# Patient Record
Sex: Male | Born: 1970 | Race: Black or African American | Hispanic: No | Smoking: Current every day smoker
Health system: Southern US, Academic
[De-identification: ages and names within clinical notes are randomized; demographics above are authoritative.]

## PROBLEM LIST (undated history)

## (undated) ENCOUNTER — Emergency Department: Disposition: A | Discharge status: Other Acute Inpatient Hospital | Payer: Medicare Other

## (undated) DIAGNOSIS — I219 Acute myocardial infarction, unspecified: Secondary | ICD-10-CM

## (undated) DIAGNOSIS — N289 Disorder of kidney and ureter, unspecified: Secondary | ICD-10-CM

## (undated) DIAGNOSIS — J45909 Unspecified asthma, uncomplicated: Secondary | ICD-10-CM

## (undated) DIAGNOSIS — R188 Other ascites: Secondary | ICD-10-CM

## (undated) DIAGNOSIS — K529 Noninfective gastroenteritis and colitis, unspecified: Principal | ICD-10-CM

## (undated) DIAGNOSIS — R079 Chest pain, unspecified: Secondary | ICD-10-CM

## (undated) DIAGNOSIS — N186 End stage renal disease: Principal | ICD-10-CM

## (undated) DIAGNOSIS — E875 Hyperkalemia: Secondary | ICD-10-CM

## (undated) DIAGNOSIS — R509 Fever, unspecified: Secondary | ICD-10-CM

## (undated) DIAGNOSIS — J810 Acute pulmonary edema: Principal | ICD-10-CM

## (undated) DIAGNOSIS — A09 Infectious gastroenteritis and colitis, unspecified: Secondary | ICD-10-CM

## (undated) DIAGNOSIS — I249 Acute ischemic heart disease, unspecified: Principal | ICD-10-CM

## (undated) DIAGNOSIS — A498 Other bacterial infections of unspecified site: Secondary | ICD-10-CM

## (undated) DIAGNOSIS — Z765 Malingerer [conscious simulation]: Secondary | ICD-10-CM

## (undated) DIAGNOSIS — A0472 Enterocolitis due to Clostridium difficile, not specified as recurrent: Secondary | ICD-10-CM

## (undated) DIAGNOSIS — G4730 Sleep apnea, unspecified: Secondary | ICD-10-CM

## (undated) DIAGNOSIS — R9430 Abnormal result of cardiovascular function study, unspecified: Secondary | ICD-10-CM

## (undated) DIAGNOSIS — Z992 Dependence on renal dialysis: Secondary | ICD-10-CM

## (undated) DIAGNOSIS — I2510 Atherosclerotic heart disease of native coronary artery without angina pectoris: Secondary | ICD-10-CM

## (undated) DIAGNOSIS — I10 Essential (primary) hypertension: Secondary | ICD-10-CM

## (undated) DIAGNOSIS — I252 Old myocardial infarction: Secondary | ICD-10-CM

## (undated) DIAGNOSIS — Z973 Presence of spectacles and contact lenses: Secondary | ICD-10-CM

## (undated) DIAGNOSIS — Z972 Presence of dental prosthetic device (complete) (partial): Secondary | ICD-10-CM

## (undated) DIAGNOSIS — N19 Unspecified kidney failure: Secondary | ICD-10-CM

## (undated) HISTORY — PX: PERITONEAL CATHETER INSERTION: SHX2223

## (undated) HISTORY — PX: OTHER SURGICAL HISTORY: SHX169

## (undated) HISTORY — PX: HERNIA REPAIR: SHX51

## (undated) HISTORY — PX: ABDOMINAL SURGERY: SHX537

## (undated) HISTORY — PX: AV FISTULA PLACEMENT: SHX1204

## (undated) HISTORY — PX: HX CORONARY STENT PLACEMENT: SHX49

## (undated) HISTORY — PX: CORONARY ARTERY ANGIOPLASTY: PR CATH30428

## (undated) HISTORY — PX: HX HERNIA REPAIR: SHX51

## (undated) HISTORY — PX: HX GALL BLADDER SURGERY/CHOLE: SHX55

## (undated) HISTORY — PX: INGUINAL HERNIA REPAIR: SUR1180

## (undated) HISTORY — PX: DIALYSIS FISTULA CREATION: SHX611

## (undated) HISTORY — PX: EXCHANGE OF A DIALYSIS CATHETER: SHX5818

## (undated) HISTORY — PX: UMBILICAL HERNIA REPAIR: SHX196

---

## 2000-03-31 ENCOUNTER — Observation Stay
Admission: EM | Admit: 2000-03-31 | Disposition: A | Discharge status: Home / Self Care | Payer: Self-pay | Source: Ambulatory Visit | Admitting: Psychiatry

## 2000-07-11 ENCOUNTER — Emergency Department: Admit: 2000-07-11 | Payer: Self-pay | Source: Emergency Department | Admitting: Emergency Medicine

## 2006-04-19 DIAGNOSIS — N19 Unspecified kidney failure: Secondary | ICD-10-CM

## 2006-04-19 HISTORY — DX: Unspecified kidney failure: N19

## 2008-04-19 DIAGNOSIS — I219 Acute myocardial infarction, unspecified: Secondary | ICD-10-CM

## 2008-04-19 HISTORY — DX: Acute myocardial infarction, unspecified: I21.9

## 2008-05-19 ENCOUNTER — Inpatient Hospital Stay
Admission: EM | Admit: 2008-05-19 | Disposition: A | Discharge status: Home / Self Care | Payer: Self-pay | Source: Emergency Department | Admitting: Internal Medicine

## 2008-05-19 LAB — CBC AND DIFFERENTIAL
Basophils Absolute: 0 /mm3 (ref 0.0–0.2)
Basophils: 0 % (ref 0–2)
Eosinophils Absolute: 0.4 /mm3 (ref 0.0–0.7)
Eosinophils: 5 % (ref 0–5)
Granulocytes Absolute: 7.5 /mm3 (ref 1.8–8.1)
Hematocrit: 39.3 % — ABNORMAL LOW (ref 42.0–52.0)
Hgb: 12.7 G/DL — ABNORMAL LOW (ref 13.0–17.0)
Immature Granulocytes Absolute: 0
Immature Granulocytes: 0 %
Lymphocytes Absolute: 1.3 /mm3 (ref 0.5–4.4)
Lymphocytes: 14 % — ABNORMAL LOW (ref 15–41)
MCH: 31.4 PG (ref 28.0–32.0)
MCHC: 32.3 G/DL (ref 32.0–36.0)
MCV: 97 FL (ref 80.0–100.0)
MPV: 11.5 FL (ref 9.4–12.3)
Monocytes Absolute: 0.4 /mm3 (ref 0.0–1.2)
Monocytes: 4 % (ref 0–11)
Neutrophils %: 77 % — ABNORMAL HIGH (ref 52–75)
Platelets: 221 /mm3 (ref 140–400)
RBC: 4.05 /mm3 — ABNORMAL LOW (ref 4.70–6.00)
RDW: 15.4 % — ABNORMAL HIGH (ref 11.5–15.0)
WBC: 9.78 /mm3 (ref 3.50–10.80)

## 2008-05-19 LAB — BASIC METABOLIC PANEL
BUN: 73 mg/dL — ABNORMAL HIGH (ref 8–20)
CO2: 11 mEq/L — CR (ref 21–30)
Calcium: 8.5 mg/dL — ABNORMAL LOW (ref 8.6–10.2)
Chloride: 119 mEq/L — ABNORMAL HIGH (ref 98–107)
Creatinine: 9.9 mg/dL — ABNORMAL HIGH (ref 0.6–1.5)
Glucose: 120 mg/dL — ABNORMAL HIGH (ref 70–100)
Potassium: 6.8 mEq/L — CR (ref 3.6–5.0)
Sodium: 141 mEq/L (ref 136–146)

## 2008-05-19 LAB — HEPATITIS B SURFACE ANTIGEN W/ REFLEX TO CONFIRMATION: Hepatitis B Surface Antigen: NEGATIVE

## 2008-05-19 LAB — B-TYPE NATRIURETIC PEPTIDE: B-Natriuretic Peptide: 325 pg/mL — ABNORMAL HIGH (ref 0–73)

## 2008-05-19 LAB — GFR

## 2008-05-19 LAB — HEPATITIS C ANTIBODY: Hepatitis C, AB: NEGATIVE

## 2008-05-20 LAB — CBC
Hematocrit: 39.2 % — ABNORMAL LOW (ref 42.0–52.0)
Hgb: 12.8 G/DL — ABNORMAL LOW (ref 13.0–17.0)
MCH: 30.6 PG (ref 28.0–32.0)
MCHC: 32.7 G/DL (ref 32.0–36.0)
MCV: 93.8 FL (ref 80.0–100.0)
MPV: 11.3 FL (ref 9.4–12.3)
Platelets: 202 /mm3 (ref 140–400)
RBC: 4.18 /mm3 — ABNORMAL LOW (ref 4.70–6.00)
RDW: 15.4 % — ABNORMAL HIGH (ref 11.5–15.0)
WBC: 12.65 /mm3 — ABNORMAL HIGH (ref 3.50–10.80)

## 2008-05-20 LAB — BASIC METABOLIC PANEL
BUN: 61 mg/dL — ABNORMAL HIGH (ref 8–20)
CO2: 20 mEq/L — ABNORMAL LOW (ref 21–30)
Calcium: 8.6 mg/dL (ref 8.6–10.2)
Chloride: 105 mEq/L (ref 98–107)
Creatinine: 8.1 mg/dL — ABNORMAL HIGH (ref 0.6–1.5)
Glucose: 88 mg/dL (ref 70–100)
Potassium: 4.3 mEq/L (ref 3.6–5.0)
Sodium: 139 mEq/L (ref 136–146)

## 2008-05-20 LAB — PHOSPHORUS: Phosphorus: 7.9 mg/dL — ABNORMAL HIGH (ref 2.5–4.5)

## 2008-05-20 LAB — MAGNESIUM: Magnesium: 1.6 mg/dL (ref 1.6–2.3)

## 2008-05-20 LAB — RPR: RPR: NONREACTIVE

## 2008-05-20 LAB — HEMOGLOBIN A1C: Hemoglobin A1C: 5.1 % (ref ?–6.0)

## 2008-05-21 LAB — CBC
Hematocrit: 37.9 % — ABNORMAL LOW (ref 42.0–52.0)
Hgb: 12.4 G/DL — ABNORMAL LOW (ref 13.0–17.0)
MCH: 30.8 PG (ref 28.0–32.0)
MCHC: 32.7 G/DL (ref 32.0–36.0)
MCV: 94 FL (ref 80.0–100.0)
MPV: 11.3 FL (ref 9.4–12.3)
Platelets: 154 /mm3 (ref 140–400)
RBC: 4.03 /mm3 — ABNORMAL LOW (ref 4.70–6.00)
RDW: 15.2 % — ABNORMAL HIGH (ref 11.5–15.0)
WBC: 9.34 /mm3 (ref 3.50–10.80)

## 2008-05-21 LAB — LIPID PANEL
Cholesterol: 171 mg/dL (ref 50–200)
HDL: 29 mg/dL (ref 29–67)
LDL Calculated: 90 mg/dL (ref 0–130)
Triglycerides: 258 mg/dL — ABNORMAL HIGH (ref 40–160)
VLDL Calculated: 52 mg/dL — ABNORMAL HIGH (ref 10–40)

## 2008-05-21 LAB — PHOSPHORUS: Phosphorus: 5.8 mg/dL — ABNORMAL HIGH (ref 2.5–4.5)

## 2008-05-21 LAB — BASIC METABOLIC PANEL
BUN: 43 mg/dL — ABNORMAL HIGH (ref 8–20)
CO2: 28 mEq/L (ref 21–30)
Calcium: 8.2 mg/dL — ABNORMAL LOW (ref 8.6–10.2)
Chloride: 98 mEq/L (ref 98–107)
Creatinine: 6.1 mg/dL — ABNORMAL HIGH (ref 0.6–1.5)
Glucose: 135 mg/dL — ABNORMAL HIGH (ref 70–100)
Potassium: 3.5 mEq/L — ABNORMAL LOW (ref 3.6–5.0)
Sodium: 138 mEq/L (ref 136–146)

## 2008-05-21 LAB — CH/HDL: Cholesterol / HDL Ratio: 5.9 RATIO

## 2008-05-21 LAB — HEPATITIS B DNA ULTRAQUANTITATIVE, PCR

## 2008-05-21 LAB — GFR

## 2008-05-21 LAB — MAGNESIUM: Magnesium: 1.6 mg/dL (ref 1.6–2.3)

## 2011-01-08 LAB — ECG 12-LEAD
Atrial Rate: 73 {beats}/min
P Axis: 72 degrees
P-R Interval: 160 ms
Q-T Interval: 386 ms
QRS Duration: 70 ms
QTC Calculation (Bezet): 425 ms
R Axis: -4 degrees
T Axis: 86 degrees
Ventricular Rate: 73 {beats}/min

## 2011-02-05 NOTE — Consults (Signed)
Account Number: [id number]      Document ID: [id number]      Admit Date: 05/19/2008      Service Date: 05/19/2008            Patient Location: [facility]      Patient Type: I            CONSULTING PHYSICIAN: Gentry Roch MD            REFERRING PHYSICIAN: Mcneil Sober MD                  REASON FOR CONSULTATION:      Hyperkalemia.            HISTORY OF PRESENT ILLNESS:      The patient is a 40 year old gentleman with end-stage renal disease, on      Sunday, Tuesday, Thursday nocturnal hemodialysis at [facility], followed      by Dr. Sherrill Raring, who presents to the emergency department after missing 2      weeks of hemodialysis.  He states he got busy at work and was unable to      make his regularly scheduled shifts.  He does drive up for each treatment      from [place], where he lives.  He has noted some difficulty      breathing, primarily with exertion, over the last several days.  He has      otherwise been feeling in his usual state of health, however.  He denies      any headache or change in vision.  He denies any chest discomfort.  He does      still make urine and has not noted any significant changes in his urinary      habits recently.  On initial evaluation in the emergency department, he was      noted to have a markedly elevated blood pressure as well as a notable      hyperkalemia and metabolic acidosis.  He was treated with medical therapy,      and we are consulted now for evaluation and management regarding his      hyperkalemia in the setting as described.  The patient is currently without      any acute complaints.            REVIEW OF SYSTEMS:      Pertinent positives and negatives as above, otherwise 10 systems reviewed      and negative.            PAST MEDICAL HISTORY:      1.  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      2.  Hypertension.      3.  Asthma.      4.  Tobacco abuse.            PAST SURGICAL HISTORY:      Left upper extremity  brachiobasilic AV fistula.            SOCIAL HISTORY:      He smokes half a pack of cigarettes daily, denies alcohol or illicit drug      use.  He has multiple tattoos.  He lives and works in [place].            FAMILY HISTORY:      Denies any known history of renal disease.            ALLERGIES:  No known drug allergies.            OUTPATIENT MEDICATIONS:      Albuterol, atenolol, lisinopril.            PHYSICAL EXAMINATION:      VITAL SIGNS:  Blood pressure 175/113, pulse 88, respirations 18,      temperature 97.4, O2 sat 98% on room air.      GENERAL:  He is alert and oriented and in no acute distress.      HEENT:  Oropharynx is clear.  Mucous membranes are moist.  Sclerae are      anicteric.  Conjunctivae are pink.      NECK:  Supple, mild JVD.  No lymphadenopathy.      CARDIOVASCULAR:  Regular rate and rhythm, normal S1, S2.      RESPIRATORY:  Some rales at the bases bilaterally, otherwise clear.      GI/ABDOMEN:  Soft, nontender with normal bowel sounds.      SKIN:  Warm, dry, and without rash, multiple tattoos noted.      EXTREMITIES:  Reveal 1+ bilateral lower extremity edema, he has a left      upper extremity brachial basilic fistula with an excellent thrill.      NEUROLOGIC:  He is alert and oriented x3.  He has no gross motor or sensory      deficits.            LABORATORY DATA:      WBC 9.7, hemoglobin 12.7, hematocrit 39.3, platelets 221, sodium 141,      potassium 6.8, chloride 119, bicarbonate 11, BUN 73, creatinine 9.9,      glucose 120, calcium 8.5, BNP 325.            ASSESSMENT:      1.  Hyperkalemia:  Secondary to dialysis noncompliance, also some      contribution from metabolic acidosis and ACE inhibitor therapy.  Will plan      for urgent hemodialysis now with a 0 potassium bath x 1.5 hours.  Will      then perform a full hemodialysis on May 20, 2008, after which he can      resume his regularly scheduled Tuesday, Thursday, Sunday nocturnal      hemodialysis as an outpatient,  if there are no other medical issues      warranting further hospitalization.      2.  Metabolic acidosis:  Secondary to missed hemodialysis, plan as above.      3.  Hypertension:  Secondary to volume overload, will perform      ultrafiltration with dialysis.      4.  End-stage renal disease.            RECOMMENDATIONS:      1.  Hemodialysis now with a 0 potassium bath x 1.5 hours.      2.  Hemodialysis on May 20, 2008.      3.  Resume outpatient blood pressure medications tomorrow.      4.  Likely will be stable for discharge after hemodialysis tomorrow.            Thank you very much for this consultation.  We will follow the patient      closely with you.                        Electronic Signing Provider      _______________________________     Date/Time  Signed: _____________      Gentry Roch MD ([id number])            D:  05/19/2008 18:25 PM by Dr. Gentry Roch, MD ([id number])      T:  05/20/2008 09:09 AM by BP          (Conf: [id number]) (Doc ID: [id number])                  [id number] Skylan Lara MD      Mcneil Sober MD

## 2011-02-05 NOTE — Discharge Summary (Unsigned)
Account Number: [id number]      Document ID: [id number]      Admit Date: 05/19/2008      Discharge Date: 05/21/2008            ATTENDING PHYSICIAN:  Mcneil Sober, MD                  CHIEF COMPLAINT:      Missing dialysis.            HISTORY OF PRESENT ILLNESS:      The patient is a 40 year old male with a history of end-stage renal disease      on hemodialysis since April 2009,  takes dialysis on "Sunday, Tuesday, and      Thursday. He does go to the nocturnal hemodialysis center at [facility]. He presented to the emergency room with the complaint of increasing      shortness of breath and was also complaining about mild chest pain. The      patient reports missing dialysis treatment for the past two weeks because      he was working and was not able to get time off for dialysis. He denied any      other complaints except that he was not feeling 100% quite right, otherwise      denied any significant problems.            PAST MEDICAL HISTORY:      End-stage renal disease diagnosed in April 2009. No biopsy confirmed,      hypertension and history of asthma.            PHYSICAL EXAMINATION:      VITALS:   Blood pressure 156/98, pulse 84, temperature  97.4, respiratory      rate 18.      LUNGS:  Clear to auscultation bilaterally.      HEART:  Regular.  No murmurs, rubs, or gallops.      ABDOMEN:  Soft, nontender, nondistended.   There was no lower extremity      edema.      NEURO:  Grossly intact.            LABORATORY DATA:      White cell count 9.78, H and H of 12.7/39.3 and platelet count 221.  BNP      325.  Chemistry was significant for sodium 141, potassium 6.8, chloride      11" 9, bicarbonate 11, bun 73, creatinine 9.9.            HOSPITAL COURSE:      The patient was admitted to the [facility] for immediate dialysis. Nephrology was      consulted on the day of admission, and the patient was given dialysis daily      for the next two days.  He received dialysis on admission and then was      subsequently dialyzed the  following day.  His hyperkalemia resolved with      both dialysis and Kayexalate.  The patient complained of some mild headache      after dialysis and after the first dialysis received Percocet, and after      the second dialysis received a small dose of Dilaudid IV.  His blood      pressure was significantly difficult to control throughout his      hospitalization. He was continued on his home medications of Atenolol 50 mg      p.o. b.i.d., his Lisinopril  was held until after his hyperkalemia resolved.      He was also started on Cardura 2 mg p.o. q. h.s. and PhosLo for his      elevated phosphorus.  The patient was told to continue to followup with his      outpatient nephrologist and continue dialysis as regularly scheduled.  The      patient is followed at hemodialysis at _____ [facility] by Dr. Sherrill Raring and      was suppose to continue having dialysis scheduled Sunday, Tuesday, and      Thursday.            DISCHARGE MEDICATIONS:      1. Atenolol 50 mg p.o. q. 12.      2. PhosLo 657 mg p.o. t.i.d. with meals.      3. Cardura 2 mg p.o. q.h.s.      4. Lisinopril 10 mg p.o. daily.            The patient was given a Nicotine Patch to help with smoking cessation.                              _______________________________     Date/Time Signed: _____________      Mcneil Sober MD ([id number])            D:  05/22/2008 11:10 AM by Cherylann Banas, MD ([id number])      T:  05/22/2008 13:38 PM by Minda Meo          (Conf: [id number]) (Doc ID: [id number])                        cc:

## 2011-02-05 NOTE — H&P (Unsigned)
Account Number: [id number]      Document ID: [id number]      Admit Date: 05/19/2008            Patient Location: [facility]      Patient Type: I            ATTENDING PHYSICIAN: Mcneil Sober, MD                  HISTORY OF PRESENT ILLNESS:      The patient is a 40 year old male.  He has end-stage renal disease and he      takes dialysis on "Sunday, Tuesday, and Thursday.  He goes to the nocturnal      hemodialysis center at the [facility].  He came in the emergency room      with a complaint of increasing shortness of breath and he was also      complaining about the chest pain.  He has missed 2 dialysis treatments      because he was working.  He could not get time off for the dialysis.  He      was very short of breath, so he was admitted for further workup.  Labs were      drawn which showed that his creatinine was 9.9 and potassium was 6.8, so      patient was admitted to [facility] for further workup.  Nephrology was consulted.            PAST MEDICAL HISTORY:      End-stage renal disease diagnosed in April 2009, hypertension, and history      of asthma.            FAMILY HISTORY:      Not available.            SOCIAL HISTORY:      He does smoke cigarettes.  He has a 11-pack-per-year smoking history.  He      does not drink alcohol.            REVIEW OF SYSTEMS:      Per HPI.            MEDICATIONS:      He takes Zestril and metoprolol for blood pressure.            PHYSICAL EXAMINATION:      VITAL SIGNS:  His blood pressure 156/98, pulse is 84 regular, temperature      97" .4.      HEENT:  Within normal limits.      LUNGS:  Clear to auscultation bilaterally.      CARDIOVASCULAR:  S1, S2 normal intensity, no murmurs, no rubs, no gallops.      ABDOMEN:  Soft, nontender, normoactive bowel sounds.      EXTREMITIES:  No edema.      NEUROLOGIC:  Grossly intact.            LABORATORY DATA:      Labs were  drawn which shows white cell count of 9.78, hemoglobin 12.7,      hematocrit 39.3, platelet count was 221.  BNP was 325.  Sodium  141,      potassium was 6.8, chloride was 119, CO2 was 11, BUN 73, creatinine is 9.9.            ASSESSMENT:      The patient is a 40 year old male who has end-stage renal disease on      hemodialysis, hypertension, admitted due to chest pain.  PLAN:      We will give him Kayexalate p.o. b.i.d.  We will get a nephrology consult.      Patient should be dialyzed as soon as possible.  We will also get an      echocardiogram, lipid profile.  We are going to continue all his      medication.  We will repeat CPK and troponin on him.  The rest of the plan      is per order.  Smoking cessation.                              _______________________________     Date/Time Signed: _____________      Mcneil Sober MD ([id number])            D:  05/20/2008 11:52 AM by Dr. Mcneil Sober, MD ([id number])      T:  05/20/2008 12:43 PM by [id number]          Everlean Cherry: [id number]) (Doc ID: [id number])                  UU:VOZD Bennie Hind MD

## 2013-11-05 DIAGNOSIS — I2510 Atherosclerotic heart disease of native coronary artery without angina pectoris: Secondary | ICD-10-CM

## 2013-11-05 DIAGNOSIS — N189 Chronic kidney disease, unspecified: Secondary | ICD-10-CM

## 2013-11-05 DIAGNOSIS — R079 Chest pain, unspecified: Secondary | ICD-10-CM

## 2013-11-16 ENCOUNTER — Observation Stay
Admission: EM | Admit: 2013-11-16 | Discharge: 2013-11-17 | Disposition: A | Discharge status: Home / Self Care | Payer: Medicare Other | Attending: Trauma Surgery | Admitting: Trauma Surgery

## 2013-11-16 ENCOUNTER — Observation Stay: Payer: Medicare Other | Admitting: Trauma Surgery

## 2013-11-16 DIAGNOSIS — S0300XA Dislocation of jaw, unspecified side, initial encounter: Secondary | ICD-10-CM | POA: Insufficient documentation

## 2013-11-16 DIAGNOSIS — F17200 Nicotine dependence, unspecified, uncomplicated: Secondary | ICD-10-CM | POA: Insufficient documentation

## 2013-11-16 DIAGNOSIS — G8911 Acute pain due to trauma: Principal | ICD-10-CM | POA: Insufficient documentation

## 2013-11-16 DIAGNOSIS — E8779 Other fluid overload: Secondary | ICD-10-CM | POA: Insufficient documentation

## 2013-11-16 DIAGNOSIS — N039 Chronic nephritic syndrome with unspecified morphologic changes: Secondary | ICD-10-CM | POA: Insufficient documentation

## 2013-11-16 DIAGNOSIS — S27329A Contusion of lung, unspecified, initial encounter: Secondary | ICD-10-CM | POA: Insufficient documentation

## 2013-11-16 DIAGNOSIS — D631 Anemia in chronic kidney disease: Secondary | ICD-10-CM | POA: Insufficient documentation

## 2013-11-16 DIAGNOSIS — I10 Essential (primary) hypertension: Secondary | ICD-10-CM | POA: Diagnosis present

## 2013-11-16 DIAGNOSIS — N186 End stage renal disease: Secondary | ICD-10-CM | POA: Insufficient documentation

## 2013-11-16 DIAGNOSIS — J45909 Unspecified asthma, uncomplicated: Secondary | ICD-10-CM | POA: Insufficient documentation

## 2013-11-16 DIAGNOSIS — Z992 Dependence on renal dialysis: Secondary | ICD-10-CM | POA: Insufficient documentation

## 2013-11-16 DIAGNOSIS — M2669 Other specified disorders of temporomandibular joint: Secondary | ICD-10-CM | POA: Insufficient documentation

## 2013-11-16 DIAGNOSIS — I120 Hypertensive chronic kidney disease with stage 5 chronic kidney disease or end stage renal disease: Secondary | ICD-10-CM | POA: Insufficient documentation

## 2013-11-16 DIAGNOSIS — R071 Chest pain on breathing: Secondary | ICD-10-CM | POA: Insufficient documentation

## 2013-11-16 DIAGNOSIS — R1033 Periumbilical pain: Secondary | ICD-10-CM | POA: Insufficient documentation

## 2013-11-16 HISTORY — DX: Essential (primary) hypertension: I10

## 2013-11-16 HISTORY — DX: Unspecified asthma, uncomplicated: J45.909

## 2013-11-16 NOTE — ED Notes (Signed)
Bed: N 37  Expected date:   Expected time:   Means of arrival: [facility]  Comments:  Jesus, [place]

## 2013-11-17 ENCOUNTER — Observation Stay: Payer: Medicare Other

## 2013-11-17 ENCOUNTER — Emergency Department: Payer: Medicare Other

## 2013-11-17 ENCOUNTER — Other Ambulatory Visit: Payer: Self-pay

## 2013-11-17 DIAGNOSIS — N186 End stage renal disease: Secondary | ICD-10-CM

## 2013-11-17 DIAGNOSIS — G8911 Acute pain due to trauma: Secondary | ICD-10-CM | POA: Diagnosis present

## 2013-11-17 DIAGNOSIS — S27329A Contusion of lung, unspecified, initial encounter: Secondary | ICD-10-CM | POA: Diagnosis present

## 2013-11-17 DIAGNOSIS — I10 Essential (primary) hypertension: Secondary | ICD-10-CM | POA: Diagnosis present

## 2013-11-17 DIAGNOSIS — Z992 Dependence on renal dialysis: Secondary | ICD-10-CM

## 2013-11-17 LAB — BASIC METABOLIC PANEL
BUN: 89 mg/dL — ABNORMAL HIGH (ref 9.0–28.0)
CO2: 15 mEq/L — ABNORMAL LOW (ref 22–29)
Calcium: 7.9 mg/dL — ABNORMAL LOW (ref 8.5–10.5)
Chloride: 102 mEq/L (ref 100–111)
Creatinine: 14.5 mg/dL — ABNORMAL HIGH (ref 0.7–1.3)
Glucose: 106 mg/dL — ABNORMAL HIGH (ref 70–100)
Potassium: 6.1 mEq/L (ref 3.5–5.1)
Sodium: 135 mEq/L — ABNORMAL LOW (ref 136–145)

## 2013-11-17 LAB — HEMOLYSIS INDEX: Hemolysis Index: 6 (ref 0–18)

## 2013-11-17 LAB — CBC
Hematocrit: 27.4 % — ABNORMAL LOW (ref 42.0–52.0)
Hgb: 8.3 g/dL — ABNORMAL LOW (ref 13.0–17.0)
MCH: 30.9 pg (ref 28.0–32.0)
MCHC: 30.3 g/dL — ABNORMAL LOW (ref 32.0–36.0)
MCV: 101.9 fL — ABNORMAL HIGH (ref 80.0–100.0)
MPV: 11.7 fL (ref 9.4–12.3)
Nucleated RBC: 0 /100 WBC (ref 0–1)
Platelets: 137 10*3/uL — ABNORMAL LOW (ref 140–400)
RBC: 2.69 10*6/uL — ABNORMAL LOW (ref 4.70–6.00)
RDW: 19 % — ABNORMAL HIGH (ref 12–15)
WBC: 6.4 10*3/uL (ref 3.50–10.80)

## 2013-11-17 LAB — GFR: EGFR: 4.5

## 2013-11-17 LAB — HEPATITIS B SURFACE ANTIBODY: HEPATITIS B SURFACE ANTIBODY: <8

## 2013-11-17 LAB — HEPATITIS B SURFACE ANTIGEN W/ REFLEX TO CONFIRMATION: Hepatitis B Surface Antigen: NONREACTIVE

## 2013-11-17 MED ORDER — ALBUTEROL SULFATE 1.25 MG/3ML IN NEBU
1.2500 mg | INHALATION_SOLUTION | Freq: Four times a day (QID) | RESPIRATORY_TRACT | Status: DC
Start: 2013-11-17 — End: 2013-11-17
  Administered 2013-11-17: 1.25 mg via RESPIRATORY_TRACT
  Filled 2013-11-17: qty 3

## 2013-11-17 MED ORDER — OXYCODONE HCL 5 MG PO TABS
5.0000 mg | ORAL_TABLET | ORAL | Status: DC | PRN
Start: 2013-11-17 — End: 2015-08-25

## 2013-11-17 MED ORDER — CARVEDILOL 25 MG PO TABS
25.0000 mg | ORAL_TABLET | Freq: Two times a day (BID) | ORAL | Status: DC
Start: 2013-11-17 — End: 2013-11-17

## 2013-11-17 MED ORDER — AMLODIPINE BESYLATE 5 MG PO TABS
10.0000 mg | ORAL_TABLET | Freq: Every day | ORAL | Status: DC
Start: 2013-11-17 — End: 2013-11-17

## 2013-11-17 MED ORDER — ACETAMINOPHEN 500 MG PO TABS
1000.0000 mg | ORAL_TABLET | Freq: Four times a day (QID) | ORAL | Status: DC
Start: 2013-11-17 — End: 2013-11-17
  Administered 2013-11-17: 1000 mg via ORAL
  Filled 2013-11-17 (×2): qty 2

## 2013-11-17 MED ORDER — HEPARIN SODIUM (PORCINE) 5000 UNIT/ML IJ SOLN
5000.0000 [IU] | Freq: Three times a day (TID) | INTRAMUSCULAR | Status: DC
Start: 2013-11-17 — End: 2013-11-17

## 2013-11-17 MED ORDER — OXYCODONE HCL 5 MG PO TABS
5.0000 mg | ORAL_TABLET | ORAL | Status: DC | PRN
Start: 2013-11-17 — End: 2013-11-17

## 2013-11-17 MED ORDER — SODIUM CHLORIDE 0.9 % IV BOLUS
100.0000 mL | INTRAVENOUS | Status: DC | PRN
Start: 2013-11-17 — End: 2013-11-17

## 2013-11-17 MED ORDER — SEVELAMER CARBONATE 800 MG PO TABS
800.0000 mg | ORAL_TABLET | Freq: Three times a day (TID) | ORAL | Status: DC
Start: 2013-11-17 — End: 2013-11-17

## 2013-11-17 MED ORDER — LIDOCAINE 5 % EX PTCH
1.0000 | MEDICATED_PATCH | CUTANEOUS | Status: DC
Start: 2013-11-17 — End: 2013-11-17
  Administered 2013-11-17: 1 via TRANSDERMAL
  Filled 2013-11-17: qty 1

## 2013-11-17 MED ORDER — NALOXONE HCL 0.4 MG/ML IJ SOLN
0.2000 mg | INTRAMUSCULAR | Status: DC | PRN
Start: 2013-11-17 — End: 2013-11-17

## 2013-11-17 MED ORDER — SODIUM CHLORIDE 0.9 % IV BOLUS
250.0000 mL | INTRAVENOUS | Status: DC | PRN
Start: 2013-11-17 — End: 2013-11-17

## 2013-11-17 MED ORDER — KETOROLAC TROMETHAMINE 30 MG/ML IJ SOLN
15.0000 mg | Freq: Four times a day (QID) | INTRAMUSCULAR | Status: DC
Start: 2013-11-17 — End: 2013-11-17
  Filled 2013-11-17: qty 1

## 2013-11-17 MED ORDER — HYDROMORPHONE HCL PF 1 MG/ML IJ SOLN
0.4000 mg | INTRAMUSCULAR | Status: DC | PRN
Start: 2013-11-17 — End: 2013-11-17
  Administered 2013-11-17 (×3): 0.4 mg via INTRAVENOUS
  Filled 2013-11-17 (×3): qty 1

## 2013-11-17 MED ORDER — ACETAMINOPHEN 500 MG PO TABS
1000.0000 mg | ORAL_TABLET | Freq: Four times a day (QID) | ORAL | Status: DC
Start: 2013-11-17 — End: 2015-08-25

## 2013-11-17 MED ORDER — GABAPENTIN 100 MG PO CAPS
100.0000 mg | ORAL_CAPSULE | Freq: Three times a day (TID) | ORAL | Status: DC
Start: 2013-11-17 — End: 2013-11-17
  Administered 2013-11-17: 100 mg via ORAL
  Filled 2013-11-17: qty 1

## 2013-11-17 MED ORDER — LISINOPRIL 5 MG PO TABS
2.5000 mg | ORAL_TABLET | Freq: Three times a day (TID) | ORAL | Status: DC
Start: 2013-11-17 — End: 2013-11-17

## 2013-11-17 MED ORDER — HYDROMORPHONE HCL PF 1 MG/ML IJ SOLN
1.0000 mg | Freq: Once | INTRAMUSCULAR | Status: AC
Start: 2013-11-17 — End: 2013-11-17
  Administered 2013-11-17: 1 mg via INTRAVENOUS
  Filled 2013-11-17 (×2): qty 1

## 2013-11-17 MED ORDER — HYDRALAZINE HCL 25 MG PO TABS
100.0000 mg | ORAL_TABLET | Freq: Three times a day (TID) | ORAL | Status: DC
Start: 2013-11-17 — End: 2013-11-17

## 2013-11-17 MED ORDER — OXYCODONE HCL 5 MG PO TABS
10.0000 mg | ORAL_TABLET | ORAL | Status: DC | PRN
Start: 2013-11-17 — End: 2013-11-17
  Administered 2013-11-17: 10 mg via ORAL
  Filled 2013-11-17 (×2): qty 2

## 2013-11-17 MED ORDER — FUROSEMIDE 40 MG PO TABS
80.0000 mg | ORAL_TABLET | Freq: Every day | ORAL | Status: DC
Start: 2013-11-17 — End: 2013-11-17

## 2013-11-17 NOTE — Procedures (Signed)
Pt requested to come-off after an 1.5 hrs HD ( original order of 3.5) on 2.0K/2.5 Ca bath on 1.2 liters fluid off. Supervision/ reassurance given with no effect AMA form signed, MD informed BP stable though ran slightly elevated during tx ( see trends on flowsheet). No access issues.

## 2013-11-17 NOTE — Progress Notes (Signed)
Informed the Resident [phone] re: Patient asking for schedule dialysis today.
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
## 2013-11-17 NOTE — Progress Notes (Signed)
Pt received from ED via strecher  approx. 0416 am.Alert and oriented x 4.Sob upon exertion  Noted.Maintained on oxygen at 5 lpm via nc.Remote tele in place,SR.Dialysis fistula at LUE intact,+trill and bruit sound.Lidoderm patch to right chest in place.Pt reported pain 10 out 10 upon admission,refusing oxycodone and states allergy to toradol  ,pt requesting dilaudid.Called and spoke with surgery on call,Dr Ngo with order noted for dilaudid 0.4 q 2 hrs prn,given at 0444am,toradol discontinued.Pt instructed on IS usage,verbalizes understanding.

## 2013-11-17 NOTE — Progress Notes (Deleted)
Trauma R4     Patient will miss regularly scheduled dialysis, will consult nephrology for HD.  [place] IV pain medications.  Will obtain EKG.   If pain improved will plan to [place] home this PM.     Agnes Lawrence, MD  [facility]   General Surgery R4

## 2013-11-17 NOTE — Progress Notes (Signed)
Informed Resident [name] re: result of EKG and the Dialysis was ordered.
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
## 2013-11-17 NOTE — Discharge Instructions (Signed)
PLEASE FOLLOW UP WITH YOUR PCP AND NEPHROLOGIST     You do not need to come to trauma clinic unless issues arise.           Discharge Instructions and Follow Up Information:  - You have been discharged from [facility]. Your primary physician team during your stay was the Acute Care Surgery (ACS) service, whose surgeons specialize in General Surgery, Trauma Surgery, and Critical Care. Our surgeons are Board Certified in both General Surgery and Surgical Critical Care.    For Questions or Problems:  - If at any time you have questions or concerns about your medical care, please call the Trauma Services Office at [phone]. After 4:30 pm and on weekends/holidays, for urgent issues you may reach the Acute Care Surgeon on-call by calling the hospital operator at [phone].    To make a Clinic appointment:  call [phone]    Other clinic information:  - The [facility] ("the clinic") is where the ACS physicians see outpatients and hospital patients who need more follow-up after discharge.   The address is [address].  - At your Clinic visit, you will see one of the ACS surgeons, and possibly also a fellow, resident, physician assistant, nurse practitioner, or medical student. We understand that you may have established a relationship with one or more particular surgeons while in the hospital. However, because of scheduling logistics, it is often not possible for you to see the same surgeon who cared for you in the hospital when you come to the Clinic. Please be assured that we will make every effort to make your care after the hospital as seamless as possible. If you have had surgery by one of our surgeons, and additional surgery is planned in the future, that operation will usually be scheduled with your original surgeon unless you request otherwise.    About your Pain Medication:  Treating the pain that resulted from your injury or surgery is important to [place]. The  amount of pain medicine required varies depending on your kind of surgery or injury, and individual tolerance of pain. The ACS service can manage your general postoperative or post-trauma pain. We are unable to refill pain medication at night or on weekends, so if you think you need more, we ask that you make a good effort to predict when you will run out of pain medications, and call our office during business hours, preferably before 2pm, at least 24-48 hours before you will run out.  If your pain is from injuries or surgery that was handled by another surgery team or consultant, such as Orthopedics, Neurosurgery, or Spine surgery, we kindly request that you call that surgeon's office to address your pain. Since we do not specialize in those areas of your care, we want to make sure that your symptoms are addressed adequately.   In some cases, your pain management needs may extend beyond the acute time period when your injuries are healing. In such cases, your surgeon may advise that you need chronic or ongoing pain management. We are unable to provide this kind of medical care. Such ongoing care may be done by your [facility] Medicine physician if you have one, or by a Pain Specialist. We can provide you with a list of Pain Specialists in the [facility] area.    If you have forms that need a doctor's signature:  - If you have insurance forms, FMLA forms, or worker's compensation forms that need to  be filled out by your surgeon, please mail or fax the forms to our office at the following address:    Trauma Services  [address]  [place], [place] [address]  Fax: [phone]  Phone: [phone]

## 2013-11-17 NOTE — Progress Notes (Signed)
Unable to complete med rec,pt unable to recall doses of some meds.
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
## 2013-11-17 NOTE — Progress Notes (Signed)
Patient refused to take all scheduled medication, once he got at the unit from the dialysis, asked for Bruce paper and prescription and wants to leave right away. Patient discharge home, discharge IV, Patient verbalized understanding of discharge instructions and side effects of medications. Left unit with all belongings and with stable condition.

## 2013-11-17 NOTE — Progress Notes (Signed)
Spoke with [facility] for stat EKG.
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
## 2013-11-19 LAB — ECG 12-LEAD
Atrial Rate: 76 {beats}/min
P Axis: 70 degrees
P-R Interval: 184 ms
Q-T Interval: 428 ms
QRS Duration: 80 ms
QTC Calculation (Bezet): 481 ms
R Axis: 4 degrees
T Axis: 68 degrees
Ventricular Rate: 76 {beats}/min

## 2014-02-27 ENCOUNTER — Ambulatory Visit (INDEPENDENT_AMBULATORY_CARE_PROVIDER_SITE_OTHER): Payer: Medicare Other | Admitting: Specialist

## 2014-03-16 DIAGNOSIS — T82318A Breakdown (mechanical) of other vascular grafts, initial encounter: Secondary | ICD-10-CM

## 2014-03-18 ENCOUNTER — Encounter (INDEPENDENT_AMBULATORY_CARE_PROVIDER_SITE_OTHER): Payer: Self-pay | Admitting: Specialist

## 2014-04-19 HISTORY — PX: INGUINAL HERNIA REPAIR: SUR1180

## 2014-04-19 HISTORY — PX: HERNIA REPAIR: SHX51

## 2014-04-19 HISTORY — PX: REPAIR, UMBILICAL HERNIA: SHX5330

## 2014-09-11 DIAGNOSIS — R079 Chest pain, unspecified: Secondary | ICD-10-CM

## 2014-09-11 DIAGNOSIS — I10 Essential (primary) hypertension: Secondary | ICD-10-CM

## 2014-09-11 DIAGNOSIS — N186 End stage renal disease: Secondary | ICD-10-CM

## 2015-04-03 ENCOUNTER — Telehealth: Payer: Self-pay

## 2015-04-18 ENCOUNTER — Institutional Professional Consult (permissible substitution): Payer: Medicare Other | Admitting: Transplant

## 2015-08-25 ENCOUNTER — Emergency Department: Payer: Medicare Other

## 2015-08-25 ENCOUNTER — Emergency Department
Admission: EM | Admit: 2015-08-25 | Discharge: 2015-08-25 | Discharge status: Against Medical Advice | Payer: Medicare Other | Attending: Emergency Medicine | Admitting: Emergency Medicine

## 2015-08-25 DIAGNOSIS — F17200 Nicotine dependence, unspecified, uncomplicated: Secondary | ICD-10-CM | POA: Insufficient documentation

## 2015-08-25 DIAGNOSIS — I120 Hypertensive chronic kidney disease with stage 5 chronic kidney disease or end stage renal disease: Secondary | ICD-10-CM | POA: Insufficient documentation

## 2015-08-25 DIAGNOSIS — E875 Hyperkalemia: Secondary | ICD-10-CM

## 2015-08-25 DIAGNOSIS — J45909 Unspecified asthma, uncomplicated: Secondary | ICD-10-CM | POA: Insufficient documentation

## 2015-08-25 DIAGNOSIS — R197 Diarrhea, unspecified: Secondary | ICD-10-CM

## 2015-08-25 DIAGNOSIS — Z79899 Other long term (current) drug therapy: Secondary | ICD-10-CM | POA: Insufficient documentation

## 2015-08-25 DIAGNOSIS — A09 Infectious gastroenteritis and colitis, unspecified: Secondary | ICD-10-CM

## 2015-08-25 DIAGNOSIS — N186 End stage renal disease: Secondary | ICD-10-CM | POA: Insufficient documentation

## 2015-08-25 DIAGNOSIS — Z992 Dependence on renal dialysis: Secondary | ICD-10-CM | POA: Insufficient documentation

## 2015-08-25 HISTORY — DX: Enterocolitis due to Clostridium difficile, not specified as recurrent: A04.72

## 2015-08-25 LAB — COMPREHENSIVE METABOLIC PANEL
ALT: 13 U/L (ref 0–55)
AST (SGOT): 17 U/L (ref 5–34)
Albumin/Globulin Ratio: 0.9 (ref 0.9–2.2)
Albumin: 2.6 g/dL — ABNORMAL LOW (ref 3.5–5.0)
Alkaline Phosphatase: 218 U/L — ABNORMAL HIGH (ref 38–106)
Anion Gap: 14 (ref 5.0–15.0)
BUN: 84 mg/dL — ABNORMAL HIGH (ref 9.0–28.0)
Bilirubin, Total: 0.6 mg/dL (ref 0.2–1.2)
CO2: 20 mEq/L — ABNORMAL LOW (ref 22–29)
Calcium: 6.7 mg/dL — ABNORMAL LOW (ref 8.5–10.5)
Chloride: 105 mEq/L (ref 100–111)
Creatinine: 12.9 mg/dL — ABNORMAL HIGH (ref 0.7–1.3)
Globulin: 3 g/dL (ref 2.0–3.6)
Glucose: 92 mg/dL (ref 70–100)
Potassium: 5.5 mEq/L — ABNORMAL HIGH (ref 3.5–5.1)
Protein, Total: 5.6 g/dL — ABNORMAL LOW (ref 6.0–8.3)
Sodium: 139 mEq/L (ref 136–145)

## 2015-08-25 LAB — CBC AND DIFFERENTIAL
Basophils Absolute Automated: 0.02 10*3/uL (ref 0.00–0.20)
Basophils Automated: 0 %
Eosinophils Absolute Automated: 0.42 10*3/uL (ref 0.00–0.70)
Eosinophils Automated: 7 %
Hematocrit: 24.7 % — ABNORMAL LOW (ref 42.0–52.0)
Hgb: 7.7 g/dL — ABNORMAL LOW (ref 13.0–17.0)
Immature Granulocytes Absolute: 0.01 10*3/uL
Immature Granulocytes: 0 %
Lymphocytes Absolute Automated: 0.57 10*3/uL (ref 0.50–4.40)
Lymphocytes Automated: 10 %
MCH: 29.7 pg (ref 28.0–32.0)
MCHC: 31.2 g/dL — ABNORMAL LOW (ref 32.0–36.0)
MCV: 95.4 fL (ref 80.0–100.0)
MPV: 9.6 fL (ref 9.4–12.3)
Monocytes Absolute Automated: 0.39 10*3/uL (ref 0.00–1.20)
Monocytes: 7 %
Neutrophils Absolute: 4.55 10*3/uL (ref 1.80–8.10)
Neutrophils: 76 %
Nucleated RBC: 0 /100 WBC (ref 0–1)
Platelets: 116 10*3/uL — ABNORMAL LOW (ref 140–400)
RBC: 2.59 10*6/uL — ABNORMAL LOW (ref 4.70–6.00)
RDW: 18 % — ABNORMAL HIGH (ref 12–15)
WBC: 5.95 10*3/uL (ref 3.50–10.80)

## 2015-08-25 LAB — HEMOLYSIS INDEX: Hemolysis Index: 2 (ref 0–18)

## 2015-08-25 LAB — LIPASE: Lipase: 75 U/L (ref 8–78)

## 2015-08-25 LAB — GFR: EGFR: 5.1

## 2015-08-25 LAB — LACTIC ACID, PLASMA: Lactic Acid: 0.7 mmol/L (ref 0.2–2.0)

## 2015-08-25 MED ORDER — CALCIUM CHLORIDE 10 % IV SOLN
1.0000 g | Freq: Once | INTRAVENOUS | Status: DC
Start: 2015-08-25 — End: 2015-08-25
  Filled 2015-08-25: qty 10

## 2015-08-25 MED ORDER — HYDRALAZINE HCL 20 MG/ML IJ SOLN
10.0000 mg | Freq: Once | INTRAMUSCULAR | Status: AC
Start: 2015-08-25 — End: 2015-08-25
  Administered 2015-08-25: 10 mg via INTRAVENOUS
  Filled 2015-08-25: qty 1

## 2015-08-25 MED ORDER — INSULIN REGULAR HUMAN 100 UNIT/ML IJ SOLN
5.0000 [IU] | Freq: Once | INTRAMUSCULAR | Status: DC
Start: 2015-08-25 — End: 2015-08-25
  Filled 2015-08-25: qty 15

## 2015-08-25 MED ORDER — SODIUM CHLORIDE 0.9 % IV BOLUS
30.0000 mL/kg | Freq: Once | INTRAVENOUS | Status: AC
Start: 2015-08-25 — End: 2015-08-25
  Administered 2015-08-25: 3333 mL via INTRAVENOUS

## 2015-08-25 MED ORDER — DEXTROSE 10 % IV BOLUS
125.0000 mL | Freq: Once | INTRAVENOUS | Status: DC
Start: 2015-08-25 — End: 2015-08-25
  Filled 2015-08-25: qty 250

## 2015-08-25 MED ORDER — FENTANYL CITRATE (PF) 50 MCG/ML IJ SOLN (WRAP)
25.0000 ug | Freq: Once | INTRAMUSCULAR | Status: AC
Start: 2015-08-25 — End: 2015-08-25
  Administered 2015-08-25: 25 ug via INTRAVENOUS
  Filled 2015-08-25: qty 2

## 2015-08-25 MED ORDER — ALBUTEROL SULFATE (2.5 MG/3ML) 0.083% IN NEBU
7.5000 mg | INHALATION_SOLUTION | Freq: Once | RESPIRATORY_TRACT | Status: AC
Start: 2015-08-25 — End: 2015-08-25
  Administered 2015-08-25: 7.5 mg via RESPIRATORY_TRACT
  Filled 2015-08-25: qty 9

## 2015-08-25 NOTE — ED Notes (Signed)
[name] is a 45 y.o. male presenting to the ED with RLQ abd pain x4 days. Nausea. Vomiting. Diff urinating. Last dialysis was Monday. abd distended.

## 2015-08-25 NOTE — ED Provider Notes (Addendum)
EMERGENCY DEPARTMENT HISTORY AND PHYSICAL EXAM     Physician/Midlevel provider first contact with patient: 08/25/15 1552         Date: 08/25/2015  Patient Name: [name]    History of Presenting Illness     Chief Complaint   Patient presents with   . Abdominal Pain   . Diarrhea       History Provided By: Patient    Chief Complaint: N/V/D  Onset: A couple of days ago  Timing: Intermittent  Location: GI  Severity: Moderate  Exacerbating factors: None  Alleviating factors: None  Associated Symptoms: SOB, abd pain    Additional History: [name] is a 45 y.o. male presenting to the ED with N/V/D as well as abd pain and SOB for a couple of days. Pt had last round of dialysis 1 week ago and has not been able to go since then due to his symptoms. Pt is not currently taking any abx. He was dxd with C.diff last month. Denies SI, HI, or substance abuse.     PCP: Pcp, Noneorunknown, MD  SPECIALISTS:  [name]    Current Facility-Administered Medications   Medication Dose Route Frequency Provider Last Rate Last Dose   . calcium chloride 10 % injection 1 g  1 g Intravenous Once Maryella Shivers, MD       . dextrose (D10W) 10% bolus 125 mL  125 mL Intravenous Once Maryella Shivers, MD       . insulin regular (HumuLIN R,NovoLIN R) injection 5 Units  5 Units Intravenous Once Maryella Shivers, MD   5 Units at 08/25/15 1725     Current Outpatient Prescriptions   Medication Sig Dispense Refill   . albuterol (PROVENTIL) (2.5 MG/3ML) 0.083% nebulizer solution Take 2.5 mg by nebulization.     Marland Kitchen albuterol-ipratropium (COMBIVENT RESPIMAT) 20-100 MCG/ACT Aero Soln Inhale 2 puffs into the lungs.     Marland Kitchen amLODIPine (NORVASC) 10 MG tablet Take 10 mg by mouth daily. Unable to recall dose     . carvedilol (COREG) 25 MG tablet Take 25 mg by mouth 2 (two) times daily.     . furosemide (LASIX) 80 MG tablet Take 80 mg by mouth daily.     . hydrALAZINE (APRESOLINE) 100 MG tablet Take 100 mg by mouth 3 (three) times daily.     . sevelamer  (RENVELA) 800 MG tablet Take 800 mg by mouth 3 (three) times daily with meals.         Past History     Past Medical History:  Past Medical History   Diagnosis Date   . Hypertension    . Kidney failure    . Asthma without status asthmaticus    . Clostridium difficile diarrhea        Past Surgical History:  Past Surgical History   Procedure Laterality Date   . Av fistula placement         Family History:  History reviewed. No pertinent family history.    Social History:  Social History   Substance Use Topics   . Smoking status: Light Tobacco Smoker   . Smokeless tobacco: None   . Alcohol Use: No       Allergies:  Allergies   Allergen Reactions   . Lisinopril Anaphylaxis   . Morphine Anaphylaxis   . Toradol [Ketorolac Tromethamine]        Review of Systems     Review of Systems   Constitutional: Negative for  fever and fatigue.   HENT: Negative for rhinorrhea and sore throat.    Eyes: Negative for discharge, redness and visual disturbance.   Respiratory: Positive for shortness of breath. Negative for cough.    Cardiovascular: Negative for chest pain and leg swelling.   Gastrointestinal: Positive for nausea, vomiting, abdominal pain and diarrhea.   Endocrine: Negative for polyuria.   Genitourinary: Negative for dysuria, urgency, frequency and flank pain.   Musculoskeletal: Negative for back pain, neck pain and neck stiffness.   Skin: Negative for rash.   Allergic/Immunologic: Negative for immunocompromised state.   Neurological: Negative for light-headedness and headaches.   Hematological: Does not bruise/bleed easily.   Psychiatric/Behavioral: Negative for suicidal ideas.         Physical Exam   BP 183/105 mmHg  Pulse 101  Temp(Src) 97.9 F (36.6 C) (Oral)  Resp 18  Ht 6\' 1"  (1.854 m)  Wt 111.131 kg  BMI 32.33 kg/m2  SpO2 100%    Constitutional: Vital signs reviewed. Well appearing. No distress.  Head: Normocephalic, atraumatic  Eyes: Conjunctiva and sclera are normal.  No injection or discharge.  Ears, Nose,  Throat:  Normal external examination of the nose and ears.  Mucous membranes moist.  Neck: Normal range of motion. Supple, no meningeal signs. Trachea midline. No JVD  Respiratory/Chest: Clear to auscultation. No respiratory distress.   Cardiovascular: Regular rate and rhythm. No murmurs.  Abdomen:  Bowel sounds intact. No rebound or guarding. Soft. (+) diffuse lower abdominal tenderness to palpation.   Back: No cva tenderness to percussion.  Upper Extremity:  No edema. No cyanosis. Bilateral radial pulses intact and equal.   Lower Extremity:  No edema. No cyanosis. Bilateral calves symmetrical and non-tender.   Skin: Warm and dry. No rash.  Neuro: A&Ox3. CN II-XII intact to testing. Moves all extremities spontaneously. Normal gait.   Psychiatric: Normal affect.  Normal insight.      Diagnostic Study Results     Labs -     Results     Procedure Component Value Units Date/Time    Comprehensive Metabolic Panel (CMP) [[id number]]  (Abnormal) Collected:  08/25/15 1619    Specimen Information:  Blood Updated:  08/25/15 1658     Glucose 92 mg/dL      BUN 40.1 (H) mg/dL      Creatinine 02.7 (H) mg/dL      Sodium 253 mEq/L      Potassium 5.5 (H) mEq/L      Chloride 105 mEq/L      CO2 20 (L) mEq/L      Calcium 6.7 (L) mg/dL      Protein, Total 5.6 (L) g/dL      Albumin 2.6 (L) g/dL      AST (SGOT) 17 U/L      ALT 13 U/L      Alkaline Phosphatase 218 (H) U/L      Bilirubin, Total 0.6 mg/dL      Globulin 3.0 g/dL      Albumin/Globulin Ratio 0.9      Anion Gap 14.0     Lipase [[id number]] Collected:  08/25/15 1619    Specimen Information:  Blood Updated:  08/25/15 1658     Lipase 75 U/L     Hemolysis index [[id number]] Collected:  08/25/15 1619     Hemolysis Index 2 Updated:  08/25/15 1658    GFR [[id number]] Collected:  08/25/15 1619     EGFR 5.1 Updated:  08/25/15 1658    CBC with differential [  [id number]]  (Abnormal) Collected:  08/25/15 1619    Specimen Information:  Blood from Blood Updated:  08/25/15 1643     WBC 5.95 x10 3/uL       Hgb 7.7 (L) g/dL      Hematocrit 02.7 (L) %      Platelets 116 (L) x10 3/uL      RBC 2.59 (L) x10 6/uL      MCV 95.4 fL      MCH 29.7 pg      MCHC 31.2 (L) g/dL      RDW 18 (H) %      MPV 9.6 fL      Neutrophils 76 %      Lymphocytes Automated 10 %      Monocytes 7 %      Eosinophils Automated 7 %      Basophils Automated 0 %      Immature Granulocyte 0 %      Nucleated RBC 0 /100 WBC      Neutrophils Absolute 4.55 x10 3/uL      Abs Lymph Automated 0.57 x10 3/uL      Abs Mono Automated 0.39 x10 3/uL      Abs Eos Automated 0.42 x10 3/uL      Absolute Baso Automated 0.02 x10 3/uL      Absolute Immature Granulocyte 0.01 x10 3/uL     Lactic acid x2, sepsis [[id number]] Collected:  08/25/15 1619    Specimen Information:  Blood Updated:  08/25/15 1623     Lactic acid 0.7 mmol/L     Narrative:      Cancel if the initial lactate level is < 2.0 mmol/L.    Blood Culture Aerobic/Anaerobic #2 [[id number]] Collected:  08/25/15 1620    Specimen Information:  Arm from Blood Updated:  08/25/15 1620    Narrative:      1 BLUE+1 PURPLE    Lactic acid x2, sepsis [[id number]] Collected:  08/25/15 1620    Specimen Information:  Blood Updated:  08/25/15 1620    Narrative:      Cancel if the initial lactate level is < 2.0 mmol/L.  ABG OK;no tnqt;ice;call RT for results.    Blood Culture Aerobic/Anaerobic #1 [[id number]] Collected:  08/25/15 1619    Specimen Information:  Arm from Blood Updated:  08/25/15 1620    Narrative:      1 BLUE+1 PURPLE          Radiologic Studies -   Radiology Results (24 Hour)     ** No results found for the last 24 hours. **      .    Medical Decision Making   I am the first provider for this patient.    I reviewed the vital signs, available nursing notes, past medical history, past surgical history, family history and social history.    Vital Signs-Reviewed the patient's vital signs.     Patient Vitals for the past 12 hrs:   BP Temp Pulse Resp   08/25/15 1656 (!) 183/105 mmHg 97.9 F (36.6 C) (!) 101 18    08/25/15 1530 (!) 168/94 mmHg 98.3 F (36.8 C) (!) 102 16       Pulse Oximetry Analysis - Normal 100% on RA    Cardiac Monitor:  Rate: 101  Rhythm:  Normal Sinus Rhythm     EKG:  Interpreted by the EP.   Time Interpreted: 1633   Rate: 99 bpm   Rhythm: Normal Sinus Rhythm    Interpretation:  No ST elevation QTC 482 QT prolongation   Comparison: Compared to EKG from 11/17/2013, there is increased rate and stable QT prolongation    Medical records:  Previous records  Nursing notes  Previous EKG  Previous imaging  Previous labs        ED Course:     5:17 PM - Discussed plan. Pt ambulated to bathroom to provide stool sample .     5:46 PM - Pt wants to leave. Reviewed all results and danger of death if he leaves. Patient A&OX3, able to make informed decision for himself. Able to repeat risks of discharge in layman's terms to me. Does not want to get complete treatment for hyperkalemia or get CT scan of abdomen. Extensively discussed again results from blood work and risks of discharge. Reviewed he could go into cardiac arrest and die at any moment. Pt acknowledges this fact and would still like to leave AMA. Reviewed with pt that he is welcome to return to ED at any time if he changes mind about admission and he voiced understanding.     Provider Notes: Pt presenting with history of ESRD (missed dialysis for past week) presenting to ED with diarrhea, nausea, vomiting, and abdominal pain. Also hyperkalemic without EKG changes. Pt declined all treatments and left AMA as above.     Critical care: CRITICAL CARE: The high probability of sudden, clinically significant deterioration in the patient's condition required the highest level of my preparedness to intervene urgently.    The services I provided to this patient were to treat and/or prevent clinically significant deterioration that could result in: cardiac arrest, death.  Services included the following: chart data review, reviewing nursing notes and/or old charts,  documentation time, consultant collaboration regarding findings and treatment options, medication orders and management, direct patient care, re-evaluations, vital sign assessments and ordering, interpreting and reviewing diagnostic studies/lab tests.    Aggregate critical care time was 35 minutes, which includes only time during which I was engaged in work directly related to the patient's care, as described above, whether at the bedside or elsewhere in the Emergency Department.  It did not include time spent performing other reported procedures or the services of residents, students, nurses or physician assistants.          Diagnosis     Clinical Impression:   1. Diarrhea of presumed infectious origin    2. Serum potassium elevated        Treatment Plan:   ED Disposition     AMA Date: 08/25/2015  Patient: [name]  Admitted: 08/25/2015  3:42 PM  Attending Provider: Maryella Shivers, MD    [name] or his authorized caregiver has made the decision for the patient to leave the emergency department against the advice of Dr. Lucianne Muss. He or his authorized caregiver has been informed and understands the inherent risks, including death.  He or his authorized caregiver has decided to accept the responsibility for this decision. [name] and all necessary parties have been advised that he may return for further evaluation or treatment. His condition at time of discharge was guarded.  [name] had current vital signs as follows:  BP 183/105 Pulse 101 Temp 97.9 F (36.6 C) Temp Src: Oral Resp 18 Ht 6\' 1"  (1.854 m) Wt 111.131 kg                _______________________________      Attestations: This note is prepared by Rosalio Macadamia,  acting as scribe for Lynnea Ferrier, MD. The scribe's documentation has been prepared under my direction and personally reviewed by me in its entirety.  I confirm that the note above accurately reflects all work, treatment, procedures, and medical decision making performed  by me.    _______________________________    Maryella Shivers, MD  08/26/15 0155    Maryella Shivers, MD  08/26/15 [phone]

## 2015-08-25 NOTE — ED Notes (Signed)
Pt refused rest of the medications. MD made ware. Pt wants to leave AMA. AMA from signed and witnessed by MD Lucianne Muss.

## 2015-08-25 NOTE — Discharge Instructions (Signed)
Follow up in the emergency room or with your kidney doctor for dialysis as soon as possible.     Your potassium today is high. You did not finish treatment for high potassium. High potassium can be fatal. Missing dialysis can be fatal. Risks of leaving today include cardiac arrest or death.     Please return to this ED or the closest ED immediately if you change your mind about receiving appropriate treatment.

## 2015-08-26 LAB — ECG 12-LEAD
Atrial Rate: 99 {beats}/min
P Axis: 85 degrees
P-R Interval: 178 ms
Q-T Interval: 376 ms
QRS Duration: 78 ms
QTC Calculation (Bezet): 482 ms
R Axis: -49 degrees
T Axis: 97 degrees
Ventricular Rate: 99 {beats}/min

## 2015-08-30 ENCOUNTER — Emergency Department
Admission: EM | Admit: 2015-08-30 | Discharge: 2015-08-30 | Discharge status: Against Medical Advice | Payer: Medicare Other | Attending: Emergency Medicine | Admitting: Emergency Medicine

## 2015-08-30 ENCOUNTER — Emergency Department: Payer: Medicare Other

## 2015-08-30 DIAGNOSIS — Z8619 Personal history of other infectious and parasitic diseases: Secondary | ICD-10-CM | POA: Insufficient documentation

## 2015-08-30 DIAGNOSIS — J45909 Unspecified asthma, uncomplicated: Secondary | ICD-10-CM | POA: Insufficient documentation

## 2015-08-30 DIAGNOSIS — I120 Hypertensive chronic kidney disease with stage 5 chronic kidney disease or end stage renal disease: Secondary | ICD-10-CM | POA: Insufficient documentation

## 2015-08-30 DIAGNOSIS — Z992 Dependence on renal dialysis: Secondary | ICD-10-CM | POA: Insufficient documentation

## 2015-08-30 DIAGNOSIS — N186 End stage renal disease: Secondary | ICD-10-CM | POA: Insufficient documentation

## 2015-08-30 DIAGNOSIS — Z79899 Other long term (current) drug therapy: Secondary | ICD-10-CM | POA: Insufficient documentation

## 2015-08-30 DIAGNOSIS — Z765 Malingerer [conscious simulation]: Secondary | ICD-10-CM | POA: Insufficient documentation

## 2015-08-30 LAB — CBC AND DIFFERENTIAL
Basophils Absolute Automated: 0.02 10*3/uL (ref 0.00–0.20)
Basophils Automated: 0 %
Eosinophils Absolute Automated: 0.37 10*3/uL (ref 0.00–0.70)
Eosinophils Automated: 5 %
Hematocrit: 25.8 % — ABNORMAL LOW (ref 42.0–52.0)
Hgb: 8 g/dL — ABNORMAL LOW (ref 13.0–17.0)
Immature Granulocytes Absolute: 0.03 10*3/uL
Immature Granulocytes: 0 %
Lymphocytes Absolute Automated: 0.69 10*3/uL (ref 0.50–4.40)
Lymphocytes Automated: 9 %
MCH: 29.5 pg (ref 28.0–32.0)
MCHC: 31 g/dL — ABNORMAL LOW (ref 32.0–36.0)
MCV: 95.2 fL (ref 80.0–100.0)
MPV: 9.6 fL (ref 9.4–12.3)
Monocytes Absolute Automated: 0.33 10*3/uL (ref 0.00–1.20)
Monocytes: 4 %
Neutrophils Absolute: 5.95 10*3/uL (ref 1.80–8.10)
Neutrophils: 80 %
Nucleated RBC: 0 /100 WBC (ref 0–1)
Platelets: 130 10*3/uL — ABNORMAL LOW (ref 140–400)
RBC: 2.71 10*6/uL — ABNORMAL LOW (ref 4.70–6.00)
RDW: 17 % — ABNORMAL HIGH (ref 12–15)
WBC: 7.39 10*3/uL (ref 3.50–10.80)

## 2015-08-30 LAB — BASIC METABOLIC PANEL
BUN: 87 mg/dL — ABNORMAL HIGH (ref 9.0–28.0)
CO2: 15 mEq/L — ABNORMAL LOW (ref 22–29)
Calcium: 6.9 mg/dL — ABNORMAL LOW (ref 8.5–10.5)
Chloride: 108 mEq/L (ref 100–111)
Creatinine: 12.5 mg/dL — ABNORMAL HIGH (ref 0.7–1.3)
Glucose: 87 mg/dL (ref 70–100)
Potassium: 6.4 mEq/L (ref 3.5–5.1)
Sodium: 139 mEq/L (ref 136–145)

## 2015-08-30 LAB — GFR: EGFR: 5.3

## 2015-08-30 NOTE — Discharge Instructions (Signed)
Dear Mr. Colpitts:    Thank you for choosing the [facility] Emergency Department, the premier emergency department in the [place] area.  I hope your visit today was EXCELLENT.    Specific instructions for your visit today:      Missed Dialysis Appointment    You have been seen because you missed your dialysis appointment.    When the kidneys don't work well, you need dialysis to clean the blood and remove harmful waste that is made by your body. If your kidneys don't work, you can't live without having dialysis regularly.    It is very important to go to your regularly scheduled dialysis appointments. This will help to prevent serious side-effects from kidney failure.    Problems that can happen when you miss your regular dialysis include the following:   Electrolytes (especially potassium) in your blood can be too high or too low. This can be very dangerous, especially for your heart.   Nitrogen can build up in your blood stream and cause problems with your nervous system.   You can have too much fluid in your body. This can cause problems with breathing. It also puts stress on your heart.    YOU SHOULD SEEK MEDICAL ATTENTION IMMEDIATELY, EITHER HERE OR AT THE NEAREST EMERGENCY DEPARTMENT, IF ANY OF THE FOLLOWING OCCURS:   Your symptoms get worse or you have new problems or concerns.   You have trouble breathing, chest pain, or worse swelling in your legs.   You miss a dialysis appointment.   Your personality or behavior changes.                If you do not continue to improve or your condition worsens, please contact your doctor or return immediately to the Emergency Department.    Sincerely,  Arlana Lindau, MD  Attending Emergency Physician  [facility] Emergency Department    ONSITE PHARMACY  Our full service onsite pharmacy is located in the ER waiting room.  Open 7 days a week from 9 am to 11 pm.  We accept all major insurances and prices are competitive with major retailers.   Ask your provider to print your prescriptions down to the pharmacy to speed you on your way home.    OBTAINING A PRIMARY CARE APPOINTMENT    Primary care physicians (PCPs, also known as primary care doctors) are either internists or family medicine doctors. Both types of PCPs focus on health promotion, disease prevention, patient education and counseling, and treatment of acute and chronic medical conditions.    Call for an appointment with a primary care doctor.  Ask to see who is taking new patients.     [facility]  telephone:  [phone]  [email or web address]    DOCTOR REFERRALS  Call [phone] (available 24 hours a day, 7 days a week) if you need any further referrals and we can help you find a primary care doctor or specialist.  Also, available online at:  [email or web address]    YOUR CONTACT INFORMATION  Before leaving please check with registration to make sure we have an up-to-date contact number.  You can call registration at [phone] to update your information.  For questions about your hospital bill, please call [phone].  For questions about your Emergency Dept Physician bill please call [phone].      FREE HEALTH SERVICES  If you need help with health or social services, please call 2-1-1 for a free referral to resources in your area.  2-1-1 is a free service connecting people with information on health insurance, free clinics, pregnancy, mental health, dental care, food assistance, housing, and substance abuse counseling.  Also, available online at:  http://www.[facility].org    MEDICAL RECORDS AND TESTS  Certain laboratory test results do not come back the same day, for example urine cultures.   We will contact you if other important findings are noted.  Radiology films are often reviewed again to ensure accuracy.  If there is any discrepancy, we will notify you.      Please call [phone] to pick up a complimentary CD of any radiology studies  performed.  If you or your doctor would like to request a copy of your medical records, please call [phone].      ORTHOPEDIC INJURY   Please know that significant injuries can exist even when an initial x-ray is read as normal or negative.  This can occur because some fractures (broken bones) are not initially visible on x-rays.  For this reason, close outpatient follow-up with your primary care doctor or bone specialist (orthopedist) is required.    MEDICATIONS AND FOLLOWUP  Please be aware that some prescription medications can cause drowsiness.  Use caution when driving or operating machinery.    The examination and treatment you have received in our Emergency Department is provided on an emergency basis, and is not intended to be a substitute for your primary care physician.  It is important that your doctor checks you again and that you report any new or remaining problems at that time.      24 HOUR PHARMACIES  The nearest 24 hour pharmacy is:    [facility]  [address]  [place], [place] [address]  [phone]      ASSISTANCE WITH INSURANCE    Affordable Care Act  [facility])  Call to start or finish an application, compare plans, enroll or ask a question.  [phone]  TTY: [phone]  Web:  Healthcare.gov    Help Enrolling in [facility]  [employer] [place]  (250)570-4331 (TOLL-FREE)  409-059-2072 (TTY)  Web:  Http://www.coverva.org    Local Help Enrolling in the [facility]  [place] [facility]  534-670-2162 (MAIN)  Email:  health-help@nvfs .org  Web:  [employer]jackMyths.is  Address:  [address], [address] [place], [place] [address]    SEDATING MEDICATIONS  Sedating medications include strong pain medications (e.g. narcotics), muscle relaxers, benzodiazepines (used for anxiety and as muscle relaxers), Benadryl/diphenhydramine and other antihistamines for allergic reactions/itching, and other medications.  If you are unsure if you have received a sedating medication, please  ask your physician or nurse.  If you received a sedating medication: DO NOT drive a car. DO NOT operate machinery. DO NOT perform jobs where you need to be alert.  DO NOT drink alcoholic beverages while taking this medicine.     If you get dizzy, sit or lie down at the first signs. Be careful going up and down stairs.  Be extra careful to prevent falls.     Never give this medicine to others.     Keep this medicine out of reach of children.     Do not take or save old medicines. Throw them away when outdated.     Keep all medicines in a cool, dry place. DO NOT keep them in your bathroom medicine cabinet or in a cabinet above the stove.    MEDICATION REFILLS  Please be aware that we cannot refill any prescriptions  through the ER. If you need further treatment from what is provided at your ER visit, please follow up with your primary care doctor or your pain management specialist.    FREESTANDING EMERGENCY DEPARTMENTS OF [facility]  Did you know [facility] has two freestanding ERs located just a few miles away?  [facility] [place] and [facility] [place] have short wait times, easy free parking directly in front of the building and top patient satisfaction scores - and the same Board Certified Emergency Medicine doctors as [facility].              AALIJAH LANPHERE  [id number]  [id number]  [phone]  08/30/2015    Discharge Instructions    As always, you are the most important factor in your recovery.  Please follow these instructions carefully.  If you have problems that we have not discussed, CALL OR VISIT YOUR DOCTOR RIGHT AWAY.     If you can't reach your doctor, return to the emergency department.    I [name] understand the written and discussed instructions.  My questions have been answered.  I acknowledge receipt of these instructions.     Patient or responsible person:         Patient's Signature               Physician or Nurse

## 2015-08-30 NOTE — ED Notes (Signed)
Dialysis M/W/F. Last had Dialysis on Monday (missed 2 appointments this week).  Fistula in left arm.  Pt states that he had C. Diff last month (at [facility]).

## 2015-08-30 NOTE — ED Provider Notes (Signed)
[name] EMERGENCY DEPARTMENT H&P      Visit date: 08/30/2015      CLINICAL SUMMARY          Diagnosis:    .     Final diagnoses:   ESRD (end stage renal disease) on dialysis         MDM Notes:      I am the first provider for this patient.  I reviewed the vital signs, nursing notes, past medical history, past surgical history, family history and social history.  I have reviewed the patient's previous charts.    : The patient has decided not to proceed with further recommended testing or treatment to determine the cause of their symptoms. The risks, including, but not limited to, death or permanent neurologic or cardiac sequalae were explained to the patient. Alternatives to the recommendation were discussed and the patient voiced understanding. The patient appears clinically to have capacity to make this decision. He appears only concerned about receiving IV opiates.  The patient was instructed that he/she could return to the ER at any time to complete the testing or treatment..                Disposition:         AMA                CLINICAL INFORMATION        HPI:      Chief Complaint: Abdominal Pain  .    [name] is a 45 y.o. male with pertinent pmhx of HTN, kidney failure (on dialysis Mon/ Wed/ Fri) who presents with abdominal pain for the past 6 days.  Associated sx include diarrhea, vomiting, and nausea.  He's had similar sx in the past and noted that would likely need fecal transplant.  Last dialysis tx was on Monday (6 days pta).  Last week went to [facility] ER with similar sx.       History obtained from: patient, review of prior chart          ROS:      Positive and Negative ROS elements as per HPI  All Other Systems Reviewed and Negative: Yes      Physical Exam:      Pulse (!) 105  BP (!) 200/105 mmHg  Resp 15  SpO2 98 %  Temp 97.3 F (36.3 C)    Physical Exam   Nursing note and vitals reviewed.  Constitutional:Pt is oriented to person, place, and time. Appears older than stated age.  Ill  appearing  HEENT:   Head: Normocephalic and atraumatic.   Right Ear: External ear normal.   Left Ear: External ear normal.   Nose: Nose normal.   Mouth/Throat: Oropharynx is clear and moist. No oropharyngeal exudate.  Mostly edentulous   Eyes: Conjunctivae and EOM are normal. Pupils are equal, round, and reactive to light. Right eye exhibits no discharge. Left eye exhibits no discharge. No scleral icterus.   Neck: Normal range of motion. Neck supple. No JVD present. No tracheal deviation present. No thyromegaly present.   Cardiovascular: Normal rate, regular rhythm, normal heart sounds and intact distal pulses.  Exam reveals no gallop and no friction rub.    No murmur heard.  Patent LUE AV fistula.  Pulmonary/Chest: Effort normal. No stridor. No respiratory distress. Pt has no wheezes. Pt has no rales. Pt exhibits no tenderness.  Rhonchi at bases.   Abdominal: Distended abd.  Minimally diffusely tender abd. . Bowel sounds  are normal. Pt exhibits no mass.  There is no rebound and no guarding.   Musculoskeletal:  3+ pitting edema. Marland Kitchen FROM  Neurological:  No cranial nerve deficit. Pt exhibits normal muscle tone. Coordination normal.   Skin: Skin is warm and dry. Pt is not diaphoretic.   Psychiatric: Pt has a normal mood and affect. Behavior is normal. Judgment and thought content normal            PAST HISTORY        Primary Care Provider: Christa See, MD        PMH/PSH:    .     Past Medical History   Diagnosis Date   . Hypertension    . Kidney failure    . Asthma without status asthmaticus    . Clostridium difficile diarrhea        He has past surgical history that includes AV fistula placement.      Social/Family History:      He reports that he has been smoking.  He does not have any smokeless tobacco history on file. He reports that he does not drink alcohol or use illicit drugs.    History reviewed. No pertinent family history.      Listed Medications on Arrival:    .     Home Medications     Last  Medication Reconciliation Action:  Complete Jorge Acosta, RN 08/30/2015  1:30 AM                  albuterol (PROVENTIL) (2.5 MG/3ML) 0.083% nebulizer solution     Take 2.5 mg by nebulization.     albuterol-ipratropium (COMBIVENT RESPIMAT) 20-100 MCG/ACT Aero Soln     Inhale 2 puffs into the lungs.     amLODIPine (NORVASC) 10 MG tablet     Take 10 mg by mouth daily. Unable to recall dose     carvedilol (COREG) 25 MG tablet     Take 25 mg by mouth 2 (two) times daily.     furosemide (LASIX) 80 MG tablet     Take 80 mg by mouth daily.     hydrALAZINE (APRESOLINE) 100 MG tablet     Take 100 mg by mouth 3 (three) times daily.     sevelamer (RENVELA) 800 MG tablet     Take 800 mg by mouth 3 (three) times daily with meals.         Allergies: He is allergic to lisinopril; morphine; and toradol.            VISIT INFORMATION        Clinical Course in the ED:      1:45 AM- Reviewed care everywhere; pt has long hx of non-compliance with mult hospital visits and documented drug seeking behavior.  Will evaluate metabolic condition.  Pt was instructed there's no indication for IV opiates at this time.        Medications Given in the ED:    .     ED Medication Orders     None            Procedures:      Procedures      Interpretations:      Pulse ox reviewed by me. All ordered labs and images reviewed by me.                    RESULTS        Lab Results:  Results     Procedure Component Value Units Date/Time    CBC with Differential [[id number]]  (Abnormal) Collected:  08/30/15 0143    Specimen Information:  Blood from Blood Updated:  08/30/15 0157     WBC 7.39 x10 3/uL      Hgb 8.0 (L) g/dL      Hematocrit 40.9 (L) %      Platelets 130 (L) x10 3/uL      RBC 2.71 (L) x10 6/uL      MCV 95.2 fL      MCH 29.5 pg      MCHC 31.0 (L) g/dL      RDW 17 (H) %      MPV 9.6 fL      Neutrophils 80 %      Lymphocytes Automated 9 %      Monocytes 4 %      Eosinophils Automated 5 %      Basophils Automated 0 %      Immature Granulocyte 0  %      Nucleated RBC 0 /100 WBC      Neutrophils Absolute 5.95 x10 3/uL      Abs Lymph Automated 0.69 x10 3/uL      Abs Mono Automated 0.33 x10 3/uL      Abs Eos Automated 0.37 x10 3/uL      Absolute Baso Automated 0.02 x10 3/uL      Absolute Immature Granulocyte 0.03 x10 3/uL     Basic Metabolic Panel (BMP) [[id number]] Collected:  08/30/15 0143    Specimen Information:  Blood Updated:  08/30/15 0150              Radiology Results:      No orders to display               Scribe Attestation:      I was acting as a Neurosurgeon for Arlana Lindau, MD on [facility]  Treatment Team: Scribe: Dorathy Daft   I am the first provider for this patient and I personally performed the services documented. Treatment Team: Scribe: Dorathy Daft is scribing for me on [facility]. This note accurately reflects work and decisions made by me.  Arlana Lindau, MD           Arlana Lindau, MD  08/30/15 [phone]

## 2015-10-13 ENCOUNTER — Emergency Department: Admit: 2015-10-14 | Payer: MEDICARE

## 2015-10-13 ENCOUNTER — Inpatient Hospital Stay

## 2015-10-13 DIAGNOSIS — K529 Noninfective gastroenteritis and colitis, unspecified: Secondary | ICD-10-CM

## 2015-10-13 NOTE — ED Triage Notes (Signed)
TRIAGE NOTE: Pt c/o of abdominal pain, diarrhea, and abdominal distension since Monday, patient recently under treatment for C. Diff, pt thinks it's not resolved.      Pt states he has to get paracentesis on his abd for fluid, pt states nobody knows why.

## 2015-10-13 NOTE — ED Provider Notes (Signed)
HPI Comments: 45 y.o. male with past medical history significant for CKD, C-diff, HTN, and asthma who presents from his sisters house with chief complaint of diarrhea. Pt states that he has had on and off C-diff for the past 6 months. He was told he may need a fecal transplant.  Last given vancomyin in 08/2015 for c.diff.  Pt states that he was admitted to [facility] 2 days ago for positive C-diff and left AMA yesterday because he did not think he was getting proper treatment. Pt states that today he has had 8 episodes of diarrhea and 8 episodes of vomiting. He has also had a fever(101), SOB, leg swelling, and abdominal distension(chronic). Pt is a MWF dialysis Pt and missed his dialysis today. Pt has been taking tylenol for the fever(last dose 2 hours ago). Pt has had many paracentesis' for the ascites.  2 prior bouts of SBP in last 6 or so months.  . Pt's last course of vancomycin was 1 month ago. Pt denies any hemoptysis or blood in his stool. Pt takes 22m lasix every day. There are no other acute medical concerns at this time.  Social hx: Tobacco use. Denies alcohol use or drug use.  Visiting from [place]    PCP: None    Note written by JMonia Sabal Scribe, as dictated by RAshley Akin MD 10:37 PM      The history is provided by the patient. No language interpreter was used.        Past Medical History:   Diagnosis Date   ??? Ascites    ??? Asthma    ??? C. difficile colitis    ??? CKD (chronic kidney disease) stage V requiring chronic dialysis ([facility]    ??? HTN (hypertension)    ??? SBP (spontaneous bacterial peritonitis) ([facility]        History reviewed. No pertinent surgical history.      History reviewed. No pertinent family history.    Social History     Social History   ??? Marital status: DIVORCED     Spouse name: N/A   ??? Number of children: N/A   ??? Years of education: N/A     Occupational History   ??? Not on file.     Social History Main Topics   ??? Smoking status: Heavy Tobacco Smoker      Packs/day: 0.50   ??? Smokeless tobacco: Never Used   ??? Alcohol use No   ??? Drug use: No   ??? Sexual activity: Not on file     Other Topics Concern   ??? Not on file     Social History Narrative   ??? No narrative on file         ALLERGIES: Lisinopril; Morphine; and Toradol [ketorolac]    Review of Systems   Constitutional: Positive for fever.   Respiratory: Positive for shortness of breath.    Cardiovascular: Positive for leg swelling.   Gastrointestinal: Positive for abdominal distention, diarrhea and vomiting. Negative for blood in stool.   All other systems reviewed and are negative.      Vitals:    10/13/15 2131 10/14/15 0030   BP: (!) 180/97 155/84   Pulse: (!) 104 93   Resp: 18 16   Temp: 98.8 ??F (37.1 ??C)    SpO2: 99% 98%   Weight: 115.2 kg (254 lb)    Height: _0  (1.854 m)             Physical  Exam     Nursing note and vitals reviewed.  Constitutional: appears well-developed and well-nourished. No distress.   HENT:   Head: Normocephalic and atraumatic. Sclera anicteric  Nose: No rhinorrhea  Mouth/Throat: Oropharynx is clear and DRY. Pharynx normal  Eyes: Conjunctivae are normal. Pupils are equal, round, and reactive to light. Right eye exhibits no discharge. Left eye exhibits no discharge. No scleral icterus.   Neck: Painless normal range of motion. Supple  Cardiovascular: Normal rate, regular rhythm, normal heart sounds and intact distal pulses.  Exam reveals no gallop and no friction rub.  No murmur heard.  Pulmonary/Chest: Effort normal and breath sounds normal. No respiratory distress. no wheezes. no rales.   Abdominal: Soft. Bowel sounds are normal. Exhibits DISTENTION AND DIFFUSE TENDERNESS.    Musculoskeletal: Normal range of motion. no tenderness. 1+ EDEMA  Lymphadenopathy:   No cervical adenopathy.   Neurological:  Alert and oriented to person, place, and time. Coordination normal. CN 2-12 intact.  Moving all extremities.    Skin: Skin is warm and dry. No rash noted. No pallor.         MDM  ED Course    45 year old male with complex past medical history including chronic kidney disease on hemodialysis, C. difficile colitis recurrent, hypertension, asthma here with abdominal pain and distention, fever, C. difficile confirmed at outside hospital 2 days ago and some shortness of breath.  Broad differential diagnosis including C. difficile, pneumonia, perforation of bowel, spontaneous bacterial peritonitis, bacteremia and many others. We'll check chest x-ray, CT abdomen and pelvis without contrast, abdominal ultrasound assessing for ascitic fluid, diagnostic paracentesis, cultures, gentle IV fluid hydration given the dehydration associated with the vomiting diarrhea. Antiemetic and pain medication.    12:25 AM  Cloudy fluid with tinge of blood as pt states similar to 2 prior bouts of sbp.  Sent fluid for cx, glucose, albumin, gram stain, cell count.  No records yet from [facility].      12:50 AM  D/w Dr. Lester Carolina, hospitalist.  He will see pt.  Ascitic fluid analysis pending.  He is aware of such.  He will evaluate patient.  Ashley Akin, MD      Diagnostic paracentesis  Date/Time: 10/14/2015 12:21 AM  Performed by: Ashley Akin  Authorized by: Ashley Akin     Consent:     Consent obtained:  Written    Consent given by:  Patient    Risks discussed:  Bleeding, infection and pain (perforation of bowel)    Alternatives discussed:  No treatment (antibiotics without culture)  Indications:     Indications:  Abdominal pain and fever  Pre-procedure details:     Skin preparation:  Betadine    Preparation: Patient was prepped and draped in the usual sterile fashion    Anesthesia (see MAR for exact dosages):     Anesthesia method:  Local infiltration    Local anesthetic:  Lidocaine 1% w/o epi  Post-procedure details:     Patient tolerance of procedure:  Tolerated well, no immediate complications  Comments:      Local anesthetic 1% 57m used.  Area marked by ultrasound tech and  confirmed by uKoreaby me prior to procedure. inserted 2 inch 18 g needle while aspirating without success in z fashion upon entering skin.  inserted 3.5 inch 18 g needle while aspirating as pocket of fluid about 6 cm deep.  Needle entered in z fashion again.  Withdraw 35 mL of cloudy ascitic fluid with tinge of  blood.  Fluid sent to lab.          Recent Results (from the past 24 hour(s))   CBC WITH AUTOMATED DIFF    Collection Time: 10/13/15  9:38 PM   Result Value Ref Range    WBC 6.4 4.1 - 11.1 K/uL    RBC 2.92 (L) 4.10 - 5.70 M/uL    HGB 8.4 (L) 12.1 - 17.0 g/dL    HCT 27.7 (L) 36.6 - 50.3 %    MCV 94.9 80.0 - 99.0 FL    MCH 28.8 26.0 - 34.0 PG    MCHC 30.3 30.0 - 36.5 g/dL    RDW 17.2 (H) 11.5 - 14.5 %    PLATELET 169 150 - 400 K/uL    NEUTROPHILS 70 32 - 75 %    LYMPHOCYTES 12 12 - 49 %    MONOCYTES 7 5 - 13 %    EOSINOPHILS 10 (H) 0 - 7 %    BASOPHILS 1 0 - 1 %    ABS. NEUTROPHILS 4.5 1.8 - 8.0 K/UL    ABS. LYMPHOCYTES 0.8 0.8 - 3.5 K/UL    ABS. MONOCYTES 0.4 0.0 - 1.0 K/UL    ABS. EOSINOPHILS 0.6 (H) 0.0 - 0.4 K/UL    ABS. BASOPHILS 0.1 0.0 - 0.1 K/UL    DF SMEAR SCANNED      RBC COMMENTS ANISOCYTOSIS  1+        RBC COMMENTS POLYCHROMASIA  1+        RBC COMMENTS OVALOCYTES  PRESENT        RBC COMMENTS SCHISTOCYTES  PRESENT        RBC COMMENTS RBC FRAGMENTS     METABOLIC PANEL, COMPREHENSIVE    Collection Time: 10/13/15  9:38 PM   Result Value Ref Range    Sodium 138 136 - 145 mmol/L    Potassium 5.0 3.5 - 5.1 mmol/L    Chloride 105 97 - 108 mmol/L    CO2 19 (L) 21 - 32 mmol/L    Anion gap 14 5 - 15 mmol/L    Glucose 95 65 - 100 mg/dL    BUN 92 (H) 6 - 20 MG/DL    Creatinine 12.50 (H) 0.70 - 1.30 MG/DL    BUN/Creatinine ratio 7 (L) 12 - 20      GFR est AA 5 (L) >60 ml/min/1.23m    GFR est non-AA 4 (L) >60 ml/min/1.731m   Calcium 7.0 (L) 8.5 - 10.1 MG/DL    Bilirubin, total 0.3 0.2 - 1.0 MG/DL    ALT (SGPT) 18 12 - 78 U/L    AST (SGOT) 24 15 - 37 U/L    Alk. phosphatase 234 (H) 45 - 117 U/L     Protein, total 6.0 (L) 6.4 - 8.2 g/dL    Albumin 2.6 (L) 3.5 - 5.0 g/dL    Globulin 3.4 2.0 - 4.0 g/dL    A-G Ratio 0.8 (L) 1.1 - 2.2     LIPASE    Collection Time: 10/13/15  9:38 PM   Result Value Ref Range    Lipase 465 (H) 73 - 393 U/L   PROTHROMBIN TIME + INR    Collection Time: 10/13/15  9:38 PM   Result Value Ref Range    INR 1.1 0.9 - 1.1      Prothrombin time 11.2 (H) 9.0 - 11.1 sec   PTT    Collection Time: 10/13/15  9:38 PM   Result Value Ref Range  aPTT 32.0 22.1 - 32.5 sec    aPTT, therapeutic range     58.0 - 77.0 SECS   LACTIC ACID, PLASMA    Collection Time: 10/13/15 10:50 PM   Result Value Ref Range    Lactic acid 0.9 0.4 - 2.0 MMOL/L       Xr Chest Pa Lat    Result Date: 10/13/2015  INDICATION: fever, sob EXAM: CXR 2 View FINDINGS: Frontal and lateral views of the chest show linear scarring or discoid atelectasis at the right base but no pulmonary consolidation. Heart is large. There is no pulmonary edema, pneumothorax or pleural effusion. There is right subclavian vascular stent.     IMPRESSION: No acute findings.    Ct Abd Pelv Wo Cont    Result Date: 10/13/2015  INDICATION: abd distention EXAM: CT Abdomen and CT Pelvis without contrast. CT dose reduction was achieved through use of a standardized protocol tailored for this examination and automatic exposure control for dose modulation. FINDINGS: There is a large amount of ascites or dialysate. There is generalized edema of the body wall. Kidneys are atrophic. No urinary tract stones are seen. There is no hydroureteronephrosis. Liver shows no apparent significant finding without contrast. Pancreas, adrenal glands, spleen and aorta show no significant enlargement.  No focal inflammation is seen. There is no free air or substantial adenopathy. The bladder is empty. There is no apparent pelvic mass. The appendix is normal.     IMPRESSION: Large amount of ascites or dialysate.    [place] [employer]    Result Date: 10/13/2015   INDICATION: abd distention and fever, dx paracentesis EXAM: Ultrasound abdomen limited. FINDINGS: Sonography of the 4 quadrants of the abdomen show a large quantity of simple appearing ascites, also demonstrated on today's CT.     IMPRESSION: Large volume of ascites.

## 2015-10-14 ENCOUNTER — Inpatient Hospital Stay
Admit: 2015-10-14 | Discharge: 2015-10-17 | Disposition: A | Discharge status: Home / Self Care | Payer: MEDICARE | Attending: Internal Medicine | Admitting: Internal Medicine

## 2015-10-14 ENCOUNTER — Encounter: Admit: 2015-10-15 | Discharge: 2015-10-15 | Payer: MEDICARE | Attending: Gastroenterology

## 2015-10-14 LAB — METABOLIC PANEL, COMPREHENSIVE
A-G Ratio: 0.8 — ABNORMAL LOW (ref 1.1–2.2)
A-G Ratio: 0.7 — ABNORMAL LOW (ref 1.1–2.2)
ALT (SGPT): 18 U/L (ref 12–78)
ALT (SGPT): 18 U/L (ref 12–78)
AST (SGOT): 24 U/L (ref 15–37)
AST (SGOT): 24 U/L (ref 15–37)
Albumin: 2.6 g/dL — ABNORMAL LOW (ref 3.5–5.0)
Albumin: 2.5 g/dL — ABNORMAL LOW (ref 3.5–5.0)
Alk. phosphatase: 234 U/L — ABNORMAL HIGH (ref 45–117)
Alk. phosphatase: 227 U/L — ABNORMAL HIGH (ref 45–117)
Anion gap: 14 mmol/L (ref 5–15)
Anion gap: 12 mmol/L (ref 5–15)
BUN/Creatinine ratio: 7 — ABNORMAL LOW (ref 12–20)
BUN/Creatinine ratio: 5 — ABNORMAL LOW (ref 12–20)
BUN: 92 MG/DL — ABNORMAL HIGH (ref 6–20)
BUN: 62 MG/DL — ABNORMAL HIGH (ref 6–20)
Bilirubin, total: 0.3 MG/DL (ref 0.2–1.0)
Bilirubin, total: 0.3 MG/DL (ref 0.2–1.0)
CO2: 19 mmol/L — ABNORMAL LOW (ref 21–32)
CO2: 18 mmol/L — ABNORMAL LOW (ref 21–32)
Calcium: 7 MG/DL — ABNORMAL LOW (ref 8.5–10.1)
Calcium: 6.9 MG/DL — ABNORMAL LOW (ref 8.5–10.1)
Chloride: 105 mmol/L (ref 97–108)
Chloride: 109 mmol/L — ABNORMAL HIGH (ref 97–108)
Creatinine: 12.5 MG/DL — ABNORMAL HIGH (ref 0.70–1.30)
Creatinine: 12.9 MG/DL — ABNORMAL HIGH (ref 0.70–1.30)
GFR est AA: 5 mL/min/{1.73_m2} — ABNORMAL LOW (ref >60)
GFR est AA: 5 mL/min/{1.73_m2} — ABNORMAL LOW (ref >60)
GFR est non-AA: 4 mL/min/{1.73_m2} — ABNORMAL LOW (ref >60)
GFR est non-AA: 4 mL/min/{1.73_m2} — ABNORMAL LOW (ref >60)
Globulin: 3.4 g/dL (ref 2.0–4.0)
Globulin: 3.4 g/dL (ref 2.0–4.0)
Glucose: 95 mg/dL (ref 65–100)
Glucose: 92 mg/dL (ref 65–100)
Potassium: 5 mmol/L (ref 3.5–5.1)
Potassium: 5.7 mmol/L — ABNORMAL HIGH (ref 3.5–5.1)
Protein, total: 6 g/dL — ABNORMAL LOW (ref 6.4–8.2)
Protein, total: 5.9 g/dL — ABNORMAL LOW (ref 6.4–8.2)
Sodium: 138 mmol/L (ref 136–145)
Sodium: 139 mmol/L (ref 136–145)

## 2015-10-14 LAB — CBC WITH AUTOMATED DIFF
ABS. BASOPHILS: 0.1 10*3/uL (ref 0.0–0.1)
ABS. BASOPHILS: 0.1 10*3/uL (ref 0.0–0.1)
ABS. EOSINOPHILS: 0.6 10*3/uL — ABNORMAL HIGH (ref 0.0–0.4)
ABS. EOSINOPHILS: 0.6 10*3/uL — ABNORMAL HIGH (ref 0.0–0.4)
ABS. LYMPHOCYTES: 0.8 10*3/uL (ref 0.8–3.5)
ABS. LYMPHOCYTES: 0.7 10*3/uL — ABNORMAL LOW (ref 0.8–3.5)
ABS. MONOCYTES: 0.4 10*3/uL (ref 0.0–1.0)
ABS. MONOCYTES: 0.5 10*3/uL (ref 0.0–1.0)
ABS. NEUTROPHILS: 4.5 10*3/uL (ref 1.8–8.0)
ABS. NEUTROPHILS: 3.7 10*3/uL (ref 1.8–8.0)
BASOPHILS: 1 % (ref 0–1)
BASOPHILS: 1 % (ref 0–1)
EOSINOPHILS: 10 % — ABNORMAL HIGH (ref 0–7)
EOSINOPHILS: 11 % — ABNORMAL HIGH (ref 0–7)
HCT: 27.7 % — ABNORMAL LOW (ref 36.6–50.3)
HCT: 26.5 % — ABNORMAL LOW (ref 36.6–50.3)
HGB: 8.4 g/dL — ABNORMAL LOW (ref 12.1–17.0)
HGB: 8.1 g/dL — ABNORMAL LOW (ref 12.1–17.0)
LYMPHOCYTES: 12 % (ref 12–49)
LYMPHOCYTES: 13 % (ref 12–49)
MCH: 28.8 PG (ref 26.0–34.0)
MCH: 29 PG (ref 26.0–34.0)
MCHC: 30.3 g/dL (ref 30.0–36.5)
MCHC: 30.6 g/dL (ref 30.0–36.5)
MCV: 94.9 FL (ref 80.0–99.0)
MCV: 95 FL (ref 80.0–99.0)
MONOCYTES: 7 % (ref 5–13)
MONOCYTES: 9 % (ref 5–13)
NEUTROPHILS: 70 % (ref 32–75)
NEUTROPHILS: 66 % (ref 32–75)
PLATELET: 169 10*3/uL (ref 150–400)
PLATELET: 167 10*3/uL (ref 150–400)
RBC: 2.92 M/uL — ABNORMAL LOW (ref 4.10–5.70)
RBC: 2.79 M/uL — ABNORMAL LOW (ref 4.10–5.70)
RDW: 17.2 % — ABNORMAL HIGH (ref 11.5–14.5)
RDW: 17.1 % — ABNORMAL HIGH (ref 11.5–14.5)
WBC: 6.4 10*3/uL (ref 4.1–11.1)
WBC: 5.5 10*3/uL (ref 4.1–11.1)

## 2015-10-14 LAB — PROTEIN TOTAL, FLUID: Protein total, body fld.: 2.6 g/dL

## 2015-10-14 LAB — CELL COUNT, BODY FLUID
FLUID RBC CT.: >100 /mm3
FLUID WBC COUNT: 4 /mm3 (ref 0–5)

## 2015-10-14 LAB — MAGNESIUM: Magnesium: 2.3 mg/dL (ref 1.6–2.4)

## 2015-10-14 LAB — LACTIC ACID: Lactic acid: 0.9 MMOL/L (ref 0.4–2.0)

## 2015-10-14 LAB — TSH 3RD GENERATION: TSH: 3.74 u[IU]/mL (ref 0.36–3.74)

## 2015-10-14 LAB — PTT: aPTT: 32 s (ref 22.1–32.5)

## 2015-10-14 LAB — GLUCOSE, FLUID: Glucose, body fld.: 98 MG/DL

## 2015-10-14 LAB — ALBUMIN, FLUID: Albumin, body fld.: 1.4 g/dL

## 2015-10-14 LAB — PHOSPHORUS: Phosphorus: 6.7 MG/DL — ABNORMAL HIGH (ref 2.6–4.7)

## 2015-10-14 LAB — C. DIFFICILE (DNA): C. difficile (DNA): NEGATIVE

## 2015-10-14 LAB — LIPASE: Lipase: 465 U/L — ABNORMAL HIGH (ref 73–393)

## 2015-10-14 LAB — PROTHROMBIN TIME + INR
INR: 1.1 (ref 0.9–1.1)
Prothrombin time: 11.2 s — ABNORMAL HIGH (ref 9.0–11.1)

## 2015-10-14 MED ORDER — EPOETIN ALFA 10,000 UNIT/ML IJ SOLN
10000 unit/mL | INTRAMUSCULAR | Status: DC
Start: 2015-10-14 — End: 2015-10-17

## 2015-10-14 MED ORDER — CARVEDILOL 12.5 MG TAB
12.5 mg | Freq: Two times a day (BID) | ORAL | Status: DC
Start: 2015-10-14 — End: 2015-10-17
  Administered 2015-10-14 – 2015-10-17 (×3): via ORAL

## 2015-10-14 MED ORDER — SEVELAMER CARBONATE 800 MG TAB
800 mg | Freq: Three times a day (TID) | ORAL | Status: DC
Start: 2015-10-14 — End: 2015-10-17
  Administered 2015-10-14 – 2015-10-17 (×10): via ORAL

## 2015-10-14 MED ORDER — HYDRALAZINE 50 MG TAB
50 mg | Freq: Three times a day (TID) | ORAL | Status: DC
Start: 2015-10-14 — End: 2015-10-17
  Administered 2015-10-14 – 2015-10-17 (×6): via ORAL

## 2015-10-14 MED ORDER — HYDROMORPHONE (PF) 1 MG/ML IJ SOLN
1 mg/mL | INTRAMUSCULAR | Status: AC
Start: 2015-10-14 — End: 2015-10-13
  Administered 2015-10-14: 04:00:00 via INTRAVENOUS

## 2015-10-14 MED ORDER — ALBUTEROL SULFATE 0.083 % (0.83 MG/ML) SOLN FOR INHALATION
2.5 mg /3 mL (0.083 %) | RESPIRATORY_TRACT | Status: DC | PRN
Start: 2015-10-14 — End: 2015-10-17

## 2015-10-14 MED ORDER — ONDANSETRON (PF) 4 MG/2 ML INJECTION
4 mg/2 mL | Freq: Once | INTRAMUSCULAR | Status: AC
Start: 2015-10-14 — End: 2015-10-13
  Administered 2015-10-14: 03:00:00 via INTRAVENOUS

## 2015-10-14 MED ORDER — ALBUMIN, HUMAN 25 % IV
25 % | INTRAVENOUS | Status: DC | PRN
Start: 2015-10-14 — End: 2015-10-17

## 2015-10-14 MED ORDER — HYDROMORPHONE (PF) 1 MG/ML IJ SOLN
1 mg/mL | INTRAMUSCULAR | Status: DC | PRN
Start: 2015-10-14 — End: 2015-10-14
  Administered 2015-10-14 (×3): via INTRAVENOUS

## 2015-10-14 MED ORDER — LIDOCAINE HCL 1 % (10 MG/ML) IJ SOLN
10 mg/mL (1 %) | Freq: Once | INTRAMUSCULAR | Status: AC
Start: 2015-10-14 — End: 2015-10-13
  Administered 2015-10-14: 04:00:00 via INTRADERMAL

## 2015-10-14 MED ORDER — METRONIDAZOLE IN SODIUM CHLORIDE (ISO-OSM) 500 MG/100 ML IV PIGGY BACK
500 mg/100 mL | Freq: Three times a day (TID) | INTRAVENOUS | Status: DC
Start: 2015-10-14 — End: 2015-10-15
  Administered 2015-10-14 – 2015-10-15 (×4): via INTRAVENOUS

## 2015-10-14 MED ORDER — PANTOPRAZOLE 40 MG TAB, DELAYED RELEASE
40 mg | Freq: Every day | ORAL | Status: DC
Start: 2015-10-14 — End: 2015-10-14
  Administered 2015-10-14: 13:00:00 via ORAL

## 2015-10-14 MED ORDER — ONDANSETRON (PF) 4 MG/2 ML INJECTION
4 mg/2 mL | INTRAMUSCULAR | Status: DC | PRN
Start: 2015-10-14 — End: 2015-10-17
  Administered 2015-10-14: 20:00:00 via INTRAVENOUS

## 2015-10-14 MED ORDER — L.ACIDOPH & PARACASEI-S.THERMOPHIL-BIFIDOBACTERIUM 8 BILLION CELL CAP
8 billion cell | Freq: Every day | ORAL | Status: DC
Start: 2015-10-14 — End: 2015-10-17
  Administered 2015-10-14 – 2015-10-16 (×3): via ORAL

## 2015-10-14 MED ORDER — ACETAMINOPHEN 325 MG TABLET
325 mg | ORAL | Status: DC | PRN
Start: 2015-10-14 — End: 2015-10-17

## 2015-10-14 MED ORDER — HYDROMORPHONE 2 MG TAB
2 mg | ORAL | Status: DC | PRN
Start: 2015-10-14 — End: 2015-10-14

## 2015-10-14 MED ORDER — AMLODIPINE 5 MG TAB
5 mg | Freq: Every day | ORAL | Status: DC
Start: 2015-10-14 — End: 2015-10-17
  Administered 2015-10-17: 14:00:00 via ORAL

## 2015-10-14 MED ORDER — VANCOMYCIN ORAL SOLUTION 50 MG/ML CPD (RX COMPOUNDED)
50 mg/mL | Freq: Four times a day (QID) | ORAL | Status: DC
Start: 2015-10-14 — End: 2015-10-15
  Administered 2015-10-14 – 2015-10-15 (×5): via ORAL

## 2015-10-14 MED ORDER — HYDROMORPHONE (PF) 1 MG/ML IJ SOLN
1 mg/mL | INTRAMUSCULAR | Status: DC | PRN
Start: 2015-10-14 — End: 2015-10-15
  Administered 2015-10-14 – 2015-10-15 (×3): via INTRAVENOUS

## 2015-10-14 MED ORDER — SODIUM CHLORIDE 0.9 % IV
INTRAVENOUS | Status: DC
Start: 2015-10-14 — End: 2015-10-14
  Administered 2015-10-14: 08:00:00 via INTRAVENOUS

## 2015-10-14 MED ORDER — SODIUM CHLORIDE 0.9 % IV
INTRAVENOUS | Status: DC | PRN
Start: 2015-10-14 — End: 2015-10-17

## 2015-10-14 MED ORDER — HEPARIN (PORCINE) 5,000 UNIT/ML IJ SOLN
5000 unit/mL | Freq: Three times a day (TID) | INTRAMUSCULAR | Status: DC
Start: 2015-10-14 — End: 2015-10-17
  Administered 2015-10-16: 02:00:00 via SUBCUTANEOUS

## 2015-10-14 MED ORDER — NICOTINE 21 MG/24 HR DAILY PATCH
21 mg/24 hr | TRANSDERMAL | Status: DC
Start: 2015-10-14 — End: 2015-10-17

## 2015-10-14 MED ORDER — HYDROMORPHONE (PF) 1 MG/ML IJ SOLN
1 mg/mL | INTRAMUSCULAR | Status: AC
Start: 2015-10-14 — End: 2015-10-13
  Administered 2015-10-14: 03:00:00 via INTRAVENOUS

## 2015-10-14 MED FILL — METRONIDAZOLE IN SODIUM CHLORIDE (ISO-OSM) 500 MG/100 ML IV PIGGY BACK: 500 mg/100 mL | INTRAVENOUS | Qty: 100

## 2015-10-14 MED FILL — RENVELA 800 MG TABLET: 800 mg | ORAL | Qty: 1

## 2015-10-14 MED FILL — SODIUM CHLORIDE 0.9 % IV: INTRAVENOUS | Qty: 1000

## 2015-10-14 MED FILL — HYDRALAZINE 50 MG TAB: 50 mg | ORAL | Qty: 2

## 2015-10-14 MED FILL — HYDROMORPHONE (PF) 1 MG/ML IJ SOLN: 1 mg/mL | INTRAMUSCULAR | Qty: 1

## 2015-10-14 MED FILL — ONDANSETRON (PF) 4 MG/2 ML INJECTION: 4 mg/2 mL | INTRAMUSCULAR | Qty: 2

## 2015-10-14 MED FILL — CARVEDILOL 12.5 MG TAB: 12.5 mg | ORAL | Qty: 1

## 2015-10-14 MED FILL — AMLODIPINE 5 MG TAB: 5 mg | ORAL | Qty: 2

## 2015-10-14 MED FILL — VANCOMYCIN ORAL SOLUTION 50 MG/ML CPD (RX COMPOUNDED): 50 mg/mL | ORAL | Qty: 5

## 2015-10-14 MED FILL — PANTOPRAZOLE 40 MG TAB, DELAYED RELEASE: 40 mg | ORAL | Qty: 1

## 2015-10-14 MED FILL — LIDOCAINE HCL 1 % (10 MG/ML) IJ SOLN: 10 mg/mL (1 %) | INTRAMUSCULAR | Qty: 20

## 2015-10-14 MED FILL — RISAQUAD 8 BILLION CELL CAPSULE: 8 billion cell | ORAL | Qty: 1

## 2015-10-14 NOTE — Progress Notes (Signed)
Hospitalist f/u:  Patient reports return of nausea and diarrhea, as well as severe abdominal pain.  He reports that PO Dilaudid is not working, "it takes too long".   Patient told the nurse he wants to leave AMA, but after I spoke with him, he agrees to stay.     Sherlynn CarbonI. Candia Kingsbury, M.D.

## 2015-10-14 NOTE — Consults (Signed)
Infectious Disease Consult    Today's Date: 10/14/2015   Admit Date: 10/13/2015    Impression:   ?? Recurrent bouts of C diff since December--not sure that is what he has now  ?? ESRD  ?? Ascites of uncertain origin    Plan:   ?? Continue current therapy  ?? ? Value of flex sig or other endoscopy  ?? ? therapeutic paracentesis     Anti-infectives:     Inpat Anti-Infectives     Start     Ordered Stop    10/14/15 0600  vancomycin 50 mg/mL oral solution (compounded) 250 mg  250 mg,   Oral,   EVERY 6 HOURS      10/14/15 0336 --    10/14/15 0400  metroNIDAZOLE (FLAGYL) IVPB premix 500 mg  500 mg,   IntraVENous,   EVERY 8 HOURS      10/14/15 0336 --          Subjective:   Date of Consultation:  October 14, 2015  Referring Physician: Dr Sherlynn Carbon    Patient is a 45 y.o. male admitted with abdominal pain and diarrhea. He states that he has had recurrent episodes of C diff since being treated for pneumonia this past December. He states that he has about one episode a month, just after finishing a course of therapy. He has been on Flagyl, vancomycin and Dificid. He is admitted with ascites (a chronic problem) and loose bowels. He has not had a diarrheal stool in the hospital.     Patient Active Problem List   Diagnosis Code   ??? Gastroenteritis K52.9     Past Medical History:   Diagnosis Date   ??? Ascites    ??? Asthma    ??? C. difficile colitis    ??? CKD (chronic kidney disease) stage V requiring chronic dialysis (HCC)    ??? HTN (hypertension)    ??? SBP (spontaneous bacterial peritonitis) (H[facility])       History reviewed. No pertinent family history.   Social History   Substance Use Topics   ??? Smoking status: Heavy Tobacco Smoker     Packs/day: 0.50   ??? Smokeless tobacco: Never Used   ??? Alcohol use No     History reviewed. No pertinent surgical history.   Prior to Admission medications    Medication Sig Start Date End Date Taking? Authorizing Provider   sevelamer (RENAGEL) 400 mg tablet Take 800 mg by mouth three (3) times  daily (with meals).   Yes Phys Other, MD   carvedilol (COREG) 12.5 mg tablet Take  by mouth two (2) times daily (with meals).   Yes Phys Other, MD   hydrALAZINE (APRESOLINE) 100 mg tablet Take 100 mg by mouth three (3) times daily.   Yes Phys Other, MD   amLODIPine (NORVASC) 10 mg tablet Take 10 mg by mouth daily.   Yes Phys Other, MD   pantoprazole (PROTONIX) 40 mg tablet Take 40 mg by mouth daily.   Yes Phys Other, MD   furosemide (LASIX) 80 mg tablet Take 80 mg by mouth daily.   Yes Phys Other, MD   albuterol (PROVENTIL HFA, VENTOLIN HFA, PROAIR HFA) 90 mcg/actuation inhaler Take 2 Puffs by inhalation every four (4) hours as needed for Wheezing.   Yes Phys Other, MD       Allergies   Allergen Reactions   ??? Lisinopril Shortness of Breath   ??? Morphine Shortness of Breath   ??? Toradol [Ketorolac] Shortness of Breath  Review of Systems:  Pertinent items are noted in the History of Present Illness.    Objective:     Visit Vitals   ??? BP 156/85 (BP 1 Location: Right arm, BP Patient Position: At rest)   ??? Pulse 86   ??? Temp 98.9 ??F (37.2 ??C)   ??? Resp 16   ??? Ht 6\' 1"  (1.854 m)   ??? Wt 113.4 kg (250 lb)   ??? SpO2 99%   ??? BMI 32.98 kg/m2     Temp (24hrs), Avg:98.2 ??F (36.8 ??C), Min:97.3 ??F (36.3 ??C), Max:98.9 ??F (37.2 ??C)       Lines:  Peripheral IV:            Physical Exam:  General:  alert, cooperative, no distress, appears stated age  Lungs:  clear to auscultation bilaterally  Heart:  regular rate and rhythm  Abdomen:  abnormal findings:  distended, tenderness mild in the entire abdomen  Skin:  no rash or abnormalities    Data Review:     CBC:  Recent Labs      10/14/15   0555  10/13/15   2138   WBC  5.5  6.4   GRANS  66  70   MONOS  9  7   EOS  11*  10*   ANEU  3.7  4.5   ABL  0.7*  0.8   HGB  8.1*  8.4*   HCT  26.5*  27.7*   PLT  167  169       BMP:  Recent Labs      10/14/15   0555  10/13/15   2138   CREA  12.90*  12.50*   BUN  62*  92*   NA  139  138   K  5.7*  5.0   CL  109*  105   CO2  18*  19*   AGAP  12  14    GLU  92  95       LFTS:  Recent Labs      10/14/15   0555  10/13/15   2138   TBILI  0.3  0.3   ALT  18  18   SGOT  24  24   AP  227*  234*   TP  5.9*  6.0*   ALB  2.5*  2.6*       Microbiology:     All Micro Results     Procedure Component Value Units Date/Time    CULTURE, STOOL [[id number]] Collected:  10/14/15 1053    Order Status:  Completed Specimen:  Stool Updated:  10/14/15 1618     Special Requests: NO SPECIAL REQUESTS        Campylobacter antigen NEGATIVE        Culture result: PENDING    C. DIFFICILE (DNA) [[id number]] Collected:  10/14/15 1548    Order Status:  Completed Updated:  10/14/15 1605    C. DIFFICILE (DNA) [[id number]] Collected:  10/14/15 1053    Order Status:  Canceled Updated:  10/14/15 1108    OVA & PARASITES, STOOL [[id number]] Collected:  10/14/15 1053    Order Status:  Completed Specimen:  Stool from Stool Updated:  10/14/15 1108    MRSA CULTURE [[id number]] Collected:  10/14/15 0408    Order Status:  Completed Specimen:  Nares Updated:  10/14/15 0631    CULTURE, BLOOD, PAIRED [[id number]] Collected:  10/13/15 2230    Order Status:  Completed Specimen:  Blood Updated:  10/14/15 0628     Special Requests: NO SPECIAL REQUESTS        Culture result: NO GROWTH AFTER 5 HOURS       CULTURE, BODY FLUID Gay FillerW GRAM STAIN [[id number]][[id number]] Collected:  10/14/15 0022    Order Status:  Completed Specimen:  Paracentesis Fluid Updated:  10/14/15 0600     Special Requests: NO SPECIAL REQUESTS        GRAM STAIN OCCASIONAL  WBCS SEEN         NO ORGANISMS SEEN        Culture result: PENDING    C. DIFFICILE (DNA) [[id number]] Collected:  10/14/15 0030    Order Status:  Canceled     C. DIFFICILE (DNA) [[id number]]     Order Status:  Canceled     CULTURE, BLOOD [[id number]][[id number]] Collected:  10/13/15 2250    Order Status:  Canceled Updated:  10/13/15 2334          Imaging:   Results noted    Signed By: Bradly Chrisavid Amany Rando, MD     October 14, 2015

## 2015-10-14 NOTE — Other (Addendum)
David [name] & O x 3. VSS. Lungs diminished bilaterally, cardiac negative, mild gerealized edema noted. BP via right arm 163/75, RR18. Left AVF positive bruit and thrill. David gave warning of access not functioning properly before pt. setup began. No signs of access being poor functioning upon assessment or during the access of AVF. Cannulated AVF with ease. Aspirated and flushed with ease.David began grimmacing and requesting needles to be removed. Educated pt. on his need for dialysis related to his high potassium and missing Monday's treatment. Pt still requested treatment to be terminated. Dr. Allena KatzPatel called and notified, he spoke with pt about his concerns and his need for treatment. Pt still declines treatment at this time. Possible declot or catheter placement in AM. [facility] notified of pt's need for treatment first thing in the morning per MD order.

## 2015-10-14 NOTE — Consults (Addendum)
[place]    [facility]   [address]  [place] [id number]        GASTROENTEROLOGY CONSULTATION Denese KillingsOTEElizabeth Adams ACNP  [phone]      NAME:  David [name]   DOB:   Dec 16, 1970   MRN:   [id number]       Referring Physician: Noralyn Pickazaak A Eniola, MD    Consult Date: 10/14/2015 2:01 PM    Chief Complaint: abdominal pain and diarrhea     History of Present Illness:  Patient is a 45 y.o. gentlemen who is seen in consultation at the request of Dr. Lum BabeEniola for abdominal pain and diarrhea.  Mr. David [name] is complaining of abdominal pain and frequent diarrhea. He endorses having liquid bowel movements 6-8 x day. Recently went to an outside hospital where he was told he had C.diff but signed out AMA. He states that since December 2016 he has been suffering from recurrent episodes of C.diff. He does not have a SolicitorGastroenterologist. When he begins having symptoms of frequent diarrhea he usually [facility].  Also, has recently been suffering from ascites.  He does not currently see a hepatologist, but is undergoing [facility].        PMH:  Past Medical History:   Diagnosis Date   ??? Ascites    ??? Asthma    ??? C. difficile colitis    ??? CKD (chronic kidney disease) stage V requiring chronic dialysis (HCC)    ??? HTN (hypertension)    ??? SBP (spontaneous bacterial peritonitis) (HCC)        PSH:  History reviewed. No pertinent surgical history.    Allergies:  Allergies   Allergen Reactions   ??? Lisinopril Shortness of Breath   ??? Morphine Shortness of Breath   ??? Toradol [Ketorolac] Shortness of Breath       Home Medications:  Prior to Admission Medications   Prescriptions Last Dose Informant Patient Reported? Taking?   albuterol (PROVENTIL HFA, VENTOLIN HFA, PROAIR HFA) 90 mcg/actuation inhaler 10/13/2015 at Unknown time  Yes Yes   Sig: Take 2 Puffs by inhalation every four (4) hours as needed for Wheezing.    amLODIPine (NORVASC) 10 mg tablet 10/13/2015 at Unknown time  Yes Yes   Sig: Take 10 mg by mouth daily.   carvedilol (COREG) 12.5 mg tablet 10/13/2015 at Unknown time  Yes Yes   Sig: Take  by mouth two (2) times daily (with meals).   furosemide (LASIX) 80 mg tablet 10/13/2015 at Unknown time  Yes Yes   Sig: Take 80 mg by mouth daily.   hydrALAZINE (APRESOLINE) 100 mg tablet 10/13/2015 at Unknown time  Yes Yes   Sig: Take 100 mg by mouth three (3) times daily.   pantoprazole (PROTONIX) 40 mg tablet 10/13/2015 at Unknown time  Yes Yes   Sig: Take 40 mg by mouth daily.   sevelamer (RENAGEL) 400 mg tablet 10/13/2015 at Unknown time  Yes Yes   Sig: Take 800 mg by mouth three (3) times daily (with meals).      Facility-Administered Medications: None       Hospital Medications:  Current Facility-Administered Medications   Medication Dose Route Frequency   ??? albuterol (PROVENTIL VENTOLIN) nebulizer solution 2.5 mg  2.5 mg Nebulization Q4H PRN   ??? amLODIPine (NORVASC) tablet 10 mg  10 mg Oral DAILY   ??? carvedilol (COREG) tablet 12.5 mg  12.5 mg Oral BID WITH MEALS   ???  hydrALAZINE (APRESOLINE) tablet 100 mg  100 mg Oral TID   ??? pantoprazole (PROTONIX) tablet 40 mg  40 mg Oral DAILY   ??? sevelamer carbonate (RENVELA) tab 800 mg  800 mg Oral TID WITH MEALS   ??? acetaminophen (TYLENOL) tablet 650 mg  650 mg Oral Q4H PRN   ??? HYDROmorphone (PF) (DILAUDID) injection 1 mg  1 mg IntraVENous Q4H PRN   ??? ondansetron (ZOFRAN) injection 4 mg  4 mg IntraVENous Q4H PRN   ??? heparin (porcine) injection 5,000 Units  5,000 Units SubCUTAneous Q8H   ??? metroNIDAZOLE (FLAGYL) IVPB premix 500 mg  500 mg IntraVENous Q8H   ??? lactobac ac& pc-s.therm-b.anim (FLORA Q/RISAQUAD)  1 Cap Oral DAILY   ??? vancomycin 50 mg/mL oral solution (compounded) 250 mg  250 mg Oral Q6H   ??? nicotine (NICODERM CQ) 21 mg/24 hr patch 1 Patch  1 Patch TransDERmal Q24H   ??? 0.9% sodium chloride infusion  100 mL/hr IntraVENous DIALYSIS PRN    ??? albumin human 25% (BUMINATE) solution 12.5 g  12.5 g IntraVENous DIALYSIS PRN   ??? [START ON 10/15/2015] epoetin alfa (EPOGEN;PROCRIT) injection 10,000 Units  10,000 Units SubCUTAneous Q MON, WED & FRI       Social History:  Social History   Substance Use Topics   ??? Smoking status: Heavy Tobacco Smoker     Packs/day: 0.50   ??? Smokeless tobacco: Never Used   ??? Alcohol use No       Family History:  History reviewed. No pertinent family history.    Review of Systems:  Constitutional: negative fever, negative chills, negative weight loss  Eyes:   negative visual changes  ENT:   negative sore throat, tongue or lip swelling  Respiratory:  negative cough, negative dyspnea  Cards:  negative for chest pain, palpitations, lower extremity edema  GI:   See HPI  GU:  negative for frequency, dysuria  Integument:  negative for rash and pruritus  Heme:  negative for easy bruising and gum/nose bleeding  Musculoskel: negative for myalgias,  back pain and muscle weakness  Neuro: negative for headaches, dizziness, vertigo  Psych:  negative for feelings of anxiety, depression     Objective:   Patient Vitals for the past 8 hrs:   BP Temp Pulse Resp SpO2   10/14/15 0841 (!) 181/105 97.3 ??F (36.3 ??C) 91 16 99 %             EXAM: CONST-44 yo male lying in bed    NEURO-alert and oriented x 3   HEENT-PERLA, EOMI, no scleral icterus   LUNGS-clear to ausculation   CARD-S1 S2   ABD-firm, tender, distended, bowel sounds (+) all 4 quadrants, no masses   EXT-no edema, warm   PSYCH - full, not anxious     Data Review     Recent Labs      10/14/15   0555  10/13/15   2138   WBC  5.5  6.4   HGB  8.1*  8.4*   HCT  26.5*  27.7*   PLT  167  169     Recent Labs      10/14/15   0555  10/13/15   2138   NA  139  138   K  5.7*  5.0   CL  109*  105   CO2  18*  19*   BUN  62*  92*   CREA  12.90*  12.50*   GLU  92  95  PHOS  6.7*   --    CA  6.9*  7.0*     Recent Labs      10/14/15   0555  10/13/15   2138   SGOT  24  24   AP  227*  234*   TP  5.9*  6.0*    ALB  2.5*  2.6*   GLOB  3.4  3.4   LPSE   --   465*     Recent Labs      10/13/15   2138   INR  1.1   PTP  11.2*   APTT  32.0     CT ABD PELV WO CONT  FINDINGS:   There is a large amount of ascites or dialysate. There is generalized edema of  the body wall.  Kidneys are atrophic. No urinary tract stones are seen. There is no  hydroureteronephrosis.   Liver shows no apparent significant finding without contrast. Pancreas, adrenal  glands, spleen and aorta show no significant enlargement.  No focal inflammation  is seen. There is no free air or substantial adenopathy.  The bladder is empty. There is no apparent pelvic mass. The appendix is normal.  ??  IMPRESSION: Large amount of ascites or dialysate.    [place] ABD  LTD  FINDINGS: Sonography of the 4 quadrants of the abdomen show a large quantity of  simple appearing ascites, also demonstrated on today's CT.  ??  IMPRESSION: Large volume of ascites.     Assessment:   ?? Diarrhea with abdominal pain - Patient endorses multiple episodes of liquid diarrhea. Per patient, multiple episodes of recurrent C.diff over past 6 months. Lab unable to run C.Diff specimen due to stool being too formed.   ?? Ascites - previous work-up done at [facility] with reported normal liver biopsy.     Patient Active Problem List   Diagnosis Code   ??? Gastroenteritis K52.9     Plan:   ?? Continue empiric coverage for C.diff, if patient begins to have liquid stool again resend C.diff   ?? Consult Infectious Disease  ?? Hepatology consulted for ascites    Thank you for the consult.      Signed By: Aundria Rud, NP Student  Signed By: Myrtie Soman, NP     10/14/2015  2:01 PM       Addendum:  Pt independently seen and examined.  He reports that he wanted a "2nd opinion" and drove himself to the [facility] ED after being diagnosed with recurrent C dif at outside hospital.  Await C dif.  Await ID and hepatology input.  No records to review from prior workup at multiple  hospitals.  He asked questions about fecal transplant.  I informed him about the Complicated C dif Clinic at [facility].

## 2015-10-14 NOTE — H&P (Signed)
H&P dictated [id number]

## 2015-10-14 NOTE — Consults (Addendum)
[facility] ([facility]         Nephrology and Hypertension         Patient seen and examined. Full consult dictated    ASSESSMENT:  ESRD (MWF; [name])  HTN  Hyperkalemia  Acidosis  Anemia of ESRD  Recurrent C-diff  Ascites    PLAN:  HD today; orders written. [facility] notified  Repeat HD tomm per schedule  Renal diet  Ct Renvela  Add EPO  Rx of C-diff per primary  D/C IVF    D/W pt/RN  Thanks for Renal consult. We will follow patient with you.    Signed by:  Shavonne Ambroise M. Neno Hohensee, MD  Nephrology and HTN

## 2015-10-14 NOTE — H&P (Signed)
[place] [facility]   [address]   [place], [place] [id number]   HISTORY AND PHYSICAL       Name:  David [name], David [name]   MR#:  [id number]   DOB:  1971/01/20   Account #:  [id number]        Date of Adm:  10/13/2015       PRIMARY CARE PHYSICIAN: None.    SOURCE OF INFORMATION: The patient.    CHIEF COMPLAINT: Diarrhea.    HISTORY OF PRESENT ILLNESS: This is a 45 year old man with a   past medical history significant for end-stage renal disease on   hemodialysis, hypertension, recurrent C difficile, asthma, was in his   usual state of health until a couple of days ago when the patient   developed diarrhea. According to the patient, he has had C difficile   associated diarrhea and off for the past 6 months. He has been treated   with a combination of vancomycin and Flagyl with temporary relief and   then the C difficile will come back. The patient has been told that he   may require a fecal transplant because of the recurrent nature of the C   difficile associated diarrhea. The diarrhea is also associated with   persistent vomiting as well as abdominal pain. The abdominal pain is   diffuse in location, it is 10/10 in severity with radiation to the back. The   pain is a constant, sharp pain. The patient has stated that he has a   temperature of 102 at home, and he took some Tylenol, but the patient   did not have a temperature in the emergency room. Because of the   diarrhea, the patient needs his routine dialysis on Monday. The patient   initially scheduled for routine dialysis on Monday, Wednesday and   Friday.     The patient also has abdominal ascites. According to him, he   underwent a liver biopsy to determine the etiology of the ascites. He   was told that he has no significant liver pathology. The etiology of the   abdominal ascites remains unknown. He has had many paracenteses   done in the past. The patient has also had two episodes of    spontaneous bacterial peritonitis in the past. When the patient arrived   at the emergency room, he was found to have significant abdominal   distention. Diagnostic abdominal paracentesis was attempted by the   emergency room physician. The patient was subsequently referred to   the hospitalist service for evaluation for admission. He is having close   to eight episodes of diarrhea per day as well as eight episodes of   vomiting. He was seen at the [facility]   two days ago for the diarrhea. According to the patient, the stool sample   obtained at this hospital was positive for C difficile. The patient was   admitted for treatment, but he was not satisfied with the treatment he   was receiving at this hospital. Because of that, the patient signed out   against medical advice. He was thought that the diarrhea would get   better once he gets home. Because of worsening symptoms, he came   back to the emergency room at [facility]. The patient is   vising the [place] from [place]. [place] neph[place] is in   [place], [place].    PAST MEDICAL HISTORY: End-stage renal disease on hemodialysis,   hypertension, asthma,  recurrent C difficile.     ALLERGIES    1. PATIENT IS ALLERGIC TO LISINOPRIL.   2. TORADOL.   3. MORPHINE.       MEDICATIONS    1. Albuterol 90 mcg two puffs by inhalation every 4 hours as needed   for wheezing.   2. Norvasc 10 mg daily.   3. Coreg 12.5 mg twice daily.   4. Lasix 80 mg daily.   5. Hydralazine 100 mg three times daily.   6. Protonix 40 mg daily.   7. Renagel 400 mg three times daily.     FAMILY HISTORY: This was reviewed. No family history of chronic   kidney disease.    PAST SURGICAL HISTORY: This is significant for vascular access   placement for hemodialysis.    SOCIAL HISTORY: The patient smokes about half a pack of cigarettes   daily. He denies alcohol abuse.    REVIEW OF SYSTEMS   HEAD, EYES, EARS, NOSE AND THROAT: No headache, no    dizziness, no blurring of vision, no photophobia.   RESPIRATORY SYSTEM: This is positive for shortness of breath. No   cough. No hemoptysis.   CARDIOVASCULAR: No chest pain, no orthopnea, no palpitation.   GASTROINTESTINAL SYSTEM: This is positive for abdominal pain,   diarrhea, nausea and vomiting.   GENITOURINARY: No dysuria, no urgency, and no frequency. All   other systems are reviewed and they are negative.    PHYSICAL EXAMINATION   GENERAL APPEARANCE: The patient appeared ill, in moderate   distress.   VITAL SIGNS: On arrival at the emergency room, temperature 98.8,   pulse 104, respiratory rate 18, blood pressure 180/97, oxygen   saturation 99% on room air.   HEAD: Normocephalic, atraumatic.   EYES: Normal eye movements. No redness, no drainage, no   discharge.   EARS: Normal external ears with no obvious drainage.   NOSE: No deformity and no drainage.   MOUTH AND THROAT: No visible oral lesion. Dry oral mucosa.   NECK: Supple. No JVD. No thyromegaly.   CHEST: Clear breath sounds. No wheezing, no crackles.   HEART: Normal S1 and S2, regular. No clinically appreciable murmur.   ABDOMEN: Soft, distended, positive fluid thrill. Normal bowel sounds.   Diffuse tenderness, no rebound tenderness, no guarding.   CENTRAL NERVOUS SYSTEM: Alert, oriented x3. No gross focal   neurological deficits.   EXTREMITIES: Edema 2+. Pulses 2+ bilaterally.   MUSCULOSKELETAL: No obvious joint deformity or swelling. SKIN:   Areas of hyperpigmentation involving the lower abdomen noted.   PSYCHIATRY: Normal mood and affect.   LYMPHATIC: No cervical lymphadenopathy.     DIAGNOSTIC DATA: CT scan of the abdomen as well as abdominal   ultrasound shows large volume abdominal ascites.    CHEST X-RAY: No acute pathology.    LABORATORY DATA: Hematology - WBC 6.4, hemoglobin 8.4,   hematocrit 27.7, platelets 169.  Lipase level 465. Lactic acid level 0.9.   Coagulation profile: INR 1.1, PT 11.2, PTT 32.0. Chemistry: Sodium    138, potassium 5.0, chloride 105, CO2 19, glucose 95, BUN 92,   creatinine 12.50, calcium 7.0, bilirubin total 0.3, ALT 18, AST 24,   alkaline phosphatase 234, total protein 6.0, albumin level 2.6, globulin   at 3.4.    ASSESSMENT    1. Acute gastroenteritis.   2. End-stage renal disease on hemodialysis.   3. Hypertension.   4. Abdominal ascites.   5. Asthma.   6. Leg swelling.   7. Anemia  secondary to end-stage renal disease.   8. Tobacco abuse.       PLAN    1. Acute gastroenteritis. We will admit the patient for further evaluation   and treatment. We will carry out conservative treatment. Certainly,   there is a concern for recurrent Clostridium difficile as a possible cause   of diarrhea in this patient. We will carry out stool studies including stool   for Clostridium difficile. Gastroenterology consult will be requested to   assist in evaluation and treatment of acute gastroenteritis. If the repeat   stool study is positive for Clostridium difficile, the patient may require   fecal transplantation, but we will await recommendation from the   Gastroenterologist. Because the patient recently had stool that is   positive for Clostridium difficile antigen, will go ahead and start the   patient on oral vancomycin and intravenous Flagyl while awaiting   further recommendation from the Gastroenterologist.   2. End-stage renal disease. Nephrology consult will be requested for   continuation of hemodialysis during the hospitalization.   3. Hypertension. We will resume preadmission medication. We will   monitor the patient's blood pressure closely.   4. Abdominal ascites. There is a concern for spontaneous bacterial   peritonitis, but in the setting of recurrent Clostridium difficile associated   diarrhea, one has to be sure that the diagnosis of spontaneous   bacterial peritonitis is confirmed before exposure to antibiotics   that might make the Clostridium difficile associated diarrhea worse. We    will await fluid analysis. Only if spontaneous bacterial peritonitis is   confirmed, then will start antibiotics.   5. Asthma. We will resume preadmission medication.   6. Leg swelling. We will check ultrasound of the lower extremities for   deep venous thrombosis.   7. Anemia. This is most likely secondary to end-stage renal disease.   We will monitor the patient's hemoglobin and hematocrit closely.   8. Tobacco abuse. The patient advised to quit smoking. We will place   the patient on Nicoderm patch.     OTHER ISSUES: CODE STATUS: THE PATIENT IS A FULL CODE.     We will place the patient on heparin for deep venous thrombosis   prophylaxis.         Sander NephewAZAAK Dawnetta Copenhaver, MD      RE / CD   D:  10/14/2015   01:54   T:  10/14/2015   03:56   Job #:  [id number]

## 2015-10-14 NOTE — Consults (Addendum)
[place], MD, FACP, [facility]   April Reed Pandy, NP    David Queen, PA-C    David Isaacs, NP        at [facility]     [address] [place]  [address]     [phone]     FAX: [phone]    at [facility]     [address] [place]  [address]     [phone]     FAX: [phone]       HEPATOLOGY CONSULT NOTE  I was asked to see this patient in consultation by Dr Dairl Ponder for management of cirrhosis and ascites.  I have reviewed the Emergency room note, Hospital admission note, Notes by all other physicians who have seen the patient during this hospitalization to date.  I have reviewed the problem list and the reason for this hospitalization.  I have reviewed the allergies and the medications the patient was taking at home prior to this hospitalization.    HISTORY:  The patient is a 45 year old Black male with ESRD on hemodialysis.  He developed ESRD secondary to HTN and started dialysis several years ago.  He developed ascites for the first time soon thereafter.  He tells me that he was evaluated for liver disease and cirrhosis at [facility].  He underwent a liver biopsy there and was told he did not have cirrhosis.  He told me he had tests on his heart and was told he did not have heart disease.  He is currently undergoing paracentesis every week.    He also has chronic C Diff.  He has been repeated treated with ABX.  Diarrhea improves and then returns a few weeks after ABX are stopped.      ASSESSMENT AND PLAN:  Ascites  Currently unclear if this is secondary to cirrhosis or another cause.  The serum albumin is low and the AST/ALT are in a pattern consistent with cirrhosis.  The imaging does not suggest cirrhosis.  The SAAG is 1.1 which is equivical for cirrhosis or not.  Although he underwent a liver biopsy  at [facility] several years ago and this is reported not to show cirrhosis the specimen is not currently available for me to review, we do not know if this was fragmented and misinterpretted and we do not know if hepatic venous pressure measurements were done and if they were done correctly.  I think the best and most expediently way to proceed is to proceed with hepatic venous pressure measurements and a transjugular liver biopsy.  Will schedule this to be done Thursday.  Will get ECHO to make sure there is no pulmonary hypertension or RV dysfunction.    Chronic liver disease  Will perform serologic studies to evaluate for evidence of chronic liver disease.      SYSTEM REVIEW:  Constitution systems: Negative for fever, chills, weight gain, weight loss.   Eyes: Negative for visual changes.  ENT: Negative for sore throat, painful swallowing.   Respiratory: Negative for cough, hemoptysis, SOB.   Cardiology: Negative for chest pain, palpitations.  GI:  Negative for constipation or diarrhea.  GU: Negative for urinary frequency, dysuria, hematuria, nocturia.   Skin: Negative for rash.  Hematology: Negative for easy bruising, blood clots.    Musculo-skelatal: Negative for back pain,  muscle pain, weakness.  Neurologic: Negative for headaches, dizziness, vertigo, memory problems not related to HE.  Psychology: Negative for anxiety, depression.       FAMILY HISTORY:  The father has the following chronic diseases: HTN.    The mother has the following chronic diseases: HTN.    There is no family history of liver, renal or heart disease.      SOCIAL HISTORY:  The patientis divorced.    The patient has 2 children,   The patient currently smokes half pack of tobacco daily.    The patient has never consumed significant amounts of alcohol.    The patient used to work for the [employer]    The patient retired when he developed ESRD and started dialysis.    PHYSICAL EXAMINATION:  VS: per nursing note   General:  No acute distress.   Eyes:  Sclera anicteric.   ENT:  No oral lesions.  Thyroid normal.  Nodes:  No adenopathy.   Skin:  No spider angiomata.  No jaundice.  Respiratory:  Lungs clear to auscultation.   Cardiovascular:  Regular heart rate.  Abdomen:  Soft non-tender, Some ascites appears to be present.  Extremities:  No lower extremity edema.  No muscle wasting.  Neurologic:  Alert and oriented.  Cranial nerves grossly intact.  No asterixis.    LABORATORY:  Results for David, [name] (MRN [id number]) as of 10/14/2015 18:24   Ref. Range 10/13/2015 21:38 10/14/2015 05:55   WBC Latest Ref Range: 4.1 - 11.1 K/uL 6.4 5.5   HGB Latest Ref Range: 12.1 - 17.0 g/dL 8.4 (L) 8.1 (L)   PLATELET Latest Ref Range: 150 - 400 K/uL 169 167   INR Latest Ref Range: 0.9 - 1.1   1.1    Sodium Latest Ref Range: 136 - 145 mmol/L 138 139   Potassium Latest Ref Range: 3.5 - 5.1 mmol/L 5.0 5.7 (H)   Chloride Latest Ref Range: 97 - 108 mmol/L 105 109 (H)   CO2 Latest Ref Range: 21 - 32 mmol/L 19 (L) 18 (L)   Glucose Latest Ref Range: 65 - 100 mg/dL 95 92   BUN Latest Ref Range: 6 - 20 MG/DL 92 (H) 62 (H)   Creatinine Latest Ref Range: 0.70 - 1.30 MG/DL 12.50 (H) 12.90 (H)   Bilirubin, total Latest Ref Range: 0.2 - 1.0 MG/DL 0.3 0.3   Albumin Latest Ref Range: 3.5 - 5.0 g/dL 2.6 (L) 2.5 (L)   ALT (SGPT) Latest Ref Range: 12 - 78 U/L 18 18   AST Latest Ref Range: 15 - 37 U/L 24 24   Alk. phosphatase Latest Ref Range: 45 - 117 U/L 234 (H) 227 (H)   Lipase Latest Ref Range: 73 - 393 U/L 465 (H)    Lactic acid Latest Ref Range: 0.4 - 2.0 MMOL/L       Results for David, [name] (MRN [id number]) as of 10/14/2015 18:36   Ref. Range 10/14/2015 00:22   BODY FLUID TYPE Latest Units:   ASCITIC FLUID   FLUID APPEARANCE Latest Units:   TURBID   FLUID COLOR Latest Units:   PINK   FLUID RBC COUNT Latest Units: /cu mm >100   FLD NUCLEATED CELLS Latest Ref Range: 0 - 5 /cu mm 4   Fluid Type: Latest Units:   ASCITIC FLUID    Protein total, body fld. Latest Units: g/dL 2.6   Fluid Type: Latest Units:   ASCITIC FLUID   Albumin, body fld. Latest Units: g/dL 1.4  Fluid Type: Latest Units:   ASCITIC FLUID   Glucose, body fld. Latest Units: MG/DL 98       RADIOLOGY:  CT scan abdomen without IV contrast.  Normal appearing liver.  No liver mass lesions.  Normal spleen.  Large amount of ascites.    Thedore Mins, MD  [facility], [place]  [address]  [phone]

## 2015-10-14 NOTE — Progress Notes (Signed)
Primary Nurse Prentice Dockeronna Blankenship, RN and Theressa Millarderi Dix, RN performed a dual skin assessment on this patient No impairment noted  Braden score is 23

## 2015-10-14 NOTE — Progress Notes (Signed)
HD RN accessed pts av graft for HD earlier  It was successful but it was hurting pt and he asked the needles to be removed  I spoke to him over the phone and counseled and encouraged to allow use of AVG  Pt refused. He [facility] who would be able to intervene on Thursday  In meantime he needs HD  Discussed need for quinton and HD. Pt reported "I will be fine tonight" and does not want catheter tonight  Knows the risks of death with high k and missing HD  Will get quinton tomm, followed by HD    PLAN:  Consult IR for quinton catheter placement  HD in AM after quinton  If he is long enough in hospital thru Thursday, will consult [facility] for Angiogram  Would not give Kayexalate even with high K, due to c-diff/vomiting, as increase risk of ischemic colitis  Am labs    Luisa HartBhavesh M Tayshaun Kroh, MD  [facility]

## 2015-10-14 NOTE — Consults (Signed)
[facility] [place] [facility]   [address]   [place], [place] [id number]   CONSULTATION       Name:  David LimboMORGAN, Ruth   MR#:  [id number]   DOB:  Nov 11, 1970   Account #:  [id number]    Date of Consultation:  10/14/2015   Date of Adm:  10/13/2015       REQUESTING PHYSICIAN: Sander Nephewazaak Eniola, MD    REASON FOR CONSULTATION:  Evaluation and management of   ESRD.    HISTORY OF PRESENT ILLNESS: The patient is a 45 year old African   American male with past medical history significant for ESRD on HD,   recurrent C. difficile, hypertension and asthma, who was [place]   s[place]. He got sick with diarrhea, abdominal pain and   fever. He tried to hold off on coming to the hospital, but got sicker, and   then he decided to come to the hospital. Because of his recurrent C.   difficile, he is also interested in fecal transplant, and [facility]   when he learned that [facility].    He is on dialysis in [name] with [facility] unit, and followed by   a nephrology [place]. He ran HD for duration of 2-1/2 hours   last Friday.  ?missed HD on Monday    He runs normally 3-1/2 hours. Reports that his left AV access has been   having some difficulty recently. Labs were notable for creatinine of 12.9   and potassium of 5.7.    Denies nausea, vomiting or chest pain. No dyspnea. No leg edema.    PAST MEDICAL HISTORY: As noted above.    ALLERGIES: HE IS ALLERGIC TO    1. LISINOPRIL.   2. TORADOL.   3. MORPHINE.       HOME MEDICATIONS INCLUDE    1. Albuterol p.r.n.    2. Norvasc 10 mg daily.   3. Coreg 12.5 mg b.i.d.    4. Lasix 80 mg daily.   5. Hydralazine 100 mg t.i.d.   6. Protonix 40 mg daily.   7. Renagel 400 mg t.i.d.     FAMILY HISTORY: Negative for any CKD or ESRD.    SOCIAL HISTORY: He smokes half a pack of cigarettes daily. No   alcohol abuse.    REVIEW OF SYSTEMS: As noted in HPI. The remainder of the review   of systems obtained and negative.     DIAGNOSTIC DATA: CT of the abdomen shows large volume ascites.   Chest x-ray with no acute pathology. CBC with anemia, with a   hemoglobin of 8.1. Chemistry with bicarbonate of 18, potassium of 5.7,   creatinine of 12.9, phosphorous of 6.7. LFTs are normal, except   elevated alkaline phosphatase of 227. Lipase was elevated at 465.   Lactic acid was 0.9. Stool culture has been ordered. He had also ascitic   fluid drainage, which was greater than 100 RBCs, nucleated cells are   4. INR of 1.1.    ASSESSMENT    1. End-stage renal disease.   2. Hyperkalemia.   3. Acidosis.   4. Hypertension, which is uncontrolled.   5. Recurrent Clostridium difficile.   6. Ascites, with recent tap.       RECOMMENDATIONS    1. Will do hemodialysis today, 3-1/2 hours, using the left AV access,   with 2K of Dialysate.   2. If there is any difficulty with his  left access, he will need Quinton   catheter placement.   3. Renal diet.   4. Will add Epogen for treatment of anemia of CKD, but will hold off   until his blood pressure is better.   5. Continue Renvela.   6. D/C IVF, since his blood pressure is high and he is able to take   orally.   7. Treatment of C. difficile as per primary team.     Thanks for renal consultation. Will follow the patient with you.        Pennie BanterBHAVESH Denny Lave, MD      BP / DKH   D:  10/14/2015   11:16   T:  10/14/2015   11:44   Job #:  [id number]

## 2015-10-14 NOTE — Other (Signed)
TRANSFER - OUT REPORT:    Verbal report given to [facility]name) on David Limboroy [name]  being transferred to 6E(unit) for routine progression of care       Report consisted of patient???s Situation, Background, Assessment and   Recommendations(SBAR).     Information from the following report(s) SBAR, Kardex, ED Summary and MAR was reviewed with the receiving nurse.    Lines:   Peripheral IV 10/13/15 Right Antecubital (Active)   Site Assessment Clean, dry, & intact 10/13/2015 11:07 PM   Phlebitis Assessment 0 10/13/2015 11:07 PM   Infiltration Assessment 0 10/13/2015 11:07 PM   Dressing Status Clean, dry, & intact 10/13/2015 11:07 PM   Dressing Type Tape;Transparent 10/13/2015 11:07 PM   Hub Color/Line Status Pink;Patent 10/13/2015 11:07 PM        Opportunity for questions and clarification was provided.      Patient transported with:   [employer]

## 2015-10-14 NOTE — Progress Notes (Signed)
Bedside shift change report [facility] to [facility] (Cabin crewoncoming nurse) by Clydie BraunKaren (offgoing nurse). Report included the following information SBAR, Kardex, MAR and Recent Results.     Pt refused heparin.

## 2015-10-14 NOTE — Progress Notes (Signed)
Hospitalist Progress Note  Lynden AngIlona E Gael Delude, MD  Office: [phone]        Date of Service:  10/14/2015  NAME:  David [name]  DOB:  09-Oct-1970  MRN:  [id number]      Admission Summary:   This is a 45 year old man with a past medical history significant for end-stage renal disease on   hemodialysis, hypertension, recurrent C difficile, and asthma, who was in his   usual state of health until a week prior to admission, when he started having increased abdominal pain, distension and diarrhea.  Patient has a history of recurrent C. Diff.  [facility]  Per patient, the test for C. Diff was positive and he was started on treatment.  However, he did not like the service, so left the hospital and came [facility].     The patient also complains of recurrent abdominal ascites since January this year.  He reports he had a liver biopsy in the past and he was told he has not liver disease.      Patient reports two episodes of spontaneous bacterial peritonitis in the past.       Interval history / Subjective:   Complains of abdominal pain 10/10 and requesting the dose of Dilaudid to be increased to "every 2-3 h".      Assessment & Plan:     Diarrhea:  - seems resolved, patient had a formed BM earlier today.   - sample not accepted to be tested for C. Diff;  - will attempt to get records/[facility]charge [facility].   - empirically on PO Vancomycin and IV Flagyl.    ESRD:  - HD dependent;   - Nephrology consulted: last dialysis on Friday, missed dialysis yesterday;    HTN:  - uncontrolled;  - patient states that his BP drops with Dialysis, so we will monitor for now;   - resume home BP medications when able;     Recurrent Ascites:  - Hepatology consulted;  - patient reports history of SBP;  - paracentesis on admission: no evidence of SBP;    Asthma:   - stable, no SOB;    Anemia:  - most likely anemia of chronic disease.    - management per Nephrology.     Nicotine addiction:  - patient was counseled on admission;   - declined Nicotine patch;       Code status: Full   DVT prophylaxis: Heparin SC    Care Plan discussed with: Patient/Family and Nurse  Disposition: Home w/Family and TBD     Hospital Problems  Date Reviewed: 10/14/2015          Codes Class Noted POA    * (Principal)Gastroenteritis ICD-10-CM: K52.9  ICD-9-CM: 558.9  10/14/2015 Yes                Review of Systems:   Pertinent items are noted in HPI.       Vital Signs:    Last 24hrs VS reviewed since prior progress note. Most recent are:  Visit Vitals   ??? BP (!) 181/105 (BP 1 Location: Right arm, BP Patient Position: At rest)   ??? Pulse 91   ??? Temp 97.3 ??F (36.3 ??C)   ??? Resp 16   ??? Ht 6\' 1"  (1.854 m)   ??? Wt 113.4 kg (250 lb)   ??? SpO2 99%   ??? BMI 32.98 kg/m2       No intake or output data in  the 24 hours ending 10/14/15 1326     Physical Examination:             Constitutional:  No acute distress, cooperative, pleasant??   ENT:  Oral mucous moist, oropharynx benign. Neck supple;,    Resp:  CTA bilaterally. No wheezing/rhonchi/rales. No accessory muscle use   CV:  Regular rhythm, normal rate, no murmurs, gallops, rubs    GI:  Soft, slightly distended, diffusely tender. normoactive bowel sounds;  Paracentesis site RLQ, no drainage or bleeding;     Musculoskeletal:  2+ edema, tense.    Neurologic:  Moves all extremities.  AAOx3, speech clear, memory intact;     Skin:  Good turgor, no rashes or ulcers       Data Review:    Review and/or order of clinical lab test  Review and/or order of tests in the radiology section of CPT  Review and/or order of tests in the medicine section of CPT      Labs:     Recent Labs      10/14/15   0555  10/13/15   2138   WBC  5.5  6.4   HGB  8.1*  8.4*   HCT  26.5*  27.7*   PLT  167  169     Recent Labs      10/14/15   0555  10/13/15   2138   NA  139  138   K  5.7*  5.0   CL  109*  105   CO2  18*  19*   BUN  62*  92*   CREA  12.90*  12.50*   GLU  92  95    CA  6.9*  7.0*   MG  2.3   --    PHOS  6.7*   --      Recent Labs      10/14/15   0555  10/13/15   2138   SGOT  24  24   ALT  18  18   AP  227*  234*   TBILI  0.3  0.3   TP  5.9*  6.0*   ALB  2.5*  2.6*   GLOB  3.4  3.4   LPSE   --   465*     Recent Labs      10/13/15   2138   INR  1.1   PTP  11.2*   APTT  32.0      No results for input(s): FE, TIBC, PSAT, FERR in the last 72 hours.   No results found for: FOL, RBCF   No results for input(s): PH, PCO2, PO2 in the last 72 hours.  No results for input(s): CPK, CKNDX, TROIQ in the last 72 hours.    No lab exists for component: CPKMB  No results found for: CHOL, CHOLX, CHLST, CHOLV, HDL, LDL, LDLC, DLDLP, TGLX, TRIGL, TRIGP, CHHD, CHHDX  No results found for: GLUCPOC  No results found for: COLOR, APPRN, SPGRU, REFSG, PHU, PROTU, GLUCU, KETU, BILU, UROU, NITU, LEUKU, GLUKE, EPSU, BACTU, WBCU, RBCU, CASTS, UCRY      Medications Reviewed:     Current Facility-Administered Medications   Medication Dose Route Frequency   ??? albuterol (PROVENTIL VENTOLIN) nebulizer solution 2.5 mg  2.5 mg Nebulization Q4H PRN   ??? amLODIPine (NORVASC) tablet 10 mg  10 mg Oral DAILY   ??? carvedilol (COREG) tablet 12.5 mg  12.5 mg Oral BID WITH MEALS   ??? hydrALAZINE (  APRESOLINE) tablet 100 mg  100 mg Oral TID   ??? pantoprazole (PROTONIX) tablet 40 mg  40 mg Oral DAILY   ??? sevelamer carbonate (RENVELA) tab 800 mg  800 mg Oral TID WITH MEALS   ??? acetaminophen (TYLENOL) tablet 650 mg  650 mg Oral Q4H PRN   ??? HYDROmorphone (PF) (DILAUDID) injection 1 mg  1 mg IntraVENous Q4H PRN   ??? ondansetron (ZOFRAN) injection 4 mg  4 mg IntraVENous Q4H PRN   ??? heparin (porcine) injection 5,000 Units  5,000 Units SubCUTAneous Q8H   ??? metroNIDAZOLE (FLAGYL) IVPB premix 500 mg  500 mg IntraVENous Q8H   ??? lactobac ac& pc-s.therm-b.anim (FLORA Q/RISAQUAD)  1 Cap Oral DAILY   ??? vancomycin 50 mg/mL oral solution (compounded) 250 mg  250 mg Oral Q6H   ??? nicotine (NICODERM CQ) 21 mg/24 hr patch 1 Patch  1 Patch TransDERmal  Q24H   ??? 0.9% sodium chloride infusion  100 mL/hr IntraVENous DIALYSIS PRN   ??? albumin human 25% (BUMINATE) solution 12.5 g  12.5 g IntraVENous DIALYSIS PRN   ??? [START ON 10/15/2015] epoetin alfa (EPOGEN;PROCRIT) injection 10,000 Units  10,000 Units SubCUTAneous Q MON, WED & FRI     ______________________________________________________________________  EXPECTED LENGTH OF STAY: 3d 19h  ACTUAL LENGTH OF STAY:          0                 Lynden AngIlona E Vennesa Bastedo, MD

## 2015-10-14 NOTE — Progress Notes (Addendum)
TRANSFER - IN REPORT:    Verbal report received from Victoria(name) on David [name]  being received from ED(unit) for routine progression of care      Report consisted of patient???s Situation, Background, Assessment and   Recommendations(SBAR).     Information from the following report(s) SBAR, ED Summary and Recent Results was reviewed with the receiving nurse.    Opportunity for questions and clarification was provided.      Assessment completed upon patient???s arrival to unit and care assumed.

## 2015-10-14 NOTE — Consults (Signed)
Infectious Disease Consult    Today's Date: 10/14/2015   Admit Date: 10/13/2015    Impression:   ?? Recurrent bouts of C diff since December--not sure that is what he has now  ?? ESRD  ?? Ascites of uncertain origin    Plan:   ?? Continue current therapy  ?? ? Value of flex sig or other endoscopy  ?? ? therapeutic paracentesis     Anti-infectives:     Inpat Anti-Infectives     Start     Ordered Stop    10/14/15 0600  vancomycin 50 mg/mL oral solution (compounded) 250 mg  250 mg,   Oral,   EVERY 6 HOURS      10/14/15 0336 --    10/14/15 0400  metroNIDAZOLE (FLAGYL) IVPB premix 500 mg  500 mg,   IntraVENous,   EVERY 8 HOURS      10/14/15 0336 --          Subjective:   Date of Consultation:  October 14, 2015  Referring Physician: Dr Sherlynn Carbon    Patient is a 45 y.o. male admitted with abdominal pain and diarrhea. He states that he has had recurrent episodes of C diff since being treated for pneumonia this past December. He states that he has about one episode a month, just after finishing a course of therapy. He has been on Flagyl, vancomycin and Dificid. He is admitted with ascites (a chronic problem) and loose bowels. He has not had a diarrheal stool in the hospital.     Patient Active Problem List   Diagnosis Code   ??? Gastroenteritis K52.9     Past Medical History:   Diagnosis Date   ??? Ascites    ??? Asthma    ??? C. difficile colitis    ??? CKD (chronic kidney disease) stage V requiring chronic dialysis (HCC)    ??? HTN (hypertension)    ??? SBP (spontaneous bacterial peritonitis) (HCC)       History reviewed. No pertinent family history.   Social History   Substance Use Topics   ??? Smoking status: Heavy Tobacco Smoker     Packs/day: 0.50   ??? Smokeless tobacco: Never Used   ??? Alcohol use No     History reviewed. No pertinent surgical history.   Prior to Admission medications    Medication Sig Start Date End Date Taking? Authorizing Provider   sevelamer (RENAGEL) 400 mg tablet Take 800 mg by mouth three (3) times daily (with meals).   Yes  Phys Other, MD   carvedilol (COREG) 12.5 mg tablet Take  by mouth two (2) times daily (with meals).   Yes Phys Other, MD   hydrALAZINE (APRESOLINE) 100 mg tablet Take 100 mg by mouth three (3) times daily.   Yes Phys Other, MD   amLODIPine (NORVASC) 10 mg tablet Take 10 mg by mouth daily.   Yes Phys Other, MD   pantoprazole (PROTONIX) 40 mg tablet Take 40 mg by mouth daily.   Yes Phys Other, MD   furosemide (LASIX) 80 mg tablet Take 80 mg by mouth daily.   Yes Phys Other, MD   albuterol (PROVENTIL HFA, VENTOLIN HFA, PROAIR HFA) 90 mcg/actuation inhaler Take 2 Puffs by inhalation every four (4) hours as needed for Wheezing.   Yes Phys Other, MD       Allergies   Allergen Reactions   ??? Lisinopril Shortness of Breath   ??? Morphine Shortness of Breath   ??? Toradol [Ketorolac] Shortness of Breath  Review of Systems:  Pertinent items are noted in the History of Present Illness.    Objective:     Visit Vitals   ??? BP 156/85 (BP 1 Location: Right arm, BP Patient Position: At rest)   ??? Pulse 86   ??? Temp 98.9 ??F (37.2 ??C)   ??? Resp 16   ??? Ht 6\' 1"  (1.854 m)   ??? Wt 113.4 kg (250 lb)   ??? SpO2 99%   ??? BMI 32.98 kg/m2     Temp (24hrs), Avg:98.2 ??F (36.8 ??C), Min:97.3 ??F (36.3 ??C), Max:98.9 ??F (37.2 ??C)       Lines:  Peripheral IV:            Physical Exam:  General:  alert, cooperative, no distress, appears stated age  Lungs:  clear to auscultation bilaterally  Heart:  regular rate and rhythm  Abdomen:  abnormal findings:  distended, tenderness mild in the entire abdomen  Skin:  no rash or abnormalities    Data Review:     CBC:  Recent Labs      10/14/15   0555  10/13/15   2138   WBC  5.5  6.4   GRANS  66  70   MONOS  9  7   EOS  11*  10*   ANEU  3.7  4.5   ABL  0.7*  0.8   HGB  8.1*  8.4*   HCT  26.5*  27.7*   PLT  167  169       BMP:  Recent Labs      10/14/15   0555  10/13/15   2138   CREA  12.90*  12.50*   BUN  62*  92*   NA  139  138   K  5.7*  5.0   CL  109*  105   CO2  18*  19*   AGAP  12  14   GLU  92  95        LFTS:  Recent Labs      10/14/15   0555  10/13/15   2138   TBILI  0.3  0.3   ALT  18  18   SGOT  24  24   AP  227*  234*   TP  5.9*  6.0*   ALB  2.5*  2.6*       Microbiology:     All Micro Results     Procedure Component Value Units Date/Time    CULTURE, STOOL [[id number]] Collected:  10/14/15 1053    Order Status:  Completed Specimen:  Stool Updated:  10/14/15 1618     Special Requests: NO SPECIAL REQUESTS        Campylobacter antigen NEGATIVE        Culture result: PENDING    C. DIFFICILE (DNA) [[id number]] Collected:  10/14/15 1548    Order Status:  Completed Updated:  10/14/15 1605    C. DIFFICILE (DNA) [[id number]] Collected:  10/14/15 1053    Order Status:  Canceled Updated:  10/14/15 1108    OVA & PARASITES, STOOL [[id number]] Collected:  10/14/15 1053    Order Status:  Completed Specimen:  Stool from Stool Updated:  10/14/15 1108    MRSA CULTURE [[id number]] Collected:  10/14/15 0408    Order Status:  Completed Specimen:  Nares Updated:  10/14/15 0631    CULTURE, BLOOD, PAIRED [[id number]][[id number]] Collected:  10/13/15 2230    Order Status:  Completed Specimen:  Blood Updated:  10/14/15 0628     Special Requests: NO SPECIAL REQUESTS        Culture result: NO GROWTH AFTER 5 HOURS       CULTURE, BODY FLUID Gay FillerW GRAM STAIN [[id number]][[id number]] Collected:  10/14/15 0022    Order Status:  Completed Specimen:  Paracentesis Fluid Updated:  10/14/15 0600     Special Requests: NO SPECIAL REQUESTS        GRAM STAIN OCCASIONAL  WBCS SEEN         NO ORGANISMS SEEN        Culture result: PENDING    C. DIFFICILE (DNA) [[id number]] Collected:  10/14/15 0030    Order Status:  Canceled     C. DIFFICILE (DNA) [[id number]]     Order Status:  Canceled     CULTURE, BLOOD [[id number]] Collected:  10/13/15 2250    Order Status:  Canceled Updated:  10/13/15 2334          Imaging:   Results noted    Signed By: Bradly Chrisavid Drinda Belgard, MD     October 14, 2015

## 2015-10-14 NOTE — Consults (Signed)
[place], MD, FACP, [facility]   April Reed Pandy, NP    Arlyss Queen, PA-C    Demetrios Isaacs, NP        at [facility]     [address] [place]  [address]     [phone]     FAX: [phone]    at [facility]     [address], [place]  [address]     [phone]     FAX: [phone]       HEPATOLOGY CONSULT NOTE  I was asked to see this patient in consultation by Dr Dairl Ponder for management of cirrhosis and ascites.  I have reviewed the Emergency room note, Hospital admission note, Notes by all other physicians who have seen the patient during this hospitalization to date.  I have reviewed the problem list and the reason for this hospitalization.  I have reviewed the allergies and the medications the patient was taking at home prior to this hospitalization.    HISTORY:  The patient is a 45 year old Black male with ESRD on hemodialysis.  He developed ESRD secondary to HTN and started dialysis several years ago.  He developed ascites for the first time soon thereafter.  He tells me that he was evaluated for liver disease and cirrhosis at [facility].  He underwent a liver biopsy there and was told he did not have cirrhosis.  He told me he had tests on his heart and was told he did not have heart disease.  He is currently undergoing paracentesis every week.    He also has chronic C Diff.  He has been repeated treated with ABX.  Diarrhea improves and then returns a few weeks after ABX are stopped.      ASSESSMENT AND PLAN:  Ascites  Currently unclear if this is secondary to cirrhosis or another cause.  The serum albumin is low and the AST/ALT are in a pattern consistent with cirrhosis.  The imaging does not suggest cirrhosis.  The SAAG is 1.1 which is equivical for cirrhosis or not.  Although he underwent a liver biopsy at [facility] several years ago and this is  reported not to show cirrhosis the specimen is not currently available for me to review, we do not know if this was fragmented and misinterpretted and we do not know if hepatic venous pressure measurements were done and if they were done correctly.  I think the best and most expediently way to proceed is to proceed with hepatic venous pressure measurements and a transjugular liver biopsy.  Will schedule this to be done Thursday.  Will get ECHO to make sure there is no pulmonary hypertension or RV dysfunction.    Chronic liver disease  Will perform serologic studies to evaluate for evidence of chronic liver disease.      SYSTEM REVIEW:  Constitution systems: Negative for fever, chills, weight gain, weight loss.   Eyes: Negative for visual changes.  ENT: Negative for sore throat, painful swallowing.   Respiratory: Negative for cough, hemoptysis, SOB.   Cardiology: Negative for chest pain, palpitations.  GI:  Negative for constipation or diarrhea.  GU: Negative for urinary frequency, dysuria, hematuria, nocturia.   Skin: Negative for rash.  Hematology: Negative for easy bruising, blood clots.    Musculo-skelatal: Negative for back pain,  muscle pain, weakness.  Neurologic: Negative for headaches, dizziness, vertigo, memory problems not related to HE.  Psychology: Negative for anxiety, depression.       FAMILY HISTORY:  The father has the following chronic diseases: HTN.    The mother has the following chronic diseases: HTN.    There is no family history of liver, renal or heart disease.      SOCIAL HISTORY:  The patientis divorced.    The patient has 2 children,   The patient currently smokes half pack of tobacco daily.    The patient has never consumed significant amounts of alcohol.    The patient used to work for the [employer]    The patient retired when he developed ESRD and started dialysis.    PHYSICAL EXAMINATION:  VS: per nursing note  General:  No acute distress.   Eyes:  Sclera anicteric.   ENT:  No oral  lesions.  Thyroid normal.  Nodes:  No adenopathy.   Skin:  No spider angiomata.  No jaundice.  Respiratory:  Lungs clear to auscultation.   Cardiovascular:  Regular heart rate.  Abdomen:  Soft non-tender, Some ascites appears to be present.  Extremities:  No lower extremity edema.  No muscle wasting.  Neurologic:  Alert and oriented.  Cranial nerves grossly intact.  No asterixis.    LABORATORY:  Results for JERRI, HARGADON (MRN [id number]) as of 10/14/2015 18:24   Ref. Range 10/13/2015 21:38 10/14/2015 05:55   WBC Latest Ref Range: 4.1 - 11.1 K/uL 6.4 5.5   HGB Latest Ref Range: 12.1 - 17.0 g/dL 8.4 (L) 8.1 (L)   PLATELET Latest Ref Range: 150 - 400 K/uL 169 167   INR Latest Ref Range: 0.9 - 1.1   1.1    Sodium Latest Ref Range: 136 - 145 mmol/L 138 139   Potassium Latest Ref Range: 3.5 - 5.1 mmol/L 5.0 5.7 (H)   Chloride Latest Ref Range: 97 - 108 mmol/L 105 109 (H)   CO2 Latest Ref Range: 21 - 32 mmol/L 19 (L) 18 (L)   Glucose Latest Ref Range: 65 - 100 mg/dL 95 92   BUN Latest Ref Range: 6 - 20 MG/DL 92 (H) 62 (H)   Creatinine Latest Ref Range: 0.70 - 1.30 MG/DL 12.50 (H) 12.90 (H)   Bilirubin, total Latest Ref Range: 0.2 - 1.0 MG/DL 0.3 0.3   Albumin Latest Ref Range: 3.5 - 5.0 g/dL 2.6 (L) 2.5 (L)   ALT (SGPT) Latest Ref Range: 12 - 78 U/L 18 18   AST Latest Ref Range: 15 - 37 U/L 24 24   Alk. phosphatase Latest Ref Range: 45 - 117 U/L 234 (H) 227 (H)   Lipase Latest Ref Range: 73 - 393 U/L 465 (H)    Lactic acid Latest Ref Range: 0.4 - 2.0 MMOL/L       Results for VIRGIE, CHERY (MRN [id number]) as of 10/14/2015 18:36   Ref. Range 10/14/2015 00:22   BODY FLUID TYPE Latest Units:   ASCITIC FLUID   FLUID APPEARANCE Latest Units:   TURBID   FLUID COLOR Latest Units:   PINK   FLUID RBC COUNT Latest Units: /cu mm >100   FLD NUCLEATED CELLS Latest Ref Range: 0 - 5 /cu mm 4   Fluid Type: Latest Units:   ASCITIC FLUID   Protein total, body fld. Latest Units: g/dL 2.6   Fluid Type: Latest Units:   ASCITIC FLUID   Albumin, body  fld. Latest Units: g/dL 1.4  Fluid Type: Latest Units:   ASCITIC FLUID   Glucose, body fld. Latest Units: MG/DL 98       RADIOLOGY:  CT scan abdomen without IV contrast.  Normal appearing liver.  No liver mass lesions.  Normal spleen.  Large amount of ascites.    Thedore Mins, MD  [facility] [address], [place]  [address]  [phone]

## 2015-10-14 NOTE — Consults (Signed)
Consults by Donnel Saxon at 10/14/15 1401                Author: Donnel Saxon  Service: Gastroenterology  Author Type: Physician       Filed: 10/14/15 1641  Date of Service: 10/14/15 1401  Status: Addendum          Editor: Donnel Saxon          Related Notes: Original Note by Myrtie Soman filed at 10/14/15 1417            Consult Orders        1. IP CONSULT TO GASTROENTEROLOGY [[id number]] ordered by Noralyn Pick, MD at 10/14/15 0216                                 [place]     [facility]    [address]   [place] [address]           GASTROENTEROLOGY CONSULTATION NOTE   Stann Ore ACNP   [phone]          NAME:  Keeyon Privitera     DOB:   Apr 04, 1971    MRN:   [id number]           Referring Physician: Noralyn Pick, MD      Consult Date: 10/14/2015  2:01 PM      Chief Complaint: abdominal pain and diarrhea       History of Present Illness:  Patient is a  45 y.o. gentlemen who is seen in consultation at the request of Dr. Lum Babe for abdominal pain and diarrhea.  Mr. Delduca is complaining of abdominal pain and frequent diarrhea. He endorses having liquid  bowel movements 6-8 x day. Recently went to an outside hospital where he was told he had C.diff but signed out AMA. He states that since December 2016 he has been suffering from recurrent episodes of C.diff. He does not have a Solicitor. When  he begins having symptoms of frequent diarrhea he usually presents to [facility] ED.  Also, has recently been suffering from ascites.  He does not currently see a hepatologist, but is undergoing weekly paracentesis also at [facility]           PMH:     Past Medical History:        Diagnosis  Date         ?  Ascites       ?  Asthma       ?  C. difficile colitis       ?  CKD (chronic kidney disease) stage V requiring chronic dialysis (HCC)       ?  HTN (hypertension)           ?  SBP (spontaneous bacterial peritonitis) (HCC)              PSH:   History reviewed. No pertinent surgical history.      Allergies:     Allergies        Allergen  Reactions         ?  Lisinopril  Shortness of Breath     ?  Morphine  Shortness of Breath         ?  Toradol [Ketorolac]  Shortness of Breath           Home Medications:     Prior  to Admission Medications     Prescriptions  Last Dose  Informant  Patient Reported?  Taking?      albuterol (PROVENTIL HFA, VENTOLIN HFA, PROAIR HFA) 90 mcg/actuation inhaler  10/13/2015 at Unknown time    Yes  Yes      Sig: Take 2 Puffs by inhalation every four (4) hours as needed for Wheezing.      amLODIPine (NORVASC) 10 mg tablet  10/13/2015 at Unknown time    Yes  Yes      Sig: Take 10 mg by mouth daily.      carvedilol (COREG) 12.5 mg tablet  10/13/2015 at Unknown time    Yes  Yes      Sig: Take  by mouth two (2) times daily (with meals).      furosemide (LASIX) 80 mg tablet  10/13/2015 at Unknown time    Yes  Yes      Sig: Take 80 mg by mouth daily.      hydrALAZINE (APRESOLINE) 100 mg tablet  10/13/2015 at Unknown time    Yes  Yes      Sig: Take 100 mg by mouth three (3) times daily.      pantoprazole (PROTONIX) 40 mg tablet  10/13/2015 at Unknown time    Yes  Yes      Sig: Take 40 mg by mouth daily.      sevelamer (RENAGEL) 400 mg tablet  10/13/2015 at Unknown time    Yes  Yes      Sig: Take 800 mg by mouth three (3) times daily (with meals).               Facility-Administered Medications: None           Hospital Medications:     Current Facility-Administered Medications          Medication  Dose  Route  Frequency           ?  albuterol (PROVENTIL VENTOLIN) nebulizer solution 2.5 mg   2.5 mg  Nebulization  Q4H PRN     ?  amLODIPine (NORVASC) tablet 10 mg   10 mg  Oral  DAILY     ?  carvedilol (COREG) tablet 12.5 mg   12.5 mg  Oral  BID WITH MEALS     ?  hydrALAZINE (APRESOLINE) tablet 100 mg   100 mg  Oral  TID     ?  pantoprazole (PROTONIX) tablet 40 mg   40 mg  Oral  DAILY     ?  sevelamer carbonate (RENVELA) tab 800 mg   800 mg   Oral  TID WITH MEALS     ?  acetaminophen (TYLENOL) tablet 650 mg   650 mg  Oral  Q4H PRN           ?  HYDROmorphone (PF) (DILAUDID) injection 1 mg   1 mg  IntraVENous  Q4H PRN           ?  ondansetron (ZOFRAN) injection 4 mg   4 mg  IntraVENous  Q4H PRN     ?  heparin (porcine) injection 5,000 Units   5,000 Units  SubCUTAneous  Q8H     ?  metroNIDAZOLE (FLAGYL) IVPB premix 500 mg   500 mg  IntraVENous  Q8H     ?  lactobac ac& pc-s.therm-b.anim (FLORA Q/RISAQUAD)   1 Cap  Oral  DAILY     ?  vancomycin 50 mg/mL oral solution (compounded) 250 mg  250 mg  Oral  Q6H     ?  nicotine (NICODERM CQ) 21 mg/24 hr patch 1 Patch   1 Patch  TransDERmal  Q24H     ?  0.9% sodium chloride infusion   100 mL/hr  IntraVENous  DIALYSIS PRN     ?  albumin human 25% (BUMINATE) solution 12.5 g   12.5 g  IntraVENous  DIALYSIS PRN           ?  [START ON 10/15/2015] epoetin alfa (EPOGEN;PROCRIT) injection 10,000 Units   10,000 Units  SubCUTAneous  Q MON, WED & FRI           Social History:     Social History       Substance Use Topics         ?  Smoking status:  Heavy Tobacco Smoker              Packs/day:  0.50         ?  Smokeless tobacco:  Never Used         ?  Alcohol use  No           Family History:   History reviewed. No pertinent family history.      Review of Systems:   Constitutional: negative fever, negative chills, negative weight loss   Eyes:   negative visual changes   ENT:   negative sore throat, tongue or lip swelling   Respiratory:  negative cough, negative dyspnea   Cards:  negative for chest pain, palpitations, lower extremity edema   GI:   See HPI   GU:  negative for frequency, dysuria   Integument:  negative for rash and pruritus   Heme:  negative for easy bruising and gum/nose bleeding   Musculoskel: negative for myalgias,  back pain and muscle weakness   Neuro: negative for headaches, dizziness, vertigo   Psych:  negative for feelings of anxiety, depression         Objective:     Patient Vitals for the past 8 hrs:             BP  Temp  Pulse  Resp  SpO2     10/14/15 0841  (!) 181/105  97.3 ??F (36.3 ??C)  91  16  99 %                   EXAM: CONST-44 yo male lying in bed     NEURO-alert and oriented x 3    HEENT-PERLA, EOMI, no scleral icterus    LUNGS-clear to ausculation    CARD-S1 S2    ABD-firm, tender, distended, bowel sounds (+) all 4 quadrants, no masses    EXT-no edema, warm    PSYCH - full, not anxious       Data Review         Recent Labs             10/14/15    0555   10/13/15    2138     WBC   5.5   6.4     HGB   8.1*   8.4*     HCT   26.5*   27.7*         PLT   167   169          Recent Labs             10/14/15    0555   10/13/15  2138     NA   139   138     K   5.7*   5.0     CL   109*   105     CO2   18*   19*     BUN   62*   92*     CREA   12.90*   12.50*     GLU   92   95     PHOS   6.7*    --          CA   6.9*   7.0*          Recent Labs             10/14/15    0555   10/13/15    2138     SGOT   24   24     AP   227*   234*     TP   5.9*   6.0*     ALB   2.5*   2.6*     GLOB   3.4   3.4         LPSE    --    465*          Recent Labs            10/13/15    2138     INR   1.1     PTP   11.2*        APTT   32.0        CT ABD PELV WO CONT   FINDINGS:    There is a large amount of ascites or dialysate. There is generalized edema of   the body wall.   Kidneys are atrophic. No urinary tract stones are seen. There is no   hydroureteronephrosis.    Liver shows no apparent significant finding without contrast. Pancreas, adrenal   glands, spleen and aorta show no significant enlargement.  No focal inflammation   is seen. There is no free air or substantial adenopathy.   The bladder is empty. There is no apparent pelvic mass. The appendix is normal.   ??   IMPRESSION: Large amount of ascites or dialysate.      [place] ABD  LTD   FINDINGS: Sonography of the 4 quadrants of the abdomen show a large quantity of   simple appearing ascites, also demonstrated on today's CT.   ??   IMPRESSION: Large volume of ascites.         Assessment:      ??    Diarrhea with abdominal pain - Patient endorses multiple episodes of liquid diarrhea. Per patient, multiple episodes of recurrent  C.diff over past 6 months. Lab unable to run C.Diff specimen due to stool being too formed.    ??  Ascites - previous work-up done at [facility] with reported normal liver biopsy.          Patient Active Problem List        Diagnosis  Code         ?  Gastroenteritis  K52.9          Plan:     ??    Continue empiric coverage for C.diff, if patient begins to have liquid stool again resend C.diff    ??  Consult Infectious Disease   ??  Hepatology consulted for ascites      Thank you for the consult.  Signed By: Aundria Rudourtney Kendall, NP Student      Signed By:  Myrtie SomanElizabeth P Adams, NP        10/14/2015  2:01 PM           Addendum:   Pt independently seen and examined.  He reports that he wanted a "2nd opinion" and [facility] after being diagnosed with recurrent C dif at outside hospital.  Await C dif.  Await ID and hepatology input.  No records to review from prior  workup at multiple hospitals.  He asked questions about fecal transplant.  I informed him [facility]

## 2015-10-14 NOTE — Consults (Signed)
[facility] ([facility])         Nephrology and Hypertension         Patient seen and examined. Full consult dictated    ASSESSMENT:  ESRD (MWF; [name])  HTN  Hyperkalemia  Acidosis  Anemia of ESRD  Recurrent C-diff  Ascites    PLAN:  HD today; orders written. [facility] notified  Repeat HD tomm per schedule  Renal diet  Ct Renvela  Add EPO  Rx of C-diff per primary  D/C IVF    D/W pt/RN  Thanks for Renal consult. We will follow patient with you.    Signed by:  Luisa HartBhavesh M. Lirio Bach, MD  Nephrology and HTN

## 2015-10-14 NOTE — Consults (Signed)
[address] [facility] [facility]   [address]   [place], [place] [id number]   CONSULTATION       Name:  David LimboMORGAN, Chapin   MR#:  [id number]   DOB:  21-Nov-1970   Account #:  [id number]    Date of Consultation:  10/14/2015   Date of Adm:  10/13/2015       REQUESTING PHYSICIAN: Sander Nephewazaak Eniola, MD    REASON FOR CONSULTATION:  Evaluation and management of   ESRD.    HISTORY OF PRESENT ILLNESS: The patient is a 45 year old African   American male with past medical history significant for ESRD on HD,   recurrent C. difficile, hypertension and asthma, who was visiting his   sister in [place]. He got sick with diarrhea, abdominal pain and   fever. He tried to hold off on coming to the hospital, but got sicker, and   then he decided to come to the hospital. Because of his recurrent C.   difficile, he is also interested in fecal transplant, and [facility]   when he learned that fecal [facility] is [facility].    He is on dialysis in [name] with [facility] unit, and followed by   [place] neph[place] in [place]. He ran HD for duration of 2-1/2 hours   last Friday.  ?missed HD on Monday    He runs normally 3-1/2 hours. Reports that his left AV access has been   having some difficulty recently. Labs were notable for creatinine of 12.9   and potassium of 5.7.    Denies nausea, vomiting or chest pain. No dyspnea. No leg edema.    PAST MEDICAL HISTORY: As noted above.    ALLERGIES: HE IS ALLERGIC TO    1. LISINOPRIL.   2. TORADOL.   3. MORPHINE.       HOME MEDICATIONS INCLUDE    1. Albuterol p.r.n.    2. Norvasc 10 mg daily.   3. Coreg 12.5 mg b.i.d.    4. Lasix 80 mg daily.   5. Hydralazine 100 mg t.i.d.   6. Protonix 40 mg daily.   7. Renagel 400 mg t.i.d.     FAMILY HISTORY: Negative for any CKD or ESRD.    SOCIAL HISTORY: He smokes half a pack of cigarettes daily. No   alcohol abuse.    REVIEW OF SYSTEMS: As noted in HPI. The remainder of the review   of systems obtained and  negative.    DIAGNOSTIC DATA: CT of the abdomen shows large volume ascites.   Chest x-ray with no acute pathology. CBC with anemia, with a   hemoglobin of 8.1. Chemistry with bicarbonate of 18, potassium of 5.7,   creatinine of 12.9, phosphorous of 6.7. LFTs are normal, except   elevated alkaline phosphatase of 227. Lipase was elevated at 465.   Lactic acid was 0.9. Stool culture has been ordered. He had also ascitic   fluid drainage, which was greater than 100 RBCs, nucleated cells are   4. INR of 1.1.    ASSESSMENT    1. End-stage renal disease.   2. Hyperkalemia.   3. Acidosis.   4. Hypertension, which is uncontrolled.   5. Recurrent Clostridium difficile.   6. Ascites, with recent tap.       RECOMMENDATIONS    1. Will do hemodialysis today, 3-1/2 hours, using the left AV access,   with 2K of Dialysate.   2. If there is any difficulty with his  left access, he will need Quinton   catheter placement.   3. Renal diet.   4. Will add Epogen for treatment of anemia of CKD, but will hold off   until his blood pressure is better.   5. Continue Renvela.   6. D/C IVF, since his blood pressure is high and he is able to take   orally.   7. Treatment of C. difficile as per primary team.     Thanks for renal consultation. Will follow the patient with you.        Pennie BanterBHAVESH Boaz Berisha, MD      BP / DKH   D:  10/14/2015   11:16   T:  10/14/2015   11:44   Job #:  [id number]

## 2015-10-15 ENCOUNTER — Encounter: Admit: 2015-10-15 | Discharge: 2015-10-15 | Payer: MEDICARE | Attending: Nurse Practitioner

## 2015-10-15 ENCOUNTER — Inpatient Hospital Stay: Admit: 2015-10-15 | Payer: MEDICARE

## 2015-10-15 LAB — CBC WITH AUTOMATED DIFF
ABS. BASOPHILS: 0.1 10*3/uL (ref 0.0–0.1)
ABS. EOSINOPHILS: 0.7 10*3/uL — ABNORMAL HIGH (ref 0.0–0.4)
ABS. LYMPHOCYTES: 0.7 10*3/uL — ABNORMAL LOW (ref 0.8–3.5)
ABS. MONOCYTES: 0.4 10*3/uL (ref 0.0–1.0)
ABS. NEUTROPHILS: 4.3 10*3/uL (ref 1.8–8.0)
BASOPHILS: 1 % (ref 0–1)
EOSINOPHILS: 11 % — ABNORMAL HIGH (ref 0–7)
HCT: 26.8 % — ABNORMAL LOW (ref 36.6–50.3)
HGB: 8.3 g/dL — ABNORMAL LOW (ref 12.1–17.0)
LYMPHOCYTES: 12 % (ref 12–49)
MCH: 29.5 PG (ref 26.0–34.0)
MCHC: 31 g/dL (ref 30.0–36.5)
MCV: 95.4 FL (ref 80.0–99.0)
MONOCYTES: 7 % (ref 5–13)
NEUTROPHILS: 69 % (ref 32–75)
PLATELET: 172 10*3/uL (ref 150–400)
RBC: 2.81 M/uL — ABNORMAL LOW (ref 4.10–5.70)
RDW: 17.2 % — ABNORMAL HIGH (ref 11.5–14.5)
WBC: 6.2 10*3/uL (ref 4.1–11.1)

## 2015-10-15 LAB — RENAL FUNCTION PANEL
Albumin: 2.7 g/dL — ABNORMAL LOW (ref 3.5–5.0)
Anion gap: 14 mmol/L (ref 5–15)
BUN/Creatinine ratio: 5 — ABNORMAL LOW (ref 12–20)
BUN: 65 MG/DL — ABNORMAL HIGH (ref 6–20)
CO2: 15 mmol/L — CL (ref 21–32)
Calcium: 6.9 MG/DL — ABNORMAL LOW (ref 8.5–10.1)
Chloride: 105 mmol/L (ref 97–108)
Creatinine: 13.6 MG/DL — ABNORMAL HIGH (ref 0.70–1.30)
GFR est AA: 5 mL/min/{1.73_m2} — ABNORMAL LOW (ref >60)
GFR est non-AA: 4 mL/min/{1.73_m2} — ABNORMAL LOW (ref >60)
Glucose: 78 mg/dL (ref 65–100)
Phosphorus: 7.6 MG/DL — ABNORMAL HIGH (ref 2.6–4.7)
Potassium: 5.9 mmol/L — ABNORMAL HIGH (ref 3.5–5.1)
Sodium: 134 mmol/L — ABNORMAL LOW (ref 136–145)

## 2015-10-15 LAB — CULTURE, MRSA

## 2015-10-15 LAB — HEPATITIS C AB: Hep C virus Ab Interp.: NONREACTIVE

## 2015-10-15 LAB — HEP B SURFACE AG
Hep B surface Ag Interp.: NEGATIVE
Hepatitis B surface Ag: <0.1 Index

## 2015-10-15 LAB — IRON PROFILE
Iron % saturation: 29 % (ref 20–50)
Iron: 83 ug/dL (ref 35–150)
TIBC: 285 ug/dL (ref 250–450)

## 2015-10-15 LAB — HEP B SURFACE AB
Hep B surface Ab Interp.: REACTIVE
Hepatitis B surface Ab: 15.02 m[IU]/mL

## 2015-10-15 LAB — WBC, STOOL: White blood cells, stool: 0 /HPF (ref 0–4)

## 2015-10-15 LAB — FERRITIN: Ferritin: 1247 NG/ML — ABNORMAL HIGH (ref 26–388)

## 2015-10-15 LAB — MAGNESIUM: Magnesium: 2.4 mg/dL (ref 1.6–2.4)

## 2015-10-15 LAB — ECHOCARDIOGRAM COMPLETE 2D W DOPPLER W COLOR: Left Ventricular Ejection Fraction: 58

## 2015-10-15 LAB — HEPATITIS C ANTIBODY: Hepatitis C Ab: NONREACTIVE

## 2015-10-15 LAB — HEPATITIS B SURFACE ANTIBODY
Hep B S Ab: REACTIVE
Hepatitis B Surface Ag: 15.02 m[IU]/mL

## 2015-10-15 MED ORDER — IOHEXOL 240 MG/ML IV SOLN
240 mg iodine/mL | Freq: Once | INTRAVENOUS | Status: DC
Start: 2015-10-15 — End: 2015-10-15
  Administered 2015-10-15: 19:00:00 via INTRATHECAL

## 2015-10-15 MED ORDER — LIDOCAINE HCL 2 % (20 MG/ML) IJ SOLN
20 mg/mL (2 %) | Freq: Once | INTRAMUSCULAR | Status: AC
Start: 2015-10-15 — End: 2015-10-15
  Administered 2015-10-15: 18:00:00 via SUBCUTANEOUS

## 2015-10-15 MED ORDER — LIDOCAINE HCL 2 % (20 MG/ML) IJ SOLN
20 mg/mL (2 %) | INTRAMUSCULAR | Status: AC
Start: 2015-10-15 — End: 2015-10-16
  Administered 2015-10-15: 18:00:00

## 2015-10-15 MED ORDER — HYDROMORPHONE (PF) 1 MG/ML IJ SOLN
1 mg/mL | INTRAMUSCULAR | Status: DC | PRN
Start: 2015-10-15 — End: 2015-10-17
  Administered 2015-10-15 – 2015-10-17 (×19): via INTRAVENOUS

## 2015-10-15 MED ORDER — METRONIDAZOLE 250 MG TAB
250 mg | Freq: Three times a day (TID) | ORAL | Status: DC
Start: 2015-10-15 — End: 2015-10-17
  Administered 2015-10-15 – 2015-10-17 (×6): via ORAL

## 2015-10-15 MED ORDER — LEVOFLOXACIN 500 MG TAB
500 mg | ORAL | Status: DC
Start: 2015-10-15 — End: 2015-10-15
  Administered 2015-10-15: 14:00:00 via ORAL

## 2015-10-15 MED ORDER — IOHEXOL 240 MG/ML IV SOLN
240 mg iodine/mL | INTRAVENOUS | Status: AC
Start: 2015-10-15 — End: 2015-10-15
  Administered 2015-10-15: 19:00:00

## 2015-10-15 MED ORDER — HEPARIN (PORCINE) 1,000 UNIT/ML IJ SOLN
1000 unit/mL | INTRAMUSCULAR | Status: AC
Start: 2015-10-15 — End: 2015-10-16
  Administered 2015-10-15: 18:00:00

## 2015-10-15 MED ORDER — FENTANYL CITRATE (PF) 50 MCG/ML IJ SOLN
50 mcg/mL | INTRAMUSCULAR | Status: DC | PRN
Start: 2015-10-15 — End: 2015-10-15

## 2015-10-15 MED ORDER — HYDROMORPHONE (PF) 1 MG/ML IJ SOLN
1 mg/mL | Freq: Once | INTRAMUSCULAR | Status: AC
Start: 2015-10-15 — End: 2015-10-15
  Administered 2015-10-15: 19:00:00 via INTRAVENOUS

## 2015-10-15 MED ORDER — HEPARIN (PORCINE) 1,000 UNIT/ML IJ SOLN
1000 unit/mL | Freq: Once | INTRAMUSCULAR | Status: DC
Start: 2015-10-15 — End: 2015-10-15
  Administered 2015-10-15: 18:00:00 via INTRAVENOUS

## 2015-10-15 MED FILL — HEPARIN (PORCINE) 5,000 UNIT/ML IJ SOLN: 5000 unit/mL | INTRAMUSCULAR | Qty: 1

## 2015-10-15 MED FILL — RENVELA 800 MG TABLET: 800 mg | ORAL | Qty: 1

## 2015-10-15 MED FILL — VANCOMYCIN ORAL SOLUTION 50 MG/ML CPD (RX COMPOUNDED): 50 mg/mL | ORAL | Qty: 5

## 2015-10-15 MED FILL — METRONIDAZOLE 250 MG TAB: 250 mg | ORAL | Qty: 2

## 2015-10-15 MED FILL — HYDROMORPHONE (PF) 1 MG/ML IJ SOLN: 1 mg/mL | INTRAMUSCULAR | Qty: 1

## 2015-10-15 MED FILL — CARVEDILOL 12.5 MG TAB: 12.5 mg | ORAL | Qty: 1

## 2015-10-15 MED FILL — HEPARIN (PORCINE) 1,000 UNIT/ML IJ SOLN: 1000 unit/mL | INTRAMUSCULAR | Qty: 10

## 2015-10-15 MED FILL — OMNIPAQUE 240 MG IODINE/ML INTRAVENOUS SOLUTION: 240 mg iodine/mL | INTRAVENOUS | Qty: 50

## 2015-10-15 MED FILL — RISAQUAD 8 BILLION CELL CAPSULE: 8 billion cell | ORAL | Qty: 1

## 2015-10-15 MED FILL — HYDRALAZINE 50 MG TAB: 50 mg | ORAL | Qty: 2

## 2015-10-15 MED FILL — NICOTINE 21 MG/24 HR DAILY PATCH: 21 mg/24 hr | TRANSDERMAL | Qty: 1

## 2015-10-15 MED FILL — AMLODIPINE 5 MG TAB: 5 mg | ORAL | Qty: 2

## 2015-10-15 MED FILL — PROCRIT 10,000 UNIT/ML INJECTION SOLUTION: 10000 unit/mL | INTRAMUSCULAR | Qty: 1

## 2015-10-15 MED FILL — LIDOCAINE HCL 2 % (20 MG/ML) IJ SOLN: 20 mg/mL (2 %) | INTRAMUSCULAR | Qty: 20

## 2015-10-15 MED FILL — METRONIDAZOLE IN SODIUM CHLORIDE (ISO-OSM) 500 MG/100 ML IV PIGGY BACK: 500 mg/100 mL | INTRAVENOUS | Qty: 100

## 2015-10-15 MED FILL — LEVOFLOXACIN 500 MG TAB: 500 mg | ORAL | Qty: 1

## 2015-10-15 NOTE — Progress Notes (Signed)
Bedside shift change report given to Kailynn,RN (oncoming nurse) by Ceci,RN (offgoing nurse). Report included the following information SBAR and Kardex.

## 2015-10-15 NOTE — Procedures (Signed)
PROCEDURE:Quinton catheter placement via right femoral vein.  INDICATION:dialysis.  ANESTHESIA:sedation and local.  COMPLICATION:NONE.  SPECIMENS REMOVED:none.  BLOOD LOSS:NONE.  OPERATOR/ASSISTANT:Evah Rashid, M.D.  RECOMMENDATIONS:may use.  CONSENT OBTAINED:YES.  NOTES:bilaterally occluded IJs and stents in right subclavian vein.

## 2015-10-15 NOTE — Progress Notes (Signed)
ID Progress Note  10/15/2015    Subjective:     Dialysis catheter placed in left groin today    Objective:     Antibiotics:  1. Flagyl      Vitals:   Visit Vitals   ??? BP (!) 164/101 (BP 1 Location: Right arm, BP Patient Position: At rest)   ??? Pulse 92   ??? Temp 98.1 ??F (36.7 ??C)   ??? Resp 18   ??? Ht 6\' 1"  (1.854 m)   ??? Wt 114.9 kg (253 lb 6.4 oz)   ??? SpO2 100%   ??? BMI 33.43 kg/m2        Tmax:  Temp (24hrs), Avg:98.1 ??F (36.7 ??C), Min:97.8 ??F (36.6 ??C), Max:98.9 ??F (37.2 ??C)      Exam:  Lungs:  clear to auscultation bilaterally  Heart:  regular rate and rhythm  Abdomen:  abnormal findings:  distended    Labs:      Recent Labs      10/15/15   0403  10/14/15   0555  10/13/15   2138   WBC  6.2  5.5  6.4   HGB  8.3*  8.1*  8.4*   PLT  172  167  169   BUN  65*  62*  92*   CREA  13.60*  12.90*  12.50*   SGOT   --   24  24   AP   --   227*  234*   TBILI   --   0.3  0.3       Cultures:     No results found for: SDES  Lab Results   Component Value Date/Time    Culture result:  10/14/2015 10:53 AM     NO ROUTINE ENTERIC PATHOGENS ISOLATED INCLUDING SALMONELLA, SHIGELLA, YERSINIA, VIBRIO OR SHIGA TOXIN PRODUCING E. COLI   SO FAR      Culture result: MRSA NOT PRESENT 10/14/2015 04:08 AM    Culture result:  10/14/2015 04:08 AM         Screening of patient nares for MRSA is for surveillance purposes and, if positive, to facilitate isolation considerations in high risk settings. It is not intended for automatic decolonization interventions per se as regimens are not sufficiently effective to warrant routine use.       Radiology:     Line/Insert Date:           Assessment:     1. C diff--follow up testing negative  2. Ascites  3. ESRD    Objective:     1. Continue current therapy    Bradly Chrisavid Maninder Deboer, MD

## 2015-10-15 NOTE — Progress Notes (Addendum)
[place]   [facility]  [address]  [place], [place] [id number]       GI PROGRESS NOTE  Stann Orelizabeth Adams ACNP  [phone]: David [name]   DOB:  [birth year]   MRN:  [id number]       Subjective:     He is frustrated with his c-diff treatments.  Again had a long discussion with him about his previous treatments.  He has never taken a vanc taper and this is often requried before a fecal transplant will be considered.  C-diff toxin was negative yesterday though he has been on flagyl and vanc since Saturday.    Objective:     VITALS:   Last 24hrs VS reviewed since prior progress note. Most recent are:  Visit Vitals   ??? BP (!) 180/110 (BP 1 Location: Right arm, BP Patient Position: Supine)   ??? Pulse 90   ??? Temp 97.8 ??F (36.6 ??C)   ??? Resp 16   ??? Ht 6\' 1"  (1.854 m)   ??? Wt 114.9 kg (253 lb 6.4 oz)   ??? SpO2 100%   ??? BMI 33.43 kg/m2       PHYSICAL EXAM:  General: Cooperative, no acute distress????  Neurologic:?? Alert and oriented X 3.  HEENT: PERRLA, EOMI.   Lungs:  CTA Bilaterally. No Wheezing  Heart:  s1 s2, Regular  rhythm,?? No murmur   Abdomen: Soft, Non distended, Non tender. ??+Bowel sounds  Extremities: No edema  Psych:???? frustrated    Lab Data Reviewed:     Recent Results (from the past 24 hour(s))   CULTURE, STOOL    Collection Time: 10/14/15 10:53 AM   Result Value Ref Range    Special Requests: NO SPECIAL REQUESTS      Campylobacter antigen NEGATIVE      Culture result: PENDING    C. DIFFICILE (DNA)    Collection Time: 10/14/15  3:48 PM   Result Value Ref Range    C. difficile (DNA) NEGATIVE  NEG     RENAL FUNCTION PANEL    Collection Time: 10/15/15  4:03 AM   Result Value Ref Range    Sodium 134 (L) 136 - 145 mmol/L    Potassium 5.9 (H) 3.5 - 5.1 mmol/L    Chloride 105 97 - 108 mmol/L    CO2 15 (LL) 21 - 32 mmol/L    Anion gap 14 5 - 15 mmol/L    Glucose 78 65 - 100 mg/dL    BUN 65 (H) 6 - 20 MG/DL    Creatinine 24.4013.60 (H) 0.70 - 1.30 MG/DL    BUN/Creatinine ratio 5 (L) 12 - 20       GFR est AA 5 (L) >60 ml/min/1.3673m2    GFR est non-AA 4 (L) >60 ml/min/1.5873m2    Calcium 6.9 (L) 8.5 - 10.1 MG/DL    Phosphorus 7.6 (H) 2.6 - 4.7 MG/DL    Albumin 2.7 (L) 3.5 - 5.0 g/dL   MAGNESIUM    Collection Time: 10/15/15  4:03 AM   Result Value Ref Range    Magnesium 2.4 1.6 - 2.4 mg/dL   CBC WITH AUTOMATED DIFF    Collection Time: 10/15/15  4:03 AM   Result Value Ref Range    WBC 6.2 4.1 - 11.1 K/uL    RBC 2.81 (L) 4.10 - 5.70 M/uL    HGB 8.3 (L) 12.1 - 17.0 g/dL    HCT 10.226.8 (L) 72.536.6 - 50.3 %    MCV 95.4 80.0 -  99.0 FL    MCH 29.5 26.0 - 34.0 PG    MCHC 31.0 30.0 - 36.5 g/dL    RDW 16.117.2 (H) 09.611.5 - 14.5 %    PLATELET 172 150 - 400 K/uL    NEUTROPHILS 69 32 - 75 %    LYMPHOCYTES 12 12 - 49 %    MONOCYTES 7 5 - 13 %    EOSINOPHILS 11 (H) 0 - 7 %    BASOPHILS 1 0 - 1 %    ABS. NEUTROPHILS 4.3 1.8 - 8.0 K/UL    ABS. LYMPHOCYTES 0.7 (L) 0.8 - 3.5 K/UL    ABS. MONOCYTES 0.4 0.0 - 1.0 K/UL    ABS. EOSINOPHILS 0.7 (H) 0.0 - 0.4 K/UL    ABS. BASOPHILS 0.1 0.0 - 0.1 K/UL   FERRITIN    Collection Time: 10/15/15  4:03 AM   Result Value Ref Range    Ferritin 1247 (H) 26 - 388 NG/ML   IRON PROFILE    Collection Time: 10/15/15  4:03 AM   Result Value Ref Range    Iron 83 35 - 150 ug/dL    TIBC 045285 409250 - 811450 ug/dL    Iron % saturation 29 20 - 50 %         ________________________________________________________________________       Assessment:   ?? 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  He has never been given a vanc taper.  DC levaquin.  Would recommend a vanc taper to exhaust all treatment options.  He is aware that in order to have a fecal transplant [facility]  [place] [facility] also and he can look into this as another option.     Patient Active Problem List   Diagnosis Code   ??? Gastroenteritis K52.9     Plan:   ?? DC Levaquin and avoid all other abx if possible  ?? Vanc taper - ask Dr Roxan Hockeyowles to help with this.  Concern that c-diff toxin  may have been negative yesterday as he had begun treatment over the [facility] [place] [facility].  We can do a flex sig in the future if needed.  ?? Florastor BID  ?? All questions answered     Signed By: Myrtie SomanElizabeth P Adams, NP     10/15/2015  10:49 AM         Addendum:  Agree with the above assessment and plan.  Stools are more formed today. C dif negative.  ID is following re: recurrent C dif - would follow their recommendations.  Agree with probiotic S boulaardi as adjuvant rx.  No records from multiple outside hospitals.  Depending on his history that is not available to review, he may consider Complicated C [facility] with Dr. Bess Harvestachel Hayes.

## 2015-10-15 NOTE — Progress Notes (Signed)
Bedside shift change report given to Simona Huheci RN (oncoming nurse) by Evangeline GulaKaren RN (offgoing nurse). Report included the following information SBAR, Recent Results and Med Rec Status.

## 2015-10-15 NOTE — H&P (Signed)
Radiology History and Physical    Patient: David [name] 45 y.o. male     Chief Complaint:   Chief Complaint   Patient presents with   ??? Abdominal Pain       History of Present Illness: Dialysis access.    History:    Past Medical History:   Diagnosis Date   ??? Ascites    ??? Asthma    ??? C. difficile colitis    ??? CKD (chronic kidney disease) stage V requiring chronic dialysis (HCC)    ??? HTN (hypertension)    ??? SBP (spontaneous bacterial peritonitis) (HCC)      History reviewed. No pertinent family history.  Social History     Social History   ??? Marital status: DIVORCED     Spouse name: N/A   ??? Number of children: N/A   ??? Years of education: N/A     Occupational History   ??? Not on file.     Social History Main Topics   ??? Smoking status: Heavy Tobacco Smoker     Packs/day: 0.50   ??? Smokeless tobacco: Never Used   ??? Alcohol use No   ??? Drug use: No   ??? Sexual activity: Not on file     Other Topics Concern   ??? Not on file     Social History Narrative       Allergies:   Allergies   Allergen Reactions   ??? Lisinopril Shortness of Breath   ??? Morphine Shortness of Breath   ??? Toradol [Ketorolac] Shortness of Breath       Current Medications:  Current Facility-Administered Medications   Medication Dose Route Frequency   ??? metoprolol (LOPRESSOR) injection 2 mg  2 mg IntraVENous NOW   ??? HYDROmorphone (PF) (DILAUDID) injection 1 mg  1 mg IntraVENous Q3H PRN   ??? metroNIDAZOLE (FLAGYL) tablet 500 mg  500 mg Oral TID   ??? heparin (porcine) 1,000 unit/mL injection 3,200 Units  3,200 Units IntraVENous PRN   ??? albuterol (PROVENTIL VENTOLIN) nebulizer solution 2.5 mg  2.5 mg Nebulization Q4H PRN   ??? amLODIPine (NORVASC) tablet 10 mg  10 mg Oral DAILY   ??? carvedilol (COREG) tablet 12.5 mg  12.5 mg Oral BID WITH MEALS   ??? hydrALAZINE (APRESOLINE) tablet 100 mg  100 mg Oral TID   ??? sevelamer carbonate (RENVELA) tab 800 mg  800 mg Oral TID WITH MEALS   ??? acetaminophen (TYLENOL) tablet 650 mg  650 mg Oral Q4H PRN    ??? ondansetron (ZOFRAN) injection 4 mg  4 mg IntraVENous Q4H PRN   ??? heparin (porcine) injection 5,000 Units  5,000 Units SubCUTAneous Q8H   ??? lactobac ac& pc-s.therm-b.anim (FLORA Q/RISAQUAD)  1 Cap Oral DAILY   ??? nicotine (NICODERM CQ) 21 mg/24 hr patch 1 Patch  1 Patch TransDERmal Q24H   ??? 0.9% sodium chloride infusion  100 mL/hr IntraVENous DIALYSIS PRN   ??? albumin human 25% (BUMINATE) solution 12.5 g  12.5 g IntraVENous DIALYSIS PRN   ??? epoetin alfa (EPOGEN;PROCRIT) injection 10,000 Units  10,000 Units SubCUTAneous Q MON, WED & FRI     Facility-Administered Medications Ordered in Other Encounters   Medication Dose Route Frequency   ??? metoprolol (LOPRESSOR) injection    PRN   ??? propofol (DIPRIVAN) 10 mg/mL injection   IntraVENous PRN   ??? fentaNYL citrate (PF) injection    PRN   ??? 0.9% sodium chloride infusion   IntraVENous CONTINUOUS   ??? HYDROmorphone (PF) (DILAUDID)  injection    PRN        Physical Exam:  Blood pressure (!) 151/98, pulse 96, temperature 97.9 ??F (36.6 ??C), resp. rate 18, height 6\' 1"  (1.854 m), weight 114.5 kg (252 lb 6.4 oz), SpO2 100 %.  GENERAL: alert, cooperative, mild distress, appears stated age, LUNG: clear to auscultation bilaterally, HEART: regular rate and rhythm      Alerts:    Hospital Problems  Date Reviewed: 10/16/2015          Codes Class Noted POA    * (Principal)Gastroenteritis ICD-10-CM: K52.9  ICD-9-CM: 558.9  10/14/2015 Yes              Laboratory:      Recent Labs      10/15/15   0403   10/13/15   2138   HGB  8.3*   < >  8.4*   HCT  26.8*   < >  27.7*   WBC  6.2   < >  6.4   PLT  172   < >  169   INR   --    --   1.1   BUN  65*   < >  92*   CREA  13.60*   < >  12.50*   K  5.9*   < >  5.0    < > = values in this interval not displayed.         Plan of Care/Planned Procedure:  Risks, benefits, and alternatives reviewed with patient and he agrees to proceed with the procedure.

## 2015-10-15 NOTE — Progress Notes (Signed)
Name: David [name]   MRN: [id number]  DOB: 03-Jul-1970      Assessment:  ESRD (MWF; [facility] [name]); reduce HD on Friday last week and missed HD on Monday  HTN  Hyperkalemia: K upto 5.9.   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    Pt did not get HD last night due to trouble with his access. It was cannulated fine and had good QB per HD RN, but pt had pain and requested needles to be removed. Pt upset about documentation by HD RN>>"It makes me look like I refused HD" and asking to change documentation. Explained the need to document appropriately and cannot be changed. He did ask me to hold HD last night, when I spoke with phone. He is awaiting quinton this am; offered medical Rx for high K (RN Present)>>don't want kayexalate; don't want D50 + Insulin (my sugar goes low). "I will be fine". Did tell him, I will have to document this that you do not want medical Rx for high K and he was OK with that. Spoke with IR to expedite quinton. [facility] aware about need for HD asap after quinton.    Plan/Recommendations:  Quinton by IR today  Repeat HD tomm and then again on Friday per his schedule  HD after quinton  Pt does not want medical Rx for high K for now  Renal diet  Ct Renvela  Ct EPO  Rx of C-diff per primary    Subjective:  Upset about documentation in chart by HD RN;     ROS:   No nausea, no vomiting  No chest pain, +shortness of breath    Exam:  Visit Vitals   ??? BP (!) 180/110 (BP 1 Location: Right arm, BP Patient Position: Supine)   ??? Pulse 90   ??? Temp 97.8 ??F (36.6 ??C)   ??? Resp 16   ??? Ht 6\' 1"  (1.854 m)   ??? Wt 114.9 kg (253 lb 6.4 oz)   ??? SpO2 100%   ??? BMI 33.43 kg/m2       WB/WN in NAD  Pallor+, no icterus  Dec BS with few basilar rales+  RRR, no rub  Soft, distended, ascites+, NT  +edema  AVG patent  AOx3. No myolconus     Current Facility-Administered Medications   Medication Dose Route Frequency Last Dose   ??? HYDROmorphone (PF) (DILAUDID) injection 1 mg  1 mg IntraVENous Q3H PRN 1 mg at 10/15/15 0730   ??? metroNIDAZOLE (FLAGYL) tablet 500 mg  500 mg Oral TID 500 mg at 10/15/15 [id number]   ??? levoFLOXacin (LEVAQUIN) tablet 500 mg  500 mg Oral Q48H 500 mg at 10/15/15 [id number]   ??? albuterol (PROVENTIL VENTOLIN) nebulizer solution 2.5 mg  2.5 mg Nebulization Q4H PRN     ??? amLODIPine (NORVASC) tablet 10 mg  10 mg Oral DAILY Stopped at 10/14/15 0900   ??? carvedilol (COREG) tablet 12.5 mg  12.5 mg Oral BID WITH MEALS 12.5 mg at 10/15/15 0730   ??? hydrALAZINE (APRESOLINE) tablet 100 mg  100 mg Oral TID Stopped at 10/15/15 0900   ??? sevelamer carbonate (RENVELA) tab 800 mg  800 mg Oral TID WITH MEALS 800 mg at 10/15/15 0730   ??? acetaminophen (TYLENOL) tablet 650 mg  650 mg Oral Q4H PRN     ??? ondansetron (ZOFRAN) injection 4 mg  4 mg IntraVENous Q4H PRN 4 mg at 10/14/15 1543   ??? heparin (porcine) injection 5,000 Units  5,000 Units SubCUTAneous Q8H     ??? lactobac ac& pc-s.therm-b.anim (FLORA Q/RISAQUAD)  1 Cap Oral DAILY 1 Cap at 10/15/15 0952   ??? nicotine (NICODERM CQ) 21 mg/24 hr patch 1 Patch  1 Patch TransDERmal Q24H     ??? 0.9% sodium chloride infusion  100 mL/hr IntraVENous DIALYSIS PRN     ??? albumin human 25% (BUMINATE) solution 12.5 g  12.5 g IntraVENous DIALYSIS PRN     ??? epoetin alfa (EPOGEN;PROCRIT) injection 10,000 Units  10,000 Units SubCUTAneous Q MON, WED & FRI         Labs/Data:    Lab Results   Component Value Date/Time    WBC 6.2 10/15/2015 04:03 AM    HGB 8.3 10/15/2015 04:03 AM    HCT 26.8 10/15/2015 04:03 AM    PLATELET 172 10/15/2015 04:03 AM    MCV 95.4 10/15/2015 04:03 AM       Lab Results   Component Value Date/Time    Sodium 134 10/15/2015 04:03 AM    Potassium 5.9 10/15/2015 04:03 AM    Chloride 105 10/15/2015 04:03 AM    CO2 15 10/15/2015 04:03 AM    Anion gap 14 10/15/2015 04:03 AM    Glucose 78 10/15/2015 04:03 AM     BUN 65 10/15/2015 04:03 AM    Creatinine 13.60 10/15/2015 04:03 AM    BUN/Creatinine ratio 5 10/15/2015 04:03 AM    GFR est AA 5 10/15/2015 04:03 AM    GFR est non-AA 4 10/15/2015 04:03 AM    Calcium 6.9 10/15/2015 04:03 AM       Wt Readings from Last 3 Encounters:   10/15/15 114.9 kg (253 lb 6.4 oz)       No intake or output data in the 24 hours ending 10/15/15 1027    Patient seen and examined. Chart reviewed. Labs, data and other pertinent notes reviewed in last 24 hrs.    PMH/SH/FH reviewed and unchanged compared to H&P    Discussed with pt/RN, IR and [facility]      Luisa HartBhavesh M Zaidy Absher, MD

## 2015-10-15 NOTE — Consults (Signed)
Vascular Surgery  --asked to see re: pain in left arm AV fistula  --patient is concerned about central venous stenosis  --will add on for left arm fistulagram tomorrow

## 2015-10-15 NOTE — Other (Signed)
[facility]                    [place]                         [phone]  Vitals Pre Post Assessment Pre Post   BP 155/91 166/105 LOC Alert,oriented x3. Alert,oriented x3.   HR 93 103 Lungs Slight sob per pt. Diminished.   Temp 98 98 Cardiac rrr  tachycardia.   Resp 16 18 Skin Warm,dry. Warm,dry.   Weight 116kg 114.6kg Edema 1+  both legs. Trace both legs.      Pain Medicated by floor rn. Medicated by floor rn.     Orders   Duration: Start: 1835 End: 2135 Total: 3 hours.   Dialyzer: revaclear.   K Bath: 2   Ca Bath: 2.5   Na / Bicarb: 140/40.   Target Fluid Removal: 2500 ml( including nsf).     Access   Type & Location: Right femoral quinton.   Comments:   Femoral catheter inserted today by IR. Medical aseptic prep both ports pre-hd. Femoral catheter functioning fairly well. Lines reversed for better flow.                              Labs   Hep B status / date: sAB > 150  06/20/15.   Obtained/Reviewed  Critical Results Called Reviewed.  n/a     Meds Given   Name Dose Route   None.                 Total Liters Process: 60   Net Fluid Removed: 1481      Comments   Time Out Done: Yes.   Primary Nurse Rpt Pre: Vivien Rotaecilia Faison RN   Primary Nurse Rpt Post: Charlann BoxerKailynn Lumpkin RN   Pt Education: Importance of completing his dialysis treatment as ordered.   Care Plan:    Tx Summary: Tolerated dialysis fairly. Around 2100, patient wanted to come off dialysis. Refused to complete hd treatment. Stated he is not feeling good. Complained of abdominal pain. Assigned floor rn notified by patient. Dr. Deeann SaintMcneer notified. Informed her of patients high serum k and his next hd schedule. After talking to md, patient changed his mind and decided to complete hd time. Around 2130, he wanted to come off dialysis. Aware of his high k. Dialysis terminated with 30 minutes left. All possible blood returned. Left patient in nad.

## 2015-10-15 NOTE — Progress Notes (Signed)
[place], MD, FACP, [facility]   David Nuon Reed Pandy, NP    David Queen, PA-C    David Isaacs, NP        at [name]     [address] [place]  [address]     [phone]     FAX: [phone]    at [facility]     [address] [place]  [address]     [phone]     FAX: [phone]       HEPATOLOGY PROGRESS NOTE  I have reviewed the events of the past 24 hours including all physician notes, vital signs, medications the patient has received, laboratory studies, radiology exams.    The patient is a 45 y.o. Black male with ESRD on hemodialysis. He developed ESRD secondary to HTN and started dialysis several years ago.  He developed ascites for the first time soon thereafter. He was evaluated for liver disease and cirrhosis at [facility].  He underwent a liver biopsy there and was told he did not have cirrhosis. He also reports having tests on his heart and was told he did not have heart disease.     An ECHO is scheduled for today to evaluate for heart disease. Transjugular liver biopsy is scheduled for tomorrow (10/16/2015) to further evaluate for liver disease.    Patient is currently undergoing paracentesis every week, removing 5-7 L of fluid every time.    Today, patient reports increase pain and discomfort due to peripheral edema. He is alert and oriented.   ??  ASSESSMENT AND PLAN:  Ascites  Currently unclear if this is secondary to cirrhosis or another cause.  The serum albumin is low and the AST/ALT are in a pattern consistent with cirrhosis. Recent imaging does not suggest cirrhosis. The SAAG is 1.1 which is equivocal for cirrhosis or not.  Although he underwent a liver biopsy at [facility] several years ago and this is reported not to show cirrhosis the specimen is not currently available for review.  We do not know if this was fragmented and misinterpreted and we do not know if hepatic venous pressure measurements were done and if they were done correctly. Hepatic venous pressure measurements and a transjugular liver biopsy has been ordered and scheduled for tomorrow to further evaluate patient. Will review ECHO results to make sure there is no pulmonary hypertension or RV dysfunction.  ??  Chronic liver disease  Serologic studies to evaluate for evidence of chronic liver disease were performed today. Will review results as they come in.    PHYSICAL EXAMINATION:  VS: per nursing note  General:  No acute distress.   Eyes:  Sclera anicteric.   ENT:  No oral lesions.  Thyroid normal.  Nodes:  No adenopathy.   Skin:  No spider angiomata.  No jaundice.  Respiratory:  Lungs clear to auscultation.   Cardiovascular:  Regular heart rate.  Abdomen:  Distended with tenderness upon palpation. Some ascites appears to be present.  Extremities:  +2 lower extremity edema.  No muscle wasting.  Neurologic:  Alert and oriented. Cranial nerves grossly intact.  No asterixis.    LABORATORY:  Results for David [name] (MRN [id number]) as of 10/15/2015 09:49   Ref. Range 10/14/2015 05:55 10/15/2015 04:03   Sodium Latest Ref Range: 136 - 145 mmol/L  139 134 (L)   Potassium Latest Ref Range: 3.5 - 5.1 mmol/L 5.7 (H) 5.9 (H)   Chloride Latest Ref Range: 97 - 108 mmol/L 109 (H) 105   CO2 Latest Ref Range: 21 - 32 mmol/L 18 (L) 15 (LL)   Anion gap Latest Ref Range: 5 - 15 mmol/L 12 14   Glucose Latest Ref Range: 65 - 100 mg/dL 92 78   BUN Latest Ref Range: 6 - 20 MG/DL 62 (H) 65 (H)   Creatinine Latest Ref Range: 0.70 - 1.30 MG/DL 12.90 (H) 13.60 (H)   BUN/Creatinine ratio Latest Ref Range: 12 - 20   5 (L) 5 (L)   Calcium Latest Ref Range: 8.5 - 10.1 MG/DL 6.9 (L) 6.9 (L)   Phosphorus Latest Ref Range: 2.6 - 4.7 MG/DL 6.7 (H) 7.6 (H)   Magnesium Latest Ref Range: 1.6 - 2.4 mg/dL 2.3 2.4    GFR est non-AA Latest Ref Range: >60 ml/min/1.40m 4 (L) 4 (L)   GFR est AA Latest Ref Range: >60 ml/min/1.727m5 (L) 5 (L)   Bilirubin, total Latest Ref Range: 0.2 - 1.0 MG/DL 0.3    Protein, total Latest Ref Range: 6.4 - 8.2 g/dL 5.9 (L)    Albumin Latest Ref Range: 3.5 - 5.0 g/dL 2.5 (L) 2.7 (L)   Globulin Latest Ref Range: 2.0 - 4.0 g/dL 3.4    A-G Ratio Latest Ref Range: 1.1 - 2.2   0.7 (L)    ALT (SGPT) Latest Ref Range: 12 - 78 U/L 18    AST Latest Ref Range: 15 - 37 U/L 24    Alk. phosphatase Latest Ref Range: 45 - 117 U/L 227 (H)    Iron Latest Ref Range: 35 - 150 ug/dL  83   TIBC Latest Ref Range: 250 - 450 ug/dL  285   Iron % saturation Latest Ref Range: 20 - 50 %  29   Ferritin Latest Ref Range: 26 - 388 NG/ML  1247 (H)       David [name] G.Reed PandyNP  [facility] [id number]: [phone]: [phone]

## 2015-10-15 NOTE — Procedures (Signed)
[name]  *** FINAL REPORT ***    Name: David [name], David [name]  MRN: [id number]    Inpatient  DOB: 02 Dec 1970  HIS Order #: [id number]  TRAKnet Visit #: [id number]  Date: 14 Oct 2015    TYPE OF TEST: Peripheral Venous Testing    REASON FOR TEST  Limb swelling    Right Leg:-  Deep venous thrombosis:           No  Superficial venous thrombosis:    No  Deep venous insufficiency:        Not examined  Superficial venous insufficiency: Not examined    Left Leg:-  Deep venous thrombosis:           No  Superficial venous thrombosis:    No  Deep venous insufficiency:        Not examined  Superficial venous insufficiency: Not examined      INTERPRETATION/FINDINGS  PROCEDURE:  Color duplex ultrasound imaging of lower extremity veins.    FINDINGS:       Right: The common femoral, deep femoral, femoral, popliteal,  posterior tibial, peroneal, and great saphenous are patent and without   evidence of thrombus;  each is fully compressible and there is no  narrowing of the flow channel on color Doppler imaging.  Phasic flow  is observed in the common femoral vein.       Left:   The common femoral, deep femoral, femoral, popliteal,  posterior tibial, peroneal, and great saphenous are patent and without   evidence of thrombus;  each is fully compressible and there is no  narrowing of the flow channel on color Doppler imaging.  Phasic flow  is observed in the common femoral vein.    IMPRESSION:  No evidence of right or left lower extremity vein  thrombosis.    ADDITIONAL COMMENTS    I have personally reviewed the data relevant to the interpretation of  this  study.    TECHNOLOGIST: Julie A. Toone, RVS  Signed: 10/14/2015 09:15 AM    PHYSICIAN: Griffen Frayne, MD  Signed: 10/15/2015 07:55 AM

## 2015-10-15 NOTE — Progress Notes (Signed)
Hospitalist Progress Note  Lynden Ang, MD  Office: [phone]        Date of Service:  10/15/2015  NAME:  David [name]  DOB:  09/27/1970  MRN:  [id number]      Admission Summary:   This is a 45 year old man with a past medical history significant for end-stage renal disease on   hemodialysis, hypertension, recurrent C difficile, and asthma, who was in his   usual state of health until a week prior to admission, when he started having increased abdominal pain, distension and diarrhea.  Patient has a history of recurrent C. Diff.  He was admitted to [facility].  Per patient, the test for C. Diff was positive and he was started on treatment.  However, he did not like the service, so left the hospital and came to [facility] instead.     The patient also complains of recurrent abdominal ascites since January this year.  He reports he had a liver biopsy in the past and he was told he has not liver disease.      Patient reports two episodes of spontaneous bacterial peritonitis in the past.       Interval history / Subjective:   Overnight, patient continued to complain of pain and the dose of Dilaudid was increased to every 3h.   Dialysis was attempted using the Left arm AV fistula, but not completed.   Quinton catheter was placed in right groin this afternoon and patient is now waiting to be dialyzed.   A fistulogram is scheduled for tomorrow.     Assessment & Plan:     Diarrhea:  - seems resolved, patient had a formed BM earlier today.   - sample not accepted to be tested for C. Diff;  - second BM loose, C. Diff negative.  - will attempt to get records/Discharge Summary from [facility].   - empirically on PO Vancomycin and IV Flagyl.  - 6/28: per GI, recommend a long Vancomycin taper in the setting of refractory C. Diff. ID following.  Currently on PO Flagyl, continue.     ESRD:  - HD dependent;   - Nephrology consulted: last dialysis on Friday, missed dialysis  yesterday;  - Quinton placed 6/28.   - Plan per Nephrology: daily HD x3.    HTN:  - uncontrolled;  - patient states that his BP drops with Dialysis, so we will monitor for now;   - resume home BP medications when able;   - patient has occasionally refused to take his BP medications.      Recurrent Ascites:  - Hepatology consult appreciated;  - patient reports history of SBP;  - paracentesis on admission: no evidence of SBP;  - reported history of liver biopsy several years ago;  - transjugular liver biopsy scheduled for 6/29.    Asthma:   - stable, no SOB;    Anemia:  - most likely anemia of chronic disease.   - management per Nephrology.     Nicotine addiction:  - patient was counseled on admission;   - declined Nicotine patch;       Code status: Full   DVT prophylaxis: Heparin SC    Care Plan discussed with: Patient/Family and Nurse  Disposition: Home w/Family and TBD, anticipate discharge on Friday 6/30.       Hospital Problems  Date Reviewed: 10/22/2015          Codes Class Noted POA    * (Principal)Gastroenteritis ICD-10-CM: K52.9  ICD-9-CM: 558.9  10/14/2015 Yes                Review of Systems:   Pertinent items are noted in HPI.       Vital Signs:    Last 24hrs VS reviewed since prior progress note. Most recent are:  Visit Vitals   ??? BP (!) 164/101 (BP 1 Location: Right arm, BP Patient Position: At rest)   ??? Pulse 92   ??? Temp 98.1 ??F (36.7 ??C)   ??? Resp 18   ??? Ht 6\' 1"  (1.854 m)   ??? Wt 114.9 kg (253 lb 6.4 oz)   ??? SpO2 100%   ??? BMI 33.43 kg/m2       No intake or output data in the 24 hours ending 10/15/15 1659     Physical Examination:             Constitutional:  No acute distress, cooperative, pleasant??   ENT:  Oral mucous moist, oropharynx benign. Neck supple;,    Resp:  CTA bilaterally. No wheezing/rhonchi/rales. No accessory muscle use   CV:  Regular rhythm, normal rate, no murmurs, gallops, rubs    GI:  Soft, slightly distended, diffusely tender. normoactive bowel sounds;   Paracentesis site RLQ, no drainage or bleeding;     Musculoskeletal:  2+ edema, tense.    Neurologic:  Moves all extremities.  AAOx3, speech clear, memory intact;     Skin:  Good turgor, no rashes or ulcers       Data Review:    Review and/or order of clinical lab test  Review and/or order of tests in the radiology section of CPT  Review and/or order of tests in the medicine section of CPT      Labs:     Recent Labs      10/15/15   0403  10/14/15   0555   WBC  6.2  5.5   HGB  8.3*  8.1*   HCT  26.8*  26.5*   PLT  172  167     Recent Labs      10/15/15   0403  10/14/15   0555  10/13/15   2138   NA  134*  139  138   K  5.9*  5.7*  5.0   CL  105  109*  105   CO2  15*  18*  19*   BUN  65*  62*  92*   CREA  13.60*  12.90*  12.50*   GLU  78  92  95   CA  6.9*  6.9*  7.0*   MG  2.4  2.3   --    PHOS  7.6*  6.7*   --      Recent Labs      10/15/15   0403  10/14/15   0555  10/13/15   2138   SGOT   --   24  24   ALT   --   18  18   AP   --   227*  234*   TBILI   --   0.3  0.3   TP   --   5.9*  6.0*   ALB  2.7*  2.5*  2.6*   GLOB   --   3.4  3.4   LPSE   --    --   465*     Recent Labs      10/13/15   2138   INR  1.1  PTP  11.2*   APTT  32.0      Recent Labs      10/15/15   0403   TIBC  285   PSAT  29   FERR  1247*      No results found for: FOL, RBCF   No results for input(s): PH, PCO2, PO2 in the last 72 hours.  No results for input(s): CPK, CKNDX, TROIQ in the last 72 hours.    No lab exists for component: CPKMB  No results found for: CHOL, CHOLX, CHLST, CHOLV, HDL, LDL, LDLC, DLDLP, TGLX, TRIGL, TRIGP, CHHD, CHHDX  No results found for: GLUCPOC  No results found for: COLOR, APPRN, SPGRU, REFSG, PHU, PROTU, GLUCU, KETU, BILU, UROU, NITU, LEUKU, GLUKE, EPSU, BACTU, WBCU, RBCU, CASTS, UCRY      Medications Reviewed:     Current Facility-Administered Medications   Medication Dose Route Frequency   ??? HYDROmorphone (PF) (DILAUDID) injection 1 mg  1 mg IntraVENous Q3H PRN    ??? metroNIDAZOLE (FLAGYL) tablet 500 mg  500 mg Oral TID   ??? lidocaine (XYLOCAINE) 20 mg/mL (2 %) injection       ??? heparin (porcine) 1,000 unit/mL injection       ??? albuterol (PROVENTIL VENTOLIN) nebulizer solution 2.5 mg  2.5 mg Nebulization Q4H PRN   ??? amLODIPine (NORVASC) tablet 10 mg  10 mg Oral DAILY   ??? carvedilol (COREG) tablet 12.5 mg  12.5 mg Oral BID WITH MEALS   ??? hydrALAZINE (APRESOLINE) tablet 100 mg  100 mg Oral TID   ??? sevelamer carbonate (RENVELA) tab 800 mg  800 mg Oral TID WITH MEALS   ??? acetaminophen (TYLENOL) tablet 650 mg  650 mg Oral Q4H PRN   ??? ondansetron (ZOFRAN) injection 4 mg  4 mg IntraVENous Q4H PRN   ??? heparin (porcine) injection 5,000 Units  5,000 Units SubCUTAneous Q8H   ??? lactobac ac& pc-s.therm-b.anim (FLORA Q/RISAQUAD)  1 Cap Oral DAILY   ??? nicotine (NICODERM CQ) 21 mg/24 hr patch 1 Patch  1 Patch TransDERmal Q24H   ??? 0.9% sodium chloride infusion  100 mL/hr IntraVENous DIALYSIS PRN   ??? albumin human 25% (BUMINATE) solution 12.5 g  12.5 g IntraVENous DIALYSIS PRN   ??? epoetin alfa (EPOGEN;PROCRIT) injection 10,000 Units  10,000 Units SubCUTAneous Q MON, WED & FRI     ______________________________________________________________________  EXPECTED LENGTH OF STAY: 3d 19h  ACTUAL LENGTH OF STAY:          1                 Lynden AngIlona E Charlisha Market, MD

## 2015-10-15 NOTE — Progress Notes (Signed)
Echo complete. Results to follow.

## 2015-10-15 NOTE — Other (Signed)
TRANSFER - OUT REPORT:    Verbal report given to David [name] (name) on [name]  being transferred to 625 (unit) for routine progression of care       Report consisted of patient???s Situation, Background, Assessment and   Recommendations(SBAR).     Information from the following report(s) Procedure Summary and MAR was reviewed with the receiving nurse.    Lines:   Peripheral IV 10/13/15 Right Antecubital (Active)   Site Assessment Clean, dry, & intact 10/13/2015 11:07 PM   Phlebitis Assessment 0 10/13/2015 11:07 PM   Infiltration Assessment 0 10/13/2015 11:07 PM   Dressing Status Clean, dry, & intact 10/13/2015 11:07 PM   Dressing Type Tape;Transparent 10/13/2015 11:07 PM   Hub Color/Line Status Pink;Patent 10/13/2015 11:07 PM        Opportunity for questions and clarification was provided.      Patient transported with:   O2 @ 3 liters          2Karen

## 2015-10-15 NOTE — Procedures (Signed)
PROCEDURE:Quinton catheter placement via right femoral vein.  INDICATION:dialysis.  ANESTHESIA:sedation and local.  COMPLICATION:NONE.  SPECIMENS REMOVED:none.  BLOOD LOSS:NONE.  OPERATOR/ASSISTANT:Jamion Carter, M.D.  RECOMMENDATIONS:may use.  CONSENT OBTAINED:YES.  NOTES:bilaterally occluded IJs and stents in right subclavian vein.

## 2015-10-15 NOTE — Consults (Signed)
Vascular Surgery  --asked to see re: pain in left arm AV fistula  --patient is concerned about central venous stenosis  --will add on for left arm fistulagram tomorrow

## 2015-10-15 NOTE — Procedures (Signed)
[facility]  *** FINAL REPORT ***    Name: David [name], David [name]  MRN: [id number]    Inpatient  DOB: 02 Dec 1970  HIS Order #: [id number]  TRAKnet Visit #: [id number]  Date: 14 Oct 2015    TYPE OF TEST: Peripheral Venous Testing    REASON FOR TEST  Limb swelling    Right Leg:-  Deep venous thrombosis:           No  Superficial venous thrombosis:    No  Deep venous insufficiency:        Not examined  Superficial venous insufficiency: Not examined    Left Leg:-  Deep venous thrombosis:           No  Superficial venous thrombosis:    No  Deep venous insufficiency:        Not examined  Superficial venous insufficiency: Not examined      INTERPRETATION/FINDINGS  PROCEDURE:  Color duplex ultrasound imaging of lower extremity veins.    FINDINGS:       Right: The common femoral, deep femoral, femoral, popliteal,  posterior tibial, peroneal, and great saphenous are patent and without   evidence of thrombus;  each is fully compressible and there is no  narrowing of the flow channel on color Doppler imaging.  Phasic flow  is observed in the common femoral vein.       Left:   The common femoral, deep femoral, femoral, popliteal,  posterior tibial, peroneal, and great saphenous are patent and without   evidence of thrombus;  each is fully compressible and there is no  narrowing of the flow channel on color Doppler imaging.  Phasic flow  is observed in the common femoral vein.    IMPRESSION:  No evidence of right or left lower extremity vein  thrombosis.    ADDITIONAL COMMENTS    I have personally reviewed the data relevant to the interpretation of  this  study.    TECHNOLOGIST: Zenaida DeedJulie A. Donette Larryoone, RVS  Signed: 10/14/2015 09:15 AM    PHYSICIAN: Lenn SinkAvik Layli Capshaw, MD  Signed: 10/15/2015 07:55 AM

## 2015-10-16 ENCOUNTER — Inpatient Hospital Stay: Admit: 2015-10-16 | Payer: MEDICARE

## 2015-10-16 ENCOUNTER — Encounter: Admit: 2015-10-16 | Discharge: 2015-10-16 | Payer: MEDICARE | Attending: Gastroenterology

## 2015-10-16 LAB — CULTURE, STOOL
Campylobacter antigen: NEGATIVE
Shiga toxin-producing E. coli Ag: NEGATIVE

## 2015-10-16 LAB — ANA BY MULTIPLEX FLOW IA, QL
ANA, Direct: NEGATIVE
ANA: NEGATIVE

## 2015-10-16 LAB — CERULOPLASMIN: Ceruloplasmin: 30.4 mg/dL (ref 16.0–31.0)

## 2015-10-16 LAB — OVA & PARASITES, STOOL

## 2015-10-16 LAB — ACTIN (SMOOTH MUSCLE) ANTIBODY
Actin (Smooth Muscle) Ab: 12 Units (ref 0–19)
Smooth Muscle Ab: 12 Units (ref 0–19)

## 2015-10-16 LAB — ALPHA-1-ANTITRYPSIN, TOTAL
AlpHa-1 Antitrypsin: 213 mg/dL — ABNORMAL HIGH (ref 90–200)
Alpha-1 Antitrypsin: 213 mg/dL — ABNORMAL HIGH (ref 90–200)

## 2015-10-16 LAB — OVA & PARASITES, STOOL, REFLEX

## 2015-10-16 LAB — HEPATITIS B CORE AB, TOTAL: Hep B Core Ab, total: NEGATIVE

## 2015-10-16 LAB — HEP A AB, TOTAL: Hep A Ab, total: NEGATIVE

## 2015-10-16 LAB — HEPATITIS B CORE ANTIBODY, TOTAL: Hep B Core Total Ab: NEGATIVE

## 2015-10-16 MED ORDER — FENTANYL CITRATE (PF) 50 MCG/ML IJ SOLN
50 mcg/mL | INTRAMUSCULAR | Status: DC | PRN
Start: 2015-10-16 — End: 2015-10-16

## 2015-10-16 MED ORDER — DIPHENHYDRAMINE HCL 50 MG/ML IJ SOLN
50 mg/mL | INTRAMUSCULAR | Status: DC | PRN
Start: 2015-10-16 — End: 2015-10-16

## 2015-10-16 MED ORDER — METOPROLOL TARTRATE 5 MG/5 ML IV SOLN
5 mg/ mL | INTRAVENOUS | Status: DC | PRN
Start: 2015-10-16 — End: 2015-10-16
  Administered 2015-10-16: 15:00:00 via INTRAVENOUS

## 2015-10-16 MED ORDER — PROPOFOL 10 MG/ML IV EMUL
10 mg/mL | INTRAVENOUS | Status: DC | PRN
Start: 2015-10-16 — End: 2015-10-16
  Administered 2015-10-16 (×7): via INTRAVENOUS

## 2015-10-16 MED ORDER — LIDOCAINE HCL 1 % (10 MG/ML) IJ SOLN
10 mg/mL (1 %) | INTRAMUSCULAR | Status: AC
Start: 2015-10-16 — End: ?

## 2015-10-16 MED ORDER — LIDOCAINE HCL 1 % (10 MG/ML) IJ SOLN
10 mg/mL (1 %) | INTRAMUSCULAR | Status: DC | PRN
Start: 2015-10-16 — End: 2015-10-16
  Administered 2015-10-16: 16:00:00 via SUBCUTANEOUS

## 2015-10-16 MED ORDER — FENTANYL CITRATE (PF) 50 MCG/ML IJ SOLN
50 mcg/mL | INTRAMUSCULAR | Status: DC | PRN
Start: 2015-10-16 — End: 2015-10-16
  Administered 2015-10-16: 15:00:00 via INTRAVENOUS

## 2015-10-16 MED ORDER — IOPAMIDOL 76 % IV SOLN
370 mg iodine /mL (76 %) | INTRAVENOUS | Status: AC
Start: 2015-10-16 — End: ?

## 2015-10-16 MED ORDER — SODIUM CHLORIDE 0.9 % IJ SYRG
INTRAMUSCULAR | Status: DC | PRN
Start: 2015-10-16 — End: 2015-10-16

## 2015-10-16 MED ORDER — SODIUM CHLORIDE 0.9 % IJ SYRG
INTRAMUSCULAR | Status: AC
Start: 2015-10-16 — End: 2015-10-17
  Administered 2015-10-16: 17:00:00

## 2015-10-16 MED ORDER — HYDROMORPHONE (PF) 2 MG/ML IJ SOLN
2 mg/mL | INTRAMUSCULAR | Status: AC
Start: 2015-10-16 — End: ?

## 2015-10-16 MED ORDER — SODIUM BICARBONATE 8.4 % (1 MEQ/ML) IV SYRG
8.4 % (1 mEq/mL) | INTRAVENOUS | Status: AC
Start: 2015-10-16 — End: ?

## 2015-10-16 MED ORDER — IOPAMIDOL 76 % IV SOLN
370 mg iodine /mL (76 %) | INTRAVENOUS | Status: DC | PRN
Start: 2015-10-16 — End: 2015-10-16
  Administered 2015-10-16: 16:00:00 via INTRAVENOUS

## 2015-10-16 MED ORDER — SODIUM CHLORIDE 0.9 % IJ SYRG
INTRAMUSCULAR | Status: DC | PRN
Start: 2015-10-16 — End: 2015-10-17
  Administered 2015-10-16 – 2015-10-17 (×2): via INTRAVENOUS

## 2015-10-16 MED ORDER — FENTANYL CITRATE (PF) 50 MCG/ML IJ SOLN
50 mcg/mL | INTRAMUSCULAR | Status: AC
Start: 2015-10-16 — End: ?

## 2015-10-16 MED ORDER — HYDROMORPHONE (PF) 2 MG/ML IJ SOLN
2 mg/mL | INTRAMUSCULAR | Status: DC | PRN
Start: 2015-10-16 — End: 2015-10-16
  Administered 2015-10-16: 15:00:00 via INTRAVENOUS

## 2015-10-16 MED ORDER — MEPERIDINE (PF) 25 MG/ML INJ SOLUTION
25 mg/ml | INTRAMUSCULAR | Status: DC | PRN
Start: 2015-10-16 — End: 2015-10-16

## 2015-10-16 MED ORDER — BACITRACIN 50,000 UNIT IM
50000 unit | INTRAMUSCULAR | Status: AC
Start: 2015-10-16 — End: ?

## 2015-10-16 MED ORDER — SODIUM CHLORIDE 0.9 % IV
INTRAVENOUS | Status: DC
Start: 2015-10-16 — End: 2015-10-16

## 2015-10-16 MED ORDER — ATRACURIUM 10 MG/ML IV SOLN
10 mg/mL | INTRAVENOUS | Status: AC
Start: 2015-10-16 — End: ?

## 2015-10-16 MED ORDER — LIDOCAINE (PF) 10 MG/ML (1 %) IJ SOLN
10 mg/mL (1 %) | INTRAMUSCULAR | Status: DC | PRN
Start: 2015-10-16 — End: 2015-10-16

## 2015-10-16 MED ORDER — METOPROLOL TARTRATE 5 MG/5 ML IV SOLN
5 mg/ mL | INTRAVENOUS | Status: DC
Start: 2015-10-16 — End: 2015-10-16
  Administered 2015-10-16: 15:00:00 via INTRAVENOUS

## 2015-10-16 MED ORDER — MIDAZOLAM 1 MG/ML IJ SOLN
1 mg/mL | INTRAMUSCULAR | Status: AC
Start: 2015-10-16 — End: ?

## 2015-10-16 MED ORDER — KETAMINE 10 MG/ML IJ SOLN
10 mg/mL | INTRAMUSCULAR | Status: AC
Start: 2015-10-16 — End: ?

## 2015-10-16 MED ORDER — MIDAZOLAM 1 MG/ML IJ SOLN
1 mg/mL | INTRAMUSCULAR | Status: DC | PRN
Start: 2015-10-16 — End: 2015-10-16

## 2015-10-16 MED ORDER — LACTATED RINGERS IV
INTRAVENOUS | Status: DC
Start: 2015-10-16 — End: 2015-10-16
  Administered 2015-10-16: 17:00:00 via INTRAVENOUS

## 2015-10-16 MED ORDER — HEPARIN (PORCINE) 1,000 UNIT/ML IJ SOLN
1000 unit/mL | INTRAMUSCULAR | Status: DC | PRN
Start: 2015-10-16 — End: 2015-10-17

## 2015-10-16 MED ORDER — HEPARIN (PORCINE) 1,000 UNIT/ML IJ SOLN
1000 unit/mL | INTRAMUSCULAR | Status: AC
Start: 2015-10-16 — End: ?

## 2015-10-16 MED ORDER — SODIUM CHLORIDE 0.9 % IJ SYRG
Freq: Three times a day (TID) | INTRAMUSCULAR | Status: DC
Start: 2015-10-16 — End: 2015-10-17
  Administered 2015-10-16 – 2015-10-17 (×3): via INTRAVENOUS

## 2015-10-16 MED ORDER — LACTATED RINGERS IV
INTRAVENOUS | Status: DC
Start: 2015-10-16 — End: 2015-10-16

## 2015-10-16 MED ORDER — ONDANSETRON (PF) 4 MG/2 ML INJECTION
4 mg/2 mL | INTRAMUSCULAR | Status: DC | PRN
Start: 2015-10-16 — End: 2015-10-16

## 2015-10-16 MED ORDER — CHOLESTYRAMINE-ASPARTAME 4 GRAM PACKET
4 gram | Freq: Three times a day (TID) | ORAL | Status: DC
Start: 2015-10-16 — End: 2015-10-17
  Administered 2015-10-16 – 2015-10-17 (×3): via ORAL

## 2015-10-16 MED ORDER — HYDROMORPHONE (PF) 1 MG/ML IJ SOLN
1 mg/mL | INTRAMUSCULAR | Status: DC | PRN
Start: 2015-10-16 — End: 2015-10-16

## 2015-10-16 MED ORDER — HEPARIN (PORCINE) 1,000 UNIT/ML IJ SOLN
1000 unit/mL | INTRAMUSCULAR | Status: DC | PRN
Start: 2015-10-16 — End: 2015-10-16
  Administered 2015-10-16: 16:00:00

## 2015-10-16 MED ORDER — MORPHINE 10 MG/ML INJ SOLUTION
10 mg/ml | INTRAMUSCULAR | Status: DC | PRN
Start: 2015-10-16 — End: 2015-10-16

## 2015-10-16 MED ORDER — EPHEDRINE SULFATE 50 MG/ML IJ SOLN
50 mg/mL | INTRAMUSCULAR | Status: DC | PRN
Start: 2015-10-16 — End: 2015-10-16

## 2015-10-16 MED ORDER — SODIUM CHLORIDE 0.9 % IV
INTRAVENOUS | Status: DC | PRN
Start: 2015-10-16 — End: 2015-10-16
  Administered 2015-10-16: 15:00:00 via INTRAVENOUS

## 2015-10-16 MED ORDER — OXYCODONE-ACETAMINOPHEN 5 MG-325 MG TAB
5-325 mg | ORAL | Status: DC | PRN
Start: 2015-10-16 — End: 2015-10-16

## 2015-10-16 MED FILL — ATRACURIUM 10 MG/ML IV SOLN: 10 mg/mL | INTRAVENOUS | Qty: 10

## 2015-10-16 MED FILL — CARVEDILOL 12.5 MG TAB: 12.5 mg | ORAL | Qty: 1

## 2015-10-16 MED FILL — BACITRACIN 50,000 UNIT IM: 50000 unit | INTRAMUSCULAR | Qty: 50000

## 2015-10-16 MED FILL — LIDOCAINE HCL 1 % (10 MG/ML) IJ SOLN: 10 mg/mL (1 %) | INTRAMUSCULAR | Qty: 20

## 2015-10-16 MED FILL — BD POSIFLUSH NORMAL SALINE 0.9 % INJECTION SYRINGE: INTRAMUSCULAR | Qty: 10

## 2015-10-16 MED FILL — XYLOCAINE-MPF 10 MG/ML (1 %) INJECTION SOLUTION: 10 mg/mL (1 %) | INTRAMUSCULAR | Qty: 2

## 2015-10-16 MED FILL — HYDROMORPHONE (PF) 1 MG/ML IJ SOLN: 1 mg/mL | INTRAMUSCULAR | Qty: 1

## 2015-10-16 MED FILL — SODIUM CHLORIDE 0.9 % IV: INTRAVENOUS | Qty: 100

## 2015-10-16 MED FILL — MIDAZOLAM 1 MG/ML IJ SOLN: 1 mg/mL | INTRAMUSCULAR | Qty: 2

## 2015-10-16 MED FILL — METRONIDAZOLE 250 MG TAB: 250 mg | ORAL | Qty: 2

## 2015-10-16 MED FILL — RENVELA 800 MG TABLET: 800 mg | ORAL | Qty: 1

## 2015-10-16 MED FILL — HEPARIN (PORCINE) 1,000 UNIT/ML IJ SOLN: 1000 unit/mL | INTRAMUSCULAR | Qty: 10

## 2015-10-16 MED FILL — RISAQUAD 8 BILLION CELL CAPSULE: 8 billion cell | ORAL | Qty: 1

## 2015-10-16 MED FILL — LACTATED RINGERS IV: INTRAVENOUS | Qty: 1000

## 2015-10-16 MED FILL — ISOVUE-370  76 % INTRAVENOUS SOLUTION: 370 mg iodine /mL (76 %) | INTRAVENOUS | Qty: 50

## 2015-10-16 MED FILL — SODIUM BICARBONATE 8.4 % (1 MEQ/ML) IV SYRG: 8.4 % (1 mEq/mL) | INTRAVENOUS | Qty: 50

## 2015-10-16 MED FILL — NICOTINE 21 MG/24 HR DAILY PATCH: 21 mg/24 hr | TRANSDERMAL | Qty: 1

## 2015-10-16 MED FILL — FENTANYL CITRATE (PF) 50 MCG/ML IJ SOLN: 50 mcg/mL | INTRAMUSCULAR | Qty: 2

## 2015-10-16 MED FILL — HEPARIN (PORCINE) 5,000 UNIT/ML IJ SOLN: 5000 unit/mL | INTRAMUSCULAR | Qty: 1

## 2015-10-16 MED FILL — DILAUDID (PF) 2 MG/ML INJECTION SOLUTION: 2 mg/mL | INTRAMUSCULAR | Qty: 1

## 2015-10-16 MED FILL — KETAMINE 10 MG/ML IJ SOLN: 10 mg/mL | INTRAMUSCULAR | Qty: 20

## 2015-10-16 MED FILL — SODIUM CHLORIDE 0.9 % IV: INTRAVENOUS | Qty: 1000

## 2015-10-16 MED FILL — CHOLESTYRAMINE LIGHT 4 GRAM POWDER FOR SUSP IN A PACKET: 4 gram | ORAL | Qty: 1

## 2015-10-16 MED FILL — HEPARIN (PORCINE) 1,000 UNIT/ML IJ SOLN: 1000 unit/mL | INTRAMUSCULAR | Qty: 4

## 2015-10-16 MED FILL — HYDRALAZINE 50 MG TAB: 50 mg | ORAL | Qty: 2

## 2015-10-16 MED FILL — METOPROLOL TARTRATE 5 MG/5 ML IV SOLN: 5 mg/ mL | INTRAVENOUS | Qty: 2

## 2015-10-16 MED FILL — DIPRIVAN 10 MG/ML INTRAVENOUS EMULSION: 10 mg/mL | INTRAVENOUS | Qty: 15

## 2015-10-16 NOTE — Progress Notes (Signed)
2345: [facility] nurse came to floor stating she was unable to complete patient's treatment due to an emergent [facility]. Asked if there was a back up, to which the nurse stated that she was the only on call. Nurse stated patient will be dialyzed in the morning.

## 2015-10-16 NOTE — Anesthesia Pre-Procedure Evaluation (Signed)
Anesthetic History   No history of anesthetic complications            Review of Systems / Medical History  Patient summary reviewed, nursing notes reviewed and pertinent labs reviewed    Pulmonary  Within defined limits          Asthma : well controlled       Neuro/Psych   Within defined limits           Cardiovascular  Within defined limits  Hypertension: poorly controlled              Exercise tolerance: >4 METS     GI/Hepatic/Renal  Within defined limits              Endo/Other  Within defined limits      Anemia     Other Findings   Comments: Recurrent c diff  ascites           Physical Exam    Airway  Mallampati: III  TM Distance: > 6 cm  Neck ROM: normal range of motion   Mouth opening: Normal     Cardiovascular  Regular rate and rhythm,  S1 and S2 normal,  no murmur, click, rub, or gallop             Dental  No notable dental hx       Pulmonary  Breath sounds clear to auscultation               Abdominal  GI exam deferred       Other Findings            Anesthetic Plan    ASA: 3  Anesthesia type: general          Induction: Intravenous  Anesthetic plan and risks discussed with: Patient

## 2015-10-16 NOTE — Other (Signed)
TRANSFER - IN REPORT:    Verbal report received from Karen(name) on David [name]  being received from 625(unit) for ordered procedure  Fistula gram left arm    Report consisted of patient???s Situation, Background, Assessment and   Recommendations(SBAR).     Information from the following report(s) SBAR and Recent Results was reviewed with the receiving nurse.    Opportunity for questions and clarification was provided.      Assessment completed upon patient???s arrival to unit and care assumed.     Hyman HopesJennifer S Kareen Hitsman, RN

## 2015-10-16 NOTE — Progress Notes (Signed)
Off the floor, will check back later.

## 2015-10-16 NOTE — Brief Op Note (Signed)
BRIEF OPERATIVE NOTE    Date of Procedure: 10/16/2015   Preoperative Diagnosis: END STAGE RENAL; left arm hemodialysis access pain  Postoperative Diagnosis: END STAGE RENAL ; left arm hemodialysis access pain  Procedure(s):  FISTULAGRAM LEFT ARM WITH FLURO   Surgeon(s) and Role:     * Lenn SinkAvik Rashawnda Gaba, MD - Primary  Assistant Staff:  Surgical Staff:  Circ-1: Christia ReadingLaveta G Hurte, RN  Scrub Tech-1: Floy SabinaAshley Clark  Surg Asst-1: Isaias CowmanJanet L Wingfield, RN  Event Time In   Incision Start 1132   Incision Close 1144     Anesthesia: General   Estimated Blood Loss: none  Specimens: * No specimens in log *   Findings: tortuous vein; no stenosis seen  Complications: none  Implants: * No implants in log *

## 2015-10-16 NOTE — Progress Notes (Addendum)
[place]   [facility]  [address]  [place], [place] [id number]       GI PROGRESS NOTE  Stann Orelizabeth Adams ACNP  [phone]: David [name]   DOB:  13-Jul-1970   MRN:  [id number]       Subjective:     "The same" He still reports looser stools several times a day    Objective:     VITALS:   Last 24hrs VS reviewed since prior progress note. Most recent are:  Visit Vitals   ??? BP 150/86 (BP 1 Location: Right arm, BP Patient Position: At rest)   ??? Pulse 89   ??? Temp 98.5 ??F (36.9 ??C)   ??? Resp 16   ??? Ht 6\' 1"  (1.854 m)   ??? Wt 114.5 kg (252 lb 6.4 oz)   ??? SpO2 95%   ??? BMI 33.3 kg/m2       PHYSICAL EXAM:  General: Cooperative, no acute distress????  Neurologic:?? Alert and oriented X 3.  HEENT: PERRLA, EOMI.   Lungs:   No Wheezing  Heart:  s1 s2  Abdomen: Distended, ascites, tender  Extremities: No edema  Psych:???? agitated.    Lab Data Reviewed:     No results found for this or any previous visit (from the past 24 hour(s)).      ________________________________________________________________________       Assessment:   ?? C-diff - most recent testing is negative.  On flagyl right now.  He continues to report loose stools.  Can add questran.  ID does not feel a vanc taper is needed.  ?? Ascites- work up per Dr Larose KellsShiffman     Patient Active Problem List   Diagnosis Code   ??? Gastroenteritis K52.9     Plan:   ?? Questran TID  ?? Complete flagyl  ?? F/u with [facility] or [facility] for more information on refractory c-diff  ?? Will sign off.     Signed By: Myrtie SomanElizabeth P Adams, NP     10/16/2015  2:09 PM                 Patient seen and examined, agree with plans, little to add from our standpoint.    Briscoe DeutscherIrvin J Guliana Weyandt, MD

## 2015-10-16 NOTE — Progress Notes (Addendum)
Patient is a 45 y/o African-American male [facility]/c [facility] and reported recurrent C-Diff. [facility] this week for c-diff, left AMA. [facility] stool testing negative, ID and GI consulted. [place]. [facility] [name], independent with all ADL/IADL's.     Patient off unit for left arm fistulagram this morning, unavailable to meet with CM. Anticipate patient will discharge home with continued outpatient HD and follow-up as recommended by GI. No skilled discharge needs/case mgmt interventions indicated at this time. CM available for consult should new discharge needs arise. Lew DawesKATEY WEINZIMMER, MSW/CRM    Care Management Interventions  MyChart Signup: No  Discharge Durable Medical Equipment: No  Physical Therapy Consult: No  Occupational Therapy Consult: No  Speech Therapy Consult: No  Confirm Follow Up Transport: Family  Discharge Location  Discharge Placement: Home

## 2015-10-16 NOTE — Progress Notes (Signed)
Name: David [name]   MRN: [id number]  DOB: 18-Jun-1970      Assessment:  ES[facility] (M[facility]; [name])  HTN  Hyperkalemia  Acidosis  Anemia of ESRD  Recurrent C-diff-negative at present  Recurrent Ascites??    Pt got R Femoral quinton yesterday by IR; had HD yesterday. He refused last 30 mins of HD Rx last night. He wanted his access to be looked at. Vascular surgery did angio of his access- and found tortuous vein, but patent. No intervention was required. This was expected by me all along but pt c/o pain.     Plan/Recommendations:  HD today using R Femoral quinton  HD tomm again using his AV Access, as it is patent  If we are able to use his AV access without any trouble, d/c femoral tomm  Check labs with HD  Renal diet  Ct Renvela  Ct EPO      Subjective:  Returned from angio. "They didn't do anything on this"    ROS:   No nausea, no vomiting  No chest pain, no shortness of breath    Exam:  Visit Vitals   ??? BP 150/86 (BP 1 Location: Right arm, BP Patient Position: At rest)   ??? Pulse 89   ??? Temp 98.5 ??F (36.9 ??C)   ??? Resp 16   ??? Ht 6\' 1"  (1.854 m)   ??? Wt 114.5 kg (252 lb 6.4 oz)   ??? SpO2 95%   ??? BMI 33.3 kg/m2       WB/WN in NAD  Pallor+, no icterus  +edema  AVG patent  R femoral cvc intact  AOx3. No myolconus    Current Facility-Administered Medications   Medication Dose Route Frequency Last Dose   ??? sodium chloride (NS) 0.9 % flush         ??? sodium chloride (NS) flush 5-10 mL  5-10 mL IntraVENous Q8H     ??? sodium chloride (NS) flush 5-10 mL  5-10 mL IntraVENous PRN     ??? cholestyramine light (QUESTRAN LITE) packet 4 g  4 g Oral TID WITH MEALS     ??? HYDROmorphone (PF) (DILAUDID) injection 1 mg  1 mg IntraVENous Q3H PRN 1 mg at 10/16/15 1230   ??? metroNIDAZOLE (FLAGYL) tablet 500 mg  500 mg Oral TID 500 mg at 10/16/15 0842    ??? heparin (porcine) 1,000 unit/mL injection 3,200 Units  3,200 Units IntraVENous PRN     ??? albuterol (PROVENTIL VENTOLIN) nebulizer solution 2.5 mg  2.5 mg Nebulization Q4H PRN     ??? amLODIPine (NORVASC) tablet 10 mg  10 mg Oral DAILY Stopped at 10/14/15 0900   ??? carvedilol (COREG) tablet 12.5 mg  12.5 mg Oral BID WITH MEALS Stopped at 10/15/15 1700   ??? hydrALAZINE (APRESOLINE) tablet 100 mg  100 mg Oral TID Stopped at 10/16/15 0900   ??? sevelamer carbonate (RENVELA) tab 800 mg  800 mg Oral TID WITH MEALS 800 mg at 10/16/15 1315   ??? acetaminophen (TYLENOL) tablet 650 mg  650 mg Oral Q4H PRN     ??? ondansetron (ZOFRAN) injection 4 mg  4 mg IntraVENous Q4H PRN 4 mg at 10/14/15 1543   ??? heparin (porcine) injection 5,000 Units  5,000 Units SubCUTAneous Q8H 5,000 Units at 10/15/15 2205   ??? lactobac ac& pc-s.therm-b.anim (FLORA Q/RISAQUAD)  1 Cap Oral DAILY 1 Cap at 10/16/15 0842   ??? nicotine (NICODERM CQ) 21 mg/24 hr patch 1 Patch  1 Patch TransDERmal Q24H     ???  0.9% sodium chloride infusion  100 mL/hr IntraVENous DIALYSIS PRN     ??? albumin human 25% (BUMINATE) solution 12.5 g  12.5 g IntraVENous DIALYSIS PRN     ??? epoetin alfa (EPOGEN;PROCRIT) injection 10,000 Units  10,000 Units SubCUTAneous Q MON, WED & FRI Stopped at 10/15/15 1700       Labs/Data:    Lab Results   Component Value Date/Time    WBC 6.2 10/15/2015 04:03 AM    HGB 8.3 10/15/2015 04:03 AM    HCT 26.8 10/15/2015 04:03 AM    PLATELET 172 10/15/2015 04:03 AM    MCV 95.4 10/15/2015 04:03 AM       Lab Results   Component Value Date/Time    Sodium 134 10/15/2015 04:03 AM    Potassium 5.9 10/15/2015 04:03 AM    Chloride 105 10/15/2015 04:03 AM    CO2 15 10/15/2015 04:03 AM    Anion gap 14 10/15/2015 04:03 AM    Glucose 78 10/15/2015 04:03 AM    BUN 65 10/15/2015 04:03 AM    Creatinine 13.60 10/15/2015 04:03 AM    BUN/Creatinine ratio 5 10/15/2015 04:03 AM    GFR est AA 5 10/15/2015 04:03 AM    GFR est non-AA 4 10/15/2015 04:03 AM     Calcium 6.9 10/15/2015 04:03 AM       Wt Readings from Last 3 Encounters:   10/16/15 114.5 kg (252 lb 6.4 oz)         Intake/Output Summary (Last 24 hours) at 10/16/15 1508  Last data filed at 10/16/15 1145   Gross per 24 hour   Intake              340 ml   Output             1481 ml   Net            -1141 ml       Patient seen and examined. Chart reviewed. Labs, data and other pertinent notes reviewed in last 24 hrs.    PMH/SH/FH reviewed and unchanged compared to H&P    Discussed with pt/RN and Dr. Jasmine AweMukherjee      David Amory M Estiben Mizuno, MD

## 2015-10-16 NOTE — Other (Signed)
TRANSFER - OUT REPORT:    Verbal report given to Clydie BraunKaren rn(name) on Bernerd Limboroy Posas  being transferred to 625(unit) for routine progression of care       Report consisted of patient???s Situation, Background, Assessment and   Recommendations(SBAR).     Time Pre op antibiotic given:0  Anesthesia Stop time: 1155  Foley Present on Transfer to floor:no  Order for Foley on Chart:no    Information from the following report(s) Intake/Output and MAR was reviewed with the receiving nurse.    Opportunity for questions and clarification was provided.     Is the patient on 02? NO       L/Min 0       Other 0    Is the patient on a monitor? NO    Is the nurse transporting with the patient? NO    Surgical Waiting Area notified of patient's transfer from PACU? NO      The following personal items collected during your admission accompanied patient upon transfer:   Dental Appliance: Dental Appliances: None  Vision: Visual Aid: Glasses, At bedside  Hearing Aid:    Jewelry: Jewelry: None  Clothing: Clothing: None  Other Valuables: Other Valuables: None  Valuables sent to safe:

## 2015-10-16 NOTE — Anesthesia Post-Procedure Evaluation (Signed)
Post-Anesthesia Evaluation and Assessment  Patient: David [name] MRN: [id number]  SSN: [id number]    Date of Birth: 06-Nov-1970  Age: 45 y.o.  Sex: male       Cardiovascular Function/Vital Signs  Visit Vitals   ??? BP 150/86 (BP 1 Location: Right arm, BP Patient Position: At rest)   ??? Pulse 89   ??? Temp 36.9 ??C (98.5 ??F)   ??? Resp 16   ??? Ht 6\' 1"  (1.854 m)   ??? Wt 114.5 kg (252 lb 6.4 oz)   ??? SpO2 95%   ??? BMI 33.3 kg/m2       Patient is status post general anesthesia for Procedure(s):  FISTULAGRAM LEFT ARM WITH FLURO .    Nausea/Vomiting: None    Postoperative hydration reviewed and adequate.    Pain:  Pain Scale 1: Numeric (0 - 10) (10/16/15 1233)  Pain Intensity 1: 7 (pain in right groin and back) (10/16/15 1233)   Managed    Neurological Status:   Neuro  Neurologic State: Alert (10/16/15 1233)  LUE Motor Response: Purposeful (10/16/15 1233)  LLE Motor Response: Purposeful (10/16/15 1233)  RUE Motor Response: Purposeful (10/16/15 1233)  RLE Motor Response: Purposeful (10/16/15 1233)   At baseline    Mental Status and Level of Consciousness: Arousable    Pulmonary Status:   O2 Device: Room air (10/16/15 1233)   Adequate oxygenation and airway patent    Complications related to anesthesia: None    Post-anesthesia assessment completed. No concerns    Signed By: Thane Eduonald G Rose-Marie Hickling, MD     October 16, 2015

## 2015-10-16 NOTE — Other (Incomplete)
Called Dr. Allena KatzPatel, Jarvis NewcomerBhavesh for patient's treatment to be bumped first thing in the morning due to an urgent call from Dr. Mariel CraftHartle from another hospital. Nurse Andee PolesKailyn aware.

## 2015-10-16 NOTE — Progress Notes (Signed)
Bedside shift change report given to Kailynn,RN (oncoming nurse) by Karen,RN (offgoing nurse). Report included the following information SBAR and Kardex.

## 2015-10-16 NOTE — Progress Notes (Signed)
[place]  ??   Thedore Mins, MD, FACP, [facility]   Tish Men, NP    Arlyss Queen, PA-C    Demetrios Isaacs, NP   ??     at [facility]     [address] [place]  [address]     [phone]     FAX: [phone]    at [facility]     [address], [place]  [address]     [phone]     FAX: [phone]   ??  ??  HEPATOLOGY PROGRESS NOTE  I have reviewed the events of the past 24 hours including all physician notes, vital signs, medications the patient has received, laboratory studies, radiology exams.  ??  The patient is a 45 y.o. Black male with ESRD on hemodialysis.??He developed ESRD secondary to HTN and started dialysis several years ago. ??He developed ascites for the first time soon thereafter. He requiring paracentesis to remove 5-7 liters of ascites every week.    He was evaluated for liver disease and cirrhosis at [facility]. ??He underwent a liver biopsy there and was told he did not have cirrhosis. He also reports having tests on his heart and was told he did not have heart disease.??  ??  ECHO was done yesterday and showed normal heart function and no evidence of pulmonary HTN.      He will go for hepatic venous pressure measurements and transjugular liver biopsy today.  I doubt the biopsy results will be back before July 5.  I will not be here after today and will return July 5.  He can be discharged if he is ready and I will see him for follow-up in my office.  ??  ASSESSMENT AND PLAN:  Ascites  I supect he does not have cirrhosis and ascites is due to ineffective dialysis.  For hepatic venous pressure measurements and transjugular liver biopsy today to evaluate this.    ????  Chronic liver disease  Serologic studies to evaluate for evidence of chronic liver disease are negative for HBV, HCV and A1AT.  Doubt any of the other serologies will be positive.  ??  PHYSICAL EXAMINATION:   VS:??per nursing note  General:????No acute distress.   Eyes:????Sclera anicteric.   ENT:????No oral lesions. ??Thyroid normal.  Nodes:????No adenopathy.   Skin:????No spider angiomata. ??No jaundice.  Respiratory:????Lungs clear to auscultation.   Cardiovascular:????Regular heart rate.  Abdomen:????Distended with tenderness upon palpation. Some ascites appears to be present.  Extremities:????+2 lower extremity edema. ??No muscle wasting.  Neurologic:????Alert and oriented. Cranial nerves grossly intact. ??No asterixis.  ??  LABORATORY:  Results for DAEMIEN, FRONCZAK (MRN [id number]) as of 10/16/2015 06:42   Ref. Range 10/13/2015 21:38 10/14/2015 05:55 10/15/2015 04:03   WBC Latest Ref Range: 4.1 - 11.1 K/uL 6.4 5.5 6.2   HGB Latest Ref Range: 12.1 - 17.0 g/dL 8.4 (L) 8.1 (L) 8.3 (L)   PLATELET Latest Ref Range: 150 - 400 K/uL 169 167 172   INR Latest Ref Range: 0.9 - 1.1   1.1     Sodium Latest Ref Range: 136 - 145 mmol/L 138 139 134 (L)   Potassium Latest Ref Range: 3.5 - 5.1 mmol/L 5.0 5.7 (H) 5.9 (H)   Chloride Latest Ref Range: 97 - 108 mmol/L 105 109 (H) 105   CO2 Latest Ref Range: 21 - 32  mmol/L 19 (L) 18 (L) 15 (LL)   Glucose Latest Ref Range: 65 - 100 mg/dL 95 92 78   BUN Latest Ref Range: 6 - 20 MG/DL 92 (H) 62 (H) 65 (H)   Creatinine Latest Ref Range: 0.70 - 1.30 MG/DL 12.50 (H) 12.90 (H) 13.60 (H)   Bilirubin, total Latest Ref Range: 0.2 - 1.0 MG/DL 0.3 0.3    Albumin Latest Ref Range: 3.5 - 5.0 g/dL 2.6 (L) 2.5 (L) 2.7 (L)   ALT (SGPT) Latest Ref Range: 12 - 78 U/L 18 18    AST Latest Ref Range: 15 - 37 U/L 24 24    Alk. phosphatase Latest Ref Range: 45 - 117 U/L 234 (H) 227 (H)        .ECHO:  Normal LVEF 55-60%.  Normal RV.  Mild TR.  No pulmonary HTN.      Thedore Mins, MD  [facility], [place]  [address]  [phone]

## 2015-10-16 NOTE — Op Note (Signed)
[facility]   [address]   [place], [place] [id number]   OP NOTE       Name:  [name], David   MR#:  [id number]   DOB:  20-Apr-1970   Account #:  [id number]    Surgery Date:  10/16/2015   Date of Adm:  10/13/2015       PREOPERATIVE DIAGNOSES    1. End-stage renal disease.   2. Left arm hemodialysis access pain.       POSTOPERATIVE DIAGNOSES    1. End-stage renal disease.    2. Left arm hemodialysis access pain.     PROCEDURES PERFORMED: Left arm fistulogram.    SURGEON: Lenn SinkAvik Izyk Marty, MD    ANESTHESIA: Local with sedation.    SPECIMEN: None.    ESTIMATED BLOOD LOSS: None.    COMPLICATIONS: None.     INDICATIONS FOR PROCEDURE: The patient is a 45 year old male   on dialysis for end-stage renal disease. He has been complaining of   pain during dialysis and had a recent fistulogram 2 weeks ago, during   which apparently the central vein was angioplastied. He feels that this   problem has recurred. We were asked to perform fistulogram. The risks   and benefits of the procedure were discussed preoperatively with the   patient. He voiced understanding and wished to proceed.    DESCRIPTION OF PROCEDURE: The patient was placed supine on   the angio table. The left arm was prepped and draped in the standard   surgical fashion. Sedation was established. An area of the distal fistula,   near the anastomosis, essentially the skin above this, was infiltrated   with 1% lidocaine. An access needle was placed, followed by a 6-  [place] sheath. A left arm fistulogram was performed from the   anastomotic area to the SVC and the right atrium. This is a   brachiocephalic fistula with a fair amount of tortuosity and some   aneurysmal areas, but no hemodynamically significant stenosis was   seen. Following this, the sheath was removed and a figure of eight   suture was placed at the puncture site. A dry sterile dressing was   applied. The patient was then awoke and transferred to the recovery   room in stable  condition.        Lenn SinkAVIK Zeph Riebel, MD      AM / THS   D:  10/16/2015   11:53   T:  10/17/2015   08:22   Job #:  [id number]

## 2015-10-17 ENCOUNTER — Inpatient Hospital Stay: Admit: 2015-10-17 | Payer: MEDICARE

## 2015-10-17 LAB — CBC WITH AUTOMATED DIFF
ABS. BASOPHILS: 0 10*3/uL (ref 0.0–0.1)
ABS. EOSINOPHILS: 0.7 10*3/uL — ABNORMAL HIGH (ref 0.0–0.4)
ABS. LYMPHOCYTES: 0.6 10*3/uL — ABNORMAL LOW (ref 0.8–3.5)
ABS. MONOCYTES: 0.3 10*3/uL (ref 0.0–1.0)
ABS. NEUTROPHILS: 3.9 10*3/uL (ref 1.8–8.0)
BASOPHILS: 0 % (ref 0–1)
EOSINOPHILS: 13 % — ABNORMAL HIGH (ref 0–7)
HCT: 24.5 % — ABNORMAL LOW (ref 36.6–50.3)
HGB: 7.5 g/dL — ABNORMAL LOW (ref 12.1–17.0)
LYMPHOCYTES: 10 % — ABNORMAL LOW (ref 12–49)
MCH: 29.2 PG (ref 26.0–34.0)
MCHC: 30.6 g/dL (ref 30.0–36.5)
MCV: 95.3 FL (ref 80.0–99.0)
MONOCYTES: 6 % (ref 5–13)
NEUTROPHILS: 71 % (ref 32–75)
PLATELET: 152 10*3/uL (ref 150–400)
RBC: 2.57 M/uL — ABNORMAL LOW (ref 4.10–5.70)
RDW: 17 % — ABNORMAL HIGH (ref 11.5–14.5)
WBC: 5.5 10*3/uL (ref 4.1–11.1)

## 2015-10-17 LAB — METABOLIC PANEL, BASIC
Anion gap: 14 mmol/L (ref 5–15)
BUN/Creatinine ratio: 4 — ABNORMAL LOW (ref 12–20)
BUN: 53 MG/DL — ABNORMAL HIGH (ref 6–20)
CO2: 21 mmol/L (ref 21–32)
Calcium: 6 MG/DL — CL (ref 8.5–10.1)
Chloride: 102 mmol/L (ref 97–108)
Creatinine: 12 MG/DL — ABNORMAL HIGH (ref 0.70–1.30)
GFR est AA: 6 mL/min/{1.73_m2} — ABNORMAL LOW (ref >60)
GFR est non-AA: 5 mL/min/{1.73_m2} — ABNORMAL LOW (ref >60)
Glucose: 127 mg/dL — ABNORMAL HIGH (ref 65–100)
Potassium: 4.8 mmol/L (ref 3.5–5.1)
Sodium: 137 mmol/L (ref 136–145)

## 2015-10-17 LAB — PHOSPHORUS: Phosphorus: 6.6 MG/DL — ABNORMAL HIGH (ref 2.6–4.7)

## 2015-10-17 MED ORDER — HYDROMORPHONE (PF) 1 MG/ML IJ SOLN
1 mg/mL | Freq: Once | INTRAMUSCULAR | Status: AC
Start: 2015-10-17 — End: 2015-10-17
  Administered 2015-10-17: 21:00:00 via INTRAVENOUS

## 2015-10-17 MED ORDER — BACITRACIN 500 UNIT/G TOPICAL PACKET
500 unit/gram | CUTANEOUS | Status: DC
Start: 2015-10-17 — End: 2015-10-17

## 2015-10-17 MED ORDER — METRONIDAZOLE 500 MG TAB
500 mg | ORAL_TABLET | Freq: Three times a day (TID) | ORAL | 0 refills | Status: AC
Start: 2015-10-17 — End: 2015-10-27

## 2015-10-17 MED FILL — HYDROMORPHONE (PF) 1 MG/ML IJ SOLN: 1 mg/mL | INTRAMUSCULAR | Qty: 1

## 2015-10-17 MED FILL — HYDRALAZINE 50 MG TAB: 50 mg | ORAL | Qty: 2

## 2015-10-17 MED FILL — BD POSIFLUSH NORMAL SALINE 0.9 % INJECTION SYRINGE: INTRAMUSCULAR | Qty: 10

## 2015-10-17 MED FILL — CHOLESTYRAMINE LIGHT 4 GRAM POWDER FOR SUSP IN A PACKET: 4 gram | ORAL | Qty: 1

## 2015-10-17 MED FILL — CARVEDILOL 12.5 MG TAB: 12.5 mg | ORAL | Qty: 1

## 2015-10-17 MED FILL — METRONIDAZOLE 250 MG TAB: 250 mg | ORAL | Qty: 2

## 2015-10-17 MED FILL — BACITRACIN 500 UNIT/G TOPICAL PACKET: 500 unit/gram | CUTANEOUS | Qty: 1

## 2015-10-17 MED FILL — RENVELA 800 MG TABLET: 800 mg | ORAL | Qty: 1

## 2015-10-17 MED FILL — AMLODIPINE 5 MG TAB: 5 mg | ORAL | Qty: 2

## 2015-10-17 MED FILL — PROCRIT 10,000 UNIT/ML INJECTION SOLUTION: 10000 unit/mL | INTRAMUSCULAR | Qty: 1

## 2015-10-17 MED FILL — HEPARIN (PORCINE) 5,000 UNIT/ML IJ SOLN: 5000 unit/mL | INTRAMUSCULAR | Qty: 1

## 2015-10-17 NOTE — Progress Notes (Signed)
Pt admitted with ??CDiff.  Pt is independent with adls, [place], per nephrology has been non compliant with HD.  Consult received to send chart to [facility] which I've done.      Care Management Interventions  MyChart Signup: No  Discharge Durable Medical Equipment: No  Physical Therapy Consult: No  Occupational Therapy Consult: No  Speech Therapy Consult: No  Confirm Follow Up Transport: Family  Discharge Location  Discharge Placement: Home

## 2015-10-17 NOTE — Progress Notes (Signed)
Bedside shift change report given to Gina Peterson RN (oncoming nurse) by Kailynn RN (offgoing nurse). Report included the following information SBAR and MAR.

## 2015-10-17 NOTE — Progress Notes (Signed)
Hospitalist f/u:   Patient is requesting to leave AMA as soon as Dilaudid was changed from IV to PO.   Patient had completed dialysis yesterday and partial today.   Plan was for him to be dialyzed tomorrow and be discharged after dialysis.   I will discharge him today per his request.   He has been fully dressed and walking independently in the hallways.   Quinton must be removed prior to discharge.     Sherlynn CarbonI. Timothee Gali, M.D.

## 2015-10-17 NOTE — Progress Notes (Signed)
Hospitalist Progress Note  Lynden AngIlona E Toia Micale, MD  Office: [phone]        Date of Service:  10/17/2015  NAME:  David Limboroy Mantei  DOB:  1970/10/05  MRN:  [id number]      Admission Summary:   This is a 45 year old man with a past medical history significant for end-stage renal disease on   hemodialysis, hypertension, recurrent C difficile, and asthma, who was in his   usual state of health until a week prior to admission, when he started having increased abdominal pain, distension and diarrhea.  Patient has a history of recurrent C. Diff.  [facility].  Per patient, the test for C. Diff was positive and he was started on treatment.  However, he did not like the service, so left the hospital and [facility].     The patient also complains of recurrent abdominal ascites since January this year.  He reports he had a liver biopsy in the past and he was told he has not liver disease.      Patient reports two episodes of spontaneous bacterial peritonitis in the past.       Interval history / Subjective:   Patient insists that he needs a paracentesis.  Discussed with him at length: at this time paracentesis is not indicated and he needs timely and consistent dialysis to remove excess fluid.  Patient argues that dialysis is not enough to remove the fluid.  Also discussed that repeated taps put him at risk for infection.      Assessment & Plan:     Diarrhea: resolved  - seems resolved, patient had a formed BM earlier today.   - sample not accepted to be tested for C. Diff;  - second BM loose, C. Diff negative.  - will attempt to get records/[facility]charge [facility].   - empirically on PO Vancomycin and IV Flagyl.  - 6/28: per GI, recommend a long Vancomycin taper in the setting of refractory C. Diff. ID following.  Currently on PO Flagyl, continue.   - 6/30: no more diarrhea.      ESRD:  - HD dependent;    - Nephrology consulted: last dialysis on Friday, missed dialysis yesterday;  - 6/28: Dialysis was attempted using the Left arm AV fistula, but not completed because of pain/ no flow.  Quinton catheter was placed in right groin this afternoon.  - Plan per Nephrology: daily HD x3.  - Fistulogram 6/29: no stenosis.   - 6/30: Dialysis yesterday and about 1 h today, interrupted due to increased SOB.  Per report, the flow was also slow through the [place].   - Fluid restriction.     HTN:  - uncontrolled;  - patient states that his BP drops with Dialysis, so we will monitor for now;   - resume home BP medications when able;   - patient has occasionally refused to take his BP medications.      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   - patient can f/u with Hepatology as outpatient, but the impression is that the ascites is probably due to lack of efficient fluid removal.    Asthma:   - stable, no SOB;    Anemia:  -  most likely anemia of chronic disease.   - management per Nephrology.     Nicotine addiction:  - patient was counseled on admission;   - declined Nicotine patch;       Code status: Full   DVT prophylaxis: Heparin SC    Care Plan discussed with: Patient/Family and Nurse  Disposition: Home w/Family and TBD, anticipate discharge on Saturday 7/1 after dialysis.  Remove Quinton.          Hospital Problems  Date Reviewed: 10/26/15          Codes Class Noted POA    * (Principal)Gastroenteritis ICD-10-CM: K52.9  ICD-9-CM: 558.9  10/14/2015 Yes                Review of Systems:   Pertinent items are noted in HPI.       Vital Signs:    Last 24hrs VS reviewed since prior progress note. Most recent are:  Visit Vitals   ??? BP 160/68   ??? Pulse 88   ??? Temp 98 ??F (36.7 ??C)   ??? Resp 16    ??? Ht  (1.854 m)   ??? Wt 114.7 kg (252 lb 12.8 oz)   ??? SpO2 98%   ??? BMI 33.35 kg/m2       No intake or output data in the 24 hours ending 10/17/15 1512     Physical Examination:             Constitutional:  No acute distress, cooperative, pleasant??   ENT:  Oral mucous moist, oropharynx benign. Neck supple;,    Resp:  CTA bilaterally. No wheezing/rhonchi/rales. No accessory muscle use   CV:  Regular rhythm, normal rate, no murmurs, gallops, rubs    GI:  Soft, slightly distended, diffusely tender. normoactive bowel sounds;  Paracentesis site RLQ, no drainage or bleeding;     Musculoskeletal:  2+ edema, tense.    Neurologic:  Moves all extremities.  AAOx3, speech clear, memory intact;     Skin:  Good turgor, no rashes or ulcers       Data Review:    Review and/or order of clinical lab test  Review and/or order of tests in the radiology section of CPT  Review and/or order of tests in the medicine section of CPT      Labs:     Recent Labs      10/17/15   0849  10/15/15   0403   WBC  5.5  6.2   HGB  7.5*  8.3*   HCT  24.5*  26.8*   PLT  152  172     Recent Labs      10/17/15   0849  10/15/15   0403   NA  137  134*   K  4.8  5.9*   CL  102  105   CO2  21  15*   BUN  53*  65*   CREA  12.00*  13.60*   GLU  127*  78   CA  6.0*  6.9*   MG   --   2.4   PHOS  6.6*  7.6*     Recent Labs      10/15/15   0403   ALB  2.7*     No results for input(s): INR, PTP, APTT in the last 72 hours.    No lab exists for component: Rennie Natter   Recent Labs      10/15/15   0403  TIBC  285   PSAT  29   FERR  1247*      No results found for: FOL, RBCF   No results for input(s): PH, PCO2, PO2 in the last 72 hours.  No results for input(s): CPK, CKNDX, TROIQ in the last 72 hours.    No lab exists for component: CPKMB  No results found for: CHOL, CHOLX, CHLST, CHOLV, HDL, LDL, LDLC, DLDLP, TGLX, TRIGL, TRIGP, CHHD, CHHDX  No results found for: GLUCPOC  No results found for: COLOR, APPRN, SPGRU, REFSG, PHU, PROTU, GLUCU, KETU,  BILU, UROU, NITU, LEUKU, GLUKE, EPSU, BACTU, WBCU, RBCU, CASTS, UCRY      Medications Reviewed:     Current Facility-Administered Medications   Medication Dose Route Frequency   ??? sodium chloride (NS) flush 5-10 mL  5-10 mL IntraVENous Q8H   ??? sodium chloride (NS) flush 5-10 mL  5-10 mL IntraVENous PRN   ??? cholestyramine light (QUESTRAN LITE) packet 4 g  4 g Oral TID WITH MEALS   ??? HYDROmorphone (PF) (DILAUDID) injection 1 mg  1 mg IntraVENous Q3H PRN   ??? metroNIDAZOLE (FLAGYL) tablet 500 mg  500 mg Oral TID   ??? heparin (porcine) 1,000 unit/mL injection 3,200 Units  3,200 Units IntraVENous PRN   ??? albuterol (PROVENTIL VENTOLIN) nebulizer solution 2.5 mg  2.5 mg Nebulization Q4H PRN   ??? amLODIPine (NORVASC) tablet 10 mg  10 mg Oral DAILY   ??? carvedilol (COREG) tablet 12.5 mg  12.5 mg Oral BID WITH MEALS   ??? hydrALAZINE (APRESOLINE) tablet 100 mg  100 mg Oral TID   ??? sevelamer carbonate (RENVELA) tab 800 mg  800 mg Oral TID WITH MEALS   ??? acetaminophen (TYLENOL) tablet 650 mg  650 mg Oral Q4H PRN   ??? ondansetron (ZOFRAN) injection 4 mg  4 mg IntraVENous Q4H PRN   ??? heparin (porcine) injection 5,000 Units  5,000 Units SubCUTAneous Q8H   ??? lactobac ac& pc-s.therm-b.anim (FLORA Q/RISAQUAD)  1 Cap Oral DAILY   ??? nicotine (NICODERM CQ) 21 mg/24 hr patch 1 Patch  1 Patch TransDERmal Q24H   ??? 0.9% sodium chloride infusion  100 mL/hr IntraVENous DIALYSIS PRN   ??? albumin human 25% (BUMINATE) solution 12.5 g  12.5 g IntraVENous DIALYSIS PRN   ??? epoetin alfa (EPOGEN;PROCRIT) injection 10,000 Units  10,000 Units SubCUTAneous Q MON, WED & FRI     ______________________________________________________________________  EXPECTED LENGTH OF STAY: 3d 19h  ACTUAL LENGTH OF STAY:          3                 Lynden AngIlona E Daven Pinckney, MD

## 2015-10-17 NOTE — Progress Notes (Addendum)
Bedside and Verbal shift change report given to Debbie (Cabin crewoncoming nurse) by Almira CoasterGina (offgoing nurse). Report included the following information SBAR, Kardex, MAR and Recent Results.     Discharge instructions reviewed with patient, IV removed with no signs of bleeding.  Femoral quinton removed by CCU with no signs of bleeding, prescriptions received and reviewed.  Patient to drive himself home. Car in ED parking lot approved by MD per Almira CoasterGina, day shift nurse.

## 2015-10-17 NOTE — Op Note (Signed)
[facility]   [address]   [place], [place] [id number]   OP NOTE       Name:  [name], David   MR#:  [id number]   DOB:  [birth year]   Account #:  [id number]    Surgery Date:  10/16/2015   Date of Adm:  10/13/2015       PREOPERATIVE DIAGNOSES    1. End-stage renal disease.   2. Left arm hemodialysis access pain.       POSTOPERATIVE DIAGNOSES    1. End-stage renal disease.    2. Left arm hemodialysis access pain.     PROCEDURES PERFORMED: Left arm fistulogram.    SURGEON: Lenn SinkAvik Leonid Manus, MD    ANESTHESIA: Local with sedation.    SPECIMEN: None.    ESTIMATED BLOOD LOSS: None.    COMPLICATIONS: None.     INDICATIONS FOR PROCEDURE: The patient is a 45 year old male   on dialysis for end-stage renal disease. He has been complaining of   pain during dialysis and had a recent fistulogram 2 weeks ago, during   which apparently the central vein was angioplastied. He feels that this   problem has recurred. We were asked to perform fistulogram. The risks   and benefits of the procedure were discussed preoperatively with the   patient. He voiced understanding and wished to proceed.    DESCRIPTION OF PROCEDURE: The patient was placed supine on   the angio table. The left arm was prepped and draped in the standard   surgical fashion. Sedation was established. An area of the distal fistula,   near the anastomosis, essentially the skin above this, was infiltrated   with 1% lidocaine. An access needle was placed, followed by a 6-  [place] sheath. A left arm fistulogram was performed from the   anastomotic area to the SVC and the right atrium. This is a   brachiocephalic fistula with a fair amount of tortuosity and some   aneurysmal areas, but no hemodynamically significant stenosis was   seen. Following this, the sheath was removed and a figure of eight   suture was placed at the puncture site. A dry sterile dressing was   applied. The patient was then awoke and transferred to the recovery   room in stable condition.         Lenn SinkAVIK Alita Waldren, MD      AM / THS   D:  10/16/2015   11:53   T:  10/17/2015   08:22   Job #:  [id number]

## 2015-10-17 NOTE — Progress Notes (Addendum)
Notified by dialysis nurse that patient requesting to be taken off dialysis w/ "increased work of breathing"; noted that patient refused some morning BP medications before going down to dialysis; also noted that before leaving patient asked this nurse to bring his pain medication TO him in dialysis.      1612:  Patient notified that CCU nurse coming to remove femoral dialysis access and that once done Dr. Lyndel PleasureKalka will discharge him.

## 2015-10-17 NOTE — Other (Signed)
TRANSFER - IN REPORT:    Verbal report received from Jina(name) on Bernerd Limboroy Favila  being received from 6E(unit) for routine progression of care      Report consisted of patient???s Situation, Background, Assessment and   Recommendations(SBAR).     Information from the following report(s) SBAR was reviewed with the receiving nurse.    Opportunity for questions and clarification was provided.      Assessment completed upon patient???s arrival to unit and care assumed.

## 2015-10-17 NOTE — Progress Notes (Signed)
Name: David [name]   MRN: [id number]  DOB: 1971-01-13      Assessment:  ESRD (MWF; [name])  HTN  Hyperkalemia-resolved  Acidosis  Anemia of ESRD  Recurrent C-diff-negative on this admit  Recurrent Ascites??    Pt was planned for 2nd HD Rx yesterday, which was bumped today due to emergency on other case. I was notified last midnight. Plan was to do HD today using L AVF. Pt refused initially to use his L AV access. R Femoral catheter was used but had high access pressure. Subsequently he allowed use of his L AVF and ran for approx 30 min. Then he asked to stop HD- as he was sob/and arm was hurting. Pt had angiogram by Vascular surgery and no abnormalities found. Pt with difficult to care. Requests to stop HD often.   Had long discussion with him today>>as to be co-operative and asked him how he wants us to help him. Counseled against stopping HD. Explain there is nothing wrong this his L AVF and should be used. Even if he is SOB, he needs HD to help his breathing. He is resting comfortably when I saw him. Agrees to use L AVF tomm. Pt seems fixated on pain    Plan/Recommendations:  HD tomm using L AVF  Remove R femoral tomm, once we use L AVF   R femoral quinton should be removed before d/c  Renal diet  Ct Renvela  Ct EPO  If we dont have plans for Transjugular biopsy, C-diff is negative, then would d/c home soon    Subjective:  Resting. Refused HD after 30 mins due to sob/arm pain  R femoral did not work    ROS:   No nausea, no vomiting  No chest pain, + shortness of breath    Exam:  Visit Vitals   ??? BP 160/68   ??? Pulse 88   ??? Temp 98 ??F (36.7 ??C)   ??? Resp 16   ??? Ht 6\' 1"  (1.854 m)   ??? Wt 114.7 kg (252 lb 12.8 oz)   ??? SpO2 98%   ??? BMI 33.35 kg/m2       WB/WN in NAD  Pallor+, no icterus  Dec BS, but no distress. No rales  RRR, no rub  +edema  R AV access patent   AOx3. No myolconus    Current Facility-Administered Medications   Medication Dose Route Frequency Last Dose   ??? sodium chloride (NS) flush 5-10 mL  5-10 mL IntraVENous Q8H 10 mL at 10/16/15 2148   ??? sodium chloride (NS) flush 5-10 mL  5-10 mL IntraVENous PRN 10 mL at 10/17/15 0952   ??? cholestyramine light (QUESTRAN LITE) packet 4 g  4 g Oral TID WITH MEALS 4 g at 10/17/15 1250   ??? HYDROmorphone (PF) (DILAUDID) injection 1 mg  1 mg IntraVENous Q3H PRN 1 mg at 10/17/15 1250   ??? metroNIDAZOLE (FLAGYL) tablet 500 mg  500 mg Oral TID 500 mg at 10/16/15 2148   ??? heparin (porcine) 1,000 unit/mL injection 3,200 Units  3,200 Units IntraVENous PRN     ??? albuterol (PROVENTIL VENTOLIN) nebulizer solution 2.5 mg  2.5 mg Nebulization Q4H PRN     ??? amLODIPine (NORVASC) tablet 10 mg  10 mg Oral DAILY 10 mg at 10/17/15 0947   ??? carvedilol (COREG) tablet 12.5 mg  12.5 mg Oral BID WITH MEALS Stopped at 10/15/15 1700   ??? hydrALAZINE (APRESOLINE) tablet 100 mg  100 mg Oral TID 100 mg  at 10/17/15 0948   ??? sevelamer carbonate (RENVELA) tab 800 mg  800 mg Oral TID WITH MEALS 800 mg at 10/17/15 1250   ??? acetaminophen (TYLENOL) tablet 650 mg  650 mg Oral Q4H PRN     ??? ondansetron (ZOFRAN) injection 4 mg  4 mg IntraVENous Q4H PRN 4 mg at 10/14/15 1543   ??? heparin (porcine) injection 5,000 Units  5,000 Units SubCUTAneous Q8H 5,000 Units at 10/15/15 2205   ??? lactobac ac& pc-s.therm-b.anim (FLORA Q/RISAQUAD)  1 Cap Oral DAILY 1 Cap at 10/16/15 0842   ??? nicotine (NICODERM CQ) 21 mg/24 hr patch 1 Patch  1 Patch TransDERmal Q24H     ??? 0.9% sodium chloride infusion  100 mL/hr IntraVENous DIALYSIS PRN     ??? albumin human 25% (BUMINATE) solution 12.5 g  12.5 g IntraVENous DIALYSIS PRN     ??? epoetin alfa (EPOGEN;PROCRIT) injection 10,000 Units  10,000 Units SubCUTAneous Q MON, WED & FRI Stopped at 10/15/15 1700       Labs/Data:    Lab Results   Component Value Date/Time    WBC 5.5 10/17/2015 08:49 AM    HGB 7.5 10/17/2015 08:49 AM     HCT 24.5 10/17/2015 08:49 AM    PLATELET 152 10/17/2015 08:49 AM    MCV 95.3 10/17/2015 08:49 AM       Lab Results   Component Value Date/Time    Sodium 137 10/17/2015 08:49 AM    Potassium 4.8 10/17/2015 08:49 AM    Chloride 102 10/17/2015 08:49 AM    CO2 21 10/17/2015 08:49 AM    Anion gap 14 10/17/2015 08:49 AM    Glucose 127 10/17/2015 08:49 AM    BUN 53 10/17/2015 08:49 AM    Creatinine 12.00 10/17/2015 08:49 AM    BUN/Creatinine ratio 4 10/17/2015 08:49 AM    GFR est AA 6 10/17/2015 08:49 AM    GFR est non-AA 5 10/17/2015 08:49 AM    Calcium 6.0 10/17/2015 08:49 AM       Wt Readings from Last 3 Encounters:   10/17/15 114.7 kg (252 lb 12.8 oz)       No intake or output data in the 24 hours ending 10/17/15 1354    Patient seen and examined. Chart reviewed. Labs, data and other pertinent notes reviewed in last 24 hrs.    PMH/SH/FH reviewed and unchanged compared to H&P    Discussed with pt and HD RN  Will d/w Dr. Renard HamperKalka      Dorianna Mckiver M Smayan Hackbart, MD

## 2015-10-17 NOTE — Other (Signed)
[facility]  [phone]    Vitals   Pre   Post   Assessment   Pre   Post     Temp  98.0  97.8   LOC  A & O x3 A & O x3   HR   94 98 Lungs   Coarse throughout  Coarse throughout   B/P   200/119 204/114 Cardiac   Regular  Regular   Resp   18 20 Skin   Warm, dry  Warm, dry   Pain level  0 0 Edema  Generalized   Generalized   Orders:    Duration:   Start:   0900 End:   0941 Total:   41 mins   Dialyzer:   Revaclear   K Bath:   2   Ca Bath:   2.5   Na/Bicarb:   140/35   Target Fluid Removal:   2.5 liters   Access     Type & Location:   Right femoral CVC; Left upper arm AVF   Labs     Obtained/Reviewed   Critical Results Called   Date when labs were drawn-  Hgb-    HGB   Date Value Ref Range Status   10/17/2015 7.5 (L) 12.1 - 17.0 g/dL Final     K-    Potassium   Date Value Ref Range Status   10/17/2015 4.8 3.5 - 5.1 mmol/L Final     Ca-   Calcium   Date Value Ref Range Status   10/17/2015 6.0 (LL) 8.5 - 10.1 MG/DL Final     Comment:     RESULTS VERIFIED, PHONED TO AND READ BACK BY  LASS RN @1031 /MA       Bun-   BUN   Date Value Ref Range Status   10/17/2015 53 (H) 6 - 20 MG/DL Final     Creat-   Creatinine   Date Value Ref Range Status   10/17/2015 12.00 (H) 0.70 - 1.30 MG/DL Final        Medications/ Blood Products Given     Name   Dose   Route and Time                     Blood Volume Processed (BVP):    10.9 liters Net Fluid   Removed:  96 ml   Comments   Time Out Done: yes 0845  Primary Nurse Rpt Pre: Doran HeaterLagina Peterson, RN  Primary Nurse Rpt Post: Doran HeaterLagina Peterson, RN  Pt Education: Proceduaral, access care, infection prevention  Care Plan: Follow up with nephrology  Tx Summary: Phoned Dr. Allena KatzPatel prior to treatment to notify him of pt's refusal to utilize AVF for treatment, and pt's request to use CVC. Educated patient on risk of infection with CVC and results of IR saying access was patent. Patel gave verbal clearance to allow CVC use. Initially  started treatment with CVC. CVC aspirated and flushed with ease. However, arterial pressures on the machine consistently stayed above 300. Pt agree to utilizing left AVF. AVF positive bruit and thrill. Cannulated with ease; aspirated and flushed with ease. Pt. Refused offer for blood pressure medications during dialysis. 0930 pt stated he did not feel well and wanted to come off the machine. Attempted to educate pt. Regarding his need for dialysis. Pt. Refused treatment. Physician notified. All possible blood rinsed back. Hemostasis achieved x 5min for arterial and venous lines. Primary nurse called to administer pain and blood  pressure medications.    Admiting Diagnosis:  Pt's previous clinic- n/a  Consent signed - Informed Consent Verified: Yes (10/15/15 1835)  [facility] Consent - yes   Hepatitis Status- negative 10/15/15  Machine #- Machine Number: [id number] (10/15/15 1835)  Telemetry status- monitored  Pre-dialysis wt.- Pre-Dialysis Weight: 116 kg (255 lb 11.7 oz) (10/15/15 1835)

## 2015-10-17 NOTE — Discharge Summary (Signed)
Discharge Summary       PATIENT ID: David [name]  MRN: [id number]   DATE OF BIRTH: March 29, 1971    DATE OF ADMISSION: 10/13/2015  9:49 PM    DATE OF DISCHARGE: 10/17/2015   PRIMARY CARE PROVIDER: None     ATTENDING PHYSICIAN: Lynden Ang, M.D.  DISCHARGING PROVIDER: Lynden Ang, MD    To contact this individual call [phone] and ask the operator to page.  If unavailable ask to be transferred the Adult Hospitalist Department.    CONSULTATIONS: IP CONSULT TO GASTROENTEROLOGY  IP CONSULT TO NEPHROLOGY  IP CONSULT TO HEPATOLOGY  IP CONSULT TO INFECTIOUS DISEASES    PROCEDURES/SURGERIES: Procedure(s):  FISTULAGRAM LEFT ARM WITH FLURO     ADMITTING DIAGNOSES & HOSPITAL COURSE:   This is a 45 year old man with a past medical history significant for end-stage renal disease on   hemodialysis, hypertension, recurrent C difficile, and asthma, who was in his   usual state of health until a week prior to admission, when he started having increased abdominal pain, distension and diarrhea.  Patient has a history of recurrent C. Diff.  He was admitted to [facility].  Per patient, the test for C. Diff was positive and he was started on treatment.  However, he did not like the service, so left the hospital and came to [facility] instead.   ??  The patient also complains of recurrent abdominal ascites since January this year.  He reports he had a liver biopsy in the past and he was told he has not liver disease.    ??  Patient reports two episodes of spontaneous bacterial peritonitis in the past.   ??  Diarrhea: resolved  - seems resolved, patient had a formed BM earlier today.   - sample not accepted to be tested for C. Diff;  - second BM loose, C. Diff negative.  - will attempt to get records/Discharge Summary from [facility].   - empirically on PO Vancomycin and IV Flagyl.  - 6/28: per GI, recommend a long Vancomycin taper in the setting of refractory C. Diff. ID following.  Currently on PO Flagyl, continue.   - 6/30: no more diarrhea.    ??   ESRD:  - HD dependent;   - Nephrology consulted: last dialysis on Friday, missed dialysis yesterday;  - 6/28: Dialysis was attempted using the Left arm AV fistula, but not completed because of pain/ no flow.  Quinton catheter was placed in right groin this afternoon.  - Plan per Nephrology: daily HD x3.  - Fistulogram 6/29: no stenosis.   - 6/30: Dialysis yesterday and about 1 h today, interrupted due to increased SOB.  Per report, the flow was also slow through the [place].   - Fluid restriction.   - Remove Quinton prior to discharge.    ??  HTN:  - uncontrolled;  - patient states that his BP drops with Dialysis, so we will monitor for now;   - resume home BP medications when able;   - patient has occasionally refused to take his BP medications.    ??  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   - patient can f/u with Hepatology as outpatient, but the impression is that the ascites is probably due  to lack of efficient fluid removal.  ??  Asthma:   - stable, no SOB;  ??  Anemia:  - most likely anemia of chronic disease.   - management per Nephrology.   ??  Nicotine addiction:  - patient was counseled on admission;   - declined Nicotine patch;   ??  Patient was discharged to home in stable condition.   He was urged to resume his usual dialysis schedule as outpatient.       DISCHARGE DIAGNOSES / PLAN:      ?? Diarrhea: resolved.  C. Diff negative.  Complete the course of PO Flagyl ([facility]    ?? ESRD:  Resume outpatient dialysis.  Left arm AV fistulogram showed no stenosis.   ??  pain/ no flow.  Quinton catheter was placed in right groin this afternoon.  Fluid restriction.   ?? HTN:  Uncontrolled. Poor compliance in the hospital.   ?? 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  Outpatient f/u with Hepatology, but suspect ascites is due to lack of efficient fluid removal during dialysis.??  ?? Asthma: stable, continue home medications.   ?? Anemia:  most likely anemia of chronic disease.  Management per Nephrology.   ?? Nicotine addiction: patient was counseled.      ??       PENDING TEST RESULTS:   At the time of discharge the following test results are still pending: None    FOLLOW UP APPOINTMENTS:    Follow-up Information     Follow up With Details Comments Contact Info    None   None ([phone]) Patient stated that they have no PCP      Your usual Dialysis center In 3 days           ADDITIONAL CARE RECOMMENDATIONS:   Please take all your medications as instructed.  It is very important that you take all your BP medications as prescribed.  High BP is associated with major morbidity and mortality, including stroke and heart attack.    Please go to your usual Dialysis session on Monday or as scheduled.   Please follow-up with your PCP or Nephrologist for refills of prescriptions and general f/u.     DIET: Renal Diet  Fluid restriction: 1500 ml (no more than 6 cups) per day    ACTIVITY: Activity as tolerated    WOUND CARE: N/A    EQUIPMENT needed: None      DISCHARGE MEDICATIONS:  Current Discharge Medication List      START taking these medications    Details   metroNIDAZOLE (FLAGYL) 500 mg tablet Take 1 Tab by mouth three (3) times daily for 10 days.  Qty: 30 Tab, Refills: 0         CONTINUE these medications which have NOT CHANGED    Details   sevelamer (RENAGEL) 400 mg tablet Take 800 mg by mouth three (3) times daily (with meals).      carvedilol (COREG) 12.5 mg tablet Take  by mouth two (2) times daily (with meals).      hydrALAZINE (APRESOLINE) 100 mg tablet Take 100 mg by mouth three (3) times daily.      amLODIPine (NORVASC) 10 mg tablet Take 10 mg by mouth daily.       pantoprazole (PROTONIX) 40 mg tablet Take 40 mg by mouth daily.      furosemide (LASIX) 80 mg tablet Take 80 mg by mouth daily.  albuterol (PROVENTIL HFA, VENTOLIN HFA, PROAIR HFA) 90 mcg/actuation inhaler Take 2 Puffs by inhalation every four (4) hours as needed for Wheezing.               NOTIFY YOUR PHYSICIAN FOR ANY OF THE FOLLOWING:   Fever over 101 degrees for 24 hours.   Chest pain, shortness of breath, fever, chills, nausea, vomiting, diarrhea, change in mentation, falling, weakness, bleeding. Severe pain or pain not relieved by medications.  Or, any other signs or symptoms that you may have questions about.    DISPOSITION:  x  Home With:   OT  PT  HH  RN       Long term SNF/Inpatient Rehab    Independent/assisted living    Hospice    Other:       PATIENT CONDITION AT DISCHARGE:     Functional status    Poor     Deconditioned    x Independent      Cognition    x Lucid     Forgetful     Dementia      Catheters/lines (plus indication)    Foley     PICC     PEG    x None      Code status    x Full code     DNR      PHYSICAL EXAMINATION AT DISCHARGE:     10/17/15 1530 98.1 ??F (36.7 ??C) 94 175/82 --     ????  Constitutional:  No acute distress, cooperative, pleasant??   ENT:  Oral mucous moist, oropharynx benign. Neck supple;,    Resp:  CTA bilaterally. No wheezing/rhonchi/rales. No accessory muscle use   CV:  Regular rhythm, normal rate, no murmurs, gallops, rubs    GI:  Soft, slightly distended, diffusely tender. normoactive bowel sounds;  Paracentesis site RLQ, no drainage or bleeding;     Musculoskeletal:  2+ edema, tense.    Neurologic:  Moves all extremities.  AAOx3, speech clear, memory intact;                                             Skin:  Good turgor, no rashes or ulcers          CHRONIC MEDICAL DIAGNOSES:  Problem List as of 10/17/2015  Date Reviewed: 10/16/2015          Codes Class Noted - Resolved    * (Principal)Gastroenteritis ICD-10-CM: K52.9  ICD-9-CM: 558.9  10/14/2015 - Present               Greater than 35 minutes were spent with the patient on counseling and coordination of care    Signed:   Lynden AngIlona E Osie Merkin, MD  10/17/2015  4:15 PM

## 2015-10-17 NOTE — Discharge Summary (Signed)
Discharge  Summary by Verner Mould E at 10/17/15 1614                Author: Verner Mould E  Service: Internal Medicine  Author Type: Physician       Filed: 10/26/15 2219  Date of Service: 10/17/15 1614  Status: Signed          Editor: Verner Mould E                       Discharge Summary           PATIENT ID: David [name]   MRN: [id number]     DATE OF BIRTH: October 10, 1970      DATE OF ADMISSION: 10/13/2015  9:49 PM      DATE OF DISCHARGE:  10/17/2015    PRIMARY CARE PROVIDER:  None       ATTENDING PHYSICIAN: Lynden Ang, M.D.   DISCHARGING PROVIDER:  Lynden Ang, MD     To contact this individual call [phone] and ask the operator to page.  If unavailable ask to be transferred the Adult Hospitalist Department.      CONSULTATIONS: IP CONSULT TO GASTROENTEROLOGY   IP CONSULT TO NEPHROLOGY   IP CONSULT TO HEPATOLOGY   IP CONSULT TO INFECTIOUS DISEASES      PROCEDURES/SURGERIES: Procedure(s):   FISTULAGRAM LEFT ARM WITH FLURO       ADMITTING DIAGNOSES & HOSPITAL  COURSE:    This is a 45 year old man with a past medical history significant for end-stage renal disease on    hemodialysis, hypertension, recurrent C difficile, and asthma, who was in his    usual state of health until a week prior to admission, when he started having increased abdominal pain, distension and diarrhea.  Patient has a history of recurrent C. Diff.  He was admitted to [facility].  Per patient, the test for C. Diff was positive  and he was started on treatment.  However, he did not like the service, so left the hospital and came to [facility] instead.    ??   The patient also complains of recurrent abdominal ascites since January this year.  He reports he had a liver biopsy in the past and he was told he has not liver disease.     ??   Patient reports two episodes of spontaneous bacterial peritonitis in the past.    ??   Diarrhea: resolved   - seems resolved, patient had a formed BM earlier today.    - sample not accepted to be tested for C.  Diff;   - second BM loose, C. Diff negative.   - will attempt to get records/Discharge Summary from [facility].    - empirically on PO Vancomycin and IV Flagyl.   - 6/28: per GI, recommend a long Vancomycin taper in the setting of refractory C. Diff. ID following.  Currently on PO Flagyl, continue.    - 6/30: no more diarrhea.     ??   ESRD:   - HD dependent;    - Nephrology consulted: last dialysis on Friday, missed dialysis yesterday;   - 6/28: Dialysis was attempted using the Left arm AV fistula, but not completed because of pain/ no flow.  Quinton catheter was placed in right groin this afternoon.   - Plan per Nephrology: daily HD x3.   - Fistulogram 6/29: no stenosis.    - 6/30: Dialysis yesterday and about 1 h today, interrupted due to  increased SOB.  Per report, the flow was also slow through the [place].    - Fluid restriction.    - Remove Quinton prior to discharge.     ??   HTN:   - uncontrolled;   - patient states that his BP drops with Dialysis, so we will monitor for now;    - resume home BP medications when able;    - patient has occasionally refused to take his BP medications.     ??   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    - patient can f/u with Hepatology as outpatient, but the impression is that the ascites is probably due to lack of efficient fluid removal.   ??   Asthma:    - stable, no SOB;   ??   Anemia:   - most likely anemia of chronic disease.    - management per Nephrology.    ??   Nicotine addiction:   - patient was counseled on admission;    - declined Nicotine patch;    ??   Patient was discharged to home in stable condition.    He was urged to resume his usual dialysis schedule as outpatient.            DISCHARGE DIAGNOSES / P LAN:         ??    Diarrhea:  resolved.  C. Diff negative.  Complete the course of PO Flagyl  (patient reported positive C. Diff at [facility]).     ??  ESRD:  Resume outpatient dialysis.  Left arm AV fistulogram showed no stenosis.    ??   pain/ no flow.  Quinton catheter was placed in right groin this afternoon.  Fluid restriction.    ??  HTN:  Uncontrolled. Poor compliance in the hospital.   ??  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  Outpatient f/u with Hepatology, but suspect ascites is due  to lack of efficient fluid removal during dialysis.??   ??  Asthma: stable, continue home medications.    ??  Anemia:  most likely anemia of chronic disease.  Management per Nephrology.    ??  Nicotine addiction: patient was counseled.        ??           PENDING TEST RESULTS:    At the time of discharge the following test results are still pending: None      FOLLOW UP APPOINTMENTS:       Follow-up Information        Follow up With  Details  Comments  Contact Info             None      None (395) Patient stated that they have no PCP                Your usual Dialysis center  In 3 days                     ADDITIONAL CARE RECOMMENDATIONS:     Please take all your medications as instructed.   It is very important that you take all your BP medications as prescribed.  High BP  is associated with major morbidity and mortality, including stroke and heart attack.     Please go to your usual Dialysis session on Monday or as scheduled.    Please follow-up with your PCP or Nephrologist for refills of prescriptions and general f/u.       DIET: Renal Diet   Fluid restriction: 1500 ml (no more than 6 cups) per day      ACTIVITY: Activity as tolerated      WOUND CARE: N/A      EQUIPMENT needed: None         DISCHARGE MEDICATIONS:     Current Discharge Medication List              START taking these medications          Details        metroNIDAZOLE (FLAGYL) 500 mg tablet  Take 1 Tab by mouth three (3) times daily for 10 days.   Qty: 30 Tab,  Refills:  0                     CONTINUE these medications which have NOT CHANGED          Details        sevelamer (RENAGEL) 400 mg tablet  Take 800 mg by mouth three (3) times daily (with meals).               carvedilol (COREG) 12.5 mg tablet  Take  by mouth two (2) times daily (with meals).               hydrALAZINE (APRESOLINE) 100 mg tablet  Take 100 mg by mouth three (3) times daily.               amLODIPine (NORVASC) 10 mg tablet  Take 10 mg by mouth daily.               pantoprazole (PROTONIX) 40 mg tablet  Take 40 mg by mouth daily.               furosemide (LASIX) 80 mg tablet  Take 80 mg by mouth daily.               albuterol (PROVENTIL HFA, VENTOLIN HFA, PROAIR HFA) 90 mcg/actuation inhaler  Take 2 Puffs by inhalation every four (4) hours as needed for Wheezing.                            NOTIFY YOUR PHYSICIAN FOR ANY OF THE FOLLOWING:    Fever over 101 degrees for 24 hours.    Chest pain, shortness of breath, fever, chills, nausea, vomiting, diarrhea, change in mentation, falling, weakness, bleeding. Severe pain or pain not relieved by medications.   Or, any other signs or symptoms that you may have questions about.      DISPOSITION:      x   Home With:     OT    PT    HH    RN                   Long term SNF/Inpatient Rehab       Independent/assisted living          Hospice          Other:           PATIENT CONDITION AT DISCHARGE:       Functional status  Poor        Deconditioned         x  Independent         Cognition        x  Lucid        Forgetful           Dementia         Catheters/lines (plus indication)         Foley        PICC        PEG         x  None         Code status        x  Full code           DNR         PHYSICAL EXAMINATION AT DISCHARGE:             10/17/15 1530  98.1 ??F (36.7 ??C)  94  175/82  --        ????      Constitutional:   No acute distress, cooperative, pleasant??     ENT:   Oral mucous moist, oropharynx benign. Neck supple;,      Resp:   CTA bilaterally. No  wheezing/rhonchi/rales. No accessory muscle use     CV:   Regular rhythm, normal rate, no murmurs, gallops, rubs      GI:   Soft, slightly distended, diffusely tender. normoactive bowel sounds;   Paracentesis site RLQ, no drainage or bleeding;       Musculoskeletal:   2+ edema, tense.      Neurologic:   Moves all extremities.  AAOx3, speech clear, memory intact;                                               Skin:  Good turgor, no rashes or ulcers               CHRONIC MEDICAL DIAGNOSES:      Problem List as of 10/17/2015   Date Reviewed:  10/16/2015                Codes  Class  Noted - Resolved             * (Principal)Gastroenteritis  ICD-10-CM: K52.9   ICD-9-CM: 558.9    10/14/2015 - Present                          Greater than 35 minutes were spent with the patient on counseling and coordination of care      Signed:    Lynden AngIlona E Blen Ransome, MD   10/17/2015   4:15 PM

## 2015-10-18 ENCOUNTER — Emergency Department: Payer: Medicare Other

## 2015-10-18 ENCOUNTER — Emergency Department
Admission: EM | Admit: 2015-10-18 | Discharge: 2015-10-19 | Disposition: A | Discharge status: Home / Self Care | Payer: Medicare Other | Attending: Emergency Medicine | Admitting: Emergency Medicine

## 2015-10-18 DIAGNOSIS — R197 Diarrhea, unspecified: Secondary | ICD-10-CM | POA: Insufficient documentation

## 2015-10-18 DIAGNOSIS — R188 Other ascites: Secondary | ICD-10-CM | POA: Insufficient documentation

## 2015-10-18 DIAGNOSIS — I10 Essential (primary) hypertension: Secondary | ICD-10-CM | POA: Insufficient documentation

## 2015-10-18 DIAGNOSIS — R7989 Other specified abnormal findings of blood chemistry: Secondary | ICD-10-CM | POA: Insufficient documentation

## 2015-10-18 DIAGNOSIS — R1084 Generalized abdominal pain: Secondary | ICD-10-CM | POA: Insufficient documentation

## 2015-10-18 DIAGNOSIS — J45909 Unspecified asthma, uncomplicated: Secondary | ICD-10-CM | POA: Insufficient documentation

## 2015-10-18 LAB — CULTURE, BODY FLUID W GRAM STAIN
Culture result:: NO GROWTH
GRAM STAIN: NONE SEEN

## 2015-10-18 LAB — CBC AND DIFFERENTIAL
Basophils %: 0.6 % (ref 0.0–3.0)
Basophils Absolute: 0 10*3/uL (ref 0.0–0.3)
Eosinophils %: 15.9 % — ABNORMAL HIGH (ref 0.0–7.0)
Eosinophils Absolute: 1 10*3/uL — ABNORMAL HIGH (ref 0.0–0.8)
Hematocrit: 25.6 % — ABNORMAL LOW (ref 39.0–52.5)
Hemoglobin: 7.9 gm/dL — ABNORMAL LOW (ref 13.0–17.5)
Lymphocytes Absolute: 0.8 10*3/uL (ref 0.6–5.1)
Lymphocytes: 12.4 % — ABNORMAL LOW (ref 15.0–46.0)
MCH: 30 pg (ref 28–35)
MCHC: 31 gm/dL — ABNORMAL LOW (ref 32–36)
MCV: 98 fL (ref 80–100)
MPV: 7.7 fL (ref 6.0–10.0)
Monocytes Absolute: 0.5 10*3/uL (ref 0.1–1.7)
Monocytes: 7.7 % (ref 3.0–15.0)
Neutrophils %: 63.4 % (ref 42.0–78.0)
Neutrophils Absolute: 4.1 10*3/uL (ref 1.7–8.6)
PLT CT: 157 10*3/uL (ref 130–440)
RBC: 2.6 10*6/uL — ABNORMAL LOW (ref 4.00–5.70)
RDW: 15.3 % — ABNORMAL HIGH (ref 11.0–14.0)
WBC: 6.4 10*3/uL (ref 4.0–11.0)

## 2015-10-18 NOTE — ED Provider Notes (Signed)
[facility] EMERGENCY DEPARTMENT History and Physical Exam      Patient Name: [name], [name]  Encounter Date:  10/18/2015  Attending Physician: Theodora Blow, MD  PCP: Christa See, MD  Patient DOB:  1970-05-13  MRN:  [id number]  Room:  N1/N1-A      History of Presenting Illness     Chief complaint: Abdominal Pain and Emesis    HPI/ROS is limited by: none  HPI/ROS given by: patient    Location: abdomen   Duration: 1 week  Severity: moderate    [name] is a 45 y.o. male who presents with abdominal pain. Patient also complains of nausea, vomiting, diarrhea, shortness of breath, and distention. The patient a dialysis patient with left arm fistula, MWF. He notes that he had a paracentesis 1 week ago, removing 4L. Patient is also in need of a fecal transplant but has not followed up. He has a history of noncompliance.     Review of Systems     Review of Systems   Constitutional: Negative for fever, chills, diaphoresis and fatigue.   HENT: Negative for congestion, rhinorrhea and sore throat.    Respiratory: Positive for shortness of breath. Negative for cough and chest tightness.    Cardiovascular: Negative for chest pain, palpitations and leg swelling.   Gastrointestinal: Positive for nausea, vomiting, abdominal pain, diarrhea and abdominal distention. Negative for constipation.   Genitourinary: Negative for dysuria, hematuria and difficulty urinating.   Musculoskeletal: Negative for myalgias, back pain and arthralgias.   Skin: Negative for rash and wound.   Neurological: Negative for dizziness, syncope, speech difficulty, weakness, light-headedness, numbness and headaches.   Psychiatric/Behavioral: Negative for suicidal ideas. The patient is not nervous/anxious.       Allergies     Pt is allergic to lisinopril; morphine; and toradol.    Medications     Current Outpatient Rx   Name  Route  Sig  Dispense  Refill   . albuterol (PROVENTIL) (2.5 MG/3ML) 0.083% nebulizer solution    Nebulization    Take 2.5 mg by  nebulization.             Marland Kitchen amLODIPine (NORVASC) 10 MG tablet    Oral    Take 10 mg by mouth daily. Unable to recall dose             . carvedilol (COREG) 25 MG tablet    Oral    Take 25 mg by mouth 2 (two) times daily.             . furosemide (LASIX) 80 MG tablet    Oral    Take 80 mg by mouth daily.             . hydrALAZINE (APRESOLINE) 100 MG tablet    Oral    Take 100 mg by mouth 3 (three) times daily.             . sevelamer (RENVELA) 800 MG tablet    Oral    Take 800 mg by mouth 3 (three) times daily with meals.             Marland Kitchen albuterol-ipratropium (COMBIVENT RESPIMAT) 20-100 MCG/ACT Aero Soln    Inhalation    Inhale 2 puffs into the lungs.                  Past Medical History     Pt has a past medical history of Hypertension; Kidney failure; Asthma without status asthmaticus; and Clostridium difficile diarrhea.  Past Surgical History     Pt has past surgical history that includes AV fistula placement.    Family History     The family history is not on file.    Social History     Pt reports that he has been smoking Cigarettes.  He has been smoking about 0.50 packs per day. He does not have any smokeless tobacco history on file. He reports that he does not drink alcohol or use illicit drugs.    Physical Exam     Blood pressure 173/78, pulse 97, temperature 98.7 F (37.1 C), temperature source Oral, resp. rate 20, height 1.854 m, weight 118.8 kg, SpO2 100 %.    Physical Exam   Constitutional: He is oriented to person, place, and time and well-developed, well-nourished, and in no distress. No distress.   HENT:   Head: Normocephalic and atraumatic.   Mouth/Throat: Oropharynx is clear and moist.   Eyes: Conjunctivae and EOM are normal. Pupils are equal, round, and reactive to light.   Neck: Normal range of motion. Neck supple.   Cardiovascular: Normal rate and regular rhythm.  Exam reveals no gallop and no friction rub.    No murmur heard.  Pulmonary/Chest: Effort normal and breath sounds normal. No  respiratory distress. He has no wheezes. He has no rales.   Abdominal: Soft. He exhibits distension. Bowel sounds are hyperactive and tinkling. There is no tenderness.   Musculoskeletal: Normal range of motion. He exhibits no edema or tenderness.   Lymphadenopathy:     He has no cervical adenopathy.   Neurological: He is alert and oriented to person, place, and time.   Skin: Skin is warm and dry. No rash noted. He is not diaphoretic.   Psychiatric: Affect normal.   Nursing note and vitals reviewed.     Orders Placed     Orders Placed This Encounter   Procedures   . XR Abdomen 2 View With CXR   . CBC and differential   . CMP   . Lipase   . Saline lock IV       Diagnostic Results       The results of the diagnostic studies below have been reviewed by myself:    Labs  Results     Procedure Component Value Units Date/Time    CMP [[id number]]  (Abnormal) Collected:  10/18/15 2259    Specimen Information:  Plasma Updated:  10/19/15 0125     Sodium 140 mMol/L      Potassium 4.8 mMol/L      Chloride 105 mMol/L      CO2 20.0 mMol/L      Calcium 6.4 (LL) mg/dL      Glucose 76 mg/dL      Creatinine 62.95 (H) mg/dL      BUN 59 (H) mg/dL      Protein, Total 5.2 (L) gm/dL      Albumin 2.7 (L) gm/dL      Alkaline Phosphatase 194 (H) U/L      ALT 10 U/L      AST (SGOT) 21 U/L      Bilirubin, Total 0.5 mg/dL      Albumin/Globulin Ratio 1.08 Ratio      Anion Gap 19.8 (H) mMol/L      BUN/Creatinine Ratio 4.5 (L) Ratio      EGFR 5 (L) mL/min/1.45m2      Osmolality Calc 295 mOsm/kg      Globulin 2.5 gm/dL     Lipase [[id number]] Collected:  10/18/15 2259    Specimen Information:  Plasma Updated:  10/19/15 0123     Lipase 51 U/L     CBC and differential [[id number]]  (Abnormal) Collected:  10/18/15 2259    Specimen Information:  Blood from Blood Updated:  10/18/15 2337     WBC 6.4 K/cmm      RBC 2.60 (L) M/cmm      Hemoglobin 7.9 (L) gm/dL      Hematocrit 40.9 (L) %      MCV 98 fL      MCH 30 pg      MCHC 31 (L) gm/dL      RDW 81.1 (H) %       PLT CT 157 K/cmm      MPV 7.7 fL      NEUTROPHIL % 63.4 %      Lymphocytes 12.4 (L) %      Monocytes 7.7 %      Eosinophils % 15.9 (H) %      Basophils % 0.6 %      Neutrophils Absolute 4.1 K/cmm      Lymphocytes Absolute 0.8 K/cmm      Monocytes Absolute 0.5 K/cmm      Eosinophils Absolute 1.0 (H) K/cmm      BASO Absolute 0.0 K/cmm           Radiologic Studies  Radiology Results (24 Hour)     Procedure Component Value Units Date/Time    XR Abdomen 2 View With CXR [[id number]] Collected:  10/19/15 0040    Order Status:  Completed Updated:  10/19/15 0043    Narrative:      Clinical History:  Reason For Exam: distention  The patient complains of right upper quadrant abdominal pain and abdominal distention increasing for 3 days.    Examination:  Frontal view of the chest with supine and erect views of the abdomen.    Comparison:  None available.    Findings:  Cardiomegaly. Pulmonary vascularity unremarkable. Right subclavian stent in place. Lungs clear.  No pleural effusions.    Obesity. Ventral hernia repair lower abdominal mesh in place. Bowel gas pattern is normal. Moderate colonic stool and gas burden. No obvious masses or worrisome calcifications.  No free gas or pneumatosis.    Bones and soft tissues unremarkable.      Impression:      No acute process.    ReadingStation:[id number]          EKG: none    ED Course & Treatment          MDM / Critical Care     Blood pressure 173/78, pulse 97, temperature 98.7 F (37.1 C), temperature source Oral, resp. rate 20, height 1.854 m, weight 118.8 kg, SpO2 100 %.    The patient presents with abdominal pain without signs of peritonitis or other life-threatening or serious etiology. Many etiologies of the pain were considered such as appendicitis, bowel obstruction, and thought unlikely based on evaluation and presentation.  However the patient was given precautions and instructions to return if symptoms worsen or change in any way, or in 8-12 hours if not improved for  re-evaluation.  Follow-up with the patient's primary care physician was encouraged.  Patient was well-appearing on serial reevaluation at time of disposition and given abdominal pain precautions.  Diagnostic impression and plan were discussed with the patient and/or family.  Results of lab/radiology tests were discussed with the patient and/or family. All questions were answered and concerns addressed.  This chart was generated by an EMR and may contain errors or omissions not intended by the user.    Procedures     None    Diagnosis / Disposition     Clinical Impression  1. Generalized abdominal pain    2. Diarrhea, unspecified type    3. Other ascites        Disposition  ED Disposition     Discharge [name] discharge to home/self care.    Condition at disposition: Stable            Prescriptions  Discharge Medication List as of 10/19/2015  1:53 AM          The documentation recorded by my scribe, Darvin Neighbours, accurately reflects the services I personally performed and the decisions made by me. Theodora Blow MD.            Theodora Blow, MD  11/06/15 [phone]

## 2015-10-18 NOTE — ED Notes (Signed)
Patient c/o abdominal pain, SOB, distention and legs swelling. Patient is dialysis patient with left arm fistula, M/W/F. Patient has abdomen tapped approx 1 week ago, 4L removed.

## 2015-10-19 ENCOUNTER — Emergency Department: Payer: Medicare Other

## 2015-10-19 LAB — CULTURE, BLOOD, PAIRED: Culture result:: NO GROWTH

## 2015-10-19 LAB — COMPREHENSIVE METABOLIC PANEL
ALT: 10 U/L (ref 0–55)
AST (SGOT): 21 U/L (ref 10–42)
Albumin/Globulin Ratio: 1.08 Ratio (ref 0.70–1.50)
Albumin: 2.7 gm/dL — ABNORMAL LOW (ref 3.5–5.0)
Alkaline Phosphatase: 194 U/L — ABNORMAL HIGH (ref 40–145)
Anion Gap: 19.8 mMol/L — ABNORMAL HIGH (ref 7.0–18.0)
BUN / Creatinine Ratio: 4.5 Ratio — ABNORMAL LOW (ref 10.0–30.0)
BUN: 59 mg/dL — ABNORMAL HIGH (ref 7–22)
Bilirubin, Total: 0.5 mg/dL (ref 0.1–1.2)
CO2: 20 mMol/L (ref 20.0–30.0)
Calcium: 6.4 mg/dL — CL (ref 8.5–10.5)
Chloride: 105 mMol/L (ref 98–110)
Creatinine: 13.13 mg/dL — ABNORMAL HIGH (ref 0.80–1.30)
EGFR: 5 mL/min/{1.73_m2} — ABNORMAL LOW (ref 60–150)
Globulin: 2.5 gm/dL (ref 2.0–4.0)
Glucose: 76 mg/dL (ref 70–99)
Osmolality Calc: 295 mOsm/kg (ref 275–300)
Potassium: 4.8 mMol/L (ref 3.5–5.3)
Protein, Total: 5.2 gm/dL — ABNORMAL LOW (ref 6.0–8.3)
Sodium: 140 mMol/L (ref 136–147)

## 2015-10-19 LAB — LIPASE: Lipase: 51 U/L (ref 8–78)

## 2015-10-19 MED ORDER — ONDANSETRON HCL 4 MG/2ML IJ SOLN
INTRAMUSCULAR | Status: AC
Start: 2015-10-19 — End: ?
  Filled 2015-10-19: qty 2

## 2015-10-19 MED ORDER — ONDANSETRON HCL 4 MG/2ML IJ SOLN
4.0000 mg | Freq: Once | INTRAMUSCULAR | Status: AC
Start: 2015-10-19 — End: 2015-10-19
  Administered 2015-10-19: 4 mg via INTRAVENOUS

## 2015-10-19 NOTE — ED Notes (Signed)
Patient has repeatedly asked about pain medication since arrival pain 10/10. Patient now complaining of nausea. RN received verbal order for Zofran. RN returned to room with Zofran, Patient asleep.

## 2015-10-19 NOTE — Discharge Instructions (Signed)
Ascites    Ascites is fluid collecting in the abdomen (stomach area).Symptoms include swelling of the abdomen and a feeling of pressure. Shortness of breath may also occur. In severe cases, the feet, ankles and legs may also swell.  There are many causes of ascites. The most common are related to the liver. They include:   Long-term alcohol abuse   Hepatitis   Diseases such as congestive heart failure, kidney failure, pancreatitis, or cancer  To treat the condition, a low-salt diet may be recommended. Medicines that help fluid leave the body (diuretics) may be prescribed. In some cases, a procedure is done to drain the abdomen of fluid. This is called paracentesis. Unless the underlying cause is treated, the fluid is likely to return.  If liver damage is due to alcohol, stopping all alcohol will help slow the progress of the disease. If liver damage is from hepatitis B or C, treatments may be given to fight the virus. If liver damage becomes life threatening, a liver transplant may be needed.  Home care   Certain medicines can worsen liver damage. Talk to your healthcare provider or pharmacist about any medicines you currently take. Ask your healthcare provider or pharmacist before taking any new medicines. Also ask before taking herbs, vitamins, or minerals. Certain ones affect the liver.   Do not taking acetaminophen or ibuprofen without taking to your healthcare provider first.Both can affect your liver.   Stop all alcohol use. If you abuse alcohol, talk to your healthcare provider about getting help and support to stop.   If you use IV drugs, seek help to stop. Never share needles or other equipment.  Follow-up care  Follow up with your healthcare provider as advised.If a culture was done, call as directed for the results. Depending on the results, your treatment may change.  The following sources can tell you more about ascites and help you find support.   American [employer] Foundation  [phone] www.liverfoundation.org   Hepatitis Foundation Internationalwww.hepfi.org   Alcoholics Anonymouswww.CustomizedRugs.fi   [employer] on Alcoholism and Drug Dependence [phone] www.ncadd.org  When to seek medical advice  Call your healthcare provider right away if you have any of the following:   Sudden weight gain with increased size of your abdomen or leg swelling   Increasing jaundice (yellowing of skin or eyes)   Excess bleeding from cuts or injuries   Blood in vomit or stool (black or red color)   Trouble breathing   Increasing abdominal pain   Fever of 100.43F (38C) or higher, or as directed by your healthcare provider  Date Last Reviewed: 10/02/2013   2000-2016 [employer] [address], [place], [place] [address]. All rights reserved. This information is not intended as a substitute for professional medical care. Always follow your healthcare professional's instructions.        Hemodialysis    Hemodialysis is a type of treatment for kidney failure (also called end-stage kidney disease or ESRD).It uses a machine that holds a filter called a dialyzer. As blood flows through the dialyzer, waste is removed and fluid and chemicals are balanced. Hemodialysis treatments are usually done at a special dialysis center. In some cases, treatments may be done at home. As the kidney failure is getting worse, your doctor may advise you to have an access placed by a surgeon into one of your arms ahead of time. This access may take several weeks to mature before it can be used for hemodialysis.  How hemodialysis is  done  Two needles are inserted into a blood vessel (called an arteriovenous fistula or AV fistula) or arteriovenous graft (or AV graft), usually in your arm. Each needle is attached to a tube. One tube carries your blood into the dialyzer, where it is cleaned. Clean blood returns to your body through a second tube and needle. If this treatment has to be done as an emergency, a  plastic tube (caltheter) is inserted into a large vein, typically in the neck or groin. This catheter helps carry blood to and from the dialysis machine.  Your experience   Hemodialysis usually takes about 3 to 5 hours. It is usually done 3 times a week.   You'll have a regular schedule for your hemodialysis. Many centers have evening and weekend hours as well as weekday hours to help you continue working.   A trained nurse or technician connects you to the dialysis machine. He or she watches for problems and makes sure you are comfortable.   During treatment, only a small amount of blood (about 1 cup) is out of your body at any one time.   During or after your first few treatments, you may have a headache, muscle cramps, or feel nauseated. These should decrease as your body gets used to the treatments.   Some people are able to learn to use a dialysis at home.  Problems to watch for  Call your nurse or dialysis technician if you have any of these symptoms during or after treatment:   Chest pain   Bleeding from the needle site   Shortness of breath   Fever or chills   Headache or lightheadedness   Nausea or vomiting   Itching   Muscle cramps   Pain, warm or redness at your access site   Inability to feel yoor fistulaur blood flow and pulse at your AV graft   Date Last Reviewed: 03/19/2013   2000-2016 [employer] [address], [place], [place] [address]. All rights reserved. This information is not intended as a substitute for professional medical care. Always follow your healthcare professional's instructions.

## 2015-10-19 NOTE — Special Discharge Instructions (Signed)
WE HAVE REVIEWED LABORATORY VALUES OBTAINED HERE TODAY AND YESTERDAY AT [facility]  WE HAVE ALSO REVIEWED EXTENSIVE RECORDS FROM YOUR RECENT 4 DAY ADMISSION TO [facility] IN [place], [place]. THESE RECORDS SHOW A COMPLETE AND EXTENSIVE WORKUP THAT DEMONSTRATES YOUR CURRENT COMPLAINTS OF ABDOMINAL PAIN, ASCITES AND LIKELY PARTIAL CAUSE OF YOUR DIARRHEA IS RELATED TO NONCOMPLIANCE WITH HEMODIALYSIS. 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 WE ARE NOT SURE OF THE CAUSE OF YOUR DIARRHEA BUT TEST AT [facility] RULED OUT MULTIPLE DIFFERENT CAUSES AND IT MAY CURRENTLY BE RELATED TO RECENT TREATMENT WITH ANTIBIOTICS. IN ANY EVENT, IT IS CURRENTLY BEST TREATED NOW WITH OVER-THE-COUNTER IMODIUM WHICH WE RECOMMEND THAT YOU TAKE LIBERALLY PER PACKAGE INSTRUCTIONS.    IT IS ESSENTIAL THAT YOU FOLLOW UP WITH YOUR ESTABLISHED DIALYSIS APPOINTMENTS IN [facility]. IF YOU CHOOSE TO TRAVEL, THE DIALYSIS PROVIDERS CAN EASILY MAKE APPROPRIATE ARRANGEMENTS FOR YOU TO HAVE DIALYSIS IN DIFFERENT LOCATIONS. HOWEVER, THIS ONLY IS POSSIBLE IF YOU WORK WITH THE PROVIDERS TO HAVE DIALYSIS ON A REGULAR BASIS.    YOUR CURRENT LABORATORY VALUES AND OTHER TESTING DID NOT SHOW ANY NEED FOR FURTHER TREATMENT ON AN EMERGENCY BASIS. WE URGE YOU TO FOLLOW UP ON A REGULAR BASIS WITH YOUR ESTABLISHED CAREGIVERS.

## 2015-11-09 ENCOUNTER — Ambulatory Visit: Payer: Self-pay

## 2015-11-09 ENCOUNTER — Inpatient Hospital Stay
Admission: AD | Admit: 2015-11-09 | Discharge: 2015-11-12 | DRG: 393 | Discharge status: Against Medical Advice | Payer: Medicare Other | Source: Other Acute Inpatient Hospital | Attending: Internal Medicine | Admitting: Internal Medicine

## 2015-11-09 ENCOUNTER — Inpatient Hospital Stay: Payer: Medicare Other | Admitting: Internal Medicine

## 2015-11-09 DIAGNOSIS — F17210 Nicotine dependence, cigarettes, uncomplicated: Secondary | ICD-10-CM | POA: Diagnosis present

## 2015-11-09 DIAGNOSIS — R630 Anorexia: Secondary | ICD-10-CM | POA: Diagnosis present

## 2015-11-09 DIAGNOSIS — J45909 Unspecified asthma, uncomplicated: Secondary | ICD-10-CM | POA: Diagnosis present

## 2015-11-09 DIAGNOSIS — R188 Other ascites: Secondary | ICD-10-CM | POA: Diagnosis present

## 2015-11-09 DIAGNOSIS — I5022 Chronic systolic (congestive) heart failure: Secondary | ICD-10-CM | POA: Diagnosis present

## 2015-11-09 DIAGNOSIS — R112 Nausea with vomiting, unspecified: Secondary | ICD-10-CM | POA: Diagnosis present

## 2015-11-09 DIAGNOSIS — I340 Nonrheumatic mitral (valve) insufficiency: Secondary | ICD-10-CM | POA: Diagnosis present

## 2015-11-09 DIAGNOSIS — Z992 Dependence on renal dialysis: Secondary | ICD-10-CM

## 2015-11-09 DIAGNOSIS — I129 Hypertensive chronic kidney disease with stage 1 through stage 4 chronic kidney disease, or unspecified chronic kidney disease: Secondary | ICD-10-CM | POA: Diagnosis present

## 2015-11-09 DIAGNOSIS — Z765 Malingerer [conscious simulation]: Secondary | ICD-10-CM

## 2015-11-09 DIAGNOSIS — I252 Old myocardial infarction: Secondary | ICD-10-CM

## 2015-11-09 DIAGNOSIS — K661 Hemoperitoneum: Principal | ICD-10-CM | POA: Diagnosis present

## 2015-11-09 DIAGNOSIS — R197 Diarrhea, unspecified: Secondary | ICD-10-CM | POA: Diagnosis present

## 2015-11-09 DIAGNOSIS — I272 Other secondary pulmonary hypertension: Secondary | ICD-10-CM | POA: Diagnosis present

## 2015-11-09 DIAGNOSIS — N186 End stage renal disease: Secondary | ICD-10-CM | POA: Diagnosis present

## 2015-11-09 DIAGNOSIS — I2510 Atherosclerotic heart disease of native coronary artery without angina pectoris: Secondary | ICD-10-CM | POA: Diagnosis present

## 2015-11-09 DIAGNOSIS — D631 Anemia in chronic kidney disease: Secondary | ICD-10-CM | POA: Diagnosis present

## 2015-11-09 DIAGNOSIS — D62 Acute posthemorrhagic anemia: Secondary | ICD-10-CM | POA: Diagnosis present

## 2015-11-09 DIAGNOSIS — A09 Infectious gastroenteritis and colitis, unspecified: Secondary | ICD-10-CM | POA: Diagnosis present

## 2015-11-09 DIAGNOSIS — Z9861 Coronary angioplasty status: Secondary | ICD-10-CM

## 2015-11-09 DIAGNOSIS — Z9119 Patient's noncompliance with other medical treatment and regimen: Secondary | ICD-10-CM

## 2015-11-09 LAB — PT/INR
PT INR: 1.3 — ABNORMAL HIGH (ref 0.9–1.1)
PT: 16.2 s — ABNORMAL HIGH (ref 12.6–15.0)

## 2015-11-09 LAB — LACTIC ACID, PLASMA: Lactic Acid: 0.6 mmol/L (ref 0.2–2.0)

## 2015-11-09 MED ORDER — CARVEDILOL 6.25 MG PO TABS
25.0000 mg | ORAL_TABLET | Freq: Two times a day (BID) | ORAL | Status: DC
Start: 2015-11-10 — End: 2015-11-12
  Administered 2015-11-10 – 2015-11-11 (×3): 25 mg via ORAL
  Filled 2015-11-09 (×5): qty 4

## 2015-11-09 MED ORDER — HYDRALAZINE HCL 20 MG/ML IJ SOLN
10.0000 mg | Freq: Four times a day (QID) | INTRAMUSCULAR | Status: DC | PRN
Start: 2015-11-09 — End: 2015-11-12
  Administered 2015-11-10: 10 mg via INTRAVENOUS
  Filled 2015-11-09 (×2): qty 1

## 2015-11-09 MED ORDER — VANCOMYCIN ORAL SOLUTION 50 MG/ML UNIT DOSE
250.0000 mg | Freq: Four times a day (QID) | ORAL | Status: DC
Start: 2015-11-10 — End: 2015-11-12
  Administered 2015-11-10 – 2015-11-12 (×11): 250 mg via ORAL
  Filled 2015-11-09 (×14): qty 5

## 2015-11-09 MED ORDER — ONDANSETRON HCL 4 MG/2ML IJ SOLN
4.0000 mg | Freq: Once | INTRAMUSCULAR | Status: AC
Start: 2015-11-09 — End: 2015-11-09
  Administered 2015-11-09: 4 mg via INTRAVENOUS
  Filled 2015-11-09: qty 2

## 2015-11-09 MED ORDER — AMLODIPINE BESYLATE 5 MG PO TABS
10.0000 mg | ORAL_TABLET | Freq: Every day | ORAL | Status: DC
Start: 2015-11-10 — End: 2015-11-12
  Administered 2015-11-11: 10 mg via ORAL
  Filled 2015-11-09 (×2): qty 2

## 2015-11-09 MED ORDER — ALBUTEROL SULFATE (2.5 MG/3ML) 0.083% IN NEBU
2.5000 mg | INHALATION_SOLUTION | Freq: Four times a day (QID) | RESPIRATORY_TRACT | Status: DC | PRN
Start: 2015-11-09 — End: 2015-11-12

## 2015-11-09 MED ORDER — SEVELAMER CARBONATE 800 MG PO TABS
1600.0000 mg | ORAL_TABLET | Freq: Three times a day (TID) | ORAL | Status: DC
Start: 2015-11-10 — End: 2015-11-12
  Administered 2015-11-10 – 2015-11-12 (×5): 1600 mg via ORAL
  Filled 2015-11-09 (×7): qty 2

## 2015-11-09 MED ORDER — HYDROMORPHONE HCL 1 MG/ML IJ SOLN
1.0000 mg | INTRAMUSCULAR | Status: DC | PRN
Start: 2015-11-09 — End: 2015-11-09
  Administered 2015-11-09: 1 mg via INTRAVENOUS
  Filled 2015-11-09: qty 1

## 2015-11-09 MED ORDER — HYDROMORPHONE HCL 1 MG/ML IJ SOLN
1.0000 mg | Freq: Once | INTRAMUSCULAR | Status: AC
Start: 2015-11-09 — End: 2015-11-09
  Administered 2015-11-09: 1 mg via INTRAVENOUS
  Filled 2015-11-09: qty 1

## 2015-11-09 MED ORDER — HYDROMORPHONE HCL 1 MG/ML IJ SOLN
1.0000 mg | Freq: Four times a day (QID) | INTRAMUSCULAR | Status: DC | PRN
Start: 2015-11-10 — End: 2015-11-10
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
## 2015-11-09 NOTE — Progress Notes (Signed)
Pt arrived from [facility] hospital via EMS on monitor. Pt identification verified. Pt is A&Ox4. History and medication reconciliation obtained.Pt placed on monitor and CHG bath administered (pt refused to remove shorts). Report given to oncoming nurse.
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
## 2015-11-10 ENCOUNTER — Inpatient Hospital Stay: Payer: Medicare Other

## 2015-11-10 DIAGNOSIS — R188 Other ascites: Secondary | ICD-10-CM

## 2015-11-10 DIAGNOSIS — A09 Infectious gastroenteritis and colitis, unspecified: Secondary | ICD-10-CM

## 2015-11-10 LAB — CBC AND DIFFERENTIAL
Absolute NRBC: 0 10*3/uL
Basophils Absolute Automated: 0.04 10*3/uL (ref 0.00–0.20)
Basophils Automated: 0.6 %
Eosinophils Absolute Automated: 0.93 10*3/uL — ABNORMAL HIGH (ref 0.00–0.70)
Eosinophils Automated: 14.6 %
Hematocrit: 26.5 % — ABNORMAL LOW (ref 42.0–52.0)
Hgb: 8.4 g/dL — ABNORMAL LOW (ref 13.0–17.0)
Immature Granulocytes Absolute: 0.02 10*3/uL
Immature Granulocytes: 0.3 %
Lymphocytes Absolute Automated: 0.36 10*3/uL — ABNORMAL LOW (ref 0.50–4.40)
Lymphocytes Automated: 5.6 %
MCH: 30.1 pg (ref 28.0–32.0)
MCHC: 31.7 g/dL — ABNORMAL LOW (ref 32.0–36.0)
MCV: 95 fL (ref 80.0–100.0)
MPV: 9.7 fL (ref 9.4–12.3)
Monocytes Absolute Automated: 0.57 10*3/uL (ref 0.00–1.20)
Monocytes: 8.9 %
Neutrophils Absolute: 4.46 10*3/uL (ref 1.80–8.10)
Neutrophils: 70 %
Nucleated RBC: 0 /100 WBC (ref 0.0–1.0)
Platelets: 163 10*3/uL (ref 140–400)
RBC: 2.79 10*6/uL — ABNORMAL LOW (ref 4.70–6.00)
RDW: 16 % — ABNORMAL HIGH (ref 12–15)
WBC: 6.3 10*3/uL (ref 3.50–10.80)

## 2015-11-10 LAB — TYPE AND SCREEN
AB Screen Gel: NEGATIVE
AB Screen Gel: NEGATIVE
ABO Rh: O POS
ABO Rh: O POS

## 2015-11-10 LAB — CBC
Absolute NRBC: 0 10*3/uL
Hematocrit: 26.4 % — ABNORMAL LOW (ref 42.0–52.0)
Hgb: 8.3 g/dL — ABNORMAL LOW (ref 13.0–17.0)
MCH: 30 pg (ref 28.0–32.0)
MCHC: 31.4 g/dL — ABNORMAL LOW (ref 32.0–36.0)
MCV: 95.3 fL (ref 80.0–100.0)
MPV: 10.1 fL (ref 9.4–12.3)
Nucleated RBC: 0 /100 WBC (ref 0.0–1.0)
Platelets: 171 10*3/uL (ref 140–400)
RBC: 2.77 10*6/uL — ABNORMAL LOW (ref 4.70–6.00)
RDW: 16 % — ABNORMAL HIGH (ref 12–15)
WBC: 6.75 10*3/uL (ref 3.50–10.80)

## 2015-11-10 LAB — BODY FLUID CELL COUNT
Body Fluid Lymphocytes: 8 %
Body Fluid Monocyte/Macrophage Cells: 56 %
Body Fluid Polymorphonuclear Cell: 31 %
Body Fluid RBC: 302000 /mm3
Body Fluid WBC: 1308 /mm3

## 2015-11-10 LAB — HEMOLYSIS INDEX: Hemolysis Index: 198 — ABNORMAL HIGH (ref 0–18)

## 2015-11-10 LAB — BASIC METABOLIC PANEL
BUN: 37 mg/dL — ABNORMAL HIGH (ref 9.0–28.0)
CO2: 20 mEq/L — ABNORMAL LOW (ref 22–29)
Calcium: 7.4 mg/dL — ABNORMAL LOW (ref 8.5–10.5)
Chloride: 106 mEq/L (ref 100–111)
Creatinine: 9.1 mg/dL — ABNORMAL HIGH (ref 0.7–1.3)
Glucose: 71 mg/dL (ref 70–100)
Potassium: 5.1 mEq/L (ref 3.5–5.1)
Sodium: 139 mEq/L (ref 136–145)

## 2015-11-10 LAB — PT/INR
PT INR: 1.4 — ABNORMAL HIGH (ref 0.9–1.1)
PT INR: 1.4 — ABNORMAL HIGH (ref 0.9–1.1)
PT INR: 1.3 — ABNORMAL HIGH (ref 0.9–1.1)
PT: 16.7 s — ABNORMAL HIGH (ref 12.6–15.0)
PT: 17.6 s — ABNORMAL HIGH (ref 12.6–15.0)
PT: 15.8 s — ABNORMAL HIGH (ref 12.6–15.0)

## 2015-11-10 LAB — CLOSTRIDIUM DIFFICILE TOXIN B PCR: Stool Clostridium difficile Toxin B PCR: NEGATIVE

## 2015-11-10 LAB — HEPATITIS B SURFACE ANTIGEN W/ REFLEX TO CONFIRMATION: Hepatitis B Surface Antigen: NONREACTIVE

## 2015-11-10 LAB — HEPATITIS C ANTIBODY: Hepatitis C, AB: NONREACTIVE

## 2015-11-10 LAB — HEMOGLOBIN AND HEMATOCRIT, BLOOD
Hematocrit: 25.5 % — ABNORMAL LOW (ref 42.0–52.0)
Hematocrit: 26.1 % — ABNORMAL LOW (ref 42.0–52.0)
Hgb: 8 g/dL — ABNORMAL LOW (ref 13.0–17.0)
Hgb: 8.3 g/dL — ABNORMAL LOW (ref 13.0–17.0)

## 2015-11-10 LAB — APTT: PTT: 40 s — ABNORMAL HIGH (ref 23–37)

## 2015-11-10 LAB — GFR: EGFR: 7.6

## 2015-11-10 SURGERY — PARACENTESIS
Site: Abdomen | Laterality: Right

## 2015-11-10 MED ORDER — SODIUM CHLORIDE 0.9 % IV MBP
2.2500 g | Freq: Three times a day (TID) | INTRAVENOUS | Status: DC
Start: 2015-11-10 — End: 2015-11-12
  Administered 2015-11-10 – 2015-11-12 (×8): 2.25 g via INTRAVENOUS
  Filled 2015-11-10 (×9): qty 2.25

## 2015-11-10 MED ORDER — ONDANSETRON HCL 4 MG/2ML IJ SOLN
4.0000 mg | Freq: Four times a day (QID) | INTRAMUSCULAR | Status: DC | PRN
Start: 2015-11-10 — End: 2015-11-12
  Administered 2015-11-10 – 2015-11-12 (×4): 4 mg via INTRAVENOUS
  Filled 2015-11-10 (×5): qty 2

## 2015-11-10 MED ORDER — PIPERACILLIN SOD-TAZOBACTAM SO 2.25 (2-0.25) G IV SOLR
INTRAVENOUS | Status: AC
Start: 2015-11-10 — End: 2015-11-10
  Filled 2015-11-10: qty 2.25

## 2015-11-10 MED ORDER — HYDROMORPHONE HCL 1 MG/ML IJ SOLN
1.0000 mg | INTRAMUSCULAR | Status: DC | PRN
Start: 2015-11-10 — End: 2015-11-12
  Administered 2015-11-10 – 2015-11-12 (×21): 1 mg via INTRAVENOUS
  Filled 2015-11-10 (×21): qty 1

## 2015-11-10 MED ORDER — ALBUMIN HUMAN 25 % IV SOLN
100.0000 mL | INTRAVENOUS | Status: AC | PRN
Start: 2015-11-10 — End: 2015-11-10

## 2015-11-10 MED ORDER — SODIUM CHLORIDE 0.9 % IV BOLUS
250.0000 mL | INTRAVENOUS | Status: AC | PRN
Start: 2015-11-10 — End: 2015-11-10

## 2015-11-10 MED ORDER — SODIUM CHLORIDE 0.9 % IV BOLUS
100.0000 mL | INTRAVENOUS | Status: AC | PRN
Start: 2015-11-10 — End: 2015-11-10

## 2015-11-10 MED ORDER — HYDROMORPHONE HCL 2 MG PO TABS
4.0000 mg | ORAL_TABLET | ORAL | Status: DC | PRN
Start: 2015-11-10 — End: 2015-11-12
  Filled 2015-11-10: qty 2

## 2015-11-10 MED ORDER — HYDROMORPHONE HCL 1 MG/ML IJ SOLN
INTRAMUSCULAR | Status: AC
Start: 2015-11-10 — End: 2015-11-10
  Administered 2015-11-10: 1 mg via INTRAVENOUS
  Filled 2015-11-10: qty 1

## 2015-11-10 MED ORDER — SODIUM CHLORIDE 0.9 % IV MBP
2.2500 g | Freq: Four times a day (QID) | INTRAVENOUS | Status: DC
Start: 2015-11-10 — End: 2015-11-10

## 2015-11-10 NOTE — UM Notes (Signed)
FACILITY TAX ID : [id number]  ADMIT DATE: 11/09/2015  TODAY'S DATE: 11/10/2015   TYPE OF REVIEW:  INITIAL     STATUS :  INPT     45 y.o. male who presents to the hospital with abdominal pain, diarrhea, emesis. History of ESRD with recent placement of PD catheter at [facility] on 10/31/15, complicated by hemoperitoneum needing blood transfusion with subsequent stabilization.     On diallysis for approximately the last 7 years, with recent development of ascites in November 2016 requiring frequent paracentesis by IR. Work up of the ascites at OSH has resulted in though that it is most likely secondary to incomplete hemodialysis. W    After his hospitalization at the OSH for the ascites and PD cath placement, he left AMA when his dilaudid regimen was decreased. He then presented to  [facility] yesterday with diarrhea and abdominal pain, with concern of continued bloody drainage form his PD catheter, and he was transferred here per these symptoms.       Peritoneal fluid [R18.8]  Ascites of liver [R18.8]  Hemoperitoneum [K66.1]      Dialysis MWF       VITAL SIGNS:   Body mass index is 32.26 kg/(m^2).  Patient Vitals for the past 12 hrs:   BP Temp Pulse Resp   11/10/15 1000 (!) 177/101 mmHg - 96 (!) 27   11/10/15 0800 (!) 163/91 mmHg 98.8 F (37.1 C) 97 19   11/10/15 0700 159/88 mmHg - 98 20   11/10/15 0600 158/84 mmHg - 97 17   11/10/15 0500 (!) 154/95 mmHg - 96 18   11/10/15 0400 (!) 172/92 mmHg 98.9 F (37.2 C) 95 (!) 28   11/10/15 0300 (!) 149/92 mmHg - 93 19   11/10/15 0200 (!) 146/93 mmHg - 93 19   11/10/15 0100 156/88 mmHg - (!) 101 16         ED MEDS:     MEDS GIVEN ON UNIT :   Scheduled Meds:  Current Facility-Administered Medications   Medication Dose Route Frequency   . amLODIPine  10 mg Oral Daily   . carvedilol  25 mg Oral Q12H SCH   . piperacillin-tazobactam       . piperacillin-tazobactam  2.25 g Intravenous Q8H SCH   . sevelamer  1,600 mg Oral TID MEALS   . vancomycin  250 mg Oral 4 times per day      Continuous Infusions:   PRN Meds:.albuterol, hydrALAZINE, HYDROmorphone, HYDROmorphone, ondansetron      LABS:   Lab Results   Component Value Date    WBC 6.75 11/10/2015    HGB 8.3* 11/10/2015    HCT 26.4* 11/10/2015    MCV 95.3 11/10/2015    PLT 171 11/10/2015       Creatinine 9.1 , CO2 20 .     PLAN or DISPOSITION:   ESRD on HD; has clotted AVF for several weeks and had been getting HD through a left sided TDC  -HD today;   -surgical evaluation   -send peritoneal fluid for cell count and cultures   -check iron stores   -ESA  -daily labs to include phos and Mag    West Carbo RN, BSN   Utilization Review RN  [facility]   [phone]

## 2015-11-10 NOTE — Progress Notes (Signed)
[facility] [employer].  Progress Note    Assessment:    45 y.o patient with ESRD on HD, non compliance, HTN, recurrent ascites, who his coming due to several days of N/V, and blood draining from his PD catheter ( he is not a PD patient; cathter was put in to drain ascitic fluid and to possibly use of for PD)     -ESRD on HD; has clotted AVF for several weeks and had been getting HD through a left sided TDC  -HTN with CKD  -R/o peritonitis   -Anemia of CKD   -History of C. Diff colitis   -Non compliance     Plan:    -HD today;   -surgical evaluation   -send peritoneal fluid for cell count and cultures   -check iron stores   -ESA  -daily labs to include phos and Mag    Burnis Medin, MD  Office - [phone]  [employer] - [phone]  ++++++++++++++++++++++++++++++++++++++++++++++++++++++++++++++  Subjective:    C/o abdominal pain     Medications:  Scheduled Meds:  Current Facility-Administered Medications   Medication Dose Route Frequency   . amLODIPine  10 mg Oral Daily   . carvedilol  25 mg Oral Q12H SCH   . piperacillin-tazobactam       . piperacillin-tazobactam  2.25 g Intravenous Q8H SCH   . sevelamer  1,600 mg Oral TID MEALS   . vancomycin  250 mg Oral 4 times per day     Continuous Infusions:   PRN Meds:albuterol, hydrALAZINE, HYDROmorphone, HYDROmorphone, ondansetron    Objective:  Vital signs in last 24 hours:  Temp:  [98.4 F (36.9 C)-98.9 F (37.2 C)] 98.8 F (37.1 C)  Heart Rate:  [93-112] 97  Resp Rate:  [16-28] 19  BP: (146-181)/(84-113) 163/91 mmHg  Intake/Output last 24 hours:    Intake/Output Summary (Last 24 hours) at 11/10/15 1051  Last data filed at 11/10/15 0807   Gross per 24 hour   Intake    680 ml   Output      0 ml   Net    680 ml     Intake/Output this shift:  I/O this shift:  In: 200 [P.O.:100; IV Piggyback:100]  Out: -     Physical Exam:   Gen: NAD   CV: S1 S2 N RRR   Chest: CTAB   Ab: diffuse abdominal pain    Ext: No C/E    Labs:    Recent Labs  Lab  11/10/15  0424   GLUCOSE 71   BUN 37.0*   CREATININE 9.1*   CALCIUM 7.4*   SODIUM 139   POTASSIUM 5.1   CHLORIDE 106   CO2 20*       Recent Labs  Lab 11/10/15  0424 11/09/15  2247   WBC 6.75 6.30   HGB 8.3* 8.4*   HEMATOCRIT 26.4* 26.5*   MCV 95.3 95.0   MCH 30.0 30.1   MCHC 31.4* 31.7*   RDW 16* 16*   MPV 10.1 9.7   PLATELETS 171 163
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
## 2015-11-10 NOTE — Progress Notes (Signed)
MEDICINE PROGRESS NOTE    Date Time: 11/10/2015 12:09 PM  Patient Name: [name] D  Attending Physician: Rock Nephew,*    Assessment:     Patient states that he originally presented with n/v/diarrhea and that during that hospitalization his AV fistula became difficult to use so a permacath was placed. Patient with pain early into HD sessions and subsequently has not been able to under go full dialysis. He started to notice fluid accumulation and required multiple paracentesis procedures such that a peritoneal drainage catheter was placed. Patient believes it could double as a PD catheter.  He had pink tinged drainage (drains MWF on HD schedule) and became progressively more bloody over week.    45yo male with multiple recent complicated hospitalizations (please refer to admission HPI by Dr. Carleene Overlie) and recent AMA discharge now with   1. Nausea/vomiting and abdominal pain   2. Bloody drainage from PD catheter; recently placed and complicated by hemoperitoneum.  3. Acute on chronic anemia, anemia of CKD compounded by acute blood loss anemia   4. Recurrent abdominal ascites secondary to incomplete HD   5. Hx recurrent C. Difficile infection. Had positive C. Difficile PCR on 6/26 within [facility] health system but negative C. Difficile PCR on 6/27 in [place], 7/7 in [employer].  No current diarrhea reported  6. Concern for drug seeking behavior; has left multiple hospitals AMA in past month. Please see interim summary note for recent discharge summaries taken from Care Everywhere.    Chronic   #ESRD on HD   #hx CAD with PCI in 2010       Plan:   -H/H stable, okay to space out to q12 hours as long as hemodynamically stable   -continue with broad spectrum coverage for intra-abdominal source of infection antibiotics pending OSH cultures; maintain on po vancomycin for hx recurrent c. Diff while on other abx treatment   -nephrology consult for HD and fluid removal  -discussed nebulous intrabdominal  catheter issue with IR and surgery, will plan for non-contrast CT to assess for blood as well as location/type of catheter  -continue BB and Amlodipine, Hydralazine IV prn for uncontrolled BP.   -supportive care with antiemetics, pain medications. Will not escalate pain medications. Have added po medication as well    Case discussed with: surgery/IR/nephrology/bedside nurse, patient     Safety Checklist:     DVT prophylaxis:  CHEST guideline (See page [id number]) Mechanical   Foley:  [place] Rn Foley protocol Not present   IVs:  Central IV: Present; Indication:  permacath   PT/OT: Not needed   Daily CBC & or Chem ordered:  SHM/ABIM guidelines (see #5) Yes, due to clinical and lab instability   Reference for approximate charges of common labs: CBC auto diff - $76  BMP - $99  Mg - $79    Lines:     Patient Lines/Drains/Airways Status    Active PICC Line / CVC Line / PIV Line / Drain / Airway / Intraosseous Line / Epidural Line / ART Line / Line / Wound / Pressure Ulcer / NG/OG Tube     Name:   Placement date:   Placement time:   Site:   Days:    Permacath/Temporary Catheter Permacath Left               Peripheral IV Right Upper Arm        Upper Arm       Graft/Fistula Right  Graft/Fistula Left              Incision Site 11/09/15 Abdomen Anterior;Mid;Lower  11/09/15   1900     less than 1    Incision Site 11/09/15 Abdomen Anterior;Right;Lower  11/09/15   1900     less than 1    Incision Site 11/09/15 Chest Anterior;Upper;Left  11/09/15   1900     less than 1                 Disposition: (Please see PAF column for Expected D/C Date)   Today's date: 11/10/2015  Admit Date: 11/09/2015  6:42 PM  LOS: 1  Clinical Milestones: stable H/H, management of fluids  Anticipated discharge needs: resume outpatient HD      Subjective     CC: Nausea & vomiting    Interval History/24 hour events: Admitted.  Transferred to [facility] from ICU.      HPI/Subjective: Still with pain and nausea. Able to keep down a little bit of fluid.  Was  draining abdominal drain throughout week but only taking off a liter due to concerns about dropping BP.  Has not had diarrhea since admission    Review of Systems:     As above.    Physical Exam:     VITAL SIGNS PHYSICAL EXAM   Temp:  [98.4 F (36.9 C)-98.9 F (37.2 C)] 98.8 F (37.1 C)  Heart Rate:  [93-112] 96  Resp Rate:  [16-28] 27  BP: (146-181)/(84-113) 177/101 mmHg            Intake/Output Summary (Last 24 hours) at 11/10/15 1209  Last data filed at 11/10/15 1610   Gross per 24 hour   Intake    680 ml   Output      0 ml   Net    680 ml    Physical Exam  General: awake, alert X 3  Cardiovascular: regular rate and rhythm, no murmurs, rubs or gallops; left chest permacath site c/d/i  Lungs: clear to auscultation bilaterally, without wheezing, rhonchi, or rales  Abdomen: distended with catheter in place - serosanguinous material in the tubing, tender to palpation with minimal contact  Extremities: no edema       Meds:     Medications were reviewed:    Labs:     Labs (last 72 hours):      Recent Labs  Lab 11/10/15  0424 11/09/15  2247   WBC 6.75 6.30   HGB 8.3* 8.4*   HEMATOCRIT 26.4* 26.5*   PLATELETS 171 163         Recent Labs  Lab 11/10/15  0424 11/09/15  2247   PT 16.7* 16.2*   PT INR 1.4* 1.3*   PTT 40*  --       Recent Labs  Lab 11/10/15  0424   SODIUM 139   POTASSIUM 5.1   CHLORIDE 106   CO2 20*   BUN 37.0*   CREATININE 9.1*   CALCIUM 7.4*   GLUCOSE 71                 Signed by: Rock Nephew, MD

## 2015-11-10 NOTE — Plan of Care (Signed)
Problem: Safety  Goal: Patient will be free from injury during hospitalization  Pt educated about call bell use, bed alarms on, fall mats down. Hourly rounding done.    Problem: Side Effects from Pain Analgesia  Goal: Patient will experience minimal side effects of analgesic therapy  Pt VSS, resp status stable, unlabored. Pt A&O x4.    Problem: Moderate/High Fall Risk Score >5  Goal: Patient will remain free of falls  Bed alarm on, fall mats down, hourly rounding done to ensure pt safety. Fall risk assessment completed in documentation

## 2015-11-10 NOTE — Plan of Care (Signed)
[facility] boarder. A&O x4. OOB to toilet & CT table, standby assist. C/o abd pain 9/10 - 1 mg dilaudid q3 hr around the clock. 2 lt NC. Refuses bed alarm - turned off after pt repeatedly set it off on lowest setting. (Dawn aware.) CT abd showed large amount ascites, small amount old blood - should subside as ascites is drained. HD this evening. PT/INR q6h / H&H q12 - stable (should d/c). Very poor appetite, clear liq diet. Diarrhea BM x1, cdiff cx sent.
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
## 2015-11-10 NOTE — Progress Notes (Signed)
Pt restless during HD. Not able to reach fluid goal due to severe of cramping during Tx. Pt requested early termination of HD.        11/10/15 2215   Treatment Summary   Time Off Machine 2155   Duration of Treatment (Hours) 2:45   Dialyzer Clearance Moderately streaked   Fluid Volume Off (mL) 1800   Prime Volume (mL) 250   Rinseback Volume (mL) 250   Fluid Given: Normal Saline (mL) 0   Fluid Given: PRBC  0 mL   Fluid Given: Albumin (mL) 0   Fluid Given: Other (mL) 0   Total Fluid Given 500   Hemodialysis Net Fluid Removed 1300   Post Treatment Assessment   Patient Response to Treatment (Pt complained of sever cramping during HD)   Permacath/Temporary Catheter Permacath Left   No Placement Date or Time found.   Present on Admission?: Yes  Access Type: Permacath  Orientation: Left  Securement Method: Sutured;Transparent Dressing  Antimicrobial disc applied correctly: No  If antimicrobial disc not used, why?: (c) Other (comme...   Catheter Lumen Volume Venous 1.6 mL   Catheter Lumen Volume Arterial 1.6 mL   Dressing Status and Intervention Dressing Intact;Clean & Dry   Tego Caps on Catheter Yes   NEW Tego Caps placed (Date) 11/10/15   Vitals   Temp 98.9 F (37.2 C)   Heart Rate (!) 105   Resp Rate 20   BP 169/87 mmHg   SpO2 100 %   O2 Device None (Room air)   Assessment   Mental Status Alert;Oriented;Restless;Cooperative   Cardiac Regularity Regular   Cardiac Rhythm Normal Sinus Rhythm;Sinus Tach   Respiratory Pattern Regular;Unlabored   Bilateral Breath Sounds Clear;Diminished   R Breath Sounds Clear;Diminished   L Breath Sounds Clear;Diminished   Generalized Edema Non Pitting Edema   General Skin Color Appropriate for ethnicity   Skin Condition/Temp Warm;Dry   Abdomen Inspection Rounded;Taut   GI Symptoms Diarrhea   Education   Person taught Patient   Knowledge basis Substantial   Topics taught Procedure;Tx Options   Teaching Tools Explain   Gean Maidens Understanding   Bedside Nurse Communication   Name  of bedside RN - pre dialysis Chevis Pretty   Name of bedside RN - post dialysis Tyrone Apple

## 2015-11-10 NOTE — Progress Notes (Signed)
Held 2000 Coreg second to HD treatmen. Pt declines antihypertensives at this time. BP 171/86.

## 2015-11-10 NOTE — Progress Notes (Addendum)
Peritoneal drain hooked up to suction tubing and CRRT collection bag (as if fpr PD using Excert skin site cleanser and Alcavis high level disinfectant for connectors). Approximately 2 L noted thin, bloody in bag, pt reported abdominal cramping (has been reporting same abdominal pain during HD treatment) and that he has been draining off two liters at home and then will clamp drain. Notified Dr Rachel Moulds. H/H sent, drain now clamped. When measured, 1250cc actually drained. No change in VS.

## 2015-11-10 NOTE — Progress Notes (Signed)
11/10/15 1900   Bedside Nurse Communication   Name of bedside RN - pre dialysis Chevis Pretty   Treatment Initiation- With Dialysis Precautions   Time Out/Safety Check Completed Yes   Consent for HD signed for this hospitalization Y   Blood Consent Verified N/A   Dialysis Precautions All Connections Secured;Saline Line Double Clamped;Venous Parameters Set;Arterial Parameters Set;Air Foam Detecctor Engaged   Dialysis Treatment Type Routine;Bedside   Is patient diabetic? No   RO/Hemodialysis Cabin crew   Is Total Chlorine less than 0.1 ppm? Yes   Orignial Total Chlorine Testing Time 1850   At 4 Hour Total Chlorine Testing Time 2250   RO/Hemodialysis Dance movement psychotherapist Number [id number]   RO # 8   Water Hardness 0   pH 7.2   Pressure Test Verified Yes   Alarms Verified Passed   Machine Temperature 98.6 F (37 C)   Manual Machine Temperature 98.6 F (37 C)   Alarms Verified Yes   Na+ mEq (Machine) 138 mEq   Bicarb mEq (Machine) 35 mEq   Hemodialysis Conductivity (Machine) 14   Hemodialysis Conductivity (Meter) 14   Dialyzer Lot Number [id number]   Tubing Lot Number [id number]   RO Machine Log Completed Yes   Vitals   Heart Rate 100   Resp Rate 14   BP 169/87 mmHg   SpO2 98 %   O2 Device Nasal cannula   Assessment   Mental Status Alert;Oriented;Cooperative   Cardiac (WDL) WDL   Cardiac Regularity Regular   Cardiac Symptoms None   Cardiac Rhythm Normal Sinus Rhythm   Respiratory Pattern Regular;Unlabored   Bilateral Breath Sounds Clear;Diminished   R Breath Sounds Clear;Diminished   L Breath Sounds Clear;Diminished   General Skin Color Appropriate for ethnicity   Skin Condition/Temp Warm;Dry   Abdomen Inspection Rounded;Taut   GI Symptoms Diarrhea   Permacath/Temporary Catheter Permacath Left   No Placement Date or Time found.   Present on Admission?: Yes  Access Type: Permacath  Orientation: Left  Securement Method: Sutured;Transparent Dressing  Antimicrobial disc applied correctly: No  If  antimicrobial disc not used, why?: (c) Other (comme...   Dressing Status and Intervention Dressing Intact;Antibacterial Disc Applied;Clean & Dry   Pain Assessment   Charting Type Assessment   Pain Scale Used Numeric Scale (0-10)   Numeric Pain Scale   Pain Score 8   POSS Score 1   Pain Location Abdomen   Pain Descriptors Discomfort   Pain Frequency Increases with movement   Effect of Pain on Daily Activities severe   Patient's Stated Comfort Functional Goal 5   Pain Intervention(s) Medication;Distraction   Multiple Pain Sites No

## 2015-11-10 NOTE — Progress Notes (Signed)
Nurse leader rounding done on [name]. Pt educated about bed alarm and necessity in order to keep him safe while hospitalized. Pt argued about the need for setting a bed alarm and insisted he can safely get oob alone and shouldn't have to wait for a nurse to help, but agreed to let nurse manager set it to low.

## 2015-11-10 NOTE — Plan of Care (Signed)
Problem: Pain  Goal: Pain at adequate level as identified by patient  Outcome: Not Progressing    11/10/15 2120   Goal/Interventions addressed this shift   Pain at adequate level as identified by patient Assess pain on admission, during daily assessment and/or before any "as needed" intervention(s);Reassess pain within 30-60 minutes of any procedure/intervention, per Pain Assessment, Intervention, Reassessment (AIR) Cycle;Evaluate if patient comfort function goal is met;Evaluate patient's satisfaction with pain management progress;Identify patient comfort function goal   Pt reports 10/10 abdominal pain, relieved to 7/10 with IV Dilaudid. Pt reports at home, pain is 3/10 constantly, in hospital pain goal is 5/10. Pt declines PO medications that are ordered.     Problem: Side Effects from Pain Analgesia  Goal: Patient will experience minimal side effects of analgesic therapy  Outcome: Progressing    11/10/15 2120   Goal/Interventions addressed this shift   Patient will experience minimal side effects of analgesic therapy Monitor/assess patient's respiratory status (RR depth, effort, breath sounds);Assess for changes in cognitive function   Pt does not appear to have and adverse effects after IV Dilaudid administration.     Problem: Moderate/High Fall Risk Score >5  Goal: Patient will remain free of falls  Outcome: Progressing  Pt declines to have bed alarm set. Charge nurse notified and per report, unit manager aware.     Comments:   Pt is currently undergoing HD. With HD nurse, peritoneal drain accessed (treated like a PD cath under sterile precautions), pt declines to have abdomen drained until after HD completed. Peritoneal fluid specimens sent. See flowsheets for further assessment.

## 2015-11-10 NOTE — Plan of Care (Signed)
See VS and Flowsheets for trending data. [facility] Boarder. A/O x 4, MAE, Walks in Room, Standby Assist. Constant ABD pain, 10/10 reported by patient. On Dilaudid regiment q3h PRN, with Dilaudid being requested by name q1h by patient. 4mg  Dilaudid given this shift. Pt also mentioned that he has left facilities in the past for not adequately controlling his pain. Noted. Hydralazine x 1 PRN for SBP >165. Afebrile, ST low 100s. Nauseated throughout night, Zofran x 2 PRN. No vomiting, BM x 1, soft, brown, unable to obtain sample as pt emptied hat into toilet. Voided in toilet as well, dark amber in color, no odor. Permacath dressing changed, Biopatch applied per protocol. Consult Nephrology in AM for HD (normally M, W, F), and to IR this AM for Paracentesis. Continue to monitor.

## 2015-11-10 NOTE — Plan of Care (Signed)
Peritoneal drain drained 2 L thin bloody fluid  Will check H&H, asked RN to clamp

## 2015-11-10 NOTE — Progress Notes (Addendum)
Surgery progress note:    Pt seen and examined with Dr. Noreene Filbert. CT reviewed and primarily show ascites with small amount of blood in the dependent pelvis (likely old). H/H stable. No acute surgical intervention at this time. Ok to drain ascites using catheter from surgery standpoint (expect some of the old blood will drain too). Surgery will sign off. Please call with questions. Discussed with Dr. Perrin Maltese.    Claris Gladden, MD  General Surgery [phone]  [phone]    Trauma & Acute Care Surgery Attending:     I evaluated the pt at 9:55 am.    As above. Ok to use catheter, may drain old blood seen intrabdominal. But no evidence of active bleeding.  Will sigh off.    Elenore Paddy, MD  Trauma, Acute Care Surgery  Surgical Critical Care

## 2015-11-10 NOTE — Progress Notes (Incomplete)
Patient alert and oriented X4. Complains of sever pain in ad       11/10/15 1910   Permacath/Temporary Catheter Permacath Left   No Placement Date or Time found.   Present on Admission?: Yes  Access Type: Permacath  Orientation: Left  Securement Method: Sutured;Transparent Dressing  Antimicrobial disc applied correctly: No  If antimicrobial disc not used, why?: (c) Other (comme...   Line necessity reviewed? Dialysis   Dressing Status and Intervention Dressing Intact;Clean & Dry   Tego Caps on Catheter Yes   NEW Tego Caps placed (Date) 11/10/15   End Caps Free From Blood Yes   Dressing Type Transparent   Dressing Status Clean;Dry;Intact   Dressing/Line Intervention Caps changed   Biopatch Used? ([facility] Only) Yes   Line Used For Blood Draw No   Dressing Change Due 11/16/15   Vitals   Temp 99.2 F (37.3 C)   Heart Rate (!) 101   Resp Rate 20   BP 173/86 mmHg   SpO2 97 %   O2 Device Nasal cannula   Machine Metrics   $Treatment Started/Capturing Charge Yes   Blood Flow Rate (mL/min) 400 mL/min   Arterial Pressure (mmHg) -150 mmHg   Venous Pressure (mmHg) 170   Dialysate Flow Rate (mL/min) 600 mL/min   Transmembrane Pressure (mmHg) 50 mmHg   Ultrafiltration Rate (mL/Hr) 1000 mL/hr   Fluid Removal (ml) 0 ml   Fluid Bolus (ml) 250 ml  (Prime given)   Dialysate K (mEq/L) 2 mEq/L   Dialysate CA (mEq/L) 2.5 mEq/L   Hemodialysis Comments   Arteriovenous Lines Secure Yes   Comments (HD started via [facility])

## 2015-11-10 NOTE — Interim Summary (Addendum)
OSH records/recent discharge summaries (copy and pasted from care everywhere).     Note there appears to be a 4th hospitalization between 6/9 and 6/26 [facility]) from which he also left AMA.    [name], please refer to Jorge Connors, [name], which is dated 10/23/2015. In summary, [name] is a 45 y.o. male who presented with abdomen pain and diarrhea    Hospital Course:   [name] was admitted to [facility] on 10/23/2015.    Recurrent diarrhea onging for few months. -stool negative for C. Diff tox, stool culture was negative. He was placed on empiric po vancomycin. He still had continued diarrhea and was seen by Dr Chales Abrahams and was started on xifaxan for possible small bacterial overgrowth.    Ascites-HD related, he has a hx of PD in the past and may have altered permeability of his peritoneum; also due to incomplete HD sessions in the past due to him interrupting them due to alledged LUE pain, he developed anasarca , a PD cath was placed by dr. Hortencia Pilar to manage his ascites at home and to consider PD in the future with his nephrologist in [place]; here we placed a HD L sided PC to get full HD sessions, due to persitent subjective pain in LUE AVF-which was functioning ok. Post PD Catheter placement patient had hemoperitoneum and was transferred to the intensive care unit where he required blood transfusion for anemia. He was stabilized and was transferred out of the ICU. He has been undergoing ultrafiltration and dialysis per the nephrology team. Echo was noted to have Mild mod pHTN and mild/ mod MR, pt can't do cardiac MRI due to need for contrast for it, but given concern here is more for incomplete HD over past number of months than a true restrictive CM, he needs a trial of proper HD sessions before any further cardiac w/o to be done; his 2d echo findings would not explain  there degree of ascites though, but incomplete HD would. He was seen by cardiology and did not think primary cardiac process for the ascites. The ascitic fluid was transudative in nature and path was negative for malignant cells. Nephrology team felt the ascites was hemodialysis related. He was seen by Dr Chales Abrahams from gastroenterology and recommended liver/spleen scan to complete the work up which was not done as patient left AMA.    Patient c/o pain extremities and abdomen and was on iv dilaudid. There was concern for narcotic seeking behaviour as noted in the documentation by the previous hospitalist. Patient was reluctant to consider oral regimen for pain. When the dosage of the IV dilaudid was changed to every 4 hrs along with oral dilaudid, patient was very upset and left AMA. He understood the risk including death if he left AMA.      [facility] system - [address] [place], Admission 6/26 - 6/30  ADMITTING DIAGNOSES & HOSPITAL COURSE:   This is a 45 year old man with a past medical history significant for end-stage renal disease on   hemodialysis, hypertension, recurrent C difficile, and asthma, who was in his   usual state of health until a week prior to admission, when he started having increased abdominal pain, distension and diarrhea. Patient has a history of recurrent C. Diff. He was admitted to [facility]. Per patient, the test for C. Diff was positive and he was  started on treatment. However, he did not like the service, so left the hospital and came to [facility] instead.     The patient also complains of recurrent abdominal ascites since January this year. He reports he had a liver biopsy in the past and he was told he has not liver disease.     Patient reports two episodes of spontaneous bacterial peritonitis in the past.     Diarrhea: resolved  - seems resolved, patient had a formed BM earlier today.   - sample not accepted to be tested for C. Diff;  - second BM loose, C. Diff negative.  - will attempt  to get records/Discharge Summary from [facility].   - empirically on PO Vancomycin and IV Flagyl.  - 6/28: per GI, recommend a long Vancomycin taper in the setting of refractory C. Diff. ID following. Currently on PO Flagyl, continue.   - 6/30: no more diarrhea.     ESRD:  - HD dependent;   - Nephrology consulted: last dialysis on Friday, missed dialysis yesterday;  - 6/28: Dialysis was attempted using the Left arm AV fistula, but not completed because of pain/ no flow. Quinton catheter was placed in right groin this afternoon.  - Plan per Nephrology: daily HD x3.  - Fistulogram 6/29: no stenosis.   - 6/30: Dialysis yesterday and about 1 h today, interrupted due to increased SOB. Per report, the flow was also slow through the [place].   - Fluid restriction.   - Remove Quinton prior to discharge.     HTN:  - uncontrolled;  - patient states that his BP drops with Dialysis, so we will monitor for now;   - resume home BP medications when able;   - patient has occasionally refused to take his BP medications.     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   - patient can f/u with Hepatology as outpatient, but the impression is that the ascites is probably due to lack of efficient fluid removal.    Asthma:   - stable, no SOB;    Anemia:  - most likely anemia of chronic disease.   - management per Nephrology.     Nicotine addiction:  - patient was counseled on admission;   - declined Nicotine patch;     Patient was discharged to home in stable condition.   He was urged to resume his usual dialysis schedule as outpatient.     [facility] 6/8-6/9:   Hospital Course:  The patient is a 45 year old gentleman with history of stage V end-stage renal disease on hemodialysis, pulmonary  hypertension, chronic systolic heart failure, hypertension, coronary disease, chronic anemia, prior C. difficile, who was admitted overnight for symptoms of abdominal pain, diarrhea, shortness of breath. He was found to have volume overload with pulmonary edema and ascites. In addition has prior history of C. difficile and presented with diarrhea. C. difficile toxin ordered but no stool collected. His additionally found to have acute on chronic anemia with hemoglobin of 7, hypocalcemia, elevated creatinine.    The patient was arranged for nephrology consult this morning and plan was for hemodialysis. I was paged by the nursing staff at approximately [phone] that the patient wished to leave against medical advice. I came  and spoke with the patient at the bedside. He was calm and rational. He stated that he wished to leave the hospital so he could travel back to [place] and make his regularly scheduled dialysis time. I asked him if we had done something to upset him and he said "no, to the contrary everyone has been nice." He he denies having further stool since admission. He denies abdominal pain and short of breath this time. I discussed with him that I felt it was a poor decision to leave AMA at this time to travel back to [facility] [facility] and have dialysis later this afternoon. I discussed with him that he had a low blood count with a hemoglobin of 7, that he had elevated potassium of 5.2, and a low calcium. I spent to him that these abnormal values could lead to arrhythmia and death. I stated that Dr. Charlton Haws had been in this morning and that we could have him dialyzed this morning and then if he felt well may be he could be discharged this afternoon. He declined this and stated he just wanted to leave this morning. We reviewed the risks in doing so and he verbalized understanding. I asked that he talk to his dialysis staff about receiving blood and having his labs redrawn. He signed the [facility] form.

## 2015-11-11 LAB — PT/INR
PT INR: 1.4 — ABNORMAL HIGH (ref 0.9–1.1)
PT INR: 1.4 — ABNORMAL HIGH (ref 0.9–1.1)
PT INR: 1.3 — ABNORMAL HIGH (ref 0.9–1.1)
PT INR: 1.2 — ABNORMAL HIGH (ref 0.9–1.1)
PT: 16.8 s — ABNORMAL HIGH (ref 12.6–15.0)
PT: 17.4 s — ABNORMAL HIGH (ref 12.6–15.0)
PT: 16.4 s — ABNORMAL HIGH (ref 12.6–15.0)
PT: 15.7 s — ABNORMAL HIGH (ref 12.6–15.0)

## 2015-11-11 LAB — IRON PROFILE
Iron Saturation: 23 % (ref 15–50)
Iron: 40 ug/dL (ref 40–160)
TIBC: 174 ug/dL — ABNORMAL LOW (ref 261–462)
UIBC: 134 ug/dL (ref 126–382)

## 2015-11-11 LAB — CBC
Absolute NRBC: 0 10*3/uL
Hematocrit: 26.4 % — ABNORMAL LOW (ref 42.0–52.0)
Hgb: 8.2 g/dL — ABNORMAL LOW (ref 13.0–17.0)
MCH: 29.4 pg (ref 28.0–32.0)
MCHC: 31.1 g/dL — ABNORMAL LOW (ref 32.0–36.0)
MCV: 94.6 fL (ref 80.0–100.0)
MPV: 10.2 fL (ref 9.4–12.3)
Nucleated RBC: 0 /100 WBC (ref 0.0–1.0)
Platelets: 182 10*3/uL (ref 140–400)
RBC: 2.79 10*6/uL — ABNORMAL LOW (ref 4.70–6.00)
RDW: 16 % — ABNORMAL HIGH (ref 12–15)
WBC: 6.18 10*3/uL (ref 3.50–10.80)

## 2015-11-11 LAB — HEMOLYSIS INDEX: Hemolysis Index: 34 — ABNORMAL HIGH (ref 0–18)

## 2015-11-11 LAB — BASIC METABOLIC PANEL
BUN: 27 mg/dL (ref 9.0–28.0)
CO2: 21 mEq/L — ABNORMAL LOW (ref 22–29)
Calcium: 7 mg/dL — ABNORMAL LOW (ref 8.5–10.5)
Chloride: 102 mEq/L (ref 100–111)
Creatinine: 7.2 mg/dL — ABNORMAL HIGH (ref 0.7–1.3)
Glucose: 83 mg/dL (ref 70–100)
Potassium: 4.1 mEq/L (ref 3.5–5.1)
Sodium: 137 mEq/L (ref 136–145)

## 2015-11-11 LAB — HEMOGLOBIN AND HEMATOCRIT, BLOOD
Hematocrit: 26.4 % — ABNORMAL LOW (ref 42.0–52.0)
Hgb: 8.2 g/dL — ABNORMAL LOW (ref 13.0–17.0)

## 2015-11-11 LAB — FERRITIN: Ferritin: 2182.48 ng/mL — ABNORMAL HIGH (ref 21.80–274.70)

## 2015-11-11 LAB — GFR: EGFR: 10

## 2015-11-11 LAB — BODY FLUID PATH REVIEW-

## 2015-11-11 MED ORDER — DARBEPOETIN ALFA 60 MCG/0.3ML IJ SOSY
60.0000 ug | PREFILLED_SYRINGE | INTRAMUSCULAR | Status: DC
Start: 2015-11-11 — End: 2015-11-12
  Filled 2015-11-11: qty 0.3

## 2015-11-11 NOTE — Plan of Care (Signed)
Assumed care of patient around 930

## 2015-11-11 NOTE — Progress Notes (Signed)
[facility], [employer].  Progress Note      Assessment:    45 y.o patient with ESRD on HD, non compliance, HTN, recurrent ascites, who his coming due to several days of N/V, and blood draining from his PD catheter ( he is not a PD patient; cathter was put in to drain ascitic fluid and to possibly use of for PD) (Given h/o of non-compliance, I do not think he could do CAPD)    -ESRD on HD; has clotted AVF for several weeks and had been getting HD through a left sided TDC  -HTN with CKD  -Anemia of CKD , on aranesp  -History of C. Diff colitis   -Non compliance     Plan:    -HD MWF         This patient is not routinely followed by a physician of our group in our office... or dialysis clinic.  The patient  will need to be  followed, post discharge, at the dialysis clinic in [place] by Dr. Rica Mast.      Sherian Maroon, MD  Office [phone]  Spectra Link - n/a    ++++++++++++++++++++++++++++++++++++++++++++++++++++++++++++++  Subjective:  No new complaints    Medications:  Scheduled Meds:  Current Facility-Administered Medications   Medication Dose Route Frequency   . amLODIPine  10 mg Oral Daily   . carvedilol  25 mg Oral Q12H SCH   . darbepoetin alfa  60 mcg Subcutaneous Weekly   . piperacillin-tazobactam  2.25 g Intravenous Q8H SCH   . sevelamer  1,600 mg Oral TID MEALS   . vancomycin  250 mg Oral 4 times per day     Continuous Infusions:   PRN Meds:albuterol, hydrALAZINE, HYDROmorphone, HYDROmorphone, ondansetron    Objective:  Vital signs in last 24 hours:  Temp:  [98.9 F (37.2 C)-99.4 F (37.4 C)] 99.3 F (37.4 C)  Heart Rate:  [97-118] 103  Resp Rate:  [14-32] 28  BP: (136-204)/(75-138) 136/78 mmHg  Intake/Output last 24 hours:    Intake/Output Summary (Last 24 hours) at 11/11/15 1211  Last data filed at 11/11/15 1000   Gross per 24 hour   Intake   1300 ml   Output   2550 ml   Net  -1250 ml     Intake/Output this shift:  I/O this shift:  In: 340 [P.O.:240; IV  Piggyback:100]  Out: -     Physical Exam:   Gen: alert, well developed, well nourished, No distress   Cardiac:  S1 S2,  no rub, no murmer   Chest: decreased, no crackles, no wheezes   Abdomen:  distended, firm, non tender, no organomegaly   Ext: 2-3+ edema              Vascular access, fistula, is patent, but not being used      Labs:    Recent Labs  Lab 11/11/15  0557 11/10/15  0424   GLUCOSE 83 71   BUN 27.0 37.0*   CREATININE 7.2* 9.1*   CALCIUM 7.0* 7.4*   SODIUM 137 139   POTASSIUM 4.1 5.1   CHLORIDE 102 106   CO2 21* 20*       Recent Labs  Lab 11/11/15  0557 11/10/15  2225 11/10/15  1659 11/10/15  0424 11/09/15  2247   WBC 6.18  --   --  6.75 6.30   HGB 8.2* 8.3* 8.0* 8.3* 8.4*   HEMATOCRIT 26.4* 26.1* 25.5* 26.4* 26.5*   MCV 94.6  --   --  95.3 95.0   MCH 29.4  --   --  30.0 30.1   MCHC 31.1*  --   --  31.4* 31.7*   RDW 16*  --   --  16* 16*   MPV 10.2  --   --  10.1 9.7   PLATELETS 182  --   --  171 163

## 2015-11-11 NOTE — Progress Notes (Signed)
MEDICINE PROGRESS NOTE    Date Time: 11/11/2015 5:07 PM  Patient Name: [name] D  Attending Physician: Verdene Lennert, MD    Assessment:     Patient states that he originally presented with n/v/diarrhea and that during that hospitalization his AV fistula became difficult to use so a permacath was placed. Patient with pain early into HD sessions and subsequently has not been able to under go full dialysis. He started to notice fluid accumulation and required multiple paracentesis procedures such that a peritoneal drainage catheter was placed. Patient believes it could double as a PD catheter.  He had pink tinged drainage (drains MWF on HD schedule) and became progressively more bloody over week.    45yo male with multiple recent complicated hospitalizations (please refer to admission HPI by Dr. Carleene Overlie) and recent AMA discharge now with   1. Nausea/vomiting and abdominal pain   2. Bloody drainage from PD catheter; recently placed and complicated by hemoperitoneum.  3. Acute on chronic anemia, anemia of CKD compounded by acute blood loss anemia   4. Recurrent abdominal ascites secondary to incomplete HD   5. Hx recurrent C. Difficile infection. Had positive C. Difficile PCR on 6/26 within [facility] health system but negative C. Difficile PCR on 6/27 in [place], 7/7 in [employer].  No current diarrhea reported  6. Concern for drug seeking behavior; has left multiple hospitals AMA in past month. Please see interim summary note for recent discharge summaries taken from Care Everywhere.    Chronic   #ESRD on HD   #hx CAD with PCI in 2010       Plan:     -H/H stable, okay to space out to daily   -continue with broad spectrum coverage for intra-abdominal source of infection antibiotics pending OSH cultures; maintain on po vancomycin for hx recurrent c. Diff while on other abx treatment   -spoke to nephrology Dr. Gracy Racer and pt is not a candidate for PD HD due to non-compliance.  He states its ok to drain from peritoneal  drain on non-HD days.    -discussed nebulous intrabdominal catheter issue with IR and surgery, will plan for non-contrast CT to assess for blood as well as location/type of catheter  -continue BB and Amlodipine, Hydralazine IV prn for uncontrolled BP.   -pt requesting diet to be advanced  -supportive care with antiemetics, pain medications. Will not escalate pain medications. Have added po medication as well    Case discussed with: nephrology/bedside nurse, patient     Safety Checklist:     DVT prophylaxis:  CHEST guideline (See page e199S) Mechanical   Foley:  [place] Rn Foley protocol Not present   IVs:  Central IV: Present; Indication:  permacath   PT/OT: Not needed   Daily CBC & or Chem ordered:  SHM/ABIM guidelines (see #5) Yes, due to clinical and lab instability   Reference for approximate charges of common labs: CBC auto diff - $76  BMP - $99  Mg - $79    Lines:     Patient Lines/Drains/Airways Status    Active PICC Line / CVC Line / PIV Line / Drain / Airway / Intraosseous Line / Epidural Line / ART Line / Line / Wound / Pressure Ulcer / NG/OG Tube     Name:   Placement date:   Placement time:   Site:   Days:    Permacath/Temporary Catheter Permacath Left               Peripheral  IV Right Upper Arm        Upper Arm       Graft/Fistula Right              Graft/Fistula Left              Incision Site 11/09/15 Abdomen Anterior;Mid;Lower  11/09/15   1900     less than 1    Incision Site 11/09/15 Abdomen Anterior;Right;Lower  11/09/15   1900     less than 1    Incision Site 11/09/15 Chest Anterior;Upper;Left  11/09/15   1900     less than 1                 Disposition: (Please see PAF column for Expected D/C Date)   Today's date: 11/11/2015  Admit Date: 11/09/2015  6:42 PM  LOS: 2  Clinical Milestones: stable H/H, management of fluids  Anticipated discharge needs: resume outpatient HD      Subjective     CC: Nausea & vomiting    Interval History/24 hour events: Admitted.  Transferred to [facility] from ICU.       HPI/Subjective: Still with pain and nausea. Able to keep down a little bit of fluid.  Was draining abdominal drain throughout week but only taking off a liter due to concerns about dropping BP.  Has not had diarrhea since admission    Review of Systems:     As above.    Physical Exam:     VITAL SIGNS PHYSICAL EXAM   Temp:  [98.8 F (37.1 C)-99.4 F (37.4 C)] 98.8 F (37.1 C)  Heart Rate:  [97-118] 101  Resp Rate:  [14-32] 20  BP: (136-204)/(75-138) 136/78 mmHg            Intake/Output Summary (Last 24 hours) at 11/11/15 1707  Last data filed at 11/11/15 1200   Gross per 24 hour   Intake   1540 ml   Output   2550 ml   Net  -1010 ml    Physical Exam  General: awake, alert X 3  Cardiovascular: regular rate and rhythm, no murmurs, rubs or gallops; left chest permacath site c/d/i  Lungs: clear to auscultation bilaterally, without wheezing, rhonchi, or rales  Abdomen: distended with catheter in place - serosanguinous material in the tubing, tender to palpation with minimal contact  Extremities: no edema       Meds:     Medications were reviewed:    Labs:     Labs (last 72 hours):      Recent Labs  Lab 11/11/15  0557 11/10/15  2225  11/10/15  0424   WBC 6.18  --   --  6.75   HGB 8.2* 8.3* More results in Results Review 8.3*   HEMATOCRIT 26.4* 26.1* More results in Results Review 26.4*   PLATELETS 182  --   --  171   More results in Results Review = values in this interval not displayed.      Recent Labs  Lab 11/11/15  1037 11/11/15  0557  11/10/15  0424   PT 17.4* 16.8* More results in Results Review 16.7*   PT INR 1.4* 1.4* More results in Results Review 1.4*   PTT  --   --   --  40*   More results in Results Review = values in this interval not displayed.   Recent Labs  Lab 11/11/15  0557 11/10/15  0424   SODIUM 137 139   POTASSIUM 4.1 5.1  CHLORIDE 102 106   CO2 21* 20*   BUN 27.0 37.0*   CREATININE 7.2* 9.1*   CALCIUM 7.0* 7.4*   GLUCOSE 83 71                 Signed by: Verdene Lennert, MD

## 2015-11-11 NOTE — Plan of Care (Signed)
Problem: Pain  Goal: Pain at adequate level as identified by patient  Outcome: Not Progressing  Patient receiving pain medication q 3 hours, refusing oral pain medication at this time.      Please see doc flowsheet for vitals and trends. Patient is alert and oriented x 4, complains of pain to abdomen, dilaudid IV given q 3 hours with moderate effect. Patient refusing oral dilaudid at this time. Patient sinus rhythm to tach, hypertensive at times. 1L drained from peritoneal catheter as ordered, sanguinous.  Afebrile. Nasal canula 1L for comfort. Diet advanced to renal, poor appetite. Zofran given x 2 with good effect. Up out of bed to commode with 1 bm. Updated about plan of care. Support provided.

## 2015-11-12 LAB — BASIC METABOLIC PANEL
BUN: 33 mg/dL — ABNORMAL HIGH (ref 9.0–28.0)
CO2: 21 mEq/L — ABNORMAL LOW (ref 22–29)
Calcium: 6.7 mg/dL — ABNORMAL LOW (ref 8.5–10.5)
Chloride: 106 mEq/L (ref 100–111)
Creatinine: 8.6 mg/dL — ABNORMAL HIGH (ref 0.7–1.3)
Glucose: 98 mg/dL (ref 70–100)
Potassium: 4.1 mEq/L (ref 3.5–5.1)
Sodium: 140 mEq/L (ref 136–145)

## 2015-11-12 LAB — PT/INR
PT INR: 1.4 — ABNORMAL HIGH (ref 0.9–1.1)
PT: 17.4 s — ABNORMAL HIGH (ref 12.6–15.0)

## 2015-11-12 LAB — GFR: EGFR: 8.2

## 2015-11-12 LAB — HEMOGLOBIN AND HEMATOCRIT, BLOOD
Hematocrit: 25.8 % — ABNORMAL LOW (ref 42.0–52.0)
Hematocrit: 26.1 % — ABNORMAL LOW (ref 42.0–52.0)
Hgb: 8.1 g/dL — ABNORMAL LOW (ref 13.0–17.0)
Hgb: 8.1 g/dL — ABNORMAL LOW (ref 13.0–17.0)

## 2015-11-12 MED ORDER — HYDROMORPHONE HCL 1 MG/ML IJ SOLN
1.0000 mg | Freq: Four times a day (QID) | INTRAMUSCULAR | Status: DC | PRN
Start: 2015-11-12 — End: 2015-11-12

## 2015-11-12 MED ORDER — SODIUM CHLORIDE 0.9 % IV BOLUS
250.0000 mL | INTRAVENOUS | Status: AC | PRN
Start: 2015-11-12 — End: 2015-11-12

## 2015-11-12 MED ORDER — SODIUM CHLORIDE 0.9 % IV BOLUS
250.0000 mL | INTRAVENOUS | Status: DC | PRN
Start: 2015-11-12 — End: 2015-11-12

## 2015-11-12 MED ORDER — SODIUM CHLORIDE 0.9 % IV BOLUS
100.0000 mL | INTRAVENOUS | Status: AC | PRN
Start: 2015-11-12 — End: 2015-11-12

## 2015-11-12 MED ORDER — HEPARIN SODIUM (PORCINE) 1000 UNIT/ML IJ SOLN
4000.0000 [IU] | INTRAMUSCULAR | Status: DC | PRN
Start: 2015-11-12 — End: 2015-11-12

## 2015-11-12 MED ORDER — SODIUM CHLORIDE 0.9 % IV BOLUS
100.0000 mL | INTRAVENOUS | Status: DC | PRN
Start: 2015-11-12 — End: 2015-11-12

## 2015-11-12 MED ORDER — HEPARIN SODIUM (PORCINE) 1000 UNIT/ML IJ SOLN
INTRAMUSCULAR | Status: AC
Start: 2015-11-12 — End: ?
  Filled 2015-11-12: qty 10

## 2015-11-12 NOTE — Progress Notes (Signed)
Patient alert and oriented x 4, following commands. PRN IV Dilaudid given for pain management, pt refusing PO dilaudid. Refused hemodialysis twice- this morning and afternoon. Refusing antihypertensive meds. Refusing SCDs. Dr. Clydene Fake made aware of the above. Pt is anuric. OOB to commode with standby. x1 liquid BM. Patient contemplating leaving AMA, spoke with case manager, pt decided to stay the night.

## 2015-11-12 NOTE — UM Notes (Signed)
INPT CSR 7/26 /17  Renal Failure, Chronic - Care Day 4    BP 143/72 mmHg  Pulse 100  Temp(Src) 98.9 F (37.2 C) (Oral)  Resp 18  Ht 1.854 m (6\' 1" )  Wt 110.8 kg (244 lb 4.3 oz)  BMI 32.23 kg/m2  SpO2 98%      - pt refusing dialysis, states he is too nauseated  -pt refusing Corg and Amlodipine, BP 170's  -pt requesting IV Dilaudid Prn pain every 3 hrs over night, refusing po pain meds , says it takes too long to work    Abnormal labs: PT 17.4, PT INR 1.4, BUN 33, Creatinine 8.6, Calcium 6.7, C02 21, H&H 8.1/25.8    Scheduled Meds:  Current Facility-Administered Medications   Medication Dose Route Frequency   . amLODIPine  10 mg Oral Daily   . carvedilol  25 mg Oral Q12H SCH   . darbepoetin alfa  60 mcg Subcutaneous Weekly   . piperacillin-tazobactam  2.25 g Intravenous Q8H SCH   . sevelamer  1,600 mg Oral TID MEALS   . vancomycin  250 mg Oral 4 times per day   :   PRN Meds:.albuterol, heparin (porcine), hydrALAZINE, HYDROmorphone, HYDROmorphone, ondansetron, sodium chloride, sodium chloride      Plan:  -H&H 1 day  Broad spectrum coverage with ABS  -not candidate for PD HD due to non-compliance  -Non contrast  CT to assess Intra-abdominal catheter  - supportive care with antiemetics, pain meds. Will not escalate pain meds    Dispo: outpt dialysis [name] MWF; f/u at Dialysis clinic in [facility], Charity fundraiser, BSN  ACM  Utilization Review Case Manager   [employer]  [phone]

## 2015-11-12 NOTE — Progress Notes (Signed)
Patient refusing Coreg and amlodipine. MD made aware. Will continue to monitor.

## 2015-11-12 NOTE — Progress Notes (Addendum)
Patient refusing hemodialysis, states he is too nauseated. PRN Zofran given. Dr. Clydene Fake notified.

## 2015-11-12 NOTE — Progress Notes (Signed)
MEDICINE PROGRESS NOTE    Date Time: 11/12/2015 5:00 PM  Patient Name: [name]  Attending Physician: Verdene Lennert, MD    Assessment:     Patient states that he originally presented with n/v/diarrhea and that during that hospitalization his AV fistula became difficult to use so a permacath was placed. Patient with pain early into HD sessions and subsequently has not been able to under go full dialysis. He started to notice fluid accumulation and required multiple paracentesis procedures such that a peritoneal drainage catheter was placed. Patient believes it could double as a PD catheter.  He had pink tinged drainage (drains MWF on HD schedule) and became progressively more bloody over week.    45yo male with multiple recent complicated hospitalizations (please refer to admission HPI by Dr. Carleene Overlie) and recent AMA discharge now with   1. Nausea/vomiting and abdominal pain   2. Bloody drainage from PD catheter; recently placed and complicated by hemoperitoneum.  3. Acute on chronic anemia, anemia of CKD compounded by acute blood loss anemia   4. Recurrent abdominal ascites secondary to incomplete HD   5. Hx recurrent C. Difficile infection. Had positive C. Difficile PCR on 6/26 within [facility] health system but negative C. Difficile PCR on 6/27 in [place], 7/7 in [employer].  No current diarrhea reported  6. Concern for drug seeking behavior; has left multiple hospitals AMA in past month. Please see interim summary note for recent discharge summaries taken from Care Everywhere.    Chronic   #ESRD on HD   #hx CAD with PCI in 2010       Plan:     -H/H stable change to Q12  -continue with broad spectrum coverage for intra-abdominal source of infection antibiotics pending OSH cultures; Our cx negative to date.    -cont po vancomycin for hx recurrent c. Diff while on other abx treatment but has been taking it intermittently  -pt refused HD this morning due to nausea.  Pt refusing many of his medications.  Pt  refusing to try oral dilaudid.  I explained that we cannot give him IV narcotics and he refuses all treatment.  Pt states he wants to be discharged back to [facility] and wants to see the case manager.  Pt may leave AMA if he decides.    -changed dilaudid IV from Q3 to Q6 hrs prn for breakthrough pain.  He will have to try po dilaudid first  -I have spoken to renal, Dr. Regino Schultze to explain the current situation.  Pt is not a candidate for PD due to non-compliance.  Cont to drain fluid on non-dialysis days if he stays.    -continue BB and Amlodipine, Hydralazine IV prn for uncontrolled BP.     Case discussed with: nephrology/bedside nurse, patient     Safety Checklist:     DVT prophylaxis:  CHEST guideline (See page e199S) Mechanical   Foley:  [place] Rn Foley protocol Not present   IVs:  Central IV: Present; Indication:  permacath   PT/OT: Not needed   Daily CBC & or Chem ordered:  SHM/ABIM guidelines (see #5) Yes, due to clinical and lab instability   Reference for approximate charges of common labs: CBC auto diff - $76  BMP - $99  Mg - $79    Lines:     Patient Lines/Drains/Airways Status    Active PICC Line / CVC Line / PIV Line / Drain / Airway / Intraosseous Line / Epidural Line / ART Line /  Line / Wound / Pressure Ulcer / NG/OG Tube     Name:   Placement date:   Placement time:   Site:   Days:    Permacath/Temporary Catheter Permacath Left               Peripheral IV Right Upper Arm        Upper Arm       Graft/Fistula Right              Graft/Fistula Left              Incision Site 11/09/15 Abdomen Anterior;Mid;Lower  11/09/15   1900     less than 1    Incision Site 11/09/15 Abdomen Anterior;Right;Lower  11/09/15   1900     less than 1    Incision Site 11/09/15 Chest Anterior;Upper;Left  11/09/15   1900     less than 1                 Disposition: (Please see PAF column for Expected [name]/C Date)   Today's date: 11/12/2015  Admit Date: 11/09/2015  6:42 PM  LOS: 3  Clinical Milestones: stable H/H, management of  fluids  Anticipated discharge needs: resume outpatient HD      Subjective     CC: Nausea & vomiting    Interval History/24 hour events: Admitted.  Transferred to [facility] from ICU.      HPI/Subjective: Still with abd pain and nausea. Pt refusing most of his medications.  He refused HD this morning since he was nauseated.  Pt refusing to try po dilaudid.  He has received multiple doses of IV dilaudid.  He was drained 2 liters yesterday.  1 episode of diarrhea today.    Review of Systems:     As above.    Physical Exam:     VITAL SIGNS PHYSICAL EXAM   Temp:  [98.8 F (37.1 C)-99.3 F (37.4 C)] 98.9 F (37.2 C)  Heart Rate:  [90-106] 101  Resp Rate:  [13-25] 13  BP: (143-187)/(72-100) 187/97 mmHg            Intake/Output Summary (Last 24 hours) at 11/12/15 1700  Last data filed at 11/12/15 1500   Gross per 24 hour   Intake    750 ml   Output   2175 ml   Net  -1425 ml    Physical Exam  Pt refused PE       Meds:     Medications were reviewed:    Labs:     Labs (last 72 hours):      Recent Labs  Lab 11/12/15  1339 11/12/15  0517  11/11/15  0557  11/10/15  0424   WBC  --   --   --  6.18  --  6.75   HGB 8.1* 8.1* More results in Results Review 8.2* More results in Results Review 8.3*   HEMATOCRIT 26.1* 25.8* More results in Results Review 26.4* More results in Results Review 26.4*   PLATELETS  --   --   --  182  --  171   More results in Results Review = values in this interval not displayed.      Recent Labs  Lab 11/12/15  0517 11/11/15  2320  11/10/15  0424   PT 17.4* 15.7* More results in Results Review 16.7*   PT INR 1.4* 1.2* More results in Results Review 1.4*   PTT  --   --   --  40*   More results in Results Review = values in this interval not displayed.   Recent Labs  Lab 11/12/15  0517 11/11/15  0557   SODIUM 140 137   POTASSIUM 4.1 4.1   CHLORIDE 106 102   CO2 21* 21*   BUN 33.0* 27.0   CREATININE 8.6* 7.2*   CALCIUM 6.7* 7.0*   GLUCOSE 98 83                 Signed by: Verdene Lennert, MD

## 2015-11-12 NOTE — Plan of Care (Signed)
Problem: Pain  Goal: Pain at adequate level as identified by patient  Outcome: Progressing  Pt. Resting calmly overnight but c/o 10 out of 10 abdominal pain every 3 hours when he is able to get his PRN pain medication, he has PO and IV dilaudid ordered, he refuses the PO stating that "it takes to long to work" and he "is already taking too many pills", he will sleep or watch tv between doses and never asks for additional pain med's but calls the nurse exactly every 3 hrs asking for his dilaudid, it was given x 3 total overnight.    Comments:   Pt. Is alert and oriented, VSS on the monitor, slightly HTN at the start of the shift but he refused his coreg stating that "his BP was fine" even after medication parameters and need explained, his SBP was 149 at the time and when repeated it was in the 170's however he still refused, he has been afebrile and has had no distress on RA, PD drain is WNL - he c/o abdominal pain at the start of this shift saying more fluid needed to be drained, Dr.Tandra paged and order given to drain 1 more additional liter of fluid off from PD drain, the clamp broke while attempted to clamp it at the end so 1200 mls drained before the pt. And myself were able to get the drain clamped off, he had x 1 BM overnight - per the pt. Is was large and liquid brown, he is anuric, he is tolerating his renal diet, no acute issues or events overnight, serial labs sent as ordered.

## 2015-11-12 NOTE — Progress Notes (Signed)
[facility], [employer].  Progress Note    Assessment:   ESRD on HD - Poor compliance. Clotted AVF for several weeks. Current access is a left sided TDC   HTN with CKD   Ascites - ?Dialysis related due to poor compliance. Has a [place] catheter (placed 3 weeks ago). Due to his compliance issues he is not a candidate for PD.   Anemia of CKD    History of C. Diff colitis    Medical non compliance     Plan:   Patient refusing HD today. Agrees to HD tomorrow   Discussed the issues of blood tinged PD fluid with the patient, he wants to keep the PD catheter for now   Will have the PD catheter accessed today by the dialysis nurses to drain some ascitic fluid today   He is refusing his antihypertensives    Marc Sine, MD  Office - [phone]  [employer] - [phone] or Tiger[employer]  ++++++++++++++++++++++++++++++++++++++++++++++++++++++++++++++  Subjective:  Nausea, diarrhea    Medications:  Scheduled Meds:  Current Facility-Administered Medications   Medication Dose Route Frequency   . amLODIPine  10 mg Oral Daily   . carvedilol  25 mg Oral Q12H SCH   . darbepoetin alfa  60 mcg Subcutaneous Weekly   . piperacillin-tazobactam  2.25 g Intravenous Q8H SCH   . sevelamer  1,600 mg Oral TID MEALS   . vancomycin  250 mg Oral 4 times per day     Continuous Infusions:   PRN Meds:albuterol, heparin (porcine), hydrALAZINE, HYDROmorphone, HYDROmorphone, ondansetron    Objective:  Vital signs in last 24 hours:  Temp:  [97.8 F (36.6 C)-99.3 F (37.4 C)] 97.8 F (36.6 C)  Heart Rate:  [90-106] 99  Resp Rate:  [13-25] 20  BP: (143-187)/(72-100) 170/92 mmHg  Intake/Output last 24 hours:    Intake/Output Summary (Last 24 hours) at 11/12/15 1720  Last data filed at 11/12/15 1700   Gross per 24 hour   Intake    860 ml   Output   2175 ml   Net  -1315 ml     Intake/Output this shift:  I/O this shift:  In: 490 [P.O.:340; I.V.:50; IV Piggyback:100]  Out: -     Physical Exam:   Gen: WD AA M NAD   CV: S1 S2 N  RRR   Chest: CTAB   Ab: +Distension, PD catheter   Ext: No C/E    Labs:    Recent Labs  Lab 11/12/15  0517 11/11/15  0557 11/10/15  0424   GLUCOSE 98 83 71   BUN 33.0* 27.0 37.0*   CREATININE 8.6* 7.2* 9.1*   CALCIUM 6.7* 7.0* 7.4*   SODIUM 140 137 139   POTASSIUM 4.1 4.1 5.1   CHLORIDE 106 102 106   CO2 21* 21* 20*       Recent Labs  Lab 11/12/15  1339 11/12/15  0517 11/11/15  1714 11/11/15  0557  11/10/15  0424 11/09/15  2247   WBC  --   --   --  6.18  --  6.75 6.30   HGB 8.1* 8.1* 8.2* 8.2* More results in Results Review 8.3* 8.4*   HEMATOCRIT 26.1* 25.8* 26.4* 26.4* More results in Results Review 26.4* 26.5*   MCV  --   --   --  94.6  --  95.3 95.0   MCH  --   --   --  29.4  --  30.0 30.1   MCHC  --   --   --  31.1*  --  31.4* 31.7*   RDW  --   --   --  16*  --  16* 16*   MPV  --   --   --  10.2  --  10.1 9.7   PLATELETS  --   --   --  182  --  171 163   More results in Results Review = values in this interval not displayed.

## 2015-11-12 NOTE — Significant Event (Addendum)
Assumed care of patient around 1930, prior to entering the patients room around 2000 I asked him if he needed anything before I came in, he asked for his "dilaudid", I informed him that the order had been changed to Q6 hrs and that his next dose was not due until around 2200, he became agitated and said "then Im going home", I asked him why since he had agreed with the social work earlier to stay the night and reassess in the morning, he said "they promised not to change my pain meds if I stayed", I asked what I could do to help, he said that "we are fixated on dialysis instead of solving the problem of why he is having abdominal pain", I then asked him what he suggests we do that we are not doing, because everything we have offered he keeps refusing, he told me "I dont' know what to tell you to do", I asked him to please stay and try dialysis and taking his med's that maybe if he tried that plan it might make a difference, he continued to tell me that the hospital doesn't know what they are doing, I notified Jae Dire the Civil engineer, contracting, Dr.Ewgaly the hospitalist and I spoke with Dr.Wang the nephrologist who said to leave his PD and HD tunneled cath in, the pt. Refused to sign the AMA form as witnessed by myself and Melissa the charge nurse, his monitor equipment was removed as well as his IV and he was left to leave the premises, he packed his belongings and exited through the visitor entrance to MSICU around 2041, pt. Was encouraged to stay just prior to leaving and it was verified with him that he is leaving AMA, the pt. Confirmed and left.

## 2015-11-12 NOTE — Progress Notes (Signed)
Social worker met with patient as he is threatening to leave AMA.  SW explored patient's distress.  Patient states "They are focused on the things they shouldn't and not on the things that matter."    Asked to specify ; patient states they are focused on HD, and what he needs is his unexplained abdominal pain figured out/fixed and medicated in interim (IV dilaudid) and on giving him paracentesis.  He states that he did dialysis 5 Xs last week; to no effect; he needs paracentesis.    Per attending direction to transition to oral meds and decreasing doses of dilaudid he also has issue "I just want them to fix things, so I can go home with nothing."    SW acknowledged his frustration and gained his agreement to remain in hospital overnight.  Will revisit with him tomorrow.    Summarily met with bedside nurse and read recent notes which capture his ongoing refusal of all elements of medical treatment.  Per report and other hospital notes, his directing care/refusing and focus on meds is quite likely consistent with drug seeking behavior as noted by other facilities in past.     Expect medical staff to reiterate to patient the recommendation of treatment; documentation of his refusal; patient may be discharged or go AMA.    Angus Seller, MSW  MSICU/NSICU Clinical Social Worker  [phone]

## 2015-11-13 LAB — LAB USE ONLY - HISTORICAL NON-GYN MEDICAL CYTOLOGY

## 2015-11-17 NOTE — Discharge Summary (Signed)
MEDICINE DISCHARGE SUMMARY    Date Time: 11/17/2015 11:25 AM  Patient Name: [name]  Attending Physician: No att. providers found  Primary Care Physician: Christa See, MD    Date of Admission: 11/09/2015  Date of Discharge: 11/12/2015    Discharge Diagnoses:     Nausea/vomiting  Acute on chronic anemia  Cdiff  ESRD on HD  CAD s/p PCI      Disposition:      Pt left AMA    Pending Results, Recommendations & Instructions to providers after discharge:     1. Micro / Labs / Path pending:   Unresulted Labs     Procedure . . . Date/Time    Prothrombin time/INR [[id number]] Resulted:  11/10/15 0519    Specimen Information:  Blood Updated:  11/10/15 0519        2. Wound Care Instructions:   3. Date of completion for antibiotics or other medications:   4. Other:     Recent Labs - Last 2:         Recent Labs  Lab 11/12/15  1339 11/12/15  0517  11/11/15  0557   WBC  --   --   --  6.18   HGB 8.1* 8.1* More results in Results Review 8.2*   HEMATOCRIT 26.1* 25.8* More results in Results Review 26.4*   PLATELETS  --   --   --  182   More results in Results Review = values in this interval not displayed.      Recent Labs  Lab 11/12/15  0517   PT 17.4*   PT INR 1.4*            Recent Labs  Lab 11/12/15  0517 11/11/15  0557   SODIUM 140 137   POTASSIUM 4.1 4.1   CHLORIDE 106 102   CO2 21* 21*   BUN 33.0* 27.0   GLUCOSE 98 83   CALCIUM 6.7* 7.0*                 Invalid input(s): FREET4         Procedures/Radiology performed:   Ct Abdomen Pelvis Wo Iv/ Wo Po Cont    11/10/2015   1. Large intraperitoneal ascites. The majority of this is simple low fluid attenuation however a small fluid hematocrit level is seen in the dependent pelvis indicating small component of hemoperitoneum. 2. Trace pleural effusions and basilar atelectasis. Jorge Lose, MD 11/10/2015 12:19 PM     Xr Abdomen 2 View With Cxr    10/19/2015  No acute process. ReadingStation:[email or web address]      Hospital Course:     Reason for admission/ HPI: [name]  is a 45 y.o. male who presents to the hospital with 6 days of nausea, vomiting, diarrhea, and decreased appetite, as well as a 5 day history of draining blood from PD catheter. He initially presented to an outside hospital all with these symptoms before being transferred here. He has a history of hypertension, CAD s/p PCI, ESRD on HD MWF at [facility] in [place] and history of a c diff colitis. He has been on HD for about 7 years through a left-sided fistula. He has been having issues with this fistula sp about a month ago had a PD catheter placed as he was supposed to be transitioned back to peritoneal dialysis.This on review of the D/C summary from [facility] hosiptal where he was hospitalized from 10/19/15 until last week from where he left AMA due  to complaints of inadequate pain control, shows that he had PD catheter placed to augment his HD as the ascites was thought to be due to inadequate HD sessions with patient interrupting the sessions due to alleged LUE pain complaints.He had hemoperitoneum following the placement of this catheter at the OSH which required a brief stay in the ICU to receive ~6 U of PRBC transfusion. He was evaluated by Cardiology and Nephrology during the stay and both consultants were of the opinion that his ascites was HD related. On Wednesday he noted bloody drainage from his PD catheter. He has had ascites since about November 2016, and has had periodic large volume paracentesis , which has been increasingly required more frequently, last treatment was at the [facility] hospital at some point around the time of placement of PD catheter with removal of 4L of fluid. He had also been treated for C. Diff colitis at the peripheral hospital during the recent stay although its reported that his stool test was negative for the same. He supposedly also is in need of fecal transplant treatment for the recurrent C diff colitis but has not been able to get this done due to unknown reason. About a  month ago, he also was treated for C. difficile colitis after being on antibiotics for pneumonia with Flagyl and oral vancomycin.     At the OSH, he received vanc/zosyn and had blood and PD fluid cultures. He was transferred here for further evaluation. He usually receives his care at [facility]    Currently, he feels hungry. He is on oxygen but usually only needs it when he is getting dialysis. He is not short of breath or having chest pain. He admits to having abdominal pain which improves with dilaudid. His abdomen and both legs are swollen. The skin on his abdomen is also itchy.      Hospital Course:   45yo male with multiple recent complicated hospitalizations recent AMA discharge now with nausea/vomiting and abdominal pain.  He was also noted to have bloody drainage from PD catheter.  Recurrent abdominal ascites secondary to incomplete HD.   There was concern for drug seeking behavior; has left multiple hospitals AMA in past month.He was being followed by renal and they did not feel he was a candidate for PD due to non-compliance.  During his hospitalization, pt refused his medications and at times HD.  When I tried to taper off the IV dilaudid, pt became very upset and then left AMA later in the evening.  Case had been discussed with [name].        [name], please refer to Jorge Connors, [name], which is dated 10/23/2015. In summary, [name] is a 45 y.o. male who presented with abdomen pain and diarrhea    Hospital Course:   [name] was admitted to [facility] on 10/23/2015.    Recurrent diarrhea onging for few months. -stool negative for C. Diff tox, stool culture was negative. He was placed on empiric po vancomycin. He still had continued diarrhea and was seen by [name] and was started on xifaxan for possible small bacterial  overgrowth.    Ascites-HD related, he has a hx of PD in the past and may have altered permeability of his peritoneum; also due to incomplete HD sessions in the past due to him interrupting them due  to alledged LUE pain, he developed anasarca , a PD cath was placed by Jorge. Hortencia Pilar to manage his ascites at home and to consider PD in the future with his nephrologist in [place]; here we placed a HD L sided PC to get full HD sessions, due to persitent subjective pain in LUE AVF-which was functioning ok. Post PD Catheter placement patient had hemoperitoneum and was transferred to the intensive care unit where he required blood transfusion for anemia. He was stabilized and was transferred out of the ICU. He has been undergoing ultrafiltration and dialysis per the nephrology team. Echo was noted to have Mild mod pHTN and mild/ mod MR, pt can't do cardiac MRI due to need for contrast for it, but given concern here is more for incomplete HD over past number of months than a true restrictive CM, he needs a trial of proper HD sessions before any further cardiac w/o to be done; his 2d echo findings would not explain there degree of ascites though, but incomplete HD would. He was seen by cardiology and did not think primary cardiac process for the ascites. The ascitic fluid was transudative in nature and path was negative for malignant cells. Nephrology team felt the ascites was hemodialysis related. He was seen by [name] from gastroenterology and recommended liver/spleen scan to complete the work up which was not done as patient left AMA.    Patient c/o pain extremities and abdomen and was on iv dilaudid. There was concern for narcotic seeking behaviour as noted in the documentation by the previous hospitalist. Patient was reluctant to consider oral regimen for pain. When the dosage of the IV dilaudid was changed to every 4 hrs along with oral dilaudid, patient was very upset and left AMA. He understood the risk including death if  he left AMA.      [facility] system - [address] [place], Admission 6/26 - 6/30  ADMITTING DIAGNOSES & HOSPITAL COURSE:   This is a 45 year old man with a past medical history significant for end-stage renal disease on   hemodialysis, hypertension, recurrent C difficile, and asthma, who was in his   usual state of health until a week prior to admission, when he started having increased abdominal pain, distension and diarrhea. Patient has a history of recurrent C. Diff. He was admitted to [facility]. Per patient, the test for C. Diff was positive and he was started on treatment. However, he did not like the service, so left the hospital and came to [facility] instead.     The patient also complains of recurrent abdominal ascites since January this year. He reports he had a liver biopsy in the past and he was told he has not liver disease.     Patient reports two episodes of spontaneous bacterial peritonitis in the past.     Diarrhea: resolved  - seems resolved, patient had a formed BM earlier today.   - sample not accepted to be tested for C. Diff;  - second BM loose, C. Diff negative.  - will attempt to get records/Discharge Summary from [facility].   - empirically on PO Vancomycin and IV Flagyl.  - 6/28: per GI, recommend a long Vancomycin taper in the setting of refractory C. Diff. ID following. Currently on PO Flagyl, continue.   - 6/30: no more diarrhea.     ESRD:  - HD dependent;   - Nephrology consulted: last dialysis on Friday, missed dialysis yesterday;  - 6/28: Dialysis was attempted using the Left  arm AV fistula, but not completed because of pain/ no flow. [place] catheter was placed in right groin this afternoon.  - Plan per Nephrology: daily HD x3.  - Fistulogram 6/29: no stenosis.   - 6/30: Dialysis yesterday and about 1 h today, interrupted due to increased SOB. Per report, the flow was also slow through the [place].   - Fluid restriction.   - Remove Quinton prior to discharge.     HTN:  - uncontrolled;  -  patient states that his BP drops with Dialysis, so we will monitor for now;   - resume home BP medications when able;   - patient has occasionally refused to take his BP medications.     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   - patient can f/u with Hepatology as outpatient, but the impression is that the ascites is probably due to lack of efficient fluid removal.    Asthma:   - stable, no SOB;    Anemia:  - most likely anemia of chronic disease.   - management per Nephrology.     Nicotine addiction:  - patient was counseled on admission;   - declined Nicotine patch;     Patient was discharged to home in stable condition.   He was urged to resume his usual dialysis schedule as outpatient.     Rondel Oh [facility] 6/8-6/9:   Hospital Course:  The patient is a 45 year old gentleman with history of stage V end-stage renal disease on hemodialysis, pulmonary hypertension, chronic systolic heart failure, hypertension, coronary disease, chronic anemia, prior C. difficile, who was admitted overnight for symptoms of abdominal pain, diarrhea, shortness of breath. He was found to have volume overload with pulmonary edema and ascites. In addition has prior history of C. difficile and presented with diarrhea. C. difficile toxin ordered but no stool collected. His additionally found to have acute on chronic anemia with hemoglobin of 7, hypocalcemia, elevated creatinine.    The patient was arranged for nephrology consult this morning and plan was for hemodialysis. I was paged by the nursing staff at approximately [phone] that the patient wished to leave against medical advice. I came and spoke with the patient at the bedside. He was calm and rational. He stated that he wished to leave  the hospital so he could travel back to [place] and make his regularly scheduled dialysis time. I asked him if we had done something to upset him and he said "no, to the contrary everyone has been nice." He he denies having further stool since admission. He denies abdominal pain and short of breath this time. I discussed with him that I felt it was a poor decision to leave AMA at this time to travel back to [facility] and have dialysis later this afternoon. I discussed with him that he had a low blood count with a hemoglobin of 7, that he had elevated potassium of 5.2, and a low calcium. I spent to him that these abnormal values could lead to arrhythmia and death. I stated that Jorge. Charlton Haws had been in this morning and that we could have him dialyzed this morning and then if he felt well may be he could be discharged this afternoon. He declined this and stated he just wanted to  leave this morning. We reviewed the risks in doing so and he verbalized understanding. I asked that he talk to his dialysis staff about receiving blood and having his labs redrawn. He signed the [facility] form.      Discharge Day Exam:   Refused      Consultations:     Treatment Team:   Consulting Physician: Verdene Lennert, MD    Discharge Condition:     Left ama    Discharge Instructions & Follow Up Plan for Patient:       Patient was instructed to follow up with:     Follow-up Information     Follow up with Pcp, Noneorunknown, MD .                (FYI: you must refresh the link after final D/C Med reconciliation)    Immunizations provided:         [facility]  Department of Medicine  P: [phone]  F: [phone]    Signed by: Verdene Lennert, MD    CC: Christa See, MD

## 2015-11-21 ENCOUNTER — Emergency Department: Admit: 2015-11-22 | Payer: MEDICARE

## 2015-11-21 DIAGNOSIS — K659 Peritonitis, unspecified: Secondary | ICD-10-CM

## 2015-11-21 NOTE — ED Triage Notes (Signed)
Triage: Patient arrives ambulatory from home with c/o abdominal pain, nausea, vomiting and diarrhea since Monday. Patient reports hx cdiff. Patient also has peritoneal drain that was placed d/t recurrent ascites which he reports is putting out dark red blood.

## 2015-11-21 NOTE — ED Provider Notes (Signed)
HPI Comments: 45 y.o. male with past medical history significant for ascites, CKD (stage V), C. Diff, HTN, asthma, and SBP who presents with chief complaint of abdominal pain.  Patient c/o left-sided abdominal pain with associated nausea/vomiting for the past 5 days.  He additionally mentions fever and "watery" diarrhea.  Patient currently has peritoneal catheter in place to "drain his ascites."  Patient states that his ascites has appeared "bloody" and "thicker."  Patient denies any h/o liver disease.  Patient has had chronic ascites for the past 7 months.  There are no other acute medical concerns at this time.    Social hx: +tobacco smoker; denies EtOH    Dialysis:  David [name] is currently a hemodialysis patient.   Is He a new dialysis patient (< 6 months)?  No.  Current schedule is Monday/Wednesday/Friday   Current access location is left subclavian.     Note written by Luanna Cole, Scribe, as dictated by Raynald Kemp, MD 9:15 PM    The history is provided by the patient.        Past Medical History:   Diagnosis Date   ??? Ascites    ??? Asthma    ??? C. difficile colitis    ??? CKD (chronic kidney disease) stage V requiring chronic dialysis (HCC)    ??? HTN (hypertension)    ??? SBP (spontaneous bacterial peritonitis) (HCC)        History reviewed. No pertinent surgical history.      History reviewed. No pertinent family history.    Social History     Social History   ??? Marital status: UNKNOWN     Spouse name: N/A   ??? Number of children: N/A   ??? Years of education: N/A     Occupational History   ??? Not on file.     Social History Main Topics   ??? Smoking status: Heavy Tobacco Smoker     Packs/day: 0.50   ??? Smokeless tobacco: Never Used   ??? Alcohol use No   ??? Drug use: No   ??? Sexual activity: Not on file     Other Topics Concern   ??? Not on file     Social History Narrative         ALLERGIES: Lisinopril; Morphine; and Toradol [ketorolac]    Review of Systems    Constitutional: Positive for fever. Negative for appetite change and chills.   HENT: Negative for rhinorrhea, sore throat and trouble swallowing.    Eyes: Negative for photophobia.   Respiratory: Negative for cough and shortness of breath.    Cardiovascular: Negative for chest pain and palpitations.   Gastrointestinal: Positive for abdominal distention, abdominal pain, diarrhea, nausea and vomiting.   Genitourinary: Negative for dysuria, frequency and hematuria.   Musculoskeletal: Negative for arthralgias.   Neurological: Negative for dizziness, syncope and weakness.   Psychiatric/Behavioral: Negative for behavioral problems. The patient is not nervous/anxious.    All other systems reviewed and are negative.      Vitals:    11/21/15 2042   BP: 155/88   Pulse: (!) 108   Resp: 18   Temp: 100 ??F (37.8 ??C)   SpO2: 99%   Weight: 101.2 kg (223 lb)   Height:  (1.854 m)            Physical Exam   Constitutional: He appears well-developed and well-nourished.   HENT:   Head: Normocephalic and atraumatic.   Mouth/Throat: Oropharynx is clear and moist.  Eyes: EOM are normal. Pupils are equal, round, and reactive to light.   Neck: Normal range of motion. Neck supple.   Cardiovascular: Normal rate, regular rhythm, normal heart sounds and intact distal pulses.  Exam reveals no gallop and no friction rub.    No murmur heard.  Pulmonary/Chest: Effort normal. No respiratory distress. He has no wheezes. He has no rales.   Abdominal: Soft. He exhibits distension and ascites. There is tenderness. There is no rebound.   Diffuse abdominal tenderness, greatest at LLQ. Dressing overlying peritoneal catheter.   Musculoskeletal: Normal range of motion. He exhibits no tenderness.   Neurological: He is alert. No cranial nerve deficit.   Motor; symmetric   Skin: No erythema.   Left subclavian dialysis catheter.   Psychiatric: He has a normal mood and affect. His behavior is normal.   Nursing note and vitals reviewed.      Note written by Luanna Cole, Scribe, as dictated by Raynald Kemp, MD 9:15 PM    MDM  ED Course       Procedures      Note: Patient has a low-grade fever. Most of his pain is left lower quadrant but he also has diffuse abdominal pain and tenderness. He is dialyzed through a left subclavian area  dialysis catheter. He has a peritoneal catheter that drains off ascites twice a week. Peritoneal fluid has become bloody. Peritoneal fluid WBC is 824. Patient clearly has peritonitis. He'll be started on IV vancomycin and Zosyn. Since the peritoneal catheter will be pulled I am not ordering antibiotics through the peritoneal catheter at this time. It is Friday and patient will not be dialyzed until Monday probably. He was given normal saline 500 mL. He may require further IV fluids but I am not ordering them at this time. Patient has left lower quadrant pain and history of C. difficile. I believe the C. difficile DNA test is pending. Patient's diarrhea is hard to explain from peritonitis so he may have a second process.  Raynald Kemp, MD  11:00 PM         CONSULT NOTE:  Spoke to Dr Thad Ranger concerning the patient. The patient's history, presentation, physical findings, and results were all discussed.   11:21 PM

## 2015-11-22 ENCOUNTER — Inpatient Hospital Stay
Admit: 2015-11-22 | Discharge: 2015-11-22 | Discharge status: Against Medical Advice | Payer: MEDICARE | Attending: Emergency Medicine | Admitting: Emergency Medicine

## 2015-11-22 LAB — CBC WITH AUTOMATED DIFF
ABS. BASOPHILS: 0 10*3/uL (ref 0.0–0.1)
ABS. EOSINOPHILS: 0.5 10*3/uL — ABNORMAL HIGH (ref 0.0–0.4)
ABS. LYMPHOCYTES: 0.5 10*3/uL — ABNORMAL LOW (ref 0.8–3.5)
ABS. MONOCYTES: 0.5 10*3/uL (ref 0.0–1.0)
ABS. NEUTROPHILS: 6.3 10*3/uL (ref 1.8–8.0)
BASOPHILS: 0 % (ref 0–1)
EOSINOPHILS: 7 % (ref 0–7)
HCT: 25.8 % — ABNORMAL LOW (ref 36.6–50.3)
HGB: 7.9 g/dL — ABNORMAL LOW (ref 12.1–17.0)
LYMPHOCYTES: 7 % — ABNORMAL LOW (ref 12–49)
MCH: 28.3 PG (ref 26.0–34.0)
MCHC: 30.6 g/dL (ref 30.0–36.5)
MCV: 92.5 FL (ref 80.0–99.0)
MONOCYTES: 6 % (ref 5–13)
NEUTROPHILS: 80 % — ABNORMAL HIGH (ref 32–75)
PLATELET: 208 10*3/uL (ref 150–400)
RBC: 2.79 M/uL — ABNORMAL LOW (ref 4.10–5.70)
RDW: 16.3 % — ABNORMAL HIGH (ref 11.5–14.5)
WBC: 7.8 10*3/uL (ref 4.1–11.1)

## 2015-11-22 LAB — METABOLIC PANEL, COMPREHENSIVE
A-G Ratio: 0.5 — ABNORMAL LOW (ref 1.1–2.2)
ALT (SGPT): 9 U/L — ABNORMAL LOW (ref 12–78)
AST (SGOT): 16 U/L (ref 15–37)
Albumin: 2.3 g/dL — ABNORMAL LOW (ref 3.5–5.0)
Alk. phosphatase: 274 U/L — ABNORMAL HIGH (ref 45–117)
Anion gap: 10 mmol/L (ref 5–15)
BUN/Creatinine ratio: 5 — ABNORMAL LOW (ref 12–20)
BUN: 43 MG/DL — ABNORMAL HIGH (ref 6–20)
Bilirubin, total: 0.4 MG/DL (ref 0.2–1.0)
CO2: 24 mmol/L (ref 21–32)
Calcium: 6.5 MG/DL — ABNORMAL LOW (ref 8.5–10.1)
Chloride: 102 mmol/L (ref 97–108)
Creatinine: 8.91 MG/DL — ABNORMAL HIGH (ref 0.70–1.30)
GFR est AA: 8 mL/min/{1.73_m2} — ABNORMAL LOW (ref >60)
GFR est non-AA: 7 mL/min/{1.73_m2} — ABNORMAL LOW (ref >60)
Globulin: 4.4 g/dL — ABNORMAL HIGH (ref 2.0–4.0)
Glucose: 97 mg/dL (ref 65–100)
Potassium: 4.1 mmol/L (ref 3.5–5.1)
Protein, total: 6.7 g/dL (ref 6.4–8.2)
Sodium: 136 mmol/L (ref 136–145)

## 2015-11-22 LAB — CELL COUNT, BODY FLUID
FLD LYMPHS: 9 %
FLD MONO/MACROPHAGES: 45 %
FLD NEUTROPHILS: 46 %
FLUID RBC CT.: >100 /mm3
FLUID WBC COUNT: 812 /mm3 — ABNORMAL HIGH (ref 0–5)

## 2015-11-22 LAB — LACTIC ACID: Lactic acid: 0.8 MMOL/L (ref 0.4–2.0)

## 2015-11-22 MED ORDER — VANCOMYCIN IN 0.9 % SODIUM CHLORIDE 2 GRAM/500 ML IV
2 gram/500 mL | INTRAVENOUS | Status: AC
Start: 2015-11-22 — End: 2015-11-22
  Administered 2015-11-22: 04:00:00 via INTRAVENOUS

## 2015-11-22 MED ORDER — SODIUM CHLORIDE 0.9 % IJ SYRG
Freq: Three times a day (TID) | INTRAMUSCULAR | Status: DC
Start: 2015-11-22 — End: 2015-11-22

## 2015-11-22 MED ORDER — ONDANSETRON (PF) 4 MG/2 ML INJECTION
4 mg/2 mL | INTRAMUSCULAR | Status: AC
Start: 2015-11-22 — End: 2015-11-21
  Administered 2015-11-22: 02:00:00 via INTRAVENOUS

## 2015-11-22 MED ORDER — HYDROMORPHONE (PF) 1 MG/ML IJ SOLN
1 mg/mL | INTRAMUSCULAR | Status: AC
Start: 2015-11-22 — End: 2015-11-21
  Administered 2015-11-22: 03:00:00 via INTRAVENOUS

## 2015-11-22 MED ORDER — ADV ADDAPTOR
3.375 gram | Freq: Two times a day (BID) | Status: DC
Start: 2015-11-22 — End: 2015-11-22

## 2015-11-22 MED ORDER — SODIUM CHLORIDE 0.9% BOLUS IV
0.9 % | Freq: Once | INTRAVENOUS | Status: AC
Start: 2015-11-22 — End: 2015-11-21
  Administered 2015-11-22: 02:00:00 via INTRAVENOUS

## 2015-11-22 MED ORDER — HYDROMORPHONE (PF) 1 MG/ML IJ SOLN
1 mg/mL | Freq: Once | INTRAMUSCULAR | Status: AC
Start: 2015-11-22 — End: 2015-11-21
  Administered 2015-11-22: 02:00:00 via INTRAVENOUS

## 2015-11-22 MED ORDER — PIPERACILLIN-TAZOBACTAM 3.375 GRAM IV SOLR
3.375 gram | Freq: Once | INTRAVENOUS | Status: DC
Start: 2015-11-22 — End: 2015-11-22

## 2015-11-22 MED ORDER — PHARMACY VANCOMYCIN NOTE
Status: DC
Start: 2015-11-22 — End: 2015-11-22

## 2015-11-22 MED ORDER — ADV ADDAPTOR
3.375 gram | Status: AC
Start: 2015-11-22 — End: 2015-11-21
  Administered 2015-11-22: 03:00:00 via INTRAVENOUS

## 2015-11-22 MED ORDER — SODIUM CHLORIDE 0.9 % IJ SYRG
INTRAMUSCULAR | Status: DC | PRN
Start: 2015-11-22 — End: 2015-11-22

## 2015-11-22 MED ORDER — SODIUM CHLORIDE 0.9% BOLUS IV
0.9 % | Freq: Once | INTRAVENOUS | Status: DC
Start: 2015-11-22 — End: 2015-11-21

## 2015-11-22 MED FILL — PHARMACY VANCOMYCIN NOTE: Qty: 1

## 2015-11-22 MED FILL — VANCOMYCIN IN 0.9 % SODIUM CHLORIDE 2 GRAM/500 ML IV: 2 gram/500 mL | INTRAVENOUS | Qty: 500

## 2015-11-22 MED FILL — SODIUM CHLORIDE 0.9 % IV: INTRAVENOUS | Qty: 1000

## 2015-11-22 MED FILL — ONDANSETRON (PF) 4 MG/2 ML INJECTION: 4 mg/2 mL | INTRAMUSCULAR | Qty: 2

## 2015-11-22 MED FILL — HYDROMORPHONE (PF) 1 MG/ML IJ SOLN: 1 mg/mL | INTRAMUSCULAR | Qty: 1

## 2015-11-22 MED FILL — MONOJECT PREFILL ADVANCED 0.9 % SODIUM CHLORIDE INJECTION SYRINGE: INTRAMUSCULAR | Qty: 10

## 2015-11-22 MED FILL — PIPERACILLIN-TAZOBACTAM 3.375 GRAM IV SOLR: 3.375 gram | INTRAVENOUS | Qty: 3.38

## 2015-11-22 MED FILL — SODIUM CHLORIDE 0.9 % IV: INTRAVENOUS | Qty: 500

## 2015-11-22 NOTE — ED Notes (Signed)
Pt unpleased with pain treatment regimen discussed with hospitalist, wishing to leave AMA.  Offered to have the hospitalist discuss the matter with the patient which he declined.  Pt signed AMA form aware of risks nontreatment, Dr. Thad Ranger aware, VSS.

## 2015-11-25 LAB — CULTURE, BODY FLUID W GRAM STAIN
Culture result:: NO GROWTH
GRAM STAIN: NONE SEEN

## 2015-11-26 LAB — CULTURE, BLOOD, PAIRED: Culture result:: NO GROWTH

## 2015-12-17 ENCOUNTER — Emergency Department: Payer: Medicare Other

## 2015-12-17 ENCOUNTER — Inpatient Hospital Stay
Admission: EM | Admit: 2015-12-17 | Discharge: 2015-12-18 | DRG: 177 | Discharge status: Against Medical Advice | Payer: Medicare Other | Attending: Internal Medicine | Admitting: Internal Medicine

## 2015-12-17 ENCOUNTER — Inpatient Hospital Stay: Payer: Medicare Other | Admitting: Internal Medicine

## 2015-12-17 ENCOUNTER — Inpatient Hospital Stay: Payer: Medicare Other

## 2015-12-17 DIAGNOSIS — Z87891 Personal history of nicotine dependence: Secondary | ICD-10-CM

## 2015-12-17 DIAGNOSIS — I10 Essential (primary) hypertension: Secondary | ICD-10-CM

## 2015-12-17 DIAGNOSIS — K219 Gastro-esophageal reflux disease without esophagitis: Secondary | ICD-10-CM | POA: Diagnosis present

## 2015-12-17 DIAGNOSIS — Z86711 Personal history of pulmonary embolism: Secondary | ICD-10-CM

## 2015-12-17 DIAGNOSIS — Z885 Allergy status to narcotic agent status: Secondary | ICD-10-CM

## 2015-12-17 DIAGNOSIS — Z9861 Coronary angioplasty status: Secondary | ICD-10-CM

## 2015-12-17 DIAGNOSIS — R112 Nausea with vomiting, unspecified: Secondary | ICD-10-CM | POA: Diagnosis present

## 2015-12-17 DIAGNOSIS — Z9115 Patient's noncompliance with renal dialysis: Secondary | ICD-10-CM

## 2015-12-17 DIAGNOSIS — I429 Cardiomyopathy, unspecified: Secondary | ICD-10-CM

## 2015-12-17 DIAGNOSIS — I2510 Atherosclerotic heart disease of native coronary artery without angina pectoris: Secondary | ICD-10-CM | POA: Diagnosis present

## 2015-12-17 DIAGNOSIS — J156 Pneumonia due to other aerobic Gram-negative bacteria: Principal | ICD-10-CM | POA: Diagnosis present

## 2015-12-17 DIAGNOSIS — I160 Hypertensive urgency: Secondary | ICD-10-CM | POA: Diagnosis present

## 2015-12-17 DIAGNOSIS — Y95 Nosocomial condition: Secondary | ICD-10-CM | POA: Insufficient documentation

## 2015-12-17 DIAGNOSIS — R079 Chest pain, unspecified: Secondary | ICD-10-CM

## 2015-12-17 DIAGNOSIS — D649 Anemia, unspecified: Secondary | ICD-10-CM | POA: Diagnosis present

## 2015-12-17 DIAGNOSIS — J45909 Unspecified asthma, uncomplicated: Secondary | ICD-10-CM | POA: Diagnosis present

## 2015-12-17 DIAGNOSIS — I272 Other secondary pulmonary hypertension: Secondary | ICD-10-CM | POA: Diagnosis present

## 2015-12-17 DIAGNOSIS — F1120 Opioid dependence, uncomplicated: Secondary | ICD-10-CM | POA: Diagnosis present

## 2015-12-17 DIAGNOSIS — R197 Diarrhea, unspecified: Secondary | ICD-10-CM | POA: Diagnosis present

## 2015-12-17 DIAGNOSIS — Z888 Allergy status to other drugs, medicaments and biological substances status: Secondary | ICD-10-CM

## 2015-12-17 DIAGNOSIS — R0789 Other chest pain: Secondary | ICD-10-CM

## 2015-12-17 DIAGNOSIS — J189 Pneumonia, unspecified organism: Secondary | ICD-10-CM | POA: Insufficient documentation

## 2015-12-17 DIAGNOSIS — Z765 Malingerer [conscious simulation]: Secondary | ICD-10-CM

## 2015-12-17 DIAGNOSIS — I120 Hypertensive chronic kidney disease with stage 5 chronic kidney disease or end stage renal disease: Secondary | ICD-10-CM | POA: Diagnosis present

## 2015-12-17 DIAGNOSIS — Z9981 Dependence on supplemental oxygen: Secondary | ICD-10-CM

## 2015-12-17 DIAGNOSIS — I252 Old myocardial infarction: Secondary | ICD-10-CM

## 2015-12-17 DIAGNOSIS — N186 End stage renal disease: Secondary | ICD-10-CM | POA: Diagnosis present

## 2015-12-17 DIAGNOSIS — A09 Infectious gastroenteritis and colitis, unspecified: Secondary | ICD-10-CM

## 2015-12-17 DIAGNOSIS — Z992 Dependence on renal dialysis: Secondary | ICD-10-CM

## 2015-12-17 DIAGNOSIS — Z9114 Patient's other noncompliance with medication regimen: Secondary | ICD-10-CM

## 2015-12-17 HISTORY — DX: Malingerer (conscious simulation): Z76.5

## 2015-12-17 HISTORY — DX: Abnormal result of cardiovascular function study, unspecified: R94.30

## 2015-12-17 LAB — COMPREHENSIVE METABOLIC PANEL
ALT: 7 U/L (ref 0–55)
AST (SGOT): 13 U/L (ref 5–34)
Albumin/Globulin Ratio: 0.8 — ABNORMAL LOW (ref 0.9–2.2)
Albumin: 2.6 g/dL — ABNORMAL LOW (ref 3.5–5.0)
Alkaline Phosphatase: 287 U/L — ABNORMAL HIGH (ref 38–106)
BUN: 31 mg/dL — ABNORMAL HIGH (ref 9.0–28.0)
Bilirubin, Total: 0.5 mg/dL (ref 0.2–1.2)
CO2: 24 mEq/L (ref 22–29)
Calcium: 6.8 mg/dL — ABNORMAL LOW (ref 8.5–10.5)
Chloride: 100 mEq/L (ref 100–111)
Creatinine: 8.3 mg/dL — ABNORMAL HIGH (ref 0.7–1.3)
Globulin: 3.3 g/dL (ref 2.0–3.6)
Glucose: 83 mg/dL (ref 70–100)
Potassium: 4.3 mEq/L (ref 3.5–5.1)
Protein, Total: 5.9 g/dL — ABNORMAL LOW (ref 6.0–8.3)
Sodium: 137 mEq/L (ref 136–145)

## 2015-12-17 LAB — CBC AND DIFFERENTIAL
Absolute NRBC: 0 10*3/uL
Basophils Absolute Automated: 0.03 10*3/uL (ref 0.00–0.20)
Basophils Automated: 0.3 %
Eosinophils Absolute Automated: 0.68 10*3/uL (ref 0.00–0.70)
Eosinophils Automated: 7.5 %
Hematocrit: 15.1 % — ABNORMAL LOW (ref 42.0–52.0)
Hgb: 4.7 g/dL — CL (ref 13.0–17.0)
Immature Granulocytes Absolute: 0.02 10*3/uL
Immature Granulocytes: 0.2 %
Lymphocytes Absolute Automated: 0.55 10*3/uL (ref 0.50–4.40)
Lymphocytes Automated: 6.1 %
MCH: 29.2 pg (ref 28.0–32.0)
MCHC: 31.1 g/dL — ABNORMAL LOW (ref 32.0–36.0)
MCV: 93.8 fL (ref 80.0–100.0)
MPV: 9.7 fL (ref 9.4–12.3)
Monocytes Absolute Automated: 0.81 10*3/uL (ref 0.00–1.20)
Monocytes: 9 %
Neutrophils Absolute: 6.95 10*3/uL (ref 1.80–8.10)
Neutrophils: 76.9 %
Nucleated RBC: 0 /100 WBC (ref 0.0–1.0)
Platelets: 178 10*3/uL (ref 140–400)
RBC: 1.61 10*6/uL — ABNORMAL LOW (ref 4.70–6.00)
RDW: 16 % — ABNORMAL HIGH (ref 12–15)
WBC: 9.04 10*3/uL (ref 3.50–10.80)

## 2015-12-17 LAB — ECG 12-LEAD
Atrial Rate: 120 {beats}/min
P Axis: 80 degrees
P-R Interval: 164 ms
Q-T Interval: 320 ms
QRS Duration: 78 ms
QTC Calculation (Bezet): 452 ms
R Axis: 8 degrees
T Axis: 87 degrees
Ventricular Rate: 120 {beats}/min

## 2015-12-17 LAB — IRON PROFILE
Iron Saturation: 24 % (ref 15–50)
Iron: 44 ug/dL (ref 40–160)
TIBC: 180 ug/dL — ABNORMAL LOW (ref 261–462)
UIBC: 136 ug/dL (ref 126–382)

## 2015-12-17 LAB — I-STAT CG4 VENOUS CARTRIDGE
Lactic Acid I-Stat: 1 mmol/L (ref 0.2–2.0)
i-STAT Base Excess Venous: 3 mEq/L
i-STAT FIO2: 21
i-STAT HCO3 Bicarbonate Venous: 26.9 mEq/L
i-STAT Liters Per Minute: 2
i-STAT O2 Saturation Venous: 86 %
i-STAT Patient Temperature: 99.4
i-STAT Total CO2 Venous: 28 mEq/L
i-STAT pCO2 Venous: 36.4
i-STAT pH Venous: 7.479
i-STAT pO2 Venous: 49

## 2015-12-17 LAB — TROPONIN I
Troponin I: 0.07 ng/mL (ref 0.00–0.09)
Troponin I: 0.07 ng/mL (ref 0.00–0.09)

## 2015-12-17 LAB — HEMOLYSIS INDEX: Hemolysis Index: 51 — ABNORMAL HIGH (ref 0–18)

## 2015-12-17 LAB — FERRITIN: Ferritin: 2003.4 ng/mL — ABNORMAL HIGH (ref 21.80–274.70)

## 2015-12-17 LAB — HEMOGLOBIN AND HEMATOCRIT, BLOOD
Hematocrit: 26.3 % — ABNORMAL LOW (ref 42.0–52.0)
Hgb: 8.1 g/dL — ABNORMAL LOW (ref 13.0–17.0)

## 2015-12-17 LAB — MAGNESIUM: Magnesium: 1.7 mg/dL (ref 1.6–2.6)

## 2015-12-17 LAB — HEPATITIS B SURFACE ANTIGEN W/ REFLEX TO CONFIRMATION: Hepatitis B Surface Antigen: NONREACTIVE

## 2015-12-17 LAB — GFR: EGFR: 8.5

## 2015-12-17 LAB — TYPE AND SCREEN
AB Screen Gel: NEGATIVE
ABO Rh: O POS

## 2015-12-17 LAB — I-STAT TROPONIN: i-STAT Troponin: 0.02 ng/mL (ref 0.00–0.09)

## 2015-12-17 LAB — CLOSTRIDIUM DIFFICILE TOXIN B PCR: Stool Clostridium difficile Toxin B PCR: NEGATIVE

## 2015-12-17 LAB — HEPATITIS B SURFACE ANTIBODY: HEPATITIS B SURFACE ANTIBODY: 54.25

## 2015-12-17 MED ORDER — NALOXONE HCL 0.4 MG/ML IJ SOLN (WRAP)
0.2000 mg | INTRAMUSCULAR | Status: DC | PRN
Start: 2015-12-17 — End: 2015-12-18

## 2015-12-17 MED ORDER — SEVELAMER CARBONATE 800 MG PO TABS
800.0000 mg | ORAL_TABLET | Freq: Three times a day (TID) | ORAL | Status: DC
Start: 2015-12-17 — End: 2015-12-18
  Administered 2015-12-17 – 2015-12-18 (×3): 800 mg via ORAL
  Filled 2015-12-17 (×6): qty 1

## 2015-12-17 MED ORDER — HYDRALAZINE HCL 50 MG PO TABS
100.0000 mg | ORAL_TABLET | Freq: Three times a day (TID) | ORAL | Status: DC
Start: 2015-12-17 — End: 2015-12-18
  Administered 2015-12-17 – 2015-12-18 (×3): 100 mg via ORAL
  Filled 2015-12-17 (×5): qty 2

## 2015-12-17 MED ORDER — DARBEPOETIN ALFA 100 MCG/0.5ML IJ SOSY
100.0000 ug | PREFILLED_SYRINGE | INTRAMUSCULAR | Status: DC
Start: 2015-12-17 — End: 2015-12-18
  Filled 2015-12-17: qty 0.5

## 2015-12-17 MED ORDER — ONDANSETRON 4 MG PO TBDP
4.0000 mg | ORAL_TABLET | Freq: Four times a day (QID) | ORAL | Status: DC | PRN
Start: 2015-12-17 — End: 2015-12-18

## 2015-12-17 MED ORDER — VANCOMYCIN HCL 1000 MG IV SOLR
2000.0000 mg | Freq: Once | INTRAVENOUS | Status: AC
Start: 2015-12-17 — End: 2015-12-17
  Administered 2015-12-17: 2000 mg via INTRAVENOUS
  Filled 2015-12-17: qty 2000

## 2015-12-17 MED ORDER — ALBUTEROL SULFATE (2.5 MG/3ML) 0.083% IN NEBU
5.0000 mg | INHALATION_SOLUTION | Freq: Once | RESPIRATORY_TRACT | Status: AC
Start: 2015-12-17 — End: 2015-12-17
  Administered 2015-12-17: 5 mg via RESPIRATORY_TRACT
  Filled 2015-12-17: qty 6

## 2015-12-17 MED ORDER — HYDROMORPHONE HCL 1 MG/ML IJ SOLN
0.4000 mg | INTRAMUSCULAR | Status: DC | PRN
Start: 2015-12-17 — End: 2015-12-18
  Administered 2015-12-17 – 2015-12-18 (×12): 0.4 mg via INTRAVENOUS
  Filled 2015-12-17 (×12): qty 1

## 2015-12-17 MED ORDER — PANTOPRAZOLE SODIUM 40 MG PO TBEC
40.0000 mg | DELAYED_RELEASE_TABLET | Freq: Every day | ORAL | Status: DC
Start: 2015-12-17 — End: 2015-12-18
  Administered 2015-12-18: 40 mg via ORAL
  Filled 2015-12-17 (×2): qty 1

## 2015-12-17 MED ORDER — ACETAMINOPHEN 325 MG PO TABS
650.0000 mg | ORAL_TABLET | Freq: Four times a day (QID) | ORAL | Status: DC | PRN
Start: 2015-12-17 — End: 2015-12-18

## 2015-12-17 MED ORDER — NITROGLYCERIN 2 % TD OINT
1.0000 [in_us] | TOPICAL_OINTMENT | Freq: Once | TRANSDERMAL | Status: AC
Start: 2015-12-17 — End: 2015-12-17
  Administered 2015-12-17: 1 [in_us] via TOPICAL
  Filled 2015-12-17: qty 1

## 2015-12-17 MED ORDER — HYDROMORPHONE HCL 1 MG/ML IJ SOLN
0.4000 mg | Freq: Once | INTRAMUSCULAR | Status: AC
Start: 2015-12-17 — End: 2015-12-17
  Administered 2015-12-17: 0.4 mg via INTRAVENOUS
  Filled 2015-12-17: qty 1

## 2015-12-17 MED ORDER — SODIUM CHLORIDE 0.9 % IV BOLUS
250.0000 mL | INTRAVENOUS | Status: AC | PRN
Start: 2015-12-17 — End: 2015-12-17

## 2015-12-17 MED ORDER — ASPIRIN 81 MG PO TBEC
81.0000 mg | DELAYED_RELEASE_TABLET | Freq: Every day | ORAL | Status: DC
Start: 2015-12-17 — End: 2015-12-18
  Administered 2015-12-18: 81 mg via ORAL
  Filled 2015-12-17 (×2): qty 1

## 2015-12-17 MED ORDER — SODIUM CHLORIDE 0.9 % IV MBP
2.2500 g | Freq: Three times a day (TID) | INTRAVENOUS | Status: DC
Start: 2015-12-17 — End: 2015-12-17
  Administered 2015-12-17: 2.25 g via INTRAVENOUS
  Filled 2015-12-17: qty 2.25

## 2015-12-17 MED ORDER — AMLODIPINE BESYLATE 5 MG PO TABS
10.0000 mg | ORAL_TABLET | Freq: Every day | ORAL | Status: DC
Start: 2015-12-17 — End: 2015-12-18
  Administered 2015-12-18: 10 mg via ORAL
  Filled 2015-12-17 (×2): qty 2

## 2015-12-17 MED ORDER — ALBUTEROL-IPRATROPIUM 2.5-0.5 (3) MG/3ML IN SOLN
3.0000 mL | Freq: Four times a day (QID) | RESPIRATORY_TRACT | Status: DC
Start: 2015-12-17 — End: 2015-12-18
  Administered 2015-12-17 – 2015-12-18 (×6): 3 mL via RESPIRATORY_TRACT
  Filled 2015-12-17 (×6): qty 3

## 2015-12-17 MED ORDER — NITROGLYCERIN 0.4 MG SL SUBL
0.4000 mg | SUBLINGUAL_TABLET | SUBLINGUAL | Status: DC | PRN
Start: 2015-12-17 — End: 2015-12-18
  Administered 2015-12-17: 0.4 mg via SUBLINGUAL
  Filled 2015-12-17: qty 1

## 2015-12-17 MED ORDER — METOPROLOL TARTRATE 25 MG PO TABS
100.0000 mg | ORAL_TABLET | Freq: Two times a day (BID) | ORAL | Status: DC
Start: 2015-12-17 — End: 2015-12-18
  Administered 2015-12-17 – 2015-12-18 (×2): 100 mg via ORAL
  Filled 2015-12-17 (×3): qty 4

## 2015-12-17 MED ORDER — ONDANSETRON HCL 4 MG/2ML IJ SOLN
4.0000 mg | Freq: Once | INTRAMUSCULAR | Status: AC
Start: 2015-12-17 — End: 2015-12-17
  Administered 2015-12-17: 4 mg via INTRAVENOUS
  Filled 2015-12-17: qty 2

## 2015-12-17 MED ORDER — HEPARIN SODIUM (PORCINE) 5000 UNIT/ML IJ SOLN
5000.0000 [IU] | Freq: Three times a day (TID) | INTRAMUSCULAR | Status: DC
Start: 2015-12-17 — End: 2015-12-18

## 2015-12-17 MED ORDER — SODIUM CHLORIDE 0.9 % IV BOLUS
100.0000 mL | INTRAVENOUS | Status: AC | PRN
Start: 2015-12-17 — End: 2015-12-17

## 2015-12-17 MED ORDER — SODIUM CHLORIDE 0.9 % IV MBP
4.5000 g | Freq: Once | INTRAVENOUS | Status: AC
Start: 2015-12-17 — End: 2015-12-17
  Administered 2015-12-17: 4.5 g via INTRAVENOUS
  Filled 2015-12-17: qty 20

## 2015-12-17 MED ORDER — IOHEXOL 350 MG/ML IV SOLN
100.0000 mL | Freq: Once | INTRAVENOUS | Status: AC | PRN
Start: 2015-12-17 — End: 2015-12-17
  Administered 2015-12-17: 90 mL via INTRAVENOUS

## 2015-12-17 MED ORDER — DARBEPOETIN ALFA 100 MCG/0.5ML IJ SOSY
100.0000 ug | PREFILLED_SYRINGE | INTRAMUSCULAR | Status: DC
Start: 2015-12-17 — End: 2015-12-17

## 2015-12-17 MED ORDER — DOXYCYCLINE HYCLATE 100 MG PO TABS
100.0000 mg | ORAL_TABLET | Freq: Two times a day (BID) | ORAL | Status: DC
Start: 2015-12-17 — End: 2015-12-18
  Administered 2015-12-17 – 2015-12-18 (×2): 100 mg via ORAL
  Filled 2015-12-17 (×4): qty 1

## 2015-12-17 MED ORDER — FUROSEMIDE 40 MG PO TABS
80.0000 mg | ORAL_TABLET | Freq: Every day | ORAL | Status: DC
Start: 2015-12-17 — End: 2015-12-18
  Administered 2015-12-18: 80 mg via ORAL
  Filled 2015-12-17 (×2): qty 2

## 2015-12-17 MED ORDER — HYDROMORPHONE HCL 1 MG/ML IJ SOLN
1.0000 mg | Freq: Once | INTRAMUSCULAR | Status: AC
Start: 2015-12-17 — End: 2015-12-17
  Administered 2015-12-17: 1 mg via INTRAVENOUS
  Filled 2015-12-17: qty 1

## 2015-12-17 MED ORDER — ONDANSETRON HCL 4 MG/2ML IJ SOLN
4.0000 mg | Freq: Four times a day (QID) | INTRAMUSCULAR | Status: DC | PRN
Start: 2015-12-17 — End: 2015-12-18
  Administered 2015-12-17: 4 mg via INTRAVENOUS
  Filled 2015-12-17: qty 2

## 2015-12-17 MED ORDER — VANCOMYCIN ORAL SOLUTION 50 MG/ML UNIT DOSE
125.0000 mg | Freq: Four times a day (QID) | ORAL | Status: DC
Start: 2015-12-17 — End: 2015-12-18
  Administered 2015-12-17 – 2015-12-18 (×5): 125 mg via ORAL
  Filled 2015-12-17 (×8): qty 2.5

## 2015-12-17 MED ORDER — CARVEDILOL 25 MG PO TABS
25.0000 mg | ORAL_TABLET | Freq: Two times a day (BID) | ORAL | Status: DC
Start: 2015-12-17 — End: 2015-12-17
  Filled 2015-12-17: qty 1

## 2015-12-17 MED ORDER — VANCOMYCIN HCL IN NACL 1.25-0.9 GM/250ML-% IV SOLN
1250.0000 mg | INTRAVENOUS | Status: DC
Start: 2015-12-18 — End: 2015-12-17

## 2015-12-17 MED ORDER — NITROGLYCERIN 2 % TD OINT
0.5000 [in_us] | TOPICAL_OINTMENT | TRANSDERMAL | Status: DC
Start: 2015-12-17 — End: 2015-12-18
  Administered 2015-12-17: 0.5 [in_us] via TOPICAL
  Filled 2015-12-17 (×2): qty 1

## 2015-12-17 MED ORDER — NITROGLYCERIN 0.4 MG SL SUBL
0.4000 mg | SUBLINGUAL_TABLET | SUBLINGUAL | Status: DC | PRN
Start: 2015-12-17 — End: 2015-12-18

## 2015-12-17 NOTE — Progress Notes (Signed)
Date Time: 12/17/2015 11:36 PM  Patient Name: [name], [name]  MRN: [id number]    Call by nurse to speak with pt. Because he is refusing everything.  Spoke with pt.  He states that he stopped the dialysis because he needed a breathing treatment.  He states that he is not in fluid overload because he would know.  He has 2 negative troponin and refused the third one right now because he said the chest pain is from his pneumonia but agrees to do lab work in the morning and to continue with his pain medications.  He also agrees to take his Hydralazine tonight.  He states that his blood pressure runs in the 170s and not worry about it. He states that he will agree for the dialysis tomorrow if he needs it.    Karlene Lineman, NP

## 2015-12-17 NOTE — Procedures (Addendum)
Treatment was terminated 1hr and 22 min early per patient request, educated on importance of completing treatment and risks associated with early treatment termination including death.;  Patient verbalized understanding and continued with early termination request.; All blood was returned, c/o SOB, respiratory doing treatment at bedside and has been requesting Dilaudid q 2hrs for c/o of chest pain especially when coughing.  Patient states "it is helping."  BP management offered and recommended by RNs and MD, patient refused plan and requested breathing treatment only. Tolerated treatment poorly and requested to terminate treatment early.  New TEGOs placed today using sterile technique with mask on RN and pt.  Dressing is intact/clean/dry, dressing change due 12/19/15 as current date on current dressing is 12/12/15.  Patient was educated on importance of Dialysis, compliance, risks, and how to preserve access and infection free, verbalized understanding.  MD Dirk Dress made aware of patient status and patient request for early termination.

## 2015-12-17 NOTE — Progress Notes (Signed)
[facility] [employer].  Progress Note    Assessment:  1.  ESRD  2.  Temperature elevation (home); r/o Permcath infection  3   Anemia; normocytic/hypochromic  4.  MMPs    Plan:  1.  Hemodialysis today - pt is refusing > 3 1/2 hours (suspect current under-dialysis); will target Uf 4L  2.  Aranesp/iron studies added  3.  qam labs to include Mg/PO4  4.  Continue current antibiotic regimen; qam random Vancomycin levels (would dose 1.25gm IVPB for level <15); blood cultures drawn this am  5.  Continue current medication regimen (tighter hold parameters for anti-hypertensives)    Further recommendations to follow    Thank you    Louellen Molder, MD  Office - [phone]  [employer] - [phone]  ++++++++++++++++++++++++++++++++++++++++++++++++++++++++++++++  Subjective:  No new complaints    Medications:  Scheduled Meds:  Current Facility-Administered Medications   Medication Dose Route Frequency   . albuterol-ipratropium  3 mL Nebulization Q6H SCH   . amLODIPine  10 mg Oral Daily   . aspirin EC  81 mg Oral Daily   . darbepoetin alfa  100 mcg Subcutaneous Weekly   . furosemide  80 mg Oral Daily   . heparin (porcine)  5,000 Units Subcutaneous Q8H SCH   . hydrALAZINE  100 mg Oral Q8H SCH   . metoprolol  100 mg Oral Q12H SCH   . nitroglycerin  0.5 inch Topical Q6H HOLD MN   . pantoprazole  40 mg Oral Daily   . piperacillin-tazobactam  2.25 g Intravenous Q8H SCH   . sevelamer  800 mg Oral TID MEALS   . vancomycin  125 mg Oral 4 times per day     Continuous Infusions:   PRN Meds:acetaminophen, HYDROmorphone, naloxone, nitroglycerin, nitroglycerin, ondansetron **OR** ondansetron    Objective:  Vital signs in last 24 hours:  Temp:  [98.4 F (36.9 C)-99.4 F (37.4 C)] 98.4 F (36.9 C)  Heart Rate:  [101-118] 101  Resp Rate:  [18-30] 24  BP: (159-198)/(85-127) 183/101 mmHg  Intake/Output last 24 hours:  No intake or output data in the 24 hours ending 12/17/15 1155  Intake/Output this shift:       Physical  Exam:   Gen: Alert/pleasant; mildly uncomfortable with non-specific Sxs   CV: S1 S2 N RRR   Chest: No rales/no rhonchi   Ab: ND NT soft no HSM +BS   Ext: No edema    Labs:    Recent Labs  Lab 12/17/15  0329   GLUCOSE 83   BUN 31.0*   CREATININE 8.3*   CALCIUM 6.8*   SODIUM 137   POTASSIUM 4.3   CHLORIDE 100   CO2 24   ALBUMIN 2.6*   MAGNESIUM 1.7       Recent Labs  Lab 12/17/15  0528 12/17/15  0329   WBC  --  9.04   HGB 8.1* 4.7*   HEMATOCRIT 26.3* 15.1*   MCV  --  93.8   MCH  --  29.2   MCHC  --  31.1*   RDW  --  16*   MPV  --  9.7   PLATELETS  --  178

## 2015-12-17 NOTE — Plan of Care (Signed)
Care Plan Notes          [facility] Nursing Progress Note    [name] is a 45 y.o. male  Admitted 12/17/2015  3:09 AM [facility] day 0)    Indication for continued [facility] status:  Chest pain, SOB, Admitted from ED    Major Shift Events:  Chest pain increasing no relief however patient unwilling to utilize antihypertensives and/or nitro. Refusing almost all meds. Undergoing dialysis c/o worsening in SOB and chest pain. Educated and recommended to take nitro or antihypertensives. Adamant that he will not because he tried it in the ED and did not work. Regardless of cardiology recs and MD recs. Will try nebulizer and dialysis stopped. BP began to increase during dialysis as well to SBP 180 -190. Dilaudid given ATC q 2, patient states it relieves a little bit. Hospitalist recommended patient take antihypertensive meds, will come to bedside to round, and hold narcotics. Patient angry regarding held narcotics.     Cardiologist on call notified and called back. Recommended metoprolol, Nitro, and repeat EKG tonight.     Patient finally willing to take antihypertensives Metoprolol and Hydralazine and Nitro paste @ 1800. Will continue to monitor.     Review of Systems  Neuro:  Intact. Alert and oriented. Refused CHG bath and SCDs. Refused Heparin. Follows commands. Ambulates to restroom.  Refuses bed alarm. Educated on every safety precaution and intervention.     Cardiac:  Sinus Tach. Echo not completed due to dialysis. Hypertensive refusing meds. Trops taken this AM then refused second set until dialysis began     Respiratory:  NC increased to 6L when increased in SOB during dialysis. Throughout the day on NC 2L. States he feels "elephant on chest". EKG WNL    GI/GU:  Eating minimally. Anuric.     BM this shift? YES, cdiff precautions started. Stool cultures pending.     Skin Assessment  Dry feet. Skin intact.       LDAs  Patient Lines/Drains/Airways Status    Active Lines, Drains and Airways     Name:   Placement date:    Placement time:   Site:   Days:    Permacath/Temporary Catheter Permacath Left               Peripheral IV 12/17/15 Right Upper Arm  12/17/15   0335   Upper Arm   less than 1    Graft/Fistula Right              Graft/Fistula Left                              Indication for Central Access and estimated target removal date?  Permacath to L chest for dialysis       Indication for Foley and estimated target removal date?  na      Psycho/Social:       Reviewed by Octavio Manns RN.

## 2015-12-17 NOTE — Plan of Care (Signed)
Problem: Safety  Goal: Patient will be free from injury during hospitalization  Outcome: Progressing

## 2015-12-17 NOTE — ED Notes (Signed)
Pt reported only produces very little amount of urine. MD made aware

## 2015-12-17 NOTE — ED Provider Notes (Signed)
None        History     Chief Complaint   Patient presents with   . Chest Pain   . Cough   . Shortness of Breath     HPI Comments: Recent Dialysis done on Monday.       Patient is a 45 y.o. male presenting with chest pain, cough, and shortness of breath. The history is provided by the patient.   Chest Pain  Pain location:  Substernal area  Pain quality: aching, dull, pressure and radiating    Pain quality comment:  Symptoms similar to previous MI  Pain radiates to:  L arm, upper back and L jaw  Pain radiates to the back: yes    Pain severity:  Severe  Onset quality:  Gradual  Timing:  Constant  Progression:  Unchanged  Relieved by:  None tried  Associated symptoms: cough, fever, lower extremity edema, nausea, palpitations and shortness of breath    Associated symptoms: not vomiting    Fever:     Duration:  12 hours    Fever timing: one time.    Max temp PTA (F):  101oF with measurement at home    Progression:  Waxing and waning  Cough  Associated symptoms: chest pain, fever and shortness of breath    Shortness of Breath  Associated symptoms: chest pain, cough and fever    Associated symptoms: no vomiting             Past Medical History   Diagnosis Date   . Hypertension    . Kidney failure    . Asthma without status asthmaticus    . Clostridium difficile diarrhea        Past Surgical History   Procedure Laterality Date   . Av fistula placement     . Peritoneal catheter insertion         Family History   Problem Relation Age of Onset   . Family history unknown: Yes       Social  Social History   Substance Use Topics   . Smoking status: Former Smoker -- 0.50 packs/day     Types: Cigarettes     Quit date: 09/18/2015   . Smokeless tobacco: Former Neurosurgeon   . Alcohol Use: No       .     Allergies   Allergen Reactions   . Lisinopril Anaphylaxis   . Morphine Anaphylaxis   . Toradol [Ketorolac Tromethamine]        Home Medications             albuterol (PROVENTIL) (2.5 MG/3ML) 0.083% nebulizer solution     Take 2.5 mg by  nebulization.     albuterol-ipratropium (COMBIVENT RESPIMAT) 20-100 MCG/ACT Aero Soln     Inhale 2 puffs into the lungs.     amLODIPine (NORVASC) 10 MG tablet     Take 10 mg by mouth daily.         carvedilol (COREG) 25 MG tablet     Take 25 mg by mouth 2 (two) times daily.     furosemide (LASIX) 80 MG tablet     Take 80 mg by mouth daily.     hydrALAZINE (APRESOLINE) 100 MG tablet     Take 100 mg by mouth 3 (three) times daily.     pantoprazole (PROTONIX) 40 MG tablet     Take 40 mg by mouth daily.     sevelamer (RENVELA) 800 MG tablet  Take 800 mg by mouth 3 (three) times daily with meals.           Review of Systems   Constitutional: Positive for fever.   HENT: Negative.    Respiratory: Positive for cough and shortness of breath.    Cardiovascular: Positive for chest pain and palpitations.   Gastrointestinal: Positive for nausea. Negative for vomiting and diarrhea.       Physical Exam    BP: (!) 192/107 mmHg, Heart Rate: (!) 118, Temp: 99.4 F (37.4 C), Resp Rate: (!) 24, SpO2: 96 %, Weight: 106.595 kg    Physical Exam   Constitutional: He is oriented to person, place, and time. He appears well-developed.   HENT:   Head: Normocephalic and atraumatic.   Cardiovascular: S1 normal and S2 normal.  Tachycardia present.  Exam reveals no gallop, no distant heart sounds and no friction rub.    No murmur heard.  Peripheral edema present   Pulmonary/Chest: No accessory muscle usage. Tachypnea noted. No apnea. No respiratory distress. He has no decreased breath sounds. He has wheezes. He has no rhonchi. He has no rales.   Neurological: He is alert and oriented to person, place, and time.   Skin: Skin is warm, dry and intact.         MDM and ED Course     ED Medication Orders     Start Ordered     Status Ordering Provider    12/17/15 [phone] 12/17/15 0335  HYDROmorphone (DILAUDID) injection 0.4 mg   Once     Route: Intravenous  Ordered Dose: 0.4 mg     Nancie Neas, Isac Lincks G    12/17/15 0322 12/17/15 0321  nitroglycerin  (NITRO-BID) 2 % ointment 1 inch   Once     Route: Topical  Ordered Dose: 1 inch     Last MAR action:  Given KOPACK, GREGORY E    12/17/15 0321 12/17/15 0321  nitroglycerin (NITROSTAT) SL tablet 0.4 mg   Every 5 min PRN     Route: Sublingual  Ordered Dose: 0.4 mg     Last MAR action:  Given KOPACK, GREGORY E             MDM  Number of Diagnoses or Management Options            Procedures    Clinical Impression & Disposition     Clinical Impression  Final diagnoses:   None        ED Disposition     None           New Prescriptions    No medications on file        Treatment Team: Scribe: Maurine Simmering, MD  Resident  12/17/15 0340    Maylon Peppers, MD  Resident  12/17/15 0400

## 2015-12-17 NOTE — Consults (Signed)
ID CONSULTATION      Date Time: 12/17/2015 2:05 PM  Patient Name: [name], [name]  Requesting Physician: Lamount Cohen, MD     Reason for Consultation:   Pneumonia, recurrent C.diff    Problem List:   Acute Problem List:   Presents with L sided chest pain  CT chest with RUL nodules, possible PNA.     --New respiratory symptoms  Multiple episodes of C.diff.  Last confirmed positive result in 09/2015   --Recurrence of diarrhea  Hospital visits to multiple hospital systems in the region, usually end with pt leaving AMA due to insufficient pain medications.    ESRD on HD.  Currently via L chest permacath as LUE fistula is non-functional  Has been on PD in the past  ID History:     Chronic Conditions:  HTN, Asthma, CAD, PE.  Clotted R subclavian stent with collaterals on CT scan  All: Lisnopril, Morphine, Toradol  Assessment:   45 yo M with Hx of ESRD on HD and opiate dependence/seeking presents with L sided chest pain.  RUL nodules discovered on CT chest.  Per read these may represent PNA, particularly in light of new respiratory Sx.  Should not cause L sided chest pain, however.  Blood cx are pending.  In light of HD and Permacath, septic emboli are possible.  He is also experiencing a recurrence of his GI symptoms suggestive of C.diff colitis.  Has had numerous episodes.  Positive results documented at an outside hospital by PCR.  Multiple other tests across other centers are negative.  Frequency of admissions and various hospital systems involved suggest secondary gain.  Systemic Abx should be limited as far as possible in light of his C.diff Hx.  Reasonable to consult GI to discuss a stool transplant as this has been suggested to the pt in the past    Antibiotics:   #2 Vancomycin IV by random levels  #2 Zosyn 2.25 gm IV q 8 (d/c)  #1 Doxycycline 100mg  PO BID  #1 Vancomycin 125 mg PO q 6    Recommendations:   Continue PO Vancomycin for suspected C.diff  Stool testing is pending  If positive, consider GI eval for  FMT  Monitor systemic Vancomycin levels.  Will decide on redosing based on blood Cx  Change Zosyn to Doxy.  PNA is very mild on CT and this drug would have less of an impact on the bowel flora  Blood Cx are pending  Consider [place] of abdomen and aspiration of ascites fluid if present  Discussed with pt in detail.    Thank you for this consult, will follow with you closely.      ________________________________________________________________________    Thank you for allowing me to participate in the care of this very interesting and pleasant patient    History:   [name] is a 45 y.o. male who presents to the hospital on 12/17/2015 with 1 day of L sided chest pain and arm discomfort.  Reports a new cough productive of yellow sputum.  Possible fever.  No chills.  No pain or swelling at L sided Permacath or LUE fistula.  Pt has a Hx of ESRD.  In the past has been on HD.  Poorly compliant with HD.  Multiple admissions at various hospitals which frequently end with the pt leaving AMA.  Hx of recurrent C.diff colitis.  Reports onset of liquid foul smelling diarrhea 2 days ago.  Recently treated with PO Vancomycin.  CT chest did not  show clots, but did show new nodules in the RUL, possibly indicative of PNA.  Pt falls asleep in between questions    Past Medical History:     Past Medical History   Diagnosis Date   . Hypertension    . Kidney failure    . Asthma without status asthmaticus    . Clostridium difficile diarrhea        Past Surgical History:     Past Surgical History   Procedure Laterality Date   . Av fistula placement     . Peritoneal catheter insertion         Family History:     Family History   Problem Relation Age of Onset   . Hypertension Mother        Social History:     Social History     Social History   . Marital Status: Single                     Social History Main Topics   . Smoking status: Former Smoker -- 0.50 packs/day     Types: Cigarettes     Quit date: 09/18/2015   . Smokeless tobacco: Former  Neurosurgeon   . Alcohol Use: No   . Drug Use: No     Allergies:     Allergies   Allergen Reactions   . Lisinopril Anaphylaxis   . Morphine Anaphylaxis   . Toradol [Ketorolac Tromethamine]        Lines:   Permacath/Temporary Catheter Permacath Left    *I have performed a risk-benefit analysis and the patient needs a central line for access and IV medications    Medications:     Current Facility-Administered Medications   Medication Dose Route Frequency   . albuterol-ipratropium  3 mL Nebulization Q6H SCH   . amLODIPine  10 mg Oral Daily   . aspirin EC  81 mg Oral Daily   . darbepoetin alfa  100 mcg Subcutaneous Weekly   . furosemide  80 mg Oral Daily   . heparin (porcine)  5,000 Units Subcutaneous Q8H SCH   . hydrALAZINE  100 mg Oral Q8H SCH   . metoprolol  100 mg Oral Q12H SCH   . nitroglycerin  0.5 inch Topical Q6H HOLD MN   . pantoprazole  40 mg Oral Daily   . piperacillin-tazobactam  2.25 g Intravenous Q8H SCH   . sevelamer  800 mg Oral TID MEALS   . vancomycin  125 mg Oral 4 times per day       Review of Systems:   General ROS: negative for - chills, fevers, night sweats, weight loss   HEENT: negative for - blurry vision, sore throat, thrush   Respiratory ROS: + for cough, SOB  Cardiovascular ROS: negative for -  Palpitations, + chest pain,  Gastrointestinal ROS: + for - abdominal pain, nausea, vomiting, diarrhea  Genito-Urinary ROS: negative for - dysuria, urinary frequency/urgency   Musculoskeletal ROS: negative for - joint pain, joint stiffness or muscle pain   Dermatological ROS: negative for - rash and skin lesion changes   Neurological ROS: negative for - confusion, headache, dizziness  Hematological ROS: negative for - bruising, bleeding   Psychological ROS: negative for - changes in mood      Physical Exam:     Filed Vitals:    12/17/15 1200   BP: 183/101   Pulse: 112   Temp: 98.4 F (36.9 C)   Resp: 25   SpO2:  100%       General Appearance: alert and appropriate, non-toxic  Neuro: alert, oriented, normal  speech, normal attention and cognition   HEENT: no scleral icterus, pupils round and reactive, OP clear, NCAT, EOMI  Neck: supple, no significant adenopathy   Lungs: clear to auscultation, no wheezes, rales or rhonchi, symmetric air entry   Cardiac: normal rate, regular rhythm, normal S1, S2, No m/r/g  Abdomen: soft, diffuse moderate ttp, non-distended, increased bowel sounds  Extremities: no pedal edema, no c/c  Skin: no rash    Labs:     Lab Results   Component Value Date    WBC 9.04 12/17/2015    HGB 8.1* 12/17/2015    HCT 26.3* 12/17/2015    MCV 93.8 12/17/2015    PLT 178 12/17/2015     Lab Results   Component Value Date    CREAT 8.3* 12/17/2015     Lab Results   Component Value Date    ALT 7 12/17/2015    AST 13 12/17/2015    ALKPHOS 287* 12/17/2015    BILITOTAL 0.5 12/17/2015     Lab Results   Component Value Date    LACTATE 0.6 11/09/2015       Microbiology:     Microbiology Results     Procedure Component Value Units Date/Time    Blood Culture Aerobic/Anaerobic #1 [[id number]] Collected:  12/17/15 0328    Specimen Information:  Arm from Blood Updated:  12/17/15 0522    Narrative:      1 BLUE+1 PURPLE    Blood Culture Aerobic/Anaerobic #2 [[id number]] Collected:  12/17/15 0328    Specimen Information:  Arm from Blood Updated:  12/17/15 0522    Narrative:      1 BLUE+1 PURPLE    CULTURE + Dierdre Forth [[id number]] Collected:  12/17/15 1226    Specimen Information:  Sputum from Sputum, Expectorated Updated:  12/17/15 1226    Clostridium difficile toxin B PCR [[id number]] Collected:  12/17/15 1226    Specimen Information:  Stool from Stool Updated:  12/17/15 1226    Narrative:      Patient's current admission began:->less than or equal to 3  days ago  Cancel C-Diff order if no diarrhea in 12 hours?->Yes  DELAY AFFECTS RESULT    MRSA culture [[id number]] Collected:  12/17/15 0916    Specimen Information:  Body Fluid from Nares and Throat Updated:  12/17/15 1400    Stool for Salm/Shig/Campy/Shiga PCR  [[id number]] Collected:  12/17/15 1226    Specimen Information:  Stool Updated:  12/17/15 1226          Rads:   Ct Angio Chest (pe Or Trauma Protocol)    12/17/2015  No main pulmonary emboli. Evaluation of the lobar, segmental and subsegmental branches is markedly suboptimal due to motion artifact. Multiple subcentimeter nodular opacities are seen in the right upper lung, possibly pneumonia, new. Trace right and small left pleural effusions, new. Atelectasis at the lung bases. Mediastinal and mild right axillary lymphadenopathy. Correlate clinically for etiology. 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 Johnsie Kindred, MD 12/17/2015 5:38 AM     Xr Chest  Ap Portable    12/17/2015  1. Mild basilar atelectasis with probable small bilateral pleural effusions. Olen Pel, MD 12/17/2015 3:43 AM       Signed by: Micheline Rough, MD

## 2015-12-17 NOTE — H&P (Signed)
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     Pt HDS with continued chest pain, EKG stable, CE neg x1, CTA early PNA, neg for PE, Bld Cx and Sputum cx pending.   Inital H/H 4.7, repeat 8.1 - pt was NOT transfused.     Plan:   #Chest pain, hx of CAD s/p PCI 2010  Cards consult (IMG)  Tele  Trend CE  CTA neg for PE  EKG stable, no ST changes  ASA  Nitro SL prn  Dilaudid prn (morphine allergy)  Cont Coreg  Echocardiogram 10/2015: mild to moderate pulmonary HTN, EF of 55%, RV function upper limits of normal, grade II diastolic dysfunction    #ESRD on HD MWF at [facility] in [name]   Renal consult (Dr Tereso Newcomer)  HD per renal  Cont Renvela     #HCAP, early PNA on CTA with productive sputum, afebrile without leukocytosis, hx of Asthma   Cont Vanc/Zosyn pending ID recs  Duonebs q6h  Wean O2 as tolerated  Bld Cx pending  Sputum Cx    #Diarrhea with hx of C.diff, recently on PO Vanc, per pt recommended for fecal transplant   ID consult  C.diff pending (most recent C.diff 7/24 was negative)  Stool studies pending  Resume PO Vanc    #HTN  Cont Coreg 25mg  bid  Cont Norvasc 10mg  bid  Cont hydralazine 100mg  tid    #Hx of remote PE x1, not currently on [facility]  CTA chest neg for PE    Full code  DVT ppx heparin    Disposition:     Today's date: 12/17/2015  Admit Date: 12/17/2015  3:09 AM  Anticipated medical stability for discharge:Red - not tomorrow - estimated month/date:  tbd  Service status: Inpatient: risk of morbidity and mortality and risk of progressive disease  Reason for ongoing hospitalization: HCAP, IV abx, HD, Chest pain, C.diff  Anticipated discharge needs: tbd    History of Presenting Illness:   [name] is a 45 y.o. male with PMH HTN, CAD s/p PCI, remote hx of PE (not on chronic AC), Asthma, ESRD on HD MWF at [facility] in [place] and history of a c diff colitis presenting with cough, chest pain, N/V and diarrhea. Pt states his symptoms began yesterday with indigestion, N/V and then diarrhea with chest pain. He describes his chest pain as left sided heavy pressure with associated shortness of breath and diaphoresis. His symptoms where not improved with Nitro in the ED but did resolved somewhat with IV dilaudid. He reports cough starting yesterday with yellow productive sputum and subjective fever/chills.  He admits to hx of C.diff, recently off PO Vanc last month now with recurrence of watery foul smelling diarrhea, similar to what he has experienced in the past with C.diff.  Pt reports he was told he would need a fecal transplant, but has not followed up and states he does not have a regular ID or GI doctor. Pt reports receiving HD on Monday as scheduled. Pt denies abdominal pain, melena, hematochezia, dysuria, hematuria. Pt states he is on Home O2 only during HD.     Past Medical History:     Past Medical History   Diagnosis Date   . Hypertension    . Kidney failure    . Asthma without status asthmaticus    . Clostridium difficile diarrhea        Available old records reviewed, including:  epic    Past Surgical History:     Past Surgical History   Procedure Laterality Date   . Av fistula placement     . Peritoneal catheter insertion         Family History:     Family History   Problem Relation Age of Onset   . Family history unknown: Yes       Social History:     History   Smoking status   . Former Smoker -- 0.50 packs/day   . Types: Cigarettes   . Quit date:  09/18/2015   Smokeless tobacco   . Former User     History   Alcohol Use No     History   Drug Use No       Allergies:     Allergies   Allergen Reactions   . Lisinopril Anaphylaxis   . Morphine Anaphylaxis   . Toradol [Ketorolac Tromethamine]        Medications:     Current Discharge Medication List      CONTINUE these medications which have NOT CHANGED    Details   albuterol (PROVENTIL) (2.5 MG/3ML) 0.083% nebulizer solution Take 2.5 mg by nebulization.      albuterol-ipratropium (COMBIVENT RESPIMAT) 20-100 MCG/ACT Aero Soln Inhale 2 puffs into the lungs.      amLODIPine (NORVASC) 10 MG tablet Take 10 mg by mouth daily.          carvedilol (COREG) 25 MG tablet Take 25 mg by mouth 2 (two) times daily.      furosemide (LASIX) 80 MG tablet Take 80 mg by mouth daily.      hydrALAZINE (APRESOLINE) 100 MG tablet Take 100 mg by mouth 3 (three) times daily.      pantoprazole (PROTONIX) 40 MG tablet Take 40 mg by mouth daily.      sevelamer (RENVELA) 800 MG tablet Take 800 mg by mouth 3 (three) times daily with meals.               Method by which medications were confirmed on admission: pt at bedside    Review of Systems:   All other systems were reviewed and are negative per HPI    Physical Exam:   Patient Vitals for the past 24 hrs:   BP Temp Temp src Pulse Resp SpO2 Height Weight   12/17/15 0630 (!) 159/94 mmHg - - (!) 103 - 97 % - -   12/17/15 0530 (!) 165/96 mmHg - - (!) 108 22 100 % - -   12/17/15 0500 185/85 mmHg - - (!) 112 21 96 % - -   12/17/15 0400 (!) 198/94 mmHg - - (!) 110 18 98 % - -   12/17/15 0351 167/88 mmHg - - (!) 111 (!) 30 99 % - -   12/17/15 0305 (!) 192/107 mmHg  99.4 F (37.4 C) Temporal Art (!) 118 (!) 24 96 % 1.854 m (6\' 1" ) 106.595 kg (235 lb)     Body mass index is 31.01 kg/(m^2).  No intake or output data in the 24 hours ending 12/17/15 0846    General: awake, alert, oriented x 3; no acute distress.  HEENT: perrla, eomi, sclera anicteric  oropharynx clear without lesions, mucous membranes  moist  Neck: supple, no lymphadenopathy, no thyromegaly, no JVD, no carotid bruits  Cardiovascular: regular rate and rhythm, no murmurs, rubs or gallops  Lungs: clear to auscultation bilaterally, without wheezing, rhonchi, or rales  Abdomen: soft, non-tender, non-distended; no palpable masses, no hepatosplenomegaly, normoactive bowel sounds, no rebound or guarding  Extremities: no clubbing, cyanosis, or edema  Neuro: cranial nerves grossly intact, strength 5/5 in upper and lower extremities, sensation intact,   Skin: no rashes or lesions noted        Labs:     Results     Procedure Component Value Units Date/Time    Hemoglobin and hematocrit, blood [[id number]]  (Abnormal) Collected:  12/17/15 0528    Specimen Information:  Blood Updated:  12/17/15 0753     Hgb 8.1 (L) g/dL      Hematocrit 19.1 (L) %     Prepare RBC: one unit [[id number]] Collected:  12/17/15 0528     RBC Leukoreduced RBC Leukoreduced Updated:  12/17/15 0738     BLUNIT [id number]      Status selected      PRODUCT CODE (NON READABLE) [id number]      Expiration Date [id number]      UTYPE O POS     Type and Screen [[id number]] Collected:  12/17/15 0528    Specimen Information:  Blood Updated:  12/17/15 0737     ABO Rh O POS      AB Screen Gel NEG     Narrative:      ?One unit->1  ?Special Requirements->No special requirements  ?Transfusion Criteria->Hgb <7g/dL or Hct <03% in symptomatic,  ?hemodynamically stable patient  ?Has the patient been transfused w/i the last 3 months?->No    Blood Culture Aerobic/Anaerobic #1 [[id number]] Collected:  12/17/15 0328    Specimen Information:  Arm from Blood Updated:  12/17/15 0522    Narrative:      1 BLUE+1 PURPLE    Blood Culture Aerobic/Anaerobic #2 [[id number]] Collected:  12/17/15 0328    Specimen Information:  Arm from Blood Updated:  12/17/15 0522    Narrative:      1 BLUE+1 PURPLE    CBC with differential [[id number]]  (Abnormal) Collected:  12/17/15 0329    Specimen Information:  Blood from Blood Updated:   12/17/15 0448     WBC 9.04 x10 3/uL      Hgb 4.7 (LL) g/dL      Hematocrit 63.0 (L) %      Platelets 178 x10 3/uL      RBC 1.61 (L) x10 6/uL      MCV 93.8 fL      MCH 29.2 pg      MCHC 31.1 (L) g/dL      RDW 16 (H) %      MPV 9.7 fL      Neutrophils 76.9 %      Lymphocytes Automated 6.1 %      Monocytes 9.0 %      Eosinophils Automated 7.5 %      Basophils Automated 0.3 %      Immature Granulocyte 0.2 %  Nucleated RBC 0.0 /100 WBC      Neutrophils Absolute 6.95 x10 3/uL      Abs Lymph Automated 0.55 x10 3/uL      Abs Mono Automated 0.81 x10 3/uL      Abs Eos Automated 0.68 x10 3/uL      Absolute Baso Automated 0.03 x10 3/uL      Absolute Immature Granulocyte 0.02 x10 3/uL      Absolute NRBC 0.00 x10 3/uL     Comprehensive metabolic panel [[id number]]  (Abnormal) Collected:  12/17/15 0329    Specimen Information:  Blood Updated:  12/17/15 0403     Glucose 83 mg/dL      BUN 40.9 (H) mg/dL      Creatinine 8.3 (H) mg/dL      Sodium 811 mEq/L      Potassium 4.3 mEq/L      Chloride 100 mEq/L      CO2 24 mEq/L      Calcium 6.8 (L) mg/dL      Protein, Total 5.9 (L) g/dL      Albumin 2.6 (L) g/dL      AST (SGOT) 13 U/L      ALT 7 U/L      Alkaline Phosphatase 287 (H) U/L      Bilirubin, Total 0.5 mg/dL      Globulin 3.3 g/dL      Albumin/Globulin Ratio 0.8 (L)     Magnesium [[id number]] Collected:  12/17/15 0329    Specimen Information:  Blood Updated:  12/17/15 0403     Magnesium 1.7 mg/dL     GFR [[id number]] Collected:  12/17/15 0329     EGFR 8.5 Updated:  12/17/15 0403    i-Stat CG4 Venous CartrIDge [[id number]] Collected:  12/17/15 0337     i-STAT pH Venous 7.479 Updated:  12/17/15 0341     i-STAT pCO2 Venous 36.4      i-STAT pO2 Venous 49.0      i-STAT HCO3 Bicarbonate Venous 26.9 mEq/L      i-STAT Total CO2 Venous 28.0 mEq/L      i-STAT Base Excess Venous 3.0 mEq/L      i-STAT O2 Saturation Venous 86.0 %      i-STAT Lactic acid 1.0 mmol/L      i-STAT Patient Temperature 99.4      i-STAT FIO2 21      i-STAT O2 Delivery  Nasal Can      i-STAT Allen's Test NA      i-STAT Draw Site Venous      i-STAT Liters Per Minute 2.0     i-Stat Troponin [[id number]] Collected:  12/17/15 0326     i-STAT Troponin 0.02 ng/mL Updated:  12/17/15 0341          Imaging personally reviewed, including:   Ct Angio Chest (pe Or Trauma Protocol)    12/17/2015  No main pulmonary emboli. Evaluation of the lobar, segmental and subsegmental branches is markedly suboptimal due to motion artifact. Multiple subcentimeter nodular opacities are seen in the right upper lung, possibly pneumonia, new. Trace right and small left pleural effusions, new. Atelectasis at the lung bases. Mediastinal and mild right axillary lymphadenopathy. Correlate clinically for etiology. 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 Johnsie Kindred, MD 12/17/2015 5:38 AM     Xr Chest  Ap Portable    12/17/2015  1. Mild basilar atelectasis with probable small bilateral pleural effusions. Olen Pel, MD 12/17/2015 3:43 AM  Safety Checklist  DVT prophylaxis:  CHEST guideline (See page [id number]) Chemical and Mechanical   Foley:  [place] Rn Foley protocol Not present   IVs:  Peripheral IV   PT/OT: Not needed   Daily CBC & or Chem ordered:  SHM/ABIM guidelines (see #5) Yes, due to clinical and lab instability   Reference for approximate charges of common labs: CBC auto diff - $76  BMP - $99  Mg - $79    Signed by: Lannette Donath, PA-C  cc:Pcp, Noneorunknown, MD

## 2015-12-17 NOTE — ED Provider Notes (Signed)
Date Time: 12/17/2015 3:59 AM  Patient Name: [name]  Attending Physician: Gaynelle Cage    Attending Note:   I have reviewed and agree with the history. The pertinent physical exam has been documented. The prelim plan of care has been discussed. I have seen and examined the pt personally  Selected historical findings: 45 yo/M presents with gradually worsening, dull LT sided CP a/w fever (Tmax 101) and SOB on setting around 2 pm today. Pt endorses feeling palpitations.    Exam: Awake/alert; chest clear; heart fast reg S1S2+; abd soft/NT; ext no c/c/e.  EKG: Sinus Tach 120, Normal Axis, No acute Ischemic Changes.  Selected additional physical examination findings: cath left chest  Assessment: cp  Plan: labs imaging prbable admit    Initial low h/h. Pt gets regualr dialysis(presumable with periodic labs) no bleeding, not hypotensive but tachy, will repeat h/h but plan on transfusion for now.    Repeat h/h baseline, held prbcs    Possible pna on ct treat for pna.    I was acting as a Neurosurgeon for Carmon Sails, MD on [name]  I am the first provider for this patient and I personally performed the services documented. Effie Shy is scribing for me on [name],Jorge D. This note accurately reflects work and decisions made by me.   Carmon Sails, MD    Carmon Sails, MD  12/19/15 [phone]

## 2015-12-17 NOTE — Consults (Signed)
[facility] CONSULTATION    Date Time: 12/17/2015 10:55 AM  Patient Name: [name]  Requesting Physician: Lamount Cohen, MD  Admission Date: 12/17/2015   Primary Cardiologist: Sigmund Hazel, MD    Reason for Consultation:    The patient is a 45 year old male with a history of malignant hypertension and resultant end-stage renal disease.    The patient was recently discharged from this hospital with C. difficile diarrhea and problems with peritoneal dialysis.  A vas cath was placed and the patient is receiving hemodialysis.    The patient states that he has had stents placed in the coronary arteries.  He does not know where the stents were placed.  He states he did not have a myocardial infarction.  We do not have records on this.  This was performed in 2010.  She complains of a cough.  He also describes a pressure in the chest.  This is been persistent for at least 24 hours.  This does increase with deep inspiration.    The patient underwent CT angiography of the chest.  There is no evidence of aortic dissection or pulmonary embolus.  There is hilar adenopathy.    In regards to hypertension.  The patient's not had a CVA.  He has by history had coronary artery disease.  Last ejection fraction was 60 percent without valvular abnormalities.  She has had no history of CVA.  The patient is on hemodialysis or dialysis for 7 years.    The patient states he had a temperature of 101 while at home.  The patient has been started on Zosyn along with vancomycin.    Review of the labs shows the troponins are normal.  The electrocardiogram shows nonspecific changes and is unchanged from the electrocardiogram of 3 months ago.  History:    See above    Past Medical History:     Past Medical History   Diagnosis Date   . Hypertension    . Kidney failure    . Asthma without status asthmaticus    . Clostridium difficile diarrhea       Past Surgical History   Procedure Laterality Date   . Av fistula placement     .  Peritoneal catheter insertion        Family History:     Family History   Problem Relation Age of Onset   . Family history unknown: Yes     Social History:     Social History     Social History   . Marital Status: Single     Spouse Name: N/A   . Number of Children: N/A   . Years of Education: N/A     Social History Main Topics   . Smoking status: Former Smoker -- 0.50 packs/day     Types: Cigarettes     Quit date: 09/18/2015   . Smokeless tobacco: Former Neurosurgeon   . Alcohol Use: No   . Drug Use: No   . Sexual Activity: Not on file     Other Topics Concern   . Not on file     Social History Narrative      Allergies:   He is allergic to lisinopril; morphine; and toradol.  Medications:   Home Meds:   Prescriptions prior to admission   Medication Sig   . albuterol (PROVENTIL) (2.5 MG/3ML) 0.083% nebulizer solution Take 2.5 mg by nebulization.   Marland Kitchen albuterol-ipratropium (COMBIVENT RESPIMAT) 20-100 MCG/ACT Aero Soln Inhale 2 puffs into the  lungs.   . amLODIPine (NORVASC) 10 MG tablet Take 10 mg by mouth daily.       . carvedilol (COREG) 25 MG tablet Take 25 mg by mouth 2 (two) times daily.   . furosemide (LASIX) 80 MG tablet Take 80 mg by mouth daily.   . hydrALAZINE (APRESOLINE) 100 MG tablet Take 100 mg by mouth 3 (three) times daily.   . pantoprazole (PROTONIX) 40 MG tablet Take 40 mg by mouth daily.   . sevelamer (RENVELA) 800 MG tablet Take 800 mg by mouth 3 (three) times daily with meals.      Scheduled Meds:     albuterol-ipratropium 3 mL Nebulization Q6H SCH   amLODIPine 10 mg Oral Daily   aspirin EC 81 mg Oral Daily   furosemide 80 mg Oral Daily   heparin (porcine) 5,000 Units Subcutaneous Q8H SCH   hydrALAZINE 100 mg Oral Q8H SCH   metoprolol 100 mg Oral Q12H SCH   nitroglycerin 0.5 inch Topical Q6H HOLD MN   pantoprazole 40 mg Oral Daily   piperacillin-tazobactam 2.25 g Intravenous Q8H SCH   sevelamer 800 mg Oral TID MEALS   [START ON 12/18/2015] vancomycin 1,250 mg Intravenous Q24H   vancomycin 125 mg Oral 4 times  per day     Continuous Infusions:       PRN Meds:  acetaminophen, HYDROmorphone, naloxone, nitroglycerin, nitroglycerin, ondansetron **OR** ondansetron    Review of Systems:   Weight stable. No fatigue. No asthma, wheezing, or respiratory problem. No ulcer, gall bladder or reflux. No kidney problems. No thyroid history. Neurologic negative. No anemia or bleeding. No claudication. All other systems were reviewed and negative except as mentioned above.    Physical Exam:   Vital Signs: BP 170/94 mmHg  Pulse 105  Temp(Src) 98.4 F (36.9 C) (Oral)  Resp 21  Ht 1.854 m (6\' 1" )  Wt 106.595 kg (235 lb)  BMI 31.01 kg/m2  SpO2 99%   No intake or output data in the 24 hours ending 12/17/15 1055  General AppearanceThe patient complains of atypical chest discomfort.  He is on O2 via Ventimask  HEENT: Sclera anicteric, conjunctiva without pallor, moist mucous membranes, normal dentition. No arcus.   Neck: Supple without jugular venous distention.  Catheter in the left subclavian  Chest: Clear to auscultation bilaterally with good air movement and respiratory effort and no wheezes, rales, or rhonchi   Cardiovascular: Normal S1 and physiologically split S2 without murmurs, gallops or rub. PMI of normal size and nondisplaced.     Abdomen:  No pulsatile masses, or bruits.   Extremities: Warm without edema, clubbing, or cyanosis. All peripheral pulses are full and equal.  There is a fistula nonfunctioning in the left upper arm  Skin: Normal coloration and turgor, no rash, xanthoma or xanthelasma.   Musculoskeletal: no joint tenderness, deformity or swelling  Neuro: Alert and oriented x3. Grossly intact. Strength is symmetrical. Normal mood and affect.   Cardiographics:   ECGNormal sinus rhythm, left axis deviation, nonspecific ST-T wave changes  Telemetry:   Echocardiogram:   Nuclear stress test:   Catheterization:   Labs Reviewed:     Recent Labs      12/17/15   0528  12/17/15   0329   WBC   --   9.04   HGB  8.1*  4.7*      HEMATOCRIT  26.3*  15.1*   PLATELETS   --   178     Recent Labs  12/17/15   0924  12/17/15   0326   TROPONIN I  0.07   --    I-STAT TROPONIN   --   0.02    Recent Labs      12/17/15   0329   SODIUM  137   POTASSIUM  4.3   CO2  24   BUN  31.0*   CREATININE  8.3*   GLUCOSE  83   MAGNESIUM  1.7    Estimated Creatinine Clearance: 14.4 mL/min (based on Cr of 8.3).   No results for input(s): INR, TSH, CHOL, TRIG, HDL, LDL in the last 72 hours.     Rads:     Radiology Results (24 Hour)     Procedure Component Value Units Date/Time    CT Angio Chest (PE or trauma protocol) [[id number]] Collected:  12/17/15 0523    Order Status:  Completed Updated:  12/17/15 0542    Narrative:      TECHNIQUE: CT of the chest WITH contrast. ACR standard PE protocol. 90  mL IV Omnipaque 350 was administered. Coronal and sagittal reconstructed  MIP images are reviewed.    The following dose reduction techniques were utilized: Automated  exposure control and/or adjustment of the mA and/or kV according to  patient size, and the use of iterative reconstruction technique.    INDICATION: Chest pain. Sinus tachycardia.    COMPARISON: Outside CT chest 11/16/2013    FINDINGS:     LINES/TUBES: Left internal jugular catheter terminating at the  cavoatrial junction. Vascular stent is seen in the right subclavian vein  which is occluded. Multiple collateral vessels are seen in the soft  tissues of the right upper chest.    LUNGS/PLEURA: Multiple subcentimeter nodular opacities are seen in the  right upper lung, possibly pneumonia, new. Mild paraseptal emphysematous  changes. Calcified granuloma in the right lower lung. Trace right and  small left pleural effusions, new. Atelectasis at the lung bases. No  edema. No pneumothorax.      HEART: Not enlarged.  MEDIASTINUM:  Mediastinal adenopathy, largest is a right paratracheal  lymph node measuring 2.2 x 1.5 cm. Mild right axillary lymphadenopathy,  largest measuring 1.3 x 1.1 cm. No hilar or left  axillary adenopathy.  PULMONARY ARTERIES: No main pulmonary emboli. Evaluation of the lobar,  segmental and subsegmental branches is markedly suboptimal due to motion  artifact.  AORTA:  No aneurysm or dissection.    UPPER ABDOMEN:  Bilateral renal atrophy. Ascites is seen in the upper  abdomen. Cardiomegaly. Mitral annulus and coronary artery  calcifications. No pericardial effusion.    BONES AND SOFT TISSUES:  Soft tissue edema. Multiple collateral vessels  are seen in the soft tissues of the right upper chest.      Impression:        No main pulmonary emboli. Evaluation of the lobar, segmental and  subsegmental branches is markedly suboptimal due to motion artifact.    Multiple subcentimeter nodular opacities are seen in the right upper  lung, possibly pneumonia, new.     Trace right and small left pleural effusions, new.     Atelectasis at the lung bases.    Mediastinal and mild right axillary lymphadenopathy. Correlate  clinically for etiology.    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     Johnsie Kindred, MD   12/17/2015 5:38 AM      XR Chest  AP Portable [[id number]] Collected:  12/17/15 0342  Order Status:  Completed Updated:  12/17/15 0347    Narrative:      HISTORY: Chest pain    Portable image of the chest shows dialysis catheter tip extending to the  superior vena cava. Innominate stent is present. Heart size is enlarged.  Vascular pattern is normal. There is minimal basilar atelectasis. Small  effusions cannot be excluded. No pneumothorax is identified. No focal  consolidation is identified.      Impression:        1. Mild basilar atelectasis with probable small bilateral pleural  effusions.    Olen Pel, MD   12/17/2015 3:43 AM          Assessment:   Hypertension. Patient has stage IV hypertension despite the use of diuretics.  He also channel blocker, beta blockers, and hydralazine.  Reviewing the records, the patient has had significant hypertension that has remained  difficult to treat over the past 3-4 years.  Patient has been on carvedilol 25 mg for a number of years.  The patient is tachycardic today and still hypertensive.  I recommended discontinuing the carvedilol and starting metoprolol 100 mg twice daily for blood pressure and tachycardia.    2.  Chest discomfort.  The patient's physical exam is unremarkable.  There is no evidence of pulmonary embolus or aortic dissection.  The electrocardiogram shows nonspecific changes and is unchanged from the previous ECGs.  The troponins are normal.  The patient most likely has underlying coronary artery disease.  There is no evidence of acute ischemic insult.  I would recommending increasing the beta blockers as the patient is tachycardic.  I would consider transfusion as the patient remains anemic and tachycardic.  There is no indication for emergent catheterization.  A cardiogram will be obtained to look for wall motion abnormalities.    At this point.  Metoprolol will be increased to 100 mg twice daily, half inch of Nitropaste to be started for preload reduction.  Continuation of the amlodipine at current dose , if  blood pressure remains uncontrolled, increasing to 20 mg daily would be recommendeddifficult to control, then changing to minoxidil could also be considered.  The changes to minoxidil could also be considered.    At this point there is no indication for emergent catheterization.  Patient will be changed to metoprolol.  Nitropaste will be added.  Patient will be followed as needed    Plan:   See above    Signed by: Delaney Meigs, MD, [facility]  [facility]   [facility] [phone] or [phone] (M-F 8 am-5 pm)   Tyson Babinski [facility] Spectralink [phone]  (M-F 8 am-5 pm)  After Hours Consult Line: [phone]    cc:Pcp, Noneorunknown, MD

## 2015-12-17 NOTE — UM Notes (Signed)
PATIENT ADMITTED TO INPATIENT ON 8/30 AT 0630      History of Presenting Illness:   [name] is a 44 y.o. male with PMH HTN, CAD s/p PCI, remote hx of PE (not on chronic AC), Asthma, ESRD on HD MWF at [facility] in [place] and history of a c diff colitis presenting with cough, chest pain, N/V and diarrhea. Pt states his symptoms began yesterday with indigestion, N/V and then diarrhea with chest pain. He describes his chest pain as left sided heavy pressure with associated   shortness of breath and diaphoresis. His symptoms where not improved with Nitro in the ED but did resolved somewhat with IV dilaudid. He reports cough starting yesterday with yellow productive sputum and subjective fever/chills. He admits to hx of C.diff, recently off PO Vanc last month now with recurrence of watery foul smelling diarrhea, similar to what he has experienced in the past with C.diff. Pt reports he was told he would need a fecal transplant, but has not followed up and states he does not have a regular ID or GI doctor. Pt reports receiving HD on Monday as scheduled. Pt denies abdominal pain, melena, hematochezia, dysuria, hematuria. Pt states he is on Home O2 only during HD.              Plan:   #Chest pain, hx of CAD s/p PCI 2010  Cards consult (IMG)  Tele  Trend CE  CTA neg for PE  EKG stable, no ST changes  ASA  Nitro SL prn  Dilaudid prn (morphine allergy)  Cont Coreg  Echocardiogram 10/2015: mild to moderate pulmonary HTN, EF of 55%, RV function upper limits of normal, grade II diastolic dysfunction    #ESRD on HD MWF at [facility] in [place]   Renal consult (Dr Tereso Newcomer)  HD per renal  Cont Renvela     #HCAP, early PNA on CTA with productive sputum, afebrile without leukocytosis, hx of Asthma   Cont Vanc/Zosyn pending ID recs  Duonebs q6h  Wean O2 as tolerated  Bld Cx pending  Sputum Cx    #Diarrhea with hx of C.diff, recently on PO Vanc, per pt recommended for fecal transplant   ID consult  C.diff pending (most recent  C.diff 7/24 was negative)  Stool studies pending  Resume PO Vanc    #HTN  Cont Coreg 25mg  bid  Cont Norvasc 10mg  bid  Cont hydralazine 100mg  tid    #Hx of remote PE x1, not currently on [facility]  CTA chest neg for PE    Full code  DVT ppx heparin    Disposition:     Today's date: 12/17/2015  Admit Date: 12/17/2015 3:09 AM  Anticipated medical stability for discharge:Red - not tomorrow - estimated month/date: tbd  Service status: Inpatient: risk of morbidity and mortality and risk of progressive disease  Reason for ongoing hospitalization: HCAP, IV abx, HD, Chest pain, C.diff  Anticipated discharge needs: tbd           Francia Greaves, RN, BSN  UR Case Manager  [phone]  [email or web address]

## 2015-12-17 NOTE — Progress Notes (Signed)
Initial discharge planning assessment completed with patient  Patient is alert x 4  Patient stated he goes for outpt dialysis at [facility] and stated he is moving and needs a new center in [facility]  Left message for dialysis Osvaldo Angst at ext [phone]  Discharge plans to home  Case manager to follow  Beaulah Corin, RN, BSN   AGENCY TRAVEL NURSE ex     12/17/15 [phone]   Patient Type   Within 30 Days of Previous Admission? No   Healthcare Decisions   Interviewed: Patient   English as a second language teacher Information: patient   Orientation/Decision Making Abilities of Patient Alert and Oriented x3, able to make decisions   Advance Directive Patient has advance directive, copy not in chart   Advance Directive not in Chart Copy requested from family/decision maker   Healthcare Agent Appointed Yes   Healthcare Agent's Name patient sister Newt Minion   Healthcare Agent's Phone Number [phone]   Prior to admission   Prior level of function Independent with ADLs   Type of Residence Private residence   Home Layout One level   Have running water, electricity, heat, etc? Yes   Living Arrangements Alone   How do you get to your MD appointments? patient drives   How do you get your groceries? patient   Who fixes your meals? patient   Who does your laundry? patient   Who picks up your prescriptions? patient   Dressing Independent   Grooming Independent   Feeding Independent   Bathing Independent   Toileting Independent   DME Currently at Home (none)   Name of Prior Assisted Living Facility n/a   Home Care/Community Services Dialysis   Type of Dialysis: Hemodialysis   Where does patient receive dialysis? [name] Monday, Wednesday, Friday   How is patient transported to dialysis? Private car (family member)   Date of Last Dialysis: 12/15/15   Prior SNF admission? (Detail) n/a   Prior Rehab admission? (Detail) n/a   Adult Protective Services (APS) involved? No   Discharge Planning   Support Systems Family members    Patient expects to be discharged to: home   Anticipated [place] plan discussed with: Same as interviewed   [place] discussion contact information: patient   Follow up appointment scheduled? No   Reason no follow up scheduled? Patient declined;Family to schedule   Potential barriers to discharge: (none)   Mode of transportation: Private car (family member)   Consults/Providers   PT Evaluation Needed 2   OT Evalulation Needed 2   SLP Evaluation Needed 2   Outcome Palliative Care Screen Screened but did not meet criteria for intervention   Correct PCP listed in Epic? No (comment)  (pt stated he has a pcp, will get back with the inforamation)   Important Message from [facility] Notice   Patient received 1st IMM Letter? Yes   Date of most recent IMM given: 12/17/15   t [phone]

## 2015-12-17 NOTE — Progress Notes (Signed)
Patient is alert x 4, pt stated he goes for dialysis at [facility] in [facility] Monday, Wednesday, and Monday, pt states he drives himself, pt stated he is moving in a week or two to [place], does not have the address or zip code at this time, and requested a new dialysis center, called dialysis liason ext [phone] left message for kelly regarding the above.  Case manager to follow  Beaulah Corin, RN, BSN   AGENCY TRAVEL NURSE ext [phone]

## 2015-12-18 ENCOUNTER — Encounter: Payer: Self-pay | Admitting: Nurse Practitioner

## 2015-12-18 DIAGNOSIS — Z765 Malingerer [conscious simulation]: Secondary | ICD-10-CM

## 2015-12-18 DIAGNOSIS — R079 Chest pain, unspecified: Secondary | ICD-10-CM

## 2015-12-18 DIAGNOSIS — Z992 Dependence on renal dialysis: Secondary | ICD-10-CM

## 2015-12-18 DIAGNOSIS — I429 Cardiomyopathy, unspecified: Secondary | ICD-10-CM

## 2015-12-18 DIAGNOSIS — N186 End stage renal disease: Secondary | ICD-10-CM

## 2015-12-18 LAB — CBC
Absolute NRBC: 0 10*3/uL
Hematocrit: 25.9 % — ABNORMAL LOW (ref 42.0–52.0)
Hgb: 8 g/dL — ABNORMAL LOW (ref 13.0–17.0)
MCH: 28.8 pg (ref 28.0–32.0)
MCHC: 30.9 g/dL — ABNORMAL LOW (ref 32.0–36.0)
MCV: 93.2 fL (ref 80.0–100.0)
MPV: 9.5 fL (ref 9.4–12.3)
Nucleated RBC: 0 /100 WBC (ref 0.0–1.0)
Platelets: 121 10*3/uL — ABNORMAL LOW (ref 140–400)
RBC: 2.78 10*6/uL — ABNORMAL LOW (ref 4.70–6.00)
RDW: 16 % — ABNORMAL HIGH (ref 12–15)
WBC: 4.57 10*3/uL (ref 3.50–10.80)

## 2015-12-18 LAB — BASIC METABOLIC PANEL
BUN: 28 mg/dL (ref 9.0–28.0)
CO2: 24 mEq/L (ref 22–29)
Calcium: 6.7 mg/dL — ABNORMAL LOW (ref 8.5–10.5)
Chloride: 103 mEq/L (ref 100–111)
Creatinine: 7.6 mg/dL — ABNORMAL HIGH (ref 0.7–1.3)
Glucose: 107 mg/dL — ABNORMAL HIGH (ref 70–100)
Potassium: 4.3 mEq/L (ref 3.5–5.1)
Sodium: 138 mEq/L (ref 136–145)

## 2015-12-18 LAB — MAGNESIUM: Magnesium: 1.8 mg/dL (ref 1.6–2.6)

## 2015-12-18 LAB — VANCOMYCIN, RANDOM: Vancomycin Random: 31.1 ug/mL

## 2015-12-18 LAB — PHOSPHORUS: Phosphorus: 5.9 mg/dL — ABNORMAL HIGH (ref 2.3–4.7)

## 2015-12-18 LAB — TROPONIN I: Troponin I: 0.07 ng/mL (ref 0.00–0.09)

## 2015-12-18 LAB — GFR: EGFR: 9.4

## 2015-12-18 MED ORDER — GUAIFENESIN ER 600 MG PO TB12
600.0000 mg | ORAL_TABLET | Freq: Two times a day (BID) | ORAL | Status: DC
Start: 2015-12-18 — End: 2015-12-18

## 2015-12-18 MED ORDER — PREDNISONE 20 MG PO TABS
60.0000 mg | ORAL_TABLET | Freq: Once | ORAL | Status: DC
Start: 2015-12-18 — End: 2015-12-18
  Filled 2015-12-18: qty 3

## 2015-12-18 MED ORDER — METHYLPREDNISOLONE SODIUM SUCC 40 MG IJ SOLR
40.0000 mg | Freq: Once | INTRAMUSCULAR | Status: AC
Start: 2015-12-18 — End: 2015-12-18
  Administered 2015-12-18: 40 mg via INTRAVENOUS
  Filled 2015-12-18: qty 1

## 2015-12-18 MED ORDER — HYDROMORPHONE HCL 2 MG PO TABS
2.0000 mg | ORAL_TABLET | ORAL | Status: DC | PRN
Start: 2015-12-18 — End: 2015-12-18
  Filled 2015-12-18: qty 1

## 2015-12-18 NOTE — Progress Notes (Signed)
[facility] PROGRESS NOTE     Date Time:    12/18/2015    2:39 PM  Patient Name: LANDO ALCALDE    LOS: 1 day   Cc chest pain  Subjective:  No cardiac events.  Patient was not seen today, however, the chart was fully evaluated.  The patient is refusing various medications and refused a full run of dialysis.  The patient's blood pressure.  However, today is controlled.    Scheduled Meds:   albuterol-ipratropium 3 mL Nebulization Q6H SCH   amLODIPine 10 mg Oral Daily   aspirin EC 81 mg Oral Daily   darbepoetin alfa 100 mcg Subcutaneous Weekly   doxycycline 100 mg Oral Q12H SCH   furosemide 80 mg Oral Daily   heparin (porcine) 5,000 Units Subcutaneous Q8H SCH   hydrALAZINE 100 mg Oral Q8H SCH   metoprolol 100 mg Oral Q12H SCH   nitroglycerin 0.5 inch Topical Q6H HOLD MN   pantoprazole 40 mg Oral Daily   sevelamer 800 mg Oral TID MEALS   vancomycin 125 mg Oral 4 times per day     Continuous Infusions:    PRN Meds: acetaminophen, HYDROmorphone, naloxone, nitroglycerin, nitroglycerin, ondansetron **OR** ondansetron    Objective:  Physical Exam:  BP 147/84 mmHg  Pulse 102  Temp(Src) 97.9 F (36.6 C) (Oral)  Resp 25  Ht 1.854 m (6\' 1" )  Wt 97.1 kg (214 lb 1.1 oz)  BMI 28.25 kg/m2  SpO2 98%     Intake/Output Summary (Last 24 hours) at 12/18/15 1439  Last data filed at 12/17/15 2300   Gross per 24 hour   Intake   1000 ml   Output   2380 ml   Net  -1380 ml      Wt Readings from Last 3 Encounters:   12/18/15 97.1 kg (214 lb 1.1 oz)   11/12/15 110.8 kg (244 lb 4.3 oz)   10/18/15 118.8 kg (261 lb 14.5 oz)      G  Lab Review:   Recent Labs      12/18/15   0400  12/17/15   0528  12/17/15   0329   WBC  4.57   --   9.04   HGB  8.0*  8.1*  4.7*   HEMATOCRIT  25.9*  26.3*  15.1*   PLATELETS  121*   --   178     Recent Labs      12/18/15   0400  12/17/15   1649  12/17/15   0924  12/17/15   0326   TROPONIN I  0.07  0.07  0.07   --    I-STAT TROPONIN   --    --    --   0.02    Recent Labs      12/18/15   0400   12/17/15   0329   SODIUM  138  137   POTASSIUM  4.3  4.3   CO2  24  24   BUN  28.0  31.0*   CREATININE  7.6*  8.3*   GLUCOSE  107*  83   MAGNESIUM  1.8  1.7    Estimated Creatinine Clearance: 15.1 mL/min (based on Cr of 7.6).   No results for input(s): INR, TSH, CHOL, TRIG, HDL, LDL in the last 72 hours.     Telemetry: Normal sinus rhythm    Imaging:  Radiology Results (24 Hour)     ** No results found for the last 24 hours. **  The patient is refusing various medications and procedures.  Cardiac enzymes have not revealed any evidence of coronary ischemia.  The echocardiogram is pending at this point.  The patient's blood pressure is stable.  Reviewing the nursing notes, however, the patient is not fully compliant with his current regimen.  It is suspected that there has been noncompliance with his degree of hypertension in the past.  At this point due to noncompliance.  No further cardiac workup is indicated.  The above antihypertensive regimen would be appropriate if the  patient would be compliant      Signed by: Delaney Meigs, MD, [facility]  [facility]   [place] [phone] or [phone] (M-F 8 am-5 pm)   Tyson Babinski [facility] Spectralink [phone]  (M-F 8 am-5 pm)  After Hours Consult Line: [phone]

## 2015-12-18 NOTE — Progress Notes (Signed)
ID PROGRESS NOTE      Date Time: 12/18/2015 8:05 AM  Patient Name: [name], [name]    Problem List:    Acute Problem List:   Presents with L sided chest pain  CT chest with RUL nodules, possible PNA.   --New respiratory symptoms  Multiple episodes of C.diff. Last confirmed positive result in 09/2015  --Recurrence of diarrhea  Hospital visits to multiple hospital systems in the region, usually end with pt leaving AMA due to insufficient pain medications.   ESRD on HD. Currently via L chest permacath as LUE fistula is non-functional  Has been on PD in the past  ID History:   No antibiotic allergies   Chronic Conditions:  HTN  Asthma  CAD  PE  Clotted R subclavian stent with collaterals on CT scan    Assessment:   45 yo M with Hx of ESRD on HD and opiate dependence/seeking presents with L sided chest pain. RUL nodules discovered on CT chest. Per read these may represent PNA, particularly in light of new respiratory Sx. Blood cx are pending. In light of HD and Permacath, septic emboli are possible. He is also experiencing a recurrence of his GI symptoms suggestive of C.diff colitis and has had numerous episodes. Positive results documented at an outside hospital by PCR. Multiple other tests across other centers are negative. Frequency of admissions and various hospital systems involved suggest possibility of secondary gain. Systemic Abx should be limited as far as possible in light of his C.diff Hx. Reasonable to consult GI to discuss a stool transplant as this has been suggested to the pt in the past    Afebrile, WBC~4K ; Plt decreased (178-->121K)  Cr~8  Lactic Acid=1.0  Vancomycin level=31 this morning  HBV Ab + (~54) ; Immune  Blood Cultures from 12/17/15 NGTD  Cl diff negative  MRSA Screen sent and pending    Antibiotics:   #3 Vancomycin IV by random levels  #2 Doxycycline 100mg  PO BID  #2 Vancomycin 125 mg PO q 6    Plan:   Hold additional IV Vancomycin given negative cultures  (preliminary), and level>30  Discontinue enteral Vancomycin  Continue Doxycycline targeting pulmonary process      Lines:     Patient Lines/Drains/Airways Status    Active PICC Line / CVC Line / PIV Line / Drain / Airway / Intraosseous Line / Epidural Line / ART Line / Line / Wound / Pressure Ulcer / NG/OG Tube     Name:   Placement date:   Placement time:   Site:   Days:    Permacath/Temporary Catheter Permacath Left               Permacath/Temporary Catheter Permacath Left               Peripheral IV 12/17/15 Right Upper Arm  12/17/15   0335   Upper Arm   1    Graft/Fistula Right              Graft/Fistula Left                          *I have performed a risk-benefit analysis and the patient needs a central line for access and IV medications    Family History:     Family History   Problem Relation Age of Onset   . Hypertension Mother        Social History:     Social History  Social History   . Marital Status: Single     Spouse Name: N/A   . Number of Children: N/A   . Years of Education: N/A     Social History Main Topics   . Smoking status: Former Smoker -- 0.50 packs/day     Types: Cigarettes     Quit date: 09/18/2015   . Smokeless tobacco: Former Neurosurgeon   . Alcohol Use: No   . Drug Use: No   . Sexual Activity: Not on file     Other Topics Concern   . Not on file     Social History Narrative       Allergies:     Allergies   Allergen Reactions   . Lisinopril Anaphylaxis   . Morphine Anaphylaxis   . Toradol [Ketorolac Tromethamine]        Review of Systems:   No acute events  Still C/O chest discomfort with movement  PD catheter site in LLQ of abdomen tender without drainage    Physical Exam:     Filed Vitals:    12/18/15 0600   BP: 170/101   Pulse: 101   Temp:    Resp: 20   SpO2: 100%       General Appearance: alert and appropriate, weak, pale  Neuro: alert, oriented, normal speech, normal attention and cognition  HEENT: no scleral icterus, pupils round and reactive, OP clear  Neck: supple  Lungs: L>R Crackles  without E-->A changes  Cardiac: normal rate, regular rhythm, normal S1, S2,   Abdomen: soft, mildly tender at prior PD Catheter removal site without drainage nor erythema, non-distended, normal active bowel sounds  Extremities: no pedal edema  Skin: no rash  Psych: improved mood per Patient    Labs:     Lab Results   Component Value Date    WBC 4.57 12/18/2015    HGB 8.0* 12/18/2015    HCT 25.9* 12/18/2015    MCV 93.2 12/18/2015    PLT 121* 12/18/2015     Lab Results   Component Value Date    CREAT 7.6* 12/18/2015     Lab Results   Component Value Date    ALT 7 12/17/2015    AST 13 12/17/2015    ALKPHOS 287* 12/17/2015    BILITOTAL 0.5 12/17/2015     Lab Results   Component Value Date    LACTATE 0.6 11/09/2015       Microbiology:     Microbiology Results     Procedure Component Value Units Date/Time    Blood Culture Aerobic/Anaerobic #1 [[id number]] Collected:  12/17/15 0328    Specimen Information:  Arm from Blood Updated:  12/18/15 0621    Narrative:      ORDER#: [id number]                                    ORDERED BY: Guy Franco, GREGORY  SOURCE: Blood arm                                    COLLECTED:  12/17/15 03:28  ANTIBIOTICS AT COLL.:                                RECEIVED :  12/17/15 05:22  Culture Blood Aerobic and Anaerobic  PRELIM      12/18/15 06:21  12/18/15   No Growth after 1 day/s of incubation.      Blood Culture Aerobic/Anaerobic #2 [[id number]] Collected:  12/17/15 0328    Specimen Information:  Arm from Blood Updated:  12/18/15 0621    Narrative:      ORDER#: [id number]                                    ORDERED BY: Guy Franco, GREGORY  SOURCE: Blood arm                                    COLLECTED:  12/17/15 03:28  ANTIBIOTICS AT COLL.:                                RECEIVED :  12/17/15 05:22  Culture Blood Aerobic and Anaerobic        PRELIM      12/18/15 06:21  12/18/15   No Growth after 1 day/s of incubation.      CULTURE + Dierdre Forth [[id number]] Collected:  12/17/15 1226     Specimen Information:  Sputum from Sputum, Expectorated Updated:  12/17/15 1636    Narrative:      ORDER#: [id number]                                    ORDERED BY: Christian Mate  SOURCE: Sputum, Expectorated sputum                  COLLECTED:  12/17/15 12:26  ANTIBIOTICS AT COLL.:                                RECEIVED :  12/17/15 14:37  Stain, Gram (Respiratory)                  FINAL       12/17/15 16:36  12/17/15   Moderate WBC's             No organisms seen  Culture and Gram Stain, Aerobic, RespiratorPENDING      Clostridium difficile toxin B PCR [[id number]] Collected:  12/17/15 1226    Specimen Information:  Stool from Stool Updated:  12/17/15 2203     Clostridium Difficile Toxin B PCR --      Result:        Negative  Test performed using a BDMax PCR Assay. Due to the high  sensitivity of the assay, repeat testing is not recommended  and will be cancelled following [place] policy. Molecular  detection of C. difficile is not recommended as a test of cure  or for surveillance purposes. A positive test may indicate  infection or colonization with C. difficile. Testing will not  be performed on formed stool.      Narrative:      Patient's current admission began:->less than or equal to 3  days ago  Cancel C-Diff order if no diarrhea in 12 hours?->Yes  DELAY AFFECTS RESULT    MRSA culture [[id number]] Collected:  12/17/15 0916    Specimen Information:  Body Fluid from Nares and Throat Updated:  12/17/15 1400  Stool for Salm/Shig/Campy/Shiga PCR [[id number]] Collected:  12/17/15 1226    Specimen Information:  Stool Updated:  12/17/15 2303    Narrative:      ORDER#: [id number]                                    ORDERED BY: Christian Mate  SOURCE: Stool                                        COLLECTED:  12/17/15 12:26  ANTIBIOTICS AT COLL.:                                RECEIVED :  12/17/15 14:28  Salmonella species by PCR                  FINAL       12/17/15 23:03  12/17/15   Negative  Shigella species/EIEC by PCR                FINAL       12/17/15 23:03  12/17/15   Negative  Campylobacter jejuni/coli by PCR           FINAL       12/17/15 23:03  12/17/15   Negative  Shiga Toxin by PCR                         FINAL       12/17/15 23:02  12/17/15   Negative                 Testing performed using a BDMax multiplex PCR panel which             detects bacterial DNA. A positive result does not necessarily             indicate the presence of viable organisms. Positive results             do not rule out co-infection with other organisms that are             not detected by this test. This panel does not differentiate             between Shigella spp. and Enteroinvasive Escherichia coli             (EIEC), which are closely related and may cause similar             illness.             Culture for serotyping of Salmonella, Shigella, or Shiga             Toxin DNA positive samples will be attempted if PCR positive,             see separate report from [facility] for final results.            Rads:   No results found.    Signed by: Tonita Phoenix., MD

## 2015-12-18 NOTE — Progress Notes (Signed)
Called dialysis liason ext [phone] spoke to [facility] she stated she will speak to pt regarding needing a new dialysis center  Case manager to follow  Beaulah Corin, RN, BSN   AGENCY TRAVEL NURSE ext [phone]

## 2015-12-18 NOTE — Progress Notes (Signed)
MEDICINE PROGRESS NOTE    Date Time: 12/18/2015 3:34 PM  Patient Name: [name]  Attending Physician: Della Goo, MD P*    Assessment:   Principal Problem:    HAP (hospital-acquired pneumonia)  Active Problems:    ESRD (end stage renal disease) on dialysis    Hypertension    Nausea & vomiting    Diarrhea    Hospital-acquired pneumonia  Resolved Problems:    * No resolved hospital problems. *      [name] is a 45 y.o. male  45 yo male with PMH HTN, CAD s/p PCI, remote hx of PE (not on chronic AC), Asthma, ESRD on HD MWF at [facility] in [place] and history of a c diff colitis presenting with cough, chest pain, N/V and diarrhea.     Plan:     #left sided chest pain  -worse with deep breath, consider pleuritis or pericarditis  -refused prednisone as it "makes me puffy"  -solumedrol 125mg  IV not helpful per pt  -CE x 4 neg    -echo results pending  -cardiology Dr Alois Cliche no other recommendations other that HTN medication compliance    #occluded LIJ stent  -per pt this is new finding  - attempts to obtain nephrologist name for clarification, ML at [facility] dialysis unit [phone])    #cough  -productive yellow, no leucocytosis  -prelim sputum culture mod WBC, moderate growtn mixed upper respiratory flora  -continue duonebs and doxycycline  -add mucinex    # n/v/d/history of c dif  -stool c dif and enteric path neg  -appreciate ID rec's to d/c vanc and continue doxy po for chest sx    #noncompliance/suspect drug seeking  -frequent dilaudid IV use as soon as due  -refusing full HD and medications   -[place] PMP reveals various providers with #20 tabs approx q52mo  -will change dilaludid 0.4mg  IV q3h to 2mg  tab q4h    #ESRD on HD  -only 2 of 3 hours dialysis last nite per pt request to stop  -reattempt again later tonight    Downgrade from [facility] to general medicine bed    Case discussed with: pt, RN, Dr Carolin Coy    Safety Checklist:     DVT prophylaxis:  CHEST guideline (See page [id number]) Chemical  and Mechanical   Foley:  [place] Rn Foley protocol Not present   IVs:  Peripheral IV   PT/OT: Not needed   Daily CBC & or Chem ordered:  SHM/ABIM guidelines (see #5) No   Reference for approximate charges of common labs: CBC auto diff - $76  BMP - $99  Mg - $79    Lines:     Patient Lines/Drains/Airways Status    Active PICC Line / CVC Line / PIV Line / Drain / Airway / Intraosseous Line / Epidural Line / ART Line / Line / Wound / Pressure Ulcer / NG/OG Tube     Name:   Placement date:   Placement time:   Site:   Days:    Permacath/Temporary Catheter Permacath Left               Permacath/Temporary Catheter Permacath Left               Peripheral IV 12/17/15 Right Upper Arm  12/17/15   0335   Upper Arm   1    Graft/Fistula Right              Graft/Fistula Left  Disposition: (Please see PAF column for Expected D/C Date)   Today's date: 12/18/2015  Admit Date: 12/17/2015  3:09 AM  LOS: 1  Clinical Milestones: nl echo  Anticipated discharge needs: OP PCP f/u      Subjective     CC: HAP (hospital-acquired pneumonia)    Review of Systems:     NAD    Physical Exam:     VITAL SIGNS PHYSICAL EXAM   Temp:  [97.9 F (36.6 C)-98.7 F (37.1 C)] 97.9 F (36.6 C)  Heart Rate:  [100-114] 102  Resp Rate:  [14-32] 25  BP: (147-198)/(78-113) 147/84 mmHg        Intake/Output Summary (Last 24 hours) at 12/18/15 1534  Last data filed at 12/17/15 2300   Gross per 24 hour   Intake    750 ml   Output   2380 ml   Net  -1630 ml    Physical Exam  General: awake, alert, comfortable sitting in chair watching TV  Cardiovascular: regular rate and rhythm, no murmurs, rubs or gallops  Lungs: wheezing bilaterally, without , rhonchi, or rales; deep breath causes cp   Abdomen: soft, non-tender, non-distended; no palpable masses,  normoactive bowel sounds  Extremities: no edema         Meds:     Medications were reviewed:    Labs:     Labs (last 72 hours):      Recent Labs  Lab 12/18/15  0400 12/17/15  0528 12/17/15  0329    WBC 4.57  --  9.04   HGB 8.0* 8.1* 4.7*   HEMATOCRIT 25.9* 26.3* 15.1*   PLATELETS 121*  --  178            Recent Labs  Lab 12/18/15  0400 12/17/15  0329   SODIUM 138 137   POTASSIUM 4.3 4.3   CHLORIDE 103 100   CO2 24 24   BUN 28.0 31.0*   CREATININE 7.6* 8.3*   CALCIUM 6.7* 6.8*   ALBUMIN  --  2.6*   PROTEIN, TOTAL  --  5.9*   BILIRUBIN, TOTAL  --  0.5   ALKALINE PHOSPHATASE  --  287*   ALT  --  7   AST (SGOT)  --  13   GLUCOSE 107* 83                   Signed by: Rockwell [place], NP

## 2015-12-18 NOTE — Plan of Care (Signed)
Problem: Pain  Goal: Pain at adequate level as identified by patient  Outcome: Not Progressing  Pt refusing interventions and some medications throughout the day. Pt refusing bed alarms, stating "I am a grown man. I don't need an alarm." RN reinforced teaching about safety and medications. Physician changed IV Dilaudid 0.4 mL q2 to PO Dilaudid 2 mg q4. Pt stated he did not want to take the PO Dilaudid and requested AMA form, despite teachings. Physician notified. Patient left AMA with all belongings, escorted by transport services per patient's request.

## 2015-12-18 NOTE — Plan of Care (Addendum)
Problem: Side Effects from Pain Analgesia  Goal: Patient will experience minimal side effects of analgesic therapy  Outcome: Progressing  Care Plan Notes  [facility] Nursing Progress Note    [name] is a 45 y.o. male  Admitted 12/17/2015  3:09 AM [facility] day 1)    Indication for continued [facility] status:  Monitor respiratory status and hemodynamic status.    Major Shift Events:  Pt declining interventions and medications overnight despite patient education. Declined troponin blood draw at beginning of shift and c/o "crushing" chest pain. Pt agitated and stated, "Leave me alone, I am agitated where's my pain medication? I've had enough." NP [place] notified of pt's chest pain and came to bedside to evaluate and speak to patient. Pt more calm and cooperative afterwards.     Review of Systems  Neuro:  neurologically intact. A&Ox4, FC, MAE, PERRLA. Declines interventions/medications. Excitable at times.     Cardiac:  ST with HR 100s-110s, SBP 160s-180s, pt c/o "crushing" chest pain-- NP notified, no new orders at this time. PRN Dilaudid given approximately Q2-3. Pulses palpable. Afebrile.     Respiratory:  Pt weaned to 2L NC and removes it at times with no desaturations. Expiratory wheezes auscultated.     GI/GU:  Pt is anuric. Per pt report, he does not void.    BM this shift? No    Skin Assessment  Pt has tattoos on upper back. Staples noted on LLQ on abdomen. UTA sacrum as pt refused to let RN assess.       LDAs  Patient Lines/Drains/Airways Status    Active Lines, Drains and Airways      Name:   Placement date:   Placement time:   Site:   Days:     Permacath/Temporary Catheter Permacath Left                     Permacath/Temporary Catheter Permacath Left                     Peripheral IV 12/17/15 Right Upper Arm   12/17/15    0335    Upper Arm    1     Graft/Fistula Right                     Graft/Fistula Left                                       Indication for Central Access and estimated target removal  date?  Permacath for dialysis.    Indication for Foley and estimated target removal date?  N/A    Psycho/Social:  Pt calmer and asleep for the later part of the night. Can get agitated at times regarding interventions. Pt declined CHG bath and bed alarm overnight-- reinforced use of call light and fall prevention.

## 2015-12-19 ENCOUNTER — Emergency Department: Admit: 2015-12-19 | Payer: MEDICARE

## 2015-12-19 ENCOUNTER — Inpatient Hospital Stay
Admit: 2015-12-19 | Discharge: 2015-12-19 | Disposition: A | Discharge status: Home / Self Care | Payer: MEDICARE | Attending: Emergency Medicine

## 2015-12-19 ENCOUNTER — Emergency Department

## 2015-12-19 DIAGNOSIS — K529 Noninfective gastroenteritis and colitis, unspecified: Secondary | ICD-10-CM

## 2015-12-19 LAB — CBC WITH AUTOMATED DIFF
ABS. BASOPHILS: 0 10*3/uL (ref 0.0–0.1)
ABS. EOSINOPHILS: 0 10*3/uL (ref 0.0–0.4)
ABS. LYMPHOCYTES: 0.2 10*3/uL — ABNORMAL LOW (ref 0.8–3.5)
ABS. MONOCYTES: 0.1 10*3/uL (ref 0.0–1.0)
ABS. NEUTROPHILS: 5 10*3/uL (ref 1.8–8.0)
BASOPHILS: 0 % (ref 0–1)
EOSINOPHILS: 0 % (ref 0–7)
HCT: 26 % — ABNORMAL LOW (ref 36.6–50.3)
HGB: 8.1 g/dL — ABNORMAL LOW (ref 12.1–17.0)
LYMPHOCYTES: 3 % — ABNORMAL LOW (ref 12–49)
MCH: 28.3 PG (ref 26.0–34.0)
MCHC: 31.2 g/dL (ref 30.0–36.5)
MCV: 90.9 FL (ref 80.0–99.0)
MONOCYTES: 1 % — ABNORMAL LOW (ref 5–13)
NEUTROPHILS: 96 % — ABNORMAL HIGH (ref 32–75)
PLATELET: 131 10*3/uL — ABNORMAL LOW (ref 150–400)
RBC: 2.86 M/uL — ABNORMAL LOW (ref 4.10–5.70)
RDW: 15.8 % — ABNORMAL HIGH (ref 11.5–14.5)
WBC: 5.3 10*3/uL (ref 4.1–11.1)

## 2015-12-19 LAB — NT-PRO BNP: NT pro-BNP: >30000 PG/ML — ABNORMAL HIGH (ref 0–125)

## 2015-12-19 LAB — PTT: aPTT: 32.9 s — ABNORMAL HIGH (ref 22.1–32.5)

## 2015-12-19 LAB — D DIMER: D-dimer: 3.35 mg/L FEU — ABNORMAL HIGH (ref 0.00–0.65)

## 2015-12-19 LAB — CK W/ CKMB & INDEX
CK - MB: 20.9 NG/ML — ABNORMAL HIGH (ref <3.6)
CK-MB Index: 3 — ABNORMAL HIGH (ref 0–2.5)
CK: 691 U/L — ABNORMAL HIGH (ref 39–308)

## 2015-12-19 LAB — METABOLIC PANEL, COMPREHENSIVE
A-G Ratio: 0.6 — ABNORMAL LOW (ref 1.1–2.2)
ALT (SGPT): 11 U/L — ABNORMAL LOW (ref 12–78)
AST (SGOT): 14 U/L — ABNORMAL LOW (ref 15–37)
Albumin: 2.4 g/dL — ABNORMAL LOW (ref 3.5–5.0)
Alk. phosphatase: 271 U/L — ABNORMAL HIGH (ref 45–117)
Anion gap: 13 mmol/L (ref 5–15)
BUN/Creatinine ratio: 4 — ABNORMAL LOW (ref 12–20)
BUN: 39 MG/DL — ABNORMAL HIGH (ref 6–20)
Bilirubin, total: 0.4 MG/DL (ref 0.2–1.0)
CO2: 24 mmol/L (ref 21–32)
Calcium: 6.8 MG/DL — ABNORMAL LOW (ref 8.5–10.1)
Chloride: 102 mmol/L (ref 97–108)
Creatinine: 9.06 MG/DL — ABNORMAL HIGH (ref 0.70–1.30)
GFR est AA: 8 mL/min/{1.73_m2} — ABNORMAL LOW (ref >60)
GFR est non-AA: 6 mL/min/{1.73_m2} — ABNORMAL LOW (ref >60)
Globulin: 4.2 g/dL — ABNORMAL HIGH (ref 2.0–4.0)
Glucose: 167 mg/dL — ABNORMAL HIGH (ref 65–100)
Potassium: 4.9 mmol/L (ref 3.5–5.1)
Protein, total: 6.6 g/dL (ref 6.4–8.2)
Sodium: 139 mmol/L (ref 136–145)

## 2015-12-19 LAB — PROTHROMBIN TIME + INR
INR: 1.2 — ABNORMAL HIGH (ref 0.9–1.1)
Prothrombin time: 11.9 s — ABNORMAL HIGH (ref 9.0–11.1)

## 2015-12-19 LAB — TROPONIN I: Troponin-I, Qt.: 0.05 ng/mL — ABNORMAL HIGH (ref <0.05)

## 2015-12-19 LAB — D-DIMER, QUANTITATIVE: D-Dimer, Quant: 3.35 mg/L FEU — ABNORMAL HIGH (ref 0.00–0.65)

## 2015-12-19 MED ORDER — SODIUM CHLORIDE 0.9 % IJ SYRG
Freq: Once | INTRAMUSCULAR | Status: AC
Start: 2015-12-19 — End: 2015-12-19
  Administered 2015-12-19: 10:00:00 via INTRAVENOUS

## 2015-12-19 MED ORDER — HYDROMORPHONE (PF) 1 MG/ML IJ SOLN
1 mg/mL | INTRAMUSCULAR | Status: AC
Start: 2015-12-19 — End: 2015-12-19
  Administered 2015-12-19: 08:00:00 via INTRAVENOUS

## 2015-12-19 MED ORDER — ALBUTEROL SULFATE HFA 90 MCG/ACTUATION AEROSOL INHALER
90 mcg/actuation | Freq: Four times a day (QID) | RESPIRATORY_TRACT | 0 refills | Status: DC | PRN
Start: 2015-12-19 — End: 2016-03-05

## 2015-12-19 MED ORDER — IPRATROPIUM-ALBUTEROL 2.5 MG-0.5 MG/3 ML NEB SOLUTION
2.5 mg-0.5 mg/3 ml | RESPIRATORY_TRACT | Status: AC
Start: 2015-12-19 — End: 2015-12-19
  Administered 2015-12-19: 08:00:00 via RESPIRATORY_TRACT

## 2015-12-19 MED ORDER — METHYLPREDNISOLONE 4 MG TABS IN A DOSE PACK
4 mg | ORAL | 0 refills | Status: DC
Start: 2015-12-19 — End: 2016-03-10

## 2015-12-19 MED ORDER — METHYLPREDNISOLONE (PF) 40 MG/ML IJ SOLR
40 mg/mL | INTRAMUSCULAR | Status: AC
Start: 2015-12-19 — End: 2015-12-19
  Administered 2015-12-19: 08:00:00 via INTRAVENOUS

## 2015-12-19 MED ORDER — HYDROMORPHONE (PF) 1 MG/ML IJ SOLN
1 mg/mL | INTRAMUSCULAR | Status: AC
Start: 2015-12-19 — End: 2015-12-19
  Administered 2015-12-19: 10:00:00 via INTRAVENOUS

## 2015-12-19 MED ORDER — IOPAMIDOL 76 % IV SOLN
370 mg iodine /mL (76 %) | Freq: Once | INTRAVENOUS | Status: AC
Start: 2015-12-19 — End: 2015-12-19
  Administered 2015-12-19: 10:00:00 via INTRAVENOUS

## 2015-12-19 MED ORDER — ONDANSETRON (PF) 4 MG/2 ML INJECTION
4 mg/2 mL | INTRAMUSCULAR | Status: AC
Start: 2015-12-19 — End: 2015-12-19
  Administered 2015-12-19: 08:00:00 via INTRAVENOUS

## 2015-12-19 MED ORDER — SODIUM CHLORIDE 0.9% BOLUS IV
0.9 % | Freq: Once | INTRAVENOUS | Status: AC
Start: 2015-12-19 — End: 2015-12-19
  Administered 2015-12-19: 10:00:00 via INTRAVENOUS

## 2015-12-19 MED FILL — SODIUM CHLORIDE 0.9 % IV: INTRAVENOUS | Qty: 100

## 2015-12-19 MED FILL — ONDANSETRON (PF) 4 MG/2 ML INJECTION: 4 mg/2 mL | INTRAMUSCULAR | Qty: 4

## 2015-12-19 MED FILL — ALBUTEROL SULFATE 0.083 % (0.83 MG/ML) SOLN FOR INHALATION: 2.5 mg /3 mL (0.083 %) | RESPIRATORY_TRACT | Qty: 1

## 2015-12-19 MED FILL — HYDROMORPHONE (PF) 1 MG/ML IJ SOLN: 1 mg/mL | INTRAMUSCULAR | Qty: 1

## 2015-12-19 MED FILL — SOLU-MEDROL (PF) 40 MG/ML SOLUTION FOR INJECTION: 40 mg/mL | INTRAMUSCULAR | Qty: 1

## 2015-12-19 MED FILL — NORMAL SALINE FLUSH 0.9 % INJECTION SYRINGE: INTRAMUSCULAR | Qty: 10

## 2015-12-19 MED FILL — ISOVUE-370  76 % INTRAVENOUS SOLUTION: 370 mg iodine /mL (76 %) | INTRAVENOUS | Qty: 100

## 2015-12-19 NOTE — ED Notes (Signed)
Pt resting on stretcher. Pt rates 9/10 pain. Pain meds administered. Will reevalute pain in 15-30 min.

## 2015-12-19 NOTE — ED Notes (Signed)
Patient discharged by MD. Results and discharge instructions reviewed with the patient by MD. Patient verbalizes understanding. Patient discharged home, stable, ambulatory, in no acute distress.

## 2015-12-19 NOTE — ED Triage Notes (Signed)
Patient arrives to ED with c/o chest pain since @ 2300 last night, states pain is like pressure to his upper left chest, with numbness to left arm, + nausea, vomiting  And diarrhea.

## 2015-12-19 NOTE — ED Provider Notes (Signed)
HPI Comments: 45 y.o. male with past medical history significant for MI (2010), CKD, HTN, Asthma, Peritonitis, Ascites who presents from home with chief complaint of chest pain. Pt reports 4 hour hx of diffuse chest pain. Pt describes the pain as "sharp" accompanied nausea, vomiting and diarrhea. He has been coughing and wheezing. Pt also notes that he had a fever yesterday (tmax: 101.0) at which time he took Tylenol for symptoms. He is a hemodialysis pt (last dialysis: Wednesday, due today). Pt denies congestion, SOB, blood in stool, abdominal pain, chills, headache, numbness, weakness, lightheadedness, acute neck pain or back pain. There are no other acute medical concerns at this time.     Chart review: Echo 10/15/15 - EF 55%-60%. Pt evaluated in ED 11/21/15 for peritonitis. Peritoneal fluid was bloody s/p draining off ascites. Zosyn started prior to pt leaving AMA.     No cardiologist.  PCP: None    Note written by Terie Purser, Scribe, as dictated by Loni Beckwith, MD 3:49 AM    The history is provided by the patient. No language interpreter was used.        Past Medical History:   Diagnosis Date   ??? Ascites    ??? Asthma    ??? C. difficile colitis    ??? CKD (chronic kidney disease) stage V requiring chronic dialysis (HCC)    ??? HTN (hypertension)    ??? SBP (spontaneous bacterial peritonitis) (HCC)        History reviewed. No pertinent surgical history.      History reviewed. No pertinent family history.    Social History     Social History   ??? Marital status: UNKNOWN     Spouse name: N/A   ??? Number of children: N/A   ??? Years of education: N/A     Occupational History   ??? Not on file.     Social History Main Topics   ??? Smoking status: Heavy Tobacco Smoker     Packs/day: 0.50   ??? Smokeless tobacco: Never Used   ??? Alcohol use No   ??? Drug use: No   ??? Sexual activity: Not on file     Other Topics Concern   ??? Not on file     Social History Narrative         ALLERGIES: Lisinopril; Morphine; and Toradol [ketorolac]     Review of Systems   Constitutional: Positive for fever.   HENT: Negative for congestion and sore throat.    Eyes: Negative for photophobia.   Respiratory: Positive for cough. Negative for shortness of breath.    Cardiovascular: Positive for chest pain. Negative for leg swelling.   Gastrointestinal: Positive for diarrhea, nausea and vomiting. Negative for abdominal pain, blood in stool and constipation.   Genitourinary: Negative for difficulty urinating, dysuria and hematuria.   Musculoskeletal: Negative for back pain and neck pain.   Skin: Negative for rash.   Neurological: Negative for dizziness, weakness, numbness and headaches.   All other systems reviewed and are negative.      Vitals:    12/19/15 0332   BP: (!) 173/96   Pulse: (!) 106   Resp: 20   Temp: 97.9 ??F (36.6 ??C)   SpO2: 96%            Physical Exam   Nursing note and vitals reviewed.     CONSTITUTIONAL: Well-appearing; well-nourished; in mild distress  HEAD: Normocephalic; atraumatic  EYES: PERRL; EOM intact; conjunctiva and sclera are clear bilaterally.  ENT: No rhinorrhea; normal pharynx with no tonsillar hypertrophy; mucous membranes pink/moist, no erythema, no exudate.  NECK: Supple; non-tender; no cervical lymphadenopathy  CARD: Normal S1, S2; no murmurs, rubs, or gallops. Increased Regular rate and rhythm.  RESP: Normal respiratory effort; Coarse breath sounds bilaterally with exp. wheezes; no rhonchi, or rales.   ABD: Normal bowel sounds; non-distended; non-tender; no palpable organomegaly, no masses, no bruits.  Back Exam: Normal inspection; no vertebral point tenderness, no CVA tenderness. Normal range of motion.  EXT: Normal ROM in all four extremities; non-tender to palpation; no swelling or deformity; distal pulses are normal, no edema.  SKIN: Warm; dry; no rash.  NEURO:Alert and oriented x 3, coherent, NII-XII grossly intact, sensory and motor are non-focal.      MDM  Number of Diagnoses or Management Options  Chronic pain syndrome:    CKD (chronic kidney disease) stage V requiring chronic dialysis (HCC):   Drug-seeking behavior:   Gastroenteritis:   Mild intermittent asthma with acute exacerbation:   Tobacco abuse:   Diagnosis management comments: Assessment: 45 year old male, who presents to the ED with atypical symptoms of chest pain that is palpable on both reducible and exam consistent with pleurisy and asthma exacerbation rule out pneumonia. The patient has chronic gastroenteritis. He appears hemodynamically stable although mildly hypertensive. He looks well.      Plan: EKG/ chest x-ray/ lab/ analgesia/ DuoNeb/ steroids/ CTA chest/ tobacco cessation. Education/ serial exam/ Monitor and Reevaluate.         Amount and/or Complexity of Data Reviewed  Clinical lab tests: ordered and reviewed  Tests in the radiology section of CPT??: ordered and reviewed  Tests in the medicine section of CPT??: ordered and reviewed  Discussion of test results with the performing providers: yes  Decide to obtain previous medical records or to obtain history from someone other than the patient: yes  Obtain history from someone other than the patient: yes  Review and summarize past medical records: yes  Discuss the patient with other providers: yes  Independent visualization of images, tracings, or specimens: yes    Risk of Complications, Morbidity, and/or Mortality  Presenting problems: moderate  Diagnostic procedures: moderate  Management options: moderate      ED Course       Procedures     ED EKG interpretation:  Rhythm: sinus tachycardia; and regular . Rate (approx.): 108; Axis: left axis deviation; P wave: normal; QRS interval: normal ; ST/T wave: non-specific changes; in  Lead: Diffusely; Other findings: abnormal ekg. This EKG was interpreted by Loni Beckwith, MD,ED Provider.      XRAY INTERPRETATION (ED MD)  Chest Xray  Bilateral interstitial infiltrate suspicious for mild edema. No focal consultation or pneumothorax.  Loni Beckwith, MD 3:57 AM       Progress Note:   Pt has been reexamined by Loni Beckwith, MD. Pt is feeling much better. Symptoms have improved. All available results have been reviewed with pt and any available family. The CT scan was negative for PE and CHF or pulmonary edema. The patient remained hemodynamically stable. He is not in any distress and sleeping comfortably. The patient has been asking for narcotic analgesia during his entire visit. 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 Pt understands sx, dx, and tx in ED. Care plan has been outlined and questions have been answered. Pt is ready to go home. Will send home on asthma, pleurisy, chest pain instruction. Tobacco  cessation education was provided . Prescription of albuterol inhaler and Medrol Dosepak. Outpatient referral with PCP as needed. Written by Loni Beckwith, MD,6:28 AM    .   .   Marland Kitchen

## 2015-12-19 NOTE — ED Notes (Addendum)
Pt reports that pain had subsided but shot back to an 8 after xray stopped by. Pt states that he is still nauseas. Pt feels that he is able to breathe better after neb treatment. MD notified of pt's status.

## 2015-12-20 LAB — EKG, 12 LEAD, INITIAL
Atrial Rate: 108 {beats}/min
Calculated P Axis: 73 degrees
Calculated R Axis: -10 degrees
Calculated T Axis: 84 degrees
P-R Interval: 172 ms
Q-T Interval: 380 ms
QRS Duration: 80 ms
QTC Calculation (Bezet): 509 ms
Ventricular Rate: 108 {beats}/min

## 2015-12-20 LAB — PREPARE RBC
Expiration Date: 201709282359
UTYPE: O POS

## 2015-12-20 LAB — EKG 12-LEAD
Atrial Rate: 108 {beats}/min
P Axis: 73 degrees
P-R Interval: 172 ms
Q-T Interval: 380 ms
QRS Duration: 80 ms
QTc Calculation (Bazett): 509 ms
R Axis: -10 degrees
T Axis: 84 degrees
Ventricular Rate: 108 {beats}/min

## 2015-12-30 ENCOUNTER — Other Ambulatory Visit: Payer: Self-pay

## 2015-12-30 ENCOUNTER — Inpatient Hospital Stay: Payer: Medicare Other | Admitting: Internal Medicine

## 2015-12-30 ENCOUNTER — Emergency Department: Payer: Medicare Other

## 2015-12-30 ENCOUNTER — Inpatient Hospital Stay
Admission: EM | Admit: 2015-12-30 | Discharge: 2016-01-05 | DRG: 291 | Disposition: A | Discharge status: Home / Self Care | Payer: Medicare Other | Attending: Internal Medicine | Admitting: Internal Medicine

## 2015-12-30 DIAGNOSIS — D631 Anemia in chronic kidney disease: Secondary | ICD-10-CM | POA: Diagnosis present

## 2015-12-30 DIAGNOSIS — I160 Hypertensive urgency: Secondary | ICD-10-CM | POA: Diagnosis present

## 2015-12-30 DIAGNOSIS — Z87891 Personal history of nicotine dependence: Secondary | ICD-10-CM

## 2015-12-30 DIAGNOSIS — I509 Heart failure, unspecified: Secondary | ICD-10-CM | POA: Diagnosis present

## 2015-12-30 DIAGNOSIS — R0602 Shortness of breath: Secondary | ICD-10-CM

## 2015-12-30 DIAGNOSIS — I252 Old myocardial infarction: Secondary | ICD-10-CM

## 2015-12-30 DIAGNOSIS — I132 Hypertensive heart and chronic kidney disease with heart failure and with stage 5 chronic kidney disease, or end stage renal disease: Principal | ICD-10-CM | POA: Diagnosis present

## 2015-12-30 DIAGNOSIS — R0789 Other chest pain: Secondary | ICD-10-CM

## 2015-12-30 DIAGNOSIS — I2510 Atherosclerotic heart disease of native coronary artery without angina pectoris: Secondary | ICD-10-CM | POA: Diagnosis present

## 2015-12-30 DIAGNOSIS — I429 Cardiomyopathy, unspecified: Secondary | ICD-10-CM | POA: Diagnosis present

## 2015-12-30 DIAGNOSIS — I5023 Acute on chronic systolic (congestive) heart failure: Secondary | ICD-10-CM | POA: Diagnosis present

## 2015-12-30 DIAGNOSIS — K219 Gastro-esophageal reflux disease without esophagitis: Secondary | ICD-10-CM | POA: Diagnosis present

## 2015-12-30 DIAGNOSIS — N186 End stage renal disease: Secondary | ICD-10-CM | POA: Diagnosis present

## 2015-12-30 DIAGNOSIS — Z992 Dependence on renal dialysis: Secondary | ICD-10-CM

## 2015-12-30 DIAGNOSIS — I471 Supraventricular tachycardia: Secondary | ICD-10-CM | POA: Diagnosis not present

## 2015-12-30 DIAGNOSIS — Z9115 Patient's noncompliance with renal dialysis: Secondary | ICD-10-CM

## 2015-12-30 DIAGNOSIS — J45909 Unspecified asthma, uncomplicated: Secondary | ICD-10-CM | POA: Diagnosis present

## 2015-12-30 DIAGNOSIS — G4733 Obstructive sleep apnea (adult) (pediatric): Secondary | ICD-10-CM | POA: Diagnosis present

## 2015-12-30 DIAGNOSIS — R079 Chest pain, unspecified: Secondary | ICD-10-CM | POA: Diagnosis present

## 2015-12-30 LAB — COMPREHENSIVE METABOLIC PANEL
ALT: 21 U/L (ref 0–55)
AST (SGOT): 17 U/L (ref 5–34)
Albumin/Globulin Ratio: 0.8 — ABNORMAL LOW (ref 0.9–2.2)
Albumin: 2.6 g/dL — ABNORMAL LOW (ref 3.5–5.0)
Alkaline Phosphatase: 225 U/L — ABNORMAL HIGH (ref 38–106)
Anion Gap: 16 — ABNORMAL HIGH (ref 5.0–15.0)
BUN: 69 mg/dL — ABNORMAL HIGH (ref 9–28)
Bilirubin, Total: 0.4 mg/dL (ref 0.2–1.2)
CO2: 21 mEq/L — ABNORMAL LOW (ref 22–29)
Calcium: 5.5 mg/dL — CL (ref 8.5–10.5)
Chloride: 104 mEq/L (ref 100–111)
Creatinine: 7.4 mg/dL — ABNORMAL HIGH (ref 0.7–1.3)
Globulin: 3.1 g/dL (ref 2.0–3.6)
Glucose: 97 mg/dL (ref 70–100)
Potassium: 4.9 mEq/L (ref 3.5–5.1)
Protein, Total: 5.7 g/dL — ABNORMAL LOW (ref 6.0–8.3)
Sodium: 141 mEq/L (ref 136–145)

## 2015-12-30 LAB — CBC AND DIFFERENTIAL
Absolute NRBC: 0 10*3/uL
Basophils Absolute Automated: 0.01 10*3/uL (ref 0.00–0.20)
Basophils Automated: 0.1 %
Eosinophils Absolute Automated: 0.55 10*3/uL (ref 0.00–0.70)
Eosinophils Automated: 5.6 %
Hematocrit: 24 % — ABNORMAL LOW (ref 42.0–52.0)
Hgb: 7.6 g/dL — ABNORMAL LOW (ref 13.0–17.0)
Immature Granulocytes Absolute: 0.14 10*3/uL — ABNORMAL HIGH
Immature Granulocytes: 1.4 %
Lymphocytes Absolute Automated: 0.42 10*3/uL — ABNORMAL LOW (ref 0.50–4.40)
Lymphocytes Automated: 4.3 %
MCH: 29.5 pg (ref 28.0–32.0)
MCHC: 31.7 g/dL — ABNORMAL LOW (ref 32.0–36.0)
MCV: 93 fL (ref 80.0–100.0)
MPV: 10.5 fL (ref 9.4–12.3)
Monocytes Absolute Automated: 0.67 10*3/uL (ref 0.00–1.20)
Monocytes: 6.9 %
Neutrophils Absolute: 7.95 10*3/uL (ref 1.80–8.10)
Neutrophils: 81.7 %
Nucleated RBC: 0 /100 WBC (ref 0.0–1.0)
Platelets: 113 10*3/uL — ABNORMAL LOW (ref 140–400)
RBC: 2.58 10*6/uL — ABNORMAL LOW (ref 4.70–6.00)
RDW: 16 % — ABNORMAL HIGH (ref 12–15)
WBC: 9.74 10*3/uL (ref 3.50–10.80)

## 2015-12-30 LAB — PHOSPHORUS: Phosphorus: 6.1 mg/dL — ABNORMAL HIGH (ref 2.3–4.7)

## 2015-12-30 LAB — MAGNESIUM: Magnesium: 1.8 mg/dL (ref 1.6–2.6)

## 2015-12-30 LAB — GFR: EGFR: 9.7

## 2015-12-30 LAB — TROPONIN I: Troponin I: 0.08 ng/mL (ref 0.00–0.09)

## 2015-12-30 LAB — B-TYPE NATRIURETIC PEPTIDE: B-Natriuretic Peptide: 2582 pg/mL — ABNORMAL HIGH (ref 0–100)

## 2015-12-30 MED ORDER — FUROSEMIDE 10 MG/ML IJ SOLN
40.0000 mg | Freq: Two times a day (BID) | INTRAMUSCULAR | Status: DC
Start: 2015-12-31 — End: 2016-01-05
  Administered 2015-12-31 – 2016-01-05 (×9): 40 mg via INTRAVENOUS
  Filled 2015-12-30 (×10): qty 4

## 2015-12-30 MED ORDER — FENTANYL CITRATE (PF) 50 MCG/ML IJ SOLN (WRAP)
INTRAMUSCULAR | Status: AC
Start: 2015-12-30 — End: 2015-12-30
  Administered 2015-12-30: 50 ug via INTRAVENOUS
  Filled 2015-12-30: qty 2

## 2015-12-30 MED ORDER — FENTANYL CITRATE (PF) 50 MCG/ML IJ SOLN (WRAP)
50.0000 ug | Freq: Once | INTRAMUSCULAR | Status: AC
Start: 2015-12-30 — End: 2015-12-30

## 2015-12-30 MED ORDER — ONDANSETRON HCL 4 MG/2ML IJ SOLN
4.0000 mg | Freq: Once | INTRAMUSCULAR | Status: AC
Start: 2015-12-30 — End: 2015-12-30
  Administered 2015-12-30: 4 mg via INTRAVENOUS
  Filled 2015-12-30: qty 2

## 2015-12-30 MED ORDER — ALBUTEROL SULFATE (2.5 MG/3ML) 0.083% IN NEBU
2.5000 mg | INHALATION_SOLUTION | Freq: Four times a day (QID) | RESPIRATORY_TRACT | Status: DC
Start: 2015-12-30 — End: 2015-12-31
  Filled 2015-12-30: qty 3

## 2015-12-30 MED ORDER — ONDANSETRON HCL 4 MG/2ML IJ SOLN
4.0000 mg | INTRAMUSCULAR | Status: DC | PRN
Start: 2015-12-30 — End: 2016-01-05

## 2015-12-30 MED ORDER — HYDRALAZINE HCL 25 MG PO TABS
100.0000 mg | ORAL_TABLET | Freq: Three times a day (TID) | ORAL | Status: DC
Start: 2015-12-30 — End: 2016-01-05
  Administered 2015-12-31 – 2016-01-05 (×14): 100 mg via ORAL
  Filled 2015-12-30 (×15): qty 4

## 2015-12-30 MED ORDER — SEVELAMER CARBONATE 800 MG PO TABS
800.0000 mg | ORAL_TABLET | Freq: Three times a day (TID) | ORAL | Status: DC
Start: 2015-12-31 — End: 2016-01-05
  Administered 2015-12-31 – 2016-01-05 (×15): 800 mg via ORAL
  Filled 2015-12-30 (×16): qty 1

## 2015-12-30 MED ORDER — ALBUTEROL-IPRATROPIUM 2.5-0.5 (3) MG/3ML IN SOLN
3.0000 mL | Freq: Four times a day (QID) | RESPIRATORY_TRACT | Status: DC
Start: 2015-12-30 — End: 2015-12-31
  Administered 2015-12-31 (×2): 3 mL via RESPIRATORY_TRACT
  Filled 2015-12-30 (×3): qty 3

## 2015-12-30 MED ORDER — NITROGLYCERIN 0.4 MG SL SUBL
0.4000 mg | SUBLINGUAL_TABLET | SUBLINGUAL | Status: DC | PRN
Start: 2015-12-30 — End: 2016-01-05
  Administered 2015-12-30 (×2): 0.4 mg via SUBLINGUAL
  Filled 2015-12-30: qty 1

## 2015-12-30 MED ORDER — CARVEDILOL 12.5 MG PO TABS
25.0000 mg | ORAL_TABLET | Freq: Two times a day (BID) | ORAL | Status: DC
Start: 2015-12-30 — End: 2016-01-05
  Administered 2015-12-30 – 2016-01-05 (×11): 25 mg via ORAL
  Filled 2015-12-30 (×12): qty 2

## 2015-12-30 MED ORDER — PANTOPRAZOLE SODIUM 40 MG PO TBEC
40.0000 mg | DELAYED_RELEASE_TABLET | Freq: Every day | ORAL | Status: DC
Start: 2015-12-30 — End: 2016-01-05
  Administered 2015-12-30 – 2016-01-05 (×8): 40 mg via ORAL
  Filled 2015-12-30 (×7): qty 1

## 2015-12-30 MED ORDER — SODIUM CHLORIDE 0.9 % IV SOLN
1.0000 g | Freq: Once | INTRAVENOUS | Status: AC
Start: 2015-12-30 — End: 2015-12-30
  Administered 2015-12-30: 1 g via INTRAVENOUS
  Filled 2015-12-30: qty 10

## 2015-12-30 MED ORDER — FENTANYL CITRATE (PF) 50 MCG/ML IJ SOLN (WRAP)
100.0000 ug | Freq: Once | INTRAMUSCULAR | Status: AC
Start: 2015-12-30 — End: 2015-12-30
  Administered 2015-12-30: 100 ug via INTRAVENOUS
  Filled 2015-12-30: qty 2

## 2015-12-30 MED ORDER — FENTANYL CITRATE (PF) 50 MCG/ML IJ SOLN (WRAP)
50.0000 ug | Freq: Once | INTRAMUSCULAR | Status: AC
Start: 2015-12-30 — End: 2015-12-30
  Administered 2015-12-30: 50 ug via INTRAVENOUS
  Filled 2015-12-30: qty 2

## 2015-12-30 MED ORDER — HYDROMORPHONE HCL 1 MG/ML IJ SOLN
1.0000 mg | INTRAMUSCULAR | Status: DC | PRN
Start: 2015-12-30 — End: 2016-01-05
  Administered 2015-12-30 – 2016-01-05 (×11): 1 mg via INTRAVENOUS
  Filled 2015-12-30 (×13): qty 1

## 2015-12-30 NOTE — Consults (Addendum)
Cardiology Consult note  Cardiology Consult    Date/Time:  12/30/2015 4:14 PM  Patient Name:  [name]  DoB:  1971-04-03  MRN:  [id number]  Room#: E 17/E17    Chief Complaint:    Chief Complaint   Patient presents with   . Chest Pain       Reason For Consult:   Chest pain per Dr. Ermelinda Das    HPI :   45 yo with chest pain. Began at rest last PM at 930. Constant for hours. Not pleuritic or positional. Associated L arm pain, back pain and L  Arm swelling. Not exertional. No help with NTG. Orthopnea. No syncope or palpitations. Has not been able to keep meds down due to N/V. Dialysis yesterday.      PMHx:     Past Medical History:   Diagnosis Date   . Asthma without status asthmaticus    . 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 Clostridium difficile diarrhea    . Drug-seeking behavior    . Hypertension    . Kidney failure        Allergies:     Allergies   Allergen Reactions   . Lisinopril Anaphylaxis   . Morphine Anaphylaxis   . Toradol [Ketorolac Tromethamine]        Home Medications:     No current facility-administered medications on file prior to encounter.      Current Outpatient Prescriptions on File Prior to Encounter   Medication Sig Dispense Refill   . albuterol (PROVENTIL) (2.5 MG/3ML) 0.083% nebulizer solution Take 2.5 mg by nebulization 2 (two) times daily as needed.         Marland Kitchen albuterol-ipratropium (COMBIVENT RESPIMAT) 20-100 MCG/ACT Aero Soln Inhale 2 puffs into the lungs daily.         Marland Kitchen amLODIPine (NORVASC) 10 MG tablet Take 10 mg by mouth daily.         . carvedilol (COREG) 25 MG tablet Take 25 mg by mouth 2 (two) times daily.     . furosemide (LASIX) 80 MG tablet Take 80 mg by mouth daily.     . hydrALAZINE (APRESOLINE) 100 MG tablet Take 100 mg by mouth 3 (three) times daily.     . pantoprazole (PROTONIX) 40 MG tablet Take 40 mg by mouth daily.     . sevelamer (RENVELA) 800 MG tablet Take 800 mg by mouth 3 (three) times daily with meals.           SoHx:     Social History   Substance Use Topics   .  Smoking status: Former Smoker     Packs/day: 0.50     Types: Cigarettes     Quit date: 09/18/2015   . Smokeless tobacco: Former Neurosurgeon   . Alcohol use No       Surgical Hx:     Past Surgical History:   Procedure Laterality Date   . AV FISTULA PLACEMENT     . PERITONEAL CATHETER INSERTION         Family Hx:     Family History   Problem Relation Age of Onset   . Hypertension Mother        Review of Systems:   Constitutional: fevers and chills  Skin: No rash or lesions  Respiratory: Negative for hemoptysis  Cardiovascular: as per HPI  Gastrointestinal: Nausea/emesis  Musculoskeletal:  No falls  Genitourinary: Negative for hematuria  Neurologic: No headaches  Otherwise 10 point review of systems  is negative.      Physical Exam:   BP 150/78   Pulse (!) 105   Temp 97.9 F (36.6 C) (Oral)   Resp (!) 24   Ht 1.854 m (6\' 1" )   Wt 106.6 kg (235 lb)   SpO2 98%   BMI 31.00 kg/m   98% SpO2 on              Constitutional:      Alert, cooperative, well developed, well nourished.  No acute distress.   Skin:     Warm and dry to touch.  L arm fistula. + tatoos   Head/Eyes :    Normocephalic.  EOM intact.  Normal conjunctivae and lids.       Neck:   Carotid pulses full and equal bilaterally.  No carotid bruit. JVP to 5 cm.   Lungs:     No tenderness to palpation.  Normal respiratory effort.  Clear to auscultation bilaterally.  No rhonchi, rales, or wheezes.     Cardiac:   LV apical impulse not displaced.  Regular rhythm.   S1 and   S2 normal.  No rub, or gallop   Abdomen:     Soft, non-tender.  Bowel sounds present.  No bruits.   Extremities:   L arm edema; NP BLE edema   Pulses:   Distal radial pulses are full and equal bilaterally.     Neurologic:   No gross motor or sensory deficits.  Alert O x 3.   Psychiatric:   Normal mood and affect.     Labs:     Admission on 12/30/2015   Component Date Value Ref Range Status   . Ventricular Rate 12/30/2015 102  BPM Preliminary   . Atrial Rate 12/30/2015 102  BPM Preliminary   . P-R  Interval 12/30/2015 164  ms Preliminary   . QRS Duration 12/30/2015 72  ms Preliminary   . Q-T Interval 12/30/2015 380  ms Preliminary   . QTC Calculation (Bezet) 12/30/2015 495  ms Preliminary   . P Axis 12/30/2015 51  degrees Preliminary   . R Axis 12/30/2015 -6  degrees Preliminary   . T Axis 12/30/2015 81  degrees Preliminary   . WBC 12/30/2015 9.74  3.50 - 10.80 x10 3/uL Final   . Hgb 12/30/2015 7.6* 13.0 - 17.0 g/dL Final   . Hematocrit 16/01/9603 24.0* 42.0 - 52.0 % Final   . Platelets 12/30/2015 113* 140 - 400 x10 3/uL Final   . RBC 12/30/2015 2.58* 4.70 - 6.00 x10 6/uL Final   . MCV 12/30/2015 93.0  80.0 - 100.0 fL Final   . MCH 12/30/2015 29.5  28.0 - 32.0 pg Final   . MCHC 12/30/2015 31.7* 32.0 - 36.0 g/dL Final   . RDW 54/12/8117 16* 12 - 15 % Final   . MPV 12/30/2015 10.5  9.4 - 12.3 fL Final   . Neutrophils 12/30/2015 81.7  None % Final   . Lymphocytes Automated 12/30/2015 4.3  None % Final   . Monocytes 12/30/2015 6.9  None % Final   . Eosinophils Automated 12/30/2015 5.6  None % Final   . Basophils Automated 12/30/2015 0.1  None % Final   . Immature Granulocyte 12/30/2015 1.4  None % Final   . Nucleated RBC 12/30/2015 0.0  0.0 - 1.0 /100 WBC Final   . Neutrophils Absolute 12/30/2015 7.95  1.80 - 8.10 x10 3/uL Final   . Abs Lymph Automated 12/30/2015 0.42* 0.50 - 4.40 x10 3/uL  Final   . Abs Mono Automated 12/30/2015 0.67  0.00 - 1.20 x10 3/uL Final   . Abs Eos Automated 12/30/2015 0.55  0.00 - 0.70 x10 3/uL Final   . Absolute Baso Automated 12/30/2015 0.01  0.00 - 0.20 x10 3/uL Final   . Absolute Immature Granulocyte 12/30/2015 0.14* 0 x10 3/uL Final   . Absolute NRBC 12/30/2015 0.00  0 x10 3/uL Final   . Glucose 12/30/2015 97  70 - 100 mg/dL Corrected   . BUN 12/30/2015 69* 9 - 28 mg/dL Final   . Creatinine 16/01/9603 7.4* 0.7 - 1.3 mg/dL Final   . Sodium 54/12/8117 141  136 - 145 mEq/L Final   . Potassium 12/30/2015 4.9  3.5 - 5.1 mEq/L Final   . Chloride 12/30/2015 104  100 - 111 mEq/L Final   .  CO2 12/30/2015 21* 22 - 29 mEq/L Final   . Calcium 12/30/2015 5.5* 8.5 - 10.5 mg/dL Corrected   . Protein, Total 12/30/2015 5.7* 6.0 - 8.3 g/dL Final   . Albumin 14/78/2956 2.6* 3.5 - 5.0 g/dL Final   . AST (SGOT) 21/30/8657 17  5 - 34 U/L Final   . ALT 12/30/2015 21  0 - 55 U/L Final   . Alkaline Phosphatase 12/30/2015 225* 38 - 106 U/L Final   . Bilirubin, Total 12/30/2015 0.4  0.2 - 1.2 mg/dL Final   . Globulin 84/69/6295 3.1  2.0 - 3.6 g/dL Final   . Albumin/Globulin Ratio 12/30/2015 0.8* 0.9 - 2.2 Final   . Anion Gap 12/30/2015 16.0* 5.0 - 15.0 Final   . B-Natriuretic Peptide 12/30/2015 2582* 0 - 100 pg/mL Final   . Magnesium 12/30/2015 1.8  1.6 - 2.6 mg/dL Final   . Phosphorus 28/41/3244 6.1* 2.3 - 4.7 mg/dL Final   . Troponin I 04/21/7251 0.08  0.00 - 0.09 ng/mL Final   . EGFR 12/30/2015 9.7   Final       Radiology:     ECG independently reviewed by me shows NSR with NS St changes    Assessment/Plan:     [name] is a 45 y.o. male with h/o reported CAD, EF 35-40% and ESRD presents with chest pain associated with L arm swelling, N/V and fevers. No evidence of ACS by ECG or enzymes. Pain is atypical in that it has gone on for 18 hours and is not exertional and not helped by NTG. Follow cardiac enzymes and monitor on telemetry. LUE doppler ordered.    Thank you for the consultation. Please feel free to contact me with any questions that may arise regarding this visit.

## 2015-12-30 NOTE — Progress Notes (Signed)
Offered patient nitroglycerin tablet for chest pain patient refused.

## 2015-12-30 NOTE — ED Provider Notes (Signed)
EMERGENCY DEPARTMENT NOTE    Physician/Midlevel provider first contact with patient: 12/30/15 1429         HISTORY OF PRESENT ILLNESS   Historian:Patient  Translator Used: No    Chief Complaint: Chest Pain     Mechanism of Injury:       45 y.o. male presents to the ED complaining of sudden onset chest pain.  Pain started last night while he was watching TV.  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  States that it feels like the MI he had in 2010.  + SOB.  Patient with h/o ESRD on dialysis.    1. Location of symptoms: substernal/L chest  2. Onset of symptoms: last night  3. What was patient doing when symptoms started (Context): see above  4. Severity: moderate  5. Timing: constant  6. Activities that worsen symptoms: nothing  7. Activities that improve symptoms: nothing  8. Quality: burning  9. Radiation of symptoms: no  10. Associated signs and Symptoms: see above  11. Are symptoms worsening? yes  MEDICAL HISTORY     Past Medical History:  Past Medical History:   Diagnosis Date   . Asthma without status asthmaticus    . 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 Clostridium difficile diarrhea    . Drug-seeking behavior    . Hypertension    . Kidney failure        Past Surgical History:  Past Surgical History:   Procedure Laterality Date   . AV FISTULA PLACEMENT     . PERITONEAL CATHETER INSERTION         Social History:  Social History     Social History   . Marital status: Single     Spouse name: N/A   . Number of children: N/A   . Years of education: N/A     Occupational History   . Not on file.     Social History Main Topics   . Smoking status: Former Smoker     Packs/day: 0.50     Types: Cigarettes     Quit date: 09/18/2015   . Smokeless tobacco: Former Neurosurgeon   . Alcohol use No   . Drug use: No   . Sexual activity: Not on file     Other Topics Concern   . Not on file     Social History Narrative   . No narrative on file       Family History:  Family History   Problem Relation Age of Onset    . Hypertension Mother        Outpatient Medication:  Previous Medications    ALBUTEROL (PROVENTIL) (2.5 MG/3ML) 0.083% NEBULIZER SOLUTION    Take 2.5 mg by nebulization 2 (two) times daily as needed.        ALBUTEROL-IPRATROPIUM (COMBIVENT RESPIMAT) 20-100 MCG/ACT AERO SOLN    Inhale 2 puffs into the lungs daily.        AMLODIPINE (NORVASC) 10 MG TABLET    Take 10 mg by mouth daily.        CARVEDILOL (COREG) 25 MG TABLET    Take 25 mg by mouth 2 (two) times daily.    FUROSEMIDE (LASIX) 80 MG TABLET    Take 80 mg by mouth daily.    HYDRALAZINE (APRESOLINE) 100 MG TABLET    Take 100 mg by mouth 3 (three) times daily.    PANTOPRAZOLE (PROTONIX) 40 MG TABLET    Take  40 mg by mouth daily.    SEVELAMER (RENVELA) 800 MG TABLET    Take 800 mg by mouth 3 (three) times daily with meals.         REVIEW OF SYSTEMS   Review of Systems   Constitutional: Negative.  Negative for chills and fever.   Respiratory: Positive for shortness of breath.    Cardiovascular: Positive for chest pain.   Gastrointestinal: Negative.  Negative for abdominal pain, nausea and vomiting.   Neurological: Negative.  Negative for dizziness and light-headedness.   All other systems reviewed and are negative.      PHYSICAL EXAM     ED Triage Vitals [12/30/15 1422]   Enc Vitals Group      BP 150/78      Heart Rate (!) 105      Resp Rate (!) 24      Temp 97.9 F (36.6 C)      Temp Source Oral      SpO2 98 %      Weight 106.6 kg      Height 1.854 m      Head Circumference       Peak Flow       Pain Score 10      Pain Loc       Pain Edu?       Excl. in GC?      Physical Exam   Constitutional: He appears well-developed and well-nourished. No distress.   HENT:   Head: Normocephalic and atraumatic.   Mouth/Throat: Oropharynx is clear and moist.   Eyes: Conjunctivae are normal. Pupils are equal, round, and reactive to light.   Neck: Normal range of motion. Neck supple.   Cardiovascular: Normal rate, regular rhythm and normal heart sounds.    Pulmonary/Chest:  Effort normal and breath sounds normal. He exhibits no tenderness.   Abdominal: Soft. There is no tenderness. There is no guarding. No hernia.   Musculoskeletal:   Fistula to LUE   Skin: Skin is warm and dry.   Nursing note and vitals reviewed.      MEDICAL DECISION MAKING     DISCUSSION    Patient with ESRD, CAD and presents with chest pain.  CXR negative for PNA, pulmonary edema.  No improvement with nitro.  Unable to achieve pain free in ED.  BNP elevated.  Will admit given cardiac history for further cardiac work-up.  Discussed with Dr. Willaim Bane.  Discussed with Dr. Higinio Plan.  Consulted nephrology.      Vital Signs: Reviewed the patient?s vital signs.   Nursing Notes: Reviewed and utilized available nursing notes.  Medical Records Reviewed: Reviewed available past medical records.  Counseling: The emergency provider has spoken with the patient and discussed today?s findings, in addition to providing specific details for the plan of care.  Questions are answered and there is agreement with the plan.      CARDIAC STUDIES    The following cardiac studies were independently interpreted by the Emergency Medicine Physician.  For full cardiac study results please see chart.    Monitor Strip  Interpreted by ED Physician  Rate: 100  Rhythm: NSR   ST Changes: none    EKG Interpretation:  Signed and interpreted byED Physician   Time Interpreted: 1430  Rate: 102  Rhythm: Sinus tachycardia  Axis: normal  Intervals: normal  Blocks: none  ST segments: normal  Interpretation: Nonspecific  EKG    EMERGENCY IMAGING STUDIES    The following imagine studies  were independently interpreted by me (emergency physician):    Radiology:  Interpreted by me (ED Physician)  Study: Chest Xray   Results: No infiltrate. No pneumothorax. No hemothorax. No cardiomegaly. No CHF.  Impression: No acute intrathoracic abnormality.      RADIOLOGY IMAGING STUDIES      XR Chest  AP Portable   Final Result    No active disease      Kinnie Feil, MD     12/30/2015 2:50 PM                 PULSE OXIMETRY    Oxygen Saturation by Pulse Oximetry: 98%  Interventions: none  Interpretation:  normal    EMERGENCY DEPT. MEDICATIONS      ED Medication Orders     Start Ordered     Status Ordering Provider    12/30/15 1536 12/30/15 1535  ondansetron (ZOFRAN) injection 4 mg  Once     Route: Intravenous  Ordered Dose: 4 mg     Last MAR action:  Given Larina Bras    12/30/15 1511 12/30/15 1510  fentaNYL (PF) (SUBLIMAZE) injection 100 mcg  Once     Route: Intravenous  Ordered Dose: 100 mcg     Last MAR action:  Given Larina Bras    12/30/15 1436 12/30/15 1436  nitroglycerin (NITROSTAT) SL tablet 0.4 mg  Every 5 min PRN     Route: Sublingual  Ordered Dose: 0.4 mg     Last MAR action:  Given Amiya Escamilla KAEHLER          LABORATORY RESULTS    Ordered and independently interpreted AVAILABLE laboratory tests. Please see results section in chart for full details.  Results for orders placed or performed during the hospital encounter of 12/30/15   CBC with differential   Result Value Ref Range    WBC 9.74 3.50 - 10.80 x10 3/uL    Hgb 7.6 (L) 13.0 - 17.0 g/dL    Hematocrit 16.1 (L) 42.0 - 52.0 %    Platelets 113 (L) 140 - 400 x10 3/uL    RBC 2.58 (L) 4.70 - 6.00 x10 6/uL    MCV 93.0 80.0 - 100.0 fL    MCH 29.5 28.0 - 32.0 pg    MCHC 31.7 (L) 32.0 - 36.0 g/dL    RDW 16 (H) 12 - 15 %    MPV 10.5 9.4 - 12.3 fL    Neutrophils 81.7 None %    Lymphocytes Automated 4.3 None %    Monocytes 6.9 None %    Eosinophils Automated 5.6 None %    Basophils Automated 0.1 None %    Immature Granulocyte 1.4 None %    Nucleated RBC 0.0 0.0 - 1.0 /100 WBC    Neutrophils Absolute 7.95 1.80 - 8.10 x10 3/uL    Abs Lymph Automated 0.42 (L) 0.50 - 4.40 x10 3/uL    Abs Mono Automated 0.67 0.00 - 1.20 x10 3/uL    Abs Eos Automated 0.55 0.00 - 0.70 x10 3/uL    Absolute Baso Automated 0.01 0.00 - 0.20 x10 3/uL    Absolute Immature Granulocyte 0.14 (H) 0 x10 3/uL    Absolute NRBC 0.00 0 x10  3/uL   Comprehensive metabolic panel   Result Value Ref Range    Glucose 97 70 - 100 mg/dL    BUN 69 (H) 9 - 28 mg/dL    Creatinine 7.4 (H) 0.7 - 1.3 mg/dL    Sodium 096  136 - 145 mEq/L    Potassium 4.9 3.5 - 5.1 mEq/L    Chloride 104 100 - 111 mEq/L    CO2 21 (L) 22 - 29 mEq/L    Calcium 5.5 (LL) 8.5 - 10.5 mg/dL    Protein, Total 5.7 (L) 6.0 - 8.3 g/dL    Albumin 2.6 (L) 3.5 - 5.0 g/dL    AST (SGOT) 17 5 - 34 U/L    ALT 21 0 - 55 U/L    Alkaline Phosphatase 225 (H) 38 - 106 U/L    Bilirubin, Total 0.4 0.2 - 1.2 mg/dL    Globulin 3.1 2.0 - 3.6 g/dL    Albumin/Globulin Ratio 0.8 (L) 0.9 - 2.2    Anion Gap 16.0 (H) 5.0 - 15.0   B-type Natriuretic Peptide   Result Value Ref Range    B-Natriuretic Peptide 2,582 (H) 0 - 100 pg/mL   Magnesium   Result Value Ref Range    Magnesium 1.8 1.6 - 2.6 mg/dL   Phosphorus   Result Value Ref Range    Phosphorus 6.1 (H) 2.3 - 4.7 mg/dL   Troponin I   Result Value Ref Range    Troponin I 0.08 0.00 - 0.09 ng/mL   GFR   Result Value Ref Range    EGFR 9.7    ECG 12 lead   Result Value Ref Range    Ventricular Rate 102 BPM    Atrial Rate 102 BPM    P-R Interval 164 ms    QRS Duration 72 ms    Q-T Interval 380 ms    QTC Calculation (Bezet) 495 ms    P Axis 51 degrees    R Axis -6 degrees    T Axis 81 degrees     DIAGNOSIS      Diagnosis:  Final diagnoses:   Chest pain in adult   ESRD on dialysis   SOB (shortness of breath)       Disposition:  ED Disposition     ED Disposition Condition Date/Time Comment    Observation  Tue Dec 30, 2015  4:04 PM Admitting Physician: Mosie Epstein [[id number]]   Diagnosis: Chest pain in adult [5409811]   Estimated Length of Stay: < 2 midnights   Tentative Discharge Plan?: Home or Self Care [1]   Patient Class: Observation [104]            Prescriptions:  Patient's Medications   New Prescriptions    No medications on file   Previous Medications    ALBUTEROL (PROVENTIL) (2.5 MG/3ML) 0.083% NEBULIZER SOLUTION    Take 2.5 mg by nebulization 2 (two) times daily as  needed.        ALBUTEROL-IPRATROPIUM (COMBIVENT RESPIMAT) 20-100 MCG/ACT AERO SOLN    Inhale 2 puffs into the lungs daily.        AMLODIPINE (NORVASC) 10 MG TABLET    Take 10 mg by mouth daily.        CARVEDILOL (COREG) 25 MG TABLET    Take 25 mg by mouth 2 (two) times daily.    FUROSEMIDE (LASIX) 80 MG TABLET    Take 80 mg by mouth daily.    HYDRALAZINE (APRESOLINE) 100 MG TABLET    Take 100 mg by mouth 3 (three) times daily.    PANTOPRAZOLE (PROTONIX) 40 MG TABLET    Take 40 mg by mouth daily.    SEVELAMER (RENVELA) 800 MG TABLET    Take 800 mg by mouth 3 (three) times daily  with meals.   Modified Medications    No medications on file   Discontinued Medications    No medications on file            Larina Bras, MD  12/30/15 [phone]
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
## 2015-12-31 LAB — TROPONIN I: Troponin I: 0.07 ng/mL (ref 0.00–0.09)

## 2015-12-31 MED ORDER — SODIUM CHLORIDE 0.9 % IV BOLUS
100.0000 mL | INTRAVENOUS | Status: AC | PRN
Start: 2015-12-31 — End: 2015-12-31

## 2015-12-31 MED ORDER — HYDROMORPHONE HCL 1 MG/ML IJ SOLN
2.0000 mg | INTRAMUSCULAR | Status: DC | PRN
Start: 2015-12-31 — End: 2016-01-05
  Administered 2015-12-31 – 2016-01-05 (×26): 2 mg via INTRAVENOUS
  Filled 2015-12-31 (×25): qty 2

## 2015-12-31 MED ORDER — LIDOCAINE 5 % EX PTCH
1.0000 | MEDICATED_PATCH | CUTANEOUS | Status: DC
Start: 2015-12-31 — End: 2016-01-05
  Administered 2015-12-31 – 2016-01-05 (×6): 1 via TRANSDERMAL
  Filled 2015-12-31 (×6): qty 1

## 2015-12-31 MED ORDER — ISOSORBIDE DINITRATE 10 MG PO TABS
10.0000 mg | ORAL_TABLET | Freq: Three times a day (TID) | ORAL | Status: DC
Start: 2015-12-31 — End: 2016-01-05
  Administered 2015-12-31 – 2016-01-05 (×14): 10 mg via ORAL
  Filled 2015-12-31 (×14): qty 1

## 2015-12-31 MED ORDER — SODIUM CHLORIDE 0.9 % IV BOLUS
250.0000 mL | INTRAVENOUS | Status: AC | PRN
Start: 2015-12-31 — End: 2015-12-31

## 2015-12-31 MED ORDER — ALBUTEROL-IPRATROPIUM 2.5-0.5 (3) MG/3ML IN SOLN
3.0000 mL | Freq: Four times a day (QID) | RESPIRATORY_TRACT | Status: DC | PRN
Start: 2015-12-31 — End: 2016-01-04
  Administered 2016-01-01 – 2016-01-03 (×3): 3 mL via RESPIRATORY_TRACT
  Filled 2015-12-31 (×3): qty 3

## 2015-12-31 MED ORDER — DARBEPOETIN ALFA 100 MCG/0.5ML IJ SOSY
100.0000 ug | PREFILLED_SYRINGE | INTRAMUSCULAR | Status: DC
Start: 2015-12-31 — End: 2016-01-05
  Filled 2015-12-31: qty 0.5

## 2015-12-31 NOTE — Progress Notes (Signed)
BP currently high and primary nurse aware.  Will closely monitor. Patient asymptomatic and currently removing 1.3L per hour.

## 2015-12-31 NOTE — Progress Notes (Signed)
Patient complained of pain 10-10 on the left chest. Phone call to Dr Higinio Plan. Who increased the Dilaudid to   2 Mg every 4 hrs. Orders placed.

## 2015-12-31 NOTE — Progress Notes (Deleted)
Patient alert and oriented x 4.  Time out done. Consent was signed/completed earlier. Patient was recently discharged from [facility].  Patient was admitted for chest pain, and patient said he still had the symptom.  Primary nurse notified and came over  to give patient  Analgesic.  Had crackles but otherwise not in distress. On oxygen nasal cannula at 2-3L.  No other complaint given. Had left chest permacath, with both lumens working.  Dressing was changed 12/29/15, clean, dry, intact. Will attempt to remove net fluid as ordered. Will closely monitor.          12/31/15 1104   Bedside Nurse Communication   Name of bedside RN - pre dialysis Lilet RN   Treatment Initiation- With Dialysis Precautions   Time Out/Safety Check Completed Yes   Consent for HD signed for this hospitalization Y   Blood Consent Verified N/A   Dialysis Precautions All Connections Secured;Saline Line Double Clamped;Venous Parameters Set;Arterial Parameters Set;Air Foam Detecctor Engaged   Dialysis Treatment Type 1st Time Acute   Is patient diabetic? No   RO/Hemodialysis Cabin crew   Is Total Chlorine less than 0.1 ppm? Yes   Orignial Total Chlorine Testing Time 1045   At 4 Hour Total Chlorine Testing Time 1445   RO/Hemodialysis Dance movement psychotherapist Number 2   RO # [id number]   Water Hardness 0   pH 7.4   Pressure Test Verified Yes   Alarms Verified Passed   Machine Temperature 96.3 F (35.7 C)   Alarms Verified Yes   Na+ mEq (Machine) 138 mEq   Bicarb mEq (Machine) 35 mEq   Hemodialysis Conductivity (Machine) 14.2   Hemodialysis Conductivity (Meter) 14   Dialyzer Lot Number ([id number] Exp 08/17/2018)   Tubing Lot Number ([id number] Exp 07/18/2018)   RO Machine Log Completed Yes   Hepatitis Status   HBsAg (Antigen) Result Negative   HBsAg Date Drawn 12/17/15   HBsAg Repeat Draw Due Date 12/16/16   HBsAb (Antibody) Result Reactive   Vitals   Temp 97.3 F (36.3 C)   Heart Rate 92   Resp Rate 18   BP 147/82   Assessment   Mental  Status Alert;Oriented;Cooperative   Cardiac (WDL) WDL   Cardiac Regularity Regular   Respiratory Pattern Regular   Bilateral Breath Sounds Crackles   R Breath Sounds Crackles   L Breath Sounds Crackles   General Skin Color Appropriate for ethnicity   Skin Condition/Temp Warm;Dry   Gastrointestinal (WDL) WDL   GI Symptoms None   Mobility Bed   Permacath/Temporary Catheter Permacath Left   No Placement Date or Time found.   Present on Admission?: Yes  Access Type: Permacath  Orientation: Left  Securement Method: Sutured;Transparent Dressing  Antimicrobial disc applied correctly: No  If antimicrobial disc not used, why?: (c) Other (comme...   Dressing Status and Intervention Dressing Intact;Reinforced Dressing   Pain Assessment   Charting Type Assessment   Pain Scale Used Numeric Scale (0-10)   Numeric Pain Scale   Pain Score 0   POSS Score 1   Pain Location Arm;Chest   Pain Orientation Left   Pain Descriptors Sharp   Pain Frequency Intermittent   Effect of Pain on Daily Activities mild   Patient's Stated Comfort Functional Goal 4   Pain Intervention(s) Rest;Medication   Hemodialysis Comments   Pre-Hemodialysis Comments (Patient identified via wrist band.)

## 2015-12-31 NOTE — Progress Notes (Signed)
12/31/15    Received a call from Hinda Kehr (discharge plan coordinator) with [facility] [phone]. Request plan of care for pt once medically cleared for discharge. Pt on HD MWF

## 2015-12-31 NOTE — Progress Notes (Signed)
12/31/15 9528   Patient Type   Within 30 Days of Previous Admission? Yes   Healthcare Decisions   Interviewed: Patient   Orientation/Decision Making Abilities of Patient Alert and Oriented x3, able to make decisions   Prior to admission   Prior level of function Independent with ADLs   Discharge Planning   Patient expects to be discharged to: HOME   Anticipated [place] plan discussed with: Same as interviewed   Follow up appointment scheduled? Yes   Follow up appointment scheduled with: (NEPHROLOGY)   Consults/Providers   PT Evaluation Needed 1   OT Evalulation Needed 1

## 2015-12-31 NOTE — Plan of Care (Signed)
Problem: Safety  Goal: Patient will be free from injury during hospitalization  Outcome: Progressing  Patient not at risk for elopement. Possessions and call bell within reach.    Problem: Psychosocial and Spiritual Needs  Goal: Demonstrates ability to cope with hospitalization/illness  Outcome: Progressing  Patient verbalizes any needs or concerns.    Problem: Hemodynamic Status: Cardiac  Goal: Stable vital signs and fluid balance  Outcome: Progressing  VSS.

## 2015-12-31 NOTE — UM Notes (Addendum)
12/30/15    [name] 45 y/o male present to the ER c/o chest pain; serum lab significantly abnormal with critical calcium level and elevated BNP>2500. Dx ESRD on dialysis, shortness of breath, chest pain. PMH asthma, HTN, EF 20-40%, kidney failure. See below assessment and care plan.      MD HPI-  ".Marland KitchenMarland KitchenPatient evaluated to be short of breath, fatigue and lethargic. Initial 12 lead EKG negative for Acute myocardial abnormalities.  Troponin 0.08.  Significant bibasilar crackles in both lung fields with highly elevated BNP level of thousand 2582.  Last echocardiogram August 2017 significant for depressed ejection fraction of 30 percent..."    VS: 98.1, 105, 18, 168/94, 98%  LAB: BNP 2582, CA++<6, PHOSPHOROUS >6, ALKALINE PHOS >200, ANION GAP 16,   Sodium 141       Potassium 4.9       Chloride 104       CO2 21       BUN 69       Creatinine 7.4       Hemoglobin 7.6       Hematocrit 24.0       WBC 9.74       Glucose 97          ECG: SINUS TACHYCARDIA    PERTINENT ER MEDS  NITROSTAT 0.4MG  X2  FENTANYL X1 X2  IV ZOFRAN 4MG     Admit to Inpatient (Order [id number])   Admission   Date: 12/30/2015 Department: [facility]     Plan:   Oxygen titration/Bronchodilators    Serial cardiac enzymes , PRN nitroglycerin  Echo August 217 EF of 38%, acute systolic dysfunction    Daily weight   Hyperphosphatemia of ESRD, phosphate binders   Diuresis pending nephrology evaluation for inpatient hemodialysis  GERD on PPI/ Antiemetics  Repeat BMP level/ Monitor electrolytes   Swollen left upper extremity.  We will obtain Doppler studies to rule out DVT  PT/OT evaluation and treatment

## 2015-12-31 NOTE — Plan of Care (Signed)
Problem: Pain  Goal: Pain at adequate level as identified by patient  Outcome: Progressing  Patient is alert and oriented X 4. He complained of pain today and Dilauded as administered as scheduled. Patient had Dialysis today and was able to walk around the unit today. Patient had refused to finish the dialysis today and requested to change the diet to regular, Explained that this actions are not beneficial for him. Medication side effects explained to patient and contra reactions of Dilaudid closely monitored. All questions and concerns addressed as needed.

## 2015-12-31 NOTE — Progress Notes (Signed)
Patient alert and oriented x 4.  Time out done. Consent was signed/completed earlier. Patient was recently discharged from [facility]  Patient was admitted for chest pain, and patient said he still had the symptom.  Primary nurse notified and came over  to give patient  Analgesic.  Had crackles but otherwise not in distress. On oxygen nasal cannula at 2-3L.  No other complaint given. Had left chest permacath, with both lumens working.  Dressing was changed 12/29/15, clean, dry, intact. Will attempt to remove net fluid as ordered. Will closely monitor.          12/31/15 1104   Permacath/Temporary Catheter Permacath Left   No Placement Date or Time found.   Present on Admission?: Yes  Access Type: Permacath  Orientation: Left  Securement Method: Sutured;Transparent Dressing  Antimicrobial disc applied correctly: No  If antimicrobial disc not used, why?: (c) Other (comme...   Line necessity reviewed? Dialysis   Catheter Lumen Volume Venous 1.9 mL   Catheter Lumen Volume Arterial 1.8 mL   Dressing Status and Intervention Dressing Intact;Reinforced Dressing   Tego Caps on Catheter Yes   NEW Tego Caps placed (Date) 12/31/15   End Caps Free From Blood Yes   Line Care Connections checked and tightened   Dressing Type Transparent   Dressing Status Clean;Dry;Intact   Dressing/Line Intervention Dressing reinforced   Biopatch Used? ([facility] Only) Yes   Dressing Change Due 01/05/16   Vitals   Temp 97.3 F (36.3 C)   Heart Rate 92   Resp Rate 18   BP 147/82   Machine Metrics   $Treatment Started/Capturing Charge Yes   Blood Flow Rate (mL/min) 350 mL/min   Arterial Pressure (mmHg) -140 mmHg   Venous Pressure (mmHg) 140   Dialysate Flow Rate (mL/min) 700 mL/min   Transmembrane Pressure (mmHg) 70 mmHg   Ultrafiltration Rate (mL/Hr) 1280 mL/hr   Fluid Removal (ml) 0 ml   Fluid Bolus (ml) 200 ml   Dialysate K (mEq/L) 2 mEq/L   Dialysate CA (mEq/L) 2.5 mEq/L   Hemodialysis Comments   Arteriovenous Lines Secure Yes

## 2015-12-31 NOTE — Progress Notes (Signed)
[facility], [employer].  Progress Note    Assessment:   ESRD on HD MWF at [facility]. Clotted LUE AVF. Using Lt sided TDC.    Chest pain, sharp.    Hx of HTN with CKD   Anemia of CKD.   Hypocalcemia   Hx of non-compliance, routinely cuts short dialysis treatment times, under-dialysis     Plan:   Will do HD today and keep on MWF schedule   Chest pain w/u as per primary team/cardiology.   LUE venous duplex study to R/O DVT. If negative, consider venogram to R/O central venous stenosis.   ESA ordered.     Darlyne Russian, MD  Office [phone]    ++++++++++++++++++++++++++++++++++++++++++++++++++++++++++++++  Subjective:  [name] was [facility] and now presents with complaints of sharp chest pain on Lt side of chest and LUE ongoing x 1 month.     Medications:  Scheduled Meds:  Current Facility-Administered Medications   Medication Dose Route Frequency   . albuterol  2.5 mg Nebulization Q6H SCH   . albuterol-ipratropium  3 mL Nebulization Q6H SCH   . carvedilol  25 mg Oral Q12H SCH   . furosemide  40 mg Intravenous BID   . hydrALAZINE  100 mg Oral TID   . pantoprazole  40 mg Oral Daily   . sevelamer  800 mg Oral TID MEALS     Continuous Infusions:   PRN Meds:HYDROmorphone, nitroglycerin, ondansetron    Objective:  Vital signs in last 24 hours:  Temp:  [96.2 F (35.7 C)-98.1 F (36.7 C)] 96.8 F (36 C)  Heart Rate:  [92-105] 92  Resp Rate:  [16-24] 16  BP: (134-168)/(69-94) 134/78  Intake/Output last 24 hours:  No intake or output data in the 24 hours ending 12/31/15 0901  Intake/Output this shift:  No intake/output data recorded.    Physical Exam:   Gen: WD WN NAD   CV: S1 S2 N RRR   Chest: Bibasilar rales   Ab: ND NT soft no HSM +BS   Ext: No C/E, LUE swollen    Labs:    Recent Labs  Lab 12/30/15  1455   Glucose 97   BUN 69*   Creatinine 7.4*   Calcium 5.5*   Sodium 141   Potassium 4.9   Chloride 104   CO2 21*   Albumin 2.6*   Phosphorus 6.1*    Magnesium 1.8       Recent Labs  Lab 12/30/15  1455   WBC 9.74   Hgb 7.6*   Hematocrit 24.0*   MCV 93.0   MCH 29.5   MCHC 31.7*   RDW 16*   MPV 10.5   Platelets 113*

## 2015-12-31 NOTE — Progress Notes (Signed)
12/31/15 1104   Bedside Nurse Communication   Name of bedside RN - pre dialysis Lilet RN   Treatment Initiation- With Dialysis Precautions   Time Out/Safety Check Completed Yes   Consent for HD signed for this hospitalization Y   Blood Consent Verified N/A   Dialysis Precautions All Connections Secured;Saline Line Double Clamped;Venous Parameters Set;Arterial Parameters Set;Air Foam Detecctor Engaged   Dialysis Treatment Type 1st Time Acute   Is patient diabetic? No   RO/Hemodialysis Cabin crew   Is Total Chlorine less than 0.1 ppm? Yes   Orignial Total Chlorine Testing Time 1045   At 4 Hour Total Chlorine Testing Time 1445   RO/Hemodialysis Dance movement psychotherapist Number 2   RO # 53   Water Hardness 0   pH 7.4   Pressure Test Verified Yes   Alarms Verified Passed   Machine Temperature 96.3 F (35.7 C)   Alarms Verified Yes   Na+ mEq (Machine) 138 mEq   Bicarb mEq (Machine) 35 mEq   Hemodialysis Conductivity (Machine) 14.2   Hemodialysis Conductivity (Meter) 14   Dialyzer Lot Number ([id number] Exp 08/17/2018)   Tubing Lot Number ([id number] Exp 07/18/2018)   RO Machine Log Completed Yes   Hepatitis Status   HBsAg (Antigen) Result Negative   HBsAg Date Drawn 12/17/15   HBsAg Repeat Draw Due Date 12/16/16   HBsAb (Antibody) Result Reactive   Vitals   Temp 97.3 F (36.3 C)   Heart Rate 92   Resp Rate 18   BP 147/82   Assessment   Mental Status Alert;Oriented;Cooperative   Cardiac (WDL) WDL   Cardiac Regularity Regular   Respiratory Pattern Regular   Bilateral Breath Sounds Crackles   R Breath Sounds Crackles   L Breath Sounds Crackles   General Skin Color Appropriate for ethnicity   Skin Condition/Temp Warm;Dry   Gastrointestinal (WDL) WDL   GI Symptoms None   Mobility Bed   Permacath/Temporary Catheter Permacath Left   No Placement Date or Time found.   Present on Admission?: Yes  Access Type: Permacath  Orientation: Left  Securement Method: Sutured;Transparent Dressing  Antimicrobial disc applied  correctly: No  If antimicrobial disc not used, why?: (c) Other (comme...   Dressing Status and Intervention Dressing Intact;Reinforced Dressing   Pain Assessment   Charting Type Assessment   Pain Scale Used Numeric Scale (0-10)   Numeric Pain Scale   Pain Score 0   POSS Score 1   Pain Location Arm;Chest   Pain Orientation Left   Pain Descriptors Sharp   Pain Frequency Intermittent   Effect of Pain on Daily Activities mild   Patient's Stated Comfort Functional Goal 4   Pain Intervention(s) Rest;Medication   Hemodialysis Comments   Pre-Hemodialysis Comments (Patient identified via wrist band.)

## 2015-12-31 NOTE — Progress Notes (Addendum)
Patient Active Problem List    Diagnosis Date Noted   . Chest pain in adult 12/30/2015   . Acute on chronic congestive heart failure 12/30/2015   . Drug-seeking behavior 12/18/2015   . HAP (hospital-acquired pneumonia) 12/17/2015   . Diarrhea 12/17/2015   . Hospital-acquired pneumonia    . Nausea & vomiting 11/09/2015   . Decreased appetite 11/09/2015   . Ascites 11/09/2015   . Diarrhea of infectious origin 11/09/2015   . Acute pain due to trauma 11/17/2013   . Pulmonary contusion, initial encounter 11/17/2013   . 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 Hypertension 11/17/2013         Asessment and plan:  45 y.o. male with h/o reported CAD, EF 35-40% and ESRD presents with chest pain associated with L arm swelling, N/V and fevers. No evidence of ACS by ECG or enzymes. Pain is atypical and continues. Not helped by NTG. Start lidocaine patch for relief. Add long acting nitrates to hydralazine for low EF as is allergic to lisinopril. Consider ARB or spironolactone. Records from [facility] show a cardiac cath July 2015 with minimal CAD and EF 48%. No need for further ischemic testing. LUE doppler pending.      Subjective:  CP persists. Not positional or pleuritic. Mild help with dilaudid.    Exam:  BP 134/78   Pulse 92   Temp (!) 96.8 F (36 C) (Oral)   Resp 16   Ht 1.854 m (6\' 1" )   Wt 111.8 kg (246 lb 6.4 oz)   SpO2 99%   BMI 32.51 kg/m     HEENT: JVP to 6cm   Chest: occ rhonchi  Heart: regular rate and rhythm  Abd: soft, NT, ND  Extr: mild BLE and LUE edema  Neuro: oriented    Telemetry independently reviewed by me shows sinus rhythm and sinus tach    Labs:    Lab Results   Component Value Date    BUN 69 (H) 12/30/2015    NA 141 12/30/2015    K 4.9 12/30/2015    CL 104 12/30/2015    CO2 21 (L) 12/30/2015       Lab Results   Component Value Date    WBC 9.74 12/30/2015    HGB 7.6 (L) 12/30/2015    HCT 24.0 (L) 12/30/2015    MCV 93.0 12/30/2015    PLT 113 (L) 12/30/2015       Lab  Results   Component Value Date    CHOL 171 05/21/2008     Lab Results   Component Value Date    HDL 29 05/21/2008     No components found for: [facility]  Lab Results   Component Value Date    TRIG 258 (H) 05/21/2008     No components found for: CHOLHDL    No components found for: DIGOXIN    No results found for: CKTOTAL, CKMB, CKMBINDEX      Leamon Arnt MD  [facility]    Pg [phone]  Office [phone]

## 2015-12-31 NOTE — Progress Notes (Addendum)
Patient alert and oriented x 4 post HD.  Patient was taken off 15 minutes earlier due to cramping as claimed. Will notify nephrologist. Not in distress and no other complaint given. Dialysis  Access   looked normal.  Able to remove net fluid of 3.5 L.  Report given to his primary nurse.         12/31/15 1419   Treatment Summary   Time Off Machine 1419   Duration of Treatment (Hours) 3.25   Dialyzer Clearance Moderately streaked   Fluid Volume Off (mL) 4000   Prime Volume (mL) 200   Rinseback Volume (mL) 300   Fluid Given: Normal Saline (mL) 0   Fluid Given: PRBC  0 mL   Fluid Given: Albumin (mL) 0   Fluid Given: Other (mL) 0   Total Fluid Given 500   Hemodialysis Net Fluid Removed 3500   Post Treatment Assessment   Patient Response to Treatment (Patient asked to be taken off 15 mins earlier.)   Additional Dialyzer Used 0   Permacath/Temporary Catheter Permacath Left   No Placement Date or Time found.   Present on Admission?: Yes  Access Type: Permacath  Orientation: Left  Securement Method: Sutured;Transparent Dressing  Antimicrobial disc applied correctly: No  If antimicrobial disc not used, why?: (c) Other (comme...   Catheter Lumen Volume Venous 1.9 mL   Catheter Lumen Volume Arterial 1.8 mL   Dressing Status and Intervention Dressing Intact;Reinforced Dressing   Tego Caps on Catheter Yes   NEW Tego Caps placed (Date) 12/31/15   Vitals   Temp 97.5 F (36.4 C)   Heart Rate 97   Resp Rate 18   BP (!) 176/97   Assessment   Mental Status Alert;Oriented;Cooperative   Cardiac (WDL) WDL   Cardiac Regularity Regular   Respiratory Pattern Regular   Bilateral Breath Sounds Crackles   R Breath Sounds Crackles   L Breath Sounds Crackles   General Skin Color Appropriate for ethnicity   Skin Condition/Temp Warm;Dry   Gastrointestinal (WDL) WDL   GI Symptoms None   Mobility Bed   Education   Person taught Patient   Knowledge basis Minimal   Topics taught Fluid Management   Teaching Tools Explain   Reponse Verbalizes  Understanding   Bedside Nurse Communication   Name of bedside RN - post dialysis Georgia Lopes RN

## 2016-01-01 ENCOUNTER — Inpatient Hospital Stay: Payer: Medicare Other

## 2016-01-01 DIAGNOSIS — I429 Cardiomyopathy, unspecified: Secondary | ICD-10-CM

## 2016-01-01 LAB — ECG 12-LEAD
Atrial Rate: 102 {beats}/min
P Axis: 51 degrees
P-R Interval: 164 ms
Q-T Interval: 380 ms
QRS Duration: 72 ms
QTC Calculation (Bezet): 495 ms
R Axis: -6 degrees
T Axis: 81 degrees
Ventricular Rate: 102 {beats}/min

## 2016-01-01 NOTE — Progress Notes (Signed)
[facility] [employer].  Progress Note    Assessment:   ESRD on HD MWF at [facility]. Clotted LUE AVF. Using Lt sided TDC.    Chest pain, sharp.    Hx of HTN with CKD   Anemia of CKD.   Hypocalcemia   Hx of non-compliance, routinely cuts short dialysis treatment times, under-dialysis     Plan:   HD MWF   Getting UE Doppler today to r/o DVT   ESA    Dione Housekeeper, MD  Office [phone]    ++++++++++++++++++++++++++++++++++++++++++++++++++++++++++++++  Subjective:  [name] was recently [facility] from [facility] and now presents with complaints of sharp chest pain on Lt side of chest and LUE ongoing x 1 month.     Medications:  Scheduled Meds:  Current Facility-Administered Medications   Medication Dose Route Frequency   . carvedilol  25 mg Oral Q12H SCH   . darbepoetin alfa  100 mcg Subcutaneous Weekly   . furosemide  40 mg Intravenous BID   . hydrALAZINE  100 mg Oral TID   . isosorbide dinitrate  10 mg Oral TID   . lidocaine  1 patch Transdermal Q24H   . pantoprazole  40 mg Oral Daily   . sevelamer  800 mg Oral TID MEALS     Continuous Infusions:   PRN Meds:albuterol-ipratropium, HYDROmorphone, HYDROmorphone, nitroglycerin, ondansetron    Objective:  Vital signs in last 24 hours:  Temp:  [96.2 F (35.7 C)-96.9 F (36.1 C)] 96.9 F (36.1 C)  Heart Rate:  [95-99] 96  Resp Rate:  [18-22] 22  BP: (161-172)/(78-95) 162/90  Intake/Output last 24 hours:  No intake or output data in the 24 hours ending 01/01/16 1750  Intake/Output this shift:  No intake/output data recorded.      Labs:    Recent Labs  Lab 12/30/15  1455   Glucose 97   BUN 69*   Creatinine 7.4*   Calcium 5.5*   Sodium 141   Potassium 4.9   Chloride 104   CO2 21*   Albumin 2.6*   Phosphorus 6.1*   Magnesium 1.8       Recent Labs  Lab 12/30/15  1455   WBC 9.74   Hgb 7.6*   Hematocrit 24.0*   MCV 93.0   MCH 29.5   MCHC 31.7*   RDW 16*   MPV 10.5   Platelets 113*       Pt not in room

## 2016-01-01 NOTE — Progress Note - Problem Oriented Charting Notewrit (Signed)
MEDICINE PROGRESS NOTE    Date Time: 12/31/2015 6:34PM  Patient Name: [name]  Attending Physician: Mosie Epstein, MD  Hospital Days:1    Assessment:     Patient Active Problem List    Diagnosis Date Noted   . Chest pain in adult 12/30/2015   . Acute on chronic congestive heart failure 12/30/2015   . Drug-seeking behavior 12/18/2015   . HAP (hospital-acquired pneumonia) 12/17/2015   . Diarrhea 12/17/2015   . Hospital-acquired pneumonia    . Nausea & vomiting 11/09/2015   . Decreased appetite 11/09/2015   . Ascites 11/09/2015   . Diarrhea of infectious origin 11/09/2015   . Acute pain due to trauma 11/17/2013   . Pulmonary contusion, initial encounter 11/17/2013   . 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 Hypertension 11/17/2013           Plan:   Oxygen titration wean off to room air   Serial cardiac enzymes negative for acute coronary syndrome    Bronchodilators   Control of agitation  PRN nitroglycerin  BP control with current medication regimen   Echo August 217 EF of 38%, acute systolic dysfunction, medical management   No need to repeat echo  Hyperphosphatemia of ESRD, phosphate binders   Diuresis pending nephrology evaluation for inpatient hemodialysis  ESRD, nephrology for inpatient HD  GERD on PPI  Antiemetics  Anxiolytics   Monitor electrolytes   Daily weight   Swollen left upper extremity.  We will obtain Doppler studies to rule out deep vein thrombosis  PT/OT evaluation and treatment             CC: chest pain and Arm pains        Interval History/24 hour events:  Sob improving   Denies headaches   Chest pain very atypical   Fatigue and Lethargic             Review of Systems:   General ROS: negative for - chills,  fever  Psychological ROS: negative for - anxiety , depression  Ophthalmic ROS: negative for - blurry vision or eye pain  ENT ROS: negative for - nasal congestion or oral lesions  Allergy and Immunology ROS: negative for - hives  Hematological and Lymphatic ROS:  negative for - bleeding problems  Endocrine ROS: negative for - temperature intolerance  Respiratory ROS: cough, shortness of breath, or wheezing  Cardiovascular ROS: no chest pain or dyspnea on exertion  Gastrointestinal ROS: no abdominal pain, change in bowel habits, or black or bloody stools  Genito-Urinary ROS: no dysuria, trouble voiding, or hematuria  Musculoskeletal ROS: negative for - joint pain or joint swelling  Neurological ROS: negative for - numbness/tingling or weakness  Dermatological ROS: negative for rash      Physical Exam:     Patient Vitals for the past 24 hrs:   BP Temp Temp src Pulse Resp SpO2   01/01/16 2045 159/76 - - 67 - 97 %   01/01/16 2043 142/77 - - - - 100 %   01/01/16 2040 143/68 - - - 19 100 %   01/01/16 2007 157/90 (!) 96.7 F (35.9 C) Axillary (!) 103 19 99 %   01/01/16 1553 162/90 (!) 96.9 F (36.1 C) Oral 96 22 99 %   01/01/16 1446 - - - - - 100 %   01/01/16 1115 (!) 172/95 (!) 96.2 F (35.7 C) Axillary 99 22 98 %   01/01/16 0856 162/83 (!) 96.8 F (36  C) - - 18 -   01/01/16 0743 - (!) 96.8 F (36 C) - - 18 -   01/01/16 0422 162/78 (!) 96.6 F (35.9 C) Oral 95 19 95 %   12/31/15 2345 (!) 161/91 (!) 96.4 F (35.8 C) Oral 97 21 96 %     Body mass index is 32.51 kg/m.  No intake or output data in the 24 hours ending 01/01/16 2234    General: awake, alert, oriented x 3; no acute distress.  HEENT: perrla, eomi, sclera anicteric  oropharynx clear without lesions, mucous membranes moist  Neck: supple, no lymphadenopathy, no thyromegaly, no JVD, no carotid bruits  Cardiovascular: regular rate and rhythm, no murmurs, rubs or gallops  Lungs: clear to auscultation bilaterally, without wheezing, rhonchi, or rales  Abdomen: soft, non-tender, non-distended; no palpable masses, no hepatosplenomegaly, normoactive bowel sounds, no rebound or guarding  Extremities: no clubbing, cyanosis, or edema. Bilateral swollen and tender upper extremities   Neuro: A+O x 3, cranial nerves grossly  intact, strength 5/5 in upper and lower extremities, sensation intact,   Skin: no rashes or lesions noted  Meds:     Medications were reviewed:    Labs:     Recent Labs      12/30/15   1455   WBC  9.74   Hgb  7.6*   Hematocrit  24.0*   Platelets  113*   MCV  93.0       Recent Labs      12/30/15   1455   Sodium  141   Potassium  4.9   Chloride  104   CO2  21*   BUN  69*   Creatinine  7.4*   Glucose  97   Calcium  5.5*   Magnesium  1.8   Phosphorus  6.1*       Recent Labs      12/30/15   1455   AST (SGOT)  17   ALT  21   Alkaline Phosphatase  225*   Protein, Total  5.7*   Albumin  2.6*       No results for input(s): PTT, PT, INR in the last 72 hours.    Imaging personally reviewed          Discharge Date: Guarded            Reason to Stay: Fluid overload, Chest pain, bilateral upper extremity swelling and pain         Case discussed with: Lenoria Chime, MD  12/31/2015  06:34PM

## 2016-01-01 NOTE — Progress Notes (Signed)
CARDIOLOGY HOSPITAL VISIT PROGRESS NOTE  Date:  01/01/2016, 1:05 PM  Patient:  [name].  DOB:  10-29-1970.      HPI/ROS:    --- 45 yo with CM presents with CP, N/V and fever  --- no SOB.  No CP.  Ambulated around unit    ASSESSMENT and PLAN:  --- CM   Records from [facility] show a cardiac cath July 2015 with minimal CAD    ECHO 12/18/15  EF 35%   Cont current cardiac meds   Counseled pt to avoid Na  --- Monitor independently reviewed: brief SVT and 3 beat WCT  --- ESRD and anemia    STUDIES:  --- CXR  No active disease    Kinnie Feil, MD   12/30/2015    PHYSICAL EXAM:   Vitals:    01/01/16 1115   BP: (!) 172/95   Pulse: 99   Resp: 22   Temp: (!) 96.2 F (35.7 C)   SpO2: 98%     >> General:   no distress,  obese,  >> Eyes:   anicteric,  >> ENMT:   moist mucosa,  >> Respiratory:   unlabored respiration,  no crackles,  >> Cardiovascular:   regular rhythm,  No LE edema,  >> Gastrointestinal:   Abdomen soft and nontender,  >> Musculoskeletal:   normal muscle tone and strength upper extremities >> Skin:   warm and dry,  >> Neuro/Psych:   Alert,  Appropriate mood and affect,   .  SELECTED LABS:     Recent Labs  Lab 12/30/15  1455   WBC 9.74   Hgb 7.6*   Hematocrit 24.0*   Platelets 113*        Recent Labs  Lab 12/30/15  1455   Sodium 141   Potassium 4.9   Chloride 104   CO2 21*   BUN 69*   Creatinine 7.4*   Magnesium 1.8           Recent Labs  Lab 12/31/15  0550 12/30/15  1455   Troponin I 0.07 0.08       INPATIENT MEDS AT BEGINNING OF VISIT:  INFUSION MEDS:     SCHEDULED MEDS:  Current Facility-Administered Medications   Medication Dose Route Frequency   . carvedilol  25 mg Oral Q12H SCH   . darbepoetin alfa  100 mcg Subcutaneous Weekly   . furosemide  40 mg Intravenous BID   . hydrALAZINE  100 mg Oral TID   . isosorbide dinitrate  10 mg Oral TID   . lidocaine  1 patch Transdermal Q24H   . pantoprazole  40 mg Oral Daily   . sevelamer  800 mg Oral TID MEALS     PRN MEDS:  albuterol-ipratropium, HYDROmorphone,  HYDROmorphone, nitroglycerin, ondansetron    Kruz Chiu Alyson Locket, MD [facility]  [facility]  [facility]  Tel [phone]

## 2016-01-01 NOTE — Plan of Care (Signed)
Problem: Chest Pain  Goal: Vital signs and cardiac rhythm stable  Outcome: Progressing   01/01/16 0136   Goal/Interventions addressed this shift   Vital signs and cardiac rhythm stable Monitor /assess vital signs/cardiac rhythms;Assess the need for oxygen therapy and administer as ordered   Patient is tolerating room air without problem and showing saturation of 98%.

## 2016-01-01 NOTE — Plan of Care (Signed)
Problem: Chest Pain  Goal: Vital signs and cardiac rhythm stable  Outcome: Progressing   01/01/16 0735   Goal/Interventions addressed this shift   Vital signs and cardiac rhythm stable Monitor /assess vital signs/cardiac rhythms;Assess the need for oxygen therapy and administer as ordered

## 2016-01-01 NOTE — Plan of Care (Signed)
Problem: Safety  Goal: Patient will be free from injury during hospitalization  Outcome: Progressing   01/01/16 2351   Goal/Interventions addressed this shift   Patient will be free from injury during hospitalization  Assess patient's risk for falls and implement fall prevention plan of care per policy;Provide and maintain safe environment;Use appropriate transfer methods;Ensure appropriate safety devices are available at the bedside;Include patient/ family/ care giver in decisions related to safety;Hourly rounding

## 2016-01-01 NOTE — Plan of Care (Signed)
Problem: Hemodynamic Status: Cardiac  Goal: Stable vital signs and fluid balance  Outcome: Progressing   01/01/16 2352   Goal/Interventions addressed this shift   Stable vital signs and fluid balance Monitor/assess vital signs and telemetry per unit protocol;Weigh on admission and record weight daily;Assess signs and symptoms associated with cardiac rhythm changes;Monitor intake/output per unit protocol and/or LIP order;Monitor lab values;Monitor for leg swelling/edema and report to LIP if abnormal

## 2016-01-01 NOTE — Plan of Care (Signed)
Problem: Safety  Goal: Patient will be free from injury during hospitalization  Outcome: Progressing   01/01/16 1133   Goal/Interventions addressed this shift   Patient will be free from injury during hospitalization  Assess patient's risk for falls and implement fall prevention plan of care per policy;Provide and maintain safe environment;Hourly rounding;Use appropriate transfer methods;Ensure appropriate safety devices are available at the bedside       Problem: Pain  Goal: Pain at adequate level as identified by patient  Outcome: Progressing   01/01/16 1133   Goal/Interventions addressed this shift   Pain at adequate level as identified by patient Identify patient comfort function goal;Assess for risk of opioid induced respiratory depression, including snoring/sleep apnea. Alert healthcare team of risk factors identified.;Assess pain on admission, during daily assessment and/or before any "as needed" intervention(s);Evaluate patient's satisfaction with pain management progress       Problem: Side Effects from Pain Analgesia  Goal: Patient will experience minimal side effects of analgesic therapy  Outcome: Progressing   01/01/16 1133   Goal/Interventions addressed this shift   Patient will experience minimal side effects of analgesic therapy Monitor/assess patient's respiratory status (RR depth, effort, breath sounds);Assess for changes in cognitive function       Problem: Discharge Barriers  Goal: Patient will be discharged home or other facility with appropriate resources  Outcome: Progressing   01/01/16 1133   Goal/Interventions addressed this shift   Discharge to home or other facility with appropriate resources Provide appropriate patient education;Initiate discharge planning       Problem: Psychosocial and Spiritual Needs  Goal: Demonstrates ability to cope with hospitalization/illness  Outcome: Progressing   01/01/16 1133   Goal/Interventions addressed this shift   Demonstrates ability to cope with  hospitalizations/illness Encourage verbalization of feelings/concerns/expectations;Provide quiet environment;Assist patient to identify own strengths and abilities;Encourage patient to set small goals for self       Problem: Chest Pain  Goal: Vital signs and cardiac rhythm stable  Outcome: Progressing  Patient remains complaining of chest pain and asking for dilaudid around the clock. See MAR. Patient noted watching TV, talking on the phone, and sleeping most of the time. Wakes up to ask dilaudid on time. Medicated as ordered with effective results.

## 2016-01-01 NOTE — Plan of Care (Signed)
Problem: Pain  Goal: Pain at adequate level as identified by patient  Outcome: Progressing  Continue to monitor /assess patient neurological and respiratory status   01/01/16 2352   Goal/Interventions addressed this shift   Pain at adequate level as identified by patient Identify patient comfort function goal;Assess for risk of opioid induced respiratory depression, including snoring/sleep apnea. Alert healthcare team of risk factors identified.;Assess pain on admission, during daily assessment and/or before any "as needed" intervention(s);Reassess pain within 30-60 minutes of any procedure/intervention, per Pain Assessment, Intervention, Reassessment (AIR) Cycle;Evaluate if patient comfort function goal is met;Evaluate patient's satisfaction with pain management progress

## 2016-01-01 NOTE — Progress Note - Problem Oriented Charting Notewrit (Signed)
MEDICINE PROGRESS NOTE    Date Time: 01/01/16 10:27 PM  Patient Name: [name]  Attending Physician: Mosie Epstein, MD  Hospital Days:2    Assessment:     Patient Active Problem List    Diagnosis Date Noted   . Chest pain in adult 12/30/2015   . Acute on chronic congestive heart failure 12/30/2015   . Drug-seeking behavior 12/18/2015   . HAP (hospital-acquired pneumonia) 12/17/2015   . Diarrhea 12/17/2015   . Hospital-acquired pneumonia    . Nausea & vomiting 11/09/2015   . Decreased appetite 11/09/2015   . Ascites 11/09/2015   . Diarrhea of infectious origin 11/09/2015   . Acute pain due to trauma 11/17/2013   . Pulmonary contusion, initial encounter 11/17/2013   . 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 Hypertension 11/17/2013           Plan:   Oxygen titration wean off to room air   Serial cardiac enzymes negative for acute coronary syndrome    Bronchodilators   Control of agitation  PRN nitroglycerin  BP control with current medication regimen   Echo August 217 EF of 38%, acute systolic dysfunction, medical management   No need to repeat echo  Hyperphosphatemia of ESRD, phosphate binders   Diuresis pending nephrology evaluation for inpatient hemodialysis  ESRD, nephrology for inpatient HD  GERD on PPI  Antiemetics  Anxiolytics   Monitor electrolytes   Daily weight   Swollen left upper extremity.  We will obtain Doppler studies to rule out deep vein thrombosis  PT/OT evaluation and treatment             CC: chest pain and Arm pains        Interval History/24 hour events:  Sob improving   Denies headaches   Chest pain very atypical   Fatigue and Lethargic             Review of Systems:   General ROS: negative for - chills,  fever  Psychological ROS: negative for - anxiety , depression  Ophthalmic ROS: negative for - blurry vision or eye pain  ENT ROS: negative for - nasal congestion or oral lesions  Allergy and Immunology ROS: negative for - hives  Hematological and Lymphatic ROS:  negative for - bleeding problems  Endocrine ROS: negative for - temperature intolerance  Respiratory ROS: cough, shortness of breath, or wheezing  Cardiovascular ROS: no chest pain or dyspnea on exertion  Gastrointestinal ROS: no abdominal pain, change in bowel habits, or black or bloody stools  Genito-Urinary ROS: no dysuria, trouble voiding, or hematuria  Musculoskeletal ROS: negative for - joint pain or joint swelling  Neurological ROS: negative for - numbness/tingling or weakness  Dermatological ROS: negative for rash      Physical Exam:     Patient Vitals for the past 24 hrs:   BP Temp Temp src Pulse Resp SpO2   01/01/16 2045 159/76 - - 67 - 97 %   01/01/16 2043 142/77 - - - - 100 %   01/01/16 2040 143/68 - - - 19 100 %   01/01/16 2007 157/90 (!) 96.7 F (35.9 C) Axillary (!) 103 19 99 %   01/01/16 1553 162/90 (!) 96.9 F (36.1 C) Oral 96 22 99 %   01/01/16 1446 - - - - - 100 %   01/01/16 1115 (!) 172/95 (!) 96.2 F (35.7 C) Axillary 99 22 98 %   01/01/16 0856 162/83 (!) 96.8 F (  36 C) - - 18 -   01/01/16 0743 - (!) 96.8 F (36 C) - - 18 -   01/01/16 0422 162/78 (!) 96.6 F (35.9 C) Oral 95 19 95 %   12/31/15 2345 (!) 161/91 (!) 96.4 F (35.8 C) Oral 97 21 96 %     Body mass index is 32.51 kg/m.  No intake or output data in the 24 hours ending 01/01/16 2227    General: awake, alert, oriented x 3; no acute distress.  HEENT: perrla, eomi, sclera anicteric  oropharynx clear without lesions, mucous membranes moist  Neck: supple, no lymphadenopathy, no thyromegaly, no JVD, no carotid bruits  Cardiovascular: regular rate and rhythm, no murmurs, rubs or gallops  Lungs: clear to auscultation bilaterally, without wheezing, rhonchi, or rales  Abdomen: soft, non-tender, non-distended; no palpable masses, no hepatosplenomegaly, normoactive bowel sounds, no rebound or guarding  Extremities: no clubbing, cyanosis, or edema. Bilateral swollen and tender upper extremities   Neuro: A+O x 3, cranial nerves grossly  intact, strength 5/5 in upper and lower extremities, sensation intact,   Skin: no rashes or lesions noted  Meds:     Medications were reviewed:    Labs:     Recent Labs      12/30/15   1455   WBC  9.74   Hgb  7.6*   Hematocrit  24.0*   Platelets  113*   MCV  93.0       Recent Labs      12/30/15   1455   Sodium  141   Potassium  4.9   Chloride  104   CO2  21*   BUN  69*   Creatinine  7.4*   Glucose  97   Calcium  5.5*   Magnesium  1.8   Phosphorus  6.1*       Recent Labs      12/30/15   1455   AST (SGOT)  17   ALT  21   Alkaline Phosphatase  225*   Protein, Total  5.7*   Albumin  2.6*       No results for input(s): PTT, PT, INR in the last 72 hours.    Imaging personally reviewed          Discharge Date: Guarded            Reason to Stay: Fluid overload, Chest pain, bilateral upper extremity swelling and pain         Case discussed with: Lenoria Chime, MD  01/01/2016  10:27 PM

## 2016-01-02 DIAGNOSIS — I10 Essential (primary) hypertension: Secondary | ICD-10-CM

## 2016-01-02 LAB — RENAL FUNCTION PANEL
Albumin: 2.6 g/dL — ABNORMAL LOW (ref 3.5–5.0)
Anion Gap: 17 — ABNORMAL HIGH (ref 5.0–15.0)
BUN: 56 mg/dL — ABNORMAL HIGH (ref 9–28)
CO2: 23 mEq/L (ref 22–29)
Calcium: 5.5 mg/dL — CL (ref 8.5–10.5)
Chloride: 104 mEq/L (ref 100–111)
Creatinine: 8 mg/dL — ABNORMAL HIGH (ref 0.7–1.3)
Glucose: 108 mg/dL — ABNORMAL HIGH (ref 70–100)
Phosphorus: 6.6 mg/dL — ABNORMAL HIGH (ref 2.3–4.7)
Potassium: 4.6 mEq/L (ref 3.5–5.1)
Sodium: 144 mEq/L (ref 136–145)

## 2016-01-02 LAB — CBC AND DIFFERENTIAL
Absolute NRBC: 0 10*3/uL
Basophils Absolute Automated: 0.02 10*3/uL (ref 0.00–0.20)
Basophils Automated: 0.2 %
Eosinophils Absolute Automated: 0.47 10*3/uL (ref 0.00–0.70)
Eosinophils Automated: 5.5 %
Hematocrit: 24.2 % — ABNORMAL LOW (ref 42.0–52.0)
Hgb: 7.5 g/dL — ABNORMAL LOW (ref 13.0–17.0)
Immature Granulocytes Absolute: 0.07 10*3/uL — ABNORMAL HIGH
Immature Granulocytes: 0.8 %
Lymphocytes Absolute Automated: 0.52 10*3/uL (ref 0.50–4.40)
Lymphocytes Automated: 6 %
MCH: 29.2 pg (ref 28.0–32.0)
MCHC: 31 g/dL — ABNORMAL LOW (ref 32.0–36.0)
MCV: 94.2 fL (ref 80.0–100.0)
MPV: 10.7 fL (ref 9.4–12.3)
Monocytes Absolute Automated: 0.61 10*3/uL (ref 0.00–1.20)
Monocytes: 7.1 %
Neutrophils Absolute: 6.92 10*3/uL (ref 1.80–8.10)
Neutrophils: 80.4 %
Nucleated RBC: 0 /100 WBC (ref 0.0–1.0)
Platelets: 116 10*3/uL — ABNORMAL LOW (ref 140–400)
RBC: 2.57 10*6/uL — ABNORMAL LOW (ref 4.70–6.00)
RDW: 17 % — ABNORMAL HIGH (ref 12–15)
WBC: 8.61 10*3/uL (ref 3.50–10.80)

## 2016-01-02 LAB — GFR: EGFR: 8.9

## 2016-01-02 MED ORDER — AMLODIPINE BESYLATE 5 MG PO TABS
10.0000 mg | ORAL_TABLET | Freq: Every day | ORAL | Status: DC
Start: 2016-01-02 — End: 2016-01-05
  Administered 2016-01-02 – 2016-01-05 (×4): 10 mg via ORAL
  Filled 2016-01-02 (×4): qty 2

## 2016-01-02 MED ORDER — SODIUM CHLORIDE 0.9 % IV SOLN
1.0000 g | Freq: Once | INTRAVENOUS | Status: AC
Start: 2016-01-02 — End: 2016-01-02
  Administered 2016-01-02: 1 g via INTRAVENOUS
  Filled 2016-01-02: qty 10

## 2016-01-02 MED ORDER — CALCIUM CARBONATE ANTACID 500 MG PO CHEW
1000.0000 mg | CHEWABLE_TABLET | Freq: Two times a day (BID) | ORAL | Status: DC
Start: 2016-01-02 — End: 2016-01-05
  Administered 2016-01-02 – 2016-01-05 (×6): 1000 mg via ORAL
  Filled 2016-01-02 (×6): qty 2

## 2016-01-02 NOTE — Progress Notes (Signed)
Pt off unit to dialysis.  IV lasix and BP meds held.

## 2016-01-02 NOTE — Progress Notes (Signed)
BP high even when rechecked currently 176/112, patient asymptomatic. Primary nurse notified.  Will given BP pill ASAP.

## 2016-01-02 NOTE — Progress Notes (Signed)
Patient demanded to be taken off his treatment an hour earlier. Explained to the patient the adverse effect of  being underdialyzed constanty including early death. Patient very uncooperative.  He claimed he was having cramping and would get sick if I would not take him off soon.  Has mild tachypnea but not in distress. Still on low dose oxygen via nasal cannula. Reinforced dressing. Able to remove net fluid of 28 L.  Nephrologist notified and report given to his primary nurse.      01/02/16 1222   Treatment Summary   Time Off Machine 1222   Duration of Treatment (Hours) 2.5   Dialyzer Clearance Moderately streaked   Fluid Volume Off (mL) 3300   Prime Volume (mL) 200   Rinseback Volume (mL) 300   Fluid Given: Normal Saline (mL) 0   Fluid Given: PRBC  0 mL   Fluid Given: Albumin (mL) 0   Fluid Given: Other (mL) 0   Total Fluid Given 500   Hemodialysis Net Fluid Removed 2800   Post Treatment Assessment   Patient Response to Treatment (Pt demand to be taken off 1 hour early. Very uncooperative.)   Additional Dialyzer Used 0   Permacath/Temporary Catheter Permacath Left   No Placement Date or Time found.   Present on Admission?: Yes  Access Type: Permacath  Orientation: Left  Securement Method: Sutured;Transparent Dressing  Antimicrobial disc applied correctly: No  If antimicrobial disc not used, why?: (c) Other (comme...   Catheter Lumen Volume Venous 1.9 mL   Catheter Lumen Volume Arterial 1.8 mL   Dressing Status and Intervention Dressing Intact;Reinforced Dressing   Tego Caps on Catheter Yes   NEW Tego Caps placed (Date) 12/31/15   Vitals   Temp 97.6 F (36.4 C)   Heart Rate 99   Resp Rate 18   BP (!) 190/99   Assessment   Mental Status Alert;Oriented;Uncoorporative   Cardiac (WDL) WDL   Cardiac Regularity Regular   Respiratory Pattern Dyspnea with exertion   Bilateral Breath Sounds Diminished   R Breath Sounds Diminished   L Breath Sounds Diminished   RLE Edema Non Pitting Edema   LLE Edema Non Pitting Edema    General Skin Color Appropriate for ethnicity   Skin Condition/Temp Warm;Dry   Gastrointestinal (WDL) WDL   GI Symptoms None   Mobility Bed   Education   Person taught Patient   Knowledge basis Minimal   Topics taught Fluid Management   Teaching Tools Explain   Gean Maidens Understanding   Bedside Nurse Communication   Name of bedside RN - post dialysis Reuel Boom RN

## 2016-01-02 NOTE — Progress Notes (Signed)
01/02/16 0940   Bedside Nurse Communication   Name of bedside RN - pre dialysis Reuel Boom RN   Treatment Initiation- With Dialysis Precautions   Time Out/Safety Check Completed Yes   Consent for HD signed for this hospitalization Y   Blood Consent Verified N/A   Dialysis Precautions All Connections Secured;Saline Line Double Clamped;Venous Parameters Set;Arterial Parameters Set;Air Foam Detecctor Engaged   Dialysis Treatment Type Routine   Is patient diabetic? No   RO/Hemodialysis Cabin crew   Is Total Chlorine less than 0.1 ppm? Yes   Orignial Total Chlorine Testing Time 0930   At 4 Hour Total Chlorine Testing Time 1330   RO/Hemodialysis Dance movement psychotherapist Number 1   RO # 54   Water Hardness 0   pH 7.4   Pressure Test Verified Yes   Alarms Verified Passed   Machine Temperature 95.9 F (35.5 C)   Alarms Verified Yes   Na+ mEq (Machine) 138 mEq   Bicarb mEq (Machine) 35 mEq   Hemodialysis Conductivity (Machine) 14   Hemodialysis Conductivity (Meter) 14   Dialyzer Lot Number ([id number] 08/17/2018)   Tubing Lot Number ([id number] 09/17/2018)   RO Machine Log Completed Yes   Hepatitis Status   HBsAg (Antigen) Result Negative   HBsAg Date Drawn 12/17/15   HBsAg Repeat Draw Due Date 12/16/16   HBsAb (Antibody) Result Reactive   Vitals   Temp 98.2 F (36.8 C)   Heart Rate (!) 104   Resp Rate 18   BP (!) 173/93   Assessment   Mental Status Alert;Oriented   Cardiac (WDL) WDL   Cardiac Regularity Regular   Respiratory Pattern Dyspnea with exertion   Bilateral Breath Sounds Diminished   R Breath Sounds Diminished   L Breath Sounds Diminished   RLE Edema Non Pitting Edema   LLE Edema Non Pitting Edema   General Skin Color Appropriate for ethnicity   Skin Condition/Temp Warm;Dry   Gastrointestinal (WDL) WDL   GI Symptoms None   Mobility Bed   Permacath/Temporary Catheter Permacath Left   No Placement Date or Time found.   Present on Admission?: Yes  Access Type: Permacath  Orientation: Left   Securement Method: Sutured;Transparent Dressing  Antimicrobial disc applied correctly: No  If antimicrobial disc not used, why?: (c) Other (comme...   Dressing Status and Intervention Dressing Intact;Reinforced Dressing   Pain Assessment   Charting Type Assessment   Pain Scale Used Numeric Scale (0-10)   Numeric Pain Scale   Pain Score 7   POSS Score 1   Pain Location Arm   Pain Orientation Right;Left   Pain Descriptors Aching   Pain Frequency Intermittent   Effect of Pain on Daily Activities mild   Patient's Stated Comfort Functional Goal 4   Pain Intervention(s) Medication;Repositioned;Relaxation techniques   Hemodialysis Comments   Pre-Hemodialysis Comments (Patient identified via wrist band.)

## 2016-01-02 NOTE — Progress Notes (Signed)
Pt back on unit. AAO x 4.  2.8 L of fluid removed during dialysis.

## 2016-01-02 NOTE — Progress Note - Problem Oriented Charting Notewrit (Signed)
MEDICINE PROGRESS NOTE    Date Time: 01/02/16 6:10 PM  Patient Name: [name] D  Attending Physician: Mosie Epstein, MD  Hospital Days:3    Assessment:     Patient Active Problem List    Diagnosis Date Noted   . Chest pain in adult 12/30/2015   . Acute on chronic congestive heart failure 12/30/2015   . Drug-seeking behavior 12/18/2015   . HAP (hospital-acquired pneumonia) 12/17/2015   . Diarrhea 12/17/2015   . Hospital-acquired pneumonia    . Nausea & vomiting 11/09/2015   . Decreased appetite 11/09/2015   . Ascites 11/09/2015   . Diarrhea of infectious origin 11/09/2015   . Acute pain due to trauma 11/17/2013   . Pulmonary contusion, initial encounter 11/17/2013   . 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 Hypertension 11/17/2013           Plan:   Hypocalcemia supplemented,   Repeat Labs in the AM   Daily calcium supplementation   Oxygen titration wean off to room air   Serial cardiac enzymes negative for acute coronary syndrome    Bronchodilators   Control of agitation  PRN nitroglycerin  BP control with current medication regimen   Echo August 217 EF of 38%, acute systolic dysfunction, medical management   No need to repeat echo  Hyperphosphatemia of ESRD, phosphate binders   Diuresis pending nephrology evaluation for inpatient hemodialysis  ESRD, nephrology for inpatient HD  GERD on PPI  Antiemetics  Anxiolytics   Monitor electrolytes   Daily weight   Swollen left upper extremity.  We will obtain Doppler studies to rule out deep vein thrombosis  PT/OT evaluation and treatment             CC: chest pain and Arm pains        Interval History/24 hour events:  Sob improving   Denies headaches   Chest pain very atypical   Fatigue and Lethargic             Review of Systems:   General ROS: negative for - chills,  fever  Psychological ROS: negative for - anxiety , depression  Ophthalmic ROS: negative for - blurry vision or eye pain  ENT ROS: negative for - nasal congestion or oral  lesions  Allergy and Immunology ROS: negative for - hives  Hematological and Lymphatic ROS: negative for - bleeding problems  Endocrine ROS: negative for - temperature intolerance  Respiratory ROS: cough, shortness of breath, or wheezing  Cardiovascular ROS: no chest pain or dyspnea on exertion  Gastrointestinal ROS: no abdominal pain, change in bowel habits, or black or bloody stools  Genito-Urinary ROS: no dysuria, trouble voiding, or hematuria  Musculoskeletal ROS: negative for - joint pain or joint swelling  Neurological ROS: negative for - numbness/tingling or weakness  Dermatological ROS: negative for rash      Physical Exam:     Patient Vitals for the past 24 hrs:   BP Temp Temp src Pulse Resp SpO2 Weight   01/02/16 1722 (!) 167/93 - - - - - -   01/02/16 1500 144/84 (!) 96.2 F (35.7 C) Oral 89 19 99 % -   01/02/16 1319 (!) 153/92 (!) 96.4 F (35.8 C) Oral 93 18 100 % -   01/02/16 1222 (!) 190/99 97.6 F (36.4 C) - 99 18 - -   01/02/16 1200 (!) 223/98 - - (!) 104 18 - -   01/02/16 1139 (!) 176/112 - - Marland Kitchen)  106 18 - -   01/02/16 1130 - - - - 18 - -   01/02/16 1100 (!) 136/97 - - (!) 108 18 - -   01/02/16 1030 182/73 - - (!) 112 18 - -   01/02/16 1000 (!) 162/104 - - (!) 107 18 - -   01/02/16 0948 (!) 173/93 98.2 F (36.8 C) - - 18 - -   01/02/16 0940 (!) 173/93 98.2 F (36.8 C) - (!) 104 18 - -   01/02/16 0808 (!) 190/109 (!) 96.8 F (36 C) Oral 100 19 97 % -   01/02/16 0729 - - - - - - 108.3 kg (238 lb 12.8 oz)   01/02/16 0406 (!) 178/95 (!) 96.4 F (35.8 C) Oral (!) 104 19 95 % -   01/01/16 2323 (!) 169/93 97.3 F (36.3 C) Oral 98 20 100 % -   01/01/16 2045 159/76 - - 67 - 97 % -   01/01/16 2043 142/77 - - - - 100 % -   01/01/16 2040 143/68 - - - 19 100 % -   01/01/16 2007 157/90 (!) 96.7 F (35.9 C) Axillary (!) 103 19 99 % -     Body mass index is 31.51 kg/m.    Intake/Output Summary (Last 24 hours) at 01/02/16 1810  Last data filed at 01/02/16 1222   Gross per 24 hour   Intake             1050 ml    Output             2800 ml   Net            -1750 ml       General: awake, alert, oriented x 3; no acute distress.  HEENT: perrla, eomi, sclera anicteric  oropharynx clear without lesions, mucous membranes moist  Neck: supple, no lymphadenopathy, no thyromegaly, no JVD, no carotid bruits  Cardiovascular: regular rate and rhythm, no murmurs, rubs or gallops  Lungs: clear to auscultation bilaterally, without wheezing, rhonchi, or rales  Abdomen: soft, non-tender, non-distended; no palpable masses, no hepatosplenomegaly, normoactive bowel sounds, no rebound or guarding  Extremities: no clubbing, cyanosis, or edema. Bilateral swollen and tender upper extremities   Neuro: A+O x 3, cranial nerves grossly intact, strength 5/5 in upper and lower extremities, sensation intact,   Skin: no rashes or lesions noted  Meds:     Medications were reviewed:    Labs:     Recent Labs      01/02/16   1044   WBC  8.61   Hgb  7.5*   Hematocrit  24.2*   Platelets  116*   MCV  94.2       Recent Labs      01/02/16   1044   Sodium  144   Potassium  4.6   Chloride  104   CO2  23   BUN  56*   Creatinine  8.0*   Glucose  108*   Calcium  5.5*   Phosphorus  6.6*       Recent Labs      01/02/16   1044   Albumin  2.6*       No results for input(s): PTT, PT, INR in the last 72 hours.    Imaging personally reviewed          Discharge Date: Guarded            Reason to Stay: Fluid overload, Chest pain,  bilateral upper extremity swelling and pain         Case discussed with: Lenoria Chime, MD  01/02/2016  6:10 PM

## 2016-01-02 NOTE — Progress Notes (Signed)
[facility], [employer].  Progress Note    Assessment:   ESRD on HD MWF at [facility]. malfunctioning LUE AVF per patient. Using Lt sided TDC.    Chest pain   Hx of HTN with CKD   Anemia of CKD.   Hypocalcemia   Hx of non-compliance, routinely cuts short dialysis treatment times, under-dialysis     Plan:   HD MWF, HD today with UF   No DVT   Fluid restriction discussed with patient   [facility]A    Doristine Locks, MD  Office - [phone]    ++++++++++++++++++++++++++++++++++++++++++++++++++++++++++++++  Subjective:  + SOB, no CP at this time.    Medications:  Scheduled Meds:  Current Facility-Administered Medications   Medication Dose Route Frequency   . carvedilol  25 mg Oral Q12H SCH   . darbepoetin alfa  100 mcg Subcutaneous Weekly   . furosemide  40 mg Intravenous BID   . hydrALAZINE  100 mg Oral TID   . isosorbide dinitrate  10 mg Oral TID   . lidocaine  1 patch Transdermal Q24H   . pantoprazole  40 mg Oral Daily   . sevelamer  800 mg Oral TID MEALS     Continuous Infusions:   PRN Meds:albuterol-ipratropium, HYDROmorphone, HYDROmorphone, nitroglycerin, ondansetron    Objective:  Vital signs in last 24 hours:  Temp:  [96.2 F (35.7 C)-97.3 F (36.3 C)] 96.8 F (36 C)  Heart Rate:  [67-104] 100  Resp Rate:  [19-22] 19  BP: (142-190)/(68-109) 190/109  Intake/Output last 24 hours:    Intake/Output Summary (Last 24 hours) at 01/02/16 0905  Last data filed at 01/02/16 0406   Gross per 24 hour   Intake              750 ml   Output                0 ml   Net              750 ml     Intake/Output this shift:  No intake/output data recorded.      Labs:    Recent Labs  Lab 12/30/15  1455   Glucose 97   BUN 69*   Creatinine 7.4*   Calcium 5.5*   Sodium 141   Potassium 4.9   Chloride 104   CO2 21*   Albumin 2.6*   Phosphorus 6.1*   Magnesium 1.8       Recent Labs  Lab 12/30/15  1455   WBC 9.74   Hgb 7.6*   Hematocrit 24.0*   MCV 93.0   MCH 29.5   MCHC 31.7*   RDW 16*   MPV 10.5   Platelets  113*       Pt not in room

## 2016-01-02 NOTE — Progress Notes (Signed)
Beta blocker given during HD d/t elevated BP.

## 2016-01-02 NOTE — Progress Notes (Signed)
CARDIOLOGY HOSPITAL VISIT PROGRESS NOTE  Date:  01/02/2016, 10:08 AM  Patient:  [name].  DOB:  1971-02-01.      HPI/ROS:    --- 45 yo with CM presents with CP, N/V and fever  --- c/o SOB this morning.  No CP    ASSESSMENT and PLAN:  --- CM   Records from [facility]: cardiac cath July 2015 with minimal CAD    ECHO 12/18/15  EF 35%   (Counseled pt 9/14th to avoid Na)   Discussed with tech and 4 liters to be dialyzed today  --- HTN   Uncontrolled   Add amlodipine  --- noncompliance   Counseled pt regarding importance of not missing meds and dialysis  --- Monitor independently reviewed: sinus (brief SVT and 3-4 beat WCT 7/12th)  --- ESRD and anemia    STUDIES:  --- CXR  No active disease  12/30/2015    --- ECHO 12/18/15  EF 35%    PHYSICAL EXAM:   Vitals:    01/02/16 1000   BP: (!) 162/104   Pulse: (!) 107   Resp: 18   Temp:    SpO2:      >> General:   no distress,  obese,  >> Respiratory:   unlabored respiration,  no wheeze,  no crackles,  >> Cardiovascular:   regular rhythm,  No LE edema,  >> Gastrointestinal:   Abdomen soft and nontender,  >> Musculoskeletal:   normal muscle tone and strength upper extremities >> Skin:   warm and dry,  >> Neuro/Psych:   Alert,  Appropriate mood and affect,   .  SELECTED LABS:     Recent Labs  Lab 12/30/15  1455   WBC 9.74   Hgb 7.6*   Hematocrit 24.0*   Platelets 113*        Recent Labs  Lab 12/30/15  1455   Sodium 141   Potassium 4.9   Chloride 104   CO2 21*   BUN 69*   Creatinine 7.4*   Magnesium 1.8           Recent Labs  Lab 12/31/15  0550 12/30/15  1455   Troponin I 0.07 0.08       INPATIENT MEDS AT BEGINNING OF VISIT:  INFUSION MEDS:     SCHEDULED MEDS:  Current Facility-Administered Medications   Medication Dose Route Frequency   . carvedilol  25 mg Oral Q12H SCH   . darbepoetin alfa  100 mcg Subcutaneous Weekly   . furosemide  40 mg Intravenous BID   . hydrALAZINE  100 mg Oral TID   . isosorbide dinitrate  10 mg Oral TID   . lidocaine  1 patch Transdermal Q24H   .  pantoprazole  40 mg Oral Daily   . sevelamer  800 mg Oral TID MEALS     PRN MEDS:  albuterol-ipratropium, HYDROmorphone, HYDROmorphone, nitroglycerin, ondansetron    Daevion Navarette Alyson Locket, MD [facility]  [facility]  [facility] [place]  Tel [phone]

## 2016-01-02 NOTE — Progress Notes (Signed)
If pt receives dialysis tomorrow (9/16) or Sunday, do not hold BP meds prior to dialysis as per cardiology.

## 2016-01-02 NOTE — Progress Notes (Signed)
Patient alert and oriented x 4. Time out done. Has baseline soreness on both arms, claimed he was medicated in the floor.  Not in distress.  On low dose oxygen at 2-3 L via nasal cannula. No other complaint given. Has a left functional permacath, CDI, and dialysis initiated using this access without problem. Currently hypertensive, asymptomatic.  Will attempt to remove net fluid as ordered.  Will closely monitor.         01/02/16 0948   Permacath/Temporary Catheter Permacath Left   No Placement Date or Time found.   Present on Admission?: Yes  Access Type: Permacath  Orientation: Left  Securement Method: Sutured;Transparent Dressing  Antimicrobial disc applied correctly: No  If antimicrobial disc not used, why?: (c) Other (comme...   Line necessity reviewed? Dialysis   Catheter Lumen Volume Venous 1.9 mL   Catheter Lumen Volume Arterial 1.8 mL   Dressing Status and Intervention Dressing Intact;Reinforced Dressing   Tego Caps on Catheter Yes   NEW Tego Caps placed (Date) 12/31/15   End Caps Free From Blood Yes   Line Care Connections checked and tightened   Dressing Type Transparent   Dressing Status Clean;Dry;Intact   Dressing/Line Intervention Dressing reinforced   Biopatch Used? ([facility] Only) Yes   Line Used For Blood Draw Yes   Dressing Change Due 01/05/16   Vitals   Temp 98.2 F (36.8 C)   Resp Rate 18   BP (!) 173/93   Machine Sports coach Yes   Blood Flow Rate (mL/min) 350 mL/min   Arterial Pressure (mmHg) -160 mmHg   Venous Pressure (mmHg) 150   Dialysate Flow Rate (mL/min) 700 mL/min   Transmembrane Pressure (mmHg) 70 mmHg   Ultrafiltration Rate (mL/Hr) 1290 mL/hr   Fluid Removal (ml) 0 ml   Dialysate K (mEq/L) 2 mEq/L   Dialysate CA (mEq/L) 2.5 mEq/L   Hemodialysis Comments   Arteriovenous Lines Secure Yes   Comments (Time out done.)

## 2016-01-02 NOTE — Plan of Care (Signed)
Problem: Safety  Goal: Patient will be free from injury during hospitalization  Outcome: Progressing   01/01/16 2351   Goal/Interventions addressed this shift   Patient will be free from injury during hospitalization  Assess patient's risk for falls and implement fall prevention plan of care per policy;Provide and maintain safe environment;Use appropriate transfer methods;Ensure appropriate safety devices are available at the bedside;Include patient/ family/ care giver in decisions related to safety;Hourly rounding       Problem: Pain  Goal: Pain at adequate level as identified by patient  Outcome: Progressing  IV dilaudid administered per pt request q 4 hrs.   01/02/16 1412   Goal/Interventions addressed this shift   Pain at adequate level as identified by patient Identify patient comfort function goal;Assess for risk of opioid induced respiratory depression, including snoring/sleep apnea. Alert healthcare team of risk factors identified.;Assess pain on admission, during daily assessment and/or before any "as needed" intervention(s);Evaluate if patient comfort function goal is met;Reassess pain within 30-60 minutes of any procedure/intervention, per Pain Assessment, Intervention, Reassessment (AIR) Cycle;Evaluate patient's satisfaction with pain management progress;Include patient/patient care companion in decisions related to pain management as needed       Problem: Hemodynamic Status: Cardiac  Goal: Stable vital signs and fluid balance   01/02/16 1412   Goal/Interventions addressed this shift   Stable vital signs and fluid balance Monitor/assess vital signs and telemetry per unit protocol;Weigh on admission and record weight daily;Assess signs and symptoms associated with cardiac rhythm changes;Monitor intake/output per unit protocol and/or LIP order;Monitor lab values   Pt BP elevated.  Cardiology added Amlodipine.  Will continue to monitor.

## 2016-01-03 MED ORDER — SODIUM CHLORIDE 0.9 % IV BOLUS
250.0000 mL | INTRAVENOUS | Status: AC | PRN
Start: 2016-01-03 — End: 2016-01-03

## 2016-01-03 MED ORDER — SODIUM CHLORIDE 0.9 % IV BOLUS
100.0000 mL | INTRAVENOUS | Status: AC | PRN
Start: 2016-01-03 — End: 2016-01-03

## 2016-01-03 NOTE — Progress Notes (Signed)
Pt has refused dialysis this morning.  Nephrologist saw pt and pt will be continue to dialysis today.  Pt has been very vocal about going to dialysis with a systolic BP of 136.  Nephrologist discontinued dialysis today as pt number looked better.  Pt BP is consistently elevated and pt states that this is his norm.    Pt has a chronic pain issue. Pt gets dilaudid every four hours and will start to complain when its not or time.      Pt is to continue dialysis on Monday at an outpatient clinic

## 2016-01-03 NOTE — Progress Notes (Addendum)
[facility] [employer].  Progress Note    Assessment:   ESRD on HD MWF at [facility]. malfunctioning LUE AVF per patient. Using Lt sided TDC.    Chest pain   Hx of HTN with CKD   Anemia of CKD.   Hypocalcemia   Hx of non-compliance, routinely cuts short dialysis treatment times, under-dialysis     Plan:   HD MWF, cutting treatments short   Does not need repeat HD today   Transfuse PRN as per primary team   ESA   Okay for d/c from renal standpoint    Darlyne Russian, MD  Office - [phone]    ++++++++++++++++++++++++++++++++++++++++++++++++++++++++++++++  Subjective:  Does not want dialysis until later on in the day as he received his BP meds in AM today and is worried about his "BP bottoming out"    Medications:  Scheduled Meds:  Current Facility-Administered Medications   Medication Dose Route Frequency   . amLODIPine  10 mg Oral Daily   . calcium carbonate  1,000 mg Oral BID   . carvedilol  25 mg Oral Q12H SCH   . darbepoetin alfa  100 mcg Subcutaneous Weekly   . furosemide  40 mg Intravenous BID   . hydrALAZINE  100 mg Oral TID   . isosorbide dinitrate  10 mg Oral TID   . lidocaine  1 patch Transdermal Q24H   . pantoprazole  40 mg Oral Daily   . sevelamer  800 mg Oral TID MEALS     Continuous Infusions:   PRN Meds:albuterol-ipratropium, HYDROmorphone, HYDROmorphone, nitroglycerin, ondansetron, sodium chloride, sodium chloride    Objective:  Vital signs in last 24 hours:  Temp:  [96.1 F (35.6 C)-97.6 F (36.4 C)] 96.9 F (36.1 C)  Heart Rate:  [89-104] 96  Resp Rate:  [18-20] 20  BP: (135-223)/(84-99) 155/95  Intake/Output last 24 hours:    Intake/Output Summary (Last 24 hours) at 01/03/16 1147  Last data filed at 01/03/16 0700   Gross per 24 hour   Intake              500 ml   Output             2800 ml   Net            -2300 ml     Intake/Output this shift:  No intake/output data recorded.      Labs:    Recent Labs  Lab 01/02/16  1044 12/30/15  1455   Glucose 108* 97    BUN 56* 69*   Creatinine 8.0* 7.4*   Calcium 5.5* 5.5*   Sodium 144 141   Potassium 4.6 4.9   Chloride 104 104   CO2 23 21*   Albumin 2.6* 2.6*   Phosphorus 6.6* 6.1*   Magnesium  --  1.8       Recent Labs  Lab 01/02/16  1044 12/30/15  1455   WBC 8.61 9.74   Hgb 7.5* 7.6*   Hematocrit 24.2* 24.0*   MCV 94.2 93.0   MCH 29.2 29.5   MCHC 31.0* 31.7*   RDW 17* 16*   MPV 10.7 10.5   Platelets 116* 113*       Gen: NAD  CVS: Normal s1 and s2  Chest: decr BS @ bases  Abdo: soft  Ext: no edema

## 2016-01-03 NOTE — Consults (Signed)
[name] MRN: [id number]  45 y.o.  male    NUTRITION:  Reason for assessment:     Assessment     Subjective: "I don't need any diet education."    Past Medical History:   Diagnosis Date   . Asthma without status asthmaticus    . 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 Clostridium difficile diarrhea    . Drug-seeking behavior    . Hypertension    . Kidney failure      Wt Readings from Last 30 Encounters:   01/03/16 105.8 kg (233 lb 3.2 oz)   12/18/15 97.1 kg (214 lb 1.1 oz)   11/12/15 110.8 kg (244 lb 4.3 oz)   10/18/15 118.8 kg (261 lb 14.5 oz)   08/30/15 99.3 kg (219 lb)   08/25/15 111.1 kg (245 lb)   11/16/13 111.1 kg (245 lb)     Social History     Social History   . Marital status: Single     Spouse name: N/A   . Number of children: N/A   . Years of education: N/A     Occupational History   . Not on file.     Social History Main Topics   . Smoking status: Former Smoker     Packs/day: 0.50     Types: Cigarettes     Quit date: 09/18/2015   . Smokeless tobacco: Former Neurosurgeon   . Alcohol use No   . Drug use: No   . Sexual activity: Not on file     Other Topics Concern   . Not on file     Social History Narrative   . No narrative on file     Active Hospital Problems    Diagnosis   . Chest pain in adult   . Acute on chronic congestive heart failure     Allergies   Allergen Reactions   . Lisinopril Anaphylaxis   . Morphine Anaphylaxis   . Toradol [Ketorolac Tromethamine]      GI Symptoms: WDL  Skin: intact    Current Meds:    amLODIPine 10 mg Daily   calcium carbonate 1,000 mg BID   carvedilol 25 mg Q12H [facility]   darbepoetin alfa 100 mcg Weekly   furosemide 40 mg BID   hydrALAZINE 100 mg TID   isosorbide dinitrate 10 mg TID   lidocaine 1 patch Q24H   pantoprazole 40 mg Daily   sevelamer 800 mg TID MEALS     Recent Labs:    Recent Labs  Lab 01/02/16  1044 12/30/15  1455   WBC 8.61 9.74   Hgb 7.5* 7.6*   Hematocrit 24.2* 24.0*   MCV 94.2 93.0   Platelets 116* 113*       Recent Labs  Lab 01/02/16  1044 12/30/15  1455    Sodium 144 141   Potassium 4.6 4.9   Chloride 104 104   CO2 23 21*   BUN 56* 69*   Creatinine 8.0* 7.4*   Glucose 108* 97   Calcium 5.5* 5.5*   Magnesium  --  1.8   Phosphorus 6.6* 6.1*   EGFR 8.9 9.7       Recent Labs  Lab 01/02/16  1044 12/30/15  1455   Albumin 2.6* 2.6*       Intake/Output Summary (Last 24 hours) at 01/03/16 1523  Last data filed at 01/03/16 0700   Gross per 24 hour   Intake  500 ml   Output                0 ml   Net              500 ml       Current Diet Order      Diet cardiac renal 80 GM Protein; 2 GM NA; 1800 ML FLUID    Anthropometrics  Height: 185.4 cm (6\' 1" )  Weight: 105.8 kg (233 lb 3.2 oz)  Weight Change: -2.35  IBW/kg (Calculated) Male: 83.67 kg  IBW/kg (Calculated) Male: 74.97 kg  BMI (calculated): 32.6 (Class I Obesity)    Wt Readings from Last 3 Encounters:   01/03/16 105.8 kg (233 lb 3.2 oz)   12/18/15 97.1 kg (214 lb 1.1 oz)   11/12/15 110.8 kg (244 lb 4.3 oz)     Estimated Nutrition Needs:  Estimated Energy Needs  Total Energy Estimated Needs: 2200-2400 kcals/day  Method for Estimating Needs: MSJ x 1.1-1.2    Estimated Protein Needs  Total Protein Estimated Needs: 85-115g Pro/day  Method for Estimating Needs: 0.8-1.1g Pro/kg    Estimated Carbohydrate Needs  Total Carbohydrate Estimated Needs: 290g CHO/day  Method for Estimating Needs: 50%    Fluid Needs  Total Fluid Estimated Needs: 2200-2400 mls/day  Method for Estimating Needs: 1 ml/kcal    Learning & Discharge Planning Needs: none  Religious/Cultural Food Practices: none    Nutrition Diagnosis:   No nutrition diagnosis    Intervention:  None at this time    Goals:  Pt will eat at least 75% of meals by next visit.    M/E:  Follow-up within 10 days.    Cheral Marker, RD, LDN x [phone]

## 2016-01-03 NOTE — Progress Notes (Signed)
CARDIOLOGY HOSPITAL VISIT PROGRESS NOTE  Date:  01/03/2016, 11:51 AM  Patient:  [name].  DOB:  08-12-1970.      HPI/ROS:    --- 45 yo with CM presents with CP, N/V and fever  --- denies CP.  SOB is "better"    ASSESSMENT and PLAN:  --- CM   Records from [facility]: cardiac cath July 2015 with minimal CAD    ECHO 12/18/15  EF 35%   (Counseled pt 9/14th to avoid Na)   No further cardiac workup is required at this point  --- HTN   BP improved, observe, may need further adjustment of meds and this can be done as an outpatient   --- noncompliance   (Counseled pt 9/15thregarding importance of not missing meds and dialysis)  --- Monitor independently reviewed: 3 beat WCT 7/16th (brief SVT and 3-4 beat WCT 7/12th)   asymptomatic  --- ESRD and anemia    STUDIES:  --- CXR  No active disease  12/30/2015    --- ECHO 12/18/15  EF 35%    PHYSICAL EXAM:   Vitals:    01/03/16 0845   BP: (!) 155/95   Pulse: 96   Resp: 20   Temp: (!) 96.9 F (36.1 C)   SpO2: 100%     >> General:   no distress,  obese,  >> Respiratory:   unlabored respiration,  no wheeze,  no crackles,  >> Cardiovascular:   regular rhythm,  No LE edema,  >> Gastrointestinal:   Abdomen soft and nontender,  >> Musculoskeletal:   normal muscle tone and strength upper extremities >> Skin:   warm and dry,  >> Neuro/Psych:   Alert,  Appropriate mood and affect,   .  SELECTED LABS:     Recent Labs  Lab 01/02/16  1044   WBC 8.61   Hgb 7.5*   Hematocrit 24.2*   Platelets 116*        Recent Labs  Lab 01/02/16  1044 12/30/15  1455   Sodium 144 141   Potassium 4.6 4.9   Chloride 104 104   CO2 23 21*   BUN 56* 69*   Creatinine 8.0* 7.4*   Magnesium  --  1.8           Recent Labs  Lab 12/31/15  0550 12/30/15  1455   Troponin I 0.07 0.08       INPATIENT MEDS AT BEGINNING OF VISIT:  INFUSION MEDS:     SCHEDULED MEDS:  Current Facility-Administered Medications   Medication Dose Route Frequency   . amLODIPine  10 mg Oral Daily   . calcium carbonate  1,000 mg Oral BID   .  carvedilol  25 mg Oral Q12H SCH   . darbepoetin alfa  100 mcg Subcutaneous Weekly   . furosemide  40 mg Intravenous BID   . hydrALAZINE  100 mg Oral TID   . isosorbide dinitrate  10 mg Oral TID   . lidocaine  1 patch Transdermal Q24H   . pantoprazole  40 mg Oral Daily   . sevelamer  800 mg Oral TID MEALS     PRN MEDS:  albuterol-ipratropium, HYDROmorphone, HYDROmorphone, nitroglycerin, ondansetron, sodium chloride, sodium chloride    Trinaty Bundrick Alyson Locket, MD [facility]  [facility]  [facility] [place] [facility] [place]  Tel [phone]

## 2016-01-03 NOTE — Plan of Care (Signed)
Problem: Pain  Goal: Pain at adequate level as identified by patient  Outcome: Adequate for Discharge  Pt is getting dilaudid every four hours.  Pt is on a managed schedule that is prn/as needed basis.    Pt is assessed frequently for pain levels and would say that is pain is improving but also still wants pain medication.    Pt is anuric, so pain medication stays in system a little longer.      Pt is restless and would do well with sleep aids.

## 2016-01-03 NOTE — Plan of Care (Signed)
Problem: Safety  Goal: Patient will be free from injury during hospitalization  Outcome: Progressing  Patient received in bed during shift change, encouraged to call for assist when needed for safety   01/03/16 0527   Goal/Interventions addressed this shift   Patient will be free from injury during hospitalization  Assess patient's risk for falls and implement fall prevention plan of care per policy;Provide and maintain safe environment;Use appropriate transfer methods;Ensure appropriate safety devices are available at the bedside;Include patient/ family/ care giver in decisions related to safety;Hourly rounding     Goal: Patient will be free from infection during hospitalization  Outcome: Progressing   01/03/16 0527   Goal/Interventions addressed this shift   Free from Infection during hospitalization Assess and monitor for signs and symptoms of infection;Monitor lab/diagnostic results       Problem: Pain  Goal: Pain at adequate level as identified by patient  Outcome: Progressing  Intermittently verbalized generalized pain during shift, PRN med given   01/03/16 0527   Goal/Interventions addressed this shift   Pain at adequate level as identified by patient Assess pain on admission, during daily assessment and/or before any "as needed" intervention(s);Reassess pain within 30-60 minutes of any procedure/intervention, per Pain Assessment, Intervention, Reassessment (AIR) Cycle;Include patient/patient care companion in decisions related to pain management as needed       Problem: Psychosocial and Spiritual Needs  Goal: Demonstrates ability to cope with hospitalization/illness  Outcome: Progressing   01/03/16 0527   Goal/Interventions addressed this shift   Demonstrates ability to cope with hospitalizations/illness Encourage verbalization of feelings/concerns/expectations;Provide quiet environment;Assist patient to identify own strengths and abilities;Include patient/ patient care companion in decisions       Problem:  Hemodynamic Status: Cardiac  Goal: Stable vital signs and fluid balance  Outcome: Progressing   01/03/16 0527   Goal/Interventions addressed this shift   Stable vital signs and fluid balance Monitor/assess vital signs and telemetry per unit protocol;Weigh on admission and record weight daily;Assess signs and symptoms associated with cardiac rhythm changes;Monitor lab values;Monitor for leg swelling/edema and report to LIP if abnormal       Problem: Renal Instability  Goal: Fluid and electrolyte balance are achieved/maintained  Outcome: Progressing   01/03/16 0527   Goal/Interventions addressed this shift   Fluid and electrolyte balance are achieved/maintained  Monitor/assess lab values and report abnormal values;Provide adequate hydration;Monitor daily weight;Assess for confusion/personality changes;Observe for cardiac arrhythmias;Monitor for muscle weakness     Goal: Nutritional intake is adequate  Outcome: Progressing   01/03/16 0527   Goal/Interventions addressed this shift   Nutritional intake is adequate Monitor daily weights;Allow adequate time for meals;Include patient/patient care companion in decisions related to nutrition     Goal: Perineal skin integrity is maintained or improved  Outcome: Progressing    Goal: Free from infection  Outcome: Progressing

## 2016-01-03 NOTE — Progress Note - Problem Oriented Charting Notewrit (Signed)
MEDICINE PROGRESS NOTE    Date Time: 01/03/16 5:22 PM  Patient Name: [name] D  Attending Physician: Mosie Epstein, MD  Hospital Days:4    Assessment:     Patient Active Problem List    Diagnosis Date Noted   . Chest pain in adult 12/30/2015   . Acute on chronic congestive heart failure 12/30/2015   . Drug-seeking behavior 12/18/2015   . HAP (hospital-acquired pneumonia) 12/17/2015   . Diarrhea 12/17/2015   . Hospital-acquired pneumonia    . Nausea & vomiting 11/09/2015   . Decreased appetite 11/09/2015   . Ascites 11/09/2015   . Diarrhea of infectious origin 11/09/2015   . Acute pain due to trauma 11/17/2013   . Pulmonary contusion, initial encounter 11/17/2013   . 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 Hypertension 11/17/2013           Plan:   Pack red blood cell transfusion for supportive care  Hypocalcemia supplemented,   Repeat Labs in the AM   Daily calcium supplementation   Oxygen titration wean off to room air   Serial cardiac enzymes negative for acute coronary syndrome    Bronchodilators   Control of agitation  PRN nitroglycerin  BP control with current medication regimen   Echo August 217 EF of 38%, acute systolic dysfunction, medical management   No need to repeat echo  Hyperphosphatemia of ESRD, phosphate binders   Diuresis pending nephrology evaluation for inpatient hemodialysis  ESRD, nephrology for inpatient HD  GERD on PPI  Antiemetics  Anxiolytics   Monitor electrolytes   Daily weight   Swollen left upper extremity.  We will obtain Doppler studies to rule out deep vein thrombosis  PT/OT evaluation and treatment   Code status: Full code  Possible [place]  In the AM if H/H stable             CC: chest pain and Arm pains        Interval History/24 hour events:  Sob improving   Denies headaches   Chest pain very atypical   Fatigue and Lethargic             Review of Systems:   General ROS: negative for - chills,  fever  Psychological ROS: negative for - anxiety ,  depression  Ophthalmic ROS: negative for - blurry vision or eye pain  ENT ROS: negative for - nasal congestion or oral lesions  Allergy and Immunology ROS: negative for - hives  Hematological and Lymphatic ROS: negative for - bleeding problems  Endocrine ROS: negative for - temperature intolerance  Respiratory ROS: cough, shortness of breath, or wheezing  Cardiovascular ROS: no chest pain or dyspnea on exertion  Gastrointestinal ROS: no abdominal pain, change in bowel habits, or black or bloody stools  Genito-Urinary ROS: no dysuria, trouble voiding, or hematuria  Musculoskeletal ROS: negative for - joint pain or joint swelling  Neurological ROS: negative for - numbness/tingling or weakness  Dermatological ROS: negative for rash      Physical Exam:     Patient Vitals for the past 24 hrs:   BP Temp Temp src Pulse Resp SpO2 Weight   01/03/16 1600 (!) 173/95 98.4 F (36.9 C) Oral 95 19 96 % -   01/03/16 1200 (!) 167/97 98.1 F (36.7 C) Oral 89 19 100 % -   01/03/16 0845 (!) 155/95 (!) 96.9 F (36.1 C) Oral 96 20 100 % -   01/03/16 0507 - - - - - -  105.8 kg (233 lb 3.2 oz)   01/03/16 0445 146/89 (!) 96.2 F (35.7 C) Oral 92 20 99 % -   01/03/16 0040 - - - - - 100 % -   01/02/16 2314 150/88 (!) 96.1 F (35.6 C) Oral 93 20 100 % -   01/02/16 2055 135/88 (!) 96.5 F (35.8 C) Oral 92 20 99 % -   01/02/16 1859 136/84 - - - - - -     Body mass index is 30.77 kg/m.    Intake/Output Summary (Last 24 hours) at 01/03/16 1722  Last data filed at 01/03/16 0700   Gross per 24 hour   Intake              500 ml   Output                0 ml   Net              500 ml       General: awake, alert, oriented x 3; no acute distress.  HEENT: perrla, eomi, sclera anicteric  oropharynx clear without lesions, mucous membranes moist  Neck: supple, no lymphadenopathy, no thyromegaly, no JVD, no carotid bruits  Cardiovascular: regular rate and rhythm, no murmurs, rubs or gallops  Lungs: clear to auscultation bilaterally, without wheezing,  rhonchi, or rales  Abdomen: soft, non-tender, non-distended; no palpable masses, no hepatosplenomegaly, normoactive bowel sounds, no rebound or guarding  Extremities: no clubbing, cyanosis, or edema. Bilateral swollen and tender upper extremities   Neuro: A+O x 3, cranial nerves grossly intact, strength 5/5 in upper and lower extremities, sensation intact,   Skin: no rashes or lesions noted  Meds:     Medications were reviewed:    Labs:     Recent Labs      01/02/16   1044   WBC  8.61   Hgb  7.5*   Hematocrit  24.2*   Platelets  116*   MCV  94.2       Recent Labs      01/02/16   1044   Sodium  144   Potassium  4.6   Chloride  104   CO2  23   BUN  56*   Creatinine  8.0*   Glucose  108*   Calcium  5.5*   Phosphorus  6.6*       Recent Labs      01/02/16   1044   Albumin  2.6*       No results for input(s): PTT, PT, INR in the last 72 hours.    Imaging personally reviewed          Discharge Date: Guarded            Reason to Stay: Fluid overload, Chest pain, bilateral upper extremity swelling and pain         Case discussed with: Lenoria Chime, MD  01/03/2016  5:22 PM

## 2016-01-04 LAB — PREPARE RBC
Expiration Date: 201710122359
Status: TRANSFUSED
UTYPE: O POS

## 2016-01-04 LAB — TYPE AND SCREEN
AB Screen Gel: NEGATIVE
ABO Rh: O POS

## 2016-01-04 MED ORDER — SODIUM CHLORIDE 0.9 % IV SOLN
INTRAVENOUS | Status: DC | PRN
Start: 2016-01-04 — End: 2016-01-05

## 2016-01-04 MED ORDER — ALBUTEROL SULFATE (2.5 MG/3ML) 0.083% IN NEBU
2.5000 mg | INHALATION_SOLUTION | RESPIRATORY_TRACT | Status: DC
Start: 2016-01-04 — End: 2016-01-05
  Administered 2016-01-04 – 2016-01-05 (×6): 2.5 mg via RESPIRATORY_TRACT
  Filled 2016-01-04 (×4): qty 3

## 2016-01-04 MED ORDER — MINOXIDIL 2.5 MG PO TABS
5.0000 mg | ORAL_TABLET | Freq: Every day | ORAL | Status: DC
Start: 2016-01-04 — End: 2016-01-05
  Administered 2016-01-04: 5 mg via ORAL
  Filled 2016-01-04 (×2): qty 2

## 2016-01-04 MED ORDER — ALBUTEROL-IPRATROPIUM 2.5-0.5 (3) MG/3ML IN SOLN
3.0000 mL | Freq: Four times a day (QID) | RESPIRATORY_TRACT | Status: DC
Start: 2016-01-04 — End: 2016-01-04

## 2016-01-04 NOTE — Progress Notes (Signed)
Type and screen in progress. MD aware.

## 2016-01-04 NOTE — Plan of Care (Signed)
Patient turned off IV pump. Waiting for blood bank to have blood ready.

## 2016-01-04 NOTE — Plan of Care (Signed)
Problem: Safety  Goal: Patient will be free from injury during hospitalization  Outcome: Progressing  Encouraged to call for assist when needed for safety   01/04/16 0332   Goal/Interventions addressed this shift   Patient will be free from injury during hospitalization  Assess patient's risk for falls and implement fall prevention plan of care per policy;Provide and maintain safe environment;Use appropriate transfer methods;Ensure appropriate safety devices are available at the bedside;Include patient/ family/ care giver in decisions related to safety;Hourly rounding     Goal: Patient will be free from infection during hospitalization  Outcome: Progressing   01/04/16 0332   Goal/Interventions addressed this shift   Free from Infection during hospitalization Assess and monitor for signs and symptoms of infection;Monitor lab/diagnostic results       Problem: Pain  Goal: Pain at adequate level as identified by patient  Outcome: Progressing  Intermittently verbalized generalized pain, PRN med given   01/04/16 1610   Goal/Interventions addressed this shift   Pain at adequate level as identified by patient Assess pain on admission, during daily assessment and/or before any "as needed" intervention(s);Reassess pain within 30-60 minutes of any procedure/intervention, per Pain Assessment, Intervention, Reassessment (AIR) Cycle       Problem: Psychosocial and Spiritual Needs  Goal: Demonstrates ability to cope with hospitalization/illness  Outcome: Progressing   01/04/16 0332   Goal/Interventions addressed this shift   Demonstrates ability to cope with hospitalizations/illness Provide quiet environment;Encourage verbalization of feelings/concerns/expectations;Include patient/ patient care companion in decisions       Problem: Hemodynamic Status: Cardiac  Goal: Stable vital signs and fluid balance  Outcome: Progressing   01/04/16 0332   Goal/Interventions addressed this shift   Stable vital signs and fluid balance  Monitor/assess vital signs and telemetry per unit protocol;Weigh on admission and record weight daily;Assess signs and symptoms associated with cardiac rhythm changes;Monitor lab values;Monitor for leg swelling/edema and report to LIP if abnormal       Problem: Chest Pain  Goal: Vital signs and cardiac rhythm stable  Outcome: Progressing   01/04/16 0332   Goal/Interventions addressed this shift   Vital signs and cardiac rhythm stable Monitor Earnest Bailey vital signs/cardiac rhythms;Monitor labs;Assess the need for oxygen therapy and administer as ordered       Problem: Renal Instability  Goal: Nutritional intake is adequate  Outcome: Progressing   01/04/16 0332   Goal/Interventions addressed this shift   Nutritional intake is adequate Include patient/patient care companion in decisions related to nutrition;Monitor daily weights     Goal: Free from infection  Outcome: Progressing   01/04/16 0332   Goal/Interventions addressed this shift   Free from infection Monitor/assess for signs and symptoms of infection

## 2016-01-04 NOTE — Progress Notes (Addendum)
CARDIOLOGY HOSPITAL VISIT PROGRESS NOTE  Date:  01/04/2016, 12:59 PM  Patient:  [name].  DOB:  05/06/1970.      HPI/ROS:    --- 45 yo with CM presents with CP, N/V and fever  --- denies CP.  SOB is "better"    ASSESSMENT and PLAN:  --- CM   Records from [facility]: cardiac cath July 2015 with minimal CAD    ECHO 12/18/15  EF 35%   (Counseled pt 9/14th to avoid Na)   No further cardiac workup is required at this point  --- HTN   BP improved, observe, increase   Add minoxidil   (reported anaphylaxis to lisinopril)  --- noncompliance   (Counseled pt 9/15thregarding importance of not missing meds and dialysis)  --- Monitor independently reviewed: 3 beat WCT 7/16th (brief SVT and 3-4 beat WCT 7/12th)  --- ESRD and anemia  --- OSA   Pt not on CPAP now and will need evaluation as oupatient     STUDIES:  --- CXR  No active disease  12/30/2015    --- ECHO 12/18/15  EF 35%    PHYSICAL EXAM:   Vitals:    01/04/16 1159   BP: (!) 169/98   Pulse: 88   Resp: 19   Temp: (!) 96.4 F (35.8 C)   SpO2: 100%     >> General:   no distress,  obese,  >> Respiratory:   unlabored respiration,  no crackles,  >> Cardiovascular:   regular rhythm,  No LE edema,  >> Gastrointestinal:   Abdomen soft and nontender,  >> Musculoskeletal:   normal muscle tone and strength upper extremities >> Skin:   warm and dry,  >> Neuro/Psych:   Alert,  Appropriate mood and affect,   .  SELECTED LABS:     Recent Labs  Lab 01/02/16  1044   WBC 8.61   Hgb 7.5*   Hematocrit 24.2*   Platelets 116*        Recent Labs  Lab 01/02/16  1044 12/30/15  1455   Sodium 144 141   Potassium 4.6 4.9   Chloride 104 104   CO2 23 21*   BUN 56* 69*   Creatinine 8.0* 7.4*   Magnesium  --  1.8           Recent Labs  Lab 12/31/15  0550 12/30/15  1455   Troponin I 0.07 0.08       INPATIENT MEDS AT BEGINNING OF VISIT:  INFUSION MEDS:     SCHEDULED MEDS:  Current Facility-Administered Medications   Medication Dose Route Frequency   . amLODIPine  10 mg Oral Daily   . calcium  carbonate  1,000 mg Oral BID   . carvedilol  25 mg Oral Q12H SCH   . darbepoetin alfa  100 mcg Subcutaneous Weekly   . furosemide  40 mg Intravenous BID   . hydrALAZINE  100 mg Oral TID   . isosorbide dinitrate  10 mg Oral TID   . lidocaine  1 patch Transdermal Q24H   . pantoprazole  40 mg Oral Daily   . sevelamer  800 mg Oral TID MEALS     PRN MEDS:  albuterol-ipratropium, HYDROmorphone, HYDROmorphone, nitroglycerin, ondansetron    Troyce Gieske Alyson Locket, MD [facility]  [facility]  [facility] [place] - [address]  Tel [phone]

## 2016-01-04 NOTE — Progress Notes (Signed)
First unit of PRBC infusing without adverse reaction noted. Will continue to monitor. Report given to oncoming nurse.

## 2016-01-04 NOTE — Progress Note - Problem Oriented Charting Notewrit (Signed)
MEDICINE PROGRESS NOTE    Date Time: 01/04/16 3:24 PM  Patient Name: [name]  Attending Physician: Mosie Epstein, MD  Hospital Days:5    Assessment:     Patient Active Problem List    Diagnosis Date Noted   . Chest pain in adult 12/30/2015   . Acute on chronic congestive heart failure 12/30/2015   . Drug-seeking behavior 12/18/2015   . HAP (hospital-acquired pneumonia) 12/17/2015   . Diarrhea 12/17/2015   . Hospital-acquired pneumonia    . Nausea & vomiting 11/09/2015   . Decreased appetite 11/09/2015   . Ascites 11/09/2015   . Diarrhea of infectious origin 11/09/2015   . Acute pain due to trauma 11/17/2013   . Pulmonary contusion, initial encounter 11/17/2013   . 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 Hypertension 11/17/2013           Plan:   Hold  patient symptomatic foe low h/h   Pack red blood cell transfusion for supportive care  Hypocalcemia supplemented follow up repeat labs in the AM   Repeat Labs in the AM   Daily calcium supplementation   Oxygen titration wean off to room air   Bronchodilators  Control of agitation  PRN nitroglycerin  BP control with current medication regimen   Echo August 217 EF of 38%, acute systolic dysfunction, medical management   Hyperphosphatemia of ESRD, phosphate binders   Diuresis pending nephrology evaluation for inpatient hemodialysis  ESRD, nephrology for inpatient HD  GERD on PPI  Antiemetics  Anxiolytic  Daily weight   Swollen left upper extremity, Doppler studies old chronic venous changes, monitor   PT/OT evaluation and treatment  Code status: Full code              CC: chest pain and Arm pains        Interval History/24 hour events:  Sob improving   Denies headaches   Chest pain very atypical   Fatigue and Lethargic             Review of Systems:   General ROS: negative for - chills,  fever  Psychological ROS: negative for - anxiety , depression  Ophthalmic ROS: negative for - blurry vision or eye pain  ENT ROS: negative for - nasal  congestion or oral lesions  Allergy and Immunology ROS: negative for - hives  Hematological and Lymphatic ROS: negative for - bleeding problems  Endocrine ROS: negative for - temperature intolerance  Respiratory ROS: cough, shortness of breath, or wheezing  Cardiovascular ROS: no chest pain or dyspnea on exertion  Gastrointestinal ROS: no abdominal pain, change in bowel habits, or black or bloody stools  Genito-Urinary ROS: no dysuria, trouble voiding, or hematuria  Musculoskeletal ROS: negative for - joint pain or joint swelling  Neurological ROS: negative for - numbness/tingling or weakness  Dermatological ROS: negative for rash      Physical Exam:     Patient Vitals for the past 24 hrs:   BP Temp Temp src Pulse Resp SpO2 Weight   01/04/16 1159 (!) 169/98 (!) 96.4 F (35.8 C) Oral 88 19 100 % -   01/04/16 0900 176/90 97.5 F (36.4 C) Oral - 18 - -   01/04/16 0417 (!) 168/95 (!) 96.4 F (35.8 C) Oral 94 18 100 % 106.1 kg (233 lb 14.4 oz)   01/03/16 2357 (!) 161/93 97.2 F (36.2 C) Oral (!) 101 18 99 % -   01/03/16 2121 - - - - -  100 % -   01/03/16 2026 - - - 97 - - -   01/03/16 2015 (!) 172/93 97 F (36.1 C) Oral 95 20 100 % -   01/03/16 1600 (!) 173/95 98.4 F (36.9 C) Oral 95 19 96 % -     Body mass index is 30.86 kg/m.  No intake or output data in the 24 hours ending 01/04/16 1524    General: awake, alert, oriented x 3; no acute distress.  HEENT: perrla, eomi, sclera anicteric  oropharynx clear without lesions, mucous membranes moist  Neck: supple, no lymphadenopathy, no thyromegaly, no JVD, no carotid bruits  Cardiovascular: regular rate and rhythm, no murmurs, rubs or gallops  Lungs: clear to auscultation bilaterally, without wheezing, rhonchi, or rales  Abdomen: soft, non-tender, non-distended; no palpable masses, no hepatosplenomegaly, normoactive bowel sounds, no rebound or guarding  Extremities: no clubbing, cyanosis, or edema. Bilateral swollen and tender upper extremities   Neuro: A+O x 3, cranial  nerves grossly intact, strength 5/5 in upper and lower extremities, sensation intact,   Skin: no rashes or lesions noted  Meds:     Medications were reviewed:    Labs:     Recent Labs      01/02/16   1044   WBC  8.61   Hgb  7.5*   Hematocrit  24.2*   Platelets  116*   MCV  94.2       Recent Labs      01/02/16   1044   Sodium  144   Potassium  4.6   Chloride  104   CO2  23   BUN  56*   Creatinine  8.0*   Glucose  108*   Calcium  5.5*   Phosphorus  6.6*       Recent Labs      01/02/16   1044   Albumin  2.6*       No results for input(s): PTT, PT, INR in the last 72 hours.    Imaging personally reviewed          Discharge Date: Guarded            Reason to Stay: Fluid overload, Chest pain, bilateral upper extremity swelling and pain         Case discussed with: Lenoria Chime, MD  01/04/2016  3:24 PM

## 2016-01-04 NOTE — Plan of Care (Signed)
Problem: Safety  Goal: Patient will be free from injury during hospitalization  Outcome: Progressing      Problem: Pain  Goal: Pain at adequate level as identified by patient  Outcome: Progressing   01/04/16 1307   Goal/Interventions addressed this shift   Pain at adequate level as identified by patient Identify patient comfort function goal;Assess for risk of opioid induced respiratory depression, including snoring/sleep apnea. Alert healthcare team of risk factors identified.;Assess pain on admission, during daily assessment and/or before any "as needed" intervention(s);Reassess pain within 30-60 minutes of any procedure/intervention, per Pain Assessment, Intervention, Reassessment (AIR) Cycle       Problem: Side Effects from Pain Analgesia  Goal: Patient will experience minimal side effects of analgesic therapy  Outcome: Progressing      Problem: Discharge Barriers  Goal: Patient will be discharged home or other facility with appropriate resources  Outcome: Progressing   01/04/16 1307   Goal/Interventions addressed this shift   Discharge to home or other facility with appropriate resources Provide appropriate patient education;Initiate discharge planning       Problem: Psychosocial and Spiritual Needs  Goal: Demonstrates ability to cope with hospitalization/illness  Outcome: Progressing      Problem: Chest Pain  Goal: Vital signs and cardiac rhythm stable  Outcome: Progressing      Problem: Renal Instability  Goal: Perineal skin integrity is maintained or improved  Outcome: Adequate for Discharge  Patient self care.   01/04/16 1307   Goal/Interventions addressed this shift   Perineal skin integrity is maintained or improved Keep intact skin clean and dry;Apply urinary containment device as appropriate and/or per order

## 2016-01-05 ENCOUNTER — Inpatient Hospital Stay: Payer: Medicare Other

## 2016-01-05 ENCOUNTER — Other Ambulatory Visit: Payer: Self-pay

## 2016-01-05 DIAGNOSIS — N186 End stage renal disease: Secondary | ICD-10-CM

## 2016-01-05 DIAGNOSIS — I160 Hypertensive urgency: Secondary | ICD-10-CM

## 2016-01-05 DIAGNOSIS — I2510 Atherosclerotic heart disease of native coronary artery without angina pectoris: Secondary | ICD-10-CM

## 2016-01-05 DIAGNOSIS — G4733 Obstructive sleep apnea (adult) (pediatric): Secondary | ICD-10-CM

## 2016-01-05 LAB — BASIC METABOLIC PANEL
Anion Gap: 15 (ref 5.0–15.0)
BUN: 63 mg/dL — ABNORMAL HIGH (ref 9–28)
CO2: 23 mEq/L (ref 22–29)
Calcium: 6.4 mg/dL — ABNORMAL LOW (ref 8.5–10.5)
Chloride: 103 mEq/L (ref 100–111)
Creatinine: 9.2 mg/dL — ABNORMAL HIGH (ref 0.7–1.3)
Glucose: 91 mg/dL (ref 70–100)
Potassium: 5.9 mEq/L — ABNORMAL HIGH (ref 3.5–5.1)
Sodium: 141 mEq/L (ref 136–145)

## 2016-01-05 LAB — CBC
Absolute NRBC: 0.04 10*3/uL — ABNORMAL HIGH
Hematocrit: 27.6 % — ABNORMAL LOW (ref 42.0–52.0)
Hgb: 8.7 g/dL — ABNORMAL LOW (ref 13.0–17.0)
MCH: 29.7 pg (ref 28.0–32.0)
MCHC: 31.5 g/dL — ABNORMAL LOW (ref 32.0–36.0)
MCV: 94.2 fL (ref 80.0–100.0)
MPV: 10.3 fL (ref 9.4–12.3)
Nucleated RBC: 0.5 /100 WBC (ref 0.0–1.0)
Platelets: 114 10*3/uL — ABNORMAL LOW (ref 140–400)
RBC: 2.93 10*6/uL — ABNORMAL LOW (ref 4.70–6.00)
RDW: 17 % — ABNORMAL HIGH (ref 12–15)
WBC: 8.45 10*3/uL (ref 3.50–10.80)

## 2016-01-05 LAB — GFR: EGFR: 7.5

## 2016-01-05 LAB — MAGNESIUM: Magnesium: 2.1 mg/dL (ref 1.6–2.6)

## 2016-01-05 LAB — PHOSPHORUS: Phosphorus: 7.6 mg/dL — ABNORMAL HIGH (ref 2.3–4.7)

## 2016-01-05 MED ORDER — HYDROMORPHONE HCL 4 MG PO TABS
4.0000 mg | ORAL_TABLET | ORAL | Status: DC | PRN
Start: 2016-01-05 — End: 2016-01-05
  Filled 2016-01-05: qty 1

## 2016-01-05 MED ORDER — MINOXIDIL 2.5 MG PO TABS
5.0000 mg | ORAL_TABLET | Freq: Two times a day (BID) | ORAL | Status: DC
Start: 2016-01-05 — End: 2016-01-05

## 2016-01-05 NOTE — Plan of Care (Signed)
Patient schedule for HD at 09:45. HD Rn recommends patient to take coreg before HD. Patient decline, BP 166/97. Patient denies H/A.  Ate 100 % of breakfast. Patient will take his routine medications after HD.

## 2016-01-05 NOTE — Progress Notes (Addendum)
THIS WRITER SPOKE W/ PT ABOUT PENDING DISCHARGE FOR 01/06/16. PT REFUSES TO RETURN TO [name],  [place] STATING THEY "DO NOT SEE EYE-TO-EYE."     PT NOW ACTIVE W/ [facility] IN [place], [place]. THIS WRITER CALLED TO VERIFY MEMBERSHIP  [phone]. MEMBERSHIP VERIFIED W/ VANESSA OF [facility]. PT AWARE HE IS TO LOOK FOR A CALLBACK FROM CASE WORKER AFTER DISCHARGE FROM [facility]. NO CASE WORKER WILL BE ASSIGNED UNTIL THE DAY OF DISCHARGE. NO FURTHER CASE MANAGEMENT NEEDS AT THIS TIME.

## 2016-01-05 NOTE — Plan of Care (Signed)
Problem: Renal Instability  Goal: Fluid and electrolyte balance are achieved/maintained  Outcome: Progressing  HD nurse called to request coreg to be given for a BP 188/100. At first, patient stated, "I will take all my medicine after HD." We explained the benefit of the medicine and he agreed. However, he was not very happy. Will continue to monitor BP.

## 2016-01-05 NOTE — Progress Notes (Signed)
01/05/16 1130   Vitals   Heart Rate 92   Resp Rate 18   BP (!) 188/100   Machine Metrics   Blood Flow Rate (mL/min) 350 mL/min   Arterial Pressure (mmHg) -170 mmHg   Venous Pressure (mmHg) 153   Dialysate Flow Rate (mL/min) 700 mL/min   Transmembrane Pressure (mmHg) 70 mmHg   Ultrafiltration Rate (mL/Hr) 1000 mL/hr   Fluid Removal (ml) 1494 ml   Fluid Bolus (ml) 0 ml   Hemodialysis Comments   Arteriovenous Lines Secure Yes   Pt. made aware of elevated BP, refusing BP at this time. Patient education provided about the risks of having elevated BP.  Primary nurse notified.

## 2016-01-05 NOTE — Progress Notes (Signed)
[facility], [employer].  Progress Note    Assessment:   ESRD on HD MWF at [facility]. malfunctioning LUE AVF per patient. Using Lt sided TDC.    Chest pain   Hx of HTN with CKD   Anemia of CKD.   Hypocalcemia   Hx of non-compliance, routinely cuts short dialysis treatment times, under-dialysis     Plan:   Extra trt tomorrow for UF   Increase minoxidil to 5mg  BID   Add prn PO hydralazine   HD MWF; is cutting treatments short due to cramping, probably needs 4x a week    Dione Housekeeper, MD  Office [phone]    ++++++++++++++++++++++++++++++++++++++++++++++++++++++++++++++  Subjective:  No new issues.    Medications:  Scheduled Meds:  Current Facility-Administered Medications   Medication Dose Route Frequency   . albuterol  2.5 mg Nebulization Q4H SCH   . amLODIPine  10 mg Oral Daily   . calcium carbonate  1,000 mg Oral BID   . carvedilol  25 mg Oral Q12H SCH   . darbepoetin alfa  100 mcg Subcutaneous Weekly   . furosemide  40 mg Intravenous BID   . hydrALAZINE  100 mg Oral TID   . isosorbide dinitrate  10 mg Oral TID   . lidocaine  1 patch Transdermal Q24H   . minoxidil  5 mg Oral Daily   . pantoprazole  40 mg Oral Daily   . sevelamer  800 mg Oral TID MEALS     Continuous Infusions:   PRN Meds:sodium chloride, HYDROmorphone, HYDROmorphone, nitroglycerin, ondansetron    Objective:  Vital signs in last 24 hours:  Temp:  [96 F (35.6 C)-97.7 F (36.5 C)] 97.1 F (36.2 C)  Heart Rate:  [83-98] 92  Resp Rate:  [18-20] 18  BP: (158-200)/(91-106) 188/100  Intake/Output last 24 hours:    Intake/Output Summary (Last 24 hours) at 01/05/16 1148  Last data filed at 01/04/16 2240   Gross per 24 hour   Intake              300 ml   Output                1 ml   Net              299 ml     Intake/Output this shift:  No intake/output data recorded.      Labs:    Recent Labs  Lab 01/05/16  0544 01/02/16  1044 12/30/15  1455   Glucose 91 108* 97   BUN 63* 56* 69*   Creatinine 9.2* 8.0*  7.4*   Calcium 6.4* 5.5* 5.5*   Sodium 141 144 141   Potassium 5.9* 4.6 4.9   Chloride 103 104 104   CO2 23 23 21*   Albumin  --  2.6* 2.6*   Phosphorus 7.6* 6.6* 6.1*   Magnesium 2.1  --  1.8       Recent Labs  Lab 01/05/16  0543 01/02/16  1044 12/30/15  1455   WBC 8.45 8.61 9.74   Hgb 8.7* 7.5* 7.6*   Hematocrit 27.6* 24.2* 24.0*   MCV 94.2 94.2 93.0   MCH 29.7 29.2 29.5   MCHC 31.5* 31.0* 31.7*   RDW 17* 17* 16*   MPV 10.3 10.7 10.5   Platelets 114* 116* 113*       Gen: NAD  CVS: Normal s1 and s2  Chest: decr BS @ bases  Abdo: soft  Ext: +  edema

## 2016-01-05 NOTE — Progress Notes (Signed)
01/05/16 0950   Bedside Nurse Communication   Name of bedside RN - pre dialysis Marlise Eves RN   Treatment Initiation- With Dialysis Precautions   Time Out/Safety Check Completed Yes   Consent for HD signed for this hospitalization Y   Blood Consent Verified N/A   Dialysis Precautions All Connections Secured;Saline Line Double Clamped;Venous Parameters Set;Arterial Parameters Set;Air Foam Detecctor Engaged   Dialysis Treatment Type Routine   Is patient diabetic? No   RO/Hemodialysis Cabin crew   Is Total Chlorine less than 0.1 ppm? Yes   Orignial Total Chlorine Testing Time 0930   RO/Hemodialysis Dance movement psychotherapist Number 2   RO # [id number]   Water Hardness 0   pH 7.4   Pressure Test Verified Yes   Alarms Verified Passed   Machine Temperature 95.9 F (35.5 C)   Alarms Verified Yes   Na+ mEq (Machine) 138 mEq   Bicarb mEq (Machine) 35 mEq   Hemodialysis Conductivity (Machine) 13.8   Hemodialysis Conductivity (Meter) 13.8   Dialyzer Lot Number ([id number] 4/30/202)   Tubing Lot Number ([id number] 07/18/2018)   RO Machine Log Completed Yes   Hepatitis Status   HBsAg (Antigen) Result Negative   HBsAg Date Drawn 12/17/15   HBsAg Repeat Draw Due Date 01/16/17   Vitals   Temp 97.1 F (36.2 C)   Heart Rate 86   Resp Rate 18   BP (!) 158/91   Assessment   Mental Status Alert;Oriented;Cooperative   Cardiac (WDL) WDL   Cardiac Regularity Regular   Respiratory Pattern Irregular;Dyspnea with exertion   Bilateral Breath Sounds Diminished   R Breath Sounds Diminished   L Breath Sounds Diminished   Cough Non-productive   Facial None   Sacral UTA   RLE Edema +1   LLE Edema +1   General Skin Color Appropriate for ethnicity   Skin Condition/Temp Warm;Dry   Gastrointestinal (WDL) WDL   GI Symptoms None   Mobility Bed   Permacath/Temporary Catheter Permacath Left   No Placement Date or Time found.   Present on Admission?: Yes  Access Type: Permacath  Orientation: Left  Securement Method: Sutured;Transparent  Dressing  Antimicrobial disc applied correctly: No  If antimicrobial disc not used, why?: (c) Other (comme...   Dressing Status and Intervention Clean & Dry   Pain Assessment   Charting Type Assessment   Pain Scale Used Numeric Scale (0-10)   Numeric Pain Scale   Pain Score 0   POSS Score 1   Hemodialysis Comments   Pre-Hemodialysis Comments (Patient idenfied via wrist band.)

## 2016-01-05 NOTE — Progress Notes (Signed)
Pt has been informed that IV pain medication has been changed to PO pain medication. Pt states that PO pain medication takes too long to work and would like to leave against medical advice. MD has been notified and stated that pt's pain medication orders would not be changed. It was explained to pt that the medication had been changed to PO, in order to prepare him for discharge. Pt states that he understands why the MD changed the orders and that by him leaving against medical advance, his insurance coverage for his visit may be effected. AMA form was signed by pt, IV and telemetry were removed with no complications, and pt left unit via wheelchair with medical staff. Pt states he has a vehicle in the parking lot of the hospital.

## 2016-01-05 NOTE — Progress Note - Problem Oriented Charting Notewrit (Addendum)
MEDICINE PROGRESS NOTE    Date Time: 01/05/16 4:55 PM  Patient Name: [name]  Attending Physician: Mosie Epstein, MD  Hospital Days:6    Assessment:     Patient Active Problem List    Diagnosis Date Noted   . Chest pain in adult 12/30/2015   . Acute on chronic congestive heart failure 12/30/2015   . Drug-seeking behavior 12/18/2015   . HAP (hospital-acquired pneumonia) 12/17/2015   . Diarrhea 12/17/2015   . Hospital-acquired pneumonia    . Nausea & vomiting 11/09/2015   . Decreased appetite 11/09/2015   . Ascites 11/09/2015   . Diarrhea of infectious origin 11/09/2015   . Acute pain due to trauma 11/17/2013   . Pulmonary contusion, initial encounter 11/17/2013   . 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 Hypertension 11/17/2013           Plan:   Per case manager dialysis center set for patient hence [facility] set for tomorrow   [place] iv pain medications   PO hydromorphone   S/p pack red blood cell transfusion, no complications   Check postransfusion H/H   Hypocalcemia supplemented with IV calcium  Continue with daily supplemental PO calcium   Repeat Labs in the AM   Daily calcium supplementation   Oxygen titration wean off to room air   Bronchodilators  Out of bed to chair   Ambulate as tolerated   PRN nitroglycerin  BP control with current medication regimen   Echo August 217 EF of 38%, acute systolic dysfunction, medical management   Hyperphosphatemia of ESRD, phosphate binders   Diuresis pending nephrology evaluation for inpatient hemodialysis  ESRD, nephrology for inpatient HD  GERD on PPI  Antiemetics  Anxiolytic  Daily weight   Swollen left upper extremity, Doppler studies old chronic venous changes, monitor   PT/OT evaluation and treatment  Ultrasound marginal ascetic fluid, close monitoring   Code status: Full code              CC: chest pain and Arm pains        Interval History/24 hour events:  Sob improving   Denies headaches   Chest pain very atypical   Fatigue and Lethargic              Review of Systems:   General ROS: negative for - chills,  fever  Psychological ROS: negative for - anxiety , depression  Ophthalmic ROS: negative for - blurry vision or eye pain  ENT ROS: negative for - nasal congestion or oral lesions  Allergy and Immunology ROS: negative for - hives  Hematological and Lymphatic ROS: negative for - bleeding problems  Endocrine ROS: negative for - temperature intolerance  Respiratory ROS: cough, shortness of breath, or wheezing  Cardiovascular ROS: no chest pain or dyspnea on exertion  Gastrointestinal ROS: no abdominal pain, change in bowel habits, or black or bloody stools  Genito-Urinary ROS: no dysuria, trouble voiding, or hematuria  Musculoskeletal ROS: negative for - joint pain or joint swelling  Neurological ROS: negative for - numbness/tingling or weakness  Dermatological ROS: negative for rash      Physical Exam:     Patient Vitals for the past 24 hrs:   BP Temp Temp src Pulse Resp SpO2 Weight   01/05/16 1305 (!) 170/98 97.1 F (36.2 C) - 93 18 - -   01/05/16 1230 171/83 - - 93 18 - -   01/05/16 1200 (!) 172/104 - - 95 16 - -  01/05/16 1130 (!) 188/100 - - 92 18 - -   01/05/16 1100 (!) 196/94 - - 96 18 - -   01/05/16 1030 (!) 188/103 - - 92 18 - -   01/05/16 0958 (!) 200/106 - - 86 18 - -   01/05/16 0950 (!) 158/91 97.1 F (36.2 C) - 86 18 - -   01/05/16 0722 (!) 166/97 - - - 18 99 % -   01/05/16 0716 - - - 89 - - -   01/05/16 0616 - - - - - - 107.9 kg (237 lb 14.4 oz)   01/05/16 0424 (!) 173/97 (!) 96 F (35.6 C) Axillary 91 20 99 % -   01/05/16 0048 (!) 179/95 97.4 F (36.3 C) Axillary 98 20 99 % -   01/04/16 2240 (!) 192/98 (!) 96.1 F (35.6 C) Axillary 96 20 99 % -   01/04/16 2145 (!) 174/99 (!) 96.7 F (35.9 C) Axillary 94 20 99 % -   01/04/16 2045 (!) 167/99 (!) 96.4 F (35.8 C) Axillary 97 20 99 % -   01/04/16 2006 - - - 95 - - -   01/04/16 1946 - - - - - 100 % -   01/04/16 1915 (!) 170/92 (!) 96.8 F (36 C) Axillary 92 20 100 % -   01/04/16 1857  (!) 163/95 (!) 96.7 F (35.9 C) Oral 95 18 100 % -   01/04/16 1806 (!) 162/96 97.7 F (36.5 C) - 85 19 - -   01/04/16 1721 (!) 162/96 97.7 F (36.5 C) Oral 83 19 100 % -     Body mass index is 31.39 kg/m.    Intake/Output Summary (Last 24 hours) at 01/05/16 1655  Last data filed at 01/05/16 1305   Gross per 24 hour   Intake              300 ml   Output             2500 ml   Net            -2200 ml       General: awake, alert, oriented x 3; no acute distress.  HEENT: perrla, eomi, sclera anicteric  oropharynx clear without lesions, mucous membranes moist  Neck: supple, no lymphadenopathy, no thyromegaly, no JVD, no carotid bruits  Cardiovascular: regular rate and rhythm, no murmurs, rubs or gallops  Lungs: clear to auscultation bilaterally, without wheezing, rhonchi, or rales  Abdomen: soft, non-tender, non-distended; no palpable masses, no hepatosplenomegaly, normoactive bowel sounds, no rebound or guarding  Extremities: no clubbing, cyanosis, or edema. Bilateral swollen and tender upper extremities   Neuro: A+O x 3, cranial nerves grossly intact, strength 5/5 in upper and lower extremities, sensation intact,   Skin: no rashes or lesions noted  Meds:     Medications were reviewed:    Labs:     Recent Labs      01/05/16   0543   WBC  8.45   Hgb  8.7*   Hematocrit  27.6*   Platelets  114*   MCV  94.2       Recent Labs      01/05/16   0544   Sodium  141   Potassium  5.9*   Chloride  103   CO2  23   BUN  63*   Creatinine  9.2*   Glucose  91   Calcium  6.4*   Magnesium  2.1   Phosphorus  7.6*  No results for input(s): AST, ALT, ALKPHOS, PROT, ALB in the last 72 hours.    No results for input(s): PTT, PT, INR in the last 72 hours.    Imaging personally reviewed          Discharge Date: Guarded            Reason to Stay: Fluid overload, Chest pain, bilateral upper extremity swelling and pain         Case discussed with: Lenoria Chime, MD  01/05/2016  4:55 PM

## 2016-01-05 NOTE — Plan of Care (Signed)
Problem: Safety  Goal: Patient will be free from injury during hospitalization  Outcome: Progressing  Patient received with blood transfusion ongoing during shift change. Patient encouraged to call for assist when needed for safety. Blood transfusion tolerated well   01/05/16 0514   Goal/Interventions addressed this shift   Patient will be free from injury during hospitalization  Assess patient's risk for falls and implement fall prevention plan of care per policy;Provide and maintain safe environment;Use appropriate transfer methods;Ensure appropriate safety devices are available at the bedside;Include patient/ family/ care giver in decisions related to safety;Hourly rounding     Goal: Patient will be free from infection during hospitalization  Outcome: Progressing   01/05/16 0514   Goal/Interventions addressed this shift   Free from Infection during hospitalization Assess and monitor for signs and symptoms of infection;Monitor lab/diagnostic results       Problem: Pain  Goal: Pain at adequate level as identified by patient  Outcome: Progressing  PRN for generalized pain given   01/05/16 0514   Goal/Interventions addressed this shift   Pain at adequate level as identified by patient Assess pain on admission, during daily assessment and/or before any "as needed" intervention(s);Reassess pain within 30-60 minutes of any procedure/intervention, per Pain Assessment, Intervention, Reassessment (AIR) Cycle       Problem: Psychosocial and Spiritual Needs  Goal: Demonstrates ability to cope with hospitalization/illness  Outcome: Progressing   01/05/16 0514   Goal/Interventions addressed this shift   Demonstrates ability to cope with hospitalizations/illness Encourage verbalization of feelings/concerns/expectations;Provide quiet environment;Include patient/ patient care companion in decisions       Problem: Hemodynamic Status: Cardiac  Goal: Stable vital signs and fluid balance  Outcome: Progressing  SBP noted to be at the  160's-170's and DBP at 99 during shift   01/05/16 0514   Goal/Interventions addressed this shift   Stable vital signs and fluid balance Monitor/assess vital signs and telemetry per unit protocol;Monitor lab values;Monitor intake/output per unit protocol and/or LIP order

## 2016-01-05 NOTE — Progress Notes (Signed)
Patient alert and oriented x 4.  Time out done. Denied any pain.  No chest pain, no SOB noted and patient not in distress.  Has a left functional permacath with both lumens working. Dialysis initiated using this access without problem. Will attempt to remove net fluid as ordered.        01/05/16 0958   Permacath/Temporary Catheter Permacath Left   No Placement Date or Time found.   Present on Admission?: Yes  Access Type: Permacath  Orientation: Left  Securement Method: Sutured;Transparent Dressing  Antimicrobial disc applied correctly: No  If antimicrobial disc not used, why?: (c) Other (comme...   Line necessity reviewed? Dialysis   Catheter Lumen Volume Venous 1.9 mL   Catheter Lumen Volume Arterial 1.8 mL   Dressing Status and Intervention Clean & Dry   Tego Caps on Catheter Yes   NEW Tego Caps placed (Date) 01/03/16   End Caps Free From Blood Yes   Line Care Connections checked and tightened   Dressing Type Transparent   Dressing Status Clean;Dry;Intact   Dressing/Line Intervention Dressing reinforced   Biopatch Used? ([facility] Only) Yes   Dressing Change Due 01/10/16   Vitals   Heart Rate 86   Resp Rate 18   BP (!) 200/106   Animal nutritionist Yes   Blood Flow Rate (mL/min) 350 mL/min   Arterial Pressure (mmHg) -160 mmHg   Venous Pressure (mmHg) 170   Dialysate Flow Rate (mL/min) 700 mL/min   Transmembrane Pressure (mmHg) 60 mmHg   Ultrafiltration Rate (mL/Hr) 1000 mL/hr   Fluid Removal (ml) 0 ml   Fluid Bolus (ml) 200 ml   Dialysate K (mEq/L) 2 mEq/L   Dialysate CA (mEq/L) 2.5 mEq/L   Hemodialysis Comments   Arteriovenous Lines Secure Yes

## 2016-01-05 NOTE — Progress Notes (Signed)
Patient demanded to be taken off the machine 30 minutes earlier.  Patient education given regarding dialysis compliance.  Nephrologist here and aware.  Will do extra treatment tomorrow.  Patient claimed cramping on the lower extremities why he asked to be taken off early.  No chest pain, no SOB noted. Not in any distress at this time. Able to remove net fluid of 2.5 L.  Report given to his primary nurse.           01/05/16 1305   Treatment Summary   Time Off Machine 1305   Duration of Treatment (Hours) 3   Dialyzer Clearance Moderately streaked   Fluid Volume Off (mL) 3000   Prime Volume (mL) 200   Rinseback Volume (mL) 300   Fluid Given: Normal Saline (mL) 0   Fluid Given: PRBC  0 mL   Fluid Given: Albumin (mL) 0   Fluid Given: Other (mL) 0   Total Fluid Given 500   Hemodialysis Net Fluid Removed 2500   Post Treatment Assessment   Patient Response to Treatment (Pt asked to be taken off 30 mins earlier.)   Additional Dialyzer Used 0   Permacath/Temporary Catheter Permacath Left   No Placement Date or Time found.   Present on Admission?: Yes  Access Type: Permacath  Orientation: Left  Securement Method: Sutured;Transparent Dressing  Antimicrobial disc applied correctly: No  If antimicrobial disc not used, why?: (c) Other (comme...   Catheter Lumen Volume Venous 1.9 mL   Catheter Lumen Volume Arterial 1.8 mL   Dressing Status and Intervention Dressing Intact;Reinforced Dressing   Tego Caps on Catheter Yes   NEW Tego Caps placed (Date) 12/03/15   Vitals   Temp 97.1 F (36.2 C)   Heart Rate 93   Resp Rate 18   BP (!) 170/98   Assessment   Mental Status Alert;Oriented;Uncoorporative   Cardiac (WDL) WDL   Cardiac Regularity Regular   Respiratory Pattern Irregular;Dyspnea with exertion   Bilateral Breath Sounds Diminished   R Breath Sounds Diminished   L Breath Sounds Diminished   Cough Non-productive   Facial None   Sacral UTA   RLE Edema +1   LLE Edema +1   General Skin Color Appropriate for ethnicity   Skin  Condition/Temp Warm;Dry   Gastrointestinal (WDL) WDL   GI Symptoms None   Mobility Bed   Education   Person taught Patient   Knowledge basis Minimal   Topics taught Fluid Management   Teaching Tools Explain   Reponse Verbalizes Understanding   Bedside Nurse Communication   Name of bedside RN - post dialysis Marlise Eves RN

## 2016-01-05 NOTE — Progress Notes (Signed)
Jorge Edward, MD  [facility]  Tel:  [phone]      Assessment and Plan:      45 y/o with CAD , HTN, ESRD on HD , presents with  Nausea , fever and chest pain     CAD : nonobstructive coronary artery disease by cardiac cath 2015 at [facility]   Chest pain: Atypical. Negative CE , No acute EKG changes . Cath 2015 as above . No further cardiac evaluation   Hypertensive urgency : now better .  Recommend  Increasing Coreg   ESRD on HD: Continues to be volume overload   OSA : Not on CPAP    Cardiology Diagnostics     Telemetry:  (I have personally reviewed the telemetry strips)  NSr    Echocardiogram: ( personally reviewed the report)  Lv N size , LVEF 38% , RV dilated, reduced fn, Grade II DDD  Problem list     Patient Active Problem List   Diagnosis   . Acute pain due to trauma   . Pulmonary contusion, initial encounter   . ESRD (end stage renal disease) on dialysis   . Hypertension   . Nausea & vomiting   . Decreased appetite   . Ascites   . Diarrhea of infectious origin   . HAP (hospital-acquired pneumonia)   . Diarrhea   . Hospital-acquired pneumonia   . Drug-seeking behavior   . Chest pain in adult   . Acute on chronic congestive heart failure        Events/ROS/ Subjective since last 24 hrs:         Feels ok  SOB even after HD( although cuts down his HD sessions)  Objective:   Vitals reviewed     VS: BP (!) 170/98   Pulse 93   Temp 97.1 F (36.2 C)   Resp 18   Ht 1.854 m (6\' 1" )   Wt 107.9 kg (237 lb 14.4 oz)   SpO2 99%   BMI 31.39 kg/m     Intake/Output Summary (Last 24 hours) at 01/05/16 1633  Last data filed at 01/05/16 1305   Gross per 24 hour   Intake              300 ml   Output             2500 ml   Net            -2200 ml     Constitutional :  no acute distress,  Eyes:  No Pallor or Icterus.  ENMT:   mucous membranes moist.. No Cyanosis  Neck: +JVD.   normal thyroid gland  Respiratory:  Poor  air movement and respiratory effort  bilaterally. No use of accessory muscles.  Decreased BS at the bases.  No rales orwheezes.  Cardiovascular:   PMI  Not displaced. Regular . Nl S1 and S2.Marland Kitchen  NO MRG. No carotid bruits.   No abdominal bruits heard . Dorsal pedis 2+b/l.   No  edema.     Extremities: No Clubbing and cyanosis  Gastrointestinal Soft. Non-tender. Normoactive BS. No abdominal bruits  Skin: Warm. Dry ,  No Ulcers  Neurologic: Grossly intact.    Musculoskeletal: No  Kyphosis or Scoliosis.  Normal gait .   Psychiatric: AAO X3.  Normal mood and effect.      Laboratory Studies:   (I have personally reviewed the laboratory values below)      CBC w/Diff     Recent Labs  Lab 01/05/16  0543 01/02/16  1044 12/30/15  1455   WBC 8.45 8.61 9.74   Hgb 8.7* 7.5* 7.6*   Hematocrit 27.6* 24.2* 24.0*   Platelets 114* 116* 113*          Basic Metabolic Profile     Recent Labs  Lab 01/05/16  0544 01/02/16  1044 12/30/15  1455   Sodium 141 144 141   Potassium 5.9* 4.6 4.9   Chloride 103 104 104   CO2 23 23 21*   BUN 63* 56* 69*   Creatinine 9.2* 8.0* 7.4*   EGFR 7.5 8.9 9.7   Glucose 91 108* 97   Calcium 6.4* 5.5* 5.5*            Cardiac Enzymes     Recent Labs  Lab 12/31/15  0550 12/30/15  1455   Troponin I 0.07 0.08          Thyroid Studies         Invalid input(s): FREET4       Cholesterol Panel            Coagulation Studies                Current Medications   Meds reviewed:    Current Facility-Administered Medications   Medication Dose Route Frequency   . albuterol  2.5 mg Nebulization Q4H SCH   . amLODIPine  10 mg Oral Daily   . calcium carbonate  1,000 mg Oral BID   . carvedilol  25 mg Oral Q12H SCH   . darbepoetin alfa  100 mcg Subcutaneous Weekly   . furosemide  40 mg Intravenous BID   . hydrALAZINE  100 mg Oral TID   . isosorbide dinitrate  10 mg Oral TID   . lidocaine  1 patch Transdermal Q24H   . minoxidil  5 mg Oral Q12H SCH   . pantoprazole  40 mg Oral Daily   . sevelamer  800 mg Oral TID MEALS                  Jorge Edward, MD  01/05/2016 4:33 PM

## 2016-01-06 ENCOUNTER — Other Ambulatory Visit: Payer: Self-pay

## 2016-01-06 ENCOUNTER — Observation Stay: Payer: Medicare Other

## 2016-01-06 ENCOUNTER — Observation Stay
Admission: AD | Admit: 2016-01-06 | Discharge: 2016-01-08 | Disposition: A | Discharge status: Home / Self Care | Payer: Medicare Other | Source: Other Acute Inpatient Hospital | Attending: Family Medicine | Admitting: Family Medicine

## 2016-01-06 ENCOUNTER — Observation Stay: Payer: Medicare Other | Admitting: Internal Medicine

## 2016-01-06 DIAGNOSIS — G4733 Obstructive sleep apnea (adult) (pediatric): Secondary | ICD-10-CM | POA: Insufficient documentation

## 2016-01-06 DIAGNOSIS — I10 Essential (primary) hypertension: Secondary | ICD-10-CM | POA: Diagnosis present

## 2016-01-06 DIAGNOSIS — Z9115 Patient's noncompliance with renal dialysis: Secondary | ICD-10-CM | POA: Insufficient documentation

## 2016-01-06 DIAGNOSIS — Z87891 Personal history of nicotine dependence: Secondary | ICD-10-CM | POA: Insufficient documentation

## 2016-01-06 DIAGNOSIS — Z992 Dependence on renal dialysis: Secondary | ICD-10-CM | POA: Insufficient documentation

## 2016-01-06 DIAGNOSIS — R9430 Abnormal result of cardiovascular function study, unspecified: Secondary | ICD-10-CM

## 2016-01-06 DIAGNOSIS — R0789 Other chest pain: Principal | ICD-10-CM | POA: Insufficient documentation

## 2016-01-06 DIAGNOSIS — E785 Hyperlipidemia, unspecified: Secondary | ICD-10-CM | POA: Insufficient documentation

## 2016-01-06 DIAGNOSIS — R188 Other ascites: Secondary | ICD-10-CM | POA: Insufficient documentation

## 2016-01-06 DIAGNOSIS — J45909 Unspecified asthma, uncomplicated: Secondary | ICD-10-CM | POA: Insufficient documentation

## 2016-01-06 DIAGNOSIS — N186 End stage renal disease: Secondary | ICD-10-CM

## 2016-01-06 DIAGNOSIS — I2510 Atherosclerotic heart disease of native coronary artery without angina pectoris: Secondary | ICD-10-CM | POA: Insufficient documentation

## 2016-01-06 DIAGNOSIS — R079 Chest pain, unspecified: Secondary | ICD-10-CM | POA: Diagnosis present

## 2016-01-06 DIAGNOSIS — I132 Hypertensive heart and chronic kidney disease with heart failure and with stage 5 chronic kidney disease, or end stage renal disease: Secondary | ICD-10-CM | POA: Insufficient documentation

## 2016-01-06 DIAGNOSIS — I509 Heart failure, unspecified: Secondary | ICD-10-CM | POA: Insufficient documentation

## 2016-01-06 DIAGNOSIS — D631 Anemia in chronic kidney disease: Secondary | ICD-10-CM | POA: Insufficient documentation

## 2016-01-06 DIAGNOSIS — I472 Ventricular tachycardia: Secondary | ICD-10-CM | POA: Insufficient documentation

## 2016-01-06 LAB — BASIC METABOLIC PANEL
BUN: 42 mg/dL — ABNORMAL HIGH (ref 9.0–28.0)
BUN: 45 mg/dL — ABNORMAL HIGH (ref 9.0–28.0)
CO2: 22 mEq/L (ref 22–29)
CO2: 22 mEq/L (ref 22–29)
Calcium: 6.1 mg/dL — ABNORMAL LOW (ref 8.5–10.5)
Calcium: 5.6 mg/dL — CL (ref 8.5–10.5)
Chloride: 104 mEq/L (ref 100–111)
Chloride: 105 mEq/L (ref 100–111)
Creatinine: 7.6 mg/dL — ABNORMAL HIGH (ref 0.7–1.3)
Creatinine: 7.9 mg/dL — ABNORMAL HIGH (ref 0.7–1.3)
Glucose: 78 mg/dL (ref 70–100)
Glucose: 98 mg/dL (ref 70–100)
Potassium: 4.5 mEq/L (ref 3.5–5.1)
Potassium: 4.5 mEq/L (ref 3.5–5.1)
Sodium: 141 mEq/L (ref 136–145)
Sodium: 141 mEq/L (ref 136–145)

## 2016-01-06 LAB — PHOSPHORUS: Phosphorus: 5.8 mg/dL — ABNORMAL HIGH (ref 2.3–4.7)

## 2016-01-06 LAB — IHS D-DIMER: D-Dimer: 3.98 ug/mL FEU — ABNORMAL HIGH (ref 0.00–0.51)

## 2016-01-06 LAB — MAGNESIUM
Magnesium: 1.9 mg/dL (ref 1.6–2.6)
Magnesium: 1.8 mg/dL (ref 1.6–2.6)

## 2016-01-06 LAB — CK
Creatine Kinase (CK): 937 U/L — ABNORMAL HIGH (ref 47–267)
Creatine Kinase (CK): 849 U/L — ABNORMAL HIGH (ref 47–267)

## 2016-01-06 LAB — TROPONIN I
Troponin I: 0.09 ng/mL (ref 0.00–0.09)
Troponin I: 0.09 ng/mL (ref 0.00–0.09)

## 2016-01-06 LAB — GFR
EGFR: 9.4
EGFR: 9

## 2016-01-06 MED ORDER — ONDANSETRON HCL 4 MG/2ML IJ SOLN
4.0000 mg | INTRAMUSCULAR | Status: DC | PRN
Start: 2016-01-06 — End: 2016-01-08
  Administered 2016-01-06 (×2): 4 mg via INTRAVENOUS
  Filled 2016-01-06 (×2): qty 2

## 2016-01-06 MED ORDER — HYDROMORPHONE HCL 4 MG PO TABS
4.0000 mg | ORAL_TABLET | ORAL | Status: DC | PRN
Start: 2016-01-06 — End: 2016-01-06

## 2016-01-06 MED ORDER — SODIUM CHLORIDE 0.9 % IV BOLUS
100.0000 mL | INTRAVENOUS | Status: AC | PRN
Start: 2016-01-06 — End: 2016-01-06

## 2016-01-06 MED ORDER — DARBEPOETIN ALFA 100 MCG/0.5ML IJ SOSY
100.0000 ug | PREFILLED_SYRINGE | INTRAMUSCULAR | Status: DC
Start: 2016-01-07 — End: 2016-01-08
  Filled 2016-01-06: qty 0.5

## 2016-01-06 MED ORDER — NITROGLYCERIN 0.4 MG SL SUBL
0.4000 mg | SUBLINGUAL_TABLET | SUBLINGUAL | Status: DC | PRN
Start: 2016-01-06 — End: 2016-01-08

## 2016-01-06 MED ORDER — CALCIUM CARBONATE ANTACID 500 MG PO CHEW
1000.0000 mg | CHEWABLE_TABLET | Freq: Two times a day (BID) | ORAL | Status: DC
Start: 2016-01-06 — End: 2016-01-08
  Administered 2016-01-06 – 2016-01-07 (×3): 1000 mg via ORAL
  Filled 2016-01-06 (×6): qty 2

## 2016-01-06 MED ORDER — CARVEDILOL 25 MG PO TABS
25.0000 mg | ORAL_TABLET | Freq: Two times a day (BID) | ORAL | Status: DC
Start: 2016-01-06 — End: 2016-01-08
  Administered 2016-01-06 – 2016-01-07 (×4): 25 mg via ORAL
  Filled 2016-01-06 (×3): qty 1
  Filled 2016-01-06: qty 4
  Filled 2016-01-06: qty 1

## 2016-01-06 MED ORDER — MINOXIDIL 2.5 MG PO TABS
5.0000 mg | ORAL_TABLET | Freq: Two times a day (BID) | ORAL | Status: DC
Start: 2016-01-06 — End: 2016-01-08
  Administered 2016-01-07 (×3): 5 mg via ORAL
  Filled 2016-01-06 (×7): qty 2

## 2016-01-06 MED ORDER — FUROSEMIDE 10 MG/ML IJ SOLN
40.0000 mg | Freq: Two times a day (BID) | INTRAMUSCULAR | Status: DC
Start: 2016-01-06 — End: 2016-01-08
  Administered 2016-01-06 – 2016-01-07 (×3): 40 mg via INTRAVENOUS
  Filled 2016-01-06 (×4): qty 4

## 2016-01-06 MED ORDER — ISOSORBIDE DINITRATE 10 MG PO TABS
10.0000 mg | ORAL_TABLET | Freq: Three times a day (TID) | ORAL | Status: DC
Start: 2016-01-06 — End: 2016-01-08
  Administered 2016-01-07 – 2016-01-08 (×5): 10 mg via ORAL
  Filled 2016-01-06 (×8): qty 1

## 2016-01-06 MED ORDER — HYDRALAZINE HCL 20 MG/ML IJ SOLN
10.0000 mg | Freq: Four times a day (QID) | INTRAMUSCULAR | Status: DC | PRN
Start: 2016-01-06 — End: 2016-01-08
  Administered 2016-01-06: 10 mg via INTRAVENOUS
  Filled 2016-01-06: qty 1

## 2016-01-06 MED ORDER — SODIUM CHLORIDE 0.9 % IV BOLUS
250.0000 mL | INTRAVENOUS | Status: AC | PRN
Start: 2016-01-06 — End: 2016-01-07

## 2016-01-06 MED ORDER — ALBUTEROL SULFATE (2.5 MG/3ML) 0.083% IN NEBU
2.5000 mg | INHALATION_SOLUTION | RESPIRATORY_TRACT | Status: DC
Start: 2016-01-06 — End: 2016-01-06

## 2016-01-06 MED ORDER — HYDRALAZINE HCL 50 MG PO TABS
100.0000 mg | ORAL_TABLET | Freq: Three times a day (TID) | ORAL | Status: DC
Start: 2016-01-06 — End: 2016-01-08
  Administered 2016-01-06 – 2016-01-08 (×5): 100 mg via ORAL
  Filled 2016-01-06 (×5): qty 2

## 2016-01-06 MED ORDER — SEVELAMER CARBONATE 800 MG PO TABS
800.0000 mg | ORAL_TABLET | Freq: Three times a day (TID) | ORAL | Status: DC
Start: 2016-01-06 — End: 2016-01-08
  Administered 2016-01-06 – 2016-01-08 (×6): 800 mg via ORAL
  Filled 2016-01-06 (×6): qty 1

## 2016-01-06 MED ORDER — HYDROMORPHONE HCL 4 MG PO TABS
4.0000 mg | ORAL_TABLET | Freq: Once | ORAL | Status: DC
Start: 2016-01-06 — End: 2016-01-08
  Filled 2016-01-06 (×2): qty 1

## 2016-01-06 MED ORDER — HYDROMORPHONE HCL 1 MG/ML IJ SOLN
0.4000 mg | INTRAMUSCULAR | Status: DC | PRN
Start: 2016-01-06 — End: 2016-01-07
  Administered 2016-01-06 – 2016-01-07 (×6): 0.4 mg via INTRAVENOUS
  Filled 2016-01-06 (×6): qty 1

## 2016-01-06 MED ORDER — AMLODIPINE BESYLATE 5 MG PO TABS
10.0000 mg | ORAL_TABLET | Freq: Every day | ORAL | Status: DC
Start: 2016-01-06 — End: 2016-01-07
  Administered 2016-01-07: 10 mg via ORAL
  Filled 2016-01-06 (×2): qty 2

## 2016-01-06 MED ORDER — HEPARIN SODIUM (PORCINE) 5000 UNIT/ML IJ SOLN
5000.0000 [IU] | Freq: Three times a day (TID) | INTRAMUSCULAR | Status: DC
Start: 2016-01-06 — End: 2016-01-08
  Administered 2016-01-06 – 2016-01-08 (×2): 5000 [IU] via SUBCUTANEOUS
  Filled 2016-01-06 (×5): qty 1

## 2016-01-06 MED ORDER — LIDOCAINE 5 % EX PTCH
1.0000 | MEDICATED_PATCH | CUTANEOUS | Status: DC
Start: 2016-01-06 — End: 2016-01-08
  Administered 2016-01-06: 1 via TRANSDERMAL
  Filled 2016-01-06 (×2): qty 1

## 2016-01-06 MED ORDER — SODIUM CHLORIDE 0.9 % IV BOLUS
100.0000 mL | INTRAVENOUS | Status: AC | PRN
Start: 2016-01-06 — End: 2016-01-07

## 2016-01-06 MED ORDER — ACETAMINOPHEN 325 MG PO TABS
325.0000 mg | ORAL_TABLET | ORAL | Status: DC | PRN
Start: 2016-01-06 — End: 2016-01-08
  Administered 2016-01-06: 325 mg via ORAL
  Filled 2016-01-06: qty 1

## 2016-01-06 MED ORDER — HEPARIN SODIUM (PORCINE) 5000 UNIT/ML IJ SOLN
5000.0000 [IU] | Freq: Three times a day (TID) | INTRAMUSCULAR | Status: DC
Start: 2016-01-06 — End: 2016-01-06

## 2016-01-06 MED ORDER — PANTOPRAZOLE SODIUM 40 MG PO TBEC
40.0000 mg | DELAYED_RELEASE_TABLET | Freq: Every day | ORAL | Status: DC
Start: 2016-01-06 — End: 2016-01-08
  Administered 2016-01-07: 40 mg via ORAL
  Filled 2016-01-06 (×3): qty 1

## 2016-01-06 MED ORDER — SODIUM CHLORIDE 0.9 % IV BOLUS
250.0000 mL | INTRAVENOUS | Status: AC | PRN
Start: 2016-01-06 — End: 2016-01-06

## 2016-01-06 MED ORDER — ALBUTEROL SULFATE (2.5 MG/3ML) 0.083% IN NEBU
2.5000 mg | INHALATION_SOLUTION | Freq: Four times a day (QID) | RESPIRATORY_TRACT | Status: DC | PRN
Start: 2016-01-06 — End: 2016-01-08
  Administered 2016-01-07 – 2016-01-08 (×3): 2.5 mg via RESPIRATORY_TRACT
  Filled 2016-01-06 (×3): qty 3

## 2016-01-06 NOTE — H&P (Signed)
ADMISSION HISTORY AND PHYSICAL EXAM    Date Time: 01/06/16 5:40 AM  Patient Name: [name]  Attending Physician: Candiss Norse, *  Primary Care Physician: Christa See, MD    CC: CP      Assessment:   Active Problems:    * No active hospital problems. *      37M with CAD, HTN, ESRD no HD, presented to ED with CP, hypertensive urgency, elevated Ddimer.    # CAD - non-obstructive CAD by LHC 2015  # CP   # Hypertensive urgency  # ESRD on HD  # OSA not on CPAP  # HFrEF    Plan:     Admit for observation.   Nephrology consult with Dr. Verdell Face - please call to see if patient should get HD today. If there is no need for HD and patient rules out, can likely [name]/c home.  Would update [facility] Cardiology that patient returned for CP, but if no intervention planned, [name]/c home.   Ddimer reportedly elevated, I have rechecked Ddimer, but patient has no risk factors for DVT/PE. He did receive 100mg  lovenox at UC.  Cont Norvasc 10mg , coreg 25mg  BID, hydralazine 100mg  TID, isordil 10mg  TID, minoxidil 5mg  BID.  Cont Sevelamer.  HSQ for DVT ppx.  Patient left AMA from OSH just last night due to IV pain medications being stopped.  He initially denied this.   If no further plans for cardiac intervention or HD, and no need for CTA, can likely [name]/c home.   I do not believe that he needs opioids or phenergan IV as he is sleeping comfortably.      Disposition: (Please see PAF column for Expected [name]/C Date)   Today's date: 01/06/2016  Admit Date: 01/06/2016  4:43 AM  Anticipated medical stability for discharge:Yellow - maybe tomorrow  Service status: Observation:patient is stable and improving.  Clinical Milestones: HD, cardiac clearance  Anticipated discharge needs: None    History of Presenting Illness:   [name] is a 45 y.o. male with ESRD on HD, HTN, CHF, p/w CP at outside ED, found to have trop 0.06 and hypertensive urgency with SBP 203.  He was also noted to have elevated ddimer, but they could not get appropriate  access to get a CTA, so he was given lovenox 100mg  and transferred to [facility] for cardiac w/u.  Upon review of records, patient was at OSH until yesterday and left AMA when his IV pain medications were changed to oral.  He had been evaluated by Cardiology and LHC 2015 and [facility] showed non-obstructive CAD, and he was cleared from a Cardiology standpoint. He reports pain is similar to his hospitalization yesterday.    Currently, he denies CP, SOB, headache, dizziness.     Past Medical History:     Past Medical History:   Diagnosis Date   . Asthma without status asthmaticus    . 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 Clostridium difficile diarrhea    . Drug-seeking behavior    . Hypertension    . Kidney failure        Available old records reviewed, including: Chart reviewed, left AMA yesterday from OSH after IV pain meds changed to oral per documentation    Past Surgical History:     Past Surgical History:   Procedure Laterality Date   . AV FISTULA PLACEMENT     . PERITONEAL CATHETER INSERTION         Family History:  Social History:     History   Smoking Status   . Former Smoker   . Packs/day: 0.50   . Types: Cigarettes   . Quit date: 09/18/2015   Smokeless Tobacco   . Former User     History   Alcohol Use No     History   Drug Use No       Allergies:     Allergies   Allergen Reactions   . Lisinopril Anaphylaxis   . Morphine Anaphylaxis   . Toradol [Ketorolac Tromethamine]        Medications:     Home Medications             albuterol (PROVENTIL) (2.5 MG/3ML) 0.083% nebulizer solution     Take 2.5 mg by nebulization 2 (two) times daily as needed.         albuterol-ipratropium (COMBIVENT RESPIMAT) 20-100 MCG/ACT Aero Soln     Inhale 2 puffs into the lungs daily.         amLODIPine (NORVASC) 10 MG tablet     Take 10 mg by mouth daily.         carvedilol (COREG) 25 MG tablet     Take 25 mg by mouth 2 (two) times daily.     furosemide (LASIX) 80 MG tablet     Take 80 mg by mouth daily.     hydrALAZINE (APRESOLINE) 100 MG  tablet     Take 100 mg by mouth 3 (three) times daily.     pantoprazole (PROTONIX) 40 MG tablet     Take 40 mg by mouth daily.     sevelamer (RENVELA) 800 MG tablet     Take 800 mg by mouth 3 (three) times daily with meals.            Method by which medications were confirmed on admission: patient    Review of Systems:   All other systems were reviewed and are negative except: HPI    Physical Exam:   Patient Vitals for the past 24 hrs:   BP Temp Temp src Pulse Resp SpO2 Height Weight   01/06/16 0458 (!) 173/95 97.8 F (36.6 C) Oral 91 20 100 % 1.854 m (6\' 1" ) 105.7 kg (233 lb)     Body mass index is 30.74 kg/m.  No intake or output data in the 24 hours ending 01/06/16 0540    General: awake, alert, oriented x 3; no acute distress.  HEENT: sclera anicteric  oropharynx clear without lesions, mucous membranes moist  Neck: supple  Cardiovascular: regular rate and rhythm, no murmurs, rubs or gallops  Lungs: clear to auscultation bilaterally, without wheezing, rhonchi, or rales  Abdomen: soft, non-tender, non-distended; normoactive bowel sounds, no rebound or guarding  Extremities: no clubbing, cyanosis, or edema        Labs:     Results     ** No results found for the last 24 hours. **          Imaging personally reviewed, including: no imaging available    Safety Checklist  DVT prophylaxis:  CHEST guideline (See page [id number]) Chemical   Foley:  Florin Rn Foley protocol Not present   IVs:  Peripheral IV   PT/OT: Ordered   Daily CBC & or Chem ordered:  SHM/ABIM guidelines (see #5) Yes, due to clinical and lab instability   Reference for approximate charges of common labs: CBC auto diff - $76  BMP - $99  Mg - $79  Signed by: Candiss Norse, MD   cc:Pcp, Octaviano Glow, MD

## 2016-01-06 NOTE — Progress Notes (Addendum)
01/06/16 Assessment  Met with pt and explained role of CCM.PT verbalizes understanding. Pt reports he has not needed any DME, Acute Rehab  HH or SNf care. Does not have an Scientist, water quality. Sister will be designated Clinical research associate if he is unable to speak. Lives in a single level town home. Drives himself. [facility]ses [facility] his [facility]. [facility] HD M[facility]F at [facility]. Pt reports his Nephrologist manages all his medical needs. Does not  Have a PCP nor a Cardiologist..  Neila Gear RN, CM  Case Management  [phone]     01/06/16 1215   Patient Type   Within 30 Days of Previous Admission? Yes   Healthcare Decisions   Interviewed: Patient   Orientation/Decision Making Abilities of Patient Alert and Oriented x3, able to make decisions   Advance Directive Patient does not have advance directive   Healthcare Agent Appointed Yes   Healthcare Agent's Name Newt Minion, sister   Healthcare Agent's Phone Number [phone]   Prior to admission   Prior level of function Independent with ADLs   Type of Residence Private residence   Home Layout One level   Have running water, electricity, heat, etc? Yes   Living Arrangements Alone   How do you get to your MD appointments? self   How do you get your groceries? self   Who fixes your meals? self   Who does your laundry? self   Who picks up your prescriptions? self   Dressing Independent   Grooming Independent   Feeding Independent   Bathing Independent   Toileting Independent   DME Currently at Home Other (Comment)  (No DME)   Name of Prior Assisted Living Facility n/a   Home Care/Community Services None   Prior SNF admission? (Detail) n/a   Prior Rehab admission? (Detail) n/a   Adult Protective Services (APS) involved? No   Discharge Planning   Support Systems Family members   Patient expects to be discharged to: Home

## 2016-01-06 NOTE — Plan of Care (Signed)
Problem: Safety  Goal: Patient will be free from injury during hospitalization  Outcome: Progressing   01/06/16 0600   Goal/Interventions addressed this shift   Patient will be free from injury during hospitalization  Assess patient's risk for falls and implement fall prevention plan of care per policy;Provide and maintain safe environment;Use appropriate transfer methods;Ensure appropriate safety devices are available at the bedside;Include patient/ family/ care giver in decisions related to safety;Hourly rounding       Problem: Psychosocial and Spiritual Needs  Goal: Demonstrates ability to cope with hospitalization/illness  Outcome: Progressing   01/06/16 0600   Goal/Interventions addressed this shift   Demonstrates ability to cope with hospitalizations/illness Encourage verbalization of feelings/concerns/expectations;Provide quiet environment;Assist patient to identify own strengths and abilities;Encourage patient to set small goals for self;Encourage participation in diversional activity;Reinforce positive adaptation of new coping behaviors;Include patient/ patient care companion in decisions;Communicate referral to spiritual care as appropriate       Problem: Chest Pain  Goal: Cardiac pain management  Outcome: Progressing   01/06/16 0600   Goal/Interventions addressed this shift   Cardiac pain management  Assess/report chest pain/or related discomfort to LIP immediately;Instruct patient to report any change in pain status;Assess pain/or related discomfort on admission, during daily assessment, before and after any intervention;Include patient and patient care companion in decisions related to pain management       Comments: Patient admitted from [facility] hospital with chest pain at approximately 0500. Patient alert and oriented times three, sleepy but easily arousable. Tele is sinus rhythm with heart rate in the 90's.Blood pressure on arrival was 173/95.  Patient oriented to room, tele and nurse call light.  Patient has been sleeping upon arrival but when questioned about pain, he states he has  chest pain on a scale of 8/10. Patient currently on oxygen at 2l with saturation reading 100%. He had been medicated with dilaudid IV prior to transfer. Admitting physician notified for admission orders. Will continue to monitor by hourly rounding, call bell placed within patient's reach.

## 2016-01-06 NOTE — Plan of Care (Signed)
Problem: Pain  Goal: Pain at adequate level as identified by patient  Outcome: Progressing      Problem: Chest Pain  Goal: Anxiety management/effective coping  Outcome: Progressing  Patient is alert and oriented x4. Patient is c/o chest pain 7/10 this am and c/o nausea.medicated with iv dilaudid 0.4 mg.DR chaudhri notified about the chest pain and nausea. Iv zofran 4mg  given this am for nausea this am.,patient refused to take po dilaudid 4mg  that was ordered by MD. Patient requested iv dilaudid. 2nd troponin at 0700 0.09, MD is aware.ck 937. Cardiology is consulted. CTA is ordered. Patient stated he is very nauseous and not able to take any of his medication po at this time.fall safety precautions reviewed with patient, patient refused scd on bilateral lower extremities.fall matt on the floor.telemetry sr 90,18106ml fluid restriction continued.D dimer 3.98, MD is aware.#rd ck is pending.awaiting iv team for midline placement for cta later today.left chest permacath dressing is dry and intact.new fistula on left arm is intact and patient has old fistula on the right arm.continue to monitor for any ectopy and chest pain.

## 2016-01-06 NOTE — Procedures (Signed)
Pt requested to come-off early- had only 3.0hrs tx - supervised ++ - with no effect. MD made aware. Dialyzed with 3.0K/2.5 Ca  [place].4.0 liters fluid off. BP very high despite fluid removal- PRN Hydralazine given with some effect .

## 2016-01-06 NOTE — Progress Notes (Signed)
[facility] [employer].  Progress Note    Assessment:    45 y.o male with HTN, ESRD on HD, HLD, CAD, OSA, is admitted with chest pain and hypertensive urgency     -ESRD on HD MWF; received a short dialysis yesterday; will be dialyzed today   -Hypertension with CKD; likely volume driven   -CAD; enzymes negative   -Anemia of CKD  -Hyperphosphatemia     Plan:    -HD today and in am  -supportive care   -labs   -rule out ACS    Burnis Medin, MD  Office - [phone]  [employer] [phone]  ++++++++++++++++++++++++++++++++++++++++++++++++++++++++++++++  Subjective:    C/o chest pain     Medications:  Scheduled Meds:  Current Facility-Administered Medications   Medication Dose Route Frequency   . amLODIPine  10 mg Oral Daily   . calcium carbonate  1,000 mg Oral BID   . carvedilol  25 mg Oral Q12H SCH   . [START ON 01/07/2016] darbepoetin alfa  100 mcg Subcutaneous Weekly   . furosemide  40 mg Intravenous BID   . heparin (porcine)  5,000 Units Subcutaneous Q8H SCH   . hydrALAZINE  100 mg Oral Q8H SCH   . HYDROmorphone  4 mg Oral Once   . isosorbide dinitrate  10 mg Oral Q8H SCH   . lidocaine  1 patch Transdermal Q24H   . minoxidil  5 mg Oral Q12H SCH   . pantoprazole  40 mg Oral Daily   . sevelamer  800 mg Oral TID MEALS     Continuous Infusions:   PRN Meds:acetaminophen, albuterol, hydrALAZINE, HYDROmorphone, nitroglycerin, ondansetron    Objective:  Vital signs in last 24 hours:  Temp:  [97.4 F (36.3 C)-97.9 F (36.6 C)] 97.9 F (36.6 C)  Heart Rate:  [91-96] 96  Resp Rate:  [18-20] 18  BP: (155-173)/(86-97) 171/97  Intake/Output last 24 hours:  No intake or output data in the 24 hours ending 01/06/16 1323  Intake/Output this shift:  No intake/output data recorded.    Physical Exam:   Gen: WD WN NAD   CV: S1 S2 N RRR   Chest: CTAB   Ab: ND NT soft    Ext: No C/E    Labs:    Recent Labs  Lab 01/06/16  0721 01/05/16  0544 01/02/16  1044 12/30/15  1455   Glucose 78 91 108* 97   BUN 42.0* 63*  56* 69*   Creatinine 7.6* 9.2* 8.0* 7.4*   Calcium 6.1* 6.4* 5.5* 5.5*   Sodium 141 141 144 141   Potassium 4.5 5.9* 4.6 4.9   Chloride 104 103 104 104   CO2 22 23 23  21*   Albumin  --   --  2.6* 2.6*   Phosphorus  --  7.6* 6.6* 6.1*   Magnesium 1.9 2.1  --  1.8       Recent Labs  Lab 01/05/16  0543 01/02/16  1044 12/30/15  1455   WBC 8.45 8.61 9.74   Hgb 8.7* 7.5* 7.6*   Hematocrit 27.6* 24.2* 24.0*   MCV 94.2 94.2 93.0   MCH 29.7 29.2 29.5   MCHC 31.5* 31.0* 31.7*   RDW 17* 17* 16*   MPV 10.3 10.7 10.5   Platelets 114* 116* 113*

## 2016-01-06 NOTE — UM Notes (Signed)
01/06/16 0553  Place (admit) for Observation Services Once       General Criteria: Observation Care - Observation Care Admission Criteria    45 y.o. male with ESRD on HD, HTN, CHF, p/w CP at outside ED, found to have trop 0.06 and hypertensive urgency with SBP 203.  He was also noted to have elevated ddimer, but they could not get appropriate access to get a CTA, so he was given lovenox 100mg  and transferred to [facility] for cardiac w/u.  Upon review of records, patient was at OSH until yesterday and left AMA when his IV pain medications were changed to oral.  He had been evaluated by Cardiology and LHC 2015 and [facility] showed non-obstructive CAD, and he was cleared from a Cardiology standpoint. He reports pain is similar to his hospitalization yesterday.    Currently, he denies CP, SOB, headache, dizziness.     VS: 97.8, 91, 20, 173/95, 100%, pain 8/10    Labs: BUN 42, Creatinine 7.6, Calcium 6.1, Creatinine Kinase 937, D-dimer 3.l98     # CAD - non-obstructive CAD by LHC 2015  # CP   # Hypertensive urgency  # ESRD on HD  # OSA not on CPAP  # HFrEF    Plan:     Admit for observation.   Nephrology consult with Dr. Verdell Face - please call to see if patient should get HD today. If there is no need for HD and patient rules out, can likely d/c home.  Would update [facility] Cardiology that patient returned for CP, but if no intervention planned, d/c home.   Ddimer reportedly elevated, I have rechecked Ddimer, but patient has no risk factors for DVT/PE. He did receive 100mg  lovenox at UC.  Cont Norvasc 10mg , coreg 25mg  BID, hydralazine 100mg  TID, isordil 10mg  TID, minoxidil 5mg  BID.  Cont Sevelamer.  HSQ for DVT ppx.  Patient left AMA from OSH just last night due to IV pain medications being stopped.  He initially denied this.   If no further plans for cardiac intervention or HD, and no need for CTA, can likely d/c home.   I do not believe that he needs opioids or phenergan IV as he is sleeping comfortably.      Disposition:  (Please see PAF column for Expected D/C Date)   Today's date: 01/06/2016  Admit Date: 01/06/2016  4:43 AM  Anticipated medical stability for discharge:Yellow - maybe tomorrow  Service status: Observation:patient is stable and improving.  Clinical Milestones: HD, cardiac clearance  Anticipated discharge needs: None      Scheduled Meds:  Current Facility-Administered Medications   Medication Dose Route Frequency   . amLODIPine  10 mg Oral Daily   . calcium carbonate  1,000 mg Oral BID   . carvedilol  25 mg Oral Q12H SCH   . [START ON 01/07/2016] darbepoetin alfa  100 mcg Subcutaneous Weekly   . furosemide  40 mg Intravenous BID   . heparin (porcine)  5,000 Units Subcutaneous Q8H SCH   . hydrALAZINE  100 mg Oral Q8H SCH   . HYDROmorphone  4 mg Oral Once   . isosorbide dinitrate  10 mg Oral Q8H SCH   . lidocaine  1 patch Transdermal Q24H   . minoxidil  5 mg Oral Q12H SCH   . pantoprazole  40 mg Oral Daily   . sevelamer  800 mg Oral TID MEALS     Continuous Infusions:   PRN Meds:.acetaminophen, albuterol, hydrALAZINE, HYDROmorphone, nitroglycerin, ondansetron      -  prn Zofran 4mg  IV @0831   -prn Dilaudid 0.4mg  IV @ 1051     Patient admitted from [facility] hospital with chest pain at approximately 0500. Patient alert and oriented times three, sleepy but easily arousable. Tele is sinus rhythm with heart rate in the 90's.Blood pressure on arrival was 173/95.  Patient oriented to room, tele and nurse call light. Patient has been sleeping upon arrival but when questioned about pain, he states he has  chest pain on a scale of 8/10. Patient currently on oxygen at 2l with saturation reading 100%. He had been medicated with dilaudid IV prior to transfer.     Derrel Nip, RN, BSN  ACM  Utilization Review Case Manager   [employer]  [phone]

## 2016-01-06 NOTE — Progress Notes (Signed)
Attempted multiple times for iv blood draw 3rd troponin at 1000 am, unable to \\get  any blood from peripheral stick, notified DR Dorthea Cove. 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 Patient has been refusing all his medications today, patient stated that he is very nauseous to take any medication or to eat any of his food. zofran 4 mg iv given as ordered x2 today.iv dilaudid 0.4 mg given per request , patient stated that he has chest pain 8/10.telemetry NSR 90.continuie to monitor chest pain, blood pressure 155/60.2lnc99%

## 2016-01-06 NOTE — Progress Notes (Signed)
Spoke with RN. Patient is not bedside candidate due to fistula (new) in left arm and old fistula in right upper arm. VAT will be unable to place a line.

## 2016-01-06 NOTE — Progress Notes (Signed)
Patient had 14 beats run of Vtach at about 1550, notified DR Ellis Parents, oredered for bmp, magnesium and phosphorus blood work when patient goes to hemodialysis. Patient is treansported to hemodialysis at this time at 1600 by transporter and patient will be moving to [facility] room 359 after dialysis.report called to RN at [facility]  At this time. All belongings are packed with patient,cell phone is with patient.

## 2016-01-06 NOTE — Plan of Care (Addendum)
Pt seen and examined. Refer to H&P for HPI. Discussed case with Cardiology ([facility]) as patient was seen there yesterday at [facility]. Atypical chest pain and less likely cardiac nature of chest pain. C/w r/o with trops (thus far negative). If needed can consult Cards officially tomm. Given elevated D dimer would check CTA. Discussed with Dr. Tereso Newcomer from Nephro, and ok to get CTA and will do HD today and again tomm. However, pt has no access. Midline can't be placed 2/2 AVF in both arms, staff can't get 18 gauge in. Will obtain b/l LE dopplers to eval for DVT. If dopplers positive consider V/Q scan to check for PE also. Also has hx of drug seeking behavior and having episodes of nausea. IV pain control today, transition to PO tomm. Discussed with Cards, Nephro, and RN. Pt had beats of NSVT on tele, Labs ordered to monitor lytes during HD as only access available as hard stick. Cardiology [facility] officially consulted to see patient.

## 2016-01-07 ENCOUNTER — Other Ambulatory Visit: Payer: Self-pay

## 2016-01-07 ENCOUNTER — Observation Stay: Payer: Medicare Other

## 2016-01-07 DIAGNOSIS — I2510 Atherosclerotic heart disease of native coronary artery without angina pectoris: Secondary | ICD-10-CM

## 2016-01-07 DIAGNOSIS — R0789 Other chest pain: Secondary | ICD-10-CM

## 2016-01-07 DIAGNOSIS — I472 Ventricular tachycardia: Secondary | ICD-10-CM

## 2016-01-07 DIAGNOSIS — I4581 Long QT syndrome: Secondary | ICD-10-CM

## 2016-01-07 DIAGNOSIS — I160 Hypertensive urgency: Secondary | ICD-10-CM

## 2016-01-07 DIAGNOSIS — I129 Hypertensive chronic kidney disease with stage 1 through stage 4 chronic kidney disease, or unspecified chronic kidney disease: Secondary | ICD-10-CM

## 2016-01-07 DIAGNOSIS — N189 Chronic kidney disease, unspecified: Secondary | ICD-10-CM

## 2016-01-07 DIAGNOSIS — Z992 Dependence on renal dialysis: Secondary | ICD-10-CM

## 2016-01-07 DIAGNOSIS — R9430 Abnormal result of cardiovascular function study, unspecified: Secondary | ICD-10-CM

## 2016-01-07 DIAGNOSIS — I509 Heart failure, unspecified: Secondary | ICD-10-CM

## 2016-01-07 LAB — ECG 12-LEAD
Atrial Rate: 95 {beats}/min
P Axis: 63 degrees
P-R Interval: 172 ms
Q-T Interval: 400 ms
QRS Duration: 80 ms
QTC Calculation (Bezet): 502 ms
R Axis: -2 degrees
T Axis: 82 degrees
Ventricular Rate: 95 {beats}/min

## 2016-01-07 MED ORDER — HYDROMORPHONE HCL 1 MG/ML IJ SOLN
0.4000 mg | Freq: Three times a day (TID) | INTRAMUSCULAR | Status: DC | PRN
Start: 2016-01-07 — End: 2016-01-08
  Administered 2016-01-07 – 2016-01-08 (×2): 0.4 mg via INTRAVENOUS
  Filled 2016-01-07 (×2): qty 1

## 2016-01-07 MED ORDER — MAGNESIUM OXIDE 400 (241.3 MG) MG PO TABS
400.0000 mg | ORAL_TABLET | Freq: Once | ORAL | Status: AC
Start: 2016-01-07 — End: 2016-01-07
  Administered 2016-01-07: 400 mg via ORAL
  Filled 2016-01-07: qty 1

## 2016-01-07 MED ORDER — ALBUTEROL SULFATE (2.5 MG/3ML) 0.083% IN NEBU
2.5000 mg | INHALATION_SOLUTION | Freq: Four times a day (QID) | RESPIRATORY_TRACT | Status: DC
Start: 2016-01-07 — End: 2016-01-08
  Administered 2016-01-07: 2.5 mg via RESPIRATORY_TRACT
  Filled 2016-01-07 (×2): qty 3

## 2016-01-07 MED ORDER — AMLODIPINE BESYLATE 5 MG PO TABS
10.0000 mg | ORAL_TABLET | Freq: Every day | ORAL | Status: DC
Start: 2016-01-08 — End: 2016-01-08
  Filled 2016-01-07: qty 2

## 2016-01-07 MED ORDER — TRAMADOL HCL 50 MG PO TABS
50.0000 mg | ORAL_TABLET | Freq: Four times a day (QID) | ORAL | Status: DC | PRN
Start: 2016-01-07 — End: 2016-01-08
  Administered 2016-01-08: 50 mg via ORAL
  Filled 2016-01-07 (×2): qty 1

## 2016-01-07 MED ORDER — HYDROMORPHONE HCL 1 MG/ML IJ SOLN
0.4000 mg | Freq: Four times a day (QID) | INTRAMUSCULAR | Status: DC | PRN
Start: 2016-01-07 — End: 2016-01-07
  Administered 2016-01-07: 0.4 mg via INTRAVENOUS
  Filled 2016-01-07: qty 1

## 2016-01-07 MED ORDER — SODIUM CHLORIDE 0.9 % IV BOLUS
250.0000 mL | INTRAVENOUS | Status: AC | PRN
Start: 2016-01-07 — End: 2016-01-07

## 2016-01-07 MED ORDER — LISINOPRIL 5 MG PO TABS
5.0000 mg | ORAL_TABLET | Freq: Every day | ORAL | Status: DC
Start: 2016-01-07 — End: 2016-01-07

## 2016-01-07 MED ORDER — SODIUM CHLORIDE 0.9 % IV BOLUS
100.0000 mL | INTRAVENOUS | Status: AC | PRN
Start: 2016-01-07 — End: 2016-01-07

## 2016-01-07 NOTE — Progress Notes (Signed)
01/07/16    Return call placed to pt discharge planner/ SW at [facility] with Medicare   Provided with updated pt clinical info including/ admission 12/30/15 & discharge date 01/05/16  All questions answered.

## 2016-01-07 NOTE — Progress Notes (Signed)
The pt is a transfer to CTUS. SW reviewed notes and clinicals. At this time, dispo is Home with [facility] HD.    Shirlean Mylar, MSW  Case Management  [facility]  [facility]  [phone]

## 2016-01-07 NOTE — Plan of Care (Addendum)
Pt is scored as a low fall risk. Low fall risk safety measures in place including: bed in lowest position, pt educated on fall prevention strategies and risks, and call bell within reach.         Problem: Safety  Goal: Patient will be free from injury during hospitalization  Outcome: Progressing   01/07/16 2121   Goal/Interventions addressed this shift   Patient will be free from injury during hospitalization  Assess patient's risk for falls and implement fall prevention plan of care per policy;Provide and maintain safe environment;Use appropriate transfer methods;Ensure appropriate safety devices are available at the bedside;Include patient/ family/ care giver in decisions related to safety;Hourly rounding;Assess for patients risk for elopement and implement Elopement Risk Plan per policy;Provide alternative method of communication if needed (communication boards, writing)     Ensured appropriate safety precautions in place including: assigned fall score interventions; review of known allergies, code status, and special needs; personal items within reach; call bell within reach.         Problem: Chest Pain  Goal: Vital signs and cardiac rhythm stable  Outcome: Progressing   01/07/16 2121   Goal/Interventions addressed this shift   Vital signs and cardiac rhythm stable Monitor /assess vital signs/cardiac rhythms;Assess the need for oxygen therapy and administer as ordered;Monitor labs     Assessment: AOx4; rhythm NS to ST on tele; O2 in high 90s on 2.5L NC; ambulation up ad lib; Pt complained of SOB - RT called to provide scheduled neb tx. Patient vital signs stable- see doc flow for complete assessment and vitals. Troponin negative for coronary event. 9/20 bilat LE doppler negative for thrombus.     Pt continually c/o 10/10 mid sternal chest pain - MD and PA aware. Pain managed with PRN IV dilaudid q8hr - Pt refuse PRN PO tramadol and scheduled lidocaine patch - MD aware.     Plan- continue to monitor chest pain or  difficulty breathing, vitals Q4H; V/Q test 9/21 AM; potential d/c home tomorrow - call MD with questions or concerns.

## 2016-01-07 NOTE — Consults (Signed)
[facility] Cardiology Consultation  PA Spectra [phone] / EP Spectra [phone] / MD Spectra [phone]  [facility] will continue to follow patient going forward     Referring Physician: Dr. Dorthea Cove    Reason for Consultation: NSVT, atypical CP    Assessment and Plan:   NSVT  - report of 14 beat run yesterday afternoon (strip not available for review), but did have another 10 beat run on tele this AM.   - patient denies any palpitations or lightheadedness. Reports no change in his SOB and CP that caused him to come to hospital.   - electrolytes are WNL - magnesium being replaced today (level 1.8) with goal >2. K at goal >4  - continue to monitor on telemetry  - continue BB    Chest pain  - atypical. CP has been constant for 1 week.   - serial troponins flat, negative. No acute ischemic changes on EKG. No ACS event.   - there is at least a component of musculoskeletal etiology as he reports tenderness to palpation of left anterior chest wall.   - denies pleuritic or positional component, but d-dimer was elevated. Unable to get CTPA due to poor IV access. Medicine planning for VQ scan. Recent CTPA on 12/17/15 with no PE.     CAD - reportedly non-obstructive via LHC 2015 at [facility]   - in further review of chart, there is possible hx of stents in past ?2010.   - last echo 12/18/15 with moderate diffuse hypokinesis  - will need to obtain cath report to confirm level of CAD / if there was prior stenting.   - continue medical management with BB. Patient reports being told to stop ASA in past by nephrology.   - Not currently on statin. Alk phos elevated on 9/12, but AST/ALT normal. No recent lipid panel. Check lipids in AM and recommend starting statin.     Hypertensive urgency  - BP historically difficult to control.  - BP overall improved, but still uncontrolled at times  - patient denies taking isordil TID and minoxidil BID at home - continue these meds  - He continues on his home medications: Amlodipine 10 mg daily, coreg 25 mg BID,  hydralazine 100 mg TID, and diuresis.   - continue to monitor trend and can uptitrate above medications vs. Consider ACEI or ARB if OK with nephrology if BP still uncontrolled.     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 LVEF 38% with G2DD, moderately dilated RV with reduced systolic function. No significant valvular disease.   - per [facility] Cardiology consult note from 8/30, prior EF was 60%.   - currently with mild fluid overload - continues on IV lasix and recommend continued fluid management with HD. Can likely switch back to PO lasix tomorrow.   - consider addition of ACEI or ARB if OK with nephrology    Prolonged QTC - stable compared to prior EKG. Optimize electrolytes. Avoid QT prolonging meds. Continue to monitor.   ESRD on HD  - nephrology following and assisting with fluid management.   OSA not on CPAP  Anemia of CKD  Hx C. Difficile diarrhea    Discussed extensively with patient and primary RN.   After further review of chart, patient was initially seen by [facility] cardiology on 12/17/15. Will discuss findings with their group who will assume care on subsequent hospital days.   Thank you very much for allowing [facility] to participate in the  care of [name].          Chief Complaint: Chest pain + SOB    History of Present Illness:  [name] is a 45 yo AA male with the PMH listed below, to include CAD, HFrEF 38%, uncontrolled HTN, ESRD on HD. He was recently hospitalized at [facility] for c/o CP and SOB. He was ruled out for MI and felt to be atypical CP. Patient last seen by our colleague on 9/18 (patient left hospital, ?AMA). He presented to [facility] on 9/19 with continued complaints of CP and SOB. States these did not worsen, they had simply not improved.     CP started 1 week ago at rest while watching TV. Left anterior chest, sharp/squeezing sensation -states, "like someone is sitting on my chest." Had radiation to back and numbness/tingling down the left arm.  He has had associated SOB and nausea. Reports intermittent sweating, but does not seem to be distinctly associated with the CP. Pain is rated at 7-8/10 at minimum and at times is 10/10. Constant since onset 1 week ago. No obvious aggravating or alleviating factors. SL nitro and Nitropaste last week when he presented to [facility] did not help. Prior to this CP event, [name] reports being able to climb a flight of stairs and go grocery shopping without exertional CP. Does have exertional dyspnea at times, but feels this is not unusual and associated with times when he is fluid overloaded.     +orthopnea and peripheral edema. These have improved with continued dialysis sessions.     Patient Active Problem List    Diagnosis Date Noted   . Chest pain 01/06/2016   . Chest pain in adult 12/30/2015   . Acute on chronic congestive heart failure 12/30/2015   . Drug-seeking behavior 12/18/2015   . HAP (hospital-acquired pneumonia) 12/17/2015   . Diarrhea 12/17/2015   . Hospital-acquired pneumonia    . Nausea & vomiting 11/09/2015   . Decreased appetite 11/09/2015   . Ascites 11/09/2015   . Diarrhea of infectious origin 11/09/2015   . Acute pain due to trauma 11/17/2013   . Pulmonary contusion, initial encounter 11/17/2013   . 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 Hypertension 11/17/2013     Past Medical History:   Diagnosis Date   . Asthma without status asthmaticus    . 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 Clostridium difficile diarrhea    . Drug-seeking behavior    . Hypertension    . Kidney failure       Past Surgical History:   Procedure Laterality Date   . AV FISTULA PLACEMENT     . PERITONEAL CATHETER INSERTION        Prescriptions Prior to Admission   Medication Sig Dispense Refill Last Dose   . albuterol (PROVENTIL) (2.5 MG/3ML) 0.083% nebulizer solution Take 2.5 mg by nebulization 2 (two) times daily as needed.       01/05/2016 at Unknown time   . albuterol-ipratropium (COMBIVENT RESPIMAT) 20-100  MCG/ACT Aero Soln Inhale 2 puffs into the lungs daily.       01/05/2016 at Unknown time   . amLODIPine (NORVASC) 10 MG tablet Take 10 mg by mouth daily.       01/05/2016 at Unknown time   . carvedilol (COREG) 25 MG tablet Take 25 mg by mouth 2 (two) times daily.   01/05/2016 at Unknown time   . furosemide (LASIX) 80 MG tablet Take  80 mg by mouth daily.   01/05/2016 at Unknown time   . hydrALAZINE (APRESOLINE) 100 MG tablet Take 100 mg by mouth 3 (three) times daily.   01/05/2016 at Unknown time   . pantoprazole (PROTONIX) 40 MG tablet Take 40 mg by mouth daily.   01/05/2016 at Unknown time   . sevelamer (RENVELA) 800 MG tablet Take 800 mg by mouth 3 (three) times daily with meals.   01/05/2016 at Unknown time     Allergies   Allergen Reactions   . Lisinopril Anaphylaxis   . Morphine Anaphylaxis   . Toradol [Ketorolac Tromethamine] Anaphylaxis      Current Facility-Administered Medications   Medication Dose Route Frequency Provider Last Rate Last Dose   . acetaminophen (TYLENOL) tablet 325 mg  325 mg Oral Q4H PRN Tyrone Schimke, MD   325 mg at 01/06/16 2042   . albuterol (PROVENTIL) nebulizer solution 2.5 mg  2.5 mg Nebulization Q6H PRN Candiss Norse, MD   2.5 mg at 01/07/16 0949   . albuterol (PROVENTIL) nebulizer solution 2.5 mg  2.5 mg Nebulization Q6H WA Rosemarie Beath H, MD       . amLODIPine (NORVASC) tablet 10 mg  10 mg Oral Daily Candiss Norse, MD   10 mg at 01/07/16 1120   . calcium carbonate (TUMS) chewable tablet 1,000 mg  1,000 mg Oral BID Candiss Norse, MD   1,000 mg at 01/07/16 1120   . carvedilol (COREG) tablet 25 mg  25 mg Oral Q12H SCH Candiss Norse, MD   25 mg at 01/07/16 1118   . darbepoetin alfa (ARANESP) injection 100 mcg  100 mcg Subcutaneous Weekly Candiss Norse, MD       . furosemide (LASIX) injection 40 mg  40 mg Intravenous BID Candiss Norse, MD   40 mg at 01/07/16 1118   . heparin (porcine) injection 5,000 Units  5,000 Units Subcutaneous  [facility] Tyrone Schimke, MD   5,000 Units at 01/06/16 2256   . hydrALAZINE (APRESOLINE) injection 10 mg  10 mg Intravenous Q6H PRN Candiss Norse, MD   10 mg at 01/06/16 1803   . hydrALAZINE (APRESOLINE) tablet 100 mg  100 mg Oral Q8H SCH Candiss Norse, MD   100 mg at 01/07/16 9604   . HYDROmorphone (DILAUDID) injection 0.4 mg  0.4 mg Intravenous Q6H PRN Birdena Crandall, MD       . HYDROmorphone (DILAUDID) tablet 4 mg  4 mg Oral Once Tyrone Schimke, MD       . isosorbide dinitrate (ISORDIL) tablet 10 mg  10 mg Oral [facility] Candiss Norse, MD   10 mg at 01/07/16 5409   . lidocaine (LIDODERM) 5 % 1 patch  1 patch Transdermal Q24H Candiss Norse, MD   1 patch at 01/06/16 1502   . minoxidil (LONITEN) tablet 5 mg  5 mg Oral Q12H SCH Candiss Norse, MD   5 mg at 01/07/16 0147   . nitroglycerin (NITROSTAT) SL tablet 0.4 mg  0.4 mg Sublingual Q5 Min PRN Candiss Norse, MD       . ondansetron [facility]) injection 4 mg  4 mg Intravenous Q4H PRN Candiss Norse, MD   4 mg at 01/06/16 1502   . pantoprazole (PROTONIX) EC tablet 40 mg  40 mg Oral Daily Candiss Norse, MD   40 mg at 01/07/16 1120   . sevelamer (RENVELA) tablet 800 mg  800 mg Oral  TID MEALS Candiss Norse, MD   800 mg at 01/07/16 1120   . sodium chloride 0.9 % bolus 100 mL  100 mL Intravenous Q1H PRN Burnis Medin, MD       . sodium chloride 0.9 % bolus 250 mL  250 mL Intravenous PRN Burnis Medin, MD       . traMADol Janean Sark) tablet 50 mg  50 mg Oral Q6H PRN Birdena Crandall, MD         Social History   Substance Use Topics   . Smoking status: Former Smoker     Packs/day: 0.50     Types: Cigarettes     Quit date: 09/18/2015   . Smokeless tobacco: Former Neurosurgeon   . Alcohol use No        Family History:   Family History   Problem Relation Age of Onset   . Hypertension Mother         Review of Systems  Constitutional:  No weight loss, fever, or night sweats  HEENT:  No vertigo, visual  loss, or diplopia  Cardiovascular:  See HPI  Respiratory:  No cough, sputum, or wheezing. +hx asthma  Gastrointestinal:  +intermittent nausea, no heartburn, abdominal pain, constipation. Reports continued diarrhea.   Genitourinary:  No dysuria, frequency, or urgency  Musculoskeletal:  No back pain, muscular weakness or joint swelling  Endocrine:   No cold intolerance, heat intolerance, or polyuria  Neurological:  No syncope, seizures, +numbness/tingling of left arm with CP last week. No numbness or tingling of feet, unsteady gait or speech difficulties  Skin: No edema, color change or chronic ulcers  Heme: No bruising, no petechiae, no bleeding gums  Psych: Normal affect, normal mood, no suicidal ideation    Objective:   BP (!) 180/92   Pulse 99   Temp 98.1 F (36.7 C) (Oral)   Resp 22   Ht 1.854 m (6\' 1" )   Wt 105.7 kg (233 lb)   SpO2 98%   BMI 30.74 kg/m     General Appearance:    Alert and oriented, cooperative, no distress, obese   Head:    Normocephalic, atraumatic   Eyes:    EOMI, conjunctiva/corneas clear, sclera anicteric   Nose:   Nares normal, septum midline, mucosa normal, no drainage         Neck:   Supple, symmetrical, trachea midline,   Carotids 2+ Bilaterally, JVD difficult to assess due to neck habitus   Lungs:     respirations unlabored, decreased breath sounds in bases. CTAB   Chest wall:    No tenderness or deformity   Heart:    Regular rate and rhythm, S1 and S2 normal, no murmur, no rub or gallop   Abdomen:     Soft, non-tender, bowel sounds active all four quadrants,     no masses, no organomegaly   Extremities:   Extremities warm, atraumatic, no clubbing, cyanosis. Trace bilateral LE pitting edema   Pulses:   2+ and symmetric all extremities, including radial and DP   Skin:   Skin color, texture, turgor normal, no rashes or lesions   Neurologic:   Moving all 4 extremities, exam grossly non focal     Cardiac Data Review  Echo 12/18/15  SUMMARY:  -  Procedure information:  -  The study  included limited 2D imaging, M-mode, and limited spectral Doppler.    -  Left ventricle:  -  Size was at the upper limits of normal. A false tendon  was identified in the  mid cavity. -  Systolic function was moderately reduced. Ejection fraction was  calculated to be 38 % by Simpson's biplane method, 35-40% by visual inspection.  -  There was moderate diffuse hypokinesis.  -  Wall thickness was at the upper limits of normal.  -  Left ventricular diastolic function was indeterminate due to E/A fusion on  the LV inflow Spectral Doppler; there appears to be at least grade II  (moderate) LV diastolic dysfunction.    -  Right ventricle:  -  The ventricle was moderately dilated. Basal diameter = 5.2 cm.  -  Systolic function was reduced.    COMPARISONS:  Compared to the report of the previous study from 11/04/2013, this is a limited  study; four-chamber dilatation is now present; LV ejection fraction was  estimated to be lower in this study; and reduced RV systolic pressure was also  appreciated.      EKG Results     Procedure Component Value Units Date/Time    CARDIAC STRIPS [[id number]] Resulted:  01/06/16 1557     Updated:  01/06/16 1557    ECG 12 lead [[id number]] Collected:  01/07/16 0822     Updated:  01/07/16 1038     Ventricular Rate 95 BPM      Atrial Rate 95 BPM      P-R Interval 172 ms      QRS Duration 80 ms      Q-T Interval 400 ms      QTC Calculation (Bezet) 502 ms      P Axis 63 degrees      R Axis -2 degrees      T Axis 82 degrees     Narrative:       NORMAL SINUS RHYTHM  POSSIBLE LEFT ATRIAL ENLARGEMENT  PROLONGED QT  ABNORMAL ECG  WHEN COMPARED WITH ECG OF 30-Dec-2015 14:25,  NO SIGNIFICANT CHANGE WAS FOUND  Confirmed by Maurine Cane, MD, LEONARD ([id number]) on 01/07/2016 10:38:26 AM    EKG SCAN [[id number]] Resulted:  01/07/16 1039     Updated:  01/07/16 1039          Labs:   Lab Results   Component Value Date    WBC 8.45 01/05/2016    HGB 8.7 (L) 01/05/2016    HCT 27.6 (L) 01/05/2016    MCV 94.2 01/05/2016     PLT 114 (L) 01/05/2016     No results found for: CKTOTAL, CKMB, CKMBINDEX  No results found for: CKTOTAL  Lab Results   Component Value Date    BUN 45.0 (H) 01/06/2016    NA 141 01/06/2016    K 4.5 01/06/2016    CL 105 01/06/2016    CO2 22 01/06/2016       Abd [place] limited 01/05/16  IMPRESSION:    Small to moderate volume of abdominal and pelvic ascites.  Findings appear similar to that seen on 11/10/2015 CT, accounting for differences in technique

## 2016-01-07 NOTE — Progress Notes (Signed)
MEDICINE PROGRESS NOTE    Date Time: 01/07/16 3:26 PM  Patient Name: [name]  Attending Physician: Birdena Crandall, MD    Assessment:   Principal Problem:    Chest pain  Active Problems:    ESRD (end stage renal disease) on dialysis    Hypertension    Cardiac LV ejection fraction 21-40%  Resolved Problems:    * No resolved hospital problems. *      8M with CAD, HTN, ESRD no HD, presented to ED with CP, hypertensive urgency, elevated Ddimer. CTA was cancelled because we were unable to get IV access for procedure. VQ is pending. Troponins on admission were 0.09.  Cardiology has been consulted to rule out ACS. Patient reportedly left AMA from OSH on 01/06/16 after they had stopped his IV pain medicine.     Plan:   #Elevated Ddimer, hx of PE in the past and treated with coumadin for 6 months.   -- CTA cancelled.  RN and vascular access were unsuccessful in placing an 18g IV for procedure. Patient had CTA about 2 weeks ago while admitted here which was negative.   -- VQ scan now ordered.      #ESRD on HD- patient refused HD today. He is complaining of abdominal distention and ascites with fluid overload but still refusing HD.  HE states that he had cramps last night after the treatment.   -- currently on Lasix. Will touch base with nephrology since patient is anuric.     #NSVT- monitor on tele.    #Decreased EF, 38% 11/2015  --unable to add lisinopril due to allergic reaction. Continue BB.     #atypical chest pain- not reproducible on exam.  Troponins normal.   -- decrease frequency of IV pain medications to q 8 hours. Will continue to decrease tomorrow morning.   -- cardiology is signing off. They do not feel this is ACS.      Case discussed with: RN, patient.    Safety Checklist:     DVT prophylaxis:  CHEST guideline (See page [id number]) Chemical and Mechanical   Foley:  [place]ershey Rn Foley protocol Not present   IVs:  Peripheral IV   PT/OT: Ordered   Daily CBC & or Chem ordered:  SHM/ABIM guidelines (see #5) Yes, due to  clinical and lab instability   Reference for approximate charges of common labs: CBC auto diff - $76  BMP - $99  Mg - $79    Lines:     Patient Lines/Drains/Airways Status    Active PICC Line / CVC Line / PIV Line / Drain / Airway / Intraosseous Line / Epidural Line / ART Line / Line / Wound / Pressure Ulcer / NG/OG Tube     Name:   Placement date:   Placement time:   Site:   Days:    Permacath/Temporary Catheter Permacath Left                  Permacath/Temporary Catheter Permacath Left                  Peripheral IV 01/05/16 Right Other (Comment)  01/05/16        Other (Comment)    2    Peripheral IV 01/05/16 Left External Jugular  01/05/16        External Jugular    2    Graft/Fistula Right                Graft/Fistula Left  Disposition: (Please see PAF column for Expected D/C Date)   Today's date: 01/07/2016  Admit Date: 01/06/2016  4:43 AM  LOS: 1  Clinical Milestones: VQ scan, cardiology consult  Anticipated discharge needs: tbd.      Subjective     CC: Chest pain    Interval History/24 hour events: no edema.    HPI/Subjective: Patient denies     Review of Systems:     See HPI    Physical Exam:     VITAL SIGNS PHYSICAL EXAM   Temp:  [97.3 F (36.3 C)-99.5 F (37.5 C)] 98.1 F (36.7 C)  Heart Rate:  [88-106] 99  Resp Rate:  [16-28] 22  BP: (155-212)/(76-112) 180/92  Blood Glucose: 98    Telemetry: sinus.       Intake/Output Summary (Last 24 hours) at 01/07/16 1526  Last data filed at 01/06/16 1935   Gross per 24 hour   Intake                0 ml   Output             4000 ml   Net            -4000 ml    Physical Exam  General: awake, alert X 3  Cardiovascular: regular rate and rhythm, no murmurs, rubs or gallops  Lungs: clear to auscultation bilaterally, without wheezing, rhonchi, or rales  Abdomen: soft, non-tender, abd is distended due to volume overload; no palpable masses,  normoactive bowel sounds  Extremities: +1 edema bilateral extremities, pulses +1. LUE with forearm  scarring from fistula procedure.        Meds:     Medications were reviewed:    Current Facility-Administered Medications   Medication Dose Route Frequency   . albuterol  2.5 mg Nebulization Q6H WA   . amLODIPine  10 mg Oral Daily   . calcium carbonate  1,000 mg Oral BID   . carvedilol  25 mg Oral Q12H SCH   . darbepoetin alfa  100 mcg Subcutaneous Weekly   . furosemide  40 mg Intravenous BID   . heparin (porcine)  5,000 Units Subcutaneous Q8H SCH   . hydrALAZINE  100 mg Oral Q8H SCH   . HYDROmorphone  4 mg Oral Once   . isosorbide dinitrate  10 mg Oral Q8H SCH   . lidocaine  1 patch Transdermal Q24H   . minoxidil  5 mg Oral Q12H SCH   . pantoprazole  40 mg Oral Daily   . sevelamer  800 mg Oral TID MEALS     Current Facility-Administered Medications   Medication Dose Route Frequency Last Rate     Current Facility-Administered Medications   Medication Dose Route   . acetaminophen  325 mg Oral   . albuterol  2.5 mg Nebulization   . hydrALAZINE  10 mg Intravenous   . HYDROmorphone  0.4 mg Intravenous   . nitroglycerin  0.4 mg Sublingual   . ondansetron  4 mg Intravenous   . traMADol  50 mg Oral       Labs:     Labs were reviewed:    Labs (last 72 hours):      Recent Labs  Lab 01/05/16  0543 01/02/16  1044   WBC 8.45 8.61   Hgb 8.7* 7.5*   Hematocrit 27.6* 24.2*   Platelets 114* 116*            Recent Labs  Lab 01/06/16  1620 01/06/16  1610  01/02/16  1044   Sodium 141 141 More results in Results Review 144   Potassium 4.5 4.5 More results in Results Review 4.6   Chloride 105 104 More results in Results Review 104   CO2 22 22 More results in Results Review 23   BUN 45.0* 42.0* More results in Results Review 56*   Creatinine 7.9* 7.6* More results in Results Review 8.0*   Calcium 5.6* 6.1* More results in Results Review 5.5*   Albumin  --   --   --  2.6*   Glucose 98 78 More results in Results Review 108*   More results in Results Review = values in this interval not displayed.                Microbiology, reviewed and  are significant for:  Microbiology Results     None          Imaging, reviewed and are significant for:  US Venous Duplex Doppler Leg Bilateral    Result Date: 01/07/2016   No evidence for DVT. Charlott Rakes, MD 01/07/2016 2:43 PM         Signed by: Clydie Braun, PA

## 2016-01-07 NOTE — Progress Notes (Signed)
01/07/16 0949   Patient Assessment   Status Completed   RT Priority 2   Subjective (feels SOB)   Pulmonary History Asthma  (former smoker)   Airway type (natural)   Mobility Ambulatory with or without assistance   CXR none current   Cough Spontaneous;Non-productive   Heart Rate 98   Resp Rate (!) 24   Bilateral Breath Sounds Diminished   R Breath Sounds Diminished   L Breath Sounds Diminished   Location Specific No   Home regimen   Home Treatments y  (combivent, albuterol)   Home Oxygen y  (2L)   Home CPAP/BiLevel y   Oxygen Therapy   SpO2 100 %   O2 Device Nasal cannula   O2 Flow Rate (L/min) 2.5 L/min   Incentive Spirometry   Incentive Spirometry Achieved (mL) (NA)   Criteria for Therapy   Secretion Clearance (BPH) None   Lung Expansion None   Respiratory Medications New or pre-existing diagnosed airways disease;Patient receives maintenance therapy at home   Oxygen Mild/Moderate: O2 < or equal to 5 lpm NC to maintain SpO2> or equal to 90%   Severity Index   Severity Index MILD   Outcomes   Secretion Clearance (BPH)  (n/a)   Lung expansion  (n/a)   Respiratory medication Decreased wheezing, air movement or symptoms   Oxygen goals Goals not met   Recommendations   Secretion Clearance (BPH) (n/a)   Lung Expansion (n/a)   Respiratory Medications Increase therapy   Performing Departments   O2 Device performing department formula [id number]   Resp assess adult performing department formula [id number]   Nebs given performing department formula [id number]     Change albuterol 2.5 Q6 PRN to Q6WA + Q6 PRN per respiratory patient driven protocol.

## 2016-01-07 NOTE — UM Notes (Signed)
Remains in Observation status     9/19Labs:  BUN 42.0, 45.0, Cr 7.6, 7.9, Ca 6.1, 5.6, D-dimer 3.98, CK 937, 849    9/20 VS: T 98.4, HR 88, RR 20, bp 173/88, Pox 99% on 2L O2    9/20 Remains on Cardiac Telemetry Unit  Cardiac tele  HD   Aranesp weekly  Lasix 40mg  IV bid          Lloyd Huger, BSN, RN, ACM          Utilization Review Case Manager  [facility]  ph: [phone]

## 2016-01-07 NOTE — Progress Notes (Addendum)
Pt refused AM blood draws.  Also denies heparin SQ shot.

## 2016-01-08 ENCOUNTER — Observation Stay: Payer: Medicare Other

## 2016-01-08 ENCOUNTER — Encounter (INDEPENDENT_AMBULATORY_CARE_PROVIDER_SITE_OTHER): Payer: Self-pay

## 2016-01-08 LAB — MAGNESIUM: Magnesium: 1.7 mg/dL (ref 1.6–2.6)

## 2016-01-08 LAB — CBC
Absolute NRBC: 0 10*3/uL
Hematocrit: 26.9 % — ABNORMAL LOW (ref 42.0–52.0)
Hgb: 8.3 g/dL — ABNORMAL LOW (ref 13.0–17.0)
MCH: 29.2 pg (ref 28.0–32.0)
MCHC: 30.9 g/dL — ABNORMAL LOW (ref 32.0–36.0)
MCV: 94.7 fL (ref 80.0–100.0)
MPV: 9.9 fL (ref 9.4–12.3)
Nucleated RBC: 0 /100 WBC (ref 0.0–1.0)
Platelets: 134 10*3/uL — ABNORMAL LOW (ref 140–400)
RBC: 2.84 10*6/uL — ABNORMAL LOW (ref 4.70–6.00)
RDW: 17 % — ABNORMAL HIGH (ref 12–15)
WBC: 6.98 10*3/uL (ref 3.50–10.80)

## 2016-01-08 LAB — LIPID PANEL
Cholesterol / HDL Ratio: 3.3
Cholesterol: 155 mg/dL (ref 0–199)
HDL: 47 mg/dL (ref 40–9999)
LDL Calculated: 84 mg/dL (ref 0–99)
Triglycerides: 119 mg/dL (ref 34–149)
VLDL Calculated: 24 mg/dL (ref 10–40)

## 2016-01-08 LAB — HEMOLYSIS INDEX: Hemolysis Index: 6 (ref 0–18)

## 2016-01-08 LAB — TROPONIN I: Troponin I: 0.06 ng/mL (ref 0.00–0.09)

## 2016-01-08 MED ORDER — ACETAMINOPHEN 325 MG PO TABS
650.0000 mg | ORAL_TABLET | ORAL | Status: DC | PRN
Start: 2016-01-08 — End: 2016-01-08

## 2016-01-08 MED ORDER — ALBUTEROL SULFATE (2.5 MG/3ML) 0.083% IN NEBU
2.5000 mg | INHALATION_SOLUTION | RESPIRATORY_TRACT | Status: DC | PRN
Start: 2016-01-08 — End: 2016-01-08
  Administered 2016-01-08: 2.5 mg via RESPIRATORY_TRACT
  Filled 2016-01-08: qty 3

## 2016-01-08 MED ORDER — TRAMADOL HCL 50 MG PO TABS
50.0000 mg | ORAL_TABLET | Freq: Four times a day (QID) | ORAL | 0 refills | Status: DC | PRN
Start: 2016-01-08 — End: 2016-01-25

## 2016-01-08 MED ORDER — DARBEPOETIN ALFA 100 MCG/0.5ML IJ SOSY
100.0000 ug | PREFILLED_SYRINGE | INTRAMUSCULAR | Status: DC
Start: 2016-01-14 — End: 2016-01-25

## 2016-01-08 MED ORDER — CALCIUM CARBONATE ANTACID 500 MG PO CHEW
1000.0000 mg | CHEWABLE_TABLET | Freq: Two times a day (BID) | ORAL | Status: DC
Start: 2016-01-08 — End: 2016-02-06

## 2016-01-08 MED ORDER — ACETAMINOPHEN 325 MG PO TABS
650.0000 mg | ORAL_TABLET | ORAL | Status: AC | PRN
Start: 2016-01-08 — End: ?

## 2016-01-08 MED ORDER — TECHNETIUM TC 99M PENTETATE AEROSOL
32.4000 | Freq: Once | Status: AC | PRN
Start: 2016-01-08 — End: 2016-01-08
  Administered 2016-01-08: 32 via RESPIRATORY_TRACT
  Filled 2016-01-08: qty 75

## 2016-01-08 MED ORDER — ALBUTEROL SULFATE HFA 108 (90 BASE) MCG/ACT IN AERS
2.0000 | INHALATION_SPRAY | RESPIRATORY_TRACT | Status: DC
Start: 2016-01-08 — End: 2016-01-08
  Administered 2016-01-08: 2 via RESPIRATORY_TRACT
  Filled 2016-01-08: qty 1

## 2016-01-08 MED ORDER — TECHNETIUM TC 99M ALBUMIN AGGREGATED
5.6000 | Freq: Once | Status: AC | PRN
Start: 2016-01-08 — End: 2016-01-08
  Administered 2016-01-08: 6 via INTRAVENOUS
  Filled 2016-01-08: qty 10

## 2016-01-08 MED ORDER — MINOXIDIL 2.5 MG PO TABS
5.0000 mg | ORAL_TABLET | Freq: Two times a day (BID) | ORAL | 0 refills | Status: DC
Start: 2016-01-08 — End: 2016-01-25

## 2016-01-08 NOTE — Plan of Care (Signed)
Late entry due to EPIC down time.  Pt requested to see practitioner.  Re: unhappy with pain meds schedule. He states that the q8h schedule is too long for the IV Dilaudid.  He states that he has chest pain and bilateral arm pain/swelling.  He was told it was not his heart but he can not be discharged with the that kind of pain.  Pt  States that the reason that he refused HD was because "they try to remove too much fluid from me and got me cramping".  Pt agree to only get one early dose of his IV Dilaudid and to discuss his pain management with the rounding team.   RN Lysle Morales was also at bedside during discussion.    Karlene Lineman, NP

## 2016-01-08 NOTE — Progress Notes (Signed)
After discontinue IV dilaudid, pt refused scheduled medication and wants to [facility]. AMA form signed by pt. Notified charge nurse and attending. Pt refused to receive AVS. Only wants to receive transportation to [facility]. Notified case manager and waiting for response now.

## 2016-01-08 NOTE — Progress Notes (Signed)
[facility] ([facility])    Received a referral to schedule a follow up appointment with the [facility].  Appointment scheduled for Saturday 01/10/16 at 8:30AM at [facility] location.      Clinic address is as follows:    [address]. [place], [address]      Please notify patient to arrive 15 minutes early to the appointment and bring the following materials with them:    Insurance card (if insured) and photo ID  Medications in their original bottles  Glucometer/blood sugar log (if diabetic)  Weight log (if heart failure)  Proof of income (to enroll in medication assistance programs-first two pages of signed 1040 tax forms or last 2 months of pay stubs)      Terressa Koyanagi  Transitional Services   Sched Reg Rep II  T [phone]  F [phone]

## 2016-01-08 NOTE — Progress Notes (Signed)
01/08/16 0145   Patient Assessment   Status Completed   RT Priority 2   Subjective SOB   Pulmonary History Asthma   Surgical Status None   Mobility Ambulatory with or without assistance   CXR None here   Cough Non-productive   Bilateral Breath Sounds Diminished   Home regimen   Home Treatments yes  (Albuterol MDI 4- 5 times a day per patient)   Home Oxygen yes   Home CPAP/BiLevel no   Oxygen Therapy   O2 Device Nasal cannula   O2 Flow Rate (L/min) 3 L/min   Criteria for Therapy   Respiratory Medications New or pre-existing diagnosed airways disease;Patient receives maintenance therapy at home   Severity Index   Severity Index MILD   Outcomes   Respiratory medication Decreased wheezing, air movement or symptoms   Oxygen goals Goals met   Recommendations   Respiratory Medications Increase therapy  (change to MDI per patient request)   Performing Departments   O2 Device performing department formula [id number]   Resp assess adult performing department formula [id number]   Nebs given performing department formula [id number]     Patient with increased symptoms of SOB with diminished breath sounds, no wheezing.  Home regimen is Albuterol MDI 4-5 times a day and wants to receive medication via MDI.  Per protocol, medication frequency and mode of therapy changed to 2 puffs Albuterol MDI q4 hr wa.

## 2016-01-08 NOTE — Progress Notes (Signed)
Patient has been requesting Albuterol neb for SOB approx. Q4hr.

## 2016-01-08 NOTE — Discharge Instr - AVS First Page (Signed)
[facility] - Discharge Instructions    I was hospitalized because: left chest pain, non cardiac.     The next medical provider I need to see is: Your family doctor in 3 days,  Please also see Dr. Verdell Face as soon as possible to discuss volume removal with HD, and beginning Aranesp injections for anemia.     I need to seek medical attention if:  Worsening shortness of breath, chest pressure, dizziness, lightheadedness, systolic BP above 170.     Diet: resume diet low in salt and monitor fluid intake. Please limit fluids to less than 2L daily.     Activity: resume activity in your home as tolerated.     If you have any urgent concerns or questions about your care that cannot be addressed by your primary care provider or the provider listed above, please contact the physician listed below:    Your attending physician (at discharge): Birdena Crandall, MD  Hospitalist, [facility]  [phone]

## 2016-01-08 NOTE — Discharge Summary (Signed)
Discharge Summary    Date:01/08/2016   Patient Name: [name]  Attending Physician: Birdena Crandall, MD    Date of Admission:   01/06/2016    Date of Discharge:   01/08/16- left AMA    Admitting Diagnosis:   Chest pain.     Discharge Dx:     Principal Diagnosis (Diagnosis after study, that is chiefly responsible for admission to inpatient status): Chest pain  Active Hospital Problems    Diagnosis POA   . Principal Problem: Chest pain Yes   . 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 Hypertension Yes     Chronic   . ESRD (end stage renal disease) on dialysis Not Applicable     Chronic      Resolved Hospital Problems    Diagnosis POA   No resolved problems to display.       Treatment Team:   Treatment Team:   Attending Provider: Birdena Crandall, MD  Consulting Physician: Louellen Molder, MD  Consulting Physician: Darrol Poke, MD  Consulting Physician: Cheri Kearns, MD     Procedures performed:   Radiology: all results from this admission  Ct Angio Chest (pe Or Trauma Protocol)    Result Date: 12/17/2015  No main pulmonary emboli. Evaluation of the lobar, segmental and subsegmental branches is markedly suboptimal due to motion artifact. Multiple subcentimeter nodular opacities are seen in the right upper lung, possibly pneumonia, new. Trace right and small left pleural effusions, new. Atelectasis at the lung bases. Mediastinal and mild right axillary lymphadenopathy. Correlate clinically for etiology. 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 Johnsie Kindred, MD 12/17/2015 5:38 AM     Nm Lung Ventilation Perfusion Aerosol    Result Date: 01/08/2016   Low probability for pulmonary embolism. Collene Schlichter, MD 01/08/2016 9:32 AM     Xr Chest  Ap Portable    Result Date: 12/30/2015   No active disease Kinnie Feil, MD 12/30/2015 2:50 PM     Xr Chest  Ap Portable    Result Date: 12/17/2015  1. Mild basilar atelectasis with probable small bilateral pleural effusions. 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 No findings to suggest acute DVT. Dimitrios  Papadouris, MD 01/01/2016 6:05 PM     US Venous Duplex Doppler Leg Bilateral    Result Date: 01/07/2016   No evidence for DVT. Charlott Rakes, MD 01/07/2016 2:43 PM     US Abdomen Limited Single Organ    Result Date: 01/05/2016   Small to moderate volume of abdominal and pelvic ascites. Findings appear similar to that seen on 11/10/2015 CT, accounting for differences in technique Laurena Slimmer, MD 01/05/2016 8:30 AM     Surgery: all results from this admission  * No surgery found [facility] Course:     23M with CAD, HTN, ESRD no HD, presented to ED with CP, hypertensive urgency, elevated Ddimer. CTA was cancelled because we were unable to get IV access for procedure. VQ is pending. Troponins on admission were 0.09.  Cardiology has been consulted to rule out ACS. Patient reportedly left AMA from OSH on 01/06/16 after they had stopped his IV pain medicine. IV dilaudid was  decreased to q8 hours. Patient was upset however stayed overnight for VQ scan.  Results were not back yet when patient left AMA because IV dilaudid was discontinued.  Patient started to complain of vomiting to justify need for IV pain medicine however reports of any nausea or any issue finishing his food were not upheld by nursing staff. VQ scan came back with low probability for PE later in the morning.     Condition at Discharge:   Stable.     Today:     BP 140/83   Pulse (!) 109   Temp 97.8 F (36.6 C) (Oral)   Resp 20   Ht 1.854 m (6\' 1" )   Wt 105.7 kg (233 lb)   SpO2 99%   BMI 30.74 kg/m      Gen: NAD, ao x 3  Heart: rrr, no murmurs or rubs. No change in pain with palpation or shoulder rotation.   Lungs: CTA, no wheezing or rhonchi.   Abd: soft, slightly distended  (per patient), normal bowel sounds.   Extrems: +1 edema (patient refused HD yesterday), pulses +1.    Ranges for the last 24 hours:  Temp:  [97.3 F (36.3 C)-98.1 F (36.7 C)] 97.8 F (36.6 C)  Heart Rate:  [86-109] 109  Resp Rate:  [14-24] 20  BP: (115-180)/(70-92) 140/83    Last set of labs     Recent Labs  Lab 01/08/16  0743   WBC 6.98   Hgb 8.3*   Hematocrit 26.9*   Platelets 134*       Recent Labs  Lab 01/06/16  1620   Sodium 141   Potassium 4.5   Chloride 105   CO2 22   BUN 45.0*   Creatinine 7.9*   EGFR 9.0   Glucose 98   Calcium 5.6*       Recent Labs  Lab 01/02/16  1044   Albumin 2.6*               Invalid input(s): FREET4        Recent Labs  Lab 01/08/16  0743 01/06/16  1620 01/06/16  0721   Creatine Kinase (CK)  --  849* 937*   Troponin I 0.06 0.09 0.09     Microbiology Results     None          Micro / Labs / Path pending:     Unresulted Labs     Procedure . . . Date/Time    Lipid panel [id number] Collected:  01/08/16 0743    Specimen:  Blood Updated:  01/08/16 0823    Narrative:       Fasting specimen          Discharge Instructions:     Follow-up Information     Cherene Julian, MD.    Specialty:  Nephrology  Why:  Please call and schedule hospital follow up appointment. Please bring discharge instructions and medication list to appointment.  Contact information:  [address]  [place] [address]  [phone]             Delaney Meigs, MD. Schedule an appointment as soon as possible for a visit in 1 week(s).    Specialties:  Cardiology, Internal Medicine  Why:  cardiology follow up  Contact information:  [address]  [place] [address]  [phone]             Dione Housekeeper, MD .    Specialty:  Nephrology  Why:  resume your usual dialysis schedule. Please inquire about starting Aranesp injections for anemia.   Contact information:  [address]  [place] [address]  [phone]                     [facility] - Discharge  Instructions    I was hospitalized because: left chest pain, non cardiac.     The next medical provider I need to see is: Your family doctor in 3 days,  Please also see Dr. Verdell Face as soon as possible to discuss volume removal with HD, and beginning Aranesp injections for anemia.     I need to seek medical attention if:  Worsening shortness of breath, chest pressure, dizziness, lightheadedness, systolic BP above 170.     Diet: resume diet low in salt and monitor fluid intake. Please limit fluids to less than 2L daily.     Activity: resume activity in your home as tolerated.     If you have any urgent concerns or questions about your care that cannot be addressed by your primary care provider or the provider listed above, please contact the physician listed below:    Your attending physician (at discharge): Birdena Crandall, MD  Hospitalist, [facility]  [phone]             Disposition:  Patient left against medical advice.        Discharge Medication List      Taking    acetaminophen 325 MG tablet  Dose:  650 mg  Commonly known as:  TYLENOL  Take 2 tablets (650 mg total) by mouth every 4 (four) hours as needed for Pain.     albuterol (2.5 MG/3ML) 0.083% nebulizer solution  Dose:  2.5 mg  Commonly known as:  PROVENTIL  Take 2.5 mg by nebulization 2 (two) times daily as needed.     albuterol-ipratropium 20-100 MCG/ACT Aers  Dose:  2 puff  Commonly known as:  COMBIVENT RESPIMAT  For:  sob prn  Inhale 2 puffs into the lungs daily.     amLODIPine 10 MG tablet  Dose:  10 mg  Commonly known as:  NORVASC  For:  High Blood Pressure Disorder  Take 10 mg by mouth daily.     calcium carbonate 500 MG chewable tablet  Dose:  1000 mg  Commonly known as:  TUMS  Chew 2 tablets (1,000 mg total) by mouth 2 (two) times daily.     carvedilol 25 MG tablet  Dose:  25 mg  Commonly known as:  COREG  Take 25 mg by mouth 2 (two) times daily.     darbepoetin alfa 100 MCG/0.5ML injection  Dose:  100 mcg  Commonly known as:   ARANESP  Start taking on:  01/14/2016  Inject 0.5 mLs (100 mcg total) into the skin once a week.     furosemide 80 MG tablet  Dose:  80 mg  Commonly known as:  LASIX  For:  Edema  Take 80 mg by mouth daily.     hydrALAZINE 100 MG tablet  Dose:  100 mg  Commonly known as:  APRESOLINE  For:  High Blood Pressure Disorder  Take 100 mg by mouth 3 (three) times daily.     minoxidil 2.5 MG tablet  Dose:  5 mg  Commonly known as:  LONITEN  Take 2 tablets (5 mg total) by mouth every 12 (twelve) hours.  pantoprazole 40 MG tablet  Dose:  40 mg  Commonly known as:  PROTONIX  Take 40 mg by mouth daily.     sevelamer 800 MG tablet  Dose:  800 mg  Commonly known as:  RENVELA  Take 800 mg by mouth 3 (three) times daily with meals.     traMADol 50 MG tablet  Dose:  50 mg  Commonly known as:  ULTRAM  Take 1 tablet (50 mg total) by mouth every 6 (six) hours as needed for Pain.          Minutes spent coordinating discharge and reviewing discharge plan: see attending attestation.       Signed by: Clydie Braun, PA

## 2016-01-08 NOTE — Progress Notes (Signed)
Pt refused both Darbepoetin alfa & heparin SQ injections this early am.  Pt c/o of 10/10 pain request to talk w/covering NP Dany Dalencourt to receive additional pain meds.  NP Dalencourt gave 1-time order of 0.4mg  IV dilaudid which was given. Will continue to monitor.

## 2016-01-08 NOTE — UM Notes (Signed)
Observation CSR    9/20 A/P per Medicine:  # Atypical Chest pain in patient with h/o ACS  - troponins are stable, no EKG changes  - cardiology consult  - continue heart meds  - Decrease IV dilaudid to Q8H PRN and add tramadol PRN  # Dyspnea  - likely secondary to fluid overload from ESRD  - patient is refusing HD, he was educated but only asking for pain meds  - continue lasix and HD per Nephrology  Dispo: likely home tomorrow after Nephro and cardio evaluation    9/21 T 97.8, HR 95, RR 20, bp 177/88, Pox 99% on 2-3L O2    9/21 Remains on Cardiac Telemetry Unit  Cardiac tele  Lasix 40mg  IV bid  Aranesp sq weekly          Lloyd Huger, BSN, RN, ACM          Utilization Review Case Manager  [employer]  ph: [phone]

## 2016-01-08 NOTE — Progress Notes (Signed)
The pt will be discharging home with no skilled needs. A TCM f/u apt has been scheduled for 01/10/16 @ 830 am. Information has been added to AVS.     [facility] taxi has been approved for transport to [facility], where his car is located. The pt does not have any friends/family that can pick him up and he has no cash to pay for transport to his car.     D/c checklist is completed.     Systems Case Management Progress Note    [employer]   Time of pick-up 10:30 am   Cost $85.00   Financial Assessment completed per S[employer] services policy Yes   Comments    [facility] Approved by: Lourena Simmonds       Case Management has verified that transportation arrangements are not readily accessible or affordable to the patient/family.     Shirlean Mylar, MSW  Case Management  [facility]  [facility]  [phone]

## 2016-01-08 NOTE — Progress Notes (Signed)
[facility], [employer].  Progress Note    Assessment:    45 y.o male with HTN, ESRD on HD, HLD, CAD, OSA, is admitted with chest pain and hypertensive urgency     -ESRD on HD MWF; has been very noncompliant with HD during his stay.  Cut his treatment short by 1 hour on 9/19 and refused HD 9/20  -Hypertension with CKD; likely volume driven   -CAD; enzymes negative   -Anemia of CKD  -Hyperphosphatemia     Plan:    -HD today and in am if he agrees  -agree with medicine by adding an ACEI; he is not our patient and goes to a dialysis unit in [place]  -supportive care   -labs   -I think his BP will be much better controlled if his dry weight is adjusted downward and if he stays compliant with RRT    Burnis Medin, MD  Office - [phone]  [employer] [phone]  ++++++++++++++++++++++++++++++++++++++++++++++++++++++++++++++  Subjective:  No new complaints    Medications:  Scheduled Meds:  Current Facility-Administered Medications   Medication Dose Route Frequency   . albuterol  2 puff Inhalation Q4H WA   . amLODIPine  10 mg Oral Daily   . calcium carbonate  1,000 mg Oral BID   . carvedilol  25 mg Oral Q12H SCH   . darbepoetin alfa  100 mcg Subcutaneous Weekly   . furosemide  40 mg Intravenous BID   . heparin (porcine)  5,000 Units Subcutaneous Q8H SCH   . hydrALAZINE  100 mg Oral Q8H SCH   . isosorbide dinitrate  10 mg Oral Q8H SCH   . lidocaine  1 patch Transdermal Q24H   . minoxidil  5 mg Oral Q12H SCH   . pantoprazole  40 mg Oral Daily   . sevelamer  800 mg Oral TID MEALS     Continuous Infusions:   PRN Meds:acetaminophen, albuterol, hydrALAZINE, nitroglycerin, ondansetron, traMADol    Objective:  Vital signs in last 24 hours:  Temp:  [97.3 F (36.3 C)-98.1 F (36.7 C)] 97.8 F (36.6 C)  Heart Rate:  [86-99] 95  Resp Rate:  [14-24] 20  BP: (115-180)/(70-92) 177/88  Intake/Output last 24 hours:    Intake/Output Summary (Last 24 hours) at 01/08/16 0932  Last data filed at  01/08/16 0800   Gross per 24 hour   Intake             1122 ml   Output                0 ml   Net             1122 ml     Intake/Output this shift:  I/O this shift:  In: 222 [P.O.:222]  Out: -     Physical Exam:   Gen: WD WN NAD   CV: S1 S2 N RRR   Chest: CTAB   Ab: ND NT soft no HSM +BS   Ext: No C/E    Labs:    Recent Labs  Lab 01/08/16  0743 01/06/16  1620 01/06/16  0721 01/05/16  0544  01/02/16  1044   Glucose  --  98 78 91  --  108*   BUN  --  45.0* 42.0* 63*  --  56*   Creatinine  --  7.9* 7.6* 9.2*  --  8.0*   Calcium  --  5.6* 6.1* 6.4*  --  5.5*   Sodium  --  141 141 141  --  144   Potassium  --  4.5 4.5 5.9*  --  4.6   Chloride  --  105 104 103  --  104   CO2  --  22 22 23   --  23   Albumin  --   --   --   --   --  2.6*   Phosphorus  --  5.8*  --  7.6*  --  6.6*   Magnesium 1.7 1.8 1.9 2.1 More results in Results Review  --    More results in Results Review = values in this interval not displayed.    Recent Labs  Lab 01/08/16  0743 01/05/16  0543 01/02/16  1044   WBC 6.98 8.45 8.61   Hgb 8.3* 8.7* 7.5*   Hematocrit 26.9* 27.6* 24.2*   MCV 94.7 94.2 94.2   MCH 29.2 29.7 29.2   MCHC 30.9* 31.5* 31.0*   RDW 17* 17* 17*   MPV 9.9 10.3 10.7   Platelets 134* 114* 116*

## 2016-01-10 ENCOUNTER — Ambulatory Visit (INDEPENDENT_AMBULATORY_CARE_PROVIDER_SITE_OTHER): Payer: Self-pay | Admitting: Nurse Practitioner

## 2016-01-11 DIAGNOSIS — I429 Cardiomyopathy, unspecified: Secondary | ICD-10-CM

## 2016-01-13 ENCOUNTER — Telehealth: Payer: Self-pay

## 2016-01-13 NOTE — Discharge Summary (Signed)
DISCHARGE SUMMARY      Date Time: 01/13/16 11:47 AM  Patient Name: [name]  Attending Physician: No att. providers found  Primary Care Physician: Christa See, MD    Date of Admission:   12/30/2015    Date of Discharge:   01/05/2016    Discharge Dx:   Active Problems:    Chest pain in adult    Acute on chronic congestive heart failure  Resolved Problems:    * No resolved hospital problems. *      Consultations:   Treatment Team: Consulting Physician: Darlyne Russian, MD; Registered Nurse: Rod Can, RN     Procedures/Radiology performed:     CBC    Recent Labs  Lab 01/08/16  0743   WBC 6.98   Hgb 8.3*   Hematocrit 26.9*   Platelets 134*       CMP    Recent Labs  Lab 01/08/16  0743 01/06/16  1620   Sodium  --  141   Potassium  --  4.5   Chloride  --  105   CO2  --  22   BUN  --  45.0*   Creatinine  --  7.9*   Glucose  --  98   Calcium  --  5.6*   Magnesium 1.7 1.8   Phosphorus  --  5.8*       Lipid panel    Recent Labs  Lab 01/08/16  0743   Cholesterol 155   Triglycerides 119   HDL 47   LDL Calculated 84             Cardiac enzymes    Recent Labs  Lab 01/08/16  0743 01/06/16  1620   Creatine Kinase (CK)  --  849*   Troponin I 0.06 0.09       Ct Angio Chest (pe Or Trauma Protocol)    Result Date: 12/17/2015  TECHNIQUE: CT of the chest WITH contrast. ACR standard PE protocol. 90 mL IV Omnipaque 350 was administered. Coronal and sagittal reconstructed MIP images are reviewed. The following dose reduction techniques were utilized: Automated exposure control and/or adjustment of the mA and/or kV according to patient size, and the use of iterative reconstruction technique. INDICATION: Chest pain. Sinus tachycardia. COMPARISON: Outside CT chest 11/16/2013 FINDINGS: LINES/TUBES: Left internal jugular catheter terminating at the cavoatrial junction. 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 Multiple collateral vessels are seen in the soft tissues of the right upper chest.  LUNGS/PLEURA: Multiple subcentimeter nodular opacities are seen in the right upper lung, possibly pneumonia, new. Mild paraseptal emphysematous changes. Calcified granuloma in the right lower lung. Trace right and small left pleural effusions, new. Atelectasis at the lung bases. No edema. No pneumothorax.  HEART: Not enlarged. MEDIASTINUM:  Mediastinal adenopathy, largest is a right paratracheal lymph node measuring 2.2 x 1.5 cm. Mild right axillary lymphadenopathy, largest measuring 1.3 x 1.1 cm. No hilar or left axillary adenopathy. PULMONARY ARTERIES: No main pulmonary emboli. Evaluation of the lobar, segmental and subsegmental branches is markedly suboptimal due to motion artifact. AORTA:  No aneurysm or dissection. UPPER ABDOMEN:  Bilateral renal atrophy. Ascites is seen in the upper abdomen. Cardiomegaly. Mitral annulus and coronary artery calcifications. No pericardial effusion. BONES AND SOFT TISSUES:  Soft tissue edema. Multiple collateral vessels are seen in the soft tissues of the right upper chest.     No main pulmonary emboli. Evaluation of the lobar, segmental and subsegmental branches is markedly  suboptimal due to motion artifact. Multiple subcentimeter nodular opacities are seen in the right upper lung, possibly pneumonia, new. Trace right and small left pleural effusions, new. Atelectasis at the lung bases. Mediastinal and mild right axillary lymphadenopathy. Correlate clinically for etiology. 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 Johnsie Kindred, MD 12/17/2015 5:38 AM     Nm Lung Ventilation Perfusion Aerosol    Result Date: 01/08/2016  CLINICAL HISTORY: Chest pain. FINDINGS: A ventilation scan was performed with 32 mCi of technetium 66m DTPA aerosol and a perfusion scan was performed with 6 mCi of Tc 74m MAA injected intravenously. Multiple matched ventilation/perfusion images of the lungs were obtained in different projections. Correlation is made with the chest  radiograph(s) dated    . Images demonstrate mild heterogeneous tracer distribution within the lungs bilaterally on both perfusion and ventilation. No unmatched segmental or subsegmental perfusion defects are identified.      Low probability for pulmonary embolism. Collene Schlichter, MD 01/08/2016 9:32 AM     Xr Chest  Ap Portable    Result Date: 12/30/2015  INDICATION: Shortness of breath COMPARISON: 12/17/2015 FINDINGS:  A single radiograph of the chest performed. Portable film. The line is seen in good position in the SVC. A right subclavian artery stent is present. The heart is enlarged and unchanged in size. The mediastinal and hilar structures are within normal limits. The lung fields are clear. No pleural effusion. No pneumothorax.  The  visualized osseous structures demonstrates no acute abnormality.      No active disease Kinnie Feil, MD 12/30/2015 2:50 PM     Xr Chest  Ap Portable    Result Date: 12/17/2015  HISTORY: Chest pain Portable image of the chest shows dialysis catheter tip extending to the superior vena cava. Innominate stent is present. Heart size is enlarged. Vascular pattern is normal. There is minimal basilar atelectasis. Small effusions cannot be excluded. No pneumothorax is identified. No focal consolidation is identified.     1. Mild basilar atelectasis with probable small bilateral pleural effusions. Olen Pel, MD 12/17/2015 3:43 AM     Xr Imported Outside Images    Result Date: 01/06/2016  The attached image(s) (individually and collectively, "Image") is/are from prior  encounter(s) occurring outside the current encounter at [facility].  The Image was obtained from the patient or patient's treating physician (in some cases, at the request of the patient's referring physician), and is incorporated into the [facility] medical record system for reference for the patient's current encounter and future encounters at [facility].  No current interpretation is provided for the Image.      US Venous Duplex  Doppler Arm Bilateral Complete    Result Date: 01/01/2016  EXAMINATION: Upper extremity venous duplex. INDICATIONS: Upper extremity pain and swelling with question of venous thrombotic disease. Dialysis patient with stent in the right subclavian vein and a dialysis catheter in the left brachiocephalic vein. TECHNIQUE: Duplex evaluation of both upper extremities is performed with grayscale, color-flow and gated Doppler technique assessing the central venous structures. Additional peripheral imaging is performed bilaterally. INTERPRETATION: The right internal jugular vein is largely thrombosed with minimal flow channel identified through the internal jugular vein. The right subclavian vein has a poor waveform with partial thrombosis of the right subclavian vein. Right brachiocephalic vein centrally is patent. Left brachycephalic vein is patent with arterialized flow in keeping with a patent dialysis fistula. Right brachial, basilic and cephalic veins are patent. Left axillary vein  is patent as is the brachial and basilic vein. There is a cephalic vein fistula identified in the left upper arm.      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 No findings to suggest acute DVT. Dimitrios  Papadouris, MD 01/01/2016 6:05 PM     US Venous Duplex Doppler Leg Bilateral    Result Date: 01/07/2016  History:  Leg swelling Deep veins from the bilateral common femoral to popliteal trifurcation demonstrate normal compressibility, color and Doppler flow, and augmentation.  The origin of the profunda femoral vein and visualized calf veins also appear patent.      No evidence for DVT. Charlott Rakes, MD 01/07/2016 2:43 PM     US Abdomen Limited Single Organ    Result Date: 01/05/2016  History: Abdominal distention FINDINGS: Sonographic evaluation the abdomen demonstrates a small to moderate moderate amount of abdominal ascites and pelvic ascites most pronounced  in the right upper quadrant and left lower quadrant.      Small to moderate volume of abdominal and pelvic ascites. Findings appear similar to that seen on 11/10/2015 CT, accounting for differences in technique Laurena Slimmer, MD 01/05/2016 8:30 AM     Echocardiogram [facility] [employer] [facility] [employer]    Result Date: 12/18/2015  [facility] , Transthoracic Echocardiogram 2D, M-mode, and Doppler Study date:  18-Dec-2015 Patient: JHAIR WITHERINGTON MR #: [id number] Account #: [id number] DOB: 10-01-1970 Age: 45 years Gender: Male Height: 73 in Weight: 235 lb BSA: 2.31 m BP: 170/ 101 Allergies: KETOROLAC TROMETHAMINE, MORPHINE, LISINOPRIL Sonographer:  Opal Sidles, RDCS, RVT Cardiologist:  Kelli Hope, MD CLINICAL QUESTION: CP, Limited for WMA HISTORY: PRIOR HISTORY: malignant HTN, ESRD, former smoker PROCEDURE: The procedure was performed at the bedside. This was a routine study. The transthoracic approach was used. The study included limited 2D imaging, M-mode, and limited spectral Doppler. Image quality was adequate. SYSTEM MEASUREMENT TABLES 2D mode EF Teichholz (2D mode): 37.5 % FS (2D-Teich): 18.3 % IVSd (2D): 10.1 mm IVSd (2D): 10.1 mm LVEF Biplane (2D): 38.3 % LVIDd (2D): 52.6 mm LVIDs (2D): 43 mm LVPWd (2D): 10.3 mm Apical four chamber LV Area Systolic (A4C): 2850 mm LV ESV (A4C): 74200 mm3 LV Length Systolic (A4C): 91.7 mm LEFT VENTRICLE: Size was at the upper limits of normal. A false tendon was identified in the mid cavity. Systolic function was moderately reduced. Ejection fraction was calculated to be 38 % by Simpson's biplane method, 35-40% by visual inspection. There was moderate diffuse hypokinesis. Wall thickness was at the upper limits of normal. Doppler: Left ventricular diastolic function was indeterminate due to E/A fusion on the LV inflow Spectral Doppler; there appears to be at least grade II (moderate) LV diastolic dysfunction. AORTIC VALVE: The valve was trileaflet. Leaflets exhibited normal thickness and  normal cuspal separation. AORTA: The root exhibited normal size. MITRAL VALVE: There was mild annular calcification. Valve structure was normal. There was normal leaflet separation. LEFT ATRIUM: The atrium was dilated. RIGHT VENTRICLE: The ventricle was moderately dilated. Basal diameter = 5.2 cm. Systolic function was reduced. TRICUSPID VALVE: The valve structure was normal. The annulus was dilated. There was normal leaflet separation. RIGHT ATRIUM: The atrium was dilated. PERICARDIUM: There was no pericardial effusion. SUMMARY: -  Procedure information: -  The study included limited 2D imaging, M-mode, and limited spectral Doppler. -  Left ventricle: -  Size was at the upper limits of  normal. A false tendon was identified in the mid cavity. -  Systolic function was moderately reduced. Ejection fraction was calculated to be 38 % by Simpson's biplane method, 35-40% by visual inspection. -  There was moderate diffuse hypokinesis. -  Wall thickness was at the upper limits of normal. -  Left ventricular diastolic function was indeterminate due to E/A fusion on the LV inflow Spectral Doppler; there appears to be at least grade II (moderate) LV diastolic dysfunction. -  Right ventricle: -  The ventricle was moderately dilated. Basal diameter = 5.2 cm. -  Systolic function was reduced. COMPARISONS: Compared to the report of the previous study from 11/04/2013, this is a limited study; four-chamber dilatation is now present; LV ejection fraction was estimated to be lower in this study; and reduced RV systolic pressure was also appreciated. Prepared and signed by Kelli Hope, MD Signed 18-Dec-2015 [facility]        Hospital Course:   [name] is a 45 y.o. male who presents to the hospital with sudden onset of chest pain. Chest pain started last night but went away whiles he was watching TV. 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 Patient was very anxious stating his chest felt similar  like when he had MI in 2010. Patient evaluated to be short of breath, fatigue and lethargic. Initial 12 lead EKG negative for Acute myocardial abnormalities.  Troponin 0.08.  Significant bibasilar crackles in both lung fields with highly elevated BNP level of thousand 2582.  Last echocardiogram August 2017 significant for depressed ejection fraction of 30 percent.  Patient admitted, possible acute on chronic congestive heart failure decompensation systolic dysfunction and chest pain. Serial cardiac enzymes were negative, patient was placed on IV diuresis with significant improvement of pulmonary status. Clinical course was uneventful patient was and cleared by cardiology and was discharged in stable condition to follow with PCP and cardiology as scheduled. Patient advised to report weight more to 2 kg in a week. CHF education pamphlets were given.       Discharge Medications:        Medication List      ASK your doctor about these medications    albuterol (2.5 MG/3ML) 0.083% nebulizer solution  Commonly known as:  PROVENTIL     albuterol-ipratropium 20-100 MCG/ACT Aers  Commonly known as:  COMBIVENT RESPIMAT     amLODIPine 10 MG tablet  Commonly known as:  NORVASC     carvedilol 25 MG tablet  Commonly known as:  COREG     furosemide 80 MG tablet  Commonly known as:  LASIX     hydrALAZINE 100 MG tablet  Commonly known as:  APRESOLINE     pantoprazole 40 MG tablet  Commonly known as:  PROTONIX     sevelamer 800 MG tablet  Commonly known as:  RENVELA              Discharge Instructions:       Disposition:  Home        Patient was instructed to follow up with Primary Care Doctor Pcp, Noneorunknown, MD in 1 month and with cardiology in 1 month      Minutes spent coordinating discharge and reviewing discharge plan:35 minutes          Signed by: Mosie Epstein, MD        CC: Christa See, MD

## 2016-01-13 NOTE — Telephone Encounter (Signed)
Transitional Care Management    CM attempted to contact pt. At [phone] (home)  in regards to recent hospitalization and TCM enrollment. CM left message asking that patient return call to CM at [phone].     Houston Siren, MSW  Case Manager  [facility]  Transitional Care Management  T [phone] F [phone]

## 2016-01-15 ENCOUNTER — Telehealth (INDEPENDENT_AMBULATORY_CARE_PROVIDER_SITE_OTHER): Payer: Self-pay

## 2016-01-15 ENCOUNTER — Emergency Department: Admit: 2016-01-15 | Payer: MEDICARE

## 2016-01-15 ENCOUNTER — Inpatient Hospital Stay
Admit: 2016-01-15 | Discharge: 2016-01-15 | Disposition: A | Discharge status: Home / Self Care | Payer: MEDICARE | Attending: Emergency Medicine

## 2016-01-15 DIAGNOSIS — R197 Diarrhea, unspecified: Secondary | ICD-10-CM

## 2016-01-15 LAB — CBC WITH AUTOMATED DIFF
ABS. BASOPHILS: 0 10*3/uL (ref 0.0–0.1)
ABS. EOSINOPHILS: 0.5 10*3/uL — ABNORMAL HIGH (ref 0.0–0.4)
ABS. LYMPHOCYTES: 0.5 10*3/uL — ABNORMAL LOW (ref 0.8–3.5)
ABS. MONOCYTES: 0.4 10*3/uL (ref 0.0–1.0)
ABS. NEUTROPHILS: 4.2 10*3/uL (ref 1.8–8.0)
BASOPHILS: 0 % (ref 0–1)
EOSINOPHILS: 9 % — ABNORMAL HIGH (ref 0–7)
HCT: 26.7 % — ABNORMAL LOW (ref 36.6–50.3)
HGB: 8.3 g/dL — ABNORMAL LOW (ref 12.1–17.0)
LYMPHOCYTES: 9 % — ABNORMAL LOW (ref 12–49)
MCH: 29.4 PG (ref 26.0–34.0)
MCHC: 31.1 g/dL (ref 30.0–36.5)
MCV: 94.7 FL (ref 80.0–99.0)
MONOCYTES: 8 % (ref 5–13)
NEUTROPHILS: 74 % (ref 32–75)
PLATELET: 197 10*3/uL (ref 150–400)
RBC: 2.82 M/uL — ABNORMAL LOW (ref 4.10–5.70)
RDW: 17.2 % — ABNORMAL HIGH (ref 11.5–14.5)
WBC: 5.6 10*3/uL (ref 4.1–11.1)

## 2016-01-15 LAB — LIPASE: Lipase: 314 U/L (ref 73–393)

## 2016-01-15 LAB — METABOLIC PANEL, COMPREHENSIVE
A-G Ratio: 0.6 — ABNORMAL LOW (ref 1.1–2.2)
ALT (SGPT): 22 U/L (ref 12–78)
AST (SGOT): 23 U/L (ref 15–37)
Albumin: 2.5 g/dL — ABNORMAL LOW (ref 3.5–5.0)
Alk. phosphatase: 264 U/L — ABNORMAL HIGH (ref 45–117)
Anion gap: 9 mmol/L (ref 5–15)
BUN/Creatinine ratio: 4 — ABNORMAL LOW (ref 12–20)
BUN: 21 MG/DL — ABNORMAL HIGH (ref 6–20)
Bilirubin, total: 0.4 MG/DL (ref 0.2–1.0)
CO2: 26 mmol/L (ref 21–32)
Calcium: 7.3 MG/DL — ABNORMAL LOW (ref 8.5–10.1)
Chloride: 103 mmol/L (ref 97–108)
Creatinine: 4.99 MG/DL — ABNORMAL HIGH (ref 0.70–1.30)
GFR est AA: 15 mL/min/{1.73_m2} — ABNORMAL LOW (ref >60)
GFR est non-AA: 13 mL/min/{1.73_m2} — ABNORMAL LOW (ref >60)
Globulin: 4 g/dL (ref 2.0–4.0)
Glucose: 88 mg/dL (ref 65–100)
Potassium: 4.5 mmol/L (ref 3.5–5.1)
Protein, total: 6.5 g/dL (ref 6.4–8.2)
Sodium: 138 mmol/L (ref 136–145)

## 2016-01-15 LAB — C. DIFFICILE (DNA): C. difficile (DNA): NEGATIVE

## 2016-01-15 MED ORDER — IOPAMIDOL 76 % IV SOLN
370 mg iodine /mL (76 %) | Freq: Once | INTRAVENOUS | Status: AC
Start: 2016-01-15 — End: 2016-01-15
  Administered 2016-01-15: 10:00:00 via INTRAVENOUS

## 2016-01-15 MED ORDER — HYDROMORPHONE (PF) 1 MG/ML IJ SOLN
1 mg/mL | Freq: Once | INTRAMUSCULAR | Status: AC
Start: 2016-01-15 — End: 2016-01-15
  Administered 2016-01-15: 12:00:00 via INTRAVENOUS

## 2016-01-15 MED ORDER — CARVEDILOL 12.5 MG TAB
12.5 mg | ORAL | Status: AC
Start: 2016-01-15 — End: 2016-01-15
  Administered 2016-01-15: 11:00:00 via ORAL

## 2016-01-15 MED ORDER — HYDRALAZINE 50 MG TAB
50 mg | ORAL | Status: AC
Start: 2016-01-15 — End: 2016-01-15
  Administered 2016-01-15: 11:00:00 via ORAL

## 2016-01-15 MED ORDER — SODIUM CHLORIDE 0.9 % IJ SYRG
Freq: Once | INTRAMUSCULAR | Status: AC
Start: 2016-01-15 — End: 2016-01-15
  Administered 2016-01-15: 10:00:00 via INTRAVENOUS

## 2016-01-15 MED ORDER — ONDANSETRON (PF) 4 MG/2 ML INJECTION
4 mg/2 mL | INTRAMUSCULAR | Status: AC
Start: 2016-01-15 — End: 2016-01-15
  Administered 2016-01-15: 09:00:00 via INTRAVENOUS

## 2016-01-15 MED ORDER — SODIUM CHLORIDE 0.9% BOLUS IV
0.9 % | Freq: Once | INTRAVENOUS | Status: AC
Start: 2016-01-15 — End: 2016-01-15
  Administered 2016-01-15: 10:00:00 via INTRAVENOUS

## 2016-01-15 MED ORDER — METRONIDAZOLE 500 MG TAB
500 mg | ORAL_TABLET | Freq: Three times a day (TID) | ORAL | 0 refills | Status: AC
Start: 2016-01-15 — End: 2016-01-25

## 2016-01-15 MED ORDER — SODIUM CHLORIDE 0.9% BOLUS IV
0.9 % | Freq: Once | INTRAVENOUS | Status: AC
Start: 2016-01-15 — End: 2016-01-15
  Administered 2016-01-15: 09:00:00 via INTRAVENOUS

## 2016-01-15 MED ORDER — AMLODIPINE 5 MG TAB
5 mg | ORAL | Status: AC
Start: 2016-01-15 — End: 2016-01-15
  Administered 2016-01-15: 11:00:00 via ORAL

## 2016-01-15 MED ORDER — ONDANSETRON 8 MG TAB, RAPID DISSOLVE
8 mg | ORAL_TABLET | Freq: Three times a day (TID) | ORAL | 0 refills | Status: DC | PRN
Start: 2016-01-15 — End: 2016-05-15

## 2016-01-15 MED ORDER — HYDROMORPHONE (PF) 1 MG/ML IJ SOLN
1 mg/mL | Freq: Once | INTRAMUSCULAR | Status: AC
Start: 2016-01-15 — End: 2016-01-15
  Administered 2016-01-15: 09:00:00 via INTRAVENOUS

## 2016-01-15 MED ORDER — METRONIDAZOLE 250 MG TAB
250 mg | ORAL | Status: AC
Start: 2016-01-15 — End: 2016-01-15
  Administered 2016-01-15: 11:00:00 via ORAL

## 2016-01-15 MED FILL — ONDANSETRON (PF) 4 MG/2 ML INJECTION: 4 mg/2 mL | INTRAMUSCULAR | Qty: 4

## 2016-01-15 MED FILL — SODIUM CHLORIDE 0.9 % IV: INTRAVENOUS | Qty: 100

## 2016-01-15 MED FILL — HYDRALAZINE 50 MG TAB: 50 mg | ORAL | Qty: 2

## 2016-01-15 MED FILL — HYDROMORPHONE (PF) 1 MG/ML IJ SOLN: 1 mg/mL | INTRAMUSCULAR | Qty: 1

## 2016-01-15 MED FILL — NORMAL SALINE FLUSH 0.9 % INJECTION SYRINGE: INTRAMUSCULAR | Qty: 10

## 2016-01-15 MED FILL — METRONIDAZOLE 250 MG TAB: 250 mg | ORAL | Qty: 2

## 2016-01-15 MED FILL — CARVEDILOL 12.5 MG TAB: 12.5 mg | ORAL | Qty: 1

## 2016-01-15 MED FILL — ISOVUE-370  76 % INTRAVENOUS SOLUTION: 370 mg iodine /mL (76 %) | INTRAVENOUS | Qty: 100

## 2016-01-15 MED FILL — AMLODIPINE 5 MG TAB: 5 mg | ORAL | Qty: 2

## 2016-01-15 MED FILL — SODIUM CHLORIDE 0.9 % IV: INTRAVENOUS | Qty: 250

## 2016-01-15 NOTE — ED Notes (Signed)
Pt given discharge instructions, patient education, prescriptions, and follow up information. Pt states understanding. All questions answered. Pt discharged to home in private vehicle with ride home for safety, ambulatory. Pt A/Ox4, RA, pain improved with medication.

## 2016-01-15 NOTE — ED Notes (Signed)
Assumed care for patient. He states he has abdominal pain across the entire bottom of his abdomen, nausea, vomiting and diarrhea since Sunday. States he recently had c. Diff and it feels the same. He also has a generalized rash, looks similar to hives on arms and legs. States this also started Sunday, and that it itches. IV started and labs sent on holds. Call bell within reach of patient.

## 2016-01-15 NOTE — ED Notes (Signed)
Bedside commode taken into room with backwards hat. Patient refused to allow staff to stay in room to assist him to BR. Given call bell and told to wait for staff before getting off commode and back to bed.

## 2016-01-15 NOTE — ED Notes (Signed)
Patient reassessed. He is asleep on the stretcher. BP is elevated. MD notified of BP, she is going to look at his typical morning medications and place some orders.

## 2016-01-15 NOTE — ED Notes (Signed)
Patient returned from CT

## 2016-01-15 NOTE — ED Notes (Signed)
Patient was able to provide stool sample. All three tubes collected and sent to lab. Patient then medicated with 0.5mg  Dilaudid for pain. Patient told that he needed to stay in ED for minimum of 30 minutes after his pain medication, but that otherwise was cleared for discharge. Told that [name], RN would be day time nurse and that she would provide him with discharge paperwork and remove IV prior to his leaving. Patient stated he understood. Call bell within reach.

## 2016-01-15 NOTE — ED Notes (Signed)
Patient reassessed at this time. He is very drowsy, in and out of consciousness at this time. MD at bedside to verify with patient that he has not taken any of his typical home morning medications, which patient states he has not taken. Patient then requested from MD another dose of pain medication. MD told patient that she is also ordering an antibiotic to cover to C. Diff, but she needs patient to provide stool sample prior to discharge. Patient states he can do that. All morning medications given at this time. Call bell within reach of patient.

## 2016-01-15 NOTE — ED Notes (Signed)
Patient medicated with Dilaudid for pain and Zofran for nausea at this time. Immediately after taken to CT.

## 2016-01-15 NOTE — ED Provider Notes (Signed)
Patient is a 45 y.o. male presenting with abdominal pain.   Abdominal Pain    Associated symptoms include diarrhea, nausea and vomiting. Pertinent negatives include no fever, no dysuria, no headaches and no chest pain.    45 year old male with ESRD (m/w/f) ascites, htn, presents with severe abdominal pain with nausea/vomiting and diarrhea since Sunday.  Was on antibiotics a few weeks ago for peritonitis.  Has had c diff in past and feels like this is same.  NO fevers. Pain is 10/10.  Decreased urination. Can't keep any fluids down. Feels week. Denies chest pain or SOB. Also complains of rash on arms and legs, spares trunk. Itchy.     Past Medical History:   Diagnosis Date   ??? Ascites    ??? Asthma    ??? C. difficile colitis    ??? CKD (chronic kidney disease) stage V requiring chronic dialysis (HCC)    ??? HTN (hypertension)    ??? SBP (spontaneous bacterial peritonitis) (HCC)        History reviewed. No pertinent surgical history.      History reviewed. No pertinent family history.    Social History     Social History   ??? Marital status: UNKNOWN     Spouse name: N/A   ??? Number of children: N/A   ??? Years of education: N/A     Occupational History   ??? Not on file.     Social History Main Topics   ??? Smoking status: Heavy Tobacco Smoker     Packs/day: 0.50   ??? Smokeless tobacco: Never Used   ??? Alcohol use No   ??? Drug use: No   ??? Sexual activity: Not on file     Other Topics Concern   ??? Not on file     Social History Narrative         ALLERGIES: Lisinopril; Morphine; and Toradol [ketorolac]    Review of Systems   Constitutional: Negative for fever.   HENT: Negative for congestion.    Eyes: Negative for visual disturbance.   Respiratory: Negative for cough and shortness of breath.    Cardiovascular: Negative for chest pain.   Gastrointestinal: Positive for abdominal pain, diarrhea, nausea and vomiting. Negative for blood in stool.   Endocrine: Negative for polyuria.    Genitourinary: Positive for decreased urine volume. Negative for dysuria.   Musculoskeletal: Negative for gait problem.   Skin: Positive for rash.   Neurological: Negative for headaches.   Psychiatric/Behavioral: Negative for dysphoric mood.       Vitals:    01/15/16 0442 01/15/16 0506   BP: (!) 166/93    Pulse: 76    Resp: 20    Temp: 98.8 ??F (37.1 ??C)    SpO2: 100% 95%   Weight: 106.6 kg (235 lb)    Height: 6\' 1"  (1.854 m)             Physical Exam   Constitutional: He is oriented to person, place, and time. He appears well-developed and well-nourished. No distress.   HENT:   Head: Normocephalic and atraumatic.   Mouth/Throat: Oropharynx is clear and moist. No oropharyngeal exudate.   Eyes: Conjunctivae and EOM are normal. Pupils are equal, round, and reactive to light. Right eye exhibits no discharge. Left eye exhibits no discharge. No scleral icterus.   Neck: Normal range of motion. Neck supple. No JVD present.   Cardiovascular: Normal rate, regular rhythm, normal heart sounds and intact distal pulses.  Exam reveals  no gallop and no friction rub.    No murmur heard.  Pulmonary/Chest: Effort normal and breath sounds normal. No stridor. No respiratory distress. He has no wheezes. He has no rales. He exhibits no tenderness.   dialysis port left anterior chest   Abdominal: Soft. Bowel sounds are normal. He exhibits distension (mild distension. no rebound or guarding). He exhibits no mass. There is no tenderness. There is no rebound and no guarding.   Musculoskeletal: Normal range of motion. He exhibits no edema or tenderness.   Neurological: He is alert and oriented to person, place, and time. He has normal reflexes. No cranial nerve deficit. He exhibits normal muscle tone. Coordination normal.   Skin: Skin is warm and dry. Rash (1-34mm raised pigmented lesions on arms and leg) noted. No erythema.   Psychiatric: He has a normal mood and affect. His behavior is normal. Judgment and thought content normal.        MDM   ED Course       Procedures    Work up reasssuring- ct negative, labs wnl for him.  Will get stool sample and start vanc. Follow up, return if worsening.

## 2016-01-15 NOTE — ED Triage Notes (Addendum)
Abdominal pain since Sunday. 4-5 episodes/day diarrhea with odor per patient . Patient thinks its cdiff. 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  Abdominal draintube infected patient was anbx x one week for that. Abdominal drain was taking out. Drain in place due to ascites. Patient c/o vomiting. Last emesis 0200.

## 2016-01-15 NOTE — Telephone Encounter (Signed)
Transitional Care Management    CM attempted to contact pt. At [phone] (home)  in regards to recent hospitalization and TCM enrollment. CM left message asking that patient return call to CM at [phone].     Houston Siren, MSW  Case Manager  [facility]  Transitional Care Management  T [phone] F [phone]

## 2016-01-18 LAB — CULTURE, STOOL
Campylobacter antigen: NEGATIVE
Shiga toxin-producing E. coli Ag: NEGATIVE

## 2016-01-21 ENCOUNTER — Telehealth: Payer: Self-pay

## 2016-01-21 NOTE — Telephone Encounter (Signed)
Transitional Care Management    CM attempted to contact pt. At [phone] (home)  in regards to recent hospitalization and TCM enrollment. CM left message asking that patient return call to CM at [phone]. CM will discharge patient as unable to contact.     Houston Siren, MSW  Case Manager  [facility]  Transitional Care Management  T [phone] F [phone]

## 2016-01-25 ENCOUNTER — Emergency Department: Payer: Medicare Other

## 2016-01-25 ENCOUNTER — Emergency Department
Admission: EM | Admit: 2016-01-25 | Discharge: 2016-01-26 | Disposition: A | Discharge status: Other Acute Inpatient Hospital | Payer: Medicare Other | Attending: Emergency Medicine | Admitting: Emergency Medicine

## 2016-01-25 ENCOUNTER — Emergency Department
Admission: EM | Admit: 2016-01-25 | Discharge: 2016-01-25 | Disposition: A | Discharge status: Home / Self Care | Payer: Medicare Other | Attending: Emergency Medicine | Admitting: Emergency Medicine

## 2016-01-25 DIAGNOSIS — R109 Unspecified abdominal pain: Secondary | ICD-10-CM

## 2016-01-25 DIAGNOSIS — Z79899 Other long term (current) drug therapy: Secondary | ICD-10-CM | POA: Insufficient documentation

## 2016-01-25 DIAGNOSIS — E875 Hyperkalemia: Secondary | ICD-10-CM | POA: Insufficient documentation

## 2016-01-25 DIAGNOSIS — R1084 Generalized abdominal pain: Secondary | ICD-10-CM | POA: Insufficient documentation

## 2016-01-25 DIAGNOSIS — I160 Hypertensive urgency: Secondary | ICD-10-CM | POA: Insufficient documentation

## 2016-01-25 DIAGNOSIS — Z886 Allergy status to analgesic agent status: Secondary | ICD-10-CM | POA: Insufficient documentation

## 2016-01-25 DIAGNOSIS — R197 Diarrhea, unspecified: Secondary | ICD-10-CM | POA: Insufficient documentation

## 2016-01-25 DIAGNOSIS — Z885 Allergy status to narcotic agent status: Secondary | ICD-10-CM | POA: Insufficient documentation

## 2016-01-25 DIAGNOSIS — R112 Nausea with vomiting, unspecified: Secondary | ICD-10-CM | POA: Insufficient documentation

## 2016-01-25 DIAGNOSIS — J45909 Unspecified asthma, uncomplicated: Secondary | ICD-10-CM | POA: Insufficient documentation

## 2016-01-25 DIAGNOSIS — I10 Essential (primary) hypertension: Secondary | ICD-10-CM | POA: Insufficient documentation

## 2016-01-25 DIAGNOSIS — Z87891 Personal history of nicotine dependence: Secondary | ICD-10-CM | POA: Insufficient documentation

## 2016-01-25 DIAGNOSIS — Z8619 Personal history of other infectious and parasitic diseases: Secondary | ICD-10-CM | POA: Insufficient documentation

## 2016-01-25 DIAGNOSIS — R1110 Vomiting, unspecified: Secondary | ICD-10-CM | POA: Insufficient documentation

## 2016-01-25 MED ORDER — ONDANSETRON HCL 4 MG/2ML IJ SOLN
4.0000 mg | Freq: Once | INTRAMUSCULAR | Status: AC
Start: 2016-01-25 — End: 2016-01-25
  Administered 2016-01-25: 4 mg via INTRAVENOUS
  Filled 2016-01-25: qty 2

## 2016-01-25 NOTE — ED Notes (Signed)
MD at bedside. 

## 2016-01-25 NOTE — ED Triage Notes (Signed)
See quick triage

## 2016-01-25 NOTE — ED Notes (Signed)
Pt requesting pain meds. MD spoke with pt about not giving pain meds at this time. Upon pts request again for pain meds the pt requested to leave and did not with to complete treatment.

## 2016-01-25 NOTE — Discharge Instructions (Signed)
Abdominal Pain    You have been diagnosed with abdominal (belly) pain. The cause of your pain is not yet known.    Many things can cause abdominal pain. Examples include viral infections and bowel (intestine) spasms. You might need another examination or more tests to find out why you have pain.    At this time, your pain does not seem to be caused by anything dangerous. You do not need surgery. You do not need to stay in the hospital.     Though we don't believe your condition is dangerous right now, it is important to be careful. Sometimes a problem that seems mild can become serious later. This is why it is very important that you return here or go to the nearest Emergency Department unless you are 100% improved.    YOU SHOULD SEEK MEDICAL ATTENTION IMMEDIATELY, EITHER HERE OR AT THE NEAREST EMERGENCY DEPARTMENT, IF ANY OF THE FOLLOWING OCCURS:   Your pain does not go away or gets worse.   You cannot keep fluids down or your vomit is dark green.    You vomit blood or see blood in your stool. Blood might be bright red or dark red. It can also be black and look like tar.   You have a fever (temperature higher than 100.4F / 38C) or shaking chills.   Your skin or eyes look yellow or your urine looks brown.   You have severe diarrhea.            Diarrhea    You have been diagnosed with diarrhea.    You have diarrhea when you have stools (bowel movements) that are soft or liquid, or when you have too many stools in a day. You may also have stomach cramps/ pains, nausea (feeling sick to your stomach), vomiting and fever (temperature higher than 100.4F / 38C).    Diarrhea can be caused by bacteria, viruses and parasites. People sometimes get "traveler s diarrhea." They get it when they go to other countries. It is caused by bacteria.    People with diarrhea often lose a lot of body fluids. This causes dehydration. Drink a lot of water or other fluids to stay hydrated. You may also be given medicine for  your diarrhea. It will slow the diarrhea and stop the vomiting (if you have this symptom).    You should drink lots of natural juices or a sports-type drink that has electrolytes (sodium, potassium, etc). Do not drink a lot of plain water. Sugary drinks like apple and pear juice might make the diarrhea worse.    You might be given medicine to help you with your diarrhea, nausea (feeling sick), vomiting and stomach cramps. This will depend on your age, medical history and symptoms.    It is OK for you to go home today.    Good hygiene (keeping clean) helps to keep the problem from spreading. Please wash your hands often, using soap and water, especially after using the bathroom. Do not prepare food or share food, drinks or utensils (forks, knives, etc.) with other people.    It is very important that you follow up with your regular doctor or a specialist. The doctor will tell you how soon this follow-up needs to be.    YOU SHOULD SEEK MEDICAL ATTENTION IMMEDIATELY, EITHER HERE OR AT THE NEAREST EMERGENCY DEPARTMENT, IF ANY OF THE FOLLOWING OCCURS:   You are vomiting and cannot keep down fluids.   There is blood, pus or mucous in   your stool.   You have symptoms of dehydration. These include dry mouth, not urinating (peeing) at least once every eight hours, feeling dizzy/lightheaded, severe weakness or passing out.

## 2016-01-25 NOTE — ED Notes (Signed)
Pt refusing Thigh BP cuff.

## 2016-01-25 NOTE — ED Provider Notes (Addendum)
EMERGENCY DEPT VISIT NOTE      Patient information was obtained from patient    History/Exam limitations: none.    History of Present Illness    Chief Complaint  Abdominal Pain; Emesis; and Diarrhea      [name] is a 45 y.o. male with PMHx significant for HTN, kidney failure on chronic HD, C. Diff w last treatment, and drug-seeking behavior c/o x6 days of abd pain a/w vomiting and diarrhea. Pt reports that his C. Diff has been managed at [facility] but told  that he needs to go somewhere else for a fecal transplant.   He reports that he goes no longer than 1 week without diarrhea. He does not produce urine. Of note, he had C. Diff cultures in June, July and August, as well as 10 days ago that were negative.    He reports diarrhea is acute on chronic/recurrent, he goes about 1 week without diarrhea and then it recurs, watery, nonbloody and nonpainful.  Abdominal pain is diffuse, similar to prior episodes, nonradiating, 10/10, no exacerbating or alleviating factors      Past Medical History:   Diagnosis Date   . Asthma without status asthmaticus    . 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 Clostridium difficile diarrhea    . Drug-seeking behavior    . Hypertension    . Kidney failure      Family History   Problem Relation Age of Onset   . Hypertension Mother      No current facility-administered medications for this encounter.      Current Outpatient Prescriptions   Medication Sig Dispense Refill   . acetaminophen (TYLENOL) 325 MG tablet Take 2 tablets (650 mg total) by mouth every 4 (four) hours as needed for Pain.     Marland Kitchen albuterol (PROVENTIL) (2.5 MG/3ML) 0.083% nebulizer solution Take 2.5 mg by nebulization 2 (two) times daily as needed.         Marland Kitchen albuterol-ipratropium (COMBIVENT RESPIMAT) 20-100 MCG/ACT Aero Soln Inhale 2 puffs into the lungs daily.         Marland Kitchen amLODIPine (NORVASC) 10 MG tablet Take 10 mg by mouth daily.         . calcium carbonate (TUMS) 500 MG chewable tablet Chew 2 tablets (1,000 mg  total) by mouth 2 (two) times daily.     . carvedilol (COREG) 25 MG tablet Take 25 mg by mouth 2 (two) times daily.     . darbepoetin alfa (ARANESP) 100 MCG/0.5ML injection Inject 0.5 mLs (100 mcg total) into the skin once a week.     . furosemide (LASIX) 80 MG tablet Take 80 mg by mouth daily.     . hydrALAZINE (APRESOLINE) 100 MG tablet Take 100 mg by mouth 3 (three) times daily.     . minoxidil (LONITEN) 2.5 MG tablet Take 2 tablets (5 mg total) by mouth every 12 (twelve) hours. 120 tablet 0   . pantoprazole (PROTONIX) 40 MG tablet Take 40 mg by mouth daily.     . sevelamer (RENVELA) 800 MG tablet Take 800 mg by mouth 3 (three) times daily with meals.     . traMADol (ULTRAM) 50 MG tablet Take 1 tablet (50 mg total) by mouth every 6 (six) hours as needed for Pain. 20 tablet 0     Allergies   Allergen Reactions   . Lisinopril Anaphylaxis   . Morphine Anaphylaxis   . Toradol [Ketorolac Tromethamine] Anaphylaxis     Social  History     Social History   . Marital status: Single     Spouse name: N/A   . Number of children: N/A   . Years of education: N/A     Occupational History   . Not on file.     Social History Main Topics   . Smoking status: Former Smoker     Packs/day: 0.50     Types: Cigarettes     Quit date: 09/18/2015   . Smokeless tobacco: Former Neurosurgeon   . Alcohol use No   . Drug use: No   . Sexual activity: Not on file     Other Topics Concern   . Not on file     Social History Narrative   . No narrative on file       Review of Systems    Positive and negative systems as per HPI.  All other systems reviewed and are negative.      Physical Exam  BP (!) 177/100   Pulse (!) 109   Temp 98.9 F (37.2 C) (Temporal Artery)   Resp 18   Ht 6\' 1"  (1.854 m)   Wt 105.7 kg   SpO2 99%   BMI 30.74 kg/m     Nursing note and vitals reviewed  Constitutional: Appears well-developed and well-nourished. No acute distress. Sitting up  comfortably, interactive, speaks clearly & completely  Head: Normocephalic and  atraumatic  Eyes: Conjunctiva and EOM are normal. PERRL, 3mm bilat  ENT: Nose clear. No redness. MM moist, no tonsillar exudate or asymmetry  Cardiovascular: Normal rate, regular rhythm. 2+ rad and DP pulses bilat  Pulmonary: Effort normal  Abdominal: Soft, No distention. There is Mild diffuse tenderness, distractible, no rebound, no guarding.  No fluid wave, extensive scar tissue, well healed  Musculoskeletal: Normal range of motion. No edema or deformity, no focal tenderness.  Neurological: Alert and oriented to person, place, and time. Grossly normal sensation  and strength  Skin: Skin is warm and dry. No rash noted  Psychiatric: Normal speech and behavior.    ED treatment:    Medications   ondansetron (ZOFRAN) injection 4 mg (4 mg Intravenous Given 01/25/16 0453)         Medical records reviewed    Labs reviewed    Labs Reviewed   STOOL FOR SALMONELLA,SHIGELLA,CAMPYLOBACTER AND SHIGA TOXIN PCR   CBC AND DIFFERENTIAL   BASIC METABOLIC PANEL   GFR       O2  saturation: 99 %; Oxygen use: room air; Interpretation: Normal        ED Course & MDM:     Patient with presentation concerning for recurrent colitis/enteritis, abdomen is soft and benign and I have low suspicion for acute surgical abdominal process  Given he has had 4 negative C. difficile cultures documented in the last 4 months, including one in the last 10 days, I see no indication to repeat this acutely  No objective evidence suggestive of clinically significant dehydration, tolerating oral fluids here.  After Zofran, I discussed the importance of outpatient PCP and gastrointestinal follow-up regardless    500: Patient requested to leave AGAINST MEDICAL ADVICE when he was not given narcotics    Pt was competent to make medical decisions and patient was thoroughly explained re: alternative treatments and risks, including worsening condition and death. They express full understanding. We did state that should the patient change their mind and want  reevaluation, they are welcome to return here at any time.  Diagnosis:   1. Generalized abdominal pain    2. Diarrhea, unspecified type      Disposition:   ED Disposition     ED Disposition Condition Date/Time Comment    [facility]  Sun Jan 25, 2016  4:58 AM Date: 01/25/2016  Patient: [name]  Admitted: 01/25/2016  3:55 AM  Attending Provider: Forestine Chute or his authorized caregiver has made the decision for the patient to leave the emergency department against the advice o f his attending physician. He or his authorized caregiver has been informed and understands the inherent risks, including death, sepsis, worsening pain.  He or his authorized caregiver has decided to accept the responsibility for this decision. [employer] rgan and all necessary parties have been advised that he may return for further evaluation or treatment. His condition at time of discharge was stable.  [name] had current vital signs as follows:  BP (!) 177/100   Pulse (!) 109   Temp 98.9 F (3 7.2 C) (Temporal Artery)   Resp 18   Ht 6\' 1"  (1.854 m)   Wt 105.7 kg           Condition: Stable      Khaalid Lefkowitz, MD  5:06 AM        *This note was generated by the Epic EMR system/ Dragon speech recognition and may contain inherent errors or omissions not intended by the user. Grammatical errors, random word insertions, deletions, pronoun errors and incomplete sentences are occasional consequences of this technology due to software limitations. Not all errors are caught or corrected. If there are questions or concerns about the content of this note or information contained within the body of this dictation they should be addressed directly with the author for clarification.*    Attestations  I was acting as a Neurosurgeon for [employer], Ethan Kasperski E* on [employer] D  Treatment Team: Scribe: Araceli Bouche    I am the first provider for this patient and I personally performed the services  documented. Treatment Team: Scribe: Araceli Bouche is scribing for me on [facility] D. This note accurately reflects work and decisions made by me.  Rushi Chasen E*     Azhane Eckart Christiane Ha, MD  01/25/16 0507       Lucky Rathke, MD  01/25/16 [phone]

## 2016-01-25 NOTE — ED Notes (Signed)
Pt has a dialysis port that is placed in his left upper chest that was present before pt came to ED

## 2016-01-25 NOTE — ED Notes (Addendum)
Pt presents as very drowsy and drifts off to sleep while communicating with the nurse. Pt states this is due to not being able to sleep because of the diarrhea.

## 2016-01-26 ENCOUNTER — Emergency Department: Payer: Medicare Other

## 2016-01-26 ENCOUNTER — Inpatient Hospital Stay
Admission: AD | Admit: 2016-01-26 | Payer: Self-pay | Source: Other Acute Inpatient Hospital | Admitting: Internal Medicine

## 2016-01-26 ENCOUNTER — Encounter: Payer: Self-pay | Admitting: Nephrology

## 2016-01-26 ENCOUNTER — Inpatient Hospital Stay
Admission: AD | Admit: 2016-01-26 | Discharge: 2016-02-06 | DRG: 683 | Disposition: A | Discharge status: Home / Self Care | Payer: Medicare Other | Source: Other Acute Inpatient Hospital

## 2016-01-26 ENCOUNTER — Inpatient Hospital Stay: Payer: Medicare Other | Admitting: Internal Medicine

## 2016-01-26 DIAGNOSIS — A498 Other bacterial infections of unspecified site: Secondary | ICD-10-CM | POA: Diagnosis present

## 2016-01-26 DIAGNOSIS — Z765 Malingerer [conscious simulation]: Secondary | ICD-10-CM

## 2016-01-26 DIAGNOSIS — D631 Anemia in chronic kidney disease: Secondary | ICD-10-CM | POA: Diagnosis present

## 2016-01-26 DIAGNOSIS — N2581 Secondary hyperparathyroidism of renal origin: Secondary | ICD-10-CM | POA: Diagnosis present

## 2016-01-26 DIAGNOSIS — I5042 Chronic combined systolic (congestive) and diastolic (congestive) heart failure: Secondary | ICD-10-CM | POA: Diagnosis present

## 2016-01-26 DIAGNOSIS — F419 Anxiety disorder, unspecified: Secondary | ICD-10-CM | POA: Diagnosis present

## 2016-01-26 DIAGNOSIS — Z9114 Patient's other noncompliance with medication regimen: Secondary | ICD-10-CM

## 2016-01-26 DIAGNOSIS — N186 End stage renal disease: Secondary | ICD-10-CM | POA: Diagnosis present

## 2016-01-26 DIAGNOSIS — I10 Essential (primary) hypertension: Secondary | ICD-10-CM | POA: Diagnosis present

## 2016-01-26 DIAGNOSIS — I129 Hypertensive chronic kidney disease with stage 1 through stage 4 chronic kidney disease, or unspecified chronic kidney disease: Principal | ICD-10-CM | POA: Diagnosis present

## 2016-01-26 DIAGNOSIS — I429 Cardiomyopathy, unspecified: Secondary | ICD-10-CM | POA: Diagnosis present

## 2016-01-26 DIAGNOSIS — J4520 Mild intermittent asthma, uncomplicated: Secondary | ICD-10-CM | POA: Diagnosis present

## 2016-01-26 DIAGNOSIS — A0471 Enterocolitis due to Clostridium difficile, recurrent: Secondary | ICD-10-CM | POA: Diagnosis present

## 2016-01-26 DIAGNOSIS — R188 Other ascites: Secondary | ICD-10-CM | POA: Diagnosis present

## 2016-01-26 DIAGNOSIS — Z8249 Family history of ischemic heart disease and other diseases of the circulatory system: Secondary | ICD-10-CM

## 2016-01-26 DIAGNOSIS — F17210 Nicotine dependence, cigarettes, uncomplicated: Secondary | ICD-10-CM | POA: Diagnosis present

## 2016-01-26 DIAGNOSIS — E872 Acidosis: Secondary | ICD-10-CM | POA: Diagnosis present

## 2016-01-26 DIAGNOSIS — Z87892 Personal history of anaphylaxis: Secondary | ICD-10-CM

## 2016-01-26 DIAGNOSIS — Z9119 Patient's noncompliance with other medical treatment and regimen: Secondary | ICD-10-CM

## 2016-01-26 DIAGNOSIS — F6081 Narcissistic personality disorder: Secondary | ICD-10-CM | POA: Diagnosis present

## 2016-01-26 DIAGNOSIS — Z885 Allergy status to narcotic agent status: Secondary | ICD-10-CM

## 2016-01-26 DIAGNOSIS — Z9115 Patient's noncompliance with renal dialysis: Secondary | ICD-10-CM

## 2016-01-26 DIAGNOSIS — Z992 Dependence on renal dialysis: Secondary | ICD-10-CM

## 2016-01-26 DIAGNOSIS — E875 Hyperkalemia: Secondary | ICD-10-CM | POA: Diagnosis present

## 2016-01-26 DIAGNOSIS — Z888 Allergy status to other drugs, medicaments and biological substances status: Secondary | ICD-10-CM

## 2016-01-26 DIAGNOSIS — F54 Psychological and behavioral factors associated with disorders or diseases classified elsewhere: Secondary | ICD-10-CM

## 2016-01-26 LAB — CBC AND DIFFERENTIAL
Basophils %: 1 % (ref 0.0–3.0)
Basophils Absolute: 0.1 10*3/uL (ref 0.0–0.3)
Eosinophils %: 2.8 % (ref 0.0–7.0)
Eosinophils Absolute: 0.2 10*3/uL (ref 0.0–0.8)
Hematocrit: 30 % — ABNORMAL LOW (ref 39.0–52.5)
Hemoglobin: 9.9 gm/dL — ABNORMAL LOW (ref 13.0–17.5)
Lymphocytes Absolute: 0.5 10*3/uL — ABNORMAL LOW (ref 0.6–5.1)
Lymphocytes: 8.9 % — ABNORMAL LOW (ref 15.0–46.0)
MCH: 31 pg (ref 28–35)
MCHC: 33 gm/dL (ref 31–36)
MCV: 94 fL (ref 80–100)
MPV: 5.6 fL — ABNORMAL LOW (ref 6.0–10.0)
Monocytes Absolute: 0.5 10*3/uL (ref 0.1–1.7)
Monocytes: 7.5 % (ref 3.0–15.0)
Neutrophils %: 79.8 % — ABNORMAL HIGH (ref 42.0–78.0)
Neutrophils Absolute: 4.9 10*3/uL (ref 1.7–8.6)
PLT CT: 145 10*3/uL (ref 130–440)
RBC: 3.2 10*6/uL — ABNORMAL LOW (ref 4.00–5.70)
RDW: 16.3 % — ABNORMAL HIGH (ref 10.5–14.5)
WBC: 6.1 10*3/uL (ref 4.00–11.00)

## 2016-01-26 LAB — COMPREHENSIVE METABOLIC PANEL
ALT: 8 U/L (ref 0–55)
AST (SGOT): 15 U/L (ref 10–42)
Albumin/Globulin Ratio: 0.72 Ratio (ref 0.70–1.50)
Albumin: 2.6 gm/dL — ABNORMAL LOW (ref 3.5–5.0)
Alkaline Phosphatase: 218 U/L — ABNORMAL HIGH (ref 40–145)
Anion Gap: 17.5 mMol/L (ref 7.0–18.0)
BUN / Creatinine Ratio: 5.1 Ratio — ABNORMAL LOW (ref 10.0–30.0)
BUN: 58 mg/dL — ABNORMAL HIGH (ref 7–22)
Bilirubin, Total: 0.6 mg/dL (ref 0.1–1.2)
CO2: 21.4 mMol/L (ref 20.0–30.0)
Calcium: 7.9 mg/dL — ABNORMAL LOW (ref 8.5–10.5)
Chloride: 107 mMol/L (ref 98–110)
Creatinine: 11.37 mg/dL — ABNORMAL HIGH (ref 0.80–1.30)
EGFR: 6 mL/min/{1.73_m2} — ABNORMAL LOW (ref 60–150)
Globulin: 3.7 gm/dL (ref 2.0–4.0)
Glucose: 83 mg/dL (ref 70–99)
Osmolality Calc: 295 mOsm/kg (ref 275–300)
Potassium: 5.7 mMol/L — ABNORMAL HIGH (ref 3.5–5.3)
Protein, Total: 6.3 gm/dL (ref 6.0–8.3)
Sodium: 140 mMol/L (ref 136–147)

## 2016-01-26 LAB — LIPASE: Lipase: 74 U/L (ref 8–78)

## 2016-01-26 LAB — VH C. DIFFICILE TOXIN B GENE BY DNA AMPLIFICATION: Stool Clostridium difficile Toxin B Gene DNA Amplification: POSITIVE — CR

## 2016-01-26 LAB — HEPATITIS B SURFACE ANTIGEN W/ REFLEX TO CONFIRMATION: Hepatitis B Surface Antigen: NONREACTIVE

## 2016-01-26 MED ORDER — ACETAMINOPHEN 325 MG PO TABS
650.0000 mg | ORAL_TABLET | ORAL | Status: DC | PRN
Start: 2016-01-26 — End: 2016-02-06

## 2016-01-26 MED ORDER — CLONIDINE HCL 0.1 MG PO TABS
0.1000 mg | ORAL_TABLET | Freq: Two times a day (BID) | ORAL | Status: DC
Start: 2016-01-26 — End: 2016-01-27
  Filled 2016-01-26 (×4): qty 1

## 2016-01-26 MED ORDER — NITROGLYCERIN 2 % TD OINT
1.0000 [in_us] | TOPICAL_OINTMENT | TRANSDERMAL | Status: DC
Start: 2016-01-26 — End: 2016-02-06
  Filled 2016-01-26 (×37): qty 1

## 2016-01-26 MED ORDER — HYDRALAZINE HCL 20 MG/ML IJ SOLN
5.0000 mg | Freq: Once | INTRAMUSCULAR | Status: AC
Start: 2016-01-26 — End: 2016-01-26
  Administered 2016-01-26: 5 mg via INTRAVENOUS

## 2016-01-26 MED ORDER — ALBUTEROL SULFATE (2.5 MG/3ML) 0.083% IN NEBU
2.5000 mg | INHALATION_SOLUTION | Freq: Two times a day (BID) | RESPIRATORY_TRACT | Status: DC | PRN
Start: 2016-01-26 — End: 2016-02-06

## 2016-01-26 MED ORDER — VH HYDROMORPHONE HCL PF 1 MG/ML CARPUJECT
0.5000 mg | Freq: Once | INTRAMUSCULAR | Status: AC
Start: 2016-01-26 — End: 2016-01-26
  Administered 2016-01-26: 0.5 mg via INTRAVENOUS

## 2016-01-26 MED ORDER — FUROSEMIDE 10 MG/ML IJ SOLN
80.0000 mg | Freq: Once | INTRAMUSCULAR | Status: AC
Start: 2016-01-26 — End: 2016-01-26
  Administered 2016-01-26: 80 mg via INTRAVENOUS

## 2016-01-26 MED ORDER — VH GI COCKTAIL WITH DICYCLOMINE (SIMPLE)
45.0000 mL | Freq: Once | ORAL | Status: AC
Start: 2016-01-26 — End: 2016-01-26
  Administered 2016-01-26: 45 mL via ORAL
  Filled 2016-01-26: qty 45

## 2016-01-26 MED ORDER — AMLODIPINE BESYLATE 5 MG PO TABS
10.0000 mg | ORAL_TABLET | Freq: Once | ORAL | Status: AC
Start: 2016-01-26 — End: 2016-01-26
  Administered 2016-01-26: 10 mg via ORAL

## 2016-01-26 MED ORDER — PANTOPRAZOLE SODIUM 40 MG PO TBEC
40.0000 mg | DELAYED_RELEASE_TABLET | Freq: Every morning | ORAL | Status: DC
Start: 2016-01-26 — End: 2016-01-26
  Administered 2016-01-26: 40 mg via ORAL
  Filled 2016-01-26: qty 1

## 2016-01-26 MED ORDER — AMLODIPINE BESYLATE 5 MG PO TABS
10.0000 mg | ORAL_TABLET | Freq: Every day | ORAL | Status: DC
Start: 2016-01-26 — End: 2016-02-06
  Administered 2016-01-27 – 2016-02-03 (×7): 10 mg via ORAL
  Filled 2016-01-26 (×12): qty 2

## 2016-01-26 MED ORDER — ACETAMINOPHEN 160 MG/5ML PO SOLN
650.0000 mg | ORAL | Status: DC | PRN
Start: 2016-01-26 — End: 2016-02-06

## 2016-01-26 MED ORDER — DICYCLOMINE HCL 20 MG PO TABS
20.0000 mg | ORAL_TABLET | Freq: Four times a day (QID) | ORAL | Status: DC
Start: 2016-01-26 — End: 2016-02-05
  Administered 2016-01-26 – 2016-02-04 (×37): 20 mg via ORAL
  Filled 2016-01-26 (×44): qty 1

## 2016-01-26 MED ORDER — HEPARIN SODIUM (PORCINE) 1000 UNIT/ML IJ SOLN
INTRAMUSCULAR | Status: AC
Start: 2016-01-26 — End: ?
  Filled 2016-01-26: qty 10

## 2016-01-26 MED ORDER — SODIUM CHLORIDE 0.9 % IJ SOLN
0.4000 mg | INTRAMUSCULAR | Status: DC | PRN
Start: 2016-01-26 — End: 2016-02-06

## 2016-01-26 MED ORDER — HEPARIN SODIUM (PORCINE) 1000 UNIT/ML IJ SOLN
1000.0000 [IU] | INTRAMUSCULAR | Status: DC | PRN
Start: 2016-01-26 — End: 2016-02-06
  Filled 2016-01-26: qty 3

## 2016-01-26 MED ORDER — AMLODIPINE BESYLATE 5 MG PO TABS
ORAL_TABLET | ORAL | Status: AC
Start: 2016-01-26 — End: ?
  Filled 2016-01-26: qty 2

## 2016-01-26 MED ORDER — SODIUM CHLORIDE 0.9 % IV BOLUS
250.0000 mL | INTRAVENOUS | Status: AC | PRN
Start: 2016-01-26 — End: 2016-01-26

## 2016-01-26 MED ORDER — FUROSEMIDE 10 MG/ML IJ SOLN
INTRAMUSCULAR | Status: AC
Start: 2016-01-26 — End: ?
  Filled 2016-01-26: qty 8

## 2016-01-26 MED ORDER — CARVEDILOL 25 MG PO TABS
25.0000 mg | ORAL_TABLET | Freq: Two times a day (BID) | ORAL | Status: DC
Start: 2016-01-26 — End: 2016-02-06
  Administered 2016-01-26 – 2016-02-06 (×18): 25 mg via ORAL
  Filled 2016-01-26 (×24): qty 1

## 2016-01-26 MED ORDER — HYDRALAZINE HCL 20 MG/ML IJ SOLN
10.0000 mg | Freq: Four times a day (QID) | INTRAMUSCULAR | Status: DC | PRN
Start: 2016-01-26 — End: 2016-02-06
  Administered 2016-01-26 – 2016-01-31 (×4): 10 mg via INTRAVENOUS
  Filled 2016-01-26 (×4): qty 1

## 2016-01-26 MED ORDER — HYDRALAZINE HCL 50 MG PO TABS
100.0000 mg | ORAL_TABLET | Freq: Three times a day (TID) | ORAL | Status: DC
Start: 2016-01-26 — End: 2016-02-06
  Administered 2016-01-26 – 2016-02-06 (×28): 100 mg via ORAL
  Filled 2016-01-26 (×37): qty 2

## 2016-01-26 MED ORDER — CALCITRIOL 0.25 MCG PO CAPS
0.5000 ug | ORAL_CAPSULE | ORAL | Status: DC
Start: 2016-01-26 — End: 2016-02-06
  Administered 2016-01-26 – 2016-02-06 (×5): 0.5 ug via ORAL
  Filled 2016-01-26 (×7): qty 2

## 2016-01-26 MED ORDER — HYDROMORPHONE HCL 2 MG/ML IJ SOLN
0.5000 mg | INTRAMUSCULAR | Status: DC | PRN
Start: 2016-01-26 — End: 2016-01-27
  Administered 2016-01-26 – 2016-01-27 (×8): 0.5 mg via INTRAVENOUS
  Filled 2016-01-26 (×8): qty 1

## 2016-01-26 MED ORDER — NITROGLYCERIN 2 % TD OINT
TOPICAL_OINTMENT | TRANSDERMAL | Status: AC
Start: 2016-01-26 — End: ?
  Filled 2016-01-26: qty 1

## 2016-01-26 MED ORDER — SODIUM CHLORIDE 0.9 % IV BOLUS
100.0000 mL | INTRAVENOUS | Status: AC | PRN
Start: 2016-01-26 — End: 2016-01-26

## 2016-01-26 MED ORDER — HYDROMORPHONE HCL 2 MG/ML IJ SOLN
0.5000 mg | Freq: Once | INTRAMUSCULAR | Status: AC
Start: 2016-01-26 — End: 2016-01-26
  Administered 2016-01-26: 0.5 mg via INTRAVENOUS
  Filled 2016-01-26: qty 1

## 2016-01-26 MED ORDER — FENTANYL CITRATE (PF) 50 MCG/ML IJ SOLN (WRAP)
INTRAMUSCULAR | Status: AC
Start: 2016-01-26 — End: ?
  Filled 2016-01-26: qty 2

## 2016-01-26 MED ORDER — HYDROMORPHONE HCL 2 MG/ML IJ SOLN
0.2000 mg | Freq: Once | INTRAMUSCULAR | Status: AC
Start: 2016-01-26 — End: 2016-01-26
  Administered 2016-01-26: 0.2 mg via INTRAVENOUS
  Filled 2016-01-26: qty 1

## 2016-01-26 MED ORDER — FENTANYL CITRATE (PF) 50 MCG/ML IJ SOLN (WRAP)
50.0000 ug | Freq: Once | INTRAMUSCULAR | Status: AC
Start: 2016-01-26 — End: 2016-01-26
  Administered 2016-01-26: 50 ug via INTRAVENOUS

## 2016-01-26 MED ORDER — ALBUTEROL-IPRATROPIUM 100-20 MCG/ACT IN AERS
2.0000 | INHALATION_SPRAY | Freq: Four times a day (QID) | RESPIRATORY_TRACT | Status: DC
Start: 2016-01-26 — End: 2016-02-06
  Administered 2016-01-26 – 2016-02-06 (×15): 2 via RESPIRATORY_TRACT
  Filled 2016-01-26 (×3): qty 4

## 2016-01-26 MED ORDER — HEPARIN SODIUM (PORCINE) PF 5000 UNIT/0.5ML IJ SOLN
5000.0000 [IU] | Freq: Two times a day (BID) | INTRAMUSCULAR | Status: DC
Start: 2016-01-26 — End: 2016-02-06
  Filled 2016-01-26 (×24): qty 0.5

## 2016-01-26 MED ORDER — FAMOTIDINE 20 MG PO TABS
20.0000 mg | ORAL_TABLET | Freq: Every day | ORAL | Status: DC
Start: 2016-01-26 — End: 2016-02-06
  Administered 2016-01-26 – 2016-02-06 (×11): 20 mg via ORAL
  Filled 2016-01-26 (×12): qty 1

## 2016-01-26 MED ORDER — ACETAMINOPHEN 650 MG RE SUPP
650.0000 mg | RECTAL | Status: DC | PRN
Start: 2016-01-26 — End: 2016-02-06

## 2016-01-26 MED ORDER — ONDANSETRON 4 MG PO TBDP
8.0000 mg | ORAL_TABLET | ORAL | Status: DC | PRN
Start: 2016-01-26 — End: 2016-02-06
  Administered 2016-01-26: 8 mg via ORAL
  Filled 2016-01-26: qty 2

## 2016-01-26 MED ORDER — HYDRALAZINE HCL 20 MG/ML IJ SOLN
INTRAMUSCULAR | Status: AC
Start: 2016-01-26 — End: ?
  Filled 2016-01-26: qty 1

## 2016-01-26 MED ORDER — FIDAXOMICIN 200 MG PO TABS
200.0000 mg | ORAL_TABLET | Freq: Two times a day (BID) | ORAL | Status: DC
Start: 2016-01-26 — End: 2016-01-27
  Administered 2016-01-26 – 2016-01-27 (×3): 200 mg via ORAL
  Filled 2016-01-26 (×4): qty 1

## 2016-01-26 MED ORDER — PROCHLORPERAZINE EDISYLATE 5 MG/ML IJ SOLN
10.0000 mg | INTRAMUSCULAR | Status: DC | PRN
Start: 2016-01-26 — End: 2016-02-06
  Administered 2016-02-03 – 2016-02-04 (×3): 10 mg via INTRAVENOUS
  Filled 2016-01-26 (×3): qty 2

## 2016-01-26 MED ORDER — OXYCODONE-ACETAMINOPHEN 5-325 MG PO TABS
1.0000 | ORAL_TABLET | Freq: Four times a day (QID) | ORAL | Status: DC | PRN
Start: 2016-01-26 — End: 2016-02-06
  Administered 2016-02-05: 1 via ORAL
  Filled 2016-01-26: qty 1

## 2016-01-26 MED ORDER — FUROSEMIDE 80 MG PO TABS
80.0000 mg | ORAL_TABLET | Freq: Every day | ORAL | Status: DC
Start: 2016-01-26 — End: 2016-02-06
  Administered 2016-01-26 – 2016-02-05 (×10): 80 mg via ORAL
  Filled 2016-01-26 (×12): qty 1

## 2016-01-26 MED ORDER — VH HYDROMORPHONE HCL 1 MG/ML (NARRATOR)
INTRAMUSCULAR | Status: AC
Start: 2016-01-26 — End: ?
  Filled 2016-01-26: qty 1

## 2016-01-26 MED ORDER — NITROGLYCERIN 2 % TD OINT
0.5000 [in_us] | TOPICAL_OINTMENT | Freq: Once | TRANSDERMAL | Status: AC
Start: 2016-01-26 — End: 2016-01-26
  Administered 2016-01-26: 0.5 [in_us] via TOPICAL

## 2016-01-26 MED ORDER — SEVELAMER CARBONATE 800 MG PO TABS
800.0000 mg | ORAL_TABLET | Freq: Three times a day (TID) | ORAL | Status: DC
Start: 2016-01-26 — End: 2016-02-06
  Administered 2016-01-26 – 2016-02-06 (×24): 800 mg via ORAL
  Filled 2016-01-26 (×36): qty 1

## 2016-01-26 MED ORDER — ONDANSETRON HCL 4 MG/2ML IJ SOLN
4.0000 mg | INTRAMUSCULAR | Status: DC | PRN
Start: 2016-01-26 — End: 2016-02-06
  Administered 2016-01-26 – 2016-02-05 (×14): 4 mg via INTRAVENOUS
  Filled 2016-01-26 (×15): qty 2

## 2016-01-26 NOTE — ED Notes (Signed)
I sent a copy of the face sheet, and the release of information paperwork to [facility] [phone] in the envelope with the pt.

## 2016-01-26 NOTE — Progress Notes (Addendum)
Pt was admitted this am   Pt was seen and examined   C/o abd pain   Sitting up on bed ; comfortable looking   D/w Nephrologist   D/w RN   D/w case mx    pt will f/u with [facility] for fecal transplant .  Case mx to evaluate if his insurance covers for DIFICID .  BP uncontrolled ; added clonidine   Cont current meds   [place] abd .

## 2016-01-26 NOTE — Plan of Care (Signed)
Problem: Renal Instability  Goal: Fluid and electrolyte balance are achieved/maintained  Outcome: Progressing   01/26/16 1544   Goal/Interventions addressed this shift   Fluid and electrolyte balance are achieved/maintained  Monitor intake and output every shift;Monitor/assess lab values and report abnormal values;Provide adequate hydration;Monitor daily weight;Assess for confusion/personality changes;Assess and reassess fluid and electrolyte status;Observe for seizure activity and initiate seizure precautions if indicated;Monitor for muscle weakness;Observe for cardiac arrhythmias;Follow fluid restrictions/IV/PO parameters       Comments: Labs being monitored, pt weighed daily, pain medication administered prn for abdominal pain 10/10.

## 2016-01-26 NOTE — Progress Notes (Signed)
Dialysis Treatment Note    Patient:  [name] MRN#:  [id number]  Unit/Room/Bed:  540/981-X   Isolation: Contact Special       Allergies: Allergies   Allergen Reactions   . Lisinopril Anaphylaxis   . Morphine Anaphylaxis   . Toradol [Ketorolac Tromethamine] Anaphylaxis     Code Status: Full Code    Problem List:   Patient Active Problem List    Diagnosis Date Noted   . ESRD needing dialysis 01/26/2016   . Clostridium difficile infection 01/26/2016   . Cardiomyopathy    . Cardiac LV ejection fraction 21-40% 01/07/2016   . Chest pain, unspecified type 01/06/2016   . Chest pain in adult 12/30/2015   . Acute on chronic congestive heart failure 12/30/2015   . Drug-seeking behavior 12/18/2015   . HAP (hospital-acquired pneumonia) 12/17/2015   . Diarrhea 12/17/2015   . Hospital-acquired pneumonia    . Nausea & vomiting 11/09/2015   . Decreased appetite 11/09/2015   . Ascites 11/09/2015   . Diarrhea of infectious origin 11/09/2015   . Acute pain due to trauma 11/17/2013   . Pulmonary contusion, initial encounter 11/17/2013   . 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 Hypertension 11/17/2013       Dialysis Order:  Active Orders   Dialysis    Hemodialysis inpatient     Frequency: Once     Start Date/Time: 01/26/16 1133     Number of Occurrences:  1 Occurrences     Order Questions:     . Date of Dialysis 01/26/2016     . K+ (initial) 2 mEq     . KPP (Per Protocol) Call MD     . Ca++ 2.5 mEq     . Bicarb 35 mEq     . Na+ 138 mEq     . Dialyzer ReviClear     . Dialysate Temperature (C) 37     . BFR-As tolerated to a maximum of: 400 mL/min     . DFR 600 mL/min     . Duration of Treatment 3.5 Hours     . Fluid Removal (L) and Dry Weight (Kg) Dry Weight (please specify) (Comment: 97 kg)     . Limit Ultrafiltration Rate Yes     . Specify mL/hr/kg 13     . Access Type-Location Tunneled-L        Dialysis Treatment Type:    Time out/Safety Check: Time Out/Safety Check Completed: Yes (01/26/16 0930)  [place]  Hemodialysis Consent: Consent for HD signed for this hospitalization: Yes (01/26/16 0930)  Blood Consent (if applicable): Blood Consent Verified: Not Applicable (01/26/16 0930)    Dialysis Access:  Permacath/Temporary Catheter Permacath Left (Active)   Line necessity reviewed? Dialysis 01/26/2016 10:31 AM   Catheter Lumen Volume Venous 1.9 mL 01/26/2016 10:31 AM   Catheter Lumen Volume Arterial 1.8 mL 01/26/2016 10:31 AM   Dressing Status and Intervention Clean & Dry 01/26/2016 10:31 AM   Dressing Type Antimicrobial impregnated 01/26/2016 10:31 AM   Dressing Status Clean;Dry;Intact 01/26/2016 10:31 AM           General Assessments:          01/26/16 0945   Vitals   Temp 98.3 F (36.8 C)   Heart Rate (!) 104   Resp Rate 18   BP (!) 182/109   Assessment   Respiratory  WDL   Respiratory Pattern Easy;Unlabored   General Skin Color Appropriate for ethnicity   Skin  Condition/Temp Warm;Dry   GI Symptoms None        01/26/16 1407   Vitals   Temp 98 F (36.7 C)   Heart Rate (!) 110   Resp Rate 18   BP (!) 200/115   Assessment   Respiratory Pattern Easy;Unlabored   General Skin Color Appropriate for ethnicity   Skin Condition/Temp Warm;Dry   GI Symptoms Nausea           Pain Assessment: Pain Assessment  Charting Type: Assessment (01/26/16 1445)  GWN Pain Level (Read Only): 10 (01/26/16 1509)  Pain Scale Used: Numeric Scale (0-10) (01/26/16 1445)    Labs Values:    HBsAg Result:HBsAg (Antigen) Result: Unknown (01/26/16 0930)  HBsAg Date Drawn:    HBsAg Next Due Date:     Lab Results   Component Value Date    BUN 58 (H) 01/26/2016    NA 140 01/26/2016    K 5.7 (H) 01/26/2016    CREAT 11.37 (H) 01/26/2016    CO2 21.4 01/26/2016    CA 7.9 (L) 01/26/2016    PHOS 5.8 (H) 01/06/2016    GLU 83 01/26/2016    ALB 2.6 (L) 01/26/2016    HGB 9.9 (L) 01/26/2016    HCT 30.0 (L) 01/26/2016    WBC 6.1 01/26/2016    PLT 145 01/26/2016    PTT 40 (H) 11/10/2015    PT 17.4 (H) 11/12/2015    INR 1.4 (H) 11/12/2015         Current Diet Order      Diet  renal 80 GM Protein     RO/Hemodialysis Machine Safety Checks - Before Each Treatment:  RO/Hemodialysis Dance movement psychotherapist Number: 28 (01/26/16 0930)  RO #: [id number] (01/26/16 0930)  Water Hardness: 0 (01/26/16 0930)  pH: 7.4 (01/26/16 0930)  Pressure Test Verified: Yes (01/26/16 0930)  Alarms Verified: Passed (01/26/16 0930)  Machine Temperature: 97.7 F (36.5 C) (01/26/16 0930)  Alarms Verified: Yes (01/26/16 0930)  Na+ mEq (Machine): 138 mEq (01/26/16 0930)  Bicarb mEq (Machine): 35 mEq (01/26/16 0930)  Hemodialysis Conductivity (Machine): 13.5 (01/26/16 0930)  Hemodialysis Conductivity (Meter): 13.6 (01/26/16 0930)  Dialyzer Lot Number: [id number] (01/26/16 0930)  Tubing Lot Number: [id number] (01/26/16 0930)  RO Machine Log Completed: Yes (01/26/16 0930)    Chlorine Testing:  RO/Hemodialysis Machine Safety Checks  Is Total Chlorine less than 0.1 ppm?: Yes (01/26/16 0930)  Orignial Total Chlorine Testing Time: 0845 (01/26/16 0930)  At 4 Hour Total Chlorine Testing Time: 1245 (01/26/16 0930)    Vitals and Weight:  Patient Vitals for the past 4 hrs:   BP Temp Pulse Resp   01/26/16 1509 (!) 187/107 98.3 F (36.8 C) (!) 105 20   01/26/16 1407 (!) 200/115 98 F (36.7 C) (!) 110 18   01/26/16 1400 (!) 204/113 - (!) 107 -   01/26/16 1330 (!) 193/110 - (!) 104 -   01/26/16 1300 (!) 184/111 - (!) 111 -   01/26/16 1230 (!) 196/106 - (!) 104 -   01/26/16 1200 (!) 164/108 - (!) 110 -        Dialysis Weight  Pre-Treatment Weight (Kg): 100.9 (01/26/16 0930)  Scale Type: Floor Scale (01/26/16 0930)       Treatment:        01/26/16 0945 01/26/16 0959 01/26/16 1031   Vitals   Temp 98.3 F (36.8 C) --  --    Heart Rate (!) 104 (!) 106 (!) 104  Resp Rate 18 --  --    BP (!) 182/109 --  (!) 179/111   Animal nutritionist --  --  Yes   Blood Flow Rate (mL/min) --  --  300 mL/min   Arterial Pressure (mmHg) --  --  -190 mmHg   Venous Pressure (mmHg) --  --  90   Dialysate Flow Rate  (mL/min) --  --  600 mL/min   Transmembrane Pressure (mmHg) --  --  90 mmHg   Ultrafiltration Rate (mL/Hr) --  --  1000 mL/hr   Fluid Removal (ml) --  --  0 ml   Fluid Bolus (ml) --  --  200 ml   Dialysate K (mEq/L) --  --  2 mEq/L   Dialysate CA (mEq/L) --  --  2.5 mEq/L   Hemodialysis Comments   Arteriovenous Lines Secure --  --  Yes   Comments --  --  HD initiated via (L) CVC with lines reversed to ensure adequate flow       01/26/16 1100 01/26/16 1130 01/26/16 1200   Vitals   Temp --  --  --    Heart Rate (!) 105 (!) 103 (!) 110   Resp Rate --  --  --    BP (!) 168/112 (!) 186/111 (!) 164/108   Animal nutritionist --  --  --    Blood Flow Rate (mL/min) 300 mL/min 300 mL/min 300 mL/min   Arterial Pressure (mmHg) -130 mmHg -130 mmHg -200 mmHg   Venous Pressure (mmHg) 120 120 160   Dialysate Flow Rate (mL/min) 600 mL/min 600 mL/min 600 mL/min   Transmembrane Pressure (mmHg) 90 mmHg 90 mmHg 90 mmHg   Ultrafiltration Rate (mL/Hr) 1000 mL/hr 1340 mL/hr 1740 mL/hr   Fluid Removal (ml) 567 ml 1153 ml 1742 ml   Fluid Bolus (ml) 0 ml 0 ml 200 ml   Dialysate K (mEq/L) --  --  --    Dialysate CA (mEq/L) --  --  --    Hemodialysis Comments   Arteriovenous Lines Secure Yes Yes Yes   Comments Goal increased 1 liter per Dr Kai Levins Pt remains stable Pt cramping in (L) leg . UF paused and NS given        01/26/16 1230   Vitals   Temp --    Heart Rate (!) 104   Resp Rate --    BP (!) 196/106   Animal nutritionist --    Blood Flow Rate (mL/min) 400 mL/min   Arterial Pressure (mmHg) -210 mmHg   Venous Pressure (mmHg) 170   Dialysate Flow Rate (mL/min) 600 mL/min   Transmembrane Pressure (mmHg) 70 mmHg   Ultrafiltration Rate (mL/Hr) 1740 mL/hr   Fluid Removal (ml) 2051 ml   Fluid Bolus (ml) 0 ml   Dialysate K (mEq/L) --    Dialysate CA (mEq/L) --    Hemodialysis Comments   Arteriovenous Lines Secure Yes   Comments Cramping relieved        01/26/16 1300 01/26/16 1330  01/26/16 1400   Vitals   Temp --  --  --    Heart Rate (!) 111 (!) 104 (!) 107   Resp Rate --  --  --    BP (!) 184/111 (!) 193/110 (!) 204/113   Machine Metrics   Blood Flow Rate (mL/min) 400 mL/min 400 mL/min 400 mL/min   Arterial Pressure (mmHg) -190 mmHg -190 mmHg -190  mmHg   Venous Pressure (mmHg) 160 170 200   Dialysate Flow Rate (mL/min) 600 mL/min 600 mL/min 600 mL/min   Transmembrane Pressure (mmHg) 70 mmHg 70 mmHg 70 mmHg   Ultrafiltration Rate (mL/Hr) 1740 mL/hr 1740 mL/hr 400 mL/hr   Fluid Removal (ml) 2090 ml 2362 ml 2500 ml   Fluid Bolus (ml) 0 ml 0 ml 300 ml   Hemodialysis Comments   Arteriovenous Lines Secure Yes Yes Yes   Comments Pt remains stable Pt remains stable HD completed. Blood returned. Pt stable       01/26/16 1407   Vitals   Temp 98 F (36.7 C)   Heart Rate (!) 110   Resp Rate 18   BP (!) 200/115   Machine Metrics   Blood Flow Rate (mL/min) --    Arterial Pressure (mmHg) --    Venous Pressure (mmHg) --    Dialysate Flow Rate (mL/min) --    Transmembrane Pressure (mmHg) --    Ultrafiltration Rate (mL/Hr) --    Fluid Removal (ml) --    Fluid Bolus (ml) --    Hemodialysis Comments   Arteriovenous Lines Secure --    Comments Post HD vs. Report called to PN          Intake/Output:  No intake/output data recorded.      Medications:  Current Facility-Administered Medications   Medication Dose Route Last Rate Last Dose   . heparin (porcine) injection 1,000-3,000 Units  1,000-3,000 Units Intracatheter       . sodium chloride 0.9 % bolus 100 mL  100 mL Other       . sodium chloride 0.9 % bolus 250 mL  250 mL Intravenous            Education:  Education  Person taught: Patient (01/26/16 1407)  Knowledge basis: Substantial (01/26/16 1407)  Topics taught: Access care (01/26/16 1407)  Teaching Tools: Explain (01/26/16 1407)  Patty SermonsTrenton Gammon Understanding (01/26/16 1407)    Primary Nurse Communication:  Bedside Nurse Communication  Name of bedside RN - pre dialysis: Charlestine Massed RN (01/26/16  0930)  Name of bedside RN - post dialysis: Charlestine Massed RN (01/26/16 1407)      [facility] nurse signature/date/time: Andre Lefort RN 01/26/2016@ 1541

## 2016-01-26 NOTE — ED Notes (Signed)
I faxed a release of information paper to [facility] and I got a confirmation paper saying they received it

## 2016-01-26 NOTE — H&P (Signed)
[facility]                                                                                                                                                                          HISTORY AND PHYSICAL      Primary Care Providers:  PCP: None    Chief Complaint:  SOB  Diarrhea    HPI:  [name] is a 45 y.o. African-American male, medical history of end-stage renal disease on hemodialysis over the past 7 years, with anemia, chronic systolic and diastolic congestive heart failure with EF of 35-40% and grade 2 diastolic dysfunction by 2-D echo in August 2017, essential hypertension, mild intermittent asthma, tobacco dependence via cigarette smoking, comes to [facility] as a transfer from [facility] for further management  Patient had presented to the outside facility with the above chief complaint  Patient resides in [place], and is in the area visiting his sister  Last hemodialysis was on Monday, January 19, 2016. As per patient, he made arrangements to have hemodialysis in this area whilst visiting his sister, but my have missed the phone call from his dialysis center to give him the details of the arrangement.  Patient describes multiple episodes of diarrhea which have been ongoing for the past 24-48 hours.  Patient states that he's had recurrent C. difficile diarrhea since the latter part of 2016. Patient states that it started after he was placed on antibiotics for pneumonia, and then also developed peritonitis from PD catheter placement and was also placed on antibiotics for that. He never had PPD and the catheter was removed secondary to the peritonitis.  On back care everywhere type on Epic, there is evidence of recurrent C. difficile positive test in the [facility] system with the first in November 2016,  then in June 2017 and most recently on January 25, 2016 which is within the past 24 hours. As per patient, he was prescribed antibiotics from the ER in [name] with his most recent C. difficile positive test but has not been able to fill the prescription  Presented to the outlying facility with elevated systolic blood pressure greater than 170.  Had a CT of the abdomen and pelvis done showing anasarca and volume overload  Chest x-ray showed a mild pulmonary edema  Blood work with creatinine of 11.37, with patient's baseline of around 7  Mild hyperkalemia 5.7  Patient received 5 mg IV hydralazine, 80 mg IV Lasix prior to transfer to this facility  Presented with a blood pressure of 184 / 116  Had one episode of loose watery diarrhea, nonbloody  Reviewed at bedside on arrival at this facility  Admission orders placed      Past  Medical History:   Past Medical History:   Diagnosis Date   . Asthma without status asthmaticus    . 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 Clostridium difficile diarrhea    . Drug-seeking behavior    . Hypertension    . Kidney failure        Past Surgical History:  Past Surgical History:   Procedure Laterality Date   . AV FISTULA PLACEMENT     . PERITONEAL CATHETER INSERTION         Allergies:  Allergies   Allergen Reactions   . Lisinopril Anaphylaxis   . Morphine Anaphylaxis   . Toradol [Ketorolac Tromethamine] Anaphylaxis       Outpatient Medications:  Current Facility-Administered Medications   Medication Dose Route Frequency Provider Last Rate Last Dose   . acetaminophen (TYLENOL) tablet 650 mg  650 mg Oral Q4H PRN Ester Rink, MD        Or   . acetaminophen (TYLENOL) 160 MG/5ML oral solution 650 mg  650 mg per NG tube Q4H PRN Ester Rink, MD        Or   . acetaminophen (TYLENOL) suppository 650 mg  650 mg Rectal Q4H PRN Ester Rink, MD       . albuterol (PROVENTIL) nebulizer solution 2.5 mg  2.5 mg Nebulization BID PRN Ester Rink, MD       . albuterol-ipratropium  (COMBIVENT RESPIMAT) inhaler 2 puff  2 puff Inhalation Q6H Ester Rink, MD       . amLODIPine (NORVASC) tablet 10 mg  10 mg Oral Daily Ester Rink, MD       . carvedilol (COREG) tablet 25 mg  25 mg Oral BID Ester Rink, MD       . fidaxomicin (DIFICID) tablet 200 mg  200 mg Oral BID Ester Rink, MD       . furosemide (LASIX) tablet 80 mg  80 mg Oral Daily Ester Rink, MD       . heparin (porcine) PF injection 5,000 Units  5,000 Units Subcutaneous Q12H [facility] Ester Rink, MD       . hydrALAZINE (APRESOLINE) injection 10 mg  10 mg Intravenous 4X Daily PRN Ester Rink, MD       . hydrALAZINE (APRESOLINE) tablet 100 mg  100 mg Oral TID Ester Rink, MD       . HYDROmorphone (DILAUDID) injection 0.2 mg  0.2 mg Intravenous Once Ester Rink, MD       . naloxone [facility]) injection 0.4 mg  0.4 mg Intravenous PRN Ester Rink, MD       . nitroglycerin (NITRO-BID) 2 % ointment 1 inch  1 inch Topical Q6H HOLD MN Ester Rink, MD       . ondansetron (ZOFRAN-ODT) disintegrating tablet 8 mg  8 mg Oral Q4H PRN Ester Rink, MD        Or   . ondansetron [facility]) injection 4 mg  4 mg Intravenous Q4H PRN Ester Rink, MD       . pantoprazole (PROTONIX) EC tablet 40 mg  40 mg Oral Daily Ester Rink, MD       . prochlorperazine (COMPAZINE) injection 10 mg  10 mg Intravenous Q4H PRN Ester Rink, MD       . sevelamer (RENVELA) tablet 800 mg  800 mg Oral TID MEALS Barnet Pall Gerome Apley, MD  Social History:  Social History   Substance Use Topics   . Smoking status: Current Every Day Smoker     Packs/day: 0.25     Types: Cigarettes     Last attempt to quit: 09/18/2015   . Smokeless tobacco: Former Neurosurgeon   . Alcohol use No       Illicit Drug Use:   Reviewed and negative    Designated Health Care Proxy:   Newt Minion, sister    Family History:  Family history was obtained and was non contributory for cardiac history, cancer history.     Review of Systems:  All systems reviewed including  eyes, ENT, cardiovascular, respiratory, gastrointestinal, genitourinary, psychiatric, neurologic, integumentary, allergic/hematology,  are reviewed and found unremarkable except pertinent positives mentioned in the history of present illness and past medical history.     Physical Exam:    Vital Signs:  BP (!) 184/116   Pulse (!) 102   Temp 98.3 F (36.8 C) (Oral)   Resp 20   Ht 1.854 m (6\' 1" )   Wt 100.9 kg (222 lb 6.4 oz)   SpO2 100%   BMI 29.34 kg/m   No intake or output data in the 24 hours ending 01/26/16 0449  Wt Readings from Last 4 Encounters:   01/26/16 100.9 kg (222 lb 6.4 oz)   01/25/16 105.7 kg (233 lb)   01/25/16 105.7 kg (233 lb)   01/06/16 105.7 kg (233 lb)         1) General Appearance:  Chronically ill-looking African-American male, in no apparent acute      cardiorespiratory nor painful distress. Looks way older than stated age    2) HEENT: Head is atraumatic and normocephalic.       Eyes: Pink conjunctiva, anicteric sclera. Pupils are equally reactive to light and      accommodation. Extraocular muscles are intact.      ENT:  Patient has intact external auditory canal. No abnormal lesions or bleeding from      nose. Oral mucosa moist with no pharyngeal congestion, erythema or swelling.    3) Neck: Supple, with full range of motion without any discomfort. Trachea is central,       no JVD noted, no carotid bruit, no cervical masses palpable.    4) Chest: Bibasilar crackles       HD catheter in right anterior chest wall without any abnormalities of insertion site    5) CVS:  S1, S2 normal intensity and regular rhythm. No murmurs, rubs or gallops appreciated.    6) Abdomen:  Soft, non tender, non distended, no palpable mass. Bowel sounds audible.      No costovertebral angle tenderness noted.    7) Extremities: 2+ pulses with bilateral lower extremity pitting edema, without cyanosis or clubbing.       Right upper extremity non-functioning AVF    8) Musculoskeletal: No joint deformities,  tenderness or swelling noted. Full range of      motion in all the joints examined.    9) Skin: Warm with normal skin turgor, no lesions seen.    10) Lymphatics: No lymphadenopathy in axillary, cervical and inguinal area.     11) Neurological: Alert and oriented x 4. Cranial nerves II-XII intact. No gross focal        neurological deficits noted.    12) Psychiatric:  Mood and affect are appropriate.     Labs: Reviewed by me    Labs Reviewed   VH C. DIFFICILE  TOXIN B GENE BY DNA AMPLIFICATION       EKG: Reviewed by me  None    Imaging Studies: Reviewed by me    CT Abdomen Pelvis dry   Clinical History:  Reason For Exam: vomiting, diarrhea, abd pain, h/o ESRD  pt c/o of stomach pain diarrhea and states that he has vomited four times today. Also states he got a call from [facility] hospital saying that he was positive for C. DIFF. Pt says that the symptoms started on Monday.    Examination:  CT of the abdomen and pelvis without contrast. Multiplanar reconstructions obtained.    CT images were acquired utilizing Automated Exposure Control for dose reduction.     Comparison:  None available.    Findings:  The exam is technically limited by lack of intravenous contrast.    Lower chest:  The lung bases are clear. No pleural effusions. Hyperaeration compatible with COPD. Cardiomegaly.    Abdomen:  Liver normal size, contour and density. No mass or biliary dilatation.    Gallbladder normal size and wall thickness without stones.    Pancreas, Spleen and Adrenal glands normal.    Kidneys normal size and contour. No hydronephrosis, mass or stones.    The stomach, duodenum and small bowel are normal in caliber, no gross inflammatory changes.    Colon normal caliber and wall thickness, no mass or gross inflammatory changes. Fluid throughout colon.    Small moderate complex abdominal pelvic free fluid 35 Hounsfield units. Moderate diffuse mesenteric edema. No obvious mass or pathologic adenopathy. Atherosclerotic  aorta and IVC are normal caliber.    IMPRESSION:   Volume overload with cardiomegaly, ascites, edema, anasarca.    Complex ascites.      XR Chest AP Portable   Clinical History:  Reason For Exam: missed HD x 2  stomach pain diarrhea and states that he has vomited four times today. Also states he got a call from [facility] hospital saying that he was positive for C DIFF. Pt says that the symptoms started on Monday. Pt does have a Dialysis port that is placed on the left side of his chest.    Examination:  Frontal view of the chest.    Comparison:  10/19/2015    IMPRESSION:   Left IJ double lumen dialysis catheter tip at superior cavoatrial junction.  Right subclavian vascular stent.  Cardiomegaly.  Mild pulmonary edema.  No focal consolidation or suspicious infiltrates.  Trace pleural effusions.      Assessment and Plan:    1. Volume overload from missed hemodialysis in patient with ESRD on HD with anemia  Evidenced clinically and radiographically  Due to medical non-compliance; patient has not had hemodialysis since Monday, January 19, 2016  Received IV Lasix with improvement in his symptoms  Monitor H&H  Resume outpatient phosphate binders  Renal consult to facilitate inpatient hemodialysis  Patient counseled on need to be compliant with medical therapy and asserts understanding    2. Recurrent Clostridium difficile diarrhea  Evident on Care Everywhere tab on EPIC from [facility]; tested positive in 02/2015, 09/2015 and on 01/25/2016  As per patient, his had more than 10 infections since last year  Repeat C. Difficile workup ordered  Will start patient on Fidaxomicin  Contact isolation  Patient likely secondary candidate for fecal transplant and will need to follow-up at the appropriate facility on discharge    3. Accelerated essential HTN  Due to medical non-compliance  Resume outpatient medications  Topical nitroglycerin for now  IV hydralazine as needed  Telemetry monitoring  Patient counseled on need to be  compliant with medical therapy and asserts understanding    4. Systolic and diastolic CHF, chronic  Already received IV Lasix prior to transfer  Will continue with by mouth Lasix  Resume outpatient Coreg  Intake and output monitoring, daily weight  Telemetry monitoring    5. Mild intermittent asthma with active tobacco smoking  Stable without exacerbation on admission  Resume outpatient albuterol therapy  DuoNeb and oxygen as needed  Nicotine replacement therapy ordered  Tobacco cessation counseling done for 3-5 minutes    Plan of care discussed with patient and is in agreement as outlined, with verbalization of understanding and agreement    Disposition: Inpatient admission                        Nephrology consulted; Dr Kai Levins    GI Prophylaxis: Protonix    DVT Prophylaxis: Heparin    CODE Status : FULL CODE    Ester Rink, MD  01/26/2016    4:49 AM      Note: This chart was generated by the Epic EMR system/speech recognition and may contain inherent errors or omissions not intended by the user. Grammatical errors, random word insertions, deletions, pronoun errors and incomplete sentences are occasional consequences of this technology due to software limitations. Not all errors are caught or corrected. If there are questions or concerns about the content of this note or information contained within the body of this dictation they should be addressed directly with the author for clarification

## 2016-01-26 NOTE — ED Notes (Signed)
I sent a copy of the face sheet and the transfer summary with the vmt crew

## 2016-01-26 NOTE — Progress Notes (Signed)
Discussed with pt B/P meds. States that B/P does drop significantly with HD and holds all AM B/P meds. Hydralazine held this AM. Will continue to monitor.

## 2016-01-26 NOTE — ED Notes (Signed)
[facility] Elon Jester Zvoit.

## 2016-01-26 NOTE — Progress Notes (Signed)
CM discussed plan of care w/ Pt, Dr. Kai Levins and Dr. Minus Liberty at length.     Dr. Kai Levins reported to CM that Pt had not been receiving HD from [facility] since June, 2017. It is unclear where pt has been receiving HD. CM called [facility] headquarters at [phone] and discussed case at length. It was stated that Pt had been wanting to transfer HD clinics and that this was set up for him back in Oct. 2nd for him to go to [facility] [place], however Pt did not show up for his appt. Pt was recently at [facility] where he received care and HD; uncertain of date of service. Pt offering conflicting and misleading information to CM. CM spoke w Pt and explained that an HD chair will need to be set up for him again. He was in agreement with this plan. CM asked Pt "what is your preference of HD facilities and area?" He also wants a 3rd shift chair time as he works during the day. Pt reports he would like to be in the [facility], [place] area or [place] area. CM called [facility] again and asked them to look for an HD chair in these areas. There are no 3rd shift HD chairs in [place] area and none in [place], [place]. [facility] is in [place]. CM spoke w/ Pt regarding this option. He asked CM to inquire of Nocturnal Clinics in those areas. He verbalized that if there are no Nocturnal clinics then he will accept the [facility] [place] [facility]. CM called [facility] and spoke w/ [place]. She reports there are no Nocturnal clinics in the above mentioned areas. CM requested a Mon-Wed-Fri HD chair in [place], [place] for 4:30pm or later. [place] reports that she is "submitting the info today" and that this CM will get a return call to confirm HD chair time and place.     CM is hopeful that this request will be approved and that Pt will be ready for [place] [facility] tomorrow with a next HD day on Wed Oct. 11th. Awaiting confirmation prior to discharging this Pt. Pt will also need oral abx at time of   to continue to treat his C-Diff.     Aisea Bouldin RNCM  [phone]

## 2016-01-26 NOTE — Consults (Signed)
Inpatient consult to nephrology  Consult performed by: Versie Starks  Consult ordered by: Ester Rink          CONSULTATION    Date Time: 01/26/16 8:35 AM  Patient Name: [name], [name]  MRN#: [id number]  DOB: May 01, 1970  Requesting Physician: Otho Ket, MD I was contacted directly by Dr. Barnet Pall who requested this consult.      Reason for Consultation:   ESRD and management of dialysis needs    History:   [name] is a 45 y.o. male who presents to the hospital on 01/26/2016 with complaints of diarrhea and shortness of breath. The patient has a history of end-stage renal disease, on chronic hemodialysis reportedly at the [facility] dialysis center per the patient. Upon contacting that dialysis unit, however, they report that the patient has not been there for treatment since June. When confronting the patient with this information, he then relates that he's been "in and out of the hospital" but doesn't believe that it's been that long that he attended his outpatient center. His last dialysis session was 1 week ago at [facility] in [place], where he relates he was hospitalized for "a couple weeks." He was running on a Monday Wednesday Friday schedule while hospitalized. The patient has a left upper arm AV fistula, but states that the access "doesn't work" and hence also has a Comptroller which has been used for dialysis. The patient has had problems with severe diarrhea over the past week, having multiple episodes of watery stool daily. He additionally relates nausea and vomiting. He has noticed increasing shortness of breath. He is in the local area visiting his sister, but had not confirmed any arrangements for dialysis (was "waiting a call back" from [facility]). He reports making very little urine at this time. He was having subjective fever and chills. He states that he received a phone call from an ER near [place] informing him of a positive C difficile test, but he had not filled his  prescription for any antibiotics.    Past Medical History:     Past Medical History:   Diagnosis Date   . Asthma without status asthmaticus    . 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 Clostridium difficile diarrhea    . Drug-seeking behavior    . Hypertension    . Kidney failure        Past Surgical History:     Past Surgical History:   Procedure Laterality Date   . AV FISTULA PLACEMENT     . PERITONEAL CATHETER INSERTION         Family History:     Family History   Problem Relation Age of Onset   . Hypertension Mother        Social History:     Social History     Social History   . Marital status: Single     Spouse name: N/A   . Number of children: N/A   . Years of education: N/A     Social History Main Topics   . Smoking status: Current Every Day Smoker     Packs/day: 0.25     Types: Cigarettes     Last attempt to quit: 09/18/2015   . Smokeless tobacco: Former Neurosurgeon   . Alcohol use No   . Drug use: No   . Sexual activity: Not on file     Other Topics Concern   . Not on file     Social  History Narrative   . No narrative on file       Allergies:     Allergies   Allergen Reactions   . Lisinopril Anaphylaxis   . Morphine Anaphylaxis   . Toradol [Ketorolac Tromethamine] Anaphylaxis       Medications:     Current Facility-Administered Medications   Medication Dose Route Frequency   . albuterol-ipratropium  2 puff Inhalation Q6H   . amLODIPine  10 mg Oral Daily   . carvedilol  25 mg Oral Q12H [facility]   . fidaxomicin  200 mg Oral Q12H [facility]   . furosemide  80 mg Oral Daily   . heparin (porcine)  5,000 Units Subcutaneous Q12H [facility]   . hydrALAZINE  100 mg Oral Q8H SCH   . nitroglycerin  1 inch Topical Q6H HOLD MN   . pantoprazole  40 mg Oral QAM AC   . sevelamer  800 mg Oral TID MEALS       Review of Systems:   A comprehensive review of systems was: Notable for that which is detailed in HPI; all remaining systems reviewed and negative    Physical Exam:   BP (!) 165/93   Pulse (!) 106   Temp 97.4 F (36.3 C) (Oral)   Resp 18    Ht 1.854 m (6\' 1" )   Wt 100.9 kg (222 lb 6.4 oz)   SpO2 100%   BMI 29.34 kg/m     Intake and Output Summary (Last 24 hours) at Date Time  No intake or output data in the 24 hours ending 01/26/16 0835    General appearance - alert, in no distress  Mental status - affect appropriate to mood  Eyes - sclera anicteric  Mouth - mucous membranes moist  Neck - supple  Chest -  good respiratory effort, decreased breath sounds  Heart - normal rate and regular rhythm, no rub appreciated  Abdomen - soft, nondistended, bowel sounds present  Neurological - motor and sensory grossly normal bilaterally  Extremities - 1+ edema  Skin - warm and dry   Left upper arm AV fistula with faint bruit    Labs Reviewed:     Results     ** No results found for the last 24 hours. **        Labs from [facility] reviewed.    Rads:   Radiological Procedure reviewed.     Assessment:     1. 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 On chronic hemodialysis, sporadically. By report of his outpatient dialysis center, he has not been there for a treatment since June. The patient relates his most recent dialysis occurred during his hospitalization at [facility] in [place].  2. Nonadherence to prescribed dialysis regimen  3. Hyperkalemia-associated with this dialysis treatment  4. Volume overload-due to missed dialysis  5. Anemia of ESRD  6. C. difficile diarrhea  7. Hypertension-suspect due to nonadherence to medication regimen as well as volume overload    Plan:      The patient will undergo hemodialysis today, and then will be maintained on a Monday Wednesday Friday schedule thereafter during the course of his hospitalization   Anticipate mild hyperkalemia should resolve with hemodialysis today   Will ultrafilter as tolerated by blood pressure    Will review outpatient standing orders from his facility in [name] to determine EPO/activated vitamin D needs   Renal diet and fluid restriction should be instituted   Other medications should be dose  adjusted for ESRD   Resume outpatient  antihypertensive medications, and assess response to ultrafiltration with dialysis   The patient was counseled about the importance of adherence to his prescribed dialysis regimen, and the adverse consequences of not doing so. He verbalizes understanding.    Thank you for the opportunity to participate in the care of your patient. We will follow with you.    Signed by: Versie Starks, MD

## 2016-01-26 NOTE — ED Notes (Signed)
I called vmt to set up transport for pt. To be transferred to [id number] and I spoke with jaime at dispatch and she gave me an eta [phone]

## 2016-01-26 NOTE — Progress Notes (Signed)
Patient seen while on hemodialysis  No complaints at this time  BP 186/111 P 103  Dialysis flow sheet reviewed, BFR 300 ml/min  UF to target weight as tolerated by BP  Outpatient orders reviewed - on oral calcitriol every MWF; no ESA at present  Stable hemodialysis - remainder as per my earlier note

## 2016-01-26 NOTE — ED Notes (Signed)
Report given to [facility] team

## 2016-01-26 NOTE — ED Provider Notes (Signed)
Physician/Midlevel provider first contact with patient: 01/25/16 2344         [facility] EMERGENCY DEPARTMENT History and Physical Exam      Patient Name: [name], [name]  Encounter Date:  01/25/2016  Attending Physician: Justice Britain, MD  PCP: Christa See, MD  Patient DOB:  [birth year]  MRN:  [id number]  Room:  E12/EDA12-A      History of Presenting Illness     Chief complaint: Abdominal Pain    HPI/ROS is limited by: none  HPI/ROS given by: patient    Location: diffuse  Duration: 1 week  Severity: moderate    [name] is a 45 y.o. male who presents with reported abdominal pain, vomiting and diarrhea for 1 week.  Pt can provide few other details.  He reports he was tested for c diff sometime earlier in the week and was told it was positive at [facility].  He reports other prior episodes of c diff.  He states he gets dialysis 3 times/week but missed it twice due to illness.  He is noted to have left another ED earlier today AMA after being refused narcotics.      Review of Systems     Review of Systems   Constitutional: Negative for fever.   Gastrointestinal: Positive for abdominal pain, diarrhea, nausea and vomiting.   Genitourinary: Negative for dysuria.   All other systems reviewed and are negative.      Allergies     Pt is allergic to lisinopril; morphine; and toradol [ketorolac tromethamine].    Medications     No current facility-administered medications for this encounter.     Current Outpatient Prescriptions:   .  acetaminophen (TYLENOL) 325 MG tablet, Take 2 tablets (650 mg total) by mouth every 4 (four) hours as needed for Pain., Disp: , Rfl:   .  albuterol (PROVENTIL) (2.5 MG/3ML) 0.083% nebulizer solution, Take 2.5 mg by nebulization 2 (two) times daily as needed.  , Disp: , Rfl:   .  albuterol-ipratropium (COMBIVENT RESPIMAT) 20-100 MCG/ACT Aero Soln, Inhale 2 puffs into the lungs daily.  , Disp: , Rfl:   .  amLODIPine (NORVASC) 10 MG tablet, Take 10 mg by mouth daily.  ,  Disp: , Rfl:   .  calcium carbonate (TUMS) 500 MG chewable tablet, Chew 2 tablets (1,000 mg total) by mouth 2 (two) times daily., Disp: , Rfl:   .  carvedilol (COREG) 25 MG tablet, Take 25 mg by mouth 2 (two) times daily., Disp: , Rfl:   .  furosemide (LASIX) 80 MG tablet, Take 80 mg by mouth daily., Disp: , Rfl:   .  hydrALAZINE (APRESOLINE) 100 MG tablet, Take 100 mg by mouth 3 (three) times daily., Disp: , Rfl:   .  pantoprazole (PROTONIX) 40 MG tablet, Take 40 mg by mouth daily., Disp: , Rfl:   .  sevelamer (RENVELA) 800 MG tablet, Take 800 mg by mouth 3 (three) times daily with meals., Disp: , Rfl:      Past Medical History     Pt has a past medical history of Asthma without status asthmaticus; Cardiac LV ejection fraction 21-40%; Clostridium difficile diarrhea; Drug-seeking behavior; Hypertension; and Kidney failure.    Past Surgical History     Pt has a past surgical history that includes AV fistula placement and Peritoneal catheter insertion.    Family History     The family history includes Hypertension in his mother.    Social History  Pt reports that he has been smoking Cigarettes.  He has been smoking about 0.25 packs per day. He has quit using smokeless tobacco. He reports that he does not drink alcohol or use drugs.    Physical Exam     Blood pressure 187/78, pulse (!) 101, temperature 98.6 F (37 C), temperature source Oral, resp. rate 19, height 1.854 m, weight 105.7 kg, SpO2 99 %.    Constitutional: Vital signs reviewed. Well appearing.  Head: Normocephalic, atraumatic  Eyes: Conjunctiva and sclera are normal.  No injection or discharge.  Ears, Nose, Throat:  Normal external examination of the nose and ears.    Neck: Normal range of motion. Trachea midline.  Respiratory/Chest: Clear to auscultation. No respiratory distress.   Cardiovascular: Regular rate and rhythm.  Abdomen:  No rebound or guarding. Soft.  Non-tender.  Back: no cva tenderness    Upper Extremity:  No edema. No cyanosis.  Lower  Extremity:  No edema. No cyanosis.  Skin: Warm and dry. No rash.  Psychiatric:  Normal affect.  Normal insight  Neuro: alert and oriented    Orders Placed     Orders Placed This Encounter   Procedures   . C diff Toxin B Gene by DNA Amplification   . CT Abdomen Pelvis dry  (Stone)   . XR Chest AP Portable   . CBC   . CMP   . Lipase   . Remove Contact Isolation and Discontinue Contact Special Isolation Status order if the C Diff test is negatve.  Call physician if no C. Diff test performed due to resolution of symptoms after 24 hours.   . Contact Special Isolation Status       Diagnostic Results       The results of the diagnostic studies below have been reviewed by myself:    Labs  Results     Procedure Component Value Units Date/Time    CMP [[id number]]  (Abnormal) Collected:  01/26/16 0000    Specimen:  Plasma Updated:  01/26/16 0028     Sodium 140 mMol/L      Potassium 5.7 (H) mMol/L      Chloride 107 mMol/L      CO2 21.4 mMol/L      Calcium 7.9 (L) mg/dL      Glucose 83 mg/dL      Creatinine 82.95 (H) mg/dL      BUN 58 (H) mg/dL      Protein, Total 6.3 gm/dL      Albumin 2.6 (L) gm/dL      Alkaline Phosphatase 218 (H) U/L      ALT 8 U/L      AST (SGOT) 15 U/L      Bilirubin, Total 0.6 mg/dL      Albumin/Globulin Ratio 0.72 Ratio      Anion Gap 17.5 mMol/L      BUN/Creatinine Ratio 5.1 (L) Ratio      EGFR 6 (L) mL/min/1.63m2      Osmolality Calc 295 mOsm/kg      Globulin 3.7 gm/dL     Lipase [[id number]] Collected:  01/26/16 0000    Specimen:  Plasma Updated:  01/26/16 0028     Lipase 74 U/L     CBC [[id number]]  (Abnormal) Collected:  01/26/16 0000    Specimen:  Blood from Blood Updated:  01/26/16 0015     WBC 6.1 K/cmm      RBC 3.20 (L) M/cmm      Hemoglobin 9.9 (L) gm/dL  Hematocrit 30.0 (L) %      MCV 94 fL      MCH 31 pg      MCHC 33 gm/dL      RDW 10.2 (H) %      PLT CT 145 K/cmm      MPV 5.6 (L) fL      NEUTROPHIL % 79.8 (H) %      Lymphocytes 8.9 (L) %      Monocytes 7.5 %      Eosinophils % 2.8 %       Basophils % 1.0 %      Neutrophils Absolute 4.9 K/cmm      Lymphocytes Absolute 0.5 (L) K/cmm      Monocytes Absolute 0.5 K/cmm      Eosinophils Absolute 0.2 K/cmm      BASO Absolute 0.1 K/cmm           Radiologic Studies  Radiology Results (24 Hour)     Procedure Component Value Units Date/Time    XR Chest AP Portable [[id number]] Collected:  01/26/16 0134    Order Status:  Completed Updated:  01/26/16 0139    Narrative:       Clinical History:  Reason For Exam: missed HD x 2  stomach pain diarrhea and states that he has vomited four times today. Also states he got a call from [facility] hospital saying that he was positive for C DIFF. Pt says that the symptoms started on Monday. Pt does have a Dialysis port that is placed   on   the left side of his chest.    Examination:  Frontal view of the chest.    Comparison:  10/19/2015      Impression:       Left IJ double lumen dialysis catheter tip at superior cavoatrial junction.  Right subclavian vascular stent.  Cardiomegaly.  Mild pulmonary edema.  No focal consolidation or suspicious infiltrates.  Trace pleural effusions.    ReadingStation:[id number]    CT Abdomen Pelvis dry  Larina Bras) [[id number]] Collected:  01/26/16 0045    Order Status:  Completed Updated:  01/26/16 0054    Narrative:       Clinical History:  Reason For Exam: vomiting, diarrhea, abd pain, h/o ESRD  pt c/o of stomach pain diarrhea and states that he has vomited four times today. Also states he got a call from [facility] hospital saying that he was positive for C. DIFF. Pt says that the symptoms started on Monday.    Examination:  CT of the abdomen and pelvis without contrast. Multiplanar reconstructions obtained.    CT images were acquired utilizing Automated Exposure Control for dose reduction.     Comparison:  None available.    Findings:  The exam is technically limited by lack of intravenous contrast.    Lower chest:  The lung bases are clear. No pleural effusions. Hyperaeration compatible with  COPD. Cardiomegaly.    Abdomen:  Liver normal size, contour and density. No mass or biliary dilatation.    Gallbladder normal size and wall thickness without stones.    Pancreas, Spleen and Adrenal glands normal.    Kidneys normal size and contour. No hydronephrosis, mass or stones.    The stomach, duodenum and small bowel are normal in caliber, no gross inflammatory changes.    Colon normal caliber and wall thickness, no mass or gross inflammatory changes. Fluid throughout colon.    Small moderate complex abdominal pelvic free fluid 35 Hounsfield units. Moderate diffuse mesenteric  edema. No obvious mass or pathologic adenopathy. Atherosclerotic aorta and IVC are normal caliber.    Pelvis:  Solid organs and bladder unremarkable.  No mass, pathologic adenopathy or free fluid. No inflammatory change.    Bones and Soft Tissues:  Anasarca. Postoperative changes ventral hernia repair.  Degenerative changes spine.      Impression:       Volume overload with cardiomegaly, ascites, edema, anasarca.    Complex ascites.    ReadingStation:[email or web address]          EKG: none      MDM / Critical Care     Blood pressure 187/78, pulse (!) 101, temperature 98.6 F (37 C), temperature source Oral, resp. rate 19, height 1.854 m, weight 105.7 kg, SpO2 99 %.    DDX includes c diff, hyperkalemia, hypertension among others.    ED Course     ED eval notable for hypertension, mild hyperkalemia.  Pt reports missed HD twice.  Records report sent regarding positive C diff test, nothing received at this time.  Pt d/w Dr Barnet Pall, [facility], regarding transfer for HD.  She requests nitropaste, hydralazine, amlodipine.  EJ inserted in ED.    Procedures         Diagnosis / Disposition     Clinical Impression  1. Hypertensive urgency    2. Hyperkalemia    3. Abdominal pain, unspecified abdominal location    4. Diarrhea, unspecified type        Disposition  ED Disposition     ED Disposition Condition Date/Time Comment    Transfer to [facility] Jan 26, 2016  1:38 AM           Prescriptions  New Prescriptions    No medications on file                  Justice Britain, MD  01/26/16 [phone]

## 2016-01-26 NOTE — ED Notes (Signed)
I received a bed for pt. To be transferred to [facility] for hypertensive urgency, hyperkalemia, abd. Pain, and diarrhea

## 2016-01-26 NOTE — Progress Notes (Signed)
..  [facility]   [address]   [place] [address]     INITIAL ASSESSMENT  Case Management       Estimated D/C Date:     01/27/16   RX Coverage:       Yes   Inpatient Plan of Care:      Pt was a transfer from [facility] needing HD. He reports he was traveling and missed his HD chair. Admitted w/ SOB.    CM Interventions:      Cm met w/ Pt. He is indep w/ self care. He does drive. Pt wears O2 cont. He has a portable tank in his vehicle. His vehicle is at [facility]. Pt reports he has no funds or means to get to his vehicle. Pt will likely need VMT wheelchair transport w/ O2 provided in order to get to [facility]. Cm following.         01/26/16 1010   Patient Type   Within 30 Days of Previous Admission? Yes  (Pt was a transfer from [facility] to [facility] for HD needs. )   Healthcare Decisions   Interviewed: Patient   Orientation/Decision Making Abilities of Patient Alert and Oriented x3, able to make decisions   Prior to admission   Prior level of function Independent with ADLs   Type of Residence Private residence  (No steps to enter apartment)   Home Layout One level   Have running water, electricity, heat, etc? Yes   Living Arrangements Alone   How do you get to your MD appointments? self   How do you get your groceries? self   Who fixes your meals? self   Who does your laundry? self   Who picks up your prescriptions? self   Dressing Independent   Grooming Independent   Feeding Independent   Bathing Independent   Toileting Independent   DME Currently at Home (Home O2 w/ portability 2-3L Cont NC. Pt does not recal where he gets his O2 from. )   Home Care/Community Services Dialysis   Type of Dialysis: Hemodialysis   Where does patient receive dialysis? [facility] in [place]   How is patient transported to dialysis? Private car (family member)   What is weekly dialysis schedule? Mon-Wed-Fri   Discharge Planning   Support Systems Family members  (Pt  reports having family support in the [place] area. )   Anticipated [place] plan discussed with: Same as interviewed   Mode of transportation: Private car (family member)   Consults/Providers   Correct PCP listed in Epic? (Has no PCP; has a nephrologist. Insured w/ Rx coverage)     Cincere Zorn RNCM  [phone]

## 2016-01-26 NOTE — Plan of Care (Signed)
Problem: Renal Instability  Goal: Fluid and electrolyte balance are achieved/maintained  Outcome: Progressing   01/26/16 0415   Goal/Interventions addressed this shift   Fluid and electrolyte balance are achieved/maintained  Monitor intake and output every shift;Provide adequate hydration;Assess and reassess fluid and electrolyte status;Observe for seizure activity and initiate seizure precautions if indicated;Monitor for muscle weakness;Monitor/assess lab values and report abnormal values;Monitor daily weight;Follow fluid restrictions/IV/PO parameters       Problem: Patient Receiving Advanced Renal Therapies  Goal: Therapy access site remains intact  Outcome: Progressing   01/26/16 0415   Goal/Interventions addressed this shift   Therapy access site remains intact Assess therapy access site

## 2016-01-26 NOTE — Plan of Care (Signed)
Problem: Pain interferes with ability to perform ADL  Goal: Pain at adequate level as identified by patient  Outcome: Progressing   01/26/16 0416   Goal/Interventions addressed this shift   Pain at adequate level as identified by patient Identify patient comfort function goal;Assess for risk of opioid induced respiratory depression, including snoring/sleep apnea. Alert healthcare team of risk factors identified.;Reassess pain within 30-60 minutes of any procedure/intervention, per Pain Assessment, Intervention, Reassessment (AIR) Cycle;Evaluate patient's satisfaction with pain management progress;Include patient/patient care companion in decisions related to pain management as needed;Offer non-pharmacological pain management interventions;Evaluate if patient comfort function goal is met

## 2016-01-27 ENCOUNTER — Inpatient Hospital Stay: Payer: Medicare Other

## 2016-01-27 LAB — CBC AND DIFFERENTIAL
Basophils %: 0.6 % (ref 0.0–3.0)
Basophils Absolute: 0 10*3/uL (ref 0.0–0.3)
Eosinophils %: 2.6 % (ref 0.0–7.0)
Eosinophils Absolute: 0.2 10*3/uL (ref 0.0–0.8)
Hematocrit: 26.9 % — ABNORMAL LOW (ref 39.0–52.5)
Hemoglobin: 8.8 gm/dL — ABNORMAL LOW (ref 13.0–17.5)
Lymphocytes Absolute: 0.5 10*3/uL — ABNORMAL LOW (ref 0.6–5.1)
Lymphocytes: 8.5 % — ABNORMAL LOW (ref 15.0–46.0)
MCH: 31 pg (ref 28–35)
MCHC: 33 gm/dL (ref 32–36)
MCV: 94 fL (ref 80–100)
MPV: 7.7 fL (ref 6.0–10.0)
Monocytes Absolute: 0.5 10*3/uL (ref 0.1–1.7)
Monocytes: 7.8 % (ref 3.0–15.0)
Neutrophils %: 80.6 % — ABNORMAL HIGH (ref 42.0–78.0)
Neutrophils Absolute: 4.8 10*3/uL (ref 1.7–8.6)
PLT CT: 136 10*3/uL (ref 130–440)
RBC: 2.86 10*6/uL — ABNORMAL LOW (ref 4.00–5.70)
RDW: 15.3 % — ABNORMAL HIGH (ref 11.0–14.0)
WBC: 5.9 10*3/uL (ref 4.0–11.0)

## 2016-01-27 LAB — BASIC METABOLIC PANEL
Anion Gap: 16.9 mMol/L (ref 7.0–18.0)
BUN / Creatinine Ratio: 4.6 Ratio — ABNORMAL LOW (ref 10.0–30.0)
BUN: 34 mg/dL — ABNORMAL HIGH (ref 7–22)
CO2: 19.5 mMol/L — ABNORMAL LOW (ref 20.0–30.0)
Calcium: 7.5 mg/dL — ABNORMAL LOW (ref 8.5–10.5)
Chloride: 105 mMol/L (ref 98–110)
Creatinine: 7.4 mg/dL — ABNORMAL HIGH (ref 0.80–1.30)
EGFR: 9 mL/min/{1.73_m2} — ABNORMAL LOW (ref 60–150)
Glucose: 74 mg/dL (ref 70–99)
Osmolality Calc: 278 mOsm/kg (ref 275–300)
Potassium: 5.4 mMol/L — ABNORMAL HIGH (ref 3.5–5.3)
Sodium: 136 mMol/L (ref 136–147)

## 2016-01-27 MED ORDER — FIRST-VANCOMYCIN 50 50 MG/ML PO SOLN
500.0000 mg | Freq: Four times a day (QID) | ORAL | Status: DC
Start: 2016-01-27 — End: 2016-02-06
  Administered 2016-01-27 – 2016-02-06 (×35): 500 mg via ORAL
  Filled 2016-01-27 (×41): qty 10

## 2016-01-27 MED ORDER — HYDROMORPHONE HCL 2 MG/ML IJ SOLN
0.5000 mg | INTRAMUSCULAR | Status: DC | PRN
Start: 2016-01-27 — End: 2016-02-04
  Administered 2016-01-27 – 2016-02-04 (×58): 0.5 mg via INTRAVENOUS
  Filled 2016-01-27 (×59): qty 1

## 2016-01-27 MED ORDER — CLONIDINE HCL 0.2 MG PO TABS
0.2000 mg | ORAL_TABLET | Freq: Two times a day (BID) | ORAL | Status: DC
Start: 2016-01-27 — End: 2016-01-29

## 2016-01-27 NOTE — Plan of Care (Addendum)
Problem: Renal Instability  Goal: Fluid and electrolyte balance are achieved/maintained  Outcome: Progressing   01/27/16 0343   Goal/Interventions addressed this shift   Fluid and electrolyte balance are achieved/maintained  Monitor intake and output every shift;Monitor/assess lab values and report abnormal values;Follow fluid restrictions/IV/PO parameters       Problem: Patient Receiving Advanced Renal Therapies  Goal: Therapy access site remains intact  Outcome: Progressing   01/27/16 0343   Goal/Interventions addressed this shift   Therapy access site remains intact Assess therapy access site       Problem: Pain interferes with ability to perform ADL  Goal: Pain at adequate level as identified by patient  Outcome: Not Progressing   01/27/16 0343   Goal/Interventions addressed this shift   Pain at adequate level as identified by patient Identify patient comfort function goal;Evaluate if patient comfort function goal is met     10/9 2130  Pt demanding frequency of IV dilaudid be changed.  MD contacted.  Refuses PRN Percocet.  Refuses  clonidine for BP.  PRN Hydralazine admin.    4:38 AM  Pt has been resting soundly w/snoring observed, however, wakes 5 minutes before his next IV dilaudid dose can be administered and rings for it.  This last admin, pt was sitting up on bed, snoring, w/eyes closed.  Pt laid down after med admin.    C/O abd pain 10:10 w/o improvement but states it helps.

## 2016-01-27 NOTE — Consults (Signed)
CONSULTATION NOTE    Date Time: 01/27/16 5:13 PM  Patient Name: [name]  Attending Physician: Otho Ket, MD  Primary Care Physician: Christa See, MD    Requesting Physician: Dr. Minus Liberty  Reason for Consultation: c . Difficile colitis     Impression:   1. 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 ESRD - on HD  3. Medical noncompliance - documented in the chart  4. Hyperkalemia episodes with missed dialysis sessions  5. HTN  6. History c diff 02/2015, 09/2015, and 01/25/2016  7. asthma      Recommendations:   1. Vancomycin 500mg  po qid x 7 days , then 250mg  qid x 14 days, then 250mg   tid x 14 days, then 250mg  bid x 14 days   2.  If failure to respond to <5 stools/day in the next one week - refer to GI for consideration fecal transplant   Thanks for the consult!       History of Presenting Illness:   45yo male here with reported refractory c diff x 4 times past year   Says he is only treated with short 10 days courses of therapy and gets better but soon after he stops the medications - po vancomyicn or flagyl - the diarrhea eventually returns  He is interestingly requesting a fecal transplant ; admits that he has never completed a taper of po vancomycin    Past Medical History:     Past Medical History:   Diagnosis Date   . Asthma without status asthmaticus    . 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 Clostridium difficile diarrhea    . Drug-seeking behavior    . Hypertension    . Kidney failure        Past Surgical History:     Past Surgical History:   Procedure Laterality Date   . AV FISTULA PLACEMENT     . PERITONEAL CATHETER INSERTION         Family History:     Family History   Problem Relation Age of Onset   . Hypertension Mother        Social History:     Social History     Social History   . Marital status: Single     Spouse name: N/A   . Number of children: N/A   . Years of education: N/A     Occupational History   . Not on file.     Social History Main Topics   .  Smoking status: Current Every Day Smoker     Packs/day: 0.25     Types: Cigarettes     Last attempt to quit: 09/18/2015   . Smokeless tobacco: Former Neurosurgeon   . Alcohol use No   . Drug use: No   . Sexual activity: Not on file     Other Topics Concern   . Not on file     Social History Narrative   . No narrative on file       Allergies:     Allergies   Allergen Reactions   . Lisinopril Anaphylaxis   . Morphine Anaphylaxis   . Toradol [Ketorolac Tromethamine] Anaphylaxis       Medications:     Current Facility-Administered Medications   Medication Dose Route Frequency Provider Last Rate Last Dose   . acetaminophen (TYLENOL) tablet 650 mg  650 mg Oral Q4H PRN Ester Rink, MD  Or   . acetaminophen (TYLENOL) 160 MG/5ML oral solution 650 mg  650 mg per NG tube Q4H PRN Ester Rink, MD        Or   . acetaminophen (TYLENOL) suppository 650 mg  650 mg Rectal Q4H PRN Ester Rink, MD       . albuterol (PROVENTIL) nebulizer solution 2.5 mg  2.5 mg Nebulization BID PRN Ester Rink, MD       . albuterol-ipratropium (COMBIVENT RESPIMAT) inhaler 2 puff  2 puff Inhalation Q6H Ester Rink, MD   2 puff at 01/27/16 1650   . amLODIPine (NORVASC) tablet 10 mg  10 mg Oral Daily Ester Rink, MD   10 mg at 01/27/16 0930   . calcitRIOL (ROCALTROL) capsule 0.5 mcg  0.5 mcg Oral Mon-Wed-Fri Versie Starks, MD   0.5 mcg at 01/26/16 1623   . carvedilol (COREG) tablet 25 mg  25 mg Oral Q12H [facility] Ester Rink, MD   25 mg at 01/27/16 0840   . cloNIDine (CATAPRES) tablet 0.2 mg  0.2 mg Oral Q12H Rahman, Ashfiqur, MD       . dicyclomine (BENTYL) tablet 20 mg  20 mg Oral QID Otho Ket, MD   20 mg at 01/27/16 1701   . famotidine (PEPCID) tablet 20 mg  20 mg Oral Daily Otho Ket, MD   20 mg at 01/27/16 0930   . furosemide (LASIX) tablet 80 mg  80 mg Oral Daily Ester Rink, MD   80 mg at 01/27/16 0930   . heparin (porcine) injection 1,000-3,000 Units  1,000-3,000 Units Intracatheter PRN Versie Starks, MD        . heparin (porcine) PF injection 5,000 Units  5,000 Units Subcutaneous Q12H [facility] Ester Rink, MD       . hydrALAZINE (APRESOLINE) injection 10 mg  10 mg Intravenous 4X Daily PRN Ester Rink, MD   10 mg at 01/27/16 0426   . hydrALAZINE (APRESOLINE) tablet 100 mg  100 mg Oral Q8H SCH Ester Rink, MD   100 mg at 01/27/16 1503   . HYDROmorphone (DILAUDID) injection 0.5 mg  0.5 mg Intravenous Q3H PRN Otho Ket, MD       . naloxone [facility]) injection 0.4 mg  0.4 mg Intravenous PRN Ester Rink, MD       . nitroglycerin (NITRO-BID) 2 % ointment 1 inch  1 inch Topical Q6H HOLD MN Ester Rink, MD       . ondansetron (ZOFRAN-ODT) disintegrating tablet 8 mg  8 mg Oral Q4H PRN Ester Rink, MD   8 mg at 01/26/16 2201    Or   . ondansetron (ZOFRAN) injection 4 mg  4 mg Intravenous Q4H PRN Ester Rink, MD   4 mg at 01/27/16 0849   . oxyCODONE-acetaminophen (PERCOCET) 5-325 MG per tablet 1 tablet  1 tablet Oral Q6H PRN Otho Ket, MD       . prochlorperazine (COMPAZINE) injection 10 mg  10 mg Intravenous Q4H PRN Ester Rink, MD       . sevelamer (RENVELA) tablet 800 mg  800 mg Oral TID MEALS Ester Rink, MD   800 mg at 01/27/16 1650   . vancomycin 50 mg/mL oral solution 500 mg  500 mg Oral 4 times per day Otho Ket, MD           Review of Systems:   Review of systems as noted in HPI. Full 12 point  ROS otherwise negative except:     Physical Exam:   Temp:  [98.1 F (36.7 C)-98.3 F (36.8 C)] 98.3 F (36.8 C)  Heart Rate:  [104-110] 105  Resp Rate:  [20] 20  BP: (150-186)/(60-103) 150/98  Body mass index is 29.04 kg/m.  No intake or output data in the 24 hours ending 01/27/16 1713     General Appearance:  alert and appropriate, well appearing    Neuro:   alert, oriented, normal speech, no focal findings    HEENT:  no scleral icterus, pupils round and reactive, OP clear    Neck:  supple, no significant adenopathy     Lungs:  clear to auscultation, no wheezes, rales or  rhonchi, symmetric   air entry     Cardiac:  normal rate, regular rhythm, normal S1, S2,    Abdomen:  soft, non-tender, non-distended, normal active bowel sounds,   no hepatosplenomegaly    Extremities:  no pedal edema    Skin:  no rash or other skin lesions     Labs:     Results     Procedure Component Value Units Date/Time    Basic Metabolic Panel [[id number]]  (Abnormal) Collected:  01/27/16 0543    Specimen:  Plasma Updated:  01/27/16 0648     Sodium 136 mMol/L      Potassium 5.4 (H) mMol/L      Chloride 105 mMol/L      CO2 19.5 (L) mMol/L      Calcium 7.5 (L) mg/dL      Glucose 74 mg/dL      Creatinine 2.44 (H) mg/dL      BUN 34 (H) mg/dL      Anion Gap 01.0 mMol/L      BUN/Creatinine Ratio 4.6 (L) Ratio      EGFR 9 (L) mL/min/1.41m2      Osmolality Calc 278 mOsm/kg     CBC with differential [[id number]]  (Abnormal) Collected:  01/27/16 0543    Specimen:  Blood from Blood Updated:  01/27/16 0639     WBC 5.9 K/cmm      RBC 2.86 (L) M/cmm      Hemoglobin 8.8 (L) gm/dL      Hematocrit 03.4 (L) %      MCV 94 fL      MCH 31 pg      MCHC 33 gm/dL      RDW 74.2 (H) %      PLT CT 136 K/cmm      MPV 7.7 fL      NEUTROPHIL % 80.6 (H) %      Lymphocytes 8.5 (L) %      Monocytes 7.8 %      Eosinophils % 2.6 %      Basophils % 0.6 %      Neutrophils Absolute 4.8 K/cmm      Lymphocytes Absolute 0.5 (L) K/cmm      Monocytes Absolute 0.5 K/cmm      Eosinophils Absolute 0.2 K/cmm      BASO Absolute 0.0 K/cmm             Radiology:     Radiology Results (24 Hour)     Procedure Component Value Units Date/Time    US ABDOMEN/PELVIS ART/VEIN VISCERAL DUPLEX DOPP COMP [[id number]] Resulted:  01/27/16 4332    Order Status:  Sent Updated:  01/27/16 1356        Imaging personally reviewed.    Signed by: Jefferson Fuel, MD  Cc:Pcp, Noneorunknown, MD Otho Ket, MD

## 2016-01-27 NOTE — UM Notes (Signed)
[facility] Utilization Management Review Sheet    NAME: [name]  MR#: [id number]     DOB: July 17, 1970    CSN#: [id number]    ROOM: 543/543-A AGE: 45 y.o.    ADMIT DATE AND TIME: 01/26/2016  3:56 AM      PATIENT CLASS: Inpatient 01/26/2016 @ 0449     ATTENDING PHYSICIAN: Jorge Ket, MD  PAYOR:Payor: MEDICARE / Plan: MEDICARE PART A AND B / Product Type: *No Product type* /       AUTH #:     DIAGNOSIS:   ESRD needing dialysis N18.6, Z99.2   01/26/2016 Not Applicable   Clostridium difficile infection B96.89   01/26/2016 Yes       HISTORY:   Past Medical History:   Diagnosis Date   . Asthma without status asthmaticus    . 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 Clostridium difficile diarrhea    . Drug-seeking behavior    . Hypertension    . Kidney failure      Patient presents to [facility] as a transfer with complaints of diarrhea and shortness of breath.      LABS: Hgb 9.9 Hct 30.0 RBC 3.20 BUN 58 Creatinine 11.37 Potassium 5.7 Calcium 7.9 EGFR 6 Alkaline Phosphatase 218 Albumin 2.6 Cdiff positive     CT Abdomen Pelvis dry: Volume overload with cardiomegaly, ascites, edema, anasarca.    Complex ascites.     XR Chest AP Portable: Left IJ double lumen dialysis catheter tip at superior cavoatrial junction.  Right subclavian vascular stent.  Cardiomegaly.  Mild pulmonary edema.  No focal consolidation or suspicious infiltrates.  Trace pleural effusions.    VITALS: T97.4 P106 R18 BP165/93 Sat 100%    ASSESSMENT AND PLAN:  1. Volume overload from missed hemodialysis in patient with ESRD on HD with anemia  Evidenced clinically and radiographically  Due to medical non-compliance; patient has not had hemodialysis since Monday, January 19, 2016  Received IV Lasix with improvement in his symptoms  Monitor H&H  Resume outpatient phosphate binders  Renal consult to facilitate inpatient hemodialysis  Patient counseled on need to be compliant with medical therapy and asserts understanding    2. Recurrent Clostridium difficile  diarrhea  Evident on Care Everywhere tab on EPIC from [facility]; tested positive in 02/2015, 09/2015 and on 01/25/2016  As per patient, his had more than 10 infections since last year  Repeat C. Difficile workup ordered  Will start patient on Fidaxomicin  Contact isolation  Patient likely secondary candidate for fecal transplant and will need to follow-up at the appropriate facility on discharge    3. Accelerated essential HTN  Due to medical non-compliance  Resume outpatient medications  Topical nitroglycerin for now  IV hydralazine as needed  Telemetry monitoring  Patient counseled on need to be compliant with medical therapy and asserts understanding    4. Systolic and diastolic CHF, chronic  Already received IV Lasix prior to transfer  Will continue with by mouth Lasix  Resume outpatient Coreg  Intake and output monitoring, daily weight  Telemetry monitoring    5. Mild intermittent asthma with active tobacco smoking  Stable without exacerbation on admission  Resume outpatient albuterol therapy  DuoNeb and oxygen as needed  Nicotine replacement therapy ordered  Tobacco cessation counseling done for 3-5 minutes    Plan of care discussed with patient and is in agreement as outlined, with verbalization of understanding and agreement    Disposition: Inpatient admission  Nephrology consulted; Dr [name]    Admit to Renal Pulmonary on telemetry vitals q 8 hrs O2 to maintain Sat 92% or greater Renal diet     MEDICATIONS: Rocaltrol 0.24mcg PO mwf Coreg 25mg  PO q 12 hrs Bentyl 20mg  PO 4 times daily GI cocktail 45ml PO x 1 Pepcid 20mg  PO daily Dificid 200mg  PO q 12 hrs Lasix 80mg  PO Daily Heparin 5000units q 12 hrs Apresoline 100mg  PO q 8 hrs Dilaudid 0.2 mg IV x 1 Protonix 40mg  PO q morning Renvela 800mg  PO 3 times daily with meals Apresoline 10mg  IV 4 times daily PRN x 1 Dilaudid 0.5mg  IV q 4 hrs PRN x 3 Zofran 8mg  PO q 4 hrs PRN x 1 Zofran 4mg  IV q 4 hrs PRN x 1       [name] L. Danella Penton RN  BSN  [facility]  Utilization Review  [phone]  Fax [phone]

## 2016-01-27 NOTE — Progress Notes (Signed)
Received a phone call from Brittney with [facility]. Per Brittney the patients case can not be arranged as a chair transfer, he will need to go through a new patient chair assignment process. Provided Brittney with documentation as requested.       Tramya Schoenfelder K. Pinner-Lashannon Bresnan, RN BSN  Case Production designer, theatre/television/film   [facility]  [phone]

## 2016-01-27 NOTE — Progress Notes (Signed)
Renal Progress Note    Follow up for:  ESRD    Subjective:     No complaints to me at this time  Tolerated dialysis well yesterday     Objective:     Vitals:  BP (!) 160/100   Pulse (!) 106   Temp 98.3 F (36.8 C) (Oral)   Resp 20   Ht 1.854 m (6\' 1" )   Wt 99.8 kg (220 lb 1.6 oz) Comment: Standing WT  SpO2 95%   BMI 29.04 kg/m     Wt Readings from Last 1 Encounters:   01/27/16 0445 99.8 kg (220 lb 1.6 oz)   01/26/16 0435 100.9 kg (222 lb 6.4 oz)   01/26/16 0358 100.9 kg (222 lb 6.4 oz)       No intake or output data in the 24 hours ending 01/27/16 1453    Physical Exam:  General appearance - alert, in no distress  Chest - clear to auscultation, good respiratory effort  Heart - normal rate and regular rhythm, no rub appreciated  Abdomen - soft,  nondistended, bowel sounds normal  Extremities - trace edema  Skin-warm and dry    Labs:  Labs reviewed.  Pertinent findings below.      Recent Labs  Lab 01/27/16  0543   WBC 5.9   Hemoglobin 8.8*   Hematocrit 26.9*   PLT CT 136             Invalid input(s): LEUKOCYTESUR     Recent Labs  Lab 01/27/16  0543 01/26/16  0000   Glucose 74 83   BUN 34* 58*   Creatinine 7.40* 11.37*   Sodium 136 140   Potassium 5.4* 5.7*   Chloride 105 107   CO2 19.5* 21.4   AST (SGOT)  --  15   ALT  --  8   Calcium 7.5* 7.9*   Albumin  --  2.6*              Assessment:     1. 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 On chronic hemodialysis, sporadically. Most recently on a Monday Wednesday Friday schedule by his report.  2. Nonadherence to prescribed dialysis treatment regimen  3. Hyperkalemia-associated with missed dialysis treatment. Improved with hemodialysis  4. Metabolic acidosis-associated with missed dialysis treatment  5. Volume overload-associated with missed dialysis treatment  6. Anemia of ESRD  7. Secondary hyperparathyroidism  8. Hypertension-suspected medication nonadherence as well as volume overload  9. C. difficile diarrhea    Plan:      Continue dialysis on usual MWF  schedule, next due tomorrow   Anticipate biochemical derangements should improve with additional hemodialysis (he had missed a week of dialysis)   Maintain renal diet and fluid restrictions   Continue EPO with hemodialysis   Continue current antihypertensive medications, and assess response to additional ultrafiltration   Continue oral calcitriol (had been on this rather than IV activated vitamin D by review of previous outpatient standing orders)   Await outpatient dialysis chair assignment. Would favor providing hemodialysis in the hospital tomorrow, and then if otherwise medically ready and dialysis chair is established, he may be discharged.    Orders and Medications reviewed in Epic.  Case discussed with RN.  Case discussed with: Dr. Rickey Barbara, MD  2:53 PM  01/27/2016

## 2016-01-27 NOTE — Progress Notes (Signed)
Date Time: 01/27/16 3:30 PM  Patient Name: [name], [name]       CSN: [id number]  Attending Physician: Otho Ket, Riaan Toledo  Primary Care Provider: Christa See, Myosha Cuadras    Interim summary [facility] course)     45 y.o. M with PMH of , ESRD  on hemodialysis over the past 7 years,  NON COMOPLIANT TO TRAETMENT,  anemia,   chronic systolic and diastolic congestive heart failure with LVEF of 35-40% and grade 2 diastolic dysfunction by 2-D echo in August 2017, HTN, mild intermittent asthma, tobacco dependence via cigarette smoking,WAS ADMITTED WITH     SOB AND CDAD .    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  Nephrology consulted ; started on HD   Pt has perma cath .  ID consulted         SUBJECTIVE:     CC:no cp   No sob   + abd pain   Keep asking for more frequent dosing of pain meds   Pt says he doesn't like percocet     PHYSICAL EXAM:   Temp:  [98.1 F (36.7 C)-98.3 F (36.8 C)] 98.3 F (36.8 C)  Heart Rate:  [104-110] 105  Resp Rate:  [20] 20  BP: (150-186)/(60-103) 150/98  Body mass index is 29.04 kg/m.  No intake or output data in the 24 hours ending 01/27/16 1530  Weight Monitoring 01/05/2016 01/06/2016 01/25/2016 01/25/2016 01/26/2016 01/26/2016 01/27/2016   Height - 185.4 cm 185.4 cm 185.4 cm 185.4 cm 185.4 cm -   Height Method - Stated Stated Stated Stated Stated -   Weight 107.911 kg 105.688 kg 105.688 kg 105.688 kg 100.88 kg 100.88 kg 99.837 kg   Weight Method Standing Scale Stated Stated Stated Standing Scale Standing Scale Actual   BMI (calculated) - 30.8 kg/m2 30.8 kg/m2 30.8 kg/m2 29.4 kg/m2 29.4 kg/m2 -         Exam:   General: moderately built, no acute distress   Psychiatry: Patient is awake, alert and oriented x 3, mood is appropriate   Chest: CTA bilaterally. No crackles or wheezing. No accessory muscle use   CVS: S1, S2 normal, RRR, no thrill   Abdomen: soft and mildy tender r with good bowel  sounds.  Musculoskeletal: No pitting edema, no cyanosis/clubbing. Expected ROM all 4 extremities. No joint swelling     MEDS: (SCHEDULED/INFUSIONS/PRN)     Current Facility-Administered Medications   Medication Dose Route Frequency   . albuterol-ipratropium  2 puff Inhalation Q6H   . amLODIPine  10 mg Oral Daily   . calcitRIOL  0.5 mcg Oral Mon-Wed-Fri   . carvedilol  25 mg Oral Q12H SCH   . cloNIDine  0.1 mg Oral Q12H SCH   . dicyclomine  20 mg Oral QID   . famotidine  20 mg Oral Daily   . fidaxomicin  200 mg Oral Q12H SCH   . furosemide  80 mg Oral Daily   . heparin (porcine)  5,000 Units Subcutaneous Q12H [facility]   . hydrALAZINE  100 mg Oral Q8H SCH   . nitroglycerin  1 inch Topical Q6H HOLD MN   . sevelamer  800 mg Oral TID MEALS     Current Facility-Administered Medications   Medication Dose Route Frequency Last Rate     Current Facility-Administered Medications   Medication Dose Route   . acetaminophen  650 mg Oral    Or   .  acetaminophen  650 mg per NG tube    Or   . acetaminophen  650 mg Rectal   . albuterol  2.5 mg Nebulization   . heparin (porcine)  1,000-3,000 Units Intracatheter   . hydrALAZINE  10 mg Intravenous   . HYDROmorphone  0.5 mg Intravenous   . naloxone  0.4 mg Intravenous   . ondansetron  8 mg Oral    Or   . ondansetron  4 mg Intravenous   . oxyCODONE-acetaminophen  1 tablet Oral   . prochlorperazine  10 mg Intravenous         LABS:     Recent Labs  Lab 01/27/16  0543 01/26/16  0000   WBC 5.9 6.1   RBC 2.86* 3.20*   Hemoglobin 8.8* 9.9*   Hematocrit 26.9* 30.0*   MCV 94 94   PLT CT 136 145         Recent Labs  Lab 01/27/16  0543 01/26/16  0000   Sodium 136 140   Potassium 5.4* 5.7*   Chloride 105 107   CO2 19.5* 21.4   BUN 34* 58*   Creatinine 7.40* 11.37*   Glucose 74 83   EGFR 9* 6*   Calcium 7.5* 7.9*               Recent Labs  Lab 01/26/16  0000   ALT 8   AST (SGOT) 15   Bilirubin, Total 0.6   Albumin 2.6*   Alkaline Phosphatase 218*                    No results found for:  HGBA1CPERCNT      IMAGING STUDIES:   Ct Abdomen Pelvis Dry  (stone)    Result Date: 01/26/2016  Volume overload with cardiomegaly, ascites, edema, anasarca. Complex ascites. ReadingStation:[email or web address]    Nm Lung Ventilation Perfusion Aerosol    Result Date: 01/08/2016   Low probability for pulmonary embolism. Collene Schlichter, Laycee Fitzsimmons 01/08/2016 9:32 AM     Xr Chest Ap Portable    Result Date: 01/26/2016  Left IJ double lumen dialysis catheter tip at superior cavoatrial junction. Right subclavian vascular stent. Cardiomegaly. Mild pulmonary edema. No focal consolidation or suspicious infiltrates. Trace pleural effusions. 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 No findings to suggest acute DVT. Dimitrios  Papadouris, Talal Fritchman 01/01/2016 6:05 PM     US Venous Duplex Doppler Leg Bilateral    Result Date: 01/07/2016   No evidence for DVT. Charlott Rakes, Nami Strawder 01/07/2016 2:43 PM     US Abdomen Limited Single Organ    Result Date: 01/05/2016   Small to moderate volume of abdominal and pelvic ascites. Findings appear similar to that seen on 11/10/2015 CT, accounting for differences in technique Laurena Slimmer, Tamir Wallman 01/05/2016 8:30 AM       MICROBIOLOGY AND/OR TELEMETRY:       Stool + c diff     ASSESSMENT AND PLAN:     SOB sec to   Volume overload from missed hemodialysis in patient with ESRD on HD   Due to medical non-compliance;  Nephrology following   Next HD  at am tomorrow .     Recurrent Clostridium difficile diarrhea  D/q Dr Katrinka Blazing   Started on po vanco   Pt will need tapering vanco on [place]       Accelerated essential HTN  Due to medical non-compliance  Cont BB   CCB   Increased  clonidine dose from 0.1 12 hourly to 0.2 mg 12 hourly .  BP is better  controlled today still uncontrolled .    Systolic and diastolic CHF, chronic    Intake and output monitoring, daily weight  Telemetry monitoring  cont current meds   Cont HD as scheduled        Mild intermittent asthma with active tobacco smoking  Stable without exacerbation   Cont current meds         DVT Prophylaxis: Heparin    CODE Status : FULL CODE    Disposition: cont current mx   F/u result of abd vascular doppler study   To r/o mesenteric venous occlusion /art stenosis   As pt has mesenteric edema .    Plan discussed with patient, nursing staff.    Signed by: Otho Ket, Rashida Ladouceur  [address]  [place]  Office Ph: [phone]    Please contact at phone number [phone] if any questions.          Note: This chart was generated by the Epic EMR system/speech recognition and may contain inherent errors or omissions not intended by the user. Grammatical errors, random word insertions, deletions, pronoun errors and incomplete sentences are occasional consequences of this technology due to software limitations. Not all errors are caught or corrected. If there are questions or concerns about the content of this note or information contained within the body of this dictation they should be addressed directly with the author for clarification

## 2016-01-27 NOTE — Plan of Care (Signed)
Problem: Renal Instability  Goal: Fluid and electrolyte balance are achieved/maintained  Outcome: Progressing   01/27/16 1756   Goal/Interventions addressed this shift   Fluid and electrolyte balance are achieved/maintained  Monitor intake and output every shift;Monitor/assess lab values and report abnormal values;Provide adequate hydration;Monitor daily weight;Assess for confusion/personality changes;Assess and reassess fluid and electrolyte status;Observe for seizure activity and initiate seizure precautions if indicated;Monitor for muscle weakness;Observe for cardiac arrhythmias;Follow fluid restrictions/IV/PO parameters       Problem: Pain interferes with ability to perform ADL  Goal: Pain at adequate level as identified by patient  Outcome: Progressing   01/27/16 1756   Goal/Interventions addressed this shift   Pain at adequate level as identified by patient Identify patient comfort function goal;Assess for risk of opioid induced respiratory depression, including snoring/sleep apnea. Alert healthcare team of risk factors identified.;Assess pain on admission, during daily assessment and/or before any "as needed" intervention(s);Reassess pain within 30-60 minutes of any procedure/intervention, per Pain Assessment, Intervention, Reassessment (AIR) Cycle;Evaluate if patient comfort function goal is met;Evaluate patient's satisfaction with pain management progress;Offer non-pharmacological pain management interventions;Consult/collaborate with Pain Service;Consult/collaborate with Physical Therapy, Occupational Therapy, and/or Speech Therapy;Include patient/patient care companion in decisions related to pain management as needed

## 2016-01-27 NOTE — Plan of Care (Signed)
Problem: Pain interferes with ability to perform ADL  Goal: Pain at adequate level as identified by patient  Outcome: Progressing   01/27/16 1756   Goal/Interventions addressed this shift   Pain at adequate level as identified by patient Identify patient comfort function goal;Assess for risk of opioid induced respiratory depression, including snoring/sleep apnea. Alert healthcare team of risk factors identified.;Assess pain on admission, during daily assessment and/or before any "as needed" intervention(s);Reassess pain within 30-60 minutes of any procedure/intervention, per Pain Assessment, Intervention, Reassessment (AIR) Cycle;Evaluate if patient comfort function goal is met;Evaluate patient's satisfaction with pain management progress;Offer non-pharmacological pain management interventions;Consult/collaborate with Pain Service;Consult/collaborate with Physical Therapy, Occupational Therapy, and/or Speech Therapy;Include patient/patient care companion in decisions related to pain management as needed       Patient resting quietly in bed. Alert and oriented x4. PRN pain medication given per request. Denies further needs at this time. Will continue to monitor.

## 2016-01-28 LAB — CBC AND DIFFERENTIAL
Basophils %: 0 % (ref 0.0–3.0)
Basophils Absolute: 0 10*3/uL (ref 0.0–0.3)
Eosinophils %: 4.5 % (ref 0.0–7.0)
Eosinophils Absolute: 0.2 10*3/uL (ref 0.0–0.8)
Hematocrit: 25.8 % — ABNORMAL LOW (ref 39.0–52.5)
Hemoglobin: 8.6 gm/dL — ABNORMAL LOW (ref 13.0–17.5)
Lymphocytes Absolute: 0.5 10*3/uL — ABNORMAL LOW (ref 0.6–5.1)
Lymphocytes: 11.6 % — ABNORMAL LOW (ref 15.0–46.0)
MCH: 31 pg (ref 28–35)
MCHC: 33 gm/dL (ref 32–36)
MCV: 94 fL (ref 80–100)
MPV: 7.6 fL (ref 6.0–10.0)
Monocytes Absolute: 0.5 10*3/uL (ref 0.1–1.7)
Monocytes: 10.3 % (ref 3.0–15.0)
Neutrophils %: 73.6 % (ref 42.0–78.0)
Neutrophils Absolute: 3.5 10*3/uL (ref 1.7–8.6)
PLT CT: 133 10*3/uL (ref 130–440)
RBC: 2.74 10*6/uL — ABNORMAL LOW (ref 4.00–5.70)
RDW: 15.2 % — ABNORMAL HIGH (ref 11.0–14.0)
WBC: 4.7 10*3/uL (ref 4.0–11.0)

## 2016-01-28 LAB — BASIC METABOLIC PANEL
Anion Gap: 15.2 mMol/L (ref 7.0–18.0)
BUN / Creatinine Ratio: 5 Ratio — ABNORMAL LOW (ref 10.0–30.0)
BUN: 45 mg/dL — ABNORMAL HIGH (ref 7–22)
CO2: 22 mMol/L (ref 20.0–30.0)
Calcium: 7.2 mg/dL — ABNORMAL LOW (ref 8.5–10.5)
Chloride: 104 mMol/L (ref 98–110)
Creatinine: 8.94 mg/dL — ABNORMAL HIGH (ref 0.80–1.30)
EGFR: 7 mL/min/{1.73_m2} — ABNORMAL LOW (ref 60–150)
Glucose: 88 mg/dL (ref 70–99)
Osmolality Calc: 283 mOsm/kg (ref 275–300)
Potassium: 5.2 mMol/L (ref 3.5–5.3)
Sodium: 136 mMol/L (ref 136–147)

## 2016-01-28 MED ORDER — HEPARIN SODIUM (PORCINE) 1000 UNIT/ML IJ SOLN
INTRAMUSCULAR | Status: AC
Start: 2016-01-28 — End: ?
  Filled 2016-01-28: qty 10

## 2016-01-28 NOTE — Progress Notes (Signed)
Renal Progress Note    Follow up for:  ESRD    Subjective:     Patient seen while on hemodialysis  Complains of nausea  Has been refusing medications this morning per nursing    Objective:     Vitals:  BP (!) 179/97   Pulse (!) 101   Temp (P) 98.6 F (37 C)   Resp (P) 20   Ht 1.854 m (6\' 1" )   Wt 96.8 kg (213 lb 4.8 oz)   SpO2 100%   BMI 28.14 kg/m     Wt Readings from Last 1 Encounters:   01/28/16 0542 96.8 kg (213 lb 4.8 oz)   01/27/16 0445 99.8 kg (220 lb 1.6 oz)   01/26/16 0435 100.9 kg (222 lb 6.4 oz)   01/26/16 0358 100.9 kg (222 lb 6.4 oz)         Intake/Output Summary (Last 24 hours) at 01/28/16 1137  Last data filed at 01/27/16 2154   Gross per 24 hour   Intake              240 ml   Output                0 ml   Net              240 ml       Physical Exam:  General appearance - alert, in no distress  Chest - clear to auscultation, good respiratory effort  Heart - normal rate and regular rhythm, no rub appreciated  Abdomen - soft,  nondistended, bowel sounds normal  Extremities - trace edema  Skin-warm and dry    Dialysis flow sheet reviewed, BFR 350 ml/min    Labs:  Labs reviewed.  Pertinent findings below.      Recent Labs  Lab 01/27/16  0543   WBC 5.9   Hemoglobin 8.8*   Hematocrit 26.9*   PLT CT 136             Invalid input(s): LEUKOCYTESUR     Recent Labs  Lab 01/27/16  0543 01/26/16  0000   Glucose 74 83   BUN 34* 58*   Creatinine 7.40* 11.37*   Sodium 136 140   Potassium 5.4* 5.7*   Chloride 105 107   CO2 19.5* 21.4   AST (SGOT)  --  15   ALT  --  8   Calcium 7.5* 7.9*   Albumin  --  2.6*              Assessment:     1. 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 On chronic hemodialysis, sporadically. Most recently on a Monday Wednesday Friday schedule by his report.  2. Nonadherence to prescribed dialysis treatment regimen  3. Hyperkalemia-associated with missed dialysis treatment. Improved with hemodialysis  4. Metabolic acidosis-associated with missed dialysis treatment  5. Volume  overload-associated with missed dialysis treatment  6. Anemia of ESRD  7. Secondary hyperparathyroidism  8. Hypertension-suspected medication nonadherence as well as volume overload  9. C. difficile diarrhea    Plan:      Continue dialysis on usual MWF schedule   Anticipate biochemical derangements should improve with hemodialysis today   Maintain renal diet and fluid restrictions   Continue EPO with hemodialysis   Continue current antihypertensive medications, and encourage adherence to prescribed regimen   Assess blood pressure response to additional ultrafiltration   Continue oral calcitriol (had been on this rather than IV activated vitamin D by review of previous outpatient standing  orders)   Await outpatient dialysis chair assignment   He was initially demanding to discontinue dialysis early today, but after speaking with him and advising of adverse consequences of inadequate dialysis (including death), he agrees to remain on treatment     Orders and Medications reviewed in Epic.  Case discussed with RN.    Versie Starks, MD  11:37 AM  01/28/2016

## 2016-01-28 NOTE — Progress Notes (Signed)
Date Time: 01/28/16 7:32 PM  Patient Name: [name], [name]       CSN: [id number]  Attending Physician: Jenness Corner, MD  Primary Care Provider: Christa See, MD    Interim summary [facility] course)     45 y.o. M with PMH of , ESRD  on hemodialysis over the past 7 years,  NON COMOPLIANT TO TRAETMENT,  anemia,   chronic systolic and diastolic congestive heart failure with LVEF of 35-40% and grade 2 diastolic dysfunction by 2-D echo in August 2017, HTN, mild intermittent asthma, tobacco dependence via cigarette smoking,WAS ADMITTED WITH     SOB AND CDAD .    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  Nephrology consulted ; started on HD   Pt has perma cath .  ID consulted         SUBJECTIVE:     CC: complaints of abdominal distention  Continued to refuse his medications and states that his blood pressure usuallyly runs at 180s  Thinks he needs another paracentesis, last one was last Thursday and he currently had been having it every week lately    PHYSICAL EXAM:   Temp:  [98.2 F (36.8 C)-98.6 F (37 C)] 98.2 F (36.8 C)  Heart Rate:  [100-115] 107  Resp Rate:  [18-20] 18  BP: (148-224)/(72-120) 174/76  Body mass index is 28.14 kg/m.    Intake/Output Summary (Last 24 hours) at 01/28/16 1932  Last data filed at 01/28/16 1350   Gross per 24 hour   Intake              240 ml   Output             2000 ml   Net            -1760 ml     Weight Monitoring 01/06/2016 01/25/2016 01/25/2016 01/26/2016 01/26/2016 01/27/2016 01/28/2016   Height 185.4 cm 185.4 cm 185.4 cm 185.4 cm 185.4 cm - -   Height Method Stated Stated Stated Stated Stated - -   Weight 105.688 kg 105.688 kg 105.688 kg 100.88 kg 100.88 kg 99.837 kg 96.752 kg   Weight Method Stated Stated Stated Standing Scale Standing Scale Actual Standing Scale   BMI (calculated) 30.8 kg/m2 30.8 kg/m2 30.8 kg/m2 29.4 kg/m2 29.4 kg/m2 - -         Exam:   General: moderately  built, no acute distress   Psychiatry: Patient is awake, alert and oriented x 3, mood is appropriate   Chest: CTA bilaterally. No crackles or wheezing. No accessory muscle use   CVS: S1, S2 normal, RRR, no thrill   Abdomen: soft and mildy tender r with good bowel sounds.  Musculoskeletal: No pitting edema, no cyanosis/clubbing. Expected ROM all 4 extremities. No joint swelling     MEDS: (SCHEDULED/INFUSIONS/PRN)     Current Facility-Administered Medications   Medication Dose Route Frequency   . albuterol-ipratropium  2 puff Inhalation Q6H   . amLODIPine  10 mg Oral Daily   . calcitRIOL  0.5 mcg Oral Mon-Wed-Fri   . carvedilol  25 mg Oral Q12H SCH   . cloNIDine  0.2 mg Oral Q12H   . dicyclomine  20 mg Oral QID   . famotidine  20 mg Oral Daily   . furosemide  80 mg Oral Daily   . heparin (porcine)  5,000 Units Subcutaneous Q12H [facility]   . hydrALAZINE  100  mg Oral Q8H SCH   . nitroglycerin  1 inch Topical Q6H HOLD MN   . sevelamer  800 mg Oral TID MEALS   . vancomycin  500 mg Oral 4 times per day     Current Facility-Administered Medications   Medication Dose Route Frequency Last Rate     Current Facility-Administered Medications   Medication Dose Route   . acetaminophen  650 mg Oral    Or   . acetaminophen  650 mg per NG tube    Or   . acetaminophen  650 mg Rectal   . albuterol  2.5 mg Nebulization   . heparin (porcine)  1,000-3,000 Units Intracatheter   . hydrALAZINE  10 mg Intravenous   . HYDROmorphone  0.5 mg Intravenous   . naloxone  0.4 mg Intravenous   . ondansetron  8 mg Oral    Or   . ondansetron  4 mg Intravenous   . oxyCODONE-acetaminophen  1 tablet Oral   . prochlorperazine  10 mg Intravenous         LABS:       Recent Labs  Lab 01/28/16  1133 01/27/16  0543   WBC 4.7 5.9   RBC 2.74* 2.86*   Hemoglobin 8.6* 8.8*   Hematocrit 25.8* 26.9*   MCV 94 94   PLT CT 133 136         Recent Labs  Lab 01/28/16  1133 01/27/16  0543   Sodium 136 136   Potassium 5.2 5.4*   Chloride 104 105   CO2 22.0 19.5*   BUN 45* 34*      Creatinine 8.94* 7.40*   Glucose 88 74   EGFR 7* 9*   Calcium 7.2* 7.5*               Recent Labs  Lab 01/26/16  0000   ALT 8   AST (SGOT) 15   Bilirubin, Total 0.6   Albumin 2.6*   Alkaline Phosphatase 218*                    No results found for: [id number]      IMAGING STUDIES:   Ct Abdomen Pelvis Dry  (stone)    Result Date: 01/26/2016  Volume overload with cardiomegaly, ascites, edema, anasarca. Complex ascites. ReadingStation:[email or web address]    Nm Lung Ventilation Perfusion Aerosol    Result Date: 01/08/2016   Low probability for pulmonary embolism. Collene Schlichter, MD 01/08/2016 9:32 AM     Xr Chest Ap Portable    Result Date: 01/26/2016  Left IJ double lumen dialysis catheter tip at superior cavoatrial junction. Right subclavian vascular stent. Cardiomegaly. Mild pulmonary edema. No focal consolidation or suspicious infiltrates. Trace pleural effusions. 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 No findings to suggest acute DVT. Dimitrios  Papadouris, MD 01/01/2016 6:05 PM     US Venous Duplex Doppler Leg Bilateral    Result Date: 01/07/2016   No evidence for DVT. Charlott Rakes, MD 01/07/2016 2:43 PM     US Abdomen Limited Single Organ    Result Date: 01/05/2016   Small to moderate volume of abdominal  and pelvic ascites. Findings appear similar to that seen on 11/10/2015 CT, accounting for differences in technique Laurena Slimmer, MD 01/05/2016 8:30 AM       MICROBIOLOGY AND/OR TELEMETRY:       Stool + c diff     ASSESSMENT AND PLAN:     SOB sec to   Volume overload from missed hemodialysis in patient with ESRD on HD   Due to medical non-compliance;  Nephrology following   Next HD at am tomorrow .     Recurrent Clostridium  difficile diarrhea  D/q Dr Katrinka Blazing   Started on po vanco   Pt will need tapering vanco on [place]       Accelerated essential HTN  Due to medical non-compliance  Cont BB   CCB   Increased  clonidine dose from 0.1 12 hourly to 0.2 mg 12 hourly .  BP is better controlled today still uncontrolled .    Systolic and diastolic CHF, chronic    Intake and output monitoring, daily weight  Telemetry monitoring  cont current meds   Cont HD as scheduled        Mild intermittent asthma with active tobacco smoking  Stable without exacerbation   Cont current meds     Recurrent ascites, US done was doppler   Will obtain abdominal US limited ascites         DVT Prophylaxis: Heparin    CODE Status : FULL CODE    Disposition: patient has multiple medical problems but had been noncompliant even during this hospitalization, unclear how much more we could offer other than HD at this time, despite explanation and importance of his meds    Plan discussed with patient, nursing staff.    Signed by: Jenness Corner, MD  [address]  [place]  Office Ph: [phone]    Please contact at phone number [phone] if any questions.          Note: This chart was generated by the Epic EMR system/speech recognition and may contain inherent errors or omissions not intended by the user. Grammatical errors, random word insertions, deletions, pronoun errors and incomplete sentences are occasional consequences of this technology due to software limitations. Not all errors are caught or corrected. If there are questions or concerns about the content of this note or information contained within the body of this dictation they should be addressed directly with the author for clarification

## 2016-01-28 NOTE — Plan of Care (Signed)
Problem: Renal Instability  Goal: Fluid and electrolyte balance are achieved/maintained  Outcome: Progressing  Pt on 2L O2 which pt states he wears at home, also up around room without O2. Painful this AM, did not want to go to dialysis, eventually decided to go. Pt refused all BP meds today before dialysis. BP remained high all morning. Gave BP meds upon pt's return to the floor from dialysis. Pt up in room independently. C/o 10/10 abdominal pain and has been requesting IV Zofran and IV Dilaudid soon after it is due.           01/27/16 1756   Goal/Interventions addressed this shift   Fluid and electrolyte balance are achieved/maintained  Monitor intake and output every shift;Monitor/assess lab values and report abnormal values;Provide adequate hydration;Monitor daily weight;Assess for confusion/personality changes;Assess and reassess fluid and electrolyte status;Observe for seizure activity and initiate seizure precautions if indicated;Monitor for muscle weakness;Observe for cardiac arrhythmias;Follow fluid restrictions/IV/PO parameters       Problem: Patient Receiving Advanced Renal Therapies  Goal: Therapy access site remains intact  Outcome: Progressing  Permacath site clean, dressing is clean, dry, and intact. Will continue to monitor.        01/27/16 0343   Goal/Interventions addressed this shift   Therapy access site remains intact Assess therapy access site       Problem: Side Effects from Pain Analgesia  Goal: Patient will experience minimal side effects of analgesic therapy  Outcome: Progressing   01/28/16 1510   Goal/Interventions addressed this shift   Patient will experience minimal side effects of analgesic therapy Monitor/assess patient's respiratory status (RR depth, effort, breath sounds);Assess for changes in cognitive function;Prevent/manage side effects per LIP orders (i.e. nausea, vomiting, pruritus, constipation, urinary retention, etc.);Evaluate for opioid-induced sedation with appropriate  assessment tool (i.e. POSS)       Problem: Moderate/High Fall Risk Score >5  Goal: Patient will remain free of falls  Outcome: Progressing       01/28/16 1111   OTHER   Moderate Risk (6-13) MOD-([facility] Only) Yellow "Fall Risk" signage;MOD-Initiate Yellow "Fall Risk" magnet communication tool;MOD-([facility] Only) Yellow slippers;MOD-([facility] Only) Apply yellow "Fall Risk" arm band;MOD-Use of chair-pad alarm when appropriate

## 2016-01-28 NOTE — Progress Notes (Signed)
F/u phone call placed to [facility] Intake with [facility]- [phone].  Per Philippa Chester the [facility] declined patient a chair- d/t the patients home address being 1 1/2 hours away from their facility and there are other facilities near the patients home address.  Notified Brittney that the patient requested that facility b/c it is close to his work and they offer HD chairs at 4 30 pm , so he could go for HD after work.Lowanda Foster requested the patients work address and she would contact them again to notify them and ask them to reconsider were the patients choice.  Patient notified of the above and provided a work address- [address] [place] [address]. Patient also stated that he would travel to any HD center in the [place] area if they offered nocturnal HD.  Contacted Brittney with address and patients requests- VMM left.  Updated the renal MD of the above.     Timica Marcom K. Pinner-Samiyyah Moffa, RN BSN  Case Production designer, theatre/television/film   [facility]  [phone]

## 2016-01-28 NOTE — Progress Notes (Signed)
Patient refusing medications on and off throughout the shift. Educated on risks and importance of compliance with plan of care. Will continue to monitor.

## 2016-01-28 NOTE — Plan of Care (Addendum)
Problem: Renal Instability  Goal: Fluid and electrolyte balance are achieved/maintained  Outcome: Progressing   01/28/16 2237   Goal/Interventions addressed this shift   Fluid and electrolyte balance are achieved/maintained  Monitor intake and output every shift;Monitor/assess lab values and report abnormal values;Provide adequate hydration;Monitor daily weight;Assess for confusion/personality changes;Assess and reassess fluid and electrolyte status;Follow fluid restrictions/IV/PO parameters       Comments: Patient requested IV dilaudid early, Per Dr. Minus Liberty, OK to given this one dose early. Patient notes some relief in back pain after dose given.   Blood pressure elevated this evening. Patient refusing PO clonidine and Iv hydralazine until all other BP meds have been given tonight. Plan for U/S abd RUQ in Am as patient will need to be NPO. Will continue to monitor throughout this shift.     Shift summary: patient in U/S at this time. IV dilaudid given per pt request prior to transfer and test. IV dilaudid given as requested during the shift. Patient would like to speak with physician about pain control. Note left for rounding MD.   Telemetry SR/ST with HR 90s-100s.   Will continue to monitor until the end of this shift.

## 2016-01-28 NOTE — Progress Notes (Signed)
01/28/16 1650   Case Management Quick Doc   Case Management Assessment Status Assessment Complete   CM Comments RNCM: 10/11: working to arrange an outpatient HD chair for patient with [facility]. See detailed RNCM notes.    Expected Discharge Date 01/29/16       [name] K. Pinner-Ronneisha Jett, RN BSN  Case Production designer, theatre/television/film   [facility]  [phone]

## 2016-01-28 NOTE — Progress Notes (Signed)
Pt refusing all BP meds until after dialysis. Educated pt on importance of maintaining a lower BP. Still refused. Pt went to dialysis, RN aware of high BP and pt refusing meds. After dialysis pt BP was 224/117. Gave PO Norvasc and PO hydralazine. 45 mins later BP was 182/84. Discussed PRN IV Hydralazine w/ pt. He refused states " that is a good BP for me, it always runs high." At 1530 I rechecked BP, it was 176/72. Again offered pt PRN IV Hydralazine. Pt agreed. Recheck BP was 174/76 at 1840.

## 2016-01-28 NOTE — Progress Notes (Signed)
Dialysis Treatment Note    Patient:  [name] MRN#:  [id number]  Unit/Room/Bed:  160/109-N   Isolation: Contact Special       Allergies: Allergies   Allergen Reactions   . Lisinopril Anaphylaxis   . Morphine Anaphylaxis   . Toradol [Ketorolac Tromethamine] Anaphylaxis     Code Status: Full Code    Problem List:   Patient Active Problem List    Diagnosis Date Noted   . ESRD needing dialysis 01/26/2016   . Clostridium difficile infection 01/26/2016   . Cardiomyopathy    . Cardiac LV ejection fraction 21-40% 01/07/2016   . Chest pain, unspecified type 01/06/2016   . Chest pain in adult 12/30/2015   . Acute on chronic congestive heart failure 12/30/2015   . Drug-seeking behavior 12/18/2015   . HAP (hospital-acquired pneumonia) 12/17/2015   . Diarrhea 12/17/2015   . Hospital-acquired pneumonia    . Nausea & vomiting 11/09/2015   . Decreased appetite 11/09/2015   . Ascites 11/09/2015   . Diarrhea of infectious origin 11/09/2015   . Acute pain due to trauma 11/17/2013   . Pulmonary contusion, initial encounter 11/17/2013   . 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 Hypertension 11/17/2013       Dialysis Order:  Active Orders   Dialysis    Hemodialysis inpatient     Frequency: Once     Start Date/Time: 01/28/16 0000     Number of Occurrences:  1 Occurrences     Order Questions:     . Date of Dialysis 01/28/2016     . K+ (initial) 2 mEq     . KPP (Per Protocol) Call MD     . Ca++ 2.5 mEq     . Bicarb 35 mEq     . Na+ 138 mEq     . Dialyzer ReviClear     . Dialysate Temperature (C) 37     . BFR-As tolerated to a maximum of: 400 mL/min     . DFR 600 mL/min     . Duration of Treatment 3.5 Hours     . Fluid Removal (L) and Dry Weight (Kg) Dry Weight (please specify) (Comment: 97 kg)     . Limit Ultrafiltration Rate Yes     . Specify mL/hr/kg 13     . Access Type-Location Tunneled-L        Dialysis Treatment Type:    Time out/Safety Check: Time Out/Safety Check Completed: Yes (01/28/16 2355)  [place]  Hemodialysis Consent: Consent for HD signed for this hospitalization: Yes (01/28/16 7322)  Blood Consent (if applicable): Blood Consent Verified: Not Applicable (01/28/16 0254)    Dialysis Access:  Permacath/Temporary Catheter Permacath Left (Active)   Line necessity reviewed? Dialysis 01/28/2016 11:09 AM   Catheter Lumen Volume Venous 1.9 mL 01/28/2016  1:50 PM   Catheter Lumen Volume Arterial 1.8 mL 01/28/2016  1:50 PM   Dressing Status and Intervention Dressing Intact;Clean & Dry 01/28/2016  1:50 PM   Dressing Type Antimicrobial impregnated 01/28/2016 11:09 AM   Dressing Status Clean;Dry;Intact 01/28/2016 11:09 AM           General Assessments:          01/28/16 1011   Vitals   Temp 98.6 F (37 C)   Heart Rate (!) 101   Resp Rate 20   BP (!) 182/110   O2 Device Nasal cannula   Assessment   Respiratory Pattern Regular;Easy;Unlabored   Bilateral Breath Sounds Diminished  General Skin Color Appropriate for ethnicity   GI Symptoms None        01/28/16 1350   Vitals   Temp 98.3 F (36.8 C)   Heart Rate (!) 115   Resp Rate 18   BP (!) 224/117   O2 Device Nasal cannula   Assessment   Respiratory Pattern Easy;Unlabored   Bilateral Breath Sounds Diminished   General Skin Color Appropriate for ethnicity   GI Symptoms None           Pain Assessment: Pain Assessment  Charting Type: Assessment (01/28/16 1011)  GWN Pain Level (Read Only): 10 (01/28/16 0832)  Pain Scale Used: Numeric Scale (0-10) (01/28/16 1011)    Labs Values:    HBsAg Result:HBsAg (Antigen) Result: Negative (01/28/16 0838)  HBsAg Date Drawn: HBsAg Date Drawn: 01/26/16 (01/28/16 0109)  HBsAg Next Due Date:     Lab Results   Component Value Date    BUN 45 (H) 01/28/2016    NA 136 01/28/2016    K 5.2 01/28/2016    CREAT 8.94 (H) 01/28/2016    CO2 22.0 01/28/2016    CA 7.2 (L) 01/28/2016    PHOS 5.8 (H) 01/06/2016    GLU 88 01/28/2016    ALB 2.6 (L) 01/26/2016    HGB 8.6 (L) 01/28/2016    HCT 25.8 (L) 01/28/2016    WBC 4.7 01/28/2016    PLT 133 01/28/2016     PTT 40 (H) 11/10/2015    PT 17.4 (H) 11/12/2015    INR 1.4 (H) 11/12/2015         Current Diet Order      Diet renal 80 GM Protein     RO/Hemodialysis Machine Safety Checks - Before Each Treatment:  RO/Hemodialysis Dance movement psychotherapist Number: 28 (01/28/16 3235)  RO #: [id number] (01/28/16 5732)  Water Hardness: 0 (01/28/16 0838)  pH: 7.2 (01/28/16 0838)  Pressure Test Verified: Yes (01/28/16 0838)  Alarms Verified: Passed (01/28/16 0838)  Machine Temperature: 98.1 F (36.7 C) (01/28/16 0838)  Alarms Verified: Yes (01/28/16 0838)  Na+ mEq (Machine): 138 mEq (01/28/16 0838)  Bicarb mEq (Machine): 35 mEq (01/28/16 0838)  Hemodialysis Conductivity (Machine): 13.6 (01/28/16 0838)  Hemodialysis Conductivity (Meter): 14 (01/28/16 0838)  Dialyzer Lot Number: [id number] (01/28/16 3762)  Tubing Lot Number: [id number] (01/28/16 0838)  RO Machine Log Completed: Yes (01/28/16 8315)    Chlorine Testing:  RO/Hemodialysis Machine Safety Checks  Is Total Chlorine less than 0.1 ppm?: Yes (01/28/16 0838)  Orignial Total Chlorine Testing Time: 0845 (01/28/16 0838)  At 4 Hour Total Chlorine Testing Time: 1245 (01/28/16 0838)    Vitals and Weight:  Patient Vitals for the past 4 hrs:   BP Temp Pulse Resp   01/28/16 1350 (!) 224/117 98.3 F (36.8 C) (!) 115 18   01/28/16 1346 (!) 212/118 - (!) 108 -   01/28/16 1330 (!) 148/120 - - -   01/28/16 1300 (!) 154/103 - (!) 113 -   01/28/16 1230 (!) 185/113 - (!) 109 -   01/28/16 1200 (!) 190/106 - (!) 107 -   01/28/16 1130 (!) 189/110 - (!) 110 -   01/28/16 1100 (!) 181/110 - (!) 109 -   01/28/16 1030 (!) 166/109 - (!) 108 -   01/28/16 1016 (!) 186/99 - (!) 101 -   01/28/16 1011 (!) 182/110 98.6 F (37 C) (!) 101 20        Dialysis Weight  Pre-Treatment Weight (Kg): 96.8 (01/28/16 0838)  Scale Type: Floor Scale (01/26/16 0930)  Post-Treatment Weight (Kg): 94.8 (01/28/16 1350)    Treatment:      01/28/16 1011 01/28/16 1016 01/28/16 1030   Vitals   Temp 98.6 F (37 C) --  --    Heart Rate  (!) 101 (!) 101 (!) 108   Resp Rate 20 --  --    BP (!) 182/110 (!) 186/99 (!) 166/109   O2 Device Nasal cannula Nasal cannula Nasal cannula   Animal nutritionist --  Yes --    Blood Flow Rate (mL/min) --  350 mL/min 350 mL/min   Arterial Pressure (mmHg) --  -140 mmHg -150 mmHg   Venous Pressure (mmHg) --  130 140   Dialysate Flow Rate (mL/min) --  600 mL/min 600 mL/min   Transmembrane Pressure (mmHg) --  70 mmHg 70 mmHg   Ultrafiltration Rate (mL/Hr) --  710 mL/hr 710 mL/hr   Fluid Removal (ml) --  0 ml 127 ml   Fluid Bolus (ml) --  200 ml 0 ml   Dialysate K (mEq/L) --  2 mEq/L --    Dialysate CA (mEq/L) --  2.5 mEq/L --    Hemodialysis Comments   Arteriovenous Lines Secure --  Yes Yes   Comments --  HD initiated w/o diff. Lines reversed to ensure adequate bloodflow. Pt stable Pt remains stable.       01/28/16 1100 01/28/16 1130 01/28/16 1200   Vitals   Temp --  --  --    Heart Rate (!) 109 (!) 110 (!) 107   Resp Rate --  --  --    BP (!) 181/110 (!) 189/110 (!) 190/106   O2 Device Nasal cannula Nasal cannula Nasal cannula   Animal nutritionist --  --  --    Blood Flow Rate (mL/min) 350 mL/min 350 mL/min 350 mL/min   Arterial Pressure (mmHg) -160 mmHg -150 mmHg -150 mmHg   Venous Pressure (mmHg) 140 130 120   Dialysate Flow Rate (mL/min) 600 mL/min 600 mL/min 600 mL/min   Transmembrane Pressure (mmHg) 70 mmHg 70 mmHg 70 mmHg   Ultrafiltration Rate (mL/Hr) 710 mL/hr 710 mL/hr 710 mL/hr   Fluid Removal (ml) 540 ml 887 ml 1214 ml   Fluid Bolus (ml) 0 ml 0 ml 0 ml   Dialysate K (mEq/L) --  --  --    Dialysate CA (mEq/L) --  --  --    Hemodialysis Comments   Arteriovenous Lines Secure Yes Yes Yes   Comments Pt resting with eyes closed Pt requesting to end tx. Dr Kai Levins addressed issue . Pt agreeable to continuing tx at this time. Pt resting quietly        01/28/16 1230 01/28/16 1300 01/28/16 1330   Vitals   Temp --  --  --    Heart Rate (!) 109 (!) 113  --    Resp Rate --  --  --    BP (!) 185/113 (!) 154/103 (!) 148/120   O2 Device Nasal cannula Nasal cannula Nasal cannula   Machine Metrics   Blood Flow Rate (mL/min) 350 mL/min 350 mL/min 350 mL/min   Arterial Pressure (mmHg) -150 mmHg -150 mmHg -160 mmHg   Venous Pressure (mmHg) 110 100 190   Dialysate Flow Rate (mL/min) 600 mL/min 600 mL/min 600 mL/min   Transmembrane Pressure (mmHg) 70 mmHg 100 mmHg 100 mmHg   Ultrafiltration Rate (mL/Hr) 720 mL/hr 720 mL/hr 720 mL/hr  Fluid Removal (ml) 1642 ml 1933 ml 2293 ml   Fluid Bolus (ml) 0 ml 0 ml 0 ml   Hemodialysis Comments   Arteriovenous Lines Secure Yes Yes Yes   Comments Pt states that pain med is effective in managing his pain at this time Pt remains stable Pt resting watching tv       01/28/16 1346 01/28/16 1350   Vitals   Temp --  98.3 F (36.8 C)   Heart Rate (!) 108 (!) 115   Resp Rate --  18   BP (!) 212/118 (!) 224/117   O2 Device Nasal cannula Nasal cannula   Machine Metrics   Blood Flow Rate (mL/min) 350 mL/min --    Arterial Pressure (mmHg) -150 mmHg --    Venous Pressure (mmHg) 200 --    Dialysate Flow Rate (mL/min) 600 mL/min --    Transmembrane Pressure (mmHg) 70 mmHg --    Ultrafiltration Rate (mL/Hr) 720 mL/hr --    Fluid Removal (ml) 2500 ml --    Fluid Bolus (ml) 300 ml --    Hemodialysis Comments   Arteriovenous Lines Secure Yes --    Comments HD completed. Blood returned. Pt stable and alert Post tx vs. Report called to PN          Intake/Output:  I/O this shift:  In: -   Out: 2000 [Other:2000]      Medications:  Current Facility-Administered Medications   Medication Dose Route Last Rate Last Dose   . heparin (porcine) injection 1,000-3,000 Units  1,000-3,000 Units Intracatheter            Education:  Education  Person taught: Patient (01/28/16 1350)  Knowledge basis: Substantial (01/28/16 1350)  Topics taught: Procedure (01/28/16 1350)  Teaching Tools: Explain (01/28/16 1350)  ReponseTrenton Gammon Understanding (01/28/16 1350)    Primary  Nurse Communication:  Bedside Nurse Communication  Name of bedside RN - pre dialysis: Francoise Schaumann RN (01/28/16 1610)  Name of bedside RN - post dialysis: Francoise Schaumann RN (01/28/16 1350)      [facility] nurse signature/date/time: Andre Lefort RN 01/28/2016@ 1413

## 2016-01-29 ENCOUNTER — Inpatient Hospital Stay: Payer: Medicare Other

## 2016-01-29 NOTE — Progress Notes (Addendum)
1215: Pt's blood pressure has been high throughout the morning. Gave pt all scheduled BP meds. BP is 190/100 manually HR 103. MD notified. Pt refusing PRN IV Hydralazine. I educated the pt on the risks of consistently having a high blood pressure. Pt states " this is normal for me." Gave pt PRN IV Dilaudid upon request. Pt states " my blood pressure is just up because of my pain level." While talking with pt he nods off throughout the conversation. Will recheck BP in 30 minutes and continue to educate pt on the importance of lowering his blood pressure.       1250: Manual BP 172/88. HR 107. Pt still refusing IV hydralazine. Pt states " it's coming down, I don't think I need anything." Will recheck manual BP and continue to monitor pt.      1445: Manual BP 194/96. Scheduled PO Hydralazine given per order.     1530: Recheck BP 160/100. HR 99. Will continue to monitor.

## 2016-01-29 NOTE — Progress Notes (Signed)
CM received a call from [place] at [facility] earlier this morning. She reports again that Margarita Sermons, MD HD center has declined pt a chair. She reports that the [facility] (where he originally started) wants him to call them to discuss possible chair offer. CM spoke w/ Pt and he reports that he does not want to talk w/ that clinic and he wants "nothing to do with them". He reports that he does not like the "nurses and doctors there". CM asked Pt if that is why he has not been getting HD; he reported yes. CM then called [place] at [facility] again to discuss. [place] discloses to this CM that Pt is "known" to [facility] and he has been at several clinics in the past. He has been at [facility], [address], [place], [place] and [facility]. CM again asked if [place] can send a referral to other clinics; she reports she can only do one at a time. Pt reports that he is willing to go to [facility]. [place] inquired of chair times there and there is no 3rd shift. [place] went ahead and looked at the others listed above and the only place that has a 3rd shift is [facility] (5:40pm). CM asked Pt if he is familiar with them and he said yes and is in agreement to go to them.     Waiting now for final approval from [facility].     Jorell Agne RNCM  [phone]

## 2016-01-29 NOTE — Plan of Care (Addendum)
Problem: Renal Instability  Goal: Fluid and electrolyte balance are achieved/maintained  Outcome: Progressing   01/29/16 2013   Goal/Interventions addressed this shift   Fluid and electrolyte balance are achieved/maintained  Monitor intake and output every shift;Monitor/assess lab values and report abnormal values;Monitor daily weight;Provide adequate hydration;Assess for confusion/personality changes;Observe for seizure activity and initiate seizure precautions if indicated;Observe for cardiac arrhythmias;Monitor for muscle weakness;Assess and reassess fluid and electrolyte status       Comments: Patient continues to decline IV blood pressure medications noting that his pills will control him to his 'normal levels'. Will continue to monitor throughout this shift.     Shift summary: Telemetry SR/ST with HR 90s-120s. Patient requested IV dilaudid throughout the shift. IV zofran also given for upset stomach. Patient refused nitro paste this AM. Patient also refused any IV BP medications but took all PO that was ordered this shift. Will continue to monitor until the end of this shift.

## 2016-01-29 NOTE — Plan of Care (Signed)
Problem: Renal Instability  Goal: Fluid and electrolyte balance are achieved/maintained  Outcome: Progressing  Pt up independently in room. Gait steady. C/o 10/10 pain this AM. PRN Dilaudid given. Pt also requested PRN IV Zofran.         01/28/16 2237   Goal/Interventions addressed this shift   Fluid and electrolyte balance are achieved/maintained  Monitor intake and output every shift;Monitor/assess lab values and report abnormal values;Provide adequate hydration;Monitor daily weight;Assess for confusion/personality changes;Assess and reassess fluid and electrolyte status;Follow fluid restrictions/IV/PO parameters       Problem: Patient Receiving Advanced Renal Therapies  Goal: Therapy access site remains intact  Outcome: Progressing  Dressing to permacath site is clean, dry, and intact. Both ports are wrapped in gauze. Dressing change is due on 10/16.       01/27/16 0343   Goal/Interventions addressed this shift   Therapy access site remains intact Assess therapy access site       Problem: Pain interferes with ability to perform ADL  Goal: Pain at adequate level as identified by patient  Outcome: Progressing  Pt requesting PRN IV Dilaudid soon after it is time to give another dose. Often c/o severe abdominal pain. After receiving PRN IV Dilaudid pain improves for a short period per the pt. Will continue to monitor. Respirations normal. Pt up in room frequently. BP has been elevated today. Scheduled BP meds given per MD order.           01/27/16 1756   Goal/Interventions addressed this shift   Pain at adequate level as identified by patient Identify patient comfort function goal;Assess for risk of opioid induced respiratory depression, including snoring/sleep apnea. Alert healthcare team of risk factors identified.;Assess pain on admission, during daily assessment and/or before any "as needed" intervention(s);Reassess pain within 30-60 minutes of any procedure/intervention, per Pain Assessment, Intervention,  Reassessment (AIR) Cycle;Evaluate if patient comfort function goal is met;Evaluate patient's satisfaction with pain management progress;Offer non-pharmacological pain management interventions;Consult/collaborate with Pain Service;Consult/collaborate with Physical Therapy, Occupational Therapy, and/or Speech Therapy;Include patient/patient care companion in decisions related to pain management as needed       Problem: Side Effects from Pain Analgesia  Goal: Patient will experience minimal side effects of analgesic therapy  Outcome: Progressing   01/28/16 1510   Goal/Interventions addressed this shift   Patient will experience minimal side effects of analgesic therapy Monitor/assess patient's respiratory status (RR depth, effort, breath sounds);Assess for changes in cognitive function;Prevent/manage side effects per LIP orders (i.e. nausea, vomiting, pruritus, constipation, urinary retention, etc.);Evaluate for opioid-induced sedation with appropriate assessment tool (i.e. POSS)       Problem: Moderate/High Fall Risk Score >5  Goal: Patient will remain free of falls  Outcome: Progressing   01/29/16 0710   OTHER   Moderate Risk (6-13) MOD-Initiate Yellow "Fall Risk" magnet communication tool;MOD-([facility] Only) Yellow "Fall Risk" signage;MOD-([facility] Only) Yellow slippers;MOD-([facility] Only) Apply yellow "Fall Risk" arm band;MOD-Consider activation of bed alarm if appropriate;MOD-([facility] Only) Place "Reset Bed Alarm" sign above bed if in use

## 2016-01-29 NOTE — Progress Notes (Signed)
Renal Progress Note    Follow up for:  ESRD    Subjective:     No complaints to me  Nursing notes reviewed-continues to decline medications at times  Tolerated dialysis well yesterday with 2 L UF    Objective:     Vitals:  BP (!) 160/100   Pulse 99   Temp 98.4 F (36.9 C) (Oral)   Resp 18   Ht 1.854 m (6\' 1" )   Wt 99.7 kg (219 lb 11.2 oz)   SpO2 99%   BMI 28.99 kg/m     Wt Readings from Last 1 Encounters:   01/29/16 1410 99.7 kg (219 lb 11.2 oz)   01/28/16 0542 96.8 kg (213 lb 4.8 oz)   01/27/16 0445 99.8 kg (220 lb 1.6 oz)   01/26/16 0435 100.9 kg (222 lb 6.4 oz)   01/26/16 0358 100.9 kg (222 lb 6.4 oz)         Intake/Output Summary (Last 24 hours) at 01/29/16 1558  Last data filed at 01/29/16 0019   Gross per 24 hour   Intake              450 ml   Output                0 ml   Net              450 ml       Physical Exam:  General appearance - alert, in no distress  Chest - clear to auscultation, good respiratory effort  Heart - normal rate and regular rhythm, no rub appreciated  Abdomen - soft,  nondistended, bowel sounds normal  Extremities - trace edema  Skin-warm and dry    Labs:  Labs reviewed.  Pertinent findings below.      Recent Labs  Lab 01/28/16  1133   WBC 4.7   Hemoglobin 8.6*   Hematocrit 25.8*   PLT CT 133             Invalid input(s): LEUKOCYTESUR     Recent Labs  Lab 01/28/16  1133 01/27/16  0543 01/26/16  0000   Glucose 88 74 83   BUN 45* 34* 58*   Creatinine 8.94* 7.40* 11.37*   Sodium 136 136 140   Potassium 5.2 5.4* 5.7*   Chloride 104 105 107   CO2 22.0 19.5* 21.4   AST (SGOT)  --   --  15   ALT  --   --  8   Calcium 7.2* 7.5* 7.9*   Albumin  --   --  2.6*              Assessment:     1. 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 On chronic hemodialysis, sporadically. Most recently on a Monday Wednesday Friday schedule by his report.  2. Nonadherence to prescribed dialysis treatment regimen  3. Hyperkalemia-associated with missed dialysis treatment. Improved with  hemodialysis  4. Metabolic acidosis-associated with missed dialysis treatment. Improved with hemodialysis  5. Volume overload-associated with missed dialysis treatment. Improved with hemodialysis  6. Anemia of ESRD  7. Secondary hyperparathyroidism  8. Hypertension-suspected medication nonadherence as well as volume overload  9. C. difficile diarrhea    Plan:      Continue dialysis on usual MWF schedule, next due tomorrow   Maintain renal diet and fluid restrictions   Continue EPO with hemodialysis   Continue current antihypertensive medications, and encourage adherence to prescribed regimen   Assess blood pressure response to additional ultrafiltration -  Continue oral calcitriol (had been on this rather than IV activated vitamin D by review of previous outpatient standing orders)   Await outpatient dialysis chair assignment - case management actively working on this     Orders and Medications reviewed in Epic.    Versie Starks, MD  3:58 PM  01/29/2016

## 2016-01-29 NOTE — Progress Notes (Signed)
Date Time: 01/29/16 7:08 PM  Patient Name: [name], [name]       CSN: [id number]  Attending Physician: Jenness Corner, MD  Primary Care Provider: Christa See, MD    Interim summary [facility] course)     45 y.o. M with PMH of , ESRD  on hemodialysis over the past 7 years,  NON COMOPLIANT TO TRAETMENT,  anemia,   chronic systolic and diastolic congestive heart failure with LVEF of 35-40% and grade 2 diastolic dysfunction by 2-D echo in August 2017, HTN, mild intermittent asthma, tobacco dependence via cigarette smoking,WAS ADMITTED WITH     SOB AND CDAD .    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  Nephrology consulted ; started on HD   Pt has perma cath .  ID consulted         SUBJECTIVE:     CC: complaints of abdominal distention    Reports 3 x diarrhea despite vancomycin, now asking if he could get the fecal transplant  Still refusing his BP meds and states that how it has always been, refusing PRN hydralazine  PHYSICAL EXAM:   Temp:  [98.2 F (36.8 C)-98.7 F (37.1 C)] 98.2 F (36.8 C)  Heart Rate:  [99-110] 99  Resp Rate:  [18] 18  BP: (140-194)/(76-102) 160/100  Body mass index is 28.99 kg/m.    Intake/Output Summary (Last 24 hours) at 01/29/16 1908  Last data filed at 01/29/16 0019   Gross per 24 hour   Intake              450 ml   Output                0 ml   Net              450 ml     Weight Monitoring 01/25/2016 01/26/2016 01/26/2016 01/27/2016 01/28/2016 01/29/2016 01/29/2016   Height 185.4 cm 185.4 cm 185.4 cm - - - -   Height Method Stated Stated Stated - - - -   Weight 105.688 kg 100.88 kg 100.88 kg 99.837 kg 96.752 kg (No Data) 99.655 kg   Weight Method Stated Standing Scale Standing Scale Actual Standing Scale - Standing Scale   BMI (calculated) 30.8 kg/m2 29.4 kg/m2 29.4 kg/m2 - - - -         Exam:   General: moderately built, no acute distress ,  Maintained on O2 per NC  Psychiatry: Patient is  awake, alert and oriented x 3, mood is appropriate   Chest: CTA bilaterally. No crackles or wheezing. No accessory muscle use   CVS: S1, S2 normal, RRR, no thrill   Abdomen: soft and mildy tender r with good bowel sounds.  Musculoskeletal: No pitting edema, no cyanosis/clubbing. Expected ROM all 4 extremities. No joint swelling     MEDS: (SCHEDULED/INFUSIONS/PRN)     Current Facility-Administered Medications   Medication Dose Route Frequency   . albuterol-ipratropium  2 puff Inhalation Q6H   . amLODIPine  10 mg Oral Daily   . calcitRIOL  0.5 mcg Oral Mon-Wed-Fri   . carvedilol  25 mg Oral Q12H SCH   . dicyclomine  20 mg Oral QID   . famotidine  20 mg Oral Daily   . furosemide  80 mg Oral Daily   . heparin (porcine)  5,000 Units Subcutaneous Q12H [facility]   . hydrALAZINE  100 mg Oral Q8H SCH   .  nitroglycerin  1 inch Topical Q6H HOLD MN   . sevelamer  800 mg Oral TID MEALS   . vancomycin  500 mg Oral 4 times per day     Current Facility-Administered Medications   Medication Dose Route Frequency Last Rate     Current Facility-Administered Medications   Medication Dose Route   . acetaminophen  650 mg Oral    Or   . acetaminophen  650 mg per NG tube    Or   . acetaminophen  650 mg Rectal   . albuterol  2.5 mg Nebulization   . heparin (porcine)  1,000-3,000 Units Intracatheter   . hydrALAZINE  10 mg Intravenous   . HYDROmorphone  0.5 mg Intravenous   . naloxone  0.4 mg Intravenous   . ondansetron  8 mg Oral    Or   . ondansetron  4 mg Intravenous   . oxyCODONE-acetaminophen  1 tablet Oral   . prochlorperazine  10 mg Intravenous         LABS:       Recent Labs  Lab 01/28/16  1133 01/27/16  0543   WBC 4.7 5.9   RBC 2.74* 2.86*   Hemoglobin 8.6* 8.8*   Hematocrit 25.8* 26.9*   MCV 94 94   PLT CT 133 136         Recent Labs  Lab 01/28/16  1133 01/27/16  0543   Sodium 136 136   Potassium 5.2 5.4*   Chloride 104 105   CO2 22.0 19.5*   BUN 45* 34*   Creatinine 8.94* 7.40*   Glucose 88 74   EGFR 7* 9*   Calcium 7.2* 7.5*                     Recent Labs  Lab 01/26/16  0000   ALT 8   AST (SGOT) 15   Bilirubin, Total 0.6   Albumin 2.6*   Alkaline Phosphatase 218*                    No results found for: [id number]      IMAGING STUDIES:   Ct Abdomen Pelvis Dry  (stone)    Result Date: 01/26/2016  Volume overload with cardiomegaly, ascites, edema, anasarca. Complex ascites. ReadingStation:[email or web address]    Nm Lung Ventilation Perfusion Aerosol    Result Date: 01/08/2016   Low probability for pulmonary embolism. Collene Schlichter, MD 01/08/2016 9:32 AM     US Abdomen Limited Ruq    Result Date: 01/29/2016  1. Heterogeneous liver parenchyma likely related to underlying nonspecific medical liver disease. 2. Small volume of abdominal ascites with the largest pocket of fluid in the right upper quadrant. 3. Gallbladder absent. 4. Atrophic echogenic right kidney corresponding to chronic medical renal disease. ReadingStation:[email or web address]    Xr Chest Ap Portable    Result Date: 01/26/2016  Left IJ double lumen dialysis catheter tip at superior cavoatrial junction. Right subclavian vascular stent. Cardiomegaly. Mild pulmonary edema. No focal consolidation or suspicious infiltrates. Trace pleural effusions. 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 No findings to suggest acute DVT. Dimitrios  Papadouris, MD 01/01/2016 6:05 PM     US Venous Duplex Doppler Leg Bilateral    Result Date: 01/07/2016   No evidence for DVT. Charlott Rakes,  MD 01/07/2016 2:43 PM     US Abdomen Limited Single Organ    Result Date: 01/05/2016   Small to moderate volume of abdominal and pelvic ascites. Findings appear similar to that seen on 11/10/2015 CT, accounting for differences in technique Laurena Slimmer, MD 01/05/2016 8:30 AM       MICROBIOLOGY AND/OR TELEMETRY:       Stool + c diff     ASSESSMENT AND PLAN:     SOB  sec to   Volume overload from missed hemodialysis in patient with ESRD on HD   Due to medical non-compliance;  Nephrology following        Recurrent Clostridium difficile diarrhea  D/q Dr Thresa Ross on po vanco   Pt will need tapering vanco on [place]   He is asking to consider fecal transplant, for which he was referred to in [facility]      Accelerated essential HTN  Due to medical non-compliance  Cont BB   CCB   Increased  clonidine dose from 0.1 12 hourly to 0.2 mg 12 hourly .  BP is better controlled today still uncontrolled .    Systolic and diastolic CHF, chronic    Intake and output monitoring, daily weight  Telemetry monitoring  cont current meds   Cont HD as scheduled        Mild intermittent asthma with active tobacco smoking  Stable without exacerbation   Cont current meds     Recurrent ascites, only perihepatic          DVT Prophylaxis: Heparin    CODE Status : FULL CODE    Disposition: patient has multiple medical problems but had been noncompliant even during this hospitalization, unclear how much more we could offer other than HD at this time, despite explanation and importance of his meds    Plan discussed with patient, nursing staff.    Signed by: Jenness Corner, MD  [address]  [place]  Office Ph: [phone]    Please contact at phone number [phone] if any questions.          Note: This chart was generated by the Epic EMR system/speech recognition and may contain inherent errors or omissions not intended by the user. Grammatical errors, random word insertions, deletions, pronoun errors and incomplete sentences are occasional consequences of this technology due to software limitations. Not all errors are caught or corrected. If there are questions or concerns about the content of this note or information contained within the body of this dictation they should be addressed directly with the author for clarification

## 2016-01-30 ENCOUNTER — Inpatient Hospital Stay: Payer: Medicare Other

## 2016-01-30 LAB — BASIC METABOLIC PANEL
Anion Gap: 15.8 mMol/L (ref 7.0–18.0)
BUN / Creatinine Ratio: 5 Ratio — ABNORMAL LOW (ref 10.0–30.0)
BUN: 40 mg/dL — ABNORMAL HIGH (ref 7–22)
CO2: 24.3 mMol/L (ref 20.0–30.0)
Calcium: 7.7 mg/dL — ABNORMAL LOW (ref 8.5–10.5)
Chloride: 103 mMol/L (ref 98–110)
Creatinine: 7.98 mg/dL — ABNORMAL HIGH (ref 0.80–1.30)
EGFR: 8 mL/min/{1.73_m2} — ABNORMAL LOW (ref 60–150)
Glucose: 103 mg/dL — ABNORMAL HIGH (ref 70–99)
Osmolality Calc: 286 mOsm/kg (ref 275–300)
Potassium: 5.1 mMol/L (ref 3.5–5.3)
Sodium: 138 mMol/L (ref 136–147)

## 2016-01-30 LAB — HEMOGLOBIN AND HEMATOCRIT, BLOOD
Hematocrit: 27.4 % — ABNORMAL LOW (ref 39.0–52.5)
Hemoglobin: 8.6 gm/dL — ABNORMAL LOW (ref 13.0–17.5)

## 2016-01-30 MED ORDER — HYDROMORPHONE HCL 2 MG/ML IJ SOLN
0.5000 mg | Freq: Once | INTRAMUSCULAR | Status: AC
Start: 2016-01-30 — End: 2016-01-30
  Administered 2016-01-30: 0.5 mg via INTRAVENOUS
  Filled 2016-01-30: qty 1

## 2016-01-30 NOTE — Progress Notes (Signed)
01/30/16 1414   Case Management Quick Doc   Case Management Assessment Status Assessment Complete   CM Comments RNCM; 10/13:  awaiting final HD chair offer.  see detailed RNCM notes. c-diff +- PO vanco.    Expected Discharge Date 01/31/16   Physical Discharge Disposition Home, No Needs       Thaniel Coluccio K. Pinner-Sherrell Farish, RN BSN  Case Production designer, theatre/television/film   [facility]  [phone]

## 2016-01-30 NOTE — Progress Notes (Signed)
F/u phone call placed to Brittney with [facility] to inquire on an update on chair assignment for the patient at the [facility] in [place], [place].  Per Brittney the clinical manager from the unit will review the case and get back.  Reminded Brittney that the patient is requesting the last available chair time, for work purposes.     Jehu Mccauslin K. Pinner-Ejay Lashley, RN BSN  Case Production designer, theatre/television/film   [facility]  [phone]

## 2016-01-30 NOTE — Progress Notes (Signed)
Per Primary RN Luciana Axe and Charge Nurse Peggy, pt is refusing HD tx today. Dr Lajean Saver has been made aware.

## 2016-01-30 NOTE — Progress Notes (Signed)
Renal Progress Note    Follow up for:  ESRD    Subjective:     Patient called to dialysis this morning for routine HD but refused to go; no specific reasoning provided  Talking of leaving AMA  Continues to decline antihypertensive medications in spite of counseling regarding risks for CV events and mortality  No new complaints to me      Objective:     Vitals:  BP (!) 170/115   Pulse (!) 101   Temp 98.2 F (36.8 C) (Oral)   Resp 22   Ht 1.854 m (6\' 1" )   Wt 95.3 kg (210 lb 1.6 oz) Comment: Standing WT  SpO2 95%   BMI 27.72 kg/m     Wt Readings from Last 1 Encounters:   01/30/16 0524 95.3 kg (210 lb 1.6 oz)   01/29/16 1410 99.7 kg (219 lb 11.2 oz)   01/28/16 0542 96.8 kg (213 lb 4.8 oz)   01/27/16 0445 99.8 kg (220 lb 1.6 oz)   01/26/16 0435 100.9 kg (222 lb 6.4 oz)   01/26/16 0358 100.9 kg (222 lb 6.4 oz)       No intake or output data in the 24 hours ending 01/30/16 1630    Physical Exam:  General appearance - alert, in no distress  Chest - clear to auscultation, good respiratory effort  Heart - normal rate and regular rhythm, no rub appreciated  Abdomen - soft,  nondistended, bowel sounds normal  Extremities - trace edema  Skin-warm and dry      Labs:  Labs reviewed.  Pertinent findings below.    RECENT LABS (from the last 7 days)  Recent Labs      01/30/16   0533  01/28/16   1133   WBC   --   4.7   Hemoglobin  8.6*  8.6*   Hematocrit  27.4*  25.8*   PLT CT   --   133     No results for input(s): PT, INR, APTT in the last 72 hours.  No results for input(s): TROPI, CK, CKMBINDEX in the last 72 hours.  No results for input(s): BNP in the last 72 hours.      Xr Abdomen Portable    Result Date: 01/30/2016  FINDINGS/IMPRESSION: 1.  PAUCITY OF BOWEL GAS SEEN; CONSISTENT WITH FLUID-FILLED LARGE AND SMALL BOWEL LOOPS AS ON PRIOR CT. 2.  HAZINESS CONSISTENT WITH ASCITES. 3.  NO ABNORMAL ABDOMINAL CALCIFICATIONS. 4.  NO ACUTE BONY ABNORMALITY. ReadingStation:[email or web address]     Recent Labs      01/30/16   0533  01/28/16    1133   Glucose  103*  88   Sodium  138  136   Potassium  5.1  5.2   Chloride  103  104   CO2  24.3  22.0   BUN  40*  45*   Creatinine  7.98*  8.94*   EGFR  8*  7*   Calcium  7.7*  7.2*     No results for input(s): MG, PHOS in the last 72 hours.  No results for input(s): ALB, PROT, BILITOTAL, ALKPHOS, ALT, AST in the last 72 hours. No results for input(s): SGUR, LABPH, PROTEINUR, GLUUA, KETONESUA, BILIUA, BLOODUA, NITRITEUA, UROBILIUA, LEUA, NITRITEUA, WBCUA, RBCUA, BACTERIAUA in the last 72 hours.    Invalid input(s):  AMORPHOUSUA                   Assessment:     1. 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 On chronic hemodialysis, sporadically. Most recently on a Monday Wednesday Friday schedule by his report.  2. Nonadherence to prescribed dialysis And antihypertensive treatment regimen  3. Hyperkalemia-associated with missed dialysis treatment. Improved with hemodialysis  4. Metabolic acidosis-associated with missed dialysis treatment. Improved with hemodialysis  5. Volume overload-associated with missed dialysis treatment. Improved with hemodialysis  6. Anemia of ESRD  7. Secondary hyperparathyroidism  8. Hypertension-exacerbated by medication nonadherence as well as volume overload  9. C. difficile diarrhea      Plan:      Continue dialysis on usual MWF schedule.   Counseling provided today regarding sequelae of poor adherence to prescribed HD regimen and predictable consequences of failure to achieve goal BP, target weight, and biochemical/metabolic goals.  Presently behavior and insight does not appear to be responding to counseling/education.   Maintain renal diet and fluid restrictions   Continue EPO with hemodialysis   Continue to encourage adherence to antihypertensive regimen   Continue oral calcitriol (had been on this rather than IV activated vitamin D by review of previous outpatient standing orders)   Await outpatient dialysis chair assignment - case management actively working on this        Orders and Medications reviewed in Epic.  Case reviewed with RN    Cheral Marker, MD  4:30 PM  01/30/2016

## 2016-01-30 NOTE — Progress Notes (Signed)
Date Time: 01/30/16 6:15 PM  Patient Name: [name], [name]       CSN: [id number]  Attending Physician: Jenness Corner, MD  Primary Care Provider: Christa See, MD    Interim summary [facility] course)     45 y.o. M with PMH of , ESRD  on hemodialysis over the past 7 years,  NON COMOPLIANT TO TRAETMENT,  anemia,   chronic systolic and diastolic congestive heart failure with LVEF of 35-40% and grade 2 diastolic dysfunction by 2-D echo in August 2017, HTN, mild intermittent asthma, tobacco dependence via cigarette smoking,WAS ADMITTED WITH     SOB AND CDAD .    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  Nephrology consulted ; started on HD   Pt has perma cath .  ID consulted         SUBJECTIVE:     CC: complaints of abdominal distention    The patient refuses this this morning, reports that he is nauseated, no emesis noted by staff  Asking to increase his pain medications,  PHYSICAL EXAM:   Temp:  [98.2 F (36.8 C)] 98.2 F (36.8 C)  Heart Rate:  [99-106] 101  Resp Rate:  [18-22] 22  BP: (162-188)/(88-115) 170/115  Body mass index is 27.72 kg/m.  No intake or output data in the 24 hours ending 01/30/16 1815  Weight Monitoring 01/26/2016 01/26/2016 01/27/2016 01/28/2016 01/29/2016 01/29/2016 01/30/2016   Height 185.4 cm 185.4 cm - - - - -   Height Method Stated Stated - - - - -   Weight 100.88 kg 100.88 kg 99.837 kg 96.752 kg (No Data) 99.655 kg 95.301 kg   Weight Method Standing Scale Standing Scale Actual Standing Scale - Standing Scale Actual   BMI (calculated) 29.4 kg/m2 29.4 kg/m2 - - - - -         Exam:   General: moderately built, no acute distress ,  Maintained on O2 per NC  Psychiatry: Patient is awake, alert and oriented x 3, mood is appropriate   Chest: CTA bilaterally. No crackles or wheezing. No accessory muscle use   CVS: S1, S2 normal, RRR, no thrill   Abdomen: soft and firm, nontender,no rebound or  guarding, hypoactiv eBS  Musculoskeletal: No pitting edema, no cyanosis/clubbing. Expected ROM all 4 extremities. No joint swelling     MEDS: (SCHEDULED/INFUSIONS/PRN)     Current Facility-Administered Medications   Medication Dose Route Frequency   . albuterol-ipratropium  2 puff Inhalation Q6H   . amLODIPine  10 mg Oral Daily   . calcitRIOL  0.5 mcg Oral Mon-Wed-Fri   . carvedilol  25 mg Oral Q12H SCH   . dicyclomine  20 mg Oral QID   . famotidine  20 mg Oral Daily   . furosemide  80 mg Oral Daily   . heparin (porcine)  5,000 Units Subcutaneous Q12H [facility]   . hydrALAZINE  100 mg Oral Q8H SCH   . HYDROmorphone  0.5 mg Intravenous Once   . nitroglycerin  1 inch Topical Q6H HOLD [facility]   . sevelamer  800 mg Oral TID MEALS   . vancomycin  500 mg Oral 4 times per day     Current Facility-Administered Medications   Medication Dose Route Frequency Last Rate     Current Facility-Administered Medications   Medication Dose Route   . acetaminophen  650 mg Oral    Or   . acetaminophen  650 mg per NG tube    Or   . acetaminophen  650 mg Rectal   . albuterol  2.5 mg Nebulization   . heparin (porcine)  1,000-3,000 Units Intracatheter   . hydrALAZINE  10 mg Intravenous   . HYDROmorphone  0.5 mg Intravenous   . naloxone  0.4 mg Intravenous   . ondansetron  8 mg Oral    Or   . ondansetron  4 mg Intravenous   . oxyCODONE-acetaminophen  1 tablet Oral   . prochlorperazine  10 mg Intravenous         LABS:       Recent Labs  Lab 01/30/16  0533 01/28/16  1133 01/27/16  0543   WBC  --  4.7 5.9   RBC  --  2.74* 2.86*   Hemoglobin 8.6* 8.6* 8.8*   Hematocrit 27.4* 25.8* 26.9*   MCV  --  94 94   PLT CT  --  133 136         Recent Labs  Lab 01/30/16  0533 01/28/16  1133   Sodium 138 136   Potassium 5.1 5.2   Chloride 103 104   CO2 24.3 22.0   BUN 40* 45*   Creatinine 7.98* 8.94*   Glucose 103* 88   EGFR 8* 7*   Calcium 7.7* 7.2*               Recent Labs  Lab 01/26/16  0000   ALT 8   AST (SGOT) 15   Bilirubin, Total 0.6   Albumin 2.6*   Alkaline  Phosphatase 218*                    No results found for: HGBA1CPERCNT      IMAGING STUDIES:   Ct Abdomen Pelvis Dry  (stone)    Result Date: 01/26/2016  Volume overload with cardiomegaly, ascites, edema, anasarca. Complex ascites. ReadingStation:[email or web address]    Nm Lung Ventilation Perfusion Aerosol    Result Date: 01/08/2016   Low probability for pulmonary embolism. Collene Schlichter, MD 01/08/2016 9:32 AM     US Abdomen Limited Ruq    Result Date: 01/29/2016  1. Heterogeneous liver parenchyma likely related to underlying nonspecific medical liver disease. 2. Small volume of abdominal ascites with the largest pocket of fluid in the right upper quadrant. 3. Gallbladder absent. 4. Atrophic echogenic right kidney corresponding to chronic medical renal disease. ReadingStation:[email or web address]    Xr Chest Ap Portable    Result Date: 01/26/2016  Left IJ double lumen dialysis catheter tip at superior cavoatrial junction. Right subclavian vascular stent. Cardiomegaly. Mild pulmonary edema. No focal consolidation or suspicious infiltrates. Trace pleural effusions. 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 No findings to suggest acute DVT. Dimitrios  Papadouris, MD 01/01/2016 6:05 PM     US Venous Duplex Doppler Leg Bilateral    Result Date: 01/07/2016   No evidence for DVT. Charlott Rakes, MD 01/07/2016 2:43 PM     Xr Abdomen Portable    Result Date: 01/30/2016  FINDINGS/IMPRESSION: 1.  PAUCITY OF BOWEL GAS SEEN; CONSISTENT WITH FLUID-FILLED LARGE AND SMALL BOWEL LOOPS AS ON PRIOR CT. 2.  HAZINESS CONSISTENT WITH ASCITES. 3.  NO ABNORMAL ABDOMINAL CALCIFICATIONS. 4.  NO ACUTE BONY ABNORMALITY. ReadingStation:[email or web address]    US Abdomen  Limited Single Organ    Result Date: 01/05/2016   Small to moderate volume of abdominal and pelvic ascites. Findings appear  similar to that seen on 11/10/2015 CT, accounting for differences in technique Laurena Slimmer, MD 01/05/2016 8:30 AM       MICROBIOLOGY AND/OR TELEMETRY:       Stool + c diff     ASSESSMENT AND PLAN:     SOB sec to   Volume overload from missed hemodialysis in patient with ESRD on HD   Due to medical non-compliance;  Nephrology following        Recurrent Clostridium difficile diarrhea  Continue vanco  obtian Xray of the abdomen, no further escalation of pain meds      Accelerated essential HTN  Due to medical non-compliance  Cont BB   CCB   Increased  clonidine dose from 0.1 12 hourly to 0.2 mg 12 hourly .  BP is better controlled today still uncontrolled .    Systolic and diastolic CHF, chronic    Intake and output monitoring, daily weight  Telemetry monitoring  cont current meds   Cont HD as scheduled        Mild intermittent asthma with active tobacco smoking  Stable without exacerbation   Cont current meds     Recurrent ascites, only perihepatic        DVT Prophylaxis: Heparin    CODE Status : FULL CODE    Disposition: unclear what really his goals are, very noncompliant and have every reason why he would not do as advsied  Will need to have a plan of approach; will obtain palliative care consult for ACP, and will meet with patient together with staff ie nursing and CM next week    Plan discussed with patient, nursing staff.    Signed by: Jenness Corner, MD  [address]  [place]  Office Ph: [phone]    Please contact at phone number [phone] if any questions.          Note: This chart was generated by the Epic EMR system/speech recognition and may contain inherent errors or omissions not intended by the user. Grammatical errors, random word insertions, deletions, pronoun errors and incomplete sentences are occasional consequences of this technology due to software limitations. Not all errors are caught or corrected. If there are questions or concerns about the content of this note or  information contained within the body of this dictation they should be addressed directly with the author for clarification

## 2016-01-30 NOTE — Plan of Care (Addendum)
Problem: Renal Instability  Goal: Fluid and electrolyte balance are achieved/maintained  Outcome: Not Progressing   01/30/16 2136   Goal/Interventions addressed this shift   Fluid and electrolyte balance are achieved/maintained  Monitor intake and output every shift;Monitor/assess lab values and report abnormal values;Provide adequate hydration;Monitor daily weight;Assess for confusion/personality changes;Assess and reassess fluid and electrolyte status;Observe for cardiac arrhythmias       Problem: Pain interferes with ability to perform ADL  Goal: Pain at adequate level as identified by patient  Outcome: Not Progressing   01/30/16 2136   Goal/Interventions addressed this shift   Pain at adequate level as identified by patient Identify patient comfort function goal;Assess for risk of opioid induced respiratory depression, including snoring/sleep apnea. Alert healthcare team of risk factors identified.;Reassess pain within 30-60 minutes of any procedure/intervention, per Pain Assessment, Intervention, Reassessment (AIR) Cycle;Evaluate if patient comfort function goal is met;Evaluate patient's satisfaction with pain management progress       Comments: Patient refusing to comply with treatment today. Requesting extra doses of IV dilaudid, per MD, patient not to receive extra doses. Patient refusing PO percocet.   Asked patient to allow staff to visualize stool for assessment. Will continue to monitor throughout this shift.     Shift summary: patient continues to decline IV BP meds and takes PO. BP remained elevated throughout the shift. Pain continued in abdomen 9-10/10. IV dilaudid given per pt request every 3 hours. Patient refused to take PO percocet.   Patient complained of right side facial/neck swelling. Patient has been laying exclusively on his right side. Encouraged patient to rotate and accept treatment with dialysis for fluid retention. No issues breathing upon assessment or per pt. On call physician was  made aware incase it progressed. No issues thus far this shift. Will continue to monitor until the end of this shift.

## 2016-01-30 NOTE — Plan of Care (Signed)
Problem: Renal Instability  Goal: Fluid and electrolyte balance are achieved/maintained  Outcome: Not Progressing  Pt refused HD today. Notified dialysis nurse and she notified Dr. Lajean Saver. Pt did take some of his am BP medications and refused the rest.     Problem: Patient Receiving Advanced Renal Therapies  Goal: Therapy access site remains intact  Outcome: Progressing   01/27/16 0343   Goal/Interventions addressed this shift   Therapy access site remains intact Assess therapy access site       Problem: Pain interferes with ability to perform ADL  Goal: Pain at adequate level as identified by patient  Outcome: Not Progressing  Pt asking for Dilaudid every two and a half hours. Becomes agitated when informed it is too soon and demands to know when it is due. Once informed pt C/O his abd pain and rings the moment it is due. Since the MD rounded this am, the pt has asked continually for more Dilaudid. Started to ring every five minutes demanding that the nurse call the doctor. Informed the pt that the doctor was in a meeting and would call the nurse back. Pt sleeping between ringing for the nurse and when the nurse or aid comes to the room. The pt has to be woken up to see what he is ringing for. Denies other needs.    Problem: Side Effects from Pain Analgesia  Goal: Patient will experience minimal side effects of analgesic therapy  Outcome: Not Progressing  Pt requesting more pain medication.     Problem: Moderate/High Fall Risk Score >5  Goal: Patient will remain free of falls  Outcome: Progressing  Pt up ab lib.    01/29/16 0710   OTHER   Moderate Risk (6-13) MOD-Initiate Yellow "Fall Risk" magnet communication tool;MOD-([facility] Only) Yellow "Fall Risk" signage;MOD-([facility] Only) Yellow slippers;MOD-([facility] Only) Apply yellow "Fall Risk" arm band;MOD-Consider activation of bed alarm if appropriate;MOD-(V[facility] Only) Place "Reset Bed Alarm" sign above bed if in use

## 2016-01-30 NOTE — Progress Notes (Signed)
Received a return phone call from Arnold at [facility].  Per [facility] in [place], [place] declined the patient d/t non compliance.  Per Brittney patient is not eligible to go to [facility] facilities b/c he resides in [place]. Per Brittney she has contacted several centers in the patients area and they have all declned him d/t non compliance.[facility], [place] clinic (where patient was previously) clinical manager, Raynelle [phone], has requested to speak with the patient prior to agreeing to assign a chair. Met with the patient at the bedside w/ RN charge nurse present to notify him of the above. Patient refused to speak with Raynelle Fanning at the [place], [place] clinic.  He stated that he is not happy with the services from the MD or the nurses at their clinic and is not agreeable to go to their clinic or speak with Raynelle Fanning.  Expressed to the patient that his options are limited.  Updated the attending Md of the above. Patient refused inpatient HD today.      Yaacov Koziol K. Pinner-Twan Harkin, RN BSN  Case Production designer, theatre/television/film   [facility]  [phone]

## 2016-01-31 ENCOUNTER — Inpatient Hospital Stay: Payer: Medicare Other

## 2016-01-31 LAB — BASIC METABOLIC PANEL
Anion Gap: 18.4 mMol/L — ABNORMAL HIGH (ref 7.0–18.0)
BUN / Creatinine Ratio: 6 Ratio — ABNORMAL LOW (ref 10.0–30.0)
BUN: 54 mg/dL — ABNORMAL HIGH (ref 7–22)
CO2: 20.6 mMol/L (ref 20.0–30.0)
Calcium: 7.9 mg/dL — ABNORMAL LOW (ref 8.5–10.5)
Chloride: 103 mMol/L (ref 98–110)
Creatinine: 9.03 mg/dL — ABNORMAL HIGH (ref 0.80–1.30)
EGFR: 7 mL/min/{1.73_m2} — ABNORMAL LOW (ref 60–150)
Glucose: 67 mg/dL — ABNORMAL LOW (ref 70–99)
Osmolality Calc: 285 mOsm/kg (ref 275–300)
Potassium: 6 mMol/L (ref 3.5–5.3)
Sodium: 136 mMol/L (ref 136–147)

## 2016-01-31 MED ORDER — SODIUM POLYSTYRENE SULFONATE 15 GM/60ML PO SUSP
30.0000 g | Freq: Once | ORAL | Status: AC
Start: 2016-01-31 — End: 2016-01-31
  Administered 2016-01-31: 30 g via ORAL
  Filled 2016-01-31 (×2): qty 120

## 2016-01-31 MED ORDER — ALBUTEROL SULFATE HFA 108 (90 BASE) MCG/ACT IN AERS
2.0000 | INHALATION_SPRAY | Freq: Four times a day (QID) | RESPIRATORY_TRACT | Status: DC | PRN
Start: 2016-01-31 — End: 2016-02-06
  Administered 2016-01-31 – 2016-02-04 (×2): 2 via RESPIRATORY_TRACT
  Filled 2016-01-31 (×2): qty 1

## 2016-01-31 NOTE — Progress Notes (Signed)
Date Time: 01/31/16 5:29 PM  Patient Name: [name], [name]       CSN: [id number]  Attending Physician: Jenness Corner, MD  Primary Care Provider: Christa See, MD    Interim summary [facility] course)     45 y.o. M with PMH of , ESRD  on hemodialysis over the past 7 years,  NON COMOPLIANT TO TRAETMENT,  anemia,   chronic systolic and diastolic congestive heart failure with LVEF of 35-40% and grade 2 diastolic dysfunction by 2-D echo in August 2017, HTN, mild intermittent asthma, tobacco dependence via cigarette smoking,WAS ADMITTED WITH     SOB AND CDAD .    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  Nephrology consulted ; started on HD   Pt has perma cath .  ID consulted         SUBJECTIVE:     CC: complaints of abdominal distention    Patient continues to refuse HD stating he is not feeling well  Maintained on nasal cannula  PHYSICAL EXAM:   Temp:  [98 F (36.7 C)-98.8 F (37.1 C)] 98.2 F (36.8 C)  Heart Rate:  [101-102] 101  Resp Rate:  [18-19] 19  BP: (159-185)/(60-100) 185/65  Body mass index is 29.53 kg/m.    Intake/Output Summary (Last 24 hours) at 01/31/16 1729  Last data filed at 01/31/16 1408   Gross per 24 hour   Intake              690 ml   Output                0 ml   Net              690 ml     Weight Monitoring 01/26/2016 01/27/2016 01/28/2016 01/29/2016 01/29/2016 01/30/2016 01/31/2016   Height 185.4 cm - - - - - -   Height Method Stated - - - - - -   Weight 100.88 kg 99.837 kg 96.752 kg (No Data) 99.655 kg 95.301 kg 101.538 kg   Weight Method Standing Scale Actual Standing Scale - Standing Scale Actual Standing Scale   BMI (calculated) 29.4 kg/m2 - - - - - -         Exam:   General: moderately built, no acute distress ,  Maintained on O2 per NC  Psychiatry: Patient is awake, alert and oriented x 3, mood is appropriate   Chest: CTA bilaterally. No crackles or wheezing. No accessory muscle use      CVS: S1, S2 normal, RRR, no thrill   Abdomen: soft and firm, nontender,no rebound or guarding, hypoactiv eBS  Musculoskeletal: No pitting edema, no cyanosis/clubbing. Expected ROM all 4 extremities. No joint swelling     MEDS: (SCHEDULED/INFUSIONS/PRN)     Current Facility-Administered Medications   Medication Dose Route Frequency   . albuterol-ipratropium  2 puff Inhalation Q6H   . amLODIPine  10 mg Oral Daily   . calcitRIOL  0.5 mcg Oral Mon-Wed-Fri   . carvedilol  25 mg Oral Q12H SCH   . dicyclomine  20 mg Oral QID   . famotidine  20 mg Oral Daily   . furosemide  80 mg Oral Daily   . heparin (porcine)  5,000 Units Subcutaneous Q12H [facility]   . hydrALAZINE  100 mg Oral Q8H SCH   . nitroglycerin  1 inch Topical Q6H HOLD [facility]   . sevelamer  800 mg Oral  TID MEALS   . vancomycin  500 mg Oral 4 times per day     Current Facility-Administered Medications   Medication Dose Route Frequency Last Rate     Current Facility-Administered Medications   Medication Dose Route   . acetaminophen  650 mg Oral    Or   . acetaminophen  650 mg per NG tube    Or   . acetaminophen  650 mg Rectal   . albuterol  2 puff Inhalation   . albuterol  2.5 mg Nebulization   . heparin (porcine)  1,000-3,000 Units Intracatheter   . hydrALAZINE  10 mg Intravenous   . HYDROmorphone  0.5 mg Intravenous   . naloxone  0.4 mg Intravenous   . ondansetron  8 mg Oral    Or   . ondansetron  4 mg Intravenous   . oxyCODONE-acetaminophen  1 tablet Oral   . prochlorperazine  10 mg Intravenous         LABS:       Recent Labs  Lab 01/30/16  0533 01/28/16  1133 01/27/16  0543   WBC  --  4.7 5.9   RBC  --  2.74* 2.86*   Hemoglobin 8.6* 8.6* 8.8*   Hematocrit 27.4* 25.8* 26.9*   MCV  --  94 94   PLT CT  --  133 136         Recent Labs  Lab 01/31/16  0557 01/30/16  0533   Sodium 136 138   Potassium 6.0* 5.1   Chloride 103 103   CO2 20.6 24.3   BUN 54* 40*   Creatinine 9.03* 7.98*   Glucose 67* 103*   EGFR 7* 8*   Calcium 7.9* 7.7*               Recent Labs  Lab  01/26/16  0000   ALT 8   AST (SGOT) 15   Bilirubin, Total 0.6   Albumin 2.6*   Alkaline Phosphatase 218*                    No results found for: HGBA1CPERCNT      IMAGING STUDIES:   Ct Abdomen Pelvis Dry  (stone)    Result Date: 01/26/2016  Volume overload with cardiomegaly, ascites, edema, anasarca. Complex ascites. ReadingStation:[email or web address]    Xr Chest 2 Views    Result Date: 01/31/2016  1.  NO SIGNIFICANT CHANGE FROM 5 DAYS PRIOR. 2.  PERSISTENT SMALL EFFUSIONS AND MILD PULMONARY EDEMA. 3.  DEVELOPING RIGHT LUNG BASE CONSOLIDATION NOT EXCLUDED, THOUGH FELT DUE TO EDEMA. ReadingStation:[email or web address]    Nm Lung Ventilation Perfusion Aerosol    Result Date: 01/08/2016   Low probability for pulmonary embolism. Collene Schlichter, MD 01/08/2016 9:32 AM     US Abdomen Limited Ruq    Result Date: 01/29/2016  1. Heterogeneous liver parenchyma likely related to underlying nonspecific medical liver disease. 2. Small volume of abdominal ascites with the largest pocket of fluid in the right upper quadrant. 3. Gallbladder absent. 4. Atrophic echogenic right kidney corresponding to chronic medical renal disease. ReadingStation:[email or web address]    Xr Chest Ap Portable    Result Date: 01/26/2016  Left IJ double lumen dialysis catheter tip at superior cavoatrial junction. Right subclavian vascular stent. Cardiomegaly. Mild pulmonary edema. No focal consolidation or suspicious infiltrates. Trace pleural effusions. 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 No findings to suggest acute DVT. Dimitrios  Papadouris, MD 01/01/2016 6:05 PM     US Venous Duplex Doppler Leg Bilateral    Result Date: 01/07/2016   No evidence for DVT. Charlott Rakes, MD 01/07/2016 2:43 PM     Xr Abdomen Portable    Result Date: 01/30/2016  FINDINGS/IMPRESSION: 1.  PAUCITY OF BOWEL GAS SEEN;  CONSISTENT WITH FLUID-FILLED LARGE AND SMALL BOWEL LOOPS AS ON PRIOR CT. 2.  HAZINESS CONSISTENT WITH ASCITES. 3.  NO ABNORMAL ABDOMINAL CALCIFICATIONS. 4.  NO ACUTE BONY ABNORMALITY. ReadingStation:[email or web address]    US Abdomen Limited Single Organ    Result Date: 01/05/2016   Small to moderate volume of abdominal and pelvic ascites. Findings appear similar to that seen on 11/10/2015 CT, accounting for differences in technique Laurena Slimmer, MD 01/05/2016 8:30 AM       MICROBIOLOGY AND/OR TELEMETRY:       Stool + c diff     ASSESSMENT AND PLAN:     SOB sec to   Volume overload from missed hemodialysis in patient with ESRD on HD   Due to medical non-compliance;  Nephrology following        Recurrent Clostridium difficile diarrhea  Continue vanco  obtian Xray of the abdomen, no further escalation of pain meds      Accelerated essential HTN  Due to medical non-compliance  Cont BB   CCB   Increased  clonidine dose from 0.1 12 hourly to 0.2 mg 12 hourly .  BP is better controlled today still uncontrolled .    Systolic and diastolic CHF, chronic    Intake and output monitoring, daily weight  Telemetry monitoring  cont current meds   Cont HD as scheduled        Mild intermittent asthma with active tobacco smoking  Stable without exacerbation   Cont current meds     Recurrent ascites, only perihepatic    Hyperkalemia, refusing HD, discussed his risk of arrhythmia and death        DVT Prophylaxis: Heparin    CODE Status : FULL CODE    Disposition: unclear what really his goals are, very noncompliant and have every reason why he would not do as advsied  Will need to have a plan of approach; will obtain palliative care consult for ACP, and will meet with patient together with staff ie nursing and CM next week    Plan discussed with patient, nursing staff.    Signed by: Jenness Corner, MD  [address]  [place]  Office Ph: [phone]    Please contact at phone number [phone] if any questions.          Note: This  chart was generated by the Epic EMR system/speech recognition and may contain inherent errors or omissions not intended by the user. Grammatical errors, random word insertions, deletions, pronoun errors and incomplete sentences are occasional consequences of this technology due to software limitations. Not all errors are caught or corrected. If there are questions or concerns about the content of this note or information contained within the body of this dictation they should be addressed directly with the author for clarification

## 2016-01-31 NOTE — Progress Notes - NICU (Addendum)
Advance Care Planning Services - [name]      Patient Name: [name], [name] LOS: 5 days   Attending Physician: Jenness Corner, MD   Primary Care Physician: Christa See, MD   Narrative of Meeting [name]   Events prompting this discussion:  Acute hyperkalemia refusing hemodialysis    Participants: patient and attending M.D.    The types of care discussed:  Standard of care for disease    Patient/Surrogate wishes and goals for treatment:  I have discussed about healthcare proxy/POA with the patient. Patient wants his sister [name] to be medical decision maker if patient cant make medical decisions.   Also discussed about long term goals of care given multiple medical co-morbidites (as mentioned above) and code status including CPR/intubation/vasopressors and BiPAP. Patient wants to  [x ] full code    Order placed in EPIC.   He states he can make medical decision. He wants everything done, CPR, intubation.   Active Problem List  Principal Problem:    ESRD needing dialysis  Active Problems:    Clostridium difficile infection       Documents Filled Out As Part of Discussion               [name]   None     The patient has medical decision making capacity.     The patient/surrogate was informed that this session is completely voluntary and was given the opportunity to decline the ACP services. The patient/surrogate made the decision to participate in the ACP session with the discussion proceeding as described above.     Reference:  Detering et al, BMJ 2010; 340 doi: PopPath.it (Published 10 July 2008)  Advance care planning:    Improves end of life care   Improves patient & family satisfaction   Reduces stress, anxiety, & depression in surviving relatives   [phone] - first 30 minutes face-to-face    [phone] - each additional 30 minutes face-to-face    I have spent 16 minutes on face-to-face Advance Care Planning Services   100% of the time was spent  on discussion and counseling the patient.   No active management of the problems listed above was undertaken during the time period reported.            MRN: [id number]           CSN: [id number]  DOB: 1970/12/29   Jenness Corner, MD  01/31/16 5:31 PM   [name]  [address]  [place], [place]  [phone]

## 2016-01-31 NOTE — Plan of Care (Addendum)
Problem: Renal Instability  Goal: Fluid and electrolyte balance are achieved/maintained  Outcome: Not Progressing   01/31/16 2022   Goal/Interventions addressed this shift   Fluid and electrolyte balance are achieved/maintained  Monitor intake and output every shift;Monitor/assess lab values and report abnormal values;Observe for cardiac arrhythmias;Monitor for muscle weakness;Follow fluid restrictions/IV/PO parameters;Monitor daily weight       Comments: Assumed care of patient at 1900. Patient called out during report "nurse, nurse!" Upon RN entering room, patient asked if PRN pain medication was given. Told it was given at 1828. Patient then asked at 1930 if pain medication could be given. Patient again told it was not due, able to be given again at 2130. Patient paged RN at 2000, asking "can you call the doctor to get a one time dose of pain medication for me? Not the one already ordered, an extra dose, so that I can still get it at 9:30." MD notes from today reviewed, spoke to charge RN. Alerted patient that more pain medication was not going to be given. Patient now upset that staff are "letting him lie there and suffer". States no further needs.    8:45 PM  Stool heme +    9:10 PM  Patient upset with staff, demanded to have charge RN, nurse supervisor, and MD come to bedside. Upset that pain is not being managed. Stated "you all have never had cdiff, it hurts. You wouldn't even know. It doesn't matter what history I have" in regards to history of noncompliance and drug seeking/leaving AMA. MD M.Rahman notified. Stated could give PRN dilaudid half and hour early then resume giving q3h. Patient aware no orders were changed. Next dose available at midnight, patient aware.

## 2016-01-31 NOTE — Progress Notes (Signed)
Renal Progress Note    Follow up for:  ESRD    Subjective:     Patient called to dialysis again this morning for urgent HD to address his hyperkalemia but he again refused to go  Seems indifferent to potential harm his nonadherence may cause  Continues to decline antihypertensive medications in spite of counseling regarding risks for CV events and mortality  No new complaints to me      Objective:     Vitals:  BP 185/65   Pulse (!) 101   Temp 98.2 F (36.8 C) (Oral)   Resp 19   Ht 1.854 m (6\' 1" )   Wt 101.5 kg (223 lb 13.6 oz)   SpO2 96%   BMI 29.53 kg/m       Wt Readings from Last 1 Encounters:   01/31/16 0526 101.5 kg (223 lb 13.6 oz)   01/30/16 0524 95.3 kg (210 lb 1.6 oz)   01/29/16 1410 99.7 kg (219 lb 11.2 oz)   01/28/16 0542 96.8 kg (213 lb 4.8 oz)   01/27/16 0445 99.8 kg (220 lb 1.6 oz)   01/26/16 0435 100.9 kg (222 lb 6.4 oz)   01/26/16 0358 100.9 kg (222 lb 6.4 oz)         Intake/Output Summary (Last 24 hours) at 01/31/16 1512  Last data filed at 01/31/16 1408   Gross per 24 hour   Intake              690 ml   Output                0 ml   Net              690 ml       Physical Exam:  General appearance - alert, in no distress  Chest - clear to auscultation, good respiratory effort  Heart - normal rate and regular rhythm, no rub appreciated  Abdomen - soft,  nondistended, bowel sounds normal  Extremities - trace edema  Skin-warm and dry      Labs:  Labs reviewed.  Pertinent findings below.    RECENT LABS (from the last 7 days)  Recent Labs      01/30/16   0533   Hemoglobin  8.6*   Hematocrit  27.4*     No results for input(s): PT, INR, APTT in the last 72 hours.  No results for input(s): TROPI, CK, CKMBINDEX in the last 72 hours.  No results for input(s): BNP in the last 72 hours.      Xr Chest 2 Views    Result Date: 01/31/2016  1.  NO SIGNIFICANT CHANGE FROM 5 DAYS PRIOR. 2.  PERSISTENT SMALL EFFUSIONS AND MILD PULMONARY EDEMA. 3.  DEVELOPING RIGHT LUNG BASE CONSOLIDATION NOT EXCLUDED, THOUGH  FELT DUE TO EDEMA. ReadingStation:[email or web address]     Recent Labs      01/31/16   0557  01/30/16   0533   Glucose  67*  103*   Sodium  136  138   Potassium  6.0*  5.1   Chloride  103  103   CO2  20.6  24.3   BUN  54*  40*   Creatinine  9.03*  7.98*   EGFR  7*  8*   Calcium  7.9*  7.7*     No results for input(s): MG, PHOS in the last 72 hours.  No results for input(s): ALB, PROT, BILITOTAL, ALKPHOS, ALT, AST in the last 72 hours. No results  for input(s): SGUR, LABPH, PROTEINUR, GLUUA, KETONESUA, BILIUA, BLOODUA, NITRITEUA, UROBILIUA, LEUA, NITRITEUA, WBCUA, RBCUA, BACTERIAUA in the last 72 hours.    Invalid input(s):  AMORPHOUSUA                   Assessment:     1. 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 On chronic hemodialysis, sporadically. Most recently on a Monday Wednesday Friday schedule by his report.  2. Nonadherence to prescribed dialysis And antihypertensive treatment regimen  3. Hyperkalemia-associated with missed dialysis treatment. Improved with hemodialysis  4. Metabolic acidosis-associated with missed dialysis treatment. Improved with hemodialysis  5. Volume overload-associated with missed dialysis treatment. Improved with hemodialysis  6. Anemia of ESRD  7. Secondary hyperparathyroidism  8. Hypertension-exacerbated by medication nonadherence as well as volume overload  9. C. difficile diarrhea      Plan:      Dialysis recommended today to address hyperkalemia but patient has refused; counseled regarding potential for dysrhythmias from mounting hyperkalemia, but remained unpersuaded   Continue dialysis on usual MWF schedule.   Maintain renal diet and fluid restrictions   Continue EPO thrice weekly   Continue to encourage adherence to antihypertensive regimen   Continue oral calcitriol (had been on this rather than IV activated vitamin D by review of previous outpatient standing orders)   Await outpatient dialysis chair assignment - case management actively working on this       Orders and  Medications reviewed in Epic.  Case reviewed with Dr. Idamae Lusher, MD  3:12 PM  01/31/2016

## 2016-01-31 NOTE — Plan of Care (Addendum)
Problem: Renal Instability  Goal: Fluid and electrolyte balance are achieved/maintained  Outcome: Progressing   01/30/16 2136   Goal/Interventions addressed this shift   Fluid and electrolyte balance are achieved/maintained  Monitor intake and output every shift;Monitor/assess lab values and report abnormal values;Provide adequate hydration;Monitor daily weight;Assess for confusion/personality changes;Assess and reassess fluid and electrolyte status;Observe for cardiac arrhythmias       Problem: Pain interferes with ability to perform ADL  Goal: Pain at adequate level as identified by patient   01/30/16 2136   Goal/Interventions addressed this shift   Pain at adequate level as identified by patient Identify patient comfort function goal;Assess for risk of opioid induced respiratory depression, including snoring/sleep apnea. Alert healthcare team of risk factors identified.;Reassess pain within 30-60 minutes of any procedure/intervention, per Pain Assessment, Intervention, Reassessment (AIR) Cycle;Evaluate if patient comfort function goal is met;Evaluate patient's satisfaction with pain management progress       Comments: Pt refusing all bp meds until after dialysis.    1323 - In to pt room to transport to dialysis. Pt refusing dialysis. Informed K was 6.0 and potential consequences of this. Offered 2 hr session to get K lowered. Pt still refusing.     1516 - Call to [facility], RRT. Made aware of pt and his condition/non-compliance to have on their radar.

## 2016-01-31 NOTE — Progress Notes (Signed)
Dr. Sandria Manly spoke to pt with the charge nurse present R/T the result of his refusing his medications and HD being eventual death. Dr. Sandria Manly asked the pt if he is trying to kill himself or does he want to die. Pt stated he does not want to die, he just doesn't like the way HD makes him feel. Dr. Sandria Manly informed the pt that if he continues to refuse his medications and HD, he will die. Pt again stated he did not want to die. Dr. Sandria Manly asked the pt who would make medical decisions for him if he was unable to, the pt stated his sister, Newt Minion would, but he was able to say he did not want the HD. Five minutes later the pt told Dr. Sandria Manly that he would do the HD. The HD nurses' have left for the day. Dr. Sandria Manly ordered Kayexalate.. Primary nurse informed RRS of the situation. Charge nurse informed the Nursing Supervisor of the situation.

## 2016-02-01 LAB — BASIC METABOLIC PANEL
Anion Gap: 17 mMol/L (ref 7.0–18.0)
BUN / Creatinine Ratio: 6.6 Ratio — ABNORMAL LOW (ref 10.0–30.0)
BUN: 69 mg/dL — ABNORMAL HIGH (ref 7–22)
CO2: 23.8 mMol/L (ref 20.0–30.0)
Calcium: 7.9 mg/dL — ABNORMAL LOW (ref 8.5–10.5)
Chloride: 103 mMol/L (ref 98–110)
Creatinine: 10.49 mg/dL — ABNORMAL HIGH (ref 0.80–1.30)
EGFR: 6 mL/min/{1.73_m2} — ABNORMAL LOW (ref 60–150)
Glucose: 92 mg/dL (ref 70–99)
Osmolality Calc: 295 mOsm/kg (ref 275–300)
Potassium: 5.8 mMol/L — ABNORMAL HIGH (ref 3.5–5.3)
Sodium: 138 mMol/L (ref 136–147)

## 2016-02-01 LAB — CBC AND DIFFERENTIAL
Basophils %: 0.8 % (ref 0.0–3.0)
Basophils Absolute: 0 10*3/uL (ref 0.0–0.3)
Eosinophils %: 6 % (ref 0.0–7.0)
Eosinophils Absolute: 0.3 10*3/uL (ref 0.0–0.8)
Hematocrit: 26.6 % — ABNORMAL LOW (ref 39.0–52.5)
Hemoglobin: 8.7 gm/dL — ABNORMAL LOW (ref 13.0–17.5)
Lymphocytes Absolute: 0.5 10*3/uL — ABNORMAL LOW (ref 0.6–5.1)
Lymphocytes: 9.6 % — ABNORMAL LOW (ref 15.0–46.0)
MCH: 30 pg (ref 28–35)
MCHC: 33 gm/dL (ref 32–36)
MCV: 93 fL (ref 80–100)
MPV: 7.4 fL (ref 6.0–10.0)
Monocytes Absolute: 0.4 10*3/uL (ref 0.1–1.7)
Monocytes: 7.9 % (ref 3.0–15.0)
Neutrophils %: 75.6 % (ref 42.0–78.0)
Neutrophils Absolute: 4.2 10*3/uL (ref 1.7–8.6)
PLT CT: 134 10*3/uL (ref 130–440)
RBC: 2.85 10*6/uL — ABNORMAL LOW (ref 4.00–5.70)
RDW: 14.8 % — ABNORMAL HIGH (ref 11.0–14.0)
WBC: 5.6 10*3/uL (ref 4.0–11.0)

## 2016-02-01 MED ORDER — SODIUM POLYSTYRENE SULFONATE 15 GM/60ML PO SUSP
15.0000 g | Freq: Once | ORAL | Status: AC
Start: 2016-02-01 — End: 2016-02-01
  Administered 2016-02-01: 15 g via ORAL
  Filled 2016-02-01: qty 60

## 2016-02-01 NOTE — Plan of Care (Addendum)
Problem: Renal Instability  Goal: Fluid and electrolyte balance are achieved/maintained  Outcome: Progressing   02/01/16 2317   Goal/Interventions addressed this shift   Fluid and electrolyte balance are achieved/maintained  Monitor intake and output every shift;Monitor/assess lab values and report abnormal values;Monitor daily weight;Assess and reassess fluid and electrolyte status;Observe for cardiac arrhythmias;Monitor for muscle weakness;Follow fluid restrictions/IV/PO parameters       Comments: Assumed care of patient at 1900. PRN dilaudid given at 2100 for 10/10 abdominal pain. Patient requesting repeatedly for "an extra dose, an early dose, or increase the dilaudid to 1mg  every 4hrs or something". Patient offered PRN percocet. Patient stated "I can't take that, it makes me feel bad. So does morphine", continuing to request more dilaudid. Stating things like, "these doctors have never had cdiff, they don't get it". Attempted to talk to patient about other issues with non compliance and history leaving AMA, but patient will have no part of it. PRN zofran given for nausea. Patient denies further needs. Will monitor.    7:15 AM  Patient called frequently through out night for PRN dilaudid, requesting it be given early, dose increased, call MD for extra one time dose. Patient even asked RN to call MD and request "1mg  dilaudid, you can even increase the time to every 4hrs as needed". Patient told that MD did not want to escalate pain medications. Patient would fall asleep during conversation or sitting on edge of bed, falling back onto bed frequently this shift. Patient in no apparent distress at this time, despite rating pain 10/10. Report given to day shift RN.

## 2016-02-01 NOTE — Plan of Care (Signed)
Problem: Renal Instability  Goal: Fluid and electrolyte balance are achieved/maintained  Outcome: Progressing   02/01/16 1423   Goal/Interventions addressed this shift   Fluid and electrolyte balance are achieved/maintained  Monitor intake and output every shift;Monitor/assess lab values and report abnormal values;Provide adequate hydration;Monitor daily weight;Assess for confusion/personality changes;Assess and reassess fluid and electrolyte status;Observe for seizure activity and initiate seizure precautions if indicated;Monitor for muscle weakness;Follow fluid restrictions/IV/PO parameters

## 2016-02-01 NOTE — Progress Notes (Signed)
Date Time: 02/01/16 4:16 PM  Patient Name: [name], [name]       CSN: [id number]  Attending Physician: Jenness Corner, MD  Primary Care Provider: Christa See, MD    Interim summary [facility])     45 y.o. M with PMH of , ESRD  on hemodialysis over the past 7 years,  NON COMOPLIANT TO TRAETMENT,  anemia,   chronic systolic and diastolic congestive heart failure with LVEF of 35-40% and grade 2 diastolic dysfunction by 2-D echo in August 2017, HTN, mild intermittent asthma, tobacco dependence via cigarette smoking,WAS ADMITTED WITH     SOB AND CDAD .    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  Nephrology consulted ; started on HD   Pt has perma cath .  ID consulted         SUBJECTIVE:     CC: complaints of abdominal distention    Reports blood in stooll, staff reports noted a drop of bright red blood  Still having abdominal pain, sitting at the edge of the bed for relief  No hematemesis  PHYSICAL EXAM:   Temp:  [97.6 F (36.4 C)-98.4 F (36.9 C)] 98.3 F (36.8 C)  Heart Rate:  [97-103] 103  Resp Rate:  [18-19] 19  BP: (137-189)/(76-95) 137/76  Body mass index is 29.84 kg/m.    Intake/Output Summary (Last 24 hours) at 02/01/16 1616  Last data filed at 02/01/16 0900   Gross per 24 hour   Intake              450 ml   Output                0 ml   Net              450 ml     Weight Monitoring 01/27/2016 01/28/2016 01/29/2016 01/29/2016 01/30/2016 01/31/2016 02/01/2016   Height - - - - - - -   Height Method - - - - - - -   Weight 99.837 kg 96.752 kg (No Data) 99.655 kg 95.301 kg 101.538 kg 102.604 kg   Weight Method Actual Standing Scale - Standing Scale Actual Standing Scale Standing Scale   BMI (calculated) - - - - - - -         Exam:   General: moderately built, no acute distress ,  Maintained on O2 per NC  Psychiatry: Patient is awake, alert and oriented x 3, mood is appropriate   Chest: CTA bilaterally. No  crackles or wheezing. No accessory muscle use   CVS: S1, S2 normal, RRR, no thrill   Abdomen: soft and firm, nontender,no rebound or guarding, hypoactiv eBS  Musculoskeletal: No pitting edema, no cyanosis/clubbing. Expected ROM all 4 extremities. No joint swelling     MEDS: (SCHEDULED/INFUSIONS/PRN)     Current Facility-Administered Medications   Medication Dose Route Frequency   . albuterol-ipratropium  2 puff Inhalation Q6H   . amLODIPine  10 mg Oral Daily   . calcitRIOL  0.5 mcg Oral Mon-Wed-Fri   . carvedilol  25 mg Oral Q12H SCH   . dicyclomine  20 mg Oral QID   . famotidine  20 mg Oral Daily   . furosemide  80 mg Oral Daily   . heparin (porcine)  5,000 Units Subcutaneous Q12H [facility]   . hydrALAZINE  100 mg Oral Q8H SCH   . nitroglycerin  1 inch Topical Q6H  HOLD MN   . sevelamer  800 mg Oral TID MEALS   . vancomycin  500 mg Oral 4 times per day     Current Facility-Administered Medications   Medication Dose Route Frequency Last Rate     Current Facility-Administered Medications   Medication Dose Route   . acetaminophen  650 mg Oral    Or   . acetaminophen  650 mg per NG tube    Or   . acetaminophen  650 mg Rectal   . albuterol  2 puff Inhalation   . albuterol  2.5 mg Nebulization   . heparin (porcine)  1,000-3,000 Units Intracatheter   . hydrALAZINE  10 mg Intravenous   . HYDROmorphone  0.5 mg Intravenous   . naloxone  0.4 mg Intravenous   . ondansetron  8 mg Oral    Or   . ondansetron  4 mg Intravenous   . oxyCODONE-acetaminophen  1 tablet Oral   . prochlorperazine  10 mg Intravenous         LABS:       Recent Labs  Lab 02/01/16  0530 01/30/16  0533 01/28/16  1133   WBC 5.6  --  4.7   RBC 2.85*  --  2.74*   Hemoglobin 8.7* 8.6* 8.6*   Hematocrit 26.6* 27.4* 25.8*   MCV 93  --  94   PLT CT 134  --  133         Recent Labs  Lab 02/01/16  0530 01/31/16  0557   Sodium 138 136   Potassium 5.8* 6.0*   Chloride 103 103   CO2 23.8 20.6   BUN 69* 54*   Creatinine 10.49* 9.03*   Glucose 92 67*   EGFR 6* 7*   Calcium 7.9*  7.9*               Recent Labs  Lab 01/26/16  0000   ALT 8   AST (SGOT) 15   Bilirubin, Total 0.6   Albumin 2.6*   Alkaline Phosphatase 218*                    No results found for: [id number]      IMAGING STUDIES:   Ct Abdomen Pelvis Dry  (stone)    Result Date: 01/26/2016  Volume overload with cardiomegaly, ascites, edema, anasarca. Complex ascites. ReadingStation:[email or web address]    Xr Chest 2 Views    Result Date: 01/31/2016  1.  NO SIGNIFICANT CHANGE FROM 5 DAYS PRIOR. 2.  PERSISTENT SMALL EFFUSIONS AND MILD PULMONARY EDEMA. 3.  DEVELOPING RIGHT LUNG BASE CONSOLIDATION NOT EXCLUDED, THOUGH FELT DUE TO EDEMA. ReadingStation:[email or web address]    Nm Lung Ventilation Perfusion Aerosol    Result Date: 01/08/2016   Low probability for pulmonary embolism. Collene Schlichter, MD 01/08/2016 9:32 AM     US Abdomen Limited Ruq    Result Date: 01/29/2016  1. Heterogeneous liver parenchyma likely related to underlying nonspecific medical liver disease. 2. Small volume of abdominal ascites with the largest pocket of fluid in the right upper quadrant. 3. Gallbladder absent. 4. Atrophic echogenic right kidney corresponding to chronic medical renal disease. ReadingStation:[email or web address]    Xr Chest Ap Portable    Result Date: 01/26/2016  Left IJ double lumen dialysis catheter tip at superior cavoatrial junction. Right subclavian vascular stent. Cardiomegaly. Mild pulmonary edema. No focal consolidation or suspicious infiltrates. Trace pleural effusions. ReadingStation:[email or web address]    US Venous Duplex Doppler Leg Bilateral    Result Date: 01/07/2016  No evidence for DVT. Charlott Rakes, MD 01/07/2016 2:43 PM     Xr Abdomen Portable    Result Date: 01/30/2016  FINDINGS/IMPRESSION: 1.  PAUCITY OF BOWEL GAS SEEN; CONSISTENT WITH FLUID-FILLED LARGE AND SMALL BOWEL LOOPS AS ON PRIOR CT. 2.  HAZINESS CONSISTENT WITH ASCITES. 3.  NO ABNORMAL ABDOMINAL CALCIFICATIONS. 4.  NO ACUTE BONY ABNORMALITY. ReadingStation:[email or web address]    US Abdomen Limited Single  Organ    Result Date: 01/05/2016   Small to moderate volume of abdominal and pelvic ascites. Findings appear similar to that seen on 11/10/2015 CT, accounting for differences in technique Laurena Slimmer, MD 01/05/2016 8:30 AM       MICROBIOLOGY AND/OR TELEMETRY:       Stool + c diff     ASSESSMENT AND PLAN:     SOB sec to   Volume overload from missed hemodialysis in patient with ESRD on HD   Due to medical non-compliance;  Nephrology following        Recurrent Clostridium difficile diarrhea  States he has diarrhea but not witnessed      Accelerated essential HTN  Due to medical non-compliance  Cont BB   CCB   Increased  clonidine dose from 0.1 12 hourly to 0.2 mg 12 hourly .  BP is better controlled today still uncontrolled .    Systolic and diastolic CHF, chronic    Intake and output monitoring, daily weight  Telemetry monitoring  cont current meds   Cont HD as scheduled        Mild intermittent asthma with active tobacco smoking  Stable without exacerbation   Cont current meds     Recurrent ascites, only perihepatic, repeat limited US    Hyperkalemia, refusing HD, discussed his risk of arrhythmia and death    Hematochezia, continue monitoring HH        DVT Prophylaxis: Heparin    CODE Status : FULL CODE    Disposition: not ready for dischage    Plan discussed with patient, nursing staff.    Signed by: Jenness Corner, MD  [address]  [place]  Office Ph: [phone]    Please contact at phone number [phone] if any questions.          Note: This chart was generated by the Epic EMR system/speech recognition and may contain inherent errors or omissions not intended by the user. Grammatical errors, random word insertions, deletions, pronoun errors and incomplete sentences are occasional consequences of this technology due to software limitations. Not all errors are caught or corrected. If there are questions or concerns about the content of this note or information contained within the body of this  dictation they should be addressed directly with the author for clarification

## 2016-02-01 NOTE — Progress Notes (Signed)
Renal Progress Note    Follow up for:  ESRD    Subjective:     No new complaints to me    ROS: no SOB, CP, F/C, N/V/D, pruritis.    Objective:     Vitals:  BP 137/76   Pulse (!) 103   Temp 97.6 F (36.4 C) (Oral)   Resp 18   Ht 1.854 m (6\' 1" )   Wt 102.6 kg (226 lb 3.2 oz)   SpO2 100%   BMI 29.84 kg/m       Wt Readings from Last 1 Encounters:   02/01/16 0500 102.6 kg (226 lb 3.2 oz)   01/31/16 0526 101.5 kg (223 lb 13.6 oz)   01/30/16 0524 95.3 kg (210 lb 1.6 oz)   01/29/16 1410 99.7 kg (219 lb 11.2 oz)   01/28/16 0542 96.8 kg (213 lb 4.8 oz)   01/27/16 0445 99.8 kg (220 lb 1.6 oz)   01/26/16 0435 100.9 kg (222 lb 6.4 oz)   01/26/16 0358 100.9 kg (222 lb 6.4 oz)         Intake/Output Summary (Last 24 hours) at 02/01/16 1235  Last data filed at 02/01/16 0900   Gross per 24 hour   Intake              690 ml   Output                0 ml   Net              690 ml       Physical Exam:  General appearance - alert, in no distress  Chest - clear to auscultation, good respiratory effort  Heart - normal rate and regular rhythm, no rub appreciated  Abdomen - soft,  nondistended, bowel sounds normal  Extremities - trace edema  Skin-warm and dry      Labs:  Labs reviewed.  Pertinent findings below.    RECENT LABS (from the last 7 days)  Recent Labs      02/01/16   0530  01/30/16   0533   WBC  5.6   --    Hemoglobin  8.7*  8.6*   Hematocrit  26.6*  27.4*   PLT CT  134   --      No results for input(s): PT, INR, APTT in the last 72 hours.  No results for input(s): TROPI, CK, CKMBINDEX in the last 72 hours.  No results for input(s): BNP in the last 72 hours.      No results found.   Recent Labs      02/01/16   0530  01/31/16   0557  01/30/16   0533   Glucose  92  67*  103*   Sodium  138  136  138   Potassium  5.8*  6.0*  5.1   Chloride  103  103  103   CO2  23.8  20.6  24.3   BUN  69*  54*  40*   Creatinine  10.49*  9.03*  7.98*   EGFR  6*  7*  8*   Calcium  7.9*  7.9*  7.7*     No results for input(s): MG, PHOS in the  last 72 hours.  No results for input(s): ALB, PROT, BILITOTAL, ALKPHOS, ALT, AST in the last 72 hours. No results for input(s): SGUR, LABPH, PROTEINUR, GLUUA, KETONESUA, BILIUA, BLOODUA, NITRITEUA, UROBILIUA, LEUA, NITRITEUA, WBCUA, RBCUA, BACTERIAUA in the last 72 hours.  Invalid input(s):  AMORPHOUSUA                   Assessment:     1. 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 On chronic hemodialysis, sporadically. Most recently on a Monday Wednesday Friday schedule by his report.  2. Nonadherence to prescribed dialysis And antihypertensive treatment regimen  3. Hyperkalemia-associated with missed dialysis treatment. Improved with hemodialysis  4. Metabolic acidosis-associated with missed dialysis treatment. Improved with hemodialysis  5. Volume overload-associated with missed dialysis treatment. Improved with hemodialysis  6. Anemia of ESRD  7. Secondary hyperparathyroidism  8. Hypertension-exacerbated by medication nonadherence as well as volume overload  9. C. difficile diarrhea      Plan:      No urgent need for HD today; patient urged to maintain adherence to prescribed regimen to prevent acute and chronic metabolic sequelae   Continue dialysis on usual MWF schedule.   Maintain renal diet and fluid restrictions   Continue EPO thrice weekly   Continue to encourage adherence to antihypertensive regimen   Continue oral calcitriol (had been on this rather than IV activated vitamin D by review of previous outpatient standing orders)   Await outpatient dialysis chair assignment - case management actively working on this       Orders and Medications reviewed in Epic.    Cheral Marker, MD  12:35 PM  02/01/2016

## 2016-02-02 ENCOUNTER — Inpatient Hospital Stay: Payer: Medicare Other

## 2016-02-02 LAB — BASIC METABOLIC PANEL
Anion Gap: 19.3 mMol/L — ABNORMAL HIGH (ref 7.0–18.0)
BUN / Creatinine Ratio: 6.7 Ratio — ABNORMAL LOW (ref 10.0–30.0)
BUN: 79 mg/dL — ABNORMAL HIGH (ref 7–22)
CO2: 21.1 mMol/L (ref 20.0–30.0)
Calcium: 7.2 mg/dL — ABNORMAL LOW (ref 8.5–10.5)
Chloride: 104 mMol/L (ref 98–110)
Creatinine: 11.75 mg/dL — ABNORMAL HIGH (ref 0.80–1.30)
EGFR: 5 mL/min/{1.73_m2} — ABNORMAL LOW (ref 60–150)
Glucose: 107 mg/dL — ABNORMAL HIGH (ref 70–99)
Osmolality Calc: 302 mOsm/kg — ABNORMAL HIGH (ref 275–300)
Potassium: 5.4 mMol/L — ABNORMAL HIGH (ref 3.5–5.3)
Sodium: 139 mMol/L (ref 136–147)

## 2016-02-02 LAB — HEMOGLOBIN AND HEMATOCRIT, BLOOD
Hematocrit: 25.7 % — ABNORMAL LOW (ref 39.0–52.5)
Hemoglobin: 8.4 gm/dL — ABNORMAL LOW (ref 13.0–17.5)

## 2016-02-02 MED ORDER — HEPARIN SODIUM (PORCINE) 1000 UNIT/ML IJ SOLN
INTRAMUSCULAR | Status: AC
Start: 2016-02-02 — End: ?
  Filled 2016-02-02: qty 10

## 2016-02-02 NOTE — Progress Notes (Addendum)
INFECTIOUS DISEASE   PROGRESS NOTE    Date Time: 02/02/16 1:09 PM  Patient Name: [name], [name]    Assessment:     1. 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 ESRD - on HD  3. Medical noncompliance - documented in the chart  4. Hyperkalemia episodes with missed dialysis sessions  5. HTN  6. History c diff 02/2015, 09/2015, and 01/25/2016  7. asthma      Plan:     1. Vancomycin 500mg  po qid x 7 days , then 250mg  qid x 14 days, then 250mg   tid x 14 days, then 250mg  bid x 14 days  2.  If failure to respond to <5 stools/day in the next one week - refer to GI for consideration fecal transplant     Subjective:       Stools not recorded -  So unsure of frequency  Lot of complaints today  Feels lousy   neck pain earlier and says still with diarrhea    Going for ct neck    Antibiotics:     Current Facility-Administered Medications   Medication Dose Route Frequency   . albuterol-ipratropium  2 puff Inhalation Q6H   . amLODIPine  10 mg Oral Daily   . calcitRIOL  0.5 mcg Oral Mon-Wed-Fri   . carvedilol  25 mg Oral Q12H SCH   . dicyclomine  20 mg Oral QID   . famotidine  20 mg Oral Daily   . furosemide  80 mg Oral Daily   . heparin (porcine)  5,000 Units Subcutaneous Q12H [facility]   . hydrALAZINE  100 mg Oral Q8H SCH   . nitroglycerin  1 inch Topical Q6H HOLD MN   . sevelamer  800 mg Oral TID MEALS   . vancomycin  500 mg Oral 4 times per day        Lines:               Permacath/Temporary Catheter Permacath Left (Active)   Line necessity reviewed? Dialysis 02/01/2016  8:00 PM   Catheter Lumen Volume Venous 1.9 mL 01/28/2016  1:50 PM   Catheter Lumen Volume Arterial 1.8 mL 01/28/2016  1:50 PM   Dressing Status and Intervention Dressing Intact 02/01/2016  8:00 PM   Dressing Type Antimicrobial impregnated 02/01/2016  8:00 PM   Dressing Status Clean;Dry;Intact 02/01/2016  8:00 PM   Dressing Change Due 02/02/16 02/01/2016  7:52 AM          Physical Exam:     Temp:  [97.7 F (36.5 C)-98.3 F (36.8 C)] 97.7 F (36.5  C)  Heart Rate:  [93-98] 98  Resp Rate:  [18-19] 18  BP: (156-184)/(78-105) 184/93    No fever  Going to radiology for scan now  Labs/Microbiology:     Results     Procedure Component Value Units Date/Time    Basic Metabolic Panel [[id number]]  (Abnormal) Collected:  02/02/16 0453    Specimen:  Plasma Updated:  02/02/16 0535     Sodium 139 mMol/L      Potassium 5.4 (H) mMol/L      Chloride 104 mMol/L      CO2 21.1 mMol/L      Calcium 7.2 (L) mg/dL      Glucose 409 (H) mg/dL      Creatinine 81.19 (H) mg/dL      BUN 79 (H) mg/dL      Anion Gap 14.7 (H) mMol/L  BUN/Creatinine Ratio 6.7 (L) Ratio      EGFR 5 (L) mL/min/1.60m2      Osmolality Calc 302 (H) mOsm/kg     Hemoglobin and hematocrit, blood [[id number]]  (Abnormal) Collected:  02/02/16 0453    Specimen:  Blood Updated:  02/02/16 0526     Hemoglobin 8.4 (L) gm/dL      Hematocrit 40.9 (L) %           Rads:     Radiology Results (24 Hour)     Procedure Component Value Units Date/Time    US ABDOMEN/PELVIS ART/VEIN VISCERAL DUPLEX DOPP COMP [[id number]] Collected:  01/27/16 9562    Order Status:  Completed Updated:  02/02/16 1241    Narrative:                                                                                         [facility]                                                                                  [address]                                                                                  [place], [place] [address]                                                                                  [phone]       Mesenteric Artery Report    Name:  [name], [name]    Age:  45       Gender:  M     Exam Date:    01/27/2016 09:10        Ordering Physician:RAHMAN, [id number]      DOB:   1970/06/27             Exam Location:[facility]            Attending Physician:    MRN:   [id number]               Ht (in):    Wt (lb):    BSA (m2):     Technologist:      Betsy Pries RVT  Indications:    r/o mesenteric veins thrombosis / vascular  stenosis    History:        HTN, kidney failure, current smoker    Procedure Performed: A non-invasive vascular duplex imaging study of the visceral vessels was performed using two-                         dimensional evaluation, color flow and spectral Doppler analysis.       Type of Exam:   [place] ABD/PELVIS ART/VEIN VISCERAL DUPLEX DOPP COMP       Superior Mesenteric [phone]    Celiac Artery            94.6    Inferior Mesenteric Artery    Hepatic Artery    Splenic Artery    Aorta                    63.8    FINDINGS    Difficult exam, patient cannot tolerate probe pressure on his abdomen.     Patent celiac and superior mesenteric arteries with normal Doppler signals.      The IMA was not identified.    CONCLUSIONS    No evidence of significant messenteric arterial disease by this duplex.               Brayton Caves MD     (Electronically Signed)     Final Date:02 February 2016 12:41     US ABDOMEN LIMITED SINGLE ORGAN [[id number]] Collected:  02/02/16 1013    Order Status:  Completed Updated:  02/02/16 1016    Narrative:       Clinical History:  Reason For Exam: ascites; Acies  Ascites.    Examination:  US ABDOMEN LIMITED SINGLE ORGAN    Comparison:  Abdominal ultrasound 4 days prior      Impression:       FINDINGS/IMPRESSION:  LARGE ASCITES; LARGEST POCKET RLQ.    ReadingStation:[id number]          Signed by: Jefferson Fuel, MD

## 2016-02-02 NOTE — Plan of Care (Signed)
Problem: Renal Instability  Goal: Fluid and electrolyte balance are achieved/maintained  Outcome: Progressing   02/02/16 1452   Goal/Interventions addressed this shift   Fluid and electrolyte balance are achieved/maintained  Monitor intake and output every shift;Monitor/assess lab values and report abnormal values;Provide adequate hydration;Assess for confusion/personality changes;Assess and reassess fluid and electrolyte status;Observe for seizure activity and initiate seizure precautions if indicated;Observe for cardiac arrhythmias;Monitor for muscle weakness

## 2016-02-02 NOTE — Progress Notes (Signed)
Dialysis Treatment Note    Patient:  [name] MRN#:  [id number]  Unit/Room/Bed:  811/914-N   Isolation: Contact Special       Allergies: Allergies   Allergen Reactions   . Lisinopril Anaphylaxis   . Toradol [Ketorolac Tromethamine] Anaphylaxis   . Morphine Other (See Comments)     "jittery" per patient, "I don't like the way it makes me feel". Previously charted anaphylaxis, but patient said just "jumpy, jittery"   . Percocet [Oxycodone-Acetaminophen] Other (See Comments)     "jittery" per patient, "I don't like the way it makes me feel"     Code Status: Full Code    Problem List:   Patient Active Problem List    Diagnosis Date Noted   . ESRD needing dialysis 01/26/2016   . Clostridium difficile infection 01/26/2016   . Cardiomyopathy    . Cardiac LV ejection fraction 21-40% 01/07/2016   . Chest pain, unspecified type 01/06/2016   . Chest pain in adult 12/30/2015   . Acute on chronic congestive heart failure 12/30/2015   . Drug-seeking behavior 12/18/2015   . HAP (hospital-acquired pneumonia) 12/17/2015   . Diarrhea 12/17/2015   . Hospital-acquired pneumonia    . Nausea & vomiting 11/09/2015   . Decreased appetite 11/09/2015   . Ascites 11/09/2015   . Diarrhea of infectious origin 11/09/2015   . Acute pain due to trauma 11/17/2013   . Pulmonary contusion, initial encounter 11/17/2013   . 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 Hypertension 11/17/2013       Dialysis Order:  Active Orders   Dialysis    Hemodialysis inpatient     Frequency: Once     Start Date/Time: 02/02/16 0000     Number of Occurrences:  1 Occurrences     Order Questions:     . Date of Dialysis 02/02/2016     . K+ (initial) 2 mEq     . KPP (Per Protocol) Call MD     . Ca++ 2.5 mEq     . Bicarb 35 mEq     . Na+ 138 mEq     . Dialyzer ReviClear     . Dialysate Temperature (C) 37     . BFR-As tolerated to a maximum of: 400 mL/min     . DFR 600 mL/min     . Duration of Treatment 3.5 Hours     . Fluid Removal (L) and Dry Weight (Kg)  Dry Weight (please specify) (Comment: 97 kg)     . Limit Ultrafiltration Rate Yes     . Specify mL/hr/kg 13     . Access Type-Location Tunneled-L    Hemodialysis inpatient     Frequency: Once     Start Date/Time: 02/04/16 0000     Number of Occurrences:  1 Occurrences     Order Questions:     . Date of Dialysis 02/04/2016     . K+ (initial) 2 mEq     . KPP (Per Protocol) Call MD     . Ca++ 2.5 mEq     . Bicarb 35 mEq     . Na+ 138 mEq     . Dialyzer ReviClear     . Dialysate Temperature (C) 37     . BFR-As tolerated to a maximum of: 400 mL/min     . DFR 600 mL/min     . Duration of Treatment 3.5 Hours     . Fluid Removal (L)  and Dry Weight (Kg) Dry Weight (please specify) (Comment: 97 kg)     . Limit Ultrafiltration Rate Yes     . Specify mL/hr/kg 13     . Access Type-Location Tunneled-L        Dialysis Treatment Type:    Time out/Safety Check: Time Out/Safety Check Completed: Yes (02/02/16 1350)  [facility] Consent: Consent for HD signed for this hospitalization: Yes (02/02/16 1350)  Blood Consent (if applicable): Blood Consent Verified: Not Applicable (01/28/16 4259)    Dialysis Access:  Permacath/Temporary Catheter Permacath Left (Active)   Line necessity reviewed? Dialysis 02/02/2016  2:06 PM   Catheter Lumen Volume Venous 1.9 mL 02/02/2016  5:50 PM   Catheter Lumen Volume Arterial 1.9 mL 02/02/2016  5:50 PM   Dressing Status and Intervention New Dressing 02/02/2016  5:50 PM   Tego Caps on Catheter Yes 02/02/2016  5:50 PM   NEW Tego Caps placed (Date) 02/02/16 02/02/2016  5:50 PM   Dressing Type Antimicrobial impregnated 02/01/2016  8:00 PM   Dressing Status Clean;Dry;Intact 02/01/2016  8:00 PM   Dressing Change Due 02/02/16 02/01/2016  7:52 AM           General Assessments:       02/02/16 1350   Bedside Nurse Communication   Name of bedside RN - pre dialysis Arlys John   Treatment Initiation- With Dialysis Precautions   Time Out/Safety Check Completed Yes   Consent for HD signed for this hospitalization Y    Dialysis Precautions All Connections Secured;Saline Line Double Clamped;Venous Parameters Set;Arterial Parameters Set;Air Foam Detecctor Engaged   Dialysis Treatment Type Routine;Acute Room   RO/Hemodialysis Machine Safety Checks   Is Total Chlorine less than 0.1 ppm? Yes   Orignial Total Chlorine Testing Time 0800   At 4 Hour Total Chlorine Testing Time 1130   RO/Hemodialysis Machine Safety Checks   Machine Number 29   RO # [id number]   pH 7.2   Pressure Test Verified Yes   Alarms Verified Passed   Machine Temperature 98.6 F (37 C)   Alarms Verified Yes   Na+ mEq (Machine) 138 mEq   Bicarb mEq (Machine) 37 mEq   Hemodialysis Conductivity (Machine) 13.7   Hemodialysis Conductivity (Meter) 13.5   Dialyzer Lot Number [id number]   Tubing Lot Number [id number]   RO Machine Log Completed Yes   Hepatitis Status   HBsAg (Antigen) Result Negative   HBsAg Date Drawn 01/26/16   Dialysis Weight   Pre-Treatment Weight (Kg) 102.6   Scale Type Floor Scale   Vitals   Temp 97.7 F (36.5 C)   Heart Rate 98   Resp Rate 18   BP (!) 177/109   Assessment   Mental Status Alert;Oriented   Cardiac (WDL) WDL   Respiratory  X  (N/C 4 L)   Bilateral Breath Sounds Diminished   Edema  X   Generalized Edema (generlized non pitting)   General Skin Color Appropriate for ethnicity   Permacath/Temporary Catheter Permacath Left   No Placement Date or Time found.   Access Type: Permacath  Orientation: Left  Access Location: Tunneled, Chest  Central Line Infection Prevention Education provided?: Yes   Dressing Status and Intervention New Dressing   Pain Assessment   Charting Type Assessment   Hemodialysis Comments   Pre-Hemodialysis Comments pt arrived by bed on 4L N/C        02/02/16 1750   Treatment Summary   Time Off Machine 1740   Duration of Treatment (Hours) 3.5  Dialyzer Clearance Clear   Fluid Volume Off (mL) 5000   Prime Volume (mL) 250   Rinseback Volume (mL) 250   Fluid Given: Normal Saline (mL) 0   Fluid Given: PRBC  0 mL   Fluid Given:  Albumin (mL) 0   Fluid Given: Other (mL) 0   Total Fluid Given 500   Hemodialysis Net Fluid Removed 4500   Post Treatment Assessment   Post-Treatment Weight (Kg) 98.1   Patient Response to Treatment pt toklerated tx well, experienced leg crams   Permacath/Temporary Catheter Permacath Left   No Placement Date or Time found.   Access Type: Permacath  Orientation: Left  Access Location: Tunneled, Chest  Central Line Infection Prevention Education provided?: Yes   Catheter Lumen Volume Venous 1.9 mL   Catheter Lumen Volume Arterial 1.9 mL   Dressing Status and Intervention New Dressing   Tego Caps on Catheter Yes   NEW Tego Caps placed (Date) 02/02/16   Vitals   Resp Rate 18   BP (!) 179/117   Assessment   Mental Status Alert;Oriented   Cardiac (WDL) WDL   Respiratory  WDL   Respiratory Pattern (4LN/C)   Edema  (generalized)   Education   Person taught Patient   Knowledge basis Minimal   Topics taught Fluid Management;Procedure   Teaching Tools Explain   Reponse Needs Reinforcement   Bedside Nurse Communication   Name of bedside RN - post dialysis Arlys John           Pain Assessment: Pain Assessment  Charting Type: Assessment (02/02/16 1350)  GWN Pain Level (Read Only): 9 (02/02/16 0330)  Pain Scale Used: Numeric Scale (0-10) (02/02/16 1457)    Labs Values:    HBsAg Result:HBsAg (Antigen) Result: Negative (02/02/16 1350)  HBsAg Date Drawn: HBsAg Date Drawn: 01/26/16 (02/02/16 1350)  HBsAg Next Due Date:     Lab Results   Component Value Date    BUN 79 (H) 02/02/2016    NA 139 02/02/2016    K 5.4 (H) 02/02/2016    CREAT 11.75 (H) 02/02/2016    CO2 21.1 02/02/2016    CA 7.2 (L) 02/02/2016    PHOS 5.8 (H) 01/06/2016    GLU 107 (H) 02/02/2016    ALB 2.6 (L) 01/26/2016    HGB 8.4 (L) 02/02/2016    HCT 25.7 (L) 02/02/2016    WBC 5.6 02/01/2016    PLT 134 02/01/2016    PTT 40 (H) 11/10/2015    PT 17.4 (H) 11/12/2015    INR 1.4 (H) 11/12/2015         Current Diet Order      Diet renal 80 GM Protein (NO PROTEIN RESTRICTION); NO  ADDED SALT; 1500 ML FLUID; (LESS THAN 65 meq POTASSIUM AND LESS THAN 800 meq OF PHOSPHOROUS PER DAY)     RO/Hemodialysis Machine Safety Checks - Before Each Treatment:  RO/Hemodialysis Dance movement psychotherapist Number: 29 (02/02/16 1350)  RO #: [id number] (02/02/16 1350)  Water Hardness: 0 (01/28/16 0838)  pH: 7.2 (02/02/16 1350)  Pressure Test Verified: Yes (02/02/16 1350)  Alarms Verified: Passed (02/02/16 1350)  Machine Temperature: 98.6 F (37 C) (02/02/16 1350)  Alarms Verified: Yes (02/02/16 1350)  Na+ mEq (Machine): 138 mEq (02/02/16 1350)  Bicarb mEq (Machine): 37 mEq (02/02/16 1350)  Hemodialysis Conductivity (Machine): 13.7 (02/02/16 1350)  Hemodialysis Conductivity (Meter): 13.5 (02/02/16 1350)  Dialyzer Lot Number: [id number] (02/02/16 1350)  Tubing Lot Number: [id number] (02/02/16 1350)  RO Machine Log Completed: Yes (  02/02/16 1350)    Chlorine Testing:  RO/Hemodialysis Machine Safety Checks  Is Total Chlorine less than 0.1 ppm?: Yes (02/02/16 1350)  Orignial Total Chlorine Testing Time: 0800 (02/02/16 1350)  At 4 Hour Total Chlorine Testing Time: 1130 (02/02/16 1350)    Vitals and Weight:  Patient Vitals for the past 4 hrs:   BP Temp Pulse Resp   02/02/16 1750 (!) 179/117 - (!) 114 18   02/02/16 1740 (!) 179/117 - (!) 106 18   02/02/16 1730 (!) 170/131 - (!) 105 18   02/02/16 1700 (!) 195/116 - (!) 103 18   02/02/16 1630 (!) 166/123 - (!) 104 18   02/02/16 1600 (!) 183/112 - (!) 106 18   02/02/16 1500 (!) 176/132 - (!) 108 18   02/02/16 1430 (!) 188/119 - (!) 102 18   02/02/16 1406 (!) 177/104 97.7 F (36.5 C) 97 18        Dialysis Weight  Pre-Treatment Weight (Kg): 102.6 (02/02/16 1350)  Scale Type: Floor Scale (02/02/16 1350)  Post-Treatment Weight (Kg): 98.1 (02/02/16 1750)    Treatment:     02/02/16 1406 02/02/16 1430 02/02/16 1500   Vitals   Temp 97.7 F (36.5 C) --  --    Heart Rate 97 (!) 102 (!) 108   Resp Rate 18 18 18    BP (!) 177/104 (!) 188/119 (!) 176/132   Chief Technology Officer Yes --  --    Blood Flow Rate (mL/min) 400 mL/min 400 mL/min 400 mL/min   Arterial Pressure (mmHg) -240 mmHg -240 mmHg -240 mmHg   Venous Pressure (mmHg) 160 160 160   Dialysate Flow Rate (mL/min) 600 mL/min 600 mL/min 600 mL/min   Transmembrane Pressure (mmHg) 60 mmHg 60 mmHg 60 mmHg   Ultrafiltration Rate (mL/Hr) 1590 mL/hr 1590 mL/hr 1590 mL/hr   Fluid Removal (ml) --  700 ml 1410 ml   Dialysate K (mEq/L) 2 mEq/L --  --    Dialysate CA (mEq/L) 2.5 mEq/L --  --    Hemodialysis Comments   Arteriovenous Lines Secure Yes --  Yes   Comments initiated tx no complications --  pt stable       02/02/16 1600 02/02/16 1630 02/02/16 1700   Vitals   Temp --  --  --    Heart Rate (!) 106 (!) 104 (!) 103   Resp Rate 18 18 18    BP (!) 183/112 (!) 166/123 (!) 195/116   Animal nutritionist --  --  --    Blood Flow Rate (mL/min) 400 mL/min 400 mL/min 400 mL/min   Arterial Pressure (mmHg) -240 mmHg -240 mmHg -240 mmHg   Venous Pressure (mmHg) 160 160 160   Dialysate Flow Rate (mL/min) 600 mL/min 600 mL/min 600 mL/min   Transmembrane Pressure (mmHg) 60 mmHg 60 mmHg 60 mmHg   Ultrafiltration Rate (mL/Hr) 980 mL/hr 980 mL/hr 980 mL/hr   Fluid Removal (ml) 2940 ml 3720 ml 4310 ml   Dialysate K (mEq/L) --  --  --    Dialysate CA (mEq/L) --  --  --    Hemodialysis Comments   Arteriovenous Lines Secure Yes Yes Yes   Comments pt having cramps in R leg, demanded to lower UF, Lowered to 5000, complaing of 10/10 pain in belly, RN notified stable pt stable       02/02/16 1730 02/02/16 1740   Vitals   Temp --  --    Heart  Rate (!) 105 (!) 106   Resp Rate 18 18   BP (!) 170/131 (!) 179/117   Animal nutritionist --  --    Blood Flow Rate (mL/min) 400 mL/min 400 mL/min   Arterial Pressure (mmHg) -240 mmHg -240 mmHg   Venous Pressure (mmHg) 160 160   Dialysate Flow Rate (mL/min) 600 mL/min 600 mL/min   Transmembrane Pressure (mmHg) 60 mmHg 60 mmHg    Ultrafiltration Rate (mL/Hr) 980 mL/hr 980 mL/hr   Fluid Removal (ml) 4900 ml 5000 ml   Dialysate K (mEq/L) --  --    Dialysate CA (mEq/L) --  --    Hemodialysis Comments   Arteriovenous Lines Secure Yes Yes   Comments pt stable end of tx      02/02/16 1406 02/02/16 1430 02/02/16 1500   Vitals   Temp 97.7 F (36.5 C) --  --    Heart Rate 97 (!) 102 (!) 108   Resp Rate 18 18 18    BP (!) 177/104 (!) 188/119 (!) 176/132   Animal nutritionist Yes --  --    Blood Flow Rate (mL/min) 400 mL/min 400 mL/min 400 mL/min   Arterial Pressure (mmHg) -240 mmHg -240 mmHg -240 mmHg   Venous Pressure (mmHg) 160 160 160   Dialysate Flow Rate (mL/min) 600 mL/min 600 mL/min 600 mL/min   Transmembrane Pressure (mmHg) 60 mmHg 60 mmHg 60 mmHg   Ultrafiltration Rate (mL/Hr) 1590 mL/hr 1590 mL/hr 1590 mL/hr   Fluid Removal (ml) --  700 ml 1410 ml   Dialysate K (mEq/L) 2 mEq/L --  --    Dialysate CA (mEq/L) 2.5 mEq/L --  --    Hemodialysis Comments   Arteriovenous Lines Secure Yes --  Yes   Comments initiated tx no complications --  pt stable       02/02/16 1600 02/02/16 1630 02/02/16 1700   Vitals   Temp --  --  --    Heart Rate (!) 106 (!) 104 (!) 103   Resp Rate 18 18 18    BP (!) 183/112 (!) 166/123 (!) 195/116   Animal nutritionist --  --  --    Blood Flow Rate (mL/min) 400 mL/min 400 mL/min 400 mL/min   Arterial Pressure (mmHg) -240 mmHg -240 mmHg -240 mmHg   Venous Pressure (mmHg) 160 160 160   Dialysate Flow Rate (mL/min) 600 mL/min 600 mL/min 600 mL/min   Transmembrane Pressure (mmHg) 60 mmHg 60 mmHg 60 mmHg   Ultrafiltration Rate (mL/Hr) 980 mL/hr 980 mL/hr 980 mL/hr   Fluid Removal (ml) 2940 ml 3720 ml 4310 ml   Dialysate K (mEq/L) --  --  --    Dialysate CA (mEq/L) --  --  --    Hemodialysis Comments   Arteriovenous Lines Secure Yes Yes Yes   Comments pt having cramps in R leg, demanded to lower UF, Lowered to 5000, complaing of 10/10 pain in belly, RN notified  stable pt stable       02/02/16 1730 02/02/16 1740   Vitals   Temp --  --    Heart Rate (!) 105 (!) 106   Resp Rate 18 18   BP (!) 170/131 (!) 179/117   Animal nutritionist --  --    Blood Flow Rate (mL/min) 400 mL/min 400 mL/min   Arterial Pressure (mmHg) -240 mmHg -240 mmHg   Venous Pressure (mmHg) 160  160   Dialysate Flow Rate (mL/min) 600 mL/min 600 mL/min   Transmembrane Pressure (mmHg) 60 mmHg 60 mmHg   Ultrafiltration Rate (mL/Hr) 980 mL/hr 980 mL/hr   Fluid Removal (ml) 4900 ml 5000 ml   Dialysate K (mEq/L) --  --    Dialysate CA (mEq/L) --  --    Hemodialysis Comments   Arteriovenous Lines Secure Yes Yes   Comments pt stable end of tx            Intake/Output:  I/O this shift:  In: -   Out: 4500 [Other:4500]      Medications:  Current Facility-Administered Medications   Medication Dose Route Last Rate Last Dose   . heparin (porcine) injection 1,000-3,000 Units  1,000-3,000 Units Intracatheter            Education:  Education  Person taught: Patient (02/02/16 1750)  Knowledge basis: Minimal (02/02/16 1750)  Topics taught: Fluid Management;Procedure (02/02/16 1750)  Teaching Tools: Explain (02/02/16 1750)  Reponse: Needs Reinforcement (02/02/16 1750)    Primary Nurse Communication:  Bedside Nurse Communication  Name of bedside RN - pre dialysis: Arlys John (02/02/16 1350)  Name of bedside RN - post dialysis: Arlys John (02/02/16 1750)      [facility] nurse signature/date/time:__________________________________________

## 2016-02-02 NOTE — Progress Notes (Signed)
02/02/16 1312   Case Management Quick Doc   Case Management Assessment Status Assessment Complete   CM Comments RNCM: 10/16: RNCM working to secure an outaptient chair for patient- barriers d/t non compliance.  RNCM has been working with [facility], referral placed to [facility]- patient requesting. MD order for palliative med consult- goals of care. Patient has been refusing HD inpatient. c-diff+- PO vanco.    Expected Discharge Date 02/03/16       Evella Kasal K. Pinner-Moe Brier, RN BSN  Case Production designer, theatre/television/film   [facility]  [phone]

## 2016-02-02 NOTE — Progress Notes (Signed)
Date Time: 02/02/16 7:30 PM  Patient Name: [name], [name]       CSN: [id number]  Attending Physician: Jenness Corner, MD  Primary Care Provider: Christa See, MD    Interim summary [facility] course)     45 y.o. M with PMH of , ESRD  on hemodialysis over the past 7 years,  NON COMOPLIANT TO TRAETMENT,  anemia,   chronic systolic and diastolic congestive heart failure with LVEF of 35-40% and grade 2 diastolic dysfunction by 2-D echo in August 2017, HTN, mild intermittent asthma, tobacco dependence via cigarette smoking,WAS ADMITTED WITH     SOB AND CDAD .    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  Nephrology consulted ; started on HD   Pt has perma cath .  ID consulted         SUBJECTIVE:     CC: complaints of abdominal distention    Patient c/o sore throat, neck pain and abdominal pain/dsitention    PHYSICAL EXAM:   Temp:  [97.7 F (36.5 C)-98.3 F (36.8 C)] 97.7 F (36.5 C)  Heart Rate:  [96-114] 100  Resp Rate:  [18-20] 20  BP: (156-198)/(78-132) 198/88  Body mass index is 29.84 kg/m.    Intake/Output Summary (Last 24 hours) at 02/02/16 1930  Last data filed at 02/02/16 1750   Gross per 24 hour   Intake              450 ml   Output             4500 ml   Net            -4050 ml     Weight Monitoring 01/28/2016 01/29/2016 01/29/2016 01/30/2016 01/31/2016 02/01/2016 02/02/2016   Height - - - - - - -   Height Method - - - - - - -   Weight 96.752 kg (No Data) 99.655 kg 95.301 kg 101.538 kg 102.604 kg (No Data)   Weight Method Standing Scale - Standing Scale Actual Standing Scale Standing Scale -   BMI (calculated) - - - - - - -         Exam:   General: moderately built, no acute distress ,  Maintained on O2 per NC  Psychiatry: Patient is awake, alert and oriented x 3, mood is appropriate   HEENT: neck is supple, no palpable mass  Chest: CTA bilaterally. No crackles or wheezing. No accessory muscle use   CVS: S1,  S2 normal, RRR, no thrill   Abdomen: soft dsitended, nontender,no rebound or guarding, hypoactiv eBS  Musculoskeletal: No pitting edema, no cyanosis/clubbing. Expected ROM all 4 extremities. No joint swelling     MEDS: (SCHEDULED/INFUSIONS/PRN)     Current Facility-Administered Medications   Medication Dose Route Frequency   . albuterol-ipratropium  2 puff Inhalation Q6H   . amLODIPine  10 mg Oral Daily   . calcitRIOL  0.5 mcg Oral Mon-Wed-Fri   . carvedilol  25 mg Oral Q12H SCH   . dicyclomine  20 mg Oral QID   . famotidine  20 mg Oral Daily   . furosemide  80 mg Oral Daily   . heparin (porcine)  5,000 Units Subcutaneous Q12H [facility]   . hydrALAZINE  100 mg Oral Q8H SCH   . nitroglycerin  1 inch Topical Q6H HOLD MN   . sevelamer  800 mg Oral TID MEALS   . vancomycin  500 mg Oral 4 times per day     Current Facility-Administered Medications   Medication Dose Route Frequency Last Rate     Current Facility-Administered Medications   Medication Dose Route   . acetaminophen  650 mg Oral    Or   . acetaminophen  650 mg per NG tube    Or   . acetaminophen  650 mg Rectal   . albuterol  2 puff Inhalation   . albuterol  2.5 mg Nebulization   . heparin (porcine)  1,000-3,000 Units Intracatheter   . hydrALAZINE  10 mg Intravenous   . HYDROmorphone  0.5 mg Intravenous   . naloxone  0.4 mg Intravenous   . ondansetron  8 mg Oral    Or   . ondansetron  4 mg Intravenous   . oxyCODONE-acetaminophen  1 tablet Oral   . prochlorperazine  10 mg Intravenous         LABS:       Recent Labs  Lab 02/02/16  0453 02/01/16  0530  01/28/16  1133   WBC  --  5.6  --  4.7   RBC  --  2.85*  --  2.74*   Hemoglobin 8.4* 8.7* More results in Results Review 8.6*   Hematocrit 25.7* 26.6* More results in Results Review 25.8*   MCV  --  93  --  94   PLT CT  --  134  --  133   More results in Results Review = values in this interval not displayed.      Recent Labs  Lab 02/02/16  0453 02/01/16  0530   Sodium 139 138   Potassium 5.4* 5.8*   Chloride 104 103      CO2 21.1 23.8   BUN 79* 69*   Creatinine 11.75* 10.49*   Glucose 107* 92   EGFR 5* 6*   Calcium 7.2* 7.9*                                No results found for: [id number]      IMAGING STUDIES:   Ct Abdomen Pelvis Dry  (stone)    Result Date: 01/26/2016  Volume overload with cardiomegaly, ascites, edema, anasarca. Complex ascites. ReadingStation:[email or web address]    Xr Chest 2 Views    Result Date: 01/31/2016  1.  NO SIGNIFICANT CHANGE FROM 5 DAYS PRIOR. 2.  PERSISTENT SMALL EFFUSIONS AND MILD PULMONARY EDEMA. 3.  DEVELOPING RIGHT LUNG BASE CONSOLIDATION NOT EXCLUDED, THOUGH FELT DUE TO EDEMA. ReadingStation:[email or web address]    Ct Soft Tissue Neck Wo Contrast    Result Date: 02/02/2016  No enlargement of the adenoids or tonsils identified. Scattered small nodes throughout the neck most prominent in the right supraclavicular region. Findings probably represent reactive change. Collateral vessels within the neck, which may related to underlying venous occlusions. ReadingStation:[email or web address]    Nm Lung Ventilation Perfusion Aerosol    Result Date: 01/08/2016   Low probability for pulmonary embolism. Collene Schlichter, MD 01/08/2016 9:32 AM     US Abdomen Limited Ruq    Result Date: 01/29/2016  1. Heterogeneous liver parenchyma likely related to underlying nonspecific medical liver disease. 2. Small volume of abdominal ascites with the largest pocket of fluid in the right upper quadrant. 3. Gallbladder absent. 4. Atrophic echogenic right kidney corresponding to chronic medical renal disease. ReadingStation:[email or web address]    Xr Chest Ap Portable    Result Date: 01/26/2016  Left IJ double  lumen dialysis catheter tip at superior cavoatrial junction. Right subclavian vascular stent. Cardiomegaly. Mild pulmonary edema. No focal consolidation or suspicious infiltrates. Trace pleural effusions. ReadingStation:[email or web address]    US Venous Duplex Doppler Leg Bilateral    Result Date: 01/07/2016   No evidence for DVT. Charlott Rakes, MD 01/07/2016 2:43 PM     Xr  Abdomen Portable    Result Date: 01/30/2016  FINDINGS/IMPRESSION: 1.  PAUCITY OF BOWEL GAS SEEN; CONSISTENT WITH FLUID-FILLED LARGE AND SMALL BOWEL LOOPS AS ON PRIOR CT. 2.  HAZINESS CONSISTENT WITH ASCITES. 3.  NO ABNORMAL ABDOMINAL CALCIFICATIONS. 4.  NO ACUTE BONY ABNORMALITY. ReadingStation:[email or web address]    US Abdomen Limited Single Organ    Result Date: 02/02/2016  FINDINGS/IMPRESSION: LARGE ASCITES; LARGEST POCKET RLQ. ReadingStation:[email or web address]    US Abdomen Limited Single Organ    Result Date: 01/05/2016   Small to moderate volume of abdominal and pelvic ascites. Findings appear similar to that seen on 11/10/2015 CT, accounting for differences in technique Laurena Slimmer, MD 01/05/2016 8:30 AM       MICROBIOLOGY AND/OR TELEMETRY:       Stool + c diff     ASSESSMENT AND PLAN:     SOB , ascites anasarca sec to   Volume overload from missed hemodialysis in patient with ESRD on HD   Due to medical non-compliance;  Nephrology following   Paracentesis, encourage patient to continue HD       Recurrent Clostridium difficile diarrhea  States he has diarrhea but not witnessed      Accelerated essential HTN  Due to medical non-compliance  Cont BB   CCB   Increased  clonidine dose from 0.1 12 hourly to 0.2 mg 12 hourly .  BP is better controlled today still uncontrolled .    Systolic and diastolic CHF, chronic    Intake and output monitoring, daily weight  Telemetry monitoring  cont current meds   Cont HD as scheduled        Mild intermittent asthma with active tobacco smoking  Stable without exacerbation   Cont current meds     Recurrent ascites, only perihepatic, repeat limited US    Hyperkalemia, refusing HD, discussed his risk of arrhythmia and death    Hematochezia,stable HH        DVT Prophylaxis: Heparin    CODE Status : FULL CODE    Disposition: not ready for dischage    Plan discussed with patient, nursing staff.    Signed by: Jenness Corner, MD  [address]  [place]  Office Ph: [phone]    Please  contact at phone number [phone] if any questions.          Note: This chart was generated by the Epic EMR system/speech recognition and may contain inherent errors or omissions not intended by the user. Grammatical errors, random word insertions, deletions, pronoun errors and incomplete sentences are occasional consequences of this technology due to software limitations. Not all errors are caught or corrected. If there are questions or concerns about the content of this note or information contained within the body of this dictation they should be addressed directly with the author for clarification

## 2016-02-02 NOTE — Progress Notes (Signed)
Renal Progress Note    Follow up for:  ESRD    Subjective:     Seen on dialysis  Updated by nursing.  No new complaints reported    ROS: no SOB, CP, F/C, N/V/D, pruritis.    Objective:     Vitals:  BP (!) 184/93 Comment: RN Notified   Pulse 98   Temp 97.7 F (36.5 C) (Oral)   Resp 18   Ht 1.854 m (6\' 1" )   Wt 102.6 kg (226 lb 3.2 oz)   SpO2 100%   BMI 29.84 kg/m       Wt Readings from Last 1 Encounters:   02/01/16 0500 102.6 kg (226 lb 3.2 oz)   01/31/16 0526 101.5 kg (223 lb 13.6 oz)   01/30/16 0524 95.3 kg (210 lb 1.6 oz)   01/29/16 1410 99.7 kg (219 lb 11.2 oz)   01/28/16 0542 96.8 kg (213 lb 4.8 oz)   01/27/16 0445 99.8 kg (220 lb 1.6 oz)   01/26/16 0435 100.9 kg (222 lb 6.4 oz)   01/26/16 0358 100.9 kg (222 lb 6.4 oz)         Intake/Output Summary (Last 24 hours) at 02/02/16 1424  Last data filed at 02/01/16 2147   Gross per 24 hour   Intake              450 ml   Output                0 ml   Net              450 ml       Physical Exam:  General appearance - alert, in no distress  Chest - clear to auscultation, good respiratory effort  Heart - normal rate and regular rhythm, no rub appreciated  Abdomen - soft,  nondistended, bowel sounds normal  Extremities - trace edema  Skin-warm and dry      Dialysis Treatment Data:  Blood flow rate: 350 mL/min  Dialysate flow rate: 600 mL/min  Ultrafiltration rate: 720 mL/hr    Dialysate potassium: 2 mEq/L  Dialysate calcium: 2.5 mEq/L    Arterial pressure: -150 mmHg  Venous pressure:      Labs:  Labs reviewed.  Pertinent findings below.    RECENT LABS (from the last 7 days)  Recent Labs      02/02/16   0453  02/01/16   0530   WBC   --   5.6   Hemoglobin  8.4*  8.7*   Hematocrit  25.7*  26.6*   PLT CT   --   134     No results for input(s): PT, INR, APTT in the last 72 hours.  No results for input(s): TROPI, CK, CKMBINDEX in the last 72 hours.  No results for input(s): BNP in the last 72 hours.      US Abdomen Limited Single Organ    Result Date:  02/02/2016  FINDINGS/IMPRESSION: LARGE ASCITES; LARGEST POCKET RLQ. ReadingStation:[email or web address]     Recent Labs      02/02/16   0453  02/01/16   0530  01/31/16   0557   Glucose  107*  92  67*   Sodium  139  138  136   Potassium  5.4*  5.8*  6.0*   Chloride  104  103  103   CO2  21.1  23.8  20.6   BUN  79*  69*  54*   Creatinine  11.75*  10.49*  9.03*   EGFR  5*  6*  7*   Calcium  7.2*  7.9*  7.9*     No results for input(s): MG, PHOS in the last 72 hours.  No results for input(s): ALB, PROT, BILITOTAL, ALKPHOS, ALT, AST in the last 72 hours. No results for input(s): SGUR, LABPH, PROTEINUR, GLUUA, KETONESUA, BILIUA, BLOODUA, NITRITEUA, UROBILIUA, LEUA, NITRITEUA, WBCUA, RBCUA, BACTERIAUA in the last 72 hours.    Invalid input(s):  AMORPHOUSUA                   Assessment:     1. 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 On chronic hemodialysis, sporadically. Most recently on a Monday Wednesday Friday schedule by his report.  2. Nonadherence to prescribed dialysis and antihypertensive treatment regimen  3. Hyperkalemia-associated with missed dialysis treatment. Improved with hemodialysis  4. Metabolic acidosis-associated with missed dialysis treatment. Improved with hemodialysis  5. Volume overload-associated with missed dialysis treatment. Improved with hemodialysis  6. Anemia of ESRD  7. Secondary hyperparathyroidism  8. Hypertension-exacerbated by medication nonadherence as well as volume overload  9. C. difficile diarrhea      Plan:      Stable on HD thus far today; access functioning adequately   Continue dialysis on usual MWF schedule.   Maintain renal diet and fluid restrictions   Continue EPO thrice weekly   Continue to encourage adherence to antihypertensive and dialysis regimen   Continue oral calcitriol (had been on this rather than IV activated vitamin D by review of previous outpatient standing orders)   Await outpatient dialysis chair assignment - case management actively working on this       Orders  and Medications reviewed in Epic.  Case reviewed with RN    Cheral Marker, MD  2:24 PM  02/02/2016

## 2016-02-02 NOTE — Plan of Care (Addendum)
Problem: Renal Instability  Goal: Fluid and electrolyte balance are achieved/maintained  Outcome: Progressing   02/02/16 2250   Goal/Interventions addressed this shift   Fluid and electrolyte balance are achieved/maintained  Monitor intake and output every shift;Monitor/assess lab values and report abnormal values;Provide adequate hydration;Monitor daily weight;Assess for confusion/personality changes;Assess and reassess fluid and electrolyte status;Observe for cardiac arrhythmias;Follow fluid restrictions/IV/PO parameters       Comments: Patient reporting his blood pressure "is fine" this evening and not go given any further medications other than scheduled to help control it.   IVP dilaudid given this evening per pt request for pain 10/10 in neck and abdomen. He refuses to take PO percocet this evening to help inbetween dilaudid doses.  Asked patient to call if having diarrhea for visualization/assessment.   Will continue to monitor throughout this shift.       Shift summary: patient requesting IV dilaudid as ordered this shift. BP remains elevated, patient taking scheduled BP meds. Telemetry SR/ST with HR 90s-110s.   Patient reports that he had 2 loose BMs this shift. Patient did not allow RN to assess these. Will continue to monitor until the end of this shift.

## 2016-02-03 ENCOUNTER — Inpatient Hospital Stay: Payer: Medicare Other

## 2016-02-03 DIAGNOSIS — Z992 Dependence on renal dialysis: Secondary | ICD-10-CM

## 2016-02-03 DIAGNOSIS — A0472 Enterocolitis due to Clostridium difficile, not specified as recurrent: Secondary | ICD-10-CM

## 2016-02-03 DIAGNOSIS — N186 End stage renal disease: Secondary | ICD-10-CM

## 2016-02-03 DIAGNOSIS — F17210 Nicotine dependence, cigarettes, uncomplicated: Secondary | ICD-10-CM

## 2016-02-03 LAB — VH CELL COUNT BODY FLUID
Body Fluid Eosinophils: 2 %
Body Fluid Lymphocytes: 67 %
Body Fluid Monocytes: 23 %
Body Fluid Neutrophil Count: 8 %
Body Fluid RBC: 10739 /mm3
Total NUCLEATED CELL COUNT: 90 /mm3

## 2016-02-03 MED ORDER — HYDROMORPHONE HCL 2 MG PO TABS
2.0000 mg | ORAL_TABLET | ORAL | Status: DC | PRN
Start: 2016-02-03 — End: 2016-02-06
  Administered 2016-02-03 – 2016-02-06 (×6): 2 mg via ORAL
  Filled 2016-02-03 (×7): qty 1

## 2016-02-03 MED ORDER — ALBUMIN HUMAN 25 % IV SOLN
25.0000 g | Freq: Once | INTRAVENOUS | Status: AC
Start: 2016-02-03 — End: 2016-02-03
  Administered 2016-02-03: 25 g via INTRAVENOUS
  Filled 2016-02-03: qty 100

## 2016-02-03 NOTE — UM Notes (Signed)
[facility] Utilization Management Review Sheet    NAME: [name]  MR#: [id number]     DOB: 1970/09/02    CSN#: [id number]    ROOM: 543/543-A AGE: 45 y.o.    ADMIT DATE AND TIME: 01/26/2016  3:56 AM      PATIENT CLASS: Inpatient 01/26/2016 @ 0449     ATTENDING PHYSICIAN: Jenness Corner, MD  PAYOR:Payor: MEDICARE / Plan: MEDICARE PART A AND B / Product Type: *No Product type* /       AUTH #:     DIAGNOSIS:   ESRD needing dialysis N18.6, Z99.2   01/26/2016 Not Applicable   Clostridium difficile infection B96.89   01/26/2016 Yes       HISTORY:   Past Medical History:   Diagnosis Date   . Asthma without status asthmaticus    . 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 Clostridium difficile diarrhea    . Drug-seeking behavior    . Hypertension    . Kidney failure        DATE OF REVIEW: 02/03/2016    VITALS: BP 125/77   Pulse (!) 106   Temp 98.4 F (36.9 C) (Oral)   Resp 20   Ht 1.854 m (6\' 1" )   Wt 102.6 kg (226 lb 3.2 oz)   SpO2 99%   BMI 29.84 kg/m      PERITONEAL FLUID:  Gram Stain (Final)   Moderate WBC's Seen   No Organisms Seen    US Paracentesis:  Successful ultrasound guided drainage catheter insertion and subsequent large volume paracentesis.    ASSESSMENT AND PLAN:     SOB , ascites anasarca sec to   Volume overload from missed hemodialysis in patient with ESRD on HD   Due to medical non-compliance;  Nephrology following   Paracentesis, encourage patient to continue HD  Made him aware that his ascites likely related to his missed HD, he doest not believe so  I encouraged him to stay with his scheduled HD to prevent reaccumulation of ascites  S/P paracentesis  Will give 1 dose of albumin  Transition to PO dilaudid     Recurrent Clostridium difficile diarrhea  States he has diarrhea but not witnessed      Accelerated essential HTN  Due to medical non-compliance  Cont BB   CCB   Increased  clonidine dose from 0.1 12 hourly to 0.2 mg 12 hourly .  BP is better controlled today still uncontrolled  .    Systolic and diastolic CHF, chronic    Intake and output monitoring, daily weight  Telemetry monitoring  cont current meds   Cont HD as scheduled        Mild intermittent asthma with active tobacco smoking  Stable without exacerbation   Cont current meds     Recurrent ascites, only perihepatic, repeat limited US    Hyperkalemia, refusing HD, discussed his risk of arrhythmia and death    Hematochezia,stable HH        DVT Prophylaxis: Heparin    CODE Status : FULL CODE    Disposition: very difficult care of a noncompliant patient; need to arrange for HD chair, further care as OP    MEDICATIONS: Albumin 25g IV x 1 Heparin 5000units q 12 hrs Vancomycin 500mg  PO 4 times daily Dilaudid 0.5g IV q 3 hrs PRN x 5    Chari Parmenter L. Danella Penton RN BSN  [facility]  Utilization Review  [phone]  Fax [phone]

## 2016-02-03 NOTE — Consults (Signed)
[facility] Palliative Care Service  Initial Comprehensive Assessment  Date, Time: 02/03/16 12:12 PM  Patient Name: [name]  Referring Physician:  Jenness Corner, MD   Primary Care Physician: Christa See, MD  Consulting Team: Dolly Rias, MD; Lauretta Grill, NP; Carolin Coy, LCSW  Consulting Service: Palliative Medicine  Reason for Consult: communication and transitions.  Palliative care is a specialized, interdisciplinary approach to improving comfort and quality of life at any stage of a serious illness by addressing symptoms, communication, and transitions.       Assessment & Recommendations     Impression:  1-ESRD, HD dependent 3 x week.  2-Recurrent C-Difficile colitis.  3-Medical non-adherence.    Recommendations:  Challenging and difficult situation.  [name] describes multiple barriers to achieving medical care that is acceptable to him.  After this initial assessment and medical record review, it seems that he does not accept responsibility for his own health care, and is nonadherent to his medical treatment plan. He readily projects blame, and expresses conflicts he has with current outpatient and in patient providers.  Explained to him that finding a primary care provider is an essential first step in acquiring consistent outpatient medical care.      May want to consider obtaining a psychiatry consult for further mental health evaluation.    Goals of care: The patient wants highest priority given to independence (function), as opposed to comfort or longevity.  Clearly verbalizes that he is tired of being in the hospital and desires to be able to live his life.    Current CPR Status:  Full Code   Needs/wants further discussion of advance directives/code status:  []  Yes [x]  No    Disposition / Further attention / Follow-up: Uncertain discharge plans.  This visit was made in collaboration with Palliative Care Social Worker, [name], who is planning to discuss  discharge planning concerns with case manager. Palliative care will follow up with [name] on Thursday; but, unlikely to have any additional recommendations.                         Discussed with: Patient, Attending Physician-Dr. Sandria Manly, and Brandee-RN.    Thank you, Dr. Sandria Manly, for this consult. Please do not hesitate to contact the Palliative Care Service if you have questions about the above recommendations.     Joycelyn Das, NP    Palliative Care Service, [address], [address]  [address], [facility]  [place], [place] [address]  [phone]       History of Present Illness   [name]. [name] is a 45 y.o. male admitted to [facility] 01/26/2016 for further management of ESRD-hemodialysis from [facility] ED after presenting with shortness of breath and diarrhea.  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  [name] states that he has had persistent C-Diff since the beginning of the year despite antibiotic regimens prescribed.  He also states that he was scheduled for a fecal transplant at [facility] but that it was cancelled.  He does not know who cancelled it and does not know how to reschedule it.  He has had multiple hospitalizations this year at multiple facilities; including, [facility], and [facility] for chest pain, abdominal pain, diarrhea and C-Diff.  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  He states that he does not have a primary care provider, and is not happy with his current nephrologists in [place].  He states he was visiting family when he developed diarrhea requiring the most recent ED visit at [facility].      According to the record and discussions with health care  providers, [name] has missed dialysis treatments or declined dialysis because he did not feel well.  He has developed abdominal ascites and required paracentesis from time to time, most recently today when 5.1liters of fluid was drained.  He states he has not been told why he has been accumulating fluid in his abdomen.    He reports generalized abdominal pain and tender upon palpation.  He continues to have diarrhea, has had 3 episodes today.  His stool tested positive for C-Diff on 10/9; infectious disease consulted and has prescribed clear course of vancomycin therapy.        ADLs prior to admission:    Independent Needs assistance Dependent   Ambulation [x]   []  []    Transferring [x]  []  []    Dressing [x]  []  []    Bathing [x]  []  []    Toileting [x]  []  []    Feeding [x]  []  []      Advance Care Planning:  Advanced Directives:    Has: [x]  Yes []  No    Discussed: [x]  Yes []  No      Review of Systems     Review of Systems   Constitutional: Positive for malaise/fatigue.   HENT: Negative.    Eyes: Negative.    Respiratory: Negative.    Cardiovascular: Negative.    Gastrointestinal: Positive for abdominal pain, diarrhea and nausea.   Genitourinary:        Aneuric.   Musculoskeletal:        Intermittent "cramping" of hands and legs.   Skin: Negative.    Neurological: Negative.    Endo/Heme/Allergies: Negative.    Psychiatric/Behavioral: Negative.         Past Medical and Surgical History     Past Medical History:   Diagnosis Date   . Asthma without status asthmaticus    . 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 Clostridium difficile diarrhea    . Drug-seeking behavior    . Hypertension    . Kidney failure      Past Surgical History:   Procedure Laterality Date   . AV FISTULA PLACEMENT     . PERITONEAL CATHETER INSERTION         Social History     History   Smoking Status   . Current Every Day Smoker   . Packs/day: 0.25   . Types: Cigarettes   . Last attempt to quit: 09/18/2015   Smokeless Tobacco   . Former User     History    Alcohol Use No     History   Drug Use No     Marital Status: single  Lives with: alone  Family: mother, siblings and 2 children  Occupation: Emergency planning/management officer    Spirituality:       Faith & Importance: []  not important to patient    [x]  important to patient            Faith Community: [x]  not affiliated                         []   affiliated           Assessment/Issues: [x]  no spiritual needs at present                   []  Would like local clergy contacted           []  local clergy already aware                   []  Would like hospital chaplain contacted       Emergency contact listed: Extended Emergency Contact Information  Primary Emergency Contact: Steffanie Dunn [place]  Home Phone: [phone]  Mobile Phone: [phone]  Relation: Sister                 This person is the health-care power of attorney. [x]  Yes []  No                                      []  No designated health-care power of attorney    Family History     Family History   Problem Relation Age of Onset   . Hypertension Mother        Medications     Medications:     Current Facility-Administered Medications   Medication Dose Route Frequency   . albumin human  25 g Intravenous Once   . albuterol-ipratropium  2 puff Inhalation Q6H   . amLODIPine  10 mg Oral Daily   . calcitRIOL  0.5 mcg Oral Mon-Wed-Fri   . carvedilol  25 mg Oral Q12H SCH   . dicyclomine  20 mg Oral QID   . famotidine  20 mg Oral Daily   . furosemide  80 mg Oral Daily   . heparin (porcine)  5,000 Units Subcutaneous Q12H [facility]   . hydrALAZINE  100 mg Oral Q8H SCH   . nitroglycerin  1 inch Topical Q6H HOLD MN   . sevelamer  800 mg Oral TID MEALS   . vancomycin  500 mg Oral 4 times per day     acetaminophen **OR** acetaminophen **OR** acetaminophen, albuterol, albuterol, heparin (porcine), hydrALAZINE, HYDROmorphone, naloxone, ondansetron **OR** ondansetron, oxyCODONE-acetaminophen, prochlorperazine    Allergies     Allergies   Allergen Reactions   . Lisinopril  Anaphylaxis   . Toradol [Ketorolac Tromethamine] Anaphylaxis   . Morphine Other (See Comments)     "jittery" per patient, "I don't like the way it makes me feel". Previously charted anaphylaxis, but patient said just "jumpy, jittery"   . Percocet [Oxycodone-Acetaminophen] Other (See Comments)     "jittery" per patient, "I don't like the way it makes me feel"       Physical Exam     BP 125/77   Pulse (!) 106   Temp 98.4 F (36.9 C) (Oral)   Resp 20   Ht 1.854 m (6\' 1" )   Wt 102.6 kg (226 lb 3.2 oz)   SpO2 99%   BMI 29.84 kg/m     Physical Exam   Constitutional: He is oriented to person, place, and time and well-developed, well-nourished, and in no distress.   HENT:   Head: Normocephalic and atraumatic.   Eyes: Pupils are equal, round, and reactive to light.   Neck: Normal range of motion.   Cardiovascular: Normal heart sounds and intact distal pulses.    Tachycardic.   Pulmonary/Chest: Effort normal and breath sounds normal. No respiratory  distress.   Left upper chest wall dialysis catheter.   Abdominal: Soft. Bowel sounds are normal. He exhibits no distension. There is tenderness.   Musculoskeletal: He exhibits edema.   Left upper arm and Right upper arm both with nonfunctioning hemodialysis shunts.   Neurological: He is alert and oriented to person, place, and time.   Skin: Skin is warm and dry.   Psychiatric: Affect normal.      PPS: 80%  Labs / Radiology     Lab and diagnostics: reviewed noting anemia, hyerkalemia, elevated creatinine and BUN-ESRD, and hypocalcemia.      Recent Labs  Lab 02/02/16  0453 02/01/16  0530   WBC  --  5.6   Hemoglobin 8.4* 8.7*   Hematocrit 25.7* 26.6*   PLT CT  --  134        Recent Labs  Lab 02/02/16  0453   Sodium 139   Potassium 5.4*   Chloride 104   CO2 21.1   BUN 79*   Creatinine 11.75*   EGFR 5*   Glucose 107*   Calcium 7.2*            Eval / Mgmt / Counseling Time      I have spent 50 minutes with the patient and/or family members, discussing palliative care concepts,  advance directives (including resuscitation options/choices), medications (current or proposed therapies and side effects), the principle problem (listed above), the active hospital problems (listed above) and discharge planning issues.  More than 50% of this time was spent counseling and coordinating care.  Start time:14:00 Stop time: 14:50

## 2016-02-03 NOTE — Plan of Care (Addendum)
Problem: Renal Instability  Goal: Fluid and electrolyte balance are achieved/maintained  Outcome: Progressing   02/03/16 2052   Goal/Interventions addressed this shift   Fluid and electrolyte balance are achieved/maintained  Monitor intake and output every shift;Monitor/assess lab values and report abnormal values;Provide adequate hydration;Monitor daily weight;Assess for confusion/personality changes;Assess and reassess fluid and electrolyte status;Observe for cardiac arrhythmias;Follow fluid restrictions/IV/PO parameters       Comments: Paracentesis site CDI. IV dilaudid given this evening per pt request for 10/10 pain. Explained to patient that alternating PO and IV would be a good plan for pain control. He agrees to try.   Patient reports 3 BMs today. Telemetry on. Will continue to monitor throughout this shift.     Shift summary: patient refused to take PO vanc and blood pressure medications this morning and at midnight, due to patient not wanting to take anything PO. Telemetry SR/ST with HR 90s-100s. Compazine and zofran given throughout the night for nausea. No emesis assessed. Will continue to monitor until the end of this shift.

## 2016-02-03 NOTE — Progress Notes (Signed)
Renal Progress Note    Follow up for:  ESRD    Subjective:     Updated by nursing.  Interval notes reviewed.  Had paracentesis with 5.1 L removed    ROS: no SOB, CP, F/C, N/V/D, pruritis.    Objective:     Vitals:  BP 129/69   Pulse 98   Temp 98.7 F (37.1 C)   Resp 20   Ht 1.854 m (6\' 1" )   Wt 102.6 kg (226 lb 3.2 oz)   SpO2 99%   BMI 29.84 kg/m       Wt Readings from Last 1 Encounters:   02/01/16 0500 102.6 kg (226 lb 3.2 oz)   01/31/16 0526 101.5 kg (223 lb 13.6 oz)   01/30/16 0524 95.3 kg (210 lb 1.6 oz)   01/29/16 1410 99.7 kg (219 lb 11.2 oz)   01/28/16 0542 96.8 kg (213 lb 4.8 oz)   01/27/16 0445 99.8 kg (220 lb 1.6 oz)   01/26/16 0435 100.9 kg (222 lb 6.4 oz)   01/26/16 0358 100.9 kg (222 lb 6.4 oz)         Intake/Output Summary (Last 24 hours) at 02/03/16 1555  Last data filed at 02/03/16 1059   Gross per 24 hour   Intake              730 ml   Output             9600 ml   Net            -8870 ml       Physical Exam:  General appearance - alert, in no distress  Chest - clear to auscultation, good respiratory effort  Heart - normal rate and regular rhythm, no rub appreciated  Abdomen - soft,  nondistended, bowel sounds normal  Extremities - trace edema  Skin-warm and dry  Access - left IJ PC, site nontender and without erythema      Labs:  Labs reviewed.  Pertinent findings below.    RECENT LABS (from the last 7 days)  Recent Labs      02/02/16   0453  02/01/16   0530   WBC   --   5.6   Hemoglobin  8.4*  8.7*   Hematocrit  25.7*  26.6*   PLT CT   --   134     No results for input(s): PT, INR, APTT in the last 72 hours.  No results for input(s): TROPI, CK, CKMBINDEX in the last 72 hours.  No results for input(s): BNP in the last 72 hours.      Ct Soft Tissue Neck Wo Contrast    Result Date: 02/02/2016  No enlargement of the adenoids or tonsils identified. Scattered small nodes throughout the neck most prominent in the right supraclavicular region. Findings probably represent reactive change.  Collateral vessels within the neck, which may related to underlying venous occlusions. ReadingStation:[id number]    US Paracentesis    Result Date: 02/03/2016  Successful ultrasound guided drainage catheter insertion and subsequent large volume paracentesis. ReadingStation:[email or web address]     Recent Labs      02/02/16   0453  02/01/16   0530   Glucose  107*  92   Sodium  139  138   Potassium  5.4*  5.8*   Chloride  104  103   CO2  21.1  23.8   BUN  79*  69*   Creatinine  11.75*  10.49*   EGFR  5*  6*   Calcium  7.2*  7.9*     No results for input(s): MG, PHOS in the last 72 hours.  No results for input(s): ALB, PROT, BILITOTAL, ALKPHOS, ALT, AST in the last 72 hours. No results for input(s): SGUR, LABPH, PROTEINUR, GLUUA, KETONESUA, BILIUA, BLOODUA, NITRITEUA, UROBILIUA, LEUA, NITRITEUA, WBCUA, RBCUA, BACTERIAUA in the last 72 hours.    Invalid input(s):  AMORPHOUSUA                   Assessment:     1. 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 On chronic hemodialysis, sporadically. Most recently on a Monday Wednesday Friday schedule by his report.  2. Nonadherence to prescribed dialysis and antihypertensive treatment regimen  3. Hyperkalemia-associated with missed dialysis treatment. Improved with hemodialysis  4. Metabolic acidosis-associated with missed dialysis treatment. Improved with hemodialysis  5. Volume overload-associated with missed dialysis treatment. Improved with hemodialysis  6. Ascites - high likelihood of uremic ascites  7. Anemia of ESRD  8. Secondary hyperparathyroidism  9. Hypertension-exacerbated by medication nonadherence as well as volume overload  10. C. difficile diarrhea      Plan:      No changes today   Continue dialysis on usual MWF schedule.   Maintain renal diet and fluid restrictions   Continue EPO thrice weekly   Continue to encourage adherence to antihypertensive and dialysis regimen   Continue oral calcitriol (had been on this rather than IV activated vitamin D by review of previous  outpatient standing orders)   Await outpatient dialysis chair assignment - case management actively working on this       Orders and Medications reviewed in Epic.    Cheral Marker, MD  3:55 PM  02/03/2016

## 2016-02-03 NOTE — Plan of Care (Signed)
Problem: Renal Instability  Goal: Fluid and electrolyte balance are achieved/maintained  Outcome: Progressing   02/03/16 1741   Goal/Interventions addressed this shift   Fluid and electrolyte balance are achieved/maintained  Monitor intake and output every shift;Monitor/assess lab values and report abnormal values;Provide adequate hydration;Monitor daily weight;Assess for confusion/personality changes;Assess and reassess fluid and electrolyte status;Observe for seizure activity and initiate seizure precautions if indicated;Observe for cardiac arrhythmias;Monitor for muscle weakness;Follow fluid restrictions/IV/PO parameters       Comments: Pt has had frequent request for pain medication. Despite laughing talking on phone and watching tv pt continues to claim pain is 10/10. In NAD at this time.

## 2016-02-03 NOTE — Progress Notes (Signed)
02/03/16 2128   Case Management Quick Doc   Case Management Assessment Status Assessment Complete   CM Comments RNCM: 10/17: RNCM working to secure outpatient HD chair. for patient- barriers d/t non compliance.  RNCM has been working with [facility] (last assigned chair [facility] [name]) referal placed to [facility] per patient request..  Refused HD today. Paracentisis today- 5.1 liters removed.  c-diff-po vanco. Palliative med to consult- goals of care.    Expected Discharge Date 02/04/16       Farmer Mccahill K. Pinner-Iain Sawchuk, RN BSN  Case Production designer, theatre/television/film   [facility]  [phone]

## 2016-02-03 NOTE — Progress Notes (Signed)
Date Time: 02/03/16 1:24 PM  Patient Name: [name], [name]       CSN: [id number]  Attending Physician: Jenness Corner, MD  Primary Care Provider: Christa See, MD    Interim summary [facility] course)     45 y.o. M with PMH of , ESRD  on hemodialysis over the past 7 years,  NON COMOPLIANT TO TRAETMENT,  anemia,   chronic systolic and diastolic congestive heart failure with LVEF of 35-40% and grade 2 diastolic dysfunction by 2-D echo in August 2017, HTN, mild intermittent asthma, tobacco dependence via cigarette smoking,WAS ADMITTED WITH     SOB AND CDAD .    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  Nephrology consulted ; started on HD   Pt has perma cath .  ID consulted         SUBJECTIVE:     CC: complaints of abdominal distention    Refusing his HD again  States he is feeling bad  States that he had diarrhea 3x last night, today so far he only had once  Asking if he could go to [facility]    PHYSICAL EXAM:   Temp:  [97.7 F (36.5 C)-98.4 F (36.9 C)] 98.4 F (36.9 C)  Heart Rate:  [97-114] 106  Resp Rate:  [18-23] 20  BP: (125-198)/(77-132) 125/77  Body mass index is 29.84 kg/m.    Intake/Output Summary (Last 24 hours) at 02/03/16 1324  Last data filed at 02/03/16 1059   Gross per 24 hour   Intake              730 ml   Output             9600 ml   Net            -8870 ml     Weight Monitoring 01/29/2016 01/29/2016 01/30/2016 01/31/2016 02/01/2016 02/02/2016 02/03/2016   Height - - - - - - -   Height Method - - - - - - -   Weight (No Data) 99.655 kg 95.301 kg 101.538 kg 102.604 kg (No Data) (No Data)   Weight Method - Standing Scale Actual Standing Scale Standing Scale - -   BMI (calculated) - - - - - - -         Exam:   General: moderately built, no acute distress ,  Maintained on O2 per NC  Psychiatry: Patient is cooperative, mood is appropriate   HEENT: neck is supple, no palpable mass  Chest: CTA bilaterally. No  crackles or wheezing. No accessory muscle use   CVS: S1, S2 normal, RRR, no thrill   Abdomen: soft dsitended, nontender,no rebound or guarding, hypoactiv eBS  Musculoskeletal: No pitting edema, no cyanosis/clubbing. Expected ROM all 4 extremities. No joint swelling     MEDS: (SCHEDULED/INFUSIONS/PRN)     Current Facility-Administered Medications   Medication Dose Route Frequency   . albumin human  25 g Intravenous Once   . albuterol-ipratropium  2 puff Inhalation Q6H   . amLODIPine  10 mg Oral Daily   . calcitRIOL  0.5 mcg Oral Mon-Wed-Fri   . carvedilol  25 mg Oral Q12H SCH   . dicyclomine  20 mg Oral QID   . famotidine  20 mg Oral Daily   . furosemide  80 mg Oral Daily   . heparin (porcine)  5,000 Units Subcutaneous Q12H [facility]   . hydrALAZINE  100 mg  Oral Q8H SCH   . nitroglycerin  1 inch Topical Q6H HOLD MN   . sevelamer  800 mg Oral TID MEALS   . vancomycin  500 mg Oral 4 times per day     Current Facility-Administered Medications   Medication Dose Route Frequency Last Rate     Current Facility-Administered Medications   Medication Dose Route   . acetaminophen  650 mg Oral    Or   . acetaminophen  650 mg per NG tube    Or   . acetaminophen  650 mg Rectal   . albuterol  2 puff Inhalation   . albuterol  2.5 mg Nebulization   . heparin (porcine)  1,000-3,000 Units Intracatheter   . hydrALAZINE  10 mg Intravenous   . HYDROmorphone  0.5 mg Intravenous   . naloxone  0.4 mg Intravenous   . ondansetron  8 mg Oral    Or   . ondansetron  4 mg Intravenous   . oxyCODONE-acetaminophen  1 tablet Oral   . prochlorperazine  10 mg Intravenous         LABS:       Recent Labs  Lab 02/02/16  0453 02/01/16  0530  01/28/16  1133   WBC  --  5.6  --  4.7   RBC  --  2.85*  --  2.74*   Hemoglobin 8.4* 8.7* More results in Results Review 8.6*   Hematocrit 25.7* 26.6* More results in Results Review 25.8*   MCV  --  93  --  94   PLT CT  --  134  --  133   More results in Results Review = values in this interval not displayed.      Recent  Labs  Lab 02/02/16  0453 02/01/16  0530   Sodium 139 138   Potassium 5.4* 5.8*   Chloride 104 103   CO2 21.1 23.8   BUN 79* 69*   Creatinine 11.75* 10.49*   Glucose 107* 92   EGFR 5* 6*   Calcium 7.2* 7.9*                                No results found for: [id number]      IMAGING STUDIES:   Ct Abdomen Pelvis Dry  (stone)    Result Date: 01/26/2016  Volume overload with cardiomegaly, ascites, edema, anasarca. Complex ascites. ReadingStation:[email or web address]    Xr Chest 2 Views    Result Date: 01/31/2016  1.  NO SIGNIFICANT CHANGE FROM 5 DAYS PRIOR. 2.  PERSISTENT SMALL EFFUSIONS AND MILD PULMONARY EDEMA. 3.  DEVELOPING RIGHT LUNG BASE CONSOLIDATION NOT EXCLUDED, THOUGH FELT DUE TO EDEMA. ReadingStation:[email or web address]    Ct Soft Tissue Neck Wo Contrast    Result Date: 02/02/2016  No enlargement of the adenoids or tonsils identified. Scattered small nodes throughout the neck most prominent in the right supraclavicular region. Findings probably represent reactive change. Collateral vessels within the neck, which may related to underlying venous occlusions. ReadingStation:[email or web address]    Nm Lung Ventilation Perfusion Aerosol    Result Date: 01/08/2016   Low probability for pulmonary embolism. Collene Schlichter, MD 01/08/2016 9:32 AM     US Abdomen Limited Ruq    Result Date: 01/29/2016  1. Heterogeneous liver parenchyma likely related to underlying nonspecific medical liver disease. 2. Small volume of abdominal ascites with the largest pocket of fluid in the right upper quadrant. 3. Gallbladder absent. 4. Atrophic  echogenic right kidney corresponding to chronic medical renal disease. ReadingStation:[email or web address]    Xr Chest Ap Portable    Result Date: 01/26/2016  Left IJ double lumen dialysis catheter tip at superior cavoatrial junction. Right subclavian vascular stent. Cardiomegaly. Mild pulmonary edema. No focal consolidation or suspicious infiltrates. Trace pleural effusions. ReadingStation:[email or web address]    US Venous Duplex Doppler Leg  Bilateral    Result Date: 01/07/2016   No evidence for DVT. Charlott Rakes, MD 01/07/2016 2:43 PM     Xr Abdomen Portable    Result Date: 01/30/2016  FINDINGS/IMPRESSION: 1.  PAUCITY OF BOWEL GAS SEEN; CONSISTENT WITH FLUID-FILLED LARGE AND SMALL BOWEL LOOPS AS ON PRIOR CT. 2.  HAZINESS CONSISTENT WITH ASCITES. 3.  NO ABNORMAL ABDOMINAL CALCIFICATIONS. 4.  NO ACUTE BONY ABNORMALITY. ReadingStation:[email or web address]    US Abdomen Limited Single Organ    Result Date: 02/02/2016  FINDINGS/IMPRESSION: LARGE ASCITES; LARGEST POCKET RLQ. ReadingStation:[email or web address]    US Abdomen Limited Single Organ    Result Date: 01/05/2016   Small to moderate volume of abdominal and pelvic ascites. Findings appear similar to that seen on 11/10/2015 CT, accounting for differences in technique Laurena Slimmer, MD 01/05/2016 8:30 AM       MICROBIOLOGY AND/OR TELEMETRY:       Stool + c diff     ASSESSMENT AND PLAN:     SOB , ascites anasarca sec to   Volume overload from missed hemodialysis in patient with ESRD on HD   Due to medical non-compliance;  Nephrology following   Paracentesis, encourage patient to continue HD  Made him aware that his ascites likely related to his missed HD, he doest not believe so  I encouraged him to stay with his scheduled HD to prevent reaccumulation of ascites  S/P paracentesis  Will give 1 dose of albumin  Transition to PO dilaudid     Recurrent Clostridium difficile diarrhea  States he has diarrhea but not witnessed      Accelerated essential HTN  Due to medical non-compliance  Cont BB   CCB   Increased  clonidine dose from 0.1 12 hourly to 0.2 mg 12 hourly .  BP is better controlled today still uncontrolled .    Systolic and diastolic CHF, chronic    Intake and output monitoring, daily weight  Telemetry monitoring  cont current meds   Cont HD as scheduled        Mild intermittent asthma with active tobacco smoking  Stable without exacerbation   Cont current meds     Recurrent ascites, only perihepatic, repeat limited  US    Hyperkalemia, refusing HD, discussed his risk of arrhythmia and death    Hematochezia,stable HH        DVT Prophylaxis: Heparin    CODE Status : FULL CODE    Disposition: very difficult care of a noncompliant patient; need to arrange for HD chair, further care as OP    Plan discussed with patient, nursing staff.    Signed by: Jenness Corner, MD  [address]  [place]  Office Ph: [phone]    Please contact at phone number [phone] if any questions.          Note: This chart was generated by the Epic EMR system/speech recognition and may contain inherent errors or omissions not intended by the user. Grammatical errors, random word insertions, deletions, pronoun errors and incomplete sentences are occasional consequences of this technology due to software limitations. Not all errors are caught or corrected.  If there are questions or concerns about the content of this note or information contained within the body of this dictation they should be addressed directly with the author for clarification

## 2016-02-03 NOTE — Progress Notes (Signed)
Paracentesis completed,drained 5.1 Liters dk amber ascites, catheter removed and drsg applied to RLQ abdomen,states still has abdominal pain as he has been having, report called to Brandee,RN on floor with suggestion to discuss possible albumin order with primary MD since > 5 Liters fluid removed,awaiting transporter

## 2016-02-04 ENCOUNTER — Inpatient Hospital Stay: Payer: Medicare Other

## 2016-02-04 DIAGNOSIS — F54 Psychological and behavioral factors associated with disorders or diseases classified elsewhere: Secondary | ICD-10-CM

## 2016-02-04 LAB — BASIC METABOLIC PANEL
Anion Gap: 19.3 mMol/L — ABNORMAL HIGH (ref 7.0–18.0)
BUN / Creatinine Ratio: 6.1 Ratio — ABNORMAL LOW (ref 10.0–30.0)
BUN: 59 mg/dL — ABNORMAL HIGH (ref 7–22)
CO2: 24.1 mMol/L (ref 20.0–30.0)
Calcium: 7.9 mg/dL — ABNORMAL LOW (ref 8.5–10.5)
Chloride: 101 mMol/L (ref 98–110)
Creatinine: 9.72 mg/dL — ABNORMAL HIGH (ref 0.80–1.30)
EGFR: 7 mL/min/{1.73_m2} — ABNORMAL LOW (ref 60–150)
Glucose: 85 mg/dL (ref 70–99)
Osmolality Calc: 293 mOsm/kg (ref 275–300)
Potassium: 5.4 mMol/L — ABNORMAL HIGH (ref 3.5–5.3)
Sodium: 139 mMol/L (ref 136–147)

## 2016-02-04 LAB — HEMOGLOBIN AND HEMATOCRIT, BLOOD
Hematocrit: 30.1 % — ABNORMAL LOW (ref 39.0–52.5)
Hemoglobin: 9.9 gm/dL — ABNORMAL LOW (ref 13.0–17.5)

## 2016-02-04 MED ORDER — HYDROMORPHONE HCL 0.5 MG/0.5 ML IJ SOLN
0.5000 mg | INTRAMUSCULAR | Status: DC | PRN
Start: 2016-02-04 — End: 2016-02-05
  Administered 2016-02-04 – 2016-02-05 (×9): 0.5 mg via INTRAVENOUS
  Filled 2016-02-04 (×9): qty 0.5

## 2016-02-04 MED ORDER — METOCLOPRAMIDE HCL 5 MG/ML IJ SOLN
5.0000 mg | Freq: Three times a day (TID) | INTRAMUSCULAR | Status: DC
Start: 2016-02-04 — End: 2016-02-05
  Administered 2016-02-04 – 2016-02-05 (×2): 5 mg via INTRAVENOUS
  Filled 2016-02-04 (×4): qty 1

## 2016-02-04 NOTE — Consults (Signed)
PSYCHIATRY     [name],[name], 45 y.o. male  Date of Birth:  1971-01-25  The patient was seen, chart/labs reviewed.      Consult: Psychiatric evaluation, patient intermittently refuses hemodialysis    CC: "I'm fine, I does still feel physically well enough to have hemodialysis, mentally I'm fine "    HPI: Patient is a 45 year old male presenting with end-stage renal disease, requiring hemodialysis, hypertensive urgency, acute and chronic renal failure as well as ongoing diarrhea secondary to C. difficile in 10/09. Patient noted to be somewhat difficult with regards to excepting dialysis, controlling medical treatment by either refusal or selective acceptance including specific medication such as IV Dilaudid. Psychiatric evaluation recommended from palliative care standpoint.    "I'm fine, I just don't feel physically well known to have dialysis". Patient states that he understands that he needs and requires hemodialysis because of his renal disease however he goes on to say that at this time he has abdominal cramping, GI distress and this limits his copper level during long periods of dialysis. States that his energy is so low, he cannot manage to tolerate the energy required for dialysis. Patient denies depression, states that he is "frustrated". States that he has had episodes of C. difficile now at least 10 times with similar treatments only to have recurrence of same infection. Mentions that he wants a referral to [facility] for a fecal transplant, states that this would enable him to pursue kidney transplant (states that his sister is willing to donate). "I have a lot to live for, I'm still young, I'm going to overcome this". Denies anxiety except for expected anxiety with prolonged hospitalization, recurrence of illness and ongoing comorbid medical issues. Denies panic, PTSD. Denies service connection from [employer] for either. No history of outpatient treatment, medication management or counseling for  depression, anxiety. No evidence of bipolar, psychosis. No SI/HI within thought content evident. Denies feeling hopeless or helpless.    Noted, patient has been somewhat contentious as well as controlling with his medical treatment in the form of either refusing or denying various modes of treatment. Previous notes indicate the patient projects blame, feels rejected, has been saying "nobody helps me". He mentions himself having frustration with various doctors not only here but elsewhere. Does not mention any issues with accountability, adherence or compliance with medications. Documented previously that patient has long history of same. Specifically he names [name] [facility]. "Nobody really helps me". Despite this, patient does not seem litigious, angry but fatigued, exhausted, and with low energy. Pain primarily in the abdomen area.    Past Psychiatry/Substance related History:   Denies    Substance History   denies    PAST Med:   Past Medical History:   Diagnosis Date   . Asthma without status asthmaticus    . 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 Clostridium difficile diarrhea    . Drug-seeking behavior    . Hypertension    . Kidney failure         ALLERGIES: Lisinopril; Toradol [ketorolac tromethamine]; Morphine; and Percocet [oxycodone-acetaminophen]    Current meds:   Current Facility-Administered Medications   Medication Dose Route Frequency Provider Last Rate Last Dose   . acetaminophen (TYLENOL) tablet 650 mg  650 mg Oral Q4H PRN Ester Rink, MD        Or   . acetaminophen (TYLENOL) 160 MG/5ML oral solution 650 mg  650 mg per NG tube Q4H PRN Ester Rink, MD  Or   . acetaminophen (TYLENOL) suppository 650 mg  650 mg Rectal Q4H PRN Ester Rink, MD       . albuterol (PROVENTIL HFA;VENTOLIN HFA) inhaler 2 puff  2 puff Inhalation Q6H PRN Ilda Mori, MD   2 puff at 02/04/16 [phone]   . albuterol (PROVENTIL) nebulizer solution 2.5 mg  2.5 mg Nebulization BID PRN Ester Rink, MD       . albuterol-ipratropium (COMBIVENT RESPIMAT) inhaler 2 puff  2 puff Inhalation Q6H Ester Rink, MD   2 puff at 01/31/16 1722   . amLODIPine (NORVASC) tablet 10 mg  10 mg Oral Daily Ester Rink, MD   10 mg at 02/03/16 1144   . calcitRIOL (ROCALTROL) capsule 0.5 mcg  0.5 mcg Oral Mon-Wed-Fri Versie Starks, MD   0.5 mcg at 02/02/16 1009   . carvedilol (COREG) tablet 25 mg  25 mg Oral Q12H SCH Ester Rink, MD   25 mg at 02/03/16 1922   . dicyclomine (BENTYL) tablet 20 mg  20 mg Oral QID Otho Ket, MD   20 mg at 02/03/16 2113   . famotidine (PEPCID) tablet 20 mg  20 mg Oral Daily Otho Ket, MD   20 mg at 02/03/16 1144   . furosemide (LASIX) tablet 80 mg  80 mg Oral Daily Ester Rink, MD   80 mg at 02/03/16 1144   . heparin (porcine) injection 1,000-3,000 Units  1,000-3,000 Units Intracatheter PRN Versie Starks, MD       . heparin (porcine) PF injection 5,000 Units  5,000 Units Subcutaneous Q12H [facility] Ester Rink, MD       . hydrALAZINE (APRESOLINE) injection 10 mg  10 mg Intravenous 4X Daily PRN Ester Rink, MD   10 mg at 01/31/16 1539   . hydrALAZINE (APRESOLINE) tablet 100 mg  100 mg Oral Q8H SCH Ester Rink, MD   100 mg at 02/03/16 2113   . HYDROmorphone (DILAUDID) injection 0.5 mg  0.5 mg Intravenous Q3H PRN Jenness Corner, MD   0.5 mg at 02/04/16 1406   . HYDROmorphone (DILAUDID) tablet 2 mg  2 mg Oral Q4H PRN Jenness Corner, MD   2 mg at 02/03/16 2113   . metoclopramide (REGLAN) injection 5 mg  5 mg Intravenous TID AC Tayag, Alanda Slim, MD       . naloxone [facility]) injection 0.4 mg  0.4 mg Intravenous PRN Ester Rink, MD       . nitroglycerin (NITRO-BID) 2 % ointment 1 inch  1 inch Topical Q6H HOLD MN Ester Rink, MD       . ondansetron (ZOFRAN-ODT) disintegrating tablet 8 mg  8 mg Oral Q4H PRN Ester Rink, MD   8 mg at 01/26/16 2201    Or   . ondansetron (ZOFRAN) injection 4 mg  4 mg Intravenous Q4H PRN Ester Rink, MD   4 mg at 02/04/16 1118   .  oxyCODONE-acetaminophen (PERCOCET) 5-325 MG per tablet 1 tablet  1 tablet Oral Q6H PRN Otho Ket, MD       . prochlorperazine (COMPAZINE) injection 10 mg  10 mg Intravenous Q4H PRN Ester Rink, MD   10 mg at 02/04/16 1230   . sevelamer (RENVELA) tablet 800 mg  800 mg Oral TID MEALS Ester Rink, MD   800 mg at 02/03/16 1816   . vancomycin 50 mg/mL oral solution 500 mg  500 mg Oral 4  times per day Otho Ket, MD   500 mg at 02/03/16 1817       Family Psychiatric History:   Denies    Social History:   Resides: Resides in [place], [place].  Military: Former Electronics engineer that    [employer]:   General Appearance: appears older than stated age, good/fair eye contact through interview, acceptable hygiene, no psychomotor retardation, no psychomotor agitation and dressed in hospital attire  Mood:  "I'm fine"  Affect:  Mood Congruent and Limited Range  Thought Process:  Linear, Logical, Organized and Goal Directed  Thought Content:  No Suicidal thoughts, intent or plan, No Homicidal thoughts, intent or plan, No delusional thought content and Preoccupation noted  Perceptual Disturbance:  No auditory, tacile, olfactory, visual hallucination  Insight/Judgment:  Limited with regards to insight, questionable judgment being that he refuses care intermittently   Cognition:   Alert, Oriented to time, place and person and Attention Good, Attentive through interview, able to concentrate on task  Assessment:   Psychological factors affecting medical condition  Cluster B personality traits-narcissistic    Summary:   Patient is a 45 year old male with a number of medical comorbidities, end-stage suffering from ongoing C. Difficile/treatment with discomfort/abdominal pain with intermittent cooperation and adherence to recommended treatment.    His train of thought or logic seems to be stemming from his interest in a fecal implant because he reasons that this is preventing him from receiving a kidney transplant (in the future).  Unclear the likelihood of this, patient discusses the fact that he had a referral to [facility]. Primary team looking into referral after discharged from medicine. Encouraged staff and patient to modify communication especially being the patient seems to be in a regressed state because of his medical condition. Encourage patient to work with the team, attempted insight oriented focused on how his participation, his compliance with recommendations will overall be more helpful than harmful.      He seems to be clear in terms of his understanding of why he needs hemodialysis, the risks and benefits if he chooses not to afford extended period time. Understands that there is really no other option, other than a transplant that he is not a candidate for at this time (c-diff). Seems to be wavering on when and how he receives hematemesis.     In cases such as these, patient's don't necessarily have to have a narcissistic personality but because of a subconscious feeling of a lack of control over medical issues, present in such a way that they appear controlling, entitled and use defense mechanisms/coping skills that are described as a regressed state of immature and unhealthy attempts to control various other areas of their care ie; denial/refusal.     Plan:   Please continue to address the underlying medical/surgical condition for admission.     Medications addressed: No medications indicated at this time.     No BHS transfer indicated at this time. No clear benefit based on current MSE/presentation.     Reviewed importance of adherance with all treatment modalities including medications and follow up appointments. Psychoeducation and supportive psychotherapy provided as needed. RN/CM/SW should alert patient during discharge that the patient will have to follow up with their outpatient provider for further psychiatric medication changes or refills upon discharge from the medical unit.      Loma Sousa, DO  02/04/2016 3:29  PM    Psychiatry [phone] during daytime hours or #[phone] for Behavioral Health Intake 24/7  Weekend/Holiday coverage can be  accessed on Web on Call    Thank you for the consult. Will follow up as needed.

## 2016-02-04 NOTE — Plan of Care (Signed)
Problem: Renal Instability  Goal: Fluid and electrolyte balance are achieved/maintained  Outcome: Progressing

## 2016-02-04 NOTE — Brief Op Note (Signed)
Spoke with Ms. Morton Amy about pt refusing to be compliant with medications and hemo dialysis. Administered ondansteron for nausea IV push and dilaudid IV push. Continuing to assess. Pt earlier refused HD and then expressed at 1125 hours that he might want to go to dialysis. HD nurse told me no room in the schedule for pt.

## 2016-02-04 NOTE — Progress Notes (Addendum)
Palliative Care Social Work note:    This SW joined Palliative Care NP T. Shearrow for initial consult this date. As detailed in NP T. Shearrow's note, patient has a history of non-adherence to his treatment plan and speaks of any treatment problems/issues to be on behalf of providers whom he hasn't cared for.  When asked to describe what would improve his quality of life and what his goals are, he readily states that having a fecal transplant at [facility] would enable him to move forward. He describes the challenges and difficulties of living with a C-Diff diagnosis since February 2017, and states that he doesn't understand why the transplant was cancelled and why "nobody will help" him get a new one rescheduled.  Discussion around having a PCP to help oversee his care and facilitate referrals was offered, though patient seems to hold out hope that a doctor from here will arrange it. He also speaks of the the problems of fluid buildup, which he states started in January 2017, but it is his opinion that "no doctors" know why it is reoccurring, and he himself does not see a correlation between the issue of his non-adherence to his treatment recommendations and the buildup of his abdominal ascites. 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 He states he has provided a copy of this to the hospital, but it is not in his EPIC record. Patient states he assigned his mother and his sister as his health care proxies.  He has 2 sons, one a young adult and on a teenager. He speaks of his illness as inhibiting him from having the time to spend with him, and this makes him very frustrated. He denies depression, and states that the overwhelming feeling about  his illnesses is "frustration" and that he feels he will overcome his need for repeat hospitalizations, as he has "been through worse."     Collaborated with RNCM A. Pinner-McKay regarding the above. She has been working on helping patient secure another outpatient dialysis chair so that he may have continuity of care after d/c.     Carolin Coy, LCSW  Palliative Care Social Worker  [phone]

## 2016-02-04 NOTE — Progress Notes (Signed)
02/04/16 1351   Case Management Quick Doc   Case Management Assessment Status Assessment Complete   CM Comments RNCM: 10/18: RNCM working to secure outpatient HD chair- barriers d/t non compliance.  RNCM has been working with [facility] (last [facility] [name], [place]) referral placed to [facility] per patient request. [facility] [place], [place] declined patient. Per [facility] their regional operational manager is reviewing the patients case. palliative care consult. Psych consulted.c-diff +- PO vanco.    Expected Discharge Date 02/05/16       [name] K. Pinner-Mieko Kneebone, RN BSN  Case Production designer, theatre/television/film   [facility]  [phone]

## 2016-02-04 NOTE — Progress Notes (Signed)
Received a phone call from Rashita with [facility] admissions. Per Rashita the [facility] on [place], [place] has declined the patient. Per Rashita the regional operation manager is reviewing the patients case.  [facility] admission, [place] [phone], ext. [phone], reports that patient has been declined by all facilities in the [place] area have declined patient except his most recent [facility] center in [place], [place], they are requesting for the patient to contact them to discuss his non compliance issues prior to them accpeting him back- patient refuses to speak with their clinical manager, Raynelle Fanning.

## 2016-02-04 NOTE — Brief Op Note (Signed)
Pt refuses all PO meds. Called Dr. Sandria Manly and advised. No new orders. Pt will only take dilauded IV push.

## 2016-02-04 NOTE — Progress Notes (Signed)
Nutrition Therapy  Nutrition Follow Up    Patient Information:     Name:[name]   Age: 45 y.o.   Sex: male     MRN: [id number]      Recommendation:     1. Continue renal diet, 1500 ml FR  2. gelatein plus bid    Nutrition Assessment:     Pt with h/o ESRD/HD adm with recurrent c-diff and volume overload due to noncompliance to prescribed dialysis regimen. S/p paracentesis yesterday. Refused dialysis this morning and 0800 meds except for dilaudid. PO intake fair, consuming 25-100% of meals though often refusing. Needs much encouragement.    Nutrition Risk Level: Moderate      Nutrition Diagnosis:     Inadequate oral intake related to chronic illness as evidenced by poor po intake, refusing meals     Monitoring:  Evaluation:    PO/EN/PN intake:  Amount of food , General healthful diet and Liquid meal replacement, or supplement intake   Labs:  Electrolyte Profile, Renal Profile and Glucose, casual    GI Profile:  Bowel Function   Nutrition Focused Physical:  Overall appearance and Digestive system       Assessment Data:     Height: 1.854 m (6\' 1" )   Weight: 95.1 kg (209 lb 9.6 oz)   BMI: Body mass index is 27.65 kg/m.   IBW: 83.6 kg    Pertinent Meds: lasix, abx, +others  Pertinent Labs:    Recent Labs  Lab 02/04/16  0818 02/02/16  0453 02/01/16  0530 01/31/16  0557 01/30/16  0533   Sodium 139 139 138 136 138   Potassium 5.4* 5.4* 5.8* 6.0* 5.1   Chloride 101 104 103 103 103   CO2 24.1 21.1 23.8 20.6 24.3   BUN 59* 79* 69* 54* 40*   Creatinine 9.72* 11.75* 10.49* 9.03* 7.98*   Glucose 85 107* 92 67* 103*   Calcium 7.9* 7.2* 7.9* 7.9* 7.7*      Diet Order:  Orders Placed This Encounter   Procedures   . Diet renal 80 GM Protein (NO PROTEIN RESTRICTION); NO ADDED SALT; 1500 ML FLUID; (LESS THAN 65 meq POTASSIUM AND LESS THAN 800 meq OF PHOSPHOROUS PER DAY)      GI symptoms:    +bm 10/17  Skin: no PI noted    Estimated Needs:  Estimated Energy Needs  Total Energy Estimated Needs: 2100-2500 kcal  Method for Estimating  Needs: 25-30 kcal/kg ibw (83.6 kg)  Estimated Protein Needs  Total Protein Estimated Needs: 100-125 gm  Method for Estimating Needs: 1.2-1.5 gm/kg ibw (83.6 kg)       Additional Comments:       Herma Ard, RD, MA  02/04/2016 11:02 AM

## 2016-02-04 NOTE — Plan of Care (Signed)
Problem: Renal Instability  Goal: Fluid and electrolyte balance are achieved/maintained  Outcome: Progressing   02/04/16 1153   Goal/Interventions addressed this shift   Fluid and electrolyte balance are achieved/maintained  Monitor intake and output every shift;Monitor/assess lab values and report abnormal values;Provide adequate hydration;Assess for confusion/personality changes;Assess and reassess fluid and electrolyte status;Observe for seizure activity and initiate seizure precautions if indicated;Observe for cardiac arrhythmias;Monitor for muscle weakness;Follow fluid restrictions/IV/PO parameters   Discussed with patient plan of care and importance of compliance with treatment. Demonstrates understanding at this time. Will continue to monitor.

## 2016-02-04 NOTE — Progress Notes (Signed)
Date Time: 02/04/16 7:33 PM  Patient Name: [name], [name]       CSN: [id number]  Attending Physician: Jenness Corner, MD  Primary Care Provider: Christa See, MD    Interim summary [facility] course)     45 y.o. M with PMH of , ESRD  on hemodialysis over the past 7 years,  NON COMOPLIANT TO TRAETMENT,  anemia,   chronic systolic and diastolic congestive heart failure with LVEF of 35-40% and grade 2 diastolic dysfunction by 2-D echo in August 2017, HTN, mild intermittent asthma, tobacco dependence via cigarette smoking,WAS ADMITTED WITH     SOB AND CDAD .    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  Nephrology consulted ; started on HD   Pt has perma cath .  ID consulted     Patient continued to have same behavior as above; Refusing hemodialysis total point where he had reaccumulated ascites and requiring paracentesis which yielded 5.1 L; he again continued to have been refusing hemodialysis afterwards despite the fact that the abdominal distention is relieved. He was started on Dilaudid by mouth as planned.        SUBJECTIVE:     CC: complaints of abdominal distention    Patient had been refusing his medication and his dialysis stating that he could go to dialysis tomorrow if she he feels better  He would thousand nurse to call me and informed me that he is in much pain  I have again reviewed the chart, he had CT of the abdomen that is unremarkable, he had an abdominal ultrasound as well  I have told him that his symptoms had been chronic, and so far we have had imaging studies done  Staff reports that he has a small bowel movement that is solid pellet size, but I suspect that he may have now constipation because of round-the-cloc opioid; he apparently vomited light green material in front of the nurse    PHYSICAL EXAM:   Temp:  [97.6 F (36.4 C)-98.8 F (37.1 C)] 98.8 F (37.1 C)  Heart Rate:  [97-107]  107  Resp Rate:  [16-18] 16  BP: (169-198)/(90-105) 170/90  Body mass index is 27.65 kg/m.    Intake/Output Summary (Last 24 hours) at 02/04/16 1933  Last data filed at 02/04/16 1450   Gross per 24 hour   Intake              920 ml   Output                0 ml   Net              920 ml     Weight Monitoring 01/29/2016 01/30/2016 01/31/2016 02/01/2016 02/02/2016 02/03/2016 02/04/2016   Height - - - - - - -   Height Method - - - - - - -   Weight 99.655 kg 95.301 kg 101.538 kg 102.604 kg (No Data) (No Data) 95.074 kg   Weight Method Standing Scale Actual Standing Scale Standing Scale - - Standing Scale   BMI (calculated) - - - - - - -         Exam:   General: moderately built, no acute distress ,  Maintained on O2 per NC  Psychiatry: Patient is cooperative, mood is appropriate   HEENT: neck is supple, no palpable mass  Chest: CTA bilaterally. No crackles or wheezing. No accessory muscle  use   CVS: S1, S2 normal, RRR, no thrill   Abdomen: soft dsitended, nontender,no rebound or guarding, hypoactiv eBS  Musculoskeletal: No pitting edema, no cyanosis/clubbing. Expected ROM all 4 extremities. No joint swelling     MEDS: (SCHEDULED/INFUSIONS/PRN)     Current Facility-Administered Medications   Medication Dose Route Frequency   . albuterol-ipratropium  2 puff Inhalation Q6H   . amLODIPine  10 mg Oral Daily   . calcitRIOL  0.5 mcg Oral Mon-Wed-Fri   . carvedilol  25 mg Oral Q12H SCH   . dicyclomine  20 mg Oral QID   . famotidine  20 mg Oral Daily   . furosemide  80 mg Oral Daily   . heparin (porcine)  5,000 Units Subcutaneous Q12H [facility]   . hydrALAZINE  100 mg Oral Q8H SCH   . metoclopramide  5 mg Intravenous TID AC   . nitroglycerin  1 inch Topical Q6H HOLD MN   . sevelamer  800 mg Oral TID MEALS   . vancomycin  500 mg Oral 4 times per day     Current Facility-Administered Medications   Medication Dose Route Frequency Last Rate     Current Facility-Administered Medications   Medication Dose Route   . acetaminophen  650 mg  Oral    Or   . acetaminophen  650 mg per NG tube    Or   . acetaminophen  650 mg Rectal   . albuterol  2 puff Inhalation   . albuterol  2.5 mg Nebulization   . heparin (porcine)  1,000-3,000 Units Intracatheter   . hydrALAZINE  10 mg Intravenous   . HYDROmorphone  0.5 mg Intravenous   . HYDROmorphone  2 mg Oral   . naloxone  0.4 mg Intravenous   . ondansetron  8 mg Oral    Or   . ondansetron  4 mg Intravenous   . oxyCODONE-acetaminophen  1 tablet Oral   . prochlorperazine  10 mg Intravenous         LABS:       Recent Labs  Lab 02/04/16  0818 02/02/16  0453 02/01/16  0530   WBC  --   --  5.6   RBC  --   --  2.85*   Hemoglobin 9.9* 8.4* 8.7*   Hematocrit 30.1* 25.7* 26.6*   MCV  --   --  93   PLT CT  --   --  134         Recent Labs  Lab 02/04/16  0818 02/02/16  0453   Sodium 139 139   Potassium 5.4* 5.4*   Chloride 101 104   CO2 24.1 21.1   BUN 59* 79*   Creatinine 9.72* 11.75*   Glucose 85 107*   EGFR 7* 5*   Calcium 7.9* 7.2*                                No results found for: [id number]      IMAGING STUDIES:   Ct Abdomen Pelvis Dry  (stone)    Result Date: 01/26/2016  Volume overload with cardiomegaly, ascites, edema, anasarca. Complex ascites. ReadingStation:[email or web address]    Xr Chest 2 Views    Result Date: 01/31/2016  1.  NO SIGNIFICANT CHANGE FROM 5 DAYS PRIOR. 2.  PERSISTENT SMALL EFFUSIONS AND MILD PULMONARY EDEMA. 3.  DEVELOPING RIGHT LUNG BASE CONSOLIDATION NOT EXCLUDED, THOUGH FELT DUE TO EDEMA. ReadingStation:[email or web address]  Ct Soft Tissue Neck Wo Contrast    Result Date: 02/02/2016  No enlargement of the adenoids or tonsils identified. Scattered small nodes throughout the neck most prominent in the right supraclavicular region. Findings probably represent reactive change. Collateral vessels within the neck, which may related to underlying venous occlusions. ReadingStation:[email or web address]    Nm Lung Ventilation Perfusion Aerosol    Result Date: 01/08/2016   Low probability for pulmonary embolism. Collene Schlichter, MD  01/08/2016 9:32 AM     US Abdomen Limited Ruq    Result Date: 01/29/2016  1. Heterogeneous liver parenchyma likely related to underlying nonspecific medical liver disease. 2. Small volume of abdominal ascites with the largest pocket of fluid in the right upper quadrant. 3. Gallbladder absent. 4. Atrophic echogenic right kidney corresponding to chronic medical renal disease. ReadingStation:[email or web address]    Xr Chest Ap Portable    Result Date: 01/26/2016  Left IJ double lumen dialysis catheter tip at superior cavoatrial junction. Right subclavian vascular stent. Cardiomegaly. Mild pulmonary edema. No focal consolidation or suspicious infiltrates. Trace pleural effusions. ReadingStation:[email or web address]    US Venous Duplex Doppler Leg Bilateral    Result Date: 01/07/2016   No evidence for DVT. Charlott Rakes, MD 01/07/2016 2:43 PM     Xr Abdomen Portable    Result Date: 02/04/2016  No bowel dilatation or obstruction. ReadingStation:[email or web address]    Xr Abdomen Portable    Result Date: 01/30/2016  FINDINGS/IMPRESSION: 1.  PAUCITY OF BOWEL GAS SEEN; CONSISTENT WITH FLUID-FILLED LARGE AND SMALL BOWEL LOOPS AS ON PRIOR CT. 2.  HAZINESS CONSISTENT WITH ASCITES. 3.  NO ABNORMAL ABDOMINAL CALCIFICATIONS. 4.  NO ACUTE BONY ABNORMALITY. ReadingStation:[email or web address]    US Abdomen Limited Single Organ    Result Date: 02/02/2016  FINDINGS/IMPRESSION: LARGE ASCITES; LARGEST POCKET RLQ. ReadingStation:[email or web address]    US Paracentesis    Result Date: 02/03/2016  Successful ultrasound guided drainage catheter insertion and subsequent large volume paracentesis. ReadingStation:[email or web address]      MICROBIOLOGY AND/OR TELEMETRY:       Stool + c diff     ASSESSMENT AND PLAN:     SOB , ascites anasarca sec to   Volume overload from missed hemodialysis in patient with ESRD on HD   Due to medical non-compliance;  Nephrology following   Paracentesis, encourage patient to continue HD  Made him aware that his ascites likely related to his missed HD, he doest not believe so  I  encouraged him to stay with his scheduled HD to prevent reaccumulation of ascites  S/P paracentesis  Obtain abdominal x-ray to rule out structural       Recurrent Clostridium difficile diarrhea  Appeared to have resolved with solid bowel movement, continue Vanco by mouth    he can follow up in [facility] as he chooses      Accelerated essential HTN  Due to medical non-compliance  No further adjustment since patient is noncompliant    Systolic and diastolic CHF, chronic    Intake and output monitoring, daily weight  Telemetry monitoring  cont current meds   Cont HD as scheduled        Mild intermittent asthma with active tobacco smoking  Stable without exacerbation   Cont current meds     Recurrent ascites,  status post paracentesis    Hyperkalemia, refusing HD, discussed his risk of arrhythmia and death    Hematochezia,stable HH        DVT Prophylaxis: Heparin    CODE Status : FULL CODE    Disposition: very difficult  care of a noncompliant patient; need to arrange for HD chair, further care as OP  A management cold     Plan discussed with patient, nursing staff.    Signed by: Jenness Corner, MD  [address]  [place]  Office Ph: [phone]    Please contact at phone number [phone] if any questions.          Note: This chart was generated by the Epic EMR system/speech recognition and may contain inherent errors or omissions not intended by the user. Grammatical errors, random word insertions, deletions, pronoun errors and incomplete sentences are occasional consequences of this technology due to software limitations. Not all errors are caught or corrected. If there are questions or concerns about the content of this note or information contained within the body of this dictation they should be addressed directly with the author for clarification

## 2016-02-04 NOTE — Brief Op Note (Signed)
Pt refused hemo dialysis and 0800 meds with the exception of dilaudid. Administered 0.5 mg IV push.

## 2016-02-04 NOTE — Progress Notes (Signed)
Renal Progress Note    Follow up for:  ESRD    Subjective:     Again declined to go to dialysis today  Interval notes reviewed.  Had paracentesis with 5.1 L removed 10/17    ROS: no SOB, CP, F/C, N/V/D, pruritis.    Objective:     Vitals:  BP (!) 198/100 Comment: manual nurse aware  Pulse 100   Temp 98.1 F (36.7 C) (Oral)   Resp 18   Ht 1.854 m (6\' 1" )   Wt 95.1 kg (209 lb 9.6 oz)   SpO2 99%   BMI 27.65 kg/m       Wt Readings from Last 1 Encounters:   02/04/16 0509 95.1 kg (209 lb 9.6 oz)   02/01/16 0500 102.6 kg (226 lb 3.2 oz)   01/31/16 0526 101.5 kg (223 lb 13.6 oz)   01/30/16 0524 95.3 kg (210 lb 1.6 oz)   01/29/16 1410 99.7 kg (219 lb 11.2 oz)   01/28/16 0542 96.8 kg (213 lb 4.8 oz)   01/27/16 0445 99.8 kg (220 lb 1.6 oz)   01/26/16 0435 100.9 kg (222 lb 6.4 oz)   01/26/16 0358 100.9 kg (222 lb 6.4 oz)         Intake/Output Summary (Last 24 hours) at 02/04/16 1557  Last data filed at 02/04/16 1450   Gross per 24 hour   Intake              920 ml   Output                0 ml   Net              920 ml       Physical Exam:  General appearance - alert, in no distress  Chest - clear to auscultation, good respiratory effort  Heart - normal rate and regular rhythm, no rub appreciated  Abdomen - soft,  nondistended, bowel sounds normal  Extremities - trace edema  Skin-warm and dry  Access - left IJ PC, site nontender and without erythema      Labs:  Labs reviewed.  Pertinent findings below.    RECENT LABS (from the last 7 days)  Recent Labs      02/04/16   0818  02/02/16   0453   Hemoglobin  9.9*  8.4*   Hematocrit  30.1*  25.7*     No results for input(s): PT, INR, APTT in the last 72 hours.  No results for input(s): TROPI, CK, CKMBINDEX in the last 72 hours.  No results for input(s): BNP in the last 72 hours.      No results found.   Recent Labs      02/04/16   0818  02/02/16   0453   Glucose  85  107*   Sodium  139  139   Potassium  5.4*  5.4*   Chloride  101  104   CO2  24.1  21.1   BUN  59*  79*    Creatinine  9.72*  11.75*   EGFR  7*  5*   Calcium  7.9*  7.2*     No results for input(s): MG, PHOS in the last 72 hours.  No results for input(s): ALB, PROT, BILITOTAL, ALKPHOS, ALT, AST in the last 72 hours. No results for input(s): SGUR, LABPH, PROTEINUR, GLUUA, KETONESUA, BILIUA, BLOODUA, NITRITEUA, UROBILIUA, LEUA, NITRITEUA, WBCUA, RBCUA, BACTERIAUA in the last 72 hours.    Invalid input(s):  AMORPHOUSUA                   Assessment:     1. 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 On chronic hemodialysis, sporadically. Most recently on a Monday Wednesday Friday schedule  2. Nonadherence to prescribed dialysis and antihypertensive treatment regimen  3. Hyperkalemia-associated with missed dialysis treatment. Improved with hemodialysis  4. Metabolic acidosis-associated with missed dialysis treatment. Improved with hemodialysis  5. Volume overload-associated with missed dialysis treatment. Improved with hemodialysis  6. Ascites - high likelihood of uremic ascites  7. Anemia of ESRD  8. Secondary hyperparathyroidism  9. Hypertension-exacerbated by medication nonadherence as well as volume overload  10. C. difficile diarrhea      Plan:      Pt declined HD again today   Continue dialysis on usual MWF schedule.   Maintain renal diet and fluid restrictions   Continue EPO thrice weekly   Continue to encourage adherence to antihypertensive and dialysis regimen   Continue oral calcitriol (had been on this rather than IV activated vitamin D by review of previous outpatient standing orders)   Await outpatient dialysis chair assignment - case management actively working on this       Orders and Medications reviewed in Epic.    Cheral Marker, MD  3:57 PM  02/04/2016

## 2016-02-05 LAB — HEPATITIS B CORE ANTIBODY, IGM: Hepatitis B Core IgM: NONREACTIVE

## 2016-02-05 MED ORDER — DICYCLOMINE HCL 20 MG PO TABS
10.0000 mg | ORAL_TABLET | Freq: Four times a day (QID) | ORAL | Status: DC
Start: 2016-02-05 — End: 2016-02-06
  Administered 2016-02-05 – 2016-02-06 (×4): 10 mg via ORAL
  Filled 2016-02-05 (×8): qty 1

## 2016-02-05 NOTE — Progress Notes (Signed)
Date Time: 02/05/16 9:49 AM  Patient Name: [name], [name]       CSN: [id number]  Attending Physician: Jamison Oka *  Primary Care Provider: Christa See, MD    Interim summary [facility] course)     45 y.o. M with PMH of , ESRD  on hemodialysis over the past 7 years,  NON COMOPLIANT TO TRAETMENT,  anemia,   chronic systolic and diastolic congestive heart failure with LVEF of 35-40% and grade 2 diastolic dysfunction by 2-D echo in August 2017, HTN, mild intermittent asthma, tobacco dependence via cigarette smoking,WAS ADMITTED WITH     SOB AND CDAD .    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  Nephrology consulted ; started on HD   Pt has perma cath .  ID consulted     Patient continued to have same behavior as above; Refusing hemodialysis total point where he had reaccumulated ascites and requiring paracentesis which yielded 5.1 L; he again continued to have been refusing hemodialysis afterwards despite the fact that the abdominal distention is relieved. He was started on Dilaudid by mouth as planned.        SUBJECTIVE:     CC: complaints of abdominal distention    Today c/o 4 epsidoes of diarrhea  Spoke with RN and he refuses HD and his K is high so he has to get kayexalate  Asking for more pain meds even though he is sleepy    PHYSICAL EXAM:   Temp:  [98.1 F (36.7 C)-98.8 F (37.1 C)] 98.2 F (36.8 C)  Heart Rate:  [98-117] 98  Resp Rate:  [16-20] 20  BP: (154-198)/(60-102) 198/102  Body mass index is 26.78 kg/m.    Intake/Output Summary (Last 24 hours) at 02/05/16 0949  Last data filed at 02/04/16 2324   Gross per 24 hour   Intake              290 ml   Output                0 ml   Net              290 ml     Weight Monitoring 01/30/2016 01/31/2016 02/01/2016 02/02/2016 02/03/2016 02/04/2016 02/05/2016   Height - - - - - - -   Height Method - - - - - - -   Weight 95.301 kg 101.538 kg 102.604 kg  (No Data) (No Data) 95.074 kg 92.08 kg   Weight Method Actual Standing Scale Standing Scale - - Standing Scale Standing Scale   BMI (calculated) - - - - - - -         Exam:   General: moderately built,  Sleeping, arousable and hypaoctive, asked for higher dose pain meds and went back to sleep  Psychiatry: Patient is non cooperative  HEENT: neck is supple, no palpable mass  Chest: CTA bilaterally. No crackles or wheezing. No accessory muscle use   CVS: S1, S2 normal, RRR, no thrill   Abdomen: soft dsitended, nontender,no rebound or guarding, hypoactiv eBS  Musculoskeletal: No pitting edema, no cyanosis/clubbing. Expected ROM all 4 extremities. No joint swelling     MEDS: (SCHEDULED/INFUSIONS/PRN)     Current Facility-Administered Medications   Medication Dose Route Frequency   . albuterol-ipratropium  2 puff Inhalation Q6H   . amLODIPine  10 mg Oral Daily   . calcitRIOL  0.5 mcg Oral Mon-Wed-Fri   .  carvedilol  25 mg Oral Q12H SCH   . dicyclomine  20 mg Oral QID   . famotidine  20 mg Oral Daily   . furosemide  80 mg Oral Daily   . heparin (porcine)  5,000 Units Subcutaneous Q12H [facility]   . hydrALAZINE  100 mg Oral Q8H SCH   . metoclopramide  5 mg Intravenous TID AC   . nitroglycerin  1 inch Topical Q6H HOLD MN   . sevelamer  800 mg Oral TID MEALS   . vancomycin  500 mg Oral 4 times per day     Current Facility-Administered Medications   Medication Dose Route Frequency Last Rate     Current Facility-Administered Medications   Medication Dose Route   . acetaminophen  650 mg Oral    Or   . acetaminophen  650 mg per NG tube    Or   . acetaminophen  650 mg Rectal   . albuterol  2 puff Inhalation   . albuterol  2.5 mg Nebulization   . heparin (porcine)  1,000-3,000 Units Intracatheter   . hydrALAZINE  10 mg Intravenous   . HYDROmorphone  0.5 mg Intravenous   . HYDROmorphone  2 mg Oral   . naloxone  0.4 mg Intravenous   . ondansetron  8 mg Oral    Or   . ondansetron  4 mg Intravenous   . oxyCODONE-acetaminophen  1 tablet Oral     . prochlorperazine  10 mg Intravenous         LABS:       Recent Labs  Lab 02/04/16  0818 02/02/16  0453 02/01/16  0530   WBC  --   --  5.6   RBC  --   --  2.85*   Hemoglobin 9.9* 8.4* 8.7*   Hematocrit 30.1* 25.7* 26.6*   MCV  --   --  93   PLT CT  --   --  134         Recent Labs  Lab 02/04/16  0818 02/02/16  0453   Sodium 139 139   Potassium 5.4* 5.4*   Chloride 101 104   CO2 24.1 21.1   BUN 59* 79*   Creatinine 9.72* 11.75*   Glucose 85 107*   EGFR 7* 5*   Calcium 7.9* 7.2*                                No results found for: [id number]      IMAGING STUDIES:   Ct Abdomen Pelvis Dry  (stone)    Result Date: 01/26/2016  Volume overload with cardiomegaly, ascites, edema, anasarca. Complex ascites. ReadingStation:[email or web address]    Xr Chest 2 Views    Result Date: 01/31/2016  1.  NO SIGNIFICANT CHANGE FROM 5 DAYS PRIOR. 2.  PERSISTENT SMALL EFFUSIONS AND MILD PULMONARY EDEMA. 3.  DEVELOPING RIGHT LUNG BASE CONSOLIDATION NOT EXCLUDED, THOUGH FELT DUE TO EDEMA. ReadingStation:[email or web address]    Ct Soft Tissue Neck Wo Contrast    Result Date: 02/02/2016  No enlargement of the adenoids or tonsils identified. Scattered small nodes throughout the neck most prominent in the right supraclavicular region. Findings probably represent reactive change. Collateral vessels within the neck, which may related to underlying venous occlusions. ReadingStation:[email or web address]    Nm Lung Ventilation Perfusion Aerosol    Result Date: 01/08/2016   Low probability for pulmonary embolism. Collene Schlichter, MD 01/08/2016 9:32 AM  US Abdomen Limited Ruq    Result Date: 01/29/2016  1. Heterogeneous liver parenchyma likely related to underlying nonspecific medical liver disease. 2. Small volume of abdominal ascites with the largest pocket of fluid in the right upper quadrant. 3. Gallbladder absent. 4. Atrophic echogenic right kidney corresponding to chronic medical renal disease. ReadingStation:[email or web address]    Xr Chest Ap Portable    Result Date: 01/26/2016  Left  IJ double lumen dialysis catheter tip at superior cavoatrial junction. Right subclavian vascular stent. Cardiomegaly. Mild pulmonary edema. No focal consolidation or suspicious infiltrates. Trace pleural effusions. ReadingStation:[email or web address]    US Venous Duplex Doppler Leg Bilateral    Result Date: 01/07/2016   No evidence for DVT. Charlott Rakes, MD 01/07/2016 2:43 PM     Xr Abdomen Portable    Result Date: 02/04/2016  No bowel dilatation or obstruction. ReadingStation:[email or web address]    Xr Abdomen Portable    Result Date: 01/30/2016  FINDINGS/IMPRESSION: 1.  PAUCITY OF BOWEL GAS SEEN; CONSISTENT WITH FLUID-FILLED LARGE AND SMALL BOWEL LOOPS AS ON PRIOR CT. 2.  HAZINESS CONSISTENT WITH ASCITES. 3.  NO ABNORMAL ABDOMINAL CALCIFICATIONS. 4.  NO ACUTE BONY ABNORMALITY. ReadingStation:[email or web address]    US Abdomen Limited Single Organ    Result Date: 02/02/2016  FINDINGS/IMPRESSION: LARGE ASCITES; LARGEST POCKET RLQ. ReadingStation:[email or web address]    US Paracentesis    Result Date: 02/03/2016  Successful ultrasound guided drainage catheter insertion and subsequent large volume paracentesis. ReadingStation:[email or web address]      MICROBIOLOGY AND/OR TELEMETRY:       Stool + c diff     ASSESSMENT AND PLAN:     SOB , ascites anasarca sec to   Volume overload from missed hemodialysis in patient with ESRD on HD   Due to medical non-compliance;  Nephrology following   Made him aware that his ascites likely related to his missed HD, he doest not believe so  I encouraged him to stay with his scheduled HD to prevent reaccumulation of ascites  No dyspnea today, off o2         Recurrent Clostridium difficile diarrhea  Appeared to have resolved with solid bowel movement, continue Vanco by mouth    he can follow up in [facility] as he chooses   now diarrhea bujt likely 2/2 kayexalate  Also on reglan that I will stop  Will decerase bentyl from 20 q6 to 10mg       Accelerated essential HTN  Due to medical non-compliance  No further adjustment since patient is  noncompliant    Systolic and diastolic CHF, chronic    Intake and output monitoring, daily weight  Telemetry monitoring  cont current meds   Cont HD as scheduled        Mild intermittent asthma with active tobacco smoking  Stable without exacerbation   Cont current meds     Recurrent ascites,  status post paracentesis    Hyperkalemia, refusing HD, discussed his risk of arrhythmia and death    Hematochezia,stable HH        DVT Prophylaxis: Heparin    CODE Status : FULL CODE    Disposition: very difficult care of a noncompliant patient; need to arrange for HD chair, will avoid iv dilaudid      Signed by: Roanna Epley, MD  [address]  [place] [phone]  Office Ph: [phone]    Please contact at phone number [phone] if any questions.

## 2016-02-05 NOTE — Progress Notes (Signed)
Renal Progress Note    Follow up for:  ESRD    Subjective:     No new complaints today  Interval notes reviewed.  Had paracentesis with 5.1 L removed 10/17    ROS: no SOB, CP, F/C, N/V/D, pruritis.    Objective:     Vitals:  BP (!) 198/102 Comment: manual  Pulse 98   Temp 98.2 F (36.8 C) (Oral)   Resp 20   Ht 1.854 m (6\' 1" )   Wt 92.1 kg (203 lb)   SpO2 100%   BMI 26.78 kg/m       Wt Readings from Last 1 Encounters:   02/05/16 0556 92.1 kg (203 lb)   02/04/16 0509 95.1 kg (209 lb 9.6 oz)   02/01/16 0500 102.6 kg (226 lb 3.2 oz)   01/31/16 0526 101.5 kg (223 lb 13.6 oz)   01/30/16 0524 95.3 kg (210 lb 1.6 oz)   01/29/16 1410 99.7 kg (219 lb 11.2 oz)   01/28/16 0542 96.8 kg (213 lb 4.8 oz)   01/27/16 0445 99.8 kg (220 lb 1.6 oz)   01/26/16 0435 100.9 kg (222 lb 6.4 oz)   01/26/16 0358 100.9 kg (222 lb 6.4 oz)         Intake/Output Summary (Last 24 hours) at 02/05/16 1342  Last data filed at 02/04/16 2324   Gross per 24 hour   Intake              290 ml   Output                0 ml   Net              290 ml       Physical Exam:  General appearance - alert, in no distress  Chest - clear to auscultation, good respiratory effort  Heart - normal rate and regular rhythm, no rub appreciated  Abdomen - soft,  nondistended, bowel sounds normal  Extremities - trace edema  Skin-warm and dry  Access - left IJ PC, site nontender and without erythema      Labs:  Labs reviewed.  Pertinent findings below.    RECENT LABS (from the last 7 days)  Recent Labs      02/04/16   0818   Hemoglobin  9.9*   Hematocrit  30.1*     No results for input(s): PT, INR, APTT in the last 72 hours.  No results for input(s): TROPI, CK, CKMBINDEX in the last 72 hours.  No results for input(s): BNP in the last 72 hours.      Xr Abdomen Portable    Result Date: 02/04/2016  No bowel dilatation or obstruction. ReadingStation:[email or web address]     Recent Labs      02/04/16   0818   Glucose  85   Sodium  139   Potassium  5.4*   Chloride  101   CO2  24.1   BUN   59*   Creatinine  9.72*   EGFR  7*   Calcium  7.9*     No results for input(s): MG, PHOS in the last 72 hours.  No results for input(s): ALB, PROT, BILITOTAL, ALKPHOS, ALT, AST in the last 72 hours. No results for input(s): SGUR, LABPH, PROTEINUR, GLUUA, KETONESUA, BILIUA, BLOODUA, NITRITEUA, UROBILIUA, LEUA, NITRITEUA, WBCUA, RBCUA, BACTERIAUA in the last 72 hours.    Invalid input(s):  AMORPHOUSUA  Assessment:     1. 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 On chronic hemodialysis, sporadically. Most recently on a Monday Wednesday Friday schedule  2. Nonadherence to prescribed dialysis and antihypertensive treatment regimen  3. Hyperkalemia-associated with missed dialysis treatment. Improved with hemodialysis  4. Metabolic acidosis-associated with missed dialysis treatment. Improved with hemodialysis  5. Volume overload-associated with missed dialysis treatment. Improved with hemodialysis  6. Ascites - high likelihood of uremic ascites  7. Anemia of ESRD  8. Secondary hyperparathyroidism  9. Hypertension-exacerbated by medication nonadherence as well as volume overload  10. C. difficile diarrhea      Plan:      No changes today.   Continue dialysis on usual MWF schedule.   Maintain renal diet and fluid restrictions   Continue EPO thrice weekly   Continue to encourage adherence to antihypertensive and dialysis regimen   Continue oral calcitriol (had been on this rather than IV activated vitamin D by review of previous outpatient standing orders)   Await outpatient dialysis chair assignment - case management actively working on this       Orders and Medications reviewed in Epic.    Cheral Marker, MD  1:42 PM  02/05/2016

## 2016-02-05 NOTE — Plan of Care (Signed)
Problem: Renal Instability  Goal: Fluid and electrolyte balance are achieved/maintained  Outcome: Progressing  Pt up independently in room. Refused some BP meds this AM. Pt attempted to go home today, wanted a discharge order so he could get a taxi voucher. Explained to pt that MD is not able to safely discharge him until the pt secures an HD chair outpatient. Pt eventually decided to stay for the evening. Will continue to monitor pt closely.           02/04/16 1153   Goal/Interventions addressed this shift   Fluid and electrolyte balance are achieved/maintained  Monitor intake and output every shift;Monitor/assess lab values and report abnormal values;Provide adequate hydration;Assess for confusion/personality changes;Assess and reassess fluid and electrolyte status;Observe for seizure activity and initiate seizure precautions if indicated;Observe for cardiac arrhythmias;Monitor for muscle weakness;Follow fluid restrictions/IV/PO parameters

## 2016-02-05 NOTE — Progress Notes (Addendum)
02/05/16 1544   Case Management Quick Doc   Case Management Assessment Status Assessment Complete   CM Comments 10/19 RNCM-ESRD working on securing outpt HD chair barriers d/t non compliance.  [facility] declined pt.  [facility] in [place] last assigned chair, would like pt to speak to [facility] prior to accepting. pt declined last week to talk with Raynelle Fanning.  CM met with pt and pt did call Raynelle Fanning re chair.  infor faxed to [facility] Raynelle Fanning aware.  Raynelle Fanning will discuss with manager re offering pt chair again. pt has a hx of noncompliance.  pt requesting cab voucher to [facility] where his car is located.  psych and palliative care have consulted on pt  this visit.  cm following.      CM reviewed chart, discussed pt in team time and with Dr. Lajean Saver.  CM received call from [place] at [facility] this am informing CM pt needed to call and speak with Raynelle Fanning at [facility] in [place] today or will discharge him. [facility] has already delcined pt.  CM discussed with pt and phone number provided pt called Raynelle Fanning. [phone]. Pt switched to po pain meds today.  Requesting cab at [place] [facility] where his car is located he has no family or friends available to drive him there.  RN aware that cab voucher needed when pt ready for [place].  Awaiting call back from [place] at [facility] to confirm if chair available for pt after she discusses with Interior and spatial designer.     Addendum:  5:08-discussed with Dr. Marcos Eke, Cm to follow up in am with Raynelle Fanning re if chair available.  RN updated and reports will let pt know.       Trinna Balloon RNCM  Ext [phone]

## 2016-02-06 LAB — HEMOGLOBIN AND HEMATOCRIT, BLOOD
Hematocrit: 25.9 % — ABNORMAL LOW (ref 39.0–52.5)
Hemoglobin: 8.6 gm/dL — ABNORMAL LOW (ref 13.0–17.5)

## 2016-02-06 LAB — BASIC METABOLIC PANEL
Anion Gap: 20.6 mMol/L — ABNORMAL HIGH (ref 7.0–18.0)
BUN / Creatinine Ratio: 5.7 Ratio — ABNORMAL LOW (ref 10.0–30.0)
BUN: 69 mg/dL — ABNORMAL HIGH (ref 7–22)
CO2: 20 mMol/L (ref 20.0–30.0)
Calcium: 7.3 mg/dL — ABNORMAL LOW (ref 8.5–10.5)
Chloride: 100 mMol/L (ref 98–110)
Creatinine: 12.09 mg/dL — ABNORMAL HIGH (ref 0.80–1.30)
EGFR: 5 mL/min/{1.73_m2} — ABNORMAL LOW (ref 60–150)
Glucose: 74 mg/dL (ref 70–99)
Osmolality Calc: 291 mOsm/kg (ref 275–300)
Potassium: 4.6 mMol/L (ref 3.5–5.3)
Sodium: 136 mMol/L (ref 136–147)

## 2016-02-06 MED ORDER — HYDROMORPHONE HCL 2 MG PO TABS
1.0000 mg | ORAL_TABLET | ORAL | Status: DC | PRN
Start: 2016-02-06 — End: 2016-02-06
  Administered 2016-02-06: 1 mg via ORAL
  Filled 2016-02-06: qty 1

## 2016-02-06 MED ORDER — ZOLPIDEM TARTRATE 5 MG PO TABS
10.0000 mg | ORAL_TABLET | Freq: Once | ORAL | Status: AC
Start: 2016-02-06 — End: 2016-02-06
  Administered 2016-02-06: 10 mg via ORAL
  Filled 2016-02-06: qty 2

## 2016-02-06 MED ORDER — FIRST-VANCOMYCIN 50 50 MG/ML PO SOLN
250.0000 mg | Freq: Four times a day (QID) | ORAL | Status: DC
Start: 2016-02-06 — End: 2016-02-06
  Administered 2016-02-06: 250 mg via ORAL
  Filled 2016-02-06 (×4): qty 5

## 2016-02-06 MED ORDER — CALCITRIOL 0.5 MCG PO CAPS
0.5000 ug | ORAL_CAPSULE | ORAL | 0 refills | Status: DC
Start: 2016-02-09 — End: 2016-05-04

## 2016-02-06 MED ORDER — FIRST-VANCOMYCIN 50 50 MG/ML PO SOLN
250.0000 mg | Freq: Four times a day (QID) | ORAL | 0 refills | Status: DC
Start: 2016-02-06 — End: 2016-05-04

## 2016-02-06 MED ORDER — HEPARIN SODIUM (PORCINE) 1000 UNIT/ML IJ SOLN
INTRAMUSCULAR | Status: AC
Start: 2016-02-06 — End: ?
  Filled 2016-02-06: qty 10

## 2016-02-06 NOTE — Plan of Care (Signed)
Problem: Renal Instability  Goal: Fluid and electrolyte balance are achieved/maintained  Outcome: Adequate for Discharge  Pt continues to refuse all BP meds until after completing dialysis. MD aware. Permacath site clean, dry, and intact.           02/06/16 0348   Goal/Interventions addressed this shift   Fluid and electrolyte balance are achieved/maintained  Monitor intake and output every shift;Monitor/assess lab values and report abnormal values;Provide adequate hydration;Monitor daily weight;Assess for confusion/personality changes;Assess and reassess fluid and electrolyte status;Observe for seizure activity and initiate seizure precautions if indicated;Monitor for muscle weakness;Observe for cardiac arrhythmias;Follow fluid restrictions/IV/PO parameters

## 2016-02-06 NOTE — Progress Notes (Signed)
Renal Progress Note    Follow up for:  ESRD    Subjective:     Seen on HD.    He is tolerating dialysis well so far.    Resting comfortably.  Objective:     Vitals:  BP (!) 190/107   Pulse (!) 101   Temp 98.3 F (36.8 C)   Resp 18   Ht 1.854 m (6\' 1" )   Wt 92.1 kg (203 lb)   SpO2 100%   BMI 26.78 kg/m       Wt Readings from Last 1 Encounters:   02/05/16 0556 92.1 kg (203 lb)   02/04/16 0509 95.1 kg (209 lb 9.6 oz)   02/01/16 0500 102.6 kg (226 lb 3.2 oz)   01/31/16 0526 101.5 kg (223 lb 13.6 oz)   01/30/16 0524 95.3 kg (210 lb 1.6 oz)   01/29/16 1410 99.7 kg (219 lb 11.2 oz)   01/28/16 0542 96.8 kg (213 lb 4.8 oz)   01/27/16 0445 99.8 kg (220 lb 1.6 oz)   01/26/16 0435 100.9 kg (222 lb 6.4 oz)   01/26/16 0358 100.9 kg (222 lb 6.4 oz)         Intake/Output Summary (Last 24 hours) at 02/06/16 1520  Last data filed at 02/06/16 1427   Gross per 24 hour   Intake              550 ml   Output                0 ml   Net              550 ml       Physical Exam:  General appearance - alert, in no distress  Chest - clear to auscultation  Heart - normal rate and regular rhythm  Abdomen - soft,  nondistended  Extremities - no edema  Skin-warm and dry  Access - left IJ PC      Labs:  Labs reviewed.  Pertinent findings below.    RECENT LABS (from the last 7 days)  Recent Labs      02/06/16   0533  02/04/16   0818   Hemoglobin  8.6*  9.9*   Hematocrit  25.9*  30.1*     No results for input(s): PT, INR, APTT in the last 72 hours.  No results for input(s): TROPI, CK, CKMBINDEX in the last 72 hours.  No results for input(s): BNP in the last 72 hours.      No results found.   Recent Labs      02/06/16   0533  02/04/16   0818   Glucose  74  85   Sodium  136  139   Potassium  4.6  5.4*   Chloride  100  101   CO2  20.0  24.1   BUN  69*  59*   Creatinine  12.09*  9.72*   EGFR  5*  7*   Calcium  7.3*  7.9*     No results for input(s): MG, PHOS in the last 72 hours.  No results for input(s): ALB, PROT, BILITOTAL, ALKPHOS, ALT, AST in  the last 72 hours. No results for input(s): SGUR, LABPH, PROTEINUR, GLUUA, KETONESUA, BILIUA, BLOODUA, NITRITEUA, UROBILIUA, LEUA, NITRITEUA, WBCUA, RBCUA, BACTERIAUA in the last 72 hours.    Invalid input(s):  AMORPHOUSUA                   Assessment:  1. 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 On chronic hemodialysis, sporadically. Most recently on a Monday Wednesday Friday schedule  2. Nonadherence to prescribed dialysis and antihypertensive treatment regimen  3. Hyperkalemia-associated with missed dialysis treatment. Improved with hemodialysis  4. Metabolic acidosis-associated with missed dialysis treatment. Improved with hemodialysis  5. Volume overload-associated with missed dialysis treatment. Improved with hemodialysis  6. Ascites - Status post paracenteses  7. Anemia of ESRD  8. Secondary hyperparathyroidism  9. Hypertension-exacerbated by medication nonadherence as well as volume overload  10. C. difficile diarrhea      Plan:      Continue dialysis on usual MWF schedule.   Maintain renal diet and fluid restrictions   Continue EPO thrice weekly   Continue to encourage adherence to antihypertensive and dialysis regimen   Continue oral calcitriol (had been on this rather than IV activated vitamin D by review of previous outpatient standing orders)   Await outpatient dialysis chair assignment - case management following      Orders and Medications reviewed in Epic.    Cherie Ouch, MD  3:20 PM  02/06/2016

## 2016-02-06 NOTE — Progress Notes (Signed)
The Palliative Care team will sign-off with the following recommendations:  1) pt will need a regular PCP once discharged in order to manage pt's multiple issues  2) PCP should work with pt on obtaining fecal transplant if possible.     Thank you for this consult; if the Palliative Care team can be of further service, please re-consult [place].

## 2016-02-06 NOTE — Progress Notes (Signed)
Pt back from dialysis, refused all BP meds today. Gave pt coreg, renvella, and calcitriol this AM and found these medications in the pt's bed when changing the sheets. D/C instructions given, IV access was removed, all questions answered. Ordered transport, pt requesting to wait downstairs until taxi gets here.

## 2016-02-06 NOTE — Progress Notes (Signed)
Date Time: 02/06/16 9:30 AM  Patient Name: [name], [name]       CSN: [id number]  Attending Physician: Jamison Oka *  Primary Care Provider: Christa See, MD    Interim summary [facility] course)     45 y.o. M with PMH of , ESRD  on hemodialysis over the past 7 years,  NON COMOPLIANT with TREATMENT,  anemia,   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  Nephrology consulted ; started on HD   Pt has perma cath .  ID consulted     Patient continued to have same behavior as above; Refusing hemodialysis total point where he had reaccumulated ascites and requiring paracentesis which yielded 5.1 L; he again continued to have been refusing hemodialysis afterwards despite the fact that the abdominal distention is relieved. He was started on Dilaudid by mouth as planned.        SUBJECTIVE:          Still c/o diarrhea but no evidence of that as per RN  Last night he had ambien and sleepy this am  Refusing most of BP meds    PHYSICAL EXAM:   Temp:  [98 F (36.7 C)-98.6 F (37 C)] 98.2 F (36.8 C)  Heart Rate:  [105] 105  Resp Rate:  [18] 18  BP: (158-200)/(80-98) 194/98  Body mass index is 26.78 kg/m.    Intake/Output Summary (Last 24 hours) at 02/06/16 0930  Last data filed at 02/06/16 0019   Gross per 24 hour   Intake              690 ml   Output                0 ml   Net              690 ml     Weight Monitoring 01/31/2016 02/01/2016 02/02/2016 02/03/2016 02/04/2016 02/05/2016 02/06/2016   Height - - - - - - -   Height Method - - - - - - -   Weight 101.538 kg 102.604 kg (No Data) (No Data) 95.074 kg 92.08 kg (No Data)   Weight Method Standing Scale Standing Scale - -  Standing Scale Standing Scale -   BMI (calculated) - - - - - - -         Exam:   General: moderately built,  Sleeping, arousable and hypaoctive, asked for   pain meds and went back to sleep, non cooperative  Psychiatry: Patient is non cooperative  HEENT: neck is supple, no palpable mass  Chest: CTA bilaterally. No crackles or wheezing. No accessory muscle use   CVS: S1, S2 normal, RRR, no thrill   Abdomen: soft dsitended, nontender,no rebound or guarding, hypoactiv eBS  Musculoskeletal: No pitting edema, no cyanosis/clubbing. Expected ROM all 4 extremities. No joint swelling     MEDS: (SCHEDULED/INFUSIONS/PRN)     Current Facility-Administered Medications   Medication Dose Route Frequency   . amLODIPine  10 mg Oral Daily   . calcitRIOL  0.5 mcg Oral Mon-Wed-Fri   . carvedilol  25 mg Oral Q12H SCH   . dicyclomine  10 mg Oral QID   . famotidine  20 mg Oral Daily   . furosemide  80 mg Oral Daily   . heparin (porcine)  5,000 Units Subcutaneous Q12H [facility]   . hydrALAZINE  100 mg Oral Q8H SCH   . sevelamer  800 mg Oral TID MEALS   . vancomycin  250 mg Oral 4 times per day     Current Facility-Administered Medications   Medication Dose Route Frequency Last Rate     Current Facility-Administered Medications   Medication Dose Route   . acetaminophen  650 mg Oral    Or   . acetaminophen  650 mg per NG tube    Or   . acetaminophen  650 mg Rectal   . albuterol  2 puff Inhalation   . albuterol  2.5 mg Nebulization   . heparin (porcine)  1,000-3,000 Units Intracatheter   . hydrALAZINE  10 mg Intravenous   . HYDROmorphone  2 mg Oral   . naloxone  0.4 mg Intravenous   . ondansetron  8 mg Oral    Or   . ondansetron  4 mg Intravenous   . prochlorperazine  10 mg Intravenous         LABS:       Recent Labs  Lab 02/06/16  0533 02/04/16  0818  02/01/16  0530   WBC  --   --   --  5.6   RBC  --   --   --  2.85*   Hemoglobin 8.6* 9.9* More results in Results Review 8.7*   Hematocrit 25.9* 30.1* More results in Results Review 26.6*   MCV  --    --   --  93   PLT CT  --   --   --  134   More results in Results Review = values in this interval not displayed.      Recent Labs  Lab 02/06/16  0533 02/04/16  0818   Sodium 136 139   Potassium 4.6 5.4*   Chloride 100 101   CO2 20.0 24.1   BUN 69* 59*   Creatinine 12.09* 9.72*   Glucose 74 85   EGFR 5* 7*   Calcium 7.3* 7.9*          IMAGING STUDIES:   Ct Abdomen Pelvis Dry  (stone)    Result Date: 01/26/2016  Volume overload with cardiomegaly, ascites, edema, anasarca. Complex ascites. ReadingStation:[email or web address]    Xr Chest 2 Views    Result Date: 01/31/2016  1.  NO SIGNIFICANT CHANGE FROM 5 DAYS PRIOR. 2.  PERSISTENT SMALL EFFUSIONS AND MILD PULMONARY EDEMA. 3.  DEVELOPING RIGHT LUNG BASE CONSOLIDATION NOT EXCLUDED, THOUGH FELT DUE TO EDEMA. ReadingStation:[email or web address]    Ct Soft Tissue Neck Wo Contrast    Result Date: 02/02/2016  No enlargement of the adenoids or tonsils identified. Scattered small nodes throughout the neck most prominent in the right supraclavicular region. Findings probably represent reactive change. Collateral vessels within the neck, which may related to underlying venous occlusions. ReadingStation:[email or web address]    Nm Lung Ventilation Perfusion Aerosol    Result Date: 01/08/2016   Low probability for pulmonary embolism. Collene Schlichter, MD 01/08/2016 9:32 AM     US Abdomen Limited Ruq    Result Date: 01/29/2016  1. Heterogeneous liver parenchyma likely related to underlying nonspecific medical liver disease. 2. Small volume of abdominal ascites with the largest pocket of fluid in the right upper quadrant. 3. Gallbladder absent. 4. Atrophic echogenic right kidney corresponding to chronic medical renal disease. ReadingStation:[email or web address]  Xr Chest Ap Portable    Result Date: 01/26/2016  Left IJ double lumen dialysis catheter tip at superior cavoatrial junction. Right subclavian vascular stent. Cardiomegaly. Mild pulmonary edema. No focal consolidation or suspicious infiltrates. Trace pleural effusions.  ReadingStation:[email or web address]    US Venous Duplex Doppler Leg Bilateral    Result Date: 01/07/2016   No evidence for DVT. Charlott Rakes, MD 01/07/2016 2:43 PM     Xr Abdomen Portable    Result Date: 02/04/2016  No bowel dilatation or obstruction. ReadingStation:[email or web address]    Xr Abdomen Portable    Result Date: 01/30/2016  FINDINGS/IMPRESSION: 1.  PAUCITY OF BOWEL GAS SEEN; CONSISTENT WITH FLUID-FILLED LARGE AND SMALL BOWEL LOOPS AS ON PRIOR CT. 2.  HAZINESS CONSISTENT WITH ASCITES. 3.  NO ABNORMAL ABDOMINAL CALCIFICATIONS. 4.  NO ACUTE BONY ABNORMALITY. ReadingStation:[email or web address]    US Abdomen Limited Single Organ    Result Date: 02/02/2016  FINDINGS/IMPRESSION: LARGE ASCITES; LARGEST POCKET RLQ. ReadingStation:[email or web address]    US Paracentesis    Result Date: 02/03/2016  Successful ultrasound guided drainage catheter insertion and subsequent large volume paracentesis. ReadingStation:[email or web address]      MICROBIOLOGY AND/OR TELEMETRY:       Stool + c diff     ASSESSMENT AND PLAN:     Dyspnea, fluid overload  Volume overload from missed hemodialysis in patient with ESRD on HD   Due to medical non-compliance;  Nephrology following   Made him aware that his ascites likely related to his missed HD, he doest not believe so  I encouraged him to stay with his scheduled HD to prevent reaccumulation of ascites  No dyspnea today, off o2  Hard to manage volume, agrees to do HD today         Recurrent Clostridium difficile diarrhea   unable to see if he has diarrhea or not, poor historian, has drug seeking behavior  reglan stopped, bentyl decreased  On po vanco day 10, decrease from 500 to 250 qid      Accelerated essential HTN  Due to medical non-compliance  No further adjustment since patient is noncompliant    Systolic and diastolic CHF,   Acute exacerbationo due to lack of HD compliancechronic          Mild intermittent asthma with active tobacco smoking  Stable without exacerbation   Cont current meds     Recurrent ascites,  status post  paracentesis    Hyperkalemia,   refusing HD, attending discussed his risk of arrhythmia and death    Hematochezia,stable HH        DVT Prophylaxis: Heparin    CODE Status : FULL CODE    Disposition:  medicallystable for d/c, neds HD place , wean off po vanco, decrease dilaudid    Signed by: Roanna Epley, MD  [address]  [place] [id number]  Office Ph: [phone]    Please contact at phone number [phone] if any questions.

## 2016-02-06 NOTE — Plan of Care (Addendum)
Problem: Renal Instability  Goal: Fluid and electrolyte balance are achieved/maintained  Outcome: Progressing   02/06/16 0348   Goal/Interventions addressed this shift   Fluid and electrolyte balance are achieved/maintained  Monitor intake and output every shift;Monitor/assess lab values and report abnormal values;Provide adequate hydration;Monitor daily weight;Assess for confusion/personality changes;Assess and reassess fluid and electrolyte status;Observe for seizure activity and initiate seizure precautions if indicated;Monitor for muscle weakness;Observe for cardiac arrhythmias;Follow fluid restrictions/IV/PO parameters       Comments: Assumed pt care at 1900. Pt c/o abd pain. Pt requested MD be notified and asked for IV dilaudid. MD denied this request. Pt took PRN oral dilaudid. Pt BP elevated, MD notified. MD said to give BP medications and watch BP. Pt complained of increased abd pain and nausea. VS obtained and pt given PRN PO percocet. Pt IV placed by VAT unable to be flushed. Pt offered oral Zofran, pt denied.  New IV placed by RRT RN. Pt requested something to be able to sleep. MD ordered one time dose of oral Ambien 10mg  for pt. Pt rang at 0300 for pain medication. When pain medication brought, pt asleep. Pt woke and rang for pain medication again. PRN dilaudid given at 0300.     0400: Pt appears forgetful. Pt asked for pain medication again at this time. Pt informed that pain medication was already given. Will continue to monitor.     0600: Pt asked for PO dilaudid again stating that he got percocet instead of dilaudid. Pt asked RN to "please check charts to make sure (he) wasn't due for more dilaudid.

## 2016-02-06 NOTE — Progress Notes (Addendum)
02/06/16 1315   Case Management Quick Doc   Case Management Assessment Status Assessment Complete   CM Comments 02/06/16 RNCM: ESRD-HD M-W-F. Spoke with Raynelle Fanning [phone] at [facility]. Pt can have a chair if he calls on Monday. However, since he is a no show much of the time and has not been at their facility since June they will not assign him a chair time. He will need to call each M-W-F to be given a chair time. Pt will need a taxi voucher to his car at [facility].    Expected Discharge Date 02/06/16   Physical Discharge Disposition Home with Needs  (HD M-W-F [facility] in [place])   Discharge Disposition   Patient/Family/POA notified of transfer plan Patient informed only   Mode of Transportation Taxi Voucher   Bedside nurse notified of transport plan? Yes   Patient agreeable to discharge plan/expected d/c date? Yes     Addendum: [facility] Discharge Prescription Program form placed on shadow chart.    Marge Duncans RN, BSN/Case Manager  [phone]

## 2016-02-06 NOTE — Progress Notes (Signed)
Dialysis Treatment Note    Patient:  [name] MRN#:  [id number]  Unit/Room/Bed:  540/981-X   Isolation: Contact Special       Allergies: Allergies   Allergen Reactions   . Lisinopril Anaphylaxis   . Toradol [Ketorolac Tromethamine] Anaphylaxis   . Morphine Other (See Comments)     "jittery" per patient, "I don't like the way it makes me feel". Previously charted anaphylaxis, but patient said just "jumpy, jittery"   . Percocet [Oxycodone-Acetaminophen] Other (See Comments)     "jittery" per patient, "I don't like the way it makes me feel"     Code Status: Full Code    Problem List:   Patient Active Problem List    Diagnosis Date Noted   . Psychological factors affecting medical condition 02/04/2016   . ESRD needing dialysis 01/26/2016   . Clostridium difficile infection 01/26/2016   . Cardiomyopathy    . Cardiac LV ejection fraction 21-40% 01/07/2016   . Chest pain, unspecified type 01/06/2016   . Chest pain in adult 12/30/2015   . Acute on chronic congestive heart failure 12/30/2015   . Drug-seeking behavior 12/18/2015   . HAP (hospital-acquired pneumonia) 12/17/2015   . Diarrhea 12/17/2015   . Hospital-acquired pneumonia    . Nausea & vomiting 11/09/2015   . Decreased appetite 11/09/2015   . Ascites 11/09/2015   . Diarrhea of infectious origin 11/09/2015   . Acute pain due to trauma 11/17/2013   . Pulmonary contusion, initial encounter 11/17/2013   . 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 Hypertension 11/17/2013       Dialysis Order:  Active Orders   Dialysis    Hemodialysis inpatient     Frequency: Once     Start Date/Time: 02/04/16 0000     Number of Occurrences:  1 Occurrences     Order Questions:     . Date of Dialysis 02/04/2016     . K+ (initial) 2 mEq     . KPP (Per Protocol) Call MD     . Ca++ 2.5 mEq     . Bicarb 35 mEq     . Na+ 138 mEq     . Dialyzer ReviClear     . Dialysate Temperature (C) 37     . BFR-As tolerated to a maximum of: 400 mL/min     . DFR 600 mL/min     . Duration of  Treatment 3.5 Hours     . Fluid Removal (L) and Dry Weight (Kg) Dry Weight (please specify) (Comment: 97 kg)     . Limit Ultrafiltration Rate Yes     . Specify mL/hr/kg 13     . Access Type-Location Tunneled-L    Hemodialysis inpatient     Frequency: Once     Start Date/Time: 02/06/16 0000     Number of Occurrences:  1 Occurrences     Order Questions:     . Date of Dialysis 02/06/2016     . K+ (initial) 2 mEq     . KPP (Per Protocol) Call MD     . Ca++ 2.5 mEq     . Bicarb 35 mEq     . Na+ 138 mEq     . Dialyzer ReviClear     . Dialysate Temperature (C) 37     . BFR-As tolerated to a maximum of: 400 mL/min     . DFR 600 mL/min     . Duration of Treatment 3.5  Hours     . Fluid Removal (L) and Dry Weight (Kg) Dry Weight (please specify) (Comment: 97 kg)     . Limit Ultrafiltration Rate Yes     . Specify mL/hr/kg 13     . Access Type-Location Tunneled-L        Dialysis Treatment Type:    Time out/Safety Check: Time Out/Safety Check Completed: Yes (02/06/16 1305)  [facility] Consent: Consent for HD signed for this hospitalization: Yes (02/06/16 1305)  Blood Consent (if applicable): Blood Consent Verified: Not Applicable (02/06/16 1305)    Dialysis Access:  Permacath/Temporary Catheter Permacath Left (Active)   Line necessity reviewed? Dialysis 02/06/2016  1:34 PM   Catheter Lumen Volume Venous 1.9 mL 02/06/2016  4:10 PM   Catheter Lumen Volume Arterial 1.8 mL 02/06/2016  4:10 PM   Dressing Status and Intervention New Dressing 02/06/2016  4:10 PM   [place] Caps on Catheter Yes 02/02/2016  5:50 PM   NEW Tego Caps placed (Date) 02/02/16 02/02/2016  5:50 PM   Dressing Type Antimicrobial impregnated 02/06/2016  1:34 PM   Dressing Status Clean;Dry;Intact 02/06/2016  1:34 PM   Dressing Change Due 02/09/16 02/06/2016  1:34 PM           General Assessments:   02/06/16 1305   Vitals   Temp 98.3 F (36.8 C)   Heart Rate 98   Resp Rate 18   BP (!) 158/91   O2 Device Nasal cannula   Assessment   Mental Status  Alert;Oriented;Cooperative   Respiratory  WDL   Respiratory Pattern Regular;Unlabored   Bilateral Breath Sounds Diminished   Edema  WDL   General Skin Color Appropriate for ethnicity   Skin Condition/Temp Warm;Dry   Permacath/Temporary Catheter Permacath Left   No Placement Date or Time found.   Access Type: Permacath  Orientation: Left  Access Location: Tunneled, Chest  Central Line Infection Prevention Education provided?: Yes   Dressing Status and Intervention Dressing Intact;Clean & Dry   Pain Assessment   Charting Type Assessment   Pain Scale Used Numeric Scale (0-10)   Numeric Pain Scale   Pain Score 2   POSS Score 2   Hemodialysis Comments   Pre-Hemodialysis Comments HD orders and labs reviewed. Vital signs obtained. Assessment complete.        02/06/16 1610   Treatment Summary   Time Off Machine 1600   Duration of Treatment (Hours) 3   Dialyzer Clearance Lightly streaked   Fluid Volume Off (mL) 2490   Prime Volume (mL) 200   Rinseback Volume (mL) 200   Fluid Given: Normal Saline (mL) 0   Fluid Given: PRBC  0 mL   Fluid Given: Albumin (mL) 0   Fluid Given: Other (mL) 0   Total Fluid Given 400   Hemodialysis Net Fluid Removed 2090   Post Treatment Assessment   Post-Treatment Weight (Kg) 90.1   Patient Response to Treatment Tolerated well   Additional Dialyzer Used No   Permacath/Temporary Catheter Permacath Left   No Placement Date or Time found.   Access Type: Permacath  Orientation: Left  Access Location: Tunneled, Chest  Central Line Infection Prevention Education provided?: Yes   Catheter Lumen Volume Venous 1.9 mL   Catheter Lumen Volume Arterial 1.8 mL   Dressing Status and Intervention New Dressing   Vitals   Heart Rate 100   Resp Rate 18   BP (!) 180/101   O2 Device Nasal cannula   Assessment   Mental Status Alert;Oriented;Cooperative  Respiratory  WDL   Respiratory Pattern Regular;Unlabored   Bilateral Breath Sounds Diminished   Edema  WDL   General Skin Color Appropriate for ethnicity   Skin  Condition/Temp Warm;Dry   Education   Person taught Patient   Knowledge basis Substantial   Topics taught Procedure;Fluid Management   Teaching Tools Explain   Reponse Verbalizes Understanding   Bedside Nurse Communication   Name of bedside RN - post dialysis H. Clem, RN           Pain Assessment: Pain Assessment  Charting Type: Assessment (02/06/16 1305)  GWN Pain Level (Read Only): 10 (02/06/16 0024)  Pain Scale Used: Numeric Scale (0-10) (02/06/16 1305)    Labs Values:    HBsAg Result:HBsAg (Antigen) Result: Negative (02/06/16 1305)  HBsAg Date Drawn: HBsAg Date Drawn: 02/05/16 (02/06/16 1305)  HBsAg Next Due Date:     Lab Results   Component Value Date    BUN 69 (H) 02/06/2016    NA 136 02/06/2016    K 4.6 02/06/2016    CREAT 12.09 (H) 02/06/2016    CO2 20.0 02/06/2016    CA 7.3 (L) 02/06/2016    PHOS 5.8 (H) 01/06/2016    GLU 74 02/06/2016    ALB 2.6 (L) 01/26/2016    HGB 8.6 (L) 02/06/2016    HCT 25.9 (L) 02/06/2016    WBC 5.6 02/01/2016    PLT 134 02/01/2016    PTT 40 (H) 11/10/2015    PT 17.4 (H) 11/12/2015    INR 1.4 (H) 11/12/2015         Current Diet Order      Diet renal 80 GM Protein (NO PROTEIN RESTRICTION); NO ADDED SALT; 1500 ML FLUID; (LESS THAN 65 meq POTASSIUM AND LESS THAN 800 meq OF PHOSPHOROUS PER DAY)     RO/Hemodialysis Machine Safety Checks - Before Each Treatment:  RO/Hemodialysis Dance movement psychotherapist Number: [id number] (02/06/16 1305)  RO #: [id number] (02/06/16 1305)  Water Hardness: 0 (02/06/16 1305)  pH: 7.6 (02/06/16 1305)  Pressure Test Verified: Yes (02/06/16 1305)  Alarms Verified: Passed (02/06/16 1305)  Machine Temperature: 98.6 F (37 C) (02/06/16 1305)  Alarms Verified: Yes (02/06/16 1305)  Na+ mEq (Machine): 138 mEq (02/06/16 1305)  Bicarb mEq (Machine): 35 mEq (02/06/16 1305)  Hemodialysis Conductivity (Machine): 13.9 (02/06/16 1305)  Hemodialysis Conductivity (Meter): 14 (02/06/16 1305)  Dialyzer Lot Number: [id number] (02/06/16 1305)  Tubing Lot Number: [id number] (02/06/16  1305)  RO Machine Log Completed: Yes (02/06/16 1305)    Chlorine Testing:  RO/Hemodialysis Machine Safety Checks  Is Total Chlorine less than 0.1 ppm?: Yes (02/06/16 1305)  Orignial Total Chlorine Testing Time: 0815 (02/06/16 1305)  At 4 Hour Total Chlorine Testing Time: 1215 (02/06/16 1305)    Vitals and Weight:  Patient Vitals for the past 4 hrs:   BP Temp Pulse Resp   02/06/16 1610 (!) 180/101 - 100 18   02/06/16 1600 (!) 193/105 - 95 -   02/06/16 1530 (!) 178/105 - 95 -   02/06/16 1500 (!) 190/107 - (!) 101 -   02/06/16 1430 (!) 182/100 - 95 -   02/06/16 1400 (!) 192/97 - 95 -   02/06/16 1330 (!) 163/95 - 97 -   02/06/16 1313 (!) 160/94 - 98 -   02/06/16 1305 (!) 158/91 98.3 F (36.8 C) 98 18        Dialysis Weight  Pre-Treatment Weight (Kg): 92.1 (02/06/16 1305)  Scale Type: Floor Scale (02/02/16 1350)  Post-Treatment Weight (Kg): 90.1 (02/06/16 1610)    Treatment:   02/06/16 1313 02/06/16 1330 02/06/16 1400   Vitals   Heart Rate 98 97 95   BP (!) 160/94 (!) 163/95 (!) 192/97   O2 Device Nasal cannula Nasal cannula Nasal cannula   Animal nutritionist Yes --  --    Blood Flow Rate (mL/min) 350 mL/min 350 mL/min 350 mL/min   Arterial Pressure (mmHg) -150 mmHg -150 mmHg -150 mmHg   Venous Pressure (mmHg) 190 190 190   Dialysate Flow Rate (mL/min) 600 mL/min 600 mL/min 600 mL/min   Transmembrane Pressure (mmHg) 90 mmHg 90 mmHg 90 mmHg   Ultrafiltration Rate (mL/Hr) 1000 mL/hr 1000 mL/hr 1000 mL/hr   Fluid Removal (ml) 0 ml 360 ml 920 ml   Fluid Bolus (ml) 200 ml 0 ml 0 ml   Hemodialysis Comments   Arteriovenous Lines Secure Yes Yes Yes   Comments HD treatment started without difficulty. VSS Pt resting comfortably at this time.  Pt resting with eyes closed. Bp stable.       02/06/16 1430 02/06/16 1500 02/06/16 1530   Vitals   Heart Rate 95 (!) 101 95   BP (!) 182/100 (!) 190/107 (!) 178/105   O2 Device Nasal cannula Nasal cannula Nasal cannula   Chief Technology Officer --  --  --    Blood Flow Rate (mL/min) 350 mL/min 350 mL/min 350 mL/min   Arterial Pressure (mmHg) -160 mmHg -160 mmHg -160 mmHg   Venous Pressure (mmHg) 200 200 200   Dialysate Flow Rate (mL/min) 600 mL/min 600 mL/min 600 mL/min   Transmembrane Pressure (mmHg) 90 mmHg 90 mmHg 90 mmHg   Ultrafiltration Rate (mL/Hr) 1000 mL/hr 1000 mL/hr 1120 mL/hr   Fluid Removal (ml) 1312 ml 1819 ml 2371 ml   Fluid Bolus (ml) 0 ml 0 ml 0 ml   Hemodialysis Comments   Arteriovenous Lines Secure Yes Yes Yes   Comments Bicarbonate bottle exchanged. Machine remains in conductivity.  Pt stable. No changes       02/06/16 1537 02/06/16 1600   Vitals   Heart Rate --  95   BP --  (!) 193/105   O2 Device --  Nasal cannula   Animal nutritionist --  --    Blood Flow Rate (mL/min) --  350 mL/min   Arterial Pressure (mmHg) --  -160 mmHg   Venous Pressure (mmHg) --  200   Dialysate Flow Rate (mL/min) --  600 mL/min   Transmembrane Pressure (mmHg) --  90 mmHg   Ultrafiltration Rate (mL/Hr) --  1120 mL/hr   Fluid Removal (ml) --  2490 ml   Fluid Bolus (ml) --  0 ml   Hemodialysis Comments   Arteriovenous Lines Secure Yes Yes   Comments Dr. Vallery Sa at bedside.  HD treatment ended. VSS. Report called to primary nurse.           Intake/Output:  I/O this shift:  In: 100 [P.O.:100]  Out: 2090 [Other:2090]      Medications:  Current Facility-Administered Medications   Medication Dose Route Last Rate Last Dose   . heparin (porcine) injection 1,000-3,000 Units  1,000-3,000 Units Intracatheter            Education:  Education  Person taught: Patient (02/06/16 1610)  Knowledge basis: Substantial (02/06/16 1610)  Topics taught: Procedure;Fluid Management (02/06/16 1610)  Teaching Tools: Explain (02/06/16 1610)  Reponse: Trenton Gammon  Understanding (02/06/16 1610)    Primary Nurse Communication:  Bedside Nurse Communication  Name of bedside RN - pre dialysis: H. Clem, RN (02/06/16 1305)  Name of  bedside RN - post dialysis: H. Clem, RN (02/06/16 1610)      [facility] nurse signature/date/time:__________________________________________

## 2016-02-12 ENCOUNTER — Ambulatory Visit: Payer: Medicare Other | Admitting: Nurse Practitioner

## 2016-02-12 ENCOUNTER — Other Ambulatory Visit: Payer: Self-pay

## 2016-02-12 DIAGNOSIS — G8929 Other chronic pain: Secondary | ICD-10-CM

## 2016-02-12 NOTE — ED Notes (Signed)
Pt requesting Dilaudid for pain, states, "Fentanyl don't work."  MD aware and will speak to patient.

## 2016-02-12 NOTE — ED Notes (Signed)
Assumed care of pt from triage at this time.  Pt arrives with c/o watery, loose stool, vomiting, lower abdominal pain x Monday.  Pt states he missed dialysis treatment Wednesday of this week.  Pt states hx of C.diff, "I've been diagnosed about ten times."  Pt notes he was previously on Flagyl, which he states he finished last week.  Pt denies any CP, SOB.  Pt states no recent fevers, body aches, chills.     Pt resting comfortably on the stretcher in a position of comfort.?? Pt in no acute distress at this time.?? Call bell within reach.?? Side rails x 2.?? Cardiac monitor x 2.  Pt ANO x 4?? Stretcher locked in the lowest position. Pt aware of plan to await for MD/PA-C/NP assessment, and pt/family verbalizes understanding.?? Will continue to monitor abdominal pain, diarrhea.  ????

## 2016-02-12 NOTE — ED Provider Notes (Signed)
[facility]  EMERGENCY DEPARTMENT HISTORY AND PHYSICAL EXAM       Date of Service: 02/12/2016   Patient Name: David [name]   Date of Birth: Jan 30, 1971  Medical Record Number: [id number]    History of Presenting Illness     Chief Complaint   Patient presents with   ??? Abdominal Pain     Onset Monday   ??? Vomiting   ??? Diarrhea        History Provided By:  patient    Additional History:   FILIP LUTEN is a 45 y.o. male with PMhx significant for HTN, ESRD (MWF dialysis), recurrent C. diff who presents ambulatory to the ED with cc of abdominal pain, diarrhea, and nausea/vomiting. Patient states he has been diagnosed and treated for C. Diff up to 10 times, most recently approximately a month ago after he was on abx for presumed peritoneal infection after having a catheter removed. Patient states he has been having severe lower bilateral abdominal pain since Monday, associated with 4-5 liquid stools per day and nausea/vomiting (nonbloody, nonbilious). States the pain is constant and worse with movement. Patient also states he has missed dialysis since Monday, and hasn't taken any of his HTN medications due to the severe nausea and vomiting.     Social Hx: 0.5 ppd Tobacco, denies EtOH, denies Illicit Drugs    There are no other complaints, changes or physical findings at this time.    Primary Care Provider: None   Specialist:     Past History     Past Medical History:   Past Medical History:   Diagnosis Date   ??? Ascites    ??? Asthma    ??? C. difficile colitis    ??? CKD (chronic kidney disease) stage V requiring chronic dialysis ([place])    ??? HTN (hypertension)    ??? SBP (spontaneous bacterial peritonitis) ([place])         Past Surgical History:   History reviewed. No pertinent surgical history.     Family History:   History reviewed. No pertinent family history.     Social History:   Social History   Substance Use Topics   ??? Smoking status: Heavy Tobacco Smoker     Packs/day: 0.50    ??? Smokeless tobacco: Never Used   ??? Alcohol use No        Allergies:   Allergies   Allergen Reactions   ??? Lisinopril Shortness of Breath   ??? Morphine Shortness of Breath   ??? Toradol [Ketorolac] Shortness of Breath         Review of Systems   Review of Systems   Constitutional: Negative for activity change and fever.   HENT: Negative for sore throat and trouble swallowing.    Eyes: Negative for photophobia and pain.   Respiratory: Positive for shortness of breath. Negative for chest tightness.    Cardiovascular: Negative for chest pain and leg swelling.   Gastrointestinal: Positive for abdominal pain, diarrhea, nausea and vomiting.   Endocrine: Negative for polydipsia and polyphagia.   Genitourinary: Negative for dysuria and frequency.   Musculoskeletal: Negative for back pain and neck pain.   Skin: Negative for pallor and rash.   Neurological: Negative for dizziness and headaches.   Psychiatric/Behavioral: Negative for agitation and confusion.       Physical Exam  Physical Exam   Constitutional: He is oriented to person, place, and time. He appears well-developed and well-nourished.   Overweight AA  Male   HENT:   Head: Normocephalic and atraumatic.   Eyes: Conjunctivae are normal. Pupils are equal, round, and reactive to light. Right eye exhibits no discharge. No scleral icterus.   Neck: Normal range of motion. Neck supple. No tracheal deviation present.   Cardiovascular: Regular rhythm and normal heart sounds.    No murmur heard.  Tachycardic   Pulmonary/Chest: Effort normal and breath sounds normal. No stridor. No respiratory distress. He has no wheezes.   Abdominal: Soft. Bowel sounds are normal. He exhibits distension. There is tenderness. There is no rebound and no guarding.   Musculoskeletal: He exhibits no edema, tenderness or deformity.   Neurological: He is alert and oriented to person, place, and time. No cranial nerve deficit. Coordination normal.    Skin: Skin is warm and dry. No rash noted. No erythema.   Psychiatric: He has a normal mood and affect. His behavior is normal.   Nursing note and vitals reviewed.    Medical Decision Making   I am the first provider for this patient.     I reviewed the vital signs, available nursing notes, past medical history, past surgical history, family history and social history.     Mr Retherford is a 45 year old male with ESRD, HTN, multiple ED visits for abdominal pain with negative CT as of 9/27 at [facility], and has left multiple times AMA from the ED and after being admitted to the hospital who presents with abdominal pain and diarrhea. We'll check a c. Diff here if he is able to stool for [place]. His K is not elevated, no emergent need for dialysis. No indication for repeat imaging, as he is able to pass bowel movements.  It appears that the patient presented to this ED with similar complaints on 9.28, the same day he left AMA from [facility] for the same complaints and had CT done there and here both negative for intraabdominal pathology. Will attempt analgesia with fentanyl and give zofran for antiemetic.     DDx  Chronic Abdominal pain  Hyperkalemia  HTN Urgency  C. Diff    Old Medical Records: Old medical records. Reviewed records from [facility]. The patient has had multiple admissions for the same complaint there, and has left AMA multiple times after either receiving opioid pain medications or being refused them. PMP Shows last fill for opioids is 10/12/2015    Provider Notes:       ED Course:  10:03 PM   Initial assessment performed. The patients presenting problems have been discussed, and they are in agreement with the care plan formulated and outlined with them.  I have encouraged them to ask questions as they arise throughout their visit.  10:34 PM Looked at the patient's record from [facility] (I have seen the patient there for similar complaints) I do not see a positive c.diff test there. I  have also performed paracentesis on this patient, and he did not have SBP.     Progress Notes:   10:57 PM Talked to patient, he's still having some discomfort. C.diff sent, will await results. He's resting in bed, no acute distress. Patient is hypertensive but near his baseline, no longer tachycardic.  11:20 PM C.diff collected, will discharge the patient and he can follow up with results if they are positive. Told to go to Dialysis tomorrow. Verbalized understanding and agreement of the plan.    Procedures:     Diagnostic Study Results     Labs -  Recent Results (from the past 12 hour(s))   CBC WITH AUTOMATED DIFF    Collection Time: 02/12/16  9:08 PM   Result Value Ref Range    WBC 9.9 4.1 - 11.1 K/uL    RBC 3.22 (L) 4.10 - 5.70 M/uL    HGB 9.4 (L) 12.1 - 17.0 g/dL    HCT 29.8 (L) 36.6 - 50.3 %    MCV 92.5 80.0 - 99.0 FL    MCH 29.2 26.0 - 34.0 PG    MCHC 31.5 30.0 - 36.5 g/dL    RDW 16.1 (H) 11.5 - 14.5 %    PLATELET 180 150 - 400 K/uL    NEUTROPHILS PENDING %    LYMPHOCYTES PENDING %    MONOCYTES PENDING %    EOSINOPHILS PENDING %    BASOPHILS PENDING %    ABS. NEUTROPHILS PENDING K/UL    ABS. LYMPHOCYTES PENDING K/UL    ABS. MONOCYTES PENDING K/UL    ABS. EOSINOPHILS PENDING K/UL    ABS. BASOPHILS PENDING K/UL    DF PENDING    METABOLIC PANEL, COMPREHENSIVE    Collection Time: 02/12/16  9:08 PM   Result Value Ref Range    Sodium 139 136 - 145 mmol/L    Potassium 4.4 3.5 - 5.1 mmol/L    Chloride 104 97 - 108 mmol/L    CO2 24 21 - 32 mmol/L    Anion gap 11 5 - 15 mmol/L    Glucose 101 (H) 65 - 100 mg/dL    BUN 52 (H) 6 - 20 MG/DL    Creatinine 9.90 (H) 0.70 - 1.30 MG/DL    BUN/Creatinine ratio 5 (L) 12 - 20      GFR est AA 7 (L) >60 ml/min/1.67m    GFR est non-AA 6 (L) >60 ml/min/1.798m   Calcium 7.6 (L) 8.5 - 10.1 MG/DL    Bilirubin, total 0.4 0.2 - 1.0 MG/DL    ALT (SGPT) 14 12 - 78 U/L    AST (SGOT) 22 15 - 37 U/L    Alk. phosphatase 202 (H) 45 - 117 U/L    Protein, total 6.7 6.4 - 8.2 g/dL     Albumin 3.0 (L) 3.5 - 5.0 g/dL    Globulin 3.7 2.0 - 4.0 g/dL    A-G Ratio 0.8 (L) 1.1 - 2.2         Vital Signs-Reviewed the patient's vital signs.   Patient Vitals for the past 12 hrs:   Temp Pulse Resp BP SpO2   02/12/16 2058 98.5 ??F (36.9 ??C) (!) 110 16 (!) 193/100 100 %       Medications Given in the ED:  Medications - No data to display    Pulse Oximetry Analysis - Normal 100% on RA     Cardiac Monitor:   Rate: 105  Rhythm: Normal Sinus Rhythm      Diagnosis   Clinical Impression:   No diagnosis found.     Plan:  1:   Follow-up Information     None      Given return precautions, No new meds, patient verablized understanding of importance of going to dialysis tomorrow.  2:   Current Discharge Medication List        Return to ED if Worse    Disposition Note:  DISCHARGE NOTE:  11:20 PM  The patient's results have been reviewed with family and/or caregiver. They verbally convey their understanding and agreement of the patient's signs, symptoms, diagnosis, treatment,  and prognosis. They additionally agree to follow up as recommended in the discharge instructions or to return to the Emergency Room should the patient's condition change prior to their follow-up appointment. The family and/or caregiver verbally agrees with the care-plan and all of their questions have been answered. The discharge instructions have also been provided to the them along with educational information regarding the patient's diagnosis and a list of reasons why the patient would want to return to the ER prior to their follow-up appointment should their condition change.

## 2016-02-12 NOTE — ED Notes (Signed)
Patient no longer makes urine due to dialysis.

## 2016-02-12 NOTE — Progress Notes (Signed)
[name] was no show for his appointment here at the [facility]. To assist with finding PCP . Called to determine if he was able to re established care with his HD center and obtained PCP  I was unable to reach him by phone .Voice message reminding to see care and  requesting Call back.

## 2016-02-13 ENCOUNTER — Inpatient Hospital Stay
Admit: 2016-02-13 | Discharge: 2016-02-13 | Disposition: A | Discharge status: Home / Self Care | Payer: MEDICARE | Attending: Emergency Medicine

## 2016-02-13 LAB — CBC WITH AUTOMATED DIFF
ABS. BASOPHILS: 0 10*3/uL (ref 0.0–0.1)
ABS. EOSINOPHILS: 0.5 10*3/uL — ABNORMAL HIGH (ref 0.0–0.4)
ABS. LYMPHOCYTES: 0.8 10*3/uL (ref 0.8–3.5)
ABS. MONOCYTES: 0.5 10*3/uL (ref 0.0–1.0)
ABS. NEUTROPHILS: 8.1 10*3/uL — ABNORMAL HIGH (ref 1.8–8.0)
BASOPHILS: 0 % (ref 0–1)
EOSINOPHILS: 5 % (ref 0–7)
HCT: 29.8 % — ABNORMAL LOW (ref 36.6–50.3)
HGB: 9.4 g/dL — ABNORMAL LOW (ref 12.1–17.0)
LYMPHOCYTES: 8 % — ABNORMAL LOW (ref 12–49)
MCH: 29.2 PG (ref 26.0–34.0)
MCHC: 31.5 g/dL (ref 30.0–36.5)
MCV: 92.5 FL (ref 80.0–99.0)
MONOCYTES: 5 % (ref 5–13)
NEUTROPHILS: 82 % — ABNORMAL HIGH (ref 32–75)
PLATELET: 180 10*3/uL (ref 150–400)
RBC: 3.22 M/uL — ABNORMAL LOW (ref 4.10–5.70)
RDW: 16.1 % — ABNORMAL HIGH (ref 11.5–14.5)
WBC: 9.9 10*3/uL (ref 4.1–11.1)

## 2016-02-13 LAB — METABOLIC PANEL, COMPREHENSIVE
A-G Ratio: 0.8 — ABNORMAL LOW (ref 1.1–2.2)
ALT (SGPT): 14 U/L (ref 12–78)
AST (SGOT): 22 U/L (ref 15–37)
Albumin: 3 g/dL — ABNORMAL LOW (ref 3.5–5.0)
Alk. phosphatase: 202 U/L — ABNORMAL HIGH (ref 45–117)
Anion gap: 11 mmol/L (ref 5–15)
BUN/Creatinine ratio: 5 — ABNORMAL LOW (ref 12–20)
BUN: 52 MG/DL — ABNORMAL HIGH (ref 6–20)
Bilirubin, total: 0.4 MG/DL (ref 0.2–1.0)
CO2: 24 mmol/L (ref 21–32)
Calcium: 7.6 MG/DL — ABNORMAL LOW (ref 8.5–10.1)
Chloride: 104 mmol/L (ref 97–108)
Creatinine: 9.9 MG/DL — ABNORMAL HIGH (ref 0.70–1.30)
GFR est AA: 7 mL/min/{1.73_m2} — ABNORMAL LOW (ref >60)
GFR est non-AA: 6 mL/min/{1.73_m2} — ABNORMAL LOW (ref >60)
Globulin: 3.7 g/dL (ref 2.0–4.0)
Glucose: 101 mg/dL — ABNORMAL HIGH (ref 65–100)
Potassium: 4.4 mmol/L (ref 3.5–5.1)
Protein, total: 6.7 g/dL (ref 6.4–8.2)
Sodium: 139 mmol/L (ref 136–145)

## 2016-02-13 LAB — C. DIFFICILE (DNA): C. difficile (DNA): NEGATIVE

## 2016-02-13 MED ORDER — FENTANYL CITRATE (PF) 50 MCG/ML IJ SOLN
50 mcg/mL | INTRAMUSCULAR | Status: AC
Start: 2016-02-13 — End: 2016-02-12
  Administered 2016-02-13: 03:00:00 via INTRAVENOUS

## 2016-02-13 MED ORDER — ONDANSETRON (PF) 4 MG/2 ML INJECTION
4 mg/2 mL | INTRAMUSCULAR | Status: AC
Start: 2016-02-13 — End: 2016-02-12
  Administered 2016-02-13: 02:00:00 via INTRAVENOUS

## 2016-02-13 MED ORDER — OXYCODONE-ACETAMINOPHEN 5 MG-325 MG TAB
5-325 mg | Freq: Once | ORAL | Status: DC
Start: 2016-02-13 — End: 2016-02-13

## 2016-02-13 MED FILL — FENTANYL CITRATE (PF) 50 MCG/ML IJ SOLN: 50 mcg/mL | INTRAMUSCULAR | Qty: 2

## 2016-02-13 MED FILL — ONDANSETRON (PF) 4 MG/2 ML INJECTION: 4 mg/2 mL | INTRAMUSCULAR | Qty: 2

## 2016-02-13 MED FILL — OXYCODONE-ACETAMINOPHEN 5 MG-325 MG TAB: 5-325 mg | ORAL | Qty: 2

## 2016-02-13 NOTE — ED Notes (Signed)
Discharge instructions given to patient by MD Termeer.  Verbalized understanding of instructions.  Patient discharged without difficulty.  Patient discharged in stable condition via wheelchair accompanied by staff.

## 2016-02-23 ENCOUNTER — Emergency Department: Payer: Medicare Other

## 2016-02-23 ENCOUNTER — Emergency Department
Admission: EM | Admit: 2016-02-23 | Discharge: 2016-02-23 | Discharge status: Against Medical Advice | Payer: Medicare Other | Attending: Emergency Medicine | Admitting: Emergency Medicine

## 2016-02-23 DIAGNOSIS — N186 End stage renal disease: Secondary | ICD-10-CM | POA: Insufficient documentation

## 2016-02-23 DIAGNOSIS — J45909 Unspecified asthma, uncomplicated: Secondary | ICD-10-CM | POA: Insufficient documentation

## 2016-02-23 DIAGNOSIS — R2232 Localized swelling, mass and lump, left upper limb: Secondary | ICD-10-CM | POA: Insufficient documentation

## 2016-02-23 DIAGNOSIS — Z992 Dependence on renal dialysis: Secondary | ICD-10-CM | POA: Insufficient documentation

## 2016-02-23 DIAGNOSIS — M7989 Other specified soft tissue disorders: Secondary | ICD-10-CM

## 2016-02-23 DIAGNOSIS — F17210 Nicotine dependence, cigarettes, uncomplicated: Secondary | ICD-10-CM | POA: Insufficient documentation

## 2016-02-23 DIAGNOSIS — R1084 Generalized abdominal pain: Secondary | ICD-10-CM | POA: Insufficient documentation

## 2016-02-23 DIAGNOSIS — I120 Hypertensive chronic kidney disease with stage 5 chronic kidney disease or end stage renal disease: Secondary | ICD-10-CM | POA: Insufficient documentation

## 2016-02-23 DIAGNOSIS — Z79899 Other long term (current) drug therapy: Secondary | ICD-10-CM | POA: Insufficient documentation

## 2016-02-23 LAB — LIPASE: Lipase: 49 U/L (ref 8–78)

## 2016-02-23 LAB — CBC AND DIFFERENTIAL
Absolute NRBC: 0 10*3/uL
Basophils Absolute Automated: 0.02 10*3/uL (ref 0.00–0.20)
Basophils Automated: 0.4 %
Eosinophils Absolute Automated: 0.39 10*3/uL (ref 0.00–0.70)
Eosinophils Automated: 7.2 %
Hematocrit: 22.8 % — ABNORMAL LOW (ref 42.0–52.0)
Hgb: 7.3 g/dL — ABNORMAL LOW (ref 13.0–17.0)
Immature Granulocytes Absolute: 0.02 10*3/uL
Immature Granulocytes: 0.4 %
Lymphocytes Absolute Automated: 0.28 10*3/uL — ABNORMAL LOW (ref 0.50–4.40)
Lymphocytes Automated: 5.2 %
MCH: 29.9 pg (ref 28.0–32.0)
MCHC: 32 g/dL (ref 32.0–36.0)
MCV: 93.4 fL (ref 80.0–100.0)
MPV: 9.8 fL (ref 9.4–12.3)
Monocytes Absolute Automated: 0.41 10*3/uL (ref 0.00–1.20)
Monocytes: 7.6 %
Neutrophils Absolute: 4.28 10*3/uL (ref 1.80–8.10)
Neutrophils: 79.2 %
Nucleated RBC: 0 /100 WBC (ref 0.0–1.0)
Platelets: 136 10*3/uL — ABNORMAL LOW (ref 140–400)
RBC: 2.44 10*6/uL — ABNORMAL LOW (ref 4.70–6.00)
RDW: 16 % — ABNORMAL HIGH (ref 12–15)
WBC: 5.4 10*3/uL (ref 3.50–10.80)

## 2016-02-23 LAB — COMPREHENSIVE METABOLIC PANEL
ALT: 7 U/L (ref 0–55)
AST (SGOT): 13 U/L (ref 5–34)
Albumin/Globulin Ratio: 0.9 (ref 0.9–2.2)
Albumin: 2.6 g/dL — ABNORMAL LOW (ref 3.5–5.0)
Alkaline Phosphatase: 208 U/L — ABNORMAL HIGH (ref 38–106)
Anion Gap: 13 (ref 5.0–15.0)
BUN: 54 mg/dL — ABNORMAL HIGH (ref 9–28)
Bilirubin, Total: 0.4 mg/dL (ref 0.2–1.2)
CO2: 21 mEq/L — ABNORMAL LOW (ref 22–29)
Calcium: 6.9 mg/dL — ABNORMAL LOW (ref 8.5–10.5)
Chloride: 105 mEq/L (ref 100–111)
Creatinine: 10.8 mg/dL — ABNORMAL HIGH (ref 0.7–1.3)
Globulin: 2.9 g/dL (ref 2.0–3.6)
Glucose: 95 mg/dL (ref 70–100)
Potassium: 5.5 mEq/L — ABNORMAL HIGH (ref 3.5–5.1)
Protein, Total: 5.5 g/dL — ABNORMAL LOW (ref 6.0–8.3)
Sodium: 139 mEq/L (ref 136–145)

## 2016-02-23 LAB — PT AND APTT
PT INR: 1.2 — ABNORMAL HIGH (ref 0.9–1.1)
PT: 15 s (ref 12.6–15.0)
PTT: 37 s (ref 23–37)

## 2016-02-23 LAB — GFR: EGFR: 6.3

## 2016-02-23 LAB — ETHANOL: Alcohol: NOT DETECTED mg/dL

## 2016-02-23 MED ORDER — DIPHENHYDRAMINE HCL 50 MG/ML IJ SOLN
25.0000 mg | Freq: Once | INTRAMUSCULAR | Status: DC
Start: 2016-02-23 — End: 2016-02-23
  Filled 2016-02-23: qty 1

## 2016-02-23 MED ORDER — ACETAMINOPHEN 500 MG PO TABS
1000.0000 mg | ORAL_TABLET | Freq: Once | ORAL | Status: DC
Start: 2016-02-23 — End: 2016-02-23

## 2016-02-23 MED ORDER — ONDANSETRON HCL 4 MG/2ML IJ SOLN
4.0000 mg | Freq: Once | INTRAMUSCULAR | Status: AC
Start: 2016-02-23 — End: 2016-02-23
  Administered 2016-02-23: 4 mg via INTRAVENOUS
  Filled 2016-02-23: qty 2

## 2016-02-23 MED ORDER — FAMOTIDINE 10 MG/ML IV SOLN (WRAP)
20.0000 mg | Freq: Once | INTRAVENOUS | Status: AC
Start: 2016-02-23 — End: 2016-02-23
  Administered 2016-02-23: 20 mg via INTRAVENOUS
  Filled 2016-02-23: qty 2

## 2016-02-23 MED ORDER — METOCLOPRAMIDE HCL 5 MG/ML IJ SOLN
5.0000 mg | Freq: Once | INTRAMUSCULAR | Status: DC
Start: 2016-02-23 — End: 2016-02-23
  Filled 2016-02-23: qty 2

## 2016-02-23 NOTE — ED Notes (Signed)
Resumed care. Pt repeatedly requesting opioids. discussed risks of chronic opiate use including constipation, tolerance, addiction, opioid-induced hyperalgesia, endocrinological changes, respiratory depression and death. Discussed and offered alternate options for analgesia. Pt refuses, states he is leaving.   Currently [place] result is pending. Pt has not gotten CT. Recommended to pt that he wait for test results. Pt refuses to wait, refuses further workup/care.  The patient is alert and oriented, clinically sober and appears free from distracting injury. The patient appears to have intact insight, judgment, and reason. In my opinion, this patient has the capacity to make decisions.  I have told the patient that if he leaves, the patient's condition may worsen. He may become critically ill and possibly become disabled and/or die.  I'm unable to convince the patient to stay.The patient is unwilling to remain for additional monitoring. The patient is refusing further care and leaving against medical advice           Sharon Seller, MD  02/23/16 2154

## 2016-02-23 NOTE — ED Notes (Signed)
PT states he wants to leave - Dr Crissie Sickles spoke with pt - pt going to sign out AMA

## 2016-02-23 NOTE — ED Notes (Signed)
Dr Alfonso Patten for pt to get ct with IV contrast even though GFR is low. CT tech notified.

## 2016-02-23 NOTE — ED Provider Notes (Signed)
EMERGENCY DEPARTMENT NOTE    Physician/Midlevel provider first contact with patient: 02/23/16 1953         HISTORY OF PRESENT ILLNESS   Historian:Patient  Translator Used: No    Chief Complaint: Abdominal Pain     45 y.o. male presents to the ED with 1 day of right sided abdominal pain and LUE swelling.  Patient is ESRD on dialysis MWF last dialysis Friday.  No CP/SOB.  Patient c/o NBNB N/V and NB diarrhea. No fever/chills.      1. Location of symptoms: abdomen  2. Onset of symptoms: 1 day PTA  3. What was patient doing when symptoms started (Context): see above  4. Severity: moderate  5. Timing: constant  6. Activities that worsen symptoms: none  7. Activities that improve symptoms: none  8. Quality: ache  9. Radiation of symptoms: no  10. Associated signs and Symptoms: see above  11. Are symptoms worsening? yes  MEDICAL HISTORY     Past Medical History:  Past Medical History:   Diagnosis Date   . Asthma without status asthmaticus    . 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 Clostridium difficile diarrhea    . Drug-seeking behavior    . Hypertension    . Kidney failure        Past Surgical History:  Past Surgical History:   Procedure Laterality Date   . AV FISTULA PLACEMENT     . PERITONEAL CATHETER INSERTION         Social History:  Social History     Social History   . Marital status: Divorced     Spouse name: N/A   . Number of children: N/A   . Years of education: N/A     Occupational History   . Not on file.     Social History Main Topics   . Smoking status: Current Every Day Smoker     Packs/day: 0.25     Types: Cigarettes     Last attempt to quit: 09/18/2015   . Smokeless tobacco: Former Neurosurgeon   . Alcohol use No   . Drug use: No   . Sexual activity: Not on file     Other Topics Concern   . Not on file     Social History Narrative   . No narrative on file       Family History:  Family History   Problem Relation Age of Onset   . Hypertension Mother        Outpatient Medication:  Previous Medications     ACETAMINOPHEN (TYLENOL) 325 MG TABLET    Take 2 tablets (650 mg total) by mouth every 4 (four) hours as needed for Pain.    ALBUTEROL (PROVENTIL) (2.5 MG/3ML) 0.083% NEBULIZER SOLUTION    Take 2.5 mg by nebulization 2 (two) times daily as needed.        ALBUTEROL-IPRATROPIUM (COMBIVENT RESPIMAT) 20-100 MCG/ACT AERO SOLN    Inhale 2 puffs into the lungs daily.        AMLODIPINE (NORVASC) 10 MG TABLET    Take 10 mg by mouth daily.        CALCITRIOL (ROCALTROL) 0.5 MCG CAPSULE    Take 1 capsule (0.5 mcg total) by mouth every mon, wed and fri.    CARVEDILOL (COREG) 25 MG TABLET    Take 25 mg by mouth 2 (two) times daily.    FUROSEMIDE (LASIX) 80 MG TABLET    Take 80 mg by mouth daily.  HYDRALAZINE (APRESOLINE) 100 MG TABLET    Take 100 mg by mouth 3 (three) times daily.    SEVELAMER (RENVELA) 800 MG TABLET    Take 800 mg by mouth 3 (three) times daily with meals.    VANCOMYCIN 50 MG/ML SOLUTION    Take 5 mLs (250 mg total) by mouth every 6 (six) hours.5ml (250mg ) PO Q6h For 7 days then 2.37ml (125mg ) PO q6h for 10 days then stop         REVIEW OF SYSTEMS   Review of Systems   Constitutional: Negative for chills and fever.   Respiratory: Negative for cough and shortness of breath.    Cardiovascular: Negative for chest pain and leg swelling.   Gastrointestinal: Positive for abdominal pain, diarrhea, nausea and vomiting. Negative for blood in stool and constipation.   Musculoskeletal: Negative for back pain and neck pain.   Neurological: Negative for seizures and loss of consciousness.   All other systems reviewed and are negative.      PHYSICAL EXAM     ED Triage Vitals [02/23/16 1922]   Enc Vitals Group      BP (!) 187/101      Heart Rate (!) 104      Resp Rate 20      Temp 98.7 F (37.1 C)      Temp Source Oral      SpO2 99 %      Weight 97.5 kg      Height 1.854 m      Head Circumference       Peak Flow       Pain Score 10      Pain Loc       Pain Edu?       Excl. in GC?    Nursing note and vitals  reviewed.  Constitutional:  Well developed, well nourished.  Awake & alert.    Head:  Atraumatic.  Normocephalic.    ENT:  Mucous membranes are moist and intact.  Patent airway.  Neck:  Supple.  No JVD.   Cardiovascular:  tachycardic rate.  Regular rhythm.   Pulmonary/Chest:  No evidence of respiratory distress.  Clear to auscultation bilaterally.  No wheezing, rales or rhonchi. Left chest dialysis catheter.  Abdominal:  Soft and non-distended.  There is no tenderness.  No rebound, guarding, or rigidity.  No bruit. No masses palpable  Back:  No CVA tenderness.   Extremities:  LUE edema.   No cyanosis.  No clubbing.  2+ DP/PT/radial pulses b/l.   Skin:  Skin is warm and dry.  No diaphoresis.    Neurological:  Alert, awake, and appropriate.  Normal speech.  Moves all extremities.  Normal gate.  Psychiatric:  Good eye contact.  Normal interaction, affect, and behavior    MEDICAL DECISION MAKING     DISCUSSION      ED Course    With patient consent left EJ placed without complication.  CT abd/pelvis r/o diverticulitis.  LUE doppler US r/o DVT.  Patient signed out to Dr. Crissie Sickles pending tests results and recheck.    Vital Signs: Reviewed the patient?s vital signs.   Nursing Notes: Reviewed and utilized available nursing notes.  Medical Records Reviewed: Reviewed available past medical records.  Counseling: The emergency provider has spoken with the patient and discussed today?s findings, in addition to providing specific details for the plan of care.  Questions are answered and there is agreement with the plan.      RADIOLOGY  IMAGING STUDIES      [place] VenoDopp Upper Extremity Left   Final Result      Hyperechoic thrombus within the left basilic vein which is a superficial   vein of the upper extremity.      Fonnie Mu, MD    02/23/2016 9:57 PM               PULSE OXIMETRY    Oxygen Saturation by Pulse Oximetry: 99%  Interventions: none  Interpretation:  normal    EMERGENCY DEPT. MEDICATIONS      ED Medication Orders      Start Ordered     Status Ordering Provider    02/23/16 2027 02/23/16 2026  metoclopramide (REGLAN) injection 5 mg  Once     Route: Intravenous  Ordered Dose: 5 mg     Last MAR action:  Not Given Bethann Berkshire D    02/23/16 2027 02/23/16 2026  diphenhydrAMINE (BENADRYL) injection 25 mg  Once     Route: Intravenous  Ordered Dose: 25 mg     Last MAR action:  Not Given Bethann Berkshire D    02/23/16 2027 02/23/16 2026  famotidine (PEPCID) injection 20 mg  Once     Route: Intravenous  Ordered Dose: 20 mg     Last MAR action:  Given Tynleigh Birt D    02/23/16 2025 02/23/16 2024  ondansetron (ZOFRAN) injection 4 mg  Once     Route: Intravenous  Ordered Dose: 4 mg     Last MAR action:  Given Kohlton Gilpatrick D          LABORATORY RESULTS    Ordered and independently interpreted AVAILABLE laboratory tests. Please see results section in chart for full details.  Results for orders placed or performed during the hospital encounter of 02/23/16   Comprehensive Metabolic Panel (CMP)   Result Value Ref Range    Glucose 95 70 - 100 mg/dL    BUN 54 (H) 9 - 28 mg/dL    Creatinine 57.8 (H) 0.7 - 1.3 mg/dL    Sodium 469 629 - 528 mEq/L    Potassium 5.5 (H) 3.5 - 5.1 mEq/L    Chloride 105 100 - 111 mEq/L    CO2 21 (L) 22 - 29 mEq/L    Calcium 6.9 (L) 8.5 - 10.5 mg/dL    Protein, Total 5.5 (L) 6.0 - 8.3 g/dL    Albumin 2.6 (L) 3.5 - 5.0 g/dL    AST (SGOT) 13 5 - 34 U/L    ALT 7 0 - 55 U/L    Alkaline Phosphatase 208 (H) 38 - 106 U/L    Bilirubin, Total 0.4 0.2 - 1.2 mg/dL    Globulin 2.9 2.0 - 3.6 g/dL    Albumin/Globulin Ratio 0.9 0.9 - 2.2    Anion Gap 13.0 5.0 - 15.0   CBC with differential   Result Value Ref Range    WBC 5.40 3.50 - 10.80 x10 3/uL    Hgb 7.3 (L) 13.0 - 17.0 g/dL    Hematocrit 41.3 (L) 42.0 - 52.0 %    Platelets 136 (L) 140 - 400 x10 3/uL    RBC 2.44 (L) 4.70 - 6.00 x10 6/uL    MCV 93.4 80.0 - 100.0 fL    MCH 29.9 28.0 - 32.0 pg    MCHC 32.0 32.0 - 36.0 g/dL    RDW 16 (H) 12 - 15 %    MPV 9.8 9.4 - 12.3 fL  Neutrophils 79.2 None %    Lymphocytes Automated 5.2 None %    Monocytes 7.6 None %    Eosinophils Automated 7.2 None %    Basophils Automated 0.4 None %    Immature Granulocyte 0.4 None %    Nucleated RBC 0.0 0.0 - 1.0 /100 WBC    Neutrophils Absolute 4.28 1.80 - 8.10 x10 3/uL    Abs Lymph Automated 0.28 (L) 0.50 - 4.40 x10 3/uL    Abs Mono Automated 0.41 0.00 - 1.20 x10 3/uL    Abs Eos Automated 0.39 0.00 - 0.70 x10 3/uL    Absolute Baso Automated 0.02 0.00 - 0.20 x10 3/uL    Absolute Immature Granulocyte 0.02 0 x10 3/uL    Absolute NRBC 0.00 0 x10 3/uL   GFR   Result Value Ref Range    EGFR 6.3    Lipase   Result Value Ref Range    Lipase 49 8 - 78 U/L   PT/APTT   Result Value Ref Range    PT 15.0 12.6 - 15.0 sec    PT INR 1.2 (H) 0.9 - 1.1    PT Anticoag. Given Within 48 hrs. None     PTT 37 23 - 37 sec   Ethanol (Alcohol) Level   Result Value Ref Range    Alcohol None Detected None Detected mg/dL       CRITICAL CARE/PROCEDURES    Procedures    DIAGNOSIS      Diagnosis:  Final diagnoses:   Generalized abdominal pain   Left arm swelling       Disposition:  ED Disposition     ED Disposition Condition Date/Time Comment    [facility]  Mon Feb 23, 2016  9:43 PM Date: 02/23/2016  Patient: [name]  Admitted: 02/23/2016  7:32 PM  Attending Provider: Shela Nevin, MD.   Sharon Seller M.D.    [name] or his authorized caregiver has made the decision for the patient to leave the emergency department a gainst the advice of Dr. Crissie Sickles. He or his authorized caregiver has been informed and understands the inherent risks, including death.  He or his authorized caregiver has decided to accept the responsibility for this decision. [name] and all necessar y parties have been advised that he may return for further evaluation or treatment. His condition at time of discharge was stable.  [name] had current vital signs as follows:  BP (!) 162/96   Pulse (!) 102   Temp 98.7 F (37.1 C) (Oral)   Resp  16   Ht 6'  1" (1.854 m)   Wt 97.5 kg             Prescriptions:  Patient's Medications   New Prescriptions    No medications on file   Previous Medications    ACETAMINOPHEN (TYLENOL) 325 MG TABLET    Take 2 tablets (650 mg total) by mouth every 4 (four) hours as needed for Pain.    ALBUTEROL (PROVENTIL) (2.5 MG/3ML) 0.083% NEBULIZER SOLUTION    Take 2.5 mg by nebulization 2 (two) times daily as needed.        ALBUTEROL-IPRATROPIUM (COMBIVENT RESPIMAT) 20-100 MCG/ACT AERO SOLN    Inhale 2 puffs into the lungs daily.        AMLODIPINE (NORVASC) 10 MG TABLET    Take 10 mg by mouth daily.        CALCITRIOL (ROCALTROL) 0.5 MCG CAPSULE    Take  1 capsule (0.5 mcg total) by mouth every mon, wed and fri.    CARVEDILOL (COREG) 25 MG TABLET    Take 25 mg by mouth 2 (two) times daily.    FUROSEMIDE (LASIX) 80 MG TABLET    Take 80 mg by mouth daily.    HYDRALAZINE (APRESOLINE) 100 MG TABLET    Take 100 mg by mouth 3 (three) times daily.    SEVELAMER (RENVELA) 800 MG TABLET    Take 800 mg by mouth 3 (three) times daily with meals.    VANCOMYCIN 50 MG/ML SOLUTION    Take 5 mLs (250 mg total) by mouth every 6 (six) hours.5ml (250mg ) PO Q6h For 7 days then 2.96ml (125mg ) PO q6h for 10 days then stop   Modified Medications    No medications on file   Discontinued Medications    No medications on file        Shela Nevin, MD  02/26/16 1713

## 2016-02-23 NOTE — Discharge Instructions (Signed)
Please continue to closely monitor your symptoms  There are test results that are pending at the time that you have decided to leave against medical advice. There is still a chance that you might have a dangerous condition which can lead to complications including permanent disability/death.   Return to the ER if worsening pain/swelling/chest pain/difficulty breathing/dizziness/lightheadedness/weakness/fever/chills/nausea/vomiting or any other worsening concerns  Please follow up with your primary care physician and nephrologist. You can also follow up with [facility]

## 2016-02-25 DIAGNOSIS — Z992 Dependence on renal dialysis: Secondary | ICD-10-CM

## 2016-02-25 DIAGNOSIS — R7989 Other specified abnormal findings of blood chemistry: Secondary | ICD-10-CM

## 2016-02-25 DIAGNOSIS — D631 Anemia in chronic kidney disease: Secondary | ICD-10-CM

## 2016-02-25 DIAGNOSIS — N186 End stage renal disease: Secondary | ICD-10-CM

## 2016-02-29 ENCOUNTER — Emergency Department: Admit: 2016-02-29 | Payer: MEDICARE

## 2016-02-29 ENCOUNTER — Inpatient Hospital Stay
Admit: 2016-02-29 | Discharge: 2016-03-10 | Discharge status: Against Medical Advice | Payer: MEDICARE | Attending: Internal Medicine | Admitting: Internal Medicine

## 2016-02-29 DIAGNOSIS — A0471 Enterocolitis due to Clostridium difficile, recurrent: Secondary | ICD-10-CM

## 2016-02-29 LAB — CBC WITH AUTOMATED DIFF
ABS. BASOPHILS: 0 10*3/uL (ref 0.0–0.1)
ABS. EOSINOPHILS: 0.3 10*3/uL (ref 0.0–0.4)
ABS. LYMPHOCYTES: 0.6 10*3/uL — ABNORMAL LOW (ref 0.8–3.5)
ABS. MONOCYTES: 0.4 10*3/uL (ref 0.0–1.0)
ABS. NEUTROPHILS: 4.1 10*3/uL (ref 1.8–8.0)
BASOPHILS: 0 % (ref 0–1)
EOSINOPHILS: 6 % (ref 0–7)
HCT: 25.7 % — ABNORMAL LOW (ref 36.6–50.3)
HGB: 8.3 g/dL — ABNORMAL LOW (ref 12.1–17.0)
LYMPHOCYTES: 10 % — ABNORMAL LOW (ref 12–49)
MCH: 29.1 PG (ref 26.0–34.0)
MCHC: 32.3 g/dL (ref 30.0–36.5)
MCV: 90.2 FL (ref 80.0–99.0)
MONOCYTES: 8 % (ref 5–13)
NEUTROPHILS: 76 % — ABNORMAL HIGH (ref 32–75)
PLATELET: 119 10*3/uL — ABNORMAL LOW (ref 150–400)
RBC: 2.85 M/uL — ABNORMAL LOW (ref 4.10–5.70)
RDW: 18.2 % — ABNORMAL HIGH (ref 11.5–14.5)
WBC: 5.4 10*3/uL (ref 4.1–11.1)

## 2016-02-29 LAB — METABOLIC PANEL, COMPREHENSIVE
A-G Ratio: 0.6 — ABNORMAL LOW (ref 1.1–2.2)
ALT (SGPT): 14 U/L (ref 12–78)
AST (SGOT): 19 U/L (ref 15–37)
Albumin: 2.4 g/dL — ABNORMAL LOW (ref 3.5–5.0)
Alk. phosphatase: 221 U/L — ABNORMAL HIGH (ref 45–117)
Anion gap: 13 mmol/L (ref 5–15)
BUN/Creatinine ratio: 5 — ABNORMAL LOW (ref 12–20)
BUN: 57 MG/DL — ABNORMAL HIGH (ref 6–20)
Bilirubin, total: 0.4 MG/DL (ref 0.2–1.0)
CO2: 23 mmol/L (ref 21–32)
Calcium: 6.6 MG/DL — ABNORMAL LOW (ref 8.5–10.1)
Chloride: 104 mmol/L (ref 97–108)
Creatinine: 11.2 MG/DL — ABNORMAL HIGH (ref 0.70–1.30)
GFR est AA: 6 mL/min/{1.73_m2} — ABNORMAL LOW (ref >60)
GFR est non-AA: 5 mL/min/{1.73_m2} — ABNORMAL LOW (ref >60)
Globulin: 3.7 g/dL (ref 2.0–4.0)
Glucose: 74 mg/dL (ref 65–100)
Potassium: 6 mmol/L — ABNORMAL HIGH (ref 3.5–5.1)
Protein, total: 6.1 g/dL — ABNORMAL LOW (ref 6.4–8.2)
Sodium: 140 mmol/L (ref 136–145)

## 2016-02-29 LAB — CK-MB,QUANT.
CK - MB: 36.7 NG/ML — ABNORMAL HIGH (ref <3.6)
CK-MB Index: 2.6 — ABNORMAL HIGH (ref 0–2.5)

## 2016-02-29 LAB — LIPASE: Lipase: 486 U/L — ABNORMAL HIGH (ref 73–393)

## 2016-02-29 LAB — EKG, 12 LEAD, INITIAL
Atrial Rate: 102 {beats}/min
Calculated P Axis: 59 degrees
Calculated R Axis: -15 degrees
Calculated T Axis: 78 degrees
P-R Interval: 186 ms
Q-T Interval: 364 ms
QRS Duration: 86 ms
QTC Calculation (Bezet): 474 ms
Ventricular Rate: 102 {beats}/min

## 2016-02-29 LAB — GLUCOSE, POC: Glucose (POC): 105 mg/dL — ABNORMAL HIGH (ref 65–100)

## 2016-02-29 LAB — LACTIC ACID: Lactic acid: 0.7 MMOL/L (ref 0.4–2.0)

## 2016-02-29 LAB — TROPONIN I: Troponin-I, Qt.: 0.08 ng/mL — ABNORMAL HIGH (ref <0.05)

## 2016-02-29 LAB — CK W/ REFLX CKMB: CK: 1413 U/L — ABNORMAL HIGH (ref 39–308)

## 2016-02-29 LAB — EKG 12-LEAD
Atrial Rate: 102 {beats}/min
P Axis: 59 degrees
P-R Interval: 186 ms
Q-T Interval: 364 ms
QRS Duration: 86 ms
QTc Calculation (Bazett): 474 ms
R Axis: -15 degrees
T Axis: 78 degrees
Ventricular Rate: 102 {beats}/min

## 2016-02-29 MED ORDER — DEXTROSE 50% IN WATER (D50W) IV SYRG
INTRAVENOUS | Status: AC
Start: 2016-02-29 — End: 2016-02-29
  Administered 2016-02-29: 15:00:00 via INTRAVENOUS

## 2016-02-29 MED ORDER — IOPAMIDOL 76 % IV SOLN
370 mg iodine /mL (76 %) | Freq: Once | INTRAVENOUS | Status: AC
Start: 2016-02-29 — End: 2016-02-29
  Administered 2016-02-29: 14:00:00 via INTRAVENOUS

## 2016-02-29 MED ORDER — SODIUM CHLORIDE 0.9 % IJ SYRG
INTRAMUSCULAR | Status: DC | PRN
Start: 2016-02-29 — End: 2016-02-29

## 2016-02-29 MED ORDER — CARVEDILOL 12.5 MG TAB
12.5 mg | Freq: Two times a day (BID) | ORAL | Status: DC
Start: 2016-02-29 — End: 2016-03-10
  Administered 2016-02-29 – 2016-03-09 (×14): via ORAL

## 2016-02-29 MED ORDER — INSULIN REGULAR HUMAN 100 UNIT/ML INJECTION
100 unit/mL | INTRAMUSCULAR | Status: AC
Start: 2016-02-29 — End: 2016-02-29
  Administered 2016-02-29 (×2): via SUBCUTANEOUS

## 2016-02-29 MED ORDER — HYDROMORPHONE (PF) 1 MG/ML IJ SOLN
1 mg/mL | INTRAMUSCULAR | Status: AC
Start: 2016-02-29 — End: 2016-02-29
  Administered 2016-02-29: 12:00:00 via INTRAVENOUS

## 2016-02-29 MED ORDER — SODIUM CHLORIDE 0.9 % IJ SYRG
Freq: Once | INTRAMUSCULAR | Status: AC
Start: 2016-02-29 — End: 2016-02-29
  Administered 2016-02-29: 12:00:00 via INTRAVENOUS

## 2016-02-29 MED ORDER — CALCIUM GLUCONATE 100 MG/ML (10%) IV SOLN
100 mg/mL (10%) | INTRAVENOUS | Status: DC
Start: 2016-02-29 — End: 2016-02-29
  Administered 2016-02-29: 13:00:00 via INTRAVENOUS

## 2016-02-29 MED ORDER — HYDROMORPHONE (PF) 1 MG/ML IJ SOLN
1 mg/mL | INTRAMUSCULAR | Status: DC | PRN
Start: 2016-02-29 — End: 2016-02-29
  Administered 2016-02-28 – 2016-02-29 (×2): via INTRAVENOUS

## 2016-02-29 MED ORDER — SEVELAMER 800 MG TAB
800 mg | Freq: Three times a day (TID) | ORAL | Status: DC
Start: 2016-02-29 — End: 2016-03-10
  Administered 2016-02-29 – 2016-03-09 (×25): via ORAL

## 2016-02-29 MED ORDER — SODIUM CHLORIDE 0.9 % IV
Freq: Once | INTRAVENOUS | Status: AC
Start: 2016-02-29 — End: 2016-02-29
  Administered 2016-02-29: 14:00:00 via INTRAVENOUS

## 2016-02-29 MED ORDER — ONDANSETRON (PF) 4 MG/2 ML INJECTION
4 mg/2 mL | INTRAMUSCULAR | Status: AC
Start: 2016-02-29 — End: 2016-02-29
  Administered 2016-02-29: 12:00:00 via INTRAVENOUS

## 2016-02-29 MED ORDER — SODIUM CHLORIDE 0.9% BOLUS IV
0.9 % | Freq: Once | INTRAVENOUS | Status: AC
Start: 2016-02-29 — End: 2016-02-29

## 2016-02-29 MED ORDER — HYDROMORPHONE (PF) 1 MG/ML IJ SOLN
1 mg/mL | INTRAMUSCULAR | Status: AC
Start: 2016-02-29 — End: 2016-02-29
  Administered 2016-02-29: 13:00:00 via INTRAVENOUS

## 2016-02-29 MED ORDER — AMLODIPINE 5 MG TAB
5 mg | Freq: Every day | ORAL | Status: DC
Start: 2016-02-29 — End: 2016-03-10
  Administered 2016-03-01 – 2016-03-09 (×6): via ORAL

## 2016-02-29 MED ORDER — DIATRIZOATE MEGLUMINE & SODIUM 66 %-10 % ORAL SOLN
66-10 % | ORAL | Status: AC
Start: 2016-02-29 — End: 2016-02-29
  Administered 2016-02-29: 11:00:00 via ORAL

## 2016-02-29 MED ORDER — CALCIUM GLUCONATE 100 MG/ML (10%) IV SOLN
10010 mg/mL (10%) | INTRAVENOUS | Status: AC
Start: 2016-02-29 — End: 2016-02-29
  Administered 2016-02-29: 14:00:00 via INTRAVENOUS

## 2016-02-29 MED ORDER — SODIUM CHLORIDE 0.9 % IJ SYRG
Freq: Three times a day (TID) | INTRAMUSCULAR | Status: DC
Start: 2016-02-29 — End: 2016-03-10
  Administered 2016-02-29 – 2016-03-10 (×32): via INTRAVENOUS

## 2016-02-29 MED ORDER — SODIUM CHLORIDE 0.9 % IJ SYRG
INTRAMUSCULAR | Status: DC | PRN
Start: 2016-02-29 — End: 2016-03-10
  Administered 2016-02-29 – 2016-03-10 (×17): via INTRAVENOUS

## 2016-02-29 MED ORDER — ONDANSETRON (PF) 4 MG/2 ML INJECTION
4 mg/2 mL | INTRAMUSCULAR | Status: AC
Start: 2016-02-29 — End: 2016-02-29
  Administered 2016-02-29: 15:00:00 via INTRAVENOUS

## 2016-02-29 MED ORDER — ONDANSETRON (PF) 4 MG/2 ML INJECTION
4 mg/2 mL | INTRAMUSCULAR | Status: DC | PRN
Start: 2016-02-29 — End: 2016-03-10
  Administered 2016-02-28 – 2016-03-10 (×13): via INTRAVENOUS

## 2016-02-29 MED ORDER — DEXTROSE 50% IN WATER (D50W) IV SYRG
Freq: Once | INTRAVENOUS | Status: DC | PRN
Start: 2016-02-29 — End: 2016-02-29

## 2016-02-29 MED ORDER — HYDROMORPHONE (PF) 1 MG/ML IJ SOLN
1 mg/mL | INTRAMUSCULAR | Status: AC
Start: 2016-02-29 — End: 2016-02-29
  Administered 2016-02-29: 15:00:00 via INTRAVENOUS

## 2016-02-29 MED ORDER — HYDROMORPHONE (PF) 1 MG/ML IJ SOLN
1 mg/mL | INTRAMUSCULAR | Status: DC | PRN
Start: 2016-02-29 — End: 2016-02-29
  Administered 2016-02-29 – 2016-03-01 (×2): via INTRAVENOUS

## 2016-02-29 MED FILL — RENAGEL 800 MG TABLET: 800 mg | ORAL | Qty: 1

## 2016-02-29 MED FILL — ONDANSETRON (PF) 4 MG/2 ML INJECTION: 4 mg/2 mL | INTRAMUSCULAR | Qty: 2

## 2016-02-29 MED FILL — HYDROMORPHONE (PF) 1 MG/ML IJ SOLN: 1 mg/mL | INTRAMUSCULAR | Qty: 2

## 2016-02-29 MED FILL — SODIUM CHLORIDE 0.9 % IV: INTRAVENOUS | Qty: 1000

## 2016-02-29 MED FILL — HYDROMORPHONE (PF) 1 MG/ML IJ SOLN: 1 mg/mL | INTRAMUSCULAR | Qty: 1

## 2016-02-29 MED FILL — INSULIN REGULAR HUMAN 100 UNIT/ML INJECTION: 100 unit/mL | INTRAMUSCULAR | Qty: 1

## 2016-02-29 MED FILL — MD-GASTROVIEW 66 %-10 % ORAL SOLUTION: 66-10 % | ORAL | Qty: 30

## 2016-02-29 MED FILL — BD POSIFLUSH NORMAL SALINE 0.9 % INJECTION SYRINGE: INTRAMUSCULAR | Qty: 10

## 2016-02-29 MED FILL — SODIUM CHLORIDE 0.9 % IV: INTRAVENOUS | Qty: 500

## 2016-02-29 MED FILL — CALCIUM GLUCONATE 100 MG/ML (10%) IV SOLN: 100 mg/mL (10%) | INTRAVENOUS | Qty: 10

## 2016-02-29 MED FILL — CARVEDILOL 12.5 MG TAB: 12.5 mg | ORAL | Qty: 1

## 2016-02-29 MED FILL — ISOVUE-370  76 % INTRAVENOUS SOLUTION: 370 mg iodine /mL (76 %) | INTRAVENOUS | Qty: 100

## 2016-02-29 MED FILL — DEXTROSE 50% IN WATER (D50W) IV SYRG: INTRAVENOUS | Qty: 100

## 2016-02-29 NOTE — ED Notes (Signed)
Assumed care of pt.  Pt placed in position of comfort.  Call bell within reach.

## 2016-02-29 NOTE — ED Notes (Signed)
Bedside shift change report given to Selena BattenKim, RN (oncoming nurse) by Venida Jarvisyan E., RN (offgoing nurse). Report included the following information SBAR, ED Summary, Procedure Summary, MAR and Recent Results.

## 2016-02-29 NOTE — H&P (Signed)
Hospitalist Admission NoteNAME: David [name]   DOB:  1971-03-18   MRN:  [id number]     Date/Time:  02/29/2016 11:17 AM    Patient PCP: None  ________________________________________________________________________    My assessment of this patient's clinical condition and my plan of care is as follows.    Assessment / Plan:  Diarrhea, hx of C. Diff in October per pt.  -Pt with abdominal pain, nausea, vomiting, and decreased PO intake  -Will send for stool studies including C.diff  -In the interim, place on enteric precautions  -Last abx use was last month and he supposedly received Flagyl and Vanco  -WBC is only 5.4  -Continue to monitor  -Antiemetics PRN  -Pain meds as needed  -Try Clear Liquid diet and see if pt tolerates it  -Will not start abx at this point - BCx pending    ESRD on HD MWF for 7 years, missed 11/10 session  Hyperkalemia  LUE swelling  -K 6.0, no peaked T waves on EKG  -Given hyperkalemia cocktail by ED  -Nephrology involved - plan is for short dialysis today and tomorrow  -Monitor labs, repeat ordered   -AVF LUE is not working as per the pt, which is why he has a L chest catheter  -LUE [place] pending - evaluating for clot    Hypertension, uncontrolled  -Should improve with dialysis, will slowly start home meds  -BP currently 149/86    Recurrent Ascites  -CT AP noted, no evidence of cirrhosis - pt denies EtOH use in the past 10 years   -States that he has been worked up for this in the past and it is not because of cirrhosis  -Will continue to monitor, no current concern for SBP - hopeful that dialysis will help to remove some fluid  -SAAG from previous admission shows 1.1, therefore may be due to portal hypertension - will continue to investigate. AST only 19, ALT 14    Code Status: Full Code  Surrogate Decision Maker: Wife  DVT Prophylaxis: SCD  GI Prophylaxis: not indicated  Baseline: Independent        Subjective:   CHIEF COMPLAINT: Nausea, vomiting, diarrhea     HISTORY OF PRESENT ILLNESS:     David [name] is a 45 y.o.  African American male who presents with complaints listed above. He states that he has been having these symptoms since Thursday and has been unable to tolerate and PO since. Due to this, he was unable to go to his dialysis session on Friday. He states that he was told 7 years ago that his kidneys are not working any longer. He also states that he has had fluid in his abdomen for a long time. In fact, he has a standing PRN order to go to [facility] in [place] every Wednesday for drainage. He states that he went last Wednesday and had 7.5L removed. He denies any EtOH use in the past 10 years.    We were asked to admit for work up and evaluation of the above problems.     Past Medical History:   Diagnosis Date   ??? Ascites    ??? Asthma    ??? C. difficile colitis    ??? CKD (chronic kidney disease) stage V requiring chronic dialysis ([place])    ??? HTN (hypertension)    ??? SBP (spontaneous bacterial peritonitis) ([place])         Past Surgical History:   Procedure Laterality Date   ??? HX HERNIA  REPAIR         Social History   Substance Use Topics   ??? Smoking status: Heavy Tobacco Smoker     Packs/day: 0.50     Years: 10.00   ??? Smokeless tobacco: Never Used   ??? Alcohol use No        History reviewed. No pertinent family history.  Allergies   Allergen Reactions   ??? Lisinopril Shortness of Breath   ??? Morphine Shortness of Breath   ??? Toradol [Ketorolac] Shortness of Breath        Prior to Admission medications    Medication Sig Start Date End Date Taking? Authorizing Provider   ondansetron (ZOFRAN ODT) 8 mg disintegrating tablet Take 1 Tab by mouth every eight (8) hours as needed for Nausea. 01/15/16   Zollie Pee, MD   albuterol (PROVENTIL HFA, VENTOLIN HFA, PROAIR HFA) 90 mcg/actuation inhaler Take 2 Puffs by inhalation every six (6) hours as needed for Wheezing. 12/19/15   Eula Listen, MD   methylPREDNISolone (MEDROL, PAK,) 4 mg tablet Use as directed 12/19/15    Eula Listen, MD   sevelamer (RENAGEL) 400 mg tablet Take 800 mg by mouth three (3) times daily (with meals).    Phys Other, MD   carvedilol (COREG) 12.5 mg tablet Take  by mouth two (2) times daily (with meals).    Phys Other, MD   hydrALAZINE (APRESOLINE) 100 mg tablet Take 100 mg by mouth three (3) times daily.    Phys Other, MD   amLODIPine (NORVASC) 10 mg tablet Take 10 mg by mouth daily.    Phys Other, MD   pantoprazole (PROTONIX) 40 mg tablet Take 40 mg by mouth daily.    Phys Other, MD   furosemide (LASIX) 80 mg tablet Take 80 mg by mouth daily.    Phys Other, MD   albuterol (PROVENTIL HFA, VENTOLIN HFA, PROAIR HFA) 90 mcg/actuation inhaler Take 2 Puffs by inhalation every four (4) hours as needed for Wheezing.    Phys Other, MD       REVIEW OF SYSTEMS:     I am not able to complete the review of systems because:   The patient is intubated and sedated    The patient has altered mental status due to his acute medical problems    The patient has baseline aphasia from prior stroke(s)    The patient has baseline dementia and is not reliable historian    The patient is in acute medical distress and unable to provide information           Total of 12 systems reviewed as follows:       POSITIVE= underlined text  Negative = text not underlined  General:  fever, chills, sweats, generalized weakness, weight loss/gain,      loss of appetite   Eyes:    blurred vision, eye pain, loss of vision, double vision  ENT:    rhinorrhea, pharyngitis   Respiratory:   cough, sputum production, SOB, DOE, wheezing, pleuritic pain   Cardiology:   chest pain, palpitations, orthopnea, PND, edema, syncope   Gastrointestinal:  abdominal pain , N/V, diarrhea, dysphagia, constipation, bleeding   Genitourinary:  frequency, urgency, dysuria, hematuria, incontinence   Muskuloskeletal :  arthralgia, myalgia, back pain  Hematology:  easy bruising, nose or gum bleeding, lymphadenopathy    Dermatological: rash, ulceration, pruritis, color change / jaundice  Endocrine:   hot flashes or polydipsia   Neurological:  headache, dizziness, confusion, focal weakness, paresthesia,  Speech difficulties, memory loss, gait difficulty  Psychological: Feelings of anxiety, depression, agitation    Objective:   VITALS:    Visit Vitals   ??? BP 149/86   ??? Pulse 96   ??? Temp 98.3 ??F (36.8 ??C)   ??? Resp 18   ??? Ht '6\' 1"'  (1.854 m)   ??? Wt 111.5 kg (245 lb 13 oz)   ??? SpO2 100%   ??? BMI 32.43 kg/m2       PHYSICAL EXAM:    General:    Alert, cooperative, no distress, appears stated age.     HEENT: Atraumatic, anicteric sclerae, pink conjunctivae  Neck:  Supple, symmetrical,  thyroid: non tender  Lungs:   Clear to auscultation bilaterally.  No Wheezing or Rhonchi. No rales.  Chest wall:  No tenderness  No Accessory muscle use. L chest catheter  Heart:   Regular  rhythm,  No  murmur   No edema. No pericardial rub  Abdomen:   Soft, non-tender, BS+, ascites+   Extremities: LUE swelling, radial pulse palpable, AVF nonfunctional. No asterixis  Skin:     Not pale.  Not Jaundiced  No rashes   Psych:  Fair insight.  Not depressed.  Not anxious or agitated.  Neurologic: EOMs intact. No facial asymmetry. No aphasia or slurred speech. Symmetrical strength, Sensation grossly intact. Alert and oriented X 4.     _______________________________________________________________________  Care Plan discussed with:    Comments   Patient y    Family      RN y    Scientist, forensic:  y    _______________________________________________________________________  Expected  Disposition:   Home with Family y   HH/PT/OT/RN    SNF/LTC    [facility]    ________________________________________________________________________  TOTAL TIME:  61 Minutes    Critical Care Provided     Minutes non procedure based      Comments    y Reviewed previous records   >50% of visit spent in counseling and coordination of care y Discussion  with patient and/or family and questions answered       ________________________________________________________________________  Signed: Oscar La, MD    Procedures: see electronic medical records for all procedures/Xrays and details which were not copied into this note but were reviewed prior to creation of Plan.    LAB DATA REVIEWED:    Recent Results (from the past 24 hour(s))   EKG, 12 LEAD, INITIAL    Collection Time: 02/29/16  5:08 AM   Result Value Ref Range    Ventricular Rate 102 BPM    Atrial Rate 102 BPM    P-R Interval 186 ms    QRS Duration 86 ms    Q-T Interval 364 ms    QTC Calculation (Bezet) 474 ms    Calculated P Axis 59 degrees    Calculated R Axis -15 degrees    Calculated T Axis 78 degrees    Diagnosis       Sinus tachycardia  Possible Left atrial enlargement  Borderline ECG  When compared with ECG of 19-Dec-2015 03:32,  No significant change was found     CBC WITH AUTOMATED DIFF    Collection Time: 02/29/16  6:21 AM   Result Value Ref Range    WBC 5.4 4.1 - 11.1 K/uL    RBC 2.85 (L) 4.10 - 5.70 M/uL    HGB  8.3 (L) 12.1 - 17.0 g/dL    HCT 25.7 (L) 36.6 - 50.3 %    MCV 90.2 80.0 - 99.0 FL    MCH 29.1 26.0 - 34.0 PG    MCHC 32.3 30.0 - 36.5 g/dL    RDW 18.2 (H) 11.5 - 14.5 %    PLATELET 119 (L) 150 - 400 K/uL    NEUTROPHILS 76 (H) 32 - 75 %    LYMPHOCYTES 10 (L) 12 - 49 %    MONOCYTES 8 5 - 13 %    EOSINOPHILS 6 0 - 7 %    BASOPHILS 0 0 - 1 %    ABS. NEUTROPHILS 4.1 1.8 - 8.0 K/UL    ABS. LYMPHOCYTES 0.6 (L) 0.8 - 3.5 K/UL    ABS. MONOCYTES 0.4 0.0 - 1.0 K/UL    ABS. EOSINOPHILS 0.3 0.0 - 0.4 K/UL    ABS. BASOPHILS 0.0 0.0 - 0.1 K/UL   METABOLIC PANEL, COMPREHENSIVE    Collection Time: 02/29/16  6:21 AM   Result Value Ref Range    Sodium 140 136 - 145 mmol/L    Potassium 6.0 (H) 3.5 - 5.1 mmol/L    Chloride 104 97 - 108 mmol/L    CO2 23 21 - 32 mmol/L    Anion gap 13 5 - 15 mmol/L    Glucose 74 65 - 100 mg/dL    BUN 57 (H) 6 - 20 MG/DL    Creatinine 11.20 (H) 0.70 - 1.30 MG/DL     BUN/Creatinine ratio 5 (L) 12 - 20      GFR est AA 6 (L) >60 ml/min/1.20m    GFR est non-AA 5 (L) >60 ml/min/1.745m   Calcium 6.6 (L) 8.5 - 10.1 MG/DL    Bilirubin, total 0.4 0.2 - 1.0 MG/DL    ALT (SGPT) 14 12 - 78 U/L    AST (SGOT) 19 15 - 37 U/L    Alk. phosphatase 221 (H) 45 - 117 U/L    Protein, total 6.1 (L) 6.4 - 8.2 g/dL    Albumin 2.4 (L) 3.5 - 5.0 g/dL    Globulin 3.7 2.0 - 4.0 g/dL    A-G Ratio 0.6 (L) 1.1 - 2.2     LACTIC ACID    Collection Time: 02/29/16  6:21 AM   Result Value Ref Range    Lactic acid 0.7 0.4 - 2.0 MMOL/L   CK W/ REFLX CKMB    Collection Time: 02/29/16  6:21 AM   Result Value Ref Range    CK 1413 (H) 39 - 308 U/L   TROPONIN I    Collection Time: 02/29/16  6:21 AM   Result Value Ref Range    Troponin-I, Qt. 0.08 (H) <0.05 ng/mL   LIPASE    Collection Time: 02/29/16  6:21 AM   Result Value Ref Range    Lipase 486 (H) 73 - 393 U/L   CK-MB,QUANT.    Collection Time: 02/29/16  6:21 AM   Result Value Ref Range    CK - MB 36.7 (H) <3.6 NG/ML    CK-MB Index 2.6 (H) 0 - 2.5     CULTURE, BLOOD, PAIRED    Collection Time: 02/29/16  6:44 AM   Result Value Ref Range    Special Requests: NO SPECIAL REQUESTS      Culture result: NO GROWTH AFTER 1 HOUR     GLUCOSE, POC    Collection Time: 02/29/16 11:05 AM   Result Value Ref Range  Glucose (POC) 105 (H) 65 - 100 mg/dL    Performed by Jannette Spanner

## 2016-02-29 NOTE — Progress Notes (Addendum)
1702 Refused Dialysis due to abdominal pain and not being able to lie flat. Unable to convince patient to have dialysis today. Informed Zenaida NieceVan Dialysis nurse who states he may not be able to get to the patient until tomorrow and he will inform the Nephrologist.     1710 Paging Dr. Theodoro Gristave, patient continuing to have loose bms and requesting medication for abdominal pain and bowel movements. Provided callback number [phone]. Patient describes the pain as a sharp pain along the bottom of his abdomen.     1715 Received a telephone order for 2mg  of Dilaudid every 4 hours and IR consult for therapeutic Paracentesis.     1722 Patient agreed to have dialysis tomorrow as scheduled.     [id number] Patient requesting a GI lite diet, "I would like real food, something like toast would be fine." Paging Dr. Theodoro Gristave to [phone].     1800 Dr. Theodoro Gristave states the patient needs to have dialysis tonight but he will can have his diet advanced.     1805 Notified the patient that we are accommodating the patient by advancing his diet and his pain medications so that he could have tolerate the dialysis session, therefore he needs to have dialysis to improve his overall health. The patient agreed to have the session tonight. The patient states, "I am afraid I will get sick on the machine." but could not clarify what he meant. I informed him that we will help him with whatever obstacles may occur.     Placed GI diet per telephone order and called dietary to order the patient a dinner tray.    [id number] Transport arrived to take the patient to dialysis and the patient refused. Patient states he will go to dialysis tomorrow. "I just feel ill, I will go to dialysis tomorrow."

## 2016-02-29 NOTE — Consults (Signed)
Consultation Note    NAME: David [name]   DOB:  02-02-1971   MRN:  [id number]     Date/Time:  02/29/2016 8:43 AM    I have been asked to see this patient by Dr. Providence LaniusHowell  for advice/opinion re: esrd/hyperkalemia.     Assessment :    Plan:  esrd  Hyperkalemia  Mild pulmonary edema on cxr  Uncontrolled htn  Perm cath  Failed AV accesses  Anemia of CKD    Came to ER with abdominal pain, n/v/d, fever and sob MWF HD in [name]; missed HD on Friday; 2.5 hour HD today for removal of K and volume; HD again tomorrow to keep on MWF HD schedule    Ask Vascular surgery to eval re arm swelling tomorrow?       Subjective:   CHIEF COMPLAINT:  esrd    HISTORY OF PRESENT ILLNESS:     David [name] is a 45 y.o.   male who has a history of esrd. [name] missed HD on Friday. He is here with abdominal pain and diarrhea. Mild sob. Uses a perm cath for HD. Swelling in left arm.    Past Medical History:   Diagnosis Date   ??? Ascites    ??? Asthma    ??? C. difficile colitis    ??? CKD (chronic kidney disease) stage V requiring chronic dialysis ([facility])    ??? HTN (hypertension)    ??? SBP (spontaneous bacterial peritonitis) ([facility])       Past Surgical History:   Procedure Laterality Date   ??? HX HERNIA REPAIR       Social History   Substance Use Topics   ??? Smoking status: Heavy Tobacco Smoker     Packs/day: 0.50     Years: 10.00   ??? Smokeless tobacco: Never Used   ??? Alcohol use No      History reviewed. No pertinent family history.   Allergies   Allergen Reactions   ??? Lisinopril Shortness of Breath   ??? Morphine Shortness of Breath   ??? Toradol [Ketorolac] Shortness of Breath      Prior to Admission medications    Medication Sig Start Date End Date Taking? Authorizing Provider   ondansetron (ZOFRAN ODT) 8 mg disintegrating tablet Take 1 Tab by mouth every eight (8) hours as needed for Nausea. 01/15/16   Lanell Personsara Marks, MD   albuterol (PROVENTIL HFA, VENTOLIN HFA, PROAIR HFA) 90 mcg/actuation inhaler Take 2 Puffs by inhalation every six (6) hours as needed for  Wheezing. 12/19/15   Loni BeckwithWilliam Azie, MD   methylPREDNISolone (MEDROL, PAK,) 4 mg tablet Use as directed 12/19/15   Loni BeckwithWilliam Azie, MD   sevelamer (RENAGEL) 400 mg tablet Take 800 mg by mouth three (3) times daily (with meals).    Phys Other, MD   carvedilol (COREG) 12.5 mg tablet Take  by mouth two (2) times daily (with meals).    Phys Other, MD   hydrALAZINE (APRESOLINE) 100 mg tablet Take 100 mg by mouth three (3) times daily.    Phys Other, MD   amLODIPine (NORVASC) 10 mg tablet Take 10 mg by mouth daily.    Phys Other, MD   pantoprazole (PROTONIX) 40 mg tablet Take 40 mg by mouth daily.    Phys Other, MD   furosemide (LASIX) 80 mg tablet Take 80 mg by mouth daily.    Phys Other, MD   albuterol (PROVENTIL HFA, VENTOLIN HFA, PROAIR HFA) 90 mcg/actuation inhaler Take 2 Puffs by inhalation every  four (4) hours as needed for Wheezing.    Phys Other, MD     REVIEW OF SYSTEMS:     []   Unable to obtain reliable ROS due to  []  mental status  []  sedated   []  intubated   [x]  Total of 12 systems reviewed as follows:  Constitutional: negative fever, negative chills, negative weight loss  Eyes:   negative visual changes  ENT:   negative sore throat, tongue or lip swelling  Respiratory:  negative cough, negative dyspnea  Cards:  negative for chest pain, palpitations, lower extremity edema  GI:   +for  diarrhea, and abdominal pain  GU:  negative for frequency, dysuria  Integument:  negative for rash and pruritus  Heme:  negative for easy bruising and gum/nose bleeding  Musculoskel: negative for myalgias,  back pain and muscle weakness  Neuro:  negative for headaches, dizziness, vertigo  Psych:  negative for feelings of anxiety, depression   Travel?: none    Objective:   VITALS:    Visit Vitals   ??? BP 149/86   ??? Pulse 96   ??? Temp 98.3 ??F (36.8 ??C)   ??? Resp 18   ??? Ht 6\' 1"  (1.854 m)   ??? Wt 111.5 kg (245 lb 13 oz)   ??? SpO2 100%   ??? BMI 32.43 kg/m2     PHYSICAL EXAM:  Gen:  [x]   WD [x]   WN  []  cachectic []   thin []   obese []   disheveled              []   ill apearing  []    Critical  []    Chronic    [x]   No acute distress    HEENT:   [x]  NC/AT/PERRLA/EOMI    []  pink conjunctivae      []  pale conjunctivae                  PERRL  []  yes  []  no      []  moist mucosa    []  dry mucosa    hearing intact to voice [x]  yes  []  No                 NECK:   supple [x]  yes  []  no        masses []  yes  []  No               thyroid  []   non tender  []   tender    RESP:   [x]  CTA bilaterally/no wheezing/rhonchi/rales/crackles    []  rhonchi bilaterally - no dullness  []  wheezing   []  rhonchi   []  crackles     use of accessory muscles []  yes [x]  no    CARD:   [x]   regular rate and rhythm/No murmurs/rubs/gallops    murmur  []  yes ()  []  no      Rubs  []  yes  []  no       Gallops []  yes  []  no    Rate []   regular  []   irregular        carotid bruits  []  Right  []   Left                 LE edema []  yes  [x]  no           JVP  []   yes   []   no    ABD:    [x]  soft/non distended/non tender/+bowel sounds/no HSM    []   Rigid    tenderness []  yes []  no   Liver enlargement  []   yes []   no                Spleen enlargement  []   yes []   no     distended []   yes []  no     bowel sound  []  hypoactive   []  hyperactive    LYMPH:    Neck []   yes [x]   no       Axillae []   yes []   no    SKIN:   Rashes []   yes   [x]   no    Ulcers []   yes   []   no               []  tight to palpitation    skin turgor []   good  []  poor  []  decreased               Cyanosis/clubbing []   yes []   no    NEUR:   [x]  cranial nerves II-XII grossly intact       []  Cranial nerves deficit                 []   facial droop    []   slurred speech   []  aphasic     []  Strength normal     []   weakness  []   LUE  []    RUE/ []   LLE  []    RLE    follows commands  [x]   yes []   no           PSYCH:   insight []  poor []  good   Alert and Oriented to  [x]  person  [x]  place  [x]   time                    []  depressed []  anxious []  agitated  []  lethargic []  stuporous  []  sedated     LAB DATA REVIEWED:    Recent Labs      02/29/16   0621    WBC  5.4   HGB  8.3*   HCT  25.7*   PLT  119*     Recent Labs      02/29/16   0621   NA  140   K  6.0*   CL  104   CO2  23   BUN  57*   CREA  11.20*   GLU  74   CA  6.6*     Recent Labs      02/29/16   0621   SGOT  19   ALT  14   AP  221*   TBILI  0.4   ALB  2.4*   GLOB  3.7   LPSE  486*     No results for input(s): INR, PTP, APTT in the last 72 hours.    No lab exists for component: INREXT   No results for input(s): FE, TIBC, PSAT, FERR in the last 72 hours.   No results for input(s): PH, PCO2, PO2 in the last 72 hours.  No results for input(s): CPK, CKMB in the last 72 hours.    No lab exists for component: TROPONINI  No results found for: GLUCPOC    Procedures: see electronic medical records for all procedures/Xrays and details which were not copied into this note but were reviewed prior to creation of Plan.    ________________________________________________________________________  ___________________________________________________  Consulting Physician: Sherrlyn Hock, MD

## 2016-02-29 NOTE — Progress Notes (Signed)
Lest arm swelling is worsening per patient.   Arm and hand warm to touch, + radial pulses.   PVL ordered earlier but no result documented.     Re ordered PVL to r/o DVT.

## 2016-02-29 NOTE — Progress Notes (Addendum)
Came to hospital to give patient his ordered dialysis treatment. His nurse called me and informed me that patient was refusing dialysis. I told her to tell him that we probably wouldn't be able to do the treatment later tonight because our schedule is full. She informed him of this and he still refused. Dr Deeann SaintMcNeer notified.

## 2016-02-29 NOTE — ED Notes (Signed)
TRANSFER - OUT REPORT:    Verbal report given to julia RN(name) on David [name]  being transferred to 3239(unit) for routine progression of care       Report consisted of patient???s Situation, Background, Assessment and   Recommendations(SBAR).     Information from the following report(s) SBAR, ED Summary, Intake/Output, MAR and Recent Results was reviewed with the receiving nurse.    Lines:   Peripheral IV 02/29/16 Right Other(comment) (Active)        Opportunity for questions and clarification was provided.      Patient transported with:   Monitor  Registered Nurse

## 2016-02-29 NOTE — Other (Signed)
Pt. Complaining of increased swelling in left hand and pain pt. Left arm swollen able to grasp hand +2 strength slight weakness noted paged on-call hospitilist Dr. Gust RungHorve states that he is aware and someone will follow up and manage his care at some point

## 2016-02-29 NOTE — ED Notes (Addendum)
Dr. Providence LaniusHowell at bedside attempting an access at this time; unsuccessful. Charge nurse notified.

## 2016-02-29 NOTE — Progress Notes (Signed)
Primary Nurse Alphonse GuildJulia E Blackwell, RN and TurkeyVictoria, RN performed a dual skin assessment on this patient No impairment noted  Braden score is 20

## 2016-02-29 NOTE — ED Notes (Addendum)
Discussed patient situation/condition with Dr. Providence LaniusHowell as patient is a dialysis, did not get dialysis on Friday and BPs 183/101. Per Dr. Providence LaniusHowell will hold off on giving fluids at this time.    Discussed with Dr. Providence LaniusHowell that we would not be able to obtain urine sample as patient does not make urine, will discontinue order.

## 2016-02-29 NOTE — ED Provider Notes (Signed)
[facility]  EMERGENCY DEPARTMENT HISTORY AND PHYSICAL EXAM       Date of Service: 02/29/2016   Patient Name: David [name]   Date of Birth: 10-Nov-1970  Medical Record Number: [id number]    History of Presenting Illness     Chief Complaint   Patient presents with   ??? Shortness of Breath     patient complain of shortness of breath and is diaylzed M/W/F   ??? Vomiting     patient also complain of vomiting        History Provided By:  patient    Additional History:   David [name] is a 45 y.o. male with PMhx significant for CKD, C. Diff, HTN, asthma, and ascites, who presents ambulatory to the ED with cc of diffuse abdominal pain x 3 days. He notes associated symptoms of nausea, 4-5 episodes of emesis, 5-6 episodes of diarrhea, and a fever of 101.45F orally (at 11PM). Pt also reports SOB today. He states he is a dialysis pt (MWF) but had not been to his appointment x 2 days ago (Friday) secondary to feeling unwell. Pt has kidney failure secondary to HTN and is currently anuric. Pt has access on his L arm, but notes a swelling x 2 days. He has a catheter on his L chest wall and states there was no difficulty in using the catheter on Wednesday (11/8), but reports "it clotted in my session." He notes the abdominal pain and diarrhea had begun first. He states he had C. Diff last month and was Rx'd vancomycin and flagyl. Pt attempted to take an OTC Tylenol to relieve his fever, but states he had vomited the medication up. He denies any h/o a filter placement. Pt denies a h/o liver diseases. He denies the use of any anticoagulants. Pt denies any pain in his groin or upon deep inspiration.    Social Hx: + Tobacco, - EtOH, - Illicit Drugs    There are no other complaints, changes or physical findings at this time.    Primary Care Provider: None     Past History     Past Medical History:   Past Medical History:   Diagnosis Date   ??? Ascites    ??? Asthma    ??? C. difficile colitis     ??? CKD (chronic kidney disease) stage V requiring chronic dialysis ([place])    ??? HTN (hypertension)    ??? SBP (spontaneous bacterial peritonitis) ([place])         Past Surgical History:   Past Surgical History:   Procedure Laterality Date   ??? HX HERNIA REPAIR          Family History:   History reviewed. No pertinent family history.     Social History:   Social History   Substance Use Topics   ??? Smoking status: Heavy Tobacco Smoker     Packs/day: 0.50     Years: 10.00   ??? Smokeless tobacco: Never Used   ??? Alcohol use No        Allergies:   Allergies   Allergen Reactions   ??? Lisinopril Shortness of Breath   ??? Morphine Shortness of Breath   ??? Toradol [Ketorolac] Shortness of Breath         Review of Systems   Review of Systems   Constitutional: Positive for fever. Negative for chills.   HENT: Negative for congestion.    Eyes: Negative for visual disturbance.   Respiratory: Positive  for shortness of breath. Negative for chest tightness.    Cardiovascular: Positive for leg swelling (chronic). Negative for chest pain.        LUE edema   Gastrointestinal: Positive for abdominal pain (+diffuse), diarrhea, nausea and vomiting.   Endocrine: Negative for polyuria.   Genitourinary: Negative for dysuria, frequency, penile pain and testicular pain.   Musculoskeletal: Negative for myalgias.        +LUE swelling   Skin: Negative for color change.   Allergic/Immunologic: Negative for immunocompromised state.   Neurological: Negative for numbness.       Physical Exam  Physical Exam  Nursing note and vitals reviewed.  General appearance: mild distress & uncomfortable  Eyes: PERRL, EOMI, conjunctiva normal, anicteric sclera  HEENT: mucous membranes slightly tacky, oropharynx is clear  Pulmonary:  slightly diminished breath sounds at bases  Cardiac: tachycardia and regular rhythm, no murmurs, gallops, or rubs, 1+DP pulses, 1+ radial pulses, palpable thrill L forearm  Abdomen: soft, diffusely TTP, slightly distended, bowel sounds present,  diffuse voluntary guarding present, no rebound  MSK: 1+ pre-tibial edema BL, old fistula RUE, significant edema LUE  Neuro: Alert, answers questions appropriately  Skin: capillary refill brisk, HD cath L chest wall, no signs of erythema or cellulitis, chronic stasis changes BLE    Medical Decision Making   I am the first provider for this patient.     I reviewed the vital signs, available nursing notes, past medical history, past surgical history, family history and social history.     Old Medical Records: Old medical records.  Nursing notes.     Provider Notes:   DDx: Recurrent C. Diff infection, AGE, diverticulitis, metabolic derangement, volume overload/thir spacing, pleural effusion, UE DVT, clotted dialysis catheter      Critical Care Time:   40 min    ED Course:  5:31 AM   Initial assessment performed. The patients presenting problems have been discussed, and they are in agreement with the care plan formulated and outlined with them.  I have encouraged them to ask questions as they arise throughout their visit.    Progress Notes:     CONSULT NOTE:  8:11 AM  Ellin Mayhew. Lavone Neri, MD spoke with Dr. Hulda Humphrey,  Specialty: Nephrology  Discussed patient's hx, disposition, and available diagnostic and imaging results.  Reviewed care plans.  Consultant agrees with plans as outlined. Consultant reports we should contact Nephrology Specialist, who is the pt's nephrology group.    CONSULT NOTE:  8:22 AM  Ellin Mayhew. Lavone Neri, MD spoke with Dr. Smitty Cords,  Specialty: Nephrology  Discussed patient's hx, disposition, and available diagnostic and imaging results.  Reviewed care plans.  Consultant agrees with plans as outlined. Consultant will attempt dialysis today with existing HD catheter.      Procedures:   Procedure Note- Peripheral IV Access  5:41 AM  Performed by: Ellin Mayhew. Lavone Neri, MD  Did not gain IV access using  20 gauge needle because the patient had no  vascular access.  After cleaning the site with alcohol prep, the Right EJ vein was localized without ultrasound guidance in an anterior approach.  Line confirmation was obtained by direct visualization and good blood return. No anaesthetic was used.    Estimated blood loss: <1 mL  The procedure took 1-15 minutes, and pt tolerated well.  Written by Betsey Amen, ED Scribe, as dictated by Ellin Mayhew. Lavone Neri, MD.        SIGN OUT:  8:40 AM  Patient's presentation, labs/imaging and  plan of care was reviewed with Doroteo Bradford, MD as part of sign out.  They will wait for CT abdomen/pelvic, wait until the pt is dialyzed, and dispo as part of the plan discussed with the patient.    Doroteo Bradford, MD's assistance in completion of this plan is greatly appreciated but it should be noted that I will be the provider of record for this patient.    Ellin Mayhew. Lavone Neri, MD    9:05 AM  Bedside handoff from Zonia Kief, MD. Ultrasound tech in room performing left upper extremity ultrasound to evaluate for DVT. Jugular appears patent. Pt requesting pain and nausea medication, stating his pain is 10/10 at this time. Plan for observation vs admission.   Written by Baldwin Crown, ED Scribe, as dictated by Doroteo Bradford, MD.        CRITICAL CARE NOTE :    9:10 AM      IMPENDING DETERIORATION -Renal    ASSOCIATED RISK FACTORS - Metabolic changes/hyperkalemia    MANAGEMENT- Bedside Assessment and Supervision of Care    INTERPRETATION -  Xrays, CT Scan, ECG and Blood Pressure    INTERVENTIONS - Metobolic interventions to temporarily lower serum K    CASE REVIEW - Hospitalist, Medical Sub-Specialist, Nursing and Family    TREATMENT RESPONSE -Stable    PERFORMED BY - Self        NOTES   :      I have spent 40 minutes of critical care time involved in lab review, consultations with specialist, family decision- making, bedside attention and documentation. During this entire length of time I was immediately available to the patient .     Ellin Mayhew. Lavone Neri, MD      CONSULT NOTE:   11:16 AM  Doroteo Bradford, MD Dr. Lendon Collar  Specialty: Hospitalist  Discussed pt's hx, disposition, and available diagnostic and imaging results. Reviewed care plans. Consultant will evaluate pt for admission.      Diagnostic Study Results   Labs -  Recent Results (from the past 12 hour(s))   EKG, 12 LEAD, INITIAL    Collection Time: 02/29/16  5:08 AM   Result Value Ref Range    Ventricular Rate 102 BPM    Atrial Rate 102 BPM    P-R Interval 186 ms    QRS Duration 86 ms    Q-T Interval 364 ms    QTC Calculation (Bezet) 474 ms    Calculated P Axis 59 degrees    Calculated R Axis -15 degrees    Calculated T Axis 78 degrees    Diagnosis       Sinus tachycardia  Possible Left atrial enlargement  When compared with ECG of 19-Dec-2015 03:32,  No significant change was found  Confirmed by Rohatgi, Sameer [phone]) on 02/29/2016 12:10:20 PM     CBC WITH AUTOMATED DIFF    Collection Time: 02/29/16  6:21 AM   Result Value Ref Range    WBC 5.4 4.1 - 11.1 K/uL    RBC 2.85 (L) 4.10 - 5.70 M/uL    HGB 8.3 (L) 12.1 - 17.0 g/dL    HCT 25.7 (L) 36.6 - 50.3 %    MCV 90.2 80.0 - 99.0 FL    MCH 29.1 26.0 - 34.0 PG    MCHC 32.3 30.0 - 36.5 g/dL    RDW 18.2 (H) 11.5 - 14.5 %    PLATELET 119 (L) 150 - 400 K/uL    NEUTROPHILS 76 (  H) 32 - 75 %    LYMPHOCYTES 10 (L) 12 - 49 %    MONOCYTES 8 5 - 13 %    EOSINOPHILS 6 0 - 7 %    BASOPHILS 0 0 - 1 %    ABS. NEUTROPHILS 4.1 1.8 - 8.0 K/UL    ABS. LYMPHOCYTES 0.6 (L) 0.8 - 3.5 K/UL    ABS. MONOCYTES 0.4 0.0 - 1.0 K/UL    ABS. EOSINOPHILS 0.3 0.0 - 0.4 K/UL    ABS. BASOPHILS 0.0 0.0 - 0.1 K/UL   METABOLIC PANEL, COMPREHENSIVE    Collection Time: 02/29/16  6:21 AM   Result Value Ref Range    Sodium 140 136 - 145 mmol/L    Potassium 6.0 (H) 3.5 - 5.1 mmol/L    Chloride 104 97 - 108 mmol/L    CO2 23 21 - 32 mmol/L    Anion gap 13 5 - 15 mmol/L    Glucose 74 65 - 100 mg/dL    BUN 57 (H) 6 - 20 MG/DL    Creatinine 11.20 (H) 0.70 - 1.30 MG/DL     BUN/Creatinine ratio 5 (L) 12 - 20      GFR est AA 6 (L) >60 ml/min/1.69m    GFR est non-AA 5 (L) >60 ml/min/1.737m   Calcium 6.6 (L) 8.5 - 10.1 MG/DL    Bilirubin, total 0.4 0.2 - 1.0 MG/DL    ALT (SGPT) 14 12 - 78 U/L    AST (SGOT) 19 15 - 37 U/L    Alk. phosphatase 221 (H) 45 - 117 U/L    Protein, total 6.1 (L) 6.4 - 8.2 g/dL    Albumin 2.4 (L) 3.5 - 5.0 g/dL    Globulin 3.7 2.0 - 4.0 g/dL    A-G Ratio 0.6 (L) 1.1 - 2.2     LACTIC ACID    Collection Time: 02/29/16  6:21 AM   Result Value Ref Range    Lactic acid 0.7 0.4 - 2.0 MMOL/L   CK W/ REFLX CKMB    Collection Time: 02/29/16  6:21 AM   Result Value Ref Range    CK 1413 (H) 39 - 308 U/L   TROPONIN I    Collection Time: 02/29/16  6:21 AM   Result Value Ref Range    Troponin-I, Qt. 0.08 (H) <0.05 ng/mL   LIPASE    Collection Time: 02/29/16  6:21 AM   Result Value Ref Range    Lipase 486 (H) 73 - 393 U/L   CK-MB,QUANT.    Collection Time: 02/29/16  6:21 AM   Result Value Ref Range    CK - MB 36.7 (H) <3.6 NG/ML    CK-MB Index 2.6 (H) 0 - 2.5     CULTURE, BLOOD, PAIRED    Collection Time: 02/29/16  6:44 AM   Result Value Ref Range    Special Requests: NO SPECIAL REQUESTS      Culture result: NO GROWTH AFTER 1 HOUR     GLUCOSE, POC    Collection Time: 02/29/16 11:05 AM   Result Value Ref Range    Glucose (POC) 105 (H) 65 - 100 mg/dL    Performed by GoJannette Spanner      Radiologic Studies -  The following have been ordered and reviewed:  CT Results  (Last 48 hours)               02/29/16 0836  CT ABD PELV W CONT Final result  Impression:  IMPRESSION:   1. No acute intra-abdominal findings with moderate ascites and small pleural   effusions, and diffuse soft tissue edema.   2. Additional incidental findings as above including mild cardiomegaly and renal   atrophy with renal osteodystrophy.       Narrative:  EXAM:  CT ABD PELV W CONT       INDICATION: diffuse abd pain, vomiting, diarrhea, recent C diff, ESRD       COMPARISON: CT 01/07/2016.        CONTRAST:  100 mL of Isovue-370.       TECHNIQUE:    Following the uneventful intravenous administration of contrast, thin axial   images were obtained through the abdomen and pelvis. Coronal and sagittal   reconstructions were generated. Oral contrast administered. CT dose reduction   was achieved through use of a standardized protocol tailored for this   examination and automatic exposure control for dose modulation.       FINDINGS:    LUNG BASES: Linear atelectasis. Small pleural effusions. Otherwise clear.   INCIDENTALLY IMAGED HEART AND MEDIASTINUM: Mild enlargement of the heart. No   pericardial effusion.   LIVER: No mass or biliary dilatation.   GALLBLADDER: Collapsed and not well evaluated.   SPLEEN: No mass.   PANCREAS: No mass or ductal dilatation.   ADRENALS: Unremarkable.   KIDNEYS: Bilateral severe atrophy with no hydronephrosis stone, or mass.   STOMACH: Unremarkable.   SMALL BOWEL: No dilatation or wall thickening.   COLON: No dilatation or wall thickening.   APPENDIX: Unremarkable.   PERITONEUM: Moderate ascites redemonstrated. No pneumoperitoneum.   RETROPERITONEUM: Atherosclerotic calcification of the aorta with no aneurysm or   dissection. No enlarged lymphadenopathy.   REPRODUCTIVE ORGANS: Seminal vesicles and prostate are grossly unremarkable.   URINARY BLADDER: Collapsed with no evident mass or calculus.   BONES: Degenerative spine change and diffusely sclerotic appearance, slightly   more prominent at the vertebral endplates. No aggressive lesion or acute   fracture.   ADDITIONAL COMMENTS: Diffuse soft tissue edema. Anterior abdominal wall hernia   mesh repair anchors.               CXR Results  (Last 48 hours)               02/29/16 0748  XR CHEST PA LAT Final result    Impression:  IMPRESSION: Mild pulmonary vascular congestion with trace pleural effusions and   bibasilar linear atelectasis likely reflect congestive failure. COPD.               Narrative:  EXAM:  XR CHEST PA LAT        INDICATION:   esrd missed HD       COMPARISON: Chest x-ray 927 2 and chest x-ray 11/21/2015 as well as CT 12/19/2015.       FINDINGS: PA and lateral radiographs of the chest demonstrate hyperinflation of   the lungs which otherwise appear clear apart from linear atelectasis at the   bases and trace pleural effusions. The cardiac and mediastinal contours are   normal with mild pulmonary vascular prominence. A stent graft projects over the   right medial clavicle in stable position. A left-sided hemodialysis catheter is   in stable and expected position with the tips extending to the atriocaval   junction.  The bones and soft tissues are within normal limits.                  LUE VENOUS  DUPLEX (preliminary):  Left arm venous duplex was performed.  ??  Left jugular, brachial, radial and ulnar veins with normal Doppler characteristics.  ??  Patient couldn't tolerate pressure for compression of the left jugular vein, it does demonstrate flow.  Brachial, radial and ulnar veins do compress.  ??  Basilic and cephalic veins are being used for avf graft.  ??  Hd cath is visualized.  ??  Final report to follow.      Vital Signs-Reviewed the patient's vital signs.   Patient Vitals for the past 12 hrs:   Temp Pulse Resp BP SpO2   02/29/16 1410 97.8 ??F (36.6 ??C) 97 14 181/90 100 %   02/29/16 1330 - 99 14 165/85 99 %   02/29/16 1315 - 90 17 (!) 172/95 91 %   02/29/16 1300 - 97 10 167/88 100 %   02/29/16 1230 - 100 14 164/90 95 %   02/29/16 1215 - 94 14 (!) 146/94 -   02/29/16 1145 - (!) 102 16 (!) 174/96 98 %   02/29/16 1130 - 94 14 145/89 99 %   02/29/16 1124 - 94 12 - 98 %   02/29/16 1123 - - - 142/86 -   02/29/16 0730 - 96 18 149/86 100 %   02/29/16 0715 - - - (!) 166/100 100 %   02/29/16 0700 - - 19 (!) 171/98 100 %   02/29/16 0655 - - 13 (!) 163/93 99 %   02/29/16 0650 - - 17 (!) 123/111 98 %   02/29/16 0645 - - 16 (!) 156/96 99 %   02/29/16 0640 - - 17 (!) 170/102 100 %   02/29/16 0636 - - 19 (!) 177/91 98 %    02/29/16 0508 - - - (!) 183/101 97 %   02/29/16 0502 - - - (!) 176/91 96 %   02/29/16 0455 98.3 ??F (36.8 ??C) (!) 117 21 (!) 204/105 97 %       Medications Given in the ED:  Medications   sodium chloride 0.9 % bolus infusion 500 mL (0 mL IntraVENous Held 02/29/16 0537)   sodium chloride (NS) flush 5-10 mL (10 mL IntraVENous Given 02/29/16 0837)   amLODIPine (NORVASC) tablet 10 mg (not administered)   carvedilol (COREG) tablet 12.5 mg (not administered)   sevelamer (RENAGEL) tablet 800 mg (800 mg Oral Given 02/29/16 1331)   sodium chloride (NS) flush 5-10 mL (10 mL IntraVENous Given 02/29/16 1429)   ondansetron (ZOFRAN) injection 4 mg (4 mg IntraVENous Given 02/28/16 1332)   HYDROmorphone (PF) (DILAUDID) injection 1 mg (1 mg IntraVENous Given 02/29/16 1429)   diatrizoate meglumine-d.sodium (MD-GASTROVIEW,GASTROGRAFIN) 66-10 % contrast solution 30 mL (30 mL Oral Given 02/29/16 0616)   HYDROmorphone (PF) (DILAUDID) injection 0.5 mg (0.5 mg IntraVENous Given 02/29/16 0639)   ondansetron (ZOFRAN) injection 4 mg (4 mg IntraVENous Given 02/29/16 0637)   0.9% sodium chloride infusion (0 mL/hr IntraVENous IV Completed 02/29/16 0857)   iopamidol (ISOVUE-370) 76 % injection 100 mL (100 mL IntraVENous Given 02/29/16 0837)   sodium chloride (NS) flush 10 mL (10 mL IntraVENous Given 02/29/16 0641)   HYDROmorphone (PF) (DILAUDID) injection 0.5 mg (0.5 mg IntraVENous Given 02/29/16 0735)   calcium gluconate 1 g in 0.9% sodium chloride 100 mL IVPB (0 g IntraVENous IV Completed 02/29/16 0952)   insulin regular (NOVOLIN R, HUMULIN R) injection 10 Units (10 Units SubCUTAneous Given 02/29/16 1018)   ondansetron (ZOFRAN) injection 4 mg (4 mg IntraVENous Given 02/29/16 1006)  HYDROmorphone (PF) (DILAUDID) injection 1 mg (1 mg IntraVENous Given 02/29/16 1005)   dextrose (D50W) injection syrg 25 g (25 g IntraVENous Given 02/29/16 1004)       Pulse Oximetry Analysis - Normal 100% on RA     Cardiac Monitor:   Rate: 105   Rhythm: Sinus Tachycardia     EKG interpretation: (Preliminary) 0508  Rhythm: sinus tachycardia; and regular . Rate (approx.): 102 bpm; Axis: normal; P wave: normal; QRS interval: normal ; ST/T wave: nonspecific ST/T changes, no ST elevation, no ST depression;   PR interval 186 ms, QRS 86 ms, QT 364 ms, QTc 474 ms.  Written by Betsey Amen ED Scribe, as dictated by Neale Burly, MD.      Diagnosis   Clinical Impression:   1. Acute hyperkalemia    2. Abdominal pain, generalized    3. Diarrhea, unspecified type    4. ESRD (end stage renal disease) ([place])    5. Pleural effusion    6. Hypocalcemia    7. Elevated CPK         Plan:  1: Admit    Disposition Note:  Admit Note:  11:16 AM  Patient is being admitted to the hospital by Dr. Waunita Schooner.  The results of their tests and reasons for their admission have been discussed with them and/or available family.  They convey agreement and understanding for the need to be admitted and for their admission diagnosis.  Consultation has been made with the inpatient physician specialist for hospitalization.  _______________________________   Attestations:     This note is prepared by Betsey Amen, acting as Scribe for [employer]. Lavone Neri, MD.    Ellin Mayhew. Lavone Neri, MD: The scribe's documentation has been prepared under my direction and personally reviewed by me in its entirety. I confirm that the note above accurately reflects all work, treatment, procedures, and medical decision making performed by me.    _______________________________

## 2016-02-29 NOTE — ED Triage Notes (Signed)
Patient arrives ambulatory to ED with a report of abdominal pain; N/V/D x3days. Patient states he is a dialysis patient and receives dialysis M/W/F. However, patient was too ill on Friday for dialysis. Patient reports swelling to his left arm where is fistula is; stating "it feels like it is going to burst open."

## 2016-02-29 NOTE — Progress Notes (Signed)
Bedside and Verbal shift change report given to Kemeka (oncoming nurse) by Julia (offgoing nurse). Report included the following information SBAR, Kardex, ED Summary, OR Summary, Procedure Summary, Intake/Output, MAR, Accordion, Recent Results and Med Rec Status.   ??   ??

## 2016-02-29 NOTE — ED Notes (Signed)
Code Purple called at this time.

## 2016-02-29 NOTE — ED Notes (Signed)
Routine rounding on pt.  Pt resting comfortably in bed.  Call bell within reach.  Pt has no requests at this time.

## 2016-02-29 NOTE — Progress Notes (Signed)
[facility] Vascular Lab Technologist Preliminary Report:  Venous Duplex Arm    Left arm venous duplex was performed.    Left jugular, brachial, radial and ulnar veins with normal Doppler characteristics.    Patient couldn't tolerate pressure for compression of the left jugular vein, it does demonstrate flow.  Brachial, radial and ulnar veins do compress.    Basilic and cephalic veins are being used for avf graft.    Hd cath is visualized.    Final report to follow.

## 2016-02-29 NOTE — ED Notes (Signed)
2 attempts of IV access to patient at this time; unsuccessful. MD notified.

## 2016-02-29 NOTE — ED Notes (Signed)
Patient placed on contact precautions at this time.

## 2016-02-29 NOTE — Consults (Signed)
Consults by Andrez GrimeMcNeer,  Jessica Seidman T, MD at 02/29/16 [id number]                Author: Andrez GrimeMcNeer, Haily Caley T, MD  Service: Nephrology  Author Type: Physician       Filed: 02/29/16 1625  Date of Service: 02/29/16 0843  Status: Signed          Editor: Solei Wubben, Theora GianottiMary T, MD (Physician)                        Consultation Note      NAME: David [name]    DOB:  17-Aug-1970    MRN:  [id number]       Date/Time:   02/29/2016 8:43 AM      I have been asked to see this patient by Dr. Providence LaniusHowell  for advice/opinion re: esrd/hyperkalemia.       Assessment :    Plan :      esrd   Hyperkalemia   Mild pulmonary edema on cxr   Uncontrolled htn   Perm cath   Failed AV accesses   Anemia of CKD      Came to ER with abdominal pain, n/v/d, fever and sob  MWF HD in [name]; missed HD on Friday; 2.5 hour HD today for removal of K and volume; HD again tomorrow to keep on MWF HD schedule      Ask Vascular surgery to eval re arm swelling tomorrow?             Subjective:     CHIEF COMPLAINT:  esrd      HISTORY OF PRESENT ILLNESS:      David [name] is a 45 y.o.    male who has a history of esrd. [name] missed HD on Friday. He is here with abdominal pain and diarrhea. Mild sob. Uses a perm cath for  HD. Swelling in left arm.        Past Medical History:        Diagnosis  Date         ?  Ascites       ?  Asthma       ?  C. difficile colitis       ?  CKD (chronic kidney disease) stage V requiring chronic dialysis (HCC)       ?  HTN (hypertension)           ?  SBP (spontaneous bacterial peritonitis) [facility](HCC)             Past Surgical History:         Procedure  Laterality  Date          ?  HX HERNIA REPAIR              Social History       Substance Use Topics         ?  Smoking status:  Heavy Tobacco Smoker              Packs/day:  0.50         Years:  10.00         ?  Smokeless tobacco:  Never Used         ?  Alcohol use  No         History reviewed. No pertinent family history.      Allergies        Allergen  Reactions         ?  Lisinopril  Shortness of Breath     ?   Morphine  Shortness of Breath         ?  Toradol [Ketorolac]  Shortness of Breath           Prior to Admission medications             Medication  Sig  Start Date  End Date  Taking?  Authorizing Provider            ondansetron (ZOFRAN ODT) 8 mg disintegrating tablet  Take 1 Tab by mouth every eight (8) hours as needed for Nausea.  01/15/16      Lanell Persons, MD     albuterol (PROVENTIL HFA, VENTOLIN HFA, PROAIR HFA) 90 mcg/actuation inhaler  Take 2 Puffs by inhalation every six (6) hours as needed for Wheezing.  12/19/15      Loni Beckwith, MD     methylPREDNISolone (MEDROL, PAK,) 4 mg tablet  Use as directed  12/19/15      Loni Beckwith, MD     sevelamer (RENAGEL) 400 mg tablet  Take 800 mg by mouth three (3) times daily (with meals).        Phys Other, MD     carvedilol (COREG) 12.5 mg tablet  Take  by mouth two (2) times daily (with meals).        Phys Other, MD     hydrALAZINE (APRESOLINE) 100 mg tablet  Take 100 mg by mouth three (3) times daily.        Phys Other, MD     amLODIPine (NORVASC) 10 mg tablet  Take 10 mg by mouth daily.        Phys Other, MD     pantoprazole (PROTONIX) 40 mg tablet  Take 40 mg by mouth daily.        Phys Other, MD     furosemide (LASIX) 80 mg tablet  Take 80 mg by mouth daily.        Phys Other, MD            albuterol (PROVENTIL HFA, VENTOLIN HFA, PROAIR HFA) 90 mcg/actuation inhaler  Take 2 Puffs by inhalation every four (4) hours as needed for Wheezing.        Phys Other, MD        REVIEW OF SYSTEMS:      []    Unable to obtain reliable ROS due to  []   mental status  []   sedated   []   intubated    [x]   Total of 12 systems reviewed as follows:   Constitutional: negative fever, negative chills, negative weight loss   Eyes:   negative visual changes   ENT:   negative sore throat, tongue or lip swelling   Respiratory:  negative cough, negative dyspnea   Cards:  negative for chest pain, palpitations, lower extremity edema   GI:   +for  diarrhea, and abdominal pain   GU:  negative for  frequency, dysuria   Integument:  negative for rash and pruritus   Heme:  negative for easy bruising and gum/nose bleeding   Musculoskel: negative for myalgias,  back pain and muscle weakness   Neuro:  negative for headaches, dizziness, vertigo   Psych:  negative for feelings of anxiety, depression    Travel?: none        Objective:     VITALS:       Visit Vitals         ?  BP  149/86     ?  Pulse  96     ?  Temp  98.3 ??F (36.8 ??C)     ?  Resp  18     ?  Ht  6\' 1"  (1.854 m)     ?  Wt  111.5 kg (245 lb 13 oz)     ?  SpO2  100%         ?  BMI  32.43 kg/m2        PHYSICAL EXAM:   Gen:  [x]   WD [x]   WN  []  cachectic []   thin []   obese []   disheveled              []   ill apearing  []    Critical  []     Chronic    [x]    No acute distress      HEENT:   [x]   NC/AT/PERRLA/EOMI     []   pink conjunctivae      []  pale conjunctivae                   PERRL  []   yes  []   no      []   moist mucosa    []   dry mucosa     hearing intact to voice [x]   yes  []   No                   NECK:   supple [x]   yes  []   no        masses []  yes  []  No                thyroid  []    non tender  []    tender      RESP:   [x]   CTA bilaterally/no wheezing/rhonchi/rales/crackles     []   rhonchi bilaterally - no dullness  []  wheezing   []  rhonchi   []  crackles      use of accessory muscles []   yes [x]   no      CARD:   [x]    regular rate and rhythm/No murmurs/rubs/gallops     murmur  []   yes ()  []   no      Rubs  []   yes  []   no       Gallops []   yes  []   no     Rate []    regular  []    irregular        carotid bruits  []  Right  []   Left                  LE edema []   yes  [x]   no           JVP  []   yes   []   no      ABD:    [x]   soft/non distended/non tender/+bowel sounds/no HSM     []    Rigid    tenderness []  yes []  no   Liver enlargement  []    yes []    no                 Spleen enlargement  []    yes []    no     distended []    yes []   no      bowel sound  []   hypoactive   []   hyperactive      LYMPH:    Neck []   yes [x]   no       Axillae []   yes []    no       SKIN:   Rashes []   yes   [x]   no    Ulcers []   yes   []    no                []   tight to palpitation    skin turgor []   good  []  poor  []  decreased                Cyanosis/clubbing []   yes []   no      NEUR:   [x]   cranial nerves II-XII grossly intact        []   Cranial nerves deficit                  []   facial droop    []    slurred speech   []   aphasic      []  Strength normal     []   weakness  []    LUE  []     RUE/ []    LLE  []     RLE     follows commands  [x]   yes []   no             PSYCH:   insight []  poor []  good   Alert and Oriented to  [x]   person  [x]   place  [x]    time                     []   depressed []   anxious []   agitated  []   lethargic []   stuporous  []   sedated       LAB DATA REVIEWED:       Recent Labs            02/29/16    0621     WBC   5.4     HGB   8.3*     HCT   25.7*        PLT   119*          Recent Labs            02/29/16    0621     NA   140     K   6.0*     CL   104     CO2   23     BUN   57*     CREA   11.20*     GLU   74        CA   6.6*          Recent Labs            02/29/16    0621     SGOT   19     ALT   14     AP   221*     TBILI   0.4     ALB   2.4*     GLOB   3.7        LPSE   486*        No results for input(s): INR, PTP, APTT in the last 72 hours.      No lab exists for component: INREXT    No results for input(s): FE, TIBC, PSAT, FERR in the last 72 hours.    No results for input(s): PH, PCO2, PO2 in the last 72 hours.  No results for input(s): CPK, CKMB in the last 72 hours.      No lab exists for component: TROPONINI   No results found for: GLUCPOC      Procedures: see electronic medical records for all procedures/Xrays and details which were not copied into this note but were reviewed prior to creation of Plan.     ________________________________________________________________________          ___________________________________________________   Consulting Physician: Andrez GrimeMary T Yovanna Cogan, MD

## 2016-03-01 ENCOUNTER — Inpatient Hospital Stay: Admit: 2016-03-01 | Payer: MEDICARE

## 2016-03-01 LAB — CELL COUNT, BODY FLUID
FLD EOSINS: 9 %
FLD LYMPHS: 36 %
FLD MONO/MACROPHAGES: 2 %
FLD NEUTROPHILS: 18 %
FLUID MESOTHELIAL: 35 %
FLUID RBC CT.: >100 /mm3
FLUID WBC COUNT: 238 /mm3 — ABNORMAL HIGH (ref 0–5)

## 2016-03-01 LAB — CBC WITH AUTOMATED DIFF
ABS. BASOPHILS: 0 10*3/uL (ref 0.0–0.1)
ABS. EOSINOPHILS: 0.3 10*3/uL (ref 0.0–0.4)
ABS. LYMPHOCYTES: 0.7 10*3/uL — ABNORMAL LOW (ref 0.8–3.5)
ABS. MONOCYTES: 0.5 10*3/uL (ref 0.0–1.0)
ABS. NEUTROPHILS: 5.2 10*3/uL (ref 1.8–8.0)
BASOPHILS: 0 % (ref 0–1)
EOSINOPHILS: 5 % (ref 0–7)
HCT: 24.6 % — ABNORMAL LOW (ref 36.6–50.3)
HGB: 8.2 g/dL — ABNORMAL LOW (ref 12.1–17.0)
LYMPHOCYTES: 10 % — ABNORMAL LOW (ref 12–49)
MCH: 30.5 PG (ref 26.0–34.0)
MCHC: 33.3 g/dL (ref 30.0–36.5)
MCV: 91.4 FL (ref 80.0–99.0)
MONOCYTES: 7 % (ref 5–13)
NEUTROPHILS: 78 % — ABNORMAL HIGH (ref 32–75)
PLATELET: 123 10*3/uL — ABNORMAL LOW (ref 150–400)
RBC: 2.69 M/uL — ABNORMAL LOW (ref 4.10–5.70)
RDW: 18.2 % — ABNORMAL HIGH (ref 11.5–14.5)
WBC: 6.7 10*3/uL (ref 4.1–11.1)

## 2016-03-01 LAB — ALBUMIN, FLUID: Albumin, body fld.: 1.6 g/dL

## 2016-03-01 LAB — PROTEIN TOTAL, FLUID: Protein total, body fld.: 3.5 g/dL

## 2016-03-01 LAB — C. DIFFICILE (DNA): C. difficile (DNA): POSITIVE — AB

## 2016-03-01 MED ORDER — LIDOCAINE (PF) 10 MG/ML (1 %) IJ SOLN
10 mg/mL (1 %) | Freq: Once | INTRAMUSCULAR | Status: AC
Start: 2016-03-01 — End: 2016-03-01
  Administered 2016-03-01: 19:00:00 via SUBCUTANEOUS

## 2016-03-01 MED ORDER — HYDROMORPHONE (PF) 1 MG/ML IJ SOLN
1 mg/mL | INTRAMUSCULAR | Status: DC | PRN
Start: 2016-03-01 — End: 2016-03-10
  Administered 2016-03-01 – 2016-03-10 (×66): via INTRAVENOUS

## 2016-03-01 MED ORDER — ALBUTEROL SULFATE HFA 90 MCG/ACTUATION AEROSOL INHALER
90 mcg/actuation | Freq: Four times a day (QID) | RESPIRATORY_TRACT | Status: DC | PRN
Start: 2016-03-01 — End: 2016-03-10
  Administered 2016-03-01 – 2016-03-06 (×3): via RESPIRATORY_TRACT

## 2016-03-01 MED ORDER — HYDROMORPHONE (PF) 1 MG/ML IJ SOLN
1 mg/mL | Freq: Once | INTRAMUSCULAR | Status: AC
Start: 2016-03-01 — End: 2016-03-01
  Administered 2016-03-01 – 2016-03-02 (×2): via INTRAVENOUS

## 2016-03-01 MED ORDER — CLONIDINE 0.1 MG TAB
0.1 mg | Freq: Two times a day (BID) | ORAL | Status: DC
Start: 2016-03-01 — End: 2016-03-09
  Administered 2016-03-01 – 2016-03-04 (×3): via ORAL

## 2016-03-01 MED ORDER — PATIROMER CALCIUM SORBITEX 8.4 GRAM ORAL POWDER PACKET
8.4 gram | Freq: Every day | ORAL | Status: DC
Start: 2016-03-01 — End: 2016-03-10
  Administered 2016-03-05: 23:00:00 via ORAL

## 2016-03-01 MED FILL — HYDROMORPHONE (PF) 1 MG/ML IJ SOLN: 1 mg/mL | INTRAMUSCULAR | Qty: 2

## 2016-03-01 MED FILL — VELTASSA 8.4 GRAM ORAL POWDER PACKET: 8.4 gram | ORAL | Qty: 1

## 2016-03-01 MED FILL — BD POSIFLUSH NORMAL SALINE 0.9 % INJECTION SYRINGE: INTRAMUSCULAR | Qty: 10

## 2016-03-01 MED FILL — VENTOLIN HFA 90 MCG/ACTUATION AEROSOL INHALER: 90 mcg/actuation | RESPIRATORY_TRACT | Qty: 8

## 2016-03-01 MED FILL — RENAGEL 800 MG TABLET: 800 mg | ORAL | Qty: 1

## 2016-03-01 MED FILL — CLONIDINE 0.1 MG TAB: 0.1 mg | ORAL | Qty: 1

## 2016-03-01 MED FILL — CARVEDILOL 12.5 MG TAB: 12.5 mg | ORAL | Qty: 1

## 2016-03-01 MED FILL — ONDANSETRON (PF) 4 MG/2 ML INJECTION: 4 mg/2 mL | INTRAMUSCULAR | Qty: 2

## 2016-03-01 MED FILL — AMLODIPINE 5 MG TAB: 5 mg | ORAL | Qty: 2

## 2016-03-01 NOTE — Progress Notes (Signed)
Small abdominal area noted bleeding adjacent to paracentesis site.  Clean dry dressing applied.  Will continue to monitor as needed.

## 2016-03-01 NOTE — Progress Notes (Signed)
Spiritual Care Assessment/Progress Notes    David [name] [id number]  [id number]    01-15-1971  45 y.o.  male    Patient Telephone Number: [phone] (home)   Religious Affiliation: [employer]   Language: English   Extended Emergency Contact Information  Primary Emergency Contact: [facility]   [place]  Home Phone: [phone]  Relation: Unknown   Patient Active Problem List    Diagnosis Date Noted   ??? Diarrhea 02/29/2016   ??? Peritonitis (HCC) 11/21/2015   ??? Gastroenteritis 10/14/2015        Date: 03/01/2016       Level of Religious/Spiritual Activity:  [x]          Involved in faith tradition/spiritual practice    []          Not involved in faith tradition/spiritual practice  [x]          Spiritually oriented    []          Claims no spiritual orientation    []          seeking spiritual identity  []          Feels alienated from religious practice/tradition  []          Feels angry about religious practice/tradition  [x]          Spirituality/religious tradition is a Theatre stage managerresource for coping at this time.  []          Not able to assess due to medical condition    Services Provided Today:  []          crisis intervention    []          reading Scriptures  [x]          spiritual assessment    []          prayer  [x]          empathic listening/emotional support  []          rites and rituals (cite in comments)  []          life review     []          religious support  []          theological development   []          advocacy  []          ethical dialog     []          blessing  []          bereavement support    []          support to family  []          anticipatory grief support   []          help with AMD  []          spiritual guidance    []          meditation      Spiritual Care Needs  [x]          Emotional Support  [x]          Spiritual/Religious Care  []          Loss/Adjustment  []          Advocacy/Referral                /Ethics  []          No needs expressed at               this time  []   Other: (note in                comments)  Spiritual Care Plan  []          Follow up visits with               pt/family  []          Provide materials  []          Schedule sacraments  []          Contact Community               Clergy  [x]          Follow up as needed  []          Other: (note in               comments)     Initial Spiritual Assessment in 3239. Led pt in processing while providing active listening. Pt provided events leading to hospitalization along with associated feelings. Stated he was visiting with sister in local area when he got sick, [place]. Identified his faith in God as a source of strength. Affirmed faith and prayed with pt. Advised of availability.  Thatcher Doberstein West EndMendizabal, M.Div  Chaplain

## 2016-03-01 NOTE — Progress Notes (Signed)
Patient arrived from dialysis in bed on room air. Alert and oriented X 3. Consent obtained

## 2016-03-01 NOTE — Progress Notes (Signed)
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

## 2016-03-01 NOTE — Other (Deleted)
1.    The [facility] has issued a statement indicating that, "Individuals who are overweight, obese, or morbidly obese are at an increased risk for certain medical conditions when compared to persons of normal weight.  Therefore, these conditions are always clinically significant and reportable when documented by the provider."    Review of the documentation for this patient demonstrates the clinical indicators of a BMI of 32.4  (height  of  6??? 1??????  with weight of  111.5 kg ).  Please clarify if the patient has a diagnosis associated with those findings:  _____  Obesity (BMI of 30-39.9)   _____  Morbid Obesity  (BMI 40 or greater)  ______Overweight (BMI 25-29.9)  _____  Other weight status (specify  status)  _____  Unable to determine    Thanks for your time.    Kara MeadKay Peters RN, BSN  [phone]  [phone]

## 2016-03-01 NOTE — Progress Notes (Signed)
Drained of red fluid from abdomin and labs sent.

## 2016-03-01 NOTE — Procedures (Signed)
[facility] [place]  [phone]    Vitals   Pre   Post   Assessment   Pre   Post     Temp  Temp: 98.3 ??F (36.8 ??C) (03/01/16 0826)  98.4 LOC  AOx4 same   HR   Pulse (Heart Rate): 93 (03/01/16 0826) 112   Lungs   Diminished, coarse posterior bases same   B/P   BP: 151/80 (03/01/16 0826) 180/80 Cardiac   Sinus tachy same    Resp   Resp Rate: 18 (03/01/16 0826) 18 Skin   CDI, dry BLE same   Pain level  10 10 Edema  2+ RUA, BLE, 3+LUA, nonpitting     same   Orders:    Duration:   Start:   08:26 End:   11:07 Total:   2.5h   Dialyzer:   Dialyzer/Set Up Inspection: Revaclear (03/01/16 0826)   K Bath:   Dialysate K (mEq/L): 2 (03/01/16 0826)   Ca Bath:   Dialysate CA (mEq/L): 2.5 (03/01/16 0826)   Na/Bicarb:   Dialysate NA (mEq/L): 140 (03/01/16 0826)   Target Fluid Removal:   Goal/Amount of Fluid to Remove (mL): 2500 mL (03/01/16 0826)   Access     Type & Location:   Left IJ permacath, Dressing CDI no S/S of infection. No issues with aspiration or flushing. Running well at BFR 400   Labs     Obtained/Reviewed   Critical Results Called   Date when labs were drawn-  Hgb-    HGB   Date Value Ref Range Status   03/01/2016 8.2 (L) 12.1 - 17.0 g/dL Final     K-    Potassium   Date Value Ref Range Status   02/29/2016 6.0 (H) 3.5 - 5.1 mmol/L Final     Ca-   Calcium   Date Value Ref Range Status   02/29/2016 6.6 (L) 8.5 - 10.1 MG/DL Final     Bun-   BUN   Date Value Ref Range Status   02/29/2016 57 (H) 6 - 20 MG/DL Final     Creat-   Creatinine   Date Value Ref Range Status   02/29/2016 11.20 (H) 0.70 - 1.30 MG/DL Final        Medications/ Blood Products Given     Name   Dose   Route and Time     none                Blood Volume Processed (BVP):    57.5 L Net Fluid   Removed:  1530 ml   Comments   Time Out Done: 08:22  Primary Nurse Rpt Pre: Maurene CapesKamika James RN  Primary Nurse Rpt Post: Benedetto CoonsAmy Pabst RN  Pt Education: Post treatment cramping  Care Plan: ongoing  Tx Summary:   08:26 Patient arrived to HD suite AOx4. Patient complains of pain 10/10, states he has scheduled pain medication due at 09:00. Notified primary RN that patient is having pain and has scheduled pain meds due.  08:34- [place] cath disinfected with Alcohol per policy.  Each lumen aspirated for blood return and flushed with Normal Saline per policy. Dialysis initiated.  09:00- Patient resting. VSS. Primary RN gave dilaudid 2mg  IV.  Primary RN called HD to reassess pain at 09:41 patient rates pain 9/10  10:00- Patient appears to be sleeping. VSS  10:55- Patient complaining of leg cramps. Gave patient 100 ml NS bolus. Patient states cramps are unrelieved and he wants to terminate  dialysis tx early. Educated patient that his potassium is elevated and he needs to continue treatment. Patient refusing to continue with treatment demanding to be taken off.   11:00- MD Notified of patient complaints and demanding to be taken off of dialysis.  11:07- Dialysis treatment terminated. Left chest permacath catheter flushed with normal saline per policy. Ports disinfected with Alcohol per policy and lines disconnected and capped using aseptic technique. Changed central line dressing per policy.  11:17- Patient sitting up on side of bed, states cramping has resolved. Bp 176/85, VSS  No further complaints. SBAR given to Benedetto CoonsAmy Pabst RN and patient sent to X-ray recovery for paracentesis. Terminal clean called for suite.    Admiting Diagnosis: N/V/D R/O c-diff  Pt's previous clinic- [facility] in [place]  Consent signed - Informed Consent Verified: Yes (03/01/16 [id number])  [facility] Consent - on file  Hepatitis Status- negative, >10, 09/05/15  Machine #- Machine Number: [id number] (03/01/16 [id number])  Telemetry status- Sinus tachy  Pre-dialysis wt.-  n/a

## 2016-03-01 NOTE — Progress Notes (Signed)
Hospitalist Progress Note  NAME: David [name]   DOB:  May 25, 1970   MRN:  [id number]       Assessment / Plan:  Diarrhea, hx of C. Diff in October per pt.  -Pt with abdominal pain, nausea, vomiting, and decreased PO intake  -Will send for stool studies including C.diff - all still pending  -In the interim, place on enteric precautions  -Last abx use was last month and he supposedly received Flagyl and Vanco  -WBC is only 6.7  -Continue to monitor  -Antiemetics PRN  -Pain meds as needed  -Try Clear Liquid diet and see if pt tolerates it  -Will not start abx at this point - BCx pending    ESRD on HD MWF for 7 years, missed 11/10 session  Hyperkalemia  LUE swelling  -K 6.0, no peaked T waves on EKG  -Given hyperkalemia cocktail by ED  -Monitor labs, repeat ordered   -AVF LUE is not working as per the pt, which is why he has a L chest catheter  -LUE US pending - evaluating for clot - pt to be seen by Vasc Surgery - repeat fistulogram planned  -Nephrology involved - declined dialysis last night due to pain, agreed for it this AM  -Had lengthy discussion in regards to compliance with treatment - pt agreeable currently but has underlying compliance issues it seems    Hypertension, uncontrolled  -Should improve with dialysis, will slowly start home meds  -BP currently 158/83    Recurrent Ascites  -CT AP noted, no evidence of cirrhosis - pt denies EtOH use in the past 10 years   -States that he has been worked up for this in the past and it is not because of cirrhosis  -Will continue to monitor, low concern for SBP - hopeful that dialysis will help to remove some fluid  -SAAG from previous admission shows 1.1, therefore may be due to portal hypertension - will continue to investigate. AST only 19, ALT 14  -Pt for IR guided paracentesis today - relevant labs ordered. Ascitic cell count also sent, while concern for SBP remains low, if PMN>250, start Ceftriaxone    Body mass index is 32.43 kg/(m^2).    Code status: Full   Prophylaxis: SCD  Recommended Disposition: Home w/Family     Subjective:     Chief Complaint / Reason for Physician Visit  "My hand is swollen." Asked why he didn't go to dialysis last night he states "I felt bad man".  Discussed with RN events overnight.     Review of Systems:  Symptom Y/N Comments  Symptom Y/N Comments   Fever/Chills n   Chest Pain n    Poor Appetite n   Edema     Cough    Abdominal Pain y    Sputum    Joint Pain     SOB/DOE n   Pruritis/Rash     Nausea/vomit n   Tolerating PT/OT y    Diarrhea n   Tolerating Diet y    Constipation n   Other       Could NOT obtain due to:      Objective:     VITALS:   Last 24hrs VS reviewed since prior progress note. Most recent are:  Patient Vitals for the past 24 hrs:   Temp Pulse Resp BP SpO2   03/01/16 1306 - (!) 103 16 159/83 94 %   03/01/16 1256 - (!) 101 18 (!) 165/91 96 %  03/01/16 1227 98.6 ??F (37 ??C) (!) 102 18 (!) 160/95 93 %   03/01/16 1117 - (!) 113 16 176/85 -   03/01/16 1107 98.4 ??F (36.9 ??C) (!) 104 16 180/80 -   03/01/16 1100 - (!) 112 16 (!) 195/97 -   03/01/16 1030 - (!) 107 16 177/86 -   03/01/16 1000 - (!) 104 16 168/81 -   03/01/16 0930 - (!) 101 18 180/61 -   03/01/16 0900 - 96 18 160/86 -   03/01/16 0826 98.3 ??F (36.8 ??C) 93 18 151/80 -   03/01/16 0305 98.2 ??F (36.8 ??C) 95 20 (!) 172/95 95 %   02/29/16 2236 98.5 ??F (36.9 ??C) 94 18 155/88 97 %   02/29/16 1844 97.9 ??F (36.6 ??C) 89 16 155/89 98 %   02/29/16 1410 97.8 ??F (36.6 ??C) 97 14 181/90 100 %   02/29/16 1330 - 99 14 165/85 99 %   02/29/16 1315 - 90 17 (!) 172/95 91 %       Intake/Output Summary (Last 24 hours) at 03/01/16 1307  Last data filed at 03/01/16 1107   Gross per 24 hour   Intake                0 ml   Output             1530 ml   Net            -1530 ml        PHYSICAL EXAM:  General: WD, WN. Alert, cooperative, no acute distress????  EENT:  EOMI. Anicteric sclerae. MMM  Resp:  Crackles bibasilar  CV:  Regular  rhythm,?? No edema  GI:  Soft, non tender, distended, BS+   Neurologic:?? Alert and oriented X 3, normal speech, no focal deficits  Psych:???? Fair insight.??Not anxious nor agitated  Skin:  No rashes.  No jaundice.  MSK:  LUE swelling, radial pulse palpated, warm, good cap refill less than 2 seconds    Reviewed most current lab test results and cultures  YES  Reviewed most current radiology test results   YES  Review and summation of old records today    NO  Reviewed patient's current orders and MAR    YES  PMH/SH reviewed - no change compared to H&P  ________________________________________________________________________  Care Plan discussed with:    Comments   Patient y    Family      RN y    Buyer, retailCare Manager     Consultant  y                      Multidiciplinary team rounds were held today with case manager, nursing, pharmacist and Higher education careers adviserclinical coordinator.  Patient's plan of care was discussed; medications were reviewed and discharge planning was addressed.     ________________________________________________________________________  Total NON critical care TIME:  30   Minutes    Total CRITICAL CARE TIME Spent:   Minutes non procedure based      Comments   >50% of visit spent in counseling and coordination of care y    ________________________________________________________________________  Earvin HansenSamip S Yves Fodor, MD     Procedures: see electronic medical records for all procedures/Xrays and details which were not copied into this note but were reviewed prior to creation of Plan.      LABS:  I reviewed today's most current labs and imaging studies.  Pertinent labs include:  Recent Labs  03/01/16   0256  02/29/16   0621   WBC  6.7  5.4   HGB  8.2*  8.3*   HCT  24.6*  25.7*   PLT  123*  119*     Recent Labs      02/29/16   0621   NA  140   K  6.0*   CL  104   CO2  23   GLU  74   BUN  57*   CREA  11.20*   CA  6.6*   ALB  2.4*   TBILI  0.4   SGOT  19   ALT  14       Signed: Earvin Hansen, MD

## 2016-03-01 NOTE — Consults (Addendum)
Progress Note    NAME: David [name]   DOB:  [birth year]   MRN:  [id number]     Date/Time:  03/01/2016     I have been asked to see this patient by Dr. Providence Lanius  for advice/opinion re: esrd/hyperkalemia.     Assessment :    Plan:  esrd  Hyperkalemia  Mild pulmonary edema on cxr  Uncontrolled htn  Perm cath  Failed AV accesses  Anemia of CKD  Swollen arm/hand     Seen on HD, bp stable  Pt signed off early  Using pc  k 6-dialyzed on a 2 k bath, added veltassa to help keep k under control  D/w dr. Clelia Croft have vascular see pt-D/w Dr. Manson Passey  Calcium corrects to normal-check phos/pth  Suspect component of non compliance with medical therapy  sw needs to start working on his outpatient unit-reportedly transferring [facility] [place]       Subjective:   CHIEF COMPLAINT:  My hand is swollen    Past Medical History:   Diagnosis Date   ??? Ascites    ??? Asthma    ??? C. difficile colitis    ??? CKD (chronic kidney disease) stage V requiring chronic dialysis (HCC)    ??? HTN (hypertension)    ??? SBP (spontaneous bacterial peritonitis) (HCC)       Past Surgical History:   Procedure Laterality Date   ??? HX HERNIA REPAIR       Social History   Substance Use Topics   ??? Smoking status: Heavy Tobacco Smoker     Packs/day: 0.50     Years: 10.00   ??? Smokeless tobacco: Never Used   ??? Alcohol use No      History reviewed. No pertinent family history.   Allergies   Allergen Reactions   ??? Lisinopril Shortness of Breath   ??? Morphine Shortness of Breath   ??? Toradol [Ketorolac] Shortness of Breath      Prior to Admission medications    Medication Sig Start Date End Date Taking? Authorizing Provider   ondansetron (ZOFRAN ODT) 8 mg disintegrating tablet Take 1 Tab by mouth every eight (8) hours as needed for Nausea. 01/15/16   Lanell Persons, MD   albuterol (PROVENTIL HFA, VENTOLIN HFA, PROAIR HFA) 90 mcg/actuation inhaler Take 2 Puffs by inhalation every six (6) hours as needed for Wheezing. 12/19/15   Loni Beckwith, MD    methylPREDNISolone (MEDROL, PAK,) 4 mg tablet Use as directed 12/19/15   Loni Beckwith, MD   sevelamer (RENAGEL) 400 mg tablet Take 800 mg by mouth three (3) times daily (with meals).    Phys Other, MD   carvedilol (COREG) 12.5 mg tablet Take  by mouth two (2) times daily (with meals).    Phys Other, MD   hydrALAZINE (APRESOLINE) 100 mg tablet Take 100 mg by mouth three (3) times daily.    Phys Other, MD   amLODIPine (NORVASC) 10 mg tablet Take 10 mg by mouth daily.    Phys Other, MD   pantoprazole (PROTONIX) 40 mg tablet Take 40 mg by mouth daily.    Phys Other, MD   furosemide (LASIX) 80 mg tablet Take 80 mg by mouth daily.    Phys Other, MD   albuterol (PROVENTIL HFA, VENTOLIN HFA, PROAIR HFA) 90 mcg/actuation inhaler Take 2 Puffs by inhalation every four (4) hours as needed for Wheezing.    Phys Other, MD     REVIEW OF SYSTEMS:     (+)  hand pain/swelling    Objective:   VITALS:    Visit Vitals   ??? BP 177/86   ??? Pulse (!) 107   ??? Temp 98.3 ??F (36.8 ??C) (Oral)   ??? Resp 16   ??? Ht 6\' 1"  (1.854 m)   ??? Wt 111.5 kg (245 lb 13 oz)   ??? SpO2 95%   ??? BMI 32.43 kg/m2     PHYSICAL EXAM:  Gen:  [x]   WD [x]   WN  []  cachectic []   thin []   obese []   disheveled             []   ill apearing  []    Critical  []    Chronic    [x]   No acute distress    HEENT:   [x]  NC/AT/PERRLA/EOMI    []  pink conjunctivae      []  pale conjunctivae                  PERRL  []  yes  []  no      []  moist mucosa    []  dry mucosa    hearing intact to voice [x]  yes  []  No                 NECK:   supple [x]  yes  []  no        masses []  yes  []  No               thyroid  []   non tender  []   tender    RESP:   [x]  CTA bilaterally/no wheezing/rhonchi/rales/crackles    []  rhonchi bilaterally - no dullness  []  wheezing   []  rhonchi   []  crackles     use of accessory muscles []  yes [x]  no    CARD:   [x]   regular rate and rhythm/No murmurs/rubs/gallops    murmur  []  yes ()  []  no      Rubs  []  yes  []  no       Gallops []  yes  []  no     Rate []   regular  []   irregular        carotid bruits  []  Right  []   Left                 LE edema []  yes  [x]  no              ABD:    []  soft/non distended/non tender/+bowel sounds/no HSM    []   Rigid    tenderness []  yes []  no   Liver enlargement  []   yes []   no                Spleen enlargement  []   yes []   no     distended []   yes []  no     bowel sound  []  hypoactive   []  hyperactive      SKIN:   Rashes []   yes   [x]   no    Ulcers []   yes   []   no               []  tight to palpitation    skin turgor []   good  []  poor  []  decreased               Cyanosis/clubbing []   yes []   no    NEUR:   [x]  cranial nerves II-XII grossly intact       []   Cranial nerves deficit                 []   facial droop    []   slurred speech   []  aphasic     []  Strength normal     []   weakness  []   LUE  []    RUE/ []   LLE  []    RLE    follows commands  [x]   yes []   no           PSYCH:   insight []  poor []  good   Alert and Oriented to  [x]  person  [x]  place  [x]   time                    []  depressed []  anxious []  agitated  []  lethargic []  stuporous  []  sedated     LAB DATA REVIEWED:    Recent Labs      03/01/16   0256  02/29/16   0621   WBC  6.7  5.4   HGB  8.2*  8.3*   HCT  24.6*  25.7*   PLT  123*  119*     Recent Labs      02/29/16   0621   NA  140   K  6.0*   CL  104   CO2  23   BUN  57*   CREA  11.20*   GLU  74   CA  6.6*     Recent Labs      02/29/16   0621   SGOT  19   ALT  14   AP  221*   TBILI  0.4   ALB  2.4*   GLOB  3.7   LPSE  486*     No results for input(s): INR, PTP, APTT in the last 72 hours.    No lab exists for component: INREXT, INREXT   No results for input(s): FE, TIBC, PSAT, FERR in the last 72 hours.   No results for input(s): PH, PCO2, PO2 in the last 72 hours.  Recent Labs      02/29/16   0621   CKMB  36.7*     Lab Results   Component Value Date/Time    Glucose (POC) 105 02/29/2016 11:05 AM       Procedures: see electronic medical records for all procedures/Xrays and  details which were not copied into this note but were reviewed prior to creation of Plan.    ________________________________________________________________________       ___________________________________________________  Consulting Physician: Gary Fleetrudy L Lorian Yaun, MD

## 2016-03-01 NOTE — Progress Notes (Signed)
Called report to Amy RN, Informed of procedure and labs sent.

## 2016-03-01 NOTE — Procedures (Signed)
 [facility] [place]  Acutes  [phone]    Vitals   Pre   Post   Assessment   Pre   Post     Temp  Temp: 98.3 F (36.8 C) (03/01/16 0826)  98.4 LOC  AOx4 same   HR   Pulse (Heart Rate): 93 (03/01/16 0826) 112   Lungs   Diminished, coarse posterior bases same   B/P   BP: 151/80 (03/01/16 0826) 180/80 Cardiac   Sinus tachy same    Resp   Resp Rate: 18 (03/01/16 0826) 18 Skin   CDI, dry BLE same   Pain level  10 10 Edema  2+ RUA, BLE, 3+LUA, nonpitting     same   Orders:    Duration:   Start:   08:26 End:   11:07 Total:   2.5h   Dialyzer:   Dialyzer/Set Up Inspection: Revaclear (03/01/16 0826)   K Bath:   Dialysate K (mEq/L): 2 (03/01/16 0826)   Ca Bath:   Dialysate CA (mEq/L): 2.5 (03/01/16 0826)   Na/Bicarb:   Dialysate NA (mEq/L): 140 (03/01/16 0826)   Target Fluid Removal:   Goal/Amount of Fluid to Remove (mL): 2500 mL (03/01/16 0826)   Access     Type & Location:   Left IJ permacath, Dressing CDI no S/S of infection. No issues with aspiration or flushing. Running well at BFR 400   Labs     Obtained/Reviewed   Critical Results Called   Date when labs were drawn-  Hgb-    HGB   Date Value Ref Range Status   03/01/2016 8.2 (L) 12.1 - 17.0 g/dL Final     K-    Potassium   Date Value Ref Range Status   02/29/2016 6.0 (H) 3.5 - 5.1 mmol/L Final     Ca-   Calcium   Date Value Ref Range Status   02/29/2016 6.6 (L) 8.5 - 10.1 MG/DL Final     Bun-   BUN   Date Value Ref Range Status   02/29/2016 57 (H) 6 - 20 MG/DL Final     Creat-   Creatinine   Date Value Ref Range Status   02/29/2016 11.20 (H) 0.70 - 1.30 MG/DL Final        Medications/ Blood Products Given     Name   Dose   Route and Time     none                Blood Volume Processed (BVP):    57.5 L Net Fluid   Removed:  1530 ml   Comments   Time Out Done: 08:22  Primary Nurse Rpt Pre: Wilmot Agent RN  Primary Nurse Rpt Post: Greig Maiden RN  Pt Education: Post treatment cramping  Care Plan: ongoing  Tx Summary:  08:26 Patient arrived to HD suite  AOx4. Patient complains of pain 10/10, states he has scheduled pain medication due at 09:00. Notified primary RN that patient is having pain and has scheduled pain meds due.  08:34- [place] cath disinfected with Alcohol per policy.  Each lumen aspirated for blood return and flushed with Normal Saline per policy. Dialysis initiated.  09:00- Patient resting. VSS. Primary RN gave dilaudid 2mg  IV.  Primary RN called HD to reassess pain at 09:41 patient rates pain 9/10  10:00- Patient appears to be sleeping. VSS  10:55- Patient complaining of leg cramps. Gave patient 100 ml NS bolus. Patient states cramps are unrelieved and he wants to terminate  dialysis tx early. Educated patient that his potassium is elevated and he needs to continue treatment. Patient refusing to continue with treatment demanding to be taken off.   11:00- MD Notified of patient complaints and demanding to be taken off of dialysis.  11:07- Dialysis treatment terminated. Left chest permacath catheter flushed with normal saline per policy. Ports disinfected with Alcohol per policy and lines disconnected and capped using aseptic technique. Changed central line dressing per policy.  11:17- Patient sitting up on side of bed, states cramping has resolved. Bp 176/85, VSS  No further complaints. SBAR given to Greig Maiden RN and patient sent to X-ray recovery for paracentesis. Terminal clean called for suite.    Admiting Diagnosis: N/V/D R/O c-diff  Pt's previous clinic- [facility] in [place]  Consent signed - Informed Consent Verified: Yes (03/01/16 9173)  [facility] Consent - on file  Hepatitis Status- negative, >10, 09/05/15  Machine #- Machine Number: [id number] (03/01/16 9173)  Telemetry status- Sinus tachy  Pre-dialysis wt.-  n/a

## 2016-03-01 NOTE — Consults (Signed)
Consults  by Gary Fleet, MD at 03/01/16 1142                Author: Gary Fleet, MD  Service: Nephrology  Author Type: Physician       Filed: 03/01/16 1153  Date of Service: 03/01/16 1142  Status: Addendum          Editor: Gary Fleet, MD (Physician)          Related Notes: Original Note by Gary Fleet, MD (Physician) filed at 03/01/16 1152                        Progress Note      NAME: David [name]    DOB:  12/24/1970    MRN:  [id number]       Date/Time:   03/01/2016       I have been asked to see this patient by Dr. Providence Lanius  for advice/opinion re: esrd/hyperkalemia.       Assessment :    Plan :      esrd   Hyperkalemia   Mild pulmonary edema on cxr   Uncontrolled htn   Perm cath   Failed AV accesses   Anemia of CKD   Swollen arm/hand        Seen on HD, bp stable   Pt signed off early   Using pc   k 6-dialyzed on a 2 k bath, added veltassa to help keep k under control   D/w dr. Clelia Croft have vascular see pt-D/w Dr. Manson Passey   Calcium corrects to normal-check phos/pth   Suspect component of non compliance with medical therapy   sw needs to start working on his outpatient unit-reportedly transferring from [facility] in [place]             Subjective:     CHIEF COMPLAINT:  My hand is swollen        Past Medical History:        Diagnosis  Date         ?  Ascites       ?  Asthma       ?  C. difficile colitis       ?  CKD (chronic kidney disease) stage V requiring chronic dialysis (HCC)       ?  HTN (hypertension)           ?  SBP (spontaneous bacterial peritonitis) [facility])             Past Surgical History:         Procedure  Laterality  Date          ?  HX HERNIA REPAIR              Social History       Substance Use Topics         ?  Smoking status:  Heavy Tobacco Smoker              Packs/day:  0.50         Years:  10.00         ?  Smokeless tobacco:  Never Used         ?  Alcohol use  No         History reviewed. No pertinent family history.      Allergies        Allergen   Reactions         ?  Lisinopril  Shortness of Breath     ?  Morphine  Shortness of Breath         ?  Toradol [Ketorolac]  Shortness of Breath           Prior to Admission medications             Medication  Sig  Start Date  End Date  Taking?  Authorizing Provider            ondansetron (ZOFRAN ODT) 8 mg disintegrating tablet  Take 1 Tab by mouth every eight (8) hours as needed for Nausea.  01/15/16      Lanell Personsara Marks, MD     albuterol (PROVENTIL HFA, VENTOLIN HFA, PROAIR HFA) 90 mcg/actuation inhaler  Take 2 Puffs by inhalation every six (6) hours as needed for Wheezing.  12/19/15      Loni BeckwithWilliam Azie, MD     methylPREDNISolone (MEDROL, PAK,) 4 mg tablet  Use as directed  12/19/15      Loni BeckwithWilliam Azie, MD     sevelamer (RENAGEL) 400 mg tablet  Take 800 mg by mouth three (3) times daily (with meals).        Phys Other, MD     carvedilol (COREG) 12.5 mg tablet  Take  by mouth two (2) times daily (with meals).        Phys Other, MD     hydrALAZINE (APRESOLINE) 100 mg tablet  Take 100 mg by mouth three (3) times daily.        Phys Other, MD     amLODIPine (NORVASC) 10 mg tablet  Take 10 mg by mouth daily.        Phys Other, MD     pantoprazole (PROTONIX) 40 mg tablet  Take 40 mg by mouth daily.        Phys Other, MD     furosemide (LASIX) 80 mg tablet  Take 80 mg by mouth daily.        Phys Other, MD            albuterol (PROVENTIL HFA, VENTOLIN HFA, PROAIR HFA) 90 mcg/actuation inhaler  Take 2 Puffs by inhalation every four (4) hours as needed for Wheezing.        Phys Other, MD        REVIEW OF SYSTEMS:      (+) hand pain/swelling        Objective:     VITALS:       Visit Vitals         ?  BP  177/86     ?  Pulse  (!) 107     ?  Temp  98.3 ??F (36.8 ??C) (Oral)     ?  Resp  16     ?  Ht  6\' 1"  (1.854 m)     ?  Wt  111.5 kg (245 lb 13 oz)     ?  SpO2  95%         ?  BMI  32.43 kg/m2        PHYSICAL EXAM:   Gen:  [x]   WD [x]   WN  []  cachectic []   thin []   obese []   disheveled              []   ill apearing  []    Critical  []     Chronic     [x]    No acute distress      HEENT:   [x]   NC/AT/PERRLA/EOMI     []   pink conjunctivae      []  pale conjunctivae                   PERRL  []   yes  []   no      []   moist mucosa    []   dry mucosa     hearing intact to voice [x]   yes  []   No                   NECK:   supple [x]   yes  []   no        masses []  yes  []  No                thyroid  []    non tender  []    tender      RESP:   [x]   CTA bilaterally/no wheezing/rhonchi/rales/crackles     []   rhonchi bilaterally - no dullness  []  wheezing   []  rhonchi   []  crackles      use of accessory muscles []   yes [x]   no      CARD:   [x]    regular rate and rhythm/No murmurs/rubs/gallops     murmur  []   yes ()  []   no      Rubs  []   yes  []   no       Gallops []   yes  []   no     Rate []    regular  []    irregular        carotid bruits  []  Right  []   Left                  LE edema []   yes  [x]   no                ABD:    []   soft/non distended/non tender/+bowel sounds/no HSM     []    Rigid    tenderness []  yes []  no   Liver enlargement  []    yes []    no                 Spleen enlargement  []    yes []    no     distended []    yes []   no      bowel sound  []   hypoactive   []   hyperactive         SKIN:   Rashes []   yes   [x]   no    Ulcers []   yes   []    no                []   tight to palpitation    skin turgor []   good  []  poor  []  decreased                Cyanosis/clubbing []   yes []   no      NEUR:   [x]   cranial nerves II-XII grossly intact        []   Cranial nerves deficit                  []   facial droop    []    slurred speech   []   aphasic      []  Strength normal     []   weakness  []    LUE  []     RUE/ []    LLE  []   RLE     follows commands  [x]   yes []   no             PSYCH:   insight []  poor []  good   Alert and Oriented to  [x]   person  [x]   place  [x]    time                     []   depressed []   anxious []   agitated  []   lethargic []   stuporous  []   sedated       LAB DATA REVIEWED:       Recent Labs             03/01/16    0256   02/29/16    0621     WBC   6.7   5.4     HGB    8.2*   8.3*     HCT   24.6*   25.7*         PLT   123*   119*          Recent Labs            02/29/16    0621     NA   140     K   6.0*     CL   104     CO2   23     BUN   57*     CREA   11.20*     GLU   74        CA   6.6*          Recent Labs            02/29/16    0621     SGOT   19     ALT   14     AP   221*     TBILI   0.4     ALB   2.4*     GLOB   3.7        LPSE   486*        No results for input(s): INR, PTP, APTT in the last 72 hours.      No lab exists for component: INREXT, INREXT    No results for input(s): FE, TIBC, PSAT, FERR in the last 72 hours.    No results for input(s): PH, PCO2, PO2 in the last 72 hours.     Recent Labs            02/29/16    0621        CKMB   36.7*          Lab Results         Component  Value  Date/Time            Glucose (POC)  105  02/29/2016 11:05 AM           Procedures: see electronic medical records for all procedures/Xrays and details which were not copied into this note but were reviewed prior to creation of Plan.     ________________________________________________________________________          ___________________________________________________   Consulting Physician: Gary Fleet, MD

## 2016-03-01 NOTE — Consults (Signed)
[facility]   [address]   [place], [place] [id number]   CONSULTATION       Name:  David [name], David D   MR#:  [id number]   DOB:  1971-03-07   Account #:  [id number]    Date of Consultation:  03/01/2016   Date of Adm:  02/29/2016       CHIEF COMPLAINT: Left arm swelling.    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 He is a chronic   hemodialysis the patient [place] in [place] and   [place] who is currently dialyzing through a left IJ tunneled   catheter. He has a left upper arm fistula in place which he says is a   nonfunctional. I am asked to see him because during this   hospitalization, his left arm has become markedly swollen, which is a   new finding.    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    For his complete past medical, past surgical history, medications, drug   allergies, please see the complete admission history and physical.    PHYSICAL EXAMINATION   GENERAL: He is a healthy, comfortable-appearing gentleman in no   acute distress.   HEAD AND NECK: Significant for a left IJ tunneled catheter. He has   collateral veins around his neck and shoulder.   LUNGS: Clear.   HEART: Regular.   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 He has a palpable thrill near the   anastomosis at the elbow, but no pulse or thrill more distally up the   arm in the aneurysmal areas.    IMPRESSION: Left arm edema in the face of dialysis access, central   venous stenoses and indwelling catheter. The patient almost certainly   has central venous cause for his edema. We will try to get a left arm   fistulogram and central venogram while he is an inpatient, and we can   discuss options  following clarification of his anatomy.        [name], MD      JAB / MP   D:  03/03/2016   16:55   T:  03/03/2016   17:13   Job #:  [id number]

## 2016-03-02 LAB — METABOLIC PANEL, BASIC
Anion gap: 13 mmol/L (ref 5–15)
BUN/Creatinine ratio: 5 — ABNORMAL LOW (ref 12–20)
BUN: 43 MG/DL — ABNORMAL HIGH (ref 6–20)
CO2: 23 mmol/L (ref 21–32)
Calcium: 6.6 MG/DL — ABNORMAL LOW (ref 8.5–10.1)
Chloride: 104 mmol/L (ref 97–108)
Creatinine: 8.86 MG/DL — ABNORMAL HIGH (ref 0.70–1.30)
GFR est AA: 8 mL/min/{1.73_m2} — ABNORMAL LOW (ref >60)
GFR est non-AA: 7 mL/min/{1.73_m2} — ABNORMAL LOW (ref >60)
Glucose: 78 mg/dL (ref 65–100)
Potassium: 5.5 mmol/L — ABNORMAL HIGH (ref 3.5–5.1)
Sodium: 140 mmol/L (ref 136–145)

## 2016-03-02 LAB — CBC W/O DIFF
HCT: 25.5 % — ABNORMAL LOW (ref 36.6–50.3)
HGB: 8 g/dL — ABNORMAL LOW (ref 12.1–17.0)
MCH: 29 PG (ref 26.0–34.0)
MCHC: 31.4 g/dL (ref 30.0–36.5)
MCV: 92.4 FL (ref 80.0–99.0)
PLATELET: 116 10*3/uL — ABNORMAL LOW (ref 150–400)
RBC: 2.76 M/uL — ABNORMAL LOW (ref 4.10–5.70)
RDW: 18.4 % — ABNORMAL HIGH (ref 11.5–14.5)
WBC: 6.3 10*3/uL (ref 4.1–11.1)

## 2016-03-02 LAB — PTH INTACT
Calcium: 6.6 MG/DL — ABNORMAL LOW (ref 8.5–10.1)
PTH, Intact: >1900 pg/mL — ABNORMAL HIGH (ref 14.0–72.0)

## 2016-03-02 LAB — CULTURE, STOOL
Campylobacter antigen: NEGATIVE
Shiga toxin-producing E. coli Ag: NEGATIVE

## 2016-03-02 LAB — MAGNESIUM: Magnesium: 1.8 mg/dL (ref 1.6–2.4)

## 2016-03-02 LAB — PHOSPHORUS: Phosphorus: 5.1 MG/DL — ABNORMAL HIGH (ref 2.6–4.7)

## 2016-03-02 MED ORDER — DOXERCALCIFEROL 4 MCG/2 ML IV SOLN
4 mcg/2 mL | INTRAVENOUS | Status: DC
Start: 2016-03-02 — End: 2016-03-10
  Administered 2016-03-04 – 2016-03-08 (×3): via INTRAVENOUS

## 2016-03-02 MED ORDER — ADV ADDAPTOR
750 mg | Status: DC | PRN
Start: 2016-03-02 — End: 2016-03-03

## 2016-03-02 MED ORDER — VANCOMYCIN ORAL SOLUTION 50 MG/ML CPD (RX COMPOUNDED)
50 mg/mL | Freq: Four times a day (QID) | ORAL | Status: DC
Start: 2016-03-02 — End: 2016-03-02

## 2016-03-02 MED ORDER — ADV ADDAPTOR
3.375 gram | Freq: Two times a day (BID) | Status: DC
Start: 2016-03-02 — End: 2016-03-02

## 2016-03-02 MED ORDER — ALBUMIN, HUMAN 25 % IV
25 % | Freq: Once | INTRAVENOUS | Status: AC
Start: 2016-03-02 — End: 2016-03-02

## 2016-03-02 MED ORDER — FIDAXOMICIN 200 MG TAB
200 mg | Freq: Two times a day (BID) | ORAL | Status: DC
Start: 2016-03-02 — End: 2016-03-02
  Administered 2016-03-02: 23:00:00 via ORAL

## 2016-03-02 MED ORDER — CINACALCET 30 MG TAB
30 mg | Freq: Every day | ORAL | Status: DC
Start: 2016-03-02 — End: 2016-03-10
  Administered 2016-03-04 – 2016-03-09 (×6): via ORAL

## 2016-03-02 MED ORDER — ADV ADDAPTOR
3.375 gram | Status: DC
Start: 2016-03-02 — End: 2016-03-02

## 2016-03-02 MED ORDER — SODIUM CHLORIDE 0.9 % IV PIGGY BACK
2 gram | INTRAVENOUS | Status: DC
Start: 2016-03-02 — End: 2016-03-04
  Administered 2016-03-03 – 2016-03-04 (×2): via INTRAVENOUS

## 2016-03-02 MED ORDER — SODIUM CHLORIDE 0.9 % IV
10 gram | Freq: Once | INTRAVENOUS | Status: DC
Start: 2016-03-02 — End: 2016-03-02

## 2016-03-02 MED ORDER — OTHER(NON-FORMULARY)
Freq: Two times a day (BID) | Status: DC
Start: 2016-03-02 — End: 2016-03-02

## 2016-03-02 MED ORDER — METRONIDAZOLE IN SODIUM CHLORIDE (ISO-OSM) 500 MG/100 ML IV PIGGY BACK
500 mg/100 mL | Freq: Three times a day (TID) | INTRAVENOUS | Status: DC
Start: 2016-03-02 — End: 2016-03-02
  Administered 2016-03-02: 17:00:00 via INTRAVENOUS

## 2016-03-02 MED ORDER — PHARMACY INFORMATION NOTE
Freq: Every day | Status: DC
Start: 2016-03-02 — End: 2016-03-03
  Administered 2016-03-03: 14:00:00

## 2016-03-02 MED ORDER — VANCOMYCIN ORAL SOLUTION 50 MG/ML CPD (RX COMPOUNDED)
50 mg/mL | Freq: Four times a day (QID) | ORAL | Status: DC
Start: 2016-03-02 — End: 2016-03-02
  Administered 2016-03-02: 17:00:00 via ORAL

## 2016-03-02 MED ORDER — VANCOMYCIN ORAL SOLUTION 50 MG/ML CPD (RX COMPOUNDED)
50 mg/mL | Freq: Four times a day (QID) | ORAL | Status: DC
Start: 2016-03-02 — End: 2016-03-04
  Administered 2016-03-03 – 2016-03-04 (×7): via ORAL

## 2016-03-02 MED FILL — HYDROMORPHONE (PF) 1 MG/ML IJ SOLN: 1 mg/mL | INTRAMUSCULAR | Qty: 2

## 2016-03-02 MED FILL — VANCOMYCIN ORAL SOLUTION 50 MG/ML CPD (RX COMPOUNDED): 50 mg/mL | ORAL | Qty: 5

## 2016-03-02 MED FILL — DIFICID 200 MG TABLET: 200 mg | ORAL | Qty: 1

## 2016-03-02 MED FILL — PHARMACY INFORMATION NOTE: Qty: 1

## 2016-03-02 MED FILL — VELTASSA 8.4 GRAM ORAL POWDER PACKET: 8.4 gram | ORAL | Qty: 1

## 2016-03-02 MED FILL — METRONIDAZOLE IN SODIUM CHLORIDE (ISO-OSM) 500 MG/100 ML IV PIGGY BACK: 500 mg/100 mL | INTRAVENOUS | Qty: 100

## 2016-03-02 MED FILL — BD POSIFLUSH NORMAL SALINE 0.9 % INJECTION SYRINGE: INTRAMUSCULAR | Qty: 10

## 2016-03-02 MED FILL — CARVEDILOL 12.5 MG TAB: 12.5 mg | ORAL | Qty: 1

## 2016-03-02 MED FILL — RENAGEL 800 MG TABLET: 800 mg | ORAL | Qty: 1

## 2016-03-02 MED FILL — CLONIDINE 0.1 MG TAB: 0.1 mg | ORAL | Qty: 1

## 2016-03-02 MED FILL — ONDANSETRON (PF) 4 MG/2 ML INJECTION: 4 mg/2 mL | INTRAMUSCULAR | Qty: 2

## 2016-03-02 MED FILL — ZOSYN 3.375 GRAM INTRAVENOUS SOLUTION: 3.375 gram | INTRAVENOUS | Qty: 3.38

## 2016-03-02 MED FILL — AMLODIPINE 5 MG TAB: 5 mg | ORAL | Qty: 2

## 2016-03-02 MED FILL — VANCOMYCIN 10 GRAM IV SOLR: 10 gram | INTRAVENOUS | Qty: 3000

## 2016-03-02 MED FILL — ALBUMINAR 25 % INTRAVENOUS SOLUTION: 25 % | INTRAVENOUS | Qty: 100

## 2016-03-02 NOTE — Other (Cosign Needed)
Bedside and Verbal shift change report given to Amy (oncoming nurse) by Majel HomerKemeka RN (offgoing nurse). Report included the following information SBAR, Kardex, ED Summary, Intake/Output, MAR, Accordion, Recent Results and Med Rec Status.

## 2016-03-02 NOTE — Progress Notes (Signed)
Pt refusing tele monitor.

## 2016-03-02 NOTE — Progress Notes (Signed)
Progress Note    NAME: David [name]   DOB:  06-19-1970   MRN:  [id number]     Date/Time:  03/02/2016    Assessment :    Plan:  esrd  Hyperkalemia  Mild pulmonary edema on cxr  Uncontrolled htn  Perm cath  Failed AV accesses  Anemia of CKD  Swollen arm/hand     HD MWF  Pt signed off early yesterday  Using pc  k 6-dialyzed on a 2 k bath yesterday, added veltassa to help keep k under control  Dr. Manson PasseyBrown evaluating pt-has central stenosis-appreciate his help  Calcium corrects to normal-checked phos/pth > 1900-added iv hectoral with HD and sensipar--will see if he takes it, as patient is   non compliant with medical therapy  sw needs to start working on his outpatient unit-reportedly transferring [facility] [place]  S/p tap with cx (-)-note pt had peritonitis in past and now off pd-given the segs of 238 I would consider starting antibiotics (note improved from > 800)       Subjective:   CHIEF COMPLAINT:  None specific    Past Medical History:   Diagnosis Date   ??? Ascites    ??? Asthma    ??? C. difficile colitis    ??? CKD (chronic kidney disease) stage V requiring chronic dialysis (HCC)    ??? HTN (hypertension)    ??? SBP (spontaneous bacterial peritonitis) (HCC)       Past Surgical History:   Procedure Laterality Date   ??? HX HERNIA REPAIR       Social History   Substance Use Topics   ??? Smoking status: Heavy Tobacco Smoker     Packs/day: 0.50     Years: 10.00   ??? Smokeless tobacco: Never Used   ??? Alcohol use No      History reviewed. No pertinent family history.   Allergies   Allergen Reactions   ??? Lisinopril Shortness of Breath   ??? Morphine Shortness of Breath   ??? Toradol [Ketorolac] Shortness of Breath      Prior to Admission medications    Medication Sig Start Date End Date Taking? Authorizing Provider   ondansetron (ZOFRAN ODT) 8 mg disintegrating tablet Take 1 Tab by mouth every eight (8) hours as needed for Nausea. 01/15/16   David Personsara Marks, MD    albuterol (PROVENTIL HFA, VENTOLIN HFA, PROAIR HFA) 90 mcg/actuation inhaler Take 2 Puffs by inhalation every six (6) hours as needed for Wheezing. 12/19/15   David BeckwithWilliam Azie, MD   methylPREDNISolone (MEDROL, PAK,) 4 mg tablet Use as directed 12/19/15   David BeckwithWilliam Azie, MD   sevelamer (RENAGEL) 400 mg tablet Take 800 mg by mouth three (3) times daily (with meals).    Phys Other, MD   carvedilol (COREG) 12.5 mg tablet Take  by mouth two (2) times daily (with meals).    Phys Other, MD   hydrALAZINE (APRESOLINE) 100 mg tablet Take 100 mg by mouth three (3) times daily.    Phys Other, MD   amLODIPine (NORVASC) 10 mg tablet Take 10 mg by mouth daily.    Phys Other, MD   pantoprazole (PROTONIX) 40 mg tablet Take 40 mg by mouth daily.    Phys Other, MD   furosemide (LASIX) 80 mg tablet Take 80 mg by mouth daily.    Phys Other, MD   albuterol (PROVENTIL HFA, VENTOLIN HFA, PROAIR HFA) 90 mcg/actuation inhaler Take 2 Puffs by inhalation every four (4) hours as needed  for Wheezing.    Phys Other, MD     REVIEW OF SYSTEMS:     (+) hand pain/swelling    Objective:   VITALS:    Visit Vitals   ??? BP 133/79 (BP 1 Location: Right arm, BP Patient Position: Sitting)   ??? Pulse 89   ??? Temp 98.6 ??F (37 ??C)   ??? Resp 18   ??? Ht 6\' 1"  (1.854 m)   ??? Wt 111.5 kg (245 lb 13 oz)   ??? SpO2 93%   ??? BMI 32.43 kg/m2     PHYSICAL EXAM:  Gen:  [x]   WD [x]   WN  []  cachectic []   thin []   obese []   disheveled             []   ill apearing  []    Critical  []    Chronic    [x]   No acute distress    HEENT:   [x]  NC/AT/PERRLA/EOMI    []  pink conjunctivae      []  pale conjunctivae                  PERRL  []  yes  []  no      []  moist mucosa    []  dry mucosa    hearing intact to voice [x]  yes  []  No                 NECK:   supple [x]  yes  []  no        masses []  yes  []  No               thyroid  []   non tender  []   tender    RESP:   [x]  CTA bilaterally/no wheezing/rhonchi/rales/crackles    []  rhonchi bilaterally - no dullness  []  wheezing   []  rhonchi   []  crackles      use of accessory muscles []  yes [x]  no    CARD:   [x]   regular rate and rhythm/No murmurs/rubs/gallops    murmur  []  yes ()  []  no      Rubs  []  yes  []  no       Gallops []  yes  []  no    Rate []   regular  []   irregular        carotid bruits  []  Right  []   Left                 LE edema []  yes  [x]  no              ABD:    []  soft/non distended/non tender/+bowel sounds/no HSM    []   Rigid    tenderness []  yes []  no   Liver enlargement  []   yes []   no                Spleen enlargement  []   yes []   no     distended []   yes []  no     bowel sound  []  hypoactive   []  hyperactive      SKIN:   Rashes []   yes   [x]   no    Ulcers []   yes   []   no               []  tight to palpitation    skin turgor []   good  []  poor  []  decreased  Cyanosis/clubbing []   yes []   no    NEUR:   [x]  cranial nerves II-XII grossly intact       []  Cranial nerves deficit                 []   facial droop    []   slurred speech   []  aphasic     []  Strength normal     []   weakness  []   LUE  []    RUE/ []   LLE  []    RLE    follows commands  [x]   yes []   no           PSYCH:   insight []  poor []  good   Alert and Oriented to  [x]  person  [x]  place  [x]   time                    []  depressed []  anxious []  agitated  []  lethargic []  stuporous  []  sedated     LAB DATA REVIEWED:    Recent Labs      03/02/16   0550  03/01/16   0256   WBC  6.3  6.7   HGB  8.0*  8.2*   HCT  25.5*  24.6*   PLT  116*  123*     Recent Labs      03/02/16   0550  02/29/16   0621   NA  140  140   K  5.5*  6.0*   CL  104  104   CO2  23  23   BUN  43*  57*   CREA  8.86*  11.20*   GLU  78  74   CA  6.6*   6.6*  6.6*   MG  1.8   --    PHOS  5.1*   --      Recent Labs      02/29/16   0621   SGOT  19   ALT  14   AP  221*   TBILI  0.4   ALB  2.4*   GLOB  3.7   LPSE  486*     No results for input(s): INR, PTP, APTT in the last 72 hours.    No lab exists for component: INREXT, INREXT   No results for input(s): FE, TIBC, PSAT, FERR in the last 72 hours.    No results for input(s): PH, PCO2, PO2 in the last 72 hours.  Recent Labs      02/29/16   0621   CKMB  36.7*     Lab Results   Component Value Date/Time    Glucose (POC) 105 02/29/2016 11:05 AM       Procedures: see electronic medical records for all procedures/Xrays and details which were not copied into this note but were reviewed prior to creation of Plan.    ________________________________________________________________________       ___________________________________________________  Consulting Physician: Gary Fleetrudy L Rella Egelston, MD

## 2016-03-02 NOTE — Progress Notes (Signed)
Verbal report given to Marchelle FolksAmanda, oncoming med-tele nurse.

## 2016-03-02 NOTE — Progress Notes (Signed)
Pharmacy Automatic Renal Dosing Protocol - Antimicrobials    Indication for Antimicrobials: Intra-abdominal Infection    Current Regimen of Each Antimicrobial:  Zosyn 3.375g IV q12h (Start Date 11/14; Day # 1)  Fidaxomicin 200mg  PO BID (start 11/14 Day 1)  Vancomycin 3000 mg x1 then, 750 mg prn dialysis (start 11/14 Day 1)    Previous Antimicrobial Therapy:  Vancomycin PO   Metronidazole IV    Significant Cultures:   11/12 Blood NG x 2 days pending  11/12 Stool neg FINAL  11/13 Ascitic Fluid pending    Paralysis, amputations, malnutrition: none    Labs:  Recent Labs      03/02/16   0550  03/01/16   0256  02/29/16   0621   CREA  8.86*   --   11.20*   BUN  43*   --   57*   WBC  6.3  6.7  5.4     Temp (24hrs), Avg:98.4 ??F (36.9 ??C), Min:97.8 ??F (36.6 ??C), Max:98.6 ??F (37 ??C)      Creatinine Clearance (mL/min) or Dialysis: HD  No results found for: PCT    Impression/Plan:   Zosyn adjusted to 3.375g IV q12h extended dosing interval for HD patient per hospital protocol. Vancomycin 3000 mg x 1 then 750 mg after each HD, anticipated trough 15-20 mcg/ml.      Pharmacy will follow daily and adjust medications as appropriate for renal function and/or serum levels.    Thank you,  Delma OfficerMarshall A Pierce, [facility] CenterRPH          Recommended duration of therapy  [email or web address]    Renal Dosing  [email or web address]

## 2016-03-02 NOTE — Progress Notes (Signed)
GI Consultation Note David [name](David [name] for David [name])  NAME: David [name] DOB: Mar 24, 1971 MRN: [id number]   PCP: None  Date/Time:  03/02/2016 1:58 PM  Subjective:   REASON FOR CONSULT:    Recurrent C diff    David [name] is a 45 y.o.  male who I was asked to see for above. Pt reports since 1/17 he has had 10 episodes of C diff. These episodes are reportedly [facility] at [facility] [place]. He reports his last episode was in October where he was treated with Flagyl and PO Vancomycin. He reports he did well for a few days but then started having recurrent sx about 5 days ago. +5-6 loose BM's/day. +Associated lower AP. He also has had abdominal ascites for which he reports prior w/u negative for liver etiology.      Past Medical History:   Diagnosis Date   ??? Ascites    ??? Asthma    ??? C. difficile colitis    ??? CKD (chronic kidney disease) stage V requiring chronic dialysis (HCC)    ??? HTN (hypertension)    ??? SBP (spontaneous bacterial peritonitis) (HCC)       Past Surgical History:   Procedure Laterality Date   ??? HX HERNIA REPAIR       Social History   Substance Use Topics   ??? Smoking status: Heavy Tobacco Smoker     Packs/day: 0.50     Years: 10.00   ??? Smokeless tobacco: Never Used   ??? Alcohol use No      History reviewed. No pertinent family history.   Allergies   Allergen Reactions   ??? Lisinopril Shortness of Breath   ??? Morphine Shortness of Breath   ??? Toradol [Ketorolac] Shortness of Breath      Home Medications:  Prior to Admission Medications   Prescriptions Last Dose Informant Patient Reported? Taking?   albuterol (PROVENTIL HFA, VENTOLIN HFA, PROAIR HFA) 90 mcg/actuation inhaler   Yes No   Sig: Take 2 Puffs by inhalation every four (4) hours as needed for Wheezing.   albuterol (PROVENTIL HFA, VENTOLIN HFA, PROAIR HFA) 90 mcg/actuation inhaler   No No   Sig: Take 2 Puffs by inhalation every six (6) hours as needed for Wheezing.   amLODIPine (NORVASC) 10 mg tablet   Yes No   Sig: Take 10 mg by mouth daily.    carvedilol (COREG) 12.5 mg tablet   Yes No   Sig: Take  by mouth two (2) times daily (with meals).   furosemide (LASIX) 80 mg tablet   Yes No   Sig: Take 80 mg by mouth daily.   hydrALAZINE (APRESOLINE) 100 mg tablet   Yes No   Sig: Take 100 mg by mouth three (3) times daily.   methylPREDNISolone (MEDROL, PAK,) 4 mg tablet   No No   Sig: Use as directed   ondansetron (ZOFRAN ODT) 8 mg disintegrating tablet   No No   Sig: Take 1 Tab by mouth every eight (8) hours as needed for Nausea.   pantoprazole (PROTONIX) 40 mg tablet   Yes No   Sig: Take 40 mg by mouth daily.   sevelamer (RENAGEL) 400 mg tablet   Yes No   Sig: Take 800 mg by mouth three (3) times daily (with meals).      Facility-Administered Medications: None     Hospital medications:  Current Facility-Administered Medications   Medication Dose Route Frequency   ??? vancomycin 50 mg/mL oral solution (compounded) 250 mg  250 mg Oral Q6H   ??? albumin human 25% (BUMINATE) solution 25 g  25 g IntraVENous ONCE   ??? metroNIDAZOLE (FLAGYL) IVPB premix 500 mg  500 mg IntraVENous Q8H   ??? [START ON 03/03/2016] cinacalcet (SENSIPAR) tablet 30 mg  30 mg Oral DAILY WITH BREAKFAST   ??? [START ON 03/03/2016] doxercalciferol (HECTOROL) 4 mcg/2 mL injection 2 mcg  2 mcg IntraVENous DIALYSIS MON, WED & FRI   ??? patiromer calcium sorbitex (VELTASSA) powder 8.4 g  8.4 g Oral DAILY   ??? albuterol (PROVENTIL HFA, VENTOLIN HFA, PROAIR HFA) inhaler 2 Puff  2 Puff Inhalation Q6H PRN   ??? cloNIDine HCl (CATAPRES) tablet 0.1 mg  0.1 mg Oral Q12H   ??? sodium chloride (NS) flush 5-10 mL  5-10 mL IntraVENous PRN   ??? amLODIPine (NORVASC) tablet 10 mg  10 mg Oral DAILY   ??? carvedilol (COREG) tablet 12.5 mg  12.5 mg Oral BID WITH MEALS   ??? sevelamer (RENAGEL) tablet 800 mg  800 mg Oral TID WITH MEALS   ??? sodium chloride (NS) flush 5-10 mL  5-10 mL IntraVENous Q8H   ??? ondansetron (ZOFRAN) injection 4 mg  4 mg IntraVENous Q4H PRN    ??? HYDROmorphone (PF) (DILAUDID) injection 2 mg  2 mg IntraVENous Q3H PRN     REVIEW OF SYSTEMS:     []      Unable to obtain  ROS due to  []     mental status change  []     sedated   []     intubated   [x]     Total of 11 systems reviewed as follows:  Const:  negative fever, negative chills, negative weight loss  Eyes:   negative diplopia or visual changes, negative eye pain  ENT:   negative coryza, negative sore throat  Resp:   negative cough, hemoptysis, dyspnea  Cards:  negative for chest pain, palpitations, lower extremity edema  GU:  negative for frequency, dysuria and hematuria  Skin:   negative for rash and pruritus  Heme:  negative for easy bruising and gum/nose bleeding  MS:  negative for myalgias, arthralgias, back pain and muscle weakness  Neurolo:  negative for headaches, dizziness, vertigo, memory problems   Psych:  negative for feelings of anxiety, depression     Pertinent Positives include :    Objective:   VITALS:    Visit Vitals   ??? BP 133/79 (BP 1 Location: Right arm, BP Patient Position: Sitting)   ??? Pulse 89   ??? Temp 98.6 ??F (37 ??C)   ??? Resp 18   ??? Ht 6\' 1"  (1.854 m)   ??? Wt 111.5 kg (245 lb 13 oz)   ??? SpO2 93%   ??? BMI 32.43 kg/m2     Temp (24hrs), Avg:98.3 ??F (36.8 ??C), Min:97.8 ??F (36.6 ??C), Max:98.6 ??F (37 ??C)    PHYSICAL EXAM:   General:    Alert, cooperative, no distress, appears stated age.     Head:   Normocephalic, without obvious abnormality, atraumatic.  Eyes:   Conjunctivae clear, anicteric sclerae.  Pupils are equal  Nose:  Nares normal. No drainage or sinus tenderness.  Throat:    Lips, mucosa, and tongue normal.  No Thrush  Neck:  Supple, symmetrical,  no adenopathy, thyroid: non tender  Back:    Symmetric,  No CVA tenderness.  Lungs:   CTA bilaterally.  No wheezing/rhonchi/rales.  Chest wall:  No tenderness or deformity. No Accessory muscle use.  Heart:  Regular rate and rhythm,  no murmur, rub or gallop.  Abdomen:   Soft, +diffuse TTP w/o peritoneal signs, +fluid wave Bowel  sounds normal. No masses  Extremities: Atraumatic, No cyanosis.  No edema. No clubbing  Skin:     Texture, turgor normal. No rashes/lesions/jaundice  Lymph: Cervical, supraclavicular normal.  Psych:  Good insight.  Not depressed.  Not anxious or agitated.  Neurologic: EOMs intact. No facial asymmetry. No aphasia or slurred speech. Normal  strength, A/O X 3.     LAB DATA REVIEWED:    Recent Results (from the past 48 hour(s))   CBC WITH AUTOMATED DIFF    Collection Time: 03/01/16  2:56 AM   Result Value Ref Range    WBC 6.7 4.1 - 11.1 K/uL    RBC 2.69 (L) 4.10 - 5.70 M/uL    HGB 8.2 (L) 12.1 - 17.0 g/dL    HCT 91.4 (L) 78.2 - 50.3 %    MCV 91.4 80.0 - 99.0 FL    MCH 30.5 26.0 - 34.0 PG    MCHC 33.3 30.0 - 36.5 g/dL    RDW 95.6 (H) 21.3 - 14.5 %    PLATELET 123 (L) 150 - 400 K/uL    NEUTROPHILS 78 (H) 32 - 75 %    LYMPHOCYTES 10 (L) 12 - 49 %    MONOCYTES 7 5 - 13 %    EOSINOPHILS 5 0 - 7 %    BASOPHILS 0 0 - 1 %    ABS. NEUTROPHILS 5.2 1.8 - 8.0 K/UL    ABS. LYMPHOCYTES 0.7 (L) 0.8 - 3.5 K/UL    ABS. MONOCYTES 0.5 0.0 - 1.0 K/UL    ABS. EOSINOPHILS 0.3 0.0 - 0.4 K/UL    ABS. BASOPHILS 0.0 0.0 - 0.1 K/UL    RBC COMMENTS ANISOCYTOSIS  1+        RBC COMMENTS TEARDROP CELLS  PRESENT        DF SMEAR SCANNED     CELL COUNT, BODY FLUID    Collection Time: 03/01/16 12:45 PM   Result Value Ref Range    BODY FLUID TYPE ASCITIC FLUID       FLUID COLOR RED      FLUID APPEARANCE CLOUDY      FLUID RBC CT. >100 /cu mm    FLUID WBC COUNT 238 (H) 0 - 5 /cu mm    FLD NEUTROPHILS 18 %    FLD LYMPHS 36 %    FLD MONO/MACROPHAGES 2 %    FLD EOSINS 9 %    FLUID MESOTHELIAL 35 %   PROTEIN TOTAL, FLUID    Collection Time: 03/01/16 12:45 PM   Result Value Ref Range    Fluid Type: ASCITIC FLUID       Protein total, body fld. 3.5 g/dL   ALBUMIN, FLUID    Collection Time: 03/01/16 12:45 PM   Result Value Ref Range    Fluid Type: ASCITIC FLUID       Albumin, body fld. 1.6 g/dL   CULTURE, BODY FLUID Gay Filler STAIN     Collection Time: 03/01/16  1:00 PM   Result Value Ref Range    Special Requests: NO SPECIAL REQUESTS      GRAM STAIN OCCASIONAL WBCS SEEN      GRAM STAIN NO ORGANISMS SEEN      Culture result: NO GROWTH AFTER 16 HOURS     MAGNESIUM    Collection Time: 03/02/16  5:50 AM   Result Value  Ref Range    Magnesium 1.8 1.6 - 2.4 mg/dL   PHOSPHORUS    Collection Time: 03/02/16  5:50 AM   Result Value Ref Range    Phosphorus 5.1 (H) 2.6 - 4.7 MG/DL   CBC W/O DIFF    Collection Time: 03/02/16  5:50 AM   Result Value Ref Range    WBC 6.3 4.1 - 11.1 K/uL    RBC 2.76 (L) 4.10 - 5.70 M/uL    HGB 8.0 (L) 12.1 - 17.0 g/dL    HCT 16.125.5 (L) 09.636.6 - 50.3 %    MCV 92.4 80.0 - 99.0 FL    MCH 29.0 26.0 - 34.0 PG    MCHC 31.4 30.0 - 36.5 g/dL    RDW 04.518.4 (H) 40.911.5 - 14.5 %    PLATELET 116 (L) 150 - 400 K/uL   METABOLIC PANEL, BASIC    Collection Time: 03/02/16  5:50 AM   Result Value Ref Range    Sodium 140 136 - 145 mmol/L    Potassium 5.5 (H) 3.5 - 5.1 mmol/L    Chloride 104 97 - 108 mmol/L    CO2 23 21 - 32 mmol/L    Anion gap 13 5 - 15 mmol/L    Glucose 78 65 - 100 mg/dL    BUN 43 (H) 6 - 20 MG/DL    Creatinine 8.118.86 (H) 0.70 - 1.30 MG/DL    BUN/Creatinine ratio 5 (L) 12 - 20      GFR est AA 8 (L) >60 ml/min/1.8773m2    GFR est non-AA 7 (L) >60 ml/min/1.8873m2    Calcium 6.6 (L) 8.5 - 10.1 MG/DL   PTH INTACT    Collection Time: 03/02/16  5:50 AM   Result Value Ref Range    Calcium 6.6 (L) 8.5 - 10.1 MG/DL    PTH, Intact >9147.8>1900.0 (H) 14.0 - 72.0 pg/mL     IMAGING RESULTS:   []       I have personally reviewed the actual   []     CXR  []     CT  []      US    Assessment/Plan:      Active Problems:    Diarrhea (02/29/2016)    1. Recurrent C diff (pt reports 10 episodes since 1/17; though all prior episodes were at [facility] in [place] and records are not currently available)  2. Abdominal ascites which he reports has been worked p in past  3. 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 In this case would consider Nephrogenic cause. Next time patient has a paracentesis would recommend sending for cytology. Given his ascites is not 2/2 portal hypertension would defer further w/u and management to Hospitalist/Nephrologist    ___________________________________________________  Care Plan discussed with:    [x]     Patient   []     Family   []     Nursing   []     Attending     ___________________________________________________  GI: Lamar SprinklesJonathan G Anastaisa Wooding, MD

## 2016-03-02 NOTE — Consults (Signed)
Pt seen & examined . Chart reviewed   Dictated Consult to follow .   Pt  is a 45 yr old male with ESRD on HD  , h/o pneumonia in December 2016. Since then he has had recurrent C.diff colitis. Pt states that this is episode  10. Previous episodes at [name]. He has been treated with Vancomycin po, Flagyl , Fidaxomycin in the past. Continues to have recurrences. ? Compliance with therapy   Pt also has developed ascites & gets paracentesis done on a regular basis.     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 Fidaxomycin is preferred for initial recurrence.   Pt would benefit  From colonoscopy when stable.    Suggest Ceftriaxone & Vancomycin Iv after HD for SBP in view of  ongoing C.diff  colitis & toxicity caused by antibiotics   Pt would probably benefit from liver biopsy to determine etiology of  Ascites   Ascitic fluid for Cytology, Culture , AFB smear & culture , CEA, AFP,   HIV testing   Records from [name].    Thank You.     David Burandt S Mayling Aber, MD FACP

## 2016-03-02 NOTE — Progress Notes (Signed)
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 His LUE access is partially patent but not functional. L sided cath in place since removal of infected PD cath in July.  Needs LUE and central venogram and probable ligation of residual fistula and relocation of catheter. Will try to get arranged as other medical issues are worked out.

## 2016-03-02 NOTE — Consults (Signed)
See Progress note.

## 2016-03-02 NOTE — Progress Notes (Signed)
Pharmacy Automatic Renal Dosing Protocol - Antimicrobials    Indication for Antimicrobials: Intra-abdominal Infection    Current Regimen of Each Antimicrobial:  Zosyn 3.375g IV q12h (Start Date 11/14; Day # 1)  Fidaxomicin 200mg  PO BID (start 11/14 Day 1)    Previous Antimicrobial Therapy:  Vancomycin PO   Metronidazole IV    Significant Cultures:   11/12 Blood NG x 2 days pending  11/12 Stool neg FINAL  11/13 Ascitic Fluid pending    Paralysis, amputations, malnutrition: none    Labs:  Recent Labs      03/02/16   0550  03/01/16   0256  02/29/16   0621   CREA  8.86*   --   11.20*   BUN  43*   --   57*   WBC  6.3  6.7  5.4     Temp (24hrs), Avg:98.4 ??F (36.9 ??C), Min:97.8 ??F (36.6 ??C), Max:98.6 ??F (37 ??C)      Creatinine Clearance (mL/min) or Dialysis: HD  No results found for: PCT    Impression/Plan: Zosyn adjusted to 3.375g IV q12h extended dosing interval for HD patient per hospital protocol.      Pharmacy will follow daily and adjust medications as appropriate for renal function and/or serum levels.    Thank you,  Marissa S Djukanovic, PHARMD          Recommended duration of therapy  [email or web address]    Renal Dosing  [email or web address]

## 2016-03-02 NOTE — Progress Notes (Signed)
[facility].  Awaiting transport.

## 2016-03-02 NOTE — Other (Signed)
Bedside interdisciplinary rounds were held today to discuss patient plan of care and outcomes. The following members were present: Physician, Nurse, Clinical Care Leader, Pharmacy, Physical Therapy, and Case Management.    Plan:  Continue current treatment.  Await recommendations from consultants: GI & ID.

## 2016-03-02 NOTE — Progress Notes (Signed)
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 )  -Pt with abdominal pain, nausea, vomiting, and decreased PO intake on presentation   -c diff is positive today   - on enteric precautions  -Last abx use was last month and he supposedly received Flagyl and Vanco  -will consult GI and ID due to recurrent C diff  -d/w Gi and they recommended fidaxomycin and Fecal transplant at some point   -Antiemetics PRN  -Pain meds as needed  -Try Clear Liquid diet and see if pt tolerates it      ESRD on HD MWF for 7 years, missed 11/10 session  Hyperkalemia  LUE swelling  - no peaked T waves on EKG  -Given hyperkalemia cocktail by ED  -Monitor labs, repeat ordered   -AVF LUE is not working as per the pt, which is why he has a L chest catheter  -LUE [place] pending shows patent veins and being evaluated by Dr Manson Passey  -Nephrology following   -Had lengthy discussion in regards to compliance with treatment - pt agreeable currently but has underlying compliance issues it seems    Hypertension, uncontrolled  -Should improve with dialysis  -BP stable     Recurrent Ascites  -CT AP noted, no evidence of cirrhosis - pt denies EtOH use in the past 10 years   -States that he has been worked up for this in the past and it is not because of cirrhosis  -SAAG is less than  1.1 so protal HTN is unlikely and Lft's are normal   -Ascitic fluid wbc is more than 238 and renal recommends abx due to hx of peritonitis so will start vancomycin and zosyn. Consult pharmacy.  -Echo shows normal EF and no diastolic dysfunction. Check UA to look for urinary protein.      Body mass index is 32.43 kg/(m^2).    Code status: Full  Prophylaxis: SCD  Recommended Disposition: Home w/Family     Subjective:     Chief Complaint / Reason for Physician Visit  Reports still having loose stools. No abdominal pain. C diff is positive. No nausea or vomiting.     Review of Systems:  Symptom Y/N Comments  Symptom Y/N Comments   Fever/Chills n   Chest Pain n    Poor Appetite n   Edema     Cough    Abdominal Pain y    Sputum    Joint Pain     SOB/DOE n   Pruritis/Rash     Nausea/vomit n   Tolerating PT/OT y    Diarrhea y   Tolerating Diet y    Constipation n   Other       Could NOT obtain due to:      Objective:     VITALS:   Last 24hrs VS reviewed since prior progress note. Most recent are:  Patient Vitals for the past 24 hrs:   Temp Pulse Resp BP SpO2   03/02/16 1405 98.6 ??F (37 ??C) 95 16 152/88 97 %   03/02/16 1035 98.6 ??F (37 ??C) 89 18 133/79 93 %   03/02/16 0613 - 95 - 131/53 -   03/02/16 0200 98.2 ??F (36.8 ??C) 98 18 129/41 98 %   03/01/16 2200 97.8 ??F (36.6 ??C) 97 16 135/85 98 %   03/01/16 2121 - - - (!)  174/95 -   03/01/16 1807 98.6 ??F (37 ??C) 100 18 (!) 179/99 95 %     No intake or output data in the 24 hours ending 03/02/16 1639     PHYSICAL EXAM:  General: WD, WN. Alert, cooperative, no acute distress????  EENT:  EOMI. Anicteric sclerae. MMM  Resp:  Crackles bibasilar  CV:  Regular  rhythm,?? No edema  GI:  Soft, non tender, distended, BS+  Neurologic:?? Alert and oriented X 3, normal speech, no focal deficits  Psych:???? Fair insight.??Not anxious nor agitated  Skin:  No rashes.  No jaundice.  MSK:  LUE swelling, radial pulse palpated, warm, good cap refill less than 2 seconds    Reviewed most current lab test results and cultures  YES  Reviewed most current radiology test results   YES  Review and summation of old records today    NO  Reviewed patient's current orders and MAR    YES  PMH/SH reviewed - no change compared to H&P      Current Facility-Administered Medications:   ???  albumin human 25% (BUMINATE) solution 25 g, 25 g, IntraVENous, ONCE, Vena AustriaVarun K Judah Chevere, MD  ???  [START ON 03/03/2016] cinacalcet (SENSIPAR) tablet 30 mg, 30 mg, Oral, DAILY WITH BREAKFAST, Gary Fleetrudy L Rickman, MD  ???  [START ON 03/03/2016] doxercalciferol (HECTOROL) 4 mcg/2 mL injection 2  mcg, 2 mcg, IntraVENous, DIALYSIS MON, WED & FRI, Gary Fleetrudy L Rickman, MD  ???  fidaxomicin (DIFICID) tablet 200 mg, 200 mg, Oral, BID, Lamar SprinklesJonathan G Gaspar, MD  ???  patiromer calcium sorbitex (VELTASSA) powder 8.4 g, 8.4 g, Oral, DAILY, Gary Fleetrudy L Rickman, MD  ???  albuterol (PROVENTIL HFA, VENTOLIN HFA, PROAIR HFA) inhaler 2 Puff, 2 Puff, Inhalation, Q6H PRN, Virl DiamondJoanne E Lapetina, MD, 2 Puff at 03/01/16 1817  ???  cloNIDine HCl (CATAPRES) tablet 0.1 mg, 0.1 mg, Oral, Q12H, Gary Fleetrudy L Rickman, MD, 0.1 mg at 03/02/16 0900  ???  sodium chloride (NS) flush 5-10 mL, 5-10 mL, IntraVENous, PRN, Edward JollyFloyd D. Providence LaniusHowell, MD, 10 mL at 03/02/16 1512  ???  amLODIPine (NORVASC) tablet 10 mg, 10 mg, Oral, DAILY, Earvin HansenSamip S Dave, MD, 10 mg at 03/02/16 0901  ???  carvedilol (COREG) tablet 12.5 mg, 12.5 mg, Oral, BID WITH MEALS, Earvin HansenSamip S Dave, MD, 12.5 mg at 03/02/16 0901  ???  sevelamer (RENAGEL) tablet 800 mg, 800 mg, Oral, TID WITH MEALS, Earvin HansenSamip S Dave, MD, 800 mg at 03/02/16 1131  ???  sodium chloride (NS) flush 5-10 mL, 5-10 mL, IntraVENous, Q8H, Earvin HansenSamip S Dave, MD, 10 mL at 03/02/16 0559  ???  ondansetron (ZOFRAN) injection 4 mg, 4 mg, IntraVENous, Q4H PRN, Earvin HansenSamip S Dave, MD, 4 mg at 03/01/16 0258  ???  HYDROmorphone (PF) (DILAUDID) injection 2 mg, 2 mg, IntraVENous, Q3H PRN, Virl DiamondJoanne E Lapetina, MD, 2 mg at 03/02/16 1512    ________________________________________________________________________  Care Plan discussed with:    Comments   Patient y    Family      RN y    Care Manager     Consultant  y Dr Orlean PattenGaspar                     Multidiciplinary team rounds were held today with case manager, nursing, pharmacist and clinical coordinator.  Patient's plan of care was discussed; medications were reviewed and discharge planning was addressed.     ________________________________________________________________________  Total NON critical care TIME:  35   Minutes    Total CRITICAL CARE  TIME Spent:   Minutes non procedure based      Comments    >50% of visit spent in counseling and coordination of care y    ________________________________________________________________________  Vena AustriaVarun K Montario Zilka, MD     Procedures: see electronic medical records for all procedures/Xrays and details which were not copied into this note but were reviewed prior to creation of Plan.      LABS:  I reviewed today's most current labs and imaging studies.  Pertinent labs include:  Recent Labs      03/02/16   0550  03/01/16   0256  02/29/16   0621   WBC  6.3  6.7  5.4   HGB  8.0*  8.2*  8.3*   HCT  25.5*  24.6*  25.7*   PLT  116*  123*  119*     Recent Labs      03/02/16   0550  02/29/16   0621   NA  140  140   K  5.5*  6.0*   CL  104  104   CO2  23  23   GLU  78  74   BUN  43*  57*   CREA  8.86*  11.20*   CA  6.6*   6.6*  6.6*   MG  1.8   --    PHOS  5.1*   --    ALB   --   2.4*   TBILI   --   0.4   SGOT   --   19   ALT   --   14       Signed: Vena AustriaVarun K Weslyn Holsonback, MD

## 2016-03-02 NOTE — Progress Notes (Signed)
Unable to get urine specimen due to patient ESRD and does not void.

## 2016-03-02 NOTE — Progress Notes (Signed)
Pharmacy ??? Clostridium Difficile MUE    Pharmacy review of clostridium difficile therapy for this 45 y.o. male.    Current Antibiotics:   Vancomycin 250mg  PO q6h; start 11/14; Day #1  Metronidazole 500mg  IV q6h; start 11/14; Day #1      Recent Labs      03/02/16   0550  03/01/16   0256  02/29/16   0621   CREA  8.86*   --   11.20*   BUN  43*   --   57*   WBC  6.3  6.7  5.4     Temp (24hrs), Avg:98.4 ??F (36.9 ??C), Min:97.8 ??F (36.6 ??C), Max:98.6 ??F (37 ??C)    Diagnostic Test (PCR) Results:   Recent Labs      02/29/16   1128   CDDNAT  POSITIVE*       Severity: Mild: WBC < 15,000 and serum creatinine less than 1.5 baseline level    Impression/Plan:   - Change Metronidazole to 500mg  IV q8h per protocol  - Vancomycin 250mg  PO q6h     Thank you,  Redgie GrayerJeannie M Lavery-Tostenson, RPH       See the Clostridium difficile page on the web site for detailed information.               Patients usually show symptomatic improvement within 1-2 days after initiation of therapy, with a mean time to diarrhea resolution of 3-6 days. If the patient's condition does not improve during the first 2 days of treatment or worsens at any point during therapy an escalation of therapy should be considered.     IDSA Recommendations  Monitor wbc, temperature, and renal function.  robiotics are not recommended.  Discontinue inciting antimicrobials as soon as possible.  Avoid use of antiperistaltic agents (diphenoxalate, loperamide, opioids) as they increase the risk of toxic megacolon and obscure symptoms.  Avoid use of binding agents (cholestyramine, colestipol, and other anion-exchange resins) as they bind vancomycin.  Avoid use of PPIs and H2 antagonist (not IDSA recommendation but current literature recommendation)  Do not use metronidazole beyond the first recurrence or for long-term therapy because of potential for cumulative neurotoxicity.  *Vancomycin administered orally and per rectum (500 mg per 100 ml NS four  times a day as retention enema) if ileus is present.  Pulse/Tapering vancomycin for second recurrence: 125 mg QID x 10-14 days, vancomycin 125 twice daily for 7 days, 125 mg once daily for 7 days, and then 125 mg every 2-3 days for 2-8 weeks.

## 2016-03-02 NOTE — Consults (Signed)
Pt seen & examined . Chart reviewed   Dictated Consult to follow .   Pt  is a 45 yr old male with ESRD on HD  , h/o pneumonia in December 2016. Since then he has had recurrent C.diff colitis. Pt states that this is episode  10. [facility]. He has been treated with Vancomycin po, Flagyl , Fidaxomycin in the past. Continues to have recurrences. ? Compliance with therapy   Pt also has developed ascites & gets paracentesis done on a regular basis.     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 Fidaxomycin is preferred for initial recurrence.   Pt would benefit  From colonoscopy when stable.    Suggest Ceftriaxone & Vancomycin Iv after HD for SBP in view of  ongoing C.diff  colitis & toxicity caused by antibiotics   Pt would probably benefit from liver biopsy to determine etiology of  Ascites   Ascitic fluid for Cytology, Culture , AFB smear & culture , CEA, AFP,   HIV testing   [facility].    Thank You.     Richardean Chimeraiane S Saud Bail, MD Jerrel IvoryFACP

## 2016-03-02 NOTE — Consults (Signed)
[facility]   [address]   [place], [place] [id number]   CONSULTATION       Name:  David LindenMORGAN, David D   MR#:  [id number]   DOB:  Feb 12, 1971   Account #:  [id number]    Date of Consultation:  03/02/2016   Date of Adm:  02/29/2016       REQUESTING PHYSICIAN: David AustriaVarun K. Golla, MD    REASON FOR CONSULTATION: Recurrent C. Difficile colitis.    HISTORY OF PRESENT ILLNESS: Mr David [name]  is a 45 year old male   with a past medical history significant for End Stage Renal Disease on   Hemodialysis for past 7 years, history of Ascites . He also has a h/o Recurrent  C. Diff Colitis .  Patient was admitted secondary to nausea, vomiting and diarrhea.  C. difficile colitis has been present since February of   this year. Pt states he was treated for  Pneumonia in December 2016.   He was placed on antibiotics. Thereafter he developed diarrhea. He was diagnosed with  C. Difficile Colitis. He has been treated with rounds   of Vancomycin, Flagyl and Fidaxomicin.     He developed pneumonia again in September  & was treated with antibiotics .  He had another recurrence of C.diff    was [facility] .   He finished a course of C. difficile treatment and then   he started to develop symptoms about 3 days ago.. Pt has been having     drainage of ascites done on a regular basis. He has  had multiple   Tests done for ascites including  [facility]. He was   told that he did not have cirrhosis. The patient continues to require   paracentesis. Pt denies alcohol use.     On ROS The patient says he has about 5 bowel movements a day. He also has some abdominal   pain with bowel movements. Currently, no nausea or vomiting. No fever, chills     PAST MEDICAL HISTORY:  History of ESRD   Hemodialysis Monday, Wednesday, Friday. Hypertension,   Ascites, Recurrent C. difficile colitis,Asthma.    PAST SURGICAL HISTORY: Hernia repair.    ALLERGIES    1. LISINOPRIL.    2. MORPHINE.   3. TORADOL.     SOCIAL HISTORY: The patient smokes half pack per day. He has   been smoking over 10 years. Denies alcohol use. Denies recreational   drug use.    FAMILY HISTORY: Father has hypertension. Mother has   hypertension. No history of liver disease.    MEDICATIONS PRIOR TO ADMISSION    1. Zofran 8 mg p.o. as needed.   2. Albuterol ProAir 90 mcg 2 puffs every 6 hours as needed.   3. Medrol Pak 4 mg as needed.   4. Renagel 800 mg 3 times daily.   5. Coreg 12.5 mg b.i.d.   6. Hydralazine 100 mg 3 times a day.   7. Amlodipine 10 mg by mouth daily.   8. Protonix 40 mg daily.   9. Furosemide 80 daily.     PHYSICAL EXAMINATION   GENERAL: This is a middle-aged male sitting up in bed, presently in   no acute distress.   VITAL SIGNS: Pulse 91, blood pressure 145/83, respirations 12,   temperature 98.1, T-max 98.6.   NECK: Normocephalic, atraumatic. Pupils equal, round, and reactive.   Oral: No erythema of  pharynx.   LUNGS: Reduced air entry in bases bilaterally.   HEART: First and second heart sounds heard. No murmurs.   ABDOMEN: Distended, some tenderness on palpation. Bowel   sounds present in all 4 quadrants.   EXTREMITIES: No clubbing, cyanosis, or edem. Peripheral pulses   felt bilaterally and equally.    PERTINENT LABORATORY DATA: WBC 6.3, neutrophils 28,   lymphocytes 10, hemoglobin 8, hematocrit 25.5, platelets 116. BUN   43, creatinine 8.86. Paracentesis fluid culture, no organisms seen, no   growth after 16 hours. Albumin was 1.6, total protein 3.5. Cell count:   WBC 238, neutrophils 18%. C. difficile DNA was positive.     CT of his abdomen, no acute intra-abdominal findings, moderate   ascites.    CLINICAL IMPRESSION: This  Is  patient with End Sttage Renal disease on   Hemodialysis, Ascites, requiring paracentesis on a regular basis,   etiology unknown, with recurrent C. difficile colitis. The patient says this   his 10th episode. He has been on Vancomycin p.o., Flagyl, Fidaxomicin,    with improvement on medication, recurrence of symptoms on completion of therapy..     In case of C.Difficile colitis   with multiple recurrences.   the recommended treatment is FMT (fecal microbiota   transplant). While waiting for FMT, recommendation is Vancomycin   pulse dose taper with probiotics. This is preferred over Fidaxomicin in this case.   Fidaxomicin would be preferred for for initial recurrence.  The patient would  benefit from Colonoscopy at some point when he is stable & free of diarrhea.   I would suggest changing empiric antibiotics for coverage of peritonitis with   Ceftriaxone IV and Vancomycin to be given after hemodialysis.   Patient would probably benefit from Liver biopsy to determine etiology   of the ascites. Details of his previous biopsy many years ago are   unknown. I would also recommend ascitic fluid being sent for   Cytology, AFB smear and culture, CEA and AFP;Also  HIV   Testing. Hepatitis B and Ct tests are  negative .    I have  requested [facility]   obt[facility].     Recommendations were discussed at length with Dr. Darlina RumpfGolla, Hospitalist.        Shelbie Franken S. Driscilla MoatsSINNATAMBY, MD FACP     DS / THS   D:  03/02/2016   23:35   T:  03/03/2016   06:44   Job #:  [id number]

## 2016-03-03 ENCOUNTER — Inpatient Hospital Stay: Admit: 2016-03-03 | Payer: MEDICARE

## 2016-03-03 ENCOUNTER — Ambulatory Visit: Admit: 2016-03-03 | Payer: MEDICARE

## 2016-03-03 ENCOUNTER — Inpatient Hospital Stay

## 2016-03-03 LAB — OVA & PARASITES, STOOL

## 2016-03-03 LAB — OVA & PARASITES, STOOL, REFLEX

## 2016-03-03 MED ORDER — SODIUM CHLORIDE 0.9 % IV
INTRAVENOUS | Status: DC
Start: 2016-03-03 — End: 2016-03-03
  Administered 2016-03-03: 20:00:00 via INTRAVENOUS

## 2016-03-03 MED ORDER — ALBUTEROL SULFATE 0.083 % (0.83 MG/ML) SOLN FOR INHALATION
2.5 mg /3 mL (0.083 %) | RESPIRATORY_TRACT | Status: DC | PRN
Start: 2016-03-03 — End: 2016-03-10

## 2016-03-03 MED ORDER — EPOETIN ALFA 10,000 UNIT/ML IJ SOLN
10000 unit/mL | INTRAMUSCULAR | Status: DC
Start: 2016-03-03 — End: 2016-03-10

## 2016-03-03 MED ORDER — VANCOMYCIN 10 GRAM IV SOLR
10 gram | Freq: Once | INTRAVENOUS | Status: AC
Start: 2016-03-03 — End: 2016-03-03
  Administered 2016-03-03: 04:00:00 via INTRAVENOUS

## 2016-03-03 MED ORDER — PEG 3350-ELECTROLYTES ORAL SOLUTION
Freq: Once | ORAL | Status: AC
Start: 2016-03-03 — End: 2016-03-03
  Administered 2016-03-03: 22:00:00 via ORAL

## 2016-03-03 MED ORDER — FENTANYL CITRATE (PF) 50 MCG/ML IJ SOLN
50 mcg/mL | INTRAMUSCULAR | Status: DC | PRN
Start: 2016-03-03 — End: 2016-03-03
  Administered 2016-03-03 (×3): via INTRAVENOUS

## 2016-03-03 MED ORDER — IOPAMIDOL 76 % IV SOLN
370 mg iodine /mL (76 %) | Freq: Once | INTRAVENOUS | Status: AC
Start: 2016-03-03 — End: 2016-03-03
  Administered 2016-03-03: 21:00:00 via INTRAVENOUS

## 2016-03-03 MED ORDER — HEPARIN (PORCINE) IN NS (PF) 1,000 UNIT/500 ML IV
1000 unit/500 mL | Freq: Once | INTRAVENOUS | Status: AC
Start: 2016-03-03 — End: 2016-03-03
  Administered 2016-03-03: 21:00:00

## 2016-03-03 MED ORDER — ALBUTEROL SULFATE 0.083 % (0.83 MG/ML) SOLN FOR INHALATION
2.5 mg /3 mL (0.083 %) | RESPIRATORY_TRACT | Status: AC
Start: 2016-03-03 — End: 2016-03-03
  Administered 2016-03-03: 08:00:00

## 2016-03-03 MED ORDER — MIDAZOLAM 1 MG/ML IJ SOLN
1 mg/mL | INTRAMUSCULAR | Status: DC | PRN
Start: 2016-03-03 — End: 2016-03-03

## 2016-03-03 MED ORDER — LIDOCAINE HCL 2 % (20 MG/ML) IJ SOLN
20 mg/mL (2 %) | Freq: Once | INTRAMUSCULAR | Status: AC
Start: 2016-03-03 — End: 2016-03-03
  Administered 2016-03-03: 21:00:00 via SUBCUTANEOUS

## 2016-03-03 MED FILL — VANCOMYCIN 10 GRAM IV SOLR: 10 gram | INTRAVENOUS | Qty: 3000

## 2016-03-03 MED FILL — ISOVUE-370  76 % INTRAVENOUS SOLUTION: 370 mg iodine /mL (76 %) | INTRAVENOUS | Qty: 100

## 2016-03-03 MED FILL — HYDROMORPHONE (PF) 1 MG/ML IJ SOLN: 1 mg/mL | INTRAMUSCULAR | Qty: 2

## 2016-03-03 MED FILL — VANCOMYCIN ORAL SOLUTION 50 MG/ML CPD (RX COMPOUNDED): 50 mg/mL | ORAL | Qty: 5

## 2016-03-03 MED FILL — GOLYTELY 236 GRAM-22.74 GRAM-6.74 GRAM-5.86 GRAM ORAL SOLUTION: ORAL | Qty: 4000

## 2016-03-03 MED FILL — BD POSIFLUSH NORMAL SALINE 0.9 % INJECTION SYRINGE: INTRAMUSCULAR | Qty: 10

## 2016-03-03 MED FILL — AMLODIPINE 5 MG TAB: 5 mg | ORAL | Qty: 2

## 2016-03-03 MED FILL — VELTASSA 8.4 GRAM ORAL POWDER PACKET: 8.4 gram | ORAL | Qty: 1

## 2016-03-03 MED FILL — CEFTRIAXONE 2 GRAM SOLUTION FOR INJECTION: 2 gram | INTRAMUSCULAR | Qty: 2

## 2016-03-03 MED FILL — CARVEDILOL 12.5 MG TAB: 12.5 mg | ORAL | Qty: 1

## 2016-03-03 MED FILL — HEPARIN (PORCINE) IN NS (PF) 1,000 UNIT/500 ML IV: 1000 unit/500 mL | INTRAVENOUS | Qty: 1000

## 2016-03-03 MED FILL — CLONIDINE 0.1 MG TAB: 0.1 mg | ORAL | Qty: 1

## 2016-03-03 MED FILL — FENTANYL CITRATE (PF) 50 MCG/ML IJ SOLN: 50 mcg/mL | INTRAMUSCULAR | Qty: 2

## 2016-03-03 MED FILL — PROCRIT 10,000 UNIT/ML INJECTION SOLUTION: 10000 unit/mL | INTRAMUSCULAR | Qty: 1

## 2016-03-03 MED FILL — ALBUTEROL SULFATE 0.083 % (0.83 MG/ML) SOLN FOR INHALATION: 2.5 mg /3 mL (0.083 %) | RESPIRATORY_TRACT | Qty: 1

## 2016-03-03 MED FILL — MIDAZOLAM 1 MG/ML IJ SOLN: 1 mg/mL | INTRAMUSCULAR | Qty: 6

## 2016-03-03 MED FILL — SODIUM CHLORIDE 0.9 % IV: INTRAVENOUS | Qty: 1000

## 2016-03-03 MED FILL — RENAGEL 800 MG TABLET: 800 mg | ORAL | Qty: 1

## 2016-03-03 MED FILL — XYLOCAINE 20 MG/ML (2 %) INJECTION SOLUTION: 20 mg/mL (2 %) | INTRAMUSCULAR | Qty: 10

## 2016-03-03 NOTE — Consults (Signed)
[facility]   [address] [place] [address]   CONSULTATION       Name:  David David [name], David David [name]   David#:  [id number]   DOB:  11/15/1970   Account #:  [id number]    Date of Consultation:  03/02/2016   Date of Adm:  02/29/2016       REQUESTING PHYSICIAN: Varun K. Golla, MD    REASON FOR CONSULTATION: Recurrent C. Difficile colitis.    HISTORY OF PRESENT ILLNESS: David David [name]  is a 45-year-old male   with a past medical history significant for End Stage Renal Disease on   Hemodialysis for past 7 years, history of Ascites . He also has a h/o Recurrent  C. Diff Colitis .  Patient was admitted secondary to nausea, vomiting and diarrhea.  C. difficile colitis has been present since February of   this year. Pt states he was treated for  Pneumonia in December 2016.   He was placed on antibiotics. Thereafter he developed diarrhea. He was diagnosed with  C. Difficile Colitis. He has been treated with rounds   of Vancomycin, Flagyl and Fidaxomicin.     He developed pneumonia again in September  & was treated with antibiotics .  He had another recurrence of C.diff    was treated 1 month ago at [name] .   He finished a course of C. difficile treatment and then   he started to develop symptoms about 3 days ago.. Pt has been having     drainage of ascites done on a regular basis. He has  had multiple   Tests done for ascites including  Liver biopsy at [name]. He was   told that he did not have cirrhosis. The patient continues to require   paracentesis. Pt denies alcohol use.     On ROS The patient says he has about 5 bowel movements a day. He also has some abdominal   pain with bowel movements. Currently, no nausea or vomiting. No fever, chills     PAST MEDICAL HISTORY:  History of ESRD   Hemodialysis Monday, Wednesday, Friday. Hypertension,   Ascites, Recurrent C. difficile colitis,Asthma.    PAST SURGICAL HISTORY: Hernia repair.    ALLERGIES    1. LISINOPRIL.    2. MORPHINE.   3. TORADOL.     SOCIAL HISTORY: The patient smokes half pack per day. He has   been smoking over 10 years. Denies alcohol use. Denies recreational   drug use.    FAMILY HISTORY: Father has hypertension. Mother has   hypertension. No history of liver disease.    MEDICATIONS PRIOR TO ADMISSION    1. Zofran 8 mg p.o. as needed.   2. Albuterol ProAir 90 mcg 2 puffs every 6 hours as needed.   3. Medrol Pak 4 mg as needed.   4. Renagel 800 mg 3 times daily.   5. Coreg 12.5 mg b.i.David [name].   6. Hydralazine 100 mg 3 times a day.   7. Amlodipine 10 mg by mouth daily.   8. Protonix 40 mg daily.   9. Furosemide 80 daily.     PHYSICAL EXAMINATION   GENERAL: This is a middle-aged male sitting up in bed, presently in   no acute distress.   VITAL SIGNS: Pulse 91, blood pressure 145/83, respirations 12,   temperature 98.1, T-max 98.6.   NECK: Normocephalic, atraumatic. Pupils equal, round, and reactive.   Oral: No erythema of   pharynx.   LUNGS: Reduced air entry in bases bilaterally.   HEART: First and second heart sounds heard. No murmurs.   ABDOMEN: Distended, some tenderness on palpation. Bowel   sounds present in all 4 quadrants.   EXTREMITIES: No clubbing, cyanosis, or edem. Peripheral pulses   felt bilaterally and equally.    PERTINENT LABORATORY DATA: WBC 6.3, neutrophils 28,   lymphocytes 10, hemoglobin 8, hematocrit 25.5, platelets 116. BUN   43, creatinine 8.86. Paracentesis fluid culture, no organisms seen, no   growth after 16 hours. Albumin was 1.6, total protein 3.5. Cell count:   WBC 238, neutrophils 18%. C. difficile DNA was positive.     CT of his abdomen, no acute intra-abdominal findings, moderate   ascites.    CLINICAL IMPRESSION: This  Is  patient with End Sttage Renal disease on   Hemodialysis, Ascites, requiring paracentesis on a regular basis,   etiology unknown, with recurrent C. difficile colitis. The patient says this   his 10th episode. He has been on Vancomycin p.o., Flagyl, Fidaxomicin,    with improvement on medication, recurrence of symptoms on completion of therapy..     In case of C.Difficile colitis   with multiple recurrences.   the recommended treatment is FMT (fecal microbiota   transplant). While waiting for FMT, recommendation is Vancomycin   pulse dose taper with probiotics. This is preferred over Fidaxomicin in this case.   Fidaxomicin would be preferred for for initial recurrence.  The patient would  benefit from Colonoscopy at some point when he is stable & free of diarrhea.   I would suggest changing empiric antibiotics for coverage of peritonitis with   Ceftriaxone IV and Vancomycin to be given after hemodialysis.   Patient would probably benefit from Liver biopsy to determine etiology   of the ascites. Details of his previous biopsy many years ago are   unknown. I would also recommend ascitic fluid being sent for   Cytology, AFB smear and culture, CEA and AFP;Also  HIV   Testing. Hepatitis B and Ct tests are  negative .    I have  requested [facility]   obtained.     Recommendations were discussed at length with Dr. Darlina RumpfGolla, Hospitalist.        Shelbie Franken S. Driscilla MoatsSINNATAMBY, MD FACP     DS / THS   David [name]:  03/02/2016   23:35   T:  03/03/2016   06:44   Job #:  [id number]

## 2016-03-03 NOTE — Progress Notes (Signed)
Pt received a small amt of their IV vancomycin. Pt asked for it to be disconnected at 0130. Pt stated that it was causing them to be sob. O2 sats 99%. O2 adm for comfort. Pt asked for breathing tx. Spoke with on call hospitalist and got new order for albuterol neb tx Q4 PRN. After receiving tx and wearing O2 pt stated they felt better. This Clinical research associatewriter asked pt if Vanc could be hooked back up and continued. Pt refused still. Pt ambulating in hall without O2. No s/sx distress or sob.

## 2016-03-03 NOTE — Consults (Signed)
[facility]   [address]   [place], [place] [id number]   CONSULTATION       Name:  David LindenMORGAN, Bohdan D   MR#:  [id number]   DOB:  1971-01-15   Account #:  [id number]    Date of Consultation:  03/01/2016   Date of Adm:  02/29/2016       CHIEF COMPLAINT: Left arm swelling.    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 He is a chronic   hemodialysis the patient who is cared for in [place] and   [place] who is currently dialyzing through a left IJ tunneled   catheter. He has a left upper arm fistula in place which he says is a   nonfunctional. I am asked to see him because during this   hospitalization, his left arm has become markedly swollen, which is a   new finding.    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    For his complete past medical, past surgical history, medications, drug   allergies, please see the complete admission history and physical.    PHYSICAL EXAMINATION   GENERAL: He is a healthy, comfortable-appearing gentleman in no   acute distress.   HEAD AND NECK: Significant for a left IJ tunneled catheter. He has   collateral veins around his neck and shoulder.   LUNGS: Clear.   HEART: Regular.   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 He has a palpable thrill near the   anastomosis at the elbow, but no pulse or thrill more distally up the   arm in the aneurysmal areas.    IMPRESSION: Left arm edema in the face of dialysis access, central   venous stenoses and indwelling catheter. The patient almost certainly   has central venous cause for his edema. We will try to get a left arm   fistulogram and central venogram while he is an inpatient, and we can    discuss options following clarification of his anatomy.        Janann ColonelJEFFREY A. Dinesha Twiggs, MD      JAB / MP   D:  03/03/2016   16:55   T:  03/03/2016   17:13   Job #:  [id number]

## 2016-03-03 NOTE — Progress Notes (Signed)
11:15- [facility] [phone]) to request chart.

## 2016-03-03 NOTE — Progress Notes (Signed)
Pt arrived in xray recovery for procedure.

## 2016-03-03 NOTE — Progress Notes (Signed)
Bedside shift change report given to Carlita (Cabin crewoncoming nurse) by Aundra MilletMegan (offgoing nurse). Report included the following information SBAR, Kardex, Intake/Output, MAR and Recent Results.    Zone Phone for oncoming shift:   [phone]    Shift Summary: Pt to have dialysis tonight. PRN pain medication given.     LDAs               Peripheral IV 02/29/16 Right Other(comment) (Active)   Site Assessment Clean, dry, & intact 03/03/2016  4:27 PM   Phlebitis Assessment 0 03/03/2016  4:27 PM   Infiltration Assessment 0 03/03/2016  4:27 PM   Dressing Status Clean, dry, & intact 03/03/2016  4:27 PM   Dressing Type Transparent;Tape 03/03/2016  4:27 PM   Hub Color/Line Status Pink;Flushed;Capped 03/03/2016  4:27 PM        Hemodialysis Catheter (Active)   Central Line Being Utilized Yes 03/03/2016  4:27 PM   Criteria for Appropriate Use Dialysis/apheresis 03/03/2016  4:27 PM   Site Assessment Clean, dry, & intact 03/03/2016  4:27 PM   Date of Last Dressing Change 03/01/16 03/03/2016  4:27 PM   Dressing Status Clean, dry, & intact 03/03/2016  4:27 PM   Dressing Type Disk with Chlorhexadine gluconate (CHG);Transparent 03/03/2016  4:27 PM   Proximal Hub Color/Line Status Blue 03/03/2016  4:27 PM   Distal Hub Color/Line Status Red 03/03/2016  4:27 PM                    Intake & Output   Date 03/02/16 1900 - 03/03/16 0659 03/03/16 0700 - 03/04/16 0659   Shift 1900-0659 24 Hour Total 0700-1859 1900-0659 24 Hour Total   I  N  T  A  K  E   Shift Total  (mL/kg)        O  U  T  P  U  T   Stool           Stool Occurrence(s) 3 x 3 x       Shift Total  (mL/kg)        NET        Weight (kg) 111.5 111.5 111.5 111.5 111.5      Last Bowel Movement Last Bowel Movement Date: 03/01/16   Glucose Checks []  N/A  []  AC/HS  []  Q6  Concerns:   Nutrition Active Orders   Diet    DIET RENAL WITH OPTIONS 80-80-80; Regular       Consults [] PT  [] OT  [] Speech  [] Case Management   Cardiac Monitoring [] N/A [] Yes Expires:

## 2016-03-03 NOTE — Progress Notes (Signed)
Hoping to get LUE and cenrtal venogram today to delineate anatomy.   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

## 2016-03-03 NOTE — Progress Notes (Signed)
Progress Note    NAME: David [name]   DOB:  03-19-1971   MRN:  [id number]     Date/Time:  03/03/2016    Assessment :    Plan:  esrd  Hyperkalemia  Mild pulmonary edema on cxr  Uncontrolled htn  Perm cath  Failed AV accesses  Anemia of CKD  Swollen arm/hand     HD MWF  Using pc  Pt asked for a regular diet-,had to add veltassa to help keep k under control  Dr. Manson PasseyBrown evaluating pt-has central stenosis-appreciate his help  Calcium corrects to normal-checked phos/pth > 1900-added iv hectoral with HD and sensipar--will see if he takes it, as patient is   non compliant with medical therapy  sw needs to start working on his outpatient unit-reportedly [facility] [place]    ID/GI following to recommend best cdiff treatment       Subjective:   CHIEF COMPLAINT:  Can I have a regular diet?    Past Medical History:   Diagnosis Date   ??? Ascites    ??? Asthma    ??? C. difficile colitis    ??? CKD (chronic kidney disease) stage V requiring chronic dialysis (HCC)    ??? HTN (hypertension)    ??? SBP (spontaneous bacterial peritonitis) (HCC)       Past Surgical History:   Procedure Laterality Date   ??? HX HERNIA REPAIR       Social History   Substance Use Topics   ??? Smoking status: Heavy Tobacco Smoker     Packs/day: 0.50     Years: 10.00   ??? Smokeless tobacco: Never Used   ??? Alcohol use No      History reviewed. No pertinent family history.   Allergies   Allergen Reactions   ??? Lisinopril Shortness of Breath   ??? Morphine Shortness of Breath   ??? Toradol [Ketorolac] Shortness of Breath      Prior to Admission medications    Medication Sig Start Date End Date Taking? Authorizing Provider   ondansetron (ZOFRAN ODT) 8 mg disintegrating tablet Take 1 Tab by mouth every eight (8) hours as needed for Nausea. 01/15/16   Lanell Personsara Marks, MD   albuterol (PROVENTIL HFA, VENTOLIN HFA, PROAIR HFA) 90 mcg/actuation inhaler Take 2 Puffs by inhalation every six (6) hours as needed for Wheezing. 12/19/15   Loni BeckwithWilliam Azie, MD    methylPREDNISolone (MEDROL, PAK,) 4 mg tablet Use as directed 12/19/15   Loni BeckwithWilliam Azie, MD   sevelamer (RENAGEL) 400 mg tablet Take 800 mg by mouth three (3) times daily (with meals).    Phys Other, MD   carvedilol (COREG) 12.5 mg tablet Take  by mouth two (2) times daily (with meals).    Phys Other, MD   hydrALAZINE (APRESOLINE) 100 mg tablet Take 100 mg by mouth three (3) times daily.    Phys Other, MD   amLODIPine (NORVASC) 10 mg tablet Take 10 mg by mouth daily.    Phys Other, MD   pantoprazole (PROTONIX) 40 mg tablet Take 40 mg by mouth daily.    Phys Other, MD   furosemide (LASIX) 80 mg tablet Take 80 mg by mouth daily.    Phys Other, MD   albuterol (PROVENTIL HFA, VENTOLIN HFA, PROAIR HFA) 90 mcg/actuation inhaler Take 2 Puffs by inhalation every four (4) hours as needed for Wheezing.    Phys Other, MD     REVIEW OF SYSTEMS:     (+) hand pain/swelling  Objective:   VITALS:    Visit Vitals   ??? BP 146/83   ??? Pulse 91   ??? Temp 98.1 ??F (36.7 ??C)   ??? Resp 18   ??? Ht 6\' 1"  (1.854 m)   ??? Wt 111.5 kg (245 lb 13 oz)   ??? SpO2 100%   ??? BMI 32.43 kg/m2     PHYSICAL EXAM:  Gen:  [x]   WD [x]   WN  []  cachectic []   thin []   obese []   disheveled             []   ill apearing  []    Critical  []    Chronic    [x]   No acute distress    HEENT:   [x]  NC/AT/PERRLA/EOMI    []  pink conjunctivae      []  pale conjunctivae                  PERRL  []  yes  []  no      []  moist mucosa    []  dry mucosa    hearing intact to voice [x]  yes  []  No                 NECK:   supple [x]  yes  []  no        masses []  yes  []  No               thyroid  []   non tender  []   tender    RESP:   [x]  CTA bilaterally/no wheezing/rhonchi/rales/crackles    []  rhonchi bilaterally - no dullness  []  wheezing   []  rhonchi   []  crackles     use of accessory muscles []  yes [x]  no    CARD:   [x]   regular rate and rhythm    murmur  []  yes ()  []  no      Rubs  []  yes  []  no       Gallops []  yes  []  no    Rate []   regular  []   irregular        carotid bruits  []  Right  []    Left                 LE edema []  yes  [x]  no      Has upper ext swelling        ABD:    []  soft/non distended/non tender/+bowel sounds/no HSM    []   Rigid    tenderness []  yes []  no   Liver enlargement  []   yes []   no                Spleen enlargement  []   yes []   no     distended []   yes []  no     bowel sound  []  hypoactive   []  hyperactive    NEUR:   [x]  cranial nerves II-XII grossly intact       []  Cranial nerves deficit                 []   facial droop    []   slurred speech   []  aphasic     []  Strength normal     []   weakness  []   LUE  []    RUE/ []   LLE  []    RLE    follows commands  [x]   yes []   no  LAB DATA REVIEWED:    Recent Labs      03/02/16   0550  03/01/16   0256   WBC  6.3  6.7   HGB  8.0*  8.2*   HCT  25.5*  24.6*   PLT  116*  123*     Recent Labs      03/02/16   0550   NA  140   K  5.5*   CL  104   CO2  23   BUN  43*   CREA  8.86*   GLU  78   CA  6.6*   6.6*   MG  1.8   PHOS  5.1*     No results for input(s): SGOT, GPT, ALT, AP, TBIL, TBILI, ALB, GLOB, GGT, AML, LPSE in the last 72 hours.    No lab exists for component: AMYP, HLPSE  No results for input(s): INR, PTP, APTT in the last 72 hours.    No lab exists for component: INREXT, INREXT   No results for input(s): FE, TIBC, PSAT, FERR in the last 72 hours.   No results for input(s): PH, PCO2, PO2 in the last 72 hours.  No results for input(s): CPK, CKMB in the last 72 hours.    No lab exists for component: TROPONINI  Lab Results   Component Value Date/Time    Glucose (POC) 105 02/29/2016 11:05 AM       Procedures: see electronic medical records for all procedures/Xrays and details which were not copied into this note but were reviewed prior to creation of Plan.    ________________________________________________________________________       ___________________________________________________  Consulting Physician: Gary Fleetrudy L Jaselyn Nahm, MD

## 2016-03-03 NOTE — Progress Notes (Signed)
Primary RN contacted regarding patient preparedness for IR procedure - Pt ate ordered renal diet, Dr. Lorenda PeckWeinberg aware.  Requested to keep pt NPO starting now.

## 2016-03-03 NOTE — Other (Signed)
Bedside and Verbal shift change report given to Luticia Tadros RN (oncoming nurse) by Penny RN (offgoing nurse).  Report given with SBAR, Kardex, MAR and Recent Results.

## 2016-03-03 NOTE — Progress Notes (Signed)
Pt admitted with peritonitis.  Pt lives alone in [place]; does not want his ex wife involved; has a son in [place]; was visiting his sister Damian Leavellrudy in [place]; uses oxygen at home for comfort; is not pleased with [facility] in [place] and want to switch to [facility], has no PCP, and sister will transport him home.  [facility] [place] to see if they are aware of the above.      Care Management Interventions  PCP Verified by CM: Yes  Mode of Transport at Discharge: Self  Transition of Care Consult (CM Consult): Discharge Planning  MyChart Signup: No  Discharge Durable Medical Equipment: Yes  Health Maintenance Reviewed: Yes  Physical Therapy Consult: No  Occupational Therapy Consult: No  Speech Therapy Consult: No  Confirm Follow Up Transport: Family  Plan discussed with Pt/Family/Caregiver: Yes  Freedom of Choice Offered: Yes  Discharge Location  Discharge Placement: Home

## 2016-03-03 NOTE — Progress Notes (Signed)
LUE fistulagram and central venograms reviewed: his LUE cephalic vein fistula is large and patent despite his belief to the contrary. 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  Options:    1. Ligate LUE AVF which will resolve arm swelling but leave him with only catheter access.         2. Begin to cannulate AVF again (no one has tried since July) and if successful remove catheter and treat central stenosis with balloon angioplasty at same time.     He wants access in R forearm which is not an option. He is considering. Any of above can be accomplished here or [facility] at any time as medical situation allows.

## 2016-03-03 NOTE — H&P (Signed)
Radiology History and Physical    Patient: Laqueta Lindenroy D Athey 45 y.o. male       Chief Complaint: Shortness of Breath (patient complain of shortness of breath and is diaylzed M/W/F) and Vomiting (patient also complain of vomiting)      History of Present Illness: arm swelling for hd fistulagram    History:    Past Medical History:   Diagnosis Date   ??? Ascites    ??? Asthma    ??? C. difficile colitis    ??? CKD (chronic kidney disease) stage V requiring chronic dialysis (HCC)    ??? HTN (hypertension)    ??? SBP (spontaneous bacterial peritonitis) (HCC)      History reviewed. No pertinent family history.  Social History     Social History   ??? Marital status: OTHER     Spouse name: N/A   ??? Number of children: N/A   ??? Years of education: N/A     Occupational History   ??? Not on file.     Social History Main Topics   ??? Smoking status: Heavy Tobacco Smoker     Packs/day: 0.50     Years: 10.00   ??? Smokeless tobacco: Never Used   ??? Alcohol use No   ??? Drug use: No   ??? Sexual activity: Not on file     Other Topics Concern   ??? Not on file     Social History Narrative       Allergies:   Allergies   Allergen Reactions   ??? Lisinopril Shortness of Breath   ??? Morphine Shortness of Breath   ??? Toradol [Ketorolac] Shortness of Breath       Current Medications:  Current Facility-Administered Medications   Medication Dose Route Frequency   ??? albuterol (PROVENTIL VENTOLIN) nebulizer solution 2.5 mg  2.5 mg Nebulization Q4H PRN   ??? peg 3350-electrolytes (COLYTE) 4000 mL  4,000 mL Oral ONCE   ??? epoetin alfa (EPOGEN;PROCRIT) injection 10,000 Units  10,000 Units SubCUTAneous Q MON, WED & FRI   ??? heparinized saline 2 units/mL infusion 1,200 Units  600 mL Irrigation ONCE   ??? iopamidol (ISOVUE-370) 76 % injection 100 mL  100 mL IntraVENous ONCE   ??? lidocaine (XYLOCAINE) 20 mg/mL (2 %) injection 200 mg  10 mL SubCUTAneous ONCE   ??? fentaNYL citrate (PF) injection 100 mcg  100 mcg IntraVENous Multiple    ??? 0.9% sodium chloride infusion  25 mL/hr IntraVENous CONTINUOUS   ??? midazolam (VERSED) injection 5 mg  5 mg IntraVENous Multiple   ??? cinacalcet (SENSIPAR) tablet 30 mg  30 mg Oral DAILY WITH BREAKFAST   ??? doxercalciferol (HECTOROL) 4 mcg/2 mL injection 2 mcg  2 mcg IntraVENous DIALYSIS MON, WED & FRI   ??? vancomycin (VANCOCIN) 750 mg in 0.9% sodium chloride (MBP/ADV) 250 mL  750 mg IntraVENous DIALYSIS PRN   ??? cefTRIAXone (ROCEPHIN) 2 g in 0.9% sodium chloride (MBP/ADV) 50 mL  2 g IntraVENous Q24H   ??? vancomycin 50 mg/mL oral solution (compounded) 250 mg  250 mg Oral Q6H   ??? patiromer calcium sorbitex (VELTASSA) powder 8.4 g  8.4 g Oral DAILY   ??? albuterol (PROVENTIL HFA, VENTOLIN HFA, PROAIR HFA) inhaler 2 Puff  2 Puff Inhalation Q6H PRN   ??? cloNIDine HCl (CATAPRES) tablet 0.1 mg  0.1 mg Oral Q12H   ??? sodium chloride (NS) flush 5-10 mL  5-10 mL IntraVENous PRN   ??? amLODIPine (NORVASC) tablet 10 mg  10 mg  Oral DAILY   ??? carvedilol (COREG) tablet 12.5 mg  12.5 mg Oral BID WITH MEALS   ??? sevelamer (RENAGEL) tablet 800 mg  800 mg Oral TID WITH MEALS   ??? sodium chloride (NS) flush 5-10 mL  5-10 mL IntraVENous Q8H   ??? ondansetron (ZOFRAN) injection 4 mg  4 mg IntraVENous Q4H PRN   ??? HYDROmorphone (PF) (DILAUDID) injection 2 mg  2 mg IntraVENous Q3H PRN        Physical Exam:  Blood pressure 148/82, pulse 98, temperature 98 ??F (36.7 ??C), resp. rate 18, height 6\' 1"  (1.854 m), weight 111.5 kg (245 lb 13 oz), SpO2 98 %.  LUNG: clear to auscultation bilaterally, HEART: regular rate and rhythm      Alerts:    Hospital Problems  Date Reviewed: 02/29/2016          Codes Class Noted POA    Diarrhea ICD-10-CM: R19.7  ICD-9-CM: 787.91  02/29/2016 Unknown              Laboratory:      Recent Labs      03/02/16   0550   HGB  8.0*   HCT  25.5*   WBC  6.3   PLT  116*   BUN  43*   CREA  8.86*   K  5.5*         Plan of Care/Planned Procedure:  Risks, benefits, and alternatives reviewed with patient and he agrees to  proceed with the procedure.       Maudie FlakesGregg D Kaizlee Carlino, MD

## 2016-03-03 NOTE — Progress Notes (Signed)
Hospitalist Progress Note  NAME: David [name] D Wanek   DOB:  [birth year]   MRN:  [id number]       Assessment / Plan:  Recurrent C diff  (10 th episode per pt )  -Pt with abdominal pain, nausea, vomiting, and decreased PO intake on presentation   -c diff is positive   - on enteric precautions  -Last abx use was last month and he supposedly received Flagyl and Vanco  -D/w GI and ID and will continue vancomycin and pt will be scheduled for Fecal transplant  tomorrow via  Colonoscopy  -HIV ordered by ID      ESRD on HD MWF for 7 years, missed 11/10 session  Hyperkalemia  LUE swelling  - no peaked T waves on EKG  -Given hyperkalemia cocktail by ED  -AVF LUE is not working as per the pt, which is why he has a L chest catheter  -LUE US pending shows patent veins and being evaluated by Dr Manson PasseyBrown  -Going for venogram today per Dr Manson PasseyBrown    Peritoneal fluid cell count 238 with concern for peritonitis  -d/w Dr Raiford Nobleick man and Dr Shella MaximSittamby  -they recommend to stop vancomycin and stop ceftriaxone after 48 hours as the peritoneal fluid culture is negative and this is most likely resolving peritonitis per Dr Mora Applickman.    Suspected viral URTI  -lung exam is benign.  -no fever or leucocytosis  -get portable CXR    Hypertension, uncontrolled  -Should improve with dialysis  -BP stable     Hx of Hepatitis B  -needs out pt f/u on discharge    Recurrent Ascites unclear cause work up underway  -CT AP noted, no evidence of cirrhosis - pt denies EtOH use in the past 10 years   -States that he has been worked up for this in the past and it is not because of cirrhosis  -SAAG is less than  1.1 so protal HTN is unlikely and Lft's are normal   -Echo shows normal EF and no diastolic dysfunction. He does not make any urine to look for urine protein  -will get records from his pcp to look for tests done on his liver  -palliative care consult due to multiple readmissions      Body mass index is 32.43 kg/(m^2).    Code status: Full  Prophylaxis: SCD   Recommended Disposition: Home w/Family     Subjective:     Chief Complaint / Reason for Physician Visit  He reports that he is going through a cold today and does not feel better. He is on room air and  walking in the room air. Does not report any sob.    Review of Systems:  Symptom Y/N Comments  Symptom Y/N Comments   Fever/Chills    Chest Pain n    Poor Appetite n   Edema     Cough    Abdominal Pain y    Sputum    Joint Pain     SOB/DOE n   Pruritis/Rash     Nausea/vomit n   Tolerating PT/OT y    Diarrhea y   Tolerating Diet y    Constipation n   Other y congestion     Could NOT obtain due to:      Objective:     VITALS:   Last 24hrs VS reviewed since prior progress note. Most recent are:  Patient Vitals for the past 24 hrs:   Temp Pulse Resp BP SpO2  03/03/16 1456 98 ??F (36.7 ??C) 98 18 148/82 98 %   03/03/16 1412 97.9 ??F (36.6 ??C) 95 20 148/89 98 %   03/03/16 0725 98.1 ??F (36.7 ??C) 91 - 146/83 100 %   03/03/16 0240 - - - - 100 %   03/03/16 0130 97.7 ??F (36.5 ??C) 95 18 (!) 173/102 99 %   03/02/16 1937 97.8 ??F (36.6 ??C) 73 16 142/66 97 %   03/02/16 1831 98.1 ??F (36.7 ??C) 91 16 145/82 96 %     No intake or output data in the 24 hours ending 03/03/16 1507     PHYSICAL EXAM:  General:  cooperative, no acute distress????  EENT:  EOMI. Anicteric sclerae. MMM  Resp:  Crackles bibasilar  CV:  Regular  rhythm,?? No edema  GI:  Soft, non tender, distended, BS+, dressing + over paracentesis site   Neurologic:?? Alert and oriented X 3, normal speech, no focal deficits  Psych:???? Fair insight.??Not anxious nor agitated  Skin:  No rashes.  No jaundice.  MSK:  LUE swelling, radial pulse +     Reviewed most current lab test results and cultures  YES  Reviewed most current radiology test results   YES  Review and summation of old records today    NO  Reviewed patient's current orders and MAR    YES  PMH/SH reviewed - no change compared to H&P      Current Facility-Administered Medications:    ???  albuterol (PROVENTIL VENTOLIN) nebulizer solution 2.5 mg, 2.5 mg, Nebulization, Q4H PRN, Tanja PortPhillip D Klahr, MD  ???  peg 3350-electrolytes (COLYTE) 4000 mL, 4,000 mL, Oral, ONCE, Lamar SprinklesJonathan G Gaspar, MD  ???  epoetin alfa (EPOGEN;PROCRIT) injection 10,000 Units, 10,000 Units, SubCUTAneous, Q MON, WED & FRI, Gary Fleetrudy L Rickman, MD  ???  heparinized saline 2 units/mL infusion 1,200 Units, 600 mL, Irrigation, ONCE, Maudie FlakesGregg D Weinberg, MD  ???  iopamidol (ISOVUE-370) 76 % injection 100 mL, 100 mL, IntraVENous, ONCE, Maudie FlakesGregg D Weinberg, MD  ???  lidocaine (XYLOCAINE) 20 mg/mL (2 %) injection 200 mg, 10 mL, SubCUTAneous, ONCE, Maudie FlakesGregg D Weinberg, MD  ???  fentaNYL citrate (PF) injection 100 mcg, 100 mcg, IntraVENous, Multiple, Maudie FlakesGregg D Weinberg, MD  ???  0.9% sodium chloride infusion, 25 mL/hr, IntraVENous, CONTINUOUS, Maudie FlakesGregg D Weinberg, MD  ???  midazolam (VERSED) injection 5 mg, 5 mg, IntraVENous, Multiple, Maudie FlakesGregg D Weinberg, MD  ???  cinacalcet (SENSIPAR) tablet 30 mg, 30 mg, Oral, DAILY WITH BREAKFAST, Gary Fleetrudy L Rickman, MD, Stopped at 03/03/16 0800  ???  doxercalciferol (HECTOROL) 4 mcg/2 mL injection 2 mcg, 2 mcg, IntraVENous, DIALYSIS MON, WED & FRI, Gary Fleetrudy L Rickman, MD  ???  vancomycin (VANCOCIN) 750 mg in 0.9% sodium chloride (MBP/ADV) 250 mL, 750 mg, IntraVENous, DIALYSIS PRN, Vena AustriaVarun K Shivani Barrantes, MD  ???  cefTRIAXone (ROCEPHIN) 2 g in 0.9% sodium chloride (MBP/ADV) 50 mL, 2 g, IntraVENous, Q24H, Richardean Chimeraiane S Sinnatamby, MD, Last Rate: 100 mL/hr at 03/02/16 2053, 2 g at 03/02/16 2053  ???  vancomycin 50 mg/mL oral solution (compounded) 250 mg, 250 mg, Oral, Q6H, Richardean Chimeraiane S Sinnatamby, MD, 250 mg at 03/03/16 1300  ???  patiromer calcium sorbitex (VELTASSA) powder 8.4 g, 8.4 g, Oral, DAILY, Gary Fleetrudy L Rickman, MD, Stopped at 03/03/16 0900  ???  albuterol (PROVENTIL HFA, VENTOLIN HFA, PROAIR HFA) inhaler 2 Puff, 2 Puff, Inhalation, Q6H PRN, Virl DiamondJoanne E Lapetina, MD, 2 Puff at 03/01/16 1817  ???  cloNIDine HCl (CATAPRES) tablet 0.1 mg, 0.1 mg, Oral, Q12H, Damian Leavellrudy  Cleda Clarks, MD, 0.1 mg at 03/02/16 0900  ???  sodium chloride (NS) flush 5-10 mL, 5-10 mL, IntraVENous, PRN, Edward Jolly. Providence Lanius, MD, 10 mL at 03/03/16 1244  ???  amLODIPine (NORVASC) tablet 10 mg, 10 mg, Oral, DAILY, Earvin Hansen, MD, 10 mg at 03/02/16 0901  ???  carvedilol (COREG) tablet 12.5 mg, 12.5 mg, Oral, BID WITH MEALS, Earvin Hansen, MD, 12.5 mg at 03/02/16 1743  ???  sevelamer (RENAGEL) tablet 800 mg, 800 mg, Oral, TID WITH MEALS, Earvin Hansen, MD, Stopped at 03/03/16 0800  ???  sodium chloride (NS) flush 5-10 mL, 5-10 mL, IntraVENous, Q8H, Earvin Hansen, MD, 10 mL at 03/03/16 0523  ???  ondansetron [facility]) injection 4 mg, 4 mg, IntraVENous, Q4H PRN, Earvin Hansen, MD, 4 mg at 03/02/16 1817  ???  HYDROmorphone (PF) (DILAUDID) injection 2 mg, 2 mg, IntraVENous, Q3H PRN, Virl Diamond, MD, 2 mg at 03/03/16 1244    ________________________________________________________________________  Care Plan discussed with:    Comments   Patient y    Family      RN y    Care Manager y    Consultant  y Dr [name], Dr Mora Appl, Dr Shella Maxim                     Multidiciplinary team rounds were held today with case manager, nursing, pharmacist and clinical coordinator.  Patient's plan of care was discussed; medications were reviewed and discharge planning was addressed.     ________________________________________________________________________  Total NON critical care TIME:  35   Minutes    Total CRITICAL CARE TIME Spent:   Minutes non procedure based      Comments   >50% of visit spent in counseling and coordination of care y    ________________________________________________________________________  Vena Austria, MD     Procedures: see electronic medical records for all procedures/Xrays and details which were not copied into this note but were reviewed prior to creation of Plan.      LABS:  I reviewed today's most current labs and imaging studies.  Pertinent labs include:  Recent Labs      03/02/16   0550  03/01/16   0256    WBC  6.3  6.7   HGB  8.0*  8.2*   HCT  25.5*  24.6*   PLT  116*  123*     Recent Labs      03/02/16   0550   NA  140   K  5.5*   CL  104   CO2  23   GLU  78   BUN  43*   CREA  8.86*   CA  6.6*   6.6*   MG  1.8   PHOS  5.1*       Signed: Vena Austria, MD

## 2016-03-03 NOTE — Progress Notes (Addendum)
Bedside and Verbal shift change report given to Medical illustratorCarlita RN (oncoming nurse) by Baron HamperMeagan RN (offgoing nurse).  Report given with SBAR, Kardex, MAR and Recent Results.  Per off going nurse patient was to receive dialysis at some point tonight and labs from the 15 th would be drawn at that time. Patient drinking bowel prep for colonoscopy in am.    2200 Patient refused blood pressure stating that he was going to have dialysis tonight.     03/04/16 0420 No one from [facility] called or came so this nurse called Tula NakayamaSteven RN, the on call dialysis nurse. He stated that he was just completing dialysis with another patient and would be over as soon as possible. ETA 5 am. Press photographerCharge nurse and supervisor informed.       Patient noncompliant with medication regimen.    Patient noted coming from elevators. He was fully dressed and had on a coat. Patient states that he went for a walk. This nurse was in with another patient at the time. Patient asked not to leave unit without informing nurse/charge nurse. He stated that he went for a walk.

## 2016-03-03 NOTE — Progress Notes (Addendum)
A new PCP appointment has been scheduled with Dr Ree Kidaoopareliya for Nov 22 at 2;00pm

## 2016-03-03 NOTE — Progress Notes (Addendum)
GI Progress Note David [name](David [name])  NAME:David Aquilla HackerD [name] DOB:1971/01/03 ZOX:[id number]   PCP: None  Date/Time:  03/03/2016 8:06 AM   Assessment:   1. Recurrent C diff (pt reports 10 episodes since 1/17; though [facility] [place] and records are not currently available)  2. Abdominal ascites which he reports has been worked up in past  3. ESRD on HD      Plan:   ?? ID recommendations noted and appreciated. Agree with PO Vancomycin for reasons stated in ID note  ?? FMT indicated for refractory/recurrent CDI. Options include by UGI tract (i.e. ND/NJ tube) vs. LGI tract (via colonoscopy). Pt is adamantly against any tube placement and thus this would necessitate via colonoscopy route.   ?? Timing for FMT via colonoscopy David/w Dr. Darlina [name] as pt reports cold-like sx and thus would want respiratory status optimized  ?? 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 In this case would consider Nephrogenic cause. Next time patient has a paracentesis would recommend sending for cytology and ADA. Given his ascites is not 2/2 portal hypertension would defer further w/u and management to Hospitalist/Nephrologist. ID recs also noted  ?? Pt does not need a liver biopsy as he reports he has already had one (which was reportedly normal) and again low SAAG goes against liver etiology    Addendum:  David/w Dr. Darlina [name]. Pt saturating 100% on RA w/ ambulation with SOB. Plan for FMT via colonoscopy tomorrow. Continue CLD, Golytely prep this afternoon and NPO after midnight     Subjective:   Pt continues to have diarrhea.     Complaint Y/N Description   Abdominal Pain     Hematemesis     Hematochezia     Melena     Constipation     Diarrhea     Dyspepsia     Dysphagia     Jaundiced     Nausea/vomiting       Review of Systems:  Symptom Y/N Comments  Symptom Y/N Comments   Fever/Chills    Chest Pain     Cough    Headaches     Sputum    Joint Pain      SOB/DOE    Pruritis/Rash     Tolerating Diet    Other       Could NOT obtain due to:      Objective:   VITALS:   Last 24hrs VS reviewed since prior progress note. Most recent are:  Visit Vitals   ??? BP 146/83   ??? Pulse 91   ??? Temp 98.1 ??F (36.7 ??C)   ??? Resp 18   ??? Ht 6\' 1"  (1.854 m)   ??? Wt 111.5 kg (245 lb 13 oz)   ??? SpO2 100%   ??? BMI 32.43 kg/m2     No intake or output data in the 24 hours ending 03/03/16 0806  PHYSICAL EXAM:  General: WD, WN. Alert, cooperative, no acute distress????  HEENT: NC, Atraumatic.  PERRLA, EOMI. Anicteric sclerae.  Lungs:  CTA Bilaterally. No Wheezing/Rhonchi/Rales.  Heart:  Regular  rhythm,?? No murmur (), No Rubs, No Gallops  Abdomen: Soft, Non distended, +diffuse TTP w/o peritoneal signs, +fluid wave ??+Bowel sounds, no HSM  Extremities: No c/c/e  Neurologic:?? No acute neurological distress   Psych:???? Good insight.??Not anxious nor agitated.    Lab and Radiology Data Reviewed: (see below)    Medications Reviewed: (  see below)  PMH/SH reviewed - no change compared to H&P  ________________________________________________________________________    Care Plan discussed with:  Patient x   Family     RN               Consultant:  x     David Sprinkles, MD     Procedures: see electronic medical records for all procedures/Xrays and details which were not copied into this note but were reviewed prior to creation of Plan.      LABS:  Recent Labs      03/02/16   0550  03/01/16   0256   WBC  6.3  6.7   HGB  8.0*  8.2*   HCT  25.5*  24.6*   PLT  116*  123*     Recent Labs      03/02/16   0550   NA  140   K  5.5*   CL  104   CO2  23   BUN  43*   CREA  8.86*   GLU  78   CA  6.6*   6.6*   MG  1.8   PHOS  5.1*     No results for input(s): SGOT, GPT, AP, TBIL, TP, ALB, GLOB, GGT, AML, LPSE in the last 72 hours.    No lab exists for component: AMYP, HLPSE  No results for input(s): INR, PTP, APTT in the last 72 hours.    No lab exists for component: INREXT    No results for input(s): FE, TIBC, PSAT, FERR in the last 72 hours.   No results found for: FOL, RBCF  No results for input(s): PH, PCO2, PO2 in the last 72 hours.  No results for input(s): CPK, CKMB in the last 72 hours.    No lab exists for component: TROPONINI  No results found for: COLOR, APPRN, SPGRU, REFSG, PHU, PROTU, GLUCU, KETU, BILU, UROU, NITU, LEUKU, GLUKE, EPSU, BACTU, WBCU, RBCU, CASTS, UCRY    MEDICATIONS:  Current Facility-Administered Medications   Medication Dose Route Frequency   ??? albuterol (PROVENTIL VENTOLIN) nebulizer solution 2.5 mg  2.5 mg Nebulization Q4H PRN   ??? cinacalcet (SENSIPAR) tablet 30 mg  30 mg David DAILY WITH BREAKFAST   ??? doxercalciferol (HECTOROL) 4 mcg/2 mL injection 2 mcg  2 mcg IntraVENous DIALYSIS MON, WED & FRI   ??? vancomycin (VANCOCIN) 750 mg in 0.9% sodium chloride (MBP/ADV) 250 mL  750 mg IntraVENous DIALYSIS PRN   ??? Vancomycin 750 mg on dialysis days at end of dialysis  1 Each Other DAILY   ??? cefTRIAXone (ROCEPHIN) 2 g in 0.9% sodium chloride (MBP/ADV) 50 mL  2 g IntraVENous Q24H   ??? vancomycin 50 mg/mL David solution (compounded) 250 mg  250 mg David Q6H   ??? patiromer calcium sorbitex (VELTASSA) powder 8.4 g  8.4 g David DAILY   ??? albuterol (PROVENTIL HFA, VENTOLIN HFA, PROAIR HFA) inhaler 2 Puff  2 Puff Inhalation Q6H PRN   ??? cloNIDine HCl (CATAPRES) tablet 0.1 mg  0.1 mg David Q12H   ??? sodium chloride (NS) flush 5-10 mL  5-10 mL IntraVENous PRN   ??? amLODIPine (NORVASC) tablet 10 mg  10 mg David DAILY   ??? carvedilol (COREG) tablet 12.5 mg  12.5 mg David BID WITH MEALS   ??? sevelamer (RENAGEL) tablet 800 mg  800 mg David TID WITH MEALS   ??? sodium chloride (NS) flush 5-10 mL  5-10 mL IntraVENous Q8H   ???  ondansetron (ZOFRAN) injection 4 mg  4 mg IntraVENous Q4H PRN   ??? HYDROmorphone (PF) (DILAUDID) injection 2 mg  2 mg IntraVENous Q3H PRN

## 2016-03-04 LAB — POC CHEM8
Anion gap (POC): 18 mmol/L — ABNORMAL HIGH (ref 5–15)
BUN (POC): 29 MG/DL — ABNORMAL HIGH (ref 9–20)
CO2 (POC): 26 MMOL/L (ref 21–32)
Calcium, ionized (POC): 0.93 MMOL/L — ABNORMAL LOW (ref 1.12–1.32)
Chloride (POC): 99 MMOL/L (ref 98–107)
Creatinine (POC): 6.8 MG/DL — ABNORMAL HIGH (ref 0.6–1.3)
GFRAA, POC: 11 mL/min/{1.73_m2} — ABNORMAL LOW (ref >60)
GFRNA, POC: 9 mL/min/{1.73_m2} — ABNORMAL LOW (ref >60)
Glucose (POC): 96 MG/DL (ref 65–100)
Hematocrit (POC): 24 % — ABNORMAL LOW (ref 36.6–50.3)
Hemoglobin (POC): 8.2 GM/DL — ABNORMAL LOW (ref 12.1–17.0)
Potassium (POC): 4.3 MMOL/L (ref 3.5–5.1)
Sodium (POC): 138 MMOL/L (ref 136–145)

## 2016-03-04 LAB — CBC W/O DIFF
HCT: 24.9 % — ABNORMAL LOW (ref 36.6–50.3)
HGB: 8 g/dL — ABNORMAL LOW (ref 12.1–17.0)
MCH: 29.6 PG (ref 26.0–34.0)
MCHC: 32.1 g/dL (ref 30.0–36.5)
MCV: 92.2 FL (ref 80.0–99.0)
PLATELET: 129 10*3/uL — ABNORMAL LOW (ref 150–400)
RBC: 2.7 M/uL — ABNORMAL LOW (ref 4.10–5.70)
RDW: 18.3 % — ABNORMAL HIGH (ref 11.5–14.5)
WBC: 2.7 10*3/uL — ABNORMAL LOW (ref 4.1–11.1)

## 2016-03-04 LAB — METABOLIC PANEL, BASIC
Anion gap: 9 mmol/L (ref 5–15)
BUN/Creatinine ratio: 5 — ABNORMAL LOW (ref 12–20)
BUN: 49 MG/DL — ABNORMAL HIGH (ref 6–20)
CO2: 24 mmol/L (ref 21–32)
Calcium: 6.7 MG/DL — ABNORMAL LOW (ref 8.5–10.1)
Chloride: 104 mmol/L (ref 97–108)
Creatinine: 9.41 MG/DL — ABNORMAL HIGH (ref 0.70–1.30)
GFR est AA: 7 mL/min/{1.73_m2} — ABNORMAL LOW (ref >60)
GFR est non-AA: 6 mL/min/{1.73_m2} — ABNORMAL LOW (ref >60)
Glucose: 73 mg/dL (ref 65–100)
Potassium: 5.7 mmol/L — ABNORMAL HIGH (ref 3.5–5.1)
Sodium: 137 mmol/L (ref 136–145)

## 2016-03-04 LAB — MAGNESIUM: Magnesium: 2 mg/dL (ref 1.6–2.4)

## 2016-03-04 LAB — PHOSPHORUS: Phosphorus: 5.8 MG/DL — ABNORMAL HIGH (ref 2.6–4.7)

## 2016-03-04 MED ORDER — SODIUM CHLORIDE 0.9 % IJ SYRG
Freq: Three times a day (TID) | INTRAMUSCULAR | Status: AC
Start: 2016-03-04 — End: 2016-03-04
  Administered 2016-03-04: 21:00:00 via INTRAVENOUS

## 2016-03-04 MED ORDER — EPINEPHRINE 0.1 MG/ML SYRINGE
0.1 mg/mL | Freq: Once | INTRAMUSCULAR | Status: DC | PRN
Start: 2016-03-04 — End: 2016-03-04

## 2016-03-04 MED ORDER — SODIUM CHLORIDE 0.9 % IV
INTRAVENOUS | Status: DC
Start: 2016-03-04 — End: 2016-03-07
  Administered 2016-03-04: 19:00:00 via INTRAVENOUS

## 2016-03-04 MED ORDER — SODIUM CHLORIDE 0.9 % IJ SYRG
INTRAMUSCULAR | Status: AC | PRN
Start: 2016-03-04 — End: 2016-03-04

## 2016-03-04 MED ORDER — PROPOFOL 10 MG/ML IV EMUL
10 mg/mL | INTRAVENOUS | Status: DC | PRN
Start: 2016-03-04 — End: 2016-03-04
  Administered 2016-03-04: 20:00:00 via INTRAVENOUS

## 2016-03-04 MED ORDER — FLUMAZENIL 0.1 MG/ML IV SOLN
0.1 mg/mL | INTRAVENOUS | Status: DC | PRN
Start: 2016-03-04 — End: 2016-03-04

## 2016-03-04 MED ORDER — LIDOCAINE (PF) 20 MG/ML (2 %) IJ SOLN
20 mg/mL (2 %) | INTRAMUSCULAR | Status: DC | PRN
Start: 2016-03-04 — End: 2016-03-04
  Administered 2016-03-04: 20:00:00 via INTRAVENOUS

## 2016-03-04 MED ORDER — NALOXONE 0.4 MG/ML INJECTION
0.4 mg/mL | INTRAMUSCULAR | Status: DC | PRN
Start: 2016-03-04 — End: 2016-03-04

## 2016-03-04 MED ORDER — SIMETHICONE 40 MG/0.6 ML ORAL DROPS, SUSP
40 mg/0.6 mL | ORAL | Status: DC | PRN
Start: 2016-03-04 — End: 2016-03-04

## 2016-03-04 MED ORDER — ATROPINE 0.1 MG/ML SYRINGE
0.1 mg/mL | Freq: Once | INTRAMUSCULAR | Status: DC | PRN
Start: 2016-03-04 — End: 2016-03-04

## 2016-03-04 MED ORDER — INV-FECAL BIO-MATTER TRANSPLANT FMP250
Freq: Once | Status: AC
Start: 2016-03-04 — End: 2016-03-05

## 2016-03-04 MED FILL — INV-FECAL BIO-MATTER TRANSPLANT FMP250: Qty: 1

## 2016-03-04 MED FILL — VANCOMYCIN ORAL SOLUTION 50 MG/ML CPD (RX COMPOUNDED): 50 mg/mL | ORAL | Qty: 5

## 2016-03-04 MED FILL — CEFTRIAXONE 2 GRAM SOLUTION FOR INJECTION: 2 gram | INTRAMUSCULAR | Qty: 2

## 2016-03-04 MED FILL — BD POSIFLUSH NORMAL SALINE 0.9 % INJECTION SYRINGE: INTRAMUSCULAR | Qty: 10

## 2016-03-04 MED FILL — RENAGEL 800 MG TABLET: 800 mg | ORAL | Qty: 1

## 2016-03-04 MED FILL — LIDOCAINE (PF) 20 MG/ML (2 %) IJ SOLN: 20 mg/mL (2 %) | INTRAMUSCULAR | Qty: 5

## 2016-03-04 MED FILL — DOXERCALCIFEROL 4 MCG/2 ML IV SOLN: 4 mcg/2 mL | INTRAVENOUS | Qty: 2

## 2016-03-04 MED FILL — HYDROMORPHONE (PF) 1 MG/ML IJ SOLN: 1 mg/mL | INTRAMUSCULAR | Qty: 2

## 2016-03-04 MED FILL — CLONIDINE 0.1 MG TAB: 0.1 mg | ORAL | Qty: 1

## 2016-03-04 MED FILL — CARVEDILOL 12.5 MG TAB: 12.5 mg | ORAL | Qty: 1

## 2016-03-04 MED FILL — DIPRIVAN 10 MG/ML INTRAVENOUS EMULSION: 10 mg/mL | INTRAVENOUS | Qty: 20

## 2016-03-04 MED FILL — VELTASSA 8.4 GRAM ORAL POWDER PACKET: 8.4 gram | ORAL | Qty: 1

## 2016-03-04 MED FILL — SENSIPAR 30 MG TABLET: 30 mg | ORAL | Qty: 1

## 2016-03-04 MED FILL — AMLODIPINE 5 MG TAB: 5 mg | ORAL | Qty: 2

## 2016-03-04 MED FILL — SODIUM CHLORIDE 0.9 % IV: INTRAVENOUS | Qty: 1000

## 2016-03-04 NOTE — Other (Signed)
Glasses returned to patient

## 2016-03-04 NOTE — Other (Signed)
Anesthesia reports 30mg  Propofol, 40mg  Lidocaine and 50mL NS given during procedure.  Received report from anesthesia staff on vital signs and status of patient.

## 2016-03-04 NOTE — Other (Signed)
[facility] Team [facility]  [phone]    Vitals   Pre   Post   Assessment   Pre   Post     Temp  Temp: 98.3 ??F (36.8 ??C) (03/04/16 0531)  97.8 LOC  AAOX3 aaox3   HR   Pulse (Heart Rate): 91 (03/04/16 0531) 102 Lungs   CTA  CTA   B/P   BP: (!) 180/103 (03/04/16 0531) 185/109 Cardiac   Denies CP.  HTN noted  no change   Resp   Resp Rate: 16 (03/04/16 0531) 14 Skin   Warm/dry  warm/dry   Pain level  8/10 10/10 Edema  +4 LUE  +3 RUE  +3 BLE     No change.    Orders:    Duration:   Start:   0531 End:   0855 Total:   3.25hr   Dialyzer:   Revaclear (lot [id number]) Nipro tubing (lot [id number])   K Bath:   Dialysate K (mEq/L): 2 (03/04/16 0531)   Ca Bath:   Dialysate CA (mEq/L): 2.5 (03/04/16 0531)   Na/Bicarb:   Dialysate NA (mEq/L): 140 (03/04/16 0531)   Target Fluid Removal:   Goal/Amount of Fluid to Remove (mL): 2500 mL (03/04/16 0531)   Access     [facility]   Labs     Obtained/Reviewed   Critical Results Called   Date when labs were drawn-  Hgb-    HGB   Date Value Ref Range Status   03/02/2016 8.0 (L) 12.1 - 17.0 g/dL Final     K-    Potassium   Date Value Ref Range Status   03/02/2016 5.5 (H) 3.5 - 5.1 mmol/L Final     Ca-   Calcium   Date Value Ref Range Status   03/02/2016 6.6 (L) 8.5 - 10.1 MG/DL Final   29/56/213011/14/2017 6.6 (L) 8.5 - 10.1 MG/DL Final     Bun-   BUN   Date Value Ref Range Status   03/02/2016 43 (H) 6 - 20 MG/DL Final     Creat-   Creatinine   Date Value Ref Range Status   03/02/2016 8.86 (H) 0.70 - 1.30 MG/DL Final        Medications/ Blood Products Given     Name   Dose   Route and Time     hecterol 2mcg Iv 0731             Blood Volume Processed (BVP):    81.6L Net Fluid   Removed:  2500ML   Comments   Time Out Done: yes per policy  Primary Nurse Rpt Pre: Reinaldo Meeker Lewis, RN  Primary Nurse Rpt Post:  Pt Education: access care, K levels, fluid management  Care Plan: per primary team  Tx Summary: orders, labs, notes reviewed. Consent on file. LSC tunneled  dialysis catheter insertion site observed cdi. Drsg/biopatch in place (some peeling noted). 5ml waste blood aspirated x2 and both ports flushed without resistance. Pt tolerated 3.25hr hd tx for net uf 2500ml. During tx pt requested to come off tx because "I feel bad." agreed to continue tx provided he got his pain medication asap. Nurse provided iv prn med. Pt stated little relief from this and again requested to come off early. Agreed to do 10 more minutes for a total of 3hr 15min of tx time. Post tx all possible blood returned, both ports flushed, capped, clamped, wrapped per policy. Pt left in room in bed  in low position, side rails up x2, CB in reach. Pt is ambulatory. Pt in no acute distress post tx. Discussed pain issue with primary RN.    Consent signed - Informed Consent Verified: Yes (03/04/16 0531)  [facility] Consent - Yes  Hepatitis Status- neg (10/15/15); >10 (08/26/15)  Machine #- Machine Number: [id number] (03/04/16 0531)  Pre-dialysis wt.- Pre-Dialysis Weight: 113 kg (249 lb 1.9 oz) (03/04/16 0531)  Post dialysis wt.- 110.5kg    Signed: Jeannene PatellaStephen Hawkins, RN

## 2016-03-04 NOTE — Progress Notes (Signed)
TRANSFER - IN REPORT:    Verbal report received from Ankita, RN (name) on David [name]  being received from Med Tele room 3260 (unit) for ordered procedure      Report consisted of patient???s Situation, Background, Assessment and   Recommendations(SBAR).     Information from the following report(s) SBAR, Procedure Summary, Intake/Output, MAR and Recent Results was reviewed with the receiving nurse.    Opportunity for questions and clarification was provided.      Assessment completed upon patient???s arrival to unit and care assumed.     Will give report to primary care nurse upon arrival to unit.

## 2016-03-04 NOTE — Other (Signed)
TRANSFER - OUT REPORT:    Verbal report [facility] to [facility] on David [name]  being transferred to 3260(unit) for routine progression of care       Report consisted of patient???s Situation, Background, Assessment and   Recommendations(SBAR).     Information from the following report(s) Procedure Summary was reviewed with the receiving nurse.    Lines:   Peripheral IV 02/29/16 Right Other(comment) (Active)   Site Assessment Clean, dry, & intact 03/04/2016  8:18 AM   Phlebitis Assessment 0 03/04/2016  8:18 AM   Infiltration Assessment 0 03/04/2016  8:18 AM   Dressing Status Clean, dry, & intact 03/04/2016  8:18 AM   Dressing Type Transparent 03/04/2016  8:18 AM   Hub Color/Line Status Pink;Capped 03/04/2016  8:18 AM        Opportunity for questions and clarification was provided.      Patient transported with:   O2 @ 2 liters

## 2016-03-04 NOTE — Progress Notes (Signed)
Problem: Falls - Risk of  Goal: *Absence of Falls  Document Schmid Fall Risk and appropriate interventions in the flowsheet.   Outcome: Progressing Towards Goal  Fall Risk Interventions:            Medication Interventions: Patient to call before getting OOB, Teach patient to arise slowly         History of Falls Interventions: Door open when patient unattended

## 2016-03-04 NOTE — Progress Notes (Signed)
OK to proceed with scheduled surgical procedure tomorrow, assuming no acute changes in clinical status or lab derangements overnight and patient remains NPO overnight.

## 2016-03-04 NOTE — Other (Signed)
TRANSFER - IN REPORT:    Verbal report received from Ankita Rn on David [name]  being received from 3260(unit) for ordered procedure      Report consisted of patient???s Situation, Background, Assessment and   Recommendations(SBAR).     Information from the following report(s) SBAR and MAR was reviewed with the receiving nurse.    Opportunity for questions and clarification was provided.      Assessment completed upon patient???s arrival to unit and care assumed.

## 2016-03-04 NOTE — Progress Notes (Signed)
Dialysis notation: pt requested to come off early. Dr Mora Applickman notified. After the call pt agreed to stay on the tx if he can get his pain medication. Nurse notified.

## 2016-03-04 NOTE — Progress Notes (Signed)
Bedside shift change report given to [facility] L[facility] (Cabin crewoncoming nurse) by Morrie SheldonAshley (offgoing nurse). Report included the following information SBAR, Kardex, Procedure Summary, Intake/Output and MAR.    Zone Phone for oncoming shift:   [phone]    Shift Summary: Dilaudid for pain; fecal implant not done today; Enteric precautions    LDAs               Peripheral IV 02/29/16 Right Other(comment) (Active)   Site Assessment Clean, dry, & intact 03/04/2016  3:58 PM   Phlebitis Assessment 0 03/04/2016  3:58 PM   Infiltration Assessment 0 03/04/2016  3:58 PM   Dressing Status Clean, dry, & intact 03/04/2016  3:58 PM   Dressing Type Tape;Transparent 03/04/2016  3:58 PM   Hub Color/Line Status Pink;Capped 03/04/2016  3:58 PM        Hemodialysis Catheter (Active)   Central Line Being Utilized Yes 03/04/2016  3:58 PM   Criteria for Appropriate Use Dialysis/apheresis 03/04/2016  3:58 PM   Site Assessment Clean, dry, & intact 03/04/2016  8:18 AM   Date of Last Dressing Change 03/01/16 03/03/2016  4:27 PM   Dressing Status Clean, dry, & intact 03/04/2016  3:58 PM   Dressing Type Disk with Chlorhexadine gluconate (CHG) 03/04/2016  3:58 PM   Proximal Hub Color/Line Status Blue 03/04/2016  3:58 PM   Distal Hub Color/Line Status Red 03/04/2016  3:58 PM                    Intake & Output   Date 03/03/16 1900 - 03/04/16 0659 03/04/16 0700 - 03/05/16 0659   Shift 1900-0659 24 Hour Total 0700-1859 1900-0659 24 Hour Total   I  N  T  A  K  E   P.O. 500 500         P.O. 500 500       I.V.  (mL/kg/hr)   100  (0.1)  100      I.V.   50  50      Volume (0.9% sodium chloride infusion)   50  50    Shift Total  (mL/kg) 500  (4.4) 500  (4.4) 100  (0.9)  100  (0.9)   O  U  T  P  U  T   Urine  (mL/kg/hr) 0 0         Urine Voided 0 0         Urine Occurrence(s) 0 x 0 x       Emesis/NG output 0 0         Emesis 0 0         Emesis Occurrence(s) 0 x 0 x       Other 0 0         Other Output 0 0       Stool 0 0         Stool Occurrence(s) 0 x 0 x 7 x  7 x       Stool 0 0       Blood 0 0         Blood 0 0       Dialysis   2500  2500      NET Fluid Removed (mL)   2500  2500    Shift Total  (mL/kg) 0  (0) 0  (0) 2500  (22.2)  2500  (22.2)   NET 500 500 -2400  -2400   Weight (kg) 112.8 112.8 112.8 112.8 112.8  Last Bowel Movement Last Bowel Movement Date: 03/04/16   Glucose Checks []  N/A  []  AC/HS  []  Q6  Concerns:   Nutrition Active Orders   Diet    DIET RENAL Regular       Consults [] PT  [] OT  [] Speech  [] Case Management   Cardiac Monitoring [] N/A [] Yes Expires:

## 2016-03-04 NOTE — H&P (Signed)
Date of Surgery Update:  Laqueta Lindenroy D Franchini was seen and examined.  History and physical has been reviewed. The patient has been examined. There have been no significant clinical changes since the completion of the originally dated History and Physical.    Signed By: Sandy SalaamAlex R Temara Lanum, MD     March 04, 2016 2:38 PM

## 2016-03-04 NOTE — Progress Notes (Signed)
The pt [place] to [place] [facility] [place] [facility] [place]  [place] the same nephrologist who has denied the pt at both locations.  [place].  The pt is aware of the above

## 2016-03-04 NOTE — Anesthesia Pre-Procedure Evaluation (Addendum)
Anesthetic History   No history of anesthetic complications            Review of Systems / Medical History  Patient summary reviewed, nursing notes reviewed and pertinent labs reviewed    Pulmonary        Sleep apnea: No treatment  Smoker  Asthma : well controlled    Comments: 2-3 L oxygen at home   Neuro/Psych   Within defined limits           Cardiovascular    Hypertension              Exercise tolerance: >4 METS     GI/Hepatic/Renal         Renal disease: ESRD      Comments: C. difficile colitis  Endo/Other        Obesity and anemia     Other Findings   Comments: Ascites         Physical Exam    Airway  Mallampati: III  TM Distance: > 6 cm  Neck ROM: normal range of motion   Mouth opening: Normal     Cardiovascular  Regular rate and rhythm,  S1 and S2 normal,  no murmur, click, rub, or gallop             Dental      Comments: Missing several teeth, none loose, gap between top front teeth   Pulmonary  Breath sounds clear to auscultation               Abdominal  GI exam deferred       Other Findings            Anesthetic Plan    ASA: 4  Anesthesia type: total IV anesthesia          Induction: Intravenous  Anesthetic plan and risks discussed with: Patient      K 4.3 preop

## 2016-03-04 NOTE — Anesthesia Post-Procedure Evaluation (Signed)
Post-Anesthesia Evaluation and Assessment  Patient: David [name] MRN: [id number]  SSN: [id number]    Date of Birth: 12-04-1970  Age: 45 y.o.  Sex: male       Cardiovascular Function/Vital Signs  Visit Vitals   ??? BP 129/75   ??? Pulse 100   ??? Temp 37.1 ??C (98.7 ??F)   ??? Resp 16   ??? Ht 6\' 1"  (1.854 m)   ??? Wt 112.8 kg (248 lb 10.9 oz)   ??? SpO2 100%   ??? BMI 32.81 kg/m2       Patient is status post total IV anesthesia anesthesia for Procedure(s):  COLONOSCOPY.    Nausea/Vomiting: None    Postoperative hydration reviewed and adequate.    Pain:  Pain Scale 1: Visual (03/04/16 1454)  Pain Intensity 1: 0 (03/04/16 1454)   Managed    Neurological Status:       At baseline    Mental Status and Level of Consciousness: Arousable    Pulmonary Status:   O2 Device: CO2 nasal cannula (03/04/16 1454)   Adequate oxygenation and airway patent    Complications related to anesthesia: None    Post-anesthesia assessment completed. No concerns    Signed By: Baker JanusShelby M Jaeven Wanzer, DO     March 04, 2016

## 2016-03-04 NOTE — Progress Notes (Addendum)
0930 Talked with dr Darlina Rumpfgolla regarding high blood pressure of pt, and was told to check bp after dialysis again

## 2016-03-04 NOTE — Progress Notes (Signed)
Paged dr Glee Arvinseamon that pt unable to complete golytley, waiting dr Glee Arvinseamon to call back  Given update to christine in endoscopy regarding golytley she will communicate to dr Glee Arvinseamon

## 2016-03-04 NOTE — Other (Signed)
Bedside and Verbal shift change report given to oncoming nurse by Ramonia Mcclaran RN (offgoing nurse).  Report given with SBAR, Kardex, MAR and Recent Results.

## 2016-03-04 NOTE — Progress Notes (Signed)
11:15 Talked with dr brown about the pt complain of pain and increase swelling in rt arm. Per Dr brown pt already been talked about the condition .

## 2016-03-04 NOTE — Progress Notes (Signed)
Infectious Disease Progress Note      IMPRESSION:   1. Recurrent C.Diff colitis  2. ESRD on HD. Dialysis access in chest since July , previous LUE. No h/o bacteremia  3. Recurrent ascites since January this year?etiolgy   4. Appreciate GI input/  recommendations       PLAN:   1. Pt for FMT today  2. No antibiotics after FMT for as long as possible  3. For Fluid AFB smear, culture, CEA, AFP, cytology with next fluid draw      Subjective:      Pt seen. Awaiting procedure for FMT. Reports 3 bowel movements today. Nurse states he has not had any per pt report    Review of Systems:  A comprehensive review of systems was negative except for that written in the History of Present Illness.        Objective:     Blood pressure (!) 152/93, pulse 96, temperature 98.5 ??F (36.9 ??C), resp. rate 15, height 6\' 1"  (1.854 m), weight 112.8 kg (248 lb 10.9 oz), SpO2 98 %.  Temp (24hrs), Avg:98 ??F (36.7 ??C), Min:97.7 ??F (36.5 ??C), Max:98.5 ??F (36.9 ??C)      Lines:  Peripheral IV:         HD access in chest    Physical Exam:   General:  Alert, cooperative, well developed, appears stated age   Eyes:  Sclera anicteric. Pupils equally round and reactive to light.   Mouth/Throat: Mucous membranes normal, oral pharynx clear   Neck: Supple   Lungs:   Clear to auscultation bilaterally, good effort   CV:  Regular rate and rhythm,no murmur, click, rub or gallop   Abdomen:   Soft, non-tender. bowel sounds normal. Mildly -distended   Extremities: No cyanosis or edema   Skin: Skin color, texture, turgor normal. no acute rash or lesions   Lymph nodes: Cervical and supraclavicular normal   Musculoskeletal: No swelling or deformity   Lines/Devices:  Intact, no erythema, drainage or tenderness   Psych: Alert and oriented, normal mood affect       Data Review:   CBC:   Recent Labs      03/03/16   0550  03/02/16   0550   WBC  2.7*  6.3   RBC  2.70*  2.76*   HGB  8.0*  8.0*   HCT  24.9*  25.5*   PLT  129*  116*     CMP:   Recent Labs      03/03/16    0550  03/02/16   0550   GLU  73  78   NA  137  140   K  5.7*  5.5*   CL  104  104   CO2  24  23   BUN  49*  43*   CREA  9.41*  8.86*   CA  6.7*  6.6*   6.6*   AGAP  9  13   BUCR  5*  5*       Studies:      Lab Results   Component Value Date/Time    Culture result: NO GROWTH 3 DAYS 03/01/2016 01:00 PM    Culture result:  02/29/2016 11:28 AM     NO ROUTINE ENTERIC PATHOGENS ISOLATED INCLUDING SALMONELLA, SHIGELLA, YERSINIA, VIBRIO OR SHIGA TOXIN PRODUCING E. COLI    Culture result: NO GROWTH 4 DAYS 02/29/2016 06:44 AM    Culture result:  01/15/2016 07:36 AM     NO  ROUTINE ENTERIC PATHOGENS ISOLATED INCLUDING SALMONELLA, SHIGELLA, YERSINIA, VIBRIO OR SHIGA TOXIN PRODUCING E. COLI    Culture result: NO GROWTH 4 DAYS 11/21/2015 09:32 PM          XR Results (most recent):    Results from Hospital Encounter encounter on 02/29/16   XR CHEST PORT   Narrative Clinical indication: Possible pneumonia. Chronic renal insufficiency and asthma.    Portable AP erect view of the chest is obtained, comparison November 12. There  is significant improvement in the bilateral infiltrates since the prior  examination. Heart size remains prominent with minimal interstitial prominence,  vascular stent and double lumen catheter unchanged.         Impression impression: Significant improvement in bilateral infiltrates.        Patient Active Problem List   Diagnosis Code   ??? Gastroenteritis K52.9   ??? Peritonitis (HCC) K65.9   ??? Diarrhea R19.7         ICD-10-CM ICD-9-CM    1. Acute hyperkalemia E87.5 276.7    2. Abdominal pain, generalized R10.84 789.07    3. Diarrhea, unspecified type R19.7 787.91    4. ESRD (end stage renal disease) (HCC) N18.6 585.6    5. Pleural effusion J90 511.9    6. Hypocalcemia E83.51 275.41    7. Elevated CPK R74.8 790.5        I have discussed the diagnosis with the patient and the intended plan as seen in the above orders.     I have discussed medication side effects and warnings with the patient as well.     Reviewed test results at length with patient    Anti-infectives:     Vancomycin Po- Dc     Richardean Chimeraiane S Avionna Bower, MD Jerrel IvoryFACP

## 2016-03-04 NOTE — Procedures (Signed)
NAME:  David [name]   DOB:   October 12, 1970   MRN:   [id number]     Date/Time:  03/04/2016 2:38 PM  Sigmomidoscopy Operative Report    Procedure Type:  Sigmoidoscopy diagnostic    Indications:   Pre-operative Diagnosis: see indication above  Post-operative Diagnosis:  See findings below  Operator:  Lorine BearsAlex Eeva Schlosser, MD  PRIMARY GI: Dr. Joseph PieriniM. Farrell  Referring Provider: -Dr. Darlina RumpfGolla    Exam:  Airway: clear, no airway problems anticipated  Heart: RRR, without gallops or rubs  Lungs: clear bilaterally without wheezes, crackles, or rhonchi  Abdomen: soft, nontender, nondistended, bowel sounds present  Mental Status: awake, alert and oriented to person, place and time    Sedation:  MAC anesthesia Propofol  Procedure Details:  After informed consent was obtained with all risks and benefits of procedure explained and preoperative exam completed, the patient was taken to the endoscopy suite and placed in the left lateral decubitus position.  Upon sequential sedation as per above, a digital rectal exam was performed demonstrating internal and external hemorrhoids.  The Olympus videocolonoscope  was inserted in the rectum and carefully advanced to the rectum.   The quality of preparation was poor.  The colonoscope was slowly withdrawn with careful evaluation between folds. Retroflexion in the rectum was completed demonstrating internal and external hemorrhoids.     Findings:   ANUS: Anal exam reveals no masses or hemorrhoids, sphincter tone is normal.   RECTUM: Rectal exam reveals solid, thick brown stool in entire lumen without any pseudomembranes or signs of c.diff. Colonic mucosa was completely healthy and normal. Scope could not be advanced through solid stool, so procedure aborted.    Specimen Removed:  none  Complications:  None.   EBL:     None.    Impression:    -- solid, completely formed stool filled the rectal lumen. Procedure aborted.   -- no mucosal features of c.diff infection. Mucosa is completely normal  and no pseudomembranes  -- hard to believe this is pathogenic c.diff in this scenario, and if it is, I would would think it should respond to antibiotics  -- his medical noncompliance should be noted: patient only drank half of the bowel preparation this time. [facility] as he refused to comply with bowel preparation or hospital recommendations there for a planned fecal microbiota transplant there (scheduled for Monday). Compliance with previous antibiotics for clearance of c.diff should also be questioned.    Recommendations:   -- resume diet  -- abx per ID    Discharge Disposition:  Back to wards    Sandy SalaamAlex R Sadrac Zeoli, MD

## 2016-03-04 NOTE — Progress Notes (Signed)
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 )  -Pt with abdominal pain, nausea, vomiting, and decreased PO intake on presentation   -c diff is positive   - on enteric precautions  -Last abx use was last month and he supposedly received Flagyl and Vanco  -FMT was aborted today due to incomplete colon prep and there were no pseudomembranes GI questioned the need for the procedure give his compliance to medications and colon prep  -HIV pending       ESRD on HD MWF for 7 years, missed 11/10 session  Hyperkalemia  LUE swelling  - no peaked T waves on EKG  -Given hyperkalemia cocktail by ED  -AVF LUE is not working as per the pt, which is why he has a L chest catheter  -LUE US pending shows patent veins and being evaluated by Dr Manson PasseyBrown  -Dr Manson PasseyBrown has given options for the pt as below for management of left arm swelling    1. Ligate LUE AVF which will resolve arm swelling but leave him with only catheter access.   ????2. Begin to cannulate AVF again (no one has tried since July) and if successful remove catheter and treat central stenosis with balloon angioplasty at same time.  -pt will decide on above options and will inform Dr Manson PasseyBrown.    Peritoneal fluid cell count 238 with concern for peritonitis  Will stop ceftriaxone after today's dose based on recommendations from ID and renal.    Suspected viral URTI  -lung exam is benign.  -no fever or leucocytosis  -CXR shows improvement of pulmonary infilterates    Hypertension, controlled  -BP stable   -on clonidine,coreg and norvasc.    Hx of Hepatitis B  -needs out pt f/u on discharge    Recurrent Ascites unclear cause work up underway  -CT AP noted, no evidence of cirrhosis - pt denies EtOH use in the past 10 years   -States that he has been worked up for this in the past and it is not because of cirrhosis   -SAAG is less than  1.1 so protal HTN is unlikely and Lft's are normal   -Echo shows normal EF and no diastolic dysfunction. He does not make any urine to look for urine protein  -will get records and so far we have requested multiple times but did not get any so far.   -palliative care following.  -will need additional ascitis fluid studies sent with next tap including  Fluid AFB smear, culture, CEA, AFP, cytology with next fluid draw       Body mass index is 32.81 kg/(m^2).    Code status: Full  Prophylaxis: SCD  Recommended Disposition: Home w/Family     Subjective:     Chief Complaint / Reason for Physician Visit  FMT procedure was aborted due to hard stool and in complete colon prep.  Reports having 3 loose stools today but per nursing no bowel moments.    Review of Systems:  Symptom Y/N Comments  Symptom Y/N Comments   Fever/Chills    Chest Pain n    Poor Appetite n   Edema     Cough    Abdominal Pain y    Sputum    Joint Pain     SOB/DOE n   Pruritis/Rash  Nausea/vomit n   Tolerating PT/OT y    Diarrhea y   Tolerating Diet y    Constipation n   Other       Could NOT obtain due to:      Objective:     VITALS:   Last 24hrs VS reviewed since prior progress note. Most recent are:  Patient Vitals for the past 24 hrs:   Temp Pulse Resp BP SpO2   03/04/16 1527 98.2 ??F (36.8 ??C) 91 16 (!) 173/103 99 %   03/04/16 1512 - 95 20 144/72 100 %   03/04/16 1510 - 94 14 144/72 100 %   03/04/16 1505 - 93 24 141/80 100 %   03/04/16 1500 - 97 14 131/73 100 %   03/04/16 1455 - 92 16 129/75 100 %   03/04/16 1454 98.7 ??F (37.1 ??C) 100 - 129/75 100 %   03/04/16 1450 - 93 16 145/63 100 %   03/04/16 1411 - - - (!) 134/100 -   03/04/16 1408 - 97 16 - 100 %   03/04/16 1122 98.5 ??F (36.9 ??C) 96 15 (!) 152/93 98 %   03/04/16 0900 - (!) 102 14 (!) 185/109 -   03/04/16 0830 - (!) 103 - (!) 190/104 -   03/04/16 0800 - (!) 108 - (!) 193/110 -   03/04/16 0731 97.8 ??F (36.6 ??C) (!) 105 18 (!) 181/103 100 %    03/04/16 0700 - (!) 105 - (!) 197/103 -   03/04/16 0630 - 100 - 165/82 -   03/04/16 0600 - 94 - (!) 163/112 -   03/04/16 0531 98.3 ??F (36.8 ??C) 91 16 (!) 180/103 -   03/04/16 0520 - 93 20 (!) 175/105 -   03/04/16 0046 97.9 ??F (36.6 ??C) 96 18 (!) 176/115 95 %   03/03/16 1629 97.7 ??F (36.5 ??C) 86 18 (!) 155/97 96 %       Intake/Output Summary (Last 24 hours) at 03/04/16 1621  Last data filed at 03/04/16 1500   Gross per 24 hour   Intake              600 ml   Output             2500 ml   Net            -1900 ml        PHYSICAL EXAM:  General: cooperative, no acute distress????  EENT:  EOMI. Anicteric sclerae. MMM  Resp:  B/l air entry +, basal crackles +  CV:  Regular  rhythm,?? No edema  GI:  Soft, non tender, distended, BS+, dressing + over paracentesis site   Neurologic:?? Alert and oriented X 3, normal speech, no focal deficits  Psych:???? Fair insight.??Not anxious nor agitated  Skin:  No rashes.  No jaundice.  MSK:  LUE swelling, radial pulse +     Reviewed most current lab test results and cultures  YES  Reviewed most current radiology test results   YES  Review and summation of old records today    NO  Reviewed patient's current orders and MAR    YES  PMH/SH reviewed - no change compared to H&P      Current Facility-Administered Medications:   ???  sodium chloride (NS) flush 5-10 mL, 5-10 mL, IntraVENous, PRN, Lamar SprinklesJonathan G Gaspar, MD  ???  0.9% sodium chloride infusion, 75 mL/hr, IntraVENous, CONTINUOUS, Lamar SprinklesJonathan G Gaspar, MD, Stopped at 03/04/16 1501  ???  INV-FECAL BIO-MATTER TRANSPLANT-FMP250,  250 mL, Rectal, ONCE, Sandy Salaam, MD  ???  albuterol (PROVENTIL VENTOLIN) nebulizer solution 2.5 mg, 2.5 mg, Nebulization, Q4H PRN, Tanja Port, MD  ???  epoetin alfa (EPOGEN;PROCRIT) injection 10,000 Units, 10,000 Units, SubCUTAneous, Q MON, WED & FRI, Gary Fleet, MD  ???  cinacalcet (SENSIPAR) tablet 30 mg, 30 mg, Oral, DAILY WITH BREAKFAST, Gary Fleet, MD, 30 mg at 03/04/16 1200   ???  doxercalciferol (HECTOROL) 4 mcg/2 mL injection 2 mcg, 2 mcg, IntraVENous, DIALYSIS MON, WED & FRI, Gary Fleet, MD, 2 mcg at 03/04/16 0731  ???  patiromer calcium sorbitex (VELTASSA) powder 8.4 g, 8.4 g, Oral, DAILY, Gary Fleet, MD, Stopped at 03/03/16 0900  ???  albuterol (PROVENTIL HFA, VENTOLIN HFA, PROAIR HFA) inhaler 2 Puff, 2 Puff, Inhalation, Q6H PRN, Virl Diamond, MD, 2 Puff at 03/01/16 1817  ???  cloNIDine HCl (CATAPRES) tablet 0.1 mg, 0.1 mg, Oral, Q12H, Gary Fleet, MD, 0.1 mg at 03/04/16 1200  ???  sodium chloride (NS) flush 5-10 mL, 5-10 mL, IntraVENous, PRN, Edward Jolly. Providence Lanius, MD, 10 mL at 03/03/16 1925  ???  amLODIPine (NORVASC) tablet 10 mg, 10 mg, Oral, DAILY, Earvin Hansen, MD, 10 mg at 03/04/16 1200  ???  carvedilol (COREG) tablet 12.5 mg, 12.5 mg, Oral, BID WITH MEALS, Earvin Hansen, MD, 12.5 mg at 03/04/16 1200  ???  sevelamer (RENAGEL) tablet 800 mg, 800 mg, Oral, TID WITH MEALS, Earvin Hansen, MD, 800 mg at 03/04/16 1159  ???  sodium chloride (NS) flush 5-10 mL, 5-10 mL, IntraVENous, Q8H, Earvin Hansen, MD, 10 mL at 03/04/16 1601  ???  ondansetron (ZOFRAN) injection 4 mg, 4 mg, IntraVENous, Q4H PRN, Earvin Hansen, MD, 4 mg at 03/02/16 1817  ???  HYDROmorphone (PF) (DILAUDID) injection 2 mg, 2 mg, IntraVENous, Q3H PRN, Virl Diamond, MD, 2 mg at 03/04/16 1600    ________________________________________________________________________  Care Plan discussed with:    Comments   Patient y    Family      RN y    Care Manager     Consultant  y Dr Shella Maxim                     Multidiciplinary team rounds were held today with case manager, nursing, pharmacist and clinical coordinator.  Patient's plan of care was discussed; medications were reviewed and discharge planning was addressed.     ________________________________________________________________________  Total NON critical care TIME:  25   Minutes    Total CRITICAL CARE TIME Spent:   Minutes non procedure based      Comments    >50% of visit spent in counseling and coordination of care y    ________________________________________________________________________  Vena Austria, MD     Procedures: see electronic medical records for all procedures/Xrays and details which were not copied into this note but were reviewed prior to creation of Plan.      LABS:  I reviewed today's most current labs and imaging studies.  Pertinent labs include:  Recent Labs      03/03/16   0550  03/02/16   0550   WBC  2.7*  6.3   HGB  8.0*  8.0*   HCT  24.9*  25.5*   PLT  129*  116*     Recent Labs      03/03/16   0550  03/02/16   0550   NA  137  140  K  5.7*  5.5*   CL  104  104   CO2  24  23   GLU  73  78   BUN  49*  43*   CREA  9.41*  8.86*   CA  6.7*  6.6*   6.6*   MG  2.0  1.8   PHOS  5.8*  5.1*       Signed: Vena Austria, MD

## 2016-03-04 NOTE — Other (Signed)
Endoscope was pre-cleaned at the bedside immediately following procedure by Stephanie Hazelgrove.

## 2016-03-04 NOTE — Progress Notes (Signed)
Patient still in endoscopy, will defer palliative assessment for tomorrow when patient is not sedated and is back in his room.

## 2016-03-04 NOTE — Progress Notes (Signed)
Bedside shift change report given to Morrie SheldonAshley (Cabin crewoncoming nurse) by Earmon PhoenixAnkita (offgoing nurse). Report included the following information SBAR, Kardex, OR Summary, Procedure Summary, Intake/Output and Recent Results.

## 2016-03-04 NOTE — Procedures (Signed)
Procedures  by Sandy SalaamSeamon, Usiel Astarita R, MD at 03/04/16 1438                Author: Sandy SalaamSeamon, Morgin Halls R, MD  Service: Gastroenterology  Author Type: Physician       Filed: 03/04/16 1512  Date of Service: 03/04/16 1438  Status: Signed          Editor: Sandy SalaamSeamon, Nakeeta Sebastiani R, MD (Physician)            Pre-procedure Diagnoses        1. 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 Constipation, unspecified constipation type [K59.00]                           Procedures        1. [facility] [[id number]][[id number]]                                      NAME:  David [name]    DOB:   [birth year]    MRN:   [id number]       Date/Time:   03/04/2016 2:38 PM      Sigmomidoscopy Operative Report      Procedure Type:  Sigmoidoscopy diagnostic      Indications:    Pre-operative Diagnosis: see indication above   Post-operative Diagnosis:  See findings below   Operator:  Lorine BearsAlex Alexias Margerum, MD   PRIMARY GI: Dr. Joseph PieriniM. Farrell   Referring Provider: -Dr. Darlina RumpfGolla      Exam:   Airway: clear, no airway problems anticipated   Heart: RRR, without gallops or rubs   Lungs: clear bilaterally without wheezes, crackles, or rhonchi   Abdomen: soft, nontender, nondistended, bowel sounds present   Mental Status: awake, alert and oriented to person, place and time      Sedation:  MAC anesthesia Propofol   Procedure Details:  After informed consent was obtained with all risks and benefits of procedure explained and preoperative exam completed,  the patient was taken to the endoscopy suite and placed in the left lateral decubitus position.  Upon sequential sedation as per above, a digital rectal exam was performed demonstrating internal  and external hemorrhoids.  The Olympus videocolonoscope  was inserted in the rectum and carefully advanced to the rectum.   The quality of preparation was poor.  The colonoscope was slowly withdrawn  with careful evaluation between folds. Retroflexion in the rectum was  completed demonstrating internal and external hemorrhoids.       Findings:    ANUS: Anal exam reveals no masses or hemorrhoids, sphincter tone is normal.    RECTUM: Rectal exam reveals solid, thick brown stool in entire lumen without any pseudomembranes or signs of c.diff. Colonic mucosa was completely  healthy and normal. Scope could not be advanced through solid stool, so procedure aborted.      Specimen Removed:  none   Complications:  None.    EBL:     None.      Impression:     -- solid, completely formed stool filled the rectal lumen. Procedure aborted.    -- no mucosal features of c.diff infection. Mucosa is completely  normal and no pseudomembranes   --  hard to believe this is pathogenic c.diff in this scenario,  and if it is, I would would think it should respond to antibiotics   -- his medical noncompliance should be noted: patient only drank half of the bowel preparation this time. [facility] Saturday as he refused to comply with bowel preparation or hospital recommendations there for a planned  fecal microbiota transplant there (scheduled for Monday). Compliance with previous antibiotics for clearance of c.diff should also be questioned.      Recommendations:    -- resume diet   -- abx per ID      Discharge Disposition:  Back to wards      Sandy SalaamAlex R Kimberlyn Quiocho, MD

## 2016-03-05 LAB — CULTURE, BLOOD, PAIRED: Culture result:: NO GROWTH

## 2016-03-05 LAB — CULTURE, BODY FLUID W GRAM STAIN
Culture result:: NO GROWTH
GRAM STAIN: NONE SEEN

## 2016-03-05 LAB — HIV 1/2 AG/AB, 4TH GENERATION,W RFLX CONFIRM: HIV 1/2 Interpretation: NONREACTIVE

## 2016-03-05 LAB — HIV 1/2 ANTIGEN/ANTIBODY, FOURTH GENERATION W/RFL: Interpretation: NONREACTIVE

## 2016-03-05 MED ORDER — HYDROMORPHONE 2 MG TAB
2 mg | ORAL_TABLET | Freq: Four times a day (QID) | ORAL | 0 refills | Status: AC | PRN
Start: 2016-03-05 — End: 2016-03-10

## 2016-03-05 MED ORDER — VANCOMYCIN ORAL SOLUTION 50 MG/ML CPD (RX COMPOUNDED)
50 mg/mL | Freq: Every day | ORAL | Status: DC
Start: 2016-03-05 — End: 2016-03-10

## 2016-03-05 MED ORDER — VANCOMYCIN ORAL SOLUTION 50 MG/ML CPD (RX COMPOUNDED)
50 mg/mL | ORAL | 0 refills | Status: AC
Start: 2016-03-05 — End: 2016-04-02

## 2016-03-05 MED ORDER — VANCOMYCIN ORAL SOLUTION 50 MG/ML CPD (RX COMPOUNDED)
50 mg/mL | ORAL | Status: DC
Start: 2016-03-05 — End: 2016-03-10

## 2016-03-05 MED ORDER — VANCOMYCIN ORAL SOLUTION 50 MG/ML CPD (RX COMPOUNDED)
50 mg/mL | ORAL | 0 refills | Status: AC
Start: 2016-03-05 — End: 2016-04-15

## 2016-03-05 MED ORDER — CLONIDINE 0.1 MG TAB
0.1 mg | ORAL_TABLET | Freq: Two times a day (BID) | ORAL | 0 refills | Status: AC
Start: 2016-03-05 — End: 2016-04-04

## 2016-03-05 MED ORDER — L.ACIDOPH & PARACASEI-S.THERMOPHIL-BIFIDOBACTERIUM 8 BILLION CELL CAP
8 billion cell | Freq: Every day | ORAL | Status: DC
Start: 2016-03-05 — End: 2016-03-10
  Administered 2016-03-06 – 2016-03-09 (×4): via ORAL

## 2016-03-05 MED ORDER — THROMBIN (BOVINE) 5,000 UNIT TOPICAL SOLUTION
5000 unit | CUTANEOUS | Status: AC
Start: 2016-03-05 — End: ?

## 2016-03-05 MED ORDER — LIDOCAINE HCL 1 % (10 MG/ML) IJ SOLN
10 mg/mL (1 %) | INTRAMUSCULAR | Status: AC
Start: 2016-03-05 — End: ?

## 2016-03-05 MED ORDER — HEPARIN (PORCINE) 1,000 UNIT/ML IJ SOLN
1000 unit/mL | INTRAMUSCULAR | Status: AC
Start: 2016-03-05 — End: ?

## 2016-03-05 MED ORDER — BACITRACIN 50,000 UNIT IM
50000 unit | INTRAMUSCULAR | Status: AC
Start: 2016-03-05 — End: ?

## 2016-03-05 MED ORDER — VANCOMYCIN ORAL SOLUTION 50 MG/ML CPD (RX COMPOUNDED)
50 mg/mL | Freq: Four times a day (QID) | ORAL | Status: DC
Start: 2016-03-05 — End: 2016-03-10
  Administered 2016-03-06 – 2016-03-10 (×15): via ORAL

## 2016-03-05 MED ORDER — PSYLLIUM HUSK (ASPARTAME) 3.4 GRAM ORAL POWDER PACKET
3.4 gram | Freq: Two times a day (BID) | ORAL | Status: DC
Start: 2016-03-05 — End: 2016-03-10
  Administered 2016-03-05 – 2016-03-09 (×7): via ORAL

## 2016-03-05 MED ORDER — SODIUM CHLORIDE 0.9 % IJ SYRG
INTRAMUSCULAR | Status: AC
Start: 2016-03-05 — End: ?

## 2016-03-05 MED ORDER — EPOETIN ALFA 10,000 UNIT/ML IJ SOLN
10000 unit/mL | INTRAMUSCULAR | 0 refills | Status: AC
Start: 2016-03-05 — End: 2016-04-04

## 2016-03-05 MED ORDER — VANCOMYCIN ORAL SOLUTION 50 MG/ML CPD (RX COMPOUNDED)
50 mg/mL | Freq: Every day | ORAL | 0 refills | Status: AC
Start: 2016-03-05 — End: 2016-03-26

## 2016-03-05 MED ORDER — VANCOMYCIN ORAL SOLUTION 50 MG/ML CPD (RX COMPOUNDED)
50 mg/mL | Freq: Four times a day (QID) | ORAL | 0 refills | Status: AC
Start: 2016-03-05 — End: 2016-03-12

## 2016-03-05 MED ORDER — VANCOMYCIN ORAL SOLUTION 50 MG/ML CPD (RX COMPOUNDED)
50 mg/mL | Freq: Two times a day (BID) | ORAL | Status: DC
Start: 2016-03-05 — End: 2016-03-10

## 2016-03-05 MED ORDER — VANCOMYCIN ORAL SOLUTION 50 MG/ML CPD (RX COMPOUNDED)
50 mg/mL | Freq: Two times a day (BID) | ORAL | 0 refills | Status: AC
Start: 2016-03-05 — End: 2016-03-19

## 2016-03-05 MED ORDER — HEPARIN (PORCINE) 5,000 UNIT/ML IJ SOLN
5000 unit/mL | INTRAMUSCULAR | Status: AC
Start: 2016-03-05 — End: ?

## 2016-03-05 MED ORDER — IOTHALAMATE MEGLUMINE 60 % INJECTION
60 % | INTRAMUSCULAR | Status: AC
Start: 2016-03-05 — End: ?

## 2016-03-05 MED ORDER — CINACALCET 30 MG TAB
30 mg | ORAL_TABLET | Freq: Every day | ORAL | 2 refills | Status: DC
Start: 2016-03-05 — End: 2016-05-15

## 2016-03-05 MED ORDER — DOXERCALCIFEROL 4 MCG/2 ML IV SOLN
4 mcg/2 mL | INTRAVENOUS | 0 refills | Status: AC
Start: 2016-03-05 — End: 2016-04-04

## 2016-03-05 MED ORDER — ALBUTEROL SULFATE HFA 90 MCG/ACTUATION AEROSOL INHALER
90 mcg/actuation | Freq: Four times a day (QID) | RESPIRATORY_TRACT | 0 refills | Status: DC | PRN
Start: 2016-03-05 — End: 2016-03-05

## 2016-03-05 MED FILL — RENAGEL 800 MG TABLET: 800 mg | ORAL | Qty: 1

## 2016-03-05 MED FILL — HYDROMORPHONE (PF) 1 MG/ML IJ SOLN: 1 mg/mL | INTRAMUSCULAR | Qty: 2

## 2016-03-05 MED FILL — BD POSIFLUSH NORMAL SALINE 0.9 % INJECTION SYRINGE: INTRAMUSCULAR | Qty: 10

## 2016-03-05 MED FILL — SENSIPAR 30 MG TABLET: 30 mg | ORAL | Qty: 1

## 2016-03-05 MED FILL — HEPARIN (PORCINE) 5,000 UNIT/ML IJ SOLN: 5000 unit/mL | INTRAMUSCULAR | Qty: 1

## 2016-03-05 MED FILL — CLONIDINE 0.1 MG TAB: 0.1 mg | ORAL | Qty: 1

## 2016-03-05 MED FILL — PROCRIT 10,000 UNIT/ML INJECTION SOLUTION: 10000 unit/mL | INTRAMUSCULAR | Qty: 1

## 2016-03-05 MED FILL — HEPARIN (PORCINE) 1,000 UNIT/ML IJ SOLN: 1000 unit/mL | INTRAMUSCULAR | Qty: 2

## 2016-03-05 MED FILL — VELTASSA 8.4 GRAM ORAL POWDER PACKET: 8.4 gram | ORAL | Qty: 1

## 2016-03-05 MED FILL — ONDANSETRON (PF) 4 MG/2 ML INJECTION: 4 mg/2 mL | INTRAMUSCULAR | Qty: 2

## 2016-03-05 MED FILL — PSYLLIUM HUSK (ASPARTAME) 3.4 GRAM ORAL POWDER PACKET: 3.4 gram | ORAL | Qty: 1

## 2016-03-05 MED FILL — DOXERCALCIFEROL 4 MCG/2 ML IV SOLN: 4 mcg/2 mL | INTRAVENOUS | Qty: 2

## 2016-03-05 MED FILL — LIDOCAINE HCL 1 % (10 MG/ML) IJ SOLN: 10 mg/mL (1 %) | INTRAMUSCULAR | Qty: 20

## 2016-03-05 MED FILL — AMLODIPINE 5 MG TAB: 5 mg | ORAL | Qty: 2

## 2016-03-05 MED FILL — THROMBIN-JMI 5,000 UNIT TOPICAL SOLUTION: 5000 unit | CUTANEOUS | Qty: 5000

## 2016-03-05 MED FILL — CARVEDILOL 12.5 MG TAB: 12.5 mg | ORAL | Qty: 1

## 2016-03-05 MED FILL — BACITRACIN 50,000 UNIT IM: 50000 unit | INTRAMUSCULAR | Qty: 50000

## 2016-03-05 MED FILL — VANCOMYCIN ORAL SOLUTION 50 MG/ML CPD (RX COMPOUNDED): 50 mg/mL | ORAL | Qty: 5

## 2016-03-05 MED FILL — CONRAY 60 % INJECTION SOLUTION: 60 % | INTRAMUSCULAR | Qty: 150

## 2016-03-05 NOTE — Progress Notes (Addendum)
GI Progress Note David [name](David [name] for [facility])  NAME:David [name] DOB:[birth year] XBM:[id number]   PCP: None  Date/Time:  03/05/2016 8:06 AM   Assessment:   1. Recurrent C diff ?  2. Abdominal ascites which he reports has been worked up in past  3. ESRD on HD   4. Constipation     Plan:   ?? Abx per ID  ?? Agree with probiotics  ?? FMT indicated for refractory/recurrent CDI. Options include by UGI tract (i.e. ND/NJ tube) vs. LGI tract (via colonoscopy). Pt is adamantly against any tube placement and thus this would necessitate via colonoscopy route.   ?? Next time patient has a paracentesis would recommend sending for cytology and ADA. Given his ascites is not 2/2 portal hypertension would defer further w/u and management to Hospitalist/Nephrologist. ID recs also noted  ?? Pt does not need a liver biopsy as he reports he has already had one (which was reportedly normal) and again low SAAG goes against liver etiology  ?? Add BID metamucil  ?? GI will see on request this weekend     Subjective:   Reviewed colonoscopy findings, or rather, sigmoidoscopy findings. I also viewed his more than half full GoLytely and reminded him of how he left [facility] over his unwillingness to drink liquids and do the bowel prep. He's non-toxic, and his stools are firm. He has constipation. I told him he needs a fiber supplement, and we can try to treat C.diff presence with abx, although it does not seem to be harming his colon or its function.    Review of Systems:  Symptom Y/N Comments  Symptom Y/N Comments   Fever/Chills n   Chest Pain n    Cough n   Headaches n    Sputum n   Joint Pain n    SOB/DOE n   Pruritis/Rash n    Tolerating Diet y   Other       Could NOT obtain due to:      Objective:   VITALS:   Last 24hrs VS reviewed since prior progress note. Most recent are:  Visit Vitals   ??? BP (!) 148/93   ??? Pulse 99   ??? Temp 97.8 ??F (36.6 ??C)   ??? Resp 18   ??? Ht 6\' 1"  (1.854 m)   ??? Wt 112.8 kg (248 lb 10.9 oz)   ??? SpO2 95%    ??? BMI 32.81 kg/m2     No intake or output data in the 24 hours ending 03/05/16 1614  PHYSICAL EXAM:  General: WD, WN. Alert, cooperative, no acute distress????  HEENT: NC, Atraumatic.  PERRLA, EOMI. Anicteric sclerae.  Abdomen: Soft, Non distended, +diffuse TTP w/o peritoneal signs, +fluid wave ??+Bowel sounds, no HSM  Neurologic:?? No acute neurological distress   Psych:???? Unclear insight.??Not anxious nor agitated.    Lab and Radiology Data Reviewed: (see below)    Medications Reviewed: (see below)  PMH/SH reviewed - no change compared to H&P  ________________________________________________________________________    Care Plan discussed with:  Patient x   Family     RN               Consultant:       David SalaamAlex R Lynx Goodrich, MD     Procedures: see electronic medical records for all procedures/Xrays and details which were not copied into this note but were reviewed prior to creation of Plan.      LABS:  Recent Labs  03/03/16   0550   WBC  2.7*   HGB  8.0*   HCT  24.9*   PLT  129*     Recent Labs      03/03/16   0550   NA  137   K  5.7*   CL  104   CO2  24   BUN  49*   CREA  9.41*   GLU  73   CA  6.7*   MG  2.0   PHOS  5.8*     No results for input(s): SGOT, GPT, AP, TBIL, TP, ALB, GLOB, GGT, AML, LPSE in the last 72 hours.    No lab exists for component: AMYP, HLPSE  No results for input(s): INR, PTP, APTT in the last 72 hours.    No lab exists for component: INREXT, INREXT   No results for input(s): FE, TIBC, PSAT, FERR in the last 72 hours.   No results found for: FOL, RBCF  No results for input(s): PH, PCO2, PO2 in the last 72 hours.  No results for input(s): CPK, CKMB in the last 72 hours.    No lab exists for component: TROPONINI  No results found for: COLOR, APPRN, SPGRU, REFSG, PHU, PROTU, GLUCU, KETU, BILU, UROU, NITU, LEUKU, GLUKE, EPSU, BACTU, WBCU, RBCU, CASTS, UCRY    MEDICATIONS:  Current Facility-Administered Medications   Medication Dose Route Frequency    ??? psyllium husk-aspartame (METAMUCIL FIBER) packet 1 Packet  1 Packet Oral BID   ??? [START ON 03/06/2016] vancomycin 50 mg/mL oral solution (compounded) 250 mg  250 mg Oral QID   ??? [START ON 03/13/2016] vancomycin 50 mg/mL oral solution (compounded) 250 mg  250 mg Oral BID   ??? [START ON 03/20/2016] vancomycin 50 mg/mL oral solution (compounded) 125 mg  125 mg Oral DAILY   ??? [START ON 03/27/2016] vancomycin 50 mg/mL oral solution (compounded) 125 mg  125 mg Oral EVERY OTHER DAY   ??? [START ON 04/03/2016] vancomycin 50 mg/mL oral solution (compounded) 125 mg  125 mg Oral Q3DAYS   ??? [START ON 03/06/2016] lactobac ac& pc-s.therm-b.anim (FLORA Q/RISAQUAD)  1 Cap Oral DAILY   ??? 0.9% sodium chloride infusion  75 mL/hr IntraVENous CONTINUOUS   ??? albuterol (PROVENTIL VENTOLIN) nebulizer solution 2.5 mg  2.5 mg Nebulization Q4H PRN   ??? epoetin alfa (EPOGEN;PROCRIT) injection 10,000 Units  10,000 Units SubCUTAneous Q MON, WED & FRI   ??? cinacalcet (SENSIPAR) tablet 30 mg  30 mg Oral DAILY WITH BREAKFAST   ??? doxercalciferol (HECTOROL) 4 mcg/2 mL injection 2 mcg  2 mcg IntraVENous DIALYSIS MON, WED & FRI   ??? patiromer calcium sorbitex (VELTASSA) powder 8.4 g  8.4 g Oral DAILY   ??? albuterol (PROVENTIL HFA, VENTOLIN HFA, PROAIR HFA) inhaler 2 Puff  2 Puff Inhalation Q6H PRN   ??? cloNIDine HCl (CATAPRES) tablet 0.1 mg  0.1 mg Oral Q12H   ??? sodium chloride (NS) flush 5-10 mL  5-10 mL IntraVENous PRN   ??? amLODIPine (NORVASC) tablet 10 mg  10 mg Oral DAILY   ??? carvedilol (COREG) tablet 12.5 mg  12.5 mg Oral BID WITH MEALS   ??? sevelamer (RENAGEL) tablet 800 mg  800 mg Oral TID WITH MEALS   ??? sodium chloride (NS) flush 5-10 mL  5-10 mL IntraVENous Q8H   ??? ondansetron (ZOFRAN) injection 4 mg  4 mg IntraVENous Q4H PRN   ??? HYDROmorphone (PF) (DILAUDID) injection 2 mg  2 mg IntraVENous Q3H PRN

## 2016-03-05 NOTE — Progress Notes (Addendum)
Progress Note    NAME: David [name]   DOB:  1971/01/29   MRN:  [id number]     Date/Time:  03/05/2016    Assessment :    Plan:  esrd  Hyperkalemia  Mild pulmonary edema on cxr  Uncontrolled htn  Perm cath  Failed AV accesses  Anemia of CKD  Swollen arm/hand/central stenosis     HD today on 2 k bath  Fecal transplant aborted (again) due to poor prep and no pseudomembranes noted on exam  Pt "forgot" what dr. Owens Shark told him about arm-reviewed all that with him. Now wants stent today-reviewed with pt that this can be done as an outpatient as I Dr. Owens Shark can't do this today.  No need to keep him over the weekend for this-chronic problem.  Pt has been hospital shopping-recently at [facility]  Nephrologists are in [place] after HD today--he has NOT completed a full HD since admission  His poor behavior/compliance issues have led to him not having a dialysis unit-in my humble opinion, he can still be discharged-he can go to the ED in [place] if no unit accepts him  WIl see again on Monday, if still here.      D/W Dr. Lora Havens was seen outside the hospital earlier this am by him--    Subjective:   CHIEF COMPLAINT:  "I can't remember what dr. Owens Shark told me"--    I discussed the case earlier in week with dr. Margaretmary Bayley spent atleast 30 min with the pt going over his options.  Pt demonstrates manipulative behavior.     Past Medical History:   Diagnosis Date   ??? Ascites    ??? Asthma    ??? C. difficile colitis    ??? CKD (chronic kidney disease) stage V requiring chronic dialysis ([facility])    ??? HTN (hypertension)    ??? SBP (spontaneous bacterial peritonitis) [facility])       Past Surgical History:   Procedure Laterality Date   ??? COLONOSCOPY N/A 03/04/2016    COLONOSCOPY performed by Macie Burows, MD at [facility]   ??? HX HERNIA REPAIR     ??? SIGMOIDOSCOPY,DIAGNOSTIC  03/04/2016          Social History   Substance Use Topics   ??? Smoking status: Heavy Tobacco Smoker     Packs/day: 0.50     Years: 10.00    ??? Smokeless tobacco: Never Used   ??? Alcohol use No      History reviewed. No pertinent family history.   Allergies   Allergen Reactions   ??? Lisinopril Shortness of Breath   ??? Morphine Shortness of Breath   ??? Toradol [Ketorolac] Shortness of Breath      Prior to Admission medications    Medication Sig Start Date End Date Taking? Authorizing Provider   ondansetron (ZOFRAN ODT) 8 mg disintegrating tablet Take 1 Tab by mouth every eight (8) hours as needed for Nausea. 01/15/16   Zollie Pee, MD   albuterol (PROVENTIL HFA, VENTOLIN HFA, PROAIR HFA) 90 mcg/actuation inhaler Take 2 Puffs by inhalation every six (6) hours as needed for Wheezing. 12/19/15   Eula Listen, MD   methylPREDNISolone (MEDROL, PAK,) 4 mg tablet Use as directed 12/19/15   Eula Listen, MD   sevelamer (RENAGEL) 400 mg tablet Take 800 mg by mouth three (3) times daily (with meals).    Phys Other, MD   carvedilol (COREG) 12.5 mg tablet Take  by mouth two (2) times daily (  with meals).    Phys Other, MD   hydrALAZINE (APRESOLINE) 100 mg tablet Take 100 mg by mouth three (3) times daily.    Phys Other, MD   amLODIPine (NORVASC) 10 mg tablet Take 10 mg by mouth daily.    Phys Other, MD   pantoprazole (PROTONIX) 40 mg tablet Take 40 mg by mouth daily.    Phys Other, MD   furosemide (LASIX) 80 mg tablet Take 80 mg by mouth daily.    Phys Other, MD   albuterol (PROVENTIL HFA, VENTOLIN HFA, PROAIR HFA) 90 mcg/actuation inhaler Take 2 Puffs by inhalation every four (4) hours as needed for Wheezing.    Phys Other, MD     REVIEW OF SYSTEMS:     (+) hand pain/swelling    Objective:   VITALS:    Visit Vitals   ??? BP (!) 156/97 (BP 1 Location: Right arm, BP Patient Position: At rest)   ??? Pulse 91   ??? Temp 98.2 ??F (36.8 ??C)   ??? Resp 18   ??? Ht '6\' 1"'  (1.854 m)   ??? Wt 112.8 kg (248 lb 10.9 oz)   ??? SpO2 99%   ??? BMI 32.81 kg/m2     PHYSICAL EXAM:  Gen:  '[x]'   WD '[x]'   WN  '[]'  cachectic '[]'   thin '[]'   obese '[]'   disheveled              '[]'   ill apearing  '[]'    Critical  '[]'    Chronic    '[x]'   No acute distress    HEENT:   '[x]'  NC/AT/PERRLA/EOMI    '[]'  pink conjunctivae      '[]'  pale conjunctivae                  PERRL  '[]'  yes  '[]'  no      '[]'  moist mucosa    '[]'  dry mucosa    hearing intact to voice '[x]'  yes  '[]'  No                 NECK:   supple '[x]'  yes  '[]'  no        masses '[]'  yes  '[]'  No               thyroid  '[]'   non tender  '[]'   tender    RESP:   '[x]'  CTA bilaterally/no wheezing/rhonchi/rales/crackles    '[]'  rhonchi bilaterally - no dullness  '[]'  wheezing   '[]'  rhonchi   '[]'  crackles     use of accessory muscles '[]'  yes '[x]'  no    CARD:   '[x]'   regular rate and rhythm    murmur  '[]'  yes ()  '[]'  no      Rubs  '[]'  yes  '[]'  no       Gallops '[]'  yes  '[]'  no    Rate '[]'   regular  '[]'   irregular        carotid bruits  '[]'  Right  '[]'   Left                 LE edema '[]'  yes  '[x]'  no      Has upper ext swelling        ABD:    '[]'  soft/non distended/non tender/+bowel sounds/no HSM    '[]'   Rigid    tenderness '[]'  yes '[]'  no   Liver enlargement  '[]'   yes '[]'   no  Spleen enlargement  '[]'   yes '[]'   no     distended '[]'   yes '[]'  no     bowel sound  '[]'  hypoactive   '[]'  hyperactive    NEUR:   '[x]'  cranial nerves II-XII grossly intact       '[]'  Cranial nerves deficit                 '[]'   facial droop    '[]'   slurred speech   '[]'  aphasic     '[]'  Strength normal     '[]'   weakness  '[]'   LUE  '[]'    RUE/ '[]'   LLE  '[]'    RLE    follows commands  '[x]'   yes '[]'   no             LAB DATA REVIEWED:    Recent Labs      03/03/16   0550   WBC  2.7*   HGB  8.0*   HCT  24.9*   PLT  129*     Recent Labs      03/03/16   0550   NA  137   K  5.7*   CL  104   CO2  24   BUN  49*   CREA  9.41*   GLU  73   CA  6.7*   MG  2.0   PHOS  5.8*     No results for input(s): SGOT, GPT, ALT, AP, TBIL, TBILI, ALB, GLOB, GGT, AML, LPSE in the last 72 hours.    No lab exists for component: AMYP, HLPSE  No results for input(s): INR, PTP, APTT in the last 72 hours.    No lab exists for component: INREXT, INREXT    No results for input(s): FE, TIBC, PSAT, FERR in the last 72 hours.   No results for input(s): PH, PCO2, PO2 in the last 72 hours.  No results for input(s): CPK, CKMB in the last 72 hours.    No lab exists for component: TROPONINI  Lab Results   Component Value Date/Time    Glucose (POC) 96 03/04/2016 02:04 PM    Glucose (POC) 105 02/29/2016 11:05 AM       Procedures: see electronic medical records for all procedures/Xrays and details which were not copied into this note but were reviewed prior to creation of Plan.    ________________________________________________________________________       ___________________________________________________  Consulting Physician: Lucretia Roers, MD

## 2016-03-05 NOTE — Progress Notes (Signed)
Bedside and Verbal shift change report given to [facility] (oncoming nurse) by Vida Rollerhelsea RN (offgoing nurse). Report included the following information SBAR, Kardex, Intake/Output, MAR and Recent Results.    Zone Phone for oncoming shift:   [phone]    Shift Summary: Patient rested quietly throughout the night. C/O pain to ABD, PRN pain medication given.     LDAs               Peripheral IV 02/29/16 Right Other(comment) (Active)   Site Assessment Clean, dry, & intact 03/05/2016  3:17 AM   Phlebitis Assessment 0 03/05/2016  3:17 AM   Infiltration Assessment 0 03/05/2016  3:17 AM   Dressing Status Clean, dry, & intact 03/05/2016  3:17 AM   Dressing Type Tape;Transparent 03/05/2016  3:17 AM   Hub Color/Line Status Pink;Capped;Flushed 03/05/2016  3:17 AM        Hemodialysis Catheter (Active)   Central Line Being Utilized Yes 03/05/2016  3:17 AM   Criteria for Appropriate Use Dialysis/apheresis 03/05/2016  3:17 AM   Site Assessment Clean, dry, & intact 03/05/2016  3:17 AM   Date of Last Dressing Change 03/01/16 03/05/2016  3:17 AM   Dressing Status Clean, dry, & intact 03/05/2016  3:17 AM   Dressing Type Disk with Chlorhexadine gluconate (CHG) 03/05/2016  3:17 AM   Proximal Hub Color/Line Status Blue;Capped 03/05/2016  3:17 AM   Distal Hub Color/Line Status Red;Capped 03/05/2016  3:17 AM                    Intake & Output   Date 03/04/16 0700 - 03/05/16 0659 03/05/16 0700 - 03/06/16 0659   Shift 0700-1859 1900-0659 24 Hour Total 0700-1859 1900-0659 24 Hour Total   I  N  T  A  K  E   I.V.  (mL/kg/hr) 100  (0.1)  100         I.V. 50  50         Volume (0.9% sodium chloride infusion) 50  50       Shift Total  (mL/kg) 100  (0.9)  100  (0.9)      O  U  T  P  U  T   Stool            Stool Occurrence(s) 7 x  7 x       Dialysis 2500  2500         NET Fluid Removed (mL) 2500  2500       Shift Total  (mL/kg) 2500  (22.2)  2500  (22.2)      NET -2400  -2400      Weight (kg) 112.8 112.8 112.8 112.8 112.8 112.8       Last Bowel Movement Last Bowel Movement Date: 03/04/16   Glucose Checks [x]  N/A  []  AC/HS  []  Q6  Concerns:   Nutrition Active Orders   Diet    DIET RENAL Regular       Consults [] PT  [] OT  [] Speech  [] Case Management   Cardiac Monitoring [x] N/A [] Yes Expires:

## 2016-03-05 NOTE — Progress Notes (Signed)
Infectious Disease Progress Note      IMPRESSION:   1. Recurrent C.Diff colitis  2. FMT could not be done due to poor preparation , normal bowel mucosa noted/ formed, solid  stool in rectum  3. ? Compliance / reliability of history - has been questioned from time of admission        PLAN:   1.  Re start  on vancomycin pulsed taper, if possible supervised , probiotics ( see below)     Subjective:      Pt seen.  Reports 3 bowel movements again  Today, reported to Nurse. Nurse states he has not had  Reported any bowel movement    Review of Systems:  A comprehensive review of systems was negative except for that written in the History of Present Illness.        Objective:     Blood pressure (!) 156/97, pulse 91, temperature 98.2 ??F (36.8 ??C), resp. rate 18, height 6\' 1"  (1.854 m), weight 112.8 kg (248 lb 10.9 oz), SpO2 99 %.  Temp (24hrs), Avg:98.3 ??F (36.8 ??C), Min:97.9 ??F (36.6 ??C), Max:98.7 ??F (37.1 ??C)      Lines:  Peripheral IV:         HD access in chest    Physical Exam:   General:  Alert, cooperative,    Eyes:  Sclera anicteric. Pupils equally round and reactive to light.   Mouth/Throat: Mucous membranes normal, oral pharynx clear   Neck: Supple   Lungs:   Clear to auscultation bilaterally, good effort   CV:  Regular rate and rhythm,no murmur, click, rub or gallop   Abdomen:   Soft, non-tender. bowel sounds normal. Mildly -distended   Extremities: No cyanosis or edema   Skin: Skin color, texture, turgor normal. no acute rash or lesions   Lymph nodes: Cervical and supraclavicular normal   Musculoskeletal: No swelling or deformity   Lines/Devices:  Intact, no erythema, drainage or tenderness   Psych: Alert and oriented, normal mood affect       Data Review:   CBC:   Recent Labs      03/03/16   0550   WBC  2.7*   RBC  2.70*   HGB  8.0*   HCT  24.9*   PLT  129*     CMP:   Recent Labs      03/03/16   0550   GLU  73   NA  137   K  5.7*   CL  104   CO2  24   BUN  49*   CREA  9.41*   CA  6.7*   AGAP  9   BUCR  5*        Studies:      Lab Results   Component Value Date/Time    Culture result: NO GROWTH 4 DAYS 03/01/2016 01:00 PM    Culture result:  02/29/2016 11:28 AM     NO ROUTINE ENTERIC PATHOGENS ISOLATED INCLUDING SALMONELLA, SHIGELLA, YERSINIA, VIBRIO OR SHIGA TOXIN PRODUCING E. COLI    Culture result: NO GROWTH 5 DAYS 02/29/2016 06:44 AM    Culture result:  01/15/2016 07:36 AM     NO ROUTINE ENTERIC PATHOGENS ISOLATED INCLUDING SALMONELLA, SHIGELLA, YERSINIA, VIBRIO OR SHIGA TOXIN PRODUCING E. COLI    Culture result: NO GROWTH 4 DAYS 11/21/2015 09:32 PM          XR Results (most recent):    Results from Hospital Encounter encounter on 02/29/16  XR CHEST PORT   Narrative Clinical indication: Possible pneumonia. Chronic renal insufficiency and asthma.    Portable AP erect view of the chest is obtained, comparison November 12. There  is significant improvement in the bilateral infiltrates since the prior  examination. Heart size remains prominent with minimal interstitial prominence,  vascular stent and double lumen catheter unchanged.         Impression impression: Significant improvement in bilateral infiltrates.        Patient Active Problem List   Diagnosis Code   ??? Gastroenteritis K52.9   ??? Peritonitis (HCC) K65.9   ??? Recurrent Clostridium difficile diarrhea A04.71   ??? Ascites R18.8   ??? ESRD (end stage renal disease) on dialysis (HCC) N18.6, Z99.2         ICD-10-CM ICD-9-CM    1. Acute hyperkalemia E87.5 276.7    2. Abdominal pain, generalized R10.84 789.07    3. Diarrhea, unspecified type R19.7 787.91    4. ESRD (end stage renal disease) (HCC) N18.6 585.6    5. Pleural effusion J90 511.9    6. Hypocalcemia E83.51 275.41    7. Elevated CPK R74.8 790.5        I have discussed the diagnosis with the patient and the intended plan as seen in the above orders.     I have discussed medication side effects and warnings with the patient as well.    Reviewed test results at length with patient    Anti-infectives:      Vancomycin Po 250 mg po 4 times daily x 7 days  Vancomycin 250 mg 2 times daily x 7 days  Vancomycin 125 mg daily x 7 days  Vancomycin 125 mg every other day x 7 days  Vancomycin 125 mg q3d x 2 weeks.     Take probiotics daily,    Follow up in clinic in 4 weeks        Richardean Chimeraiane S Cornell Bourbon, MD Jerrel IvoryFACP

## 2016-03-05 NOTE — Progress Notes (Signed)
The pt [place] [facility] [place] [facility] [place].  [facility] [place] the same nephrologist who has denied the pt at both locations.  [place].  The pt is aware of the above.  IM on chart.  Updates sent to HD center.

## 2016-03-05 NOTE — Progress Notes (Signed)
Hospitalist Progress Note  NAME: David [name]   DOB:  04-07-1971   MRN:  [id number]       Assessment / Plan:  Recurrent C diff  (10 th episode per pt )  -Pt with abdominal pain, nausea, vomiting, and decreased PO intake on presentation   -c diff is positive   - on enteric precautions  -Last abx use was last month and he supposedly received Flagyl and Vanco  -FMT was aborted today due to incomplete colon prep and there were no pseudomembranes GI questioned the need for the procedure give his compliance to medications and colon prep  -HIV non reactive   -ID recommends po vancomycin taper      ESRD on HD MWF for 7 years, missed 11/10 session  Hyperkalemia  LUE swelling  - no peaked T waves on EKG  -Given hyperkalemia cocktail by ED  -AVF LUE is not working as per the pt, which is why he has a L chest catheter  -LUE US pending shows patent veins and being evaluated by Dr Manson PasseyBrown  -Dr Manson PasseyBrown has given options for the pt as below for management of left arm swelling    1. Ligate LUE AVF which will resolve arm swelling but leave him with only catheter access.   ????2. Begin to cannulate AVF again (no one has tried since July) and if successful remove catheter and treat central stenosis with balloon angioplasty at same time.  -pt wants the procedure on in pt side but Dr Manson PasseyBrown recommends doing it on the out pt side. He will be discharged today for out pt follow  Up with Dr Manson PasseyBrown. He apparently appealed the discharge and does not want to leave the hospital until left arm stenosis is addressed.    Peritoneal fluid cell count 238 with concern for peritonitis  likey resolving peritonitis and no need for rx per ID and renal.    Suspected viral URTI resolved   -lung exam is benign.  -no fever or leucocytosis  -CXR shows improvement of pulmonary infilterates    Hypertension, controlled  -BP stable   -on clonidine,coreg and norvasc.    Hx of Hepatitis B  -needs out pt f/u on discharge     Recurrent Ascites unclear cause needs out pt follow up  -CT AP noted, no evidence of cirrhosis - pt denies EtOH use in the past 10 years   -States that he has been worked up for this in the past and it is not because of cirrhosis  -SAAG is less than  1.1 so protal HTN is unlikely and Lft's are normal   -Echo shows normal EF and no diastolic dysfunction. He does not make any urine to look for urine protein  -will get records and so far we have requested multiple times but did not get any so far.   -palliative care following.  -will need additional ascitis fluid studies sent with next tap including  Fluid AFB smear, culture, CEA, AFP, cytology with next fluid draw   -will need out pt f/u with his primary gastroenterologist.      Body mass index is 31.73 kg/(m^2).    Code status: Full  Prophylaxis: SCD  Recommended Disposition: Home w/Family     Subjective:     Chief Complaint / Reason for Physician Visit  Reports left arm swelling    Review of Systems:  Symptom Y/N Comments  Symptom Y/N Comments   Fever/Chills    Chest Pain n  Poor Appetite n   Edema     Cough    Abdominal Pain y    Sputum    Joint Pain     SOB/DOE n   Pruritis/Rash     Nausea/vomit n   Tolerating PT/OT y    Diarrhea y   Tolerating Diet y    Constipation n   Other       Could NOT obtain due to:      Objective:     VITALS:   Last 24hrs VS reviewed since prior progress note. Most recent are:  Patient Vitals for the past 24 hrs:   Temp Pulse Resp BP SpO2   03/06/16 0752 98 ??F (36.7 ??C) 92 18 (!) 155/95 96 %   03/06/16 0054 - (!) 102 - (!) 161/98 -   03/06/16 0021 - (!) 106 - (!) 194/113 -   03/06/16 0000 - (!) 106 - (!) 192/111 -   03/05/16 2345 - (!) 102 - - -   03/05/16 2330 - (!) 106 17 (!) 159/104 -   03/05/16 2315 - (!) 103 17 (!) 167/98 -   03/05/16 2300 - (!) 101 18 (!) 183/96 -   03/05/16 2245 - 98 18 (!) 153/93 -   03/05/16 2230 - 100 18 (!) 162/105 -   03/05/16 2215 - 97 18 (!) 164/93 -   03/05/16 2200 - (!) 102 18 (!) 160/102 -    03/05/16 2145 - 93 18 (!) 168/107 -   03/05/16 2127 97.7 ??F (36.5 ??C) 98 18 (!) 168/95 -   03/05/16 1443 97.8 ??F (36.6 ??C) 99 18 (!) 148/93 95 %       Intake/Output Summary (Last 24 hours) at 03/06/16 1426  Last data filed at 03/06/16 0021   Gross per 24 hour   Intake              480 ml   Output             2500 ml   Net            -2020 ml        PHYSICAL EXAM:  General: cooperative, no acute distress????  EENT:  EOMI. Anicteric sclerae. MMM  Resp:  B/l air entry +, basal crackles +  CV:  Regular  rhythm,?? No edema  GI:  Soft, non tender, distended, BS+, dressing + over paracentesis site   Neurologic:?? Alert and oriented X 3, normal speech, no focal deficits  Psych:???? Fair insight.??Not anxious nor agitated  Skin:  No rashes.  No jaundice.  MSK:  LUE swelling, radial pulse +     Reviewed most current lab test results and cultures  YES  Reviewed most current radiology test results   YES  Review and summation of old records today    NO  Reviewed patient's current orders and MAR    YES  PMH/SH reviewed - no change compared to H&P      Current Facility-Administered Medications:   ???  psyllium husk-aspartame (METAMUCIL FIBER) packet 1 Packet, 1 Packet, Oral, BID, Sandy Salaam, MD, 1 Packet at 03/06/16 1005  ???  vancomycin 50 mg/mL oral solution (compounded) 250 mg, 250 mg, Oral, QID, Richardean Chimera, MD, 250 mg at 03/06/16 1337  ???  [START ON 03/13/2016] vancomycin 50 mg/mL oral solution (compounded) 250 mg, 250 mg, Oral, BID, Richardean Chimera, MD  ???  [START ON 03/20/2016] vancomycin 50 mg/mL oral solution (compounded) 125 mg, 125 mg,  Oral, DAILY, Richardean Chimeraiane S Sinnatamby, MD  ???  [START ON 03/27/2016] vancomycin 50 mg/mL oral solution (compounded) 125 mg, 125 mg, Oral, EVERY OTHER DAY, Richardean Chimeraiane S Sinnatamby, MD  ???  [START ON 04/03/2016] vancomycin 50 mg/mL oral solution (compounded) 125 mg, 125 mg, Oral, Q3DAYS, Richardean Chimeraiane S Sinnatamby, MD  ???  lactobac ac& pc-s.therm-b.anim (FLORA Q/RISAQUAD), 1 Cap, Oral, DAILY,  Vena AustriaVarun K Barrett Goldie, MD, 1 Cap at 03/06/16 1006  ???  0.9% sodium chloride infusion, 75 mL/hr, IntraVENous, CONTINUOUS, Lamar SprinklesJonathan G Gaspar, MD, Stopped at 03/04/16 1501  ???  albuterol (PROVENTIL VENTOLIN) nebulizer solution 2.5 mg, 2.5 mg, Nebulization, Q4H PRN, Tanja PortPhillip D Klahr, MD  ???  epoetin alfa (EPOGEN;PROCRIT) injection 10,000 Units, 10,000 Units, SubCUTAneous, Q MON, WED & FRI, Gary Fleetrudy L Rickman, MD  ???  cinacalcet (SENSIPAR) tablet 30 mg, 30 mg, Oral, DAILY WITH BREAKFAST, Gary Fleetrudy L Rickman, MD, 30 mg at 03/06/16 1005  ???  doxercalciferol (HECTOROL) 4 mcg/2 mL injection 2 mcg, 2 mcg, IntraVENous, DIALYSIS MON, WED & FRI, Gary Fleetrudy L Rickman, MD, 2 mcg at 03/06/16 0009  ???  patiromer calcium sorbitex (VELTASSA) powder 8.4 g, 8.4 g, Oral, DAILY, Gary Fleetrudy L Rickman, MD, 8.4 g at 03/05/16 1734  ???  albuterol (PROVENTIL HFA, VENTOLIN HFA, PROAIR HFA) inhaler 2 Puff, 2 Puff, Inhalation, Q6H PRN, Virl DiamondJoanne E Lapetina, MD, 2 Puff at 03/06/16 [phone]  ???  cloNIDine HCl (CATAPRES) tablet 0.1 mg, 0.1 mg, Oral, Q12H, Gary Fleetrudy L Rickman, MD, 0.1 mg at 03/04/16 1200  ???  sodium chloride (NS) flush 5-10 mL, 5-10 mL, IntraVENous, PRN, Edward JollyFloyd D. Providence LaniusHowell, MD, 10 mL at 03/03/16 1925  ???  amLODIPine (NORVASC) tablet 10 mg, 10 mg, Oral, DAILY, Earvin HansenSamip S Dave, MD, 10 mg at 03/04/16 1200  ???  carvedilol (COREG) tablet 12.5 mg, 12.5 mg, Oral, BID WITH MEALS, Earvin HansenSamip S Dave, MD, 12.5 mg at 03/04/16 1807  ???  sevelamer (RENAGEL) tablet 800 mg, 800 mg, Oral, TID WITH MEALS, Earvin HansenSamip S Dave, MD, 800 mg at 03/06/16 1329  ???  sodium chloride (NS) flush 5-10 mL, 5-10 mL, IntraVENous, Q8H, Earvin HansenSamip S Dave, MD, 10 mL at 03/06/16 1333  ???  ondansetron (ZOFRAN) injection 4 mg, 4 mg, IntraVENous, Q4H PRN, Earvin HansenSamip S Dave, MD, 4 mg at 03/05/16 1923  ???  HYDROmorphone (PF) (DILAUDID) injection 2 mg, 2 mg, IntraVENous, Q3H PRN, Virl DiamondJoanne E Lapetina, MD, 2 mg at 03/06/16 1329    ________________________________________________________________________  Care Plan discussed with:    Comments   Patient y     Family      RN y    Care Manager     Consultant  y Dr Shella MaximSittamby                     Multidiciplinary team rounds were held today with case manager, nursing, pharmacist and clinical coordinator.  Patient's plan of care was discussed; medications were reviewed and discharge planning was addressed.     ________________________________________________________________________  Total NON critical care TIME:  25   Minutes    Total CRITICAL CARE TIME Spent:   Minutes non procedure based      Comments   >50% of visit spent in counseling and coordination of care y    ________________________________________________________________________  Vena AustriaVarun K Romualdo Prosise, MD     Procedures: see electronic medical records for all procedures/Xrays and details which were not copied into this note but were reviewed prior to creation of Plan.      LABS:  I reviewed  today's most current labs and imaging studies.  Pertinent labs include:  No results for input(s): WBC, HGB, HCT, PLT, HGBEXT, HCTEXT, PLTEXT, HGBEXT, HCTEXT, PLTEXT in the last 72 hours.  No results for input(s): NA, K, CL, CO2, GLU, BUN, CREA, CA, MG, PHOS, ALB, TBIL, TBILI, SGOT, ALT, INR in the last 72 hours.    No lab exists for component: [place], INREXT    Signed: Vena Austria, MD

## 2016-03-05 NOTE — Progress Notes (Deleted)
Hospitalist Progress Note  NAME: David [name]   DOB:  December 18, 1970   MRN:  [id number]       Assessment / Plan:  Recurrent C diff  (10 th episode per pt )  -Pt with abdominal pain, nausea, vomiting, and decreased PO intake on presentation   -c diff is positive   - on enteric precautions  -Last abx use was last month and he supposedly received Flagyl and Vanco  -FMT was aborted today due to incomplete colon prep and there were no pseudomembranes GI questioned the need for the procedure give his compliance to medications and colon prep  -HIV pending       ESRD on HD MWF for 7 years, missed 11/10 session  Hyperkalemia  LUE swelling  - no peaked T waves on EKG  -Given hyperkalemia cocktail by ED  -AVF LUE is not working as per the pt, which is why he has a L chest catheter  -LUE US pending shows patent veins and being evaluated by Dr Manson PasseyBrown  -Dr Manson PasseyBrown has given options for the pt as below for management of left arm swelling    1. Ligate LUE AVF which will resolve arm swelling but leave him with only catheter access.   ????2. Begin to cannulate AVF again (no one has tried since July) and if successful remove catheter and treat central stenosis with balloon angioplasty at same time.  -pt will decide on above options and will inform Dr Manson PasseyBrown.    Peritoneal fluid cell count 238 with concern for peritonitis  Will stop ceftriaxone after today's dose based on recommendations from ID and renal.    Suspected viral URTI  -lung exam is benign.  -no fever or leucocytosis  -CXR shows improvement of pulmonary infilterates    Hypertension, controlled  -BP stable   -on clonidine,coreg and norvasc.    Hx of Hepatitis B  -needs out pt f/u on discharge    Recurrent Ascites unclear cause work up underway  -CT AP noted, no evidence of cirrhosis - pt denies EtOH use in the past 10 years   -States that he has been worked up for this in the past and it is not because of cirrhosis   -SAAG is less than  1.1 so protal HTN is unlikely and Lft's are normal   -Echo shows normal EF and no diastolic dysfunction. He does not make any urine to look for urine protein  -will get records and so far we have requested multiple times but did not get any so far.   -palliative care following.  -will need additional ascitis fluid studies sent with next tap including  Fluid AFB smear, culture, CEA, AFP, cytology with next fluid draw       Body mass index is 31.73 kg/(m^2).    Code status: Full  Prophylaxis: SCD  Recommended Disposition: Home w/Family     Subjective:     Chief Complaint / Reason for Physician Visit  FMT procedure was aborted due to hard stool and in complete colon prep.  Reports having 3 loose stools today but per nursing no bowel moments.    Review of Systems:  Symptom Y/N Comments  Symptom Y/N Comments   Fever/Chills    Chest Pain n    Poor Appetite n   Edema     Cough    Abdominal Pain y    Sputum    Joint Pain     SOB/DOE n   Pruritis/Rash  Nausea/vomit n   Tolerating PT/OT y    Diarrhea y   Tolerating Diet y    Constipation n   Other       Could NOT obtain due to:      Objective:     VITALS:   Last 24hrs VS reviewed since prior progress note. Most recent are:  Patient Vitals for the past 24 hrs:   Temp Pulse Resp BP SpO2   03/06/16 0752 98 ??F (36.7 ??C) 92 18 (!) 155/95 96 %   03/06/16 0054 - (!) 102 - (!) 161/98 -   03/06/16 0021 - (!) 106 - (!) 194/113 -   03/06/16 0000 - (!) 106 - (!) 192/111 -   03/05/16 2345 - (!) 102 - - -   03/05/16 2330 - (!) 106 17 (!) 159/104 -   03/05/16 2315 - (!) 103 17 (!) 167/98 -   03/05/16 2300 - (!) 101 18 (!) 183/96 -   03/05/16 2245 - 98 18 (!) 153/93 -   03/05/16 2230 - 100 18 (!) 162/105 -   03/05/16 2215 - 97 18 (!) 164/93 -   03/05/16 2200 - (!) 102 18 (!) 160/102 -   03/05/16 2145 - 93 18 (!) 168/107 -   03/05/16 2127 97.7 ??F (36.5 ??C) 98 18 (!) 168/95 -   03/05/16 1443 97.8 ??F (36.6 ??C) 99 18 (!) 148/93 95 %        Intake/Output Summary (Last 24 hours) at 03/06/16 1425  Last data filed at 03/06/16 0021   Gross per 24 hour   Intake              480 ml   Output             2500 ml   Net            -2020 ml        PHYSICAL EXAM:  General: cooperative, no acute distress????  EENT:  EOMI. Anicteric sclerae. MMM  Resp:  B/l air entry +, basal crackles +  CV:  Regular  rhythm,?? No edema  GI:  Soft, non tender, distended, BS+, dressing + over paracentesis site   Neurologic:?? Alert and oriented X 3, normal speech, no focal deficits  Psych:???? Fair insight.??Not anxious nor agitated  Skin:  No rashes.  No jaundice.  MSK:  LUE swelling, radial pulse +     Reviewed most current lab test results and cultures  YES  Reviewed most current radiology test results   YES  Review and summation of old records today    NO  Reviewed patient's current orders and MAR    YES  PMH/SH reviewed - no change compared to H&P      Current Facility-Administered Medications:   ???  psyllium husk-aspartame (METAMUCIL FIBER) packet 1 Packet, 1 Packet, Oral, BID, Sandy Salaam, MD, 1 Packet at 03/06/16 1005  ???  vancomycin 50 mg/mL oral solution (compounded) 250 mg, 250 mg, Oral, QID, Richardean Chimera, MD, 250 mg at 03/06/16 1337  ???  [START ON 03/13/2016] vancomycin 50 mg/mL oral solution (compounded) 250 mg, 250 mg, Oral, BID, Richardean Chimera, MD  ???  [START ON 03/20/2016] vancomycin 50 mg/mL oral solution (compounded) 125 mg, 125 mg, Oral, DAILY, Richardean Chimera, MD  ???  [START ON 03/27/2016] vancomycin 50 mg/mL oral solution (compounded) 125 mg, 125 mg, Oral, EVERY OTHER DAY, Richardean Chimera, MD  ???  [START ON 04/03/2016] vancomycin 50 mg/mL  oral solution (compounded) 125 mg, 125 mg, Oral, Q3DAYS, Richardean Chimera, MD  ???  lactobac ac& pc-s.therm-b.anim (FLORA Q/RISAQUAD), 1 Cap, Oral, DAILY, Vena Austria, MD, 1 Cap at 03/06/16 1006  ???  0.9% sodium chloride infusion, 75 mL/hr, IntraVENous, CONTINUOUS, Lamar Sprinkles, MD, Stopped at 03/04/16 1501   ???  albuterol (PROVENTIL VENTOLIN) nebulizer solution 2.5 mg, 2.5 mg, Nebulization, Q4H PRN, Tanja Port, MD  ???  epoetin alfa (EPOGEN;PROCRIT) injection 10,000 Units, 10,000 Units, SubCUTAneous, Q MON, WED & FRI, Gary Fleet, MD  ???  cinacalcet (SENSIPAR) tablet 30 mg, 30 mg, Oral, DAILY WITH BREAKFAST, Gary Fleet, MD, 30 mg at 03/06/16 1005  ???  doxercalciferol (HECTOROL) 4 mcg/2 mL injection 2 mcg, 2 mcg, IntraVENous, DIALYSIS MON, WED & FRI, Gary Fleet, MD, 2 mcg at 03/06/16 0009  ???  patiromer calcium sorbitex (VELTASSA) powder 8.4 g, 8.4 g, Oral, DAILY, Gary Fleet, MD, 8.4 g at 03/05/16 1734  ???  albuterol (PROVENTIL HFA, VENTOLIN HFA, PROAIR HFA) inhaler 2 Puff, 2 Puff, Inhalation, Q6H PRN, Virl Diamond, MD, 2 Puff at 03/06/16 [phone]  ???  cloNIDine HCl (CATAPRES) tablet 0.1 mg, 0.1 mg, Oral, Q12H, Gary Fleet, MD, 0.1 mg at 03/04/16 1200  ???  sodium chloride (NS) flush 5-10 mL, 5-10 mL, IntraVENous, PRN, Edward Jolly. Providence Lanius, MD, 10 mL at 03/03/16 1925  ???  amLODIPine (NORVASC) tablet 10 mg, 10 mg, Oral, DAILY, Earvin Hansen, MD, 10 mg at 03/04/16 1200  ???  carvedilol (COREG) tablet 12.5 mg, 12.5 mg, Oral, BID WITH MEALS, Earvin Hansen, MD, 12.5 mg at 03/04/16 1807  ???  sevelamer (RENAGEL) tablet 800 mg, 800 mg, Oral, TID WITH MEALS, Earvin Hansen, MD, 800 mg at 03/06/16 1329  ???  sodium chloride (NS) flush 5-10 mL, 5-10 mL, IntraVENous, Q8H, Earvin Hansen, MD, 10 mL at 03/06/16 1333  ???  ondansetron (ZOFRAN) injection 4 mg, 4 mg, IntraVENous, Q4H PRN, Earvin Hansen, MD, 4 mg at 03/05/16 1923  ???  HYDROmorphone (PF) (DILAUDID) injection 2 mg, 2 mg, IntraVENous, Q3H PRN, Virl Diamond, MD, 2 mg at 03/06/16 1329    ________________________________________________________________________  Care Plan discussed with:    Comments   Patient y    Family      RN y    Care Manager     Consultant  y Dr Shella Maxim                     Multidiciplinary team rounds were held today with case  manager, nursing, pharmacist and clinical coordinator.  Patient's plan of care was discussed; medications were reviewed and discharge planning was addressed.     ________________________________________________________________________  Total NON critical care TIME:  25   Minutes    Total CRITICAL CARE TIME Spent:   Minutes non procedure based      Comments   >50% of visit spent in counseling and coordination of care y    ________________________________________________________________________  Vena Austria, MD     Procedures: see electronic medical records for all procedures/Xrays and details which were not copied into this note but were reviewed prior to creation of Plan.      LABS:  I reviewed today's most current labs and imaging studies.  Pertinent labs include:  No results for input(s): WBC, HGB, HCT, PLT, HGBEXT, HCTEXT, PLTEXT, HGBEXT, HCTEXT, PLTEXT in the last 72 hours.  No results for input(s): NA, K, CL,  CO2, GLU, BUN, CREA, CA, MG, PHOS, ALB, TBIL, TBILI, SGOT, ALT, INR in the last 72 hours.    No lab exists for component: [place], INREXT    Signed: Vena AustriaVarun K Mendy Lapinsky, MD

## 2016-03-05 NOTE — Progress Notes (Addendum)
Bedside and Verbal shift change report given to Holiday representativeMarietah RN (oncoming nurse) by Jeryl ColumbiaMichele Gordon RN (offgoing nurse). Report included the following information SBAR, Kardex, Intake/Output, MAR and Recent Results.    Zone Phone for oncoming shift:   [phone]    Shift Summary: VS stable. Still awaiting HD. [facility]. [facility]. Pt states he appeals his discharge.    LDAs               Peripheral IV 02/29/16 Right Other(comment) (Active)   Site Assessment Clean, dry, & intact 03/05/2016  3:47 PM   Phlebitis Assessment 0 03/05/2016  3:47 PM   Infiltration Assessment 0 03/05/2016  3:47 PM   Dressing Status Clean, dry, & intact 03/05/2016  3:47 PM   Dressing Type Transparent 03/05/2016  3:47 PM   Hub Color/Line Status Pink;Capped 03/05/2016  3:47 PM        Hemodialysis Catheter (Active)   Central Line Being Utilized Yes 03/05/2016  3:47 PM   Criteria for Appropriate Use Dialysis/apheresis 03/05/2016  3:47 PM   Site Assessment Clean, dry, & intact 03/05/2016  3:47 PM   Date of Last Dressing Change 03/01/16 03/05/2016  3:47 PM   Dressing Status Clean, dry, & intact 03/05/2016  3:47 PM   Dressing Type Disk with Chlorhexadine gluconate (CHG);Transparent 03/05/2016  3:47 PM   Proximal Hub Color/Line Status Blue;Capped 03/05/2016  3:47 PM   Distal Hub Color/Line Status Red;Capped 03/05/2016  3:47 PM                    Intake & Output   Date 03/04/16 1900 - 03/05/16 0659 03/05/16 0700 - 03/06/16 0659   Shift 1900-0659 24 Hour Total 0700-1859 1900-0659 24 Hour Total   I  N  T  A  K  E   P.O.   480  480      P.O.   480  480    I.V.  (mL/kg/hr)  100         I.V.  50         Volume (0.9% sodium chloride infusion)  50       Shift Total  (mL/kg)  100  (0.9) 480  (4.3)  480  (4.3)   O  U  T  P  U  T   Stool           Stool Occurrence(s)  7 x       Dialysis  2500         NET Fluid Removed (mL)  2500       Shift Total  (mL/kg)  2500  (22.2)      NET  -2400 480  480    Weight (kg) 112.8 112.8 112.8 112.8 112.8      Last Bowel Movement Last Bowel Movement Date: 03/04/16   Glucose Checks [x]  N/A  []  AC/HS  []  Q6  Concerns:   Nutrition Active Orders   Diet    DIET RENAL Regular       Consults [] PT  [] OT  [] Speech  [x] Case Management   Cardiac Monitoring [x] N/A [] Yes Expires:

## 2016-03-05 NOTE — Other (Signed)
[facility] [place]  [phone]    Vitals   Pre   Post   Assessment   Pre   Post     Temp  Temp: 97.7 ??F (36.5 ??C) (03/05/16 2127)  98.4 LOC  Alert and Oriented x 3 Alert and Oriented x 3   HR   90 103 Lungs   Even and Unlabored  Even and Unlabored   B/P   162/94 195/103 Cardiac   S1 S2  S1 S2   Resp   18 17 Skin   Dry and Intact  Dry and Intact   Pain level  Pain Intensity 1: 10 (03/05/16 1921) 10/10  Edema  +2 Bilateral Lower Extremities     +2 Bilateral Lower Extremities   Orders:    Duration:   Start:    End:    Total:      Dialyzer:   Dialyzer/Set Up Inspection: Revaclear (03/05/16 2127)   K Bath:   Dialysate K (mEq/L): 2 (03/05/16 2127)   Ca Bath:   Dialysate CA (mEq/L): 2.5 (03/05/16 2127)   Na/Bicarb:   Dialysate NA (mEq/L): 140 (03/05/16 2127)   Target Fluid Removal:   Goal/Amount of Fluid to Remove (mL): 2500 mL (03/04/16 0531)   Access     Type & Location:   Left subclavian [place]/permcath disinfected with Alcohol per policy.  Each lumen aspirated for blood return and flushed with Normal Saline per policy.  Dialysis initiated.     Labs     Obtained/Reviewed   Critical Results Called   Date when labs were drawn-  Hgb-    HGB   Date Value Ref Range Status   03/03/2016 8.0 (L) 12.1 - 17.0 g/dL Final     K-    Potassium   Date Value Ref Range Status   03/03/2016 5.7 (H) 3.5 - 5.1 mmol/L Final     Ca-   Calcium   Date Value Ref Range Status   03/03/2016 6.7 (L) 8.5 - 10.1 MG/DL Final     Bun-   BUN   Date Value Ref Range Status   03/03/2016 49 (H) 6 - 20 MG/DL Final     Creat-   Creatinine   Date Value Ref Range Status   03/03/2016 9.41 (H) 0.70 - 1.30 MG/DL Final        Medications/ Blood Products Given     Name   Dose   Route and Time     Hectorol 2 mcg.  IV @ 0009             Blood Volume Processed (BVP):    56 Net Fluid   Removed:  2500 ml.   Comments   Time Out Done: Yes @ 2030  Primary Nurse Rpt Pre: Raymond GurneyMarietah Kilundo RN  Primary Nurse Rpt Post: Raymond GurneyMarietah Kilundo RN   Pt Education: Procedural  Care Plan:  Tx Summary: Patient tolerated treatment well. At the end of treatment, all possible blood returned with NS 0.9%.  Catheter ports flushed with 10 ml. NS 0.9%.  Instilled with NS 0.9% to dwell.  Secured with red caps.     Report given to Raymond GurneyMarietah Kilundo RN.-------------------Stacey Eulah PontMurphy RN.  Admiting Diagnosis:  Pt's previous clinic-  Consent signed - Informed Consent Verified: Yes (03/05/16 2127)  [facility] Consent -   Hepatitis Status- Negative  Machine #- Machine Number: [id number] (03/05/16 2127)  Telemetry status-  Pre-dialysis wt.- Pre-Dialysis Weight: 114 kg (251 lb 5.2 oz) (03/05/16 2127)

## 2016-03-05 NOTE — Other (Signed)
Attempted to start dialysis at this time when patient stated that he wanted to eat breakfast first. Patient informed that it will take a few minutes to set up so hopefully breakfast will come and he can eat before treatment starts. Patient then asked if I had other patient's to run today because he would prefer to be run later on in the day. Patient informed that he can be run later but I will not be able to tell him exactly what time he will receive dialysis since I may not be the nurse that will be giving the treatment. Patient verbalized understanding stating "that is fine, I want to eat first". Primary nurse Chelsea informed about pt's request and either myself or another dialysis nurse will be back to do his dialysis.

## 2016-03-05 NOTE — Progress Notes (Signed)
Pt states that he has "appealed his discharge" and that he will not leave today. HD is to perform HD shortly per HD nurse.

## 2016-03-05 NOTE — Progress Notes (Addendum)
11:15 - Attempted to see patient this am. When entering the room, the patient was standing next to the bed on the phone. When asked if he wished for me to come back, he shook his head inagreeance. Will reassess situation when patient is available and willing.       14:00 - Attempted to see patient again this afternoon, but he was not in his room as he was at dialysis.

## 2016-03-05 NOTE — Discharge Summary (Signed)
[facility]      Patient: [name]  Admission Date: 01/26/2016   DOB: [birth year]  Discharge Date: 02/06/2016    MRN: [id number]  Discharge Attending: Jerl Mina MD   Referring Physician: Christa See, MD  PCP: Christa See, MD       DISCHARGE SUMMARY     Discharge Information   Discharge Diagnoses:   Principal Problem:    ESRD needing dialysis  Active Problems:    Hypertension    Drug-seeking behavior    Cardiomyopathy    Clostridium difficile infection    Psychological factors affecting medical condition       Discharge Medications:     Medication List      START taking these medications    calcitriol 0.5 MCG capsule  Commonly known as:  ROCALTROL  Take 1 capsule (0.5 mcg total) by mouth every mon, wed and fri.     vancomycin 50 mg/mL Soln  Take 5 mLs (250 mg total) by mouth every 6 (six) hours.5ml (250mg ) PO Q6h For 7 days then 2.56ml (125mg ) PO q6h for 10 days then stop        CONTINUE taking these medications    acetaminophen 325 MG tablet  Commonly known as:  TYLENOL  Take 2 tablets (650 mg total) by mouth every 4 (four) hours as needed for Pain.     albuterol (2.5 MG/3ML) 0.083% nebulizer solution  Commonly known as:  PROVENTIL     albuterol-ipratropium 20-100 MCG/ACT Aers  Commonly known as:  COMBIVENT RESPIMAT     amLODIPine 10 MG tablet  Commonly known as:  NORVASC     carvedilol 25 MG tablet  Commonly known as:  COREG     furosemide 80 MG tablet  Commonly known as:  LASIX     hydrALAZINE 100 MG tablet  Commonly known as:  APRESOLINE     sevelamer 800 MG tablet  Commonly known as:  RENVELA        STOP taking these medications    calcium carbonate 500 MG chewable tablet  Commonly known as:  TUMS     pantoprazole 40 MG tablet  Commonly known as:  PROTONIX           Where to Get Your Medications      You can get these medications from any pharmacy    Bring a paper prescription for each of these medications   calcitriol 0.5 MCG capsule   vancomycin 50 mg/mL [facility]  Course   History of Present Illness and Hospital Course (11 Days)     [name] is a  45 y.o.M with PMH of , ESRD  on hemodialysis over the past 7 years,  NON COMOPLIANT with TREATMENT,  anemia,   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  Nephrology consulted ; started on HD   Pt has perma cath .  ID consulted     Patient continued to have same behavior as above; Refusing hemodialysis total point where he had reaccumulated ascites and requiring paracentesis which yielded 5.1 L; he again continued to have been refusing hemodialysis  afterwards despite the fact that the abdominal distention is relieved. He was started on Dilaudid by mouth as planned.    HD chair fouind by CM, Pt clinically stable so d/c home  Pt refused BP meds prior to d/c    I have concerns for drug seeking behavior       Procedures/Imaging   XR Abdomen Portable   Final Result   No bowel dilatation or obstruction.      ReadingStation:[email or web address]      US Paracentesis   Final Result   Successful ultrasound guided drainage catheter insertion and subsequent large volume paracentesis.      ReadingStation:[email or web address]      CT SOFT TISSUE NECK WO CONTRAST   Final Result   No enlargement of the adenoids or tonsils identified.      Scattered small nodes throughout the neck most prominent in the right supraclavicular region. Findings probably represent reactive change.      Collateral vessels within the neck, which may related to underlying venous occlusions.      ReadingStation:[email or web address]      US ABDOMEN/PELVIS ART/VEIN VISCERAL DUPLEX DOPP COMP   Final Result      US ABDOMEN LIMITED SINGLE ORGAN   Final Result   FINDINGS/IMPRESSION:   LARGE ASCITES;  LARGEST POCKET RLQ.      ReadingStation:[email or web address]      XR Chest 2 Views   Final Result   1.  NO SIGNIFICANT CHANGE FROM 5 DAYS PRIOR.   2.  PERSISTENT SMALL EFFUSIONS AND MILD PULMONARY EDEMA.   3.  DEVELOPING RIGHT LUNG BASE CONSOLIDATION NOT EXCLUDED, THOUGH FELT DUE TO EDEMA.      ReadingStation:[email or web address]      XR Abdomen Portable   Final Result   FINDINGS/IMPRESSION:   1.  PAUCITY OF BOWEL GAS SEEN; CONSISTENT WITH FLUID-FILLED LARGE AND SMALL BOWEL LOOPS AS ON PRIOR CT.   2.  HAZINESS CONSISTENT WITH ASCITES.   3.  NO ABNORMAL ABDOMINAL CALCIFICATIONS.   4.  NO ACUTE BONY ABNORMALITY.      ReadingStation:[email or web address]      US ABDOMEN LIMITED RUQ   Final Result      1. Heterogeneous liver parenchyma likely related to underlying nonspecific medical liver disease.      2. Small volume of abdominal ascites with the largest pocket of fluid in the right upper quadrant.      3. Gallbladder absent.      4. Atrophic echogenic right kidney corresponding to chronic medical renal disease.            ReadingStation:[email or web address]          Treatment Team:   Consulting Physician: Versie Starks, MD  Consulting Physician: Jefferson Fuel, MD  Consulting Physician: Loma Sousa, DO         Progress Note/Physical Exam at Discharge     Subjective: asking for pain meds    Vitals:    02/06/16 1500 02/06/16 1530 02/06/16 1600 02/06/16 1610   BP: (!) 190/107 (!) 178/105 (!) 193/105 (!) 180/101   Pulse: (!) 101 95 95 100   Resp:    18   Temp:       TempSrc:       SpO2:       Weight:       Height:           General: moderately built,  Sleeping, arousable and hypaoctive, asked for   pain meds and went back to sleep, non cooperative  Psychiatry: Patient  is non cooperative  HEENT: neck is supple, no palpable mass  Chest: CTA bilaterally. No crackles or wheezing. No accessory muscle use   CVS: S1, S2 normal, RRR, no thrill   Abdomen: soft dsitended, nontender,no rebound or guarding, hypoactiv eBS  Musculoskeletal: No pitting edema, no  cyanosis/clubbing. Expected ROM all 4 extremities. No joint swelling        Diagnostics     Labs/Studies Pending at Discharge: No    Last Labs         Invalid input(s): ADIFF, REFLX, CANCL, BAND, ABAND           Patient Instructions   Discharge Diet: renal diet  Discharge Activity:  activity as tolerated    Follow Up Appointment:  Follow-up Information     Pcp, Noneorunknown, MD .           [facility]. Schedule an appointment as soon as possible for a visit in 1 week(s).    Specialty:  Internal Medicine  Why:  Thursday February 12, 2016 at 9:15am  Contact information:  [address]  [place] [address]  [phone]                  Time spent examining patient, discussing with patient/family regarding hospital course, chart review, reconciling medications and discharge planning: 45 minutes.    Milinda Antis   10:53 AM 03/05/2016     Cc: PCP and all consultants on listed above

## 2016-03-06 MED FILL — BD POSIFLUSH NORMAL SALINE 0.9 % INJECTION SYRINGE: INTRAMUSCULAR | Qty: 30

## 2016-03-06 MED FILL — BD POSIFLUSH NORMAL SALINE 0.9 % INJECTION SYRINGE: INTRAMUSCULAR | Qty: 10

## 2016-03-06 MED FILL — RENAGEL 800 MG TABLET: 800 mg | ORAL | Qty: 1

## 2016-03-06 MED FILL — PSYLLIUM HUSK (ASPARTAME) 3.4 GRAM ORAL POWDER PACKET: 3.4 gram | ORAL | Qty: 1

## 2016-03-06 MED FILL — ONDANSETRON (PF) 4 MG/2 ML INJECTION: 4 mg/2 mL | INTRAMUSCULAR | Qty: 2

## 2016-03-06 MED FILL — VANCOMYCIN ORAL SOLUTION 50 MG/ML CPD (RX COMPOUNDED): 50 mg/mL | ORAL | Qty: 5

## 2016-03-06 MED FILL — HYDROMORPHONE (PF) 1 MG/ML IJ SOLN: 1 mg/mL | INTRAMUSCULAR | Qty: 2

## 2016-03-06 MED FILL — CLONIDINE 0.1 MG TAB: 0.1 mg | ORAL | Qty: 1

## 2016-03-06 MED FILL — RISAQUAD 8 BILLION CELL CAPSULE: 8 billion cell | ORAL | Qty: 1

## 2016-03-06 MED FILL — DOXERCALCIFEROL 4 MCG/2 ML IV SOLN: 4 mcg/2 mL | INTRAVENOUS | Qty: 2

## 2016-03-06 MED FILL — CARVEDILOL 12.5 MG TAB: 12.5 mg | ORAL | Qty: 1

## 2016-03-06 MED FILL — SENSIPAR 30 MG TABLET: 30 mg | ORAL | Qty: 1

## 2016-03-06 MED FILL — VENTOLIN HFA 90 MCG/ACTUATION AEROSOL INHALER: 90 mcg/actuation | RESPIRATORY_TRACT | Qty: 8

## 2016-03-06 MED FILL — VELTASSA 8.4 GRAM ORAL POWDER PACKET: 8.4 gram | ORAL | Qty: 1

## 2016-03-06 MED FILL — AMLODIPINE 5 MG TAB: 5 mg | ORAL | Qty: 2

## 2016-03-06 NOTE — Progress Notes (Signed)
Verbal shift change report given to Holiday representativeMarietah RN (oncoming nurse) by Jeryl ColumbiaMichele Gordon RN (offgoing nurse). Report included the following information SBAR, Kardex, Intake/Output, MAR and Recent Results.    Zone Phone for oncoming shift:   [phone]    Shift Summary: VS stable. [facility].    LDAs               Peripheral IV 02/29/16 Right Other(comment) (Active)   Site Assessment Clean, dry, & intact 03/06/2016  4:48 PM   Phlebitis Assessment 0 03/06/2016  4:48 PM   Infiltration Assessment 0 03/06/2016  4:48 PM   Dressing Status Clean, dry, & intact 03/06/2016  4:48 PM   Dressing Type Transparent 03/06/2016  4:48 PM   Hub Color/Line Status Pink;Capped 03/06/2016  4:48 PM        Hemodialysis Catheter (Active)   Central Line Being Utilized Yes 03/06/2016  4:48 PM   Criteria for Appropriate Use Dialysis/apheresis 03/06/2016  4:48 PM   Site Assessment Clean, dry, & intact 03/06/2016  4:48 PM   Date of Last Dressing Change 03/04/16 03/06/2016  4:48 PM   Dressing Status Clean, dry, & intact 03/06/2016  4:48 PM   Dressing Type Disk with Chlorhexadine gluconate (CHG);Transparent 03/06/2016  4:48 PM   Proximal Hub Color/Line Status Blue;Capped 03/06/2016  4:48 PM   Distal Hub Color/Line Status Red;Capped 03/06/2016  4:48 PM                    Intake & Output   Date 03/05/16 1900 - 03/06/16 0659 03/06/16 0700 - 03/07/16 0659   Shift 1900-0659 24 Hour Total 0700-1859 1900-0659 24 Hour Total   I  N  T  A  K  E   P.O.  480         P.O.  480       Shift Total  (mL/kg)  480  (4.4)      O  U  T  P  U  T   Dialysis 2500 2500         NET Fluid Removed (mL) 2500 2500       Shift Total  (mL/kg) 2500  (22.9) 2500  (22.9)      NET -2500 -2020      Weight (kg) 109.1 109.1 109.1 109.1 109.1      Last Bowel Movement Last Bowel Movement Date: 03/05/16   Glucose Checks [x]  N/A  []  AC/HS  []  Q6  Concerns:   Nutrition Active Orders   Diet    DIET RENAL Regular       Consults [] PT  [] OT  [] Speech  [] Case Management    Cardiac Monitoring [x] N/A [] Yes Expires:

## 2016-03-06 NOTE — Progress Notes (Signed)
Bedside and Verbal shift change report given to Elon JesterMichele RN (oncoming nurse) by Harle StanfordMarietah RN (offgoing nurse). Report included the following information SBAR, Kardex, Intake/Output, MAR, Accordion and Med Rec Status.    Zone Phone for oncoming shift:   [phone]    Shift Summary: Pt   had dialysis which finished at around 0040. He  appealed discharge,  night  Supervisor informed by the  Night charge nurse Eryn He received pain med  [facility]    LDAs               Peripheral IV 02/29/16 Right Other(comment) (Active)   Site Assessment Clean, dry, & intact 03/05/2016  7:40 PM   Phlebitis Assessment 0 03/05/2016  7:40 PM   Infiltration Assessment 0 03/05/2016  7:40 PM   Dressing Status Clean, dry, & intact 03/05/2016  7:40 PM   Dressing Type Tape;Transparent 03/05/2016  7:40 PM   Hub Color/Line Status Pink;Capped 03/05/2016  7:40 PM        Hemodialysis Catheter (Active)   Central Line Being Utilized Yes 03/05/2016  7:40 PM   Criteria for Appropriate Use Dialysis/apheresis 03/05/2016  7:40 PM   Site Assessment Clean, dry, & intact 03/05/2016  7:40 PM   Date of Last Dressing Change 03/01/16 03/05/2016  7:40 PM   Dressing Status Clean, dry, & intact 03/05/2016  7:40 PM   Dressing Type Disk with Chlorhexadine gluconate (CHG) 03/05/2016  7:40 PM   Proximal Hub Color/Line Status Blue;Capped 03/05/2016  7:40 PM   Distal Hub Color/Line Status Red;Capped 03/05/2016  7:40 PM                    Intake & Output   Date 03/05/16 0700 - 03/06/16 0659 03/06/16 0700 - 03/07/16 0659   Shift 0700-1859 1900-0659 24 Hour Total 0700-1859 1900-0659 24 Hour Total   I  N  T  A  K  E   P.O. 480  480         P.O. 480  480       Shift Total  (mL/kg) 480  (4.3)  480  (4.4)      O  U  T  P  U  T   Dialysis  2500 2500         NET Fluid Removed (mL)  2500 2500       Shift Total  (mL/kg)  2500  (22.9) 2500  (22.9)      NET 480 -2500 -2020      Weight (kg) 112.8 109.1 109.1 109.1 109.1 109.1      Last Bowel Movement Last Bowel Movement Date: 03/04/16    Glucose Checks []  N/A  []  AC/HS  []  Q6  Concerns:   Nutrition Active Orders   Diet    DIET RENAL Regular       Consults [] PT  [] OT  [] Speech  [] Case Management   Cardiac Monitoring [] N/A [] Yes Expires:

## 2016-03-06 NOTE — Progress Notes (Signed)
Paged Dr. Darlina RumpfGolla and informed him of reason patient was still in hospital; patient appealed discharge and did not finish HD until close to 1 am.

## 2016-03-06 NOTE — Progress Notes (Signed)
Nutrition Screen  LOS: 6    Assessment:  Patient screened per protocol; at low to moderate nutrition risk. RD to complete full assessment in 3-6 days.     Diet Order: Renal  % Eaten:      Past Medical History:   Diagnosis Date   ??? Ascites    ??? Asthma    ??? C. difficile colitis    ??? CKD (chronic kidney disease) stage V requiring chronic dialysis (HCC)    ??? HTN (hypertension)    ??? SBP (spontaneous bacterial peritonitis) (HCC)        Wt Readings from Last 30 Encounters:   03/06/16 109.1 kg (240 lb 8.4 oz)   02/12/16 99.1 kg (218 lb 7.6 oz)   01/15/16 106.6 kg (235 lb)   11/21/15 101.2 kg (223 lb)   10/17/15 114.7 kg (252 lb 12.8 oz)       Anthropometrics:   Height: 6\' 1"  (185.4 cm) Weight: 109.1 kg (240 lb 8.4 oz)     BMI: Body mass index is 31.73 kg/(m^2).    This BMI is indicative of:   [] Underweight    [] Normal    [] Overweight    [x]  Obesity   []  Extreme Obesity (BMI>40)       Faylene KurtzKori George, RD  Pager: [phone]

## 2016-03-06 NOTE — Progress Notes (Signed)
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 Will need follow as out pt.      ESRD on HD MWF for 7 years, missed 11/10 session  Hyperkalemia  LUE swelling  - no peaked T waves on EKG  -Given hyperkalemia cocktail by ED  -AVF LUE is not working as per the pt, which is why he has a L chest catheter  -LUE [place] pending shows patent veins and being evaluated by Dr Manson Passey  -Dr Manson Passey has given options for the pt as below for management of left arm swelling    1. Ligate LUE AVF which will resolve arm swelling but leave him with only catheter access.   ????2. Begin to cannulate AVF again (no one has tried since July) and if successful remove catheter and treat central stenosis with balloon angioplasty at same time.  -pt wants the procedure on in pt side but Dr Manson Passey recommends doing it on the out pt side.   -Since he wants the procedure done on the in pt side I will notify Dr Manson Passey about this and ask for his opinion.    Recurrent Ascites unclear cause needs out pt follow up  -CT AP noted, no evidence of cirrhosis - pt denies EtOH use in the past 10 years   -States that he has been worked up for this in the past and it is not because of cirrhosis  -SAAG is less than  1.1 so protal HTN is unlikely and Lft's are normal   -Echo shows normal EF and no diastolic dysfunction. He does not make any urine to look for urine protein   -will need additional ascitis fluid studies sent with next tap including  Fluid AFB smear, culture, CEA, AFP, cytology with next fluid draw   -will need out pt f/u with his primary gastroenterologist.      Peritoneal fluid cell count 238 with concern for peritonitis  likey resolving peritonitis and no need for rx per ID and renal.    Suspected viral URTI resolved   -lung exam is benign.  -no fever or leucocytosis  -CXR shows improvement of pulmonary infilterates    Hypertension, controlled  -BP stable   -on clonidine,coreg and norvasc.    Hx of Hepatitis B  -needs out pt f/u on discharge          Body mass index is 31.73 kg/(m^2).    Code status: Full  Prophylaxis: SCD as pt is ambulatory.  Recommended Disposition: Home w/Family in  1-2 days based on Dr Theora Gianotti recommendations.     Subjective:     Chief Complaint / Reason for Physician Visit  Pt appealed the discharge yesterday and does not want to leave the hospital until left arm swelling is addressed.      Review of Systems:  Symptom Y/N Comments  Symptom Y/N  Comments   Fever/Chills    Chest Pain n    Poor Appetite n   Edema     Cough    Abdominal Pain n    Sputum    Joint Pain y Left arm swelling    SOB/DOE n   Pruritis/Rash     Nausea/vomit n   Tolerating PT/OT     Diarrhea n   Tolerating Diet y    Constipation n   Other       Could NOT obtain due to:      Objective:     VITALS:   Last 24hrs VS reviewed since prior progress note. Most recent are:  Patient Vitals for the past 24 hrs:   Temp Pulse Resp BP SpO2   03/06/16 0752 98 ??F (36.7 ??C) 92 18 (!) 155/95 96 %   03/06/16 0054 - (!) 102 - (!) 161/98 -   03/06/16 0021 - (!) 106 - (!) 194/113 -   03/06/16 0000 - (!) 106 - (!) 192/111 -   03/05/16 2345 - (!) 102 - - -   03/05/16 2330 - (!) 106 17 (!) 159/104 -   03/05/16 2315 - (!) 103 17 (!) 167/98 -   03/05/16 2300 - (!) 101 18 (!) 183/96 -   03/05/16 2245 - 98 18 (!) 153/93 -   03/05/16 2230 - 100 18 (!) 162/105 -   03/05/16 2215 - 97 18 (!) 164/93 -    03/05/16 2200 - (!) 102 18 (!) 160/102 -   03/05/16 2145 - 93 18 (!) 168/107 -   03/05/16 2127 97.7 ??F (36.5 ??C) 98 18 (!) 168/95 -   03/05/16 1443 97.8 ??F (36.6 ??C) 99 18 (!) 148/93 95 %       Intake/Output Summary (Last 24 hours) at 03/06/16 1431  Last data filed at 03/06/16 0021   Gross per 24 hour   Intake              480 ml   Output             2500 ml   Net            -2020 ml        PHYSICAL EXAM:  General: cooperative, no acute distress????  EENT:  EOMI. Anicteric sclerae. MMM  Resp:  B/l air entry +, basal crackles +  CV:  Regular  rhythm,?? No edema  GI:  Soft, non tender, distended, BS+, dressing + over paracentesis site   Neurologic:?? Alert and oriented X 3, normal speech, no focal deficits  Psych:???? Fair insight.??Not anxious nor agitated  Skin:  No rashes.  No jaundice.  MSK:  LUE swelling, radial pulse +     Reviewed most current lab test results and cultures  YES  Reviewed most current radiology test results   YES  Review and summation of old records today    NO  Reviewed patient's current orders and MAR    YES  PMH/SH reviewed - no change compared to H&P      Current Facility-Administered Medications:   ???  psyllium husk-aspartame (METAMUCIL FIBER) packet 1 Packet, 1 Packet, Oral, BID, Sandy Salaam, MD, 1 Packet at 03/06/16 1005  ???  vancomycin 50 mg/mL oral solution (compounded) 250 mg, 250 mg, Oral, QID, Richardean Chimera, MD, 250 mg at 03/06/16 1337  ???  [START ON 03/13/2016] vancomycin 50 mg/mL oral solution (compounded) 250 mg, 250 mg, Oral, BID, Richardean Chimera, MD  ???  [  START ON 03/20/2016] vancomycin 50 mg/mL oral solution (compounded) 125 mg, 125 mg, Oral, DAILY, Richardean Chimeraiane S Sinnatamby, MD  ???  [START ON 03/27/2016] vancomycin 50 mg/mL oral solution (compounded) 125 mg, 125 mg, Oral, EVERY OTHER DAY, Richardean Chimeraiane S Sinnatamby, MD  ???  [START ON 04/03/2016] vancomycin 50 mg/mL oral solution (compounded) 125 mg, 125 mg, Oral, Q3DAYS, Richardean Chimeraiane S Sinnatamby, MD   ???  lactobac ac& pc-s.therm-b.anim (FLORA Q/RISAQUAD), 1 Cap, Oral, DAILY, Vena AustriaVarun K Shakeerah Gradel, MD, 1 Cap at 03/06/16 1006  ???  0.9% sodium chloride infusion, 75 mL/hr, IntraVENous, CONTINUOUS, Lamar SprinklesJonathan G Gaspar, MD, Stopped at 03/04/16 1501  ???  albuterol (PROVENTIL VENTOLIN) nebulizer solution 2.5 mg, 2.5 mg, Nebulization, Q4H PRN, Tanja PortPhillip D Klahr, MD  ???  epoetin alfa (EPOGEN;PROCRIT) injection 10,000 Units, 10,000 Units, SubCUTAneous, Q MON, WED & FRI, Gary Fleetrudy L Rickman, MD  ???  cinacalcet (SENSIPAR) tablet 30 mg, 30 mg, Oral, DAILY WITH BREAKFAST, Gary Fleetrudy L Rickman, MD, 30 mg at 03/06/16 1005  ???  doxercalciferol (HECTOROL) 4 mcg/2 mL injection 2 mcg, 2 mcg, IntraVENous, DIALYSIS MON, WED & FRI, Gary Fleetrudy L Rickman, MD, 2 mcg at 03/06/16 0009  ???  patiromer calcium sorbitex (VELTASSA) powder 8.4 g, 8.4 g, Oral, DAILY, Gary Fleetrudy L Rickman, MD, 8.4 g at 03/05/16 1734  ???  albuterol (PROVENTIL HFA, VENTOLIN HFA, PROAIR HFA) inhaler 2 Puff, 2 Puff, Inhalation, Q6H PRN, Virl DiamondJoanne E Lapetina, MD, 2 Puff at 03/06/16 [phone]  ???  cloNIDine HCl (CATAPRES) tablet 0.1 mg, 0.1 mg, Oral, Q12H, Gary Fleetrudy L Rickman, MD, 0.1 mg at 03/04/16 1200  ???  sodium chloride (NS) flush 5-10 mL, 5-10 mL, IntraVENous, PRN, Edward JollyFloyd D. Providence LaniusHowell, MD, 10 mL at 03/03/16 1925  ???  amLODIPine (NORVASC) tablet 10 mg, 10 mg, Oral, DAILY, Earvin HansenSamip S Dave, MD, 10 mg at 03/04/16 1200  ???  carvedilol (COREG) tablet 12.5 mg, 12.5 mg, Oral, BID WITH MEALS, Earvin HansenSamip S Dave, MD, 12.5 mg at 03/04/16 1807  ???  sevelamer (RENAGEL) tablet 800 mg, 800 mg, Oral, TID WITH MEALS, Earvin HansenSamip S Dave, MD, 800 mg at 03/06/16 1329  ???  sodium chloride (NS) flush 5-10 mL, 5-10 mL, IntraVENous, Q8H, Earvin HansenSamip S Dave, MD, 10 mL at 03/06/16 1333  ???  ondansetron (ZOFRAN) injection 4 mg, 4 mg, IntraVENous, Q4H PRN, Earvin HansenSamip S Dave, MD, 4 mg at 03/05/16 1923  ???  HYDROmorphone (PF) (DILAUDID) injection 2 mg, 2 mg, IntraVENous, Q3H PRN, Virl DiamondJoanne E Lapetina, MD, 2 mg at 03/06/16 1329     ________________________________________________________________________  Care Plan discussed with:    Comments   Patient y    Family      RN y    Care Manager     Consultant  y Dr Mora Applickman                      Multidiciplinary team rounds were held today with case manager, nursing, pharmacist and clinical coordinator.  Patient's plan of care was discussed; medications were reviewed and discharge planning was addressed.     ________________________________________________________________________  Total NON critical care TIME:  25   Minutes    Total CRITICAL CARE TIME Spent:   Minutes non procedure based      Comments   >50% of visit spent in counseling and coordination of care y    ________________________________________________________________________  Vena AustriaVarun K Raysha Tilmon, MD     Procedures: see electronic medical records for all procedures/Xrays and details which were not copied into this note but were reviewed  prior to creation of Plan.      LABS:  I reviewed today's most current labs and imaging studies.  Pertinent labs include:  No results for input(s): WBC, HGB, HCT, PLT, HGBEXT, HCTEXT, PLTEXT, HGBEXT, HCTEXT, PLTEXT in the last 72 hours.  No results for input(s): NA, K, CL, CO2, GLU, BUN, CREA, CA, MG, PHOS, ALB, TBIL, TBILI, SGOT, ALT, INR in the last 72 hours.    No lab exists for component: [place], INREXT    Signed: Vena AustriaVarun K Terrence Pizana, MD

## 2016-03-07 LAB — METABOLIC PANEL, BASIC
Anion gap: 9 mmol/L (ref 5–15)
BUN/Creatinine ratio: 4 — ABNORMAL LOW (ref 12–20)
BUN: 31 MG/DL — ABNORMAL HIGH (ref 6–20)
CO2: 28 mmol/L (ref 21–32)
Calcium: 7.1 MG/DL — ABNORMAL LOW (ref 8.5–10.1)
Chloride: 103 mmol/L (ref 97–108)
Creatinine: 7.51 MG/DL — ABNORMAL HIGH (ref 0.70–1.30)
GFR est AA: 10 mL/min/{1.73_m2} — ABNORMAL LOW (ref >60)
GFR est non-AA: 8 mL/min/{1.73_m2} — ABNORMAL LOW (ref >60)
Glucose: 102 mg/dL — ABNORMAL HIGH (ref 65–100)
Potassium: 4.3 mmol/L (ref 3.5–5.1)
Sodium: 140 mmol/L (ref 136–145)

## 2016-03-07 LAB — MAGNESIUM: Magnesium: 1.9 mg/dL (ref 1.6–2.4)

## 2016-03-07 LAB — CBC W/O DIFF
HCT: 23.8 % — ABNORMAL LOW (ref 36.6–50.3)
HGB: 7.4 g/dL — ABNORMAL LOW (ref 12.1–17.0)
MCH: 29.5 PG (ref 26.0–34.0)
MCHC: 31.1 g/dL (ref 30.0–36.5)
MCV: 94.8 FL (ref 80.0–99.0)
PLATELET: 131 10*3/uL — ABNORMAL LOW (ref 150–400)
RBC: 2.51 M/uL — ABNORMAL LOW (ref 4.10–5.70)
RDW: 18.2 % — ABNORMAL HIGH (ref 11.5–14.5)
WBC: 4.5 10*3/uL (ref 4.1–11.1)

## 2016-03-07 LAB — PHOSPHORUS: Phosphorus: 5.2 MG/DL — ABNORMAL HIGH (ref 2.6–4.7)

## 2016-03-07 MED FILL — PSYLLIUM HUSK (ASPARTAME) 3.4 GRAM ORAL POWDER PACKET: 3.4 gram | ORAL | Qty: 1

## 2016-03-07 MED FILL — HYDROMORPHONE (PF) 1 MG/ML IJ SOLN: 1 mg/mL | INTRAMUSCULAR | Qty: 2

## 2016-03-07 MED FILL — AMLODIPINE 5 MG TAB: 5 mg | ORAL | Qty: 2

## 2016-03-07 MED FILL — RENAGEL 800 MG TABLET: 800 mg | ORAL | Qty: 1

## 2016-03-07 MED FILL — CARVEDILOL 12.5 MG TAB: 12.5 mg | ORAL | Qty: 1

## 2016-03-07 MED FILL — BD POSIFLUSH NORMAL SALINE 0.9 % INJECTION SYRINGE: INTRAMUSCULAR | Qty: 10

## 2016-03-07 MED FILL — VANCOMYCIN ORAL SOLUTION 50 MG/ML CPD (RX COMPOUNDED): 50 mg/mL | ORAL | Qty: 5

## 2016-03-07 MED FILL — RISAQUAD 8 BILLION CELL CAPSULE: 8 billion cell | ORAL | Qty: 1

## 2016-03-07 MED FILL — SENSIPAR 30 MG TABLET: 30 mg | ORAL | Qty: 1

## 2016-03-07 MED FILL — CLONIDINE 0.1 MG TAB: 0.1 mg | ORAL | Qty: 1

## 2016-03-07 MED FILL — VELTASSA 8.4 GRAM ORAL POWDER PACKET: 8.4 gram | ORAL | Qty: 1

## 2016-03-07 NOTE — Progress Notes (Signed)
Hospitalist Progress Note  NAME: David [name]   DOB:  03/28/1971   MRN:  [id number]       Assessment / Plan:  Recurrent C diff  (10 th episode per pt )  -Pt with abdominal pain, nausea, vomiting, and decreased PO intake on presentation   -c diff is positive   - on enteric precautions  -Last abx use was last month and he supposedly received Flagyl and Vanco  -FMT was aborted  due to incomplete colon prep and there were no pseudomembranes GI questioned the need for the procedure give his compliance to medications and colon prep  -HIV non reactive   -ID recommends po vancomycin taper and on it. Will need follow as out pt.      ESRD on HD MWF for 7 years, missed 11/10 session  Hyperkalemia  LUE swelling  - no peaked T waves on EKG  -Given hyperkalemia cocktail by ED  -AVF LUE is not working as per the pt, which is why he has a L chest catheter  -LUE [place] pending shows patent veins and being evaluated by Dr Manson Passey  -Dr Manson Passey has given options for the pt as below for management of left arm swelling    1. Ligate LUE AVF which will resolve arm swelling but leave him with only catheter access.   ????2. Begin to cannulate AVF again (no one has tried since July) and if successful remove catheter and treat central stenosis with balloon angioplasty at same time.  -pt wants the procedure on in pt side but Dr Manson Passey recommends doing it on the out pt side.   -Since he wants the procedure done on the in pt side I will notify Dr Manson Passey about this and ask for his opinion. I will call him Monday.  - Pain control with dilaudid.  - follow labs     Recurrent Ascites unclear cause needs out pt follow up  -CT AP noted, no evidence of cirrhosis - pt denies EtOH use in the past 10 years   -States that he has been worked up for this in the past and it is not because of cirrhosis  -SAAG is less than  1.1 so protal HTN is unlikely and Lft's are normal   -Echo shows normal EF and no diastolic dysfunction. He does not make any  urine to look for urine protein  -will need additional ascitis fluid studies sent with next tap including  Fluid AFB smear, culture, CEA, AFP, cytology with next fluid draw   -will need out pt f/u with his primary gastroenterologist.      Peritoneal fluid cell count 238 with concern for peritonitis  likey resolving peritonitis and no need for rx per ID and renal.    Suspected viral URTI resolved   -lung exam is benign.  -no fever or leucocytosis  -CXR shows improvement of pulmonary infilterates    Hypertension, controlled  -BP stable   -on clonidine,coreg and norvasc.    Hx of Hepatitis B  -needs out pt f/u on discharge          Body mass index is 31.7 kg/(m^2).    Code status: Full  Prophylaxis: SCD as pt is ambulatory.  Recommended Disposition: Home w/Family in  1-2 days based on Dr Theora Gianotti recommendations.     Subjective:     Chief Complaint / Reason for Physician Visit  Continues to report left arm pain about 3/10, increased with movements and relieved with dilaudid.  Diarrhea is  better. No abdominal pain.      Review of Systems:  Symptom Y/N Comments  Symptom Y/N Comments   Fever/Chills    Chest Pain n    Poor Appetite n   Edema     Cough    Abdominal Pain n    Sputum    Joint Pain y Left arm swelling  And pain    SOB/DOE n   Pruritis/Rash     Nausea/vomit n   Tolerating PT/OT     Diarrhea n   Tolerating Diet y    Constipation n   Other       Could NOT obtain due to:      Objective:     VITALS:   Last 24hrs VS reviewed since prior progress note. Most recent are:  Patient Vitals for the past 24 hrs:   Temp Pulse Resp BP SpO2   03/07/16 0748 98.2 ??F (36.8 ??C) 99 20 (!) 167/100 94 %   03/07/16 0009 98 ??F (36.7 ??C) 95 18 (!) 175/103 94 %   03/06/16 1552 98 ??F (36.7 ??C) 89 20 158/84 97 %     No intake or output data in the 24 hours ending 03/07/16 1401     PHYSICAL EXAM:  General: cooperative, no acute distress????  EENT:  EOMI. Anicteric sclerae. MMM  Resp:  B/l air entry +, basal crackles +   CV:  Regular  rhythm,?? No edema  GI:  Soft, non tender, distended, BS+, dressing + over paracentesis site   Neurologic:?? Alert and oriented X 3, normal speech, no focal deficits  Psych:???? Fair insight.??Not anxious nor agitated  Skin:  No rashes.  No jaundice.  MSK:  LUE swelling, radial pulse +     Reviewed most current lab test results and cultures  YES  Reviewed most current radiology test results   YES  Review and summation of old records today    NO  Reviewed patient's current orders and MAR    YES  PMH/SH reviewed - no change compared to H&P      Current Facility-Administered Medications:   ???  psyllium husk-aspartame (METAMUCIL FIBER) packet 1 Packet, 1 Packet, Oral, BID, Sandy SalaamAlex R Seamon, MD, 1 Packet at 03/06/16 1822  ???  vancomycin 50 mg/mL oral solution (compounded) 250 mg, 250 mg, Oral, QID, Richardean Chimeraiane S Sinnatamby, MD, 250 mg at 03/07/16 1109  ???  [START ON 03/13/2016] vancomycin 50 mg/mL oral solution (compounded) 250 mg, 250 mg, Oral, BID, Richardean Chimeraiane S Sinnatamby, MD  ???  [START ON 03/20/2016] vancomycin 50 mg/mL oral solution (compounded) 125 mg, 125 mg, Oral, DAILY, Richardean Chimeraiane S Sinnatamby, MD  ???  [START ON 03/27/2016] vancomycin 50 mg/mL oral solution (compounded) 125 mg, 125 mg, Oral, EVERY OTHER DAY, Richardean Chimeraiane S Sinnatamby, MD  ???  [START ON 04/03/2016] vancomycin 50 mg/mL oral solution (compounded) 125 mg, 125 mg, Oral, Q3DAYS, Richardean Chimeraiane S Sinnatamby, MD  ???  lactobac ac& pc-s.therm-b.anim (FLORA Q/RISAQUAD), 1 Cap, Oral, DAILY, Vena AustriaVarun K Maciel Kegg, MD, 1 Cap at 03/07/16 [phone]  ???  albuterol (PROVENTIL VENTOLIN) nebulizer solution 2.5 mg, 2.5 mg, Nebulization, Q4H PRN, Tanja PortPhillip D Klahr, MD  ???  epoetin alfa (EPOGEN;PROCRIT) injection 10,000 Units, 10,000 Units, SubCUTAneous, Q MON, WED & FRI, Gary Fleetrudy L Rickman, MD  ???  cinacalcet (SENSIPAR) tablet 30 mg, 30 mg, Oral, DAILY WITH BREAKFAST, Gary Fleetrudy L Rickman, MD, 30 mg at 03/07/16 [id number]  ???  doxercalciferol (HECTOROL) 4 mcg/2 mL injection 2 mcg, 2 mcg,  IntraVENous, DIALYSIS MON, WED &  Blanch MediaFRI, Trudy L Rickman, MD, 2 mcg at 03/06/16 0009  ???  patiromer calcium sorbitex (VELTASSA) powder 8.4 g, 8.4 g, Oral, DAILY, Gary Fleetrudy L Rickman, MD, 8.4 g at 03/05/16 1734  ???  albuterol (PROVENTIL HFA, VENTOLIN HFA, PROAIR HFA) inhaler 2 Puff, 2 Puff, Inhalation, Q6H PRN, Virl DiamondJoanne E Lapetina, MD, 2 Puff at 03/06/16 1822  ???  cloNIDine HCl (CATAPRES) tablet 0.1 mg, 0.1 mg, Oral, Q12H, Gary Fleetrudy L Rickman, MD, 0.1 mg at 03/04/16 1200  ???  sodium chloride (NS) flush 5-10 mL, 5-10 mL, IntraVENous, PRN, Edward JollyFloyd D. Providence LaniusHowell, MD, 10 mL at 03/07/16 1222  ???  amLODIPine (NORVASC) tablet 10 mg, 10 mg, Oral, DAILY, Earvin HansenSamip S Dave, MD, 10 mg at 03/07/16 [id number]  ???  carvedilol (COREG) tablet 12.5 mg, 12.5 mg, Oral, BID WITH MEALS, Earvin HansenSamip S Dave, MD, 12.5 mg at 03/07/16 [id number]  ???  sevelamer (RENAGEL) tablet 800 mg, 800 mg, Oral, TID WITH MEALS, Earvin HansenSamip S Dave, MD, 800 mg at 03/07/16 1222  ???  sodium chloride (NS) flush 5-10 mL, 5-10 mL, IntraVENous, Q8H, Earvin HansenSamip S Dave, MD, 10 mL at 03/06/16 2128  ???  ondansetron [facility](ZOFRAN) injection 4 mg, 4 mg, IntraVENous, Q4H PRN, Earvin HansenSamip S Dave, MD, 4 mg at 03/05/16 1923  ???  HYDROmorphone (PF) (DILAUDID) injection 2 mg, 2 mg, IntraVENous, Q3H PRN, Virl DiamondJoanne E Lapetina, MD, 2 mg at 03/07/16 1222    ________________________________________________________________________  Care Plan discussed with:    Comments   Patient y    Family      RN y    Buyer, retailCare Manager     Consultant                        Multidiciplinary team rounds were held today with case manager, nursing, pharmacist and Higher education careers adviserclinical coordinator.  Patient's plan of care was discussed; medications were reviewed and discharge planning was addressed.     ________________________________________________________________________  Total NON critical care TIME:  25   Minutes    Total CRITICAL CARE TIME Spent:   Minutes non procedure based      Comments   >50% of visit spent in counseling and coordination of care y     ________________________________________________________________________  Vena AustriaVarun K Malay Fantroy, MD     Procedures: see electronic medical records for all procedures/Xrays and details which were not copied into this note but were reviewed prior to creation of Plan.      LABS:  I reviewed today's most current labs and imaging studies.  Pertinent labs include:  Recent Labs      03/07/16   0936   WBC  4.5   HGB  7.4*   HCT  23.8*   PLT  131*     Recent Labs      03/07/16   0936   NA  140   K  4.3   CL  103   CO2  28   GLU  102*   BUN  31*   CREA  7.51*   CA  7.1*   MG  1.9   PHOS  5.2*       Signed: Vena AustriaVarun K Janina Trafton, MD

## 2016-03-07 NOTE — Progress Notes (Signed)
Renal-labs have been ordered on this patient since the 15th and have been "cancelled"----labs ordered again for tomorrow. Gary Fleetrudy L Oliviya Gilkison, MD

## 2016-03-07 NOTE — Progress Notes (Signed)
Bedside and Verbal shift change report given to Aundra MilletMegan RN (oncoming nurse) by Harle StanfordMarietah RN (offgoing nurse). Report included the following information SBAR, Kardex, Intake/Output, MAR, Accordion, Recent Results and Med Rec Status.    Zone Phone for oncoming shift:       Shift Summary: [facility]    LDAs               Peripheral IV 02/29/16 Right Other(comment) (Active)   Site Assessment Clean, dry, & intact 03/07/2016  3:28 AM   Phlebitis Assessment 0 03/07/2016  3:28 AM   Infiltration Assessment 0 03/07/2016  3:28 AM   Dressing Status Clean, dry, & intact 03/07/2016  3:28 AM   Dressing Type Tape;Transparent 03/07/2016  3:28 AM   Hub Color/Line Status Pink;Capped 03/07/2016  3:28 AM        Hemodialysis Catheter (Active)   Central Line Being Utilized Yes 03/07/2016  3:28 AM   Criteria for Appropriate Use Dialysis/apheresis 03/07/2016  3:28 AM   Site Assessment Clean, dry, & intact 03/07/2016  3:28 AM   Date of Last Dressing Change 03/04/16 03/07/2016  3:28 AM   Dressing Status Clean, dry, & intact 03/07/2016  3:28 AM   Dressing Type Disk with Chlorhexadine gluconate (CHG) 03/07/2016  3:28 AM   Proximal Hub Color/Line Status Blue;Capped 03/07/2016  3:28 AM   Distal Hub Color/Line Status Red;Capped 03/07/2016  3:28 AM                    Intake & Output     Last Bowel Movement Last Bowel Movement Date: 03/05/16   Glucose Checks []  N/A  []  AC/HS  []  Q6  Concerns:   Nutrition Active Orders   Diet    DIET RENAL Regular       Consults [] PT  [] OT  [] Speech  [] Case Management   Cardiac Monitoring [] N/A [] Yes Expires:

## 2016-03-07 NOTE — Progress Notes (Signed)
Bedside shift change report given to Arlys JohnBrian (Cabin crewoncoming nurse) by Aundra MilletMegan (offgoing nurse). Report included the following information SBAR, Kardex, MAR.    Zone Phone for oncoming shift:   [phone]    Shift Summary: PRN dilaudid given.     LDAs               Peripheral IV 02/29/16 Right Other(comment) (Active)   Site Assessment Clean, dry, & intact 03/07/2016  3:11 PM   Phlebitis Assessment 0 03/07/2016  3:11 PM   Infiltration Assessment 0 03/07/2016  3:11 PM   Dressing Status Clean, dry, & intact 03/07/2016  3:11 PM   Dressing Type Transparent;Tape 03/07/2016  3:11 PM   Hub Color/Line Status Pink;Flushed;Capped 03/07/2016  3:11 PM        Hemodialysis Catheter (Active)   Central Line Being Utilized Yes 03/07/2016  3:11 PM   Criteria for Appropriate Use Dialysis/apheresis 03/07/2016  3:11 PM   Site Assessment Clean, dry, & intact 03/07/2016  3:11 PM   Date of Last Dressing Change 03/04/16 03/07/2016  3:11 PM   Dressing Status Clean, dry, & intact 03/07/2016  3:11 PM   Dressing Type Disk with Chlorhexadine gluconate (CHG);Transparent 03/07/2016  3:11 PM   Proximal Hub Color/Line Status Blue;Capped 03/07/2016  3:11 PM   Distal Hub Color/Line Status Red;Capped 03/07/2016  3:11 PM                    Intake & Output   Date 03/06/16 0700 - 03/07/16 0659 03/07/16 0700 - 03/08/16 0659   Shift 0700-1859 1900-0659 24 Hour Total 0700-1859 1900-0659 24 Hour Total   I  N  T  A  K  E   P.O.    720  720      P.O.    720  720    Shift Total  (mL/kg)    720  (6.6)  720  (6.6)   O  U  T  P  U  T   Stool            Stool Occurrence(s)    2 x  2 x    Shift Total  (mL/kg)         NET    720  720   Weight (kg) 109.1 109 109 109 109 109      Last Bowel Movement Last Bowel Movement Date: 03/07/16 (pt reported)   Glucose Checks []  N/A  []  AC/HS  []  Q6  Concerns:   Nutrition Active Orders   Diet    DIET RENAL Regular       Consults [] PT  [] OT  [] Speech  [] Case Management   Cardiac Monitoring [] N/A [] Yes Expires:

## 2016-03-08 ENCOUNTER — Inpatient Hospital Stay: Admit: 2016-03-08 | Payer: MEDICARE

## 2016-03-08 LAB — CBC W/O DIFF
HCT: 23.7 % — ABNORMAL LOW (ref 36.6–50.3)
HGB: 7.4 g/dL — ABNORMAL LOW (ref 12.1–17.0)
MCH: 29.4 PG (ref 26.0–34.0)
MCHC: 31.2 g/dL (ref 30.0–36.5)
MCV: 94 FL (ref 80.0–99.0)
PLATELET: 132 10*3/uL — ABNORMAL LOW (ref 150–400)
RBC: 2.52 M/uL — ABNORMAL LOW (ref 4.10–5.70)
RDW: 18.2 % — ABNORMAL HIGH (ref 11.5–14.5)
WBC: 5.1 10*3/uL (ref 4.1–11.1)

## 2016-03-08 LAB — EKG, 12 LEAD, SUBSEQUENT
Atrial Rate: 90 {beats}/min
Calculated P Axis: 65 degrees
Calculated R Axis: 42 degrees
Calculated T Axis: 84 degrees
P-R Interval: 198 ms
Q-T Interval: 388 ms
QRS Duration: 84 ms
QTC Calculation (Bezet): 474 ms
Ventricular Rate: 90 {beats}/min

## 2016-03-08 LAB — CK W/ CKMB & INDEX
CK - MB: 34.2 NG/ML — ABNORMAL HIGH (ref <3.6)
CK - MB: 37.9 NG/ML — ABNORMAL HIGH (ref <3.6)
CK-MB Index: 2.8 — ABNORMAL HIGH (ref 0–2.5)
CK-MB Index: 3 — ABNORMAL HIGH (ref 0–2.5)
CK: 1225 U/L — ABNORMAL HIGH (ref 39–308)
CK: 1279 U/L — ABNORMAL HIGH (ref 39–308)

## 2016-03-08 LAB — TROPONIN I
Troponin-I, Qt.: 0.06 ng/mL — ABNORMAL HIGH (ref <0.05)
Troponin-I, Qt.: 0.06 ng/mL — ABNORMAL HIGH (ref <0.05)

## 2016-03-08 LAB — EKG, 12 LEAD, INITIAL
Atrial Rate: 93 {beats}/min
Calculated P Axis: 54 degrees
Calculated R Axis: 6 degrees
Calculated T Axis: 84 degrees
Diagnosis: NORMAL
P-R Interval: 198 ms
Q-T Interval: 392 ms
QRS Duration: 88 ms
QTC Calculation (Bezet): 487 ms
Ventricular Rate: 93 {beats}/min

## 2016-03-08 LAB — CK W/ REFLX CKMB: CK: 1279 U/L — ABNORMAL HIGH (ref 39–308)

## 2016-03-08 LAB — EKG 12-LEAD
Atrial Rate: 90 {beats}/min
Atrial Rate: 93 {beats}/min
Diagnosis: NORMAL
P Axis: 54 degrees
P Axis: 65 degrees
P-R Interval: 198 ms
P-R Interval: 198 ms
Q-T Interval: 388 ms
Q-T Interval: 392 ms
QRS Duration: 84 ms
QRS Duration: 88 ms
QTc Calculation (Bazett): 474 ms
QTc Calculation (Bazett): 487 ms
R Axis: 42 degrees
R Axis: 6 degrees
T Axis: 84 degrees
T Axis: 84 degrees
Ventricular Rate: 90 {beats}/min
Ventricular Rate: 93 {beats}/min

## 2016-03-08 MED ORDER — NITROGLYCERIN 0.4 MG SUBLINGUAL TAB
0.4 mg | SUBLINGUAL | Status: AC
Start: 2016-03-08 — End: 2016-03-08
  Administered 2016-03-08: 09:00:00

## 2016-03-08 MED ORDER — NITROGLYCERIN 2 % TRANSDERMAL OINTMENT
2 % | Freq: Once | TRANSDERMAL | Status: AC
Start: 2016-03-08 — End: 2016-03-08
  Administered 2016-03-08: 11:00:00 via TOPICAL

## 2016-03-08 MED ORDER — ACETAMINOPHEN 325 MG TABLET
325 mg | Freq: Four times a day (QID) | ORAL | Status: DC | PRN
Start: 2016-03-08 — End: 2016-03-10
  Administered 2016-03-08: 09:00:00 via ORAL

## 2016-03-08 MED ORDER — SODIUM CHLORIDE 0.9 % IJ SYRG
INTRAMUSCULAR | Status: AC
Start: 2016-03-08 — End: 2016-03-08
  Administered 2016-03-09: 03:00:00 via INTRAVENOUS

## 2016-03-08 MED ORDER — ASPIRIN 81 MG CHEWABLE TAB
81 mg | ORAL | Status: AC
Start: 2016-03-08 — End: 2016-03-08
  Administered 2016-03-08: 13:00:00

## 2016-03-08 MED ORDER — REGADENOSON 0.4 MG/5 ML IV SYRINGE
0.4 mg/5 mL | INTRAVENOUS | Status: AC
Start: 2016-03-08 — End: 2016-03-09
  Administered 2016-03-09: 15:00:00 via INTRAVENOUS

## 2016-03-08 MED ORDER — HYDROMORPHONE (PF) 1 MG/ML IJ SOLN
1 mg/mL | Freq: Once | INTRAMUSCULAR | Status: AC
Start: 2016-03-08 — End: 2016-03-08
  Administered 2016-03-08: 13:00:00 via INTRAVENOUS

## 2016-03-08 MED ORDER — ASPIRIN 81 MG CHEWABLE TAB
81 mg | Freq: Every day | ORAL | Status: DC
Start: 2016-03-08 — End: 2016-03-10
  Administered 2016-03-08 – 2016-03-09 (×2): via ORAL

## 2016-03-08 MED ORDER — NITROGLYCERIN 0.4 MG SUBLINGUAL TAB
0.4 mg | SUBLINGUAL | Status: DC | PRN
Start: 2016-03-08 — End: 2016-03-10
  Administered 2016-03-08 (×2): via SUBLINGUAL

## 2016-03-08 MED FILL — PSYLLIUM HUSK (ASPARTAME) 3.4 GRAM ORAL POWDER PACKET: 3.4 gram | ORAL | Qty: 1

## 2016-03-08 MED FILL — SENSIPAR 30 MG TABLET: 30 mg | ORAL | Qty: 1

## 2016-03-08 MED FILL — VANCOMYCIN ORAL SOLUTION 50 MG/ML CPD (RX COMPOUNDED): 50 mg/mL | ORAL | Qty: 5

## 2016-03-08 MED FILL — PROCRIT 10,000 UNIT/ML INJECTION SOLUTION: 10000 unit/mL | INTRAMUSCULAR | Qty: 1

## 2016-03-08 MED FILL — RISAQUAD 8 BILLION CELL CAPSULE: 8 billion cell | ORAL | Qty: 1

## 2016-03-08 MED FILL — HYDROMORPHONE (PF) 1 MG/ML IJ SOLN: 1 mg/mL | INTRAMUSCULAR | Qty: 2

## 2016-03-08 MED FILL — BD POSIFLUSH NORMAL SALINE 0.9 % INJECTION SYRINGE: INTRAMUSCULAR | Qty: 20

## 2016-03-08 MED FILL — AMLODIPINE 5 MG TAB: 5 mg | ORAL | Qty: 2

## 2016-03-08 MED FILL — RENAGEL 800 MG TABLET: 800 mg | ORAL | Qty: 1

## 2016-03-08 MED FILL — NITRO-BID 2 % TRANSDERMAL OINTMENT: 2 % | TRANSDERMAL | Qty: 1

## 2016-03-08 MED FILL — BD POSIFLUSH NORMAL SALINE 0.9 % INJECTION SYRINGE: INTRAMUSCULAR | Qty: 40

## 2016-03-08 MED FILL — MAPAP (ACETAMINOPHEN) 325 MG TABLET: 325 mg | ORAL | Qty: 2

## 2016-03-08 MED FILL — LEXISCAN 0.4 MG/5 ML INTRAVENOUS SYRINGE: 0.4 mg/5 mL | INTRAVENOUS | Qty: 5

## 2016-03-08 MED FILL — NITROSTAT 0.4 MG SUBLINGUAL TABLET: 0.4 mg | SUBLINGUAL | Qty: 1

## 2016-03-08 MED FILL — CARVEDILOL 12.5 MG TAB: 12.5 mg | ORAL | Qty: 1

## 2016-03-08 MED FILL — VELTASSA 8.4 GRAM ORAL POWDER PACKET: 8.4 gram | ORAL | Qty: 1

## 2016-03-08 MED FILL — CHILDREN'S ASPIRIN 81 MG CHEWABLE TABLET: 81 mg | ORAL | Qty: 1

## 2016-03-08 MED FILL — ONDANSETRON (PF) 4 MG/2 ML INJECTION: 4 mg/2 mL | INTRAMUSCULAR | Qty: 2

## 2016-03-08 MED FILL — DOXERCALCIFEROL 4 MCG/2 ML IV SOLN: 4 mcg/2 mL | INTRAVENOUS | Qty: 2

## 2016-03-08 MED FILL — BD POSIFLUSH NORMAL SALINE 0.9 % INJECTION SYRINGE: INTRAMUSCULAR | Qty: 10

## 2016-03-08 NOTE — Progress Notes (Signed)
On Friday,   The pt wanted to switch from [facility] [place] [facility] [place]. ??[facility] [place] the same nephrologist who has denied the pt at both locations. ??[place] also [place]. ??The pt is aware of the above.  IM on chart.  Updates sent to HD center.     ??      ?? ??     Pt plans to go to sister's home in [place] at [place].  [place] lives [place].  [place] son is in [place].  Updates sent to [facility] today.  He has a new PCP app't in [name] with Dr Ree Kidaoopareliya on 11/22 at 2p

## 2016-03-08 NOTE — Consults (Signed)
Pt seen and examined     Full consult dictated    Doubt this is anginal chest pain.     Will order Lexiscan nuclear to risk stratify

## 2016-03-08 NOTE — Consults (Signed)
[facility]   [address]   [place], [place] [id number]   CONSULTATION       Name:  David [name], David D   MR#:  [id number]   DOB:  06-29-1970   Account #:  [id number]    Date of Consultation:  03/08/2016   Date of Adm:  02/29/2016       REQUESTING PHYSICIAN: Vena AustriaVarun K. Golla, MD    REASON FOR CONSULTATION: Evaluate chest pain.    CHIEF COMPLAINT: Chest pain.    HISTORY OF PRESENT ILLNESS: The patient is a 45 year old man   with a history of hypertension, end-stage renal disease and an   apparent nonischemic cardiomyopathy. He was admitted back on   02/29/2016 with multiple complaints including nausea, vomiting, and   diarrhea. He has also had problems with C difficile colitis and is being   evaluated for that problem. He has a history of noncompliance and per   the old records drug-seeking behavior. I was called to see the patient   because of the development of chest pain and shortness of breath this   morning. The patient describes the pain as 10/10 and similar to his   prior heart attack. He says he had a heart attack back in 2010.   Records indicate that he had a catheterization and showed no   significant coronary artery disease. He has undergone stenting   apparently to subclavian vessels. He had an echocardiogram in June   that showed an EF of 55% to 60%. He had an echocardiogram in   August that showed an EF of 35% to 40%. He had a nuclear stress   test in 2015, which was reportedly normal. He has had mild troponin   elevations in the past, which have been felt to be due to his renal   disease.      The patient did have some relief of his symptoms with narcotics. He   says he did not get any relief with nitroglycerin. He is currently off   hemodialysis and continues to complain of chest pain. His EKG shows   no obvious ischemic changes.    PAST MEDICAL HISTORY: As noted above, COPD, recurrent C   difficile colitis, spontaneous bacterial peritonitis.       SURGERY: Prior hernia repair, prior Perm-A-Cath placement, prior AV   fistula which is apparently clotted.    CURRENT MEDICATIONS:   1. Norvasc.   2. Carvedilol.   3. Clonidine.   4. Aspirin.   5. Vancomycin.   6. Veltassa.   7. Renagel.   8. Sensipar.   9. Hectorol.   10. Epogen.   11. Flora-Q.    SOCIAL HISTORY: The patient does have a history of ongoing   tobacco abuse. No alcohol or documented illicit drug abuse.    FAMILY HISTORY: Negative for heart disease per the old chart.    REVIEW OF SYSTEMS: As noted above, otherwise noncontributory.    PHYSICAL EXAMINATION   GENERAL: Reveals a middle-aged [place] male, currently   on hemodialysis, appearing older than his stated age.   VITAL SIGNS: Blood pressure 173/100, pulse 94, respirations 16,   temperature 98.2.   HEENT: Pupils are equal. Oropharynx with moist oral mucosa.   NECK: Supple. No obvious masses.   CHEST: Clear.   SKIN: Warm and dry.   CARDIAC: Regular rate and rhythm. Soft S4, no S3 or rub.   ABDOMEN: Obese, diffuse tenderness. No obvious masses. Bowel  sounds positive.   EXTREMITIES: No cyanosis or clubbing. 1+ edema. 1-2+ pulses in the   feet bilaterally.   NEUROLOGIC: No obvious gross motor deficits.    LABORATORY DATA: Troponin 0.06 x2. Hemoglobin 7.4, platelets   132,000. Potassium 4.3, sodium 142.    EKG: Normal sinus rhythm, PVCs, nonspecific ST-T changes. No   obvious ischemic changes.     CHEST X-RAY: Negative.    IMPRESSION:   1. Chest pain, probably noncardiac in etiology.   2. Hypertension and hypertensive heart disease.   3. History of apparent nonischemic cardiomyopathy, probably due to   hypertension.   4. End-stage renal disease on chronic hemodialysis.   5. Tobacco abuse.   6. Anemia.   7. History of noncompliance and drug-seeking behavior per the old   chart.    RECOMMENDATIONS: At this point, the patient has had ongoing   chest pain and no enzyme or EKG changes of significance. I suspect    this is noncardiac chest pain. I would recommend we risk stratify the   patient with a pharmacologic nuclear stress test because of his   symptoms and risk factors. This can be done tomorrow. If this shows   no ischemia, then we can probably discharge the patient to home   without additional cardiac workup. If he continues to have unrelenting   chest pain; however, we may need to consider repeat coronary   angiography.     Thank you for this consultation.         Jasmine AweBRIAN K. Keigo Whalley, MD      B[facility] / RLD   D:  03/08/2016   10:40   T:  03/08/2016   11:21   Job #:  [id number]

## 2016-03-08 NOTE — Progress Notes (Signed)
Progress Note    NAME: David [name]   DOB:  1970-06-14   MRN:  [id number]     Date/Time:  03/08/2016    Assessment :    Plan:  ESRD- from [place]; MWF  Hyperkalemia-expect to have resolved post HD today  Mild pulmonary edema on cxr  Uncontrolled htn  Perm cath  Failed AV accesses  Anemia of CKD  Swollen arm/hand/central stenosis     HD today on 2 k bath; using L AV access successfully. 400 QB, 2K, 2.5 Ca  UF of 2.5 Kg as tol  SBP in 150-160's    Fecal transplant aborted (again) due to poor prep and no pseudomembranes noted on exam  Since AV access is working- [facility] can be removed and have PTA of central stenosis at same time per vascular surgery as outpt    Pt has been hospital shopping-recently at [facility]  Nephrologists are in [place] for DC after HD today  His poor behavior/compliance issues have led to him not having a dialysis unit-in my humble opinion, he can still be discharged-he can go to the ED in [place] if no unit accepts him    Next HD on Wed,  if still here.      OBJECTIVE:  Gen-NAD  Tr edema  L AV access patent; surrounding edema  L TDC intact  AOx3    LAB DATA REVIEWED:    Recent Labs      03/08/16   0340  03/07/16   0936   WBC  5.1  4.5   HGB  7.4*  7.4*   HCT  23.7*  23.8*   PLT  132*  131*     Recent Labs      03/07/16   0936   NA  140   K  4.3   CL  103   CO2  28   BUN  31*   CREA  7.51*   GLU  102*   CA  7.1*   MG  1.9   PHOS  5.2*     No results for input(s): SGOT, GPT, ALT, AP, TBIL, TBILI, ALB, GLOB, GGT, AML, LPSE in the last 72 hours.    No lab exists for component: AMYP, HLPSE  No results for input(s): INR, PTP, APTT in the last 72 hours.    No lab exists for component: INREXT, INREXT   No results for input(s): FE, TIBC, PSAT, FERR in the last 72 hours.   No results for input(s): PH, PCO2, PO2 in the last 72 hours.  Recent Labs      03/08/16   0714  03/08/16   0340   CPK  1279*  1225*   CKMB  37.9*  34.2*     Lab Results   Component Value Date/Time     Glucose (POC) 96 03/04/2016 02:04 PM    Glucose (POC) 105 02/29/2016 11:05 AM       Procedures: see electronic medical records for all procedures/Xrays and details which were not copied into this note but were reviewed prior to creation of Plan.    ________________________________________________________________________       ___________________________________________________  Consulting Physician: Lindaann Pascal, MD

## 2016-03-08 NOTE — Progress Notes (Signed)
Hospitalist Progress Note  NAME: David [name]   DOB:  21-Jan-1971   MRN:  [id number]       Assessment / Plan:  Chest pain (New episode on 11/19) concern for unstable angina  -he is at high risk given his Esrd  -12 lead ekg shows no stt changes. Vitals are normal.  -troponin elevation is flat and minimal.  -will consult cardiology. cxr is wnl.  -it could be referred pain from left arm.        Recurrent C diff  (10 th episode per pt)  -Pt with abdominal pain, nausea, vomiting, and decreased PO intake on presentation   -c diff is positive   - on enteric precautions  -Last abx use was last month and he supposedly received Flagyl and Vanco  -FMT was aborted  due to incomplete colon prep and there were no pseudomembranes GI questioned the need for the procedure give his compliance to medications and colon prep  -HIV non reactive   -ID recommends po vancomycin taper and on it. Will need follow as out pt with C diff clinic in [facility] per Dr Driscilla Moats.  - pt also had FMT attempted at [facility] but was aborted due to not being compliant with colon prep.      ESRD on HD MWF for 7 years, missed 11/10 session  Hyperkalemia  LUE swelling with central stenosis  - no peaked T waves on EKG  -Given hyperkalemia cocktail by ED  -Dialyzed per schedule while hospitalized by renal   -pt was denied by multiple dialysis centers due to non compliance and now his only option at this time is go to [place]  er for dialysis until he has a place for dialysis per renal. He is planning to go live with his sister in [place].  -AVF LUE is not working as per the pt, which is why he has a L chest catheter  -LUE [place] pending shows patent veins and being evaluated by Dr Manson Passey  -Dr Manson Passey has given options for the pt as below for management of left arm swelling    1. Ligate LUE AVF which will resolve arm swelling but leave him with only catheter access.   ????2. Begin to cannulate AVF again (no one has tried since July) and if  successful remove catheter and treat central stenosis with balloon angioplasty at same time.    -d/w Dr Manson Passey and he will consider doing the procedure tomorrow provided pt will allow the tech to access his AVF again.  -pt has appealed his discharge on Friday stating that he want the procedure to be done on the in pt side    Recurrent Ascites unclear cause needs out pt follow up  -CT AP noted, no evidence of cirrhosis - pt denies EtOH use in the past 10 years   -States that he has been worked up for this in the past and it is not because of cirrhosis  -SAAG is less than  1.1 so protal HTN is unlikely and Lft's are normal   -Echo shows normal EF and no diastolic dysfunction. He does not make any urine to look for urine protein  -will need additional ascitis fluid studies sent with next tap including  Fluid AFB smear, culture, CEA, AFP, cytology with next fluid draw   -will need out pt f/u with his primary gastroenterologist where he claims he had extensive work up including liver biopsy. Multiple attempts at getting records were not successful.  Peritoneal fluid cell count 238 with concern for peritonitis  likey resolving peritonitis and no need for rx per ID and renal.    Suspected viral URTI resolved   -lung exam is benign.  -no fever or leucocytosis  -CXR shows improvement of pulmonary infilterates    Hypertension, controlled  -BP stable   -on clonidine,coreg and norvasc.    Hx of Hepatitis B  -needs out pt f/u on discharge          Body mass index is 32.6 kg/(m^2).    Code status: Full  Prophylaxis: SCD as pt is ambulatory.  Recommended Disposition: Home w/Family after pta of central stenosis.     Subjective:     Chief Complaint / Reason for Physician Visit  Had episode of chest pain today which is dull, 3/10, radiating to left arm. No sob. Reports   Left arm pain 5/10, increased with movements. No diarrhea or abdominal pain.       Review of Systems:  Symptom Y/N Comments  Symptom Y/N Comments    Fever/Chills    Chest Pain y    Poor Appetite n   Edema     Cough    Abdominal Pain n    Sputum    Joint Pain y Left arm swelling  And pain    SOB/DOE n   Pruritis/Rash     Nausea/vomit n   Tolerating PT/OT     Diarrhea n   Tolerating Diet y    Constipation n   Other       Could NOT obtain due to:      Objective:     VITALS:   Last 24hrs VS reviewed since prior progress note. Most recent are:  Patient Vitals for the past 24 hrs:   Temp Pulse Resp BP SpO2   03/08/16 1324 98.3 ??F (36.8 ??C) 100 18 (!) 161/91 98 %   03/08/16 1215 - 89 16 157/78 -   03/08/16 1145 - 87 16 166/74 -   03/08/16 1115 - 93 16 (!) 167/97 -   03/08/16 1045 - 95 16 (!) 183/105 -   03/08/16 1015 - 98 16 (!) 168/93 -   03/08/16 0945 - 94 16 (!) 176/96 -   03/08/16 0915 98.2 ??F (36.8 ??C) 89 16 (!) 173/100 -   03/08/16 0750 - 94 - (!) 198/111 96 %   03/08/16 0710 - 94 - (!) 163/95 100 %   03/08/16 0700 98.2 ??F (36.8 ??C) 93 18 (!) 163/95 99 %   03/08/16 0404 98 ??F (36.7 ??C) 92 17 (!) 154/92 94 %   03/07/16 2350 98.4 ??F (36.9 ??C) 89 15 (!) 149/97 97 %   03/07/16 1706 98.5 ??F (36.9 ??C) 99 18 (!) 174/106 96 %       Intake/Output Summary (Last 24 hours) at 03/08/16 1534  Last data filed at 03/08/16 1215   Gross per 24 hour   Intake              720 ml   Output             1957 ml   Net            -1237 ml        PHYSICAL EXAM:  General: cooperative, no acute distress????  EENT:  EOMI. Anicteric sclerae. MMM  Resp:  B/l air entry +, basal crackles +  CV:  Regular  rhythm,?? No edema  GI:  Soft, non tender, distended, BS+  Neurologic:?? Alert and oriented X 3, normal speech, no focal deficits  Psych:???? Fair insight.??Not anxious nor agitated  Skin:  No rashes.  No jaundice.  MSK:  LUE swelling, radial pulse +     Reviewed most current lab test results and cultures  YES  Reviewed most current radiology test results   YES  Review and summation of old records today    NO  Reviewed patient's current orders and MAR    YES  PMH/SH reviewed - no change compared to H&P       Current Facility-Administered Medications:   ???  nitroglycerin (NITROSTAT) tablet 0.4 mg, 0.4 mg, SubLINGual, PRN, Jory EeJayne H Lee, MD, 0.4 mg at 03/08/16 0349  ???  acetaminophen (TYLENOL) tablet 650 mg, 650 mg, Oral, Q6H PRN, Jory EeJayne H Lee, MD, 650 mg at 03/08/16 0429  ???  aspirin chewable tablet 81 mg, 81 mg, Oral, DAILY, Vena AustriaVarun K Lycia Sachdeva, MD, 81 mg at 03/08/16 0810  ???  aspirin 81 mg chewable tablet, , , ,   ???  sodium chloride (NS) 0.9 % flush, , , ,   ???  regadenoson (LEXISCAN) 0.4 mg/5 mL injection, , , ,   ???  psyllium husk-aspartame (METAMUCIL FIBER) packet 1 Packet, 1 Packet, Oral, BID, Sandy SalaamAlex R Seamon, MD, 1 Packet at 03/08/16 1319  ???  vancomycin 50 mg/mL oral solution (compounded) 250 mg, 250 mg, Oral, QID, Richardean Chimeraiane S Sinnatamby, MD, 250 mg at 03/08/16 1319  ???  [START ON 03/13/2016] vancomycin 50 mg/mL oral solution (compounded) 250 mg, 250 mg, Oral, BID, Richardean Chimeraiane S Sinnatamby, MD  ???  [START ON 03/20/2016] vancomycin 50 mg/mL oral solution (compounded) 125 mg, 125 mg, Oral, DAILY, Richardean Chimeraiane S Sinnatamby, MD  ???  [START ON 03/27/2016] vancomycin 50 mg/mL oral solution (compounded) 125 mg, 125 mg, Oral, EVERY OTHER DAY, Richardean Chimeraiane S Sinnatamby, MD  ???  [START ON 04/03/2016] vancomycin 50 mg/mL oral solution (compounded) 125 mg, 125 mg, Oral, Q3DAYS, Richardean Chimeraiane S Sinnatamby, MD  ???  lactobac ac& pc-s.therm-b.anim (FLORA Q/RISAQUAD), 1 Cap, Oral, DAILY, Vena AustriaVarun K Uriah Trueba, MD, 1 Cap at 03/08/16 1320  ???  albuterol (PROVENTIL VENTOLIN) nebulizer solution 2.5 mg, 2.5 mg, Nebulization, Q4H PRN, Tanja PortPhillip D Klahr, MD  ???  epoetin alfa (EPOGEN;PROCRIT) injection 10,000 Units, 10,000 Units, SubCUTAneous, Q MON, WED & FRI, Gary Fleetrudy L Rickman, MD  ???  cinacalcet (SENSIPAR) tablet 30 mg, 30 mg, Oral, DAILY WITH BREAKFAST, Gary Fleetrudy L Rickman, MD, 30 mg at 03/08/16 [id number]  ???  doxercalciferol (HECTOROL) 4 mcg/2 mL injection 2 mcg, 2 mcg, IntraVENous, DIALYSIS MON, WED & FRI, Gary Fleetrudy L Rickman, MD, 2 mcg at 03/08/16 1123   ???  patiromer calcium sorbitex (VELTASSA) powder 8.4 g, 8.4 g, Oral, DAILY, Gary Fleetrudy L Rickman, MD, 8.4 g at 03/05/16 1734  ???  albuterol (PROVENTIL HFA, VENTOLIN HFA, PROAIR HFA) inhaler 2 Puff, 2 Puff, Inhalation, Q6H PRN, Virl DiamondJoanne E Lapetina, MD, 2 Puff at 03/06/16 1822  ???  cloNIDine HCl (CATAPRES) tablet 0.1 mg, 0.1 mg, Oral, Q12H, Gary Fleetrudy L Rickman, MD, 0.1 mg at 03/04/16 1200  ???  sodium chloride (NS) flush 5-10 mL, 5-10 mL, IntraVENous, PRN, Edward JollyFloyd D. Providence LaniusHowell, MD, 10 mL at 03/07/16 1535  ???  amLODIPine (NORVASC) tablet 10 mg, 10 mg, Oral, DAILY, Earvin HansenSamip S Dave, MD, 10 mg at 03/08/16 [id number]  ???  carvedilol (COREG) tablet 12.5 mg, 12.5 mg, Oral, BID WITH MEALS, Earvin HansenSamip S Dave, MD, 12.5 mg at 03/08/16 1320  ???  sevelamer (RENAGEL) tablet 800 mg, 800 mg,  Oral, TID WITH MEALS, Earvin HansenSamip S Dave, MD, 800 mg at 03/08/16 1320  ???  sodium chloride (NS) flush 5-10 mL, 5-10 mL, IntraVENous, Q8H, Earvin HansenSamip S Dave, MD, 10 mL at 03/08/16 1321  ???  ondansetron (ZOFRAN) injection 4 mg, 4 mg, IntraVENous, Q4H PRN, Earvin HansenSamip S Dave, MD, 4 mg at 03/07/16 2120  ???  HYDROmorphone (PF) (DILAUDID) injection 2 mg, 2 mg, IntraVENous, Q3H PRN, Virl DiamondJoanne E Lapetina, MD, 2 mg at 03/08/16 1321    ________________________________________________________________________  Care Plan discussed with:    Comments   Patient y    Family      RN y    Buyer, retailCare Manager     Consultant                        Multidiciplinary team rounds were held today with case manager, nursing, pharmacist and Higher education careers adviserclinical coordinator.  Patient's plan of care was discussed; medications were reviewed and discharge planning was addressed.     ________________________________________________________________________  Total NON critical care TIME:  35   Minutes    Total CRITICAL CARE TIME Spent:   Minutes non procedure based      Comments   >50% of visit spent in counseling and coordination of care y    ________________________________________________________________________  Vena AustriaVarun K Naika Noto, MD      Procedures: see electronic medical records for all procedures/Xrays and details which were not copied into this note but were reviewed prior to creation of Plan.      LABS:  I reviewed today's most current labs and imaging studies.  Pertinent labs include:  Recent Labs      03/08/16   0340  03/07/16   0936   WBC  5.1  4.5   HGB  7.4*  7.4*   HCT  23.7*  23.8*   PLT  132*  131*     Recent Labs      03/07/16   0936   NA  140   K  4.3   CL  103   CO2  28   GLU  102*   BUN  31*   CREA  7.51*   CA  7.1*   MG  1.9   PHOS  5.2*       Signed: Vena AustriaVarun K Maejor Erven, MD

## 2016-03-08 NOTE — Progress Notes (Signed)
Bedside and Verbal shift change report given to Brian, RN  (oncoming nurse) by Stephanie RN (offgoing nurse).  Report given with SBAR, Kardex, Intake/Output, MAR and Recent Results.

## 2016-03-08 NOTE — Progress Notes (Signed)
Rapid assessment called at 0710 for room 3260. Arrived at pt's room 0711. Patient presents with 10/10 Chest pain. Assessment completed. The following interventions were provided: EKG, CE, Cardiology consult and chest xray. Dr. Darlina RumpfGolla and at bedside notified. Orders were obtained. Based on assessment and current results pt. will remain on current unit.

## 2016-03-08 NOTE — Other (Signed)
[facility]  [phone]    Vitals   Pre   Post   Assessment   Pre   Post     Temp  Temp: 98.2 ??F (36.8 ??C) (03/08/16 0700)  98.9 LOC  A&Ox 3 and following commands A&Ox 3 and following commands   HR   89 89 Lungs   Clear to diminished in the bases Clear to diminished in the bases   B/P  173/100 153/74 Cardiac   Heart sounds auscultated, pulses palpable Heart sounds auscultated, pulses palpable   Resp   16 16 Skin   Warm, dry and intact Warm, dry and intact   Pain level  5 6 Edema  Left arm  Left arm   Orders:    Duration:   Start:   0915 End:   1245 Total:   3.5 hours   Dialyzer:   Revaclear   K Bath:   2   Ca Bath:   2.5   Na/Bicarb:   140   Target Fluid Removal:   2.5 kg   Access     Type & Location:   LSC CVC. +NS flushes/aspiration x 2 ports  1055- Per Dr. Manson PasseyBrown (Vascular) LUA AVF accessed with 15g needles. +thrill, bruit, flashes, aspiration, NS flushes.    Labs     Obtained/Reviewed   Critical Results Called   Date when labs were drawn-  Hgb-    HGB   Date Value Ref Range Status   03/08/2016 7.4 (L) 12.1 - 17.0 g/dL Final     K-    Potassium   Date Value Ref Range Status   03/07/2016 4.3 3.5 - 5.1 mmol/L Final     Ca-   Calcium   Date Value Ref Range Status   03/07/2016 7.1 (L) 8.5 - 10.1 MG/DL Final     Bun-   BUN   Date Value Ref Range Status   03/07/2016 31 (H) 6 - 20 MG/DL Final     Creat-   Creatinine   Date Value Ref Range Status   03/07/2016 7.51 (H) 0.70 - 1.30 MG/DL Final        Medications/ Blood Products Given     Name   Dose   Route and Time     Hectorol 2mcg IV @ 1123             Blood Volume Processed (BVP):    47.9 Net Fluid   Removed:  1957 ml   Comments   Time Out Done: Yes: Letta KocherKiana Kelly, RN  Primary Nurse Rpt Pre: Judeth CornfieldStephanie Day, RN  Primary Nurse Rpt Post: Judeth CornfieldStephanie Day, RN  Pt Education: S&S of infection  Care Plan: Continue tx as ordered  Tx Summary: Pt tolerated tx well. At the end blood in circuit returned  with 300ml of NS. Dressing/biopatch remain CDI. Returned back to room via transportation. Topped 30 minutes early per pt request. Dr. Rachel BoB. Patel notified.   Admiting Diagnosis:  Pt's previous clinic-  Consent signed - Informed Consent Verified: Yes (03/05/16 2127)  [facility] Consent - Signed  Hepatitis Status- Immune on 08/26/15 Negative on 10/15/15  Machine #- Machine Number: [id number] (03/05/16 2127)  Telemetry status-  Pre-dialysis wt.- Unable to obtain d/t broken bedscale

## 2016-03-08 NOTE — Progress Notes (Signed)
0330  Pt complaining of chest pain and shortness of breath, unrelieved by medication. Spoke with Dr. [name]. Cardiac labs drawn, nitrostat given, and EKG completed per MD orders. Vitals checked, B/P slightly elevated, O2 98%, HR and respirations WNL. Patient placed on oxygen at 2L for comfort.     0430  Pt chest pain is unrelieved at this time. EKG normal. Spoke with Dr. [name] again. Tylenol ordered for paroxysmal chest pain.    0500  Pt sleeping at this time.    0555  Pt complaining of worsening chest pain, stating "it feels like it did when I had my heart attack." MD paged again. Spoke with Dr. [name] once more. Order received for nitrobid cream to be placed on chest. Nitrobid and PRN dilaudid administered.    0650  Pt states pain has not been relieved at all and is getting worse. Rapid response called at this time.     Bedside shift change report given to Judeth CornfieldStephanie (Cabin crewoncoming nurse) by Dorna LeitzBrian S. Strader (offgoing nurse). Report included the following information SBAR, Kardex, Intake/Output, MAR and Recent Results.    Zone Phone for oncoming shift:   [phone]    Shift Summary: See above notes...Marland Kitchen.Marland Kitchen.Marland Kitchen.    LDAs               Peripheral IV 02/29/16 Right Other(comment) (Active)   Site Assessment Clean, dry, & intact 03/07/2016  8:15 PM   Phlebitis Assessment 0 03/07/2016  8:15 PM   Infiltration Assessment 0 03/07/2016  8:15 PM   Dressing Status Clean, dry, & intact 03/07/2016  8:15 PM   Dressing Type Transparent;Tape 03/07/2016  8:15 PM   Hub Color/Line Status Pink;Flushed 03/07/2016  8:15 PM        Hemodialysis Catheter (Active)   Central Line Being Utilized Yes 03/07/2016  8:15 PM   Criteria for Appropriate Use Dialysis/apheresis 03/07/2016  8:15 PM   Site Assessment Clean, dry, & intact 03/07/2016  8:15 PM   Date of Last Dressing Change 03/04/16 03/07/2016  8:15 PM   Dressing Status Clean, dry, & intact 03/07/2016  8:15 PM   Dressing Type Disk with Chlorhexadine gluconate (CHG);Transparent 03/07/2016  8:15 PM    Proximal Hub Color/Line Status Blue;Capped 03/07/2016  8:15 PM   Distal Hub Color/Line Status Red;Capped 03/07/2016  8:15 PM                    Intake & Output   Date 03/07/16 0700 - 03/08/16 0659 03/08/16 0700 - 03/09/16 0659   Shift 0700-1859 1900-0659 24 Hour Total 0700-1859 1900-0659 24 Hour Total   I  N  T  A  K  E   P.O. [phone]         P.O. [phone]       Shift Total  (mL/kg) 720  (6.6) 720  (6.4) 1440  (12.8)      O  U  T  P  U  T   Urine  (mL/kg/hr)            Urine Occurrence(s)  3 x 3 x       Stool            Stool Occurrence(s) 2 x  2 x       Shift Total  (mL/kg)         NET [phone]      Weight (kg) 109 112.1 112.1 112.1 112.1 112.1      Last Bowel Movement  Last Bowel Movement Date: 03/07/16   Glucose Checks [x]  N/A  []  AC/HS  []  Q6  Concerns:   Nutrition Active Orders   Diet    DIET RENAL Regular       Consults [x] PT  [x] OT  [] Speech  [x] Case Management   Cardiac Monitoring [x] N/A [] Yes Expires:

## 2016-03-08 NOTE — Consults (Signed)
Pt seen and examined     Full consult dictated    Doubt this is anginal chest pain.     Will order Lexiscan nuclear to risk stratify

## 2016-03-08 NOTE — Consults (Signed)
[facility]   [address]   [place], [place] [id number]   CONSULTATION       Name:  David [name], David D   MR#:  [id number]   DOB:  10/09/1970   Account #:  [id number]    Date of Consultation:  03/08/2016   Date of Adm:  02/29/2016       REQUESTING PHYSICIAN: Vena AustriaVarun K. Golla, MD    REASON FOR CONSULTATION: Evaluate chest pain.    CHIEF COMPLAINT: Chest pain.    HISTORY OF PRESENT ILLNESS: The patient is a 45 year old man   with a history of hypertension, end-stage renal disease and an   apparent nonischemic cardiomyopathy. He was admitted back on   02/29/2016 with multiple complaints including nausea, vomiting, and   diarrhea. He has also had problems with C difficile colitis and is being   evaluated for that problem. He has a history of noncompliance and per   the old records drug-seeking behavior. I was called to see the patient   because of the development of chest pain and shortness of breath this   morning. The patient describes the pain as 10/10 and similar to his   prior heart attack. He says he had a heart attack back in 2010.   Records indicate that he had a catheterization and showed no   significant coronary artery disease. He has undergone stenting   apparently to subclavian vessels. He had an echocardiogram in June   that showed an EF of 55% to 60%. He had an echocardiogram in   August that showed an EF of 35% to 40%. He had a nuclear stress   test in 2015, which was reportedly normal. He has had mild troponin   elevations in the past, which have been felt to be due to his renal   disease.      The patient did have some relief of his symptoms with narcotics. He   says he did not get any relief with nitroglycerin. He is currently off   hemodialysis and continues to complain of chest pain. His EKG shows   no obvious ischemic changes.    PAST MEDICAL HISTORY: As noted above, COPD, recurrent C   difficile colitis, spontaneous bacterial peritonitis.      SURGERY: Prior hernia  repair, prior Perm-A-Cath placement, prior AV   fistula which is apparently clotted.    CURRENT MEDICATIONS:   1. Norvasc.   2. Carvedilol.   3. Clonidine.   4. Aspirin.   5. Vancomycin.   6. Veltassa.   7. Renagel.   8. Sensipar.   9. Hectorol.   10. Epogen.   11. Flora-Q.    SOCIAL HISTORY: The patient does have a history of ongoing   tobacco abuse. No alcohol or documented illicit drug abuse.    FAMILY HISTORY: Negative for heart disease per the old chart.    REVIEW OF SYSTEMS: As noted above, otherwise noncontributory.    PHYSICAL EXAMINATION   GENERAL: Reveals a middle-aged [place] male, currently   on hemodialysis, appearing older than his stated age.   VITAL SIGNS: Blood pressure 173/100, pulse 94, respirations 16,   temperature 98.2.   HEENT: Pupils are equal. Oropharynx with moist oral mucosa.   NECK: Supple. No obvious masses.   CHEST: Clear.   SKIN: Warm and dry.   CARDIAC: Regular rate and rhythm. Soft S4, no S3 or rub.   ABDOMEN: Obese, diffuse tenderness. No obvious masses. Bowel  sounds positive.   EXTREMITIES: No cyanosis or clubbing. 1+ edema. 1-2+ pulses in the   feet bilaterally.   NEUROLOGIC: No obvious gross motor deficits.    LABORATORY DATA: Troponin 0.06 x2. Hemoglobin 7.4, platelets   132,000. Potassium 4.3, sodium 142.    EKG: Normal sinus rhythm, PVCs, nonspecific ST-T changes. No   obvious ischemic changes.     CHEST X-RAY: Negative.    IMPRESSION:   1. Chest pain, probably noncardiac in etiology.   2. Hypertension and hypertensive heart disease.   3. History of apparent nonischemic cardiomyopathy, probably due to   hypertension.   4. End-stage renal disease on chronic hemodialysis.   5. Tobacco abuse.   6. Anemia.   7. History of noncompliance and drug-seeking behavior per the old   chart.    RECOMMENDATIONS: At this point, the patient has had ongoing   chest pain and no enzyme or EKG changes of significance. I suspect   this is noncardiac chest pain. I would recommend we risk  stratify the   patient with a pharmacologic nuclear stress test because of his   symptoms and risk factors. This can be done tomorrow. If this shows   no ischemia, then we can probably discharge the patient to home   without additional cardiac workup. If he continues to have unrelenting   chest pain; however, we may need to consider repeat coronary   angiography.     Thank you for this consultation.         Jasmine AweBRIAN K. Serenitee Fuertes, MD      BKH / RLD   D:  03/08/2016   10:40   T:  03/08/2016   11:21   Job #:  [id number]

## 2016-03-09 ENCOUNTER — Inpatient Hospital Stay: Admit: 2016-03-09 | Payer: MEDICARE

## 2016-03-09 LAB — NUCLEAR STRESS TEST
Duke treadmill score: 5456
Max. Diastolic BP: 92 mmHg
Max. Heart rate: 107 {beats}/min
Max. Systolic BP: 163 mmHg
Peak Ex METs: 1 METS

## 2016-03-09 LAB — EKG NUCLEAR STRESS TEST
Duke treadmill score: 5456
Max Diastolic BP: 92 mmHg
Max Heart Rate: 107 {beats}/min
Max Systolic BP: 163 mmHg
Peak EX METS: 1 METS

## 2016-03-09 LAB — NM MYOCARDIAL SPECT REST EXERCISE OR RX: Left Ventricular Ejection Fraction: 60

## 2016-03-09 MED ORDER — CLONIDINE 0.1 MG TAB
0.1 mg | Freq: Two times a day (BID) | ORAL | Status: DC
Start: 2016-03-09 — End: 2016-03-10

## 2016-03-09 MED ORDER — FENTANYL CITRATE (PF) 50 MCG/ML IJ SOLN
50 mcg/mL | INTRAMUSCULAR | Status: AC
Start: 2016-03-09 — End: ?

## 2016-03-09 MED ORDER — TECHNETIUM TC 99M SESTAMIBI - CARDIOLITE
Freq: Once | Status: AC
Start: 2016-03-09 — End: 2016-03-09
  Administered 2016-03-09: 13:00:00 via INTRAVENOUS

## 2016-03-09 MED ORDER — AMINOPHYLLINE 500 MG/20 ML IV
500 mg/20 mL | Freq: Once | INTRAVENOUS | Status: AC
Start: 2016-03-09 — End: 2016-03-09
  Administered 2016-03-09: 15:00:00 via INTRAVENOUS

## 2016-03-09 MED ORDER — TECHNETIUM TC 99M SESTAMIBI - CARDIOLITE
Freq: Once | Status: AC
Start: 2016-03-09 — End: 2016-03-09
  Administered 2016-03-09: 15:00:00 via INTRAVENOUS

## 2016-03-09 MED ORDER — SODIUM CHLORIDE 0.9 % INJECTION
INTRAMUSCULAR | Status: AC
Start: 2016-03-09 — End: ?

## 2016-03-09 MED ORDER — REGADENOSON 0.4 MG/5 ML IV SYRINGE
0.4 mg/5 mL | Freq: Once | INTRAVENOUS | Status: AC
Start: 2016-03-09 — End: 2016-03-09

## 2016-03-09 MED ORDER — MIDAZOLAM 1 MG/ML IJ SOLN
1 mg/mL | INTRAMUSCULAR | Status: AC
Start: 2016-03-09 — End: ?

## 2016-03-09 MED ORDER — EPHEDRINE SULFATE 50 MG/ML IJ SOLN
50 mg/mL | INTRAMUSCULAR | Status: AC
Start: 2016-03-09 — End: ?

## 2016-03-09 MED FILL — HYDROMORPHONE (PF) 1 MG/ML IJ SOLN: 1 mg/mL | INTRAMUSCULAR | Qty: 2

## 2016-03-09 MED FILL — CLONIDINE 0.1 MG TAB: 0.1 mg | ORAL | Qty: 1

## 2016-03-09 MED FILL — SODIUM CHLORIDE 0.9 % INJECTION: INTRAMUSCULAR | Qty: 10

## 2016-03-09 MED FILL — VANCOMYCIN ORAL SOLUTION 50 MG/ML CPD (RX COMPOUNDED): 50 mg/mL | ORAL | Qty: 5

## 2016-03-09 MED FILL — EPHEDRINE SULFATE 50 MG/ML IJ SOLN: 50 mg/mL | INTRAMUSCULAR | Qty: 1

## 2016-03-09 MED FILL — RISAQUAD 8 BILLION CELL CAPSULE: 8 billion cell | ORAL | Qty: 1

## 2016-03-09 MED FILL — PSYLLIUM HUSK (ASPARTAME) 3.4 GRAM ORAL POWDER PACKET: 3.4 gram | ORAL | Qty: 1

## 2016-03-09 MED FILL — ONDANSETRON (PF) 4 MG/2 ML INJECTION: 4 mg/2 mL | INTRAMUSCULAR | Qty: 2

## 2016-03-09 MED FILL — RENAGEL 800 MG TABLET: 800 mg | ORAL | Qty: 1

## 2016-03-09 MED FILL — BD POSIFLUSH NORMAL SALINE 0.9 % INJECTION SYRINGE: INTRAMUSCULAR | Qty: 30

## 2016-03-09 MED FILL — SENSIPAR 30 MG TABLET: 30 mg | ORAL | Qty: 1

## 2016-03-09 MED FILL — VELTASSA 8.4 GRAM ORAL POWDER PACKET: 8.4 gram | ORAL | Qty: 1

## 2016-03-09 MED FILL — BD POSIFLUSH NORMAL SALINE 0.9 % INJECTION SYRINGE: INTRAMUSCULAR | Qty: 10

## 2016-03-09 MED FILL — FENTANYL CITRATE (PF) 50 MCG/ML IJ SOLN: 50 mcg/mL | INTRAMUSCULAR | Qty: 2

## 2016-03-09 MED FILL — MIDAZOLAM 1 MG/ML IJ SOLN: 1 mg/mL | INTRAMUSCULAR | Qty: 2

## 2016-03-09 MED FILL — CHILDREN'S ASPIRIN 81 MG CHEWABLE TABLET: 81 mg | ORAL | Qty: 1

## 2016-03-09 MED FILL — CARVEDILOL 12.5 MG TAB: 12.5 mg | ORAL | Qty: 1

## 2016-03-09 MED FILL — AMLODIPINE 5 MG TAB: 5 mg | ORAL | Qty: 2

## 2016-03-09 NOTE — Progress Notes (Signed)
Bedside shift change report given to Deion Control and instrumentation engineer(oncoming nurse) by Morrie SheldonAshley (offgoing nurse). Report included the following information SBAR, Kardex, Procedure Summary, Intake/Output, MAR and Accordion.    Zone Phone for oncoming shift:   [phone]    Shift Summary: Stress test done today; Venograph not done due to patient eating; Dilaudid every three hours    LDAs               Peripheral IV 02/29/16 Right Other(comment) (Active)   Site Assessment Clean, dry, & intact 03/09/2016  3:58 PM   Phlebitis Assessment 0 03/09/2016  3:58 PM   Infiltration Assessment 0 03/09/2016  3:58 PM   Dressing Status Clean, dry, & intact 03/09/2016  3:58 PM   Dressing Type Tape;Transparent 03/09/2016  3:58 PM   Hub Color/Line Status Pink;Flushed 03/09/2016  3:58 PM        Hemodialysis Catheter (Active)   Central Line Being Utilized Yes 03/09/2016  3:58 PM   Criteria for Appropriate Use Dialysis/apheresis 03/09/2016  3:58 PM   Site Assessment Clean, dry, & intact 03/09/2016  9:12 AM   Date of Last Dressing Change 03/04/16 03/07/2016  8:15 PM   Dressing Status Clean, dry, & intact 03/09/2016  3:58 PM   Dressing Type Transparent;Disk with Chlorhexadine gluconate (CHG) 03/09/2016  3:58 PM   Proximal Hub Color/Line Status Blue;Capped 03/09/2016  3:58 PM   Distal Hub Color/Line Status Red;Capped 03/09/2016  3:58 PM                    Intake & Output   Date 03/08/16 1900 - 03/09/16 0659 03/09/16 0700 - 03/10/16 0659   Shift 1900-0659 24 Hour Total 0700-1859 1900-0659 24 Hour Total   I  N  T  A  K  E   P.O. 720 720         P.O. 720 720       Shift Total  (mL/kg) 720  (6.4) 720  (6.4)      O  U  T  P  U  T   Urine  (mL/kg/hr)           Urine Occurrence(s)   3 x  3 x    Dialysis  1957         NET Fluid Removed (mL)  1957       Shift Total  (mL/kg)  1957  (17.5)      NET 720 -1237      Weight (kg) 111.8 111.8 111.6 111.6 111.6      Last Bowel Movement Last Bowel Movement Date: 03/09/16   Glucose Checks []  N/A  []  AC/HS  []  Q6  Concerns:    Nutrition Active Orders   Diet    DIET RENAL Regular       Consults [] PT  [] OT  [] Speech  [] Case Management   Cardiac Monitoring [] N/A [] Yes Expires:

## 2016-03-09 NOTE — Progress Notes (Signed)
MPI in progress   Please check with DR regarding diet  Scan  today @ 11:30am

## 2016-03-09 NOTE — Other (Addendum)
sbar  Out note.  [facility] Informed that pt ate at 1100 surgery cancelled cannot be put to sleep   vss  98.6po - 101-162/88-15- 100% nc 2 liters oxygen .  Pt update given opportunity for questions given and answered pt transported back to floor by attendant.  Pt wearing his bracelet and  Eyeglasses.

## 2016-03-09 NOTE — Progress Notes (Signed)
Nuclear medicine cardiac study completed.

## 2016-03-09 NOTE — Progress Notes (Signed)
Problem: Falls - Risk of  Goal: *Absence of Falls  Document Schmid Fall Risk and appropriate interventions in the flowsheet.   Outcome: Progressing Towards Goal  Fall Risk Interventions:  Mobility Interventions: Patient to call before getting OOB    Mentation Interventions: Adequate sleep, hydration, pain control    Medication Interventions: Teach patient to arise slowly    Elimination Interventions: Call light in reach    History of Falls Interventions: Door open when patient unattended

## 2016-03-09 NOTE — Progress Notes (Signed)
Infectious Disease Progress Note      IMPRESSION:   1. Recurrent C.Diff colitis, improving  2. FMT could not be done due to poor preparation , normal bowel mucosa noted/ formed, solid  stool in rectum, procedure on upper extremity fistula abandoned today  as pt had eaten.  3. ? Compliance / reliability of history - has been questioned from time of admission    4. Ascites   5. Appreciate GI  input.     PLAN:   1.  Pt on vancomycin pulsed taper  started 11/17 & probiotics ( see regimen  below)  2.  Draw AFB Smear & Culture, CEA, AFP, Cytology  of fluid with next paracentesis     Subjective:      Pt seen.  Reports 3 bowel movements . Nurse states he has not had any bowel movements , he has been to many tests     Review of Systems:  A comprehensive review of systems was negative except for that written in the History of Present Illness.        Objective:     Blood pressure 162/88, pulse (!) 101, temperature 98.2 ??F (36.8 ??C), resp. rate 18, height 6\' 1"  (1.854 m), weight 111.6 kg (246 lb), SpO2 100 %.  Temp (24hrs), Avg:98.2 ??F (36.8 ??C), Min:98.1 ??F (36.7 ??C), Max:98.4 ??F (36.9 ??C)      Lines:  Peripheral IV:         HD access in chest    Physical Exam:   General:  Alert, cooperative,    Eyes:  Sclera anicteric. Pupils equally round and reactive to light.   Mouth/Throat: Mucous membranes normal, oral pharynx clear   Neck: Supple   Lungs:   Clear to auscultation bilaterally, good effort   CV:  Regular rate and rhythm,no murmur, click, rub or gallop   Abdomen:   Soft, non-tender. bowel sounds normal. Mildly -distended   Extremities: LUE swelling+   Skin: Skin color, texture, turgor normal. no acute rash or lesions   Lymph nodes: Cervical and supraclavicular normal   Musculoskeletal: No swelling or deformity   Lines/Devices:  Intact, no erythema, drainage or tenderness   Psych: Alert and oriented, normal mood affect       Data Review:   CBC:   Recent Labs      03/08/16   0340  03/07/16   0936   WBC  5.1  4.5    RBC  2.52*  2.51*   HGB  7.4*  7.4*   HCT  23.7*  23.8*   PLT  132*  131*     CMP:   Recent Labs      03/07/16   0936   GLU  102*   NA  140   K  4.3   CL  103   CO2  28   BUN  31*   CREA  7.51*   CA  7.1*   AGAP  9   BUCR  4*       Studies:      Lab Results   Component Value Date/Time    Culture result: NO GROWTH 4 DAYS 03/01/2016 01:00 PM    Culture result:  02/29/2016 11:28 AM     NO ROUTINE ENTERIC PATHOGENS ISOLATED INCLUDING SALMONELLA, SHIGELLA, YERSINIA, VIBRIO OR SHIGA TOXIN PRODUCING E. COLI    Culture result: NO GROWTH 5 DAYS 02/29/2016 06:44 AM    Culture result:  01/15/2016 07:36 AM     NO ROUTINE  ENTERIC PATHOGENS ISOLATED INCLUDING SALMONELLA, SHIGELLA, YERSINIA, VIBRIO OR SHIGA TOXIN PRODUCING E. COLI    Culture result: NO GROWTH 4 DAYS 11/21/2015 09:32 PM          XR Results (most recent):    Results from Hospital Encounter encounter on 02/29/16   XR CHEST PORT   Narrative EXAM:  XR CHEST PORT.  INDICATION: Chest pain.  COMPARISON: 03/03/2016.    FINDINGS:   A portable AP radiograph of the chest was obtained at 0740 hours.  Lines and tubes: The patient is on nasal oxygen.  Left dialysis catheter is  unchanged in position.  Lungs: The lungs are clear.   Pleura: There is no pneumothorax or pleural effusion.  Mediastinum: The cardiac silhouette is enlarged and the aorta is mildly  atherosclerotic.  Bones and soft tissues: The bones and soft tissues are grossly within normal  limits.  There are metallic stents overlying the right subclavian and  brachiocephalic vessels.         Impression IMPRESSION: No significant change.          Patient Active Problem List   Diagnosis Code   ??? Gastroenteritis K52.9   ??? Peritonitis (HCC) K65.9   ??? Recurrent Clostridium difficile diarrhea A04.71   ??? Ascites R18.8   ??? ESRD (end stage renal disease) on dialysis (HCC) N18.6, Z99.2         ICD-10-CM ICD-9-CM    1. Acute hyperkalemia E87.5 276.7    2. Abdominal pain, generalized R10.84 789.07     3. Diarrhea, unspecified type R19.7 787.91    4. ESRD (end stage renal disease) (HCC) N18.6 585.6    5. Pleural effusion J90 511.9    6. Hypocalcemia E83.51 275.41    7. Elevated CPK R74.8 790.5        I have discussed the diagnosis with the patient and the intended plan as seen in the above orders.     I have discussed medication side effects and warnings with the patient as well.    Reviewed test results at length with patient    Anti-infectives:     Vancomycin Po 250 mg po 4 times daily x 7 days  Vancomycin 250 mg 2 times daily x 7 days  Vancomycin 125 mg daily x 7 days  Vancomycin 125 mg every other day x 7 days  Vancomycin 125 mg q3d x 2 weeks.     Take probiotics daily,    Follow up in clinic in 4 weeks        Richardean Chimeraiane S Quiara Killian, MD Jerrel IvoryFACP

## 2016-03-09 NOTE — Other (Signed)
sbar in note=  Pt to holding area identifies self and procedurefor today.  Consent resigned as there was no date or time  On form.

## 2016-03-09 NOTE — Progress Notes (Signed)
Bedside shift change report given to Morrie SheldonAshley (Cabin crewoncoming nurse) by Dorna LeitzBrian S. Strader (offgoing nurse). Report included the following information SBAR, Kardex, Intake/Output, MAR and Recent Results.    Zone Phone for oncoming shift:   [phone]    Shift Summary: Pt still having uncontrolled pain in L. Arm. Only mild relief from medication.     LDAs               Peripheral IV 02/29/16 Right Other(comment) (Active)   Site Assessment Clean, dry, & intact 03/09/2016  3:14 AM   Phlebitis Assessment 0 03/09/2016  3:14 AM   Infiltration Assessment 0 03/09/2016  3:14 AM   Dressing Status Clean, dry, & intact 03/09/2016  3:14 AM   Dressing Type Transparent;Tape 03/09/2016  3:14 AM   Hub Color/Line Status Pink;Flushed;Capped 03/09/2016  3:14 AM        Hemodialysis Catheter (Active)   Central Line Being Utilized Yes 03/09/2016  3:14 AM   Criteria for Appropriate Use Dialysis/apheresis 03/09/2016  3:14 AM   Site Assessment Clean, dry, & intact 03/09/2016  3:14 AM   Date of Last Dressing Change 03/04/16 03/07/2016  8:15 PM   Dressing Status Clean, dry, & intact 03/09/2016  3:14 AM   Dressing Type Disk with Chlorhexadine gluconate (CHG);Transparent 03/09/2016  3:14 AM   Proximal Hub Color/Line Status Blue;Capped 03/09/2016  3:14 AM   Distal Hub Color/Line Status Red;Capped 03/09/2016  3:14 AM                    Intake & Output   Date 03/08/16 0700 - 03/09/16 0659 03/09/16 0700 - 03/10/16 0659   Shift 0700-1859 1900-0659 24 Hour Total 0700-1859 1900-0659 24 Hour Total   I  N  T  A  K  E   P.O.  720 720         P.O.  720 720       Shift Total  (mL/kg)  720  (6.4) 720  (6.4)      O  U  T  P  U  T   Urine  (mL/kg/hr)            Urine Occurrence(s)    3 x  3 x    Dialysis 1957  1957         NET Fluid Removed (mL) 1957  1957       Shift Total  (mL/kg) 1957  (17.5)  1957  (17.5)      NET -1957 720 -1237      Weight (kg) 112.1 111.8 111.8 111.8 111.8 111.8      Last Bowel Movement Last Bowel Movement Date: 03/08/16   Glucose Checks [x]  N/A   []  AC/HS  []  Q6  Concerns:   Nutrition Active Orders   Diet    DIET NPO Except Meds       Consults [] PT  [] OT  [] Speech  [x] Case Management   Cardiac Monitoring [x] N/A [] Yes Expires:

## 2016-03-09 NOTE — Progress Notes (Signed)
Pt injected for Nuc Med cardiac study. Pt may have water to drink. Imaging to be done around 9am today.

## 2016-03-09 NOTE — Procedures (Signed)
[facility]  *** FINAL REPORT ***    Name: [name], David  MRN: [id number]    Inpatient  DOB: 02 Dec 1970  HIS Order #: [id number]  TRAKnet Visit #: [id number]  Date: 29 Feb 2016    TYPE OF TEST: Peripheral Venous Testing    REASON FOR TEST  Limb swelling    Left Arm:-  Deep venous thrombosis:           No  Superficial venous thrombosis:    No      INTERPRETATION/FINDINGS  PROCEDURE:  Venous duplex examination using B-mode, color flow and  spectral Doppler of the upper extremity veins.    Left arm :  1. Deep vein(s) visualized include the internal jugular, subclavian,  axillary, brachial, radial and ulnar veins.  2. No evidence of deep venous thrombosis detected in the veins  visualized.  3. No evidence of deep vein thrombosis in the contralateral subclavian   vein.  4. Superficial vein(s) visualized include the basilic (forearm) vein.  5. No evidence of superficial thrombosis detected.    ADDITIONAL COMMENTS    Upper Basilic and Cephalic veins are now AVFs.  Fistula does  demonstrate flow.    I have personally reviewed the data relevant to the interpretation of  this  study.    TECHNOLOGIST: Lisa A. Perrino, RVT, RDMS  Signed: 03/01/2016 09:07 AM    PHYSICIAN: Cherie Lasalle, MD  Signed: 03/09/2016 09:02 AM

## 2016-03-09 NOTE — Progress Notes (Addendum)
I spoke with Donisha with [facility] who states she needs a hep panal, antigens, core, and antibody lab work to assess if they will accept the pt.  Results ecind to her    I called [facility] in [place] at [phone](voice mail left for them to call me to confirm they can accept when stable for d/c).      1530  2nd IM letter printed for pt to sign.  Placed unsigned on clipboard as pt is in OR.

## 2016-03-09 NOTE — Progress Notes (Signed)
GI Note:    GI consulted for recurrent/refractory C diff for possible FMT. Pt underwent attempted FMT via colonoscopy but was found to have solid stool in rectum (with normal mucosa/no pseudomembranes) and thus FMT could not/was not performed. No plans for repeat FMT at this time given those findings.     Recs:  - Agree with Pulse/taper PO Vancomycin per ID  - Regarding ascites this is a low SAAG ascites (SAAG <1.1.) and thus this is NOT c/w portal hypertension/cirrhosis etiology. Pt also w/ no evidence of liver disease (he also reports normal liver biopsy in the past). ID recommendations noted when patient does have a repeat paracentesis of what studies to order. Will defer management of ascites to hospitalist/Nephrologist given it is not related to portal hypertension    GI will sign off. Feel free to call with any questions.    Signed By: Lamar SprinklesJonathan G Sojourner Behringer, MD     March 09, 2016

## 2016-03-09 NOTE — Other (Signed)
Pt arrived in pre op

## 2016-03-09 NOTE — Anesthesia Pre-Procedure Evaluation (Deleted)
Anesthetic History   No history of anesthetic complications            Review of Systems / Medical History  Patient summary reviewed, nursing notes reviewed and pertinent labs reviewed    Pulmonary          Smoker  Asthma        Neuro/Psych   Within defined limits           Cardiovascular    Hypertension              Exercise tolerance: >4 METS  Comments: LVEF 60%.   GI/Hepatic/Renal         Renal disease: ESRD and dialysis       Endo/Other        Anemia     Other Findings                   Anesthesia Plan

## 2016-03-09 NOTE — Progress Notes (Signed)
PCP f/u has been rescheduled with Dr Ree Kidaoopareliya for Nov 30 at 2;00pm

## 2016-03-09 NOTE — Progress Notes (Signed)
Hospitalist Progress Note  NAME: David [name]   DOB:  02-04-1971   MRN:  [id number]       Assessment / Plan:  Chest pain POA recurrent   12 lead ekg shows no stt changes, cardiac enzymes negative. Vitals are normal.  CPK elevated, looks like rhabdomyolysis  CXR is wnl.  ? could be referred pain from left arm.    Recurrent C difficile POA   10 th episode per pt ????!!!!, very unusual  Recently seen in [place] for same  C difficile positive in stool, but does not mean infection is active         PCR can given false +  On enteric precautions  Admit T scan without colitis  Stool transplant planned, but  was aborted  due to incomplete colon prep and there were no pseudomembranes  pt also had FMT attempted at [facility] but was aborted due to not being compliant with colon prep.  Reports diarrhea and N/V not confirmed by NS  High dose narcotics IV,agree with palliative care these need to be weaned  Start discharge planning in AM    ESRD on HD MWF for 7 years, missed 11/10 session  Hyperkalemia resolved  LUE swelling with central stenosis  HD per renal  Denied by multiple dialysis centers due to non compliance and now his only option at this time is go to [place]  er for dialysis until he has a place for dialysis per renal. He is planning to go live with his sister in [place].  AVF LUE is not working as per the pt, which is why he has a L chest catheter  LUE [place] pending shows patent veins and being evaluated by Dr Manson Passey    Recurrent Ascites unclear cause needs out pt follow up  CT AP noted, no evidence of cirrhosis - pt denies EtOH use in the past 10 years   States that he has been worked up for this in the past and it is not because of cirrhosis  SAAG is less than  1.1 so protal HTN is unlikely and Lft's are normal   Echo shows normal EF and no diastolic dysfunction. He does not make any urine to look for urine protein  Will need out pt f/u with his primary gastroenterologist where he claims  he had extensive work up including liver biopsy. Multiple attempts at getting records were not successful.    Peritoneal fluid cell count 238  likey resolving peritonitis and no need for rx per ID and renal.    Hypertension, controlled  BP borderline   on clonidine,coreg and norvasc.    Hx of Hepatitis B  needs out pt f/u on discharge    Obesity POA Body mass index is 32.46 kg/(m^2).    Code status: Full  Prophylaxis: SCD as pt is ambulatory.  Recommended Disposition: Home w/Family after pta of central stenosis.     Subjective:     Chief Complaint / Reason for Physician Visit  Complaints of LQ abdominal pain radiating to his right flank  4 to 5 loose to watery BMs overnight per patient, no reported or witnessed by NS  Several episodes of Nausea/vomiting per pt, not witnessed by NS  Left arm markedly swollen, was supposed to go to OR, but eat before hand    Review of Systems:  Symptom Y/N Comments  Symptom Y/N Comments   Fever/Chills n   Chest Pain y    Poor Appetite n   Edema  Cough    Abdominal Pain n    Sputum    Joint Pain y Left arm swelling  And pain    SOB/DOE n   Pruritis/Rash     Nausea/vomit n   Tolerating PT/OT     Diarrhea n   Tolerating Diet y    Constipation n   Other       Could NOT obtain due to:      Objective:     VITALS:   Last 24hrs VS reviewed since prior progress note. Most recent are:  Patient Vitals for the past 24 hrs:   Temp Pulse Resp BP SpO2   03/09/16 2143 - 92 - 190/90 -   03/09/16 1715 98.3 ??F (36.8 ??C) 100 18 (!) 186/103 96 %   03/09/16 1430 98.2 ??F (36.8 ??C) (!) 101 18 162/88 100 %   03/09/16 0806 98.1 ??F (36.7 ??C) (!) 102 18 (!) 177/98 97 %   03/08/16 2223 98.4 ??F (36.9 ??C) 96 18 (!) 161/92 99 %       Intake/Output Summary (Last 24 hours) at 03/09/16 2206  Last data filed at 03/08/16 2320   Gross per 24 hour   Intake              720 ml   Output                0 ml   Net              720 ml        PHYSICAL EXAM:  General: cooperative, no acute distress????   EENT:  EOMI. Anicteric sclerae. MMM  Resp:  No accessory muscle use or retractions, CTA bilaterally  CV:  Regular  rhythm,?? No edema  GI:  Soft, non tender, distended, BS+  Neurologic:?? Alert and oriented X 3, normal speech, no focal deficits  Psych:???? Fair insight.??Not anxious nor agitated  Skin:  No rashes.  No jaundice.  MSK:  LUE swelling, radial pulse +     Reviewed most current lab test results and cultures  YES  Reviewed most current radiology test results   YES  Review and summation of old records today    NO  Reviewed patient's current orders and MAR    YES  PMH/SH reviewed - no change compared to H&P        ________________________________________________________________________  Care Plan discussed with:    Comments   Patient y    Family      RN y    Buyer, retailCare Manager     Consultant  y  Arline AspCindy from palliative care                     Multidiciplinary team rounds were held today with case manager, nursing, pharmacist and Higher education careers adviserclinical coordinator.  Patient's plan of care was discussed; medications were reviewed and discharge planning was addressed.     ________________________________________________________________________  Total NON critical care TIME:  35   Minutes    Total CRITICAL CARE TIME Spent:   Minutes non procedure based      Comments   >50% of visit spent in counseling and coordination of care y    ________________________________________________________________________  Tawny AsalJr Ardine Iacovelli P Ciesla, MD     Procedures: see electronic medical records for all procedures/Xrays and details which were not copied into this note but were reviewed prior to creation of Plan.      LABS:  I reviewed today's most current labs and  imaging studies.  Pertinent labs include:  Recent Labs      03/08/16   0340  03/07/16   0936   WBC  5.1  4.5   HGB  7.4*  7.4*   HCT  23.7*  23.8*   PLT  132*  131*     Recent Labs      03/07/16   0936   NA  140   K  4.3   CL  103   CO2  28   GLU  102*   BUN  31*   CREA  7.51*   CA  7.1*   MG  1.9    PHOS  5.2*       Signed: Tawny AsalJr Mariah Gerstenberger P Ciesla, MD

## 2016-03-09 NOTE — Progress Notes (Signed)
Lexiscan stress test completed. Second set of cardiolite images to follow later today. May resume previous diet if okay with MD

## 2016-03-09 NOTE — Consults (Addendum)
Palliative Medicine Consult  [place]: [phone])  Patient Name: David [name]  Date of Birth: 1971-01-06    Date of Initial Consult: 03/09/16  Reason for Consult: Psychosocial Distress  Requesting Provider: Lyn Records   Primary Care Physician: None     SUMMARY:   David [name] is a 45 y.o. with a past history of  CKD, C. Diff, HTN, asthma, and ascites, who was admitted on 02/29/2016 from home with a diagnosis of acute hyperkalemia, abdominal pain, diarrhea. Current medical issues leading to Palliative Medicine involvement include: refused dialysis on admission due to abdominal pain. Then following day, 11/13, signed off after 2.5 hours, paracentesis with 3623m removed.  11/15 pt refused IV Vancomycin, stating it caused SOB. Fecal transplant and colonoscopy planned for 11/16.  11/16am, noncompliant with medications. Found coming from elevators fully dressed and with coat on; states he went for a walk. Signed off dialysis early after 3 hr 15 min.  Colonoscopy aborted early as rectal exam revealed solid, thick brown stool in entire lumen without any pseudomembranes or signs of c.diff. Colonic mucosa was completely healthy and normal. Scope could not be advanced through solid stool, so procedure and FMT aborted.  GI discovered more than half GoLytely was not ingested.  [facility]   11/17: RN attempted to start dialysis, pt declined until after he ate breakfast.  He was scheduled 11/17 for discharge, however, he "appealed his discharge" and refused to leave. He completed HD 11/17.  11/20: c/o chest pain, SOB "feels like when I had my heart attack."  No cardiac enzyme or EKG changes, cardiology doubtful pain is cardiac related.  11/20: completed HD.  11/21: lexiscan completed, then scheduled for vascular procedure for fistula, however, pt ate prior to procedure, therefore, unable to have  anesthesia.  Refused to have procedure under local anesthesia.   ??       PALLIATIVE DIAGNOSES:   1. Acute hyperkalemia  2. Abdominal pain  3. Diarrhea: treated for c-diff 10 times since Jan 2017   4. Chest pain  5. Fever  6. SOB  7. BLE edema  8. Nausea and vomiting  9. LUE edema and pain  10. ESRD on HD MWF for 7 years  170 Recurrent ascites: no evidence of cirrhosis, no EtOH for past 10 years.  [facility], last done 11/8 for 7.5L.   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 Medical non-compliance  14. Code status, AMD, goals of care       PLAN:   1. Met with pt, he was willing to complete an AMD.  Appeared remorseful and expressed disappointment in himself for "messing up" the colon prep ("although I don't know why I couldn't have had more time to do it?"), and felt bad "that the RN got yelled at because I didn't know I couldn't eat before the procedure today."  States he is waiting for acceptance to a new dialysis center; states he does not get along with the doctors at the center he goes to.   2. Will see again tomorrow.  3. Recommend stopping IV Dilaudid; he has been getting it every 3 hours for 9 days; he will, if not already, develop a tolerance and require a taper off. Recommend changing to oral route.  Spoke to Dr. CLevy Sjogren   4. Initial consult note routed to primary  continuity provider  5. Communicated plan of care with: Palliative IDT, RN     GOALS OF CARE / TREATMENT PREFERENCES:   [====Goals of Care====]  GOALS OF CARE:  Patient / health care proxy stated goals:     1. I want my arm fixed.   2. Transfer to new outpatient dialysis unit.      TREATMENT PREFERENCES:   Code Status: Full Code    Advance Care Planning:  Advance Care Planning 03/09/2016   Patient's Healthcare Decision Maker is: Named in scanned ACP document   Primary Decision Maker Name Keymon Mcelroy   Primary Decision Maker Phone Number [phone]    Primary Decision Maker Relationship to Patient Parent   Secondary Decision Maker Name [name]   Secondary Decision Maker Phone Number [phone]   Secondary Decision Maker Relationship to Patient Sibling   Confirm Advance Directive Yes, on file       Other:    As far as possible, the palliative care team has discussed with patient / health care proxy about goals of care / treatment preferences for patient.  [====Goals of Care====]         HISTORY:     History obtained from: chart, patient    CHIEF COMPLAINT: abdominal pain    HPI/SUBJECTIVE:    The patient is:   '[x]'$  Verbal and participatory  '[]'$  Non-participatory due to:     Presented ambulatory to the ED with c/o diffuse abdominal pain x 3 days, nausea, vomiting 4-5x, diarrhea 5-6 times, fever of 101.5, and SOB.  He gets HD on MWF, however, he missed Friday because he was too ill. His renal failure is 2/2 HTN and he is anuric.  He has a fistula on his LUA which has been swollen x 2 days, and has a Quinton in his left chest, last used 11/8, but clotted during his dialysis session. He was treated last month for c-diff with Vancomycin and Flagyl.        Today, still having diarrhea, 5-6x today, LUE painful to touch.    Clinical Pain Assessment (nonverbal scale for severity on nonverbal patients):   [++++ Clinical Pain Assessment++++]  [++++Pain Severity++++]: Pain: 6  [++++Pain Character++++]: sharp, achy  [++++Pain Duration++++]:  week  [++++Pain Effect++++]: can't use arm or hand due to swelling and pain  [++++Pain Factors++++]: movement, touch  [++++Pain Frequency++++]: constant  [++++Pain Location++++]:    LUE  [++++ Clinical Pain Assessment++++]  Duration: for how long has pt been experiencing pain (e.g., 2 days, 1 month, years)  Frequency: how often pain is an issue (e.g., several times per day, once every few days, constant)     FUNCTIONAL ASSESSMENT:     Palliative Performance Scale (PPS):  PPS: 60       PSYCHOSOCIAL/SPIRITUAL SCREENING:      Palliative IDT has assessed this patient for cultural preferences / practices and a referral made as appropriate to needs (Cultural Services, Patient Advocacy, Ethics, etc.)    Advance Care Planning:  Advance Care Planning 03/09/2016   Patient's Healthcare Decision Maker is: Named in scanned ACP document   Primary Decision Maker Name David [name]   Primary Decision Maker Phone Number [phone]   Primary Decision Maker Relationship to Patient Parent   Secondary Decision Maker Name [name]   Secondary Decision Maker Phone Number [phone]   Secondary Decision Maker Relationship to Patient Sibling   Confirm Advance Directive Yes, on file       Any spiritual / religious concerns:  '[]'$   Yes /  '[x]'$  No    Caregiver Burnout:  '[]'$  Yes /  '[]'$  No /  '[x]'$  No Caregiver Present      Anticipatory grief assessment:   '[x]'$  Normal  / '[]'$  Maladaptive       ESAS Anxiety: Anxiety: 0    ESAS Depression: Depression: 0        REVIEW OF SYSTEMS:     Positive and pertinent negative findings in ROS are noted above in HPI.  The following systems were '[x]'$  reviewed / '[]'$  unable to be reviewed as noted in HPI  Other findings are noted below.  Systems: constitutional, ears/nose/mouth/throat, respiratory, gastrointestinal, genitourinary, musculoskeletal, integumentary, neurologic, psychiatric, endocrine. Positive findings noted below.  Modified ESAS Completed by: provider   Fatigue: 0 Drowsiness: 0   Depression: 0 Pain: 6   Anxiety: 0 Nausea: 0   Anorexia: 0 Dyspnea: 0     Constipation: No     Stool Occurrence(s): 2 (pt reported 2 diarrhea occurences)        PHYSICAL EXAM:     From RN flowsheet:  Wt Readings from Last 3 Encounters:   03/09/16 246 lb (111.6 kg)   02/12/16 218 lb 7.6 oz (99.1 kg)   01/15/16 235 lb (106.6 kg)     Blood pressure (!) 186/103, pulse 100, temperature 98.3 ??F (36.8 ??C), resp. rate 18, height '6\' 1"'$  (1.854 m), weight 246 lb (111.6 kg), SpO2 96 %.    Pain Scale 1: Numeric (0 - 10)  Pain Intensity 1: 6   Pain Onset 1: left arm pain   Pain Location 1: Abdomen, Arm  Pain Orientation 1: Left  Pain Description 1: Sharp  Pain Intervention(s) 1: Medication (see MAR)  Last bowel movement, if known:     Constitutional: WD, WN, NAD  Eyes: pupils equal, anicteric  ENMT: no nasal discharge, moist mucous membranes  Cardiovascular: regular rhythm, distal pulses intact, Quinton left chest  Respiratory: breathing not labored, symmetric  Gastrointestinal: soft non-tender, +bowel sounds  Musculoskeletal: no deformity, no tenderness to palpation, LUE very swollen, painful to touch  Skin: warm, dry  Neurologic: following commands, moving all extremities  Psychiatric: full affect, no hallucinations          HISTORY:     Active Problems:    Recurrent Clostridium difficile diarrhea (02/29/2016)      Ascites (03/04/2016)      ESRD (end stage renal disease) on dialysis ([place]) (03/04/2016)      Past Medical History:   Diagnosis Date   ??? Adverse effect of anesthesia     sleep apnea uses oxygen at night    ??? Ascites    ??? Asthma    ??? C. difficile colitis    ??? CKD (chronic kidney disease) stage V requiring chronic dialysis ([place])    ??? HTN (hypertension)    ??? SBP (spontaneous bacterial peritonitis) [facility])       Past Surgical History:   Procedure Laterality Date   ??? COLONOSCOPY N/A 03/04/2016    COLONOSCOPY performed by Macie Burows, MD at [facility]   ??? HX HERNIA REPAIR     ??? SIGMOIDOSCOPY,DIAGNOSTIC  03/04/2016           History reviewed. No pertinent family history.   History reviewed, no pertinent family history.  Social History   Substance Use Topics   ??? Smoking status: Heavy Tobacco Smoker     Packs/day: 0.50     Years: 10.00   ??? Smokeless tobacco: Never Used   ???  Alcohol use No     Allergies   Allergen Reactions   ??? Lisinopril Shortness of Breath   ??? Morphine Shortness of Breath   ??? Toradol [Ketorolac] Shortness of Breath      Current Facility-Administered Medications   Medication Dose Route Frequency    ??? cloNIDine HCl (CATAPRES) tablet 0.2 mg  0.2 mg Oral Q12H   ??? nitroglycerin (NITROSTAT) tablet 0.4 mg  0.4 mg SubLINGual PRN   ??? acetaminophen (TYLENOL) tablet 650 mg  650 mg Oral Q6H PRN   ??? aspirin chewable tablet 81 mg  81 mg Oral DAILY   ??? psyllium husk-aspartame (METAMUCIL FIBER) packet 1 Packet  1 Packet Oral BID   ??? vancomycin 50 mg/mL oral solution (compounded) 250 mg  250 mg Oral QID   ??? [START ON 03/13/2016] vancomycin 50 mg/mL oral solution (compounded) 250 mg  250 mg Oral BID   ??? [START ON 03/20/2016] vancomycin 50 mg/mL oral solution (compounded) 125 mg  125 mg Oral DAILY   ??? [START ON 03/27/2016] vancomycin 50 mg/mL oral solution (compounded) 125 mg  125 mg Oral EVERY OTHER DAY   ??? [START ON 04/03/2016] vancomycin 50 mg/mL oral solution (compounded) 125 mg  125 mg Oral Q3DAYS   ??? lactobac ac& pc-s.therm-b.anim (FLORA Q/RISAQUAD)  1 Cap Oral DAILY   ??? albuterol (PROVENTIL VENTOLIN) nebulizer solution 2.5 mg  2.5 mg Nebulization Q4H PRN   ??? epoetin alfa (EPOGEN;PROCRIT) injection 10,000 Units  10,000 Units SubCUTAneous Q MON, WED & FRI   ??? cinacalcet (SENSIPAR) tablet 30 mg  30 mg Oral DAILY WITH BREAKFAST   ??? doxercalciferol (HECTOROL) 4 mcg/2 mL injection 2 mcg  2 mcg IntraVENous DIALYSIS MON, WED & FRI   ??? patiromer calcium sorbitex (VELTASSA) powder 8.4 g  8.4 g Oral DAILY   ??? albuterol (PROVENTIL HFA, VENTOLIN HFA, PROAIR HFA) inhaler 2 Puff  2 Puff Inhalation Q6H PRN   ??? sodium chloride (NS) flush 5-10 mL  5-10 mL IntraVENous PRN   ??? amLODIPine (NORVASC) tablet 10 mg  10 mg Oral DAILY   ??? carvedilol (COREG) tablet 12.5 mg  12.5 mg Oral BID WITH MEALS   ??? sevelamer (RENAGEL) tablet 800 mg  800 mg Oral TID WITH MEALS   ??? sodium chloride (NS) flush 5-10 mL  5-10 mL IntraVENous Q8H   ??? ondansetron (ZOFRAN) injection 4 mg  4 mg IntraVENous Q4H PRN   ??? HYDROmorphone (PF) (DILAUDID) injection 2 mg  2 mg IntraVENous Q3H PRN          LAB AND IMAGING FINDINGS:     Lab Results   Component Value Date/Time     WBC 5.1 03/08/2016 03:40 AM    HGB 7.4 03/08/2016 03:40 AM    PLATELET 132 03/08/2016 03:40 AM     Lab Results   Component Value Date/Time    Sodium 140 03/07/2016 09:36 AM    Potassium 4.3 03/07/2016 09:36 AM    Chloride 103 03/07/2016 09:36 AM    CO2 28 03/07/2016 09:36 AM    BUN 31 03/07/2016 09:36 AM    Creatinine 7.51 03/07/2016 09:36 AM    Calcium 7.1 03/07/2016 09:36 AM    Magnesium 1.9 03/07/2016 09:36 AM    Phosphorus 5.2 03/07/2016 09:36 AM      Lab Results   Component Value Date/Time    AST (SGOT) 19 02/29/2016 06:21 AM    Alk. phosphatase 221 02/29/2016 06:21 AM    Protein, total 6.1 02/29/2016 06:21 AM  Albumin 2.4 02/29/2016 06:21 AM    Globulin 3.7 02/29/2016 06:21 AM     Lab Results   Component Value Date/Time    INR 1.2 12/19/2015 03:58 AM    Prothrombin time 11.9 12/19/2015 03:58 AM    aPTT 32.9 12/19/2015 03:58 AM      Lab Results   Component Value Date/Time    Iron 83 10/15/2015 04:03 AM    TIBC 285 10/15/2015 04:03 AM    Iron % saturation 29 10/15/2015 04:03 AM    Ferritin 1247 10/15/2015 04:03 AM      No results found for: PH, PCO2, PO2  No components found for: [facility]   Lab Results   Component Value Date/Time    CK 1279 03/08/2016 07:14 AM    CK - MB 37.9 03/08/2016 07:14 AM                Total time:   Counseling / coordination time, spent as noted above:   > 50% counseling / coordination?: yes    Prolonged service was provided for  _0 30 min   _1 75 min in face to face time in the presence of the patient, spent as noted above.  Time Start:   Time End:   Note: this can only be billed with [phone] (initial) or [phone] (follow up).  If multiple start / stop times, list each separately.

## 2016-03-09 NOTE — Progress Notes (Addendum)
Cardiology Progress Note      03/09/2016 8:53 AM    Admit Date: 02/29/2016          Subjective: Sitting up on side of bed. Still having constant chest pain. For nuclear stress this am.          Visit Vitals   ??? BP (!) 177/98 (BP 1 Location: Right arm)   ??? Pulse (!) 102   ??? Temp 98.1 ??F (36.7 ??C)   ??? Resp 18   ??? Ht 6\' 1"  (1.854 m)   ??? Wt 111.8 kg (246 lb 7.6 oz)   ??? SpO2 97%   ??? BMI 32.52 kg/m2     11/19 1901 - 11/21 0700  In: 1440 [P.O.:1440]  Out: 1957         Objective:      Physical Exam:  VS as above  Chest CTA  Card RRR no pericardial rub     Data Review:   Labs:  No results found for this or any previous visit (from the past 24 hour(s)).    Telemetry: off tele at present       Assessment:     1. Chest pain, probably noncardiac in etiology.   2. Hypertension and hypertensive heart disease.   3. History of apparent nonischemic cardiomyopathy, probably due to   hypertension.   4. End-stage renal disease on chronic hemodialysis.   5. Tobacco abuse.   6. Anemia.   7. History of noncompliance and drug-seeking behavior per the old   chart.    Plan:  Lexiscan nuclear stress today. If neg for ischemia would not pursue further Increase clonidine for BP

## 2016-03-09 NOTE — Progress Notes (Signed)
Mr David [name] had an NPO and consent order entered into chart at 15:45 yesterday for OR today.  He reports he 'was told he could eat after his stress test today', and he did, thereby rendering him ineligible for an anesthetic. I have offered to perform procedure under local anesthesia, but pt feels he cannot tolerate needle placement in fistula without IV narcotics (something that certainly won't happen in an outpt dialysis center). This makes me wonder whether catheter removal is a good idea at all or whether ligation, leaving him cath dependent, is best option for arm swelling. Challenging problem made more so by patient insight/compliance.     I arranged case today as all parties wanted his arm swelling addressed before discharge: however, there is no reason [facility] and with his original Management consultantaccess surgeon. I do not have OR time to perform tomorrow. Perhaps the interventional radiologist would like to get involved(?)

## 2016-03-09 NOTE — Progress Notes (Signed)
Progress Note    NAME: David [name]   DOB:  04-Jul-1970   MRN:  [id number]     Date/Time:  03/09/2016    Assessment :    Plan:  ESRD- from [place]; MWF  Hyperkalemia-expect to have resolved post HD yesterday  Mild pulmonary edema on cxr  Uncontrolled htn  Perm cath  Failed AV accesses  Anemia of CKD  Swollen arm/hand/central stenosis     Cut his HD Rx short by 45 mins.   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 For stress test today    OBJECTIVE:  Visit Vitals   ??? BP (!) 177/98 (BP 1 Location: Right arm)   ??? Pulse (!) 102   ??? Temp 98.1 ??F (36.7 ??C)   ??? Resp 18   ??? Ht '6\' 1"'  (1.854 m)   ??? Wt 111.8 kg (246 lb 7.6 oz)   ??? SpO2 97%   ??? BMI 32.52 kg/m2       Gen-NAD  Tr edema  L AV access patent; surrounding edema  L TDC intact  AOx3    LAB DATA REVIEWED:    Recent Labs      03/08/16   0340  03/07/16   0936   WBC  5.1  4.5   HGB  7.4*  7.4*   HCT  23.7*  23.8*   PLT  132*  131*     Recent Labs      03/07/16   0936   NA  140   K  4.3   CL  103   CO2  28   BUN  31*   CREA  7.51*   GLU  102*   CA  7.1*   MG  1.9   PHOS  5.2*     No results for input(s): SGOT, GPT, ALT, AP, TBIL, TBILI, ALB, GLOB, GGT, AML, LPSE in the last 72 hours.    No lab exists for component: AMYP, HLPSE  No results for input(s): INR, PTP, APTT in the last 72 hours.    No lab exists for component: INREXT, INREXT   No results for input(s): FE, TIBC, PSAT, FERR in the last 72 hours.    No results for input(s): PH, PCO2, PO2 in the last 72 hours.  Recent Labs      03/08/16   0714  03/08/16   0340   CPK  1279*  1225*   CKMB  37.9*  34.2*     Lab Results   Component Value Date/Time    Glucose (POC) 96 03/04/2016 02:04 PM    Glucose (POC) 105 02/29/2016 11:05 AM       Procedures: see electronic medical records for all procedures/Xrays and details which were not copied into this note but were reviewed prior to creation of Plan.    ________________________________________________________________________  ___________________________________________________  Consulting Physician: Lindaann Pascal, MD

## 2016-03-09 NOTE — Other (Signed)
Right and left dp and pt pulses palp. Left arm swollen about 3 plus.  Non pitting.   Left thrill and bruit present.  Right and left dp and pt pulse palp.   Feet swollen about 2 lus  surgery cancelled pt transferred back up to floor .  Pt spoke with dr. Manson PasseyBrown and surgery is  Cancelled as pt ate at 1100  And can not be put to sleep for surgery at this time

## 2016-03-09 NOTE — Other (Signed)
TRANSFER - IN REPORT:    Verbal report received from CluteAshleigh, RN (name) on Laqueta Lindenroy D Burgener  being received from Renal (unit) for ordered procedure      Report consisted of patient???s Situation, Background, Assessment and   Recommendations(SBAR).     Information from the following report(s) SBAR, [facility]ex, [facility] and Recent Results was reviewed with the receiving nurse.    Opportunity for questions and clarification was provided.      Assessment completed upon patient???s arrival to unit and care assumed.

## 2016-03-09 NOTE — Consults (Signed)
Palliative Medicine Consult  [place]: [phone])    Patient Name: David [name]  Date of Birth: [birth year]    Date of Initial Consult: 03/09/16  Reason for Consult: Psychosocial Distress  Requesting Provider: Lyn Records   Primary Care Physician: None     SUMMARY:   David [name] is a 45 y.o. with a past history of  CKD, C. Diff, HTN, asthma, and ascites, who was admitted on 02/29/2016 from home with a diagnosis of acute hyperkalemia, abdominal pain, diarrhea. Current medical issues leading to Palliative Medicine involvement include: refused dialysis on admission due to abdominal pain. Then following day, 11/13, signed off after 2.5 hours, paracentesis with 3628m removed.  11/15 pt refused IV Vancomycin, stating it caused SOB. Fecal transplant and colonoscopy planned for 11/16.  11/16am, noncompliant with medications. Found coming from elevators fully dressed and with coat on; states he went for a walk. Signed off dialysis early after 3 hr 15 min.  Colonoscopy aborted early as rectal exam revealed solid, thick brown stool in entire lumen without any pseudomembranes or signs of c.diff. Colonic mucosa was completely healthy and normal. Scope could not be advanced through solid stool, so procedure and FMT aborted.  GI discovered more than half GoLytely was not ingested.  [facility]   11/17: RN attempted to start dialysis, pt declined until after he ate breakfast.  He was scheduled 11/17 for discharge, however, he "appealed his discharge" and refused to leave. He completed HD 11/17.  11/20: c/o chest pain, SOB "feels like when I had my heart attack."  No cardiac enzyme or EKG changes, cardiology doubtful pain is cardiac related.  11/20: completed HD.  11/21: lexiscan completed, then scheduled for vascular procedure for fistula, however, pt ate prior to procedure, therefore, unable to have anesthesia.  Refused to have  procedure under local anesthesia.   ??       PALLIATIVE DIAGNOSES:   1. Acute hyperkalemia  2. Abdominal pain  3. Diarrhea: treated for c-diff 10 times since Jan 2017   4. Chest pain  5. Fever  6. SOB  7. BLE edema  8. Nausea and vomiting  9. LUE edema and pain  10. ESRD on HD MWF for 7 years  140 Recurrent ascites: no evidence of cirrhosis, no EtOH for past 10 years.  [facility], last done 11/8 for 7.5L.   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 Medical non-compliance  14. Code status, AMD, goals of care       PLAN:   1. Met with pt, he was willing to complete an AMD.  Appeared remorseful and expressed disappointment in himself for "messing up" the colon prep ("although I don't know why I couldn't have had more time to do it?"), and felt bad "that the RN got yelled at because I didn't know I couldn't eat before the procedure today."  States he is waiting for acceptance to a new dialysis center; states he does not get along with the doctors at the center he goes to.   2. Will see again tomorrow.  3. Recommend stopping IV Dilaudid; he has been getting it every 3 hours for 9 days; he will, if not already, develop a tolerance and require a taper off. Recommend changing to oral route.  Spoke to Dr. CLevy Sjogren   4. Initial consult note routed  to primary continuity provider  5. Communicated plan of care with: Palliative IDT, RN     GOALS OF CARE / TREATMENT PREFERENCES:   [====Goals of Care====]  GOALS OF CARE:  Patient / health care proxy stated goals:     1. I want my arm fixed.   2. Transfer to new outpatient dialysis unit.      TREATMENT PREFERENCES:   Code Status: Full Code    Advance Care Planning:  Advance Care Planning 03/09/2016   Patient's Healthcare Decision Maker is: Named in scanned ACP document   Primary Decision Maker Name David [name]   Primary Decision Maker Phone Number [phone]   Primary Decision Maker Relationship to Patient Parent    Secondary Decision Maker Name David [name]   Secondary Decision Maker Phone Number [phone]   Secondary Decision Maker Relationship to Patient Sibling   Confirm Advance Directive Yes, on file       Other:    As far as possible, the palliative care team has discussed with patient / health care proxy about goals of care / treatment preferences for patient.  [====Goals of Care====]         HISTORY:     History obtained from: chart, patient    CHIEF COMPLAINT: abdominal pain    HPI/SUBJECTIVE:    The patient is:   _0  Verbal and participatory  _1  Non-participatory due to:     Presented ambulatory to the ED with c/o diffuse abdominal pain x 3 days, nausea, vomiting 4-5x, diarrhea 5-6 times, fever of 101.5, and SOB.  He gets HD on MWF, however, he missed Friday because he was too ill. His renal failure is 2/2 HTN and he is anuric.  He has a fistula on his LUA which has been swollen x 2 days, and has a Quinton in his left chest, last used 11/8, but clotted during his dialysis session. He was treated last month for c-diff with Vancomycin and Flagyl.        Today, still having diarrhea, 5-6x today, LUE painful to touch.    Clinical Pain Assessment (nonverbal scale for severity on nonverbal patients):   [++++ Clinical Pain Assessment++++]  [++++Pain Severity++++]: Pain: 6  [++++Pain Character++++]: sharp, achy  [++++Pain Duration++++]:  week  [++++Pain Effect++++]: can't use arm or hand due to swelling and pain  [++++Pain Factors++++]: movement, touch  [++++Pain Frequency++++]: constant  [++++Pain Location++++]:    LUE  [++++ Clinical Pain Assessment++++]  Duration: for how long has pt been experiencing pain (e.g., 2 days, 1 month, years)  Frequency: how often pain is an issue (e.g., several times per day, once every few days, constant)     FUNCTIONAL ASSESSMENT:     Palliative Performance Scale (PPS):  PPS: 60       PSYCHOSOCIAL/SPIRITUAL SCREENING:     Palliative IDT has assessed this patient for cultural preferences  / practices and a referral made as appropriate to needs (Cultural Services, Patient Advocacy, Ethics, etc.)    Advance Care Planning:  Advance Care Planning 03/09/2016   Patient's Healthcare Decision Maker is: Named in scanned ACP document   Primary Decision Maker Name Maricus Tanzi   Primary Decision Maker Phone Number [phone]   Primary Decision Maker Relationship to Patient Parent   Secondary Decision Maker Name David [name]   Secondary Decision Maker Phone Number [phone]   Secondary Decision Maker Relationship to Patient Sibling   Confirm Advance Directive Yes, on file       Any spiritual /  religious concerns:  _0  Yes /  _1  No    Caregiver Burnout:  _2  Yes /  _3  No /  _4  No Caregiver Present      Anticipatory grief assessment:   _5  Normal  / _6  Maladaptive       ESAS Anxiety: Anxiety: 0    ESAS Depression: Depression: 0        REVIEW OF SYSTEMS:     Positive and pertinent negative findings in ROS are noted above in HPI.  The following systems were _7  reviewed / _8  unable to be reviewed as noted in HPI  Other findings are noted below.  Systems: constitutional, ears/nose/mouth/throat, respiratory, gastrointestinal, genitourinary, musculoskeletal, integumentary, neurologic, psychiatric, endocrine. Positive findings noted below.  Modified ESAS Completed by: provider   Fatigue: 0 Drowsiness: 0   Depression: 0 Pain: 6   Anxiety: 0 Nausea: 0   Anorexia: 0 Dyspnea: 0     Constipation: No     Stool Occurrence(s): 2 (pt reported 2 diarrhea occurences)        PHYSICAL EXAM:     From RN flowsheet:  Wt Readings from Last 3 Encounters:   03/09/16 246 lb (111.6 kg)   02/12/16 218 lb 7.6 oz (99.1 kg)   01/15/16 235 lb (106.6 kg)     Blood pressure (!) 186/103, pulse 100, temperature 98.3 ??F (36.8 ??C), resp. rate 18, height _9  (1.854 m), weight 246 lb (111.6 kg), SpO2 96 %.    Pain Scale 1: Numeric (0 - 10)  Pain Intensity 1: 6  Pain Onset 1: left arm pain   Pain Location 1: Abdomen, Arm  Pain Orientation 1:  Left  Pain Description 1: Sharp  Pain Intervention(s) 1: Medication (see MAR)  Last bowel movement, if known:     Constitutional: WD, WN, NAD  Eyes: pupils equal, anicteric  ENMT: no nasal discharge, moist mucous membranes  Cardiovascular: regular rhythm, distal pulses intact, Quinton left chest  Respiratory: breathing not labored, symmetric  Gastrointestinal: soft non-tender, +bowel sounds  Musculoskeletal: no deformity, no tenderness to palpation, LUE very swollen, painful to touch  Skin: warm, dry  Neurologic: following commands, moving all extremities  Psychiatric: full affect, no hallucinations          HISTORY:     Active Problems:    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 Adverse effect of anesthesia     sleep apnea uses oxygen at night    ??? Ascites    ??? Asthma    ??? C. difficile colitis    ??? CKD (chronic kidney disease) stage V requiring chronic dialysis ([place])    ??? HTN (hypertension)    ??? SBP (spontaneous bacterial peritonitis) [facility]       Past Surgical History:   Procedure Laterality Date   ??? COLONOSCOPY N/A 03/04/2016    COLONOSCOPY performed by Macie Burows, MD at [facility]   ??? HX HERNIA REPAIR     ??? SIGMOIDOSCOPY,DIAGNOSTIC  03/04/2016           History reviewed. No pertinent family history.   History reviewed, no pertinent family history.  Social History   Substance Use Topics   ??? Smoking status: Heavy Tobacco Smoker     Packs/day: 0.50     Years: 10.00   ??? Smokeless  tobacco: Never Used   ??? Alcohol use No     Allergies   Allergen Reactions   ??? Lisinopril Shortness of Breath   ??? Morphine Shortness of Breath   ??? Toradol [Ketorolac] Shortness of Breath      Current Facility-Administered Medications   Medication Dose Route Frequency   ??? cloNIDine HCl (CATAPRES) tablet 0.2 mg  0.2 mg Oral Q12H   ??? nitroglycerin (NITROSTAT) tablet 0.4 mg  0.4 mg  SubLINGual PRN   ??? acetaminophen (TYLENOL) tablet 650 mg  650 mg Oral Q6H PRN   ??? aspirin chewable tablet 81 mg  81 mg Oral DAILY   ??? psyllium husk-aspartame (METAMUCIL FIBER) packet 1 Packet  1 Packet Oral BID   ??? vancomycin 50 mg/mL oral solution (compounded) 250 mg  250 mg Oral QID   ??? [START ON 03/13/2016] vancomycin 50 mg/mL oral solution (compounded) 250 mg  250 mg Oral BID   ??? [START ON 03/20/2016] vancomycin 50 mg/mL oral solution (compounded) 125 mg  125 mg Oral DAILY   ??? [START ON 03/27/2016] vancomycin 50 mg/mL oral solution (compounded) 125 mg  125 mg Oral EVERY OTHER DAY   ??? [START ON 04/03/2016] vancomycin 50 mg/mL oral solution (compounded) 125 mg  125 mg Oral Q3DAYS   ??? lactobac ac& pc-s.therm-b.anim (FLORA Q/RISAQUAD)  1 Cap Oral DAILY   ??? albuterol (PROVENTIL VENTOLIN) nebulizer solution 2.5 mg  2.5 mg Nebulization Q4H PRN   ??? epoetin alfa (EPOGEN;PROCRIT) injection 10,000 Units  10,000 Units SubCUTAneous Q MON, WED & FRI   ??? cinacalcet (SENSIPAR) tablet 30 mg  30 mg Oral DAILY WITH BREAKFAST   ??? doxercalciferol (HECTOROL) 4 mcg/2 mL injection 2 mcg  2 mcg IntraVENous DIALYSIS MON, WED & FRI   ??? patiromer calcium sorbitex (VELTASSA) powder 8.4 g  8.4 g Oral DAILY   ??? albuterol (PROVENTIL HFA, VENTOLIN HFA, PROAIR HFA) inhaler 2 Puff  2 Puff Inhalation Q6H PRN   ??? sodium chloride (NS) flush 5-10 mL  5-10 mL IntraVENous PRN   ??? amLODIPine (NORVASC) tablet 10 mg  10 mg Oral DAILY   ??? carvedilol (COREG) tablet 12.5 mg  12.5 mg Oral BID WITH MEALS   ??? sevelamer (RENAGEL) tablet 800 mg  800 mg Oral TID WITH MEALS   ??? sodium chloride (NS) flush 5-10 mL  5-10 mL IntraVENous Q8H   ??? ondansetron (ZOFRAN) injection 4 mg  4 mg IntraVENous Q4H PRN   ??? HYDROmorphone (PF) (DILAUDID) injection 2 mg  2 mg IntraVENous Q3H PRN          LAB AND IMAGING FINDINGS:     Lab Results   Component Value Date/Time    WBC 5.1 03/08/2016 03:40 AM    HGB 7.4 03/08/2016 03:40 AM    PLATELET 132 03/08/2016 03:40 AM     Lab Results    Component Value Date/Time    Sodium 140 03/07/2016 09:36 AM    Potassium 4.3 03/07/2016 09:36 AM    Chloride 103 03/07/2016 09:36 AM    CO2 28 03/07/2016 09:36 AM    BUN 31 03/07/2016 09:36 AM    Creatinine 7.51 03/07/2016 09:36 AM    Calcium 7.1 03/07/2016 09:36 AM    Magnesium 1.9 03/07/2016 09:36 AM    Phosphorus 5.2 03/07/2016 09:36 AM      Lab Results   Component Value Date/Time    AST (SGOT) 19 02/29/2016 06:21 AM    Alk. phosphatase 221 02/29/2016 06:21 AM    Protein,  total 6.1 02/29/2016 06:21 AM    Albumin 2.4 02/29/2016 06:21 AM    Globulin 3.7 02/29/2016 06:21 AM     Lab Results   Component Value Date/Time    INR 1.2 12/19/2015 03:58 AM    Prothrombin time 11.9 12/19/2015 03:58 AM    aPTT 32.9 12/19/2015 03:58 AM      Lab Results   Component Value Date/Time    Iron 83 10/15/2015 04:03 AM    TIBC 285 10/15/2015 04:03 AM    Iron % saturation 29 10/15/2015 04:03 AM    Ferritin 1247 10/15/2015 04:03 AM      No results found for: PH, PCO2, PO2  No components found for: [facility]   Lab Results   Component Value Date/Time    CK 1279 03/08/2016 07:14 AM    CK - MB 37.9 03/08/2016 07:14 AM                Total time:   Counseling / coordination time, spent as noted above:   > 50% counseling / coordination?: yes    Prolonged service was provided for  _0 30 min   _1 75 min in face to face time in the presence of the patient, spent as noted above.  Time Start:   Time End:   Note: this can only be billed with [id number] (initial) or [id number] (follow up).  If multiple start / stop times, list each separately.

## 2016-03-09 NOTE — Procedures (Signed)
[facility]  *** FINAL REPORT ***    Name: David LimboMORGAN, Kendry  MRN: [id number]    Inpatient  DOB: 02 Dec 1970  HIS Order #: [id number]  TRAKnet Visit #: [id number]  Date: 29 Feb 2016    TYPE OF TEST: Peripheral Venous Testing    REASON FOR TEST  Limb swelling    Left Arm:-  Deep venous thrombosis:           No  Superficial venous thrombosis:    No      INTERPRETATION/FINDINGS  PROCEDURE:  Venous duplex examination using B-mode, color flow and  spectral Doppler of the upper extremity veins.    Left arm :  1. Deep vein(s) visualized include the internal jugular, subclavian,  axillary, brachial, radial and ulnar veins.  2. No evidence of deep venous thrombosis detected in the veins  visualized.  3. No evidence of deep vein thrombosis in the contralateral subclavian   vein.  4. Superficial vein(s) visualized include the basilic (forearm) vein.  5. No evidence of superficial thrombosis detected.    ADDITIONAL COMMENTS    Upper Basilic and Cephalic veins are now AVFs.  Fistula does  demonstrate flow.    I have personally reviewed the data relevant to the interpretation of  this  study.    TECHNOLOGIST: Wilmer FloorLisa A. Perrino, RVT, RDMS  Signed: 03/01/2016 09:07 AM    PHYSICIAN: Myles RosenthalJeffrey Cooper Moroney, MD  Signed: 03/09/2016 09:02 AM

## 2016-03-10 MED ORDER — HYDROMORPHONE (PF) 2 MG/ML IJ SOLN
2 mg/mL | INTRAMUSCULAR | Status: DC | PRN
Start: 2016-03-10 — End: 2016-03-10
  Administered 2016-03-10: 10:00:00 via INTRAVENOUS

## 2016-03-10 MED ORDER — HYDROMORPHONE 2 MG TAB
2 mg | ORAL | Status: DC | PRN
Start: 2016-03-10 — End: 2016-03-10

## 2016-03-10 MED FILL — VELTASSA 8.4 GRAM ORAL POWDER PACKET: 8.4 gram | ORAL | Qty: 1

## 2016-03-10 MED FILL — HYDROMORPHONE (PF) 1 MG/ML IJ SOLN: 1 mg/mL | INTRAMUSCULAR | Qty: 2

## 2016-03-10 MED FILL — VANCOMYCIN ORAL SOLUTION 50 MG/ML CPD (RX COMPOUNDED): 50 mg/mL | ORAL | Qty: 5

## 2016-03-10 MED FILL — BD POSIFLUSH NORMAL SALINE 0.9 % INJECTION SYRINGE: INTRAMUSCULAR | Qty: 10

## 2016-03-10 MED FILL — PSYLLIUM HUSK (ASPARTAME) 3.4 GRAM ORAL POWDER PACKET: 3.4 gram | ORAL | Qty: 1

## 2016-03-10 MED FILL — CLONIDINE 0.1 MG TAB: 0.1 mg | ORAL | Qty: 2

## 2016-03-10 MED FILL — ONDANSETRON (PF) 4 MG/2 ML INJECTION: 4 mg/2 mL | INTRAMUSCULAR | Qty: 2

## 2016-03-10 MED FILL — HYDROMORPHONE (PF) 2 MG/ML IJ SOLN: 2 mg/mL | INTRAMUSCULAR | Qty: 1

## 2016-03-10 NOTE — Progress Notes (Signed)
Care Manager [facility]([place]nisha  [phone]) regarding the patient.  She stated that a Hep panel needs to be done before the patient could come to dialysis.  CM spoke with Dr Skeet Latchiesla [facility]   CM called and informed her that the patient left AMA.  Salena Saner. [place] RN [phone].

## 2016-03-10 NOTE — Progress Notes (Signed)
Bedside and Verbal shift change report given to Morrie SheldonAshley (Cabin crewoncoming nurse) by Hollace Kinniereion RN (offgoing nurse). Report included the following information Kardex, MAR and Recent Results.    Zone Phone for oncoming shift:       Shift Summary: Resting quietly    LDAs               Peripheral IV 02/29/16 Right Other(comment) (Active)   Site Assessment Clean, dry, & intact 03/09/2016  8:58 PM   Phlebitis Assessment 0 03/09/2016  8:58 PM   Infiltration Assessment 0 03/09/2016  8:58 PM   Dressing Status Clean, dry, & intact 03/09/2016  8:58 PM   Dressing Type Transparent 03/09/2016  8:58 PM   Hub Color/Line Status Pink;Capped 03/09/2016  8:58 PM   Alcohol Cap Used Yes 03/09/2016  8:58 PM        Hemodialysis Catheter (Active)   Central Line Being Utilized Yes 03/09/2016  8:58 PM   Criteria for Appropriate Use Dialysis/apheresis 03/09/2016  8:58 PM   Site Assessment Clean, dry, & intact 03/09/2016  8:58 PM   Date of Last Dressing Change 03/04/16 03/07/2016  8:15 PM   Dressing Status Clean, dry, & intact 03/09/2016  8:58 PM   Dressing Type Disk with Chlorhexadine gluconate (CHG) 03/09/2016  8:58 PM   Proximal Hub Color/Line Status Blue;Capped 03/09/2016  8:58 PM   Distal Hub Color/Line Status Red;Capped 03/09/2016  8:58 PM                    Intake & Output   Date 03/08/16 1900 - 03/09/16 0659 03/09/16 0700 - 03/10/16 0659   Shift 1900-0659 24 Hour Total 0700-1859 1900-0659 24 Hour Total   I  N  T  A  K  E   P.O. 720 720         P.O. 720 720       Shift Total  (mL/kg) 720  (6.4) 720  (6.4)      O  U  T  P  U  T   Urine  (mL/kg/hr)           Urine Occurrence(s)   3 x  3 x    Dialysis  1957         NET Fluid Removed (mL)  1957       Shift Total  (mL/kg)  1957  (17.5)      NET 720 -1237      Weight (kg) 111.8 111.8 111.6 111.6 111.6      Last Bowel Movement Last Bowel Movement Date: 03/09/16   Glucose Checks [x]  N/A  []  AC/HS  []  Q6  Concerns:   Nutrition Active Orders   Diet    DIET RENAL Regular       Consults [] PT  [] OT  [] Speech   [] Case Management   Cardiac Monitoring [x] N/A [] Yes Expires:

## 2016-03-10 NOTE — Progress Notes (Signed)
Attending Hospitalist Attestation:    I have personally seen and examined the patient, reviewed pertinent labs and imaging, and discussed the plan of care with NP AustriaDominica Ko.  I reviewed and agree with the history, exam, assessment and plan as outlined in her note.    "my stomach is still hurting"  Still complains of Nausea and diarrhea  Receiving IV dilaudid, 7 doses per day past few days  Eating, had a TV dinner overnight without problems  Did not get Angioplasty LUE, eat before procedure, Dr Manson PasseyBrown feels this can be done as outpatient   LUE fistula patent, ? Can be used and catheter removed  Refusing to take clonidine today  Stress test yesterday with no ischemia  Discussed with pt to transition to PO pain meds,    Immediately asked to go home    Patient Vitals for the past 24 hrs:   Temp Pulse Resp BP SpO2   03/09/16 2143 - 92 - 190/90 -   03/09/16 1715 98.3 ??F (36.8 ??C) 100 18 (!) 186/103 96 %   03/09/16 1430 98.2 ??F (36.8 ??C) (!) 101 18 162/88 100 %   03/09/16 0806 98.1 ??F (36.7 ??C) (!) 102 18 (!) 177/98 97 %     No distress  Lungs Clear to ausculation bilaterally  Heart: RRR nl S1S2, no murmurs  Abdomen: soft, NT, ND, Positive BS, no rebound  Skin: No rashes  Neuro: Alert, oriented x 3, non focal motor exam      Assessment/plan:  Chest pain recurrent   12 lead ekg shows no stt changes, cardiac enzymes negative. Vitals are normal.  CPK elevated, looks like rhabdomyolysis, stable past 10 days  CXR is WNL  Stress test negative for ischemia, LVEF 60%, inferolateral thinning, ? diaphagmatic attenuation vs old infarct  Outpatient follow up    Elevated CPK needs outpatient follow up, evaluation  No statin for now  ??  Recurrent C difficile POA   10 th episode per pt ????!!!!, very unusual  [place]  C difficile positive in stool, but does not mean infection is active         PCR can given false +  On enteric precautions  Admit CT scan without colitis   Stool transplant planned 11/16, but aborted due to incomplete colon prep   No pseudomembranes            Normal mucosa   Formed stool in rectum  [facility] but was aborted due to not being compliant with colon prep.  Reports diarrhea and N/V, not seen or confirmed by NS, eating well  No indication for high dose IV narcotics, wean to PO, D/W patient    ESRD on HD MWF for 7 years, missed 11/10 session  Hyperkalemia resolved  LUE swelling with central stenosis  HD per renal  Denied by multiple dialysis centers due to non compliance and now his [facility] at [facility] time [facility] until he has a place for dialysis per renal.   [place]  AVF LUE patent, Dr Manson PasseyBrown wondered whether this could be used and the catheter removed  ??  Recurrent Ascites unclear cause needs out pt follow up  CT AP noted, no evidence of cirrhosis, pt denies EtOH use in the past 10 years   States that he has been worked up for this in the past  and it is not because of cirrhosis  SAAG is less than  1.1 so protal HTN is unlikely and Lft's are normal   Echo shows normal EF and no diastolic dysfunction. He does not make any urine to look for urine protein  Will need out pt f/u with his primary gastroenterologist where he claims he had extensive work up including liver biopsy. Multiple attempts at getting records were not successful.  No SBP on     Essential Hypertension POA  BP borderline   on clonidine,coreg and norvasc.     Hx of Hepatitis B  needs out pt f/u on discharge  ??  Obesity POA Body mass index is 32.46 kg/(m^2).  ??  Code status: Full  Prophylaxis: SCD as pt is ambulatory.  Recommended Disposition: Home w/Family    Additional time:    25  min rendering and coordinating care    Tawny AsalJr Lateia Fraser P Ciesla, MD

## 2016-03-10 NOTE — Discharge Summary (Signed)
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  Recurrent Ascites unclear cause needs out pt follow up    Essential Hypertension POA    Chest pain recurrent   ??  Elevated CPK     Hx of Hepatitis B  ????  Obesity POA Body mass index is 32.46 kg/(m^2).  ????  Code status: Full      PCP: None      Summary of admission H+P(copied from Dr Tyrell Antonioave's Note):    CHIEF COMPLAINT: Nausea, vomiting, diarrhea  ??  HISTORY OF PRESENT ILLNESS:     David [name] is a 45 y.o.  African American male who presents with complaints listed above. He states that he has been having these symptoms since Thursday and has been unable to tolerate and PO since. Due to this, he was unable to go to his dialysis session on Friday. He states that he was told 7 years ago that his kidneys are not working any longer. He also states that he has had fluid in his abdomen for a long time. In fact, he has a standing PRN order to go [facility] in [place] every Wednesday for drainage. He states that he went last Wednesday and had 7.5L removed. He denies any EtOH use in the past 10 years.  ??  We were asked to admit for work up and evaluation of the above problems.   ??       Past Medical History:   Diagnosis Date   ??? Ascites ??   ??? Asthma ??   ??? C. difficile colitis ??   ??? CKD (chronic kidney disease) stage V requiring chronic dialysis (HCC) ??   ??? HTN (hypertension) ??   ??? SBP (spontaneous bacterial peritonitis) (HCC) ??      CT ABDOMEN/PELVIS FINDINGS:   LUNG BASES: Linear atelectasis. Small pleural effusions. Otherwise clear.  INCIDENTALLY IMAGED HEART AND MEDIASTINUM: Mild enlargement of the heart. No  pericardial effusion.  LIVER: No mass or biliary dilatation.   GALLBLADDER: Collapsed and not well evaluated.  SPLEEN: No mass.  PANCREAS: No mass or ductal dilatation.  ADRENALS: Unremarkable.  KIDNEYS: Bilateral severe atrophy with no hydronephrosis stone, or mass.  STOMACH: Unremarkable.  SMALL BOWEL: No dilatation or wall thickening.  COLON: No dilatation or wall thickening.  APPENDIX: Unremarkable.  PERITONEUM: Moderate ascites redemonstrated. No pneumoperitoneum.  RETROPERITONEUM: Atherosclerotic calcification of the aorta with no aneurysm or  dissection. No enlarged lymphadenopathy.  REPRODUCTIVE ORGANS: Seminal vesicles and prostate are grossly unremarkable.  URINARY BLADDER: Collapsed with no evident mass or calculus.  BONES: Degenerative spine change and diffusely sclerotic appearance, slightly  more prominent at the vertebral endplates. No aggressive lesion or acute  fracture.  ADDITIONAL COMMENTS: Diffuse soft tissue edema. Anterior abdominal wall hernia  mesh repair anchors.  IMPRESSION:  1. No acute intra-abdominal findings with moderate ascites and small pleural  effusions, and diffuse soft tissue edema.  2. Additional incidental findings as above including mild cardiomegaly and renal  atrophy with renal osteodystrophy.   ??       Hospital  course:     Recurrent C difficile POA   45 th episode per pt ????!!!!, very unusual  Recently admitted in [place] per his report  C??difficile??positive in stool, but does not mean infection is active  ??????????????PCR can given false +  On enteric precautions  Admit CT scan without colitis, WBC 5,400  Stool transplant planned 11/16, but aborted due to incomplete colon prep                        No pseudomembranes                        Normal mucosa                        Formed stool in rectum !!!  Fecal Transplant attempted OSH per report recently but was aborted due to not being compliant with colon prep.  Reports diarrhea and N/V every time I saw him, not seen or confirmed by NS, eating well   No indication for high dose IV narcotics, wean to PO, D/W patient  Once we discussed weaning to Po dilaudid, pt immediately signed out AMA    ESRD on HD MWF for 7 years, missed 11/10 session  Hyperkalemia resolved  LUE swelling with central venous stenosis  HD per renal  Denied by multiple dialysis centers due to non compliance and now his only option at this time is [facility] for dialysis until he has a place for dialysis per renal.   He is planning to go live [place] sister in [place].  AV fistula LUE, appeared function and used in hospital, also has Left subclavian permcath  Dr Manson PasseyBrown planned to take to OR to address stenosis, pt eat before procedure  LE dopplers negative for DVT  Signed out AMA before this could be addressed    Chest pain recurrent   12 lead ekg shows no stt changes, cardiac enzymes negative. Vitals are normal.  CPK elevated, looks like rhabdomyolysis, stable past 10 days  CXR??is WNL  Stress test negative for ischemia, LVEF 60%, inferolateral thinning, ? diaphagmatic attenuation vs old infarct  Outpatient follow up  ??  Elevated CPK needs outpatient follow up, evaluation  No statin for now    Recurrent Ascites unclear cause needs out pt follow up  CT AP noted, no evidence of cirrhosis, pt denies EtOH use in the past 10 years   States that he has been worked up for this in the past and it is not because of cirrhosis  SAAG is less than ??1.1 so portal HTN is unlikely and Lft's are normal   Echo shows normal EF and no diastolic dysfunction. He does not make any urine to look for urine protein  Will need out pt f/u with his primary gastroenterologist where he claims he had extensive work up including liver biopsy. Multiple attempts at getting records were not successful.  No SBP on parecentesis, 3.6 liters removed    Essential Hypertension POA  BP borderline??  on clonidine,coreg and norvasc.??  ??  Hx of Hepatitis B  needs out pt f/u on discharge  ????   Obesity POA Body mass index is 32.46 kg/(m^2).  ????  Code status: Full  Prophylaxis: SCD as pt is ambulatory.  Recommended Disposition:??Home w/Family      Pt will resume home meds except statin      Follow-up Information  Follow up With Details Comments Contact Info    Dr Ree Kidaoopareliya On 03/18/2016 2;00pm  hospital follow-up [address]  [place], [place] [id number]  [phone]    Janann ColonelJeffrey A Brown, MD Schedule an appointment as soon as possible for a visit in 1 week  [address]  [place] [id number]  [phone]      Lamar SprinklesJonathan G Gaspar, MD Schedule an appointment as soon as possible for a visit in 2 weeks For Ascites and recurrent C diff. [address]  [address] Floor, Room [address]  [place] [id number]  [phone]      Primary nephrologist in [place], [place] in 1 week. Schedule an appointment as soon as possible for a visit      None   None (395) Patient stated that they have no PCP            Time spent on discharge:   I spent greater than 30 minutes on discharge, seeing and examining the patient, reconciling home meds and new meds, coordinating care with case management, doing the discharge papers and the D/C summary    Please see my progress note on day of discharge for discharge exam and history    Discharge disposition: signed out AMA    Discharge Condition: Stable    ___________________________________________________    Hospitalist: Tawny AsalJr Parminder Trapani P Ciesla, MD     ___________________________________________________    **Lab Data Reviewed:  No results found for this or any previous visit (from the past 24 hour(s)).

## 2016-03-10 NOTE — Progress Notes (Signed)
Patient requested to speak with the nurse regarding discharge; patient stated " I'm ready to go, there should be discharge orders in"; I explained to the patient that there were no discharge orders in for him at this time; patient stated " i'm leaving, take this IV out"; Dr. Skeet Latchiesla paged regarding patient request to be discharged; per Dr Skeet Latchiesla patient needs to stay for continued care or can sign out AMA; patient educated on benefits of staying in hospital and risks of signing out AMA; AMA form signed; patient discharged and IV removed.

## 2016-03-10 NOTE — Discharge Summary (Signed)
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  Recurrent Ascites unclear cause needs out pt follow up    Essential Hypertension POA    Chest pain recurrent   ??  Elevated CPK     Hx of Hepatitis B  ????  Obesity POA Body mass index is 32.46 kg/(m^2).  ????  Code status: Full      PCP: None      Summary of admission H+P(copied from Dr Tyrell Antonioave's Note):    CHIEF COMPLAINT: Nausea, vomiting, diarrhea  ??  HISTORY OF PRESENT ILLNESS:     David [name] is a 45 y.o.  African American male who presents with complaints listed above. He states that he has been having these symptoms since Thursday and has been unable to tolerate and PO since. Due to this, he was unable to go to his dialysis session on Friday. He states that he was told 7 years ago that his kidneys are not working any longer. He also states that he has had fluid in his abdomen for a long time. In fact, he has a standing PRN order to go [facility] in [place] every Wednesday for drainage. He states that he went last Wednesday and had 7.5L removed. He denies any EtOH use in the past 10 years.  ??  We were asked to admit for work up and evaluation of the above problems.   ??       Past Medical History:   Diagnosis Date   ??? Ascites ??   ??? Asthma ??   ??? C. difficile colitis ??   ??? CKD (chronic kidney disease) stage V requiring chronic dialysis (HCC) ??   ??? HTN (hypertension) ??   ??? SBP (spontaneous bacterial peritonitis) (HCC) ??      CT ABDOMEN/PELVIS FINDINGS:   LUNG BASES: Linear atelectasis. Small pleural effusions. Otherwise clear.  INCIDENTALLY IMAGED HEART AND MEDIASTINUM: Mild enlargement of the heart. No  pericardial effusion.  LIVER: No mass or biliary  dilatation.  GALLBLADDER: Collapsed and not well evaluated.  SPLEEN: No mass.  PANCREAS: No mass or ductal dilatation.  ADRENALS: Unremarkable.  KIDNEYS: Bilateral severe atrophy with no hydronephrosis stone, or mass.  STOMACH: Unremarkable.  SMALL BOWEL: No dilatation or wall thickening.  COLON: No dilatation or wall thickening.  APPENDIX: Unremarkable.  PERITONEUM: Moderate ascites redemonstrated. No pneumoperitoneum.  RETROPERITONEUM: Atherosclerotic calcification of the aorta with no aneurysm or  dissection. No enlarged lymphadenopathy.  REPRODUCTIVE ORGANS: Seminal vesicles and prostate are grossly unremarkable.  URINARY BLADDER: Collapsed with no evident mass or calculus.  BONES: Degenerative spine change and diffusely sclerotic appearance, slightly  more prominent at the vertebral endplates. No aggressive lesion or acute  fracture.  ADDITIONAL COMMENTS: Diffuse soft tissue edema. Anterior abdominal wall hernia  mesh repair anchors.  IMPRESSION:  1. No acute intra-abdominal findings with moderate ascites and small pleural  effusions, and diffuse soft tissue edema.  2. Additional incidental findings as above including mild cardiomegaly and renal  atrophy with renal osteodystrophy.   ??       Hospital  course:     Recurrent C difficile POA   10 th episode per pt ????!!!!, very unusual  Recently admitted in [place] per his report  C??difficile??positive in stool, but does not mean infection is active  ??????????????PCR can given false +  On enteric precautions  Admit CT scan without colitis, WBC 5,400  Stool transplant planned 11/16, but aborted due to incomplete colon prep                        No pseudomembranes                        Normal mucosa                        Formed stool in rectum !!!  Fecal Transplant attempted OSH per report recently but was aborted due to not being compliant with colon prep.  Reports diarrhea and N/V every time I saw him, not seen or confirmed by NS, eating well  No indication for  high dose IV narcotics, wean to PO, D/W patient  Once we discussed weaning to Po dilaudid, pt immediately signed out AMA    ESRD on HD MWF for 7 years, missed 11/10 session  Hyperkalemia resolved  LUE swelling with central venous stenosis  HD per renal  Denied by multiple dialysis centers due to non compliance and now his only option at this time is go to [facility] for dialysis until he has a place for dialysis per renal.   He is planning to go live with his sister in [place].  AV fistula LUE, appeared function and used in hospital, also has Left subclavian permcath  Dr Manson Passey planned to take to OR to address stenosis, pt eat before procedure  LE dopplers negative for DVT  Signed out AMA before this could be addressed    Chest pain recurrent   12 lead ekg shows no stt changes, cardiac enzymes negative. Vitals are normal.  CPK elevated, looks like rhabdomyolysis, stable past 10 days  CXR??is WNL  Stress test negative for ischemia, LVEF 60%, inferolateral thinning, ? diaphagmatic attenuation vs old infarct  Outpatient follow up  ??  Elevated CPK needs outpatient follow up, evaluation  No statin for now    Recurrent Ascites unclear cause needs out pt follow up  CT AP noted, no evidence of cirrhosis, pt denies EtOH use in the past 10 years   States that he has been worked up for this in the past and it is not because of cirrhosis  SAAG is less than ??1.1 so portal HTN is unlikely and Lft's are normal   Echo shows normal EF and no diastolic dysfunction. He does not make any urine to look for urine protein  Will need out pt f/u with his primary gastroenterologist where he claims he had extensive work up including liver biopsy. Multiple attempts at getting records were not successful.  No SBP on parecentesis, 3.6 liters removed    Essential Hypertension POA  BP borderline??  on clonidine,coreg and norvasc.??  ??  Hx of Hepatitis B  needs out pt f/u on discharge  ????  Obesity POA Body mass index is 32.46  kg/(m^2).  ????  Code status: Full  Prophylaxis: SCD as pt is ambulatory.  Recommended Disposition:??Home w/Family      Pt will resume home meds except statin      Follow-up Information  Follow up With Details Comments Contact Info    Dr Ree Kidaoopareliya On 03/18/2016 2;00pm  hospital follow-up [address]  [place], [place] [id number]  [phone]    Janann ColonelJeffrey A Brown, MD Schedule an appointment as soon as possible for a visit in 1 week  [address]  [place] [id number]  [phone]      Lamar SprinklesJonathan G Gaspar, MD Schedule an appointment as soon as possible for a visit in 2 weeks For Ascites and recurrent C diff. [address]  [facility] [address] Floor, Room [address]  [place] [phone]  [phone]      [place] nephrolog[place] in [place], [place] in 1 week. Schedule an appointment as soon as possible for a visit      None   None (395) Patient stated that they have no PCP            Time spent on discharge:   I spent greater than 30 minutes on discharge, seeing and examining the patient, reconciling home meds and new meds, coordinating care with case management, doing the discharge papers and the D/C summary    Please see my progress note on day of discharge for discharge exam and history    Discharge disposition: signed out AMA    Discharge Condition: Stable    ___________________________________________________    Hospitalist: Tawny AsalJr Parminder Trapani P Ciesla, MD     ___________________________________________________    **Lab Data Reviewed:  No results found for this or any previous visit (from the past 24 hour(s)).

## 2016-04-15 ENCOUNTER — Encounter (INDEPENDENT_AMBULATORY_CARE_PROVIDER_SITE_OTHER): Payer: Self-pay | Admitting: Specialist

## 2016-04-26 ENCOUNTER — Encounter (INDEPENDENT_AMBULATORY_CARE_PROVIDER_SITE_OTHER): Payer: Self-pay

## 2016-05-03 ENCOUNTER — Inpatient Hospital Stay: Payer: Medicare Other | Admitting: Nephrology

## 2016-05-03 ENCOUNTER — Inpatient Hospital Stay
Admission: EM | Admit: 2016-05-03 | Discharge: 2016-05-05 | DRG: 391 | Discharge status: Against Medical Advice | Payer: Medicare Other | Attending: Internal Medicine | Admitting: Internal Medicine

## 2016-05-03 ENCOUNTER — Emergency Department: Payer: Medicare Other

## 2016-05-03 DIAGNOSIS — I132 Hypertensive heart and chronic kidney disease with heart failure and with stage 5 chronic kidney disease, or end stage renal disease: Secondary | ICD-10-CM | POA: Diagnosis present

## 2016-05-03 DIAGNOSIS — Z79899 Other long term (current) drug therapy: Secondary | ICD-10-CM

## 2016-05-03 DIAGNOSIS — R188 Other ascites: Secondary | ICD-10-CM | POA: Diagnosis present

## 2016-05-03 DIAGNOSIS — I5043 Acute on chronic combined systolic (congestive) and diastolic (congestive) heart failure: Secondary | ICD-10-CM | POA: Diagnosis present

## 2016-05-03 DIAGNOSIS — Z992 Dependence on renal dialysis: Secondary | ICD-10-CM

## 2016-05-03 DIAGNOSIS — F17210 Nicotine dependence, cigarettes, uncomplicated: Secondary | ICD-10-CM | POA: Diagnosis present

## 2016-05-03 DIAGNOSIS — K529 Noninfective gastroenteritis and colitis, unspecified: Principal | ICD-10-CM | POA: Diagnosis present

## 2016-05-03 DIAGNOSIS — I2510 Atherosclerotic heart disease of native coronary artery without angina pectoris: Secondary | ICD-10-CM | POA: Diagnosis present

## 2016-05-03 DIAGNOSIS — J45909 Unspecified asthma, uncomplicated: Secondary | ICD-10-CM | POA: Diagnosis present

## 2016-05-03 DIAGNOSIS — I252 Old myocardial infarction: Secondary | ICD-10-CM

## 2016-05-03 DIAGNOSIS — I429 Cardiomyopathy, unspecified: Secondary | ICD-10-CM | POA: Diagnosis present

## 2016-05-03 DIAGNOSIS — N186 End stage renal disease: Secondary | ICD-10-CM | POA: Diagnosis present

## 2016-05-03 DIAGNOSIS — Z9115 Patient's noncompliance with renal dialysis: Secondary | ICD-10-CM

## 2016-05-03 MED ORDER — ONDANSETRON HCL 4 MG/2ML IJ SOLN
4.0000 mg | Freq: Once | INTRAMUSCULAR | Status: AC
Start: 2016-05-03 — End: 2016-05-03
  Administered 2016-05-03: 4 mg via INTRAVENOUS
  Filled 2016-05-03: qty 2

## 2016-05-03 NOTE — ED Provider Notes (Signed)
Physician/Midlevel provider first contact with patient: 05/03/16 2254         History     Chief Complaint   Patient presents with   . Emesis   . Diarrhea     Historian: Pt     46 y.o. male with h/o C. Diff diarrhea (October 2017), HTN, kidney failure, and drug-seeking behavior p/w multiple episodes of mild diarrhea beginning 3 days ago. Associated with fever (Tmax 101), abd pain and vomiting. Reports x5-6 episodes of vomiting and diarrhea today. Pt had C. Diff  3 months ago and states he has not had a negative study test done. Pt missed his dialysis appointment today d/t his sxs. Pt normally has dialysis on Mondays, Wednesdays, and Friday. No cough. Pt lives in [place] and is visiting his sister today.     PMD: Pcp, Noneorunknown, MD       12:22 AM    Sign out to Dr. Yetta Barre.                 Past Medical History:   Diagnosis Date   . Asthma without status asthmaticus    . 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 Clostridium difficile diarrhea    . Drug-seeking behavior    . Hypertension    . Kidney failure        Past Surgical History:   Procedure Laterality Date   . AV FISTULA PLACEMENT     . PERITONEAL CATHETER INSERTION         Family History   Problem Relation Age of Onset   . Hypertension Mother        Social  Social History   Substance Use Topics   . Smoking status: Current Every Day Smoker     Packs/day: 0.25     Types: Cigarettes     Last attempt to quit: 09/18/2015   . Smokeless tobacco: Former Neurosurgeon   . Alcohol use No       .     Allergies   Allergen Reactions   . Lisinopril Anaphylaxis   . Toradol [Ketorolac Tromethamine] Anaphylaxis   . Morphine Other (See Comments)     "jittery" per patient, "I don't like the way it makes me feel". Previously charted anaphylaxis, but patient said just "jumpy, jittery"   . Percocet [Oxycodone-Acetaminophen] Other (See Comments)     "jittery" per patient, "I don't like the way it makes me feel"       Home Medications     Med List Status:  In Progress Set By: Hardin Negus, RN at 05/03/2016 10:24 PM                acetaminophen (TYLENOL) 325 MG tablet     Take 2 tablets (650 mg total) by mouth every 4 (four) hours as needed for Pain.     albuterol (PROVENTIL) (2.5 MG/3ML) 0.083% nebulizer solution     Take 2.5 mg by nebulization 2 (two) times daily as needed.         albuterol-ipratropium (COMBIVENT RESPIMAT) 20-100 MCG/ACT Aero Soln     Inhale 2 puffs into the lungs daily.         amLODIPine (NORVASC) 10 MG tablet     Take 10 mg by mouth daily.         calcitRIOL (ROCALTROL) 0.5 MCG capsule     Take 1 capsule (0.5 mcg total) by mouth every mon, wed and fri.     carvedilol (COREG) 25 MG  tablet     Take 25 mg by mouth 2 (two) times daily.     furosemide (LASIX) 80 MG tablet     Take 80 mg by mouth daily.     hydrALAZINE (APRESOLINE) 100 MG tablet     Take 100 mg by mouth 3 (three) times daily.     sevelamer (RENVELA) 800 MG tablet     Take 800 mg by mouth 3 (three) times daily with meals.     vancomycin 50 mg/mL Solution     Take 5 mLs (250 mg total) by mouth every 6 (six) hours.5ml (250mg ) PO Q6h For 7 days then 2.37ml (125mg ) PO q6h for 10 days then stop           Review of Systems   Constitutional: Positive for fever.   Respiratory: Negative for cough.    Gastrointestinal: Positive for abdominal pain, diarrhea and vomiting.   All other systems reviewed and are negative.      Physical Exam    BP: (!) 203/108, Heart Rate: 89, Temp: 98.7 F (37.1 C), Resp Rate: 22, SpO2: 97 %, Weight: 111.1 kg    Physical Exam   Constitutional: He is oriented to person, place, and time. He appears well-developed and well-nourished.   WD male - alert, chronically ill appearing, non-toxic.   HENT:   Head: Normocephalic and atraumatic.   Eyes: Pupils are equal, round, and reactive to light.   Neck: Normal range of motion. Neck supple. No JVD present. No tracheal deviation present. No thyromegaly present.   Cardiovascular: Normal rate, regular rhythm, normal heart sounds and intact distal pulses.   Exam reveals no gallop and no friction rub.    No murmur heard.  Pulmonary/Chest: Effort normal and breath sounds normal. No stridor. No respiratory distress. He has no wheezes. He has no rales. He exhibits no tenderness.   Abdominal: Soft. Bowel sounds are normal. He exhibits no distension and no mass. There is tenderness. There is no rebound and no guarding.   Mild diffuse tenderness - no guarding, rebound.   Musculoskeletal: Normal range of motion. He exhibits no edema or tenderness.   Lymphadenopathy:     He has no cervical adenopathy.   Neurological: He is alert and oriented to person, place, and time. No cranial nerve deficit. He exhibits normal muscle tone. Coordination normal.   Skin: Skin is warm. No rash noted. No erythema. No pallor.   Psychiatric: He has a normal mood and affect. His behavior is normal. Judgment and thought content normal.   Nursing note and vitals reviewed.        MDM and ED Course     ED Medication Orders     Start Ordered     Status Ordering Provider    05/03/16 2334 05/03/16 2333  ondansetron (ZOFRAN) injection 4 mg  Once     Route: Intravenous  Ordered Dose: 4 mg     Acknowledged Leyan Branden         Orders Placed This Encounter   Procedures   . Stool for C. Diff.   . Clostridium difficile toxin B PCR   . XR Chest  AP Portable   . CBC with differential   . Comprehensive metabolic panel   . Magnesium   . Stool For WBC   . GFR   . Cardiac monitoring (ED Only)   . Isolation-Contact Special   . ECG 12 lead   . Saline lock IV     MDM  ED Course      Home Medications     Home medications reviewed by ED MD     Previous Medications    ACETAMINOPHEN (TYLENOL) 325 MG TABLET    Take 2 tablets (650 mg total) by mouth every 4 (four) hours as needed for Pain.    ALBUTEROL (PROVENTIL) (2.5 MG/3ML) 0.083% NEBULIZER SOLUTION    Take 2.5 mg by nebulization 2 (two) times daily as needed.        ALBUTEROL-IPRATROPIUM (COMBIVENT RESPIMAT) 20-100 MCG/ACT AERO SOLN    Inhale 2 puffs into the lungs  daily.        AMLODIPINE (NORVASC) 10 MG TABLET    Take 10 mg by mouth daily.        CALCITRIOL (ROCALTROL) 0.5 MCG CAPSULE    Take 1 capsule (0.5 mcg total) by mouth every mon, wed and fri.    CARVEDILOL (COREG) 25 MG TABLET    Take 25 mg by mouth 2 (two) times daily.    FUROSEMIDE (LASIX) 80 MG TABLET    Take 80 mg by mouth daily.    HYDRALAZINE (APRESOLINE) 100 MG TABLET    Take 100 mg by mouth 3 (three) times daily.    SEVELAMER (RENVELA) 800 MG TABLET    Take 800 mg by mouth 3 (three) times daily with meals.    VANCOMYCIN 50 MG/ML SOLUTION    Take 5 mLs (250 mg total) by mouth every 6 (six) hours.5ml (250mg ) PO Q6h For 7 days then 2.64ml (125mg ) PO q6h for 10 days then stop     EKG, Monitoring, and Splints     EKG (interpreted by ED physician):  NSR, Rate at 95 bpm, No acute changes  Cardiac Monitor (interpreted by ED physician):      Splint Check:     Diagnostic Study Results     The results of the diagnostic studies below were reviewed by the ED provider:    Results     Procedure Component Value Units Date/Time    CBC with differential [[id number]] Collected:  05/03/16 2351    Specimen:  Blood from Blood Updated:  05/03/16 2352    Comprehensive metabolic panel [[id number]] Collected:  05/03/16 2351    Specimen:  Blood Updated:  05/03/16 2352    Magnesium [[id number]] Collected:  05/03/16 2351    Specimen:  Blood Updated:  05/03/16 2352          Radiology Results (24 Hour)     Procedure Component Value Units Date/Time    XR Chest  AP Portable [[id number]] Resulted:  05/03/16 2349    Order Status:  Sent Updated:  05/03/16 2349        Clinical Notes     Critical Care     Total critical care time (excluding procedures):     Scribe and MD Attestations     I, Arty Baumgartner, am serving as a scribe to document services personally performed by Maurine Minister, MD, based on the provider's statements to me.    I, Maurine Minister, MD, personally performed the services documented. Meghan Paticia Stack is scribing for me on  Korpela,Mohsen D. I reviewed and confirm the accuracy of the information in this medical record.     Credentials: Arty Baumgartner, scribe    Rendering Provider: Maurine Minister, MD    Procedures    Clinical Impression & Disposition     Clinical Impression  Final diagnoses:   None      NOTE - sign out to Dr. Yetta Barre  awaiting labs.  ED Disposition     None           New Prescriptions    No medications on file        Treatment Team: Scribe: Arty Baumgartner    Signout     Patient signed out to:  Dr. Yetta Barre at 12:15 AM    Signout notes:  Pending labs and imagining results        Maurine Minister, MD  05/10/16 1410

## 2016-05-03 NOTE — ED Triage Notes (Signed)
Pt to ED c/o diarrhea/vomiting/abd pain since Fri; h/o cdiff in nov; av fistula became infected and prescribed abx -> has not been on abx x 63mo; Tmax 130f this morn -> took tylenol; pt c/o knot by vasc cath on thurs -> dialysis on fri -> pain around knot developed after dialysis

## 2016-05-04 ENCOUNTER — Emergency Department: Payer: Medicare Other

## 2016-05-04 DIAGNOSIS — K529 Noninfective gastroenteritis and colitis, unspecified: Secondary | ICD-10-CM | POA: Diagnosis present

## 2016-05-04 LAB — COMPREHENSIVE METABOLIC PANEL
ALT: 14 U/L (ref 0–55)
AST (SGOT): 25 U/L (ref 5–34)
Albumin/Globulin Ratio: 0.8 — ABNORMAL LOW (ref 0.9–2.2)
Albumin: 2.6 g/dL — ABNORMAL LOW (ref 3.5–5.0)
Alkaline Phosphatase: 298 U/L — ABNORMAL HIGH (ref 38–106)
Anion Gap: 14 (ref 5.0–15.0)
BUN: 51 mg/dL — ABNORMAL HIGH (ref 9–28)
Bilirubin, Total: 0.6 mg/dL (ref 0.2–1.2)
CO2: 22 mEq/L (ref 22–29)
Calcium: 7.1 mg/dL — ABNORMAL LOW (ref 8.5–10.5)
Chloride: 103 mEq/L (ref 100–111)
Creatinine: 9.6 mg/dL — ABNORMAL HIGH (ref 0.7–1.3)
Globulin: 3.1 g/dL (ref 2.0–3.6)
Glucose: 83 mg/dL (ref 70–100)
Potassium: 5.9 mEq/L — ABNORMAL HIGH (ref 3.5–5.1)
Protein, Total: 5.7 g/dL — ABNORMAL LOW (ref 6.0–8.3)
Sodium: 139 mEq/L (ref 136–145)

## 2016-05-04 LAB — GFR: EGFR: 7.2

## 2016-05-04 LAB — CBC AND DIFFERENTIAL
Absolute NRBC: 0 10*3/uL
Basophils Absolute Automated: 0.04 10*3/uL (ref 0.00–0.20)
Basophils Automated: 0.5 %
Eosinophils Absolute Automated: 0.95 10*3/uL — ABNORMAL HIGH (ref 0.00–0.70)
Eosinophils Automated: 10.8 %
Hematocrit: 27.4 % — ABNORMAL LOW (ref 42.0–52.0)
Hgb: 8.7 g/dL — ABNORMAL LOW (ref 13.0–17.0)
Immature Granulocytes Absolute: 0.02 10*3/uL
Immature Granulocytes: 0.2 %
Lymphocytes Absolute Automated: 0.56 10*3/uL (ref 0.50–4.40)
Lymphocytes Automated: 6.4 %
MCH: 30.2 pg (ref 28.0–32.0)
MCHC: 31.8 g/dL — ABNORMAL LOW (ref 32.0–36.0)
MCV: 95.1 fL (ref 80.0–100.0)
MPV: 10 fL (ref 9.4–12.3)
Monocytes Absolute Automated: 0.39 10*3/uL (ref 0.00–1.20)
Monocytes: 4.4 %
Neutrophils Absolute: 6.85 10*3/uL (ref 1.80–8.10)
Neutrophils: 77.7 %
Nucleated RBC: 0 /100 WBC (ref 0.0–1.0)
Platelets: 161 10*3/uL (ref 140–400)
RBC: 2.88 10*6/uL — ABNORMAL LOW (ref 4.70–6.00)
RDW: 16 % — ABNORMAL HIGH (ref 12–15)
WBC: 8.81 10*3/uL (ref 3.50–10.80)

## 2016-05-04 LAB — ECG 12-LEAD
Atrial Rate: 95 {beats}/min
P Axis: 68 degrees
P-R Interval: 168 ms
Q-T Interval: 386 ms
QRS Duration: 76 ms
QTC Calculation (Bezet): 485 ms
R Axis: -23 degrees
T Axis: 75 degrees
Ventricular Rate: 95 {beats}/min

## 2016-05-04 LAB — MAGNESIUM: Magnesium: 1.9 mg/dL (ref 1.6–2.6)

## 2016-05-04 MED ORDER — ONDANSETRON HCL 4 MG/2ML IJ SOLN
4.0000 mg | Freq: Four times a day (QID) | INTRAMUSCULAR | Status: DC | PRN
Start: 2016-05-04 — End: 2016-05-04
  Filled 2016-05-04: qty 2

## 2016-05-04 MED ORDER — HYDROCODONE-ACETAMINOPHEN 5-325 MG PO TABS
1.0000 | ORAL_TABLET | Freq: Four times a day (QID) | ORAL | Status: DC | PRN
Start: 2016-05-04 — End: 2016-05-05
  Administered 2016-05-04 – 2016-05-05 (×4): 1 via ORAL
  Filled 2016-05-04 (×4): qty 1

## 2016-05-04 MED ORDER — FAMOTIDINE 20 MG PO TABS
20.0000 mg | ORAL_TABLET | Freq: Every day | ORAL | Status: DC
Start: 2016-05-04 — End: 2016-05-05
  Administered 2016-05-04 – 2016-05-05 (×2): 20 mg via ORAL
  Filled 2016-05-04 (×3): qty 1

## 2016-05-04 MED ORDER — AMLODIPINE BESYLATE 5 MG PO TABS
10.0000 mg | ORAL_TABLET | Freq: Every day | ORAL | Status: DC
Start: 2016-05-04 — End: 2016-05-05
  Administered 2016-05-04: 10 mg via ORAL
  Filled 2016-05-04 (×3): qty 2

## 2016-05-04 MED ORDER — ONDANSETRON HCL 4 MG/2ML IJ SOLN
4.0000 mg | Freq: Four times a day (QID) | INTRAMUSCULAR | Status: DC | PRN
Start: 2016-05-04 — End: 2016-05-05
  Administered 2016-05-04: 4 mg via INTRAVENOUS

## 2016-05-04 MED ORDER — ACETAMINOPHEN 325 MG PO TABS
650.0000 mg | ORAL_TABLET | Freq: Four times a day (QID) | ORAL | Status: DC | PRN
Start: 2016-05-04 — End: 2016-05-05

## 2016-05-04 MED ORDER — METRONIDAZOLE IN NACL 500 MG/100 ML IV SOLN
500.0000 mg | Freq: Once | INTRAVENOUS | Status: AC
Start: 2016-05-04 — End: 2016-05-04
  Administered 2016-05-04: 500 mg via INTRAVENOUS
  Filled 2016-05-04: qty 100

## 2016-05-04 MED ORDER — PROMETHAZINE HCL 25 MG/ML IJ SOLN
6.2500 mg | Freq: Four times a day (QID) | INTRAMUSCULAR | Status: DC | PRN
Start: 2016-05-04 — End: 2016-05-05
  Administered 2016-05-04: 6.25 mg via INTRAVENOUS
  Filled 2016-05-04: qty 1

## 2016-05-04 MED ORDER — VANCOMYCIN ORAL SOLUTION 50 MG/ML UNIT DOSE
250.0000 mg | Freq: Four times a day (QID) | ORAL | Status: DC
Start: 2016-05-04 — End: 2016-05-05
  Administered 2016-05-04 – 2016-05-05 (×4): 250 mg via ORAL
  Filled 2016-05-04 (×9): qty 5

## 2016-05-04 MED ORDER — PROMETHAZINE HCL 25 MG/ML IJ SOLN
6.2500 mg | Freq: Four times a day (QID) | INTRAMUSCULAR | Status: DC | PRN
Start: 2016-05-04 — End: 2016-05-04

## 2016-05-04 MED ORDER — SEVELAMER CARBONATE 800 MG PO TABS
800.0000 mg | ORAL_TABLET | Freq: Three times a day (TID) | ORAL | Status: DC
Start: 2016-05-04 — End: 2016-05-05
  Administered 2016-05-04 (×2): 800 mg via ORAL
  Filled 2016-05-04 (×4): qty 1

## 2016-05-04 MED ORDER — ALBUTEROL SULFATE (2.5 MG/3ML) 0.083% IN NEBU
2.5000 mg | INHALATION_SOLUTION | Freq: Four times a day (QID) | RESPIRATORY_TRACT | Status: DC | PRN
Start: 2016-05-04 — End: 2016-05-05

## 2016-05-04 MED ORDER — TRAMADOL HCL 50 MG PO TABS
50.0000 mg | ORAL_TABLET | Freq: Two times a day (BID) | ORAL | Status: DC | PRN
Start: 2016-05-04 — End: 2016-05-04
  Filled 2016-05-04: qty 1

## 2016-05-04 MED ORDER — TRAMADOL HCL 50 MG PO TABS
50.0000 mg | ORAL_TABLET | Freq: Four times a day (QID) | ORAL | Status: DC | PRN
Start: 2016-05-04 — End: 2016-05-04

## 2016-05-04 MED ORDER — ONDANSETRON 4 MG PO TBDP
4.0000 mg | ORAL_TABLET | Freq: Four times a day (QID) | ORAL | Status: DC | PRN
Start: 2016-05-04 — End: 2016-05-05

## 2016-05-04 MED ORDER — METRONIDAZOLE IN NACL 500 MG/100 ML IV SOLN
500.0000 mg | Freq: Three times a day (TID) | INTRAVENOUS | Status: DC
Start: 2016-05-04 — End: 2016-05-05
  Administered 2016-05-04 – 2016-05-05 (×2): 500 mg via INTRAVENOUS
  Filled 2016-05-04 (×3): qty 100

## 2016-05-04 MED ORDER — CARVEDILOL 25 MG PO TABS
25.0000 mg | ORAL_TABLET | Freq: Two times a day (BID) | ORAL | Status: DC
Start: 2016-05-04 — End: 2016-05-05
  Administered 2016-05-04: 25 mg via ORAL
  Filled 2016-05-04 (×3): qty 1

## 2016-05-04 MED ORDER — HEPARIN SODIUM (PORCINE) 5000 UNIT/ML IJ SOLN
5000.0000 [IU] | Freq: Three times a day (TID) | INTRAMUSCULAR | Status: DC
Start: 2016-05-04 — End: 2016-05-05

## 2016-05-04 MED ORDER — FUROSEMIDE 40 MG PO TABS
80.0000 mg | ORAL_TABLET | Freq: Every day | ORAL | Status: DC
Start: 2016-05-04 — End: 2016-05-05
  Administered 2016-05-04: 80 mg via ORAL
  Filled 2016-05-04 (×3): qty 2

## 2016-05-04 MED ORDER — HYDRALAZINE HCL 50 MG PO TABS
100.0000 mg | ORAL_TABLET | Freq: Three times a day (TID) | ORAL | Status: DC
Start: 2016-05-04 — End: 2016-05-05
  Filled 2016-05-04 (×2): qty 2

## 2016-05-04 MED ORDER — TRAMADOL HCL 50 MG PO TABS
50.0000 mg | ORAL_TABLET | Freq: Once | ORAL | Status: DC
Start: 2016-05-04 — End: 2016-05-04

## 2016-05-04 MED ORDER — LEVOFLOXACIN IN D5W 500 MG/100ML IV SOLN
500.0000 mg | Freq: Once | INTRAVENOUS | Status: AC
Start: 2016-05-04 — End: 2016-05-04
  Administered 2016-05-04: 500 mg via INTRAVENOUS
  Filled 2016-05-04: qty 100

## 2016-05-04 MED ORDER — TRAMADOL HCL 50 MG PO TABS
50.0000 mg | ORAL_TABLET | Freq: Once | ORAL | Status: AC
Start: 2016-05-04 — End: 2016-05-04
  Administered 2016-05-04: 50 mg via ORAL
  Filled 2016-05-04: qty 1

## 2016-05-04 MED ORDER — SODIUM CHLORIDE 0.9 % IV BOLUS
100.0000 mL | INTRAVENOUS | Status: AC | PRN
Start: 2016-05-04 — End: 2016-05-04

## 2016-05-04 MED ORDER — NALOXONE HCL 0.4 MG/ML IJ SOLN (WRAP)
0.2000 mg | INTRAMUSCULAR | Status: DC | PRN
Start: 2016-05-04 — End: 2016-05-05

## 2016-05-04 MED ORDER — LEVOFLOXACIN IN D5W 250 MG/50ML IV SOLN
250.0000 mg | INTRAVENOUS | Status: DC
Start: 2016-05-06 — End: 2016-05-04

## 2016-05-04 MED ORDER — SODIUM CHLORIDE 0.9 % IV BOLUS
250.0000 mL | INTRAVENOUS | Status: AC | PRN
Start: 2016-05-04 — End: 2016-05-04

## 2016-05-04 MED ORDER — ACETAMINOPHEN 500 MG PO TABS
1000.0000 mg | ORAL_TABLET | Freq: Four times a day (QID) | ORAL | Status: DC | PRN
Start: 2016-05-04 — End: 2016-05-04

## 2016-05-04 MED ORDER — VANCOMYCIN ORAL SOLUTION 50 MG/ML UNIT DOSE
250.0000 mg | Freq: Four times a day (QID) | ORAL | Status: DC
Start: 2016-05-04 — End: 2016-05-04
  Administered 2016-05-04: 250 mg via ORAL
  Filled 2016-05-04 (×4): qty 5

## 2016-05-04 MED ORDER — ACETAMINOPHEN 500 MG PO TABS
1000.0000 mg | ORAL_TABLET | Freq: Once | ORAL | Status: DC
Start: 2016-05-04 — End: 2016-05-05

## 2016-05-04 NOTE — Progress Notes (Signed)
HD Today F160 1st hr 1k, then 2k, 2.5ca, Net UF 2.8L,  pt had 2hr and tx - pt asked to come off the machine left on his tx. C/o stomach pain. Dr Mindi Junker baqui aware. Report given to primary RN.       05/04/16 2015   Treatment Summary   Time Off Machine 2010   Duration of Treatment (Hours) 2.40   Dialyzer Clearance Clear   Fluid Volume Off (mL) 3391   Prime Volume (mL) 250   Rinseback Volume (mL) 250   Fluid Given: Normal Saline (mL) 0   Fluid Given: PRBC  0 mL   Fluid Given: Albumin (mL) 0   Fluid Given: Other (mL) 0   Total Fluid Given 500   Hemodialysis Net Fluid Removed 2891   Post Treatment Assessment   Post-Treatment Weight (Kg) -2.8   Patient Response to Treatment Not good, pt c/o pain, pain med given   Information for Next Treatment Tomorrow   Additional Dialyzer Used no   Permacath/Temporary Catheter Permacath Left   No Placement Date or Time found.   Present on Admission?: Yes  Access Type: Permacath  Orientation: Left  Securement Method: Sutured;Transparent Dressing  Antimicrobial disc applied correctly: No  If antimicrobial disc not used, why?: (c) Other (comme...   Catheter Lumen Volume Venous 2.2 mL   Catheter Lumen Volume Arterial 2.2 mL   Dressing Status and Intervention Dressing Intact;New Dressing;Antibacterial Disc Applied;Clean & Dry   Tego Caps on Catheter Yes   NEW Tego Caps placed (Date) 05/04/16   Vitals   Temp 97.2 F (36.2 C)   Heart Rate 97   Resp Rate 20   BP (!) 183/106   SpO2 100 %   O2 Device Nasal cannula   Assessment   Mental Status Alert;Restless;Agitated;Uncoorporative   Cardiac (WDL) WDL   Cardiac Regularity Regular   Cardiac Rhythm Normal Sinus Rhythm   Respiratory  WDL   Edema  X   Generalized Edema +2   Facial Non Pitting Edema   Sacral UTA   RLE Edema +2   LLE Edema +2   General Skin Color Appropriate for ethnicity   Skin Condition/Temp Warm;Dry   Gastrointestinal (WDL) X   Abdomen Inspection Soft;Rounded   GI Symptoms Cramping   Education   Person taught Patient    Knowledge basis Minimal   Topics taught Access care;S&S of infection   Teaching Tools Explain   Reponse No Evidence of Learning   Bedside Nurse Communication   Name of bedside RN - pre dialysis Brandee   Name of bedside RN - post dialysis Darl Pikes

## 2016-05-04 NOTE — UM Notes (Signed)
** This review is compiled from documentation provided by the treatment team within the patient's medical record. Krystal Eaton, RN, BSN  Clinical Case Manager - Utilization Review  [facility]   [address]   [place], [place] [address]  NPI: [id number]  Tax ID: [id number]  Phone: [phone]  Fax: [phone]     Please use fax number or insurance line to provide authorization for hospital services or to request additional information.     DATE OF REVIEW: 1/15-1/16  1/15 @ 2210 ED Presentation:46 y.o. male with h/o C. Diff diarrhea (October 2017), HTN, kidney failure, and drug-seeking behavior p/w multiple episodes of mild diarrhea beginning 3 days ago. Associated with fever (Tmax 101), abd pain and vomiting. Reports x5-6 episodes of vomiting and diarrhea today. Pt had C. Diff  3 months ago and states he has not had a negative study test done. Pt missed his dialysis appointment today d/t his sxs. Pt normally has dialysis on Mondays, Wednesdays, and Friday. No cough. Pt lives in [place] and is visiting his sister today.     Past Medical History:   Diagnosis Date   . Asthma without status asthmaticus    . 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 Clostridium difficile diarrhea    . Drug-seeking behavior    . Hypertension    . Kidney failure    . Myocardial infarction     2002     ED Triage Vitals   Enc Vitals Group      BP 05/03/16 2205 (!) 203/108      Heart Rate 05/03/16 2205 89      Resp Rate 05/03/16 2205 22      Temp 05/03/16 2205 98.7 F (37.1 C)      Temp Source 05/03/16 2205 Oral      SpO2 05/03/16 2205 97 %      Weight 05/03/16 2205 111.1 kg (245 lb)      Height 05/03/16 2205 1.854 m (6\' 1" )      Pain Score 05/04/16 0241 10     Labs: BUN 51 (H), Creat 9.6 (H), K+ 5.9 (H), Ca+ 7.1 (L), K+ 5.9 (H), Ca+ 7.1 (L), Protein 5.7 (L), Albumin 2.6 (L), Alk Phos 298 (H), A/G Ratio 0.8 (L), H/H 8.7/27.4 (L), EGFR 7.2    Meds: IV Zofran 4mg  x1    Imaging: Ct Abd/ Pelvis Without  Contrast    Result Date: 05/04/2016   1.  Possible mild wall thickening at the rectosigmoid colon could represent colitis or be related to underdistention. 2.  Large volume ascites. 3.  Small bilateral pleural effusions and trace pericardial effusion. 4.  Bilateral perihilar groundglass opacities at the lung bases, possibly pulmonary edema. 5.  Anasarca. 6.  Atrophic kidneys. Demetrios Isaacs, MD 05/04/2016 1:48 AM    Xr Chest  Ap Portable    Result Date: 05/04/2016   Bibasilar atelectasis and small bilateral pleural effusions. Demetrios Isaacs, MD 05/04/2016 1:03 AM    Admit to Medical  Dx: Colitis    Ordered   Start   05/04/16 1158  Admit to Inpatient Once    Status:    Question Answer Comment   Admitting Physician Gerlean Ren, NOOR A    Diagnosis Colitis    Estimated Length of Stay > or = to 2 midnights    Tentative Discharge Plan? Home or Self Care    Patient Class Inpatient  05/04/16 1159     Per H&P:  Acute colitis  - CT A/P with mild rectosigmoid colon thickening and moderate to large volume ascites  - pending stool sample for C. Diff, O&P, culture  - cont IV Levaquin and Flagyl  - ID consulted  - IV zofran/phenergan PRN, Norco PRN for pain, cautious with narcotic use due to hx of drug seeking behavior  - clear liquid diet as tolerated    ESRD on MWF HD  - hx of noncompliance with HD, last HD on Wed, evidence of volume overload  - nephrology consulted for HD today  - cont renvela    Acute on chronic systolic/diastolic CHF: EF 16-10% per last echo from August '17, volume overload due to missed HD, HD today, resume Lasix, monitor I&O, daily weight    Chronic abdominal ascites: slight distended than usual per patient, HD today and tomorrow, may need paracentesis    CAD: clinically stable, cont BB, not on ASA (was told not to take it per nephrology, not on Statin (previous lipid level WNL)    HTN: elevated, resume norvasc, coreg, lasix, hydralazine. HD will help with BP. Monitor.     Asthma: stable, CXR with  atelectasis and small bilateral pleural effusion, O2 support PRN, albuterol PRN.     Tobacco dependence: cessation education of 3 minutes provided, has been trying to quit, declines nicotine patch    Medical noncompliance: will need a new nephrologist and dialysis center upon discharge. Check HBsAb, HBAg, HCAb for dialysis center placement.    Per Neph:   Assessment:   ESRD on HD MWF, non compliant, [name]   Hx of non compliance/nonadherence   Missed HD on Monday   A/W diarrhea/vomiting   Recent Hx of infected LT chest TDC as per patient, completed course of vanc?     Plan:   HD today and then tomorrow to keep on MWF schedule   Low K, 3Ca bath.    Outpatient HD unit arrangement.     Per ID:  Assessment:   Multiple medical problems as listed above.  End-stage renal disease on hemodialysis.  He has not been very compliant with his treatments.  He does have anasarca and ascites which may all be related to inadequate dialysis.  Admitted to the hospital with diarrhea.  He has had numerous bouts of C. difficile in the past.  He has not yet been able to collect a stool for C. difficile  Testing.    Plan:   His laboratories were reviewed.    Presents to the hospital with diarrhea  And lower abdominal pain.  He had fever at home  No other obvious source for any infection.  We will treat empirically for C. difficile pending his testing results.  I do not see a need for Levaquin and this may actually exacerbate his risk for C. difficile.  Discontinue Levaquin.  We will continue with Flagyl and add oral vancomycin 250 mg by mouth every 6 hours.  The patient does seem to respond to antibiotic treatment.  I discussed with him that I am not convinced that a fecal transplant would be any more beneficial.  What we need to try to do this to limit any antibiotic use which is exacerbating his recurrences.    Scheduled Meds:  Current Facility-Administered Medications   Medication Dose Route Frequency   .  acetaminophen  1,000 mg Oral Once   . amLODIPine  10 mg Oral Daily   . carvedilol  25  mg Oral Q12H SCH   . famotidine  20 mg Oral Daily   . furosemide  80 mg Oral Daily   . heparin (porcine)  5,000 Units Subcutaneous Q8H SCH   . hydrALAZINE  100 mg Oral Q8H SCH   . metroNIDAZOLE  500 mg Intravenous Q8H SCH   . sevelamer  800 mg Oral TID MEALS   . vancomycin  250 mg Oral 4 times per day     PRNs: Norco 1 tab x1, IV Zofran 4mg  x1, IV Phenergan 6.25mg  x1    Vitals:    05/04/16 0532 05/04/16 0846 05/04/16 1100 05/04/16 1256   BP: (!) 173/102 (!) 172/101  155/90   Pulse: 99 95  94   Resp: 19 20  18    Temp: 97.9 F (36.6 C) 97.5 F (36.4 C)  97 F (36.1 C)   TempSrc: Oral Oral  Oral   SpO2: 100% 100%  99%   Weight:   106.3 kg (234 lb 6.4 oz)    Height:

## 2016-05-04 NOTE — Progress Notes (Addendum)
HD CARE COORDINATOR NOTE - email referral from hosp CC, Joseph Berkshire. Patient is known to this Clinical research associate from freq hospital admissions at other area hospital.  Pt with history of non-compliance and hospital hopping.      Writer spoke with pt by phone, re-introduced myself and my role. Pt said he does not want to return to [facility] and does not want to go to any OP HD clinic in [place].  Patient said he drives and he is  OK with going to OP HD in [place] or [place]. Writer spoke with Steward Drone, at [facility] [name] who states that pt has not been seen in their clinic since June.  Pt is being discharged from [name] and will be not accepted by any [facility] in [name].     Patient will need CXR, EKG, HBsAb, Hep C Ab, HbAg in order for OP HD to be arranged. New dialysis ctr will also require 2728, care plan and assessments from pt's previous chronic HD clinic. OP HD placement may be challenging as Clinical research associate will have to find neph group that is willing to accept pt.  VM left for Nicoletta Ba, HD Care Coordiantor  [phone]

## 2016-05-04 NOTE — Consults (Signed)
ID CONSULTATION NOTE    Date Time: 05/04/16 12:25 PM  Patient Name: [name], [name]  Requesting Physician: Arneta Cliche, MD       Reason for Consultation:   Diarrhea.  Abdominal pain.  History of recurrent C. difficile    History:   Patient is a 46 year old black male with multiple medical problems.  He has hypertension, asthma, cardiomyopathy.  End-stage renal disease on hemodialysis.  He has had at least 10 bouts of C. difficile diarrhea over the past one year.  Patient states that most recently he was hospitalized and had to have his left arm dialysis fistula removed because of infection.  He states that he was on antibiotics in December.  He now reports he has been having diarrhea for the past couple of days which is foul-smelling.  He is also having some lower abdominal cramping.  He had fevers while at home.  He has been on numerous courses of vancomycin, Flagyl, as well as Dificid.  He states that he does get better when he is having problems with recurrence.  Fecal transplant has been discussed but he has never had a fecal transplant.    He has been relatively noncompliant with his dialysis.  He states that his last dialysis treatment was 6 days ago.    He denies headache.  No shortness of breath or cough.  No nausea or vomiting.  He has been having some diarrhea.  He has lower abdominal cramping.  No chest pain.  He is having pain around his left chest PermCath.  No myalgias or arthralgias.  No rash or itching.    Past Medical History:     Past Medical History:   Diagnosis Date   . Asthma without status asthmaticus    . 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 Clostridium difficile diarrhea    . Drug-seeking behavior    . Hypertension    . Kidney failure    . Myocardial infarction     2002       Past Surgical History:     Past Surgical History:   Procedure Laterality Date   . AV FISTULA PLACEMENT     . HERNIA REPAIR     . PERITONEAL CATHETER INSERTION         Family History:     Family History   Problem  Relation Age of Onset   . Hypertension Mother        Social History:     Social History     Social History   . Marital status: Divorced     Spouse name: N/A   . Number of children: N/A   . Years of education: N/A     Social History Main Topics   . Smoking status: Current Every Day Smoker     Packs/day: 0.25     Years: 10.00     Types: Cigarettes     Last attempt to quit: 09/18/2015   . Smokeless tobacco: Former Neurosurgeon      Comment: declines   . Alcohol use No   . Drug use: No   . Sexual activity: Not on file     Other Topics Concern   . Not on file     Social History Narrative   . No narrative on file       Allergies:     Allergies   Allergen Reactions   . Lisinopril Anaphylaxis   . Toradol [Ketorolac Tromethamine] Anaphylaxis   . Morphine  Other (See Comments)     "jittery" per patient, "I don't like the way it makes me feel". Previously charted anaphylaxis, but patient said just "jumpy, jittery"   . Percocet [Oxycodone-Acetaminophen] Other (See Comments)     "jittery" per patient, "I don't like the way it makes me feel"       Medications:     Current Facility-Administered Medications   Medication Dose Route Frequency   . acetaminophen  1,000 mg Oral Once   . amLODIPine  10 mg Oral Daily   . carvedilol  25 mg Oral Q12H SCH   . famotidine  20 mg Oral Daily   . furosemide  80 mg Oral Daily   . heparin (porcine)  5,000 Units Subcutaneous Q8H SCH   . hydrALAZINE  100 mg Oral Q8H SCH   . metroNIDAZOLE  500 mg Intravenous Q8H SCH   . sevelamer  800 mg Oral TID MEALS   . vancomycin  250 mg Oral 4 times per day       Review of Systems:   He denies headache.  No shortness of breath or cough.  No nausea or vomiting.  He has been having some diarrhea.  He has lower abdominal cramping.  No chest pain.  He is having pain around his left chest PermCath.  No myalgias or arthralgias.  No rash or itching.    Physical Exam:     Vitals:    05/04/16 0846   BP: (!) 172/101   Pulse: 95   Resp: 20   Temp: 97.5 F (36.4 C)   SpO2: 100%      Left chest dialysis PermCath.    Obese black male who appears in no acute distress.  Pupils are reactive.  Oral mucosa is moist.  Lungs are clear.  Heart regular rate and rhythm.  He does have some tenderness over the left PermCath site.  The tenderness is where the catheter goes over his clavicle.  There is no overlying erythema.  No drainage from around the catheter.  Abdomen is obese and soft.  He has some mild left lower quadrant tenderness.  He does have significant fullness of the abdomen  1+ peripheral edema    Labs Reviewed:      Recent CBC WITH DIFF Recent Labs      05/03/16   2351   WBC  8.81   RBC  2.88*   Hgb  8.7*   Hematocrit  27.4*   MCV  95.1   Platelets  161       Recent CMP   Recent Labs      05/03/16   2351   Glucose  83   BUN  51*   Creatinine  9.6*   Sodium  139   Potassium  5.9*   CO2  22   Albumin  2.6*   AST (SGOT)  25   ALT  14   Globulin  3.1         Recent Labs  Lab 05/03/16  2351   WBC 8.81   RBC 2.88*   Hgb 8.7*   Hematocrit 27.4*   MCV 95.1   MCHC 31.8*   RDW 16*   MPV 10.0   Platelets 161         Recent Labs  Lab 05/03/16  2351   Sodium 139   Potassium 5.9*   Chloride 103   CO2 22   BUN 51*   Creatinine 9.6*   Glucose 83   Calcium  7.1*   Magnesium 1.9         Recent Labs  Lab 05/03/16  2351   ALT 14   AST (SGOT) 25   Bilirubin, Total 0.6   Albumin 2.6*   Alkaline Phosphatase 298*       Recent BLOOD CULTURE No results for input(s): CXBLD in the last 24 hours.  Recent URINE CULTURE Invalid input(s): CXURN    MICRO:    / blood cx    Rads:   Ct Abd/ Pelvis Without Contrast    Result Date: 05/04/2016   1.  Possible mild wall thickening at the rectosigmoid colon could represent colitis or be related to underdistention. 2.  Large volume ascites. 3.  Small bilateral pleural effusions and trace pericardial effusion. 4.  Bilateral perihilar groundglass opacities at the lung bases, possibly pulmonary edema. 5.  Anasarca. 6.  Atrophic kidneys. Demetrios Isaacs, MD 05/04/2016 1:48 AM    Xr Chest  Ap  Portable    Result Date: 05/04/2016   Bibasilar atelectasis and small bilateral pleural effusions. Demetrios Isaacs, MD 05/04/2016 1:03 AM          Assessment:   Multiple medical problems as listed above.  End-stage renal disease on hemodialysis.  He has not been very compliant with his treatments.  He does have anasarca and ascites which may all be related to inadequate dialysis.  Admitted to the hospital with diarrhea.  He has had numerous bouts of C. difficile in the past.  He has not yet been able to collect a stool for C. difficile  Testing.    Plan:   His laboratories were reviewed.    Presents to the hospital with diarrhea  And lower abdominal pain.  He had fever at home  No other obvious source for any infection.  We will treat empirically for C. difficile pending his testing results.  I do not see a need for Levaquin and this may actually exacerbate his risk for C. difficile.  Discontinue Levaquin.  We will continue with Flagyl and add oral vancomycin 250 mg by mouth every 6 hours.  The patient does seem to respond to antibiotic treatment.  I discussed with him that I am not convinced that a fecal transplant would be any more beneficial.  What we need to try to do this to limit any antibiotic use which is exacerbating his recurrences.    Consider vascular surgery evaluation for follow-up of his left arm surgical site.  He was seen by Dr. Keith Rake, M.D.  [facility], [facility]  Pager [phone]  [phone]

## 2016-05-04 NOTE — Plan of Care (Signed)
Problem: Pain  Goal: Pain at adequate level as identified by patient  Outcome: Progressing   05/04/16 2130   Goal/Interventions addressed this shift   Pain at adequate level as identified by patient Identify patient comfort function goal;Assess for risk of opioid induced respiratory depression, including snoring/sleep apnea. Alert healthcare team of risk factors identified.;Assess pain on admission, during daily assessment and/or before any "as needed" intervention(s);Reassess pain within 30-60 minutes of any procedure/intervention, per Pain Assessment, Intervention, Reassessment (AIR) Cycle;Evaluate if patient comfort function goal is met;Evaluate patient's satisfaction with pain management progress;Offer non-pharmacological pain management interventions;Include patient/patient care companion in decisions related to pain management as needed   Admitted to room 490 via stretcher from ED for colitis. Stool needed yet pt unable to produce at this time. C/o L chest perma cath pain and abd pain 10/10. Md notified of complaint as well as elevated bp. Tylenol ordered with pt refusing. Iv abx administered. Npo.

## 2016-05-04 NOTE — Progress Notes (Signed)
Md aware of vs. No orders to treat current bp.

## 2016-05-04 NOTE — Progress Notes (Signed)
[facility] [place], [employer].  Progress Note    Assessment:   ESRD on HD MWF, non compliant, [name]   Hx of non compliance/nonadherence   Missed HD on Monday   A/W diarrhea/vomiting   Recent Hx of infected LT chest TDC as per patient, completed course of vanc?     Plan:   HD today and then tomorrow to keep on MWF schedule   Low K, 3Ca bath.    Outpatient HD unit arrangement.     Darlyne Russian, MD  Office [phone]    ++++++++++++++++++++++++++++++++++++++++++++++++++++++++++++++  Subjective:  [name] p/w complaints of diarrhea/vomiting    Medications:  Scheduled Meds:  Current Facility-Administered Medications   Medication Dose Route Frequency   . acetaminophen  1,000 mg Oral Once   . amLODIPine  10 mg Oral Daily   . carvedilol  25 mg Oral Q12H SCH   . furosemide  80 mg Oral Daily   . heparin (porcine)  5,000 Units Subcutaneous Q8H SCH   . hydrALAZINE  100 mg Oral Q8H SCH   . [START ON 05/06/2016] levoFLOXacin  250 mg Intravenous Q48H   . metroNIDAZOLE  500 mg Intravenous Q8H SCH   . sevelamer  800 mg Oral TID MEALS     Continuous Infusions:  PRN Meds:acetaminophen, albuterol, HYDROcodone-acetaminophen, naloxone, ondansetron **OR** ondansetron, promethazine    Objective:  Vital signs in last 24 hours:  Temp:  [97.4 F (36.3 C)-98.7 F (37.1 C)] 97.5 F (36.4 C)  Heart Rate:  [89-99] 95  Resp Rate:  [18-22] 20  BP: (171-203)/(101-108) 172/101  Intake/Output last 24 hours:    Intake/Output Summary (Last 24 hours) at 05/04/16 1113  Last data filed at 05/04/16 0500   Gross per 24 hour   Intake                0 ml   Output                0 ml   Net                0 ml     Intake/Output this shift:  No intake/output data recorded.    Physical Exam:   Gen: WD WN NAD   CV: S1 S2 N RRR   Chest: Decr BS @ bases   Ab: ND NT soft no HSM +BS   Ext: ++ edema    Labs:    Recent Labs  Lab 05/03/16  2351   Glucose 83   BUN 51*   Creatinine 9.6*   Calcium 7.1*   Sodium 139   Potassium  5.9*   Chloride 103   CO2 22   Albumin 2.6*   Magnesium 1.9       Recent Labs  Lab 05/03/16  2351   WBC 8.81   Hgb 8.7*   Hematocrit 27.4*   MCV 95.1   MCH 30.2   MCHC 31.8*   RDW 16*   MPV 10.0   Platelets 161

## 2016-05-04 NOTE — Progress Notes (Addendum)
HD CARE COORDINATOR -  Pt is  providing conflicting information regarding his OP HD treatments. Writer made 2nd call to [facility] [name] [phone] and spoke with Efraim Kaufmann, clinic SW who confirms that pt last treated at their clinic on June 6th, however he still remains on their census and is planned to be discharged from the neph group effective Jan 19th.  Raynelle Fanning states that pt is homeless and lives out of his truck. Melissa also reports that pt has been at hospitals throughout [place] as far [employer] [place]. Melissa also reports that pt has lived at extended stay hotels. 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  Writer is waiting on call back from clinic mgr Raynelle Fanning.     Gypsy Decant, HD Care Coordinator  [phone]

## 2016-05-04 NOTE — Plan of Care (Signed)
Pt. Refuses to take blood medication until dialysis is complete. Pt. Advised to take at least one of four blood medications to help lower increasing blood pressure. Pt. States he knows his body, and that he doesn't care how high his blood pressure gets he will not take the medication until dialysis is finished.

## 2016-05-04 NOTE — Plan of Care (Signed)
Problem: Safety  Goal: Patient will be free from injury during hospitalization  Outcome: Progressing   05/04/16 1759   Goal/Interventions addressed this shift   Patient will be free from injury during hospitalization  Assess patient's risk for falls and implement fall prevention plan of care per policy;Provide and maintain safe environment;Use appropriate transfer methods;Ensure appropriate safety devices are available at the bedside;Include patient/ family/ care giver in decisions related to safety;Hourly rounding;Assess for patients risk for elopement and implement Elopement Risk Plan per policy   Pt. Currently receiving hemodialysis. Pt. Laying in bed call light within reach. All safety measures maintained.

## 2016-05-04 NOTE — H&P (Signed)
[facility]      Patient: [name]  Date: 05/03/2016   DOB: 08/02/1970  Admission Date: 05/03/2016   MRN: [id number]  Attending: Arneta Cliche, MD       Chief Complaint   Patient presents with   . Emesis   . Diarrhea      History Gathered From: patient and record    HISTORY AND PHYSICAL     [name] is a 46 y.o. male with a PMHx of ESRD on HD, CAD, CHF, HTN, asthma, tobacco dependence, medical noncompliance who presented with abdominal pain, nausea, vomiting, diarrhea since Friday. He is visiting his sister from [place] and started having vomiting and diarrhea along with abdominal pain since Friday. His symptoms persisted yesterday with fever 101F and 6 episodes of diarrhea that he presented to ED. He had a similar episode in October when he was diagnosed with C. diff colitis and treated with PO vancomycin at the time. He has been feeling well otherwise and denies any recent use of antibiotics. He is on MWF HD and his last HD was on last Wed. He reports to be compliant with HD and medications at home. He reports increased swelling in his legs and abdomen. He denies chest pain, SOB, or cough. Of note, there has been a history of noncompliance with hemodialysis, drug seeking behavior, and hospital hopping per record. He has been discharged from [name] and will need a new dialysis center upon discharge.     Past Medical History:   Diagnosis Date   . Asthma without status asthmaticus    . 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 Clostridium difficile diarrhea    . Drug-seeking behavior    . Hypertension    . Kidney failure    . Myocardial infarction     2002       Past Surgical History:   Procedure Laterality Date   . AV FISTULA PLACEMENT     . HERNIA REPAIR     . PERITONEAL CATHETER INSERTION         Prior to Admission medications    Medication Sig Start Date End Date Taking? Authorizing Provider   acetaminophen (TYLENOL) 325 MG tablet Take 2 tablets (650 mg total) by  mouth every 4 (four) hours as needed for Pain. 01/08/16   Clydie Braun, PA   albuterol (PROVENTIL) (2.5 MG/3ML) 0.083% nebulizer solution Take 2.5 mg by nebulization 2 (two) times daily as needed.        [provider]   albuterol-ipratropium (COMBIVENT RESPIMAT) 20-100 MCG/ACT Aero Soln Inhale 2 puffs into the lungs daily.        [provider]   amLODIPine (NORVASC) 10 MG tablet Take 10 mg by mouth daily.        [provider]   carvedilol (COREG) 25 MG tablet Take 25 mg by mouth 2 (two) times daily.    [provider]   furosemide (LASIX) 80 MG tablet Take 80 mg by mouth daily.    [provider]   hydrALAZINE (APRESOLINE) 100 MG tablet Take 100 mg by mouth 3 (three) times daily.    [provider]   sevelamer (RENVELA) 800 MG tablet Take 800 mg by mouth 3 (three) times daily with meals.    [provider]   calcitRIOL (ROCALTROL) 0.5 MCG capsule Take 1 capsule (0.5 mcg total) by mouth every mon, wed and fri. 02/09/16 05/04/16  [name]  Jorge Gills, MD   vancomycin 50 mg/mL Solution Take 5 mLs (250 mg total) by mouth every 6 (six) hours.5ml (250mg ) PO Q6h For 7 days then 2.42ml (125mg ) PO q6h for 10 days then stop 02/06/16 05/04/16  Roanna Epley, MD       Allergies   Allergen Reactions   . Lisinopril Anaphylaxis   . Toradol [Ketorolac Tromethamine] Anaphylaxis   . Morphine Other (See Comments)     "jittery" per patient, "I don't like the way it makes me feel". Previously charted anaphylaxis, but patient said just "jumpy, jittery"   . Percocet [Oxycodone-Acetaminophen] Other (See Comments)     "jittery" per patient, "I don't like the way it makes me feel"       CODE STATUS: full code    PRIMARY CARE MD: Pcp, Noneorunknown, MD    Family History   Problem Relation Age of Onset   . Hypertension Mother        Social History   Substance Use Topics   . Smoking status: Current Every Day Smoker     Packs/day: 0.25     Years:  10.00     Types: Cigarettes     Last attempt to quit: 09/18/2015   . Smokeless tobacco: Former Neurosurgeon      Comment: declines   . Alcohol use No       REVIEW OF SYSTEMS     All 10 systems reviewed and within limit except as noted above    PHYSICAL EXAM     Vital Signs (most recent): BP (!) 172/101   Pulse 95   Temp 97.5 F (36.4 C) (Oral)   Resp 20   Ht 1.854 m (6\' 1" )   Wt 111.1 kg (245 lb)   SpO2 100%   BMI 32.32 kg/m   CONSTITUTIONAL: no acute distress, resting comfortably in bed, well developed and nourished  HEENT: normocephalic, clear conjunctiva  NECK: Supple, midline trachea  CARDIOVASCULAR: RRR, normal S1, S2; No murmur, rub, gallop  LUNGS: normal rate and effort, clear to auscultation; No Wheeze, rhonchi, or rale  ABDOMEN: soft, tenderness on lower abdomen, distended, positive bowel sounds, no rebound or guarding  GENITOURINARY: no suprapubic or CVA tenderness  MUSCULOSKELETAL: intact ROM and motor strength, no obvious deformities  EXTREMITIES: mild to moderate BLE edema, no calf tenderness, intact pedal pulses  SKIN: no rash or ecchymosis  NEURO: A&Ox3, no focal neurological deficits  PSYCH: calm and cooperative    LABS & IMAGING     Recent Results (from the past 24 hour(s))   CBC with differential    Collection Time: 05/03/16 11:51 PM   Result Value Ref Range    WBC 8.81 3.50 - 10.80 x10 3/uL    Hgb 8.7 (L) 13.0 - 17.0 g/dL    Hematocrit 16.1 (L) 42.0 - 52.0 %    Platelets 161 140 - 400 x10 3/uL    RBC 2.88 (L) 4.70 - 6.00 x10 6/uL    MCV 95.1 80.0 - 100.0 fL    MCH 30.2 28.0 - 32.0 pg    MCHC 31.8 (L) 32.0 - 36.0 g/dL    RDW 16 (H) 12 - 15 %    MPV 10.0 9.4 - 12.3 fL    Neutrophils 77.7 None %    Lymphocytes Automated 6.4 None %    Monocytes 4.4 None %    Eosinophils Automated 10.8 None %    Basophils Automated 0.5 None %    Immature Granulocyte 0.2 None %  Nucleated RBC 0.0 0.0 - 1.0 /100 WBC    Neutrophils Absolute 6.85 1.80 - 8.10 x10 3/uL    Abs Lymph Automated 0.56 0.50 - 4.40 x10 3/uL    Abs  Mono Automated 0.39 0.00 - 1.20 x10 3/uL    Abs Eos Automated 0.95 (H) 0.00 - 0.70 x10 3/uL    Absolute Baso Automated 0.04 0.00 - 0.20 x10 3/uL    Absolute Immature Granulocyte 0.02 0 x10 3/uL    Absolute NRBC 0.00 0 x10 3/uL   Comprehensive metabolic panel    Collection Time: 05/03/16 11:51 PM   Result Value Ref Range    Glucose 83 70 - 100 mg/dL    BUN 51 (H) 9 - 28 mg/dL    Creatinine 9.6 (H) 0.7 - 1.3 mg/dL    Sodium 161 096 - 045 mEq/L    Potassium 5.9 (H) 3.5 - 5.1 mEq/L    Chloride 103 100 - 111 mEq/L    CO2 22 22 - 29 mEq/L    Calcium 7.1 (L) 8.5 - 10.5 mg/dL    Protein, Total 5.7 (L) 6.0 - 8.3 g/dL    Albumin 2.6 (L) 3.5 - 5.0 g/dL    AST (SGOT) 25 5 - 34 U/L    ALT 14 0 - 55 U/L    Alkaline Phosphatase 298 (H) 38 - 106 U/L    Bilirubin, Total 0.6 0.2 - 1.2 mg/dL    Globulin 3.1 2.0 - 3.6 g/dL    Albumin/Globulin Ratio 0.8 (L) 0.9 - 2.2    Anion Gap 14.0 5.0 - 15.0   Magnesium    Collection Time: 05/03/16 11:51 PM   Result Value Ref Range    Magnesium 1.9 1.6 - 2.6 mg/dL   GFR    Collection Time: 05/03/16 11:51 PM   Result Value Ref Range    EGFR 7.2          IMAGING:  CT Abd/ Pelvis without Contrast   Final Result       1.  Possible mild wall thickening at the rectosigmoid colon could   represent colitis or be related to underdistention.   2.  Large volume ascites.   3.  Small bilateral pleural effusions and trace pericardial effusion.   4.  Bilateral perihilar groundglass opacities at the lung bases,   possibly pulmonary edema.   5.  Anasarca.   6.  Atrophic kidneys.      Demetrios Isaacs, MD    05/04/2016 1:48 AM      XR Chest  AP Portable   Final Result          Bibasilar atelectasis and small bilateral pleural effusions.      Demetrios Isaacs, MD    05/04/2016 1:03 AM          CARDIAC:  EKG Interpretation:  NSR, HR 95        ASSESSMENT & PLAN     HERSHEL CORKERY is a 46 y.o. male with a PMHx of ESRD on HD, CAD, CHF, HTN, asthma, tobacco dependence, medical noncompliance who presented with abdominal pain, nausea,  vomiting, diarrhea since Friday.    Acute colitis  - CT A/P with mild rectosigmoid colon thickening and moderate to large volume ascites  - pending stool sample for C. Diff, O&P, culture  - cont IV Levaquin and Flagyl  - ID consulted  - IV zofran/phenergan PRN, Norco PRN for pain, cautious with narcotic use due to hx of drug seeking behavior  -  clear liquid diet as tolerated    ESRD on MWF HD  - hx of noncompliance with HD, last HD on Wed, evidence of volume overload  - nephrology consulted for HD today  - cont renvela    Acute on chronic systolic/diastolic CHF: EF 16-10% per last echo from August '17, volume overload due to missed HD, HD today, resume Lasix, monitor I&O, daily weight    Chronic abdominal ascites: slight distended than usual per patient, HD today and tomorrow, may need paracentesis    CAD: clinically stable, cont BB, not on ASA (was told not to take it per nephrology, not on Statin (previous lipid level WNL)    HTN: elevated, resume norvasc, coreg, lasix, hydralazine. HD will help with BP. Monitor.     Asthma: stable, CXR with atelectasis and small bilateral pleural effusion, O2 support PRN, albuterol PRN.     Tobacco dependence: cessation education of 3 minutes provided, has been trying to quit, declines nicotine patch    Medical noncompliance: will need a new nephrologist and dialysis center upon discharge. Check HBsAb, HBAg, HCAb for dialysis center placement.     GI prophylaxis: Pepcid  DVT prophylaxis:  CHEST guideline (See page e199S) Chemical   Foley: Not present   IVs:  Peripheral IV   PT/OT: Not needed   Daily CBC & or Chem ordered:  SHM/ABIM guidelines (see #5) Yes, due to clinical and lab instability     Admitted under observation status from ED but will be changed to inpatient status as he will stay more than 2 midnights for further monitoring of diarrhea, pending stool studies, and HD for volume overload.       Signed,  Jena Gauss, NP  05/04/2016 10:02 AM

## 2016-05-04 NOTE — ED Provider Notes (Signed)
ED Signout     Patient received from: Dr. Arlyss Repress at 12:15am    Signout notes: Pending labs and imaging results      Diagnostic Study Results     The results of the diagnostic studies below were reviewed by the ED provider:    Labs  Results     Procedure Component Value Units Date/Time    Comprehensive metabolic panel [[id number]]  (Abnormal) Collected:  05/03/16 2351    Specimen:  Blood Updated:  05/04/16 0018     Glucose 83 mg/dL      BUN 51 (H) mg/dL      Creatinine 9.6 (H) mg/dL      Sodium 409 mEq/L      Potassium 5.9 (H) mEq/L      Chloride 103 mEq/L      CO2 22 mEq/L      Calcium 7.1 (L) mg/dL      Protein, Total 5.7 (L) g/dL      Albumin 2.6 (L) g/dL      AST (SGOT) 25 U/L      ALT 14 U/L      Alkaline Phosphatase 298 (H) U/L      Bilirubin, Total 0.6 mg/dL      Globulin 3.1 g/dL      Albumin/Globulin Ratio 0.8 (L)     Anion Gap 14.0    Magnesium [[id number]] Collected:  05/03/16 2351    Specimen:  Blood Updated:  05/04/16 0018     Magnesium 1.9 mg/dL     GFR [[id number]] Collected:  05/03/16 2351     Updated:  05/04/16 0018     EGFR 7.2    CBC with differential [[id number]]  (Abnormal) Collected:  05/03/16 2351    Specimen:  Blood from Blood Updated:  05/04/16 0001     WBC 8.81 x10 3/uL      Hgb 8.7 (L) g/dL      Hematocrit 52.8 (L) %      Platelets 161 x10 3/uL      RBC 2.88 (L) x10 6/uL      MCV 95.1 fL      MCH 30.2 pg      MCHC 31.8 (L) g/dL      RDW 16 (H) %      MPV 10.0 fL      Neutrophils 77.7 %      Lymphocytes Automated 6.4 %      Monocytes 4.4 %      Eosinophils Automated 10.8 %      Basophils Automated 0.5 %      Immature Granulocyte 0.2 %      Nucleated RBC 0.0 /100 WBC      Neutrophils Absolute 6.85 x10 3/uL      Abs Lymph Automated 0.56 x10 3/uL      Abs Mono Automated 0.39 x10 3/uL      Abs Eos Automated 0.95 (H) x10 3/uL      Absolute Baso Automated 0.04 x10 3/uL      Absolute Immature Granulocyte 0.02 x10 3/uL      Absolute NRBC 0.00 x10 3/uL           Radiologic Studies  Radiology Results  (24 Hour)     Procedure Component Value Units Date/Time    CT Abd/ Pelvis without Contrast [[id number]] Collected:  05/04/16 0135    Order Status:  Completed Updated:  05/04/16 0152    Narrative:       HISTORY: Abdominal pain.    COMPARISON:  CT abdomen pelvis 11/10/2015    TECHNIQUE: CT abdomen and pelvis without IV contrast. The following dose  reduction techniques were utilized: automated exposure control and/or  adjustment of the mA and/or kV according to patient size, and/or the use  iterative reconstruction technique.    FINDINGS: Evaluation of the organs is limited without IV contrast. There  is a trace pericardial effusion. There are small bilateral pleural  effusions. Perihilar groundglass opacities are noted in the lower lobes,  right greater than left. The abdominal aorta is nonaneurysmal. The liver  appears unremarkable. The gallbladder is not visualized and may be  contracted or obscured by ascites. The spleen is borderline enlarged.  Tiny calcification seen at the pancreatic tail may represent a vascular  calcification or sequela of chronic pancreatitis. No other focal  pancreatic abnormality is seen. There is no adrenal nodule. The kidneys  are markedly atrophic bilaterally. The stomach is underdistended and not  well assessed. The small bowel is unobstructed. The appendix appears  normal in caliber with intraluminal contrast. There is suggested mild  colonic wall thickening at the rectosigmoid colon which may be related  to underdistention. There is diverticulosis. There is moderate to large  ascites. The urinary bladder is decompressed and not well assessed.  Postsurgical changes from prior ventral hernia repair noted. There is  generalized body wall edema. Chronic changes noted in the vertebral  bodies suggestive of chronic renal disease. A Schmorl's node is noted at  L2.      Impression:          1.  Possible mild wall thickening at the rectosigmoid colon could  represent colitis or be related to  underdistention.  2.  Large volume ascites.  3.  Small bilateral pleural effusions and trace pericardial effusion.  4.  Bilateral perihilar groundglass opacities at the lung bases,  possibly pulmonary edema.  5.  Anasarca.  6.  Atrophic kidneys.    Demetrios Isaacs, MD   05/04/2016 1:48 AM    XR Chest  AP Portable [[id number]] Collected:  05/04/16 0100    Order Status:  Completed Updated:  05/04/16 0107    Narrative:       HISTORY: Vomiting. End-stage renal disease.    COMPARISON: 12/30/2015    Portable chest: Left-sided large bore central venous catheter tip  projects over the right atrium. A vascular stent is projected over the  superior right hemithorax. There is stable enlargement of the cardiac  silhouette. There is bibasilar atelectasis and bilateral small pleural  effusions. There is no pneumothorax.      Impression:            Bibasilar atelectasis and small bilateral pleural effusions.    Demetrios Isaacs, MD   05/04/2016 1:03 AM          Vital Signs  BP (!) 174/108   Pulse 99   Temp 97.4 F (36.3 C) (Oral)   Resp 18   Ht 6\' 1"  (1.854 m)   Wt 111.1 kg   SpO2 98%   BMI 32.32 kg/m   Patient Vitals for the past 24 hrs:   BP Temp Temp src Pulse Resp SpO2 Height Weight   05/04/16 0326 - - - - - - 6\' 1"  (1.854 m) 111.1 kg   05/04/16 0302 (!) 174/108 97.4 F (36.3 C) Oral 99 18 98 % - -   05/04/16 0241 (!) 178/105 - - 98 18 98 % - -   05/04/16 0144 (!) 171/103 - - - - - - -  05/04/16 0143 - - - 98 18 100 % - -   05/03/16 2205 (!) 203/108 98.7 F (37.1 C) Oral 89 22 97 % 6\' 1"  (1.854 m) 111.1 kg         Scribe and MD Attestations     I have assumed care of this patient: Yes    I, Venancio Poisson, MD, personally performed the services documented.  Roxy Cedar Hoegle is scribing for me on Freeney,Krayton D. I reviewed and confirm the accuracy of the information in this medical record.     I, Juleen Starr, am serving as a scribe to document services personally performed by Venancio Poisson, MD based on the provider's  statements to me.     Rendering Provider: Dr. Arlyss Repress    Monitors, EKG     Cardiac Monitor (interpreted by ED physician):  N/A    EKG (interpreted by ED physician): N/A    Clinical Course / MDM     Notes:   2:18am- D/w Dr. Mariane Masters (hospitalist). Will admit for colitis, most likely c.diff. Pt received treatment at [facility] earlier today. Dr. Mariane Masters requests med obs.    Clinical Impression:  1. Colitis        ED Disposition     ED Disposition Condition Date/Time Comment    Observation  Tue May 04, 2016  2:23 AM Admitting Physician: Hope Budds ADEL  [[id number]]   Diagnosis: Colitis [604540]   Estimated Length of Stay: < 2 midnights   Tentative Discharge Plan?: Home or Self Care [1]   Patient Class: Observation [104]   Bed request comments: Dx. colitis. med gen obs. fall risk. dialysis, c.diff               Venancio Poisson, MD  05/04/16 [phone]

## 2016-05-05 LAB — RENAL FUNCTION PANEL
Albumin: 2.3 g/dL — ABNORMAL LOW (ref 3.5–5.0)
Anion Gap: 12 (ref 5.0–15.0)
BUN: 37 mg/dL — ABNORMAL HIGH (ref 9–28)
CO2: 25 mEq/L (ref 22–29)
Calcium: 6.9 mg/dL — ABNORMAL LOW (ref 8.5–10.5)
Chloride: 103 mEq/L (ref 100–111)
Creatinine: 8.2 mg/dL — ABNORMAL HIGH (ref 0.7–1.3)
Glucose: 137 mg/dL — ABNORMAL HIGH (ref 70–100)
Phosphorus: 5.8 mg/dL — ABNORMAL HIGH (ref 2.3–4.7)
Potassium: 5 mEq/L (ref 3.5–5.1)
Sodium: 140 mEq/L (ref 136–145)

## 2016-05-05 LAB — CBC
Absolute NRBC: 0 10*3/uL
Hematocrit: 24.8 % — ABNORMAL LOW (ref 42.0–52.0)
Hgb: 7.9 g/dL — ABNORMAL LOW (ref 13.0–17.0)
MCH: 30.5 pg (ref 28.0–32.0)
MCHC: 31.9 g/dL — ABNORMAL LOW (ref 32.0–36.0)
MCV: 95.8 fL (ref 80.0–100.0)
MPV: 9.6 fL (ref 9.4–12.3)
Nucleated RBC: 0 /100 WBC (ref 0.0–1.0)
Platelets: 153 10*3/uL (ref 140–400)
RBC: 2.59 10*6/uL — ABNORMAL LOW (ref 4.70–6.00)
RDW: 16 % — ABNORMAL HIGH (ref 12–15)
WBC: 6.16 10*3/uL (ref 3.50–10.80)

## 2016-05-05 LAB — GFR: EGFR: 8.6

## 2016-05-05 MED ORDER — CALCITRIOL 0.25 MCG PO CAPS
1.0000 ug | ORAL_CAPSULE | Freq: Every day | ORAL | Status: DC
Start: 2016-05-05 — End: 2016-05-05

## 2016-05-05 NOTE — Plan of Care (Signed)
Problem: Pain  Goal: Pain at adequate level as identified by patient  Outcome: Progressing   05/05/16 0981   Goal/Interventions addressed this shift   Pain at adequate level as identified by patient Identify patient comfort function goal;Assess for risk of opioid induced respiratory depression, including snoring/sleep apnea. Alert healthcare team of risk factors identified.;Assess pain on admission, during daily assessment and/or before any "as needed" intervention(s);Reassess pain within 30-60 minutes of any procedure/intervention, per Pain Assessment, Intervention, Reassessment (AIR) Cycle;Evaluate if patient comfort function goal is met;Evaluate patient's satisfaction with pain management progress;Offer non-pharmacological pain management interventions;Include patient/patient care companion in decisions related to pain management as needed   Pt c/o pain. Refuses tylenol. Norco given 1x with pt sleeping post medication. HD completed with pt removed early since pt refused further HD at that time. Iv abx given as ordered. Nad noted.

## 2016-05-05 NOTE — Plan of Care (Signed)
Patient received dialysis this morning and now wants to leave against medical advice.  He is refusing his BP medications even though his BP is high.  AMA form signed.  IV was removed from right shoulder.  Patient packed his belongings and ambulated off the unit.

## 2016-05-05 NOTE — Discharge Summary (Signed)
[facility]      Patient: [name]  Admission Date: 05/03/2016   DOB: 04-07-1971  Discharge Date: 05/05/2016    MRN: [id number]  Discharge Attending: Norton Pastel MD   Referring Physician: Christa See, MD  PCP: Christa See, MD       DISCHARGE SUMMARY     Discharge Information   Admission Diagnosis:   Abdominal pain, nausea, vomiting,  Diarrhea    Discharge Diagnosis:   Patient Active Problem List    Diagnosis Date Noted   . Colitis 05/04/2016   . Psychological factors affecting medical condition 02/04/2016   . ESRD needing dialysis 01/26/2016   . Clostridium difficile infection 01/26/2016   . Cardiomyopathy    . Cardiac LV ejection fraction 21-40% 01/07/2016   . Chest pain, unspecified type 01/06/2016   . Chest pain in adult 12/30/2015   . Acute on chronic congestive heart failure 12/30/2015   . Drug-seeking behavior 12/18/2015   . HAP (hospital-acquired pneumonia) 12/17/2015   . Diarrhea 12/17/2015   . Hospital-acquired pneumonia    . Nausea & vomiting 11/09/2015   . Decreased appetite 11/09/2015   . Ascites 11/09/2015   . Diarrhea of infectious origin 11/09/2015   . Acute pain due to trauma 11/17/2013   . Pulmonary contusion, initial encounter 11/17/2013   . 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 Hypertension 11/17/2013        Admission Condition: fair  Discharge Condition: fair  Functional Status: Patient is independent with mobility/ambulation, transfers, ADL's, IADL's.    Discharge Medications:     Medication List      ASK your doctor about these medications    acetaminophen 325 MG tablet  Commonly known as:  TYLENOL  Take 2 tablets (650 mg total) by mouth every 4 (four) hours as needed for Pain.     albuterol (2.5 MG/3ML) 0.083% nebulizer solution  Commonly known as:  PROVENTIL     albuterol-ipratropium 20-100 MCG/ACT Aers  Commonly known as:  COMBIVENT RESPIMAT     amLODIPine 10 MG tablet  Commonly known as:  NORVASC     carvedilol 25 MG tablet  Commonly  known as:  COREG     furosemide 80 MG tablet  Commonly known as:  LASIX     hydrALAZINE 100 MG tablet  Commonly known as:  APRESOLINE     sevelamer 800 MG tablet  Commonly known as:  [facility]                Hospital Course   Presentation History   46 y.o. male with a PMHx of ESRD on HD, CAD, CHF, HTN, asthma, tobacco dependence, medical noncompliance who presented with abdominal pain, nausea, vomiting, diarrhea since Friday. He is visiting his sister from [place] and started having vomiting and diarrhea along with abdominal pain since Friday. His symptoms persisted yesterday with fever 101F and 6 episodes of diarrhea that he presented to ED. He had a similar episode in October when he was diagnosed with C. diff colitis and treated with PO vancomycin at the time. He has been feeling well otherwise and denies any recent use of antibiotics. He is on MWF HD and his last HD was on last Wed. He reports to be compliant with HD and medications at home. He reports increased swelling in his legs and abdomen. He denies chest pain, SOB, or cough. Of note, there has been a history of noncompliance with hemodialysis, drug seeking behavior, and hospital  hopping per record. He has been discharged from [facility] and will need a new dialysis center upon discharge.     See HPI for details.    Hospital Course (1 Days)   1.  Abdominal pain, nausea, vomiting, diarrhea  CT abdomen with mild colitis could be recurrent C. difficile colitis  Patient was on oral vancomycin  Stool workup was still pending  Patient was tolerating oral diet once he was here in the hospital  Though he continued to complain of pain    2.  Uncontrolled hypertension  Noncompliant with his medications  He was declining.  His oral medications in hospital intermittently as well    3.  ESRD on hemodialysis  Complain of pain during dialysis to PermCath and stopped yesterday and also today for 2  Hour  Patient declined to go for further dialysis If  IV Dilaudid not given.    4.  Significant history of noncompliance and also opiates seeking behavior with multiple hospital hopping  Patient was counseled for compliance with the medications and dialysis  Patient was also told there is no indication of intravenous opiates for his present condition.    5.  Disposition  Despite multiple attempts to convince patient for medical plus hemodialysis compliance and need for inpt complete w/u for his diarrhea  Patient decided to leave AGAINST MEDICAL ADVICE.  This is an unfortunate situation .  This patient has multiple significant medical comorbidities, but his noncompliance with medications and advices is a big barrier for him to be out of hospital or be in stable health as outpatient.    Procedures/Imaging:   CT Abd/ Pelvis without Contrast   Final Result       1.  Possible mild wall thickening at the rectosigmoid colon could   represent colitis or be related to underdistention.   2.  Large volume ascites.   3.  Small bilateral pleural effusions and trace pericardial effusion.   4.  Bilateral perihilar groundglass opacities at the lung bases,   possibly pulmonary edema.   5.  Anasarca.   6.  Atrophic kidneys.      Demetrios Isaacs, MD    05/04/2016 1:48 AM      XR Chest  AP Portable   Final Result          Bibasilar atelectasis and small bilateral pleural effusions.      Demetrios Isaacs, MD    05/04/2016 1:03 AM          Treatment Team:   Consulting Physician: Ron Parker, MD  Consulting Physician: Vianne Bulls, MD  Consulting Physician: Sherian Maroon, MD  Consulting Physician: Darcel Bayley, MD       Best Practices   Was the patient admitted with either a CHF Exacerbation or Pneumonia? No     Progress Note/Physical Exam at Discharge     Subjective:   Pain and dialysis catheter site requesting IV pain medication before dialysis  Also, abdominal pain  Tolerated.  Diet while in the hospital  Had 3 episodes of loose stool overnight  No fever documented    Vitals:     05/05/16 1200 05/05/16 1230 05/05/16 1245 05/05/16 1250   BP: 170/88 (!) 183/114 (!) 172/115 (!) 170/112   Pulse: 88 89 88 80   Resp:   18 18   Temp:   98 F (36.7 C)    TempSrc:       SpO2:   97%  Weight:       Height:         Not in distress,   General: NAD, AAOx3  HEENT: perrla, eomi, sclera anicteric, OP: Clear, MMM  Neck: supple, FROM, no LAD  Cardiovascular: RRR, no m/r/g  Lungs: CTAB, no w/r/r  Abdomen: soft, +BS, no g/r, ??tenderness on palpation, none with distraction  Extremities: no C/C/E  Skin: no rashes or lesions noted  Perm catheter site without any signs of infection  Neuro: CN 2-12 intact; No Focal neurological deficits       Diagnostics     Labs/Studies Pending at Discharge: No    Last Labs     Recent Labs  Lab 05/05/16  1103 05/03/16  2351   WBC 6.16 8.81   RBC 2.59* 2.88*   Hgb 7.9* 8.7*   Hematocrit 24.8* 27.4*   MCV 95.8 95.1   Platelets 153 161         Recent Labs  Lab 05/05/16  1103 05/03/16  2351   Sodium 140 139   Potassium 5.0 5.9*   Chloride 103 103   CO2 25 22   BUN 37* 51*   Creatinine 8.2* 9.6*   Glucose 137* 83   Calcium 6.9* 7.1*   Magnesium  --  1.9       Microbiology Results     None           Patient Instructions   Patient signed out AGAINST MEDICAL ADVICE  Need to follow up with his PCP and continue dialysis as outpatient       Time spent examining patient, discussing with patient/family regarding hospital course, chart review, reconciling medications and discharge planning: 40 minutes.    SignedNorton Pastel, MD  4:37 PM 05/05/2016

## 2016-05-05 NOTE — Progress Notes (Addendum)
HD CARE COORDINATOR -  Writer spoke with Raynelle Fanning, clinic mgr at [facility], she states pt has had hosp admissions at [facility], [facility] in [place], [place], [facility] within past 3 months. Pt will not be accepted at any [facility] clinics in [place] area. Writer reached out to [facility] [place] where pt expressed his desire to go. Writer spoke with Alvis Lemmings, Production designer, theatre/television/film at [facility], she said [facility] will not accept patient - they've been through this before with pt. ,Pt was turned down at [facility], [facility] [place] and [facility] [place].  Writer reached out to Dr. Arizona Constable, [facility], waiting on call back. E-mail sent to hosp CM Director, Nicole Kindred, with cc: to hosp CC, Nicholos Johns.     Gypsy Decant, HD Care Coordinator  [phone]

## 2016-05-05 NOTE — Progress Notes (Signed)
[facility] [employer].  Progress Note    Assessment:    - ESRD on HD MWF, non compliant, [name]  - Hx of non compliance/nonadherence  - Missed HD on Monday  - A/W diarrhea/vomiting  - Recent Hx of infected LT chest TDC as per patient, completed course of vanc?     Plan:    -HD today; he stopped his treatment after only 2:15 min; UF 2.5L  -ESA  -supportive care   -labs for outpatient placement     Burnis Medin, MD  Office - [phone]  ++++++++++++++++++++++++++++++++++++++++++++++++++++++++++++++  Subjective:  No new complaints    Medications:  Scheduled Meds:  Current Facility-Administered Medications   Medication Dose Route Frequency   . acetaminophen  1,000 mg Oral Once   . amLODIPine  10 mg Oral Daily   . carvedilol  25 mg Oral Q12H SCH   . famotidine  20 mg Oral Daily   . furosemide  80 mg Oral Daily   . heparin (porcine)  5,000 Units Subcutaneous Q8H SCH   . hydrALAZINE  100 mg Oral Q8H SCH   . metroNIDAZOLE  500 mg Intravenous Q8H SCH   . sevelamer  800 mg Oral TID MEALS   . vancomycin  250 mg Oral 4 times per day     Continuous Infusions:  PRN Meds:acetaminophen, albuterol, HYDROcodone-acetaminophen, naloxone, ondansetron **OR** ondansetron, promethazine    Objective:  Vital signs in last 24 hours:  Temp:  [96.4 F (35.8 C)-98.9 F (37.2 C)] 98 F (36.7 C)  Heart Rate:  [80-99] 80  Resp Rate:  [18-20] 18  BP: (158-206)/(88-118) 170/112  Intake/Output last 24 hours:    Intake/Output Summary (Last 24 hours) at 05/05/16 1305  Last data filed at 05/05/16 1250   Gross per 24 hour   Intake              341 ml   Output             4891 ml   Net            -4550 ml     Intake/Output this shift:  I/O this shift:  In: -   Out: 2000 [Other:2000]    Physical Exam:   Gen: WD WN NAD   CV: S1 S2 N RRR   Chest: CTAB   Ab: ND NT soft no HSM +BS   Ext: No C/E    Labs:    Recent Labs  Lab 05/05/16  1103 05/03/16  2351   Glucose 137* 83   BUN 37* 51*   Creatinine 8.2* 9.6*    Calcium 6.9* 7.1*   Sodium 140 139   Potassium 5.0 5.9*   Chloride 103 103   CO2 25 22   Albumin 2.3* 2.6*   Phosphorus 5.8*  --    Magnesium  --  1.9       Recent Labs  Lab 05/05/16  1103 05/03/16  2351   WBC 6.16 8.81   Hgb 7.9* 8.7*   Hematocrit 24.8* 27.4*   MCV 95.8 95.1   MCH 30.5 30.2   MCHC 31.9* 31.8*   RDW 16* 16*   MPV 9.6 10.0   Platelets 153 161

## 2016-05-05 NOTE — Procedures (Signed)
Pt off machine early. Ran for 2 hrs 15 mins today with uf off net 2.0 liters. Writer tried to convince him to stay on machine longer but he refused. Dr. Arizona Constable here to see him, ok to take him off, he tried to convince pt about stay on dialysis but pt stated he's not feeling well. Next dialysis tx is Friday. Dialysis catheter dressing dry and intact, catheter with tegos and white curos in place. bp very high, pt stable, report given to [place], bedside nurse.

## 2016-05-05 NOTE — Progress Notes (Signed)
05/05/16 1425   Discharge Disposition   Physical Discharge Disposition AMA     [name] left AMA prior to CM assessment.  Updated CM Director and HD Care Coordinator.

## 2016-05-10 NOTE — Progress Notes (Signed)
Case closed for proper documentation. No othe rtransplant notes found in Epic.

## 2016-05-15 ENCOUNTER — Inpatient Hospital Stay: Admit: 2016-05-15 | Payer: MEDICARE

## 2016-05-15 ENCOUNTER — Emergency Department: Admit: 2016-05-15 | Payer: MEDICARE

## 2016-05-15 ENCOUNTER — Inpatient Hospital Stay
Admit: 2016-05-15 | Discharge: 2016-05-17 | Discharge status: Against Medical Advice | Payer: MEDICARE | Attending: Internal Medicine | Admitting: Internal Medicine

## 2016-05-15 ENCOUNTER — Inpatient Hospital Stay

## 2016-05-15 DIAGNOSIS — R109 Unspecified abdominal pain: Secondary | ICD-10-CM

## 2016-05-15 LAB — CBC WITH AUTOMATED DIFF
ABS. BASOPHILS: 0.1 10*3/uL (ref 0.0–0.1)
ABS. EOSINOPHILS: 0.5 10*3/uL — ABNORMAL HIGH (ref 0.0–0.4)
ABS. IMM. GRANS.: 0 10*3/uL (ref 0.00–0.04)
ABS. LYMPHOCYTES: 0.5 10*3/uL — ABNORMAL LOW (ref 0.8–3.5)
ABS. MONOCYTES: 0.5 10*3/uL (ref 0.0–1.0)
ABS. NEUTROPHILS: 4.3 10*3/uL (ref 1.8–8.0)
ABSOLUTE NRBC: 0 10*3/uL (ref 0.00–0.01)
BASOPHILS: 1 % (ref 0–1)
EOSINOPHILS: 9 % — ABNORMAL HIGH (ref 0–7)
HCT: 27.2 % — ABNORMAL LOW (ref 36.6–50.3)
HGB: 8.6 g/dL — ABNORMAL LOW (ref 12.1–17.0)
IMMATURE GRANULOCYTES: 0 % (ref 0.0–0.5)
LYMPHOCYTES: 9 % — ABNORMAL LOW (ref 12–49)
MCH: 30.5 PG (ref 26.0–34.0)
MCHC: 31.6 g/dL (ref 30.0–36.5)
MCV: 96.5 FL (ref 80.0–99.0)
MONOCYTES: 8 % (ref 5–13)
MPV: 10.7 FL (ref 8.9–12.9)
NEUTROPHILS: 73 % (ref 32–75)
NRBC: 0 PER 100 WBC
PLATELET: 133 10*3/uL — ABNORMAL LOW (ref 150–400)
RBC: 2.82 M/uL — ABNORMAL LOW (ref 4.10–5.70)
RDW: 16.5 % — ABNORMAL HIGH (ref 11.5–14.5)
WBC: 5.9 10*3/uL (ref 4.1–11.1)

## 2016-05-15 LAB — METABOLIC PANEL, COMPREHENSIVE
A-G Ratio: 0.8 — ABNORMAL LOW (ref 1.1–2.2)
ALT (SGPT): 13 U/L (ref 12–78)
AST (SGOT): 24 U/L (ref 15–37)
Albumin: 3 g/dL — ABNORMAL LOW (ref 3.5–5.0)
Alk. phosphatase: 292 U/L — ABNORMAL HIGH (ref 45–117)
Anion gap: 10 mmol/L (ref 5–15)
BUN/Creatinine ratio: 6 — ABNORMAL LOW (ref 12–20)
BUN: 42 MG/DL — ABNORMAL HIGH (ref 6–20)
Bilirubin, total: 0.5 MG/DL (ref 0.2–1.0)
CO2: 27 mmol/L (ref 21–32)
Calcium: 8.1 MG/DL — ABNORMAL LOW (ref 8.5–10.1)
Chloride: 100 mmol/L (ref 97–108)
Creatinine: 7.54 MG/DL — ABNORMAL HIGH (ref 0.70–1.30)
GFR est AA: 10 mL/min/{1.73_m2} — ABNORMAL LOW (ref >60)
GFR est non-AA: 8 mL/min/{1.73_m2} — ABNORMAL LOW (ref >60)
Globulin: 3.9 g/dL (ref 2.0–4.0)
Glucose: 84 mg/dL (ref 65–100)
Potassium: 5.6 mmol/L — ABNORMAL HIGH (ref 3.5–5.1)
Protein, total: 6.9 g/dL (ref 6.4–8.2)
Sodium: 137 mmol/L (ref 136–145)

## 2016-05-15 LAB — PHOSPHORUS: Phosphorus: 4.5 MG/DL (ref 2.6–4.7)

## 2016-05-15 LAB — CELL COUNT, BODY FLUID
FLD LYMPHS: 14 % — AB
FLD MONO/MACROPHAGES: 1 % — AB
FLD NEUTROPHILS: 2 % — AB
FLUID MESOTHELIAL: 83 % — AB
FLUID NUCLEATED CELLS: 113 /mm3
FLUID RBC CT.: >100 /mm3 — ABNORMAL HIGH

## 2016-05-15 LAB — MAGNESIUM: Magnesium: 2.4 mg/dL (ref 1.6–2.4)

## 2016-05-15 LAB — PROTHROMBIN TIME + INR
INR: 1.1 (ref 0.9–1.1)
Prothrombin time: 11.2 s — ABNORMAL HIGH (ref 9.0–11.1)

## 2016-05-15 LAB — LACTIC ACID: Lactic acid: 1.1 MMOL/L (ref 0.4–2.0)

## 2016-05-15 LAB — LIPASE: Lipase: 189 U/L (ref 73–393)

## 2016-05-15 MED ORDER — FUROSEMIDE 80 MG TAB
80 mg | Freq: Every day | ORAL | Status: DC
Start: 2016-05-15 — End: 2016-05-17
  Administered 2016-05-16 – 2016-05-17 (×2): via ORAL

## 2016-05-15 MED ORDER — DEXTROSE 50% IN WATER (D50W) IV SYRG
INTRAVENOUS | Status: DC | PRN
Start: 2016-05-15 — End: 2016-05-17

## 2016-05-15 MED ORDER — LABETALOL 5 MG/ML IV SOLN
5 mg/mL | Freq: Four times a day (QID) | INTRAVENOUS | Status: DC | PRN
Start: 2016-05-15 — End: 2016-05-17
  Administered 2016-05-15 – 2016-05-16 (×2): via INTRAVENOUS

## 2016-05-15 MED ORDER — AMLODIPINE 5 MG TAB
5 mg | Freq: Every day | ORAL | Status: DC
Start: 2016-05-15 — End: 2016-05-17
  Administered 2016-05-16 – 2016-05-17 (×2): via ORAL

## 2016-05-15 MED ORDER — ONDANSETRON (PF) 4 MG/2 ML INJECTION
4 mg/2 mL | INTRAMUSCULAR | Status: DC | PRN
Start: 2016-05-15 — End: 2016-05-17
  Administered 2016-05-15 – 2016-05-16 (×4): via INTRAVENOUS

## 2016-05-15 MED ORDER — INSULIN REGULAR HUMAN 100 UNIT/ML INJECTION
100 unit/mL | Freq: Once | INTRAMUSCULAR | Status: AC
Start: 2016-05-15 — End: 2016-05-15
  Administered 2016-05-15: 21:00:00 via INTRAVENOUS

## 2016-05-15 MED ORDER — SODIUM CHLORIDE 0.9 % IJ SYRG
Freq: Three times a day (TID) | INTRAMUSCULAR | Status: DC
Start: 2016-05-15 — End: 2016-05-17
  Administered 2016-05-15 – 2016-05-17 (×6): via INTRAVENOUS

## 2016-05-15 MED ORDER — ONDANSETRON (PF) 4 MG/2 ML INJECTION
4 mg/2 mL | INTRAMUSCULAR | Status: AC
Start: 2016-05-15 — End: 2016-05-15
  Administered 2016-05-15: 20:00:00 via INTRAVENOUS

## 2016-05-15 MED ORDER — ONDANSETRON (PF) 4 MG/2 ML INJECTION
4 mg/2 mL | INTRAMUSCULAR | Status: AC
Start: 2016-05-15 — End: 2016-05-15
  Administered 2016-05-15: 19:00:00 via INTRAVENOUS

## 2016-05-15 MED ORDER — GLUCAGON 1 MG INJECTION
1 mg | INTRAMUSCULAR | Status: DC | PRN
Start: 2016-05-15 — End: 2016-05-17

## 2016-05-15 MED ORDER — IPRATROPIUM-ALBUTEROL 2.5 MG-0.5 MG/3 ML NEB SOLUTION
2.5 mg-0.5 mg/3 ml | Freq: Four times a day (QID) | RESPIRATORY_TRACT | Status: DC | PRN
Start: 2016-05-15 — End: 2016-05-17

## 2016-05-15 MED ORDER — INSULIN LISPRO 100 UNIT/ML INJECTION
100 unit/mL | Freq: Four times a day (QID) | SUBCUTANEOUS | Status: DC
Start: 2016-05-15 — End: 2016-05-17
  Administered 2016-05-17: 03:00:00 via SUBCUTANEOUS

## 2016-05-15 MED ORDER — CARVEDILOL 12.5 MG TAB
12.5 mg | Freq: Two times a day (BID) | ORAL | Status: DC
Start: 2016-05-15 — End: 2016-05-17
  Administered 2016-05-16 – 2016-05-17 (×2): via ORAL

## 2016-05-15 MED ORDER — HEPARIN (PORCINE) 5,000 UNIT/ML IJ SOLN
5000 unit/mL | Freq: Two times a day (BID) | INTRAMUSCULAR | Status: DC
Start: 2016-05-15 — End: 2016-05-17

## 2016-05-15 MED ORDER — L.ACIDOPH & PARACASEI-S.THERMOPHIL-BIFIDOBACTERIUM 8 BILLION CELL CAP
8 billion cell | Freq: Every day | ORAL | Status: DC
Start: 2016-05-15 — End: 2016-05-17
  Administered 2016-05-16 – 2016-05-17 (×2): via ORAL

## 2016-05-15 MED ORDER — OXYCODONE 5 MG TAB
5 mg | ORAL | Status: DC | PRN
Start: 2016-05-15 — End: 2016-05-17

## 2016-05-15 MED ORDER — SODIUM CHLORIDE 0.9 % IJ SYRG
INTRAMUSCULAR | Status: DC | PRN
Start: 2016-05-15 — End: 2016-05-17
  Administered 2016-05-16 (×3): via INTRAVENOUS

## 2016-05-15 MED ORDER — HEPARIN (PORCINE) 5,000 UNIT/ML IJ SOLN
5000 unit/mL | Freq: Three times a day (TID) | INTRAMUSCULAR | Status: DC
Start: 2016-05-15 — End: 2016-05-15

## 2016-05-15 MED ORDER — GLUCOSE 4 GRAM CHEWABLE TAB
4 gram | ORAL | Status: DC | PRN
Start: 2016-05-15 — End: 2016-05-17

## 2016-05-15 MED ORDER — ACETAMINOPHEN 325 MG TABLET
325 mg | ORAL | Status: DC | PRN
Start: 2016-05-15 — End: 2016-05-17

## 2016-05-15 MED ORDER — SODIUM CHLORIDE 0.9% BOLUS IV
0.9 % | Freq: Once | INTRAVENOUS | Status: AC
Start: 2016-05-15 — End: 2016-05-15
  Administered 2016-05-15: 19:00:00 via INTRAVENOUS

## 2016-05-15 MED ORDER — VANCOMYCIN ORAL SOLUTION 50 MG/ML CPD (RX COMPOUNDED)
50 mg/mL | Freq: Four times a day (QID) | ORAL | Status: DC
Start: 2016-05-15 — End: 2016-05-16
  Administered 2016-05-16 (×3): via ORAL

## 2016-05-15 MED ORDER — FENTANYL CITRATE (PF) 50 MCG/ML IJ SOLN
50 mcg/mL | INTRAMUSCULAR | Status: AC
Start: 2016-05-15 — End: 2016-05-15
  Administered 2016-05-15: 19:00:00 via INTRAVENOUS

## 2016-05-15 MED ORDER — EPOETIN ALFA 4,000 UNIT/ML IJ SOLN
4000 unit/mL | INTRAMUSCULAR | Status: DC
Start: 2016-05-15 — End: 2016-05-17

## 2016-05-15 MED ORDER — HYDROMORPHONE (PF) 2 MG/ML IJ SOLN
2 mg/mL | INTRAMUSCULAR | Status: AC
Start: 2016-05-15 — End: 2016-05-15
  Administered 2016-05-15: 20:00:00 via INTRAVENOUS

## 2016-05-15 MED ORDER — DEXTROSE 50% IN WATER (D50W) IV SYRG
Freq: Once | INTRAVENOUS | Status: AC
Start: 2016-05-15 — End: 2016-05-15
  Administered 2016-05-15: 22:00:00 via INTRAVENOUS

## 2016-05-15 MED ORDER — ADV ADDAPTOR
1 gram | Status: DC
Start: 2016-05-15 — End: 2016-05-16
  Administered 2016-05-16: 03:00:00 via INTRAVENOUS

## 2016-05-15 MED ORDER — SEVELAMER CARBONATE 800 MG TAB
800 mg | Freq: Three times a day (TID) | ORAL | Status: DC
Start: 2016-05-15 — End: 2016-05-17
  Administered 2016-05-16: 17:00:00 via ORAL

## 2016-05-15 MED ORDER — HYDROMORPHONE 2 MG/ML INJECTION SOLUTION
2 mg/mL | INTRAMUSCULAR | Status: DC | PRN
Start: 2016-05-15 — End: 2016-05-16
  Administered 2016-05-15 – 2016-05-16 (×6): via INTRAVENOUS

## 2016-05-15 MED ORDER — HYDRALAZINE 50 MG TAB
50 mg | Freq: Three times a day (TID) | ORAL | Status: DC
Start: 2016-05-15 — End: 2016-05-17
  Administered 2016-05-16 – 2016-05-17 (×4): via ORAL

## 2016-05-15 MED FILL — HYDROMORPHONE (PF) 2 MG/ML IJ SOLN: 2 mg/mL | INTRAMUSCULAR | Qty: 1

## 2016-05-15 MED FILL — OXYCODONE 5 MG TAB: 5 mg | ORAL | Qty: 1

## 2016-05-15 MED FILL — LABETALOL 5 MG/ML IV SOLN: 5 mg/mL | INTRAVENOUS | Qty: 20

## 2016-05-15 MED FILL — BD POSIFLUSH NORMAL SALINE 0.9 % INJECTION SYRINGE: INTRAMUSCULAR | Qty: 10

## 2016-05-15 MED FILL — FENTANYL CITRATE (PF) 50 MCG/ML IJ SOLN: 50 mcg/mL | INTRAMUSCULAR | Qty: 2

## 2016-05-15 MED FILL — VANCOMYCIN ORAL SOLUTION 50 MG/ML CPD (RX COMPOUNDED): 50 mg/mL | ORAL | Qty: 5

## 2016-05-15 MED FILL — HYDRALAZINE 50 MG TAB: 50 mg | ORAL | Qty: 2

## 2016-05-15 MED FILL — SEVELAMER CARBONATE 800 MG TAB: 800 mg | ORAL | Qty: 1

## 2016-05-15 MED FILL — DEXTROSE 50% IN WATER (D50W) IV SYRG: INTRAVENOUS | Qty: 50

## 2016-05-15 MED FILL — INSULIN REGULAR HUMAN 100 UNIT/ML INJECTION: 100 unit/mL | INTRAMUSCULAR | Qty: 1

## 2016-05-15 MED FILL — HYDROMORPHONE 2 MG/ML INJECTION SOLUTION: 2 mg/mL | INTRAMUSCULAR | Qty: 1

## 2016-05-15 MED FILL — ONDANSETRON (PF) 4 MG/2 ML INJECTION: 4 mg/2 mL | INTRAMUSCULAR | Qty: 2

## 2016-05-15 NOTE — ED Notes (Signed)
TRANSFER - OUT REPORT:    Verbal report given to Trish RN (name) on David [name]  being transferred to oncology (unit) for routine progression of care       Report consisted of patient???s Situation, Background, Assessment and   Recommendations(SBAR).     Information from the following report(s) SBAR, Kardex, ED Summary and MAR was reviewed with the receiving nurse.    Lines:   Peripheral IV 05/15/16 Right Other(comment) (Active)   Site Assessment Clean, dry, & intact 05/15/2016  1:10 PM   Phlebitis Assessment 0 05/15/2016  1:10 PM   Infiltration Assessment 0 05/15/2016  1:10 PM   Dressing Status Clean, dry, & intact 05/15/2016  1:10 PM        Opportunity for questions and clarification was provided.      Patient transported with:  Pt belongings, chart, labels.    Will medicate with what medication we have in the pyxis or tube station prior to transferring.   Will medicate with pain medication.  Will re-draw hemolyzed PT/INR.

## 2016-05-15 NOTE — Progress Notes (Addendum)
2151: Pt refused 2200 dose of Hydralazine until he finished his dialysis. Pt also refused finger stick blood sugar checked, stating that: "I don't have diabetes. It should be fine".  0040: Pt also refused 0000 dose of PO Vancomycin and wanted to wait until dialysis is finished.

## 2016-05-15 NOTE — Progress Notes (Signed)
Bedside and Verbal shift change report given to [name] (oncoming nurse) by Mickey FarberMarci L McCormick, RN   (offgoing nurse). Report included the following information SBAR, Kardex and Recent Results.    Zone Phone for oncoming shift:   [phone]    Shift Summary: new admit from ER. Refusing all po meds    LDAs               Peripheral IV 05/15/16 Right Other(comment) (Active)   Site Assessment Clean, dry, & intact 05/15/2016  1:10 PM   Phlebitis Assessment 0 05/15/2016  1:10 PM   Infiltration Assessment 0 05/15/2016  1:10 PM   Dressing Status Clean, dry, & intact 05/15/2016  1:10 PM                        Intake & Output     Last Bowel Movement Last Bowel Movement Date: 05/15/16   Glucose Checks []  N/A  [x]  AC/HS  []  Q6  Concerns:   Nutrition Active Orders   Diet    DIET CLEAR LIQUID    DIET NPO       Consults [] PT  [] OT  [] Speech  [] Case Management   Cardiac Monitoring [] N/A [x] Yes Expires:

## 2016-05-15 NOTE — ED Notes (Signed)
MD at bedside

## 2016-05-15 NOTE — ED Notes (Signed)
MD Wendie SimmerSequeira at bedside for paracentesis.

## 2016-05-15 NOTE — ED Notes (Signed)
Lab hemolyzed. PT/INR needs to be repeated.

## 2016-05-15 NOTE — ED Notes (Signed)
Pt refused insulin and d50.  I educated pt on this.   Pt continued to refuse.    This nurse will notify Mencias.

## 2016-05-15 NOTE — ED Notes (Signed)
Hospitalist at bedside.  Pt refusing all PO medications at time and still refusing Dextrose and insulin.

## 2016-05-15 NOTE — Other (Signed)
[facility] [place]  [phone]    Vitals   Pre   Post   Assessment   Pre   Post     Temp  98.3  98.6 LOC  AXOX3 AXOX3   HR   95 90 Lungs   Clear,    Unchanged   B/P   172/116 209/126 Cardiac   NSR  NSR   Resp   18 17 Skin   W/D, scars  Unchanged   Pain level  Denies Abd pain Edema  Generalized +1     Generalized traces   Orders:    Duration:   Start:    2255 End:    0155 Total:   3 hours   Dialyzer:   Dialyzer/Set Up Inspection: Revaclear (05/15/16 2300)   K Bath:   Dialysate K (mEq/L): 2 (05/15/16 2300)   Ca Bath:   Dialysate CA (mEq/L): 2.5 (05/15/16 2300)   Na/Bicarb:   Dialysate NA (mEq/L): 135 (05/15/16 2300)   Target Fluid Removal:   Goal/Amount of Fluid to Remove (mL): 3000 mL (05/15/16 2300)   Access     Type & Location:   L subcl  tunneled CVC. Ports of permcath disinfected with Alcohol per policy.  Each lumen aspirated for blood return and flushed with Normal Saline per policy.  Dialysis initiated.Dsg changed aseptically today 05/15/16. biopatch is visible.       Labs  Drawn post HD.   Obtained/Reviewed   Critical Results Called   Date when labs were drawn-  Hgb-    HGB   Date Value Ref Range Status   05/15/2016 8.6 (L) 12.1 - 17.0 g/dL Final     K-    Potassium   Date Value Ref Range Status   05/15/2016 5.6 (H) 3.5 - 5.1 mmol/L Final     Ca-   Calcium   Date Value Ref Range Status   05/15/2016 8.1 (L) 8.5 - 10.1 MG/DL Final     Bun-   BUN   Date Value Ref Range Status   05/15/2016 42 (H) 6 - 20 MG/DL Final     Creat-   Creatinine   Date Value Ref Range Status   05/15/2016 7.54 (H) 0.70 - 1.30 MG/DL Final        Medications/ Blood Products Given     Name   Dose   Route and Time                     Blood Volume Processed (BVP):    66.9 Net Fluid   Removed:  3000 ml   Comments   Time Out Done: Yes  Primary Nurse Rpt UJW:JXBJYPre:[name] Pawlak, RN  Primary Nurse Rpt Post:D.Pawlak, RN  Pt Education:tx compliance,BP control  Care Plan:HD per nephrologists.   Tx Summary:Pt tolerated tx well, asymptomatically hypertensive, refused BP meds several times but requested tx to end 30 min early for no apparent reason,stating: "I'm not here because of dialysis, I' m here because of my stomach".EOT all possible blood returned with no incident. Central line catheter flushed with normal saline per policy.  Ports disinfected with Alcohol per policy and lines disconnected and capped using aseptic technique.     Admiting Diagnosis:  Pt's previous clinic-[name]  Consent signed - Informed Consent Verified: Yes (obtained 05/15/16) (05/15/16 2300)  [facility] Consent - Obtained 05/15/16  Hepatitis Status- Unknown  Machine #- Machine Number: [id number] (05/15/16 2300)  Telemetry status-Remote telemetry  Pre-dialysis wt.- Pre-Dialysis Weight: 105.7 kg (  233 lb 0.4 oz) (05/15/16 2300)  Post dialysis wt: 102.2 kg

## 2016-05-15 NOTE — ED Notes (Signed)
Case d/w MD regarding pain medication not effective. Pain of ABD is 10/10

## 2016-05-15 NOTE — ED Notes (Signed)
Hospitalist at bedside.

## 2016-05-15 NOTE — Progress Notes (Signed)
Patient refusing medication and interventions, explained the reason for them, and that is the reason for the admission if he is not willing to get the help that we are offering he can be discharged, need to find out what were the reasons why he does not want to go back to his Renal care in [name].  If he is not willing to comply with medical care offered is going to be very difficult to help this gentleman.  Dr. Glee ArvinMencias  Hospitalist

## 2016-05-15 NOTE — Progress Notes (Signed)
Called Nephrology, Dr. Cordie GriceKeightley has already been assigned.

## 2016-05-15 NOTE — Progress Notes (Signed)
Pt refusing all po medications. States that he is nauseous. His blood pressure is very high and he is aware of this. I gave him some zofran for the nausea and asked if he would like to try his medications once the nausea wore off. He stated no. He is to have dialysis this evening.

## 2016-05-15 NOTE — ED Notes (Signed)
Pt back from imaging

## 2016-05-15 NOTE — ED Notes (Signed)
TRANSFER - OUT REPORT:    Verbal [facility]name[facility] on David [name]  being transferred to renal(unit) for routine progression of care       Report consisted of patient???s Situation, Background, Assessment and   Recommendations(SBAR).     Information from the following report(s) SBAR, Kardex, ED Summary and MAR was reviewed with the receiving nurse.    Lines:   Peripheral IV 05/15/16 Right Other(comment) (Active)   Site Assessment Clean, dry, & intact 05/15/2016  1:10 PM   Phlebitis Assessment 0 05/15/2016  1:10 PM   Infiltration Assessment 0 05/15/2016  1:10 PM   Dressing Status Clean, dry, & intact 05/15/2016  1:10 PM        Opportunity for questions and clarification was provided.      Patient transported with:   pt belongings (one bag and one bookbag) IV site in place. Chart and labels.    marcie is receiving nurse.

## 2016-05-15 NOTE — ED Notes (Signed)
Awaiting diuladid verification.    Pt on bedside commode for BM.

## 2016-05-15 NOTE — Progress Notes (Signed)
Primary Nurse [name] S Pawlak, RN and Marietah, RN performed a dual skin assessment on this patient No impairment noted  Braden score is 21

## 2016-05-15 NOTE — ED Provider Notes (Signed)
EMERGENCY DEPARTMENT HISTORY AND PHYSICAL EXAM      Date: 05/15/2016  Patient Name: David [name]    History of Presenting Illness     Chief Complaint   Patient presents with   ??? Abdominal Pain     The patient presents to the Emergency Department with complaints of abdominal pain with diarrhea, nausea and vomiting.    ??? Diarrhea   ??? Vomiting       History Provided By: Patient    HPI: David [name], 46 y.o. male with PMHx significant for CKD, HTN, asthma, ascites, presents ambulatory to the ED with cc of persistent diarrhea x 5 days. He reports associated symptoms of a central abdominal pain radiating to his back, nausea, vomiting, SOB, and a fever of 101F this AM. Pt has had C. Diff before in December, which did not require any surgical intervention. He notes he was recently placed on an antibiotic, to which he had finished x 2 weeks ago. He is also a dialysis pt from [place], [place] with his last dialysis on 1/22 (Monday) and has access via a port-a-cath. He notes he was due for dialysis on 1/22, but was "kicked out." He notes he has had about 3-4L of fluid drawn before and has had a paracentesis once. He denies any infection being seen in the fluid at the time. He denies the sensation of being fluid overloaded. Pt denies blood in his stool.    PCP: None    There are no other complaints, changes, or physical findings at this time.    Current Outpatient Prescriptions   Medication Sig Dispense Refill   ??? sevelamer (RENAGEL) 400 mg tablet Take 800 mg by mouth three (3) times daily (with meals).     ??? carvedilol (COREG) 12.5 mg tablet Take  by mouth two (2) times daily (with meals).     ??? hydrALAZINE (APRESOLINE) 100 mg tablet Take 100 mg by mouth three (3) times daily.     ??? amLODIPine (NORVASC) 10 mg tablet Take 10 mg by mouth daily.     ??? pantoprazole (PROTONIX) 40 mg tablet Take 40 mg by mouth daily.     ??? furosemide (LASIX) 80 mg tablet Take 80 mg by mouth daily.      ??? albuterol (PROVENTIL HFA, VENTOLIN HFA, PROAIR HFA) 90 mcg/actuation inhaler Take 2 Puffs by inhalation every four (4) hours as needed for Wheezing.         Past History     Past Medical History:  Past Medical History:   Diagnosis Date   ??? Adverse effect of anesthesia     sleep apnea uses oxygen at night    ??? Ascites    ??? Asthma    ??? C. difficile colitis    ??? CKD (chronic kidney disease) stage V requiring chronic dialysis ([place])    ??? HTN (hypertension)    ??? SBP (spontaneous bacterial peritonitis) [facility])        Past Surgical History:  Past Surgical History:   Procedure Laterality Date   ??? COLONOSCOPY N/A 03/04/2016    COLONOSCOPY performed by Macie Burows, MD at [facility]   ??? HX HERNIA REPAIR     ??? SIGMOIDOSCOPY,DIAGNOSTIC  03/04/2016            Family History:  Family History   Problem Relation Age of Onset   ??? Hypertension Mother        Social History:  Social History   Substance Use Topics   ???  Smoking status: Heavy Tobacco Smoker     Packs/day: 0.50     Years: 10.00   ??? Smokeless tobacco: Never Used   ??? Alcohol use No       Allergies:  Allergies   Allergen Reactions   ??? Lisinopril Shortness of Breath   ??? Morphine Shortness of Breath   ??? Toradol [Ketorolac] Shortness of Breath       Review of Systems   Review of Systems   Constitutional: Positive for fever. Negative for appetite change, chills and fatigue.   HENT: Negative.  Negative for congestion, rhinorrhea, sinus pressure and sore throat.    Eyes: Negative.    Respiratory: Positive for shortness of breath. Negative for cough, choking, chest tightness and wheezing.    Cardiovascular: Negative.  Negative for chest pain, palpitations and leg swelling.   Gastrointestinal: Positive for abdominal pain, diarrhea and nausea. Negative for blood in stool, constipation and vomiting.   Endocrine: Negative.    Genitourinary: Negative.  Negative for difficulty urinating, dysuria, flank pain and urgency.   Musculoskeletal: Positive for back pain.   Skin: Negative.     Neurological: Negative.  Negative for dizziness, speech difficulty, weakness, light-headedness, numbness and headaches.   Psychiatric/Behavioral: Negative.    All other systems reviewed and are negative.      Physical Exam   Physical Exam   Constitutional: He is oriented to person, place, and time. He appears well-developed and well-nourished. No distress.   HENT:   Head: Normocephalic and atraumatic.   Mouth/Throat: Oropharynx is clear and moist. No oropharyngeal exudate.   Eyes: Conjunctivae and EOM are normal. Pupils are equal, round, and reactive to light.   Neck: Normal range of motion. Neck supple. No JVD present. No tracheal deviation present.   Cardiovascular: Normal rate, regular rhythm, normal heart sounds and intact distal pulses.    No murmur heard.  Pulmonary/Chest: Effort normal and breath sounds normal. No stridor. No respiratory distress. He has no wheezes. He has no rales. He exhibits no tenderness.   Dialysis cath present   Abdominal: Soft. He exhibits no distension. There is tenderness (diffuse, no rebound). There is no guarding.   Multiple surgical scars   Musculoskeletal: Normal range of motion. He exhibits no edema or tenderness.   Neurological: He is alert and oriented to person, place, and time. No cranial nerve deficit.   No focal motor or sensory deficits    Skin: Skin is warm and dry. He is not diaphoretic.   Psychiatric: He has a normal mood and affect. His behavior is normal.   Nursing note and vitals reviewed.      Diagnostic Study Results     Labs -  Recent Results (from the past 12 hour(s))   CBC WITH AUTOMATED DIFF    Collection Time: 05/15/16  1:11 PM   Result Value Ref Range    WBC 5.9 4.1 - 11.1 K/uL    RBC 2.82 (L) 4.10 - 5.70 M/uL    HGB 8.6 (L) 12.1 - 17.0 g/dL    HCT 27.2 (L) 36.6 - 50.3 %    MCV 96.5 80.0 - 99.0 FL    MCH 30.5 26.0 - 34.0 PG    MCHC 31.6 30.0 - 36.5 g/dL    RDW 16.5 (H) 11.5 - 14.5 %    PLATELET 133 (L) 150 - 400 K/uL    MPV 10.7 8.9 - 12.9 FL     NRBC 0.0 0 PER 100 WBC    ABSOLUTE NRBC  0.00 0.00 - 0.01 K/uL    NEUTROPHILS 73 32 - 75 %    LYMPHOCYTES 9 (L) 12 - 49 %    MONOCYTES 8 5 - 13 %    EOSINOPHILS 9 (H) 0 - 7 %    BASOPHILS 1 0 - 1 %    IMMATURE GRANULOCYTES 0 0.0 - 0.5 %    ABS. NEUTROPHILS 4.3 1.8 - 8.0 K/UL    ABS. LYMPHOCYTES 0.5 (L) 0.8 - 3.5 K/UL    ABS. MONOCYTES 0.5 0.0 - 1.0 K/UL    ABS. EOSINOPHILS 0.5 (H) 0.0 - 0.4 K/UL    ABS. BASOPHILS 0.1 0.0 - 0.1 K/UL    ABS. IMM. GRANS. 0.0 0.00 - 0.04 K/UL    DF AUTOMATED      RBC COMMENTS ANISOCYTOSIS  1+        RBC COMMENTS POLYCHROMASIA  PRESENT       LIPASE    Collection Time: 05/15/16  1:11 PM   Result Value Ref Range    Lipase 189 73 - 393 U/L   MAGNESIUM    Collection Time: 05/15/16  1:11 PM   Result Value Ref Range    Magnesium 2.4 1.6 - 2.4 mg/dL   PHOSPHORUS    Collection Time: 05/15/16  1:11 PM   Result Value Ref Range    Phosphorus 4.5 2.6 - 4.7 MG/DL   LACTIC ACID    Collection Time: 05/15/16  1:11 PM   Result Value Ref Range    Lactic acid 1.1 0.4 - 2.0 MMOL/L   METABOLIC PANEL, COMPREHENSIVE    Collection Time: 05/15/16  1:11 PM   Result Value Ref Range    Sodium 137 136 - 145 mmol/L    Potassium 5.6 (H) 3.5 - 5.1 mmol/L    Chloride 100 97 - 108 mmol/L    CO2 27 21 - 32 mmol/L    Anion gap 10 5 - 15 mmol/L    Glucose 84 65 - 100 mg/dL    BUN 42 (H) 6 - 20 MG/DL    Creatinine 7.54 (H) 0.70 - 1.30 MG/DL    BUN/Creatinine ratio 6 (L) 12 - 20      GFR est AA 10 (L) >60 ml/min/1.79m    GFR est non-AA 8 (L) >60 ml/min/1.716m   Calcium 8.1 (L) 8.5 - 10.1 MG/DL    Bilirubin, total 0.5 0.2 - 1.0 MG/DL    ALT (SGPT) 13 12 - 78 U/L    AST (SGOT) 24 15 - 37 U/L    Alk. phosphatase 292 (H) 45 - 117 U/L    Protein, total 6.9 6.4 - 8.2 g/dL    Albumin 3.0 (L) 3.5 - 5.0 g/dL    Globulin 3.9 2.0 - 4.0 g/dL    A-G Ratio 0.8 (L) 1.1 - 2.2         Radiologic Studies -   CT Results  (Last 48 hours)               05/15/16 1351  CT ABD PELV WO CONT Final result    Impression:  IMPRESSION:        1. Increased large amount of ascites.   2. Interval decrease in an indeterminate mass associated with the left lower   quadrant abdominal wall.   3. Unchanged mild left pleural effusion and mild anasarca.           Narrative:  EXAM:  CT ABD PELV WO CONT  INDICATION: Abdominal pain       COMPARISON: 02/29/2016       CONTRAST:  None.       TECHNIQUE:    Thin axial images were obtained through the abdomen and pelvis. Coronal and   sagittal reconstructions were generated. Oral contrast was not administered. CT   dose reduction was achieved through use of a standardized protocol tailored for   this examination and automatic exposure control for dose modulation.        The absence of intravenous contrast material reduces the sensitivity for   evaluation of the solid parenchymal organs of the abdomen.        FINDINGS:    LUNG BASES: There is an unchanged small left pleural effusion.   INCIDENTALLY IMAGED HEART AND MEDIASTINUM: The heart is mildly enlarged. A   catheter is incompletely seen in the right atrium.   LIVER: No mass or biliary dilatation.   GALLBLADDER: Unremarkable.   SPLEEN: Mildly enlarged, but unchanged.   PANCREAS: No mass or ductal dilatation.   ADRENALS: Unremarkable.   KIDNEYS/URETERS: Severe atrophy of the bilateral kidneys. No hydronephrosis or   mass.   STOMACH: Unremarkable.   SMALL BOWEL: No dilatation or wall thickening.   COLON: No dilatation or wall thickening.   APPENDIX: Unremarkable.   PERITONEUM: There is a large amount of ascites that has increased in the   interval. There is a 2.6 x 2.9 cm mass along the left lower quadrant anterior   abdominal wall on series 2 image 60. There are small areas of peripheral   hyperdensity. This has decreased from 3.8 x 3.6 cm previously.   RETROPERITONEUM: No lymphadenopathy or aortic aneurysm.   REPRODUCTIVE ORGANS: The prostate and seminal vesicles are unremarkable.   URINARY BLADDER: The bladder is collapsed.    BONES: No destructive bone lesion. 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 There is a prominent Schmorl's node in the   inferior endplate of L2.   ADDITIONAL COMMENTS: There is diffuse mild anasarca with skin thickening of the   pannus.                 Medical Decision Making   I am the first provider for this patient.    I reviewed the vital signs, available nursing notes, past medical history, past surgical history, family history and social history.    Vital Signs-Reviewed the patient's vital signs.  Patient Vitals for the past 12 hrs:   Temp Pulse Resp BP SpO2   05/15/16 1457 - - - - 98 %   05/15/16 1445 - - - (!) 171/107 -   05/15/16 1430 - - - 159/87 -   05/15/16 1415 - - - (!) 159/97 -   05/15/16 1400 - - - (!) 162/105 -   05/15/16 1358 - - - (!) 151/109 -   05/15/16 1315 - - - (!) 153/91 -   05/15/16 1300 - - - (!) 177/109 -   05/15/16 1245 - - - 154/86 -   05/15/16 1230 - - - (!) 168/116 100 %   05/15/16 1226 - - - (!) 170/111 -   05/15/16 1200 - - - (!) 176/112 97 %   05/15/16 1145 - - - (!) 169/119 97 %   05/15/16 1141 - - - (!) 175/104 -   05/15/16 1124 98.8 ??F (37.1 ??C) (!) 103 18 (!) 204/120 95 %     Records Reviewed: Nursing Notes and Old  Medical Records    Provider Notes (Medical Decision Making):   DDx: C. Diff, colitis, electrolyte abnormality    ED Course:   Initial assessment performed. The patients presenting problems have been discussed, and they are in agreement with the care plan formulated and outlined with them.  I have encouraged them to ask questions as they arise throughout their visit.    EKG- 4:08 PM  NSR, rate 93, normal axis/pr/qrs, no acute ST-T waves changes, Gearldine Bienenstock, DO    Pt long hx of medical non-compliance, pt also does have extensive hx including SBP and C Diff colitis, pt will need to be admited for further work-up and evaluation.   Gearldine Bienenstock, DO    CONSULT NOTE:   3:25 PM  Rada Hay, DO spoke with Dr. Marolyn Haller,    Specialty: Hospitalist  Discussed pt's hx, disposition, and available diagnostic and imaging results. Reviewed care plans. Consultant will evaluate pt for admission.  Written by Betsey Amen, ED Scribe, as dictated by Rada Hay, DO.    Procedure Note - Paracentesis  3:34 PM   Performed by: Vernell Morgans, MD    Immediately prior to the procedure, the patient was reevaluated and found suitable for the planned procedure and any planned medications.    Immediately prior to the procedure a time out was called to verify the correct patient, procedure, equipment, staff, and marking as appropriate.    Area was cleansed with Chlorprep and anesthetized with 65ms of Lidocaine 1% with epinephrine.  18 gauge needle was introduced into abdominal cavity and 134m of clear was removed.  Fluid specimen obtained and sent to lab.    ABD position: RLQ  Ultrasound guidance: yes  Estimated blood loss: none  The procedure took 1-15 minutes, and pt tolerated well.  Written by JaStandley DakinsED Scribe, as dictated by JoVernell MorgansMD    Disposition:  PLAN:  1. Admit    ADMIT NOTE:  3:29 PM  Patient is being admitted to the hospital by Dr. MeMarolyn Haller The results of their tests and reasons for their admission have been discussed with them and/or available family.  They convey agreement and understanding for the need to be admitted and for their admission diagnosis.  Consultation has been made with the inpatient physician specialist for hospitalization.    Diagnosis     Clinical Impression:   1. Diarrhea of presumed infectious origin    2. Other ascites    3. Acute hyperkalemia    4. ESRD (end stage renal disease) [place]   5. H/O noncompliance with medical treatment, presenting hazards to health        Attestations:    This note is prepared by CoBetsey Amenacting as Scribe for JoRada HayDOPiquaDO: The scribe's documentation has been prepared under my  direction and personally reviewed by me in its entirety. I confirm that the note above accurately reflects all work, treatment, procedures, and medical decision making performed by me.

## 2016-05-15 NOTE — H&P (Signed)
Hospitalist Admission NoteNAME: David [name]   DOB:  [birth year]   MRN:  [id number]     Date/Time:  05/15/2016 4:10 PM    Patient PCP: None  ________________________________________________________________________    My assessment of this patient's clinical condition and my plan of care is as follows.    Assessment / Plan:  Colitis: r/o C. Diff Colitis, had 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 Keep on isolation  Anasarca: had missed 2 HD treatment, will need HD today.  Ascites: is large as per CT, got paracentesis with small quantity of fluid yield, will send for GRAM, Culture and cytology if enough but will need a therapeutic tap, will ask IR for possible tap on Monday, will start CTX as prophylaxis for SBP in the mean time. Has a mass noted in CT will need to see cytology of fluid if that is negative due to recurrent ascites will need a biopsy.  ESRD on HD: last HD on Monday 01/22, missed 2 already, will ask Nephrology evaluation for HD.  Hyperkalemia: will give D50/Insulin and monitor, no peaked T waves in EKG.  HTN: c/w Norvasc, Coreg, hydralazine, use labetalol prn.  Asthma: not wheezing at this time, use Duonebs prn.  Code Status: Full Code  Surrogate Decision Maker: sister Aram Beecham [phone]  DVT Prophylaxis: heparin  GI Prophylaxis: not indicated  Baseline: fairly independent        Subjective:   CHIEF COMPLAINT: "My belly hurts and have diarrhea"    HISTORY OF PRESENT ILLNESS:     David [name] is a 46 y.o.  African American male  with PMHx significant for CKD, HTN, asthma, ascites, presents ambulatory to the ED with cc of persistent diarrhea x 5 days. He reports associated symptoms of a central abdominal pain radiating to his back, nausea, vomiting, SOB, and a fever of 101F this AM. Pt has had C. Diff before in December, which did not  require any surgical intervention. He notes he was recently placed on an antibiotic, to which he had finished x 2 weeks ago. He is also a dialysis pt from [place], [place] with his last dialysis on 1/22 (Monday) and has access via a port-a-cath. He notes he was due for dialysis on 1/22, but was "kicked out." He notes he has had about 3-4L of fluid drawn before and has had a paracentesis once. He denies any infection being seen in the fluid at the time. He denies the sensation of being fluid overloaded. Pt denies blood in his stool.  At this time patient is lying in bed c/o abdominal pain, N/V and diarrhea, has large ascites and low extremity edema, last HD had been on 01/22, denies chest pain, no SOB, no fever, no cough, no urinary symptoms, no other associated symptoms.  We were asked to admit for work up and evaluation of the above problems.     Past Medical History:   Diagnosis Date   ??? Adverse effect of anesthesia     sleep apnea uses oxygen at night    ??? Ascites    ??? Asthma    ??? C. difficile colitis    ??? CKD (chronic kidney disease) stage V requiring chronic dialysis ([place])    ??? HTN (hypertension)    ??? SBP (spontaneous [facility] peritonitis) [facility])         Past Surgical History:   Procedure Laterality Date   ???  COLONOSCOPY N/A 03/04/2016    COLONOSCOPY performed by Macie Burows, MD at [facility]   ??? HX HERNIA REPAIR     ??? SIGMOIDOSCOPY,DIAGNOSTIC  03/04/2016            Social History   Substance Use Topics   ??? Smoking status: Heavy Tobacco Smoker     Packs/day: 0.50     Years: 10.00   ??? Smokeless tobacco: Never Used   ??? Alcohol use No        Family History   Problem Relation Age of Onset   ??? Hypertension Mother      Allergies   Allergen Reactions   ??? Lisinopril Shortness of Breath   ??? Morphine Shortness of Breath   ??? Toradol [Ketorolac] Shortness of Breath        Prior to Admission medications    Medication Sig Start Date End Date Taking? Authorizing Provider    sevelamer (RENAGEL) 400 mg tablet Take 800 mg by mouth three (3) times daily (with meals).    Phys Other, MD   carvedilol (COREG) 12.5 mg tablet Take  by mouth two (2) times daily (with meals).    Phys Other, MD   hydrALAZINE (APRESOLINE) 100 mg tablet Take 100 mg by mouth three (3) times daily.    Phys Other, MD   amLODIPine (NORVASC) 10 mg tablet Take 10 mg by mouth daily.    Phys Other, MD   pantoprazole (PROTONIX) 40 mg tablet Take 40 mg by mouth daily.    Phys Other, MD   furosemide (LASIX) 80 mg tablet Take 80 mg by mouth daily.    Phys Other, MD   albuterol (PROVENTIL HFA, VENTOLIN HFA, PROAIR HFA) 90 mcg/actuation inhaler Take 2 Puffs by inhalation every four (4) hours as needed for Wheezing.    Phys Other, MD       REVIEW OF SYSTEMS:     I am not able to complete the review of systems because:   The patient is intubated and sedated    The patient has altered mental status due to his acute medical problems    The patient has baseline aphasia from prior stroke(s)    The patient has baseline dementia and is not reliable historian    The patient is in acute medical distress and unable to provide information           Total of 12 systems reviewed as follows:       POSITIVE= underlined text  Negative = text not underlined  General:  fever, chills, sweats, generalized weakness, weight loss/gain,      loss of appetite   Eyes:    blurred vision, eye pain, loss of vision, double vision  ENT:    rhinorrhea, pharyngitis   Respiratory:   cough, sputum production, SOB, DOE, wheezing, pleuritic pain   Cardiology:   chest pain, palpitations, orthopnea, PND, edema, syncope   Gastrointestinal:  abdominal pain , N/V, diarrhea, dysphagia, constipation, bleeding   Genitourinary:  frequency, urgency, dysuria, hematuria, incontinence   Muskuloskeletal :  arthralgia, myalgia, back pain  Hematology:  easy bruising, nose or gum bleeding, lymphadenopathy   Dermatological: rash, ulceration, pruritis, color change / jaundice   Endocrine:   hot flashes or polydipsia   Neurological:  headache, dizziness, confusion, focal weakness, paresthesia,     Speech difficulties, memory loss, gait difficulty  Psychological: Feelings of anxiety, depression, agitation    Objective:   VITALS:    Visit Vitals   ??? BP Marland Kitchen)  171/107   ??? Pulse (!) 103   ??? Temp 98.8 ??F (37.1 ??C)   ??? Resp 18   ??? Ht '6\' 1"'  (1.854 m)   ??? Wt 106 kg (233 lb 11 oz)   ??? SpO2 98%   ??? BMI 30.83 kg/m2       PHYSICAL EXAM:    General:    Alert, cooperative, in pain, appears stated age.     HEENT: Atraumatic, anicteric sclerae, pink conjunctivae     No oral ulcers, mucosa moist, throat clear, dentition poor  Neck:  Supple, symmetrical,  thyroid: non tender  Lungs:   Coarse BS, dull in bases, mild crackles in bases  Chest wall:  No tenderness  No Accessory muscle use.  Heart:   Regular  rhythm,  No  murmur   + edema  Abdomen:   Soft, diffuse tender. +  Distended, rebound?.  Bowel sounds normal  Extremities: No cyanosis.  No clubbing,      Skin turgor normal, Capillary refill normal, Radial pulse 2+  Skin:     Not pale.  Not Jaundiced  No rashes   Psych:  Good insight.  Not depressed.  Not anxious or agitated.  Neurologic: EOMs intact. No facial asymmetry. No aphasia or slurred speech. Symmetrical strength, Sensation grossly intact. Alert and oriented X 4.     _______________________________________________________________________  Care Plan discussed with:    Comments   Patient y    Family      RN y    Scientist, forensic:      _______________________________________________________________________  Expected  Disposition:   Home with Family y   HH/PT/OT/RN y   SNF/LTC    [facility]    ________________________________________________________________________  TOTAL TIME:  38 Minutes    Critical Care Provided     Minutes non procedure based      Comments    y Reviewed previous records   >50% of visit spent in counseling and coordination of care y Discussion  with patient and/or family and questions answered       ________________________________________________________________________  Signed: Amelia Jo, MD    Procedures: see electronic medical records for all procedures/Xrays and details which were not copied into this note but were reviewed prior to creation of Plan.    LAB DATA REVIEWED:    Recent Results (from the past 24 hour(s))   CBC WITH AUTOMATED DIFF    Collection Time: 05/15/16  1:11 PM   Result Value Ref Range    WBC 5.9 4.1 - 11.1 K/uL    RBC 2.82 (L) 4.10 - 5.70 M/uL    HGB 8.6 (L) 12.1 - 17.0 g/dL    HCT 27.2 (L) 36.6 - 50.3 %    MCV 96.5 80.0 - 99.0 FL    MCH 30.5 26.0 - 34.0 PG    MCHC 31.6 30.0 - 36.5 g/dL    RDW 16.5 (H) 11.5 - 14.5 %    PLATELET 133 (L) 150 - 400 K/uL    MPV 10.7 8.9 - 12.9 FL    NRBC 0.0 0 PER 100 WBC    ABSOLUTE NRBC 0.00 0.00 - 0.01 K/uL    NEUTROPHILS 73 32 - 75 %    LYMPHOCYTES 9 (L) 12 - 49 %    MONOCYTES 8 5 - 13 %    EOSINOPHILS 9 (H) 0 - 7 %  BASOPHILS 1 0 - 1 %    IMMATURE GRANULOCYTES 0 0.0 - 0.5 %    ABS. NEUTROPHILS 4.3 1.8 - 8.0 K/UL    ABS. LYMPHOCYTES 0.5 (L) 0.8 - 3.5 K/UL    ABS. MONOCYTES 0.5 0.0 - 1.0 K/UL    ABS. EOSINOPHILS 0.5 (H) 0.0 - 0.4 K/UL    ABS. BASOPHILS 0.1 0.0 - 0.1 K/UL    ABS. IMM. GRANS. 0.0 0.00 - 0.04 K/UL    DF AUTOMATED      RBC COMMENTS ANISOCYTOSIS  1+        RBC COMMENTS POLYCHROMASIA  PRESENT       LIPASE    Collection Time: 05/15/16  1:11 PM   Result Value Ref Range    Lipase 189 73 - 393 U/L   MAGNESIUM    Collection Time: 05/15/16  1:11 PM   Result Value Ref Range    Magnesium 2.4 1.6 - 2.4 mg/dL   PHOSPHORUS    Collection Time: 05/15/16  1:11 PM   Result Value Ref Range    Phosphorus 4.5 2.6 - 4.7 MG/DL   LACTIC ACID    Collection Time: 05/15/16  1:11 PM   Result Value Ref Range    Lactic acid 1.1 0.4 - 2.0 MMOL/L   METABOLIC PANEL, COMPREHENSIVE    Collection Time: 05/15/16  1:11 PM   Result Value Ref Range    Sodium 137 136 - 145 mmol/L     Potassium 5.6 (H) 3.5 - 5.1 mmol/L    Chloride 100 97 - 108 mmol/L    CO2 27 21 - 32 mmol/L    Anion gap 10 5 - 15 mmol/L    Glucose 84 65 - 100 mg/dL    BUN 42 (H) 6 - 20 MG/DL    Creatinine 7.54 (H) 0.70 - 1.30 MG/DL    BUN/Creatinine ratio 6 (L) 12 - 20      GFR est AA 10 (L) >60 ml/min/1.71m    GFR est non-AA 8 (L) >60 ml/min/1.719m   Calcium 8.1 (L) 8.5 - 10.1 MG/DL    Bilirubin, total 0.5 0.2 - 1.0 MG/DL    ALT (SGPT) 13 12 - 78 U/L    AST (SGOT) 24 15 - 37 U/L    Alk. phosphatase 292 (H) 45 - 117 U/L    Protein, total 6.9 6.4 - 8.2 g/dL    Albumin 3.0 (L) 3.5 - 5.0 g/dL    Globulin 3.9 2.0 - 4.0 g/dL    A-G Ratio 0.8 (L) 1.1 - 2.2     EKG, 12 LEAD, INITIAL    Collection Time: 05/15/16  4:06 PM   Result Value Ref Range    Ventricular Rate 93 BPM    Atrial Rate 93 BPM    P-R Interval 184 ms    QRS Duration 84 ms    Q-T Interval 406 ms    QTC Calculation (Bezet) 504 ms    Calculated P Axis 54 degrees    Calculated R Axis -16 degrees    Calculated T Axis 68 degrees    Diagnosis       Normal sinus rhythm  Possible Left atrial enlargement  Prolonged QT  Abnormal ECG  When compared with ECG of 08-Mar-2016 07:21,  No significant change was found

## 2016-05-15 NOTE — ED Notes (Signed)
Pt reports pain is a 9/10," It has not come down much due to the move from imaging and back to room" per pt.

## 2016-05-15 NOTE — Consults (Signed)
/     Consultation Note    NAME: David [name]   DOB:  05-21-1970   MRN:  [id number]     Date/Time:  05/15/2016 6:22 PM    I have been asked to see this patient by Dr. Glee Arvin  for advice/opinion re: ESRD.     Assessment :    Plan:  ESRD-discharged from [name] unit  Anemia  N/V/diarrhea  Noncompliance Will try to get him dialyzed tonight for mildly elevated potassium.  We will treat acutely and are not accepting him on a chronic basis.    Start EPO       Subjective:   CHIEF COMPLAINT:  "I feel bd."    HISTORY OF PRESENT ILLNESS:     David [name] is a 46 y.o.   male who has a history of ESRD last dialyzing in [name] MWF.  He was last there Monday.  He says that he was discharged due to "noncompliance."  He says that over the past several days he has had N/V/diarrhea.  He says that he has had c. Diff in the past.  He also c/o fever to 101.  Poor po intake over the past several days.    Past Medical History:   Diagnosis Date   ??? Adverse effect of anesthesia     sleep apnea uses oxygen at night    ??? Ascites    ??? Asthma    ??? C. difficile colitis    ??? CKD (chronic kidney disease) stage V requiring chronic dialysis ([facility])    ??? HTN (hypertension)    ??? SBP (spontaneous bacterial peritonitis[facility])       Past Surgical History:   Procedure Laterality Date   ??? COLONOSCOPY N/A 03/04/2016    COLONOSCOPY performed by Sandy Salaam, MD at [facility]   ??? HX HERNIA REPAIR     ??? SIGMOIDOSCOPY,DIAGNOSTIC  03/04/2016          Social History   Substance Use Topics   ??? Smoking status: Heavy Tobacco Smoker     Packs/day: 0.50     Years: 10.00   ??? Smokeless tobacco: Never Used   ??? Alcohol use No      Family History   Problem Relation Age of Onset   ??? Hypertension Mother       Allergies   Allergen Reactions   ??? Lisinopril Shortness of Breath   ??? Morphine Shortness of Breath   ??? Toradol [Ketorolac] Shortness of Breath      Prior to Admission medications    Medication Sig Start Date End Date Taking? Authorizing Provider    sevelamer (RENAGEL) 400 mg tablet Take 800 mg by mouth three (3) times daily (with meals).    Phys Other, MD   carvedilol (COREG) 12.5 mg tablet Take  by mouth two (2) times daily (with meals).    Phys Other, MD   hydrALAZINE (APRESOLINE) 100 mg tablet Take 100 mg by mouth three (3) times daily.    Phys Other, MD   amLODIPine (NORVASC) 10 mg tablet Take 10 mg by mouth daily.    Phys Other, MD   pantoprazole (PROTONIX) 40 mg tablet Take 40 mg by mouth daily.    Phys Other, MD   furosemide (LASIX) 80 mg tablet Take 80 mg by mouth daily.    Phys Other, MD   albuterol (PROVENTIL HFA, VENTOLIN HFA, PROAIR HFA) 90 mcg/actuation inhaler Take 2 Puffs by inhalation every four (4) hours as needed for Wheezing.  Phys Other, MD     REVIEW OF SYSTEMS:     []   Unable to obtain reliable ROS due to  []  mental status  []  sedated   []  intubated   [x]  Total of 12 systems reviewed as follows:  Constitutional: negative fever, negative chills, negative weight loss  Eyes:   negative visual changes  ENT:   negative sore throat, tongue or lip swelling  Respiratory:  negative cough, negative dyspnea  Cards:  negative for chest pain, palpitations, lower extremity edema  GI:   pos for nausea, vomiting, diarrhea, and no abdominal pain  GU:  negative for frequency, dysuria  Integument:  negative for rash and pruritus  Heme:  negative for easy bruising and gum/nose bleeding  Musculoskel: negative for myalgias,  back pain and muscle weakness  Neuro:  negative for headaches, dizziness, vertigo  Psych:  negative for feelings of anxiety, depression   Travel?: none    Objective:   VITALS:    Visit Vitals   ??? BP (!) 173/110   ??? Pulse 86   ??? Temp 98.4 ??F (36.9 ??C)   ??? Resp 14   ??? Ht 6\' 1"  (1.854 m)   ??? Wt 106 kg (233 lb 11 oz)   ??? SpO2 97%   ??? BMI 30.83 kg/m2     PHYSICAL EXAM:  Gen:  [x]   WD [x]   WN  []  cachectic []   thin []   obese []   disheveled             []   ill apearing  []    Critical  []    Chronic    [x]   No acute distress     HEENT:   [x]  NC/AT/PERRL    [x]  pink conjunctivae      []  pale conjunctivae                  PERRL  []  yes  []  no      []  moist mucosa    []  dry mucosa    hearing intact to voice []  yes  []  No                 NECK:   supple [x]  yes  []  no        masses []  yes  [x]  No               thyroid  []   non tender  []   tender    RESP:   [x]  CTA bilaterally/no wheezing/rhonchi/rales/crackles    []  rhonchi bilaterally - no dullness  []  wheezing   []  rhonchi   []  crackles     use of accessory muscles []  yes []  no    CARD:   [x]   regular rate and rhythm/No murmurs/rubs/gallops    murmur  []  yes ()  []  no      Rubs  []  yes  []  no       Gallops []  yes  []  no    Rate []   regular  []   irregular        carotid bruits  []  Right  []   Left                 LE edema []  yes  [x]  no           JVP  []   yes   []   no    ABD:    [x]  soft/non distended/non tender/+bowel sounds/no HSM    []   Rigid  tenderness []  yes []  no   Liver enlargement  []   yes []   no                Spleen enlargement  []   yes []   no     distended []   yes []  no     bowel sound  []  hypoactive   []  hyperactive    LYMPH:    Neck []   yes [x]   no       Axillae []   yes [x]   no    SKIN:   Rashes []   yes   [x]   no    Ulcers []   yes   [x]   no               []  tight to palpitation    skin turgor []   good  []  poor  []  decreased               Cyanosis/clubbing []   yes []   no    NEUR:   [x]  cranial nerves II-XII grossly intact       []  Cranial nerves deficit                 []   facial droop    []   slurred speech   []  aphasic     []  Strength normal     []   weakness  []   LUE  []    RUE/ []   LLE  []    RLE    follows commands  [x]   yes []   no           PSYCH:   insight []  poor [x]  good   Alert and Oriented to  [x]  person  [x]  place  [x]   time                    []  depressed []  anxious []  agitated  []  lethargic []  stuporous  []  sedated     LAB DATA REVIEWED:    Recent Labs      05/15/16   1311   WBC  5.9   HGB  8.6*   HCT  27.2*   PLT  133*     Recent Labs      05/15/16   1311   NA  137    K  5.6*   CL  100   CO2  27   BUN  42*   CREA  7.54*   GLU  84   CA  8.1*   MG  2.4   PHOS  4.5     Recent Labs      05/15/16   1311   SGOT  24   ALT  13   AP  292*   TBILI  0.5   ALB  3.0*   GLOB  3.9   LPSE  189     Recent Labs      05/15/16   1705   INR  1.1   PTP  11.2*      No results for input(s): FE, TIBC, PSAT, FERR in the last 72 hours.   No results for input(s): PH, PCO2, PO2 in the last 72 hours.  No results for input(s): CPK, CKMB in the last 72 hours.    No lab exists for component: TROPONINI  Lab Results   Component Value Date/Time    Glucose (POC) 96 03/04/2016 02:04 PM    Glucose (POC) 105 02/29/2016 11:05 AM  Procedures: see electronic medical records for all procedures/Xrays and details which were not copied into this note but were reviewed prior to creation of Plan.    ________________________________________________________________________       ___________________________________________________  Consulting Physician: Rosalia Hammers, MD

## 2016-05-15 NOTE — ED Notes (Signed)
Pt continuing to refuse PO meds at time.  Pt agreed to IV dextrose and Insulin after education from this nurse and hospitalist.  IV labetalol given for HTN.    PO hydralizine pulled from pyxis but pt refused, wasted medication in room due to med out of pack and in contaminated room. (cdiff precautions)

## 2016-05-15 NOTE — ED Notes (Signed)
Pt sitting on bedside commode for sample.

## 2016-05-15 NOTE — ED Notes (Signed)
Pt reports SOB but associated with asthma.

## 2016-05-15 NOTE — ED Notes (Signed)
Enteric precautions in place.    Pt educated on provided stool specimen at next BM.

## 2016-05-15 NOTE — ED Notes (Signed)
Assumed care of pt from triage.    Pt in gown. Reports he has had loose stools since Monday 5 or 6 a day. Not able to keep anything down. Vomiting since Monday as well. Last vomiting episode at 10am. Last BM was 10 minutes ago.    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 Pt reports he had fistula removed in December and placed on antibiotic then, been off since end of December.    Denies being around anyone who has been sick.

## 2016-05-15 NOTE — ED Notes (Signed)
Labs drawn/ paracentesis labs sent off.  Pt resting in bed with gauze in place to needle insertion site, scant amt of blood noted on gauze.  Pt reprots pain back to 10/10.  Will medicate.

## 2016-05-15 NOTE — ED Notes (Addendum)
Next dialysis is Monday, but he does not have a clinic to go to, he was kicked out for lack of compliance. He has to find a new doctor.    Pt reports unable to keep any medication down since monday

## 2016-05-15 NOTE — Consults (Signed)
Consults by Ashley Royalty, MD at 05/15/16 Rickey Primus                Author: Ashley Royalty, MD  Service: Nephrology  Author Type: Physician       Filed: 05/15/16 1825  Date of Service: 05/15/16 1822  Status: Signed          Editor: Ashley Royalty, MD (Physician)            Consult Orders        1. IP CONSULT TO NEPHROLOGY [[id number]] ordered by Hazeline Junker, MD at 05/15/16 1535                              /            Consultation Note      NAME: David [name]    DOB:  1970-06-10    MRN:  [id number]       Date/Time:   05/15/2016 6:22 PM      I have been asked to see this patient by Dr. Glee Arvin  for advice/opinion re: ESRD.       Assessment :    Plan :      ESRD-discharged from [name] unit   Anemia   N/V/diarrhea   Noncompliance  Will try to get him dialyzed tonight for mildly elevated potassium.  We will treat acutely and are not accepting him on a chronic basis.      Start EPO             Subjective:     CHIEF COMPLAINT:  "I feel bd."      HISTORY OF PRESENT ILLNESS:      David [name] is a 46 y.o.    male who has a history of ESRD last dialyzing in [name] MWF.  He was last there Monday.  He says that he was discharged due to "noncompliance."   He says that over the past several days he has had N/V/diarrhea.  He says that he has had c. Diff in the past.  He also c/o fever to 101.  Poor po intake over the past several days.        Past Medical History:        Diagnosis  Date         ?  Adverse effect of anesthesia            sleep apnea uses oxygen at night          ?  Ascites       ?  Asthma       ?  C. difficile colitis       ?  CKD (chronic kidney disease) stage V requiring chronic dialysis (HCC)       ?  HTN (hypertension)           ?  SBP (spontaneous bacterial peritonitis) [facility])             Past Surgical History:         Procedure  Laterality  Date          ?  COLONOSCOPY  N/A  03/04/2016          COLONOSCOPY performed by Sandy Salaam, MD at [facility]          ?  HX  HERNIA REPAIR         ?  SIGMOIDOSCOPY,DIAGNOSTIC  03/04/2016                     Social History       Substance Use Topics         ?  Smoking status:  Heavy Tobacco Smoker              Packs/day:  0.50         Years:  10.00         ?  Smokeless tobacco:  Never Used         ?  Alcohol use  No           Family History         Problem  Relation  Age of Onset          ?  Hypertension  Mother             Allergies        Allergen  Reactions         ?  Lisinopril  Shortness of Breath     ?  Morphine  Shortness of Breath         ?  Toradol [Ketorolac]  Shortness of Breath           Prior to Admission medications             Medication  Sig  Start Date  End Date  Taking?  Authorizing Provider            sevelamer (RENAGEL) 400 mg tablet  Take 800 mg by mouth three (3) times daily (with meals).        Phys Other, MD     carvedilol (COREG) 12.5 mg tablet  Take  by mouth two (2) times daily (with meals).        Phys Other, MD     hydrALAZINE (APRESOLINE) 100 mg tablet  Take 100 mg by mouth three (3) times daily.        Phys Other, MD     amLODIPine (NORVASC) 10 mg tablet  Take 10 mg by mouth daily.        Phys Other, MD     pantoprazole (PROTONIX) 40 mg tablet  Take 40 mg by mouth daily.        Phys Other, MD     furosemide (LASIX) 80 mg tablet  Take 80 mg by mouth daily.        Phys Other, MD            albuterol (PROVENTIL HFA, VENTOLIN HFA, PROAIR HFA) 90 mcg/actuation inhaler  Take 2 Puffs by inhalation every four (4) hours as needed for Wheezing.        Phys Other, MD        REVIEW OF SYSTEMS:      []    Unable to obtain reliable ROS due to  []   mental status  []   sedated   []   intubated    [x]   Total of 12 systems reviewed as follows:   Constitutional: negative fever, negative chills, negative weight loss   Eyes:   negative visual changes   ENT:   negative sore throat, tongue or lip swelling   Respiratory:  negative cough, negative dyspnea   Cards:  negative for chest pain, palpitations, lower extremity edema   GI:    pos for nausea, vomiting, diarrhea, and no abdominal pain   GU:  negative for frequency, dysuria   Integument:  negative for  rash and pruritus   Heme:  negative for easy bruising and gum/nose bleeding   Musculoskel: negative for myalgias,  back pain and muscle weakness   Neuro:  negative for headaches, dizziness, vertigo   Psych:  negative for feelings of anxiety, depression    Travel?: none        Objective:     VITALS:       Visit Vitals         ?  BP  (!) 173/110     ?  Pulse  86     ?  Temp  98.4 ??F (36.9 ??C)     ?  Resp  14     ?  Ht  6\' 1"  (1.854 m)     ?  Wt  106 kg (233 lb 11 oz)     ?  SpO2  97%         ?  BMI  30.83 kg/m2        PHYSICAL EXAM:   Gen:  [x]   WD [x]   WN  []  cachectic []   thin []   obese []   disheveled              []   ill apearing  []    Critical  []     Chronic    [x]    No acute distress      HEENT:   [x]   NC/AT/PERRL     [x]   pink conjunctivae      []  pale conjunctivae                   PERRL  []   yes  []   no      []   moist mucosa    []   dry mucosa     hearing intact to voice []   yes  []   No                   NECK:   supple [x]   yes  []   no        masses []  yes  [x]  No                thyroid  []    non tender  []    tender      RESP:   [x]   CTA bilaterally/no wheezing/rhonchi/rales/crackles     []   rhonchi bilaterally - no dullness  []  wheezing   []  rhonchi   []  crackles      use of accessory muscles []   yes []   no      CARD:   [x]    regular rate and rhythm/No murmurs/rubs/gallops     murmur  []   yes ()  []   no      Rubs  []   yes  []   no       Gallops []   yes  []   no     Rate []    regular  []    irregular        carotid bruits  []  Right  []   Left                  LE edema []   yes  [x]   no           JVP  []   yes   []   no      ABD:    [x]   soft/non distended/non tender/+bowel sounds/no HSM     []    Rigid    tenderness []  yes []   no   Liver enlargement  []    yes []    no                 Spleen enlargement  []    yes []    no     distended []    yes []   no      bowel sound  []   hypoactive   []    hyperactive      LYMPH:    Neck []   yes [x]   no       Axillae []   yes [x]    no      SKIN:   Rashes []   yes   [x]   no    Ulcers []   yes   [x]    no                []   tight to palpitation    skin turgor []   good  []  poor  []  decreased                Cyanosis/clubbing []   yes []   no      NEUR:   [x]   cranial nerves II-XII grossly intact        []   Cranial nerves deficit                  []   facial droop    []    slurred speech   []   aphasic      []  Strength normal     []   weakness  []    LUE  []     RUE/ []    LLE  []     RLE     follows commands  [x]   yes []   no             PSYCH:   insight []  poor [x]  good   Alert and Oriented to  [x]   person  [x]   place  [x]    time                     []   depressed []   anxious []   agitated  []   lethargic []   stuporous  []   sedated       LAB DATA REVIEWED:       Recent Labs            05/15/16    1311     WBC   5.9     HGB   8.6*     HCT   27.2*        PLT   133*          Recent Labs            05/15/16    1311     NA   137     K   5.6*     CL   100     CO2   27     BUN   42*     CREA   7.54*     GLU   84     CA   8.1*     MG   2.4        PHOS   4.5          Recent Labs            05/15/16    1311     SGOT   24     ALT   13  AP   292*     TBILI   0.5     ALB   3.0*     GLOB   3.9        LPSE   189          Recent Labs            05/15/16    1705     INR   1.1        PTP   11.2*         No results for input(s): FE, TIBC, PSAT, FERR in the last 72 hours.    No results for input(s): PH, PCO2, PO2 in the last 72 hours.   No results for input(s): CPK, CKMB in the last 72 hours.      No lab exists for component: TROPONINI     Lab Results         Component  Value  Date/Time            Glucose (POC)  96  03/04/2016 02:04 PM            Glucose (POC)  105  02/29/2016 11:05 AM           Procedures: see electronic medical records for all procedures/Xrays and details which were not copied into this note but were reviewed prior to creation of Plan.      ________________________________________________________________________          ___________________________________________________   Consulting Physician: Ashley Royalty, MD

## 2016-05-16 LAB — PHOSPHORUS: Phosphorus: 2.6 MG/DL (ref 2.6–4.7)

## 2016-05-16 LAB — CBC WITH AUTOMATED DIFF
ABS. BASOPHILS: 0 10*3/uL (ref 0.0–0.1)
ABS. EOSINOPHILS: 0.6 10*3/uL — ABNORMAL HIGH (ref 0.0–0.4)
ABS. IMM. GRANS.: 0 10*3/uL (ref 0.00–0.04)
ABS. LYMPHOCYTES: 0.5 10*3/uL — ABNORMAL LOW (ref 0.8–3.5)
ABS. MONOCYTES: 0.5 10*3/uL (ref 0.0–1.0)
ABS. NEUTROPHILS: 3.6 10*3/uL (ref 1.8–8.0)
ABSOLUTE NRBC: 0 10*3/uL (ref 0.00–0.01)
BASOPHILS: 1 % (ref 0–1)
EOSINOPHILS: 11 % — ABNORMAL HIGH (ref 0–7)
HCT: 26.8 % — ABNORMAL LOW (ref 36.6–50.3)
HGB: 8.4 g/dL — ABNORMAL LOW (ref 12.1–17.0)
IMMATURE GRANULOCYTES: 0 % (ref 0.0–0.5)
LYMPHOCYTES: 9 % — ABNORMAL LOW (ref 12–49)
MCH: 30.2 PG (ref 26.0–34.0)
MCHC: 31.3 g/dL (ref 30.0–36.5)
MCV: 96.4 FL (ref 80.0–99.0)
MONOCYTES: 9 % (ref 5–13)
MPV: 10.5 FL (ref 8.9–12.9)
NEUTROPHILS: 70 % (ref 32–75)
NRBC: 0 PER 100 WBC
PLATELET: 123 10*3/uL — ABNORMAL LOW (ref 150–400)
RBC: 2.78 M/uL — ABNORMAL LOW (ref 4.10–5.70)
RDW: 16.1 % — ABNORMAL HIGH (ref 11.5–14.5)
WBC: 5.2 10*3/uL (ref 4.1–11.1)

## 2016-05-16 LAB — EKG, 12 LEAD, INITIAL
Atrial Rate: 93 {beats}/min
Calculated P Axis: 54 degrees
Calculated R Axis: -16 degrees
Calculated T Axis: 68 degrees
Diagnosis: NORMAL
P-R Interval: 184 ms
Q-T Interval: 406 ms
QRS Duration: 84 ms
QTC Calculation (Bezet): 504 ms
Ventricular Rate: 93 {beats}/min

## 2016-05-16 LAB — METABOLIC PANEL, COMPREHENSIVE
A-G Ratio: 0.8 — ABNORMAL LOW (ref 1.1–2.2)
ALT (SGPT): 13 U/L (ref 12–78)
AST (SGOT): 20 U/L (ref 15–37)
Albumin: 2.9 g/dL — ABNORMAL LOW (ref 3.5–5.0)
Alk. phosphatase: 273 U/L — ABNORMAL HIGH (ref 45–117)
Anion gap: 8 mmol/L (ref 5–15)
BUN/Creatinine ratio: 5 — ABNORMAL LOW (ref 12–20)
BUN: 21 MG/DL — ABNORMAL HIGH (ref 6–20)
Bilirubin, total: 0.5 MG/DL (ref 0.2–1.0)
CO2: 30 mmol/L (ref 21–32)
Calcium: 8 MG/DL — ABNORMAL LOW (ref 8.5–10.1)
Chloride: 103 mmol/L (ref 97–108)
Creatinine: 4.19 MG/DL — ABNORMAL HIGH (ref 0.70–1.30)
GFR est AA: 19 mL/min/{1.73_m2} — ABNORMAL LOW (ref >60)
GFR est non-AA: 15 mL/min/{1.73_m2} — ABNORMAL LOW (ref >60)
Globulin: 3.7 g/dL (ref 2.0–4.0)
Glucose: 95 mg/dL (ref 65–100)
Potassium: 3.5 mmol/L (ref 3.5–5.1)
Protein, total: 6.6 g/dL (ref 6.4–8.2)
Sodium: 141 mmol/L (ref 136–145)

## 2016-05-16 LAB — TSH 3RD GENERATION: TSH: 1.9 u[IU]/mL (ref 0.36–3.74)

## 2016-05-16 LAB — C. DIFFICILE (DNA): C. difficile (DNA): NEGATIVE

## 2016-05-16 LAB — MAGNESIUM: Magnesium: 2.2 mg/dL (ref 1.6–2.4)

## 2016-05-16 LAB — EKG 12-LEAD
Atrial Rate: 93 {beats}/min
Diagnosis: NORMAL
P Axis: 54 degrees
P-R Interval: 184 ms
Q-T Interval: 406 ms
QRS Duration: 84 ms
QTc Calculation (Bazett): 504 ms
R Axis: -16 degrees
T Axis: 68 degrees
Ventricular Rate: 93 {beats}/min

## 2016-05-16 MED ORDER — HYDROMORPHONE 2 MG/ML INJECTION SOLUTION
2 mg/mL | INTRAMUSCULAR | Status: DC | PRN
Start: 2016-05-16 — End: 2016-05-17
  Administered 2016-05-17 (×4): via INTRAVENOUS

## 2016-05-16 MED FILL — SEVELAMER CARBONATE 800 MG TAB: 800 mg | ORAL | Qty: 1

## 2016-05-16 MED FILL — HYDROMORPHONE 2 MG/ML INJECTION SOLUTION: 2 mg/mL | INTRAMUSCULAR | Qty: 1

## 2016-05-16 MED FILL — CARVEDILOL 12.5 MG TAB: 12.5 mg | ORAL | Qty: 1

## 2016-05-16 MED FILL — INSULIN LISPRO 100 UNIT/ML INJECTION: 100 unit/mL | SUBCUTANEOUS | Qty: 1

## 2016-05-16 MED FILL — LABETALOL 5 MG/ML IV SOLN: 5 mg/mL | INTRAVENOUS | Qty: 20

## 2016-05-16 MED FILL — CEFTRIAXONE 1 GRAM SOLUTION FOR INJECTION: 1 gram | INTRAMUSCULAR | Qty: 1

## 2016-05-16 MED FILL — HYDRALAZINE 50 MG TAB: 50 mg | ORAL | Qty: 2

## 2016-05-16 MED FILL — BD POSIFLUSH NORMAL SALINE 0.9 % INJECTION SYRINGE: INTRAMUSCULAR | Qty: 20

## 2016-05-16 MED FILL — FUROSEMIDE 80 MG TAB: 80 mg | ORAL | Qty: 1

## 2016-05-16 MED FILL — AMLODIPINE 5 MG TAB: 5 mg | ORAL | Qty: 2

## 2016-05-16 MED FILL — ONDANSETRON (PF) 4 MG/2 ML INJECTION: 4 mg/2 mL | INTRAMUSCULAR | Qty: 2

## 2016-05-16 MED FILL — VANCOMYCIN ORAL SOLUTION 50 MG/ML CPD (RX COMPOUNDED): 50 mg/mL | ORAL | Qty: 5

## 2016-05-16 MED FILL — SODIUM CHLORIDE 0.9 % IV PIGGY BACK: INTRAVENOUS | Qty: 50

## 2016-05-16 MED FILL — RISAQUAD 8 BILLION CELL CAPSULE: 8 billion cell | ORAL | Qty: 1

## 2016-05-16 MED FILL — HEPARIN (PORCINE) 5,000 UNIT/ML IJ SOLN: 5000 unit/mL | INTRAMUSCULAR | Qty: 1

## 2016-05-16 NOTE — Progress Notes (Signed)
NAME: David [name]        DOB:  [birth year]        MRN:  [id number]        Assessment :    Plan:  --ESRD-discharged from [name] unit-MWF  Anemia  N/V/diarrhea  Noncompliance --No acute need for HD today.  Plan HD in AM.    Will need to find an outpatient HD unit which will likely be a challenge.         Subjective:     Chief Complaint:  " I had dialysis yesterday."  No N/V.  No dyspnea.  Tired.      Review of Systems:    Symptom Y/N Comments  Symptom Y/N Comments   Fever/Chills    Chest Pain     Poor Appetite    Edema     Cough    Abdominal Pain     Sputum    Joint Pain     SOB/DOE    Pruritis/Rash     Nausea/vomit    Tolerating PT/OT     Diarrhea    Tolerating Diet     Constipation    Other       Could not obtain due to:      Objective:     VITALS:   Last 24hrs VS reviewed since prior progress note. Most recent are:  Visit Vitals   ??? BP (!) 169/108   ??? Pulse 89   ??? Temp 99.2 ??F (37.3 ??C)   ??? Resp 18   ??? Ht 6\' 1"  (1.854 m)   ??? Wt 106 kg (233 lb 11 oz)   ??? SpO2 97%   ??? BMI 30.83 kg/m2       Intake/Output Summary (Last 24 hours) at 05/16/16 1057  Last data filed at 05/16/16 0157   Gross per 24 hour   Intake              125 ml   Output             3000 ml   Net            -2875 ml      Telemetry Reviewed:     PHYSICAL EXAM:  General: NAD  CTA  abd soft  No edema      Lab Data Reviewed: (see below)    Medications Reviewed: (see below)    PMH/SH reviewed - no change compared to H&P  ________________________________________________________________________  Care Plan discussed with:  Patient     Family      RN     Care Manager                    Consultant:          Comments   >50% of visit spent in counseling and coordination of care       ________________________________________________________________________  Ashley RoyaltyGerald E Keightley III, MD     Procedures: see electronic medical records for all procedures/Xrays and details which   were not copied into this note but were reviewed prior to creation of Plan.      LABS:  Recent Labs      05/16/16   0201  05/15/16   1311   WBC  5.2  5.9   HGB  8.4*  8.6*   HCT  26.8*  27.2*   PLT  123*  133*     Recent Labs      05/16/16   0201  05/15/16  1311   NA  141  137   K  3.5  5.6*   CL  103  100   CO2  30  27   BUN  21*  42*   CREA  4.19*  7.54*   GLU  95  84   CA  8.0*  8.1*   MG  2.2  2.4   PHOS  2.6  4.5     Recent Labs      05/16/16   0201  05/15/16   1311   SGOT  20  24   AP  273*  292*   TP  6.6  6.9   ALB  2.9*  3.0*   GLOB  3.7  3.9   LPSE   --   189     Recent Labs      05/15/16   1705   INR  1.1   PTP  11.2*      No results for input(s): FE, TIBC, PSAT, FERR in the last 72 hours.   No results found for: FOL, RBCF   No results for input(s): PH, PCO2, PO2 in the last 72 hours.  No results for input(s): CPK, CKMB in the last 72 hours.    No lab exists for component: TROPONINI  No components found for: GLPOC  No results found for: COLOR, APPRN, SPGRU, REFSG, PHU, PROTU, GLUCU, KETU, BILU, UROU, NITU, LEUKU, GLUKE, EPSU, BACTU, WBCU, RBCU, CASTS, UCRY    MEDICATIONS:  Current Facility-Administered Medications   Medication Dose Route Frequency   ??? amLODIPine (NORVASC) tablet 10 mg  10 mg Oral DAILY   ??? carvedilol (COREG) tablet 12.5 mg  12.5 mg Oral BID WITH MEALS   ??? furosemide (LASIX) tablet 80 mg  80 mg Oral DAILY   ??? hydrALAZINE (APRESOLINE) tablet 100 mg  100 mg Oral TID   ??? sevelamer carbonate (RENVELA) tab 800 mg  800 mg Oral TID WITH MEALS   ??? vancomycin 50 mg/mL oral solution (compounded) 250 mg  250 mg Oral Q6H   ??? cefTRIAXone (ROCEPHIN) 1 g in 0.9% sodium chloride (MBP/ADV) 50 mL  1 g IntraVENous Q24H   ??? albuterol-ipratropium (DUO-NEB) 2.5 MG-0.5 MG/3 ML  3 mL Nebulization Q6H PRN   ??? acetaminophen (TYLENOL) tablet 650 mg  650 mg Oral Q4H PRN   ??? ondansetron (ZOFRAN) injection 4 mg  4 mg IntraVENous Q4H PRN   ??? HYDROmorphone (DILAUDID) injection 0.5 mg  0.5 mg IntraVENous Q4H PRN    ??? oxyCODONE IR (ROXICODONE) tablet 5 mg  5 mg Oral Q4H PRN   ??? heparin (porcine) injection 5,000 Units  5,000 Units SubCUTAneous Q12H   ??? labetalol (NORMODYNE;TRANDATE) injection 10 mg  10 mg IntraVENous Q6H PRN   ??? sodium chloride (NS) flush 5-10 mL  5-10 mL IntraVENous Q8H   ??? sodium chloride (NS) flush 5-10 mL  5-10 mL IntraVENous PRN   ??? insulin lispro (HUMALOG) injection   SubCUTAneous AC&HS   ??? glucose chewable tablet 16 g  4 Tab Oral PRN   ??? dextrose (D50W) injection syrg 12.5-25 g  12.5-25 g IntraVENous PRN   ??? glucagon (GLUCAGEN) injection 1 mg  1 mg IntraMUSCular PRN   ??? lactobac ac& pc-s.therm-b.anim (FLORA Q/RISAQUAD)  1 Cap Oral DAILY   ??? [START ON 05/17/2016] epoetin alfa (EPOGEN;PROCRIT) injection 8,000 Units  8,000 Units SubCUTAneous Q MON, WED & FRI

## 2016-05-16 NOTE — Progress Notes (Signed)
Pt is alert and oriented x4. Pt is up with no assistance, pt is wanting to discontinue his telemetry monitoring.  Dr. Skeet Latchiesla made aware and telemetry discontinued. Pt up ambulating in the room. Will continue to monitor.

## 2016-05-16 NOTE — Progress Notes (Signed)
Attending Hospitalist Attestation:    I have personally seen and examined the patient, reviewed pertinent labs and imaging, and discussed the plan of care with NP AustriaDominica Ko.  I reviewed and agree with the history, exam, assessment and plan as outlined in her note.    "I keep throwing up and cannot keep anything down"  Complaints of severe throwing up, not witnessed by staff  Diarrhea, stools only loose per NS  Severe abdominal pain radiating to his back, not well controlled with the IV pain meds    Patient Vitals for the past 24 hrs:   Temp Pulse Resp BP SpO2   05/16/16 1614 98.2 ??F (36.8 ??C) 92 19 (!) 149/93 97 %   05/16/16 1139 98.6 ??F (37 ??C) 92 20 (!) 164/102 97 %   05/16/16 0836 99.2 ??F (37.3 ??C) 89 18 (!) 169/108 97 %   05/16/16 0310 98.3 ??F (36.8 ??C) 92 18 (!) 173/112 95 %   05/16/16 0157 - - - (!) 189/124 -   05/16/16 0125 - 95 - (!) 201/107 -   05/16/16 0055 - 95 - (!) 192/114 -   05/16/16 0025 - 94 - (!) 175/108 -   05/15/16 2355 - 91 - (!) 162/107 -   05/15/16 2325 - 95 - (!) 192/116 -   05/15/16 2300 98.4 ??F (36.9 ??C) 98 16 (!) 178/116 -   05/15/16 2245 98.3 ??F (36.8 ??C) 95 18 (!) 172/116 -   05/15/16 1818 98.4 ??F (36.9 ??C) 86 14 (!) 173/110 97 %   05/15/16 1730 - - - (!) 158/93 -   05/15/16 1715 - - - (!) 169/109 -   05/15/16 1700 - - - (!) 174/111 99 %   05/15/16 1657 - - - - 97 %   05/15/16 1645 - - - (!) 179/112 98 %   05/15/16 1643 - - - - 99 %     No distress  Lungs Clear to ausculation bilaterally  Heart: RRR nl S1S2, no murmurs  Abdomen: soft, diffusely tender, ND, Positive BS, no rebound  Skin: No rashes  Neuro: Alert, oriented x 3, non focal motor exam      Assessment/plan:    Abdominal pain POA  Diarrhea POA with no colitis on CT scan  No SIRS criteria  CT with increased ascites otherwise no acute pathology  Paracentesis with no evidence of SBP  C diff negative  Stool culture negative so far  Pt reports intractable N/V, not witnessed by NS  Stop ceftriaxone and vancomycin   Will follow cul;tures  GI to see in Am  LVP in AM to see if it improves symptoms  IV dilaudid for now, if no pathology evident, change to PO in 24 hours    Anasarca POA : had missed 2 HD treatment, will need HD today.  ESRD on HD POA  Hyperkalemia improved  Needs HD in AM, fired from multiple centers in [place] area per report   Appreciate nephrology input    Essential HTN POA   c/w Norvasc, Coreg, hydralazine, use labetalol prn.    Asthma: not wheezing at this time, use Duonebs prn.    Code Status: Full Code    Surrogate Decision Maker: sister Damian Leavellrudy [phone]    DVT Prophylaxis: heparin    GI Prophylaxis: not indicated    Baseline: fairly independent    Additional time:    25   min rendering and coordinating care    Tawny AsalJr Rilei Kravitz P Ciesla, MD

## 2016-05-16 NOTE — Progress Notes (Addendum)
Hospitalist Progress Note  NAME: David [name]   DOB:  [birth year]   MRN:  [id number]       Interim Hospital Summary: 46 y.o. male whom presented on 05/15/2016 with      Assessment / Plan:  Colitis  - stool study for C. Diff pending. 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 Keep on isolation  - GI consult     Anasarca  ESRD on HD  - had missed 2 HD treatment (last HD 1/22) prior to admission  - nephrology following  - pt needs to locate outpatient HD unit; pt has not been very compliant   - elevated troponin noted it; not cardiac related it.    Ascites  - ABD/Pel CT: large ascites, indeterminate mass associated with the left lower  quadrant abdominal wall.   - paracentesis done 05/15/2016: GRAM, Culture and cytology result pending  - CTX as prophylaxis for SBP started per admitting physician    Hyperkalemia  - K 3.5    HTN  - c/w Norvasc, Coreg, hydralazine, use labetalol prn.    Asthma  - not wheezing at this time, use Duonebs prn.    Code Status: Full Code  Surrogate Decision Maker: sister Damian Leavellrudy [phone]  DVT Prophylaxis: heparin  GI Prophylaxis: not indicated  Baseline: fairly independent    Recommended Disposition: Home w/Family     Subjective:     Chief Complaint / Reason for Physician Visit  "I want some more pain medication otherwise, I don't need anything from you".  Discussed with RN events overnight.     Review of Systems:  Symptom Y/N Comments  Symptom Y/N Comments   Fever/Chills n   Chest Pain n    Poor Appetite    Edema     Cough    Abdominal Pain     Sputum    Joint Pain y    SOB/DOE n   Pruritis/Rash     Nausea/vomit n   Tolerating PT/OT     Diarrhea    Tolerating Diet     Constipation    Other       Could NOT obtain due to:      Objective:     VITALS:   Last 24hrs VS reviewed since prior progress note. Most recent are:  Patient Vitals for the past 24 hrs:   Temp Pulse Resp BP SpO2    05/16/16 1139 98.6 ??F (37 ??C) 92 20 (!) 164/102 97 %   05/16/16 0836 99.2 ??F (37.3 ??C) 89 18 (!) 169/108 97 %   05/16/16 0310 98.3 ??F (36.8 ??C) 92 18 (!) 173/112 95 %   05/16/16 0157 - - - (!) 189/124 -   05/16/16 0125 - 95 - (!) 201/107 -   05/16/16 0055 - 95 - (!) 192/114 -   05/16/16 0025 - 94 - (!) 175/108 -   05/15/16 2355 - 91 - (!) 162/107 -   05/15/16 2325 - 95 - (!) 192/116 -   05/15/16 2300 98.4 ??F (36.9 ??C) 98 16 (!) 178/116 -   05/15/16 2245 98.3 ??F (36.8 ??C) 95 18 (!) 172/116 -   05/15/16 1818 98.4 ??F (36.9 ??C) 86 14 (!) 173/110 97 %   05/15/16 1730 - - - (!) 158/93 -   05/15/16 1715 - - - (!) 169/109 -   05/15/16 1700 - - - (!) 174/111  99 %   05/15/16 1657 - - - - 97 %   05/15/16 1645 - - - (!) 179/112 98 %   05/15/16 1643 - - - - 99 %   05/15/16 1641 - - - - 94 %   05/15/16 1638 - - - - 98 %   05/15/16 1630 - - - (!) 191/118 98 %   05/15/16 1615 - - - (!) 164/103 99 %   05/15/16 1609 - - - - 96 %   05/15/16 1600 - - - (!) 170/106 98 %   05/15/16 1457 - - - - 98 %   05/15/16 1445 - - - (!) 171/107 -   05/15/16 1430 - - - 159/87 -   05/15/16 1415 - - - (!) 159/97 -   05/15/16 1400 - - - (!) 162/105 -   05/15/16 1358 - - - (!) 151/109 -   05/15/16 1315 - - - (!) 153/91 -   05/15/16 1300 - - - (!) 177/109 -       Intake/Output Summary (Last 24 hours) at 05/16/16 1254  Last data filed at 05/16/16 0157   Gross per 24 hour   Intake              125 ml   Output             3000 ml   Net            -2875 ml        PHYSICAL EXAM:  General: WD, WN. Alert, cooperative, no acute distress????  EENT:  EOMI. Anicteric sclerae. MMM  Resp:  Clear in apex with decreased breath sounds at bases.  No accessory muscle use  CV:  Regular  rhythm,?? No edema  GI:  Soft, distended, diffuse tenderness. ??+Bowel sounds  Neurologic:?? Alert and oriented X 3, normal speech,   Psych:???? Good insight.??Not anxious nor agitated  Skin:  No rashes.  No jaundice    Reviewed most current lab test results and cultures  YES   Reviewed most current radiology test results   YES  Review and summation of old records today    NO  Reviewed patient's current orders and MAR    YES  PMH/SH reviewed - no change compared to H&P  ________________________________________________________________________  Care Plan discussed with:    Comments   Patient y    [facility]                        Multidiciplinary team rounds were held today with case manager, nursing, pharmacist and Higher education careers adviser.  Patient's plan of care was discussed; medications were reviewed and discharge planning was addressed.     ________________________________________________________________________  Total NON critical care TIME:  30   Minutes    Total CRITICAL CARE TIME Spent:   Minutes non procedure based      Comments   >50% of visit spent in counseling and coordination of care     ________________________________________________________________________  Benita Stabile, NP     Procedures: see electronic medical records for all procedures/Xrays and details which were not copied into this note but were reviewed prior to creation of Plan.      LABS:  I reviewed today's most current labs and imaging studies.  Pertinent labs include:  Recent Labs      05/16/16   0201  05/15/16   1311   WBC  5.2  5.9   HGB  8.4*  8.6*   HCT  26.8*  27.2*   PLT  123*  133*     Recent Labs      05/16/16   0201  05/15/16   1705  05/15/16   1311   NA  141   --   137   K  3.5   --   5.6*   CL  103   --   100   CO2  30   --   27   GLU  95   --   84   BUN  21*   --   42*   CREA  4.19*   --   7.54*   CA  8.0*   --   8.1*   MG  2.2   --   2.4   PHOS  2.6   --   4.5   ALB  2.9*   --   3.0*   TBILI  0.5   --   0.5   SGOT  20   --   24   ALT  13   --   13   INR   --   1.1   --        Signed: )Merton Wadlow Shelbie Ammons, NP

## 2016-05-16 NOTE — Progress Notes (Signed)
Bedside and Verbal shift change report given to Enjoli (oncoming nurse) by [name] (offgoing nurse). Report included the following information SBAR, Kardex, Intake/Output, MAR, Recent Results and Med Rec Status.

## 2016-05-17 ENCOUNTER — Encounter

## 2016-05-17 LAB — WBC, STOOL: White blood cells, stool: 0 /HPF (ref 0–4)

## 2016-05-17 LAB — CBC W/O DIFF
ABSOLUTE NRBC: 0 10*3/uL (ref 0.00–0.01)
HCT: 24.9 % — ABNORMAL LOW (ref 36.6–50.3)
HGB: 7.8 g/dL — ABNORMAL LOW (ref 12.1–17.0)
MCH: 31.1 PG (ref 26.0–34.0)
MCHC: 31.3 g/dL (ref 30.0–36.5)
MCV: 99.2 FL — ABNORMAL HIGH (ref 80.0–99.0)
MPV: 10.4 FL (ref 8.9–12.9)
NRBC: 0 PER 100 WBC
PLATELET: 139 10*3/uL — ABNORMAL LOW (ref 150–400)
RBC: 2.51 M/uL — ABNORMAL LOW (ref 4.10–5.70)
RDW: 16.1 % — ABNORMAL HIGH (ref 11.5–14.5)
WBC: 5.2 10*3/uL (ref 4.1–11.1)

## 2016-05-17 LAB — METABOLIC PANEL, BASIC
Anion gap: 8 mmol/L (ref 5–15)
BUN/Creatinine ratio: 4 — ABNORMAL LOW (ref 12–20)
BUN: 30 MG/DL — ABNORMAL HIGH (ref 6–20)
CO2: 30 mmol/L (ref 21–32)
Calcium: 7.5 MG/DL — ABNORMAL LOW (ref 8.5–10.1)
Chloride: 103 mmol/L (ref 97–108)
Creatinine: 7.01 MG/DL — ABNORMAL HIGH (ref 0.70–1.30)
GFR est AA: 10 mL/min/{1.73_m2} — ABNORMAL LOW (ref >60)
GFR est non-AA: 9 mL/min/{1.73_m2} — ABNORMAL LOW (ref >60)
Glucose: 73 mg/dL (ref 65–100)
Potassium: 4.6 mmol/L (ref 3.5–5.1)
Sodium: 141 mmol/L (ref 136–145)

## 2016-05-17 LAB — CULTURE, STOOL
Campylobacter antigen: NEGATIVE
Shiga toxin-producing E. coli Ag: NEGATIVE

## 2016-05-17 MED ORDER — ALBUTEROL SULFATE HFA 90 MCG/ACTUATION AEROSOL INHALER
90 mcg/actuation | Freq: Three times a day (TID) | RESPIRATORY_TRACT | Status: DC | PRN
Start: 2016-05-17 — End: 2016-05-17
  Administered 2016-05-17: 16:00:00 via RESPIRATORY_TRACT

## 2016-05-17 MED FILL — SEVELAMER CARBONATE 800 MG TAB: 800 mg | ORAL | Qty: 1

## 2016-05-17 MED FILL — VENTOLIN HFA 90 MCG/ACTUATION AEROSOL INHALER: 90 mcg/actuation | RESPIRATORY_TRACT | Qty: 8

## 2016-05-17 MED FILL — RISAQUAD 8 BILLION CELL CAPSULE: 8 billion cell | ORAL | Qty: 1

## 2016-05-17 MED FILL — BD POSIFLUSH NORMAL SALINE 0.9 % INJECTION SYRINGE: INTRAMUSCULAR | Qty: 20

## 2016-05-17 MED FILL — PROCRIT 4,000 UNIT/ML INJECTION SOLUTION: 4000 unit/mL | INTRAMUSCULAR | Qty: 2

## 2016-05-17 MED FILL — HYDRALAZINE 50 MG TAB: 50 mg | ORAL | Qty: 2

## 2016-05-17 MED FILL — HYDROMORPHONE 2 MG/ML INJECTION SOLUTION: 2 mg/mL | INTRAMUSCULAR | Qty: 1

## 2016-05-17 MED FILL — BD POSIFLUSH NORMAL SALINE 0.9 % INJECTION SYRINGE: INTRAMUSCULAR | Qty: 10

## 2016-05-17 MED FILL — IPRATROPIUM-ALBUTEROL 2.5 MG-0.5 MG/3 ML NEB SOLUTION: 2.5 mg-0.5 mg/3 ml | RESPIRATORY_TRACT | Qty: 3

## 2016-05-17 MED FILL — AMLODIPINE 5 MG TAB: 5 mg | ORAL | Qty: 2

## 2016-05-17 MED FILL — FUROSEMIDE 80 MG TAB: 80 mg | ORAL | Qty: 1

## 2016-05-17 MED FILL — CARVEDILOL 12.5 MG TAB: 12.5 mg | ORAL | Qty: 1

## 2016-05-17 MED FILL — HEPARIN (PORCINE) 5,000 UNIT/ML IJ SOLN: 5000 unit/mL | INTRAMUSCULAR | Qty: 1

## 2016-05-17 NOTE — Procedures (Signed)
[facility] [place]  [phone]    Vitals   Pre   Post   Assessment   Pre   Post     Temp  Temp: 98.7 ??F (37.1 ??C) (05/17/16 0800)  98 LOC  Alert and oriented x4 same   HR   Pulse (Heart Rate): 90 (05/17/16 0730) 72 Lungs   Clear on 2lnc  same   B/P   BP: (!) 191/107 (05/17/16 0730) 197/115 Cardiac   B/p elevated same    Resp   Resp Rate: 20 (05/17/16 0730) 22 Skin   intact  same   Pain level  Showing no signs of pain same Edema  None noted     same   Orders:    Duration:   Start:    0730 End:    1007 Total:   2.5hrs   Dialyzer:   Dialyzer/Set Up Inspection: Revaclear (05/17/16 0730)   K Bath:   Dialysate K (mEq/L): 2 (05/17/16 0730)   Ca Bath:   Dialysate CA (mEq/L): 2.5 (05/17/16 0730)   Na/Bicarb:   Dialysate NA (mEq/L): 140 (05/17/16 0730)   Target Fluid Removal:   Goal/Amount of Fluid to Remove (mL): 3500 mL (05/17/16 0730)   Access     Type & Location:   Lt subclavian perma-cath, dressing clean dry and intact from 05/15/16, no signs of infection noted. Ports cleaned, blood aspirated and lines flushed without any issues. Good blood flow from both ports   Labs     Obtained/Reviewed   Critical Results Called   Date when labs were drawn-  Hgb-    HGB   Date Value Ref Range Status   05/17/2016 7.8 (L) 12.1 - 17.0 g/dL Final     K-    Potassium   Date Value Ref Range Status   05/17/2016 4.6 3.5 - 5.1 mmol/L Final     Comment:     INVESTIGATED PER DELTA CHECK PROTOCOL     Ca-   Calcium   Date Value Ref Range Status   05/17/2016 7.5 (L) 8.5 - 10.1 MG/DL Final     Bun-   BUN   Date Value Ref Range Status   05/17/2016 30 (H) 6 - 20 MG/DL Final     Creat-   Creatinine   Date Value Ref Range Status   05/17/2016 7.01 (H) 0.70 - 1.30 MG/DL Final     Comment:     INVESTIGATED PER DELTA CHECK PROTOCOL        Medications/ Blood Products Given     Name   Dose   Route and Time        None ordered to be given during tx             Blood Volume Processed (BVP):    57.1 Net Fluid   Removed:  2.5kg    Comments   Time Out Done: [id number]  Primary Nurse Rpt Pre: Dahlia ClientHannah Overschmidt at 0700  Primary Nurse Rpt Post:[name] at 1015  Pt Education:ESRD  Care Plan:ESRD  Tx Summary:  0730 dialysis started. Lab work drawn at start and sent to lab  0800 pt complaining that he cant breath, primary nurse notified while I checked his 02sat's 95 on 2lnc.Patietn states you can't go by his o2sats. Primary nurse informed Respiratory which came in to give patient a breathing treatment, pt then states he wants his inhaler and not a breathing treatment. Respiratory tech informed pt that he will need an order  from the Md to change his breathing treatment to an inhaler. Patient stated that was fine  0810 Patient sleeping at this time, no respiratory distress noted  0915 b/p trending upward and while primary nurse at bedside to give scheduled b/p meds pt refusing meds stating he will take them after dialysis. Pt informed that b/p is in the 200's systolic but pt still refusing to take schedule b/p meds  1007 Dialysis ended per pt request. Patient would not give a reason why he wanted to stop his treatment and pt informed that he still had 52 minutes left in his treatment. Patient stated " Just take me off". Treatment stopped and blood rinsed back. New caps placed on each port. pt stable. A few minutes after patient was disconnected, patient hit the call bell to request pain medication wven though he is not showing any signs of pain at present.   1015 Report given to nurse, pt stable   1035 Dr Deeann Saint informed about pt request to end treatment and then requesting pain medication after he was disconnected from the machine.  Admiting Diagnosis:C-diff colitis (1st stool sample negative for this admission)  Pt's previous clinic-[place]  Consent signed - Informed Consent Verified: Yes (05/17/16 0730)  [facility] Consent - on file  Hepatitis Status- Pending 05/17/16  Machine #- Machine Number: [id number] (05/17/16 0730)   Telemetry status-not on teleemtry  Pre-dialysis wt.- Pre-Dialysis Weight: 105.7 kg (233 lb 0.4 oz) (05/15/16 2300)

## 2016-05-17 NOTE — Progress Notes (Signed)
Expand AllCollapse All     ??OT note:  Orders received. Chart reviewed. Attempted to see patient for OT evaluation, however pt currently receiving dialysis at bedside. Will defer and follow back later today or tomorrow as time permits and if pt is appropriate. Noted nursing reports that patient is mobilizing independently in room.   ??     ??      ?? ??   ??

## 2016-05-17 NOTE — Progress Notes (Addendum)
In to see patient, and give him his BP medications. Pt is refusing at this time and states that he will not take it until after he is done with dialysis.  BP is 220/104. Pt is requesting an inhaler because he doesn't want the nebulizer.  MD paged  [phone] Dr. Skeet Latchiesla made aware of BP, and wanted albuteral inhaler instead of nebulizer  1007 Pt requesting to be taken off of dialysis, and wants IV pain medication  1040 Pt requesting IV pain medication, primary nurse in room patient is sleeping.  Pain medication held until patient wakes up.  1104 in to given patient BP medications and dilaudid, advised that he could only have a sip of water to take his pills.  Stated that he didn't need to be NPO and wanted ginger ale.  Explained to patient that his procedure that is scheduled for later today that he needs to remain NPO.  Pt is refusing to be NPO and states that he is going to drink ginger ale, because he doesn't need to be NPO.  1220 Patient requesting AMA papers, Dr. Skeet Latchiesla paged x2. Awating return call,patient has signed AMA papers, asked him to wait for Dr. Skeet Latchiesla to come see him.  Pt is refusing to wait.  Pt ambulated off unit, did not see Dr. Skeet Latchiesla prior to leaving. AMA paper on chart.

## 2016-05-17 NOTE — Discharge Summary (Signed)
Hospitalist Discharge Note    NAME:  David [name]   DOB:   Sep 23, 1970   MRN:   [id number]       Admit date: 05/15/2016    Discharge Date: 05/17/2016    Discharge Diagnoses:    Generalized abdominal pain POA    Diarrhea POA with no colitis on CT scan    Anasarca POA : had missed 2 HD treatment, HD in house    ESRD on HD POA    Hyperkalemia improved  ??  Essential HTN POA  ??  Asthma POA    Left AMA  ??  Code Status:??Full Code    PCP: None      Summary of admission H+P(copied from Dr Mila MerryMencias's  Note):    CHIEF COMPLAINT: "My belly hurts and have diarrhea"  ??  HISTORY OF PRESENT ILLNESS:     David [name] is a 46 y.o.  African American male ??with PMHx significant for CKD, HTN, asthma, ascites, presents ambulatory??to the ED with cc of persistent diarrhea x 5 days. He reports associated symptoms of a central abdominal pain radiating to his back, nausea, vomiting, SOB, and a fever of 101F this AM. Pt has had C. Diff before in December, which did not require any surgical intervention. He notes he was recently placed on an antibiotic, to which he had finished x 2 weeks ago. [place] 1[place] (Monday) and has access via a port-a-cath. He notes he was due for dialysis on 1/22, but was "kicked out." He notes he has had about 3-4L of fluid drawn before and has had a paracentesis once. He denies any infection being seen in the fluid at the time. He denies the sensation of being fluid overloaded. Pt denies blood in his stool.  At this time patient is lying in bed c/o abdominal pain, N/V and diarrhea, has large ascites and low extremity edema, last HD had been on 01/22, denies chest pain, no SOB, no fever, no cough, no urinary symptoms, no other associated symptoms.  We were asked to admit for work up and evaluation of the above problems.   ??       Past Medical History:   Diagnosis Date   ??? Adverse effect of anesthesia ??   ?? sleep apnea uses oxygen at night    ??? Ascites ??    ??? Asthma ??   ??? C. difficile colitis ??   ??? CKD (chronic kidney disease) stage V requiring chronic dialysis (HCC) ??   ??? HTN (hypertension) ??   ??? SBP (spontaneous bacterial peritonitis) (HCC) ??      ??        Past Surgical History:   Procedure Laterality Date   ??? COLONOSCOPY N/A 03/04/2016   ?? COLONOSCOPY performed by Sandy SalaamAlex R Seamon, MD at [facility]   ??? HX HERNIA REPAIR ?? ??   ??? SIGMOIDOSCOPY,DIAGNOSTIC ?? 03/04/2016   ?? ????   ??  ??        Social History   Substance Use Topics   ??? Smoking status: Heavy Tobacco Smoker   ?? ?? Packs/day: 0.50   ?? ?? Years: 10.00   ??? Smokeless tobacco: Never Used   ??? Alcohol use No       Hospital course:     Abdominal pain POA  Diarrhea POA with no colitis on CT scan  No SIRS criteria  CT with increased ascites otherwise no acute  pathology  Paracentesis with no evidence of SBP  C difficile negative in stool  Stool culture negative so far  Pt reports intractable N/V and diarrhea, not witnessed by NS  Stop ceftriaxone and vancomycin  Will follow cultures  GI saw  LVP in AM to see if it improves symptoms  IV dilaudid for now, d/w pt weaning to PO in 24 hours  Pt signed out AMA, did not wait for me to come talk to him, just like last admit    Anasarca POA : had missed 2 HD treatment, HD in house    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  Essential HTN POA  ??c/w Norvasc, Coreg, hydralazine, use labetalol prn.  ??  Asthma: not wheezing at this time, use Duonebs prn.  ??  Code Status:??Full Code      Time spent on Discharge:   I spent greater than 30 minutes on discharge, seeing and examining the patient, reconciling home meds and new meds, coordinating care with case management, doing the discharge papers and the D/C summary    Please see my progress note on day of discharge for discharge exam and history    Discharge disposition: left AMA    Discharge Condition: Stable     ___________________________________________________    Hospitalist: Tawny Asal, MD     ___________________________________________________    **Lab Data Reviewed:  No results found for this or any previous visit (from the past 24 hour(s)).

## 2016-05-17 NOTE — Consults (Signed)
GI Consultation Note David [name] for David [name])  NAME: David [name] DOB: February 05, 1971 MRN: [id number]   ATTG: '@ATTPHYS' @ PCP: None  Date/Time:  05/17/2016 12:05 PM  Subjective:   REASON FOR CONSULT:    AP/n/diarrhea/h/o C diff    David [name] is a 46 y.o.  male who I was asked to see for above. Please note pt has a h/o non-compliance and lives in [place] where he often goes to [facility] but more recently has been going to [facility], both [facility] and [facility]. Has a h/o recurrent C diff but C diff this admission was negative. Reports n/v/AP x1 week along with ascites.       Past Medical History:   Diagnosis Date   ??? Adverse effect of anesthesia     sleep apnea uses oxygen at night    ??? Ascites    ??? Asthma    ??? C. difficile colitis    ??? CKD (chronic kidney disease) stage V requiring chronic dialysis ([place])    ??? HTN (hypertension)    ??? SBP (spontaneous bacterial peritonitis) [facility])       Past Surgical History:   Procedure Laterality Date   ??? COLONOSCOPY N/A 03/04/2016    COLONOSCOPY performed by Macie Burows, MD at [facility]   ??? HX HERNIA REPAIR     ??? SIGMOIDOSCOPY,DIAGNOSTIC  03/04/2016          Social History   Substance Use Topics   ??? Smoking status: Heavy Tobacco Smoker     Packs/day: 0.50     Years: 10.00   ??? Smokeless tobacco: Never Used   ??? Alcohol use No      Family History   Problem Relation Age of Onset   ??? Hypertension Mother       Allergies   Allergen Reactions   ??? Lisinopril Shortness of Breath   ??? Morphine Shortness of Breath   ??? Toradol [Ketorolac] Shortness of Breath      Home Medications:  Prior to Admission Medications   Prescriptions Last Dose Informant Patient Reported? Taking?   albuterol (PROVENTIL HFA, VENTOLIN HFA, PROAIR HFA) 90 mcg/actuation inhaler Unknown at Unknown time  Yes No   Sig: Take 2 Puffs by inhalation every four (4) hours as needed for Wheezing.   amLODIPine (NORVASC) 10 mg tablet Unknown at Unknown time  Yes No   Sig: Take 10 mg by mouth daily.    carvedilol (COREG) 12.5 mg tablet Unknown at Unknown time  Yes No   Sig: Take  by mouth two (2) times daily (with meals).   furosemide (LASIX) 80 mg tablet Unknown at Unknown time  Yes No   Sig: Take 80 mg by mouth daily.   hydrALAZINE (APRESOLINE) 100 mg tablet Unknown at Unknown time  Yes No   Sig: Take 100 mg by mouth three (3) times daily.   pantoprazole (PROTONIX) 40 mg tablet Unknown at Unknown time  Yes No   Sig: Take 40 mg by mouth daily.   sevelamer (RENAGEL) 400 mg tablet Unknown at Unknown time  Yes No   Sig: Take 800 mg by mouth three (3) times daily (with meals).      Facility-Administered Medications: None     Hospital medications:  Current Facility-Administered Medications   Medication Dose Route Frequency   ??? albuterol (PROVENTIL HFA, VENTOLIN HFA, PROAIR HFA) inhaler 2 Puff  2 Puff Inhalation Q8H PRN   ??? HYDROmorphone (DILAUDID) injection 1 mg  1 mg IntraVENous Q4H PRN   ???  amLODIPine (NORVASC) tablet 10 mg  10 mg Oral DAILY   ??? carvedilol (COREG) tablet 12.5 mg  12.5 mg Oral BID WITH MEALS   ??? furosemide (LASIX) tablet 80 mg  80 mg Oral DAILY   ??? hydrALAZINE (APRESOLINE) tablet 100 mg  100 mg Oral TID   ??? sevelamer carbonate (RENVELA) tab 800 mg  800 mg Oral TID WITH MEALS   ??? albuterol-ipratropium (DUO-NEB) 2.5 MG-0.5 MG/3 ML  3 mL Nebulization Q6H PRN   ??? acetaminophen (TYLENOL) tablet 650 mg  650 mg Oral Q4H PRN   ??? ondansetron (ZOFRAN) injection 4 mg  4 mg IntraVENous Q4H PRN   ??? oxyCODONE IR (ROXICODONE) tablet 5 mg  5 mg Oral Q4H PRN   ??? heparin (porcine) injection 5,000 Units  5,000 Units SubCUTAneous Q12H   ??? labetalol (NORMODYNE;TRANDATE) injection 10 mg  10 mg IntraVENous Q6H PRN   ??? sodium chloride (NS) flush 5-10 mL  5-10 mL IntraVENous Q8H   ??? sodium chloride (NS) flush 5-10 mL  5-10 mL IntraVENous PRN   ??? insulin lispro (HUMALOG) injection   SubCUTAneous AC&HS   ??? glucose chewable tablet 16 g  4 Tab Oral PRN   ??? dextrose (D50W) injection syrg 12.5-25 g  12.5-25 g IntraVENous PRN    ??? glucagon (GLUCAGEN) injection 1 mg  1 mg IntraMUSCular PRN   ??? lactobac ac& pc-s.therm-b.anim (FLORA Q/RISAQUAD)  1 Cap Oral DAILY   ??? epoetin alfa (EPOGEN;PROCRIT) injection 8,000 Units  8,000 Units SubCUTAneous Q MON, WED & FRI     REVIEW OF SYSTEMS:     '[]'      Unable to obtain  ROS due to  '[]'     mental status change  '[]'     sedated   '[]'     intubated   '[x]'     Total of 11 systems reviewed as follows:  Const:  negative fever, negative chills, negative weight loss  Eyes:   negative diplopia or visual changes, negative eye pain  ENT:   negative coryza, negative sore throat  Resp:   negative cough, hemoptysis, dyspnea  Cards:  negative for chest pain, palpitations, lower extremity edema  GU:  negative for frequency, dysuria and hematuria  Skin:   negative for rash and pruritus  Heme:  negative for easy bruising and gum/nose bleeding  MS:  negative for myalgias, arthralgias, back pain and muscle weakness  Neurolo:  negative for headaches, dizziness, vertigo, memory problems   Psych:  negative for feelings of anxiety, depression     Pertinent Positives include :    Objective:   VITALS:    Visit Vitals   ??? BP (!) 185/112 (BP 1 Location: Right arm, BP Patient Position: At rest)   ??? Pulse 96   ??? Temp 98.3 ??F (36.8 ??C)   ??? Resp 20   ??? Ht '6\' 1"'  (1.854 m)   ??? Wt 100.2 kg (220 lb 14.4 oz)   ??? SpO2 97%   ??? BMI 29.14 kg/m2     Temp (24hrs), Avg:98.5 ??F (36.9 ??C), Min:98.2 ??F (36.8 ??C), Max:98.9 ??F (37.2 ??C)    PHYSICAL EXAM:   General:    Alert, cooperative, no distress, appears stated age.     Head:   Normocephalic, without obvious abnormality, atraumatic.  Eyes:   Conjunctivae clear, anicteric sclerae.  Pupils are equal  Nose:  Nares normal. No drainage or sinus tenderness.  Throat:    Lips, mucosa, and tongue normal.  No Thrush  Neck:  Supple,  symmetrical,  no adenopathy, thyroid: non tender  Back:    Symmetric,  No CVA tenderness.  Lungs:   CTA bilaterally.  No wheezing/rhonchi/rales.   Chest wall:  No tenderness or deformity. No Accessory muscle use.  Heart:   Regular rate and rhythm,  no murmur, rub or gallop.  Abdomen:   Soft, +diffusely TTP w/o peritoneal signs, +shiftin dullness. Not distended.  Bowel sounds normal. No masses  Extremities: Atraumatic, No cyanosis.  No edema. No clubbing  Skin:     Texture, turgor normal. No rashes/lesions/jaundice  Lymph: Cervical, supraclavicular normal.  Psych:  Good insight.  Not depressed.  Not anxious or agitated.  Neurologic: EOMs intact. No facial asymmetry. No aphasia or slurred speech. Normal  strength, A/O X 3.     LAB DATA REVIEWED:    Recent Results (from the past 48 hour(s))   C. DIFFICILE (DNA)    Collection Time: 05/15/16 12:25 PM   Result Value Ref Range    C. difficile (DNA) NEGATIVE  NEG     CULTURE, STOOL    Collection Time: 05/15/16 12:25 PM   Result Value Ref Range    Special Requests: NO SPECIAL REQUESTS      Campylobacter antigen NEGATIVE      Shiga toxin-producing E. coli Ag NEGATIVE      Culture result:        NO ROUTINE ENTERIC PATHOGENS ISOLATED INCLUDING SALMONELLA, SHIGELLA, YERSINIA, VIBRIO OR SHIGA TOXIN PRODUCING E. COLI   CBC WITH AUTOMATED DIFF    Collection Time: 05/15/16  1:11 PM   Result Value Ref Range    WBC 5.9 4.1 - 11.1 K/uL    RBC 2.82 (L) 4.10 - 5.70 M/uL    HGB 8.6 (L) 12.1 - 17.0 g/dL    HCT 27.2 (L) 36.6 - 50.3 %    MCV 96.5 80.0 - 99.0 FL    MCH 30.5 26.0 - 34.0 PG    MCHC 31.6 30.0 - 36.5 g/dL    RDW 16.5 (H) 11.5 - 14.5 %    PLATELET 133 (L) 150 - 400 K/uL    MPV 10.7 8.9 - 12.9 FL    NRBC 0.0 0 PER 100 WBC    ABSOLUTE NRBC 0.00 0.00 - 0.01 K/uL    NEUTROPHILS 73 32 - 75 %    LYMPHOCYTES 9 (L) 12 - 49 %    MONOCYTES 8 5 - 13 %    EOSINOPHILS 9 (H) 0 - 7 %    BASOPHILS 1 0 - 1 %    IMMATURE GRANULOCYTES 0 0.0 - 0.5 %    ABS. NEUTROPHILS 4.3 1.8 - 8.0 K/UL    ABS. LYMPHOCYTES 0.5 (L) 0.8 - 3.5 K/UL    ABS. MONOCYTES 0.5 0.0 - 1.0 K/UL    ABS. EOSINOPHILS 0.5 (H) 0.0 - 0.4 K/UL     ABS. BASOPHILS 0.1 0.0 - 0.1 K/UL    ABS. IMM. GRANS. 0.0 0.00 - 0.04 K/UL    DF AUTOMATED      RBC COMMENTS ANISOCYTOSIS  1+        RBC COMMENTS POLYCHROMASIA  PRESENT       LIPASE    Collection Time: 05/15/16  1:11 PM   Result Value Ref Range    Lipase 189 73 - 393 U/L   MAGNESIUM    Collection Time: 05/15/16  1:11 PM   Result Value Ref Range    Magnesium 2.4 1.6 - 2.4 mg/dL   PHOSPHORUS    Collection  Time: 05/15/16  1:11 PM   Result Value Ref Range    Phosphorus 4.5 2.6 - 4.7 MG/DL   CULTURE, BLOOD, PAIRED    Collection Time: 05/15/16  1:11 PM   Result Value Ref Range    Special Requests: NO SPECIAL REQUESTS      Culture result: NO GROWTH 2 DAYS     LACTIC ACID    Collection Time: 05/15/16  1:11 PM   Result Value Ref Range    Lactic acid 1.1 0.4 - 2.0 MMOL/L   METABOLIC PANEL, COMPREHENSIVE    Collection Time: 05/15/16  1:11 PM   Result Value Ref Range    Sodium 137 136 - 145 mmol/L    Potassium 5.6 (H) 3.5 - 5.1 mmol/L    Chloride 100 97 - 108 mmol/L    CO2 27 21 - 32 mmol/L    Anion gap 10 5 - 15 mmol/L    Glucose 84 65 - 100 mg/dL    BUN 42 (H) 6 - 20 MG/DL    Creatinine 7.54 (H) 0.70 - 1.30 MG/DL    BUN/Creatinine ratio 6 (L) 12 - 20      GFR est AA 10 (L) >60 ml/min/1.27m    GFR est non-AA 8 (L) >60 ml/min/1.799m   Calcium 8.1 (L) 8.5 - 10.1 MG/DL    Bilirubin, total 0.5 0.2 - 1.0 MG/DL    ALT (SGPT) 13 12 - 78 U/L    AST (SGOT) 24 15 - 37 U/L    Alk. phosphatase 292 (H) 45 - 117 U/L    Protein, total 6.9 6.4 - 8.2 g/dL    Albumin 3.0 (L) 3.5 - 5.0 g/dL    Globulin 3.9 2.0 - 4.0 g/dL    A-G Ratio 0.8 (L) 1.1 - 2.2     CELL COUNT, BODY FLUID    Collection Time: 05/15/16  3:52 PM   Result Value Ref Range    BODY FLUID TYPE PERITONEAL FLUID      FLUID COLOR STRAW      FLUID APPEARANCE CLOUDY      FLUID RBC CT. >100 (H) 0 /cu mm    FLUID NUCLEATED CELLS 113 /cu mm    FLD NEUTROPHILS 2 (A) NRRE %    FLD LYMPHS 14 (A) NRRE %    FLD MONO/MACROPHAGES 1 (A) NRRE %    FLUID MESOTHELIAL 83 (A) NRRE %    CULTURE, BODY FLUID W GRAM STAIN    Collection Time: 05/15/16  4:02 PM   Result Value Ref Range    Special Requests: NO SPECIAL REQUESTS      GRAM STAIN NO WBC'S SEEN      GRAM STAIN NO ORGANISMS SEEN      GRAM STAIN Culture performed on Fluid swab specimen      Culture result: NO GROWTH AFTER 14 HOURS     EKG, 12 LEAD, INITIAL    Collection Time: 05/15/16  4:06 PM   Result Value Ref Range    Ventricular Rate 93 BPM    Atrial Rate 93 BPM    P-R Interval 184 ms    QRS Duration 84 ms    Q-T Interval 406 ms    QTC Calculation (Bezet) 504 ms    Calculated P Axis 54 degrees    Calculated R Axis -16 degrees    Calculated T Axis 68 degrees    Diagnosis       Normal sinus rhythm  Possible Left atrial enlargement  Prolonged  QT  Abnormal ECG  When compared with ECG of 08-Mar-2016 07:21,  No significant change was found  Confirmed by Dennie Bible [phone]) on 05/16/2016 5:02:06 PM     PROTHROMBIN TIME + INR    Collection Time: 05/15/16  5:05 PM   Result Value Ref Range    INR 1.1 0.9 - 1.1      Prothrombin time 11.2 (H) 9.0 - 11.1 sec   CBC WITH AUTOMATED DIFF    Collection Time: 05/16/16  2:01 AM   Result Value Ref Range    WBC 5.2 4.1 - 11.1 K/uL    RBC 2.78 (L) 4.10 - 5.70 M/uL    HGB 8.4 (L) 12.1 - 17.0 g/dL    HCT 26.8 (L) 36.6 - 50.3 %    MCV 96.4 80.0 - 99.0 FL    MCH 30.2 26.0 - 34.0 PG    MCHC 31.3 30.0 - 36.5 g/dL    RDW 16.1 (H) 11.5 - 14.5 %    PLATELET 123 (L) 150 - 400 K/uL    MPV 10.5 8.9 - 12.9 FL    NRBC 0.0 0 PER 100 WBC    ABSOLUTE NRBC 0.00 0.00 - 0.01 K/uL    NEUTROPHILS 70 32 - 75 %    LYMPHOCYTES 9 (L) 12 - 49 %    MONOCYTES 9 5 - 13 %    EOSINOPHILS 11 (H) 0 - 7 %    BASOPHILS 1 0 - 1 %    IMMATURE GRANULOCYTES 0 0.0 - 0.5 %    ABS. NEUTROPHILS 3.6 1.8 - 8.0 K/UL    ABS. LYMPHOCYTES 0.5 (L) 0.8 - 3.5 K/UL    ABS. MONOCYTES 0.5 0.0 - 1.0 K/UL    ABS. EOSINOPHILS 0.6 (H) 0.0 - 0.4 K/UL    ABS. BASOPHILS 0.0 0.0 - 0.1 K/UL    ABS. IMM. GRANS. 0.0 0.00 - 0.04 K/UL    DF AUTOMATED     MAGNESIUM     Collection Time: 05/16/16  2:01 AM   Result Value Ref Range    Magnesium 2.2 1.6 - 2.4 mg/dL   METABOLIC PANEL, COMPREHENSIVE    Collection Time: 05/16/16  2:01 AM   Result Value Ref Range    Sodium 141 136 - 145 mmol/L    Potassium 3.5 3.5 - 5.1 mmol/L    Chloride 103 97 - 108 mmol/L    CO2 30 21 - 32 mmol/L    Anion gap 8 5 - 15 mmol/L    Glucose 95 65 - 100 mg/dL    BUN 21 (H) 6 - 20 MG/DL    Creatinine 4.19 (H) 0.70 - 1.30 MG/DL    BUN/Creatinine ratio 5 (L) 12 - 20      GFR est AA 19 (L) >60 ml/min/1.74m    GFR est non-AA 15 (L) >60 ml/min/1.750m   Calcium 8.0 (L) 8.5 - 10.1 MG/DL    Bilirubin, total 0.5 0.2 - 1.0 MG/DL    ALT (SGPT) 13 12 - 78 U/L    AST (SGOT) 20 15 - 37 U/L    Alk. phosphatase 273 (H) 45 - 117 U/L    Protein, total 6.6 6.4 - 8.2 g/dL    Albumin 2.9 (L) 3.5 - 5.0 g/dL    Globulin 3.7 2.0 - 4.0 g/dL    A-G Ratio 0.8 (L) 1.1 - 2.2     PHOSPHORUS    Collection Time: 05/16/16  2:01 AM   Result Value Ref Range  Phosphorus 2.6 2.6 - 4.7 MG/DL   TSH 3RD GENERATION    Collection Time: 05/16/16  2:01 AM   Result Value Ref Range    TSH 1.90 0.36 - 3.74 uIU/mL   WBC, STOOL    Collection Time: 05/16/16 11:43 AM   Result Value Ref Range    White blood cells, stool 0 TO 4 0 - 4 /HPF   CBC W/O DIFF    Collection Time: 05/17/16  7:30 AM   Result Value Ref Range    WBC 5.2 4.1 - 11.1 K/uL    RBC 2.51 (L) 4.10 - 5.70 M/uL    HGB 7.8 (L) 12.1 - 17.0 g/dL    HCT 24.9 (L) 36.6 - 50.3 %    MCV 99.2 (H) 80.0 - 99.0 FL    MCH 31.1 26.0 - 34.0 PG    MCHC 31.3 30.0 - 36.5 g/dL    RDW 16.1 (H) 11.5 - 14.5 %    PLATELET 139 (L) 150 - 400 K/uL    MPV 10.4 8.9 - 12.9 FL    NRBC 0.0 0 PER 100 WBC    ABSOLUTE NRBC 0.00 0.00 - 1.61 K/uL   METABOLIC PANEL, BASIC    Collection Time: 05/17/16  7:30 AM   Result Value Ref Range    Sodium 141 136 - 145 mmol/L    Potassium 4.6 3.5 - 5.1 mmol/L    Chloride 103 97 - 108 mmol/L    CO2 30 21 - 32 mmol/L    Anion gap 8 5 - 15 mmol/L    Glucose 73 65 - 100 mg/dL     BUN 30 (H) 6 - 20 MG/DL    Creatinine 7.01 (H) 0.70 - 1.30 MG/DL    BUN/Creatinine ratio 4 (L) 12 - 20      GFR est AA 10 (L) >60 ml/min/1.69m    GFR est non-AA 9 (L) >60 ml/min/1.751m   Calcium 7.5 (L) 8.5 - 10.1 MG/DL     IMAGING RESULTS:   '[x]'       I have personally reviewed the actual   '[]'     CXR  '[x]'     CT  '[]'      [place]  Assessment/Plan:      Active Problems:    Peritonitis ([facility]11[facility])      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 difficile colitis (05/15/2016)      Hyperkalemia (05/15/2016)       1. N/v/AP/diarrhea (though apparently no n/v/diarrhea witnessed by Nursing)  2. H/o recurrent C diff with negative C diff on this admission  3. Recurrent ascites NOT 2/2 portal hypertension (low SAAG ascites and prior liver bx in [name] per pt normal)  4. Diagnostic paracentesis during this hospitalization negative for SBP  5. H/o non-compliance  ___________________________________________________  RECOMMENDATIONS:     - agree with plan for LVP as this likely will improve pt's symptoms  - will defer to Hospitalist/Nephrologist for management of ascites as this is not 2/2 portal hypertension  - ID consult on 11/15 made recommendations of what studies to send on repeat paracentesis fluid  - possible EGD pending response to LVP      Discussed Code Status:    '[]'     Full Code      '[]'     DNR    ___________________________________________________  Care Plan discussed with:    '[x]'     Patient   '[]'     Family   '[]'     Nursing   '[]'     Attending     ___________________________________________________  GI: Regenia Skeeter, MD

## 2016-05-17 NOTE — Progress Notes (Signed)
Problem: Patient Education: Go to Patient Education Activity  Goal: Patient/Family Education  Outcome: Not Progressing Towards Goal  Non compliant

## 2016-05-17 NOTE — Progress Notes (Addendum)
Physical Therapy Note    Orders received. Chart reviewed. Attempted to see patient for PT evaluation, however pt currently receiving dialysis at bedside. Will defer and follow back later today or tomorrow as time permits and if pt is appropriate. Noted nursing reports that patient is mobilizing independently in room.     Thank you,  Warnell ForesterIrene Zaccone, PT, DPT    11:55 AM: Attempted to follow back to see patient for PT evaluation. Pt received standing in room talking on the phone. Demonstrated good standing balance and ROM in static stance. Pt reporting baseline strength, balance, and gait and notes no therapy needs at this time. States no difficulties with bathing or dressing. Will sign off as pt is at his functional baseline.    Thank you,  Warnell ForesterIrene Zaccone, PT, DPT

## 2016-05-17 NOTE — Progress Notes (Signed)
NAME: David [name]        DOB:  12/13/1970        MRN:  [id number]        Assessment :    Plan:  --ESRD-  Anemia  N/V/diarrhea  Noncompliance --discharged from [name] unit-MWF HD; HD today.      Will need to find an outpatient HD unit which will likely be a challenge. Our group is not accepting him to our outpatient units.         Subjective:     Chief Complaint:  Resting. The patient was seen on dialysis at 10:04 AM .  BP is stable. Access is working well.   Review of Systems:    Symptom Y/N Comments  Symptom Y/N Comments   Fever/Chills    Chest Pain     Poor Appetite    Edema     Cough    Abdominal Pain     Sputum    Joint Pain     SOB/DOE    Pruritis/Rash     Nausea/vomit    Tolerating PT/OT     Diarrhea    Tolerating Diet     Constipation    Other       Could not obtain due to:      Objective:     VITALS:   Last 24hrs VS reviewed since prior progress note. Most recent are:  Visit Vitals   ??? BP (!) 137/95 (BP 1 Location: Right arm, BP Patient Position: Sitting)   ??? Pulse 97   ??? Temp 98.9 ??F (37.2 ??C)   ??? Resp 20   ??? Ht 6\' 1"  (1.854 m)   ??? Wt 106 kg (233 lb 11 oz)   ??? SpO2 97%   ??? BMI 30.83 kg/m2     No intake or output data in the 24 hours ending 05/17/16 4098   Telemetry Reviewed:     PHYSICAL EXAM:  General: NAD  CTA  abd soft  No edema      Lab Data Reviewed: (see below)    Medications Reviewed: (see below)    PMH/SH reviewed - no change compared to H&P  ________________________________________________________________________  Care Plan discussed with:  Patient     Family      RN y    Care Manager                    Consultant:          Comments   >50% of visit spent in counseling and coordination of care       ________________________________________________________________________  Andrez Grime, MD     Procedures: see electronic medical records for all procedures/Xrays and details which   were not copied into this note but were reviewed prior to creation of Plan.      LABS:  Recent Labs      05/16/16   0201  05/15/16   1311   WBC  5.2  5.9   HGB  8.4*  8.6*   HCT  26.8*  27.2*   PLT  123*  133*     Recent Labs      05/16/16   0201  05/15/16   1311   NA  141  137   K  3.5  5.6*   CL  103  100   CO2  30  27   BUN  21*  42*   CREA  4.19*  7.54*   GLU  95  84   CA  8.0*  8.1*   MG  2.2  2.4   PHOS  2.6  4.5     Recent Labs      05/16/16   0201  05/15/16   1311   SGOT  20  24   AP  273*  292*   TP  6.6  6.9   ALB  2.9*  3.0*   GLOB  3.7  3.9   LPSE   --   189     Recent Labs      05/15/16   1705   INR  1.1   PTP  11.2*      No results for input(s): FE, TIBC, PSAT, FERR in the last 72 hours.   No results found for: FOL, RBCF   No results for input(s): PH, PCO2, PO2 in the last 72 hours.  No results for input(s): CPK, CKMB in the last 72 hours.    No lab exists for component: TROPONINI  No components found for: GLPOC  No results found for: COLOR, APPRN, SPGRU, REFSG, PHU, PROTU, GLUCU, KETU, BILU, UROU, NITU, LEUKU, GLUKE, EPSU, BACTU, WBCU, RBCU, CASTS, UCRY    MEDICATIONS:  Current Facility-Administered Medications   Medication Dose Route Frequency   ??? HYDROmorphone (DILAUDID) injection 1 mg  1 mg IntraVENous Q4H PRN   ??? amLODIPine (NORVASC) tablet 10 mg  10 mg Oral DAILY   ??? carvedilol (COREG) tablet 12.5 mg  12.5 mg Oral BID WITH MEALS   ??? furosemide (LASIX) tablet 80 mg  80 mg Oral DAILY   ??? hydrALAZINE (APRESOLINE) tablet 100 mg  100 mg Oral TID   ??? sevelamer carbonate (RENVELA) tab 800 mg  800 mg Oral TID WITH MEALS   ??? albuterol-ipratropium (DUO-NEB) 2.5 MG-0.5 MG/3 ML  3 mL Nebulization Q6H PRN   ??? acetaminophen (TYLENOL) tablet 650 mg  650 mg Oral Q4H PRN   ??? ondansetron (ZOFRAN) injection 4 mg  4 mg IntraVENous Q4H PRN   ??? oxyCODONE IR (ROXICODONE) tablet 5 mg  5 mg Oral Q4H PRN   ??? heparin (porcine) injection 5,000 Units  5,000 Units SubCUTAneous Q12H    ??? labetalol (NORMODYNE;TRANDATE) injection 10 mg  10 mg IntraVENous Q6H PRN   ??? sodium chloride (NS) flush 5-10 mL  5-10 mL IntraVENous Q8H   ??? sodium chloride (NS) flush 5-10 mL  5-10 mL IntraVENous PRN   ??? insulin lispro (HUMALOG) injection   SubCUTAneous AC&HS   ??? glucose chewable tablet 16 g  4 Tab Oral PRN   ??? dextrose (D50W) injection syrg 12.5-25 g  12.5-25 g IntraVENous PRN   ??? glucagon (GLUCAGEN) injection 1 mg  1 mg IntraMUSCular PRN   ??? lactobac ac& pc-s.therm-b.anim (FLORA Q/RISAQUAD)  1 Cap Oral DAILY   ??? epoetin alfa (EPOGEN;PROCRIT) injection 8,000 Units  8,000 Units SubCUTAneous Q MON, WED & FRI

## 2016-05-17 NOTE — Discharge Summary (Signed)
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  Essential HTN POA  ??  Asthma POA    Left AMA  ??  Code Status:??Full Code    PCP: None      Summary of admission H+P(copied from Dr Mila Merry  Note):    CHIEF COMPLAINT: "My belly hurts and have diarrhea"  ??  HISTORY OF PRESENT ILLNESS:     [name] is a 46 y.o.  African American male ??with PMHx significant for CKD, HTN, asthma, ascites, presents ambulatory??to the ED with cc of persistent diarrhea x 5 days. He reports associated symptoms of a central abdominal pain radiating to his back, nausea, vomiting, SOB, and a fever of 101F this AM. Pt has had C. Diff before in December, which did not require any surgical intervention. He notes he was recently placed on an antibiotic, to which he had finished x 2 weeks ago. He is also a dialysis pt from [name] with his last dialysis on 1/22 (Monday) and has access via a port-a-cath. He notes he was due for dialysis on 1/22, but was "kicked out." He notes he has had about 3-4L of fluid drawn before and has had a paracentesis once. He denies any infection being seen in the fluid at the time. He denies the sensation of being fluid overloaded. Pt denies blood in his stool.  At this time patient is lying in bed c/o abdominal pain, N/V and diarrhea, has large ascites and low extremity edema, last HD had been on 01/22, denies chest pain, no SOB, no fever, no cough, no urinary symptoms, no other associated symptoms.  We were asked to admit for work up and evaluation of the above problems.   ??       Past Medical History:   Diagnosis Date   ??? Adverse effect of anesthesia ??   ?? sleep apnea uses oxygen at night    ??? Ascites ??   ???  Asthma ??   ??? C. difficile colitis ??   ??? CKD (chronic kidney disease) stage V requiring chronic dialysis (HCC) ??   ??? HTN (hypertension) ??   ??? SBP (spontaneous bacterial peritonitis) (HCC) ??      ??        Past Surgical History:   Procedure Laterality Date   ??? COLONOSCOPY N/A 03/04/2016   ?? COLONOSCOPY performed by Sandy Salaam, MD at [facility]   ??? HX HERNIA REPAIR ?? ??   ??? SIGMOIDOSCOPY,DIAGNOSTIC ?? 03/04/2016   ?? ????   ??  ??        Social History   Substance Use Topics   ??? Smoking status: Heavy Tobacco Smoker   ?? ?? Packs/day: 0.50   ?? ?? Years: 10.00   ??? Smokeless tobacco: Never Used   ??? Alcohol use No       Hospital course:     Abdominal pain POA  Diarrhea POA with no colitis on CT scan  No SIRS criteria  CT with increased ascites otherwise no acute  pathology  Paracentesis with no evidence of SBP  C difficile negative in stool  Stool culture negative so far  Pt reports intractable N/V and diarrhea, not witnessed by NS  Stop ceftriaxone and vancomycin  Will follow cultures  GI saw  LVP in AM to see if it improves symptoms  IV dilaudid for now, d/w pt weaning to PO in 24 hours  Pt signed out AMA, did not wait for me to come talk to him, just like last admit    Anasarca POA : had missed 2 HD treatment, HD in house    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  Essential HTN POA  ??c/w Norvasc, Coreg, hydralazine, use labetalol prn.  ??  Asthma: not wheezing at this time, use Duonebs prn.  ??  Code Status:??Full Code      Time spent on Discharge:   I spent greater than 30 minutes on discharge, seeing and examining the patient, reconciling home meds and new meds, coordinating care with case management, doing the discharge papers and the D/C summary    Please see my progress note on day of discharge for discharge exam and history    Discharge disposition: left AMA    Discharge Condition:  Stable    ___________________________________________________    Hospitalist: Tawny AsalJr Julitza Rickles P Ciesla, MD     ___________________________________________________    **Lab Data Reviewed:  No results found for this or any previous visit (from the past 24 hour(s)).

## 2016-05-17 NOTE — Procedures (Signed)
 [facility] [place]  [facility]  [phone]    Vitals   Pre   Post   Assessment   Pre   Post     Temp  Temp: 98.7 F (37.1 C) (05/17/16 0800)  98 LOC  Alert and oriented x4 same   HR   Pulse (Heart Rate): 90 (05/17/16 0730) 72 Lungs   Clear on 2lnc  same   B/P   BP: (!) 191/107 (05/17/16 0730) 197/115 Cardiac   B/p elevated same    Resp   Resp Rate: 20 (05/17/16 0730) 22 Skin   intact  same   Pain level  Showing no signs of pain same Edema  None noted     same   Orders:    Duration:   Start:    0730 End:    1007 Total:   2.5hrs   Dialyzer:   Dialyzer/Set Up Inspection: Revaclear (05/17/16 0730)   K Bath:   Dialysate K (mEq/L): 2 (05/17/16 0730)   Ca Bath:   Dialysate CA (mEq/L): 2.5 (05/17/16 0730)   Na/Bicarb:   Dialysate NA (mEq/L): 140 (05/17/16 0730)   Target Fluid Removal:   Goal/Amount of Fluid to Remove (mL): 3500 mL (05/17/16 0730)   Access     Type & Location:   Lt subclavian perma-cath, dressing clean dry and intact from 05/15/16, no signs of infection noted. Ports cleaned, blood aspirated and lines flushed without any issues. Good blood flow from both ports   Labs     Obtained/Reviewed   Critical Results Called   Date when labs were drawn-  Hgb-    HGB   Date Value Ref Range Status   05/17/2016 7.8 (L) 12.1 - 17.0 g/dL Final     K-    Potassium   Date Value Ref Range Status   05/17/2016 4.6 3.5 - 5.1 mmol/L Final     Comment:     INVESTIGATED PER DELTA CHECK PROTOCOL     Ca-   Calcium   Date Value Ref Range Status   05/17/2016 7.5 (L) 8.5 - 10.1 MG/DL Final     Bun-   BUN   Date Value Ref Range Status   05/17/2016 30 (H) 6 - 20 MG/DL Final     Creat-   Creatinine   Date Value Ref Range Status   05/17/2016 7.01 (H) 0.70 - 1.30 MG/DL Final     Comment:     INVESTIGATED PER DELTA CHECK PROTOCOL        Medications/ Blood Products Given     Name   Dose   Route and Time        None ordered to be given during tx             Blood Volume Processed (BVP):    57.1 Net Fluid   Removed:  2.5kg    Comments   Time Out Done: 9284  Primary Nurse Rpt Pre: Hannah Overschmidt at 0700  Primary Nurse Rpt Post:[name]  at 1015  Pt Education:ESRD  Care Plan:ESRD  Tx Summary:  0730 dialysis started. Lab work drawn at start and sent to lab  0800 pt complaining that he cant breath, primary nurse notified while I checked his 02sat's 95 on 2lnc.Patietn states you can't go by his o2sats. Primary nurse informed Respiratory which came in to give patient a breathing treatment, pt then states he wants his inhaler and not a breathing treatment. Respiratory tech informed pt that he will need an order  from the Md to change his breathing treatment to an inhaler. Patient stated that was fine  0810 Patient sleeping at this time, no respiratory distress noted  0915 b/p trending upward and while primary nurse at bedside to give scheduled b/p meds pt refusing meds stating he will take them after dialysis. Pt informed that b/p is in the 200's systolic but pt still refusing to take schedule b/p meds  1007 Dialysis ended per pt request. Patient would not give a reason why he wanted to stop his treatment and pt informed that he still had 52 minutes left in his treatment. Patient stated  Just take me off. Treatment stopped and blood rinsed back. New caps placed on each port. pt stable. A few minutes after patient was disconnected, patient hit the call bell to request pain medication wven though he is not showing any signs of pain at present.   1015 Report given to nurse, pt stable   1035 Dr Van informed about pt request to end treatment and then requesting pain medication after he was disconnected from the machine.  Admiting Diagnosis:C-diff colitis (1st stool sample negative for this admission)  Pt's previous clinic-[place]  Consent signed - Informed Consent Verified: Yes (05/17/16 0730)  [facility] Consent - on file  Hepatitis Status- Pending 05/17/16  Machine #- Machine Number: [id number] (05/17/16 0730)  Telemetry  status-not on teleemtry  Pre-dialysis wt.- Pre-Dialysis Weight: 105.7 kg (233 lb 0.4 oz) (05/15/16 2300)

## 2016-05-17 NOTE — Consults (Signed)
Consults  by Regenia Skeeter, MD at 05/17/16 1201                Author: Regenia Skeeter, MD  Service: Gastroenterology  Author Type: Physician       Filed: 05/17/16 1217  Date of Service: 05/17/16 1201  Status: Signed          Editor: Regenia Skeeter, MD (Physician)            Consult Orders        1. IP CONSULT TO GASTROENTEROLOGY [[id number]] ordered by Amelia Jo, MD at 05/15/16 1609                                   GI Consultation Note [name] for [place])      NAME: David [name]  DOB: 1970/12/11 MRN:  [id number]    ATTG: '@ATTPHYS' @ PCP:  None   Date/Time:   05/17/2016 12:05 PM     Subjective:     REASON FOR CONSULT:    AP/n/diarrhea/h/o C diff      Mani is a 46 y.o.   male who I was asked to see for above. Please note pt has a h/o non-compliance and lives in [place] where he often goes to [facility] but more recently has been going to [facility], both [facility] and [facility]. Has a h/o recurrent C diff but C diff this admission was negative. Reports n/v/AP x1 week along with ascites.            Past Medical History:        Diagnosis  Date         ?  Adverse effect of anesthesia            sleep apnea uses oxygen at night          ?  Ascites       ?  Asthma       ?  C. difficile colitis       ?  CKD (chronic kidney disease) stage V requiring chronic dialysis ([place])       ?  HTN (hypertension)           ?  SBP (spontaneous bacterial peritonitis) [facility])             Past Surgical History:         Procedure  Laterality  Date          ?  COLONOSCOPY  N/A  03/04/2016          COLONOSCOPY performed by Macie Burows, MD at [facility]          ?  HX HERNIA REPAIR         ?  SIGMOIDOSCOPY,DIAGNOSTIC    03/04/2016                     Social History       Substance Use Topics         ?  Smoking status:  Heavy Tobacco Smoker              Packs/day:  0.50         Years:  10.00         ?  Smokeless tobacco:  Never Used         ?  Alcohol use  No  Family History          Problem  Relation  Age of Onset          ?  Hypertension  Mother             Allergies        Allergen  Reactions         ?  Lisinopril  Shortness of Breath     ?  Morphine  Shortness of Breath         ?  Toradol [Ketorolac]  Shortness of Breath         Home Medications:     Prior to Admission Medications     Prescriptions  Last Dose  Informant  Patient Reported?  Taking?      albuterol (PROVENTIL HFA, VENTOLIN HFA, PROAIR HFA) 90 mcg/actuation inhaler  Unknown at Unknown time    Yes  No      Sig: Take 2 Puffs by inhalation every four (4) hours as needed for Wheezing.      amLODIPine (NORVASC) 10 mg tablet  Unknown at Unknown time    Yes  No      Sig: Take 10 mg by mouth daily.      carvedilol (COREG) 12.5 mg tablet  Unknown at Unknown time    Yes  No      Sig: Take  by mouth two (2) times daily (with meals).      furosemide (LASIX) 80 mg tablet  Unknown at Unknown time    Yes  No      Sig: Take 80 mg by mouth daily.      hydrALAZINE (APRESOLINE) 100 mg tablet  Unknown at Unknown time    Yes  No      Sig: Take 100 mg by mouth three (3) times daily.      pantoprazole (PROTONIX) 40 mg tablet  Unknown at Unknown time    Yes  No      Sig: Take 40 mg by mouth daily.      sevelamer (RENAGEL) 400 mg tablet  Unknown at Unknown time    Yes  No      Sig: Take 800 mg by mouth three (3) times daily (with meals).               Facility-Administered Medications: None        Hospital medications:     Current Facility-Administered Medications          Medication  Dose  Route  Frequency           ?  albuterol (PROVENTIL HFA, VENTOLIN HFA, PROAIR HFA) inhaler 2 Puff   2 Puff  Inhalation  Q8H PRN     ?  HYDROmorphone (DILAUDID) injection 1 mg   1 mg  IntraVENous  Q4H PRN     ?  amLODIPine (NORVASC) tablet 10 mg   10 mg  Oral  DAILY     ?  carvedilol (COREG) tablet 12.5 mg   12.5 mg  Oral  BID WITH MEALS     ?  furosemide (LASIX) tablet 80 mg   80 mg  Oral  DAILY     ?  hydrALAZINE (APRESOLINE) tablet 100 mg   100 mg  Oral  TID     ?   sevelamer carbonate (RENVELA) tab 800 mg   800 mg  Oral  TID WITH MEALS           ?  albuterol-ipratropium (DUO-NEB)  2.5 MG-0.5 MG/3 ML   3 mL  Nebulization  Q6H PRN           ?  acetaminophen (TYLENOL) tablet 650 mg   650 mg  Oral  Q4H PRN     ?  ondansetron (ZOFRAN) injection 4 mg   4 mg  IntraVENous  Q4H PRN     ?  oxyCODONE IR (ROXICODONE) tablet 5 mg   5 mg  Oral  Q4H PRN     ?  heparin (porcine) injection 5,000 Units   5,000 Units  SubCUTAneous  Q12H     ?  labetalol (NORMODYNE;TRANDATE) injection 10 mg   10 mg  IntraVENous  Q6H PRN     ?  sodium chloride (NS) flush 5-10 mL   5-10 mL  IntraVENous  Q8H     ?  sodium chloride (NS) flush 5-10 mL   5-10 mL  IntraVENous  PRN     ?  insulin lispro (HUMALOG) injection     SubCUTAneous  AC&HS     ?  glucose chewable tablet 16 g   4 Tab  Oral  PRN     ?  dextrose (D50W) injection syrg 12.5-25 g   12.5-25 g  IntraVENous  PRN     ?  glucagon (GLUCAGEN) injection 1 mg   1 mg  IntraMUSCular  PRN     ?  lactobac ac& pc-s.therm-b.anim (FLORA Q/RISAQUAD)   1 Cap  Oral  DAILY           ?  epoetin alfa (EPOGEN;PROCRIT) injection 8,000 Units   8,000 Units  SubCUTAneous  Q MON, WED & FRI        REVIEW OF SYSTEMS:      '[]'        Unable to obtain  ROS due to  '[]'      mental status change  '[]'      sedated   '[]'       intubated    '[x]'       Total of 11 systems reviewed as follows:   Const:  negative fever, negative chills, negative weight loss   Eyes:   negative diplopia or visual changes, negative eye pain   ENT:   negative coryza, negative sore throat   Resp:   negative cough, hemoptysis, dyspnea   Cards:  negative for chest pain, palpitations, lower extremity edema   GU:  negative for frequency, dysuria and hematuria   Skin:   negative for rash and pruritus   Heme:  negative for easy bruising and gum/nose bleeding   MS:  negative for myalgias, arthralgias, back pain and muscle weakness   Neurolo:  negative for headaches, dizziness, vertigo, memory problems    Psych:  negative for  feelings of anxiety, depression       Pertinent Positives include :        Objective:     VITALS:       Visit Vitals         ?  BP  (!) 185/112 (BP 1 Location: Right arm, BP Patient Position: At rest)     ?  Pulse  96     ?  Temp  98.3 ??F (36.8 ??C)     ?  Resp  20     ?  Ht  '6\' 1"'  (1.854 m)     ?  Wt  100.2 kg (220 lb 14.4 oz)     ?  SpO2  97%         ?  BMI  29.14 kg/m2        Temp (24hrs), Avg:98.5 ??F (36.9 ??C), Min:98.2 ??F (36.8 ??C), Max:98.9 ??F (37.2 ??C)      PHYSICAL EXAM:    General:    Alert, cooperative, no distress, appears stated age.      Head:   Normocephalic, without obvious abnormality, atraumatic.   Eyes:   Conjunctivae clear, anicteric sclerae.  Pupils are equal   Nose:  Nares normal. No drainage or sinus tenderness.   Throat:    Lips, mucosa, and tongue normal.  No Thrush   Neck:  Supple, symmetrical,  no adenopathy, thyroid: non tender   Back:    Symmetric,  No CVA tenderness.   Lungs:   CTA bilaterally.  No wheezing/rhonchi/rales.   Chest wall:  No tenderness or deformity. No Accessory muscle use.   Heart:   Regular rate and rhythm,  no murmur, rub or gallop.   Abdomen:   Soft, +diffusely TTP w/o peritoneal signs, +shiftin dullness. Not distended.  Bowel sounds normal. No masses   Extremities: Atraumatic, No cyanosis.  No edema. No clubbing   Skin:     Texture, turgor normal. No rashes/lesions/jaundice   Lymph: Cervical, supraclavicular normal.   Psych:  Good insight.  Not depressed.  Not anxious or agitated.   Neurologic: EOMs intact. No facial asymmetry. No aphasia or slurred speech. Normal  strength, A/O X 3.       LAB DATA REVIEWED:       Recent Results (from the past 48 hour(s))     C. DIFFICILE (DNA)          Collection Time: 05/15/16 12:25 PM         Result  Value  Ref Range            C. difficile (DNA)  NEGATIVE   NEG         CULTURE, STOOL          Collection Time: 05/15/16 12:25 PM         Result  Value  Ref Range            Special Requests:  NO SPECIAL REQUESTS          Campylobacter  antigen  NEGATIVE          Shiga toxin-producing E. coli Ag  NEGATIVE          Culture result:                  NO ROUTINE ENTERIC PATHOGENS ISOLATED INCLUDING SALMONELLA, SHIGELLA, YERSINIA, VIBRIO OR SHIGA TOXIN PRODUCING E. COLI       CBC WITH AUTOMATED DIFF          Collection Time: 05/15/16  1:11 PM         Result  Value  Ref Range            WBC  5.9  4.1 - 11.1 K/uL       RBC  2.82 (L)  4.10 - 5.70 M/uL       HGB  8.6 (L)  12.1 - 17.0 g/dL       HCT  27.2 (L)  36.6 - 50.3 %       MCV  96.5  80.0 - 99.0 FL       MCH  30.5  26.0 - 34.0 PG       MCHC  31.6  30.0 - 36.5 g/dL       RDW  16.5 (  H)  11.5 - 14.5 %       PLATELET  133 (L)  150 - 400 K/uL       MPV  10.7  8.9 - 12.9 FL       NRBC  0.0  0 PER 100 WBC       ABSOLUTE NRBC  0.00  0.00 - 0.01 K/uL       NEUTROPHILS  73  32 - 75 %       LYMPHOCYTES  9 (L)  12 - 49 %       MONOCYTES  8  5 - 13 %       EOSINOPHILS  9 (H)  0 - 7 %       BASOPHILS  1  0 - 1 %       IMMATURE GRANULOCYTES  0  0.0 - 0.5 %       ABS. NEUTROPHILS  4.3  1.8 - 8.0 K/UL       ABS. LYMPHOCYTES  0.5 (L)  0.8 - 3.5 K/UL       ABS. MONOCYTES  0.5  0.0 - 1.0 K/UL       ABS. EOSINOPHILS  0.5 (H)  0.0 - 0.4 K/UL       ABS. BASOPHILS  0.1  0.0 - 0.1 K/UL       ABS. IMM. GRANS.  0.0  0.00 - 0.04 K/UL       DF  AUTOMATED          RBC COMMENTS  ANISOCYTOSIS   1+             RBC COMMENTS  POLYCHROMASIA   PRESENT             LIPASE          Collection Time: 05/15/16  1:11 PM         Result  Value  Ref Range            Lipase  189  73 - 393 U/L       MAGNESIUM          Collection Time: 05/15/16  1:11 PM         Result  Value  Ref Range            Magnesium  2.4  1.6 - 2.4 mg/dL       PHOSPHORUS          Collection Time: 05/15/16  1:11 PM         Result  Value  Ref Range            Phosphorus  4.5  2.6 - 4.7 MG/DL       CULTURE, BLOOD, PAIRED          Collection Time: 05/15/16  1:11 PM         Result  Value  Ref Range            Special Requests:  NO SPECIAL REQUESTS          Culture result:  NO GROWTH  2 DAYS          LACTIC ACID          Collection Time: 05/15/16  1:11 PM         Result  Value  Ref Range            Lactic acid  1.1  0.4 - 2.0 MMOL/L       METABOLIC PANEL, COMPREHENSIVE  Collection Time: 05/15/16  1:11 PM         Result  Value  Ref Range            Sodium  137  136 - 145 mmol/L       Potassium  5.6 (H)  3.5 - 5.1 mmol/L       Chloride  100  97 - 108 mmol/L       CO2  27  21 - 32 mmol/L       Anion gap  10  5 - 15 mmol/L       Glucose  84  65 - 100 mg/dL       BUN  42 (H)  6 - 20 MG/DL            Creatinine  7.54 (H)  0.70 - 1.30 MG/DL            BUN/Creatinine ratio  6 (L)  12 - 20         GFR est AA  10 (L)  >60 ml/min/1.90m       GFR est non-AA  8 (L)  >60 ml/min/1.753m      Calcium  8.1 (L)  8.5 - 10.1 MG/DL       Bilirubin, total  0.5  0.2 - 1.0 MG/DL       ALT (SGPT)  13  12 - 78 U/L       AST (SGOT)  24  15 - 37 U/L       Alk. phosphatase  292 (H)  45 - 117 U/L       Protein, total  6.9  6.4 - 8.2 g/dL       Albumin  3.0 (L)  3.5 - 5.0 g/dL       Globulin  3.9  2.0 - 4.0 g/dL       A-G Ratio  0.8 (L)  1.1 - 2.2         CELL COUNT, BODY FLUID          Collection Time: 05/15/16  3:52 PM         Result  Value  Ref Range            BODY FLUID TYPE  PERITONEAL FLUID          FLUID COLOR  STRAW          FLUID APPEARANCE  CLOUDY          FLUID RBC CT.  >100 (H)  0 /cu mm       FLUID NUCLEATED CELLS  113  /cu mm       FLD NEUTROPHILS  2 (A)  NRRE %       FLD LYMPHS  14 (A)  NRRE %       FLD MONO/MACROPHAGES  1 (A)  NRRE %       FLUID MESOTHELIAL  83 (A)  NRRE %       CULTURE, BODY FLUID W Sid FalconTAIN          Collection Time: 05/15/16  4:02 PM         Result  Value  Ref Range            Special Requests:  NO SPECIAL REQUESTS          GRAM STAIN  NO WBC'S SEEN          GRAM STAIN  NO ORGANISMS SEEN  GRAM STAIN  Culture performed on Fluid swab specimen          Culture result:  NO GROWTH AFTER 14 HOURS          EKG, 12 LEAD, INITIAL          Collection Time: 05/15/16  4:06 PM          Result  Value  Ref Range            Ventricular Rate  93  BPM       Atrial Rate  93  BPM       P-R Interval  184  ms       QRS Duration  84  ms       Q-T Interval  406  ms       QTC Calculation (Bezet)  504  ms       Calculated P Axis  54  degrees       Calculated R Axis  -16  degrees       Calculated T Axis  68  degrees       Diagnosis                 Normal sinus rhythm   Possible Left atrial enlargement   Prolonged QT   Abnormal ECG   When compared with ECG of 08-Mar-2016 07:21,   No significant change was found   Confirmed by Dennie Bible [phone]) on 05/16/2016 5:02:06 PM          PROTHROMBIN TIME + INR          Collection Time: 05/15/16  5:05 PM         Result  Value  Ref Range            INR  1.1  0.9 - 1.1         Prothrombin time  11.2 (H)  9.0 - 11.1 sec       CBC WITH AUTOMATED DIFF          Collection Time: 05/16/16  2:01 AM         Result  Value  Ref Range            WBC  5.2  4.1 - 11.1 K/uL       RBC  2.78 (L)  4.10 - 5.70 M/uL       HGB  8.4 (L)  12.1 - 17.0 g/dL       HCT  26.8 (L)  36.6 - 50.3 %       MCV  96.4  80.0 - 99.0 FL       MCH  30.2  26.0 - 34.0 PG       MCHC  31.3  30.0 - 36.5 g/dL       RDW  16.1 (H)  11.5 - 14.5 %       PLATELET  123 (L)  150 - 400 K/uL       MPV  10.5  8.9 - 12.9 FL       NRBC  0.0  0 PER 100 WBC       ABSOLUTE NRBC  0.00  0.00 - 0.01 K/uL       NEUTROPHILS  70  32 - 75 %       LYMPHOCYTES  9 (L)  12 - 49 %       MONOCYTES  9  5 - 13 %       EOSINOPHILS  11 (H)  0 - 7 %       BASOPHILS  1  0 - 1 %       IMMATURE GRANULOCYTES  0  0.0 - 0.5 %       ABS. NEUTROPHILS  3.6  1.8 - 8.0 K/UL       ABS. LYMPHOCYTES  0.5 (L)  0.8 - 3.5 K/UL       ABS. MONOCYTES  0.5  0.0 - 1.0 K/UL       ABS. EOSINOPHILS  0.6 (H)  0.0 - 0.4 K/UL       ABS. BASOPHILS  0.0  0.0 - 0.1 K/UL       ABS. IMM. GRANS.  0.0  0.00 - 0.04 K/UL       DF  AUTOMATED          MAGNESIUM          Collection Time: 05/16/16  2:01 AM         Result  Value  Ref Range            Magnesium  2.2  1.6 - 2.4 mg/dL        METABOLIC PANEL, COMPREHENSIVE          Collection Time: 05/16/16  2:01 AM         Result  Value  Ref Range            Sodium  141  136 - 145 mmol/L       Potassium  3.5  3.5 - 5.1 mmol/L       Chloride  103  97 - 108 mmol/L       CO2  30  21 - 32 mmol/L       Anion gap  8  5 - 15 mmol/L       Glucose  95  65 - 100 mg/dL       BUN  21 (H)  6 - 20 MG/DL       Creatinine  4.19 (H)  0.70 - 1.30 MG/DL       BUN/Creatinine ratio  5 (L)  12 - 20         GFR est AA  19 (L)  >60 ml/min/1.74m       GFR est non-AA  15 (L)  >60 ml/min/1.771m      Calcium  8.0 (L)  8.5 - 10.1 MG/DL       Bilirubin, total  0.5  0.2 - 1.0 MG/DL       ALT (SGPT)  13  12 - 78 U/L       AST (SGOT)  20  15 - 37 U/L       Alk. phosphatase  273 (H)  45 - 117 U/L       Protein, total  6.6  6.4 - 8.2 g/dL       Albumin  2.9 (L)  3.5 - 5.0 g/dL       Globulin  3.7  2.0 - 4.0 g/dL       A-G Ratio  0.8 (L)  1.1 - 2.2         PHOSPHORUS          Collection Time: 05/16/16  2:01 AM         Result  Value  Ref Range            Phosphorus  2.6  2.6 - 4.7 MG/DL  TSH 3RD GENERATION          Collection Time: 05/16/16  2:01 AM         Result  Value  Ref Range            TSH  1.90  0.36 - 3.74 uIU/mL       WBC, STOOL          Collection Time: 05/16/16 11:43 AM         Result  Value  Ref Range            White blood cells, stool  0 TO 4  0 - 4 /HPF       CBC W/O DIFF          Collection Time: 05/17/16  7:30 AM         Result  Value  Ref Range            WBC  5.2  4.1 - 11.1 K/uL       RBC  2.51 (L)  4.10 - 5.70 M/uL       HGB  7.8 (L)  12.1 - 17.0 g/dL       HCT  24.9 (L)  36.6 - 50.3 %       MCV  99.2 (H)  80.0 - 99.0 FL       MCH  31.1  26.0 - 34.0 PG       MCHC  31.3  30.0 - 36.5 g/dL       RDW  16.1 (H)  11.5 - 14.5 %       PLATELET  139 (L)  150 - 400 K/uL       MPV  10.4  8.9 - 12.9 FL       NRBC  0.0  0 PER 100 WBC       ABSOLUTE NRBC  0.00  0.00 - 0.01 K/uL       METABOLIC PANEL, BASIC          Collection Time: 05/17/16  7:30 AM         Result  Value   Ref Range            Sodium  141  136 - 145 mmol/L       Potassium  4.6  3.5 - 5.1 mmol/L       Chloride  103  97 - 108 mmol/L       CO2  30  21 - 32 mmol/L       Anion gap  8  5 - 15 mmol/L       Glucose  73  65 - 100 mg/dL       BUN  30 (H)  6 - 20 MG/DL       Creatinine  7.01 (H)  0.70 - 1.30 MG/DL       BUN/Creatinine ratio  4 (L)  12 - 20         GFR est AA  10 (L)  >60 ml/min/1.17m       GFR est non-AA  9 (L)  >60 ml/min/1.782m           Calcium  7.5 (L)  8.5 - 10.1 MG/DL        IMAGING RESULTS:    '[x]'        I have personally reviewed the actual   '[]'      CXR   '[x]'       CT  '[]'   [place]        Assessment/Plan:         Active Problems:     Peritonitis ([place]) (11/21/2015)        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 difficile colitis (05/15/2016)        Hyperkalemia (05/15/2016)          1. N/v/AP/diarrhea (though apparently no n/v/diarrhea witnessed by Nursing)   2. H/o recurrent C diff with negative C diff on this admission   3. Recurrent ascites NOT 2/2 portal hypertension (low SAAG ascites and prior liver bx in [name] per pt normal)   4. Diagnostic paracentesis during this hospitalization negative for SBP   5. H/o non-compliance   ___________________________________________________   RECOMMENDATIONS:      - agree with plan for LVP as this likely will improve pt's symptoms   - will defer to Hospitalist/Nephrologist for management of ascites as this is not 2/2 portal hypertension   - ID consult on 11/15 made recommendations of what studies to send on repeat paracentesis fluid   - possible EGD pending response to LVP         Discussed Code Status:    '[]'      Full Code      '[]'      DNR     ___________________________________________________   Care Plan discussed with:     '[x]'       Patient   '[]'      Family   '[]'      Nursing   '[]'      Attending        ___________________________________________________   GI: Regenia Skeeter, MD

## 2016-05-19 LAB — CULTURE, BODY FLUID W GRAM STAIN
Culture result:: NO GROWTH
GRAM STAIN: NONE SEEN
GRAM STAIN: NONE SEEN

## 2016-05-19 LAB — HEP B SURFACE AG
Hep B surface Ag Interp.: NEGATIVE
Hepatitis B surface Ag: 0.17 Index

## 2016-05-20 ENCOUNTER — Emergency Department: Payer: Medicare Other

## 2016-05-20 ENCOUNTER — Emergency Department
Admission: EM | Admit: 2016-05-20 | Discharge: 2016-05-20 | Disposition: A | Discharge status: Home / Self Care | Payer: Medicare Other | Attending: Emergency Medicine | Admitting: Emergency Medicine

## 2016-05-20 ENCOUNTER — Encounter: Payer: Self-pay | Admitting: Internal Medicine

## 2016-05-20 DIAGNOSIS — I3139 Other pericardial effusion (noninflammatory): Secondary | ICD-10-CM

## 2016-05-20 DIAGNOSIS — N184 Chronic kidney disease, stage 4 (severe): Secondary | ICD-10-CM | POA: Insufficient documentation

## 2016-05-20 DIAGNOSIS — N179 Acute kidney failure, unspecified: Secondary | ICD-10-CM | POA: Insufficient documentation

## 2016-05-20 DIAGNOSIS — R197 Diarrhea, unspecified: Secondary | ICD-10-CM | POA: Insufficient documentation

## 2016-05-20 DIAGNOSIS — I313 Pericardial effusion (noninflammatory): Secondary | ICD-10-CM

## 2016-05-20 DIAGNOSIS — R0789 Other chest pain: Secondary | ICD-10-CM

## 2016-05-20 DIAGNOSIS — R079 Chest pain, unspecified: Secondary | ICD-10-CM

## 2016-05-20 DIAGNOSIS — D649 Anemia, unspecified: Secondary | ICD-10-CM | POA: Insufficient documentation

## 2016-05-20 DIAGNOSIS — J810 Acute pulmonary edema: Secondary | ICD-10-CM

## 2016-05-20 DIAGNOSIS — R188 Other ascites: Secondary | ICD-10-CM | POA: Insufficient documentation

## 2016-05-20 DIAGNOSIS — I129 Hypertensive chronic kidney disease with stage 1 through stage 4 chronic kidney disease, or unspecified chronic kidney disease: Secondary | ICD-10-CM | POA: Insufficient documentation

## 2016-05-20 LAB — CULTURE, BLOOD, PAIRED: Culture result:: NO GROWTH

## 2016-05-20 LAB — ECG 12-LEAD
P Wave Axis: 72 deg
P-R Interval: 188 ms
Patient Age: 45 years
Q-T Interval(Corrected): 457 ms
Q-T Interval: 344 ms
QRS Axis: 23 deg
QRS Duration: 101 ms
T Axis: 96 years
Ventricular Rate: 106 //min

## 2016-05-20 LAB — VH TROPONIN I CONFIRMATION: Troponin I: 0.08 ng/mL — ABNORMAL HIGH (ref 0.00–0.02)

## 2016-05-20 LAB — CBC AND DIFFERENTIAL
Basophils %: 0.2 % (ref 0.0–3.0)
Basophils Absolute: 0 10*3/uL (ref 0.0–0.3)
Eosinophils %: 12.3 % — ABNORMAL HIGH (ref 0.0–7.0)
Eosinophils Absolute: 0.6 10*3/uL (ref 0.0–0.8)
Hematocrit: 25.5 % — ABNORMAL LOW (ref 39.0–52.5)
Hemoglobin: 8.5 gm/dL — ABNORMAL LOW (ref 13.0–17.5)
Lymphocytes Absolute: 0.7 10*3/uL (ref 0.6–5.1)
Lymphocytes: 13 % — ABNORMAL LOW (ref 15.0–46.0)
MCH: 32 pg (ref 28–35)
MCHC: 33 gm/dL (ref 32–36)
MCV: 96 fL (ref 80–100)
MPV: 7.4 fL (ref 6.0–10.0)
Monocytes Absolute: 0.3 10*3/uL (ref 0.1–1.7)
Monocytes: 6.2 % (ref 3.0–15.0)
Neutrophils %: 68.3 % (ref 42.0–78.0)
Neutrophils Absolute: 3.6 10*3/uL (ref 1.7–8.6)
PLT CT: 132 10*3/uL (ref 130–440)
RBC: 2.66 10*6/uL — ABNORMAL LOW (ref 4.00–5.70)
RDW: 14.6 % — ABNORMAL HIGH (ref 11.0–14.0)
WBC: 5.2 10*3/uL (ref 4.0–11.0)

## 2016-05-20 LAB — HEPATIC FUNCTION PANEL
ALT: 10 U/L (ref 0–55)
AST (SGOT): 15 U/L (ref 10–42)
Albumin/Globulin Ratio: 0.88 Ratio (ref 0.70–1.50)
Albumin: 2.8 gm/dL — ABNORMAL LOW (ref 3.5–5.0)
Alkaline Phosphatase: 246 U/L — ABNORMAL HIGH (ref 40–145)
Bilirubin Direct: 0.2 mg/dL (ref 0.0–0.3)
Bilirubin, Total: 0.5 mg/dL (ref 0.1–1.2)
Globulin: 3.2 gm/dL (ref 2.0–4.0)
Protein, Total: 6 gm/dL (ref 6.0–8.3)

## 2016-05-20 LAB — I-STAT CHEM 8 CARTRIDGE
Anion Gap I-Stat: 18 — ABNORMAL HIGH (ref 7–16)
BUN I-Stat: 32 mg/dL — ABNORMAL HIGH (ref 6–20)
Calcium Ionized I-Stat: 4.1 mg/dL — ABNORMAL LOW (ref 4.35–5.10)
Chloride I-Stat: 102 mMol/L (ref 98–112)
Creatinine I-Stat: 8 mg/dL — ABNORMAL HIGH (ref 0.90–1.30)
EGFR: 8 mL/min/{1.73_m2} — ABNORMAL LOW (ref 60–150)
Glucose I-Stat: 105 mg/dL — ABNORMAL HIGH (ref 70–99)
Hematocrit I-Stat: 22 % — ABNORMAL LOW (ref 39.0–52.5)
Hemoglobin I-Stat: 7.5 gm/dL — ABNORMAL LOW (ref 13.0–17.5)
Potassium I-Stat: 4.8 mMol/L (ref 3.5–5.3)
Sodium I-Stat: 140 mMol/L (ref 135–145)
TCO2 I-Stat: 27 mMol/L (ref 24–29)

## 2016-05-20 LAB — VH I-STAT CHEM 8 NOTIFICATION

## 2016-05-20 LAB — MAGNESIUM: Magnesium: 2 mg/dL (ref 1.6–2.6)

## 2016-05-20 LAB — VH C. DIFFICILE TOXIN B GENE BY DNA AMPLIFICATION: Stool Clostridium difficile Toxin B Gene DNA Amplification: NEGATIVE

## 2016-05-20 LAB — I-STAT TROPONIN: Troponin I I-Stat: 0.04 ng/mL — ABNORMAL HIGH (ref 0.00–0.02)

## 2016-05-20 LAB — VH I-STAT TROPONIN NOTIFICATION

## 2016-05-20 LAB — LIPASE: Lipase: 26 U/L (ref 8–78)

## 2016-05-20 MED ORDER — SODIUM CHLORIDE 0.9 % IV BOLUS
500.0000 mL | Freq: Once | INTRAVENOUS | Status: AC
Start: 2016-05-20 — End: 2016-05-20
  Administered 2016-05-20: 500 mL via INTRAVENOUS

## 2016-05-20 MED ORDER — HYDROMORPHONE HCL 2 MG/ML IJ SOLN
INTRAMUSCULAR | Status: AC
Start: 2016-05-20 — End: ?
  Filled 2016-05-20: qty 1

## 2016-05-20 MED ORDER — ASPIRIN 81 MG PO CHEW
CHEWABLE_TABLET | ORAL | Status: AC
Start: 2016-05-20 — End: ?
  Filled 2016-05-20: qty 4

## 2016-05-20 MED ORDER — NITROGLYCERIN 2 % TD OINT
1.0000 [in_us] | TOPICAL_OINTMENT | Freq: Once | TRANSDERMAL | Status: AC
Start: 2016-05-20 — End: 2016-05-20
  Administered 2016-05-20: 1 [in_us] via TOPICAL

## 2016-05-20 MED ORDER — HYDRALAZINE HCL 20 MG/ML IJ SOLN
10.0000 mg | Freq: Four times a day (QID) | INTRAMUSCULAR | Status: DC | PRN
Start: 2016-05-20 — End: 2016-05-20

## 2016-05-20 MED ORDER — ASPIRIN 81 MG PO CHEW
324.0000 mg | CHEWABLE_TABLET | Freq: Once | ORAL | Status: AC
Start: 2016-05-20 — End: 2016-05-20
  Administered 2016-05-20: 324 mg via ORAL

## 2016-05-20 MED ORDER — ONDANSETRON HCL 4 MG/2ML IJ SOLN
4.0000 mg | Freq: Once | INTRAMUSCULAR | Status: AC
Start: 2016-05-20 — End: 2016-05-20
  Administered 2016-05-20: 4 mg via INTRAVENOUS

## 2016-05-20 MED ORDER — ONDANSETRON HCL 4 MG/2ML IJ SOLN
INTRAMUSCULAR | Status: AC
Start: 2016-05-20 — End: ?
  Filled 2016-05-20: qty 2

## 2016-05-20 MED ORDER — ONDANSETRON HCL 4 MG/2ML IJ SOLN
4.0000 mg | Freq: Four times a day (QID) | INTRAMUSCULAR | Status: DC | PRN
Start: 2016-05-20 — End: 2016-05-20

## 2016-05-20 MED ORDER — HYDROMORPHONE HCL 2 MG/ML IJ SOLN
1.0000 mg | Freq: Once | INTRAMUSCULAR | Status: AC
Start: 2016-05-20 — End: 2016-05-20
  Administered 2016-05-20: 1 mg via INTRAVENOUS

## 2016-05-20 MED ORDER — NITROGLYCERIN 2 % TD OINT
TOPICAL_OINTMENT | TRANSDERMAL | Status: AC
Start: 2016-05-20 — End: ?
  Filled 2016-05-20: qty 1

## 2016-05-20 NOTE — Progress Notes (Signed)
Dr Barnet Pall at bedside to see patient; pt requested dilaudid, which was not ordered. Pt called for nurse to state that he wanted to go home and not be admitted.  Dr Reece Levy & Dr. Wende Mott aware; pt signed AMA paperwork and left on foot.

## 2016-05-20 NOTE — ED Provider Notes (Signed)
Physician/Midlevel provider first contact with patient: 05/20/16 1610         History     Chief Complaint   Patient presents with   . Diarrhea     HPI   Patient's had vomiting and diarrhea for the past 3 days. He is right-sided abdominal pain. He has a history of C. Difficile. He developed subsequent sternal and left chest pain this morning. He describes the pain as pressure in his chest. He has a history of an MI. He had similar pain at that time. He had a fever yesterday. Today with the chest pain he had shortness of breath and diaphoresis. He has not been wheezing. He has no dysuria. No sore throat or headache.    His last dialysis was 6 days ago in [place].    Past Medical History:   Diagnosis Date   . Asthma without status asthmaticus    . 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 Clostridium difficile diarrhea    . Drug-seeking behavior    . Hypertension    . Kidney failure    . Myocardial infarction     2002       Past Surgical History:   Procedure Laterality Date   . AV FISTULA PLACEMENT     . HERNIA REPAIR     . PERITONEAL CATHETER INSERTION         Family History   Problem Relation Age of Onset   . Hypertension Mother        Social  Social History   Substance Use Topics   . Smoking status: Current Every Day Smoker     Packs/day: 0.25     Years: 10.00     Types: Cigarettes     Last attempt to quit: 09/18/2015   . Smokeless tobacco: Former Neurosurgeon      Comment: declines   . Alcohol use No       .     Allergies   Allergen Reactions   . Lisinopril Anaphylaxis   . Toradol [Ketorolac Tromethamine] Anaphylaxis   . Morphine Other (See Comments)     "jittery" per patient, "I don't like the way it makes me feel". Previously charted anaphylaxis, but patient said just "jumpy, jittery"   . Percocet [Oxycodone-Acetaminophen] Other (See Comments)     "jittery" per patient, "I don't like the way it makes me feel"       Home Medications             acetaminophen (TYLENOL) 325 MG tablet     Take 2 tablets (650 mg total)  by mouth every 4 (four) hours as needed for Pain.     albuterol (PROVENTIL) (2.5 MG/3ML) 0.083% nebulizer solution     Take 2.5 mg by nebulization 2 (two) times daily as needed.         albuterol-ipratropium (COMBIVENT RESPIMAT) 20-100 MCG/ACT Aero Soln     Inhale 2 puffs into the lungs daily.         amLODIPine (NORVASC) 10 MG tablet     Take 10 mg by mouth daily.         carvedilol (COREG) 25 MG tablet     Take 25 mg by mouth 2 (two) times daily.     furosemide (LASIX) 80 MG tablet     Take 80 mg by mouth daily.     hydrALAZINE (APRESOLINE) 100 MG tablet     Take 100 mg by mouth 3 (three) times daily.  sevelamer (RENVELA) 800 MG tablet     Take 800 mg by mouth 3 (three) times daily with meals.           Review of Systems   Constitutional: Positive for fever.   HENT: Negative for sore throat.    Eyes: Negative for visual disturbance.   Respiratory: Positive for chest tightness and shortness of breath.    Cardiovascular: Positive for chest pain.   Gastrointestinal: Positive for abdominal pain, diarrhea and vomiting.   Genitourinary: Negative for dysuria.   Musculoskeletal: Negative for back pain.   Skin: Negative for rash.   Neurological: Negative for headaches.   Psychiatric/Behavioral: Negative for confusion.       Physical Exam    BP: (!) 186/101, Heart Rate: (!) 118, Temp: 98.2 F (36.8 C), Resp Rate: 18, SpO2: 99 %, Weight: 99.5 kg    Physical Exam   Constitutional: He is oriented to person, place, and time. He appears well-developed and well-nourished. He appears distressed.   HENT:   Head: Normocephalic.   Mouth/Throat: Oropharynx is clear and moist.   Eyes: Conjunctivae are normal. Pupils are equal, round, and reactive to light.   Neck: Normal range of motion. Neck supple.   Cardiovascular: Regular rhythm and normal heart sounds.    tachycardic   Pulmonary/Chest: Effort normal and breath sounds normal. No respiratory distress. He has no wheezes.   Abdominal: Soft. There is tenderness. There is guarding.    Tender in the right side of the abdomen with guarding   Musculoskeletal: Normal range of motion. He exhibits no edema.   Neurological: He is alert and oriented to person, place, and time.   Skin: Skin is warm and dry. He is not diaphoretic.   Psychiatric: He has a normal mood and affect. His behavior is normal.   Nursing note and vitals reviewed.        MDM and ED Course     ED Medication Orders     Start Ordered     Status Ordering Provider    05/20/16 1133 05/20/16 1132  HYDROmorphone (DILAUDID) injection 1 mg  Once in ED     Route: Intravenous  Ordered Dose: 1 mg     Last MAR action:  Given Meriem Lemieux J    05/20/16 1133 05/20/16 1132  ondansetron (ZOFRAN) injection 4 mg  Once in ED     Route: Intravenous  Ordered Dose: 4 mg     Last MAR action:  Given Shoua Ulloa J    05/20/16 1132 05/20/16 1131  nitroglycerin (NITRO-BID) 2 % ointment 1 inch  Once in ED     Route: Topical  Ordered Dose: 1 inch     Last MAR action:  Given Malkie Wille J    05/20/16 1132 05/20/16 1131  sodium chloride 0.9 % bolus 500 mL  Once in ED     Route: Intravenous  Ordered Dose: 500 mL     Last MAR action:  Stopped Jahred Tatar J    05/20/16 1038 05/20/16 1037  ondansetron (ZOFRAN) injection 4 mg  Once in ED     Route: Intravenous  Ordered Dose: 4 mg     Last MAR action:  Given TERZIAN, BRIAN    05/20/16 1004 05/20/16 1003  aspirin chewable tablet 324 mg  Once in ED     Route: Oral  Ordered Dose: 324 mg     Last MAR action:  Given Boykin Peek         MDM  Differential diagnosis includes angina, MI, C. Difficile diarrhea, dehydration, colitis  Patient is on dialysis. He missed his last dialysis.      ED Course              Critical Care  Performed by: Shaylon Aden J  Authorized by: Donalynn Furlong     Critical care provider statement:     Critical care time (minutes):  40    Critical care was necessary to treat or prevent imminent or life-threatening deterioration of the following conditions:  Renal  failure and dehydration    Critical care was time spent personally by me on the following activities:  Development of treatment plan with patient or surrogate, discussions with consultants, evaluation of patient's response to treatment, examination of patient, obtaining history from patient or surrogate, ordering and performing treatments and interventions, ordering and review of laboratory studies, ordering and review of radiographic studies, pulse oximetry, re-evaluation of patient's condition and review of old charts        Clinical Impression & Disposition     Clinical Impression  Final diagnoses:   Chest pain, unspecified type   Diarrhea, unspecified type   Acute renal failure superimposed on stage 4 chronic kidney disease, unspecified acute renal failure type   Other ascites   Pericardial effusion   Acute pulmonary edema   Anemia, unspecified type        ED Disposition     ED Disposition Condition Date/Time Comment    Admit  Thu May 20, 2016  2:00 PM            New Prescriptions    No medications on file                 Donalynn Furlong, MD  05/20/16 1400

## 2016-05-20 NOTE — ED Notes (Signed)
Report to CDU

## 2016-05-21 ENCOUNTER — Inpatient Hospital Stay
Admit: 2016-05-21 | Discharge: 2016-05-22 | Disposition: A | Discharge status: Other Acute Inpatient Hospital | Payer: MEDICARE | Attending: Emergency Medicine

## 2016-05-21 ENCOUNTER — Emergency Department: Admit: 2016-05-21 | Payer: MEDICARE

## 2016-05-21 DIAGNOSIS — R188 Other ascites: Secondary | ICD-10-CM

## 2016-05-21 DIAGNOSIS — E8770 Fluid overload, unspecified: Principal | ICD-10-CM

## 2016-05-21 LAB — PROTHROMBIN TIME + INR
INR: 1.2 — ABNORMAL HIGH (ref 0.9–1.1)
Prothrombin time: 11.5 s — ABNORMAL HIGH (ref 9.0–11.1)

## 2016-05-21 LAB — METABOLIC PANEL, COMPREHENSIVE
A-G Ratio: 0.8 — ABNORMAL LOW (ref 1.1–2.2)
ALT (SGPT): 12 U/L (ref 12–78)
AST (SGOT): 26 U/L (ref 15–37)
Albumin: 2.8 g/dL — ABNORMAL LOW (ref 3.5–5.0)
Alk. phosphatase: 248 U/L — ABNORMAL HIGH (ref 45–117)
Anion gap: 12 mmol/L (ref 5–15)
BUN/Creatinine ratio: 5 — ABNORMAL LOW (ref 12–20)
BUN: 46 MG/DL — ABNORMAL HIGH (ref 6–20)
Bilirubin, total: 0.4 MG/DL (ref 0.2–1.0)
CO2: 26 mmol/L (ref 21–32)
Calcium: 7.5 MG/DL — ABNORMAL LOW (ref 8.5–10.1)
Chloride: 103 mmol/L (ref 97–108)
Creatinine: 9.48 MG/DL — ABNORMAL HIGH (ref 0.70–1.30)
GFR est AA: 7 mL/min/{1.73_m2} — ABNORMAL LOW (ref >60)
GFR est non-AA: 6 mL/min/{1.73_m2} — ABNORMAL LOW (ref >60)
Globulin: 3.6 g/dL (ref 2.0–4.0)
Glucose: 87 mg/dL (ref 65–100)
Potassium: 5.4 mmol/L — ABNORMAL HIGH (ref 3.5–5.1)
Protein, total: 6.4 g/dL (ref 6.4–8.2)
Sodium: 141 mmol/L (ref 136–145)

## 2016-05-21 LAB — CBC WITH AUTOMATED DIFF
ABS. BASOPHILS: 0 10*3/uL (ref 0.0–0.1)
ABS. EOSINOPHILS: 0.7 10*3/uL — ABNORMAL HIGH (ref 0.0–0.4)
ABS. IMM. GRANS.: 0 10*3/uL (ref 0.00–0.04)
ABS. LYMPHOCYTES: 0.5 10*3/uL — ABNORMAL LOW (ref 0.8–3.5)
ABS. MONOCYTES: 0.4 10*3/uL (ref 0.0–1.0)
ABS. NEUTROPHILS: 4.4 10*3/uL (ref 1.8–8.0)
ABSOLUTE NRBC: 0 10*3/uL (ref 0.00–0.01)
BASOPHILS: 0 % (ref 0–1)
EOSINOPHILS: 11 % — ABNORMAL HIGH (ref 0–7)
HCT: 24.5 % — ABNORMAL LOW (ref 36.6–50.3)
HGB: 7.8 g/dL — ABNORMAL LOW (ref 12.1–17.0)
IMMATURE GRANULOCYTES: 0 % (ref 0.0–0.5)
LYMPHOCYTES: 8 % — ABNORMAL LOW (ref 12–49)
MCH: 30.8 PG (ref 26.0–34.0)
MCHC: 31.8 g/dL (ref 30.0–36.5)
MCV: 96.8 FL (ref 80.0–99.0)
MONOCYTES: 6 % (ref 5–13)
MPV: 9.8 FL (ref 8.9–12.9)
NEUTROPHILS: 75 % (ref 32–75)
NRBC: 0 PER 100 WBC
PLATELET: 120 10*3/uL — ABNORMAL LOW (ref 150–400)
RBC: 2.53 M/uL — ABNORMAL LOW (ref 4.10–5.70)
RDW: 15.9 % — ABNORMAL HIGH (ref 11.5–14.5)
WBC: 6 10*3/uL (ref 4.1–11.1)

## 2016-05-21 LAB — CK W/ CKMB & INDEX
CK - MB: 17.4 NG/ML — ABNORMAL HIGH (ref <3.6)
CK-MB Index: 3 — ABNORMAL HIGH (ref 0–2.5)
CK: 579 U/L — ABNORMAL HIGH (ref 39–308)

## 2016-05-21 LAB — LACTIC ACID: Lactic acid: 1 MMOL/L (ref 0.4–2.0)

## 2016-05-21 LAB — TROPONIN I: Troponin-I, Qt.: 0.07 ng/mL — ABNORMAL HIGH (ref <0.05)

## 2016-05-21 MED ORDER — ONDANSETRON (PF) 4 MG/2 ML INJECTION
4 mg/2 mL | INTRAMUSCULAR | Status: AC
Start: 2016-05-21 — End: 2016-05-21
  Administered 2016-05-21: 23:00:00 via INTRAVENOUS

## 2016-05-21 MED ORDER — FENTANYL CITRATE (PF) 50 MCG/ML IJ SOLN
50 mcg/mL | INTRAMUSCULAR | Status: AC
Start: 2016-05-21 — End: 2016-05-21
  Administered 2016-05-21: 23:00:00 via INTRAVENOUS

## 2016-05-21 MED FILL — ONDANSETRON (PF) 4 MG/2 ML INJECTION: 4 mg/2 mL | INTRAMUSCULAR | Qty: 2

## 2016-05-21 MED FILL — FENTANYL CITRATE (PF) 50 MCG/ML IJ SOLN: 50 mcg/mL | INTRAMUSCULAR | Qty: 2

## 2016-05-21 NOTE — ED Notes (Signed)
Per pt reports generalized abdominal pain, tenderness with palpation, +N/V/D, PMhx of Ascites, Hep B, ESRD and recurrent C-Diff. Recently signed out [facility] at [facility] after two days of admission 27th-29th, "I was unhappy with the nurses and doctors, so I left." Pt denied treatment at dialysis center in [place] on 1/26 due to non compliance with medical treatment, "I'm trying to work a full time job and can't make it to all of my appointments." Left subclavian portal cath for dialysis purposes only, [place] [facility] [place], [place] from [place] LUE due to infection. Emergency Department Nursing Plan of Care       The Nursing Plan of Care is developed from the Nursing assessment and Emergency Department Attending provider initial evaluation.  The plan of care may be reviewed in the ???ED Provider note???.    The Plan of Care was developed with the following considerations:   Patient / Family readiness to learn indicated JW:[id number]:verbalized understanding  Persons(s) to be included in education: patient  Barriers to Learning/Limitations:No    Signed     Betsey Amenshley B Johnson, RN    05/21/2016   4:27 PM

## 2016-05-21 NOTE — ED Triage Notes (Signed)
Patient reports a history of non compliance with medical treatment, reports onset of vomiting and diarrhea x 5 days. Patient reports he is supposed to get dialysis M, W, Friday, last had dialysis one week ago. Patient is short of breath, but able to speak in complete sentences.

## 2016-05-21 NOTE — ED Notes (Signed)
Portable chest x-ray at bedside.

## 2016-05-21 NOTE — ED Notes (Signed)
RAA in ED; Care of patient ended at this time.

## 2016-05-21 NOTE — ED Notes (Signed)
Pt requesting pain medication and updated on plan of care. Pt given ginger ale.

## 2016-05-21 NOTE — ED Notes (Signed)
Pt states improvement in pain at this time.

## 2016-05-21 NOTE — ED Provider Notes (Signed)
HPI Comments: 46 y.o. male with past medical history significant for CKD, C-diff colitis, HTN, asthma, and ascites who presents from [facility] via EMS with chief complaint of chest pain. Pt reports 10/10 chest pain described as "pressure" which began this morning w/ leg swelling. He also c/o N/V/D w/ abdominal pain for 4 days which he believes may be d/t C-diff. Pt states his diarrhea has been "watery and foul-smelling". He last had C-diff 1 month ago. He notes he was last dialyzed 1 week ago; his dialysis treatment was terminated d/t non-compliance. Pt was evaluated for abdominal pain at [facility]. Pt is on oxygen as needed at home. There are no other acute medical concerns at this time.    PCP: None    Note written by Janece Canterbury, Scribe, as dictated by Domingo Cocking, MD 9:21 PM    The history is provided by the patient.        Past Medical History:   Diagnosis Date   ??? Adverse effect of anesthesia     sleep apnea uses oxygen at night    ??? Ascites    ??? Asthma    ??? C. difficile colitis    ??? CKD (chronic kidney disease) stage V requiring chronic dialysis (HCC)    ??? HTN (hypertension)    ??? SBP (spontaneous bacterial peritonitis) [facility])        Past Surgical History:   Procedure Laterality Date   ??? COLONOSCOPY N/A 03/04/2016    COLONOSCOPY performed by Sandy Salaam, MD at [facility]   ??? HX HERNIA REPAIR     ??? SIGMOIDOSCOPY,DIAGNOSTIC  03/04/2016              Family History:   Problem Relation Age of Onset   ??? Hypertension Mother        Social History     Social History   ??? Marital status: UNKNOWN     Spouse name: N/A   ??? Number of children: N/A   ??? Years of education: N/A     Occupational History   ??? Not on file.     Social History Main Topics   ??? Smoking status: Heavy Tobacco Smoker     Packs/day: 0.50     Years: 10.00   ??? Smokeless tobacco: Never Used   ??? Alcohol use No   ??? Drug use: Yes     Special: Prescription, OTC   ??? Sexual activity: Not on file     Other Topics Concern   ??? Not on file      Social History Narrative         ALLERGIES: Lisinopril; Morphine; and Toradol [ketorolac]    Review of Systems   Constitutional: Negative for diaphoresis and fever.   HENT: Negative for facial swelling.    Eyes: Negative for visual disturbance.   Respiratory: Negative for cough.    Cardiovascular: Positive for chest pain and leg swelling.   Gastrointestinal: Positive for abdominal pain, diarrhea, nausea and vomiting.   Genitourinary: Negative for dysuria.   Musculoskeletal: Negative for joint swelling.   Skin: Negative for rash.   Neurological: Negative for headaches.   Hematological: Negative for adenopathy.   Psychiatric/Behavioral: Negative for suicidal ideas.   All other systems reviewed and are negative.      Vitals:    05/21/16 2059   BP: (!) 176/115   Pulse: 86   Resp: 22   Temp: 98.7 ??F (37.1 ??C)   SpO2: 100%   Weight:  97.5 kg (215 lb)   Height: 6\' 1"  (1.854 m)            Physical Exam   Constitutional: He is oriented to person, place, and time. He appears well-developed and well-nourished. No distress.   HENT:   Head: Normocephalic and atraumatic.   Mouth/Throat: Oropharynx is clear and moist.   Eyes: Pupils are equal, round, and reactive to light.   Neck: Normal range of motion. Neck supple.   Cardiovascular: Normal rate, regular rhythm, normal heart sounds and intact distal pulses.    Pulmonary/Chest: Effort normal and breath sounds normal. No respiratory distress.   Dialysis catheter in L chest wall.    Abdominal: Soft. Bowel sounds are normal. He exhibits no distension. There is no tenderness.   Musculoskeletal: Normal range of motion. He exhibits no edema.   Neurological: He is alert and oriented to person, place, and time.   Skin: Skin is warm and dry.   Scars over LUE from old dialysis fistula.    Nursing note and vitals reviewed.  Note written by Janece CanterburyJay F Spangler, Scribe, as dictated by Domingo CockingJeffrey T Sarath Privott, MD 9:21 PM    MDM  Number of Diagnoses or Management Options  ESRD on hemodialysis [facility](HCC):    Hypervolemia, unspecified hypervolemia type:   Diagnosis management comments: A:  [facility] secondary to pt requiring admission for dialysis, abd pain, diarrhea, and volume overload.  Pt arrives with elevated BP, otherwise unremarkable VS.  Pt requesting pain medication shortly after arrival.    P:  [facility] and [facility].  Added on repeat BMP, trop, ecg.  Admission discussed with hospitalist.        ED Course       Procedures    ED EKG interpretation:  Rhythm: normal sinus rhythm. Rate (approx.): 88.  Axis: normal.  ST segment:  No concerning ST elevations or depressions. This EKG was interpreted by Domingo CockingJeffrey T Sherisse Fullilove, MD,ED Provider.

## 2016-05-21 NOTE — ED Notes (Signed)
Pt requesting pain medication; Pt states "that fentanyl did nothing." Provider made aware.

## 2016-05-21 NOTE — ED Provider Notes (Signed)
EMERGENCY DEPARTMENT HISTORY AND PHYSICAL EXAM    Date: 05/21/2016  Patient Name: David [name]    History of Presenting Illness     Chief Complaint   Patient presents with   ??? Diarrhea         History Provided By: Patient    Chief Complaint: abdominal pin  Duration: 2 Weeks  Timing:  Gradual, Progressive and Worsening  Location: generalized abdomen  Quality: Pressure  Severity: 10 out of 10  Modifying Factors: none  Associated Symptoms: chest pain diarrhea shortness of breath      HPI: David [name] is a 46 y.o. male with a PMH of renal failure on dialysis c diff ascites who presents with generalized abdominal pain. Patient admitted to [facility]  Jan 27-29 left without completing care  Patient's last dialysis last Friday in [place] but was terminated there due to non compliance. Patient has had no dialysis.this week. Patient reports diarrhea . He reports abdominal pain chest pain. .   Denies taking anything for the symptoms.    PCP: None    Current Outpatient Prescriptions   Medication Sig Dispense Refill   ??? sevelamer (RENAGEL) 400 mg tablet Take 800 mg by mouth three (3) times daily (with meals).     ??? carvedilol (COREG) 12.5 mg tablet Take  by mouth two (2) times daily (with meals).     ??? hydrALAZINE (APRESOLINE) 100 mg tablet Take 100 mg by mouth three (3) times daily.     ??? amLODIPine (NORVASC) 10 mg tablet Take 10 mg by mouth daily.     ??? furosemide (LASIX) 80 mg tablet Take 80 mg by mouth daily.     ??? albuterol (PROVENTIL HFA, VENTOLIN HFA, PROAIR HFA) 90 mcg/actuation inhaler Take 2 Puffs by inhalation every four (4) hours as needed for Wheezing.     ??? pantoprazole (PROTONIX) 40 mg tablet Take 40 mg by mouth daily.         Past History     Past Medical History:  Past Medical History:   Diagnosis Date   ??? Adverse effect of anesthesia     sleep apnea uses oxygen at night    ??? Ascites    ??? Asthma    ??? C. difficile colitis    ??? CKD (chronic kidney disease) stage V requiring chronic dialysis ([place])     ??? HTN (hypertension)    ??? SBP (spontaneous bacterial peritonitis) [facility])        Past Surgical History:  Past Surgical History:   Procedure Laterality Date   ??? COLONOSCOPY N/A 03/04/2016    COLONOSCOPY performed by Macie Burows, MD at [facility]   ??? HX HERNIA REPAIR     ??? SIGMOIDOSCOPY,DIAGNOSTIC  03/04/2016            Family History:  Family History   Problem Relation Age of Onset   ??? Hypertension Mother        Social History:  Social History   Substance Use Topics   ??? Smoking status: Heavy Tobacco Smoker     Packs/day: 0.50     Years: 10.00   ??? Smokeless tobacco: Never Used   ??? Alcohol use No       Allergies:  Allergies   Allergen Reactions   ??? Lisinopril Shortness of Breath   ??? Morphine Shortness of Breath   ??? Toradol [Ketorolac] Shortness of Breath         Review of Systems   Review of Systems  Constitutional: Positive for fatigue. Negative for fever.   HENT: Negative for sore throat.    Eyes: Negative for redness.   Respiratory: Positive for shortness of breath.    Cardiovascular: Positive for chest pain.   Gastrointestinal: Positive for abdominal pain.   Musculoskeletal: Positive for back pain.   Skin: Negative for rash.   Hematological: Negative for adenopathy.   Psychiatric/Behavioral: Negative for agitation.       Physical Exam     Vitals:    05/21/16 1522 05/21/16 1614 05/21/16 1848   BP: (!) 210/121 (!) 173/97 (!) 159/106   Pulse: 94  93   Resp: 20  19   Temp: 98.8 ??F (37.1 ??C)     SpO2: 100%  97%   Weight: 122.5 kg (270 lb)     Height: 6' 1" (1.854 m)       Physical Exam   Constitutional: He is oriented to person, place, and time. He appears well-developed and well-nourished.   HENT:   Head: Normocephalic and atraumatic.   Right Ear: External ear normal.   Left Ear: External ear normal.   Mouth/Throat: Oropharynx is clear and moist.   Eyes: Conjunctivae are normal. Right eye exhibits no discharge. Left eye exhibits no discharge.   Neck: Normal range of motion. Neck supple.    Cardiovascular: Normal rate and regular rhythm.    Pulmonary/Chest: Effort normal and breath sounds normal. No respiratory distress. He has no wheezes.   Vas cath R upper chest   Abdominal: Soft. Bowel sounds are normal. He exhibits distension and ascites. There is tenderness.   Musculoskeletal: Normal range of motion. He exhibits no edema.   Lymphadenopathy:     He has no cervical adenopathy.   Neurological: He is alert and oriented to person, place, and time. No cranial nerve deficit.   Skin: Skin is warm and dry.   Psychiatric: He has a normal mood and affect. His behavior is normal. Judgment and thought content normal.   Nursing note and vitals reviewed.        Diagnostic Study Results     Labs -     Recent Results (from the past 12 hour(s))   EKG, 12 LEAD, INITIAL    Collection Time: 05/21/16  4:17 PM   Result Value Ref Range    Ventricular Rate 94 BPM    Atrial Rate 94 BPM    P-R Interval 180 ms    QRS Duration 86 ms    Q-T Interval 390 ms    QTC Calculation (Bezet) 487 ms    Calculated P Axis 66 degrees    Calculated R Axis -24 degrees    Calculated T Axis 88 degrees    Diagnosis       Normal sinus rhythm  Possible Left atrial enlargement  Prolonged QT  Abnormal ECG  When compared with ECG of 15-May-2016 16:06,  No significant change was found     CBC WITH AUTOMATED DIFF    Collection Time: 05/21/16  5:08 PM   Result Value Ref Range    WBC 6.0 4.1 - 11.1 K/uL    RBC 2.53 (L) 4.10 - 5.70 M/uL    HGB 7.8 (L) 12.1 - 17.0 g/dL    HCT 24.5 (L) 36.6 - 50.3 %    MCV 96.8 80.0 - 99.0 FL    MCH 30.8 26.0 - 34.0 PG    MCHC 31.8 30.0 - 36.5 g/dL    RDW 15.9 (H) 11.5 - 14.5 %  PLATELET 120 (L) 150 - 400 K/uL    MPV 9.8 8.9 - 12.9 FL    NRBC 0.0 0 PER 100 WBC    ABSOLUTE NRBC 0.00 0.00 - 0.01 K/uL    NEUTROPHILS 75 32 - 75 %    LYMPHOCYTES 8 (L) 12 - 49 %    MONOCYTES 6 5 - 13 %    EOSINOPHILS 11 (H) 0 - 7 %    BASOPHILS 0 0 - 1 %    IMMATURE GRANULOCYTES 0 0.0 - 0.5 %    ABS. NEUTROPHILS 4.4 1.8 - 8.0 K/UL     ABS. LYMPHOCYTES 0.5 (L) 0.8 - 3.5 K/UL    ABS. MONOCYTES 0.4 0.0 - 1.0 K/UL    ABS. EOSINOPHILS 0.7 (H) 0.0 - 0.4 K/UL    ABS. BASOPHILS 0.0 0.0 - 0.1 K/UL    ABS. IMM. GRANS. 0.0 0.00 - 0.04 K/UL    DF SMEAR SCANNED      RBC COMMENTS NORMOCYTIC, NORMOCHROMIC     METABOLIC PANEL, COMPREHENSIVE    Collection Time: 05/21/16  5:08 PM   Result Value Ref Range    Sodium 141 136 - 145 mmol/L    Potassium 5.4 (H) 3.5 - 5.1 mmol/L    Chloride 103 97 - 108 mmol/L    CO2 26 21 - 32 mmol/L    Anion gap 12 5 - 15 mmol/L    Glucose 87 65 - 100 mg/dL    BUN 46 (H) 6 - 20 MG/DL    Creatinine 9.48 (H) 0.70 - 1.30 MG/DL    BUN/Creatinine ratio 5 (L) 12 - 20      GFR est AA 7 (L) >60 ml/min/1.60m    GFR est non-AA 6 (L) >60 ml/min/1.719m   Calcium 7.5 (L) 8.5 - 10.1 MG/DL    Bilirubin, total 0.4 0.2 - 1.0 MG/DL    ALT (SGPT) 12 12 - 78 U/L    AST (SGOT) 26 15 - 37 U/L    Alk. phosphatase 248 (H) 45 - 117 U/L    Protein, total 6.4 6.4 - 8.2 g/dL    Albumin 2.8 (L) 3.5 - 5.0 g/dL    Globulin 3.6 2.0 - 4.0 g/dL    A-G Ratio 0.8 (L) 1.1 - 2.2     CK W/ CKMB & INDEX    Collection Time: 05/21/16  5:08 PM   Result Value Ref Range    CK 579 (H) 39 - 308 U/L    CK - MB 17.4 (H) <3.6 NG/ML    CK-MB Index 3.0 (H) 0 - 2.5     TROPONIN I    Collection Time: 05/21/16  5:08 PM   Result Value Ref Range    Troponin-I, Qt. 0.07 (H) <0.05 ng/mL   LACTIC ACID    Collection Time: 05/21/16  5:13 PM   Result Value Ref Range    Lactic acid 1.0 0.4 - 2.0 MMOL/L   PROTHROMBIN TIME + INR    Collection Time: 05/21/16  5:13 PM   Result Value Ref Range    INR 1.2 (H) 0.9 - 1.1      Prothrombin time 11.5 (H) 9.0 - 11.1 sec       Radiologic Studies -   CT ABD PELV WO CONT   Final Result      XR CHEST PORT   Final Result        CT Results  (Last 48 hours)  05/21/16 1643  CT ABD PELV WO CONT Final result    Impression:  IMPRESSION:    1. No significant change in the appearance of the lesion in the peritoneal    cavity, contiguous with the peritoneal surface deep to the left rectus abdominis   musculature.   2. Large volume of ascites.   3. Incidental findings as above.           Narrative:  EXAM:  CT ABDOMEN PELVIS WITHOUT CONTRAST   INDICATION:  Diarrhea   COMPARISON: CT of the abdomen and pelvis, 05/15/2016, 02/29/2016 and 01/15/2016.   Marland Kitchen   TECHNIQUE:    Unenhanced multislice helical CT was performed from the diaphragm to the   symphysis pubis without intravenous contrast administration. Contiguous 5 mm   axial images were reconstructed and lung and soft tissue windows were generated.   Coronal and sagittal reformations were generated.    CT dose reduction was achieved through use of a standardized protocol tailored   for this examination and automatic exposure control for dose modulation.   Marland Kitchen   FINDINGS:   INCIDENTALLY IMAGED CHEST:   Heart/vessels: Pericardial effusion and/or thickening. Diminished attenuation in   the blood pool suggesting anemia   Lungs/Pleura: Trace, bilateral pleural effusions.   .   ABDOMEN:   Liver: Within normal limits.   Gallbladder/Biliary: Within normal limits.   Spleen: Within normal limits.   Pancreas: Within normal limits.   Adrenals: Within normal limits.   Kidneys: Marked atrophy of the kidneys. Tiny, high attenuation lesion projecting   exophytically off the left kidney which appears unchanged and likely represents   a hemorrhagic cyst.   Peritoneum/Mesenteries: Ascites throughout the abdomen. No significant change in   the appearance of soft tissue attenuation deep to the left rectus abdominis   musculature, contiguous with the peritoneal surface, currently measuring   approximately 3.2 x 2.7 cm in axial dimensions.   Extraperitoneum: Within normal limits.   Gastrointestinal tract: The large bowel is relatively decompressed. There is a   mild degree of diverticulosis in the transverse and descending colon.   Normal-appearing appendix.   Vascular: Calcifications in the aortic arch.   Marland Kitchen    PELVIS:   Extraperitoneum: Within normal limits.   Ureters: Within normal limits.   Bladder: The bladder appears completely decompressed, unremarkable.   Reproductive System: Within normal limits.   .   MSK:    Hernia repair mesh anteriorly in the lower abdominal wall. 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           CXR Results  (Last 48 hours)               05/21/16 1620  XR CHEST PORT Final result    Impression:  IMPRESSION:    Pulmonary vascular congestion change.                       Narrative:  EXAM:  XR CHEST PORT       INDICATION:  Chest pain.       COMPARISON:  05/15/2016.       FINDINGS:     An AP portable view was obtained of the chest.     The left subclavian dialysis catheter remains in place.     A right subclavian vascular stent is present.     The heart is enlarged.     The aorta is atherosclerotic.   There is pulmonary  vascular congestive change.                   Medical Decision Making   I am the first provider for this patient.    I reviewed the vital signs, available nursing notes, past medical history, past surgical history, family history and social history.    Vital Signs-Reviewed the patient's vital signs.    Records Reviewed: Nursing Notes, Old Medical Records and Previous Laboratory Studies    ED Course:   stable  Disposition:  transfer  Patient is being transferred to [facility] ED no floor beds available, transfer accepted by Dr Cornelia Copa . The reasons for their transfer have been discussed with them and available family. They convey agreement and understanding for the need to be transferred as explained to them by this provider.      Provider Notes (Medical Decision Making):   DDX Cdifficile ESRD ascites pleural effusion  Procedures:  Procedures        Diagnosis     Clinical Impression:   1. Other ascites    2. Diarrhea of infectious origin    3. Abdominal pain, generalized

## 2016-05-21 NOTE — Progress Notes (Signed)
Admission Medication Reconciliation:    Information obtained from: Patient    Significant PMH/Disease States:   Past Medical History:   Diagnosis Date   ??? Adverse effect of anesthesia     sleep apnea uses oxygen at night    ??? Ascites    ??? Asthma    ??? C. difficile colitis    ??? CKD (chronic kidney disease) stage V requiring chronic dialysis ([facility])    ??? HTN (hypertension)    ??? SBP (spontaneous bacterial peritonitis) [facility])        Chief Complaint for this Admission:  Chest pain    Allergies:  Lisinopril; Morphine; and Toradol [ketorolac]    Prior to Admission Medications:   Prior to Admission Medications   Prescriptions Last Dose Informant Patient Reported? Taking?   albuterol (PROVENTIL HFA, VENTOLIN HFA, PROAIR HFA) 90 mcg/actuation inhaler 05/21/2016 at Unknown time  Yes Yes   Sig: Take 2 Puffs by inhalation every four (4) hours as needed for Wheezing.   amLODIPine (NORVASC) 10 mg tablet 05/17/2016  Yes No   Sig: Take 10 mg by mouth daily.   carvedilol (COREG) 12.5 mg tablet 05/17/2016  Yes No   Sig: Take  by mouth two (2) times daily (with meals).   furosemide (LASIX) 80 mg tablet 05/17/2016  Yes No   Sig: Take 80 mg by mouth daily.   hydrALAZINE (APRESOLINE) 100 mg tablet 05/17/2016  Yes No   Sig: Take 100 mg by mouth three (3) times daily.   pantoprazole (PROTONIX) 40 mg tablet 05/17/2016  Yes No   Sig: Take 40 mg by mouth daily.   sevelamer (RENAGEL) 400 mg tablet 05/17/2016  Yes No   Sig: Take 800 mg by mouth three (3) times daily (with meals).      Facility-Administered Medications: None         Comments/Recommendations: Reviewed each PTA med list item with patient, who stated that he hadn't taken any meds since Monday, except for his albuterol inhaler (which he uses frequently). Allergies were reviewed individually, no changes.    Thank you for allowing me to participate in the care of your patient.    [place] Mamricia Rowan PharmD, RN [phone]

## 2016-05-21 NOTE — ED Notes (Signed)
Bedside and Verbal shift change report given to me (oncoming nurse) by Morrie Sheldon, RN  (offgoing nurse). Report included the following information SBAR, Kardex and ED Summary.

## 2016-05-21 NOTE — ED Notes (Signed)
Pt reports IV access can be obtained from the right shoulder, NP aware.

## 2016-05-21 NOTE — ED Triage Notes (Signed)
Pt to ED transfer for evaluation of c/p, abdominal pain and being with HD for one week.

## 2016-05-21 NOTE — ED Notes (Signed)
TRANSFER - OUT REPORT:    Verbal report given to Virgel BouquetWendy Seay, RN (name) on David [name]  [facility] for [facility]       Report consisted of patient???s Situation, Background, Assessment and   Recommendations(SBAR).     Information from the following report(s) SBAR, Kardex, ED Summary, [facility] and Recent Results was reviewed with the receiving nurse.    Lines:   Peripheral IV 05/21/16 Right Other(comment) (Active)   Site Assessment Clean, dry, & intact 05/21/2016  6:29 PM   Phlebitis Assessment 0 05/21/2016  6:29 PM   Infiltration Assessment 0 05/21/2016  6:29 PM   Dressing Status Clean, dry, & intact 05/21/2016  6:29 PM   Dressing Type Transparent 05/21/2016  6:29 PM   Hub Color/Line Status Yellow;Patent;Flushed 05/21/2016  6:29 PM        Opportunity for questions and clarification was provided.      Patient transported with:  RAA, pt belongings

## 2016-05-22 ENCOUNTER — Inpatient Hospital Stay
Admit: 2016-05-22 | Discharge: 2016-05-23 | Discharge status: Against Medical Advice | Payer: MEDICARE | Attending: Internal Medicine | Admitting: Internal Medicine

## 2016-05-22 ENCOUNTER — Inpatient Hospital Stay: Admit: 2016-05-22 | Payer: MEDICARE

## 2016-05-22 ENCOUNTER — Inpatient Hospital Stay

## 2016-05-22 LAB — METABOLIC PANEL, BASIC
Anion gap: 11 mmol/L (ref 5–15)
BUN/Creatinine ratio: 5 — ABNORMAL LOW (ref 12–20)
BUN: 44 MG/DL — ABNORMAL HIGH (ref 6–20)
CO2: 24 mmol/L (ref 21–32)
Calcium: 7.2 MG/DL — ABNORMAL LOW (ref 8.5–10.1)
Chloride: 107 mmol/L (ref 97–108)
Creatinine: 9.68 MG/DL — ABNORMAL HIGH (ref 0.70–1.30)
GFR est AA: 7 mL/min/{1.73_m2} — ABNORMAL LOW (ref >60)
GFR est non-AA: 6 mL/min/{1.73_m2} — ABNORMAL LOW (ref >60)
Glucose: 76 mg/dL (ref 65–100)
Potassium: 4.8 mmol/L (ref 3.5–5.1)
Sodium: 142 mmol/L (ref 136–145)

## 2016-05-22 LAB — CBC WITH AUTOMATED DIFF
ABS. BASOPHILS: 0.1 10*3/uL (ref 0.0–0.1)
ABS. EOSINOPHILS: 0.6 10*3/uL — ABNORMAL HIGH (ref 0.0–0.4)
ABS. IMM. GRANS.: 0 10*3/uL (ref 0.00–0.04)
ABS. LYMPHOCYTES: 0.7 10*3/uL — ABNORMAL LOW (ref 0.8–3.5)
ABS. MONOCYTES: 0.4 10*3/uL (ref 0.0–1.0)
ABS. NEUTROPHILS: 3.7 10*3/uL (ref 1.8–8.0)
ABSOLUTE NRBC: 0 10*3/uL (ref 0.00–0.01)
BASOPHILS: 1 % (ref 0–1)
EOSINOPHILS: 11 % — ABNORMAL HIGH (ref 0–7)
HCT: 23.7 % — ABNORMAL LOW (ref 36.6–50.3)
HGB: 7.3 g/dL — ABNORMAL LOW (ref 12.1–17.0)
IMMATURE GRANULOCYTES: 0 % (ref 0.0–0.5)
LYMPHOCYTES: 12 % (ref 12–49)
MCH: 30.8 PG (ref 26.0–34.0)
MCHC: 30.8 g/dL (ref 30.0–36.5)
MCV: 100 FL — ABNORMAL HIGH (ref 80.0–99.0)
MONOCYTES: 7 % (ref 5–13)
MPV: 9.6 FL (ref 8.9–12.9)
NEUTROPHILS: 69 % (ref 32–75)
NRBC: 0 PER 100 WBC
PLATELET: 103 10*3/uL — ABNORMAL LOW (ref 150–400)
RBC: 2.37 M/uL — ABNORMAL LOW (ref 4.10–5.70)
RDW: 15.9 % — ABNORMAL HIGH (ref 11.5–14.5)
WBC: 5.5 10*3/uL (ref 4.1–11.1)

## 2016-05-22 LAB — LIPID PANEL
CHOL/HDL Ratio: 1.6 (ref 0–5.0)
Cholesterol, total: 125 MG/DL (ref <200)
HDL Cholesterol: 79 MG/DL
LDL, calculated: 37.6 MG/DL (ref 0–100)
Triglyceride: 42 MG/DL (ref <150)
VLDL, calculated: 8.4 MG/DL

## 2016-05-22 LAB — LIPASE: Lipase: 1324 U/L — ABNORMAL HIGH (ref 73–393)

## 2016-05-22 LAB — EKG, 12 LEAD, INITIAL
Atrial Rate: 88 {beats}/min
Calculated P Axis: 63 degrees
Calculated R Axis: -12 degrees
Calculated T Axis: 79 degrees
Diagnosis: NORMAL
P-R Interval: 194 ms
Q-T Interval: 400 ms
QRS Duration: 86 ms
QTC Calculation (Bezet): 484 ms
Ventricular Rate: 88 {beats}/min

## 2016-05-22 LAB — TROPONIN I
Troponin-I, Qt.: 0.06 ng/mL — ABNORMAL HIGH (ref <0.05)
Troponin-I, Qt.: 0.06 ng/mL — ABNORMAL HIGH (ref <0.05)

## 2016-05-22 LAB — NT-PRO BNP: NT pro-BNP: >30000 PG/ML — ABNORMAL HIGH (ref 0–125)

## 2016-05-22 LAB — MAGNESIUM: Magnesium: 2.1 mg/dL (ref 1.6–2.4)

## 2016-05-22 LAB — FERRITIN: Ferritin: 1676 NG/ML — ABNORMAL HIGH (ref 26–388)

## 2016-05-22 LAB — POC EG7
Base deficit (POC): 1 mmol/L
Calcium, ionized (POC): 1.02 mmol/L — ABNORMAL LOW (ref 1.12–1.32)
HCO3 (POC): 25.1 MMOL/L (ref 22–26)
pCO2 (POC): 47 MMHG — ABNORMAL HIGH (ref 35.0–45.0)
pH (POC): 7.335 — ABNORMAL LOW (ref 7.35–7.45)
pO2 (POC): 28 MMHG — CL (ref 80–100)
sO2 (POC): 48 % — ABNORMAL LOW (ref 92–97)

## 2016-05-22 LAB — TSH 3RD GENERATION: TSH: 1.98 u[IU]/mL (ref 0.36–3.74)

## 2016-05-22 LAB — IRON PROFILE
Iron % saturation: 53 % — ABNORMAL HIGH (ref 20–50)
Iron: 116 ug/dL (ref 35–150)
TIBC: 217 ug/dL — ABNORMAL LOW (ref 250–450)

## 2016-05-22 LAB — PTT
aPTT: 32.6 s — ABNORMAL HIGH (ref 22.1–32.5)
aPTT: 34.5 s — ABNORMAL HIGH (ref 22.1–32.5)

## 2016-05-22 LAB — PHOSPHORUS: Phosphorus: 4.9 MG/DL — ABNORMAL HIGH (ref 2.6–4.7)

## 2016-05-22 LAB — OCCULT BLOOD, STOOL: Occult blood, stool: NEGATIVE

## 2016-05-22 LAB — VITAMIN B12: Vitamin B12: 294 pg/mL (ref 211–911)

## 2016-05-22 LAB — AMYLASE: Amylase: 260 U/L — ABNORMAL HIGH (ref 25–115)

## 2016-05-22 LAB — FOLATE: Folate: 8.1 ng/mL (ref 5.0–21.0)

## 2016-05-22 LAB — EKG 12-LEAD
Atrial Rate: 88 {beats}/min
Diagnosis: NORMAL
P Axis: 63 degrees
P-R Interval: 194 ms
Q-T Interval: 400 ms
QRS Duration: 86 ms
QTc Calculation (Bazett): 484 ms
R Axis: -12 degrees
T Axis: 79 degrees
Ventricular Rate: 88 {beats}/min

## 2016-05-22 MED ORDER — CLONIDINE 0.2 MG TAB
0.2 mg | Freq: Two times a day (BID) | ORAL | Status: DC
Start: 2016-05-22 — End: 2016-05-23

## 2016-05-22 MED ORDER — ACETAMINOPHEN 325 MG TABLET
325 mg | ORAL | Status: DC | PRN
Start: 2016-05-22 — End: 2016-05-23

## 2016-05-22 MED ORDER — AMLODIPINE 5 MG TAB
5 mg | Freq: Every day | ORAL | Status: DC
Start: 2016-05-22 — End: 2016-05-23
  Administered 2016-05-22: 15:00:00 via ORAL

## 2016-05-22 MED ORDER — CARVEDILOL 6.25 MG TAB
6.25 mg | Freq: Two times a day (BID) | ORAL | Status: DC
Start: 2016-05-22 — End: 2016-05-23
  Administered 2016-05-22: via ORAL

## 2016-05-22 MED ORDER — SODIUM CHLORIDE 0.9 % IJ SYRG
Freq: Three times a day (TID) | INTRAMUSCULAR | Status: DC
Start: 2016-05-22 — End: 2016-05-23
  Administered 2016-05-22 – 2016-05-23 (×4): via INTRAVENOUS

## 2016-05-22 MED ORDER — ONDANSETRON (PF) 4 MG/2 ML INJECTION
4 mg/2 mL | INTRAMUSCULAR | Status: DC | PRN
Start: 2016-05-22 — End: 2016-05-23
  Administered 2016-05-22 – 2016-05-23 (×3): via INTRAVENOUS

## 2016-05-22 MED ORDER — IOPAMIDOL 76 % IV SOLN
370 mg iodine /mL (76 %) | Freq: Once | INTRAVENOUS | Status: AC
Start: 2016-05-22 — End: 2016-05-22
  Administered 2016-05-22: 09:00:00 via INTRAVENOUS

## 2016-05-22 MED ORDER — CARVEDILOL 12.5 MG TAB
12.5 mg | Freq: Two times a day (BID) | ORAL | Status: DC
Start: 2016-05-22 — End: 2016-05-22
  Administered 2016-05-22: 15:00:00 via ORAL

## 2016-05-22 MED ORDER — HYDROMORPHONE (PF) 1 MG/ML IJ SOLN
1 mg/mL | Freq: Once | INTRAMUSCULAR | Status: AC
Start: 2016-05-22 — End: 2016-05-21
  Administered 2016-05-22: 03:00:00 via INTRAVENOUS

## 2016-05-22 MED ORDER — CLONIDINE 0.2 MG TAB
0.2 mg | Freq: Two times a day (BID) | ORAL | Status: DC
Start: 2016-05-22 — End: 2016-05-22

## 2016-05-22 MED ORDER — NICOTINE 21 MG/24 HR DAILY PATCH
21 mg/24 hr | TRANSDERMAL | Status: DC
Start: 2016-05-22 — End: 2016-05-23

## 2016-05-22 MED ORDER — ONDANSETRON 4 MG TAB, RAPID DISSOLVE
4 mg | ORAL | Status: AC
Start: 2016-05-22 — End: 2016-05-21
  Administered 2016-05-22: 03:00:00 via ORAL

## 2016-05-22 MED ORDER — HYDRALAZINE 50 MG TAB
50 mg | Freq: Three times a day (TID) | ORAL | Status: DC
Start: 2016-05-22 — End: 2016-05-23
  Administered 2016-05-22 – 2016-05-23 (×3): via ORAL

## 2016-05-22 MED ORDER — HEPARIN (PORCINE) 5,000 UNIT/ML IJ SOLN
5000 unit/mL | Freq: Once | INTRAMUSCULAR | Status: DC
Start: 2016-05-22 — End: 2016-05-22

## 2016-05-22 MED ORDER — HYDROMORPHONE (PF) 1 MG/ML IJ SOLN
1 mg/mL | INTRAMUSCULAR | Status: DC | PRN
Start: 2016-05-22 — End: 2016-05-22
  Administered 2016-05-22 (×5): via INTRAVENOUS

## 2016-05-22 MED ORDER — HEPARIN (PORCINE) 5,000 UNIT/ML IJ SOLN
5000 unit/mL | Freq: Two times a day (BID) | INTRAMUSCULAR | Status: DC
Start: 2016-05-22 — End: 2016-05-23

## 2016-05-22 MED ORDER — DICYCLOMINE 10 MG CAP
10 mg | ORAL | Status: AC
Start: 2016-05-22 — End: 2016-05-22

## 2016-05-22 MED ORDER — HEPARIN (PORCINE) IN D5W 25,000 UNIT/250 ML IV
25000 unit/250 mL(100 unit/mL) | INTRAVENOUS | Status: DC
Start: 2016-05-22 — End: 2016-05-22

## 2016-05-22 MED ORDER — FENTANYL CITRATE (PF) 50 MCG/ML IJ SOLN
50 mcg/mL | INTRAMUSCULAR | Status: AC
Start: 2016-05-22 — End: 2016-05-21
  Administered 2016-05-22: 01:00:00 via INTRAVENOUS

## 2016-05-22 MED ORDER — SODIUM CHLORIDE 0.9 % IJ SYRG
Freq: Once | INTRAMUSCULAR | Status: AC
Start: 2016-05-22 — End: 2016-05-22
  Administered 2016-05-22: 09:00:00 via INTRAVENOUS

## 2016-05-22 MED ORDER — HYDRALAZINE 20 MG/ML IJ SOLN
20 mg/mL | Freq: Four times a day (QID) | INTRAMUSCULAR | Status: DC | PRN
Start: 2016-05-22 — End: 2016-05-23
  Administered 2016-05-22: 18:00:00 via INTRAVENOUS

## 2016-05-22 MED ORDER — IPRATROPIUM-ALBUTEROL 2.5 MG-0.5 MG/3 ML NEB SOLUTION
2.5 mg-0.5 mg/3 ml | Freq: Four times a day (QID) | RESPIRATORY_TRACT | Status: DC | PRN
Start: 2016-05-22 — End: 2016-05-23

## 2016-05-22 MED ORDER — PANTOPRAZOLE 40 MG TAB, DELAYED RELEASE
40 mg | Freq: Every day | ORAL | Status: DC
Start: 2016-05-22 — End: 2016-05-23
  Administered 2016-05-22: 14:00:00 via ORAL

## 2016-05-22 MED ORDER — LABETALOL 5 MG/ML IV SOLN
5 mg/mL | Freq: Four times a day (QID) | INTRAVENOUS | Status: DC | PRN
Start: 2016-05-22 — End: 2016-05-23

## 2016-05-22 MED ORDER — FUROSEMIDE 40 MG TAB
40 mg | Freq: Every day | ORAL | Status: DC
Start: 2016-05-22 — End: 2016-05-23
  Administered 2016-05-22: 15:00:00 via ORAL

## 2016-05-22 MED ORDER — CLONIDINE 0.1 MG TAB
0.1 mg | ORAL | Status: DC | PRN
Start: 2016-05-22 — End: 2016-05-23

## 2016-05-22 MED ORDER — SEVELAMER CARBONATE 800 MG TAB
800 mg | Freq: Three times a day (TID) | ORAL | Status: DC
Start: 2016-05-22 — End: 2016-05-23
  Administered 2016-05-22 (×3): via ORAL

## 2016-05-22 MED ORDER — SODIUM CHLORIDE 0.9 % IJ SYRG
INTRAMUSCULAR | Status: DC | PRN
Start: 2016-05-22 — End: 2016-05-23
  Administered 2016-05-22: 19:00:00 via INTRAVENOUS

## 2016-05-22 MED ORDER — SODIUM CHLORIDE 0.9% BOLUS IV
0.9 % | Freq: Once | INTRAVENOUS | Status: AC
Start: 2016-05-22 — End: 2016-05-22
  Administered 2016-05-22: 09:00:00 via INTRAVENOUS

## 2016-05-22 MED FILL — CLONIDINE 0.2 MG TAB: 0.2 mg | ORAL | Qty: 1

## 2016-05-22 MED FILL — HYDROMORPHONE (PF) 1 MG/ML IJ SOLN: 1 mg/mL | INTRAMUSCULAR | Qty: 1

## 2016-05-22 MED FILL — NORMAL SALINE FLUSH 0.9 % INJECTION SYRINGE: INTRAMUSCULAR | Qty: 10

## 2016-05-22 MED FILL — HYDRALAZINE 50 MG TAB: 50 mg | ORAL | Qty: 2

## 2016-05-22 MED FILL — CARVEDILOL 12.5 MG TAB: 12.5 mg | ORAL | Qty: 1

## 2016-05-22 MED FILL — MONOJECT PREFILL ADVANCED 0.9 % SODIUM CHLORIDE INJECTION SYRINGE: INTRAMUSCULAR | Qty: 10

## 2016-05-22 MED FILL — FENTANYL CITRATE (PF) 50 MCG/ML IJ SOLN: 50 mcg/mL | INTRAMUSCULAR | Qty: 2

## 2016-05-22 MED FILL — SEVELAMER CARBONATE 800 MG TAB: 800 mg | ORAL | Qty: 1

## 2016-05-22 MED FILL — AMLODIPINE 5 MG TAB: 5 mg | ORAL | Qty: 2

## 2016-05-22 MED FILL — CARVEDILOL 6.25 MG TAB: 6.25 mg | ORAL | Qty: 1

## 2016-05-22 MED FILL — NORMAL SALINE FLUSH 0.9 % INJECTION SYRINGE: INTRAMUSCULAR | Qty: 50

## 2016-05-22 MED FILL — ISOVUE-370  76 % INTRAVENOUS SOLUTION: 370 mg iodine /mL (76 %) | INTRAVENOUS | Qty: 100

## 2016-05-22 MED FILL — LABETALOL 5 MG/ML IV SOLN: 5 mg/mL | INTRAVENOUS | Qty: 20

## 2016-05-22 MED FILL — ONDANSETRON 4 MG TAB, RAPID DISSOLVE: 4 mg | ORAL | Qty: 1

## 2016-05-22 MED FILL — SODIUM CHLORIDE 0.9 % IV: INTRAVENOUS | Qty: 100

## 2016-05-22 MED FILL — ONDANSETRON (PF) 4 MG/2 ML INJECTION: 4 mg/2 mL | INTRAMUSCULAR | Qty: 2

## 2016-05-22 MED FILL — CLONIDINE 0.1 MG TAB: 0.1 mg | ORAL | Qty: 1

## 2016-05-22 MED FILL — HYDRALAZINE 20 MG/ML IJ SOLN: 20 mg/mL | INTRAMUSCULAR | Qty: 1

## 2016-05-22 MED FILL — PANTOPRAZOLE 40 MG TAB, DELAYED RELEASE: 40 mg | ORAL | Qty: 1

## 2016-05-22 MED FILL — HEPARIN (PORCINE) IN D5W 25,000 UNIT/250 ML IV: 25000 unit/250 mL(100 unit/mL) | INTRAVENOUS | Qty: 250

## 2016-05-22 MED FILL — FUROSEMIDE 40 MG TAB: 40 mg | ORAL | Qty: 2

## 2016-05-22 MED FILL — DICYCLOMINE 10 MG CAP: 10 mg | ORAL | Qty: 2

## 2016-05-22 NOTE — ED Notes (Signed)
Pt refused heparin gtt. MD Eniola aware.

## 2016-05-22 NOTE — ED Notes (Signed)
Pt taken for ct scan now.

## 2016-05-22 NOTE — H&P (Signed)
H&P dictated [id number]

## 2016-05-22 NOTE — Progress Notes (Addendum)
TRANSFER - IN REPORT:    Verbal report received from Shawn, RN (name) on David [name]  being received from ED (unit) for routine progression of care      Report consisted of patient???s Situation, Background, Assessment and   Recommendations(SBAR).     Information from the following report(s) SBAR, ED Summary, Procedure Summary, Intake/Output, MAR, Recent Results and Cardiac Rhythm nsr was reviewed with the receiving nurse.    Opportunity for questions and clarification was provided.      Assessment completed upon patient???s arrival to unit and care assumed.     Primary Nurse Edmonia JamesAmanda G Bareford, RN and Melanee SpryIan, RN performed a dual skin assessment on this patient No impairment noted  Braden score is noted on doc flow sheet.     0900  Pt initially refusing heparin drip. RN explains the use and purpose of it and pt verbalizes compliance. APTT drawn.   Pt also refusing blood pressure medications until dialysis time known. Blood pressure 181/117. RN explained the use and purpose of these medications as well as risks (stroke etc.) but pt continues to refuse. Dialysis nurse paged. Will continue to monitor.     [id number]   MD Tomasita Morrowhristopher Thomas cancelled heparin drip.     [id number]  Dialysis nurse paged again.     1000  Pt to be dialyzed at 5 pm today according to dialysis nurse. Blood pressure 193/120. Pt willing to take blood pressure meds. Will continue to monitor and reassess blood pressure.    1100  Blood pressure 174/111. Will continue to monitor and reassess blood pressure.    1243  Blood pressure 196/107. PRN hydralazine given. Will continue to monitor and reassess blood pressure.    1344  Blood pressure 176/104. Pt refusing scheduled Clonidine and prn Labetalol. RN explained the use and purpose of these medications as well as risks (stroke etc.) but pt continues to refuse. Pt states "It's just high because I am in pain and I need dialysis. I don't want any more blood  pressure meds". MD Koduru notified. No new orders received. Will continue to monitor and reassess blood pressure.    1600  Pt refuses hydralazine and coreg. RN explained the use and purpose of these medications as well as risks (stroke etc.) but pt continues to refuse. Will continue to monitor.     1700  Pt to be dialyzed tomorrow morning, according to dialysis RN and MD Mora Applickman. Pt states "I will take my coreg and hydralazine, but not until 6:30 pm when I get my Dilaudid". Will continue to monitor.     1830  Pt given hydralazine and coreg. Will continue to monitor and reassess blood pressure.     1930  Bedside and Verbal shift change report given to Morrie SheldonAshley, [name] (oncoming nurse) by Henriette CombsAmanda,RN (offgoing nurse). Report included the following information SBAR, ED Summary, Procedure Summary, Intake/Output, MAR, Recent Results and Cardiac Rhythm nsr.     Hourly rounds performed.

## 2016-05-22 NOTE — Progress Notes (Signed)
Problem: Falls - Risk of  Goal: *Absence of Falls  Document Schmid Fall Risk and appropriate interventions in the flowsheet.  Outcome: Progressing Towards Goal  Fall Risk Interventions:     Pt has not fallen. Pt up ad lib and compliant with call bell usage.

## 2016-05-22 NOTE — Progress Notes (Signed)
Hospitalist Progress Note  Delon SacramentoSudha Mackson Botz, MD  Answering service: [phone] OR [phone] from in house phone      Date of Service:  05/22/2016  NAME:  David [name]  DOB:  06-04-1970  MRN:  [id number]      Admission Summary:   46 yo man with ESRD on HD, HTN, h/o C diff diarrhea, chronic hypoxic respiratory failure on 2L O2 NC, asthma, anemia of ESRD, HBV and ascites, h/o subclavian stenosis was [facility] to [facility] ED on 05/21/16 for chest pain and missed HD. He was admitted for     Interval history / Subjective:   C/o nausea, vomiting, abdominal and chest pain and diarrhea; BP elevated, currently on 2L     Assessment & Plan:     Chronic chest pain with chronically elevated troponin (POA)  - troponin at baseline  - likely due to fluid overload from missed HD x1 week  - Cardiology consulted; no further workup  - pain control; need to stop opiates    ESRD on HD   - missed HD x1 week due to chronic nonadherence  - plan for HD today per Renal  - pt has no outpatient HD chair due to long h/o nonadherence; requesting CM assistance in setting up new HD center (was let go by [name] due to nonadherence)    Hyperkalemia (POA)   - likely due to missed HD  - resolved spontaneously    HTN, uncontrolled - resumed home meds; needs HD today    H/o C diff diarrhea  - reportedly evaluated for fecal transplant at one time but unable to get it  - C diff and stool Cx 2/3 pending    Anemia of ESRD - H/H stable    Asthma/chronic hypoxic respiratory failure on 2L O2 NC - stable    HBV with ascites - 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     Vital Signs:    Last 24hrs VS reviewed since prior progress note. Most recent are:  Visit Vitals   ??? BP (!) 175/107 (BP 1 Location: Right arm, BP Patient Position: At rest)   ??? Pulse 85   ??? Temp 98.1 ??F (36.7 ??C)   ??? Resp 12   ??? Ht 6\' 1"  (1.854 m)   ??? Wt 97.5 kg (215 lb)   ??? SpO2 100%   ??? BMI 28.37 kg/m2     No intake or output data in the 24 hours ending 05/22/16 0830     Physical Examination:     Constitutional:  awake, no acute distress, cooperative, 2L NC   ENT:  oral mucosa moist, oropharynx benign  Neck supple, no masses   Resp:  CTA bilaterally, no wheezing/rhonchi/rales, left chest wall permacath   CV:  regular rhythm, normal rate, no m/r/g appreciated, no peripheral edema    GI:  +BS, soft, non distended, diffusely tender     Musculoskeletal:  moves all extremities  Neurologic:  AAOx3, NFD     Skin:  warm, dry; tattoos  Eyes:  PERRL    Data Review:    Review and/or order of clinical lab test  Review and/or order of tests in the radiology section of CPT  Review and/or order of tests in the medicine section of CPT    Labs:     Recent Labs      05/22/16   0318  05/21/16   1708   WBC  5.5  6.0   HGB  7.3*  7.8*   HCT  23.7*  24.5*   PLT  103*  120*     Recent Labs      05/22/16   0318  05/21/16   2154  05/21/16   1708   NA   --   142  141   K   --   4.8  5.4*   CL   --   107  103   CO2   --   24  26   BUN   --   44*  46*   CREA   --   9.68*  9.48*   GLU   --   76  87   CA   --   7.2*  7.5*   MG  2.1   --    --    PHOS  4.9*   --    --      Recent Labs      05/22/16   0318  05/21/16   1708   SGOT   --   26   ALT   --   12   AP   --   248*   TBILI   --   0.4   TP   --   6.4   ALB   --   2.8*   GLOB   --   3.6   AML  260*   --    LPSE  1324*   --      Recent Labs      05/22/16   0318  05/21/16   1713   INR   --   1.2*   PTP   --   11.5*   APTT  32.6*   --       Recent Labs      05/22/16   0318   TIBC  217*    PSAT  53*   FERR  1676*      Lab Results   Component Value Date/Time    Folate 8.1 05/21/2016 09:54 PM      No results for input(s): PH, PCO2, PO2 in the last 72 hours.  Recent Labs      05/22/16   0318  05/21/16   2154  05/21/16   1708   CPK   --    --   579*   CKNDX   --    --   3.0*   TROIQ  0.06*  0.06*  0.07*     Lab Results   Component Value Date/Time    Cholesterol, total 125 05/22/2016 03:18 AM    HDL Cholesterol 79 05/22/2016 03:18 AM    LDL, calculated 37.6 05/22/2016 03:18 AM    Triglyceride 42 05/22/2016 03:18 AM    CHOL/HDL Ratio 1.6 05/22/2016 03:18 AM     Lab Results   Component Value Date/Time    Glucose (POC) 96 03/04/2016 02:04 PM    Glucose (  POC) 105 02/29/2016 11:05 AM     No results found for: COLOR, APPRN, SPGRU, REFSG, PHU, PROTU, GLUCU, KETU, BILU, UROU, NITU, LEUKU, GLUKE, EPSU, BACTU, WBCU, RBCU, CASTS, UCRY    Medications Reviewed:     Current Facility-Administered Medications   Medication Dose Route Frequency   ??? amLODIPine (NORVASC) tablet 10 mg  10 mg Oral DAILY   ??? carvedilol (COREG) tablet 12.5 mg  12.5 mg Oral BID WITH MEALS   ??? furosemide (LASIX) tablet 80 mg  80 mg Oral DAILY   ??? hydrALAZINE (APRESOLINE) tablet 100 mg  100 mg Oral TID   ??? pantoprazole (PROTONIX) tablet 40 mg  40 mg Oral DAILY   ??? sevelamer carbonate (RENVELA) tab 800 mg  800 mg Oral TID WITH MEALS   ??? sodium chloride (NS) flush 5-10 mL  5-10 mL IntraVENous Q8H   ??? sodium chloride (NS) flush 5-10 mL  5-10 mL IntraVENous PRN   ??? acetaminophen (TYLENOL) tablet 650 mg  650 mg Oral Q4H PRN   ??? nicotine (NICODERM CQ) 21 mg/24 hr patch 1 Patch  1 Patch TransDERmal Q24H   ??? heparin (porcine) injection 4,000 Units  41 Units/kg IntraVENous ONCE   ??? heparin 25,000 units in D5W 250 ml infusion  10-25 Units/kg/hr IntraVENous TITRATE   ??? albuterol-ipratropium (DUO-NEB) 2.5 MG-0.5 MG/3 ML  3 mL Nebulization Q6H PRN   ??? hydrALAZINE (APRESOLINE) 20 mg/mL injection 20 mg  20 mg IntraVENous Q6H PRN    ??? cloNIDine HCl (CATAPRES) tablet 0.1 mg  0.1 mg Oral Q4H PRN   ??? dicyclomine (BENTYL) capsule 20 mg  20 mg Oral NOW   ??? HYDROmorphone (PF) (DILAUDID) injection 1 mg  1 mg IntraVENous Q4H PRN   ??? ondansetron (ZOFRAN) injection 4 mg  4 mg IntraVENous Q4H PRN     Current Outpatient Prescriptions   Medication Sig   ??? albuterol (PROVENTIL HFA, VENTOLIN HFA, PROAIR HFA) 90 mcg/actuation inhaler Take 2 Puffs by inhalation every four (4) hours as needed for Wheezing.   ??? sevelamer (RENAGEL) 400 mg tablet Take 800 mg by mouth three (3) times daily (with meals).   ??? carvedilol (COREG) 12.5 mg tablet Take  by mouth two (2) times daily (with meals).   ??? hydrALAZINE (APRESOLINE) 100 mg tablet Take 100 mg by mouth three (3) times daily.   ??? amLODIPine (NORVASC) 10 mg tablet Take 10 mg by mouth daily.   ??? pantoprazole (PROTONIX) 40 mg tablet Take 40 mg by mouth daily.   ??? furosemide (LASIX) 80 mg tablet Take 80 mg by mouth daily.     ______________________________________________________________________  EXPECTED LENGTH OF STAY: - - -  ACTUAL LENGTH OF STAY:          1                 Banner Huckaba, MD

## 2016-05-22 NOTE — Consults (Signed)
CARDIOLOGY CONSULT                  Subjective:    Date of  Admission: 05/21/2016  8:48 PM     Admission type:Emergency    David [name] is a 46 y.o. male admitted for Chest pain. Mr. David [name] has a long standing history of noncompliance with his HD and presented after not having HD for a few days. He has had episodes in the past of CP associated with this scenario. He went to [facility] for HD but was sent here for CP. His troponin was mildly elevated at 0.07->0.06. Chart review from here and other facilities show that he chronically runs a troponin which is mildly elevated at this level. He had been c/o of N/V/D but he was eating lung when I made rounds    Patient Active Problem List    Diagnosis Date Noted   ??? NSTEMI (non-ST elevated myocardial infarction) ([place]) 05/22/2016   ??? C. difficile colitis 05/15/2016   ??? Hyperkalemia 05/15/2016   ??? H/O noncompliance with medical treatment, presenting hazards to health 03/09/2016   ??? Diarrhea of infectious origin 03/09/2016   ??? Left arm swelling 03/09/2016   ??? Pain in left upper arm 03/09/2016   ??? Ascites 03/04/2016   ??? ESRD (end stage renal disease) on dialysis ([place]) 03/04/2016   ??? 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 Peritonitis ([place]) 11/21/2015   ??? Gastroenteritis 10/14/2015      None  Past Medical History:   Diagnosis Date   ??? Adverse effect of anesthesia     sleep apnea uses oxygen at night    ??? Ascites    ??? Asthma    ??? C. difficile colitis    ??? CKD (chronic kidney disease) stage V requiring chronic dialysis ([place])    ??? HTN (hypertension)    ??? SBP (spontaneous bacterial peritonitis) [facility])       Past Surgical History:   Procedure Laterality Date   ??? COLONOSCOPY N/A 03/04/2016    COLONOSCOPY performed by Macie Burows, MD at [facility]   ??? HX HERNIA REPAIR     ??? SIGMOIDOSCOPY,DIAGNOSTIC  03/04/2016          Allergies   Allergen Reactions   ??? Lisinopril Shortness of Breath   ??? Morphine Shortness of Breath   ??? Toradol [Ketorolac] Shortness of Breath       Family History   Problem Relation Age of Onset   ??? Hypertension Mother       Current Facility-Administered Medications   Medication Dose Route Frequency   ??? amLODIPine (NORVASC) tablet 10 mg  10 mg Oral DAILY   ??? carvedilol (COREG) tablet 12.5 mg  12.5 mg Oral BID WITH MEALS   ??? furosemide (LASIX) tablet 80 mg  80 mg Oral DAILY   ??? hydrALAZINE (APRESOLINE) tablet 100 mg  100 mg Oral TID   ??? pantoprazole (PROTONIX) tablet 40 mg  40 mg Oral DAILY   ??? sevelamer carbonate (RENVELA) tab 800 mg  800 mg Oral TID WITH MEALS   ??? sodium chloride (NS) flush 5-10 mL  5-10 mL IntraVENous Q8H   ??? sodium chloride (NS) flush 5-10 mL  5-10 mL IntraVENous PRN   ??? acetaminophen (TYLENOL) tablet 650 mg  650 mg Oral Q4H PRN   ??? nicotine (NICODERM CQ) 21 mg/24 hr patch 1 Patch  1 Patch TransDERmal Q24H   ??? albuterol-ipratropium (DUO-NEB) 2.5 MG-0.5 MG/3 ML  3 mL Nebulization  Q6H PRN   ??? hydrALAZINE (APRESOLINE) 20 mg/mL injection 20 mg  20 mg IntraVENous Q6H PRN   ??? cloNIDine HCl (CATAPRES) tablet 0.1 mg  0.1 mg Oral Q4H PRN   ??? heparin (porcine) injection 5,000 Units  5,000 Units SubCUTAneous Q12H   ??? cloNIDine HCl (CATAPRES) tablet 0.2 mg  0.2 mg Oral BID   ??? HYDROmorphone (PF) (DILAUDID) injection 1 mg  1 mg IntraVENous Q4H PRN   ??? ondansetron (ZOFRAN) injection 4 mg  4 mg IntraVENous Q4H PRN         Review of Symptoms:  Gen - no F/C/S  Eyes - no vision changes  ENT - no sore throat, rhinorrhea, otalgia  CV - + CP, no palpitations, no orthopnea, no PND, no LEE  Resp no cough, no SOB/DOE  GI - no AP, no n/v/d/c  GU - no dysuria, no hematuria  MSK - no abnormal joint pains  Skin - no rashes  Neuro - no HA, no numbness, no weakness, no slurred speech  Psych - no change in mood         Physical Exam    Visit Vitals   ??? BP (!) 196/107   ??? Pulse 87   ??? Temp 98.3 ??F (36.8 ??C)   ??? Resp 20   ??? Ht '6\' 1"'  (1.854 m)   ??? Wt 98.2 kg (216 lb 7.9 oz)   ??? SpO2 98%   ??? BMI 28.56 kg/m2     NAD  Skin warm and dry  Nl conjunctiva   Oropharynx without exudate.    Neck supple  Lungs clear  Normal S1/ S2 with occasional ectopy  No Murmurs, click or Rubs  Abdomen soft and non tender  Pulses 2+ radials  Trace LEE  Neuro:  Grossly intact  Appropriate    Cardiographics    Telemetry: normal sinus rhythm  ECG: normal sinus rhythm, nonspecific ST and T waves changes      Labs:   Recent Results (from the past 24 hour(s))   EKG, 12 LEAD, INITIAL    Collection Time: 05/21/16  4:17 PM   Result Value Ref Range    Ventricular Rate 94 BPM    Atrial Rate 94 BPM    P-R Interval 180 ms    QRS Duration 86 ms    Q-T Interval 390 ms    QTC Calculation (Bezet) 487 ms    Calculated P Axis 66 degrees    Calculated R Axis -24 degrees    Calculated T Axis 88 degrees    Diagnosis       Normal sinus rhythm  Possible Left atrial enlargement  Prolonged QT  Abnormal ECG  When compared with ECG of 15-May-2016 16:06,  No significant change was found     CBC WITH AUTOMATED DIFF    Collection Time: 05/21/16  5:08 PM   Result Value Ref Range    WBC 6.0 4.1 - 11.1 K/uL    RBC 2.53 (L) 4.10 - 5.70 M/uL    HGB 7.8 (L) 12.1 - 17.0 g/dL    HCT 24.5 (L) 36.6 - 50.3 %    MCV 96.8 80.0 - 99.0 FL    MCH 30.8 26.0 - 34.0 PG    MCHC 31.8 30.0 - 36.5 g/dL    RDW 15.9 (H) 11.5 - 14.5 %    PLATELET 120 (L) 150 - 400 K/uL    MPV 9.8 8.9 - 12.9 FL    NRBC 0.0 0 PER 100 WBC  ABSOLUTE NRBC 0.00 0.00 - 0.01 K/uL    NEUTROPHILS 75 32 - 75 %    LYMPHOCYTES 8 (L) 12 - 49 %    MONOCYTES 6 5 - 13 %    EOSINOPHILS 11 (H) 0 - 7 %    BASOPHILS 0 0 - 1 %    IMMATURE GRANULOCYTES 0 0.0 - 0.5 %    ABS. NEUTROPHILS 4.4 1.8 - 8.0 K/UL    ABS. LYMPHOCYTES 0.5 (L) 0.8 - 3.5 K/UL    ABS. MONOCYTES 0.4 0.0 - 1.0 K/UL    ABS. EOSINOPHILS 0.7 (H) 0.0 - 0.4 K/UL    ABS. BASOPHILS 0.0 0.0 - 0.1 K/UL    ABS. IMM. GRANS. 0.0 0.00 - 0.04 K/UL    DF SMEAR SCANNED      RBC COMMENTS NORMOCYTIC, NORMOCHROMIC     METABOLIC PANEL, COMPREHENSIVE    Collection Time: 05/21/16  5:08 PM   Result Value Ref Range     Sodium 141 136 - 145 mmol/L    Potassium 5.4 (H) 3.5 - 5.1 mmol/L    Chloride 103 97 - 108 mmol/L    CO2 26 21 - 32 mmol/L    Anion gap 12 5 - 15 mmol/L    Glucose 87 65 - 100 mg/dL    BUN 46 (H) 6 - 20 MG/DL    Creatinine 9.48 (H) 0.70 - 1.30 MG/DL    BUN/Creatinine ratio 5 (L) 12 - 20      GFR est AA 7 (L) >60 ml/min/1.35m    GFR est non-AA 6 (L) >60 ml/min/1.789m   Calcium 7.5 (L) 8.5 - 10.1 MG/DL    Bilirubin, total 0.4 0.2 - 1.0 MG/DL    ALT (SGPT) 12 12 - 78 U/L    AST (SGOT) 26 15 - 37 U/L    Alk. phosphatase 248 (H) 45 - 117 U/L    Protein, total 6.4 6.4 - 8.2 g/dL    Albumin 2.8 (L) 3.5 - 5.0 g/dL    Globulin 3.6 2.0 - 4.0 g/dL    A-G Ratio 0.8 (L) 1.1 - 2.2     CK W/ CKMB & INDEX    Collection Time: 05/21/16  5:08 PM   Result Value Ref Range    CK 579 (H) 39 - 308 U/L    CK - MB 17.4 (H) <3.6 NG/ML    CK-MB Index 3.0 (H) 0 - 2.5     TROPONIN I    Collection Time: 05/21/16  5:08 PM   Result Value Ref Range    Troponin-I, Qt. 0.07 (H) <0.05 ng/mL   LACTIC ACID    Collection Time: 05/21/16  5:13 PM   Result Value Ref Range    Lactic acid 1.0 0.4 - 2.0 MMOL/L   PROTHROMBIN TIME + INR    Collection Time: 05/21/16  5:13 PM   Result Value Ref Range    INR 1.2 (H) 0.9 - 1.1      Prothrombin time 11.5 (H) 9.0 - 11.1 sec   EKG, 12 LEAD, INITIAL    Collection Time: 05/21/16  9:05 PM   Result Value Ref Range    Ventricular Rate 88 BPM    Atrial Rate 88 BPM    P-R Interval 194 ms    QRS Duration 86 ms    Q-T Interval 400 ms    QTC Calculation (Bezet) 484 ms    Calculated P Axis 63 degrees    Calculated R Axis -12 degrees  Calculated T Axis 79 degrees    Diagnosis       ** Poor data quality, interpretation may be adversely affected  Normal sinus rhythm  Prolonged QT  When compared with ECG of 21-May-2016 16:17,  MANUAL COMPARISON REQUIRED, DATA IS UNCONFIRMED     METABOLIC PANEL, BASIC    Collection Time: 05/21/16  9:54 PM   Result Value Ref Range    Sodium 142 136 - 145 mmol/L    Potassium 4.8 3.5 - 5.1 mmol/L     Chloride 107 97 - 108 mmol/L    CO2 24 21 - 32 mmol/L    Anion gap 11 5 - 15 mmol/L    Glucose 76 65 - 100 mg/dL    BUN 44 (H) 6 - 20 MG/DL    Creatinine 9.68 (H) 0.70 - 1.30 MG/DL    BUN/Creatinine ratio 5 (L) 12 - 20      GFR est AA 7 (L) >60 ml/min/1.72m    GFR est non-AA 6 (L) >60 ml/min/1.772m   Calcium 7.2 (L) 8.5 - 10.1 MG/DL   TROPONIN I    Collection Time: 05/21/16  9:54 PM   Result Value Ref Range    Troponin-I, Qt. 0.06 (H) <0.05 ng/mL   TSH 3RD GENERATION    Collection Time: 05/21/16  9:54 PM   Result Value Ref Range    TSH 1.98 0.36 - 3.74 uIU/mL   VITAMIN B12    Collection Time: 05/21/16  9:54 PM   Result Value Ref Range    Vitamin B12 294 211 - 911 pg/mL   FOLATE    Collection Time: 05/21/16  9:54 PM   Result Value Ref Range    Folate 8.1 5.0 - 21.0 ng/mL   POC EG7    Collection Time: 05/22/16  3:16 AM   Result Value Ref Range    Calcium, ionized (POC) 1.02 (L) 1.12 - 1.32 mmol/L    pH (POC) 7.335 (L) 7.35 - 7.45      pCO2 (POC) 47.0 (H) 35.0 - 45.0 MMHG    pO2 (POC) 28 (LL) 80 - 100 MMHG    HCO3 (POC) 25.1 22 - 26 MMOL/L    Base deficit (POC) 1 mmol/L    sO2 (POC) 48 (L) 92 - 97 %    Site OTHER      Device: ROOM AIR      Allens test (POC) N/A      Specimen type (POC) VENOUS BLOOD     CBC WITH AUTOMATED DIFF    Collection Time: 05/22/16  3:18 AM   Result Value Ref Range    WBC 5.5 4.1 - 11.1 K/uL    RBC 2.37 (L) 4.10 - 5.70 M/uL    HGB 7.3 (L) 12.1 - 17.0 g/dL    HCT 23.7 (L) 36.6 - 50.3 %    MCV 100.0 (H) 80.0 - 99.0 FL    MCH 30.8 26.0 - 34.0 PG    MCHC 30.8 30.0 - 36.5 g/dL    RDW 15.9 (H) 11.5 - 14.5 %    PLATELET 103 (L) 150 - 400 K/uL    MPV 9.6 8.9 - 12.9 FL    NRBC 0.0 0 PER 100 WBC    ABSOLUTE NRBC 0.00 0.00 - 0.01 K/uL    NEUTROPHILS 69 32 - 75 %    LYMPHOCYTES 12 12 - 49 %    MONOCYTES 7 5 - 13 %    EOSINOPHILS 11 (H) 0 - 7 %  BASOPHILS 1 0 - 1 %    IMMATURE GRANULOCYTES 0 0.0 - 0.5 %    ABS. NEUTROPHILS 3.7 1.8 - 8.0 K/UL    ABS. LYMPHOCYTES 0.7 (L) 0.8 - 3.5 K/UL     ABS. MONOCYTES 0.4 0.0 - 1.0 K/UL    ABS. EOSINOPHILS 0.6 (H) 0.0 - 0.4 K/UL    ABS. BASOPHILS 0.1 0.0 - 0.1 K/UL    ABS. IMM. GRANS. 0.0 0.00 - 0.04 K/UL    DF SMEAR SCANNED      RBC COMMENTS ANISOCYTOSIS  1+        RBC COMMENTS MACROCYTOSIS  1+        RBC COMMENTS OVALOCYTES  PRESENT       PTT    Collection Time: 05/22/16  3:18 AM   Result Value Ref Range    aPTT 32.6 (H) 22.1 - 32.5 sec    aPTT, therapeutic range     58.0 - 77.0 SECS   PHOSPHORUS    Collection Time: 05/22/16  3:18 AM   Result Value Ref Range    Phosphorus 4.9 (H) 2.6 - 4.7 MG/DL   FERRITIN    Collection Time: 05/22/16  3:18 AM   Result Value Ref Range    Ferritin 1676 (H) 26 - 388 NG/ML   MAGNESIUM    Collection Time: 05/22/16  3:18 AM   Result Value Ref Range    Magnesium 2.1 1.6 - 2.4 mg/dL   LIPID PANEL    Collection Time: 05/22/16  3:18 AM   Result Value Ref Range    LIPID PROFILE          Cholesterol, total 125 <200 MG/DL    Triglyceride 42 <150 MG/DL    HDL Cholesterol 79 MG/DL    LDL, calculated 37.6 0 - 100 MG/DL    VLDL, calculated 8.4 MG/DL    CHOL/HDL Ratio 1.6 0 - 5.0     LIPASE    Collection Time: 05/22/16  3:18 AM   Result Value Ref Range    Lipase 1324 (H) 73 - 393 U/L   NT-PRO BNP    Collection Time: 05/22/16  3:18 AM   Result Value Ref Range    NT pro-BNP >30000 (H) 0 - 125 PG/ML   IRON PROFILE    Collection Time: 05/22/16  3:18 AM   Result Value Ref Range    Iron 116 35 - 150 ug/dL    TIBC 217 (L) 250 - 450 ug/dL    Iron % saturation 53 (H) 20 - 50 %   AMYLASE    Collection Time: 05/22/16  3:18 AM   Result Value Ref Range    Amylase 260 (H) 25 - 115 U/L   TROPONIN I    Collection Time: 05/22/16  3:18 AM   Result Value Ref Range    Troponin-I, Qt. 0.06 (H) <0.05 ng/mL   PTT    Collection Time: 05/22/16  9:20 AM   Result Value Ref Range    aPTT 34.5 (H) 22.1 - 32.5 sec    aPTT, therapeutic range     58.0 - 77.0 SECS        Assessment and Plan:    This is a 65 yom with ESRD, HD who presented with need for HD and  transferred for CP. This presentation is congruent with a presentation to [facility] a few months ago. Stress testing at that time was negative    No indication to check further troponins -> downtrending and no  change in clinical condition  Would not recommend ischemic evaluation, he had one recently and very doubtful this complaint is angina, his discomfort is more likely related to volume overload  Increased his carvediol  Added a now dose to the clonidine started by Dr. Beverley Fiedler anticoagulation with heparin as unlikely to be anginal and elevated troponin is not c/w NSTEMI    Thank you for this consult, please call with questions     Assessment:

## 2016-05-22 NOTE — ED Notes (Signed)
Pt placed in inpatient hospital bed.

## 2016-05-22 NOTE — H&P (Signed)
[facility] HISTORY [facility] PHYSICAL    Name:David [name], David [name]  MR#: [id number]  DOB: [birth year]  ACCOUNT #: [id number]   DATE OF SERVICE: 05/22/2016    PRIMARY CARE PHYSICIAN:  None.    SOURCE OF INFORMATION:  The patient.    CHIEF COMPLAINT:  Chest pain.    HISTORY OF PRESENT ILLNESS:  This is a 46 year old man with a past medical history significant for end-stage renal disease on hemodialysis, hypertension, asthma, ascites, obstructive sleep apnea, was in his usual state of health until about 4 days ago when patient started experiencing chest pain.  The chest pain is located at the center of the chest with radiation to the back.  The pain is a constant, 10/10 in severity.  Patient describes the pain as pressure like.  No known aggravating or relieving factors, associated with bilateral lower extremity swelling.  Patient also complained of nausea, vomiting and diarrhea.  He has had a history of C. diff colitis in the past.  The patient also has associated abdominal pain.  These symptoms started 4 days ago as well.  Patient described the diarrhea as watery, foul smelling stool.  Patient went to [facility] because of inability to perform dialysis at [facility], the patient was transferred to the emergency room here.  He has not had dialysis for over a week.  Patient has noncompliance with treatment.  It was reported that his dialysis was terminated because of that.  Patient also has history of abdominal ascites, according to the patient.  A further evaluation for the ascites and the etiology of the ascites is not known.  Liver cirrhosis was once suspected as the cause of the ascites.  He reported increased abdominal swelling, as well as increased leg swelling.  When the patient arrived at the emergency room, first set of troponin was positive.  Patient was referred to the hospitalist service for evaluation for admission.     REVIEW OF SYSTEMS:  No history of fever, no rigor, no chills.    PAST MEDICAL HISTORY:  End-stage renal disease, on hemodialysis, hypertension, ascites, obstructive sleep apnea, asthma.    ALLERGIES:  PATIENT IS ALLERGIC TO LISINOPRIL, MORPHINE, TORADOL.    MEDICATIONS:  Albuterol 90 mcg 2 puffs by inhalation every 4 hours as needed for wheezing, Norvasc 10 mg daily, Coreg 12.5 mg twice daily, Lasix 80 mg daily, Apresoline 100 mg 3 times daily.  Protonix 40 mg daily, Renagel 400 mg 3 times daily.      FAMILY HISTORY:   This was reviewed.  His mother had hypertension.    PAST SURGICAL HISTORY:  Significant for hernia repair, placement of vascular access for hemodialysis.    SOCIAL HISTORY:  Patient smokes about half a pack of cigarettes daily.  Denies alcohol abuse.    REVIEW OF SYSTEMS:  HEENT:  No headache, no dizziness, no blurring of vision.  No photophobia.  RESPIRATORY:  Positive for shortness of breath.  No cough, no hemoptysis.  CARDIOVASCULAR:  Positive for chest pain, orthopnea.  No palpitations.  GASTROINTESTINAL:  Positive for nausea and vomiting, diarrhea and abdominal pain.  GENITOURINARY:  No dysuria, no urgency and no frequency.    All other systems are reviewed and they are negative.    PHYSICAL EXAMINATION:  GENERAL:  Patient appeared ill, in moderate distress.  VITAL SIGNS:  On arrival to the emergency room, temperature 98.7, pulse 86, respiratory rate 22, blood pressure 176/115, oxygen saturation 100% on  2 liters of oxygen.    HEAD:  Normocephalic, atraumatic.  EYES:  Normal eye movement.  No redness, no drainage, no discharge.  EARS:  Normal external ears with no obvious drainage.    NOSE:  No deformity, no drainage.    MOUTH AND THROAT:  No visible oral lesion.  NECK:  Supple, no JVD, no thyromegaly.  CHEST:  Few bilateral basilar crackles, no wheezing.  HEART:  Normal S1 and S2, regular.  No clinically appreciable murmur.  ABDOMEN:  Soft, distended, ascites 3+.  Normal bowel sounds.   CENTRAL NERVOUS SYSTEM:  Alert, oriented x3.  No gross focal neurological deficits.  EXTREMITIES:  Edema 2+, pulses 2+ bilaterally.  MUSCULOSKELETAL:  No obvious joint deformity or swelling.  SKIN:  Hyperpigmentation of the lower extremities noted.  PSYCHIATRY normal mood and affect.    LYMPHATIC SYSTEM:  No cervical lymphadenopathy.    DIAGNOSTIC DATA:  EKG shows normal sinus rhythm, nonspecific ST and T-wave abnormalities.  Chest x-ray shows pulmonary vascular congestion.    LABORATORY DATA:  Chemistry:  Sodium 142, potassium 4.8, chloride 107, CO2 of 24, glucose 76, BUN 44, creatinine 9.68, calcium 7.2.  Cardiac profile, troponin 0.06.  Hematology:  WBC 6.0, hemoglobin 7.8, hematocrit 24.5, platelet 120.  Coagulation profile:  INR 1.2.  PT 11.5.      ASSESSMENT AND PLAN:  1.  Non-ST elevation myocardial infarction.  2.  End-stage renal disease with fluid overload, on hemodialysis.  3.  Hypertension.  4.  Abdominal ascites.   5.  Acute gastroenteritis.  6.  Obstructive sleep apnea.  7.  Asthma.  8.  Tobacco abuse.  9.  Bilateral leg swelling.    10.  Hypocalcemia  11.  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  12.  Thrombocytopenia.    PLAN:  1.  Non-ST elevation myocardial infarction.  We will admit the patient for further evaluation and treatment.  We will continue with beta blocker.  We started the patient on full dose heparin.  We will obtain serial cardiac markers.  Cardiology consult will be requested to assist in evaluation and treatment.  Since the patient is with radiation to the chest, we will obtain a CTA of the chest to rule out aortic dissection, as well as a pulmonary embolism.    2.  End-stage renal disease with fluid overload.  Nephrology consult will be requested for hemodialysis.  We checked a proBNP level as well.  3.  Hypertension.  We will resume preadmission medication.  We will monitor the patient's blood pressure closely.  Patient will also be placed  on hydralazine as needed for blood pressure control.   4.  Abdominal ascites.  We will obtain abdominal ultrasound for further evaluation of the ascites.  The patient may require a paracentesis due to significant ascites on the ultrasound.  5.  Acute gastroenteritis.  We will carry out stool studies, including stool for C. diff antigen, especially in the patient who had C. diff diarrhea in the past.  We will obtain a CT scan of the abdomen and pelvis to evaluate the patient for colitis.    6.  Obstructive sleep apnea.  Continue with CPAP with home settings.    7.  Asthma.  We will place the patient on DuoNebs.    8.  Tobacco abuse.  The patient advised to quit smoking.  We will place the patient on Nicoderm patch.    9.  Bilateral leg swelling.  We will check an ultrasound of the  lower extremities for DVT.   10.  Hypocalcemia.  We will check ionized calcium level to determine whether this is true hypoglycemia.  11.  Anemia.  This is secondary to end-stage renal disease.  We will carry out anemia workup, including stool guaiac to rule out occult gastrointestinal bleed.    12.  Thrombocytopenia.  This is mild.  We will monitor the patient's platelet count.  If there is a further drop in the patient's platelet count, then we will discontinue heparin.      OTHER ISSUES:  CODE STATUS:  The patient is a FULL CODE.  The patient is on full dose heparin for treatment of non-ST elevation myocardial infarction.  Because of that, there is no need for DVT prophylaxis with Lovenox.      Sander NephewAZAAK Jamal Pavon, MD       RE / CN  D: 05/22/2016 01:13     T: 05/22/2016 05:23  JOB #: [id number]

## 2016-05-22 NOTE — Consults (Addendum)
[facility] Consult Note        NAME: David [name]       DOB:  1971-02-16       MRN:  [id number]     Date/Time: 05/22/2016    Risk of deterioration: medium       Assessment:    Plan:  ESRD  HTN  CP-had stress 2017 at [facility]  N/V/Diarrhea-seems to be chronic  Anemia  Hx of Cdiff  Hx of hep b and ascites  HX of subclavian stenosis Pt discharged from [name] due to non compliance. HD at [facility] on 1/28-HD at [facility] on 1/31-no HD yesterday.  Will plan for HD tomorrow as his oxygenation if OK and [facility] can't get to him until early am.  Added prn iv hydralazine/clonidine and scheduled clonidine  PT states he's been nauseated and vomitting with diarrhea for days-however, has been asking for food in the ed.   S/P paracentesis in 11/17- for 3.6 l/fluid  Dialyzing thru a PC,arm access removed   Needs epo-but bp needs to be improved before giving it  See previous note from last weekend at [facility] will not be accepting this pt at one of our hd units.  Cardiology to see--seen by dr. Francesco Runner in November at [facility]    IF I remember correctly he was supposed to have had a stool transplant for the cdiff at [place] last fall, but not done b/c prep was poor...it was discovered he had no pseudomembranes on colonoscopy so no future stool transplant was to be considered. (see CC notes from gi)    We will see again on Monday, call if problems arise on Sunday.   Asked to see for ESRD needs    Subjective:     Chief Complaint: My sister lives in [place] so that's why I come here. I've had nausea, vomitting and diarrhea.Went to [facility] and transferred here due to CP    Review of Systems: (+) n/v--no vomitting in ed, asking for food  (+) diarrhea/hx of cdiff--same complaint as last week at [facility]- last hd on wed at the hospital in [place].  No permanent HD unit at this point.   States he was dismissed from the unit b/c he was trying to work a full time job. Now he "gave up" his job and doesn't have a unit. Utilizes the hospital every week for HD.     Objective:     VITALS:   Last 24hrs VS reviewed since prior progress note. Most recent are:  Visit Vitals   ??? BP (!) 174/105 (BP 1 Location: Right arm, BP Patient Position: At rest)   ??? Pulse 88   ??? Temp 98.7 ??F (37.1 ??C)   ??? Resp 15   ??? Ht '6\' 1"'  (1.854 m)   ??? Wt 97.5 kg (215 lb)   ??? SpO2 97%   ??? BMI 28.37 kg/m2     SpO2 Readings from Last 6 Encounters:   05/22/16 97%   05/21/16 97%   05/17/16 97%   03/09/16 96%   02/12/16 100%   01/15/16 100%    O2 Flow Rate (L/min): 2 l/min   No intake or output data in the 24 hours ending 05/22/16 0651     Telemetry Reviewed   normal sinus rhythm    PHYSICAL EXAM:    General   well developed, well nourished, appears stated age, in no acute distress  Respiratory   Clear anteriorly  Cardiology  RRR  Abdominal  Soft, st.  distended  Extremities  No clubbing, cyanosis, or edema. Pulses intact.              Lab Data Reviewed: (see below)    Medications Reviewed: (see below)    PMH/SH reviewed - no change compared to H&P  ___________________________________________________    ___________________________________________________    Attending Physician: Lucretia Roers, MD     ____________________________________________________  MEDICATIONS:  Current Facility-Administered Medications   Medication Dose Route Frequency   ??? amLODIPine (NORVASC) tablet 10 mg  10 mg Oral DAILY   ??? carvedilol (COREG) tablet 12.5 mg  12.5 mg Oral BID WITH MEALS   ??? furosemide (LASIX) tablet 80 mg  80 mg Oral DAILY   ??? hydrALAZINE (APRESOLINE) tablet 100 mg  100 mg Oral TID   ??? pantoprazole (PROTONIX) tablet 40 mg  40 mg Oral DAILY   ??? sevelamer (RENAGEL) tablet 800 mg  800 mg Oral TID WITH MEALS   ??? sodium chloride (NS) flush 5-10 mL  5-10 mL IntraVENous Q8H   ??? sodium chloride (NS) flush 5-10 mL  5-10 mL IntraVENous PRN   ??? acetaminophen (TYLENOL) tablet 650 mg  650 mg Oral Q4H PRN   ??? nicotine (NICODERM CQ) 21 mg/24 hr patch 1 Patch  1 Patch TransDERmal Q24H    ??? heparin (porcine) injection 4,000 Units  41 Units/kg IntraVENous ONCE   ??? heparin 25,000 units in D5W 250 ml infusion  10-25 Units/kg/hr IntraVENous TITRATE   ??? albuterol-ipratropium (DUO-NEB) 2.5 MG-0.5 MG/3 ML  3 mL Nebulization Q6H PRN   ??? hydrALAZINE (APRESOLINE) 20 mg/mL injection 20 mg  20 mg IntraVENous Q6H PRN   ??? cloNIDine HCl (CATAPRES) tablet 0.1 mg  0.1 mg Oral Q4H PRN   ??? dicyclomine (BENTYL) capsule 20 mg  20 mg Oral NOW   ??? HYDROmorphone (PF) (DILAUDID) injection 1 mg  1 mg IntraVENous Q4H PRN   ??? ondansetron (ZOFRAN) injection 4 mg  4 mg IntraVENous Q4H PRN     Current Outpatient Prescriptions   Medication Sig   ??? albuterol (PROVENTIL HFA, VENTOLIN HFA, PROAIR HFA) 90 mcg/actuation inhaler Take 2 Puffs by inhalation every four (4) hours as needed for Wheezing.   ??? sevelamer (RENAGEL) 400 mg tablet Take 800 mg by mouth three (3) times daily (with meals).   ??? carvedilol (COREG) 12.5 mg tablet Take  by mouth two (2) times daily (with meals).   ??? hydrALAZINE (APRESOLINE) 100 mg tablet Take 100 mg by mouth three (3) times daily.   ??? amLODIPine (NORVASC) 10 mg tablet Take 10 mg by mouth daily.   ??? pantoprazole (PROTONIX) 40 mg tablet Take 40 mg by mouth daily.   ??? furosemide (LASIX) 80 mg tablet Take 80 mg by mouth daily.        LABS:  Recent Labs      05/22/16   0318  05/21/16   1708   WBC  5.5  6.0   HGB  7.3*  7.8*   HCT  23.7*  24.5*   PLT  103*  120*     Recent Labs      05/22/16   0318  05/21/16   2154  05/21/16   1708   NA   --   142  141   K   --   4.8  5.4*   CL   --   107  103   CO2   --   24  26   BUN   --  44*  46*   CREA   --   9.68*  9.48*   GLU   --   76  87   CA   --   7.2*  7.5*   MG  2.1   --    --    PHOS  4.9*   --    --      Recent Labs      05/22/16   0318  05/21/16   1708   SGOT   --   26   ALT   --   12   AP   --   248*   TBILI   --   0.4   TP   --   6.4   ALB   --   2.8*   GLOB   --   3.6   AML  260*   --    LPSE  1324*   --      Recent Labs      05/22/16   0318  05/21/16    1713   INR   --   1.2*   PTP   --   11.5*   APTT  32.6*   --       Recent Labs      05/22/16   0318   TIBC  217*   PSAT  53*   FERR  1676*      No results for input(s): PH, PCO2, PO2 in the last 72 hours.  Recent Labs      05/22/16   0318  05/21/16   2154  05/21/16   1708   CPK   --    --   579*   CKNDX   --    --   3.0*   TROIQ  0.06*  0.06*  0.07*     Lab Results   Component Value Date/Time    Glucose (POC) 96 03/04/2016 02:04 PM    Glucose (POC) 105 02/29/2016 11:05 AM

## 2016-05-22 NOTE — ED Notes (Signed)
Pt remains resting comfortably at this time.  Respirations even and unlabored with symmetrical chest rise on 3L per baseline.  Remains in NAD.

## 2016-05-22 NOTE — ED Notes (Signed)
Pt given crackers at this time per request.  Remains on cardiac monitor, VSS.

## 2016-05-22 NOTE — ED Notes (Signed)
US at bedside with pt.

## 2016-05-22 NOTE — ED Notes (Signed)
Pt medicated per MAR orders for pain.

## 2016-05-22 NOTE — Other (Signed)
Cardiac Rehab: Per EMR, patient is a current smoker. Smoking Cessation Program information placed on the AVS.    David Chaisson H Revella Shelton, RN

## 2016-05-22 NOTE — ED Notes (Signed)
TRANSFER - OUT REPORT:    [facility] on David Lindenroy D [name]  being transferred to 405(unit) for routine progression of care       Report consisted of patient???s Situation, Background, Assessment and   Recommendations(SBAR).     Information from the following report(s) ED Summary was reviewed with the receiving nurse.    Lines:   Peripheral IV 05/21/16 Right Arm (Active)   Site Assessment Clean, dry, & intact 05/22/2016  3:21 AM   Phlebitis Assessment 0 05/22/2016  3:21 AM   Infiltration Assessment 0 05/22/2016  3:21 AM   Dressing Status Clean, dry, & intact 05/22/2016  3:21 AM   Dressing Type Transparent 05/22/2016  3:21 AM       Peripheral IV 05/22/16 Left External jugular (Active)   Site Assessment Clean, dry, & intact 05/22/2016  3:22 AM   Phlebitis Assessment 0 05/22/2016  3:22 AM   Infiltration Assessment 0 05/22/2016  3:22 AM   Dressing Status Clean, dry, & intact 05/22/2016  3:22 AM   Dressing Type Transparent 05/22/2016  3:22 AM        Opportunity for questions and clarification was provided.      Patient transported with:   [employer]

## 2016-05-22 NOTE — Consults (Signed)
[facility] Consult Note        NAME: David [name]       DOB:  23-Jun-1970       MRN:  [id number]     Date/Time: 05/22/2016    Risk of deterioration: medium       Assessment:    Plan:  ESRD  HTN  CP-had stress 2017 at [facility]  N/V/Diarrhea-seems to be chronic  Anemia  Hx of Cdiff  Hx of hep b and ascites  HX of subclavian stenosis Pt discharged from [name] due to non compliance. HD at [facility] on 1/28-HD at [facility] on 1/31-no HD yesterday.  Will plan for HD tomorrow as his oxygenation if OK and [facility] can't get to him until early am.  Added prn iv hydralazine/clonidine and scheduled clonidine  PT states he's been nauseated and vomitting with diarrhea for days-however, has been asking for food in the ed.   S/P paracentesis in 11/17- for 3.6 l/fluid  Dialyzing thru a PC,arm access removed   Needs epo-but bp needs to be improved before giving it  See previous note from last weekend at [facility] will not be accepting this pt at one of our hd units.  Cardiology to see--seen by dr. Francesco Runner in November at [facility]    IF I remember correctly he was supposed to have had a stool transplant for the cdiff at [place] last fall, but not done b/c prep was poor...it was discovered he had no pseudomembranes on colonoscopy so no future stool transplant was to be considered. (see CC notes from gi)    We will see again on Monday, call if problems arise on Sunday.   Asked to see for ESRD needs    Subjective:     Chief Complaint: My sister lives in [place] so that's why I come here. I've had nausea, vomitting and diarrhea.Went to [facility] and transferred here due to CP    Review of Systems: (+) n/v--no vomitting in ed, asking for food  (+) diarrhea/hx of cdiff--same complaint as last week at [facility]- last hd on wed at the hospital in [place].  No permanent HD unit at this point.   States he was dismissed from the unit b/c he was trying to work a full time job. Now he "gave up" his job and doesn't have a unit. Utilizes the hospital every week for  HD.    Objective:     VITALS:   Last 24hrs VS reviewed since prior progress note. Most recent are:  Visit Vitals   ??? BP (!) 174/105 (BP 1 Location: Right arm, BP Patient Position: At rest)   ??? Pulse 88   ??? Temp 98.7 ??F (37.1 ??C)   ??? Resp 15   ??? Ht '6\' 1"'  (1.854 m)   ??? Wt 97.5 kg (215 lb)   ??? SpO2 97%   ??? BMI 28.37 kg/m2     SpO2 Readings from Last 6 Encounters:   05/22/16 97%   05/21/16 97%   05/17/16 97%   03/09/16 96%   02/12/16 100%   01/15/16 100%    O2 Flow Rate (L/min): 2 l/min   No intake or output data in the 24 hours ending 05/22/16 0651     Telemetry Reviewed   normal sinus rhythm    PHYSICAL EXAM:    General   well developed, well nourished, appears stated age, in no acute distress  Respiratory   Clear anteriorly  Cardiology  RRR  Abdominal  Soft, st.  distended  Extremities  No clubbing, cyanosis, or edema. Pulses intact.              Lab Data Reviewed: (see below)    Medications Reviewed: (see below)    PMH/SH reviewed - no change compared to H&P  ___________________________________________________    ___________________________________________________    Attending Physician: Lucretia Roers, MD     ____________________________________________________  MEDICATIONS:  Current Facility-Administered Medications   Medication Dose Route Frequency   ??? amLODIPine (NORVASC) tablet 10 mg  10 mg Oral DAILY   ??? carvedilol (COREG) tablet 12.5 mg  12.5 mg Oral BID WITH MEALS   ??? furosemide (LASIX) tablet 80 mg  80 mg Oral DAILY   ??? hydrALAZINE (APRESOLINE) tablet 100 mg  100 mg Oral TID   ??? pantoprazole (PROTONIX) tablet 40 mg  40 mg Oral DAILY   ??? sevelamer (RENAGEL) tablet 800 mg  800 mg Oral TID WITH MEALS   ??? sodium chloride (NS) flush 5-10 mL  5-10 mL IntraVENous Q8H   ??? sodium chloride (NS) flush 5-10 mL  5-10 mL IntraVENous PRN   ??? acetaminophen (TYLENOL) tablet 650 mg  650 mg Oral Q4H PRN   ??? nicotine (NICODERM CQ) 21 mg/24 hr patch 1 Patch  1 Patch TransDERmal Q24H   ??? heparin (porcine) injection 4,000 Units   41 Units/kg IntraVENous ONCE   ??? heparin 25,000 units in D5W 250 ml infusion  10-25 Units/kg/hr IntraVENous TITRATE   ??? albuterol-ipratropium (DUO-NEB) 2.5 MG-0.5 MG/3 ML  3 mL Nebulization Q6H PRN   ??? hydrALAZINE (APRESOLINE) 20 mg/mL injection 20 mg  20 mg IntraVENous Q6H PRN   ??? cloNIDine HCl (CATAPRES) tablet 0.1 mg  0.1 mg Oral Q4H PRN   ??? dicyclomine (BENTYL) capsule 20 mg  20 mg Oral NOW   ??? HYDROmorphone (PF) (DILAUDID) injection 1 mg  1 mg IntraVENous Q4H PRN   ??? ondansetron (ZOFRAN) injection 4 mg  4 mg IntraVENous Q4H PRN     Current Outpatient Prescriptions   Medication Sig   ??? albuterol (PROVENTIL HFA, VENTOLIN HFA, PROAIR HFA) 90 mcg/actuation inhaler Take 2 Puffs by inhalation every four (4) hours as needed for Wheezing.   ??? sevelamer (RENAGEL) 400 mg tablet Take 800 mg by mouth three (3) times daily (with meals).   ??? carvedilol (COREG) 12.5 mg tablet Take  by mouth two (2) times daily (with meals).   ??? hydrALAZINE (APRESOLINE) 100 mg tablet Take 100 mg by mouth three (3) times daily.   ??? amLODIPine (NORVASC) 10 mg tablet Take 10 mg by mouth daily.   ??? pantoprazole (PROTONIX) 40 mg tablet Take 40 mg by mouth daily.   ??? furosemide (LASIX) 80 mg tablet Take 80 mg by mouth daily.        LABS:  Recent Labs      05/22/16   0318  05/21/16   1708   WBC  5.5  6.0   HGB  7.3*  7.8*   HCT  23.7*  24.5*   PLT  103*  120*     Recent Labs      05/22/16   0318  05/21/16   2154  05/21/16   1708   NA   --   142  141   K   --   4.8  5.4*   CL   --   107  103   CO2   --   24  26   BUN   --  44*  46*   CREA   --   9.68*  9.48*   GLU   --   76  87   CA   --   7.2*  7.5*   MG  2.1   --    --    PHOS  4.9*   --    --      Recent Labs      05/22/16   0318  05/21/16   1708   SGOT   --   26   ALT   --   12   AP   --   248*   TBILI   --   0.4   TP   --   6.4   ALB   --   2.8*   GLOB   --   3.6   AML  260*   --    LPSE  1324*   --      Recent Labs      05/22/16   0318  05/21/16   1713   INR   --   1.2*   PTP   --   11.5*    APTT  32.6*   --       Recent Labs      05/22/16   0318   TIBC  217*   PSAT  53*   FERR  1676*      No results for input(s): PH, PCO2, PO2 in the last 72 hours.  Recent Labs      05/22/16   0318  05/21/16   2154  05/21/16   1708   CPK   --    --   579*   CKNDX   --    --   3.0*   TROIQ  0.06*  0.06*  0.07*     Lab Results   Component Value Date/Time    Glucose (POC) 96 03/04/2016 02:04 PM    Glucose (POC) 105 02/29/2016 11:05 AM

## 2016-05-22 NOTE — Consults (Signed)
CARDIOLOGY CONSULT                  Subjective:    Date of  Admission: 05/21/2016  8:48 PM     Admission type:Emergency    David [name] is a 46 y.o. male admitted for Chest pain. [name] has a long standing history of noncompliance with his HD and presented after not having HD for a few days. He has had episodes in the past of CP associated with this scenario. He went to [facility] for HD but was sent here for CP. His troponin was mildly elevated at 0.07->0.06. Chart review from here and other facilities show that he chronically runs a troponin which is mildly elevated at this level. He had been c/o of N/V/D but he was eating lung when I made rounds    Patient Active Problem List    Diagnosis Date Noted   ??? NSTEMI (non-ST elevated myocardial infarction) ([place]) 05/22/2016   ??? C. difficile colitis 05/15/2016   ??? Hyperkalemia 05/15/2016   ??? H/O noncompliance with medical treatment, presenting hazards to health 03/09/2016   ??? Diarrhea of infectious origin 03/09/2016   ??? Left arm swelling 03/09/2016   ??? Pain in left upper arm 03/09/2016   ??? Ascites 03/04/2016   ??? ESRD (end stage renal disease) on dialysis ([place]) 03/04/2016   ??? 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 Peritonitis ([place]) 11/21/2015   ??? Gastroenteritis 10/14/2015      None  Past Medical History:   Diagnosis Date   ??? Adverse effect of anesthesia     sleep apnea uses oxygen at night    ??? Ascites    ??? Asthma    ??? C. difficile colitis    ??? CKD (chronic kidney disease) stage V requiring chronic dialysis ([place])    ??? HTN (hypertension)    ??? SBP (spontaneous bacterial peritonitis) [facility])       Past Surgical History:   Procedure Laterality Date   ??? COLONOSCOPY N/A 03/04/2016    COLONOSCOPY performed by Macie Burows, MD at [facility]   ??? HX HERNIA REPAIR     ??? SIGMOIDOSCOPY,DIAGNOSTIC  03/04/2016          Allergies   Allergen Reactions   ??? Lisinopril Shortness of Breath   ??? Morphine Shortness of Breath   ??? Toradol [Ketorolac] Shortness of Breath       Family History   Problem Relation Age of Onset   ??? Hypertension Mother       Current Facility-Administered Medications   Medication Dose Route Frequency   ??? amLODIPine (NORVASC) tablet 10 mg  10 mg Oral DAILY   ??? carvedilol (COREG) tablet 12.5 mg  12.5 mg Oral BID WITH MEALS   ??? furosemide (LASIX) tablet 80 mg  80 mg Oral DAILY   ??? hydrALAZINE (APRESOLINE) tablet 100 mg  100 mg Oral TID   ??? pantoprazole (PROTONIX) tablet 40 mg  40 mg Oral DAILY   ??? sevelamer carbonate (RENVELA) tab 800 mg  800 mg Oral TID WITH MEALS   ??? sodium chloride (NS) flush 5-10 mL  5-10 mL IntraVENous Q8H   ??? sodium chloride (NS) flush 5-10 mL  5-10 mL IntraVENous PRN   ??? acetaminophen (TYLENOL) tablet 650 mg  650 mg Oral Q4H PRN   ??? nicotine (NICODERM CQ) 21 mg/24 hr patch 1 Patch  1 Patch TransDERmal Q24H   ??? albuterol-ipratropium (DUO-NEB) 2.5 MG-0.5 MG/3 ML  3 mL Nebulization  Q6H PRN   ??? hydrALAZINE (APRESOLINE) 20 mg/mL injection 20 mg  20 mg IntraVENous Q6H PRN   ??? cloNIDine HCl (CATAPRES) tablet 0.1 mg  0.1 mg Oral Q4H PRN   ??? heparin (porcine) injection 5,000 Units  5,000 Units SubCUTAneous Q12H   ??? cloNIDine HCl (CATAPRES) tablet 0.2 mg  0.2 mg Oral BID   ??? HYDROmorphone (PF) (DILAUDID) injection 1 mg  1 mg IntraVENous Q4H PRN   ??? ondansetron (ZOFRAN) injection 4 mg  4 mg IntraVENous Q4H PRN         Review of Symptoms:  Gen - no F/C/S  Eyes - no vision changes  ENT - no sore throat, rhinorrhea, otalgia  CV - + CP, no palpitations, no orthopnea, no PND, no LEE  Resp no cough, no SOB/DOE  GI - no AP, no n/v/d/c  GU - no dysuria, no hematuria  MSK - no abnormal joint pains  Skin - no rashes  Neuro - no HA, no numbness, no weakness, no slurred speech  Psych - no change in mood         Physical Exam    Visit Vitals   ??? BP (!) 196/107   ??? Pulse 87   ??? Temp 98.3 ??F (36.8 ??C)   ??? Resp 20   ??? Ht '6\' 1"'  (1.854 m)   ??? Wt 98.2 kg (216 lb 7.9 oz)   ??? SpO2 98%   ??? BMI 28.56 kg/m2     NAD  Skin warm and dry  Nl conjunctiva  Oropharynx without  exudate.    Neck supple  Lungs clear  Normal S1/ S2 with occasional ectopy  No Murmurs, click or Rubs  Abdomen soft and non tender  Pulses 2+ radials  Trace LEE  Neuro:  Grossly intact  Appropriate    Cardiographics    Telemetry: normal sinus rhythm  ECG: normal sinus rhythm, nonspecific ST and T waves changes      Labs:   Recent Results (from the past 24 hour(s))   EKG, 12 LEAD, INITIAL    Collection Time: 05/21/16  4:17 PM   Result Value Ref Range    Ventricular Rate 94 BPM    Atrial Rate 94 BPM    P-R Interval 180 ms    QRS Duration 86 ms    Q-T Interval 390 ms    QTC Calculation (Bezet) 487 ms    Calculated P Axis 66 degrees    Calculated R Axis -24 degrees    Calculated T Axis 88 degrees    Diagnosis       Normal sinus rhythm  Possible Left atrial enlargement  Prolonged QT  Abnormal ECG  When compared with ECG of 15-May-2016 16:06,  No significant change was found     CBC WITH AUTOMATED DIFF    Collection Time: 05/21/16  5:08 PM   Result Value Ref Range    WBC 6.0 4.1 - 11.1 K/uL    RBC 2.53 (L) 4.10 - 5.70 M/uL    HGB 7.8 (L) 12.1 - 17.0 g/dL    HCT 24.5 (L) 36.6 - 50.3 %    MCV 96.8 80.0 - 99.0 FL    MCH 30.8 26.0 - 34.0 PG    MCHC 31.8 30.0 - 36.5 g/dL    RDW 15.9 (H) 11.5 - 14.5 %    PLATELET 120 (L) 150 - 400 K/uL    MPV 9.8 8.9 - 12.9 FL    NRBC 0.0 0 PER 100 WBC  ABSOLUTE NRBC 0.00 0.00 - 0.01 K/uL    NEUTROPHILS 75 32 - 75 %    LYMPHOCYTES 8 (L) 12 - 49 %    MONOCYTES 6 5 - 13 %    EOSINOPHILS 11 (H) 0 - 7 %    BASOPHILS 0 0 - 1 %    IMMATURE GRANULOCYTES 0 0.0 - 0.5 %    ABS. NEUTROPHILS 4.4 1.8 - 8.0 K/UL    ABS. LYMPHOCYTES 0.5 (L) 0.8 - 3.5 K/UL    ABS. MONOCYTES 0.4 0.0 - 1.0 K/UL    ABS. EOSINOPHILS 0.7 (H) 0.0 - 0.4 K/UL    ABS. BASOPHILS 0.0 0.0 - 0.1 K/UL    ABS. IMM. GRANS. 0.0 0.00 - 0.04 K/UL    DF SMEAR SCANNED      RBC COMMENTS NORMOCYTIC, NORMOCHROMIC     METABOLIC PANEL, COMPREHENSIVE    Collection Time: 05/21/16  5:08 PM   Result Value Ref Range    Sodium 141 136 - 145 mmol/L     Potassium 5.4 (H) 3.5 - 5.1 mmol/L    Chloride 103 97 - 108 mmol/L    CO2 26 21 - 32 mmol/L    Anion gap 12 5 - 15 mmol/L    Glucose 87 65 - 100 mg/dL    BUN 46 (H) 6 - 20 MG/DL    Creatinine 9.48 (H) 0.70 - 1.30 MG/DL    BUN/Creatinine ratio 5 (L) 12 - 20      GFR est AA 7 (L) >60 ml/min/1.79m    GFR est non-AA 6 (L) >60 ml/min/1.773m   Calcium 7.5 (L) 8.5 - 10.1 MG/DL    Bilirubin, total 0.4 0.2 - 1.0 MG/DL    ALT (SGPT) 12 12 - 78 U/L    AST (SGOT) 26 15 - 37 U/L    Alk. phosphatase 248 (H) 45 - 117 U/L    Protein, total 6.4 6.4 - 8.2 g/dL    Albumin 2.8 (L) 3.5 - 5.0 g/dL    Globulin 3.6 2.0 - 4.0 g/dL    A-G Ratio 0.8 (L) 1.1 - 2.2     CK W/ CKMB & INDEX    Collection Time: 05/21/16  5:08 PM   Result Value Ref Range    CK 579 (H) 39 - 308 U/L    CK - MB 17.4 (H) <3.6 NG/ML    CK-MB Index 3.0 (H) 0 - 2.5     TROPONIN I    Collection Time: 05/21/16  5:08 PM   Result Value Ref Range    Troponin-I, Qt. 0.07 (H) <0.05 ng/mL   LACTIC ACID    Collection Time: 05/21/16  5:13 PM   Result Value Ref Range    Lactic acid 1.0 0.4 - 2.0 MMOL/L   PROTHROMBIN TIME + INR    Collection Time: 05/21/16  5:13 PM   Result Value Ref Range    INR 1.2 (H) 0.9 - 1.1      Prothrombin time 11.5 (H) 9.0 - 11.1 sec   EKG, 12 LEAD, INITIAL    Collection Time: 05/21/16  9:05 PM   Result Value Ref Range    Ventricular Rate 88 BPM    Atrial Rate 88 BPM    P-R Interval 194 ms    QRS Duration 86 ms    Q-T Interval 400 ms    QTC Calculation (Bezet) 484 ms    Calculated P Axis 63 degrees    Calculated R Axis -12 degrees  Calculated T Axis 79 degrees    Diagnosis       ** Poor data quality, interpretation may be adversely affected  Normal sinus rhythm  Prolonged QT  When compared with ECG of 21-May-2016 16:17,  MANUAL COMPARISON REQUIRED, DATA IS UNCONFIRMED     METABOLIC PANEL, BASIC    Collection Time: 05/21/16  9:54 PM   Result Value Ref Range    Sodium 142 136 - 145 mmol/L    Potassium 4.8 3.5 - 5.1 mmol/L    Chloride 107 97 - 108 mmol/L     CO2 24 21 - 32 mmol/L    Anion gap 11 5 - 15 mmol/L    Glucose 76 65 - 100 mg/dL    BUN 44 (H) 6 - 20 MG/DL    Creatinine 9.68 (H) 0.70 - 1.30 MG/DL    BUN/Creatinine ratio 5 (L) 12 - 20      GFR est AA 7 (L) >60 ml/min/1.68m    GFR est non-AA 6 (L) >60 ml/min/1.771m   Calcium 7.2 (L) 8.5 - 10.1 MG/DL   TROPONIN I    Collection Time: 05/21/16  9:54 PM   Result Value Ref Range    Troponin-I, Qt. 0.06 (H) <0.05 ng/mL   TSH 3RD GENERATION    Collection Time: 05/21/16  9:54 PM   Result Value Ref Range    TSH 1.98 0.36 - 3.74 uIU/mL   VITAMIN B12    Collection Time: 05/21/16  9:54 PM   Result Value Ref Range    Vitamin B12 294 211 - 911 pg/mL   FOLATE    Collection Time: 05/21/16  9:54 PM   Result Value Ref Range    Folate 8.1 5.0 - 21.0 ng/mL   POC EG7    Collection Time: 05/22/16  3:16 AM   Result Value Ref Range    Calcium, ionized (POC) 1.02 (L) 1.12 - 1.32 mmol/L    pH (POC) 7.335 (L) 7.35 - 7.45      pCO2 (POC) 47.0 (H) 35.0 - 45.0 MMHG    pO2 (POC) 28 (LL) 80 - 100 MMHG    HCO3 (POC) 25.1 22 - 26 MMOL/L    Base deficit (POC) 1 mmol/L    sO2 (POC) 48 (L) 92 - 97 %    Site OTHER      Device: ROOM AIR      Allens test (POC) N/A      Specimen type (POC) VENOUS BLOOD     CBC WITH AUTOMATED DIFF    Collection Time: 05/22/16  3:18 AM   Result Value Ref Range    WBC 5.5 4.1 - 11.1 K/uL    RBC 2.37 (L) 4.10 - 5.70 M/uL    HGB 7.3 (L) 12.1 - 17.0 g/dL    HCT 23.7 (L) 36.6 - 50.3 %    MCV 100.0 (H) 80.0 - 99.0 FL    MCH 30.8 26.0 - 34.0 PG    MCHC 30.8 30.0 - 36.5 g/dL    RDW 15.9 (H) 11.5 - 14.5 %    PLATELET 103 (L) 150 - 400 K/uL    MPV 9.6 8.9 - 12.9 FL    NRBC 0.0 0 PER 100 WBC    ABSOLUTE NRBC 0.00 0.00 - 0.01 K/uL    NEUTROPHILS 69 32 - 75 %    LYMPHOCYTES 12 12 - 49 %    MONOCYTES 7 5 - 13 %    EOSINOPHILS 11 (H) 0 - 7 %  BASOPHILS 1 0 - 1 %    IMMATURE GRANULOCYTES 0 0.0 - 0.5 %    ABS. NEUTROPHILS 3.7 1.8 - 8.0 K/UL    ABS. LYMPHOCYTES 0.7 (L) 0.8 - 3.5 K/UL    ABS. MONOCYTES 0.4 0.0 - 1.0 K/UL    ABS.  EOSINOPHILS 0.6 (H) 0.0 - 0.4 K/UL    ABS. BASOPHILS 0.1 0.0 - 0.1 K/UL    ABS. IMM. GRANS. 0.0 0.00 - 0.04 K/UL    DF SMEAR SCANNED      RBC COMMENTS ANISOCYTOSIS  1+        RBC COMMENTS MACROCYTOSIS  1+        RBC COMMENTS OVALOCYTES  PRESENT       PTT    Collection Time: 05/22/16  3:18 AM   Result Value Ref Range    aPTT 32.6 (H) 22.1 - 32.5 sec    aPTT, therapeutic range     58.0 - 77.0 SECS   PHOSPHORUS    Collection Time: 05/22/16  3:18 AM   Result Value Ref Range    Phosphorus 4.9 (H) 2.6 - 4.7 MG/DL   FERRITIN    Collection Time: 05/22/16  3:18 AM   Result Value Ref Range    Ferritin 1676 (H) 26 - 388 NG/ML   MAGNESIUM    Collection Time: 05/22/16  3:18 AM   Result Value Ref Range    Magnesium 2.1 1.6 - 2.4 mg/dL   LIPID PANEL    Collection Time: 05/22/16  3:18 AM   Result Value Ref Range    LIPID PROFILE          Cholesterol, total 125 <200 MG/DL    Triglyceride 42 <150 MG/DL    HDL Cholesterol 79 MG/DL    LDL, calculated 37.6 0 - 100 MG/DL    VLDL, calculated 8.4 MG/DL    CHOL/HDL Ratio 1.6 0 - 5.0     LIPASE    Collection Time: 05/22/16  3:18 AM   Result Value Ref Range    Lipase 1324 (H) 73 - 393 U/L   NT-PRO BNP    Collection Time: 05/22/16  3:18 AM   Result Value Ref Range    NT pro-BNP >30000 (H) 0 - 125 PG/ML   IRON PROFILE    Collection Time: 05/22/16  3:18 AM   Result Value Ref Range    Iron 116 35 - 150 ug/dL    TIBC 217 (L) 250 - 450 ug/dL    Iron % saturation 53 (H) 20 - 50 %   AMYLASE    Collection Time: 05/22/16  3:18 AM   Result Value Ref Range    Amylase 260 (H) 25 - 115 U/L   TROPONIN I    Collection Time: 05/22/16  3:18 AM   Result Value Ref Range    Troponin-I, Qt. 0.06 (H) <0.05 ng/mL   PTT    Collection Time: 05/22/16  9:20 AM   Result Value Ref Range    aPTT 34.5 (H) 22.1 - 32.5 sec    aPTT, therapeutic range     58.0 - 77.0 SECS        Assessment and Plan:    This is a 40 yom with ESRD, HD who presented with need for HD and transferred for CP. This presentation is congruent with a  presentation to [facility] a few months ago. Stress testing at that time was negative    No indication to check further troponins -> downtrending and no  change in clinical condition  Would not recommend ischemic evaluation, he had one recently and very doubtful this complaint is angina, his discomfort is more likely related to volume overload  Increased his carvediol  Added a now dose to the clonidine started by Dr. Beverley Fiedler anticoagulation with heparin as unlikely to be anginal and elevated troponin is not c/w NSTEMI    Thank you for this consult, please call with questions     Assessment:

## 2016-05-23 LAB — RENAL FUNCTION PANEL
Albumin: 2.9 g/dL — ABNORMAL LOW (ref 3.5–5.0)
Anion gap: 10 mmol/L (ref 5–15)
BUN/Creatinine ratio: 5 — ABNORMAL LOW (ref 12–20)
BUN: 59 MG/DL — ABNORMAL HIGH (ref 6–20)
CO2: 22 mmol/L (ref 21–32)
Calcium: 7.2 MG/DL — ABNORMAL LOW (ref 8.5–10.1)
Chloride: 105 mmol/L (ref 97–108)
Creatinine: 11.4 MG/DL — ABNORMAL HIGH (ref 0.70–1.30)
GFR est AA: 6 mL/min/{1.73_m2} — ABNORMAL LOW (ref >60)
GFR est non-AA: 5 mL/min/{1.73_m2} — ABNORMAL LOW (ref >60)
Glucose: 90 mg/dL (ref 65–100)
Phosphorus: 5.2 MG/DL — ABNORMAL HIGH (ref 2.6–4.7)
Potassium: 5.8 mmol/L — ABNORMAL HIGH (ref 3.5–5.1)
Sodium: 137 mmol/L (ref 136–145)

## 2016-05-23 LAB — CBC W/O DIFF
ABSOLUTE NRBC: 0 10*3/uL (ref 0.00–0.01)
HCT: 22.8 % — ABNORMAL LOW (ref 36.6–50.3)
HGB: 7.2 g/dL — ABNORMAL LOW (ref 12.1–17.0)
MCH: 31.4 PG (ref 26.0–34.0)
MCHC: 31.6 g/dL (ref 30.0–36.5)
MCV: 99.6 FL — ABNORMAL HIGH (ref 80.0–99.0)
MPV: 10.1 FL (ref 8.9–12.9)
NRBC: 0 PER 100 WBC
PLATELET: 110 10*3/uL — ABNORMAL LOW (ref 150–400)
RBC: 2.29 M/uL — ABNORMAL LOW (ref 4.10–5.70)
RDW: 16.2 % — ABNORMAL HIGH (ref 11.5–14.5)
WBC: 5.3 10*3/uL (ref 4.1–11.1)

## 2016-05-23 LAB — C. DIFFICILE (DNA): C. difficile (DNA): NEGATIVE

## 2016-05-23 LAB — MAGNESIUM: Magnesium: 2.2 mg/dL (ref 1.6–2.4)

## 2016-05-23 LAB — WBC, STOOL: White blood cells, stool: 0 /HPF (ref 0–4)

## 2016-05-23 MED ORDER — HYDROMORPHONE (PF) 1 MG/ML IJ SOLN
1 mg/mL | INTRAMUSCULAR | Status: DC | PRN
Start: 2016-05-23 — End: 2016-05-23
  Administered 2016-05-23 (×4): via INTRAVENOUS

## 2016-05-23 MED FILL — HYDRALAZINE 50 MG TAB: 50 mg | ORAL | Qty: 2

## 2016-05-23 MED FILL — ONDANSETRON (PF) 4 MG/2 ML INJECTION: 4 mg/2 mL | INTRAMUSCULAR | Qty: 2

## 2016-05-23 MED FILL — NORMAL SALINE FLUSH 0.9 % INJECTION SYRINGE: INTRAMUSCULAR | Qty: 10

## 2016-05-23 MED FILL — HYDROMORPHONE (PF) 1 MG/ML IJ SOLN: 1 mg/mL | INTRAMUSCULAR | Qty: 1

## 2016-05-23 MED FILL — NICOTINE 21 MG/24 HR DAILY PATCH: 21 mg/24 hr | TRANSDERMAL | Qty: 1

## 2016-05-23 MED FILL — SEVELAMER CARBONATE 800 MG TAB: 800 mg | ORAL | Qty: 1

## 2016-05-23 NOTE — Progress Notes (Signed)
Patient requesting to leave because he has "an emergency at home that needs to be taken care of."  BP is 160s/90s, K 5.8 and he has not yet had dialysis (now scheduled for 1300 today). He states he will get dialyzed at a [name] center. He was advised of the risk of stroke, fatal arrhythmias, heart attack with his BP and hyperkalemia but still signed out AMA. Nurse Marchelle Folksmanda was present.    Delon SacramentoSudha Heyward Douthit, MD, MHS

## 2016-05-23 NOTE — Progress Notes (Signed)
Problem: Falls - Risk of  Goal: *Absence of Falls  Document Schmid Fall Risk and appropriate interventions in the flowsheet.   Outcome: Progressing Towards Goal  Fall Risk Interventions:            Medication Interventions: Evaluate medications/consider consulting pharmacy, Patient to call before getting OOB       Bed in low position. Locked bed wheels. Call bell and personal items within reach. Side rails up x3. Hourly rounding performed.

## 2016-05-23 NOTE — Progress Notes (Signed)
0800  Dialysis paged. RN notified that dialysis will be here at 1pm. Pt notified. Still insistent on leaving. MD Koduru paged. Pt told the risks of leaving, including strokes and arrhythmias. Still insistent on leaving.    [email or web address]  MD Koduru at bedside. Pt again told the risks of leaving, including strokes and arrhythmias. Still insistent on leaving. Pt states "I will get dialysis in [place]". Pt signed AMA paperwork, his IVs were removed and he walked away to the elevators.    Hourly rounds performed.

## 2016-05-23 NOTE — Procedures (Signed)
[name]  *** FINAL REPORT ***    Name: David [name], David [name]  MRN: [id number]    Inpatient  DOB: 02 Dec 1970  HIS Order #: [id number]  TRAKnet Visit #: [id number]  Date: 22 May 2016    TYPE OF TEST: Peripheral Venous Testing    REASON FOR TEST  Shortness of breath    Right Leg:-  Deep venous thrombosis:           No  Superficial venous thrombosis:    No  Deep venous insufficiency:        Not examined  Superficial venous insufficiency: Not examined    Left Leg:-  Deep venous thrombosis:           No  Superficial venous thrombosis:    No  Deep venous insufficiency:        Not examined  Superficial venous insufficiency: Not examined      INTERPRETATION/FINDINGS  PROCEDURE:  Color duplex ultrasound imaging of lower extremity veins.    FINDINGS:       Right: The common femoral, deep femoral, femoral, popliteal,  posterior tibial, peroneal, and great saphenous are patent and without   evidence of thrombus;  each is fully compressible and there is no  narrowing of the flow channel on color Doppler imaging.  Phasic flow  is observed in the common femoral vein.       Left:   The common femoral, deep femoral, femoral, popliteal,  posterior tibial, peroneal, and great saphenous are patent and without   evidence of thrombus;  each is fully compressible and there is no  narrowing of the flow channel on color Doppler imaging.  Phasic flow  is observed in the common femoral vein.    IMPRESSION:  No evidence of right or left lower extremity vein  thrombosis.    ADDITIONAL COMMENTS    I have personally reviewed the data relevant to the interpretation of  this  study.    TECHNOLOGIST: James Kaczor, RVT  Signed: 05/22/2016 10:16 AM    PHYSICIAN: Keishla Oyer, MD  Signed: 05/24/2016 01:39 PM

## 2016-05-23 NOTE — Progress Notes (Signed)
0730: Patient exited room and called author to doorway. Patient states, [facility]". Patient questioned about his motivations and he states, "I have things I need to attend to and I will get dialysis in [name]". Explained to patient that dialysis centers are not typically open on Sunday, and he states, "I'll get it taken care of. I need to leave". Dr Colletta MarylandKoduru paged. [facility] paged for ETA. Primary RN, Marchelle FolksAmanda alerted.

## 2016-05-23 NOTE — Discharge Summary (Signed)
Discharge Summary       PATIENT ID: David [name]  MRN: [id number]   DATE OF BIRTH: [birth year]    DATE OF ADMISSION: 05/21/2016  8:48 PM    DATE OF DISCHARGE: 05/23/16   PRIMARY CARE PROVIDER: None     ATTENDING PHYSICIAN: Delon Sacramento, MD, MHS  DISCHARGING PROVIDER: Delon Sacramento, MD    To contact this individual call [phone] and ask the operator to page.  If unavailable ask to be transferred the Adult Hospitalist Department.    CONSULTATIONS: IP CONSULT TO NEPHROLOGY  IP CONSULT TO CARDIOLOGY    PROCEDURES/SURGERIES: * No surgery found *    ADMITTING DIAGNOSES & HOSPITAL COURSE:   46 yo man with ESRD on HD, HTN, h/o C diff diarrhea, chronic hypoxic respiratory failure on 2L O2 NC, asthma, anemia of ESRD, HBV and ascites, h/o subclavian stenosis was transferred from [facility] ED to [facility] ED on 05/21/16 for chest pain and missed HD. He was admitted for uncontrolled HTN, chronic recurrent noncardiac chest pain, and needing dialysis.     PATIENT HAS LONG H/O NONADHERENCE AND SIGNED OUT AMA. PATIENT IS A HIGH READMISSION RISK.     Chronic recurrent NONCARDIAC chest pain with chronically elevated troponin (POA)  - troponin at baseline  - likely due to fluid overload from missed HD x1 week  - Cardiology consulted; no further workup  - pain control; need to stop opiates  ??  ESRD on HD   - missed HD x1 week due to chronic nonadherence  - scheduled for HD today per [facility] but did not want to wait for AMA  - pt has no outpatient HD chair due to long h/o nonadherence; was let go by [name] [facility] center due to nonadherence  ??  Hyperkalemia (POA)   - likely due to missed HD  - resolved spontaneously  ??  HTN, uncontrolled - resumed home meds; needs HD but refused to wait for dialysis  ??  H/o C diff diarrhea  - reportedly evaluated for fecal transplant at one time but unable to get it  - C diff 2/3 negative; stool Cx 2/3 pending  ??  Anemia of ESRD - H/H stable  ??  Asthma/chronic hypoxic respiratory failure on 2L O2 NC - stable  ??   HBV with ascites - denied h/o HBV; acute hepatitis panel pending    DISCHARGE DIAGNOSES / PLAN:      Signed out AMA despite being informed of risks of stroke, MI, fatal arrhythmias, death.       PENDING TEST RESULTS:   At the time of discharge the following test results are still pending: acute hepatitis panel    FOLLOW UP APPOINTMENTS:  Left AMA  Follow-up Information     None         ADDITIONAL CARE RECOMMENDATIONS: none as patient signed out AMA    DISCHARGE MEDICATIONS: none as patient signed out AMA    Current Discharge Medication List        NOTIFY YOUR PHYSICIAN FOR ANY OF THE FOLLOWING:   Fever over 101 degrees for 24 hours.   Chest pain, shortness of breath, fever, chills, nausea, vomiting, diarrhea, change in mentation, falling, weakness, bleeding. Severe pain or pain not relieved by medications.  Or, any other signs or symptoms that you may have questions about.    DISPOSITION:    Home With:   OT  PT  HH  RN       Long term SNF/Inpatient Rehab  Independent/assisted living    Hospice   x Signed out AMA       PATIENT CONDITION AT DISCHARGE:     Functional status    Poor     Deconditioned    x Independent      Cognition   x  Lucid     Forgetful     Dementia      Catheters/lines (plus indication)    Foley     PICC     PEG    x None      Code status   x  Full code     DNR      PHYSICAL EXAMINATION AT DISCHARGE:  Visit Vitals   ??? BP (!) 161/98 (BP 1 Location: Right arm, BP Patient Position: At rest)   ??? Pulse 87   ??? Temp 97.7 ??F (36.5 ??C)   ??? Resp 20   ??? Ht 6\' 1"  (1.854 m)   ??? Wt 103 kg (227 lb 1.2 oz)   ??? SpO2 98%   ??? BMI 29.96 kg/m2     Constitutional:  awake, no acute distress, cooperative, 2L NC   ENT:  oral mucosa moist, oropharynx benign  Neck supple, no masses   Resp:  CTA bilaterally, no wheezing/rhonchi/rales, left chest wall permacath   CV:  regular rhythm, normal rate, no m/r/g appreciated, no peripheral edema    GI:  +BS, soft, non distended, diffusely tender      Musculoskeletal:  moves all extremities    Neurologic:  AAOx3, NFD                                             Skin:  warm, dry; tattoos  Eyes:  PERRL    Labs  Recent Results (from the past 24 hour(s))   CBC W/O DIFF    Collection Time: 05/23/16  4:07 AM   Result Value Ref Range    WBC 5.3 4.1 - 11.1 K/uL    RBC 2.29 (L) 4.10 - 5.70 M/uL    HGB 7.2 (L) 12.1 - 17.0 g/dL    HCT 16.122.8 (L) 09.636.6 - 50.3 %    MCV 99.6 (H) 80.0 - 99.0 FL    MCH 31.4 26.0 - 34.0 PG    MCHC 31.6 30.0 - 36.5 g/dL    RDW 04.516.2 (H) 40.911.5 - 14.5 %    PLATELET 110 (L) 150 - 400 K/uL    MPV 10.1 8.9 - 12.9 FL    NRBC 0.0 0 PER 100 WBC    ABSOLUTE NRBC 0.00 0.00 - 0.01 K/uL   RENAL FUNCTION PANEL    Collection Time: 05/23/16  4:07 AM   Result Value Ref Range    Sodium 137 136 - 145 mmol/L    Potassium 5.8 (H) 3.5 - 5.1 mmol/L    Chloride 105 97 - 108 mmol/L    CO2 22 21 - 32 mmol/L    Anion gap 10 5 - 15 mmol/L    Glucose 90 65 - 100 mg/dL    BUN 59 (H) 6 - 20 MG/DL    Creatinine 81.1911.40 (H) 0.70 - 1.30 MG/DL    BUN/Creatinine ratio 5 (L) 12 - 20      GFR est AA 6 (L) >60 ml/min/1.5373m2    GFR est non-AA 5 (L) >60 ml/min/1.2273m2    Calcium 7.2 (L) 8.5 -  10.1 MG/DL    Phosphorus 5.2 (H) 2.6 - 4.7 MG/DL    Albumin 2.9 (L) 3.5 - 5.0 g/dL   MAGNESIUM    Collection Time: 05/23/16  4:07 AM   Result Value Ref Range    Magnesium 2.2 1.6 - 2.4 mg/dL       CHRONIC MEDICAL DIAGNOSES:  Problem List as of 05/23/2016  Date Reviewed: 2016/06/17          Codes Class Noted - Resolved    * (Principal)NSTEMI (non-ST elevated myocardial infarction) (HCC) ICD-10-CM: I21.4  ICD-9-CM: 410.70  06/17/2016 - Present        C. difficile colitis ICD-10-CM: A04.72  ICD-9-CM: 008.45  05/15/2016 - Present        Hyperkalemia ICD-10-CM: E87.5  ICD-9-CM: 276.7  05/15/2016 - Present        H/O noncompliance with medical treatment, presenting hazards to health ICD-10-CM: Z91.19  ICD-9-CM: V15.81  03/09/2016 - Present        Diarrhea of infectious origin ICD-10-CM: A09   ICD-9-CM: 009.3  03/09/2016 - Present        Left arm swelling ICD-10-CM: M79.89  ICD-9-CM: 729.81  03/09/2016 - Present        Pain in left upper arm ICD-10-CM: Q46.962  ICD-9-CM: 729.5  03/09/2016 - Present        Ascites ICD-10-CM: R18.8  ICD-9-CM: 789.59  03/04/2016 - Present        ESRD (end stage renal disease) on dialysis [facility]) ICD-10-CM: N18.6, Z99.2  ICD-9-CM: 585.6, V45.11  03/04/2016 - Present        Recurrent Clostridium difficile diarrhea ICD-10-CM: A04.71  ICD-9-CM: 008.45  02/29/2016 - Present        Peritonitis (HCC) ICD-10-CM: K65.9  ICD-9-CM: 567.9  11/21/2015 - Present        Gastroenteritis ICD-10-CM: K52.9  ICD-9-CM: 558.9  10/14/2015 - Present            Signed:   Delon Sacramento, MD  05/23/2016  8:18 AM

## 2016-05-23 NOTE — Discharge Summary (Signed)
Discharge  Summary by Delon Sacramento at 05/23/16 1610                Author: Delon Sacramento  Service: Internal Medicine  Author Type: Physician       Filed: 05/23/16 1733  Date of Service: 05/23/16 0818  Status: Signed          Editor: Delon Sacramento                       Discharge Summary           PATIENT ID: David [name]   MRN: [id number]     DATE OF BIRTH: 05/14/1970      DATE OF ADMISSION: 05/21/2016  8:48 PM      DATE OF DISCHARGE:  05/23/16    PRIMARY CARE PROVIDER:  None       ATTENDING PHYSICIAN: Delon Sacramento, MD, [facility]   DISCHARGING PROVIDER:  Delon Sacramento, MD     To contact this individual call [phone] and ask the operator to page.  If unavailable ask to be transferred the Adult Hospitalist Department.      CONSULTATIONS: IP CONSULT TO NEPHROLOGY   IP CONSULT TO CARDIOLOGY      PROCEDURES/SURGERIES: * No surgery found *      ADMITTING DIAGNOSES & HOSPITAL  COURSE:    46 yo man with ESRD on HD, HTN, h/o C diff diarrhea, chronic hypoxic respiratory failure on 2L O2 NC, asthma, anemia of ESRD, HBV and ascites, h/o subclavian  stenosis was transferred from [facility] ED to [facility] ED on 05/21/16 for chest pain and missed HD. He was admitted for uncontrolled HTN, chronic recurrent noncardiac chest pain, and needing dialysis.       PATIENT HAS LONG H/O NONADHERENCE AND SIGNED OUT AMA. PATIENT IS A HIGH READMISSION RISK.       Chronic recurrent NONCARDIAC chest pain with chronically elevated troponin (POA)   - troponin at baseline   - likely due to fluid overload from missed HD x1 week   - Cardiology consulted; no further workup   - pain control; need to stop opiates   ??   ESRD on HD    - missed HD x1 week due to chronic nonadherence   - scheduled for HD today per Renal but did not want to wait for AMA   - pt has no outpatient HD chair due to long h/o nonadherence; was let go by [name] due to nonadherence   ??   Hyperkalemia (POA)    - likely due to missed HD   - resolved spontaneously   ??   HTN,  uncontrolled - resumed home meds; needs HD but refused to wait for dialysis   ??   H/o C diff diarrhea   - reportedly evaluated for fecal transplant at one time but unable to get it   - C diff 2/3 negative; stool Cx 2/3 pending   ??   Anemia of ESRD - H/H stable   ??   Asthma/chronic hypoxic respiratory failure on 2L O2 NC - stable   ??   HBV with ascites - denied h/o HBV; acute hepatitis panel pending        DISCHARGE DIAGNOSES / P LAN:           Signed out AMA despite being informed of risks of stroke, MI, fatal arrhythmias, death.           PENDING TEST RESULTS:  At the time of discharge the following test results are still pending: acute hepatitis panel      FOLLOW UP APPOINTMENTS:   Left AMA     Follow-up Information           None                ADDITIONAL CARE RECOMMENDATIONS: none as patient signed  out AMA      DISCHARGE MEDICATIONS: none as patient signed out AMA        Current Discharge Medication List               NOTIFY YOUR PHYSICIAN FOR ANY OF THE FOLLOWING:    Fever over 101 degrees for 24 hours.    Chest pain, shortness of breath, fever, chills, nausea, vomiting, diarrhea, change in mentation, falling, weakness, bleeding. Severe pain or pain not relieved by medications.   Or, any other signs or symptoms that you may have questions about.      DISPOSITION:         Home With:     OT    PT    HH    RN                   Long term SNF/Inpatient Rehab       Independent/assisted living          Hospice        x  Signed out AMA           PATIENT CONDITION AT DISCHARGE:       Functional status         Poor        Deconditioned         x  Independent         Cognition       x   Lucid        Forgetful           Dementia         Catheters/lines (plus indication)         Foley        PICC        PEG         x  None         Code status       x   Full code           DNR         PHYSICAL EXAMINATION AT DISCHARGE:     Visit Vitals         ?  BP  (!) 161/98 (BP 1 Location: Right arm, BP Patient Position: At rest)      ?  Pulse  87     ?  Temp  97.7 ??F (36.5 ??C)     ?  Resp  20     ?  Ht  6\' 1"  (1.854 m)     ?  Wt  103 kg (227 lb 1.2 oz)     ?  SpO2  98%         ?  BMI  29.96 kg/m2           Constitutional:   awake, no acute distress, cooperative, 2L NC     ENT:   oral mucosa moist, oropharynx benign   Neck supple, no masses     Resp:   CTA bilaterally, no wheezing/rhonchi/rales, left chest wall permacath     CV:  regular rhythm, normal rate, no m/r/g appreciated, no peripheral edema      GI:   +BS, soft, non distended, diffusely tender       Musculoskeletal:   moves all extremities      Neurologic:   AAOx3, NFD                                               Skin:  warm, dry; tattoos   Eyes:  PERRL      Labs     Recent Results (from the past 24 hour(s))     CBC W/O DIFF          Collection Time: 05/23/16  4:07 AM         Result  Value  Ref Range            WBC  5.3  4.1 - 11.1 K/uL       RBC  2.29 (L)  4.10 - 5.70 M/uL       HGB  7.2 (L)  12.1 - 17.0 g/dL       HCT  40.9 (L)  81.1 - 50.3 %       MCV  99.6 (H)  80.0 - 99.0 FL       MCH  31.4  26.0 - 34.0 PG       MCHC  31.6  30.0 - 36.5 g/dL       RDW  91.4 (H)  78.2 - 14.5 %       PLATELET  110 (L)  150 - 400 K/uL       MPV  10.1  8.9 - 12.9 FL       NRBC  0.0  0 PER 100 WBC       ABSOLUTE NRBC  0.00  0.00 - 0.01 K/uL       RENAL FUNCTION PANEL          Collection Time: 05/23/16  4:07 AM         Result  Value  Ref Range            Sodium  137  136 - 145 mmol/L       Potassium  5.8 (H)  3.5 - 5.1 mmol/L       Chloride  105  97 - 108 mmol/L       CO2  22  21 - 32 mmol/L       Anion gap  10  5 - 15 mmol/L       Glucose  90  65 - 100 mg/dL       BUN  59 (H)  6 - 20 MG/DL       Creatinine  95.62 (H)  0.70 - 1.30 MG/DL       BUN/Creatinine ratio  5 (L)  12 - 20         GFR est AA  6 (L)  >60 ml/min/1.65m2       GFR est non-AA  5 (L)  >60 ml/min/1.51m2       Calcium  7.2 (L)  8.5 - 10.1 MG/DL       Phosphorus  5.2 (H)  2.6 - 4.7 MG/DL       Albumin  2.9 (L)  3.5 - 5.0 g/dL        MAGNESIUM  Collection Time: 05/23/16  4:07 AM         Result  Value  Ref Range            Magnesium  2.2  1.6 - 2.4 mg/dL           CHRONIC MEDICAL DIAGNOSES:      Problem List as of 05/23/2016   Date Reviewed:  2016/06/16                Codes  Class  Noted - Resolved             * (Principal)NSTEMI (non-ST elevated myocardial infarction) (HCC)  ICD-10-CM: I21.4   ICD-9-CM: 410.70    06/16/16 - Present                       C. difficile colitis  ICD-10-CM: A04.72   ICD-9-CM: 008.45    05/15/2016 - Present                       Hyperkalemia  ICD-10-CM: E87.5   ICD-9-CM: 276.7    05/15/2016 - Present                       H/O noncompliance with medical treatment, presenting hazards to health  ICD-10-CM: Z91.19   ICD-9-CM: V15.81    03/09/2016 - Present                       Diarrhea of infectious origin  ICD-10-CM: A09   ICD-9-CM: 009.3    03/09/2016 - Present                       Left arm swelling  ICD-10-CM: M79.89   ICD-9-CM: 729.81    03/09/2016 - Present                       Pain in left upper arm  ICD-10-CM: Z61.096   ICD-9-CM: 729.5    03/09/2016 - Present                       Ascites  ICD-10-CM: R18.8   ICD-9-CM: 789.59    03/04/2016 - Present                       ESRD (end stage renal disease) on dialysis [facility])  ICD-10-CM: N18.6, Z99.2   ICD-9-CM: 585.6, V45.11    03/04/2016 - Present                       Recurrent Clostridium difficile diarrhea  ICD-10-CM: A04.71   ICD-9-CM: 008.45    02/29/2016 - Present                       Peritonitis (HCC)  ICD-10-CM: K65.9   ICD-9-CM: 567.9    11/21/2015 - Present                       Gastroenteritis  ICD-10-CM: K52.9   ICD-9-CM: 558.9    10/14/2015 - Present                       Signed:    Delon Sacramento, MD   05/23/2016   8:18 AM

## 2016-05-24 LAB — CULTURE, STOOL
Campylobacter antigen: NEGATIVE
Shiga toxin-producing E. coli Ag: NO GROWTH

## 2016-05-24 LAB — EKG, 12 LEAD, INITIAL
Atrial Rate: 94 {beats}/min
Calculated P Axis: 66 degrees
Calculated R Axis: -24 degrees
Calculated T Axis: 88 degrees
Diagnosis: NORMAL
P-R Interval: 180 ms
Q-T Interval: 390 ms
QRS Duration: 86 ms
QTC Calculation (Bezet): 487 ms
Ventricular Rate: 94 {beats}/min

## 2016-05-24 LAB — HEPATITIS PANEL, ACUTE
Hep B surface Ag Interp.: NEGATIVE
Hep C virus Ab Interp.: NONREACTIVE
Hepatitis A, IgM: NONREACTIVE
Hepatitis B core, IgM: NONREACTIVE
Hepatitis B surface Ag: <0.1 Index

## 2016-05-24 LAB — EKG 12-LEAD
Atrial Rate: 94 {beats}/min
Diagnosis: NORMAL
P Axis: 66 degrees
P-R Interval: 180 ms
Q-T Interval: 390 ms
QRS Duration: 86 ms
QTc Calculation (Bazett): 487 ms
R Axis: -24 degrees
T Axis: 88 degrees
Ventricular Rate: 94 {beats}/min

## 2016-05-24 NOTE — Procedures (Signed)
[facility]  *** FINAL REPORT ***    Name: David LimboMORGAN, Faizaan  MRN: [id number]    Inpatient  DOB: 02 Dec 1970  HIS Order #: [id number]  TRAKnet Visit #: [id number]  Date: 22 May 2016    TYPE OF TEST: Peripheral Venous Testing    REASON FOR TEST  Shortness of breath    Right Leg:-  Deep venous thrombosis:           No  Superficial venous thrombosis:    No  Deep venous insufficiency:        Not examined  Superficial venous insufficiency: Not examined    Left Leg:-  Deep venous thrombosis:           No  Superficial venous thrombosis:    No  Deep venous insufficiency:        Not examined  Superficial venous insufficiency: Not examined      INTERPRETATION/FINDINGS  PROCEDURE:  Color duplex ultrasound imaging of lower extremity veins.    FINDINGS:       Right: The common femoral, deep femoral, femoral, popliteal,  posterior tibial, peroneal, and great saphenous are patent and without   evidence of thrombus;  each is fully compressible and there is no  narrowing of the flow channel on color Doppler imaging.  Phasic flow  is observed in the common femoral vein.       Left:   The common femoral, deep femoral, femoral, popliteal,  posterior tibial, peroneal, and great saphenous are patent and without   evidence of thrombus;  each is fully compressible and there is no  narrowing of the flow channel on color Doppler imaging.  Phasic flow  is observed in the common femoral vein.    IMPRESSION:  No evidence of right or left lower extremity vein  thrombosis.    ADDITIONAL COMMENTS    I have personally reviewed the data relevant to the interpretation of  this  study.    TECHNOLOGIST: Bonnye FavaJames Kaczor, RVT  Signed: 05/22/2016 10:16 AM    PHYSICIAN: Lenn SinkAvik Modesto Ganoe, MD  Signed: 05/24/2016 01:39 PM

## 2016-05-25 LAB — OVA & PARASITES, STOOL, REFLEX

## 2016-05-25 LAB — OVA & PARASITES, STOOL

## 2016-05-26 ENCOUNTER — Ambulatory Visit (INDEPENDENT_AMBULATORY_CARE_PROVIDER_SITE_OTHER): Payer: Medicare Other | Admitting: Specialist

## 2016-06-16 ENCOUNTER — Emergency Department: Admit: 2016-06-17 | Payer: MEDICARE

## 2016-06-16 ENCOUNTER — Inpatient Hospital Stay

## 2016-06-16 DIAGNOSIS — A0471 Enterocolitis due to Clostridium difficile, recurrent: Secondary | ICD-10-CM

## 2016-06-16 NOTE — H&P (Addendum)
Hospitalist Admission NoteNAME: David [name]   DOB:  Mar 21, 1971   MRN:  [id number]     Date/Time:  06/16/2016 10:55 PM    Patient PCP: None  ______________________________________________________________________  Given the patient's current clinical presentation, I have a high level of concern for decompensation if discharged from the emergency department.  Complex decision making was performed, which includes reviewing the patient's available past medical records, laboratory results, and x-ray films.       My assessment of this patient's clinical condition and my plan of care is as follows.    Assessment / Plan:  ESRD on Dialysis, no HD in 1 week with Mild Pulmonary Edema, Volume Overload and Hyperkalemia  -admit to telemetry  -consult nephrology  -Kayexalate tonight    Diarrhea with history of C. diff  -PO flagyl to avoid IVF's given volume overload  -C. Diff sample sent from ED  -consider GI consult if C. Diff positive since patient with multiple relapses    Abdominal Pain:  Ascites: chronic, patient states that he even has history of Liver biopsy  -paracentesis ordered as I suspect this is the predominant source of his abdominal pain  -NPO (patient unable to tolerate PO anyway per his report)  -given his degree of abdominal pain will send for cell count and culture of ascitic fluid  -prn percocet for moderate pain and IV dilaudid prn for severe pain    Hypertension:  -hold home Norvasc since her BLE swelling  -continue home Coreg and Hydralazine  -continue NTG paste for now  -BP will improve after HD  -stop lasix as patient reports that he is anuric    Code Status: Full  Surrogate Decision Maker: Duwaine Maxin    DVT Prophylaxis: SCDs  GI Prophylaxis: not indicated    Baseline: Functional      Subjective:   CHIEF COMPLAINT: abdominal pain and diarrhea    HISTORY OF PRESENT ILLNESS:     David [name] is a 46 y.o.  African American male who presents with  abdominal pain and diarrhea. Patient has ESRD and is on HD and was previously fired from his dialysis unit for non-compliance. He has been in the hospital multiple times since and reports that he will not leave AMA this time. He reports worsening abdominal pain and attributes this to his C. Diff. He states that he has had C. Diff "11 times" and that he recently received antibiotics for PNA and the C. Diff came back. He reports 6-7 BM's daily for the last 5 days. He states that he is unable to keep anything down. His abdominal pains are sharp in nature. Patient also reports SOB and LE swelling. His last HD session was 1 week ago. He denies any CP.     In the ED patient received a CTA A/P which showed: "FINDINGS: Lung bases show cardiomegaly and bilateral small pleural reactions. There is anasarca. There is a moderate amount of ascites. Liver and spleen are normal in size. There is no free air. There is no bowel obstruction or definite wall thickening demonstrated. Vascular calcification, atrophic kidneys. The a pancreas appears unremarkable."    His CXR revealed mild pulmonary edema. Potsassium was measured at 5.5.    We were asked to admit for work up and evaluation of the above problems.     Past Medical History:   Diagnosis Date   ??? Adverse effect of anesthesia     sleep apnea uses oxygen at night    ??? Ascites    ???  Asthma    ??? C. difficile colitis    ??? CKD (chronic kidney disease) stage V requiring chronic dialysis ([facility])    ??? HTN (hypertension)    ??? SBP (spontaneous bacterial peritonitis) [facility])         Past Surgical History:   Procedure Laterality Date   ??? COLONOSCOPY N/A 03/04/2016    COLONOSCOPY performed by Macie Burows, MD at [facility]   ??? HX HERNIA REPAIR     ??? SIGMOIDOSCOPY,DIAGNOSTIC  03/04/2016            Social History   Substance Use Topics   ??? Smoking status: Heavy Tobacco Smoker     Packs/day: 0.50     Years: 10.00   ??? Smokeless tobacco: Never Used   ??? Alcohol use No        Family History    Problem Relation Age of Onset   ??? Hypertension Mother      Allergies   Allergen Reactions   ??? Lisinopril Shortness of Breath   ??? Morphine Shortness of Breath   ??? Toradol [Ketorolac] Shortness of Breath        Prior to Admission medications    Medication Sig Start Date End Date Taking? Authorizing Provider   sevelamer (RENAGEL) 400 mg tablet Take 800 mg by mouth three (3) times daily (with meals).    Phys Other, MD   carvedilol (COREG) 12.5 mg tablet Take  by mouth two (2) times daily (with meals).    Phys Other, MD   hydrALAZINE (APRESOLINE) 100 mg tablet Take 100 mg by mouth three (3) times daily.    Phys Other, MD   amLODIPine (NORVASC) 10 mg tablet Take 10 mg by mouth daily.    Phys Other, MD   pantoprazole (PROTONIX) 40 mg tablet Take 40 mg by mouth daily.    Phys Other, MD   furosemide (LASIX) 80 mg tablet Take 80 mg by mouth daily.    Phys Other, MD   albuterol (PROVENTIL HFA, VENTOLIN HFA, PROAIR HFA) 90 mcg/actuation inhaler Take 2 Puffs by inhalation every four (4) hours as needed for Wheezing.    Phys Other, MD       REVIEW OF SYSTEMS:     A complete 14 point ROS was reviewed and is negative other than stated as per HPI.      Objective:   VITALS:    Visit Vitals   ??? BP (!) 166/107   ??? Pulse 93   ??? Temp 98.2 ??F (36.8 ??C)   ??? Resp 20   ??? Ht '6\' 1"'  (1.854 m)   ??? Wt 104.1 kg (229 lb 8 oz)   ??? SpO2 97%   ??? BMI 30.28 kg/m2       PHYSICAL EXAM:    General:    Alert, cooperative, no distress, appears stated age.     HEENT: Atraumatic, anicteric sclerae, pink conjunctivae     No oral ulcers, mucosa dry, o/p clear  Neck:  Supple, symmetrical,  Thyroid not enlarged  Lungs:   Mild rales on posterior assessment without wheezing or rhonchi.   Chest wall:  No tenderness  No Accessory muscle use at rest.  Heart:   Regular  rhythm,  No  murmur   1+ BLE edema  Abdomen:   Voluntary guarding before meaningful palpation could occur, + distended.  Bowel sounds present  Extremities: No cyanosis.  No clubbing, Skin turgor normal   Skin:     Not pale.  Not  Jaundiced  No rashes   Psych:  Fair insight.  Not depressed.  Not anxious or agitated.  Neurologic: EOMs intact. No facial asymmetry. No aphasia or slurred speech. No focal neurologic deficits. Alert and oriented X 4.     _______________________________________________________________________  Care Plan discussed with:    Comments   Patient x    Family      RN     Care Manager                    Consultant:  x ED physician   _______________________________________________________________________  Expected  Disposition:   Home with Family x   HH/PT/OT/RN    SNF/LTC    SAHR    ________________________________________________________________________  TOTAL TIME:  83 Minutes    Critical Care Provided     Minutes non procedure based      Comments    x Reviewed previous records   >50% of visit spent in counseling and coordination of care x Discussion with patient and/or family and questions answered       ________________________________________________________________________  Signed: Donnella Bi, MD    Procedures: see electronic medical records for all procedures/Xrays and details which were not copied into this note but were reviewed prior to creation of Plan.    LAB DATA REVIEWED:    Recent Results (from the past 24 hour(s))   CBC WITH AUTOMATED DIFF    Collection Time: 06/16/16  8:10 PM   Result Value Ref Range    WBC 6.3 4.1 - 11.1 K/uL    RBC 3.02 (L) 4.10 - 5.70 M/uL    HGB 9.0 (L) 12.1 - 17.0 g/dL    HCT 29.1 (L) 36.6 - 50.3 %    MCV 96.4 80.0 - 99.0 FL    MCH 29.8 26.0 - 34.0 PG    MCHC 30.9 30.0 - 36.5 g/dL    RDW 17.6 (H) 11.5 - 14.5 %    PLATELET 146 (L) 150 - 400 K/uL    MPV 9.9 8.9 - 12.9 FL    NRBC 0.0 0 PER 100 WBC    ABSOLUTE NRBC 0.00 0.00 - 0.01 K/uL    NEUTROPHILS 76 (H) 32 - 75 %    LYMPHOCYTES 10 (L) 12 - 49 %    MONOCYTES 6 5 - 13 %    EOSINOPHILS 7 0 - 7 %    BASOPHILS 1 0 - 1 %    IMMATURE GRANULOCYTES 0 0.0 - 0.5 %    ABS. NEUTROPHILS 4.8 1.8 - 8.0 K/UL     ABS. LYMPHOCYTES 0.6 (L) 0.8 - 3.5 K/UL    ABS. MONOCYTES 0.4 0.0 - 1.0 K/UL    ABS. EOSINOPHILS 0.4 0.0 - 0.4 K/UL    ABS. BASOPHILS 0.1 0.0 - 0.1 K/UL    ABS. IMM. GRANS. 0.0 0.00 - 0.04 K/UL    DF SMEAR SCANNED      RBC COMMENTS NORMOCYTIC, NORMOCHROMIC     METABOLIC PANEL, COMPREHENSIVE    Collection Time: 06/16/16  8:10 PM   Result Value Ref Range    Sodium 137 136 - 145 mmol/L    Potassium 5.5 (H) 3.5 - 5.1 mmol/L    Chloride 104 97 - 108 mmol/L    CO2 20 (L) 21 - 32 mmol/L    Anion gap 13 5 - 15 mmol/L    Glucose 110 (H) 65 - 100 mg/dL    BUN 63 (H) 6 - 20 MG/DL    Creatinine 12.00 (  H) 0.70 - 1.30 MG/DL    BUN/Creatinine ratio 5 (L) 12 - 20      GFR est AA 6 (L) >60 ml/min/1.72m    GFR est non-AA 5 (L) >60 ml/min/1.723m   Calcium 7.3 (L) 8.5 - 10.1 MG/DL    Bilirubin, total 0.4 0.2 - 1.0 MG/DL    ALT (SGPT) 19 12 - 78 U/L    AST (SGOT) 22 15 - 37 U/L    Alk. phosphatase 330 (H) 45 - 117 U/L    Protein, total 7.2 6.4 - 8.2 g/dL    Albumin 3.1 (L) 3.5 - 5.0 g/dL    Globulin 4.1 (H) 2.0 - 4.0 g/dL    A-G Ratio 0.8 (L) 1.1 - 2.2

## 2016-06-16 NOTE — ED Notes (Signed)
TRANSFER - OUT REPORT:    Verbal report given to Dahlia ClientHannah, RN(name) on David [name]  being transferred to Renal(unit) for routine progression of care       Report consisted of patient???s Situation, Background, Assessment and   Recommendations(SBAR).     Information from the following report(s) SBAR, ED Summary and Recent Results was reviewed with the receiving nurse.    Lines:   Peripheral IV 06/16/16 Right Hand (Active)   Site Assessment Clean, dry, & intact 06/16/2016  8:14 PM   Phlebitis Assessment 0 06/16/2016  8:14 PM   Infiltration Assessment 0 06/16/2016  8:14 PM   Dressing Status Clean, dry, & intact 06/16/2016  8:14 PM   Dressing Type Transparent;Tape 06/16/2016  8:14 PM   Hub Color/Line Status Blue;Flushed 06/16/2016  8:14 PM   Action Taken Blood drawn 06/16/2016  8:14 PM        Opportunity for questions and clarification was provided.      Patient transported with:   [employer]

## 2016-06-16 NOTE — ED Notes (Signed)
Pt to CT via stretcher.

## 2016-06-16 NOTE — ED Triage Notes (Addendum)
Pt arrived via wheelchair from triage with cc abd pain, N/V/D. Pt reports he was being treated for PNA and finished abx 2 weeks ago. Per pt he believes he has C-Diff again based on the smell of his stool. Per pt he also had a fever yesterday. Pt has M,W,F dialysis though has not gone since last Wednesday d/t not feeling well. Per pt has has also not taken any of his home medications because he is unable to keep anything down. Pt in no acute distress. VSS.

## 2016-06-16 NOTE — ED Triage Notes (Addendum)
HD patient, Has [facility] access, unable to use left arm: anuric. Last HD on Friday. Has not taken medications since Friday.

## 2016-06-16 NOTE — ED Provider Notes (Signed)
EMERGENCY DEPARTMENT HISTORY AND PHYSICAL EXAM      Date: 06/16/2016  Patient Name: David [name]    History of Presenting Illness     Chief Complaint   Patient presents with   ??? Abdominal Pain     Since Friday "from left to right side." Decreased PO intake. Reports sharp pain.    ??? Nausea   ??? Diarrhea     "I think I have C-diff again" Reports 7 episodes. Denies blood in stools.    ??? Vomiting     x5 since friday, denies blood in emesis.        History Provided By: Patient and medical records    HPI: David [name], 46 y.o. male with PMHx significant for HTN, ascites, and ESRD on dialysis MWF, presents via EMS to the ED with cc of 10/10 gradually worsening, diffuse abdominal pain with associated nausea, vomiting, and multiple episodes of diarrhea x 6 days. He states that he has a history of C.diff, and his current symptoms are similar. He states that his stool has similar to prior C.diff. He notes that he has not completed dialysis in 1 week due to his job and multiple episodes of diarrhea in the past week. He notes a history of ascites with weekly paracenteses. Per medical records, the patient was diagnosed with PNA 2 weeks ago, and he was prescribed ABX. He denies a history of bowel obstruction or bowel perforation. He also denies a known history of heart failure. He specifically denies fevers or other complaints at this time.    There are no other complaints, changes, or physical findings at this time.    PCP: None    Current Facility-Administered Medications   Medication Dose Route Frequency Provider Last Rate Last Dose   ??? nitroglycerin (NITROBID) 2 % ointment 1 Inch  1 Inch Topical BID Vernell Morgans, MD   1 Inch at 06/16/16 2050   ??? metroNIDAZOLE (FLAGYL) tablet 500 mg  500 mg Oral NOW Vernell Morgans, MD         Current Outpatient Prescriptions   Medication Sig Dispense Refill   ??? sevelamer (RENAGEL) 400 mg tablet Take 800 mg by mouth three (3) times daily (with meals).      ??? carvedilol (COREG) 12.5 mg tablet Take  by mouth two (2) times daily (with meals).     ??? hydrALAZINE (APRESOLINE) 100 mg tablet Take 100 mg by mouth three (3) times daily.     ??? amLODIPine (NORVASC) 10 mg tablet Take 10 mg by mouth daily.     ??? pantoprazole (PROTONIX) 40 mg tablet Take 40 mg by mouth daily.     ??? furosemide (LASIX) 80 mg tablet Take 80 mg by mouth daily.     ??? albuterol (PROVENTIL HFA, VENTOLIN HFA, PROAIR HFA) 90 mcg/actuation inhaler Take 2 Puffs by inhalation every four (4) hours as needed for Wheezing.         Past History     Past Medical History:  Past Medical History:   Diagnosis Date   ??? Adverse effect of anesthesia     sleep apnea uses oxygen at night    ??? Ascites    ??? Asthma    ??? C. difficile colitis    ??? CKD (chronic kidney disease) stage V requiring chronic dialysis ([place])    ??? HTN (hypertension)    ??? SBP (spontaneous bacterial peritonitis) ([place])        Past Surgical History:  Past Surgical History:  Procedure Laterality Date   ??? COLONOSCOPY N/A 03/04/2016    COLONOSCOPY performed by Macie Burows, MD at [facility]   ??? HX HERNIA REPAIR     ??? SIGMOIDOSCOPY,DIAGNOSTIC  03/04/2016            Family History:  Family History   Problem Relation Age of Onset   ??? Hypertension Mother        Social History:  Social History   Substance Use Topics   ??? Smoking status: Heavy Tobacco Smoker     Packs/day: 0.50     Years: 10.00   ??? Smokeless tobacco: Never Used   ??? Alcohol use No       Allergies:  Allergies   Allergen Reactions   ??? Lisinopril Shortness of Breath   ??? Morphine Shortness of Breath   ??? Toradol [Ketorolac] Shortness of Breath       Review of Systems   Review of Systems   Constitutional: Negative for chills and fever.   Respiratory: Negative for cough and shortness of breath.    Cardiovascular: Negative for chest pain.   Gastrointestinal: Positive for abdominal pain, diarrhea, nausea and vomiting. Negative for constipation.   Neurological: Negative for weakness and numbness.    All other systems reviewed and are negative.      Physical Exam   Physical Exam   Constitutional: He is oriented to person, place, and time. He appears well-developed and well-nourished.   HENT:   Head: Normocephalic and atraumatic.   Eyes: Conjunctivae and EOM are normal.   Neck: Normal range of motion. Neck supple.   Cardiovascular: Normal rate and regular rhythm.    Pulmonary/Chest: Effort normal. No respiratory distress.   Soft crackles diffusely.   Abdominal: Soft. He exhibits no distension. There is generalized tenderness.   Musculoskeletal: Normal range of motion.   Neurological: He is alert and oriented to person, place, and time.   Skin: Skin is warm and dry.   Psychiatric: He has a normal mood and affect.   Nursing note and vitals reviewed.      Diagnostic Study Results   Labs -     Recent Results (from the past 12 hour(s))   CBC WITH AUTOMATED DIFF    Collection Time: 06/16/16  8:10 PM   Result Value Ref Range    WBC 6.3 4.1 - 11.1 K/uL    RBC 3.02 (L) 4.10 - 5.70 M/uL    HGB 9.0 (L) 12.1 - 17.0 g/dL    HCT 29.1 (L) 36.6 - 50.3 %    MCV 96.4 80.0 - 99.0 FL    MCH 29.8 26.0 - 34.0 PG    MCHC 30.9 30.0 - 36.5 g/dL    RDW 17.6 (H) 11.5 - 14.5 %    PLATELET 146 (L) 150 - 400 K/uL    MPV 9.9 8.9 - 12.9 FL    NRBC 0.0 0 PER 100 WBC    ABSOLUTE NRBC 0.00 0.00 - 0.01 K/uL    NEUTROPHILS 76 (H) 32 - 75 %    LYMPHOCYTES 10 (L) 12 - 49 %    MONOCYTES 6 5 - 13 %    EOSINOPHILS 7 0 - 7 %    BASOPHILS 1 0 - 1 %    IMMATURE GRANULOCYTES 0 0.0 - 0.5 %    ABS. NEUTROPHILS 4.8 1.8 - 8.0 K/UL    ABS. LYMPHOCYTES 0.6 (L) 0.8 - 3.5 K/UL    ABS. MONOCYTES 0.4 0.0 - 1.0 K/UL  ABS. EOSINOPHILS 0.4 0.0 - 0.4 K/UL    ABS. BASOPHILS 0.1 0.0 - 0.1 K/UL    ABS. IMM. GRANS. 0.0 0.00 - 0.04 K/UL    DF SMEAR SCANNED      RBC COMMENTS NORMOCYTIC, NORMOCHROMIC     METABOLIC PANEL, COMPREHENSIVE    Collection Time: 06/16/16  8:10 PM   Result Value Ref Range    Sodium 137 136 - 145 mmol/L    Potassium 5.5 (H) 3.5 - 5.1 mmol/L     Chloride 104 97 - 108 mmol/L    CO2 20 (L) 21 - 32 mmol/L    Anion gap 13 5 - 15 mmol/L    Glucose 110 (H) 65 - 100 mg/dL    BUN 63 (H) 6 - 20 MG/DL    Creatinine 12.00 (H) 0.70 - 1.30 MG/DL    BUN/Creatinine ratio 5 (L) 12 - 20      GFR est AA 6 (L) >60 ml/min/1.44m    GFR est non-AA 5 (L) >60 ml/min/1.768m   Calcium 7.3 (L) 8.5 - 10.1 MG/DL    Bilirubin, total 0.4 0.2 - 1.0 MG/DL    ALT (SGPT) 19 12 - 78 U/L    AST (SGOT) 22 15 - 37 U/L    Alk. phosphatase 330 (H) 45 - 117 U/L    Protein, total 7.2 6.4 - 8.2 g/dL    Albumin 3.1 (L) 3.5 - 5.0 g/dL    Globulin 4.1 (H) 2.0 - 4.0 g/dL    A-G Ratio 0.8 (L) 1.1 - 2.2         Radiologic Studies -     CT Results  (Last 48 hours)               06/16/16 2105  CT ABD PELV WO CONT Final result    Impression:   impression: Anasarca and ascites.       Narrative:  EXAM:  CT ABD PELV WO CONT       INDICATION: Abdominal pain; concern c diff colitis       COMPARISON: February 3. Lung bases are unchanged with cardiomegaly and   interstitial prominence.       CONTRAST:  None.       TECHNIQUE:    Thin axial images were obtained through the abdomen and pelvis. Coronal and   sagittal reconstructions were generated. Oral contrast was not administered. CT   dose reduction was achieved through use of a standardized protocol tailored for   this examination and automatic exposure control for dose modulation.        The absence of intravenous contrast material reduces the sensitivity for   evaluation of the solid parenchymal organs of the abdomen.        FINDINGS:    Lung bases show cardiomegaly and bilateral small pleural reactions. There is   anasarca. There is a moderate amount of ascites. Liver and spleen are normal in   size. There is no free air. There is no bowel obstruction or definite wall   thickening demonstrated. Vascular calcification, atrophic kidneys. The a   pancreas appears unremarkable.               CXR Results  (Last 48 hours)                06/16/16 2044  XR CHEST PORT Final result    Impression:  IMPRESSION:       Increased mild interstitial pulmonary edema. No pneumothorax.  Narrative:  EXAM:  XR CHEST PORT       INDICATION:  End-stage renal disease on dialysis. Missed dialysis. Abdominal   pain and diarrhea. Previous Clostridium difficile.       COMPARISON: Portable chest on 05/21/2016       TECHNIQUE: Upright portable chest AP digital view       FINDINGS: [Dialysis catheter is unchanged. Right subclavian vascular stent is   unchanged. Cardiomegaly and aortic atherosclerosis are unchanged. The pulmonary   vasculature is within normal limits.        Mild interstitial pulmonary edema is increased. Probable small bilateral pleural   effusions. No pneumothorax. No evidence of pneumonia. The visualized bones and   upper abdomen are age-appropriate.                 Medical Decision Making   I am the first provider for this patient.    I reviewed the vital signs, available nursing notes, past medical history, past surgical history, family history and social history.    Vital Signs-Reviewed the patient's vital signs.  Patient Vitals for the past 12 hrs:   Temp Pulse Resp BP SpO2   06/16/16 2210 - - - (!) 168/113 96 %   06/16/16 2200 - - - (!) 159/101 -   06/16/16 2130 - - - (!) 180/119 98 %   06/16/16 2115 - - - (!) 195/114 99 %   06/16/16 2104 - - - (!) 193/124 -   06/16/16 2045 - - - (!) 187/111 -   06/16/16 1953 98.2 ??F (36.8 ??C) 93 20 (!) 205/117 100 %       Records Reviewed: Nursing Notes, Old Medical Records, Previous Radiology Studies and Previous Laboratory Studies    Provider Notes (Medical Decision Making):  Patient presents with abdominal pain and multiple episodes of diarrhea.  Concern for diarrhea. DDx: Gastroenteritis, SBO, appendicitis, colitis, IBD, diverticulitis, mesenteric ischemia, AAA or descending dissection, ACS, ureteral stone. Will get labs and CT Abdomen. Given missed dialysis,  will get cxray to rule out emergent need for dialysis.    ED Course:   Initial assessment performed. The patients presenting problems have been discussed, and they are in agreement with the care plan formulated and outlined with them.  I have encouraged them to ask questions as they arise throughout their visit.    Progress Note:  9:42 PM  The patient's cxray is concerning for pulmonary edema. Pt was updated on his available results, and his lab results are at baseline. Pt's CT shows no signs of obstruction or perforation. Will ambulate patient to determine if his SpO2 decreases. The patient states that he was recently discharged from his dialysis service due to non-compliance. He reports missing too many appointments due to his job/symtpoms. Will admit to hospitalist given lack of follow up.  Written by Earle Gell, ED Scribe, as dictated by Vernell Morgans, MD.    Progress Note:  10:16 PM  The patient was ambulated, and his SpO2 decreased to 93-94% on room air, and he became very dyspneic.  Written by Earle Gell, ED Scribe, as dictated by Vernell Morgans, MD.    CONSULT NOTE:   10:45 PM  Vernell Morgans, MD spoke with Donnella Bi, MD,   Specialty: Hospitalist  Discussed pt's hx, disposition, and available diagnostic and imaging results. Reviewed care plans. Consultant will evaluate pt for admission.  Written by Earle Gell, ED Scribe, as dictated by Vernell Morgans, MD.    Critical Care Time: 0 minutes  Admit Note:  10:45 PM  Pt is being admitted by Donnella Bi, MD. The results of their tests and reason(s) for their admission have been discussed with pt and/or available family. They convey agreement and understanding for the need to be admitted and for admission diagnosis.    Diagnosis     Clinical Impression:   1. Diarrhea of presumed infectious origin    2. Pulmonary edema, acute ([place])    3. Noncompliance        Attestations:  This note is prepared by Earle Gell, acting as a Scribe for Vernell Morgans, MD, PA-C    Vernell Morgans, MD: The scribe's documentation has been prepared under my direction and personally reviewed by me in its entirety. I confirm that the notes above accurately reflects all work, treatment, procedures, and medical decision making performed by me.        This note will not be viewable in [place].

## 2016-06-16 NOTE — ED Notes (Signed)
Dr. Sequeira at bedside to evaluate patient.

## 2016-06-16 NOTE — ED Notes (Signed)
Pt ambulatory to restroom with steady gait to provide stool sample.

## 2016-06-16 NOTE — Other (Signed)
Primary Nurse Charlyn MinervaHannah D Overschmidt and Kathee PoliteBrian Strader, RN performed a dual skin assessment on this patient No impairment noted  Braden score is 20.    Pt has quinton L chest.  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  No other skin impairments noted.

## 2016-06-16 NOTE — ED Notes (Signed)
Pt ambulatory to restroom with steady gait.

## 2016-06-16 NOTE — ED Notes (Signed)
Pt ambulatory approx 15 ft on pulse ox, SpO2 ranged from 96-94% on room air. Pt reports feeling very SOB and requested to go back to room. MD notified.

## 2016-06-16 NOTE — ED Notes (Signed)
Pt back from CT.

## 2016-06-17 ENCOUNTER — Inpatient Hospital Stay: Payer: MEDICARE

## 2016-06-17 ENCOUNTER — Inpatient Hospital Stay
Admit: 2016-06-17 | Discharge: 2016-06-22 | Disposition: A | Discharge status: Other Acute Inpatient Hospital | Payer: MEDICARE | Attending: Internal Medicine | Admitting: Internal Medicine

## 2016-06-17 LAB — METABOLIC PANEL, COMPREHENSIVE
A-G Ratio: 0.8 — ABNORMAL LOW (ref 1.1–2.2)
ALT (SGPT): 19 U/L (ref 12–78)
AST (SGOT): 22 U/L (ref 15–37)
Albumin: 3.1 g/dL — ABNORMAL LOW (ref 3.5–5.0)
Alk. phosphatase: 330 U/L — ABNORMAL HIGH (ref 45–117)
Anion gap: 13 mmol/L (ref 5–15)
BUN/Creatinine ratio: 5 — ABNORMAL LOW (ref 12–20)
BUN: 63 MG/DL — ABNORMAL HIGH (ref 6–20)
Bilirubin, total: 0.4 MG/DL (ref 0.2–1.0)
CO2: 20 mmol/L — ABNORMAL LOW (ref 21–32)
Calcium: 7.3 MG/DL — ABNORMAL LOW (ref 8.5–10.1)
Chloride: 104 mmol/L (ref 97–108)
Creatinine: 12 MG/DL — ABNORMAL HIGH (ref 0.70–1.30)
GFR est AA: 6 mL/min/{1.73_m2} — ABNORMAL LOW (ref >60)
GFR est non-AA: 5 mL/min/{1.73_m2} — ABNORMAL LOW (ref >60)
Globulin: 4.1 g/dL — ABNORMAL HIGH (ref 2.0–4.0)
Glucose: 110 mg/dL — ABNORMAL HIGH (ref 65–100)
Potassium: 5.5 mmol/L — ABNORMAL HIGH (ref 3.5–5.1)
Protein, total: 7.2 g/dL (ref 6.4–8.2)
Sodium: 137 mmol/L (ref 136–145)

## 2016-06-17 LAB — CBC WITH AUTOMATED DIFF
ABS. BASOPHILS: 0.1 10*3/uL (ref 0.0–0.1)
ABS. EOSINOPHILS: 0.4 10*3/uL (ref 0.0–0.4)
ABS. IMM. GRANS.: 0 10*3/uL (ref 0.00–0.04)
ABS. LYMPHOCYTES: 0.6 10*3/uL — ABNORMAL LOW (ref 0.8–3.5)
ABS. MONOCYTES: 0.4 10*3/uL (ref 0.0–1.0)
ABS. NEUTROPHILS: 4.8 10*3/uL (ref 1.8–8.0)
ABSOLUTE NRBC: 0 10*3/uL (ref 0.00–0.01)
BASOPHILS: 1 % (ref 0–1)
EOSINOPHILS: 7 % (ref 0–7)
HCT: 29.1 % — ABNORMAL LOW (ref 36.6–50.3)
HGB: 9 g/dL — ABNORMAL LOW (ref 12.1–17.0)
IMMATURE GRANULOCYTES: 0 % (ref 0.0–0.5)
LYMPHOCYTES: 10 % — ABNORMAL LOW (ref 12–49)
MCH: 29.8 PG (ref 26.0–34.0)
MCHC: 30.9 g/dL (ref 30.0–36.5)
MCV: 96.4 FL (ref 80.0–99.0)
MONOCYTES: 6 % (ref 5–13)
MPV: 9.9 FL (ref 8.9–12.9)
NEUTROPHILS: 76 % — ABNORMAL HIGH (ref 32–75)
NRBC: 0 PER 100 WBC
PLATELET: 146 10*3/uL — ABNORMAL LOW (ref 150–400)
RBC: 3.02 M/uL — ABNORMAL LOW (ref 4.10–5.70)
RDW: 17.6 % — ABNORMAL HIGH (ref 11.5–14.5)
WBC: 6.3 10*3/uL (ref 4.1–11.1)

## 2016-06-17 LAB — C. DIFFICILE (DNA): C. difficile (DNA): POSITIVE — AB

## 2016-06-17 MED ORDER — HYDROMORPHONE 2 MG/ML INJECTION SOLUTION
2 mg/mL | INTRAMUSCULAR | Status: DC | PRN
Start: 2016-06-17 — End: 2016-06-21
  Administered 2016-06-17 – 2016-06-21 (×28): via INTRAVENOUS

## 2016-06-17 MED ORDER — METRONIDAZOLE 250 MG TAB
250 mg | ORAL | Status: AC
Start: 2016-06-17 — End: 2016-06-16
  Administered 2016-06-17: 02:00:00 via ORAL

## 2016-06-17 MED ORDER — LABETALOL 5 MG/ML IV SOLN
5 mg/mL | INTRAVENOUS | Status: AC
Start: 2016-06-17 — End: 2016-06-16
  Administered 2016-06-17: 03:00:00 via INTRAVENOUS

## 2016-06-17 MED ORDER — ALBUTEROL SULFATE HFA 90 MCG/ACTUATION AEROSOL INHALER
90 mcg/actuation | RESPIRATORY_TRACT | Status: DC | PRN
Start: 2016-06-17 — End: 2016-06-21

## 2016-06-17 MED ORDER — ONDANSETRON (PF) 4 MG/2 ML INJECTION
4 mg/2 mL | INTRAMUSCULAR | Status: DC | PRN
Start: 2016-06-17 — End: 2016-06-21
  Administered 2016-06-17 – 2016-06-20 (×7): via INTRAVENOUS

## 2016-06-17 MED ORDER — HEPARIN (PORCINE) 5,000 UNIT/ML IJ SOLN
5000 unit/mL | Freq: Two times a day (BID) | INTRAMUSCULAR | Status: DC
Start: 2016-06-17 — End: 2016-06-21
  Administered 2016-06-20: 21:00:00 via SUBCUTANEOUS

## 2016-06-17 MED ORDER — SODIUM CHLORIDE 0.9% BOLUS IV
0.9 % | INTRAVENOUS | Status: DC
Start: 2016-06-17 — End: 2016-06-16

## 2016-06-17 MED ORDER — FENTANYL CITRATE (PF) 50 MCG/ML IJ SOLN
50 mcg/mL | INTRAMUSCULAR | Status: AC
Start: 2016-06-17 — End: 2016-06-16
  Administered 2016-06-17: 02:00:00 via INTRAVENOUS

## 2016-06-17 MED ORDER — SODIUM POLYSTYRENE SULFONATE 15 GRAM/60 ML ORAL SUSPENSION
15 gram/60 mL | ORAL | Status: AC
Start: 2016-06-17 — End: 2016-06-16
  Administered 2016-06-17: 04:00:00 via ORAL

## 2016-06-17 MED ORDER — CARVEDILOL 12.5 MG TAB
12.5 mg | Freq: Two times a day (BID) | ORAL | Status: DC
Start: 2016-06-17 — End: 2016-06-20
  Administered 2016-06-19 – 2016-06-20 (×2): via ORAL

## 2016-06-17 MED ORDER — PANTOPRAZOLE 40 MG TAB, DELAYED RELEASE
40 mg | Freq: Every day | ORAL | Status: DC
Start: 2016-06-17 — End: 2016-06-18

## 2016-06-17 MED ORDER — NALOXONE 0.4 MG/ML INJECTION
0.4 mg/mL | INTRAMUSCULAR | Status: DC | PRN
Start: 2016-06-17 — End: 2016-06-21

## 2016-06-17 MED ORDER — DIPHENHYDRAMINE 25 MG CAP
25 mg | ORAL | Status: DC | PRN
Start: 2016-06-17 — End: 2016-06-21

## 2016-06-17 MED ORDER — AMLODIPINE 5 MG TAB
5 mg | Freq: Every day | ORAL | Status: DC
Start: 2016-06-17 — End: 2016-06-16

## 2016-06-17 MED ORDER — METRONIDAZOLE 250 MG TAB
250 mg | ORAL | Status: DC
Start: 2016-06-17 — End: 2016-06-16
  Administered 2016-06-17: 03:00:00 via ORAL

## 2016-06-17 MED ORDER — FAMOTIDINE 20 MG TAB
20 mg | Freq: Two times a day (BID) | ORAL | Status: DC
Start: 2016-06-17 — End: 2016-06-17

## 2016-06-17 MED ORDER — HYDRALAZINE 50 MG TAB
50 mg | Freq: Three times a day (TID) | ORAL | Status: DC
Start: 2016-06-17 — End: 2016-06-21
  Administered 2016-06-17 – 2016-06-21 (×10): via ORAL

## 2016-06-17 MED ORDER — SODIUM CHLORIDE 0.9 % IJ SYRG
INTRAMUSCULAR | Status: DC | PRN
Start: 2016-06-17 — End: 2016-06-21
  Administered 2016-06-19 – 2016-06-20 (×5): via INTRAVENOUS

## 2016-06-17 MED ORDER — FENTANYL CITRATE (PF) 50 MCG/ML IJ SOLN
50 mcg/mL | Freq: Once | INTRAMUSCULAR | Status: AC
Start: 2016-06-17 — End: 2016-06-16
  Administered 2016-06-17: 02:00:00 via INTRAVENOUS

## 2016-06-17 MED ORDER — OXYCODONE-ACETAMINOPHEN 5 MG-325 MG TAB
5-325 mg | ORAL | Status: DC | PRN
Start: 2016-06-17 — End: 2016-06-21

## 2016-06-17 MED ORDER — SEVELAMER CARBONATE 800 MG TAB
800 mg | Freq: Three times a day (TID) | ORAL | Status: DC
Start: 2016-06-17 — End: 2016-06-21
  Administered 2016-06-18 – 2016-06-21 (×11): via ORAL

## 2016-06-17 MED ORDER — NITROGLYCERIN 2 % TRANSDERMAL OINTMENT
2 % | Freq: Two times a day (BID) | TRANSDERMAL | Status: DC
Start: 2016-06-17 — End: 2016-06-21
  Administered 2016-06-17 – 2016-06-18 (×2): via TOPICAL

## 2016-06-17 MED ORDER — ACETAMINOPHEN 325 MG TABLET
325 mg | ORAL | Status: DC | PRN
Start: 2016-06-17 — End: 2016-06-21

## 2016-06-17 MED ORDER — ONDANSETRON (PF) 4 MG/2 ML INJECTION
4 mg/2 mL | INTRAMUSCULAR | Status: AC
Start: 2016-06-17 — End: 2016-06-16
  Administered 2016-06-17: 02:00:00 via INTRAVENOUS

## 2016-06-17 MED ORDER — METRONIDAZOLE 250 MG TAB
250 mg | Freq: Three times a day (TID) | ORAL | Status: DC
Start: 2016-06-17 — End: 2016-06-17
  Administered 2016-06-17: 11:00:00 via ORAL

## 2016-06-17 MED ORDER — SODIUM CHLORIDE 0.9 % IJ SYRG
Freq: Three times a day (TID) | INTRAMUSCULAR | Status: DC
Start: 2016-06-17 — End: 2016-06-21
  Administered 2016-06-17 – 2016-06-21 (×15): via INTRAVENOUS

## 2016-06-17 MED ORDER — VANCOMYCIN ORAL SOLUTION 50 MG/ML CPD (RX COMPOUNDED)
50 mg/mL | Freq: Four times a day (QID) | ORAL | Status: DC
Start: 2016-06-17 — End: 2016-06-21
  Administered 2016-06-17 – 2016-06-21 (×17): via ORAL

## 2016-06-17 MED ORDER — FAMOTIDINE 20 MG TAB
20 mg | Freq: Every day | ORAL | Status: DC
Start: 2016-06-17 — End: 2016-06-20
  Administered 2016-06-17 – 2016-06-20 (×4): via ORAL

## 2016-06-17 MED FILL — SEVELAMER CARBONATE 800 MG TAB: 800 mg | ORAL | Qty: 1

## 2016-06-17 MED FILL — VANCOMYCIN ORAL SOLUTION 50 MG/ML CPD (RX COMPOUNDED): 50 mg/mL | ORAL | Qty: 5

## 2016-06-17 MED FILL — CARVEDILOL 12.5 MG TAB: 12.5 mg | ORAL | Qty: 1

## 2016-06-17 MED FILL — BD POSIFLUSH NORMAL SALINE 0.9 % INJECTION SYRINGE: INTRAMUSCULAR | Qty: 10

## 2016-06-17 MED FILL — HYDROMORPHONE 2 MG/ML INJECTION SOLUTION: 2 mg/mL | INTRAMUSCULAR | Qty: 1

## 2016-06-17 MED FILL — VENTOLIN HFA 90 MCG/ACTUATION AEROSOL INHALER: 90 mcg/actuation | RESPIRATORY_TRACT | Qty: 8

## 2016-06-17 MED FILL — ONDANSETRON (PF) 4 MG/2 ML INJECTION: 4 mg/2 mL | INTRAMUSCULAR | Qty: 2

## 2016-06-17 MED FILL — HYDRALAZINE 50 MG TAB: 50 mg | ORAL | Qty: 2

## 2016-06-17 MED FILL — NITRO-BID 2 % TRANSDERMAL OINTMENT: 2 % | TRANSDERMAL | Qty: 1

## 2016-06-17 MED FILL — SPS (WITH SORBITOL) 15 GRAM-20 GRAM/60 ML ORAL SUSPENSION: 15-20 gram/60 mL | ORAL | Qty: 120

## 2016-06-17 MED FILL — FENTANYL CITRATE (PF) 50 MCG/ML IJ SOLN: 50 mcg/mL | INTRAMUSCULAR | Qty: 2

## 2016-06-17 MED FILL — LABETALOL 5 MG/ML IV SOLN: 5 mg/mL | INTRAVENOUS | Qty: 20

## 2016-06-17 MED FILL — PROTONIX 40 MG TABLET,DELAYED RELEASE: 40 mg | ORAL | Qty: 1

## 2016-06-17 MED FILL — METRONIDAZOLE 250 MG TAB: 250 mg | ORAL | Qty: 2

## 2016-06-17 MED FILL — FAMOTIDINE 20 MG TAB: 20 mg | ORAL | Qty: 1

## 2016-06-17 NOTE — Other (Deleted)
Dr. Alvera NovelGEBREMESKEL, Otelia LimesHEODROS H :  Please clarify if this patient is (was) being treated/managed for:     => Acute kidney failure with tubular necrosis  => Other explanation of clinical findings  => Unable to determine (no explanation for clinical findings)    The medical record reflects the following clinical findings, treatment, and risk factors.    Risk Factors:  46 yo male admitted w/ ESRD  Clinical Indicators:  Bun/Creat: 63/12.00.Marland Kitchen.Marland Kitchen.K+ 5.5.Marland Kitchen.Noted in the chart: [facility] and left AMA before dialysis could occur.   Treatment: IVF NS Bolus 1,0200ml; Kayexalate 30g po/ Nephrology Consult    Please clarify and document your clinical opinion in the progress notes and discharge summary including the definitive and/or presumptive diagnosis, (suspected or probable), related to the above clinical findings. Please include clinical findings supporting your diagnosis.  Thank You, Charolette Childiffany Lewis

## 2016-06-17 NOTE — Consults (Addendum)
[facility] Consult Note        NAME: David [name]       DOB:  [birth year]       MRN:  [id number]     Date/Time: 06/17/2016    Risk of deterioration: low     ??                      Assessment:                                                                            Plan:  ESRD  HTN  CP-had stress 2017 at [facility]  N/V/Diarrhea-seems to be chronic--and hard to demonstrate in the hospital  Anemia  Hx of Cdiff--last 2 tests here (-)  Hx of hep b and ascites  HX of subclavian stenosis Pt discharged from [name] due to non compliance.   Added prn iv hydralazine/clonidine and scheduled clonidine  PT states he's had diarrhea for days--last (2) tests for cdiff here 1/27 and 2/3 were negative. This is what he does to get admitted to the hospital for pain meds and dialysis.   S/P paracentesis in 11/17- for 3.6 l/fluid    IF I remember correctly he was supposed to have had a stool transplant for the cdiff at [place] last fall, but not done b/c prep was poor...it was discovered he had no pseudomembranes on colonoscopy so no future stool transplant was to be considered. (see CC notes from gi)  ??  Last admission at [name] he left AMA before getting dialysis. There is a reason he comes here and not [place] where he lives.    We are not accepting him to any of our units. Pt asked about the [place] unit-also there is a new unit on [address]-he can check these two out.    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     Asked to see for dialysis needs.    Subjective:     Chief Complaint:  I need a place for dialysis    Review of Systems: claims he is having mulitple loose stools daily-I will be curious if this is demonstrated here in the hospital. Does not have a dialysis unit.      Objective:     VITALS:   Last 24hrs VS reviewed since prior progress note. Most recent are:  Visit Vitals   ??? BP (!) 154/98 (BP 1 Location: Right arm, BP Patient Position: At rest)   ??? Pulse 80    ??? Temp 97.6 ??F (36.4 ??C)   ??? Resp 17   ??? Ht '6\' 1"'  (1.854 m)   ??? Wt 100.9 kg (222 lb 7.1 oz)   ??? SpO2 99%   ??? BMI 29.35 kg/m2     SpO2 Readings from Last 6 Encounters:   06/17/16 99%   05/22/16 98%   05/21/16 97%   05/17/16 97%   03/09/16 96%   02/12/16 100%        No intake or output data in the 24 hours ending 06/17/16 1320     PHYSICAL EXAM:    General   well developed, well nourished, appears  stated age, in no acute distress  Respiratory   Clear anteriorly  Cardiology  RRR  Extremities  No edema              Lab Data Reviewed: (see below)    Medications Reviewed: (see below)    PMH/SH reviewed - no change compared to H&P  ____________________________________________________________________________________________    Attending Physician: Lucretia Roers, MD     ____________________________________________________  MEDICATIONS:  Current Facility-Administered Medications   Medication Dose Route Frequency   ??? vancomycin 50 mg/mL oral solution (compounded) 125 mg  125 mg Oral Q6H   ??? nitroglycerin (NITROBID) 2 % ointment 1 Inch  1 Inch Topical BID   ??? albuterol (PROVENTIL HFA, VENTOLIN HFA, PROAIR HFA) inhaler 2 Puff  2 Puff Inhalation Q4H PRN   ??? carvedilol (COREG) tablet 12.5 mg  12.5 mg Oral BID WITH MEALS   ??? hydrALAZINE (APRESOLINE) tablet 100 mg  100 mg Oral TID   ??? pantoprazole (PROTONIX) tablet 40 mg  40 mg Oral DAILY   ??? sevelamer carbonate (RENVELA) tab 800 mg  800 mg Oral TID WITH MEALS   ??? sodium chloride (NS) flush 5-10 mL  5-10 mL IntraVENous Q8H   ??? sodium chloride (NS) flush 5-10 mL  5-10 mL IntraVENous PRN   ??? acetaminophen (TYLENOL) tablet 650 mg  650 mg Oral Q4H PRN   ??? oxyCODONE-acetaminophen (PERCOCET) 5-325 mg per tablet 1 Tab  1 Tab Oral Q4H PRN   ??? HYDROmorphone (DILAUDID) injection 1 mg  1 mg IntraVENous Q4H PRN   ??? naloxone (NARCAN) injection 0.4 mg  0.4 mg IntraVENous PRN   ??? diphenhydrAMINE (BENADRYL) capsule 25 mg  25 mg Oral Q4H PRN    ??? ondansetron (ZOFRAN) injection 4 mg  4 mg IntraVENous Q4H PRN        LABS:  Recent Labs      06/16/16   2010   WBC  6.3   HGB  9.0*   HCT  29.1*   PLT  146*     Recent Labs      06/16/16   2010   NA  137   K  5.5*   CL  104   CO2  20*   BUN  63*   CREA  12.00*   GLU  110*   CA  7.3*     Recent Labs      06/16/16   2010   SGOT  22   ALT  19   AP  330*   TBILI  0.4   TP  7.2   ALB  3.1*   GLOB  4.1*     No results for input(s): INR, PTP, APTT in the last 72 hours.    No lab exists for component: INREXT   No results for input(s): FE, TIBC, PSAT, FERR in the last 72 hours.   No results for input(s): PH, PCO2, PO2 in the last 72 hours.  No results for input(s): CPK, CKNDX, TROIQ in the last 72 hours.    No lab exists for component: CPKMB  Lab Results   Component Value Date/Time    Glucose (POC) 96 03/04/2016 02:04 PM    Glucose (POC) 105 (H) 02/29/2016 11:05 AM

## 2016-06-17 NOTE — Other (Deleted)
Dr. GEBREMESKEL, THEODROS H :  The diagnosis of HTN has been documented for this patient.  HTN is an unspecified term.  Based on your medical judgement, could your documentation be further specified as:     =>Hypertensive urgency  =>Hypertensive crisis  =>Hypertensive emergency  =>Other Explanation of clinical findings  =>Unable to Determine (no explanation of clinical findings)     The medical record reflects the following clinical findings, treatment, and risk factors:     Risk Factors:45 yo male admitted w/ESRD   Clinical Indicators: BP: 180/119; 195/114; 193/124; 187;111; 205/117.. P: 89-92  Treatment: NitroBid 2% ointment 1 inch topical; Labetolol 20mg IV; Hydralazine 100mg po;      Please clarify and document your clinical opinion in the progress notes and discharge summary including the definitive and/or presumptive diagnosis, (suspected or probable), related to the above clinical findings. Please include clinical findings supporting your diagnosis.     REFERENCE:  -----------------------------------------------  Hypertensive Urgency: SBP > 180 or DBP > 120 in the absence of progressive target organ dysfunction     Hypertensive Crisis: BP elevates rapidly and severely enough to potentially cause organ damage (e.g. severe HA, SOB, nosebleed, severe anxiety, nausea/vomiting, etc.)    Hypertensive Emergency: SBP >180 or DBP > 120 with associated organ dysfunction (e.g. CVA, LOC, memory loss, MI, AKI, aortic dissection, angina, pulmonary edema, encephalopathy, retinopathy, HELLP, etc.)  Thank You, Tiffany Lewis

## 2016-06-17 NOTE — Progress Notes (Signed)
Medicated for nausea.  Pt requesting pain medication, per MD orders Dilaudid due at 12:30.  Dr. Quillian Quinceufour at bedside for pre procedure consult and consent.  Dr. Refusing procedure at this time.  Pt encouraged to proceed in hopes of helping his abdominal pain and nausea.   Pt continues to refuse, suggesting that "maybe tomorrow" he will have it done.  MD consult complete.  Transport requested.  Update called to primary RN.

## 2016-06-17 NOTE — Other (Signed)
Bedside shift change report given to [facility],RN   (Cabin crewoncoming nurse) by Edwina BarthHannah,RN (offgoing nurse). Report included the following information SBAR, Kardex, Procedure Summary, Intake/Output, MAR, Accordion, Recent Results and Med Rec Status.

## 2016-06-17 NOTE — Other (Addendum)
0130--  Pt came out into hallway expressing the need to find his wallet.  He thinks he left it in his truck or it fell out between the truck and walking into the ED.  I have told him it is against policy for me to allow him to leave the building while admitted.He cannot leave with his IV in or his telemetry box on.  Pt threatening to leave AMA multiple times.  Nursing house supervisor and security aware of situation.  I have paged the ED to try and get a hold of someone to help look for wallet.  Dr. Kathreen DevoidLapetina notified.  Orders to let pt walk down to truck only if accompanied by security one time.    0345--Pt refusing labs to be drawn despite education as to why they are needed.  Pt became very frustrated with me, flailing his arms about.  I was told he wanted to speak to a nursing supervisor right away and for me to go get him his pain medication.  Med medication administered, nursing supervisor notified.  Will let nurse manager know about this. Charge nurse aware.     0457--Nursing house supervisor came to unit and touched base with me.  She did not go see this patient.  Will notify nurse manager in the morning.

## 2016-06-17 NOTE — Other (Signed)
[facility]  [phone]    Vitals   Pre   Post   Assessment   Pre   Post     Temp  Temp: 97.5 ??F (36.4 ??C) (06/17/16 1626)  97.2 LOC  A&O x 4 No change   HR   Pulse (Heart Rate): 82 (06/17/16 1855) 91 Lungs   CTA upper and diminished mid and lower and no c/o SOB No change   B/P   BP: (!) 191/113 (06/17/16 1855) 174/104 Cardiac   No c/o CP, S1S2 No change   Resp   Resp Rate: 18 (06/17/16 1855) 18 Skin   Warm and dry No change   Pain level  Pain Intensity 1: 10 (06/17/16 1652) 10/10 primary RN aware Edema  generalized     Achieved 0.6 kg    Orders:    Duration:   Start:   1900 End:   2050 Total:   1:50   Dialyzer:   Dialyzer/Set Up Inspection: Revaclear (06/17/16 1855)   K Bath:   Dialysate K (mEq/L): 2 (06/17/16 1855)   Ca Bath:   Dialysate CA (mEq/L): 2.5 (06/17/16 1855)   Na/Bicarb:   Dialysate NA (mEq/L): 138 (06/17/16 1855)   Target Fluid Removal:   Goal/Amount of Fluid to Remove (mL): 3000 mL (06/17/16 1855)   Access     Type & Location:   [facility]   Labs     Obtained/Reviewed   Critical Results Called   Date when labs were drawn-  Hgb-    HGB   Date Value Ref Range Status   06/16/2016 9.0 (L) 12.1 - 17.0 g/dL Final     K-    Potassium   Date Value Ref Range Status   06/16/2016 5.5 (H) 3.5 - 5.1 mmol/L Final     Ca-   Calcium   Date Value Ref Range Status   06/16/2016 7.3 (L) 8.5 - 10.1 MG/DL Final     Bun-   BUN   Date Value Ref Range Status   06/16/2016 63 (H) 6 - 20 MG/DL Final     Creat-   Creatinine   Date Value Ref Range Status   06/16/2016 12.00 (H) 0.70 - 1.30 MG/DL Final        Medications/ Blood Products Given     Name   Dose   Route and Time           None adm          Blood Volume Processed (BVP):   41.1 Net Fluid   Removed:  600 mL   Comments   Time Out Done: [id number]  Primary Nurse Rpt Pre: Reggy EyeAdair Neighbors RN (per inquiry, no labs prior to HD)  Primary Nurse Rpt Post: Leighton RoachHannah Overschmi RN  Pt Education: HD compliance  Care Plan: by primary RN   Tx Summary: tx initiated to [facility] w/o issue; pt only ran 1 hr 50 min of his 4 hour tx; he asked to come off early r/t cramping.  Admiting Diagnosis: ESRD on Dialysis, no HD in 1 week with Mild Pulmonary Edema, Volume Overload and Hyperkalemia  Pt's previous clinic- no clinic at this  Consent signed - Informed Consent Verified: Yes (06/17/16 1855)  [facility] Consent - 06/17/2016  Hepatitis Status- Ag neg and Ab 15 as of 10/15/2015  Machine #- Machine Number: [id number] (06/17/16 1855)  Telemetry status- n/a  Pre-dialysis wt.- Pre-Dialysis Weight: 101 kg (222 lb 10.6 oz) (06/17/16 1855)

## 2016-06-17 NOTE — Progress Notes (Signed)
Pt removed heart monitor and is refusing cardiac monitoring.  He states "he isn't here for a heart issue".  Will report findings to Dr. Alvera NovelGebremeskel.

## 2016-06-17 NOTE — Progress Notes (Signed)
Famotidine dose adjusted to 20 mg daily for CrCl <50 ml/min per protocol.    Thank you,  Eric K MacPherson, PHARMD

## 2016-06-17 NOTE — Progress Notes (Signed)
Bedside shift change report given to Dahlia ClientHannah, RN (oncoming nurse) by Coy Saunasebecca Adair Neighbors, RN (offgoing nurse). Report included the following information SBAR, Kardex, MAR and Recent Results.

## 2016-06-17 NOTE — Progress Notes (Signed)
Hospitalist Progress Note  NAME: David [name]   DOB:  [birth year]   MRN:  [id number]       Assessment / Plan:  ESRD on Dialysis, no HD in 1 week with Mild Pulmonary Edema, Volume Overload and Hyperkalemia  Nephrology Inputs and recommendations Appreciated.  Planned HD ?     Diarrhea with history of C. diff  C. Diff came back positive.  Patient still having the diahhrea   Switched him to po vancomycin   ID consulted for recurrent c.diff   Stopped Protonix and added Pepcid for now.    Abdominal Pain:c.diff might have contributed to the abdominal pain too.  Ascites: chronic, patient states that he even has history of Liver biopsy  Patient refused paracentesis today and he said he wanted to do it tomorrow.  Pain medication for now     Hypertension:  hold home Norvasc since her BLE swelling  continue home Coreg and Hydralazine  continue NTG paste for now  BP will improve after HD        Body mass index is 29.35 kg/(m^2).    Code status: Full  Prophylaxis: Hep SQ  Recommended Disposition: Home w/Family     Subjective:     Chief Complaint / Reason for Physician Visit  I am still having the abdominal pain .  Discussed with RN events overnight.     Review of Systems:  Symptom Y/N Comments  Symptom Y/N Comments   Fever/Chills n   Chest Pain n    Poor Appetite n   Edema y    Cough n   Abdominal Pain y    Sputum n   Joint Pain n    SOB/DOE n   Pruritis/Rash n    Nausea/vomit n   Tolerating PT/OT     Diarrhea y   Tolerating Diet     Constipation    Other       Could NOT obtain due to:      Objective:     VITALS:   Last 24hrs VS reviewed since prior progress note. Most recent are:  Patient Vitals for the past 24 hrs:   Temp Pulse Resp BP SpO2   06/17/16 1130 - 80 17 (!) 154/98 99 %   06/17/16 0956 97.6 ??F (36.4 ??C) 80 18 (!) 155/95 98 %   06/17/16 0342 97.9 ??F (36.6 ??C) 81 20 (!) 147/94 96 %   06/16/16 2346 98 ??F (36.7 ??C) 81 20 (!) 157/99 97 %   06/16/16 2245 - - - (!) 166/107 97 %   06/16/16 2230 - - - (!) 162/110 98 %    06/16/16 2210 - - - (!) 168/113 96 %   06/16/16 2200 - - - (!) 159/101 -   06/16/16 2130 - - - (!) 180/119 98 %   06/16/16 2115 - - - (!) 195/114 99 %   06/16/16 2104 - - - (!) 193/124 -   06/16/16 2045 - - - (!) 187/111 -   06/16/16 1953 98.2 ??F (36.8 ??C) 93 20 (!) 205/117 100 %     No intake or output data in the 24 hours ending 06/17/16 1525     PHYSICAL EXAM:  General: WD, WN. Alert, cooperative, no acute distress????  EENT:  EOMI. Anicteric sclerae. MMM  Resp:  CTA bilaterally, no wheezing or rales.  No accessory muscle use  CV:  Regular  rhythm,?? 1 to 2 plus  edema  GI:   distended abdomen but non  tender  , Non tender. ??+Bowel sounds              Neurologic:?? Alert and oriented X 3, normal speech,   Psych:???? Good insight.??Not anxious nor agitated  Skin:  No rashes.  No jaundice    Reviewed most current lab test results and cultures  YES  Reviewed most current radiology test results   YES  Review and summation of old records today    NO  Reviewed patient's current orders and MAR    YES  PMH/SH reviewed - no change compared to H&P  ________________________________________________________________________  Care Plan discussed with:    Comments   Patient y    Family      RN y    Buyer, retailCare Manager     Consultant                        Multidiciplinary team rounds were held today with case manager, nursing, pharmacist and Higher education careers adviserclinical coordinator.  Patient's plan of care was discussed; medications were reviewed and discharge planning was addressed.     ________________________________________________________________________  Total NON critical care TIME:  30   Minutes    Total CRITICAL CARE TIME Spent:   Minutes non procedure based      Comments   >50% of visit spent in counseling and coordination of care     ________________________________________________________________________  Derrick Ravelheodros H Porfiria Heinrich, MD     Procedures: see electronic medical records for all procedures/Xrays and  details which were not copied into this note but were reviewed prior to creation of Plan.      LABS:  I reviewed today's most current labs and imaging studies.  Pertinent labs include:  Recent Labs      06/16/16   2010   WBC  6.3   HGB  9.0*   HCT  29.1*   PLT  146*     Recent Labs      06/16/16   2010   NA  137   K  5.5*   CL  104   CO2  20*   GLU  110*   BUN  63*   CREA  12.00*   CA  7.3*   ALB  3.1*   TBILI  0.4   SGOT  22   ALT  19       Signed: Derrick Ravelheodros H Rina Adney, MD

## 2016-06-17 NOTE — Progress Notes (Signed)
Bedside shift change report given to Adair, RN (oncoming nurse) by Kamari, RN (offgoing nurse). Report included the following information SBAR.

## 2016-06-17 NOTE — Progress Notes (Signed)
Pt arrived to xray recovery area w transport via stretcher.

## 2016-06-17 NOTE — Consults (Signed)
[facility] Consult Note        NAME: David [name]       DOB:  [birth year]       MRN:  [id number]     Date/Time: 06/17/2016    Risk of deterioration: low     ??                      Assessment:                                                                            Plan:  ESRD  HTN  CP-had stress 2017 at [facility]  N/V/Diarrhea-seems to be chronic--and hard to demonstrate in the hospital  Anemia  Hx of Cdiff--last 2 tests here (-)  Hx of hep b and ascites  HX of subclavian stenosis Pt discharged from [name] due to non compliance.   Added prn iv hydralazine/clonidine and scheduled clonidine  PT states he's had diarrhea for days--last (2) tests for cdiff here 1/27 and 2/3 were negative. This is what he does to get admitted to the hospital for pain meds and dialysis.   S/P paracentesis in 11/17- for 3.6 l/fluid    IF I remember correctly he was supposed to have had a stool transplant for the cdiff at [place] last fall, but not done b/c prep was poor...it was discovered he had no pseudomembranes on colonoscopy so no future stool transplant was to be considered. (see CC notes from gi)  ??  Last admission at [name] he left AMA before getting dialysis. There is a reason he comes here and not [place] where he lives.    We are not accepting him to any of our units. Pt asked about the [facility] unit-also there is a new unit on [address]-he can check these two out.    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     Asked to see for dialysis needs.    Subjective:     Chief Complaint:  I need a place for dialysis    Review of Systems: claims he is having mulitple loose stools daily-I will be curious if this is demonstrated here in the hospital. Does not have a dialysis unit.      Objective:     VITALS:   Last 24hrs VS reviewed since prior progress note. Most recent are:  Visit Vitals   ??? BP (!) 154/98 (BP 1 Location: Right arm, BP Patient Position: At rest)   ??? Pulse 80    ??? Temp 97.6 ??F (36.4 ??C)   ??? Resp 17   ??? Ht '6\' 1"'  (1.854 m)   ??? Wt 100.9 kg (222 lb 7.1 oz)   ??? SpO2 99%   ??? BMI 29.35 kg/m2     SpO2 Readings from Last 6 Encounters:   06/17/16 99%   05/22/16 98%   05/21/16 97%   05/17/16 97%   03/09/16 96%   02/12/16 100%        No intake or output data in the 24 hours ending 06/17/16 1320     PHYSICAL EXAM:    General   well developed, well nourished, appears  stated age, in no acute distress  Respiratory   Clear anteriorly  Cardiology  RRR  Extremities  No edema              Lab Data Reviewed: (see below)    Medications Reviewed: (see below)    PMH/SH reviewed - no change compared to H&P  ____________________________________________________________________________________________    Attending Physician: Lucretia Roers, MD     ____________________________________________________  MEDICATIONS:  Current Facility-Administered Medications   Medication Dose Route Frequency   ??? vancomycin 50 mg/mL oral solution (compounded) 125 mg  125 mg Oral Q6H   ??? nitroglycerin (NITROBID) 2 % ointment 1 Inch  1 Inch Topical BID   ??? albuterol (PROVENTIL HFA, VENTOLIN HFA, PROAIR HFA) inhaler 2 Puff  2 Puff Inhalation Q4H PRN   ??? carvedilol (COREG) tablet 12.5 mg  12.5 mg Oral BID WITH MEALS   ??? hydrALAZINE (APRESOLINE) tablet 100 mg  100 mg Oral TID   ??? pantoprazole (PROTONIX) tablet 40 mg  40 mg Oral DAILY   ??? sevelamer carbonate (RENVELA) tab 800 mg  800 mg Oral TID WITH MEALS   ??? sodium chloride (NS) flush 5-10 mL  5-10 mL IntraVENous Q8H   ??? sodium chloride (NS) flush 5-10 mL  5-10 mL IntraVENous PRN   ??? acetaminophen (TYLENOL) tablet 650 mg  650 mg Oral Q4H PRN   ??? oxyCODONE-acetaminophen (PERCOCET) 5-325 mg per tablet 1 Tab  1 Tab Oral Q4H PRN   ??? HYDROmorphone (DILAUDID) injection 1 mg  1 mg IntraVENous Q4H PRN   ??? naloxone (NARCAN) injection 0.4 mg  0.4 mg IntraVENous PRN   ??? diphenhydrAMINE (BENADRYL) capsule 25 mg  25 mg Oral Q4H PRN   ??? ondansetron (ZOFRAN) injection 4 mg  4 mg  IntraVENous Q4H PRN        LABS:  Recent Labs      06/16/16   2010   WBC  6.3   HGB  9.0*   HCT  29.1*   PLT  146*     Recent Labs      06/16/16   2010   NA  137   K  5.5*   CL  104   CO2  20*   BUN  63*   CREA  12.00*   GLU  110*   CA  7.3*     Recent Labs      06/16/16   2010   SGOT  22   ALT  19   AP  330*   TBILI  0.4   TP  7.2   ALB  3.1*   GLOB  4.1*     No results for input(s): INR, PTP, APTT in the last 72 hours.    No lab exists for component: INREXT   No results for input(s): FE, TIBC, PSAT, FERR in the last 72 hours.   No results for input(s): PH, PCO2, PO2 in the last 72 hours.  No results for input(s): CPK, CKNDX, TROIQ in the last 72 hours.    No lab exists for component: CPKMB  Lab Results   Component Value Date/Time    Glucose (POC) 96 03/04/2016 02:04 PM    Glucose (POC) 105 (H) 02/29/2016 11:05 AM

## 2016-06-18 ENCOUNTER — Inpatient Hospital Stay: Admit: 2016-06-18 | Payer: MEDICARE

## 2016-06-18 LAB — CELL COUNT, BODY FLUID
FLD LYMPHS: 3 % — AB
FLD MONO/MACROPHAGES: 96 % — AB
FLUID MESOTHELIAL: 1 % — AB
FLUID NUCLEATED CELLS: 910 /mm3
FLUID RBC CT.: >100 /mm3 — ABNORMAL HIGH

## 2016-06-18 LAB — CULTURE, STOOL
Campylobacter antigen: NEGATIVE
Shiga toxin-producing E. coli Ag: NEGATIVE

## 2016-06-18 MED ORDER — HYDRALAZINE 20 MG/ML IJ SOLN
20 mg/mL | INTRAMUSCULAR | Status: DC | PRN
Start: 2016-06-18 — End: 2016-06-21
  Administered 2016-06-20: 01:00:00 via INTRAVENOUS

## 2016-06-18 MED ORDER — L.ACIDOPH & PARACASEI-S.THERMOPHIL-BIFIDOBACTERIUM 8 BILLION CELL CAP
8 billion cell | Freq: Every day | ORAL | Status: DC
Start: 2016-06-18 — End: 2016-06-21
  Administered 2016-06-18 – 2016-06-21 (×5): via ORAL

## 2016-06-18 MED ORDER — LIDOCAINE (PF) 10 MG/ML (1 %) IJ SOLN
10 mg/mL (1 %) | Freq: Once | INTRAMUSCULAR | Status: AC
Start: 2016-06-18 — End: 2016-06-18
  Administered 2016-06-18: 21:00:00 via SUBCUTANEOUS

## 2016-06-18 MED FILL — BD POSIFLUSH NORMAL SALINE 0.9 % INJECTION SYRINGE: INTRAMUSCULAR | Qty: 10

## 2016-06-18 MED FILL — HYDROMORPHONE 2 MG/ML INJECTION SOLUTION: 2 mg/mL | INTRAMUSCULAR | Qty: 1

## 2016-06-18 MED FILL — NITRO-BID 2 % TRANSDERMAL OINTMENT: 2 % | TRANSDERMAL | Qty: 1

## 2016-06-18 MED FILL — VANCOMYCIN ORAL SOLUTION 50 MG/ML CPD (RX COMPOUNDED): 50 mg/mL | ORAL | Qty: 5

## 2016-06-18 MED FILL — RISAQUAD 8 BILLION CELL CAPSULE: 8 billion cell | ORAL | Qty: 1

## 2016-06-18 MED FILL — ONDANSETRON (PF) 4 MG/2 ML INJECTION: 4 mg/2 mL | INTRAMUSCULAR | Qty: 2

## 2016-06-18 MED FILL — CARVEDILOL 12.5 MG TAB: 12.5 mg | ORAL | Qty: 1

## 2016-06-18 MED FILL — HYDRALAZINE 50 MG TAB: 50 mg | ORAL | Qty: 2

## 2016-06-18 MED FILL — SEVELAMER CARBONATE 800 MG TAB: 800 mg | ORAL | Qty: 1

## 2016-06-18 MED FILL — PROTONIX 40 MG TABLET,DELAYED RELEASE: 40 mg | ORAL | Qty: 1

## 2016-06-18 MED FILL — FAMOTIDINE 20 MG TAB: 20 mg | ORAL | Qty: 1

## 2016-06-18 NOTE — Progress Notes (Cosign Needed)
At [phone], I went to assess pt and he requested I come back when he finished eating. At 0855, pt refused assessment again, stating: "I can't do this; please leave".

## 2016-06-18 NOTE — Progress Notes (Signed)
4500ccs of yellow fluid removed during paracentesis.  Patient tolerated without difficulty.

## 2016-06-18 NOTE — Progress Notes (Signed)
25 minutes after I gave this gentleman a dose of Dilaudid, transport came to get him for his paracentesis. He asked me for another dose of Dilaudid, saying the dose that I gave him did not help him at all. I told him that it was too soon for another dose. He asked me to page the physician stating again that the Dilaudid did not work at all and that he needs more.  I suggested that I ask the physician for a different medication if the Dilaudid was not helping him. He stated Dilaudid is the only thing that works. When I reminded him that he just told me that the Dilaudid did not work at all, he told me to just forget it. He does not want any other kind of pain medication. Transport brought him down for the paracentesis.

## 2016-06-18 NOTE — Other (Deleted)
Dr. Alvera NovelGEBREMESKEL, THEODROS H :  The diagnosis of HTN has been documented for this patient.  HTN is an unspecified term.  Based on your medical judgement, could your documentation be further specified as:     =>Hypertensive urgency  =>Hypertensive crisis  =>Hypertensive emergency  =>Other Explanation of clinical findings  =>Unable to Determine (no explanation of clinical findings)     The medical record reflects the following clinical findings, treatment, and risk factors:     Risk Factors:45 yo male admitted w/ESRD   Clinical Indicators: BP: 180/119; 195/114; 193/124; 187;111; 205/117.. P: 89-92  Treatment: NitroBid 2% ointment 1 inch topical; Labetolol 20mg  IV; Hydralazine 100mg  po;      Please clarify and document your clinical opinion in the progress notes and discharge summary including the definitive and/or presumptive diagnosis, (suspected or probable), related to the above clinical findings. Please include clinical findings supporting your diagnosis.     REFERENCE:  -----------------------------------------------  Hypertensive Urgency: SBP > 180 or DBP > 120 in the absence of progressive target organ dysfunction     Hypertensive Crisis: BP elevates rapidly and severely enough to potentially cause organ damage (e.g. severe HA, SOB, nosebleed, severe anxiety, nausea/vomiting, etc.)    Hypertensive Emergency: SBP >180 or DBP > 120 with associated organ dysfunction (e.g. CVA, LOC, memory loss, MI, AKI, aortic dissection, angina, pulmonary edema, encephalopathy, retinopathy, HELLP, etc.)  Thank You, Charolette Childiffany Lewis

## 2016-06-18 NOTE — Progress Notes (Signed)
Pt admitted positive for C.Diff and volume overload.  Pt is a non compliant HD pt who his [name] unit will not accept anymore.  He has gone to several hospitals for meds//HD.Peggye Form.  I've been asked to fax out chart info to some local units.  [facility]  I received a VM from [place] at [phone] asking me to call [facility] admissions at [phone] and speak with [place]anti and to fax pertinent chart info to [phone] which I did.  Await their call back.    Care Management Interventions  PCP Verified by CM: Yes  Mode of Transport at Discharge: Self  Transition of Care Consult (CM Consult): Discharge Planning  MyChart Signup: No  Discharge Durable Medical Equipment: No  Physical Therapy Consult: No  Occupational Therapy Consult: No  Speech Therapy Consult: No  Current Support Network: Family Lives Nearby  Confirm Follow Up Transport: Family  Plan discussed with Pt/Family/Caregiver: No  Discharge Location  Discharge Placement: Home

## 2016-06-18 NOTE — Other (Deleted)
Please clarify if this patient is (was) being treated/managed for:     => Acute kidney failure with tubular necrosis  => Other explanation of clinical findings  => Unable to determine (no explanation for clinical findings)    The medical record reflects the following clinical findings, treatment, and risk factors.    Risk Factors:  46 yo male admitted w/ ESRD  Clinical Indicators:  Bun/Creat: 63/12.00.Marland Kitchen.Marland Kitchen.K+ 5.5.Marland Kitchen.Noted in the chart: [facility] and left AMA before dialysis could occur.   Treatment: IVF NS Bolus 1,01900ml; Kayexalate 30g po/ Nephrology Consult    Please clarify and document your clinical opinion in the progress notes and discharge summary including the definitive and/or presumptive diagnosis, (suspected or probable), related to the above clinical findings. Please include clinical findings supporting your diagnosis.  Thank You, Charolette Childiffany Lewis

## 2016-06-18 NOTE — Consults (Signed)
Infectious Disease Consult    Date of Consultation:  March 2 ,2018    Referring Physician:  Dr Alvera Novel    Subjective:     Patient is a 46 y.o. male who is being seen for recurrent C.dii colitis    IMPRESSION:   1.  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 No documented bowel movements ? colonization vs active disease.   2. ESRD on HD h/o missed dialysis prior to this admission  3. Ascites / H/o liver disease  4. HTN  With elevated BP       PLAN:   1.  Vancomycin pulsed & tapered dosing( see dosing below) , probiotics for now.FMT ( fecal microbiota transfer would be treatment of choice for recurrent C.diff. This has been discussed on previous admission & recommendation for follow up at a center that does FMT ( [facility] )was made  2. Patient instructed to notify RN of bowel movements prior to flushing/ discussed this with RN present in room.     Vancomycin pulse & taper as follows :  Vancomycin 125 mg 4 times daily x 7 days  Vancomycin 125 mg bid x 7 days   Vancomycin 125 daily x 7 days  Vancomycin EODx 7 days  Vancomycin  Every 3 days x 14 days      David [name] is a 46 y.o.  male who presents with abdominal pain and diarrhea. Patient has ESRDon HD, recurrent clostridium difficile colitis   h/o  non-compliance, manipulative behavior in order to stay in hospital. He has been in the hospital multiple times and is well known to me from an  admission in November '17. Pt had sigmoidoscopy on that admission ( 03/04/16) Mucous membranes appeared normal. No pseudomembranes were noted. Formed stool had been noted in colon .G I's comment had been "hard to believe it is a pathogenic C.diff". Pt had been planned for FMT that day , but due to the findings & presence of lots of formed stool & fact that he scope could not be advanced  procedure had been abandoned .  Pt today reports 5-6 watery stools daily  On asking his nurse in charge she is not  aware  No documentation in his electronic chart . Pt states that he has had C. Diff "11 times" .  H/o treatment with po Vancomycin , flagyl , fidaxomycin.  He reports 5-6  BM's daily for the last week.   Patient had missed dialysis x 2 prior to admission.His CXR revealed mild pulmonary edema. Potsassium was measured at 5.5. Pt was admitted thereafter.  Pt seen on floor today . Denies , fever, chills , ongoing watery stools 5-6 /24 hours which have not been documented.On questioning pt about abdominal pain he states it is still present, less.    Patient Active Problem List   Diagnosis Code   ??? Gastroenteritis K52.9   ??? Peritonitis (HCC) K65.9   ??? Recurrent Clostridium difficile diarrhea A04.71   ??? Ascites R18.8   ??? ESRD (end stage renal disease) on dialysis (HCC) N18.6, Z99.2   ??? H/O noncompliance with medical treatment, presenting hazards to health Z91.19   ??? Diarrhea of infectious origin A09   ??? Left arm swelling M79.89   ??? Pain in left upper arm M79.622   ??? C. difficile colitis A04.72   ??? Hyperkalemia E87.5   ??? NSTEMI (non-ST elevated myocardial infarction) (HCC) I21.4   ??? Volume overload E87.70     Past Medical History:  Diagnosis Date   ??? Adverse effect of anesthesia     sleep apnea uses oxygen at night    ??? Ascites    ??? Asthma    ??? C. difficile colitis    ??? CKD (chronic kidney disease) stage V requiring chronic dialysis (HCC)    ??? HTN (hypertension)    ??? SBP (spontaneous bacterial peritonitis) (HCC)       Family History   Problem Relation Age of Onset   ??? Hypertension Mother       Social History   Substance Use Topics   ??? Smoking status: Heavy Tobacco Smoker     Packs/day: 0.50     Years: 10.00   ??? Smokeless tobacco: Never Used   ??? Alcohol use No     Past Surgical History:   Procedure Laterality Date   ??? COLONOSCOPY N/A 03/04/2016    COLONOSCOPY performed by Sandy SalaamAlex R Seamon, MD at [facility]   ??? HX HERNIA REPAIR     ??? SIGMOIDOSCOPY,DIAGNOSTIC  03/04/2016           Prior to Admission medications     Medication Sig Start Date End Date Taking? Authorizing Provider   sevelamer (RENAGEL) 400 mg tablet Take 800 mg by mouth three (3) times daily (with meals).    Phys Other, MD   carvedilol (COREG) 12.5 mg tablet Take  by mouth two (2) times daily (with meals).    Phys Other, MD   hydrALAZINE (APRESOLINE) 100 mg tablet Take 100 mg by mouth three (3) times daily.    Phys Other, MD   amLODIPine (NORVASC) 10 mg tablet Take 10 mg by mouth daily.    Phys Other, MD   pantoprazole (PROTONIX) 40 mg tablet Take 40 mg by mouth daily.    Phys Other, MD   furosemide (LASIX) 80 mg tablet Take 80 mg by mouth daily.    Phys Other, MD   albuterol (PROVENTIL HFA, VENTOLIN HFA, PROAIR HFA) 90 mcg/actuation inhaler Take 2 Puffs by inhalation every four (4) hours as needed for Wheezing.    Phys Other, MD     Allergies   Allergen Reactions   ??? Lisinopril Shortness of Breath   ??? Morphine Shortness of Breath   ??? Toradol [Ketorolac] Shortness of Breath        Review of Systems:  A comprehensive review of systems was negative except for that written in the History of Present Illness.    Objective:   Blood pressure (!) 186/101, pulse 95, temperature 98.1 ??F (36.7 ??C), resp. rate 20, height 6\' 1"  (1.854 m), weight 101.9 kg (224 lb 10.4 oz), SpO2 99 %.  Temp (24hrs), Avg:98 ??F (36.7 ??C), Min:97.8 ??F (36.6 ??C), Max:98.2 ??F (36.8 ??C)    Current Facility-Administered Medications   Medication Dose Route Frequency   ??? carvedilol (COREG) tablet 12.5 mg  12.5 mg Oral BID WITH MEALS   ??? amLODIPine (NORVASC) tablet 10 mg  10 mg Oral DAILY   ??? [START ON 06/21/2016] pantoprazole (PROTONIX) tablet 40 mg  40 mg Oral ACB   ??? hydrALAZINE (APRESOLINE) 20 mg/mL injection 10 mg  10 mg IntraVENous Q2H PRN   ??? lactobac ac& pc-s.therm-b.anim (FLORA Q/RISAQUAD)  1 Cap Oral DAILY   ??? vancomycin 50 mg/mL oral solution (compounded) 125 mg  125 mg Oral Q6H   ??? heparin (porcine) injection 5,000 Units  5,000 Units SubCUTAneous Q12H    ??? nitroglycerin (NITROBID) 2 % ointment 1 Inch  1 Inch Topical BID   ???  albuterol (PROVENTIL HFA, VENTOLIN HFA, PROAIR HFA) inhaler 2 Puff  2 Puff Inhalation Q4H PRN   ??? hydrALAZINE (APRESOLINE) tablet 100 mg  100 mg Oral TID   ??? sevelamer carbonate (RENVELA) tab 800 mg  800 mg Oral TID WITH MEALS   ??? sodium chloride (NS) flush 5-10 mL  5-10 mL IntraVENous Q8H   ??? sodium chloride (NS) flush 5-10 mL  5-10 mL IntraVENous PRN   ??? acetaminophen (TYLENOL) tablet 650 mg  650 mg Oral Q4H PRN   ??? oxyCODONE-acetaminophen (PERCOCET) 5-325 mg per tablet 1 Tab  1 Tab Oral Q4H PRN   ??? HYDROmorphone (DILAUDID) injection 1 mg  1 mg IntraVENous Q4H PRN   ??? naloxone (NARCAN) injection 0.4 mg  0.4 mg IntraVENous PRN   ??? diphenhydrAMINE (BENADRYL) capsule 25 mg  25 mg Oral Q4H PRN   ??? ondansetron (ZOFRAN) injection 4 mg  4 mg IntraVENous Q4H PRN        Exam:    General:  Alert, cooperative,    Eyes:  Sclera anicteric. Pupils equally round and reactive to light.   Mouth/Throat: Mucous membranes normal, oral pharynx clear   Neck: Supple   Lungs:   Reduced air entry on auscultation bases   CV:  Regular rate and rhythm,no murmur, click, rub or gallop   Abdomen:   Soft, ?tender. bowel sounds +.  mildly distended   Extremities: No edema   Skin: Skin color, texture, turgor normal. no acute rash or lesions   Lymph nodes: Cervical and supraclavicular normal       Lines/Devices:  Intact, no erythema, drainage or tenderness   Psych: Alert and oriented, normal mood affect          Lab Results   Component Value Date/Time    Culture result: NO GROWTH 2 DAYS 06/18/2016 02:40 PM    Culture result:  06/16/2016 08:27 PM     NO ROUTINE ENTERIC PATHOGENS ISOLATED INCLUDING SALMONELLA, SHIGELLA, YERSINIA, VIBRIO OR SHIGA TOXIN PRODUCING E. COLI    Culture result:  05/22/2016 12:00 PM     NO ROUTINE ENTERIC PATHOGENS ISOLATED INCLUDING SALMONELLA, SHIGELLA, YERSINIA, VIBRIO OR E. COLI 0157:H7    Culture result: NO COLIFORMS ISOLATED 05/22/2016 12:00 PM     Culture result: HEAVY  GRAM POSITIVE FLORA ISOLATED   05/22/2016 12:00 PM          XR Results (most recent):    Results from Hospital Encounter encounter on 06/16/16   XR CHEST PORT   Narrative EXAM:  XR CHEST PORT    INDICATION:  End-stage renal disease on dialysis. Missed dialysis. Abdominal  pain and diarrhea. Previous Clostridium difficile.    COMPARISON: Portable chest on 05/21/2016    TECHNIQUE: Upright portable chest AP digital view    FINDINGS: [Dialysis catheter is unchanged. Right subclavian vascular stent is  unchanged. Cardiomegaly and aortic atherosclerosis are unchanged. The pulmonary  vasculature is within normal limits.     Mild interstitial pulmonary edema is increased. Probable small bilateral pleural  effusions. No pneumothorax. No evidence of pneumonia. The visualized bones and  upper abdomen are age-appropriate.         Impression IMPRESSION:    Increased mild interstitial pulmonary edema. No pneumothorax.                ICD-10-CM ICD-9-CM    1. Diarrhea of presumed infectious origin A09 009.3    2. Pulmonary edema, acute (HCC) J81.0 518.4    3. Noncompliance Z91.19 V15.81  I have discussed the diagnosis with the patient and the intended plan as seen in the above orders.     I have discussed medication side effects and warnings with the patient as well.    Reviewed test results at length with patient    Signed By: Richardean Chimera, MD  FACP     June 18, 2016

## 2016-06-18 NOTE — Other (Signed)
Procedure reviewed with pt.  Opportunity to verbalize questions and concerns.  Consent obtained.

## 2016-06-18 NOTE — Progress Notes (Signed)
Report given to [name].

## 2016-06-18 NOTE — Progress Notes (Signed)
Hospitalist Progress Note  NAME: Laqueta Lindenroy D Tamblyn   DOB:  12/22/1970   MRN:  [id number]       Assessment / Plan:  ESRD on Dialysis, no HD in 1 week with Mild Pulmonary Edema, Volume Overload and Hyperkalemia  Nephrology Inputs and recommendations Appreciated.  Had HD yesterday and he will have a 4 hour session today .likely after paracentesis     Diarrhea with history of C. diff  C. Diff came back positive.  Patient still having the diahhrea   Switched him to po vancomycin   ID consulted for recurrent c.diff   Stopped Protonix and added Pepcid for now.    Abdominal Pain:c.diff might have contributed to the abdominal pain too.  Ascites: chronic, patient states that he even has history of Liver biopsy  He will have  paracentesis today   Pain medication for now     Hypertension:  hold home Norvasc since her BLE swelling  continue home Coreg and Hydralazine  continue NTG paste for now  BP will improve after HD        Body mass index is 29.9 kg/(m^2).    Code status: Full  Prophylaxis: Hep SQ  Recommended Disposition: Home w/Family     Subjective:     Chief Complaint / Reason for Physician Visit  I am still having the abdominal pain .  Discussed with RN events overnight.     Review of Systems:  Symptom Y/N Comments  Symptom Y/N Comments   Fever/Chills n   Chest Pain n    Poor Appetite n   Edema y    Cough n   Abdominal Pain y    Sputum n   Joint Pain n    SOB/DOE n   Pruritis/Rash n    Nausea/vomit n   Tolerating PT/OT     Diarrhea y   Tolerating Diet     Constipation    Other       Could NOT obtain due to:      Objective:     VITALS:   Last 24hrs VS reviewed since prior progress note. Most recent are:  Patient Vitals for the past 24 hrs:   Temp Pulse Resp BP SpO2   06/18/16 0817 98.6 ??F (37 ??C) 94 16 (!) 162/96 100 %   06/18/16 0100 98.2 ??F (36.8 ??C) 84 16 (!) 179/100 98 %   06/17/16 2030 - 87 18 (!) 168/111 -   06/17/16 2000 - 89 18 (!) 189/107 -   06/17/16 1930 - 90 18 (!) 170/112 -    06/17/16 1855 - 82 18 (!) 191/113 -   06/17/16 1850 - 80 18 (!) 183/108 -   06/17/16 1626 97.5 ??F (36.4 ??C) 80 21 (!) 163/102 100 %       Intake/Output Summary (Last 24 hours) at 06/18/16 1431  Last data filed at 06/17/16 2050   Gross per 24 hour   Intake                0 ml   Output              600 ml   Net             -600 ml        PHYSICAL EXAM:  General: WD, WN. Alert, cooperative, no acute distress????  EENT:  EOMI. Anicteric sclerae. MMM  Resp:  CTA bilaterally, no wheezing or rales.  No accessory muscle use  CV:  Regular  rhythm,?? 1 to 2 plus  edema  GI:   distended abdomen but non tender  , Non tender. ??+Bowel sounds              Neurologic:?? Alert and oriented X 3, normal speech,   Psych:???? Good insight.??Not anxious nor agitated  Skin:  No rashes.  No jaundice    Reviewed most current lab test results and cultures  YES  Reviewed most current radiology test results   YES  Review and summation of old records today    NO  Reviewed patient's current orders and MAR    YES  PMH/SH reviewed - no change compared to H&P  ________________________________________________________________________  Care Plan discussed with:    Comments   Patient y    Family      RN y    Buyer, retail                        Multidiciplinary team rounds were held today with case manager, nursing, pharmacist and Higher education careers adviser.  Patient's plan of care was discussed; medications were reviewed and discharge planning was addressed.     ________________________________________________________________________  Total NON critical care TIME:  30   Minutes    Total CRITICAL CARE TIME Spent:   Minutes non procedure based      Comments   >50% of visit spent in counseling and coordination of care     ________________________________________________________________________  Derrick Ravel, MD     Procedures: see electronic medical records for all procedures/Xrays and  details which were not copied into this note but were reviewed prior to creation of Plan.      LABS:  I reviewed today's most current labs and imaging studies.  Pertinent labs include:  Recent Labs      06/16/16   2010   WBC  6.3   HGB  9.0*   HCT  29.1*   PLT  146*     Recent Labs      06/16/16   2010   NA  137   K  5.5*   CL  104   CO2  20*   GLU  110*   BUN  63*   CREA  12.00*   CA  7.3*   ALB  3.1*   TBILI  0.4   SGOT  22   ALT  19       Signed: Derrick Ravel, MD

## 2016-06-18 NOTE — Progress Notes (Signed)
Pt was off floor, went into the cafeteria. I explained to him that with his C-Diff infection he is not able to do that. I explained that he could pass his infection off to other people.  He stated that he understood.

## 2016-06-18 NOTE — Consults (Addendum)
Pt seen & examined.  Patient known to me from previous admission   H/o non compliance, manipulative behavior to stay in hospital   Chart reviewed.Labs reviewed.     Assessment  Recurrent C.diff colitis  No bowel movements documented by staff .  Pt reports about 5-6 daily since admission & even before admission.  Has been on vancomycin , flagyl , fidaxamycin in the past.      Plan    1.Change to Vancomycin pulsed & tapered dosing/ probiotics for now   Fecal microbiota microbiota transplant would be treatment of choice for multiple recurrences.Pt needs to be referred to Center that does that procedure . We discussed this on last admission as well.  2. Pt instructed to notify RN of bowel movement , discussed this with his nurse present in room      Vancomycin pulse & taper as follows :  Vancomycin 125 mg 4 times daily x 7 days  Vancomycin 125 mg bid x 7 days   Vancomycin 125 daily x 7 days  Vancomycin EODx 7 days  Vancomycin  Every 3 days x 14 days    Ski Polich S Jaiyah Beining, MD FACP

## 2016-06-18 NOTE — Progress Notes (Signed)
TRANSFER - OUT REPORT:    Verbal report given to [place], RN(name) on David [name]  being transferred to 3262(unit) for routine progression of care       Report consisted of patient???s Situation, Background, Assessment and   Recommendations(SBAR).     Information from the following report(s) Procedure Summary was reviewed with the receiving nurse.    Lines:   Peripheral IV 06/16/16 Right Hand (Active)   Site Assessment Clean, dry, & intact 06/18/2016  9:46 AM   Phlebitis Assessment 0 06/18/2016  9:46 AM   Infiltration Assessment 0 06/18/2016  9:46 AM   Dressing Status Clean, dry, & intact 06/18/2016  9:46 AM   Dressing Type Tape;Transparent 06/18/2016  9:46 AM   Hub Color/Line Status Blue;Capped 06/18/2016  9:46 AM   Action Taken Blood drawn 06/17/2016  7:30 AM   Alcohol Cap Used Yes 06/17/2016  7:40 PM        Opportunity for questions and clarification was provided.

## 2016-06-18 NOTE — Progress Notes (Signed)
[facility] Progress Note        NAME: David [name]       DOB:  07-11-1970       MRN:  [id number]     Date/Time: 06/18/2016    Risk of deterioration: low     ??                      Assessment:                                                                            Plan:  ESRD  HTN  CP-had stress 2017 at [facility]  N/V/Diarrhea-seems to be chronic--and hard to demonstrate in the hospital  Anemia  Hx of Cdiff--last 2 tests here (-) now (+)  Hx of hep b and ascites  HX of subclavian stenosis Pt discharged from [name] due to non compliance.   Cdiff (+) again  S/P paracentesis in 11/17- for 3.6 l/fluid, for paracentesis today    IF I remember correctly he was supposed to have had a stool transplant for the cdiff at [facility] last fall, but not done b/c prep was poor...it was discovered he had no pseudomembranes on colonoscopy so no future stool transplant was to be considered. (see [facility] notes from gi)  ??  Last admission at [name] he left AMA before getting dialysis. There is a reason he comes here and not [place] where he lives.    We are not accepting him to any of our units. Pt asked about the [facility] unit-also there is a new unit on [address]-he can check these two out.    Pt dialyzed at [facility] on [address] dialyzed for 2 hours yesterday due to "cramping"-holding hd today due to paracentesis, HD tomorrow-         Subjective:     Chief Complaint:  I cramped on dialysis last night    Review of Systems: states he is nauseated-but in the cafeteria-WITH CDIFF!, states he has diarrhea, but none documented---      Objective:     VITALS:   Last 24hrs VS reviewed since prior progress note. Most recent are:  Visit Vitals   ??? BP (!) 162/96 (BP 1 Location: Right arm, BP Patient Position: At rest)   ??? Pulse 94   ??? Temp 98.6 ??F (37 ??C)   ??? Resp 16   ??? Ht '6\' 1"'  (1.854 m)   ??? Wt 102.8 kg (226 lb 10.1 oz)   ??? SpO2 100%   ??? BMI 29.9 kg/m2     SpO2 Readings from Last 6 Encounters:   06/18/16 100%    05/22/16 98%   05/21/16 97%   05/17/16 97%   03/09/16 96%   02/12/16 100%    O2 Flow Rate (L/min): 2 l/min       Intake/Output Summary (Last 24 hours) at 06/18/16 1401  Last data filed at 06/17/16 2050   Gross per 24 hour   Intake                0 ml   Output              600 ml  Net             -600 ml        PHYSICAL EXAM:    General   well developed, well nourished, appears stated age, in no acute distress  Respiratory   Clear anteriorly  Cardiology  RRR  Extremities  No edema              Lab Data Reviewed: (see below)    Medications Reviewed: (see below)    PMH/SH reviewed - no change compared to H&P  ____________________________________________________________________________________________    Attending Physician: Lucretia Roers, MD     ____________________________________________________  MEDICATIONS:  Current Facility-Administered Medications   Medication Dose Route Frequency   ??? hydrALAZINE (APRESOLINE) 20 mg/mL injection 10 mg  10 mg IntraVENous Q2H PRN   ??? lactobac ac& pc-s.therm-b.anim (FLORA Q/RISAQUAD)  1 Cap Oral DAILY   ??? vancomycin 50 mg/mL oral solution (compounded) 125 mg  125 mg Oral Q6H   ??? heparin (porcine) injection 5,000 Units  5,000 Units SubCUTAneous Q12H   ??? famotidine (PEPCID) tablet 20 mg  20 mg Oral DAILY   ??? nitroglycerin (NITROBID) 2 % ointment 1 Inch  1 Inch Topical BID   ??? albuterol (PROVENTIL HFA, VENTOLIN HFA, PROAIR HFA) inhaler 2 Puff  2 Puff Inhalation Q4H PRN   ??? carvedilol (COREG) tablet 12.5 mg  12.5 mg Oral BID WITH MEALS   ??? hydrALAZINE (APRESOLINE) tablet 100 mg  100 mg Oral TID   ??? sevelamer carbonate (RENVELA) tab 800 mg  800 mg Oral TID WITH MEALS   ??? sodium chloride (NS) flush 5-10 mL  5-10 mL IntraVENous Q8H   ??? sodium chloride (NS) flush 5-10 mL  5-10 mL IntraVENous PRN   ??? acetaminophen (TYLENOL) tablet 650 mg  650 mg Oral Q4H PRN   ??? oxyCODONE-acetaminophen (PERCOCET) 5-325 mg per tablet 1 Tab  1 Tab Oral Q4H PRN    ??? HYDROmorphone (DILAUDID) injection 1 mg  1 mg IntraVENous Q4H PRN   ??? naloxone (NARCAN) injection 0.4 mg  0.4 mg IntraVENous PRN   ??? diphenhydrAMINE (BENADRYL) capsule 25 mg  25 mg Oral Q4H PRN   ??? ondansetron (ZOFRAN) injection 4 mg  4 mg IntraVENous Q4H PRN        LABS:  Recent Labs      06/16/16   2010   WBC  6.3   HGB  9.0*   HCT  29.1*   PLT  146*     Recent Labs      06/16/16   2010   NA  137   K  5.5*   CL  104   CO2  20*   BUN  63*   CREA  12.00*   GLU  110*   CA  7.3*     Recent Labs      06/16/16   2010   SGOT  22   ALT  19   AP  330*   TBILI  0.4   TP  7.2   ALB  3.1*   GLOB  4.1*     No results for input(s): INR, PTP, APTT in the last 72 hours.    No lab exists for component: INREXT, INREXT   No results for input(s): FE, TIBC, PSAT, FERR in the last 72 hours.   No results for input(s): PH, PCO2, PO2 in the last 72 hours.  No results for input(s): CPK, CKNDX, TROIQ in the last 72 hours.  No lab exists for component: CPKMB  Lab Results   Component Value Date/Time    Glucose (POC) 96 03/04/2016 02:04 PM    Glucose (POC) 105 (H) 02/29/2016 11:05 AM

## 2016-06-18 NOTE — Consults (Signed)
Pt seen & examined.  Patient known to me from previous admission   H/o non compliance, manipulative behavior to stay in hospital   Chart reviewed.Labs reviewed.     Assessment  Recurrent C.diff colitis  No bowel movements documented by staff .  Pt reports about 5-6 daily since admission & even before admission.  Has been on vancomycin , flagyl , fidaxamycin in the past.      Plan    1.Change to Vancomycin pulsed & tapered dosing/ probiotics for now   Fecal microbiota microbiota transplant would be treatment of choice for multiple recurrences.Pt needs to be referred to Center that does that procedure . We discussed this on last admission as well.  2. Pt instructed to notify RN of bowel movement , discussed this with his nurse present in room      Vancomycin pulse & taper as follows :  Vancomycin 125 mg 4 times daily x 7 days  Vancomycin 125 mg bid x 7 days   Vancomycin 125 daily x 7 days  Vancomycin EODx 7 days  Vancomycin  Every 3 days x 14 days    Richardean Chimeraiane S Equilla Que, MD Jerrel IvoryFACP

## 2016-06-18 NOTE — Consults (Signed)
Infectious Disease Consult    Date of Consultation:  March 2 ,2018    Referring Physician:  Dr Alvera NovelGebremeskel    Subjective:     Patient is a 46 y.o. male who is being seen for recurrent C.dii colitis    IMPRESSION:   1.  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 No documented bowel movements ? colonization vs active disease.   2. ESRD on HD h/o missed dialysis prior to this admission  3. Ascites / H/o liver disease  4. HTN  With elevated BP       PLAN:   1.  Vancomycin pulsed & tapered dosing( see dosing below) , probiotics for now.FMT ( fecal microbiota transfer would be treatment of choice for recurrent C.diff. This has been discussed on previous admission & recommendation for follow up at a center that does FMT ( [facility] )was made  2. Patient instructed to notify RN of bowel movements prior to flushing/ discussed this with RN present in room.     Vancomycin pulse & taper as follows :  Vancomycin 125 mg 4 times daily x 7 days  Vancomycin 125 mg bid x 7 days   Vancomycin 125 daily x 7 days  Vancomycin EODx 7 days  Vancomycin  Every 3 days x 14 days      David [name] is a 46 y.o.  male who presents with abdominal pain and diarrhea. Patient has ESRDon HD, recurrent clostridium difficile colitis   h/o  non-compliance, manipulative behavior in order to stay in hospital. He has been in the hospital multiple times and is well known to me from an  admission in November '17. Pt had sigmoidoscopy on that admission ( 03/04/16) Mucous membranes appeared normal. No pseudomembranes were noted. Formed stool had been noted in colon .G I's comment had been "hard to believe it is a pathogenic C.diff". Pt had been planned for FMT that day , but due to the findings & presence of lots of formed stool & fact that he scope could not be advanced  procedure had been abandoned .  Pt today reports 5-6 watery stools daily  On asking his nurse in charge she is not aware  No documentation in his electronic chart . Pt states that  he has had C. Diff "11 times" .  H/o treatment with po Vancomycin , flagyl , fidaxomycin.  He reports 5-6  BM's daily for the last week.   Patient had missed dialysis x 2 prior to admission.His CXR revealed mild pulmonary edema. Potsassium was measured at 5.5. Pt was admitted thereafter.  Pt seen on floor today . Denies , fever, chills , ongoing watery stools 5-6 /24 hours which have not been documented.On questioning pt about abdominal pain he states it is still present, less.    Patient Active Problem List   Diagnosis Code   ??? Gastroenteritis K52.9   ??? Peritonitis (HCC) K65.9   ??? Recurrent Clostridium difficile diarrhea A04.71   ??? Ascites R18.8   ??? ESRD (end stage renal disease) on dialysis (HCC) N18.6, Z99.2   ??? H/O noncompliance with medical treatment, presenting hazards to health Z91.19   ??? Diarrhea of infectious origin A09   ??? Left arm swelling M79.89   ??? Pain in left upper arm M79.622   ??? C. difficile colitis A04.72   ??? Hyperkalemia E87.5   ??? NSTEMI (non-ST elevated myocardial infarction) (HCC) I21.4   ??? Volume overload E87.70     Past Medical History:  Diagnosis Date   ??? Adverse effect of anesthesia     sleep apnea uses oxygen at night    ??? Ascites    ??? Asthma    ??? C. difficile colitis    ??? CKD (chronic kidney disease) stage V requiring chronic dialysis (HCC)    ??? HTN (hypertension)    ??? SBP (spontaneous bacterial peritonitis) (HCC)       Family History   Problem Relation Age of Onset   ??? Hypertension Mother       Social History   Substance Use Topics   ??? Smoking status: Heavy Tobacco Smoker     Packs/day: 0.50     Years: 10.00   ??? Smokeless tobacco: Never Used   ??? Alcohol use No     Past Surgical History:   Procedure Laterality Date   ??? COLONOSCOPY N/A 03/04/2016    COLONOSCOPY performed by Sandy Salaam, MD at [facility]   ??? HX HERNIA REPAIR     ??? SIGMOIDOSCOPY,DIAGNOSTIC  03/04/2016           Prior to Admission medications    Medication Sig Start Date End Date Taking? Authorizing Provider   sevelamer  (RENAGEL) 400 mg tablet Take 800 mg by mouth three (3) times daily (with meals).    Phys Other, MD   carvedilol (COREG) 12.5 mg tablet Take  by mouth two (2) times daily (with meals).    Phys Other, MD   hydrALAZINE (APRESOLINE) 100 mg tablet Take 100 mg by mouth three (3) times daily.    Phys Other, MD   amLODIPine (NORVASC) 10 mg tablet Take 10 mg by mouth daily.    Phys Other, MD   pantoprazole (PROTONIX) 40 mg tablet Take 40 mg by mouth daily.    Phys Other, MD   furosemide (LASIX) 80 mg tablet Take 80 mg by mouth daily.    Phys Other, MD   albuterol (PROVENTIL HFA, VENTOLIN HFA, PROAIR HFA) 90 mcg/actuation inhaler Take 2 Puffs by inhalation every four (4) hours as needed for Wheezing.    Phys Other, MD     Allergies   Allergen Reactions   ??? Lisinopril Shortness of Breath   ??? Morphine Shortness of Breath   ??? Toradol [Ketorolac] Shortness of Breath        Review of Systems:  A comprehensive review of systems was negative except for that written in the History of Present Illness.    Objective:   Blood pressure (!) 186/101, pulse 95, temperature 98.1 ??F (36.7 ??C), resp. rate 20, height 6\' 1"  (1.854 m), weight 101.9 kg (224 lb 10.4 oz), SpO2 99 %.  Temp (24hrs), Avg:98 ??F (36.7 ??C), Min:97.8 ??F (36.6 ??C), Max:98.2 ??F (36.8 ??C)    Current Facility-Administered Medications   Medication Dose Route Frequency   ??? carvedilol (COREG) tablet 12.5 mg  12.5 mg Oral BID WITH MEALS   ??? amLODIPine (NORVASC) tablet 10 mg  10 mg Oral DAILY   ??? [START ON 06/21/2016] pantoprazole (PROTONIX) tablet 40 mg  40 mg Oral ACB   ??? hydrALAZINE (APRESOLINE) 20 mg/mL injection 10 mg  10 mg IntraVENous Q2H PRN   ??? lactobac ac& pc-s.therm-b.anim (FLORA Q/RISAQUAD)  1 Cap Oral DAILY   ??? vancomycin 50 mg/mL oral solution (compounded) 125 mg  125 mg Oral Q6H   ??? heparin (porcine) injection 5,000 Units  5,000 Units SubCUTAneous Q12H   ??? nitroglycerin (NITROBID) 2 % ointment 1 Inch  1 Inch Topical BID   ???  albuterol (PROVENTIL HFA, VENTOLIN HFA, PROAIR  HFA) inhaler 2 Puff  2 Puff Inhalation Q4H PRN   ??? hydrALAZINE (APRESOLINE) tablet 100 mg  100 mg Oral TID   ??? sevelamer carbonate (RENVELA) tab 800 mg  800 mg Oral TID WITH MEALS   ??? sodium chloride (NS) flush 5-10 mL  5-10 mL IntraVENous Q8H   ??? sodium chloride (NS) flush 5-10 mL  5-10 mL IntraVENous PRN   ??? acetaminophen (TYLENOL) tablet 650 mg  650 mg Oral Q4H PRN   ??? oxyCODONE-acetaminophen (PERCOCET) 5-325 mg per tablet 1 Tab  1 Tab Oral Q4H PRN   ??? HYDROmorphone (DILAUDID) injection 1 mg  1 mg IntraVENous Q4H PRN   ??? naloxone (NARCAN) injection 0.4 mg  0.4 mg IntraVENous PRN   ??? diphenhydrAMINE (BENADRYL) capsule 25 mg  25 mg Oral Q4H PRN   ??? ondansetron (ZOFRAN) injection 4 mg  4 mg IntraVENous Q4H PRN        Exam:    General:  Alert, cooperative,    Eyes:  Sclera anicteric. Pupils equally round and reactive to light.   Mouth/Throat: Mucous membranes normal, oral pharynx clear   Neck: Supple   Lungs:   Reduced air entry on auscultation bases   CV:  Regular rate and rhythm,no murmur, click, rub or gallop   Abdomen:   Soft, ?tender. bowel sounds +.  mildly distended   Extremities: No edema   Skin: Skin color, texture, turgor normal. no acute rash or lesions   Lymph nodes: Cervical and supraclavicular normal       Lines/Devices:  Intact, no erythema, drainage or tenderness   Psych: Alert and oriented, normal mood affect          Lab Results   Component Value Date/Time    Culture result: NO GROWTH 2 DAYS 06/18/2016 02:40 PM    Culture result:  06/16/2016 08:27 PM     NO ROUTINE ENTERIC PATHOGENS ISOLATED INCLUDING SALMONELLA, SHIGELLA, YERSINIA, VIBRIO OR SHIGA TOXIN PRODUCING E. COLI    Culture result:  05/22/2016 12:00 PM     NO ROUTINE ENTERIC PATHOGENS ISOLATED INCLUDING SALMONELLA, SHIGELLA, YERSINIA, VIBRIO OR E. COLI 0157:H7    Culture result: NO COLIFORMS ISOLATED 05/22/2016 12:00 PM    Culture result: HEAVY  GRAM POSITIVE FLORA ISOLATED   05/22/2016 12:00 PM          XR Results (most recent):    Results  from Hospital Encounter encounter on 06/16/16   XR CHEST PORT   Narrative EXAM:  XR CHEST PORT    INDICATION:  End-stage renal disease on dialysis. Missed dialysis. Abdominal  pain and diarrhea. Previous Clostridium difficile.    COMPARISON: Portable chest on 05/21/2016    TECHNIQUE: Upright portable chest AP digital view    FINDINGS: [Dialysis catheter is unchanged. Right subclavian vascular stent is  unchanged. Cardiomegaly and aortic atherosclerosis are unchanged. The pulmonary  vasculature is within normal limits.     Mild interstitial pulmonary edema is increased. Probable small bilateral pleural  effusions. No pneumothorax. No evidence of pneumonia. The visualized bones and  upper abdomen are age-appropriate.         Impression IMPRESSION:    Increased mild interstitial pulmonary edema. No pneumothorax.                ICD-10-CM ICD-9-CM    1. Diarrhea of presumed infectious origin A09 009.3    2. Pulmonary edema, acute (HCC) J81.0 518.4    3. Noncompliance Z91.19 V15.81  I have discussed the diagnosis with the patient and the intended plan as seen in the above orders.     I have discussed medication side effects and warnings with the patient as well.    Reviewed test results at length with patient    Signed By: Richardean Chimera, MD  FACP     June 18, 2016

## 2016-06-19 LAB — METABOLIC PANEL, COMPREHENSIVE
A-G Ratio: 0.9 — ABNORMAL LOW (ref 1.1–2.2)
ALT (SGPT): 15 U/L (ref 12–78)
AST (SGOT): 23 U/L (ref 15–37)
Albumin: 2.7 g/dL — ABNORMAL LOW (ref 3.5–5.0)
Alk. phosphatase: 275 U/L — ABNORMAL HIGH (ref 45–117)
Anion gap: 10 mmol/L (ref 5–15)
BUN/Creatinine ratio: 6 — ABNORMAL LOW (ref 12–20)
BUN: 66 MG/DL — ABNORMAL HIGH (ref 6–20)
Bilirubin, total: 0.4 MG/DL (ref 0.2–1.0)
CO2: 23 mmol/L (ref 21–32)
Calcium: 6.1 MG/DL — CL (ref 8.5–10.1)
Chloride: 106 mmol/L (ref 97–108)
Creatinine: 11.8 MG/DL — ABNORMAL HIGH (ref 0.70–1.30)
GFR est AA: 6 mL/min/{1.73_m2} — ABNORMAL LOW (ref >60)
GFR est non-AA: 5 mL/min/{1.73_m2} — ABNORMAL LOW (ref >60)
Globulin: 3.1 g/dL (ref 2.0–4.0)
Glucose: 111 mg/dL — ABNORMAL HIGH (ref 65–100)
Potassium: 5.9 mmol/L — ABNORMAL HIGH (ref 3.5–5.1)
Protein, total: 5.8 g/dL — ABNORMAL LOW (ref 6.4–8.2)
Sodium: 139 mmol/L (ref 136–145)

## 2016-06-19 LAB — CBC WITH AUTOMATED DIFF
ABS. BASOPHILS: 0 10*3/uL (ref 0.0–0.1)
ABS. EOSINOPHILS: 0.4 10*3/uL (ref 0.0–0.4)
ABS. IMM. GRANS.: 0 10*3/uL (ref 0.00–0.04)
ABS. LYMPHOCYTES: 0.3 10*3/uL — ABNORMAL LOW (ref 0.8–3.5)
ABS. MONOCYTES: 0.4 10*3/uL (ref 0.0–1.0)
ABS. NEUTROPHILS: 3.5 10*3/uL (ref 1.8–8.0)
ABSOLUTE NRBC: 0 10*3/uL (ref 0.00–0.01)
BASOPHILS: 0 % (ref 0–1)
EOSINOPHILS: 8 % — ABNORMAL HIGH (ref 0–7)
HCT: 24.7 % — ABNORMAL LOW (ref 36.6–50.3)
HGB: 7.6 g/dL — ABNORMAL LOW (ref 12.1–17.0)
IMMATURE GRANULOCYTES: 0 % (ref 0.0–0.5)
LYMPHOCYTES: 7 % — ABNORMAL LOW (ref 12–49)
MCH: 29.9 PG (ref 26.0–34.0)
MCHC: 30.8 g/dL (ref 30.0–36.5)
MCV: 97.2 FL (ref 80.0–99.0)
MONOCYTES: 8 % (ref 5–13)
MPV: 10.4 FL (ref 8.9–12.9)
NEUTROPHILS: 77 % — ABNORMAL HIGH (ref 32–75)
NRBC: 0 PER 100 WBC
PLATELET: 110 10*3/uL — ABNORMAL LOW (ref 150–400)
RBC: 2.54 M/uL — ABNORMAL LOW (ref 4.10–5.70)
RDW: 17.8 % — ABNORMAL HIGH (ref 11.5–14.5)
WBC: 4.6 10*3/uL (ref 4.1–11.1)

## 2016-06-19 LAB — MAGNESIUM: Magnesium: 2.2 mg/dL (ref 1.6–2.4)

## 2016-06-19 LAB — PHOSPHORUS: Phosphorus: 6 MG/DL — ABNORMAL HIGH (ref 2.6–4.7)

## 2016-06-19 MED FILL — HYDROMORPHONE 2 MG/ML INJECTION SOLUTION: 2 mg/mL | INTRAMUSCULAR | Qty: 1

## 2016-06-19 MED FILL — ONDANSETRON (PF) 4 MG/2 ML INJECTION: 4 mg/2 mL | INTRAMUSCULAR | Qty: 2

## 2016-06-19 MED FILL — VANCOMYCIN ORAL SOLUTION 50 MG/ML CPD (RX COMPOUNDED): 50 mg/mL | ORAL | Qty: 5

## 2016-06-19 MED FILL — HYDRALAZINE 20 MG/ML IJ SOLN: 20 mg/mL | INTRAMUSCULAR | Qty: 1

## 2016-06-19 MED FILL — SEVELAMER CARBONATE 800 MG TAB: 800 mg | ORAL | Qty: 1

## 2016-06-19 MED FILL — CARVEDILOL 12.5 MG TAB: 12.5 mg | ORAL | Qty: 1

## 2016-06-19 MED FILL — BD POSIFLUSH NORMAL SALINE 0.9 % INJECTION SYRINGE: INTRAMUSCULAR | Qty: 20

## 2016-06-19 MED FILL — FAMOTIDINE 20 MG TAB: 20 mg | ORAL | Qty: 1

## 2016-06-19 MED FILL — RISAQUAD 8 BILLION CELL CAPSULE: 8 billion cell | ORAL | Qty: 1

## 2016-06-19 MED FILL — HYDRALAZINE 50 MG TAB: 50 mg | ORAL | Qty: 2

## 2016-06-19 MED FILL — BD POSIFLUSH NORMAL SALINE 0.9 % INJECTION SYRINGE: INTRAMUSCULAR | Qty: 10

## 2016-06-19 MED FILL — DIPHENHYDRAMINE 25 MG CAP: 25 mg | ORAL | Qty: 1

## 2016-06-19 NOTE — Other (Signed)
Arrived at pt's room to do dialysis, pt requesting to be 'done later in the day' so he can eat breakfast. Primary nurse notified about pt's request. Will re-attempt to run patient later today

## 2016-06-19 NOTE — Procedures (Signed)
[facility] [place]  [phone]    Vitals   Pre   Post   Assessment   Pre   Post     Temp  Temp: 98 ??F (36.7 ??C) (06/19/16 0739)  97.8 LOC  A&OX4 A&OX4   HR   Pulse (Heart Rate): 86 (06/19/16 1450) 95 Lungs   diminished  diminished   B/P   BP: (!) 167/104 (06/19/16 1450) 197/101 Cardiac   HRR  HRR   Resp   Resp Rate: 16 (06/19/16 1450) 16 Skin   intact  intact   Pain level  Pain Intensity 1: 10 (06/19/16 1320) 0 Edema      2+LEE 2+LEE   Orders:    Duration:   Start:   Procedure Start Time: 7371 End:   0626 Total:   2.5 hours   Dialyzer:   Dialyzer/Set Up Inspection: Revaclear (06/19/16 1450)   K Bath:   Dialysate K (mEq/L): 2 (06/19/16 1450)   Ca Bath:   Dialysate CA (mEq/L): 2.5 (06/19/16 1450)   Na/Bicarb:   Dialysate NA (mEq/L): 140 (06/19/16 1450)   Target Fluid Removal:   Goal/Amount of Fluid to Remove (mL): 3000 mL (06/19/16 1450)   Access     Type & Location:   [facility] permacath Ports of [place]/permcath disinfected with Alcohol per policy.  Each lumen aspirated for blood return and flushed with Normal Saline per policy.  Dialysis initiated.Central line catheter flushed with normal saline per policy.  Ports disinfected with Alcohol per policy and lines disconnected and capped using aseptic technique.  See LDA docflowsheet for dressing information.     Labs     Obtained/Reviewed   Critical Results Called   Date when labs were drawn-  Hgb-    HGB   Date Value Ref Range Status   06/16/2016 9.0 (L) 12.1 - 17.0 g/dL Final     K-    Potassium   Date Value Ref Range Status   06/16/2016 5.5 (H) 3.5 - 5.1 mmol/L Final     Ca-   Calcium   Date Value Ref Range Status   06/16/2016 7.3 (L) 8.5 - 10.1 MG/DL Final     Bun-   BUN   Date Value Ref Range Status   06/16/2016 63 (H) 6 - 20 MG/DL Final     Creat-   Creatinine   Date Value Ref Range Status   06/16/2016 12.00 (H) 0.70 - 1.30 MG/DL Final        Medications/ Blood Products Given     Name   Dose   Route and Time     none                 Blood Volume Processed (BVP):    49.8 liters Net Fluid   Removed:  1035 ml   Comments: Patients' bath changed to 2 K 3.0 Ca 45 minutes into tx, per Dr Drucilla Chalet. After patient was on machine about 1.5 hours he said that he would only run 3.5 hours because more tx time gave him a headache. At 2 hours into tx he said he was cramping and wanted to end tx. I talked him into running longer, gave some saline and turned off the uf. In another 1/2 hour he insisted on ending tx and I was unable to convince him to run longer. Tx ended and Dr Drucilla Chalet notified. No other problems Report given to Med Tele nurse, Cherlyn Roberts RN, using SBAR.  Time Out Done:  yes, 1450  Primary Nurse Rpt Pre: Juleen Starr RN  Primary Nurse Rpt [place] RN  Pt Education: Compliance  Care Plan: HD as ordered  Tx Summary: 45 minutes on 2 K 2.5 Ca and then 1 hour and 40 minutes on 2 K 3 Ca removed 1035 ml.  Admiting Diagnosis:  Pt's previous clinic-[facility]( he was fired from them)  Consent signed - Informed Consent Verified: Yes (06/19/16 1450)  [facility] Consent - yes  Hepatitis Status- Ag neg and Ab 15 as of 10/15/2015  Machine #- Machine Number: [id number] (06/19/16 1450)  Telemetry status-no  Pre-dialysis wt.- Pre-Dialysis Weight: 101 kg (222 lb 10.6 oz) (06/17/16 1855)

## 2016-06-19 NOTE — Progress Notes (Signed)
Hospitalist Progress Note  NAME: David [name]   DOB:  23-Jan-1971   MRN:  [id number]       Assessment / Plan:  ESRD on Dialysis, no HD in 1 week with Mild Pulmonary Edema, Volume Overload and Hyperkalemia  Nephrology Inputs and recommendations Appreciated.  HD today   Had paracentesis yesterday and 4500 cc of yellow clear fluid were drained .will flow up fluid analysis     Diarrhea with history of C. diff  C. Diff came back positive.  Patient still having the diahhrea   On po  vancomycin pulse therapy   ID Inputs and recommendations Appreciated.  Stopped Protonix and added Pepcid for now.    Abdominal Pain:c.diff might have contributed to the abdominal pain too.  Ascites: chronic, patient states that he even has history of Liver biopsy  S/p  paracentesis   Pain medication for now     Hypertension:  hold home Norvasc since her BLE swelling  continue home Coreg and Hydralazine  continue NTG paste for now  BP will improve after HD        Body mass index is 29.49 kg/(m^2).    Code status: Full  Prophylaxis: Hep SQ  Recommended Disposition: Home w/Family     Subjective:     Chief Complaint / Reason for Physician Visit  I am still having the abdominal pain .  Discussed with RN events overnight.     Review of Systems:  Symptom Y/N Comments  Symptom Y/N Comments   Fever/Chills n   Chest Pain n    Poor Appetite n   Edema y    Cough n   Abdominal Pain y    Sputum n   Joint Pain n    SOB/DOE n   Pruritis/Rash n    Nausea/vomit n   Tolerating PT/OT     Diarrhea y   Tolerating Diet     Constipation    Other       Could NOT obtain due to:      Objective:     VITALS:   Last 24hrs VS reviewed since prior progress note. Most recent are:  Patient Vitals for the past 24 hrs:   Temp Pulse Resp BP SpO2   06/19/16 0739 98 ??F (36.7 ??C) 87 16 (!) 197/110 100 %   06/18/16 2314 98.8 ??F (37.1 ??C) 94 19 (!) 171/105 97 %   06/18/16 1955 98.2 ??F (36.8 ??C) 88 20 (!) 181/101 100 %   06/18/16 1708 98.2 ??F (36.8 ??C) 88 20 (!) 188/107 98 %    06/18/16 1515 - 87 23 167/87 -   06/18/16 1510 - 86 22 165/89 -   06/18/16 1500 - 82 22 (!) 163/92 -   06/18/16 1455 - 80 20 (!) 164/103 -   06/18/16 1445 - 86 20 (!) 166/98 -   06/18/16 1435 - 85 20 (!) 163/94 -   06/18/16 1428 - 83 18 (!) 156/92 -       Intake/Output Summary (Last 24 hours) at 06/19/16 1230  Last data filed at 06/18/16 2112   Gross per 24 hour   Intake              240 ml   Output                0 ml   Net              240 ml        PHYSICAL  EXAM:  General: WD, WN. Alert, cooperative, no acute distress????  EENT:  EOMI. Anicteric sclerae. MMM  Resp:  CTA bilaterally, no wheezing or rales.  No accessory muscle use  CV:  Regular  rhythm,?? 1 to 2 plus  edema  GI:   distended abdomen but non tender  , Non tender. ??+Bowel sounds              Neurologic:?? Alert and oriented X 3, normal speech,   Psych:???? Good insight.??Not anxious nor agitated  Skin:  No rashes.  No jaundice    Reviewed most current lab test results and cultures  YES  Reviewed most current radiology test results   YES  Review and summation of old records today    NO  Reviewed patient's current orders and MAR    YES  PMH/SH reviewed - no change compared to H&P  ________________________________________________________________________  Care Plan discussed with:    Comments   Patient y    Family      RN y    Buyer, retailCare Manager     Consultant                        Multidiciplinary team rounds were held today with case manager, nursing, pharmacist and Higher education careers adviserclinical coordinator.  Patient's plan of care was discussed; medications were reviewed and discharge planning was addressed.     ________________________________________________________________________  Total NON critical care TIME:  30   Minutes    Total CRITICAL CARE TIME Spent:   Minutes non procedure based      Comments   >50% of visit spent in counseling and coordination of care     ________________________________________________________________________  Derrick Ravelheodros H Woodruff Skirvin, MD      Procedures: see electronic medical records for all procedures/Xrays and details which were not copied into this note but were reviewed prior to creation of Plan.      LABS:  I reviewed today's most current labs and imaging studies.  Pertinent labs include:  Recent Labs      06/16/16   2010   WBC  6.3   HGB  9.0*   HCT  29.1*   PLT  146*     Recent Labs      06/16/16   2010   NA  137   K  5.5*   CL  104   CO2  20*   GLU  110*   BUN  63*   CREA  12.00*   CA  7.3*   ALB  3.1*   TBILI  0.4   SGOT  22   ALT  19       Signed: Derrick Ravelheodros H Bonney Berres, MD

## 2016-06-19 NOTE — Progress Notes (Signed)
Lab called with calcium result of 6.1. I called and spoke with Dr. Warden FillersGebremeski. He asked me to let the nephrologist know. Zenaida NieceVan RN from HD is in the patients room currently performing dialysis. He is going to speak with Dr. Jamie BrookesAbou Assai and let him know the lab results.

## 2016-06-19 NOTE — Progress Notes (Signed)
Bedside and Verbal shift change report given to Marci (oncoming nurse) by [name] (offgoing nurse). Report included the following information SBAR, Kardex, Intake/Output, MAR, Recent Results and Med Rec Status.

## 2016-06-19 NOTE — Progress Notes (Signed)
Pt thought he was going to have HD yesterday and refused most of his blood pressure meds. His blood pressure has been high all evening and again this morning. He will be having dialysis this afternoon. He would only agree this am to take his hydralazine. Refused other bp meds.

## 2016-06-19 NOTE — Procedures (Signed)
[facility] [place]  [phone]    Vitals   Pre   Post   Assessment   Pre   Post     Temp  Temp: 98 F (36.7 C) (06/19/16 0739)  97.8 LOC  A&OX4 A&OX4   HR   Pulse (Heart Rate): 86 (06/19/16 1450) 95 Lungs   diminished  diminished   B/P   BP: (!) 167/104 (06/19/16 1450) 197/101 Cardiac   HRR  HRR   Resp   Resp Rate: 16 (06/19/16 1450) 16 Skin   intact  intact   Pain level  Pain Intensity 1: 10 (06/19/16 1320) 0 Edema      2+LEE 2+LEE   Orders:    Duration:   Start:   Procedure Start Time: 1450 End:   1715 Total:   2.5 hours   Dialyzer:   Dialyzer/Set Up Inspection: Revaclear (06/19/16 1450)   K Bath:   Dialysate K (mEq/L): 2 (06/19/16 1450)   Ca Bath:   Dialysate CA (mEq/L): 2.5 (06/19/16 1450)   Na/Bicarb:   Dialysate NA (mEq/L): 140 (06/19/16 1450)   Target Fluid Removal:   Goal/Amount of Fluid to Remove (mL): 3000 mL (06/19/16 1450)   Access     Type & Location:   [facility] permacath Ports of [place]/permcath disinfected with Alcohol per policy.  Each lumen aspirated for blood return and flushed with Normal Saline per policy.  Dialysis initiated.Central line catheter flushed with normal saline per policy.  Ports disinfected with Alcohol per policy and lines disconnected and capped using aseptic technique.  See LDA docflowsheet for dressing information.     Labs     Obtained/Reviewed   Critical Results Called   Date when labs were drawn-  Hgb-    HGB   Date Value Ref Range Status   06/16/2016 9.0 (L) 12.1 - 17.0 g/dL Final     K-    Potassium   Date Value Ref Range Status   06/16/2016 5.5 (H) 3.5 - 5.1 mmol/L Final     Ca-   Calcium   Date Value Ref Range Status   06/16/2016 7.3 (L) 8.5 - 10.1 MG/DL Final     Bun-   BUN   Date Value Ref Range Status   06/16/2016 63 (H) 6 - 20 MG/DL Final     Creat-   Creatinine   Date Value Ref Range Status   06/16/2016 12.00 (H) 0.70 - 1.30 MG/DL Final        Medications/ Blood Products Given     Name   Dose   Route and Time     none                Blood  Volume Processed (BVP):    49.8 liters Net Fluid   Removed:  1035 ml   Comments: Patients' bath changed to 2 K 3.0 Ca 45 minutes into tx, per Dr Janetta Hora. After patient was on machine about 1.5 hours he said that he would only run 3.5 hours because more tx time gave him a headache. At 2 hours into tx he said he was cramping and wanted to end tx. I talked him into running longer, gave some saline and turned off the uf. In another 1/2 hour he insisted on ending tx and I was unable to convince him to run longer. Tx ended and Dr Janetta Hora notified. No other problems Report given to Med Tele nurse, Neta Mends RN, using SBAR.  Time Out Done:  yes, 1450  Primary Nurse Rpt Pre: Cathlean Cower RN  Primary Nurse Rpt Post:Marci McCormick RN  Pt Education: Compliance  Care Plan: HD as ordered  Tx Summary: 45 minutes on 2 K 2.5 Ca and then 1 hour and 40 minutes on 2 K 3 Ca removed 1035 ml.  Admiting Diagnosis:  Pt's previous clinic-[facility]( he was fired from them)  Consent signed - Informed Consent Verified: Yes (06/19/16 1450)  [facility] Consent - yes  Hepatitis Status- Ag neg and Ab 15 as of 10/15/2015  Machine #- Machine Number: [id number] (06/19/16 1450)  Telemetry status-no  Pre-dialysis wt.- Pre-Dialysis Weight: 101 kg (222 lb 10.6 oz) (06/17/16 1855)

## 2016-06-20 MED ORDER — CARVEDILOL 12.5 MG TAB
12.5 mg | Freq: Two times a day (BID) | ORAL | Status: DC
Start: 2016-06-20 — End: 2016-06-21
  Administered 2016-06-20: 22:00:00 via ORAL

## 2016-06-20 MED ORDER — AMLODIPINE 5 MG TAB
5 mg | Freq: Every day | ORAL | Status: DC
Start: 2016-06-20 — End: 2016-06-21
  Administered 2016-06-20: 18:00:00 via ORAL

## 2016-06-20 MED ORDER — CARVEDILOL 12.5 MG TAB
12.5 mg | Freq: Two times a day (BID) | ORAL | Status: DC
Start: 2016-06-20 — End: 2016-06-20

## 2016-06-20 MED ORDER — PANTOPRAZOLE 40 MG TAB, DELAYED RELEASE
40 mg | Freq: Every day | ORAL | Status: DC
Start: 2016-06-20 — End: 2016-06-21
  Administered 2016-06-21: 14:00:00 via ORAL

## 2016-06-20 MED FILL — CARVEDILOL 12.5 MG TAB: 12.5 mg | ORAL | Qty: 1

## 2016-06-20 MED FILL — SEVELAMER CARBONATE 800 MG TAB: 800 mg | ORAL | Qty: 1

## 2016-06-20 MED FILL — VANCOMYCIN ORAL SOLUTION 50 MG/ML CPD (RX COMPOUNDED): 50 mg/mL | ORAL | Qty: 5

## 2016-06-20 MED FILL — HYDROMORPHONE 2 MG/ML INJECTION SOLUTION: 2 mg/mL | INTRAMUSCULAR | Qty: 1

## 2016-06-20 MED FILL — NITRO-BID 2 % TRANSDERMAL OINTMENT: 2 % | TRANSDERMAL | Qty: 1

## 2016-06-20 MED FILL — HYDRALAZINE 50 MG TAB: 50 mg | ORAL | Qty: 2

## 2016-06-20 MED FILL — AMLODIPINE 5 MG TAB: 5 mg | ORAL | Qty: 2

## 2016-06-20 MED FILL — HYDRALAZINE 20 MG/ML IJ SOLN: 20 mg/mL | INTRAMUSCULAR | Qty: 1

## 2016-06-20 MED FILL — BD POSIFLUSH NORMAL SALINE 0.9 % INJECTION SYRINGE: INTRAMUSCULAR | Qty: 10

## 2016-06-20 MED FILL — ONDANSETRON (PF) 4 MG/2 ML INJECTION: 4 mg/2 mL | INTRAMUSCULAR | Qty: 2

## 2016-06-20 MED FILL — FAMOTIDINE 20 MG TAB: 20 mg | ORAL | Qty: 1

## 2016-06-20 MED FILL — RISAQUAD 8 BILLION CELL CAPSULE: 8 billion cell | ORAL | Qty: 1

## 2016-06-20 NOTE — Progress Notes (Addendum)
Hospitalist Progress Note  NAME: David [name]   DOB:  23-May-1970   MRN:  [id number]       Assessment / Plan:  ESRD on Dialysis, no HD in 1 week with Mild Pulmonary Edema, Volume Overload and Hyperkalemia  Nephrology Inputs and recommendations Appreciated.  HD likely tomorrow.MWF  Had paracentesis 2 days ago and 4500 cc of yellow clear fluid were drained .will flow up fluid analysis     Diarrhea with history of C. diff  C. Diff came back positive.  Patient still having the diahhrea   On po  vancomycin pulse therapy   ID Inputs and recommendations Appreciated.      Abdominal Pain:c.diff might have contributed to the abdominal pain too.  Ascites: chronic, patient states that he even has history of Liver biopsy  S/p  paracentesis   Pain medication on dilaudid , pain seeking tendency seen, will have palliative care help on management.    Hypertension:  Resumed Norvasc for high blood pressure   continue home Coreg and Hydralazine  continue NTG paste for now  BP still high  after HD        Body mass index is 29.64 kg/(m^2).  GI prophylaxis on Protonix for acid reflux symptoms   Code status: Full  Prophylaxis: Hep SQ  Recommended Disposition: Home w/Family     Subjective:     Chief Complaint / Reason for Physician Visit  I am still having the abdominal pain .  Discussed with RN events overnight.     Review of Systems:  Symptom Y/N Comments  Symptom Y/N Comments   Fever/Chills n   Chest Pain n    Poor Appetite n   Edema y    Cough n   Abdominal Pain y    Sputum n   Joint Pain n    SOB/DOE n   Pruritis/Rash n    Nausea/vomit n   Tolerating PT/OT     Diarrhea y   Tolerating Diet     Constipation    Other       Could NOT obtain due to:      Objective:     VITALS:   Last 24hrs VS reviewed since prior progress note. Most recent are:  Patient Vitals for the past 24 hrs:   Temp Pulse Resp BP SpO2   06/20/16 0924 98.1 ??F (36.7 ??C) 95 20 (!) 197/106 99 %   06/19/16 2232 98.2 ??F (36.8 ??C) 94 18 (!) 179/104 98 %    06/19/16 1715 97.8 ??F (36.6 ??C) 95 16 (!) 197/101 -   06/19/16 1645 - 96 16 (!) 201/110 -   06/19/16 1545 - 91 16 (!) 203/97 -   06/19/16 1515 - 88 16 (!) 170/105 -   06/19/16 1450 97.7 ??F (36.5 ??C) 86 16 (!) 167/104 -       Intake/Output Summary (Last 24 hours) at 06/20/16 1146  Last data filed at 06/20/16 0129   Gross per 24 hour   Intake              720 ml   Output             1035 ml   Net             -315 ml        PHYSICAL EXAM:  General: WD, WN. Alert, cooperative, no acute distress????  EENT:  EOMI. Anicteric sclerae. MMM  Resp:  CTA bilaterally, no wheezing or rales.  No  accessory muscle use  CV:  Regular  rhythm,?? 1 to 2 plus  edema  GI:   distended abdomen but non tender  , Non tender. ??+Bowel sounds              Neurologic:?? Alert and oriented X 3, normal speech,   Psych:???? Good insight.??Not anxious nor agitated  Skin:  No rashes.  No jaundice    Reviewed most current lab test results and cultures  YES  Reviewed most current radiology test results   YES  Review and summation of old records today    NO  Reviewed patient's current orders and MAR    YES  PMH/SH reviewed - no change compared to H&P  ________________________________________________________________________  Care Plan discussed with:    Comments   Patient y    Family      RN y    Buyer, retailCare Manager     Consultant                        Multidiciplinary team rounds were held today with case manager, nursing, pharmacist and Higher education careers adviserclinical coordinator.  Patient's plan of care was discussed; medications were reviewed and discharge planning was addressed.     ________________________________________________________________________  Total NON critical care TIME:  30   Minutes    Total CRITICAL CARE TIME Spent:   Minutes non procedure based      Comments   >50% of visit spent in counseling and coordination of care     ________________________________________________________________________  Derrick Ravelheodros H Doreatha Offer, MD      Procedures: see electronic medical records for all procedures/Xrays and details which were not copied into this note but were reviewed prior to creation of Plan.      LABS:  I reviewed today's most current labs and imaging studies.  Pertinent labs include:  Recent Labs      06/19/16   1439   WBC  4.6   HGB  7.6*   HCT  24.7*   PLT  110*     Recent Labs      06/19/16   1439   NA  139   K  5.9*   CL  106   CO2  23   GLU  111*   BUN  66*   CREA  11.80*   CA  6.1*   MG  2.2   PHOS  6.0*   ALB  2.7*   TBILI  0.4   SGOT  23   ALT  15       Signed: Derrick Ravelheodros H Ameliana Brashear, MD

## 2016-06-20 NOTE — Progress Notes (Signed)
Bedside and Verbal shift change report given to Enjoli (oncoming nurse) by [name] (offgoing nurse). Report included the following information SBAR, Kardex, Intake/Output, MAR, Recent Results and Med Rec Status.

## 2016-06-20 NOTE — Progress Notes (Signed)
Attempted to draw one time labs ordered.  Pt stuck one time refusing to be stuck a second time

## 2016-06-21 LAB — CBC WITH AUTOMATED DIFF
ABS. BASOPHILS: 0 10*3/uL (ref 0.0–0.1)
ABS. EOSINOPHILS: 0.4 10*3/uL (ref 0.0–0.4)
ABS. IMM. GRANS.: 0 10*3/uL (ref 0.00–0.04)
ABS. LYMPHOCYTES: 0.4 10*3/uL — ABNORMAL LOW (ref 0.8–3.5)
ABS. MONOCYTES: 0.4 10*3/uL (ref 0.0–1.0)
ABS. NEUTROPHILS: 4 10*3/uL (ref 1.8–8.0)
ABSOLUTE NRBC: 0 10*3/uL (ref 0.00–0.01)
BASOPHILS: 0 % (ref 0–1)
EOSINOPHILS: 8 % — ABNORMAL HIGH (ref 0–7)
HCT: 25.4 % — ABNORMAL LOW (ref 36.6–50.3)
HGB: 7.8 g/dL — ABNORMAL LOW (ref 12.1–17.0)
IMMATURE GRANULOCYTES: 0 % (ref 0.0–0.5)
LYMPHOCYTES: 8 % — ABNORMAL LOW (ref 12–49)
MCH: 30.5 PG (ref 26.0–34.0)
MCHC: 30.7 g/dL (ref 30.0–36.5)
MCV: 99.2 FL — ABNORMAL HIGH (ref 80.0–99.0)
MONOCYTES: 8 % (ref 5–13)
MPV: 10.6 FL (ref 8.9–12.9)
NEUTROPHILS: 76 % — ABNORMAL HIGH (ref 32–75)
NRBC: 0 PER 100 WBC
PLATELET: 120 10*3/uL — ABNORMAL LOW (ref 150–400)
RBC: 2.56 M/uL — ABNORMAL LOW (ref 4.10–5.70)
RDW: 17.9 % — ABNORMAL HIGH (ref 11.5–14.5)
WBC: 5.2 10*3/uL (ref 4.1–11.1)

## 2016-06-21 LAB — METABOLIC PANEL, COMPREHENSIVE
A-G Ratio: 0.8 — ABNORMAL LOW (ref 1.1–2.2)
ALT (SGPT): 15 U/L (ref 12–78)
AST (SGOT): 22 U/L (ref 15–37)
Albumin: 2.7 g/dL — ABNORMAL LOW (ref 3.5–5.0)
Alk. phosphatase: 268 U/L — ABNORMAL HIGH (ref 45–117)
Anion gap: 10 mmol/L (ref 5–15)
BUN/Creatinine ratio: 6 — ABNORMAL LOW (ref 12–20)
BUN: 62 MG/DL — ABNORMAL HIGH (ref 6–20)
Bilirubin, total: 0.3 MG/DL (ref 0.2–1.0)
CO2: 23 mmol/L (ref 21–32)
Calcium: 7 MG/DL — ABNORMAL LOW (ref 8.5–10.1)
Chloride: 104 mmol/L (ref 97–108)
Creatinine: 10.3 MG/DL — ABNORMAL HIGH (ref 0.70–1.30)
GFR est AA: 7 mL/min/{1.73_m2} — ABNORMAL LOW (ref >60)
GFR est non-AA: 5 mL/min/{1.73_m2} — ABNORMAL LOW (ref >60)
Globulin: 3.5 g/dL (ref 2.0–4.0)
Glucose: 108 mg/dL — ABNORMAL HIGH (ref 65–100)
Potassium: 6.4 mmol/L — ABNORMAL HIGH (ref 3.5–5.1)
Protein, total: 6.2 g/dL — ABNORMAL LOW (ref 6.4–8.2)
Sodium: 137 mmol/L (ref 136–145)

## 2016-06-21 MED ORDER — HYDROMORPHONE 2 MG/ML INJECTION SOLUTION
2 mg/mL | Freq: Four times a day (QID) | INTRAMUSCULAR | Status: DC | PRN
Start: 2016-06-21 — End: 2016-06-21
  Administered 2016-06-21: 22:00:00 via INTRAVENOUS

## 2016-06-21 MED ORDER — OXYCODONE-ACETAMINOPHEN 5 MG-325 MG TAB
5-325 mg | Freq: Four times a day (QID) | ORAL | Status: DC | PRN
Start: 2016-06-21 — End: 2016-06-21

## 2016-06-21 MED FILL — HYDRALAZINE 50 MG TAB: 50 mg | ORAL | Qty: 2

## 2016-06-21 MED FILL — BD POSIFLUSH NORMAL SALINE 0.9 % INJECTION SYRINGE: INTRAMUSCULAR | Qty: 20

## 2016-06-21 MED FILL — VANCOMYCIN ORAL SOLUTION 50 MG/ML CPD (RX COMPOUNDED): 50 mg/mL | ORAL | Qty: 5

## 2016-06-21 MED FILL — SEVELAMER CARBONATE 800 MG TAB: 800 mg | ORAL | Qty: 1

## 2016-06-21 MED FILL — HYDROMORPHONE 2 MG/ML INJECTION SOLUTION: 2 mg/mL | INTRAMUSCULAR | Qty: 1

## 2016-06-21 MED FILL — PROTONIX 40 MG TABLET,DELAYED RELEASE: 40 mg | ORAL | Qty: 1

## 2016-06-21 MED FILL — VENTOLIN HFA 90 MCG/ACTUATION AEROSOL INHALER: 90 mcg/actuation | RESPIRATORY_TRACT | Qty: 8

## 2016-06-21 MED FILL — BD POSIFLUSH NORMAL SALINE 0.9 % INJECTION SYRINGE: INTRAMUSCULAR | Qty: 10

## 2016-06-21 MED FILL — AMLODIPINE 5 MG TAB: 5 mg | ORAL | Qty: 2

## 2016-06-21 MED FILL — CARVEDILOL 12.5 MG TAB: 12.5 mg | ORAL | Qty: 1

## 2016-06-21 MED FILL — RISAQUAD 8 BILLION CELL CAPSULE: 8 billion cell | ORAL | Qty: 1

## 2016-06-21 NOTE — Progress Notes (Signed)
Spiritual Care Assessment/Progress Note  [facility]      NAME: Laqueta Lindenroy D Linn      MRN: [id number]  AGE: 46 y.o. SEX: male  Religious Affiliation: Baptist   Language: English     06/21/2016     Total Time (in minutes): 5     Spiritual Assessment begun in [facility] 3 MED TELE through conversation with:         [] Patient        []  Family    []  Friend(s)        Reason for Consult: Palliative Care, Initial/Spiritual Assessment     Spiritual beliefs: (Please include comment if needed)     []  Involved in a faith tradition/spiritual practice:     []  Supported by a faith community:      []  Claims no spiritual orientation:      []  Seeking spiritual identity:           []  Adheres to an individual form of spirituality:      [x]  Not able to assess:                     Identified resources for coping:      []  Prayer                  []  Devotional reading               []  Music                  []  Guided Imagery     []  Family/friends                 []  Pet visits     []  Other:        Interventions offered during this visit: (See comments for more details)    Patient Interventions: Initial visit           Plan of Care:     []  Discuss Spiritual/Cultural needs    []  Support AMD and/or advance care planning process      []  Support grieving process   []  Coordinate Rites/Rituals    []  Coordination with community clergy   []  No spiritual needs identified at this time   []  Detailed Plan of Care below (See Comments)  []  Make referral to Music Therapy  []  Make referral to Pet Therapy     []  Make referral to Addiction services  []  [facility]  []  Make referral to Spiritual Care Partner  []  No future visits requested             Attempted Initial Spiritual Assessment in 3262 with Palliative pt. Pt soundly sleeping. Left card on tray table, and being familiar with pt from a previous visit, provided silent prayer on behalf of pt. Will follow up as able.  Jalecia Leon KempMendizabal, M.Div  Chaplain

## 2016-06-21 NOTE — ACP (Advance Care Planning) (Signed)
Advance Care Planning:  Advance Care Planning 06/21/2016   Patient's Healthcare Decision Maker is: Named in scanned ACP document   Primary Decision Maker Name David [name]   Primary Decision Maker Phone Number [phone]   Primary Decision Maker Relationship to Patient -   Secondary Decision Maker Name David [name]   Secondary Decision Maker Phone Number [phone]   Secondary Decision Maker Relationship to Patient -   Confirm Advance Directive Yes, on file   ??

## 2016-06-21 NOTE — Progress Notes (Cosign Needed)
Patient receiving dialysis in room

## 2016-06-21 NOTE — Procedures (Signed)
[facility] Dialysis Team [facility]  [id number]    Vitals   Pre   Post   Assessment   Pre   Post     Temp  Temp: 97.9 ??F (36.6 ??C) (06/21/16 1715) 98.4 LOC  A&Ox4 same   HR   Pulse (Heart Rate): 86 (06/21/16 1715) 86 Lungs   Coarse, expiratory wheezing same   B/P   BP: (!) 175/114 (06/21/16 1715) 182/101 Cardiac   HRR- S1, S2 present same    Resp   Resp Rate: 18 (06/21/16 1715) 18 Skin   CDI same    Pain level  10 8 Edema  2+ BLE     same   Orders:    Duration:   Start:   17:14 End:   18:47 Total:   1.5 h   Dialyzer:   Dialyzer/Set Up Inspection: Revaclear (06/21/16 1715)   K Bath:   Dialysate K (mEq/L): 2 (06/21/16 1715)   Ca Bath:   Dialysate CA (mEq/L): 2.5 (06/21/16 1715)   Na/Bicarb:   Dialysate NA (mEq/L): 140 (06/21/16 1715)   Target Fluid Removal:   Goal/Amount of Fluid to Remove (mL): 2500 mL (06/21/16 1715)   Access     Type & Location:   LIJ CVC: Dressing CDI. No s/s of infection. Both lumens aspirate & flush well. Running well at BFR 425.   Labs     Obtained/Reviewed   Critical Results Called   Date when labs were drawn-  Hgb-    HGB   Date Value Ref Range Status   06/19/2016 7.6 (L) 12.1 - 17.0 g/dL Final     K-    Potassium   Date Value Ref Range Status   06/19/2016 5.9 (H) 3.5 - 5.1 mmol/L Final     Ca-   Calcium   Date Value Ref Range Status   06/19/2016 6.1 (LL) 8.5 - 10.1 MG/DL Final     Comment:     RESULTS VERIFIED, PHONED TO AND READ BACK BY  LEWIS RN @ 1541 VDD       Bun-   BUN   Date Value Ref Range Status   06/19/2016 66 (H) 6 - 20 MG/DL Final     Creat-   Creatinine   Date Value Ref Range Status   06/19/2016 11.80 (H) 0.70 - 1.30 MG/DL Final        Medications/ Blood Products Given     Name   Dose   Route and Time     none                Blood Volume Processed (BVP):    34.8 L Net Fluid   Removed:  784 ml   Comments   Time Out Done: 17:13  Primary Nurse Rpt Pre: Ronna Polio RN  Primary Nurse Rpt PostRonna Polio RN   Pt Education: procedural, importance of taking BP meds  Care Plan: ongoing   Tx Summary:   SBAR received from Primary RN [employer]. Verified consent signed & on file. Arrived to patient room at 16:15 patient not present. Staff stated that he had walked off unit and they would attempt to retrieve him. Set up machine and tested water. Patient returned at 16:45. Patient BP 175/114 refusing to take BP medication. Reassessed BP now 187/105 Notified MD Abou-Assi at 17:07 who gave permission to initiate HD.  17:14 Both lumens of permcath disinfected with Alcohol per policy.  Each lumen aspirated for blood return and flushed with  Normal Saline per policy. Labs drawn per request/ order. Dialysis Tx initiated. Patient requesting pain medication.  17:24- 1 mg of dilaudid IV given by primary RN.   17:30- Patient BP 182/112, he states that taking BP meds before HD will cause hypotension  18:00- Pt resting quietly. No apparent distress.  18:19- Notified MD Drucilla Chalet that patient's Hgb is 7.8. No new orders received.  18:30- Pt resting quietly. No apparent distress. Obtained patient BP with manual cuff.  18:45- Patient stated he wanted to terminate tx. Notified primary RN. Enjoli and I encouraged him to complete his tx. Patient insisted to terminate tx and stated he wanted to leave AMA and go home [place] Notified MD Drucilla Chalet.  18:47- Tx ended. Rinsed back all possible blood.VSS. Central line catheter flushed with normal saline per policy. Ports disinfected with Alcohol per policy and lines disconnected and capped using aseptic technique. SBAR given to Primary, RN [employer].  Admiting Diagnosis: ESRD/ C-diff  Pt's previous clinic- TBD  Consent signed - Informed Consent Verified: Yes (06/21/16 7628)  [facility] Consent - on file  Hepatitis Status- neg, 10/15/15,   Machine #- Machine Number: [id number] (06/21/16 1715)  Telemetry status- n/a   Pre-dialysis wt.- Pre-Dialysis Weight: 101 kg (222 lb 10.6 oz) (06/17/16 1855)

## 2016-06-21 NOTE — Progress Notes (Addendum)
1800 Pt wants Dr. Selena BattenKim paged regarding Dilaudid order.  Wants to have the dilaudid increased for abdominal pain.  1845 Patient requesting to sign out AMA.  Explained the consequences of leaving against medical advice.  States that he understands and wants to stop his current dialysis treatment that is in process.  MD (Dr. Selena BattenKim and Dr. Ancil LinseyBomar) paged regarding his AMA request.  Dr. Ancil LinseyBomar will be up to sign AMA papers.  1900 pt has signed AMA papers, MDs aware that he is leaving. IV removed from left hand, no further questions at this time. Pt ambulating off of the unit.

## 2016-06-21 NOTE — Progress Notes (Signed)
Infectious Disease Progress Note        IMPRESSION:   1.  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 No observed  bowel movements while in the hospital ?colonization vs active disease.   2. ESRD on HD h/o missed dialysis prior to this admission  3. Ascites / H/o liver disease  4. HTN with elevated BP   ??  ??  PLAN:   1.  Vancomycin pulsed & tapered dosing( see dosing below) , probiotics for now.FMT ( fecal microbiota transfer would be treatment of choice for recurrent C.diff. This has been discussed on previous admission & recommendation for follow up at a center that does FMT ( [facility] )was made  2. Patient instructed again  to notify RN of bowel movements prior to flushing.   ??  Vancomycin pulse & taper??as follows :  Vancomycin 125 mg 4 times daily x 7 days  Vancomycin 125 mg bid x 7 days   Vancomycin 125 daily x 7 days  Vancomycin EODx 7 days  Vancomycin ??Every 3 days x 14 days  Subjective:     Pt seen ." My stomach hurts". Pt states he had 4 watery BMs . On asking his nurse I am told pt reported 2 movements , but had already flushed toilet bowl.    Review of Systems:  A comprehensive review of systems was negative except for that written in the History of Present Illness.        Objective:     Blood pressure (!) 179/109, pulse 89, temperature 98.1 ??F (36.7 ??C), resp. rate 18, height 6\' 1"  (1.854 m), weight 104.6 kg (230 lb 9.6 oz), SpO2 100 %.  Temp (24hrs), Avg:98.1 ??F (36.7 ??C), Min:97.9 ??F (36.6 ??C), Max:98.3 ??F (36.8 ??C)          Physical Exam:   General:  Resting , arousable   Eyes:  Sclera anicteric. Pupils equally round and reactive to light.   Mouth/Throat: Mucous membranes normal, oral pharynx clear   Neck: Supple   Lungs:   Reduced auscultation bases    CV:  Regular rate and rhythm,no murmur, click, rub or gallop   Abdomen:   Soft, ?-tender. bowel sounds +, mildly distended   Extremities: No cyanosis or edema   Skin: Skin color, texture, turgor normal. no acute rash or lesions    Lymph nodes: Cervical and supraclavicular normal   Musculoskeletal: No swelling or deformity   Lines/Devices:  Intact, no erythema, drainage or tenderness       Studies:      Lab Results   Component Value Date/Time    Culture result: NO GROWTH 3 DAYS 06/18/2016 02:40 PM    Culture result:  06/16/2016 08:27 PM     NO ROUTINE ENTERIC PATHOGENS ISOLATED INCLUDING SALMONELLA, SHIGELLA, YERSINIA, VIBRIO OR SHIGA TOXIN PRODUCING E. COLI    Culture result:  05/22/2016 12:00 PM     NO ROUTINE ENTERIC PATHOGENS ISOLATED INCLUDING SALMONELLA, SHIGELLA, YERSINIA, VIBRIO OR E. COLI 0157:H7    Culture result: NO COLIFORMS ISOLATED 05/22/2016 12:00 PM    Culture result: HEAVY  GRAM POSITIVE FLORA ISOLATED   05/22/2016 12:00 PM          XR Results (most recent):    Results from Hospital Encounter encounter on 06/16/16   XR CHEST PORT   Narrative EXAM:  XR CHEST PORT    INDICATION:  End-stage renal disease on dialysis. Missed dialysis. Abdominal  pain and diarrhea. Previous Clostridium difficile.  COMPARISON: Portable chest on 05/21/2016    TECHNIQUE: Upright portable chest AP digital view    FINDINGS: [Dialysis catheter is unchanged. Right subclavian vascular stent is  unchanged. Cardiomegaly and aortic atherosclerosis are unchanged. The pulmonary  vasculature is within normal limits.     Mild interstitial pulmonary edema is increased. Probable small bilateral pleural  effusions. No pneumothorax. No evidence of pneumonia. The visualized bones and  upper abdomen are age-appropriate.         Impression IMPRESSION:    Increased mild interstitial pulmonary edema. No pneumothorax.          Patient Active Problem List   Diagnosis Code   ??? Gastroenteritis K52.9   ??? Peritonitis (HCC) K65.9   ??? Recurrent Clostridium difficile diarrhea A04.71   ??? Ascites R18.8   ??? ESRD (end stage renal disease) on dialysis (HCC) N18.6, Z99.2   ??? H/O noncompliance with medical treatment, presenting hazards to health Z91.19    ??? Diarrhea of infectious origin A09   ??? Left arm swelling M79.89   ??? Pain in left upper arm M79.622   ??? C. difficile colitis A04.72   ??? Hyperkalemia E87.5   ??? NSTEMI (non-ST elevated myocardial infarction) (HCC) I21.4   ??? Volume overload E87.70         ICD-10-CM ICD-9-CM    1. Diarrhea of presumed infectious origin A09 009.3    2. Pulmonary edema, acute (HCC) J81.0 518.4    3. Noncompliance Z91.19 V15.81        I have discussed the diagnosis with the patient and the intended plan as seen in the above orders.     I have discussed medication side effects and warnings with the patient as well.    Reviewed test results at length with patient    Anti-infectives:      Vancomycin po D5     Richardean Chimera, MD Jerrel Ivory

## 2016-06-21 NOTE — Other (Signed)
Bedside shift change report given to Enjoli,RN (oncoming nurse) by Hannah,RN (offgoing nurse). Report included the following information SBAR, Kardex, Intake/Output, MAR, Accordion, Recent Results and Med Rec Status.

## 2016-06-21 NOTE — Progress Notes (Signed)
Called by David [name], pt's bedside RN.   Pt reported that the new Dilaudid prn dosing schedule is not going to be adequate.   Discussed w/ bedside RN given concern for dispo planning including opioid w/d.    Time and input appreciated.     Thereasa ParkinEugene Anijah Spohr, MD  Palliative Care Team

## 2016-06-21 NOTE — Consults (Signed)
Palliative Medicine Consult  [place]: [phone])  Patient Name: David [name]  Date of Birth: [birth year]    Date of Initial Consult: 06/21/2016  Reason for Consult: pain mgmt  Requesting Provider: pain mgmt   Primary Care Physician: None     SUMMARY:   ZAHKI HOOGENDOORN is a 46 y.o. gentleman w/ PMH most significant for ESRD on HD, HTN, h/o C diff colitis who was admitted on 06/16/2016 for abd pain.  Current medical issues leading to Palliative Medicine involvement include: pain mgmt     PALLIATIVE DIAGNOSES:   1. Abd pain  2. Flank pain   3. Debility  4. Medical noncompliance     PLAN:   1. Visited w/ pt in his rm.  Current HD session was just started.  Dialysis RN at bedside.  No family at bedside  2. Discussed w/ pt about weaning opioids given that the plan is for pt to be discharged w/o any opioid regimen.  I presented option of decreasing IV Dilaudid prn dose amt and leave at q4h prn; another option of changing Dilaudid 1 mg IV q4h prn to Dilaudid 1 mg IV q6h prn.  Pt wished for the latter but insisted that he is able to get the next dose at 1715.    3. [facility].  Report c/w what pt reports.   4. For reference, pt received 6 of 6 doses of Dilaudid 1 mg IV q4h prn severe pain.    Dilaudid 6 mg IV is 120 OME.   5. Change Dilaudid 1 mg IV q4h prn severe pain to Dilaudid 1 mg IV q6h prn severe pain  6. Change Percocet 5-325 mg tabs #1 PO q4h prn moderate pain to Percocet 5-325 mg tabs #1 PO q6h prn moderate pain  Oxycodone 5 mg is 7.5 OME.  7. Discussed w/ Dr. Sharlette Dense, primary medical team attending.  Time and input appreciated.   8. Discussed w/ Ailene Ravel, Advertising copywriter.  Time and input appreciated.   9. Communicated plan of care with: Palliative IDT     GOALS OF CARE / TREATMENT PREFERENCES:     GOALS OF CARE:   not discussed  Assumed to be for full, restorative treatment and all measures that support this    TREATMENT PREFERENCES:   Code Status: Full Code    Advance Care Planning:   Advance Care Planning 06/21/2016   Patient's Healthcare Decision Maker is: Named in scanned ACP document   Primary Decision Maker Name Darvin Dials   Primary Decision Maker Phone Number [phone]   Primary Decision Maker Relationship to Patient -   Secondary Decision Maker Name [name]   Secondary Decision Maker Phone Number [phone]   Secondary Decision Maker Relationship to Patient -   Confirm Advance Directive Yes, on file           Other Instructions: none at this time        Other:    As far as possible, the palliative care team has discussed with patient / health care proxy about goals of care / treatment preferences for patient.           HISTORY:     History obtained from: pt, chart, primary team    CHIEF COMPLAINT: abd pain    HPI/SUBJECTIVE:    The patient is:   [x] Verbal and participatory  [] Non-participatory due to:   Pt is a 46 y/o AAM w/ PMH noted above who presented to our ED w/  abd pain and diarrhea.      Pt is apparently noncompliant w/ HD.  He has been in the hospital several times and has left AMA in the past.  Pt has had worsening abd pain.  Pt reported having C diff "11 times", 6+ BMs per day for the past 5 days.  Pt is not able to keep anything down PO.  Abd pain is sharp.  +SOB, BLE swelling.      Pt had reported last HD session 1 wk ago.     In our ED, imaging significant for anasarca, mod amt of ascites, mild pulm edema.   Pt was admitted for further w/u and mgmt.    Clinical Pain Assessment (nonverbal scale for severity on nonverbal patients):   Clinical Pain Assessment  Severity: 9         [++++ Clinical Pain Assessment++++]  [++++Pain Severity++++]: Pain: 9  [++++Pain Character++++]: sharp  [++++Pain Duration++++]: several days  [++++Pain Effect++++]: functional  [++++Pain Factors++++]: Dilaudid reduces pain from as high as 10 to 5-6  [++++Pain Frequency++++]: persistent but varies in intensity  [++++Pain Location++++]: mid abd and radiates to R flank and into back   [++++ Clinical Pain Assessment++++]      Duration: for how long has pt been experiencing pain (e.g., 2 days, 1 month, years)  Frequency: how often pain is an issue (e.g., several times per day, once every few days, constant)     FUNCTIONAL ASSESSMENT:     Palliative Performance Scale (PPS):  PPS: 50       PSYCHOSOCIAL/SPIRITUAL SCREENING:     Palliative IDT has assessed this patient for cultural preferences / practices and a referral made as appropriate to needs (Cultural Services, Patient Advocacy, Ethics, etc.)    Advance Care Planning:  Advance Care Planning 06/21/2016   Patient's Healthcare Decision Maker is: Named in scanned ACP document   Primary Decision Maker Name Beckhem Isadore   Primary Decision Maker Phone Number [phone]   Primary Decision Maker Relationship to Patient -   Secondary Decision Maker Name [name]   Secondary Decision Maker Phone Number [phone]   Secondary Decision Maker Relationship to Patient -   Confirm Advance Directive Yes, on file       Any spiritual / religious concerns:  [] Yes /  [x] No    Caregiver Burnout:  [] Yes /  [] No /  [x] No Caregiver Present      Anticipatory grief assessment:   [] Normal  / [] Maladaptive       ESAS Anxiety: Anxiety: 0    ESAS Depression: Depression: 0        REVIEW OF SYSTEMS:     Positive and pertinent negative findings in ROS are noted above in HPI.  The following systems were [x] reviewed / [] unable to be reviewed as noted in HPI  Other findings are noted below.  Systems: constitutional, ears/nose/mouth/throat, respiratory, gastrointestinal, genitourinary, musculoskeletal, integumentary, neurologic, psychiatric, endocrine. Positive findings noted below.  Modified ESAS Completed by: provider   Fatigue: 0 Drowsiness: 0   Depression: 0 Pain: 9   Anxiety: 0 Nausea: 0   Anorexia: 0 Dyspnea: 0           Stool Occurrence(s): 1        PHYSICAL EXAM:     From RN flowsheet:  Wt Readings from Last 3 Encounters:   06/21/16 230 lb 9.6 oz (104.6 kg)    05/23/16 227 lb 1.2 oz (103 kg)  05/21/16 270 lb (122.5 kg)     Blood pressure (!) 175/114, pulse 86, temperature 97.9 ??F (36.6 ??C), temperature source Oral, resp. rate 18, height 6' 1" (1.854 m), weight 230 lb 9.6 oz (104.6 kg), SpO2 100 %.    Pain Scale 1: Numeric (0 - 10)  Pain Intensity 1: 9     Pain Location 1: Abdomen  Pain Orientation 1: Lower  Pain Description 1: Aching  Pain Intervention(s) 1: Medication (see MAR)  Last bowel movement, if known:     Gen: sitting up in bed, appears comfortable, in NAD  HEENT: NC/AT, wearing glasses  Respiratory: breathing not labored, symmetric  Neurologic: awake, alert, following commands, moving all extremities  Psychiatric: appropriate, no hallucinations  No delusions  No obvious signs of agitation or anxiety  Per Appelbaum criteria, pt demonstrates capacity regarding pain mgmt.  More specifically,   -pt demonstrates understanding of the decision/choice communicated and is able to indicate a decision/choice  -pt demonstrates understanding of relevant information.  Pt is able to paraphrase shared information.   -pt appreciates situation and its consequences.   -pt demonstrates the ability to reason about options including comparing options and consequences and provide reasons for selection of option       HISTORY:     Active Problems:    ESRD (end stage renal disease) on dialysis ([place]) (03/04/2016)      Volume overload (06/16/2016)      Past Medical History:   Diagnosis Date   ??? Adverse effect of anesthesia     sleep apnea uses oxygen at night    ??? Ascites    ??? Asthma    ??? C. difficile colitis    ??? CKD (chronic kidney disease) stage V requiring chronic dialysis ([place])    ??? HTN (hypertension)    ??? SBP (spontaneous bacterial peritonitis) [facility])       Past Surgical History:   Procedure Laterality Date   ??? COLONOSCOPY N/A 03/04/2016    COLONOSCOPY performed by Macie Burows, MD at [facility]   ??? HX HERNIA REPAIR     ??? SIGMOIDOSCOPY,DIAGNOSTIC  03/04/2016            Family History   Problem Relation Age of Onset   ??? Hypertension Mother       History reviewed, no pertinent family history.  Social History   Substance Use Topics   ??? Smoking status: Heavy Tobacco Smoker     Packs/day: 0.50     Years: 10.00   ??? Smokeless tobacco: Never Used   ??? Alcohol use No     Allergies   Allergen Reactions   ??? Lisinopril Shortness of Breath   ??? Morphine Shortness of Breath   ??? Toradol [Ketorolac] Shortness of Breath      Current Facility-Administered Medications   Medication Dose Route Frequency   ??? HYDROmorphone (DILAUDID) injection 1 mg  1 mg IntraVENous Q6H PRN   ??? oxyCODONE-acetaminophen (PERCOCET) 5-325 mg per tablet 1 Tab  1 Tab Oral Q6H PRN   ??? carvedilol (COREG) tablet 12.5 mg  12.5 mg Oral BID WITH MEALS   ??? amLODIPine (NORVASC) tablet 10 mg  10 mg Oral DAILY   ??? pantoprazole (PROTONIX) tablet 40 mg  40 mg Oral ACB   ??? hydrALAZINE (APRESOLINE) 20 mg/mL injection 10 mg  10 mg IntraVENous Q2H PRN   ??? lactobac ac& pc-s.therm-b.anim (FLORA Q/RISAQUAD)  1 Cap Oral DAILY   ??? vancomycin 50 mg/mL oral solution (compounded) 125 mg  125 mg Oral Q6H   ??? heparin (porcine) injection 5,000 Units  5,000 Units SubCUTAneous Q12H   ??? nitroglycerin (NITROBID) 2 % ointment 1 Inch  1 Inch Topical BID   ??? albuterol (PROVENTIL HFA, VENTOLIN HFA, PROAIR HFA) inhaler 2 Puff  2 Puff Inhalation Q4H PRN   ??? hydrALAZINE (APRESOLINE) tablet 100 mg  100 mg Oral TID   ??? sevelamer carbonate (RENVELA) tab 800 mg  800 mg Oral TID WITH MEALS   ??? sodium chloride (NS) flush 5-10 mL  5-10 mL IntraVENous Q8H   ??? sodium chloride (NS) flush 5-10 mL  5-10 mL IntraVENous PRN   ??? acetaminophen (TYLENOL) tablet 650 mg  650 mg Oral Q4H PRN   ??? naloxone (NARCAN) injection 0.4 mg  0.4 mg IntraVENous PRN   ??? diphenhydrAMINE (BENADRYL) capsule 25 mg  25 mg Oral Q4H PRN   ??? ondansetron (ZOFRAN) injection 4 mg  4 mg IntraVENous Q4H PRN          LAB AND IMAGING FINDINGS:     Lab Results   Component Value Date/Time     WBC 4.6 06/19/2016 02:39 PM    HGB 7.6 (L) 06/19/2016 02:39 PM    PLATELET 110 (L) 06/19/2016 02:39 PM     Lab Results   Component Value Date/Time    Sodium 139 06/19/2016 02:39 PM    Potassium 5.9 (H) 06/19/2016 02:39 PM    Chloride 106 06/19/2016 02:39 PM    CO2 23 06/19/2016 02:39 PM    BUN 66 (H) 06/19/2016 02:39 PM    Creatinine 11.80 (H) 06/19/2016 02:39 PM    Calcium 6.1 (LL) 06/19/2016 02:39 PM    Magnesium 2.2 06/19/2016 02:39 PM    Phosphorus 6.0 (H) 06/19/2016 02:39 PM      Lab Results   Component Value Date/Time    AST (SGOT) 23 06/19/2016 02:39 PM    Alk. phosphatase 275 (H) 06/19/2016 02:39 PM    Protein, total 5.8 (L) 06/19/2016 02:39 PM    Albumin 2.7 (L) 06/19/2016 02:39 PM    Globulin 3.1 06/19/2016 02:39 PM     Lab Results   Component Value Date/Time    INR 1.2 (H) 05/21/2016 05:13 PM    Prothrombin time 11.5 (H) 05/21/2016 05:13 PM    aPTT 34.5 (H) 05/22/2016 09:20 AM      Lab Results   Component Value Date/Time    Iron 116 05/22/2016 03:18 AM    TIBC 217 (L) 05/22/2016 03:18 AM    Iron % saturation 53 (H) 05/22/2016 03:18 AM    Ferritin 1676 (H) 05/22/2016 03:18 AM      No results found for: PH, PCO2, PO2  No components found for: [facility]   Lab Results   Component Value Date/Time    CK 579 (H) 05/21/2016 05:08 PM    CK - MB 17.4 (H) 05/21/2016 05:08 PM                Total time: 55 min  Counseling / coordination time, spent as noted above: 35 min  > 50% counseling / coordination?: yes    Prolonged service was provided for  []30 min   []75 min in face to face time in the presence of the patient, spent as noted above.  Time Start:   Time End:   Note: this can only be billed with [phone] (initial) or [phone] (follow up).  If multiple start / stop times, list each separately.

## 2016-06-21 NOTE — Progress Notes (Signed)
Hospitalist Progress Note  NAME: Laqueta Lindenroy D Faso   DOB:  04/11/1971   MRN:  [id number]       Assessment / Plan:  46yo BM with abdominal pain secondary to recurrent C. diff colitis    Recurrent C. diff colitis  -WBC 4.6continue ID recommendations with PO vancomycin  -Pt will need fecal transplant which will be difficult as he has a h/o noncompliance. Referral to [facility] has been placed as with previous admission    Abdominal pain  -possibly due to C. Diff, ascites, or a Palliative was consulted will continue recommendations regarding weaning narcotics. Per chart review, Pt has pain seeking behavior. PMP checked and he does not have excessive prescriptions for narcotics. 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  -continue sevelamer  -will obtain PTH, free Ca2+, vitamin D, and phosphorus with next HD     Normocytic anemia  -H/H 7.6/24.7  -likely due to his ESRD  -will monitor    End-stage renal disease-hemodialysis  -cotinue Nephrology recommendations with HD (M,W,F)    Hypertension  -continue amlodipine, hydralazine, and carvedilol    Asthma, not in acute exacerbation  -continue PRN albuyterol    Obesity (BMI 30.42)  -dietary and lifestyle modification counseling      GI prophylaxis on Protonix for acid reflux symptoms   Code status: Full  Prophylaxis: heparin  Recommended Disposition: Home w/Family     Subjective:     Chief Complaint / Reason for Physician Visit  Pt c/o abdominal pain that is constant with no alleviating or aggravating factors but is associated with nausea. He continues to have watery stools. No adverse events overnight. He denies chest pain, SOB, hematuria, melena, or hematemesis.    Review of Systems:  Symptom Y/N Comments  Symptom Y/N Comments   Fever/Chills n   Chest Pain n     Poor Appetite n   Edema y    Cough n   Abdominal Pain y    Sputum n   Joint Pain n    SOB/DOE n   Pruritis/Rash n    Nausea/vomit y   Tolerating PT/OT     Diarrhea y   Tolerating Diet     Constipation    Other       Could NOT obtain due to:      Objective:     VITALS:   Last 24hrs VS reviewed since prior progress note. Most recent are:  Patient Vitals for the past 24 hrs:   Temp Pulse Resp BP SpO2   06/21/16 1730 - 89 18 (!) 182/112 -   06/21/16 1715 97.9 ??F (36.6 ??C) 86 18 (!) 175/114 100 %   06/21/16 1233 98.1 ??F (36.7 ??C) 89 18 (!) 179/109 100 %   06/21/16 0905 97.9 ??F (36.6 ??C) 89 17 117/72 100 %   06/20/16 2341 98.3 ??F (36.8 ??C) 85 18 (!) 153/94 100 %     No intake or output data in the 24 hours ending 06/21/16 1801     PHYSICAL EXAM:  Gen: BM, lying in bed, NAD  HENT: NC/AT, oral mucosa pink and moist  Eyes: PERRL, anicteric sclera, conjunctiva pink and moist  CVS: RRR, s1/s2 audible, no m/g/r, B/L LE edema, radial/DP pulses 2+ B/L  Lung: CTAB, no w/r/r  Abd: mildly obese, BS present, NT/ND,  no HSM  Neuro: AAOx4, CN 2-12 grossly intact, biceps/patella DTRs 2+ B/L, muscle strength 5/5 UE/LE B/L    Reviewed most current lab test results and cultures  YES  Reviewed most current radiology test results   YES  Review and summation of old records today    NO  Reviewed patient's current orders and MAR    YES  PMH/SH reviewed - no change compared to H&P  ________________________________________________________________________  Care Plan discussed with:    Comments   Patient y    Family      RN y    Buyer, retail  y Palliative Care                     Multidiciplinary team rounds were held today with case manager, nursing, pharmacist and clinical coordinator.  Patient's plan of care was discussed; medications were reviewed and discharge planning was addressed.     ________________________________________________________________________  Total NON critical care TIME:  30   Minutes     Total CRITICAL CARE TIME Spent:   Minutes non procedure based      Comments   >50% of visit spent in counseling and coordination of care     ________________________________________________________________________  Cherlynn Kaiser, MD     Procedures: see electronic medical records for all procedures/Xrays and details which were not copied into this note but were reviewed prior to creation of Plan.      LABS:  I reviewed today's most current labs and imaging studies.  Pertinent labs include:  Recent Labs      06/19/16   1439   WBC  4.6   HGB  7.6*   HCT  24.7*   PLT  110*     Recent Labs      06/19/16   1439   NA  139   K  5.9*   CL  106   CO2  23   GLU  111*   BUN  66*   CREA  11.80*   CA  6.1*   MG  2.2   PHOS  6.0*   ALB  2.7*   TBILI  0.4   SGOT  23   ALT  15       Signed: Cherlynn Kaiser, MD

## 2016-06-21 NOTE — Progress Notes (Signed)
[facility] Progress Note        NAME: David [name]       DOB:  12-20-1970       MRN:  [id number]     Date/Time: 06/21/2016    Risk of deterioration: low     ??                      Assessment:                                                                            Plan:  ESRD  HTN  CP-had stress 2017 at [facility]  N/V/Diarrhea-seems to be chronic--and hard to demonstrate in the hospital  Anemia  Hx of Cdiff--last 2 tests here (-) now (+)  Hx of hep b and ascites  HX of subclavian stenosis Pt discharged from [name] due to non compliance.   Cdiff (+) again  S/P paracentesis in 11/17- for 3.6 l/fluid,  HD again today       ??  Last admission at [name] he left AMA before getting dialysis. There is a reason he comes here and not [place] where he lives.    We are not accepting him to any of our units. Pt asked about the [place] unit-also there is a new unit on [address]-he can check these two out.    [facility] [address]-only dialyzed for 2 hours yesterday due to "cramping"-holding hd today due to paracentesis, HD tomorrow-         Subjective:     Chief Complaint:  Non compliant     Objective:     VITALS:   Last 24hrs VS reviewed since prior progress note. Most recent are:  Visit Vitals   ??? BP 117/72   ??? Pulse 89   ??? Temp 97.9 ??F (36.6 ??C)   ??? Resp 17   ??? Ht 6\' 1"  (1.854 m)   ??? Wt 104.6 kg (230 lb 9.6 oz)   ??? SpO2 100%   ??? BMI 30.42 kg/m2     SpO2 Readings from Last 6 Encounters:   06/21/16 100%   05/22/16 98%   05/21/16 97%   05/17/16 97%   03/09/16 96%   02/12/16 100%    O2 Flow Rate (L/min): 2 l/min (prn)     No intake or output data in the 24 hours ending 06/21/16 1045     PHYSICAL EXAM:    General  resting  ____________________________________________________________________________________________    Attending Physician: Sharlee BlewWalid G Abou-Assi, MD     ____________________________________________________  MEDICATIONS:  Current Facility-Administered Medications    Medication Dose Route Frequency   ??? carvedilol (COREG) tablet 12.5 mg  12.5 mg Oral BID WITH MEALS   ??? amLODIPine (NORVASC) tablet 10 mg  10 mg Oral DAILY   ??? pantoprazole (PROTONIX) tablet 40 mg  40 mg Oral ACB   ??? hydrALAZINE (APRESOLINE) 20 mg/mL injection 10 mg  10 mg IntraVENous Q2H PRN   ??? lactobac ac& pc-s.therm-b.anim (FLORA Q/RISAQUAD)  1 Cap Oral DAILY   ??? vancomycin 50 mg/mL oral solution (compounded) 125 mg  125 mg Oral Q6H   ??? heparin (porcine)  injection 5,000 Units  5,000 Units SubCUTAneous Q12H   ??? nitroglycerin (NITROBID) 2 % ointment 1 Inch  1 Inch Topical BID   ??? albuterol (PROVENTIL HFA, VENTOLIN HFA, PROAIR HFA) inhaler 2 Puff  2 Puff Inhalation Q4H PRN   ??? hydrALAZINE (APRESOLINE) tablet 100 mg  100 mg Oral TID   ??? sevelamer carbonate (RENVELA) tab 800 mg  800 mg Oral TID WITH MEALS   ??? sodium chloride (NS) flush 5-10 mL  5-10 mL IntraVENous Q8H   ??? sodium chloride (NS) flush 5-10 mL  5-10 mL IntraVENous PRN   ??? acetaminophen (TYLENOL) tablet 650 mg  650 mg Oral Q4H PRN   ??? oxyCODONE-acetaminophen (PERCOCET) 5-325 mg per tablet 1 Tab  1 Tab Oral Q4H PRN   ??? HYDROmorphone (DILAUDID) injection 1 mg  1 mg IntraVENous Q4H PRN   ??? naloxone (NARCAN) injection 0.4 mg  0.4 mg IntraVENous PRN   ??? diphenhydrAMINE (BENADRYL) capsule 25 mg  25 mg Oral Q4H PRN   ??? ondansetron (ZOFRAN) injection 4 mg  4 mg IntraVENous Q4H PRN        LABS:  Recent Labs      06/19/16   1439   WBC  4.6   HGB  7.6*   HCT  24.7*   PLT  110*     Recent Labs      06/19/16   1439   NA  139   K  5.9*   CL  106   CO2  23   BUN  66*   CREA  11.80*   GLU  111*   CA  6.1*   MG  2.2   PHOS  6.0*     Recent Labs      06/19/16   1439   SGOT  23   ALT  15   AP  275*   TBILI  0.4   TP  5.8*   ALB  2.7*   GLOB  3.1     No results for input(s): INR, PTP, APTT in the last 72 hours.    No lab exists for component: INREXT, INREXT   No results for input(s): FE, TIBC, PSAT, FERR in the last 72 hours.    No results for input(s): PH, PCO2, PO2 in the last 72 hours.  No results for input(s): CPK, CKNDX, TROIQ in the last 72 hours.    No lab exists for component: CPKMB  Lab Results   Component Value Date/Time    Glucose (POC) 96 03/04/2016 02:04 PM    Glucose (POC) 105 (H) 02/29/2016 11:05 AM

## 2016-06-21 NOTE — Discharge Summary (Signed)
Discharge Summary       PATIENT ID: David [name]  MRN: [id number]   DATE OF BIRTH: [birth year]    DATE OF ADMISSION: 06/16/2016  7:57 PM    DATE OF DISCHARGE: 06/21/2016   PRIMARY CARE PROVIDER: None     ATTENDING PHYSICIAN: [name], MD  DISCHARGING PROVIDER: [name], MD    To contact this individual call [phone] and ask the operator to page.  If unavailable ask to be transferred the Adult Hospitalist Department.    CONSULTATIONS: IP CONSULT TO NEPHROLOGY  IP CONSULT TO INFECTIOUS DISEASES  IP CONSULT TO PALLIATIVE CARE - PROVIDER    PROCEDURES/SURGERIES: * No surgery found *    ADMITTING DIAGNOSES & HOSPITAL COURSE:   Pt was pulsed vancomycin and place on taper per ID recommendations. Further recommendations were that he will need fecal transplant. Referral to [facility] placed as with previous admission.   Nephrology consulted for his ESRD requiring HD    His abdominal pain was difficult to deal with. He was placed on Dilaudid q4hr and received every dose possible. Palliative Care was consulted and spoke with Pt regarding weaning. Pain in general could be due to secondary hyperparathyroidism given his ESRD (in combination with C. diff colitis) however Pt signed out AMA prior to workup. Corrected Ca2+ 7.14. Pt did have normocytic anemia but did not require transfusion.    ??Pt signed out AMA after receiving hemodialysis.        DISCHARGE DIAGNOSES / PLAN:      1. Recurrent C. diff colitis  2. Abdominal pain  3. Hypocalcemia  4. Normocytic anemia  5. End-stage renal disease-hemodialysis  6. Hypertension  7. Asthma, not in acute exacerbation  8. Obesity (BMI 30.42)       PENDING TEST RESULTS:   At the time of discharge the following test results are still pending: none    FOLLOW UP APPOINTMENTS:    Follow-up Information     None         ADDITIONAL CARE RECOMMENDATIONS: none    DIET: Cardiac Diet and Renal Diet    ACTIVITY: Activity as tolerated    WOUND CARE: none    EQUIPMENT needed: none       DISCHARGE MEDICATIONS:  Discharge Medication List as of 06/21/2016  7:13 PM            NOTIFY YOUR PHYSICIAN FOR ANY OF THE FOLLOWING:   Fever over 101 degrees for 24 hours.   Chest pain, shortness of breath, fever, chills, nausea, vomiting, diarrhea, change in mentation, falling, weakness, bleeding. Severe pain or pain not relieved by medications.  Or, any other signs or symptoms that you may have questions about.    DISPOSITION:    Home With:   OT  PT  HH  RN       Long term SNF/Inpatient Rehab    Independent/assisted living    Hospice   x Other: AMA       PATIENT CONDITION AT DISCHARGE:     Functional status    Poor     Deconditioned    x Independent      Cognition   x  Lucid     Forgetful     Dementia      Catheters/lines (plus indication)    Foley     PICC     PEG    x None      Code status   x  Full code  DNR      PHYSICAL EXAMINATION AT DISCHARGE:   Refer to Progress Note 06/21/2016      CHRONIC MEDICAL DIAGNOSES:  Problem List as of 06/21/2016  Date Reviewed: 2016/07/15          Codes Class Noted - Resolved    Volume overload ICD-10-CM: E87.70  ICD-9-CM: 276.69  06/16/2016 - Present        NSTEMI (non-ST elevated myocardial infarction) (HCC) ICD-10-CM: I21.4  ICD-9-CM: 410.70  05/22/2016 - Present        C. difficile colitis ICD-10-CM: A04.72  ICD-9-CM: 008.45  05/15/2016 - Present        Hyperkalemia ICD-10-CM: E87.5  ICD-9-CM: 276.7  05/15/2016 - Present        H/O noncompliance with medical treatment, presenting hazards to health ICD-10-CM: Z91.19  ICD-9-CM: V15.81  03/09/2016 - Present        Diarrhea of infectious origin ICD-10-CM: A09  ICD-9-CM: 009.3  03/09/2016 - Present        Left arm swelling ICD-10-CM: M79.89  ICD-9-CM: 729.81  03/09/2016 - Present        Pain in left upper arm ICD-10-CM: R60.454  ICD-9-CM: 729.5  03/09/2016 - Present        Ascites ICD-10-CM: R18.8  ICD-9-CM: 789.59  03/04/2016 - Present        ESRD (end stage renal disease) on dialysis [facility] ICD-10-CM: N18.6, Z99.2   ICD-9-CM: 585.6, V45.11  03/04/2016 - Present        Recurrent Clostridium difficile diarrhea ICD-10-CM: A04.71  ICD-9-CM: 008.45  02/29/2016 - Present        Peritonitis (HCC) ICD-10-CM: K65.9  ICD-9-CM: 567.9  11/21/2015 - Present        Gastroenteritis ICD-10-CM: K52.9  ICD-9-CM: 558.9  10/14/2015 - Present              Greater than 30 minutes were spent with the patient on counseling and coordination of care    Signed:   Frazier Richards IV, MD  06/21/2016  7:24 PM

## 2016-06-21 NOTE — Consults (Signed)
Palliative Medicine Consult  [place]: [phone])    Patient Name: David [name]  Date of Birth: 1970/06/01    Date of Initial Consult: 06/21/2016  Reason for Consult: pain mgmt  Requesting Provider: pain mgmt   Primary Care Physician: None     SUMMARY:   LEONARD FEIGEL is a 46 y.o. gentleman w/ PMH most significant for ESRD on HD, HTN, h/o C diff colitis who was admitted on 06/16/2016 for abd pain.  Current medical issues leading to Palliative Medicine involvement include: pain mgmt     PALLIATIVE DIAGNOSES:   1. Abd pain  2. Flank pain   3. Debility  4. Medical noncompliance     PLAN:   1. Visited w/ pt in his rm.  Current HD session was just started.  Dialysis RN at bedside.  No family at bedside  2. Discussed w/ pt about weaning opioids given that the plan is for pt to be discharged w/o any opioid regimen.  I presented option of decreasing IV Dilaudid prn dose amt and leave at q4h prn; another option of changing Dilaudid 1 mg IV q4h prn to Dilaudid 1 mg IV q6h prn.  Pt wished for the latter but insisted that he is able to get the next dose at 1715.    3. [facility] [place].  Report c/w what pt reports.   4. For reference, pt received 6 of 6 doses of Dilaudid 1 mg IV q4h prn severe pain.    Dilaudid 6 mg IV is 120 OME.   5. Change Dilaudid 1 mg IV q4h prn severe pain to Dilaudid 1 mg IV q6h prn severe pain  6. Change Percocet 5-325 mg tabs #1 PO q4h prn moderate pain to Percocet 5-325 mg tabs #1 PO q6h prn moderate pain  Oxycodone 5 mg is 7.5 OME.  7. Discussed w/ Dr. Sharlette Dense, primary medical team attending.  Time and input appreciated.   8. Discussed w/ Ailene Ravel, Advertising copywriter.  Time and input appreciated.   9. Communicated plan of care with: Palliative IDT     GOALS OF CARE / TREATMENT PREFERENCES:     GOALS OF CARE:   not discussed  Assumed to be for full, restorative treatment and all measures that support this    TREATMENT PREFERENCES:   Code Status: Full Code    Advance Care Planning:  Advance Care Planning  06/21/2016   Patient's Healthcare Decision Maker is: Named in scanned ACP document   Primary Decision Maker Name Marcus Schwandt   Primary Decision Maker Phone Number [phone]   Primary Decision Maker Relationship to Patient -   Secondary Decision Maker Name [name]   Secondary Decision Maker Phone Number [phone]   Secondary Decision Maker Relationship to Patient -   Confirm Advance Directive Yes, on file           Other Instructions: none at this time        Other:    As far as possible, the palliative care team has discussed with patient / health care proxy about goals of care / treatment preferences for patient.           HISTORY:     History obtained from: pt, chart, primary team    CHIEF COMPLAINT: abd pain    HPI/SUBJECTIVE:    The patient is:   '[x]'  Verbal and participatory  '[]'  Non-participatory due to:   Pt is a 46 y/o AAM w/ PMH noted above who presented to our  ED w/ abd pain and diarrhea.      Pt is apparently noncompliant w/ HD.  He has been in the hospital several times and has left AMA in the past.  Pt has had worsening abd pain.  Pt reported having C diff "11 times", 6+ BMs per day for the past 5 days.  Pt is not able to keep anything down PO.  Abd pain is sharp.  +SOB, BLE swelling.      Pt had reported last HD session 1 wk ago.     In our ED, imaging significant for anasarca, mod amt of ascites, mild pulm edema.   Pt was admitted for further w/u and mgmt.    Clinical Pain Assessment (nonverbal scale for severity on nonverbal patients):   Clinical Pain Assessment  Severity: 9         [++++ Clinical Pain Assessment++++]  [++++Pain Severity++++]: Pain: 9  [++++Pain Character++++]: sharp  [++++Pain Duration++++]: several days  [++++Pain Effect++++]: functional  [++++Pain Factors++++]: Dilaudid reduces pain from as high as 10 to 5-6  [++++Pain Frequency++++]: persistent but varies in intensity  [++++Pain Location++++]: mid abd and radiates to R flank and into back  [++++ Clinical Pain  Assessment++++]      Duration: for how long has pt been experiencing pain (e.g., 2 days, 1 month, years)  Frequency: how often pain is an issue (e.g., several times per day, once every few days, constant)     FUNCTIONAL ASSESSMENT:     Palliative Performance Scale (PPS):  PPS: 50       PSYCHOSOCIAL/SPIRITUAL SCREENING:     Palliative IDT has assessed this patient for cultural preferences / practices and a referral made as appropriate to needs (Cultural Services, Patient Advocacy, Ethics, etc.)    Advance Care Planning:  Advance Care Planning 06/21/2016   Patient's Healthcare Decision Maker is: Named in scanned ACP document   Primary Decision Maker Name Lymon Kidney   Primary Decision Maker Phone Number [phone]   Primary Decision Maker Relationship to Patient -   Secondary Decision Maker Name [name]   Secondary Decision Maker Phone Number [phone]   Secondary Decision Maker Relationship to Patient -   Confirm Advance Directive Yes, on file       Any spiritual / religious concerns:  '[]'  Yes /  '[x]'  No    Caregiver Burnout:  '[]'  Yes /  '[]'  No /  '[x]'  No Caregiver Present      Anticipatory grief assessment:   '[]'  Normal  / '[]'  Maladaptive       ESAS Anxiety: Anxiety: 0    ESAS Depression: Depression: 0        REVIEW OF SYSTEMS:     Positive and pertinent negative findings in ROS are noted above in HPI.  The following systems were '[x]'  reviewed / '[]'  unable to be reviewed as noted in HPI  Other findings are noted below.  Systems: constitutional, ears/nose/mouth/throat, respiratory, gastrointestinal, genitourinary, musculoskeletal, integumentary, neurologic, psychiatric, endocrine. Positive findings noted below.  Modified ESAS Completed by: provider   Fatigue: 0 Drowsiness: 0   Depression: 0 Pain: 9   Anxiety: 0 Nausea: 0   Anorexia: 0 Dyspnea: 0           Stool Occurrence(s): 1        PHYSICAL EXAM:     From RN flowsheet:  Wt Readings from Last 3 Encounters:   06/21/16 230 lb 9.6 oz (104.6 kg)   05/23/16 227 lb 1.2 oz  (103  kg)   05/21/16 270 lb (122.5 kg)     Blood pressure (!) 175/114, pulse 86, temperature 97.9 ??F (36.6 ??C), temperature source Oral, resp. rate 18, height '6\' 1"'  (1.854 m), weight 230 lb 9.6 oz (104.6 kg), SpO2 100 %.    Pain Scale 1: Numeric (0 - 10)  Pain Intensity 1: 9     Pain Location 1: Abdomen  Pain Orientation 1: Lower  Pain Description 1: Aching  Pain Intervention(s) 1: Medication (see MAR)  Last bowel movement, if known:     Gen: sitting up in bed, appears comfortable, in NAD  HEENT: NC/AT, wearing glasses  Respiratory: breathing not labored, symmetric  Neurologic: awake, alert, following commands, moving all extremities  Psychiatric: appropriate, no hallucinations  No delusions  No obvious signs of agitation or anxiety  Per Appelbaum criteria, pt demonstrates capacity regarding pain mgmt.  More specifically,   -pt demonstrates understanding of the decision/choice communicated and is able to indicate a decision/choice  -pt demonstrates understanding of relevant information.  Pt is able to paraphrase shared information.   -pt appreciates situation and its consequences.   -pt demonstrates the ability to reason about options including comparing options and consequences and provide reasons for selection of option       HISTORY:     Active Problems:    ESRD (end stage renal disease) on dialysis ([place]) (03/04/2016)      Volume overload (06/16/2016)      Past Medical History:   Diagnosis Date   ??? Adverse effect of anesthesia     sleep apnea uses oxygen at night    ??? Ascites    ??? Asthma    ??? C. difficile colitis    ??? CKD (chronic kidney disease) stage V requiring chronic dialysis ([place])    ??? HTN (hypertension)    ??? SBP (spontaneous bacterial peritonitis) [facility])       Past Surgical History:   Procedure Laterality Date   ??? COLONOSCOPY N/A 03/04/2016    COLONOSCOPY performed by Macie Burows, MD at [facility]   ??? HX HERNIA REPAIR     ??? SIGMOIDOSCOPY,DIAGNOSTIC  03/04/2016           Family History   Problem Relation  Age of Onset   ??? Hypertension Mother       History reviewed, no pertinent family history.  Social History   Substance Use Topics   ??? Smoking status: Heavy Tobacco Smoker     Packs/day: 0.50     Years: 10.00   ??? Smokeless tobacco: Never Used   ??? Alcohol use No     Allergies   Allergen Reactions   ??? Lisinopril Shortness of Breath   ??? Morphine Shortness of Breath   ??? Toradol [Ketorolac] Shortness of Breath      Current Facility-Administered Medications   Medication Dose Route Frequency   ??? HYDROmorphone (DILAUDID) injection 1 mg  1 mg IntraVENous Q6H PRN   ??? oxyCODONE-acetaminophen (PERCOCET) 5-325 mg per tablet 1 Tab  1 Tab Oral Q6H PRN   ??? carvedilol (COREG) tablet 12.5 mg  12.5 mg Oral BID WITH MEALS   ??? amLODIPine (NORVASC) tablet 10 mg  10 mg Oral DAILY   ??? pantoprazole (PROTONIX) tablet 40 mg  40 mg Oral ACB   ??? hydrALAZINE (APRESOLINE) 20 mg/mL injection 10 mg  10 mg IntraVENous Q2H PRN   ??? lactobac ac& pc-s.therm-b.anim (FLORA Q/RISAQUAD)  1 Cap Oral DAILY   ??? vancomycin 50 mg/mL oral solution (  compounded) 125 mg  125 mg Oral Q6H   ??? heparin (porcine) injection 5,000 Units  5,000 Units SubCUTAneous Q12H   ??? nitroglycerin (NITROBID) 2 % ointment 1 Inch  1 Inch Topical BID   ??? albuterol (PROVENTIL HFA, VENTOLIN HFA, PROAIR HFA) inhaler 2 Puff  2 Puff Inhalation Q4H PRN   ??? hydrALAZINE (APRESOLINE) tablet 100 mg  100 mg Oral TID   ??? sevelamer carbonate (RENVELA) tab 800 mg  800 mg Oral TID WITH MEALS   ??? sodium chloride (NS) flush 5-10 mL  5-10 mL IntraVENous Q8H   ??? sodium chloride (NS) flush 5-10 mL  5-10 mL IntraVENous PRN   ??? acetaminophen (TYLENOL) tablet 650 mg  650 mg Oral Q4H PRN   ??? naloxone (NARCAN) injection 0.4 mg  0.4 mg IntraVENous PRN   ??? diphenhydrAMINE (BENADRYL) capsule 25 mg  25 mg Oral Q4H PRN   ??? ondansetron (ZOFRAN) injection 4 mg  4 mg IntraVENous Q4H PRN          LAB AND IMAGING FINDINGS:     Lab Results   Component Value Date/Time    WBC 4.6 06/19/2016 02:39 PM    HGB 7.6 (L) 06/19/2016  02:39 PM    PLATELET 110 (L) 06/19/2016 02:39 PM     Lab Results   Component Value Date/Time    Sodium 139 06/19/2016 02:39 PM    Potassium 5.9 (H) 06/19/2016 02:39 PM    Chloride 106 06/19/2016 02:39 PM    CO2 23 06/19/2016 02:39 PM    BUN 66 (H) 06/19/2016 02:39 PM    Creatinine 11.80 (H) 06/19/2016 02:39 PM    Calcium 6.1 (LL) 06/19/2016 02:39 PM    Magnesium 2.2 06/19/2016 02:39 PM    Phosphorus 6.0 (H) 06/19/2016 02:39 PM      Lab Results   Component Value Date/Time    AST (SGOT) 23 06/19/2016 02:39 PM    Alk. phosphatase 275 (H) 06/19/2016 02:39 PM    Protein, total 5.8 (L) 06/19/2016 02:39 PM    Albumin 2.7 (L) 06/19/2016 02:39 PM    Globulin 3.1 06/19/2016 02:39 PM     Lab Results   Component Value Date/Time    INR 1.2 (H) 05/21/2016 05:13 PM    Prothrombin time 11.5 (H) 05/21/2016 05:13 PM    aPTT 34.5 (H) 05/22/2016 09:20 AM      Lab Results   Component Value Date/Time    Iron 116 05/22/2016 03:18 AM    TIBC 217 (L) 05/22/2016 03:18 AM    Iron % saturation 53 (H) 05/22/2016 03:18 AM    Ferritin 1676 (H) 05/22/2016 03:18 AM      No results found for: PH, PCO2, PO2  No components found for: [facility]   Lab Results   Component Value Date/Time    CK 579 (H) 05/21/2016 05:08 PM    CK - MB 17.4 (H) 05/21/2016 05:08 PM                Total time: 55 min  Counseling / coordination time, spent as noted above: 35 min  > 50% counseling / coordination?: yes    Prolonged service was provided for  '[]' 30 min   '[]' 75 min in face to face time in the presence of the patient, spent as noted above.  Time Start:   Time End:   Note: this can only be billed with [phone] (initial) or [phone] (follow up).  If multiple start / stop times, list each separately.

## 2016-06-21 NOTE — ACP (Advance Care Planning) (Signed)
Advance Care Planning:  Advance Care Planning 06/21/2016   Patient's Healthcare Decision Maker is: Named in scanned ACP document   Primary Decision Maker Name David [name]   Primary Decision Maker Phone Number [phone]   Primary Decision Maker Relationship to Patient -   Secondary Decision Maker Name David [name]   Secondary Decision Maker Phone Number [phone]   Secondary Decision Maker Relationship to Patient -   Confirm Advance Directive Yes, on file   ??

## 2016-06-21 NOTE — Discharge Summary (Signed)
Discharge Summary       PATIENT ID: David [name]  MRN: [id number]   DATE OF BIRTH: Aug 04, 1970    DATE OF ADMISSION: 06/16/2016  7:57 PM    DATE OF DISCHARGE: 06/21/2016   PRIMARY CARE PROVIDER: None     ATTENDING PHYSICIAN: Cherlynn Kaiser, MD  DISCHARGING PROVIDER: Cherlynn Kaiser, MD    To contact this individual call [phone] and ask the operator to page.  If unavailable ask to be transferred the Adult Hospitalist Department.    CONSULTATIONS: IP CONSULT TO NEPHROLOGY  IP CONSULT TO INFECTIOUS DISEASES  IP CONSULT TO PALLIATIVE CARE - PROVIDER    PROCEDURES/SURGERIES: * No surgery found *    ADMITTING DIAGNOSES & HOSPITAL COURSE:   Pt was pulsed vancomycin and place on taper per ID recommendations. Further recommendations were that he will need fecal transplant. Referral to [facility] placed as with previous admission.   Nephrology consulted for his ESRD requiring HD    His abdominal pain was difficult to deal with. He was placed on Dilaudid q4hr and received every dose possible. Palliative Care was consulted and spoke with Pt regarding weaning. Pain in general could be due to secondary hyperparathyroidism given his ESRD (in combination with C. diff colitis) however Pt signed out AMA prior to workup. Corrected Ca2+ 7.14. Pt did have normocytic anemia but did not require transfusion.    ??Pt signed out AMA after receiving hemodialysis.        DISCHARGE DIAGNOSES / PLAN:      1. Recurrent C. diff colitis  2. Abdominal pain  3. Hypocalcemia  4. Normocytic anemia  5. End-stage renal disease-hemodialysis  6. Hypertension  7. Asthma, not in acute exacerbation  8. Obesity (BMI 30.42)       PENDING TEST RESULTS:   At the time of discharge the following test results are still pending: none    FOLLOW UP APPOINTMENTS:    Follow-up Information     None           ADDITIONAL CARE RECOMMENDATIONS: none    DIET: Cardiac Diet and Renal Diet    ACTIVITY: Activity as tolerated    WOUND CARE: none    EQUIPMENT needed:  none      DISCHARGE MEDICATIONS:  Discharge Medication List as of 06/21/2016  7:13 PM            NOTIFY YOUR PHYSICIAN FOR ANY OF THE FOLLOWING:   Fever over 101 degrees for 24 hours.   Chest pain, shortness of breath, fever, chills, nausea, vomiting, diarrhea, change in mentation, falling, weakness, bleeding. Severe pain or pain not relieved by medications.  Or, any other signs or symptoms that you may have questions about.    DISPOSITION:    Home With:   OT  PT  HH  RN       Long term SNF/Inpatient Rehab    Independent/assisted living    Hospice   x Other: AMA       PATIENT CONDITION AT DISCHARGE:     Functional status    Poor     Deconditioned    x Independent      Cognition   x  Lucid     Forgetful     Dementia      Catheters/lines (plus indication)    Foley     PICC     PEG    x None      Code status   x  Full  code     DNR      PHYSICAL EXAMINATION AT DISCHARGE:   Refer to Progress Note 06/21/2016      CHRONIC MEDICAL DIAGNOSES:  Problem List as of 06/21/2016  Date Reviewed: 06/20/2016          Codes Class Noted - Resolved    Volume overload ICD-10-CM: E87.70  ICD-9-CM: 276.69  06/16/2016 - Present        NSTEMI (non-ST elevated myocardial infarction) (HCC) ICD-10-CM: I21.4  ICD-9-CM: 410.70  05/22/2016 - Present        C. difficile colitis ICD-10-CM: A04.72  ICD-9-CM: 008.45  05/15/2016 - Present        Hyperkalemia ICD-10-CM: E87.5  ICD-9-CM: 276.7  05/15/2016 - Present        H/O noncompliance with medical treatment, presenting hazards to health ICD-10-CM: Z91.19  ICD-9-CM: V15.81  03/09/2016 - Present        Diarrhea of infectious origin ICD-10-CM: A09  ICD-9-CM: 009.3  03/09/2016 - Present        Left arm swelling ICD-10-CM: M79.89  ICD-9-CM: 729.81  03/09/2016 - Present        Pain in left upper arm ICD-10-CM: Z61.09679.622  ICD-9-CM: 729.5  03/09/2016 - Present        Ascites ICD-10-CM: R18.8  ICD-9-CM: 789.59  03/04/2016 - Present        ESRD (end stage renal disease) on dialysis [facility](HCC) ICD-10-CM: N18.6, Z99.2  ICD-9-CM:  585.6, V45.11  03/04/2016 - Present        Recurrent Clostridium difficile diarrhea ICD-10-CM: A04.71  ICD-9-CM: 008.45  02/29/2016 - Present        Peritonitis (HCC) ICD-10-CM: K65.9  ICD-9-CM: 567.9  11/21/2015 - Present        Gastroenteritis ICD-10-CM: K52.9  ICD-9-CM: 558.9  10/14/2015 - Present              Greater than 30 minutes were spent with the patient on counseling and coordination of care    Signed:   Frazier Richardsnan W Bomar IV, MD  06/21/2016  7:24 PM

## 2016-06-21 NOTE — Procedures (Signed)
 [facility] [place]  [facility]  [phone]    Vitals   Pre   Post   Assessment   Pre   Post     Temp  Temp: 97.9 F (36.6 C) (06/21/16 1715) 98.4 LOC  A&Ox4 same   HR   Pulse (Heart Rate): 86 (06/21/16 1715) 86 Lungs   Coarse, expiratory wheezing same   B/P   BP: (!) 175/114 (06/21/16 1715) 182/101 Cardiac   HRR- S1, S2 present same    Resp   Resp Rate: 18 (06/21/16 1715) 18 Skin   CDI same    Pain level  10 8 Edema  2+ BLE     same   Orders:    Duration:   Start:   17:14 End:   18:47 Total:   1.5 h   Dialyzer:   Dialyzer/Set Up Inspection: Revaclear (06/21/16 1715)   K Bath:   Dialysate K (mEq/L): 2 (06/21/16 1715)   Ca Bath:   Dialysate CA (mEq/L): 2.5 (06/21/16 1715)   Na/Bicarb:   Dialysate NA (mEq/L): 140 (06/21/16 1715)   Target Fluid Removal:   Goal/Amount of Fluid to Remove (mL): 2500 mL (06/21/16 1715)   Access     Type & Location:   LIJ CVC: Dressing CDI. No s/s of infection. Both lumens aspirate & flush well. Running well at BFR 425.   Labs     Obtained/Reviewed   Critical Results Called   Date when labs were drawn-  Hgb-    HGB   Date Value Ref Range Status   06/19/2016 7.6 (L) 12.1 - 17.0 g/dL Final     K-    Potassium   Date Value Ref Range Status   06/19/2016 5.9 (H) 3.5 - 5.1 mmol/L Final     Ca-   Calcium   Date Value Ref Range Status   06/19/2016 6.1 (LL) 8.5 - 10.1 MG/DL Final     Comment:     RESULTS VERIFIED, PHONED TO AND READ BACK BY  LEWIS RN @ 1541 VDD       Bun-   BUN   Date Value Ref Range Status   06/19/2016 66 (H) 6 - 20 MG/DL Final     Creat-   Creatinine   Date Value Ref Range Status   06/19/2016 11.80 (H) 0.70 - 1.30 MG/DL Final        Medications/ Blood Products Given     Name   Dose   Route and Time     none                Blood Volume Processed (BVP):    34.8 L Net Fluid   Removed:  784 ml   Comments   Time Out Done: 17:13  Primary Nurse Rpt PreBETHA Nation [place]  RN  Primary Nurse Rpt PostBETHA [place]  RN  Pt Education: procedural, importance of taking BP  meds  Care Plan: ongoing   Tx Summary:   SBAR received from Primary RN Enjoli [name] . Verified consent signed & on file. Arrived to patient room at 16:15 patient not present. Staff stated that he had walked off unit and they would attempt to retrieve him. Set up machine and tested water. Patient returned at 16:45. Patient BP 175/114 refusing to take BP medication. Reassessed BP now 187/105 Notified MD Abou-Assi at 17:07 who gave permission to initiate HD.  17:14 Both lumens of permcath disinfected with Alcohol per policy.  Each lumen aspirated for blood return and flushed with  Normal Saline per policy. Labs drawn per request/ order. Dialysis Tx initiated. Patient requesting pain medication.  17:24- 1 mg of dilaudid IV given by primary RN.   17:30- Patient BP 182/112, he states that taking BP meds before HD will cause hypotension  18:00- Pt resting quietly. No apparent distress.  18:19- Notified MD Annelle Poling that patient's Hgb is 7.8. No new orders received.  18:30- Pt resting quietly. No apparent distress. Obtained patient BP with manual cuff.  18:45- Patient stated he wanted to terminate tx. Notified primary RN. Enjoli and I encouraged him to complete his tx. Patient insisted to terminate tx and stated he wanted to leave AMA and go home to [place]. Notified MD Annelle Poling.  18:47- Tx ended. Rinsed back all possible blood.VSS. Central line catheter flushed with normal saline per policy. Ports disinfected with Alcohol per policy and lines disconnected and capped using aseptic technique. SBAR given to Primary, RN Enjoli [name] .  Admiting Diagnosis: ESRD/ C-diff  Pt's previous clinic- TBD  Consent signed - Informed Consent Verified: Yes (06/21/16 1715)  [facility] Consent - on file  Hepatitis Status- neg, 10/15/15,   Machine #- Machine Number: [id number] (06/21/16 1715)  Telemetry status- n/a  Pre-dialysis wt.- Pre-Dialysis Weight: 101 kg (222 lb 10.6 oz) (06/17/16 1855)

## 2016-06-22 LAB — CULTURE, BODY FLUID W GRAM STAIN
Culture result:: NO GROWTH
GRAM STAIN: NONE SEEN
GRAM STAIN: NONE SEEN

## 2016-06-23 LAB — HEP B SURFACE AG
Hep B surface Ag Interp.: NEGATIVE
Hepatitis B surface Ag: <0.1 Index

## 2016-06-24 ENCOUNTER — Emergency Department: Payer: Medicare Other

## 2016-06-24 ENCOUNTER — Emergency Department
Admission: EM | Admit: 2016-06-24 | Discharge: 2016-06-24 | Disposition: A | Discharge status: Other Acute Inpatient Hospital | Payer: Medicare Other | Attending: Emergency Medicine | Admitting: Emergency Medicine

## 2016-06-24 ENCOUNTER — Observation Stay
Admission: AD | Admit: 2016-06-24 | Discharge: 2016-06-24 | Discharge status: Against Medical Advice | Payer: Medicare Other | Source: Other Acute Inpatient Hospital | Attending: Internal Medicine | Admitting: Internal Medicine

## 2016-06-24 ENCOUNTER — Observation Stay: Payer: Medicare Other | Admitting: Internal Medicine

## 2016-06-24 DIAGNOSIS — R112 Nausea with vomiting, unspecified: Secondary | ICD-10-CM | POA: Insufficient documentation

## 2016-06-24 DIAGNOSIS — F17210 Nicotine dependence, cigarettes, uncomplicated: Secondary | ICD-10-CM | POA: Insufficient documentation

## 2016-06-24 DIAGNOSIS — R0602 Shortness of breath: Secondary | ICD-10-CM | POA: Insufficient documentation

## 2016-06-24 DIAGNOSIS — I252 Old myocardial infarction: Secondary | ICD-10-CM | POA: Insufficient documentation

## 2016-06-24 DIAGNOSIS — E8779 Other fluid overload: Secondary | ICD-10-CM | POA: Insufficient documentation

## 2016-06-24 DIAGNOSIS — R197 Diarrhea, unspecified: Secondary | ICD-10-CM | POA: Insufficient documentation

## 2016-06-24 DIAGNOSIS — N186 End stage renal disease: Secondary | ICD-10-CM | POA: Insufficient documentation

## 2016-06-24 DIAGNOSIS — K529 Noninfective gastroenteritis and colitis, unspecified: Secondary | ICD-10-CM | POA: Insufficient documentation

## 2016-06-24 DIAGNOSIS — Z9115 Patient's noncompliance with renal dialysis: Secondary | ICD-10-CM | POA: Insufficient documentation

## 2016-06-24 DIAGNOSIS — Z8249 Family history of ischemic heart disease and other diseases of the circulatory system: Secondary | ICD-10-CM | POA: Insufficient documentation

## 2016-06-24 DIAGNOSIS — I132 Hypertensive heart and chronic kidney disease with heart failure and with stage 5 chronic kidney disease, or end stage renal disease: Secondary | ICD-10-CM | POA: Insufficient documentation

## 2016-06-24 DIAGNOSIS — R109 Unspecified abdominal pain: Principal | ICD-10-CM | POA: Insufficient documentation

## 2016-06-24 DIAGNOSIS — J45909 Unspecified asthma, uncomplicated: Secondary | ICD-10-CM | POA: Insufficient documentation

## 2016-06-24 DIAGNOSIS — R1084 Generalized abdominal pain: Secondary | ICD-10-CM | POA: Insufficient documentation

## 2016-06-24 DIAGNOSIS — Z992 Dependence on renal dialysis: Secondary | ICD-10-CM | POA: Insufficient documentation

## 2016-06-24 DIAGNOSIS — R509 Fever, unspecified: Secondary | ICD-10-CM | POA: Insufficient documentation

## 2016-06-24 DIAGNOSIS — Z885 Allergy status to narcotic agent status: Secondary | ICD-10-CM | POA: Insufficient documentation

## 2016-06-24 DIAGNOSIS — Z91158 Patient's noncompliance with renal dialysis for other reason: Secondary | ICD-10-CM

## 2016-06-24 DIAGNOSIS — I10 Essential (primary) hypertension: Secondary | ICD-10-CM | POA: Insufficient documentation

## 2016-06-24 DIAGNOSIS — D638 Anemia in other chronic diseases classified elsewhere: Secondary | ICD-10-CM | POA: Insufficient documentation

## 2016-06-24 DIAGNOSIS — I5022 Chronic systolic (congestive) heart failure: Secondary | ICD-10-CM | POA: Insufficient documentation

## 2016-06-24 LAB — COMPREHENSIVE METABOLIC PANEL
ALT: 14 U/L (ref 0–55)
AST (SGOT): 19 U/L (ref 10–42)
Albumin/Globulin Ratio: 0.96 Ratio (ref 0.70–1.50)
Albumin: 2.9 gm/dL — ABNORMAL LOW (ref 3.5–5.0)
Alkaline Phosphatase: 256 U/L — ABNORMAL HIGH (ref 40–145)
Anion Gap: 20.4 mMol/L — ABNORMAL HIGH (ref 7.0–18.0)
BUN / Creatinine Ratio: 6.1 Ratio — ABNORMAL LOW (ref 10.0–30.0)
BUN: 66 mg/dL — ABNORMAL HIGH (ref 7–22)
Bilirubin, Total: 0.7 mg/dL (ref 0.1–1.2)
CO2: 19.3 mMol/L — ABNORMAL LOW (ref 20.0–30.0)
Calcium: 7.3 mg/dL — ABNORMAL LOW (ref 8.5–10.5)
Chloride: 105 mMol/L (ref 98–110)
Creatinine: 10.74 mg/dL — ABNORMAL HIGH (ref 0.80–1.30)
EGFR: 6 mL/min/{1.73_m2} — ABNORMAL LOW (ref 60–150)
Globulin: 3.1 gm/dL (ref 2.0–4.0)
Glucose: 103 mg/dL — ABNORMAL HIGH (ref 70–99)
Osmolality Calc: 298 mOsm/kg (ref 275–300)
Potassium: 5.2 mMol/L (ref 3.5–5.3)
Protein, Total: 6 gm/dL (ref 6.0–8.3)
Sodium: 140 mMol/L (ref 136–147)

## 2016-06-24 LAB — CBC AND DIFFERENTIAL
Basophils %: 1.3 % (ref 0.0–3.0)
Basophils Absolute: 0.1 10*3/uL (ref 0.0–0.3)
Eosinophils %: 6.1 % (ref 0.0–7.0)
Eosinophils Absolute: 0.4 10*3/uL (ref 0.0–0.8)
Hematocrit: 24.4 % — ABNORMAL LOW (ref 39.0–52.5)
Hemoglobin: 8.3 gm/dL — ABNORMAL LOW (ref 13.0–17.5)
Lymphocytes Absolute: 0.6 10*3/uL (ref 0.6–5.1)
Lymphocytes: 9.5 % — ABNORMAL LOW (ref 15.0–46.0)
MCH: 31 pg (ref 28–35)
MCHC: 34 gm/dL (ref 31–36)
MCV: 92 fL (ref 80–100)
MPV: 6.2 fL (ref 6.0–10.0)
Monocytes Absolute: 0.4 10*3/uL (ref 0.1–1.7)
Monocytes: 6.2 % (ref 3.0–15.0)
Neutrophils %: 76.9 % (ref 42.0–78.0)
Neutrophils Absolute: 4.8 10*3/uL (ref 1.7–8.6)
PLT CT: 105 10*3/uL — ABNORMAL LOW (ref 130–440)
RBC: 2.67 10*6/uL — ABNORMAL LOW (ref 4.00–5.70)
RDW: 16.1 % — ABNORMAL HIGH (ref 10.5–14.5)
WBC: 6.2 10*3/uL (ref 4.00–11.00)

## 2016-06-24 LAB — LIPASE: Lipase: 138 U/L — ABNORMAL HIGH (ref 8–78)

## 2016-06-24 MED ORDER — ALBUTEROL SULFATE HFA 108 (90 BASE) MCG/ACT IN AERS
2.0000 | INHALATION_SPRAY | Freq: Four times a day (QID) | RESPIRATORY_TRACT | Status: DC | PRN
Start: 2016-06-24 — End: 2016-06-24

## 2016-06-24 MED ORDER — CARVEDILOL 25 MG PO TABS
25.0000 mg | ORAL_TABLET | Freq: Two times a day (BID) | ORAL | Status: DC
Start: 2016-06-24 — End: 2016-06-24
  Filled 2016-06-24 (×2): qty 1

## 2016-06-24 MED ORDER — VH HYDROMORPHONE HCL PF 1 MG/ML CARPUJECT
0.5000 mg | Freq: Once | INTRAMUSCULAR | Status: AC
Start: 2016-06-24 — End: 2016-06-24
  Administered 2016-06-24: 0.5 mg via INTRAVENOUS

## 2016-06-24 MED ORDER — VH HYDROMORPHONE HCL 1 MG/ML (NARRATOR)
INTRAMUSCULAR | Status: AC
Start: 2016-06-24 — End: ?
  Filled 2016-06-24: qty 1

## 2016-06-24 MED ORDER — HYDRALAZINE HCL 20 MG/ML IJ SOLN
10.0000 mg | Freq: Four times a day (QID) | INTRAMUSCULAR | Status: DC | PRN
Start: 2016-06-24 — End: 2016-06-24
  Administered 2016-06-24: 10:00:00 10 mg via INTRAVENOUS
  Filled 2016-06-24: qty 1

## 2016-06-24 MED ORDER — FAMOTIDINE 20 MG PO TABS
20.0000 mg | ORAL_TABLET | Freq: Every day | ORAL | Status: DC
Start: 2016-06-24 — End: 2016-06-24
  Filled 2016-06-24: qty 1

## 2016-06-24 MED ORDER — VH HYDROMORPHONE HCL PF 1 MG/ML CARPUJECT
1.0000 mg | Freq: Once | INTRAMUSCULAR | Status: AC
Start: 2016-06-24 — End: 2016-06-24
  Administered 2016-06-24: 1 mg via INTRAVENOUS

## 2016-06-24 MED ORDER — HEPARIN SODIUM (PORCINE) PF 5000 UNIT/0.5ML IJ SOLN
5000.0000 [IU] | Freq: Three times a day (TID) | INTRAMUSCULAR | Status: DC
Start: 2016-06-24 — End: 2016-06-24
  Filled 2016-06-24 (×3): qty 0.5

## 2016-06-24 MED ORDER — ONDANSETRON HCL 4 MG/2ML IJ SOLN
INTRAMUSCULAR | Status: AC
Start: 2016-06-24 — End: ?
  Filled 2016-06-24: qty 2

## 2016-06-24 MED ORDER — SEVELAMER CARBONATE 800 MG PO TABS
800.0000 mg | ORAL_TABLET | Freq: Three times a day (TID) | ORAL | Status: DC
Start: 2016-06-24 — End: 2016-06-24
  Filled 2016-06-24 (×2): qty 1

## 2016-06-24 MED ORDER — ACETAMINOPHEN 325 MG PO TABS
650.0000 mg | ORAL_TABLET | ORAL | Status: DC | PRN
Start: 2016-06-24 — End: 2016-06-24

## 2016-06-24 MED ORDER — ALBUTEROL-IPRATROPIUM 100-20 MCG/ACT IN AERS
2.0000 | INHALATION_SPRAY | Freq: Four times a day (QID) | RESPIRATORY_TRACT | Status: DC
Start: 2016-06-24 — End: 2016-06-24

## 2016-06-24 MED ORDER — FUROSEMIDE 80 MG PO TABS
80.0000 mg | ORAL_TABLET | Freq: Every day | ORAL | Status: DC
Start: 2016-06-24 — End: 2016-06-24
  Filled 2016-06-24: qty 1

## 2016-06-24 MED ORDER — ONDANSETRON HCL 4 MG/2ML IJ SOLN
4.0000 mg | Freq: Once | INTRAMUSCULAR | Status: AC
Start: 2016-06-24 — End: 2016-06-24
  Administered 2016-06-24: 4 mg via INTRAVENOUS

## 2016-06-24 MED ORDER — HYDRALAZINE HCL 50 MG PO TABS
100.0000 mg | ORAL_TABLET | Freq: Three times a day (TID) | ORAL | Status: DC
Start: 2016-06-24 — End: 2016-06-24
  Filled 2016-06-24 (×3): qty 2

## 2016-06-24 MED ORDER — AMLODIPINE BESYLATE 5 MG PO TABS
10.0000 mg | ORAL_TABLET | Freq: Every day | ORAL | Status: DC
Start: 2016-06-24 — End: 2016-06-24
  Filled 2016-06-24: qty 2

## 2016-06-24 NOTE — H&P (Signed)
Patient: [name]  Date: 06/24/2016   DOB: June 09, 1970  Admission Date: 06/24/2016   MRN: [id number]  Attending:Shamyra Farias MD      CODE STATUS: full code    PRIMARY CARE MD: Christa See, MD    Chief complaint abdominal pain and diarrhea      HISTORY AND PHYSICAL     [name] is a 46 y.o. male with  History of chronic systolic congestive heart failure history of end-stage renal disease on hemodialysis history of medical noncompliance patient today presented to an outlying facility with abdominal pain and diarrhea and was transferred to [facility] for his dialysis needs.  Patient says he had nausea vomiting and diarrhea going on for about 4-5 days. He denies any recent antibiotic use patient also complained of significant abdominal pain denies any fever or chills. Patient presented to facility with the above symptoms.  She had a dry CT of the abdomen which showed findings consistent with fluid overload. Pertinent to this visit patient does have history of hemodialysis and has missed last 2 sitting  for dialysis.  Patient was initially started on symptomatic treatment and then transferred to [facility] for dialysis needs. At the time of my examination patient is sitting comfortably in bed complains of 10 out of 10 abdominal pain and is requesting IV pain medication.          Past Medical History:   Diagnosis Date   . Asthma without status asthmaticus    . 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 Clostridium difficile diarrhea    . Drug-seeking behavior    . Hypertension    . Kidney failure    . Myocardial infarction     2002       Past Surgical History:   Procedure Laterality Date   . AV FISTULA PLACEMENT     . HERNIA REPAIR     . PERITONEAL CATHETER INSERTION              Prior to Admission medications    Medication Sig Start Date End Date Taking? Authorizing Provider   acetaminophen (TYLENOL) 325 MG tablet Take 2  tablets (650 mg total) by mouth every 4 (four) hours as needed for Pain. 01/08/16   Clydie Braun, PA   albuterol (PROVENTIL HFA;VENTOLIN HFA) 108 (90 Base) MCG/ACT inhaler Inhale 2 puffs into the lungs every 4 (four) hours as needed for Wheezing or Shortness of Breath.    [provider]   albuterol (PROVENTIL) (2.5 MG/3ML) 0.083% nebulizer solution Take 2.5 mg by nebulization 2 (two) times daily as needed for Wheezing or Shortness of Breath.        [provider]   albuterol-ipratropium (COMBIVENT RESPIMAT) 20-100 MCG/ACT Aero Soln Inhale 2 puffs into the lungs every evening.        [provider]   amLODIPine (NORVASC) 10 MG tablet Take 10 mg by mouth daily.        [provider]   carvedilol (COREG) 25 MG tablet Take 25 mg by mouth 2 (two) times daily.    [provider]   furosemide (LASIX) 80 MG tablet Take 80 mg by mouth daily.    [provider]   hydrALAZINE (APRESOLINE) 100 MG tablet Take 100 mg by mouth 3 (three) times daily.    [provider]   pantoprazole (PROTONIX) 40 MG tablet Take 40 mg by mouth daily.    [provider]   sevelamer (  RENVELA) 800 MG tablet Take 800 mg by mouth 3 (three) times daily with meals.    [provider]       Allergies   Allergen Reactions   . Lisinopril Anaphylaxis   . Toradol [Ketorolac Tromethamine] Anaphylaxis   . Morphine Other (See Comments)     "jittery" per patient, "I don't like the way it makes me feel". Previously charted anaphylaxis, but patient said just "jumpy, jittery"   . Percocet [Oxycodone-Acetaminophen] Other (See Comments)     "jittery" per patient, "I don't like the way it makes me feel"       Family History   Problem Relation Age of Onset   . Hypertension Mother      Social History   Substance Use Topics   . Smoking status: Current Every Day Smoker     Packs/day: 0.25     Years: 10.00     Types: Cigarettes     Last attempt to quit: 09/18/2015   . Smokeless  tobacco: Former Neurosurgeon      Comment: declines   . Alcohol use No       REVIEW OF SYSTEMS     All  systems were reviewed and are negative unless pertinent positive stated in  HPI.    CONSTITUTIONAL: No night sweats. No fatigue, malaise, lethargy. No fever or chills.   HEENT: Eyes: No visual changes. No eye pain. No eye discharge.   ENT: No runny nose. No epistaxis. No sinus pain. No sore throat. No odynophagia. No ear pain. No congestion.   BREASTS: No breast pain, soreness, lumps, or discharge.   RESPIRATORY: No cough. No wheeze. No hemoptysis. No shortness of breath.   CARDIOVASCULAR: No chest pains. No palpitations.   GENITOURINARY: No urgency. No frequency. No dysuria. No hematuria. No obstructive symptoms. No discharge. No pain. No significant abnormal bleeding.   MUSCULOSKELETAL: No musculoskeletal pain. No joint swelling. No arthritis.   NEUROLOGICAL:  No headache or neck pain. No syncope or seizure.   PSYCHIATRIC: No depression no psychosis.  SKIN: No rashes. No lesions. No wounds.   ENDOCRINE: No unexplained weight loss. No polydipsia. No polyuria. No polyphagia.   HEMATOLOGIC: No anemia. No purpura. No petechiae. No prolonged or excessive bleeding.   ALLERGIC AND IMMUNOLOGIC: No pruritus. No swelling      PHYSICAL EXAM     Vital Signs (most recent): BP (!) 193/113 Comment: RN notified  Pulse 86   Temp 97.7 F (36.5 C) (Oral)   Resp 18   SpO2 100%     Wt Readings from Last 4 Encounters:   06/24/16 105.7 kg (233 lb)   05/20/16 99.5 kg (219 lb 5.7 oz)   05/04/16 106.3 kg (234 lb 6.4 oz)   02/23/16 97.5 kg (215 lb)       1) General Appearance:  In no apparent acute distress      2) HEENT: Head is atraumatic and normocephalic.   Eyes: Pink conjunctiva, anicteric sclera. Pupils are equally reactive to light and  accommodation. Extraocular muscles are intact.  ENT:  Patient has intact external auditory canal. No abnormal lesions or bleeding from  nose. Oral mucosa moist with no pharyngeal congestion, erythema  or swelling.    3) Neck: Supple, with full range of motion without any discomfort. Trachea is central,   no JVD noted, no carotid bruit, no cervical masses palpable.    4) Chest: Clear to auscultation bilaterally, no adventitial sounds heard.    5) CVS:  S1,  S2 normal intensity and regular rhythm. No murmurs, rubs or gallops  appreciated.     6) Abdomen: Soft, Tender no guarding or rigidity    7) Extremities: 2+ pulses without edema, cyanosis or clubbing.      8) Skin: Warm with normal skin turgor, no lesions seen.    9) Lymphatics: No lymphadenopathy in axillary, cervial and inguinal area.     10) Neurological: Alert and oriented x 4. Cranial nerves II-XII intact. No gross focal  neurological deficits noted.    11) Psychiatric: Mood and affect are appropriate.     LABS & IMAGING     Recent Results (from the past 24 hour(s))   CMP    Collection Time: 06/24/16  1:12 AM   Result Value Ref Range    Sodium 140 136 - 147 mMol/L    Potassium 5.2 3.5 - 5.3 mMol/L    Chloride 105 98 - 110 mMol/L    CO2 19.3 (L) 20.0 - 30.0 mMol/L    Calcium 7.3 (L) 8.5 - 10.5 mg/dL    Glucose 034 (H) 70 - 99 mg/dL    Creatinine 74.25 (H) 0.80 - 1.30 mg/dL    BUN 66 (H) 7 - 22 mg/dL    Protein, Total 6.0 6.0 - 8.3 gm/dL    Albumin 2.9 (L) 3.5 - 5.0 gm/dL    Alkaline Phosphatase 256 (H) 40 - 145 U/L    ALT 14 0 - 55 U/L    AST (SGOT) 19 10 - 42 U/L    Bilirubin, Total 0.7 0.1 - 1.2 mg/dL    Albumin/Globulin Ratio 0.96 0.70 - 1.50 Ratio    Anion Gap 20.4 (H) 7.0 - 18.0 mMol/L    BUN/Creatinine Ratio 6.1 (L) 10.0 - 30.0 Ratio    EGFR 6 (L) 60 - 150 mL/min/1.15m2    Osmolality Calc 298 275 - 300 mOsm/kg    Globulin 3.1 2.0 - 4.0 gm/dL   CBC    Collection Time: 06/24/16  1:12 AM   Result Value Ref Range    WBC 6.2 4.00 - 11.00 K/cmm    RBC 2.67 (L) 4.00 - 5.70 M/cmm    Hemoglobin 8.3 (L) 13.0 - 17.5 gm/dL    Hematocrit 95.6 (L) 39.0 - 52.5 %    MCV 92 80 - 100 fL    MCH 31 28 - 35 pg    MCHC 34 31 - 36 gm/dL    RDW 38.7 (H) 56.4 - 14.5 %    PLT  CT 105 (L) 130 - 440 K/cmm    MPV 6.2 6.0 - 10.0 fL    NEUTROPHIL % 76.9 42.0 - 78.0 %    Lymphocytes 9.5 (L) 15.0 - 46.0 %    Monocytes 6.2 3.0 - 15.0 %    Eosinophils % 6.1 0.0 - 7.0 %    Basophils % 1.3 0.0 - 3.0 %    Neutrophils Absolute 4.8 1.7 - 8.6 K/cmm    Lymphocytes Absolute 0.6 0.6 - 5.1 K/cmm    Monocytes Absolute 0.4 0.1 - 1.7 K/cmm    Eosinophils Absolute 0.4 0.0 - 0.8 K/cmm    BASO Absolute 0.1 0.0 - 0.3 K/cmm   Lipase    Collection Time: 06/24/16  1:12 AM   Result Value Ref Range    Lipase 138 (H) 8 - 78 U/L   ECG 12 lead (Stat Q8H X 2)    Collection Time: 06/24/16  1:25 AM   Result Value Ref Range  Patient Age 26 years    Patient DOB 08/21/1970     Patient Height      Patient Weight      Interpretation Text       Sinus rhythm  Probable left atrial enlargement  Low voltage, extremity leads  Borderline prolonged QT interval  Compared to ECG 05/20/2016 09:54:42  Low QRS voltage now present  Sinus tachycardia no longer present  Right ventricular hypertrophy no longer present  T-wave abnormality no longer present      Physician Interpreter      Ventricular Rate 97 //min    QRS Duration 108 ms    P-R Interval 185 ms    Q-T Interval 385 ms    Q-T Interval(Corrected) 489 ms    P Wave Axis 65 deg    QRS Axis 22 deg    T Axis 73 years           IMAGING:  No orders to display           Markers:        EMERGENCY DEPARTMENT COURSE:  Orders Placed This Encounter   Procedures   . Place  for Observation Services   . Bed Request       ASSESSMENT & PLAN         - Abdominal pain diarrhea CT scan unremarkable no recent antibiotic use admitted for observation mild likely is elevated secondary to nausea vomiting do not believe pancreatitis. We will treat symptomatically.   Send  stool for C. Difficile. 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    - Anemia of chronic disease follow H&H. Patient is in-house    - Chronic systolic  congestive heart failure euvolemic on beta blockers    - History of drug-seeking behavior patient informed IV narcotics would not be dispensed.    - Hypertension home dose of Norvasc Coreg to be initiated    - DVT prophylaxis    - Admitted as an observation            Health Care Proxy- patient did not wish to name anyone    Signed,  Stann Ore, MD - I can be reached at [phone]  06/24/2016 3:04 PM    CC: PCP and all specialists listed above    NOTE: This record is partially electronically generated (including the use of voice recognition technology) and may contain additions and omissions unintended by user.

## 2016-06-24 NOTE — ED Notes (Signed)
The patient is assigned to room 334. The # for report is [phone].

## 2016-06-24 NOTE — ED Notes (Signed)
Amy from [facility] stated that Dr. Barnet Pall accepted the patient. We will get a call with a bed assignment

## 2016-06-24 NOTE — ED Notes (Signed)
Pt requesting additional beverage. Pt educated on potential for fluid overload, secondary to missing HD x 3 tx's. Pt agitated stating "I'm not in fucking overload". MD aware. Pt allowed to have approx. 6oz ice chips prior to transport

## 2016-06-24 NOTE — ED Notes (Signed)
Pt states that he has fluid build up in abd and his PCP "does not know why" pt states that he has fluid taken off abd every week and last time was Thursday and had 8 liters remove from abd.

## 2016-06-24 NOTE — ED Notes (Signed)
Pt back from CT. PT states that pain went down to 7/10 after dilaudid but went back up to 10/10 after moving for CT. Pt requested something to drink or ice chips. MD states pt can have ice chips. Pt given small cup of ice chips. Pt resting in bed quietly. Pt denies needs at this time. Call bell in reach.

## 2016-06-24 NOTE — ED Provider Notes (Signed)
Physician/Midlevel provider first contact with patient: 06/24/16 0055         [facility] EMERGENCY DEPARTMENT History and Physical Exam      Patient Name: [name], [name]  Encounter Date:  06/24/2016  Attending Physician: Justice Britain, MD  PCP: Christa See, MD  Patient DOB:  1971/03/15  MRN:  [id number]  Room:  E8/ED8-A      History of Presenting Illness     Chief complaint: Abdominal Pain; Emesis; and Diarrhea    HPI/ROS is limited by: none  HPI/ROS given by: patient    Location: geeneralized  Duration: 3 days  Severity: moderate    [name] is a 46 y.o. male who presents with diarrhea, vomiting, abdominal pain for several days.  He reports he thinks he has recurrent C diff.  He states he was treated for this last fall and then last month having been released from [facility] several weeks ago.  He also states he has recently had fluid withdrawn from abdomen.  He states he has missed his last 3 HD appointments.  He feels slightly SOB.  He states he had a fever yesterday      Review of Systems     Review of Systems   Constitutional: Positive for fever.   Respiratory: Positive for shortness of breath.    Gastrointestinal: Positive for abdominal pain, diarrhea, nausea and vomiting.   All other systems reviewed and are negative.      Allergies     Pt is allergic to lisinopril; toradol [ketorolac tromethamine]; morphine; and percocet [oxycodone-acetaminophen].    Medications     No current facility-administered medications for this encounter.     Current Outpatient Prescriptions:   .  acetaminophen (TYLENOL) 325 MG tablet, Take 2 tablets (650 mg total) by mouth every 4 (four) hours as needed for Pain., Disp: , Rfl:   .  albuterol (PROVENTIL HFA;VENTOLIN HFA) 108 (90 Base) MCG/ACT inhaler, Inhale 2 puffs into the lungs every 4 (four) hours as needed for Wheezing or Shortness of Breath., Disp: , Rfl:   .  albuterol-ipratropium (COMBIVENT RESPIMAT) 20-100 MCG/ACT Aero Soln, Inhale 2 puffs into the  lungs every evening.  , Disp: , Rfl:   .  amLODIPine (NORVASC) 10 MG tablet, Take 10 mg by mouth daily.  , Disp: , Rfl:   .  carvedilol (COREG) 25 MG tablet, Take 25 mg by mouth 2 (two) times daily., Disp: , Rfl:   .  furosemide (LASIX) 80 MG tablet, Take 80 mg by mouth daily., Disp: , Rfl:   .  hydrALAZINE (APRESOLINE) 100 MG tablet, Take 100 mg by mouth 3 (three) times daily., Disp: , Rfl:   .  pantoprazole (PROTONIX) 40 MG tablet, Take 40 mg by mouth daily., Disp: , Rfl:   .  sevelamer (RENVELA) 800 MG tablet, Take 800 mg by mouth 3 (three) times daily with meals., Disp: , Rfl:   .  albuterol (PROVENTIL) (2.5 MG/3ML) 0.083% nebulizer solution, Take 2.5 mg by nebulization 2 (two) times daily as needed for Wheezing or Shortness of Breath.  , Disp: , Rfl:      Past Medical History     Pt has a past medical history of Asthma without status asthmaticus; Cardiac LV ejection fraction 21-40%; Clostridium difficile diarrhea; Drug-seeking behavior; Hypertension; Kidney failure; and Myocardial infarction.    Past Surgical History     Pt has a past surgical history that includes AV fistula placement; Peritoneal catheter insertion; and Hernia repair.  Family History     The family history includes Hypertension in his mother.    Social History     Pt reports that he has been smoking Cigarettes.  He has a 2.50 pack-year smoking history. He has quit using smokeless tobacco. He reports that he does not drink alcohol or use drugs.    Physical Exam     Blood pressure (!) 157/105, pulse 95, temperature 98 F (36.7 C), temperature source Oral, resp. rate 17, height 1.854 m, weight 105.7 kg, SpO2 99 %.    Constitutional: Vital signs reviewed. Well appearing.  Head: Normocephalic, atraumatic  Eyes: Conjunctiva and sclera are normal.  No injection or discharge.  Ears, Nose, Throat:  Normal external examination of the nose and ears.    Neck: Normal range of motion. Trachea midline.  Respiratory/Chest: Clear to auscultation. No  respiratory distress.   Cardiovascular: Regular rate and rhythm.  Abdomen:  Diffuse TTP, no peritonitis  Back: no cva tenderness    Upper Extremity:  No edema. No cyanosis.  Lower Extremity:  No edema. No cyanosis.  Skin: Warm and dry. No rash.  Psychiatric:  Normal affect.  Normal insight  Neuro: alert and oriented    Orders Placed     Orders Placed This Encounter   Procedures   . C diff Toxin B Gene by DNA Amplification   . CT Abdomen Pelvis dry  ([place])   . CMP   . CBC   . Lipase   . Remove Contact Isolation and Discontinue Contact Special Isolation Status order if the C Diff test is negatve.  Call physician if no C. Diff test performed due to resolution of symptoms after 24 hours.   . Contact Special Isolation Status   . ECG 12 lead (Stat Q8H X 2)   . Saline lock IV       Diagnostic Results       The results of the diagnostic studies below have been reviewed by myself:    Labs  Results     Procedure Component Value Units Date/Time    Lipase [[id number]]  (Abnormal) Collected:  06/24/16 0112    Specimen:  Plasma Updated:  06/24/16 0142     Lipase 138 (H) U/L     CMP [[id number]]  (Abnormal) Collected:  06/24/16 0112    Specimen:  Plasma Updated:  06/24/16 0142     Sodium 140 mMol/L      Potassium 5.2 mMol/L      Chloride 105 mMol/L      CO2 19.3 (L) mMol/L      Calcium 7.3 (L) mg/dL      Glucose 308 (H) mg/dL      Creatinine 65.78 (H) mg/dL      BUN 66 (H) mg/dL      Protein, Total 6.0 gm/dL      Albumin 2.9 (L) gm/dL      Alkaline Phosphatase 256 (H) U/L      ALT 14 U/L      AST (SGOT) 19 U/L      Bilirubin, Total 0.7 mg/dL      Albumin/Globulin Ratio 0.96 Ratio      Anion Gap 20.4 (H) mMol/L      BUN/Creatinine Ratio 6.1 (L) Ratio      EGFR 6 (L) mL/min/1.27m2      Osmolality Calc 298 mOsm/kg      Globulin 3.1 gm/dL     CBC [[id number]]  (Abnormal) Collected:  06/24/16 0112    Specimen:  Blood  from Blood Updated:  06/24/16 0128     WBC 6.2 K/cmm      RBC 2.67 (L) M/cmm      Hemoglobin 8.3 (L) gm/dL      Hematocrit  16.1 (L) %      MCV 92 fL      MCH 31 pg      MCHC 34 gm/dL      RDW 09.6 (H) %      PLT CT 105 (L) K/cmm      MPV 6.2 fL      NEUTROPHIL % 76.9 %      Lymphocytes 9.5 (L) %      Monocytes 6.2 %      Eosinophils % 6.1 %      Basophils % 1.3 %      Neutrophils Absolute 4.8 K/cmm      Lymphocytes Absolute 0.6 K/cmm      Monocytes Absolute 0.4 K/cmm      Eosinophils Absolute 0.4 K/cmm      BASO Absolute 0.1 K/cmm           Radiologic Studies  Radiology Results (24 Hour)     Procedure Component Value Units Date/Time    CT Abdomen Pelvis dry  Larina Bras) [[id number]] Collected:  06/24/16 0158    Order Status:  Completed Updated:  06/24/16 0207    Narrative:       Clinical History:  Reason For Exam: vomiting, diarrhea, abd pain, h/o ESRD, ascites  Patient has had multiple episodes of vomiting and diarrhea with worsening diffuse abdominal pain over the past 24 hours.    Examination:  CT of the abdomen and pelvis without contrast. Multiplanar reconstructions obtained.    CT images were acquired utilizing Automated Exposure Control for dose reduction.     Comparison:  05/20/2016    Findings:  The exam is technically limited by lack of intravenous contrast.    Lower chest:  The lung bases are clear. New small bilateral pleural effusions. Small pericardial fluid stable. Heart size borderline. Central venous catheter tips in right atrium.    Abdomen:  Liver normal size, contour and density. No mass or biliary dilatation.    Gallbladder absent or decompressed.    Pancreas and Adrenal glands normal. Mild splenomegaly.    Kidneys atrophic. No hydronephrosis, mass or stones.    The stomach, duodenum and small bowel are normal in caliber without inflammatory changes.    Colon normal caliber and wall thickness without inflammation or mass. Scattered colonic diverticula.    Decrease in small moderate abdominal pelvic ascites. Postoperative changes ventral hernia repair. 3 cm heterogeneous hyperdense lesion deep to left rectus abdominis  muscle left lower quadrant, stable. Aorta atherosclerotic. IVC normal caliber.     Pelvis:  Solid organs and bladder unremarkable.  No mass, pathologic adenopathy or free fluid. No inflammatory change.    Bones and Soft Tissues:  No suspicious osseous lesions. Schmorl's node inferior endplate L1-2. 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    Anasarca.      Impression:       No acute abdominopelvic process.    Volume overload with small pleural, pericardial effusions and abdominal pelvic ascites.    ReadingStation:[id number]          EKG: Sinus rhythm; negative for ischemic changes. HR 97.      MDM / Critical Care     Blood pressure (!) 157/105, pulse 95, temperature 98 F (36.7 C), temperature source Oral, resp. rate 17,  height 1.854 m, weight 105.7 kg, SpO2 99 %.    DDX includes C diff, chronic abdominal pain, malingering, hyperkalemia, dialysis non-compliance, volume overload among others.    ED Course     Pt noted to have mild volume overload, reports missing last 3 HD appts.  Pt reports recurrent c diff but noted to have normal WBC count in ED.  Pt accepted to [facility] by Dr Barnet Pall    Procedures         Diagnosis / Disposition     Clinical Impression  1. Colitis    2. Generalized abdominal pain    3. Dialysis patient, noncompliant    4. Other hypervolemia        Disposition  ED Disposition     ED Disposition Condition Date/Time Comment    [facility] Transfer to [facility]  Thu Jun 24, 2016  2:26 AM           Prescriptions  New Prescriptions    No medications on file                  Justice Britain, MD  06/24/16 [phone]

## 2016-06-24 NOTE — ED Notes (Signed)
Pt resting in bed quietly. Pt denies needs at this time. Call bell in reach.

## 2016-06-24 NOTE — ED Notes (Signed)
Pt transported to [facility] via VMT

## 2016-06-24 NOTE — ED Notes (Signed)
Pt taken to CT.

## 2016-06-24 NOTE — ED Notes (Signed)
Pt states that he has missed 3 dialysis appointments due to diarrhea. States last dialysis was Wednesday a week ago.

## 2016-06-24 NOTE — ED Notes (Signed)
Pt resting in bed quietly watching TV. Pt denies needs at this time. Call bell in reach.

## 2016-06-24 NOTE — ED Notes (Signed)
The 0700 crew will transport the patient to [facility].

## 2016-06-24 NOTE — Progress Notes (Signed)
Call received from Dr. Lorenza Chick, patient to be seen by Dr. Thedore Mins. BP addressed with Dr. Lorenza Chick, stated she would place orders.

## 2016-06-24 NOTE — Progress Notes (Signed)
Pt arrived to room 334 via VMT stretcher. Upon arrival patient complaining of SOB. Pt does not appear to be in any distress. O2 sat 100% on RA. Per patient, patient wears 2-3 L NC at home as needed. Pt placed on 2L for comfort. BP elevated, awaiting callback from accepting physician. IV intact and tele placed on patient. Contact special isolation orders placed. Call made to accepting physicians phone, awaiting callback at this time.

## 2016-06-24 NOTE — ED Notes (Signed)
6oz ginger ale provided to patient, by nightshift RN, per MD ok

## 2016-06-24 NOTE — Progress Notes (Signed)
Pt asking to leave AMA. Dr. Thedore Mins notified, AMA papers signed by patient. IV and tele removed. Cab voucher provided for ptient to transport back to [place].

## 2016-06-24 NOTE — ED Notes (Signed)
Waiting on room assignment. I am to call VMT as soon as I get the room assignment

## 2016-06-25 NOTE — UM Notes (Signed)
[facility] Utilization Management Review Sheet    Facility :  [facility]    NAME: [name]  MR#: [id number]    CSN#: [id number]     DOB: 10-27-1970    ROOM: 334/334-A AGE: 46 y.o.    ADMIT DATE AND TIME: 06/24/2016  8:52 AM      PATIENT CLASS: Observation 06/24/2016 @ 0936    ATTENDING PHYSICIAN: No att. providers found  PAYOR:Payor: MEDICARE / Plan: MEDICARE PART A AND B / Product Type: Medicare /       AUTH #:     DIAGNOSIS:   ESRD (end stage renal disease) N18.6       HISTORY:   Past Medical History:   Diagnosis Date   . Asthma without status asthmaticus    . 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 Clostridium difficile diarrhea    . Drug-seeking behavior    . Hypertension    . Kidney failure    . Myocardial infarction     2002     "[name] is a 46 y.o. male with  History of chronic systolic congestive heart failure history of end-stage renal disease on hemodialysis history of medical noncompliance patient today presented to an outlying facility with abdominal pain and diarrhea and was transferred to [facility] for his dialysis needs.  Patient says he had nausea vomiting and diarrhea going on for about 4-5 days. He denies any recent antibiotic use patient also complained of significant abdominal pain denies any fever or chills. Patient presented to facility with the above symptoms.  She had a dry CT of the abdomen which showed findings consistent with fluid overload. Pertinent to this visit patient does have history of hemodialysis and has missed last 2 sitting  for dialysis.  Patient was initially started on symptomatic treatment and then transferred to [facility] for dialysis needs. At the time of my examination patient is sitting comfortably in bed complains of 10 out of 10 abdominal pain and is requesting IV pain medication." per Dr. Thedore Mins H&P    LABS: Hgb 8.3 Hct 24.4 Platelet Count 105 RBC 2.67 Glucose 103 BUN 66 Creatinine 10.74 Carbon Dioxide 19.3 Calcium  7.3 Anion gap 20.4 EGFR 6 Alkaline Phosphatase 256 Albumin 2.9  Lipase 138     CT Abdomen Pelvis dry:  No acute abdominopelvic process.    VITALS: T98 P99 R18 BP183/110 Sat 98%    ASSESSMENT AND PLAN:  Abdominal pain diarrhea CT scan unremarkable no recent antibiotic use admitted for observation mild likely is elevated secondary to nausea vomiting do not believe pancreatitis. We will treat symptomatically.   Send  stool for C. Difficile. 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    - Anemia of chronic disease follow H&H. Patient is in-house    - Chronic systolic congestive heart failure euvolemic on beta blockers    - History of drug-seeking behavior patient informed IV narcotics would not be dispensed.    - Hypertension home dose of Norvasc Coreg to be initiated    - DVT prophylaxis    - Admitted as an observation    PATIENT LEFT AMA ON 06/24/2016 @ 1053    Emmaleah Meroney L. Danella Penton RN BSN  [facility]  Utilization Review  [phone]  Fax [phone]

## 2016-06-26 LAB — ECG 12-LEAD
P Wave Axis: 65 deg
P-R Interval: 185 ms
Patient Age: 45 years
Q-T Interval(Corrected): 489 ms
Q-T Interval: 385 ms
QRS Axis: 22 deg
QRS Duration: 108 ms
T Axis: 73 years
Ventricular Rate: 97 //min

## 2016-06-27 ENCOUNTER — Inpatient Hospital Stay
Admit: 2016-06-27 | Discharge: 2016-06-27 | Disposition: A | Discharge status: Home / Self Care | Payer: MEDICARE | Attending: Emergency Medicine

## 2016-06-27 ENCOUNTER — Inpatient Hospital Stay
Admit: 2016-06-27 | Discharge: 2016-06-28 | Discharge status: Against Medical Advice | Payer: MEDICARE | Attending: General Acute Care Hospital | Admitting: General Acute Care Hospital

## 2016-06-27 ENCOUNTER — Emergency Department: Admit: 2016-06-27 | Payer: MEDICARE

## 2016-06-27 DIAGNOSIS — A0471 Enterocolitis due to Clostridium difficile, recurrent: Secondary | ICD-10-CM

## 2016-06-27 DIAGNOSIS — E1143 Type 2 diabetes mellitus with diabetic autonomic (poly)neuropathy: Secondary | ICD-10-CM

## 2016-06-27 LAB — CBC WITH AUTOMATED DIFF
ABS. BASOPHILS: 0 10*3/uL (ref 0.0–0.1)
ABS. BASOPHILS: 0 10*3/uL (ref 0.0–0.1)
ABS. EOSINOPHILS: 0.4 10*3/uL (ref 0.0–0.4)
ABS. EOSINOPHILS: 0.4 10*3/uL (ref 0.0–0.4)
ABS. IMM. GRANS.: 0 10*3/uL (ref 0.00–0.04)
ABS. IMM. GRANS.: 0 10*3/uL (ref 0.00–0.04)
ABS. LYMPHOCYTES: 0.4 10*3/uL — ABNORMAL LOW (ref 0.8–3.5)
ABS. LYMPHOCYTES: 0.4 10*3/uL — ABNORMAL LOW (ref 0.8–3.5)
ABS. MONOCYTES: 0.4 10*3/uL (ref 0.0–1.0)
ABS. MONOCYTES: 0.4 10*3/uL (ref 0.0–1.0)
ABS. NEUTROPHILS: 4 10*3/uL (ref 1.8–8.0)
ABS. NEUTROPHILS: 4.3 10*3/uL (ref 1.8–8.0)
ABSOLUTE NRBC: 0 10*3/uL (ref 0.00–0.01)
ABSOLUTE NRBC: 0 10*3/uL (ref 0.00–0.01)
BASOPHILS: 0 % (ref 0–1)
BASOPHILS: 1 % (ref 0–1)
EOSINOPHILS: 7 % (ref 0–7)
EOSINOPHILS: 8 % — ABNORMAL HIGH (ref 0–7)
HCT: 24 % — ABNORMAL LOW (ref 36.6–50.3)
HCT: 25.2 % — ABNORMAL LOW (ref 36.6–50.3)
HGB: 7.6 g/dL — ABNORMAL LOW (ref 12.1–17.0)
HGB: 7.8 g/dL — ABNORMAL LOW (ref 12.1–17.0)
IMMATURE GRANULOCYTES: 0 % (ref 0.0–0.5)
IMMATURE GRANULOCYTES: 0 % (ref 0.0–0.5)
LYMPHOCYTES: 7 % — ABNORMAL LOW (ref 12–49)
LYMPHOCYTES: 7 % — ABNORMAL LOW (ref 12–49)
MCH: 29.6 PG (ref 26.0–34.0)
MCH: 31.6 PG (ref 26.0–34.0)
MCHC: 30.2 g/dL (ref 30.0–36.5)
MCHC: 32.5 g/dL (ref 30.0–36.5)
MCV: 97.2 FL (ref 80.0–99.0)
MCV: 98.1 FL (ref 80.0–99.0)
MONOCYTES: 8 % (ref 5–13)
MONOCYTES: 8 % (ref 5–13)
MPV: 10 FL (ref 8.9–12.9)
MPV: 10.6 FL (ref 8.9–12.9)
NEUTROPHILS: 77 % — ABNORMAL HIGH (ref 32–75)
NEUTROPHILS: 78 % — ABNORMAL HIGH (ref 32–75)
NRBC: 0 PER 100 WBC
NRBC: 0 PER 100 WBC
PLATELET: 111 10*3/uL — ABNORMAL LOW (ref 150–400)
PLATELET: 119 10*3/uL — ABNORMAL LOW (ref 150–400)
RBC: 2.47 M/uL — ABNORMAL LOW (ref 4.10–5.70)
RBC: 2.57 M/uL — ABNORMAL LOW (ref 4.10–5.70)
RDW: 17.3 % — ABNORMAL HIGH (ref 11.5–14.5)
RDW: 17.3 % — ABNORMAL HIGH (ref 11.5–14.5)
WBC: 5.2 10*3/uL (ref 4.1–11.1)
WBC: 5.5 10*3/uL (ref 4.1–11.1)

## 2016-06-27 LAB — LIPASE: Lipase: 297 U/L (ref 73–393)

## 2016-06-27 LAB — CK W/ CKMB & INDEX
CK - MB: 31.8 NG/ML — ABNORMAL HIGH (ref <3.6)
CK-MB Index: 1.6 (ref 0–2.5)
CK: 1960 U/L — ABNORMAL HIGH (ref 39–308)

## 2016-06-27 LAB — METABOLIC PANEL, COMPREHENSIVE
A-G Ratio: 0.8 — ABNORMAL LOW (ref 1.1–2.2)
ALT (SGPT): 17 U/L (ref 12–78)
AST (SGOT): 32 U/L (ref 15–37)
Albumin: 2.7 g/dL — ABNORMAL LOW (ref 3.5–5.0)
Alk. phosphatase: 276 U/L — ABNORMAL HIGH (ref 45–117)
Anion gap: 10 mmol/L (ref 5–15)
BUN/Creatinine ratio: 5 — ABNORMAL LOW (ref 12–20)
BUN: 39 MG/DL — ABNORMAL HIGH (ref 6–20)
Bilirubin, total: 0.3 MG/DL (ref 0.2–1.0)
CO2: 25 mmol/L (ref 21–32)
Calcium: 6.7 MG/DL — ABNORMAL LOW (ref 8.5–10.1)
Chloride: 102 mmol/L (ref 97–108)
Creatinine: 7.75 MG/DL — ABNORMAL HIGH (ref 0.70–1.30)
GFR est AA: 9 mL/min/{1.73_m2} — ABNORMAL LOW (ref >60)
GFR est non-AA: 8 mL/min/{1.73_m2} — ABNORMAL LOW (ref >60)
Globulin: 3.6 g/dL (ref 2.0–4.0)
Glucose: 99 mg/dL (ref 65–100)
Potassium: 4.8 mmol/L (ref 3.5–5.1)
Protein, total: 6.3 g/dL — ABNORMAL LOW (ref 6.4–8.2)
Sodium: 137 mmol/L (ref 136–145)

## 2016-06-27 LAB — AMYLASE: Amylase: 154 U/L — ABNORMAL HIGH (ref 25–115)

## 2016-06-27 LAB — TROPONIN I: Troponin-I, Qt.: 0.11 ng/mL — ABNORMAL HIGH (ref <0.05)

## 2016-06-27 MED ORDER — SODIUM CHLORIDE 0.9% BOLUS IV
0.9 % | Freq: Once | INTRAVENOUS | Status: AC
Start: 2016-06-27 — End: 2016-06-27
  Administered 2016-06-27: 11:00:00 via INTRAVENOUS

## 2016-06-27 MED ORDER — SODIUM CHLORIDE 0.9 % IJ SYRG
Freq: Once | INTRAMUSCULAR | Status: AC
Start: 2016-06-27 — End: 2016-06-27
  Administered 2016-06-27: 11:00:00 via INTRAVENOUS

## 2016-06-27 MED ORDER — SODIUM CHLORIDE 0.9% BOLUS IV
0.9 % | INTRAVENOUS | Status: AC
Start: 2016-06-27 — End: 2016-06-27
  Administered 2016-06-27: 10:00:00 via INTRAVENOUS

## 2016-06-27 MED ORDER — SODIUM CHLORIDE 0.9% BOLUS IV
0.9 % | INTRAVENOUS | Status: DC
Start: 2016-06-27 — End: 2016-06-27
  Administered 2016-06-27: 10:00:00 via INTRAVENOUS

## 2016-06-27 MED ORDER — IOPAMIDOL 76 % IV SOLN
370 mg iodine /mL (76 %) | Freq: Once | INTRAVENOUS | Status: AC
Start: 2016-06-27 — End: 2016-06-27
  Administered 2016-06-27: 11:00:00 via INTRAVENOUS

## 2016-06-27 MED ORDER — METOCLOPRAMIDE 5 MG/ML IJ SOLN
5 mg/mL | INTRAMUSCULAR | Status: AC
Start: 2016-06-27 — End: 2016-06-27
  Administered 2016-06-27: 11:00:00 via INTRAVENOUS

## 2016-06-27 MED ORDER — ONDANSETRON 8 MG TAB, RAPID DISSOLVE
8 mg | ORAL_TABLET | Freq: Three times a day (TID) | ORAL | 0 refills | Status: DC | PRN
Start: 2016-06-27 — End: 2016-07-21

## 2016-06-27 MED ORDER — HYDROMORPHONE (PF) 1 MG/ML IJ SOLN
1 mg/mL | INTRAMUSCULAR | Status: AC
Start: 2016-06-27 — End: 2016-06-27
  Administered 2016-06-27: 10:00:00 via INTRAVENOUS

## 2016-06-27 MED ORDER — ALBUTEROL SULFATE HFA 90 MCG/ACTUATION AEROSOL INHALER
90 mcg/actuation | RESPIRATORY_TRACT | 1 refills | Status: DC | PRN
Start: 2016-06-27 — End: 2016-08-20

## 2016-06-27 MED FILL — HYDROMORPHONE (PF) 1 MG/ML IJ SOLN: 1 mg/mL | INTRAMUSCULAR | Qty: 2

## 2016-06-27 MED FILL — NORMAL SALINE FLUSH 0.9 % INJECTION SYRINGE: INTRAMUSCULAR | Qty: 10

## 2016-06-27 MED FILL — METOCLOPRAMIDE 5 MG/ML IJ SOLN: 5 mg/mL | INTRAMUSCULAR | Qty: 2

## 2016-06-27 MED FILL — SODIUM CHLORIDE 0.9 % IV: INTRAVENOUS | Qty: 1000

## 2016-06-27 MED FILL — ISOVUE-370  76 % INTRAVENOUS SOLUTION: 370 mg iodine /mL (76 %) | INTRAVENOUS | Qty: 100

## 2016-06-27 MED FILL — SODIUM CHLORIDE 0.9 % IV: INTRAVENOUS | Qty: 100

## 2016-06-27 NOTE — H&P (Signed)
Hospitalist Admission NoteNAME: David [name]   DOB:  16-Feb-1971   MRN:  [id number]     Date/Time:  06/27/2016 11:50 PM    Patient PCP: None  ______________________________________________________________________  Given the patient's current clinical presentation, I have a high level of concern for decompensation if discharged from the emergency department.  Complex decision making was performed, which includes reviewing the patient's available past medical records, laboratory results, and x-ray films.       My assessment of this patient's clinical condition and my plan of care is as follows.    Assessment / Plan:  Hypertensive Urgency  Anasarca in settings of ESRD HD  Due to Non Compliance and nausea and vomiting  Admit to PCU  Start nitro drip with scheduled IV metoprolol and PRN IV hydralazine  ED physician spoke to nephrology consult for arranging HD  He seems not to have OP HD center and seems got fired, will see nephrology recommendations    Nausea, vomiting, Abdominal Pain, chest pain and diarrhea  In settings of recurrent C.diff   High suspicion of drug seeking behavior being recurrent ED visits and signing AMA from inpatient and ED when declined further IV pain meds  unfortunately does have organ reason to be in pain with ascites   Doubt c.diff causing pain as no colitis on CT  Will ask US guided paracentesis to relieve ascites and will give low dose IV dilaudid till relieve the distension  Chest pain is related to accelerated HTN, should get better with HD  Serial Tn  GI consult for evaluation of stool transplant  IV flagyl and rectal vancomycin   Unable to give PO due to nausea, vomiting  Will not be surprised if signs AMA again, currently in agreement to be treated    ESRD (end stage renal disease) on dialysis   Nephrology has been consulted for HD by ED physician over the phone    H/O noncompliance with medical treatment, presenting hazards to health     Asthma   Not wheezing  PRN Nebs     Code Status: Full  Surrogate Decision Maker: Sister    DVT Prophylaxis: SCDs    Baseline: functional      Subjective:   CHIEF COMPLAINT: chest and abdominal pain, nausea, vomiting and diarrhea    HISTORY OF PRESENT ILLNESS:     David [name] is a 46 y.o.  African American male who presents with chest and abdominal pain and nausea, vomiting and diarrhea. As per patient he is been having 10/10 chest and abdominal pain which is constant and is been going on for a week. Pt also reports, fever of 101 at home associated with chills. Pt also reported recurrent c.diff and claims to have 16 episodes and claims have been tried on vanco and dificid without help. Pt reports recurrent abdominal distension requiring recurring paracentesis. Pt noted to be admitted recently for the same and signed AMA when decline IV pain meds. He also noted to have went to [facility] ED and signed AMA when declined further IV pain meds. Pt claims to have been fired from dialysis center because of missing HD. In Ed pt noted to have high blood pressure, Anasarca on CT along with ascites and pleural effusion    We were asked to admit for work up and evaluation of the above problems.     Past Medical History:   Diagnosis Date   ??? Adverse effect of anesthesia     sleep apnea uses oxygen  at night    ??? Ascites    ??? Asthma    ??? C. difficile colitis    ??? CKD (chronic kidney disease) stage V requiring chronic dialysis ([place])    ??? HTN (hypertension)    ??? SBP (spontaneous bacterial peritonitis) [facility])         Past Surgical History:   Procedure Laterality Date   ??? COLONOSCOPY N/A 03/04/2016    COLONOSCOPY performed by Macie Burows, MD at [facility]   ??? HX HERNIA REPAIR     ??? SIGMOIDOSCOPY,DIAGNOSTIC  03/04/2016            Social History   Substance Use Topics   ??? Smoking status: Heavy Tobacco Smoker     Packs/day: 0.50     Years: 10.00   ??? Smokeless tobacco: Never Used   ??? Alcohol use No        Family History   Problem Relation Age of Onset    ??? Hypertension Mother      Allergies   Allergen Reactions   ??? Lisinopril Shortness of Breath   ??? Morphine Shortness of Breath   ??? Toradol [Ketorolac] Shortness of Breath        Prior to Admission medications    Medication Sig Start Date End Date Taking? Authorizing Provider   ondansetron (ZOFRAN ODT) 8 mg disintegrating tablet Take 1 Tab by mouth every eight (8) hours as needed for Nausea. 06/27/16   Eula Listen, MD   albuterol (PROVENTIL HFA, VENTOLIN HFA, PROAIR HFA) 90 mcg/actuation inhaler Take 2 Puffs by inhalation every four (4) hours as needed for Wheezing. 06/27/16   Eula Listen, MD   sevelamer (RENAGEL) 400 mg tablet Take 800 mg by mouth three (3) times daily (with meals).    Phys Other, MD   carvedilol (COREG) 12.5 mg tablet Take  by mouth two (2) times daily (with meals).    Phys Other, MD   hydrALAZINE (APRESOLINE) 100 mg tablet Take 100 mg by mouth three (3) times daily.    Phys Other, MD   amLODIPine (NORVASC) 10 mg tablet Take 10 mg by mouth daily.    Phys Other, MD   pantoprazole (PROTONIX) 40 mg tablet Take 40 mg by mouth daily.    Phys Other, MD   furosemide (LASIX) 80 mg tablet Take 80 mg by mouth daily.    Phys Other, MD   albuterol (PROVENTIL HFA, VENTOLIN HFA, PROAIR HFA) 90 mcg/actuation inhaler Take 2 Puffs by inhalation every four (4) hours as needed for Wheezing.    Phys Other, MD       REVIEW OF SYSTEMS:     I am not able to complete the review of systems because:   The patient is intubated and sedated    The patient has altered mental status due to his acute medical problems    The patient has baseline aphasia from prior stroke(s)    The patient has baseline dementia and is not reliable historian    The patient is in acute medical distress and unable to provide information           Total of 12 systems reviewed as follows:       POSITIVE= underlined text  Negative = text not underlined  General:  fever, chills, sweats, generalized weakness, weight loss/gain,      loss of appetite    Eyes:    blurred vision, eye pain, loss of vision, double vision  ENT:    rhinorrhea, pharyngitis  Respiratory:   cough, sputum production, SOB, DOE, wheezing, pleuritic pain   Cardiology:   chest pain, palpitations, orthopnea, PND, edema, syncope   Gastrointestinal:  abdominal pain , N/V, diarrhea, dysphagia, constipation, bleeding   Genitourinary:  frequency, urgency, dysuria, hematuria, incontinence   Muskuloskeletal :  arthralgia, myalgia, back pain  Hematology:  easy bruising, nose or gum bleeding, lymphadenopathy   Dermatological: rash, ulceration, pruritis, color change / jaundice  Endocrine:   hot flashes or polydipsia   Neurological:  headache, dizziness, confusion, focal weakness, paresthesia,     Speech difficulties, memory loss, gait difficulty  Psychological: Feelings of anxiety, depression, agitation    Objective:   VITALS:    Visit Vitals   ??? BP (!) 201/110 (BP 1 Location: Right arm, BP Patient Position: At rest)   ??? Pulse (!) 109   ??? Temp 98.5 ??F (36.9 ??C)   ??? Resp 16   ??? Ht '6\' 1"'$  (1.854 m)   ??? Wt 111 kg (244 lb 11.4 oz)   ??? SpO2 96%   ??? BMI 32.29 kg/m2       PHYSICAL EXAM:    General:    Alert, cooperative, no distress, appears stated age.     HEENT: Atraumatic, anicteric sclerae, pink conjunctivae     No oral ulcers, mucosa moist, throat clear, dentition fair  Neck:  Supple, symmetrical,  thyroid: non tender  Lungs:   Clear to auscultation bilaterally.  No Wheezing or Rhonchi. No rales.  Chest wall:  No tenderness  No Accessory muscle use.  Heart:   Regular  rhythm,  No  murmur   No edema  Abdomen:   Soft, tender without even touching (pt grimise prior to even touching), voluntary guarding. Not distended.  Bowel sounds normal  Extremities: No cyanosis.  No clubbing,      Skin turgor normal, Capillary refill normal, Radial dial pulse 2+  Skin:     Not pale.  Not Jaundiced  No rashes   Psych:  Good insight.  Not depressed.  Not anxious or agitated.   Neurologic: EOMs intact. No facial asymmetry. No aphasia or slurred speech. Symmetrical strength, Sensation grossly intact. Alert and oriented X 4.     _______________________________________________________________________  Care Plan discussed with:    Comments   Patient y    Family      RN y    Transport planner                    Consultant:  y ED physician   _______________________________________________________________________  Expected  Disposition:   Home with Family y   HH/PT/OT/RN    SNF/LTC    SAHR    ________________________________________________________________________  TOTAL TIME:  72 Minutes    Critical Care Provided   69  Minutes non procedure based      Comments    y Reviewed previous records   >50% of visit spent in counseling and coordination of care y Discussion with patient and questions answered       ________________________________________________________________________  Signed: Levonne Spiller, MD    Procedures: see electronic medical records for all procedures/Xrays and details which were not copied into this note but were reviewed prior to creation of Plan.    LAB DATA REVIEWED:    Recent Results (from the past 24 hour(s))   CBC WITH AUTOMATED DIFF    Collection Time: 06/27/16  4:26 AM   Result Value Ref Range    WBC 5.5 4.1 - 11.1 K/uL  RBC 2.47 (L) 4.10 - 5.70 M/uL    HGB 7.8 (L) 12.1 - 17.0 g/dL    HCT 24.0 (L) 36.6 - 50.3 %    MCV 97.2 80.0 - 99.0 FL    MCH 31.6 26.0 - 34.0 PG    MCHC 32.5 30.0 - 36.5 g/dL    RDW 17.3 (H) 11.5 - 14.5 %    PLATELET 119 (L) 150 - 400 K/uL    MPV 10.6 8.9 - 12.9 FL    NRBC 0.0 0 PER 100 WBC    ABSOLUTE NRBC 0.00 0.00 - 0.01 K/uL    NEUTROPHILS 78 (H) 32 - 75 %    LYMPHOCYTES 7 (L) 12 - 49 %    MONOCYTES 8 5 - 13 %    EOSINOPHILS 7 0 - 7 %    BASOPHILS 0 0 - 1 %    IMMATURE GRANULOCYTES 0 0.0 - 0.5 %    ABS. NEUTROPHILS 4.3 1.8 - 8.0 K/UL    ABS. LYMPHOCYTES 0.4 (L) 0.8 - 3.5 K/UL    ABS. MONOCYTES 0.4 0.0 - 1.0 K/UL    ABS. EOSINOPHILS 0.4 0.0 - 0.4 K/UL     ABS. BASOPHILS 0.0 0.0 - 0.1 K/UL    ABS. IMM. GRANS. 0.0 0.00 - 0.04 K/UL    DF SMEAR SCANNED      RBC COMMENTS OVALOCYTES  PRESENT        RBC COMMENTS MACROCYTOSIS  1+        RBC COMMENTS TEARDROP CELLS  PRESENT        RBC COMMENTS ANISOCYTOSIS  1+       METABOLIC PANEL, COMPREHENSIVE    Collection Time: 06/27/16  4:26 AM   Result Value Ref Range    Sodium 137 136 - 145 mmol/L    Potassium 4.8 3.5 - 5.1 mmol/L    Chloride 102 97 - 108 mmol/L    CO2 25 21 - 32 mmol/L    Anion gap 10 5 - 15 mmol/L    Glucose 99 65 - 100 mg/dL    BUN 39 (H) 6 - 20 MG/DL    Creatinine 7.75 (H) 0.70 - 1.30 MG/DL    BUN/Creatinine ratio 5 (L) 12 - 20      GFR est AA 9 (L) >60 ml/min/1.62m    GFR est non-AA 8 (L) >60 ml/min/1.798m   Calcium 6.7 (L) 8.5 - 10.1 MG/DL    Bilirubin, total 0.3 0.2 - 1.0 MG/DL    ALT (SGPT) 17 12 - 78 U/L    AST (SGOT) 32 15 - 37 U/L    Alk. phosphatase 276 (H) 45 - 117 U/L    Protein, total 6.3 (L) 6.4 - 8.2 g/dL    Albumin 2.7 (L) 3.5 - 5.0 g/dL    Globulin 3.6 2.0 - 4.0 g/dL    A-G Ratio 0.8 (L) 1.1 - 2.2     CK W/ CKMB & INDEX    Collection Time: 06/27/16  4:26 AM   Result Value Ref Range    CK 1960 (H) 39 - 308 U/L    CK - MB 31.8 (H) <3.6 NG/ML    CK-MB Index 1.6 0 - 2.5     TROPONIN I    Collection Time: 06/27/16  4:26 AM   Result Value Ref Range    Troponin-I, Qt. 0.11 (H) <0.05 ng/mL   LIPASE    Collection Time: 06/27/16  4:26 AM   Result Value Ref Range  Lipase 297 73 - 393 U/L   AMYLASE    Collection Time: 06/27/16  4:26 AM   Result Value Ref Range    Amylase 154 (H) 25 - 115 U/L   EKG, 12 LEAD, INITIAL    Collection Time: 06/27/16  6:26 PM   Result Value Ref Range    Ventricular Rate 101 BPM    Atrial Rate 101 BPM    P-R Interval 184 ms    QRS Duration 84 ms    Q-T Interval 386 ms    QTC Calculation (Bezet) 500 ms    Calculated P Axis 80 degrees    Calculated R Axis 19 degrees    Calculated T Axis 89 degrees    Diagnosis       Sinus tachycardia  Possible Left atrial enlargement   Low voltage QRS  Cannot rule out Anterior infarct , age undetermined  Abnormal ECG  When compared with ECG of 21-May-2016 21:05,  No significant change was found     CBC WITH AUTOMATED DIFF    Collection Time: 06/27/16  7:18 PM   Result Value Ref Range    WBC 5.2 4.1 - 11.1 K/uL    RBC 2.57 (L) 4.10 - 5.70 M/uL    HGB 7.6 (L) 12.1 - 17.0 g/dL    HCT 25.2 (L) 36.6 - 50.3 %    MCV 98.1 80.0 - 99.0 FL    MCH 29.6 26.0 - 34.0 PG    MCHC 30.2 30.0 - 36.5 g/dL    RDW 17.3 (H) 11.5 - 14.5 %    PLATELET 111 (L) 150 - 400 K/uL    MPV 10.0 8.9 - 12.9 FL    NRBC 0.0 0 PER 100 WBC    ABSOLUTE NRBC 0.00 0.00 - 0.01 K/uL    NEUTROPHILS 77 (H) 32 - 75 %    LYMPHOCYTES 7 (L) 12 - 49 %    MONOCYTES 8 5 - 13 %    EOSINOPHILS 8 (H) 0 - 7 %    BASOPHILS 1 0 - 1 %    IMMATURE GRANULOCYTES 0 0.0 - 0.5 %    ABS. NEUTROPHILS 4.0 1.8 - 8.0 K/UL    ABS. LYMPHOCYTES 0.4 (L) 0.8 - 3.5 K/UL    ABS. MONOCYTES 0.4 0.0 - 1.0 K/UL    ABS. EOSINOPHILS 0.4 0.0 - 0.4 K/UL    ABS. BASOPHILS 0.0 0.0 - 0.1 K/UL    ABS. IMM. GRANS. 0.0 0.00 - 0.04 K/UL    DF AUTOMATED     METABOLIC PANEL, COMPREHENSIVE    Collection Time: 06/27/16  7:18 PM   Result Value Ref Range    Sodium 137 136 - 145 mmol/L    Potassium 4.6 3.5 - 5.1 mmol/L    Chloride 102 97 - 108 mmol/L    CO2 28 21 - 32 mmol/L    Anion gap 7 5 - 15 mmol/L    Glucose 95 65 - 100 mg/dL    BUN 42 (H) 6 - 20 MG/DL    Creatinine 8.52 (H) 0.70 - 1.30 MG/DL    BUN/Creatinine ratio 5 (L) 12 - 20      GFR est AA 8 (L) >60 ml/min/1.85m    GFR est non-AA 7 (L) >60 ml/min/1.765m   Calcium 6.5 (L) 8.5 - 10.1 MG/DL    Bilirubin, total 0.4 0.2 - 1.0 MG/DL    ALT (SGPT) 17 12 - 78 U/L    AST (SGOT) 32 15 - 37  U/L    Alk. phosphatase 272 (H) 45 - 117 U/L    Protein, total 6.5 6.4 - 8.2 g/dL    Albumin 2.9 (L) 3.5 - 5.0 g/dL    Globulin 3.6 2.0 - 4.0 g/dL    A-G Ratio 0.8 (L) 1.1 - 2.2     PHOSPHORUS    Collection Time: 06/27/16  7:18 PM   Result Value Ref Range    Phosphorus 5.3 (H) 2.6 - 4.7 MG/DL   MAGNESIUM     Collection Time: 06/27/16  7:18 PM   Result Value Ref Range    Magnesium 1.9 1.6 - 2.4 mg/dL

## 2016-06-27 NOTE — ED Notes (Signed)
0411: TRIAGE: Patient arrives from home, independently, for abdominal pain/nausea/vomiting/diarrhea that began on Monday. Patient states, "I think I have c. Diff again." Patient has a [place] to the LEFT upper chest.

## 2016-06-27 NOTE — ED Notes (Signed)
Pt standing at his room door requesting more pain meds.  Will call Dr. Hetty ElyHeimbach.

## 2016-06-27 NOTE — ED Notes (Signed)
Report received from Erin, RN.

## 2016-06-27 NOTE — ED Provider Notes (Signed)
HPI Comments: The patient is a 46 year old male with a past medical history significant for hypertension, hypercholesterolemia, diabetes, gastroparesis, chronic kidney disease, hemodialysis dependent, multiple ED visits with admission to the hospital for chronic pain. The patient eventually signed out of the hospital when he doesn't get his way with pain medication. He presents to the ED with a complaint of intractable nausea, vomiting, diarrhea, abdominal pain that began approximately 2-3 days ago. The patient also stated that he has not had dialysis for over a week, because he's been kicked out of dialysis center for noncompliance. He denies any melena, hematemesis, hematochezia, chest pain, headache, neck or back pain, dysuria, dizziness, weakness, or numbness.    Patient is a 46 y.o. male presenting with abdominal pain, nausea, vomiting, and diarrhea.   Abdominal Pain    Associated symptoms include diarrhea, nausea and vomiting.   Nausea    Associated symptoms include abdominal pain and diarrhea.   Vomiting    Associated symptoms include abdominal pain and diarrhea.   Diarrhea    Associated symptoms include diarrhea, nausea and vomiting.        Past Medical History:   Diagnosis Date   ??? Adverse effect of anesthesia     sleep apnea uses oxygen at night    ??? Ascites    ??? Asthma    ??? C. difficile colitis    ??? CKD (chronic kidney disease) stage V requiring chronic dialysis ([facility])    ??? HTN (hypertension)    ??? SBP (spontaneous bacterial peritonitis) [facility])        Past Surgical History:   Procedure Laterality Date   ??? COLONOSCOPY N/A 03/04/2016    COLONOSCOPY performed by Sandy Salaam, MD at [facility]   ??? HX HERNIA REPAIR     ??? SIGMOIDOSCOPY,DIAGNOSTIC  03/04/2016              Family History:   Problem Relation Age of Onset   ??? Hypertension Mother        Social History     Social History   ??? Marital status: UNKNOWN     Spouse name: N/A   ??? Number of children: N/A   ??? Years of education: N/A      Occupational History   ??? Not on file.     Social History Main Topics   ??? Smoking status: Heavy Tobacco Smoker     Packs/day: 0.50     Years: 10.00   ??? Smokeless tobacco: Never Used   ??? Alcohol use No   ??? Drug use: Yes     Special: Prescription, OTC   ??? Sexual activity: Not on file     Other Topics Concern   ??? Not on file     Social History Narrative         ALLERGIES: Lisinopril; Morphine; and Toradol [ketorolac]    Review of Systems   Gastrointestinal: Positive for abdominal pain, diarrhea, nausea and vomiting.   All other systems reviewed and are negative.      Vitals:    06/27/16 0430 06/27/16 0630 06/27/16 0700 06/27/16 0832   BP: 156/80 136/88 (!) 154/93 148/89   Pulse:  100 99 95   Resp:  18 18 17    Temp:  98.8 ??F (37.1 ??C)  98.7 ??F (37.1 ??C)   SpO2: 97% 94% 97% 97%            Physical Exam   Nursing note and vitals reviewed.       CONSTITUTIONAL:  Well-appearing; well-nourished; in no apparent distress  HEAD: Normocephalic; atraumatic  EYES: PERRL; EOM intact; conjunctiva and sclera are clear bilaterally.  ENT: No rhinorrhea; normal pharynx with no tonsillar hypertrophy; mucous membranes pink/moist, no erythema, no exudate.  NECK: Supple; non-tender; no cervical lymphadenopathy  CARD: Normal S1, S2; no murmurs, rubs, or gallops. Regular rate and rhythm.  RESP: Normal respiratory effort; breath sounds clear and equal bilaterally; no wheezes, rhonchi, or rales.  ABD: Normal bowel sounds; non-distended; non-tender; no palpable organomegaly, no masses, no bruits.  Back Exam: Normal inspection; no vertebral point tenderness, no CVA tenderness. Normal range of motion.  EXT: Normal ROM in all four extremities; non-tender to palpation; no swelling or deformity; distal pulses are normal, no edema.  SKIN: Warm; dry; no rash.  NEURO:Alert and oriented x 3, coherent, NII-XII grossly intact, sensory and motor are non-focal.        MDM  Number of Diagnoses or Management Options  Chronic abdominal pain:    ESRD (end stage renal disease) on dialysis [facility](HCC):   Gastroparesis:   H/O noncompliance with medical treatment, presenting hazards to health:   Diagnosis management comments: Assessment: 46 year old male with multiple chronic conditions, who presents with nausea, vomiting, and diarrhea. The patient's also dialysis-dependent and did not comply with medication and dialysis instruction. In fact, he is on dialysis for 7 days. He is hemodynamically stable at this time. Patient has chronic pain requiring narcotic dependency.      Plan:   EKG/ lab/ chest x-ray/ CT scan of the abdomen and pelvis/ antiemetics with analgesia/ p.o. Challenge/ serial exam/ Monitor and Reevaluate.          ED Course       Procedures    Progress Note:   Pt has been reexamined by Loni BeckwithWilliam Tayloranne Lekas, MD. Pt is feeling much better. Symptoms have improved. All available results have been reviewed with pt and any available family. Pt understands sx, dx, and tx in ED. Care plan has been outlined and questions have been answered. Pt is ready to go home. Will send home on gastroparesis/dehydration/chronic abdominal pain instructions. Prescription of Zofran. The [facility]. Outpatient referral with PCP as needed. Written by Loni BeckwithWilliam Fenton Candee, MD,10:29 AM    .   .

## 2016-06-27 NOTE — ED Notes (Signed)
Assumed care of pt at this time.  Pt states that he has abdominal pain with N/V/D for about a week now.  Pt states that he has previous hx of cdiff and was seen at a hospital near where he lives and dx with cdiff there.  Pt states that he needs treatment and was here but left AMA bc he did not like his nurse.  Pt also is a dialysis pt and has no been in a week bc he was released for non-compliance.  Assessment done at this time.  Call bell in reach.

## 2016-06-27 NOTE — ED Notes (Addendum)
[id number]: Patient difficult stick. Attempted IV in locations patient requested, unsuccessful. Patient now requesting EJ, MD notified.     [id number]: Patient ambulated to restroom, attempted to give stool sample. Unable to provide sample at this time. Dr. Mindi SlickerAzie aware.     0700: Patient and present family updated on plan of care and treatment, verbalized understanding. No questions or needs expressed at this time.

## 2016-06-27 NOTE — ED Notes (Signed)
MD had reviewed discharge instructions with the patient.  The patient verbalized understanding.

## 2016-06-27 NOTE — ED Provider Notes (Signed)
EMERGENCY DEPARTMENT HISTORY AND PHYSICAL EXAM      Date: 06/27/2016  Patient Name: David [name]    History of Presenting Illness     Chief Complaint   Patient presents with   ??? Abdominal Pain     patient left AMA from admission on 3/5 after receiving dialysis. has had pain, vomiting, and diarrhea since and has not had dialysis; lack of compliance with dialysis so does not have regular facility   ??? Diarrhea   ??? Vomiting       History Provided By: Patient    HPI: David [name], 46 y.o. male well known to ED with PMHx significant for stage V CKD, HTN, gastroparesis, presents ambulatory with cc of N/V/D and diffuse abd pain x 1.5 weeks. Pt states he has been unable to tolerate much PO. He notes that his diarrhea smells similar to prior incidents of C diff; he states he was diagnosed with C diff at [facility] last week. Per chart review, pt was admitted here from 2/28-3/05 for C diff but left AMA when he did not receive the pain medication he wanted. Pt has been seen multiple times for gastroparesis. Pt was seen earlier today at [facility] ED for same symptoms. He states that he has not been dialyzed in over a week because he was kicked out of his last center for noncompliance. Pt denies CP, SOB, HA, or dysuria.       PCP: None    There are no other complaints, changes, or physical findings at this time.    Current Facility-Administered Medications   Medication Dose Route Frequency Provider Last Rate Last Dose   ??? ondansetron (ZOFRAN) injection 4 mg  4 mg IntraVENous NOW David Derry, MD         Current Outpatient Prescriptions   Medication Sig Dispense Refill   ??? ondansetron (ZOFRAN ODT) 8 mg disintegrating tablet Take 1 Tab by mouth every eight (8) hours as needed for Nausea. 15 Tab 0   ??? albuterol (PROVENTIL HFA, VENTOLIN HFA, PROAIR HFA) 90 mcg/actuation inhaler Take 2 Puffs by inhalation every four (4) hours as needed for Wheezing. 1 Inhaler 1    ??? sevelamer (RENAGEL) 400 mg tablet Take 800 mg by mouth three (3) times daily (with meals).     ??? carvedilol (COREG) 12.5 mg tablet Take  by mouth two (2) times daily (with meals).     ??? hydrALAZINE (APRESOLINE) 100 mg tablet Take 100 mg by mouth three (3) times daily.     ??? amLODIPine (NORVASC) 10 mg tablet Take 10 mg by mouth daily.     ??? pantoprazole (PROTONIX) 40 mg tablet Take 40 mg by mouth daily.     ??? furosemide (LASIX) 80 mg tablet Take 80 mg by mouth daily.     ??? albuterol (PROVENTIL HFA, VENTOLIN HFA, PROAIR HFA) 90 mcg/actuation inhaler Take 2 Puffs by inhalation every four (4) hours as needed for Wheezing.         Past History     Past Medical History:  Past Medical History:   Diagnosis Date   ??? Adverse effect of anesthesia     sleep apnea uses oxygen at night    ??? Ascites    ??? Asthma    ??? C. difficile colitis    ??? CKD (chronic kidney disease) stage V requiring chronic dialysis ([place])    ??? HTN (hypertension)    ??? SBP (spontaneous bacterial peritonitis) ([place])  Past Surgical History:  Past Surgical History:   Procedure Laterality Date   ??? COLONOSCOPY N/A 03/04/2016    COLONOSCOPY performed by Macie Burows, MD at [facility]   ??? HX HERNIA REPAIR     ??? SIGMOIDOSCOPY,DIAGNOSTIC  03/04/2016            Family History:  Family History   Problem Relation Age of Onset   ??? Hypertension Mother        Social History:  Social History   Substance Use Topics   ??? Smoking status: Heavy Tobacco Smoker     Packs/day: 0.50     Years: 10.00   ??? Smokeless tobacco: Never Used   ??? Alcohol use No       Allergies:  Allergies   Allergen Reactions   ??? Lisinopril Shortness of Breath   ??? Morphine Shortness of Breath   ??? Toradol [Ketorolac] Shortness of Breath         Review of Systems   Review of Systems   Constitutional: Negative for activity change, chills and fever.   HENT: Negative for congestion and sore throat.    Eyes: Negative for pain and redness.    Respiratory: Negative for cough, chest tightness and shortness of breath.    Cardiovascular: Negative for chest pain and palpitations.   Gastrointestinal: Positive for abdominal pain, diarrhea, nausea and vomiting.   Genitourinary: Negative for dysuria, frequency and urgency.   Musculoskeletal: Negative for back pain and neck pain.   Skin: Negative for rash.   Neurological: Negative for syncope, light-headedness and headaches.   Psychiatric/Behavioral: Negative for confusion.   All other systems reviewed and are negative.      Physical Exam   Physical Exam   Constitutional: He is oriented to person, place, and time. He appears well-developed and well-nourished. No distress.   HENT:   Head: Normocephalic.   Nose: Nose normal.   Mouth/Throat: Oropharynx is clear and moist. No oropharyngeal exudate.   Eyes: Conjunctivae are normal. Pupils are equal, round, and reactive to light. No scleral icterus.   Neck: Normal range of motion. Neck supple. No JVD present. No tracheal deviation present. No thyromegaly present.   Cardiovascular: Normal rate, regular rhythm and intact distal pulses.  Exam reveals no gallop and no friction rub.    No murmur heard.  Pulmonary/Chest: Effort normal and breath sounds normal. No stridor. No respiratory distress. He has no wheezes. He has no rales.   Abdominal: Soft. Bowel sounds are normal. He exhibits no distension. There is no tenderness. There is no rebound and no guarding.   Musculoskeletal: Normal range of motion. He exhibits no edema.   Lymphadenopathy:     He has no cervical adenopathy.   Neurological: He is alert and oriented to person, place, and time. No cranial nerve deficit. He exhibits normal muscle tone. Coordination normal.   Skin: Skin is warm and dry. No rash noted. He is not diaphoretic. No erythema.   Psychiatric: He has a normal mood and affect. His behavior is normal.   Nursing note and vitals reviewed.        Diagnostic Study Results     Labs -      Recent Results (from the past 12 hour(s))   EKG, 12 LEAD, INITIAL    Collection Time: 06/27/16  6:26 PM   Result Value Ref Range    Ventricular Rate 101 BPM    Atrial Rate 101 BPM    P-R Interval 184 ms  QRS Duration 84 ms    Q-T Interval 386 ms    QTC Calculation (Bezet) 500 ms    Calculated P Axis 80 degrees    Calculated R Axis 19 degrees    Calculated T Axis 89 degrees    Diagnosis       Sinus tachycardia  Possible Left atrial enlargement  Low voltage QRS  Cannot rule out Anterior infarct , age undetermined  Abnormal ECG  When compared with ECG of 21-May-2016 21:05,  No significant change was found     CBC WITH AUTOMATED DIFF    Collection Time: 06/27/16  7:18 PM   Result Value Ref Range    WBC 5.2 4.1 - 11.1 K/uL    RBC 2.57 (L) 4.10 - 5.70 M/uL    HGB 7.6 (L) 12.1 - 17.0 g/dL    HCT 25.2 (L) 36.6 - 50.3 %    MCV 98.1 80.0 - 99.0 FL    MCH 29.6 26.0 - 34.0 PG    MCHC 30.2 30.0 - 36.5 g/dL    RDW 17.3 (H) 11.5 - 14.5 %    PLATELET 111 (L) 150 - 400 K/uL    MPV 10.0 8.9 - 12.9 FL    NRBC 0.0 0 PER 100 WBC    ABSOLUTE NRBC 0.00 0.00 - 0.01 K/uL    NEUTROPHILS 77 (H) 32 - 75 %    LYMPHOCYTES 7 (L) 12 - 49 %    MONOCYTES 8 5 - 13 %    EOSINOPHILS 8 (H) 0 - 7 %    BASOPHILS 1 0 - 1 %    IMMATURE GRANULOCYTES 0 0.0 - 0.5 %    ABS. NEUTROPHILS 4.0 1.8 - 8.0 K/UL    ABS. LYMPHOCYTES 0.4 (L) 0.8 - 3.5 K/UL    ABS. MONOCYTES 0.4 0.0 - 1.0 K/UL    ABS. EOSINOPHILS 0.4 0.0 - 0.4 K/UL    ABS. BASOPHILS 0.0 0.0 - 0.1 K/UL    ABS. IMM. GRANS. 0.0 0.00 - 0.04 K/UL    DF AUTOMATED     METABOLIC PANEL, COMPREHENSIVE    Collection Time: 06/27/16  7:18 PM   Result Value Ref Range    Sodium 137 136 - 145 mmol/L    Potassium 4.6 3.5 - 5.1 mmol/L    Chloride 102 97 - 108 mmol/L    CO2 28 21 - 32 mmol/L    Anion gap 7 5 - 15 mmol/L    Glucose 95 65 - 100 mg/dL    BUN 42 (H) 6 - 20 MG/DL    Creatinine 8.52 (H) 0.70 - 1.30 MG/DL    BUN/Creatinine ratio 5 (L) 12 - 20      GFR est AA 8 (L) >60 ml/min/1.64m     GFR est non-AA 7 (L) >60 ml/min/1.778m   Calcium 6.5 (L) 8.5 - 10.1 MG/DL    Bilirubin, total 0.4 0.2 - 1.0 MG/DL    ALT (SGPT) 17 12 - 78 U/L    AST (SGOT) 32 15 - 37 U/L    Alk. phosphatase 272 (H) 45 - 117 U/L    Protein, total 6.5 6.4 - 8.2 g/dL    Albumin 2.9 (L) 3.5 - 5.0 g/dL    Globulin 3.6 2.0 - 4.0 g/dL    A-G Ratio 0.8 (L) 1.1 - 2.2     PHOSPHORUS    Collection Time: 06/27/16  7:18 PM   Result Value Ref Range    Phosphorus 5.3 (H) 2.6 -  4.7 MG/DL   MAGNESIUM    Collection Time: 06/27/16  7:18 PM   Result Value Ref Range    Magnesium 1.9 1.6 - 2.4 mg/dL       Radiologic Studies -   CT Results  (Last 48 hours)               06/27/16 0555  CT ABD PELV W CONT Final result    Impression:  IMPRESSION:   Anasarca, ascites, and bilateral effusions.           Narrative:  EXAM:  CT ABD PELV W CONT       INDICATION: abd pain  Nausea, vomiting, Diarrhea       COMPARISON: Feb 28       CONTRAST:  100 mL of Isovue-370.       TECHNIQUE:    Following the uneventful intravenous administration of contrast, thin axial   images were obtained through the abdomen and pelvis. Coronal and sagittal   reconstructions were generated. Oral contrast was not administered. CT dose   reduction was achieved through use of a standardized protocol tailored for this   examination and automatic exposure control for dose modulation.       FINDINGS:    LUNG BASES: Small bilateral pleural effusions.   INCIDENTALLY IMAGED HEART AND MEDIASTINUM: Unremarkable.   LIVER: No mass or biliary dilatation.   GALLBLADDER: Unremarkable.   SPLEEN: No mass.   PANCREAS: No mass or ductal dilatation.   ADRENALS: Unremarkable.   KIDNEYS: Small kidneys bilaterally.   STOMACH: Unremarkable.   SMALL BOWEL: No dilatation or wall thickening.   COLON: No dilatation or wall thickening.   APPENDIX: Unremarkable.   PERITONEUM: Abdominal and pelvic ascites   RETROPERITONEUM: No lymphadenopathy or aortic aneurysm.   REPRODUCTIVE ORGANS: Normal    URINARY BLADDER: No mass or calculus.   BONES: Sclerotic bones compatible with the history of dialysis.   ADDITIONAL COMMENTS: Anasarca.                 Medical Decision Making   I am the first provider for this patient.    I reviewed the vital signs, available nursing notes, past medical history, past surgical history, family history and social history.    Vital Signs-Reviewed the patient's vital signs.  Patient Vitals for the past 12 hrs:   Temp Pulse Resp BP SpO2   06/27/16 1824 98.5 ??F (36.9 ??C) (!) 109 16 (!) 201/110 96 %       EKG interpretation: (Preliminary) 18:26  Rhythm: sinus tachycardia; and regular . Rate (approx.): 101; Axis: normal; PR interval: normal; QRS interval: normal ; ST/T wave: non-specific changes;David [name]    Records Reviewed: Old Medical Records    Provider Notes (Medical Decision Making):   DDx: noncompliance, C diff, gastroparesis    ED Course:   Initial assessment performed. The patients presenting problems have been discussed, and they are in agreement with the care plan formulated and outlined with them.  I have encouraged them to ask questions as they arise throughout their visit.    8:57 PM  Old medical records reviewed. 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 Pt left AMA.     Consult Note:  9:21 PM  David Derry, MD spoke with David Curet, MD  Specialty: Hospitalist  Discussed pt's hx, disposition, and available diagnostic and imaging results. Dr. Dorena [name] will admit pt but wants nephrology consulted.     Consult Note:  10:04 PM  David Blazer  Icey Tello, MD spoke with David Jock, MD  Specialty: Nephrology  Discussed pt's hx, disposition, and available diagnostic and imaging results. Dr. Loel [name] will arrange dialysis as needed.    Disposition:  Admit Note:  10:53 PM  Pt is being admitted by Dr. Dorena [name]. The results of their tests and reason(s) for their admission have been discussed with pt and/or available  family. They convey agreement and understanding for the need to be admitted and for admission diagnosis.    Diagnosis     Clinical Impression:   1. Intractable vomiting with nausea, unspecified vomiting type    2. Diarrhea, unspecified type    3. ESRD (end stage renal disease) on dialysis [facility])        Attestations:    This note is prepared by David [name], acting as a Scribe for David Derry, MD    David Derry, MD: The scribe's documentation has been prepared under my direction and personally reviewed by me in its entirety. I confirm that the notes above accurately reflects all work, treatment, procedures, and medical decision making performed by me.

## 2016-06-28 ENCOUNTER — Inpatient Hospital Stay: Payer: MEDICARE

## 2016-06-28 ENCOUNTER — Encounter

## 2016-06-28 LAB — METABOLIC PANEL, COMPREHENSIVE
A-G Ratio: 0.8 — ABNORMAL LOW (ref 1.1–2.2)
A-G Ratio: 0.9 — ABNORMAL LOW (ref 1.1–2.2)
ALT (SGPT): 17 U/L (ref 12–78)
ALT (SGPT): 17 U/L (ref 12–78)
AST (SGOT): 28 U/L (ref 15–37)
AST (SGOT): 32 U/L (ref 15–37)
Albumin: 2.9 g/dL — ABNORMAL LOW (ref 3.5–5.0)
Albumin: 2.9 g/dL — ABNORMAL LOW (ref 3.5–5.0)
Alk. phosphatase: 251 U/L — ABNORMAL HIGH (ref 45–117)
Alk. phosphatase: 272 U/L — ABNORMAL HIGH (ref 45–117)
Anion gap: 10 mmol/L (ref 5–15)
Anion gap: 7 mmol/L (ref 5–15)
BUN/Creatinine ratio: 5 — ABNORMAL LOW (ref 12–20)
BUN/Creatinine ratio: 5 — ABNORMAL LOW (ref 12–20)
BUN: 42 MG/DL — ABNORMAL HIGH (ref 6–20)
BUN: 45 MG/DL — ABNORMAL HIGH (ref 6–20)
Bilirubin, total: 0.4 MG/DL (ref 0.2–1.0)
Bilirubin, total: 0.4 MG/DL (ref 0.2–1.0)
CO2: 26 mmol/L (ref 21–32)
CO2: 28 mmol/L (ref 21–32)
Calcium: 6.5 MG/DL — ABNORMAL LOW (ref 8.5–10.1)
Calcium: 6.6 MG/DL — ABNORMAL LOW (ref 8.5–10.1)
Chloride: 102 mmol/L (ref 97–108)
Chloride: 104 mmol/L (ref 97–108)
Creatinine: 8.52 MG/DL — ABNORMAL HIGH (ref 0.70–1.30)
Creatinine: 9.01 MG/DL — ABNORMAL HIGH (ref 0.70–1.30)
GFR est AA: 8 mL/min/{1.73_m2} — ABNORMAL LOW (ref >60)
GFR est AA: 8 mL/min/{1.73_m2} — ABNORMAL LOW (ref >60)
GFR est non-AA: 6 mL/min/{1.73_m2} — ABNORMAL LOW (ref >60)
GFR est non-AA: 7 mL/min/{1.73_m2} — ABNORMAL LOW (ref >60)
Globulin: 3.2 g/dL (ref 2.0–4.0)
Globulin: 3.6 g/dL (ref 2.0–4.0)
Glucose: 76 mg/dL (ref 65–100)
Glucose: 95 mg/dL (ref 65–100)
Potassium: 4.5 mmol/L (ref 3.5–5.1)
Potassium: 4.6 mmol/L (ref 3.5–5.1)
Protein, total: 6.1 g/dL — ABNORMAL LOW (ref 6.4–8.2)
Protein, total: 6.5 g/dL (ref 6.4–8.2)
Sodium: 137 mmol/L (ref 136–145)
Sodium: 140 mmol/L (ref 136–145)

## 2016-06-28 LAB — CBC WITH AUTOMATED DIFF
ABS. BASOPHILS: 0 10*3/uL (ref 0.0–0.1)
ABS. EOSINOPHILS: 0.5 10*3/uL — ABNORMAL HIGH (ref 0.0–0.4)
ABS. IMM. GRANS.: 0 10*3/uL (ref 0.00–0.04)
ABS. LYMPHOCYTES: 0.5 10*3/uL — ABNORMAL LOW (ref 0.8–3.5)
ABS. MONOCYTES: 0.4 10*3/uL (ref 0.0–1.0)
ABS. NEUTROPHILS: 3.2 10*3/uL (ref 1.8–8.0)
ABSOLUTE NRBC: 0 10*3/uL (ref 0.00–0.01)
BASOPHILS: 0 % (ref 0–1)
EOSINOPHILS: 11 % — ABNORMAL HIGH (ref 0–7)
HCT: 23.4 % — ABNORMAL LOW (ref 36.6–50.3)
HGB: 7.3 g/dL — ABNORMAL LOW (ref 12.1–17.0)
IMMATURE GRANULOCYTES: 0 % (ref 0.0–0.5)
LYMPHOCYTES: 10 % — ABNORMAL LOW (ref 12–49)
MCH: 30.2 PG (ref 26.0–34.0)
MCHC: 31.2 g/dL (ref 30.0–36.5)
MCV: 96.7 FL (ref 80.0–99.0)
MONOCYTES: 9 % (ref 5–13)
MPV: 9.8 FL (ref 8.9–12.9)
NEUTROPHILS: 69 % (ref 32–75)
NRBC: 0 PER 100 WBC
PLATELET: 109 10*3/uL — ABNORMAL LOW (ref 150–400)
RBC: 2.42 M/uL — ABNORMAL LOW (ref 4.10–5.70)
RDW: 17.3 % — ABNORMAL HIGH (ref 11.5–14.5)
WBC: 4.7 10*3/uL (ref 4.1–11.1)

## 2016-06-28 LAB — EKG, 12 LEAD, INITIAL
Atrial Rate: 101 {beats}/min
Calculated P Axis: 80 degrees
Calculated R Axis: 19 degrees
Calculated T Axis: 89 degrees
P-R Interval: 184 ms
Q-T Interval: 386 ms
QRS Duration: 84 ms
QTC Calculation (Bezet): 500 ms
Ventricular Rate: 101 {beats}/min

## 2016-06-28 LAB — TROPONIN I: Troponin-I, Qt.: 0.09 ng/mL — ABNORMAL HIGH (ref <0.05)

## 2016-06-28 LAB — PHOSPHORUS: Phosphorus: 5.3 MG/DL — ABNORMAL HIGH (ref 2.6–4.7)

## 2016-06-28 LAB — MAGNESIUM: Magnesium: 1.9 mg/dL (ref 1.6–2.4)

## 2016-06-28 LAB — C. DIFFICILE (DNA): C. difficile (DNA): POSITIVE — AB

## 2016-06-28 LAB — EKG 12-LEAD
Atrial Rate: 101 {beats}/min
P Axis: 80 degrees
P-R Interval: 184 ms
Q-T Interval: 386 ms
QRS Duration: 84 ms
QTc Calculation (Bazett): 500 ms
R Axis: 19 degrees
T Axis: 89 degrees
Ventricular Rate: 101 {beats}/min

## 2016-06-28 MED ORDER — HYDRALAZINE 20 MG/ML IJ SOLN
20 mg/mL | INTRAMUSCULAR | Status: AC
Start: 2016-06-28 — End: 2016-06-27
  Administered 2016-06-28: 01:00:00 via INTRAVENOUS

## 2016-06-28 MED ORDER — SODIUM CHLORIDE 0.9 % IJ SYRG
INTRAMUSCULAR | Status: DC | PRN
Start: 2016-06-28 — End: 2016-06-28

## 2016-06-28 MED ORDER — HYDRALAZINE 20 MG/ML IJ SOLN
20 mg/mL | Freq: Four times a day (QID) | INTRAMUSCULAR | Status: DC | PRN
Start: 2016-06-28 — End: 2016-06-28

## 2016-06-28 MED ORDER — ONDANSETRON (PF) 4 MG/2 ML INJECTION
4 mg/2 mL | INTRAMUSCULAR | Status: AC
Start: 2016-06-28 — End: 2016-06-27
  Administered 2016-06-28: 01:00:00 via INTRAVENOUS

## 2016-06-28 MED ORDER — HYDROMORPHONE 2 MG/ML INJECTION SOLUTION
2 mg/mL | INTRAMUSCULAR | Status: DC | PRN
Start: 2016-06-28 — End: 2016-06-28
  Administered 2016-06-28 (×4): via INTRAVENOUS

## 2016-06-28 MED ORDER — METOPROLOL TARTRATE 5 MG/5 ML IV SOLN
5 mg/ mL | Freq: Four times a day (QID) | INTRAVENOUS | Status: DC
Start: 2016-06-28 — End: 2016-06-28
  Administered 2016-06-28 (×2): via INTRAVENOUS

## 2016-06-28 MED ORDER — ONDANSETRON (PF) 4 MG/2 ML INJECTION
4 mg/2 mL | INTRAMUSCULAR | Status: AC
Start: 2016-06-28 — End: 2016-06-27
  Administered 2016-06-28: 03:00:00 via INTRAVENOUS

## 2016-06-28 MED ORDER — NITROGLYCERIN IN D5W 200 MCG/ML IV
50 mg/2 mL (200 mcg/mL) | INTRAVENOUS | Status: DC
Start: 2016-06-28 — End: 2016-06-27

## 2016-06-28 MED ORDER — HYDROMORPHONE (PF) 2 MG/ML IJ SOLN
2 mg/mL | INTRAMUSCULAR | Status: AC
Start: 2016-06-28 — End: 2016-06-27
  Administered 2016-06-28: 01:00:00 via INTRAVENOUS

## 2016-06-28 MED ORDER — VANCOMYCIN 500 MG IV SOLR
500 mg | Freq: Four times a day (QID) | INTRAVENOUS | Status: DC
Start: 2016-06-28 — End: 2016-06-28
  Administered 2016-06-28 (×2): via RECTAL

## 2016-06-28 MED ORDER — SODIUM CHLORIDE 0.9 % IJ SYRG
Freq: Three times a day (TID) | INTRAMUSCULAR | Status: DC
Start: 2016-06-28 — End: 2016-06-28

## 2016-06-28 MED ORDER — METRONIDAZOLE IN SODIUM CHLORIDE (ISO-OSM) 500 MG/100 ML IV PIGGY BACK
500 mg/100 mL | Freq: Three times a day (TID) | INTRAVENOUS | Status: DC
Start: 2016-06-28 — End: 2016-06-28
  Administered 2016-06-28 (×2): via INTRAVENOUS

## 2016-06-28 MED ORDER — HYDROMORPHONE (PF) 2 MG/ML IJ SOLN
2 mg/mL | Freq: Three times a day (TID) | INTRAMUSCULAR | Status: DC | PRN
Start: 2016-06-28 — End: 2016-06-28

## 2016-06-28 MED ORDER — VANCOMYCIN ORAL SOLUTION 50 MG/ML CPD (RX COMPOUNDED)
50 mg/mL | Freq: Four times a day (QID) | ORAL | Status: DC
Start: 2016-06-28 — End: 2016-06-28
  Administered 2016-06-28: 19:00:00 via ORAL

## 2016-06-28 MED ORDER — ACETAMINOPHEN 325 MG TABLET
325 mg | Freq: Four times a day (QID) | ORAL | Status: DC | PRN
Start: 2016-06-28 — End: 2016-06-28

## 2016-06-28 MED ORDER — ONDANSETRON (PF) 4 MG/2 ML INJECTION
4 mg/2 mL | INTRAMUSCULAR | Status: DC | PRN
Start: 2016-06-28 — End: 2016-06-28

## 2016-06-28 MED ORDER — NITROGLYCERIN IN D5W 200 MCG/ML IV
50 mg/2 mL (200 mcg/mL) | INTRAVENOUS | Status: DC
Start: 2016-06-28 — End: 2016-06-28

## 2016-06-28 MED FILL — VANCOMYCIN ORAL SOLUTION 50 MG/ML CPD (RX COMPOUNDED): 50 mg/mL | ORAL | Qty: 5

## 2016-06-28 MED FILL — METRONIDAZOLE IN SODIUM CHLORIDE (ISO-OSM) 500 MG/100 ML IV PIGGY BACK: 500 mg/100 mL | INTRAVENOUS | Qty: 100

## 2016-06-28 MED FILL — HYDROMORPHONE 2 MG/ML INJECTION SOLUTION: 2 mg/mL | INTRAMUSCULAR | Qty: 1

## 2016-06-28 MED FILL — VANCOMYCIN 500 MG IV SOLR: 500 mg | INTRAVENOUS | Qty: 500

## 2016-06-28 MED FILL — ONDANSETRON (PF) 4 MG/2 ML INJECTION: 4 mg/2 mL | INTRAMUSCULAR | Qty: 2

## 2016-06-28 MED FILL — METOPROLOL TARTRATE 5 MG/5 ML IV SOLN: 5 mg/ mL | INTRAVENOUS | Qty: 5

## 2016-06-28 MED FILL — HYDRALAZINE 20 MG/ML IJ SOLN: 20 mg/mL | INTRAMUSCULAR | Qty: 1

## 2016-06-28 MED FILL — BD POSIFLUSH NORMAL SALINE 0.9 % INJECTION SYRINGE: INTRAMUSCULAR | Qty: 10

## 2016-06-28 MED FILL — HYDROMORPHONE (PF) 2 MG/ML IJ SOLN: 2 mg/mL | INTRAMUSCULAR | Qty: 1

## 2016-06-28 NOTE — ED Notes (Signed)
Spoke with dialysis nurse at this time.  All questions answered.

## 2016-06-28 NOTE — ED Notes (Signed)
Pt sleeping. Pt refusing vanc enema and would like it orally.  Paged Dr. Theodoro GristAVE.

## 2016-06-28 NOTE — ED Notes (Signed)
GI consult called.

## 2016-06-28 NOTE — ED Notes (Signed)
Pt given meal tray

## 2016-06-28 NOTE — ED Notes (Signed)
Pt states that he started to dry heave while eating ice chips.

## 2016-06-28 NOTE — Procedures (Signed)
[facility] [place]  [phone]    Vitals   Pre   Post   Assessment   Pre   Post     Temp  Temp: 98.3 ??F (36.8 ??C) (06/28/16 0930)  98.5 LOC  A&Ox4 same   HR   Pulse (Heart Rate): (!) 104 (06/28/16 0930) 104 Lungs   Coarse, diminished bases,  Using 2L of O2 prn same    B/P   BP: 137/67 (06/28/16 0930) 154/87 Cardiac   Telemetry, S1 S2,  HRR S1 S2, NSR    Resp   Resp Rate: 18 (06/28/16 0930) 18 Skin   CDI same   Pain level  Pain Intensity 1: 10 (06/27/16 1824) 8 Edema  1+ BLE, ascites     same   Orders:    Duration:   Start:    09:31 End:    10:30 Total:   1 hr   Dialyzer:   Dialyzer/Set Up Inspection: Revaclear (06/28/16 0930)   K Bath:   Dialysate K (mEq/L): 3 (06/28/16 0930)   Ca Bath:   Dialysate CA (mEq/L): 2.5 (06/28/16 0930)   Na/Bicarb:   Dialysate NA (mEq/L): 140 (06/28/16 0930)   Target Fluid Removal:   Goal/Amount of Fluid to Remove (mL): 3000 mL (06/28/16 0930), use sodium profile 2, linear   Access     Type & Location:   Right permcath CVC: Dressing CDI. No s/s of infection. Both lumens aspirate & flush well. Running well at BFR 450.    Labs     Obtained/Reviewed   Critical Results Called   Date when labs were drawn-  Hgb-    HGB   Date Value Ref Range Status   06/28/2016 7.3 (L) 12.1 - 17.0 g/dL Final     K-    Potassium   Date Value Ref Range Status   06/28/2016 4.5 3.5 - 5.1 mmol/L Final     Ca-   Calcium   Date Value Ref Range Status   06/28/2016 6.6 (L) 8.5 - 10.1 MG/DL Final     Bun-   BUN   Date Value Ref Range Status   06/28/2016 45 (H) 6 - 20 MG/DL Final     Creat-   Creatinine   Date Value Ref Range Status   06/28/2016 9.01 (H) 0.70 - 1.30 MG/DL Final        Medications/ Blood Products Given     Name   Dose   Route and Time     none                Blood Volume Processed (BVP):    24.1 L Net Fluid   Removed:  298 ml   Comments   Time Out Done: 08:30  Primary Nurse Rpt Pre: Elson Clan RN  Primary Nurse Rpt Post: Haskel Khan RN   Pt Education: procedural, catheter care  Care Plan: ongoing  Tx Summary:  SBAR received from Primary PM RN Elson Clan. Arrived at patient room, set up and tested machine. Spoke A&Ox4. Consent signed & on file. Patient stated he would not begin treatment until he received more pain medication and requested a sodium profile. Spoke with MD D. Patel who stated okay to use sodium profile 2 (linear).   09:24- Primary RN Haskel Khan gave 0.5 mg of dilaudid IV.  09:31- Both lumens of [place]/permcath disinfected with Alcohol per policy.  Each lumen aspirated for blood return and flushed with Normal Saline per policy. Labs drawn per  request/ order. VSS. Dialysis Tx initiated.   09:37- Primary RN started IV antibiotic 100 ml of Flagyl 500 mg. New UFR 17.1  10:00- Pt resting quietly.  10:05- Pt states he is having leg cramps, turned off UF.  10:15- Turned UF back on. Patient resting quietly.  10:26- Patient stated he was having cramps and requested to terminate treatment. Encouraged patient to finish prescribed tx and suggested he try dialysis w/o UF to eliminate cramping. Patient refused, stated he did not want to finish tx.   10:30- Tx ended. Rinsed back all possible blood. VSS. Central line catheter flushed with normal saline per policy.  Ports disinfected with Alcohol per policy and lines disconnected and capped using aseptic technique.   10:40- Central line dressing change performed using sterile technique per policy and procedure.  11:05- MD Deep Patel at bedside. Notified of early tx termination. SBAR given to Primary, RN [employer].     Admiting Diagnosis: C-difficile  Pt's previous clinic- none  Consent signed - Informed Consent Verified: Yes (06/28/16 0930)  [facility] Consent - on file  Hepatitis Status- negative 06/21/16  Machine #- Machine Number: [id number] (06/28/16 0930)  Telemetry status- NSR  Pre-dialysis wt.-

## 2016-06-28 NOTE — ED Notes (Signed)
Pt requested to give vanc enema himself, Dr, Alanson AlyAnis aware.

## 2016-06-28 NOTE — Discharge Summary (Signed)
Hospitalist Discharge Summary     Patient ID:  David [name]  [id number]  46 y.o.  06-Jul-1970    PCP on record: None    Admit date: 06/27/2016  Discharge date and time: 07/06/2016      DISCHARGE DIAGNOSIS:  See below      CONSULTATIONS:  IP CONSULT TO GASTROENTEROLOGY  IP CONSULT TO NEPHROLOGY    Excerpted HPI from H&P of David SoMushtaq Anis, MD:  David [name] is a 46 y.o.  African American male who presents with chest and abdominal pain and nausea, vomiting and diarrhea. As per patient he is been having 10/10 chest and abdominal pain which is constant and is been going on for a week. Pt also reports, fever of 101 at home associated with chills. Pt also reported recurrent c.diff and claims to have 16 episodes and claims have been tried on vanco and dificid without help. Pt reports recurrent abdominal distension requiring recurring paracentesis. Pt noted to be admitted recently for the same and signed AMA when decline IV pain meds. He also noted to have went to [name] and signed AMA when declined further IV pain meds. Pt claims to have been fired from dialysis center because of missing HD. In Ed pt noted to have high blood pressure, Anasarca on CT along with ascites and pleural effusion    ______________________________________________________________________  DISCHARGE SUMMARY/HOSPITAL COURSE:  for full details see H&P, daily progress notes, labs, consult notes.   Hypertensive Urgency, resolved  Anasarca in settings of ESRD HD  Due to Non Compliance and nausea and vomiting  Initial plan was to admit to PCU. Pt was seen while undergoing dialysis in the ED and noted to have improved in terms of BP, and symptomatolgy. Therefore placed order to transfer pt from Stepdown to Telemetry  Discontinue Nitro drip  BP to improve with dialysis  Appreciate Nephrology follow up   Pt is from [place] and was previously undergoing HD there. However  it seems that he has "been fired" from that facility and Nephrologist and pt therefore does not have any place for OP dialysis. This will need to be addressed and may be difficult due to pt's long history of noncompliance.   Nausea, vomiting, and abdominal pain, improved  Diarrhea, ongoing   In settings of recurrent C.diff   High suspicion of drug seeking behavior being recurrent ED visits and signing AMA from inpatient and ED when declined further IV pain meds  unfortunately does have organ reason to be in pain with ascites   Doubt c.diff causing pain as no colitis on CT  Pt underwent US guided removal of ascites on 3/12.   SAAG noted to be low - pt states that his liver biopsy was previously normal - though it is difficult to trust him  Current C. Diff results pending, positive from 2/28 and pt with poor compliance so likely to be positive again   GI recs appreciated - pt refusing FMT   Vancomycin changed to oral  Continue Flagyl IV  Will not be surprised if signs AMA again, currently in agreement to be treated  Repeat labs tmr AM  Trops down trending, concern for cardiac event is low  ????  ESRD (end stage renal disease) on dialysis   Nephrology has been consulted for HD by ED physician over the phone  ????  H/O noncompliance with medical treatment, presenting hazards to health   ????  Asthma   Not wheezing  PRN Nebs    Addendum  to Progress Note:  Pt signed AMA.      _______________________________________________________________________  Patient seen and examined by me on discharge day.  Pertinent Findings:  Gen:    Not in distress  Chest: Clear lungs  CVS:   Regular rhythm.  No edema  Abd:  Soft, not distended, not tender  Neuro:  Alert, oriented x3  _______________________________________________________________________  DISCHARGE MEDICATIONS:   Discharge Medication List as of 06/28/2016  5:15 PM          Total time in minutes spent coordinating this discharge (includes going  over instructions, follow-up, prescriptions, and preparing report for sign off to her PCP) :  35 minutes    Signed:  Earvin Hansen, MD

## 2016-06-28 NOTE — ED Notes (Signed)
Pt given crackers.

## 2016-06-28 NOTE — Progress Notes (Addendum)
Noticed RN note from 06/28/16 1248.  Never received page that was supposedly sent out in regards to pt refusing Vanc enema and wanting it PO.   Came to notice this after further review of the chart. Will switch to PO Vanc.

## 2016-06-28 NOTE — ED Notes (Signed)
RN unable to take report and will call back

## 2016-06-28 NOTE — ED Notes (Signed)
Dialysis RN at bedside for dialysis.

## 2016-06-28 NOTE — Other (Signed)
Short Stay Review       Pt Name:  David [name]   MR#  [id number]   CSN#   [id number]   Room and Hospital  2141/01  @ [facility]   Hospitalization date  06/27/2016  7:25 PM   Current Attending Physician  No att. providers found     A discharge order has been placed for this episode of hospital care for Mr. David [name]; since this hospital stay is less than two midnights, I reviewed David [name]'s chart.     David [name]'s healthcare insurance/benefit include:  [place]or: [place] MED[place]ARE / Plan: VA MEDICARE PART A & B / Product Type: Medicare /     Utilization Review related clinical summary:   The pt was diagnosed with C Diff infection.   He left AMA     On the basis of chart review, this patient's hospitalization status      is appropriate for INPATIENT           Gabriel CarinaHasan M Tanis Burnley MD MPH FACP   Physician Advisor    [facility]   [place] [facility]  [place] [facility]     [facility]  [facility]   Utilization Review, Care Management         CSN:  [id number]   HAR:   [id number]  Admitted on :  06/27/2016   Discharge order  06/28/2016

## 2016-06-28 NOTE — Progress Notes (Signed)
Hospitalist Progress Note  NAME: David [name]   DOB:  [birth year]   MRN:  [id number]       Assessment / Plan:  Hypertensive Urgency, resolved  Anasarca in settings of ESRD HD  Due to Non Compliance and nausea and vomiting  Initial plan was to admit to PCU. Pt was seen while undergoing dialysis in the ED and noted to have improved in terms of BP, and symptomatolgy. Therefore placed order to transfer pt from Stepdown to Telemetry  Discontinue Nitro drip  BP to improve with dialysis  Appreciate Nephrology follow up   Pt is from [place] and was previously undergoing HD there. However it seems that he has "been fired" from that facility and Nephrologist and pt therefore does not have any place for OP dialysis. This will need to be addressed and may be difficult due to pt's long history of noncompliance.    Nausea, vomiting, and abdominal pain, improved  Diarrhea, ongoing   In settings of recurrent C.diff   High suspicion of drug seeking behavior being recurrent ED visits and signing AMA from inpatient and ED when declined further IV pain meds  unfortunately does have organ reason to be in pain with ascites   Doubt c.diff causing pain as no colitis on CT  Pt underwent US guided removal of ascites on 3/12.   SAAG noted to be low - pt states that his liver biopsy was previously normal - though it is difficult to trust him  Current C. Diff results pending, positive from 2/28 and pt with poor compliance so likely to be positive again   GI recs appreciated - pt refusing FMT   Vancomycin changed to oral  Continue Flagyl IV  Will not be surprised if signs AMA again, currently in agreement to be treated  Repeat labs tmr AM  Trops down trending, concern for cardiac event is low  ??  ESRD (end stage renal disease) on dialysis   Nephrology has been consulted for HD by ED physician over the phone  ??  H/O noncompliance with medical treatment, presenting hazards to health   ??  Asthma   Not wheezing  PRN Nebs     Body mass index is 32.29 kg/(m^2).    Code status: Full  Prophylaxis: SCD  Recommended Disposition: Home w/Family     Subjective:     Chief Complaint / Reason for Physician Visit  "I am not feeling good man".  Discussed with RN events overnight.     Review of Systems:  Symptom Y/N Comments  Symptom Y/N Comments   Fever/Chills    Chest Pain n    Poor Appetite y   Edema     Cough    Abdominal Pain y    Sputum    Joint Pain     SOB/DOE    Pruritis/Rash     Nausea/vomit y   Tolerating PT/OT     Diarrhea y   Visual merchandiser Diet     Constipation    Other       Could NOT obtain due to:      Objective:     VITALS:   Last 24hrs VS reviewed since prior progress note. Most recent are:  Patient Vitals for the past 24 hrs:   Temp Pulse Resp BP SpO2   06/28/16 1100 - - - 163/88 100 %   06/28/16 1030 98.5 ??F (36.9 ??C) (!) 104 18 - 98 %   06/28/16 1015 - 98 18 - -  06/28/16 1000 - 97 18 167/83 98 %   06/28/16 0945 - 96 18 (!) 161/96 -   06/28/16 0930 98.3 ??F (36.8 ??C) (!) 104 18 137/67 99 %   06/28/16 0630 - - - (!) 168/103 96 %   06/28/16 0600 - - - 155/78 95 %   06/28/16 0530 - - - 143/80 94 %   06/28/16 0503 - - - - (!) 85 %   06/28/16 0502 - - - 141/87 -   06/28/16 0422 - - - - 95 %   06/28/16 0400 - - - 106/72 95 %   06/28/16 0230 - - - (!) 146/92 97 %   06/28/16 0226 - - - - 96 %   06/28/16 0130 - - - (!) 152/99 97 %   06/28/16 0104 - - - (!) 143/94 97 %   06/28/16 0052 - - - - 99 %   06/27/16 2200 - - - (!) 154/100 -   06/27/16 1824 98.5 ??F (36.9 ??C) (!) 109 16 (!) 201/110 96 %       Intake/Output Summary (Last 24 hours) at 06/28/16 1321  Last data filed at 06/28/16 1030   Gross per 24 hour   Intake                0 ml   Output              298 ml   Net             -298 ml        PHYSICAL EXAM:  General: Alert, cooperative, no acute distress????  EENT:  EOMI. Anicteric sclerae. MMM  Resp:  CTA bilaterally, no wheezing or rales.  No accessory muscle use  CV:  Regular  rhythm,?? No edema   GI:  Soft, Non distended, Non tender. ??+Bowel sounds  Neurologic:?? Alert and oriented X 3, normal speech  Psych:???? Fair insight.??Not anxious nor agitated  Skin:  No rashes.  No jaundice    Reviewed most current lab test results and cultures  YES  Reviewed most current radiology test results   YES  Review and summation of old records today    NO  Reviewed patient's current orders and MAR    YES  PMH/SH reviewed - no change compared to H&P  ________________________________________________________________________  Care Plan discussed with:    Comments   Patient x    Family      RN x    Care Manager     Consultant                        Multidiciplinary team rounds were held today with case manager, nursing, pharmacist and Higher education careers adviser.  Patient's plan of care was discussed; medications were reviewed and discharge planning was addressed.     ________________________________________________________________________  Total NON critical care TIME:  25   Minutes    Total CRITICAL CARE TIME Spent:   Minutes non procedure based      Comments   >50% of visit spent in counseling and coordination of care     ________________________________________________________________________  Earvin Hansen, MD     Procedures: see electronic medical records for all procedures/Xrays and details which were not copied into this note but were reviewed prior to creation of Plan.      LABS:  I reviewed today's most current labs and imaging studies.  Pertinent labs include:  Recent Labs  06/28/16   0349  06/27/16   1918  06/27/16   0426   WBC  4.7  5.2  5.5   HGB  7.3*  7.6*  7.8*   HCT  23.4*  25.2*  24.0*   PLT  109*  111*  119*     Recent Labs      06/28/16   0349  06/27/16   1918  06/27/16   0426   NA  140  137  137   K  4.5  4.6  4.8   CL  104  102  102   CO2  26  28  25    GLU  76  95  99   BUN  45*  42*  39*   CREA  9.01*  8.52*  7.75*   CA  6.6*  6.5*  6.7*   MG   --   1.9   --    PHOS   --   5.3*   --    ALB  2.9*  2.9*  2.7*    TBILI  0.4  0.4  0.3   SGOT  28  32  32   ALT  17  17  17        Signed: Earvin HansenSamip S Angline Schweigert, MD

## 2016-06-28 NOTE — Consults (Addendum)
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 Renal service will follow patient with you. Please contact me with any questions or concerns.    Initial Consult note         Patient name: David [name]  MR no: [id number]  Date of admission: 06/27/2016  Date of consultation: 06/28/2016  Requested by: Dr. Jamesetta SoMushtaq Anis  Reason for consult: ESRD    Patient seen and examined. History obtained from patient and chart review. Relevant labs, data and notes reviewed.      HPI: David Lindenroy D Bartko is a [age] y.o. male with PMH significant for ESRD on chronic HD but without a outpatient unit (due to medical non-compliance), Hep B, Recurrent C.Diff, HTN presenting with abd pain/diarrhea. Last HD over a week ago. Intermittent n/v. No fevers. +Mild SOB. Nephrology consulted to resume ESRD management. Seen at the end of treatment-> only ran 1hr due to persistent cramping. Encouraged to stay on but refused.    PMH:  Past Medical History:   Diagnosis Date   ??? Adverse effect of anesthesia     sleep apnea uses oxygen at night    ??? Ascites    ??? Asthma    ??? C. difficile colitis    ??? CKD (chronic kidney disease) stage V requiring chronic dialysis (HCC)    ??? HTN (hypertension)    ??? SBP (spontaneous bacterial peritonitis) (HCC)      PSH:  Past Surgical History:   Procedure Laterality Date   ??? COLONOSCOPY N/A 03/04/2016    COLONOSCOPY performed by Sandy SalaamAlex R Seamon, MD at [facility]   ??? HX HERNIA REPAIR     ??? SIGMOIDOSCOPY,DIAGNOSTIC  03/04/2016            Social history:   Social History   Substance Use Topics   ??? Smoking status: Heavy Tobacco Smoker      Packs/day: 0.50     Years: 10.00   ??? Smokeless tobacco: Never Used   ??? Alcohol use No       Family history:  Not contributory    Allergies   Allergen Reactions   ??? Lisinopril Shortness of Breath   ??? Morphine Shortness of Breath   ??? Toradol [Ketorolac] Shortness of Breath       Current Facility-Administered Medications   Medication Dose Route Frequency Last Dose   ??? sodium chloride (NS) flush 5-10 mL  5-10 mL IntraVENous Q8H     ??? sodium chloride (NS) flush 5-10 mL  5-10 mL IntraVENous PRN     ??? acetaminophen (TYLENOL) tablet 650 mg  650 mg Oral Q6H PRN     ??? ondansetron (ZOFRAN) injection 4 mg  4 mg IntraVENous Q4H PRN     ??? HYDROmorphone (DILAUDID) injection 0.5 mg  0.5 mg IntraVENous Q4H PRN 0.5 mg at 06/28/16 0924   ??? metroNIDAZOLE (FLAGYL) IVPB premix 500 mg  500 mg IntraVENous Q8H 500 mg at 06/28/16 0937   ??? vancomycin (VANCOCIN) rectal enema 500 mg  500 mg Rectal Q6H 500 mg at 06/28/16 0631   ???  metoprolol (LOPRESSOR) injection 2.5 mg  2.5 mg IntraVENous Q6H 2.5 mg at 06/28/16 0049   ??? hydrALAZINE (APRESOLINE) 20 mg/mL injection 10 mg  10 mg IntraVENous Q6H PRN     ??? nitroglycerin (Tridil) 200 mcg/ml infusion  0-200 mcg/min IntraVENous TITRATE       Current Outpatient Prescriptions   Medication Sig Dispense   ??? ondansetron (ZOFRAN ODT) 8 mg disintegrating tablet Take 1 Tab by mouth every eight (8) hours as needed for Nausea. 15 Tab   ??? albuterol (PROVENTIL HFA, VENTOLIN HFA, PROAIR HFA) 90 mcg/actuation inhaler Take 2 Puffs by inhalation every four (4) hours as needed for Wheezing. 1 Inhaler   ??? sevelamer (RENAGEL) 400 mg tablet Take 800 mg by mouth three (3) times daily (with meals).    ??? carvedilol (COREG) 12.5 mg tablet Take  by mouth two (2) times daily (with meals).    ??? hydrALAZINE (APRESOLINE) 100 mg tablet Take 100 mg by mouth three (3) times daily.    ??? amLODIPine (NORVASC) 10 mg tablet Take 10 mg by mouth daily.    ??? pantoprazole (PROTONIX) 40 mg tablet Take 40 mg by mouth daily.     ??? furosemide (LASIX) 80 mg tablet Take 80 mg by mouth daily.    ??? albuterol (PROVENTIL HFA, VENTOLIN HFA, PROAIR HFA) 90 mcg/actuation inhaler Take 2 Puffs by inhalation every four (4) hours as needed for Wheezing.        ROS (besides HPI):    General: No fever. No weight changes  ENT: No hearing loss or visual changes  Cardiovascular: No Chest pain  Pulmonary: No SOB  GI: + abdominal pain. No Nausea/Vomiting.  +Diarrhea. No blood in stool  GU: No blood in urine. No foamy or cloudy urine  Musculoskeletal: No joint swelling or redness. No morning stiffness  Endocrine: no cold or heat intolerance  Psych: denies anxiety or depression  Neuro: No light headedness or dizziness    Objective   Visit Vitals   ??? BP 167/83   ??? Pulse (!) (P) 104   ??? Temp 98.3 ??F (36.8 ??C) (Oral)   ??? Resp (P) 18   ??? Ht 6\' 1"  (1.854 m)   ??? Wt 111 kg (244 lb 11.4 oz)   ??? SpO2 98%   ??? BMI 32.29 kg/m2       Physical Exam:    Gen: NAD    HEENT: AT/NC, EOMI, moist mucous membrane, no scleral icterus    Neck: Left Permacath    Lungs/Chest wall: CTAB/L    Cardiovascular: Normal S1/S2, normal rate, regular rhythm.     Abdomen: soft, NT, ND, BS+, no HSM    Ext: no clubbing or cyanosis. No edema    Skin: warm and dry. No rashes    GU: no CVA tenderness    CNS: alert awake. Answers appropriately.     Labs/Data:    Lab Results   Component Value Date/Time    Sodium 140 06/28/2016 03:49 AM    Potassium 4.5 06/28/2016 03:49 AM    Chloride 104 06/28/2016 03:49 AM    CO2 26 06/28/2016 03:49 AM    Anion gap 10 06/28/2016 03:49 AM    Glucose 76 06/28/2016 03:49 AM    BUN 45 (H) 06/28/2016 03:49 AM    Creatinine 9.01 (H) 06/28/2016 03:49 AM    BUN/Creatinine ratio 5 (L) 06/28/2016 03:49 AM    GFR est AA 8 (L) 06/28/2016 03:49 AM    GFR est non-AA 6 (L) 06/28/2016  03:49 AM    Calcium 6.6 (L) 06/28/2016 03:49 AM       Lab Results   Component Value Date/Time    WBC 4.7 06/28/2016 03:49 AM    Hemoglobin (POC) 8.2 (L) 03/04/2016 02:04 PM     HGB 7.3 (L) 06/28/2016 03:49 AM    Hematocrit (POC) 24 (L) 03/04/2016 02:04 PM    HCT 23.4 (L) 06/28/2016 03:49 AM    PLATELET 109 (L) 06/28/2016 03:49 AM    MCV 96.7 06/28/2016 03:49 AM       Urine analysis: No results found for this or any previous visit.        No components found for: SPEP, UPEP  No results found for: PUQ, PROTU2, PROTU1, BJP1, CPE1, IMEL1, MET2  No results found for: MCACR, MCA1, MCA2, MCA3, MCAU, MCAU2, MCALPOCT      Intake/Output Summary (Last 24 hours) at 06/28/16 1120  Last data filed at 06/28/16 1030   Gross per 24 hour   Intake                0 ml   Output              298 ml   Net             -298 ml       Wt Readings from Last 3 Encounters:   06/27/16 111 kg (244 lb 11.4 oz)   06/21/16 104.6 kg (230 lb 9.6 oz)   05/23/16 103 kg (227 lb 1.2 oz)       Signed by:  Enrique Sack, MD  Nephrology and Hypertension  [facility]

## 2016-06-28 NOTE — Progress Notes (Addendum)
Pt to room from ED; refusing telemetry. "I'm not here for my heart." Pt requesting to ambulate in hallways, discussed this with Silas FloodMarsh Kemp, infection control. Per her, if pt adamant, he may do so after performing appropriate hygeine, donning clean gown/clothes, and being accompanied by staff member. Pt upset about "being babysat," but was initially agreeable to do so, then declined when male PCT arrived to accompany him.   Attending notified of tele refusal, and incidentally of BP on arrival to floor of 188/109, for which there is already a PRN hydralazine order. Will administer 10mg  and reassess. Pt presently cooperative otherwise and endorses one loose stool since arriving in room.     1648 Pt stated he required pain medication prior to any other treatment. Advised pt that PRN Dilauidid is available every 8 hours (last given at 1342). "That's doing me no good, I'm here for pain." Advised pt that he was also here for hypertension and C. Diff diarrhea. Pt stated "I'm here for pain and diarrhea. Your not doing anything until I see the doctor." Attending paged again.     1701 Per Dr. Theodoro Gristave, instruct patient he is to have his blood pressure treated. If he is not agreeable, MD aware that pt will likely sign out AMA. Pt informed that attending will not return to the floor at this time, and he is to take his BP med. Pt refused, stated, bring me the paper, I'm leaving. Pt informed that C. Diff and hypertension are potentially deadly. PIV removed, pt taken to ED lobby where his vehicle is parked via wheelchair, per his request.

## 2016-06-28 NOTE — Consults (Signed)
GI Consultation Note [name]  NAME: David [name] DOB: 1970-08-25 MRN: [id number]   ATTG: '@ATTPHYS' @ PCP: None  Date/Time:  06/28/2016 12:18 PM  Subjective:   REASON FOR CONSULT:    Recurrent C diff    David [name] is a 46 y.o.  male who I was asked to see for above. Pt has a h/o non-compliance, signing out AMA and lives in [place] with prior care at [facility] and more recently at [facility] both [facility] and [facility]. Pt was just recently d/c'ed from hospital for recurrent C diff, AP non-compliance. ID was consulted and recommended a PO Vancomycin pulse/taper. Pt says he has been compliant with this. Notably a FMT was attempted via colonoscopy in the past (11/17) but pt was found to have solid stool and thus it could not be completed.      Past Medical History:   Diagnosis Date   ??? Adverse effect of anesthesia     sleep apnea uses oxygen at night    ??? Ascites    ??? Asthma    ??? C. difficile colitis    ??? CKD (chronic kidney disease) stage V requiring chronic dialysis ([place])    ??? HTN (hypertension)    ??? SBP (spontaneous bacterial peritonitis) [facility])       Past Surgical History:   Procedure Laterality Date   ??? COLONOSCOPY N/A 03/04/2016    COLONOSCOPY performed by David Burows, MD at [facility]   ??? HX HERNIA REPAIR     ??? SIGMOIDOSCOPY,DIAGNOSTIC  03/04/2016          Social History   Substance Use Topics   ??? Smoking status: Heavy Tobacco Smoker     Packs/day: 0.50     Years: 10.00   ??? Smokeless tobacco: Never Used   ??? Alcohol use No      Family History   Problem Relation Age of Onset   ??? Hypertension Mother       Allergies   Allergen Reactions   ??? Lisinopril Shortness of Breath   ??? Morphine Shortness of Breath   ??? Toradol [Ketorolac] Shortness of Breath      Home Medications:  Prior to Admission Medications   Prescriptions Last Dose Informant Patient Reported? Taking?   albuterol (PROVENTIL HFA, VENTOLIN HFA, PROAIR HFA) 90 mcg/actuation inhaler   Yes No   Sig: Take 2 Puffs by inhalation every four (4) hours as needed for  Wheezing.   albuterol (PROVENTIL HFA, VENTOLIN HFA, PROAIR HFA) 90 mcg/actuation inhaler   No No   Sig: Take 2 Puffs by inhalation every four (4) hours as needed for Wheezing.   amLODIPine (NORVASC) 10 mg tablet   Yes No   Sig: Take 10 mg by mouth daily.   carvedilol (COREG) 12.5 mg tablet   Yes No   Sig: Take  by mouth two (2) times daily (with meals).   furosemide (LASIX) 80 mg tablet   Yes No   Sig: Take 80 mg by mouth daily.   hydrALAZINE (APRESOLINE) 100 mg tablet   Yes No   Sig: Take 100 mg by mouth three (3) times daily.   ondansetron (ZOFRAN ODT) 8 mg disintegrating tablet   No No   Sig: Take 1 Tab by mouth every eight (8) hours as needed for Nausea.   pantoprazole (PROTONIX) 40 mg tablet   Yes No   Sig: Take 40 mg by mouth daily.   sevelamer (RENAGEL) 400 mg tablet   Yes No   Sig: Take 800  mg by mouth three (3) times daily (with meals).      Facility-Administered Medications: None     Hospital medications:  Current Facility-Administered Medications   Medication Dose Route Frequency   ??? sodium chloride (NS) flush 5-10 mL  5-10 mL IntraVENous Q8H   ??? sodium chloride (NS) flush 5-10 mL  5-10 mL IntraVENous PRN   ??? acetaminophen (TYLENOL) tablet 650 mg  650 mg Oral Q6H PRN   ??? ondansetron (ZOFRAN) injection 4 mg  4 mg IntraVENous Q4H PRN   ??? HYDROmorphone (DILAUDID) injection 0.5 mg  0.5 mg IntraVENous Q4H PRN   ??? metroNIDAZOLE (FLAGYL) IVPB premix 500 mg  500 mg IntraVENous Q8H   ??? vancomycin (VANCOCIN) rectal enema 500 mg  500 mg Rectal Q6H   ??? metoprolol (LOPRESSOR) injection 2.5 mg  2.5 mg IntraVENous Q6H   ??? hydrALAZINE (APRESOLINE) 20 mg/mL injection 10 mg  10 mg IntraVENous Q6H PRN   ??? nitroglycerin (Tridil) 200 mcg/ml infusion  0-200 mcg/min IntraVENous TITRATE     Current Outpatient Prescriptions   Medication Sig   ??? ondansetron (ZOFRAN ODT) 8 mg disintegrating tablet Take 1 Tab by mouth every eight (8) hours as needed for Nausea.   ??? albuterol (PROVENTIL HFA, VENTOLIN HFA, PROAIR HFA) 90 mcg/actuation  inhaler Take 2 Puffs by inhalation every four (4) hours as needed for Wheezing.   ??? sevelamer (RENAGEL) 400 mg tablet Take 800 mg by mouth three (3) times daily (with meals).   ??? carvedilol (COREG) 12.5 mg tablet Take  by mouth two (2) times daily (with meals).   ??? hydrALAZINE (APRESOLINE) 100 mg tablet Take 100 mg by mouth three (3) times daily.   ??? amLODIPine (NORVASC) 10 mg tablet Take 10 mg by mouth daily.   ??? pantoprazole (PROTONIX) 40 mg tablet Take 40 mg by mouth daily.   ??? furosemide (LASIX) 80 mg tablet Take 80 mg by mouth daily.   ??? albuterol (PROVENTIL HFA, VENTOLIN HFA, PROAIR HFA) 90 mcg/actuation inhaler Take 2 Puffs by inhalation every four (4) hours as needed for Wheezing.     REVIEW OF SYSTEMS:     '[]'      Unable to obtain  ROS due to  '[]'     mental status change  '[]'     sedated   '[]'     intubated   '[x]'     Total of 11 systems reviewed as follows:  Const:  negative fever, negative chills, negative weight loss  Eyes:   negative diplopia or visual changes, negative eye pain  ENT:   negative coryza, negative sore throat  Resp:   negative cough, hemoptysis, dyspnea  Cards:  negative for chest pain, palpitations, lower extremity edema  GU:  negative for frequency, dysuria and hematuria  Skin:   negative for rash and pruritus  Heme:  negative for easy bruising and gum/nose bleeding  MS:  negative for myalgias, arthralgias, back pain and muscle weakness  Neurolo:  negative for headaches, dizziness, vertigo, memory problems   Psych:  negative for feelings of anxiety, depression     Pertinent Positives include :    Objective:   VITALS:    Visit Vitals   ??? BP 167/83   ??? Pulse (!) 104   ??? Temp 98.5 ??F (36.9 ??C) (Oral)   ??? Resp 18   ??? Ht '6\' 1"'  (1.854 m)   ??? Wt 111 kg (244 lb 11.4 oz)   ??? SpO2 98%   ??? BMI 32.29 kg/m2  Temp (24hrs), Avg:98.4 ??F (36.9 ??C), Min:98.3 ??F (36.8 ??C), Max:98.5 ??F (36.9 ??C)    PHYSICAL EXAM:   General:    Alert, cooperative, no distress, appears stated age.      Head:   Normocephalic, without obvious abnormality, atraumatic.  Eyes:   Conjunctivae clear, anicteric sclerae.  Pupils are equal  Nose:  Nares normal. No drainage or sinus tenderness.  Throat:    Lips, mucosa, and tongue normal.  No Thrush  Neck:  Supple, symmetrical,  no adenopathy, thyroid: non tender  Back:    Symmetric,  No CVA tenderness.  Lungs:   CTA bilaterally.  No wheezing/rhonchi/rales.  Chest wall:  No tenderness or deformity. No Accessory muscle use.  Heart:   Regular rate and rhythm,  no murmur, rub or gallop.  Abdomen:   Soft, +diffuse TTP w/o peritoneal signs. +mild distension.  Bowel sounds normal. No masses  Extremities: Atraumatic, No cyanosis.  No edema. No clubbing  Skin:     Texture, turgor normal. No rashes/lesions/jaundice  Lymph: Cervical, supraclavicular normal.  Psych:  Good insight.  Not depressed.  Not anxious or agitated.  Neurologic: EOMs intact. No facial asymmetry. No aphasia or slurred speech. Normal  strength, A/O X 3.     LAB DATA REVIEWED:    Recent Results (from the past 48 hour(s))   CBC WITH AUTOMATED DIFF    Collection Time: 06/27/16  4:26 AM   Result Value Ref Range    WBC 5.5 4.1 - 11.1 K/uL    RBC 2.47 (L) 4.10 - 5.70 M/uL    HGB 7.8 (L) 12.1 - 17.0 g/dL    HCT 24.0 (L) 36.6 - 50.3 %    MCV 97.2 80.0 - 99.0 FL    MCH 31.6 26.0 - 34.0 PG    MCHC 32.5 30.0 - 36.5 g/dL    RDW 17.3 (H) 11.5 - 14.5 %    PLATELET 119 (L) 150 - 400 K/uL    MPV 10.6 8.9 - 12.9 FL    NRBC 0.0 0 PER 100 WBC    ABSOLUTE NRBC 0.00 0.00 - 0.01 K/uL    NEUTROPHILS 78 (H) 32 - 75 %    LYMPHOCYTES 7 (L) 12 - 49 %    MONOCYTES 8 5 - 13 %    EOSINOPHILS 7 0 - 7 %    BASOPHILS 0 0 - 1 %    IMMATURE GRANULOCYTES 0 0.0 - 0.5 %    ABS. NEUTROPHILS 4.3 1.8 - 8.0 K/UL    ABS. LYMPHOCYTES 0.4 (L) 0.8 - 3.5 K/UL    ABS. MONOCYTES 0.4 0.0 - 1.0 K/UL    ABS. EOSINOPHILS 0.4 0.0 - 0.4 K/UL    ABS. BASOPHILS 0.0 0.0 - 0.1 K/UL    ABS. IMM. GRANS. 0.0 0.00 - 0.04 K/UL    DF SMEAR SCANNED      RBC COMMENTS OVALOCYTES   PRESENT        RBC COMMENTS MACROCYTOSIS  1+        RBC COMMENTS TEARDROP CELLS  PRESENT        RBC COMMENTS ANISOCYTOSIS  1+       METABOLIC PANEL, COMPREHENSIVE    Collection Time: 06/27/16  4:26 AM   Result Value Ref Range    Sodium 137 136 - 145 mmol/L    Potassium 4.8 3.5 - 5.1 mmol/L    Chloride 102 97 - 108 mmol/L    CO2 25 21 - 32 mmol/L    Anion  gap 10 5 - 15 mmol/L    Glucose 99 65 - 100 mg/dL    BUN 39 (H) 6 - 20 MG/DL    Creatinine 7.75 (H) 0.70 - 1.30 MG/DL    BUN/Creatinine ratio 5 (L) 12 - 20      GFR est AA 9 (L) >60 ml/min/1.35m    GFR est non-AA 8 (L) >60 ml/min/1.773m   Calcium 6.7 (L) 8.5 - 10.1 MG/DL    Bilirubin, total 0.3 0.2 - 1.0 MG/DL    ALT (SGPT) 17 12 - 78 U/L    AST (SGOT) 32 15 - 37 U/L    Alk. phosphatase 276 (H) 45 - 117 U/L    Protein, total 6.3 (L) 6.4 - 8.2 g/dL    Albumin 2.7 (L) 3.5 - 5.0 g/dL    Globulin 3.6 2.0 - 4.0 g/dL    A-G Ratio 0.8 (L) 1.1 - 2.2     CK W/ CKMB & INDEX    Collection Time: 06/27/16  4:26 AM   Result Value Ref Range    CK 1960 (H) 39 - 308 U/L    CK - MB 31.8 (H) <3.6 NG/ML    CK-MB Index 1.6 0 - 2.5     TROPONIN I    Collection Time: 06/27/16  4:26 AM   Result Value Ref Range    Troponin-I, Qt. 0.11 (H) <0.05 ng/mL   LIPASE    Collection Time: 06/27/16  4:26 AM   Result Value Ref Range    Lipase 297 73 - 393 U/L   AMYLASE    Collection Time: 06/27/16  4:26 AM   Result Value Ref Range    Amylase 154 (H) 25 - 115 U/L   EKG, 12 LEAD, INITIAL    Collection Time: 06/27/16  6:26 PM   Result Value Ref Range    Ventricular Rate 101 BPM    Atrial Rate 101 BPM    P-R Interval 184 ms    QRS Duration 84 ms    Q-T Interval 386 ms    QTC Calculation (Bezet) 500 ms    Calculated P Axis 80 degrees    Calculated R Axis 19 degrees    Calculated T Axis 89 degrees    Diagnosis       Sinus tachycardia  Possible Left atrial enlargement  Low voltage QRS  Cannot rule out Anterior infarct , age undetermined  Abnormal ECG  When compared with ECG of 21-May-2016 21:05,   No significant change was found     CBC WITH AUTOMATED DIFF    Collection Time: 06/27/16  7:18 PM   Result Value Ref Range    WBC 5.2 4.1 - 11.1 K/uL    RBC 2.57 (L) 4.10 - 5.70 M/uL    HGB 7.6 (L) 12.1 - 17.0 g/dL    HCT 25.2 (L) 36.6 - 50.3 %    MCV 98.1 80.0 - 99.0 FL    MCH 29.6 26.0 - 34.0 PG    MCHC 30.2 30.0 - 36.5 g/dL    RDW 17.3 (H) 11.5 - 14.5 %    PLATELET 111 (L) 150 - 400 K/uL    MPV 10.0 8.9 - 12.9 FL    NRBC 0.0 0 PER 100 WBC    ABSOLUTE NRBC 0.00 0.00 - 0.01 K/uL    NEUTROPHILS 77 (H) 32 - 75 %    LYMPHOCYTES 7 (L) 12 - 49 %    MONOCYTES 8 5 - 13 %  EOSINOPHILS 8 (H) 0 - 7 %    BASOPHILS 1 0 - 1 %    IMMATURE GRANULOCYTES 0 0.0 - 0.5 %    ABS. NEUTROPHILS 4.0 1.8 - 8.0 K/UL    ABS. LYMPHOCYTES 0.4 (L) 0.8 - 3.5 K/UL    ABS. MONOCYTES 0.4 0.0 - 1.0 K/UL    ABS. EOSINOPHILS 0.4 0.0 - 0.4 K/UL    ABS. BASOPHILS 0.0 0.0 - 0.1 K/UL    ABS. IMM. GRANS. 0.0 0.00 - 0.04 K/UL    DF AUTOMATED     METABOLIC PANEL, COMPREHENSIVE    Collection Time: 06/27/16  7:18 PM   Result Value Ref Range    Sodium 137 136 - 145 mmol/L    Potassium 4.6 3.5 - 5.1 mmol/L    Chloride 102 97 - 108 mmol/L    CO2 28 21 - 32 mmol/L    Anion gap 7 5 - 15 mmol/L    Glucose 95 65 - 100 mg/dL    BUN 42 (H) 6 - 20 MG/DL    Creatinine 8.52 (H) 0.70 - 1.30 MG/DL    BUN/Creatinine ratio 5 (L) 12 - 20      GFR est AA 8 (L) >60 ml/min/1.90m    GFR est non-AA 7 (L) >60 ml/min/1.776m   Calcium 6.5 (L) 8.5 - 10.1 MG/DL    Bilirubin, total 0.4 0.2 - 1.0 MG/DL    ALT (SGPT) 17 12 - 78 U/L    AST (SGOT) 32 15 - 37 U/L    Alk. phosphatase 272 (H) 45 - 117 U/L    Protein, total 6.5 6.4 - 8.2 g/dL    Albumin 2.9 (L) 3.5 - 5.0 g/dL    Globulin 3.6 2.0 - 4.0 g/dL    A-G Ratio 0.8 (L) 1.1 - 2.2     PHOSPHORUS    Collection Time: 06/27/16  7:18 PM   Result Value Ref Range    Phosphorus 5.3 (H) 2.6 - 4.7 MG/DL   MAGNESIUM    Collection Time: 06/27/16  7:18 PM   Result Value Ref Range    Magnesium 1.9 1.6 - 2.4 mg/dL   TROPONIN I     Collection Time: 06/28/16  3:49 AM   Result Value Ref Range    Troponin-I, Qt. 0.09 (H) <0<2.09g/mL   METABOLIC PANEL, COMPREHENSIVE    Collection Time: 06/28/16  3:49 AM   Result Value Ref Range    Sodium 140 136 - 145 mmol/L    Potassium 4.5 3.5 - 5.1 mmol/L    Chloride 104 97 - 108 mmol/L    CO2 26 21 - 32 mmol/L    Anion gap 10 5 - 15 mmol/L    Glucose 76 65 - 100 mg/dL    BUN 45 (H) 6 - 20 MG/DL    Creatinine 9.01 (H) 0.70 - 1.30 MG/DL    BUN/Creatinine ratio 5 (L) 12 - 20      GFR est AA 8 (L) >60 ml/min/1.7315m  GFR est non-AA 6 (L) >60 ml/min/1.23m101m Calcium 6.6 (L) 8.5 - 10.1 MG/DL    Bilirubin, total 0.4 0.2 - 1.0 MG/DL    ALT (SGPT) 17 12 - 78 U/L    AST (SGOT) 28 15 - 37 U/L    Alk. phosphatase 251 (H) 45 - 117 U/L    Protein, total 6.1 (L) 6.4 - 8.2 g/dL    Albumin 2.9 (L) 3.5 - 5.0 g/dL  Globulin 3.2 2.0 - 4.0 g/dL    A-G Ratio 0.9 (L) 1.1 - 2.2     CBC WITH AUTOMATED DIFF    Collection Time: 06/28/16  3:49 AM   Result Value Ref Range    WBC 4.7 4.1 - 11.1 K/uL    RBC 2.42 (L) 4.10 - 5.70 M/uL    HGB 7.3 (L) 12.1 - 17.0 g/dL    HCT 23.4 (L) 36.6 - 50.3 %    MCV 96.7 80.0 - 99.0 FL    MCH 30.2 26.0 - 34.0 PG    MCHC 31.2 30.0 - 36.5 g/dL    RDW 17.3 (H) 11.5 - 14.5 %    PLATELET 109 (L) 150 - 400 K/uL    MPV 9.8 8.9 - 12.9 FL    NRBC 0.0 0 PER 100 WBC    ABSOLUTE NRBC 0.00 0.00 - 0.01 K/uL    NEUTROPHILS 69 32 - 75 %    LYMPHOCYTES 10 (L) 12 - 49 %    MONOCYTES 9 5 - 13 %    EOSINOPHILS 11 (H) 0 - 7 %    BASOPHILS 0 0 - 1 %    IMMATURE GRANULOCYTES 0 0.0 - 0.5 %    ABS. NEUTROPHILS 3.2 1.8 - 8.0 K/UL    ABS. LYMPHOCYTES 0.5 (L) 0.8 - 3.5 K/UL    ABS. MONOCYTES 0.4 0.0 - 1.0 K/UL    ABS. EOSINOPHILS 0.5 (H) 0.0 - 0.4 K/UL    ABS. BASOPHILS 0.0 0.0 - 0.1 K/UL    ABS. IMM. GRANS. 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 Recurrent ascites NOT 2/2 portal hypertension (low SAAG ascites and prior liver bx in [name] per pt normal)  3. H/o non-compliance and repeated h/o signing out [place]  ___________________________________________________  RECOMMENDATIONS:      - agree with plan for LVP as this likely will improve pt's symptoms  - will defer to Hospitalist/Nephrologist for management of ascites as this is not 2/2 portal hypertension  - difficult to assess of pt has recurrent C diff vs colonization given his h/o non-compliance. FMT via colonoscopy was attempted in past but pt did not drink his Golytely prep. Would continue PO Vancomycin for now. I preferable option may be FMT via [place] but pt remains adamantly opposed to this    ___________________________________________________  Care Plan discussed with:    '[x]'     Patient   '[]'     Family   '[]'     Nursing   '[]'     Attending     ___________________________________________________  GI: Regenia Skeeter, MD

## 2016-06-28 NOTE — Progress Notes (Signed)
Pharmacy Clarification of Prior to Admission Medication Regimen-Follow Up Needed    The patient refused to participate in interview regarding clarification of the prior to admission medication regimen. Patient angrily stated "I was just here a week ago and nothing has changed. I don't know why yall keep asking me the same questions every time I come here'. MHT and the dialysis nurse present in the room attempted to explain its for the patient's safety to review patient medications and the patient stated "I don't care nothing has changed'.      The medication history will need to be re-evaluated at a later time during admission when patient is willing/able to participate or if more information is provided.    Thank you,  Hendricks MiloJacquline Adaijah Endres, CPhT  Medication History Pharmacy Technician

## 2016-06-28 NOTE — ED Notes (Signed)
TRANSFER - OUT REPORT:    Verbal report given to Tiara(name) on David [name]  being transferred to gen surg(unit) for routine progression of care       Report consisted of patient???s Situation, Background, Assessment and   Recommendations(SBAR).     Information from the following report(s) SBAR, Kardex, ED Summary, Procedure Summary, Intake/Output and MAR was reviewed with the receiving nurse.    Lines:   Peripheral IV 06/27/16 Right Antecubital (Active)   Site Assessment Clean, dry, & intact 06/27/2016  9:20 PM   Dressing Status Clean, dry, & intact 06/27/2016  9:20 PM   Hub Color/Line Status Pink;Flushed 06/27/2016  9:20 PM        Opportunity for questions and clarification was provided.      Patient transported with:   [employer]

## 2016-06-28 NOTE — Consults (Signed)
Consults  by Regenia Skeeter, MD at 06/28/16 1218                Author: Regenia Skeeter, MD  Service: Gastroenterology  Author Type: Physician       Filed: 06/28/16 1235  Date of Service: 06/28/16 1218  Status: Signed          Editor: Regenia Skeeter, MD (Physician)            Consult Orders        1. IP CONSULT TO GASTROENTEROLOGY [[id number]] ordered by David Spiller, MD at 06/27/16 2349                                   GI Consultation Note David [name])      NAME: David [name]  DOB: 1970/07/30 MRN:  [id number]    ATTG: '@ATTPHYS' @ PCP:  None   Date/Time:   06/28/2016 12:18 PM     Subjective:     REASON FOR CONSULT:    Recurrent C diff      David [name] is a 46 y.o.   male who I was asked to see for above. Pt has a h/o non-compliance, signing out AMA and lives in [place] with prior care at [facility] and more  recently at [facility] and [facility]. Pt was just recently d/c'ed from hospital for recurrent C diff, AP non-compliance. ID was consulted and recommended a PO Vancomycin pulse/taper. Pt says he has been compliant with this. Notably  a FMT was attempted via colonoscopy in the past (11/17) but pt was found to have solid stool and thus it could not be completed.           Past Medical History:        Diagnosis  Date         ?  Adverse effect of anesthesia            sleep apnea uses oxygen at night          ?  Ascites       ?  Asthma       ?  C. difficile colitis       ?  CKD (chronic kidney disease) stage V requiring chronic dialysis ([place])       ?  HTN (hypertension)           ?  SBP (spontaneous bacterial peritonitis) [facility])             Past Surgical History:         Procedure  Laterality  Date          ?  COLONOSCOPY  N/A  03/04/2016          COLONOSCOPY performed by Macie Burows, MD at [facility]          ?  HX HERNIA REPAIR         ?  SIGMOIDOSCOPY,DIAGNOSTIC    03/04/2016                     Social History       Substance Use Topics         ?  Smoking status:  Heavy Tobacco Smoker               Packs/day:  0.50         Years:  10.00         ?  Smokeless tobacco:  Never Used         ?  Alcohol use  No           Family History         Problem  Relation  Age of Onset          ?  Hypertension  Mother             Allergies        Allergen  Reactions         ?  Lisinopril  Shortness of Breath     ?  Morphine  Shortness of Breath         ?  Toradol [Ketorolac]  Shortness of Breath         Home Medications:     Prior to Admission Medications     Prescriptions  Last Dose  Informant  Patient Reported?  Taking?      albuterol (PROVENTIL HFA, VENTOLIN HFA, PROAIR HFA) 90 mcg/actuation inhaler      Yes  No      Sig: Take 2 Puffs by inhalation every four (4) hours as needed for Wheezing.      albuterol (PROVENTIL HFA, VENTOLIN HFA, PROAIR HFA) 90 mcg/actuation inhaler      No  No      Sig: Take 2 Puffs by inhalation every four (4) hours as needed for Wheezing.      amLODIPine (NORVASC) 10 mg tablet      Yes  No      Sig: Take 10 mg by mouth daily.      carvedilol (COREG) 12.5 mg tablet      Yes  No      Sig: Take  by mouth two (2) times daily (with meals).      furosemide (LASIX) 80 mg tablet      Yes  No      Sig: Take 80 mg by mouth daily.      hydrALAZINE (APRESOLINE) 100 mg tablet      Yes  No      Sig: Take 100 mg by mouth three (3) times daily.      ondansetron (ZOFRAN ODT) 8 mg disintegrating tablet      No  No      Sig: Take 1 Tab by mouth every eight (8) hours as needed for Nausea.      pantoprazole (PROTONIX) 40 mg tablet      Yes  No      Sig: Take 40 mg by mouth daily.      sevelamer (RENAGEL) 400 mg tablet      Yes  No      Sig: Take 800 mg by mouth three (3) times daily (with meals).               Facility-Administered Medications: None        Hospital medications:     Current Facility-Administered Medications          Medication  Dose  Route  Frequency           ?  sodium chloride (NS) flush 5-10 mL   5-10 mL  IntraVENous  Q8H     ?  sodium chloride (NS) flush 5-10 mL   5-10 mL  IntraVENous  PRN      ?  acetaminophen (TYLENOL) tablet 650 mg   650 mg  Oral  Q6H PRN     ?  ondansetron (ZOFRAN) injection 4 mg  4 mg  IntraVENous  Q4H PRN     ?  HYDROmorphone (DILAUDID) injection 0.5 mg   0.5 mg  IntraVENous  Q4H PRN     ?  metroNIDAZOLE (FLAGYL) IVPB premix 500 mg   500 mg  IntraVENous  Q8H     ?  vancomycin (VANCOCIN) rectal enema 500 mg   500 mg  Rectal  Q6H     ?  metoprolol (LOPRESSOR) injection 2.5 mg   2.5 mg  IntraVENous  Q6H           ?  hydrALAZINE (APRESOLINE) 20 mg/mL injection 10 mg   10 mg  IntraVENous  Q6H PRN           ?  nitroglycerin (Tridil) 200 mcg/ml infusion   0-200 mcg/min  IntraVENous  TITRATE          Current Outpatient Prescriptions        Medication  Sig         ?  ondansetron (ZOFRAN ODT) 8 mg disintegrating tablet  Take 1 Tab by mouth every eight (8) hours as needed for Nausea.     ?  albuterol (PROVENTIL HFA, VENTOLIN HFA, PROAIR HFA) 90 mcg/actuation inhaler  Take 2 Puffs by inhalation every four (4) hours as needed for Wheezing.     ?  sevelamer (RENAGEL) 400 mg tablet  Take 800 mg by mouth three (3) times daily (with meals).     ?  carvedilol (COREG) 12.5 mg tablet  Take  by mouth two (2) times daily (with meals).     ?  hydrALAZINE (APRESOLINE) 100 mg tablet  Take 100 mg by mouth three (3) times daily.     ?  amLODIPine (NORVASC) 10 mg tablet  Take 10 mg by mouth daily.     ?  pantoprazole (PROTONIX) 40 mg tablet  Take 40 mg by mouth daily.     ?  furosemide (LASIX) 80 mg tablet  Take 80 mg by mouth daily.         ?  albuterol (PROVENTIL HFA, VENTOLIN HFA, PROAIR HFA) 90 mcg/actuation inhaler  Take 2 Puffs by inhalation every four (4) hours as needed for Wheezing.        REVIEW OF SYSTEMS:      '[]'        Unable to obtain  ROS due to  '[]'      mental status change  '[]'      sedated   '[]'       intubated    '[x]'       Total of 11 systems reviewed as follows:   Const:  negative fever, negative chills, negative weight loss   Eyes:   negative diplopia or visual changes, negative eye pain    ENT:   negative coryza, negative sore throat   Resp:   negative cough, hemoptysis, dyspnea   Cards:  negative for chest pain, palpitations, lower extremity edema   GU:  negative for frequency, dysuria and hematuria   Skin:   negative for rash and pruritus   Heme:  negative for easy bruising and gum/nose bleeding   MS:  negative for myalgias, arthralgias, back pain and muscle weakness   Neurolo:  negative for headaches, dizziness, vertigo, memory problems    Psych:  negative for feelings of anxiety, depression       Pertinent Positives include :        Objective:     VITALS:       Visit Vitals         ?  BP  167/83     ?  Pulse  (!) 104     ?  Temp  98.5 ??F (36.9 ??C) (Oral)     ?  Resp  18     ?  Ht  '6\' 1"'  (1.854 m)     ?  Wt  111 kg (244 lb 11.4 oz)     ?  SpO2  98%         ?  BMI  32.29 kg/m2        Temp (24hrs), Avg:98.4 ??F (36.9 ??C), Min:98.3 ??F (36.8 ??C), Max:98.5 ??F (36.9 ??C)      PHYSICAL EXAM:    General:    Alert, cooperative, no distress, appears stated age.      Head:   Normocephalic, without obvious abnormality, atraumatic.   Eyes:   Conjunctivae clear, anicteric sclerae.  Pupils are equal   Nose:  Nares normal. No drainage or sinus tenderness.   Throat:    Lips, mucosa, and tongue normal.  No Thrush   Neck:  Supple, symmetrical,  no adenopathy, thyroid: non tender   Back:    Symmetric,  No CVA tenderness.   Lungs:   CTA bilaterally.  No wheezing/rhonchi/rales.   Chest wall:  No tenderness or deformity. No Accessory muscle use.   Heart:   Regular rate and rhythm,  no murmur, rub or gallop.   Abdomen:   Soft, +diffuse TTP w/o peritoneal signs. +mild distension.  Bowel sounds normal. No masses   Extremities: Atraumatic, No cyanosis.  No edema. No clubbing   Skin:     Texture, turgor normal. No rashes/lesions/jaundice   Lymph: Cervical, supraclavicular normal.   Psych:  Good insight.  Not depressed.  Not anxious or agitated.   Neurologic: EOMs intact. No facial asymmetry. No aphasia or slurred speech. Normal   strength, A/O X 3.       LAB DATA REVIEWED:       Recent Results (from the past 48 hour(s))     CBC WITH AUTOMATED DIFF          Collection Time: 06/27/16  4:26 AM         Result  Value  Ref Range            WBC  5.5  4.1 - 11.1 K/uL       RBC  2.47 (L)  4.10 - 5.70 M/uL       HGB  7.8 (L)  12.1 - 17.0 g/dL       HCT  24.0 (L)  36.6 - 50.3 %       MCV  97.2  80.0 - 99.0 FL       MCH  31.6  26.0 - 34.0 PG       MCHC  32.5  30.0 - 36.5 g/dL       RDW  17.3 (H)  11.5 - 14.5 %       PLATELET  119 (L)  150 - 400 K/uL       MPV  10.6  8.9 - 12.9 FL       NRBC  0.0  0 PER 100 WBC       ABSOLUTE NRBC  0.00  0.00 - 0.01 K/uL       NEUTROPHILS  78 (H)  32 - 75 %       LYMPHOCYTES  7 (L)  12 - 49 %       MONOCYTES  8  5 -  13 %       EOSINOPHILS  7  0 - 7 %       BASOPHILS  0  0 - 1 %       IMMATURE GRANULOCYTES  0  0.0 - 0.5 %       ABS. NEUTROPHILS  4.3  1.8 - 8.0 K/UL       ABS. LYMPHOCYTES  0.4 (L)  0.8 - 3.5 K/UL       ABS. MONOCYTES  0.4  0.0 - 1.0 K/UL       ABS. EOSINOPHILS  0.4  0.0 - 0.4 K/UL       ABS. BASOPHILS  0.0  0.0 - 0.1 K/UL       ABS. IMM. GRANS.  0.0  0.00 - 0.04 K/UL       DF  SMEAR SCANNED          RBC COMMENTS  OVALOCYTES   PRESENT             RBC COMMENTS  MACROCYTOSIS   1+                  RBC COMMENTS  TEARDROP CELLS   PRESENT                  RBC COMMENTS  ANISOCYTOSIS   1+             METABOLIC PANEL, COMPREHENSIVE          Collection Time: 06/27/16  4:26 AM         Result  Value  Ref Range            Sodium  137  136 - 145 mmol/L       Potassium  4.8  3.5 - 5.1 mmol/L       Chloride  102  97 - 108 mmol/L       CO2  25  21 - 32 mmol/L       Anion gap  10  5 - 15 mmol/L       Glucose  99  65 - 100 mg/dL       BUN  39 (H)  6 - 20 MG/DL       Creatinine  7.75 (H)  0.70 - 1.30 MG/DL       BUN/Creatinine ratio  5 (L)  12 - 20         GFR est AA  9 (L)  >60 ml/min/1.64m       GFR est non-AA  8 (L)  >60 ml/min/1.72m      Calcium  6.7 (L)  8.5 - 10.1 MG/DL       Bilirubin, total  0.3  0.2 - 1.0 MG/DL        ALT (SGPT)  17  12 - 78 U/L       AST (SGOT)  32  15 - 37 U/L       Alk. phosphatase  276 (H)  45 - 117 U/L       Protein, total  6.3 (L)  6.4 - 8.2 g/dL       Albumin  2.7 (L)  3.5 - 5.0 g/dL       Globulin  3.6  2.0 - 4.0 g/dL       A-G Ratio  0.8 (L)  1.1 - 2.2         CK W/ CKMB & INDEX  Collection Time: 06/27/16  4:26 AM         Result  Value  Ref Range            CK  1960 (H)  39 - 308 U/L       CK - MB  31.8 (H)  <3.6 NG/ML       CK-MB Index  1.6  0 - 2.5         TROPONIN I          Collection Time: 06/27/16  4:26 AM         Result  Value  Ref Range            Troponin-I, Qt.  0.11 (H)  <0.05 ng/mL       LIPASE          Collection Time: 06/27/16  4:26 AM         Result  Value  Ref Range            Lipase  297  73 - 393 U/L       AMYLASE          Collection Time: 06/27/16  4:26 AM         Result  Value  Ref Range            Amylase  154 (H)  25 - 115 U/L       EKG, 12 LEAD, INITIAL          Collection Time: 06/27/16  6:26 PM         Result  Value  Ref Range            Ventricular Rate  101  BPM       Atrial Rate  101  BPM       P-R Interval  184  ms       QRS Duration  84  ms       Q-T Interval  386  ms       QTC Calculation (Bezet)  500  ms       Calculated P Axis  80  degrees       Calculated R Axis  19  degrees            Calculated T Axis  89  degrees            Diagnosis                 Sinus tachycardia   Possible Left atrial enlargement   Low voltage QRS   Cannot rule out Anterior infarct , age undetermined   Abnormal ECG   When compared with ECG of 21-May-2016 21:05,   No significant change was found          CBC WITH AUTOMATED DIFF          Collection Time: 06/27/16  7:18 PM         Result  Value  Ref Range            WBC  5.2  4.1 - 11.1 K/uL       RBC  2.57 (L)  4.10 - 5.70 M/uL       HGB  7.6 (L)  12.1 - 17.0 g/dL       HCT  25.2 (L)  36.6 - 50.3 %       MCV  98.1  80.0 - 99.0 FL       MCH  29.6  26.0 - 34.0 PG       MCHC  30.2  30.0 - 36.5 g/dL       RDW  17.3 (H)  11.5 - 14.5 %        PLATELET  111 (L)  150 - 400 K/uL       MPV  10.0  8.9 - 12.9 FL       NRBC  0.0  0 PER 100 WBC       ABSOLUTE NRBC  0.00  0.00 - 0.01 K/uL       NEUTROPHILS  77 (H)  32 - 75 %       LYMPHOCYTES  7 (L)  12 - 49 %       MONOCYTES  8  5 - 13 %       EOSINOPHILS  8 (H)  0 - 7 %       BASOPHILS  1  0 - 1 %       IMMATURE GRANULOCYTES  0  0.0 - 0.5 %       ABS. NEUTROPHILS  4.0  1.8 - 8.0 K/UL       ABS. LYMPHOCYTES  0.4 (L)  0.8 - 3.5 K/UL       ABS. MONOCYTES  0.4  0.0 - 1.0 K/UL       ABS. EOSINOPHILS  0.4  0.0 - 0.4 K/UL       ABS. BASOPHILS  0.0  0.0 - 0.1 K/UL       ABS. IMM. GRANS.  0.0  0.00 - 0.04 K/UL       DF  AUTOMATED          METABOLIC PANEL, COMPREHENSIVE          Collection Time: 06/27/16  7:18 PM         Result  Value  Ref Range            Sodium  137  136 - 145 mmol/L       Potassium  4.6  3.5 - 5.1 mmol/L       Chloride  102  97 - 108 mmol/L       CO2  28  21 - 32 mmol/L       Anion gap  7  5 - 15 mmol/L       Glucose  95  65 - 100 mg/dL       BUN  42 (H)  6 - 20 MG/DL       Creatinine  8.52 (H)  0.70 - 1.30 MG/DL       BUN/Creatinine ratio  5 (L)  12 - 20         GFR est AA  8 (L)  >60 ml/min/1.58m       GFR est non-AA  7 (L)  >60 ml/min/1.71m      Calcium  6.5 (L)  8.5 - 10.1 MG/DL       Bilirubin, total  0.4  0.2 - 1.0 MG/DL       ALT (SGPT)  17  12 - 78 U/L       AST (SGOT)  32  15 - 37 U/L       Alk. phosphatase  272 (H)  45 - 117 U/L       Protein, total  6.5  6.4 - 8.2 g/dL       Albumin  2.9 (L)  3.5 - 5.0 g/dL  Globulin  3.6  2.0 - 4.0 g/dL       A-G Ratio  0.8 (L)  1.1 - 2.2         PHOSPHORUS          Collection Time: 06/27/16  7:18 PM         Result  Value  Ref Range            Phosphorus  5.3 (H)  2.6 - 4.7 MG/DL       MAGNESIUM          Collection Time: 06/27/16  7:18 PM         Result  Value  Ref Range            Magnesium  1.9  1.6 - 2.4 mg/dL       TROPONIN I          Collection Time: 06/28/16  3:49 AM         Result  Value  Ref Range            Troponin-I, Qt.  0.09 (H)  <0.05  ng/mL       METABOLIC PANEL, COMPREHENSIVE          Collection Time: 06/28/16  3:49 AM         Result  Value  Ref Range            Sodium  140  136 - 145 mmol/L       Potassium  4.5  3.5 - 5.1 mmol/L       Chloride  104  97 - 108 mmol/L       CO2  26  21 - 32 mmol/L       Anion gap  10  5 - 15 mmol/L       Glucose  76  65 - 100 mg/dL       BUN  45 (H)  6 - 20 MG/DL       Creatinine  9.01 (H)  0.70 - 1.30 MG/DL       BUN/Creatinine ratio  5 (L)  12 - 20         GFR est AA  8 (L)  >60 ml/min/1.36m       GFR est non-AA  6 (L)  >60 ml/min/1.722m      Calcium  6.6 (L)  8.5 - 10.1 MG/DL       Bilirubin, total  0.4  0.2 - 1.0 MG/DL       ALT (SGPT)  17  12 - 78 U/L       AST (SGOT)  28  15 - 37 U/L       Alk. phosphatase  251 (H)  45 - 117 U/L       Protein, total  6.1 (L)  6.4 - 8.2 g/dL       Albumin  2.9 (L)  3.5 - 5.0 g/dL       Globulin  3.2  2.0 - 4.0 g/dL       A-G Ratio  0.9 (L)  1.1 - 2.2         CBC WITH AUTOMATED DIFF          Collection Time: 06/28/16  3:49 AM         Result  Value  Ref Range            WBC  4.7  4.1 - 11.1 K/uL  RBC  2.42 (L)  4.10 - 5.70 M/uL       HGB  7.3 (L)  12.1 - 17.0 g/dL       HCT  23.4 (L)  36.6 - 50.3 %       MCV  96.7  80.0 - 99.0 FL       MCH  30.2  26.0 - 34.0 PG       MCHC  31.2  30.0 - 36.5 g/dL       RDW  17.3 (H)  11.5 - 14.5 %       PLATELET  109 (L)  150 - 400 K/uL       MPV  9.8  8.9 - 12.9 FL       NRBC  0.0  0 PER 100 WBC       ABSOLUTE NRBC  0.00  0.00 - 0.01 K/uL       NEUTROPHILS  69  32 - 75 %       LYMPHOCYTES  10 (L)  12 - 49 %       MONOCYTES  9  5 - 13 %       EOSINOPHILS  11 (H)  0 - 7 %       BASOPHILS  0  0 - 1 %       IMMATURE GRANULOCYTES  0  0.0 - 0.5 %       ABS. NEUTROPHILS  3.2  1.8 - 8.0 K/UL       ABS. LYMPHOCYTES  0.5 (L)  0.8 - 3.5 K/UL       ABS. MONOCYTES  0.4  0.0 - 1.0 K/UL       ABS. EOSINOPHILS  0.5 (H)  0.0 - 0.4 K/UL       ABS. BASOPHILS  0.0  0.0 - 0.1 K/UL       ABS. IMM. GRANS.  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 Recurrent ascites NOT 2/2 portal hypertension (low SAAG ascites and prior liver bx in [name] per pt normal)   3. H/o non-compliance and repeated h/o signing out [place]   ___________________________________________________   RECOMMENDATIONS:        - agree with plan for LVP as this likely will improve pt's symptoms   - will defer to Hospitalist/Nephrologist for management of ascites as this is not 2/2 portal hypertension   - difficult to assess of pt has recurrent C diff vs colonization  given his h/o non-compliance. FMT via colonoscopy was attempted in past but pt did not drink his Golytely prep. Would continue PO Vancomycin for now. I preferable option may be FMT via [place]  but pt remains adamantly opposed to this      ___________________________________________________   Care Plan discussed with:     '[x]'       Patient   '[]'      Family   '[]'      Nursing   '[]'      Attending       ___________________________________________________   GI: Regenia Skeeter, MD

## 2016-06-28 NOTE — Consults (Signed)
Consults by Talbert ForestPatel,  Shonika Kolasinski V, MD at 06/28/16 1120                Author: Talbert ForestPatel, Latron Ribas V, MD  Service: Nephrology  Author Type: Physician       Filed: 06/28/16 1132  Date of Service: 06/28/16 1120  Status: Addendum          Editor: Talbert ForestPatel, Prescott Truex V, MD (Physician)          Related Notes: Original Note by Talbert ForestPatel, Lecia Esperanza V, MD (Physician) filed at 06/28/16 1131            Consult Orders        1. 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 Renal service will follow patient with you. Please contact me with any questions or concerns.      Initial Consult note           Patient name: David [name]   MR no: [id number]   Date of admission: 06/27/2016   Date of consultation: 06/28/2016   Requested by: Dr. Jamesetta SoMushtaq Anis   Reason for consult: ESRD      Patient seen and examined. History obtained from patient and chart review. Relevant labs, data and notes reviewed.         HPI: David Lindenroy D Mickle is a  46 y.o. male with PMH significant for ESRD on chronic HD but without a outpatient unit (due to medical non-compliance), Hep B,  Recurrent C.Diff, HTN presenting with abd pain/diarrhea. Last HD over a week ago. Intermittent n/v. No fevers. +Mild SOB. Nephrology consulted to resume ESRD management. Seen at the end of treatment-> only ran 1hr due to persistent cramping. Encouraged  to stay on but refused.      PMH:     Past Medical History:        Diagnosis  Date         ?  Adverse effect of anesthesia             sleep apnea uses oxygen at night          ?  Ascites       ?  Asthma       ?  C. difficile colitis       ?  CKD (chronic kidney disease) stage V requiring chronic dialysis (HCC)       ?  HTN (hypertension)           ?  SBP (spontaneous bacterial peritonitis) (HCC)          PSH:     Past Surgical History:         Procedure  Laterality  Date          ?  COLONOSCOPY  N/A  03/04/2016          COLONOSCOPY performed by Sandy Salaam, MD at [facility]          ?  HX HERNIA REPAIR         ?  SIGMOIDOSCOPY,DIAGNOSTIC    03/04/2016                      Social history:      Social History       Substance Use Topics         ?  Smoking status:  Heavy Tobacco Smoker              Packs/day:  0.50         Years:  10.00         ?  Smokeless tobacco:  Never Used         ?  Alcohol use  No           Family history:   Not contributory        Allergies        Allergen  Reactions         ?  Lisinopril  Shortness of Breath     ?  Morphine  Shortness of Breath         ?  Toradol [Ketorolac]  Shortness of Breath             Current Facility-Administered Medications           Medication  Dose  Route  Frequency  Last Dose            ?  sodium chloride (NS) flush 5-10 mL   5-10 mL  IntraVENous  Q8H        ?  sodium chloride (NS) flush 5-10 mL   5-10 mL  IntraVENous  PRN               ?  acetaminophen (TYLENOL) tablet 650 mg   650 mg  Oral  Q6H PRN               ?  ondansetron (ZOFRAN) injection 4 mg   4 mg  IntraVENous  Q4H PRN        ?  HYDROmorphone (DILAUDID) injection 0.5 mg   0.5 mg  IntraVENous  Q4H PRN  0.5 mg at 06/28/16 0924     ?  metroNIDAZOLE (FLAGYL) IVPB premix 500 mg   500 mg  IntraVENous  Q8H  500 mg at 06/28/16 0937     ?  vancomycin (VANCOCIN) rectal enema 500 mg   500 mg  Rectal  Q6H  500 mg at 06/28/16 0631     ?  metoprolol (LOPRESSOR) injection 2.5 mg   2.5 mg  IntraVENous  Q6H  2.5 mg at 06/28/16 0049     ?  hydrALAZINE (APRESOLINE) 20 mg/mL injection 10 mg   10 mg  IntraVENous  Q6H PRN               ?   nitroglycerin (Tridil) 200 mcg/ml infusion   0-200 mcg/min  IntraVENous  TITRATE  Current Outpatient Prescriptions         Medication  Sig  Dispense          ?  ondansetron (ZOFRAN ODT) 8 mg disintegrating tablet  Take 1 Tab by mouth every eight (8) hours as needed for Nausea.  15 Tab     ?  albuterol (PROVENTIL HFA, VENTOLIN HFA, PROAIR HFA) 90 mcg/actuation inhaler  Take 2 Puffs by inhalation every four (4) hours as needed for Wheezing.  1 Inhaler     ?  sevelamer (RENAGEL) 400 mg tablet  Take 800 mg by mouth three (3) times daily (with meals).       ?  carvedilol (COREG) 12.5 mg tablet  Take  by mouth two (2) times daily (with meals).       ?  hydrALAZINE (APRESOLINE) 100 mg tablet  Take 100 mg by mouth three (3) times daily.       ?  amLODIPine (NORVASC) 10 mg tablet  Take 10 mg by mouth daily.       ?  pantoprazole (PROTONIX) 40 mg tablet  Take 40 mg by mouth daily.       ?  furosemide (LASIX) 80 mg tablet  Take 80 mg by mouth daily.            ?  albuterol (PROVENTIL HFA, VENTOLIN HFA, PROAIR HFA) 90 mcg/actuation inhaler  Take 2 Puffs by inhalation every four (4) hours as needed for Wheezing.             ROS (besides HPI):      General: No fever. No weight changes   ENT: No hearing loss or visual changes   Cardiovascular: No Chest pain   Pulmonary: No SOB   GI: + abdominal pain. No Nausea/Vomiting.  +Diarrhea. No blood in stool   GU: No blood in urine. No foamy or cloudy urine   Musculoskeletal: No joint swelling or redness. No morning stiffness   Endocrine: no cold or heat intolerance   Psych: denies anxiety or depression   Neuro: No light headedness or dizziness       Objective      Visit Vitals         ?  BP  167/83     ?  Pulse  (!) (P) 104     ?  Temp  98.3 ??F (36.8 ??C) (Oral)     ?  Resp  (P) 18     ?  Ht  6\' 1"  (1.854 m)     ?  Wt  111 kg (244 lb 11.4 oz)     ?  SpO2  98%         ?  BMI  32.29 kg/m2           Physical Exam:      Gen: NAD      HEENT: AT/NC, EOMI, moist mucous membrane, no  scleral icterus      Neck: Left Permacath      Lungs/Chest wall: CTAB/L      Cardiovascular: Normal S1/S2, normal rate, regular rhythm.       Abdomen: soft, NT, ND, BS+, no HSM      Ext: no clubbing or cyanosis. No edema      Skin: warm and dry. No rashes      GU: no CVA tenderness      CNS: alert awake. Answers appropriately.       Labs/Data:        Lab Results  Component  Value  Date/Time            Sodium  140  06/28/2016 03:49 AM       Potassium  4.5  06/28/2016 03:49 AM       Chloride  104  06/28/2016 03:49 AM       CO2  26  06/28/2016 03:49 AM       Anion gap  10  06/28/2016 03:49 AM       Glucose  76  06/28/2016 03:49 AM       BUN  45 (H)  06/28/2016 03:49 AM       Creatinine  9.01 (H)  06/28/2016 03:49 AM       BUN/Creatinine ratio  5 (L)  06/28/2016 03:49 AM       GFR est AA  8 (L)  06/28/2016 03:49 AM       GFR est non-AA  6 (L)  06/28/2016 03:49 AM            Calcium  6.6 (L)  06/28/2016 03:49 AM             Lab Results         Component  Value  Date/Time            WBC  4.7  06/28/2016 03:49 AM       Hemoglobin (POC)  8.2 (L)  03/04/2016 02:04 PM       HGB  7.3 (L)  06/28/2016 03:49 AM       Hematocrit (POC)  24 (L)  03/04/2016 02:04 PM       HCT  23.4 (L)  06/28/2016 03:49 AM       PLATELET  109 (L)  06/28/2016 03:49 AM            MCV  96.7  06/28/2016 03:49 AM           Urine analysis: No results found for this or any previous visit.            No components found for: SPEP, UPEP   No results found for: PUQ, PROTU2, PROTU1, BJP1, CPE1, IMEL1, MET2   No results found for: MCACR, MCA1, MCA2, MCA3, MCAU, MCAU2, MCALPOCT         Intake/Output Summary (Last 24 hours) at 06/28/16 1120  Last data filed at 06/28/16 1030        Gross per 24 hour     Intake                 0 ml     Output               298 ml     Net              -298 ml             Wt Readings from Last 3 Encounters:        06/27/16  111 kg (244 lb 11.4 oz)     06/21/16  104.6 kg (230 lb 9.6 oz)        05/23/16  103 kg (227 lb 1.2 oz)            Signed by:   Enrique Sack, MD   Nephrology and Hypertension   [facility]

## 2016-06-28 NOTE — Procedures (Signed)
[facility]  [phone]    Vitals   Pre   Post   Assessment   Pre   Post     Temp  Temp: 98.3 F (36.8 C) (06/28/16 0930)  98.5 LOC  A&Ox4 same   HR   Pulse (Heart Rate): (!) 104 (06/28/16 0930) 104 Lungs   Coarse, diminished bases,  Using 2L of O2 prn same    B/P   BP: 137/67 (06/28/16 0930) 154/87 Cardiac   Telemetry, S1 S2,  HRR S1 S2, NSR    Resp   Resp Rate: 18 (06/28/16 0930) 18 Skin   CDI same   Pain level  Pain Intensity 1: 10 (06/27/16 1824) 8 Edema  1+ BLE, ascites     same   Orders:    Duration:   Start:    09:31 End:    10:30 Total:   1 hr   Dialyzer:   Dialyzer/Set Up Inspection: Revaclear (06/28/16 0930)   K Bath:   Dialysate K (mEq/L): 3 (06/28/16 0930)   Ca Bath:   Dialysate CA (mEq/L): 2.5 (06/28/16 0930)   Na/Bicarb:   Dialysate NA (mEq/L): 140 (06/28/16 0930)   Target Fluid Removal:   Goal/Amount of Fluid to Remove (mL): 3000 mL (06/28/16 0930), use sodium profile 2, linear   Access     Type & Location:   Right permcath CVC: Dressing CDI. No s/s of infection. Both lumens aspirate & flush well. Running well at BFR 450.    Labs     Obtained/Reviewed   Critical Results Called   Date when labs were drawn-  Hgb-    HGB   Date Value Ref Range Status   06/28/2016 7.3 (L) 12.1 - 17.0 g/dL Final     K-    Potassium   Date Value Ref Range Status   06/28/2016 4.5 3.5 - 5.1 mmol/L Final     Ca-   Calcium   Date Value Ref Range Status   06/28/2016 6.6 (L) 8.5 - 10.1 MG/DL Final     Bun-   BUN   Date Value Ref Range Status   06/28/2016 45 (H) 6 - 20 MG/DL Final     Creat-   Creatinine   Date Value Ref Range Status   06/28/2016 9.01 (H) 0.70 - 1.30 MG/DL Final        Medications/ Blood Products Given     Name   Dose   Route and Time     none                Blood Volume Processed (BVP):    24.1 L Net Fluid   Removed:  298 ml   Comments   Time Out Done: 08:30  Primary Nurse Rpt Pre: Ivin Booty RN  Primary Nurse Rpt Post: Vonzell Schlatter RN  Pt Education: procedural,  catheter care  Care Plan: ongoing  Tx Summary:  SBAR received from Primary PM RN Ivin Booty. Arrived at patient room, set up and tested machine. Spoke A&Ox4. Consent signed & on file. Patient stated he would not begin treatment until he received more pain medication and requested a sodium profile. Spoke with MD D. Patel who stated okay to use sodium profile 2 (linear).   09:24- Primary RN Vonzell Schlatter gave 0.5 mg of dilaudid IV.  09:31- Both lumens of [place]/permcath disinfected with Alcohol per policy.  Each lumen aspirated for blood return and flushed with Normal Saline per policy. Labs drawn per  request/ order. VSS. Dialysis Tx initiated.   09:37- Primary RN started IV antibiotic 100 ml of Flagyl 500 mg. New UFR 17.1  10:00- Pt resting quietly.  10:05- Pt states he is having leg cramps, turned off UF.  10:15- Turned UF back on. Patient resting quietly.  10:26- Patient stated he was having cramps and requested to terminate treatment. Encouraged patient to finish prescribed tx and suggested he try dialysis w/o UF to eliminate cramping. Patient refused, stated he did not want to finish tx.   10:30- Tx ended. Rinsed back all possible blood. VSS. Central line catheter flushed with normal saline per policy.  Ports disinfected with Alcohol per policy and lines disconnected and capped using aseptic technique.   10:40- Central line dressing change performed using sterile technique per policy and procedure.  11:05- MD Deep Patel at bedside. Notified of early tx termination. SBAR given to Primary, RN [employer].     Admiting Diagnosis: C-difficile  Pt's previous clinic- none  Consent signed - Informed Consent Verified: Yes (06/28/16 0930)  [facility] Consent - on file  Hepatitis Status- negative 06/21/16  Machine #- Machine Number: [id number] (06/28/16 0930)  Telemetry status- NSR  Pre-dialysis wt.-

## 2016-06-28 NOTE — Discharge Summary (Signed)
Discharge Summary by Earvin Hansenave, Deosha Werden S, MD at 06/28/16 1714                Author: Earvin Hansenave, Katja Blue S, MD  Service: Internal Medicine  Author Type: Physician       Filed: 07/06/16 1419  Date of Service: 06/28/16 1714  Status: Signed          Editor: Earvin Hansenave, Alias Villagran S, MD (Physician)                                       Hospitalist Discharge Summary        Patient ID:   David [name]   [id number]   46 y.o.   21-Jun-1970      PCP on record: None      Admit date: 06/27/2016   Discharge date and time: 07/06/2016         DISCHARGE DIAGNOSIS:   See below         CONSULTATIONS:   IP CONSULT TO GASTROENTEROLOGY   IP CONSULT TO NEPHROLOGY      Excerpted HPI from H&P of Jamesetta SoMushtaq Anis, MD:   David [name] is a 46 y.o.  African American male who presents with chest and abdominal pain and nausea, vomiting and diarrhea. As per patient he is been having 10/10 chest  and abdominal pain which is constant and is been going on for a week. Pt also reports, fever of 101 at home associated with chills. Pt also reported recurrent c.diff and claims to have 16 episodes and claims have been tried on vanco and dificid without  help. Pt reports recurrent abdominal distension requiring recurring paracentesis. Pt noted to be admitted recently for the same and signed AMA when decline IV pain meds. He also noted to have went to [facility] ED and signed AMA when declined further IV  pain meds. Pt claims to have been fired from dialysis center because of missing HD. In Ed pt noted to have high blood pressure, Anasarca on CT along with ascites and pleural effusion      ______________________________________________________________________   DISCHARGE SUMMARY/HOSPITAL COURSE:   for full details see H&P, daily progress notes, labs, consult notes.       Hypertensive Urgency, resolved   Anasarca in settings of ESRD HD   Due to Non Compliance and nausea and vomiting   Initial plan was to admit to PCU. Pt was seen while undergoing dialysis in the ED and noted to have  improved in terms of BP, and symptomatolgy. Therefore placed order to transfer pt from Stepdown to Telemetry   Discontinue Nitro drip   BP to improve with dialysis   Appreciate Nephrology follow up    Pt is from [place] and was previously undergoing HD there. However it seems that he has "been fired" from that facility and Nephrologist and pt therefore does not have any place for OP dialysis. This will need to be addressed and may be difficult  due to pt's long history of noncompliance.    Nausea, vomiting, and abdominal pain, improved  Diarrhea, ongoing    In settings of recurrent C.diff    High suspicion of drug seeking behavior being recurrent ED visits and signing AMA from inpatient and ED when declined further IV pain meds   unfortunately does have organ reason to be in pain with ascites    Doubt c.diff causing pain as no colitis on  CT   Pt underwent US guided removal of ascites on 3/12.   SAAG noted to be low - pt states that his liver biopsy was previously normal - though it is difficult to trust him  Current C. Diff results pending, positive from 2/28 and pt with poor compliance  so likely to be positive again    GI recs appreciated - pt refusing FMT    Vancomycin changed to oral  Continue Flagyl IV   Will not be surprised if signs AMA again, currently in agreement to be treated  Repeat labs tmr AM  Trops down trending, concern for cardiac event is low   ????   ESRD (end stage renal disease) on dialysis    Nephrology has been consulted for HD by ED physician over the phone   ????   H/O noncompliance with medical treatment, presenting hazards to health    ????   Asthma    Not wheezing   PRN Nebs    Addendum to Progress Note:  Pt signed AMA.         _______________________________________________________________________   Patient seen and examined by me on discharge day.   Pertinent Findings:   Gen:    Not in distress   Chest: Clear lungs   CVS:   Regular rhythm.  No edema   Abd:  Soft, not distended, not  tender   Neuro:  Alert, oriented x3   _______________________________________________________________________   DISCHARGE MEDICATIONS:      Discharge Medication List as of 06/28/2016  5:15 PM                  Total time in minutes spent coordinating this discharge (includes going over instructions, follow-up, prescriptions, and preparing report for sign off to her PCP) :  35 minutes      Signed:   Earvin Hansen, MD

## 2016-07-04 NOTE — Discharge Summary (Signed)
This is a discharge AGAINST MEDICAL ADVICE.  This is a same-day discharge please refer to my history of present illness and only a few hours ago.    Briefly this is a 46 year old male with history of chronic systolic congestive heart failure and stage renal disease on hemodialysis medical noncompliance who presented with abdominal pain and diarrhea.  Patient was admitted for observation stools were sent out to rule C. difficile. Patient appeared comfortably sitting in the bed. He on admission asked for  IV Dilaudid.   Patient was informed that there is no medical reason to dispense IV narcotics at this point in time. On hearing this patient signed out AMA papers and left the hospital.

## 2016-07-13 ENCOUNTER — Inpatient Hospital Stay
Admit: 2016-07-13 | Discharge: 2016-07-14 | Discharge status: Against Medical Advice | Payer: MEDICARE | Attending: Internal Medicine | Admitting: Internal Medicine

## 2016-07-13 DIAGNOSIS — I120 Hypertensive chronic kidney disease with stage 5 chronic kidney disease or end stage renal disease: Principal | ICD-10-CM

## 2016-07-13 MED ORDER — VANCOMYCIN 1,000 MG IV SOLR
1000 mg | INTRAVENOUS | Status: DC
Start: 2016-07-13 — End: 2016-07-13
  Administered 2016-07-13: via INTRAVENOUS

## 2016-07-13 MED ORDER — HALOPERIDOL LACTATE 5 MG/ML IJ SOLN
5 mg/mL | INTRAMUSCULAR | Status: AC
Start: 2016-07-13 — End: 2016-07-13
  Administered 2016-07-13: via INTRAMUSCULAR

## 2016-07-13 MED ORDER — SODIUM CHLORIDE 0.9 % IJ SYRG
INTRAMUSCULAR | Status: DC | PRN
Start: 2016-07-13 — End: 2016-07-14

## 2016-07-13 MED ORDER — LORAZEPAM 2 MG/ML IJ SOLN
2 mg/mL | INTRAMUSCULAR | Status: AC
Start: 2016-07-13 — End: 2016-07-13
  Administered 2016-07-13: via INTRAVENOUS

## 2016-07-13 MED ORDER — NIFEDIPINE ER 30 MG 24 HR TAB
30 mg | ORAL | Status: AC
Start: 2016-07-13 — End: 2016-07-13
  Administered 2016-07-14: via ORAL

## 2016-07-13 MED ORDER — ONDANSETRON (PF) 4 MG/2 ML INJECTION
4 mg/2 mL | INTRAMUSCULAR | Status: AC
Start: 2016-07-13 — End: 2016-07-13
  Administered 2016-07-13: via INTRAVENOUS

## 2016-07-13 MED ORDER — NITROGLYCERIN 0.4 MG SUBLINGUAL TAB
0.4 mg | SUBLINGUAL | Status: DC | PRN
Start: 2016-07-13 — End: 2016-07-14

## 2016-07-13 MED ORDER — DIPHENHYDRAMINE HCL 50 MG/ML IJ SOLN
50 mg/mL | INTRAMUSCULAR | Status: AC
Start: 2016-07-13 — End: 2016-07-13
  Administered 2016-07-13: via INTRAVENOUS

## 2016-07-13 MED FILL — HALOPERIDOL LACTATE 5 MG/ML IJ SOLN: 5 mg/mL | INTRAMUSCULAR | Qty: 1

## 2016-07-13 MED FILL — ONDANSETRON (PF) 4 MG/2 ML INJECTION: 4 mg/2 mL | INTRAMUSCULAR | Qty: 2

## 2016-07-13 MED FILL — LORAZEPAM 2 MG/ML IJ SOLN: 2 mg/mL | INTRAMUSCULAR | Qty: 1

## 2016-07-13 MED FILL — DIPHENHYDRAMINE HCL 50 MG/ML IJ SOLN: 50 mg/mL | INTRAMUSCULAR | Qty: 1

## 2016-07-13 NOTE — ED Notes (Signed)
TRANSFER - OUT REPORT:    Verbal report given to Maureen RalphsVivian RN on David [name]  being [facility] of care       Report consisted of patient???s Situation, Background, Assessment and   Recommendations(SBAR).     Information from the following report(s) ED Summary was reviewed with the receiving nurse.    Lines:   Peripheral IV 07/13/16 Right Other(comment) (Active)   Site Assessment Clean, dry, & intact 07/13/2016  7:19 PM   Phlebitis Assessment 0 07/13/2016  7:19 PM   Infiltration Assessment 0 07/13/2016  7:19 PM   Dressing Status Clean, dry, & intact 07/13/2016  7:19 PM   Dressing Type Transparent 07/13/2016  7:19 PM   Hub Color/Line Status Blue 07/13/2016  7:19 PM   Alcohol Cap Used Yes 07/13/2016  7:19 PM        Opportunity for questions and clarification was provided.      Patient transported with:   Registered Nurse

## 2016-07-13 NOTE — ED Provider Notes (Signed)
HPI Comments: David [name] is a 46 y.o. male with a hx of CKD requiring chronic dialysis, C. Diff, HTN, asthma, SBP and ascites who presents to the ED with complaint of CP, generalized abdominal pain and diarrhea x4 days. Pt also head a headache yesterday. He reports having peritonitis for about a year and has weekly appointments to get fluid tapped from his abdominal area but the fluid always comes back; the doctors he has seen do not know where the fluid is coming from. His most recent C. Diff episode was last month and he saw a specialist for it. Pt does not have a PCP or a dialysis center that he can go to. He originally planned to find a center four days prior to his visit to the ED but felt ill so could not go. Pt is a smoker (0.5 ppd) who currently takes albuterol, coreg and renagel.       Patient is a 46 y.o. male presenting with chest pain, diarrhea, and abdominal pain. The history is provided by the patient.   Chest Pain (Angina)    This is a new problem. The current episode started more than 2 days ago. The problem has not changed since onset.The problem occurs constantly. Associated symptoms include abdominal pain and headaches. Pertinent negatives include no cough, no dizziness, no fever, no nausea, no numbness, no palpitations, no shortness of breath, no vomiting and no weakness.   Diarrhea    Associated symptoms include diarrhea, headaches and chest pain. Pertinent negatives include no fever, no nausea, no vomiting, no dysuria, no frequency, no arthralgias and no myalgias.   Abdominal Pain    Associated symptoms include diarrhea, headaches and chest pain. Pertinent negatives include no fever, no nausea, no vomiting, no dysuria, no frequency, no arthralgias and no myalgias.        Past Medical History:   Diagnosis Date   ??? Adverse effect of anesthesia     sleep apnea uses oxygen at night    ??? Ascites    ??? Asthma    ??? C. difficile colitis     ??? CKD (chronic kidney disease) stage V requiring chronic dialysis ([place])    ??? HTN (hypertension)    ??? SBP (spontaneous bacterial peritonitis) [facility])        Past Surgical History:   Procedure Laterality Date   ??? COLONOSCOPY N/A 03/04/2016    COLONOSCOPY performed by Macie Burows, MD at [facility]   ??? HX HERNIA REPAIR     ??? SIGMOIDOSCOPY,DIAGNOSTIC  03/04/2016              Family History:   Problem Relation Age of Onset   ??? Hypertension Mother        Social History     Social History   ??? Marital status: UNKNOWN     Spouse name: N/A   ??? Number of children: N/A   ??? Years of education: N/A     Occupational History   ??? Not on file.     Social History Main Topics   ??? Smoking status: Current Every Day Smoker     Packs/day: 0.50     Years: 10.00   ??? Smokeless tobacco: Never Used   ??? Alcohol use No   ??? Drug use: No   ??? Sexual activity: Not on file     Other Topics Concern   ??? Not on file     Social History Narrative  ALLERGIES: Lisinopril; Morphine; and Toradol [ketorolac]    Review of Systems   Constitutional: Negative.  Negative for chills and fever.   HENT: Negative.  Negative for postnasal drip, rhinorrhea and sore throat.    Eyes: Negative.  Negative for pain and discharge.   Respiratory: Negative.  Negative for cough, chest tightness and shortness of breath.    Cardiovascular: Positive for chest pain. Negative for palpitations.   Gastrointestinal: Positive for abdominal pain and diarrhea. Negative for nausea and vomiting.   Genitourinary: Negative.  Negative for dysuria, flank pain and frequency.   Musculoskeletal: Negative.  Negative for arthralgias and myalgias.   Skin: Negative.  Negative for rash.   Allergic/Immunologic: Negative.  Negative for immunocompromised state.   Neurological: Positive for headaches. Negative for dizziness, weakness, light-headedness and numbness.        Resolved, headache was only yesterday   All other systems reviewed and are negative.      Vitals:    07/13/16 1803    BP: (!) 220/126   Pulse: (!) 105   Resp: (!) 94   Temp: 98.1 ??F (36.7 ??C)   SpO2: 97%   Weight: 105.7 kg (233 lb)   Height: '6\' 1"'  (1.854 Noriko Macari)            Physical Exam   Constitutional: He is oriented to person, place, and time. He appears well-developed and well-nourished. No distress.   HENT:   Head: Normocephalic and atraumatic.   Mouth/Throat: Oropharynx is clear and moist. No oropharyngeal exudate.   Eyes: Conjunctivae and EOM are normal. Pupils are equal, round, and reactive to light. Right eye exhibits no discharge. Left eye exhibits no discharge. No scleral icterus.   Neck: Normal range of motion. Neck supple. No thyromegaly present.   Cardiovascular: Normal rate, regular rhythm, normal heart sounds and intact distal pulses.  Exam reveals no gallop and no friction rub.    No murmur heard.  Pulmonary/Chest: Breath sounds normal. No respiratory distress. He has no wheezes. He has no rales.   Abdominal: Soft. Bowel sounds are normal. He exhibits distension. There is tenderness. There is no rebound and no guarding.   Diffuse tenderness   Musculoskeletal: Normal range of motion. He exhibits edema. He exhibits no tenderness.   3+ in lower extremities   Lymphadenopathy:     He has no cervical adenopathy.   Neurological: He is alert and oriented to person, place, and time.   Skin: Skin is warm and dry. No rash noted. He is not diaphoretic.   Scars on LUQ- well healed  Abdominal area is warm   Psychiatric: He has a normal mood and affect. His behavior is normal. Judgment and thought content normal.   Nursing note and vitals reviewed.       MDM  Number of Diagnoses or Management Options  Diagnosis management comments: 46 y.o. male with a hx of CKD requiring chronic dialysis, C. Diff, HTN, asthma, SBP and ascites who presents to the ED with complaint of CP, generalized abdominal pain and diarrhea x4 days.       Plan:   Haldol  Benadryl  Labs  Troponin  Stool culture  UA    -----  7:21 PM   Documented by Mousumi S Reja, acting as a scribe for Dr. Trinidad Curet, MD.       PROVIDER ATTESTATION:  9:39 PM    The entirety of this note, signed by me, accurately reflects all works, treatments, procedures, and medical decision making performed  by me, Trinidad Curet, MD.            Patient Progress  Patient progress: stable        ED Course     Diagnostic Studies:      Lab Data:  Labs Reviewed   METABOLIC PANEL, COMPREHENSIVE - Abnormal; Notable for the following:        Result Value    Anion gap 20 (*)     BUN 46 (*)     Creatinine 10.17 (*)     BUN/Creatinine ratio 5 (*)     GFR est AA 7 (*)     GFR est non-AA 6 (*)     Calcium 7.1 (*)     Alk. phosphatase 245 (*)     Protein, total 6.2 (*)     Albumin 3.1 (*)     All other components within normal limits   CBC WITH AUTOMATED DIFF - Abnormal; Notable for the following:     RBC 3.02 (*)     HGB 8.9 (*)     HCT 28.7 (*)     MCHC 31.0 (*)     RDW 17.5 (*)     NEUTROPHILS 73 (*)     LYMPHOCYTES 11 (*)     EOSINOPHILS 8 (*)     ABS. LYMPHOCYTES 0.8 (*)     ABS. MONOCYTES 0.5 (*)     All other components within normal limits   TROPONIN I - Abnormal; Notable for the following:     Troponin-I, Qt. 0.10 (*)     All other components within normal limits   CULTURE, BLOOD   C. DIFFICILE (DNA)   CULTURE, STOOL   CULTURE, BLOOD   LIPASE   LACTIC ACID   URINALYSIS W/ RFLX MICROSCOPIC   PROTHROMBIN TIME + INR   PTT   HEPATIC FUNCTION PANEL         ED Course:   7:45 PM  I spoke with Dr. Angela Nevin who said that the pt sees physicians at [facility] in [place]. She agrees to see the pt in consult and after the labs come back, she will evaluate them. If he needs dialysis prior to the morning, I will call her back.    9:24 PM  I spoke with Dr. Legrand Como who agrees to admitting pt.             Procedures

## 2016-07-13 NOTE — H&P (Signed)
[facility]  ADMISSION NOTE H AND P    David [name], David [name]  MR#: [id number]  DOB: 1970-06-05  ACCOUNT #: [id number]   DATE OF SERVICE: 07/13/2016    PRIMARY CARE PHYSICIAN:  Not identified.      CHIEF COMPLAINT:  Nausea and vomiting.    HISTORY OF PRESENT ILLNESS:  Patient is a 46 year old man with past medical history of end-stage renal disease, on dialysis for several years, who comes in to the hospital with complaints of abdominal pain, nausea, and vomiting.  He states he has been having this now for about 4-5 days.  Also, states that he has been having some issues with a headache.  He relates that this is probably due to the fact that his blood pressure is elevated, which he says is associated with fluid overload.  He is a dialysis patient, dialyzed Monday, Wednesday and Friday and had missed his dialysis yesterday and admits that he drank a little bit more liquid than he should.  He denies any fever, chills, headaches.  He does admit to nausea and vomiting x1.  He states that it is made worse by eating and is improved when he is sitting up.  He believes that this is due to the fact that he has been missing quite a lot of dialysis recently.  He normally gets dialyzed in [place], [place], and came up to [place].  He is evasive about the reason for coming up to [facility] and alternates several different stories.  But it appears that he is here for a while and that he did not set up with dialysis unit when he came up here.  He states that the reason is that he has been so noncompliant with his dialysis treatment that he doubts that he would be accepted in a dialysis unit if he tried to transfer.  He states that he has had C. diff for a while off and on and is still having diarrhea.  He is not sure, but he believes that this is still the C. diff diarrhea and he admits that he has not completed his medicine and therapy in the past.  He denies any cough, chest pain,  shortness of breath.  Does admit that he had issues lying flat last night and had to use some pillows.  He denies any palpitation, chest tightness.  States that he has been having abdominal pain for the past 4 days, progressively, but admits that he gets this pain similar when he has missed a lot of dialysis.  The pain is about 3-5/10, gnawing in nature, not radiating, exacerbated by eating food, slightly improved after he does have a period of emesis.  Denies any coffee grounds or any bright red blood.  No hemoptysis, no hematemesis.  Denies any melena or bright red blood per rectum.  Denies any myalgias or arthralgias.    PAST MEDICAL HISTORY:  Significant for end-stage renal disease, C. diff diarrhea, multiple access failure, non-STEMI myocardial infarction in the past, gastroenteritis, hypertensive emergency, asthma.    PAST SURGICAL HISTORY:  Hernia repair and recent colonoscopy in November.    FAMILY HISTORY:  Significant for hypertension and heart disease.    SOCIAL HISTORY:  He smokes every day about half a pack a day for the past 20 years for 10 pack years.  He does not use alcohol.  Denies any illicit drug use.    ALLERGIES:  HE IS ALLERGIC TO LISINOPRIL, MORPHINE, AND TORADOL.    MEDICATIONS:  On  admission, he takes albuterol MDI, amlodipine 10 mg once a day, carvedilol 12.5 mg twice a day, Lasix 80 mg as needed, hydralazine 100 mg 3 times a day,  Zofran 8 mg as needed for nausea, Protonix 40 mg once a day, and Renagel 800 mg 3 times a day with meals.    REVIEW OF SYSTEMS:  A 10-point review of system was used to assess the patient.  All pertinent positives are in the HPI.    PHYSICAL EXAMINATION:  GENERAL:  Well-developed, well-nourished man.    NEUROLOGIC:  Alert and oriented x3.  No focal deficits.  Cranial nerves intact.  PSYCHIATRIC:  Normal affect and mood with clarity of judgment and good recall of history.  VITAL SIGNS:  T-max is 98.2, pulse is 92, respiration 14, blood pressures  201/114, O2 sats 98% on room air.  He weighs 106 kg, 233 pounds on a 6 feet 1 inch frame.  HEENT:  Normocephalic, atraumatic.  Eyes:  Extraocular muscles are intact.  Pupils are equal and reactive.  Mouth without any lesion.  NECK:  Supple, no JVD.  Signs of multiple central line insertions healed with clean scars, no open wounds.    CHEST:  An IJ catheter is in his left chest.  Exit site bandage is clean, no discharge.  RESPIRATORY:  Good breath sounds in both lungs with some rales at the bases, some crackles.  No rhonchi.  CARDIOVASCULAR:  Regular rate and rhythm with a soft systolic murmur.  No rub or gallop.  ABDOMEN:  Rounded, soft, nondistended, normal bowel sounds in all 4 quadrants.  No guarding or rebound.  MUSCULOSKELETAL:  +2 edema, no clubbing or cyanosis.  Scars of old accesses in his arm.  SKIN:  Dry, flaky with dark hard nodules scattered in various places.    LABORATORY DATA:  White count is 7.0, H and H of 8.9 and 28.7, platelets 140.  Sodium is 143, potassium 4.5, chloride 103, CO2 25, BUN 46, creatinine 10.7, glucose of 92, calcium 7.1, AST and ALT are 17 and 29, alkaline phosphatase of 245.  Lactic acid of 1.1.  Troponin 0.10.    ASSESSMENT AND PLAN:    1.  Uremia with a bit of vomiting.  This is most likely due to not the single missed dialysis last Friday but multiple missed dialysis over the course of the last several months.  Dr. Abigail Miyamoto is aware that the patient will need to get dialyzed in the morning.  Patient wishes to transfer to [facility] and needs a dialysis home.  He admits that he has got significant issues of noncompliance and is quite evident.  2.  Hypertension.  Will restart on his medication and titrate as necessary.  3.  C. 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  He was given a dose of IV vancomycin in the emergency room.  The patient had stated that he had gotten blood cultures positive for C. diff, blood cultures were drawn,  question the accuracy of the patient's recollection and will switch to p.o. vancomycin.    4.  Anemia of chronic disease.  The patient has missed so many dialysis that he is probably also missed erythropoietin and if his blood pressure was high, not eligible to get the medication.  Once blood pressure improved and on dialysis, will restart erythropoietin.  5.  Metabolic bone disease.  The patient with issues with compliance with what appears to be phosphorus, calcium crystals, manifested in dark nodules subcutaneously.  Counseled patient  on maintaining his phosphate binder.  Will check PTH and phosphorus as well as iron stores in the a.m.    6.  DVT prophylaxis.  Will use both Lovenox and SCD.    7.  All the above conditions were present on admission.      A total of 70 minutes was spent on the care and evaluation of the patient.    THE PATIENT IS FULL CODE.      Britt BologneseMIRIAM Elody Kleinsasser, MD       MM / DN  D: 07/13/2016 23:02     T: 07/13/2016 23:53  JOB #: [id number]

## 2016-07-13 NOTE — ED Triage Notes (Addendum)
Pt complaining of chest pain, abdominal pain and diarrhea for 4 days. [facility]. Pt missed dialysis yesterday. Pt has dialysis access L chest.

## 2016-07-14 LAB — CBC WITH AUTOMATED DIFF
ABS. BASOPHILS: 0 10*3/uL (ref 0.0–0.2)
ABS. EOSINOPHILS: 0.6 10*3/uL (ref 0.0–0.7)
ABS. LYMPHOCYTES: 0.8 10*3/uL — ABNORMAL LOW (ref 1.2–3.4)
ABS. MONOCYTES: 0.5 10*3/uL — ABNORMAL LOW (ref 1.1–3.2)
ABS. NEUTROPHILS: 5 10*3/uL (ref 1.4–6.5)
BASOPHILS: 1 % (ref 0–2)
EOSINOPHILS: 8 % — ABNORMAL HIGH (ref 0–5)
HCT: 28.7 % — ABNORMAL LOW (ref 36.8–45.2)
HGB: 8.9 g/dL — ABNORMAL LOW (ref 12.8–15.0)
IMMATURE GRANULOCYTES: 0 % (ref 0.0–5.0)
LYMPHOCYTES: 11 % — ABNORMAL LOW (ref 16–40)
MCH: 29.5 PG (ref 27–31)
MCHC: 31 g/dL — ABNORMAL LOW (ref 32–36)
MCV: 95 FL (ref 81–99)
MONOCYTES: 7 % (ref 0–12)
MPV: 10 FL (ref 7.4–10.4)
NEUTROPHILS: 73 % — ABNORMAL HIGH (ref 40–70)
PLATELET: 140 10*3/uL (ref 140–450)
RBC: 3.02 M/uL — ABNORMAL LOW (ref 4.0–5.2)
RDW: 17.5 % — ABNORMAL HIGH (ref 11.5–14.5)
WBC: 7 10*3/uL (ref 4.8–10.8)

## 2016-07-14 LAB — METABOLIC PANEL, COMPREHENSIVE
A-G Ratio: 1 (ref 1.0–3.1)
ALT (SGPT): 17 U/L (ref 12–78)
AST (SGOT): 29 U/L (ref 15–37)
Albumin: 3.1 g/dL — ABNORMAL LOW (ref 3.4–5.0)
Alk. phosphatase: 245 U/L — ABNORMAL HIGH (ref 46–116)
Anion gap: 20 mmol/L — ABNORMAL HIGH (ref 10–17)
BUN/Creatinine ratio: 5 — ABNORMAL LOW (ref 6.0–20.0)
BUN: 46 MG/DL — ABNORMAL HIGH (ref 7–18)
Bilirubin, total: 0.4 MG/DL (ref 0.2–1.0)
CO2: 25 mmol/L (ref 21–32)
Calcium: 7.1 MG/DL — ABNORMAL LOW (ref 8.5–10.1)
Chloride: 103 mmol/L (ref 98–107)
Creatinine: 10.17 MG/DL — CR (ref 0.6–1.3)
GFR est AA: 7 mL/min/{1.73_m2} — ABNORMAL LOW (ref >60)
GFR est non-AA: 6 mL/min/{1.73_m2} — ABNORMAL LOW (ref >60)
Globulin: 3.1 g/dL
Glucose: 92 mg/dL (ref 74–106)
Potassium: 4.5 mmol/L (ref 3.5–5.1)
Protein, total: 6.2 g/dL — ABNORMAL LOW (ref 6.4–8.2)
Sodium: 143 mmol/L (ref 136–145)

## 2016-07-14 LAB — TROPONIN I: Troponin-I, Qt.: 0.1 ng/mL — CR (ref 0.00–0.01)

## 2016-07-14 LAB — LIPASE: Lipase: 183 U/L (ref 65–230)

## 2016-07-14 LAB — LACTIC ACID: Lactic acid: 1.1 MMOL/L (ref 0.4–2.0)

## 2016-07-14 MED ORDER — VANCOMYCIN 10 GRAM IV SOLR
10 gram | Freq: Once | INTRAVENOUS | Status: AC
Start: 2016-07-14 — End: 2016-07-14
  Administered 2016-07-14: 02:00:00 via INTRAVENOUS

## 2016-07-14 MED ORDER — ACETAMINOPHEN 325 MG TABLET
325 mg | ORAL | Status: DC | PRN
Start: 2016-07-14 — End: 2016-07-14

## 2016-07-14 MED ORDER — SODIUM CHLORIDE 0.9 % IJ SYRG
INTRAMUSCULAR | Status: DC | PRN
Start: 2016-07-14 — End: 2016-07-14

## 2016-07-14 MED ORDER — CARVEDILOL 6.25 MG TAB
6.25 mg | Freq: Two times a day (BID) | ORAL | Status: DC
Start: 2016-07-14 — End: 2016-07-14

## 2016-07-14 MED ORDER — DICYCLOMINE 10 MG CAP
10 mg | Freq: Four times a day (QID) | ORAL | Status: DC | PRN
Start: 2016-07-14 — End: 2016-07-14

## 2016-07-14 MED ORDER — SODIUM CHLORIDE 0.9 % IJ SYRG
Freq: Three times a day (TID) | INTRAMUSCULAR | Status: DC
Start: 2016-07-14 — End: 2016-07-14
  Administered 2016-07-14: 04:00:00 via INTRAVENOUS

## 2016-07-14 MED ORDER — HEPARIN (PORCINE) 5,000 UNIT/ML IJ SOLN
5000 unit/mL | Freq: Three times a day (TID) | INTRAMUSCULAR | Status: DC
Start: 2016-07-14 — End: 2016-07-14
  Administered 2016-07-14: 04:00:00 via SUBCUTANEOUS

## 2016-07-14 MED ORDER — DOCUSATE SODIUM 100 MG CAP
100 mg | Freq: Two times a day (BID) | ORAL | Status: DC
Start: 2016-07-14 — End: 2016-07-14

## 2016-07-14 MED ORDER — ALBUTEROL SULFATE HFA 90 MCG/ACTUATION AEROSOL INHALER
90 mcg/actuation | RESPIRATORY_TRACT | Status: DC | PRN
Start: 2016-07-14 — End: 2016-07-13

## 2016-07-14 MED ORDER — CLONIDINE 0.1 MG TAB
0.1 mg | ORAL | Status: DC | PRN
Start: 2016-07-14 — End: 2016-07-14

## 2016-07-14 MED ORDER — NALOXONE 0.4 MG/ML INJECTION
0.4 mg/mL | INTRAMUSCULAR | Status: DC | PRN
Start: 2016-07-14 — End: 2016-07-14

## 2016-07-14 MED ORDER — HYDRALAZINE 50 MG TAB
50 mg | Freq: Three times a day (TID) | ORAL | Status: DC
Start: 2016-07-14 — End: 2016-07-14
  Administered 2016-07-14: 04:00:00 via ORAL

## 2016-07-14 MED ORDER — DIPHENHYDRAMINE HCL 50 MG/ML IJ SOLN
50 mg/mL | INTRAMUSCULAR | Status: DC | PRN
Start: 2016-07-14 — End: 2016-07-14

## 2016-07-14 MED ORDER — VORICONAZOLE 200 MG TAB
200 mg | Freq: Two times a day (BID) | ORAL | Status: DC
Start: 2016-07-14 — End: 2016-07-14

## 2016-07-14 MED ORDER — AMLODIPINE 10 MG TAB
10 mg | Freq: Every day | ORAL | Status: DC
Start: 2016-07-14 — End: 2016-07-14

## 2016-07-14 MED ORDER — ALBUTEROL SULFATE 0.083 % (0.83 MG/ML) SOLN FOR INHALATION
2.5 mg /3 mL (0.083 %) | RESPIRATORY_TRACT | Status: DC | PRN
Start: 2016-07-14 — End: 2016-07-14

## 2016-07-14 MED ORDER — PANTOPRAZOLE 40 MG TAB, DELAYED RELEASE
40 mg | Freq: Every day | ORAL | Status: DC
Start: 2016-07-14 — End: 2016-07-14

## 2016-07-14 MED ORDER — ONDANSETRON 4 MG TAB, RAPID DISSOLVE
4 mg | Freq: Three times a day (TID) | ORAL | Status: DC | PRN
Start: 2016-07-14 — End: 2016-07-14

## 2016-07-14 MED ORDER — SEVELAMER CARBONATE 800 MG TAB
800 mg | Freq: Three times a day (TID) | ORAL | Status: DC
Start: 2016-07-14 — End: 2016-07-14

## 2016-07-14 MED FILL — RENVELA 800 MG TABLET: 800 mg | ORAL | Qty: 1

## 2016-07-14 MED FILL — VORICONAZOLE 200 MG TAB: 200 mg | ORAL | Qty: 1

## 2016-07-14 MED FILL — TYLENOL 325 MG TABLET: 325 mg | ORAL | Qty: 2

## 2016-07-14 MED FILL — HEPARIN (PORCINE) 5,000 UNIT/ML IJ SOLN: 5000 unit/mL | INTRAMUSCULAR | Qty: 1

## 2016-07-14 MED FILL — VANCOMYCIN 10 GRAM IV SOLR: 10 gram | INTRAVENOUS | Qty: 1500

## 2016-07-14 MED FILL — SODIUM CHLORIDE 0.9 % IJ SYRG: INTRAMUSCULAR | Qty: 10

## 2016-07-14 MED FILL — HYDRALAZINE 50 MG TAB: 50 mg | ORAL | Qty: 2

## 2016-07-14 MED FILL — CLONIDINE 0.1 MG TAB: 0.1 mg | ORAL | Qty: 1

## 2016-07-14 NOTE — Other (Signed)
Patient signed out AMA. Dr Casimiro Needlemichael made aware.

## 2016-07-14 NOTE — Discharge Summary (Signed)
[facility]    DISCHARGE SUMMARY    Name:[name], David [name]  MR#: [id number]  DOB: 1970/07/12  ACCOUNT #: [id number]   ADMIT DATE: 07/13/2016  DISCHARGE DATE: 07/14/2016    PRIMARY CARE ON RECORD:  None.    DISCHARGE DIAGNOSES:  End-stage renal disease, noncompliance, uremia, hypertensive urgency, anasarca in the setting of end-stage renal disease, diarrhea from recurrent C. diff, drug-seeking behavior.    CONSULTATIONS:  Nephrology.    HOSPITAL COURSE:  The patient is a 46 year old African American man who comes to the hospital complaining of chest and abdominal pain along with nausea, vomiting, and diarrhea.  The patient states that he has had chest pain and abdominal pain which is constant, going on for several weeks.  Reported fever at home and states that he has had recurrent C. diff with multiple medications.  Also states that he has had abdominal distention requiring paracentesis.  He had been recently admitted for similar symptoms and had signed out AMA several times when had not received IV pain meds.  States that he has been fired from his dialysis center for missing hemodialysis and came here from [place] without setting up any dialysis center and states that he had done so because he did not think any dialysis center would take him unless he showed up at their doorstep due to his history of noncompliance.    The patient was admitted to the hospital, started on vancomycin.  Dialysis was arranged for a.m. and the patient eloped AMA when his demands for high doses of opioid pain medicines were not met.    DISCHARGE DIAGNOSES:  1.  Recurrent C. diff.  2.  Noncompliance with treatment and dialysis.  3.  Abdominal pain.  4.  Normocytic anemia.  5.  End-stage renal disease.  6.  Uremia due to missed dialysis.  7.  Drug-seeking behavior.      FOLLOWUP APPOINTMENT:  None since the patient eloped.     ADDITIONAL CARE RECOMMENDATION:  The patient should follow up with  nephrology, set up dialysis center, and return for completion of care.    DIET:  Cardiac and renal diet.    ACTIVITY:  Activity as tolerated.    DISCHARGE MEDICATIONS:  The patient was not discharged with any medications due to his elopement.      PATIENT CONDITION AT DISCHARGE:  Independent functional status with lucid cognition.  No catheter line in place.  THE PATIENT IS FULL CODE.  It is our hope that the patient continues to seek further care and/or returns back to our facility to complete his care.    Greater than 30 minutes was spent on the patient in counseling and coordination of care.    DISPOSITION:  Eloped .      Ennis Forts, MD       MM/KN  D: 08/24/2016 06:41     T: 08/24/2016 18:28  JOB #: [id number]

## 2016-07-14 NOTE — Discharge Summary (Signed)
[facility]    DISCHARGE SUMMARY    Name:David [name], David [name]  MR#: [id number]  DOB: 03-02-1971  ACCOUNT #: [id number]   ADMIT DATE: 07/13/2016  DISCHARGE DATE: 07/14/2016    PRIMARY CARE ON RECORD:  None.    DISCHARGE DIAGNOSES:  End-stage renal disease, noncompliance, uremia, hypertensive urgency, anasarca in the setting of end-stage renal disease, diarrhea from recurrent C. diff, drug-seeking behavior.    CONSULTATIONS:  Nephrology.    HOSPITAL COURSE:  The patient is a 46 year old African American man who comes to the hospital complaining of chest and abdominal pain along with nausea, vomiting, and diarrhea.  The patient states that he has had chest pain and abdominal pain which is constant, going on for several weeks.  Reported fever at home and states that he has had recurrent C. diff with multiple medications.  Also states that he has had abdominal distention requiring paracentesis.  He had been recently admitted for similar symptoms and had signed out AMA several times when had not received IV pain meds.  States that he has been fired from his dialysis center for missing hemodialysis and came here from [place] without setting up any dialysis center and states that he had done so because he did not think any dialysis center would take him unless he showed up at their doorstep due to his history of noncompliance.    The patient was admitted to the hospital, started on vancomycin.  Dialysis was arranged for a.m. and the patient eloped AMA when his demands for high doses of opioid pain medicines were not met.    DISCHARGE DIAGNOSES:  1.  Recurrent C. diff.  2.  Noncompliance with treatment and dialysis.  3.  Abdominal pain.  4.  Normocytic anemia.  5.  End-stage renal disease.  6.  Uremia due to missed dialysis.  7.  Drug-seeking behavior.      FOLLOWUP APPOINTMENT:  None since the patient eloped.     ADDITIONAL CARE RECOMMENDATION:  The patient should follow up with nephrology, set up dialysis center, and  return for completion of care.    DIET:  Cardiac and renal diet.    ACTIVITY:  Activity as tolerated.    DISCHARGE MEDICATIONS:  The patient was not discharged with any medications due to his elopement.      PATIENT CONDITION AT DISCHARGE:  Independent functional status with lucid cognition.  No catheter line in place.  THE PATIENT IS FULL CODE.  It is our hope that the patient continues to seek further care and/or returns back to our facility to complete his care.    Greater than 30 minutes was spent on the patient in counseling and coordination of care.    DISPOSITION:  Eloped .      Ennis Forts, MD       MM/KN  D: 08/24/2016 06:41     T: 08/24/2016 18:28  JOB #: [id number]

## 2016-07-15 LAB — EKG, 12 LEAD, INITIAL
Atrial Rate: 97 {beats}/min
Calculated P Axis: 57 degrees
Calculated R Axis: 132 degrees
Calculated T Axis: 50 degrees
Diagnosis: NORMAL
P-R Interval: 182 ms
Q-T Interval: 384 ms
QRS Duration: 90 ms
QTC Calculation (Bezet): 487 ms
Ventricular Rate: 97 {beats}/min

## 2016-07-15 LAB — EKG 12-LEAD
Atrial Rate: 97 {beats}/min
Diagnosis: NORMAL
P Axis: 57 degrees
P-R Interval: 182 ms
Q-T Interval: 384 ms
QRS Duration: 90 ms
QTc Calculation (Bazett): 487 ms
R Axis: 132 degrees
T Axis: 50 degrees
Ventricular Rate: 97 {beats}/min

## 2016-07-18 LAB — CULTURE, BLOOD: Culture result:: NO GROWTH

## 2016-07-21 ENCOUNTER — Inpatient Hospital Stay
Admit: 2016-07-21 | Discharge: 2016-07-21 | Discharge status: Against Medical Advice | Payer: MEDICARE | Attending: Emergency Medicine

## 2016-07-21 ENCOUNTER — Emergency Department: Admit: 2016-07-21 | Payer: MEDICARE

## 2016-07-21 ENCOUNTER — Emergency Department

## 2016-07-21 DIAGNOSIS — J810 Acute pulmonary edema: Secondary | ICD-10-CM

## 2016-07-21 LAB — POC CHEM8
Anion gap (POC): 17 mmol/L (ref 10–20)
BUN (POC): 32 mg/dL — ABNORMAL HIGH (ref 9–20)
CO2 (POC): 27 mmol/L (ref 21–32)
Calcium, ionized (POC): 0.9 mmol/L — ABNORMAL LOW (ref 1.12–1.32)
Chloride (POC): 103 mmol/L (ref 98–107)
Creatinine (POC): 7.6 mg/dL — ABNORMAL HIGH (ref 0.6–1.3)
GFRAA, POC: 9 mL/min/{1.73_m2} — ABNORMAL LOW (ref >60)
GFRNA, POC: 8 mL/min/{1.73_m2} — ABNORMAL LOW (ref >60)
Glucose (POC): 84 mg/dL (ref 65–100)
Hematocrit (POC): 24 % — ABNORMAL LOW (ref 36.6–50.3)
Potassium (POC): 4.5 mmol/L (ref 3.5–5.1)
Sodium (POC): 141 mmol/L (ref 136–145)

## 2016-07-21 LAB — EKG, 12 LEAD, INITIAL
Atrial Rate: 97 {beats}/min
Calculated P Axis: 72 degrees
Calculated R Axis: -12 degrees
Calculated T Axis: 84 degrees
Diagnosis: NORMAL
P-R Interval: 180 ms
Q-T Interval: 398 ms
QRS Duration: 88 ms
QTC Calculation (Bezet): 505 ms
Ventricular Rate: 97 {beats}/min

## 2016-07-21 LAB — CBC WITH AUTOMATED DIFF
ABS. BASOPHILS: 0.1 10*3/uL (ref 0.0–0.1)
ABS. EOSINOPHILS: 0.5 10*3/uL — ABNORMAL HIGH (ref 0.0–0.4)
ABS. IMM. GRANS.: 0 10*3/uL (ref 0.00–0.04)
ABS. LYMPHOCYTES: 0.3 10*3/uL — ABNORMAL LOW (ref 0.8–3.5)
ABS. MONOCYTES: 0.5 10*3/uL (ref 0.0–1.0)
ABS. NEUTROPHILS: 4.5 10*3/uL (ref 1.8–8.0)
ABSOLUTE NRBC: 0 10*3/uL (ref 0.00–0.01)
BASOPHILS: 1 % (ref 0–1)
EOSINOPHILS: 8 % — ABNORMAL HIGH (ref 0–7)
HCT: 26.6 % — ABNORMAL LOW (ref 36.6–50.3)
HGB: 8.2 g/dL — ABNORMAL LOW (ref 12.1–17.0)
IMMATURE GRANULOCYTES: 0 % (ref 0.0–0.5)
LYMPHOCYTES: 5 % — ABNORMAL LOW (ref 12–49)
MCH: 30 PG (ref 26.0–34.0)
MCHC: 30.8 g/dL (ref 30.0–36.5)
MCV: 97.4 FL (ref 80.0–99.0)
MONOCYTES: 8 % (ref 5–13)
MPV: 9.8 FL (ref 8.9–12.9)
NEUTROPHILS: 78 % — ABNORMAL HIGH (ref 32–75)
NRBC: 0 PER 100 WBC
PLATELET: 130 10*3/uL — ABNORMAL LOW (ref 150–400)
RBC: 2.73 M/uL — ABNORMAL LOW (ref 4.10–5.70)
RDW: 17.3 % — ABNORMAL HIGH (ref 11.5–14.5)
WBC: 5.9 10*3/uL (ref 4.1–11.1)

## 2016-07-21 LAB — CK W/ CKMB & INDEX
CK - MB: 26.1 NG/ML — ABNORMAL HIGH (ref <3.6)
CK-MB Index: 1.2 (ref 0–2.5)
CK: 2263 U/L — ABNORMAL HIGH (ref 39–308)

## 2016-07-21 LAB — METABOLIC PANEL, COMPREHENSIVE
A-G Ratio: 0.9 — ABNORMAL LOW (ref 1.1–2.2)
ALT (SGPT): 15 U/L (ref 12–78)
AST (SGOT): 33 U/L (ref 15–37)
Albumin: 3.1 g/dL — ABNORMAL LOW (ref 3.5–5.0)
Alk. phosphatase: 263 U/L — ABNORMAL HIGH (ref 45–117)
Anion gap: 12 mmol/L (ref 5–15)
BUN/Creatinine ratio: 4 — ABNORMAL LOW (ref 12–20)
BUN: 32 MG/DL — ABNORMAL HIGH (ref 6–20)
Bilirubin, total: 0.5 MG/DL (ref 0.2–1.0)
CO2: 20 mmol/L — ABNORMAL LOW (ref 21–32)
Calcium: 7.5 MG/DL — ABNORMAL LOW (ref 8.5–10.1)
Chloride: 108 mmol/L (ref 97–108)
Creatinine: 7.37 MG/DL — ABNORMAL HIGH (ref 0.70–1.30)
GFR est AA: 10 mL/min/{1.73_m2} — ABNORMAL LOW (ref >60)
GFR est non-AA: 8 mL/min/{1.73_m2} — ABNORMAL LOW (ref >60)
Globulin: 3.6 g/dL (ref 2.0–4.0)
Glucose: 82 mg/dL (ref 65–100)
Potassium: 4.7 mmol/L (ref 3.5–5.1)
Protein, total: 6.7 g/dL (ref 6.4–8.2)
Sodium: 140 mmol/L (ref 136–145)

## 2016-07-21 LAB — TROPONIN I: Troponin-I, Qt.: 0.11 ng/mL — ABNORMAL HIGH (ref <0.05)

## 2016-07-21 LAB — NT-PRO BNP: NT pro-BNP: >30000 PG/ML — ABNORMAL HIGH (ref 0–125)

## 2016-07-21 LAB — D DIMER: D-dimer: 4.28 mg/L FEU — ABNORMAL HIGH (ref 0.00–0.65)

## 2016-07-21 LAB — EKG 12-LEAD
Atrial Rate: 97 {beats}/min
Diagnosis: NORMAL
P Axis: 72 degrees
P-R Interval: 180 ms
Q-T Interval: 398 ms
QRS Duration: 88 ms
QTc Calculation (Bazett): 505 ms
R Axis: -12 degrees
T Axis: 84 degrees
Ventricular Rate: 97 {beats}/min

## 2016-07-21 LAB — D-DIMER, QUANTITATIVE: D-Dimer, Quant: 4.28 mg/L FEU — ABNORMAL HIGH (ref 0.00–0.65)

## 2016-07-21 MED ORDER — SODIUM CHLORIDE 0.9 % IV
Freq: Once | INTRAVENOUS | Status: AC
Start: 2016-07-21 — End: 2016-07-21
  Administered 2016-07-21: 13:00:00 via INTRAVENOUS

## 2016-07-21 MED ORDER — NITROGLYCERIN IN D5W 200 MCG/ML IV
50 mg/2 mL (200 mcg/mL) | INTRAVENOUS | Status: DC
Start: 2016-07-21 — End: 2016-07-21

## 2016-07-21 MED ORDER — SODIUM CHLORIDE 0.9 % IJ SYRG
Freq: Once | INTRAMUSCULAR | Status: AC
Start: 2016-07-21 — End: 2016-07-21
  Administered 2016-07-21: 13:00:00 via INTRAVENOUS

## 2016-07-21 MED ORDER — FENTANYL CITRATE (PF) 50 MCG/ML IJ SOLN
50 mcg/mL | INTRAMUSCULAR | Status: AC
Start: 2016-07-21 — End: 2016-07-21
  Administered 2016-07-21: 11:00:00 via INTRAVENOUS

## 2016-07-21 MED ORDER — IPRATROPIUM-ALBUTEROL 2.5 MG-0.5 MG/3 ML NEB SOLUTION
2.5 mg-0.5 mg/3 ml | RESPIRATORY_TRACT | Status: AC
Start: 2016-07-21 — End: 2016-07-21
  Administered 2016-07-21: 14:00:00 via RESPIRATORY_TRACT

## 2016-07-21 MED ORDER — FENTANYL CITRATE (PF) 50 MCG/ML IJ SOLN
50 mcg/mL | INTRAMUSCULAR | Status: AC
Start: 2016-07-21 — End: 2016-07-21
  Administered 2016-07-21: 14:00:00 via INTRAVENOUS

## 2016-07-21 MED ORDER — SODIUM CHLORIDE 0.9 % IJ SYRG
Freq: Three times a day (TID) | INTRAMUSCULAR | Status: DC
Start: 2016-07-21 — End: 2016-07-21
  Administered 2016-07-21: 11:00:00 via INTRAVENOUS

## 2016-07-21 MED ORDER — IOPAMIDOL 76 % IV SOLN
370 mg iodine /mL (76 %) | Freq: Once | INTRAVENOUS | Status: AC
Start: 2016-07-21 — End: 2016-07-21
  Administered 2016-07-21: 13:00:00 via INTRAVENOUS

## 2016-07-21 MED ORDER — SODIUM CHLORIDE 0.9 % IJ SYRG
INTRAMUSCULAR | Status: DC | PRN
Start: 2016-07-21 — End: 2016-07-21

## 2016-07-21 MED ORDER — ONDANSETRON (PF) 4 MG/2 ML INJECTION
4 mg/2 mL | INTRAMUSCULAR | Status: AC
Start: 2016-07-21 — End: 2016-07-21
  Administered 2016-07-21: 11:00:00 via INTRAVENOUS

## 2016-07-21 MED ORDER — VANCOMYCIN ORAL SOLUTION 50 MG/ML CPD (RX COMPOUNDED)
50 mg/mL | Freq: Four times a day (QID) | ORAL | Status: DC
Start: 2016-07-21 — End: 2016-07-21

## 2016-07-21 MED ORDER — METRONIDAZOLE IN SODIUM CHLORIDE (ISO-OSM) 500 MG/100 ML IV PIGGY BACK
500 mg/100 mL | Freq: Three times a day (TID) | INTRAVENOUS | Status: DC
Start: 2016-07-21 — End: 2016-07-21

## 2016-07-21 MED ORDER — IPRATROPIUM-ALBUTEROL 2.5 MG-0.5 MG/3 ML NEB SOLUTION
2.5 mg-0.5 mg/3 ml | Freq: Once | RESPIRATORY_TRACT | Status: AC
Start: 2016-07-21 — End: 2016-07-21
  Administered 2016-07-21: 11:00:00 via RESPIRATORY_TRACT

## 2016-07-21 MED ORDER — ONDANSETRON (PF) 4 MG/2 ML INJECTION
4 mg/2 mL | INTRAMUSCULAR | Status: DC
Start: 2016-07-21 — End: 2016-07-21

## 2016-07-21 MED FILL — FENTANYL CITRATE (PF) 50 MCG/ML IJ SOLN: 50 mcg/mL | INTRAMUSCULAR | Qty: 2

## 2016-07-21 MED FILL — IPRATROPIUM-ALBUTEROL 2.5 MG-0.5 MG/3 ML NEB SOLUTION: 2.5 mg-0.5 mg/3 ml | RESPIRATORY_TRACT | Qty: 3

## 2016-07-21 MED FILL — ISOVUE-370  76 % INTRAVENOUS SOLUTION: 370 mg iodine /mL (76 %) | INTRAVENOUS | Qty: 100

## 2016-07-21 MED FILL — VANCOMYCIN ORAL SOLUTION 50 MG/ML CPD (RX COMPOUNDED): 50 mg/mL | ORAL | Qty: 5

## 2016-07-21 MED FILL — SODIUM CHLORIDE 0.9 % IV: INTRAVENOUS | Qty: 1000

## 2016-07-21 MED FILL — ONDANSETRON (PF) 4 MG/2 ML INJECTION: 4 mg/2 mL | INTRAMUSCULAR | Qty: 2

## 2016-07-21 NOTE — ED Notes (Signed)
This RN into pt room to medicate with breathing treatment Pt noted to have turned oxygen back on to 5 LPM Pt advised that his oxygen sats are 97-100% Pt states he wears 3-4 LPM via NC "as needed" at home.     Dr Wendall Papa aware of above.

## 2016-07-21 NOTE — ED Notes (Signed)
Pt rang out Upon entry into room pt noted to be fully clothed with gown sitting next to him on stretcher Pt requesting something for pain/nausea stating "my stomach and chest are killing me." Pt placed back on BP and pulse ox. MD aware of pt request for medications.

## 2016-07-21 NOTE — ED Notes (Signed)
Pt medicated as ordered for pain/nausea.

## 2016-07-21 NOTE — ED Notes (Signed)
Pt rang out requesting breathing treatment. Dr Wendall Papa aware and orders noted.

## 2016-07-21 NOTE — Consults (Addendum)
Hospitalist Consultation NoteNAME: David [name]   DOB:  Apr 15, 1971   MRN:  [id number]     Date/Time:  07/21/2016 10:19 AM    Patient PCP: None    I have been asked to see this patient by the attending, Dr. Rennis Golden , for advice/opinion re: evaluate for admission.  My advice is:  ________________________________________________________________________   Assessment &  Plan:  SOB, CP due to  Volume overload and anasarca from missed dialysis  Noncompliance with dialysis  ESRD on HD  --CXR with mild pulmonary edema, cardiomyopathy.  To have urgent dialysis by nephrology  --has 3+ b/l leg edema, abdominal wall edema  --nephrology consulted to have urgent dialysis    N/V/abdominal pain, diarrhea  Hx recurrent C. Diff  Noncompliant with treatment, documented to have refused FMT, hx repeatedly sign out AMA repeated when IV narcotic stopped  --get stool to check for C. Diff.  Give empiric IV flagyl and oral vancomycin  --give PO challenge in ER.  If he passes oral challenge, may be discharged home on oral vancomycin after dialysis.  Will reassess patient after dialysis is done.    Chronic troponin elevation 0.11  Noncardiac chest pain  --ekg without active ischemia  --CTA chest negative for PE    Asthma on 2L O2 prn at home  Ongoing tobacco abuse  --patient advised to stop smoking.    Asked patient about his history of signing out AMA repeatedly and that there is suspicion of patient drug seeking behavior since he promptly leaves each time IV narcotic is stopped.  He reports leaving because he gets "frustrated", not because doctors stopped giving IV dilaudid.    About 15 minutes after I evaluated patient, was notified by ER nurse that patient wants to leave AMA.  Discussed with ER physician Dr. Lavone Neri.  Would try to convince patient to wait at least until after he has had dialysis.        Subjective:   CHIEF COMPLAINT:  "SOB, chest pain"    HISTORY OF PRESENT ILLNESS:      David [name] is a 46 y.o.  male with ESRD, HTN, asthma on home O2 2L prn, hx recurrent c. Diff who presents with above complaint.  Patient with multiple admissions (5 in [facility] system in past 2 months) but leaving against medical advice each time.  He reports last dialysis on 3/28.  Two days ago developed SOB, nonproductive cough, pain across chest and in abdomen with nausea and vomiting.  Not been able to take blood pressure medication since 4/2.  Also having diarrhea.  Denies fever, chills.  Report having had 2 diarrhea since being in ER.  He lives in [place] but does not follow with any dialysis doctor or dialysis center since he is not compliant because was "trying to work a full time job".    However, he is no longer working and is on disability currently.  Past Medical History:   Diagnosis Date   ??? Adverse effect of anesthesia     sleep apnea uses oxygen at night    ??? Ascites    ??? Asthma    ??? C. difficile colitis    ??? CKD (chronic kidney disease) stage V requiring chronic dialysis ([facility])    ??? HTN (hypertension)    ??? SBP (spontaneous bacterial peritonitis) [facility])       Past Surgical History:   Procedure Laterality Date   ??? COLONOSCOPY N/A 03/04/2016    COLONOSCOPY performed by Macie Burows,  MD at [facility]   ??? HX HERNIA REPAIR  2008    right inguinal and umbilical   ??? SIGMOIDOSCOPY,DIAGNOSTIC  03/04/2016          Social History   Substance Use Topics   ??? Smoking status: Current Every Day Smoker     Packs/day: 0.50     Years: 10.00   ??? Smokeless tobacco: Never Used   ??? Alcohol use No    Lives alone, on disability.  Denies illicit drug use.    Family History   Problem Relation Age of Onset   ??? Hypertension Mother      Allergies   Allergen Reactions   ??? Lisinopril Shortness of Breath   ??? Morphine Shortness of Breath   ??? Toradol [Ketorolac] Shortness of Breath        Prior to Admission medications    Medication Sig Start Date End Date Taking? Authorizing Provider    ASPIRIN/SALICYLAMIDE/CAFFEINE (BC HEADACHE POWDER PO) Take  by mouth as needed for Other (headache).   Yes Historical Provider   albuterol (PROVENTIL HFA, VENTOLIN HFA, PROAIR HFA) 90 mcg/actuation inhaler Take 2 Puffs by inhalation every four (4) hours as needed for Wheezing. 06/27/16  Yes Eula Listen, MD   sevelamer (RENAGEL) 400 mg tablet Take 800 mg by mouth three (3) times daily (with meals).   Yes Phys Other, MD   carvedilol (COREG) 12.5 mg tablet Take  by mouth two (2) times daily (with meals).   Yes Phys Other, MD   hydrALAZINE (APRESOLINE) 100 mg tablet Take 100 mg by mouth three (3) times daily.   Yes Phys Other, MD   amLODIPine (NORVASC) 10 mg tablet Take 10 mg by mouth daily.   Yes Phys Other, MD   furosemide (LASIX) 80 mg tablet Take 80 mg by mouth daily.   Yes Phys Other, MD   albuterol (PROVENTIL HFA, VENTOLIN HFA, PROAIR HFA) 90 mcg/actuation inhaler Take 2 Puffs by inhalation every four (4) hours as needed for Wheezing.   Yes Phys Other, MD     Current Facility-Administered Medications   Medication Dose Route Frequency   ??? sodium chloride (NS) flush 5-10 mL  5-10 mL IntraVENous Q8H   ??? sodium chloride (NS) flush 5-10 mL  5-10 mL IntraVENous PRN   ??? nitroglycerin (Tridil) 200 mcg/ml infusion  0-200 mcg/min IntraVENous TITRATE   ??? metroNIDAZOLE (FLAGYL) IVPB premix 500 mg  500 mg IntraVENous Q8H     Current Outpatient Prescriptions   Medication Sig   ??? ASPIRIN/SALICYLAMIDE/CAFFEINE (BC HEADACHE POWDER PO) Take  by mouth as needed for Other (headache).   ??? albuterol (PROVENTIL HFA, VENTOLIN HFA, PROAIR HFA) 90 mcg/actuation inhaler Take 2 Puffs by inhalation every four (4) hours as needed for Wheezing.   ??? sevelamer (RENAGEL) 400 mg tablet Take 800 mg by mouth three (3) times daily (with meals).   ??? carvedilol (COREG) 12.5 mg tablet Take  by mouth two (2) times daily (with meals).   ??? hydrALAZINE (APRESOLINE) 100 mg tablet Take 100 mg by mouth three (3) times daily.    ??? amLODIPine (NORVASC) 10 mg tablet Take 10 mg by mouth daily.   ??? furosemide (LASIX) 80 mg tablet Take 80 mg by mouth daily.   ??? albuterol (PROVENTIL HFA, VENTOLIN HFA, PROAIR HFA) 90 mcg/actuation inhaler Take 2 Puffs by inhalation every four (4) hours as needed for Wheezing.      REVIEW OF SYSTEMS:  BOLD = POSITIVE; negative = normal text  General:  fever, chills, sweats, weakness, weight loss/gain  Eyes: blurred vision, eye pain, loss of vision, diplopia  Ear Nose and Throat: rhinorrhea, pharyngitis, otalgia, tinnitus, speech or swallowing difficulties  Respiratory: cough, sputum production, SOB, wheezing, DOE, pleuritic pain  Cardiology:   chest pain, palpitations, orthopnea, PND, edema, syncope   Gastrointestinal: abdominal pain and distention, N/V, dysphagia, change in bowel habits, bleeding  Genitourinary: frequency, urgency, dysuria, hematuria, incontinence  Muskuloskeletal : arthralgia, myalgia  Hematology:  easy bruising, bleeding, lymphadenopathy  Dermatological: rash, ulceration, mole change, new lesion  Endocrine: hot flashes or polydipsia  Neurological:  headache, dizziness, confusion, focal weakness, paresthesia, memory loss, gait disturbance  Psychological:  anxiety, depression, agitation    Objective:   VITALS:    Visit Vitals   ??? BP (!) 166/96   ??? Pulse 99   ??? Temp 98.8 ??F (37.1 ??C)   ??? Resp 27   ??? Ht 6' 1" (1.854 m)   ??? Wt 105.7 kg (233 lb)   ??? SpO2 100%   ??? BMI 30.74 kg/m2     Temp (24hrs), Avg:98.8 ??F (37.1 ??C), Min:98.8 ??F (37.1 ??C), Max:98.8 ??F (37.1 ??C)    PHYSICAL EXAM:    General:    Alert, fully dress in regular clothing (no gown).  IV in right axillary region, no distress, appears stated age.   Fistula in right arm  HEENT: Atraumatic, anicteric sclerae, pink conjunctivae     No oral ulcers, mucosa moist, throat clear.  Hearing intact.  Neck:  Supple, symmetrical,  thyroid: non tender  Lungs:   Coarse BS.  No Wheezing or Rhonchi. Few crackles.   Chest wall:  No tenderness  No Accessory muscle use.  Heart:   Regular  rhythm,  No  murmur   No gallop.  3+ pitting edema lower legs  Abdomen:   Lower abdomen hyperpigmented and distended, firm, +striae, +abdominal wall edema, Non-tender. + BS.  No masses  Extremities: No cyanosis.  No clubbing  Skin:     Not pale Not Jaundiced  No rashes   Psych:  Not depressed.  Not anxious or agitated.  Neurologic: EOMs intact. No facial asymmetry. No aphasia or slurred speech. Symmetrical strength, Alert and oriented X 3.     ________________________________________________________________________  Care Plan discussed with:    Comments   Patient y    Family      RN     Care Manager                    Consultant:  y Dr. Rennis Golden  Dr. Smitty Cords   ________________________________________________________________________  TOTAL TIME:  45 minutes    ________________________________________________________________________  Nolon Nations, MD      Procedures: see electronic medical records for all procedures/Xrays and details which were not copied into this note but were reviewed prior to creation of Plan.    LAB DATA REVIEWED:    Recent Results (from the past 24 hour(s))   EKG, 12 LEAD, INITIAL    Collection Time: 07/21/16  3:11 AM   Result Value Ref Range    Ventricular Rate 97 BPM    Atrial Rate 97 BPM    P-R Interval 180 ms    QRS Duration 88 ms    Q-T Interval 398 ms    QTC Calculation (Bezet) 505 ms    Calculated P Axis 72 degrees    Calculated R Axis -12 degrees    Calculated T Axis 84 degrees    Diagnosis  Normal sinus rhythm  Possible Left atrial enlargement  Prolonged QT  Abnormal ECG  When compared with ECG of 27-Jun-2016 18:26,  No significant change was found     CBC WITH AUTOMATED DIFF    Collection Time: 07/21/16  3:50 AM   Result Value Ref Range    WBC 5.9 4.1 - 11.1 K/uL    RBC 2.73 (L) 4.10 - 5.70 M/uL    HGB 8.2 (L) 12.1 - 17.0 g/dL    HCT 26.6 (L) 36.6 - 50.3 %    MCV 97.4 80.0 - 99.0 FL    MCH 30.0 26.0 - 34.0 PG     MCHC 30.8 30.0 - 36.5 g/dL    RDW 17.3 (H) 11.5 - 14.5 %    PLATELET 130 (L) 150 - 400 K/uL    MPV 9.8 8.9 - 12.9 FL    NRBC 0.0 0 PER 100 WBC    ABSOLUTE NRBC 0.00 0.00 - 0.01 K/uL    NEUTROPHILS 78 (H) 32 - 75 %    LYMPHOCYTES 5 (L) 12 - 49 %    MONOCYTES 8 5 - 13 %    EOSINOPHILS 8 (H) 0 - 7 %    BASOPHILS 1 0 - 1 %    IMMATURE GRANULOCYTES 0 0.0 - 0.5 %    ABS. NEUTROPHILS 4.5 1.8 - 8.0 K/UL    ABS. LYMPHOCYTES 0.3 (L) 0.8 - 3.5 K/UL    ABS. MONOCYTES 0.5 0.0 - 1.0 K/UL    ABS. EOSINOPHILS 0.5 (H) 0.0 - 0.4 K/UL    ABS. BASOPHILS 0.1 0.0 - 0.1 K/UL    ABS. IMM. GRANS. 0.0 0.00 - 0.04 K/UL    DF AUTOMATED      RBC COMMENTS ANISOCYTOSIS  1+       CK W/ CKMB & INDEX    Collection Time: 07/21/16  3:50 AM   Result Value Ref Range    CK 2263 (H) 39 - 308 U/L    CK - MB 26.1 (H) <3.6 NG/ML    CK-MB Index 1.2 0 - 2.5     TROPONIN I    Collection Time: 07/21/16  3:50 AM   Result Value Ref Range    Troponin-I, Qt. 0.11 (H) <0.05 ng/mL   NT-PRO BNP    Collection Time: 07/21/16  3:50 AM   Result Value Ref Range    NT pro-BNP >30000 (H) 0 - 125 PG/ML   D DIMER    Collection Time: 07/21/16  3:50 AM   Result Value Ref Range    D-dimer 4.28 (H) 0.00 - 0.65 mg/L FEU   METABOLIC PANEL, COMPREHENSIVE    Collection Time: 07/21/16  3:50 AM   Result Value Ref Range    Sodium 140 136 - 145 mmol/L    Potassium 4.7 3.5 - 5.1 mmol/L    Chloride 108 97 - 108 mmol/L    CO2 20 (L) 21 - 32 mmol/L    Anion gap 12 5 - 15 mmol/L    Glucose 82 65 - 100 mg/dL    BUN 32 (H) 6 - 20 MG/DL    Creatinine 7.37 (H) 0.70 - 1.30 MG/DL    BUN/Creatinine ratio 4 (L) 12 - 20      GFR est AA 10 (L) >60 ml/min/1.43m    GFR est non-AA 8 (L) >60 ml/min/1.742m   Calcium 7.5 (L) 8.5 - 10.1 MG/DL    Bilirubin, total 0.5 0.2 - 1.0 MG/DL    ALT (SGPT)  15 12 - 78 U/L    AST (SGOT) 33 15 - 37 U/L    Alk. phosphatase 263 (H) 45 - 117 U/L    Protein, total 6.7 6.4 - 8.2 g/dL    Albumin 3.1 (L) 3.5 - 5.0 g/dL    Globulin 3.6 2.0 - 4.0 g/dL     A-G Ratio 0.9 (L) 1.1 - 2.2     POC CHEM8    Collection Time: 07/21/16  5:03 AM   Result Value Ref Range    Calcium, ionized (POC) 0.90 (L) 1.12 - 1.32 mmol/L    Sodium (POC) 141 136 - 145 mmol/L    Potassium (POC) 4.5 3.5 - 5.1 mmol/L    Chloride (POC) 103 98 - 107 mmol/L    CO2 (POC) 27 21 - 32 mmol/L    Anion gap (POC) 17 10 - 20 mmol/L    Glucose (POC) 84 65 - 100 mg/dL    BUN (POC) 32 (H) 9 - 20 mg/dL    Creatinine (POC) 7.6 (H) 0.6 - 1.3 mg/dL    GFRAA, POC 9 (L) >60 ml/min/1.68m    GFRNA, POC 8 (L) >60 ml/min/1.744m   Hematocrit (POC) 24 (L) 36.6 - 50.3 %    Comment Comment Not Indicated.

## 2016-07-21 NOTE — ED Notes (Signed)
Patient states "you know what, I want to leave AMA. That doctor pissed me off, I want to go back to [place]." Patient reporting that the Hospitalist upset him and that he prefers to go back to [place] and will get his dialysis there. Attempted to convince patient to stay in ED, but patient refusing to stay and removed his cardiac monitor leads, pulse ox, and blood pressure cuff.

## 2016-07-21 NOTE — ED Notes (Signed)
Report off to oncoming RN, Jenni

## 2016-07-21 NOTE — ED Notes (Addendum)
CT Tech in room to take patient to CT. Reports that the patient is unable to go to CT with the PIV that he has. This RN followed up with Charge RN to obtain new PIV access.      8:46 AM- Patient ambulatory to and from ED bathroom with a steady gait.    8:54 AM- Charge RN at bedside to obtain PIV access.

## 2016-07-21 NOTE — ED Notes (Signed)
Bedside and Verbal shift change report given to Valena, RN (oncoming nurse) by Jenni, RN (offgoing nurse). Report included the following information SBAR, ED Summary and MAR.

## 2016-07-21 NOTE — ED Notes (Signed)
Bedside shift change report given to Valena B RN (oncoming nurse) by Jenni Q RN (offgoing nurse). Report included the following information SBAR.

## 2016-07-21 NOTE — Consults (Signed)
Renal -    ESRD  Very poorly adherent with medical advice  Anemia on CKD  Pulmonary edema (although room air oxygen saturation is ok and weight is not too far off from recent weights)  HTN    Plan: HD this am and then I am ok with his being discharged after HD. 2 K bath, remove 3-4 kg. [facility] is aware.    Alexandra Posadas

## 2016-07-21 NOTE — ED Notes (Signed)
Labs drawn.  Unable to float IV . Removed tip intact.  2 x 2 to area.

## 2016-07-21 NOTE — ED Provider Notes (Signed)
EMERGENCY DEPARTMENT HISTORY AND PHYSICAL EXAM      Date: 07/21/2016  Patient Name: David [name]    History of Presenting Illness     Chief Complaint   Patient presents with   ??? Chest Pain     Pt arrives via wheelchair to triage with c/o chest pain and SOB that started yesterday. Hx of asthma.  Pt reports chest tightness and difficulty breathing.  Denies fever and chills. Currently usinig his inhaler with minimal relief.   ??? Shortness of Breath       History Provided By: Patient    HPI: David [name] is a 46 y.o. male, pmhx MI, CKD, C. Difficile, HTN, Asthma, who presents via Wheelchair to the ED c/o sharp, pressuring, left-sided chest pain x2 days. Pt reports associated shortness of breath above his baseline and bilateral lower extremity swelling x yesterday. Pt is also complaining of persistent nausea, vomiting and diarrhea with secondary diffuse abdominal pain. Pt notes he was recently treated for C. Diff while admitted last month for PNA, and his current sxs are similar to C. Diff. Pt states normally gets dialyzed (Monday, Wednesday and Friday) in [place], [place]; but the pt has been recently released from the center because of his non-compliance / too many missed appointments. Pt states he was last dialyzed 07/14/2016. He reports a hx of MI and x2 cardiac stents, but denies any further follow-up with cardiology. Pt reports chronic use of 2-3L oxygen NC when needed. Pt specifically denies any recent fever, chills, lightheadedness, dizziness, numbness, weakness, tingling, HA, heart palpitations, urinary sxs, changes in BM, changes in PO intake, melena, hematochezia, cough, or congestion.     PCP: Phys Other, MD    PMHx: Significant for CKD, C. Difficile, HTN, Asthma  PSHx: Significant for hernia repair, Colonoscopy, Cardiac stents   Social Hx: +tobacco (down to 3 cigarettes a day), -EtOH, -Illicit Drugs     There are no other complaints, changes, or physical findings at this time.      Current Facility-Administered Medications   Medication Dose Route Frequency Provider Last Rate Last Dose   ??? sodium chloride (NS) flush 5-10 mL  5-10 mL IntraVENous Q8H Bacilio Abascal N O'Bier, MD   10 mL at 07/21/16 3016   ??? sodium chloride (NS) flush 5-10 mL  5-10 mL IntraVENous PRN Viral Schramm N O'Bier, MD       ??? metroNIDAZOLE (FLAGYL) IVPB premix 500 mg  500 mg IntraVENous Q8H Nolon Nations, MD       ??? vancomycin 50 mg/mL oral solution (compounded) 250 mg  250 mg Oral Q6H Nolon Nations, MD         Current Outpatient Prescriptions   Medication Sig Dispense Refill   ??? ASPIRIN/SALICYLAMIDE/CAFFEINE (BC HEADACHE POWDER PO) Take  by mouth as needed for Other (headache).     ??? albuterol (PROVENTIL HFA, VENTOLIN HFA, PROAIR HFA) 90 mcg/actuation inhaler Take 2 Puffs by inhalation every four (4) hours as needed for Wheezing. 1 Inhaler 1   ??? sevelamer (RENAGEL) 400 mg tablet Take 800 mg by mouth three (3) times daily (with meals).     ??? carvedilol (COREG) 12.5 mg tablet Take  by mouth two (2) times daily (with meals).     ??? hydrALAZINE (APRESOLINE) 100 mg tablet Take 100 mg by mouth three (3) times daily.     ??? amLODIPine (NORVASC) 10 mg tablet Take 10 mg by mouth daily.     ??? furosemide (LASIX) 80 mg tablet Take 80  mg by mouth daily.     ??? albuterol (PROVENTIL HFA, VENTOLIN HFA, PROAIR HFA) 90 mcg/actuation inhaler Take 2 Puffs by inhalation every four (4) hours as needed for Wheezing.         Past History     Past Medical History:  Past Medical History:   Diagnosis Date   ??? Adverse effect of anesthesia     sleep apnea uses oxygen at night    ??? Ascites    ??? Asthma    ??? C. difficile colitis    ??? CKD (chronic kidney disease) stage V requiring chronic dialysis ([place])    ??? HTN (hypertension)    ??? SBP (spontaneous bacterial peritonitis) [facility])        Past Surgical History:  Past Surgical History:   Procedure Laterality Date   ??? COLONOSCOPY N/A 03/04/2016    COLONOSCOPY performed by Macie Burows, MD at [facility]    ??? HX HERNIA REPAIR  2008    right inguinal and umbilical   ??? SIGMOIDOSCOPY,DIAGNOSTIC  03/04/2016            Family History:  Family History   Problem Relation Age of Onset   ??? Hypertension Mother        Social History:  Social History   Substance Use Topics   ??? Smoking status: Current Every Day Smoker     Packs/day: 0.50     Years: 10.00   ??? Smokeless tobacco: Never Used   ??? Alcohol use No       Allergies:  Allergies   Allergen Reactions   ??? Lisinopril Shortness of Breath   ??? Morphine Shortness of Breath   ??? Toradol [Ketorolac] Shortness of Breath         Review of Systems   Review of Systems   Constitutional: Negative for chills and fever.   HENT: Negative for congestion, ear pain, rhinorrhea and sore throat.    Respiratory: Positive for shortness of breath. Negative for cough.    Cardiovascular: Positive for chest pain and leg swelling. Negative for palpitations.   Gastrointestinal: Positive for abdominal pain, diarrhea, nausea and vomiting. Negative for constipation.        No melena  No hematochezia   Endocrine: Negative for polyuria.   Genitourinary: Negative for dysuria, frequency and hematuria.   Neurological: Negative for dizziness, weakness, light-headedness, numbness and headaches.        No tingling   All other systems reviewed and are negative.      Physical Exam   Physical Exam   Nursing note and vitals reviewed.    General appearance - well nourished, well appearing, and in no distress; overweight   Eyes - pupils equal and reactive, extraocular eye movements intact  ENT - mucous membranes moist, pharynx normal without lesions  Neck - supple, no significant adenopathy; non-tender to palpation  Chest - clear to auscultation, no wheezes, rales or rhonchi; non-tender to palpation; dialysis catheter L upper chest   Heart - normal rate and regular rhythm, S1 and S2 normal, no murmurs noted  Abdomen - soft, mild generalized tenderness, distended, no masses or organomegaly   Musculoskeletal - no joint tenderness, deformity or swelling; normal ROM  Extremities - peripheral pulses normal, 2+ pitting edema  Skin - normal coloration and turgor, no rashes  Neurological - alert, oriented x3, normal speech, no focal findings or movement disorder noted  Written by Quenton Fetter, ED Scribe, as dictated by Blasa Raisch N O'Bier, MD  Diagnostic Study Results     Labs -     Recent Results (from the past 12 hour(s))   EKG, 12 LEAD, INITIAL    Collection Time: 07/21/16  3:11 AM   Result Value Ref Range    Ventricular Rate 97 BPM    Atrial Rate 97 BPM    P-R Interval 180 ms    QRS Duration 88 ms    Q-T Interval 398 ms    QTC Calculation (Bezet) 505 ms    Calculated P Axis 72 degrees    Calculated R Axis -12 degrees    Calculated T Axis 84 degrees    Diagnosis       Normal sinus rhythm  Possible Left atrial enlargement  Prolonged QT  Abnormal ECG  When compared with ECG of 27-Jun-2016 18:26,  No significant change was found     CBC WITH AUTOMATED DIFF    Collection Time: 07/21/16  3:50 AM   Result Value Ref Range    WBC 5.9 4.1 - 11.1 K/uL    RBC 2.73 (L) 4.10 - 5.70 M/uL    HGB 8.2 (L) 12.1 - 17.0 g/dL    HCT 26.6 (L) 36.6 - 50.3 %    MCV 97.4 80.0 - 99.0 FL    MCH 30.0 26.0 - 34.0 PG    MCHC 30.8 30.0 - 36.5 g/dL    RDW 17.3 (H) 11.5 - 14.5 %    PLATELET 130 (L) 150 - 400 K/uL    MPV 9.8 8.9 - 12.9 FL    NRBC 0.0 0 PER 100 WBC    ABSOLUTE NRBC 0.00 0.00 - 0.01 K/uL    NEUTROPHILS 78 (H) 32 - 75 %    LYMPHOCYTES 5 (L) 12 - 49 %    MONOCYTES 8 5 - 13 %    EOSINOPHILS 8 (H) 0 - 7 %    BASOPHILS 1 0 - 1 %    IMMATURE GRANULOCYTES 0 0.0 - 0.5 %    ABS. NEUTROPHILS 4.5 1.8 - 8.0 K/UL    ABS. LYMPHOCYTES 0.3 (L) 0.8 - 3.5 K/UL    ABS. MONOCYTES 0.5 0.0 - 1.0 K/UL    ABS. EOSINOPHILS 0.5 (H) 0.0 - 0.4 K/UL    ABS. BASOPHILS 0.1 0.0 - 0.1 K/UL    ABS. IMM. GRANS. 0.0 0.00 - 0.04 K/UL    DF AUTOMATED      RBC COMMENTS ANISOCYTOSIS  1+       CK W/ CKMB & INDEX    Collection Time: 07/21/16  3:50 AM    Result Value Ref Range    CK 2263 (H) 39 - 308 U/L    CK - MB 26.1 (H) <3.6 NG/ML    CK-MB Index 1.2 0 - 2.5     TROPONIN I    Collection Time: 07/21/16  3:50 AM   Result Value Ref Range    Troponin-I, Qt. 0.11 (H) <0.05 ng/mL   NT-PRO BNP    Collection Time: 07/21/16  3:50 AM   Result Value Ref Range    NT pro-BNP >30000 (H) 0 - 125 PG/ML   D DIMER    Collection Time: 07/21/16  3:50 AM   Result Value Ref Range    D-dimer 4.28 (H) 0.00 - 0.65 mg/L FEU   METABOLIC PANEL, COMPREHENSIVE    Collection Time: 07/21/16  3:50 AM   Result Value Ref Range    Sodium 140 136 - 145 mmol/L    Potassium 4.7 3.5 -  5.1 mmol/L    Chloride 108 97 - 108 mmol/L    CO2 20 (L) 21 - 32 mmol/L    Anion gap 12 5 - 15 mmol/L    Glucose 82 65 - 100 mg/dL    BUN 32 (H) 6 - 20 MG/DL    Creatinine 7.37 (H) 0.70 - 1.30 MG/DL    BUN/Creatinine ratio 4 (L) 12 - 20      GFR est AA 10 (L) >60 ml/min/1.40m    GFR est non-AA 8 (L) >60 ml/min/1.733m   Calcium 7.5 (L) 8.5 - 10.1 MG/DL    Bilirubin, total 0.5 0.2 - 1.0 MG/DL    ALT (SGPT) 15 12 - 78 U/L    AST (SGOT) 33 15 - 37 U/L    Alk. phosphatase 263 (H) 45 - 117 U/L    Protein, total 6.7 6.4 - 8.2 g/dL    Albumin 3.1 (L) 3.5 - 5.0 g/dL    Globulin 3.6 2.0 - 4.0 g/dL    A-G Ratio 0.9 (L) 1.1 - 2.2     POC CHEM8    Collection Time: 07/21/16  5:03 AM   Result Value Ref Range    Calcium, ionized (POC) 0.90 (L) 1.12 - 1.32 mmol/L    Sodium (POC) 141 136 - 145 mmol/L    Potassium (POC) 4.5 3.5 - 5.1 mmol/L    Chloride (POC) 103 98 - 107 mmol/L    CO2 (POC) 27 21 - 32 mmol/L    Anion gap (POC) 17 10 - 20 mmol/L    Glucose (POC) 84 65 - 100 mg/dL    BUN (POC) 32 (H) 9 - 20 mg/dL    Creatinine (POC) 7.6 (H) 0.6 - 1.3 mg/dL    GFRAA, POC 9 (L) >60 ml/min/1.7362m  GFRNA, POC 8 (L) >60 ml/min/1.66m4m Hematocrit (POC) 24 (L) 36.6 - 50.3 %    Comment Comment Not Indicated.         Radiologic Studies -     CXR Results  (Last 48 hours)               07/21/16 0430  XR CHEST PA LAT Final result     Impression:  IMPRESSION:   Cardiomegaly and bibasilar atelectasis. Mild interstitial edema.            Narrative:  INDICATION:   sob, cp       COMPARISON: June 16, 2016       FINDINGS:       Frontal and lateral views of the chest demonstrate cardiomegaly and a left   internal jugular permacath. The lungs are adequately expanded with mild   bibasilar atelectasis.  Mild interstitial edema. No pleural effusion or   pneumothorax. The osseous structures are unremarkable.                   Medical Decision Making   I am the first provider for this patient.    I reviewed the vital signs, available nursing notes, past medical history, past surgical history, family history and social history.    Vital Signs-Reviewed the patient's vital signs.  Patient Vitals for the past 12 hrs:   Temp Pulse Resp BP SpO2   07/21/16 1014 - 99 27 - 100 %   07/21/16 1013 - - - (!) 166/96 -   07/21/16 0700 - 98 26 - 94 %   07/21/16 0630 - (!) 101 26 (!) 179/98 100 %  07/21/16 0619 - (!) 114 22 (!) 216/120 100 %   07/21/16 0317 98.8 ??F (37.1 ??C) 99 18 (!) 160/101 98 %       Pulse Oximetry Analysis - 98% on 3L NC    Cardiac Monitor:   Rate: 99bpm  Rhythm: Normal Sinus Rhythm        Records Reviewed: Nursing Notes, Old Medical Records, Previous electrocardiograms, Previous Radiology Studies and Previous Laboratory Studies    Provider Notes (Medical Decision Making):     DDx: ACS, GERD, Chest Wall strain, Fluid overload, Hypertensive emergency, drug seeking behavior, electrolyte abnormality    ED Course:   Initial assessment performed. The patients presenting problems have been discussed, and they are in agreement with the care plan formulated and outlined with them.  I have encouraged them to ask questions as they arise throughout their visit.    Discussed the risks of smoking and the benefits of smoking cessation as well as the long term sequelae of smoking with the pt who verbalized his  understanding. Reviewed strategies for success, including gradually decreasing the number of cigarettes smoked a day.    EKG interpretation: (Preliminary) 3:11 AM  Rhythm: normal sinus rhythm. Rate (approx.): 97bpm; Axis: normal; Normal PR, QRS intervals; Prolonged QTc intervals; ST/T wave: non-specific changes; Other findings:non- ischemic.  Written by Quenton Fetter, ED Scribe, as dictated by Rowena Moilanen N O'Bier, MD    PROGRESS NOTE:  4:45 AM  Pt reevaluated. Per chart review, pt was admitted at a [facility] in [place], where he was scheduled for dialysis. Records state the pt had a paracentesis performed to check for bacteria, but left AMA prior to receiving dialysis.      Written by Quenton Fetter, ED Scribe, as dictated by Tallin Hart N O'Bier, MD    PROGRESS NOTE:  6:22 AM  Pt reevaluated. requesting pain and nausea medication. Will provide Zofran and Fentanyl.   Written by Quenton Fetter, ED Scribe, as dictated by Norvin Ohlin N O'Bier, MD    Consult Note:  6:42 AM  Shaylan Tutton N O'Bier, MD spoke with Marygrace Drought, MD,  Specialty: Nephrology  Discussed pt's hx, disposition, and available diagnostic and imaging results. Reviewed care plans. Consultant agrees with plans as outlined. Will let [facility] know of the pt, and have him dialyzed today.    Consult Note:  6:45 AM  Denielle Bayard N O'Bier, MD spoke with Dr. Levy Sjogren,   Specialty: Hospitalist  Discussed pt's hx, disposition, and available diagnostic and imaging results. Reviewed care plans. Consultant will evaluate pt for admission.  Written by Quenton Fetter, ED Scribe, as dictated by Saara Kijowski N O'Bier, MD.    PROGRESS NOTE:  7:50 AM  Pt reevaluated. Pt updated on current plan of care, including admission. Pt expressed understanding of current plan.  Written by Quenton Fetter, ED Scribe, as dictated by Dawsyn Ramsaran N O'Bier, MD.    10:48 AM  Pt left prior to receiving dialysis. He wishes to follow up with dialysis in [name] and does not wish to stay despite asking how we can be  of assistance. D/W hospitalist who had informed pt of CT results. Advised pt his condition may worsen while en route to [name]. He feels that he will be okay. He was urged to return to the ED should he reconsider or change his mind.       Disposition:  10:48 AM  ???I informed the pt that he needed dialysis, admission for adequate evaluation for his pulmonary edema and fluid overload, and that by  refusing the above, he is at risk for further deterioration and death.  He is awake, alert, and he understands his condition and the risks involved in leaving.     The patient has received Fentanyl and it has been approximately 15 minutes since last receiving this medication.    He is clinically aware of his surroundings and able to ask appropriate questions, and the nurse present confirms he is not clinically intoxicated and able to make medical decisions. He verbalized knowing the risks and understood it was recommended that he stay and could also return at any time. His nurse was present for the discussion. Pt was provided with warnings regarding worsening of his condition and were provided instructions to follow up with his PCP tomorrow and dialysis today or return to the Emergency Room as soon as possible. This discussion was witnessed by nurse Lars Masson, RN.    PLAN:  1.   Discharge Medication List as of 07/21/2016 11:05 AM      CONTINUE these medications which have NOT CHANGED    Details   ASPIRIN/SALICYLAMIDE/CAFFEINE (BC HEADACHE POWDER PO) Take  by mouth as needed for Other (headache)., Historical Med      !! albuterol (PROVENTIL HFA, VENTOLIN HFA, PROAIR HFA) 90 mcg/actuation inhaler Take 2 Puffs by inhalation every four (4) hours as needed for Wheezing., Print, Disp-1 Inhaler, R-1      sevelamer (RENAGEL) 400 mg tablet Take 800 mg by mouth three (3) times daily (with meals)., Historical Med      carvedilol (COREG) 12.5 mg tablet Take  by mouth two (2) times daily (with meals)., Historical Med       hydrALAZINE (APRESOLINE) 100 mg tablet Take 100 mg by mouth three (3) times daily., Historical Med      amLODIPine (NORVASC) 10 mg tablet Take 10 mg by mouth daily., Historical Med      furosemide (LASIX) 80 mg tablet Take 80 mg by mouth daily., Historical Med      !! albuterol (PROVENTIL HFA, VENTOLIN HFA, PROAIR HFA) 90 mcg/actuation inhaler Take 2 Puffs by inhalation every four (4) hours as needed for Wheezing., Historical Med       !! - Potential duplicate medications found. Please discuss with provider.      STOP taking these medications       pantoprazole (PROTONIX) 40 mg tablet Comments:   Reason for Stopping:           2.   Follow-up Information     Follow up With Details Comments Contact Info    Phys Other, MD Schedule an appointment as soon as possible for a visit in 1 day  Patient can only remember the practice name and not the physician      [facility] Go in 1 day If symptoms worsen [place] Go today            Diagnosis     Clinical Impression:   1. Acute pulmonary edema (HCC)    2. ESRD on hemodialysis (HCC)    3. Pulmonary nodule    4. Pleural effusion    5. Elevated blood pressure reading      Attestations:    This note is prepared by Quenton Fetter, acting as Scribe for Terin Cragle N O'Bier, MD    Jakari Sada N O'Bier, MD : The scribe's documentation has been prepared under my direction and personally reviewed by me in  its entirety. I confirm that the note above accurately reflects all work, treatment, procedures, and medical decision making performed by me.          This note will not be viewable in [place].

## 2016-07-21 NOTE — Consults (Signed)
Consults by Nolon Nations, MD at 07/21/16 1019                Author: Nolon Nations, MD  Service: Internal Medicine  Author Type: Physician       Filed: 07/22/16 1031  Date of Service: 07/21/16 1019  Status: Addendum          Editor: Nolon Nations, MD (Physician)          Related Notes: Original Note by Nolon Nations, MD (Physician) filed at 07/22/16 [place]                                                    Hospitalist Consultation Note      NAME: David [name]    DOB:  [birth year]    MRN:  [id number]       Date/Time:   07/21/2016 10:19 AM      Patient PCP: None      I have been asked to see this patient by the attending, Dr. Rennis Golden , for advice/opinion re: evaluate for admission.  My advice is:   ________________________________________________________________________    Assessment &  Plan:     SOB, CP due to   Volume overload and anasarca from missed dialysis   Noncompliance with dialysis   ESRD on HD   --CXR with mild pulmonary edema, cardiomyopathy.  To have urgent dialysis by nephrology   --has 3+ b/l leg edema, abdominal wall edema   --nephrology consulted to have urgent dialysis      N/V/abdominal pain, diarrhea   Hx recurrent C. Diff   Noncompliant with treatment, documented to have refused FMT, hx repeatedly sign out AMA repeated when IV narcotic stopped   --get stool to check for C. Diff.  Give empiric IV flagyl and oral vancomycin   --give PO challenge in ER.  If he passes oral challenge, may be discharged home on oral vancomycin after dialysis.  Will reassess patient after dialysis is done.      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      Asked patient about his history of signing out AMA repeatedly and that there is suspicion of patient drug seeking behavior since he promptly leaves each time IV narcotic is stopped.  He reports  leaving  because he gets "frustrated", not because doctors stopped giving IV dilaudid.      About 15 minutes after I evaluated patient, was notified by ER nurse that patient wants to leave AMA.  Discussed with ER physician Dr. Lavone Neri.  Would try to convince patient to wait at least until after he has had dialysis.                 Subjective:     CHIEF COMPLAINT:  "SOB, chest pain"      HISTORY OF PRESENT ILLNESS:      David [name] is a 46 y.o.   male with ESRD, HTN, asthma on home O2 2L prn, hx recurrent c. Diff who presents with above complaint.   Patient with multiple admissions (5 in [facility] system in past  2 months) but leaving against medical advice each time.   He reports last dialysis on 3/28.  Two days ago developed SOB, nonproductive cough, pain across chest and in abdomen with nausea and vomiting.   Not been able to take blood pressure medication since 4/2.  Also having diarrhea.  Denies fever, chills.  Report having had 2 diarrhea since being in ER.   He lives in [place] but does not follow with any dialysis doctor or dialysis center since he is not compliant because was "trying to work a full time job".     However, he is no longer working and is on disability currently.     Past Medical History:        Diagnosis  Date         ?  Adverse effect of anesthesia            sleep apnea uses oxygen at night          ?  Ascites       ?  Asthma       ?  C. difficile colitis       ?  CKD (chronic kidney disease) stage V requiring chronic dialysis ([place])       ?  HTN (hypertension)           ?  SBP (spontaneous bacterial peritonitis) [facility])             Past Surgical History:         Procedure  Laterality  Date          ?  COLONOSCOPY  N/A  03/04/2016          COLONOSCOPY performed by Macie Burows, MD at [facility]          ?  HX HERNIA REPAIR    2008          right inguinal and umbilical          ?  SIGMOIDOSCOPY,DIAGNOSTIC    03/04/2016                     Social History       Substance Use Topics          ?  Smoking status:  Current Every Day Smoker              Packs/day:  0.50         Years:  10.00         ?  Smokeless tobacco:  Never Used         ?  Alcohol use  No      Lives alone, on disability.  Denies illicit drug use.        Family History         Problem  Relation  Age of Onset          ?  Hypertension  Mother            Allergies        Allergen  Reactions         ?  Lisinopril  Shortness of Breath     ?  Morphine  Shortness of Breath         ?  Toradol [Ketorolac]  Shortness of Breath              Prior to Admission medications             Medication  Sig  Start Date  End Date  Taking?  Authorizing Provider            ASPIRIN/SALICYLAMIDE/CAFFEINE (BC HEADACHE POWDER PO)  Take  by mouth as needed for Other (headache).      Yes  Historical Provider     albuterol (PROVENTIL HFA, VENTOLIN HFA, PROAIR HFA) 90 mcg/actuation inhaler  Take 2 Puffs by inhalation every four (4) hours as needed for Wheezing.  06/27/16    Yes  Eula Listen, MD     sevelamer (RENAGEL) 400 mg tablet  Take 800 mg by mouth three (3) times daily (with meals).      Yes  Phys Other, MD     carvedilol (COREG) 12.5 mg tablet  Take  by mouth two (2) times daily (with meals).      Yes  Phys Other, MD     hydrALAZINE (APRESOLINE) 100 mg tablet  Take 100 mg by mouth three (3) times daily.      Yes  Phys Other, MD     amLODIPine (NORVASC) 10 mg tablet  Take 10 mg by mouth daily.      Yes  Phys Other, MD     furosemide (LASIX) 80 mg tablet  Take 80 mg by mouth daily.      Yes  Phys Other, MD            albuterol (PROVENTIL HFA, VENTOLIN HFA, PROAIR HFA) 90 mcg/actuation inhaler  Take 2 Puffs by inhalation every four (4) hours as needed for Wheezing.      Yes  Phys Other, MD          Current Facility-Administered Medications          Medication  Dose  Route  Frequency           ?  sodium chloride (NS) flush 5-10 mL   5-10 mL  IntraVENous  Q8H     ?  sodium chloride (NS) flush 5-10 mL   5-10 mL  IntraVENous  PRN     ?  nitroglycerin (Tridil) 200  mcg/ml infusion   0-200 mcg/min  IntraVENous  TITRATE           ?  metroNIDAZOLE (FLAGYL) IVPB premix 500 mg   500 mg  IntraVENous  Q8H          Current Outpatient Prescriptions        Medication  Sig         ?  ASPIRIN/SALICYLAMIDE/CAFFEINE (BC HEADACHE POWDER PO)  Take  by mouth as needed for Other (headache).     ?  albuterol (PROVENTIL HFA, VENTOLIN HFA, PROAIR HFA) 90 mcg/actuation inhaler  Take 2 Puffs by inhalation every four (4) hours as needed for Wheezing.     ?  sevelamer (RENAGEL) 400 mg tablet  Take 800 mg by mouth three (3) times daily (with meals).     ?  carvedilol (COREG) 12.5 mg tablet  Take  by mouth two (2) times daily (with meals).     ?  hydrALAZINE (APRESOLINE) 100 mg tablet  Take 100 mg by mouth three (3) times daily.     ?  amLODIPine (NORVASC) 10 mg tablet  Take 10 mg by mouth daily.     ?  furosemide (LASIX) 80 mg tablet  Take 80 mg by mouth daily.         ?  albuterol (PROVENTIL HFA, VENTOLIN HFA, PROAIR HFA) 90 mcg/actuation inhaler  Take 2 Puffs by inhalation every four (4) hours as  needed for Wheezing.         REVIEW OF SYSTEMS:  BOLD = POSITIVE ; negative = normal text   General:  fever, chills,  sweats, weakness, weight loss/gain   Eyes: blurred vision, eye pain, loss of vision, diplopia   Ear Nose and Throat: rhinorrhea, pharyngitis, otalgia, tinnitus, speech or swallowing difficulties   Respiratory: cough, sputum production, SOB , wheezing, DOE, pleuritic pain   Cardiology:   chest pain, palpitations,  orthopnea, PND, edema, syncope    Gastrointestinal: abdominal pain and distention, N/V , dysphagia, change in bowel habits, bleeding   Genitourinary: frequency, urgency, dysuria, hematuria, incontinence   Muskuloskeletal : arthralgia, myalgia   Hematology:  easy bruising, bleeding, lymphadenopathy   Dermatological: rash, ulceration, mole change, new lesion   Endocrine: hot flashes or polydipsia   Neurological:  headache, dizziness, confusion, focal weakness, paresthesia, memory loss,  gait disturbance   Psychological:  anxiety, depression, agitation        Objective:     VITALS:       Visit Vitals         ?  BP  (!) 166/96     ?  Pulse  99     ?  Temp  98.8 ??F (37.1 ??C)     ?  Resp  27     ?  Ht  '6\' 1"'  (1.854 m)     ?  Wt  105.7 kg (233 lb)     ?  SpO2  100%         ?  BMI  30.74 kg/m2        Temp (24hrs), Avg:98.8 ??F (37.1 ??C), Min:98.8 ??F (37.1 ??C), Max:98.8 ??F (37.1 ??C)      PHYSICAL EXAM:      General:    Alert, fully dress in regular clothing (no gown).  IV in right axillary region, no distress, appears stated age.   Fistula in right arm   HEENT: Atraumatic, anicteric sclerae, pink conjunctivae      No oral ulcers, mucosa moist, throat clear.  Hearing intact.   Neck:  Supple, symmetrical,  thyroid: non tender   Lungs:   Coarse BS.  No Wheezing or Rhonchi. Few crackles.   Chest wall:  No tenderness  No Accessory muscle use.   Heart:   Regular  rhythm,  No  murmur   No gallop.  3+ pitting edema lower legs   Abdomen:   Lower abdomen hyperpigmented and distended, firm, +striae, +abdominal wall edema, Non-tender. + BS.  No masses   Extremities: No cyanosis.  No clubbing   Skin:     Not pale Not Jaundiced  No rashes    Psych:  Not depressed.  Not anxious or agitated.   Neurologic: EOMs intact. No facial asymmetry. No aphasia or slurred speech. Symmetrical strength, Alert and oriented X 3.       ________________________________________________________________________   Care Plan discussed with:           Comments         Patient  y           Family              RN         Care Manager                            Consultant:   y  Dr. Rennis Golden   Dr. Smitty Cords  ________________________________________________________________________   TOTAL TIME:  45 minutes      ________________________________________________________________________   Nolon Nations, MD         Procedures: see electronic medical records for all procedures/Xrays and details which were not copied into this note but were reviewed prior to  creation of Plan.      LAB DATA REVIEWED:       Recent Results (from the past 24 hour(s))     EKG, 12 LEAD, INITIAL          Collection Time: 07/21/16  3:11 AM         Result  Value  Ref Range            Ventricular Rate  97  BPM       Atrial Rate  97  BPM       P-R Interval  180  ms       QRS Duration  88  ms       Q-T Interval  398  ms       QTC Calculation (Bezet)  505  ms       Calculated P Axis  72  degrees       Calculated R Axis  -12  degrees       Calculated T Axis  84  degrees       Diagnosis                 Normal sinus rhythm   Possible Left atrial enlargement   Prolonged QT   Abnormal ECG   When compared with ECG of 27-Jun-2016 18:26,   No significant change was found          CBC WITH AUTOMATED DIFF          Collection Time: 07/21/16  3:50 AM         Result  Value  Ref Range            WBC  5.9  4.1 - 11.1 K/uL       RBC  2.73 (L)  4.10 - 5.70 M/uL       HGB  8.2 (L)  12.1 - 17.0 g/dL       HCT  26.6 (L)  36.6 - 50.3 %       MCV  97.4  80.0 - 99.0 FL       MCH  30.0  26.0 - 34.0 PG       MCHC  30.8  30.0 - 36.5 g/dL       RDW  17.3 (H)  11.5 - 14.5 %       PLATELET  130 (L)  150 - 400 K/uL       MPV  9.8  8.9 - 12.9 FL       NRBC  0.0  0 PER 100 WBC       ABSOLUTE NRBC  0.00  0.00 - 0.01 K/uL       NEUTROPHILS  78 (H)  32 - 75 %       LYMPHOCYTES  5 (L)  12 - 49 %       MONOCYTES  8  5 - 13 %       EOSINOPHILS  8 (H)  0 - 7 %       BASOPHILS  1  0 - 1 %       IMMATURE GRANULOCYTES  0  0.0 - 0.5 %  ABS. NEUTROPHILS  4.5  1.8 - 8.0 K/UL       ABS. LYMPHOCYTES  0.3 (L)  0.8 - 3.5 K/UL       ABS. MONOCYTES  0.5  0.0 - 1.0 K/UL       ABS. EOSINOPHILS  0.5 (H)  0.0 - 0.4 K/UL       ABS. BASOPHILS  0.1  0.0 - 0.1 K/UL       ABS. IMM. GRANS.  0.0  0.00 - 0.04 K/UL       DF  AUTOMATED          RBC COMMENTS  ANISOCYTOSIS   1+             CK W/ CKMB & INDEX          Collection Time: 07/21/16  3:50 AM         Result  Value  Ref Range            CK  2263 (H)  39 - 308 U/L       CK - MB  26.1 (H)  <3.6 NG/ML        CK-MB Index  1.2  0 - 2.5         TROPONIN I          Collection Time: 07/21/16  3:50 AM         Result  Value  Ref Range            Troponin-I, Qt.  0.11 (H)  <0.05 ng/mL       NT-PRO BNP          Collection Time: 07/21/16  3:50 AM         Result  Value  Ref Range            NT pro-BNP  >30000 (H)  0 - 125 PG/ML       D DIMER          Collection Time: 07/21/16  3:50 AM         Result  Value  Ref Range            D-dimer  4.28 (H)  0.00 - 0.65 mg/L FEU       METABOLIC PANEL, COMPREHENSIVE          Collection Time: 07/21/16  3:50 AM         Result  Value  Ref Range            Sodium  140  136 - 145 mmol/L       Potassium  4.7  3.5 - 5.1 mmol/L       Chloride  108  97 - 108 mmol/L       CO2  20 (L)  21 - 32 mmol/L       Anion gap  12  5 - 15 mmol/L       Glucose  82  65 - 100 mg/dL       BUN  32 (H)  6 - 20 MG/DL       Creatinine  7.37 (H)  0.70 - 1.30 MG/DL       BUN/Creatinine ratio  4 (L)  12 - 20         GFR est AA  10 (L)  >60 ml/min/1.85m       GFR est non-AA  8 (L)  >60 ml/min/1.7104m      Calcium  7.5 (L)  8.5 -  10.1 MG/DL       Bilirubin, total  0.5  0.2 - 1.0 MG/DL       ALT (SGPT)  15  12 - 78 U/L       AST (SGOT)  33  15 - 37 U/L       Alk. phosphatase  263 (H)  45 - 117 U/L       Protein, total  6.7  6.4 - 8.2 g/dL       Albumin  3.1 (L)  3.5 - 5.0 g/dL       Globulin  3.6  2.0 - 4.0 g/dL       A-G Ratio  0.9 (L)  1.1 - 2.2         POC CHEM8          Collection Time: 07/21/16  5:03 AM         Result  Value  Ref Range            Calcium, ionized (POC)  0.90 (L)  1.12 - 1.32 mmol/L       Sodium (POC)  141  136 - 145 mmol/L       Potassium (POC)  4.5  3.5 - 5.1 mmol/L       Chloride (POC)  103  98 - 107 mmol/L       CO2 (POC)  27  21 - 32 mmol/L       Anion gap (POC)  17  10 - 20 mmol/L       Glucose (POC)  84  65 - 100 mg/dL       BUN (POC)  32 (H)  9 - 20 mg/dL       Creatinine (POC)  7.6 (H)  0.6 - 1.3 mg/dL       GFRAA, POC  9 (L)  >60 ml/min/1.58m       GFRNA, POC  8 (L)  >60 ml/min/1.740m       Hematocrit (POC)  24 (L)  36.6 - 50.3 %            Comment  Comment Not Indicated.

## 2016-07-21 NOTE — Consults (Signed)
Renal -    ESRD  Very poorly adherent with medical advice  Anemia on CKD  Pulmonary edema (although room air oxygen saturation is ok and weight is not too far off from recent weights)  HTN    Plan: HD this am and then I am ok with his being discharged after HD. 2 K bath, remove 3-4 kg. Delena Serve is aware.    Charlynn Salih

## 2016-08-05 ENCOUNTER — Inpatient Hospital Stay
Admit: 2016-08-05 | Discharge: 2016-08-05 | Disposition: A | Discharge status: Home / Self Care | Payer: MEDICARE | Attending: Emergency Medicine

## 2016-08-05 ENCOUNTER — Emergency Department: Admit: 2016-08-05 | Payer: MEDICARE

## 2016-08-05 DIAGNOSIS — R1084 Generalized abdominal pain: Secondary | ICD-10-CM

## 2016-08-05 LAB — EKG, 12 LEAD, INITIAL
Atrial Rate: 101 {beats}/min
Calculated P Axis: 59 degrees
Calculated R Axis: -19 degrees
Calculated T Axis: 86 degrees
P-R Interval: 172 ms
Q-T Interval: 372 ms
QRS Duration: 78 ms
QTC Calculation (Bezet): 482 ms
Ventricular Rate: 101 {beats}/min

## 2016-08-05 LAB — CBC WITH AUTOMATED DIFF
ABS. BASOPHILS: 0 10*3/uL (ref 0.0–0.1)
ABS. EOSINOPHILS: 0.3 10*3/uL (ref 0.0–0.4)
ABS. IMM. GRANS.: 0 10*3/uL (ref 0.00–0.04)
ABS. LYMPHOCYTES: 0.5 10*3/uL — ABNORMAL LOW (ref 0.8–3.5)
ABS. MONOCYTES: 0.5 10*3/uL (ref 0.0–1.0)
ABS. NEUTROPHILS: 5 10*3/uL (ref 1.8–8.0)
ABSOLUTE NRBC: 0 10*3/uL (ref 0.00–0.01)
BASOPHILS: 0 % (ref 0–1)
EOSINOPHILS: 4 % (ref 0–7)
HCT: 25.9 % — ABNORMAL LOW (ref 36.6–50.3)
HGB: 8.2 g/dL — ABNORMAL LOW (ref 12.1–17.0)
IMMATURE GRANULOCYTES: 0 % (ref 0.0–0.5)
LYMPHOCYTES: 8 % — ABNORMAL LOW (ref 12–49)
MCH: 31.1 PG (ref 26.0–34.0)
MCHC: 31.7 g/dL (ref 30.0–36.5)
MCV: 98.1 FL (ref 80.0–99.0)
MONOCYTES: 8 % (ref 5–13)
MPV: 10.3 FL (ref 8.9–12.9)
NEUTROPHILS: 80 % — ABNORMAL HIGH (ref 32–75)
NRBC: 0 PER 100 WBC
PLATELET: 108 10*3/uL — ABNORMAL LOW (ref 150–400)
RBC: 2.64 M/uL — ABNORMAL LOW (ref 4.10–5.70)
RDW: 17.5 % — ABNORMAL HIGH (ref 11.5–14.5)
WBC: 6.3 10*3/uL (ref 4.1–11.1)

## 2016-08-05 LAB — METABOLIC PANEL, COMPREHENSIVE
A-G Ratio: 0.8 — ABNORMAL LOW (ref 1.1–2.2)
ALT (SGPT): 28 U/L (ref 12–78)
AST (SGOT): 22 U/L (ref 15–37)
Albumin: 3 g/dL — ABNORMAL LOW (ref 3.5–5.0)
Alk. phosphatase: 255 U/L — ABNORMAL HIGH (ref 45–117)
Anion gap: 11 mmol/L (ref 5–15)
BUN/Creatinine ratio: 7 — ABNORMAL LOW (ref 12–20)
BUN: 56 MG/DL — ABNORMAL HIGH (ref 6–20)
Bilirubin, total: 0.4 MG/DL (ref 0.2–1.0)
CO2: 21 mmol/L (ref 21–32)
Calcium: 7.2 MG/DL — ABNORMAL LOW (ref 8.5–10.1)
Chloride: 112 mmol/L — ABNORMAL HIGH (ref 97–108)
Creatinine: 7.88 MG/DL — ABNORMAL HIGH (ref 0.70–1.30)
GFR est AA: 9 mL/min/{1.73_m2} — ABNORMAL LOW (ref >60)
GFR est non-AA: 7 mL/min/{1.73_m2} — ABNORMAL LOW (ref >60)
Globulin: 3.6 g/dL (ref 2.0–4.0)
Glucose: 101 mg/dL — ABNORMAL HIGH (ref 65–100)
Potassium: 4.8 mmol/L (ref 3.5–5.1)
Protein, total: 6.6 g/dL (ref 6.4–8.2)
Sodium: 144 mmol/L (ref 136–145)

## 2016-08-05 LAB — PHOSPHORUS: Phosphorus: 3 MG/DL (ref 2.6–4.7)

## 2016-08-05 LAB — LACTIC ACID: Lactic acid: 0.9 MMOL/L (ref 0.4–2.0)

## 2016-08-05 LAB — MAGNESIUM: Magnesium: 2.3 mg/dL (ref 1.6–2.4)

## 2016-08-05 LAB — EKG 12-LEAD
Atrial Rate: 101 {beats}/min
P Axis: 59 degrees
P-R Interval: 172 ms
Q-T Interval: 372 ms
QRS Duration: 78 ms
QTc Calculation (Bazett): 482 ms
R Axis: -19 degrees
T Axis: 86 degrees
Ventricular Rate: 101 {beats}/min

## 2016-08-05 MED ORDER — FENTANYL CITRATE (PF) 50 MCG/ML IJ SOLN
50 mcg/mL | INTRAMUSCULAR | Status: AC
Start: 2016-08-05 — End: 2016-08-05
  Administered 2016-08-05: 16:00:00 via INTRAVENOUS

## 2016-08-05 MED ORDER — OXYCODONE-ACETAMINOPHEN 5 MG-325 MG TAB
5-325 mg | ORAL_TABLET | ORAL | 0 refills | Status: DC | PRN
Start: 2016-08-05 — End: 2016-08-14

## 2016-08-05 MED FILL — FENTANYL CITRATE (PF) 50 MCG/ML IJ SOLN: 50 mcg/mL | INTRAMUSCULAR | Qty: 2

## 2016-08-05 NOTE — ED Notes (Signed)
MD Kizzie Bane reviewed discharge instructions with the patient.  The patient verbalized understanding.  Pt left via w/c.

## 2016-08-05 NOTE — ED Notes (Signed)
Enteric precautions in place.

## 2016-08-05 NOTE — ED Notes (Addendum)
Assumed care of pt from triage.pt ambulated from w/c to room.  Changed into gown. EKG completed.  Pt reports he woke up with chest pain at 0500 to the left side of chest down left arm, no jaw pain. Pt reports SOB also. Pt wears 3-4 02 N/C for asthma. Pt does not usually does not bring it out with him. 02 100% on room air currently.  Pt reports he started vomiting and diarrhea yesterday. He last vomited 1 hour ago after he ate toast. Pt has had 6-8 episodes of diarrhea since this morning, last episode was 20 minutes ago.  BSC in room and advised we need a sample.  Pt falling asleep during exam due to he didn't sleep well.  Pt has (+) 2 pitting edema of BLE that he says is new.  Pt did not take meds this morningand has not had dialysis in 2 days due to not feeling good.  Pt does not have kidney doctor.    Pt had CDIFF and stopped medication a week ago.

## 2016-08-05 NOTE — ED Notes (Signed)
MD aware that pt ambulated to the restroom without collecting stool and breaking contact precautions.  MD states he is not concerned about CDIFF due to he has not had a BM since earlier this morning and pt will be leaving shortly.

## 2016-08-05 NOTE — ED Provider Notes (Signed)
EMERGENCY DEPARTMENT HISTORY AND PHYSICAL EXAM      Date: 08/05/2016  Patient Name: [place] [name]    History of Presenting Illness     Chief Complaint   Patient presents with   ??? Chest Pain     left sided down his left arm. Dialysis patient, last time was Friday   ??? Diarrhea     monday   ??? Vomiting     monday       History Provided By: Patient    HPI: [place] [name], 46 y.o. male with PMHx significant for ESRD (dialysis M/W/F), C-diff colitis, HTN, and asthma, presents ambulatory to the ED with cc of mild left sided CP noticed upon waking at 0500 this morning with associated SOB. Additionally, pt is complaining of moderate abdominal pain with associated diarrhea, nausea, and vomiting; pt describes the diarrhea as being similar to his hx of C-diff colitis with ~7 episodes this morning. He was discharged from [facility] on 07/06/16 following treatment for C-diff, noting he was treated with Vancomycin for 14 days, completed 1 week ago. He states that the diarrhea had initially improved and that his stool was starting to return to baseline, however, he states that the diarrhea started to return several days ago and is similar to his prior diagnosis of C-diff. Pt states that he has not attended dialysis since last week (07/30/16, almost 1 week ago). He has a hx of noncompliance with medical treatment and attending dialysis. Pt has a hx of admission for treatment regarding c-diff in the setting of the same symptoms and has signed out AMA twice after further IV pain medications were declined. Furthermore, chart review shows pt has been fired from dialysis centers due to missing his dialysis appointments. Pt notes additional symptom of subjective fever this morning but otherwise denies further symptoms or complaints.     PCP: Phys Other, MD    There are no other complaints, changes, or physical findings at this time.    Current Outpatient Prescriptions   Medication Sig Dispense Refill    ??? oxyCODONE-acetaminophen (PERCOCET) 5-325 mg per tablet Take 1 Tab by mouth every four (4) hours as needed for Pain. Max Daily Amount: 6 Tabs. 8 Tab 0   ??? ASPIRIN/SALICYLAMIDE/CAFFEINE (BC HEADACHE POWDER PO) Take  by mouth as needed for Other (headache).     ??? albuterol (PROVENTIL HFA, VENTOLIN HFA, PROAIR HFA) 90 mcg/actuation inhaler Take 2 Puffs by inhalation every four (4) hours as needed for Wheezing. 1 Inhaler 1   ??? sevelamer (RENAGEL) 400 mg tablet Take 800 mg by mouth three (3) times daily (with meals).     ??? carvedilol (COREG) 12.5 mg tablet Take  by mouth two (2) times daily (with meals).     ??? hydrALAZINE (APRESOLINE) 100 mg tablet Take 100 mg by mouth three (3) times daily.     ??? amLODIPine (NORVASC) 10 mg tablet Take 10 mg by mouth daily.     ??? furosemide (LASIX) 80 mg tablet Take 80 mg by mouth daily.     ??? albuterol (PROVENTIL HFA, VENTOLIN HFA, PROAIR HFA) 90 mcg/actuation inhaler Take 2 Puffs by inhalation every four (4) hours as needed for Wheezing.         Past History     Past Medical History:  Past Medical History:   Diagnosis Date   ??? Adverse effect of anesthesia     sleep apnea uses oxygen at night    ??? Ascites    ??? Asthma    ???  C. difficile colitis    ??? CKD (chronic kidney disease) stage V requiring chronic dialysis ([place])    ??? HTN (hypertension)    ??? SBP (spontaneous bacterial peritonitis) [facility])        Past Surgical History:  Past Surgical History:   Procedure Laterality Date   ??? COLONOSCOPY N/A 03/04/2016    COLONOSCOPY performed by Macie Burows, MD at [facility]   ??? HX HERNIA REPAIR  2008    right inguinal and umbilical   ??? SIGMOIDOSCOPY,DIAGNOSTIC  03/04/2016            Family History:  Family History   Problem Relation Age of Onset   ??? Hypertension Mother        Social History:  Social History   Substance Use Topics   ??? Smoking status: Current Every Day Smoker     Packs/day: 0.50     Years: 10.00   ??? Smokeless tobacco: Never Used   ??? Alcohol use No       Allergies:  Allergies    Allergen Reactions   ??? Lisinopril Shortness of Breath   ??? Morphine Shortness of Breath   ??? Toradol [Ketorolac] Shortness of Breath         Review of Systems   Review of Systems   Constitutional: Positive for fever. Negative for appetite change, chills and fatigue.   HENT: Negative.  Negative for congestion, rhinorrhea, sinus pressure and sore throat.    Eyes: Negative.    Respiratory: Positive for shortness of breath. Negative for cough, choking, chest tightness and wheezing.    Cardiovascular: Positive for chest pain. Negative for palpitations and leg swelling.   Gastrointestinal: Positive for abdominal pain, diarrhea, nausea and vomiting. Negative for constipation.   Endocrine: Negative.    Genitourinary: Negative.  Negative for difficulty urinating, dysuria, flank pain and urgency.   Musculoskeletal: Negative.    Skin: Negative.    Neurological: Negative.  Negative for dizziness, speech difficulty, weakness, light-headedness, numbness and headaches.   Psychiatric/Behavioral: Negative.    All other systems reviewed and are negative.      Physical Exam   Physical Exam   Constitutional: He is oriented to person, place, and time. He appears well-developed and well-nourished.   Pt in no acute distress   HENT:   Head: Normocephalic and atraumatic.   Mouth/Throat: Oropharynx is clear and moist. No oropharyngeal exudate.   Eyes: Conjunctivae and EOM are normal. Pupils are equal, round, and reactive to light.   Neck: Normal range of motion. Neck supple. No JVD present. No tracheal deviation present.   Cardiovascular: Normal rate, regular rhythm, normal heart sounds and intact distal pulses.    No murmur heard.  Pulmonary/Chest: Effort normal and breath sounds normal. No stridor. No respiratory distress. He has no wheezes. He has no rales. He exhibits no tenderness.   Abdominal: Soft. He exhibits no distension. There is no tenderness. There is no rebound and no guarding.    Musculoskeletal: Normal range of motion. He exhibits edema (Bil lower legs). He exhibits no tenderness.   Neurological: He is alert and oriented to person, place, and time. No cranial nerve deficit.   No gross motor or sensory deficits    Skin: Skin is warm and dry. He is not diaphoretic.   Psychiatric: He has a normal mood and affect. His behavior is normal.   Nursing note and vitals reviewed.        Diagnostic Study Results     Labs -  Recent Results (from the past 12 hour(s))   EKG, 12 LEAD, INITIAL    Collection Time: 08/05/16  7:58 AM   Result Value Ref Range    Ventricular Rate 101 BPM    Atrial Rate 101 BPM    P-R Interval 172 ms    QRS Duration 78 ms    Q-T Interval 372 ms    QTC Calculation (Bezet) 482 ms    Calculated P Axis 59 degrees    Calculated R Axis -19 degrees    Calculated T Axis 86 degrees    Diagnosis       Sinus tachycardia  Possible Left atrial enlargement  When compared with ECG of 21-Jul-2016 03:11,  No significant change was found     CBC WITH AUTOMATED DIFF    Collection Time: 08/05/16  8:36 AM   Result Value Ref Range    WBC 6.3 4.1 - 11.1 K/uL    RBC 2.64 (L) 4.10 - 5.70 M/uL    HGB 8.2 (L) 12.1 - 17.0 g/dL    HCT 25.9 (L) 36.6 - 50.3 %    MCV 98.1 80.0 - 99.0 FL    MCH 31.1 26.0 - 34.0 PG    MCHC 31.7 30.0 - 36.5 g/dL    RDW 17.5 (H) 11.5 - 14.5 %    PLATELET 108 (L) 150 - 400 K/uL    MPV 10.3 8.9 - 12.9 FL    NRBC 0.0 0 PER 100 WBC    ABSOLUTE NRBC 0.00 0.00 - 0.01 K/uL    NEUTROPHILS 80 (H) 32 - 75 %    LYMPHOCYTES 8 (L) 12 - 49 %    MONOCYTES 8 5 - 13 %    EOSINOPHILS 4 0 - 7 %    BASOPHILS 0 0 - 1 %    IMMATURE GRANULOCYTES 0 0.0 - 0.5 %    ABS. NEUTROPHILS 5.0 1.8 - 8.0 K/UL    ABS. LYMPHOCYTES 0.5 (L) 0.8 - 3.5 K/UL    ABS. MONOCYTES 0.5 0.0 - 1.0 K/UL    ABS. EOSINOPHILS 0.3 0.0 - 0.4 K/UL    ABS. BASOPHILS 0.0 0.0 - 0.1 K/UL    ABS. IMM. GRANS. 0.0 0.00 - 0.04 K/UL    DF SMEAR SCANNED      RBC COMMENTS NORMOCYTIC, NORMOCHROMIC     METABOLIC PANEL, COMPREHENSIVE     Collection Time: 08/05/16  8:36 AM   Result Value Ref Range    Sodium 144 136 - 145 mmol/L    Potassium 4.8 3.5 - 5.1 mmol/L    Chloride 112 (H) 97 - 108 mmol/L    CO2 21 21 - 32 mmol/L    Anion gap 11 5 - 15 mmol/L    Glucose 101 (H) 65 - 100 mg/dL    BUN 56 (H) 6 - 20 MG/DL    Creatinine 7.88 (H) 0.70 - 1.30 MG/DL    BUN/Creatinine ratio 7 (L) 12 - 20      GFR est AA 9 (L) >60 ml/min/1.80m    GFR est non-AA 7 (L) >60 ml/min/1.765m   Calcium 7.2 (L) 8.5 - 10.1 MG/DL    Bilirubin, total 0.4 0.2 - 1.0 MG/DL    ALT (SGPT) 28 12 - 78 U/L    AST (SGOT) 22 15 - 37 U/L    Alk. phosphatase 255 (H) 45 - 117 U/L    Protein, total 6.6 6.4 - 8.2 g/dL    Albumin 3.0 (L) 3.5 - 5.0 g/dL  Globulin 3.6 2.0 - 4.0 g/dL    A-G Ratio 0.8 (L) 1.1 - 2.2     MAGNESIUM    Collection Time: 08/05/16  8:36 AM   Result Value Ref Range    Magnesium 2.3 1.6 - 2.4 mg/dL   PHOSPHORUS    Collection Time: 08/05/16  8:36 AM   Result Value Ref Range    Phosphorus 3.0 2.6 - 4.7 MG/DL   LACTIC ACID    Collection Time: 08/05/16  8:36 AM   Result Value Ref Range    Lactic acid 0.9 0.4 - 2.0 MMOL/L       Radiologic Studies -   CXR Results  (Last 48 hours)               08/05/16 1057  XR ABD ACUTE W 1 V CHEST Final result    Impression:  IMPRESSION:    1. The lungs are clear.   2. No obstruction or free air.                                   Narrative:  EXAM:  XR ABD ACUTE W 1 V CHEST       INDICATION:  Abdominal Pain       COMPARISON: Chest 07/21/2016.       FINDINGS: The upright chest radiograph demonstrates increased interstitial   markings at the right lung base unchanged. No other evidence of acute findings   seen. Heart size is slightly smaller than on the previous exam. There is multi   lumen catheter overlying the thorax. Stent material overlies the right apex..   There is no pleural effusion or free air under the diaphragm.       Upright only views of the abdomen demonstrate a nonobstructive bowel gas    pattern. There is no free intraperitoneal air. No soft tissue masses or   pathologic calcifications are identified. The bones are within normal limits.                   Medical Decision Making   I am the first provider for this patient.    I reviewed the vital signs, available nursing notes, past medical history, past surgical history, family history and social history.    Vital Signs-Reviewed the patient's vital signs.  Patient Vitals for the past 12 hrs:   Temp Pulse Resp BP SpO2   08/05/16 1307 - (!) 109 20 - (!) 71 %   08/05/16 1303 - (!) 110 22 - (!) 88 %   08/05/16 1219 - 98 21 - 99 %   08/05/16 1215 - 97 25 (!) 160/99 99 %   08/05/16 1142 - (!) 104 21 (!) 185/97 100 %   08/05/16 1120 - (!) 101 21 - 99 %   08/05/16 1115 - (!) 101 25 178/87 100 %   08/05/16 1103 - 99 26 158/83 99 %   08/05/16 1026 - 99 25 - 100 %   08/05/16 1015 - 96 13 (!) 194/101 100 %   08/05/16 0959 - 93 21 - 100 %   08/05/16 0929 - 94 23 - 100 %   08/05/16 0915 - (!) 102 25 (!) 173/96 100 %   08/05/16 0845 - (!) 104 27 170/90 100 %   08/05/16 0822 - (!) 104 21 - 99 %   08/05/16 0815 - (!) 108 21 187/89 100 %   08/05/16  0813 - (!) 109 21 - 100 %   08/05/16 0810 - (!) 104 25 - 100 %   08/05/16 0805 - - - 172/90 99 %   08/05/16 0752 98.3 ??F (36.8 ??C) (!) 103 20 (!) 192/102 96 %       Records Reviewed: Nursing Notes and Old Medical Records    Provider Notes (Medical Decision Making):   DDx: Malingering, noncompliance, C-diff, electrolyte abnormality, dehydration     ED Course:   Initial assessment performed. The patients presenting problems have been discussed, and they are in agreement with the care plan formulated and outlined with them.  I have encouraged them to ask questions as they arise throughout their visit.    CONSULT NOTE:  12:17 PM  Rada Hay, DO spoke with Chauncey Mann, MD,  Specialty: GI  Discussed pt's hx, disposition, and available diagnostic and imaging  results. Reviewed care plans. Consultant recommends discharge home with instructions to follow up in the office next week with Dr. [name]. Pt does not need another round of Vancomycin at this time. Pt not having any diarrhea, no fever, no leukocytosis.     CONSULT NOTE:  1:13 PM  Rada Hay, DO spoke with Rosalia Hammers, M.D.,  Specialty: Nephrology  Discussed pt's hx, disposition, and available diagnostic and imaging results. Reviewed care plans. Consultant recommends discharging the pt home. He states that the pt is  noncompliant with regards to his health management. He states they have instructed him that he needs to find a dialysis center for continued management and dialysis treatments. If his potassium is stable, he can be discharged and needs to follow up with dialysis as instructed.     Pt has been in [place] area for prolonged time, he has had multiple admx at area hospitals but has yet to follow up and begin dialysis treatments here locally. At this time pt dos not require emergent dialysis. I do not feel the pt has an active infection. Pt will be discharged home.    1:43 PM  I have reviewed discharge instructions with the patient and explained medications with which he is being discharged. The patient verbalized understanding and agrees with plan.    Disposition:  Discharge Note:  1:43 PM  The patient has been re-evaluated and is ready for discharge. Reviewed available results with patient. Counseled patient on diagnosis and care plan. Patient has expressed understanding, and all questions have been answered. Patient agrees with plan and agrees to follow up as recommended, or return to the ED if their symptoms worsen. Discharge instructions have been provided and explained to the patient, along with reasons to return to the ED.    PLAN:  1.   Discharge Medication List as of 08/05/2016  1:34 PM      START taking these medications    Details    oxyCODONE-acetaminophen (PERCOCET) 5-325 mg per tablet Take 1 Tab by mouth every four (4) hours as needed for Pain. Max Daily Amount: 6 Tabs., Print, Disp-8 Tab, R-0         CONTINUE these medications which have NOT CHANGED    Details   ASPIRIN/SALICYLAMIDE/CAFFEINE (BC HEADACHE POWDER PO) Take  by mouth as needed for Other (headache)., Historical Med      !! albuterol (PROVENTIL HFA, VENTOLIN HFA, PROAIR HFA) 90 mcg/actuation inhaler Take 2 Puffs by inhalation every four (4) hours as needed for Wheezing., Print, Disp-1 Inhaler, R-1      sevelamer (RENAGEL) 400 mg tablet  Take 800 mg by mouth three (3) times daily (with meals)., Historical Med      carvedilol (COREG) 12.5 mg tablet Take  by mouth two (2) times daily (with meals)., Historical Med      hydrALAZINE (APRESOLINE) 100 mg tablet Take 100 mg by mouth three (3) times daily., Historical Med      amLODIPine (NORVASC) 10 mg tablet Take 10 mg by mouth daily., Historical Med      furosemide (LASIX) 80 mg tablet Take 80 mg by mouth daily., Historical Med      !! albuterol (PROVENTIL HFA, VENTOLIN HFA, PROAIR HFA) 90 mcg/actuation inhaler Take 2 Puffs by inhalation every four (4) hours as needed for Wheezing., Historical Med       !! - Potential duplicate medications found. Please discuss with provider.        2.   Follow-up Information     Follow up With Details Comments Contact Info    Phys Other, MD   Patient can only remember the practice name and not the physician      Regenia Skeeter, MD   [address]  [place]  [place] [address]  [place], MD   [address]  [address]  [place] [address]  [phone]          Return to ED if worse     Diagnosis     Clinical Impression:   1. Abdominal pain, generalized    2. Chest pain, unspecified type    3. ESRD on dialysis [facility])        Attestations:    This note is prepared by Baldwin Crown, acting as Scribe for Rada Hay, [place], DO: The scribe's documentation has been prepared under my direction and personally reviewed by me in its entirety. I confirm that the note above accurately reflects all work, treatment, procedures, and medical decision making performed by me.

## 2016-08-05 NOTE — ED Notes (Signed)
Pt back from imaging.  Notified MD of pt wanting anti-nausea and pain meds.

## 2016-08-05 NOTE — ED Notes (Signed)
Pt asleep.

## 2016-08-05 NOTE — ED Notes (Signed)
MD at bedside

## 2016-08-05 NOTE — ED Notes (Signed)
Pt states he missed two days of dialysis due to the diarrhea and vomiting. Has not take his HTN medications since Monday

## 2016-08-05 NOTE — ED Notes (Signed)
Pt sleeping.  Pharmacy tech attempted to go through medications with pt but pt kept falling asleep.

## 2016-08-05 NOTE — ED Notes (Signed)
Pt transported to imaging.

## 2016-08-14 ENCOUNTER — Inpatient Hospital Stay
Admit: 2016-08-14 | Discharge: 2016-08-14 | Disposition: A | Discharge status: Home / Self Care | Payer: MEDICARE | Attending: Emergency Medicine

## 2016-08-14 DIAGNOSIS — N179 Acute kidney failure, unspecified: Secondary | ICD-10-CM

## 2016-08-14 LAB — METABOLIC PANEL, COMPREHENSIVE
A-G Ratio: 0.8 — ABNORMAL LOW (ref 1.1–2.2)
ALT (SGPT): 30 U/L (ref 12–78)
AST (SGOT): 22 U/L (ref 15–37)
Albumin: 3 g/dL — ABNORMAL LOW (ref 3.5–5.0)
Alk. phosphatase: 270 U/L — ABNORMAL HIGH (ref 45–117)
Anion gap: 6 mmol/L (ref 5–15)
BUN/Creatinine ratio: 6 — ABNORMAL LOW (ref 12–20)
BUN: 35 MG/DL — ABNORMAL HIGH (ref 6–20)
Bilirubin, total: 0.5 MG/DL (ref 0.2–1.0)
CO2: 25 mmol/L (ref 21–32)
Calcium: 7.8 MG/DL — ABNORMAL LOW (ref 8.5–10.1)
Chloride: 111 mmol/L — ABNORMAL HIGH (ref 97–108)
Creatinine: 5.88 MG/DL — ABNORMAL HIGH (ref 0.70–1.30)
GFR est AA: 13 mL/min/{1.73_m2} — ABNORMAL LOW (ref >60)
GFR est non-AA: 10 mL/min/{1.73_m2} — ABNORMAL LOW (ref >60)
Globulin: 3.6 g/dL (ref 2.0–4.0)
Glucose: 84 mg/dL (ref 65–100)
Potassium: 4.5 mmol/L (ref 3.5–5.1)
Protein, total: 6.6 g/dL (ref 6.4–8.2)
Sodium: 142 mmol/L (ref 136–145)

## 2016-08-14 LAB — CBC WITH AUTOMATED DIFF
ABS. BASOPHILS: 0 10*3/uL (ref 0.0–0.1)
ABS. EOSINOPHILS: 0.2 10*3/uL (ref 0.0–0.4)
ABS. IMM. GRANS.: 0 10*3/uL (ref 0.00–0.04)
ABS. LYMPHOCYTES: 0.5 10*3/uL — ABNORMAL LOW (ref 0.8–3.5)
ABS. MONOCYTES: 0.4 10*3/uL (ref 0.0–1.0)
ABS. NEUTROPHILS: 3.9 10*3/uL (ref 1.8–8.0)
ABSOLUTE NRBC: 0 10*3/uL (ref 0.00–0.01)
BASOPHILS: 0 % (ref 0–1)
EOSINOPHILS: 4 % (ref 0–7)
HCT: 29.1 % — ABNORMAL LOW (ref 36.6–50.3)
HGB: 9 g/dL — ABNORMAL LOW (ref 12.1–17.0)
IMMATURE GRANULOCYTES: 0 % (ref 0.0–0.5)
LYMPHOCYTES: 10 % — ABNORMAL LOW (ref 12–49)
MCH: 30.5 PG (ref 26.0–34.0)
MCHC: 30.9 g/dL (ref 30.0–36.5)
MCV: 98.6 FL (ref 80.0–99.0)
MONOCYTES: 7 % (ref 5–13)
MPV: 9.7 FL (ref 8.9–12.9)
NEUTROPHILS: 79 % — ABNORMAL HIGH (ref 32–75)
NRBC: 0 PER 100 WBC
PLATELET: 113 10*3/uL — ABNORMAL LOW (ref 150–400)
RBC: 2.95 M/uL — ABNORMAL LOW (ref 4.10–5.70)
RDW: 18.5 % — ABNORMAL HIGH (ref 11.5–14.5)
WBC: 5 10*3/uL (ref 4.1–11.1)

## 2016-08-14 LAB — POC INR: INR (POC): 1.2 — ABNORMAL HIGH (ref <1.2)

## 2016-08-14 LAB — MAGNESIUM: Magnesium: 2.1 mg/dL (ref 1.6–2.4)

## 2016-08-14 LAB — TROPONIN I: Troponin-I, Qt.: 0.04 ng/mL (ref <0.05)

## 2016-08-14 MED ORDER — SODIUM CHLORIDE 0.9 % IJ SYRG
Freq: Three times a day (TID) | INTRAMUSCULAR | Status: DC
Start: 2016-08-14 — End: 2016-08-14

## 2016-08-14 MED ORDER — ONDANSETRON (PF) 4 MG/2 ML INJECTION
4 mg/2 mL | INTRAMUSCULAR | Status: AC
Start: 2016-08-14 — End: 2016-08-14
  Administered 2016-08-14: 14:00:00 via INTRAVENOUS

## 2016-08-14 MED ORDER — FENTANYL CITRATE (PF) 50 MCG/ML IJ SOLN
50 mcg/mL | INTRAMUSCULAR | Status: AC
Start: 2016-08-14 — End: 2016-08-14
  Administered 2016-08-14: 14:00:00 via INTRAVENOUS

## 2016-08-14 MED ORDER — SODIUM CHLORIDE 0.9 % IJ SYRG
INTRAMUSCULAR | Status: DC | PRN
Start: 2016-08-14 — End: 2016-08-14

## 2016-08-14 MED FILL — ONDANSETRON (PF) 4 MG/2 ML INJECTION: 4 mg/2 mL | INTRAMUSCULAR | Qty: 2

## 2016-08-14 MED FILL — FENTANYL CITRATE (PF) 50 MCG/ML IJ SOLN: 50 mcg/mL | INTRAMUSCULAR | Qty: 2

## 2016-08-14 NOTE — ED Notes (Signed)
Case management speaking with patient.

## 2016-08-14 NOTE — ED Notes (Signed)
MD at bedside.

## 2016-08-14 NOTE — ED Provider Notes (Signed)
EMERGENCY DEPARTMENT HISTORY AND PHYSICAL EXAM      Date: 08/14/2016  Patient Name: David [name]    History of Presenting Illness     Chief Complaint   Patient presents with   ??? Chest Pain       History Provided By: Patient and EMS    HPI: David [name], 46 y.o. male with PMHx significant for CKD, ESRD, HTN, and ascites, presents via EMS from [facility] for to the ED with cc of persistent mild to moderate chest pain, SOB, and abdominal pain with associated nausea and vomiting. Per EMS, pt is a MWF dialysis pt, however was last dialyzed on Monday. He states he was discharged from [facility] secondary to noncompliance. Per EMS, reason for transfer is dialysis. They note his troponin was 0.5 at [facility]. Per chart review, pt was admitted to [facility] on 07/14/2015 for c.diff and is currently on contact precaution. He specifically denies any fevers, chills, headache, rash, diarrhea, sweating or weight loss.    There are no other complaints, changes, or physical findings at this time.    PCP: Phys Other, MD    Current Facility-Administered Medications   Medication Dose Route Frequency Provider Last Rate Last Dose   ??? sodium chloride (NS) flush 5-10 mL  5-10 mL IntraVENous Q8H Elder Cyphers, MD       ??? sodium chloride (NS) flush 5-10 mL  5-10 mL IntraVENous PRN Elder Cyphers, MD         Current Outpatient Prescriptions   Medication Sig Dispense Refill   ??? ASPIRIN/SALICYLAMIDE/CAFFEINE (BC HEADACHE POWDER PO) Take  by mouth as needed for Other (headache).     ??? albuterol (PROVENTIL HFA, VENTOLIN HFA, PROAIR HFA) 90 mcg/actuation inhaler Take 2 Puffs by inhalation every four (4) hours as needed for Wheezing. 1 Inhaler 1   ??? sevelamer (RENAGEL) 400 mg tablet Take 800 mg by mouth three (3) times daily (with meals).     ??? carvedilol (COREG) 12.5 mg tablet Take  by mouth two (2) times daily (with meals).     ??? hydrALAZINE (APRESOLINE) 100 mg tablet Take 100 mg by mouth three (3)  times daily.     ??? amLODIPine (NORVASC) 10 mg tablet Take 10 mg by mouth daily.     ??? furosemide (LASIX) 80 mg tablet Take 80 mg by mouth daily.     ??? albuterol (PROVENTIL HFA, VENTOLIN HFA, PROAIR HFA) 90 mcg/actuation inhaler Take 2 Puffs by inhalation every four (4) hours as needed for Wheezing.         Past History     Past Medical History:  Past Medical History:   Diagnosis Date   ??? Adverse effect of anesthesia     sleep apnea uses oxygen at night    ??? Ascites    ??? Asthma    ??? C. difficile colitis    ??? CKD (chronic kidney disease) stage V requiring chronic dialysis ([facility])    ??? HTN (hypertension)    ??? SBP (spontaneous bacterial peritonitis) [facility])        Past Surgical History:  Past Surgical History:   Procedure Laterality Date   ??? COLONOSCOPY N/A 03/04/2016    COLONOSCOPY performed by Macie Burows, MD at [facility]   ??? HX HERNIA REPAIR  2008    right inguinal and umbilical   ??? SIGMOIDOSCOPY,DIAGNOSTIC  03/04/2016            Family History:  Family History  Problem Relation Age of Onset   ??? Hypertension Mother        Social History:  Social History   Substance Use Topics   ??? Smoking status: Current Every Day Smoker     Packs/day: 0.50     Years: 10.00   ??? Smokeless tobacco: Never Used   ??? Alcohol use No       Allergies:  Allergies   Allergen Reactions   ??? Lisinopril Shortness of Breath   ??? Morphine Shortness of Breath   ??? Toradol [Ketorolac] Shortness of Breath         Review of Systems   Review of Systems   Constitutional: Negative.  Negative for activity change, appetite change, chills, diaphoresis, fatigue, fever and unexpected weight change.   HENT: Negative.  Negative for congestion, hearing loss, rhinorrhea, sneezing and voice change.    Eyes: Negative.  Negative for pain and visual disturbance.   Respiratory: Positive for shortness of breath. Negative for apnea, cough, choking and chest tightness.    Cardiovascular: Positive for chest pain. Negative for palpitations.    Gastrointestinal: Positive for abdominal pain, nausea and vomiting. Negative for abdominal distention, blood in stool and diarrhea.   Genitourinary: Negative.  Negative for difficulty urinating, flank pain, frequency and urgency.        No discharge   Musculoskeletal: Negative.  Negative for arthralgias, back pain, myalgias and neck stiffness.   Skin: Negative.  Negative for color change and rash.   Neurological: Negative.  Negative for dizziness, seizures, syncope, speech difficulty, weakness, numbness and headaches.   Hematological: Negative for adenopathy.   Psychiatric/Behavioral: Negative.  Negative for agitation, behavioral problems, dysphoric mood and suicidal ideas. The patient is not nervous/anxious.        Physical Exam   Physical Exam   Constitutional: He is oriented to person, place, and time. He appears well-developed and well-nourished. No distress.   HENT:   Head: Normocephalic and atraumatic.   Mouth/Throat: Oropharynx is clear and moist. No oropharyngeal exudate.   Eyes: Conjunctivae and EOM are normal. Pupils are equal, round, and reactive to light. Right eye exhibits no discharge. Left eye exhibits no discharge.   Neck: Normal range of motion. Neck supple.   Cardiovascular: Regular rhythm and intact distal pulses.  Tachycardia present.  Exam reveals no gallop and no friction rub.    No murmur heard.  Pulmonary/Chest: Breath sounds normal. No respiratory distress. He has no wheezes. He has no rales. He exhibits no tenderness.   Mild increased work of breathing. [place] dialysis access left chest.    Abdominal: Soft. Bowel sounds are normal. He exhibits no distension and no mass. There is no rebound.   Mild tenderness across abdomen with voluntary guarding.    Musculoskeletal: Normal range of motion. He exhibits no edema.   No appreciable thrill over fistula LUE. No appreciable thrill over fistulas RUE.    Lymphadenopathy:     He has no cervical adenopathy.    Neurological: He is alert and oriented to person, place, and time. No cranial nerve deficit. Coordination normal.   Skin: Skin is warm and dry. No rash noted. No erythema.   Psychiatric: He has a normal mood and affect.   Nursing note and vitals reviewed.        Diagnostic Study Results     Labs -     Recent Results (from the past 12 hour(s))   EKG, 12 LEAD, INITIAL    Collection Time: 08/14/16  8:23 AM  Result Value Ref Range    Ventricular Rate 90 BPM    Atrial Rate 90 BPM    P-R Interval 190 ms    QRS Duration 86 ms    Q-T Interval 400 ms    QTC Calculation (Bezet) 489 ms    Calculated P Axis 67 degrees    Calculated R Axis -30 degrees    Calculated T Axis 82 degrees    Diagnosis       Normal sinus rhythm  Possible Left atrial enlargement  Left axis deviation  Prolonged QT  When compared with ECG of 05-Aug-2016 07:58,  No significant change was found     POC INR    Collection Time: 08/14/16  8:25 AM   Result Value Ref Range    INR (POC) 1.2 (H) <1.2     CBC WITH AUTOMATED DIFF    Collection Time: 08/14/16  8:26 AM   Result Value Ref Range    WBC 5.0 4.1 - 11.1 K/uL    RBC 2.95 (L) 4.10 - 5.70 M/uL    HGB 9.0 (L) 12.1 - 17.0 g/dL    HCT 29.1 (L) 36.6 - 50.3 %    MCV 98.6 80.0 - 99.0 FL    MCH 30.5 26.0 - 34.0 PG    MCHC 30.9 30.0 - 36.5 g/dL    RDW 18.5 (H) 11.5 - 14.5 %    PLATELET 113 (L) 150 - 400 K/uL    MPV 9.7 8.9 - 12.9 FL    NRBC 0.0 0 PER 100 WBC    ABSOLUTE NRBC 0.00 0.00 - 0.01 K/uL    NEUTROPHILS 79 (H) 32 - 75 %    LYMPHOCYTES 10 (L) 12 - 49 %    MONOCYTES 7 5 - 13 %    EOSINOPHILS 4 0 - 7 %    BASOPHILS 0 0 - 1 %    IMMATURE GRANULOCYTES 0 0.0 - 0.5 %    ABS. NEUTROPHILS 3.9 1.8 - 8.0 K/UL    ABS. LYMPHOCYTES 0.5 (L) 0.8 - 3.5 K/UL    ABS. MONOCYTES 0.4 0.0 - 1.0 K/UL    ABS. EOSINOPHILS 0.2 0.0 - 0.4 K/UL    ABS. BASOPHILS 0.0 0.0 - 0.1 K/UL    ABS. IMM. GRANS. 0.0 0.00 - 0.04 K/UL    DF SMEAR SCANNED      RBC COMMENTS ANISOCYTOSIS  1+       METABOLIC PANEL, COMPREHENSIVE     Collection Time: 08/14/16  8:26 AM   Result Value Ref Range    Sodium 142 136 - 145 mmol/L    Potassium 4.5 3.5 - 5.1 mmol/L    Chloride 111 (H) 97 - 108 mmol/L    CO2 25 21 - 32 mmol/L    Anion gap 6 5 - 15 mmol/L    Glucose 84 65 - 100 mg/dL    BUN 35 (H) 6 - 20 MG/DL    Creatinine 5.88 (H) 0.70 - 1.30 MG/DL    BUN/Creatinine ratio 6 (L) 12 - 20      GFR est AA 13 (L) >60 ml/min/1.58m    GFR est non-AA 10 (L) >60 ml/min/1.764m   Calcium 7.8 (L) 8.5 - 10.1 MG/DL    Bilirubin, total 0.5 0.2 - 1.0 MG/DL    ALT (SGPT) 30 12 - 78 U/L    AST (SGOT) 22 15 - 37 U/L    Alk. phosphatase 270 (H) 45 - 117 U/L    Protein, total  6.6 6.4 - 8.2 g/dL    Albumin 3.0 (L) 3.5 - 5.0 g/dL    Globulin 3.6 2.0 - 4.0 g/dL    A-G Ratio 0.8 (L) 1.1 - 2.2     MAGNESIUM    Collection Time: 08/14/16  8:26 AM   Result Value Ref Range    Magnesium 2.1 1.6 - 2.4 mg/dL   TROPONIN I    Collection Time: 08/14/16  8:26 AM   Result Value Ref Range    Troponin-I, Qt. 0.04 <0.05 ng/mL       Medical Decision Making   I am the first provider for this patient.    I reviewed the vital signs, available nursing notes, past medical history, past surgical history, family history and social history.    Vital Signs-Reviewed the patient's vital signs.  Patient Vitals for the past 12 hrs:   Temp Pulse Resp BP SpO2   08/14/16 1015 - 94 18 (!) 159/102 100 %   08/14/16 0945 - 84 16 (!) 211/106 98 %   08/14/16 0822 98.2 ??F (36.8 ??C) 92 18 (!) 178/118 100 %       Pulse Oximetry Analysis - 100% on RA    Cardiac Monitor:   Rate: 92 bpm  Rhythm: Normal Sinus Rhythm      EKG interpretation: (Preliminary)  0823  Rhythm: normal sinus rhythm; and regular . Rate (approx.): 90; Axis: normal; PR interval: 190; QRS interval: 86; ST/T wave: normal.  Written by Dickey Gave, ED Scribe, as dictated by Serafina Mitchell. Zack Seal, MD.    Records Reviewed: Nursing Notes, Old Medical Records, Previous electrocardiograms, Ambulance Run Sheet, Previous Radiology Studies and  Previous Laboratory Studies    Provider Notes (Medical Decision Making):   DDx: ACS, arrhythmia, volume overload, electrolyte abnormality    ED Course:   Initial assessment performed. The patients presenting problems have been discussed, and they are in agreement with the care plan formulated and outlined with them.  I have encouraged them to ask questions as they arise throughout their visit.    8:24 AM  I reviewed our electronic medical record system for any past medical records that were available that may contribute to the patients current condition. Pt's CXR shows no overt pulmonary edema.   Elder Cyphers, MD     CONSULT NOTE:   10:10 AM  Serafina Mitchell. Zack Seal, MD spoke with Stark Jock, MD   Specialty: nephrology  Discussed pt's hx, disposition, and available diagnostic and imaging results. Reviewed care plans. Consultant agrees with plans as outlined.  Dr. Loel Dubonnet states pt does not need to be dialyzed. She notes he has a hx of noncompliance; he frequently leaves before being dialyzed and requesting narcotics.   Written by Dickey Gave, ED Scribe, as dictated by Serafina Mitchell. Zack Seal, MD.    PROGRESS NOTE:  10:13 AM  Pt has had no evidence of diarrhea since he has been here. His troponin is not elevated and EKG is unchanged. Will give a list of PCP to f/u with. His CXR does not show overt pulmonary edema.  Written by Dickey Gave, ED Scribe, as dictated by Serafina Mitchell. Zack Seal, MD.    Disposition:    10:14 AM  Lelon Frohlich [facility]'s  results have been reviewed with him.  He has been counseled regarding his diagnosis.  He verbally conveys understanding and agreement of the signs, symptoms, diagnosis, treatment and prognosis and additionally agrees to follow up as recommended with Dr. Oris Drone Other, MD in 24 - 48  hours.  He also agrees with the care-plan and conveys that all of his questions have been answered.  I have also put together some discharge instructions for him that include: 1) educational information regarding  their diagnosis, 2) how to care for their diagnosis at home, as well a 3) list of reasons why they would want to return to the ED prior to their follow-up appointment, should their condition change.    PLAN:  1. Discharge home  2.   Follow-up Information     Follow up With Details Comments Contact Info    see list of providers Call today          Return to ED if worse     Diagnosis     Clinical Impression:   1. Acute renal failure, unspecified acute renal failure type [facility])        Attestations:    This note is prepared by Dickey Gave, acting as Scribe for [employer]. Zack Seal, [place] Zack Seal, MD: The scribe's documentation has been prepared under my direction and personally reviewed by me in its entirety. I confirm that the note above accurately reflects all work, treatment, procedures, and medical decision making performed by me.          This note will not be viewable in [place].

## 2016-08-14 NOTE — ED Notes (Signed)
Case management and charge nurse at bedside.

## 2016-08-14 NOTE — ED Notes (Signed)
Patient placed on monitor x 3. Patient is ANO x 3. Patient complaining of chest pain, and abdominal pain, last BM this morning. Call bell in reach.

## 2016-08-14 NOTE — ED Notes (Signed)
Pt up ambulating around in room without oxygen and has gotten himself dressed.

## 2016-08-14 NOTE — Progress Notes (Signed)
Pt is a 46 y/o African-American male reporting to the ER for chest pain. Pt lives alone in a one story home with no steps to the entrance of the residence. Pt is independent with ADLs at baseline to include driving. Pt has no previous [facility] provider. Pt has previous inpatient rehab with [facility]. Pt wears oxygen (3-4 Liters) pt reports he uses the oxygen PRN. Pt is currently 95% on room air while ambulating. Pt prefers [facility] in [place], [place]. Pt is stranded at ER and doesn't have transportation to [place] at this time.     CM spoke with pt and he advised his vehicle is in [place] but he resides in [place], [place]. Pt was visiting his sister and fell ill then went to ER. Pt sister is now out of town and not at her residence. Pt wears oxygen, PRN but it is in his car in [employer].     CM contacted liaison with [facility], Leriene, she advised [facility] is unable to transport pt back to hospital unless he was being seen in the ER. They are able to transport pt home. Pt stated he lives in [place] and needs to get to his car.  Pt doesn't know his sisters address to have [facility] transport him to her residence. Pt states even if he gets to her residence he has no money to get to his car from her residence. Pt stated he has no money until his pay day next week.     CM contacted yellow cab and was advised the price is $5 the first mile and $2.50 each additional mile. CM goggled address and estimated 48 miles. Cab vouchers from [facility] are good for 25 - 30 miles. CM inquired if pt could cover the additional miles with cash. CM was advised the meter would run and the entire bill is charged to voucher. This option doesn't supply oxygen for the pt.     CM contacted [facility] and left voicemail at 11:25 am. CM still awaiting return call.     CM staffed case with CM Coordinator and CM Manager and was advised to Teacher, English as a foreign language transport and bill to [facility] as per [employer]. It  is in the best interest of the pt to have oxygen administered on the trip to [place]. CM contacted Jeneen Rinks with [employer] and schedule transportation from [facility] ED lobby to [place], [place]. CM was advised they can't accommodate a ride until 5:00 pm. CM understood and accept transportation time.     CM advised ED Charge Nurse, floor nurse and pt of transportation mode and ETA.     CM met with pt to complete initial assessment. Pt is alert and oriented to person, place,time and situation. CM will continue to follow pt to assist with discharge plans as needed.     Care Management Interventions  PCP Verified by CM: No (pt reports not having a PCP in the [place])  Palliative Care Criteria Met (RRAT>21 & CHF Dx)?: No  Mode of Transport at Discharge: [facility] transportation for pt // charged to Case Management per Laverda Page )  Transition of Care Consult (CM Consult): Discharge Planning  Discharge Durable Medical Equipment: No (Pt has access to rolling walker )  Health Maintenance Reviewed: Yes  Physical Therapy Consult: No  Occupational Therapy Consult: No  Speech Therapy Consult: No  Current Support Network: Lives Alone  Confirm Follow Up Transport: Self  Plan discussed with Pt/Family/Caregiver: Yes  Discharge Location  Discharge Placement: [place], [place], [place]   [phone]

## 2016-08-14 NOTE — ED Notes (Signed)
Patient requesting pain med and nausea med. MD ntfd.

## 2016-08-14 NOTE — ED Notes (Signed)
Patient sleeping at this time. Call bell in reach.

## 2016-08-14 NOTE — ED Notes (Signed)
Pt continues to ambulate around in room.     Spot pulse ox 95% on RA while standing     Pt states that he can transport without his oxygen     ED Charge and Case Management aware.

## 2016-08-14 NOTE — ED Notes (Signed)
Patient medicated, and repositioned for comfort. Patient denies any further needs at this time. Call bell in reach. Patient remains on monitor x 3.

## 2016-08-14 NOTE — ED Notes (Signed)
Patient requesting transportation back to [facility]. Patient states his family is out of town, and no one is able to pick him up today. Charge nurse made aware.

## 2016-08-14 NOTE — ED Notes (Addendum)
Assumed care of patient from EMS. Per patient he [facility], abdominal pain. Patient has missed dialysis wed,friday. Patient completed dialysis Monday of this past week.Per EMS, patient had a "few runs of [facility] with them. On arrival, patient is having chest pain, SOB. Patient has IV established. [place] dialysis access in left chest. 1 previous access in each arm.  Per patient, he wears 3 lpm of 02 via [place] at home.

## 2016-08-14 NOTE — Consults (Signed)
Renal-I have spoken with ED Physician. Labs are stable. No emergent need for dialysis.  Asked for care management plan to be established on this non compliant patient. See previous hospital records. Shaylynn Nulty L Romir Klimowicz, MD

## 2016-08-14 NOTE — Consults (Signed)
Renal-I have spoken with ED Physician. Labs are stable. No emergent need for dialysis.  Asked for care management plan to be established on this non compliant patient. See previous hospital records. Gary Fleet, MD

## 2016-08-15 LAB — EKG, 12 LEAD, INITIAL
Atrial Rate: 90 {beats}/min
Calculated P Axis: 67 degrees
Calculated R Axis: -30 degrees
Calculated T Axis: 82 degrees
Diagnosis: NORMAL
P-R Interval: 190 ms
Q-T Interval: 400 ms
QRS Duration: 86 ms
QTC Calculation (Bezet): 489 ms
Ventricular Rate: 90 {beats}/min

## 2016-08-15 LAB — EKG 12-LEAD
Atrial Rate: 90 {beats}/min
Diagnosis: NORMAL
P Axis: 67 degrees
P-R Interval: 190 ms
Q-T Interval: 400 ms
QRS Duration: 86 ms
QTc Calculation (Bazett): 489 ms
R Axis: -30 degrees
T Axis: 82 degrees
Ventricular Rate: 90 {beats}/min

## 2016-08-18 ENCOUNTER — Emergency Department: Payer: Medicare Other

## 2016-08-18 ENCOUNTER — Emergency Department
Admission: EM | Admit: 2016-08-18 | Discharge: 2016-08-18 | Discharge status: Against Medical Advice | Payer: Medicare Other | Attending: General Practice | Admitting: General Practice

## 2016-08-18 DIAGNOSIS — N049 Nephrotic syndrome with unspecified morphologic changes: Secondary | ICD-10-CM | POA: Insufficient documentation

## 2016-08-18 DIAGNOSIS — R1010 Upper abdominal pain, unspecified: Secondary | ICD-10-CM

## 2016-08-18 DIAGNOSIS — R079 Chest pain, unspecified: Secondary | ICD-10-CM

## 2016-08-18 DIAGNOSIS — I509 Heart failure, unspecified: Secondary | ICD-10-CM | POA: Insufficient documentation

## 2016-08-18 DIAGNOSIS — R0602 Shortness of breath: Secondary | ICD-10-CM | POA: Insufficient documentation

## 2016-08-18 DIAGNOSIS — I110 Hypertensive heart disease with heart failure: Secondary | ICD-10-CM

## 2016-08-18 DIAGNOSIS — R072 Precordial pain: Secondary | ICD-10-CM | POA: Insufficient documentation

## 2016-08-18 LAB — CBC AND DIFFERENTIAL
Basophils %: 0.1 % (ref 0.0–3.0)
Basophils Absolute: 0 10*3/uL (ref 0.0–0.3)
Eosinophils %: 4.4 % (ref 0.0–7.0)
Eosinophils Absolute: 0.2 10*3/uL (ref 0.0–0.8)
Hematocrit: 25.5 % — ABNORMAL LOW (ref 39.0–52.5)
Hemoglobin: 8.6 gm/dL — ABNORMAL LOW (ref 13.0–17.5)
Lymphocytes Absolute: 0.4 10*3/uL — ABNORMAL LOW (ref 0.6–5.1)
Lymphocytes: 11.6 % — ABNORMAL LOW (ref 15.0–46.0)
MCH: 33 pg (ref 28–35)
MCHC: 34 gm/dL (ref 32–36)
MCV: 97 fL (ref 80–100)
MPV: 7.1 fL (ref 6.0–10.0)
Monocytes Absolute: 0.4 10*3/uL (ref 0.1–1.7)
Monocytes: 11.7 % (ref 3.0–15.0)
Neutrophils %: 72.1 % (ref 42.0–78.0)
Neutrophils Absolute: 2.6 10*3/uL (ref 1.7–8.6)
PLT CT: 104 10*3/uL — ABNORMAL LOW (ref 130–440)
RBC: 2.64 10*6/uL — ABNORMAL LOW (ref 4.00–5.70)
RDW: 16 % — ABNORMAL HIGH (ref 11.0–14.0)
WBC: 3.6 10*3/uL — ABNORMAL LOW (ref 4.0–11.0)

## 2016-08-18 LAB — PT AND APTT
PT INR: 1.1 (ref 0.9–1.2)
PT: 10.9 s (ref 9.4–11.5)
aPTT: 30.8 s (ref 22.0–35.0)

## 2016-08-18 LAB — COMPREHENSIVE METABOLIC PANEL
ALT: 18 U/L (ref 0–55)
AST (SGOT): 18 U/L (ref 10–42)
Albumin/Globulin Ratio: 0.96 Ratio (ref 0.70–1.50)
Albumin: 3.1 gm/dL — ABNORMAL LOW (ref 3.5–5.0)
Alkaline Phosphatase: 290 U/L — ABNORMAL HIGH (ref 40–145)
Anion Gap: 15.8 mMol/L (ref 7.0–18.0)
BUN / Creatinine Ratio: 5.7 Ratio — ABNORMAL LOW (ref 10.0–30.0)
BUN: 37 mg/dL — ABNORMAL HIGH (ref 7–22)
Bilirubin, Total: 0.6 mg/dL (ref 0.1–1.2)
CO2: 25 mMol/L (ref 20.0–30.0)
Calcium: 7.9 mg/dL — ABNORMAL LOW (ref 8.5–10.5)
Chloride: 103 mMol/L (ref 98–110)
Creatinine: 6.44 mg/dL — ABNORMAL HIGH (ref 0.80–1.30)
EGFR: 11 mL/min/{1.73_m2} — ABNORMAL LOW (ref 60–150)
Globulin: 3.2 gm/dL (ref 2.0–4.0)
Glucose: 85 mg/dL (ref 71–99)
Osmolality Calc: 285 mOsm/kg (ref 275–300)
Potassium: 4.8 mMol/L (ref 3.5–5.3)
Protein, Total: 6.3 gm/dL (ref 6.0–8.3)
Sodium: 139 mMol/L (ref 136–147)

## 2016-08-18 LAB — TROPONIN I: Troponin I: 0.08 ng/mL — ABNORMAL HIGH (ref 0.00–0.02)

## 2016-08-18 LAB — B-TYPE NATRIURETIC PEPTIDE: B-Natriuretic Peptide: 3846.2 pg/mL — ABNORMAL HIGH (ref 0.0–100.0)

## 2016-08-18 MED ORDER — ONDANSETRON HCL 4 MG/2ML IJ SOLN
4.0000 mg | Freq: Once | INTRAMUSCULAR | Status: AC
Start: 2016-08-18 — End: 2016-08-18
  Administered 2016-08-18: 05:00:00 4 mg via INTRAVENOUS

## 2016-08-18 MED ORDER — ONDANSETRON HCL 4 MG/2ML IJ SOLN
INTRAMUSCULAR | Status: AC
Start: 2016-08-18 — End: ?
  Filled 2016-08-18: qty 2

## 2016-08-18 NOTE — ED Notes (Signed)
Pt requested to leave. Risks explained to pt. Pt verbalized understanding. AMA form signed.

## 2016-08-18 NOTE — ED Triage Notes (Signed)
Chest pain with shortness of breath for the last 24 hours  Arrived with pain 10/10  Having diarrhea and had to skip dialysis yesterday  Has not had meds for three days

## 2016-08-18 NOTE — ED Provider Notes (Signed)
Physician/Midlevel provider first contact with patient: 08/18/16 0401         History     Chief Complaint   Patient presents with   . Chest Pain     HPI   46 year old male was brought to emergency room because of chest pain vomiting and diarrhea. Vomiting and diarrhea started yesterday morning but the chest pain is started couple of hours ago. Pain is dull and substernal and radiates to upper abdomen. No weakness or sweating. Patient has history of C. difficile. He also has history of myocardial infarction and end-stage renal failure he is on dialysis, hypertension, and asthma.      Past Medical History:   Diagnosis Date   . Asthma without status asthmaticus    . 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 Clostridium difficile diarrhea    . Drug-seeking behavior    . Hypertension    . Kidney failure    . Myocardial infarction     2002       Past Surgical History:   Procedure Laterality Date   . AV FISTULA PLACEMENT     . HERNIA REPAIR     . PERITONEAL CATHETER INSERTION         Family History   Problem Relation Age of Onset   . Hypertension Mother        Social  Social History   Substance Use Topics   . Smoking status: Current Every Day Smoker     Packs/day: 0.25     Years: 10.00     Types: Cigarettes     Last attempt to quit: 09/18/2015   . Smokeless tobacco: Former Neurosurgeon      Comment: declines   . Alcohol use No       .     Allergies   Allergen Reactions   . Lisinopril Anaphylaxis   . Toradol [Ketorolac Tromethamine] Anaphylaxis   . Morphine Other (See Comments)     "jittery" per patient, "I don't like the way it makes me feel". Previously charted anaphylaxis, but patient said just "jumpy, jittery"   . Percocet [Oxycodone-Acetaminophen] Other (See Comments)     "jittery" per patient, "I don't like the way it makes me feel"       Home Medications             albuterol (PROVENTIL HFA;VENTOLIN HFA) 108 (90 Base) MCG/ACT inhaler     Inhale 2 puffs into the lungs every 4 (four) hours as needed for Wheezing or Shortness of  Breath.     albuterol (PROVENTIL) (2.5 MG/3ML) 0.083% nebulizer solution     Take 2.5 mg by nebulization 2 (two) times daily as needed for Wheezing or Shortness of Breath.         albuterol-ipratropium (COMBIVENT RESPIMAT) 20-100 MCG/ACT Aero Soln     Inhale 2 puffs into the lungs every evening.         amLODIPine (NORVASC) 10 MG tablet     Take 10 mg by mouth daily.         carvedilol (COREG) 25 MG tablet     Take 25 mg by mouth 2 (two) times daily.     furosemide (LASIX) 80 MG tablet     Take 80 mg by mouth daily.     hydrALAZINE (APRESOLINE) 100 MG tablet     Take 100 mg by mouth 3 (three) times daily.     pantoprazole (PROTONIX) 40 MG tablet     Take 40  mg by mouth daily.     sevelamer (RENVELA) 800 MG tablet     Take 800 mg by mouth 3 (three) times daily with meals.           Flagged for Removal             acetaminophen (TYLENOL) 325 MG tablet     Take 2 tablets (650 mg total) by mouth every 4 (four) hours as needed for Pain.           Review of Systems   Constitutional: Positive for activity change.   Cardiovascular: Positive for chest pain.   Gastrointestinal: Positive for diarrhea and nausea.   All other systems reviewed and are negative.      Physical Exam    BP: (!) 187/112, Heart Rate: (!) 110, Temp: 98 F (36.7 C), Resp Rate: 19, SpO2: 99 %, Weight: 105.7 kg    Physical Exam   Constitutional: He is oriented to person, place, and time. He appears well-developed and well-nourished. No distress.   HENT:   Head: Normocephalic and atraumatic.   Right Ear: External ear normal.   Left Ear: External ear normal.   Nose: Nose normal.   Mouth/Throat: Oropharynx is clear and moist. No oropharyngeal exudate.   Neck: Normal range of motion. Neck supple. No JVD present. No tracheal deviation present. No thyromegaly present.   Cardiovascular: Normal rate, regular rhythm and normal heart sounds.    Pulmonary/Chest: Effort normal. No stridor. No respiratory distress. He has no wheezes. He has rales. He exhibits no  tenderness.   Diminished breath sound base of both lungs. Rales in both lung fields.   Abdominal: Soft. Bowel sounds are normal. He exhibits no distension and no mass. There is no tenderness. There is no guarding.   Musculoskeletal: Normal range of motion. He exhibits no edema, tenderness or deformity.   Lymphadenopathy:     He has no cervical adenopathy.   Neurological: He is alert and oriented to person, place, and time.   Skin: Skin is warm. No rash noted. He is not diaphoretic. No erythema. No pallor.   Nursing note and vitals reviewed.        MDM and ED Course     ED Medication Orders     Start Ordered     Status Ordering Provider    08/18/16 0429 08/18/16 0428  ondansetron (ZOFRAN) injection 4 mg  Once in ED     Route: Intravenous  Ordered Dose: 4 mg     Last MAR action:  Given Quorra Rosene, Jean Rosenthal             MDM  Reviewed lab results and chest x-ray with patient and patient was advised to be admitted to [facility] for dialysis. Patient insisted to have narcotics he was denied. He signed out AMA.               Procedures    Clinical Impression & Disposition     Clinical Impression  Final diagnoses:   Congestive heart failure, unspecified congestive heart failure chronicity, unspecified congestive heart failure type   Anasarca associated with disorder of kidney   Chest pain, unspecified type        ED Disposition     ED Disposition Condition Date/Time Comment    AMA  Wed Aug 18, 2016  6:34 AM Date: 08/18/2016  Patient: Evander Macaraeg  Admitted: 08/18/2016  4:01 AM  Attending Provider: Fredrik Rigger, MD    Kendell Bane  Dow Adolph or his authorized caregiver has made the decision for the patient to leave the emergency department against the advi ce of Betina Puckett. He or his authorized caregiver has been informed and understands the inherent risks, including death, respiratory failure.  He or his authorized caregiver has decided to accept the responsibility for this decision. Izetta Dakin  and all  necessary parties have been advised that he may return for further evaluation or treatment. His condition at time of discharge was stable.  Izetta Dakin had current vital signs as follows:  BP (!) 156/96   Pulse 99   Temp 98 F (36.7 C ) (Oral)   Resp (!) 27   Wt 105.7 kg              Discharge Medication List as of 08/18/2016  6:34 AM                    Riata Ikeda, Jean Rosenthal, MD  08/18/16 2015

## 2016-08-19 LAB — ECG 12-LEAD
P Wave Axis: 67 deg
P-R Interval: 180 ms
Patient Age: 45 years
Q-T Interval(Corrected): 461 ms
Q-T Interval: 347 ms
QRS Axis: 6 deg
QRS Duration: 100 ms
T Axis: 92 years
Ventricular Rate: 106 //min

## 2016-08-20 ENCOUNTER — Emergency Department: Admit: 2016-08-20 | Payer: MEDICARE

## 2016-08-20 ENCOUNTER — Inpatient Hospital Stay
Admit: 2016-08-20 | Discharge: 2016-08-20 | Discharge status: Against Medical Advice | Payer: MEDICARE | Attending: Internal Medicine | Admitting: Internal Medicine

## 2016-08-20 DIAGNOSIS — I120 Hypertensive chronic kidney disease with stage 5 chronic kidney disease or end stage renal disease: Secondary | ICD-10-CM

## 2016-08-20 LAB — LIPASE: Lipase: 318 U/L (ref 73–393)

## 2016-08-20 LAB — METABOLIC PANEL, COMPREHENSIVE
A-G Ratio: 0.9 — ABNORMAL LOW (ref 1.1–2.2)
ALT (SGPT): 20 U/L (ref 12–78)
AST (SGOT): 19 U/L (ref 15–37)
Albumin: 3.1 g/dL — ABNORMAL LOW (ref 3.5–5.0)
Alk. phosphatase: 291 U/L — ABNORMAL HIGH (ref 45–117)
Anion gap: 12 mmol/L (ref 5–15)
BUN/Creatinine ratio: 6 — ABNORMAL LOW (ref 12–20)
BUN: 56 MG/DL — ABNORMAL HIGH (ref 6–20)
Bilirubin, total: 0.4 MG/DL (ref 0.2–1.0)
CO2: 24 mmol/L (ref 21–32)
Calcium: 7.5 MG/DL — ABNORMAL LOW (ref 8.5–10.1)
Chloride: 106 mmol/L (ref 97–108)
Creatinine: 9 MG/DL — ABNORMAL HIGH (ref 0.70–1.30)
GFR est AA: 8 mL/min/{1.73_m2} — ABNORMAL LOW (ref >60)
GFR est non-AA: 6 mL/min/{1.73_m2} — ABNORMAL LOW (ref >60)
Globulin: 3.6 g/dL (ref 2.0–4.0)
Glucose: 100 mg/dL (ref 65–100)
Potassium: 5.2 mmol/L — ABNORMAL HIGH (ref 3.5–5.1)
Protein, total: 6.7 g/dL (ref 6.4–8.2)
Sodium: 142 mmol/L (ref 136–145)

## 2016-08-20 LAB — EKG, 12 LEAD, INITIAL
Atrial Rate: 105 {beats}/min
Calculated P Axis: 65 degrees
Calculated R Axis: -15 degrees
Calculated T Axis: 86 degrees
P-R Interval: 172 ms
Q-T Interval: 368 ms
QRS Duration: 76 ms
QTC Calculation (Bezet): 486 ms
Ventricular Rate: 105 {beats}/min

## 2016-08-20 LAB — CBC WITH AUTOMATED DIFF
ABS. BASOPHILS: 0.1 10*3/uL (ref 0.0–0.1)
ABS. EOSINOPHILS: 0.4 10*3/uL (ref 0.0–0.4)
ABS. IMM. GRANS.: 0 10*3/uL (ref 0.00–0.04)
ABS. LYMPHOCYTES: 0.5 10*3/uL — ABNORMAL LOW (ref 0.8–3.5)
ABS. MONOCYTES: 0.5 10*3/uL (ref 0.0–1.0)
ABS. NEUTROPHILS: 3.5 10*3/uL (ref 1.8–8.0)
ABSOLUTE NRBC: 0 10*3/uL (ref 0.00–0.01)
BASOPHILS: 1 % (ref 0–1)
EOSINOPHILS: 8 % — ABNORMAL HIGH (ref 0–7)
HCT: 26.1 % — ABNORMAL LOW (ref 36.6–50.3)
HGB: 8.2 g/dL — ABNORMAL LOW (ref 12.1–17.0)
IMMATURE GRANULOCYTES: 0 % (ref 0.0–0.5)
LYMPHOCYTES: 10 % — ABNORMAL LOW (ref 12–49)
MCH: 32 PG (ref 26.0–34.0)
MCHC: 31.4 g/dL (ref 30.0–36.5)
MCV: 102 FL — ABNORMAL HIGH (ref 80.0–99.0)
MONOCYTES: 9 % (ref 5–13)
MPV: 10 FL (ref 8.9–12.9)
NEUTROPHILS: 72 % (ref 32–75)
NRBC: 0 PER 100 WBC
PLATELET: 131 10*3/uL — ABNORMAL LOW (ref 150–400)
RBC: 2.56 M/uL — ABNORMAL LOW (ref 4.10–5.70)
RDW: 17.7 % — ABNORMAL HIGH (ref 11.5–14.5)
WBC: 5 10*3/uL (ref 4.1–11.1)

## 2016-08-20 LAB — TROPONIN I: Troponin-I, Qt.: 0.06 ng/mL — ABNORMAL HIGH (ref <0.05)

## 2016-08-20 LAB — EKG 12-LEAD
Atrial Rate: 105 {beats}/min
P Axis: 65 degrees
P-R Interval: 172 ms
Q-T Interval: 368 ms
QRS Duration: 76 ms
QTc Calculation (Bazett): 486 ms
R Axis: -15 degrees
T Axis: 86 degrees
Ventricular Rate: 105 {beats}/min

## 2016-08-20 MED ORDER — ACETAMINOPHEN 325 MG TABLET
325 mg | ORAL | Status: DC
Start: 2016-08-20 — End: 2016-08-20

## 2016-08-20 MED ORDER — CARVEDILOL 12.5 MG TAB
12.5 mg | Freq: Two times a day (BID) | ORAL | Status: DC
Start: 2016-08-20 — End: 2016-08-20

## 2016-08-20 MED ORDER — SEVELAMER CARBONATE 800 MG TAB
800 mg | Freq: Three times a day (TID) | ORAL | Status: DC
Start: 2016-08-20 — End: 2016-08-20

## 2016-08-20 MED ORDER — ONDANSETRON (PF) 4 MG/2 ML INJECTION
4 mg/2 mL | INTRAMUSCULAR | Status: AC
Start: 2016-08-20 — End: 2016-08-20
  Administered 2016-08-20: 09:00:00 via INTRAVENOUS

## 2016-08-20 MED ORDER — CARVEDILOL 12.5 MG TAB
12.5 mg | Freq: Two times a day (BID) | ORAL | Status: DC
Start: 2016-08-20 — End: 2016-08-20
  Administered 2016-08-20: 12:00:00 via ORAL

## 2016-08-20 MED ORDER — FUROSEMIDE 40 MG TAB
40 mg | Freq: Every day | ORAL | Status: DC
Start: 2016-08-20 — End: 2016-08-20

## 2016-08-20 MED ORDER — ONDANSETRON (PF) 4 MG/2 ML INJECTION
4 mg/2 mL | Freq: Once | INTRAMUSCULAR | Status: DC
Start: 2016-08-20 — End: 2016-08-20

## 2016-08-20 MED ORDER — HYDROCODONE-ACETAMINOPHEN 10 MG-325 MG TAB
10-325 mg | ORAL | Status: DC
Start: 2016-08-20 — End: 2016-08-20

## 2016-08-20 MED ORDER — HYDROMORPHONE 1 MG/ML ORAL LIQUID
1 mg/mL | Freq: Once | ORAL | Status: DC
Start: 2016-08-20 — End: 2016-08-20

## 2016-08-20 MED ORDER — ALBUTEROL SULFATE HFA 90 MCG/ACTUATION AEROSOL INHALER
90 mcg/actuation | Freq: Four times a day (QID) | RESPIRATORY_TRACT | Status: DC
Start: 2016-08-20 — End: 2016-08-20

## 2016-08-20 MED ORDER — HYDRALAZINE 20 MG/ML IJ SOLN
20 mg/mL | Freq: Once | INTRAMUSCULAR | Status: DC
Start: 2016-08-20 — End: 2016-08-20

## 2016-08-20 MED ORDER — AMLODIPINE 5 MG TAB
5 mg | Freq: Every day | ORAL | Status: DC
Start: 2016-08-20 — End: 2016-08-20

## 2016-08-20 MED ORDER — HYDRALAZINE 50 MG TAB
50 mg | Freq: Three times a day (TID) | ORAL | Status: DC
Start: 2016-08-20 — End: 2016-08-20

## 2016-08-20 MED FILL — CARVEDILOL 12.5 MG TAB: 12.5 mg | ORAL | Qty: 2

## 2016-08-20 MED FILL — SEVELAMER CARBONATE 800 MG TAB: 800 mg | ORAL | Qty: 1

## 2016-08-20 MED FILL — HYDROMORPHONE 1 MG/ML ORAL LIQUID: 1 mg/mL | ORAL | Qty: 2

## 2016-08-20 MED FILL — ONDANSETRON (PF) 4 MG/2 ML INJECTION: 4 mg/2 mL | INTRAMUSCULAR | Qty: 2

## 2016-08-20 MED FILL — FUROSEMIDE 40 MG TAB: 40 mg | ORAL | Qty: 2

## 2016-08-20 MED FILL — ACETAMINOPHEN 325 MG TABLET: 325 mg | ORAL | Qty: 3

## 2016-08-20 MED FILL — HYDRALAZINE 20 MG/ML IJ SOLN: 20 mg/mL | INTRAMUSCULAR | Qty: 1

## 2016-08-20 MED FILL — VENTOLIN HFA 90 MCG/ACTUATION AEROSOL INHALER: 90 mcg/actuation | RESPIRATORY_TRACT | Qty: 8

## 2016-08-20 NOTE — Progress Notes (Signed)
Admission Medication Reconciliation:    Information obtained from:  patient interview, chart review, rx query    Comments/Recommendations: Updated PTA meds/reviewed patient's allergies.    1)  Removed: duplicate Albuterol, Aspirin/Salic/Caff headache    2)  Added: Protonix    3)  Changed: Coreg dose increased from 12.5 mg to 25 mg PO BID.  Discussed with        provider and inpatient order corrected.     4)  Last dose for all medications was Monday per patient.        Significant PMH/Disease States:   Past Medical History:   Diagnosis Date   ??? Adverse effect of anesthesia     sleep apnea uses oxygen at night    ??? Ascites    ??? Asthma    ??? C. difficile colitis    ??? CKD (chronic kidney disease) stage V requiring chronic dialysis (HCC)    ??? HTN (hypertension)    ??? SBP (spontaneous bacterial peritonitis) (HCC)    ??? Vomiting 08/20/2016       Chief Complaint for this Admission:    Chief Complaint   Patient presents with   ??? Chest Pain   ??? Abdominal Pain   ??? Shortness of Breath       Allergies:  Lisinopril; Morphine; and Toradol [ketorolac]    Prior to Admission Medications:   Prior to Admission Medications   Prescriptions Last Dose Informant Patient Reported? Taking?   albuterol (PROVENTIL HFA, VENTOLIN HFA, PROAIR HFA) 90 mcg/actuation inhaler   Yes No   Sig: Take 2 Puffs by inhalation every four (4) hours as needed for Wheezing.   amLODIPine (NORVASC) 10 mg tablet 08/16/2016  Yes Yes   Sig: Take 10 mg by mouth daily.   carvedilol (COREG) 25 mg tablet 08/16/2016  Yes Yes   Sig: Take 25 mg by mouth two (2) times daily (with meals).   furosemide (LASIX) 80 mg tablet 08/16/2016 at Unknown time  Yes Yes   Sig: Take 80 mg by mouth daily.   hydrALAZINE (APRESOLINE) 100 mg tablet 08/16/2016  Yes Yes   Sig: Take 100 mg by mouth three (3) times daily.   pantoprazole (PROTONIX) 20 mg tablet 08/16/2016  Yes Yes   Sig: Take 20 mg by mouth daily.   sevelamer (RENAGEL) 400 mg tablet 08/16/2016  Yes Yes    Sig: Take 800 mg by mouth three (3) times daily (with meals).      Facility-Administered Medications: None     Thank you for allowing me to participate in the care of this patient. If there are any further questions, please contact the pharmacy at [phone] or the medication reconciliation pharmacist at [phone].    Linwood DibblesYuliya Habib, Pharm.D., BCPS

## 2016-08-20 NOTE — Discharge Summary (Signed)
[name]    DISCHARGE SUMMARY    Name:Dohrmann, Burrel D.  MR#: [id number]  DOB: 11/17/1970  ACCOUNT #: [id number]   ADMIT DATE: 08/20/2016  DISCHARGE DATE: 08/20/2016    HOSPITAL COURSE:  For detailed history, please look at the history and physical.  Patient was mainly admitted for nausea, vomiting and missed dialysis; however, within few hours of his admission, he signed out against medical advice.  The patient has signed against medical advice in the past as well.  He was told that he needs to get dialyzed, but he still signed out AMA.    DIAGNOSES ON ADMISSION:  Diarrhea, chest pain, vomiting, abdominal pain.    DIAGNOSES ON DISCHARGE:  Missed dialysis and uncontrolled hypertension.     DISPOSITION:  Signed out AMA      Janey Petron F. Christy Ehrsam, MD       SFK/PN  D: 08/29/2016 13:43     T: 08/29/2016 19:11  JOB #: [id number]

## 2016-08-20 NOTE — ED Notes (Signed)
Assumed care of patient.  In NAD.  Tachypneic, but unlabored.  Reports 10/10 left sided chest pain. Pt sitting upright on edge of bed, states he can't breathe when he lays down.  Pt last had dialysis LAST Friday (4/27).  Reports having a "partial dialysis" on Monday (4/30) but had to stop due to vomiting and diarrhea.  Pt was previously offered Tylenol for his pain, he declined.  I offered patient ordered Norco, he again declines stating "Is it a pill?  I can't keep a pill down."  Off going nurse states pt has NOT vomited or dry heaved during his visit tonight.  Hypertensive, but states this is normal for him, "also I haven't kept my pills down in days."  Awaiting eval by Hospitalist

## 2016-08-20 NOTE — ED Triage Notes (Signed)
Patient arrives to ED with c/o upper left chest pain, described as pressure which radiates down his left arm which started yesterday morning @ 0900, + nausea and vomiting, MI in 2010, patient also c/o LLQ abdo pain which starts in the LLQ and radiates toward the RLQ, patient states he had C-DIFF last month and thinks it may have returned. + diarrhea

## 2016-08-20 NOTE — Progress Notes (Signed)
Patient was refused in IDR rounds- patient is refusing all medications, stating that he will only accept IV Dilaudid. The patient told RN staff that if he did not receive IV Dilaudid he would leave AMA. The patient signed out AMA before official assessment could be completed. Per rounds, patient has had numerous hospitalizations in the previous two weeks with similar drug seeking behavior- has history of signing out AMA. Larey DresserKelsey Wooten, MSW

## 2016-08-20 NOTE — ED Notes (Signed)
Report given to Amanda, RN

## 2016-08-20 NOTE — ED Notes (Signed)
Attempted to call report.

## 2016-08-20 NOTE — ED Provider Notes (Signed)
HPI Comments: This patient presents with multiple complaints. 3 or 4 days ago, he started having vomiting and diarrhea. He also developed some generalized abdominal pain. Last night, several hours ago, he started getting some pain on the left side of his chest near his hemodialysis catheter. He has history of smoking, ESRD, and MI. He also had a recent admission for C. difficile infection at the end of March, a little over a month ago. Nothing affects the pain. No blood in the vomit or diarrhea. He visits multiple different hospitals typically when he needs to. In the past week, he has been here, [place]. Asked him to pick one hospital to go to. He says he is now moving closer to [facility]. I reviewed his old chart. No cough or fever. He was last dialyzed 4 days ago at [facility]. He started having vomiting and diarrhea during dialysis it was cut short. He admits to shortness of breath as well.    Patient is a 46 y.o. male presenting with chest pain, abdominal pain, and shortness of breath.   Chest Pain (Angina)    Associated symptoms include abdominal pain and shortness of breath.   Abdominal Pain    Associated symptoms include chest pain.   Shortness of Breath   Associated symptoms include chest pain and abdominal pain.        Past Medical History:   Diagnosis Date   ??? Adverse effect of anesthesia     sleep apnea uses oxygen at night    ??? Ascites    ??? Asthma    ??? C. difficile colitis    ??? CKD (chronic kidney disease) stage V requiring chronic dialysis ([place])    ??? HTN (hypertension)    ??? SBP (spontaneous bacterial peritonitis) [facility])        Past Surgical History:   Procedure Laterality Date   ??? COLONOSCOPY N/A 03/04/2016    COLONOSCOPY performed by Macie Burows, MD at [facility]   ??? HX HERNIA REPAIR  2008    right inguinal and umbilical   ??? SIGMOIDOSCOPY,DIAGNOSTIC  03/04/2016              Family History:   Problem Relation Age of Onset   ??? Hypertension Mother         Social History     Social History   ??? Marital status: UNKNOWN     Spouse name: N/A   ??? Number of children: N/A   ??? Years of education: N/A     Occupational History   ??? Not on file.     Social History Main Topics   ??? Smoking status: Current Every Day Smoker     Packs/day: 0.50     Years: 10.00   ??? Smokeless tobacco: Never Used   ??? Alcohol use No   ??? Drug use: No   ??? Sexual activity: Not on file     Other Topics Concern   ??? Not on file     Social History Narrative         ALLERGIES: Lisinopril; Morphine; and Toradol [ketorolac]    Review of Systems   Respiratory: Positive for shortness of breath.    Cardiovascular: Positive for chest pain.   Gastrointestinal: Positive for abdominal pain.   All other systems reviewed and are negative.      Vitals:    08/20/16 0346   BP: (!) 180/99   Pulse: (!) 105   Resp: 16  Temp: 98.8 ??F (37.1 ??C)   SpO2: 98%   Weight: 107.4 kg (236 lb 12.8 oz)   Height: '6\' 1"'  (1.854 m)            Physical Exam   Constitutional:   Sleeping.  Awakens easily.  Smells like cigarettes.   HENT:   Head: Normocephalic.   Nose: Nose normal.   Mouth/Throat: Oropharynx is clear and moist.   Eyes: Conjunctivae are normal. Pupils are equal, round, and reactive to light.   Neck: No tracheal deviation present.   Cardiovascular: Regular rhythm.  Tachycardia present.    Pulmonary/Chest: Breath sounds normal. He is in respiratory distress.   Abdominal: Soft. He exhibits no distension. There is no rebound and no guarding.   Mild diffuse tenderness.  Obese.   Musculoskeletal: He exhibits no edema.   Neurological: He is alert.   Skin: Skin is warm and dry.   Psychiatric: He has a normal mood and affect.        MDM      ED Course       Procedures    ED EKG interpretation:  Rhythm: sinus tachycardia; and regular . Rate (approx.): 105; Axis: normal; P wave: normal; QRS interval: normal ; ST/T wave: normal; This EKG was interpreted by Horris Latino, DO,ED Provider.         Contact isolation.   K is 5.2.  Will admit to have HD later in the morning.    D/W Dr Gwendlyn Deutscher    Recent Results (from the past 12 hour(s))   EKG, 12 LEAD, INITIAL    Collection Time: 08/20/16  3:55 AM   Result Value Ref Range    Ventricular Rate 105 BPM    Atrial Rate 105 BPM    P-R Interval 172 ms    QRS Duration 76 ms    Q-T Interval 368 ms    QTC Calculation (Bezet) 486 ms    Calculated P Axis 65 degrees    Calculated R Axis -15 degrees    Calculated T Axis 86 degrees    Diagnosis       Sinus tachycardia  When compared with ECG of 14-Aug-2016 08:23,  No significant change was found     TROPONIN I    Collection Time: 08/20/16  4:02 AM   Result Value Ref Range    Troponin-I, Qt. 0.06 (H) <0.05 ng/mL   LIPASE    Collection Time: 08/20/16  4:02 AM   Result Value Ref Range    Lipase 318 73 - 393 U/L   CBC WITH AUTOMATED DIFF    Collection Time: 08/20/16  4:02 AM   Result Value Ref Range    WBC 5.0 4.1 - 11.1 K/uL    RBC 2.56 (L) 4.10 - 5.70 M/uL    HGB 8.2 (L) 12.1 - 17.0 g/dL    HCT 26.1 (L) 36.6 - 50.3 %    MCV 102.0 (H) 80.0 - 99.0 FL    MCH 32.0 26.0 - 34.0 PG    MCHC 31.4 30.0 - 36.5 g/dL    RDW 17.7 (H) 11.5 - 14.5 %    PLATELET 131 (L) 150 - 400 K/uL    MPV 10.0 8.9 - 12.9 FL    NRBC 0.0 0 PER 100 WBC    ABSOLUTE NRBC 0.00 0.00 - 0.01 K/uL    NEUTROPHILS 72 32 - 75 %    LYMPHOCYTES 10 (L) 12 - 49 %    MONOCYTES 9 5 - 13 %  EOSINOPHILS 8 (H) 0 - 7 %    BASOPHILS 1 0 - 1 %    IMMATURE GRANULOCYTES 0 0.0 - 0.5 %    ABS. NEUTROPHILS 3.5 1.8 - 8.0 K/UL    ABS. LYMPHOCYTES 0.5 (L) 0.8 - 3.5 K/UL    ABS. MONOCYTES 0.5 0.0 - 1.0 K/UL    ABS. EOSINOPHILS 0.4 0.0 - 0.4 K/UL    ABS. BASOPHILS 0.1 0.0 - 0.1 K/UL    ABS. IMM. GRANS. 0.0 0.00 - 0.04 K/UL    DF SMEAR SCANNED      RBC COMMENTS ANISOCYTOSIS  1+        RBC COMMENTS MACROCYTOSIS  1+        RBC COMMENTS OVALOCYTES  PRESENT       METABOLIC PANEL, COMPREHENSIVE    Collection Time: 08/20/16  4:02 AM   Result Value Ref Range    Sodium 142 136 - 145 mmol/L     Potassium 5.2 (H) 3.5 - 5.1 mmol/L    Chloride 106 97 - 108 mmol/L    CO2 24 21 - 32 mmol/L    Anion gap 12 5 - 15 mmol/L    Glucose 100 65 - 100 mg/dL    BUN 56 (H) 6 - 20 MG/DL    Creatinine 9.00 (H) 0.70 - 1.30 MG/DL    BUN/Creatinine ratio 6 (L) 12 - 20      GFR est AA 8 (L) >60 ml/min/1.47m    GFR est non-AA 6 (L) >60 ml/min/1.725m   Calcium 7.5 (L) 8.5 - 10.1 MG/DL    Bilirubin, total 0.4 0.2 - 1.0 MG/DL    ALT (SGPT) 20 12 - 78 U/L    AST (SGOT) 19 15 - 37 U/L    Alk. phosphatase 291 (H) 45 - 117 U/L    Protein, total 6.7 6.4 - 8.2 g/dL    Albumin 3.1 (L) 3.5 - 5.0 g/dL    Globulin 3.6 2.0 - 4.0 g/dL    A-G Ratio 0.9 (L) 1.1 - 2.2

## 2016-08-20 NOTE — Other (Signed)
TRANSFER - OUT REPORT:    Verbal report given to [facility], RN(name) on David [name]  being transferred to 653(unit) for routine progression of care       Report consisted of patient???s Situation, Background, Assessment and   Recommendations(SBAR).     Information from the following report(s) SBAR, ED Summary, [facility] and Recent Results was reviewed with the receiving nurse.    Lines:   Peripheral IV 08/20/16 Right Arm (Active)   Site Assessment Clean, dry, & intact 08/20/2016  4:06 AM   Phlebitis Assessment 0 08/20/2016  4:06 AM   Infiltration Assessment 0 08/20/2016  4:06 AM   Dressing Status Clean, dry, & intact 08/20/2016  4:06 AM   Hub Color/Line Status Pink 08/20/2016  4:06 AM        Opportunity for questions and clarification was provided.      Patient transported with:   O2 @ 2 liters     Megan M SwazilandJordan, RN

## 2016-08-20 NOTE — H&P (Signed)
[facility]  HISTORY AND PHYSICAL      Name:Siefken, Idelia SalmROY D.  MR#: [id number]  DOB: 1970/11/19  ACCOUNT #: [id number]   ADMIT DATE: 08/20/2016    PRIMARY CARE PHYSICIAN:  None.    PRESENTING COMPLAINT:  Diarrhea, chest pain, vomiting, abdominal pain.    HISTORY OF PRESENT ILLNESS:  The patient is a 46 year old African-American gentleman who has previous history significant for end-stage renal disease.  The patient says he has been on dialysis for the last 8 years.  [place] in [place] area, but recently moved to [place] to live with his sister.  The patient also has a previous history of C. difficile. The patient tells me that he had at least 7 or 8 episodes of C. difficile in the last year or so.  He also has history of coronary artery disease, status post MI and 2 stents were placed in 2010.  He presents to the emergency room due to vomiting, stomach pain, and chest pain.  On further questioning, the patient says that when he was discharged from the last hospital admission, his stool was getting better but for the last 7 or 8 days, it has been more loose.  He had finished his antibiotics a couple of weeks ago for C. difficile.  His second complaint is chest pain, which he describes as pressure on the left side and radiates to the left arm.  The patient also complains of shortness of breath.  On further questioning, the patient says that every week, they have been taking 6-7 liters of fluid out of his belly (paracentesis was done a week ago and according to him, they removed 6 liters).  The patient says he is unable to keep any of his medications down due to nausea and vomiting.  In the emergency room, the patient was offered Percocet, which he refused.  He wanted to have IV pain medication.  Patient is noncompliant.  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  He also has history of drug-seeking behavior with recurrent ED visits at signing out AMA.    PAST MEDICAL HISTORY:   1.  Significant for end-stage renal disease, on dialysis Monday, Wednesday and Friday.  Last dialysis was on Monday.  2.  Clostridium difficile diarrhea.  3.  History of access failure.  4.  History of NSTEMI with a stent placed in.  5.  Hypertension.  6.  Asthma.  7.  History of ascites.    PAST SURGICAL HISTORY:  Significant for colonoscopy in November.    SOCIAL HISTORY:  The patient does smoke 2-3 cigarettes a day.  He is divorced.  He does not drink, does not use any drugs.    ALLERGIES:  INCLUDE LISINOPRIL, MORPHINE, AND TORADOL.    MEDICATIONS:  His current medications are not clear at this time; however, the patient thinks he is on albuterol 2 puffs every 4 hours as needed, amlodipine 10 mg daily, Coreg 12.5 b.i.d., Lasix 80 mg daily, hydralazine 100 mg 3 times daily, Renagel 800 mg 3 times daily.    REVIEW OF SYSTEMS:  Negative except as mentioned in history of presenting illness.  All other systems were reviewed. No other positive finding was noted.    PHYSICAL EXAMINATION:  GENERAL:  The patient is a 46 year old African-American gentleman, not in any acute distress.  VITAL SIGNS:  Temperature 98.8, blood pressure on presentation was 180/99, pulse of 105, respiratory rate of 16, saturating 98% on room air.  HEENT:  Pupils equal and reactive to light and accommodation.  NECK:  Supple.  There is no adenopathy or JVD.  CHEST:  Clear.  No wheezing or crackles.  CARDIOVASCULAR:  S1 and S2 regular with S3.  ABDOMEN:  Reveals no tenderness, no guarding, no rigidity.  The patient does have ascites.  EXTREMITIES:  No pedal edema.    CENTRAL NERVOUS SYSTEM:  The patient is Alert, oriented, has normal strength, normal reflexes.  Plantars are downgoing.  Cranial nerves are normal.    LABORATORY DATA:  Done in the ER reveals a sodium 142, potassium 5.2, chloride 106, bicarbonate is 24, anion gap of 12, BUN is 56, creatinine is 9, calcium is 7.5, bilirubin is 0.4, protein 6.7, albumin is 3.1, globulin  is 3.6, ALT is 20, AST is 19, alkaline phosphatase is 291, lipase 318, troponin I is 1.06.  CBC reveals a white count of 5, hemoglobin of 8.2, hematocrit 26.1, MCV 102, platelet count is 131,000.  His EKG shows sinus tachycardia with a ventricular rate of 105 beats per minute.  Chest x-ray showed no acute finding.    ASSESSMENT AND PLAN:  The patient is a 46 year old gentleman who has multiple medical problems including end-stage renal disease, noncompliance, drug-seeking behavior, recurrent C. difficile, is being admitted due to:  1.  Nausea, vomiting, probably due to uremia as the patient has missed dialysis.  The patient will be admitted.  Will consult Dr. Mora Appl so that he can be dialyzed.  2.  Hypertension.  This is because the patient is unable to keep his medications down.  We will restart on his medications.  I will give him 1 dose of IV hydralazine at this time.  3.  History of recurrent Clostridium difficile.  Will check stool for C. difficile.  4.  Anemia of chronic disease due to end-stage renal disease.  Will check iron profile.  Will need ESA also.  5.  Chest pain with minimally elevated troponin.  EKG does not show any acute finding.  Patient's last echocardiogram, which was done in 2017 showed an ejection fraction of 55% to 60%.  Will check serial cardiac enzymes.  6.  Drug seeking behavior and noncompliance.  7.  History of asthma, which seems to be reasonably controlled.  The patient is advised to stop smoking.  8.  Ascites with previous history significant for SBP.  Patient said that he has been getting a paracentesis on almost a weekly basis.  He tells me he had a liver biopsy which was unremarkable.  I will wait until he is dialyzed and then will decide if he needs paracentesis.  9.  DVT prophylaxis.      Patient signed out AMA within few hours of admission.      Jaci Lazier, MD       SFK/LN  D: 08/20/2016 07:42     T: 08/20/2016 08:12  JOB #: [id number]

## 2016-08-20 NOTE — Other (Signed)
Short Stay Review       Pt Name:  David [name]   MR#  [id number]   CSN#   [id number]   [facility]   Hospitalization date  08/20/2016  3:42 AM  08/20/2016 10:21 AM   Current Attending Physician  No att. providers found     A discharge order has been placed for this episode of hospital care for Mr. David [name]; since this hospital stay is less than two midnights, I reviewed Mr. David [name]'s chart.    Mr. David [name]'s healthcare insurance/benefit include:  Payor: [place] MED[place]ARE / Plan: VA MEDICARE PART A & B / Product Type: Medicare /     Utilization Review related case summary:   Age  46 y.o.   BMI  Body mass index is 31.24 kg/(m^2).    PMHx includes  ESRd/HD, C Diff colitis, HTN and others    Hospital course  The pt was hospitalized for N/V/D with suspicion of C Diff infection .  It is noted that the pt had bee poorly compliant with medication, supportive care and Rx.      Risk of deterioration  High             On the basis of chart review, this patient's hospitalization status      is appropriate for INPATIENT           Gabriel CarinaHasan M Soua Lenk MD MPH FACP   Physician Advisor    [facility]   [facility]  [place] [facility]     [facility]  [facility]   Utilization Review, Care Management         CSN:  [id number]   HAR:   [id number]  Admitted on :  08/20/2016   Discharge order  08/20/2016

## 2016-08-20 NOTE — ED Notes (Signed)
Pt refused oral dilaudid and all morning medications. Pt states that he wants to wait until he goes upstairs and only wants an IV medication for his pain.

## 2016-08-20 NOTE — Progress Notes (Signed)
Assumed care of pt this am from ED. Pt is drowsy but oriented. Nurse noted pt had an inhaler in his had, nurse asked pt if nurse could put it with his other belongings. Pt refused, stated he wanted the inhaler with him. Nurse instructed pt that no home medications could be taken while in the hospital without MD approval. Pt refused to put the inhaler away. While observing pt nurse noticed pt had a folding knife. Nurse asked pt if the knife could be placed in lock up while in the hospital, pt agrees, knife removed from pt. Nurse asked pt if he could do a skin check on pt to ensure pt had no open or problem areas, pt refused. Nurse asked pt to please call before getting out of bed, pt refused. Pt states that he is not going to fall. Nurse notifies pt that it is for patient safety at least until pt is steady on his feet. Pt reassured that we are just trying to keep him safe while he is here in the hospital. Pt again refuses. Pt states he wants to leave AMA, CCL stephanie called to bedside. After CCL attempt to calm pt, pt asked if he would be getting IV pain medication. Pt notified that only PO medication available at this time. MD at bedside. Pt initially agreeable to stay, but once no IV pain medication was going to be given, pt decides to leave AMA. AMA paperwork signed, MD notified. Pt wheeled down to ER entrance for discharge, pt last seen headed to restroom.

## 2016-08-20 NOTE — Progress Notes (Signed)
Assisted bedside RN with agitated patient. Patient refusing to cooperate and would not let bedside RN place bed alarm under patient for his safety. Patient stated "you are treating me like an invalid". I assured patient that while he was here we wanted to do everything to ensure his safety.He stated "well then give me the papers I will leave AMA". I asked what else we could do to help him stay in the hospital and be discharged safely. He stated"what pain meds am I on." I asked "Why will that determine if you stay or not?" Patient states "Yes I want IV dilaudid." Dr. Welton FlakesKhan paged and informed of situation.

## 2016-08-20 NOTE — ED Notes (Signed)
Dr. Khan at bedside

## 2016-08-20 NOTE — Discharge Summary (Signed)
[facility]    DISCHARGE SUMMARY    Name:[name], David SalmROY D.  MR#: [id number]  DOB: [birth year]  ACCOUNT #: [id number]   ADMIT DATE: 08/20/2016  DISCHARGE DATE: 08/20/2016    HOSPITAL COURSE:  For detailed history, please look at the history and physical.  Patient was mainly admitted for nausea, vomiting and missed dialysis; however, within few hours of his admission, he signed out against medical advice.  The patient has signed against medical advice in the past as well.  He was told that he needs to get dialyzed, but he still signed out AMA.    DIAGNOSES ON ADMISSION:  Diarrhea, chest pain, vomiting, abdominal pain.    DIAGNOSES ON DISCHARGE:  Missed dialysis and uncontrolled hypertension.     DISPOSITION:  Signed out AMA      David LazierSALMAN F. Dianca Owensby, MD       SFK/PN  D: 08/29/2016 13:43     T: 08/29/2016 19:11  JOB #: [id number]

## 2016-08-26 ENCOUNTER — Inpatient Hospital Stay (HOSPITAL_BASED_OUTPATIENT_CLINIC_OR_DEPARTMENT_OTHER)
Admission: AD | Admit: 2016-08-26 | Discharge: 2016-08-30 | DRG: 291 | Disposition: A | Discharge status: Home / Self Care | Payer: Medicare Other | Source: Other Acute Inpatient Hospital | Attending: Internal Medicine | Admitting: Internal Medicine

## 2016-08-26 ENCOUNTER — Encounter (HOSPITAL_BASED_OUTPATIENT_CLINIC_OR_DEPARTMENT_OTHER): Payer: Self-pay

## 2016-08-26 ENCOUNTER — Emergency Department (EMERGENCY_DEPARTMENT_HOSPITAL): Payer: Medicare Other | Admitting: UHP RADIOLOGY

## 2016-08-26 ENCOUNTER — Emergency Department
Admission: EM | Admit: 2016-08-26 | Discharge: 2016-08-26 | Disposition: A | Discharge status: Other Acute Inpatient Hospital | Payer: Medicare Other | Attending: Emergency Medicine | Admitting: Emergency Medicine

## 2016-08-26 DIAGNOSIS — E8770 Fluid overload, unspecified: Secondary | ICD-10-CM | POA: Insufficient documentation

## 2016-08-26 DIAGNOSIS — I10 Essential (primary) hypertension: Secondary | ICD-10-CM

## 2016-08-26 DIAGNOSIS — N185 Chronic kidney disease, stage 5: Secondary | ICD-10-CM | POA: Diagnosis present

## 2016-08-26 DIAGNOSIS — I509 Heart failure, unspecified: Secondary | ICD-10-CM | POA: Diagnosis present

## 2016-08-26 DIAGNOSIS — A0472 Enterocolitis due to Clostridium difficile, not specified as recurrent: Secondary | ICD-10-CM | POA: Insufficient documentation

## 2016-08-26 DIAGNOSIS — J810 Acute pulmonary edema: Secondary | ICD-10-CM | POA: Diagnosis present

## 2016-08-26 DIAGNOSIS — Z8619 Personal history of other infectious and parasitic diseases: Secondary | ICD-10-CM

## 2016-08-26 DIAGNOSIS — I120 Hypertensive chronic kidney disease with stage 5 chronic kidney disease or end stage renal disease: Secondary | ICD-10-CM | POA: Insufficient documentation

## 2016-08-26 DIAGNOSIS — J84113 Idiopathic non-specific interstitial pneumonitis: Secondary | ICD-10-CM

## 2016-08-26 DIAGNOSIS — Z9115 Patient's noncompliance with renal dialysis: Secondary | ICD-10-CM

## 2016-08-26 DIAGNOSIS — Z992 Dependence on renal dialysis: Secondary | ICD-10-CM | POA: Insufficient documentation

## 2016-08-26 DIAGNOSIS — Z955 Presence of coronary angioplasty implant and graft: Secondary | ICD-10-CM | POA: Insufficient documentation

## 2016-08-26 DIAGNOSIS — D631 Anemia in chronic kidney disease: Secondary | ICD-10-CM | POA: Diagnosis present

## 2016-08-26 DIAGNOSIS — J45909 Unspecified asthma, uncomplicated: Secondary | ICD-10-CM | POA: Insufficient documentation

## 2016-08-26 DIAGNOSIS — A498 Other bacterial infections of unspecified site: Secondary | ICD-10-CM

## 2016-08-26 DIAGNOSIS — I132 Hypertensive heart and chronic kidney disease with heart failure and with stage 5 chronic kidney disease, or end stage renal disease: Principal | ICD-10-CM | POA: Diagnosis present

## 2016-08-26 DIAGNOSIS — I252 Old myocardial infarction: Secondary | ICD-10-CM

## 2016-08-26 DIAGNOSIS — J449 Chronic obstructive pulmonary disease, unspecified: Secondary | ICD-10-CM | POA: Diagnosis present

## 2016-08-26 DIAGNOSIS — F17210 Nicotine dependence, cigarettes, uncomplicated: Secondary | ICD-10-CM | POA: Insufficient documentation

## 2016-08-26 DIAGNOSIS — N186 End stage renal disease: Secondary | ICD-10-CM | POA: Diagnosis present

## 2016-08-26 DIAGNOSIS — Z885 Allergy status to narcotic agent status: Secondary | ICD-10-CM

## 2016-08-26 DIAGNOSIS — Z9889 Other specified postprocedural states: Secondary | ICD-10-CM

## 2016-08-26 DIAGNOSIS — R079 Chest pain, unspecified: Secondary | ICD-10-CM

## 2016-08-26 DIAGNOSIS — R197 Diarrhea, unspecified: Secondary | ICD-10-CM | POA: Insufficient documentation

## 2016-08-26 DIAGNOSIS — R188 Other ascites: Secondary | ICD-10-CM | POA: Diagnosis present

## 2016-08-26 DIAGNOSIS — R0602 Shortness of breath: Secondary | ICD-10-CM

## 2016-08-26 DIAGNOSIS — D649 Anemia, unspecified: Secondary | ICD-10-CM | POA: Insufficient documentation

## 2016-08-26 DIAGNOSIS — A0471 Enterocolitis due to Clostridium difficile, recurrent: Secondary | ICD-10-CM | POA: Diagnosis present

## 2016-08-26 DIAGNOSIS — N2581 Secondary hyperparathyroidism of renal origin: Secondary | ICD-10-CM | POA: Diagnosis present

## 2016-08-26 DIAGNOSIS — Z888 Allergy status to other drugs, medicaments and biological substances status: Secondary | ICD-10-CM

## 2016-08-26 DIAGNOSIS — Z79899 Other long term (current) drug therapy: Secondary | ICD-10-CM

## 2016-08-26 HISTORY — DX: Unspecified kidney failure: N19

## 2016-08-26 HISTORY — DX: Essential (primary) hypertension: I10

## 2016-08-26 HISTORY — DX: Unspecified asthma, uncomplicated: J45.909

## 2016-08-26 HISTORY — DX: Other ascites: R18.8

## 2016-08-26 HISTORY — DX: Old myocardial infarction: I25.2

## 2016-08-26 HISTORY — DX: Dependence on renal dialysis (CMS HCC): Z99.2

## 2016-08-26 LAB — COMPREHENSIVE METABOLIC PROFILE - BMC/JMC ONLY
ALBUMIN/GLOBULIN RATIO: 1.1 (ref 0.8–2.0)
ALBUMIN: 3.3 g/dL — ABNORMAL LOW (ref 3.5–5.0)
ALKALINE PHOSPHATASE: 231 U/L — ABNORMAL HIGH (ref 38–126)
ALT (SGPT): 23 U/L (ref 17–63)
ANION GAP: 10 mmol/L (ref 3–11)
ANION GAP: 10 mmol/L (ref 3–11)
AST (SGOT): 39 U/L (ref 15–41)
BILIRUBIN TOTAL: 0.5 mg/dL (ref 0.3–1.2)
BUN/CREA RATIO: 6 (ref 6–22)
BUN: 46 mg/dL — ABNORMAL HIGH (ref 6–20)
CALCIUM: 7.1 mg/dL — ABNORMAL LOW (ref 8.6–10.3)
CHLORIDE: 106 mmol/L (ref 101–111)
CO2 TOTAL: 24 mmol/L (ref 22–32)
CREATININE: 7.72 mg/dL — ABNORMAL HIGH (ref 0.61–1.24)
ESTIMATED GFR: 9 mL/min/1.73mˆ2 — ABNORMAL LOW (ref >60)
GLUCOSE: 102 mg/dL (ref 70–110)
POTASSIUM: 5.3 mmol/L — ABNORMAL HIGH (ref 3.4–5.1)
PROTEIN TOTAL: 6.2 g/dL — ABNORMAL LOW (ref 6.4–8.3)
SODIUM: 140 mmol/L (ref 136–145)

## 2016-08-26 LAB — CBC WITH DIFF
BASOPHIL #: 0 x10ˆ3/uL (ref 0.00–0.10)
BASOPHIL %: 1 % (ref 0–3)
EOSINOPHIL #: 0 x10ˆ3/uL (ref 0.00–0.50)
EOSINOPHIL %: 0 % (ref 0–5)
HCT: 23.6 % — ABNORMAL LOW (ref 40.0–50.0)
HGB: 7.9 g/dL — ABNORMAL LOW (ref 13.5–18.0)
LYMPHOCYTE #: 0.4 x10ˆ3/uL — ABNORMAL LOW (ref 1.00–4.80)
LYMPHOCYTE %: 7 % — ABNORMAL LOW (ref 15–43)
MCH: 31.8 pg (ref 27.5–33.2)
MCHC: 33.7 g/dL (ref 32.0–36.0)
MCV: 94.5 fL (ref 82.0–97.0)
MONOCYTE #: 0.5 x10ˆ3/uL (ref 0.20–0.90)
MONOCYTE %: 8 % (ref 5–12)
MPV: 7.6 fL (ref 7.4–10.5)
NEUTROPHIL #: 4.9 x10ˆ3/uL (ref 1.50–6.50)
NEUTROPHIL %: 84 % — ABNORMAL HIGH (ref 43–76)
PLATELETS: 118 x10ˆ3/uL — ABNORMAL LOW (ref 150–450)
RBC: 2.49 x10ˆ6/uL — ABNORMAL LOW (ref 4.40–5.80)
RDW: 18.6 % — ABNORMAL HIGH (ref 11.0–16.0)
WBC: 5.8 x10?3/uL (ref 4.0–11.0)

## 2016-08-26 LAB — TROPONIN-I: TROPONIN I: 0.06 ng/mL (ref 0.00–0.06)

## 2016-08-26 LAB — B-TYPE NATRIURETIC PEPTIDE (BNP),PLASMA: BNP: 4750 pg/mL — ABNORMAL HIGH (ref 0–100)

## 2016-08-26 LAB — PT/INR
INR: 1.1
PROTHROMBIN TIME: 12.2 s (ref 9.2–12.3)

## 2016-08-26 LAB — PTT (PARTIAL THROMBOPLASTIN TIME): APTT: 32.4 s (ref 25.0–36.8)

## 2016-08-26 MED ORDER — HEPARIN (PORCINE) 5,000 UNIT/ML INJECTION SOLUTION
5000.0000 [IU] | Freq: Three times a day (TID) | INTRAMUSCULAR | Status: DC
Start: 2016-08-27 — End: 2016-08-28
  Administered 2016-08-27 – 2016-08-28 (×5): 0 [IU] via SUBCUTANEOUS
  Filled 2016-08-26: qty 1

## 2016-08-26 MED ORDER — ONDANSETRON HCL (PF) 4 MG/2 ML INJECTION SOLUTION
4.0000 mg | Freq: Once | INTRAMUSCULAR | Status: AC
Start: 2016-08-26 — End: 2016-08-26
  Administered 2016-08-26 (×2): 4 mg via INTRAVENOUS
  Filled 2016-08-26: qty 2

## 2016-08-26 MED ORDER — NICOTINE 21 MG/24 HR DAILY TRANSDERMAL PATCH
21.0000 mg | MEDICATED_PATCH | Freq: Every day | TRANSDERMAL | Status: DC
Start: 2016-08-27 — End: 2016-08-30
  Administered 2016-08-27: 0 mg via TRANSDERMAL
  Administered 2016-08-28 – 2016-08-29 (×2): 21 mg via TRANSDERMAL
  Administered 2016-08-29 – 2016-08-30 (×2): 0 mg via TRANSDERMAL
  Filled 2016-08-26 (×3): qty 1

## 2016-08-26 MED ORDER — HYDROMORPHONE 0.5 MG/0.5 ML INJECTION SYRINGE
0.50 mg | INJECTION | INTRAMUSCULAR | Status: AC
Start: 2016-08-26 — End: 2016-08-26
  Administered 2016-08-26: 0.5 mg via INTRAVENOUS
  Filled 2016-08-26: qty 1

## 2016-08-26 MED ORDER — HYDRALAZINE 20 MG/ML INJECTION SOLUTION
10.00 mg | INTRAMUSCULAR | Status: AC
Start: 2016-08-26 — End: 2016-08-26
  Administered 2016-08-26: 10 mg via INTRAVENOUS
  Filled 2016-08-26: qty 1

## 2016-08-26 MED ORDER — FUROSEMIDE 10 MG/ML INJECTION SOLUTION
40.00 mg | INTRAMUSCULAR | Status: AC
Start: 2016-08-26 — End: 2016-08-26
  Administered 2016-08-26: 40 mg via INTRAVENOUS
  Filled 2016-08-26: qty 4

## 2016-08-26 MED ORDER — ALBUTEROL SULFATE HFA 90 MCG/ACTUATION AEROSOL INHALER
2.00 | INHALATION_SPRAY | RESPIRATORY_TRACT | Status: DC | PRN
Start: 2016-08-26 — End: 2016-08-30
  Administered 2016-08-29: 2 via RESPIRATORY_TRACT
  Filled 2016-08-26 (×3): qty 8

## 2016-08-26 MED ORDER — NITROGLYCERIN 0.4 MG SUBLINGUAL TABLET
0.4000 mg | SUBLINGUAL_TABLET | SUBLINGUAL | Status: DC | PRN
Start: 2016-08-26 — End: 2016-08-30

## 2016-08-26 MED ORDER — SEVELAMER HCL 800 MG TABLET
800.00 mg | ORAL_TABLET | Freq: Three times a day (TID) | ORAL | Status: DC
Start: 2016-08-27 — End: 2016-08-27
  Administered 2016-08-27: 0 mg via ORAL
  Filled 2016-08-26 (×11): qty 1

## 2016-08-26 MED ORDER — CARVEDILOL 12.5 MG TABLET
25.00 mg | ORAL_TABLET | Freq: Two times a day (BID) | ORAL | Status: DC
Start: 2016-08-27 — End: 2016-08-30
  Administered 2016-08-26: 25 mg via ORAL
  Administered 2016-08-27 (×2): 0 mg via ORAL
  Administered 2016-08-28 – 2016-08-30 (×5): 25 mg via ORAL
  Filled 2016-08-26 (×5): qty 2

## 2016-08-26 MED ORDER — HYDRALAZINE 25 MG TABLET
100.0000 mg | ORAL_TABLET | Freq: Once | ORAL | Status: DC
Start: 2016-08-26 — End: 2016-08-26
  Filled 2016-08-26: qty 4

## 2016-08-26 MED ORDER — METRONIDAZOLE 500 MG TABLET
500.00 mg | ORAL_TABLET | ORAL | Status: AC
Start: 2016-08-26 — End: 2016-08-26
  Administered 2016-08-26 (×2): 500 mg via ORAL
  Filled 2016-08-26: qty 1

## 2016-08-26 MED ORDER — HYDRALAZINE 50 MG TABLET
100.00 mg | ORAL_TABLET | Freq: Three times a day (TID) | ORAL | Status: DC
Start: 2016-08-27 — End: 2016-08-30
  Administered 2016-08-26: 100 mg via ORAL
  Administered 2016-08-27 (×2): 0 mg via ORAL
  Administered 2016-08-27 – 2016-08-28 (×4): 100 mg via ORAL
  Administered 2016-08-29: 0 mg via ORAL
  Administered 2016-08-29 (×2): 100 mg via ORAL
  Filled 2016-08-26 (×9): qty 2

## 2016-08-26 MED ORDER — FUROSEMIDE 10 MG/ML INJECTION SOLUTION
80.00 mg | INTRAMUSCULAR | Status: AC
Start: 2016-08-27 — End: 2016-08-26
  Administered 2016-08-26: 80 mg via INTRAVENOUS
  Filled 2016-08-26: qty 8

## 2016-08-26 MED ORDER — FENTANYL (PF) 50 MCG/ML INJECTION SOLUTION
50.0000 ug | INTRAMUSCULAR | Status: DC | PRN
Start: 2016-08-26 — End: 2016-08-30
  Administered 2016-08-26 – 2016-08-28 (×16): 50 ug via INTRAVENOUS
  Administered 2016-08-29: 0 ug via INTRAVENOUS
  Administered 2016-08-29 – 2016-08-30 (×10): 50 ug via INTRAVENOUS
  Filled 2016-08-26 (×27): qty 2

## 2016-08-26 MED ORDER — AMLODIPINE 5 MG TABLET
10.0000 mg | ORAL_TABLET | Freq: Every day | ORAL | Status: DC
Start: 2016-08-27 — End: 2016-08-29
  Administered 2016-08-27: 0 mg via ORAL
  Administered 2016-08-28: 10 mg via ORAL
  Filled 2016-08-26 (×2): qty 2

## 2016-08-26 MED ORDER — PANTOPRAZOLE 40 MG TABLET,DELAYED RELEASE
40.0000 mg | DELAYED_RELEASE_TABLET | Freq: Every day | ORAL | Status: DC
Start: 2016-08-27 — End: 2016-08-27
  Administered 2016-08-27: 0 mg via ORAL

## 2016-08-26 MED ORDER — SODIUM CHLORIDE 0.9 % (FLUSH) INJECTION SYRINGE
10.0000 mL | INJECTION | INTRAMUSCULAR | Status: DC | PRN
Start: 2016-08-26 — End: 2016-08-26

## 2016-08-26 MED ORDER — SODIUM CHLORIDE 0.9 % INTRAVENOUS SOLUTION
5.00 ug/min | INTRAVENOUS | Status: DC
Start: 2016-08-26 — End: 2016-08-26

## 2016-08-26 MED ADMIN — lactated Ringers intravenous solution: ORAL | NDC 00338011704

## 2016-08-26 NOTE — Nurses Notes (Signed)
Dr. Bing Quarryebord paged to inform of pt arrival.

## 2016-08-26 NOTE — Nurses Notes (Signed)
Spoke with Dr. Bing Quarryebord, updated on pt condition. BPs 160-190/110-120, 80mg  Lasix to be administered in addition to 40mg  [facility], pt reports he is anuric, and left chest port for HD use only.

## 2016-08-26 NOTE — ED Nurses Note (Addendum)
Pt ambulated to room 10 with c/o CP and b/l lower quads with n/v/d x 5 days. Pt placed on cardiac monitor, and pulse ox on arrival. EKG completed on arrival by [place], RN. Pt placed on 2L NC O2. IV access obtained and documented. Pt advised of NPO status and stated understanding. Pt has a dialysis port in place. Dialysis was last infused 08/20/16 x 3.5 hours. Call bell in reach. Side rail up x1. Bed low position.

## 2016-08-26 NOTE — ED Nurses Note (Signed)
Dr Ramiro HarvestParikh notified that pt's b/p elevated at at 205/112 requested additional Hydralazine. Transport arrived. Report given Gerarda GuntherAllie, RN. Belongings with pt.

## 2016-08-26 NOTE — ED Nurses Note (Signed)
[facility] to room ICU 1. Pt stable with b/p elevated with 10/10 Chest pain. Dr Ramiro HarvestParikh notified. Additional Hydralazine 10 mg given prior to transport and Fentanyl ordered to be given in route. All paperwork sent with pt and his belongings.

## 2016-08-26 NOTE — ED Triage Notes (Signed)
Pt. Arrives to the room ambulatory  with complaint of mid to left chest pressure around 1100 today ; pt. Reports shortness of breath as well; pt. Reports he normally does dialysis MWF ; pt. Last treatment was Friday ; pt. Reports abdominal pain, diarrhea and vomiting since Sunday; pt. Reports CDiff last month ; pt. Has ascites present; bil. Lower ext. +4 edema ; Pt. EKG completed prior to triage

## 2016-08-26 NOTE — ED Nurses Note (Signed)
Dr Ramiro HarvestParikh notified pt's b/p 160/105 and 202/114. Pt is due for hs meds now of hydralazine 100mg . Verified the dose with pt several times and pt was adimit that was the correct dose. Pt is End Stage Renal failure and has not urinated x 1 year. Pt continues to c/o pain of 10/10 in the chest and abd. Dr Ramiro HarvestParikh at bedside with pt.

## 2016-08-26 NOTE — ED Nurses Note (Signed)
Report given to Fifty Lakesandace, [name]. Pt stable and awaiting medications for b/p and pain.

## 2016-08-26 NOTE — ED Nurses Note (Signed)
Report given to Ardelle Antonasey Martin, RN. Pt to be transported via [facility] room ICU1 in approximately 30 minutes. Pt stable and on cardiac monitor, pulse ox and 2L NC 02. Transportation documentation completed.

## 2016-08-26 NOTE — ED Nurses Note (Signed)
Spoke with nursing supervisor at [facility]. Call placed to Dr. Elease HashimotoYospin nephrology on call.

## 2016-08-26 NOTE — ED Provider Notes (Signed)
[facility]  Emergency Department     HISTORY OF PRESENT ILLNESS     Date:  08/26/2016  Patient's Name:  [name]  Date of Birth:  [birth year]    Patient is a 46 y.o. male presenting with shortness of breath, abdominal pain, vomiting, diarrhea, and chest pain.   History provided by:  Patient  Shortness of Breath   Associated symptoms: chest pain and vomiting    Associated symptoms: no abdominal pain, no cough, no fever, no headaches, no neck pain and no sore throat    Abdominal Pain   Associated symptoms: chest pain, diarrhea, nausea, shortness of breath and vomiting    Associated symptoms: no chills, no constipation, no cough, no dysuria, no fever, no hematuria and no sore throat    Vomiting   Associated symptoms: diarrhea    Associated symptoms: no abdominal pain, no chills, no cough, no fever, no headaches and no sore throat    Diarrhea   Associated symptoms: vomiting    Associated symptoms: no abdominal pain, no chills, no fever and no headaches    Chest Pain    Associated symptoms: nausea, shortness of breath and vomiting    Associated symptoms: no abdominal pain, no back pain, no cough, no dizziness, no fever, no headache, no numbness and no weakness       46 y/o male presents to the ED c/o chest pain and SOB onset earlier today. Patient is a dialysis patient and receives treatment Monday, Wednesday and Friday and last received treatment 1 week ago on Friday. Patient states he was too sick to go on for treatment Monday or Wednesday. Patient denies of any fevers or chills. Patient notes of abdominal pain, nausea, vomiting and diarrhea.  Review of Systems     Review of Systems   Constitutional: Negative for chills and fever.   HENT: Negative for congestion, rhinorrhea and sore throat.    Respiratory: Positive for shortness of breath. Negative for cough.    Cardiovascular: Positive for chest pain.   Gastrointestinal: Positive for diarrhea, nausea and vomiting. Negative for  abdominal pain, blood in stool and constipation.   Genitourinary: Negative for dysuria, frequency, hematuria and urgency.   Musculoskeletal: Negative for back pain and neck pain.   Neurological: Negative for dizziness, syncope, weakness, light-headedness, numbness and headaches.   Psychiatric/Behavioral: Negative for confusion.   All other systems reviewed and are negative.    Previous History     Past Medical History:  Past Medical History:   Diagnosis Date    Ascites     Asthma     Dialysis patient (HCC)     HTN (hypertension)     Kidney failure     MI, old        Past Surgical History:  Past Surgical History:   Procedure Laterality Date    Av fistula placement      Hx coronary stent placement      Hx hernia repair         Social History:  Social History   Substance Use Topics    Smoking status: Current Every Day Smoker     Packs/day: 0.25     Types: Cigarettes    Smokeless tobacco: Never Used    Alcohol use No     History   Drug Use No       Family History:  No family history on file.    Medication History:  Current Outpatient Prescriptions  Medication Sig    albuterol sulfate 90 mcg/Actuation Inhalation HFA Aerosol Inhaler oral inhaler Take 1-2 Puffs by inhalation Every 6 hours as needed    amLODIPine (NORVASC) 10 mg Oral Tablet Take 10 mg by mouth Once a day    carvedilol (COREG) 25 mg Oral Tablet Take 25 mg by mouth Twice daily with food    furosemide (LASIX) 80 mg Oral Tablet Take 80 mg by mouth Once a day    hydrALAZINE (APRESOLINE) 100 mg Oral Tablet Take 100 mg by mouth Three times a day    pantoprazole (PROTONIX) 40 mg Oral Tablet, Delayed Release (E.C.) Take 40 mg by mouth Once a day    sevelamer (RENAGEL) 800 mg Oral Tablet Take 800 mg by mouth Three times daily with meals       Allergies:  Allergies   Allergen Reactions    Lisinopril      Lip swelling      Morphine      Throat swelling      Toradol [Ketorolac]      Breathing issues       Physical Exam     Vitals:    BP (!)  153/104   Pulse 100   Temp 36.8 C (98.3 F)   Resp 20   Ht 1.854 m (6\' 1" )   Wt 105.7 kg (233 lb)   SpO2 100%   BMI 30.74 kg/m2    Physical Exam    Constitutional:  Well developed, well nourished.  Awake & alert. No distress.  Head:  Atraumatic.  Normocephalic.    Eyes:  PERRL.  EOMI.  Conjunctivae are not pale.  ENT:  Mucous membranes are moist and intact.  Oropharynx is clear and symmetric.  Patent airway.  Neck:  Supple.  Full ROM.  No JVD.  No lymphadenopathy.  Cardiovascular:  Regular rate.  Regular rhythm.  No murmurs, rubs, or gallops.  Distal pulses are 2+ and symmetric.  Pulmonary/Chest:  No evidence of respiratory distress.  Clear to auscultation bilaterally.  No wheezing, rales or rhonchi. Chest non-tender.  Abdominal:  Soft and non-distended.  There is no tenderness.  No rebound, guarding, or rigidity.  No organomegaly.  Good bowel sounds.    Back:  No CVA tenderness. FROM.   Extremities:  No edema.   No cyanosis.  No clubbing.  Full range of motion in all extremities.  No calf tenderness.  Skin:  Skin is warm and dry.  No diaphoresis. No rash.   Neurological:  Alert, awake, and appropriate.  Normal speech.  Sensation normal. Motor strengths 5/5. CN II-XII intact.   Psychiatric:  Good eye contact.  Normal interaction, affect, and behavior.  Diagnostic Studies/Treatment     Medications:  Medications   NS flush syringe (not administered)   furosemide (LASIX) 10 mg/mL injection (40 mg Intravenous Given 08/26/16 1912)   metroNIDAZOLE (FLAGYL) tablet (500 mg Oral Given 08/26/16 1913)   HYDROmorphone (DILAUDID) 0.5 mg/0.5 mL injection (0.5 mg Intravenous Given 08/26/16 2106)   ondansetron (ZOFRAN) 2 mg/mL injection (4 mg Intravenous Given 08/26/16 2107)   hydrALAZINE (APRESOLINE) injection 10 mg (10 mg Intravenous Given 08/26/16 2106)       New Prescriptions    No medications on file       Labs:    Results for orders placed or performed during the hospital encounter of 08/26/16   COMPREHENSIVE METABOLIC PROFILE -  [facility] ONLY   Result Value Ref Range    SODIUM 140 136 - 145  mmol/L    POTASSIUM 5.3 (H) 3.4 - 5.1 mmol/L    CHLORIDE 106 101 - 111 mmol/L    CO2 TOTAL 24 22 - 32 mmol/L    ANION GAP 10 3 - 11 mmol/L    BUN 46 (H) 6 - 20 mg/dL    CREATININE 1.61 (H) 0.61 - 1.24 mg/dL    BUN/CREA RATIO 6 6 - 22    ESTIMATED GFR 9 (L) >60 mL/min/1.18m2    ALBUMIN 3.3 (L) 3.5 - 5.0 g/dL    CALCIUM 7.1 (L) 8.6 - 10.3 mg/dL    GLUCOSE 096 70 - 045 mg/dL    ALKALINE PHOSPHATASE 231 (H) 38 - 126 U/L    ALT (SGPT) 23 17 - 63 U/L    AST (SGOT) 39 15 - 41 U/L    BILIRUBIN TOTAL 0.5 0.3 - 1.2 mg/dL    PROTEIN TOTAL 6.2 (L) 6.4 - 8.3 g/dL    ALBUMIN/GLOBULIN RATIO 1.1 0.8 - 2.0   TROPONIN-I   Result Value Ref Range    TROPONIN I 0.06 0.00 - 0.06 ng/mL   PT/INR   Result Value Ref Range    PROTHROMBIN TIME 12.2 9.2 - 12.3 seconds    INR 1.10    PTT (PARTIAL THROMBOPLASTIN TIME)   Result Value Ref Range    APTT 32.4 25.0 - 36.8 seconds   B-TYPE NATRIURETIC PEPTIDE   Result Value Ref Range    BNP 4750 (H) 0 - 100 pg/mL   CBC WITH DIFF   Result Value Ref Range    WBC 5.8 4.0 - 11.0 x103/uL    RBC 2.49 (L) 4.40 - 5.80 x106/uL    HGB 7.9 (L) 13.5 - 18.0 g/dL    HCT 40.9 (L) 81.1 - 50.0 %    MCV 94.5 82.0 - 97.0 fL    MCH 31.8 27.5 - 33.2 pg    MCHC 33.7 32.0 - 36.0 g/dL    RDW 91.4 (H) 78.2 - 16.0 %    PLATELETS 118 (L) 150 - 450 x103/uL    MPV 7.6 7.4 - 10.5 fL    NEUTROPHIL % 84 (H) 43 - 76 %    LYMPHOCYTE % 7 (L) 15 - 43 %    MONOCYTE % 8 5 - 12 %    EOSINOPHIL % 0 0 - 5 %    BASOPHIL % 1 0 - 3 %    NEUTROPHIL # 4.90 1.50 - 6.50 x103/uL    LYMPHOCYTE # 0.40 (L) 1.00 - 4.80 x103/uL    MONOCYTE # 0.50 0.20 - 0.90 x103/uL    EOSINOPHIL # 0.00 0.00 - 0.50 x103/uL    BASOPHIL # 0.00 0.00 - 0.10 x103/uL   ECG 12-LEAD   Result Value Ref Range    Ventricular rate 114 BPM    Atrial Rate 114 BPM    PR Interval 164 ms    QRS Duration 82 ms    QT Interval 350 ms    QTC Calculation 482 ms    Calculated P Axis 70 degrees    Calculated R Axis -4 degrees     Calculated T Axis 94 degrees       Radiology:  XR CHEST AP PORTABLE  XR CHEST AP PORTABLE    (Results Pending)   Imaging Studies: Imaging studies were ordered. Results contemporaneously interpreted by me:  XR CHEST AP PORTABLE : NAD     ECG:  EKG Results     ECG 12-LEAD    Collection  Time: 1831   Result Value    Ventricular rate 114    PR Interval 164    QRS Duration 82    QT Interval 350    QTC Calculation 482    Narrative: Sinus tachycardia. No STEMI.    Confirmed by: Sudie Grumbling, DO     Procedure     Procedures    Course/Disposition/Plan     Course:  Chart review was completed.    Initial Evaluation:  18:39: Initial evaluation is complete at this time. I discussed with the patient that I would order blood work, ECG and chest x-ray to further evaluate. I will reevaluate to check the patient's progress after treatment. Patient is agreeable with the treatment plan at this time.    Reevaluation/Course Notes:  19:28: On recheck, the patient continues to do well here in the ED. I counseled the patient regarding  lab and radiology results. I answered questions to their satisfaction.    20:12: Discussed  Case with Dr. Elease Hashimoto, nephrologist, accepted patient for transfer.    20:32: Dr. Verlin Dike accepted patient for transfer at [facility].    Disposition:    Transfered to Another Facility    Condition at Disposition:   Stable      Follow up:   No follow-up provider specified.    Clinical Impression:     Encounter Diagnoses   Name Primary?    Hypervolemia, unspecified hypervolemia type Yes    End stage renal disease on dialysis (HCC)     Anemia, unspecified type     HTN (hypertension)     Clostridium difficile infection        Future Appointments Scheduled in Epic:  No future appointments.  SCRIBE ATTESTATION   This note is prepared by Letitia Caul, acting as Scribe for Dr. Ramiro Harvest.    The scribe's documentation has been prepared under my direction and personally reviewed by me in its entirety.  I confirm that the note above  accurately reflects all work, treatment, procedures, and medical decision making performed by me, Dr. Ramiro Harvest.

## 2016-08-26 NOTE — ED Nurses Note (Signed)
[facility] nursing supervisor called for hospitalist.

## 2016-08-27 ENCOUNTER — Other Ambulatory Visit (HOSPITAL_BASED_OUTPATIENT_CLINIC_OR_DEPARTMENT_OTHER): Payer: Self-pay

## 2016-08-27 ENCOUNTER — Inpatient Hospital Stay (HOSPITAL_BASED_OUTPATIENT_CLINIC_OR_DEPARTMENT_OTHER): Payer: Medicare Other

## 2016-08-27 LAB — RENAL FUNCTION PANEL
ALBUMIN: 3.4 g/dL — ABNORMAL LOW (ref 3.5–5.0)
ANION GAP: 13 mmol/L — ABNORMAL HIGH (ref 3–11)
BUN/CREA RATIO: 6 (ref 6–22)
BUN: 47 mg/dL — ABNORMAL HIGH (ref 6–20)
CALCIUM: 7 mg/dL — ABNORMAL LOW (ref 8.6–10.3)
CHLORIDE: 102 mmol/L (ref 101–111)
CO2 TOTAL: 24 mmol/L (ref 22–32)
CREATININE: 7.8 mg/dL — ABNORMAL HIGH (ref 0.61–1.24)
ESTIMATED GFR: 9 mL/min/1.73mˆ2 — ABNORMAL LOW (ref >60)
GLUCOSE: 106 mg/dL (ref 70–110)
PHOSPHORUS: 4.6 mg/dL — ABNORMAL HIGH (ref 2.7–4.5)
POTASSIUM: 5.1 mmol/L (ref 3.4–5.1)
SODIUM: 139 mmol/L (ref 136–145)

## 2016-08-27 LAB — ECG 12-LEAD
Atrial Rate: 114 {beats}/min
Atrial Rate: 75 {beats}/min
Calculated P Axis: 70 degrees
Calculated R Axis: -4 degrees
Calculated R Axis: 57 degrees
Calculated T Axis: 94 degrees
Calculated T Axis: -30 degrees
PR Interval: 164 ms
PR Interval: 180 ms
QRS Duration: 82 ms
QRS Duration: 80 ms
QT Interval: 350 ms
QT Interval: 428 ms
QTC Calculation: 482 ms
QTC Calculation: 477 ms
Ventricular rate: 114 {beats}/min
Ventricular rate: 75 {beats}/min

## 2016-08-27 LAB — CBC WITH DIFF
BASOPHIL #: 0 x10ˆ3/uL (ref 0.00–0.10)
BASOPHIL #: 0 x10ˆ3/uL (ref 0.00–0.10)
BASOPHIL %: 1 % (ref 0–3)
BASOPHIL %: 1 % (ref 0–3)
EOSINOPHIL #: 0 x10ˆ3/uL (ref 0.00–0.50)
EOSINOPHIL #: 0.1 x10ˆ3/uL (ref 0.00–0.50)
EOSINOPHIL %: 1 % (ref 0–5)
EOSINOPHIL %: 2 % (ref 0–5)
HCT: 23.9 % — ABNORMAL LOW (ref 40.0–50.0)
HCT: 23.9 % — ABNORMAL LOW (ref 40.0–50.0)
HGB: 7.9 g/dL — ABNORMAL LOW (ref 13.5–18.0)
HGB: 8 g/dL — ABNORMAL LOW (ref 13.5–18.0)
LYMPHOCYTE #: 0.6 x10ˆ3/uL — ABNORMAL LOW (ref 1.00–4.80)
LYMPHOCYTE #: 0.6 x10ˆ3/uL — ABNORMAL LOW (ref 1.00–4.80)
LYMPHOCYTE %: 10 % — ABNORMAL LOW (ref 15–43)
LYMPHOCYTE %: 9 % — ABNORMAL LOW (ref 15–43)
MCH: 31.8 pg (ref 27.5–33.2)
MCH: 32.1 pg (ref 27.5–33.2)
MCHC: 33 g/dL (ref 32.0–36.0)
MCHC: 33.4 g/dL (ref 32.0–36.0)
MCV: 96.2 fL (ref 82.0–97.0)
MCV: 96.3 fL (ref 82.0–97.0)
MONOCYTE #: 0.5 x10ˆ3/uL (ref 0.20–0.90)
MONOCYTE #: 0.5 x10ˆ3/uL (ref 0.20–0.90)
MONOCYTE %: 8 % (ref 5–12)
MONOCYTE %: 8 % (ref 5–12)
MPV: 7.4 fL (ref 7.4–10.5)
MPV: 7.6 fL (ref 7.4–10.5)
NEUTROPHIL #: 4.5 x10ˆ3/uL (ref 1.50–6.50)
NEUTROPHIL #: 5.3 x10ˆ3/uL (ref 1.50–6.50)
NEUTROPHIL %: 80 % — ABNORMAL HIGH (ref 43–76)
NEUTROPHIL %: 82 % — ABNORMAL HIGH (ref 43–76)
PLATELETS: 112 x10ˆ3/uL — ABNORMAL LOW (ref 150–450)
PLATELETS: 118 x10ˆ3/uL — ABNORMAL LOW (ref 150–450)
RBC: 2.48 x10ˆ6/uL — ABNORMAL LOW (ref 4.40–5.80)
RBC: 2.49 x10ˆ6/uL — ABNORMAL LOW (ref 4.40–5.80)
RDW: 18.9 % — ABNORMAL HIGH (ref 11.0–16.0)
RDW: 18.9 % — ABNORMAL HIGH (ref 11.0–16.0)
RDW: 19 % — ABNORMAL HIGH (ref 11.0–16.0)
WBC: 5.7 x10ˆ3/uL (ref 4.0–11.0)
WBC: 6.5 x10ˆ3/uL (ref 4.0–11.0)

## 2016-08-27 LAB — COMPREHENSIVE METABOLIC PROFILE - BMC/JMC ONLY
ALBUMIN/GLOBULIN RATIO: 1.3 (ref 0.8–2.0)
ALBUMIN: 3.5 g/dL (ref 3.5–5.0)
ALKALINE PHOSPHATASE: 212 U/L — ABNORMAL HIGH (ref 38–126)
ALT (SGPT): 21 U/L (ref 17–63)
ANION GAP: 13 mmol/L — ABNORMAL HIGH (ref 3–11)
AST (SGOT): 29 U/L (ref 15–41)
BILIRUBIN TOTAL: 0.7 mg/dL (ref 0.3–1.2)
BUN/CREA RATIO: 6 (ref 6–22)
BUN: 49 mg/dL — ABNORMAL HIGH (ref 6–20)
CALCIUM: 6.8 mg/dL — CL (ref 8.6–10.3)
CHLORIDE: 104 mmol/L (ref 101–111)
CO2 TOTAL: 25 mmol/L (ref 22–32)
CREATININE: 8.06 mg/dL — ABNORMAL HIGH (ref 0.61–1.24)
ESTIMATED GFR: 9 mL/min/1.73m?2 — ABNORMAL LOW (ref >60)
GLUCOSE: 87 mg/dL (ref 70–110)
POTASSIUM: 5 mmol/L (ref 3.4–5.1)
PROTEIN TOTAL: 6.2 g/dL — ABNORMAL LOW (ref 6.4–8.3)
SODIUM: 142 mmol/L (ref 136–145)

## 2016-08-27 LAB — MAGNESIUM
MAGNESIUM: 2 mg/dL (ref 1.4–2.1)
MAGNESIUM: 2.1 mg/dL (ref 1.4–2.1)

## 2016-08-27 LAB — PT/INR
INR: 1.08
PROTHROMBIN TIME: 11.9 s (ref 9.4–12.5)

## 2016-08-27 LAB — TROPONIN-I
TROPONIN I: 0.06 ng/mL (ref <=0.06)
TROPONIN I: 0.06 ng/mL (ref <=0.06)
TROPONIN I: 0.06 ng/mL (ref <=0.06)

## 2016-08-27 LAB — PHOSPHORUS: PHOSPHORUS: 5.5 mg/dL — ABNORMAL HIGH (ref 2.7–4.5)

## 2016-08-27 LAB — TRANSFERRIN SATURATION PANEL
IRON (TRANSFERRIN) SATURATION: 46 % (ref 20–50)
IRON: 138 ug/dL (ref 45–182)
TOTAL IRON BINDING CAPACITY: 301 ug/dL
TRANSFERRIN: 211 mg/dL (ref 180–329)

## 2016-08-27 LAB — TYPE AND SCREEN
ABO/RH(D): O POS
ANTIBODY SCREEN: NEGATIVE

## 2016-08-27 LAB — FERRITIN: FERRITIN: 1450 ng/mL — ABNORMAL HIGH (ref 24–336)

## 2016-08-27 LAB — PTT (PARTIAL THROMBOPLASTIN TIME): APTT: 29.9 s (ref 25.1–36.5)

## 2016-08-27 LAB — HEPATITIS B SURFACE ANTIGEN: HBV SURFACE ANTIGEN QUALITATIVE: NEGATIVE

## 2016-08-27 LAB — OCCULT BLOOD, STOOL: OCCULT BLOOD: POSITIVE — AB

## 2016-08-27 MED ORDER — SODIUM CITRATE 4 GRAM/100 ML (4 %) SOLUTION
2.0000 | Status: AC
Start: 2016-08-27 — End: 2016-08-27
  Administered 2016-08-27: 2
  Filled 2016-08-27 (×2): qty 5

## 2016-08-27 MED ORDER — LABETALOL 20 MG/4 ML (5 MG/ML) INTRAVENOUS SYRINGE
20.00 mg | INJECTION | INTRAVENOUS | Status: DC | PRN
Start: 2016-08-27 — End: 2016-08-30
  Filled 2016-08-27 (×5): qty 4

## 2016-08-27 MED ORDER — CALCITRIOL 1 MCG/ML INTRAVENOUS SOLUTION
2.00 ug | INTRAVENOUS | Status: AC
Start: 2016-08-27 — End: 2016-08-27
  Administered 2016-08-27: 2 ug via INTRAVENOUS
  Filled 2016-08-27: qty 2

## 2016-08-27 MED ORDER — VANCOMYCIN 25 MG/ML ORAL LIQUID WITH CHERRY FLAVORING
250.00 mg | Freq: Four times a day (QID) | ORAL | Status: DC
Start: 2016-08-27 — End: 2016-08-29
  Administered 2016-08-27: 250 mg via ORAL
  Administered 2016-08-27 (×2): 0 mg via ORAL
  Administered 2016-08-27 – 2016-08-29 (×6): 250 mg via ORAL
  Filled 2016-08-27 (×14): qty 10

## 2016-08-27 MED ORDER — CALCIUM ACETATE(PHOSPHATE BINDERS) 667 MG CAPSULE
1334.00 mg | ORAL_CAPSULE | Freq: Three times a day (TID) | ORAL | Status: DC
Start: 2016-08-27 — End: 2016-08-30
  Administered 2016-08-27 (×2): 0 mg via ORAL
  Administered 2016-08-28 – 2016-08-29 (×5): 1334 mg via ORAL
  Administered 2016-08-29 – 2016-08-30 (×3): 0 mg via ORAL
  Filled 2016-08-27 (×6): qty 2

## 2016-08-27 MED ORDER — IRON SUCROSE 100 MG IRON/5 ML INTRAVENOUS SOLUTION
100.00 mg | INTRAVENOUS | Status: AC
Start: 2016-08-27 — End: 2016-08-27
  Administered 2016-08-27: 100 mg via INTRAVENOUS
  Filled 2016-08-27: qty 5

## 2016-08-27 MED ORDER — LEVOCARNITINE 200 MG/ML INTRAVENOUS SOLUTION
20.00 mg/kg | INTRAVENOUS | Status: AC
Start: 2016-08-27 — End: 2016-08-27
  Administered 2016-08-27: 1600 mg via INTRAVENOUS
  Filled 2016-08-27: qty 10

## 2016-08-27 MED ORDER — ONDANSETRON HCL (PF) 4 MG/2 ML INJECTION SOLUTION
4.00 mg | Freq: Four times a day (QID) | INTRAMUSCULAR | Status: DC | PRN
Start: 2016-08-27 — End: 2016-08-30
  Administered 2016-08-27 – 2016-08-28 (×2): 4 mg via INTRAVENOUS
  Filled 2016-08-27 (×2): qty 2

## 2016-08-27 MED ORDER — DARBEPOETIN ALFA 100 MCG/0.5 ML IN POLYSORBATE INJECTION SYRINGE
100.00 ug | INJECTION | INTRAMUSCULAR | Status: AC
Start: 2016-08-27 — End: 2016-08-27
  Administered 2016-08-27 (×2): 100 ug via INTRAVENOUS
  Filled 2016-08-27: qty 0.5

## 2016-08-27 MED ADMIN — sodium chloride 0.9 % intravenous solution: ORAL | @ 10:00:00

## 2016-08-27 MED ADMIN — calcium acetate(phosphate binders) 667 mg capsule: ORAL | @ 12:00:00

## 2016-08-27 MED ADMIN — sodium chloride 0.9 % (flush) injection syringe: INTRAVENOUS | @ 21:00:00

## 2016-08-27 MED ADMIN — heparin (porcine) 5,000 unit/mL injection solution: SUBCUTANEOUS | @ 06:00:00

## 2016-08-27 MED ADMIN — sodium chloride 0.9 % (flush) injection syringe: @ 19:00:00

## 2016-08-27 MED ADMIN — methylPREDNISolone sodium succinate 40 mg solution for injection: ORAL | @ 18:00:00

## 2016-08-27 NOTE — Nurses Notes (Signed)
Pt c/o SOB. Assisted to recliner to aid breathing. Pt state he feels much better sitting up.

## 2016-08-27 NOTE — Nurses Notes (Signed)
08/27/16 1600   Cognitive   Cognitive/Neuro/Behavioral WDL WDL   Pupils   Pupil PERRLA yes   Pupil Size Left 3 mm   Pupil Shape Left round   Pupil Reaction Left brisk;equal   Pupil Accommodation Left normal response   Pupil Size Right 3 mm   Pupil Shape Right round   Pupil Reaction Right brisk;equal   Glasgow Coma Scale   Best Eye Response 4-->(E4) spontaneous   Best Motor Response 6-->(M6) obeys commands   Best Verbal Response 5-->(V5) oriented   Glasgow Coma Scale Score 15   Pain   Pain Assessment Intervention Reassessment   Pain Scale Used N   Pain Score (Numeric, Faces) 3   Johns Hopkins Fall Risk Assessment Tool   Fall Risk Assessment Shift Assessment   Fall Risk- Implement fall risk interventions per protocol 0   Age 46   Fall History 0   Elimination, Bowel and Urine 0   Medications (Fall risk drugs: PCA/Opiates, Anti-Convulsants, Anti-Hypertensives, Diuretics, Hypnotics, Laxatives, Sedatives and Psychotropics) 3   Patient Care Equipment 1   Mobility (Yes on any of these rows will result in High Fall Risk)   Requires assistance or supervision for mobility, transfer, or ambulation 2   Unsteady Gait 0   Visual or auditory impairment affecting mobility 0   Cognition (Yes on any of these rows will result in High Fall Risk)   Altered awareness of immediate physical environment 0   Impulsive 0   Lack of understanding of one's physical and cognitive limitations 0   Johns Hopkins Score and Interventions   Johns Hopkins Score Total 6   Identified Fall Risk Level Moderate Risk (6-13 total points & absence of Yes in Mobility and Cognition Rows)   Fall Risk Interventions (All That Apply) (L,M,H) Universal Falls Precautions;(M,H) Institute/Continue Flagging System;(M,H) Monitor and assist with ADLs   Safety   Safety Factors bed in low position;wheels locked;call light in reach;ID band on;fall risk slippers on;fall risk band on   ID Band/ Code Status (1st) Checked   Bed Type Speciality   What Specialty Bed is Being Used?  ICU Stryker   Aspiration Risk Screen no risk factors   Airway Safety Measures manual resuscitator/mask/valve in room;suction at bedside   Medical Device Protection tubing secured   All Alarms alarm(s) activated and audible   Hourly rounds completed Yes   Safety Precautions emergency equipment at bedside   Precautions   Precautions Fall;Skin;Isolation   HEENT   HEENT WDL ex;vision aid   Vision Aid glasses on   Mouth/Teeth WDL   Mouth/Teeth WDL WDL   Cardiac   Cardiac WDL WDL   Cardiac Rhythm apical pulse regular;radial pulse regular   Heart Sounds S1, S2   ECG   Lead Monitored Lead II   Rhythm normal sinus rhythm  (80's-90's)   Peripheral Neurovascular   Peripheral Neurovascular WDL WDL   VTE Prevention/Management patient refused intervention   Ankle, Left Edema 1+ (Trace)   Ankle, Right Edema 1+ (Trace)   Foot, Left Edema 1+ (Trace)   Foot, Right Edema 1+ (Trace)   Radial Pulse   Left Radial Pulse 2+ (normal);palpation   Right Radial Pulse 2+ (normal);palpation   Dorsalis Pedis Pulse   Left Dorsalis Pedis Pulse 2+ (normal);palpation   Right Dorsalis Pedis Pulse 2+ (normal);palpation   Respiratory   Respiratory WDL ex;rhythm/pattern   Dyspnea Occurrence With exertion   Rhythm/Pattern, Respiratory shortness of breath reported   Expansion/Accessory Muscles/Retractions no use of accessory muscles  Breath Sounds   L General Breath Sounds Clear;Diminished   R General Breath Sounds Clear;Diminished   Nutrition   Diet/Nutrition Received 2 gram sodium;other (see comments)   Specialty Diet/Nutrition Received renal diet   Gastrointestinal   GI WDL ex;appearance/characteristics;GI symptoms;nausea and vomiting   Abdominal Appearance distended;rounded   Abdominal Palpation All Quadrants   All Quadrants Abdominal Palpation firm;tender   Bowel Sounds All Quadrants   All Quadrants Bowel Sounds audible and normoactive   Genitourinary   Genitourinary WDL ex;voiding ability/characteristics   Voiding Characteristics anuria;patient on  hemodialysis   Musculoskeletal   Musculoskeletal WDL ex   General Mobility generalized weakness;mildly impaired   Daily Care   Activity Management bedrest   Activity Assistance Provided assistance, stand-by   Symptoms Noted During/After Activity shortness of breath   Positioning   Body Position supine, head elevated;lower extremity elevated, right;lower extremity elevated, left   Head of Bed (HOB) HOB at 60-90 degrees   Positioning/Transfer Devices pillows;in use   Pressure Reduction Devices specialty bed utilized;pressure-redistributing mattress utilized;positioning supports utilized   Pressure Reduction Techniques frequent weight shift encouraged;weight shift assistance provided   Hygiene Care   Bathing/Skin Care antimicrobial wipes;linen changed   Perineal Care perineum cleansed   Hygiene Assistance per patient with 1 assist   Daily Care Interventions   Self-Care Promotion independence encouraged;BADL personal objects within reach;BADL personal routines maintained   Skin   Skin WDL ex   Skin Integrity drain/device(s)  (scars)   Braden Risk Assessment   Sensory Perception 4-->no impairment   Moisture 4-->rarely moist   Activity 3-->walks occasionally   Mobility 3-->slightly limited   Nutrition 2-->probably inadequate   Friction and Shear 3-->no apparent problem   Braden Score 19   Skin Interventions   Skin Protection adhesive use limited;antimicrobial wipes;tubing/devices free from skin contact   Coping/Psychosocial   Observed Emotional State accepting;calm;cooperative   Verbalized Emotional State acceptance   Plan Of Care Reviewed With patient   Psychosocial Support   Trust Relationship/Rapport care explained;choices provided;emotional support provided;empathic listening provided;questions answered;reassurance provided;questions encouraged;thoughts/feelings acknowledged   Patient currently receiving HD, tolerating well.  Monitoring BP every 15 minutes. Junius Creamer, RN

## 2016-08-27 NOTE — Nurses Notes (Signed)
Pt report to Junius CreamerJessica Wiles, RN. Bedside rounding complete. Opportunity for questions/ concerns.

## 2016-08-27 NOTE — Nurses Notes (Signed)
08/27/16 1900   Post Dialysis Vitals   Weight (Post Dialysis) 103.6 kg (228 lb 6.3 oz)   Weight Source Bed   Temp 36.6 C (97.9 F)   Temp Source Oral   BP (!) 169/103   BP Source (Non-Invasive) RA;C   Pulse 89   RR 16   End Treatment Kt/V 1.31     Hemodialysis:    Patient set up for 4 hours HD as ordered. LIJ catheter clean, dry, intact, dressing changed.  Patient had 3.5 hours of dialysis, refused to finish the prescribed time despite education and encouragement by this RN.  Dr. Webb SilversmithWelch notified.  BP elevated and had cramping on left hand and stomach at the last hour relieved by decreasing UFG.  Total fluid removed : 3,480 ML, BVP 79.2.  VS stable, report given to Sheria LangKevin J. RN.

## 2016-08-27 NOTE — H&P (Signed)
[facility]  General Medicine  ICU Admission H&P    Date of Service:  08/26/16  [name], [name], 46 y.o. male  Encounter Start Date:  08/26/2016  Inpatient Admission Date: 08/26/2016  Date of Birth:  01/25/1971  PCP: No Pcp    Information Obtained from: patient and history reviewed via medical record  Chief Complaint:  Shortness of Breath    HPI: [name] is a 46 y.o., Black/African American male who presents in transfer from [facility] with multiple complaints including shortness of breath.  Patient relates that over the past several days he has developed increasing shortness of breath, abdominal pain, bloating and recurrent diarrhea.  He has had some chest pressure as well.  He denies fever chills, hematochezia, vomiting, headache, cough, dysuria, hematuria.  He does admit to several episodes of vomiting over the last several days and no evidence of hematemesis.  Patient has a history of end-stage renal disease and is on chronic hemodialysis in [place].  He relates that he is in our area visiting his sister and has not had dialysis since last Friday.  His usual scheduled days are Monday Wednesday Friday.  When asked why he did not return for his dialysis treatments he states he has had this recurrent diarrhea and vomiting and felt that he could not tolerate going through dialysis.  He relates he had an episode of C diff diarrhea 1 month ago and is concerned that has recurred.  He has had no bowel movements since arrival at [facility].  Evaluation in the ED were consistent with his known chronic kidney disease, chronic anemia and slight elevation in his troponin I at 0.06, BNP of 4750 and acceptable left electrolytes.  His chest x-ray shows some mild perihilar congestion and is exam suggestive of volume overload.  Transfer to our facility for nephrology consultation and urgent dialysis.  He has also been noted to have significant elevation in his blood  pressure.  He does state that he has been taking his prescribed medications.  On arrival to our institution he is obviously uncomfortable with significant tachypnea and complaint of abdominal pain.      PAST MEDICAL:    Past Medical History:   Diagnosis Date   . Ascites    . Asthma    . Dialysis patient (HCC)    . HTN (hypertension)    . Kidney failure    . MI, old        Past Surgical History:   Procedure Laterality Date   . AV FISTULA PLACEMENT     . HX CORONARY STENT PLACEMENT     . HX HERNIA REPAIR         Medications Prior to Admission     Prescriptions    albuterol sulfate 90 mcg/Actuation Inhalation HFA Aerosol Inhaler oral inhaler    Take 1-2 Puffs by inhalation Every 6 hours as needed    amLODIPine (NORVASC) 10 mg Oral Tablet    Take 10 mg by mouth Once a day    carvedilol (COREG) 25 mg Oral Tablet    Take 25 mg by mouth Twice daily with food    furosemide (LASIX) 80 mg Oral Tablet    Take 80 mg by mouth Once a day    hydrALAZINE (APRESOLINE) 100 mg Oral Tablet    Take 100 mg by mouth Three times a day    pantoprazole (PROTONIX) 40 mg Oral Tablet, Delayed Release (E.C.)    Take 40  mg by mouth Once a day    sevelamer (RENAGEL) 800 mg Oral Tablet    Take 800 mg by mouth Three times daily with meals        Allergies   Allergen Reactions   . Lisinopril      Lip swelling     . Morphine      Throat swelling     . Toradol [Ketorolac]      Breathing issues     Vaccinations:        Pneumovax: Will order pneumovax    Influenza: Not current vaccination season.    Family History  CAD, CVA, DM,     Social History  Social History     Social History   . Marital status: Single     Spouse name: N/A   . Number of children: N/A   . Years of education: N/A     Occupational History   . Not on file.     Social History Main Topics   . Smoking status: Current Every Day Smoker     Packs/day: 0.25     Types: Cigarettes   . Smokeless tobacco: Never Used   . Alcohol use No   . Drug use: No   . Sexual activity: Not on file     Other  Topics Concern   . Not on file     Social History Narrative       ROS: Other than ROS in the HPI, all other systems were negative.    Examination:  Temperature: 36.7 C (98 F)  Heart Rate: 85  BP (Non-Invasive): (!) 140/106  Respiratory Rate: 17  SpO2-1: 95 %  Pain Score (Numeric, Faces): 10  General: acutely ill, moderately obese, appears stated age, moderate distress and vital signs reviewed and discussed with patient  Eyes: Conjunctiva clear., Pupils equal and round, reactive to light and accomodation. , Sclera non-icteric.   HENT:ENMT without erythema or injection, mucous membranes moist.  Neck: no thyromegaly or lymphadenopathy  Lungs: decreased breath sounds bibasilar, rales bibasilar  Cardiovascular: regular rate and rhythm  S1, S2 normal  S4 present  no murmurs  no rub  Abdomen: distended, No hepatosplenomegaly, Bowel sounds: Hyperactive, Palpation: Tenderness: LLQ, diffusely, No masses  Genito-urinary: Deferred  Extremities: No synovitis or joint effusions, pedal edema 2+ bilateral  Skin: Skin warm and dry and No rashes  Neurologic: CN II - XII grossly intact , Normal mental status, sensory, motor, cranial nerves, reflexes, coordination, and gait., Alert and oriented x3, No tremor  Lymphatics: No lymphadenopathy  Psychiatric: Normal affect, behavior, memory, thought content, judgement, and speech.    Labs:    I have reviewed all lab results.  Outside labs reviewed and abnormal findings noted.  Lab Results for Last 24 Hours:    Results for orders placed or performed during the hospital encounter of 08/26/16 (from the past 24 hour(s))   TROPONIN-I   Result Value Ref Range    TROPONIN I 0.06 <=0.06 ng/mL   TYPE AND SCREEN   Result Value Ref Range    UNITS ORDERED NOT STATED         ABO/RH(D) O POSITIVE     ANTIBODY SCREEN NEGATIVE     SPECIMEN EXPIRATION DATE 08/29/2016    RENAL FUNCTION PANEL   Result Value Ref Range    SODIUM 139 136 - 145 mmol/L    POTASSIUM 5.1 3.4 - 5.1 mmol/L    CHLORIDE 102 101 - 111  mmol/L  CO2 TOTAL 24 22 - 32 mmol/L    ANION GAP 13 (H) 3 - 11 mmol/L    BUN 47 (H) 6 - 20 mg/dL    CREATININE 1.61 (H) 0.61 - 1.24 mg/dL    BUN/CREA RATIO 6 6 - 22    ESTIMATED GFR 9 (L) >60 mL/min/1.14m^2    CALCIUM 7.0 (L) 8.6 - 10.3 mg/dL    GLUCOSE 096 70 - 045 mg/dL    PHOSPHORUS 4.6 (H) 2.7 - 4.5 mg/dL    ALBUMIN 3.4 (L) 3.5 - 5.0 g/dL   CBC WITH DIFF   Result Value Ref Range    WBC 6.5 4.0 - 11.0 x10^3/uL    RBC 2.49 (L) 4.40 - 5.80 x10^6/uL    HGB 8.0 (L) 13.5 - 18.0 g/dL    HCT 40.9 (L) 81.1 - 50.0 %    MCV 96.2 82.0 - 97.0 fL    MCH 32.1 27.5 - 33.2 pg    MCHC 33.4 32.0 - 36.0 g/dL    RDW 91.4 (H) 78.2 - 16.0 %    PLATELETS 118 (L) 150 - 450 x10^3/uL    MPV 7.4 7.4 - 10.5 fL    NEUTROPHIL % 82 (H) 43 - 76 %    LYMPHOCYTE % 9 (L) 15 - 43 %    MONOCYTE % 8 5 - 12 %    EOSINOPHIL % 1 0 - 5 %    BASOPHIL % 1 0 - 3 %    NEUTROPHIL # 5.30 1.50 - 6.50 x10^3/uL    LYMPHOCYTE # 0.60 (L) 1.00 - 4.80 x10^3/uL    MONOCYTE # 0.50 0.20 - 0.90 x10^3/uL    EOSINOPHIL # 0.00 0.00 - 0.50 x10^3/uL    BASOPHIL # 0.00 0.00 - 0.10 x10^3/uL   MAGNESIUM   Result Value Ref Range    MAGNESIUM 2.0 1.4 - 2.1 mg/dL       Imaging Studies:  CXR:  Mild pulmonary edema    ECG:  26-Aug-2016 18:31:13 [facility]  Sinus tachycardia  Possible Left atrial enlargement  Nonspecific T wave abnormality  Abnormal ECG  No previous ECGs available    DNR Status:  Full Code    Assessment/Plan:   Active Hospital Problems   (*Primary Problem)    Diagnosis   . *Acute pulmonary edema (HCC)   . Volume overload   . ESRD (end stage renal disease) on dialysis (HCC)     Chronic   . Noncompliance with renal dialysis (HCC)   . C. difficile diarrhea   . Accelerated hypertension   . Anemia of chronic renal failure, stage 5 (HCC)     Chronic       Acute pulmonary edema  --secondary to volume overload from missed dialysis  --there is some concern with his chest pressure  --serial troponin I  --will attempt to stimulate some diuresis with Lasix   --consult Nephrology for urgent dialysis in a.m.  --monitor in the ICU  --supplemental oxygen    Accelerated hypertension  --resume prescribed meds per his home med list  --p.r.n.  Labetalol IV  --attempt control of blood pressure overnight  --urgent dialysis    End-stage renal disease on dialysis  --obvious noncompliance with renal dialysis  --as above    Abdominal pain, nausea and diarrhea  --patient with history of recent C diff diarrhea  --will check GI bio fire if patient has bowel movement  --patient's abdomen is distended and tender especially in the left lower quadrant  --have  advised urgent CT scan of the abdomen to assess for bowel obstruction or ileus    Anemic of chronic renal failure  --H&H is 7.9 and 23.6  --patient relates that his typical Hgn is in the high 8s to low 9s  --may be delusional due to volume overload  --monitor H&H  --stool for occult blood  --type and screen    DVT/PE Prophylaxis: Heparin  GI prophylaxis:  Ppi    Cc time 40 min    Karolee Ohs Neizan Debruhl, DO

## 2016-08-27 NOTE — Care Management Notes (Signed)
Called and spoke with [name] at [facility]  She verbalized that pt was discharged in May 07, 2016 for noncompliance with Dialysis.  I inquired if the physician will accept him back.  She asked Dr. Chelsea AusNourelvenn, and answer is NO.  She also informed me that pt was discharged from Nephrologist Dr. Lonn GeorgiaKo [facility]([place]).  I attempted to call Dr. Lonn GeorgiaKo office, but they are closed on Fridays.

## 2016-08-27 NOTE — Care Management Notes (Signed)
08/27/16 1204   Assessment Detail   Assessment Type Admission   Date of Care Management Update 08/27/16   Social Work Plan   Discharge Planning Status initial meeting   Projected Discharge Date 08/31/16   Anticipated Discharge Disposition (outpt HD, noncompliant with nephrology d/cd him from service)     Consult received for outpt dialysis.  Spoke with pt about discharge needs.  [place] alone, [place].  He does drive, and states [facility].  Pt is independent with ADLs, with use of home O2 prn.  He verbalized that he has been noncompliant with dialysis d/t having a job, and working late.  He has been on disability x 6months.  [place]rolog[place] discharged [place] for non compliance.  Will contact nephrologist, and eval for readmission, and acceptance.

## 2016-08-27 NOTE — Nurses Notes (Signed)
Patient's BP continues to read high throughout dialysis.  BP cuff site adjusted.  Per HD RN, patient may need to take BP meds held today for HD.  Patient stated, "I will not take any BP meds until dialysis is finished. Junius CreamerJessica Yania Bogie, RN

## 2016-08-27 NOTE — Nurses Notes (Signed)
Report given to Sheria LangKevin, J., RN. Junius CreamerJessica Pacen Watford, RN

## 2016-08-27 NOTE — Nurses Notes (Signed)
Patient ambulated to and from bathroom with standby assist, tolerated well.  Continent of moderate loose stool to toilet per patient, unable to visualize or obtain specimen as patient removed collection device from toilet and then flushed before calling for assistance.  Stated, "I did not know that thing was in the toilet for me to use."  Instructed to leave collection device in toilet next time, verbalized understanding.  Junius CreamerJessica Emmalea Treanor, RN

## 2016-08-27 NOTE — Care Management Notes (Signed)
SS: I entered the patient's chart to review if there were Advanced Directives on file. The patient does not have a Medical Power of Attorney or Health Care Surrogate listed.

## 2016-08-27 NOTE — Nurses Notes (Signed)
08/27/16 0800   Cognitive   Cognitive/Neuro/Behavioral WDL WDL   Pupils   Pupil PERRLA yes   Pupil Size Left 3 mm   Pupil Shape Left round   Pupil Reaction Left brisk;equal   Pupil Accommodation Left normal response   Pupil Size Right 3 mm   Pupil Shape Right round   Pupil Reaction Right brisk;equal   Glasgow Coma Scale   Best Eye Response 4-->(E4) spontaneous   Best Motor Response 6-->(M6) obeys commands   Best Verbal Response 5-->(V5) oriented   Glasgow Coma Scale Score 15   Pain   Pain Assessment Assessment   Pain Scale Used N   Pain Score (Numeric, Faces) 0   Pain/Comfort/Sleep   Sleep/Rest/Relaxation no problem identified;sleeping between care   [facility] Fall Risk Assessment Tool   Fall Risk Assessment Shift Assessment   Fall Risk- Implement fall risk interventions per protocol 0   Age 46   Fall History 0   Elimination, Bowel and Urine 0   Medications (Fall risk drugs: PCA/Opiates, Anti-Convulsants, Anti-Hypertensives, Diuretics, Hypnotics, Laxatives, Sedatives and Psychotropics) 5   Patient Care Equipment 1   Mobility (Yes on any of these rows will result in High Fall Risk)   Requires assistance or supervision for mobility, transfer, or ambulation 2   Unsteady Gait 0   Visual or auditory impairment affecting mobility 0   Cognition (Yes on any of these rows will result in High Fall Risk)   Altered awareness of immediate physical environment 0   Impulsive 0   Lack of understanding of one's physical and cognitive limitations 0   Johns Hopkins Score and Interventions   Johns Hopkins Score Total 8   Identified Fall Risk Level Moderate Risk (6-13 total points & absence of Yes in Mobility and Cognition Rows)   Safety   Safety Factors bed in low position;wheels locked;call light in reach;ID band on;fall risk slippers on;fall risk band on   ID Band/ Code Status (1st) Checked   Bed Type Speciality   What Specialty Bed is Being Used? ICU Stryker   Aspiration Risk Screen no risk factors   Airway Safety Measures  manual resuscitator/mask/valve in room;suction at bedside   Medical Device Protection tubing secured   All Alarms alarm(s) activated and audible   Hourly rounds completed Yes   Safety Precautions emergency equipment at bedside   Safety Promotion/Fall Prevention fall prevention program maintained;muscle strengthening facilitated;nonskid shoes/slippers when out of bed;safety round/check completed   Environmental Safety Modification assistive device/personal items within reach;clutter free environment maintained;lighting adjusted   Infection Prevention environmental surveillance performed;equipment surfaces disinfected;barrier precautions utilized;personal protective equipment utilized;promote handwashing;rest/sleep promoted;single patient room provided   Isolation Precautions contact II precautions maintained   Medication Review/Management medications reviewed;provider consulted;pharmacy consulted   Precautions   Precautions Fall;Skin;Isolation   HEENT   HEENT WDL ex;vision aid   Vision Aid glasses on   Mouth/Teeth WDL   Mouth/Teeth WDL WDL   Cardiac   Cardiac WDL WDL   Cardiac Rhythm apical pulse regular;radial pulse regular   Heart Sounds S1, S2   Telemetry Monitor On Yes   Telemetry Audible Yes   Telemetry Alarms Set Yes   Telemetry Box Number icu 1   ECG   Lead Monitored Lead II   Rhythm normal sinus rhythm  (80's)   Peripheral Neurovascular   Peripheral Neurovascular WDL WDL   VTE Prevention/Management anticoagulant therapy maintained   Ankle, Left Edema 1+ (Trace)   Ankle, Right Edema 1+ (Trace)   Foot, Left  Edema 1+ (Trace)   Foot, Right Edema 1+ (Trace)   Radial Pulse   Left Radial Pulse 2+ (normal);palpation   Right Radial Pulse 2+ (normal);palpation   Dorsalis Pedis Pulse   Left Dorsalis Pedis Pulse 2+ (normal);palpation   Right Dorsalis Pedis Pulse 2+ (normal);palpation   Respiratory   Respiratory WDL ex;rhythm/pattern   Dyspnea Occurrence With exertion   Rhythm/Pattern, Respiratory shortness of breath  reported   Expansion/Accessory Muscles/Retractions no use of accessory muscles   Breath Sounds   L General Breath Sounds Clear;Diminished   R General Breath Sounds Clear;Diminished       Respiratory Pre/Post-Treatment Assess   SpO2-1 100 %   Nutrition   Diet/Nutrition Received 2 gram sodium;other (see comments)   Specialty Diet/Nutrition Received renal diet   Diet/Feeding Assistance none   Diet/Feeding Tolerance poor;nausea   Junius CreamerJessica Burley K[place]ka, RN

## 2016-08-27 NOTE — Nurses Notes (Signed)
Patient c/o pain and mild nausea at this time, noted has PRN pain med but no nausea med order.  Dr. Charmian MuffBrewer alpha paged.  No vomiting noted.  Junius CreamerJessica Stacey Maura, RN

## 2016-08-27 NOTE — Nurses Notes (Addendum)
Pt dozing and leaning forward in recliner. Pt encouraged multiple times to transfer to bed to rest. Pt insisting to stay in chair, stating, "I'm ok, I'll catch myself before I'd fall, I can breath better this way." Bedside table place in front of pt in chair. Call bell in reach. Fall precautions in place. Pt instructed to call for assistance back to bed any time. Will monitor.

## 2016-08-27 NOTE — Nurses Notes (Signed)
08/27/16 1205   Cognitive   Cognitive/Neuro/Behavioral WDL WDL   Pupils   Pupil PERRLA yes   Pupil Size Left 3 mm   Pupil Shape Left round   Pupil Reaction Left brisk;equal   Pupil Accommodation Left normal response   Pupil Size Right 3 mm   Pupil Shape Right round   Pupil Reaction Right brisk;equal   Glasgow Coma Scale   Best Eye Response 4-->(E4) spontaneous   Best Motor Response 6-->(M6) obeys commands   Best Verbal Response 5-->(V5) oriented   Glasgow Coma Scale Score 15   Safety   Safety Factors bed in low position;wheels locked;call light in reach;ID band on;fall risk slippers on;fall risk band on   ID Band/ Code Status (1st) Checked   Bed Type Speciality   What Specialty Bed is Being Used? ICU [employer]   Aspiration Risk Screen no risk factors   Airway Safety Measures manual resuscitator/mask/valve in room;suction at bedside   Medical Device Protection tubing secured   All Alarms alarm(s) activated and audible   Hourly rounds completed Yes   Safety Precautions emergency equipment at bedside   Safety Promotion/Fall Prevention fall prevention program maintained;muscle strengthening facilitated;nonskid shoes/slippers when out of bed;safety round/check completed   Environmental Safety Modification assistive device/personal items within reach;clutter free environment maintained;lighting adjusted   Infection Prevention barrier precautions utilized;environmental surveillance performed;equipment surfaces disinfected;personal protective equipment utilized;promote handwashing;rest/sleep promoted;single patient room provided   Isolation Precautions contact II precautions maintained   Medication Review/Management medications reviewed;provider consulted   Precautions   Precautions Fall;Skin;Isolation   HEENT   HEENT WDL ex;vision aid   Vision Aid glasses on   Mouth/Teeth WDL   Mouth/Teeth WDL WDL   Cardiac   Cardiac WDL WDL   Cardiac Rhythm apical pulse regular;radial pulse regular   Heart Sounds S1, S2   Telemetry Monitor  On Yes   Telemetry Audible Yes   Telemetry Alarms Set Yes   Telemetry Box Number icu 1   ECG   Lead Monitored Lead II   Rhythm normal sinus rhythm  (80's)   Peripheral Neurovascular   Peripheral Neurovascular WDL WDL   VTE Prevention/Management patient refused intervention   Ankle, Left Edema 1+ (Trace)   Ankle, Right Edema 1+ (Trace)   Foot, Left Edema 1+ (Trace)   Foot, Right Edema 1+ (Trace)   Radial Pulse   Left Radial Pulse 2+ (normal);palpation   Right Radial Pulse 2+ (normal);palpation   Dorsalis Pedis Pulse   Left Dorsalis Pedis Pulse 2+ (normal);palpation   Right Dorsalis Pedis Pulse 2+ (normal);palpation   Respiratory   Respiratory WDL ex;rhythm/pattern   Dyspnea Occurrence With exertion   Rhythm/Pattern, Respiratory shortness of breath reported   Expansion/Accessory Muscles/Retractions no use of accessory muscles   Cough And Deep Breathing done independently per patient   Breath Sounds   L General Breath Sounds Clear;Diminished   R General Breath Sounds Clear;Diminished   Nutrition   Diet/Nutrition Received 2 gram sodium;other (see comments)   Specialty Diet/Nutrition Received renal diet   Diet/Feeding Tolerance poor;nausea;vomiting   Fluid Intake adequate   Gastrointestinal   GI WDL ex;appearance/characteristics;GI symptoms;nausea and vomiting   Abdominal Appearance distended;rounded   Abdominal Palpation All Quadrants   All Quadrants Abdominal Palpation firm;tender   Bowel Sounds All Quadrants   All Quadrants Bowel Sounds audible and normoactive   Last Bowel Movement 08/27/16   Stool Description Moderate;Loose  (per patient description)   Nausea/Vomiting   Nausea/Vomiting Signs/Symptoms nausea intermittent;containing undigested food;emesis   Genitourinary  Genitourinary WDL ex;voiding ability/characteristics   Voiding Characteristics anuria;patient on hemodialysis   Musculoskeletal   Musculoskeletal WDL ex   General Mobility generalized weakness;mildly impaired   Daily Care   Activity Management up in  chair   Activity Assistance Provided assistance, stand-by   Symptoms Noted During/After Activity shortness of breath  (mild)   Positioning   Body Position supine, head elevated;sitting   Head of Bed (HOB) HOB at 60 degrees   Positioning/Transfer Devices pillows;in use   Pressure Reduction Devices positioning supports utilized   Pressure Reduction Techniques frequent weight shift encouraged;weight shift assistance provided   Hygiene Care   Bathing/Skin Care antimicrobial wipes   Perineal Care absorbent pad changed   Hygiene Assistance per patient with minimal assistance   Daily Care Interventions   Self-Care Promotion independence encouraged;BADL personal objects within reach;BADL personal routines maintained   Skin   Skin WDL ex   Skin Integrity drain/device(s)  (scars)   Braden Risk Assessment   Sensory Perception 4-->no impairment   Moisture 4-->rarely moist   Activity 3-->walks occasionally   Mobility 3-->slightly limited   Nutrition 2-->probably inadequate   Friction and Shear 3-->no apparent problem   Braden Score 19   Skin Interventions   Skin Protection adhesive use limited;antimicrobial wipes;tubing/devices free from skin contact   Coping/Psychosocial   Observed Emotional State accepting;calm;uncooperative   Verbalized Emotional State acceptance   Plan Of Care Reviewed With patient   Junius Creamer, RN

## 2016-08-27 NOTE — Progress Notes (Signed)
IP PROGRESS NOTE      [name],Todd D  Date of Admission:  08/26/2016  Date of Birth:  10/14/1970  Date of Service:  08/27/2016    Chief Complaint: weakness, swelling  Subjective: admitted for weakness and swelling, missed last week dialysis due to visiting family and diarrhea    Vital Signs:  Temp (24hrs) Max:36.7 C (98 F)      Temperature: 36.7 C (98 F)  BP (Non-Invasive): (!) 152/90  MAP (Non-Invasive): 108 mmHG  Heart Rate: 77  Respiratory Rate: 19  Pain Score (Numeric, Faces): 10  SpO2-1: 100 %    Current Medications:    Current Facility-Administered Medications:  albuterol 90 mcg per inhalation oral inhaler - "Respiratory to administer" 2 Puff Inhalation Q4H PRN   amLODIPine (NORVASC) tablet 10 mg Oral Daily   carvedilol (COREG) tablet 25 mg Oral 2x/day-Food   darbepoetin alfa in polysorbate (ARANESP) 100 mcg/0.5 mL injection 100 mcg Intravenous Give in Dialysis   fentaNYL (SUBLIMAZE) 50 mcg/mL injection 50 mcg Intravenous Q2H PRN   heparin 5,000 unit/mL injection 5,000 Units Subcutaneous Q8HRS   hydrALAZINE (APRESOLINE) tablet 100 mg Oral 3x/day   iron sucrose (VENOFER) 100 mg/5 mL injection 100 mg Intravenous Give in Dialysis   labetalol (TRANDATE) 5 mg/mL injection 20 mg Intravenous Q1H PRN   levOCARNitine (CARNITOR) 200 mg/mL injection 20 mg/kg (Ideal) Intravenous Give in Dialysis   nicotine (NICODERM CQ) transdermal patch (mg/24 hr) 21 mg Transdermal Daily   nitroGLYCERIN (NITROSTAT) sublingual tablet 0.4 mg Sublingual Q5 Min PRN   pantoprazole (PROTONIX) delayed release tablet 40 mg Oral Daily   sevelamer (RENAGEL) tablet 800 mg Oral 3x/day-Meals   vancomycin 25 mg per mL oral liquid with cherry flavoring 250 mg Oral 4x/day       Today's Physical Exam:  General: appears in good health. No distress.   Eyes: Pupils equal and round, reactive to light and accomodation.   HENT:Head atraumatic and normocephalic   Neck: No JVD or thyromegaly or lymphadenopathy   Lungs: decreased bs b/l    Cardiovascular:  regular rate and rhythm, S1, S2 normal, no murmur,   Abdomen: distended  Extremities: +1 edema b/l   Skin: Skin warm and dry   Neurologic: Grossly normal   Psychiatric: Normal affect, behavior,         I/O:  I/O last 24 hours:    Intake/Output Summary (Last 24 hours) at 08/27/16 0906  Last data filed at 08/27/16 0400   Gross per 24 hour   Intake              440 ml   Output                0 ml   Net              440 ml     I/O current shift:         Labs  Please indicate ordered or reviewed)  Reviewed: I have reviewed all lab results.        Radiology Tests (Please indicate ordered or reviewed)  Reviewed: N/A      Problem List:  Active Hospital Problems   (*Primary Problem)    Diagnosis   . *Acute pulmonary edema (HCC)   . Volume overload   . Anemia of chronic renal failure, stage 5 (HCC)     Chronic   . ESRD (end stage renal disease) on dialysis (HCC)     Chronic   . Noncompliance with renal dialysis (  HCC)   . C. difficile diarrhea   . Accelerated hypertension       Assessment/ Plan:   Acute pulmonary edema  --secondary to volume overload from missed dialysis  --there is some concern with his chest pressure  --consult Nephrology  --monitor in the ICU  --supplemental oxygen    Accelerated hypertension  --resume prescribed meds per his home med list  --p.r.n.  Labetalol IV  --attempt control of blood pressure overnight  --urgent dialysis    End-stage renal disease on dialysis  --obvious noncompliance with renal dialysis  --as above    Abdominal pain, nausea and diarrhea  --patient with history of recent C diff diarrhea  --will check GI bio fire if patient has bowel movement  --patient's abdomen is distended and tender especially in the left lower quadrant  --have advised urgent CT scan of the abdomen to assess for bowel obstruction or ileus    Anemic of chronic renal failure  --H&H is 7.9 and 23.6  --patient relates that his typical Hgn is in the high 8s to low 9s  --may be dilutional due to volume overload   --monitor H&H  --stool for occult blood  --type and screen    DVT/PE Prophylaxis: Heparin  GI prophylaxis:  Ppi    Cc time 40 min

## 2016-08-27 NOTE — Progress Notes (Signed)
Consult Dictated:  Impressions:  1. ESRD of unknown etiology. On HD for 8 years, but recently fired from his nephrology group due to non-compliance.   2. No fixed address. Recently moved up to this area and may have moved in with sister, but he doesn't know address.   3. HTN  4. CHF  5. Anemia secondary to CKD, severe. May have iron deficiency as well.  6. Secondary hyperparathyroidism with hypocalcemia.   7. 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 Treated for pneumonia about a month ago. Takes PPIs chronically for unknown reason.   Recommendations:  1. Check HBSAg, TSAT, Ferritin  2. HD x 4 hours today, with Aranesp, venofer, carnitor, and calcitriol on HD  3. Agree with renal diet  4. Switched Renvela to Phoslo w/meals  5. D/C PPI - follow pharmacy protocol for C. Diff management.   6. Consult Care management for establishing outpatient HD enrollment and possibly PCP.

## 2016-08-27 NOTE — Nurses Notes (Signed)
Discussed elevated BP with HD RN, adjusting cuff and site. Junius CreamerJessica Mattisen Pohlmann, RN

## 2016-08-27 NOTE — Nurses Notes (Signed)
Assumed care of patient, at this time, from off-going nurse J. Wiles/RN.    Todd Hosley, RN

## 2016-08-27 NOTE — Nurses Notes (Signed)
Dr. Charmian MuffBrewer returned call concerning patient refusing CT scan, updated on continued refusals. Junius CreamerJessica Robbie Rideaux, RN

## 2016-08-27 NOTE — Nurses Notes (Signed)
08/27/16 0830       Respiratory Pre/Post-Treatment Assess   SpO2-1 100 %   Nutrition   Intake (%) other (see comments)  (10%)   Fluid Intake adequate   Gastrointestinal   GI WDL ex;appearance/characteristics;GI symptoms;nausea and vomiting   Abdominal Appearance distended;rounded   Abdominal Palpation All Quadrants   All Quadrants Abdominal Palpation firm;tender   Bowel Sounds All Quadrants   All Quadrants Bowel Sounds audible and normoactive   GI Signs/Symptoms abdominal fullness;abdominal pain;nausea   Nausea/Vomiting   Nausea/Vomiting Signs/Symptoms nausea intermittent   Nausea/Vomiting Interventions stimuli minimized;slow deep breathing encouraged   Nausea/Vomiting Outcome relief of signs/symptoms   Genitourinary   Genitourinary WDL ex;voiding ability/characteristics   Voiding Characteristics patient on hemodialysis;anuria   Musculoskeletal   Musculoskeletal WDL ex   General Mobility generalized weakness;mildly impaired   Daily Care   Activity Management up in chair;other (see comments)  (refuses to stay in bed)   Activity Assistance Provided assistance, 2 people   Positioning   Body Position sitting;lower extremity elevated, left;lower extremity elevated, right   Head of Bed (HOB) HOB at 60-90 degrees   Pressure Reduction Devices positioning supports utilized   Pressure Reduction Techniques weight shift assistance provided;frequent weight shift encouraged   Hygiene Care   Bathing/Skin Care back care;antimicrobial wipes;per care provider x1   Hygiene Assistance per patient with 1 assist   Daily Care Interventions   Self-Care Promotion independence encouraged;BADL personal objects within reach;BADL personal routines maintained   Skin   Skin WDL ex   Skin Integrity drain/device(s)  (scars)   Braden Risk Assessment   Sensory Perception 4-->no impairment   Moisture 4-->rarely moist   Activity 3-->walks occasionally   Mobility 3-->slightly limited   Nutrition 2-->probably inadequate   Friction and Shear  2-->potential problem   Braden Score 18   Skin Interventions   Skin Protection adhesive use limited;tubing/devices free from skin contact   Coping/Psychosocial   Observed Emotional State accepting;calm   Verbalized Emotional State acceptance   Plan Of Care Reviewed With patient   Psychosocial Support   Trust Relationship/Rapport care explained;choices provided;emotional support provided;empathic listening provided;questions answered;questions encouraged;reassurance provided;thoughts/feelings acknowledged   Diversional Activities television   Junius CreamerJessica Erasmus Bistline, RN

## 2016-08-27 NOTE — Consults (Signed)
PATIENT NAME: [name], SPILDE  HOSPITAL NUMBER:  [id number]  DATE OF SERVICE: 08/27/2016  DATE OF BIRTH:  1971-01-14    CONSULTATION    NEPHROLOGY CONSULTATION    REFERRING PHYSICIAN:  Tracey Harries, DO    REASON FOR CONSULTATION:  Consult was requested by Dr. Bing Quarry for dialysis services.    HISTORY OF PRESENT ILLNESS:  The patient is a 46 year old male with end-stage renal disease who has been on hemodialysis for approximately 8 years.  Apparently, the etiology is unknown.  He never had a kidney biopsy.  He has been under the care of a nephrology group in [place], [place], and receiving hemodialysis at a [facility] unit there.  He tells me that he was terminated from his nephrology practice about a week ago.  His last dialysis was a week ago today.  He then abruptly came to the [place] area apparently to move in with a sister.  After a few days the sister apparently told him that she was going to [place] for a while.  He then came to the emergency room because of increasing shortness of breath.  He was evaluated and admitted for further evaluation and treatment.  He also has had increasing diarrhea.  He was treated for a healthcare associated pneumonia about a month ago with antibiotics.  He has had multiple episodes of Clostridium difficile, and if Clostridium difficile is confirmed this time, he says this would be his 12th episode in the last 8 years.  He takes a proton pump inhibitor ever since he has been on dialysis for an unknown reason, and denies having ulcers, reflux, or Barrett's esophagus.    PAST MEDICAL HISTORY:  End-stage renal disease, hypertension, chronic obstructive pulmonary disease, congestive heart failure, noncompliance, anemia secondary to chronic kidney disease, secondary hyperparathyroidism, recurrent Clostridium difficile.    PAST SURGICAL HISTORY:  Three prior attempts at AV fistula have failed.  Currently he uses a the left-sided tunneled hemodialysis catheter for dialysis.     SOCIAL HISTORY:  He smokes cigarettes, unknown quantity and duration.  Currently no fixed address.    MEDICATIONS:  See EMR.    ALLERGIES:  See EMR.    REVIEW OF SYSTEMS:  Shortness of breath, abdominal pain and distention, recent diarrhea and vomiting.    PHYSICAL EXAMINATION:  On physical examination, he is a tired, sleepy male.  He falls asleep easily.    VITAL SIGNS:  Temperature 36.7 degrees Celsius, pulse 84, blood pressure 154/83, pulse ox 100%.  Weight 107 kilograms.  Slight myoclonus asterixis.   HEENT:  No signs of trauma.  No jaundice.  Mucous membranes moist.   LUNGS:  No inspiratory crackles or expiratory wheezes.   HEART:  No pericardial friction rub.  There is a tunneled hemodialysis catheter on the left side.    ABDOMEN:  Obese.  Positive bowel sounds.  No guarding, rebound, or masses.   EXTREMITIES:  2+ pitting edema of both ankles to the knees.    SKIN:  Warm and dry.    LABORATORY DATA:  White blood cells 6500, hemoglobin 8.0, platelets a 118,000.  Sodium 139, potassium 5.1, chloride 102, CO2 24, BUN 47, creatinine 7.80, glucose 106, calcium 7.0, albumin 3.4, phosphorus 4.6, magnesium 2.0.  B-type natriuretic peptide 4750.      Urinalysis, chest x-ray, CT scan of abdomen and pelvis are pending.    IMPRESSION:  1. End-stage renal disease of unknown etiology, on hemodialysis for 8 years.  Recently fired from his nephrology group due  to noncompliance, and has no fixed address.  2. Hypertension.  3. Congestive heart failure.  4. Anemia secondary to chronic kidney disease, severe, and may have iron deficiency as well.  5. Secondary hyperparathyroidism with hypocalcemia, but appropriate phosphorus.    6. Recurrent Clostridium difficile colitis.    RECOMMENDATIONS:  I have ordered hepatitis B surface antigen, transferrin saturation and ferritin.  I have ordered 4 hours of hemodialysis today with Aranesp, Venofer, Carnitor and calcitriol on hemodialysis.  Agree with renal diet.  I have switched his  phosphate binder to PhosLo with meals, which should improve his hypocalcemia.  I have discontinued his proton pump inhibitor.  Please follow pharmacy driven protocol for Clostridium difficile management.  I have consulted care management for establishing outpatient hemodialysis enrollment and possibly primary care provider in the area.  If discharged and established in a local outpatient dialysis unit.  I would be willing to continue serving as his nephrologist.      Thank you for consulting me on this patient.        Mendel CorningPaul G Clarice Zulauf, MD              CC:   Karolee OhsJeffrey Blake [facility] LLCDeBord, DO   [address]   [place], [place] [id number]       DD:  08/27/2016 10:31:07  DT:  08/27/2016 11:20:35 KM  D#:  [id number]

## 2016-08-27 NOTE — Nurses Notes (Signed)
Patient refused twice this AM to go to have CT of abdomen done, stated, "I feel too bad right now, I want to wait."  Explained to patient both times that results could be important to his care, patient verbalized understanding, continues to refuse. Dr. Charmian MuffBrewer alpha paged to update.  Todd CreamerJessica Jonathan Kirkendoll, RN

## 2016-08-27 NOTE — Nurses Notes (Signed)
Dr. Webb SilversmithWelch on unit, updated on calcium of 6.8, corrected to 7.2.  No new orders received.  Per Dr. Webb SilversmithWelch, HD is scheduled for  2:30PM today in ICU.  Junius CreamerJessica Indio Santilli, RN

## 2016-08-27 NOTE — Nurses Notes (Addendum)
Pt appeared to be sleeping. Entered room to inform of order for CT abdomen. Pt requesting not to go at this time, asking to wait until morning. Pt stated "This is going to have to wait, my breathing just isn't right". O2 sats upper 90s on room air. Pt placed on 2L via nasal cannula. Medicated for pain with PRN Fentanyl for 10/10 chest and abdominal pain. Pt returned to resting with eyes closed, appearing comfortable. Dr. Bing Quarryebord made aware by text page.

## 2016-08-27 NOTE — Nurses Notes (Signed)
Pt received to ICU1 from EMS crew. Pt able to sit on side of stretcher and transfer self to ICU bed. Continuous monitoring initiated. Pt A&Ox4, reporting pain 7/10 to left chest and abdomen. IV Fentanyl administered to pt by EMS crew en route. BP elevated 191/124 (143). Sats 90%s on room air. Pt belongings include glasses, bag of clothing,sneakers, and black book bag. Pt requesting to keep jeans on and have black book bag close by. Pt states he did not bring any  meds from home and has no valuables needing locked away. POC reviewed with pt. Fall precautions in place. Call bell in reach. Admission documentation and physical assessment to be complete.

## 2016-08-27 NOTE — Nurses Notes (Addendum)
Dr. Bing Quarryebord on unit to see pt. Discussed continued 9-10/10 c/o chest pain, as well as abdominal pain. Orders for IV Labetalol, Vancomycin, and CT abdomen. Orders entered by MD.

## 2016-08-28 ENCOUNTER — Inpatient Hospital Stay (HOSPITAL_BASED_OUTPATIENT_CLINIC_OR_DEPARTMENT_OTHER): Payer: Medicare Other

## 2016-08-28 DIAGNOSIS — R112 Nausea with vomiting, unspecified: Secondary | ICD-10-CM

## 2016-08-28 DIAGNOSIS — R140 Abdominal distension (gaseous): Secondary | ICD-10-CM

## 2016-08-28 DIAGNOSIS — A498 Other bacterial infections of unspecified site: Secondary | ICD-10-CM

## 2016-08-28 DIAGNOSIS — Z992 Dependence on renal dialysis: Secondary | ICD-10-CM

## 2016-08-28 DIAGNOSIS — N186 End stage renal disease: Secondary | ICD-10-CM

## 2016-08-28 DIAGNOSIS — A0471 Enterocolitis due to Clostridium difficile, recurrent: Secondary | ICD-10-CM

## 2016-08-28 DIAGNOSIS — I120 Hypertensive chronic kidney disease with stage 5 chronic kidney disease or end stage renal disease: Secondary | ICD-10-CM

## 2016-08-28 DIAGNOSIS — R188 Other ascites: Secondary | ICD-10-CM

## 2016-08-28 DIAGNOSIS — R1084 Generalized abdominal pain: Secondary | ICD-10-CM

## 2016-08-28 HISTORY — DX: Other bacterial infections of unspecified site: A49.8

## 2016-08-28 LAB — CBC W/AUTO DIFF
BASOPHIL #: 0 x10ˆ3/uL (ref 0.00–0.10)
BASOPHIL %: 1 % (ref 0–3)
EOSINOPHIL #: 0.2 x10ˆ3/uL (ref 0.00–0.50)
EOSINOPHIL %: 4 % (ref 0–5)
HCT: 24.2 % — ABNORMAL LOW (ref 40.0–50.0)
HGB: 8 g/dL — ABNORMAL LOW (ref 13.5–18.0)
LYMPHOCYTE #: 0.5 x10ˆ3/uL — ABNORMAL LOW (ref 1.00–4.80)
LYMPHOCYTE %: 11 % — ABNORMAL LOW (ref 15–43)
MCH: 31.9 pg (ref 27.5–33.2)
MCHC: 33.2 g/dL (ref 32.0–36.0)
MCV: 95.9 fL (ref 82.0–97.0)
MONOCYTE #: 0.4 x10ˆ3/uL (ref 0.20–0.90)
MONOCYTE %: 9 % (ref 5–12)
MPV: 7.8 fL (ref 7.4–10.5)
NEUTROPHIL #: 3.7 x10ˆ3/uL (ref 1.50–6.50)
NEUTROPHIL %: 75 % (ref 43–76)
PLATELETS: 111 x10ˆ3/uL — ABNORMAL LOW (ref 150–450)
RBC: 2.53 x10ˆ6/uL — ABNORMAL LOW (ref 4.40–5.80)
RDW: 19.3 % — ABNORMAL HIGH (ref 11.0–16.0)
WBC: 4.9 x10ˆ3/uL (ref 4.0–11.0)

## 2016-08-28 LAB — GI PANEL BY BIOFIRE FILM ARRAY
ROTAVIRUS A: NOT DETECTED
SALMONELLA SPECIES: NOT DETECTED

## 2016-08-28 LAB — GI PANEL BY BIOFIRE FILM ARRAY (BMC/BRX/JMC/JAX)
ADENOVIRUS F 40/41: NOT DETECTED
ASTROVIRUS: NOT DETECTED
CAMPYLOBACTER: NOT DETECTED
CLOSTRIDIUM DIFFICILE TOXIN A/B: DETECTED — AB
CRYPTOSPORIDIUM: NOT DETECTED
CYCLOSPORA CAYETANENSIS: NOT DETECTED
ENTAMOEBA HISTOLYTICA: NOT DETECTED
ENTEROAGGREGATIVE E. COLI (EAEC): NOT DETECTED
ENTEROPATHOGENIC E COLI (EPEC): NOT DETECTED
ENTEROTOXIGENIC E COLI (ETEC) LT/ST: NOT DETECTED
GIARDIA LAMBLIA: NOT DETECTED
NOROVIRUS GI/GII: NOT DETECTED
PLESIOMONAS SHIGELLOIDES: NOT DETECTED
ROTAVIRUS A: NOT DETECTED
SAPOVIRUS: NOT DETECTED
SHIGA-LIKE TOXIN-PRODUCING E COLI (STEC) STX1/STX2: NOT DETECTED
SHIGELLA/ENTEROINVASIVE E COLI (EIEC): NOT DETECTED
VIBRIO CHOLERAE: NOT DETECTED
VIBRIO: NOT DETECTED
YERSINIA ENTEROCOLITICA: NOT DETECTED

## 2016-08-28 LAB — BASIC METABOLIC PANEL
ANION GAP: 10 mmol/L (ref 3–11)
BUN/CREA RATIO: 5 — ABNORMAL LOW (ref 6–22)
BUN: 28 mg/dL — ABNORMAL HIGH (ref 6–20)
CALCIUM: 7.2 mg/dL — ABNORMAL LOW (ref 8.6–10.3)
CHLORIDE: 102 mmol/L (ref 101–111)
CO2 TOTAL: 25 mmol/L (ref 22–32)
CREATININE: 5.68 mg/dL — ABNORMAL HIGH (ref 0.61–1.24)
ESTIMATED GFR: 13 mL/min/1.73mˆ2 — ABNORMAL LOW (ref >60)
GLUCOSE: 101 mg/dL (ref 70–110)
GLUCOSE: 101 mg/dL (ref 70–110)
POTASSIUM: 4.3 mmol/L (ref 3.4–5.1)
SODIUM: 137 mmol/L (ref 136–145)

## 2016-08-28 LAB — AMYLASE: AMYLASE: 197 U/L — ABNORMAL HIGH (ref 5–100)

## 2016-08-28 LAB — C-REACTIVE PROTEIN(CRP),INFLAMMATION: CRP INFLAMMATION: 2.5 mg/L (ref <=10.0)

## 2016-08-28 LAB — LIPASE: LIPASE: 47 U/L (ref 22–51)

## 2016-08-28 MED ADMIN — THROMBIN 5,000 UNITS IN NS: ORAL | @ 22:00:00 | NDC 43825060641

## 2016-08-28 MED ADMIN — nicotine 21 mg/24 hr daily transdermal patch: TRANSDERMAL | @ 09:00:00

## 2016-08-28 MED ADMIN — albumin, human 5 % intravenous solution: SUBCUTANEOUS | @ 06:00:00 | NDC 00944049101

## 2016-08-28 NOTE — Nurses Notes (Signed)
No diet order  Hospitalist notified,   Pt now renal diet  NPO midnight for paracentesis tomorrow.

## 2016-08-28 NOTE — Consults (Signed)
PATIENT NAME: [name], [name]  HOSPITAL NUMBER:  [id number]  DATE OF SERVICE: 08/28/2016  DATE OF BIRTH:  1971-03-19    CONSULTATION    GASTROENTEROLOGY CONSULTATION:      REASON FOR CONSULTATION:  Heme positive stool.    HISTORY OF PRESENT ILLNESS:  The patient is a 46 year old gentleman with a history of chronic renal failure and a previous history of Clostridium difficile colitis back in February who was transferred here [facility].  When he was transferred, he was transferred mostly for shortness of breath.  He states he is now having nausea and vomiting but no hematemesis.  He has some mild pyrosis and no dysphagia.  He states he has lost at least 10 pounds over the last 3 months and has noted his belly to become more distended.  He states his belly at least for a number days has become painful.  He has no constipation or melena.  He states he sees maybe a small amount of blood with a bowel movement but he is having, he states, maybe up to 10 bowel movements a day which reminded him of his previous episode of Clostridium difficile.  He states that since his last treatment of Clostridium difficile, he has not been on any antibiotics.    PAST MEDICAL HISTORY:  Significant for kidney failure, hypertension, coronary stent placement, herniorrhaphy, and an AV fistula.    MEDICATIONS:  Prior to admission, he was on albuterol, amlodipine, Coreg, Lasix, hydralazine, pantoprazole and Renagel.    ALLERGIES:  He is allergic to LISINOPRIL, MORPHINE and TORADOL.    SOCIAL HISTORY:  He smokes a quarter pack of cigarettes daily.  Does not drink alcohol.      He states he had a colonoscopy which could have been up to 10 years ago, and he believes he may have had a polyp.    PRESENT MEDICATIONS:  Include albuterol, amlodipine, Coreg, fentanyl, labetalol, nicotine, nitroglycerin, Zofran and oral vancomycin.    PHYSICAL EXAMINATION:  GENERAL:  He is a pleasant gentleman.    VITAL SIGNS:  Blood pressure 117/86,  respirations 19, pulse 75, temperature 36.2.   HEENT:  Conjunctivae are mildly pale.  Sclerae nonicteric.    HEART:  Regular rhythm.   ABDOMEN:  Distended and diffusely tender with guarding.    IMAGING:  CT scan of the abdomen and pelvis which was done today showed moderate ascites and moderate body wall edema with atrophic kidneys.    LABORATORY DATA:  Laboratories reveal white count 4900, hemoglobin 8, hematocrit 24.2, platelet count 111,000, MCV 95.  Protime was 11.9 with an INR of 1.08.  Sodium 137, potassium 4.3, chloride 102, bicarbonate 25, BUN 28, creatinine 5.68, glucose 101.  Ferritin was 1450, iron 138, iron binding capacity 301, iron saturation 46%, transferrin 211. Total protein 6.2, albumin 3.5, bilirubin 0.7, AST 29, ALT 21, alkaline phosphatase 212.  Stool occult blood was reportedly positive.  GI panel by [facility] was positive for Clostridium difficile.    ASSESSMENT AND PLAN:  The patient is a 46 year old gentleman with diffuse abdominal tenderness with guarding, abdominal distention and ascites.  I think he should have a surgical consult.  Additionally, he probably will need a paracentesis to rule out possible bacterial peritonitis.  I would also check an amylase and lipase.  His liver function appears actually pretty good with a normal INR and a normal albumin.  There were no obvious signs of cirrhosis on his CT scan with no splenomegaly, although his platelet  count is decreased.  I think he needs a surgical consult and probable paracentesis to rule out possible bacterial peritonitis.  I think he should be nothing by mouth.  In light of his nausea and vomiting, I would also place him on pantoprazole.  He may need IV antibiotics but he does have Clostridium difficile.  I think he also should get an infectious disease consultation.     I thank Dr. Bing Quarry and Dr. Charmian Muff for letting me participate in the patient's care.        Anthoney Harada, MD              CC:   Donalee Citrin, DO   [address]   [place], [place] [address]     Eldridge Dace, MD   Fax: [phone]       DD:  08/28/2016 13:04:42  DT:  08/28/2016 13:43:14 MD  [name]#:  [id number]

## 2016-08-28 NOTE — Progress Notes (Signed)
Full consult dictated.  Patient with ascites diffuse abdominal tenderness with guarding.  Suggest surgical consult, paracentesis to r/o infection.  ID consult re recurrent C. Diff.  ty df             Reviewed CT with radiology- no megacolon noted.

## 2016-08-28 NOTE — Nurses Notes (Signed)
Consult completed for GI, Dr Kerin SalenFishkin will see later today. Todd OvermanHelen Melodi Happel, RN

## 2016-08-28 NOTE — Nurses Notes (Signed)
Dr. Bing Quarryebord notified,  by alpha-page, of positive C-Diff results.  Roswell MinersKevin Tahirah Sara, RN

## 2016-08-28 NOTE — Consults (Signed)
[facility]  Infectious Disease Consult      [name], [name], 46 y.o. 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 At this point  The options left for the patient include colectomy which is reserved for severe recalcitrant C difficile and fecal transplantation.    1. Recommend referral to facility with fecal transplantation capabilities- this is not urgent as patient is not decompensated  2. Restart Vancomycin 500mg  PO QID for two weeks  3. Follow up with his PCP outpatient for more care.    Information Obtained from: patient and history reviewed via medical record  Reason for Consult:  C difficile    HPI/Discussion:  [name] is a 46 y.o., Black/African American male who presents with a history of recurrent C difficile   Which according to the patient he has experienced no less than 12 times in the last year. He has been on Vancomycin at least  12 times he reports and taken Dificid at least six. He just finished a Vancomycin taper as well as a ten day course of Dificid.  He reports return of diarrhea no less than seven times a day, vomiting, nausea, and abdominal pain. 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 He reports that he is having weekly paracentesis at this point and is five days overdue.  He has a history of a liver biopsy which was negative for pathology. He denies fever, chills, nausea or vomiting currently.     Past Medical History:   Diagnosis Date   . Ascites    . Asthma    . Dialysis patient (HCC)    . HTN (hypertension)    . Kidney failure    . MI, old        Past Surgical History:   Procedure Laterality Date   . AV FISTULA PLACEMENT     .  HX CORONARY STENT PLACEMENT     . HX HERNIA REPAIR          Family History:     Family Medical History     None             Allergies   Allergen Reactions   . Lisinopril      Lip swelling     . Morphine      Throat swelling     . Toradol [Ketorolac]      Breathing issues   Medications Prior to Admission     Prescriptions    albuterol sulfate 90 mcg/Actuation Inhalation HFA Aerosol Inhaler oral inhaler    Take 1-2 Puffs by inhalation Every 6 hours as needed    amLODIPine (NORVASC) 10 mg Oral Tablet    Take 10 mg by mouth Once a day    carvedilol (COREG) 25 mg Oral Tablet    Take 25 mg by mouth Twice daily with food    furosemide (LASIX) 80 mg Oral Tablet    Take 80 mg by mouth Once a day    hydrALAZINE (APRESOLINE) 100 mg Oral Tablet    Take 100 mg by mouth Three times a day    pantoprazole (PROTONIX) 40 mg  Oral Tablet, Delayed Release (E.C.)    Take 40 mg by mouth Once a day    sevelamer (RENAGEL) 800 mg Oral Tablet    Take 800 mg by mouth Three times daily with meals        Current Facility-Administered Medications:  albuterol 90 mcg per inhalation oral inhaler - "Respiratory to administer" 2 Puff Inhalation Q4H PRN   amLODIPine (NORVASC) tablet 10 mg Oral Daily   calcium acetate (PHOSLO) capsule 1,334 mg Oral 3x/day-Meals   carvedilol (COREG) tablet 25 mg Oral 2x/day-Food   fentaNYL (SUBLIMAZE) 50 mcg/mL injection 50 mcg Intravenous Q2H PRN   hydrALAZINE (APRESOLINE) tablet 100 mg Oral 3x/day   labetalol (TRANDATE) 5 mg/mL injection 20 mg Intravenous Q1H PRN   nicotine (NICODERM CQ) transdermal patch (mg/24 hr) 21 mg Transdermal Daily   nitroGLYCERIN (NITROSTAT) sublingual tablet 0.4 mg Sublingual Q5 Min PRN   ondansetron (ZOFRAN) 2 mg/mL injection 4 mg Intravenous Q6H PRN   vancomycin 25 mg per mL oral liquid with cherry flavoring 250 mg Oral 4x/day       Social History     Social History   . Marital status: Single     Spouse name: N/A   . Number of children: N/A   . Years of education: N/A     Social History  Main Topics   . Smoking status: Current Every Day Smoker     Packs/day: 0.25     Types: Cigarettes   . Smokeless tobacco: Never Used   . Alcohol use No   . Drug use: No   . Sexual activity: Not on file     Other Topics Concern   . Not on file     Social History Narrative     Family Medical History     None            ROS:   Other than ROS in the HPI, all other systems were negative.    EXAM:  Blood pressure (!) 144/87, pulse 79, temperature 36.4 C (97.5 F), resp. rate 18, height 1.854 m (6\' 1" ), weight 105.7 kg (233 lb), SpO2 100 %.  Temp  Avg: 36.5 C (97.7 F)  Min: 36.2 C (97.1 F)  Max: 36.8 C (98.2 F)    General: appears chronically ill, moderately obese, appears stated age and no distress  Neuro: Grossly normal  Eyes: Inspection of the eye Conjunctival injection neither, Conjunctival purulence neither  Head:  Normocephalic. No masses, lesions, tenderness or abnormalities  ENT: ENT without erythema or injection.  Neck: Neck Stiffness  Yes  No adenopathy  Lungs: clear to auscultation and percussion bilaterally.   Cardiovascular:    Heart regular rate and rhythm, S1, S2 normal, no murmur, click, rub or gallop  Abdomen: bowel sounds: Hypoactive, palpation: Rigid and very distended, no masses, no hernias and no scars  Back: no tendernss  Genito-urinary: Deferred  Extremities: no cyanosis or edema and no synovitis or joint effusions  Musculo-skeletal No joint erythema or tenderness  Lymph: No cervical, supraclavicular or axillary lymphadenopathy noted.  Skin: No rashes and No lesions    Labs:    Lab Results for Last 24 Hours:    Results for orders placed or performed during the hospital encounter of 08/26/16 (from the past 24 hour(s))   OCCULT BLOOD, STOOL   Result Value Ref Range    OCCULT BLOOD Positive (A) Negative   GI PANEL BY BIOFIRE FILM ARRAY   Result Value Ref Range    CAMPYLOBACTER  Not Detected Not Detected    CLOSTRIDIUM DIFFICILE TOXIN A/B Detected (A) Not Detected    PLESIOMONAS SHIGELLOIDES Not  Detected Not Detected    SALMONELLA Not Detected Not Detected    VIBRIO Not Detected Not Detected    VIBRIO CHOLERAE Not Detected Not Detected    YERSINIA ENTEROCOLITICA Not Detected Not Detected    ENTEROAGGREGATIVE E. COLI (EAEC) Not Detected Not Detected    ENTEROPATHOGENIC E COLI (EPEC) Not Detected Not Detected, N/A    ENTEROTOXIGENIC E COLI (ETEC) LT/ST Not Detected Not Detected    SHIGA-LIKE TOXIN-PRODUCING E COLI (STEC) STX1/STX2 Not Detected Not Detected    SHIGELLA/ENTEROINVASIVE E COLI (EIEC) Not Detected Not Detected    CRYPTOSPORIDIUM Not Detected Not Detected    CYCLOSPORA CAYETANENSIS Not Detected Not Detected    ENTAMOEBA HISTOLYTICA Not Detected Not Detected    GIARDIA LAMBLIA Not Detected Not Detected    ADENOVIRUS F 40/41 Not Detected Not Detected    ASTROVIRUS Not Detected Not Detected    NOROVIRUS GI/GII Not Detected Not Detected    ROTAVIRUS A Not Detected Not Detected    SAPOVIRUS Not Detected Not Detected   CBC W/AUTO DIFF - [facility] ONLY   Result Value Ref Range    WBC 4.9 4.0 - 11.0 x10^3/uL    RBC 2.53 (L) 4.40 - 5.80 x10^6/uL    HGB 8.0 (L) 13.5 - 18.0 g/dL    HCT 62.124.2 (L) 30.840.0 - 50.0 %    MCV 95.9 82.0 - 97.0 fL    MCH 31.9 27.5 - 33.2 pg    MCHC 33.2 32.0 - 36.0 g/dL    RDW 65.719.3 (H) 84.611.0 - 16.0 %    PLATELETS 111 (L) 150 - 450 x10^3/uL    MPV 7.8 7.4 - 10.5 fL    NEUTROPHIL % 75 43 - 76 %    LYMPHOCYTE % 11 (L) 15 - 43 %    MONOCYTE % 9 5 - 12 %    EOSINOPHIL % 4 0 - 5 %    BASOPHIL % 1 0 - 3 %    NEUTROPHIL # 3.70 1.50 - 6.50 x10^3/uL    LYMPHOCYTE # 0.50 (L) 1.00 - 4.80 x10^3/uL    MONOCYTE # 0.40 0.20 - 0.90 x10^3/uL    EOSINOPHIL # 0.20 0.00 - 0.50 x10^3/uL    BASOPHIL # 0.00 0.00 - 0.10 x10^3/uL   BASIC METABOLIC PANEL   Result Value Ref Range    SODIUM 137 136 - 145 mmol/L    POTASSIUM 4.3 3.4 - 5.1 mmol/L    CHLORIDE 102 101 - 111 mmol/L    CO2 TOTAL 25 22 - 32 mmol/L    ANION GAP 10 3 - 11 mmol/L    CALCIUM 7.2 (L) 8.6 - 10.3 mg/dL    GLUCOSE 962101 70 - 952110 mg/dL    BUN 28 (H) 6 - 20  mg/dL    CREATININE 8.415.68 (H) 0.61 - 1.24 mg/dL    BUN/CREA RATIO 5 (L) 6 - 22    ESTIMATED GFR 13 (L) >60 mL/min/1.81104m^2   LIPASE   Result Value Ref Range    LIPASE 47 22 - 51 U/L   C-REACTIVE PROTEIN(CRP),INFLAMMATION   Result Value Ref Range    CRP INFLAMMATION 2.5 <=10.0 mg/L   AMYLASE   Result Value Ref Range    AMYLASE 197 (H) 5 - 100 U/L       Microbiology:     Hospital Encounter on 08/26/16 (from the past 96 hour(s))  -  GI PANEL BY BIOFIRE FILM ARRAY   Collection Time: 08/27/16 10:19 PM   Culture Result Status   CAMPYLOBACTER Not Detected Final   CLOSTRIDIUM DIFFICILE TOXIN A/B Detected (A) Final   PLESIOMONAS SHIGELLOIDES Not Detected Final   SALMONELLA Not Detected Final   VIBRIO Not Detected Final   VIBRIO CHOLERAE Not Detected Final   YERSINIA ENTEROCOLITICA Not Detected Final   ENTEROAGGREGATIVE E. COLI (EAEC) Not Detected Final   ENTEROPATHOGENIC E COLI (EPEC) Not Detected Final   ENTEROTOXIGENIC E COLI (ETEC) LT/ST Not Detected Final   SHIGA-LIKE TOXIN-PRODUCING E COLI (STEC) STX1/STX2 Not Detected Final   SHIGELLA/ENTEROINVASIVE E COLI (EIEC) Not Detected Final   CRYPTOSPORIDIUM Not Detected Final   CYCLOSPORA CAYETANENSIS Not Detected Final   ENTAMOEBA HISTOLYTICA Not Detected Final   GIARDIA LAMBLIA Not Detected Final   ADENOVIRUS F 40/41 Not Detected Final   ASTROVIRUS Not Detected Final   NOROVIRUS GI/GII Not Detected Final   ROTAVIRUS A Not Detected Final   SAPOVIRUS Not Detected Final          Imaging Studies:   CT ABDOMEN PELVIS WO IV CONTRAST                          Study Result     RADIOLOGIST: Viann Fish, MD    EXAMINATION: CT ABDOMEN PELVIS WO IV CONTRAST     EXAM DATE/TIME: 08/28/2016 9:39 AM    RADIATION DOSE: 1088.10 mGycm    CLINICAL INDICATION: Distended/ h/o C diff    TECHNIQUE: Axial plane images were performed. Coronal and Sagittal  reformatted image were obtained.    FINDINGS:    There is moderate generalized body wall edema.    The liver, spleen, and pancreas are  unremarkable. The kidneys are atrophic.  There is moderate ascites. There is no evidence for bowel obstruction.  There is no free air.    IMPRESSION:    Moderate ascites. Moderate generalized body wall edema.    Severely atrophic kidneys.         Fuller Song, MD

## 2016-08-28 NOTE — Nurses Notes (Addendum)
Shift Summary 0700 1330  0734 Neuro: C/O Abd and chest pain. Med: fentanyl 50 mcg sivp given PS=10  GI: Drinking lots of soda. No Nausea or vomiting.Abd large and firm, tender to touch. Pulm: Breath sounds clear but diminished. Ate only 50% of breakfast tray.  1013 Med: Fentanyl 50 mcg for abd & chest pain. PS=10 1030 Ct of abd done 1100 Pt has AM care when assist. is offered.  1245 GI: continues to drink po fluids, ate <50% of diet/ 1330 Dr Gust BroomsFiskin here to see/ will schedule for Paracentesis in IR in AM.  Dr Richarda Bladeomani ,Notified of consult by Dr Gust BroomsFiskin. 1356 MED: Fentanyl 50 mcg given for abd and chest pain PS=4. Just after injection. Pt awoke from nodding off and asked this nurse to call MD for stronger pain med.1415 Pt appears sleeping.  1426 Report called to Limited BrandsYoon RN on 5th med-surg. Transferred via wheelchair.  Pt states, [facility] in [name],(in July 2017).

## 2016-08-28 NOTE — Progress Notes (Signed)
Only stay on HD for 3.5 hours of 4 hour prescribed HD treatment yesterday evening. About 3.5 liters removed .  Care management working with trying to arrange outpatient HD.   Next scheduled HD is Monday.

## 2016-08-28 NOTE — Nurses Notes (Signed)
Pt transferred from ICU via wheel chair. Oriented to room and call bell system  VS obtained, will continue to monitor.

## 2016-08-28 NOTE — Nurses Notes (Signed)
ID consult info sent to Dr. Simmons via alpha page system

## 2016-08-28 NOTE — Progress Notes (Signed)
IP PROGRESS NOTE      [name], Fiorella D  Date of Admission:  08/26/2016  Date of Birth:  1970-06-02  Date of Service:  08/28/2016    Chief Complaint: abdominal pain, volume overload  Subjective: states still having abdominal pain, refused CT abdomen yesterday due to not feeling well, next HD session scheduled tomorrow, now CDiff positive stool and Heme positive stool    Vital Signs:  Temp (24hrs) Max:36.8 C (98.2 F)      Temperature: 36.7 C (98.1 F)  BP (Non-Invasive): 136/87  MAP (Non-Invasive): 100 mmHG  Heart Rate: 94  Respiratory Rate: 20  Pain Score (Numeric, Faces): 10  SpO2-1: 100 %    Current Medications:    Current Facility-Administered Medications:  albuterol 90 mcg per inhalation oral inhaler - "Respiratory to administer" 2 Puff Inhalation Q4H PRN   amLODIPine (NORVASC) tablet 10 mg Oral Daily   calcium acetate (PHOSLO) capsule 1,334 mg Oral 3x/day-Meals   carvedilol (COREG) tablet 25 mg Oral 2x/day-Food   fentaNYL (SUBLIMAZE) 50 mcg/mL injection 50 mcg Intravenous Q2H PRN   heparin 5,000 unit/mL injection 5,000 Units Subcutaneous Q8HRS   hydrALAZINE (APRESOLINE) tablet 100 mg Oral 3x/day   labetalol (TRANDATE) 5 mg/mL injection 20 mg Intravenous Q1H PRN   nicotine (NICODERM CQ) transdermal patch (mg/24 hr) 21 mg Transdermal Daily   nitroGLYCERIN (NITROSTAT) sublingual tablet 0.4 mg Sublingual Q5 Min PRN   ondansetron (ZOFRAN) 2 mg/mL injection 4 mg Intravenous Q6H PRN   vancomycin 25 mg per mL oral liquid with cherry flavoring 250 mg Oral 4x/day       Today's Physical Exam:  General: appears in good health. No distress.   Eyes: Pupils equal and round, reactive to light and accomodation.   HENT:Head atraumatic and normocephalic   Neck: No JVD or thyromegaly or lymphadenopathy   Lungs: CTAB, non labored breathing, no rales or wheezing.    Cardiovascular: regular rate and rhythm, S1, S2 normal, no murmur,   Abdomen: tender, distended   Extremities: extremities normal, atraumatic, no cyanosis or edema    Skin: Skin warm and dry   Neurologic: Grossly normal   Psychiatric: Normal affect, behavior,         I/O:  I/O last 24 hours:    Intake/Output Summary (Last 24 hours) at 08/28/16 0811  Last data filed at 08/27/16 1855   Gross per 24 hour   Intake               20 ml   Output             3480 ml   Net            -3460 ml     I/O current shift:         Labs  Please indicate ordered or reviewed)  Reviewed: I have reviewed all lab results.        Radiology Tests (Please indicate ordered or reviewed)  Reviewed: N/A      Problem List:  Active Hospital Problems   (*Primary Problem)    Diagnosis   . *Acute pulmonary edema (HCC)   . Volume overload   . Anemia of chronic renal failure, stage 5 (HCC)     Chronic   . ESRD (end stage renal disease) on dialysis (HCC)     Chronic   . Noncompliance with renal dialysis (HCC)   . C. difficile diarrhea   . Accelerated hypertension       Assessment/ Plan:   Acute pulmonary  edema  --secondary to volume overload from missed dialysis  --there is some concern with his chest pressure  --nephrology following, s/p dialysis    Accelerated hypertension  --resume prescribed meds per his home med list  --p.r.n. Labetalol IV  --improved    End-stage renal disease on dialysis  --obvious noncompliance with renal dialysis  --as above    Abdominal pain, nausea and diarrhea  --C diff positive, continue oral Vanc  --CT abdomen/pelvis pending    Anemic of chronic renal failure  --H&H is 7.9 and 23.6  --patient relates that his typical Hgnis in the high 8s to low 9s  --Heme positive stool, consulted GI    DVT/PE Prophylaxis: scd only due to heme positive stool  GI prophylaxis: Ppi

## 2016-08-28 NOTE — Nurses Notes (Signed)
Shift report provided by Murrell ConverseYoon, RN  with opportunity to ask questions and clarify issues.  Pt resting in bed watching T.V. Requesting something else to be ordered for pain. No signs of distress. NC on at 2L. Assuming care.

## 2016-08-29 DIAGNOSIS — A0471 Enterocolitis due to Clostridium difficile, recurrent: Secondary | ICD-10-CM

## 2016-08-29 LAB — BASIC METABOLIC PANEL
ANION GAP: 9 mmol/L (ref 3–11)
BUN/CREA RATIO: 5 — ABNORMAL LOW (ref 6–22)
BUN: 37 mg/dL — ABNORMAL HIGH (ref 6–20)
CALCIUM: 6.8 mg/dL — CL (ref 8.6–10.3)
CHLORIDE: 102 mmol/L (ref 101–111)
CO2 TOTAL: 26 mmol/L (ref 22–32)
CREATININE: 7.06 mg/dL — ABNORMAL HIGH (ref 0.61–1.24)
ESTIMATED GFR: 10 mL/min/1.73mˆ2 — ABNORMAL LOW (ref >60)
GLUCOSE: 89 mg/dL (ref 70–110)
POTASSIUM: 4.7 mmol/L (ref 3.4–5.1)
SODIUM: 137 mmol/L (ref 136–145)

## 2016-08-29 LAB — CBC W/AUTO DIFF
BASOPHIL #: 0.1 x10ˆ3/uL (ref 0.00–0.10)
BASOPHIL %: 1 % (ref 0–3)
EOSINOPHIL #: 0.2 x10ˆ3/uL (ref 0.00–0.50)
EOSINOPHIL %: 5 % (ref 0–5)
HCT: 24.4 % — ABNORMAL LOW (ref 40.0–50.0)
HGB: 8 g/dL — ABNORMAL LOW (ref 13.5–18.0)
LYMPHOCYTE #: 0.5 x10ˆ3/uL — ABNORMAL LOW (ref 1.00–4.80)
LYMPHOCYTE %: 10 % — ABNORMAL LOW (ref 15–43)
MCH: 31.3 pg (ref 27.5–33.2)
MCHC: 32.6 g/dL (ref 32.0–36.0)
MCV: 96.2 fL (ref 82.0–97.0)
MONOCYTE #: 0.5 x10ˆ3/uL (ref 0.20–0.90)
MONOCYTE %: 11 % (ref 5–12)
MONOCYTE %: 11 % (ref 5–12)
MPV: 7.7 fL (ref 7.4–10.5)
NEUTROPHIL #: 3.5 x10ˆ3/uL (ref 1.50–6.50)
NEUTROPHIL %: 73 % (ref 43–76)
PLATELETS: 113 x10ˆ3/uL — ABNORMAL LOW (ref 150–450)
RBC: 2.54 x10ˆ6/uL — ABNORMAL LOW (ref 4.40–5.80)
RDW: 19.4 % — ABNORMAL HIGH (ref 11.0–16.0)
WBC: 4.8 x10ˆ3/uL (ref 4.0–11.0)

## 2016-08-29 MED ORDER — VANCOMYCIN 25 MG/ML ORAL LIQUID WITH CHERRY FLAVORING
500.00 mg | Freq: Four times a day (QID) | ORAL | Status: DC
Start: 2016-08-29 — End: 2016-08-30
  Administered 2016-08-29 – 2016-08-30 (×5): 500 mg via ORAL
  Filled 2016-08-29 (×8): qty 20

## 2016-08-29 MED ORDER — METRONIDAZOLE 500 MG TABLET
500.00 mg | ORAL_TABLET | Freq: Three times a day (TID) | ORAL | Status: DC
Start: 2016-08-29 — End: 2016-08-30
  Administered 2016-08-29: 500 mg via ORAL
  Administered 2016-08-30: 0 mg via ORAL
  Filled 2016-08-29 (×3): qty 1

## 2016-08-29 MED ORDER — LEVOCARNITINE 200 MG/ML INTRAVENOUS SOLUTION
20.00 mg/kg | INTRAVENOUS | Status: AC
Start: 2016-08-30 — End: 2016-08-30
  Administered 2016-08-30 (×2): 1600 mg via INTRAVENOUS
  Filled 2016-08-29: qty 10

## 2016-08-29 MED ORDER — METRONIDAZOLE 250 MG IN NS 50 ML IVPB
250.0000 mg | INJECTION | Freq: Three times a day (TID) | Status: DC
Start: 2016-08-29 — End: 2016-08-29

## 2016-08-29 MED ORDER — HYDROMORPHONE 0.5 MG/0.5 ML INJECTION SYRINGE
0.30 mg | INJECTION | INTRAMUSCULAR | Status: DC | PRN
Start: 2016-08-29 — End: 2016-08-30
  Administered 2016-08-29 – 2016-08-30 (×7): 0.3 mg via INTRAVENOUS
  Filled 2016-08-29 (×2): qty 0.5
  Filled 2016-08-29 (×5): qty 1

## 2016-08-29 MED ORDER — DARBEPOETIN ALFA 60 MCG/0.3 ML IN POLYSORBATE INJECTION SYRINGE
60.00 ug | INJECTION | INTRAMUSCULAR | Status: AC
Start: 2016-08-30 — End: 2016-08-30
  Administered 2016-08-30: 60 ug via INTRAVENOUS
  Filled 2016-08-29: qty 0

## 2016-08-29 MED ADMIN — HYDROmorphone 0.5 mg/0.5 mL injection syringe: INTRAVENOUS | @ 21:00:00

## 2016-08-29 MED ADMIN — nystatin 100,000 unit/gram topical cream: INTRAVENOUS | @ 01:00:00 | NDC 51672128901

## 2016-08-29 NOTE — Progress Notes (Signed)
IP PROGRESS NOTE      [name],[name]  Date of Admission:  08/26/2016  Date of Birth:  Sep 11, 1970  Date of Service:  08/29/2016    Chief Complaint: swelling    Subjective: still c/o abdominal pain, CT shows ascites, paracentesis soon    Vital Signs:  Temp (24hrs) Max:36.7 C (98 F)      Temperature: 36.6 C (97.9 F)  BP (Non-Invasive): (!) 143/90  MAP (Non-Invasive): 107 mmHG  Heart Rate: 79  Respiratory Rate: 18  Pain Score (Numeric, Faces): 4  SpO2-1: 100 %    Current Medications:    Current Facility-Administered Medications:  albuterol 90 mcg per inhalation oral inhaler - "Respiratory to administer" 2 Puff Inhalation Q4H PRN   calcium acetate (PHOSLO) capsule 1,334 mg Oral 3x/day-Meals   carvedilol (COREG) tablet 25 mg Oral 2x/day-Food   [START ON 08/30/2016] darbepoetin alfa in polysorbate (ARANESP) 60 mcg/0.3 mL injection 60 mcg Intravenous Give in Dialysis   fentaNYL (SUBLIMAZE) 50 mcg/mL injection 50 mcg Intravenous Q2H PRN   hydrALAZINE (APRESOLINE) tablet 100 mg Oral 3x/day   labetalol (TRANDATE) 5 mg/mL injection 20 mg Intravenous Q1H PRN   [START ON 08/30/2016] levOCARNitine (CARNITOR) 200 mg/mL injection 20 mg/kg (Ideal) Intravenous Give in Dialysis   nicotine (NICODERM CQ) transdermal patch (mg/24 hr) 21 mg Transdermal Daily   nitroGLYCERIN (NITROSTAT) sublingual tablet 0.4 mg Sublingual Q5 Min PRN   ondansetron (ZOFRAN) 2 mg/mL injection 4 mg Intravenous Q6H PRN   vancomycin 25 mg per mL oral liquid with cherry flavoring 250 mg Oral 4x/day       Today's Physical Exam:  General: appears in good health. No distress.   Eyes: Pupils equal and round, reactive to light and accomodation.   HENT:Head atraumatic and normocephalic   Neck: No JVD or thyromegaly or lymphadenopathy   Lungs: CTAB, non labored breathing, no rales or wheezing.    Cardiovascular: regular rate and rhythm, S1, S2 normal, no murmur,   Abdomen: distended, ttp generalized, decreased bs x4  Extremities: extremities normal, atraumatic, no  cyanosis or edema   Skin: Skin warm and dry   Neurologic: Grossly normal   Psychiatric: Normal affect, behavior,         I/O:  I/O last 24 hours:    Intake/Output Summary (Last 24 hours) at 08/29/16 1102  Last data filed at 08/29/16 0000   Gross per 24 hour   Intake              650 ml   Output                0 ml   Net              650 ml     I/O current shift:         Labs  Please indicate ordered or reviewed)  Reviewed: I have reviewed all lab results.        Radiology Tests (Please indicate ordered or reviewed)  Reviewed: N/A      Problem List:  Active Hospital Problems   (*Primary Problem)    Diagnosis    *Acute pulmonary edema (HCC)    Volume overload    Anemia of chronic renal failure, stage 5 (HCC)     Chronic    ESRD (end stage renal disease) on dialysis (HCC)     Chronic    Noncompliance with renal dialysis (HCC)    C. difficile diarrhea    Accelerated hypertension  Assessment/ Plan:   Acute pulmonary edema  --secondary to volume overload from missed dialysis  --nephrology following, s/p dialysis    Accelerated hypertension  --resume prescribed meds per his home med list  --p.r.n. Labetalol IV  --improved, discontinued amlodipine due to edema    End-stage renal disease on dialysis  --obvious noncompliance with renal dialysis  --as above    Abdominal pain, nausea and diarrhea  --C diff positive, continue oral Vanc  --CT abdomen/pelvis shows ascites    Ascites  -paracentesis tomorrow per surgery team  -npo at midnight    Anemia of chronic renal failure  --H&H is 7.9 and 23.6  --patient relates that his typical Hgnis in the high 8s to low 9s  --Heme positive stool, consulted GI    DVT/PE Prophylaxis: scd only due to heme positive stool  GI prophylaxis: Ppi

## 2016-08-29 NOTE — Consults (Signed)
PATIENT NAME: Laqueta LindenMORGAN, Audra D  HOSPITAL NUMBER:  [id number]  DATE OF SERVICE: 08/29/2016  DATE OF BIRTH:  July 03, 1970    CONSULTATION    SURGICAL CONSULTATION:      CONSULTING PHYSICIAN:  Tracey HarriesJeffrey DeBord, DO    REASON FOR CONSULTATION:  Abdominal pain and Clostridium difficile.    HISTORY OF PRESENT ILLNESS:  The patient is an extremely pleasant, 46 year old African-American male with multiple comorbidities including hypertension and end-stage renal disease on dialysis transferred [facility] with a few days of abdominal distention, bloating, watery diarrhea, shortness of breath related to the abdominal distention as well as some chest pressure.  No fever, chills, hematochezia, vomiting, headache, cough, dysuria, hematuria.  A few episodes of vomiting until 2 or 3 days ago.  Visiting from the [place].  Skipped his hemodialysis treatment, which is Monday, Friday and Wednesday.  He claims he had over 18 episodes of Clostridium difficile colitis.  The last was just 1 month ago, and he feels that the issue maybe recurred.  He is on the list for a fecal transplant, but apparently his insurance and in [place] does not allow that.  His abdomen is distended.  His troponins are mildly elevated.  His BNP is 4700.  His abdomen is distended but not tense.  He has a little bit of pitting edema from the umbilicus down.  Since in hospital, has had 4 or 5 episodes of watery diarrhea.  He has crampy diffuse abdominal pain, but he feels better now.  No rebound, no guarding.  His most recent CT scan shows ascites, moderately generalized body edema, severely atrophic kidney and no visible gastrointestinal issue, at least not reported.      His labs are consistent with a white blood cell count of 4.9, hemoglobin and hematocrit of 8 and 24, platelet count 111,000.  INR is 1.  PT is 11.1, PTT is 29.9.  Sodium 142, potassium 5, chloride 104, carbon dioxide 25, BUN 49, creatinine 8, glucose 87, anion gap 13.  BUN and  creatinine ratio is 6.  GFR is 9, calcium 6.8, magnesium 2.1, phosphorus 5.5.  Ferritin 1450, iron 138, iron binding capacity 301, total protein 6.2, albumin 3.5, bilirubin total 0.7.  AST, ALT, and alkaline phosphatase are 29, 21 and 212.    PAST MEDICAL HISTORY:  Old myocardial infarction, kidney failure, hypertension, asthma, and ascites.    PAST SURGICAL HISTORY:  Hernia repair, stent placement and fistula placement.    FAMILY HISTORY:  No heart disease, kidney disease, liver disease, obesity, hypertension and other condition reported.    SOCIAL HISTORY:  He smokes.  Does not use any alcohol or drugs.    REVIEW OF SYSTEMS:  Constitutional:  Negative for excessive fatigue, exercise intolerance, fever, weakness, night sweats.  He is in general poor health.  Eyes:  Negative for infection, discharge, itching, excessive tearing or pain, spots floaters or glaucoma.  ENT:  Negative for sensitivity to noise, ear pain, ringing of the ears, vertigo, nosebleeds, sinusitis, postnasal drip.  Cardiovascular:  Negative for chest pain, palpitations, heart murmur, irregular pulse, hypertension, coolness or numbness of the extremities, color changes of fingers and toes.  Respiratory:  Negative for asthma, chronic cough, hemoptysis, breathing problem, sputum production.  Gastrointestinal:  Refer to history of present illness.  No hematemesis.  Some bloating.  No burning sensation of the esophagus.  Some nausea.  No vomiting at the moment.  No hemorrhoids.  No change in bowel habit, although he claimed that positive  changes in bowel habits due to the watery diarrhea.  Genitourinary:  Negative for painful urination, incontinence.  Positive for erectile dysfunction.  Musculoskeletal:  Negative for fracture, muscle cramping, twitching or pain, joint swelling, redness or pain.  Integumentary:  Negative for itching, scars, moles, sores, color change of lesion, change in nail color or texture.  Neurological:  Negative for syncope,  unconsciousness, seizure, memory loss, disorientation, speech or language dysfunction.  Psych:  Negative for anxiety, sleep disturbance, hallucination.  Endocrine:  Negative for adrenal problem, diabetes, unexplained change in height and weight, increased appetite.  Hematologic/lymphatic:  Negative for anemia, bleeding tendency, easy bruising, fatigue, low platelet count.  Allergic/ immunologic:  Negative for hay fever, hives, itching, sneezing, allergy to medication, chronic clear nasal drainage    ALLERGIES:  LISINOPRIL.    CODE:  Full.    PHYSICAL EXAMINATION:  VITAL SIGNS:  Temperature is 36.6 degrees Celsius, pulse 72, respirations 12, blood pressure 162/112, saturating 97%, BMI 30.    GENERAL:  He is awake, alert, oriented, in mild distress.   EYES:  Negative for jaundice.  No conjunctivitis.  Pupils are equally reactive to light and accommodation.  Extraocular movements are intact.   ENT:  Dry mucosal membranes.  Normal tympanic membranes.  Nose within normal limits.   NECK:  Supple.  No lymphadenopathy.  No thyroid enlargement,   COR:  Regular rate and rhythm.  No arrhythmia.  No muffled tone.   LUNGS:  Bilaterally clear to auscultation with no wheezing or rhonchi.   GASTROINTESTINAL:  Refer to history of present illness.  No pulsatile masses.  Positive for umbilical, negative for inguinal, femoral, lumbar area hernia.   GENITOURINARY:  Macroscopically normal.   MUSCULOSKELETAL:  No joint deformity at the level of the shoulder, elbow, wrist, hip, knee, ankle.  No soft tissue abnormality no bony deformity.   NEUROLOGICAL:  He moves all the extremities.   HEMATOLOGIC/LYMPHATIC:  No lymphadenopathy level at the level of the submandibular, axillary, supraclavicular, infraclavicular, femoral, lumbar area.    SKIN:  Negative for jaundice, dermatitis, suspicious nevi.   PSYCH:  Positive for anxiety.  Negative for depression.    ASSESSMENT:  Recurrent Clostridium difficile colitis.  Multiple episodes of Clostridium  difficile colitis in the past.    PLAN:  Agree with Flagyl and vancomycin.  Consider ID consult.  Re-culture the stool for PCR.  No acute surgical issue at the moment.  Diet -- may have clear liquid diet.  Careful evaluation of the volume status due to the patient's end-stage renal disease.  No signs of fluid overload at the moment, most likely secondary to the profuse diarrhea.  Follow up with you as required.        Lucia Estelle, MD              CC:   Karolee [facility]s [facility], DO   [address]   [place], [place] [address]       DD:  08/29/2016 60:45:40  DT:  08/29/2016 14:06:14 MD  D#:  [id number]

## 2016-08-29 NOTE — Nurses Notes (Signed)
Shift report provided by Yoon, RN with opportunity to ask questions and clarify issues. Pt resting in bed watching T.V. No signs of distress and no needs voiced. Assuming care.

## 2016-08-29 NOTE — Progress Notes (Signed)
[facility]  Surgery Progress Note      Laqueta LindenMorgan,Lyn D, 46 y.o. male  Date of Birth:  04/11/1971  Encounter Start Date:  08/26/2016  Inpatient Admission Date: 08/26/2016  Date of service: 08/29/2016        Chief Complaint: C Diff Colitis    Subjective: abdominal pain    Vital Signs:  Temp  Avg: 36.5 C (97.7 F)  Min: 36.4 C (97.5 F)  Max: 36.7 C (98 F)    Pulse  Avg: 84.4  Min: 79  Max: 88 BP  Min: 119/55  Max: 154/97   Resp  Avg: 18  Min: 18  Max: 18 SpO2  Avg: 98.4 %  Min: 97 %  Max: 100 %   Pain Score (Numeric, Faces): 4      Input/Output    Intake/Output Summary (Last 24 hours) at 08/29/16 1854  Last data filed at 08/29/16 0000   Gross per 24 hour   Intake              260 ml   Output                0 ml   Net              260 ml    I/O last shift:          Persistent abdominal pain  No guarding  Will add Flagyl po  Consider continue CLD            Lucia EstelleLivio Parthenia Tellefsen, MD

## 2016-08-29 NOTE — Progress Notes (Signed)
Please be aware that amlodipine and hydralazine is a very "edemagenic" combination. Consider switching to alternative agent(s) not associated with edema/fluid gain.   Next HD tomorrow.

## 2016-08-29 NOTE — Nurses Notes (Signed)
08/29/16 0500   Critical Results Reported    Date Notified 08/29/16   Time Notified 0520   LIP Notified of Results Yes   LIP Notified (Name) Dr.Debord   Date LIP Notified 08/29/16   Time LIP Notified 0520   Chemistry   Calcium Result 6.8

## 2016-08-29 NOTE — Care Plan (Signed)
Problem: Patient Care Overview (Adult,OB)  Goal: Plan of Care Review(Adult,OB)  The patient and/or their representative will communicate an understanding of their plan of care   Outcome: Ongoing (see interventions/notes)   08/29/16 1745   Coping/Psychosocial   Plan Of Care Reviewed With patient       Problem: Pain, Acute (Adult)  Goal: Acceptable Pain Control/Comfort Level  Patient will demonstrate the desired outcomes by discharge/transition of care.   Outcome: Ongoing (see interventions/notes)   08/29/16 1745   Pain, Acute (Adult)   Acceptable Pain Control/Comfort Level making progress toward outcome       Problem: Fall Risk (Adult)  Goal: Absence of Falls  Patient will demonstrate the desired outcomes by discharge/transition of care.   Outcome: Ongoing (see interventions/notes)   08/29/16 1745   Fall Risk (Adult)   Absence of Falls making progress toward outcome

## 2016-08-29 NOTE — Pharmacy (Signed)
[facility]              Antibiotic Stewardship    Laqueta LindenMorgan,Kahil D, 46 y.o. male  Date of Birth:  1971-03-24  Date of Admission:  08/26/2016  MRN# [id number]    Purpose:  Dose Optimization  Drug: metronidazole 500mg  po q8h  Therapy started: 08/29/2016  Length of therapy: < 24 hours    Temperature: 36.5 C (97.7 F)  Heart Rate: 88  BP (Non-Invasive): (!) 154/97  Respiratory Rate: 18    Lab Results   Component Value Date    WBC 4.8 08/29/2016    CREATININE 7.06 (H) 08/29/2016     Cultures: c.diff +    Suggestions: consider discontinuing oral metronidazole therapy. The guidelines do not suggest metronidazole use past mild-moderate disease and no suggestions for recurrent c.diff infection. Sometimes IV flagyl is used in the case of ileus or if pt is unable to take no meds. Consider discontinuation of metronidazole in this patient due to lack of evidence for it's use.    Please contact pharmacy with any questions.    Earlie ServerCarol Kariyah Baugh, PHARMD   08/29/2016, 19:03

## 2016-08-29 NOTE — Care Plan (Signed)
Problem: Patient Care Overview (Adult,OB)  Goal: Plan of Care Review(Adult,OB)  The patient and/or their representative will communicate an understanding of their plan of care   Outcome: Ongoing (see interventions/notes)   08/29/16 0203   Coping/Psychosocial   Plan Of Care Reviewed With patient       Problem: Pain, Acute (Adult)  Goal: Acceptable Pain Control/Comfort Level  Patient will demonstrate the desired outcomes by discharge/transition of care.   Outcome: Ongoing (see interventions/notes)   08/29/16 0203   Pain, Acute (Adult)   Acceptable Pain Control/Comfort Level making progress toward outcome       Problem: Fall Risk (Adult)  Goal: Absence of Falls  Patient will demonstrate the desired outcomes by discharge/transition of care.   Outcome: Ongoing (see interventions/notes)      Comments: Plan of care and medications reviewed with patient. Pt is independent to restroom. Hemodialysis pt , does not void. Abdomen is round, firm, and tender. PT NPO for paracentesis scheduled today for ascites. PRN pain medication administered for 10/10 pain in abdomen . Bowel sounds are audible and active in all 4 quadrants. Lungs are diminished bilaterally. O2 via NC on 2l . Fall risk precautions in place, call bell within reach.

## 2016-08-29 NOTE — Nurses Notes (Signed)
Upon entering pt 's room, pt wasn't either in the room or this unit @ 1220  notifiedd charge nurse, and called security  After 5 minutes later, this writer witnessed pt just got off the elevator and walking inito his room  Pt wasn't don any gown or gloves at that time  Pt stated that he was looking for a place to shopping  Pt was informed pt is not allowed to leave this unit unless pt wants to leave AMA  Pt also informed pt always has to wear gown and gloves when pt wants to walking the hall way  Pt verbalized understanding.

## 2016-08-30 ENCOUNTER — Other Ambulatory Visit (HOSPITAL_BASED_OUTPATIENT_CLINIC_OR_DEPARTMENT_OTHER): Payer: Self-pay

## 2016-08-30 ENCOUNTER — Encounter (HOSPITAL_BASED_OUTPATIENT_CLINIC_OR_DEPARTMENT_OTHER): Payer: Self-pay | Admitting: NURSE PRACTITIONER

## 2016-08-30 LAB — CBC W/AUTO DIFF
BASOPHIL #: 0 x10ˆ3/uL (ref 0.00–0.10)
BASOPHIL %: 1 % (ref 0–3)
BASOPHIL %: 1 % (ref 0–3)
EOSINOPHIL #: 0.2 x10ˆ3/uL (ref 0.00–0.50)
EOSINOPHIL %: 5 % (ref 0–5)
HCT: 22.8 % — ABNORMAL LOW (ref 40.0–50.0)
HGB: 7.5 g/dL — ABNORMAL LOW (ref 13.5–18.0)
LYMPHOCYTE #: 0.5 x10ˆ3/uL — ABNORMAL LOW (ref 1.00–4.80)
LYMPHOCYTE %: 10 % — ABNORMAL LOW (ref 15–43)
MCH: 31.5 pg (ref 27.5–33.2)
MCHC: 32.7 g/dL (ref 32.0–36.0)
MCV: 96.3 fL (ref 82.0–97.0)
MONOCYTE #: 0.4 x10ˆ3/uL (ref 0.20–0.90)
MONOCYTE %: 9 % (ref 5–12)
MPV: 8.1 fL (ref 7.4–10.5)
NEUTROPHIL #: 3.4 x10ˆ3/uL (ref 1.50–6.50)
NEUTROPHIL %: 76 % (ref 43–76)
PLATELETS: 110 x10ˆ3/uL — ABNORMAL LOW (ref 150–450)
RBC: 2.37 x10ˆ6/uL — ABNORMAL LOW (ref 4.40–5.80)
RDW: 18.8 % — ABNORMAL HIGH (ref 11.0–16.0)
WBC: 4.5 x10ˆ3/uL (ref 4.0–11.0)

## 2016-08-30 LAB — COMPREHENSIVE METABOLIC PROFILE - BMC/JMC ONLY
ALBUMIN/GLOBULIN RATIO: 1.4 (ref 0.8–2.0)
ALBUMIN: 3.4 g/dL — ABNORMAL LOW (ref 3.5–5.0)
ALKALINE PHOSPHATASE: 217 U/L — ABNORMAL HIGH (ref 38–126)
ALT (SGPT): 24 U/L (ref 17–63)
ANION GAP: 13 mmol/L — ABNORMAL HIGH (ref 3–11)
AST (SGOT): 24 U/L (ref 15–41)
BILIRUBIN TOTAL: 0.9 mg/dL (ref 0.3–1.2)
BUN/CREA RATIO: 6 (ref 6–22)
BUN/CREA RATIO: 6 (ref 6–22)
BUN: 50 mg/dL — ABNORMAL HIGH (ref 6–20)
CALCIUM: 6.8 mg/dL — CL (ref 8.6–10.3)
CHLORIDE: 104 mmol/L (ref 101–111)
CO2 TOTAL: 23 mmol/L (ref 22–32)
CREATININE: 8.93 mg/dL — ABNORMAL HIGH (ref 0.61–1.24)
ESTIMATED GFR: 8 mL/min/1.73mˆ2 — ABNORMAL LOW (ref >60)
GLUCOSE: 116 mg/dL — ABNORMAL HIGH (ref 70–110)
POTASSIUM: 5.2 mmol/L — ABNORMAL HIGH (ref 3.4–5.1)
PROTEIN TOTAL: 5.8 g/dL — ABNORMAL LOW (ref 6.4–8.3)
SODIUM: 140 mmol/L (ref 136–145)

## 2016-08-30 MED ORDER — LOSARTAN 25 MG TABLET
25.00 mg | ORAL_TABLET | Freq: Two times a day (BID) | ORAL | Status: DC
Start: 2016-08-30 — End: 2016-08-30
  Administered 2016-08-30: 25 mg via ORAL
  Filled 2016-08-30: qty 1

## 2016-08-30 MED ORDER — VANCOMYCIN 25 MG/ML ORAL LIQUID WITH CHERRY FLAVORING: 500 mg | mL | Freq: Four times a day (QID) | ORAL | 0 refills | 0 days | Status: AC

## 2016-08-30 MED ORDER — HYDRALAZINE 50 MG TABLET
50.00 mg | ORAL_TABLET | Freq: Three times a day (TID) | ORAL | Status: DC
Start: 2016-08-30 — End: 2016-08-30
  Administered 2016-08-30: 50 mg via ORAL
  Administered 2016-08-30: 0 mg via ORAL
  Filled 2016-08-30: qty 1

## 2016-08-30 MED ORDER — LOSARTAN 25 MG TABLET: 25 mg | Tab | Freq: Two times a day (BID) | ORAL | 0 refills | 0 days | Status: DC

## 2016-08-30 MED ORDER — OXYCODONE 5 MG TABLET: 5 mg | Tab | Freq: Four times a day (QID) | ORAL | 0 refills | 0 days | Status: DC | PRN

## 2016-08-30 MED ORDER — HEPARIN (PORCINE) 5,000 UNIT/ML INJECTION SOLUTION FOR DIALYSIS
4.4000 mL | INTRAMUSCULAR | Status: AC
Start: 2016-08-30 — End: 2016-08-30
  Administered 2016-08-30: 4.4 mL

## 2016-08-30 MED ADMIN — nystatin 100,000 unit/gram topical powder: INTRAVENOUS | @ 10:00:00 | NDC 00574200815

## 2016-08-30 MED ADMIN — vancomycin 1,000 mg intravenous injection: ORAL | @ 08:00:00

## 2016-08-30 MED ADMIN — sodium chloride 0.9 % (flush) injection syringe: INTRAVENOUS | @ 04:00:00

## 2016-08-30 MED ADMIN — albumin, human 5 % intravenous solution: ORAL | @ 14:00:00

## 2016-08-30 MED ADMIN — oxytocin 10 unit/mL injection solution: INTRAVENOUS | @ 14:00:00

## 2016-08-30 NOTE — Care Management Notes (Signed)
I received a phone call back from Tiffany from Dr Lonn GeorgiaKo office.  She states that Mr [name] has been discharged from their services.

## 2016-08-30 NOTE — Nurses Notes (Signed)
Patient discharged home with family.  AVS reviewed with patient/care giver.  A written copy of the AVS and discharge instructions was given to the patient/care giver.  Questions sufficiently answered as needed.  Patient/care giver encouraged to follow up with PCP as indicated.  In the event of an emergency, patient/care giver instructed to call 911 or go to the nearest emergency room.

## 2016-08-30 NOTE — Care Management Notes (Signed)
[name] is anxious to get out of the hospital.  He will be traveling back home to [place] this evening.

## 2016-08-30 NOTE — Nurses Notes (Signed)
Dialysis nurse requesting for me to give pt his blood pressure medicine for elevated BP's. Went to dialysis, pt refusing. Explained that his BP was elevated, pt verbalized understanding and stated "I don't take my BP medication until my treatment is over or I will bottom out" told pt that the monitor showed BP as 191/125. He said, "I'm fine, I'll take it when I'm done" Will notify MD.

## 2016-08-30 NOTE — Nurses Notes (Signed)
Order was placed this morning 5/14 for paracentesis. Attempted to look up patient to bring down for procedure and patient was discharged home this afternoon.

## 2016-08-30 NOTE — Nurses Notes (Signed)
08/30/16 1330   Post Dialysis Vitals   Weight (Post Dialysis) 105.3 kg (232 lb 2.3 oz)   Weight 108 kg (238 lb 1.6 oz)   Weight Source Bed   Temp 36.6 C (97.9 F)   Temp Source Oral   BP (!) 181/119   BP Source (Non-Invasive) C;RA   Pulse 88   RR 16   End Treatment Kt/V 1.25     Patient was dialyzed for 3.5 hrs (as patient demanded)  Using his left chest permacath.  HD machine was set-up as ordered: F180, 700dfr and 400 bfr maintained.  Carnitor 1600 mg ivp given at start of treatment and Aranesp 60 mcg ivp administered in dialysis.  UFGoal adjusted as patient tolerates.  Total net UF 3L and bvp 79.7.  Patients blood pressure on high side asymptomatic, primary RN offered BP meds as this Clinical research associatewriter suggested but patient totally refused.  Complain of bad cramps to abdominal area around 1145 and demanding with an attitude to end treatment. Assisted and stayed with patient as patient wants to sit at the edge of bed to relieve cramping.This Clinical research associatewriter has been reiterating importance of treatment whenever patient demanding things different from doctors order. Dr. Webb SilversmithWelch was made aware of the situation/patients behaviour.  Tolerated treatment. Awake as wheeled back to his room.

## 2016-08-30 NOTE — Nurses Notes (Signed)
NPO not needed for paracentesis, order to resume renal diet per Dr Ramiro HarvestParikh. Dietary notified for tray.

## 2016-08-30 NOTE — Care Management Notes (Signed)
Mr [name] wants to be discharged today.  [facility].  I have spoken to BurtonJamie in the control center.  She states Mr [name] needs to be in the front lobby @ 5 PM this afternoon to get [facility].  I have let nurse Boneta LucksJenny know this information.  I will also notify Dr Ramiro HarvestParikh.

## 2016-08-30 NOTE — Care Management Notes (Signed)
I called [facility] in [place] and spoke to [place].  She states that Dr Lonn GeorgiaKo will not return until Wednesday 09-01-16.  She will discuss Mr [name] and his return with Dr Lonn GeorgiaKo and call me back on Wednesday.

## 2016-08-30 NOTE — Discharge Summary (Signed)
[facility]  [place], [place] [id number]    DISCHARGE SUMMARY      PATIENT NAME:  [name],[name]  MRN:  [id number]  DOB:  1970/08/29    ADMISSION DATE:  08/26/2016  DISCHARGE DATE:  08/30/2016    ATTENDING PHYSICIAN: Rolly SalterParikh, Larina Lieurance, MD  PRIMARY CARE PHYSICIAN: No Pcp     ADMISSION DIAGNOSIS: Acute pulmonary edema (HCC)  DISCHARGE DIAGNOSIS:   Active Hospital Problems    Diagnosis Date Noted   . Principle Problem: Acute pulmonary edema (HCC) 08/26/2016   . Volume overload 08/26/2016   . Anemia of chronic renal failure, stage 5 (HCC) 08/26/2016   . ESRD (end stage renal disease) on dialysis (HCC) 08/26/2016   . Noncompliance with renal dialysis (HCC) 08/26/2016   . C. difficile diarrhea 08/26/2016   . Accelerated hypertension 08/26/2016      Resolved Hospital Problems    Diagnosis    No resolved problems to display.     There are no active non-hospital problems to display for this patient.     DISCHARGE MEDICATIONS:     Current Discharge Medication List      START taking these medications.       Details    losartan 25 mg Tablet   Commonly known as:  COZAAR    25 mg, Oral, Q12H   Qty:  60 Tab   Refills:  0       oxyCODONE 5 mg Tablet   Commonly known as:  ROXICODONE    5 mg, Oral, Q6H PRN   Qty:  15 Tab   Refills:  0       vancomycin 25 mg/mL Liquid   Commonly known as:  VANCOCIN    500 mg, Oral, 4x/day   Qty:  1440 mL   Refills:  0         CONTINUE these medications - NO CHANGES were made during your visit.       Details    albuterol sulfate 90 mcg/Actuation HFA Aerosol Inhaler oral inhaler    1-2 Puffs, Inhalation, Q6H PRN   Refills:  0       carvedilol 25 mg Tablet   Commonly known as:  COREG    25 mg, Oral, 2x/day-Food   Refills:  0       furosemide 80 mg Tablet   Commonly known as:  LASIX    80 mg, Oral, Daily   Refills:  0       hydrALAZINE 100 mg Tablet   Commonly known as:  APRESOLINE    100 mg, Oral, 3x/day   Refills:  0       RENAGEL 800 mg Tablet   Generic drug:  sevelamer    800 mg,  Oral, 3x/day-Meals   Refills:  0         STOP taking these medications.          amLODIPine 10 mg Tablet   Commonly known as:  NORVASC       pantoprazole 40 mg Tablet, Delayed Release (E.C.)   Commonly known as:  PROTONIX           DISCHARGE INSTRUCTIONS:     DISCHARGE INSTRUCTION - DIET   Diet: RESUME HOME DIET      DISCHARGE INSTRUCTION - ACTIVITY   Activity: GRADUALLY INCREASE ACTIVITY AS TOLERATED      ASPIRIN NOT ORDERED AT THIS TIME       REASON FOR HOSPITALIZATION AND HOSPITAL COURSE:  This  is a 46 y.o., male with history of end-stage renal disease on hemodialysis came to [place] with worsening shortness of breath.  He missed his dialysis outpatient.  He was admitted for acute pulmonary edema.  After recurrent dialysis session he has significantly improved.  Breathing is better.  He also complained of nausea and diarrhea.  He has recurrent C diff infection.  Currently on 500 mg vancomycin orally 4 times a day.  I have continued that.  He was traveling to [place].  He feels better and wants to go back to [facility] today.  Discharge home.  Discharge time spent 36 min.  New prescription of oral vancomycin given.  He may need fecal transplant.   He wants to go to [place], [place] for further care.  Norvasc discontinued and Cozaar started per Renal recommendation.  He will continue outpatient dialysis in [name] starting this Wednesday.  He has moderate ascites per CT scan.  He was offered paracentesis.  He declined.  He will do it in [place].        Physical exam :  GENERAL: The patient is alert and oriented to time, place and person.   HEENT: Normocephalic, anicteric. No pallor. PERRL.   NECK: Supple. No jugular venous distention.   LUNGS: clear breath sounds are audible bilaterally. Good bilateral air entry.  HEART: Both first and second sounds are audible, regular sinus rhythm. No murmur.  ABDOMEN: Soft, bowel sounds are present, nontender, no hepatosplenomegaly.   EXTREMITIES: No  edema. Peripheral pulses are present.   CENTRAL NERVOUS SYSTEM: No focal deficit, no cranial nerve palsy.   SKIN: No rashes or bruises.   MUSCULOSKELETAL SYSTEM: No deformity or swelling.   PSYCHIATRIC: No anxiety or depression.   HEMATOLOGIC: No ecchymosis, petechia or hematoma.      COURSE IN HOSPITAL: As above.  Please see Dr. Corinna Gab H&P for admission details.    CONDITION ON DISCHARGE: Alert, Oriented and VS Stable    DISCHARGE DISPOSITION:  Home discharge     cc: Primary Care Physician:  No Pcp  No address on file     GE:[id number] Physician:  Ameet Wilson Singer, DO  [address]  [place], [place] [address]

## 2016-08-30 NOTE — Progress Notes (Signed)
[facility]  Nephrology Consult Follow Up Note      Date of service: 08/30/2016  Hospital Day:  LOS: 4 days   Encounter Start Date: 08/26/2016  Inpatient Admission Date: 08/26/2016    SUBJECTIVE: Still c/o bloating and diarrhea.     OBJECTIVE:    Vital Signs:  Temp (24hrs) Max:36.6 C (97.9 F)      Temperature: 36.5 C (97.7 F)  BP (Non-Invasive): (!) 161/111  MAP (Non-Invasive): 133 mmHG  Heart Rate: 83  Respiratory Rate: 18  Pain Score (Numeric, Faces): 10  SpO2-1: 100 %    Systolic (24hrs), Avg:155 , Min:144 , Max:163     Diastolic (24hrs), Avg:96, Min:87, Max:111    MAP:  MAP (Non-Invasive)  Avg: 115.2 mmHG  Min: 76 mmHG  Max: 143 mmHG  Heart Rate:  Pulse  Avg: 88.6  Min: 72  Max: 106    Dialysis treatment total fluid removal:  Treatment Total Fluid Removal: 3480 mL  Post dialysis weight:  Weight (Post Dialysis): 103.6 kg (228 lb 6.3 oz)    Diet Order:  DIET RENAL (75GM PROT;2GM K: 2GM NA)  Nutrition:    Orders Placed This Encounter   No orders of the following type(s) were placed in this encounter: Nourishments.     IV Fluids, Meds, and Drips:      I/O:  I/O last 24 hours:    Intake/Output Summary (Last 24 hours) at 08/30/16 0914  Last data filed at 08/30/16 0200   Gross per 24 hour   Intake               20 ml   Output                0 ml   Net               20 ml     I/O current shift:     I/O last 3 completed shifts:  In: 20 [I.V.:20]  Out: -     Labs:  I have reviewed all lab results.    Imaging:  N/A    Current Medications:    Current Facility-Administered Medications:  albuterol 90 mcg per inhalation oral inhaler - "Respiratory to administer" 2 Puff Inhalation Q4H PRN   calcium acetate (PHOSLO) capsule 1,334 mg Oral 3x/day-Meals   carvedilol (COREG) tablet 25 mg Oral 2x/day-Food   darbepoetin alfa in polysorbate (ARANESP) 60 mcg/0.3 mL injection 60 mcg Intravenous Give in Dialysis   fentaNYL (SUBLIMAZE) 50 mcg/mL injection 50 mcg Intravenous Q2H PRN   heparin 5,000 unit/mL injection 4.4 mL  Intracatheter Give in Dialysis   hydrALAZINE (APRESOLINE) tablet 50 mg Oral 3x/day   HYDROmorphone (DILAUDID) 0.5 mg/0.5 mL injection 0.3 mg Intravenous Q4H PRN   labetalol (TRANDATE) 5 mg/mL injection 20 mg Intravenous Q1H PRN   levOCARNitine (CARNITOR) 200 mg/mL injection 20 mg/kg (Ideal) Intravenous Give in Dialysis   losartan (COZAAR) tablet 25 mg Oral Q12H   nicotine (NICODERM CQ) transdermal patch (mg/24 hr) 21 mg Transdermal Daily   nitroGLYCERIN (NITROSTAT) sublingual tablet 0.4 mg Sublingual Q5 Min PRN   ondansetron (ZOFRAN) 2 mg/mL injection 4 mg Intravenous Q6H PRN   vancomycin 25 mg per mL oral liquid with cherry flavoring 500 mg Oral 4x/day       Physical Exam:  Constitutional:  appears in good health  Respiratory:  decreased breath sounds bibasilar  Cardiovascular:  regular rate and rhythm  Gastrointestinal:  distended  Integumentary:  Skin warm and dry and  2+ edema    Impression/Recommendations:  1. ESRD - HD today, and encouraged him to stay on longer treatment today to maximize fluid removal for BP and edema/fluid control  2. Edema/Ascites/HTN. Agree with stopping amlodipine. I reduced hydralazine and added Losartan. Hopefully as we transition off of Amlodipine and Hydralazine, his edema and ascites should be easier to manage.   3. Anemia secondary to CKD. Aranesp and Carnitor on HD today.     Mendel CorningPaul G Kara Melching, MD

## 2016-08-30 NOTE — Other (Signed)
Patient Management Plan      Pt Name: David [name] DOB:1970/06/03      Management Plan by: Reather Converse, RN, Lewie Loron, PhD, RN, CS Elenor Quinones, RN and ED High Utilizers Team                                                                                           Date Placed:  08/30/16   Pt's Physicians:         Primary Care:  Phys Other, MD       Contact #: Unknown  Specialists:  Unknown        Contact #:             Summary    Reason for Referral: This patient has been provided a management plan for Chronic Pain     Patient with frequent visits for: CP, Abdominal pain (recurrent C.Diff), dialysis s/p non-compliance (Dialysis center in [name] no longer accepts patient).      Warning/Safety Alert:     Frequently leaves AMA when not given IV Narcotics and is easily agitated. Consider having security nearby    PMP:  Prescriptions: 4      Prescribers:  4     Pharmacies: 3  (medication preference is IV Dilaudid)   Pt scored a 38 on the VOA (Venebio Opioid Advisor).  This score indicates a 55.1% probability of OD or Resp. Dep. within the next 6 months       Situation: Chronic Conditions Summary ???   Root Cause Medical Problems List:  ESRD w/ dialysis, Cirrhosis, C. Diff (recurrent), MI, HTN, Non-compliance to tx (ie dialysis, completing meds for C. Diff)    Root Cause Psychosocial Problems List: SUD    Root Cause Social Determinants of Health (SDOH): On disability, has own transportation, moved from [name] to live with sister    Incidence of Testing: Over past 12 months: 43 XR, 35 CT, 0 MRI      Pattern of Access:  Over past 12 months:        29 ED visits outside [facility], [facility], and [facility], 15 Admissions       10 ED Visits @ [facility], 7 Admissions        23 ED Visits @ [facility], 3 Admissions         6 ED visits @ [facility], 6 Admissions    Pt has also been to [facility] in the past year. Unknown total ED visit/admissions. Noted admission in scanned media 09/2015 where he left AMA as well.      Sporadic times ??? Early am generally, several evening times.    Goals/Interventions:     ? ED Provider/nursing(together) to discuss mgmt. plan w/ pt  ? ED Provider to review PMP with pt  ? Nursing to obtain ordered UDS    ? Please review prior imaging; he is at high risk of overexposure    ? Nursing/CM obtain updated list of current providers and attempt to obtain ROI for coordination of care    ? Consider non-narcotic medications/alternatives to benzodiazepines unless emergently necessary.   ? Educate patient on the appropriate use  of ED. Provide UC and [facility] information    ? Pt needs access to and education on use of Narcan. Encourage pt to identify someone who can administer Narcan in the event pt becomes unresponsive.    ? Provide pt with High ED utilizer letter and [facility] Safe Opioid Prescribing Guidelines   ? Offer pt resource: ADDICTION RECOVERY SUPPORT WARM LINE  [phone]) 8 am - midnight 7 days/week  ? Consider Palliative consult    During Business Hours: CM   After Hours: BSMART(only if pt in crisis)    Challenges for Self Management:     ? Impairment of cognition and/or functional limitations: unknown  ? Financial Constraints: Unknown  ? Utilization: Extremely high ED visits and admissions!!!  ? Emotional status of Patient:   ? Behavioral Health Factors:  Easily agitated when not given IV narcotics  ? Motivational level: Likely lowTransportation:  Drives self or uses EMS    Medication Management: Provide education on dangers of over-sedation     Complications from poor adherence, Poor Adherence, Poly-pharm    Advanced Care Plan: Advanced directive on file                      ** This plan has been created by the Care Coordination Committee, a multi-disciplinary team. The patient and their physician were invited to participate in this plan. This management plan is intended to provide consistent evaluation and treatment for this patient not to supersede physician judgment.**

## 2016-09-12 ENCOUNTER — Emergency Department (HOSPITAL_BASED_OUTPATIENT_CLINIC_OR_DEPARTMENT_OTHER): Payer: Medicare Other

## 2016-09-12 ENCOUNTER — Observation Stay (HOSPITAL_BASED_OUTPATIENT_CLINIC_OR_DEPARTMENT_OTHER)
Admission: EM | Admit: 2016-09-12 | Discharge: 2016-09-14 | Discharge status: Against Medical Advice | Payer: Medicare Other | Attending: Internal Medicine | Admitting: Internal Medicine

## 2016-09-12 ENCOUNTER — Encounter (HOSPITAL_BASED_OUTPATIENT_CLINIC_OR_DEPARTMENT_OTHER): Payer: Self-pay

## 2016-09-12 DIAGNOSIS — I252 Old myocardial infarction: Secondary | ICD-10-CM | POA: Insufficient documentation

## 2016-09-12 DIAGNOSIS — R079 Chest pain, unspecified: Secondary | ICD-10-CM | POA: Diagnosis present

## 2016-09-12 DIAGNOSIS — Z91158 Patient's noncompliance with renal dialysis for other reason: Secondary | ICD-10-CM | POA: Diagnosis present

## 2016-09-12 DIAGNOSIS — R188 Other ascites: Secondary | ICD-10-CM | POA: Insufficient documentation

## 2016-09-12 DIAGNOSIS — Z9114 Patient's other noncompliance with medication regimen: Secondary | ICD-10-CM | POA: Insufficient documentation

## 2016-09-12 DIAGNOSIS — F17210 Nicotine dependence, cigarettes, uncomplicated: Secondary | ICD-10-CM | POA: Insufficient documentation

## 2016-09-12 DIAGNOSIS — E8770 Fluid overload, unspecified: Secondary | ICD-10-CM | POA: Diagnosis present

## 2016-09-12 DIAGNOSIS — R109 Unspecified abdominal pain: Secondary | ICD-10-CM

## 2016-09-12 DIAGNOSIS — A0472 Enterocolitis due to Clostridium difficile, not specified as recurrent: Secondary | ICD-10-CM | POA: Diagnosis present

## 2016-09-12 DIAGNOSIS — Z79899 Other long term (current) drug therapy: Secondary | ICD-10-CM | POA: Insufficient documentation

## 2016-09-12 DIAGNOSIS — Z7982 Long term (current) use of aspirin: Secondary | ICD-10-CM | POA: Insufficient documentation

## 2016-09-12 DIAGNOSIS — Z9115 Patient's noncompliance with renal dialysis: Secondary | ICD-10-CM | POA: Insufficient documentation

## 2016-09-12 DIAGNOSIS — D631 Anemia in chronic kidney disease: Secondary | ICD-10-CM | POA: Diagnosis present

## 2016-09-12 DIAGNOSIS — J45909 Unspecified asthma, uncomplicated: Secondary | ICD-10-CM | POA: Insufficient documentation

## 2016-09-12 DIAGNOSIS — F17200 Nicotine dependence, unspecified, uncomplicated: Secondary | ICD-10-CM | POA: Diagnosis present

## 2016-09-12 DIAGNOSIS — I120 Hypertensive chronic kidney disease with stage 5 chronic kidney disease or end stage renal disease: Secondary | ICD-10-CM | POA: Insufficient documentation

## 2016-09-12 DIAGNOSIS — E875 Hyperkalemia: Secondary | ICD-10-CM | POA: Insufficient documentation

## 2016-09-12 DIAGNOSIS — A0471 Enterocolitis due to Clostridium difficile, recurrent: Secondary | ICD-10-CM | POA: Insufficient documentation

## 2016-09-12 DIAGNOSIS — Z992 Dependence on renal dialysis: Secondary | ICD-10-CM | POA: Insufficient documentation

## 2016-09-12 DIAGNOSIS — N186 End stage renal disease: Secondary | ICD-10-CM | POA: Diagnosis present

## 2016-09-12 DIAGNOSIS — Z79891 Long term (current) use of opiate analgesic: Secondary | ICD-10-CM | POA: Insufficient documentation

## 2016-09-12 DIAGNOSIS — I10 Essential (primary) hypertension: Secondary | ICD-10-CM | POA: Diagnosis present

## 2016-09-12 DIAGNOSIS — N189 Chronic kidney disease, unspecified: Secondary | ICD-10-CM | POA: Diagnosis present

## 2016-09-12 DIAGNOSIS — N19 Unspecified kidney failure: Secondary | ICD-10-CM

## 2016-09-12 DIAGNOSIS — Z955 Presence of coronary angioplasty implant and graft: Secondary | ICD-10-CM | POA: Insufficient documentation

## 2016-09-12 LAB — CBC WITH DIFF
BASOPHIL #: 0 x10ˆ3/uL (ref 0.00–0.10)
BASOPHIL %: 1 % (ref 0–3)
EOSINOPHIL #: 0.4 x10ˆ3/uL (ref 0.00–0.50)
EOSINOPHIL %: 9 % — ABNORMAL HIGH (ref 0–5)
HCT: 24.4 % — ABNORMAL LOW (ref 40.0–50.0)
HGB: 8 g/dL — ABNORMAL LOW (ref 13.5–18.0)
LYMPHOCYTE #: 0.4 x10ˆ3/uL — ABNORMAL LOW (ref 1.00–4.80)
LYMPHOCYTE %: 10 % — ABNORMAL LOW (ref 15–43)
MCH: 31.5 pg (ref 27.5–33.2)
MCHC: 32.6 g/dL (ref 32.0–36.0)
MCV: 96.7 fL (ref 82.0–97.0)
MONOCYTE #: 0.3 x10ˆ3/uL (ref 0.20–0.90)
MONOCYTE %: 7 % (ref 5–12)
MPV: 7.4 fL (ref 7.4–10.5)
NEUTROPHIL #: 3.2 x10ˆ3/uL (ref 1.50–6.50)
NEUTROPHIL %: 74 % (ref 43–76)
PLATELETS: 107 x10ˆ3/uL — ABNORMAL LOW (ref 150–450)
RBC: 2.53 x10ˆ6/uL — ABNORMAL LOW (ref 4.40–5.80)
RDW: 19 % — ABNORMAL HIGH (ref 11.0–16.0)
WBC: 4.3 10*3/uL (ref 4.0–11.0)
WBC: 4.3 x10ˆ3/uL (ref 4.0–11.0)

## 2016-09-12 LAB — COMPREHENSIVE METABOLIC PROFILE - BMC/JMC ONLY
ALBUMIN/GLOBULIN RATIO: 1.5 (ref 0.8–2.0)
ALBUMIN: 3.4 g/dL — ABNORMAL LOW (ref 3.5–5.0)
ALKALINE PHOSPHATASE: 231 U/L — ABNORMAL HIGH (ref 38–126)
ALT (SGPT): 17 U/L (ref 17–63)
ANION GAP: 11 mmol/L (ref 3–11)
AST (SGOT): 26 U/L (ref 15–41)
BILIRUBIN TOTAL: 0.4 mg/dL (ref 0.3–1.2)
BUN/CREA RATIO: 4 — ABNORMAL LOW (ref 6–22)
BUN: 41 mg/dL — ABNORMAL HIGH (ref 6–20)
CALCIUM: 6.7 mg/dL — CL (ref 8.6–10.3)
CALCIUM: 6.7 mg/dL — CL (ref 8.6–10.3)
CHLORIDE: 105 mmol/L (ref 101–111)
CO2 TOTAL: 24 mmol/L (ref 22–32)
CREATININE: 9.75 mg/dL — ABNORMAL HIGH (ref 0.61–1.24)
ESTIMATED GFR: 7 mL/min/1.73mˆ2 — ABNORMAL LOW (ref >60)
GLUCOSE: 88 mg/dL (ref 70–110)
POTASSIUM: 5.2 mmol/L — ABNORMAL HIGH (ref 3.4–5.1)
PROTEIN TOTAL: 5.7 g/dL — ABNORMAL LOW (ref 6.4–8.3)
SODIUM: 140 mmol/L (ref 136–145)

## 2016-09-12 LAB — TROPONIN-I
TROPONIN I: 0.05 ng/mL (ref <=0.06)
TROPONIN I: 0.05 ng/mL (ref <=0.06)
TROPONIN I: 0.05 ng/mL (ref <=0.06)
TROPONIN I: 0.05 ng/mL (ref <=0.06)

## 2016-09-12 LAB — RED TOP TUBE

## 2016-09-12 LAB — BLUE TOP TUBE

## 2016-09-12 LAB — LIPASE: LIPASE: 35 U/L (ref 22–51)

## 2016-09-12 MED ORDER — ACETAMINOPHEN 325 MG TABLET
650.0000 mg | ORAL_TABLET | Freq: Four times a day (QID) | ORAL | Status: DC | PRN
Start: 2016-09-12 — End: 2016-09-14

## 2016-09-12 MED ORDER — FENTANYL (PF) 50 MCG/ML INJECTION SOLUTION
50.00 ug | INTRAMUSCULAR | Status: AC
Start: 2016-09-12 — End: 2016-09-12
  Administered 2016-09-12: 50 ug via INTRAVENOUS
  Filled 2016-09-12: qty 2

## 2016-09-12 MED ORDER — FUROSEMIDE 80 MG TABLET
80.0000 mg | ORAL_TABLET | Freq: Every day | ORAL | Status: DC
Start: 2016-09-12 — End: 2016-09-14
  Administered 2016-09-12 – 2016-09-14 (×3): 80 mg via ORAL
  Filled 2016-09-12 (×3): qty 1

## 2016-09-12 MED ORDER — HYDROMORPHONE 2 MG/ML INJECTION SOLUTION
1.00 mg | INTRAMUSCULAR | Status: DC | PRN
Start: 2016-09-12 — End: 2016-09-14
  Administered 2016-09-12 – 2016-09-14 (×8): 1 mg via INTRAVENOUS
  Filled 2016-09-12 (×8): qty 1

## 2016-09-12 MED ORDER — ALBUTEROL SULFATE HFA 90 MCG/ACTUATION AEROSOL INHALER
2.00 | INHALATION_SPRAY | Freq: Four times a day (QID) | RESPIRATORY_TRACT | Status: DC | PRN
Start: 2016-09-12 — End: 2016-09-13
  Filled 2016-09-12: qty 8

## 2016-09-12 MED ORDER — HYDRALAZINE 50 MG TABLET
100.00 mg | ORAL_TABLET | Freq: Three times a day (TID) | ORAL | Status: DC
Start: 2016-09-12 — End: 2016-09-14
  Administered 2016-09-12 (×2): 100 mg via ORAL
  Administered 2016-09-13: 0 mg via ORAL
  Administered 2016-09-13 – 2016-09-14 (×3): 100 mg via ORAL
  Filled 2016-09-12 (×6): qty 2

## 2016-09-12 MED ORDER — CALCIUM 500 MG (AS CARBONATE)-VITAMIN D3 5 MCG (200 UNIT) TABLET
1.0000 | ORAL_TABLET | Freq: Three times a day (TID) | ORAL | Status: DC
Start: 2016-09-12 — End: 2016-09-14
  Administered 2016-09-12 – 2016-09-13 (×5): 1 via ORAL
  Filled 2016-09-12 (×6): qty 1

## 2016-09-12 MED ORDER — NITROGLYCERIN 0.4 MG SUBLINGUAL TABLET
0.4000 mg | SUBLINGUAL_TABLET | SUBLINGUAL | Status: DC | PRN
Start: 2016-09-12 — End: 2016-09-14

## 2016-09-12 MED ORDER — SACCHAROMYCES BOULARDII 250 MG CAPSULE
250.0000 mg | ORAL_CAPSULE | Freq: Every day | ORAL | Status: DC
Start: 2016-09-12 — End: 2016-09-14
  Administered 2016-09-12 – 2016-09-14 (×3): 250 mg via ORAL
  Filled 2016-09-12 (×3): qty 1

## 2016-09-12 MED ORDER — SODIUM CHLORIDE 0.9 % (FLUSH) INJECTION SYRINGE
10.0000 mL | INJECTION | Freq: Three times a day (TID) | INTRAMUSCULAR | Status: DC
Start: 2016-09-12 — End: 2016-09-14
  Administered 2016-09-12 (×2): 10 mL via INTRAVENOUS
  Administered 2016-09-13: 0 mL via INTRAVENOUS
  Administered 2016-09-13: 10 mL via INTRAVENOUS
  Administered 2016-09-13 – 2016-09-14 (×2): 0 mL via INTRAVENOUS

## 2016-09-12 MED ORDER — ONDANSETRON HCL (PF) 4 MG/2 ML INJECTION SOLUTION
4.00 mg | INTRAMUSCULAR | Status: AC
Start: 2016-09-12 — End: 2016-09-12
  Administered 2016-09-12: 4 mg via INTRAVENOUS
  Filled 2016-09-12: qty 2

## 2016-09-12 MED ORDER — VANCOMYCIN 25 MG/ML ORAL LIQUID WITH CHERRY FLAVORING
500.00 mg | Freq: Four times a day (QID) | ORAL | Status: DC
Start: 2016-09-12 — End: 2016-09-14
  Administered 2016-09-12 – 2016-09-13 (×5): 500 mg via ORAL
  Filled 2016-09-12 (×12): qty 20

## 2016-09-12 MED ORDER — SODIUM CHLORIDE 0.9 % (FLUSH) INJECTION SYRINGE
10.0000 mL | INJECTION | INTRAMUSCULAR | Status: DC | PRN
Start: 2016-09-12 — End: 2016-09-14
  Administered 2016-09-13 – 2016-09-14 (×3): 10 mL via INTRAVENOUS

## 2016-09-12 MED ORDER — SEVELAMER HCL 800 MG TABLET
800.00 mg | ORAL_TABLET | Freq: Three times a day (TID) | ORAL | Status: DC
Start: 2016-09-12 — End: 2016-09-14
  Administered 2016-09-12 – 2016-09-13 (×4): 800 mg via ORAL
  Filled 2016-09-12 (×9): qty 1

## 2016-09-12 MED ORDER — PROMETHAZINE 25 MG/ML INJECTION SOLUTION
25.0000 mg | Freq: Four times a day (QID) | INTRAMUSCULAR | Status: DC | PRN
Start: 2016-09-12 — End: 2016-09-14
  Administered 2016-09-13: 25 mg via INTRAMUSCULAR
  Filled 2016-09-12: qty 1

## 2016-09-12 MED ORDER — ASPIRIN 81 MG TABLET,DELAYED RELEASE
81.0000 mg | DELAYED_RELEASE_TABLET | Freq: Every day | ORAL | Status: DC
Start: 2016-09-12 — End: 2016-09-14
  Administered 2016-09-12 – 2016-09-14 (×3): 81 mg via ORAL
  Filled 2016-09-12 (×3): qty 1

## 2016-09-12 MED ORDER — HEPARIN (PORCINE) 5,000 UNIT/ML INJECTION SOLUTION
5000.0000 [IU] | Freq: Three times a day (TID) | INTRAMUSCULAR | Status: DC
Start: 2016-09-12 — End: 2016-09-14
  Administered 2016-09-12 – 2016-09-14 (×6): 0 [IU] via SUBCUTANEOUS
  Filled 2016-09-12: qty 1

## 2016-09-12 MED ORDER — CARVEDILOL 12.5 MG TABLET
25.00 mg | ORAL_TABLET | Freq: Two times a day (BID) | ORAL | Status: DC
Start: 2016-09-12 — End: 2016-09-14
  Administered 2016-09-12 – 2016-09-13 (×2): 25 mg via ORAL
  Administered 2016-09-13: 0 mg via ORAL
  Administered 2016-09-14: 25 mg via ORAL
  Filled 2016-09-12 (×4): qty 2

## 2016-09-12 MED ORDER — HYDROMORPHONE 0.5 MG/0.5 ML INJECTION SYRINGE
0.3000 mg | INJECTION | INTRAMUSCULAR | Status: DC | PRN
Start: 2016-09-12 — End: 2016-09-12
  Administered 2016-09-12 (×2): 0.3 mg via INTRAVENOUS
  Filled 2016-09-12 (×2): qty 1

## 2016-09-12 MED ORDER — ACETAMINOPHEN 500 MG TABLET
1000.00 mg | ORAL_TABLET | ORAL | Status: DC
Start: 2016-09-12 — End: 2016-09-14
  Administered 2016-09-12: 0 mg via ORAL
  Filled 2016-09-12: qty 2

## 2016-09-12 MED ADMIN — fentaNYL (PF) 50 mcg/mL injection solution: INTRAVENOUS | @ 13:00:00

## 2016-09-12 MED ADMIN — vancomycin 1,000 mg intravenous injection: ORAL | @ 17:00:00

## 2016-09-12 MED ADMIN — sodium chloride 0.9 % (flush) injection syringe: INTRAVENOUS | @ 21:00:00

## 2016-09-12 MED ADMIN — calcium carbonate 500 mg (1,250 mg)-vitamin D3 200 unit tablet: ORAL | @ 22:00:00

## 2016-09-12 MED ADMIN — HYDROmorphone 0.5 mg/0.5 mL injection syringe: INTRAVENOUS | @ 21:00:00

## 2016-09-12 MED ADMIN — sodium chloride 0.9 % intravenous solution: ORAL | @ 14:00:00

## 2016-09-12 MED ADMIN — hydrocortisone 1 % topical cream: ORAL | @ 22:00:00 | NDC 45802043803

## 2016-09-12 NOTE — Nurses Notes (Signed)
Pt received to room 227 via stretcher from ED.

## 2016-09-12 NOTE — ED Nurses Note (Signed)
Report called to floor

## 2016-09-12 NOTE — ED Triage Notes (Addendum)
Pt reports c/o "chest pains, stomach pains, diarrhea, throwing up.  The chest pains started this morning.  The diarrhea and throwing up started on Thursday (09/09/16)."  Pt has recent hx of C-Diff infection (08/28/16).  Pt reports he has hemodialysis Mon-Wed-Fri however he missed his treatment Friday 09/10/16, last HD was Wednesday 09/08/16.

## 2016-09-12 NOTE — ED Provider Notes (Signed)
Todd Bills, MD  [facility]  Emergency Department Visit Note    Date:  09/12/2016  Primary care provider:  No Pcp  Means of arrival:  private car  History obtained from: patient  History limited by: none    Chief Complaint: Chest Pain and Abdominal Pain    HISTORY OF PRESENT ILLNESS     [name], date of birth Mar 28, 1971, is a 46 y.o. male who presents to the Emergency Department complaining of an acute onset of constant left-sided chest pain that began at 7:00 AM this morning. Patient explains that his pain radiates down the left arm and reports that he has an MI in the past (states that these symptoms feel similar to this). He currently rates his chest pain as a 10/10 in severity. The patient explains that in addition to this, he began experiencing GI symptoms on Wednesday (09/08/16) with nausea, vomiting, diarrhea, and diffuse abdominal pain. He provides that he has a recent history of C. Diff on 08/28/16 and states that his current GI symptoms feel similar to this. Patient also affirms fever of 101 F last night. Patient denies current fever, shortness of breath, cough, rhinorrhea, dizziness, and urinary symptoms. He reports that he receives hemodialysis on MWF, but states that he was unable to receive this on Friday 09/10/16 secondary to his GI symptoms (last hemodialysis on 09/08/16). Patient also notes that he has ascites and states that he had fluid removed for this last week.     Cardiac Risk Factors:  No prior history of coronary artery disease   No Diabetes   No Hyperlipidemia   + Hypertension  No family history of coronary artery disease or sudden cardiac death   + tobacco use   No drug use (methamphetamine, cocaine, or heroin)  No obesity  No sedentary lifestyle     REVIEW OF SYSTEMS     The pertinent positive and negative symptoms are as per HPI. All other systems reviewed and are negative.     PATIENT HISTORY     Past Medical History:  Past Medical History:   Diagnosis Date   .  Ascites    . Asthma    . Clostridium difficile infection 08/28/2016    +  C Diff 08/2016   . Dialysis patient (HCC)    . HTN (hypertension)    . Kidney failure    . MI, old        Past Surgical History:  Past Surgical History:   Procedure Laterality Date   . Av fistula placement     . Hx coronary stent placement     . Hx hernia repair         Family History:  No history of acute family illness given at this time.     Social History:  Social History   Substance Use Topics   . Smoking status: Current Every Day Smoker     Packs/day: 0.25     Types: Cigarettes   . Smokeless tobacco: Never Used   . Alcohol use No     History   Drug Use No       Medications:  Current Outpatient Prescriptions   Medication Sig   . albuterol sulfate 90 mcg/Actuation Inhalation HFA Aerosol Inhaler oral inhaler Take 1-2 Puffs by inhalation Every 6 hours as needed   . carvedilol (COREG) 25 mg Oral Tablet Take 25 mg by mouth Twice daily with food   . furosemide (LASIX) 80 mg Oral  Tablet Take 80 mg by mouth Once a day   . hydrALAZINE (APRESOLINE) 100 mg Oral Tablet Take 100 mg by mouth Three times a day   . sevelamer (RENAGEL) 800 mg Oral Tablet Take 800 mg by mouth Three times daily with meals   . vancomycin (VANCOCIN) 25 mg/mL Oral Liquid Take 20 mL (500 mg total) by mouth Four times a day for 18 days       Allergies:  Allergies   Allergen Reactions   . Lisinopril      Lip swelling     . Morphine      Throat swelling     . Toradol [Ketorolac]      Breathing issues       PHYSICAL EXAM     Vitals:  Filed Vitals:    09/12/16 1232   BP: (!) 165/97   Pulse: (!) 101   Resp: (!) 24   Temp: 36.9 C (98.5 F)   SpO2: 97%       Pulse ox  97% on None (Room Air) interpreted by me as: Normal    Constitutional: Appears well-developed in no acute distress.  HENT: No facial injuries or swelling.  Head: Atraumatic.   Nose: No rhinorrhea or epistaxis.  Mouth/Throat: Lips and mucosa well hydrated.   Eyes: Extra Ocular Movements are normal. Pupils are equal,  round, and reactive to light.   Neck: No thyroid enlargement.  Cardiovascular: Tachycardic rate and regular rhythm.    Pulmonary/Chest: Clear to auscultation. No accessory muscle usage. No respiratory distress.   Abdominal: Bowel sounds are positive. Slightly distended with tenderness throughout. No rebound or guarding.   Extremities: Normal range of motion, no edema. No deformity.  Neurological: Alert with sensory motor intact.  Skin: No rash noted. No cyanosis. No discoloration.  Psychiatric: Normal mood and affect. Behavior is normal.       DIAGNOSTIC STUDIES     Labs:    Results for orders placed or performed during the hospital encounter of 09/12/16   COMPREHENSIVE METABOLIC PROFILE - [facility] ONLY   Result Value Ref Range    SODIUM 140 136 - 145 mmol/L    POTASSIUM 5.2 (H) 3.4 - 5.1 mmol/L    CHLORIDE 105 101 - 111 mmol/L    CO2 TOTAL 24 22 - 32 mmol/L    ANION GAP 11 3 - 11 mmol/L    BUN 41 (H) 6 - 20 mg/dL    CREATININE 1.61 (H) 0.61 - 1.24 mg/dL    BUN/CREA RATIO 4 (L) 6 - 22    ESTIMATED GFR 7 (L) >60 mL/min/1.38m^2    ALBUMIN 3.4 (L) 3.5 - 5.0 g/dL    CALCIUM 6.7 (LL) 8.6 - 10.3 mg/dL    GLUCOSE 88 70 - 096 mg/dL    ALKALINE PHOSPHATASE 231 (H) 38 - 126 U/L    ALT (SGPT) 17 17 - 63 U/L    AST (SGOT) 26 15 - 41 U/L    BILIRUBIN TOTAL 0.4 0.3 - 1.2 mg/dL    PROTEIN TOTAL 5.7 (L) 6.4 - 8.3 g/dL    ALBUMIN/GLOBULIN RATIO 1.5 0.8 - 2.0   TROPONIN-I   Result Value Ref Range    TROPONIN I 0.05 <=0.06 ng/mL   Lipase   Result Value Ref Range    LIPASE 35 22 - 51 U/L   CBC WITH DIFF   Result Value Ref Range    WBC 4.3 4.0 - 11.0 x10^3/uL    RBC 2.53 (L) 4.40 - 5.80  x10^6/uL    HGB 8.0 (L) 13.5 - 18.0 g/dL    HCT 16.124.4 (L) 09.640.0 - 50.0 %    MCV 96.7 82.0 - 97.0 fL    MCH 31.5 27.5 - 33.2 pg    MCHC 32.6 32.0 - 36.0 g/dL    RDW 04.519.0 (H) 40.911.0 - 16.0 %    PLATELETS 107 (L) 150 - 450 x10^3/uL    MPV 7.4 7.4 - 10.5 fL    NEUTROPHIL % 74 43 - 76 %    LYMPHOCYTE % 10 (L) 15 - 43 %    MONOCYTE % 7 5 - 12 %    EOSINOPHIL % 9  (H) 0 - 5 %    BASOPHIL % 1 0 - 3 %    NEUTROPHIL # 3.20 1.50 - 6.50 x10^3/uL    LYMPHOCYTE # 0.40 (L) 1.00 - 4.80 x10^3/uL    MONOCYTE # 0.30 0.20 - 0.90 x10^3/uL    EOSINOPHIL # 0.40 0.00 - 0.50 x10^3/uL    BASOPHIL # 0.00 0.00 - 0.10 x10^3/uL     Labs reviewed and interpreted by me.    Radiology:    CT ABDOMEN PELVIS WO IV CONTRAST   Final Result   1. Large volume ascites    2. Atrophic kidneys   3. Additional comment: No inflammatory changes or mass lesions.      MIPS PERFORMANCE METRICS:   1. Dose reduction technique used. Automatic exposure control (AEC) was   applied.   2. Count of high-dose radiation studies (CT and cardiac and NM) last 12   months: 2.   3. DLP: 1482.90 mGy.cm         XR CHEST AP PORTABLE (If patient condition warrants)   Final Result   Normal appearance of lungs. No acute radiographic abnormality.           Radiological imaging interpreted by radiologist and independently reviewed by me.    EKG:  12 lead EKG interpreted by me shows sinus tachycardia, rate of 102 bpm, normal axis, normal intervals, no ST elevations, no Q waves.     ED PROGRESS NOTE / MEDICAL DECISION MAKING     Old records reviewed by me:  I have reviewed the nurse's notes. I have reviewed the patient's problem list.     Orders Placed This Encounter   . C. DIFFICILE PCR TESTING - [facility] ONLY   . XR CHEST AP PORTABLE (If patient condition warrants)   . CT ABDOMEN PELVIS WO IV CONTRAST   . COMPREHENSIVE METABOLIC PROFILE - [facility] ONLY   . TROPONIN-I   . Lipase   . URINALYSIS WITH MICROSCOPIC REFLEX IF INDICATED [facility] ONLY   . CBC WITH DIFF   . ECG 12-LEAD (Take to provider with a brief history)   . INSERT & MAINTAIN PERIPHERAL IV ACCESS   . ondansetron (ZOFRAN) 2 mg/mL injection   . acetaminophen (TYLENOL) tablet   . fentaNYL (SUBLIMAZE) 50 mcg/mL injection     C. Difficile PCR pending at the time of disposition.    XR chest AP, CBC, CMP, Troponin, and EKG were initially ordered.    12:44 PM: Initial evaluation is  complete at this time. Patient has multiple complaints will evaluate his chest pain from the standpoint of cardiac causes. He may have ascites with his tenderness and reported fever. Might need to consider a paracentesis in the inpatient basis. I discussed with the patient that I would order C. difficile PCR, CT abdomen pelvis without IV contrast, Lipase,  and UA to further evaluate. I will treat the patient with IV Zofran. I will reevaluate. Patient is agreeable with the treatment plan at this time.    1:19 PM: Per RN, patient is requesting medication for pain. Patient has drug sensitivities that preclude me from treating him with non-narcotic medications. I have serious doubts about the patient's complaints being cardiac in nature. It is interesting that his complaints from 08/26/16 are almost identical to his presenting complaints today. Will treat the patient with Tylenol PO, and will treat the patient with Fentanyl if his pain persists.     1:36 PM: Per RN, patient is refusing Tylenol PO and states that he took this at home without relief. Will treat the patient with IV Fentanyl. Patient is agreeable.     1:38 PM: There is no IR available today to consider paracentesis.     1:50 PM: On recheck, I had a long talk with the patient regarding his workup. It appears he is going to need paracentesis and dialysis, but I am doubtful he will have either of these done before Tuesday (09/14/16) whether he is admitted or not. Patient understands this, but feels that he has exertional dyspnea to where he can not be managed outpatient.     1:59 PM: Hospitalist paged.    2:13 PM: I discussed the patient's case and above findings at length with Dr. R[facility]ro [facility]) who will accept the patient for admission.     Pre-Disposition Vitals:  Filed Vitals:    09/12/16 1232 09/12/16 1245 09/12/16 1300 09/12/16 1330   BP: (!) 165/97 (!) 179/123 (!) 160/108 (!) 171/107   Pulse: (!) 101 (!) 101 (!) 101 97   Resp: (!) 24 18 14 15    Temp:  36.9 C (98.5 F)      SpO2: 97% 98% 99% 100%       CLINICAL IMPRESSION     Encounter Diagnoses   Name Primary?   . Chest pain, unspecified type Yes   . Abdominal pain, unspecified abdominal location    . Renal failure    . Ascites      DISPOSITION/PLAN     Admitted        Condition at Disposition: Stable        SCRIBE ATTESTATION STATEMENT  I Megan Short, SCRIBE scribed for Todd Bills, MD on 09/12/2016 at 12:35 PM.     Documentation assistance provided for Todd Bills, MD  by Rhunette Croft, SCRIBE. Information recorded by the scribe was done at my direction and has been reviewed and validated by me Todd Bills, MD.

## 2016-09-12 NOTE — ED Nurses Note (Signed)
Floor unable to take report at this time.

## 2016-09-12 NOTE — ED Nurses Note (Signed)
Limb alert bracelet placed left wrist.

## 2016-09-12 NOTE — H&P (Signed)
[facility]  [place], [place] [address]    General History and Physical    Honest, Vanleer D  Date of Admission:  09/12/2016  Date of Birth:  10-23-1970    PCP: No Pcp  Chief Complaint:  Chest pain.    HPI: [name] is a 46 y.o., Black/African American male who presents with chest pain that started around 7 o'clock this morning.  In the middle of chest.  Radiating to left arm.  Lasted few minutes then subsided.  Felt like this pain was similar to when he had heart attack in past.  Also complain of abdominal pain intermittent last few weeks.  He has been treated for C diff colitis off and on last few months.  Complain of fever at home yesterday.  Denies shortness of breath, cough, urinary symptoms, nausea, vomiting.  He has known history of end-stage renal disease. He missed his dialysis Friday.  No other complaints.      Active Hospital Problems   (*Primary Problem)    Diagnosis   . *Chest pain   . Anemia in CKD (chronic kidney disease)   . Smoker   . C. difficile diarrhea   . Noncompliance with renal dialysis (HCC)   . Volume overload   . ESRD (end stage renal disease) on dialysis (HCC)     Chronic   . Accelerated hypertension       Past Medical History:   Diagnosis Date   . Ascites    . Asthma    . Clostridium difficile infection 08/28/2016    +  C Diff 08/2016   . Dialysis patient (HCC)    . HTN (hypertension)    . Kidney failure    . MI, old        Past Surgical History:   Procedure Laterality Date   . AV FISTULA PLACEMENT     . HX CORONARY STENT PLACEMENT     . HX HERNIA REPAIR         Medications Prior to Admission     Prescriptions    albuterol sulfate 90 mcg/Actuation Inhalation HFA Aerosol Inhaler oral inhaler    Take 1-2 Puffs by inhalation Every 6 hours as needed    carvedilol (COREG) 25 mg Oral Tablet    Take 25 mg by mouth Twice daily with food    furosemide (LASIX) 80 mg Oral Tablet    Take 80 mg by mouth Once a day    hydrALAZINE (APRESOLINE) 100 mg Oral Tablet    Take 100  mg by mouth Three times a day    sevelamer (RENAGEL) 800 mg Oral Tablet    Take 800 mg by mouth Three times daily with meals    vancomycin (VANCOCIN) 25 mg/mL Oral Liquid    Take 20 mL (500 mg total) by mouth Four times a day for 18 days    losartan (COZAAR) 25 mg Oral Tablet    Take 1 Tab (25 mg total) by mouth Every 12 hours for 30 days    oxyCODONE (ROXICODONE) 5 mg Oral Tablet    Take 1 Tab (5 mg total) by mouth Every 6 hours as needed for Pain          Current Facility-Administered Medications:  acetaminophen (TYLENOL) tablet 1,000 mg Oral Now       Allergies   Allergen Reactions   . Lisinopril      Lip swelling     . Morphine      Throat  swelling     . Toradol [Ketorolac]      Breathing issues       Social History   Substance Use Topics   . Smoking status: Current Every Day Smoker     Packs/day: 0.25     Types: Cigarettes   . Smokeless tobacco: Never Used   . Alcohol use No       Family Medical History     None       Reviewed and negative for premature coronary artery disease and malignancy.    ROS: Other than ROS in the HPI, all other systems were negative.    DNR Status:  Prior    EXAM:  Temperature: 36.9 C (98.5 F)  Heart Rate: 97  BP (Non-Invasive): (!) 171/107  Respiratory Rate: 15  SpO2-1: 100 %  Pain Score (Numeric, Faces): 10  General: appears in good health. No distress.   Eyes: Pupils equal and round, reactive to light and accomodation.   HEENT: Head atraumatic and normocephalic   Neck: No JVD or thyromegaly or lymphadenopathy   Lungs:   Decreased air entry bilateral bases.  No rales or wheezing.   Cardiovascular: regular rate and rhythm, S1, S2 normal, no murmur  Abdomen: Soft, non-tender, Bowel sounds normal, No hepatosplenomegaly   Extremities: extremities normal, atraumatic, no cyanosis or edema   Skin: Skin warm and dry   Neurologic: Grossly normal   Lymphatics: No lymphadenopathy   Psychiatric:   Anxious.         Labs:    Lab Results for Last 24 Hours:    Results for orders placed or  performed during the hospital encounter of 09/12/16 (from the past 24 hour(s))   COMPREHENSIVE METABOLIC PROFILE - [facility] ONLY   Result Value Ref Range    SODIUM 140 136 - 145 mmol/L    POTASSIUM 5.2 (H) 3.4 - 5.1 mmol/L    CHLORIDE 105 101 - 111 mmol/L    CO2 TOTAL 24 22 - 32 mmol/L    ANION GAP 11 3 - 11 mmol/L    BUN 41 (H) 6 - 20 mg/dL    CREATININE 1.61 (H) 0.61 - 1.24 mg/dL    BUN/CREA RATIO 4 (L) 6 - 22    ESTIMATED GFR 7 (L) >60 mL/min/1.1m^2    ALBUMIN 3.4 (L) 3.5 - 5.0 g/dL    CALCIUM 6.7 (LL) 8.6 - 10.3 mg/dL    GLUCOSE 88 70 - 096 mg/dL    ALKALINE PHOSPHATASE 231 (H) 38 - 126 U/L    ALT (SGPT) 17 17 - 63 U/L    AST (SGOT) 26 15 - 41 U/L    BILIRUBIN TOTAL 0.4 0.3 - 1.2 mg/dL    PROTEIN TOTAL 5.7 (L) 6.4 - 8.3 g/dL    ALBUMIN/GLOBULIN RATIO 1.5 0.8 - 2.0   TROPONIN-I   Result Value Ref Range    TROPONIN I 0.05 <=0.06 ng/mL   Lipase   Result Value Ref Range    LIPASE 35 22 - 51 U/L   CBC WITH DIFF   Result Value Ref Range    WBC 4.3 4.0 - 11.0 x10^3/uL    RBC 2.53 (L) 4.40 - 5.80 x10^6/uL    HGB 8.0 (L) 13.5 - 18.0 g/dL    HCT 04.5 (L) 40.9 - 50.0 %    MCV 96.7 82.0 - 97.0 fL    MCH 31.5 27.5 - 33.2 pg    MCHC 32.6 32.0 - 36.0 g/dL    RDW 81.1 (H)  11.0 - 16.0 %    PLATELETS 107 (L) 150 - 450 x10^3/uL    MPV 7.4 7.4 - 10.5 fL    NEUTROPHIL % 74 43 - 76 %    LYMPHOCYTE % 10 (L) 15 - 43 %    MONOCYTE % 7 5 - 12 %    EOSINOPHIL % 9 (H) 0 - 5 %    BASOPHIL % 1 0 - 3 %    NEUTROPHIL # 3.20 1.50 - 6.50 x10^3/uL    LYMPHOCYTE # 0.40 (L) 1.00 - 4.80 x10^3/uL    MONOCYTE # 0.30 0.20 - 0.90 x10^3/uL    EOSINOPHIL # 0.40 0.00 - 0.50 x10^3/uL    BASOPHIL # 0.00 0.00 - 0.10 x10^3/uL       Imaging Studies:  -    DVT RISK FACTORS HAVE BEN ASSESSED AND PROPHYLAXIS ORDERED (SEE RUBYONLINE - REFERENCE TOOLS - MD, DVT PROPHY OR POCKET CARD)    Assessment/Plan:     1.  Chest pain.  Will rule him out for ACS.  Watch on tele monitor.  Start aspirin.  2.  End-stage renal disease with hyperkalemia.  He missed his dialysis  Friday.  Consult Renal Service.  Clinically volume overload but no hypoxia.  3.  Recurrent C diff colitis.  Restart oral vancomycin and Florastor.  4.  Hypertension.  Elevated.  Continue Coreg, Lasix, hydralazine.  Monitor vitals.  5.  Smoker.  Counseled to stop smoking.  Will offer nicotine patch.  6.  Anemia of chronic kidney disease.  Monitor hemoglobin.  Transfuse as needed.  7.  Noncompliance.  Noncompliant with medication, dialysis.  Counseled to take medication regularly and maintain dialysis schedule.      DVT prophylaxis. Lovenox.  Code status. Full code.        Portions of this note may be dictated using voice recognition software or a dictation service. Variances in spelling and vocabulary are possible and unintentional. Not all errors are caught/corrected. Please notify the [facility] Parkinauthor if any discrepancies are noted or if the meaning of any statement is not clear.

## 2016-09-13 LAB — CBC WITH DIFF
BASOPHIL #: 0 10*3/uL (ref 0.00–0.10)
BASOPHIL #: 0 x10ˆ3/uL (ref 0.00–0.10)
BASOPHIL %: 1 % (ref 0–3)
EOSINOPHIL #: 0.4 x10ˆ3/uL (ref 0.00–0.50)
EOSINOPHIL %: 9 % — ABNORMAL HIGH (ref 0–5)
HCT: 24.3 % — ABNORMAL LOW (ref 40.0–50.0)
HGB: 7.8 g/dL — ABNORMAL LOW (ref 13.5–18.0)
LYMPHOCYTE #: 0.5 x10ˆ3/uL — ABNORMAL LOW (ref 1.00–4.80)
LYMPHOCYTE %: 11 % — ABNORMAL LOW (ref 15–43)
MCH: 31.3 pg (ref 27.5–33.2)
MCHC: 32.1 g/dL (ref 32.0–36.0)
MCV: 97.5 fL — ABNORMAL HIGH (ref 82.0–97.0)
MONOCYTE #: 0.3 x10ˆ3/uL (ref 0.20–0.90)
MONOCYTE %: 7 % (ref 5–12)
MPV: 8.2 fL (ref 7.4–10.5)
NEUTROPHIL #: 3.1 x10ˆ3/uL (ref 1.50–6.50)
NEUTROPHIL %: 72 % (ref 43–76)
PLATELETS: 110 10*3/uL — ABNORMAL LOW (ref 150–450)
PLATELETS: 110 x10ˆ3/uL — ABNORMAL LOW (ref 150–450)
RBC: 2.5 x10ˆ6/uL — ABNORMAL LOW (ref 4.40–5.80)
RDW: 18.9 % — ABNORMAL HIGH (ref 11.0–16.0)
WBC: 4.3 x10ˆ3/uL (ref 4.0–11.0)

## 2016-09-13 LAB — BASIC METABOLIC PANEL
ANION GAP: 11 mmol/L (ref 3–11)
ANION GAP: 11 mmol/L (ref 3–11)
BUN/CREA RATIO: 5 — ABNORMAL LOW (ref 6–22)
BUN: 49 mg/dL — ABNORMAL HIGH (ref 6–20)
CALCIUM: 6.4 mg/dL — CL (ref 8.6–10.3)
CHLORIDE: 102 mmol/L (ref 101–111)
CO2 TOTAL: 25 mmol/L (ref 22–32)
CREATININE: 10.51 mg/dL — ABNORMAL HIGH (ref 0.61–1.24)
ESTIMATED GFR: 6 mL/min/1.73mˆ2 — ABNORMAL LOW (ref >60)
GLUCOSE: 118 mg/dL — ABNORMAL HIGH (ref 70–110)
GLUCOSE: 118 mg/dL — ABNORMAL HIGH (ref 70–110)
POTASSIUM: 5.3 mmol/L — ABNORMAL HIGH (ref 3.4–5.1)
SODIUM: 138 mmol/L (ref 136–145)

## 2016-09-13 LAB — ECG 12-LEAD
Atrial Rate: 102 {beats}/min
Calculated P Axis: 69 degrees
Calculated R Axis: -22 degrees
Calculated T Axis: 92 degrees
PR Interval: 170 ms
QRS Duration: 76 ms
QT Interval: 370 ms
QTC Calculation: 482 ms
Ventricular rate: 102 {beats}/min

## 2016-09-13 LAB — TROPONIN-I: TROPONIN I: 0.05 ng/mL (ref <=0.06)

## 2016-09-13 MED ORDER — HYDROMORPHONE 0.5 MG/0.5 ML INJECTION SYRINGE
INJECTION | INTRAMUSCULAR | Status: DC
Start: 2016-09-13 — End: 2016-09-13
  Filled 2016-09-13: qty 1

## 2016-09-13 MED ORDER — ALBUTEROL SULFATE CONCENTRATE 2.5 MG/0.5 ML SOLUTION FOR NEBULIZATION
2.5000 mg | INHALATION_SOLUTION | Freq: Four times a day (QID) | RESPIRATORY_TRACT | Status: DC | PRN
Start: 2016-09-13 — End: 2016-09-14
  Administered 2016-09-13 – 2016-09-14 (×2): 2.5 mg via RESPIRATORY_TRACT
  Filled 2016-09-13: qty 1

## 2016-09-13 MED ORDER — LEVOCARNITINE 200 MG/ML INTRAVENOUS SOLUTION
20.0000 mg/kg | INTRAVENOUS | Status: AC
Start: 2016-09-13 — End: 2016-09-13
  Administered 2016-09-13: 1600 mg via INTRAVENOUS
  Filled 2016-09-13: qty 10

## 2016-09-13 MED ORDER — DARBEPOETIN ALFA 100 MCG/0.5 ML IN POLYSORBATE INJECTION SYRINGE
100.00 ug | INJECTION | Freq: Once | INTRAMUSCULAR | Status: DC
Start: 2016-09-14 — End: 2016-09-14
  Filled 2016-09-13: qty 0.5

## 2016-09-13 MED ORDER — HEPARIN (PORCINE) 5,000 UNIT/ML INJECTION SOLUTION FOR DIALYSIS
4.4000 mL | INTRAMUSCULAR | Status: AC
Start: 2016-09-13 — End: 2016-09-13
  Administered 2016-09-13: 4.4 mL

## 2016-09-13 MED ORDER — HEPARIN (PORCINE) 1,000 UNIT/ML INJECTION FOR DIALYSIS
5000.0000 [IU] | INTRAMUSCULAR | Status: AC
Start: 2016-09-13 — End: 2016-09-13
  Administered 2016-09-13: 5000 [IU] via INTRAVENOUS

## 2016-09-13 MED ADMIN — furosemide 80 mg tablet: ORAL | @ 16:00:00

## 2016-09-13 MED ADMIN — sodium chloride 0.9 % intravenous solution: ORAL | @ 08:00:00 | NDC 00338004904

## 2016-09-13 MED ADMIN — dextrose 5 % in water (D5W) intravenous solution: ORAL | @ 21:00:00

## 2016-09-13 MED ADMIN — lactated Ringers intravenous solution: INTRAVENOUS | @ 10:00:00 | NDC 00338011704

## 2016-09-13 MED ADMIN — lactated Ringers intravenous solution: INTRAVENOUS | @ 06:00:00 | NDC 00338011704

## 2016-09-13 NOTE — Nurses Notes (Signed)
09/13/16 1300   Post Dialysis Vitals   Weight (Post Dialysis) 113 kg (249 lb 1.9 oz)   Weight 115.4 kg (254 lb 6.6 oz)   Weight Source Bed   Temp 36.7 C (98.1 F)   Temp Source Oral   BP (!) 193/112   BP Source (Non-Invasive) C;RA   Pulse 94   RR 16   End Treatment Kt/V 1.84   Patient was dialyzed for total of 3 hrs and 15 mins. Via left permacath.  HD machine was set-up as ordered: F180, 700 dfr and 400 bfr maintained.  Heparin 1000 units bolus and 1000 units/hr was given and Carnitor 1600 mf ivp.  Total net UF 2023 mls. bvp 75.0.  Dr. Leighton RoachIqbal was made aware of patients demand on his duration of treatment. This RN reiterated treatment plan and importance of completing treatment,patient giving an attitude with responding to this RN. Patient very well known for being non compliant with his dialysis treatment and refusing blood pressure offered with his high blood pressure.  With treatment patient complains of bad cramps even with Carnitor and demanded to be sitted at bedside while doing treatment which was explained to be very unsafe to be done.This RN watched patient closely for safety issues. Patient sleeping as transported back to his room by nursing staff.

## 2016-09-13 NOTE — Progress Notes (Signed)
Nephrology consult dictated.  ESRD. HD MWF. Will give HD extra treatment tomorrow as well.

## 2016-09-13 NOTE — Progress Notes (Signed)
IP PROGRESS NOTE      [name],[name]  Date of Admission:  09/12/2016  Date of Birth:  Jun 28, 1970  Date of Service:  09/13/2016    Chief Complaint:   No more chest pain.  Subjective:   Generalized abdominal pain.  We need to drain my abdominal fluid.  No fever, nausea, vomiting.  No other complaints.      Vital Signs:  Temp (24hrs) Max:36.9 C (98.5 F)      Temperature: 36.3 C (97.4 F)  BP (Non-Invasive): (!) 165/109  MAP (Non-Invasive): 120 mmHG  Heart Rate: 82  Respiratory Rate: 18  Pain Score (Numeric, Faces): 6  SpO2-1: 96 %    Current Medications:    Current Facility-Administered Medications:  acetaminophen (TYLENOL) tablet 1,000 mg Oral Now   acetaminophen (TYLENOL) tablet 650 mg Oral Q6H PRN   albuterol 90 mcg per inhalation oral inhaler - "Respiratory to administer" 2 Puff Inhalation Q6H PRN   aspirin (ECOTRIN) enteric coated tablet 81 mg 81 mg Oral Daily   calcium carbonate-vitamin D3 1250mg  (500mg )-200 units per tablet 1 Tab Oral 3x/day   carvedilol (COREG) tablet 25 mg Oral 2x/day-Food   furosemide (LASIX) tablet 80 mg Oral Daily   heparin 5,000 unit/mL injection 5,000 Units Subcutaneous Q8HRS   heparin 5,000 unit/mL injection 4.4 mL Intracatheter Give in Dialysis   hydrALAZINE (APRESOLINE) tablet 100 mg Oral 3x/day   HYDROmorphone (DILAUDID) 2 mg/mL injection 1 mg Intravenous Q4H PRN   nitroGLYCERIN (NITROSTAT) sublingual tablet 0.4 mg Sublingual Q5 Min PRN   NS flush syringe 10 mL Intravenous Q8HRS   NS flush syringe 10 mL Intravenous Q1H PRN   promethazine (PHENERGAN) 25 mg/mL IM injection 25 mg Intramuscular Q6H PRN   saccharomyces boulardii (FLORASTOR) capsule 250 mg Oral Daily   sevelamer (RENAGEL) tablet 800 mg Oral 3x/day-Meals   vancomycin 25 mg per mL oral liquid with cherry flavoring 500 mg Oral 4x/day       Today's Physical Exam:  General: appears in good health. No distress.   Eyes: Pupils equal and round, reactive to light and accomodation.   HENT:Head atraumatic and normocephalic   Neck:  No JVD or thyromegaly or lymphadenopathy   Lungs: CTAB, non labored breathing, no rales or wheezing.    Cardiovascular: regular rate and rhythm, S1, S2 normal, no murmur,   Abdomen: Soft, non-tender, Bowel sounds normal, No hepatosplenomegaly.  Distension.     Extremities: extremities normal, atraumatic, no cyanosis or edema   Skin: Skin warm and dry   Neurologic: Grossly normal   Psychiatric: Normal affect, behavior,         I/O:  I/O last 24 hours:    Intake/Output Summary (Last 24 hours) at 09/13/16 1210  Last data filed at 09/13/16 0620   Gross per 24 hour   Intake             1440 ml   Output                0 ml   Net             1440 ml     I/O current shift:         Labs  Please indicate ordered or reviewed)  Reviewed:   Lab Results for Last 24 Hours:    Results for orders placed or performed during the hospital encounter of 09/12/16 (from the past 24 hour(s))   ECG 12-LEAD (Take to provider with a brief history)   Result Value Ref  Range    Ventricular rate 102 BPM    Atrial Rate 102 BPM    PR Interval 170 ms    QRS Duration 76 ms    QT Interval 370 ms    QTC Calculation 482 ms    Calculated P Axis 69 degrees    Calculated R Axis -22 degrees    Calculated T Axis 92 degrees   RED TOP TUBE   Result Value Ref Range    RAINBOW/EXTRA TUBE AUTO RESULT Yes    COMPREHENSIVE METABOLIC PROFILE - [facility] ONLY   Result Value Ref Range    SODIUM 140 136 - 145 mmol/L    POTASSIUM 5.2 (H) 3.4 - 5.1 mmol/L    CHLORIDE 105 101 - 111 mmol/L    CO2 TOTAL 24 22 - 32 mmol/L    ANION GAP 11 3 - 11 mmol/L    BUN 41 (H) 6 - 20 mg/dL    CREATININE 6.96 (H) 0.61 - 1.24 mg/dL    BUN/CREA RATIO 4 (L) 6 - 22    ESTIMATED GFR 7 (L) >60 mL/min/1.96m2    ALBUMIN 3.4 (L) 3.5 - 5.0 g/dL    CALCIUM 6.7 (LL) 8.6 - 10.3 mg/dL    GLUCOSE 88 70 - 295 mg/dL    ALKALINE PHOSPHATASE 231 (H) 38 - 126 U/L    ALT (SGPT) 17 17 - 63 U/L    AST (SGOT) 26 15 - 41 U/L    BILIRUBIN TOTAL 0.4 0.3 - 1.2 mg/dL    PROTEIN TOTAL 5.7 (L) 6.4 - 8.3 g/dL     ALBUMIN/GLOBULIN RATIO 1.5 0.8 - 2.0   TROPONIN-I   Result Value Ref Range    TROPONIN I 0.05 <=0.06 ng/mL   Lipase   Result Value Ref Range    LIPASE 35 22 - 51 U/L   CBC WITH DIFF   Result Value Ref Range    WBC 4.3 4.0 - 11.0 x103/uL    RBC 2.53 (L) 4.40 - 5.80 x106/uL    HGB 8.0 (L) 13.5 - 18.0 g/dL    HCT 28.4 (L) 13.2 - 50.0 %    MCV 96.7 82.0 - 97.0 fL    MCH 31.5 27.5 - 33.2 pg    MCHC 32.6 32.0 - 36.0 g/dL    RDW 44.0 (H) 10.2 - 16.0 %    PLATELETS 107 (L) 150 - 450 x103/uL    MPV 7.4 7.4 - 10.5 fL    NEUTROPHIL % 74 43 - 76 %    LYMPHOCYTE % 10 (L) 15 - 43 %    MONOCYTE % 7 5 - 12 %    EOSINOPHIL % 9 (H) 0 - 5 %    BASOPHIL % 1 0 - 3 %    NEUTROPHIL # 3.20 1.50 - 6.50 x103/uL    LYMPHOCYTE # 0.40 (L) 1.00 - 4.80 x103/uL    MONOCYTE # 0.30 0.20 - 0.90 x103/uL    EOSINOPHIL # 0.40 0.00 - 0.50 x103/uL    BASOPHIL # 0.00 0.00 - 0.10 x103/uL   BLUE TOP TUBE   Result Value Ref Range    RAINBOW/EXTRA TUBE AUTO RESULT Yes    TROPONIN-I   Result Value Ref Range    TROPONIN I 0.05 <=0.06 ng/mL   TROPONIN-I   Result Value Ref Range    TROPONIN I 0.05 <=0.06 ng/mL   TROPONIN-I   Result Value Ref Range    TROPONIN I 0.05 <=0.06 ng/mL   BASIC METABOLIC PANEL  Result Value Ref Range    SODIUM 138 136 - 145 mmol/L    POTASSIUM 5.3 (H) 3.4 - 5.1 mmol/L    CHLORIDE 102 101 - 111 mmol/L    CO2 TOTAL 25 22 - 32 mmol/L    ANION GAP 11 3 - 11 mmol/L    CALCIUM 6.4 (LL) 8.6 - 10.3 mg/dL    GLUCOSE 161 (H) 70 - 110 mg/dL    BUN 49 (H) 6 - 20 mg/dL    CREATININE 09.60 (H) 0.61 - 1.24 mg/dL    BUN/CREA RATIO 5 (L) 6 - 22    ESTIMATED GFR 6 (L) >60 mL/min/1.42m2   CBC WITH DIFF   Result Value Ref Range    WBC 4.3 4.0 - 11.0 x103/uL    RBC 2.50 (L) 4.40 - 5.80 x106/uL    HGB 7.8 (L) 13.5 - 18.0 g/dL    HCT 45.4 (L) 09.8 - 50.0 %    MCV 97.5 (H) 82.0 - 97.0 fL    MCH 31.3 27.5 - 33.2 pg    MCHC 32.1 32.0 - 36.0 g/dL    RDW 11.9 (H) 14.7 - 16.0 %    PLATELETS 110 (L) 150 - 450 x103/uL    MPV 8.2 7.4 - 10.5 fL    NEUTROPHIL  % 72 43 - 76 %    LYMPHOCYTE % 11 (L) 15 - 43 %    MONOCYTE % 7 5 - 12 %    EOSINOPHIL % 9 (H) 0 - 5 %    BASOPHIL % 1 0 - 3 %    NEUTROPHIL # 3.10 1.50 - 6.50 x103/uL    LYMPHOCYTE # 0.50 (L) 1.00 - 4.80 x103/uL    MONOCYTE # 0.30 0.20 - 0.90 x103/uL    EOSINOPHIL # 0.40 0.00 - 0.50 x103/uL    BASOPHIL # 0.00 0.00 - 0.10 x103/uL       Radiology Tests (Please indicate ordered or reviewed)  Reviewed: N/A      Problem List:  Active Hospital Problems   (*Primary Problem)    Diagnosis    *Chest pain    Anemia in CKD (chronic kidney disease)    Smoker    C. difficile diarrhea    Noncompliance with renal dialysis (HCC)    Volume overload    ESRD (end stage renal disease) on dialysis (HCC)     Chronic    Accelerated hypertension       Assessment/ Plan:     1.  Chest pain. No ACS.  No further workup at this time.  Continue aspirin.  2.  End-stage renal disease with hyperkalemia.   Continue dialysis today and tomorrow per Renal recommendation. Clinically volume overload but no hypoxia.  Case Management consulted as patient is requesting dialysis chair in [place].  3.  Recurrent C diff colitis.   No diarrhea today.  Continue oral vancomycin and Florastor.  4.  Hypertension.  Elevated.  Continue Coreg, Lasix, hydralazine.  Monitor vitals.  5.  Smoker.  Counseled to stop smoking. Nicotine patch.  6.  Anemia of chronic kidney disease.  Monitor hemoglobin.  Transfuse as needed.  7.  Noncompliance.  Noncompliant with medication, dialysis.  Counseled to take medication regularly and maintain dialysis schedule.  8.  Large ascites.  Patient is needing frequent paracentesis.  Consult Radiology.      DVT prophylaxis. Lovenox.  Code status. Full code.  Disposition.  Likely home tomorrow.

## 2016-09-13 NOTE — Nurses Notes (Addendum)
Patient stated he was recently diagnosed with sleep apnea and was supposed to get a CPAP machine, but did not pick it up. Informed patient I would call the doctor and get RT to bring one up, pt refused. Informed patient of risks vs benefits - patient still refused.     Patient c/o left side chest pain - stated it was the same as when he came in and the pain medication only make it tolerable, and the pain has not gone away since a few days ago. VSS, tele - NSR HR 70s. Patient falls back asleep easily and appears to be comfortably aat. Will continue to monitor.

## 2016-09-13 NOTE — Nurses Notes (Signed)
Pt blood pressure taken. Pt refusing other vital signs. Morning hydralazine and furosemide administered att.

## 2016-09-13 NOTE — Nurses Notes (Signed)
Patient's Aranesp is due to 0000.  Spoke with pharmacy, will inform dayshift to let HD know regarding this medication.

## 2016-09-13 NOTE — Nurses Notes (Signed)
Urinal provided to patient and informed of needing urine specimen. Pt stated "i dont pee anymore".     Stool collection hat in bathroom.    Patient informed to let staff know if he does void/have a bm so we can send it to the lab.

## 2016-09-13 NOTE — Nurses Notes (Addendum)
Patient refused manual BP to recheck his Blood Pressure. Tried to explain his BP of 165/109 was elevated and need  BP for  medications-Patient replied " No its not necessary"- no BP done. Patient also refusing Heparin SQ.

## 2016-09-14 ENCOUNTER — Observation Stay (HOSPITAL_BASED_OUTPATIENT_CLINIC_OR_DEPARTMENT_OTHER): Payer: Medicare Other

## 2016-09-14 DIAGNOSIS — Z992 Dependence on renal dialysis: Secondary | ICD-10-CM

## 2016-09-14 DIAGNOSIS — Z9115 Patient's noncompliance with renal dialysis: Secondary | ICD-10-CM

## 2016-09-14 DIAGNOSIS — A0471 Enterocolitis due to Clostridium difficile, recurrent: Secondary | ICD-10-CM

## 2016-09-14 DIAGNOSIS — N186 End stage renal disease: Secondary | ICD-10-CM

## 2016-09-14 DIAGNOSIS — D631 Anemia in chronic kidney disease: Secondary | ICD-10-CM

## 2016-09-14 LAB — C. DIFFICILE PCR
C. DIFFICILE TOXIN GENE, PCR: POSITIVE — AB
PRESUMPTIVE 027/NAP1/BI: POSITIVE — AB

## 2016-09-14 MED ORDER — CALCIUM CARBONATE 200 MG CALCIUM (500 MG) CHEWABLE TABLET
500.00 mg | CHEWABLE_TABLET | Freq: Three times a day (TID) | ORAL | Status: DC
Start: 2016-09-14 — End: 2016-09-14
  Administered 2016-09-14: 500 mg via ORAL
  Filled 2016-09-14: qty 1

## 2016-09-14 MED ORDER — HYDROMORPHONE 2 MG/ML INJECTION SOLUTION
1.00 mg | Freq: Once | INTRAMUSCULAR | Status: AC
Start: 2016-09-14 — End: 2016-09-14
  Administered 2016-09-14: 1 mg via INTRAVENOUS
  Filled 2016-09-14: qty 1

## 2016-09-14 MED ORDER — DIPHENHYDRAMINE 50 MG CAPSULE
50.00 mg | ORAL_CAPSULE | Freq: Four times a day (QID) | ORAL | Status: DC | PRN
Start: 2016-09-14 — End: 2016-09-14
  Administered 2016-09-14: 50 mg via ORAL
  Filled 2016-09-14: qty 1

## 2016-09-14 MED ORDER — LIDOCAINE 1 %-EPINEPHRINE 1:100,000 INJECTION SOLUTION
15.0000 mL | INTRAMUSCULAR | Status: DC
Start: 2016-09-14 — End: 2016-09-14

## 2016-09-14 MED ORDER — LIDOCAINE 1 %-EPINEPHRINE 1:100,000 INJECTION SOLUTION
INTRAMUSCULAR | Status: DC
Start: 2016-09-14 — End: 2016-09-14
  Administered 2016-09-14: 0 mL via INTRAMUSCULAR
  Filled 2016-09-14: qty 20

## 2016-09-14 MED ADMIN — sodium chloride 0.9 % (flush) injection syringe: INTRAVENOUS

## 2016-09-14 MED ADMIN — lactated Ringers intravenous solution: ORAL | @ 10:00:00 | NDC 00338011704

## 2016-09-14 NOTE — Progress Notes (Signed)
Sleeping. HD yesterday, and Dr. Leighton RoachIqbal ordered supplemental HD treatment today to make up for missed HD recently.   I changed diet to renal, and changed calcium with D to Tums with meals.   May discharge when you feel he is ready.

## 2016-09-14 NOTE — Nurses Notes (Signed)
Patient requested PRN breathing tx to be placed under as needed. Informed patient of it being every 6 hours PRN, patient stated he did not understand that and informed patient of home medication list stating his IH was every 6 hours as needed and that was what his inhaler here was ordered as along with the breathing treatment (neb).  Patient stated "that was wrong, I told the triage nurse that I take it just as needed".  Informed patient of medication needing a frequency and amount to be prescribed, pt stated "i dont understand all that, I just take it when I need it". Asked patient how often he takes his neb at home, pt stated "I take my Inhaler often but if it doesn't work then I do my neb, but I only do it about once a month"

## 2016-09-14 NOTE — Nurses Notes (Signed)
No return call from previous page. Dr Bing Quarryebord paged aat.

## 2016-09-14 NOTE — Care Management Notes (Signed)
[facility].  They will await his decision as to what clinic he wants.  He is only allowed one choice.  He also has a history of non compliance so he may not be accepted.

## 2016-09-14 NOTE — Consults (Signed)
[facility]  [place], [place] [address]    Infectious Disease Consultation    Uriel, Dowding, 46 y.o. male  Date of Admission:  09/12/2016  Date of Birth:  01/09/1971      Service: Medicine  Attending: Dr Okey Regal Problems   (*Primary Problem)    Diagnosis   . *Chest pain   . Anemia in CKD (chronic kidney disease)   . Smoker   . C. difficile diarrhea   . Noncompliance with renal dialysis (HCC)   . Volume overload   . ESRD (end stage renal disease) on dialysis (HCC)     Chronic   . 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 He should be evaluated for fecal transplantation, or possibly colectomy.    1. Can discharge home on PO Vancomycin, pending referral to fecal transplant center for more definitive treatment. Attempt month-long pulse as follows:  500 mg QID x 2 weeks, 500 mg BID x 2 weeks, 500 mg daily for 4 days, 500 mg every other day for 4 doses.  2. Recommend patient establish with PCP as he has multiple significant medical issues. He is in the process of moving and has a history of noncompliance which complicates his already complicated medical history.    Information Obtained from: patient and history reviewed via medical record      HPI/Discussion:  MUSCAB BRENNEMAN is a 46 y.o., Black/African American male who presented yesterday with chest pain after missing dialysis, with negative serial troponins. Symptoms improved with dialysis. He has recurrent diarrhea and was taking oral vancomycin at home, from prior admission 2 weeks ago. This morning PCR testing was positive for c.diff presumptive 027/NAP1/B1. CT abd did not show colitis. He reports 7-8 loose watery stools per day with increased foul odor. He reports being treated 15 times in the last year  without improvement. He lives in [place], [place] but plans to move here once he gets his townhouse sold. He does not have a PCP at this time. Previously it was recommended that he seek fecal transplantation referral but he has not done this. He is afebrile but temperature was increasing this morning to 99.34F. No leukocytosis.    PMH:  Past Medical History:   Diagnosis Date   . Ascites    . Asthma    . Clostridium difficile infection 08/28/2016    +  C Diff 08/2016   . Dialysis patient (HCC)    . HTN (hypertension)    . Kidney failure    . MI, old          PSH:  Past Surgical History:   Procedure Laterality Date   . AV FISTULA PLACEMENT     . HX CORONARY STENT PLACEMENT     . HX HERNIA REPAIR           Allergies   Allergen Reactions   . Lisinopril      Lip swelling     . Morphine      Throat swelling     . Toradol [Ketorolac]      Breathing issues       Family History:  Family Medical History     None  Medications Prior to Admission     Prescriptions    albuterol sulfate 90 mcg/Actuation Inhalation HFA Aerosol Inhaler oral inhaler    Take 1-2 Puffs by inhalation Every 6 hours as needed    carvedilol (COREG) 25 mg Oral Tablet    Take 25 mg by mouth Twice daily with food    furosemide (LASIX) 80 mg Oral Tablet    Take 80 mg by mouth Once a day    hydrALAZINE (APRESOLINE) 100 mg Oral Tablet    Take 100 mg by mouth Three times a day    sevelamer (RENAGEL) 800 mg Oral Tablet    Take 800 mg by mouth Three times daily with meals    vancomycin (VANCOCIN) 25 mg/mL Oral Liquid    Take 20 mL (500 mg total) by mouth Four times a day for 18 days    losartan (COZAAR) 25 mg Oral Tablet    Take 1 Tab (25 mg total) by mouth Every 12 hours for 30 days    oxyCODONE (ROXICODONE) 5 mg Oral Tablet    Take 1 Tab (5 mg total) by mouth Every 6 hours as needed for Pain              ROS:   Constitutional: positive for fevers, chills, fatigue and anorexia  Eyes: negative  Ears, nose, mouth, throat, and face: negative   Respiratory: positive for dyspnea  Cardiovascular: positive for chest pain  Gastrointestinal: positive for nausea, vomiting, diarrhea and abdominal pain  Genitourinary:negative  Integument/breast: positive for rash and upper chest non-pruritic  Hematologic/lymphatic: positive for easy bruising  Musculoskeletal:positive for back pain  Neurological: negative  Behavioral/Psych: negative  Endocrine: negative  Allergic/Immunologic: negative  All other ROS Negative    EXAM:  Temp  Avg: 36.7 C (98 F)  Min: 36.3 C (97.3 F)  Max: 37.5 C (99.5 F)    Awake, alert, oriented to person, place. Appears chronically ill.  Normocephalic, no icterus, PERRLA, EOMI, no scleral injection or purulence  Nasal passages patent, no purulent drainage  No oral lesions, thrush, tonsils not enlarged, uvula midline  No cervical lymphadenopathy, trachea midline  Mild macular lesions upper chest no jaundice, or erythema.  Clear to auscultation bilaterally  RRR no murmurs  Abdomen distended, tender  No CVA tenderness  No Spinal tenderness, no step offs  Moves all extremities  Trace peripheral edema  No focal neurological deficits. Cranial nerves intact.    Labs:    Relevant labs:  Results for QUINTO, TIPPY (MRN [id number]) as of 09/14/2016 10:16   Ref. Range 09/13/2016 10:10   WBC Latest Ref Range: 4.0 - 11.0 x10^3/uL 4.3   HGB Latest Ref Range: 13.5 - 18.0 g/dL 7.8 (L)   HCT Latest Ref Range: 40.0 - 50.0 % 24.3 (L)   PLATELET COUNT Latest Ref Range: 150 - 450 x10^3/uL 110 (L)   RBC Latest Ref Range: 4.40 - 5.80 x10^6/uL 2.50 (L)   MCV Latest Ref Range: 82.0 - 97.0 fL 97.5 (H)   MCHC Latest Ref Range: 32.0 - 36.0 g/dL 54.0   MCH Latest Ref Range: 27.5 - 33.2 pg 31.3   RDW Latest Ref Range: 11.0 - 16.0 % 18.9 (H)   MPV Latest Ref Range: 7.4 - 10.5 fL 8.2   PMN'S Latest Ref Range: 43 - 76 % 72   LYMPHOCYTES Latest Ref Range: 15 - 43 % 11 (L)   EOSINOPHIL Latest Ref Range: 0 - 5 % 9 (H)   MONOCYTES Latest Ref Range:  5 - 12 % 7   BASOPHILS Latest Ref  Range: 0 - 3 % 1   PMN ABS Latest Ref Range: 1.50 - 6.50 x10^3/uL 3.10   LYMPHS ABS Latest Ref Range: 1.00 - 4.80 x10^3/uL 0.50 (L)   EOS ABS Latest Ref Range: 0.00 - 0.50 x10^3/uL 0.40   MONOS ABS Latest Ref Range: 0.20 - 0.90 x10^3/uL 0.30   BASOS ABS Latest Ref Range: 0.00 - 0.10 x10^3/uL 0.00   SODIUM Latest Ref Range: 136 - 145 mmol/L 138   POTASSIUM Latest Ref Range: 3.4 - 5.1 mmol/L 5.3 (H)   CHLORIDE Latest Ref Range: 101 - 111 mmol/L 102   CARBON DIOXIDE Latest Ref Range: 22 - 32 mmol/L 25   BUN Latest Ref Range: 6 - 20 mg/dL 49 (H)   CREATININE Latest Ref Range: 0.61 - 1.24 mg/dL 16.10 (H)   GLUCOSE Latest Ref Range: 70 - 110 mg/dL 960 (H)   ANION GAP Latest Ref Range: 3 - 11 mmol/L 11   BUN/CREAT RATIO Latest Ref Range: 6 - 22  5 (L)   ESTIMATED GLOMERULAR FILTRATION RATE Latest Ref Range: >60 mL/min/1.81m^2 6 (L)   CALCIUM Latest Ref Range: 8.6 - 10.3 mg/dL 6.4 (LL)       Microbiology:   09/14/2016 4:23 AM - [place], [place]      Component Results     Component Value Ref Range & Units Status    CLOSTRIDIUM DIFFICILE TOXIN, PCR Positive (A) Negative, Indeterminate Final    PRESUMPTIVE 027/NAP1/BI Positive (A) Not Detected, Negative Final         Imaging Studies:     CT ABDOMEN PELVIS WO IV CONTRAST                          Conclusion           Study Result     RADIOLOGIST: Richardean Chimera, MD    EXAMINATION: CT ABDOMEN PELVIS WO IV CONTRAST     CLINICAL INDICATION: abd pain with distension and reported fever.    TECHNIQUE: Axial plane images were performed. Coronal and Sagittal  reformatted image were obtained.    COMPARISON: None    FINDINGS:     UPPER ABDOMEN ORGANS: Limited evaluation due to absence of iv contrast.  *  LIVER: Normal appearance.  *  GALLBLADDER: Normal appearance.  *  BILIARY TREE: Normal appearance.  *  PANCREAS: Normal appearance.  *  SPLEEN: Normal appearance.  *  ADRENALS: Normal appearance.  *  RIGHT KIDNEY: Atrophic   *  LEFT KIDNEY: Atrophic    MISCELLANEOUS   *  ABDOMINAL WALL: Generalized abdominal wall edema and anasarca  *  VESSELS: Limited evaluation due to absence of iv contrast. Normal  appearance.  *  LYMPH NODES: Normal appearance.  *  MESENTERY/PERITONEAL CAVITY: Normal appearance.  RETROPERITONEUM: Normal appearance.    GI TRACT: Limited evaluation due to absence of oral and iv contrast.  *  STOMACH: Normal appearance.  *  SMALL BOWEL:  Normal appearance.  *  LARGE BOWEL:  Normal appearance.    PELVIS  *  URINARY BLADDER: Normal appearance.  *  REPRODUCTIVE ORGANS:  Normal appearance.  *  PELVIC SOFT TISSUES: Normal appearance.  *  PELVIC FLOOR: There is large volume ascites.  *  GROINS: Normal appearance.  *  BONY PELVIS: Normal appearance.    OTHERS  *  SPINE: Normal appearance.    *  LUNG BASES:  Normal appearance.    IMPRESSION:  1. Large volume ascites   2. Atrophic kidneys  3. Additional comment: No inflammatory changes or mass lesions.           Janalyn HarderSarah Cross, PA-C    Patient reviewed with Janalyn HarderSarah Cross PA-C. I have reviewed the case and agree with the assessment and plan. Any exceptions are noted above.    Fuller SongMatthew Edward Sanyia Dini, MD

## 2016-09-14 NOTE — Nurses Notes (Signed)
ID Consult placed by Pharmacy r/t ongoing Cdiff infection for over 2 weeks and was on PO Vanc at home. Patient also has NAP1 + which increases virulent - per pharmacy.  However, No ID on call until Thursday, May 3rd.  Will inform dayshift to discuss with Days[facility] doctors regarding this.

## 2016-09-14 NOTE — Nurses Notes (Signed)
Dr. Bing Quarryebord paged through paging system informed of patient status, along with rash and c/o CP.  Orders placed for benadryl. Central supply to be contacted once in hospital to see about hypoallergic sheets.

## 2016-09-14 NOTE — Care Plan (Signed)
Problem: Patient Care Overview (Adult,OB)  Goal: Plan of Care Review(Adult,OB)  The patient and/or their representative will communicate an understanding of their plan of care   Outcome: Ongoing (see interventions/notes)   09/14/16 0113   Coping/Psychosocial   Plan Of Care Reviewed With patient       Comments: At 1900: Received report from offgoing nurse, Tammy SoursGreg .  Patient appears asleep in bed, with no s/s of distress. Patient on NC 2L.  PIV SL. Tele - NSR. Reviewed labs, notes, and vital signs from previous shift. Assessment to be completed and medications to be administered per doctor orders. Patient denies any needs at this time. Will continue to monitor.    During Shift:  Patient c/o leg pain, abd pain and chest pain (see previous note).  Pt stated he is going to have a paracentesis today, and stated he has them typically done weekly. CP - no interventions provided relieve pain, only made it more tolerable - per pt. However, patient falls back asleep easily without any signs of distress. Pt awakes from sleep c/o 8/10 pain but falls back asleep quickly.  Patient requested breathing treatment earlier, refused IH and requested Nebs instead. Nebs ordered and provided per RT.  Pt requested breathing treatment at 0045, informed patient of order and patient stated he was "struggling to breath", RR 20 and SpO2 100% on NC 2L with no signs of respiratory distress. RT informed and informed they would be around to check him.  Pt stated his only BM was yesterday after breakfast.  Refuses CPAP.  Dialysis catheter site WDL, Green caps intact.  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  No further complaints/questions - will continue to monitor.

## 2016-09-14 NOTE — Nurses Notes (Signed)
Patient stated his chest pain was much better, but now his abd and legs were hurting.  PRN pain medication administered as ordered.

## 2016-09-14 NOTE — Progress Notes (Signed)
IP PROGRESS NOTE      [name], [name]  Date of Admission:  09/12/2016  Date of Birth:  [birth year]  Date of Service:  09/14/2016    Chief Complaint:   No chest pain.  Subjective:   Wants paracentesis done today.  No fever, nausea, vomiting.  No new complaints.      Vital Signs:  Temp (24hrs) Max:37.5 C (99.5 F)      Temperature: 37.5 C (99.5 F)  BP (Non-Invasive): (!) 159/104  MAP (Non-Invasive): 119 mmHG  Heart Rate: 98  Respiratory Rate: 19  Pain Score (Numeric, Faces): 9  SpO2-1: 100 %    Current Medications:    Current Facility-Administered Medications:  acetaminophen (TYLENOL) tablet 650 mg Oral Q6H PRN   albuterol (PROVENTIL) 2.5mg / 0.5 mL nebulizer solution 2.5 mg Nebulization Q6H PRN   aspirin (ECOTRIN) enteric coated tablet 81 mg 81 mg Oral Daily   calcium carbonate (TUMS) chewable tablet 500 mg Oral 3x/day-Meals   carvedilol (COREG) tablet 25 mg Oral 2x/day-Food   darbepoetin alfa in polysorbate (ARANESP) 100 mcg/0.5 mL injection 100 mcg Intravenous Once   diphenhydrAMINE (BENADRYL) capsule 50 mg Oral Q6H PRN   furosemide (LASIX) tablet 80 mg Oral Daily   heparin 5,000 unit/mL injection 5,000 Units Subcutaneous Q8HRS   hydrALAZINE (APRESOLINE) tablet 100 mg Oral 3x/day   lidocaine 1%-EPINEPHrine 1:100,000 injection 15 mL Injection Give in IR   nitroGLYCERIN (NITROSTAT) sublingual tablet 0.4 mg Sublingual Q5 Min PRN   NS flush syringe 10 mL Intravenous Q8HRS   NS flush syringe 10 mL Intravenous Q1H PRN   promethazine (PHENERGAN) 25 mg/mL IM injection 25 mg Intramuscular Q6H PRN   saccharomyces boulardii (FLORASTOR) capsule 250 mg Oral Daily   sevelamer (RENAGEL) tablet 800 mg Oral 3x/day-Meals   vancomycin 25 mg per mL oral liquid with cherry flavoring 500 mg Oral 4x/day       Today's Physical Exam:  General: appears in good health. No distress.   Eyes: Pupils equal and round, reactive to light and accomodation.   HENT:Head atraumatic and normocephalic   Neck: No JVD or thyromegaly or lymphadenopathy    Lungs: CTAB, non labored breathing, no rales or wheezing.    Cardiovascular: regular rate and rhythm, S1, S2 normal, no murmur,   Abdomen: Soft, non-tender, Bowel sounds normal, No hepatosplenomegaly.  Distension.     Extremities: extremities normal, atraumatic, no cyanosis or edema   Skin: Skin warm and dry   Neurologic: Grossly normal   Psychiatric: Normal affect, behavior,         I/O:  I/O last 24 hours:      Intake/Output Summary (Last 24 hours) at 09/14/16 0951  Last data filed at 09/13/16 2100   Gross per 24 hour   Intake              720 ml   Output             2023 ml   Net            -1303 ml     I/O current shift:         Labs  Please indicate ordered or reviewed)  Reviewed:   Lab Results for Last 24 Hours:    Results for orders placed or performed during the hospital encounter of 09/12/16 (from the past 24 hour(s))   BASIC METABOLIC PANEL   Result Value Ref Range    SODIUM 138 136 - 145 mmol/L    POTASSIUM 5.3 (H) 3.4 -  5.1 mmol/L    CHLORIDE 102 101 - 111 mmol/L    CO2 TOTAL 25 22 - 32 mmol/L    ANION GAP 11 3 - 11 mmol/L    CALCIUM 6.4 (LL) 8.6 - 10.3 mg/dL    GLUCOSE 784118 (H) 70 - 110 mg/dL    BUN 49 (H) 6 - 20 mg/dL    CREATININE 69.6210.51 (H) 0.61 - 1.24 mg/dL    BUN/CREA RATIO 5 (L) 6 - 22    ESTIMATED GFR 6 (L) >60 mL/min/1.481m^2   CBC WITH DIFF   Result Value Ref Range    WBC 4.3 4.0 - 11.0 x10^3/uL    RBC 2.50 (L) 4.40 - 5.80 x10^6/uL    HGB 7.8 (L) 13.5 - 18.0 g/dL    HCT 95.224.3 (L) 84.140.0 - 50.0 %    MCV 97.5 (H) 82.0 - 97.0 fL    MCH 31.3 27.5 - 33.2 pg    MCHC 32.1 32.0 - 36.0 g/dL    RDW 32.418.9 (H) 40.111.0 - 16.0 %    PLATELETS 110 (L) 150 - 450 x10^3/uL    MPV 8.2 7.4 - 10.5 fL    NEUTROPHIL % 72 43 - 76 %    LYMPHOCYTE % 11 (L) 15 - 43 %    MONOCYTE % 7 5 - 12 %    EOSINOPHIL % 9 (H) 0 - 5 %    BASOPHIL % 1 0 - 3 %    NEUTROPHIL # 3.10 1.50 - 6.50 x10^3/uL    LYMPHOCYTE # 0.50 (L) 1.00 - 4.80 x10^3/uL    MONOCYTE # 0.30 0.20 - 0.90 x10^3/uL    EOSINOPHIL # 0.40 0.00 - 0.50 x10^3/uL    BASOPHIL # 0.00  0.00 - 0.10 x10^3/uL   C. DIFFICILE PCR TESTING - [facility] ONLY   Result Value Ref Range    CLOSTRIDIUM DIFFICILE TOXIN, PCR Positive (A) Negative, Indeterminate    PRESUMPTIVE 027/NAP1/BI Positive (A) Not Detected, Negative       Radiology Tests (Please indicate ordered or reviewed)  Reviewed: N/A      Problem List:  Active Hospital Problems   (*Primary Problem)    Diagnosis   . *Chest pain   . Anemia in CKD (chronic kidney disease)   . Smoker   . C. difficile diarrhea   . Noncompliance with renal dialysis (HCC)   . Volume overload   . ESRD (end stage renal disease) on dialysis (HCC)     Chronic   . Accelerated hypertension       Assessment/ Plan:     1.  Chest pain. No ACS. No further workup at this time.  Continue aspirin.  2.  End-stage renal disease with hyperkalemia. Dialysis today per Renal recommendation. Case management consulted as patient is requesting dialysis chair in [place].  3.  Recurrent C diff colitis.   No diarrhea today.  Continue oral vancomycin and Florastor.  4.  Hypertension. Continue Coreg, Lasix, hydralazine.  Monitor vitals.  5.  Smoker.  Counseled to stop smoking. Nicotine patch.  6.  Anemia of chronic kidney disease.  Monitor hemoglobin.  Transfuse as needed.  7.  Noncompliance.  Noncompliant with medication, dialysis.  Counseled to take medication regularly and maintain dialysis schedule.  8.  Large ascites. Paracentesis today by IR.        DVT prophylaxis. Lovenox.  Code status. Full code.  Disposition.  May discharge home once dialysis chair arranged.

## 2016-09-14 NOTE — Nurses Notes (Signed)
Pt refused dialysis. States he just wants to go home.  IV removed. Pt denied any complaints.  Trialysis catheter intact.  Pt did take morning medications before leaving.  AMA form signed.  Pt ambulated off floor with backpack and other belongings.

## 2016-09-14 NOTE — Consults (Signed)
PATIENT NAME: [name], BETTS  HOSPITAL NUMBER:  [id number]  DATE OF SERVICE: 09/13/2016  DATE OF BIRTH:  [birth year]    CONSULTATION    NEPHROLOGY CONSULTATION        REASON FOR CONSULT:  End-stage renal disease.    HISTORY OF PRESENTING ILLNESS:  This is a 46 year old African-American male with a past medical history that is significant for anemia of chronic kidney disease, history of end-stage renal disease, dialysis Monday, Wednesday, Friday.  Missed dialysis on Friday.  History of non-compliance, history of ascites,  history of myocardial infarction in the past as well, and has presented this time with a complaint of chest pain that was in the middle of the chest radiating to the left arm and it lasted for a few minutes and then subsided and did have a worsening of lower extremity swelling and mild shortness of breath as well and blood pressure poorly controlled as well.  The patient has been treated for Clostridium difficile before as well.  Had a complaint of fever at home.  No complaint of fever now.  Saw the patient on dialysis.  No complaint of dysuria, hematuria, or frothy urine.  No complaint of nausea, vomiting, and diarrhea now.    PAST MEDICAL HISTORY:  1. End-stage renal disease.  2. History of ascites.  3. Asthma.  4. Hypertension.  5. History of coronary artery disease and myocardial infarction.  6. Non-compliance.  7. Volume overload.  8. Congestive heart failure.    PAST SURGICAL HISTORY:  1. History of arteriovenous fistula placement.  2. History of coronary artery stent placement.  3. History of hernia repair.    HOME MEDICATIONS:  1. Albuterol inhaler.  2. Coreg 25 milligrams twice a day.  3. Furosemide 80 milligrams daily.  4. Hydralazine 100 milligrams three times a day.  5. Sevelamer 800 milligrams three times a day.  6. Vancomycin.  7. Losartan 25 milligrams daily.    8. Oxycodone.    ALLERGIES:  He is allergic to LISINOPRIL, MORPHINE, TORADOL.    SOCIAL HISTORY:  The patient has a history   smoking a quarter pack a day, smoking for a long time.  No history of alcohol or illicit drug abuse.    FAMILY HISTORY:  Noncontributory.    REVIEW OF SYSTEMS:  12-point review of systems is negative besides what is mentioned positive in the history of present illness.    PHYSICAL EXAMINATION:  Morbidly obese, African-American male lying in the bed.  No acute distress.   VITAL SIGNS:  Temperature is 36.3 degrees Celsius, heart rate is 82, respiratory rate 18, saturating 100%.  Blood pressure 165/109.   HEENT:  Pupils reactive.   NECK:  No jugular venous distention.  No thyromegaly.   CHEST:  Clear to auscultation.   HEART:  Sounds are normal.  No rub.  No murmur.   ABDOMEN:  Distended.  Bowel sounds positive.   EXTREMITIES:  2 to 3+ edema.   SKIN:  No rash.   CENTRAL NERVOUS SYSTEM:  The patient is oriented, moving all extremities.    LABORATORY DATA:  White blood cell count 4.3, hemoglobin 7.8, platelet count is 110, and sodium is 138, potassium 5.3, chloride is 102, carbon dioxide 25, BUN is 49, creatinine 10.5, calcium is 6.4.  The last albumin that was done is 3.4.  Corrected calcium is 6.9.    PROBLEM LIST:  1. End-stage renal disease.  2. Hypertension.  3. Hypercalcemia.    ASSESSMENT AND PLAN:  1. Renal.  The patient is having end-stage renal disease, dialysis Monday, Wednesday, Friday.  Missed dialysis on Friday.  Still volume overload.  Got dialysis today.  Will arrange for hemodialysis tomorrow with [facility] as well.  2. Hypocalcemia.  Corrected calcium is around 6.9.  We will get the PTH and 25 hydroxy vitamin D.  Will consider Os-Cal 500 milligrams twice a day.  3. If possible may change Renvela to PhosLo.  4. Fluid restriction 1.5 liters.  5. Hypertension, poorly controlled, non-compliant with medications.  Will recommend to take the blood pressure medication on a regular interval and be more compliant with the fluid restriction.  6. Possibly will need a paracentesis tomorrow.   Thank you very much for  allowing me to participate in the care of the patient.  Will follow the patient tomorrow as well.        Chyrel MassonAnwar Silver Achey, MD                DD:  09/13/2016 13:24:34  DT:  09/14/2016 01:54:49 DJ  D#:  [id number]

## 2016-09-14 NOTE — Care Management Notes (Signed)
SS: ON Call worker for 5/28 and [place] that Dr Ramiro HarvestParikh wanted to discharge patient and [place] and [place]. I advised patient would only get dialysis set up in one place and a consult would need put in for us to work on tomorrow (5/29).

## 2016-09-14 NOTE — Nurses Notes (Signed)
[id number] Patient to IR for paracentesis, a & ox3 via bed from room 227, A. Patient in room 4, showing no signs of distress. Patient denying pain. [facility].   [phone] Procedure discussed with patient including procedural medications with potential side effects,expected length of time and recovery process, application of dressing. Explained potential complications to report i.e. bleeding and signs of infection (fever, redness, wound drainage, increased pain). Consent reviewed and signed. Time allowed to answer any questions. Albumin necessity removed.  [id number] Right middle quadrant prepped with chloroprep by RT(R) and sterile draped applied over site. Time-out performed.   [id number] Patient is now refusing to have paracentesis and would prefer to have it done at [facility] when they get discharged.  0900 Report called to Tammy SoursGreg, RN on 2nd floor.   [id number] Patient transported back to room 227, A via bed in no distress.

## 2016-09-14 NOTE — Care Management Notes (Signed)
I attempted to visit to discuss HD arrangements and Mr [name] is off the floor for a paracentesis.   Nurse Tammy SoursGreg will contact me when he returns to his room.

## 2016-09-14 NOTE — Nurses Notes (Signed)
Patient took off tele monitor.  PCT informed patient of needing to wear it, pt stated "im not, it is just too itchy".  Entered patient's room and offered to switch tele monitor leads to a different area, seen bumps over patient's upper chest and back of what appears to be a rash.  Patient stated what was itching the most was where the bumps were. Dr Bing Quarryebord paged and monitor room informed.

## 2016-09-14 NOTE — Nurses Notes (Signed)
Per IR- pt refused paracentesis.

## 2016-09-21 ENCOUNTER — Emergency Department: Admit: 2016-09-22 | Payer: MEDICARE

## 2016-09-21 DIAGNOSIS — E8770 Fluid overload, unspecified: Secondary | ICD-10-CM

## 2016-09-21 NOTE — ED Notes (Signed)
TRANSFER - IN REPORT:    Verbal report received from [place], [name] (name) on David [name].  Report consisted of patient???s Situation, Background, Assessment and   Recommendations(SBAR).   Information from the following report(s) SBAR and ED Summary was reviewed with the receiving nurse.  Opportunity for questions and clarification was provided.

## 2016-09-21 NOTE — ED Notes (Signed)
Patient left ED, ambulatory.

## 2016-09-21 NOTE — ED Notes (Signed)
X-ray at bedside.

## 2016-09-21 NOTE — ED Triage Notes (Signed)
Patient arrives to ED room via wheelchair with a report of CP since today; N/V/D since Friday. Patient receives dialysis [facility]. Patient reports that he missed yesterday as he wasn't feeling well. Patient reports that he wears 3-4L NC at home.

## 2016-09-21 NOTE — ED Notes (Signed)
Off going bedside report given to Emily, RN.

## 2016-09-21 NOTE — ED Provider Notes (Signed)
EMERGENCY DEPARTMENT HISTORY AND PHYSICAL EXAM      Date: 09/21/2016  Patient Name: David [name]    History of Presenting Illness     Chief Complaint   Patient presents with   ??? Chest Pain     The patient is ambualtory with steady gait, no distress, reports chest pain this morning and abdominal pain and diarrhea. Reports "I have C diff again". Reports pain got worse tonight. Reports missing dialysis yesterday because of feeling too  bad.   ??? Diarrhea       History Provided By: Patient    HPI: David [name], 46 y.o. male well known to this ED with a history of recurrent ED visits following missed dialysis treatments with PMHx significant for CKD / C diff / HTN / ascites / SBP, presents ambulatory to the ED with cc of acute onset of persistent diarrhea and vomiting that has been ongoing for the last couple days but became progressively worse tonight. Pt complains of associated CP x this morning, abdominal pain, and nausea. He also reports a fever of 101 F measured yesterday but denies any fever today. Pt complains of associated SOB and recurrent abdominal distension that typically worsens following missed dialysis treatments. He reports missing his most recent dialysis treatment yesterday secondary to his current symptoms and states that his last treatment was 09/17/16. Pt states that he typically uses 3-4 L O2 at baseline PRN. Pt reports a history of similar diarrhea associated with C diff and expresses concern for the same. He reports smoking 3 cigarettes per day. Pt specifically denies hematemesis or chills.     There are no other complaints, changes, or physical findings at this time.    PCP: Phys Other, MD    Current Facility-Administered Medications   Medication Dose Route Frequency Provider Last Rate Last Dose   ??? oxyCODONE-acetaminophen (PERCOCET) 5-325 mg per tablet 2 Tab  2 Tab Oral ONCE Maxxwell Edgett L. Jun Rightmyer, MD         Current Outpatient Prescriptions   Medication Sig Dispense Refill    ??? carvedilol (COREG) 25 mg tablet Take 25 mg by mouth two (2) times daily (with meals).     ??? pantoprazole (PROTONIX) 20 mg tablet Take 20 mg by mouth daily.     ??? hydrALAZINE (APRESOLINE) 100 mg tablet Take 100 mg by mouth three (3) times daily.     ??? amLODIPine (NORVASC) 10 mg tablet Take 10 mg by mouth daily.     ??? furosemide (LASIX) 80 mg tablet Take 80 mg by mouth daily.     ??? albuterol (PROVENTIL HFA, VENTOLIN HFA, PROAIR HFA) 90 mcg/actuation inhaler Take 2 Puffs by inhalation every four (4) hours as needed for Wheezing.         Past History     Past Medical History:  Past Medical History:   Diagnosis Date   ??? Adverse effect of anesthesia     sleep apnea uses oxygen at night    ??? Ascites    ??? Asthma    ??? C. difficile colitis    ??? CKD (chronic kidney disease) stage V requiring chronic dialysis (HCC)    ??? HTN (hypertension)    ??? SBP (spontaneous bacterial peritonitis) (HCC)    ??? Vomiting 08/20/2016       Past Surgical History:  Past Surgical History:   Procedure Laterality Date   ??? COLONOSCOPY N/A 03/04/2016    COLONOSCOPY performed by Sandy Salaam, MD at [facility]   ???  HX HERNIA REPAIR  2008    right inguinal and umbilical   ??? SIGMOIDOSCOPY,DIAGNOSTIC  03/04/2016            Family History:  Family History   Problem Relation Age of Onset   ??? Hypertension Mother        Social History:  Social History   Substance Use Topics   ??? Smoking status: Current Every Day Smoker     Packs/day: 0.50     Years: 10.00   ??? Smokeless tobacco: Never Used   ??? Alcohol use No       Allergies:  Allergies   Allergen Reactions   ??? Lisinopril Shortness of Breath   ??? Morphine Shortness of Breath   ??? Toradol [Ketorolac] Shortness of Breath         Review of Systems   Review of Systems   Constitutional: Positive for fever. Negative for activity change, appetite change, chills and unexpected weight change.   HENT: Negative for congestion.    Eyes: Negative for pain and visual disturbance.    Respiratory: Positive for shortness of breath. Negative for cough.    Cardiovascular: Positive for chest pain.   Gastrointestinal: Positive for abdominal distention, abdominal pain, diarrhea, nausea and vomiting. Negative for blood in stool.   Genitourinary: Negative for dysuria.   Musculoskeletal: Negative for back pain.   Skin: Negative for rash.   Neurological: Negative for headaches.       Physical Exam   Physical Exam   Constitutional: He is oriented to person, place, and time. He appears well-developed and well-nourished.   Obese elderly male in moderate distress due to anasarca   HENT:   Head: Normocephalic and atraumatic.   Mouth/Throat: Oropharynx is clear and moist.   Eyes: Conjunctivae and EOM are normal. Pupils are equal, round, and reactive to light. Right eye exhibits no discharge. Left eye exhibits no discharge.   Neck: Normal range of motion. Neck supple.   Cardiovascular: Normal rate, regular rhythm and normal heart sounds.    No murmur heard.  Pulmonary/Chest: Effort normal and breath sounds normal. No respiratory distress. He has no wheezes. He has no rales.   Tunneled catheter in L chest  R arm fistula without thrill   Abdominal: Soft. Bowel sounds are normal. There is no tenderness.   Moderate abdominal distension with significant edema of subcutaneous tissue   Musculoskeletal: Normal range of motion.   2+ pitting edema of the bilateral lower extremities   Neurological: He is alert and oriented to person, place, and time. No cranial nerve deficit. He exhibits normal muscle tone.   Skin: Skin is warm and dry. No rash noted. He is not diaphoretic.   Nursing note and vitals reviewed.        Diagnostic Study Results     Labs -     Recent Results (from the past 12 hour(s))   EKG, 12 LEAD, INITIAL    Collection Time: 09/21/16  9:21 PM   Result Value Ref Range    Ventricular Rate 96 BPM    Atrial Rate 96 BPM    P-R Interval 170 ms    QRS Duration 80 ms    Q-T Interval 390 ms     QTC Calculation (Bezet) 492 ms    Calculated P Axis 59 degrees    Calculated R Axis -6 degrees    Calculated T Axis 97 degrees    Diagnosis       Normal sinus rhythm  Possible Left  atrial enlargement  Abnormal QRS-T angle, consider primary T wave abnormality  Prolonged QT  Abnormal ECG  When compared with ECG of 20-Aug-2016 03:55,  No significant change was found     CBC WITH AUTOMATED DIFF    Collection Time: 09/21/16 10:53 PM   Result Value Ref Range    WBC 5.0 4.1 - 11.1 K/uL    RBC 2.28 (L) 4.10 - 5.70 M/uL    HGB 7.2 (L) 12.1 - 17.0 g/dL    HCT 16.123.5 (L) 09.636.6 - 50.3 %    MCV 103.1 (H) 80.0 - 99.0 FL    MCH 31.6 26.0 - 34.0 PG    MCHC 30.6 30.0 - 36.5 g/dL    RDW 04.516.5 (H) 40.911.5 - 14.5 %    PLATELET 120 (L) 150 - 400 K/uL    MPV 10.6 8.9 - 12.9 FL    NRBC 0.0 0 PER 100 WBC    ABSOLUTE NRBC 0.00 0.00 - 0.01 K/uL    NEUTROPHILS 74 32 - 75 %    LYMPHOCYTES 7 (L) 12 - 49 %    MONOCYTES 8 5 - 13 %    EOSINOPHILS 11 (H) 0 - 7 %    BASOPHILS 0 0 - 1 %    IMMATURE GRANULOCYTES 0 0.0 - 0.5 %    ABS. NEUTROPHILS 3.6 1.8 - 8.0 K/UL    ABS. LYMPHOCYTES 0.4 (L) 0.8 - 3.5 K/UL    ABS. MONOCYTES 0.4 0.0 - 1.0 K/UL    ABS. EOSINOPHILS 0.6 (H) 0.0 - 0.4 K/UL    ABS. BASOPHILS 0.0 0.0 - 0.1 K/UL    ABS. IMM. GRANS. 0.0 0.00 - 0.04 K/UL    DF AUTOMATED      RBC COMMENTS ANISOCYTOSIS  1+       METABOLIC PANEL, BASIC    Collection Time: 09/21/16 10:53 PM   Result Value Ref Range    Sodium 140 136 - 145 mmol/L    Potassium 5.2 (H) 3.5 - 5.1 mmol/L    Chloride 105 97 - 108 mmol/L    CO2 25 21 - 32 mmol/L    Anion gap 10 5 - 15 mmol/L    Glucose 84 65 - 100 mg/dL    BUN 45 (H) 6 - 20 MG/DL    Creatinine 81.1910.20 (H) 0.70 - 1.30 MG/DL    BUN/Creatinine ratio 4 (L) 12 - 20      GFR est AA 7 (L) >60 ml/min/1.7373m2    GFR est non-AA 6 (L) >60 ml/min/1.4073m2    Calcium 6.6 (L) 8.5 - 10.1 MG/DL       Radiologic Studies -   XR CHEST PORT   Final Result        CT Results  (Last 48 hours)    None        CXR Results  (Last 48 hours)                09/21/16 2259  XR CHEST PORT Final result    Impression:  IMPRESSION: Stable cardiomegaly. Stable right basilar scarring.           Narrative:  INDICATION: Chest pain.       Portable AP upright view of the chest.       Direct comparison made to prior chest x-ray dated May 2018.       Cardiomediastinal silhouette is stable and enlarged. Dual-lumen central venous   catheter extends to the right atrium, unchanged in position. Vascular stents are  unchanged in position. There is mild right nasal or scarring, unchanged. Lungs   are otherwise clear. No pleural fluid is visualized. There is no pneumothorax.                   Medical Decision Making   I am the first provider for this patient.    I reviewed the vital signs, available nursing notes, past medical history, past surgical history, family history and social history.    Vital Signs-Reviewed the patient's vital signs.  Patient Vitals for the past 12 hrs:   Temp Pulse Resp BP SpO2   09/21/16 2315 - 92 20 (!) 173/113 100 %   09/21/16 2118 98.1 ??F (36.7 ??C) 98 20 (!) 218/133 100 %       Pulse Oximetry Analysis - 100% on RA    Records Reviewed: Nursing Notes, Old Medical Records, Previous electrocardiograms, Previous Radiology Studies, Previous Laboratory Studies and Care Management Plan    Provider Notes (Medical Decision Making):   ESRD pt, afebrile presenting with clinical evidence of fluid overload and symptoms concerning for opiate withdrawal. SBP notable HTN. Patient with care management plan in place due to addiction and medical non-compliance.   -r/o infection  -evaluation for urgent dialysis  -treatment of withdrawal symptoms    ED Course:   Initial assessment performed. The patients presenting problems have been discussed, and they are in agreement with the care plan formulated and outlined with them.  I have encouraged them to ask questions as they arise throughout their visit.    Medications    oxyCODONE-acetaminophen (PERCOCET) 5-325 mg per tablet 2 Tab (not administered)   ondansetron (ZOFRAN) injection 8 mg (8 mg IntraVENous Given 09/21/16 2316)   albuterol-ipratropium (DUO-NEB) 2.5 MG-0.5 MG/3 ML (3 mL Nebulization Given 09/21/16 2316)     Progress Note:  10:08 PM  Pt's Case Management Plan has been reviewed; pt has a history of similar ED visits with drug-seeking behavior; tends to elope when not receiving desired care.  Written by Tempie Donning, ED Scribe, as dictated by Brooks Sailors, MD.    Tobacco Counseling  Discussed the risks of smoking and the benefits of smoking cessation as well as the long term sequelae of smoking with the patient. The patient verbalized their understanding.     Progress Note:  11:09 PM  Per nursing, pt is requesting pain medication. Will dose with Percocet in the ED but pt known he will not receive an rx today per pt's case management plan.   Written by Tempie Donning, ED Scribe, as dictated by Brooks Sailors, MD.    Progress Note:  11:37 PM  Per nursing, pt has stated that he wants to leave the ED as he is not going to be given IV narcotics, has specifically requested Dilaudid. Will attempt to discuss with pt before he leaves the ED and request he stay for complete evaluation.  Written by Tempie Donning, ED Scribe, as dictated by Brooks Sailors, MD.    Progress Note:  11:45 PM  Attempted to discuss with pt and convince him to stay in the ED to complete his evaluation and treatment. He has removed himself from monitor and eloped from the ED; unable to find the patient on hospital property.   Written by Tempie Donning, ED Scribe, as dictated by Brooks Sailors, MD.    00:15 Results reviewed without any evidence of severe dysfunction requiring urgent dialysis.     Critical Care  Time:   0    Disposition:  11:45 PM  Pt has eloped from the ED at this time prior to completion of his treatment despite nursing advising him not to and before the risks  associated with doing so could be explained to him by myself.     PLAN:  1.   Discharge Medication List as of 09/22/2016 12:06 AM        2.   Follow-up Information     None        Return to ED if worse     Diagnosis     Clinical Impression:   1. Other hypervolemia    2. Non compliance w medication regimen    3. Opioid dependence with opioid-induced disorder (HCC)    4. Diarrhea, unspecified type        Attestations:    This note is prepared by Scarlette Slice, acting as Scribe for Brooks Sailors, MD.    The scribe's documentation has been prepared under my direction and personally reviewed by me in its entirety. I confirm that the note above accurately reflects all work, treatment, procedures, and medical decision making performed by me.  Brooks Sailors, MD

## 2016-09-21 NOTE — ED Notes (Addendum)
Patient called RN, requested to leave ED as he is not receiving IV narcotics requested.  Advised Dr. Claudell Kyleermeer who stated that patient may leave and would be AMA.  Dr. Claudell Kyleermeer attempted to make patient aware.  Patient disconnected from monitor and IV removed prior to MD seeing patient.

## 2016-09-22 ENCOUNTER — Inpatient Hospital Stay
Admit: 2016-09-22 | Discharge: 2016-09-22 | Discharge status: Against Medical Advice | Payer: MEDICARE | Attending: Emergency Medicine

## 2016-09-22 LAB — METABOLIC PANEL, BASIC
Anion gap: 10 mmol/L (ref 5–15)
BUN/Creatinine ratio: 4 — ABNORMAL LOW (ref 12–20)
BUN: 45 MG/DL — ABNORMAL HIGH (ref 6–20)
CO2: 25 mmol/L (ref 21–32)
Calcium: 6.6 MG/DL — ABNORMAL LOW (ref 8.5–10.1)
Chloride: 105 mmol/L (ref 97–108)
Creatinine: 10.2 MG/DL — ABNORMAL HIGH (ref 0.70–1.30)
GFR est AA: 7 mL/min/{1.73_m2} — ABNORMAL LOW (ref >60)
GFR est non-AA: 6 mL/min/{1.73_m2} — ABNORMAL LOW (ref >60)
Glucose: 84 mg/dL (ref 65–100)
Potassium: 5.2 mmol/L — ABNORMAL HIGH (ref 3.5–5.1)
Sodium: 140 mmol/L (ref 136–145)

## 2016-09-22 LAB — CBC WITH AUTOMATED DIFF
ABS. BASOPHILS: 0 10*3/uL (ref 0.0–0.1)
ABS. EOSINOPHILS: 0.6 10*3/uL — ABNORMAL HIGH (ref 0.0–0.4)
ABS. IMM. GRANS.: 0 10*3/uL (ref 0.00–0.04)
ABS. LYMPHOCYTES: 0.4 10*3/uL — ABNORMAL LOW (ref 0.8–3.5)
ABS. MONOCYTES: 0.4 10*3/uL (ref 0.0–1.0)
ABS. NEUTROPHILS: 3.6 10*3/uL (ref 1.8–8.0)
ABSOLUTE NRBC: 0 10*3/uL (ref 0.00–0.01)
BASOPHILS: 0 % (ref 0–1)
EOSINOPHILS: 11 % — ABNORMAL HIGH (ref 0–7)
HCT: 23.5 % — ABNORMAL LOW (ref 36.6–50.3)
HGB: 7.2 g/dL — ABNORMAL LOW (ref 12.1–17.0)
IMMATURE GRANULOCYTES: 0 % (ref 0.0–0.5)
LYMPHOCYTES: 7 % — ABNORMAL LOW (ref 12–49)
MCH: 31.6 PG (ref 26.0–34.0)
MCHC: 30.6 g/dL (ref 30.0–36.5)
MCV: 103.1 FL — ABNORMAL HIGH (ref 80.0–99.0)
MONOCYTES: 8 % (ref 5–13)
MPV: 10.6 FL (ref 8.9–12.9)
NEUTROPHILS: 74 % (ref 32–75)
NRBC: 0 PER 100 WBC
PLATELET: 120 10*3/uL — ABNORMAL LOW (ref 150–400)
RBC: 2.28 M/uL — ABNORMAL LOW (ref 4.10–5.70)
RDW: 16.5 % — ABNORMAL HIGH (ref 11.5–14.5)
WBC: 5 10*3/uL (ref 4.1–11.1)

## 2016-09-22 LAB — EKG, 12 LEAD, INITIAL
Atrial Rate: 96 {beats}/min
Calculated P Axis: 59 degrees
Calculated R Axis: -6 degrees
Calculated T Axis: 97 degrees
Diagnosis: NORMAL
P-R Interval: 170 ms
Q-T Interval: 390 ms
QRS Duration: 80 ms
QTC Calculation (Bezet): 492 ms
Ventricular Rate: 96 {beats}/min

## 2016-09-22 LAB — EKG 12-LEAD
Atrial Rate: 96 {beats}/min
Diagnosis: NORMAL
P Axis: 59 degrees
P-R Interval: 170 ms
Q-T Interval: 390 ms
QRS Duration: 80 ms
QTc Calculation (Bazett): 492 ms
R Axis: -6 degrees
T Axis: 97 degrees
Ventricular Rate: 96 {beats}/min

## 2016-09-22 MED ORDER — ONDANSETRON (PF) 4 MG/2 ML INJECTION
4 mg/2 mL | INTRAMUSCULAR | Status: AC
Start: 2016-09-22 — End: 2016-09-21
  Administered 2016-09-22: 03:00:00 via INTRAVENOUS

## 2016-09-22 MED ORDER — OXYCODONE-ACETAMINOPHEN 5 MG-325 MG TAB
5-325 mg | Freq: Once | ORAL | Status: DC
Start: 2016-09-22 — End: 2016-09-22

## 2016-09-22 MED ORDER — IPRATROPIUM-ALBUTEROL 2.5 MG-0.5 MG/3 ML NEB SOLUTION
2.5 mg-0.5 mg/3 ml | Freq: Once | RESPIRATORY_TRACT | Status: AC
Start: 2016-09-22 — End: 2016-09-21
  Administered 2016-09-22: 03:00:00 via RESPIRATORY_TRACT

## 2016-09-22 MED FILL — IPRATROPIUM-ALBUTEROL 2.5 MG-0.5 MG/3 ML NEB SOLUTION: 2.5 mg-0.5 mg/3 ml | RESPIRATORY_TRACT | Qty: 3

## 2016-09-22 MED FILL — ONDANSETRON (PF) 4 MG/2 ML INJECTION: 4 mg/2 mL | INTRAMUSCULAR | Qty: 4

## 2016-10-02 ENCOUNTER — Emergency Department (HOSPITAL_COMMUNITY): Payer: Self-pay | Admitting: Emergency Medicine

## 2016-10-02 ENCOUNTER — Inpatient Hospital Stay (EMERGENCY_DEPARTMENT_HOSPITAL)
Admission: EM | Admit: 2016-10-02 | Discharge: 2016-10-02 | Disposition: A | Discharge status: Home / Self Care | Payer: Medicare Other

## 2016-10-02 DIAGNOSIS — R1030 Lower abdominal pain, unspecified: Secondary | ICD-10-CM

## 2016-10-02 DIAGNOSIS — R000 Tachycardia, unspecified: Secondary | ICD-10-CM

## 2016-10-02 DIAGNOSIS — N289 Disorder of kidney and ureter, unspecified: Secondary | ICD-10-CM

## 2016-10-03 NOTE — Discharge Summary (Signed)
Patient admitted on 09/12/2016 for chest pain, ESRD on hemodialysis, recurrent C diff colitis, hypertension.  He is very noncompliant with dialysis and medical therapy.  He was admitted and treated for above diagnosis.  Ruled out for ACS.  Chest pain free in hospital.  Treated with oral vancomycin for C diff infection.  Diarrhea improved.  Was supposed to get further dialysis and paracentesis for his ascites.  For unclear reason, he chose to leave AMA on 09/14/2016.  Patient left AMA.

## 2016-10-04 ENCOUNTER — Emergency Department (EMERGENCY_DEPARTMENT_HOSPITAL): Payer: Medicare Other

## 2016-10-04 ENCOUNTER — Inpatient Hospital Stay (HOSPITAL_COMMUNITY): Payer: Medicare Other

## 2016-10-04 ENCOUNTER — Inpatient Hospital Stay (HOSPITAL_COMMUNITY): Payer: Medicare Other | Admitting: Nephrology

## 2016-10-04 ENCOUNTER — Encounter (HOSPITAL_COMMUNITY): Payer: Self-pay

## 2016-10-04 ENCOUNTER — Inpatient Hospital Stay
Admission: EM | Admit: 2016-10-04 | Discharge: 2016-10-05 | DRG: 302 | Discharge status: Against Medical Advice | Payer: Medicare Other | Attending: Internal Medicine | Admitting: Internal Medicine

## 2016-10-04 DIAGNOSIS — R0600 Dyspnea, unspecified: Secondary | ICD-10-CM

## 2016-10-04 DIAGNOSIS — R9431 Abnormal electrocardiogram [ECG] [EKG]: Secondary | ICD-10-CM

## 2016-10-04 DIAGNOSIS — Z886 Allergy status to analgesic agent status: Secondary | ICD-10-CM

## 2016-10-04 DIAGNOSIS — R34 Anuria and oliguria: Secondary | ICD-10-CM | POA: Diagnosis present

## 2016-10-04 DIAGNOSIS — I25118 Atherosclerotic heart disease of native coronary artery with other forms of angina pectoris: Principal | ICD-10-CM | POA: Diagnosis present

## 2016-10-04 DIAGNOSIS — K766 Portal hypertension: Secondary | ICD-10-CM | POA: Diagnosis present

## 2016-10-04 DIAGNOSIS — I4581 Long QT syndrome: Secondary | ICD-10-CM

## 2016-10-04 DIAGNOSIS — Z992 Dependence on renal dialysis: Secondary | ICD-10-CM

## 2016-10-04 DIAGNOSIS — R079 Chest pain, unspecified: Secondary | ICD-10-CM

## 2016-10-04 DIAGNOSIS — E875 Hyperkalemia: Secondary | ICD-10-CM | POA: Diagnosis present

## 2016-10-04 DIAGNOSIS — A0471 Enterocolitis due to Clostridium difficile, recurrent: Secondary | ICD-10-CM | POA: Diagnosis present

## 2016-10-04 DIAGNOSIS — Z955 Presence of coronary angioplasty implant and graft: Secondary | ICD-10-CM

## 2016-10-04 DIAGNOSIS — N186 End stage renal disease: Secondary | ICD-10-CM

## 2016-10-04 DIAGNOSIS — F17210 Nicotine dependence, cigarettes, uncomplicated: Secondary | ICD-10-CM | POA: Diagnosis present

## 2016-10-04 DIAGNOSIS — E162 Hypoglycemia, unspecified: Secondary | ICD-10-CM | POA: Diagnosis present

## 2016-10-04 DIAGNOSIS — R160 Hepatomegaly, not elsewhere classified: Secondary | ICD-10-CM | POA: Diagnosis present

## 2016-10-04 DIAGNOSIS — R188 Other ascites: Secondary | ICD-10-CM

## 2016-10-04 DIAGNOSIS — Z885 Allergy status to narcotic agent status: Secondary | ICD-10-CM

## 2016-10-04 DIAGNOSIS — I2510 Atherosclerotic heart disease of native coronary artery without angina pectoris: Secondary | ICD-10-CM

## 2016-10-04 DIAGNOSIS — D6959 Other secondary thrombocytopenia: Secondary | ICD-10-CM | POA: Diagnosis not present

## 2016-10-04 DIAGNOSIS — J45909 Unspecified asthma, uncomplicated: Secondary | ICD-10-CM | POA: Diagnosis present

## 2016-10-04 DIAGNOSIS — K529 Noninfective gastroenteritis and colitis, unspecified: Secondary | ICD-10-CM

## 2016-10-04 DIAGNOSIS — Z888 Allergy status to other drugs, medicaments and biological substances status: Secondary | ICD-10-CM

## 2016-10-04 DIAGNOSIS — Z9115 Patient's noncompliance with renal dialysis: Secondary | ICD-10-CM

## 2016-10-04 DIAGNOSIS — I120 Hypertensive chronic kidney disease with stage 5 chronic kidney disease or end stage renal disease: Secondary | ICD-10-CM | POA: Diagnosis present

## 2016-10-04 DIAGNOSIS — D631 Anemia in chronic kidney disease: Secondary | ICD-10-CM | POA: Diagnosis present

## 2016-10-04 DIAGNOSIS — K7460 Unspecified cirrhosis of liver: Secondary | ICD-10-CM | POA: Diagnosis present

## 2016-10-04 DIAGNOSIS — Z79899 Other long term (current) drug therapy: Secondary | ICD-10-CM

## 2016-10-04 DIAGNOSIS — I517 Cardiomegaly: Secondary | ICD-10-CM

## 2016-10-04 DIAGNOSIS — I252 Old myocardial infarction: Secondary | ICD-10-CM

## 2016-10-04 LAB — THYROID STIMULATING HORMONE WITH FREE T4 REFLEX: TSH: 3.104 u[IU]/mL (ref 0.350–5.000)

## 2016-10-04 LAB — CBC WITH DIFF
BASOPHIL #: 0.03 x10ˆ3/uL (ref 0.00–0.20)
BASOPHIL %: 1 %
EOSINOPHIL #: 0.45 x10?3/uL (ref 0.00–0.50)
EOSINOPHIL %: 8 %
HCT: 24.6 % — ABNORMAL LOW (ref 36.7–47.0)
HGB: 8.1 g/dL — ABNORMAL LOW (ref 12.5–16.3)
LYMPHOCYTE #: 0.28 x10?3/uL — ABNORMAL LOW (ref 1.00–4.80)
LYMPHOCYTE %: 5 %
MCH: 31.4 pg (ref 27.4–33.0)
MCHC: 33.1 g/dL (ref 32.5–35.8)
MCV: 94.8 fL (ref 78.0–100.0)
MONOCYTE #: 0.35 x10ˆ3/uL (ref 0.30–1.00)
MONOCYTE %: 7 %
MPV: 7.6 fL (ref 7.5–11.5)
NEUTROPHIL #: 4.34 x10ˆ3/uL (ref 1.50–7.70)
NEUTROPHIL %: 80 %
PLATELETS: 107 x10ˆ3/uL — ABNORMAL LOW (ref 140–450)
RBC: 2.59 x10ˆ6/uL — ABNORMAL LOW (ref 4.06–5.63)
RDW: 17.1 % — ABNORMAL HIGH (ref 12.0–15.0)
WBC: 5.4 x10ˆ3/uL (ref 3.5–11.0)

## 2016-10-04 LAB — BASIC METABOLIC PANEL
ANION GAP: 12 mmol/L (ref 4–13)
ANION GAP: 13 mmol/L (ref 4–13)
BUN/CREA RATIO: 5 — ABNORMAL LOW (ref 6–22)
BUN/CREA RATIO: 5 — ABNORMAL LOW (ref 6–22)
BUN: 47 mg/dL — ABNORMAL HIGH (ref 8–25)
BUN: 49 mg/dL — ABNORMAL HIGH (ref 8–25)
CALCIUM: 7.2 mg/dL — ABNORMAL LOW (ref 8.5–10.2)
CALCIUM: 7.1 mg/dL — ABNORMAL LOW (ref 8.5–10.2)
CHLORIDE: 105 mmol/L (ref 96–111)
CHLORIDE: 102 mmol/L (ref 96–111)
CO2 TOTAL: 22 mmol/L (ref 22–32)
CO2 TOTAL: 22 mmol/L (ref 22–32)
CREATININE: 8.89 mg/dL — ABNORMAL HIGH (ref 0.62–1.27)
CREATININE: 9.12 mg/dL — ABNORMAL HIGH (ref 0.62–1.27)
ESTIMATED GFR: 8 mL/min/1.73mˆ2 — ABNORMAL LOW (ref >59)
ESTIMATED GFR: 8 mL/min/{1.73_m2} — ABNORMAL LOW (ref >59)
GLUCOSE: 76 mg/dL (ref 65–139)
GLUCOSE: 69 mg/dL (ref 65–139)
POTASSIUM: 6.1 mmol/L (ref 3.5–5.1)
POTASSIUM: 5.8 mmol/L — ABNORMAL HIGH (ref 3.5–5.1)
SODIUM: 139 mmol/L (ref 136–145)
SODIUM: 137 mmol/L (ref 136–145)

## 2016-10-04 LAB — PHOSPHORUS
PHOSPHORUS: 5.9 mg/dL — ABNORMAL HIGH (ref 2.4–4.7)
PHOSPHORUS: 6 mg/dL — ABNORMAL HIGH (ref 2.4–4.7)

## 2016-10-04 LAB — HEPATIC FUNCTION PANEL
ALBUMIN: 3.1 g/dL — ABNORMAL LOW (ref 3.5–5.0)
ALKALINE PHOSPHATASE: 261 U/L — ABNORMAL HIGH (ref <150)
ALKALINE PHOSPHATASE: 261 U/L — ABNORMAL HIGH (ref <150)
ALT (SGPT): 15 U/L (ref <55)
BILIRUBIN DIRECT: 0.2 mg/dL (ref <0.3)
BILIRUBIN TOTAL: 0.7 mg/dL (ref 0.3–1.3)
PROTEIN TOTAL: 6.5 g/dL (ref 6.4–8.3)

## 2016-10-04 LAB — B-TYPE NATRIURETIC PEPTIDE (BNP),PLASMA: BNP: 2738 pg/mL — ABNORMAL HIGH (ref <=100)

## 2016-10-04 LAB — VENOUS BLOOD GAS/LACTATE/CO-OX/LYTES (NA/K/CA/CL/GLUC) - ORS ONLY
BASE DEFICIT: 0.8 mmol/L (ref ?–3.0)
BICARBONATE (VENOUS): 24 mmol/L (ref 22.0–26.0)
CARBOXYHEMOGLOBIN: 4.8 % — ABNORMAL HIGH (ref 0.0–2.5)
CHLORIDE: 104 mmol/L (ref 96–111)
GLUCOSE: 88 mg/dL (ref 60–105)
HEMOGLOBIN: 7.8 g/dL — ABNORMAL LOW (ref 12.0–18.0)
IONIZED CALCIUM: 0.86 mmol/L — ABNORMAL LOW (ref 1.10–1.36)
LACTATE: 1 mmol/L (ref 0.0–1.3)
MET-HEMOGLOBIN: 1.3 % (ref 0.0–3.5)
O2CT: 8.2 % (ref 6.7–17.5)
OXYHEMOGLOBIN: 74.8 % (ref 40.0–80.0)
PCO2 (VENOUS): 45 mmHg (ref 41.00–51.00)
PH (VENOUS): 7.35 (ref 7.31–7.41)
PO2 (VENOUS): 40 mmHg (ref 35.0–50.0)
SODIUM: 135 mmol/L — ABNORMAL LOW (ref 136–145)
WHOLE BLOOD POTASSIUM: 5.9 mmol/L — ABNORMAL HIGH (ref 3.5–5.0)

## 2016-10-04 LAB — LIPASE: LIPASE: 175 U/L — ABNORMAL HIGH (ref 10–80)

## 2016-10-04 LAB — VENOUS BLOOD GAS/LACTATE/CO-OX/LYTES (NA/K/CA/CL/GLUC)
%FIO2 (VENOUS): 32 %
IONIZED CALCIUM: 0.86 mmol/L — ABNORMAL LOW (ref 1.10–1.36)
SODIUM: 135 mmol/L — ABNORMAL LOW (ref 136–145)

## 2016-10-04 LAB — PT/INR
INR: 1.13 (ref 0.80–1.20)
PROTHROMBIN TIME: 13.1 s (ref 9.3–13.9)

## 2016-10-04 LAB — POTASSIUM: POTASSIUM: 5.1 mmol/L (ref 3.5–5.1)

## 2016-10-04 LAB — MAGNESIUM
MAGNESIUM: 2.3 mg/dL (ref 1.6–2.5)
MAGNESIUM: 2 mg/dL (ref 1.6–2.5)

## 2016-10-04 LAB — PTT (PARTIAL THROMBOPLASTIN TIME): APTT: 36.1 s (ref 25.1–36.5)

## 2016-10-04 LAB — POC BLOOD GLUCOSE (RESULTS)
GLUCOSE, POC: 303 mg/dL — ABNORMAL HIGH (ref 70–105)
GLUCOSE, POC: 34 mg/dL — CL (ref 70–105)
GLUCOSE, POC: 77 mg/dL (ref 70–105)
GLUCOSE, POC: 150 mg/dL — ABNORMAL HIGH (ref 70–105)
GLUCOSE, POC: 60 mg/dL — ABNORMAL LOW (ref 70–105)
GLUCOSE, POC: 95 mg/dL (ref 70–105)

## 2016-10-04 LAB — TROPONIN-I (FOR ED ONLY)
TROPONIN I: 65 ng/L (ref 0–30)
TROPONIN I: 80 ng/L (ref 0–30)

## 2016-10-04 LAB — HIV1/HIV2 SCREEN, COMBINED ANTIGEN AND ANTIBODY: HIV SCREEN, COMBINED ANTIGEN & ANTIBODY: NEGATIVE

## 2016-10-04 MED ORDER — CEFEPIME 2 GRAM/100 ML IN DEXTROSE PREMIX IVPB - EXTENDED INFUSION
2.0000 g | INJECTION | Freq: Once | INTRAVENOUS | Status: DC
Start: 2016-10-04 — End: 2016-10-04

## 2016-10-04 MED ORDER — HYDRALAZINE 50 MG TABLET
100.00 mg | ORAL_TABLET | Freq: Three times a day (TID) | ORAL | Status: DC
Start: 2016-10-05 — End: 2016-10-06
  Administered 2016-10-05 (×2): 100 mg via ORAL
  Administered 2016-10-05: 0 mg via ORAL
  Administered 2016-10-05: 100 mg via ORAL
  Administered 2016-10-05 (×2): 0 mg via ORAL
  Filled 2016-10-04 (×7): qty 2

## 2016-10-04 MED ORDER — IOVERSOL 350 MG IODINE/ML INTRAVENOUS SOLUTION
100.00 mL | INTRAVENOUS | Status: AC
Start: 2016-10-04 — End: 2016-10-04
  Administered 2016-10-04: 100 mL via INTRAVENOUS

## 2016-10-04 MED ORDER — HYDROMORPHONE 2 MG/ML INJECTION SYRINGE
0.20 mg | INJECTION | INTRAMUSCULAR | Status: AC
Start: 2016-10-04 — End: 2016-10-04

## 2016-10-04 MED ORDER — DEXTROSE 50 % IN WATER (D50W) INTRAVENOUS SYRINGE
25.00 g | INJECTION | INTRAVENOUS | Status: AC
Start: 2016-10-04 — End: 2016-10-04
  Administered 2016-10-04: 50 mL via INTRAVENOUS

## 2016-10-04 MED ORDER — HYDROMORPHONE 2 MG/ML INJECTION SYRINGE
0.2000 mg | INJECTION | INTRAMUSCULAR | Status: AC
Start: 2016-10-04 — End: 2016-10-04

## 2016-10-04 MED ORDER — SODIUM CHLORIDE 0.9 % (FLUSH) INJECTION SYRINGE
2.0000 mL | INJECTION | Freq: Three times a day (TID) | INTRAMUSCULAR | Status: DC
Start: 2016-10-04 — End: 2016-10-06
  Administered 2016-10-04 – 2016-10-05 (×2): 0 mL
  Administered 2016-10-05 (×2): 2 mL

## 2016-10-04 MED ORDER — SEVELAMER CARBONATE 800 MG TABLET
800.00 mg | ORAL_TABLET | Freq: Three times a day (TID) | ORAL | Status: DC
Start: 2016-10-05 — End: 2016-10-06
  Administered 2016-10-05 (×3): 800 mg via ORAL
  Filled 2016-10-04 (×6): qty 1

## 2016-10-04 MED ORDER — VANCOMYCIN 10 GRAM INTRAVENOUS SOLUTION
18.00 mg/kg | INTRAVENOUS | Status: AC
Start: 2016-10-05 — End: 2016-10-05
  Administered 2016-10-05: 0 mg via INTRAVENOUS
  Administered 2016-10-05: 1750 mg via INTRAVENOUS
  Filled 2016-10-04: qty 17.5

## 2016-10-04 MED ORDER — SODIUM CHLORIDE 0.9 % (FLUSH) INJECTION SYRINGE
2.00 mL | INJECTION | INTRAMUSCULAR | Status: DC | PRN
Start: 2016-10-04 — End: 2016-10-06

## 2016-10-04 MED ORDER — SODIUM CHLORIDE 0.9 % INTRAVENOUS SOLUTION
1.0000 g | Freq: Once | INTRAVENOUS | Status: AC
Start: 2016-10-05 — End: 2016-10-05
  Administered 2016-10-05: 0 g via INTRAVENOUS
  Filled 2016-10-04: qty 10

## 2016-10-04 MED ORDER — SODIUM CHLORIDE 0.9 % (FLUSH) INJECTION SYRINGE
2.0000 mL | INJECTION | INTRAMUSCULAR | Status: DC | PRN
Start: 2016-10-04 — End: 2016-10-06

## 2016-10-04 MED ORDER — HYDROMORPHONE 2 MG/ML INJECTION SYRINGE
INJECTION | INTRAMUSCULAR | Status: AC
Start: 2016-10-04 — End: 2016-10-05
  Administered 2016-10-05: 0.4 mg via INTRAVENOUS
  Filled 2016-10-04: qty 1

## 2016-10-04 MED ORDER — SODIUM CHLORIDE 0.9 % (FLUSH) INJECTION SYRINGE
2.00 mL | INJECTION | Freq: Three times a day (TID) | INTRAMUSCULAR | Status: DC
Start: 2016-10-04 — End: 2016-10-06
  Administered 2016-10-04: 0 mL
  Administered 2016-10-05 (×2): 2 mL
  Administered 2016-10-05: 0 mL

## 2016-10-04 MED ORDER — PERFLUTREN LIPID MICROSPHERES 1.1 MG/ML INTRAVENOUS SUSPENSION
0.30 mL | INTRAVENOUS | Status: DC
Start: 2016-10-05 — End: 2016-10-06

## 2016-10-04 MED ORDER — ASPIRIN 81 MG CHEWABLE TABLET
CHEWABLE_TABLET | ORAL | Status: AC
Start: 2016-10-04 — End: 2016-10-04
  Administered 2016-10-04: 324 mg via ORAL
  Filled 2016-10-04: qty 4

## 2016-10-04 MED ORDER — INSULIN U-100 REGULAR HUMAN 100 UNIT/ML INJECTION SOLUTION
10.00 [IU] | INTRAMUSCULAR | Status: AC
Start: 2016-10-04 — End: 2016-10-04
  Administered 2016-10-04: 10 [IU] via INTRAVENOUS

## 2016-10-04 MED ORDER — SODIUM BICARBONATE 8.4 % (1 MEQ/ML) INTRAVENOUS SYRINGE
50.0000 meq | INJECTION | INTRAVENOUS | Status: AC
Start: 2016-10-04 — End: 2016-10-04
  Administered 2016-10-04 (×2): 50 meq via INTRAVENOUS
  Filled 2016-10-04: qty 50

## 2016-10-04 MED ORDER — VANCOMYCIN 10 GRAM INTRAVENOUS SOLUTION
20.0000 mg/kg | Freq: Once | INTRAVENOUS | Status: AC
Start: 2016-10-05 — End: 2016-10-05
  Administered 2016-10-05: 0 mg via INTRAVENOUS
  Administered 2016-10-05: 1750 mg via INTRAVENOUS
  Filled 2016-10-04: qty 18

## 2016-10-04 MED ORDER — HYDROMORPHONE 2 MG/ML INJECTION SYRINGE
0.4000 mg | INJECTION | INTRAMUSCULAR | Status: DC | PRN
Start: 2016-10-04 — End: 2016-10-05
  Administered 2016-10-04 – 2016-10-05 (×6): 0.4 mg via INTRAVENOUS
  Filled 2016-10-04 (×6): qty 1

## 2016-10-04 MED ORDER — ONDANSETRON HCL (PF) 4 MG/2 ML INJECTION SOLUTION
4.00 mg | INTRAMUSCULAR | Status: AC
Start: 2016-10-04 — End: 2016-10-04
  Administered 2016-10-04 (×2): 4 mg via INTRAVENOUS
  Filled 2016-10-04: qty 2

## 2016-10-04 MED ORDER — TRIMETHOBENZAMIDE 100 MG/ML INTRAMUSCULAR SOLUTION
200.00 mg | Freq: Four times a day (QID) | INTRAMUSCULAR | Status: DC | PRN
Start: 2016-10-04 — End: 2016-10-06
  Filled 2016-10-04: qty 2

## 2016-10-04 MED ORDER — HYDROMORPHONE 2 MG/ML INJECTION SYRINGE
0.40 mg | INJECTION | INTRAMUSCULAR | Status: AC
Start: 2016-10-04 — End: 2016-10-04
  Administered 2016-10-04: 0.4 mg via INTRAVENOUS
  Filled 2016-10-04: qty 1

## 2016-10-04 MED ORDER — DEXTROSE 50 % IN WATER (D50W) INTRAVENOUS SYRINGE
25.00 g | INJECTION | INTRAVENOUS | Status: AC
Start: 2016-10-04 — End: 2016-10-04

## 2016-10-04 MED ORDER — NITROGLYCERIN 0.4 MG SUBLINGUAL TABLET
0.4000 mg | SUBLINGUAL_TABLET | SUBLINGUAL | Status: DC | PRN
Start: 2016-10-04 — End: 2016-10-06

## 2016-10-04 MED ORDER — ASPIRIN 81 MG CHEWABLE TABLET
324.0000 mg | CHEWABLE_TABLET | ORAL | Status: AC
Start: 2016-10-04 — End: 2016-10-04

## 2016-10-04 MED ORDER — ALBUTEROL SULFATE 2.5 MG/3 ML (0.083 %) SOLUTION FOR NEBULIZATION
2.5000 mg | INHALATION_SOLUTION | RESPIRATORY_TRACT | Status: DC | PRN
Start: 2016-10-04 — End: 2016-10-06
  Administered 2016-10-05: 2.5 mg via RESPIRATORY_TRACT

## 2016-10-04 MED ORDER — CEFEPIME 2 GRAM/100 ML IN DEXTROSE PREMIX IVPB - EXTENDED INFUSION
2.00 g | INJECTION | INTRAVENOUS | Status: AC
Start: 2016-10-05 — End: 2016-10-05
  Administered 2016-10-05: 2 g via INTRAVENOUS
  Filled 2016-10-04: qty 100

## 2016-10-04 MED ORDER — DEXTROSE 50 % IN WATER (D50W) INTRAVENOUS SOLUTION
INTRAVENOUS | Status: AC
Start: 2016-10-04 — End: 2016-10-04
  Administered 2016-10-04: 50 mL via INTRAVENOUS
  Filled 2016-10-04: qty 100

## 2016-10-04 MED ORDER — DEXTROSE 50 % IN WATER (D50W) INTRAVENOUS SYRINGE
50.00 g | INJECTION | INTRAVENOUS | Status: AC
Start: 2016-10-04 — End: 2016-10-04
  Administered 2016-10-04: 100 mL via INTRAVENOUS
  Filled 2016-10-04: qty 100

## 2016-10-04 MED ORDER — NITROGLYCERIN 0.4 MG SUBLINGUAL TABLET
SUBLINGUAL_TABLET | SUBLINGUAL | Status: AC
Start: 2016-10-04 — End: 2016-10-04
  Administered 2016-10-04: 0.4 mg via SUBLINGUAL
  Filled 2016-10-04: qty 25

## 2016-10-04 MED ORDER — VANCOMYCIN 50 MG/ML ORAL LIQUID WITH FLAVORING
125.00 mg | Freq: Four times a day (QID) | ORAL | Status: DC
Start: 2016-10-05 — End: 2016-10-06
  Administered 2016-10-05: 0 mg via ORAL
  Administered 2016-10-05 (×3): 125 mg via ORAL
  Filled 2016-10-04 (×10): qty 2.5

## 2016-10-04 MED ORDER — ALBUTEROL SULFATE 2.5 MG/3 ML (0.083 %) SOLUTION FOR NEBULIZATION
2.50 mg | INHALATION_SOLUTION | RESPIRATORY_TRACT | Status: AC
Start: 2016-10-04 — End: 2016-10-04
  Filled 2016-10-04: qty 3

## 2016-10-04 MED ORDER — VANCOMYCIN IV - PHARMACIST TO DOSE PER PROTOCOL
Freq: Every day | Status: DC | PRN
Start: 2016-10-04 — End: 2016-10-05

## 2016-10-04 MED ORDER — HYDROMORPHONE 2 MG/ML INJECTION SYRINGE
INJECTION | INTRAMUSCULAR | Status: AC
Start: 2016-10-04 — End: 2016-10-04
  Administered 2016-10-04: 0.2 mg via INTRAVENOUS
  Filled 2016-10-04: qty 1

## 2016-10-04 MED ORDER — VANCOMYCIN 10 GRAM INTRAVENOUS SOLUTION
15.0000 mg/kg | Freq: Once | INTRAVENOUS | Status: DC
Start: 2016-10-04 — End: 2016-10-04

## 2016-10-04 MED ORDER — HYDROMORPHONE 2 MG/ML INJECTION SYRINGE
0.4000 mg | INJECTION | INTRAMUSCULAR | Status: AC
Start: 2016-10-04 — End: 2016-10-04
  Administered 2016-10-04: 0.4 mg via INTRAVENOUS
  Filled 2016-10-04: qty 1

## 2016-10-04 MED ORDER — VANCOMYCIN IV - PHARMACIST TO DOSE PER PROTOCOL
Freq: Every day | Status: DC | PRN
Start: 2016-10-04 — End: 2016-10-06

## 2016-10-04 MED ORDER — CARVEDILOL 25 MG TABLET
25.00 mg | ORAL_TABLET | Freq: Two times a day (BID) | ORAL | Status: DC
Start: 2016-10-05 — End: 2016-10-06
  Administered 2016-10-05: 0 mg via ORAL
  Administered 2016-10-05: 25 mg via ORAL
  Filled 2016-10-04 (×4): qty 1

## 2016-10-04 MED ADMIN — alcohol 62% nasal solution: RESPIRATORY_TRACT | @ 19:00:00

## 2016-10-04 MED ADMIN — dextrose 50 % in water (D50W) intravenous syringe: INTRAVENOUS | @ 21:00:00

## 2016-10-04 MED ADMIN — amLODIPine 5 mg tablet: @ 22:00:00

## 2016-10-04 MED ADMIN — morphine 4 mg/mL injection syringe: INTRAVENOUS | @ 19:00:00

## 2016-10-04 NOTE — ED Nurses Note (Signed)
2100: FS 60. Pt given D50 per MD order [facility]

## 2016-10-04 NOTE — H&P (Signed)
[facility]  General Medicine  Admission H&P    Date of Service:  10/04/2016  Friend, [name], 46 y.o. male  Date of Admission:  10/04/2016  Date of Birth:  31-Oct-1970  PCP: No Pcp          Information Obtained from: patient and history reviewed via medical record  Chief Complaint:  Chest pain, abdominal pain    HPI: [name] is a 46 y.o., 83 American male who presents with chest pain, abdominal pain, diarrhea, vomiting. 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   Patient states he woke up today around 10:00 a.m. with left-sided chest pressure "like someone was sitting on my chest", which he believes is similar to the symptoms he had when he had a prior heart attack.  The pain radiates down his left arm and is associated with shortness of breath and diaphoresis when it 1st started.    Patient was admitted 5/27 with similar symptoms including chest plain, recurrent C diff, an ESRD but supposedly left AMA; was supposed to get dialysis and paracentesis at this time.   Patient has a history of ESR D and attends dialysis Monday Wednesday Friday.  He states he last went to dialysis on Friday.  However, he states he missed his appointment today (Monday) because he was unable to schedule it.  Patient states he does not make any urine.  He has dialysis access as a left port in his chest.    Patient has a history of ascites and states he is unsure of the cause.  He states he gets occasional paracentesis, the most recent about a week ago.      According to patient, he started developing foul smelling profuse diarrhea, 7 to 8 times a day, since Friday.  He reports this is at least 15 times this year he has had C diff and thinks that the C diff has returned.  Per chart review, ID evaluated patient last admission 5/27 for recurrent C diff, relapsing with multiple therapies, and recommended a fecal  transplant or colectomy.  Patient also reports diffuse lower abdominal pain that is constant and associated with nausea.  He reports difficulty tolerating p.o..  He reports he has had some vomiting.    Patient was found to be hyperkalemic in the ED and was given insulin and glucose.   However, his glucose dropped abruptly but slowly improved with dextrose.    PAST MEDICAL:    Past Medical History:   Diagnosis Date    Ascites     Asthma     Clostridium difficile infection 08/28/2016    +  C Diff 08/2016    Dialysis patient (Jud)     HTN (hypertension)     Kidney failure     MI, old         Past Surgical History:   Procedure Laterality Date    AV FISTULA PLACEMENT      HX CORONARY STENT PLACEMENT      HX HERNIA REPAIR              Medications Prior to Admission     Prescriptions    albuterol sulfate 90 mcg/Actuation Inhalation HFA Aerosol Inhaler oral inhaler    Take 1-2 Puffs by inhalation Every 6 hours as needed    carvedilol (COREG) 25 mg Oral Tablet    Take 25 mg by mouth  Twice daily with food    furosemide (LASIX) 80 mg Oral Tablet    Take 80 mg by mouth Once a day    hydrALAZINE (APRESOLINE) 100 mg Oral Tablet    Take 100 mg by mouth Three times a day    sevelamer (RENAGEL) 800 mg Oral Tablet    Take 800 mg by mouth Three times daily with meals        Allergies   Allergen Reactions    Lisinopril      Lip swelling      Morphine      Throat swelling      Toradol [Ketorolac]      Breathing issues       Vaccinations:      Pneumovax: unknown    Influenza: unknown    Family History  Family Medical History     Problem Relation (Age of Onset)    Healthy Father    Hypertension Mother          Social History  Social History     Social History    Marital status: Single     Spouse name: N/A    Number of children: N/A    Years of education: N/A     Occupational History    Not on file.     Social History Main Topics    Smoking status: Current Every Day Smoker     Packs/day: 0.25     Types: Cigarettes     Smokeless tobacco: Never Used    Alcohol use No    Drug use: No    Sexual activity: Not on file     Other Topics Concern    Not on file     Social History Narrative       ROS:   Constitutional: positive for malaise and anorexia  Eyes: negative for redness  Ears, nose, mouth, throat, and face: negative for facial trauma  Respiratory: positive for SOB  Cardiovascular: positive for chest pain  Gastrointestinal: positive for nausea, vomiting and abdominal pain  Genitourinary:aneuric  Musculoskeletal:negative for stiff joints  Neurological: negative for focal weakness        Examination:  Temperature: 36.4 C (97.5 F) Heart Rate: 93 BP (Non-Invasive): (!) 160/105   Respiratory Rate: 15 SpO2-1: 97 % Pain Score (Numeric, Faces): 10   Constitutional:  appears chronically ill, appears older than stated age and no distress  Eyes:  Conjunctiva clear., Pupils equal and round.   Respiratory:  Clear to auscultation bilaterally.   Cardiovascular:  regular rate and rhythm  Gastrointestinal:  mildly firm, mildly distended, diffusely tender  Musculoskeletal:  actively moving all extremities  Integumentary:  Skin warm and dry and dark skin discoloration to lower abdomen  Neurologic:  alert, awake, speech fluent  Psychiatric:  Affect Normal  Glasgow: Eye opening: 4 spontaneous, Verbal resonse:  5 oriented, Best motor response:  6 obeys commands  Coma (GCS Coma less than 8): Coma -Not applicable    Labs:    I have reviewed all lab results.    Imaging Studies:    I have reviewed all imaging results.    DNR Status:  Full Code    Assessment/Plan:   Active Hospital Problems    Diagnosis    Chest pain    [name] is a 46 y.o., 23 American male who presents with chest pain, abdominal pain, diarrhea, vomiting. Patient has a complex medical history including CAD with MI (2010, 2 stents, no longer on aspirin  Plavix), ESRD on MWF dialysis, HTN, asthma, ascites.     Chest Pain  Hx of CAD, MI with 2 stents 2010  - initial trop  80, trended and have been flat  - initial EKG without acute ST changes  - EKG PRN for CP  - sL nitro PRN for CP  - TTE ordered    ESRD, MWF dialysis  Aneuric   - last dialysis F, (missed today)  - L chest port for access  - K improved in ED, no indication for urgent dialysis at this time- will monitor  - continue home renvela 800 mg TID    Hyperkalemia- improving  - K was 6.1 on arrival  - given 10U insulin with dextrose in ED which decreased K to 5.1  - EKG with QTc prolonged at 506; avoid QTC prolonging medications  - urgent dialysis not indicated- will continue to monitor, repeat BMP 2200 and am    Hypoglycemia- improving  - BG decreased to 34 in ED after receiving insulin and glucose  - q1h fingersticks for now    Hx Ascites  Abdominal pain, concern for SBP/peritonitis  - CTA C/A/P today with sequale of cirrhosis and portal HTN  - per pt, aware he has ascites but states cause is unknown  - RUQ [place] ordered  - AST/ALT appropriate on arrival  - Hep C Ab neg, HIV neg, HbSAg neg (08/2016)  - starting on broad abx coverage IV vanc and cefepime  - smooth muscle, anti-smith, alpha 1 antitrypsin, ANA ordered  - s/p diagnostic and therapetutic paracentesis in ED, drained ~500 cc; SAAG 1.2- likely 2/2 portal HTN  - LDH, total protein, albumin, fluid culture/gm stain ordered; gm stain - no organisms seen, no PMNs  - Child-pugh score 7, class B  - MELD score 21, 19.6% mortality     Diarrhea, Abdominal pain  Hx chronic recurrent C Diff  - reports multiple loose stool past few days  - no leukocytosis on arrival  - c diff toxin ordered   - started on PO vancomycin now  - likely will need ID consult in the am as hx of recurrent, refractory C diff - fecal transplant recommended in past  - Tigan PRN for N/V       Chronic medical conditions:  - HTN: continue home coreg 25 mg BID, hydralazine 100 mg TID  - asthma: continue home albuterol     DVT/PE Prophylaxis: SCDs/ Venodynes/Impulse boots    Lawernce Ion, MD      I saw and  examined the patient.  I reviewed the resident's note.  I agree with the findings and plan of care as documented in the resident's note.  Any exceptions/additions are edited/noted.    Duard Larsen, MD

## 2016-10-04 NOTE — Pharmacy Vancomycin Dosing (Signed)
[facility] of [facility]  Therapeutic Drug Monitoring: Vancomycin  10/04/2016      Patient name: [name],[name]  Date of Birth:  27-Jan-1971    Actual Weight:  Weight: 114.4 kg (252 lb 3.3 oz) (10/04/16 1450)     BMI:  BMI (Calculated): 33.34 (10/04/16 1450)    Date RPh Current regimen (including mg/kg) Indication Target Levels (mcg/mL) SCr (mg/dL) CrCl* (mL/min) Measured level (mcg/mL) Plan (including when levels are due) Comments   6/18 Kolby Myung Vancomycin IV 1750 mg (20 mg/kg AdjBW) x 1 Empiric 13-17 iHD iHD  -One time doses of vancomycin and cefepime sent to the ED  -Further doses will be scheduled once patient arrives to floor                                                                              *Creatinine clearance is estimated by using the Cockcroft-Gault equation for adult patients and the Brendolyn PattySchwartz equation for pediatric patients.    The decision to discontinue vancomycin therapy will be determined by the primary service.  Please contact the pharmacist with any questions regarding this patient's medication regimen.

## 2016-10-04 NOTE — ED Attending Handoff Note (Signed)
Admit pending for hyperK, needs dialysis, hx ESRD

## 2016-10-04 NOTE — ED Nurses Note (Signed)
2020: Pt medicated for pain per MD orders [facility]

## 2016-10-04 NOTE — ED Nurses Note (Signed)
1950: Pt states he feels like blood sugar is dropping. Check and it was 34. MD notified pt given orange juice and D50 per MD order [facility]

## 2016-10-04 NOTE — ED Nurses Note (Signed)
FS 150

## 2016-10-04 NOTE — ED Nurses Note (Signed)
FS 95

## 2016-10-04 NOTE — Procedures (Signed)
Paracentesis    Procedure Date:  10/04/2016  Time:  1030pm  Procedure: Paracentesis  Diagnosis:  Ascites  Indication:  Ascites    Description:  After informed consent the patient was positioned. A surgical time out was performed to confirm correct patient and procedure.  The site was identified and prepped in the usual sterile fashion. The skin was prepped with chlorhexidine and 1% analgesia was used. The abdomen was examined and the appropriate site was determined to perform the paracentesis. Aseptic technique was utilized throughout the entire procedure. 500 cc of serous fluid was obtained without any difficulties.  Patient tolerated procedure without complications.       Erie NoeFatima Naqvi, MD

## 2016-10-04 NOTE — ED Provider Notes (Signed)
[facility]  Emergency Department  Provider Note    Name: [name]  Age and Gender: 46 y.o. male  Date of Birth: 1971/01/21  Date of Service: 10/04/2016  MRN: [id number]  PCP: No Pcp  Attending: Dr. Winona Legato    Chief Complaint   Patient presents with   . Chest Pain      L sided chest pain since this morning. h/o 2 cardiac stents, and MI in 2010.  abd pain, diarrhea, vomiting all weekend.    . Abdominal Pain       Clinical Impression:     Encounter Diagnosis   Name Primary?   . Chest pain, unspecified type Yes       MDM/Course:  [name] is a 46 y.o. male who presented with   Chief Complaint   Patient presents with   . Chest Pain      L sided chest pain since this morning. h/o 2 cardiac stents, and MI in 2010.  abd pain, diarrhea, vomiting all weekend.    . Abdominal Pain   .     Given patients history, current symptoms, and exam concern for, but not limited to, ACS, dissection, PE   Initial EKG not suspicious for acute cardiac injury, aspirin 324, nitro sublingual   Pain not improved with nitro, improved with dilaudid   Initial troponin 65 in patient with ESRD   Called medicine for admission    ED Course       Medications given:  Medications   NS flush syringe (not administered)     And   NS flush syringe (not administered)   nitroGLYCERIN (NITROSTAT) sublingual tablet (0.4 mg Sublingual Given 10/04/16 1606)   sodium bicarbonate 1 mEq/mL 50 mL injection (not administered)   dextrose 50% (0.5 g/mL) injection - syringe (not administered)   insulin R human 100 units/mL injection (not administered)   albuterol (PROVENTIL) 2.5 mg / 3 mL (0.083%) neb solution (not administered)   aspirin chewable tablet 324 mg (324 mg Oral Given 10/04/16 1605)   HYDROmorphone (DILAUDID) 2 mg/mL injection (0.4 mg Intravenous Given 10/04/16 1655)   ondansetron (ZOFRAN) 2 mg/mL injection (4 mg Intravenous Given 10/04/16 1655)   ioversol (OPTIRAY 350) infusion (100 mL Intravenous Given 10/04/16 1725)     Disposition: Data  Unavailable    New Prescriptions    No medications on file       HPI:  [name] is a 46 y.o. Black/African American male presenting with   Chief Complaint   Patient presents with   . Chest Pain      L sided chest pain since this morning. h/o 2 cardiac stents, and MI in 2010.  abd pain, diarrhea, vomiting all weekend.    . Abdominal Pain     Patient presents with 10/10 chest pain that woke him up from sleeping this morning around 9:30 a.m..  He states that his pain is characterized as pressure, associated with nausea, pain radiating down his left arm, and is consistent with his prior cardiac pain.  Patient has history of 2 stents placed in 2010, states that he is not currently take aspirin nor Plavix, and has not taken either of these medications since 2012.  In addition, patient complains of abdominal pain and watery diarrhea going on for multiple months.  He states that he goes is typically 7 to 8 times a day, has been treated incompletely for C diff in the recent past.  He does endorse occasional  bits of blood with his bowel movements.  Patient is ESRD , with dialysis on Monday Wednesday Friday most recently on Friday where he underwent normal session with removal of 3.5-4 L. He states he feels like he is more swollen today.  He is on his normal home oxygen which is 3-4 L as needed during the day and 3-4 L by nasal cannula over night.  He states that the amount that he has been eating during the day has slowly increased in the recent past.  He has abdominal distension, reports that he requires serial paracentesis almost weekly, states that he is unsure the etiology of his ascites.  He denies any history of cirrhosis, alcohol use, hepatitis C the.  States that he had a mass seen on scan that has not been evaluated or thoroughly worked up.  In addition, patient reports a fever of 101 last night.  And recent hospitalization last month for pneumonia and C diff.    Review of Systems - ALL NEGATIVE UNLESS BOLDED  AND UNDERLINES:  Constitutional: night sweats, weight loss, fevers   Integumentary: jaundice, rash, skin lesions.   Eyes: blurred vision, eye pain, discharge, redness.   ENT: tinnitus, hearing loss, dysphagia  Cardiac: chest pain, shortness of breath, orthopnea, palpitations, syncope.   Respiratory: wheeze, dyspnea on exertion, cough, shortness of breath.  Heme/Lymph: lymphadenopathy in neck, axilla or groin.  Endocrine: polyuria, polydipsia, polyphagia.    GI: diarrhea, constipation, heartburn, nausea, vomiting.  GU: urgency, hematuria, dysuria.   Musculoskeletal: arthralgias, myalgias.  Neuro: focal weakness, loss of sensation.      Below pertinent information reviewed with patient:  Past Medical History:   Diagnosis Date   . Ascites    . Asthma    . Clostridium difficile infection 08/28/2016    +  C Diff 08/2016   . Dialysis patient (HCC)    . HTN (hypertension)    . Kidney failure    . MI, old        Medications Prior to Admission     Prescriptions    albuterol sulfate 90 mcg/Actuation Inhalation HFA Aerosol Inhaler oral inhaler    Take 1-2 Puffs by inhalation Every 6 hours as needed    carvedilol (COREG) 25 mg Oral Tablet    Take 25 mg by mouth Twice daily with food    furosemide (LASIX) 80 mg Oral Tablet    Take 80 mg by mouth Once a day    hydrALAZINE (APRESOLINE) 100 mg Oral Tablet    Take 100 mg by mouth Three times a day    sevelamer (RENAGEL) 800 mg Oral Tablet    Take 800 mg by mouth Three times daily with meals          Allergies   Allergen Reactions   . Lisinopril      Lip swelling     . Morphine      Throat swelling     . Toradol [Ketorolac]      Breathing issues       Past Surgical History:   Procedure Laterality Date   . AV FISTULA PLACEMENT     . HX CORONARY STENT PLACEMENT     . HX HERNIA REPAIR         Family Medical History     None          Social History     Social History   . Marital status: Single     Spouse name: N/A   .  Number of children: N/A   . Years of education: N/A     Social History  Main Topics   . Smoking status: Current Every Day Smoker     Packs/day: 0.25     Types: Cigarettes   . Smokeless tobacco: Never Used   . Alcohol use No   . Drug use: No   . Sexual activity: Not on file     Other Topics Concern   . Not on file     Social History Narrative         Objective:  ED Triage Vitals   Enc Vitals Group      BP (Non-Invasive) 10/04/16 1450 200/108      Heart Rate 10/04/16 1450 114      Respiratory Rate 10/04/16 1450 20      Temperature 10/04/16 1450 36.4 C (97.5 F)      Temp src --       SpO2-1 10/04/16 1450 93 %      Weight 10/04/16 1450 114.4 kg (252 lb 3.3 oz)      Height 10/04/16 1450 1.854 m (6\' 1" )      Head Cir --       Peak Flow --       Pain Score --       Pain Loc --       Pain Edu? --       Excl. in GC? --          Physical Exam  I reviewed nursing notes and vital signs    General: alert and cooperative. Not in pain or distress.   HEENT: AT/NC, Non icteric conjunctiva, PERRL, EOMI, vision is grossly intact. Mucous membranes moist   Neck: neck supple, trachea midline. No appreciable JVD   Cardiovascular: RRR, S1/S2 audible, no murmurs or gallops.    Respiratory: Clear to auscultation bilaterally, diminished but without crackles/wheezing   Gastrointestional: Positive bowel sounds, Round, soft, non-tender. No guarding or rebound. No masses. Distended, positive fluid wave   Extremities: No deformity or joint abnormality. 2+ pitting edema b/l to knee, no cyanosis or pallor.   Neuro: A&O  * 3, CN II-XII grossly intact. Strength and sensation grossly intact throughout.   Psychiatric: The patient was able to demonstrate good judgement and reason, without hallucinations, abnormal affect or abnormal behaviors during the examination. Patient is not suicidal.     Labs:   Labs Reviewed   BASIC METABOLIC PANEL - Abnormal; Notable for the following:        Result Value    POTASSIUM 6.1 (*)     CALCIUM 7.2 (*)     BUN 47 (*)     CREATININE 8.89 (*)     BUN/CREA RATIO 5 (*)     ESTIMATED GFR 8 (*)      All other components within normal limits   TROPONIN-I (FOR ED ONLY) - Abnormal; Notable for the following:     TROPONIN I 65 (*)     All other components within normal limits   CBC WITH DIFF - Abnormal; Notable for the following:     RBC 2.59 (*)     HGB 8.1 (*)     HCT 24.6 (*)     RDW 17.1 (*)     PLATELETS 107 (*)     LYMPHOCYTE # 0.28 (*)     All other components within normal limits   HEPATIC FUNCTION PANEL - Abnormal; Notable for the following:     ALBUMIN  3.1 (*)     ALKALINE PHOSPHATASE 261 (*)     All other components within normal limits    Narrative:     Hemolysis can alter results at this level (mild).  Hemolysis alters results at this level (mild). Repeat testing with new specimen suggested.   VENOUS BLOOD GAS/LACTATE/CO-OX/LYTES (NA/K/CA/CL/GLUC) - Abnormal; Notable for the following:     SODIUM 135 (*)     WHOLE BLOOD POTASSIUM 5.9 (*)     IONIZED CALCIUM 0.86 (*)     HEMOGLOBIN 7.8 (*)     CARBOXYHEMOGLOBIN 4.8 (*)     All other components within normal limits   PHOSPHORUS - Abnormal; Notable for the following:     PHOSPHORUS 5.9 (*)     All other components within normal limits    Narrative:     Hemolysis alters results at this level (mild). Repeat testing with new specimen suggested.   LIPASE - Abnormal; Notable for the following:     LIPASE 175 (*)     All other components within normal limits    Narrative:     Hemolysis can alter results at this level (mild).   PT/INR - Normal    Narrative:     Coumadin therapy INR range for Conventional Anticoagulation is 2.0 to 3.0 and for Intensive Anticoagulation 2.5 to 3.5.   PTT (PARTIAL THROMBOPLASTIN TIME) - Normal    Narrative:     Therapeutic range for unfractionated heparin is 60.0-100.0 seconds.   HEPATITIS C ANTIBODY SCREEN WITH REFLEX TO HCV PCR - Normal   HIV1/HIV2 SCREEN, COMBINED ANTIGEN AND ANTIBODY - Normal   MAGNESIUM - Normal   CBC/DIFF    Narrative:     The following orders were created for panel order CBC/DIFF.  Procedure                                Abnormality         Status                     ---------                               -----------         ------                     CBC WITH ZOXW[[id number]]                Abnormal            Final result                 Please view results for these tests on the individual orders.       Imaging:  XR AP MOBILE CHEST   Final Result   Findings which may represent early pulmonary edema including cardiomegaly,   small bilateral pleural effusions, central vascular prominence and hazy   bibasilar airspace opacities.         STAFF REVISION: There is indeed bilateral pleural thickening. However, in   light of the absence of prior radiographs, this appearance may simply be   related to bilateral pleural thickening as opposed to free-flowing pleural   fluid. Bilateral decubitus views may be more helpful. 10/04/2016 5:21 PM   Rosaria Ferries, MD         CT ANGIO  CHEST ABDOMEN PELVIS W/WO IV CONTRAST    (Results Pending)       EKG: NSR without acute ST segment changes per my read    Patient seen by and discussed with attending physician, Dr. Shirlee More of this patients chart were completed in a retrospective fashion due to simultaneous direct patient care activities in the Emergency Department.   This note was partially generated using MModal Fluency Direct system, and there may be some incorrect words, spellings, and punctuation that were not noted in checking the note before saving.      Lovie Chol, MD 10/04/2016, 17:53  Internal Medicine PGY-1  Pager [phone]

## 2016-10-04 NOTE — ED Nurses Note (Signed)
1900: Received verbal pt report from [place], [place]

## 2016-10-05 ENCOUNTER — Inpatient Hospital Stay (HOSPITAL_COMMUNITY): Payer: Medicare Other

## 2016-10-05 ENCOUNTER — Inpatient Hospital Stay (EMERGENCY_DEPARTMENT_HOSPITAL): Payer: Medicare Other

## 2016-10-05 DIAGNOSIS — Z9049 Acquired absence of other specified parts of digestive tract: Secondary | ICD-10-CM

## 2016-10-05 DIAGNOSIS — R188 Other ascites: Secondary | ICD-10-CM

## 2016-10-05 DIAGNOSIS — R079 Chest pain, unspecified: Secondary | ICD-10-CM

## 2016-10-05 DIAGNOSIS — R197 Diarrhea, unspecified: Secondary | ICD-10-CM

## 2016-10-05 DIAGNOSIS — E875 Hyperkalemia: Secondary | ICD-10-CM

## 2016-10-05 DIAGNOSIS — N186 End stage renal disease: Secondary | ICD-10-CM

## 2016-10-05 DIAGNOSIS — R160 Hepatomegaly, not elsewhere classified: Secondary | ICD-10-CM

## 2016-10-05 DIAGNOSIS — R109 Unspecified abdominal pain: Secondary | ICD-10-CM

## 2016-10-05 DIAGNOSIS — E162 Hypoglycemia, unspecified: Secondary | ICD-10-CM

## 2016-10-05 DIAGNOSIS — I517 Cardiomegaly: Secondary | ICD-10-CM

## 2016-10-05 LAB — ALBUMIN, BODY FLUID: ALBUMIN BODY FLUID: 1.9 g/dL

## 2016-10-05 LAB — CBC WITH DIFF
BASOPHIL #: 0.03 10*3/uL (ref 0.00–0.20)
BASOPHIL %: 1 %
EOSINOPHIL #: 0.38 x10ˆ3/uL (ref 0.00–0.50)
EOSINOPHIL %: 9 %
HCT: 25.4 % — ABNORMAL LOW (ref 36.7–47.0)
HGB: 8.3 g/dL — ABNORMAL LOW (ref 12.5–16.3)
LYMPHOCYTE #: 0.31 x10ˆ3/uL — ABNORMAL LOW (ref 1.00–4.80)
LYMPHOCYTE %: 7 %
MCH: 31 pg (ref 27.4–33.0)
MCHC: 32.5 g/dL (ref 32.5–35.8)
MCV: 95.5 fL (ref 78.0–100.0)
MONOCYTE #: 0.3 x10ˆ3/uL (ref 0.30–1.00)
MONOCYTE %: 7 %
MPV: 8 fL (ref 7.5–11.5)
NEUTROPHIL #: 3.2 x10ˆ3/uL (ref 1.50–7.70)
NEUTROPHIL %: 76 %
PLATELETS: 97 x10ˆ3/uL — ABNORMAL LOW (ref 140–450)
RBC: 2.67 x10ˆ6/uL — ABNORMAL LOW (ref 4.06–5.63)
RDW: 16.9 % — ABNORMAL HIGH (ref 12.0–15.0)
WBC: 4.2 10*3/uL (ref 3.5–11.0)

## 2016-10-05 LAB — POC BLOOD GLUCOSE (RESULTS)
GLUCOSE, POC: 121 mg/dL — ABNORMAL HIGH (ref 70–105)
GLUCOSE, POC: 123 mg/dL — ABNORMAL HIGH (ref 70–105)
GLUCOSE, POC: 128 mg/dL — ABNORMAL HIGH (ref 70–105)
GLUCOSE, POC: 129 mg/dL — ABNORMAL HIGH (ref 70–105)
GLUCOSE, POC: 154 mg/dL — ABNORMAL HIGH (ref 70–105)
GLUCOSE, POC: 78 mg/dL (ref 70–105)

## 2016-10-05 LAB — BASIC METABOLIC PANEL
ANION GAP: 12 mmol/L (ref 4–13)
BUN/CREA RATIO: 5 — ABNORMAL LOW (ref 6–22)
BUN: 47 mg/dL — ABNORMAL HIGH (ref 8–25)
CALCIUM: 6.9 mg/dL — ABNORMAL LOW (ref 8.5–10.2)
CHLORIDE: 102 mmol/L (ref 96–111)
CO2 TOTAL: 24 mmol/L (ref 22–32)
CREATININE: 9.17 mg/dL — ABNORMAL HIGH (ref 0.62–1.27)
CREATININE: 9.17 mg/dL — ABNORMAL HIGH (ref 0.62–1.27)
ESTIMATED GFR: 8 mL/min/{1.73_m2} — ABNORMAL LOW (ref >59)
GLUCOSE: 91 mg/dL (ref 65–139)
POTASSIUM: 5.8 mmol/L — ABNORMAL HIGH (ref 3.5–5.1)
SODIUM: 138 mmol/L (ref 136–145)

## 2016-10-05 LAB — PROTEIN BODY FLUID: PROTEIN BODY FLUID: 3.3 g/dL

## 2016-10-05 LAB — ECG 12-LEAD
Atrial Rate: 90 {beats}/min
Atrial Rate: 92 {beats}/min
Calculated P Axis: 54 degrees
Calculated R Axis: 27 degrees
PR Interval: 212 ms
QRS Duration: 86 ms
QRS Duration: 86 ms
QT Interval: 414 ms
QT Interval: 392 ms
QTC Calculation: 506 ms
QTC Calculation: 484 ms
Ventricular rate: 90 {beats}/min

## 2016-10-05 LAB — BODY FLUID SEROUS MAN DIFF

## 2016-10-05 LAB — MAGNESIUM: MAGNESIUM: 2 mg/dL (ref 1.6–2.5)

## 2016-10-05 LAB — SM (SMITH) ANTIBODIES, IGG, SERUM
SM ANTIBODIES, IGG, QUALITATIVE: NEGATIVE
SM ANTIBODIES, IGG, QUANTITATIVE: 9 [AU]/ml (ref <150)

## 2016-10-05 LAB — HEPATITIS B SURFACE ANTIGEN: HBV SURFACE ANTIGEN QUALITATIVE: NEGATIVE

## 2016-10-05 LAB — BODY FLUID CELL COUNT WITH DIFFERENTIAL
NUCLEATED CELLS, FLUID: 9 /uL
RBC COUNT: 872 /uL

## 2016-10-05 LAB — TROPONIN-I
TROPONIN I: 62 ng/L (ref 0–30)
TROPONIN I: 75 ng/L — ABNORMAL HIGH (ref 0–30)
TROPONIN I: 75 ng/L — ABNORMAL HIGH (ref 0–30)
TROPONIN I: 76 ng/L — ABNORMAL HIGH (ref 0–30)
TROPONIN I: 78 ng/L — ABNORMAL HIGH (ref 0–30)

## 2016-10-05 LAB — PHOSPHORUS: PHOSPHORUS: 5.9 mg/dL — ABNORMAL HIGH (ref 2.4–4.7)

## 2016-10-05 LAB — ALPHA-1-ANTITRYPSIN: ALPHA 1 ANTITRYPSIN: 189 mg/dL (ref 90–200)

## 2016-10-05 MED ORDER — SODIUM CITRATE 4% INJECTABLE
2.0000 | INJECTION | Status: AC
Start: 2016-10-05 — End: 2016-10-05
  Administered 2016-10-05: 2
  Filled 2016-10-05 (×2): qty 5

## 2016-10-05 MED ORDER — OXYCODONE 5 MG TABLET
5.0000 mg | ORAL_TABLET | ORAL | Status: DC | PRN
Start: 2016-10-05 — End: 2016-10-06

## 2016-10-05 MED ORDER — ALBUTEROL SULFATE HFA 90 MCG/ACTUATION AEROSOL INHALER - RN
2.00 | Freq: Four times a day (QID) | RESPIRATORY_TRACT | Status: DC | PRN
Start: 2016-10-05 — End: 2016-10-06
  Filled 2016-10-05: qty 8

## 2016-10-05 MED ORDER — PREMIXED HEMODIALYSATE
Status: AC
Start: 2016-10-05 — End: 2016-10-05

## 2016-10-05 MED ORDER — HYDROMORPHONE 2 MG/ML INJECTION SYRINGE
0.4000 mg | INJECTION | Freq: Once | INTRAMUSCULAR | Status: AC
Start: 2016-10-06 — End: 2016-10-05
  Administered 2016-10-05: 0.4 mg via INTRAVENOUS
  Filled 2016-10-05: qty 1

## 2016-10-05 MED ADMIN — vancomycin 50 mg/mL oral solution: ORAL | @ 06:00:00

## 2016-10-05 MED ADMIN — HYDROmorphone 2 mg/mL injection syringe: INTRAVENOUS | @ 21:00:00

## 2016-10-05 MED ADMIN — hydrALAZINE 50 mg tablet: ORAL | @ 14:00:00

## 2016-10-05 MED ADMIN — CEFAZOLIN IVPB 2G IN 50ML (LOCKED): INTRAVENOUS | @ 18:00:00

## 2016-10-05 MED ADMIN — lactated Ringers intravenous solution: INTRAVENOUS | @ 02:00:00 | NDC 00338011704

## 2016-10-05 MED ADMIN — sodium chloride 0.9 % intravenous solution: ORAL | NDC 00338004904

## 2016-10-05 MED ADMIN — sodium chloride 0.9 % intravenous solution: INTRAVENOUS | @ 09:00:00 | NDC 00338004904

## 2016-10-05 NOTE — Nurses Notes (Signed)
Pt refusing Echo Service notified

## 2016-10-05 NOTE — Pharmacy Vancomycin Dosing (Signed)
[facility]  Therapeutic Drug Monitoring: Vancomycin  10/05/2016      Patient name: [name],[name]  Date of Birth:  06/04/1970    Actual Weight:  Weight: 114.4 kg (252 lb 3.3 oz) (10/05/16 1257)     BMI:  BMI (Calculated): 33.35 (10/05/16 1257)    Date RPh Current regimen (including mg/kg) Indication Target Levels (mcg/mL) SCr (mg/dL) CrCl* (mL/min) Measured level (mcg/mL) Plan (including when levels are due) Comments   6/18 [facility] Vancomycin IV 1750 mg (20 mg/kg AdjBW) x 1 [facility] 13-17 iHD iHD  -One time doses of vancomycin and cefepime sent to the ED  -Further doses will be scheduled once patient arrives to floor    6/19 cew Received vancomycin 1750mg  x1 in the ED [facility] - Abdominal pain, concern for SBP 20-25 IHD IHD -- Vancomycin 1750mg  (18mg /kg; AdjBW) x1 in HD today. Patient is normally HD MWF, evaluate plan for dialysis tomorrow to determine when to schedule next dose and draw a level.                                                                  *Creatinine clearance is estimated by using the Cockcroft-Gault equation for adult patients and the Brendolyn PattySchwartz equation for pediatric patients.    The decision to discontinue vancomycin therapy will be determined by the primary service.  Please contact the pharmacist with any questions regarding this patient's medication regimen.

## 2016-10-05 NOTE — ED Nurses Note (Signed)
0103: patient to US on monitor with ED tech.

## 2016-10-05 NOTE — Nurses Notes (Signed)
Patient arrived to 8W bed 22. Patient on 3 L NC complaining of abdominal and chest pain. Patient refused nitroglycerin. BP 159/101. Service notified. PRN Dilaudid administered

## 2016-10-05 NOTE — ED Nurses Note (Signed)
voicecare completed at this time.

## 2016-10-05 NOTE — Nurses Notes (Signed)
Hemodialysis completed as ordered, tolerated well, UF .  Patient remained hypertensive throughout treatment and continued to refuse treatment as he had done on the floor.  After his BP reached 200's/100s, he eventually agreed to take his BP meds and he finished up 165/94.  Left IJ catheter ran well, site WDL, dressing changed via sterile technique.  Report called to bedside nurse.  Escorted back to room by RN and transport team.

## 2016-10-05 NOTE — Progress Notes (Signed)
[facility]  Nephrology Progress Note    [name], [name]  Date of service: 10/05/2016  Hospital Day:  LOS: 0 days   Antibiotic Day: 1    Subjective: No issues overnight. Continues to have chest pain, abdominal pain, and diarrhea. Refusing oral meds.     Vital Signs:  Temp (24hrs) Max:36.8 C (98.2 F)      Temperature: 36.6 C (97.9 F) (10/05/16 1836)  BP (Non-Invasive): (!) 144/94 (10/05/16 1834)  MAP (Non-Invasive): 108 mmHG (10/05/16 1834)  Heart Rate: 90 (10/05/16 1834)  Respiratory Rate: 19 (10/05/16 1834)  Pain Score (Numeric, Faces): 10 (10/05/16 2038)  SpO2-1: 100 % (10/05/16 0327)    Physical Exam:  Constitutional:  appears chronically ill, appears older than stated age and no distress  Eyes:  Conjunctiva clear., Pupils equal and round.   Respiratory:  Clear to auscultation bilaterally.   Cardiovascular:  regular rate and rhythm  Gastrointestinal:  mildly firm, mildly distended, diffusely tender  Musculoskeletal:  actively moving all extremities  Integumentary:  Skin warm and dry and dark skin discoloration to lower abdomen  Neurologic:  alert, awake, speech fluent  Psychiatric:  Affect Normal  Glasgow: Eye opening: 4 spontaneous, Verbal resonse:  5 oriented, Best motor response:  6 obeys commands  Coma (GCS Coma less than 8): Coma -Not applicable    I/O:  I/O last 24 hours:    Intake/Output Summary (Last 24 hours) at 10/05/16 2101  Last data filed at 10/05/16 1835   Gross per 24 hour   Intake              840 ml   Output             3000 ml   Net            -2160 ml     I/O current shift:  06/19 1600 - 06/19 2359  In: 360 [P.O.:360]  Out: 3000 [Dialysis:3000]  Dialysis treatment total fluid removal:  Treatment Total Fluid Removal: 3000 mL  Post dialysis weight:  Weight (Post Dialysis): 112.4 kg (247 lb 12.8 oz)    Hardware (Lines, foley, tubes):   Date Placed  Date Changed   1. PIV     2. TCC      3.     4.     5.       Antibiotics: Date Started Date Completed   1. Vancomycin      2. Cefepime       3.     4.       Labs:  I have reviewed lab results.    CBC   Recent Labs      10/04/16   1555  10/05/16   0402   WBC  5.4  4.2   HGB  8.1*  8.3*   HCT  24.6*  25.4*   PLTCNT  107*  97*      Diff   Recent Labs      10/04/16   1555  10/05/16   0402   PMNS  80  76   LYMPHOCYTES  5  7   MONOCYTES  7  7   EOSINOPHIL  8  9   BASOPHILS  1  0.03  1  0.03   PMNABS  4.34  3.20   LYMPHSABS  0.28*  0.31*   EOSABS  0.45  0.38   MONOSABS  0.35  0.30        BMP   Recent Labs  10/04/16   1555  10/04/16   1655  10/04/16   1952  10/04/16   2206  10/05/16   0402   SODIUM  135*  139   --   137  138   POTASSIUM   --   6.1*  5.1  5.8*  5.8*   CHLORIDE  104  105   --   102  102   CO2   --   22   --   22  24   BUN   --   47*   --   49*  47*   CREATININE   --   8.89*   --   9.12*  9.17*   GLUCOSENF   --   76   --   69  91   ANIONGAP   --   12   --   13  12   BUNCRRATIO   --   5*   --   5*  5*   GFR   --   8*   --   8*  8*        Recent Labs      10/04/16   1555  10/04/16   1655  10/04/16   2206  10/05/16   0402   CALCIUM   --   7.2*  7.1*  6.9*   IONCALCIUM  0.86*   --    --    --    MAGNESIUM  2.3  2.0   --   2.0   PHOSPHORUS  5.9*  6.0*   --   5.9*         Coagulation Studies   Recent Labs      10/04/16   1555   PROTHROMTME  13.1   INR  1.13   APTT  36.1        Blood Gas Studies   Recent Labs      10/04/16   1555   FI02  32.0   PH  7.35   PCO2  45.00   PO2  40.0   BICARBONATE  24.0   BASEDEFICIT  0.8        Liver/Pancreas:   Recent Labs      10/04/16   1555   TOTALPROTEIN  6.5   ALBUMIN  3.1*   AST  33   ALT  15   ALKPHOS  261*   LIPASE  175*   TOTBILIRUBIN  0.7   BILIRUBINCON  0.2          Radiology:   Results for orders placed or performed during the hospital encounter of 10/04/16   XR AP MOBILE CHEST     Status: None    Narrative    [name]  Male, 46 years old.    XR AP MOBILE CHEST performed on 10/04/2016 4:29 PM.    REASON FOR EXAM:  Chest Pain    TECHNIQUE: 1 view/1 image(s) submitted for interpretation.    COMPARISON:  None    FINDINGS:    Shallow inspiration is present.    LUNGS: Nonspecific hazy opacities are seen overlying bilateral lower lung  fields. Minimal bibasilar atelectasis present.    HEART AND VASCULATURE: Cardiomegaly is seen. Vascular stent graft is seen  overlying the region of RIGHT subclavian. Central vascular prominence is  noted.    PLEURA: Trace bilateral pleural effusions are present. No pneumothorax is  demonstrated.    SUPPORT DEVICES:  LEFT subclavian approach dual lumen  central venous catheter is seen with  tip overlying the RIGHT atrium.    OTHER:  Surgical clips are seen within the LEFT axilla.      Impression    Findings which may represent early pulmonary edema including cardiomegaly,  small bilateral pleural effusions, central vascular prominence and hazy  bibasilar airspace opacities.      STAFF REVISION: There is indeed bilateral pleural thickening. However, in  light of the absence of prior radiographs, this appearance may simply be  related to bilateral pleural thickening as opposed to free-flowing pleural  fluid. Bilateral decubitus views may be more helpful. 10/04/2016 5:21 PM  Rosaria Ferries, MD     CT ANGIO CHEST ABDOMEN PELVIS W/WO IV CONTRAST     Status: None    Narrative    [name]  Male, 46 years old.    CT ANGIO CHEST ABDOMEN PELVIS W/WO IV CONTRAST performed on 10/04/2016 5:50  PM.    REASON FOR EXAM:  ? dissection, colitis, ascities of unclear etiology,  chest pain, ESRD    TECHNIQUE: CTA  chest abdomen pelvis protocol.    RADIATION DOSE: 2811.40 mGycm  CONTRAST: 100 ml's of Optiray 350    COMPARISON: None available.    FINDINGS:    VASCULAR:    HEART: Cardiomegaly is seen. Moderate coronary calcifications including the  LAD is seen. Small pericardial effusion is present.     AORTA: Visualized ascending aorta is of normal caliber. Normal aortic arch  is seen. Descending thoracic aorta is of normal caliber. Visualized  abdominal aorta is of normal caliber.    PULMONARY TREE: Main  pulmonary arteries are of normal caliber. No  thromboembolus is seen within the main pulmonary arteries.     BRANCH VESSELS: Celiac axis, SMA and IMA origins are patent without  significant stenosis. Origins the bilateral renal arteries are patent  without high-grade stenosis.    LOWER EXTREMITY VESSELS: Bilateral common iliacs are of normal caliber.  External and internal iliacs are of normal caliber. Bilateral common  femoral and SFA arteries are   patent.     OTHER: Severe atherosclerotic disease is seen within the abdominal aorta.  No significant flow-limiting stenoses are seen throughout.     VENOUS: RIGHT dual-lumen internal jugular central venous catheter is seen  with tip within the RIGHT atrium. Hepatic, portal veins and SMV are patent.  Prominent venous collaterals/varices are partially seen within the upper  anterior chest wall.     OTHER:     CHEST:    LUNGS: Major airways are patent. Mild central bronchiectasis is present.  Minimal bibasilar atelectasis is resident on specific groundglass opacities  are seen throughout additionally there is demonstration of interlobular  septal thickening.    PLEURA: Small bilateral pleural effusions LEFT greater than RIGHT are seen.    MEDIASTINUM/HILA: Nonspecific mediastinal adenopathy is seen which may be  reactive in nature. Thyroid is homogenous in appearance. Esophagus is  decompressed. No mediastinal mass is seen.    ABDOMEN/PELVIS:    LIVER: Normal in size. Liver demonstrates mildly lobular contour consistent  with cirrhosis/fibrosis.    SPLEEN: Mild splenomegaly is seen.    GALLBLADDER: Unremarkable. No cholelithiasis or signs of acute  cholecystitis are demonstrated. No biliary ductal dilatation is present.    PANCREAS: Normal. No ductal dilatation is demonstrated.    ADRENALS: Bilateral adrenals are unremarkable.    KIDNEYS: Severely atrophic. No hydronephrosis. Visualized ureters of normal  caliber. Bladder is nonopacified and decompressed.  REPRODUCTIVE:  Normal.    STOMACH: Evaluation of the stomach is limited secondary to lack of  opacification and distention.    BOWEL: Evaluation of the bowel is limited secondary to lack of distention  and lack of opacification. No signs of obstruction are noted. Bowel is  normal caliber throughout. Normal appendix is demonstrated. There is  demonstration of numerous central loops of small bowel with suggestion of  wall thickening. Findings likely represent nonspecific enteritis.    MESENTERIC LYMPH NODES: No significant lymphadenopathy seen within the  abdomen or pelvis.    RETROPERITONEUM: No free air is demonstrated. Moderate volume ascites is  present.    ABDOMINAL WALL: Abdominal wall is unremarkable without significant hernia.  Moderate anasarca is seen. Postsurgical changes from prior lower abdominal  wall hernia repair seen.    BONES: No acute bony abnormalities are demonstrated. Well-corticated lucent  lesion seen involving the inferior endplate of L2 favored to represent  Schmorl's node.      Impression    1.  No evidence of aortic dissection.  2.  Findings concerning for pulmonary edema including cardiomegaly,  interlobular septal thickening along with nonspecific scattered groundglass  opacities. Additionally small bilateral pleural effusions LEFT greater than  RIGHT are present. Additionally superimposed infectious process within the  bilateral lower lobes cannot be excluded.  3.  Small pericardial effusion and minor coronary calcifications including  the LAD.  4.  Mediastinal lymphadenopathy likely reactive in nature.  5.  Nonspecific enteritis which may be reactive in nature secondary to  moderate volume ascites.  6.  Findings concerning for sequela of cirrhosis/fibrosis including lobular  contour of the liver, stigmata of portal hypertension including above-noted  moderate volume ascites and mild splenomegaly and prominent anterior chest  wall venous collaterals/varices.  7.  Moderate anasarca.     [place] RT UPPER  QUADRANT     Status: None    Narrative    [name]  Male, 45 years old.    [place] RT UPPER QUADRANT performed on 10/05/2016 9:40 AM.    REASON FOR EXAM:  ascites, abdominal pain    Ultrasound imaging of the right upper quadrant as compared to the CT  angiogram of the chest abdomen and pelvis performed on October 04, 2016.  Additional history from the chart indicates end-stage renal disease and  hemodialysis.    Ultrasound imaging shows the liver is enlarged measuring 20.5 cm in length.  Echogenicity is within normal limits with no fatty change. No focal lesions  are seen. There are slightly lobular contours, with mild coarsening of the  echotexture which can be seen with cirrhosis.    Doppler confirms patency of the portal vein and hepatic veins with  appropriate direction of flow. The patient had difficulty voluntarily  suspending respirations.    I do not see any intrahepatic nor extrahepatic biliary duct dilation.  Common bile duct is 6.7 mm. Gallbladder is not visualized, question  previous cholecystectomy.    Ascites is identified within the upper quadrant, and ascites was seen  throughout the peritoneal cavity on the CT.    There is a markedly atrophic right kidney being 5.9 x 1.7 cm consistent  with history of end-stage renal disease and similar to the CT.    Only a small portion of the body the pancreas is visualized, and appears  unremarkable.        Impression    Hepatomegaly with no fatty change nor focal lesions evident.  There are slightly lobular  contours with mild coarsening of the echotexture  which can be seen with cirrhosis.    There is no biliary duct dilation. Gallbladder is not visualized, question  cholecystectomy.    Doppler confirms patency of the portal vein and hepatic veins with  appropriate direction of flow.    Ascites remains evident.         PT/OT: Yes      Current Facility-Administered Medications:  albuterol (PROVENTIL) 2.5 mg / 3 mL (0.083%) neb solution 2.5 mg Nebulization Q4H PRN    albuterol 90 mcg per inhalation oral inhaler - "Nursing to administer" 2 Puff Inhalation Q6H PRN   carvedilol (COREG) tablet 25 mg Oral 2x/day-Food   hydrALAZINE (APRESOLINE) tablet 100 mg Oral 3x/day   HYDROmorphone (DILAUDID) 2 mg/mL injection 0.4 mg Intravenous Q3H PRN   nitroGLYCERIN (NITROSTAT) sublingual tablet 0.4 mg Sublingual Q5 Min PRN   NS flush syringe 2 mL Intracatheter Q8HRS   And      NS flush syringe 2-6 mL Intracatheter Q1 MIN PRN   NS flush syringe 2 mL Intracatheter Q8HRS   And      NS flush syringe 2-6 mL Intracatheter Q1 MIN PRN   perflutren lipid microspheres (DEFINITY) 1.1 mg/mL injection 0.3 mL Intravenous Give in Cardiology   sevelamer carbonate (RENVELA) tablet 800 mg Oral 3x/day-Meals   trimethobenzamide (TIGAN) 100 mg/mL IM injection 200 mg Intramuscular Q6H PRN   vancomycin 50 mg per mL oral liquid with cherry flavoring 125 mg Oral Q6HRS   Vancomycin IV - Pharmacist to Dose per Protocol  Does not apply Daily PRN     Allergies   Allergen Reactions   . Lisinopril      Lip swelling     . Morphine      Throat swelling     . Toradol [Ketorolac]      Breathing issues       Consults: Time  Recommendation:  Complete:    1.      2.      3.          Assessment/ Plan:   Active Hospital Problems    Diagnosis   . Chest pain     [name] is a 46 y.o., Black/African American male who presents with chest pain, abdominal pain, diarrhea, vomiting. 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     Atypical Chest Pain  Hx of CAD, MI with 2 stents 2010  - initial trop 80, trended and have been flat  - initial EKG without acute ST changes  - EKG PRN for CP  - sL nitro PRN for CP  - TTE ordered  - MPS ordered. NPO at midnight.     Diarrhea, Abdominal pain  Hx chronic recurrent C Diff  - reports multiple loose stool past few days  - afebrile. no leukocytosis  - c diff toxin ordered   - PO vancomycin  now  - will consult ID in the am as hx of recurrent, refractory C diff - fecal transplant recommended in past  - Tigan PRN for N/V     Hx Ascites  Abdominal pain, concern for SBP/peritonitis  - CTA C/A/P with sequale of cirrhosis and portal HTN  - per pt, aware he has ascites but states cause is unknown  - RUQ [place] ordered  - AST/ALT appropriate on arrival  - Hep C Ab neg, HIV neg,  HbSAg neg (08/2016)  - continue broad abx coverage IV vanc and cefepime  - smooth muscle, anti-smith, alpha 1 antitrypsin, ANA ordered  - s/p diagnostic and therapetutic paracentesis in ED, drained ~500 cc; SAAG 1.2- likely 2/2 portal HTN  - LDH, total protein, albumin, fluid culture/gm stain ordered; gm stain - no organisms seen, no PMNs  - Child-pugh score 7, class B  - MELD score 21, 19.6% mortality     ESRD, MWF dialysis  Hyperkalemia   - HD today  - L chest port for access  - continue home renvela 800 mg TID    Chronic medical conditions:  - HTN: continue home coreg 25 mg BID, hydralazine 100 mg TID  - asthma: continue home albuterol     DVT/PE Prophylaxis: SCDs/ Venodynes/Impulse boots    Disposition Planning: Home discharge        Kandis Fantasia, MD  10/05/2016, 21:06  PGY-2 Internal Medicine  [facility]      I saw and examined the patient.  I reviewed the resident's note.  I agree with the findings and plan of care as documented in the resident's note.  Any exceptions/additions are edited/noted.    Reita May, MD

## 2016-10-05 NOTE — Nurses Notes (Addendum)
Pt refusing Lab draw Service Paged

## 2016-10-05 NOTE — Nurses Notes (Signed)
Pt back to room from Dialysis Vo[facility] no complains No signs of distress noted

## 2016-10-05 NOTE — Nurses Notes (Signed)
Patient is complaining of difficulty breathing. Oxygen saturations 98-100% on 2.5 L NC. Breathing pattern appears unchanged from previous. HR remains 80s-90s. Patient is requesting RT come to bedside and give breathing treatment. Respiratory therapy paged. Will continue to monitor.

## 2016-10-05 NOTE — Nurses Notes (Signed)
IVs removed per LDAW flow sheet. Patient belongings at bedside. Wheelchair transport ordered per patient request to assist to car.

## 2016-10-05 NOTE — Progress Notes (Signed)
Pt requested to leave AMA at this time. I recommended and offered continued admission to the patient for further evaluation and treatment.  Risk of worsening illness, long term disability, or even death as a result of declining staying in admission discussed with and understood by patient.  Patient demonstrates adequate comprehension and decision making capacity.  Patient stated understanding of why, as the treating physician, I have recommended staying admitted and the possible adverse outcomes of declining such at this time.  All questions were answered.  The patient is encouraged to return to the Emergency Department at any time, and for any reason, to be re-evaluated.      Vaughan BastaJustine Falcone, MD 10/05/2016, 23:05  PGY-1 Emergency Medicine  [facility] [employer]  [phone]

## 2016-10-05 NOTE — Nurses Notes (Addendum)
Patient is refusing to give up home albuterol inhaler even when PRN Albuterol is available.   Service to place MD to nurse note stating okay to keep home inhaler.  Patient argues with staff and is noncompliant with some aspects of care. Patient refuses to lay in bed instead insists to sit upright at side of bed while falling in and out of sleep. Staff has repeatedly asked patient to sit in bed properly for safety. Patient continues to argue.

## 2016-10-05 NOTE — Nurses Notes (Signed)
Pt refusing AM  B/P meds stated He would take after Dialysis

## 2016-10-05 NOTE — Nurses Notes (Signed)
Service came to bedside to speak to patient. Todd GuthrieJ. Falcone recommended staying in hospital and continuing treatment at this time. Patient adamant about leaving. AMA form signed by patient, doctor and nurse and placed in chart.

## 2016-10-05 NOTE — Nurses Notes (Addendum)
Patient told about MPS order and to be NPO at midnight. Patient asked about pain regimen in place. Nursing explained pain medication changed to PO Oxycodone IR Q 4. Patient then stated that he could not take pills as he had been vomiting and had thrown up dinner minutes ago. Patient said he had flushed vomit. No vomit observed and patient had not stated he had been vomiting with previous assessment. Patient refused Tigan administration. Service paged.     Patient also has removed telemetry and pulse ox monitoring.

## 2016-10-05 NOTE — Nurses Notes (Signed)
Patient is demanding to speak to a doctor about pain regimen. Patient refused glucose check and hydralazine. Once time dose of IV Dilaudid administered. Service notified and to come to bedside.

## 2016-10-05 NOTE — ED Nurses Note (Signed)
0014: FS 78. Admitting residents completed paracentesis. Pt still leaking from site. Per MD instructions, ace bandages and abd pads placed on site.

## 2016-10-05 NOTE — Nurses Notes (Signed)
10/05/16 0327   Vital Signs   BP (Non-Invasive) (!) 159/101   MAP (Non-Invasive) 118 mmHG     Patient also requesting a higher dose of pain medication than the 0.4 mg q 3 hr.  Vaughan BastaJustine Falcone from service notified.

## 2016-10-05 NOTE — ED Nurses Note (Signed)
0227: FS 155. [facility] MD order [facility] RN on 8W updated. Pt teletracked

## 2016-10-05 NOTE — ED Nurses Note (Signed)
0105: patient refused US due to abdominal dressing.

## 2016-10-05 NOTE — Nurses Notes (Signed)
Patient is complaining 10/10 chest and ab pain. refusing nitro, dilaudid prn given. satting 100% 2.5 l nc. HR 90S NS rhythm. [name]. Falcone from service notified.   Patient currently allowing telemetry and oxygen monitor to be worn and allowed phlebotomy to obtain troponin.   Will continue to monitor.     Spoke with service. Patient to get MPS test and made NPO.

## 2016-10-06 ENCOUNTER — Inpatient Hospital Stay (HOSPITAL_COMMUNITY): Payer: Medicare Other | Admitting: Internal Medicine

## 2016-10-06 ENCOUNTER — Encounter (HOSPITAL_COMMUNITY): Payer: Self-pay

## 2016-10-06 ENCOUNTER — Emergency Department (EMERGENCY_DEPARTMENT_HOSPITAL): Payer: Medicare Other

## 2016-10-06 ENCOUNTER — Inpatient Hospital Stay (EMERGENCY_DEPARTMENT_HOSPITAL)
Admission: EM | Admit: 2016-10-06 | Discharge: 2016-10-07 | Disposition: A | Discharge status: Home / Self Care | Payer: Medicare Other | Source: Other Acute Inpatient Hospital | Attending: Internal Medicine | Admitting: Internal Medicine

## 2016-10-06 ENCOUNTER — Emergency Department (HOSPITAL_COMMUNITY): Payer: Medicare Other

## 2016-10-06 DIAGNOSIS — J90 Pleural effusion, not elsewhere classified: Secondary | ICD-10-CM

## 2016-10-06 DIAGNOSIS — R109 Unspecified abdominal pain: Secondary | ICD-10-CM

## 2016-10-06 DIAGNOSIS — R1110 Vomiting, unspecified: Secondary | ICD-10-CM

## 2016-10-06 DIAGNOSIS — N186 End stage renal disease: Secondary | ICD-10-CM

## 2016-10-06 DIAGNOSIS — I2510 Atherosclerotic heart disease of native coronary artery without angina pectoris: Secondary | ICD-10-CM

## 2016-10-06 DIAGNOSIS — Z992 Dependence on renal dialysis: Secondary | ICD-10-CM

## 2016-10-06 DIAGNOSIS — I208 Other forms of angina pectoris: Secondary | ICD-10-CM | POA: Diagnosis present

## 2016-10-06 DIAGNOSIS — R34 Anuria and oliguria: Secondary | ICD-10-CM

## 2016-10-06 DIAGNOSIS — K7460 Unspecified cirrhosis of liver: Secondary | ICD-10-CM

## 2016-10-06 DIAGNOSIS — I4581 Long QT syndrome: Secondary | ICD-10-CM

## 2016-10-06 DIAGNOSIS — I129 Hypertensive chronic kidney disease with stage 1 through stage 4 chronic kidney disease, or unspecified chronic kidney disease: Secondary | ICD-10-CM

## 2016-10-06 DIAGNOSIS — J45909 Unspecified asthma, uncomplicated: Secondary | ICD-10-CM

## 2016-10-06 DIAGNOSIS — I517 Cardiomegaly: Secondary | ICD-10-CM

## 2016-10-06 DIAGNOSIS — Z955 Presence of coronary angioplasty implant and graft: Secondary | ICD-10-CM

## 2016-10-06 DIAGNOSIS — A0471 Enterocolitis due to Clostridium difficile, recurrent: Secondary | ICD-10-CM

## 2016-10-06 DIAGNOSIS — R197 Diarrhea, unspecified: Secondary | ICD-10-CM

## 2016-10-06 DIAGNOSIS — R079 Chest pain, unspecified: Secondary | ICD-10-CM

## 2016-10-06 DIAGNOSIS — D6959 Other secondary thrombocytopenia: Secondary | ICD-10-CM | POA: Insufficient documentation

## 2016-10-06 DIAGNOSIS — R9431 Abnormal electrocardiogram [ECG] [EKG]: Secondary | ICD-10-CM

## 2016-10-06 LAB — VENOUS BLOOD GAS/LACTATE/CO-OX/LYTES (NA/K/CA/CL/GLUC) - ORS ONLY
BASE DEFICIT: 0.5 mmol/L (ref ?–3.0)
BICARBONATE (VENOUS): 23.7 mmol/L (ref 22.0–26.0)
CARBOXYHEMOGLOBIN: 3.1 % — ABNORMAL HIGH (ref 0.0–2.5)
CHLORIDE: 104 mmol/L (ref 96–111)
GLUCOSE: 85 mg/dL (ref 60–105)
HEMOGLOBIN: 8.4 g/dL — ABNORMAL LOW (ref 12.0–18.0)
IONIZED CALCIUM: 0.92 mmol/L — ABNORMAL LOW (ref 1.10–1.36)
LACTATE: 0.7 mmol/L (ref 0.0–1.3)
MET-HEMOGLOBIN: 0.9 % (ref 0.0–3.5)
O2CT: 5.5 % — ABNORMAL LOW (ref 6.7–17.5)
OXYHEMOGLOBIN: 46.3 % (ref 40.0–80.0)
PCO2 (VENOUS): 50 mmHg (ref 41.00–51.00)
PH (VENOUS): 7.32 (ref 7.31–7.41)
PO2 (VENOUS): 22 mmHg — ABNORMAL LOW (ref 35.0–50.0)
SODIUM: 134 mmol/L — ABNORMAL LOW (ref 136–145)
WHOLE BLOOD POTASSIUM: 6.4 mmol/L (ref 3.5–5.0)

## 2016-10-06 LAB — CBC WITH DIFF
BASOPHIL #: 0.04 x10ˆ3/uL (ref 0.00–0.20)
BASOPHIL %: 1 %
EOSINOPHIL #: 0.44 x10ˆ3/uL (ref 0.00–0.50)
EOSINOPHIL %: 9 %
HCT: 26.1 % — ABNORMAL LOW (ref 36.7–47.0)
HGB: 8.6 g/dL — ABNORMAL LOW (ref 12.5–16.3)
LYMPHOCYTE #: 0.32 x10ˆ3/uL — ABNORMAL LOW (ref 1.00–4.80)
LYMPHOCYTE %: 6 %
MCH: 31.1 pg (ref 27.4–33.0)
MCHC: 32.8 g/dL (ref 32.5–35.8)
MCV: 94.9 fL (ref 78.0–100.0)
MONOCYTE #: 0.34 x10ˆ3/uL (ref 0.30–1.00)
MONOCYTE %: 7 %
MPV: 8.2 fL (ref 7.5–11.5)
NEUTROPHIL #: 3.9 x10ˆ3/uL (ref 1.50–7.70)
NEUTROPHIL %: 78 %
PLATELETS: 96 10*3/uL — ABNORMAL LOW (ref 140–450)
PLATELETS: 96 x10ˆ3/uL — ABNORMAL LOW (ref 140–450)
RBC: 2.76 x10ˆ6/uL — ABNORMAL LOW (ref 4.06–5.63)
RDW: 16.9 % — ABNORMAL HIGH (ref 12.0–15.0)
WBC: 5 x10ˆ3/uL (ref 3.5–11.0)

## 2016-10-06 LAB — ANA (ANTINUCLEAR ANTIBODIES), SERUM
ANTI-NUCLEAR ANTIBODIES,QUALITATIVE: NEGATIVE
ANTI-NUCLEAR ANTIBODIES,QUANTITATIVE: 0.35 {index_val} (ref <=0.90)

## 2016-10-06 LAB — CLOSTRIDIUM DIFFICILE TOXIN DETECTION
CLOSTRIDIUM DIFFICILE TOXIN DETECTION: NEGATIVE
CLOSTRIDIUM DIFFICILE TOXIN DETECTION: NEGATIVE

## 2016-10-06 LAB — HEPATIC FUNCTION PANEL
ALBUMIN: 2.9 g/dL — ABNORMAL LOW (ref 3.5–5.0)
ALKALINE PHOSPHATASE: 238 U/L — ABNORMAL HIGH (ref <150)
ALT (SGPT): 14 U/L (ref <55)
AST (SGOT): 21 U/L (ref 8–48)
BILIRUBIN DIRECT: 0.3 mg/dL — ABNORMAL HIGH (ref <0.3)
BILIRUBIN TOTAL: 0.6 mg/dL (ref 0.3–1.3)
PROTEIN TOTAL: 5.7 g/dL — ABNORMAL LOW (ref 6.4–8.3)

## 2016-10-06 LAB — ECG 12-LEAD
Atrial Rate: 85 {beats}/min
Atrial Rate: 81 {beats}/min
Calculated P Axis: 68 degrees
Calculated P Axis: 53 degrees
Calculated R Axis: -2 degrees
Calculated T Axis: 80 degrees
Calculated T Axis: 89 degrees
PR Interval: 208 ms
QRS Duration: 92 ms
QRS Duration: 92 ms
QT Interval: 414 ms
QT Interval: 422 ms
QTC Calculation: 492 ms
Ventricular rate: 85 {beats}/min

## 2016-10-06 LAB — PT/INR
INR: 1.11 (ref 0.80–1.20)
PROTHROMBIN TIME: 12.9 s (ref 9.3–13.9)

## 2016-10-06 LAB — PTT (PARTIAL THROMBOPLASTIN TIME): APTT: 35.5 s (ref 25.1–36.5)

## 2016-10-06 LAB — TROPONIN-I (FOR ED ONLY): TROPONIN I: 87 ng/L (ref 0–30)

## 2016-10-06 LAB — LIPASE: LIPASE: 30 U/L (ref 10–80)

## 2016-10-06 LAB — VENOUS BLOOD GAS/LACTATE/CO-OX/LYTES (NA/K/CA/CL/GLUC): %FIO2 (VENOUS): 32 %

## 2016-10-06 LAB — TROPONIN-I: TROPONIN I: 86 ng/L (ref 0–30)

## 2016-10-06 MED ORDER — SODIUM CHLORIDE 0.9 % (FLUSH) INJECTION SYRINGE
2.0000 mL | INJECTION | Freq: Three times a day (TID) | INTRAMUSCULAR | Status: DC
Start: 2016-10-06 — End: 2016-10-07
  Administered 2016-10-06 – 2016-10-07 (×3): 0 mL

## 2016-10-06 MED ORDER — TRIMETHOBENZAMIDE 100 MG/ML INTRAMUSCULAR SOLUTION
200.00 mg | Freq: Four times a day (QID) | INTRAMUSCULAR | Status: DC | PRN
Start: 2016-10-06 — End: 2016-10-07
  Administered 2016-10-07: 0 mg via INTRAMUSCULAR
  Filled 2016-10-06: qty 2

## 2016-10-06 MED ORDER — ONDANSETRON HCL (PF) 4 MG/2 ML INJECTION SOLUTION
4.00 mg | INTRAMUSCULAR | Status: AC
Start: 2016-10-06 — End: 2016-10-06
  Administered 2016-10-06: 4 mg via INTRAVENOUS
  Filled 2016-10-06 (×2): qty 2

## 2016-10-06 MED ORDER — ALBUTEROL SULFATE HFA 90 MCG/ACTUATION AEROSOL INHALER - RN
2.00 | Freq: Four times a day (QID) | RESPIRATORY_TRACT | Status: DC | PRN
Start: 2016-10-06 — End: 2016-10-07
  Filled 2016-10-06: qty 8

## 2016-10-06 MED ORDER — NITROGLYCERIN 0.4 MG SUBLINGUAL TABLET
0.40 mg | SUBLINGUAL_TABLET | SUBLINGUAL | Status: DC | PRN
Start: 2016-10-06 — End: 2016-10-07

## 2016-10-06 MED ORDER — ALBUTEROL SULFATE 2.5 MG/3 ML (0.083 %) SOLUTION FOR NEBULIZATION
2.50 mg | INHALATION_SOLUTION | RESPIRATORY_TRACT | Status: DC | PRN
Start: 2016-10-06 — End: 2016-10-07

## 2016-10-06 MED ORDER — HYDROMORPHONE 2 MG/ML INJECTION SYRINGE
0.4000 mg | INJECTION | INTRAMUSCULAR | Status: AC
Start: 2016-10-06 — End: 2016-10-06
  Administered 2016-10-06: 0.4 mg via INTRAVENOUS
  Filled 2016-10-06: qty 1

## 2016-10-06 MED ORDER — DEXTROSE 50 % IN WATER (D50W) INTRAVENOUS SYRINGE
25.0000 g | INJECTION | INTRAVENOUS | Status: DC
Start: 2016-10-06 — End: 2016-10-06

## 2016-10-06 MED ORDER — PREMIXED HEMODIALYSATE
Status: AC
Start: 2016-10-06 — End: 2016-10-06

## 2016-10-06 MED ORDER — HEPARIN (PORCINE) 5,000 UNIT/ML INJECTION SOLUTION
5000.0000 [IU] | Freq: Three times a day (TID) | INTRAMUSCULAR | Status: DC
Start: 2016-10-07 — End: 2016-10-07
  Administered 2016-10-07: 0 [IU] via SUBCUTANEOUS
  Filled 2016-10-06 (×4): qty 1

## 2016-10-06 MED ORDER — PERFLUTREN LIPID MICROSPHERES 1.1 MG/ML INTRAVENOUS SUSPENSION
0.30 mL | INTRAVENOUS | Status: DC
Start: 2016-10-06 — End: 2016-10-06

## 2016-10-06 MED ORDER — SODIUM CHLORIDE 0.9 % (FLUSH) INJECTION SYRINGE
2.0000 mL | INJECTION | INTRAMUSCULAR | Status: DC | PRN
Start: 2016-10-06 — End: 2016-10-07

## 2016-10-06 MED ORDER — HYDRALAZINE 50 MG TABLET
100.00 mg | ORAL_TABLET | Freq: Three times a day (TID) | ORAL | Status: DC
Start: 2016-10-07 — End: 2016-10-07
  Administered 2016-10-07: 0 mg via ORAL
  Administered 2016-10-07: 100 mg via ORAL
  Filled 2016-10-06 (×4): qty 2

## 2016-10-06 MED ORDER — HYDROMORPHONE 2 MG/ML INJECTION SYRINGE
0.20 mg | INJECTION | INTRAMUSCULAR | Status: DC
Start: 2016-10-06 — End: 2016-10-06

## 2016-10-06 MED ORDER — SODIUM CITRATE 4% INJECTABLE
2.0000 | INJECTION | Status: AC
Start: 2016-10-06 — End: 2016-10-06
  Administered 2016-10-06: 2
  Filled 2016-10-06 (×2): qty 5

## 2016-10-06 MED ORDER — HYDRALAZINE 50 MG TABLET
100.0000 mg | ORAL_TABLET | ORAL | Status: AC
Start: 2016-10-06 — End: 2016-10-06
  Administered 2016-10-06: 100 mg via ORAL
  Filled 2016-10-06: qty 2

## 2016-10-06 MED ORDER — INSULIN U-100 REGULAR HUMAN 100 UNIT/ML INJECTION SOLUTION
10.0000 [IU] | Freq: Once | INTRAMUSCULAR | Status: DC
Start: 2016-10-06 — End: 2016-10-06

## 2016-10-06 MED ORDER — TRIMETHOBENZAMIDE 100 MG/ML INTRAMUSCULAR SOLUTION
200.0000 mg | INTRAMUSCULAR | Status: DC
Start: 2016-10-06 — End: 2016-10-06
  Administered 2016-10-06: 0 mg via INTRAMUSCULAR
  Filled 2016-10-06: qty 2

## 2016-10-06 MED ORDER — VANCOMYCIN 50 MG/ML ORAL LIQUID WITH FLAVORING
125.0000 mg | ORAL | Status: AC
Start: 2016-10-06 — End: 2016-10-06
  Administered 2016-10-06: 125 mg via ORAL
  Filled 2016-10-06: qty 3

## 2016-10-06 MED ORDER — PERFLUTREN LIPID MICROSPHERES 1.1 MG/ML INTRAVENOUS SUSPENSION
0.30 mL | INTRAVENOUS | Status: DC
Start: 2016-10-07 — End: 2016-10-07

## 2016-10-06 MED ORDER — HYDROMORPHONE 2 MG TABLET
1.00 mg | ORAL_TABLET | ORAL | Status: DC | PRN
Start: 2016-10-06 — End: 2016-10-07
  Administered 2016-10-06 – 2016-10-07 (×2): 1 mg via ORAL
  Administered 2016-10-07 (×2): 0 mg via ORAL
  Filled 2016-10-06 (×4): qty 1

## 2016-10-06 MED ORDER — SEVELAMER CARBONATE 800 MG TABLET
800.00 mg | ORAL_TABLET | Freq: Three times a day (TID) | ORAL | Status: DC
Start: 2016-10-07 — End: 2016-10-07
  Administered 2016-10-07: 0 mg via ORAL
  Filled 2016-10-06 (×3): qty 1

## 2016-10-06 MED ORDER — ONDANSETRON HCL 2 MG/ML INTRAVENOUS SOLUTION
4.00 mg | Freq: Three times a day (TID) | INTRAVENOUS | Status: DC | PRN
Start: 2016-10-06 — End: 2016-10-06

## 2016-10-06 MED ORDER — ONDANSETRON HCL 2 MG/ML INTRAVENOUS SOLUTION
4.0000 mg | Freq: Once | INTRAVENOUS | Status: DC
Start: 2016-10-06 — End: 2016-10-06

## 2016-10-06 MED ORDER — CARVEDILOL 25 MG TABLET
25.00 mg | ORAL_TABLET | Freq: Two times a day (BID) | ORAL | Status: DC
Start: 2016-10-07 — End: 2016-10-07
  Administered 2016-10-07: 0 mg via ORAL
  Filled 2016-10-06 (×2): qty 1

## 2016-10-06 MED ADMIN — lactated Ringers intravenous solution: INTRAVENOUS | @ 21:00:00 | NDC 00338011704

## 2016-10-06 MED ADMIN — lactated Ringers intravenous solution: ARTERIOVENOUS_FISTULA | @ 19:00:00

## 2016-10-06 NOTE — ED Nurses Note (Signed)
Pt informed potassium is high and will need the insulin and dextrose combo and stating he is refusing to receive them even with provider at bedside stating we can give him two doses of the dextrose, provider giving verbal order to hold meds at this time and nephrology will be consulted.

## 2016-10-06 NOTE — Nurses Notes (Signed)
10/06/16 1830   Pain   Pain Assessment Assessment   Pain Scale Used N   Pain Score (Numeric, Faces) 10   Location CHEST     Dr. Bennye Almlemetson at bedside and evaluated patient. New orders placed for pain medication. Will continue to monitor.

## 2016-10-06 NOTE — Nurses Notes (Signed)
Hemodialysis tx completed.  3 kg removed.  Patient ate full dinner tray.  Patient with complaints of nausea, and vomited small amount light brown emesis.  Zofran given IV. Patient stated "relief".  Patient with cramps in hands end of tx.  Patient back to ED post tx in stable condition.  Report given.

## 2016-10-06 NOTE — ED Nurses Note (Signed)
Pt reports that potassium was high when he was here and was given the combination of insulin and dextrose to treat and reports it bottoms out his blood glucose. Resident aware of potassium 6.4

## 2016-10-06 NOTE — ED Nurses Note (Signed)
Voice care called at this time, awaiting bed to be cleaned.

## 2016-10-06 NOTE — Incoming ED Transfer Note (Signed)
[facility] Pt missed last 3 dialysis treatments. LUE fistula. Mild ileus suspected.     20G PIV Right upper arm  5 mg Metoprolol for elevated BP  4 mg zofran  0.5 mg dilaudid  30 mg kayexulate  324 mg aspirin  3 SL nitro    RBC 2.7  HGB 8.4  K+ 5.7  Creatinine 8.4  Trop 0.073  CK 1861

## 2016-10-06 NOTE — Discharge Summary (Signed)
[facility]  DISCHARGE SUMMARY      PATIENT NAME:  [name], [name]  MRN:  [id number]  DOB:  13-Apr-1971    ENCOUNTER DATE:  10/04/2016  INPATIENT ADMISSION DATE: 10/05/2016  DISCHARGE DATE:  10/05/16    ATTENDING PHYSICIAN:Dr. Narda Amber   SERVICE: NEPHROLOGY  PRIMARY CARE PHYSICIAN: No Pcp     Reason for Admission     Diagnosis    Chest pain [125822]          DISCHARGE DIAGNOSIS:     Principal Problem: chest pain     Active Hospital Problems    Diagnosis Date Noted   . Chest pain 09/12/2016      Resolved Hospital Problems    Diagnosis    No resolved problems to display.     Active Non-Hospital Problems    Diagnosis Date Noted   . Anemia in CKD (chronic kidney disease) 09/12/2016   . Smoker 09/12/2016   . Volume overload 08/26/2016   . ESRD (end stage renal disease) on dialysis (HCC) 08/26/2016   . Noncompliance with renal dialysis (HCC) 08/26/2016   . C. difficile diarrhea 08/26/2016   . Accelerated hypertension 08/26/2016      Allergies   Allergen Reactions   . Lisinopril      Lip swelling     . Morphine      Throat swelling     . Toradol [Ketorolac]      Breathing issues        DISCHARGE MEDICATIONS:     Current Discharge Medication List      CONTINUE these medications - NO CHANGES were made during your visit.       Details    albuterol sulfate 90 mcg/Actuation HFA Aerosol Inhaler oral inhaler    1-2 Puffs, Inhalation, Q6H PRN   Refills:  0       carvedilol 25 mg Tablet   Commonly known as:  COREG    25 mg, Oral, 2x/day-Food   Refills:  0       furosemide 80 mg Tablet   Commonly known as:  LASIX    80 mg, Oral, Daily   Refills:  0       hydrALAZINE 100 mg Tablet   Commonly known as:  APRESOLINE    100 mg, Oral, 3x/day   Refills:  0       RENAGEL 800 mg Tablet   Generic drug:  sevelamer    800 mg, Oral, 3x/day-Meals   Refills:  0           Discharge med list refreshed?  YES    DISCHARGE INSTRUCTIONS:    No discharge procedures on file.       REASON FOR HOSPITALIZATION AND HOSPITAL COURSE:  This is a 46 y.o., male  with PMH of CAD with MI (2010, 2 stents, no longer on aspirin Plavix), ESRD on MWF dialysis, HTN, asthma, ascites sec to cirrhosis who presented to [facility] on 6/18 with chest pain, abdominal pain, diarrhea, and vomiting. For his atypical chest pain, troponins were negative and no acute ischemic changes were noted on EKG. TTE was ordered however patient refused. MPS was ordered due to cardiac history and acute onset of chest pain, however since he left AMA, this was not done. For his abdominal pain and diarrhea, he was afebrile with no leukocytosis and C diff was ordered but never sent. 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 CTA C/A/P showed sequale of cirrhosis and  portal HTN and RUQ US showed hepatomegaly with no fatty change nor focal lesions evident; slightly lobular contours with mild coarsening of the echotexture which can be seen with cirrhosis; no biliary duct dilation; and ascites remains evident. His LFTs were WNL and smooth muscle, anti-smith, alpha 1 antitrypsin, ANA ordered. He underwent diagnostic and therapeutic paracentesis in ED where about 500 cc with SAAG 1.2 likely 2/2 portal HTN. He was started on broad spectrum antibiotics with IV vancomycin and cefepime. Patient received HD resuming his regular HD schedule. He was perisistently requesting pain meds. Shortly after his admission, he requested to leave AMA. He was instructed to remain in patient for further evaluation. Risk of worsening illness, long term disability, or even death as a result of declining staying in admission discussed with and understood by patient. Patient demonstrated adequate comprehension and decision making capacity. He was encouraged to return to the Emergency Department at any time, and for any reason, to be re-evaluated.    CONDITION ON DISCHARGE:  A. Ambulation: Full ambulation  B. Self-care Ability: Complete  C. Cognitive Status Alert and Oriented x 3  D. DNR status at discharge: Full Code     Advance Directive Information       Most Recent Value    Does the Patient have an Advance Directive? Yes, Patient Does Have Advance Directive for Healthcare Treatment    Copy of Advance Directives in Chart? No, Copy Requested From Patient          DISCHARGE DISPOSITION:  Home discharge and AMA      Kandis FantasiaZakeih Chaker, MD  10/06/2016, 16:51  PGY-2 Internal Medicine  [facility]      Copies sent to Care Team       Relationship Specialty Notifications Start End    Pcp, No PCP - General   10/06/16           Referring providers can utilize [email or web address] to access their referred [facility] patient's information.

## 2016-10-06 NOTE — ED Nurses Note (Signed)
Pt to dialysis with RN transport, pt belongings, transfer papers and stickers to dialysis with him incase he gets an assigned bed.

## 2016-10-06 NOTE — ED Nurses Note (Signed)
Transport here to transport patient to dialysis. Patient to dialysis via bed with RN.

## 2016-10-06 NOTE — ED Nurses Note (Signed)
Trop collected and sent to the lab

## 2016-10-06 NOTE — H&P (Signed)
NEPHROLOGY - ESRD  History & Physical     Name: Todd, [name], 46 y.o. male Date of Service:  10/06/2016   Date of Birth:  03-21-1971 Date of Admission:  10/06/2016   PCP: No Pcp Attending: Reita May, MD   MRN: [id number]  Information Obtained from: Patient and EMR  History Limitations: None Chief Complaint: Diarrhea, N/V, Chest pain  Admitted for: Chest pain r/o & Hyperkalemia  Code Status: Full Code     HISTORY OF PRESENTING ILLNESS:     [name] is a 46 y.o. male who presented to the ED after leaving AMA overnight complaining of persistent abdominal pain, chest pain, nausea, vomiting and diarrhea.     Diarrhea began on Friday. Associated with constant abdominal pain worse with BMs and non-bloody, non-bilious vomiting. Episodes of 7-8 times per day. 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 He was told he should be evaluated for fecal transplantation, or possibly colectomy. He also reports bright red blood mixed in with stool. He reports recently completing a month long pulse c diff course and had two days of soft formed stools before diarrhea started again. I wasn't really able to describe the pulse abx course to me so it's unclear whether he completed it or took it appropriately. However, he is now negative for c. Diff based on stool that was collected last night.     Chest pain is described as a constant pressure like pain with no aggravating or alleviating factors, no radiation. He is a current smoker and has a h/o CAD s/p stents. No FHx of early cardiac disease.     Yesterday he refused TTE and left AMA due to perceived inadequate pain control.     Again found to be hyperkalemic in the ED despite getting HD on admission yesterday.      MEDICAL HISTORY:     PAST MEDICAL & SURGICAL HISTORIES:   Past Medical History:   Diagnosis Date   . Ascites    . Asthma    . Clostridium  difficile infection 08/28/2016    +  C Diff 08/2016   . Dialysis patient (HCC)    . HTN (hypertension)    . Kidney failure    . MI, old     Past Surgical History:   Procedure Laterality Date   . AV FISTULA PLACEMENT     . HX CORONARY STENT PLACEMENT     . HX HERNIA REPAIR          HOME MEDICATIONS:  Medications Prior to Admission     Prescriptions    albuterol sulfate 90 mcg/Actuation Inhalation HFA Aerosol Inhaler oral inhaler    Take 1-2 Puffs by inhalation Every 6 hours as needed    carvedilol (COREG) 25 mg Oral Tablet    Take 25 mg by mouth Twice daily with food    furosemide (LASIX) 80 mg Oral Tablet    Take 80 mg by mouth Once a day    hydrALAZINE (APRESOLINE) 100 mg Oral Tablet    Take 100 mg by mouth Three times a day    sevelamer (RENAGEL) 800 mg Oral Tablet    Take 800 mg by mouth Three times daily with meals           ALLERGIES:  He is allergic to lisinopril; morphine; and toradol [ketorolac].     FAMILY HISTORY:  His family  history includes Healthy in his father; Hypertension in his mother.    SOCIAL HISTORY:  He  reports that he does not drink alcohol. He  reports that he has been smoking Cigarettes.  He has been smoking about 0.25 packs per day. He has never used smokeless tobacco. He  reports that he does not use illicit drugs.    ROS:      Constitutional -  Negative for: fevers, chills, weight loss, malaise, fatigue and sweats  HEENT -  Negative for: visual disturbance, eye redness, hearing loss, earaches, nasal congestion, sore throat, post nasal drip and dysphagia  Respiratory - Positive for: dyspnea on exertion Negative for: cough, sputum and hemoptysis  Cardiovascular - Positive for: chest pain, lower extremity edema Negative for: DOE, PND, orthopnea and palpitations  Gastrointestinal - Positive for: nausea, vomiting, diarrhea, abdominal pain, blood in stool Negative for: constipation  Genitourinary -  Negative for: dysuria  and hematuria  Integument/breast -  Negative for: rashes and pruritis   Hematologic/lymphatic -  Negative for: easy bruising and lymphadenopathy  Musculoskeletal -  Negative for: myalgias and arthralgias  Neurological -  Negative for: headaches, lightheadedness and weakness  Behavioral/Psych -  Negative for: anxiety and depressed mood  Endocrine -  Negative for: polydipsia and temperature intolerance  Allergic/Immunologic -  Negative for: anphylaxis and urticaria     EXAMINATION:     Temperature: 36.6 C (97.9 F) Heart Rate: 89 BP (Non-Invasive): (!) 185/113   Respiratory Rate: 18 SpO2-1: 98 % Pain Score (Numeric, Faces): 8         General: Well-nourished, well-developed, in no apparent distress.   Head: NC/AT  Eyes: PERRLA, EOMI, sclareae non-icteric, conjunctivae clear  Throat: Oropharynx clear, mucous membranes moist.  Neck: Trachea midline.  Cardiovascular: RRR, no murmurs, rubs, gallops.  Respiratory: CTABL, no wheezes, rhonchi, crackles.  Abdominal: BS normal; abdomen soft, Diffuse abdominal tenderness on palpation.   Extremities: Trace edema.   Skin:Warm and dry.  Neurological: AOx3  Psychiatric: Normal mood & affect. Thought process linear. Speech content normal     LABS:       CBC Differential   Recent Labs      10/04/16   1555  10/05/16   0402  10/06/16   1630   WBC  5.4  4.2  5.0   HGB  8.1*  8.3*  8.6*   HCT  24.6*  25.4*  26.1*   PLTCNT  107*  97*  96*    Recent Labs      10/04/16   1555  10/05/16   0402  10/06/16   1630   PMNS  80  76  78   LYMPHOCYTES  5  7  6    MONOCYTES  7  7  7    EOSINOPHIL  8  9  9    BASOPHILS  1  0.03  1  0.03  1  0.04   PMNABS  4.34  3.20  3.90   LYMPHSABS  0.28*  0.31*  0.32*   MONOSABS  0.35  0.30  0.34   EOSABS  0.45  0.38  0.44      BMP LFTs   Recent Labs      10/05/16   2037  10/06/16   1630   SODIUM   --   134*   CHLORIDE   --   104   ALBUMIN  2.9*   --      Recent Labs      10/04/16  1555  10/04/16   1655  10/04/16   2206  10/05/16   0402  10/05/16   2037   CALCIUM   --   7.2*  7.1*  6.9*   --    ALBUMIN  3.1*   --    --    --   2.9*    MAGNESIUM  2.3  2.0   --   2.0   --    PHOSPHORUS  5.9*  6.0*   --   5.9*   --     Recent Labs      10/05/16   2037   TOTALPROTEIN  5.7*   ALBUMIN  2.9*   TOTBILIRUBIN  0.6   BILIRUBINCON  0.3*   AST  21   ALT  14   ALKPHOS  238*   LIPASE  30      CoAgs Blood Gas:   Recent Labs      10/06/16   1630   PROTHROMTME  12.9   INR  1.11   APTT  35.5    Recent Labs      10/06/16   1630   FI02  32.0   PH  7.32   PCO2  50.00   PO2  22.0*   BICARBONATE  23.7   BASEDEFICIT  0.5       Cardiac Markers Lipid Panel   Recent Labs      10/04/16   1556   10/05/16   0754  10/05/16   2037  10/06/16   1630   TROPONINI   --    < >  76*  78*  87*   BNP  2738*   --    --    --    --     < > = values in this interval not displayed.    No results found for this encounter   Urine Analysis Other Labs   Recent Labs      10/06/16   1630   GLUCOSE  85    No results found for this encounter    Invalid input(s): PRL     IMAGING / STUDIES:      XR AP MOBILE CHEST   Final Result   Stable aeration with findings again concerning for early pulmonary edema   including mixed airspace/interstitial opacities, cardiomegaly and small   bilateral pleural effusions.           EKG: Peaked T-waves. NSR.      MICROBIOLOGY / PATHOLOGY:    C diff - Negative    ASSESSMENT:     [name] is a 46 y.o. male with PMH of CAD complicated by MI s/p stents, Recurrent C. Diff colitis, HTN, ESRD on HD MWF. He was admitted to the ESRD service for chest pain rule out, diarrhea/abdominal pain, hyperkalemia.      PLAN:    Diarrhea  Recurrent C. Diff (Now Negative)  Nausea/Vomiting  Abdominal Pain   He reports completing month long pulse course of abx that ID recommended on 09/14/16 - 500 mg QID x 2 weeks, 500 mg BID x 2 weeks, 500 mg daily for 4 days, 500 mg every other day for 4 doses.   Pending referral to fecal transplant center for more definitive treatment.    Hold off on restarting vanc at the moment    Tigan for nausea   Dilaudid PO for pain control     Chest  Pain  CAD s/p Stents  HTN  Trending troponin   Stress test ordered for tomorrow   TTE ordered   Continuous telemetry   NGT prn for pain   Hydralazine 100 mg TID + Coreg 25 mg BID     ESRD  Anuria  Hyperkalemia   Outpatient Dialysis: MWF via TCC   HD today   Continue home renvela 800 mg TID     H/o Ascites  Portal HTN  Cirrhosis Unknown etiology   Follow up cultures/studies got during last admission   Afebrile, no leukocytosis, Peritoneal Cx (10/04/16) NGTD x 2 days -- Low suspicion for SBP will hold off on abx at this time.     - Chronic Medical Conditions Managed During This Admission -    [place] - Continue home albuterol  ___     DVT/PE Prophylaxis - Heparin     Consults - none   Hardware (Lines, Drains, Foley, Tubes) - PIV and TCC   Diet - Regular   Activity  Precautions - Up w/ assistance and Up in chair TID w/ all meals   Therapy - None   Disposition Planning - TBD; Likely Home discharge     Greggory Brandymily Clemetson - 10/06/2016   Greggory BrandyEmily Clemetson, MD  [facility]  Internal Medicine, PGY-2  Pager # [phone]  -----    I saw and examined the patient.  I reviewed the resident's note.  I agree with the findings and plan of care as documented in the resident's note.  Any exceptions/additions are edited/noted.    Reita MaySudhir Joliyah Lippens, MD

## 2016-10-06 NOTE — ED Nurses Note (Signed)
Paged admitting service due to lab draws and to be retimed. Patient was in dialysis and didn't have 1915 trop drawn. Awaiting call back from service.

## 2016-10-06 NOTE — Incoming ED Transfer Note (Signed)
46 year old male just recently discharged from our facility for   Chronic renal insufficiency, ascites secondary to cirrhosis, and hemodialysis.  [place] and did not make arrangements for hemodialysis while away. The patient apparently left our facility recently and [facility] complaining of chest pain.  They report he has a normal EKG, a minimal elevation in his troponin, as well as a mild elevation in his CK.  He appears to be anemic and mildly hyperkalemic with a potassium of 5.7.  The patient receives hemodialysis on Mondays, Wednesdays, and Fridays.  He reportedly as missed his last appointment and is in need of dialysis at this time.  According to the caller [facility] does not have the capability for hemodialysis at this time the requesting transfer to our facility.  The patient's be transported to our emergency department for evaluation for the need possible urgent hemodialysis.  Patient did receive a dose of Kayexalate prior to transfer.

## 2016-10-06 NOTE — ED Nurses Note (Signed)
Pt reports he was admitted her a few days ago for chest pains and states he got "irritated" and left the hospital. Pt reports since he has gone home the chest pains have gotten worse. Pt is a dialysis pt and last had dialysis on Monday, schedule of MWF. Pt hx of hypertension, arrives in the 200s systolically, reports he hasn't been able to take his BP medications in a couple of days. Pt reports multiple episodes of watery diarrhea and diffuse abdominal pain.

## 2016-10-06 NOTE — ED Provider Notes (Signed)
[facility] ED      Encounter Diagnosis   Name Primary?   . Chest pain          ED Course/MDM:    Patient is a 46 y.o. male who presents to the ED with chest pain abdominal pain and diarrhea.   Patient was found to have no acute ischemic changes on EKG.  Troponin was mildly elevated likely secondary to the ESRD.  Patient has a history of recurrent C diff which is the likely source of his diarrhea.  Patient was hypertensive while in the ED but declined any antihypertensive therapies.   Labs additionally noted an elevated potassium.    Patient admission and elevated K but refuse insulin and dextrose.   Patient was readmitted to Nephrology.    Discussion was had with attending physician, Dr. Lawernce Pitts, whom also saw and evaluated the pt.       Differential diagnosis includes, but is not limited to, ACS, c diff, gastroenteritis, angina   Pt remained vitally stable during their stay in the emergency department.    Pt was admitted to Nephrology without further incident in the ED.       Disposition: Admitted        Chief Complaint:  Patient presents with     Chief Complaint   Patient presents with   . Renal Failure     Tx from [facility], dialysis pt, missed last appointment, M, W, F schedule.  Tx here due to HTN and needing dialysis.  K 5.7.         Pre-Hospital Interventions: none    HPI    [name], date of birth 20-Jun-1970, is a 46 y.o. male who presents to the Emergency Department with chest pain. Patient was recently admitted to [facility] for chest pain, abdominal pain and diarrhea.  Patient eventually left AMA on the 18th.  Patient reports that since then he has had persistent chest pain abdominal pain diarrhea.  Patient reports that the the chest pain is similar to his heart attack in 2010.  Describes as a chest pressure that radiates to his left arm.  Patient initially presented [place] [facility] are he was given 324 of aspirin and 3 sublingual nitro and 30 of kayexelate.   Patient only  reports that the chest pain is accompanied by shortness of breath nausea.  Patient also states that he has profuse watery diarrhea with up to 8 bowel movements a day. Patient additionally reports that he has not been taking any of his antihypertensives secondary to his nausea.     ROS:   Constitutional: No fever, chills or weakness   Skin: No rash or diaphoresis  HENT: No headaches, or congestion  Eyes: No vision changes or photophobia   Cardio:   Positive for chest pain  Respiratory: No cough, wheezing or SOB  GI:   Positive for nausea and abdominal pain and diarrhea.  GU:  No dysuria, hematuria, or increased frequency  MSK: No muscle aches, joint or back pain  Neuro: No seizures, LOC, numbness, tingling, or focal weakness  Psychiatric: No depression, SI or substance abuse  All other systems reviewed and are negative.    PE:   ED Triage Vitals   Enc Vitals Group      BP (Non-Invasive) 10/06/16 1620 201/121      Heart Rate 10/06/16 1620 82      Respiratory Rate 10/06/16 1620 21      Temperature 10/06/16 1620 36.6 C (97.9  F)      Temp src --       SpO2-1 10/06/16 1620 100 %      Weight 10/06/16 1620 115.2 kg (254 lb)      Height 10/06/16 1620 1.854 m ('6\' 1"'$ )      Head Cir --       Peak Flow --       Pain Score --       Pain Loc --       Pain Edu? --       Excl. in [place]? --        Nursing notes and vital signs reviewed.    Constitutional: Pleasant 46 y.o. male appears chronically ill in no acute distress, normal color, no cyanosis.   HENT:   Head: Normocephalic and atraumatic.   Mouth/Throat: Oropharynx is clear and moist.   Eyes: EOMI, PERRL   Neck: Trachea midline. Neck supple.  Cardiovascular: RRR, No murmurs, rubs or gallops. Intact distal pulses.  Pulmonary/Chest: BS equal bilaterally. No respiratory distress. No wheezes, rales or chest tenderness.   Abdominal: BS +.   Abdomen is soft mildly distended and diffusely tender to palpation  Back: No midline spinal tenderness, no paraspinal tenderness, no CVA  tenderness.           Musculoskeletal: No edema, tenderness or deformity.  Skin: warm and dry. No rash, erythema, pallor or cyanosis  Psychiatric: normal mood and affect. Behavior is normal.   Neurological: Patient keenly alert and responsive, CN II-XII grossly intact, moving all extremities equally and fully, normal gait      Past Medical History:  Diagnosis     Past Medical History:   Diagnosis Date   . Ascites    . Asthma    . Clostridium difficile infection 08/28/2016    +  C Diff 08/2016   . Dialysis patient ([place])    . HTN (hypertension)    . Kidney failure    . MI, old        Past Surgical History:  Past Surgical History:   Procedure Laterality Date   . Av fistula placement     . Hx coronary stent placement     . Hx hernia repair         Family History:   Family History   Problem Relation Age of Onset   . Hypertension Mother    . Healthy Father        Social History     Social History   Substance Use Topics   . Smoking status: Current Every Day Smoker     Packs/day: 0.25     Types: Cigarettes   . Smokeless tobacco: Never Used   . Alcohol use No       History   Drug Use No       No Pcp    Allergies   Allergen Reactions   . Lisinopril      Lip swelling     . Morphine      Throat swelling. Has gotten dilaudid without issue.      . Toradol [Ketorolac]      Breathing issues         Diagnostics:    Labs:    Results for orders placed or performed during the hospital encounter of 10/06/16   TROPONIN-I (FOR ED ONLY)   Result Value Ref Range    TROPONIN I 87 (HH) 0 - 30 ng/L   VENOUS BLOOD GAS/LACTATE/CO-OX/LYTES (NA/K/CA/CL/GLUC)   Result  Value Ref Range    %FIO2 (VENOUS) 32.0 %    PH (VENOUS) 7.32 7.31 - 7.41    PCO2 (VENOUS) 50.00 41.00 - 51.00 mm/Hg    PO2 (VENOUS) 22.0 (L) 35.0 - 50.0 mm/Hg    BASE DEFICIT 0.5 -3.0 - 3.0 mmol/L    BICARBONATE (VENOUS) 23.7 22.0 - 26.0 mmol/L    SODIUM 134 (L) 136 - 145 mmol/L    WHOLE BLOOD POTASSIUM 6.4 (HH) 3.5 - 5.0 mmol/L    CHLORIDE 104 96 - 111 mmol/L    IONIZED CALCIUM 0.92  (L) 1.10 - 1.36 mmol/L    GLUCOSE 85 60 - 105 mg/dL    LACTATE 0.7 0.0 - 1.3 mmol/L    HEMOGLOBIN 8.4 (L) 12.0 - 18.0 g/dL    OXYHEMOGLOBIN 46.3 40.0 - 80.0 %    CARBOXYHEMOGLOBIN 3.1 (H) 0.0 - 2.5 %    MET-HEMOGLOBIN 0.9 0.0 - 3.5 %    O2CT 5.5 (L) 6.7 - 17.5 %   CBC WITH DIFF   Result Value Ref Range    WBC 5.0 3.5 - 11.0 x10^3/uL    RBC 2.76 (L) 4.06 - 5.63 x10^6/uL    HGB 8.6 (L) 12.5 - 16.3 g/dL    HCT 26.1 (L) 36.7 - 47.0 %    MCV 94.9 78.0 - 100.0 fL    MCH 31.1 27.4 - 33.0 pg    MCHC 32.8 32.5 - 35.8 g/dL    RDW 16.9 (H) 12.0 - 15.0 %    PLATELETS 96 (L) 140 - 450 x10^3/uL    MPV 8.2 7.5 - 11.5 fL    NEUTROPHIL % 78 %    LYMPHOCYTE % 6 %    MONOCYTE % 7 %    EOSINOPHIL % 9 %    BASOPHIL % 1 %    NEUTROPHIL # 3.90 1.50 - 7.70 x10^3/uL    LYMPHOCYTE # 0.32 (L) 1.00 - 4.80 x10^3/uL    MONOCYTE # 0.34 0.30 - 1.00 x10^3/uL    EOSINOPHIL # 0.44 0.00 - 0.50 x10^3/uL    BASOPHIL # 0.04 0.00 - 0.20 x10^3/uL   LIPASE   Result Value Ref Range    LIPASE 30 10 - 80 U/L   HEPATIC FUNCTION PANEL   Result Value Ref Range    ALBUMIN 2.9 (L) 3.5 - 5.0 g/dL    ALKALINE PHOSPHATASE 238 (H) <150 U/L    ALT (SGPT) 14 <55 U/L    AST (SGOT) 21 8 - 48 U/L    BILIRUBIN TOTAL 0.6 0.3 - 1.3 mg/dL    BILIRUBIN DIRECT 0.3 (H) <0.3 mg/dL    PROTEIN TOTAL 5.7 (L) 6.4 - 8.3 g/dL   PT/INR   Result Value Ref Range    PROTHROMBIN TIME 12.9 9.3 - 13.9 seconds    INR 1.11 0.80 - 1.20   PTT (PARTIAL THROMBOPLASTIN TIME)   Result Value Ref Range    APTT 35.5 25.1 - 36.5 seconds   TROPONIN-I   Result Value Ref Range    TROPONIN I 86 (HH) 0 - 30 ng/L   BASIC METABOLIC PANEL   Result Value Ref Range    SODIUM 141 136 - 145 mmol/L    POTASSIUM 4.8 3.5 - 5.1 mmol/L    CHLORIDE 104 96 - 111 mmol/L    CO2 TOTAL 27 22 - 32 mmol/L    ANION GAP 10 4 - 13 mmol/L    CALCIUM 7.6 (L) 8.5 - 10.2 mg/dL    GLUCOSE 91 65 -  139 mg/dL    BUN 31 (H) 8 - 25 mg/dL    CREATININE 5.94 (H) 0.62 - 1.27 mg/dL    BUN/CREA RATIO 5 (L) 6 - 22    ESTIMATED GFR 13 (L) >59  mL/min/1.49m2   HEPATIC FUNCTION PANEL   Result Value Ref Range    ALBUMIN 3.0 (L) 3.5 - 5.0 g/dL    ALKALINE PHOSPHATASE 258 (H) <150 U/L    ALT (SGPT) 15 <55 U/L    AST (SGOT) 24 8 - 48 U/L    BILIRUBIN TOTAL 0.6 0.3 - 1.3 mg/dL    BILIRUBIN DIRECT 0.2 <0.3 mg/dL    PROTEIN TOTAL 6.1 (L) 6.4 - 8.3 g/dL   MAGNESIUM   Result Value Ref Range    MAGNESIUM 2.0 1.6 - 2.5 mg/dL   PHOSPHORUS   Result Value Ref Range    PHOSPHORUS 4.7 2.4 - 4.7 mg/dL   PT/INR   Result Value Ref Range    PROTHROMBIN TIME 12.6 9.3 - 13.9 seconds    INR 1.09 0.80 - 1.20   CBC WITH DIFF   Result Value Ref Range    WBC 4.9 3.5 - 11.0 x10^3/uL    RBC 2.47 (L) 4.06 - 5.63 x10^6/uL    HGB 7.8 (L) 12.5 - 16.3 g/dL    HCT 23.2 (L) 36.7 - 47.0 %    MCV 94.0 78.0 - 100.0 fL    MCH 31.7 27.4 - 33.0 pg    MCHC 33.7 32.5 - 35.8 g/dL    RDW 17.1 (H) 12.0 - 15.0 %    PLATELETS 96 (L) 140 - 450 x10^3/uL    MPV 7.9 7.5 - 11.5 fL    NEUTROPHIL % 68 %    LYMPHOCYTE % 8 %    MONOCYTE % 11 %    EOSINOPHIL % 12 %    BASOPHIL % 1 %    NEUTROPHIL # 3.33 1.50 - 7.70 x10^3/uL    LYMPHOCYTE # 0.40 (L) 1.00 - 4.80 x10^3/uL    MONOCYTE # 0.52 0.30 - 1.00 x10^3/uL    EOSINOPHIL # 0.58 (H) 0.00 - 0.50 x10^3/uL    BASOPHIL # 0.06 0.00 - 0.20 x10^3/uL   TROPONIN-I   Result Value Ref Range    TROPONIN I 95 (H) 0 - 30 ng/L   ECG 12-LEAD   Result Value Ref Range    Ventricular rate 81 BPM    Atrial Rate 81 BPM    PR Interval 208 ms    QRS Duration 92 ms    QT Interval 422 ms    QTC Calculation 490 ms    Calculated P Axis 53 degrees    Calculated T Axis 89 degrees     Labs reviewed and interpreted by me.    Radiology:    Results for orders placed or performed during the hospital encounter of 10/06/16   XR AP MOBILE CHEST     Status: None    Narrative    Jamien D MCooleemee Male, 46years old.    XR AP MOBILE CHEST performed on 10/06/2016 4:47 PM.    REASON FOR EXAM:  chest pain    TECHNIQUE: 1 view/1 image(s) submitted for interpretation.    COMPARISON: October 04, 2016    FINDINGS:     Adequate inspiration is present.    LUNGS: There is redemonstration of hazy bibasilar airspace prominence  similar to prior exam again likely representing early pulmonary edema.  Bibasilar atelectasis is seen.  HEART AND VASCULATURE: Cardiomegaly is redemonstrated. There is  redemonstration of stent graft projecting over the expected location of the  RIGHT subclavian.    PLEURA: Small bilateral pleural effusions are present. No pneumothorax is  demonstrated.    SUPPORT DEVICES:  Dual lumen LEFT subclavian approach central venous catheter is seen with  tip projecting over the RIGHT atrium.    OTHER:  None      Impression    Stable aeration with findings again concerning for early pulmonary edema  including mixed airspace/interstitial opacities, cardiomegaly and small  bilateral pleural effusions.         EKG:  12 lead EKG interpreted by me shows sinus rhythm with no acute evidence of ischemia.      Orders:  Orders Placed This Encounter   . XR AP MOBILE CHEST   . CANCELED: BASIC METABOLIC PANEL, NON-FASTING   . CBC/DIFF   . CANCELED: MAGNESIUM   . CANCELED: PHOSPHORUS   . TROPONIN-I (FOR ED ONLY)   . VENOUS BLOOD GAS/LACTATE/CO-OX/LYTES (NA/K/CA/CL/GLUC)   . CBC WITH DIFF   . LIPASE   . HEPATIC FUNCTION PANEL   . PT/INR   . PTT (PARTIAL THROMBOPLASTIN TIME)   . CANCELED: TROPONIN-I   . CANCELED: BASIC METABOLIC PANEL   . CANCELED: CBC/DIFF   . CANCELED: MAGNESIUM   . CANCELED: PHOSPHORUS   . CANCELED: PT/INR   . CANCELED: HEPATIC FUNCTION PANEL   . TROPONIN-I   . CANCELED: CBC WITH DIFF   . BASIC METABOLIC PANEL   . CBC/DIFF   . HEPATIC FUNCTION PANEL   . MAGNESIUM   . PHOSPHORUS   . PT/INR   . CBC WITH DIFF   . CANCELED: TROPONIN-I   . CANCELED: TROPONIN-I   . CANCELED: BASIC METABOLIC PANEL   . CANCELED: CBC/DIFF   . CANCELED: MAGNESIUM   . CANCELED: PHOSPHORUS   . CANCELED: HEPATIC FUNCTION PANEL   . ECG 12-LEAD   . CANCELED: TRANSTHORACIC ECHOCARDIOGRAM - ADULT   . CANCELED: INSERT & MAINTAIN PERIPHERAL IV  ACCESS   . CANCELED: HEMODIALYSIS   . HEMODIALYSIS   . PATIENT CLASS/LEVEL OF CARE DESIGNATION   . CANCELED: STRESS TEST - ADULT   . CANCELED: MYOCARDIAL PERFUSION COMPLETE   . hydrALAZINE (APRESOLINE) tablet   . HYDROmorphone (DILAUDID) 2 mg/mL injection   . vancomycin 50 mg per mL oral liquid with cherry flavoring   . premixed hemodialysate (NATURALYTE) 3.43 L with potassium chloride 2 mEq/L, calcium chloride 2.5 mEq/L   . sodium citrate 4% injection   . ondansetron (ZOFRAN) 2 mg/mL injection   . HYDROmorphone (DILAUDID) 2 mg/mL injection       Pain Management: none    Sardar Momin Shah-Khan, MD     Parts of this patients chart were completed in a retrospective fashion due to simultaneous direct patient care activities in the Emergency Department. This note was partially generated using MModal Fluency Direct System, and there may be some incorrect words, spellings, and punctuation that were not noted in proofreading the note prior to saving.

## 2016-10-06 NOTE — ED Attending Note (Signed)
I was physically present and directly supervised this patient's care. Patient was seen and examined. The midlevel's/resident's history and exam were reviewed. Key elements in addition to and/or correction of that documentation are as follows:    Chief Complaint   Patient presents with    Renal Failure     [facility], dialysis pt, missed last appointment, M, W, F schedule.  Tx here due to HTN and needing dialysis.  K 5.7.         [name] is a 46 y.o. male p/w renal failure. Pt on dialysis MWF. Pt with chest pain and hyper-K. Pt was here 2 days ago and left AMA from nephro service. Pt was admitted for this. Pt with K of 5.7 at [facility] and was given kayexalate. Pt got dialysis as usual on Monday (2 days ago). Pt was due for dialysis today. Pt with some shortness of breath and abd pain.     Pertinent Exam  Filed Vitals:    10/06/16 1620 10/06/16 1625 10/06/16 1630   BP: (!) 201/121 (!) 191/121 (!) 197/123   Pulse: 82 85 85   Resp: (!) 21 18 19    Temp: 36.6 C (97.9 F)     SpO2: 100% 100% 100%     Agree w resident    Course  Bedside Ultrasound     Results reviewed.       Impressions:   Encounter Diagnosis   Name Primary?    Chest pain        Dispo: Admitted  Additional verbal discharge instructions were given and discussed with the patient    Leary RocaNicole L Ziggy Reveles, MD 10/06/2016, 16:41  Emergency Medicine Attending    Chart completed after conclusion of patient care due to time constraints of direct patient care during shift.

## 2016-10-06 NOTE — ED Nurses Note (Signed)
Verbal report given to RN Sandra. Transferring care at this time.

## 2016-10-06 NOTE — ED Nurses Note (Signed)
Patient returned from dialysis into room 34. Awaiting room to be cleaned upstairs.

## 2016-10-06 NOTE — ED Nurses Note (Signed)
Report given RN in dialysis. Dialysis to place patient into transport when they are ready for patient.

## 2016-10-06 NOTE — ED Nurses Note (Signed)
Providers aware pt current BP 185/113, no new orders at this time.

## 2016-10-06 NOTE — ED Nurses Note (Signed)
SBAR report received from dialysis RN. 3 L removed from patient. Patient tolerated dialysis well.

## 2016-10-07 ENCOUNTER — Emergency Department (HOSPITAL_COMMUNITY): Payer: Medicare Other

## 2016-10-07 ENCOUNTER — Other Ambulatory Visit (HOSPITAL_COMMUNITY): Payer: Self-pay

## 2016-10-07 ENCOUNTER — Encounter (HOSPITAL_COMMUNITY): Payer: Self-pay

## 2016-10-07 ENCOUNTER — Observation Stay (EMERGENCY_DEPARTMENT_HOSPITAL)
Admission: EM | Admit: 2016-10-07 | Discharge: 2016-10-08 | Discharge status: Against Medical Advice | Payer: Medicare Other | Source: Home / Self Care | Attending: Emergency Medicine | Admitting: Emergency Medicine

## 2016-10-07 ENCOUNTER — Observation Stay (HOSPITAL_COMMUNITY): Payer: Medicare Other | Admitting: Internal Medicine

## 2016-10-07 DIAGNOSIS — E8770 Fluid overload, unspecified: Principal | ICD-10-CM

## 2016-10-07 DIAGNOSIS — R109 Unspecified abdominal pain: Secondary | ICD-10-CM

## 2016-10-07 DIAGNOSIS — R000 Tachycardia, unspecified: Secondary | ICD-10-CM

## 2016-10-07 DIAGNOSIS — R079 Chest pain, unspecified: Secondary | ICD-10-CM

## 2016-10-07 DIAGNOSIS — R9431 Abnormal electrocardiogram [ECG] [EKG]: Secondary | ICD-10-CM

## 2016-10-07 LAB — HEPATIC FUNCTION PANEL
ALBUMIN: 3 g/dL — ABNORMAL LOW (ref 3.5–5.0)
ALBUMIN: 3.5 g/dL (ref 3.2–4.6)
ALKALINE PHOSPHATASE: 258 U/L — ABNORMAL HIGH (ref <150)
ALKALINE PHOSPHATASE: 258 U/L — ABNORMAL HIGH (ref <150)
ALKALINE PHOSPHATASE: 244 U/L — ABNORMAL HIGH (ref 20–130)
ALT (SGPT): 15 U/L (ref <55)
ALT (SGPT): 14 U/L (ref <=52)
AST (SGOT): 24 U/L (ref 8–48)
AST (SGOT): 25 U/L (ref <=35)
BILIRUBIN DIRECT: 0.2 mg/dL (ref <0.3)
BILIRUBIN DIRECT: 0.1 mg/dL (ref 0.1–0.3)
BILIRUBIN DIRECT: 0.1 mg/dL (ref <=0.3)
BILIRUBIN TOTAL: 0.6 mg/dL (ref 0.3–1.3)
BILIRUBIN TOTAL: 0.5 mg/dL (ref 0.3–1.2)
PROTEIN TOTAL: 6.1 g/dL — ABNORMAL LOW (ref 6.4–8.3)
PROTEIN TOTAL: 5.7 g/dL — ABNORMAL LOW (ref 6.0–8.3)

## 2016-10-07 LAB — CBC WITH DIFF
BASOPHIL #: 0.06 x10ˆ3/uL (ref 0.00–0.20)
BASOPHIL #: 0 x10ˆ3/uL (ref 0.00–0.20)
BASOPHIL %: 1 %
BASOPHIL %: 1 %
EOSINOPHIL #: 0.58 x10ˆ3/uL — ABNORMAL HIGH (ref 0.00–0.50)
EOSINOPHIL #: 0.3 x10ˆ3/uL (ref 0.00–0.50)
EOSINOPHIL %: 12 %
EOSINOPHIL %: 7 %
HCT: 23.2 % — ABNORMAL LOW (ref 36.7–47.0)
HCT: 24.1 % — ABNORMAL LOW (ref 38.9–50.5)
HGB: 7.8 g/dL — ABNORMAL LOW (ref 12.5–16.3)
HGB: 8 g/dL — ABNORMAL LOW (ref 13.4–17.3)
LYMPHOCYTE #: 0.4 x10ˆ3/uL — ABNORMAL LOW (ref 1.00–4.80)
LYMPHOCYTE #: 0.3 x10ˆ3/uL — ABNORMAL LOW (ref 0.80–3.20)
LYMPHOCYTE %: 8 %
LYMPHOCYTE %: 5 %
LYMPHOCYTE %: 5 %
MCH: 31.7 pg (ref 27.4–33.0)
MCH: 31.7 pg (ref 27.4–33.0)
MCH: 31.9 pg (ref 27.9–33.1)
MCHC: 33.7 g/dL (ref 32.5–35.8)
MCHC: 33.3 g/dL (ref 32.8–36.0)
MCV: 94 fL (ref 78.0–100.0)
MCV: 95.8 fL — ABNORMAL HIGH (ref 82.4–95.0)
MCV: 95.8 fL — ABNORMAL HIGH (ref 82.4–95.0)
MONOCYTE #: 0.52 x10ˆ3/uL (ref 0.30–1.00)
MONOCYTE #: 0.4 x10ˆ3/uL (ref 0.20–0.80)
MONOCYTE %: 11 %
MONOCYTE %: 8 %
MPV: 7.9 fL (ref 7.5–11.5)
MPV: 8.6 fL (ref 6.0–10.2)
NEUTROPHIL #: 3.33 x10ˆ3/uL (ref 1.50–7.70)
NEUTROPHIL #: 3.6 x10ˆ3/uL (ref 1.60–5.50)
NEUTROPHIL %: 68 %
NEUTROPHIL %: 78 %
PLATELETS: 96 x10ˆ3/uL — ABNORMAL LOW (ref 140–450)
PLATELETS: 94 x10ˆ3/uL — ABNORMAL LOW (ref 140–440)
RBC: 2.47 x10ˆ6/uL — ABNORMAL LOW (ref 4.06–5.63)
RBC: 2.52 x10ˆ6/uL — ABNORMAL LOW (ref 4.40–5.68)
RDW: 17.1 % — ABNORMAL HIGH (ref 12.0–15.0)
RDW: 16.8 % — ABNORMAL HIGH (ref 10.9–15.1)
WBC: 4.9 x10ˆ3/uL (ref 3.5–11.0)
WBC: 4.6 x10ˆ3/uL (ref 3.3–9.3)

## 2016-10-07 LAB — BASIC METABOLIC PANEL
ANION GAP: 10 mmol/L (ref 4–13)
ANION GAP: 13 mmol/L
BUN/CREA RATIO: 5 — ABNORMAL LOW (ref 6–22)
BUN/CREA RATIO: 5
BUN: 31 mg/dL — ABNORMAL HIGH (ref 8–25)
BUN: 33 mg/dL — ABNORMAL HIGH (ref 10–25)
BUN: 33 mg/dL — ABNORMAL HIGH (ref 10–25)
CALCIUM: 7.6 mg/dL — ABNORMAL LOW (ref 8.5–10.2)
CALCIUM: 7.1 mg/dL — ABNORMAL LOW (ref 8.2–10.2)
CHLORIDE: 104 mmol/L (ref 96–111)
CHLORIDE: 103 mmol/L (ref 98–111)
CO2 TOTAL: 27 mmol/L (ref 22–32)
CO2 TOTAL: 24 mmol/L (ref 21–35)
CREATININE: 5.94 mg/dL — ABNORMAL HIGH (ref 0.62–1.27)
CREATININE: 6.43 mg/dL — ABNORMAL HIGH (ref <=1.30)
CREATININE: 6.43 mg/dL — ABNORMAL HIGH (ref <=1.30)
ESTIMATED GFR: 13 mL/min/1.73mˆ2 — ABNORMAL LOW (ref >59)
ESTIMATED GFR: 11 mL/min/1.73mˆ2 — ABNORMAL LOW
GLUCOSE: 91 mg/dL (ref 65–139)
GLUCOSE: 130 mg/dL — ABNORMAL HIGH (ref 70–110)
POTASSIUM: 4.8 mmol/L (ref 3.5–5.1)
POTASSIUM: 4.6 mmol/L (ref 3.5–5.0)
SODIUM: 141 mmol/L (ref 136–145)
SODIUM: 140 mmol/L (ref 135–145)

## 2016-10-07 LAB — PT/INR
INR: 1.09 (ref 0.80–1.20)
INR: 1.18 — ABNORMAL HIGH (ref 0.90–1.10)
PROTHROMBIN TIME: 12.6 s (ref 9.3–13.9)

## 2016-10-07 LAB — VENOUS BLOOD GAS
%FIO2 (VENOUS): 28 %
BASE EXCESS: 1.5 mmol/L (ref <=3.0)
BASE EXCESS: 1.5 mmol/L (ref ?–2.0)
BICARBONATE (VENOUS): 25.2 mmol/L (ref 23.0–33.0)
PCO2 (VENOUS): 46 mmHg (ref 38.00–50.00)
PH (VENOUS - TEMP CORRECTED): 7.4
PH (VENOUS): 7.38 (ref 7.32–7.45)
PO2 (VENOUS - TEMP CORRECTED): 34 mm/Hg
PO2 (VENOUS): 34 mmHg (ref 30.0–50.0)
TCO2: 28.6

## 2016-10-07 LAB — LIPASE
LIPASE: 105 U/L — ABNORMAL HIGH (ref 0–82)
LIPASE: 105 U/L — ABNORMAL HIGH (ref 0–82)

## 2016-10-07 LAB — C. DIFFICILE PCR TESTING - BMC/JMC/~~LOC~~/STJ/PVH: PRESUMPTIVE 027/NAP1/BI: POSITIVE — AB

## 2016-10-07 LAB — TROPONIN-I
TROPONIN I: 95 ng/L — ABNORMAL HIGH (ref 0–30)
TROPONIN I: 0.07 ng/mL (ref <=0.04)
TROPONIN I: 0.07 ng/mL (ref <=0.04)

## 2016-10-07 LAB — SMOOTH MUSCLE ANTIBODIES, SERUM: ANTI-SMOOTH MUSCLE AB: NEGATIVE

## 2016-10-07 LAB — LACTIC ACID LEVEL: LACTIC ACID: 1.7 mmol/L (ref <2.0)

## 2016-10-07 LAB — C. DIFFICILE PCR: C. DIFFICILE TOXIN GENE, PCR: POSITIVE — CR

## 2016-10-07 LAB — PTT (PARTIAL THROMBOPLASTIN TIME): APTT: 38.1 s — ABNORMAL HIGH (ref 25.0–37.0)

## 2016-10-07 LAB — MAGNESIUM: MAGNESIUM: 2 mg/dL (ref 1.6–2.5)

## 2016-10-07 LAB — PHOSPHORUS: PHOSPHORUS: 4.7 mg/dL (ref 2.4–4.7)

## 2016-10-07 MED ORDER — HEPARIN (PORCINE) 10,000 UNIT/ML INJECTION SOLUTION
5000.0000 [IU] | Freq: Three times a day (TID) | INTRAMUSCULAR | Status: DC
Start: 2016-10-07 — End: 2016-10-08
  Administered 2016-10-07 – 2016-10-08 (×4): 0 [IU] via SUBCUTANEOUS
  Filled 2016-10-07 (×2): qty 1
  Filled 2016-10-07 (×2): qty 0.5
  Filled 2016-10-07: qty 1
  Filled 2016-10-07: qty 0.5
  Filled 2016-10-07: qty 1
  Filled 2016-10-07: qty 0.5

## 2016-10-07 MED ORDER — HYDROMORPHONE 2 MG/ML INJECTION SYRINGE
0.4000 mg | INJECTION | Freq: Once | INTRAMUSCULAR | Status: AC
Start: 2016-10-07 — End: 2016-10-07
  Administered 2016-10-07: 0.4 mg via INTRAVENOUS
  Filled 2016-10-07: qty 1

## 2016-10-07 MED ORDER — NITROGLYCERIN 2 % TRANSDERMAL OINTMENT - PACKET
1.00 [in_us] | TOPICAL_OINTMENT | TRANSDERMAL | Status: AC
Start: 2016-10-07 — End: 2016-10-07
  Administered 2016-10-07: 1 [in_us] via TOPICAL
  Filled 2016-10-07: qty 1

## 2016-10-07 MED ORDER — NITROGLYCERIN 0.4 MG SUBLINGUAL TABLET
0.4000 mg | SUBLINGUAL_TABLET | SUBLINGUAL | 1 refills | Status: DC | PRN
Start: 2016-10-07 — End: 2016-10-07

## 2016-10-07 MED ORDER — FENTANYL (PF) 50 MCG/ML INJECTION SOLUTION
25.00 ug | INTRAMUSCULAR | Status: AC
Start: 2016-10-07 — End: 2016-10-07
  Administered 2016-10-07: 25 ug via INTRAVENOUS
  Filled 2016-10-07: qty 2

## 2016-10-07 MED ORDER — ACETAMINOPHEN 325 MG TABLET
650.0000 mg | ORAL_TABLET | Freq: Four times a day (QID) | ORAL | Status: DC | PRN
Start: 2016-10-07 — End: 2016-10-08

## 2016-10-07 MED ORDER — HYDROMORPHONE 0.5 MG/0.5 ML INJECTION SYRINGE
0.50 mg | INJECTION | INTRAMUSCULAR | Status: DC | PRN
Start: 2016-10-07 — End: 2016-10-08
  Administered 2016-10-07 – 2016-10-08 (×6): 0.5 mg via INTRAVENOUS
  Filled 2016-10-07 (×4): qty 1
  Filled 2016-10-07: qty 0.5
  Filled 2016-10-07: qty 1

## 2016-10-07 MED ORDER — FENTANYL (PF) 50 MCG/ML INJECTION SOLUTION
25.00 ug | INTRAMUSCULAR | Status: AC
Start: 2016-10-07 — End: 2016-10-07
  Administered 2016-10-07: 25 ug via INTRAVENOUS
  Filled 2016-10-07 (×2): qty 2

## 2016-10-07 MED ORDER — SODIUM CHLORIDE 0.9 % (FLUSH) INJECTION SYRINGE
3.0000 mL | INJECTION | INTRAMUSCULAR | Status: DC | PRN
Start: 2016-10-07 — End: 2016-10-08

## 2016-10-07 MED ORDER — LABETALOL 5 MG/ML INTRAVENOUS SOLUTION
10.0000 mg | Freq: Once | INTRAVENOUS | Status: AC
Start: 2016-10-07 — End: 2016-10-07
  Administered 2016-10-07: 10 mg via INTRAVENOUS
  Filled 2016-10-07: qty 20

## 2016-10-07 MED ORDER — METRONIDAZOLE 500 MG/100 ML IN SODIUM CHLOR(ISO) INTRAVENOUS PIGGYBACK
500.00 mg | INJECTION | Freq: Three times a day (TID) | INTRAVENOUS | Status: DC
Start: 2016-10-07 — End: 2016-10-08
  Administered 2016-10-08: 500 mg via INTRAVENOUS
  Administered 2016-10-08: 17:00:00 0 mg via INTRAVENOUS
  Administered 2016-10-08 (×2): 500 mg via INTRAVENOUS
  Filled 2016-10-07 (×5): qty 100

## 2016-10-07 MED ORDER — ONDANSETRON HCL (PF) 4 MG/2 ML INJECTION SOLUTION
4.00 mg | INTRAMUSCULAR | Status: AC
Start: 2016-10-07 — End: 2016-10-07
  Administered 2016-10-07: 4 mg via INTRAVENOUS
  Filled 2016-10-07: qty 2

## 2016-10-07 MED ORDER — SODIUM CHLORIDE 0.9 % (FLUSH) INJECTION SYRINGE
3.0000 mL | INJECTION | Freq: Three times a day (TID) | INTRAMUSCULAR | Status: DC
Start: 2016-10-07 — End: 2016-10-08
  Administered 2016-10-07 – 2016-10-08 (×2): 0 mL
  Administered 2016-10-08: 3 mL

## 2016-10-07 MED ORDER — ONDANSETRON HCL (PF) 4 MG/2 ML INJECTION SOLUTION
4.0000 mg | Freq: Four times a day (QID) | INTRAMUSCULAR | Status: DC | PRN
Start: 2016-10-07 — End: 2016-10-08

## 2016-10-07 MED ADMIN — sodium chloride 0.9 % (flush) injection syringe: ORAL | @ 03:00:00

## 2016-10-07 MED ADMIN — ondansetron HCl (PF) 4 mg/2 mL injection solution: INTRAVENOUS | @ 15:00:00

## 2016-10-07 MED ADMIN — docusate sodium 100 mg capsule: INTRAVENOUS | @ 18:00:00

## 2016-10-07 MED ADMIN — lactated Ringers intravenous solution: ORAL | @ 03:00:00 | NDC 00264775000

## 2016-10-07 MED ADMIN — sodium chloride 0.9 % intravenous solution: ORAL | @ 09:00:00

## 2016-10-07 NOTE — Nurses Notes (Signed)
Patient has complaints of "sharp" abdominal pain across his lower abdomen and chest pain that is radiating to his left arm described as "something sitting on my chest". Patient has PRN PO Dilaudid ordered, patient does not want PO, requesting that it be IV dilaudid instead. Paged service.

## 2016-10-07 NOTE — Nurses Notes (Signed)
Pt. Refusing AM troponin lab. Nephrology service notified.

## 2016-10-07 NOTE — ED Nurses Note (Signed)
Admit service at bedside to speak w/pt.

## 2016-10-07 NOTE — Nurses Notes (Signed)
Pt arrived via cart from ER at 1800. Pt aaox3. IV site to right chest wall free of redness and edema. C/o of abd and chest pain 10/10. Admission completed, tele monitor applied, diet given, and medicated with iv dilaudid for c/o pain. SR up x2, bed in low, cb within reach. Will cont to monitor [employer]Energy CorpBrandy Jovahn Breit, RN  10/07/2016, [phone]

## 2016-10-07 NOTE — ED Attending Handoff Note (Signed)
Patient signed out to me by Dr. Marisa SprinklesKraft at shift change proximally 3:00 p.m.Marland Kitchen.  [facility] against medical advice.  Per their notes a lot of this seem to be related to inadequate pain control.  Patient kept asking for pain medications apparently.  The patient states that "too much was going on "and I was in the ER half the night before getting a bed.  Regardless, the patient had left against medical advice however there was still workup to be done in the patient had missed dialysis yesterday.  At the time of sign out all of his testing was pending except for his CT brain.  On review of the testing it appears the patient has some bit of fluid overload as well as intractable pain.  We will write him for more pain medication since he is agreeable to stay in the hospital with us.  I spoke with Dr. Burman FosterLane Elyanna Wallick, hospitalist, who saw the patient in the emergency department approximately 5:25 p.m.Marland Kitchen.    Tamala BariKevin J Anayelli Lai, MD  10/07/2016, 17:26

## 2016-10-07 NOTE — ED Nurses Note (Signed)
0020 - Offered pt PRN Tigan for nausea and then can attempt ordered pain med. Pt refused stating "I don't want stuck again", pt still req PIV pain med. Pt req to speak to doctor, admit service paged. Pt denies any other needs/concerns at this time. Will continue to monitor. Call bell in reach, encourage to call for assistance or pain.   0015 - Pt c/o pain, offered PRN pain med. Pt refused d/t nausea and req PIV dilaudid. Admit service paged.

## 2016-10-07 NOTE — Nurses Notes (Signed)
Patient NPO in anticipation of stress test, but patient refusing test. Spoke with service on the phone and they did not want to take NPO order out in case the patient would change his mind. Patient adamant that he is not having test done and wants to eat, patient requesting to speak with service. Service paged.

## 2016-10-07 NOTE — Nurses Notes (Signed)
Pt. Refusing all AM meds and care. Nephrology service notified. Pt. Also wishes to be discharged. Service to come up and discuss AMA with pt.

## 2016-10-07 NOTE — Nurses Notes (Signed)
Pt. Leaving AMA. All belongings with pt and belongings sheet signed. Telemetry removed. PIV removed, catheter intact, gauze dressing applied. Pt. teletracked for wheelchair assist to lobby.

## 2016-10-07 NOTE — Nurses Notes (Signed)
Patient arrived to room 877A. Oriented to room and call bell system. Patient deemed moderate fall risk per protocol, attempted to place yellow arm band and yellow slipper socks on patient. Patient refused to have either, insisting that he is not a fall risk. Explained to patient its hospital policy for slipper socks and fall arm band for fall risks, patient still refusing. Told patient that he would be NPO due to having stress test ordered. Patient stated that the did not want to go for stress test that he had one done in 2010 and doesn't want another. Patient requesting box lunch and something to drink. Paged service.

## 2016-10-07 NOTE — Discharge Summary (Signed)
[facility]  NEPHROLOGY - ESRD  Discharge Summary - AMA                                  Todd LindenMorgan,[name], 46 y.o. male Date ofAdmission:  10/06/2016   Date of Birth:  01/20/1971 Date of Discharge: 10/08/2016   PCP: No Pcp Attending: No att. providers found   MRN: [id number] Service: NEPHROLOGY     HOSPITAL DIAGNOSES:     Chief Complaint   Patient presents with   . Renal Failure     [facility], missed last appointment, M, W, F schedule.  Tx here due to HTN and needing dialysis.  K 5.7.         Admission Diagnosis: Diarrhea & Typical Angina    Principle Problem: Angina at rest [facility](HCC)  Active Hospital Problems    Diagnosis Date Noted   . Principle Problem: Angina at rest [facility](HCC) 10/06/2016   . Diarrhea 10/08/2016   . 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 Abdominal pain 10/08/2016      Resolved Hospital Problems    Diagnosis    No resolved problems to display.     Active Non-Hospital Problems    Diagnosis Date Noted   . Chest pain 09/12/2016   . Anemia in CKD (chronic kidney disease) 09/12/2016   . Smoker 09/12/2016   . Volume overload 08/26/2016   . ESRD (end stage renal disease) on dialysis (HCC) 08/26/2016   . Noncompliance with renal dialysis (HCC) 08/26/2016   . C. difficile diarrhea 08/26/2016   . Accelerated hypertension 08/26/2016         REASON FOR HOSPITALIZATION AND HOSPITAL COURSE:       [name] is a 46 y.o., male with significant past medical history of CAD s/p stents, ESRD on HD, Asthma, Ascites, HTN, Recurrent c diff. He presented to the ED on 10/06/2016 with chest pain and diarrhea. He was admitted less than 24 hours after leaving AMA with the same problem. He demonstrates pain seeking behavior and only wants IV dilaudid; he self induced emesis in an attempt to show that he could not take PO medication. We ordered a stress test and an echo to work up the chest pain given his h/o CAD with stents. However, he refused the cardiac work up and left AMA. Of note his stool  was negative for c. Diff and the peritoneal cultures collected the day prior on admission had NGTD. He was instructed to remain in patient for further evaluation. Risk of worsening illness, long term disability, or even death as a result of declining staying in admission discussed with and understood by patient. Patient demonstrated adequate comprehension and decision making capacity. He was encouraged to return to the Emergency Department at any time, and for any reason, to be re-evaluated.     DISCHARGE MEDICATIONS:        Current Discharge Medication List      CONTINUE these medications - NO CHANGES were made during your visit.       Details    albuterol sulfate 90 mcg/Actuation HFA Aerosol Inhaler oral inhaler    1-2 Puffs, Inhalation, Q6H PRN   Refills:  0       carvedilol 25 mg Tablet   Commonly known as:  COREG    25 mg, Oral, 2x/day-Food   Refills:  0       furosemide 80 mg  Tablet   Commonly known as:  LASIX    80 mg, Oral, Daily   Refills:  0       hydrALAZINE 100 mg Tablet   Commonly known as:  APRESOLINE    100 mg, Oral, 3x/day   Refills:  0       RENAGEL 800 mg Tablet   Generic drug:  sevelamer    800 mg, Oral, 3x/day-Meals   Refills:  0           Discharge med list refreshed?  YES  During thishospitalization did the patient have an AMI, PCI/PCTA, STENT or Isolated CABG?    No     IMAGING  PROCEDURES:        Results for orders placed or performed during the hospital encounter of 10/06/16   XR AP MOBILE CHEST     Status: None    Narrative    Jamelle [name] Mattie  Male, 46 years old.    XR AP MOBILE CHEST performed on 10/06/2016 4:47 PM.    REASON FOR EXAM:  chest pain    TECHNIQUE: 1 view/1 image(s) submitted for interpretation.    COMPARISON: October 04, 2016    FINDINGS:    Adequate inspiration is present.    LUNGS: There is redemonstration of hazy bibasilar airspace prominence  similar to prior exam again likely representing early pulmonary edema.  Bibasilar atelectasis is seen.    HEART AND VASCULATURE:  Cardiomegaly is redemonstrated. There is  redemonstration of stent graft projecting over the expected location of the  RIGHT subclavian.    PLEURA: Small bilateral pleural effusions are present. No pneumothorax is  demonstrated.    SUPPORT DEVICES:  Dual lumen LEFT subclavian approach central venous catheter is seen with  tip projecting over the RIGHT atrium.    OTHER:  None      Impression    Stable aeration with findings again concerning for early pulmonary edema  including mixed airspace/interstitial opacities, cardiomegaly and small  bilateral pleural effusions.          POST-DISCHARGENEEDS:      Tests & Procedures Needed after Discharge:     Cardiac stress test   TTE    Referrals & Follow-up Needed after Discharge:     PCP    DISCHARGE INSTRUCTIONS:        No discharge procedures on file.     CONDITION ON DISCHARGE  DISPOSITION:      Ambulation: Full ambulation   Self-care Ability: Complete   Cognitive Status: Alert and Oriented x 3   Code status at discharge: Full Code    Advance Directive Information       Most Recent Value    Does the Patient have an Advance Directive? Yes, Patient Does Have Advance Directive for Healthcare Treatment            Discharge Disposition: Home discharge Against Medical Advice      Copies sent to Care Team       Relationship Specialty Notifications Start End    Pcp, No PCP - General   10/06/16           Referring providers can utilize [email or web address] to access their referred [facility] patient's information.    Greggory Brandy, MD - 10/08/2016  Greggory Brandy, MD  [facility]  Internal Medicine, PGY-2  Pager # [phone]

## 2016-10-07 NOTE — Nurses Notes (Signed)
Pt. Removed telemetry after signing AMA forms with Nephrology service. Will remove PIV and teletrack pt to lobby once discharge order is placed.

## 2016-10-07 NOTE — Care Plan (Signed)
This patient currently meets requirements for low to mid level nursing care. The patient will continue to be monitored and re-evaluated for any changes in condition.  Laia Wiley, RN

## 2016-10-07 NOTE — Care Plan (Signed)
Problem: Patient Care Overview (Adult,OB)  Goal: Plan of Care Review(Adult,OB)  The patient and/or their representative will communicate an understanding of their plan of care   Outcome: Ongoing (see interventions/notes)    Goal: Individualization/Patient Specific Goal(Adult/OB)  Outcome: Ongoing (see interventions/notes)    Goal: Interdisciplinary Rounds/Family Conf  Outcome: Ongoing (see interventions/notes)      Problem: Skin Injury Risk (Adult,Obstetrics,Pediatric)  Goal: Skin Health and Integrity  Patient will demonstrate the desired outcomes by discharge/transition of care.   Outcome: Ongoing (see interventions/notes)      Problem: Pain, Acute (Adult)  Goal: Acceptable Pain Control/Comfort Level  Patient will demonstrate the desired outcomes by discharge/transition of care.   Outcome: Ongoing (see interventions/notes)      Problem: Fall Risk (Adult)  Goal: Absence of Falls  Patient will demonstrate the desired outcomes by discharge/transition of care.   Outcome: Ongoing (see interventions/notes)      Problem: Kidney Disease, Chronic/End Stage Renal Disease (Adult)  Prevent and manage potential problems including:  1. acid-base imbalance  2. cardiovascular disease  3. chronic pain  4. drug/nephrotoxicity  5. electrolyte imbalance  6. fluid imbalance  7. functional decline/self-care deficit  8. hematologic complications  9. hypertension  10. infection  11. pulmonary edema  12. renal osteodystrophy  13. situational response  14. undernutrition  15. uremic syndrome  16. vascular access complications   Goal: Signs and Symptoms of Listed Potential Problems Will be Absent, Minimized or Managed (Kidney Disease, Chronic/End Stage Renal Disease)  Signs and symptoms of listed potential problems will be absent, minimized or managed by discharge/transition of care (reference Kidney Disease, Chronic/End Stage Renal Disease (Adult) CPG).   Outcome: Ongoing (see interventions/notes)

## 2016-10-07 NOTE — Care Plan (Signed)
Problem: Skin Injury Risk (Adult,Obstetrics,Pediatric)  Goal: Identify Related Risk Factors and Signs and Symptoms  Related risk factors and signs and symptoms are identified upon initiation of Human Response Clinical Practice Guideline (CPG).  Outcome: Completed Date Met: 10/07/16    Goal: Skin Health and Integrity  Patient will demonstrate the desired outcomes by discharge/transition of care.  Outcome: Ongoing (see interventions/notes)      Problem: Pain, Acute (Adult)  Goal: Identify Related Risk Factors and Signs and Symptoms  Related risk factors and signs and symptoms are identified upon initiation of Human Response Clinical Practice Guideline (CPG).  Outcome: Completed Date Met: 10/07/16    Goal: Acceptable Pain Control/Comfort Level  Patient will demonstrate the desired outcomes by discharge/transition of care.  Outcome: Ongoing (see interventions/notes)      Problem: Fall Risk (Adult)  Goal: Identify Related Risk Factors and Signs and Symptoms  Related risk factors and signs and symptoms are identified upon initiation of Human Response Clinical Practice Guideline (CPG).  Outcome: Completed Date Met: 10/07/16    Goal: Absence of Falls  Patient will demonstrate the desired outcomes by discharge/transition of care.  Outcome: Ongoing (see interventions/notes)      Problem: Kidney Disease, Chronic/End Stage Renal Disease (Adult)  Prevent and manage potential problems including:  1. acid-base imbalance  2. cardiovascular disease  3. chronic pain  4. drug/nephrotoxicity  5. electrolyte imbalance  6. fluid imbalance  7. functional decline/self-care deficit  8. hematologic complications  9. hypertension  10. infection  11. pulmonary edema  12. renal osteodystrophy  13. situational response  14. undernutrition  15. uremic syndrome  16. vascular access complications  Goal: Signs and Symptoms of Listed Potential Problems Will be Absent, Minimized or Managed (Kidney Disease, Chronic/End Stage Renal Disease)  Signs and  symptoms of listed potential problems will be absent, minimized or managed by discharge/transition of care (reference Kidney Disease, Chronic/End Stage Renal Disease (Adult) CPG).  Outcome: Ongoing (see interventions/notes)

## 2016-10-07 NOTE — H&P (Signed)
[facility]  Hospitalist Admission H&P    Todd LindenMorgan,Yoan D, 46 y.o. male  Encounter Start Date:  10/07/2016  Inpatient Admission Date:    Date of Birth:  Jan 03, 1971    Information Obtained from: patient  Chief Complaint:  Chest pain    HPI: [name] is a 46 y.o., Black/African American male who [facility].  He starts the chest pain has been ongoing for several days now.  Of note he [facility] against medical advice after he stated they were not doing anything for him.  He states that he does have a history of CAD with history of 2 stents in the past.  He also states he has been having increasing nausea and vomiting recently.  Because of this he has been able to keep any thing down.  He also states because of this he missed hemodialysis on Wednesday.  [facility] for [facility].    Of note patient states that he has a history of ascites.  He is isn't sure of the cause of the ascites.  He denies a history of liver disease. He states that he frequently has paracenteses.    Of note [facility] needed [facility] stress test.  He refused that.  He is willing to undergo that testing here.    Past Medical History:   Diagnosis Date   . Ascites    . Asthma    . Clostridium difficile infection 08/28/2016    +  C Diff 08/2016   . Dialysis patient (HCC)    . HTN (hypertension)    . Kidney failure    . MI, old          Past Surgical History:   Procedure Laterality Date   . AV FISTULA PLACEMENT     . HX CORONARY STENT PLACEMENT     . HX HERNIA REPAIR           Medications Prior to Admission     Prescriptions    albuterol sulfate 90 mcg/Actuation Inhalation HFA Aerosol Inhaler oral inhaler    Take 1-2 Puffs by inhalation Every 6 hours as needed    carvedilol (COREG) 25 mg Oral Tablet    Take 25 mg by mouth Twice daily with food    furosemide (LASIX) 80 mg Oral Tablet    Take 80 mg by mouth Once a day     hydrALAZINE (APRESOLINE) 100 mg Oral Tablet    Take 100 mg by mouth Three times a day    nitroGLYCERIN (NITROSTAT) 0.4 mg Sublingual Tablet, Sublingual    1 Tab (0.4 mg total) by Sublingual route Every 5 minutes as needed for Chest pain (please call if systolic <120 or recieved greater than 3 doses) for 3 doses over 15 minutes    sevelamer (RENAGEL) 800 mg Oral Tablet    Take 800 mg by mouth Three times daily with meals        Allergies   Allergen Reactions   . Lisinopril      Lip swelling     . Morphine      Throat swelling. Has gotten dilaudid without issue.      . Toradol [Ketorolac]      Breathing issues     Social History     Social History   . Marital status: Divorced     Spouse name: N/A   .  Number of children: N/A   . Years of education: N/A     Social History Main Topics   . Smoking status: Current Every Day Smoker     Packs/day: 0.25     Types: Cigarettes   . Smokeless tobacco: Never Used   . Alcohol use No   . Drug use: No   . Sexual activity: Not on file     Other Topics Concern   . Not on file     Social History Narrative     Past Family History:     Family Medical History     Problem Relation (Age of Onset)    Healthy Father    Hypertension Mother        ROS: Other than ROS in the HPI, all other systems were negative.    EXAM:  Temperature: 36.7 C (98.1 F)  Heart Rate: 92  BP (Non-Invasive): (!) 193/118  Respiratory Rate: (!) 21  SpO2-1: 100 %  Pain Score (Numeric, Faces): 8  Alert and oriented x3.  Pleasant.  Does not appear to be in severe pain. Is texting on his phone.  Regular rate, no murmurs  Lungs clear to auscultation bilaterally.  Good breath sounds. No wheezing.  Abdomen is obese.  Soft.  Generalized tenderness throughout.  Extremities have +1 edema.      LABS:    Lab Results Today:    Results for orders placed or performed during the hospital encounter of 10/07/16 (from the past 24 hour(s))   ECG 12 LEAD - ED USE   Result Value Ref Range    Heart Rate 104 BPM    PR Interval 185 ms    QRS  Duration 108 ms    QT Interval 372 ms    QTC Calculation 490 ms    Calculated P Axis 61 deg    QRS Axis 5 deg    Calculated T Axis 105 deg    I 40 Axis 40 deg    T 40 Axis -23 deg    ST Axis 118 deg    EKG Severity - ABNORMAL ECG -    VENOUS BLOOD GAS   Result Value Ref Range    BICARBONATE (VENOUS) 25.2 23.0 - 33.0 mmol/L    PCO2 (VENOUS) 46.00 38.00 - 50.00 mm/Hg    PH (VENOUS) 7.38 7.32 - 7.45    PH (TEMPERATURE CORRECTED) 7.4     PO2 (VENOUS) 34.0 30.0 - 50.0 mm/Hg    PO2 (VENOUS - TEMP CORRECTED) 34.0 mm/Hg    TEMPERATURE, COMP 37.0 15.0 - 45.0 C    %FIO2 (VENOUS) 28.0 %    BASE EXCESS 1.5 -2.0 - 3.0 mmol/L    TCO2 28.6    PT/INR   Result Value Ref Range    INR 1.18 (H) 0.90 - 1.10   PTT (PARTIAL THROMBOPLASTIN TIME)   Result Value Ref Range    APTT 38.1 (H) 25.0 - 37.0 seconds   LACTIC ACID   Result Value Ref Range    LACTIC ACID 1.7 <2.0 mmol/L   CBC WITH DIFF   Result Value Ref Range    WBC 4.6 3.3 - 9.3 x10^3/uL    RBC 2.52 (L) 4.40 - 5.68 x10^6/uL    HGB 8.0 (L) 13.4 - 17.3 g/dL    HCT 16.1 (L) 09.6 - 50.5 %    MCV 95.8 (H) 82.4 - 95.0 fL    MCH 31.9 27.9 - 33.1 pg    MCHC 33.3 32.8 - 36.0  g/dL    RDW 21.3 (H) 08.6 - 15.1 %    PLATELETS 94 (L) 140 - 440 x10^3/uL    MPV 8.6 6.0 - 10.2 fL    NEUTROPHIL % 78 %    LYMPHOCYTE % 5 %    MONOCYTE % 8 %    EOSINOPHIL % 7 %    BASOPHIL % 1 %    NEUTROPHIL # 3.60 1.60 - 5.50 x10^3/uL    LYMPHOCYTE # 0.30 (L) 0.80 - 3.20 x10^3/uL    MONOCYTE # 0.40 0.20 - 0.80 x10^3/uL    EOSINOPHIL # 0.30 0.00 - 0.50 x10^3/uL    BASOPHIL # 0.00 0.00 - 0.20 x10^3/uL   TROPONIN-I   Result Value Ref Range    TROPONIN I 0.07 (HH) <=0.04 ng/mL   HEPATIC FUNCTION PANEL   Result Value Ref Range    ALBUMIN 3.5 3.2 - 4.6 g/dL    ALKALINE PHOSPHATASE 244 (H) 20 - 130 U/L    ALT (SGPT) 14 <=52 U/L    AST (SGOT) 25 <=35 U/L    BILIRUBIN TOTAL 0.5 0.3 - 1.2 mg/dL    BILIRUBIN DIRECT 0.1 <=0.3 mg/dL    PROTEIN TOTAL 5.7 (L) 6.0 - 8.3 g/dL   BASIC METABOLIC PANEL   Result Value Ref Range    SODIUM  140 135 - 145 mmol/L    POTASSIUM 4.6 3.5 - 5.0 mmol/L    CHLORIDE 103 98 - 111 mmol/L    CO2 TOTAL 24 21 - 35 mmol/L    ANION GAP 13 mmol/L    CALCIUM 7.1 (L) 8.2 - 10.2 mg/dL    GLUCOSE 578 (H) 70 - 110 mg/dL    BUN 33 (H) 10 - 25 mg/dL    CREATININE 4.69 (H) <=1.30 mg/dL    BUN/CREA RATIO 5     ESTIMATED GFR 11 (L) Avg: 99 mL/min/1.66m^2   TYPE AND SCREEN   Result Value Ref Range    UNITS ORDERED NOT STATED         ABO/RH(D) O POSITIVE     ANTIBODY SCREEN NEGATIVE     SPECIMEN EXPIRATION DATE 10/10/2016    Results for orders placed or performed during the hospital encounter of 10/06/16 (from the past 24 hour(s))   TROPONIN-I   Result Value Ref Range    TROPONIN I 86 (HH) 0 - 30 ng/L   BASIC METABOLIC PANEL   Result Value Ref Range    SODIUM 141 136 - 145 mmol/L    POTASSIUM 4.8 3.5 - 5.1 mmol/L    CHLORIDE 104 96 - 111 mmol/L    CO2 TOTAL 27 22 - 32 mmol/L    ANION GAP 10 4 - 13 mmol/L    CALCIUM 7.6 (L) 8.5 - 10.2 mg/dL    GLUCOSE 91 65 - 629 mg/dL    BUN 31 (H) 8 - 25 mg/dL    CREATININE 5.28 (H) 0.62 - 1.27 mg/dL    BUN/CREA RATIO 5 (L) 6 - 22    ESTIMATED GFR 13 (L) >59 mL/min/1.60m^2   HEPATIC FUNCTION PANEL   Result Value Ref Range    ALBUMIN 3.0 (L) 3.5 - 5.0 g/dL    ALKALINE PHOSPHATASE 258 (H) <150 U/L    ALT (SGPT) 15 <55 U/L    AST (SGOT) 24 8 - 48 U/L    BILIRUBIN TOTAL 0.6 0.3 - 1.3 mg/dL    BILIRUBIN DIRECT 0.2 <0.3 mg/dL    PROTEIN TOTAL 6.1 (L) 6.4 - 8.3 g/dL  MAGNESIUM   Result Value Ref Range    MAGNESIUM 2.0 1.6 - 2.5 mg/dL   PHOSPHORUS   Result Value Ref Range    PHOSPHORUS 4.7 2.4 - 4.7 mg/dL   PT/INR   Result Value Ref Range    PROTHROMBIN TIME 12.6 9.3 - 13.9 seconds    INR 1.09 0.80 - 1.20   CBC WITH DIFF   Result Value Ref Range    WBC 4.9 3.5 - 11.0 x10^3/uL    RBC 2.47 (L) 4.06 - 5.63 x10^6/uL    HGB 7.8 (L) 12.5 - 16.3 g/dL    HCT 16.1 (L) 09.6 - 47.0 %    MCV 94.0 78.0 - 100.0 fL    MCH 31.7 27.4 - 33.0 pg    MCHC 33.7 32.5 - 35.8 g/dL    RDW 04.5 (H) 40.9 - 15.0 %    PLATELETS 96  (L) 140 - 450 x10^3/uL    MPV 7.9 7.5 - 11.5 fL    NEUTROPHIL % 68 %    LYMPHOCYTE % 8 %    MONOCYTE % 11 %    EOSINOPHIL % 12 %    BASOPHIL % 1 %    NEUTROPHIL # 3.33 1.50 - 7.70 x10^3/uL    LYMPHOCYTE # 0.40 (L) 1.00 - 4.80 x10^3/uL    MONOCYTE # 0.52 0.30 - 1.00 x10^3/uL    EOSINOPHIL # 0.58 (H) 0.00 - 0.50 x10^3/uL    BASOPHIL # 0.06 0.00 - 0.20 x10^3/uL   TROPONIN-I   Result Value Ref Range    TROPONIN I 95 (H) 0 - 30 ng/L       Radiology Results:   Chest x-ray shows evidence of cardiomegaly and slight interstitial and vascular prominence.  No significant areas of consolidation or frank pulmonary edema seen.      There is no immunization history on file for this patient.    DNR Status:  Full Code      ASSESSMENT:    Active Hospital Problems    Diagnosis   . Chest pain     Chest pain  -patient's troponin is elevated 2.0 7.  EKG does not show any significant ischemic changes.  Will admit the patient.  Monitor on telemetry.  Follow troponins.  Given his history of CAD and elevated troponins will order stress test for the patient in the a.m..    End-stage renal disease on hemodialysis  -will consult Nephrology for continuity of hemodialysis.    Anemia chronic disease likely related end-stage renal disease  -Will monitor.  No indication for transfusion at this point.      DVT/PE Prophylaxis: Heparin  Disposition Planning: Home discharge       Lane Hacker, MD

## 2016-10-07 NOTE — ED Provider Notes (Signed)
Department of Emergency Medicine  [facility]  HPI - 10/07/2016      Chief Complaint:   Multiple complaints  History of Present Illness:   [name], 46 y.o. male   Pt reports to the ED via POV with multiple complaints. Pt reports NVD x4 days, h/o c-diff 08/28/2016, concerned it has returned. Pt sts he has 7-8 episodes of watery diarrhea with some blood. Pt sts he is unable to keep medications down, which is why he has HTN today. Pt has a h/o HTN (on Coreg, Lasix, and hydralazine). Pt also reports diffuse abdominal pain. Pt ahs a h/o of ascites, unsure of cause but has it periodically drained. Denies h/o liver disease. Pt sts his last paracentesis was 10/2015. Pt reports BLE edema as well, sts his dry weight is 104 lbs. Pt also c/o intermittent L upper CP that radiates down LUE. Pt also reports numbness to LUE. Pt has a h/o CAD (with stents). Pt reports SOB, normally on 3L O2 at home, fever (101 Max T). Pt has a h/o ESRD, on dialysis MWF, pt sts he missed dialysis 1 day ago d/t diarrhea. Pt sts [place] also [place]. Pt denies chills, cough, HA, vision changes, focal weakness, and any other sxs.     History Limitations: None    Review of Systems:   Constitutional: No fever, chills, or weakness   Skin: No rashes or diaphoresis   HENT: No congestion   Eyes: No vision changes, discharge   Cardio: No palpitations or leg swelling +CP   Respiratory: No cough, wheezing +SOB   GI:  No constipation +NVD, abdominal pain   GU:  No dysuria, hematuria, polyuria   MSK: No joint or back pain   Neuro: No focal deficits, headaches, or LOC +LUE numbness  All other symptoms reviewed and are negative    Medications:  Prior to Admission Medications   Prescriptions Last Dose Informant Patient Reported? Taking?   albuterol sulfate 90 mcg/Actuation Inhalation HFA Aerosol Inhaler oral inhaler   Yes No   Sig: Take 1-2 Puffs by inhalation Every 6 hours as needed   carvedilol (COREG) 25 mg Oral Tablet   Yes  No   Sig: Take 25 mg by mouth Twice daily with food   furosemide (LASIX) 80 mg Oral Tablet   Yes No   Sig: Take 80 mg by mouth Once a day   hydrALAZINE (APRESOLINE) 100 mg Oral Tablet   Yes No   Sig: Take 100 mg by mouth Three times a day   nitroGLYCERIN (NITROSTAT) 0.4 mg Sublingual Tablet, Sublingual   No No   Sig: 1 Tab (0.4 mg total) by Sublingual route Every 5 minutes as needed for Chest pain (please call if systolic <120 or recieved greater than 3 doses) for 3 doses over 15 minutes   sevelamer (RENAGEL) 800 mg Oral Tablet   Yes No   Sig: Take 800 mg by mouth Three times daily with meals      Facility-Administered Medications: None     Allergies:  Allergies   Allergen Reactions   . Lisinopril      Lip swelling     . Morphine      Throat swelling. Has gotten dilaudid without issue.      . Toradol [Ketorolac]      Breathing issues     Past Medical History:  Past Medical History:   Diagnosis Date   . Ascites    . Asthma    .  Clostridium difficile infection 08/28/2016    +  C Diff 08/2016   . Dialysis patient (HCC)    . HTN (hypertension)    . Kidney failure    . MI, old      Past Surgical History:  Past Surgical History:   Procedure Laterality Date   . AV FISTULA PLACEMENT     . HX CORONARY STENT PLACEMENT     . HX HERNIA REPAIR       Social History:  Social History     Social History   . Marital status: Divorced     Spouse name: N/A   . Number of children: N/A   . Years of education: N/A     Occupational History   . Not on file.     Social History Main Topics   . Smoking status: Current Every Day Smoker     Packs/day: 0.25     Types: Cigarettes   . Smokeless tobacco: Never Used   . Alcohol use No   . Drug use: No   . Sexual activity: Not on file     Other Topics Concern   . Not on file     Social History Narrative     Family History:  Family Medical History     Problem Relation (Age of Onset)    Healthy Father    Hypertension Mother        Physical Exam:  All nurse's notes reviewed.  Filed Vitals:    10/07/16  1500 10/07/16 1530 10/07/16 1541 10/07/16 1545   BP: (!) 161/105 (!) 149/96 (!) 149/91 (!) 141/102   Pulse: (!) 101 88 86 87   Resp: 20 18 13  (!) 23   Temp:       SpO2: 100% 96% 100% 100%      Constitutional: Chronically ill, hypertensive.   HENT:    Head: NC AT    Mouth/Throat: Oropharynx is clear and moist.    Eyes: PERRL, EOMI, Conjunctivae without discharge   Neck: Supple.    Cardiovascular: RRR, No murmurs, rubs or gallops.    Pulmonary/Chest: BS equal bilaterally, good air movement. No respiratory distress. No wheezes, rales or chest tenderness.    Abdominal: BS +. Abdomen soft. Diffuse TTP without peritoneal signs.          Musculoskeletal: No obvious deformity. BLE edema.    Skin: Warm and dry. No rash, erythema, pallor or cyanosis   Psychiatric: Behavior is normal. Mood and affect congruent.     Neurological: Alert&Ox3. Grossly intact.     Labs:  Results for orders placed or performed during the hospital encounter of 10/07/16 (from the past 24 hour(s))   CBC/DIFF    Narrative    The following orders were created for panel order CBC/DIFF.  Procedure                               Abnormality         Status                     ---------                               -----------         ------  CBC WITH DIFF[[id number]]                Abnormal            Final result                 Please view results for these tests on the individual orders.   PT/INR   Result Value Ref Range    INR 1.18 (H) 0.90 - 1.10   PTT (PARTIAL THROMBOPLASTIN TIME)   Result Value Ref Range    APTT 38.1 (H) 25.0 - 37.0 seconds   VENOUS BLOOD GAS   Result Value Ref Range    BICARBONATE (VENOUS) 25.2 23.0 - 33.0 mmol/L    PCO2 (VENOUS) 46.00 38.00 - 50.00 mm/Hg    PH (VENOUS) 7.38 7.32 - 7.45    PH (TEMPERATURE CORRECTED) 7.4     PO2 (VENOUS) 34.0 30.0 - 50.0 mm/Hg    PO2 (VENOUS - TEMP CORRECTED) 34.0 mm/Hg    TEMPERATURE, COMP 37.0 15.0 - 45.0 C    %FIO2 (VENOUS) 28.0 %    BASE EXCESS 1.5 -2.0 - 3.0 mmol/L     TCO2 28.6    URINALYSIS WITH REFLEX MICROSCOPIC AND CULTURE IF POSITIVE *Canceled*    Narrative    The following orders were created for panel order URINALYSIS WITH REFLEX MICROSCOPIC AND CULTURE IF POSITIVE.  Procedure                               Abnormality         Status                     ---------                               -----------         ------                     URINALYSIS, MACRO/MICRO[[id number]]                                                       Please view results for these tests on the individual orders.   LACTIC ACID   Result Value Ref Range    LACTIC ACID 1.7 <2.0 mmol/L   CBC WITH DIFF   Result Value Ref Range    WBC 4.6 3.3 - 9.3 x10^3/uL    RBC 2.52 (L) 4.40 - 5.68 x10^6/uL    HGB 8.0 (L) 13.4 - 17.3 g/dL    HCT 16.1 (L) 09.6 - 50.5 %    MCV 95.8 (H) 82.4 - 95.0 fL    MCH 31.9 27.9 - 33.1 pg    MCHC 33.3 32.8 - 36.0 g/dL    RDW 04.5 (H) 40.9 - 15.1 %    PLATELETS 94 (L) 140 - 440 x10^3/uL    MPV 8.6 6.0 - 10.2 fL    NEUTROPHIL % 78 %    LYMPHOCYTE % 5 %    MONOCYTE % 8 %    EOSINOPHIL % 7 %    BASOPHIL % 1 %    NEUTROPHIL # 3.60 1.60 - 5.50 x10^3/uL    LYMPHOCYTE # 0.30 (L)  0.80 - 3.20 x10^3/uL    MONOCYTE # 0.40 0.20 - 0.80 x10^3/uL    EOSINOPHIL # 0.30 0.00 - 0.50 x10^3/uL    BASOPHIL # 0.00 0.00 - 0.20 x10^3/uL   HEPATIC FUNCTION PANEL   Result Value Ref Range    ALBUMIN 3.5 3.2 - 4.6 g/dL    ALKALINE PHOSPHATASE 244 (H) 20 - 130 U/L    ALT (SGPT) 14 <=52 U/L    AST (SGOT) 25 <=35 U/L    BILIRUBIN TOTAL 0.5 0.3 - 1.2 mg/dL    BILIRUBIN DIRECT 0.1 <=0.3 mg/dL    PROTEIN TOTAL 5.7 (L) 6.0 - 8.3 g/dL   BASIC METABOLIC PANEL   Result Value Ref Range    SODIUM 140 135 - 145 mmol/L    POTASSIUM 4.6 3.5 - 5.0 mmol/L    CHLORIDE 103 98 - 111 mmol/L    CO2 TOTAL 24 21 - 35 mmol/L    ANION GAP 13 mmol/L    CALCIUM 7.1 (L) 8.2 - 10.2 mg/dL    GLUCOSE 563 (H) 70 - 110 mg/dL    BUN 33 (H) 10 - 25 mg/dL    CREATININE 8.75 (H) <=1.30 mg/dL    BUN/CREA RATIO 5     ESTIMATED GFR 11 (L) Avg: 99  mL/min/1.39m^2   Results for orders placed or performed during the hospital encounter of 10/06/16 (from the past 24 hour(s))   TROPONIN-I   Result Value Ref Range    TROPONIN I 86 (HH) 0 - 30 ng/L   CBC/DIFF *Canceled*    Narrative    The following orders were created for panel order CBC/DIFF.  Procedure                               Abnormality         Status                     ---------                               -----------         ------                     CBC WITH IEPP[[id number]]                                                                 Please view results for these tests on the individual orders.   BASIC METABOLIC PANEL   Result Value Ref Range    SODIUM 141 136 - 145 mmol/L    POTASSIUM 4.8 3.5 - 5.1 mmol/L    CHLORIDE 104 96 - 111 mmol/L    CO2 TOTAL 27 22 - 32 mmol/L    ANION GAP 10 4 - 13 mmol/L    CALCIUM 7.6 (L) 8.5 - 10.2 mg/dL    GLUCOSE 91 65 - 606 mg/dL    BUN 31 (H) 8 - 25 mg/dL    CREATININE 3.01 (H) 0.62 - 1.27 mg/dL    BUN/CREA RATIO 5 (L) 6 - 22    ESTIMATED GFR 13 (L) >59 mL/min/1.50m^2   CBC/DIFF  Narrative    The following orders were created for panel order CBC/DIFF.  Procedure                               Abnormality         Status                     ---------                               -----------         ------                     CBC WITH DIFF[[id number]]                Abnormal            Final result                 Please view results for these tests on the individual orders.   HEPATIC FUNCTION PANEL   Result Value Ref Range    ALBUMIN 3.0 (L) 3.5 - 5.0 g/dL    ALKALINE PHOSPHATASE 258 (H) <150 U/L    ALT (SGPT) 15 <55 U/L    AST (SGOT) 24 8 - 48 U/L    BILIRUBIN TOTAL 0.6 0.3 - 1.3 mg/dL    BILIRUBIN DIRECT 0.2 <0.3 mg/dL    PROTEIN TOTAL 6.1 (L) 6.4 - 8.3 g/dL   MAGNESIUM   Result Value Ref Range    MAGNESIUM 2.0 1.6 - 2.5 mg/dL   PHOSPHORUS   Result Value Ref Range    PHOSPHORUS 4.7 2.4 - 4.7 mg/dL   PT/INR   Result Value Ref Range    PROTHROMBIN TIME 12.6 9.3 -  13.9 seconds    INR 1.09 0.80 - 1.20    Narrative    Coumadin therapy INR range for Conventional Anticoagulation is 2.0 to 3.0 and for Intensive Anticoagulation 2.5 to 3.5.   CBC WITH DIFF   Result Value Ref Range    WBC 4.9 3.5 - 11.0 x10^3/uL    RBC 2.47 (L) 4.06 - 5.63 x10^6/uL    HGB 7.8 (L) 12.5 - 16.3 g/dL    HCT 16.1 (L) 09.6 - 47.0 %    MCV 94.0 78.0 - 100.0 fL    MCH 31.7 27.4 - 33.0 pg    MCHC 33.7 32.5 - 35.8 g/dL    RDW 04.5 (H) 40.9 - 15.0 %    PLATELETS 96 (L) 140 - 450 x10^3/uL    MPV 7.9 7.5 - 11.5 fL    NEUTROPHIL % 68 %    LYMPHOCYTE % 8 %    MONOCYTE % 11 %    EOSINOPHIL % 12 %    BASOPHIL % 1 %    NEUTROPHIL # 3.33 1.50 - 7.70 x10^3/uL    LYMPHOCYTE # 0.40 (L) 1.00 - 4.80 x10^3/uL    MONOCYTE # 0.52 0.30 - 1.00 x10^3/uL    EOSINOPHIL # 0.58 (H) 0.00 - 0.50 x10^3/uL    BASOPHIL # 0.06 0.00 - 0.20 x10^3/uL   TROPONIN-I   Result Value Ref Range    TROPONIN I 95 (H) 0 - 30 ng/L     Imaging:    Results for orders placed or performed during the hospital encounter of 10/07/16 (from the past 72 hour(s))  CT BRAIN WO IV CONTRAST     Status: None    Narrative    Victorio D Elizardo    PROCEDURE DESCRIPTION: CT BRAIN WO IV CONTRAST    PROCEDURE PERFORMED DATE AND TIME: 10/07/2016 3:26 PM    CT DOSE INFORMATION:  DLP (mGycm):  1018.20 mGy.cm    CLINICAL INDICATION: Marked hypertension, LUE numbness      FINDINGS: Unenhanced axial images of the head demonstrate normal ventricles  for age. No mass or mass-effect identified. No extra-axial collections  seen. No significant bony abnormality detected. Minimal mucosal thickening  of the paranasal sinuses most likely inflammatory. This is in the right  sphenoid sinus area.      Impression    Impression: Unremarkable unenhanced CT head.        COMPARISON:          The CT exam was performed using one or more the following a dose reduction  techniques: Automated exposure control, adjustment of the mA and/or kV  according to the patient's size, or use of iterative  reconstruction  technique.     CT ABDOMEN PELVIS WO IV CONTRAST     Status: None    Narrative    Chapin D Mateus    PROCEDURE DESCRIPTION: CT ABDOMEN PELVIS WO IV CONTRAST    PROCEDURE PERFORMED DATE AND TIME: 10/07/2016 3:29 PM    CT DOSE INFORMATION:  DLP (mGycm):  655.90 mGy.cm    CLINICAL INDICATION: abdominal pain, diarrhea      FINDINGS: Lower lung fields demonstrate minimal linear ill-defined areas  which are likely atelectasis with tiny bilateral effusions. Cardiac  silhouette is mildly prominent. At most tiny amount of pericardial fluid  which is likely upper limits of normal. Extensive abnormal edema of the  body wall in general is noted. Massive ascites is noted as well as  significant mesenteric edema. The liver and spleen do not appear to be  definitely abnormal although limited from lack of contrast. Pancreas  difficult to see but I think is within normal limits. Both kidneys are  small and extremely atrophic. Aorta shows atherosclerotic change but  nothing acute is suspected. No free air or bowel obstructive changes are  suspected. Nothing acute seen of the bony structures. Schmorl's nodule  inferior endplate of L2 noted and also probably some degenerative  irregularity of the inferior endplate of L5 on the left. The colon is not  extremely well evaluated due to lack of enteral contrast as well as  difficulty visualizing from the edema although I do not see any gross  abnormality or definite wall thickening although difficult to exclude  colitis.      Impression    Massive ascites and general significant general body wall  edema/anasarca. No free air or bowel obstruction. Etiology of the findings  is not definitely known.      COMPARISON: None          The CT exam was performed using one or more the following a dose reduction  techniques: Automated exposure control, adjustment of the mA and/or kV  according to the patient's size, or use of iterative reconstruction  technique.     XR CHEST PA AND LATERAL      Status: None    Narrative    Whitney D Alsop    PROCEDURE DESCRIPTION: XR CHEST PA AND LATERAL    PROCEDURE PERFORMED DATE AND TIME: 10/07/2016 4:06 PM    CLINICAL INDICATION: HTN, CP    TECHNIQUE: 2 views /  2 images submitted.      FINDINGS: Cardiac silhouette is mildly enlarged. There is slight prominence  of the central vasculature and interstitial markings although chronicity  not known. Minimal CP angle blunting consistent with small effusions. Mild  or early congestive changes or possible although I suspect this is possibly  chronic. Central line catheter is noted from a left-sided approach with the  tip in the area of the distal SVC/RA. A right sided vascular stent is noted  in the right upper lobe area.      Impression    Cardiomegaly and slight interstitial and vascular prominence.  Question mild or chronic congestive changes. No significant area of  consolidation or frank pulmonary edema seen. COMPARISON:         Orders Placed This Encounter   . ROUTINE STOOL CULTURE (INCLUDING E. COLI SHIGA TOXIN)   . OVA AND PARASITE SCREEN   . C. DIFFICILE PCR TESTING - [facility] ONLY   . ADULT ROUTINE BLOOD CULTURE, SET OF 2 BOTTLES (BACTERIA AND YEAST)   . ADULT ROUTINE BLOOD CULTURE, SET OF 2 BOTTLES (BACTERIA AND YEAST)   . XR CHEST PA AND LATERAL   . CT BRAIN WO IV CONTRAST   . CT ABDOMEN PELVIS WO IV CONTRAST   . CANCELED: BASIC METABOLIC PANEL   . CBC/DIFF   . PT/INR   . PTT (PARTIAL THROMBOPLASTIN TIME)   . CANCELED: TROPONIN-I   . VENOUS BLOOD GAS   . URINALYSIS WITH REFLEX MICROSCOPIC AND CULTURE IF POSITIVE   . CANCELED: URINE DRUG SCREEN   . CANCELED: HEPATIC FUNCTION PANEL   . LACTIC ACID   . LIPASE   . CBC WITH DIFF   . CANCELED: URINALYSIS, MACRO/MICRO   . TROPONIN-I   . HEPATIC FUNCTION PANEL   . BASIC METABOLIC PANEL   . ECG 12 LEAD - ED USE   . TYPE AND SCREEN   . nitroGLYCERIN (NITRO-BID) 2 % topical ointment   . ondansetron (ZOFRAN) 2 mg/mL injection   . fentaNYL (SUBLIMAZE) 50 mcg/mL injection   .  labetalol (TRANDATE) 5 mg/mL injection     Abnormal Lab results:  Labs Reviewed   PT/INR - Abnormal; Notable for the following:        Result Value    INR 1.18 (*)     All other components within normal limits   PTT (PARTIAL THROMBOPLASTIN TIME) - Abnormal; Notable for the following:     APTT 38.1 (*)     All other components within normal limits   CBC WITH DIFF - Abnormal; Notable for the following:     RBC 2.52 (*)     HGB 8.0 (*)     HCT 24.1 (*)     MCV 95.8 (*)     RDW 16.8 (*)     PLATELETS 94 (*)     LYMPHOCYTE # 0.30 (*)     All other components within normal limits   HEPATIC FUNCTION PANEL - Abnormal; Notable for the following:     ALKALINE PHOSPHATASE 244 (*)     PROTEIN TOTAL 5.7 (*)     All other components within normal limits   BASIC METABOLIC PANEL - Abnormal; Notable for the following:     CALCIUM 7.1 (*)     GLUCOSE 130 (*)     BUN 33 (*)     CREATININE 6.43 (*)     ESTIMATED GFR 11 (*)     All other components within normal limits  LACTIC ACID - Normal   ROUTINE STOOL CULTURE (INCLUDING E. COLI SHIGA TOXIN)   OVA AND PARASITE SCREEN   C. DIFFICILE PCR TESTING - [facility] ONLY   ADULT ROUTINE BLOOD CULTURE, SET OF 2 BOTTLES (BACTERIA AND YEAST)   ADULT ROUTINE BLOOD CULTURE, SET OF 2 BOTTLES (BACTERIA AND YEAST)   CBC/DIFF    Narrative:     The following orders were created for panel order CBC/DIFF.  Procedure                               Abnormality         Status                     ---------                               -----------         ------                     CBC WITH DIFF[[id number]]                Abnormal            Final result                 Please view results for these tests on the individual orders.   VENOUS BLOOD GAS   LIPASE   TROPONIN-I   TYPE AND SCREEN     ECG: sinus tachycardia, nonspecific lateral T abnormalities     Plan: Appropriate labs and imaging ordered. Medical Records reviewed.    Therapy/Procedures/Course/MDM:   Pt failed to mention he left AMA from hospital 1  day ago.  Patient was HTN but otherwise vitally stable on arrival.   Imaging remarkable for: as above  Lab results remarkable for: as above  Patient received: Fentanyl, Zofran, nitro, Labetal  Pt was signed-out to Dr. Antonietta Jewel for pending labs.  Results discussed with patient.  he had improvement with initial ED management. he was given the opportunity to ask questions.    Consults:    None  Impression: Per Dr. Omar Person  Disposition:  Per Dr. Omar Person     16:30: Care of patient will be transferred to Dr. Antonietta Jewel at this time.  They were made aware of history/physical, relevant labs/imaging and pending studies.      I am scribing for, and in the presence of, Dr. Marisa Sprinkles for services provided on 10/07/2016.  Rhett Bannister, SCRIBE   Rhett Bannister, SCRIBE  10/07/2016, 15:09    I personally performed the services described in this documentation, as scribed  in my presence, and it is both accurate  and complete.    Lynelle Doctor, MD

## 2016-10-08 ENCOUNTER — Observation Stay (HOSPITAL_COMMUNITY): Payer: Medicare Other

## 2016-10-08 ENCOUNTER — Encounter (HOSPITAL_COMMUNITY): Payer: Self-pay

## 2016-10-08 DIAGNOSIS — E1122 Type 2 diabetes mellitus with diabetic chronic kidney disease: Secondary | ICD-10-CM | POA: Diagnosis present

## 2016-10-08 DIAGNOSIS — R200 Anesthesia of skin: Secondary | ICD-10-CM | POA: Insufficient documentation

## 2016-10-08 DIAGNOSIS — Z992 Dependence on renal dialysis: Secondary | ICD-10-CM

## 2016-10-08 DIAGNOSIS — I214 Non-ST elevation (NSTEMI) myocardial infarction: Principal | ICD-10-CM | POA: Diagnosis present

## 2016-10-08 DIAGNOSIS — Z955 Presence of coronary angioplasty implant and graft: Secondary | ICD-10-CM

## 2016-10-08 DIAGNOSIS — A0472 Enterocolitis due to Clostridium difficile, not specified as recurrent: Secondary | ICD-10-CM | POA: Diagnosis present

## 2016-10-08 DIAGNOSIS — F17210 Nicotine dependence, cigarettes, uncomplicated: Secondary | ICD-10-CM | POA: Diagnosis present

## 2016-10-08 DIAGNOSIS — I2510 Atherosclerotic heart disease of native coronary artery without angina pectoris: Secondary | ICD-10-CM | POA: Diagnosis present

## 2016-10-08 DIAGNOSIS — D631 Anemia in chronic kidney disease: Secondary | ICD-10-CM | POA: Diagnosis present

## 2016-10-08 DIAGNOSIS — R197 Diarrhea, unspecified: Secondary | ICD-10-CM

## 2016-10-08 DIAGNOSIS — D6959 Other secondary thrombocytopenia: Secondary | ICD-10-CM | POA: Insufficient documentation

## 2016-10-08 DIAGNOSIS — R109 Unspecified abdominal pain: Secondary | ICD-10-CM

## 2016-10-08 DIAGNOSIS — N186 End stage renal disease: Secondary | ICD-10-CM | POA: Diagnosis present

## 2016-10-08 DIAGNOSIS — J440 Chronic obstructive pulmonary disease with acute lower respiratory infection: Secondary | ICD-10-CM | POA: Diagnosis present

## 2016-10-08 DIAGNOSIS — K7460 Unspecified cirrhosis of liver: Secondary | ICD-10-CM | POA: Diagnosis present

## 2016-10-08 DIAGNOSIS — K219 Gastro-esophageal reflux disease without esophagitis: Secondary | ICD-10-CM | POA: Diagnosis present

## 2016-10-08 DIAGNOSIS — J189 Pneumonia, unspecified organism: Secondary | ICD-10-CM | POA: Diagnosis present

## 2016-10-08 DIAGNOSIS — Z79899 Other long term (current) drug therapy: Secondary | ICD-10-CM

## 2016-10-08 DIAGNOSIS — Z5321 Procedure and treatment not carried out due to patient leaving prior to being seen by health care provider: Secondary | ICD-10-CM | POA: Diagnosis not present

## 2016-10-08 DIAGNOSIS — I120 Hypertensive chronic kidney disease with stage 5 chronic kidney disease or end stage renal disease: Secondary | ICD-10-CM | POA: Diagnosis present

## 2016-10-08 DIAGNOSIS — Z9114 Patient's other noncompliance with medication regimen: Secondary | ICD-10-CM

## 2016-10-08 LAB — ECG 12 LEAD - ED USE
Calculated P Axis: 61 deg
EKG Severity: ABNORMAL
Heart Rate: 104 {beats}/min
PR Interval: 185 ms
QRS Axis: 5 deg
QRS Duration: 108 ms
QT Interval: 372 ms
QTC Calculation: 490 ms
QTC Calculation: 490 ms
ST Axis: 118 deg

## 2016-10-08 LAB — TROPONIN-I
TROPONIN I: 0.08 ng/mL (ref <=0.04)
TROPONIN I: 0.07 ng/mL (ref <=0.04)

## 2016-10-08 LAB — BASIC METABOLIC PANEL
ANION GAP: 11 mmol/L
BUN/CREA RATIO: 5
BUN: 37 mg/dL — ABNORMAL HIGH (ref 10–25)
CALCIUM: 7 mg/dL — ABNORMAL LOW (ref 8.2–10.2)
CHLORIDE: 102 mmol/L (ref 98–111)
CO2 TOTAL: 26 mmol/L (ref 21–35)
CREATININE: 7.3 mg/dL — ABNORMAL HIGH (ref <=1.30)
ESTIMATED GFR: 10 mL/min/{1.73_m2} — ABNORMAL LOW
GLUCOSE: 84 mg/dL (ref 70–110)
POTASSIUM: 4.7 mmol/L (ref 3.5–5.0)
POTASSIUM: 4.7 mmol/L (ref 3.5–5.0)
SODIUM: 139 mmol/L (ref 135–145)

## 2016-10-08 LAB — ECG 12 LEAD - ADULT
Calculated P Axis: 55 deg
Calculated T Axis: 89 deg
EKG Severity: ABNORMAL
Heart Rate: 89 {beats}/min
PR Interval: 197 ms
QRS Axis: 7 deg
QT Interval: 405 ms
ST Axis: 142 deg

## 2016-10-08 LAB — CBC WITH DIFF
BASOPHIL #: 0.1 x10ˆ3/uL (ref 0.00–0.20)
BASOPHIL %: 1 %
EOSINOPHIL #: 0.5 x10ˆ3/uL (ref 0.00–0.50)
EOSINOPHIL %: 11 %
HCT: 24.4 % — ABNORMAL LOW (ref 38.9–50.5)
HGB: 8.1 g/dL — ABNORMAL LOW (ref 13.4–17.3)
LYMPHOCYTE #: 0.5 10*3/uL — ABNORMAL LOW (ref 0.80–3.20)
LYMPHOCYTE #: 0.5 x10ˆ3/uL — ABNORMAL LOW (ref 0.80–3.20)
LYMPHOCYTE %: 9 %
MCH: 31.9 pg (ref 27.9–33.1)
MCHC: 33.3 g/dL (ref 32.8–36.0)
MCV: 95.9 fL — ABNORMAL HIGH (ref 82.4–95.0)
MCV: 95.9 fL — ABNORMAL HIGH (ref 82.4–95.0)
MONOCYTE #: 0.4 x10ˆ3/uL (ref 0.20–0.80)
MONOCYTE %: 8 %
MPV: 8.5 fL (ref 6.0–10.2)
NEUTROPHIL #: 3.5 x10ˆ3/uL (ref 1.60–5.50)
NEUTROPHIL %: 71 %
PLATELETS: 97 x10ˆ3/uL — ABNORMAL LOW (ref 140–440)
RBC: 2.55 10*6/uL — ABNORMAL LOW (ref 4.40–5.68)
RDW: 17 % — ABNORMAL HIGH (ref 10.9–15.1)
WBC: 4.9 x10ˆ3/uL (ref 3.3–9.3)

## 2016-10-08 LAB — PT/INR: INR: 1.13 — ABNORMAL HIGH (ref 0.90–1.10)

## 2016-10-08 LAB — MAGNESIUM: MAGNESIUM: 1.9 mg/dL (ref 1.8–2.3)

## 2016-10-08 LAB — PHOSPHORUS: PHOSPHORUS: 5 mg/dL — ABNORMAL HIGH (ref 2.5–4.5)

## 2016-10-08 MED ORDER — HEPARIN (PORCINE) 1,000 UNIT/ML INJECTION SOLUTION
4400.0000 [IU] | INTRAMUSCULAR | Status: AC
Start: 2016-10-08 — End: 2016-10-08
  Administered 2016-10-08: 4400 [IU]

## 2016-10-08 MED ORDER — ASPIRIN 81 MG TABLET,DELAYED RELEASE
81.0000 mg | DELAYED_RELEASE_TABLET | Freq: Every day | ORAL | Status: DC
Start: 2016-10-08 — End: 2016-10-08
  Filled 2016-10-08: qty 1

## 2016-10-08 MED ADMIN — metroNIDAZOLE 500 mg/100 mL-sodium chloride(iso) intravenous piggyback: INTRAVENOUS

## 2016-10-08 MED ADMIN — HYDROmorphone 0.5 mg/0.5 mL injection syringe: INTRAVENOUS | @ 03:00:00

## 2016-10-08 NOTE — Nurses Notes (Signed)
Patient asked for an increase in pain medication multiple times throughout the night. Vital signs were WDL. Pt observed to be sleeping or groggy right before asking for pain meds. Patient remained on contact precautions for C Diff. BP (!) 155/86  Pulse 92  Temp 36.7 C (98.1 F)  Resp 19  Ht 1.854 m (6\' 1" )  Wt 111.1 kg (245 lb)  SpO2 100%  BMI 32.32 kg/m2  Todd Newnesshristie Griffey Nicasio, LPN

## 2016-10-08 NOTE — Nurses Notes (Signed)
Pt returned from stress test because pt is having chest pain, bp elevated. On arrival to the floor, pt rates chest pain 10/10 pressure that radiates to the left arm. Pt's breath sounds are coarse and states he feels short of breath. BP now 165/111 oxygen 100% on 3L. EKG done, see epic. Paged Dr. Omar PersonMace, he states pt needs dialysis. Dr. Krista Blueemarco consulted. Lyndee HensenBrenda Helix Lafontaine, RN

## 2016-10-08 NOTE — Discharge Summary (Signed)
DISCHARGE SUMMARY      PATIENT NAME:  [name],[name]  MRN:  [id number]  DOB:  1970-11-09    ADMISSION DATE:  10/07/2016  DISCHARGE DATE:  10/08/2016    ATTENDING PHYSICIAN: Dr. Lane HackerLaco Mace  [facility]  PRIMARY CARE PHYSICIAN: No Pcp     Reason for Admission     Diagnosis    Chest pain [125822]          DISCHARGE DIAGNOSIS: Chest pain    Principal Problem:  No att. providers found    Active Hospital Problems    Diagnosis Date Noted   . Chest pain 09/12/2016      Resolved Hospital Problems    Diagnosis    No resolved problems to display.     Active Non-Hospital Problems    Diagnosis Date Noted   . Diarrhea 10/08/2016   . 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 Abdominal pain 10/08/2016   . Angina at rest [facility](HCC) 10/06/2016   . Anemia in CKD (chronic kidney disease) 09/12/2016   . Smoker 09/12/2016   . Volume overload 08/26/2016   . ESRD (end stage renal disease) on dialysis (HCC) 08/26/2016   . Noncompliance with renal dialysis (HCC) 08/26/2016   . C. difficile diarrhea 08/26/2016   . Accelerated hypertension 08/26/2016      Allergies   Allergen Reactions   . Lisinopril      Lip swelling     . Morphine      Throat swelling. Has gotten dilaudid without issue.      . Toradol [Ketorolac]      Breathing issues        INVESTIGATIONS:  Labs:  CBC  (Last 24 hours)    Date/Time WBC HGB HCT MCV PLATELETS    10/08/16 0239 4.9    8.1 (L)    24.4 (L)    95.9 (H)    97 (L)           BMP  (Last 24 hours)    Date/Time Na K Cl CO2 BUN CREAT Calcium Glucose    10/08/16 0239 139    4.7    102    26    37 (H)    7.30 (H)    7.0 (L)    84           Recent Labs      10/08/16   0239   MAGNESIUM  1.9   PHOSPHORUS  5.0*     Recent Labs      10/08/16   0239   INR  1.13*     TROPONIN I   Date Value Ref Range Status   10/08/2016 0.07 (HH) <=0.04 ng/mL Final   10/08/2016 0.08 (HH) <=0.04 ng/mL Final   10/07/2016 0.07 (HH) <=0.04 ng/mL Final         Imaging:         DISCHARGE MEDICATIONS:     Current Discharge Medication List       CONTINUE these medications - NO CHANGES were made during your visit.       Details    albuterol sulfate 90 mcg/Actuation HFA Aerosol Inhaler oral inhaler    1-2 Puffs, Inhalation, Q6H PRN   Refills:  0       carvedilol 25 mg Tablet   Commonly known as:  COREG    25 mg, Oral, 2x/day-Food   Refills:  0       furosemide 80 mg Tablet   Commonly known  as:  LASIX    80 mg, Oral, Daily   Refills:  0       hydrALAZINE 100 mg Tablet   Commonly known as:  APRESOLINE    100 mg, Oral, 3x/day   Refills:  0       RENAGEL 800 mg Tablet   Generic drug:  sevelamer    800 mg, Oral, 3x/day-Meals   Refills:  0           Discharge med list refreshed?  YES        DISCHARGE INSTRUCTIONS:    No discharge procedures on file.       REASON FOR HOSPITALIZATION AND HOSPITAL COURSE:  This is a 46 y.o.,  Black/African American male who presents to [facility] with complaints of chest pain.  He states the chest pain has been ongoing for several days now.  Of note he recently signed out from [facility] against medical advice after he stated they were not doing anything for him. He was told at [facility] that he needed a stress test but he refused that.  He is willing to undergo that testing here. He states that he does have a history of CAD with history of 2 stents in the past.  He also states he has been having increasing nausea and vomiting recently.  Because of this he has been able to keep any thing down.  He also states because of this he missed hemodialysis on Wednesday.  He is here [facility] today for 2nd opinion. Of note patient states that he has a history of ascites.  He is isn't sure of the cause of the ascites.  He denies a history of liver disease. He states that he frequently has paracentesis.  Was admitted to rule out ACS.  Troponins remained mildly elevated without peak-possibly secondary to ESRD.  He continued to complain of chest pain.   Stated he would not go to dialysis without eating lunch which would prevent him  from having the stress test.  Gave him lunch and consulted cardiology.  Attempted to refused dialysis but eventually went to be dialyzed.  Was given IV Dilaudid for pain but states he usually gets higher dose when hospitalized with chronic C diff-discussed that we would not increased pain medicine.  Patient was not happy with care and signed out AMA after dialysis treatment.        PHYSICAL EXAM: Nursing note and vitals reviewed.   Constitutional: Patient is in no acute distress.  Permacath to chest  Lungs: Clear to auscultation bilaterally without wheezes, rales or rhonchi.  Cardiovascular: Regular rate and rhythm. No murmur, rub, or gallop.   Abdomen: Normal bowel sounds. Soft, nontender, nondistended. No rebound, Guarding, or masses noted.   MSK/Extremities: Moves all extremities.     Psych: Alert andoriented to person, place, and time. Normal affect      CONDITION ON DISCHARGE: Improved and stable.      DISCHARGE DISPOSITION:  Home discharge and AMA              Earl Gala, FNP-BC  10/08/2016, 20:48    Copies sent to Care Team       Relationship Specialty Notifications Start End    Pcp, No PCP - General   10/06/16           Referring providers can utilize [email or web address] access their referred [employer] patient's information.

## 2016-10-08 NOTE — Care Plan (Signed)
Pt resting in bed. Continues to have chest pain, MD not planning on increasing dose of pain meds. Troponin trending down. Pt could not go to stress test due to having chest pain. MD cancelled it and let pt eat before dialysis. Pt will go to dialysis again tomorrow. VS stable BP (!) 186/99  Pulse 92  Temp 36.8 C (98.2 F)  Resp 19  Ht 1.854 m (6\' 1" )  Wt 111.1 kg (245 lb)  SpO2 100%  BMI 32.32 kg/m2 Lyndee HensenBrenda Mayar Whittier, RN

## 2016-10-08 NOTE — Care Plan (Signed)
Problem: Patient Care Overview (Adult,OB)  Goal: Plan of Care Review(Adult,OB)  The patient and/or their representative will communicate an understanding of their plan of care   Outcome: Ongoing (see interventions/notes)    Goal: Individualization/Patient Specific Goal(Adult/OB)  Outcome: Ongoing (see interventions/notes)    Goal: Interdisciplinary Rounds/Family Conf  Outcome: Ongoing (see interventions/notes)      Problem: Skin Injury Risk (Adult,Obstetrics,Pediatric)  Goal: Skin Health and Integrity  Patient will demonstrate the desired outcomes by discharge/transition of care.   Outcome: Ongoing (see interventions/notes)      Problem: Pain, Acute (Adult)  Goal: Acceptable Pain Control/Comfort Level  Patient will demonstrate the desired outcomes by discharge/transition of care.   Outcome: Ongoing (see interventions/notes)      Problem: Fall Risk (Adult)  Goal: Absence of Falls  Patient will demonstrate the desired outcomes by discharge/transition of care.   Outcome: Ongoing (see interventions/notes)      Problem: Kidney Disease, Chronic/End Stage Renal Disease (Adult)  Prevent and manage potential problems including:  1. acid-base imbalance  2. cardiovascular disease  3. chronic pain  4. drug/nephrotoxicity  5. electrolyte imbalance  6. fluid imbalance  7. functional decline/self-care deficit  8. hematologic complications  9. hypertension  10. infection  11. pulmonary edema  12. renal osteodystrophy  13. situational response  14. undernutrition  15. uremic syndrome  16. vascular access complications   Goal: Signs and Symptoms of Listed Potential Problems Will be Absent, Minimized or Managed (Kidney Disease, Chronic/End Stage Renal Disease)  Signs and symptoms of listed potential problems will be absent, minimized or managed by discharge/transition of care (reference Kidney Disease, Chronic/End Stage Renal Disease (Adult) CPG).   Outcome: Ongoing (see interventions/notes)

## 2016-10-08 NOTE — Nurses Notes (Signed)
Pt leaving AMA. Pt "unhappy with doctors here". IV removed. AMA paper signed. Lyndee HensenBrenda Demetries Coia, RN

## 2016-10-08 NOTE — Consults (Signed)
Pt seen note dictated, for hd today, was being referred to fecal transplant center for c diff.

## 2016-10-08 NOTE — Consults (Signed)
1 chest pain atypical, troponin are stable now pain free    2. ESRD ON HD    3. ASCITES NEEDING RECURRENT DRAINAGE    4. NON COMPLAINT HE QUIT TAKING ASPIRIN, HE TOOK DID NOT HAD ANY REACTION EXCEPT MILD BLEEDING AT DLYSIS    HE WANTS TO GO BACK ON ASPIRIN AND TOLD TO CALL IF ANY SYMPTOMS, IT APPEARS HE MIGHT LEAVE AMA AND TOLD TO SEE ME IN OFFICE

## 2016-10-08 NOTE — Consults (Signed)
PATIENT NAME: [name], Jawanza D  HOSPITAL NUMBER:  [id number]  DATE OF SERVICE: 10/08/2016  DATE OF BIRTH:  14-Nov-1970    CONSULTATION    ATTENDING PHYSICIAN:  Lane HackerLaco Mace, MD.    REASON FOR CONSULT:  Elevated BUN and creatinine.    HISTORY:  This is a 46 year old gentleman who came to the hospital for abdominal pain.    PAST MEDICAL HISTORY:  He has been on dialysis 8 years.  He was getting [place] in [place], [place].  He recently tried to move to [place] in [place], [place].  He had several visits to the Emergency Department and a couple brief admissions, 1 terminated admission due to leaving against medical advice.  His home dialysis center in [place] was terminated due to a voluntary discharge from noncompliance.  He has, again, been on dialysis 8 years.  He has had a shunt in his arm which was removed due to infection.  He has had as many bouts, I think about a dozen episodes of C diff colitis and infectious disease consult recently recommended referral to a fecal transplant center.    MEDICATIONS:  Medications reviewed.    SOCIAL HISTORY:  He does have a history of smoking.    REVIEW OF SYSTEMS:  He has a PermCath for dialysis.    PHYSICAL EXAMINATION:  On exam, he is sitting in upright on the edge of the bed on oxygen.  No new or specific complaints, a little bit short of breath, complaining of some chest and abdominal pains, some swelling, also I believe he has history of cirrhosis.  In no acute distress.  Head and neck:  No JVD.  Heart:  Regular with no rub.  Lungs:  Clear, no wheezing.  Abdomen:  A little bit of fluid build up.  Extremities:  Some edema in his legs.  Neuro:  No asterixis.    LABORATORY AND X-RAY DATA:  White blood count 4.9, hemoglobin 8.1, platelets 97,000.  Sodium 139, potassium 4.7, BUN 37, creatinine 7.3, calcium 7.0.  Troponin 0.08.  Albumin 3.0.  INR 1.13.  C diff toxin assay positive.  Blood sugar 130.  Chest x-ray just showed some mild edema.    IMPRESSION:   End-stage renal disease, dialyzed through a PermCath.   Recurrent Clostridium difficile colitis.   Mild fluid overload. He just missed Wednesday dialysis treatments this week.  He did a run on Monday missed Wednesday and today is Friday.    SUGGESTIONS:  Hemodialysis today to try to take off his usual fluid amount that he gains between dialysis.  Adjust any new medications for low GFR.     Thank you for this consult.        Crista CurbJames Kelani Robart, MD                DD:  10/08/2016 11:39:54  DT:  10/08/2016 12:47:04 CW  D#:  [id number]

## 2016-10-08 NOTE — Nurses Notes (Signed)
Paged Dr Remi DeterSamuel - [name] - Troponin increased to 0.08 from 0.07. Pt requesting an increase in pain medication. Thank you [phone]

## 2016-10-08 NOTE — Consults (Signed)
PATIENT NAME: [name], [name]  HOSPITAL NUMBER:  [id number]  DATE OF SERVICE: 10/08/2016  DATE OF BIRTH:  [birth year]    CONSULTATION    REQUESTING PHYSICIAN:  Lane Hacker, MD    REASON FOR CONSULTATION:  Chest pain.    HISTORY OF PRESENT ILLNESS:  A 46 year old male, with history of CAD and also multiple other problems, including renal failure on dialysis and also hepatic failure with ascites.  He does get peritoneal fluid drained frequently.  The last time he thinks it was a week ago.  The patient does live in [place].  He came to visit his friend about a week ago, and he has pain in the belly, chest pain, abdominal pain, shortness of breath.  When I saw him, he was completely pain free.  The patient, I believe, was sent downstairs for MP scan, and because of chest pain  they canceled.  His blood pressure also high.  The  patient's troponins are stable at 0.07, otherwise 0.08.  EKG showed normal sinus rhythm.  No acute ST elevations, some nonspecific ST-T waves.  The patient has a history of CAD.  He had a stent.  The last time he thinks he had was in 2010, and he quit taking aspirin.  He has not seen a cardiologist.  He has not taken aspirin since that time.  He thinks he bleeds easily, so he thinks the kidney doctor stopped his aspirin.  No recent cardiac evaluations.    PAST MEDICAL HISTORY:  1. End-stage renal disease, on dialysis.  2. History of ascites.  3. History of C. difficile infection.   4. History of CAD, MI.   5. History of thrombocytopenia.    MEDICATIONS:  1. Hydralazine.   2. Coreg.    REVIEW OF SYSTEMS:  HEENT:  Showed no hearing problem.  Lungs:  History of COPD.  Cardiovascular:  History of MI.  Quit taking all platelet drugs.  Gastrointestinal:  History of recurrent ascites, hepatomegaly.  He has some liver damage.  He also had liver biopsy.  Central nervous system:  No stroke.  Vascular:  He has a fistula.    SOCIAL HISTORY:  He does smoke.    PHYSICAL EXAMINATION:  General:  Middle-aged black  male, alert, awake, oriented x3, in no acute distress.  Vital signs:  Heart rate is 92, blood pressure 186/99, respirations 20.  HEENT:  Showed no JVD.  Lungs:  Good air entry.  No crackles. Cardiovascular:  Heart sounds were regular.  He has a systolic murmur in the apex.  Abdomen:  He has ascites.  Extremities:  Mild ankle swelling.  Also has a fistula.    IMPRESSION:  1. Chest pain, constant, sounds atypical.  Does have abdominal pain, acid reflux disease.  Troponins are stable.  2. Coronary artery disease, history of myocardial infarction.  He quit taking his aspirin.  The last time he saw cardiology, he thinks about 2010.  3. Endstage renal disease, on dialysis.   He was in [place].  He is visiting his friend.  Now he is thinking of moving to [place].  He saw Dr. Krista Blue.    4. Ascites, needing recurrent drainage.  He had 1 about a week ago.  He says he gets every week.   5. Abdominal pain.  He is a little frustrated because he thinks nobody is doing anything.  He may leave AMA.    PLAN:  I told the patient not to leave AMA.  The patient is  stable.  Dr. Krista BlueeMarco is adjusting his blood pressure medications.  I told if he does go home, he should see me in my office.  I will be happy to take care of his cardiac condition.  Spoke of the risks and benefits of aspirin, he is willing to take it.        Todd CarnesSathyanarayan Alando Colleran, MD                DD:  10/08/2016 18:22:39  DT:  10/08/2016 19:37:45 LB  D#:  [id number]

## 2016-10-09 ENCOUNTER — Encounter (HOSPITAL_COMMUNITY): Payer: Self-pay

## 2016-10-09 ENCOUNTER — Inpatient Hospital Stay (HOSPITAL_COMMUNITY): Payer: Medicare Other | Admitting: FAMILY PRACTICE

## 2016-10-09 ENCOUNTER — Inpatient Hospital Stay
Admission: AD | Admit: 2016-10-09 | Discharge: 2016-10-09 | DRG: 280 | Discharge status: Against Medical Advice | Payer: Medicare Other | Source: Other Acute Inpatient Hospital | Attending: FAMILY PRACTICE | Admitting: FAMILY PRACTICE

## 2016-10-09 DIAGNOSIS — J189 Pneumonia, unspecified organism: Secondary | ICD-10-CM | POA: Diagnosis present

## 2016-10-09 LAB — ECG 12 LEAD - ADULT
Calculated P Axis: 52 deg
Calculated T Axis: 93 deg
EKG Severity: ABNORMAL
Heart Rate: 91 {beats}/min
I 40 Axis: 32 deg
PR Interval: 205 ms
QRS Axis: -1 deg
QRS Duration: 96 ms
QT Interval: 391 ms
QTC Calculation: 482 ms
ST Axis: 134 deg
T 40 Axis: -23 deg

## 2016-10-09 LAB — CBC WITH DIFF
BASOPHIL #: 0 x10ˆ3/uL (ref 0.00–0.20)
BASOPHIL #: 0.1 10*3/uL (ref 0.00–0.20)
BASOPHIL %: 1 %
BASOPHIL %: 1 %
BASOPHIL %: 1 %
EOSINOPHIL #: 0.6 x10ˆ3/uL — ABNORMAL HIGH (ref 0.00–0.50)
EOSINOPHIL #: 0.6 10*3/uL — ABNORMAL HIGH (ref 0.00–0.50)
EOSINOPHIL %: 12 %
EOSINOPHIL %: 13 %
HCT: 23.8 % — ABNORMAL LOW (ref 38.9–50.5)
HCT: 22.9 % — ABNORMAL LOW (ref 38.9–50.5)
HGB: 7.9 g/dL — ABNORMAL LOW (ref 13.4–17.3)
HGB: 7.9 g/dL — ABNORMAL LOW (ref 13.4–17.3)
HGB: 7.7 g/dL — ABNORMAL LOW (ref 13.4–17.3)
LYMPHOCYTE #: 0.4 x10?3/uL — ABNORMAL LOW (ref 0.80–3.20)
LYMPHOCYTE #: 0.4 x10ˆ3/uL — ABNORMAL LOW (ref 0.80–3.20)
LYMPHOCYTE #: 0.4 10*3/uL — ABNORMAL LOW (ref 0.80–3.20)
LYMPHOCYTE %: 8 %
LYMPHOCYTE %: 8 %
MCH: 32 pg (ref 27.9–33.1)
MCH: 32 pg (ref 27.9–33.1)
MCHC: 33.4 g/dL (ref 32.8–36.0)
MCHC: 33.5 g/dL (ref 32.8–36.0)
MCV: 95.8 fL — ABNORMAL HIGH (ref 82.4–95.0)
MCV: 95.5 fL — ABNORMAL HIGH (ref 82.4–95.0)
MONOCYTE #: 0.3 x10ˆ3/uL (ref 0.20–0.80)
MONOCYTE #: 0.3 10*3/uL (ref 0.20–0.80)
MONOCYTE %: 7 %
MONOCYTE %: 7 %
MPV: 8.7 fL (ref 6.0–10.2)
MPV: 8.5 fL (ref 6.0–10.2)
NEUTROPHIL #: 3.3 x10ˆ3/uL (ref 1.60–5.50)
NEUTROPHIL #: 3.4 10*3/uL (ref 1.60–5.50)
NEUTROPHIL %: 71 %
NEUTROPHIL %: 72 %
PLATELETS: 107 x10ˆ3/uL — ABNORMAL LOW (ref 140–440)
PLATELETS: 101 10*3/uL — ABNORMAL LOW (ref 140–440)
RBC: 2.48 x10ˆ6/uL — ABNORMAL LOW (ref 4.40–5.68)
RBC: 2.4 10*6/uL — ABNORMAL LOW (ref 4.40–5.68)
RDW: 17.4 % — ABNORMAL HIGH (ref 10.9–15.1)
RDW: 16.2 % — ABNORMAL HIGH (ref 10.9–15.1)
WBC: 4.6 10*3/uL (ref 3.3–9.3)
WBC: 4.7 10*3/uL (ref 3.3–9.3)

## 2016-10-09 LAB — BASIC METABOLIC PANEL
ANION GAP: 9 mmol/L
ANION GAP: 9 mmol/L
BUN/CREA RATIO: 5
BUN: 28 mg/dL — ABNORMAL HIGH (ref 10–25)
CALCIUM: 7.1 mg/dL — ABNORMAL LOW (ref 8.2–10.2)
CHLORIDE: 103 mmol/L (ref 98–111)
CO2 TOTAL: 26 mmol/L (ref 21–35)
CREATININE: 5.88 mg/dL — ABNORMAL HIGH (ref <=1.30)
ESTIMATED GFR: 13 mL/min/1.73mˆ2 — ABNORMAL LOW
GLUCOSE: 79 mg/dL (ref 70–110)
POTASSIUM: 4.5 mmol/L (ref 3.5–5.0)
SODIUM: 138 mmol/L (ref 135–145)

## 2016-10-09 LAB — STERILE SITE CULTURE AND GRAM STAIN, AEROBIC
FLC: NO GROWTH
GRAM STAIN: NONE SEEN
GRAM STAIN: NONE SEEN

## 2016-10-09 LAB — ADULT ROUTINE BLOOD CULTURE, SET OF 2 BOTTLES (BACTERIA AND YEAST)
BLOOD CULTURE, ROUTINE: NO GROWTH
BLOOD CULTURE, ROUTINE: NO GROWTH

## 2016-10-09 LAB — TROPONIN-I: TROPONIN I: 0.06 ng/mL (ref <=0.04)

## 2016-10-09 MED ORDER — VANCOMYCIN IV - PHARMACIST TO DOSE PER PROTOCOL
Freq: Every day | Status: DC | PRN
Start: 2016-10-09 — End: 2016-10-09

## 2016-10-09 MED ORDER — ONDANSETRON HCL (PF) 4 MG/2 ML INJECTION SOLUTION
4.00 mg | Freq: Three times a day (TID) | INTRAMUSCULAR | Status: DC | PRN
Start: 2016-10-09 — End: 2016-10-09
  Administered 2016-10-09: 4 mg via INTRAVENOUS
  Filled 2016-10-09: qty 2

## 2016-10-09 MED ORDER — SODIUM CHLORIDE 0.9 % (FLUSH) INJECTION SYRINGE
3.0000 mL | INJECTION | INTRAMUSCULAR | Status: DC | PRN
Start: 2016-10-09 — End: 2016-10-09

## 2016-10-09 MED ORDER — ALBUTEROL SULFATE 2.5 MG/3 ML (0.083 %) SOLUTION FOR NEBULIZATION
2.50 mg | INHALATION_SOLUTION | Freq: Four times a day (QID) | RESPIRATORY_TRACT | Status: DC | PRN
Start: 2016-10-09 — End: 2016-10-09

## 2016-10-09 MED ORDER — NITROGLYCERIN 2 % TRANSDERMAL OINTMENT - PACKET
2.00 [in_us] | TOPICAL_OINTMENT | Freq: Four times a day (QID) | TRANSDERMAL | Status: DC
Start: 2016-10-09 — End: 2016-10-09
  Administered 2016-10-09 (×2): 2 [in_us] via TOPICAL
  Filled 2016-10-09: qty 60

## 2016-10-09 MED ORDER — HEPARIN (PORCINE) 1,000 UNIT/ML INJECTION SOLUTION
4400.0000 [IU] | INTRAMUSCULAR | Status: AC
Start: 2016-10-09 — End: 2016-10-09
  Administered 2016-10-09: 4400 [IU]

## 2016-10-09 MED ORDER — HYDRALAZINE 50 MG TABLET
100.00 mg | ORAL_TABLET | Freq: Three times a day (TID) | ORAL | Status: DC
Start: 2016-10-09 — End: 2016-10-09
  Administered 2016-10-09: 0 mg via ORAL
  Filled 2016-10-09 (×7): qty 2

## 2016-10-09 MED ORDER — HEPARIN (PORCINE) 10,000 UNIT/ML INJECTION SOLUTION
5000.0000 [IU] | Freq: Two times a day (BID) | INTRAMUSCULAR | Status: DC
Start: 2016-10-09 — End: 2016-10-09
  Administered 2016-10-09: 0 [IU] via SUBCUTANEOUS
  Filled 2016-10-09 (×4): qty 0.5

## 2016-10-09 MED ORDER — CARVEDILOL 25 MG TABLET
25.00 mg | ORAL_TABLET | Freq: Two times a day (BID) | ORAL | Status: DC
Start: 2016-10-09 — End: 2016-10-09
  Administered 2016-10-09 (×2): 0 mg via ORAL
  Filled 2016-10-09 (×4): qty 1

## 2016-10-09 MED ORDER — PANTOPRAZOLE 40 MG TABLET,DELAYED RELEASE
40.00 mg | DELAYED_RELEASE_TABLET | Freq: Every morning | ORAL | Status: DC
Start: 2016-10-09 — End: 2016-10-09
  Administered 2016-10-09: 0 mg via ORAL
  Filled 2016-10-09 (×3): qty 1

## 2016-10-09 MED ORDER — VANCOMYCIN 10 GRAM INTRAVENOUS SOLUTION
15.0000 mg/kg | Freq: Once | INTRAVENOUS | Status: AC
Start: 2016-10-09 — End: 2016-10-09
  Administered 2016-10-09: 0 mg via INTRAVENOUS
  Administered 2016-10-09 (×2): 1500 mg via INTRAVENOUS
  Filled 2016-10-09: qty 15

## 2016-10-09 MED ORDER — VANCOMYCIN 10 GRAM INTRAVENOUS SOLUTION
15.00 mg/kg | INTRAVENOUS | Status: DC
Start: 2016-10-09 — End: 2016-10-09
  Filled 2016-10-09 (×2): qty 15

## 2016-10-09 MED ORDER — SODIUM CHLORIDE 0.9 % INTRAVENOUS SOLUTION
15.00 mg/kg | INTRAVENOUS | Status: DC
Start: 2016-10-09 — End: 2016-10-09
  Filled 2016-10-09 (×2): qty 15

## 2016-10-09 MED ORDER — SODIUM CHLORIDE 0.9 % (FLUSH) INJECTION SYRINGE
3.0000 mL | INJECTION | Freq: Three times a day (TID) | INTRAMUSCULAR | Status: DC
Start: 2016-10-09 — End: 2016-10-09
  Administered 2016-10-09: 3 mL

## 2016-10-09 MED ORDER — PIPERACILLIN-TAZOBACTAM 3.375 GRAM/50 ML DEXTROSE(ISO-OS) IV PIGGYBACK
3.3750 g | INJECTION | Freq: Two times a day (BID) | INTRAVENOUS | Status: DC
Start: 2016-10-09 — End: 2016-10-09
  Administered 2016-10-09: 3.375 g via INTRAVENOUS
  Administered 2016-10-09: 0 g via INTRAVENOUS
  Filled 2016-10-09 (×3): qty 50

## 2016-10-09 MED ORDER — PIPERACILLIN-TAZOBACTAM 3.375 GRAM/50 ML DEXTROSE(ISO-OS) IV PIGGYBACK
3.3750 g | INJECTION | Freq: Two times a day (BID) | INTRAVENOUS | Status: DC
Start: 2016-10-09 — End: 2016-10-09
  Filled 2016-10-09 (×3): qty 50

## 2016-10-09 MED ORDER — NITROGLYCERIN 2 % TRANSDERMAL OINTMENT - PACKET
0.50 [in_us] | TOPICAL_OINTMENT | Freq: Two times a day (BID) | TRANSDERMAL | Status: DC
Start: 2016-10-09 — End: 2016-10-09
  Administered 2016-10-09: 0.5 [in_us] via TOPICAL
  Filled 2016-10-09: qty 60

## 2016-10-09 MED ORDER — ALBUTEROL SULFATE HFA 90 MCG/ACTUATION AEROSOL INHALATION - EAST
2.00 | INHALATION_SPRAY | Freq: Four times a day (QID) | RESPIRATORY_TRACT | Status: DC | PRN
Start: 2016-10-09 — End: 2016-10-09
  Filled 2016-10-09: qty 8

## 2016-10-09 MED ORDER — HYDRALAZINE 10 MG TABLET
10.00 mg | ORAL_TABLET | Freq: Three times a day (TID) | ORAL | Status: DC
Start: 2016-10-09 — End: 2016-10-09
  Filled 2016-10-09 (×4): qty 1

## 2016-10-09 MED ORDER — HEPARIN (PORCINE) 1,000 UNIT/ML INJECTION SOLUTION
2000.0000 [IU] | INTRAMUSCULAR | Status: AC
Start: 2016-10-09 — End: 2016-10-09
  Administered 2016-10-09 (×2): 2000 [IU] via INTRAVENOUS

## 2016-10-09 MED ORDER — VANCOMYCIN 50 MG/ML ORAL LIQUID WITH FLAVORING
125.00 mg | Freq: Four times a day (QID) | ORAL | Status: DC
Start: 2016-10-09 — End: 2016-10-09
  Filled 2016-10-09 (×3): qty 3

## 2016-10-09 MED ORDER — HYDROMORPHONE 0.5 MG/0.5 ML INJECTION SYRINGE
0.50 mg | INJECTION | INTRAMUSCULAR | Status: DC | PRN
Start: 2016-10-09 — End: 2016-10-09
  Administered 2016-10-09 (×3): 0.5 mg via INTRAVENOUS
  Filled 2016-10-09: qty 0.5
  Filled 2016-10-09 (×2): qty 1

## 2016-10-09 MED ORDER — SEVELAMER HCL 800 MG TABLET
800.00 mg | ORAL_TABLET | Freq: Three times a day (TID) | ORAL | Status: DC
Start: 2016-10-09 — End: 2016-10-09
  Administered 2016-10-09: 800 mg via ORAL
  Filled 2016-10-09 (×6): qty 1

## 2016-10-09 MED ORDER — METRONIDAZOLE 500 MG/100 ML IN SODIUM CHLOR(ISO) INTRAVENOUS PIGGYBACK
500.00 mg | INJECTION | Freq: Three times a day (TID) | INTRAVENOUS | Status: DC
Start: 2016-10-09 — End: 2016-10-09
  Filled 2016-10-09 (×3): qty 100

## 2016-10-09 MED ORDER — FUROSEMIDE 80 MG TABLET
80.0000 mg | ORAL_TABLET | Freq: Every day | ORAL | Status: DC
Start: 2016-10-09 — End: 2016-10-09
  Administered 2016-10-09: 80 mg via ORAL
  Filled 2016-10-09 (×2): qty 1

## 2016-10-09 MED ORDER — SODIUM CHLORIDE 0.9 % IV BOLUS
40.00 mL | INJECTION | Freq: Once | Status: DC | PRN
Start: 2016-10-09 — End: 2016-10-09

## 2016-10-09 MED ADMIN — sodium chloride 0.9 % (flush) injection syringe: @ 06:00:00

## 2016-10-09 NOTE — Nurses Notes (Signed)
Dr. Judie PetitM. Sabbagh called for admission orders. Orders received and clarified that zosyn and vancomycin were for pneumonia. Dr. Dorna LeitzM. Sabbagh also notified that pt. C\o chest and abd pain 10\10. Received order for oral pain medication Norco 5/325. Advised there is a interaction warning from where the pt. Is allergic to morphine, Dr. Judie PetitM. Sabbagh instructed to ask the patient if he has taken Norco before and if so to order, and if not, to give tylenol. Pt. Placed on contact precautions due to previous Hx of C-diff and current complaints of diarrhea.

## 2016-10-09 NOTE — Care Plan (Signed)
Problem: Patient Care Overview (Adult,OB)  Goal: Plan of Care Review(Adult,OB)  The patient and/or their representative will communicate an understanding of their plan of care   Outcome: Ongoing (see interventions/notes)    Goal: Individualization/Patient Specific Goal(Adult/OB)  Outcome: Ongoing (see interventions/notes)    Goal: Interdisciplinary Rounds/Family Conf  Outcome: Ongoing (see interventions/notes)      Problem: Diarrhea (Adult)  Goal: Identify Related Risk Factors and Signs and Symptoms  Related risk factors and signs and symptoms are identified upon initiation of Human Response Clinical Practice Guideline (CPG).   Outcome: Ongoing (see interventions/notes)    Goal: Improved/Reduced Symptoms  Patient will demonstrate the desired outcomes by discharge/transition of care.   Outcome: Ongoing (see interventions/notes)      Problem: Nausea/Vomiting (Adult)  Goal: Symptom Relief  Patient will demonstrate the desired outcomes by discharge/transition of care.   Outcome: Ongoing (see interventions/notes)    Goal: Adequate Hydration  Patient will demonstrate the desired outcomes by discharge/transition of care.   Outcome: Ongoing (see interventions/notes)

## 2016-10-09 NOTE — Pharmacy Vancomycin Dosing (Signed)
[facility]  Therapeutic Drug Monitoring: Vancomycin  10/09/2016      Patient name: [name],[name]  Date of Birth:  12-04-1970    Actual Weight:  Weight: 111.2 kg (245 lb 1 oz) (10/09/16 0128)     BMI:  BMI (Calculated): 32.4 (10/09/16 0128)    Date RPh Current regimen (including mg/kg) Indication/ Target Levels (mcg/mL) SCr (mg/dL) CrCl* (mL/min) Measured level (mcg/mL) Plan (including when levels are due)/Comments   06/23 WF RANDOM DOSING (1500 MG INITIAL DOSE) CAP  15-20 7.3 17  RANDOM LEVEL 18 HOURS POST 1ST DOSE                                                                 *Creatinine clearance is estimated by using the Cockcroft-Gault equation for adult patients and the Brendolyn PattySchwartz equation for pediatric patients.    The decision to discontinue vancomycin therapy will be determined by the primary service.  Please contact the pharmacist with any questions regarding this patient's medication regimen.

## 2016-10-09 NOTE — Nurses Notes (Signed)
Pt. Administered ordered dose of 2in nitro patch, dilaudid, and Zofran. VS taken after med administration and BP shows improvement. Pt. States the medication is starting to help.

## 2016-10-09 NOTE — Nurses Notes (Signed)
Pt. C\o chest and abdominal pain 10/10 and states more discomfort, appears agitated. EKG, vital signs obtained. Blood pressure remains elevated with manual cuff. Ordered dose of nitro patch applied. Offered pt. Home medication dose apresoline and ordered dose of Norco but refused both, states Norco will not do anything and he cannot swallow oral medications due to nausea. States during previous admission he was given dilaudid. Dr. Dorna LeitzM. Sabbagh called and paged, left voicemail requesting call back and notified of continuing chest pain, refusal of medications, [facility] and lab results of previous admission which ended 10/08/16. Dr. Darylene PriceA. Sabbagh (cardiologist) contacted via phone call due to concern for chest pain. Orders received for 0.5 dilaudid and nitro paste 2in.

## 2016-10-09 NOTE — Consults (Signed)
Pt resting in bed, not sob  No wheezes  Some edema  Labs-Results for Laqueta LindenMORGAN, Dequarius D (MRN [id number]) as of 10/09/2016 11:11   Ref. Range 10/09/2016 07:00   SODIUM Latest Ref Range: 135 - 145 mmol/L 138   POTASSIUM Latest Ref Range: 3.5 - 5.0 mmol/L 4.5   CHLORIDE Latest Ref Range: 98 - 111 mmol/L 103   CARBON DIOXIDE Latest Ref Range: 21 - 35 mmol/L 26   BUN Latest Ref Range: 10 - 25 mg/dL 28 (H)   CREATININE Latest Ref Range: <=1.30 mg/dL 5.405.88 (H)   GLUCOSE Latest Ref Range: 70 - 110 mg/dL 79   A-esrd, c diff, edema, liver disease  p-extra hd for UF today.

## 2016-10-09 NOTE — Nurses Notes (Signed)
Pt refusing all PO BP meds and nitroglycerin and sub q heparin, bp 162/104 brachial, demanding higher pain medication, chest pain 10/10 per patient statement after 0.5 mg dilaudid IV, educated pt on Plan of care, pt still refusing, visibly irritated. Notified Dr. Dorna LeitzM. Sabbagh via telephone.

## 2016-10-09 NOTE — Nurses Notes (Signed)
Patient ordered one unit of RBCs to be given during Dialysis. Permit signed. Blood received from Blood Bank. Patient does not have a blood band on arm. Patient cut it off last night when he left AMA. Called Blood Bank and blood returned. They said he would have to be typed and crossed matched again. Blood drawn from Dialysis lines and patient banded.

## 2016-10-09 NOTE — Care Plan (Signed)
Problem: Patient Care Overview (Adult,OB)  Goal: Plan of Care Review(Adult,OB)  The patient and/or their representative will communicate an understanding of their plan of care   Outcome: Ongoing (see interventions/notes)    Goal: Individualization/Patient Specific Goal(Adult/OB)  Outcome: Ongoing (see interventions/notes)    Goal: Interdisciplinary Rounds/Family Conf  Outcome: Ongoing (see interventions/notes)      Problem: Fall Risk (Adult)  Goal: Identify Related Risk Factors and Signs and Symptoms  Related risk factors and signs and symptoms are identified upon initiation of Human Response Clinical Practice Guideline (CPG).   Outcome: Completed Date Met: 10/09/16    Goal: Absence of Falls  Patient will demonstrate the desired outcomes by discharge/transition of care.   Outcome: Ongoing (see interventions/notes)      Problem: Diarrhea (Adult)  Goal: Identify Related Risk Factors and Signs and Symptoms  Related risk factors and signs and symptoms are identified upon initiation of Human Response Clinical Practice Guideline (CPG).   Outcome: Completed Date Met: 10/09/16    Goal: Improved/Reduced Symptoms  Patient will demonstrate the desired outcomes by discharge/transition of care.   Outcome: Ongoing (see interventions/notes)      Problem: Kidney Disease, Chronic/End Stage Renal Disease (Adult)  Prevent and manage potential problems including:  1. acid-base imbalance  2. cardiovascular disease  3. chronic pain  4. drug/nephrotoxicity  5. electrolyte imbalance  6. fluid imbalance  7. functional decline/self-care deficit  8. hematologic complications  9. hypertension  10. infection  11. pulmonary edema  12. renal osteodystrophy  13. situational response  14. undernutrition  15. uremic syndrome  16. vascular access complications   Goal: Signs and Symptoms of Listed Potential Problems Will be Absent, Minimized or Managed (Kidney Disease, Chronic/End Stage Renal Disease)  Signs and symptoms of listed potential problems  will be absent, minimized or managed by discharge/transition of care (reference Kidney Disease, Chronic/End Stage Renal Disease (Adult) CPG).   Outcome: Ongoing (see interventions/notes)      Problem: Cardiac: ACS (Acute Coronary Syndrome) (Adult)  Prevent and manage potential problems including:  1. cardiovascular structural defects  2. chest pain (angina)  3. dysrhythmia/arrhythmia  4. embolism  5. heart failure/shock  6. ischemia leading to infarction  7. pericarditis  8. situational response   Goal: Signs and Symptoms of Listed Potential Problems Will be Absent, Minimized or Managed (Cardiac: ACS)  Signs and symptoms of listed potential problems will be absent, minimized or managed by discharge/transition of care (reference Cardiac: ACS (Acute Coronary Syndrome) (Adult) CPG).   Outcome: Ongoing (see interventions/notes)      Problem: Nausea/Vomiting (Adult)  Goal: Identify Related Risk Factors and Signs and Symptoms  Related risk factors and signs and symptoms are identified upon initiation of Human Response Clinical Practice Guideline (CPG).   Outcome: Completed Date Met: 10/09/16    Goal: Symptom Relief  Patient will demonstrate the desired outcomes by discharge/transition of care.   Outcome: Ongoing (see interventions/notes)    Goal: Adequate Hydration  Patient will demonstrate the desired outcomes by discharge/transition of care.   Outcome: Ongoing (see interventions/notes)

## 2016-10-09 NOTE — Nurses Notes (Signed)
Pt left AMA, IV out, incident report completed, explained AMA process/papers with patient, discussed alternatives and need for blood transfusion and cardiac workup, pt still refusing, left by foot. Dr. Dorna LeitzM Sabbagh aware

## 2016-10-10 ENCOUNTER — Observation Stay (HOSPITAL_COMMUNITY): Payer: Self-pay | Admitting: Internal Medicine

## 2016-10-10 NOTE — Discharge Summary (Signed)
PATIENT NAME: Laqueta LindenMORGAN, Gabe D  HOSPITAL NUMBER:  [id number]  ADMIT DATE:  10/09/2016  DISCHARGE DATE:  10/09/2016  DATE OF BIRTH:  1971-01-07    DISCHARGE SUMMARY      NOTE:  Against Medical Advice Discharge.    DIAGNOSES:  Please see the dictated H and P for full diagnoses.    HOSPITAL COURSE:  The patient was admitted.  The patient did have dialysis.  The patient stated he still has abdominal pain.  The patient refused to have nitroglycerin paste for the chest pain. The patient was offered nitroglycerin drip but the patient refused.  He got angry and left AMA.  The patient was recommended to stay but he would not stay.    PHYSICAL EXAMINATION:  Vitals on date of discharge, temperature 37 degrees Celsius, pulse 94, respiratory rate 24, blood pressure 162/104.  It was as high as 184/100 and 193/118.  For the rest of his physical examination, please see H and P.     The patient was recommended to stay but left AMA.  The patient also refused blood transfusions.  He would not stay and left AMA.        Genia HaroldMohamed A Sabbagh, MD            DD:  10/09/2016 20:02:57  DT:  10/10/2016 14:59:38 ES  D#:  [id number]

## 2016-10-10 NOTE — H&P (Signed)
PATIENT NAME: KERI, TAVELLA  HOSPITAL NUMBER:  [id number]  DATE OF SERVICE: 10/09/2016  DATE OF BIRTH:  08/05/1970    HISTORY AND PHYSICAL    CHIEF COMPLAINT:  Chest pain.    HISTORY OF PRESENT ILLNESS:  The patient is a 46 year old male who comes in with chest pain, pressure-like sensation in the substernal area.  The patient stated he has been having pain now for a while.  The patient is also having abdominal pain and having nausea and vomiting.  The patient is having diarrhea.  The patient is having shortness of breath.  The patient does have history of hypertension.  He does have does have a history of end stage renal disease.  He comes in for further evaluation,  The patient was recently in the hospital 2 days ago for similar complaints.  The patient left AMA.  The patient then went to [facility] and was found to have anemia.  Hemoglobin is 7.9.  The patient also  had nausea, vomiting, and diarrhea.  The patient had a chest x-ray done that showed a pneumonia.  The patient was transferred here for dialysis.  The patient apparently is getting dialysis when seen.  The patient stated he is having severe chest pain, having shortness of breath.  Symptoms were exacerbated by nothing, relieved by nothing.  The patient stated he was having left arm pain.  The patient's symptoms got worse so he decided to come in.  The patient does have history of coronary artery disease.  The patient was started on nitroglycerin paste.  The patient is now refusing nitroglycerin paste.  The patient also has history of ascites.    PAST MEDICAL HISTORY:  History of asthma, Clostridium difficile  back in May, end stage renal disease, hypertension, coronary artery disease.    PAST SURGICAL HISTORY:  The patient has a fistula placed, cardiac stents placed, and hernia repair.    ALLERGIES:  1. LISINOPRIL.  2. MORPHINE.   3. TORADOL.    SOCIAL HISTORY:  The patient is a current every day smoker. He denies any alcohol  or illicit drug use.    FAMILY HISTORY:  Hypertension and coronary artery disease.    MEDICATIONS:  1. Renagel.   2. Hydralazine.   3. Lasix.  4. Coreg.   5. Albuterol.    The patient is also supposed to take aspirin but refuses to take.    PHYSICAL EXAMINATION:  General:  No acute distress.  Vital signs: Temperature is 36.6 degrees Celsius, pulse 93, blood pressure 184/100, respiratory rate 24, FiO2 95%.  The patient is refusing to take his blood pressure medication.  Head:  Normocephalic, atraumatic.  Eyes: PERRLA.  EOM intact.  Neck:  Supple with no lymphadenopathy.  Heart:  Regular rate and rhythm. Lungs:  Clear to auscultation bilaterally.  Abdomen:  Soft, nontender, nondistended.  Positive bowel sounds.  Extremities:  No edema.  Skin:  Warm and dry.  Neurological:  Cranial nerves II to XII intact.  Alert and oriented x3.  No lymphadenopathy.    LABORATORY AND X-RAY DATA:  White blood cells 4700, hemoglobin 10.10, hematocrit 22.9, platelets 101,000.  Troponin 0.06.  White blood cells 4600.  Sodium 138, potassium 4.5, chloride 103, bicarb 26, BUN 28, creatinine 5.88.    ASSESSMENT AND PLAN:  1. Questionable non-ST-elevation myocardial infarction.  We will start nitroglycerin paste.  The patient was also started on Dilaudid.  We will continue to monitor the patient.  Consult Dr. Betti Cruz.  2. Hypertension. Continue medication.  The patient was started on nitroglycerin paste.   3. Gastroesophageal reflux disease.  Will start Protonix.   4. End-stage renal disease.  The patient is for dialysis.  Continue to monitor the patient.   5. Anemia.  Most likely this is due to chronic kidney disease.  We will transfuse 1 unit of blood.   6. Chronic obstructive pulmonary disease.  Continue medication.   7. Deep venous thrombosis prophylaxis. Start heparin.   8. Clostridium difficile diarrhea.  We will start Flagyl and vancomycin.    9. The patient was also found to have pneumonia.  He was given a dose of vancomycin and Zosyn.    10. Nausea and vomiting.  Start Zofran and Phenergan.  Will continue to monitor the patient.  11. Diabetes.  Start sliding scale insulin.        Genia HaroldMohamed A Sabbagh, MD                DD:  10/09/2016 20:01:14  DT:  10/10/2016 10:20:46 ES  D#:  [id number]

## 2016-10-10 NOTE — ED Attending Note (Signed)
I was physically present and directly supervised this patients care. Patient seen and examined with the resident, Dr. Mina MarbleWeingold, and history and exam reviewed. Key elements in addition to and/or correction of that documentation are as follows:  Patient is a 46 y.o.  male presenting to the ED with chief complaint of chest pain., abdominal pain. 46 year old male with past medical history significant for ESRD, CAD with stents presents to the emergency department stating he is unable to arrange for his regular dialysis session as he is visiting from out of town, and has had onset of diffuse chest and abdominal pain. Patient states that he is not currently make urine and has no hope for renal recovery.      ROS: Otherwise negative, if commented on in the HPI.   Filed Vitals:    10/05/16 1834 10/05/16 1836 10/05/16 2039 10/05/16 2242   BP: (!) 144/94   (!) 151/86   Pulse: 90  86 90   Resp: 19  20    Temp:  36.6 C (97.9 F)     SpO2:   100% 100%       PMH, PSH, medications, allergies, SH, and FH per resident note. Important aspects of these fields pertaining to today's visit taken into consideration during history/physical and MDM.    Physical Exam:     Please see primary note for physical exam findings. Additional findings of my own are documented below.         MDM:   During the patient's stay in the emergency department, images and/or labs were performed to assist with medical decision making and were reviewed by myself.  Patient seen and examined with the assistance of Dr. Mina MarbleWeingold. Agree with above.  On examination, patient is awake, alert, and appears chronically ill.  He is able to converse regarding multiple topics without evidence of confusion or altered mental status.  His abdomen is distended, diffusely tender without evidence of rebound or guarding. Given the presence of chest and abdominal pain, the differential diagnosis included aortic dissection as well as the possibility of spontaneous bacterial  peritonitis in the setting of known ascites, acute coronary syndrome the presence of his known coronary artery disease with stent placed the past.  EKG at time of arrival is without evidence of acute current of injury is initial troponin is indeterminate in the setting of chronic kidney disease.  Given the fact the patient does not have foot for renal recovery does not currently make urine, CT of the chest abdomen pelvis with angiographic images was obtained to evaluate for the possibility of aortic dissection.  CTA returns without evidence of a dissection, but does reveal diffuse ascites.  Patient to be dialyzed during this admission, however, does not have evidence of volume overload, acidosis, or electrolyte derangement to necessitate emergent dialysis.  Impression:  1. chest pain  2.  ESRD    Chart completed after conclusion of patient care due to time constraints of direct patient care during shift.  Chart was dictated using voice recognition software, which may lead to minor grammatical or syntax errors.

## 2016-10-11 LAB — ECG 12-LEAD
Atrial Rate: 98 {beats}/min
Calculated P Axis: 58 degrees
Calculated T Axis: 80 degrees
PR Interval: 196 ms
PR Interval: 196 ms
QRS Duration: 88 ms
QT Interval: 390 ms

## 2016-10-11 LAB — ROUTINE STOOL CULTURE (INCLUDING E. COLI SHIGA TOXIN): STOOL CULTURE: NO GROWTH

## 2016-10-11 LAB — OVA AND PARASITE SCREEN: OVA & PARASITE SCREEN TRICHROME: NONE SEEN

## 2016-10-12 LAB — ADULT ROUTINE BLOOD CULTURE, SET OF 2 BOTTLES (BACTERIA AND YEAST)
BLOOD CULTURE, ROUTINE: NO GROWTH
BLOOD CULTURE, ROUTINE: NO GROWTH

## 2016-10-13 LAB — BPAM PACKED CELL ORDER: UNIT DIVISION: 0

## 2016-10-13 LAB — TYPE AND CROSS RED CELLS - UNITS
ABO/RH(D): O POS
ANTIBODY SCREEN: NEGATIVE
UNITS ORDERED: 1

## 2016-10-15 ENCOUNTER — Emergency Department (HOSPITAL_COMMUNITY): Payer: Self-pay | Admitting: Emergency Medicine

## 2016-10-22 ENCOUNTER — Inpatient Hospital Stay
Admit: 2016-10-22 | Discharge: 2016-10-23 | Discharge status: Against Medical Advice | Payer: MEDICARE | Attending: Internal Medicine | Admitting: Internal Medicine

## 2016-10-22 ENCOUNTER — Emergency Department: Admit: 2016-10-22 | Payer: MEDICARE

## 2016-10-22 ENCOUNTER — Inpatient Hospital Stay

## 2016-10-22 DIAGNOSIS — E8770 Fluid overload, unspecified: Secondary | ICD-10-CM

## 2016-10-22 LAB — EKG, 12 LEAD, INITIAL
Atrial Rate: 94 {beats}/min
Calculated P Axis: 55 degrees
Calculated R Axis: -10 degrees
Calculated T Axis: 89 degrees
Diagnosis: NORMAL
P-R Interval: 188 ms
Q-T Interval: 388 ms
QRS Duration: 82 ms
QTC Calculation (Bezet): 485 ms
Ventricular Rate: 94 {beats}/min

## 2016-10-22 LAB — METABOLIC PANEL, BASIC
Anion gap: 16 mmol/L — ABNORMAL HIGH (ref 5–15)
BUN: 81 mg/dl — ABNORMAL HIGH (ref 7–25)
CO2: 20 mEq/L — ABNORMAL LOW (ref 21–32)
Calcium: 6.1 mg/dl — ABNORMAL LOW (ref 8.5–10.1)
Chloride: 104 mEq/L (ref 98–107)
Creatinine: 11.8 mg/dl — ABNORMAL HIGH (ref 0.6–1.3)
GFR est AA: 6
GFR est non-AA: 5
Glucose: 100 mg/dl (ref 60–100)
Potassium: 5.7 mEq/L — ABNORMAL HIGH (ref 3.5–5.1)
Sodium: 140 mEq/L (ref 136–145)

## 2016-10-22 LAB — CBC WITH AUTOMATED DIFF
BASOPHILS: 0.6 % (ref 0–3)
EOSINOPHILS: 9 % — ABNORMAL HIGH (ref 0–5)
HCT: 25.3 % — ABNORMAL LOW (ref 37.0–50.0)
HGB: 7.9 gm/dl — ABNORMAL LOW (ref 12.4–17.2)
IMMATURE GRANULOCYTES: 0.2 % (ref 0.0–3.0)
LYMPHOCYTES: 8.3 % — ABNORMAL LOW (ref 28–48)
MCH: 30.7 pg (ref 23.0–34.6)
MCHC: 31.2 gm/dl (ref 30.0–36.0)
MCV: 98.4 fL — ABNORMAL HIGH (ref 80.0–98.0)
MONOCYTES: 7.9 % (ref 1–13)
MPV: 10.5 fL — ABNORMAL HIGH (ref 6.0–10.0)
NEUTROPHILS: 74 % — ABNORMAL HIGH (ref 34–64)
NRBC: 0 (ref 0–0)
PLATELET: 93 10*3/uL — ABNORMAL LOW (ref 140–450)
RBC: 2.57 M/uL — ABNORMAL LOW (ref 3.80–5.70)
RDW-SD: 61.1 — ABNORMAL HIGH (ref 35.1–43.9)
WBC: 5.3 10*3/uL (ref 4.0–11.0)

## 2016-10-22 LAB — HEP B SURFACE AB: Hepatitis B surface Ab: REACTIVE

## 2016-10-22 LAB — GLUCOSE, POC: Glucose (POC): 98 mg/dL (ref 65–105)

## 2016-10-22 LAB — PROTHROMBIN TIME + INR
INR: 1.2 — ABNORMAL HIGH (ref 0.1–1.1)
Prothrombin time: 13.4 seconds — ABNORMAL HIGH (ref 10.2–12.9)

## 2016-10-22 LAB — HEP B SURFACE AG: Hepatitis B surface Ag: NONREACTIVE

## 2016-10-22 LAB — HEPATIC FUNCTION PANEL
ALT (SGPT): 17 U/L (ref 12–78)
AST (SGOT): 29 U/L (ref 15–37)
Albumin: 3.1 gm/dl — ABNORMAL LOW (ref 3.4–5.0)
Alk. phosphatase: 257 U/L — ABNORMAL HIGH (ref 45–117)
Bilirubin, direct: 0.2 mg/dl (ref 0.0–0.2)
Bilirubin, total: 0.3 mg/dl (ref 0.2–1.0)
Protein, total: 6.6 gm/dl (ref 6.4–8.2)

## 2016-10-22 LAB — IRON PROFILE
Iron % saturation: 49 % — ABNORMAL HIGH (ref 20–45)
Iron: 116 ug/dL (ref 65–175)
TIBC: 236 ug/dL — ABNORMAL LOW (ref 250–450)

## 2016-10-22 LAB — TROPONIN I
Troponin-I: 0.052 ng/ml — ABNORMAL HIGH (ref 0.000–0.045)
Troponin-I: 0.071 ng/ml — ABNORMAL HIGH (ref 0.000–0.045)

## 2016-10-22 LAB — PTT: aPTT: 36.2 seconds (ref 25.1–36.5)

## 2016-10-22 LAB — NT-PRO BNP: NT pro-BNP: 122334 pg/ml — ABNORMAL HIGH (ref 0.0–125.0)

## 2016-10-22 LAB — LD: LD: 435 U/L — ABNORMAL HIGH (ref 87–241)

## 2016-10-22 LAB — HEPATITIS B SURFACE ANTIBODY: Hep B S Ab: REACTIVE

## 2016-10-22 LAB — EKG 12-LEAD
Atrial Rate: 94 {beats}/min
Diagnosis: NORMAL
P Axis: 55 degrees
P-R Interval: 188 ms
Q-T Interval: 388 ms
QRS Duration: 82 ms
QTc Calculation (Bazett): 485 ms
R Axis: -10 degrees
T Axis: 89 degrees
Ventricular Rate: 94 {beats}/min

## 2016-10-22 MED ORDER — ASPIRIN 81 MG CHEWABLE TAB
81 mg | ORAL | Status: AC
Start: 2016-10-22 — End: 2016-10-22
  Administered 2016-10-22: 13:00:00 via ORAL

## 2016-10-22 MED ORDER — ACETAMINOPHEN 325 MG TABLET
325 mg | ORAL | Status: DC | PRN
Start: 2016-10-22 — End: 2016-10-23

## 2016-10-22 MED ORDER — HYDROMORPHONE (PF) 1 MG/ML IJ SOLN
1 mg/mL | Freq: Once | INTRAMUSCULAR | Status: AC
Start: 2016-10-22 — End: 2016-10-22
  Administered 2016-10-22: 15:00:00 via INTRAVENOUS

## 2016-10-22 MED ORDER — ONDANSETRON (PF) 4 MG/2 ML INJECTION
4 mg/2 mL | Freq: Once | INTRAMUSCULAR | Status: AC
Start: 2016-10-22 — End: 2016-10-22
  Administered 2016-10-22: 13:00:00 via INTRAVENOUS

## 2016-10-22 MED ORDER — AMLODIPINE 5 MG TAB
5 mg | Freq: Every day | ORAL | Status: DC
Start: 2016-10-22 — End: 2016-10-23

## 2016-10-22 MED ORDER — ASPIRIN 81 MG CHEWABLE TAB
81 mg | Freq: Every day | ORAL | Status: DC
Start: 2016-10-22 — End: 2016-10-23
  Administered 2016-10-23: 12:00:00 via ORAL

## 2016-10-22 MED ORDER — HYDROCODONE-ACETAMINOPHEN 5 MG-325 MG TAB
5-325 mg | ORAL | Status: DC | PRN
Start: 2016-10-22 — End: 2016-10-23

## 2016-10-22 MED ORDER — ACETAMINOPHEN 650 MG RECTAL SUPPOSITORY
650 mg | RECTAL | Status: DC | PRN
Start: 2016-10-22 — End: 2016-10-23

## 2016-10-22 MED ORDER — METRONIDAZOLE 250 MG TAB
250 mg | Freq: Three times a day (TID) | ORAL | Status: DC
Start: 2016-10-22 — End: 2016-10-22
  Administered 2016-10-22: 15:00:00 via ORAL

## 2016-10-22 MED ORDER — NALOXONE 0.4 MG/ML INJECTION
0.4 mg/mL | INTRAMUSCULAR | Status: DC | PRN
Start: 2016-10-22 — End: 2016-10-23

## 2016-10-22 MED ORDER — ALBUTEROL SULFATE 0.083 % (0.83 MG/ML) SOLN FOR INHALATION
2.5 mg /3 mL (0.083 %) | RESPIRATORY_TRACT | Status: DC | PRN
Start: 2016-10-22 — End: 2016-10-23

## 2016-10-22 MED ORDER — CARVEDILOL 25 MG TAB
25 mg | Freq: Two times a day (BID) | ORAL | Status: DC
Start: 2016-10-22 — End: 2016-10-23
  Administered 2016-10-22 – 2016-10-23 (×2): via ORAL

## 2016-10-22 MED ORDER — SEVELAMER CARBONATE 800 MG TAB
800 mg | Freq: Three times a day (TID) | ORAL | Status: DC
Start: 2016-10-22 — End: 2016-10-23
  Administered 2016-10-22 – 2016-10-23 (×3): via ORAL

## 2016-10-22 MED ORDER — HEPARIN (PORCINE) 5,000 UNIT/ML IJ SOLN
5000 unit/mL | Freq: Three times a day (TID) | INTRAMUSCULAR | Status: DC
Start: 2016-10-22 — End: 2016-10-23

## 2016-10-22 MED ORDER — HYDROMORPHONE (PF) 1 MG/ML IJ SOLN
1 mg/mL | INTRAMUSCULAR | Status: DC | PRN
Start: 2016-10-22 — End: 2016-10-22

## 2016-10-22 MED ORDER — ONDANSETRON (PF) 4 MG/2 ML INJECTION
4 mg/2 mL | INTRAMUSCULAR | Status: DC | PRN
Start: 2016-10-22 — End: 2016-10-23

## 2016-10-22 MED ORDER — DIPHENHYDRAMINE 25 MG CAP
25 mg | ORAL | Status: DC | PRN
Start: 2016-10-22 — End: 2016-10-23

## 2016-10-22 MED ORDER — HYDROMORPHONE (PF) 1 MG/ML IJ SOLN
1 mg/mL | INTRAMUSCULAR | Status: DC | PRN
Start: 2016-10-22 — End: 2016-10-23
  Administered 2016-10-22 – 2016-10-23 (×6): via INTRAVENOUS

## 2016-10-22 MED ORDER — HYDRALAZINE 50 MG TAB
50 mg | Freq: Three times a day (TID) | ORAL | Status: DC
Start: 2016-10-22 — End: 2016-10-23
  Administered 2016-10-22 – 2016-10-23 (×4): via ORAL

## 2016-10-22 MED ORDER — MORPHINE 2 MG/ML INJECTION
2 mg/mL | INTRAMUSCULAR | Status: DC
Start: 2016-10-22 — End: 2016-10-22
  Administered 2016-10-22: 15:00:00 via INTRAVENOUS

## 2016-10-22 MED ORDER — ACETAMINOPHEN (TYLENOL) SOLUTION 32MG/ML
ORAL | Status: DC | PRN
Start: 2016-10-22 — End: 2016-10-23

## 2016-10-22 MED ORDER — VANCOMYCIN ORAL SOLUTION 100 MG/ML CPD
100 mg/ml | Freq: Four times a day (QID) | ORAL | Status: DC
Start: 2016-10-22 — End: 2016-10-23
  Administered 2016-10-22 – 2016-10-23 (×5): via ORAL

## 2016-10-22 MED FILL — HYDROMORPHONE (PF) 1 MG/ML IJ SOLN: 1 mg/mL | INTRAMUSCULAR | Qty: 1

## 2016-10-22 MED FILL — VANCOMYCIN ORAL SOLUTION 100 MG/ML CPD: 100 mg/ml | ORAL | Qty: 1.25

## 2016-10-22 MED FILL — ONDANSETRON (PF) 4 MG/2 ML INJECTION: 4 mg/2 mL | INTRAMUSCULAR | Qty: 2

## 2016-10-22 MED FILL — ASPIRIN 81 MG CHEWABLE TAB: 81 mg | ORAL | Qty: 2

## 2016-10-22 MED FILL — HYDRALAZINE 50 MG TAB: 50 mg | ORAL | Qty: 2

## 2016-10-22 MED FILL — HEPARIN (PORCINE) 5,000 UNIT/ML IJ SOLN: 5000 unit/mL | INTRAMUSCULAR | Qty: 1

## 2016-10-22 MED FILL — METRONIDAZOLE 250 MG TAB: 250 mg | ORAL | Qty: 2

## 2016-10-22 MED FILL — HYDROCODONE-ACETAMINOPHEN 5 MG-325 MG TAB: 5-325 mg | ORAL | Qty: 1

## 2016-10-22 MED FILL — SEVELAMER CARBONATE 800 MG TAB: 800 mg | ORAL | Qty: 1

## 2016-10-22 MED FILL — CARVEDILOL 25 MG TAB: 25 mg | ORAL | Qty: 1

## 2016-10-22 NOTE — ED Notes (Signed)
Pt room changed.  Called dialysis to give report on patient.  Also called [facility] for report after dialysis is finished, pt will go there.    TRANSFER - OUT REPORT:    Verbal report given to [place] Lake[place] on David [name]  being transferred to [facility] for routine progression of care       Report consisted of patient???s Situation, Background, Assessment and   Recommendations(SBAR).     Information from the following report(s) SBAR, ED Summary, [facility] and Recent Results was reviewed with the receiving nurse.    Lines:   Peripheral IV 10/22/16 Right Other(comment) (Active)   Site Assessment Clean, dry, & intact 10/22/2016  8:44 AM   Phlebitis Assessment 0 10/22/2016  8:44 AM   Infiltration Assessment 0 10/22/2016  8:44 AM   Dressing Status Clean, dry, & intact 10/22/2016  8:44 AM   Dressing Type Transparent 10/22/2016  8:44 AM   Hub Color/Line Status Flushed 10/22/2016  8:44 AM   Action Taken Blood drawn 10/22/2016  8:44 AM   Alcohol Cap Used Yes 10/22/2016  8:44 AM        Opportunity for questions and clarification was provided.      Patient transported with:   [employer]

## 2016-10-22 NOTE — Progress Notes (Signed)
2nd attempt to get 2D echo done--patient requested to come back tomorrow, just returned from dialysis.

## 2016-10-22 NOTE — ED Notes (Signed)
TRANSFER - OUT REPORT:    Verbal report given to Ampy on David [name]  being [facility] for routine progression of care       Report consisted of patient???s Situation, Background, Assessment and   Recommendations(SBAR).     Information from the following report(s) SBAR, ED Summary, [facility] and Recent Results was reviewed with the receiving nurse.    Lines:   Peripheral IV 10/22/16 Right Other(comment) (Active)   Site Assessment Clean, dry, & intact 10/22/2016  8:44 AM   Phlebitis Assessment 0 10/22/2016  8:44 AM   Infiltration Assessment 0 10/22/2016  8:44 AM   Dressing Status Clean, dry, & intact 10/22/2016  8:44 AM   Dressing Type Transparent 10/22/2016  8:44 AM   Hub Color/Line Status Flushed 10/22/2016  8:44 AM   Action Taken Blood drawn 10/22/2016  8:44 AM   Alcohol Cap Used Yes 10/22/2016  8:44 AM        Opportunity for questions and clarification was provided.      Patient transported with:   [employer]

## 2016-10-22 NOTE — Other (Signed)
Bedside and Verbal shift change report given to Carol P. Elizaga, RN   (oncoming nurse) by Kanika, RN (offgoing nurse). Report included the following information SBAR, Kardex and MAR.

## 2016-10-22 NOTE — ED Provider Notes (Signed)
[place]  Emergency Department Treatment Report    Patient: David [name] Age: 46 y.o. Sex: male    Date of Birth: 10/19/1970 Admit Date: 10/22/2016 PCP: None   MRN: [id number]  CSN: [id number]     Room: ER32/ER32 Time Dictated: 7:46 AM      I hereby certify this patient for admission based upon medical necessity as noted below:    Chief Complaint   Chief Complaint   Patient presents with   ??? Chest Pain   ??? 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 Patient presents today with progressive gradual onset SOB after missing dialysis two times in a row in the past week, then onset central chest pressure radiating into LEFT shoulder, last night 11PM while at rest that has been constant without aggravating or alleviating factors. He has had an MI several years ago now with 2 stents. He is not anticoagulated or on any antiplatelet medications because he bled too easily with dialysis. Pt states he has ascites and gets 5-8L off every week therapeutically.     Other concern today was onset Sunday of diffuse volume, loose, malodorous stools consistent with many previous episodes of Cdiff. Patient states he completed 2w of vanc and flagyl PO about 2w ago. He has seen ID specialist in the past, who recommended stool transplant but it is not offered in [place] where patient lives. He states he is here visiting a friend.    Review of Systems   Constitutional:  No fever, chills, or night sweats  Neurological: No headache, focal weakness, sensory changes  Eyes: No visual changes or eye pain  ENT: No cough, sore throat, ear pain or acute changes in hearing  Respiratory: No cough. Has shortness of breath  Cardiovascular: No palpitations. Has chest pressure  Gastrointestinal: Having nausea and diarrhea. Abdominal girth increasing with tightness sensation  Genitourinary: Anuric   Musculoskeletal: No acute joint pain, swelling, erythema  Integumentary: No rash or sores. Having abdominal discoloration since JAN with ascites  Denies complaints in all other systems    Past Medical/Surgical History     Past Medical History:   Diagnosis Date   ??? Adverse effect of anesthesia     sleep apnea uses oxygen at night    ??? Ascites    ??? Asthma    ??? C. difficile colitis    ??? CKD (chronic kidney disease) stage V requiring chronic dialysis ([place])    ??? HTN (hypertension)    ??? SBP (spontaneous bacterial peritonitis) ([place])    ??? Vomiting 08/20/2016     Past Surgical History:   Procedure Laterality Date   ??? COLONOSCOPY N/A 03/04/2016    COLONOSCOPY performed by Macie Burows, MD at [facility]   ??? HX HERNIA REPAIR  2008    right inguinal and umbilical   ??? SIGMOIDOSCOPY,DIAGNOSTIC  03/04/2016          Current Medications     (Not in a hospital admission)    Allergies     Allergies   Allergen Reactions   ??? Lisinopril Shortness of Breath   ??? Morphine Shortness of Breath   ??? Toradol [Ketorolac] Shortness of Breath       Social History     Social History     Social History   ??? Marital status: SINGLE     Spouse name:  N/A   ??? Number of children: N/A   ??? Years of education: N/A     Social History Main Topics   ??? Smoking status: Current Every Day Smoker     Packs/day: 0.25     Years: 10.00   ??? Smokeless tobacco: Never Used      Comment: 3 cigs a day   ??? Alcohol use No   ??? Drug use: No   ??? Sexual activity: Not on file     Other Topics Concern   ??? Not on file     Social History Narrative       Family History     Family History   Problem Relation Age of Onset   ??? Hypertension Mother        Physical Exam   ED Triage Vitals   Enc Vitals Group      BP 10/22/16 0721 155/100      Pulse (Heart Rate) 10/22/16 0721 95      Resp Rate 10/22/16 0721 22      Temp 10/22/16 0721 98.1 ??F (36.7 ??C)      Temp src --       O2 Sat (%) 10/22/16 0721 95 %      Weight 10/22/16 0721 255 lb      Height 10/22/16 0721 '6\' 1"'       Head Cir --        Peak Flow --       Pain Score --       Pain Loc --       Pain Edu? --       Excl. in [place]? --      Constituational: Patient is afebrile, Vital signs reviewed, Patient is in no distress.    Neuro: awake and alert. No gross facial asymmetry. CN III-XII grossly intact. Moves all extremities equally  Head: Atraumatic, normocephalic  Eyes: Normal inspection. No conjunctival injection or discharge.  No scleral icterus.  PERRL.    ENT:  No facial bruises, normal external ear and nose inspection.  Mucous membranes moist.   Neck:  Supple, normal inspection, symmetric without JVD or stridor  Cardiovascular:  regular rate and rhythm, heart sounds normal without murmurs.  Radial pulses palpable bilaterally. Fingers warm and well perfused with <2s cap refill. LUE with evident prior fistulas x2 currently only pulsatile without thrill. Left upper chest with TDC, no eo infection  Respiratory:  No respiratory distress, mildly tachypnic with exertion. Bilateral symmetric chest rise with respirations. Breath sounds present bilaterally with rales bilat bases  Abdomen: soft, markedly distended and fluid wave present, mildly tender "tightness" to palpation all quadrants without rebound, guarding or tenderness to percussion. BS present. Obese.   Back: Grossly symmetric. Non-tender. No ecchymosis.   Skin: color normal without jaundice, mottling or rash. Skin is warm and dry. Hyperpigmentation over inferior half of abdomen. Multiple well healed abdominal scars.  Musculoskeletal:  BUE and BLE long bones without deformity, 4+ pitting pedal edema bilat. Moves all extremties.  Psych: mood/affect appropriate. Conversational.    Initial Impression and Plan   Considered hypervol with 2 missed dialysis runs in past 1 week, SBP but exam more consistent with abdominal discomfort due to vol of ascites, anemia, hepatic injury/failure, uremia, infection/sepsis, CHF, ACS, electrolyte imbal, Cdiff reinfection, gastroenteritis, colitis, among  others. Labs and imaging. Will require admission following workup for additional dialysis, possibly therapeutic pericentesis.     Diagnostic Studies   Lab:   Recent Results (from the past  24 hour(s))   CBC WITH AUTOMATED DIFF    Collection Time: 10/22/16  8:19 AM   Result Value Ref Range    WBC 5.3 4.0 - 11.0 1000/mm3    RBC 2.57 (L) 3.80 - 5.70 M/uL    HGB 7.9 (L) 12.4 - 17.2 gm/dl    HCT 25.3 (L) 37.0 - 50.0 %    MCV 98.4 (H) 80.0 - 98.0 fL    MCH 30.7 23.0 - 34.6 pg    MCHC 31.2 30.0 - 36.0 gm/dl    PLATELET 93 (L) 140 - 450 1000/mm3    MPV 10.5 (H) 6.0 - 10.0 fL    RDW-SD 61.1 (H) 35.1 - 43.9      NRBC 0 0 - 0      IMMATURE GRANULOCYTES 0.2 0.0 - 3.0 %    NEUTROPHILS 74.0 (H) 34 - 64 %    LYMPHOCYTES 8.3 (L) 28 - 48 %    MONOCYTES 7.9 1 - 13 %    EOSINOPHILS 9.0 (H) 0 - 5 %    BASOPHILS 0.6 0 - 3 %   PROTHROMBIN TIME + INR    Collection Time: 10/22/16  8:19 AM   Result Value Ref Range    Prothrombin time 13.4 (H) 10.2 - 12.9 seconds    INR 1.2 (H) 0.1 - 1.1     PTT    Collection Time: 10/22/16  8:19 AM   Result Value Ref Range    aPTT 36.2 25.1 - 36.5 seconds   TROPONIN I    Collection Time: 10/22/16  8:19 AM   Result Value Ref Range    Troponin-I 0.052 (H) 0.000 - 5.784 ng/ml   METABOLIC PANEL, BASIC    Collection Time: 10/22/16  8:19 AM   Result Value Ref Range    Potassium 5.7 (H) 3.5 - 5.1 mEq/L    Chloride 104 98 - 107 mEq/L    Sodium 140 136 - 145 mEq/L    CO2 20 (L) 21 - 32 mEq/L    Glucose 100 60 - 100 mg/dl    BUN 81 (H) 7 - 25 mg/dl    Creatinine 11.8 (H) 0.6 - 1.3 mg/dl    GFR est AA 6.0      GFR est non-AA 5      Calcium 6.1 (L) 8.5 - 10.1 mg/dl    Anion gap 16 (H) 5 - 15 mmol/L   HEPATIC FUNCTION PANEL    Collection Time: 10/22/16  8:19 AM   Result Value Ref Range    AST (SGOT) 29 15 - 37 U/L    ALT (SGPT) 17 12 - 78 U/L    Alk. phosphatase 257 (H) 45 - 117 U/L    Bilirubin, total 0.3 0.2 - 1.0 mg/dl    Protein, total 6.6 6.4 - 8.2 gm/dl    Albumin 3.1 (L) 3.4 - 5.0 gm/dl     Bilirubin, direct 0.2 0.0 - 0.2 mg/dl   NT-PRO BNP    Collection Time: 10/22/16  8:19 AM   Result Value Ref Range    NT pro-BNP 122334.0 (H) 0.0 - 125.0 pg/ml       Imaging:    XR CHEST PA LAT   Final Result   IMPRESSION: Cardiomegaly with minimal atelectatic changes in the lung bases.   Other findings as described above.           Xr Chest Pa Lat    Result Date: 10/22/2016  AP and lateral chest. INDICATION: Chest  pain. FINDINGS: Cardiomegaly. Left-sided Vas-Cath in place. Right sided stent overlying the right clavicular region. Minimal atelectatic changes in the lung bases. No effusions. Bony structures intact.     IMPRESSION: Cardiomegaly with minimal atelectatic changes in the lung bases. Other findings as described above.     EKG:  Read and interpreted by me, Normal sinus rhythm without any acute ischemic changes, no peaked T.    ED Course/Medical Decision Making   MOST RECENT VITALS   Visit Vitals   ??? BP (!) 174/117   ??? Pulse 91   ??? Temp 98.1 ??F (36.7 ??C)   ??? Resp 19   ??? Ht '6\' 1"'  (1.854 m)   ??? Wt 115.7 kg (255 lb)   ??? SpO2 99%   ??? BMI 33.64 kg/m2     H/H 8/25, improved from 28moago. PLT stable, but low. INR stable, elevated and consistent with hx cirrhosis. AP stable from previous, also consistent with cirrhosis. BNP 122,000 consistent with hypervolemia missed dialysis x2 recently. Trop elevated and will require trending. Has been higher in past when ACS ruled out, consistent with ESRD. K 5.7 will need dialysis. EKG NSR without peaked T.    10:45 AM Recheck. Pt requesting something for the stomach pains. States this is the same pain he always has with Cdiff and morphine works. Pt nauseated, so will avoid PO. Dosing morphine. Discussed results with patient and advised I recommend admission. He agrees with this plan. All questions answered. Cdiff labs still pending, will restart last ID regimen of Vanc and Flagyl. Page BOliver Pilafor admission.    11:06 AM Discussed with Dr. COrvan Julyagrees to admit. Made aware needs  dialysis, trop trending and cdiff results f/u. I gave pain meds and started vanc/flagyl PO. Agrees to admit. Appreciate assistance in this patient's care.     Final Diagnosis       ICD-10-CM ICD-9-CM   1. ESRD needing dialysis (HCC) N18.6 585.6    Z99.2    2. Abdominal distention R14.0 787.3   3. Diarrhea, infectious, adult A09 009.2   4. Non-intractable vomiting with nausea, unspecified vomiting type R11.2 787.01   5. Chest pain, unspecified type R07.9 786.50       Disposition   Admit  The patient was personally evaluated by myself and Dr. SSonny Masterswho agrees with the above assessment and plan.      C. TOtilio SaberEMid [facility] October 22, 2016      My signature above authenticates this document and my orders, the final ??  diagnosis(es), discharge prescription(s), and instructions in the Epic ??  Record. If you have any questions please contact [phone]  ??  Nursing notes have been reviewed by the physician/ advanced practice ??  Clinician.

## 2016-10-22 NOTE — H&P (Signed)
History & Physical    10/22/2016  Primary Care Provider: None  Source of Information: Patient, Records in EHR    Chief complaint: chest pain, Abdominal pain, Diarrhea, SOB        Assessment:    Active Problems:    Ascites (03/04/2016)      ESRD (end stage renal disease) on dialysis ([place]) (03/04/2016)      Volume overload (06/16/2016)      Clostridium difficile diarrhea (06/27/2016)      Hyperkalemia, diminished renal excretion (10/22/2016)      Other chest pain (10/22/2016)      Generalized abdominal pain (10/22/2016)        Impression:   Is a 46 year old Afro-American male with past medical history of end-stage renal disease, obstructive sleep apnea/asthma, ascites, chronic respiratory failure with hypoxia on 3-4 L nasal cannula, and was sounds like recurrent C. Difficile.  Outside records and history of noncompliance, drug-seeking behavior, and multiple AMA discharges within the [facility] system. It sounds like the biggest aspect of issues currently is related to noncompliance with hemodialysis probably has some role in his shortness of breath, chest pain, abdominal pain, and ascites.  Recurrent C. difficile may be a separate issue.        Plan:    1. Chest pain: Likely due to volume overload and noncompliance with hemodialysis.  Outside records report the patient has history of noncompliance with dialysis.  We'll check echo here.  Trend troponin.  Telemetry.  Aspirin.  Monitor for improvement after dialysis.  Note negative stress testing in 02/2016. Discharge summary from February of this year from outside facility notes chronic recurrent noncardiac chest pain with chronically elevated troponin as well as noncompliance with dialysis at that time.    2. ESRD on HD: Consult Nephrology here.  Continue Renvela. Outside records that the patient does not have outpatient HD chair due to long history of nonadherence and was fired by [facility] hemodialysis center. The  patient reports he currently dialyzes at [facility].  Suspect much of chest pain, and ascites is due to this issue.    3. 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 Outside imaging does not specifically describe cirrhosis. LFTs appear to be appropriate at this time. However the abdomen is distended and tight. Will ask for ultrasound-guided paracentesis with body fluid studies including cultures, LDH, albumin, cell count and differential. Monitor CMP.    4. 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 In and that fecal transplant is been suggested in the past. We'll plan to continue oral vancomycin and Flagyl.  Consult ID here.  F/up c.diff assay and stool studies.  Consider GI consult depending on ID recs.  C.diff assays have been positive in the past.    5. Hyperkalemia: Mild-Nephrology consult for HD.    6. Chronic hypoxic respiratory failure: Continue oxygen at 3-4 L by nasal cannula.  Albuterol prn.    7. Obstructive sleep apnea: Mouth CPAP at night.    8. Nausea and vomiting: Zofran when necessary.  Being clear liquid diet after paracentesis.    9. Hx of non-compliance/drug seeking behavior: Will offer hydromorphone and Norco when necessary for pain, but would avoid increasing doses without significant change exam or imaging findings.  Per multiple old discharge summaries, pain was often not controlled at 1 mg every 4 hours of hydromorphone IV.    10. Anemia of CKD: Monitor CBC.  Reports hx of multiple transfusions  required in the past.    PPX: Heparin TID.    Code status: FULL.      History of Presenting Illness:  David [name] is a 46 y.o. AA male with past medical history of end-stage renal disease on hemodialysis, reportedly follows with [facility], recurrent C. difficile, asthma, obstructive sleep apnea although not on CPAP, chronic hypoxic respiratory failure on 3-4 L of oxygen, ascites, and history of  noncompliance/multiple AMA discharges with reported drug seeking behavior on outside records.  He presents today with a chief complaint of diarrhea which began worsening on Sunday. The patient reports has foul odor and yellow color with abdominal pain which she reports is similar to episodes of C. difficile he's had in the past.  He reports he's had 14 episodes of C. difficile has been considered for fecal transplant but that this is not able to be arranged through his main hospital in [place].  He reports that he finished a course of vancomycin and Flagyl approximately 2 weeks ago.  He also reports chest pain shortness of breath which began last night.  Chest pain is left-sided, radiating down the left arm.  He had a negative stress test in November 2017.  He reports he last one dialysis approximately one week ago, and did not dialyze on Monday or Wednesday of this week due to diarrhea.  In the emergency department he's been started on oral vancomycin and Flagyl. BNP is elevated at 122,334.  Initial troponin is 0.052.  Potassium 5.7.  He reports that he typically has ascites drained once every week, but that is been approximately 1-1/2 weeks. He reports that liver diseases he thinks was caused by multiple transfusions due to chronic anemia.  He will ports history of nonalcoholic cirrhosis, although I don't see cirrhosis specifically described on prior imaging results in Epic.  He also reports nausea with poor tolerance of by mouth.  He denies fever.      PMHX:  Past Medical History:   Diagnosis Date   ??? Adverse effect of anesthesia     sleep apnea uses oxygen at night    ??? Ascites    ??? Asthma    ??? C. difficile colitis    ??? Chronic respiratory failure with hypoxia ([place])     on 3-4L n/c at home.   ??? ESRD (end stage renal disease) on dialysis [facility])    ??? HTN (hypertension)    ??? Myocardial infarct [facility])    ??? OSA (obstructive sleep apnea)     reports does not have cpap    ??? Peritoneal dialysis catheter infection ([place])    ??? SBP (spontaneous bacterial peritonitis) ([place])    ??? Vomiting 08/20/2016        PSHX:  Past Surgical History:   Procedure Laterality Date   ??? COLONOSCOPY N/A 03/04/2016    COLONOSCOPY performed by Macie Burows, MD at [facility]   ??? HX HERNIA REPAIR  2008    right inguinal and umbilical   ??? HX VASCULAR ACCESS      Hx of one site on R, 2 on L, currently dialyzing through L Tunnel cath.   ??? SIGMOIDOSCOPY,DIAGNOSTIC  03/04/2016            MEDICATIONS:  Prior to Admission medications    Medication Sig Start Date End Date Taking? Authorizing Provider   sevelamer carbonate (RENVELA) 800 mg tab tab Take 800 mg by mouth three (3) times daily.   Yes Historical Provider   carvedilol (COREG)  25 mg tablet Take 25 mg by mouth two (2) times daily (with meals).   Yes Historical Provider   hydrALAZINE (APRESOLINE) 100 mg tablet Take 100 mg by mouth three (3) times daily.   Yes Phys Other, MD   amLODIPine (NORVASC) 10 mg tablet Take 10 mg by mouth daily.   Yes Phys Other, MD   furosemide (LASIX) 80 mg tablet Take 80 mg by mouth daily.   Yes Phys Other, MD   albuterol (PROVENTIL HFA, VENTOLIN HFA, PROAIR HFA) 90 mcg/actuation inhaler Take 2 Puffs by inhalation every four (4) hours as needed for Wheezing.   Yes Phys Other, MD       ALLERGIES:  Allergies   Allergen Reactions   ??? Lisinopril Shortness of Breath   ??? Morphine Shortness of Breath   ??? Toradol [Ketorolac] Shortness of Breath       FHX:  Family History   Problem Relation Age of Onset   ??? Hypertension Mother    ??? Kidney Disease Neg Hx    ??? Liver Disease Neg Hx         SHX:  Social History     Social History   ??? Marital status: SINGLE     Spouse name: N/A   ??? Number of children: N/A   ??? Years of education: N/A     Occupational History   ??? Not on file.     Social History Main Topics   ??? Smoking status: Current Every Day Smoker     Packs/day: 0.25     Years: 10.00   ??? Smokeless tobacco: Never Used      Comment: 3 cigs a day    ??? Alcohol use No   ??? Drug use: No   ??? Sexual activity: Not on file     Other Topics Concern   ??? Not on file     Social History Narrative         Review of Systems:  General ROS: Negative for fevers, chills  HEENT ROS: Negative sore throat, trouble swallowing, Coryza   CV ROS: (+) chest pain with radiation, shortness of breath, leg swelling.  Pulm ROS: (+) SOB, cough  GI ROS: (+) for nausea/vomiting, recurrent diarrhea, Abdominal pain.  GU ROS: Reports anuric for ~6 months.  Musculoskeletal ROS: Negative for Back pain, joint pain  Lymph/HEME ROS: Negative for easy bruising, easy bleeding  Skin ROS: Negative for rash, jaundice.  Neuro ROS: negative for focal numbness, weakness, tingling    All other systems reviewed and negative.          Physical Exam:     Visit Vitals   ??? BP (!) 174/117   ??? Pulse 91   ??? Temp 98.1 ??F (36.7 ??C)   ??? Resp 19   ??? Ht '6\' 1"'  (1.854 m)   ??? Wt 115.7 kg (255 lb)   ??? SpO2 99%   ??? BMI 33.64 kg/m2      O2 Device: Room air    General: Alert, AAM, pleasant to conversation, appears mildly chronically ill and mildly uncomfortable.  HEENT:White sclera. PERRL, Oral Mucosa pink and moist.  Lymph: No palpable anterior cervical or supraclavicular lympadenopathy  CV: Regular rate and rhythm, no murmurs, rubs, gallops  Pulm: Lungs clear to auscultation bilaterally, no focal wheezes, crackles, rhonchi  GI: distended. Diffuse tenderness.  Numerous old scars to abdomen, with some anasarca and chronic skin discoloration present.  Extremity: No clubbing, cyanosis, Anasarca that extends to the Abdomen. +2 DP  pulses.  Skin: Warm, dry, Chronic skin darkening over the abdomen.  Neurologic: Grossly normal cranial nerve and peripheral motor function       24 Hour Results:  Recent Results (from the past 72 hour(s))   CBC WITH AUTOMATED DIFF    Collection Time: 10/22/16  8:19 AM   Result Value Ref Range    WBC 5.3 4.0 - 11.0 1000/mm3    RBC 2.57 (L) 3.80 - 5.70 M/uL    HGB 7.9 (L) 12.4 - 17.2 gm/dl     HCT 25.3 (L) 37.0 - 50.0 %    MCV 98.4 (H) 80.0 - 98.0 fL    MCH 30.7 23.0 - 34.6 pg    MCHC 31.2 30.0 - 36.0 gm/dl    PLATELET 93 (L) 140 - 450 1000/mm3    MPV 10.5 (H) 6.0 - 10.0 fL    RDW-SD 61.1 (H) 35.1 - 43.9      NRBC 0 0 - 0      IMMATURE GRANULOCYTES 0.2 0.0 - 3.0 %    NEUTROPHILS 74.0 (H) 34 - 64 %    LYMPHOCYTES 8.3 (L) 28 - 48 %    MONOCYTES 7.9 1 - 13 %    EOSINOPHILS 9.0 (H) 0 - 5 %    BASOPHILS 0.6 0 - 3 %   PROTHROMBIN TIME + INR    Collection Time: 10/22/16  8:19 AM   Result Value Ref Range    Prothrombin time 13.4 (H) 10.2 - 12.9 seconds    INR 1.2 (H) 0.1 - 1.1     PTT    Collection Time: 10/22/16  8:19 AM   Result Value Ref Range    aPTT 36.2 25.1 - 36.5 seconds   TROPONIN I    Collection Time: 10/22/16  8:19 AM   Result Value Ref Range    Troponin-I 0.052 (H) 0.000 - 8.676 ng/ml   METABOLIC PANEL, BASIC    Collection Time: 10/22/16  8:19 AM   Result Value Ref Range    Potassium 5.7 (H) 3.5 - 5.1 mEq/L    Chloride 104 98 - 107 mEq/L    Sodium 140 136 - 145 mEq/L    CO2 20 (L) 21 - 32 mEq/L    Glucose 100 60 - 100 mg/dl    BUN 81 (H) 7 - 25 mg/dl    Creatinine 11.8 (H) 0.6 - 1.3 mg/dl    GFR est AA 6.0      GFR est non-AA 5      Calcium 6.1 (L) 8.5 - 10.1 mg/dl    Anion gap 16 (H) 5 - 15 mmol/L   HEPATIC FUNCTION PANEL    Collection Time: 10/22/16  8:19 AM   Result Value Ref Range    AST (SGOT) 29 15 - 37 U/L    ALT (SGPT) 17 12 - 78 U/L    Alk. phosphatase 257 (H) 45 - 117 U/L    Bilirubin, total 0.3 0.2 - 1.0 mg/dl    Protein, total 6.6 6.4 - 8.2 gm/dl    Albumin 3.1 (L) 3.4 - 5.0 gm/dl    Bilirubin, direct 0.2 0.0 - 0.2 mg/dl   NT-PRO BNP    Collection Time: 10/22/16  8:19 AM   Result Value Ref Range    NT pro-BNP 122334.0 (H) 0.0 - 125.0 pg/ml       Imaging:   AP and lateral chest.  ??  INDICATION: Chest pain.  ??  FINDINGS: Cardiomegaly. Left-sided Vas-Cath in place.  Right sided stent  overlying the right clavicular region. Minimal atelectatic changes in the lung   bases. No effusions. Bony structures intact.  ??  IMPRESSION  IMPRESSION: Cardiomegaly with minimal atelectatic changes in the lung bases.  Other findings as described above.  ??      Dictation software was used in the creation of this document. Though reviewed, unintended errors may be present.    Signed By: Marzella Schlein, MD    October 22, 2016    12:26

## 2016-10-22 NOTE — ED Notes (Signed)
Provider at the bedside, pt was requesting pain medication, talking about it now with provider.

## 2016-10-22 NOTE — ED Notes (Signed)
Pt told provider that he is not allergic to morphine, told RN that he is.  Provider notified after morphine order placed, provider will change d/t unsure history.

## 2016-10-22 NOTE — ED Triage Notes (Signed)
Pt states he has been having chest pain since yesterday, also having shortness of breath with diarrhea, currently being treated for Cdiff

## 2016-10-22 NOTE — Other (Signed)
.  Acute HEmodialysis flow sheet   Patient information           Name:David [name]         MRN: [id number]      AVW:[id number]  TX# Room#5203/5203          [facility] DX: ascites, ESRD, volume overload, CDIFF          [] 1st Time Acute  [] Stat[x]  Routine [] Urgent [] Chronic Unit          [x] Acute Room [] Bedside  [] ICU/CCU [] ER         Isolation Precautions: [x] Dialysis[]  Airborne [] Contact [] Droplet [] Reverse          Special Considerations:    []  Blood Consent Verified  [x] N/A         Allergies:  []  NKA  [x]  Reviewed  Allergies   Allergen Reactions   ??? Lisinopril Shortness of Breath   ??? Morphine Shortness of Breath   ??? Toradol [Ketorolac] Shortness of Breath      Code Status [x] Full Code []  DNR  [] Other        Diet: see chart Diabetic: [x] Yes [] No      Hemodialysis Orders         Physician: Dr. Benedetto Goadakowski   Dialyzer Revaclear 300 Duration 3 BFR: 400 DFR: 600    Dialysate:  2 K  CA 2.5 Na not order  Bicarb 35        Dry Weight: UF Goal: 5000 ml         Heparin:  [] Bolus   Units    [] Hourly  Units      [x] None       BP Tx:  [] NS  21700ml/Bolus    [] Other     [x] N/A       Labs: Pre:  Post: [x] N/A             Additional Orders: [x] N/A          [x] Signed Treatment Consent   [x]  Verified   []  Obtained    Primary Nurse Report: First initial/ Last Name/Title        Pre Dialysis: J. Mellott, RN   Time: 1130    Access   CATHETER ACCESS:  []  N/A  []  RIGHT  [x]  LEFT  []  IJ  [x]  SUBCL []  FEM                          []  First use X-ray  [x]  Tunnel     []  Non-Tunneled                          [x]  No S/S infection  []  Redness []  Drainage  []  Cultured []  Swelling                         []  Pain                          [x]  Medical Aseptic [x]  Prep Dressing Changed                          []  Clotted []  Patent [x]       Flows: [x]  Good []  Poor []  Reversed             If Access Problem Dr. Truitt MerleNotified: []  Yes []  No  Date:    [x]  N/A        GRAFT/FISTULA ACCESS:  [x]  N/A  []  RIGHT  []  LEFT  []  UE  []  LE                            []  AVG  []  AVF []  BUTTONHOLE  []  +BRUIT/THRILL       []  MEDICAL ASEPTIC PREP   []  No S/S infection  []  Redness []  Drainage  []  Cultured []  Swelling                         []  Pain                          Needle Gauge                            Length:     If Access Problem Dr. Truitt Merle: []  Yes []  No    Date:     [x]  N/A   General Assessment        LUNGS:  SaO2%  [x]  Clear []  Coarse []  Crackles []  Wheezing               []  Diminished Location: []  RLL []  LLL []  RUL []  LUL         COUGH:  []  Productive []  Dry [x]  N/A  RESPIRATIONS: [x]  Easy []  Labored        THERAPY: []  RA   [x]  NC 4    L/min    Mask: []  NRB []  Venti  O2%                  []  Ventilator []  Intubated []  Trach []  BiPap []  CPap       CARDIAC: [x]  Regular []  Irregular []  Pericardial Rub []  JVD                [x]  Monitored Rhythm:        EDEMA: []  None [x]  Generalized []  Facial []  Pedal []  UE []  LE             []  Pitting []  1 []  2 []  3 []  4    []  Right []  Left []  Bilateral       SKIN:    [x]  Warm []  Hot []  Cold  [x]  Dry []  Pale []  Diaphoretic              []  Flushed []  Jaundiced []  Cyanotic []  Rash []  Weeping         LOC:    [x]  Alert  Oriented to: [x]  Person [x]  Place [x]  Time             []  Confused []  Lethargic []  Medically sedated []  Non-responsive        GI/ABDOMEN: []  Flat []  Distended [x]  Soft []  Firm []  Obese []  Diarrhea   []  Bowel Sounds     [] Nausea []  Vomiting         PAIN: [x]  0 []  1 []  2 []  3 []  4 []  5 []  6 []  7 []  8 []  9 []  10          Scale 1-10 Action/Follow Up        MOBILITY: []  Amb []  Amb/Assist [x]  Bed  []  Wheelchair    CUrrent LABS   HBsAg ONLY:  Date Drawn 10/22/16  []  Negative []  Positive  [x]  Unknown.     HBsAb: Date Drawn 10/22/16  []  Susceptible <10 [x]  Immune ?10 []  Unknown       Date of Current Labs:        Lab Results   Component Value Date/Time    Sodium 140 10/22/2016 08:19 AM    Potassium 5.7 (H) 10/22/2016 08:19 AM    Chloride 104 10/22/2016 08:19 AM    CO2 20 (L) 10/22/2016 08:19 AM     BUN 81 (H) 10/22/2016 08:19 AM    Creatinine 11.8 (H) 10/22/2016 08:19 AM    Calcium 6.1 (L) 10/22/2016 08:19 AM    Magnesium 2.1 08/14/2016 08:26 AM    Phosphorus 3.0 08/05/2016 08:36 AM    HCT 25.3 (L) 10/22/2016 08:19 AM    HGB 7.9 (L) 10/22/2016 08:19 AM    PLATELET 93 (L) 10/22/2016 08:19 AM    INR 1.2 (H) 10/22/2016 08:19 AM      Education        Person Educated: [x]  Patient []  Family []  Other         Knowledge base: []  None [x]  Minimal []  Substantial         Barriers to learning  [x]  None         Preferred method of learning: []  Written [x]  Oral []  Visual []  Hands on        Topic: []  Access Care []  S&S of infection []  Fluid Management []  K+                 [x]  Procedural    []  Albumin []  Medications []  Tx Options []  Transplant                  []  Diet []  Other         Teaching Tools: [x]  Explain []  Demonstration []  Handout []  Teachback   Ro/hemodiaylsis machine safety checks- before each treatment   Machine Serial #   []  # 1 [id number]   []  # 2 [id number]    [x]  # 3 [id number]   []  # 4 [id number]   []  # 5 [id number]   []  # 6 [id number]   []  # 7 [id number]      Alarm Test: [x]  Pass Time        RO Serial #   [x]  [id number] (Large dual station RO)  []  # 1  [id number]  (Portable # 1)  []  # 2  [id number]  (Portable # 2)  []  # 3  [id number]  (Portable # 3)  []  # 4  [id number]  (Portable # 4)  []  # 5  [id number]  (Portable # 5)       Lot #s: Dialyzer [id number] exp. 09-09-2019  Tubing 09W11-9 exp. 06-16-2021  Saline [id number] exp.Sept 2019   [x]  RO/Machine Log Complete    [x]  Extracorporeal circuit Tested for integrity       Dialysate: pH 7.4 Temp. 36  Conductivity: Meter 14 HD Machine 14.2   chlorine testing- before each treatment and every 4 hours       Total Chlorine: [x]  Less than 0.1 ppm Time: 1030 2nd Check Time: 1430  (If greater than 0.1 ppm from Primary then every 30 minutes from Secondary)   treatment Iniation-with dialysis precautions   [x]  All Connections Secured   [x]  Saline Line Double Clamped      [x]  Venous Parameters Set    [x]  Arterial Parameters Set    [x]   Prime Given 250 ml            [x]  Air Foam Detector Engaged        [facility] Nurse Signature/Title_Arrianne Berline Chough, RN__    Pre-Treatment   Time 1200 B/P 178/109 HR 86 Temp. 98.2 Resp Rate 18 Pre Wt   UF Calculations:   Wt to lose: 5025ml(+) Oral:  ml(+) IV Meds/Fluids/Blood prods ml(+)  Prime/Rinse 500  ml  (=)Total UF Goal 5500 mL   Scale Type:[]  Bed scale []  Sling Scale []  Wheel Chair Scale [x]   Not Ordered []  Unable to obtain pt on stretcher   Tx Initiation Note: HD started using L subclavian TDC, access patent, good blood flow, aspirated and flushes well. Pt alert and oriented, verbally responsive.    [x]  Time Out/Safety Check  Time: 1130     [facility] Signature/Title_Arrianne Berline Chough, RN   INTRADIALYTIC MONITORING  (SEE ATTACHED FLOWSHEET)   POST TREATMENT    Time 1509 B/P 198/111 HR 98 Temp  97.4 Resp.  Rate 18 Post wt    Time Medication Dose Volume Route Initials                                   [facility] Signatures Title Initials Time   Layla Maw, RN   RN                 [facility] Signature/Title:_Arrianne Berline Chough, RN_                                  Dialyzer cleared: [x]  Good []  Fair []  Poor    Blood Processed 62.3 Liters            Net UF Removed 3168 mL    Post Tx Access:    AVF/AVG: Bleeding Stop                   Art. min Ven min [] +bruit/thrill                             Catheter: Locking Solution heparin 1000u    Art. 2.2 ml Ven 2.2 ml    Post Assessment:  Skin: [x] Warm [x] Dry [] Diaphoretic [] Flushed []  Pale [] Cyanotic          Lungs: [x] Clear [] Coarse [] Crackles [] Wheezing          Cardiac: [x] Regular [] Irregular  [] Monitored rhythm         Edema: [] None [x] General [] Facial [] Pedal  [] UE [] LE [] RIGHT                      [] LEFT    [] Bilateral      Pain: [x] 0 [] 1[] 2 [] 3 [] 4 [] 5 [] 6 [] 7 [] 8 [] 9 [] 10   POST Tx Note: HD completed, pt rinsed back 6 mins early per pt's request, all blood returned, tdc ports flush with normal saline and lock with  heparin. Pt remains alert and oriented, verbally responsive. Report given to primary nurse.    Primary Nurse Report: First initial/Last name/Title    Post Dialysis:_ K. Blunt, RN        Time:_ 1514   Abbreviations: AVG-arterial venous graft, AVF-arterial venous fistula, IJ-Internal Jugular,  Subcl-Subclavian, Fem-Femoral, Tx-treatment, AP/HR-apical heart rate, DFR-dialysate flow rate, BFR-blood flow rate, AP-arterial pressure, VP-venous pressure, UF-ultrafiltrate, TMP-transmembrane pressure, Ven-Venous, Art-Arterial, RO-Reverse Osmosis

## 2016-10-22 NOTE — ED Notes (Signed)
Pt sleeping in room , no needs at this time.  Warm blanket given.  Call bell within reach.

## 2016-10-22 NOTE — Progress Notes (Signed)
Problem: Falls - Risk of  Goal: *Absence of Falls  Document Schmid Fall Risk and appropriate interventions in the flowsheet.   Outcome: Progressing Towards Goal  Fall Risk Interventions:            Medication Interventions: Patient to call before getting OOB, Teach patient to arise slowly

## 2016-10-22 NOTE — Consults (Signed)
Renal Consult     Patient: David [name] Age: 46 y.o. Sex: male    Date of Birth: September 21, 1970 Admit Date: 10/22/2016 PCP: None   MRN: [id number]  CSN: [id number]       Chief Complaint:  Chief Complaint   Patient presents with   ??? Chest Pain   ??? Diarrhea       Assessment:   1. End stage renal disease. The patient last dialyzed last Friday. He has a history of noncompliance per chart review and patient admission and has been dialyzing at the emergency room at [facility] despite the fact that he lives in [place]. He is clearly overloaded and mildly hyperkalemic.  2. Recurrent Clostridium difficile colitis. The patient is been seen by gastroenterology multiple times and apparently a fecal transplant was attempted in November 2017 but had to be aborted due to an incomplete colon prep. Y this is not been reattempted is not clear to me from chart review but I think that he has been told it was not possible were he lives.  3. Anemia likely due to end-stage renal disease. This hemoglobin is consistent with previous values but I have no data to support whether or not he is iron deficient.  4. Long-standing hypertension. This is a recurrent issue but is clearly overloaded at the current time.  5. Recurrent ascites. For what I can find in the computer, the patient's liver imaging has been normal and he is hepatitis B and C negative. This would appear to be ascites related to end-stage renal disease. He did have an echocardiogram in 2017 but I cannot see any, and in his right ventricular pressure.     Recommendations:   1. Dialysis today for 3 hours and attempt to remove 5 L. The orders are in, and I have called the renal dialysis unit.  2. I will check iron stores on dialysis today. I will also dosing with erythropoietin on dialysis today.  3. He has been started on oral vancomycin appropriately first recurrent C. difficile however this may be a futile effort as he has tried this many  times in the past. Unfortunately, due to his recurrent ascites is going to be very difficult to treat his Clostridium difficile as he may actually have secondary seeding of his ascites with the C. difficile. This is rare but has been reported.  4. Consider gastroenterology consultation regarding recurrent Clostridium difficile.  5. The patient states that he might be trying to move into the area. Given his history of questionable compliance I am very hesitant to accept him into a standard outpatient dialysis unit. I will work with discharge planning to see what exactly has been going on.  6. He may well be a paracentesis for symptom relief and he says he's been getting this at least weekly for some time.    Patient Active Problem List   Diagnosis Code   ??? Gastroenteritis K52.9   ??? Peritonitis (HCC) K65.9   ??? Recurrent Clostridium difficile diarrhea A04.71   ??? Ascites R18.8   ??? ESRD (end stage renal disease) on dialysis (HCC) N18.6, Z99.2   ??? H/O noncompliance with medical treatment, presenting hazards to health Z91.19   ??? Diarrhea of infectious origin A09   ??? Left arm swelling M79.89   ??? Pain in left upper arm M79.622   ??? C. difficile colitis A04.72   ??? Hyperkalemia E87.5   ??? NSTEMI (non-ST elevated myocardial infarction) (HCC) I21.4   ??? Volume overload E87.70   ???  Clostridium difficile diarrhea A04.72   ??? Hypertensive urgency I16.0   ??? Asthma J45.909   ??? Abdominal distention R14.0   ??? Diarrhea, infectious, adult A09   ??? ESRD (end stage renal disease) (HCC) N18.6   ??? Uremia N19   ??? Vomiting R11.10   ??? Hyperkalemia, diminished renal excretion E87.5   ??? Other chest pain R07.89   ??? Generalized abdominal pain R10.84       Problem list:   1. Volume overload  2. Hyperkalemia  3. End-stage renal disease since 2011 due to long-standing hypertension  4. Ascites likely due to end-stage renal disease. He gets frequent paracentesis.  5. Recurrent Clostridium difficile infections for approximately the last 9  months. Fecal transplant has been recommended but has not yet performed.  6. Long-standing hypertension  7. Multiple attempts at AV fistulas Navy grafts in his upper extremities all have failed.  8. Insertion of an removal of a peritoneal dialysis catheter. He apparently got peritonitis rather quickly once it was placed  9. Status post left internal jugular hemodialysis catheter placement  10. Anemia of chronic kidney disease  11. Obstructive sleep apnea not on CPAP requiring home oxygen  12. Check echocardiogram to evaluate right heart pressures    History of Present Illness    I was asked by Dr. Jerl Minaamarato to see this patient in consult. David [name] is a 46 y.o. year old male BLACK OR A[place] who presents with dyspnea. The patient apparently dialyzes at the emergency room [facility] [place] and was down visiting a friend in this area. He has not gone to dialysis since last Friday. He states he is quite short of breath and also notes that his diarrhea is back. He was recently treated with oral vancomycin as well as metronidazole for recurrent Clostridium difficile. No fevers or chills. His his abdomen is crampy. No chest pain. No palpitations. He does have edema. No other complains at this time.    Review of Systems   Constitutional: Negative for fever and chills.   Skin: Negative for rash or skin lesions.   Eyes: Sclera clear  Cardiovascular:  Negative for chest pain and palpitations.   Respiratory: Negative for cough and hemoptysis, orthopnea or pnd. Positive for dyspnea and edema  Gastrointestinal: Negative for nausea and vomitting. Negative for abdominal pain,constipation or melena. Positive for diarrhea  Genitourinary: Negative for dysuria,blood in the urine,frequency or burning on micturition. No difficulty with initiating urination.  Musculoskeletal: Negative  for arthralgias,back pain or neck pain.  Neurological: Negative for dizziness or syncopal episodes   Psychiatric: Negative.      Past Medical/Psychiatric/Surgical History     Past Medical History:   Diagnosis Date   ??? Adverse effect of anesthesia     sleep apnea uses oxygen at night    ??? Ascites    ??? Asthma    ??? C. difficile colitis    ??? Chronic respiratory failure with hypoxia (HCC)     on 3-4L n/c at home.   ??? ESRD (end stage renal disease) on dialysis [facility](HCC)    ??? HTN (hypertension)    ??? Myocardial infarct [facility](HCC)    ??? OSA (obstructive sleep apnea)     reports does not have cpap   ??? Peritoneal dialysis catheter infection (HCC)    ??? SBP (spontaneous bacterial peritonitis) (HCC)    ??? Vomiting 08/20/2016     Past Surgical History:   Procedure Laterality Date   ??? COLONOSCOPY N/A 03/04/2016  COLONOSCOPY performed by Sandy Salaam, MD at [facility]   ??? HX HERNIA REPAIR  2008    right inguinal and umbilical   ??? HX VASCULAR ACCESS      Hx of one site on R, 2 on L, currently dialyzing through L Tunnel cath.   ??? SIGMOIDOSCOPY,DIAGNOSTIC  03/04/2016          Past Surgical History:   Procedure Laterality Date   ??? COLONOSCOPY N/A 03/04/2016    COLONOSCOPY performed by Sandy Salaam, MD at [facility]   ??? HX HERNIA REPAIR  2008    right inguinal and umbilical   ??? HX VASCULAR ACCESS      Hx of one site on R, 2 on L, currently dialyzing through L Tunnel cath.   ??? SIGMOIDOSCOPY,DIAGNOSTIC  03/04/2016             Social History   He has smoked for many years but is down to 3 cigarettes a day as he is trying to quit. He does not drink or use drugs. He was in the [place] for 10 years where he "jumped out of perfectly good airplanes". 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      Family History   Noncontributory    Medications     Current Medications:  Current Facility-Administered Medications   Medication Dose Route Frequency Provider Last Rate Last Dose   ??? vancomycin 100 mg/ml oral solution (compounded) 125 mg  125 mg Oral Q6H Konrad Felix, MD   125 mg at 10/22/16 1138    ??? metroNIDAZOLE (FLAGYL) tablet 500 mg  500 mg Oral Q8H Konrad Felix, MD   500 mg at 10/22/16 1110   ??? naloxone [facility]) injection 0.1 mg  0.1 mg IntraVENous PRN Scarlette Ar, MD       ??? acetaminophen (TYLENOL) tablet 650 mg  650 mg Oral Q4H PRN Scarlette Ar, MD        Or   ??? acetaminophen (TYLENOL) solution 650 mg  650 mg Oral Q4H PRN Scarlette Ar, MD        Or   ??? acetaminophen (TYLENOL) suppository 650 mg  650 mg Rectal Q4H PRN Scarlette Ar, MD       ??? HYDROcodone-acetaminophen (NORCO) 5-325 mg per tablet 1 Tab  1 Tab Oral Q4H PRN Scarlette Ar, MD       ??? HYDROmorphone (PF) (DILAUDID) injection 1 mg  1 mg IntraVENous Q4H PRN Scarlette Ar, MD       ??? diphenhydrAMINE (BENADRYL) capsule 25 mg  25 mg Oral Q4H PRN Scarlette Ar, MD       ??? ondansetron [facility]) injection 4 mg  4 mg IntraVENous Q4H PRN Scarlette Ar, MD       ??? albuterol (PROVENTIL VENTOLIN) nebulizer solution 2.5 mg  2.5 mg Nebulization Q4H PRN Scarlette Ar, MD       ??? heparin (porcine) injection 5,000 Units  5,000 Units SubCUTAneous Q8H Scarlette Ar, MD         Current Outpatient Prescriptions   Medication Sig Dispense Refill   ??? sevelamer carbonate (RENVELA) 800 mg tab tab Take 800 mg by mouth three (3) times daily.     ??? carvedilol (COREG) 25 mg tablet Take 25 mg by mouth two (2) times daily (with meals).     ??? hydrALAZINE (APRESOLINE) 100 mg tablet Take 100 mg by mouth three (3) times daily.     ??? amLODIPine (NORVASC) 10 mg  tablet Take 10 mg by mouth daily.     ??? furosemide (LASIX) 80 mg tablet Take 80 mg by mouth daily.     ??? albuterol (PROVENTIL HFA, VENTOLIN HFA, PROAIR HFA) 90 mcg/actuation inhaler Take 2 Puffs by inhalation every four (4) hours as needed for Wheezing.         Prior to Admission Medications:    (Not in a hospital admission)    Allergies     Allergies   Allergen Reactions   ??? Lisinopril Shortness of Breath   ??? Morphine Shortness of Breath   ??? Toradol [Ketorolac] Shortness of Breath        Physical Exam     Visit Vitals   ??? BP (!) 174/117   ??? Pulse 91   ??? Temp 98.1 ??F (36.7 ??C)   ??? Resp 19   ??? Ht 6\' 1"  (1.854 m)   ??? Wt 115.7 kg (255 lb)   ??? SpO2 99%   ??? BMI 33.64 kg/m2     GEN:  Morbidly obese, on oxygen and in mild respiratory distress  HEENT: sclerae anicteric, conjunctivae WNL   NECK: No JVD,Trachea midline. No thyromegally  LYMPHORETICULAR:  No lymphadenopathy.   LUNGS: Decreased sounds at the bases bilaterally with only moderate air movement  CVS: Heart RRR, No S4 or S3 Gallop, No Pericardial Rub.  ABD: Moderate distention with firmness, normoactive bowel sounds without tenderness palpation, his abdominal wall has history a with edema and it from the ascites.  BACK:  No  Spinal or CVA  tenderness. No deformities  EXTR: 2+ edema up to the flanks.   SKIN: Skin without rashes, NL turgor  CNS: Speech normal, alert and oriented    Laboratory:     CBC w/Diff  Lab Results   Component Value Date/Time    WBC 5.3 10/22/2016 08:19 AM    RBC 2.57 (L) 10/22/2016 08:19 AM    HCT 25.3 (L) 10/22/2016 08:19 AM    MCV 98.4 (H) 10/22/2016 08:19 AM    MCH 30.7 10/22/2016 08:19 AM    MCHC 31.2 10/22/2016 08:19 AM    RDW 16.5 (H) 09/21/2016 10:53 PM     Lab Results   Component Value Date/Time    MONOS 7.9 10/22/2016 08:19 AM    EOS 9.0 (H) 10/22/2016 08:19 AM    BASOS 0.6 10/22/2016 08:19 AM    RDW 16.5 (H) 09/21/2016 10:53 PM       Recent Labs      10/22/16   0819   GLU  100   NA  140   K  5.7*   CL  104   CO2  20*   BUN  81*   CREA  11.8*   CA  6.1*   AGAP  16*   AP  257*   TP  6.6   ALB  3.1*   ALT  17   SGOT  29          Thankyou for asking me to assist in this patient's care.  Will follow.    Herma Carson, MD  October 22, 2016 11:52 AM  [facility]  [phone]

## 2016-10-22 NOTE — Consults (Addendum)
Infectious Disease Consultation Note    Requested by: Dr. Jerl Mina    Reason: Recurrent C difficile infection    Current abx Prior abx   Po vancomycin 7/6 - 0       ASSESSMENT - > REC:     Recurrent diarrhea  - likely recurrent C difficile -> await stool C diff  -> continue po vancomycin  -> dc metronidazole  -> if still positive C diff - may benefit from FMT   H/o recurrent C difficile infection  - says began in January 2017 following antibiotic rx for pneumonia  - thinks he has had 17 episodes since.  Prior treatment includes oral metronidazole, vancomycin and dificid    ESRD-HD  - h/o non-compliance with dialysis  - has L TDC.  Multiple failed AVG's -> per Renal   HTN  - uncontrolled  - cardiomegaly on CXR 7/6    Right ?subclavian stent  - seen on CXR 7/6    Ascites  - says prior liver biopsy at [facility] was normal  - ?renal    Asthma      MICROBIOLOGY:   none    LINES AND CATHETERS:   TDC      HPI:    46 year-old African-American male with h/o documented non-compliance, ESRD-HD, ascites, HTN, Asthma, recurrent C difficile associated diarrhea admitted to [facility] 10/22/2016 due to chest pain and diarrhea.  He has a extensively documented history on [employer]) of non compliance with medical advice, missing hemodialysis sessions and frequent visits to ED's due to chest pain and diarrhea.  He has been dismissed from hemodialysis untis for missing appointments. There is also history of recurrent ascites requiring repeated paracenteses.  Also documented is a history of signing out AMA from inpatient and ED's when declined IV pain medications.  He is from [place] and was seeking care at [facility] but around 3 months PTA began going to three different hospitals in the [place] area.      He has been having recurrent C difficile colitis for over a year and available records indicate he was given a vancomycin taper in March 2018  and recommended FMT.  FMT had been attempted in 02/2016 but solid stool was found and procedure was not completed.  Oral vancomycin was given again the end of March 2018, and he signed out [facility] from [place] ED in [place] 08/20/2016.   He returned to that ED on 6/5 (4 weeks PTA) due to chest pain and diarrhea but left AMA before being seen by MD.   He tells me was started on oral vancomycin about 5 weeks PTA and this was tapered over the next 3 weeks and ended 2 weeks PTA.  His liquid stools 7-8 x a day improved to soft stools 1-2x a day, which is his usual response to past C difficile treatment with vancomycin, metronidazole and even dificid.  5 days PTA he had recurrence of the watery stools again around 7x a day and accompanied by intermittent right chest pain for which he presented to the [facility] ED today (he was visiting a friend in the area and is planning to move here).  He was admitted and oral vancomycin and metronidazole were started.    Past Medical History:   Diagnosis Date   ??? Adverse effect of anesthesia     sleep apnea uses oxygen at night    ??? Ascites    ??? Asthma    ??? C. difficile colitis    ???  Chronic respiratory failure with hypoxia (HCC)     on 3-4L n/c at home.   ??? ESRD (end stage renal disease) on dialysis [facility](HCC)    ??? HTN (hypertension)    ??? Myocardial infarct [facility](HCC)    ??? OSA (obstructive sleep apnea)     reports does not have cpap   ??? Peritoneal dialysis catheter infection (HCC)    ??? SBP (spontaneous bacterial peritonitis) (HCC)    ??? Vomiting 08/20/2016       Past Surgical History:   Procedure Laterality Date   ??? COLONOSCOPY N/A 03/04/2016    COLONOSCOPY performed by Sandy SalaamAlex R Seamon, MD at [facility]   ??? HX HERNIA REPAIR  2008    right inguinal and umbilical   ??? HX VASCULAR ACCESS      Hx of one site on R, 2 on L, currently dialyzing through L Tunnel cath.   ??? SIGMOIDOSCOPY,DIAGNOSTIC  03/04/2016            Allergies: Lisinopril; Morphine; and Toradol [ketorolac] .     Current Facility-Administered Medications   Medication Dose Route Frequency   ??? vancomycin 100 mg/ml oral solution (compounded) 125 mg  125 mg Oral Q6H   ??? metroNIDAZOLE (FLAGYL) tablet 500 mg  500 mg Oral Q8H   ??? [START ON 10/23/2016] amLODIPine (NORVASC) tablet 10 mg  10 mg Oral DAILY   ??? carvedilol (COREG) tablet 25 mg  25 mg Oral BID WITH MEALS   ??? hydrALAZINE (APRESOLINE) tablet 100 mg  100 mg Oral TID   ??? sevelamer carbonate (RENVELA) tab 800 mg  800 mg Oral TID   ??? naloxone (NARCAN) injection 0.1 mg  0.1 mg IntraVENous PRN   ??? acetaminophen (TYLENOL) tablet 650 mg  650 mg Oral Q4H PRN    Or   ??? acetaminophen (TYLENOL) solution 650 mg  650 mg Oral Q4H PRN    Or   ??? acetaminophen (TYLENOL) suppository 650 mg  650 mg Rectal Q4H PRN   ??? HYDROcodone-acetaminophen (NORCO) 5-325 mg per tablet 1 Tab  1 Tab Oral Q4H PRN   ??? diphenhydrAMINE (BENADRYL) capsule 25 mg  25 mg Oral Q4H PRN   ??? ondansetron (ZOFRAN) injection 4 mg  4 mg IntraVENous Q4H PRN   ??? albuterol (PROVENTIL VENTOLIN) nebulizer solution 2.5 mg  2.5 mg Nebulization Q4H PRN   ??? heparin (porcine) injection 5,000 Units  5,000 Units SubCUTAneous Q8H   ??? HYDROmorphone (PF) (DILAUDID) injection 0.5 mg  0.5 mg IntraVENous Q4H PRN   ??? [START ON 10/23/2016] aspirin chewable tablet 81 mg  81 mg Oral DAILY       Family History   Problem Relation Age of Onset   ??? Hypertension Mother    ??? Kidney Disease Neg Hx    ??? Liver Disease Neg Hx      Social History     Social History   ??? Marital status: SINGLE     Spouse name: N/A   ??? Number of children: N/A   ??? Years of education: N/A     Occupational History   ??? Not on file.     Social History Main Topics   ??? Smoking status: Current Every Day Smoker     Packs/day: 0.25     Years: 10.00   ??? Smokeless tobacco: Never Used      Comment: 3 cigs a day   ??? Alcohol use No   ??? Drug use: No   ??? Sexual activity: Not on file  Other Topics Concern   ??? Not on file     Social History Narrative     History   Smoking Status    ??? Current Every Day Smoker   ??? Packs/day: 0.25   ??? Years: 10.00   Smokeless Tobacco   ??? Never Used     Comment: 3 cigs a day        Temp (24hrs), Avg:97.7 ??F (36.5 ??C), Min:97.2 ??F (36.2 ??C), Max:98.2 ??F (36.8 ??C)    Visit Vitals   ??? BP (!) 169/129 (BP 1 Location: Right arm, BP Patient Position: Sitting)   ??? Pulse (!) 113   ??? Temp 97.2 ??F (36.2 ??C)   ??? Resp 22   ??? Ht 6\' 1"  (1.854 m)   ??? Wt 115.7 kg (255 lb)   ??? SpO2 98%   ??? BMI 33.64 kg/m2       ROS:A comprehensive review of systems was negative except for that written in the History of Present Illness.    Physical Exam:    General: Well developed, well nourished 46 y.o. African American male in no acute distress.  ENT: ENT exam normal, no neck nodes or sinus tenderness  Head: normocephalic, without obvious abnormality  Mouth:  mucous membranes moist, pharynx normal without lesions  Neck: supple, symmetrical, trachea midline   Cardio:  regular rate and rhythm, S1, S2 normal, no murmur, click, rub or gallop  Chest: inspection normal - no chest wall deformities or tenderness, respiratory effort normal  Lungs: clear to auscultation, no wheezes or rales and unlabored breathing  Abdomen: distended, lower abdomen with black hyperpigmentation, induration, non-tender, no organomegaly  Extremities:  Bilateral LE edema 2+  Neuro: Grossly normal      Labs: Results:   Chemistry Recent Labs      10/22/16   0819   GLU  100   NA  140   K  5.7*   CL  104   CO2  20*   BUN  81*   CREA  11.8*   CA  6.1*   AGAP  16*   AP  257*   TP  6.6   ALB  3.1*      CBC w/Diff Recent Labs      10/22/16   0819   WBC  5.3   RBC  2.57*   HGB  7.9*   HCT  25.3*   PLT  93*   GRANS  74.0*   LYMPH  8.3*   EOS  9.0*      Microbiology No results for input(s): CULT in the last 72 hours.     Maryelizabeth Kaufmann, M.D.  [facility]   [phone]

## 2016-10-22 NOTE — Consults (Signed)
Consults  by David [name], David Skousen L, MD at 10/22/16 1517                Author: Nolon [name], David Novakowski L, MD  Service: Infectious Disease  Author Type: Physician       Filed: 10/22/16 1648  Date of Service: 10/22/16 1517  Status: Addendum          Editor: David [name], David Fedie L, MD (Physician)          Related Notes: Original Note by David [name], David Nowak L, MD (Physician) filed at 10/22/16 1646            Consult Orders        1. IP CONSULT TO PHYSICIAN [[id number]][[id number]] ordered by David [name], Joseph, MD at 10/22/16 1149                                         Infectious Disease Consultation Note      Requested by: Dr. 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  Prior treatment includes oral metronidazole, vancomycin and dificid       ESRD-HD   - h/o non-compliance with dialysis   - has [name] TDC.  Multiple failed AVG's  -> per Renal     HTN   - uncontrolled   - cardiomegaly on CXR 7/6       Right ?subclavian stent   - seen on CXR 7/6       Ascites   - says prior liver biopsy at [facility] was normal   - ?renal       Asthma            MICROBIOLOGY:     none        LINES AND CATHETERS:     TDC         HPI:      46 year-old African-American male with h/o documented non-compliance, ESRD-HD, ascites, HTN, Asthma, recurrent C difficile associated diarrhea admitted to [facility] 10/22/2016 due to chest pain and diarrhea.   He has a extensively documented history on [employer] of non compliance with medical advice, missing hemodialysis sessions and frequent visits to ED's due to chest pain and  diarrhea.  He has been dismissed from hemodialysis untis for missing  appointments. There is also history of recurrent ascites requiring repeated paracenteses.  Also documented is a history of signing out AMA from inpatient and ED's when declined IV pain medications.  He is from [place] and was seeking care at [facility] but around 3 months PTA began going to three different hospitals in the [place] area.        He has been having  recurrent C difficile colitis for over a year and available records indicate he was given a vancomycin taper in March 2018 and recommended FMT.  FMT had been attempted in 02/2016 but solid stool was found and procedure was not completed.   Oral vancomycin was given again the end of March 2018, and he signed out [facility] from [place] ED in [place] 08/20/2016.   He returned to that ED on 6/5 (4 weeks PTA) due to chest pain and diarrhea but left AMA before being seen by MD.   He tells me was  started on oral vancomycin about 5 weeks PTA and this was tapered over the next 3 weeks and ended 2 weeks PTA.  His liquid stools 7-8 x a day improved to soft stools 1-2x a day, which is his usual response to past C difficile treatment with vancomycin,  metronidazole and even dificid.  5 days PTA he had recurrence of the watery stools again around 7x a day and accompanied by intermittent right chest pain for which he presented to the [facility] ED today (he was visiting a friend in the area and is planning  to move here).  He was admitted and oral vancomycin and metronidazole were started.        Past Medical History:        Diagnosis  Date         ?  Adverse effect of anesthesia            sleep apnea uses oxygen at night          ?  Ascites       ?  Asthma       ?  C. difficile colitis       ?  Chronic respiratory failure with hypoxia (HCC)            on 3-4L n/c at home.         ?  ESRD (end stage renal disease) on dialysis [facility])       ?  HTN (hypertension)       ?  Myocardial infarct  [facility])       ?  OSA (obstructive sleep apnea)            reports does not have cpap         ?  Peritoneal dialysis catheter infection (HCC)       ?  SBP (spontaneous bacterial peritonitis) (HCC)           ?  Vomiting  08/20/2016             Past Surgical History:         Procedure  Laterality  Date          ?  COLONOSCOPY  N/A  03/04/2016          COLONOSCOPY performed by Sandy Salaam, MD at [facility]          ?  HX HERNIA REPAIR    2008          right inguinal and umbilical          ?  HX VASCULAR ACCESS              Hx of one site on R, 2 on [name], currently dialyzing through [name] Tunnel cath.          ?  SIGMOIDOSCOPY,DIAGNOSTIC    03/04/2016  Allergies: Lisinopril; Morphine; and Toradol [ketorolac]  .        Current Facility-Administered Medications          Medication  Dose  Route  Frequency           ?  vancomycin 100 mg/ml oral solution (compounded) 125 mg   125 mg  Oral  Q6H     ?  metroNIDAZOLE (FLAGYL) tablet 500 mg   500 mg  Oral  Q8H     ?  [START ON 10/23/2016] amLODIPine (NORVASC) tablet 10 mg   10 mg  Oral  DAILY     ?  carvedilol (COREG) tablet 25 mg   25 mg  Oral  BID WITH MEALS     ?  hydrALAZINE (APRESOLINE) tablet 100 mg   100 mg  Oral  TID     ?  sevelamer carbonate (RENVELA) tab 800 mg   800 mg  Oral  TID     ?  naloxone (NARCAN) injection 0.1 mg   0.1 mg  IntraVENous  PRN     ?  acetaminophen (TYLENOL) tablet 650 mg   650 mg  Oral  Q4H PRN          Or           ?  acetaminophen (TYLENOL) solution 650 mg   650 mg  Oral  Q4H PRN          Or           ?  acetaminophen (TYLENOL) suppository 650 mg   650 mg  Rectal  Q4H PRN     ?  HYDROcodone-acetaminophen (NORCO) 5-325 mg per tablet 1 Tab   1 Tab  Oral  Q4H PRN     ?  diphenhydrAMINE (BENADRYL) capsule 25 mg   25 mg  Oral  Q4H PRN     ?  ondansetron (ZOFRAN) injection 4 mg   4 mg  IntraVENous  Q4H PRN     ?  albuterol (PROVENTIL VENTOLIN) nebulizer solution 2.5 mg   2.5 mg  Nebulization  Q4H PRN     ?  heparin (porcine) injection  5,000 Units   5,000 Units  SubCUTAneous  Q8H     ?  HYDROmorphone (PF) (DILAUDID) injection 0.5 mg   0.5 mg  IntraVENous  Q4H PRN           ?  [START ON 10/23/2016] aspirin chewable tablet 81 mg   81 mg  Oral  DAILY             Family History         Problem  Relation  Age of Onset          ?  Hypertension  Mother       ?  Kidney Disease  Neg Hx            ?  Liver Disease  Neg Hx            Social History          Social History         ?  Marital status:  SINGLE              Spouse name:  N/A         ?  Number of children:  N/A         ?  Years of education:  N/A          Occupational History        ?  Not on file.          Social History Main Topics         ?  Smoking status:  Current Every Day Smoker              Packs/day:  0.25         Years:  10.00         ?  Smokeless tobacco:  Never Used                Comment: 3 cigs a day         ?  Alcohol use  No     ?  Drug use:  No         ?  Sexual activity:  Not on file           Other Topics  Concern        ?  Not on file          Social History Narrative          History       Smoking Status        ?  Current Every Day Smoker         ?  Packs/day:  0.25     ?  Years:  10.00       Smokeless Tobacco        ?  Never Used             Comment: 3 cigs a day           Temp (24hrs), Avg:97.7 ??F (36.5 ??C), Min:97.2 ??F (36.2 ??C), Max:98.2 ??F (36.8 ??C)        Visit Vitals         ?  BP  (!) 169/129 (BP 1 Location: Right arm, BP Patient Position: Sitting)     ?  Pulse  (!) 113     ?  Temp  97.2 ??F (36.2 ??C)     ?  Resp  22     ?  Ht  6\' 1"  (1.854 m)     ?  Wt  115.7 kg (255 lb)     ?  SpO2  98%         ?  BMI  33.64 kg/m2           ROS:A comprehensive review of systems was negative except for that written in the History of Present Illness.      Physical Exam:      General: Well developed, well nourished 46 y.o. African American male  in no acute distress.   ENT: ENT exam normal, no neck nodes or sinus tenderness   Head: normocephalic, without obvious abnormality   Mouth:   mucous membranes moist, pharynx normal without lesions   Neck: supple, symmetrical, trachea midline    Cardio:  regular rate and rhythm, S1, S2 normal, no murmur, click, rub or gallop   Chest: inspection normal - no chest wall deformities or tenderness, respiratory effort normal   Lungs: clear to auscultation, no wheezes or rales and unlabored breathing   Abdomen: distended, lower abdomen with black hyperpigmentation, induration, non-tender, no organomegaly   Extremities:  Bilateral LE edema 2+   Neuro: Grossly normal            Labs:  Results:        Chemistry  Recent Labs          10/22/16    [phone]  GLU   100      NA   140      K   5.7*      CL   104      CO2   20*      BUN   81*      CREA   11.8*      CA   6.1*      AGAP   16*      AP   257*      TP   6.6      ALB   3.1*              CBC w/Diff  Recent Labs          10/22/16    0819      WBC   5.3      RBC   2.57*      HGB   7.9*      HCT   25.3*      PLT   93*      GRANS   74.0*      LYMPH   8.3*      EOS   9.0*              Microbiology  No results for input(s): CULT in the last 72 hours.        Maryelizabeth Kaufmann, M.D.   [facility]    [phone]

## 2016-10-22 NOTE — Consults (Signed)
Consults  by Herma Carson, MD at 10/22/16 1151                Author: Herma Carson, MD  Service: Nephrology  Author Type: Physician       Filed: 10/22/16 1203  Date of Service: 10/22/16 1151  Status: Signed          Editor: Herma Carson, MD (Physician)            Consult Orders        1. IP CONSULT TO PHYSICIAN [[id number]] ordered by Scarlette Ar, MD at 10/22/16 1149                                         Renal Consult           Patient: David [name]  Age: 46 y.o.  Sex: male          Date of Birth: [birth year]  Admit Date: 10/22/2016  PCP: None     MRN: [id number]   CSN: [id number]           Chief Complaint:     Chief Complaint       Patient presents with        ?  Chest Pain        ?  Diarrhea             Assessment:     1.  End stage renal disease. The patient last dialyzed last Friday. He has a history of noncompliance per chart review and patient admission and  has been dialyzing at the emergency room at [facility] despite the fact that he lives in [place]. He is clearly overloaded and mildly hyperkalemic.   2.  Recurrent Clostridium difficile colitis. The patient is been seen by gastroenterology multiple times and apparently a fecal transplant was attempted in November 2017 but had to be aborted due to an incomplete colon prep. Y this is not been reattempted  is not clear to me from chart review but I think that he has been told it was not possible were he lives.   3.  Anemia likely due to end-stage renal disease. This hemoglobin is consistent with previous values but I have no data to support whether or not he is iron deficient.   4.  Long-standing hypertension. This is a recurrent issue but is clearly overloaded at the current time.   5.  Recurrent ascites. For what I can find in the computer, the patient's liver imaging has been normal and he is hepatitis B and C negative. This would appear to be ascites related to end-stage renal disease. He did have  an echocardiogram in 2017 but I cannot  see any, and in his right ventricular pressure.         Recommendations:     1.  Dialysis today for 3 hours and attempt to remove 5 L. The orders are in, and I have called the renal dialysis unit.   2.  I will check iron stores on dialysis today. I will also dosing with erythropoietin on dialysis today.   3.  He has been started on oral vancomycin appropriately first recurrent C. difficile however this may be a futile effort as he has tried this many times in the past. Unfortunately, due to his recurrent ascites is going to be very difficult to treat his  Clostridium  difficile as he may actually have secondary seeding of his ascites with the C. difficile. This is rare but has been reported.   4.  Consider gastroenterology consultation regarding recurrent Clostridium difficile.   5.  The patient states that he might be trying to move into the area. Given his history of questionable compliance I am very hesitant to accept him into a standard outpatient dialysis unit. I will work with discharge planning to see what exactly has been  going on.   6.  He may well be a paracentesis for symptom relief and he says he's been getting this at least weekly for some time.        Patient Active Problem List        Diagnosis  Code         ?  Gastroenteritis  K52.9     ?  Peritonitis (HCC)  K65.9     ?  Recurrent Clostridium difficile diarrhea  A04.71     ?  Ascites  R18.8     ?  ESRD (end stage renal disease) on dialysis (HCC)  N18.6, Z99.2     ?  H/O noncompliance with medical treatment, presenting hazards to health  Z91.19     ?  Diarrhea of infectious origin  A09     ?  Left arm swelling  M79.89     ?  Pain in left upper arm  M79.622     ?  C. difficile colitis  A04.72     ?  Hyperkalemia  E87.5     ?  NSTEMI (non-ST elevated myocardial infarction) (HCC)  I21.4     ?  Volume overload  E87.70     ?  Clostridium difficile diarrhea  A04.72     ?  Hypertensive urgency  I16.0     ?  Asthma   J45.909     ?  Abdominal distention  R14.0     ?  Diarrhea, infectious, adult  A09     ?  ESRD (end stage renal disease) (HCC)  N18.6     ?  Uremia  N19     ?  Vomiting  R11.10     ?  Hyperkalemia, diminished renal excretion  E87.5     ?  Other chest pain  R07.89         ?  Generalized abdominal pain  R10.84             Problem list:     1.  Volume overload   2.  Hyperkalemia   3.  End-stage renal disease since 2011 due to long-standing hypertension   4.  Ascites likely due to end-stage renal disease. He gets frequent paracentesis.   5.  Recurrent Clostridium difficile infections for approximately the last 9 months. Fecal transplant has been recommended but has not yet performed.   6.  Long-standing hypertension   7.  Multiple attempts at AV fistulas Navy grafts in his upper extremities all have failed.   8.  Insertion of an removal of a peritoneal dialysis catheter. He apparently got peritonitis rather quickly once it was placed   9.  Status post left internal jugular hemodialysis catheter placement   10.  Anemia of chronic kidney disease   11.  Obstructive sleep apnea not on CPAP requiring home oxygen   12.  Check echocardiogram to evaluate right heart pressures        History of Present Illness  I was asked by Dr. Jerl Mina to see this patient in consult. David [name]  is a 46 y.o. year old male  BLACK [place] who presents with dyspnea. The patient apparently dialyzes at the emergency room at [place] and was down visiting a friend in this area. He has not gone  to dialysis since last Friday. He states he is quite short of breath and also notes that his diarrhea is back. He was recently treated with oral vancomycin as well as metronidazole for recurrent Clostridium difficile. No fevers or chills. His his abdomen  is crampy. No chest pain. No palpitations. He does have edema. No other complains at this time.        Review of Systems     Constitutional: Negative for fever  and  chills.    Skin: Negative for rash or skin lesions.    Eyes: Sclera clear   Cardiovascular:  Negative for chest pain and palpitations.    Respiratory: Negative for cough and hemoptysis, orthopnea or pnd. Positive for dyspnea  and edema   Gastrointestinal: Negative for nausea and vomitting. Negative for abdominal pain,constipation or melena.  Positive for diarrhea   Genitourinary: Negative for dysuria,blood in the urine,frequency or burning on micturition. No difficulty with initiating urination.   Musculoskeletal: Negative  for arthralgias,back pain or neck pain.   Neurological: Negative for dizziness or syncopal episodes    Psychiatric: Negative.         Past Medical/Psychiatric/Surgical History          Past Medical History:        Diagnosis  Date         ?  Adverse effect of anesthesia            sleep apnea uses oxygen at night          ?  Ascites       ?  Asthma       ?  C. difficile colitis       ?  Chronic respiratory failure with hypoxia (HCC)            on 3-4L n/c at home.         ?  ESRD (end stage renal disease) on dialysis [facility])       ?  HTN (hypertension)       ?  Myocardial infarct [facility])       ?  OSA (obstructive sleep apnea)            reports does not have cpap         ?  Peritoneal dialysis catheter infection (HCC)       ?  SBP (spontaneous bacterial peritonitis) (HCC)           ?  Vomiting  08/20/2016          Past Surgical History:         Procedure  Laterality  Date          ?  COLONOSCOPY  N/A  03/04/2016          COLONOSCOPY performed by Sandy Salaam, MD at [facility]          ?  HX HERNIA REPAIR    2008          right inguinal and umbilical          ?  HX VASCULAR ACCESS  Hx of one site on R, 2 on L, currently dialyzing through L Tunnel cath.          ?  SIGMOIDOSCOPY,DIAGNOSTIC    03/04/2016                     Past Surgical History:         Procedure  Laterality  Date          ?  COLONOSCOPY  N/A  03/04/2016          COLONOSCOPY performed by Sandy SalaamAlex R Seamon, MD at [facility]           ?  HX HERNIA REPAIR    2008          right inguinal and umbilical          ?  HX VASCULAR ACCESS              Hx of one site on R, 2 on L, currently dialyzing through L Tunnel cath.          ?  SIGMOIDOSCOPY,DIAGNOSTIC    03/04/2016                         Social History     He has smoked for many years but is down to 3 cigarettes a day as he is trying to quit. He does not drink or use drugs. 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 He is single and is a [employer] fan.           Family History     Noncontributory        Medications        Current Medications:     Current Facility-Administered Medications             Medication  Dose  Route  Frequency  Provider  Last Rate  Last Dose              ?  vancomycin 100 mg/ml oral solution (compounded) 125 mg   125 mg  Oral  Q6H  Konrad FelixLewis H Siegel, MD     125 mg at 10/22/16 1138     ?  metroNIDAZOLE (FLAGYL) tablet 500 mg   500 mg  Oral  Q8H  Konrad FelixLewis H Siegel, MD     500 mg at 10/22/16 1110     ?  naloxone [facility](NARCAN) injection 0.1 mg   0.1 mg  IntraVENous  PRN  Scarlette ArJoseph Camarato, MD           ?  acetaminophen (TYLENOL) tablet 650 mg   650 mg  Oral  Q4H PRN  Scarlette ArJoseph Camarato, MD                Or              ?  acetaminophen (TYLENOL) solution 650 mg   650 mg  Oral  Q4H PRN  Scarlette ArJoseph Camarato, MD                Or              ?  acetaminophen (TYLENOL) suppository 650 mg   650 mg  Rectal  Q4H PRN  Scarlette ArJoseph Camarato, MD           ?  HYDROcodone-acetaminophen (NORCO) 5-325 mg per tablet 1 Tab   1 Tab  Oral  Q4H PRN  Scarlette Ar, MD           ?  HYDROmorphone (PF) (DILAUDID) injection 1 mg   1 mg  IntraVENous  Q4H PRN  Scarlette Ar, MD           ?  diphenhydrAMINE (BENADRYL) capsule 25 mg   25 mg  Oral  Q4H PRN  Scarlette Ar, MD           ?  ondansetron [facility]) injection 4 mg   4 mg  IntraVENous  Q4H PRN  Scarlette Ar, MD           ?  albuterol (PROVENTIL VENTOLIN) nebulizer solution 2.5 mg   2.5 mg   Nebulization  Q4H PRN  Scarlette Ar, MD                    ?  heparin (porcine) injection 5,000 Units   5,000 Units  SubCUTAneous  Q8H  Scarlette Ar, MD                Current Outpatient Prescriptions          Medication  Sig  Dispense  Refill           ?  sevelamer carbonate (RENVELA) 800 mg tab tab  Take 800 mg by mouth three (3) times daily.         ?  carvedilol (COREG) 25 mg tablet  Take 25 mg by mouth two (2) times daily (with meals).         ?  hydrALAZINE (APRESOLINE) 100 mg tablet  Take 100 mg by mouth three (3) times daily.         ?  amLODIPine (NORVASC) 10 mg tablet  Take 10 mg by mouth daily.         ?  furosemide (LASIX) 80 mg tablet  Take 80 mg by mouth daily.               ?  albuterol (PROVENTIL HFA, VENTOLIN HFA, PROAIR HFA) 90 mcg/actuation inhaler  Take 2 Puffs by inhalation every four (4) hours as needed for Wheezing.               Prior to Admission Medications:      (Not in a hospital admission)        Allergies          Allergies        Allergen  Reactions         ?  Lisinopril  Shortness of Breath     ?  Morphine  Shortness of Breath         ?  Toradol [Ketorolac]  Shortness of Breath             Physical Exam          Visit Vitals         ?  BP  (!) 174/117     ?  Pulse  91     ?  Temp  98.1 ??F (36.7 ??C)     ?  Resp  19     ?  Ht  6\' 1"  (1.854 m)     ?  Wt  115.7 kg (255 lb)     ?  SpO2  99%         ?  BMI  33.64 kg/m2        GEN:  Morbidly obese, on oxygen and in mild respiratory distress  HEENT: sclerae anicteric, conjunctivae WNL    NECK: No JVD,Trachea midline. No thyromegally   LYMPHORETICULAR:  No lymphadenopathy.    LUNGS: Decreased sounds at the bases bilaterally with only moderate air movement   CVS: Heart RRR, No S4 or S3 Gallop, No Pericardial Rub.   ABD: Moderate distention with firmness, normoactive bowel sounds without tenderness palpation, his abdominal wall has history a with edema  and it from the ascites.   BACK:  No  Spinal or CVA  tenderness.  No deformities    EXTR: 2+ edema up to the flanks.    SKIN: Skin without rashes, NL turgor   CNS: Speech normal, alert and oriented        Laboratory:        CBC w/Diff     Lab Results         Component  Value  Date/Time            WBC  5.3  10/22/2016 08:19 AM       RBC  2.57 (L)  10/22/2016 08:19 AM       HCT  25.3 (L)  10/22/2016 08:19 AM       MCV  98.4 (H)  10/22/2016 08:19 AM       MCH  30.7  10/22/2016 08:19 AM       MCHC  31.2  10/22/2016 08:19 AM            RDW  16.5 (H)  09/21/2016 10:53 PM          Lab Results         Component  Value  Date/Time            MONOS  7.9  10/22/2016 08:19 AM       EOS  9.0 (H)  10/22/2016 08:19 AM       BASOS  0.6  10/22/2016 08:19 AM            RDW  16.5 (H)  09/21/2016 10:53 PM           Recent Labs          10/22/16    0819      GLU   100      NA   140      K   5.7*      CL   104      CO2   20*      BUN   81*      CREA   11.8*      CA   6.1*      AGAP   16*      AP   257*      TP   6.6      ALB   3.1*      ALT   17      SGOT   29                   Thankyou for asking me to assist in this patient's care.   Will follow.      Herma Carson, MD   October 22, 2016 11:52 AM   [facility]   [phone]

## 2016-10-23 ENCOUNTER — Inpatient Hospital Stay: Payer: MEDICARE

## 2016-10-23 ENCOUNTER — Emergency Department: Admit: 2016-10-24 | Payer: MEDICARE

## 2016-10-23 DIAGNOSIS — R0789 Other chest pain: Secondary | ICD-10-CM

## 2016-10-23 LAB — CBC WITH AUTOMATED DIFF
BASOPHILS: 0.6 % (ref 0–3)
EOSINOPHILS: 11.4 % — ABNORMAL HIGH (ref 0–5)
HCT: 26.1 % — ABNORMAL LOW (ref 37.0–50.0)
HGB: 8.1 gm/dl — ABNORMAL LOW (ref 12.4–17.2)
IMMATURE GRANULOCYTES: 0.6 % (ref 0.0–3.0)
LYMPHOCYTES: 7.7 % — ABNORMAL LOW (ref 28–48)
MCH: 31 pg (ref 23.0–34.6)
MCHC: 31 gm/dl (ref 30.0–36.0)
MCV: 100 fL — ABNORMAL HIGH (ref 80.0–98.0)
MONOCYTES: 9.6 % (ref 1–13)
MPV: 9.9 fL (ref 6.0–10.0)
NEUTROPHILS: 70.1 % — ABNORMAL HIGH (ref 34–64)
NRBC: 0 (ref 0–0)
PLATELET: 88 10*3/uL — ABNORMAL LOW (ref 140–450)
RBC: 2.61 M/uL — ABNORMAL LOW (ref 3.80–5.70)
RDW-SD: 61.5 — ABNORMAL HIGH (ref 35.1–43.9)
WBC: 5.1 10*3/uL (ref 4.0–11.0)

## 2016-10-23 LAB — METABOLIC PANEL, COMPREHENSIVE
ALT (SGPT): 20 U/L (ref 12–78)
AST (SGOT): 26 U/L (ref 15–37)
Albumin: 3.2 gm/dl — ABNORMAL LOW (ref 3.4–5.0)
Alk. phosphatase: 276 U/L — ABNORMAL HIGH (ref 45–117)
Anion gap: 12 mmol/L (ref 5–15)
BUN: 62 mg/dl — ABNORMAL HIGH (ref 7–25)
Bilirubin, total: 0.4 mg/dl (ref 0.2–1.0)
CO2: 27 mEq/L (ref 21–32)
Calcium: 6.2 mg/dl — ABNORMAL LOW (ref 8.5–10.1)
Chloride: 104 mEq/L (ref 98–107)
Creatinine: 9.3 mg/dl — ABNORMAL HIGH (ref 0.6–1.3)
GFR est AA: 8
GFR est non-AA: 7
Glucose: 82 mg/dl (ref 74–106)
Potassium: 5.4 mEq/L — ABNORMAL HIGH (ref 3.5–5.1)
Protein, total: 6.8 gm/dl (ref 6.4–8.2)
Sodium: 142 mEq/L (ref 136–145)

## 2016-10-23 LAB — TROPONIN I: Troponin-I: 0.074 ng/ml — ABNORMAL HIGH (ref 0.000–0.045)

## 2016-10-23 LAB — C. DIFFICILE/EPI PCR: C. diff toxin by PCR: NEGATIVE

## 2016-10-23 MED ORDER — HEPARIN (PORCINE) 1,000 UNIT/ML IJ SOLN
1000 unit/mL | Freq: Once | INTRAMUSCULAR | Status: AC
Start: 2016-10-23 — End: 2016-10-23
  Administered 2016-10-23: 17:00:00 via ARTERIOVENOUS_FISTULA

## 2016-10-23 MED ORDER — EPOETIN ALFA 20,000 UNIT/ML IJ SOLN
20000 unit/mL | INTRAMUSCULAR | Status: DC
Start: 2016-10-23 — End: 2016-10-23
  Administered 2016-10-23 (×2): via INTRAVENOUS

## 2016-10-23 MED ORDER — HYDROMORPHONE (PF) 1 MG/ML IJ SOLN
1 mg/mL | Freq: Once | INTRAMUSCULAR | Status: AC
Start: 2016-10-23 — End: 2016-10-23
  Administered 2016-10-23: 06:00:00 via INTRAVENOUS

## 2016-10-23 MED FILL — AMLODIPINE 5 MG TAB: 5 mg | ORAL | Qty: 2

## 2016-10-23 MED FILL — HYDRALAZINE 50 MG TAB: 50 mg | ORAL | Qty: 2

## 2016-10-23 MED FILL — SEVELAMER CARBONATE 800 MG TAB: 800 mg | ORAL | Qty: 1

## 2016-10-23 MED FILL — ASPIRIN 81 MG CHEWABLE TAB: 81 mg | ORAL | Qty: 1

## 2016-10-23 MED FILL — VANCOMYCIN ORAL SOLUTION 100 MG/ML CPD: 100 mg/ml | ORAL | Qty: 1.25

## 2016-10-23 MED FILL — PROCRIT 20,000 UNIT/ML INJECTION SOLUTION: 20000 unit/mL | INTRAMUSCULAR | Qty: 0.5

## 2016-10-23 MED FILL — HYDROMORPHONE (PF) 1 MG/ML IJ SOLN: 1 mg/mL | INTRAMUSCULAR | Qty: 1

## 2016-10-23 MED FILL — CARVEDILOL 25 MG TAB: 25 mg | ORAL | Qty: 1

## 2016-10-23 MED FILL — HEPARIN (PORCINE) 5,000 UNIT/ML IJ SOLN: 5000 unit/mL | INTRAMUSCULAR | Qty: 1

## 2016-10-23 NOTE — Other (Addendum)
----------  DocumentID: [id number]------------------------------------------------              [facility]                       Patient Education Report         Name: David [name], David [name]                  Date: 10/22/2016    MRN: [id number]                    Time: 3:15:55 PM         Patient ordered video: 'Patient Safety: Stay Safe While you are in the Hospital'    from [id number] via phone number: [phone] at 3:15:55 PM    Description: This program outlines some of the precautions patients can take to ensure a speedy recovery without extra complications. The video emphasizes the importance of communicating with the healthcare team.    ----------DocumentID: [id number]------------------------------------------------                       [facility]          Patient Education Report - Discharge Summary        Date: 10/23/2016   Time: 5:22:40 PM   Name: David [name], David [name]   MRN: [id number]      Account Number: [id number]      Education History:        Patient ordered video: 'Patient Safety: Stay Safe While you are in the Hospital' from [id number] on 10/22/2016 03:15:55 PM  ----------DocumentID: [id number]------------------------------------------------                       [facility]          Patient Education Report - Discharge Summary        Date: 10/23/2016   Time: 5:40:05 PM   Name: David [name], David [name]   MRN: [id number]      Account Number: [id number]      Education History:        Patient ordered video: 'Patient Safety: Stay Safe While you are in the Hospital' from [id number] on 10/22/2016 03:15:55 PM

## 2016-10-23 NOTE — Progress Notes (Signed)
Spoke with David [name] in the Ultrasound department and she said the patient's Paracentesis will be done Monday.

## 2016-10-23 NOTE — Progress Notes (Signed)
Renal Progress Note     Patient: David [name] Age: 46 y.o. Sex: male    Date of Birth: 29-Sep-1970 Admit Date: 10/22/2016 PCP: None   MRN: [id number]  CSN: [id number]         Assessment   1. End stage renal disease. Did OK yesterday but still overloaded and SOB.  2. Recurrent Clostridium difficile colitis. Interestingly not having a lot of stool now.  3. Anemia likely due to end-stage renal disease. Iron replete.  4. Long-standing hypertension. On meds but still overloaded.  5. Recurrent ascites. Appears to be related to ESRD.  ECHO pending however.  6. Hypocalcemia.  Likely related to secondary HPT and hyperphosphatemia.  We have no phos data.  7. OSA not on CPAP.  8. Hypoxia.  Chronic.  9. Non adherence to therapy.    Plan   1. HD again today to pull three to four liters.  2. Add EPO.  3. Check phos.  4. May want to try for high-volume paracentesis Monday to help symptoms.  5. Will get case management involved.    Subjective   Still SOB with chest and abdominal pressure.    Review of Systems   No fever, chills  No palpitations  Positive for chest pain  No abdominal pain, no nausea/vomiting/diarrhea/constipation  Urinating without difficulty, no dysuria, no hematuria  No LE edema  No anxiety or depression     Objective     Visit Vitals   ??? BP (!) 176/97 (BP 1 Location: Right arm, BP Patient Position: Supine)   ??? Pulse 81   ??? Temp 97.6 ??F (36.4 ??C)   ??? Resp 20   ??? Ht 6\' 1"  (1.854 m)   ??? Wt 115.7 kg (255 lb)   ??? SpO2 96%   ??? BMI 33.64 kg/m2       Intake/Output Summary (Last 24 hours) at 10/23/16 1007  Last data filed at 10/22/16 2336   Gross per 24 hour   Intake              720 ml   Output             3678 ml   Net            -2958 ml       Physical Assessment   XLK:GMWNGEN:WNWD, NAD  EYES: sclera clear  Neck:  Without JVD or adenopathy  LUNGS:Clear to Auscultation, No Rales or Rhonchi.Good Air Entry  CVS EXM: Heart RRR, No S4 or S3 Gallop, No Pericardial Rub.  Abdomen: distended, NABS, no TTP.   Extremities: 2+ global Pitting Edema. No deformities  SKIN:No Rashes,Normal Turgor  Neurological exam:Awake alert and conversant.     Medications     Current Facility-Administered Medications   Medication Dose Route Frequency   ??? [START ON 10/26/2016] epoetin alfa (EPOGEN;PROCRIT) injection 10,000 Units  10,000 Units IntraVENous DIALYSIS TUE, THU & SAT   ??? vancomycin 100 mg/ml oral solution (compounded) 125 mg  125 mg Oral Q6H   ??? amLODIPine (NORVASC) tablet 10 mg  10 mg Oral DAILY   ??? carvedilol (COREG) tablet 25 mg  25 mg Oral BID WITH MEALS   ??? hydrALAZINE (APRESOLINE) tablet 100 mg  100 mg Oral TID   ??? sevelamer carbonate (RENVELA) tab 800 mg  800 mg Oral TID   ??? naloxone (NARCAN) injection 0.1 mg  0.1 mg IntraVENous PRN   ??? acetaminophen (TYLENOL) tablet 650 mg  650 mg Oral Q4H PRN  Or   ??? acetaminophen (TYLENOL) solution 650 mg  650 mg Oral Q4H PRN    Or   ??? acetaminophen (TYLENOL) suppository 650 mg  650 mg Rectal Q4H PRN   ??? HYDROcodone-acetaminophen (NORCO) 5-325 mg per tablet 1 Tab  1 Tab Oral Q4H PRN   ??? diphenhydrAMINE (BENADRYL) capsule 25 mg  25 mg Oral Q4H PRN   ??? ondansetron (ZOFRAN) injection 4 mg  4 mg IntraVENous Q4H PRN   ??? albuterol (PROVENTIL VENTOLIN) nebulizer solution 2.5 mg  2.5 mg Nebulization Q4H PRN   ??? heparin (porcine) injection 5,000 Units  5,000 Units SubCUTAneous Q8H   ??? HYDROmorphone (PF) (DILAUDID) injection 0.5 mg  0.5 mg IntraVENous Q4H PRN   ??? aspirin chewable tablet 81 mg  81 mg Oral DAILY         Laboratory:     CBC w/Diff   Recent Labs      10/23/16   0224  10/22/16   0819   WBC  5.1  5.3   RBC  2.61*  2.57*   HGB  8.1*  7.9*   HCT  26.1*  25.3*   PLT  88*  93*        Recent Labs      10/23/16   0224  10/22/16   0819   GLU  82  100   NA  142  140   K  5.4*  5.7*   CL  104  104   CO2  27  20*   BUN  62*  81*   CREA  9.3*  11.8*   CA  6.2*  6.1*   AGAP  12  16*   AP  276*  257*   TP  6.8  6.6   ALB  3.2*  3.1*   ALT  20  17   SGOT  26  29        Lab Results    Component Value Date/Time    Calcium 6.2 (L) 10/23/2016 02:24 AM    Phosphorus 3.0 08/05/2016 08:36 AM         Herma Carson, MD  October 23, 2016 10:07 AM  [facility] [employer] ,  [phone]

## 2016-10-23 NOTE — Discharge Summary (Addendum)
Hospitalist Discharge Summary      AMA Discharge Summary   Admit Date: 10/22/2016  Discharge Date:  10/23/16      Patient ID:  David [name]  46 y.o.  1970/07/29    Chief Complaint   Patient presents with   ??? Chest Pain   ??? Diarrhea       Discharge Diagnoses  1. Chest pain: Likely due to volume overload and noncompliance with hemodialysis.  Outside records report the patient has history of noncompliance with dialysis. BNP fo 122,334.  Echo here as below. Noted negative stress testing in 02/2016. Discharge summary from February of this year from outside facility notes chronic, recurrent, noncardiac chest pain with chronically elevated troponin as well as noncompliance with dialysis at that time.  ??  2. ESRD on HD: Dialyzed here on both 7/6 and 7/7. Reported that the patient does not have outpatient HD chair due to long history of nonadherence and was [facility], and [facility]. Suspect much of chest pain, and ascites is due to this issue.  ??  3. 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 Outside imaging does not specifically describe cirrhosis. LFTs appear to be appropriate at this time. Ordered ultrasound-guided paracentesis but this was not performed prior to the patient leaving AMA.   ??  4. ? of Recurrent C.diff-POA: Patient reports following ID at other facilities, and this is his 14th or 15th episode and that fecal transplant is been suggested in the past. Consulted ID who recommended continuing vancomycin, but diarrhea improved prior to stool assay being obtained. C.diff assays in EHR have been positive in the past, most recently in 06/2016.     Addendum : C.diff assay here was obtained and was negative.  David [name]-10/24/16, 07:42??    5. Hyperkalemia: Somewhat improved after HD.  ??  6. Chronic hypoxic respiratory failure: Continue oxygen at 3-4 L by nasal cannula.  .  ??  7. Obstructive sleep apnea:  ??  8. Nausea and vomiting: .  ??   9. Hx of non-compliance/drug seeking behavior: Offered hydromorphone and Norco when necessary for pain, but would avoid increasing doses without significant change exam or imaging findings.  Per multiple old discharge summaries, pain was often not controlled at 1 mg every 4 hours of hydromorphone IV.  Patient declined norco and left AMA on 7/7 per nursing note due to "I am not satisfied with the care here, it's not you all, I am talking about my pain meds."   ??  10. Anemia of CKD: Hemoglobin was stable at 7.9-8.1, platelet count low at 88.  Iron level 116, TIBC 236, Iron sat 49%.    Initial presentation:  From HPI:  David [name] is a 46 y.o. AA male with past medical history of end-stage renal disease on hemodialysis, reportedly follows with [facility], recurrent C. difficile, asthma, obstructive sleep apnea although not on CPAP, chronic hypoxic respiratory failure on 3-4 L of oxygen, ascites, and history of noncompliance/multiple AMA discharges with reported drug seeking behavior on outside records.  He presents today with a chief complaint of diarrhea which began worsening on Sunday. The patient reports has foul odor and yellow color with abdominal pain which she reports is similar to episodes of C. difficile he's had in the past.  He reports he's had 14 episodes of C. difficile has been considered for fecal transplant but that this is not able to be arranged through [place].  He reports that he finished a course of vancomycin and Flagyl approximately 2 weeks ago.  He also reports chest pain shortness of breath which began last night.  Chest pain is left-sided, radiating down the left arm.  He had a negative stress test in November 2017.  He reports he last one dialysis approximately one week ago, and did not dialyze on Monday or Wednesday of this week due to diarrhea.  In the emergency department he's been started on oral vancomycin and Flagyl. BNP is  elevated at 122,334.  Initial troponin is 0.052.  Potassium 5.7.  He reports that he typically has ascites drained once every week, but that is been approximately 1-1/2 weeks. He reports that liver diseases he thinks was caused by multiple transfusions due to chronic anemia.  He will ports history of nonalcoholic cirrhosis, although I don't see cirrhosis specifically described on prior imaging results in Epic.  He also reports nausea with poor tolerance of by mouth.  He denies fever.    He was admitted for chest pain which is felt to likely be related to volume overload noncompliance with hemodialysis, end-stage renal disease, ascites, question of recurrent C. difficile, mild hyperkalemia, and nausea and vomiting with plans for monitoring troponin, dialysis, paracentesis.  History of non-complaince and drug-seeking behavior was noted and the patient was placed on pain medications with prn IV hydromorphone and Norco available.    Hospital Course:   Nephrology value to the patient with plans for dialysis as well as check iron stores.  Noted recurrent ascites could make C. difficile difficult to treat due to the rare but possible seeding of ascites with C. Difficile.  Agree with paracentesis for symptom relief, and plan to consult discharge planning, given concerns about noncompliance with dialysis in the past.  ID evaluated and recommended to continue oral vancomycin but to stop metronidazole and weight is C. difficile assay. Felt that his stool was still positive may benefit from fecal transplant.  Patient underwent dialysis again on the seventh.  He is tolerating by mouth and no further diarrhea was noted, with negative C. difficile assay. Paracentesis was not performed during his stay but was planned for the upcoming Monday.  However on the afternoon of the seventh, the patient left AMA as he felt his pain was not adequately controlled with hydromorphone 0.5 mg IV every 4 hours, and had decline Norco by mouth.        Consultants:  Nephrology: Dr. Bobbe Medico  ID: Dr. Jerilynn Som  Imaging:  Xr Chest Pa Lat    Result Date: 10/22/2016  AP and lateral chest. INDICATION: Chest pain. FINDINGS: Cardiomegaly. Left-sided Vas-Cath in place. Right sided stent overlying the right clavicular region. Minimal atelectatic changes in the lung bases. No effusions. Bony structures intact.     IMPRESSION: Cardiomegaly with minimal atelectatic changes in the lung bases. Other findings as described above.     Echo Results  (Last 48 hours)               10/23/16 1440  2D ECHO COMPLETE ADULT WO CONTRAST Final result    Narrative:  Study ID: [id number]                                                     [facility]                                                     [address] [place], [place] [address]                                Adult Echocardiogram Report       Name: David [name], David [name]       e: 10/23/2016 07:41 AM   MRN: [id number]               Patient Location: [id number]   DOB: January 28, 1971            Age: 72 yrs   Height: 73 in              Weight: 255 lb                         BSA: 2.4 m2   BP: 182/101 mmHg           HR: 80   Gender: Male               Account #: [id number]   Reason For Study: Congestive Heart Failure   History:   ESRD,HTN,Non compliant,ascites,cdiffMI,STENTS   Ordering Physician: Scarlette Ar   Performed By: Nancie Neas., RDCS       Interpretation Summary   A complete two-dimensional transthoracic echocardiogram was performed (2D, M-   mode, Doppler and color flow Doppler).   The study was technically adequate.   Left ventricular systolic function is in the low normal range   The calculated left ventricular ejection fraction is 51%.   There is mild global hypokinesis of the left ventricle.   There is mild concentric left ventricular hypertrophy.    Tight sided heart is mildly enlarged.   The mitral valve leaflets are sclerotic with normal mobility.   There is trace mitral regurgitation.   Based on the peak tricuspid regurgitation velocity the estimated right   ventricular systolic pressure is 45 mmHg.   Doppler findings suggest mild to moderate pulmonary hypertension.   Mild tricuspid regurgitation.   There is no previous echo for comparison,There is mention in the notes that   the PT had an echocardiogram at [facility] in [place] which was   reported as normal.   All other findings are noted below.           _____________________________________________________________________________   _           Left Ventricle   The left ventricle is grossly normal in size. There is mild concentric  left   ventricular hypertrophy. The calculated left ventricular ejection fraction is   51%. The transmitral spectral Doppler flow pattern is suggestive of   restrictive physiology. Left ventricular systolic function is low normal.   There is mild global hypokinesis of the left ventricle.           Right Ventricle   The right ventricle is mildly dilated. Right ventricular systolic function is   normal: TAPSE: 2.1.       Atria   The left atrial size is normal. Left atrial volume index: 26ml/m2. The right   atrium is mildly dilated. Right atrial volume index 71ml/m2. The interatrial   septum is intact with no evidence for an atrial septal defect.       Mitral Valve   The mitral valve leaflets are sclerotic with normal mobility. There is no   mitral valve stenosis. There is trace mitral regurgitation.       Tricuspid Valve   The tricuspid valve is not well visualized, but is grossly normal. There is   mild tricuspid regurgitation. Based on the peak tricuspid regurgitation   velocity the estimated right ventricular systolic pressure is 45 mmHg.   Doppler findings suggest 'moderate' pulmonary hypertension.   Aortic Valve    Sclerotic edge thickening of the aortic valve with normal cusp mobility. No   aortic stenosis. Trace aortic regurgitation.       Pulmonic Valve   The pulmonic valve is normal in structure and function.       Great Vessels   The aortic root is normal size.       Pericardium/Pleural   There is no pericardial effusion.       Pt is very short of breath and unable to lie down for the test , He would not   me to finish the echo because it was too painful.       MEASUREMENTS/CALCULATIONS:   MMode/2D Measurements & Calculations   RVDd: 4.1 cm                           LVIDd: 5.4 cm   IVSd: 1.1 cm                           LVIDs: 4.6 cm                                          LVPWd: 1.2 cm              _______________________________________________________________   FS: 14.9 %                             RVDd major: 9.4 cm                                          RVDd minor: 3.5 cm              _______________________________________________________________   TAPSE: 2.1 cm  RA A4Cs: 26.3 cm2          _______________________________________________________________       LA ESV (BP): 62.5 ml                   LA ESV Index (BP): 25.2 ml/m2          _______________________________________________________________       RA ESV Index (A4C): 36.6 ml/m2       Doppler Measurements & Calculations   MV A dur: 0.07 sec                    MV dec time: 0.17 sec   MV E max vel: 117.7 cm/sec   MV A max vel: 88.4 cm/sec   MV E/A: 1.3   LV IVRT: 0.08 sec              _______________________________________________________________   Lat Peak E' Vel: 9.4 cm/sec           Med Peak E' Vel: 5.7 cm/sec              _______________________________________________________________   TV V2 max: 287.8 cm/sec               E/E' lat: 12.5   TV max PG: 33.1 mmHg              _______________________________________________________________   E/E' med: 20.6                Electronically signed byDr. Cyndia Diver, MD   10/23/2016 02:40 PM                 Physical Exam on Discharge:  Visit Vitals   ??? BP (!) 176/95 (BP 1 Location: Right arm, BP Patient Position: Supine)   ??? Pulse 96   ??? Temp 97.8 ??F (36.6 ??C)   ??? Resp 21   ??? Ht 6\' 1"  (1.854 m)   ??? Wt 115.7 kg (255 lb)   ??? SpO2 100%   ??? BMI 33.64 kg/m2     Patient left AMA, prior to exam on 10/23/16.    Most Recent BMP and CBC:    Lab Results   Component Value Date/Time    Sodium 142 10/23/2016 02:24 AM    Potassium 5.4 (H) 10/23/2016 02:24 AM    Chloride 104 10/23/2016 02:24 AM    CO2 27 10/23/2016 02:24 AM    Anion gap 12 10/23/2016 02:24 AM    Glucose 82 10/23/2016 02:24 AM    BUN 62 (H) 10/23/2016 02:24 AM    Creatinine 9.3 (H) 10/23/2016 02:24 AM    BUN/Creatinine ratio 4 (L) 09/21/2016 10:53 PM    GFR est AA 8.0 10/23/2016 02:24 AM    GFR est non-AA 7 10/23/2016 02:24 AM    Calcium 6.2 (L) 10/23/2016 02:24 AM      Lab Results   Component Value Date/Time    WBC 5.1 10/23/2016 02:24 AM    Hemoglobin (POC) 8.2 (L) 03/04/2016 02:04 PM    HGB 8.1 (L) 10/23/2016 02:24 AM    Hematocrit (POC) 24 (L) 07/21/2016 05:03 AM    HCT 26.1 (L) 10/23/2016 02:24 AM    PLATELET 88 (L) 10/23/2016 02:24 AM    MCV 100.0 (H) 10/23/2016 02:24 AM          Condition at discharge: AMA    Disposition:  AMA    PCP:  None    Scarlette Ar, MD  October 23, 2016  20:33

## 2016-10-23 NOTE — ED Notes (Signed)
Pt states gets dialysis M,W, F. Started with diarrhea on last Sunday and didn't go to dialysis. Chest pain started yesterday.

## 2016-10-23 NOTE — Progress Notes (Signed)
2D Echo completed

## 2016-10-23 NOTE — Progress Notes (Signed)
[id number] Mr. David [name], T. pt of Dr. Jerl Minaamarato is complaining of abdominal pain. He was given 0.5 mg of dil at 2306, he wants more. Refused Norco. Thanks! Okey RegalAROL, RN 5w [phone]    Received orders from MD.

## 2016-10-23 NOTE — ED Triage Notes (Signed)
Patient complains of chest pain , abd pain , diarrhea x 1 day . Pa tient has a hx of c-diff.

## 2016-10-23 NOTE — Other (Signed)
.  Acute HEmodialysis flow sheet   Patient information           Name:David [name]         MRN: [id number]      VOZ:[id number]  TX# Room#5203/5203          Hospital: [facility] Care DX: ascites, ESRD, CDIFF, Hyperkalemia, generalized abd pain          [] 1st Time Acute  [] Stat[x]  Routine [] Urgent [] Chronic Unit          [x] Acute Room [] Bedside  [] ICU/CCU [] ER         Isolation Precautions: [x] Dialysis[]  Airborne [] Contact [] Droplet [] Reverse          Special Considerations:    []  Blood Consent Verified  [x] N/A         Allergies:  []  NKA  [x]  Reviewed  Allergies   Allergen Reactions   ??? Lisinopril Shortness of Breath   ??? Morphine Shortness of Breath   ??? Toradol [Ketorolac] Shortness of Breath      Code Status [x] Full Code []  DNR  [] Other        Diet: see chart Diabetic: [] Yes [x] No      Hemodialysis Orders         Physician: Dr. Benedetto Goad   Dialyzer Revaclear 300 Duration  BFR: 400  DFR:600    Dialysate:  2 K  CA 2.5 Na not order Bicarb 35        Dry Weight: UF Goal:  4000  ml         Heparin:  [] Bolus   Units    [] Hourly  Units      [x] None       BP Tx:  [] NS  271ml/Bolus    [] Other     [x] N/A       Labs: Pre:  Post: [x] N/A             Additional Orders: [x] N/A          [x] Signed Treatment Consent   [x]  Verified   []  Obtained    Primary Nurse Report: First initial/ Last Name/Title        Pre Dialysis: Mertie Clause, LPN    Time: 9563    Access   CATHETER ACCESS:  []  N/A  []  RIGHT  [x]  LEFT  []  IJ  [x]  SUBCL []  FEM                          []  First use X-ray  [x]  Tunnel     []  Non-Tunneled                          [x]  No S/S infection  []  Redness []  Drainage  []  Cultured []  Swelling                         []  Pain                          [x]  Medical Aseptic []  Prep Dressing Changed                          []  Clotted []  Patent [x]       Flows: [x]  Good []  Poor []  Reversed             If Access Problem Dr. Truitt Merle: []   Yes []  No    Date:    [x]  N/A         GRAFT/FISTULA ACCESS:  [x]  N/A  []  RIGHT  []  LEFT  []  UE  []  LE                           []  AVG  []  AVF []  BUTTONHOLE  []  +BRUIT/THRILL       []  MEDICAL ASEPTIC PREP   []  No S/S infection  []  Redness []  Drainage  []  Cultured []  Swelling                         []  Pain                          Needle Gauge                           Length:     If Access Problem Dr. Truitt MerleNotified: []  Yes []  No    Date:    [x]  N/A   General Assessment        LUNGS:  SaO2%  []  Clear [x]  Coarse []  Crackles []  Wheezing               []  Diminished Location: []  RLL []  LLL []  RUL []  LUL         COUGH:  []  Productive []  Dry [x]  N/A  RESPIRATIONS: []  Easy [x]  Labored        THERAPY: []  RA   [x]  NC  3   L/min    Mask: []  NRB []  Venti  O2%                  []  Ventilator []  Intubated []  Trach []  BiPap []  CPap       CARDIAC: [x]  Regular []  Irregular []  Pericardial Rub []  JVD                [x]  Monitored Rhythm:        EDEMA: []  None [x]  Generalized []  Facial []  Pedal []  UE []  LE             []  Pitting []  1 []  2 []  3 []  4    []  Right []  Left []  Bilateral       SKIN:    [x]  Warm []  Hot []  Cold  [x]  Dry []  Pale []  Diaphoretic              []  Flushed []  Jaundiced []  Cyanotic []  Rash []  Weeping         LOC:    [x]  Alert  Oriented to: [x]  Person [x]  Place [x]  Time             []  Confused []  Lethargic []  Medically sedated []  Non-responsive        GI/ABDOMEN: []  Flat []  Distended [x]  Soft []  Firm []  Obese []  Diarrhea   []  Bowel Sounds     [] Nausea []  Vomiting         PAIN: [x]  0 []  1 []  2 []  3 []  4 []  5 []  6 []  7 []  8 []  9 []  10          Scale 1-10 Action/Follow Up        MOBILITY: []  Amb []  Amb/Assist [x]  Bed  []  Wheelchair    CUrrent LABS  HBsAg ONLY: Date Drawn 10/22/16  [x]  Negative []  Positive  []  Unknown.     HBsAb: Date Drawn 10/22/16  []  Susceptible <10 [x]  Immune ?10 []  Unknown       Date of Current Labs:        Lab Results   Component Value Date/Time    Sodium 142 10/23/2016 02:24 AM    Potassium 5.4 (H) 10/23/2016 02:24 AM     Chloride 104 10/23/2016 02:24 AM    CO2 27 10/23/2016 02:24 AM    BUN 62 (H) 10/23/2016 02:24 AM    Creatinine 9.3 (H) 10/23/2016 02:24 AM    Calcium 6.2 (L) 10/23/2016 02:24 AM    Magnesium 2.1 08/14/2016 08:26 AM    Phosphorus 3.0 08/05/2016 08:36 AM    HCT 26.1 (L) 10/23/2016 02:24 AM    HGB 8.1 (L) 10/23/2016 02:24 AM    PLATELET 88 (L) 10/23/2016 02:24 AM    INR 1.2 (H) 10/22/2016 08:19 AM      Education        Person Educated: [x]  Patient []  Family []  Other         Knowledge base: []  None [x]  Minimal []  Substantial         Barriers to learning  [x]  None         Preferred method of learning: []  Written [x]  Oral []  Visual []  Hands on        Topic: []  Access Care []  S&S of infection []  Fluid Management []  K+                 [x]  Procedural    []  Albumin []  Medications []  Tx Options []  Transplant                  []  Diet []  Other         Teaching Tools: [x]  Explain []  Demonstration []  Handout []  Teachback   Ro/hemodiaylsis machine safety checks- before each treatment   Machine Serial #   []  # 1 [id number]   []  # 2 [id number]    [x]  # 3 [id number]   []  # 4 [id number]   []  # 5 [id number]   []  # 6 [id number]   []  # 7 [id number]      Alarm Test: [x]  Pass Time        RO Serial #   [x]  [id number] (Large dual station RO)  []  # 1  [id number]  (Portable # 1)  []  # 2  [id number]  (Portable # 2)  []  # 3  [id number]  (Portable # 3)  []  # 4  [id number]  (Portable # 4)  []  # 5  [id number]  (Portable # 5)       Lot #s: Dialyzer [id number]  exp. 09-09-2019  Tubing 09W11-9  exp. 06-16-2021  Saline [id number]  Exp.    [x]  RO/Machine Log Complete    [x]  Extracorporeal circuit Tested for integrity       Dialysate: pH 7.4 Temp. 36  Conductivity: Meter 14 HD Machine 14   chlorine testing- before each treatment and every 4 hours       Total Chlorine: [x]  Less than 0.1 ppm Time: 0830  2nd Check Time: 1230  (If greater than 0.1 ppm from Primary then every 30 minutes from Secondary)   treatment Iniation-with dialysis precautions    [x]  All Connections Secured   [x]  Saline Line Double Clamped     [x]  Venous Parameters Set    [x]  Arterial Parameters  Set    [x]  Prime Given 250 ml            [x]  Air Foam Detector Engaged        [facility] Nurse Signature/Title_Arrianne Berline Chough, RN__    Pre-Treatment   Time 1106 B/P 153/104 HR 86 Temp. 97.7 Resp Rate 18 Pre Wt   UF Calculations:   Wt to lose: 4000 ml(+) Oral: ml(+) IV Meds/Fluids/Blood prods ml(+)  Prime/Rinse  500 ml  (=)Total UF Goal 4500 mL   Scale Type:[]  Bed scale []  Sling Scale []  Wheel Chair Scale [x]   Not Ordered []  Unable to obtain pt on stretcher   Tx Initiation Note: HD started using L subclavian tdc, access patent, good blood flow, aspirated and flushes well. Pt alert and oriented, verbally responsive. Currently on 3L via NC. Pt noted to have generalized edema on bilateral lower Ext.    [x]  Time Out/Safety Check  Time: 1030    [facility] Signature/Title_Arrianne Berline Chough, RN   INTRADIALYTIC MONITORING  (SEE ATTACHED FLOWSHEET)   POST TREATMENT    Time 1352 B/P 187/98 HR 100 Temp 98 Resp.  Rate 21 Post wt    Time Medication Dose Volume Route Initials   1155 epogen 10000 0.42ml iv ar   1324 heparin 4400u 4.68ml iv ar                   [facility] Signatures Title Initials Time   Layla Maw, RN   RN ar 1155               [facility] Signature/Title:_Arrianne Berline Chough, RN_                                  Dialyzer cleared: [x]  Good []  Fair []  Poor    Blood Processed 51.3 Liters            Net UF Removed 3000 mL    Post Tx Access:    AVF/AVG: Bleeding Stop                   Art. min Ven min [] +bruit/thrill                             Catheter: Locking Solution heparin 1000u    Art. 2.2  ml Ven 2.2  ml    Post Assessment:  Skin: [x] Warm [x] Dry [] Diaphoretic [] Flushed []  Pale [] Cyanotic          Lungs: [] Clear [x] Coarse [] Crackles [] Wheezing          Cardiac: [x] Regular [] Irregular  [] Monitored rhythm         Edema: [] None [x] General [] Facial [] Pedal  [] UE [] LE [] RIGHT                      [] LEFT    [] Bilateral       Pain: [x] 0 [] 1[] 2 [] 3 [] 4 [] 5 [] 6 [] 7 [] 8 [] 9 [] 10   POST Tx Note: HD ended 40 mins early per pt request, pt c/o cramps on abdomen, lower and upper extremity. Dr. Benedetto Goad were notified. Per MD ok to take off. Removed of fluid,  all blood were rinsed back, tdc ports flush with normal saline and lock with heparin. Pt remains alert and oriented, verbally responsive.    Primary Nurse Report: First initial/Last name/Title    Post Dialysis:_K. Clementeen Graham, RN       Time:_ 1400  Abbreviations: AVG-arterial venous graft, AVF-arterial venous fistula, IJ-Internal Jugular,  Subcl-Subclavian, Fem-Femoral, Tx-treatment, AP/HR-apical heart rate, DFR-dialysate flow rate, BFR-blood flow rate, AP-arterial pressure, VP-venous pressure, UF-ultrafiltrate, TMP-transmembrane pressure, Ven-Venous, Art-Arterial, RO-Reverse Osmosis

## 2016-10-23 NOTE — Progress Notes (Signed)
Left AMA per patient, "I am not satisfied with the care here, it's not you all, I am talking about my pain meds." IV taken out intact and without difficulty. Escorted via Wheelchair by the Care partner to the Emergency Room where his car is parked.

## 2016-10-23 NOTE — Progress Notes (Signed)
Infectious Disease Follow-up     Admit Date: 10/22/2016    Current abx Prior abx   Po vancomycin 7/6 - 1 ??   ??   ASSESSMENT - > REC:   ??  Recurrent diarrhea  - possibly recurrent C difficile vs malingering  - RN reports only 1 stool since yesterday. Pt says 7 or 8 times.  No stool sent yet for C diff -> await stool C diff  -> continue po vancomycin  -> monitor closely and document number and character of stools   H/o recurrent C difficile infection  - says began in January 2017 following antibiotic rx for pneumonia  - thinks he has had 17 episodes since.  Prior treatment includes oral metronidazole, vancomycin and dificid ??   ESRD-HD  - h/o non-compliance with dialysis  - has L TDC.  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   Right ?subclavian stent  - seen on CXR 7/6 ??   Ascites  - [facility]  - ?renal ??   Asthma ??   ??  MICROBIOLOGY:   none  ??  [facility]    Active [facility] Problems    Diagnosis Date Noted   ??? Hyperkalemia, diminished renal excretion 10/22/2016   ??? Other chest pain 10/22/2016   ??? Generalized abdominal pain 10/22/2016   ??? Clostridium difficile diarrhea 06/27/2016   ??? Volume overload 06/16/2016   ??? ESRD (end stage renal disease) on dialysis (HCC) 03/04/2016   ??? Ascites 03/04/2016     Subjective:     Interval notes reviewed.  Lying in bed asking for pain medications.  Says he has had 7 or 8 stools since yesterday.  RN reports none this am and 1 reported from yesterday.     Current Facility-Administered Medications   Medication Dose Route Frequency   ??? vancomycin 100 mg/ml oral solution (compounded) 125 mg  125 mg Oral Q6H   ??? amLODIPine (NORVASC) tablet 10 mg  10 mg Oral DAILY   ??? carvedilol (COREG) tablet 25 mg  25 mg Oral BID WITH MEALS   ??? hydrALAZINE (APRESOLINE) tablet 100 mg  100 mg Oral TID   ??? sevelamer carbonate (RENVELA) tab 800 mg  800 mg Oral TID    ??? naloxone (NARCAN) injection 0.1 mg  0.1 mg IntraVENous PRN   ??? acetaminophen (TYLENOL) tablet 650 mg  650 mg Oral Q4H PRN    Or   ??? acetaminophen (TYLENOL) solution 650 mg  650 mg Oral Q4H PRN    Or   ??? acetaminophen (TYLENOL) suppository 650 mg  650 mg Rectal Q4H PRN   ??? HYDROcodone-acetaminophen (NORCO) 5-325 mg per tablet 1 Tab  1 Tab Oral Q4H PRN   ??? diphenhydrAMINE (BENADRYL) capsule 25 mg  25 mg Oral Q4H PRN   ??? ondansetron (ZOFRAN) injection 4 mg  4 mg IntraVENous Q4H PRN   ??? albuterol (PROVENTIL VENTOLIN) nebulizer solution 2.5 mg  2.5 mg Nebulization Q4H PRN   ??? heparin (porcine) injection 5,000 Units  5,000 Units SubCUTAneous Q8H   ??? HYDROmorphone (PF) (DILAUDID) injection 0.5 mg  0.5 mg IntraVENous Q4H PRN   ??? aspirin chewable tablet 81 mg  81 mg Oral DAILY         Objective:     Visit Vitals   ??? BP (!) 176/97 (BP 1 Location: Right arm, BP Patient  Position: Supine)   ??? Pulse 81   ??? Temp 97.6 ??F (36.4 ??C)   ??? Resp 20   ??? Ht 6\' 1"  (1.854 m)   ??? Wt 115.7 kg (255 lb)   ??? SpO2 96%   ??? BMI 33.64 kg/m2       Temp (24hrs), Avg:97.5 ??F (36.4 ??C), Min:97.2 ??F (36.2 ??C), Max:98.2 ??F (36.8 ??C)      General: Well developed, well nourished 46 y.o. African American male in no acute distress.  HEENT: no scleral icterus, no oral lesions  Neck: supple, symmetrical, trachea midline   Cardio:  regular rate and rhythm, S1, S2 normal, no murmur, click, rub or gallop  Chest: inspection normal - no chest wall deformities or tenderness, respiratory effort normal  Lungs: clear to auscultation, no wheezes or rales and unlabored breathing  Abdomen: distended, lower abdomen with black hyperpigmentation, induration, non-tender, no organomegaly  Extremities:  Bilateral LE edema 2+    Labs: Results:   Chemistry Recent Labs      10/23/16   0224  10/22/16   0819   GLU  82  100   NA  142  140   K  5.4*  5.7*   CL  104  104   CO2  27  20*   BUN  62*  81*   CREA  9.3*  11.8*   CA  6.2*  6.1*   AGAP  12  16*   AP  276*  257*    TP  6.8  6.6   ALB  3.2*  3.1*      CBC w/Diff Recent Labs      10/23/16   0224  10/22/16   0819   WBC  5.1  5.3   RBC  2.61*  2.57*   HGB  8.1*  7.9*   HCT  26.1*  25.3*   PLT  88*  93*   GRANS  70.1*  74.0*   LYMPH  7.7*  8.3*   EOS  11.4*  9.0*            Microbiology Results  No results for input(s): SDES, CULT in the last 72 hours.    Nolon Lennert, MD  October 23, 2016  [facility]  [phone]

## 2016-10-23 NOTE — ED Provider Notes (Signed)
HPI Comments: 10:29 PM David [name] is a 46 y.o. male with h/o HTN, asthma, C. Diff, ESRD, MI, and chronic respiratory failure who presents to ED complaining of left sided CP that radiating into his left arm that feels like a pressure and has been constant for the last few days. Also reports having severe sharp, diffuse abdominal pain with N/V/D that has been constant since Sunday. Reports recently has been treated for Cdiff; was on vancomycin and he finished the full course 2 weeks ago, but is still having diarrhea. The pt states he has also been feeling more SOB on exertion lately; he is on oxygen at home as needed. Reports a fever last night of 101. The pt was admitted to [facility] and left AMA this morning; says he felt like he was receiving bad care. He was dialyzed there yesterday and this morning. Denies back pain, neck pain, dysuria, weakness, numbness, jaw pain, or any other associated sx. No other complaints or concerns.          PCP: None      The history is provided by the patient. No language interpreter was used.        Past Medical History:   Diagnosis Date   ??? Adverse effect of anesthesia     sleep apnea uses oxygen at night    ??? Ascites    ??? Asthma    ??? C. difficile colitis    ??? Chronic respiratory failure with hypoxia ([place])     on 3-4L n/c at home.   ??? ESRD (end stage renal disease) on dialysis [facility])    ??? HTN (hypertension)    ??? Myocardial infarct [facility])    ??? OSA (obstructive sleep apnea)     reports does not have cpap   ??? Peritoneal dialysis catheter infection ([place])    ??? SBP (spontaneous bacterial peritonitis) ([place])    ??? Vomiting 08/20/2016       Past Surgical History:   Procedure Laterality Date   ??? COLONOSCOPY N/A 03/04/2016    COLONOSCOPY performed by Macie Burows, MD at [facility]   ??? HX HERNIA REPAIR  2008    right inguinal and umbilical   ??? HX VASCULAR ACCESS      Hx of one site on R, 2 on L, currently dialyzing through L Tunnel cath.   ??? SIGMOIDOSCOPY,DIAGNOSTIC  03/04/2016               Family History:   Problem Relation Age of Onset   ??? Hypertension Mother    ??? Kidney Disease Neg Hx    ??? Liver Disease Neg Hx        Social History     Social History   ??? Marital status: SINGLE     Spouse name: N/A   ??? Number of children: N/A   ??? Years of education: N/A     Occupational History   ??? Not on file.     Social History Main Topics   ??? Smoking status: Current Every Day Smoker     Packs/day: 0.25     Years: 10.00   ??? Smokeless tobacco: Never Used      Comment: 3 cigs a day   ??? Alcohol use No   ??? Drug use: No   ??? Sexual activity: Not on file     Other Topics Concern   ??? Not on file     Social History Narrative  ALLERGIES: Lisinopril and Toradol [ketorolac]    Review of Systems   Constitutional: Positive for fever.   HENT: Negative.  Negative for congestion.    Eyes: Negative.  Negative for visual disturbance.   Respiratory: Positive for shortness of breath. Negative for cough.    Cardiovascular: Positive for chest pain. Negative for leg swelling.   Gastrointestinal: Positive for abdominal pain, diarrhea, nausea and vomiting.   Genitourinary: Negative.    Musculoskeletal: Negative.    Skin: Negative.  Negative for rash and wound.   Neurological: Negative.  Negative for weakness, light-headedness and numbness.   Psychiatric/Behavioral: Negative.  Negative for self-injury.   All other systems reviewed and are negative.      Vitals:    10/24/16 0300 10/24/16 0330 10/24/16 0453 10/24/16 0503   BP: (!) 164/103 166/81     Pulse: 94 (!) 104 93 95   Resp: _0 Temp:       SpO2: 99% 99%              Physical Exam   Constitutional: He is oriented to person, place, and time. He appears well-developed and well-nourished. No distress.   HENT:   Head: Normocephalic and atraumatic.   Eyes: Conjunctivae and EOM are normal. Pupils are equal, round, and reactive to light. No scleral icterus.   Neck: Normal range of motion. Neck supple. JVD (mild) present. No thyromegaly present.    Cardiovascular: Normal rate, regular rhythm, S1 normal and S2 normal.  Exam reveals no gallop and no friction rub.    No murmur heard.  Pulmonary/Chest: Effort normal. No accessory muscle usage. No respiratory distress.   Crackles in lung bases bilaterally. Tunneled dialysis catheter in left upper chest.    Abdominal: Soft. Normal appearance. He exhibits distension, fluid wave and ascites. There is tenderness. There is no rigidity, no rebound and no guarding.   Distended abdomen with ascites and positive fluid wave. Diffuse abdominal tenderness. Tenderness to heal tap.   Musculoskeletal: Normal range of motion. He exhibits no edema or tenderness.   +3 pitting edema bilateral LE with hyper pigmentation and chronic venous stasis changes   Neurological: He is alert and oriented to person, place, and time. He has normal strength. No cranial nerve deficit or sensory deficit. Coordination normal.   Skin: Skin is warm and intact. No rash noted.   Skin warm to touch.   Psychiatric: He has a normal mood and affect. His speech is normal and behavior is normal.   Nursing note and vitals reviewed.       MDM  Number of Diagnoses or Management Options  Abdominal pain, generalized:   Atypical chest pain:   Cirrhosis of liver with ascites, unspecified hepatic cirrhosis type ([place]):   Thrombocytopenia [facility]):   Diagnosis management comments: [name] is a 46 y.o. Male with ESRD on HD and cirrhosis coming in with multiple complaints.    1. CP - Has been constant for days now, had similar complaints at [facility] and thought to be due to volume overload. Was dialyzed twice there. Trop minimally elevated at 0.07 and 0.09, consistent with baseline and not consistent with ACS. No pleuritic pain, no hypoxia, low suspicion of PE.     2. Abdominal pain - CT negative, diagnostic paracentesis done, no evidence of SBP. Feeling better after morphine.    3. Diarrhea - Hx of C Diff, reports diarrhea and vomiting, but no vomiting  or diarrhea after being in the ED overnight.  Afebrile throughout stay here.     4. ESRD - Dialyzed yesterday and day of presentation. No need for emergent dialysis. Has a dialysis center at [facility] and plans to go Monday morning for dialysis. Reports that he should have no trouble with transport back there.           ED Course       Other Procedure  Date/Time: 10/24/2016 2:37 AM  Performed by: Evelena Leyden  Authorized by: Evelena Leyden     Consent:     Consent obtained:  Verbal and written    Consent given by:  Patient    Risks discussed:  Bleeding, infection and pain    Alternatives discussed:  No treatment  Universal protocol:     Procedure explained and questions answered to patient or proxy's satisfaction: yes      Relevant documents present and verified: yes      Imaging studies available: yes      Required blood products, implants, devices, and special equipment available: no      Site/side marked: yes      Immediately prior to procedure, a time out was called: yes      Patient identity confirmed:  Verbally with patient and arm band  Indications:     Indications:  Diagnostic rule out. SBP.  Pre-procedure details:     Skin preparation:  ChloraPrep    Preparation: Patient was prepped and draped in the usual sterile fashion    Anesthesia (see MAR for exact dosages):     Anesthesia method:  Local infiltration    Local anesthetic:  Lidocaine 1% w/o epi (3cc)  Post-procedure details:     Patient tolerance of procedure:  Tolerated well, no immediate complications  Comments:      Paracentesis. Withdrew 3cc of clear yellow fluid. Estimated blood loss less than 5 cc.          Vitals:  Patient Vitals for the past 12 hrs:   Temp Pulse Resp BP SpO2   10/24/16 0503 - 95 21 - -   10/24/16 0453 - 93 19 - -   10/24/16 0330 - (!) 104 21 166/81 99 %   10/24/16 0300 - 94 21 (!) 164/103 99 %   10/24/16 0245 - 94 19 (!) 166/104 100 %   10/24/16 0215 - 97 24 (!) 166/98 100 %   10/24/16 0200 - 96 19 (!) 166/98 99 %    10/24/16 0145 - 98 20 (!) 159/101 99 %   10/24/16 0144 - 97 24 (!) 169/98 -   10/24/16 0115 - 97 19 (!) 161/97 98 %   10/24/16 0100 - 97 24 (!) 150/91 100 %   10/24/16 0045 - 99 21 (!) 176/104 100 %   10/24/16 0035 - 98 22 (!) 160/108 100 %   10/24/16 0015 - 98 23 153/86 100 %   10/24/16 0004 - 97 22 - 99 %   10/24/16 0003 - - - (!) 173/105 -   10/23/16 2345 - 97 23 (!) 170/104 99 %   10/23/16 2330 - 97 23 (!) 189/107 100 %   10/23/16 2315 - 96 19 (!) 192/113 100 %   10/23/16 2300 - 95 18 (!) 178/103 100 %   10/23/16 2245 - 98 18 (!) 179/101 100 %   10/23/16 2230 - 96 21 (!) 172/93 100 %   10/23/16 2220 99.2 ??F (37.3 ??C) - - - -   10/23/16 2215 - 95 19 Marland Kitchen)  178/92 100 %   10/23/16 2200 - 96 16 (!) 181/98 100 %   10/23/16 2146 - 95 20 (!) 174/94 98 %   10/23/16 2128 97.8 ??F (36.6 ??C) (!) 104 20 (!) 175/107 98 %       Medications Ordered:  Medications   iopamidol (ISOVUE 300) 61 % contrast injection 50-100 mL (not administered)   lidocaine (PF) (XYLOCAINE) 10 mg/mL (1 %) injection (not administered)   lidocaine (PF) (XYLOCAINE) 10 mg/mL (1 %) injection (not administered)   morphine injection 4 mg (not administered)   promethazine (PHENERGAN) injection 25 mg (25 mg IntraMUSCular Given 10/23/16 2242)   nitroglycerin (NITROBID) 2 % ointment 1 Inch (1 Inch Topical Given 10/23/16 2245)   morphine injection 4 mg (4 mg IntraVENous Given 10/24/16 0141)   morphine injection 4 mg (4 mg IntraVENous Given 10/24/16 0303)       Lab Findings:  Recent Results (from the past 12 hour(s))   CBC WITH AUTOMATED DIFF    Collection Time: 10/23/16  9:45 PM   Result Value Ref Range    WBC 4.6 4.6 - 13.2 K/uL    RBC 2.78 (L) 4.70 - 5.50 M/uL    HGB 8.4 (L) 13.0 - 16.0 g/dL    HCT 26.2 (L) 36.0 - 48.0 %    MCV 94.2 74.0 - 97.0 FL    MCH 30.2 24.0 - 34.0 PG    MCHC 32.1 31.0 - 37.0 g/dL    RDW 17.2 (H) 11.6 - 14.5 %    PLATELET 92 (L) 135 - 420 K/uL    MPV 9.7 9.2 - 11.8 FL    NEUTROPHILS 75 (H) 40 - 73 %    LYMPHOCYTES 10 (L) 21 - 52 %     MONOCYTES 5 3 - 10 %    EOSINOPHILS 9 (H) 0 - 5 %    BASOPHILS 1 0 - 2 %    ABS. NEUTROPHILS 3.5 1.8 - 8.0 K/UL    ABS. LYMPHOCYTES 0.5 (L) 0.9 - 3.6 K/UL    ABS. MONOCYTES 0.2 0.05 - 1.2 K/UL    ABS. EOSINOPHILS 0.4 0.0 - 0.4 K/UL    ABS. BASOPHILS 0.0 0.0 - 0.1 K/UL    DF AUTOMATED      PLATELET COMMENTS DECREASED PLATELETS      RBC COMMENTS ANISOCYTOSIS  1+       METABOLIC PANEL, COMPREHENSIVE    Collection Time: 10/23/16  9:45 PM   Result Value Ref Range    Sodium 140 136 - 145 mmol/L    Potassium 4.7 3.5 - 5.5 mmol/L    Chloride 104 100 - 108 mmol/L    CO2 28 21 - 32 mmol/L    Anion gap 8 3.0 - 18 mmol/L    Glucose 110 (H) 74 - 99 mg/dL    BUN 43 (H) 7.0 - 18 MG/DL    Creatinine 7.55 (H) 0.6 - 1.3 MG/DL    BUN/Creatinine ratio 6 (L) 12 - 20      GFR est AA 10 (L) >60 ml/min/1.46m    GFR est non-AA 8 (L) >60 ml/min/1.71m   Calcium 6.4 (L) 8.5 - 10.1 MG/DL    Bilirubin, total 0.4 0.2 - 1.0 MG/DL    ALT (SGPT) 18 16 - 61 U/L    AST (SGOT) 28 15 - 37 U/L    Alk. phosphatase 267 (H) 45 - 117 U/L    Protein, total 6.7 6.4 - 8.2 g/dL    Albumin  3.1 (L) 3.4 - 5.0 g/dL    Globulin 3.6 2.0 - 4.0 g/dL    A-G Ratio 0.9 0.8 - 1.7     CARDIAC PANEL,(CK, CKMB & TROPONIN)    Collection Time: 10/23/16  9:45 PM   Result Value Ref Range    CK 1659 (H) 39 - 308 U/L    CK - MB 52.9 (H) <3.6 ng/ml    CK-MB Index 3.2 0.0 - 4.0 %    Troponin-I, Qt. 0.07 (H) 0.0 - 0.045 NG/ML   MAGNESIUM    Collection Time: 10/23/16  9:45 PM   Result Value Ref Range    Magnesium 1.9 1.6 - 2.6 mg/dL   PROTHROMBIN TIME + INR    Collection Time: 10/23/16  9:45 PM   Result Value Ref Range    Prothrombin time 14.5 11.5 - 15.2 sec    INR 1.2 0.8 - 1.2     PTT    Collection Time: 10/23/16  9:45 PM   Result Value Ref Range    aPTT 37.7 (H) 23.0 - 36.4 SEC   EKG, 12 LEAD, INITIAL    Collection Time: 10/23/16 10:00 PM   Result Value Ref Range    Ventricular Rate 94 BPM    Atrial Rate 94 BPM    P-R Interval 164 ms    QRS Duration 72 ms     Q-T Interval 390 ms    QTC Calculation (Bezet) 487 ms    Calculated P Axis 53 degrees    Calculated R Axis -10 degrees    Calculated T Axis 104 degrees    Diagnosis       Normal sinus rhythm  Possible Left atrial enlargement  Nonspecific ST and T wave abnormality  Prolonged QT  Abnormal ECG  No previous ECGs available     POC LACTIC ACID    Collection Time: 10/23/16 10:26 PM   Result Value Ref Range    Lactic Acid (POC) 1.1 0.4 - 2.0 mmol/L   CARDIAC PANEL,(CK, CKMB & TROPONIN)    Collection Time: 10/24/16  1:49 AM   Result Value Ref Range    CK 1590 (H) 39 - 308 U/L    CK - MB 44.0 (H) <3.6 ng/ml    CK-MB Index 2.8 0.0 - 4.0 %    Troponin-I, Qt. 0.09 (H) 0.0 - 0.045 NG/ML   CELL COUNT AND DIFF, BODY FLUID    Collection Time: 10/24/16  2:30 AM   Result Value Ref Range    BODY FLUID TYPE ASCITIC FLUID       FLUID COLOR YELLOW      FLUID APPEARANCE HAZY      FLUID RBC CT. 1430 (A) NRRE /cu mm    FLUID NUCLEATED CELLS 70 (A) NRRE /cu mm    FLD NEUTROPHILS PENDING %    FLD BANDS PENDING %    FLD LYMPHS PENDING %    FLD MONOCYTES PENDING %    FLD EOSINS PENDING %       EKG Interpretation by ED physician:  Rhythm: Sinus Rhythm  Rate: 94 bpm  Interpretation: Nonspecific T-wave flattening and inversion in lateral leads.   EKG interpret by Evelena Leyden, MD 10:14 PM    X-ray, CT or radiology findings or impressions:  CT ABD PELV WO CONT   Final Result      XR CHEST PORT   Final Result        CXR preliminary reading done by Dr. Evelena Leyden, MD;  pt xray shows cardiomegaly and mild cardiovascular congestion.    IMPRESSION: Mild cardiomegaly. Atherosclerosis.    CT ABD:  IMPRESSION:   1.  Limited study due to large volume ascites and diffuse body edema/anasarca.  Cellulitis not completely excluded.  ??  2.  No bowel obstruction. Fecal retention in the colon. No significant fluid  within the colonic lumen. No definite mucosal thickening.    Progress notes, consult notes, or additional procedure notes:   5:09 AM I have discussed labs and imaging with the pt. Recommended the pt go see his nephrologist and dialysis center [facility]. He reports that this should not be a problem and that will follow up as recommended. Gave strict return precautions for worsening symptoms.    Diagnosis:   1. Atypical chest pain    2. Abdominal pain, generalized    3. Cirrhosis of liver with ascites, unspecified hepatic cirrhosis type ([place])    4. Thrombocytopenia ([place])        Disposition: D/C home    Follow-up Information     Follow up With Details Comments Contact Info    [facility] EMERGENCY DEPT  If symptoms worsen, As needed [address] [address]  [phone]           Patient's Medications   Start Taking    No medications on file   Continue Taking    ALBUTEROL (PROVENTIL HFA, VENTOLIN HFA, PROAIR HFA) 90 MCG/ACTUATION INHALER    Take 2 Puffs by inhalation every four (4) hours as needed for Wheezing.    AMLODIPINE (NORVASC) 10 MG TABLET    Take 10 mg by mouth daily.    CARVEDILOL (COREG) 25 MG TABLET    Take 25 mg by mouth two (2) times daily (with meals).    FUROSEMIDE (LASIX) 80 MG TABLET    Take 80 mg by mouth daily.    HYDRALAZINE (APRESOLINE) 100 MG TABLET    Take 100 mg by mouth three (3) times daily.    SEVELAMER CARBONATE (RENVELA) 800 MG TAB TAB    Take 800 mg by mouth three (3) times daily.   These Medications have changed    No medications on file   Stop Taking    No medications on file       Scribe [place] acting as a scribe for and in the presence of Evelena Leyden, MD      October 23, 2016 at 9:52 PM       Provider Attestation:      I personally performed the services described in the documentation, reviewed the documentation, as recorded by the scribe in my presence, and it accurately and completely records my words and actions. October 23, 2016 at 9:52 PM - Evelena Leyden, MD

## 2016-10-23 NOTE — Progress Notes (Signed)
Patient admitted on 10/22/2016 from home with   Chief Complaint   Patient presents with   ??? Chest Pain   ??? Diarrhea        The patient has been admitted to the hospital 7 times in the past 12 months.    Previous 4 Admission Dates Admission and Discharge Diagnosis Interventions Barriers Disposition   08/20/16-08/20/16 N&V-missed dialysis   Left AMA   07/13/16-07/14/16 End-stage renal disease, noncompliance   Eloped   06/27/16-06/28/16 chest and abdominal pain and nausea, vomiting and diarrhea   Left AMA   06/16/16-06/21/16 Recurrent cdiff   Left AMA       Tentative dc plan:    CM spoke with pt who stated he lives alone in [place] and has driven down to [place] to visit a friend. He states that he usually gets his dialysis at [facility] ED and has home O2 that he stated he did not get from a company and "ordered it online". Pt states that his car is in the parking lot and will not need anything when discharged. CM placed TCC referral to make f/u appt as well as social work consult for [facility] and help with coordination of resources.    Facility if plan n/a    Anticipated Discharge Date:   2-3 days    PCP: None     Specialists:     n/a    Face sheet information, address, contact info and insurance verified -yes    Dialysis Unit/ chair time / access:    MWF-per pt @ [facility] ED    Pharmacy:     [facility]  Any issues getting medications-no    DME at home and provider:    none    O2 at home yes provider-pt states he got it offline    Home Environment:    Lives at [address]  [place] [address]    [phone].     Prior to admission open services:     no    Home Health Agency-     n/a    Personal Care Agency-    n/a    Extended Emergency Contact Information  Primary Emergency Contact: [facility] [place]  Home Phone: [phone]  Mobile Phone: [phone]  Relation: Sister      Transportation:     Pt's car is in parking lot    Therapy Recommendations:  OT :y/n     no  PT :y/n     no   SLP :y/n    no    RT Home O2 Evaluation :y/n     no    Wound Care: y/n     no    Change consult (formerly [facility]) :    no    Case Management Assessment    ABUSE/NEGLECT SCREENING   Physical Abuse/Neglect: Denies   Sexual Abuse: Denies   Sexual Abuse: Denies   Other Abuse/Issues: Denies          PRIMARY DECISION MAKER   Primary Decision Maker Name: Jordon Kristiansen   Primary Decision Maker Phone Number: [phone]       Primary Decision Maker Relationship to Patient: Parent                   CARE MANAGEMENT INTERVENTIONS   Readmission Interview Completed: Not Applicable   PCP Verified by CM:  (No PCP-TCC referral )  Transition of Care Consult (CM Consult): Discharge Planning                               Current Support Network: Lives Alone   Reason for Referral: DCP Rounds   History Provided By: Patient   Patient Orientation: Alert and Oriented   Cognition: Alert   Support System Response: Uninvolved   Previous Living Arrangement: Lives Alone Independent       Prior Functional Level: Independent in ADLs/IADLs   Current Functional Level: Independent in ADLs/IADLs   Primary Language: English   Can patient return to prior living arrangement: Yes   Ability to make needs known:: Fair   Family able to assist with home care needs:: No               Types of Needs Identified: No PCP   Anticipated Discharge Needs: TCC   Confirm Follow Up Transport: Self       Plan discussed with Pt/Family/Caregiver: Yes          DISCHARGE LOCATION   Discharge Placement: Home

## 2016-10-23 NOTE — Discharge Summary (Signed)
Hospitalist Discharge Summary      AMA Discharge Summary   Admit Date: 10/22/2016  Discharge Date:  10/23/16      Patient ID:  David [name]  46 y.o.  November 20, 1970    Chief Complaint   Patient presents with   ??? Chest Pain   ??? Diarrhea       Discharge Diagnoses  1. Chest pain: Likely due to volume overload and noncompliance with hemodialysis.  Outside records report the patient has history of noncompliance with dialysis. BNP fo 122,334.  Echo here as below. Noted negative stress testing in 02/2016. Discharge summary from February of this year from outside facility notes chronic, recurrent, noncardiac chest pain with chronically elevated troponin as well as noncompliance with dialysis at that time.  ??  2. ESRD on HD: Dialyzed here on both 7/6 and 7/7. Reported that the patient does not have outpatient HD chair due to long history of nonadherence and was fired by [facility] hemodialysis center, and dialyzes emergently at [facility]. Suspect much of chest pain, and ascites is due to this issue.  ??  3. 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 Outside imaging does not specifically describe cirrhosis. LFTs appear to be appropriate at this time. Ordered ultrasound-guided paracentesis but this was not performed prior to the patient leaving AMA.   ??  4. ? of Recurrent C.diff-POA: Patient reports following ID at other facilities, and this is his 14th or 15th episode and that fecal transplant is been suggested in the past. Consulted ID who recommended continuing vancomycin, but diarrhea improved prior to stool assay being obtained. C.diff assays in EHR have been positive in the past, most recently in 06/2016.     Addendum : C.diff assay here was obtained and was negative.  Larnie Heart-10/24/16, 07:42??    5. Hyperkalemia: Somewhat improved after HD.  ??  6. Chronic hypoxic respiratory failure: Continue oxygen at 3-4 L by nasal cannula.  .  ??  7. Obstructive sleep apnea:  ??  8. Nausea and vomiting: .  ??  9. Hx of  non-compliance/drug seeking behavior: Offered hydromorphone and Norco when necessary for pain, but would avoid increasing doses without significant change exam or imaging findings.  Per multiple old discharge summaries, pain was often not controlled at 1 mg every 4 hours of hydromorphone IV.  Patient declined norco and left AMA on 7/7 per nursing note due to "I am not satisfied with the care here, it's not you all, I am talking about my pain meds."   ??  10. Anemia of CKD: Hemoglobin was stable at 7.9-8.1, platelet count low at 88.  Iron level 116, TIBC 236, Iron sat 49%.    Initial presentation:  From HPI:  David [name] is a 46 y.o. AA male with past medical history of end-stage renal disease on hemodialysis, reportedly follows with [facility], recurrent C. difficile, asthma, obstructive sleep apnea although not on CPAP, chronic hypoxic respiratory failure on 3-4 L of oxygen, ascites, and history of noncompliance/multiple AMA discharges with reported drug seeking behavior on outside records.  He presents today with a chief complaint of diarrhea which began worsening on Sunday. The patient reports has foul odor and yellow color with abdominal pain which she reports is similar to episodes of C. difficile he's had in the past.  He reports he's had 14 episodes of C. difficile has been considered for fecal transplant but that this is not able to be arranged through his main hospital in [place].  He reports that he finished a course of vancomycin and Flagyl approximately 2 weeks ago.  He also reports chest pain shortness of breath which began last night.  Chest pain is left-sided, radiating down the left arm.  He had a negative stress test in November 2017.  He reports he last one dialysis approximately one week ago, and did not dialyze on Monday or Wednesday of this week due to diarrhea.  In the emergency department he's been started on oral vancomycin and Flagyl. BNP is elevated at 122,334.  Initial troponin is 0.052.   Potassium 5.7.  He reports that he typically has ascites drained once every week, but that is been approximately 1-1/2 weeks. He reports that liver diseases he thinks was caused by multiple transfusions due to chronic anemia.  He will ports history of nonalcoholic cirrhosis, although I don't see cirrhosis specifically described on prior imaging results in Epic.  He also reports nausea with poor tolerance of by mouth.  He denies fever.    He was admitted for chest pain which is felt to likely be related to volume overload noncompliance with hemodialysis, end-stage renal disease, ascites, question of recurrent C. difficile, mild hyperkalemia, and nausea and vomiting with plans for monitoring troponin, dialysis, paracentesis.  History of non-complaince and drug-seeking behavior was noted and the patient was placed on pain medications with prn IV hydromorphone and Norco available.    Hospital Course:   Nephrology value to the patient with plans for dialysis as well as check iron stores.  Noted recurrent ascites could make C. difficile difficult to treat due to the rare but possible seeding of ascites with C. Difficile.  Agree with paracentesis for symptom relief, and plan to consult discharge planning, given concerns about noncompliance with dialysis in the past.  ID evaluated and recommended to continue oral vancomycin but to stop metronidazole and weight is C. difficile assay. Felt that his stool was still positive may benefit from fecal transplant.  Patient underwent dialysis again on the seventh.  He is tolerating by mouth and no further diarrhea was noted, with negative C. difficile assay. Paracentesis was not performed during his stay but was planned for the upcoming Monday.  However on the afternoon of the seventh, the patient left AMA as he felt his pain was not adequately controlled with hydromorphone 0.5 mg IV every 4 hours, and had decline Norco by mouth.       Consultants:  Nephrology: Dr. Bobbe Medico  ID:  Dr. Jerilynn Som    Imaging:  Xr Chest Pa Lat    Result Date: 10/22/2016  AP and lateral chest. INDICATION: Chest pain. FINDINGS: Cardiomegaly. Left-sided Vas-Cath in place. Right sided stent overlying the right clavicular region. Minimal atelectatic changes in the lung bases. No effusions. Bony structures intact.     IMPRESSION: Cardiomegaly with minimal atelectatic changes in the lung bases. Other findings as described above.       Echo Results  (Last 48 hours)               10/23/16 1440  2D ECHO COMPLETE ADULT WO CONTRAST Final result    Narrative:  Study ID: [id number]                                                     [facility]                                                     [address] [place], [place] [address]                                Adult Echocardiogram Report       Name: David [name], David [name]       e: 10/23/2016 07:41 AM   MRN: [id number]               Patient Location: GN^[id number]   DOB: [birth year]            Age: 34 yrs   Height: 73 in              Weight: 255 lb                         BSA: 2.4 m2   BP: 182/101 mmHg           HR: 80   Gender: Male               Account #: [id number]   Reason For Study: Congestive Heart Failure   History:   ESRD,HTN,Non compliant,ascites,cdiffMI,STENTS   Ordering Physician: Scarlette Ar   Performed By: Nancie Neas., RDCS       Interpretation Summary   A complete two-dimensional transthoracic echocardiogram was performed (2D, M-   mode, Doppler and color flow Doppler).   The study was technically adequate.   Left ventricular systolic function is in the low normal range   The calculated left ventricular ejection fraction is 51%.   There is mild global hypokinesis of the left ventricle.   There is mild concentric left ventricular hypertrophy.   Tight sided heart is mildly enlarged.   The mitral valve  leaflets are sclerotic with normal mobility.   There is trace mitral regurgitation.   Based on the peak tricuspid regurgitation velocity the estimated right   ventricular systolic pressure is 45 mmHg.   Doppler findings suggest mild to moderate pulmonary hypertension.   Mild tricuspid regurgitation.   There is no previous echo for comparison,There is mention in the notes that   the PT had an echocardiogram at [facility] in [place] which was   reported as normal.   All other findings are noted below.           _____________________________________________________________________________   _           Left Ventricle   The left ventricle is grossly normal in size. There is mild concentric  left   ventricular hypertrophy. The calculated left ventricular ejection fraction is   51%. The transmitral spectral Doppler flow pattern is suggestive of   restrictive physiology. Left ventricular systolic function is low normal.   There is mild global hypokinesis of the left ventricle.           Right Ventricle   The right ventricle is mildly dilated. Right ventricular systolic function is   normal: TAPSE: 2.1.       Atria   The left atrial size is normal. Left atrial volume index: 5226ml/m2. The right   atrium is mildly dilated. Right atrial volume index 7537ml/m2. The interatrial   septum is intact with no evidence for an atrial septal defect.       Mitral Valve   The mitral valve leaflets are sclerotic with normal mobility. There is no   mitral valve stenosis. There is trace mitral regurgitation.       Tricuspid Valve   The tricuspid valve is not well visualized, but is grossly normal. There is   mild tricuspid regurgitation. Based on the peak tricuspid regurgitation   velocity the estimated right ventricular systolic pressure is 45 mmHg.   Doppler findings suggest 'moderate' pulmonary hypertension.   Aortic Valve   Sclerotic edge thickening of the aortic valve with normal cusp mobility. No   aortic stenosis. Trace aortic  regurgitation.       Pulmonic Valve   The pulmonic valve is normal in structure and function.       Great Vessels   The aortic root is normal size.       Pericardium/Pleural   There is no pericardial effusion.       Pt is very short of breath and unable to lie down for the test , He would not   me to finish the echo because it was too painful.       MEASUREMENTS/CALCULATIONS:   MMode/2D Measurements & Calculations   RVDd: 4.1 cm                           LVIDd: 5.4 cm   IVSd: 1.1 cm                           LVIDs: 4.6 cm                                          LVPWd: 1.2 cm              _______________________________________________________________   FS: 14.9 %                             RVDd major: 9.4 cm                                          RVDd minor: 3.5 cm              _______________________________________________________________   TAPSE: 2.1 cm  RA A4Cs: 26.3 cm2          _______________________________________________________________       LA ESV (BP): 62.5 ml                   LA ESV Index (BP): 25.2 ml/m2          _______________________________________________________________       RA ESV Index (A4C): 36.6 ml/m2       Doppler Measurements & Calculations   MV A dur: 0.07 sec                    MV dec time: 0.17 sec   MV E max vel: 117.7 cm/sec   MV A max vel: 88.4 cm/sec   MV E/A: 1.3   LV IVRT: 0.08 sec              _______________________________________________________________   Lat Peak E' Vel: 9.4 cm/sec           Med Peak E' Vel: 5.7 cm/sec              _______________________________________________________________   TV V2 max: 287.8 cm/sec               E/E' lat: 12.5   TV max PG: 33.1 mmHg              _______________________________________________________________   E/E' med: 20.6               Electronically signed byDr. Cyndia Diver, MD   10/23/2016 02:40 PM                 Physical Exam on Discharge:  Visit Vitals   ??? BP (!) 176/95 (BP 1  Location: Right arm, BP Patient Position: Supine)   ??? Pulse 96   ??? Temp 97.8 ??F (36.6 ??C)   ??? Resp 21   ??? Ht 6\' 1"  (1.854 m)   ??? Wt 115.7 kg (255 lb)   ??? SpO2 100%   ??? BMI 33.64 kg/m2     Patient left AMA, prior to exam on 10/23/16.    Most Recent BMP and CBC:    Lab Results   Component Value Date/Time    Sodium 142 10/23/2016 02:24 AM    Potassium 5.4 (H) 10/23/2016 02:24 AM    Chloride 104 10/23/2016 02:24 AM    CO2 27 10/23/2016 02:24 AM    Anion gap 12 10/23/2016 02:24 AM    Glucose 82 10/23/2016 02:24 AM    BUN 62 (H) 10/23/2016 02:24 AM    Creatinine 9.3 (H) 10/23/2016 02:24 AM    BUN/Creatinine ratio 4 (L) 09/21/2016 10:53 PM    GFR est AA 8.0 10/23/2016 02:24 AM    GFR est non-AA 7 10/23/2016 02:24 AM    Calcium 6.2 (L) 10/23/2016 02:24 AM      Lab Results   Component Value Date/Time    WBC 5.1 10/23/2016 02:24 AM    Hemoglobin (POC) 8.2 (L) 03/04/2016 02:04 PM    HGB 8.1 (L) 10/23/2016 02:24 AM    Hematocrit (POC) 24 (L) 07/21/2016 05:03 AM    HCT 26.1 (L) 10/23/2016 02:24 AM    PLATELET 88 (L) 10/23/2016 02:24 AM    MCV 100.0 (H) 10/23/2016 02:24 AM          Condition at discharge: AMA    Disposition:  AMA    PCP:  None    Scarlette Ar, MD  October 23, 2016  20:33

## 2016-10-24 ENCOUNTER — Inpatient Hospital Stay
Admit: 2016-10-24 | Discharge: 2016-10-24 | Disposition: A | Discharge status: Home / Self Care | Payer: MEDICARE | Attending: Emergency Medicine

## 2016-10-24 LAB — CBC WITH AUTOMATED DIFF
ABS. BASOPHILS: 0 10*3/uL (ref 0.0–0.1)
ABS. EOSINOPHILS: 0.4 10*3/uL (ref 0.0–0.4)
ABS. LYMPHOCYTES: 0.5 10*3/uL — ABNORMAL LOW (ref 0.9–3.6)
ABS. MONOCYTES: 0.2 10*3/uL (ref 0.05–1.2)
ABS. NEUTROPHILS: 3.5 10*3/uL (ref 1.8–8.0)
BASOPHILS: 1 % (ref 0–2)
EOSINOPHILS: 9 % — ABNORMAL HIGH (ref 0–5)
HCT: 26.2 % — ABNORMAL LOW (ref 36.0–48.0)
HGB: 8.4 g/dL — ABNORMAL LOW (ref 13.0–16.0)
LYMPHOCYTES: 10 % — ABNORMAL LOW (ref 21–52)
MCH: 30.2 PG (ref 24.0–34.0)
MCHC: 32.1 g/dL (ref 31.0–37.0)
MCV: 94.2 FL (ref 74.0–97.0)
MONOCYTES: 5 % (ref 3–10)
MPV: 9.7 FL (ref 9.2–11.8)
NEUTROPHILS: 75 % — ABNORMAL HIGH (ref 40–73)
PLATELET COMMENTS: DECREASED
PLATELET: 92 10*3/uL — ABNORMAL LOW (ref 135–420)
RBC: 2.78 M/uL — ABNORMAL LOW (ref 4.70–5.50)
RDW: 17.2 % — ABNORMAL HIGH (ref 11.6–14.5)
WBC: 4.6 10*3/uL (ref 4.6–13.2)

## 2016-10-24 LAB — EKG, 12 LEAD, INITIAL
Atrial Rate: 94 {beats}/min
Calculated P Axis: 53 degrees
Calculated R Axis: -10 degrees
Calculated T Axis: 104 degrees
Diagnosis: NORMAL
P-R Interval: 164 ms
Q-T Interval: 390 ms
QRS Duration: 72 ms
QTC Calculation (Bezet): 487 ms
Ventricular Rate: 94 {beats}/min

## 2016-10-24 LAB — CELL COUNT AND DIFF, BODY FLUID
FLD BANDS: 0 %
FLD EOSINS: 0 %
FLD LYMPHS: 83 %
FLD MONOCYTES: 8 %
FLD NEUTROPHILS: 9 %
FLUID NUCLEATED CELLS: 70 /mm3 — AB
FLUID RBC CT.: 1430 /mm3 — AB

## 2016-10-24 LAB — METABOLIC PANEL, COMPREHENSIVE
A-G Ratio: 0.9 (ref 0.8–1.7)
ALT (SGPT): 18 U/L (ref 16–61)
AST (SGOT): 28 U/L (ref 15–37)
Albumin: 3.1 g/dL — ABNORMAL LOW (ref 3.4–5.0)
Alk. phosphatase: 267 U/L — ABNORMAL HIGH (ref 45–117)
Anion gap: 8 mmol/L (ref 3.0–18)
BUN/Creatinine ratio: 6 — ABNORMAL LOW (ref 12–20)
BUN: 43 MG/DL — ABNORMAL HIGH (ref 7.0–18)
Bilirubin, total: 0.4 MG/DL (ref 0.2–1.0)
CO2: 28 mmol/L (ref 21–32)
Calcium: 6.4 MG/DL — ABNORMAL LOW (ref 8.5–10.1)
Chloride: 104 mmol/L (ref 100–108)
Creatinine: 7.55 MG/DL — ABNORMAL HIGH (ref 0.6–1.3)
GFR est AA: 10 mL/min/{1.73_m2} — ABNORMAL LOW (ref >60)
GFR est non-AA: 8 mL/min/{1.73_m2} — ABNORMAL LOW (ref >60)
Globulin: 3.6 g/dL (ref 2.0–4.0)
Glucose: 110 mg/dL — ABNORMAL HIGH (ref 74–99)
Potassium: 4.7 mmol/L (ref 3.5–5.5)
Protein, total: 6.7 g/dL (ref 6.4–8.2)
Sodium: 140 mmol/L (ref 136–145)

## 2016-10-24 LAB — CARDIAC PANEL,(CK, CKMB & TROPONIN)
CK - MB: 52.9 ng/ml — ABNORMAL HIGH (ref <3.6)
CK - MB: 44 ng/ml — ABNORMAL HIGH (ref <3.6)
CK-MB Index: 3.2 % (ref 0.0–4.0)
CK-MB Index: 2.8 % (ref 0.0–4.0)
CK: 1659 U/L — ABNORMAL HIGH (ref 39–308)
CK: 1590 U/L — ABNORMAL HIGH (ref 39–308)
Troponin-I, QT: 0.07 NG/ML — ABNORMAL HIGH (ref 0.0–0.045)
Troponin-I, QT: 0.09 NG/ML — ABNORMAL HIGH (ref 0.0–0.045)

## 2016-10-24 LAB — MAGNESIUM: Magnesium: 1.9 mg/dL (ref 1.6–2.6)

## 2016-10-24 LAB — PTT: aPTT: 37.7 s — ABNORMAL HIGH (ref 23.0–36.4)

## 2016-10-24 LAB — PROTHROMBIN TIME + INR
INR: 1.2 (ref 0.8–1.2)
Prothrombin time: 14.5 s (ref 11.5–15.2)

## 2016-10-24 LAB — LDH, BODY FLUID: LD, body fld.: 248 U/L

## 2016-10-24 LAB — POC LACTIC ACID: Lactic Acid (POC): 1.1 mmol/L (ref 0.4–2.0)

## 2016-10-24 LAB — EKG 12-LEAD
Atrial Rate: 94 {beats}/min
Diagnosis: NORMAL
P Axis: 53 degrees
P-R Interval: 164 ms
Q-T Interval: 390 ms
QRS Duration: 72 ms
QTc Calculation (Bazett): 487 ms
R Axis: -10 degrees
T Axis: 104 degrees
Ventricular Rate: 94 {beats}/min

## 2016-10-24 MED ORDER — LIDOCAINE (PF) 10 MG/ML (1 %) IJ SOLN
10 mg/mL (1 %) | INTRAMUSCULAR | Status: DC
Start: 2016-10-24 — End: 2016-10-24

## 2016-10-24 MED ORDER — MORPHINE 4 MG/ML INTRAVENOUS SOLUTION
4 mg/mL | INTRAVENOUS | Status: AC
Start: 2016-10-24 — End: 2016-10-24
  Administered 2016-10-24: 10:00:00 via INTRAVENOUS

## 2016-10-24 MED ORDER — MORPHINE 4 MG/ML INTRAVENOUS SOLUTION
4 mg/mL | INTRAVENOUS | Status: AC
Start: 2016-10-24 — End: 2016-10-24
  Administered 2016-10-24: 06:00:00 via INTRAVENOUS

## 2016-10-24 MED ORDER — PROMETHAZINE 25 MG/ML INJECTION
25 mg/mL | INTRAMUSCULAR | Status: AC
Start: 2016-10-24 — End: 2016-10-23
  Administered 2016-10-24: 03:00:00 via INTRAMUSCULAR

## 2016-10-24 MED ORDER — IOPAMIDOL 61 % IV SOLN
300 mg iodine /mL (61 %) | Freq: Once | INTRAVENOUS | Status: DC
Start: 2016-10-24 — End: 2016-10-24

## 2016-10-24 MED ORDER — LIDOCAINE (PF) 10 MG/ML (1 %) IJ SOLN
10 mg/mL (1 %) | INTRAMUSCULAR | Status: AC
Start: 2016-10-24 — End: 2016-10-24
  Administered 2016-10-24: 07:00:00

## 2016-10-24 MED ORDER — NITROGLYCERIN 2 % TRANSDERMAL OINTMENT
2 % | TRANSDERMAL | Status: AC
Start: 2016-10-24 — End: 2016-10-23
  Administered 2016-10-24: 03:00:00 via TOPICAL

## 2016-10-24 MED ORDER — MORPHINE 4 MG/ML INTRAVENOUS SOLUTION
4 mg/mL | INTRAVENOUS | Status: AC
Start: 2016-10-24 — End: 2016-10-24
  Administered 2016-10-24: 07:00:00 via INTRAVENOUS

## 2016-10-24 MED ORDER — MORPHINE 4 MG/ML SYRINGE
4 mg/mL | INTRAMUSCULAR | Status: AC
Start: 2016-10-24 — End: 2016-10-24

## 2016-10-24 MED FILL — MORPHINE 4 MG/ML INTRAVENOUS SOLUTION: 4 mg/mL | INTRAVENOUS | Qty: 1

## 2016-10-24 MED FILL — LIDOCAINE (PF) 10 MG/ML (1 %) IJ SOLN: 10 mg/mL (1 %) | INTRAMUSCULAR | Qty: 5

## 2016-10-24 MED FILL — ISOVUE-300  61 % INTRAVENOUS SOLUTION: 300 mg iodine /mL (61 %) | INTRAVENOUS | Qty: 100

## 2016-10-24 MED FILL — NITRO-BID 2 % TRANSDERMAL OINTMENT: 2 % | TRANSDERMAL | Qty: 1

## 2016-10-24 MED FILL — PROMETHAZINE 25 MG/ML INJECTION: 25 mg/mL | INTRAMUSCULAR | Qty: 1

## 2016-10-24 NOTE — ED Notes (Signed)
Discharge and f/u instructions given to pt. Verbalized understanding. Pt ambulated out of ED. Called for ride.

## 2016-10-24 NOTE — ED Notes (Signed)
Pt removed O2 sat monitor and BP cuff again. Refused to wear at this time.

## 2016-10-24 NOTE — ED Notes (Signed)
Pt states still unable to give stool specimen.

## 2016-10-25 ENCOUNTER — Encounter (HOSPITAL_COMMUNITY): Payer: Self-pay

## 2016-10-26 LAB — CULTURE, STOOL

## 2016-10-28 ENCOUNTER — Inpatient Hospital Stay
Admit: 2016-10-28 | Discharge: 2016-10-28 | Disposition: A | Discharge status: Home / Self Care | Payer: MEDICARE | Attending: Emergency Medicine

## 2016-10-28 DIAGNOSIS — R079 Chest pain, unspecified: Secondary | ICD-10-CM

## 2016-10-28 LAB — TROPONIN I: Troponin-I, Qt.: 0.1 ng/mL — ABNORMAL HIGH (ref <0.05)

## 2016-10-28 LAB — CBC WITH AUTOMATED DIFF
ABS. BASOPHILS: 0 10*3/uL (ref 0.0–0.1)
ABS. EOSINOPHILS: 0.4 10*3/uL (ref 0.0–0.4)
ABS. IMM. GRANS.: 0 10*3/uL (ref 0.00–0.04)
ABS. LYMPHOCYTES: 0.4 10*3/uL — ABNORMAL LOW (ref 0.8–3.5)
ABS. MONOCYTES: 0.4 10*3/uL (ref 0.0–1.0)
ABS. NEUTROPHILS: 4.7 10*3/uL (ref 1.8–8.0)
ABSOLUTE NRBC: 0 10*3/uL (ref 0.00–0.01)
BASOPHILS: 0 % (ref 0–1)
EOSINOPHILS: 7 % (ref 0–7)
HCT: 22.9 % — ABNORMAL LOW (ref 36.6–50.3)
HGB: 6.9 g/dL — ABNORMAL LOW (ref 12.1–17.0)
IMMATURE GRANULOCYTES: 0 % (ref 0.0–0.5)
LYMPHOCYTES: 7 % — ABNORMAL LOW (ref 12–49)
MCH: 30.9 PG (ref 26.0–34.0)
MCHC: 30.1 g/dL (ref 30.0–36.5)
MCV: 102.7 FL — ABNORMAL HIGH (ref 80.0–99.0)
MONOCYTES: 7 % (ref 5–13)
MPV: 10.2 FL (ref 8.9–12.9)
NEUTROPHILS: 79 % — ABNORMAL HIGH (ref 32–75)
NRBC: 0 PER 100 WBC
PLATELET: 106 10*3/uL — ABNORMAL LOW (ref 150–400)
RBC: 2.23 M/uL — ABNORMAL LOW (ref 4.10–5.70)
RDW: 16.9 % — ABNORMAL HIGH (ref 11.5–14.5)
WBC: 5.9 10*3/uL (ref 4.1–11.1)

## 2016-10-28 LAB — METABOLIC PANEL, COMPREHENSIVE
A-G Ratio: 0.9 — ABNORMAL LOW (ref 1.1–2.2)
ALT (SGPT): 17 U/L (ref 12–78)
AST (SGOT): 29 U/L (ref 15–37)
Albumin: 3.1 g/dL — ABNORMAL LOW (ref 3.5–5.0)
Alk. phosphatase: 261 U/L — ABNORMAL HIGH (ref 45–117)
Anion gap: 11 mmol/L (ref 5–15)
BUN/Creatinine ratio: 5 — ABNORMAL LOW (ref 12–20)
BUN: 43 MG/DL — ABNORMAL HIGH (ref 6–20)
Bilirubin, total: 0.4 MG/DL (ref 0.2–1.0)
CO2: 25 mmol/L (ref 21–32)
Calcium: 6.4 MG/DL — CL (ref 8.5–10.1)
Chloride: 105 mmol/L (ref 97–108)
Creatinine: 9 MG/DL — ABNORMAL HIGH (ref 0.70–1.30)
GFR est AA: 8 mL/min/{1.73_m2} — ABNORMAL LOW (ref >60)
GFR est non-AA: 6 mL/min/{1.73_m2} — ABNORMAL LOW (ref >60)
Globulin: 3.3 g/dL (ref 2.0–4.0)
Glucose: 119 mg/dL — ABNORMAL HIGH (ref 65–100)
Potassium: 4.7 mmol/L (ref 3.5–5.1)
Protein, total: 6.4 g/dL (ref 6.4–8.2)
Sodium: 141 mmol/L (ref 136–145)

## 2016-10-28 LAB — EKG, 12 LEAD, INITIAL
Atrial Rate: 107 {beats}/min
Calculated P Axis: 70 degrees
Calculated R Axis: -10 degrees
Calculated T Axis: 85 degrees
P-R Interval: 164 ms
Q-T Interval: 360 ms
QRS Duration: 88 ms
QTC Calculation (Bezet): 480 ms
Ventricular Rate: 107 {beats}/min

## 2016-10-28 LAB — LIPASE: Lipase: 1000 U/L — ABNORMAL HIGH (ref 73–393)

## 2016-10-28 LAB — EKG 12-LEAD
Atrial Rate: 107 {beats}/min
P Axis: 70 degrees
P-R Interval: 164 ms
Q-T Interval: 360 ms
QRS Duration: 88 ms
QTc Calculation (Bazett): 480 ms
R Axis: -10 degrees
T Axis: 85 degrees
Ventricular Rate: 107 {beats}/min

## 2016-10-28 NOTE — ED Notes (Signed)
Patient received discharge instructions by MD and nurse. Patient verbalized understanding of medication use and other discharge instructions.     Updated vitals, IV DC and patient wheeled out of ED, showing no signs of distress. Pt reports relief of most intense pain. All questions answered.

## 2016-10-28 NOTE — ED Notes (Signed)
Patient reports that "since nothing was life threatening, I want to go." MD notified and MD explained to the patient that labs were still in process. MD and charge nurse aware.    Patient, MD and charge RN signed ama forms.

## 2016-10-28 NOTE — ED Notes (Signed)
MD at bedside assessing patient.

## 2016-10-28 NOTE — ED Triage Notes (Signed)
Arrived from home, wheeld from triage reporting left sided chest pain 10/10 that started 0700 yesterday morning. Patient has also reports NVD since  Friday. Dialysis MWF, missed Monday due to diarrhea.    Denies self medicating prior to arrival.

## 2016-10-28 NOTE — ED Provider Notes (Signed)
HPI Comments: The patient presents to the ED with chest pain and abdominal pain. He complains of N/V/D since Friday. He states he has not been able to keep any medications down. He denies any hematemesis or bloody stool. He has hx C diff. He denies any fever. He denies any blood in vomit or stool. He notes generalized abdominal distension and states, "no one knows why I swell." He also has had L sided chest pain since yesterday at 7 AM. He denies any SOB. He notes he missed dialysis on Monday. Last hospital visit, "a while ago" per patient. He notes compliance with BP meds. He states he has been staying with his sister in [place] and getting dialysis at [facility] as he can no longer get dialysis in [place].       Per EDIE and CareEverywhere - The patient has had 96 ER visits in the past year. He has had 64 admissions. 7/3- seen in [facility]. 7/3 - He was admitted to [facility]. 7/4 - Admitted to [facility] Med. 7/5 [facility] ED. 7/6- He was admitted to [facility]. He signed out [facility] and was seen at [facility] on 7/7. 7/8 - Seen at [facility] and [facility]/ 7/9 [facility]. He as seen at [facility] on 7/9/  He was then admitted to [facility] 7/10 and discharged yesterday at 4:52 PM. Per [facility], he had dialysis while at [facility].    Patient is a 46 y.o. male presenting with chest pain. The history is provided by the patient.   Chest Pain (Angina)    Associated symptoms include abdominal pain, nausea and vomiting. Pertinent negatives include no cough, no fever, no headaches, no shortness of breath and no weakness.        Past Medical History:   Diagnosis Date   ??? Adverse effect of anesthesia     sleep apnea uses oxygen at night    ??? Ascites    ??? Asthma    ??? C. difficile colitis    ??? Chronic respiratory failure with hypoxia (HCC)     on 3-4L n/c at home.   ??? ESRD (end stage renal disease) on dialysis [facility])    ??? HTN (hypertension)    ??? Myocardial infarct [facility])     ??? OSA (obstructive sleep apnea)     reports does not have cpap   ??? Peritoneal dialysis catheter infection (HCC)    ??? SBP (spontaneous bacterial peritonitis) (HCC)    ??? Vomiting 08/20/2016       Past Surgical History:   Procedure Laterality Date   ??? COLONOSCOPY N/A 03/04/2016    COLONOSCOPY performed by Sandy Salaam, MD at [facility]   ??? HX HERNIA REPAIR  2008    right inguinal and umbilical   ??? HX VASCULAR ACCESS      Hx of one site on R, 2 on L, currently dialyzing through L Tunnel cath.   ??? SIGMOIDOSCOPY,DIAGNOSTIC  03/04/2016              Family History:   Problem Relation Age of Onset   ??? Hypertension Mother    ??? Kidney Disease Neg Hx    ??? Liver Disease Neg Hx        Social History     Social History   ??? Marital status: SINGLE     Spouse name: N/A   ??? Number of children: N/A   ??? Years of education: N/A     Occupational History   ???  Not on file.     Social History Main Topics   ??? Smoking status: Current Every Day Smoker     Packs/day: 0.25     Years: 10.00   ??? Smokeless tobacco: Never Used      Comment: 3 cigs a day   ??? Alcohol use No   ??? Drug use: No   ??? Sexual activity: Not on file     Other Topics Concern   ??? Not on file     Social History Narrative         ALLERGIES: Lisinopril and Toradol [ketorolac]    Review of Systems   Constitutional: Negative for appetite change, chills and fever.   HENT: Negative for congestion, nosebleeds and sore throat.    Eyes: Negative for pain, discharge and visual disturbance.   Respiratory: Negative for cough and shortness of breath.    Cardiovascular: Positive for chest pain and leg swelling.   Gastrointestinal: Positive for abdominal distention, abdominal pain, diarrhea, nausea and vomiting.   Genitourinary: Negative for dysuria.   Musculoskeletal: Negative.    Skin: Negative for rash.   Neurological: Negative for weakness and headaches.   Hematological: Negative for adenopathy.   Psychiatric/Behavioral: Negative.    All other systems reviewed and are negative.       Vitals:    10/28/16 0412   BP: (!) 188/108   Pulse: (!) 106   Resp: 22   Temp: 98.5 ??F (36.9 ??C)   SpO2: 98%   Weight: 113.4 kg (250 lb)   Height: 6\' 1"  (1.854 m)            Physical Exam   Constitutional: He is oriented to person, place, and time. He appears well-developed and well-nourished.   HENT:   Head: Normocephalic and atraumatic.   Mouth/Throat: Oropharynx is clear and moist.   Eyes: Conjunctivae are normal.   Neck: Normal range of motion. Neck supple.   Cardiovascular: Normal rate, regular rhythm and normal heart sounds.    Dialysis line L chest   Pulmonary/Chest: Effort normal and breath sounds normal.   Abdominal: Soft. Bowel sounds are normal. There is no tenderness.   Musculoskeletal: Normal range of motion. He exhibits no edema or tenderness.   Neurological: He is alert and oriented to person, place, and time.   Skin: Skin is warm and dry.   Psychiatric: He has a normal mood and affect. His behavior is normal.   Nursing note and vitals reviewed.       MDM      ED Course       Procedures    ED EKG interpretation:  Rhythm: sinus tachycardia; and regular . Rate (approx.): 107; Axis: normal; P wave: normal; QRS interval: normal ; ST/T wave: non-specific changes; This EKG was interpreted by Levada SchillingJessica N Jaidee Stipe, MD,ED Provider.    4:59 AM  Patient requesting to leave AMA. Hemoglobin in 6.9. CMP and troponin are pending. I explained that he is anemic and likely in need of transfusion. Risks were reviewed. He signed out AMA.

## 2016-10-29 LAB — CULTURE, BODY FLUID W GRAM STAIN
Culture result:: NO GROWTH
GRAM STAIN: NONE SEEN

## 2016-10-29 LAB — CULTURE, BLOOD
Culture result:: NO GROWTH
Culture result:: NO GROWTH

## 2016-11-05 LAB — CROSSMATCH RED CELLS - UNITS: UNIT DIVISION: 0

## 2016-11-05 LAB — TYPE AND SCREEN
ABO/RH(D): O POS
ANTIBODY SCREEN: NEGATIVE
ANTIBODY SCREEN: NEGATIVE
UNITS ORDERED: 1

## 2016-12-13 ENCOUNTER — Inpatient Hospital Stay: Admit: 2016-12-13 | Payer: MEDICARE

## 2016-12-13 ENCOUNTER — Inpatient Hospital Stay
Admit: 2016-12-13 | Discharge: 2016-12-13 | Discharge status: Against Medical Advice | Payer: MEDICARE | Attending: Family Medicine | Admitting: Family Medicine

## 2016-12-13 ENCOUNTER — Emergency Department: Admit: 2016-12-13 | Payer: MEDICARE

## 2016-12-13 DIAGNOSIS — R0789 Other chest pain: Secondary | ICD-10-CM

## 2016-12-13 LAB — CBC WITH AUTOMATED DIFF
ABS. BASOPHILS: 0.1 10*3/uL (ref 0.0–0.1)
ABS. EOSINOPHILS: 0.6 10*3/uL — ABNORMAL HIGH (ref 0.0–0.4)
ABS. IMM. GRANS.: 0 10*3/uL (ref 0.00–0.04)
ABS. LYMPHOCYTES: 0.6 10*3/uL — ABNORMAL LOW (ref 0.8–3.5)
ABS. MONOCYTES: 0.3 10*3/uL (ref 0.0–1.0)
ABS. NEUTROPHILS: 3.5 10*3/uL (ref 1.8–8.0)
ABSOLUTE NRBC: 0 10*3/uL (ref 0.00–0.01)
BASOPHILS: 1 % (ref 0–1)
EOSINOPHILS: 11 % — ABNORMAL HIGH (ref 0–7)
HCT: 22 % — ABNORMAL LOW (ref 36.6–50.3)
HGB: 6.7 g/dL — ABNORMAL LOW (ref 12.1–17.0)
IMMATURE GRANULOCYTES: 0 % (ref 0.0–0.5)
LYMPHOCYTES: 12 % (ref 12–49)
MCH: 30.7 PG (ref 26.0–34.0)
MCHC: 30.5 g/dL (ref 30.0–36.5)
MCV: 100.9 FL — ABNORMAL HIGH (ref 80.0–99.0)
MONOCYTES: 6 % (ref 5–13)
MPV: 9.4 FL (ref 8.9–12.9)
NEUTROPHILS: 70 % (ref 32–75)
NRBC: 0 PER 100 WBC
PLATELET: 108 10*3/uL — ABNORMAL LOW (ref 150–400)
RBC: 2.18 M/uL — ABNORMAL LOW (ref 4.10–5.70)
RDW: 15.7 % — ABNORMAL HIGH (ref 11.5–14.5)
WBC: 5.1 10*3/uL (ref 4.1–11.1)

## 2016-12-13 LAB — METABOLIC PANEL, COMPREHENSIVE
A-G Ratio: 0.9 — ABNORMAL LOW (ref 1.1–2.2)
ALT (SGPT): 21 U/L (ref 12–78)
AST (SGOT): 26 U/L (ref 15–37)
Albumin: 3 g/dL — ABNORMAL LOW (ref 3.5–5.0)
Alk. phosphatase: 319 U/L — ABNORMAL HIGH (ref 45–117)
Anion gap: 10 mmol/L (ref 5–15)
BUN/Creatinine ratio: 5 — ABNORMAL LOW (ref 12–20)
BUN: 36 MG/DL — ABNORMAL HIGH (ref 6–20)
Bilirubin, total: 0.4 MG/DL (ref 0.2–1.0)
CO2: 27 mmol/L (ref 21–32)
Calcium: 7 MG/DL — ABNORMAL LOW (ref 8.5–10.1)
Chloride: 104 mmol/L (ref 97–108)
Creatinine: 7.65 MG/DL — ABNORMAL HIGH (ref 0.70–1.30)
GFR est AA: 9 mL/min/{1.73_m2} — ABNORMAL LOW (ref >60)
GFR est non-AA: 8 mL/min/{1.73_m2} — ABNORMAL LOW (ref >60)
Globulin: 3.4 g/dL (ref 2.0–4.0)
Glucose: 108 mg/dL — ABNORMAL HIGH (ref 65–100)
Potassium: 4.5 mmol/L (ref 3.5–5.1)
Protein, total: 6.4 g/dL (ref 6.4–8.2)
Sodium: 141 mmol/L (ref 136–145)

## 2016-12-13 LAB — SAMPLES BEING HELD

## 2016-12-13 LAB — TROPONIN I
Troponin-I, Qt.: 0.06 ng/mL — ABNORMAL HIGH (ref <0.05)
Troponin-I, Qt.: 0.06 ng/mL — ABNORMAL HIGH (ref <0.05)

## 2016-12-13 MED ORDER — SUCRALFATE 1 GRAM TAB
1 gram | Freq: Four times a day (QID) | ORAL | Status: DC
Start: 2016-12-13 — End: 2016-12-13

## 2016-12-13 MED ORDER — ONDANSETRON (PF) 4 MG/2 ML INJECTION
4 mg/2 mL | Freq: Four times a day (QID) | INTRAMUSCULAR | Status: DC | PRN
Start: 2016-12-13 — End: 2016-12-13
  Administered 2016-12-13: 10:00:00 via INTRAVENOUS

## 2016-12-13 MED ORDER — ONDANSETRON 4 MG TAB, RAPID DISSOLVE
4 mg | ORAL | Status: AC
Start: 2016-12-13 — End: 2016-12-13
  Administered 2016-12-13: 07:00:00 via ORAL

## 2016-12-13 MED ORDER — FENTANYL CITRATE (PF) 50 MCG/ML IJ SOLN
50 mcg/mL | INTRAMUSCULAR | Status: DC | PRN
Start: 2016-12-13 — End: 2016-12-13
  Administered 2016-12-13 (×2): via INTRAVENOUS

## 2016-12-13 MED ORDER — SODIUM CHLORIDE 0.9 % INJECTION
40 mg | Freq: Every day | INTRAMUSCULAR | Status: DC
Start: 2016-12-13 — End: 2016-12-13
  Administered 2016-12-13: 16:00:00 via INTRAVENOUS

## 2016-12-13 MED ORDER — NITROGLYCERIN 2 % TRANSDERMAL OINTMENT
2 % | Freq: Once | TRANSDERMAL | Status: AC
Start: 2016-12-13 — End: 2016-12-13
  Administered 2016-12-13: 10:00:00 via TOPICAL

## 2016-12-13 MED ORDER — SEVELAMER CARBONATE 800 MG TAB
800 mg | Freq: Three times a day (TID) | ORAL | Status: DC
Start: 2016-12-13 — End: 2016-12-13

## 2016-12-13 MED ORDER — ACETAMINOPHEN 325 MG TABLET
325 mg | Freq: Once | ORAL | Status: AC
Start: 2016-12-13 — End: 2016-12-13
  Administered 2016-12-13: 07:00:00 via ORAL

## 2016-12-13 MED ORDER — HYDROMORPHONE 2 MG/ML INJECTION SOLUTION
2 mg/mL | Freq: Four times a day (QID) | INTRAMUSCULAR | Status: DC | PRN
Start: 2016-12-13 — End: 2016-12-13
  Administered 2016-12-13: 16:00:00 via INTRAVENOUS

## 2016-12-13 MED ORDER — CARVEDILOL 12.5 MG TAB
12.5 mg | Freq: Two times a day (BID) | ORAL | Status: DC
Start: 2016-12-13 — End: 2016-12-13

## 2016-12-13 MED ORDER — SALINE PERIPHERAL FLUSH PRN
Freq: Once | INTRAMUSCULAR | Status: DC
Start: 2016-12-13 — End: 2016-12-13

## 2016-12-13 MED ORDER — REGADENOSON 0.4 MG/5 ML IV SYRINGE
0.4 mg/5 mL | Freq: Once | INTRAVENOUS | Status: DC
Start: 2016-12-13 — End: 2016-12-13

## 2016-12-13 MED ORDER — TECHNETIUM TC 99M SESTAMIBI - CARDIOLITE
Freq: Once | Status: AC
Start: 2016-12-13 — End: 2016-12-13
  Administered 2016-12-13: 12:00:00 via INTRAVENOUS

## 2016-12-13 MED ORDER — SODIUM CHLORIDE 0.9 % IJ SYRG
Freq: Three times a day (TID) | INTRAMUSCULAR | Status: DC
Start: 2016-12-13 — End: 2016-12-13
  Administered 2016-12-13 (×2): via INTRAVENOUS

## 2016-12-13 MED ORDER — HYDRALAZINE 20 MG/ML IJ SOLN
20 mg/mL | Freq: Four times a day (QID) | INTRAMUSCULAR | Status: DC | PRN
Start: 2016-12-13 — End: 2016-12-13
  Administered 2016-12-13: 16:00:00 via INTRAVENOUS

## 2016-12-13 MED ORDER — FUROSEMIDE 40 MG TAB
40 mg | Freq: Every day | ORAL | Status: DC
Start: 2016-12-13 — End: 2016-12-13
  Administered 2016-12-13: 15:00:00 via ORAL

## 2016-12-13 MED ORDER — HYDRALAZINE 50 MG TAB
50 mg | Freq: Three times a day (TID) | ORAL | Status: DC
Start: 2016-12-13 — End: 2016-12-13

## 2016-12-13 MED ORDER — CLONIDINE 0.1 MG TAB
0.1 mg | ORAL | Status: DC
Start: 2016-12-13 — End: 2016-12-13

## 2016-12-13 MED ORDER — SODIUM CHLORIDE 0.9 % IJ SYRG
INTRAMUSCULAR | Status: DC | PRN
Start: 2016-12-13 — End: 2016-12-13

## 2016-12-13 MED ORDER — SODIUM CHLORIDE 0.9 % IV
INTRAVENOUS | Status: DC | PRN
Start: 2016-12-13 — End: 2016-12-13

## 2016-12-13 MED ORDER — NICOTINE 21 MG/24 HR DAILY PATCH
21 mg/24 hr | Freq: Every day | TRANSDERMAL | Status: DC
Start: 2016-12-13 — End: 2016-12-13

## 2016-12-13 MED ORDER — AMLODIPINE 5 MG TAB
5 mg | Freq: Every day | ORAL | Status: DC
Start: 2016-12-13 — End: 2016-12-13

## 2016-12-13 MED FILL — PROTONIX 40 MG INTRAVENOUS SOLUTION: 40 mg | INTRAVENOUS | Qty: 40

## 2016-12-13 MED FILL — NORMAL SALINE FLUSH 0.9 % INJECTION SYRINGE: INTRAMUSCULAR | Qty: 20

## 2016-12-13 MED FILL — FENTANYL CITRATE (PF) 50 MCG/ML IJ SOLN: 50 mcg/mL | INTRAMUSCULAR | Qty: 2

## 2016-12-13 MED FILL — ONDANSETRON 4 MG TAB, RAPID DISSOLVE: 4 mg | ORAL | Qty: 1

## 2016-12-13 MED FILL — NORMAL SALINE FLUSH 0.9 % INJECTION SYRINGE: INTRAMUSCULAR | Qty: 10

## 2016-12-13 MED FILL — NITRO-BID 2 % TRANSDERMAL OINTMENT: 2 % | TRANSDERMAL | Qty: 1

## 2016-12-13 MED FILL — SODIUM CHLORIDE 0.9 % IV: INTRAVENOUS | Qty: 250

## 2016-12-13 MED FILL — AMLODIPINE 5 MG TAB: 5 mg | ORAL | Qty: 2

## 2016-12-13 MED FILL — ACETAMINOPHEN 325 MG TABLET: 325 mg | ORAL | Qty: 2

## 2016-12-13 MED FILL — HYDRALAZINE 20 MG/ML IJ SOLN: 20 mg/mL | INTRAMUSCULAR | Qty: 1

## 2016-12-13 MED FILL — ONDANSETRON (PF) 4 MG/2 ML INJECTION: 4 mg/2 mL | INTRAMUSCULAR | Qty: 2

## 2016-12-13 MED FILL — LEXISCAN 0.4 MG/5 ML INTRAVENOUS SYRINGE: 0.4 mg/5 mL | INTRAVENOUS | Qty: 5

## 2016-12-13 MED FILL — FUROSEMIDE 40 MG TAB: 40 mg | ORAL | Qty: 2

## 2016-12-13 MED FILL — MONOJECT PREFILL ADVANCED 0.9 % SODIUM CHLORIDE INJECTION SYRINGE: INTRAMUSCULAR | Qty: 10

## 2016-12-13 MED FILL — HYDROMORPHONE 2 MG/ML INJECTION SOLUTION: 2 mg/mL | INTRAMUSCULAR | Qty: 1

## 2016-12-13 NOTE — ED Notes (Addendum)
Assumed care of patient at this time. Pt c/o continued CP and abd pain, worsened after CT. Pt easily falls back to sleep in room.   On cardiac monitor x 4, 3 L NC, side rails up call bell in reach. Isolation continued. Nephrology planning for HD and blood transfusion today. Pt updated on plan of care.

## 2016-12-13 NOTE — Progress Notes (Signed)
Card:  Chart reviewed.  Multiple presentations for described symptom complex.  Negative cardiac studies and no objective findings.  Will SOR.

## 2016-12-13 NOTE — Discharge Summary (Signed)
Discharge Summary       PATIENT ID: David [name]  MRN: [id number]   DATE OF BIRTH: 1970/07/09    DATE OF ADMISSION: 12/13/2016  1:12 AM    DATE OF DISCHARGE: 12/13/16   PRIMARY CARE PROVIDER: None     ATTENDING PHYSICIAN: Lucky Rathke  DISCHARGING PROVIDER: Lucky Rathke, MD    To contact this individual call [phone] and ask the operator to page.  If unavailable ask to be transferred the Adult Hospitalist Department.    CONSULTATIONS: IP CONSULT TO CARDIOLOGY  IP CONSULT TO NEPHROLOGY  IP CONSULT TO GASTROENTEROLOGY    PROCEDURES/SURGERIES: * No surgery found *    ADMITTING DIAGNOSES & HOSPITAL COURSE:   A 46 year old African-American male with past medical history of end-stage renal disease on hemodialysis Mondays, Wednesdays, Fridays, ascites, asthma, obesity, chronic hypoxic respiratory failure, hypertension, myocardial infarction, coronary artery disease, obstructive sleep apnea, C. diff colitis, spontaneous bacterial peritonitis, presented to the emergency department from home with chief complaint of chest pain, abdominal pain, nausea, vomiting, diarrhea.  Per the initial report, the patient noted he had chest pain starting yesterday at approximately 1930 hours.  Pain described as pressure, "like someone sitting on my chest", severe, rates a 10/10 without exacerbating or alleviating factors, located in the left anterior chest, nonradiating, constant.  Patient also had initial complaints of nausea, vomiting, diarrhea.  There were no reports of the patient initially having any rectal bleeding according to ER reports.  However, the patient later comments incidentally that he had bright red blood per rectum, approximately 7-8 episodes per day, over the last 5 days.  He notably admits that his last hemodialysis treatment on Friday (12/10/2016).  He notes that he normally has been followed at [facility] and now that he is in [place], he would like to  establish care with other specialists.  He reportedly has had ascites and notes that "my stomach keeps filling up."  He reportedly had a paracentesis performed in the past with noted ascites.  He notes that he has anemia, it has been transfused approximately every month for the same with packed blood cells.  He complains of generalized abdominal pain that radiates from the left to the right side to his back, constant, aggravated with any palpation or movement, severe.  Complains of episodes of nausea and vomiting, nonbloody, nonbilious emesis.  No complaints of dizziness, lightheadedness, focal weakness, new onset of numbness, paresthesias, facial droop, headache, visual disturbance, neck pain, current shortness of breath, palpitations, calf pain and swelling, edema, rash, head or neck trauma or falls.  He reportedly had a fever last night according to his report. Here on arrival to the emergency department, the patient is afebrile, temperature 98.3 degrees Fahrenheit, blood pressure 167/89, heart rate 98, respiratory rate 18, O2 saturation 100% on room air.  Blood pressure increased to 177/113.  Labs showed elevated troponin at 0.06 x2 sets.  A 12-lead EKG showed normal sinus rhythm, prolonged QT at 96 beats per minute.  Hemoglobin equals 6.7 (compared to labs, hemoglobin 6.9 on 10/28/2016).  The patient is now seen for admission to hospitalist service for continued evaluation and treatment.        DISCHARGE DIAGNOSES / PLAN:      1. He signed out AMA       PENDING TEST RESULTS:   At the time of discharge the following test results are still pending:     FOLLOW UP APPOINTMENTS:    Follow-up Information  None         ADDITIONAL CARE RECOMMENDATIONS:     DIET: Regular Diet and Cardiac Diet      DISCHARGE MEDICATIONS:  Current Discharge Medication List            NOTIFY YOUR PHYSICIAN FOR ANY OF THE FOLLOWING:   Fever over 101 degrees for 24 hours.   Chest pain, shortness of breath, fever, chills, nausea, vomiting,  diarrhea, change in mentation, falling, weakness, bleeding. Severe pain or pain not relieved by medications.  Or, any other signs or symptoms that you may have questions about.    DISPOSITION:    Home With:   OT  PT  HH  RN       Long term SNF/Inpatient Rehab    Independent/assisted living    Hospice    Other:       PATIENT CONDITION AT DISCHARGE:     Functional status    Poor     Deconditioned     Independent      Cognition     Lucid     Forgetful     Dementia      Catheters/lines (plus indication)    Foley     PICC     PEG     None      Code status     Full code     DNR          CHRONIC MEDICAL DIAGNOSES:  Problem List as of 12/22/16  Date Reviewed: 2016/12/22          Codes Class Noted - Resolved    Anemia ICD-10-CM: D64.9  ICD-9-CM: 285.9  12-22-2016 - Present        Thrombocytopenia (HCC) ICD-10-CM: D69.6  ICD-9-CM: 287.5  22-Dec-2016 - Present        Hypertension ICD-10-CM: I10  ICD-9-CM: 401.9  12-22-16 - Present        Elevated troponin ICD-10-CM: R74.8  ICD-9-CM: 790.6  12-22-16 - Present        * (Principal)Acute chest pain ICD-10-CM: R07.9  ICD-9-CM: 786.50  12/22/2016 - Present        Nausea vomiting and diarrhea ICD-10-CM: R11.2, R19.7  ICD-9-CM: 787.91, 787.01  Dec 22, 2016 - Present        Hyperkalemia, diminished renal excretion ICD-10-CM: E87.5  ICD-9-CM: 276.7  10/22/2016 - Present        Other chest pain ICD-10-CM: R07.89  ICD-9-CM: 786.59  10/22/2016 - Present        Generalized abdominal pain ICD-10-CM: R10.84  ICD-9-CM: 789.07  10/22/2016 - Present        Vomiting ICD-10-CM: R11.10  ICD-9-CM: 787.03  08/20/2016 - Present        Abdominal distention ICD-10-CM: R14.0  ICD-9-CM: 787.3  07/13/2016 - Present        Diarrhea, infectious, adult ICD-10-CM: A09  ICD-9-CM: 009.2  07/13/2016 - Present        ESRD (end stage renal disease) (HCC) ICD-10-CM: N18.6  ICD-9-CM: 585.6  07/13/2016 - Present        Uremia ICD-10-CM: N19  ICD-9-CM: 586  07/13/2016 - Present        Clostridium difficile diarrhea ICD-10-CM: A04.72   ICD-9-CM: 008.45  06/27/2016 - Present        Hypertensive urgency ICD-10-CM: I16.0  ICD-9-CM: 401.9  06/27/2016 - Present        Asthma ICD-10-CM: J45.909  ICD-9-CM: 493.90  06/27/2016 - Present        Volume overload ICD-10-CM: E87.70  ICD-9-CM: 276.69  06/16/2016 - Present        NSTEMI (non-ST elevated myocardial infarction) [facility]) ICD-10-CM: I21.4  ICD-9-CM: 410.70  05/22/2016 - Present        C. difficile colitis ICD-10-CM: A04.72  ICD-9-CM: 008.45  05/15/2016 - Present        Hyperkalemia ICD-10-CM: E87.5  ICD-9-CM: 276.7  05/15/2016 - Present        H/O noncompliance with medical treatment, presenting hazards to health ICD-10-CM: Z91.19  ICD-9-CM: V15.81  03/09/2016 - Present        Diarrhea of infectious origin ICD-10-CM: A09  ICD-9-CM: 009.3  03/09/2016 - Present        Left arm swelling ICD-10-CM: M79.89  ICD-9-CM: 729.81  03/09/2016 - Present        Pain in left upper arm ICD-10-CM: X28.208  ICD-9-CM: 729.5  03/09/2016 - Present        Ascites ICD-10-CM: R18.8  ICD-9-CM: 789.59  03/04/2016 - Present        ESRD (end stage renal disease) on dialysis [facility]) ICD-10-CM: N18.6, Z99.2  ICD-9-CM: 585.6, V45.11  03/04/2016 - Present        Recurrent Clostridium difficile diarrhea ICD-10-CM: A04.71  ICD-9-CM: 008.45  02/29/2016 - Present        Peritonitis (HCC) ICD-10-CM: K65.9  ICD-9-CM: 567.9  11/21/2015 - Present        Gastroenteritis ICD-10-CM: K52.9  ICD-9-CM: 558.9  10/14/2015 - Present              Greater than 15  minutes were spent with the patient on counseling and coordination of care    Signed:   Lucky Rathke, MD  12/13/2016  3:43 PM

## 2016-12-13 NOTE — Consults (Addendum)
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 [facility] made aware  Refusing stress test. Refusing BP meds as well  BP meds adjusted  Prior hx of drug seeking behavior in the past  GI consultation  Renally adjust new meds  Am labs    Discussed with patient and RN    Thanks for the consultation. Renal service will follow patient with you. Please contact me with any questions or concerns.    Initial Consult note         Patient name: David [name]  MR no: [id number]  Date of admission: 12/13/2016  Date of consultation: 12/13/2016  Requested by: Dr. Jinger Neighbors  Reason for consult: ESRD    Patient seen and examined. History obtained from patient and chart review. Relevant labs, data and notes reviewed.      HPI: David [name] is a 46 y.o. male with PMH significant for  ESRD on chronic HD but without a outpatient unit (due to medical non-compliance), Hep B, Recurrent C.Diff, HTN presenting with chest pain/sob. Also reports N/V/D-> noted BRBPR as well. Last HD at [facility] hospital on 12/11/16. Reports running out of BP meds since last HD. Nephrology consulted to resume ESRD management.    PMH:  Past Medical History:   Diagnosis Date   ??? Adverse effect of anesthesia     sleep apnea uses oxygen at night    ??? Ascites    ??? Asthma    ??? C. difficile colitis    ??? Chronic respiratory failure with hypoxia (HCC)     on 3-4L n/c at home.   ??? ESRD (end stage renal disease) on dialysis [facility])    ??? HTN (hypertension)    ??? Myocardial infarct [facility])    ??? OSA (obstructive sleep apnea)     reports does not have cpap   ??? Peritoneal dialysis catheter infection (HCC)    ??? SBP (spontaneous bacterial peritonitis) (HCC)    ??? Vomiting 08/20/2016     PSH:  Past Surgical History:    Procedure Laterality Date   ??? COLONOSCOPY N/A 03/04/2016    COLONOSCOPY performed by Sandy Salaam, MD at [facility]   ??? HX HERNIA REPAIR  2008    right inguinal and umbilical   ??? HX VASCULAR ACCESS      Hx of one site on R, 2 on L, currently dialyzing through L Tunnel cath.   ??? SIGMOIDOSCOPY,DIAGNOSTIC  03/04/2016            Social history:   Social History   Substance Use Topics   ??? Smoking status: Current Every Day Smoker     Packs/day: 0.25     Years: 10.00   ??? Smokeless tobacco: Never Used      Comment: 3 cigs a day   ??? Alcohol use No       Family history:  Not contributory    Allergies   Allergen Reactions   ??? Lisinopril Shortness of Breath   ??? Toradol [Ketorolac] Shortness of Breath       Current Facility-Administered Medications   Medication Dose Route Frequency Last Dose   ??? sodium chloride (NS) flush 5-10 mL  5-10 mL IntraVENous Q8H 10 mL at 12/13/16 1312   ???  sodium chloride (NS) flush 5-10 mL  5-10 mL IntraVENous PRN     ??? fentaNYL citrate (PF) injection 25 mcg  25 mcg IntraVENous Q4H PRN 25 mcg at 12/13/16 1057   ??? ondansetron (ZOFRAN) injection 4 mg  4 mg IntraVENous Q6H PRN 4 mg at 12/13/16 0617   ??? nicotine (NICODERM CQ) 21 mg/24 hr patch 1 Patch  1 Patch TransDERmal DAILY     ??? 0.9% sodium chloride infusion 250 mL  250 mL IntraVENous PRN     ??? regadenoson (LEXISCAN) injection 0.4 mg  0.4 mg IntraVENous RAD ONCE     ??? saline peripheral flush soln 20 mL  20 mL InterCATHeter RAD ONCE     ??? carvedilol (COREG) tablet 25 mg  25 mg Oral BID WITH MEALS     ??? sevelamer carbonate (RENVELA) tab 800 mg  800 mg Oral TID     ??? amLODIPine (NORVASC) tablet 10 mg  10 mg Oral DAILY     ??? furosemide (LASIX) tablet 80 mg  80 mg Oral DAILY 80 mg at 12/13/16 1056   ??? hydrALAZINE (APRESOLINE) tablet 100 mg  100 mg Oral TID     ??? hydrALAZINE (APRESOLINE) 20 mg/mL injection 20 mg  20 mg IntraVENous Q6H PRN 20 mg at 12/13/16 1137   ??? pantoprazole (PROTONIX) 40 mg in sodium chloride 0.9% 10 mL injection   40 mg IntraVENous DAILY 40 mg at 12/13/16 1207   ??? sucralfate (CARAFATE) tablet 1 g  1 g Oral AC&HS     ??? HYDROmorphone (DILAUDID) injection 1 mg  1 mg IntraVENous Q6H PRN 1 mg at 12/13/16 1207   ??? cloNIDine HCl (CATAPRES) tablet 0.1 mg  0.1 mg Oral NOW         ROS (besides HPI):    General: + fever. No weight changes  ENT: No hearing loss or visual changes  Cardiovascular: No Chest pain  Pulmonary: + SOB  GI: No abdominal pain. + Nausea/Vomiting/Diarrhea. +blood in stool  GU: No blood in urine. No foamy or cloudy urine  Musculoskeletal: No joint swelling or redness. No morning stiffness  Endocrine: no cold or heat intolerance  Psych: denies anxiety or depression  Neuro: No light headedness or dizziness    Objective   Visit Vitals   ??? BP (!) 179/105 (BP 1 Location: Right arm, BP Patient Position: At rest)   ??? Pulse 84   ??? Temp 98 ??F (36.7 ??C)   ??? Resp 16   ??? Ht 6\' 1"  (1.854 m)   ??? Wt 105.7 kg (233 lb)   ??? SpO2 100%   ??? BMI 30.74 kg/m2       Physical Exam:    Gen: NAD    HEENT: AT/NC, EOMI, moist mucous membrane, no scleral icterus    Neck: no JVD, no cervical lymphadenopathy, no carotid bruit    Lungs/Chest wall: Breath sounds normal. Symmetrical chest wall expansion. No accessory muscle use. Clear to auscultation    Cardiovascular: Normal S1/S2, normal rate, regular rhythm.     Abdomen: soft, NT, Mild distension, BS+, no HSM    Ext: no clubbing or cyanosis. No edema    Skin: warm and dry. No rashes    GU: no CVA tenderness    CNS: alert awake. Answers appropriately.     Labs/Data:    Lab Results   Component Value Date/Time    Sodium 141 12/13/2016 01:42 AM    Potassium 4.5 12/13/2016 01:42 AM    Chloride 104  12/13/2016 01:42 AM    CO2 27 12/13/2016 01:42 AM    Anion gap 10 12/13/2016 01:42 AM    Glucose 108 (H) 12/13/2016 01:42 AM    BUN 36 (H) 12/13/2016 01:42 AM    Creatinine 7.65 (H) 12/13/2016 01:42 AM    BUN/Creatinine ratio 5 (L) 12/13/2016 01:42 AM    GFR est AA 9 (L) 12/13/2016 01:42 AM     GFR est non-AA 8 (L) 12/13/2016 01:42 AM    Calcium 7.0 (L) 12/13/2016 01:42 AM       Lab Results   Component Value Date/Time    WBC 5.1 12/13/2016 01:42 AM    Hemoglobin (POC) 8.2 (L) 03/04/2016 02:04 PM    HGB 6.7 (L) 12/13/2016 01:42 AM    Hematocrit (POC) 24 (L) 07/21/2016 05:03 AM    HCT 22.0 (L) 12/13/2016 01:42 AM    PLATELET 108 (L) 12/13/2016 01:42 AM    MCV 100.9 (H) 12/13/2016 01:42 AM       Urine analysis: No results found for this or any previous visit.        No components found for: SPEP, UPEP  No results found for: PUQ, PROTU2, PROTU1, BJP1, CPE1, IMEL1, MET2  No results found for: MCACR, MCA1, MCA2, MCA3, MCAU, MCAU2, MCALPOCT    No intake or output data in the 24 hours ending 12/13/16 1444    Wt Readings from Last 3 Encounters:   12/13/16 105.7 kg (233 lb)   10/28/16 113.4 kg (250 lb)   10/22/16 115.7 kg (255 lb)       Signed by:  Enrique Sack, MD  Nephrology and Hypertension  [facility]

## 2016-12-13 NOTE — ED Notes (Signed)
MD Randa Lynn notified of patient's BP of 183/112, also of his 10/10 left lower abdominal pain

## 2016-12-13 NOTE — ED Notes (Signed)
Bedside shift change report given to Lequita Halt (Cabin crew) by Rolly Salter (offgoing nurse). Report included the following information SBAR and ED Summary.

## 2016-12-13 NOTE — ED Notes (Addendum)
Report attempted.     9:58 AM  Report attempted.

## 2016-12-13 NOTE — H&P (Signed)
[place] [facility]  HISTORY AND PHYSICAL      Name:David [name], David [name].  MR#: [id number]  DOB: Oct 21, 1970  ACCOUNT #: [id number]   ADMIT DATE: 12/13/2016    CHIEF COMPLAINT:  Chest pain, nausea, vomiting, diarrhea.    HISTORY OF PRESENT ILLNESS:  A 46 year old African-American male with past medical history of end-stage renal disease on hemodialysis Mondays, Wednesdays, Fridays, ascites, asthma, obesity, chronic hypoxic respiratory failure, hypertension, myocardial infarction, coronary artery disease, obstructive sleep apnea, C. diff colitis, spontaneous bacterial peritonitis, presented to the emergency department from home with chief complaint of chest pain, abdominal pain, nausea, vomiting, diarrhea.  Per the initial report, the patient noted he had chest pain starting yesterday at approximately 1930 hours.  Pain described as pressure, "like someone sitting on my chest", severe, rates a 10/10 without exacerbating or alleviating factors, located in the left anterior chest, nonradiating, constant.  Patient also had initial complaints of nausea, vomiting, diarrhea.  There were no reports of the patient initially having any rectal bleeding according to ER reports.  However, the patient later comments incidentally that he had bright red blood per rectum, approximately 7-8 episodes per day, over the last 5 days.  He notably admits that his last hemodialysis treatment on Friday (12/10/2016).  He notes that he normally has been followed at [facility] and now that he is in [place], he would like to establish care with other specialists.  He reportedly has had ascites and notes that "my stomach keeps filling up."  He reportedly had a paracentesis performed in the past with noted ascites.  He notes that he has anemia, it has been transfused approximately every month for the same with packed blood cells.  He complains of generalized abdominal pain that radiates from the left to the  right side to his back, constant, aggravated with any palpation or movement, severe.  Complains of episodes of nausea and vomiting, nonbloody, nonbilious emesis.  No complaints of dizziness, lightheadedness, focal weakness, new onset of numbness, paresthesias, facial droop, headache, visual disturbance, neck pain, current shortness of breath, palpitations, calf pain and swelling, edema, rash, head or neck trauma or falls.  He reportedly had a fever last night according to his report. Here on arrival to the emergency department, the patient is afebrile, temperature 98.3 degrees Fahrenheit, blood pressure 167/89, heart rate 98, respiratory rate 18, O2 saturation 100% on room air.  Blood pressure increased to 177/113.  Labs showed elevated troponin at 0.06 x2 sets.  A 12-lead EKG showed normal sinus rhythm, prolonged QT at 96 beats per minute.  Hemoglobin equals 6.7 (compared to labs, hemoglobin 6.9 on 10/28/2016).  The patient is now seen for admission to hospitalist service for continued evaluation and treatment.    PAST MEDICAL HISTORY:    1.  End-stage renal disease, hemodialysis Mondays, Wednesdays, Fridays.  2.  Ascites   3.  Asthma.  4.  C. Difficile colitis.  5.  Hypertension.  6.  Myocardial infarction, coronary artery disease.  7.  Obstructive sleep apnea.  Reportedly does not use CPAP.  8.  Peritoneal dialysis catheter infection.  9.  Spontaneous bacterial peritonitis.    PAST SURGICAL HISTORY:  1.  Status post colonoscopy 03/04/2016.  2.  Right inguinal hernia repair.  3.  Umbilical hernia repair in 2008.  4.  Hemodialysis catheter insertion.  5.  Diagnostic sigmoidoscopy 03/03/2016.  6.  Paracentesis.    MEDICATIONS:  Complete medication list reviewed and noted on chart records.  Taking Last Dose Start Date End Date Provider     ??  albuterol (PROVENTIL HFA, VENTOLIN HFA, PROAIR HFA) 90 mcg/actuation inhaler  12/01/2016 ??-- ??-- ??Phys Other, MD    ??  Take 2 Puffs by inhalation every four (4) hours as needed for Wheezing.   ??  amLODIPine (NORVASC) 10 mg tablet  12/08/2016 ??-- ??-- ??Phys Other, MD   ??  Take 10 mg by mouth daily.   ??  carvedilol (COREG) 25 mg tablet  12/08/2016 ??-- ??-- ??Historical Provider   ??  Take 25 mg by mouth two (2) times daily (with meals).   ??  furosemide (LASIX) 80 mg tablet  12/08/2016 ??-- ??-- ??Phys Other, MD   ??  Take 80 mg by mouth daily.   ??  hydrALAZINE (APRESOLINE) 100 mg tablet  12/08/2016 ??-- ??-- ??Phys Other, MD   ??  Take 100 mg by mouth three (3) times daily.   ??  ipratropium-albuterol (COMBIVENT RESPIMAT) 20-100 mcg/actuation inhaler  12/08/2016 ??-- ??-- ??Historical Provider   ??  Take 1 Puff by inhalation every six (6) hours.   ??  sevelamer carbonate (RENVELA) 800 mg tab tab  12/08/2016 ??-- ??-- ??Historical Provider   ??  Take 800 mg by mouth three (3) times daily.   ??       ALLERGIES:  LISINOPRIL, TORADOL, AND MORPHINE.      SOCIAL HISTORY:  Positive smoking cigarettes, but smokes less than a 1/4 pack per patient report.  No reports of alcohol or illicit drugs.    FAMILY HISTORY:  Hypertension in mother.    REVIEW OF SYSTEMS:  Pertinent positives as noted in HPI.  All other systems reviewed and negative.    PHYSICAL EXAMINATION:  VITAL SIGNS:  Temperature 98.3 degrees Fahrenheit, blood pressure 168/109, heart rate of 93, respiratory rate 14, saturation 99% on 4 L O2 nasal cannula.  Recorded weight of 233 pounds (105.7 kg).  Height is 6 feet 1 inches tall.  BMI 30.7.  GENERAL:  Obese male in no acute respiratory distress.  PSYCHIATRIC:  Patient awake, alert, oriented x3.  Slightly anxious.  NEUROLOGIC:  GCS of 15.  Moves extremities x4.  Sensation grossly intact without slurred speech or facial droop.  HEENT:  Normocephalic, atraumatic.  PERRLA is intact.  Sclerae is anicteric.  Conjunctivae are clear.  Nares are patent.  Oropharynx is clear.  Tongue is midline, nonedematous.   NECK:  Supple, without lymphadenopathy, JVD, carotid bruits, or thyromegaly.    LYMPHATIC:  Negative for cervical or supraclavicular adenopathy.  RESPIRATORY:  Lungs clear to auscultation bilaterally.  CARDIOVASCULAR:  Heart regular rate and rhythm.  S1, S2, without murmurs, rubs or gallops.  GASTROINTESTINAL:  Abdomen is soft, generalized tenderness on palpation with voluntary guarding, no rebound or rigidity.  Normoactive bowel sounds. There is moderate distention.  No auscultated abdominal bruit or pulsatile mass.  BACK:  No CVA tenderness.  No step-off deformity.    MUSCULOSKELETAL:  No acute palpable bony deformity.  Negative for calf tenderness.  VASCULAR:  2+ radial, 1+ dorsalis pedis pulses without cyanosis.  Positive clubbing.  Trace lower extremity nonpitting edema.  SKIN:  Warm and dry.    LABORATORY AND IMAGING DATA:  Reviewed.  Sodium 141, potassium 4.5, chloride 104, CO2 of 27, BUN 36, creatinine 7.65, glucose 108, anion gap of 10, calcium 7.0, GFR 9, total bilirubin 0.4, total protein 6.4, albumin 3.0, ALT 21, AST 23, alkaline phosphatase of 319.  Troponin I of 0.06, repeat at  0.06.  WBC 5.1, hemoglobin 6.7, hematocrit 23.0, platelets 108, neutrophils 70%.  Chest x-ray portable revealed an enlarged cardiac silhouette, no acute disease with right subclavian stent and left-sided central venous line unchanged.  A 12-lead EKG results are reviewed and noted in HPI.    IMPRESSION AND PLAN:  1.  Acute chest pain.  Rule out acute coronary syndrome with history of coronary artery disease, myocardial infarction, and PTCA and stent.  Consult with cardiologist.  Repeat serial troponin levels.  Ordered fentanyl (not morphine due to noted allergy), oxygen, nitroglycerin.  No aspirin ordered with noted anemia and possible gastrointestinal bleed with reports of rectal bleeding.  Ordered a nuclear medicine cardiac stress test.  2.  Gastrointestinal bleed -- possible based on the patient's verbalized  history.  Awaiting stool specimen.  Patient defers rectal exam.  Order stool occult blood test.  Follow up with gastroenterologist in a.m.  Repeat CBC.  3.  Anemia -- with history of anemia of chronic disease.  We will order stool occult blood test, iron profile, B12 and folate levels.  Discussed risks, benefits, and potential complications of blood transfusion with the patient.  Consult with nephrologist as the patient may receive blood transfusion during hemodialysis treatment today.  4.  Generalized abdominal pain.  Order CT abdomen and pelvis without IV contrast stat for further evaluation.  Consider for possible ascites.  The patient is afebrile with normal white blood cell count and as such no convincing evidence for SBP (spontaneous bacterial peritonitis).  Keep n.p.o. pending CT reports.  5.  Diarrhea, possible gastrointestinal bleed with rectal bleeding reported per patient.  Plan as noted above.  Orders to look for Clostridium difficile as well, stool and WBC.  6.  Nausea and vomiting.  Ordered Zofran 4 mg IV q.6h. p.r.n.   7.  Generalized abdominal pain.  Ordered fentanyl 25 mcg IV q.4  hours p.r.n., but with safety precaution if a hypertensive urgency.  Order stat dose of nitroglycerin 2%, ordered 1 inch now.  Repeat blood pressure post intervention.  Provide additional antihypertensive medicines as needed.  8.  End-stage renal disease, on hemodialysis.  Consult nephrologist for hemodialysis treatments this morning.  9.  Thrombocytopenia.  Repeat platelet count.  Avoid antiplatelet and anticoagulant therapy for now.  10.  Elevated troponin.  Plan as noted.  Rule out ACS.  11.  Obstructive sleep apnea.  Would recommend using CPAP at bedtime.  12.  Chronic respiratory failure, but chronically per chart records.  Continue oxygen therapy and pulse oximetry monitoring.  13.  Coronary artery disease, history of PTCA and stent.  Consult with cardiology for further evaluation today.     14.  Tobacco abuse.  Encouraged smoking cessation and order nicotine patch.  15.  Obesity.  Would recommend weight loss, heart healthy diet and renal/heart healthy diet.      Andilyn Bettcher L. Vaughan Basta, MD       MLP/MN  D: 12/13/2016 06:00     T: 12/13/2016 07:43  JOB #: [id number]

## 2016-12-13 NOTE — ED Notes (Signed)
TRANSFER - OUT REPORT:    Verbal report given to Kristen RN(name) on KHINGSTON OETKEN  being transferred to [facility](unit) for routine progression of care       Report consisted of patient???s Situation, Background, Assessment and   Recommendations(SBAR).     Information from the following report(s) SBAR, ED Summary, Intake/Output, MAR, Recent Results and Cardiac Rhythm SR was reviewed with the receiving nurse.    Lines:   Peripheral IV 12/13/16 Right Arm (Active)   Site Assessment Clean, dry, & intact 12/13/2016  8:17 AM   Phlebitis Assessment 0 12/13/2016  8:17 AM   Infiltration Assessment 0 12/13/2016  8:17 AM   Dressing Status Clean, dry, & intact 12/13/2016  8:17 AM   Dressing Type Transparent 12/13/2016  8:17 AM   Hub Color/Line Status Patent;Flushed 12/13/2016  8:17 AM        Opportunity for questions and clarification was provided.      Patient transported with:   Monitor  O2 @ 3 liters

## 2016-12-13 NOTE — ED Notes (Addendum)
Hospitalist team 1 paged to discuss continued elevated BP. Waiting for response.     10:08 AM  Nuclear Medicine calls to report that patient is refusing stress test. Hospitalist team and Cardiology paged to Nuclear Medicine.     10:25 AM  Dr. Maisie Fus reports that he will speak to Nuclear Medicine. Dr. Sherian Maroon made aware that patient is refusing stress test- Pt will be transported back to ED without stress test being completed.     10:28 AM  Nephrology paged regarding orders for HD.

## 2016-12-13 NOTE — Progress Notes (Signed)
Pt wants to sign out AMA as he feels he need to establish his care close to home  D/W him the danger of missing HD can cause him death  He decided to go out without HD   He was seen by nephrology and HD was ordered with BT   He refused to get stress test/ HD only agreed for dilaudid which was given to him although he has a drug seeking behavior

## 2016-12-13 NOTE — Progress Notes (Addendum)
Hospitalist Progress Note  Lucky Rathke, MD  Office: [phone]        Date of Service:  2016-12-14  NAME:  David [name]  DOB:  1970-09-10  MRN:  [id number]    Pt seen and examined discussed care plan    He is refusing BP meds and he refused stress test this morning although he is having CP  He is asking for Dilaudid IV for his CP does not want tabs or other narcotics  Requested PMP on him from pharmacy , possible drug seeking behavior?      Hospital Problems  Date Reviewed: Dec 14, 2016          Codes Class Noted POA    Anemia ICD-10-CM: D64.9  ICD-9-CM: 285.9  12/14/16 Unknown        Thrombocytopenia (HCC) ICD-10-CM: D69.6  ICD-9-CM: 287.5  2016-12-14 Unknown        Hypertension ICD-10-CM: I10  ICD-9-CM: 401.9  14-Dec-2016 Unknown        Elevated troponin ICD-10-CM: R74.8  ICD-9-CM: 790.6  12/14/16 Unknown        * (Principal)Acute chest pain ICD-10-CM: R07.9  ICD-9-CM: 786.50  12-14-2016 Unknown        Nausea vomiting and diarrhea ICD-10-CM: R11.2, R19.7  ICD-9-CM: 787.91, 787.01  Dec 14, 2016 Unknown        ESRD (end stage renal disease) (HCC) ICD-10-CM: N18.6  ICD-9-CM: 585.6  07/13/2016 Unknown                Review of Systems:   A comprehensive review of systems was negative.       Vital Signs:    Last 24hrs VS reviewed since prior progress note. Most recent are:  Visit Vitals   ??? BP (!) 196/125 (BP 1 Location: Right arm, BP Patient Position: At rest)   ??? Pulse 87   ??? Temp 97.9 ??F (36.6 ??C)   ??? Resp 16   ??? Ht 6\' 1"  (1.854 m)   ??? Wt 105.7 kg (233 lb)   ??? SpO2 100%   ??? BMI 30.74 kg/m2           Physical Examination:                     Resp:  CTA bilaterally.on NC o2   CV:  Regular rhythm, normal rate, tachy    GI:  Soft, non distended, non tender bs+    Musculoskeletal:  No edema,     Neurologic:  Moves all extremities.  AAOx3, CN II-XII reviewed         PLAN:     1. Will get HD today nephrology consulted   2. Cardiology to f/u later for further testing as he has stents   3. Will give small amount of dilaudid PMP did not reveal a lot of narcotic consumption till may    ______________________________________________________________________  EXPECTED LENGTH OF STAY: - - -  ACTUAL LENGTH OF STAY:          0                 Lucky Rathke, MD

## 2016-12-13 NOTE — ED Provider Notes (Addendum)
HPI Comments: 46 y.o. male with past medical history significant for HTN, CAD with h/o MI, asthma, ESRD on HD MWF, chronic respiratory failure with hypoxia, on chronic O2 therapy, recurrent C diff, who presents ambulatory to the ED with chief complaint of chest pain and diarrhea. Pt states that his chest pain started sometime between 1930-2000 last night (12/12/16). Pt describes pain as feeling like a pressure, like "someone sitting on my chest". He rates his current level of discomfort as severe. Pt also complains of recent diarrhea, vomiting, and nausea. Pt states that he was treated for C diff last month and reports "I think I have it again". He notes that he was last dialyzed on Wednesday of last week; did not go Friday d/t the vomiting and diarrhea. Pt reports that he had a fever last night, but he was noted to be afebrile in triage with temperature of 98.3 ??F. Pt denies cough. There are no other acute medical concerns at this time.    Social hx: Positive for Tobacco use (0.25 packs/day for 10 years); Negative for Alcohol use; Negative for Illicit Drug use  PCP: None    Note written by Guadlupe Spanish. Guy Sandifer , Scribe, as dictated by Arelia Sneddon, MD 2:04 AM      The history is provided by the patient and medical records. No language interpreter was used.        Past Medical History:   Diagnosis Date   ??? Adverse effect of anesthesia     sleep apnea uses oxygen at night    ??? Ascites    ??? Asthma    ??? C. difficile colitis    ??? Chronic respiratory failure with hypoxia (HCC)     on 3-4L n/c at home.   ??? ESRD (end stage renal disease) on dialysis [facility])    ??? HTN (hypertension)    ??? Myocardial infarct [facility])    ??? OSA (obstructive sleep apnea)     reports does not have cpap   ??? Peritoneal dialysis catheter infection (HCC)    ??? SBP (spontaneous bacterial peritonitis) (HCC)    ??? Vomiting 08/20/2016       Past Surgical History:   Procedure Laterality Date   ??? COLONOSCOPY N/A 03/04/2016     COLONOSCOPY performed by Sandy Salaam, MD at [facility]   ??? HX HERNIA REPAIR  2008    right inguinal and umbilical   ??? HX VASCULAR ACCESS      Hx of one site on R, 2 on L, currently dialyzing through L Tunnel cath.   ??? SIGMOIDOSCOPY,DIAGNOSTIC  03/04/2016              Family History:   Problem Relation Age of Onset   ??? Hypertension Mother    ??? Kidney Disease Neg Hx    ??? Liver Disease Neg Hx        Social History     Social History   ??? Marital status: SINGLE     Spouse name: N/A   ??? Number of children: N/A   ??? Years of education: N/A     Occupational History   ??? Not on file.     Social History Main Topics   ??? Smoking status: Current Every Day Smoker     Packs/day: 0.25     Years: 10.00   ??? Smokeless tobacco: Never Used      Comment: 3 cigs a day   ??? Alcohol use No   ??? Drug use:  No   ??? Sexual activity: Not on file     Other Topics Concern   ??? Not on file     Social History Narrative         ALLERGIES: Lisinopril and Toradol [ketorolac]    Review of Systems   Constitutional: Positive for fever. Negative for chills.   HENT: Negative for sore throat.    Eyes: Negative for pain.   Respiratory: Negative for cough.    Cardiovascular: Positive for chest pain.   Gastrointestinal: Positive for diarrhea, nausea and vomiting.   Genitourinary: Negative for dysuria.   Skin: Negative for rash.   Neurological: Negative for syncope and headaches.   Psychiatric/Behavioral: Negative for confusion.   All other systems reviewed and are negative.      Vitals:    12/13/16 0116 12/13/16 0135 12/13/16 0200   BP: 167/89 167/89 (!) 160/93   Pulse: 98  96   Resp: 18  24   Temp: 98.3 ??F (36.8 ??C)     SpO2: 100%  100%   Weight: 105.7 kg (233 lb)     Height: 6\' 1"  (1.854 m)              Physical Exam   Constitutional: He is oriented to person, place, and time. He appears well-developed. No distress.   Appears disheveled   HENT:   Head: Normocephalic and atraumatic.   Eyes: Conjunctivae and EOM are normal.    Neck: Normal range of motion. Neck supple.   Cardiovascular: Normal rate, regular rhythm and normal heart sounds.    No murmur heard.  Pulmonary/Chest: Effort normal and breath sounds normal. No respiratory distress. He exhibits tenderness (mild TTP).   Temporary dialysis catheter in the left chest wall.    Abdominal: Soft. Bowel sounds are normal. He exhibits no distension. There is no tenderness. There is no rebound.   Musculoskeletal: Normal range of motion. He exhibits no edema or tenderness.   Neurological: He is alert and oriented to person, place, and time. No cranial nerve deficit. He exhibits normal muscle tone. Coordination normal.   Skin: Skin is warm and dry.   Nursing note and vitals reviewed.  Note written by Guadlupe Spanish. [place] , Scribe, as dictated by Arelia Sneddon, MD 2:04 AM     MDM      ED Course       Procedures      ED EKG interpretation:  Time: 0121  Rhythm: normal sinus rhythm; Rate (approx.): 96 bpm; Axis: normal; Intervals: normal; ST/T wave: normal; No STEMI; QTC prolonged at 492 ms  Note written by Guadlupe Spanish. Guy Sandifer , Scribe, as dictated by Arelia Sneddon, MD 2:04 AM     CONSULT NOTE:  2:17 AM Arelia Sneddon, MD spoke with Dr. Pennie Banter, Consult for Nephrology.  Discussed available diagnostic tests and clinical findings.  Dr. Allena Katz states that patient require emergent dialysis. He can receive dialysis later this morning if he is requiring hospitalization.

## 2016-12-13 NOTE — ED Notes (Signed)
Spoke with Nephrology regarding pt. Unknown time for dialysis at this time, but advised to give 1 unit and then 1 with dialysis if it takes awhile in the morning.

## 2016-12-13 NOTE — ED Notes (Signed)
Per MD Pegram, consult with nephrology before starting blood transfusion. To consult with Dr. Allena Katz in order for him to place dialysis orders. Dr. Vaughan Basta advises to run dialysis and blood products together to prevent fluid overload.

## 2016-12-13 NOTE — H&P (Signed)
H&P dictated.   Job# [id number].

## 2016-12-13 NOTE — ED Notes (Addendum)
Pt back from Nuclear Medicine. Pt refusing to take BP medication regardless of unknown time of HD. Pt unwilling to learn importance of BP control.     11:06 AM  Pt transported to [address] with Will EMT on cardiac monitor. All belongings sent with patient.

## 2016-12-13 NOTE — ED Triage Notes (Signed)
I have had vomiting, diarrhea and abdominal pain since Wed. Sun. At 1930 I started with pressure in my chest that goes into my L arm and jaw. I usually have dialysis Mon. Wed and Fri. I don't have a kidney doctor so I go to the ED to get dialysis. Last dialysis Wed. I have a hx of C-diff and I am have liquid diarrhea.

## 2016-12-13 NOTE — Progress Notes (Signed)
Admission Medication Reconciliation:    Information obtained from: Patient, RX Query, chart    Significant PMH/Disease States:   Past Medical History:   Diagnosis Date   ??? Adverse effect of anesthesia     sleep apnea uses oxygen at night    ??? Ascites    ??? Asthma    ??? C. difficile colitis    ??? Chronic respiratory failure with hypoxia (HCC)     on 3-4L n/c at home.   ??? ESRD (end stage renal disease) on dialysis [facility])    ??? HTN (hypertension)    ??? Myocardial infarct [facility])    ??? OSA (obstructive sleep apnea)     reports does not have cpap   ??? Peritoneal dialysis catheter infection (HCC)    ??? SBP (spontaneous bacterial peritonitis) (HCC)    ??? Vomiting 08/20/2016       Chief Complaint for this Admission:  CP, N/V/D    Allergies:  Lisinopril and Toradol [ketorolac]    Prior to Admission Medications:   Prior to Admission Medications   Prescriptions Last Dose Informant Patient Reported? Taking?   albuterol (PROVENTIL HFA, VENTOLIN HFA, PROAIR HFA) 90 mcg/actuation inhaler 12/01/2016  Yes No   Sig: Take 2 Puffs by inhalation every four (4) hours as needed for Wheezing.   amLODIPine (NORVASC) 10 mg tablet 12/08/2016  Yes No   Sig: Take 10 mg by mouth daily.   carvedilol (COREG) 25 mg tablet 12/08/2016  Yes No   Sig: Take 25 mg by mouth two (2) times daily (with meals).   furosemide (LASIX) 80 mg tablet 12/08/2016  Yes No   Sig: Take 80 mg by mouth daily.   hydrALAZINE (APRESOLINE) 100 mg tablet 12/08/2016  Yes No   Sig: Take 100 mg by mouth three (3) times daily.   ipratropium-albuterol (COMBIVENT RESPIMAT) 20-100 mcg/actuation inhaler 12/08/2016  Yes Yes   Sig: Take 1 Puff by inhalation every six (6) hours.   sevelamer carbonate (RENVELA) 800 mg tab tab 12/08/2016  Yes No   Sig: Take 800 mg by mouth three (3) times daily.      Facility-Administered Medications: None         Comments/Recommendations: Patient provided history and confirmed allergies, states he "hasn't taken any medicine since last Wednesday."    Added:   1. Combivent Respimat    Thank you for allowing me to participate in the care of your patient.    Overton Mam PharmD, RN [phone]

## 2016-12-13 NOTE — Consults (Signed)
Consults by Talbert Forest, MD at 12/13/16 1444                Author: Talbert Forest, MD  Service: Nephrology  Author Type: Physician       Filed: 12/13/16 1500  Date of Service: 12/13/16 1444  Status: Addendum          Editor: Talbert Forest, MD (Physician)          Related Notes: Original Note by Talbert Forest, MD (Physician) filed at 12/13/16 1456            Consult Orders        1. 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 [facility] made aware   Refusing stress test. Refusing BP meds as well   BP meds adjusted   Prior hx of drug seeking behavior in the past   GI consultation   Renally adjust new meds   Am labs      Discussed with patient and RN      Thanks for the consultation. Renal service will follow patient with you. Please contact me with any questions or concerns.      Initial Consult note           Patient name: David [name]   MR no: [id number]   Date of admission: 12/13/2016   Date of consultation: 12/13/2016   Requested by: Dr. Jinger Neighbors   Reason for consult: ESRD      Patient seen and examined. History obtained from patient and chart review. Relevant labs, data and notes reviewed.         HPI: David [name] is a  46 y.o. male with PMH significant for  ESRD on chronic HD but without a outpatient unit (due to medical non-compliance), Hep  B, Recurrent C.Diff, HTN presenting with chest pain/sob. Also reports N/V/D-> noted BRBPR as well. Last HD at [facility] hospital on 12/11/16. Reports running out of BP meds since last HD. Nephrology consulted to resume ESRD management.      PMH:     Past  Medical History:        Diagnosis  Date         ?  Adverse effect of anesthesia            sleep apnea uses oxygen at night          ?  Ascites       ?  Asthma       ?  C. difficile colitis       ?  Chronic respiratory failure with hypoxia (HCC)  on 3-4L n/c at home.         ?  ESRD (end stage renal disease) on dialysis [facility])       ?  HTN (hypertension)       ?  Myocardial infarct [facility])       ?  OSA (obstructive sleep apnea)            reports does not have cpap         ?  Peritoneal dialysis catheter infection (HCC)       ?  SBP (spontaneous bacterial peritonitis) (HCC)           ?  Vomiting  08/20/2016        PSH:     Past Surgical History:         Procedure  Laterality  Date          ?  COLONOSCOPY  N/A  03/04/2016          COLONOSCOPY performed by Sandy Salaam, MD at [facility]          ?  HX HERNIA REPAIR    2008          right inguinal and umbilical          ?  HX VASCULAR ACCESS              Hx of one site on R, 2 on L, currently dialyzing through L Tunnel cath.          ?  SIGMOIDOSCOPY,DIAGNOSTIC    03/04/2016                      Social history:      Social History       Substance Use Topics         ?  Smoking status:  Current Every Day Smoker              Packs/day:  0.25         Years:  10.00         ?  Smokeless tobacco:  Never Used                Comment: 3 cigs a day         ?  Alcohol use  No           Family history:   Not contributory        Allergies        Allergen  Reactions         ?  Lisinopril  Shortness of Breath         ?  Toradol [Ketorolac]  Shortness of Breath             Current Facility-Administered Medications           Medication  Dose  Route  Frequency  Last Dose            ?  sodium chloride (NS) flush 5-10 mL   5-10 mL  IntraVENous  Q8H  10 mL at 12/13/16 1312     ?  sodium chloride (NS) flush 5-10 mL   5-10 mL  IntraVENous  PRN        ?  fentaNYL citrate (PF) injection 25 mcg   25 mcg  IntraVENous  Q4H PRN  25 mcg at 12/13/16 1057     ?  ondansetron (ZOFRAN) injection  4 mg   4 mg  IntraVENous  Q6H PRN  4 mg at 12/13/16 0617     ?  nicotine (NICODERM CQ) 21 mg/24 hr patch 1 Patch   1 Patch  TransDERmal  DAILY        ?  0.9% sodium chloride infusion 250 mL   250 mL  IntraVENous  PRN        ?  regadenoson (LEXISCAN) injection 0.4 mg   0.4 mg  IntraVENous  RAD ONCE        ?  saline peripheral flush soln 20 mL   20 mL  InterCATHeter  RAD ONCE        ?  carvedilol (COREG) tablet 25 mg   25 mg  Oral  BID WITH MEALS        ?  sevelamer carbonate (RENVELA) tab 800 mg   800 mg  Oral  TID        ?  amLODIPine (NORVASC) tablet 10 mg   10 mg  Oral  DAILY        ?  furosemide (LASIX) tablet 80 mg   80 mg  Oral  DAILY  80 mg at 12/13/16 1056     ?  hydrALAZINE (APRESOLINE) tablet 100 mg   100 mg  Oral  TID        ?  hydrALAZINE (APRESOLINE) 20 mg/mL injection 20 mg   20 mg  IntraVENous  Q6H PRN  20 mg at 12/13/16 1137     ?  pantoprazole (PROTONIX) 40 mg in sodium chloride 0.9% 10 mL injection   40 mg  IntraVENous  DAILY  40 mg at 12/13/16 1207            ?  sucralfate (CARAFATE) tablet 1 g   1 g  Oral  AC&HS               ?  HYDROmorphone (DILAUDID) injection 1 mg   1 mg  IntraVENous  Q6H PRN  1 mg at 12/13/16 1207            ?  cloNIDine HCl (CATAPRES) tablet 0.1 mg   0.1 mg  Oral  NOW              ROS (besides HPI):      General: + fever. No weight changes   ENT: No hearing loss or visual changes   Cardiovascular: No Chest pain   Pulmonary: + SOB   GI: No abdominal pain. + Nausea/Vomiting/Diarrhea. +blood in stool   GU: No blood in urine. No foamy or cloudy urine   Musculoskeletal: No joint swelling or redness. No morning stiffness   Endocrine: no cold or heat intolerance   Psych: denies anxiety or depression   Neuro: No light headedness or dizziness       Objective      Visit Vitals         ?  BP  (!) 179/105 (BP 1 Location: Right arm, BP Patient Position: At rest)     ?  Pulse  84     ?  Temp  98 ??F (36.7 ??C)     ?  Resp  16     ?  Ht  6\' 1"  (1.854 m)     ?  Wt  105.7 kg (233 lb)     ?   SpO2  100%         ?  BMI  30.74 kg/m2           Physical Exam:  Gen: NAD      HEENT: AT/NC, EOMI, moist mucous membrane, no scleral icterus      Neck: no JVD, no cervical lymphadenopathy, no carotid bruit      Lungs/Chest wall: Breath sounds normal. Symmetrical chest wall expansion. No accessory muscle use. Clear to auscultation      Cardiovascular: Normal S1/S2, normal rate, regular rhythm.       Abdomen: soft, NT, Mild distension, BS+, no HSM      Ext: no clubbing or cyanosis. No edema      Skin: warm and dry. No rashes      GU: no CVA tenderness      CNS: alert awake. Answers appropriately.       Labs/Data:        Lab Results         Component  Value  Date/Time            Sodium  141  12/13/2016 01:42 AM       Potassium  4.5  12/13/2016 01:42 AM       Chloride  104  12/13/2016 01:42 AM       CO2  27  12/13/2016 01:42 AM       Anion gap  10  12/13/2016 01:42 AM       Glucose  108 (H)  12/13/2016 01:42 AM       BUN  36 (H)  12/13/2016 01:42 AM       Creatinine  7.65 (H)  12/13/2016 01:42 AM       BUN/Creatinine ratio  5 (L)  12/13/2016 01:42 AM       GFR est AA  9 (L)  12/13/2016 01:42 AM       GFR est non-AA  8 (L)  12/13/2016 01:42 AM            Calcium  7.0 (L)  12/13/2016 01:42 AM             Lab Results         Component  Value  Date/Time            WBC  5.1  12/13/2016 01:42 AM       Hemoglobin (POC)  8.2 (L)  03/04/2016 02:04 PM       HGB  6.7 (L)  12/13/2016 01:42 AM       Hematocrit (POC)  24 (L)  07/21/2016 05:03 AM       HCT  22.0 (L)  12/13/2016 01:42 AM       PLATELET  108 (L)  12/13/2016 01:42 AM            MCV  100.9 (H)  12/13/2016 01:42 AM           Urine analysis: No results found for this or any previous visit.            No components found for: SPEP, UPEP   No results found for: PUQ, PROTU2, PROTU1, BJP1, CPE1, IMEL1, MET2   No results found for: MCACR, MCA1, MCA2, MCA3, MCAU, MCAU2, MCALPOCT      No intake or output data in the 24 hours ending 12/13/16 1444        Wt Readings from Last 3  Encounters:        12/13/16  105.7 kg (233 lb)     10/28/16  113.4 kg (250 lb)        10/22/16  115.7 kg (255 lb)  Signed by:   Marygrace Drought, MD   Nephrology and Hypertension   [facility]

## 2016-12-13 NOTE — Discharge Summary (Signed)
Discharge Summary by Lucky Rathke, MD at 12/13/16 1543                Author: Lucky Rathke, MD  Service: Internal Medicine  Author Type: Physician       Filed: 12/13/16 1544  Date of Service: 12/13/16 1543  Status: Signed          Editor: Lucky Rathke, MD (Physician)                       Discharge Summary           PATIENT ID: David [name]   MRN: [id number]     DATE OF BIRTH: 04-Jun-1970      DATE OF ADMISSION: 12/13/2016  1:12 AM      DATE OF DISCHARGE:  12/13/16    PRIMARY CARE PROVIDER:  None       ATTENDING PHYSICIAN: Lucky Rathke   DISCHARGING PROVIDER:  Lucky Rathke, MD     To contact this individual call [phone] and ask the operator to page.  If unavailable ask to be transferred the Adult Hospitalist Department.      CONSULTATIONS: IP CONSULT TO CARDIOLOGY   IP CONSULT TO NEPHROLOGY   IP CONSULT TO GASTROENTEROLOGY      PROCEDURES/SURGERIES: * No surgery found *      ADMITTING DIAGNOSES & HOSPITAL  COURSE:    A 46 year old African-American male with past medical history of end-stage renal disease on hemodialysis Mondays, Wednesdays, Fridays, ascites, asthma, obesity,  chronic hypoxic respiratory failure, hypertension, myocardial infarction, coronary artery disease, obstructive sleep apnea, C. diff colitis, spontaneous bacterial peritonitis, presented to the emergency department from home with chief complaint of chest  pain, abdominal pain, nausea, vomiting, diarrhea.  Per the initial report, the patient noted he had chest pain starting yesterday at approximately 1930 hours.  Pain described as pressure, "like someone sitting on my chest", severe, rates a 10/10 without  exacerbating or alleviating factors, located in the left anterior chest, nonradiating, constant.  Patient also had initial complaints of nausea, vomiting, diarrhea.  There were no reports of the patient initially having any rectal bleeding according  to ER reports.  However, the patient later comments incidentally that  he had bright red blood per rectum, approximately 7-8 episodes per day, over the last 5 days.  He notably admits that his last hemodialysis treatment on Friday (12/10/2016).  He  notes that he normally has been followed at [facility] and now that he is in [place], he would like to establish care with other specialists.  He reportedly has had ascites and notes that "my stomach keeps filling up."  He reportedly  had a paracentesis performed in the past with noted ascites.  He notes that he has anemia, it has been transfused approximately every month for the same with packed blood cells.  He complains of generalized abdominal pain that radiates from the left  to the right side to his back, constant, aggravated with any palpation or movement, severe.  Complains of episodes of nausea and vomiting, nonbloody, nonbilious emesis.  No complaints of dizziness, lightheadedness, focal weakness, new onset of numbness,  paresthesias, facial droop, headache, visual disturbance, neck pain, current shortness of breath, palpitations, calf pain and swelling, edema, rash, head or neck trauma or falls.  He reportedly had a fever last night according to his report. Here on  arrival to the emergency department, the patient is afebrile, temperature 98.3 degrees Fahrenheit, blood pressure 167/89,  heart rate 98, respiratory rate 18, O2 saturation 100% on room air.  Blood pressure increased to 177/113.  Labs showed elevated  troponin at 0.06 x2 sets.  A 12-lead EKG showed normal sinus rhythm, prolonged QT at 96 beats per minute.  Hemoglobin equals 6.7 (compared to labs, hemoglobin 6.9 on 10/28/2016).  The patient is now seen for admission to hospitalist service for continued  evaluation and treatment.              DISCHARGE DIAGNOSES / P LAN:         1.    He signed out AMA           PENDING TEST RESULTS:    At the time of discharge the following test results are still pending:       FOLLOW UP APPOINTMENTS:       Follow-up  Information           None                   ADDITIONAL CARE RECOMMENDATIONS:       DIET: Regular Diet and Cardiac Diet         DISCHARGE MEDICATIONS:     Current Discharge Medication List                     NOTIFY YOUR PHYSICIAN FOR ANY OF THE FOLLOWING:    Fever over 101 degrees for 24 hours.    Chest pain, shortness of breath, fever, chills, nausea, vomiting, diarrhea, change in mentation, falling, weakness, bleeding. Severe pain or pain not relieved by medications.   Or, any other signs or symptoms that you may have questions about.      DISPOSITION:         Home With:     OT    PT    HH    RN                   Long term SNF/Inpatient Rehab       Independent/assisted living          Hospice          Other:           PATIENT CONDITION AT DISCHARGE:       Functional status         Poor        Deconditioned           Independent         Cognition          Lucid        Forgetful           Dementia         Catheters/lines (plus indication)         Foley        PICC        PEG           None         Code status          Full code           DNR               CHRONIC MEDICAL DIAGNOSES:      Problem List as of 12/17/2016   Date Reviewed:  Dec 17, 2016                Codes  Class  Noted - Resolved  Anemia  ICD-10-CM: D64.9   ICD-9-CM: 285.9    12/13/2016 - Present                       Thrombocytopenia (HCC)  ICD-10-CM: D69.6   ICD-9-CM: 287.5    12/13/2016 - Present                       Hypertension  ICD-10-CM: I10   ICD-9-CM: 401.9    12/13/2016 - Present                       Elevated troponin  ICD-10-CM: R74.8   ICD-9-CM: 790.6    12/13/2016 - Present                       * (Principal)Acute chest pain  ICD-10-CM: R07.9   ICD-9-CM: 786.50    12/13/2016 - Present                       Nausea vomiting and diarrhea  ICD-10-CM: R11.2, R19.7   ICD-9-CM: 787.91, 787.01    12/13/2016 - Present                       Hyperkalemia, diminished renal excretion  ICD-10-CM: E87.5   ICD-9-CM: 276.7    10/22/2016 - Present                        Other chest pain  ICD-10-CM: R07.89   ICD-9-CM: 786.59    10/22/2016 - Present                       Generalized abdominal pain  ICD-10-CM: R10.84   ICD-9-CM: 789.07    10/22/2016 - Present                       Vomiting  ICD-10-CM: R11.10   ICD-9-CM: 787.03    08/20/2016 - Present                       Abdominal distention  ICD-10-CM: R14.0   ICD-9-CM: 787.3    07/13/2016 - Present                       Diarrhea, infectious, adult  ICD-10-CM: A09   ICD-9-CM: 009.2    07/13/2016 - Present                       ESRD (end stage renal disease) (HCC)  ICD-10-CM: N18.6   ICD-9-CM: 585.6    07/13/2016 - Present                       Uremia  ICD-10-CM: N19   ICD-9-CM: 586    07/13/2016 - Present                       Clostridium difficile diarrhea  ICD-10-CM: A04.72   ICD-9-CM: 008.45    06/27/2016 - Present                       Hypertensive urgency  ICD-10-CM: I16.0   ICD-9-CM: 401.9    06/27/2016 - Present  Asthma  ICD-10-CM: J45.909   ICD-9-CM: 493.90    06/27/2016 - Present                       Volume overload  ICD-10-CM: E87.70   ICD-9-CM: 276.69    06/16/2016 - Present                       NSTEMI (non-ST elevated myocardial infarction) (HCC)  ICD-10-CM: I21.4   ICD-9-CM: 410.70    05/22/2016 - Present                       C. difficile colitis  ICD-10-CM: A04.72   ICD-9-CM: 008.45    05/15/2016 - Present                       Hyperkalemia  ICD-10-CM: E87.5   ICD-9-CM: 276.7    05/15/2016 - Present                       H/O noncompliance with medical treatment, presenting hazards to health  ICD-10-CM: Z91.19   ICD-9-CM: V15.81    03/09/2016 - Present                       Diarrhea of infectious origin  ICD-10-CM: A09   ICD-9-CM: 009.3    03/09/2016 - Present                       Left arm swelling  ICD-10-CM: M79.89   ICD-9-CM: 729.81    03/09/2016 - Present                       Pain in left upper arm  ICD-10-CM: Z61.096   ICD-9-CM: 729.5    03/09/2016 - Present                       Ascites   ICD-10-CM: R18.8   ICD-9-CM: 789.59    03/04/2016 - Present                       ESRD (end stage renal disease) on dialysis [facility])  ICD-10-CM: N18.6, Z99.2   ICD-9-CM: 585.6, V45.11    03/04/2016 - Present                       Recurrent Clostridium difficile diarrhea  ICD-10-CM: A04.71   ICD-9-CM: 008.45    02/29/2016 - Present                       Peritonitis (HCC)  ICD-10-CM: K65.9   ICD-9-CM: 567.9    11/21/2015 - Present                       Gastroenteritis  ICD-10-CM: K52.9   ICD-9-CM: 558.9    10/14/2015 - Present                          Greater than 15  minutes were spent with the patient on counseling and coordination of care      Signed:    Lucky Rathke, MD   12/13/2016   3:43 PM

## 2016-12-14 LAB — TYPE + CROSSMATCH
ABO/Rh(D): O POS
Antibody screen: NEGATIVE
Unit division: 0
Unit division: 0

## 2016-12-14 LAB — EKG, 12 LEAD, INITIAL
Atrial Rate: 96 {beats}/min
Calculated P Axis: 66 degrees
Calculated R Axis: -13 degrees
Calculated T Axis: 90 degrees
Diagnosis: NORMAL
P-R Interval: 194 ms
Q-T Interval: 390 ms
QRS Duration: 92 ms
QTC Calculation (Bezet): 492 ms
Ventricular Rate: 96 {beats}/min

## 2016-12-14 LAB — EKG 12-LEAD
Atrial Rate: 96 {beats}/min
Diagnosis: NORMAL
P Axis: 66 degrees
P-R Interval: 194 ms
Q-T Interval: 390 ms
QRS Duration: 92 ms
QTc Calculation (Bazett): 492 ms
R Axis: -13 degrees
T Axis: 90 degrees
Ventricular Rate: 96 {beats}/min

## 2016-12-20 ENCOUNTER — Inpatient Hospital Stay
Admission: AD | Admit: 2016-12-20 | Discharge: 2016-12-20 | DRG: 682 | Discharge status: Against Medical Advice | Payer: Medicare Other | Source: Other Acute Inpatient Hospital | Attending: FAMILY PRACTICE | Admitting: FAMILY PRACTICE

## 2016-12-20 ENCOUNTER — Inpatient Hospital Stay (HOSPITAL_COMMUNITY): Payer: Medicare Other | Admitting: FAMILY PRACTICE

## 2016-12-20 DIAGNOSIS — R748 Abnormal levels of other serum enzymes: Secondary | ICD-10-CM | POA: Diagnosis present

## 2016-12-20 DIAGNOSIS — I120 Hypertensive chronic kidney disease with stage 5 chronic kidney disease or end stage renal disease: Principal | ICD-10-CM | POA: Diagnosis present

## 2016-12-20 DIAGNOSIS — N186 End stage renal disease: Secondary | ICD-10-CM | POA: Diagnosis present

## 2016-12-20 DIAGNOSIS — Z5321 Procedure and treatment not carried out due to patient leaving prior to being seen by health care provider: Secondary | ICD-10-CM | POA: Insufficient documentation

## 2016-12-20 DIAGNOSIS — Z992 Dependence on renal dialysis: Secondary | ICD-10-CM

## 2016-12-20 DIAGNOSIS — K219 Gastro-esophageal reflux disease without esophagitis: Secondary | ICD-10-CM | POA: Diagnosis present

## 2016-12-20 DIAGNOSIS — Z955 Presence of coronary angioplasty implant and graft: Secondary | ICD-10-CM

## 2016-12-20 DIAGNOSIS — Z79899 Other long term (current) drug therapy: Secondary | ICD-10-CM

## 2016-12-20 DIAGNOSIS — Z9115 Patient's noncompliance with renal dialysis: Secondary | ICD-10-CM

## 2016-12-20 DIAGNOSIS — D649 Anemia, unspecified: Secondary | ICD-10-CM | POA: Diagnosis present

## 2016-12-20 DIAGNOSIS — R188 Other ascites: Secondary | ICD-10-CM | POA: Diagnosis present

## 2016-12-20 DIAGNOSIS — F17210 Nicotine dependence, cigarettes, uncomplicated: Secondary | ICD-10-CM | POA: Diagnosis present

## 2016-12-20 DIAGNOSIS — I252 Old myocardial infarction: Secondary | ICD-10-CM

## 2016-12-20 DIAGNOSIS — I2510 Atherosclerotic heart disease of native coronary artery without angina pectoris: Secondary | ICD-10-CM | POA: Diagnosis present

## 2016-12-20 DIAGNOSIS — A0472 Enterocolitis due to Clostridium difficile, not specified as recurrent: Secondary | ICD-10-CM | POA: Diagnosis present

## 2016-12-20 DIAGNOSIS — R079 Chest pain, unspecified: Secondary | ICD-10-CM | POA: Diagnosis present

## 2016-12-20 DIAGNOSIS — J45909 Unspecified asthma, uncomplicated: Secondary | ICD-10-CM | POA: Diagnosis present

## 2016-12-20 DIAGNOSIS — Z5329 Procedure and treatment not carried out because of patient's decision for other reasons: Secondary | ICD-10-CM | POA: Diagnosis not present

## 2016-12-20 DIAGNOSIS — D631 Anemia in chronic kidney disease: Secondary | ICD-10-CM | POA: Diagnosis present

## 2016-12-20 LAB — ECG 12 LEAD - ADULT
Calculated P Axis: 66 deg
Calculated T Axis: 79 deg
EKG Severity: ABNORMAL
PR Interval: 201 ms
QRS Axis: 4 deg
QRS Duration: 103 ms
QT Interval: 450 ms
QTC Calculation: 510 ms
ST Axis: 22 deg
T 40 Axis: -30 deg

## 2016-12-20 LAB — TROPONIN-I: TROPONIN I: 0.06 ng/mL (ref <=0.04)

## 2016-12-20 LAB — C. DIFFICILE PCR: C. DIFFICILE TOXIN GENE, PCR: POSITIVE — CR

## 2016-12-20 MED ORDER — NITROGLYCERIN 0.4 MG SUBLINGUAL TABLET
0.40 mg | SUBLINGUAL_TABLET | SUBLINGUAL | Status: DC | PRN
Start: 2016-12-20 — End: 2016-12-20

## 2016-12-20 MED ORDER — SODIUM CHLORIDE 0.9 % (FLUSH) INJECTION SYRINGE
3.0000 mL | INJECTION | Freq: Three times a day (TID) | INTRAMUSCULAR | Status: DC
Start: 2016-12-20 — End: 2016-12-20
  Administered 2016-12-20 (×2): 3 mL

## 2016-12-20 MED ORDER — HYDRALAZINE 20 MG/ML INJECTION SOLUTION
5.00 mg | INTRAMUSCULAR | Status: DC | PRN
Start: 2016-12-20 — End: 2016-12-20
  Filled 2016-12-20: qty 1

## 2016-12-20 MED ORDER — INSULIN LISPRO 100 UNIT/ML SUBCUTANEOUS SSIP - ~~LOC~~/GRMC
1.0000 [IU] | Freq: Four times a day (QID) | SUBCUTANEOUS | Status: DC
Start: 2016-12-20 — End: 2016-12-20
  Filled 2016-12-20: qty 300

## 2016-12-20 MED ORDER — PANTOPRAZOLE 40 MG TABLET,DELAYED RELEASE
40.0000 mg | DELAYED_RELEASE_TABLET | Freq: Every morning | ORAL | Status: DC
Start: 2016-12-21 — End: 2016-12-20
  Filled 2016-12-20: qty 1

## 2016-12-20 MED ORDER — SEVELAMER HCL 800 MG TABLET
800.00 mg | ORAL_TABLET | Freq: Three times a day (TID) | ORAL | Status: DC
Start: 2016-12-20 — End: 2016-12-20
  Filled 2016-12-20 (×2): qty 1

## 2016-12-20 MED ORDER — PROMETHAZINE 25 MG/ML INJECTION SOLUTION - IV
12.50 mg | Freq: Four times a day (QID) | INTRAMUSCULAR | Status: DC | PRN
Start: 2016-12-20 — End: 2016-12-20

## 2016-12-20 MED ORDER — ACETAMINOPHEN 325 MG TABLET
650.0000 mg | ORAL_TABLET | Freq: Four times a day (QID) | ORAL | Status: DC | PRN
Start: 2016-12-20 — End: 2016-12-20

## 2016-12-20 MED ORDER — DIPHENHYDRAMINE 25 MG CAPSULE
50.0000 mg | ORAL_CAPSULE | Freq: Once | ORAL | Status: DC
Start: 2016-12-20 — End: 2016-12-20
  Administered 2016-12-20: 0 mg via ORAL
  Filled 2016-12-20: qty 2

## 2016-12-20 MED ORDER — DEXTROSE 50 % IN WATER (D50W) INTRAVENOUS SYRINGE
25.0000 g | INJECTION | INTRAVENOUS | Status: DC | PRN
Start: 2016-12-20 — End: 2016-12-20

## 2016-12-20 MED ORDER — FUROSEMIDE 10 MG/ML INJECTION SOLUTION
20.0000 mg | Freq: Once | INTRAMUSCULAR | Status: AC
Start: 2016-12-20 — End: 2016-12-20
  Administered 2016-12-20: 13:00:00 20 mg via INTRAVENOUS
  Filled 2016-12-20: qty 4

## 2016-12-20 MED ORDER — FUROSEMIDE 80 MG TABLET
80.0000 mg | ORAL_TABLET | Freq: Every day | ORAL | Status: DC
Start: 2016-12-20 — End: 2016-12-20
  Filled 2016-12-20: qty 1

## 2016-12-20 MED ORDER — SODIUM CHLORIDE 0.9 % IV BOLUS
40.00 mL | INJECTION | Freq: Once | Status: DC | PRN
Start: 2016-12-20 — End: 2016-12-20

## 2016-12-20 MED ORDER — POLYETHYLENE GLYCOL 3350 17 GRAM ORAL POWDER PACKET
17.0000 g | Freq: Every day | ORAL | Status: DC | PRN
Start: 2016-12-20 — End: 2016-12-20
  Filled 2016-12-20: qty 1

## 2016-12-20 MED ORDER — ACETAMINOPHEN 325 MG TABLET
650.0000 mg | ORAL_TABLET | Freq: Once | ORAL | Status: DC
Start: 2016-12-20 — End: 2016-12-20
  Filled 2016-12-20 (×2): qty 2

## 2016-12-20 MED ORDER — ALBUTEROL SULFATE 2.5 MG/3 ML (0.083 %) SOLUTION FOR NEBULIZATION
3.00 mL | INHALATION_SOLUTION | Freq: Four times a day (QID) | RESPIRATORY_TRACT | Status: DC | PRN
Start: 2016-12-20 — End: 2016-12-20

## 2016-12-20 MED ORDER — ONDANSETRON HCL (PF) 4 MG/2 ML INJECTION SOLUTION
4.00 mg | Freq: Four times a day (QID) | INTRAMUSCULAR | Status: DC | PRN
Start: 2016-12-20 — End: 2016-12-20
  Administered 2016-12-20: 4 mg via INTRAVENOUS
  Filled 2016-12-20: qty 2

## 2016-12-20 MED ORDER — ALUMINUM-MAG HYDROXIDE-SIMETHICONE 200 MG-200 MG-20 MG/5 ML ORAL SUSP
20.0000 mL | ORAL | Status: DC | PRN
Start: 2016-12-20 — End: 2016-12-20

## 2016-12-20 MED ORDER — CARVEDILOL 25 MG TABLET
25.00 mg | ORAL_TABLET | Freq: Two times a day (BID) | ORAL | Status: DC
Start: 2016-12-20 — End: 2016-12-20
  Filled 2016-12-20: qty 1

## 2016-12-20 MED ORDER — HYDRALAZINE 50 MG TABLET
100.00 mg | ORAL_TABLET | Freq: Three times a day (TID) | ORAL | Status: DC
Start: 2016-12-20 — End: 2016-12-20
  Filled 2016-12-20 (×3): qty 2

## 2016-12-20 MED ORDER — SODIUM CHLORIDE 0.9 % (FLUSH) INJECTION SYRINGE
3.0000 mL | INJECTION | INTRAMUSCULAR | Status: DC | PRN
Start: 2016-12-20 — End: 2016-12-20

## 2016-12-20 MED ORDER — ALUMINUM-MAG HYDROXIDE-SIMETHICONE 200 MG-200 MG-20 MG/5 ML ORAL SUSP
30.0000 mL | ORAL | Status: DC | PRN
Start: 2016-12-20 — End: 2016-12-20

## 2016-12-20 NOTE — Care Management Notes (Signed)
Pat rang the bell and asked for the nurse and respiratory. Went to the pat room and pat asked for pain medication. I checked the system and order for Tylenol and NTG pat refused both and asked to leave AMA. Dr. Darylene PriceA. Sabbagh in to see the patient and patient refused care with him as well. Dr. Darylene PriceA. Sabbagh asked if the pat wanted to get the blood transfusion and asked if would consider letting us treat him first and pat still refused care. AMA paper brought to the pat and pat signed. Pat [facility] [employer] [facility] around 70.00$. Dennie Bibleat stated he doesn't have the funds. I called [facility] transport @ [phone] and Pennie RushingSeth stated pat can not be taken since leaving AMA.

## 2016-12-20 NOTE — Care Plan (Signed)
Todd Bibleat has home o2 @ 3-4 l n/c, pat has [facility]ials[facility], unable to make appointment for Monday 12/20/16 d/t getting ill when arrived to visit friend. Pat had cdiff was on vanc and flagyl, last dose of medications was 1 wk ago per pat.

## 2016-12-20 NOTE — Nurses Notes (Signed)
PAT BROUGHT TO FRONT OF HOSPITAL IN W/C AND WAS INFORMED THAT SINCE LEAVING AMA WILL NOT BE HELD RESPONSIBLE. INFORMED PAT TO REMEMBER HE TESTED + FOR CDIFF AND HGB WAS LOW @ 7.5. PAT WAS TO HAVE A BLOOD TRANSFUSION. PAT STATED HE WILL FIGURE IT OUT  AND WANTS TO GO TO HIS HOSPITAL.

## 2016-12-20 NOTE — Incoming ED Transfer Note (Signed)
46 year old male  ESRD--noncompliant  Missed dialysis, MWF, lives in [place]  Chest pain, shortness of breath, abdominal pain  Ascites  COPD  Hgb  8.3  4.9  Cr: 12.7  CT abdomen pelvis with ascites  BLS

## 2016-12-20 NOTE — Consults (Signed)
[facility]  Cardiology Consult  Initial Consultation      [name], [place], 46 y.o. male  Date of service: 12/20/2016  Date of Birth:  1970-11-12    Assessment/Plan:   1. History of chest pain with elevated troponin patient multiple risk factor for coronary artery disease offer the patient to stay for further workup patient refused  2.  Shortness of breath the patient again refused to stay in refused any treatment the patient need to have dialysis he misses dialysis the 3-4 day so far and the patient came from [place] supposed to get his dialysis done in [place] but he did not did not do it and the patient was transferred from the was still hospital  3. Anemia hemoglobin was 7.7 and the patient had chronic anemia  patient refused blood transfusion  4. Chronic renal failure the dialysis the patient refused to stay he is BUN was 28 creatinine 5.88  5. The hypertension patient was advised to continue his medication  6. Ascites secondary to the chronic renal failure patient refused to stay for workup  7. Coronary artery disease history of stenting again patient refused any workup including stress test in the morning  Advised patient to stay at least overnight get blood transfusion good dialysis in the morning and then he can arrange for right to go to [place] and then  take his truck and go to the [place] the patient refuses to stay or do any other workup in here      Subjective     HPI: [name] is a 46 y.o., Black/African American male who presents with shortness of breath had chest pain pain was not rate activity denied any nausea or vomiting the patient also had back pain denied any fever or chills the patient missed his dialysis and he came in here from [place] to do fishing with his friends  Patient had more shortness of breath felt very weak has some chest discomfort and came into the hospital his EKG shows sinus rhythm with nonspecific changes no acute ST segment elevation  The patient after he was  seen in [place] because of his chronic renal failure he was transferred in here the patient had elevated troponin and the patient tremor supposed to get dialysis in here but he refused dialysis refused to stay any further and the patient was to go back home to his hospital in [place] to get his further care patient understand that he is at risk of the having more complication was possible pulmonary edema congestive heart failure even death if he does not do his dialysis is soon his potassium was good 4.5 and the patient still insists on going      ROS:   Constitutional: Negative for fever. Negative for chills.   Skin: Negative for rash or other lesions.   HEENT: Negative for headaches.   Eyes: Negative for blurred vision and double vision.   Cardiovascular:  Had chest pain and no palpitations.   Respiratory: Negative for cough. Is  experiencing shortness of breath. No wheezing.  Gastrointestinal: Negative for nausea, vomiting and abdominal pain. No diarrhea, constipation or hematochezia  Genitourinary: Negative for dysuria, urgency, frequency, or hematuria.   Musculoskeletal:  Had back pain.   Neurological: Negative for dizziness and loss of consciousness.  No numbness, tingling, or other sensation changed.  Psych:  Denies anxiety or depression.  All other systems reviewed and are negative.  Past Medical History:   Diagnosis Date   . Ascites    .  Asthma    . Clostridium difficile infection 08/28/2016    +  C Diff 08/2016; C.diff + 10/07/16   . Dialysis patient (CMS HCC)    . HTN (hypertension)    . Kidney failure    . MI, old          Past Surgical History:   Procedure Laterality Date   . AV FISTULA PLACEMENT     . HX CORONARY STENT PLACEMENT     . HX HERNIA REPAIR           Prior to Admission Medications:  Medications Prior to Admission     Prescriptions    albuterol sulfate 90 mcg/Actuation Inhalation HFA Aerosol Inhaler oral inhaler    Take 1-2 Puffs by inhalation Every 6 hours as needed    carvedilol (COREG) 25  mg Oral Tablet    Take 25 mg by mouth Twice daily with food    furosemide (LASIX) 80 mg Oral Tablet    Take 80 mg by mouth Once a day    hydrALAZINE (APRESOLINE) 100 mg Oral Tablet    Take 100 mg by mouth Three times a day    sevelamer (RENAGEL) 800 mg Oral Tablet    Take 800 mg by mouth Three times daily with meals        Inpatient Medications:    Current Facility-Administered Medications:  acetaminophen (TYLENOL) tablet 650 mg Oral Once   acetaminophen (TYLENOL) tablet 650 mg Oral Q6H PRN   albuterol (PROVENTIL) 2.5 mg / 3 mL (0.083%) neb solution 3 mL Nebulization Q6H PRN   aluminum-magnesium hydroxide-simethicone (MAG-AL PLUS) 200-200-20 mg per 5 mL oral liquid 20 mL Oral Q4H PRN   aluminum-magnesium hydroxide-simethicone (MAG-AL PLUS) 200-200-20 mg per 5 mL oral liquid 30 mL Oral Q4H PRN   carvedilol (COREG) tablet 25 mg Oral 2x/day-Food   dextrose 50% (0.5 g/mL) injection - syringe 25 g Intravenous Q15 Min PRN   diphenhydrAMINE (BENADRYL) capsule 50 mg Oral Once   furosemide (LASIX) tablet 80 mg Oral Daily   hydrALAZINE (APRESOLINE) injection 5 mg 5 mg Intravenous Q4H PRN   hydrALAZINE (APRESOLINE) tablet 100 mg Oral Q8HRS   nitroGLYCERIN (NITROSTAT) sublingual tablet 0.4 mg Sublingual Q5 Min PRN   NS bolus infusion 40 mL 40 mL Intravenous Once PRN   NS flush syringe 3 mL Intracatheter Q8HRS   NS flush syringe 3 mL Intracatheter Q1H PRN   ondansetron (ZOFRAN) 2 mg/mL injection 4 mg Intravenous 4x/day PRN   [START ON 12/21/2016] pantoprazole (PROTONIX) delayed release tablet 40 mg Oral Daily before Breakfast   polyethylene glycol (MIRALAX) oral packet 17 g Oral Daily PRN   promethazine (PHENERGAN) 25 mg/mL IV injection 12.5 mg Intravenous Q6H PRN   sevelamer (RENAGEL) tablet 800 mg Oral 3x/day-Meals   SSIP insulin lispro (HUMALOG) 100 units/mL injection 1-12 Units Subcutaneous 4x/day AC     Allergies   Allergen Reactions   . Lisinopril      Lip swelling     . Morphine      Throat swelling. Has gotten dilaudid  without issue.      . Toradol [Ketorolac]      Breathing issues         Family History  Family Medical History     Problem Relation (Age of Onset)    Healthy Father    Hypertension Mother              Social History  Social History     Social History   .  Marital status: Divorced     Spouse name: N/A   . Number of children: N/A   . Years of education: N/A     Occupational History   . Not on file.     Social History Main Topics   . Smoking status: Current Every Day Smoker     Packs/day: 0.25     Types: Cigarettes   . Smokeless tobacco: Never Used   . Alcohol use No   . Drug use: No   . Sexual activity: Not on file     Other Topics Concern   . Not on file     Social History Narrative       Objective     Exam:  Filed Vitals:    12/20/16 1100   BP: (!) 170/110   Pulse: 73   Resp: 18   Temp: 36.8 C (98.2 F)   SpO2: 100%     GENERAL: Patient is alert and oriented x3, not in acute distress. Mildly dyspneic   HEENT: Head is normocephalic atraumatic. Eyes: Anicteric. PERRLA, EOMI.   NECK: No JVD or bruit. Carotid upstroke within normal limits. No lymphadenopathy or thyromegaly.   LUNGS: Clear bilaterally. No rhonchi, rales or wheezing.   HEART: Regular rate and rhythm. No gallop, had 1/6 systolic murmur. No lift, no heaves. PMI was not appreciated. Pulses are intact.   ABDOMEN: Soft, nontender. No masses or organomegaly. Bowel sounds positive. No rebound, no guarding.   EXTREMETIES: No cyanosis, clubbing   Had 1+ edema.   NEUROLOGIC: Grossly within normal limits. Cranial nerves 2-12 are within normal limits. No focal finding. Speech is normal.   Femoral and distal pulses are intact.   LYMPH NODE: No adenopathy.   PSYCHIATRIC: Normal affect. Alert and oriented x3.   SKIN: No rash.   BACK: No CVA tenderness.   Labs:   Results for orders placed or performed during the hospital encounter of 12/20/16 (from the past 24 hour(s))   C. DIFFICILE PCR TESTING - [facility] ONLY   Result Value Ref Range     CLOSTRIDIUM DIFFICILE TOXIN, PCR Positive (AA) Negative, Indeterminate    PRESUMPTIVE 027/NAP1/BI Positive (A) Not Detected, Negative   ECG 12 LEAD - ADULT   Result Value Ref Range    Heart Rate 77 BPM    PR Interval 201 ms    QRS Duration 103 ms    QT Interval 450 ms    QTC Calculation 510 ms    Calculated P Axis 66 deg    QRS Axis 4 deg    Calculated T Axis 79 deg    I 40 Axis 44 deg    T 40 Axis -30 deg    ST Axis 22 deg    EKG Severity - ABNORMAL ECG -    TROPONIN-I   Result Value Ref Range    TROPONIN I 0.06 (HH) <=0.04 ng/mL   TYPE AND CROSS RED CELLS NON IRRADIATED   Result Value Ref Range    UNITS ORDERED 2     SPECIMEN EXPIRATION DATE 12/23/2016     ABO/RH(D) O POSITIVE     ANTIBODY SCREEN NEGATIVE           Imaging Studies:    No orders to display           Rosie Fate, MD  12/20/2016, 14:26      This note may have been partially generated using MModal Fluency Direct system, and there may be some incorrect words, spellings, and  punctuation that were not noted in checking the note before saving.

## 2016-12-21 DIAGNOSIS — R9431 Abnormal electrocardiogram [ECG] [EKG]: Secondary | ICD-10-CM

## 2016-12-21 NOTE — H&P (Signed)
PATIENT NAME: [name], [name]  [name] NUMBER:  [id number]  DATE OF SERVICE:   DATE OF BIRTH:  [birth year]    HISTORY AND PHYSICAL    CHIEF COMPLAINT:  Chest pain.  Diarrhea.    HISTORY OF PRESENT ILLNESS:  The patient is a 46 year old male who comes in chest pain, a pressure-like sensation in the substernal area.  The patient states the chest pain started this morning, radiating to his left shoulder and left chest, moderate in intensity.  The patient stated symptoms by exertion, relieved by nothing.  The patient also had nausea, vomiting, and diarrhea.  The patient stated the symptoms have gone on for the past 2 or 3 days, feeling tired, weak.  The patient was supposed to get dialysis today but did not go due to weakness.  The patient having shortness of breath, weak, tired.  The patient was found to have a hemoglobin of 7.5 at [name].  The patient was transferred here for further evaluation.  The patient stated does have chronic anemia due to chronic kidney disease and gets frequent blood transfusions in [name] where he lives.  He stated his symptoms got worse, so he decided to come in.    REVIEW OF SYSTEMS:  The patient feeling weak, tired.  Denies any ears, eyes, nose, throat problems.  The patient did have chest pain. Denies any palpitations.  Has shortness of breath.  Denies any wheezing coughing.  The does have nausea, vomiting, and diarrhea.  Denies any increased frequency or urgency.  All other review of systems reviewed and negative.    ALLERGIES:  1. LISINOPRIL.  2. MORPHINE.   3. TORADOL.    MEDICATIONS:  1. Coreg.  2. Lasix.  3. Norvasc.  4. Renagel.   5. Hydralazine.    PAST MEDICAL HISTORY:  History of chronic kidney disease, end-stage renal disease on dialysis, hypertension, coronary artery disease, asthma.    PAST SURGICAL HISTORY:  1. The patient had cardiac stents placed x2.   2. Fistula placement.  3. Inguinal hernia repair.   4. Umbilical hernia.    SOCIAL HISTORY:   The patient is a current every day smoker.  Denies any alcohol or illicit drug use.    FAMILY HISTORY:  Hypertension.    PHYSICAL EXAMINATION:  General:  No acute distress.  Temperature 36.8, pulse 73, respiratory rate 18, blood pressure 170/110, FiO2 100%.  General:  No acute distress.  Head:  Normocephalic, atraumatic.  Eyes:  PERRLA.  Extraocular muscles intact.  Neck:  Supple with no thyromegaly or adenopathy.  Heart:  Regular rate and rhythm.  Lungs:  Clear to auscultation bilaterally.  Abdomen:  Soft, nontender, nondistended.  Positive bowel sounds.  Extremities:  No edema.  Skin:  Warm and dry.  Neurological:  Cranial nerves II through XII intact.  Alert and oriented x3.  No lymphadenopathy.    LABORATORY AND X-RAY DATA:  Clostridium difficile was positive.  Troponin was 0.06.  White blood cells 5200 at [facility].  Hemoglobin 7.5, hematocrit 24.2, platelets 126,000.  INR 1.1.  Sodium 137, potassium 5, chloride 103, bicarb 18, BUN 62, creatinine 11, ALT 12.  Alkaline phosphatase 238.  Troponin was 0.06.  CK-MB was 1483.    ASSESSMENT/PLAN:  1. Anemia, most likely due to chronic kidney disease.  Will Hemoccult stool.  We will continue to monitor patient.  We will transfuse 2 units of blood.  2. Elevated troponin with chest pain.  We will consult Dr.  A. [name].  Will continue to monitor the patient.   3. End-stage renal disease.  We will continue dialysis.  4. Hypertension.  Continue medication.  5. GERD.  Will start Protonix.  6. DVT prophylaxis.  Start heparin.   7. Positive Clostridium difficile.  Start Flagyl.  We will continue to monitor the patient.  8. Asthma.  Continue medication.   9. Coronary artery disease.  Continue medication.  We will continue the patient the patient.        Todd HaroldMohamed A Sabbagh, MD                DD:  12/20/2016 23:15:21  DT:  12/21/2016 07:52:32 JG  D#:  [id number]

## 2016-12-21 NOTE — Discharge Summary (Signed)
PATIENT NAME: [name], [name]  HOSPITAL NUMBER:  [id number]  ADMIT DATE:  12/20/2016  DISCHARGE DATE:  12/20/2016  DATE OF BIRTH:  Sep 09, 1970    DISCHARGE SUMMARY      DISCHARGE SUMMARY:  Please see the previously dictated history and physical done today.      The patient was admitted.  Troponins were borderline elevated.  The patient was also found to have Clostridium difficile.  Started on Flagyl.  The patient was also recommended to get 2 units of blood, but then the patient got agitated and stated he wanted to leave against medical advice.  The patient was recommended to stay to continue to monitor the patient and get a 2D echo and a stress test in the morning.  The patient was also recommended due to get blood, but the patient refused and left AMA.  The patient was recommended to stay and get dialysis also since the patient missed dialysis.  This was to happen today but the patient refused and left AMA.        Genia HaroldMohamed A Sabbagh, MD            DD:  12/20/2016 23:17:30  DT:  12/21/2016 07:55:55 JG  [name]#:  [id number]

## 2016-12-23 ENCOUNTER — Inpatient Hospital Stay
Admit: 2016-12-23 | Discharge: 2016-12-23 | Discharge status: Against Medical Advice | Attending: Internal Medicine | Admitting: Internal Medicine

## 2016-12-23 ENCOUNTER — Emergency Department: Admit: 2016-12-23 | Payer: MEDICARE

## 2016-12-23 ENCOUNTER — Inpatient Hospital Stay
Admit: 2016-12-23 | Discharge: 2016-12-23 | Discharge status: Against Medical Advice | Payer: MEDICARE | Attending: Emergency Medicine

## 2016-12-23 DIAGNOSIS — R079 Chest pain, unspecified: Secondary | ICD-10-CM

## 2016-12-23 DIAGNOSIS — A0472 Enterocolitis due to Clostridium difficile, not specified as recurrent: Secondary | ICD-10-CM

## 2016-12-23 LAB — CBC WITH AUTO DIFFERENTIAL
Absolute Baso #: 0.1 10*3/uL (ref 0.0–0.2)
Absolute Eos #: 0.6 10*3/uL — ABNORMAL HIGH (ref 0.0–0.5)
Absolute Lymph #: 0.3 10*3/uL — ABNORMAL LOW (ref 1.0–4.3)
Absolute Mono #: 0.4 10*3/uL (ref 0.0–0.8)
Absolute Neut #: 3.4 10*3/uL (ref 1.8–7.0)
Basophils %: 1 % (ref 0.0–2.0)
Basophils Absolute: 0.06 E9/L (ref 0.00–0.20)
Basophils: 1.1 % (ref 0.0–2.0)
Eosinophils %: 21 % — ABNORMAL HIGH (ref 0.0–6.0)
Eosinophils Absolute: 1.2 E9/L — ABNORMAL HIGH (ref 0.05–0.50)
Eosinophils: 12.6 % — ABNORMAL HIGH (ref 1.0–6.0)
Granulocytes %: 71.5 % (ref 40.0–80.0)
Hematocrit: 21.5 % — ABNORMAL LOW (ref 40.0–52.0)
Hematocrit: 25.3 % — ABNORMAL LOW (ref 37.0–54.0)
Hemoglobin: 7 g/dL — ABNORMAL LOW (ref 13.0–18.0)
Hemoglobin: 7.9 g/dL — ABNORMAL LOW (ref 12.5–16.5)
Lymphocyte %: 6.6 % — ABNORMAL LOW (ref 20.0–40.0)
Lymphocytes %: 6 % — ABNORMAL LOW (ref 20.0–42.0)
Lymphocytes Absolute: 0.34 E9/L — ABNORMAL LOW (ref 1.50–4.00)
MCH: 30.5 pg (ref 26.0–34.0)
MCH: 31 pg (ref 26.0–35.0)
MCHC: 32.6 % (ref 32.0–36.0)
MCHC: 31.2 % — ABNORMAL LOW (ref 32.0–34.5)
MCV: 93.5 fL (ref 80.0–98.0)
MCV: 99.2 fL (ref 80.0–99.9)
MPV: 8.2 fL (ref 7.4–10.4)
MPV: 10.2 fL (ref 7.0–12.0)
Monocytes %: 3 % (ref 2.0–12.0)
Monocytes Absolute: 0.17 E9/L (ref 0.10–0.95)
Monocytes: 8.2 % (ref 2.0–10.0)
Neutrophils %: 69 % (ref 43.0–80.0)
Neutrophils Absolute: 3.93 E9/L (ref 1.80–7.30)
Platelets: 139 10*3/uL — ABNORMAL LOW (ref 140–440)
Platelets: 129 E9/L — ABNORMAL LOW (ref 130–450)
RBC: 2.3 10*6/uL — ABNORMAL LOW (ref 4.40–5.90)
RBC: 2.55 E12/L — ABNORMAL LOW (ref 3.80–5.80)
RDW: 16.3 % — ABNORMAL HIGH (ref 11.5–14.5)
RDW: 16.8 fL — ABNORMAL HIGH (ref 11.5–15.0)
WBC: 4.8 10*3/uL (ref 3.6–10.7)
WBC: 5.7 E9/L (ref 4.5–11.5)
nRBC: 1 /100 WBC

## 2016-12-23 LAB — BASIC METABOLIC PANEL
Anion Gap: 9 NA
BUN: 50 mg/dL — ABNORMAL HIGH (ref 7–20)
CO2: 24 mmol/L (ref 22–30)
Calcium: 6.6 mg/dL — ABNORMAL LOW (ref 8.4–10.4)
Chloride: 101 mmol/L (ref 98–107)
Creatinine: 9.58 mg/dL — ABNORMAL HIGH (ref 0.52–1.25)
EGFR IF NonAfrican American: 5.9 mL/min (ref >60)
Glucose: 82 mg/dL (ref 70–100)
Potassium: 4.8 mmol/L (ref 3.5–5.1)
Sodium: 134 mmol/L — ABNORMAL LOW (ref 137–145)
eGFR African American: 7.2 mL/min (ref >60)

## 2016-12-23 LAB — BRAIN NATRIURETIC PEPTIDE: NT Pro-BNP: 103554 pg/mL — ABNORMAL HIGH (ref 0–125)

## 2016-12-23 LAB — PHOSPHORUS: Phosphorus: 5.6 mg/dL — ABNORMAL HIGH (ref 2.5–4.5)

## 2016-12-23 LAB — COMPREHENSIVE METABOLIC PANEL
ALT: 10 U/L (ref 0–40)
AST: 17 U/L (ref 0–39)
Albumin: 3.8 g/dL (ref 3.5–5.2)
Alkaline Phosphatase: 279 U/L — ABNORMAL HIGH (ref 40–129)
Anion Gap: 19 mmol/L — ABNORMAL HIGH (ref 7–16)
BUN: 52 mg/dL — ABNORMAL HIGH (ref 6–20)
CO2: 22 mmol/L (ref 22–29)
Calcium: 6.5 mg/dL — ABNORMAL LOW (ref 8.6–10.2)
Chloride: 100 mmol/L (ref 98–107)
Creatinine: 10.2 mg/dL (ref 0.7–1.2)
GFR African American: 7
GFR Non-African American: 7 mL/min/{1.73_m2} (ref >=60)
Glucose: 96 mg/dL (ref 74–109)
Potassium: 5.1 mmol/L — ABNORMAL HIGH (ref 3.5–5.0)
Sodium: 141 mmol/L (ref 132–146)
Total Bilirubin: 0.5 mg/dL (ref 0.0–1.2)
Total Protein: 6.7 g/dL (ref 6.4–8.3)

## 2016-12-23 LAB — HEPATIC FUNCTION PANEL
ALT: 27 U/L (ref 13–69)
AST: 27 U/L (ref 15–46)
Albumin,Serum: 3.7 g/dL (ref 3.5–5.0)
Alkaline Phosphatase: 229 U/L — ABNORMAL HIGH (ref 38–126)
Bilirubin, Direct: 0 mg/dL (ref 0.0–0.3)
Total Bilirubin: 0.5 mg/dL (ref 0.2–1.3)
Total Protein: 6.1 g/dL — ABNORMAL LOW (ref 6.3–8.2)

## 2016-12-23 LAB — LIPASE
Lipase: 119 U/L (ref 23–300)
Lipase: 46 U/L (ref 13–60)

## 2016-12-23 LAB — BLOOD OCCULT STOOL SCREEN #1: Occult Blood Fecal: NEGATIVE NA

## 2016-12-23 LAB — TYPE AND SCREEN
Antibody Screen: NEGATIVE NA
Rh Type: POSITIVE NA

## 2016-12-23 LAB — IRON AND TIBC
Iron: 105 ug/dL (ref 49–181)
Sat: 42 % (ref 15–50)
TIBC: 252 ug/dL — ABNORMAL LOW (ref 261–497)

## 2016-12-23 LAB — TROPONIN
Troponin I: 0.063 ng/mL — ABNORMAL HIGH (ref 0.000–0.034)
Troponin I: 0.073 ng/mL — ABNORMAL HIGH (ref 0.000–0.034)
Troponin: 0.95 ng/mL — ABNORMAL HIGH (ref 0.00–0.03)

## 2016-12-23 LAB — PROTIME-INR
INR: 1.1
Protime: 12.4 s (ref 9.3–12.4)

## 2016-12-23 LAB — APTT: aPTT: 33.4 s (ref 24.5–35.1)

## 2016-12-23 LAB — PREPARE RBC (CROSSMATCH)
Blood Type: 5100 NA
Dispense Status Blood Bank: TRANSFUSED NA
Expiration Date: 201809122359 NA

## 2016-12-23 LAB — FOLATE: Folate: 8.7 ng/mL (ref 2.8–20.0)

## 2016-12-23 LAB — FERRITIN: Ferritin: 1120 ng/mL — ABNORMAL HIGH (ref 18–464)

## 2016-12-23 LAB — TRANSFERRIN: Transferrin: 192 mg/dL — ABNORMAL LOW (ref 206–381)

## 2016-12-23 LAB — MAGNESIUM: Magnesium: 1.9 mg/dL (ref 1.6–2.6)

## 2016-12-23 LAB — LACTIC ACID
Lactic Acid: <0.5 mmol/L — ABNORMAL LOW (ref 0.7–2.0)
Lactic Acid: 1.3 mmol/L (ref 0.5–2.2)

## 2016-12-23 LAB — VITAMIN B12: Vitamin B-12: 310 pg/mL (ref 239–931)

## 2016-12-23 MED ORDER — ONDANSETRON HCL 4 MG/2ML IJ SOLN
4 MG/2ML | Freq: Once | INTRAMUSCULAR | Status: AC
Start: 2016-12-23 — End: 2016-12-23
  Administered 2016-12-23: 17:00:00 4 mg via INTRAVENOUS

## 2016-12-23 MED ORDER — MORPHINE SULFATE (PF) 2 MG/ML IV SOLN
2 | INTRAVENOUS | Status: DC | PRN
Start: 2016-12-23 — End: 2016-12-23

## 2016-12-23 MED ORDER — ONDANSETRON HCL 4 MG/2ML IJ SOLN
4 MG/2ML | Freq: Once | INTRAMUSCULAR | Status: AC
Start: 2016-12-23 — End: 2016-12-23
  Administered 2016-12-23: 07:00:00 4 mg via INTRAVENOUS

## 2016-12-23 MED ORDER — SODIUM CHLORIDE 0.9 % IV BOLUS
0.9 % | Freq: Once | INTRAVENOUS | Status: DC
Start: 2016-12-23 — End: 2016-12-23

## 2016-12-23 MED ORDER — HYDRALAZINE HCL 50 MG PO TABS
50 MG | Freq: Three times a day (TID) | ORAL | Status: DC
Start: 2016-12-23 — End: 2016-12-23

## 2016-12-23 MED ORDER — ONDANSETRON HCL 4 MG/2ML IJ SOLN
4 MG/2ML | Freq: Four times a day (QID) | INTRAMUSCULAR | Status: DC | PRN
Start: 2016-12-23 — End: 2016-12-23
  Administered 2016-12-23: 12:00:00 4 mg via INTRAVENOUS

## 2016-12-23 MED ORDER — FENTANYL CITRATE (PF) 100 MCG/2ML IJ SOLN
100 MCG/2ML | Freq: Once | INTRAMUSCULAR | Status: AC
Start: 2016-12-23 — End: 2016-12-23
  Administered 2016-12-23: 17:00:00 50 ug via INTRAVENOUS

## 2016-12-23 MED ORDER — ALBUTEROL SULFATE (2.5 MG/3ML) 0.083% IN NEBU
Freq: Once | RESPIRATORY_TRACT | Status: AC
Start: 2016-12-23 — End: 2016-12-23
  Administered 2016-12-23: 06:00:00 2.5 mg via RESPIRATORY_TRACT

## 2016-12-23 MED ORDER — HYDROMORPHONE HCL 1 MG/ML IJ SOLN
1 MG/ML | Freq: Once | INTRAMUSCULAR | Status: AC
Start: 2016-12-23 — End: 2016-12-23
  Administered 2016-12-23: 11:00:00 1 mg via INTRAVENOUS

## 2016-12-23 MED ORDER — IPRATROPIUM-ALBUTEROL 0.5-2.5 (3) MG/3ML IN SOLN
RESPIRATORY_TRACT | Status: DC | PRN
Start: 2016-12-23 — End: 2016-12-23

## 2016-12-23 MED ORDER — HYDRALAZINE HCL 20 MG/ML IJ SOLN
20 MG/ML | Freq: Once | INTRAMUSCULAR | Status: AC
Start: 2016-12-23 — End: 2016-12-23
  Administered 2016-12-23: 18:00:00 20 mg via INTRAVENOUS

## 2016-12-23 MED ORDER — ASPIRIN 81 MG PO CHEW
81 MG | Freq: Once | ORAL | Status: AC
Start: 2016-12-23 — End: 2016-12-23
  Administered 2016-12-23: 09:00:00 324 mg via ORAL

## 2016-12-23 MED ORDER — NITROGLYCERIN 0.4 MG SL SUBL
0.4 MG | SUBLINGUAL | Status: DC | PRN
Start: 2016-12-23 — End: 2016-12-23
  Administered 2016-12-23: 07:00:00 0.4 mg via SUBLINGUAL

## 2016-12-23 MED ORDER — IPRATROPIUM BROMIDE 0.02 % IN SOLN
0.02 % | Freq: Once | RESPIRATORY_TRACT | Status: AC
Start: 2016-12-23 — End: 2016-12-23
  Administered 2016-12-23: 07:00:00 0.5 mg via RESPIRATORY_TRACT

## 2016-12-23 MED ORDER — NITROGLYCERIN 0.4 MG SL SUBL
0.4 | SUBLINGUAL | Status: DC | PRN
Start: 2016-12-23 — End: 2016-12-23

## 2016-12-23 MED ORDER — OXYCODONE-ACETAMINOPHEN 5-325 MG PO TABS
5-325 MG | ORAL | Status: DC | PRN
Start: 2016-12-23 — End: 2016-12-23

## 2016-12-23 MED ORDER — METRONIDAZOLE 500 MG PO TABS
500 MG | ORAL_TABLET | Freq: Three times a day (TID) | ORAL | 0 refills | Status: AC
Start: 2016-12-23 — End: 2017-01-02

## 2016-12-23 MED ORDER — SEVELAMER CARBONATE 800 MG PO TABS
800 MG | Freq: Three times a day (TID) | ORAL | Status: DC
Start: 2016-12-23 — End: 2016-12-23

## 2016-12-23 MED ORDER — PERFLUTREN LIPID MICROSPHERE IV SUSP
Freq: Once | INTRAVENOUS | Status: DC | PRN
Start: 2016-12-23 — End: 2016-12-23

## 2016-12-23 MED ORDER — NORMAL SALINE FLUSH 0.9 % IV SOLN
0.9 | INTRAVENOUS | Status: DC | PRN
Start: 2016-12-23 — End: 2016-12-23

## 2016-12-23 MED ORDER — MAGNESIUM HYDROXIDE 400 MG/5ML PO SUSP
400 MG/5ML | Freq: Every day | ORAL | Status: DC | PRN
Start: 2016-12-23 — End: 2016-12-23

## 2016-12-23 MED ORDER — NORMAL SALINE FLUSH 0.9 % IV SOLN
0.9 % | INTRAVENOUS | Status: DC | PRN
Start: 2016-12-23 — End: 2016-12-23

## 2016-12-23 MED ORDER — NORMAL SALINE FLUSH 0.9 % IV SOLN
0.9 | Freq: Three times a day (TID) | INTRAVENOUS | Status: DC
Start: 2016-12-23 — End: 2016-12-23
  Administered 2016-12-23: 07:00:00 3 mL via INTRAVENOUS

## 2016-12-23 MED ORDER — ASPIRIN 81 MG PO CHEW
81 MG | Freq: Every day | ORAL | Status: DC
Start: 2016-12-23 — End: 2016-12-23

## 2016-12-23 MED ORDER — NORMAL SALINE FLUSH 0.9 % IV SOLN
0.9 % | Freq: Two times a day (BID) | INTRAVENOUS | Status: DC
Start: 2016-12-23 — End: 2016-12-23

## 2016-12-23 MED ORDER — ACETAMINOPHEN 325 MG PO TABS
325 MG | ORAL | Status: DC | PRN
Start: 2016-12-23 — End: 2016-12-23

## 2016-12-23 MED ORDER — HYDROMORPHONE HCL 1 MG/ML IJ SOLN
1 MG/ML | Freq: Once | INTRAMUSCULAR | Status: AC
Start: 2016-12-23 — End: 2016-12-23
  Administered 2016-12-23: 09:00:00 1 mg via INTRAVENOUS

## 2016-12-23 MED FILL — RENVELA 800 MG PO TABS: 800 MG | ORAL | Qty: 1

## 2016-12-23 MED FILL — ONDANSETRON HCL 4 MG/2ML IJ SOLN: 4 MG/2ML | INTRAMUSCULAR | Qty: 2

## 2016-12-23 MED FILL — FENTANYL CITRATE (PF) 100 MCG/2ML IJ SOLN: 100 MCG/2ML | INTRAMUSCULAR | Qty: 2

## 2016-12-23 MED FILL — HYDRALAZINE HCL 50 MG PO TABS: 50 MG | ORAL | Qty: 2

## 2016-12-23 MED FILL — ALBUTEROL SULFATE (2.5 MG/3ML) 0.083% IN NEBU: RESPIRATORY_TRACT | Qty: 3

## 2016-12-23 MED FILL — NITROSTAT 0.4 MG SL SUBL: 0.4 MG | SUBLINGUAL | Qty: 25

## 2016-12-23 MED FILL — ASPIRIN 81 MG PO CHEW: 81 MG | ORAL | Qty: 4

## 2016-12-23 MED FILL — HYDRALAZINE HCL 20 MG/ML IJ SOLN: 20 MG/ML | INTRAMUSCULAR | Qty: 1

## 2016-12-23 MED FILL — DILAUDID 1 MG/ML IJ SOLN: 1 MG/ML | INTRAMUSCULAR | Qty: 1

## 2016-12-23 MED FILL — IPRATROPIUM BROMIDE 0.02 % IN SOLN: 0.02 % | RESPIRATORY_TRACT | Qty: 2.5

## 2016-12-23 NOTE — ED Notes (Signed)
Critical lab reported: Creatinine of 10.2.  Dr Durwin RegesAiadtoss notified.     Rob Buntingegina Anuradha Chabot, RN  12/23/16 [phone]

## 2016-12-23 NOTE — Procedures (Signed)
11:55 Patient refused bedside Venous Duplex of the bilateral lower extremities - Diagnosis edema. Patient's refusal was verified by RN Eunice Blaseebbie.

## 2016-12-23 NOTE — Other (Unsigned)
Patient Acct Nbr: [id number]   Primary AUTH/CERT:   Primary Insurance Company Name: [employer]  Primary Insurance Plan name: Medicare A  Primary Insurance Group Number:   Primary Insurance Plan Type: [employer]  Primary Insurance Policy Number: [id number]    Secondary AUTH/CERT:   Secondary Insurance Company Name: [employer]  Secondary Insurance Plan name: Medicare B  Secondary Insurance Group Number:   Secondary Insurance Plan Type: [employer] B  Secondary Insurance Policy Number: [id number]    Tertiary AUTH/CERT:   [employer]iary Insurance Company Name: [employer]  Tertiary Insurance Plan name: [employer]  Tertiary Insurance Group Number:   [employer]iary Insurance Plan Type: [employer]  Tertiary Insurance Policy Number: [id number]

## 2016-12-23 NOTE — Discharge Summary (Signed)
Hospitalist Discharge Summary    David [name]  DOB:  Apr 24, 1970  MRN:  [id number]    Admit date:  12/23/2016  Discharge date:  12/23/2016    Admitting Physician:  Vilinda FlakeGeorge Kasondra Junod, DO  Primary Care Physician:  No primary care provider on file.  Visit Status: Admission  Code Status: Full Code    Discharge Diagnoses:    1. 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 Volume overload  3. Chest pain  4. Indeterminate troponin in setting of ESRD  5. Diarrhea, recent c.diff infection  6. Acute/chronic anemia  7. Obesity  8. B/L LE edema      Diagnosis Date   . Asthma    . Dialysis patient (HCC)    . Hypertension      Procedures: blood transfusion(1 of 2 units)    Hospital Course:  David [name] is a 46 y.o. male with past medical history below who presents with chest pain and diarrhea. Recent hospital admission, treated for pneumonia. Also treated for c.diff within the past month. Completed vancomycin dose and diarrhea ceased. The patient had return of diarrhea Monday. Yesterday the patient developed chest pain. Describes it as a sensation like something is sitting on his chest. Located on the LEFT heart of his chest and radiating to his LEFT arm and jaw. Chest pain is associated with difficulty breathing. He is a dialysis patient (MWF)  But missed his Weds session. Patient is moving here from out of town and does not have any doctors or established dialysis centers. Denies abdominal pain, nausea, vomiting, constipation, fevers, or chills. Patient was admitted however decided to leave AMA because he felt that his pain wasn't being adequately controled. Explained risk of leaving against medical advice and patient is accepting of risk.    Consults:  IP CONSULT TO NEPHROLOGY    Discharge Instructions:   Diet: DIET CARDIAC;   Activity: as tolerated   Recommended Outpatient Tests:    Disposition:   Left AMA    Vitals: BP (!) 140/99   Pulse 85   Temp 98.3 F (36.8 C) (Temporal)   Resp 19   SpO2 99%   Pulse Ox: SpO2  Avg: 99.3 %  Min: 97 %  Max: 100 %  Supplemental  O2: O2 Flow Rate (L/min): 3 L/min  General appearance:  No apparent distress, appears stated age and cooperative with exam  HEENT:  Normal cephalic, atraumatic without obvious deformity. Pupils equal, round, and reactive to light.  Extra ocular muscles intact. Conjunctivae/corneas clear.  Neck: Supple, with full range of motion. No jugular venous distention. Trachea midline. No lymphadenopathy.  Respiratory: diminished at bases bilaterally  Cardiovascular:  Regular rate and rhythm with normal S1/S2 without murmurs, rubs or gallops.  Abdomen: Soft, non-tender, non-distended with normal bowel sounds. No rebound or guarding.  Musculoskeletal: bilateral LE +1 pitting edema  Skin: Skin color, texture, turgor normal.  No rashes or lesions.  Neurologic:  Neurovascularly intact without any focal sensory/motor deficits. Cranial nerves: II-XII intact, grossly non-focal.    Complexity of Follow up:   []  Moderate Complexity: follow up within 7-14 calendar days (04540(99495)   [x]  Severe Complexity: follow up within 7 calendar days ([id number](99496)    Needs to fu with new PCP and nephrologist in next 1-2 days    Signed:  Glennie HawkGeorge Aaliyah Cancro,DO  Division of Hospitalist Medicine  Inpatient Medical Services  12/23/2016, 11:07 AM

## 2016-12-23 NOTE — ED Notes (Signed)
Pt tolerating blood transfusion without difficulty.  Denies any SOB or chest pain.  Resp even and unlabored.  Pt to be transferred to floor.     Cleotis Lemarystal Ashley, RN  12/23/16 [phone]

## 2016-12-23 NOTE — Progress Notes (Signed)
Blood transfusion completed.  Refuses any vitals or further treatment. Blood bank notified.  States is leaving AMA.  Dr. Gabriel CirriIlodi notified.Dr. Thedore MinsSingh notified.  IV discontinued.

## 2016-12-23 NOTE — ED Provider Notes (Addendum)
I independently performed a history and physical on David [name].   All diagnostic, treatment, and disposition decisions were made by myself in conjunction with the advanced practice provider/resident.     Brief History of Present Illness:   Patient presents for evaluation of multiple complaints first complaint is diarrhea starting on Monday. Was recent hospitalized in [place] is from. Head been treated for pneumonia and subsequently antibiotics cause diarrhea and C. difficile infection. Completed vancomycin dose and diarrhea ceased. The patient had return of diarrhea Monday. A green liquid colored stool has been persistent 7 times per day since then.Associated with nausea vomiting ??5-6 episodes since Monday.   Yesterday the patient developed chest pain. Describes it as a sensation like something is sitting on his chest. Located on the LEFT heart of his chest and radiating to his LEFT arm and jaw. Chest pain is associated with difficulty breathing. He is a dialysis patient and Mrs. dialysis yesterday. Last dialysis was Monday in [place]. Patient has chronic leg swelling which is actually worse than usual. Patient no longer urinates. Patient is moving here from out of town and does not have any doctors or established dialysis centers.      Focused Physical Examination:    Triage vitals signs indicate no hypotension, tachycardia, tachypnea, hypoxia, or fever. Regular rate and rythm without murmur. mild expiratory wheeze. No increased work of breathing. +1 pitting edema bilateral lower extremities. Abdomen is soft. A dialysis catheter access LEFT chest wall.    Emergency Department Course and Medical Decision Making:   Patient presents for evaluation of above. The patient is treated with a breathing treatment for his wheezing. Cardiac workup was initiated. Patient was treated initially with nitroglycerin, Zofran and breathing treatments.    Interpretation of 12 Lead EKG obtained at 0103 has a regular rhythm  at a rate of 90 bpm.PR interval normal.  QRS complex narrow.  QTC normal.  No ST segment elevations or depressions.  Flat T waves in lead 1 and T-wave inversion in aVL.  Good R-wave progression through precordial leads.  Interpretation of this EKG myself in the absence of a cardiologist is normal sinus rhythm and ST-T wave abnormality concerning for anterior ischemia.    Interpretation of Diagnostic Laboratory Studies and Tests: lactic acid normal. Mild elevation alkaline phosphatase 229. CBC without white count abnormality. Anemia 7.0. Platelet number thrombocytopenic with 139. Metabolic panel consistent with end-stage renal disease. Potassium was normal.    Patient had rectal examination and Hemoccult testing. The revealed a green colored stool. Patient was consented for blood and was transfused 1 unit as he is complaining of chest pain with significant anemia. NT proBNP was elevated 103,000 but x-ray showed atelectasis and no significant vascular congestion. The patient will be admitted to the hospital for dialysis and further treatment of his chest pain as I suspect this is secondary to missed dialysis and heart failure. Also will be admitted for further treatment of his anemia. Admitted to the IMS service for further evaluation.    For further details of [facility] emergency department encounter, please see documentation by advanced practice provider/resident  Danella MaiersBeth Kingzett, CNP.    Comment: Please note this report has been produced using speech recognition software and may contain errors related to that system including errors in grammar, punctuation, and spelling, as well as words and phrases that may be inappropriate.  If there are any questions or concerns please feel free to contact the dictating provider for clarification.  Mal Amabile, MD  12/23/16 0542       Devra Dopp, APRN - CNP  12/25/16 [phone]

## 2016-12-23 NOTE — ED Notes (Signed)
Pt states is a hard IV stick and only has IV placed in shoulder or an IJ.  Dr Jomarie LongsBelen notified and ok with paramedic to place in shoulder.      Cleotis Lemarystal Ashley, RN  12/23/16 [phone]

## 2016-12-23 NOTE — ED Provider Notes (Signed)
Emergency Department Encounter  [facility] 2E TELEMETRY    Patient: David [name]  MRN: [id number]  DOB: 1970-09-05  Date of Evaluation: 12/23/2016  ED APP Provider: Durel Salts, APRN - CNP    ED care was supervised by Dr. Laurene Footman  who independently examined and evaluated the patient. Please see their attestation note for further details.    Chief Complaint       Chief Complaint   Patient presents with   . Chest Pain     Left sided CP radiates down left arm and to left jaw, onset 2000 today.   . Diarrhea     Started Monday.      HOPI     David [name] is a 46 y.o. male who presents to the emergency department Patient presents to the emergency department with multiple complaints. First being that he is having left-sided chest pain that radiates down his LEFT arm and into his LEFT jaw that started at 10 PM tonight. Patient also states that he has had 8-7 bouts of diarrhea today and feels that his C. difficile may be coming back. Patient also states that he missed dialysis yesterday she was due for. His last dialysis day was Monday.    ROS:     Review of Systems   Constitutional: Positive for fatigue. Negative for chills and fever.   HENT: Negative.  Negative for congestion, ear pain and sore throat.    Eyes: Negative.  Negative for visual disturbance.   Respiratory: Negative for cough, shortness of breath and wheezing.    Cardiovascular: Positive for chest pain and leg swelling.   Gastrointestinal: Positive for diarrhea. Negative for abdominal distention, abdominal pain and nausea.   Endocrine: Negative.    Genitourinary: Negative.  Negative for dysuria, flank pain and frequency.   Musculoskeletal: Negative.    Skin: Negative.  Negative for color change and rash.   Allergic/Immunologic: Negative.    Neurological: Negative.  Negative for dizziness, weakness, numbness and headaches.   Psychiatric/Behavioral: Negative.      At least 10  systems reviewed and otherwise acutely negative except as in the [place].    Past History     Past  Medical History:   Diagnosis Date   . Asthma    . Dialysis patient ([place])    . Hypertension      History reviewed. No pertinent surgical history.  Social History     Social History   . Marital status: Single     Spouse name: N/A   . Number of children: N/A   . Years of education: N/A     Social History Main Topics   . Smoking status: Light Tobacco Smoker     Types: Cigarettes   . Smokeless tobacco: Never Used   . Alcohol use No   . Drug use: No   . Sexual activity: Not Asked     Other Topics Concern   . None     Social History Narrative   . None       Medications/Allergies     Discharge Medication List as of 12/23/2016 11:03 AM      CONTINUE these medications which have NOT CHANGED    Details   hydrALAZINE (APRESOLINE) 100 MG tablet Take 100 mg by mouth DailyHistorical Med      sevelamer (RENVELA) 800 MG tablet Take 1 tablet by mouth 3 times dailyHistorical Med      albuterol sulfate HFA 108 (90 Base) MCG/ACT inhaler Inhale 2 puffs into the  lungs every 6 hours as needed for WheezingHistorical Med      furosemide (LASIX) 20 MG tablet Take 80 mg by mouth daily Historical Med      amLODIPine (NORVASC) 10 MG tablet Take 10 mg by mouth dailyHistorical Med           Allergies   Allergen Reactions   . Lisinopril    . Morphine    . Toradol [Ketorolac Tromethamine]         Physical Exam       ED Triage Vitals [12/23/16 0109]   BP Temp Temp Source Pulse Resp SpO2 Height Weight   (!) 153/96 97 F (36.1 C) Temporal 93 20 97 % -- --     Physical Exam   Constitutional: He is oriented to person, place, and time. He appears well-developed and well-nourished.   HENT:   Head: Normocephalic and atraumatic.   Eyes: Pupils are equal, round, and reactive to light. Conjunctivae are normal.   Neck: Normal range of motion. Neck supple.   Cardiovascular: Normal rate, regular rhythm, S1 normal, S2 normal and normal heart sounds.  Exam reveals no gallop.    No murmur heard.  Pulmonary/Chest: Effort normal. He has decreased breath sounds in the  right lower field and the left lower field.       Abdominal: Soft. Bowel sounds are normal. There is no tenderness.   Musculoskeletal: Normal range of motion.   Neurological: He is alert and oriented to person, place, and time.   Skin: Skin is warm and dry.   Psychiatric: He has a normal mood and affect.   Nursing note and vitals reviewed.        Diagnostics   Labs:  Results for orders placed or performed during the hospital encounter of 21/30/86   Basic Metabolic Panel   Result Value Ref Range    Sodium 134 (L) 137 - 145 mmol/L    Potassium 4.8 3.5 - 5.1 mmol/L    Chloride 101 98 - 107 mmol/L    CO2 24 22 - 30 mmol/L    Anion Gap 9 NA    Glucose 82 70 - 100 mg/dL    BUN 50 (H) 7 - 20 mg/dL    CREATININE 9.58 (H) 0.52 - 1.25 mg/dL    eGFR African American 7.2 >60 mL/min    EGFR IF NonAfrican American 5.9 >60 mL/min    Calcium 6.6 (L) 8.4 - 10.4 mg/dL   Hemogram  (CBC) w/Auto Diff   Result Value Ref Range    WBC 4.8 3.6 - 10.7 10*3/uL    RBC 2.30 (L) 4.40 - 5.90 10*6/uL    Hemoglobin 7.0 (L) 13.0 - 18.0 g/dL    Hematocrit 21.5 (L) 40.0 - 52.0 %    MCV 93.5 80.0 - 98.0 fL    MCH 30.5 26.0 - 34.0 pg    MCHC 32.6 32.0 - 36.0 %    RDW 16.3 (H) 11.5 - 14.5 %    Platelets 139 (L) 140 - 440 10*3/uL    MPV 8.2 7.4 - 10.4 fL    Granulocytes % 71.5 40.0 - 80.0 %    Lymphocyte % 6.6 (L) 20.0 - 40.0 %    Monocytes 8.2 2.0 - 10.0 %    Eosinophils 12.6 (H) 1.0 - 6.0 %    Basophils 1.1 0.0 - 2.0 %    Absolute Neut # 3.4 1.8 - 7.0 10*3/uL    Absolute Lymph # 0.3 (L) 1.0 - 4.3 10*3/uL  Absolute Mono # 0.4 0.0 - 0.8 10*3/uL    Absolute Eos # 0.6 (H) 0.0 - 0.5 10*3/uL    Absolute Baso # 0.1 0.0 - 0.2 10*3/uL   Hepatic Function Panel   Result Value Ref Range    Albumin,Serum 3.7 3.5 - 5.0 g/dL    Total Protein 6.1 (L) 6.3 - 8.2 g/dL    Total Bilirubin 0.5 0.2 - 1.3 mg/dL    Bilirubin, Direct 0.0 0.0 - 0.3 mg/dL    Alkaline Phosphatase 229 (H) 38 - 126 U/L    ALT 27 13 - 69 U/L    AST 27 15 - 46 U/L   Lactic Acid, Plasma   Result Value  Ref Range    Lactic Acid <0.5 (L) 0.7 - 2.0 mmol/L   Lipase   Result Value Ref Range    Lipase 119 23 - 300 U/L   Troponin x1   Result Value Ref Range    Troponin I 0.063 (H) 0.000 - 0.034 ng/mL   Brain Natriuretic Peptide   Result Value Ref Range    NT Pro-BNP 103,554 (H) 0 - 125 pg/mL   Blood Occult Stool Screen #1   Result Value Ref Range    OCCULT BLOOD FECAL Negative Negative NA   Troponin   Result Value Ref Range    Troponin I 0.073 (H) 0.000 - 0.034 ng/mL   Vitamin B12   Result Value Ref Range    Vitamin B-12 310 239 - 931 pg/mL   Iron and TIBC   Result Value Ref Range    Iron 105 49 - 181 ug/dL    TIBC 252 (L) 261 - 497 ug/dL    Sat 42 15 - 50 %   Ferritin   Result Value Ref Range    Ferritin 1,120 (H) 18 - 464 ng/mL   Transferrin   Result Value Ref Range    Transferrin 192 (L) 206 - 381 mg/dL   Folate   Result Value Ref Range    Folate 8.7 2.8 - 20.0 ng/mL   TYPE AND SCREEN   Result Value Ref Range    ABO Grouping O NA    Rh Type POS NA    Antibody Screen NEG NA   PREPARE RBC (CROSSMATCH) Number of Units: 1, 1 Units   Result Value Ref Range    Unit Number [id number] NA    Dispense Status Blood Bank transfused NA    Product Code Blood Bank E0336V00 NA    Blood Type 5100 NA    Expiration Date [id number] NA   PREPARE RBC (CROSSMATCH) Number of Units: 1, 2 Units   Result Value Ref Range    Unit Number [id number] NA    Dispense Status Blood Bank selected NA    Product Code Blood Bank E0382V00 NA    Blood Type 5100 NA    Expiration Date [id number] NA     Radiographs:  No results found.    Procedures:       EKG: All EKG's are interpreted by the Emergency Department Physician in the absence of a cardiologist. Please see their note for interpretation of EKG.    ED Course and MDM    Glasgow Coma Scale  Eye Opening: Spontaneous  Best Verbal Response: Oriented  Best Motor Response: Obeys commands  Glasgow Coma Scale Score: 15 Heart Score for chest pain patients  History: Moderately Suspicious  ECG:  Non-Specifc repolarization disturbance/LBTB/PM  Patient Age: > 45 and < 65 years  *Risk  factors for Atherosclerotic disease: Cigarette smoking, Hypertension, Coronary Artery Disease, Obesity  Risk Factors: > 3 Risk factors or history of atherosclerotic disease*  Troponin: < 1X normal limit  Heart Score Total: 5    In brief, David [name] is a 46 y.o. male who presented to the emergency department patient presents to the emergency department with multiple complaints of first being that he is having chest pain midsternal and radiates the LEFT side of his chest. He states he is having pain that radiates down his LEFT arm and to his LEFT jaw. He states that that happened at 10 PM this evening. Patient states he also feels like he may be having the start up of his C. difficile again he started with diarrhea on Monday. He states he has had 7 bouts of diarrhea today. Patient states last month he was treated for pneumonia and just got off vancomycin orally and Flagyl. Patient states that he last had his renal dialysis on Monday. And he missed yesterday's dialysis. Patient states that he had a permanent LEFT upper chest Vas-Cath that was has been in place for 1 year. Patient states that he is from [place] recently relocated to the [place] area. He states he has had an MRI in 2010 with 2 coronary artery stent placements. He is a smoker he smokes 10 cigarettes a day. He denies any illicit street drugs or daily EtOH usage. Patient states he wears O2 at home 3-4 L as needed. Patient states that he is having some increased shortness of breath. His vital signs are within normal limits. He denies any fevers or chills. He is afebrile here in the emergency department I took his temperature orally was 98.3. Patient is not tachycardic as heart rate is 93. EKG was completed and interpreted by Dr. Lorane Gell please see his note. Patient will have a CBC, BMP, hepatic panel, lipase portable chest x-ray upright 2 view. Troponin level.     Nitro was ordered by Dr Wayne Sever and pt was off to radiology for imaging. Labs are pending  ED Medication Orders     Start Ordered     Status Ordering Provider    12/23/16 [phone] 12/23/16 0706  HYDROmorphone (DILAUDID) injection 1 mg  ONCE      Last MAR action:  Given - by Caryl Pina, CRYSTAL on 12/23/16 at [place] [facility]    12/23/16 0450 12/23/16 0449  aspirin chewable tablet 324 mg  ONCE      Last MAR action:  Given - by Caryl Pina, CRYSTAL on 12/23/16 at 0520 Orbie Pyo [facility]    12/23/16 0449 12/23/16 0448  HYDROmorphone (DILAUDID) injection 1 mg  ONCE      Last MAR action:  Given - by ASHLEY, CRYSTAL on 12/23/16 at 0521 Orbie Pyo [facility]    12/23/16 0205 12/23/16 0204  ondansetron (ZOFRAN) injection 4 mg  ONCE      Last MAR action:  Given - by Caryl Pina, CRYSTAL on 12/23/16 at 0259 Orbie Pyo [facility]    12/23/16 0140 12/23/16 0139  albuterol (PROVENTIL) nebulizer solution 2.5 mg  ONCE      Last MAR action:  Given - by Mathis Bud on 12/23/16 at [place], Tahjir Silveria A    12/23/16 0140 12/23/16 0139  ipratropium (ATROVENT) 0.02 % nebulizer solution 0.5 mg  ONCE      Last MAR action:  Given - by Mathis Bud on 12/23/16 at 0232 Jonah Blue A          Final  Impression      1. Chest pain, unspecified type    2. Diarrhea, unspecified type    3. Acute on chronic congestive heart failure, unspecified heart failure type ([place])    4. Anemia, unspecified type        DISPOSITION       (Please note that portions of this note may have been completed with a voice recognition program. Efforts were made to edit the dictations but occasionally words are mis-transcribed.)    Durel Salts, APRN - CNP  [facility]     Sharay Bellissimo A [place], APRN - CNP  12/25/16 2210

## 2016-12-23 NOTE — Consults (Addendum)
[facility]     Nephrology Consultation Note    Reason for consultation:  ESRD, need for dialysis    Chief Complaint: Diarrhea    History of Presenting Illness      Patient is a 46y/o M with h/o ESRD HD MWF at [facility] in [place] who presented with diarrhea, stomach pain, chest pain. Has been on dialysis x 7 years now. Has LIJ tunneled HD catheter  For access, says he has exhausted all other peripheral accesses for HD.   Last HD was on Monday, has missed HD yesterday.   He is visiting this area to see his friend who lives here.   Did not set up outpatient HD in the area prior to visiting. Says he usually calls the ''navigator'' and they are able to find him a chair in a nearby town whenever he is travelling.   Hb found to be low on admission, says ''for some reason I need a blood transfusion on a monthly basis''.   Asking for narcotics, threatens to leave AMA otherwise.         Past Medical/Surgical History      Past Medical History:   Diagnosis Date   . Asthma    . Dialysis patient ([facility])    . Hypertension      History reviewed. No pertinent surgical history.      Review of Systems     All 12 systems reviewed and are negative except for what is mentioned in the HPI.     Medications      Current Facility-Administered Medications   Medication Dose Route Frequency Provider Last Rate Last Dose   . hydrALAZINE (APRESOLINE) tablet 100 mg  100 mg Oral 3 times per day Shreebatsa Dhital, MD       . sevelamer (RENVELA) tablet 800 mg  800 mg Oral TID WC Shreebatsa Dhital, MD       . sodium chloride flush 0.9 % injection 10 mL  10 mL Intravenous 2 times per day Shreebatsa Dhital, MD       . sodium chloride flush 0.9 % injection 10 mL  10 mL Intravenous PRN Shreebatsa Dhital, MD       . acetaminophen (TYLENOL) tablet 650 mg  650 mg Oral Q4H PRN Shreebatsa Dhital, MD       . magnesium hydroxide (MILK OF MAGNESIA) 400 MG/5ML suspension 30 mL  30 mL Oral Daily PRN Shreebatsa Dhital, MD       . ondansetron  (ZOFRAN) injection 4 mg  4 mg Intravenous Q6H PRN Shreebatsa Dhital, MD   4 mg at 12/23/16 0824   . [START ON 12/24/2016] aspirin chewable tablet 81 mg  81 mg Oral Daily Shreebatsa Dhital, MD       . nitroGLYCERIN (NITROSTAT) SL tablet 0.4 mg  0.4 mg Sublingual Q5 Min PRN Shreebatsa Dhital, MD       . ipratropium-albuterol (DUONEB) nebulizer solution 1 ampule  1 ampule Inhalation Q4H PRN Shreebatsa Dhital, MD       . 0.9 % sodium chloride bolus  250 mL Intravenous Once Vilinda Flake, DO       . perflutren lipid microspheres (DEFINITY) injection 1.65 mg  1.5 mL Intravenous ONCE PRN Vilinda Flake, DO       . sodium chloride flush 0.9 % injection 10 mL  10 mL Intravenous PRN Vilinda Flake, DO       . oxyCODONE-acetaminophen (PERCOCET) 5-325 MG per tablet 1 tablet  1 tablet Oral Q4H PRN Greggory Stallion  [place], DO           Allergies     Lisinopril; Morphine; and Toradol [ketorolac tromethamine]      Family History     History reviewed. No pertinent family history.    Social History      Social History     Social History   . Marital status: Single     Spouse name: N/A   . Number of children: N/A   . Years of education: N/A     Social History Main Topics   . Smoking status: Light Tobacco Smoker     Types: Cigarettes   . Smokeless tobacco: None   . Alcohol use No   . Drug use: No   . Sexual activity: Not Asked     Other Topics Concern   . None     Social History Narrative   . None       Physical Exam     Blood pressure (!) 140/99, pulse 85, temperature 98.3 F (36.8 C), temperature source Temporal, resp. rate 19, SpO2 99 %.  24 HR INTAKE/OUTPUT:   Intake/Output Summary (Last 24 hours) at 12/23/16 1030  Last data filed at 12/23/16 0650   Gross per 24 hour   Intake              300 ml   Output                0 ml   Net              300 ml       General:  NAD, Comfortable appearing, cooperative to history and physical exam.   Head: AT NC  Neck:  Supple, no jvd  Chest:  B/L clear, no crackles, no accessory muscles usage.   CV:  RRR, no  murmurs or rubs  Abdomen:  NT, ND, soft  Extremities:  No peripheral edema, no tremor    Data     Recent Labs      12/23/16   0235   WBC  4.8   HGB  7.0*   HCT  21.5*   MCV  93.5   PLT  139*     Recent Labs      12/23/16   0235   NA  134*   K  4.8   CL  101   CO2  24   GLUCOSE  82   BUN  50*   CREATININE  9.58*     Albumin:   Lab Results   Component Value Date    LABALBU 3.7 12/23/2016      Calcium:   Lab Results   Component Value Date    CALCIUM 6.6 12/23/2016     ABGs: (PHART,PO2ART,PCO2ART,HCO3ART,BEART,O2SATART)    Imaging: Reviewed.     Assessment:  1. ESRD  2. Volume overload.   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 Renal Osteodystrophy  5. Non compliance?    Plan:   Needs HD today, d/w him.    However, at this point is deliberating to leave AMA if he doesn't get narcotics.    I explained to him that HD is needed today, but won't be possible if he is wanting to leave.    He reports that he will just drive back to [place] to his hometown and get dialysis there.    C/w home meds including renvela.    Agree with prbc x 1 transfusion.    If decides to stay inpatient, will  order HD x 1 with blood transfusion during the HD session.     Thank you for asking us to participate in the management of your patient, please do not hesitate to contact me for any concerns regarding my recommendations as outlined above.      Quintessa Simmerman Thedore MinsSingh, M.D.   [facility]

## 2016-12-23 NOTE — ED Provider Notes (Signed)
Department of Emergency Medicine   ED  Provider Note  Admit Date/RoomTime: 12/23/2016 12:36 PM  ED Room: 03/03    HPI:   David [name] is a 46 y.o. male presenting to the ED for chest pain, beginning this AM. The complaint has been constant, moderate in severity, and worsened by moderate exertion. Pain is focal to the pt's mid-sternal region. He describes it as a pressure. In addition to the chest pain he has accompanying SOB along with fever, abd pain, nausea, emesis, and diarrhea. He has had C. diff last month and states his GI Sx feel similar. He is visiting from [place], since 3 days ago. He gets dialysis MWF, missed his dialysis yesterday. His GI Sx have been going on since he got to [place]. 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 He denies HA, palpitations, diaphoresis, leg swelling or pain, focal paresthesias, lightheadedness, chills, hematochezia, and melena.     ROS:   Pertinent positives and negatives are stated within HPI, all other systems reviewed and are negative.    --------------------------------------------- PAST HISTORY ---------------------------------------------  Past Medical History:  has a past medical history of Asthma; Dialysis patient (HCC); and Hypertension.    Past Surgical History:  has no past surgical history on file.    Social History:  reports that he has been smoking Cigarettes.  He has never used smokeless tobacco. He reports that he does not drink alcohol or use drugs.    Family History: family history is not on file.     The patient's home medications have been reviewed.    Allergies: Lisinopril; Morphine; and Toradol [ketorolac tromethamine]    -------------------------------------------------- RESULTS -------------------------------------------------  All laboratory and radiology results have been personally reviewed by myself   LABS:  Results for orders placed or performed during the hospital encounter of 12/23/16   CBC Auto Differential   Result Value Ref Range    WBC 5.7  4.5 - 11.5 E9/L    RBC 2.55 (L) 3.80 - 5.80 E12/L    Hemoglobin 7.9 (L) 12.5 - 16.5 g/dL    Hematocrit 16.1 (L) 37.0 - 54.0 %    MCV 99.2 80.0 - 99.9 fL    MCH 31.0 26.0 - 35.0 pg    MCHC 31.2 (L) 32.0 - 34.5 %    RDW 16.8 (H) 11.5 - 15.0 fL    Platelets 129 (L) 130 - 450 E9/L    MPV 10.2 7.0 - 12.0 fL    Neutrophils % 69.0 43.0 - 80.0 %    Lymphocytes % 6.0 (L) 20.0 - 42.0 %    Monocytes % 3.0 2.0 - 12.0 %    Eosinophils % 21.0 (H) 0.0 - 6.0 %    Basophils % 1.0 0.0 - 2.0 %    Neutrophils # 3.93 1.80 - 7.30 E9/L    Lymphocytes # 0.34 (L) 1.50 - 4.00 E9/L    Monocytes # 0.17 0.10 - 0.95 E9/L    Eosinophils # 1.20 (H) 0.05 - 0.50 E9/L    Basophils # 0.06 0.00 - 0.20 E9/L    nRBC 1.0 /100 WBC    Anisocytosis 1+    Comprehensive Metabolic Panel   Result Value Ref Range    Sodium 141 132 - 146 mmol/L    Potassium 5.1 (H) 3.5 - 5.0 mmol/L    Chloride 100 98 - 107 mmol/L    CO2 22 22 - 29 mmol/L    Anion Gap 19 (H) 7 - 16 mmol/L  Glucose 96 74 - 109 mg/dL    BUN 52 (H) 6 - 20 mg/dL    CREATININE 45.4 (HH) 0.7 - 1.2 mg/dL    GFR Non-African American 7 >=60 mL/min/1.73    GFR African American 7     Calcium 6.5 (L) 8.6 - 10.2 mg/dL    Total Protein 6.7 6.4 - 8.3 g/dL    Alb 3.8 3.5 - 5.2 g/dL    Total Bilirubin 0.5 0.0 - 1.2 mg/dL    Alkaline Phosphatase 279 (H) 40 - 129 U/L    ALT 10 0 - 40 U/L    AST 17 0 - 39 U/L   Protime-INR   Result Value Ref Range    Protime 12.4 9.3 - 12.4 sec    INR 1.1    APTT   Result Value Ref Range    aPTT 33.4 24.5 - 35.1 sec   Lipase   Result Value Ref Range    Lipase 46 13 - 60 U/L   Magnesium   Result Value Ref Range    Magnesium 1.9 1.6 - 2.6 mg/dL   Phosphorus   Result Value Ref Range    Phosphorus 5.6 (H) 2.5 - 4.5 mg/dL   Lactic Acid, Plasma   Result Value Ref Range    Lactic Acid 1.3 0.5 - 2.2 mmol/L   Troponin   Result Value Ref Range    Troponin 0.95 (H) 0.00 - 0.03 ng/mL   EKG 12 Lead   Result Value Ref Range    Ventricular Rate 87 BPM    Atrial Rate 87 BPM    P-R Interval 192 ms     QRS Duration 86 ms    Q-T Interval 406 ms    QTc Calculation (Bazett) 488 ms    P Axis 59 degrees    R Axis -19 degrees    T Axis 99 degrees       RADIOLOGY:  Interpreted by Radiologist.  XR CHEST PORTABLE   Final Result   No evidence of acute pulmonary disease.                EKG:  This EKG is signed and interpreted by me.    Rate: 87  Rhythm: Sinus with 1st degree AV block  Interpretation: No acute abnormalities  Comparison: no previous EKG    ------------------------- NURSING NOTES AND VITALS REVIEWED ---------------------------   The nursing notes within the ED encounter and vital signs as below have been reviewed.   BP (!) 150/87   Pulse 88   Temp 98 F (36.7 C) (Oral)   Resp 14   Ht  (1.854 m)   Wt 233 lb (105.7 kg)   SpO2 95%   BMI 30.74 kg/m   Oxygen Saturation Interpretation: Normal    ---------------------------------------------------PHYSICAL EXAM--------------------------------------    Constitutional/General: Alert and oriented x3, well appearing, non toxic in NAD. Afebrile.   Head: NC/AT  Eyes: PERRL, EOMI  HENT: Oropharynx clear. Handling secretions.   Neck: Supple, full ROM  Pulmonary: Lungs clear to auscultation bilaterally, no wheezes, rales, or rhonchi. Not in respiratory distress.  Cardiovascular: Regular rate. Regular rhythm. No murmurs. No gallops, or rubs. 2+ distal pulses.  Abdomen: Soft, non tender, non distended. No guarding, rebound, or rigidity.   Extremities: Moves all extremities x 4. Warm and well perfused. No edema.   Skin: Warm and dry without rash.  Neurologic: GCS 15, no focal deficits, systemic strength 5/5 to all extremities symmetrically, CN II-XII grossly intact.  Psych: Speech and behavior appropriate.       ------------------------------ ED COURSE/MEDICAL DECISION MAKING----------------------  Medications   ondansetron (ZOFRAN) injection 4 mg (4 mg Intravenous Given 12/23/16 1303)   fentaNYL (SUBLIMAZE) injection 50 mcg (50 mcg Intravenous Given 12/23/16 1303)    hydrALAZINE (APRESOLINE) injection 20 mg (20 mg Intravenous Given 12/23/16 1342)            Medical Decision Making:    Pt is a 46 year old male presenting to the ED for chest pain w/SOB and abd pain w/nausea, emesis, and diarrhea. Recent C. diff as of 1 month ago. He appears in NAD, his BP is elevated, his HR is normal. He will be given the above medications. Labs and imaging ordered.     Re-evaluations:  1443  Pt is requesting to sign out AMA. Pt understands the risks of leaving for the completion of treatment.  A chest stated that he is driving to [place] to see his regular doctor and receive his dialysis at this facility in [place]    Counseling:   The emergency provider has spoken with the patient and discussed today's results, in addition to providing specific details for the plan of care and counseling regarding the diagnosis and prognosis. Questions are answered at this time and they are agreeable with the plan.      --------------------------------- IMPRESSION AND DISPOSITION ---------------------------------    IMPRESSION  1. C. difficile colitis        DISPOSITION  Disposition: Other Disposition: Left AMA  Patient condition is stable    12/23/16, 12:55 PM.    This note is prepared by Marcella DubsJoseph Petrolla, acting as Scribe for Dayle PointsAlbert S Zymiere Trostle, MD.    Dayle PointsAlbert S Mell Guia, MD:  The scribe's documentation has been prepared under my direction and personally reviewed by me in its entirety. I confirm that the note above accurately reflects all work, treatment, procedures, and medical decision making performed by me.             Dayle PointsAlbert S Earon Rivest, MD  12/23/16 [phone]

## 2016-12-23 NOTE — ED Triage Notes (Addendum)
Dialysis patient, port noted to left upper chest, states last dialysis was Monday, missed his appointment yesterday due to diarrhea. Noted abdominal distention.

## 2016-12-23 NOTE — H&P (Addendum)
Attending History and Physical    Admit Date: 12/23/2016  PCP: No primary care provider on file.    CHIEF COMPLAINT:  Chest pain, diarrhea, sob    Reason for Admission:  AKI, ESRD on HD, volume overload, diarrhea, chest pain    History Obtained From:  patient, electronic medical record    HISTORY OF PRESENT ILLNESS:    David [name] is a 46 y.o. male with past medical history below who presents with chest pain and diarrhea. Recent hospital admission, treated for pneumonia. Also treated for c.diff within the past month. Completed vancomycin dose and diarrhea ceased. The patient had return of diarrhea Monday. Yesterday the patient developed chest pain. Describes it as a sensation like something is sitting on his chest. Located on the LEFT heart of his chest and radiating to his LEFT arm and jaw. Chest pain is associated with difficulty breathing. He is a dialysis patient (MWF)  But missed his Weds session. Patient is moving here from out of town and does not have any doctors or established dialysis centers. Denies abdominal pain, vomiting, constipation, fevers, or chills. We will admit for further evaluation.    Past Medical History:        Diagnosis Date   ??? Asthma    ??? Dialysis patient [facility](HCC)    ??? Hypertension      Past Surgical History:    History reviewed. No pertinent surgical history.  Social History:   Social History     Social History   ??? Marital status: Single     Spouse name: N/A   ??? Number of children: N/A   ??? Years of education: N/A     Occupational History   ??? Not on file.     Social History Main Topics   ??? Smoking status: Light Tobacco Smoker     Types: Cigarettes   ??? Smokeless tobacco: Not on file   ??? Alcohol use No   ??? Drug use: No   ??? Sexual activity: Not on file     Other Topics Concern   ??? Not on file     Social History Narrative   ??? No narrative on file     Family History:   History reviewed. No pertinent family history.  Medications Prior to Admission:    No prescriptions prior to admission.    Allergies:   Lisinopril; Morphine; and Toradol [ketorolac tromethamine]    REVIEW OF SYSTEMS:  Constitutional: Negative for fever, chills, activity change and unexpected weight change.   HEENT: Negative for congestion, postnasal drip and sneezing.   Eyes: Negative for itching and visual disturbance.   Respiratory: Negative for apnea, cough, choking, chest tightness, shortness of breath, wheezing and stridor.   Cardiovascular: Negative for chest pain.   Gastrointestinal: Positive for diarrhea   Genitourinary: Negative for dysuria, frequency and flank pain.   Musculoskeletal: Positive for leg edema   Skin: Negative for rash.   Neurological: Negative for dizziness, tremors, seizures, syncope, facial asymmetry, speech difficulty, weakness, numbness and headaches.   Hematological: Negative for adenopathy.   Psychiatric/Behavioral: Negative for suicidal ideas, behavioral problems, self-injury and dysphoric mood.     Vitals:  BP (!) 140/99    Pulse 85    Temp 98.3 ??F (36.8 ??C) (Temporal)    Resp 19    SpO2 99%   BMI Classification: Obese (BMI 30.0-39.9)  Pulse Ox: SpO2  Avg: 98.3 %  Min: 97 %  Max: 100 %  Supplemental O2: O2 Flow Rate (L/min): 4  L/min    PHYSICAL EXAM:  General appearance:  No apparent distress, appears stated age and cooperative with exam.  HEENT:  Normal cephalic, atraumatic without obvious deformity. Pupils equal, round, and reactive to light.  Extra ocular muscles intact. Conjunctivae/corneas clear.  Neck: Supple, with full range of motion. No jugular venous distention. Trachea midline. No lymphadenopathy.  Respiratory: diminished at bases bilaterally  Cardiovascular:  Regular rate and rhythm with normal S1/S2 without murmurs, rubs or gallops.  Abdomen: Soft, non-tender, non-distended with normal bowel sounds. No rebound or guarding.  Musculoskeletal: bilateral LE +1 pitting edema  Skin: Skin color, texture, turgor normal.  No rashes or lesions.  Neurologic:  Neurovascularly intact without any focal sensory/motor  deficits. Cranial nerves: II-XII intact, grossly non-focal.  Psychiatric:  Alert and oriented, thought content appropriate, normal insight.    DATA:   CBC:   Recent Labs      12/23/16   0235   WBC  4.8   RBC  2.30*   HGB  7.0*   HCT  21.5*   MCV  93.5   RDW  16.3*   PLT  139*     BMP:  Recent Labs      12/23/16   0235   NA  134*   K  4.8   CL  101   CO2  24   BUN  50*   CREATININE  9.58*   GLUCOSE  82   CALCIUM  6.6*   ANIONGAP  9     LIVER PROFILE:  Recent Labs      12/23/16   0235   AST  27   ALT  27   BILITOT  0.5   ALKPHOS  229*   LABALBU  3.7   PROT  6.1*      PT/INR: No results for input(s): PROTIME, INR in the last 72 hours.  CARDIAC ENZYMES:   Recent Labs      12/23/16   0235   TROPONINI  0.063*     Procalcitonin: No results found for: PROCAL  Urine Culture:  No results found for this or any previous visit.  I reviewed:    laboratory results    radiographic results  At the time of today's encounter.   Pt was advised of the results.     IMPRESSION:  1. 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 Volume overload  3. Chest pain  4. Indeterminate troponin in setting of ESRD  5. Diarrhea, recent c.diff infection  6. Acute/chronic anemia  7. Obesity  8. B/L LE edema  PLAN:    -telemetry monitoring, daily weights, I&Os, renal US, nephrology consult for dialysis  -transfuse 2 units PRBCs  -check 2D echo, cycle cardiac enzymes  -check c.diff, stool studies  -check LE [place]   -increase activity  -am labs, replace lytes prn  -vitals per routine  -home meds as ordered  -DVT prophylaxis:   Lovenox                                  Heparin                                   SCDs                                   [  x] Encourage ambulation              Already on Anticoagulation  -see below for additional orders, further recommendations to follow  Orders Placed This Encounter   Procedures   ??? C Diff Toxin by RT PCR   ??? CULTURE STOOL   ??? Fecal Leukocytes   ??? XR CHEST STANDARD (2 VW)   ??? [place] RETROPERITONEAL LIMITED   ??? Basic Metabolic Panel    ??? Hemogram  (CBC) w/Auto Diff   ??? Hepatic Function Panel   ??? Lactic Acid, Plasma   ??? Lipase   ??? Troponin x1   ??? Brain Natriuretic Peptide   ??? Blood Occult Stool Screen #1   ??? Vitamin B12   ??? Transferrin   ??? Iron and TIBC   ??? FOLATE   ??? Ferritin   ??? Vitamin B12   ??? Iron and TIBC   ??? Ferritin   ??? Transferrin   ??? Folate   ??? Troponin   ??? Vital signs per Policy   ??? Cardiac telemetry monitoring   ??? Tobacco cessation education   ??? Inpatient consult to Nephrology   ??? TYPE AND SCREEN   ??? PREPARE RBC (CROSSMATCH) Number of Units: 1, 1 Units   ??? PREPARE RBC (CROSSMATCH) Number of Units: 1, 2 Units   ??? Saline lock IV   ??? PATIENT STATUS (FROM ED OR OR/PROCEDURAL) Inpatient       Code status: Full code    Please forward a copy of this H&P to the patient's PCP. Thank you.  Electronically signed by Vilinda Flake, DO on 12/23/16 at 7:04 AM

## 2016-12-23 NOTE — Telephone Encounter (Signed)
Writer contacted Dr. Durwin RegesAiadtoss, ED provider to inform of 30 day readmission risk.   Most likely will be admitted.

## 2016-12-24 LAB — BPAM PACKED CELL ORDER
UNIT DIVISION: 0
UNIT DIVISION: 0

## 2016-12-24 LAB — TYPE AND CROSS RED CELLS - UNITS
ABO/RH(D): O POS
ANTIBODY SCREEN: NEGATIVE
UNITS ORDERED: 2

## 2016-12-24 LAB — CLOSTRIDIUM DIFFICILE EIA: C difficile Toxin, EIA: NOT DETECTED

## 2016-12-26 LAB — PREPARE RBC (CROSSMATCH)
Blood Type: 5100 NA
Expiration Date: 201809132359 NA

## 2016-12-28 LAB — CULTURE BLOOD #1: Blood Culture, Routine: 5

## 2016-12-28 LAB — CULTURE, BLOOD 2: Culture, Blood 2: 5

## 2016-12-29 ENCOUNTER — Inpatient Hospital Stay
Admission: EM | Admit: 2016-12-29 | Discharge: 2016-12-29 | DRG: 313 | Discharge status: Against Medical Advice | Payer: Medicare Other | Attending: FAMILY PRACTICE | Admitting: FAMILY PRACTICE

## 2016-12-29 ENCOUNTER — Emergency Department (HOSPITAL_COMMUNITY): Payer: Self-pay | Admitting: Emergency Medicine

## 2016-12-29 ENCOUNTER — Encounter (HOSPITAL_COMMUNITY): Payer: Self-pay

## 2016-12-29 ENCOUNTER — Observation Stay (HOSPITAL_COMMUNITY): Payer: Medicare Other | Admitting: FAMILY PRACTICE

## 2016-12-29 ENCOUNTER — Emergency Department (HOSPITAL_COMMUNITY): Payer: Medicare Other

## 2016-12-29 DIAGNOSIS — R079 Chest pain, unspecified: Secondary | ICD-10-CM

## 2016-12-29 DIAGNOSIS — I252 Old myocardial infarction: Secondary | ICD-10-CM

## 2016-12-29 DIAGNOSIS — R000 Tachycardia, unspecified: Secondary | ICD-10-CM

## 2016-12-29 DIAGNOSIS — I120 Hypertensive chronic kidney disease with stage 5 chronic kidney disease or end stage renal disease: Secondary | ICD-10-CM | POA: Diagnosis present

## 2016-12-29 DIAGNOSIS — Z955 Presence of coronary angioplasty implant and graft: Secondary | ICD-10-CM

## 2016-12-29 DIAGNOSIS — Z765 Malingerer [conscious simulation]: Secondary | ICD-10-CM

## 2016-12-29 DIAGNOSIS — J45909 Unspecified asthma, uncomplicated: Secondary | ICD-10-CM | POA: Diagnosis present

## 2016-12-29 DIAGNOSIS — I214 Non-ST elevation (NSTEMI) myocardial infarction: Secondary | ICD-10-CM | POA: Diagnosis present

## 2016-12-29 DIAGNOSIS — R9431 Abnormal electrocardiogram [ECG] [EKG]: Secondary | ICD-10-CM

## 2016-12-29 DIAGNOSIS — I2510 Atherosclerotic heart disease of native coronary artery without angina pectoris: Secondary | ICD-10-CM | POA: Diagnosis present

## 2016-12-29 DIAGNOSIS — Z992 Dependence on renal dialysis: Secondary | ICD-10-CM

## 2016-12-29 DIAGNOSIS — N186 End stage renal disease: Secondary | ICD-10-CM | POA: Diagnosis present

## 2016-12-29 DIAGNOSIS — F17210 Nicotine dependence, cigarettes, uncomplicated: Secondary | ICD-10-CM | POA: Diagnosis present

## 2016-12-29 DIAGNOSIS — I517 Cardiomegaly: Secondary | ICD-10-CM

## 2016-12-29 DIAGNOSIS — I4510 Unspecified right bundle-branch block: Secondary | ICD-10-CM

## 2016-12-29 HISTORY — DX: Atherosclerotic heart disease of native coronary artery without angina pectoris: I25.10

## 2016-12-29 HISTORY — DX: Presence of dental prosthetic device (complete) (partial): Z97.2

## 2016-12-29 HISTORY — DX: Presence of spectacles and contact lenses: Z97.3

## 2016-12-29 LAB — BASIC METABOLIC PANEL
ANION GAP: 13 mmol/L
BUN/CREA RATIO: 5
BUN: 38 mg/dL — ABNORMAL HIGH (ref 10–25)
CALCIUM: 6.7 mg/dL — ABNORMAL LOW (ref 8.2–10.2)
CHLORIDE: 101 mmol/L (ref 98–111)
CO2 TOTAL: 25 mmol/L (ref 21–35)
CREATININE: 8.31 mg/dL — ABNORMAL HIGH (ref <=1.30)
ESTIMATED GFR: 8 mL/min/1.73m?2 — ABNORMAL LOW
ESTIMATED GFR: 8 mL/min/1.73mˆ2 — ABNORMAL LOW
GLUCOSE: 115 mg/dL — ABNORMAL HIGH (ref 70–110)
POTASSIUM: 4.4 mmol/L (ref 3.5–5.0)
SODIUM: 139 mmol/L (ref 135–145)

## 2016-12-29 LAB — CBC WITH DIFF
BASOPHIL #: 0.1 10*3/uL (ref 0.00–0.20)
BASOPHIL %: 1 %
BASOPHIL %: 1 %
EOSINOPHIL #: 0.4 10*3/uL (ref 0.00–0.50)
EOSINOPHIL %: 8 %
HCT: 26.6 % — ABNORMAL LOW (ref 38.9–50.5)
HGB: 9.1 g/dL — ABNORMAL LOW (ref 13.4–17.3)
LYMPHOCYTE #: 0.4 x10ˆ3/uL — ABNORMAL LOW (ref 0.80–3.20)
LYMPHOCYTE %: 7 %
MCH: 31.7 pg (ref 27.9–33.1)
MCH: 31.7 pg (ref 27.9–33.1)
MCHC: 34.2 g/dL (ref 32.8–36.0)
MCV: 92.7 fL (ref 82.4–95.0)
MCV: 92.7 fL (ref 82.4–95.0)
MONOCYTE #: 0.5 10*3/uL (ref 0.20–0.80)
MONOCYTE %: 9 %
MONOCYTE %: 9 %
MPV: 7.8 fL (ref 6.0–10.2)
NEUTROPHIL #: 4 10*3/uL (ref 1.60–5.50)
NEUTROPHIL %: 75 %
PLATELETS: 119 x10ˆ3/uL — ABNORMAL LOW (ref 140–440)
RBC: 2.87 x10ˆ6/uL — ABNORMAL LOW (ref 4.40–5.68)
RDW: 16 % — ABNORMAL HIGH (ref 10.9–15.1)
WBC: 5.3 x10ˆ3/uL (ref 3.3–9.3)

## 2016-12-29 LAB — ECG 12 LEAD - ADULT
Calculated P Axis: 83 deg
Calculated T Axis: 97 deg
EKG Severity: ABNORMAL
Heart Rate: 90 BPM
Heart Rate: 90 {beats}/min
I 40 Axis: 46 deg
PR Interval: 195 ms
QRS Axis: -4 deg
QRS Duration: 104 ms
QT Interval: 411 ms
QTC Calculation: 503 ms
ST Axis: 138 deg
T 40 Axis: -31 deg

## 2016-12-29 LAB — ECG 12 LEAD - ED USE
Calculated P Axis: 63 deg
Calculated T Axis: 91 deg
EKG Severity: ABNORMAL
Heart Rate: 104 {beats}/min
Heart Rate: 104 {beats}/min
I 40 Axis: 43 deg
PR Interval: 181 ms
QRS Axis: -1 deg
QRS Axis: -1 deg
QRS Duration: 109 ms
QT Interval: 366 ms
QTC Calculation: 482 ms
ST Axis: 113 deg
ST Axis: 113 deg
T 40 Axis: -30 deg

## 2016-12-29 LAB — PT/INR: INR: 1.13 — ABNORMAL HIGH (ref 0.90–1.10)

## 2016-12-29 LAB — TROPONIN-I: TROPONIN I: 0.07 ng/mL (ref <=0.04)

## 2016-12-29 MED ORDER — ACETAMINOPHEN 325 MG TABLET
650.0000 mg | ORAL_TABLET | Freq: Four times a day (QID) | ORAL | Status: DC | PRN
Start: 2016-12-29 — End: 2016-12-29

## 2016-12-29 MED ORDER — AMLODIPINE 10 MG TABLET
10.0000 mg | ORAL_TABLET | Freq: Every day | ORAL | Status: DC
Start: 2016-12-29 — End: 2016-12-29
  Filled 2016-12-29: qty 1

## 2016-12-29 MED ORDER — ONDANSETRON HCL (PF) 4 MG/2 ML INJECTION SOLUTION
4.0000 mg | INTRAMUSCULAR | Status: AC
Start: 2016-12-29 — End: 2016-12-29
  Administered 2016-12-29: 4 mg via INTRAVENOUS
  Filled 2016-12-29: qty 2

## 2016-12-29 MED ORDER — ALUMINUM-MAG HYDROXIDE-SIMETHICONE 200 MG-200 MG-20 MG/5 ML ORAL SUSP
20.0000 mL | ORAL | Status: DC | PRN
Start: 2016-12-29 — End: 2016-12-29

## 2016-12-29 MED ORDER — ALBUTEROL SULFATE 2.5 MG/3 ML (0.083 %) SOLUTION FOR NEBULIZATION
3.00 mL | INHALATION_SOLUTION | Freq: Four times a day (QID) | RESPIRATORY_TRACT | Status: DC | PRN
Start: 2016-12-29 — End: 2016-12-29

## 2016-12-29 MED ORDER — PANTOPRAZOLE 40 MG TABLET,DELAYED RELEASE
40.0000 mg | DELAYED_RELEASE_TABLET | Freq: Every morning | ORAL | Status: DC
Start: 2016-12-30 — End: 2016-12-29
  Filled 2016-12-29: qty 1

## 2016-12-29 MED ORDER — NITROGLYCERIN 0.4 MG SUBLINGUAL TABLET
0.4000 mg | SUBLINGUAL_TABLET | SUBLINGUAL | Status: AC
Start: 2016-12-29 — End: 2016-12-29
  Administered 2016-12-29: 0.4 mg via SUBLINGUAL
  Filled 2016-12-29: qty 1

## 2016-12-29 MED ORDER — FUROSEMIDE 80 MG TABLET
80.0000 mg | ORAL_TABLET | Freq: Every day | ORAL | Status: DC
Start: 2016-12-29 — End: 2016-12-29
  Filled 2016-12-29: qty 1

## 2016-12-29 MED ORDER — HEPARIN (PORCINE) IN DEXTROSE 5 % 25,000 UNIT/500 ML IV - ~~LOC~~
15.0000 [IU]/kg/h | INTRAVENOUS | Status: DC
Start: 2016-12-29 — End: 2016-12-29
  Filled 2016-12-29: qty 500

## 2016-12-29 MED ORDER — POLYETHYLENE GLYCOL 3350 17 GRAM ORAL POWDER PACKET
17.0000 g | Freq: Every day | ORAL | Status: DC | PRN
Start: 2016-12-29 — End: 2016-12-29
  Filled 2016-12-29: qty 1

## 2016-12-29 MED ORDER — SODIUM CHLORIDE 0.9 % (FLUSH) INJECTION SYRINGE
3.0000 mL | INJECTION | Freq: Three times a day (TID) | INTRAMUSCULAR | Status: DC
Start: 2016-12-29 — End: 2016-12-29

## 2016-12-29 MED ORDER — NITROGLYCERIN 2 % TRANSDERMAL OINTMENT - PACKET
1.00 [in_us] | TOPICAL_OINTMENT | Freq: Every day | TRANSDERMAL | Status: DC | PRN
Start: 2016-12-29 — End: 2016-12-29
  Administered 2016-12-29: 0 [in_us] via TOPICAL
  Filled 2016-12-29: qty 60

## 2016-12-29 MED ORDER — HEPARIN (PORCINE) 1,000 UNITS/ML BOLUS
80.0000 [IU]/kg | Freq: Once | INTRAMUSCULAR | Status: DC
Start: 2016-12-29 — End: 2016-12-29
  Filled 2016-12-29: qty 7

## 2016-12-29 MED ORDER — FENTANYL (PF) 50 MCG/ML INJECTION SOLUTION
50.00 ug | INTRAMUSCULAR | Status: AC
Start: 2016-12-29 — End: 2016-12-29
  Administered 2016-12-29: 50 ug via INTRAVENOUS
  Filled 2016-12-29: qty 2

## 2016-12-29 MED ORDER — ONDANSETRON HCL (PF) 4 MG/2 ML INJECTION SOLUTION
4.00 mg | Freq: Four times a day (QID) | INTRAMUSCULAR | Status: DC | PRN
Start: 2016-12-29 — End: 2016-12-29

## 2016-12-29 MED ORDER — CARVEDILOL 25 MG TABLET
25.00 mg | ORAL_TABLET | Freq: Two times a day (BID) | ORAL | Status: DC
Start: 2016-12-29 — End: 2016-12-29
  Filled 2016-12-29: qty 1

## 2016-12-29 MED ORDER — HYDRALAZINE 50 MG TABLET
100.00 mg | ORAL_TABLET | Freq: Three times a day (TID) | ORAL | Status: DC
Start: 2016-12-29 — End: 2016-12-29
  Filled 2016-12-29 (×2): qty 2

## 2016-12-29 MED ORDER — SEVELAMER HCL 800 MG TABLET
800.00 mg | ORAL_TABLET | Freq: Three times a day (TID) | ORAL | Status: DC
Start: 2016-12-29 — End: 2016-12-29
  Filled 2016-12-29 (×2): qty 1

## 2016-12-29 MED ORDER — SODIUM CHLORIDE 0.9 % (FLUSH) INJECTION SYRINGE
3.0000 mL | INJECTION | INTRAMUSCULAR | Status: DC | PRN
Start: 2016-12-29 — End: 2016-12-29

## 2016-12-29 MED ORDER — ALUMINUM-MAG HYDROXIDE-SIMETHICONE 200 MG-200 MG-20 MG/5 ML ORAL SUSP
30.0000 mL | ORAL | Status: DC | PRN
Start: 2016-12-29 — End: 2016-12-29

## 2016-12-29 MED ADMIN — acetaminophen 325 mg tablet: INTRAVENOUS | @ 09:00:00

## 2016-12-29 MED ADMIN — nitroglycerin 2 % transdermal ointment: TOPICAL | @ 13:00:00

## 2016-12-29 MED ADMIN — fentaNYL (PF) 50 mcg/mL injection solution: INTRAVENOUS | @ 10:00:00

## 2016-12-29 MED ADMIN — lanolin-oxyquin-pet, hydrophil topical ointment: SUBLINGUAL | @ 09:00:00 | NDC 09999989257

## 2016-12-29 NOTE — Discharge Summary (Signed)
PATIENT NAME: [name], [name]  HOSPITAL NUMBER:  [id number]  ADMIT DATE:  12/29/2016  DISCHARGE DATE:  12/29/2016  DATE OF BIRTH:  1971-03-31    DISCHARGE SUMMARY      HOSPITAL COURSE:  The patient came in with chest pain, pressure-like sensation.  Blood pressure was elevated at 199/94.  The patient was started on nitroglycerin paste.  The patient refused the paste, got angry and left AMA.  When reviewing his chart, the patient [facility] 2016 and December 27, 2016.  The [facility] [place] on the December 23, 2016.  [facility] December 20, 2016.  The patient left AMA.  The patient was recommended to stay to be worked up for his chest pain, but refused.        Genia HaroldMohamed A Sabbagh, MD            DD:  12/29/2016 13:30:52  DT:  12/29/2016 14:10:59 CW  [name]#:  [id number]

## 2016-12-29 NOTE — Nurses Notes (Signed)
Patient requesting to leave AMA. [name] was paged and no answer so left a voicemail.   Conley RollsSamantha Marie Sahian Kerney, RN

## 2016-12-29 NOTE — ED Nurses Note (Signed)
Administered second SL nitro 0.4mg 

## 2016-12-29 NOTE — Care Management Notes (Signed)
No needs identified at this time for d/c needs. Case management to f/u on unit.Colon Flatteryachel Verner Mccrone, CASE MANAGER  12/29/2016, 13:49

## 2016-12-29 NOTE — ED Provider Notes (Signed)
Department of Emergency Medicine  HPI - 12/29/2016    Attending: Dr. Omar Person  Chief Complaint:  Chief Complaint   Patient presents with   . Chest Pain     return for CP, started again last night around midnight, was seen last week here for similiar s/s, reports hx MI   . Vomiting     vomiting and diarrhea since Sunday, pt reports unable to tolerate PO intake, hx HTN, states has not had HTN meds since Sunday d/t N/V   . Diarrhea     History of Present Illness:  [name] is a 46 y.o. male with a PMH of HTN, ESRD, CAD, MI, C-diff, who presents via POV with a chief complaint of L sided chest pain that began around midnight. Patient states the pain radiates to his L arm and L jaw. Describes pain as someone sitting on his chest. Patient reports prior history of MI in 2010 and says his pain feels similar. Patient says his last stress test and heart catheterization was in 2010. He also reports nausea, vomiting, and diarrhea that began 3 days ago. Says he has been unable to tolerate PO. Patient reports he had C-diff last month and is afraid he has it again. Of note, patient says he has noticed his abdomen is distended with associated pain. Says he has been told he has fluid collecting in his abdomen, but they are unsure why. Denies alcohol or drug use. Denies all other complaints at this time. Patient is on dialysis and states he has MWF treatment; last had dialysis on 2 days ago. He reports he lives in [place] and is in the process of moving here. Says he does not have any doctors here.     History Limitations: None    Review of Systems:  Constitutional: No weakness +fever  Skin: No rashes or diaphoresis  HENT: No congestion or rhinorrhea  Eyes: No vision changes, discharge  Cardio: No palpitations or leg swelling +chest pain  Respiratory: No cough, wheezing or SOB  GI:  No constipation +nausea, vomiting, diarrhea, unable to tolerate PO, abdominal pain and distention  GU:  No dysuria, hematuria, polyuria  MSK: No joint  or back pain  Neuro: No loss of sensation, headache, focal deficits or LOC      Allergies:  Allergies   Allergen Reactions   . Lisinopril      Lip swelling     . Morphine      Throat swelling. Has gotten dilaudid without issue.      . Toradol [Ketorolac]      Breathing issues     Past Medical History:  Past Medical History:   Diagnosis Date   . Ascites    . Asthma    . Clostridium difficile infection 08/28/2016    +  C Diff 08/2016; C.diff + 10/07/16   . Coronary artery disease    . Dialysis patient (CMS HCC)    . HTN (hypertension)    . Kidney failure    . MI, old    . Wears dentures     Just upper   . Wears glasses          Past Surgical History:  Past Surgical History:   Procedure Laterality Date   . AV FISTULA PLACEMENT     . CORONARY ARTERY ANGIOPLASTY     . 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 HX CORONARY STENT PLACEMENT     .  HX HERNIA REPAIR           Social History:  Social History   Substance Use Topics   . Smoking status: Current Every Day Smoker     Packs/day: 0.25     Years: 30.00     Types: Cigarettes   . Smokeless tobacco: Never Used   . Alcohol use No     Family History:  Family Medical History     Problem Relation (Age of Onset)    Healthy Father    Hypertension Mother            Physical Exam:  All nurse's notes reviewed.  Filed Vitals:    12/29/16 0900 12/29/16 0930 12/29/16 1014 12/29/16 1100   BP: (!) 174/117 (!) 183/118  (!) 199/94   Pulse: (!) 102 94  100   Resp: (!) 22 (!) 22  (!) 21   Temp:       SpO2: 100% 100% 100% 99%      Constitutional: No acute distress.  Alert and Oriented x3.  HENT:   Head: Normocephalic, Atraumatic  Mouth/Throat: Oropharynx is clear and moist.  Eyes:Conjunctivae without discharge.  Neck: Trachea midline.   Cardiovascular: Borderline tachycardia and regular rhythm  Pulmonary/Chest: Breath sounds equal bilaterally, good air movement. No respiratory distress. No wheezes, rales or chest tenderness.  Abdominal: Normal Bowel Sounds. Distended and tense.  Diffuse tenderness. No rebound or guarding.  Musculoskeletal: No obvious deformity. 2+ peripheral pulses BLE 2+ edema.  Skin: Warm and dry. No rash, erythema, pallor or cyanosis.  Psychiatric: Behavior is normal. Mood and affect congruent.  Neurological: Alert & Oriented x3. Grossly intact. Moves all extremities spontaneously.    Orders, Abnormal Labs and Imaging Results:  Results up to the Time the Disposition was Entered   BASIC METABOLIC PANEL - Abnormal; Notable for the following:        Result Value    CALCIUM 6.7 (*)     GLUCOSE 115 (*)     BUN 38 (*)     CREATININE 8.31 (*)     ESTIMATED GFR 8 (*)     All other components within normal limits   PT/INR - Abnormal; Notable for the following:     INR 1.13 (*)     All other components within normal limits   TROPONIN-I - Abnormal; Notable for the following:     TROPONIN I 0.07 (*)     All other components within normal limits   CBC WITH DIFF - Abnormal; Notable for the following:     RBC 2.87 (*)     HGB 9.1 (*)     HCT 26.6 (*)     RDW 16.0 (*)     PLATELETS 119 (*)     LYMPHOCYTE # 0.40 (*)     All other components within normal limits   ECG 12 LEAD - ED USE    Narrative:     -------------------------ECG Interpretation-----------------------------------  Baseline wander in lead(s) V6  Sinus tachycardia  Left atrial enlargement  RSR' in V1 or V2, probably normal variant  Nonspecific T abnormalities, lateral leads  Borderline prolonged QT interval  MD Signature: Unconfirmed Diagnosis   CBC/DIFF    Narrative:     The following orders were created for panel order CBC/DIFF.  Procedure                               Abnormality  Status                     ---------                               -----------         ------                     CBC WITH DIFF[[id number]]                Abnormal            Final result                 Please view results for these tests on the individual orders.   XR CHEST AP    Narrative:     [name]    PROCEDURE DESCRIPTION: XR  CHEST AP    PROCEDURE PERFORMED DATE AND TIME: 12/29/2016 8:53 AM    CLINICAL INDICATION: chest pain fever dialysis patient MI history    TECHNIQUE: 1 views / 1 images submitted.    COMPARISON: October 07, 2016      FINDINGS: Large bore double lumen catheter noted in place on the left with  its tip projecting over the right atrium. Endovascular stents are noted  projecting in the subclavian/innominate region on the right mild  cardiomegaly is noted. Mild congestion is suspected. No pleural effusions  are suspected. There is a suboptimal level of inflation of the lung fields.  Crowding of lung markings in the bases is noted. No confluent infiltrates  are suspected however.     nitroGLYCERIN (NITROSTAT) sublingual tablet (0.4 mg Sublingual Given 12/29/16 0900)   ondansetron (ZOFRAN) 2 mg/mL injection (4 mg Intravenous Given 12/29/16 0831)   fentaNYL (SUBLIMAZE) 50 mcg/mL injection (50 mcg Intravenous Given 12/29/16 1014)       EKG: Reviewed by me  MDM:   ED Course   Antonietta JewelMace, Dorien Mayotte J's Documentation   Comment Time   I discussed the case I discussed the case with Dr. Odis Hollingsheadamanathan in from hospitalist who states that he was recently on Dr. Courtney HeysSabbagh service.  Dr. Odis Hollingsheadamanathan and notes that the patient has had what seems to be history of drug-seeking behavior and doctor shopping.  This is substantiated by the fact that the patient has had multiple admissions to multiple facilities.  He signs out against medical advice and the notes say that he does not get his requested pain regimen so therefore he signed himself out.  Dr. Courtney HeysSabbagh will admit him for observation from a cardiac standpoint. 09/12 1014       Critical care time: 0  Consults: Spoke with Dr. Dorna LeitzM Sabbagh  Impression:   Encounter Diagnoses   Name Primary?   . Chest pain, unspecified type Yes   . ESRD (end stage renal disease) (CMS HCC)      Disposition:  Admitted  Admission: Patient will be admitted to Dr. Verlon AuM Sabbagh's service for further evaluation and management.      Follow Up:    No follow-up provider specified.  Prescriptions:   Discharge Medication List as of 12/29/2016  1:03 PM             No future appointments.      I am scribing for, and in the presence of, Dr. Omar PersonMace, for services provided on 12/29/2016.  Helene KelpLindsay Miller, SCRIBE      I personally  performed the services described in this documentation, as scribed  in my presence, and it is both accurate  and complete.    Wyatt Haste, MD

## 2016-12-29 NOTE — H&P (Signed)
PATIENT NAME: [name], [name]  HOSPITAL NUMBER:  [id number]  DATE OF SERVICE: 12/29/2016  DATE OF BIRTH:  01-May-1970    HISTORY AND PHYSICAL    AMENDED REPORT:      CHIEF COMPLAINT:  Chest pain.     The patient came in with chest pain and pressure-like sensation. The patient had elevated blood pressure and was started on nitroglycerin paste. The patient refused nitroglycerin paste and left AMA without being seen. The patient was recommended to stay and be worked up for his chest pain but patient refused and left AMA.     AMENDMENT     Changed nitroglycerin drip to nitroglycerin paste per Dr. Judie PetitM. Courtney HeysSabbagh.        Genia HaroldMohamed A Sabbagh, MD                DD:  12/29/2016 13:29:38  DT:  12/29/2016 13:59:58 JR  [name]#:  [id number]

## 2016-12-30 LAB — EKG 12-LEAD
Atrial Rate: 87 {beats}/min
P Axis: 59 degrees
P-R Interval: 192 ms
Q-T Interval: 406 ms
QRS Duration: 86 ms
QTc Calculation (Bazett): 488 ms
R Axis: -19 degrees
T Axis: 99 degrees
Ventricular Rate: 87 {beats}/min

## 2017-02-04 ENCOUNTER — Emergency Department: Admit: 2017-02-04 | Payer: MEDICARE

## 2017-02-04 ENCOUNTER — Inpatient Hospital Stay
Admit: 2017-02-04 | Discharge: 2017-02-06 | Discharge status: Against Medical Advice | Payer: MEDICARE | Attending: General Acute Care Hospital | Admitting: General Acute Care Hospital

## 2017-02-04 ENCOUNTER — Inpatient Hospital Stay

## 2017-02-04 DIAGNOSIS — I120 Hypertensive chronic kidney disease with stage 5 chronic kidney disease or end stage renal disease: Secondary | ICD-10-CM

## 2017-02-04 LAB — CBC WITH AUTOMATED DIFF
ABS. BASOPHILS: 0.1 10*3/uL (ref 0.0–0.1)
ABS. EOSINOPHILS: 0.3 10*3/uL (ref 0.0–0.4)
ABS. IMM. GRANS.: 0.1 10*3/uL — ABNORMAL HIGH (ref 0.00–0.04)
ABS. LYMPHOCYTES: 0.4 10*3/uL — ABNORMAL LOW (ref 0.8–3.5)
ABS. MONOCYTES: 0.4 10*3/uL (ref 0.0–1.0)
ABS. NEUTROPHILS: 5.1 10*3/uL (ref 1.8–8.0)
ABSOLUTE NRBC: 0 10*3/uL (ref 0.00–0.01)
BASOPHILS: 1 % (ref 0–1)
EOSINOPHILS: 5 % (ref 0–7)
HCT: 25.5 % — ABNORMAL LOW (ref 36.6–50.3)
HGB: 8 g/dL — ABNORMAL LOW (ref 12.1–17.0)
IMMATURE GRANULOCYTES: 1 % — ABNORMAL HIGH (ref 0.0–0.5)
LYMPHOCYTES: 6 % — ABNORMAL LOW (ref 12–49)
MCH: 30 PG (ref 26.0–34.0)
MCHC: 31.4 g/dL (ref 30.0–36.5)
MCV: 95.5 FL (ref 80.0–99.0)
MONOCYTES: 7 % (ref 5–13)
MPV: 9.5 FL (ref 8.9–12.9)
NEUTROPHILS: 80 % — ABNORMAL HIGH (ref 32–75)
NRBC: 0 PER 100 WBC
PLATELET: 140 10*3/uL — ABNORMAL LOW (ref 150–400)
RBC: 2.67 M/uL — ABNORMAL LOW (ref 4.10–5.70)
RDW: 17 % — ABNORMAL HIGH (ref 11.5–14.5)
WBC: 6.4 10*3/uL (ref 4.1–11.1)

## 2017-02-04 LAB — METABOLIC PANEL, COMPREHENSIVE
A-G Ratio: 0.7 — ABNORMAL LOW (ref 1.1–2.2)
ALT (SGPT): 17 U/L (ref 12–78)
AST (SGOT): 25 U/L (ref 15–37)
Albumin: 2.9 g/dL — ABNORMAL LOW (ref 3.5–5.0)
Alk. phosphatase: 254 U/L — ABNORMAL HIGH (ref 45–117)
Anion gap: 10 mmol/L (ref 5–15)
BUN/Creatinine ratio: 6 — ABNORMAL LOW (ref 12–20)
BUN: 71 MG/DL — ABNORMAL HIGH (ref 6–20)
Bilirubin, total: 0.3 MG/DL (ref 0.2–1.0)
CO2: 23 mmol/L (ref 21–32)
Calcium: 5.9 MG/DL — CL (ref 8.5–10.1)
Chloride: 105 mmol/L (ref 97–108)
Creatinine: 12.7 MG/DL — ABNORMAL HIGH (ref 0.70–1.30)
GFR est AA: 5 mL/min/{1.73_m2} — ABNORMAL LOW (ref >60)
GFR est non-AA: 4 mL/min/{1.73_m2} — ABNORMAL LOW (ref >60)
Globulin: 4 g/dL (ref 2.0–4.0)
Glucose: 84 mg/dL (ref 65–100)
Potassium: 5.8 mmol/L — ABNORMAL HIGH (ref 3.5–5.1)
Protein, total: 6.9 g/dL (ref 6.4–8.2)
Sodium: 138 mmol/L (ref 136–145)

## 2017-02-04 LAB — TROPONIN I: Troponin-I, Qt.: 0.08 ng/mL — ABNORMAL HIGH (ref <0.05)

## 2017-02-04 LAB — CK-MB,QUANT.
CK - MB: 31.5 NG/ML — ABNORMAL HIGH (ref <3.6)
CK-MB Index: 2.1 (ref 0–2.5)

## 2017-02-04 LAB — LIPASE: Lipase: 217 U/L (ref 73–393)

## 2017-02-04 LAB — CK W/ REFLX CKMB: CK: 1497 U/L — ABNORMAL HIGH (ref 39–308)

## 2017-02-04 MED ORDER — SODIUM CHLORIDE 0.9 % IJ SYRG
INTRAMUSCULAR | Status: DC | PRN
Start: 2017-02-04 — End: 2017-02-06

## 2017-02-04 MED ORDER — ONDANSETRON (PF) 4 MG/2 ML INJECTION
4 mg/2 mL | INTRAMUSCULAR | Status: AC
Start: 2017-02-04 — End: 2017-02-04
  Administered 2017-02-04: 22:00:00 via INTRAVENOUS

## 2017-02-04 MED ORDER — SODIUM CHLORIDE 0.9 % IJ SYRG
Freq: Three times a day (TID) | INTRAMUSCULAR | Status: DC
Start: 2017-02-04 — End: 2017-02-06
  Administered 2017-02-04 – 2017-02-05 (×4): via INTRAVENOUS

## 2017-02-04 MED ORDER — ONDANSETRON (PF) 4 MG/2 ML INJECTION
4 mg/2 mL | INTRAMUSCULAR | Status: DC
Start: 2017-02-04 — End: 2017-02-04
  Administered 2017-02-04: 20:00:00 via INTRAVENOUS

## 2017-02-04 MED ORDER — NITROGLYCERIN 0.4 MG SUBLINGUAL TAB
0.4 mg | SUBLINGUAL | Status: AC
Start: 2017-02-04 — End: 2017-02-04
  Administered 2017-02-04: 22:00:00 via SUBLINGUAL

## 2017-02-04 MED ORDER — DIATRIZOATE MEGLUMINE & SODIUM 66 %-10 % ORAL SOLN
66-10 % | ORAL | Status: AC
Start: 2017-02-04 — End: 2017-02-04
  Administered 2017-02-04: 22:00:00 via ORAL

## 2017-02-04 MED ORDER — PROCHLORPERAZINE EDISYLATE 5 MG/ML INJECTION
5 mg/mL | INTRAMUSCULAR | Status: AC
Start: 2017-02-04 — End: 2017-02-04
  Administered 2017-02-04: 23:00:00 via INTRAVENOUS

## 2017-02-04 MED ORDER — FENTANYL CITRATE (PF) 50 MCG/ML IJ SOLN
50 mcg/mL | INTRAMUSCULAR | Status: AC
Start: 2017-02-04 — End: 2017-02-04
  Administered 2017-02-04: 23:00:00 via INTRAVENOUS

## 2017-02-04 MED ORDER — ASPIRIN 81 MG CHEWABLE TAB
81 mg | ORAL | Status: AC
Start: 2017-02-04 — End: 2017-02-04
  Administered 2017-02-04: 22:00:00 via ORAL

## 2017-02-04 MED ORDER — ONDANSETRON (PF) 4 MG/2 ML INJECTION
4 mg/2 mL | INTRAMUSCULAR | Status: AC
Start: 2017-02-04 — End: 2017-02-05
  Administered 2017-02-05: 05:00:00 via INTRAVENOUS

## 2017-02-04 MED ORDER — DICYCLOMINE 20 MG TAB
20 mg | ORAL | Status: DC
Start: 2017-02-04 — End: 2017-02-04
  Administered 2017-02-04: 20:00:00 via ORAL

## 2017-02-04 MED FILL — ONDANSETRON (PF) 4 MG/2 ML INJECTION: 4 mg/2 mL | INTRAMUSCULAR | Qty: 2

## 2017-02-04 MED FILL — PROCHLORPERAZINE EDISYLATE 5 MG/ML INJECTION: 5 mg/mL | INTRAMUSCULAR | Qty: 2

## 2017-02-04 MED FILL — NITROSTAT 0.4 MG SUBLINGUAL TABLET: 0.4 mg | SUBLINGUAL | Qty: 1

## 2017-02-04 MED FILL — MD-GASTROVIEW 66 %-10 % ORAL SOLUTION: 66-10 % | ORAL | Qty: 30

## 2017-02-04 MED FILL — FENTANYL CITRATE (PF) 50 MCG/ML IJ SOLN: 50 mcg/mL | INTRAMUSCULAR | Qty: 2

## 2017-02-04 MED FILL — CHILDREN'S ASPIRIN 81 MG CHEWABLE TABLET: 81 mg | ORAL | Qty: 4

## 2017-02-04 NOTE — ED Notes (Signed)
Per Dr Jamison NeighborGelrud consults be completed in am on 10/20 routine.

## 2017-02-04 NOTE — ED Notes (Signed)
Bedside and Verbal shift change report given to Lucia GaskinsHayley O, RN (oncoming nurse) by Lennart PallBrittany M, RN (offgoing nurse). Report included the following information SBAR, Kardex, ED Summary, Intake/Output, MAR, Recent Results and Med Rec Status.

## 2017-02-04 NOTE — Progress Notes (Signed)
TRANSFER - IN REPORT:    Verbal report received from Hayley(name) on David [name]  being received from ED(unit) for routine progression of care      Report consisted of patient???s Situation, Background, Assessment and   Recommendations(SBAR).     Information from the following report(s) SBAR, Kardex, ED Summary, Procedure Summary, MAR, Recent Results and Cardiac Rhythm (NSR) was reviewed with the receiving nurse.    Opportunity for questions and clarification was provided.      Assessment completed upon patient???s arrival to unit and care assumed.

## 2017-02-04 NOTE — ED Notes (Signed)
CT aware that pt has drank half of contrast drink, MD order to continue with scan at this time.

## 2017-02-04 NOTE — ED Notes (Signed)
Attempting to take pt to CT, pt unable to drink contrast at this time. Dr Katrinka Blazingsmith made aware.    Order placed for compazine and pt aware that he needs to continue to drink contrast prior to CT.     WIll call CT once pt is able to drink more contrast.

## 2017-02-04 NOTE — ED Provider Notes (Signed)
4:37 PM  [employer], PA-C has evaluated the patient in JET. Pt presents to the ED with c/o chest pain, nausea/vomiting/diarrhea.  Pt relates his chest pain to "when I had a heart attack" in 2010. He states his cardiologist at the time was out of state.  No current cardiologist.  Pt also with h/o C diff and unsure if pain is radiating into his chest from his abdomen.   They have reviewed the vital signs and the triage nurse assessment. They have talked with the patient and any available family and advised that the appropriate studies have been ordered to initiate the work up based on the clinical presentation during the assessment. The pt has been advised that they will be accommodated in the Main ED as soon as possible. The pt has been requested to contact the triage nurse or PIT immediately if they experiences any changes in their condition during this brief waiting period.    EMERGENCY DEPARTMENT HISTORY AND PHYSICAL EXAM      Date: 02/04/2017  Patient Name: David [name]    History of Presenting Illness     Chief Complaint   Patient presents with   ??? Chest Pain     Ambulatory into the ED with c/o Lt sided CP x this am and LLQ abdominal pain radiating across his abdomen accompanied by n/v/d x 10/14. Treated for c-diff last month.   ??? Abdominal Pain   ??? Diarrhea   ??? Vomiting       History Provided By: Patient    HPI: David [name], 46 y.o. male with PMHx significant for CKD / ascites / asthma / HTN, presents ambulatory to the ED with cc of constant diffuse L sided abdominal pain that radiates to R side with associated vomiting and diarrhea x 5 days. Pt reports 8 episodes of diarrhea and 6 episodes of vomiting. Pt discloses hx of similar sxs noting hx of cdiff. Pt reports 101 F fever yesterday. Pt endorses diffuse CP that radiates to his LUE and L jaw with associated SOB that began this morning noting it became constant at 1300 and endorses that it feels like an MI noting. Pt  describes pain as 10/10 in severity. Pt reports BLE edema with onset this morning. Pt reports he gets dialysis Monday/Wednesday/Friday noting he did not go to his last 2 appointments. Pt discloses he was prescribed Vancomycin and Flagyl for his cdiff last month. Pt discloses he is always on oxygen. Pt denies recent travel. Pt denies having a Cardiologist. Pt denies HA, diaphoresis, or urinary sxs.     Social Hx: +tobacco(0.15 ppd, trying to quit), -EtOH    There are no other complaints, changes, or physical findings at this time.    PCP: UNKNOWN    Current Facility-Administered Medications   Medication Dose Route Frequency Provider Last Rate Last Dose   ??? sodium chloride (NS) flush 5-10 mL  5-10 mL IntraVENous Q8H Jerilee Field [employer], [place]   10 mL at 02/04/17 1804   ??? sodium chloride (NS) flush 5-10 mL  5-10 mL IntraVENous PRN Jerilee Field M, [place]       ??? ondansetron (ZOFRAN) injection 4 mg  4 mg IntraVENous NOW Seward Speck, MD         Current Outpatient Medications   Medication Sig Dispense Refill   ??? hydrALAZINE (APRESOLINE) 100 mg tablet Take  by mouth three (3) times daily.     ??? amLODIPine (NORVASC) 10 mg tablet Take  by mouth daily.     ???  carvedilol (COREG) 12.5 mg tablet Take 12.5 mg by mouth two (2) times daily (with meals).     ??? sevelamer (RENAGEL) 800 mg tablet Take  by mouth three (3) times daily (with meals).     ??? albuterol (PROVENTIL HFA, VENTOLIN HFA, PROAIR HFA) 90 mcg/actuation inhaler Take  by inhalation.     ??? furosemide (LASIX) 80 mg tablet Take 80 mg by mouth daily.         Past History     Past Medical History:  Past Medical History:   Diagnosis Date   ??? Asthma    ??? Chronic kidney disease    ??? Hypertension    ??? Ill-defined condition     Ascites       Past Surgical History:  Past Surgical History:   Procedure Laterality Date   ??? HX GI      Groin & Hernia repair   ??? HX VASCULAR ACCESS      PD Catheter placed and removed       Family History:  Family History   Problem Relation Age of Onset    ??? Hypertension Other        Social History:  Social History     Tobacco Use   ??? Smoking status: Current Every Day Smoker     Packs/day: 0.25   ??? Tobacco comment: 3 cigs. daily   Substance Use Topics   ??? Alcohol use: No     Frequency: Never   ??? Drug use: No       Allergies:  Allergies   Allergen Reactions   ??? Lisinopril Swelling   ??? Morphine Swelling   ??? Toradol [Ketorolac] Swelling         Review of Systems   Review of Systems   Constitutional: Positive for fever (101 F). Negative for diaphoresis.   HENT: Negative for congestion.    Eyes: Negative.    Respiratory: Positive for shortness of breath.    Cardiovascular: Positive for chest pain.   Gastrointestinal: Positive for abdominal pain, diarrhea, nausea and vomiting.   Endocrine: Negative for heat intolerance.   Genitourinary: Negative.    Musculoskeletal: Positive for arthralgias (L jaw) and myalgias (LUE).   Skin: Negative for rash.   Allergic/Immunologic: Negative for immunocompromised state.   Neurological: Negative for numbness.   Hematological: Does not bruise/bleed easily.   Psychiatric/Behavioral: Negative.    All other systems reviewed and are negative.      Physical Exam   Physical Exam   Constitutional: He is oriented to person, place, and time. He appears well-developed and well-nourished. He appears distressed (mild).   Elevated BMI   HENT:   Head: Normocephalic and atraumatic.   Eyes: EOM are normal. Pupils are equal, round, and reactive to light.   Neck: Normal range of motion. Neck supple.   Cardiovascular: Normal rate, regular rhythm and normal heart sounds.   Pulmonary/Chest: Effort normal and breath sounds normal. He has no wheezes.   Dialysis catheter L chest  Chest wall non-tender   Abdominal: Soft. Bowel sounds are normal.   BLQ tenderness  Mild LUQ tenderness   Musculoskeletal: Normal range of motion. He exhibits no edema or tenderness.   3+ edema BLE, non-tender   Neurological: He is alert and oriented to person, place, and time. No  cranial nerve deficit.   Skin: Skin is warm and dry.   Psychiatric: He has a normal mood and affect. His behavior is normal.   Nursing note and vitals reviewed.  Diagnostic Study Results     Labs -     Recent Results (from the past 12 hour(s))   EKG, 12 LEAD, INITIAL    Collection Time: 02/04/17  3:25 PM   Result Value Ref Range    Ventricular Rate 99 BPM    Atrial Rate 99 BPM    P-R Interval 188 ms    QRS Duration 84 ms    Q-T Interval 380 ms    QTC Calculation (Bezet) 487 ms    Calculated P Axis 63 degrees    Calculated R Axis -23 degrees    Calculated T Axis 86 degrees    Diagnosis       Normal sinus rhythm  Possible Left atrial enlargement  Prolonged QT  No previous ECGs available     CBC WITH AUTOMATED DIFF    Collection Time: 02/04/17  6:00 PM   Result Value Ref Range    WBC 6.4 4.1 - 11.1 K/uL    RBC 2.67 (L) 4.10 - 5.70 M/uL    HGB 8.0 (L) 12.1 - 17.0 g/dL    HCT 25.5 (L) 36.6 - 50.3 %    MCV 95.5 80.0 - 99.0 FL    MCH 30.0 26.0 - 34.0 PG    MCHC 31.4 30.0 - 36.5 g/dL    RDW 17.0 (H) 11.5 - 14.5 %    PLATELET 140 (L) 150 - 400 K/uL    MPV 9.5 8.9 - 12.9 FL    NRBC 0.0 0 PER 100 WBC    ABSOLUTE NRBC 0.00 0.00 - 0.01 K/uL    NEUTROPHILS 80 (H) 32 - 75 %    LYMPHOCYTES 6 (L) 12 - 49 %    MONOCYTES 7 5 - 13 %    EOSINOPHILS 5 0 - 7 %    BASOPHILS 1 0 - 1 %    IMMATURE GRANULOCYTES 1 (H) 0.0 - 0.5 %    ABS. NEUTROPHILS 5.1 1.8 - 8.0 K/UL    ABS. LYMPHOCYTES 0.4 (L) 0.8 - 3.5 K/UL    ABS. MONOCYTES 0.4 0.0 - 1.0 K/UL    ABS. EOSINOPHILS 0.3 0.0 - 0.4 K/UL    ABS. BASOPHILS 0.1 0.0 - 0.1 K/UL    ABS. IMM. GRANS. 0.1 (H) 0.00 - 0.04 K/UL    DF AUTOMATED      RBC COMMENTS ANISOCYTOSIS  1+       METABOLIC PANEL, COMPREHENSIVE    Collection Time: 02/04/17  6:00 PM   Result Value Ref Range    Sodium 138 136 - 145 mmol/L    Potassium 5.8 (H) 3.5 - 5.1 mmol/L    Chloride 105 97 - 108 mmol/L    CO2 23 21 - 32 mmol/L    Anion gap 10 5 - 15 mmol/L    Glucose 84 65 - 100 mg/dL    BUN 71 (H) 6 - 20 MG/DL     Creatinine 12.70 (H) 0.70 - 1.30 MG/DL    BUN/Creatinine ratio 6 (L) 12 - 20      GFR est AA 5 (L) >60 ml/min/1.86m    GFR est non-AA 4 (L) >60 ml/min/1.72m   Calcium 5.9 (LL) 8.5 - 10.1 MG/DL    Bilirubin, total 0.3 0.2 - 1.0 MG/DL    ALT (SGPT) 17 12 - 78 U/L    AST (SGOT) 25 15 - 37 U/L    Alk. phosphatase 254 (H) 45 - 117 U/L    Protein, total 6.9 6.4 - 8.2  g/dL    Albumin 2.9 (L) 3.5 - 5.0 g/dL    Globulin 4.0 2.0 - 4.0 g/dL    A-G Ratio 0.7 (L) 1.1 - 2.2     CK W/ REFLX CKMB    Collection Time: 02/04/17  6:00 PM   Result Value Ref Range    CK 1,497 (H) 39 - 308 U/L   TROPONIN I    Collection Time: 02/04/17  6:00 PM   Result Value Ref Range    Troponin-I, Qt. 0.08 (H) <0.05 ng/mL   LIPASE    Collection Time: 02/04/17  6:00 PM   Result Value Ref Range    Lipase 217 73 - 393 U/L   CK-MB,QUANT.    Collection Time: 02/04/17  6:00 PM   Result Value Ref Range    CK - MB 31.5 (H) <3.6 NG/ML    CK-MB Index 2.1 0 - 2.5         Radiologic Studies -   CT ABD PELV WO CONT   Final Result      XR CHEST PA LAT   Final Result        CT Results  (Last 48 hours)               02/04/17 2016  CT ABD PELV WO CONT Final result    Impression:  IMPRESSION:   1. No apparent inflammation.   2. Ascites, pleural effusions and third spacing body wall edema.           Narrative:  INDICATION: Abdominal pain; Diarrhea        EXAM: CT Abdomen and Pelvis without contrast.   CT dose reduction was achieved through use of a standardized protocol tailored   for this examination and automatic exposure control for dose modulation.       FINDINGS:       There is a large amount of ascites. There are trace pleural effusions. There is   diffuse body wall edema/third spacing.       Kidneys are severely atrophic without stone or hydronephrosis.        Liver shows no apparent significant finding without contrast. Pancreas, adrenal   glands, spleen and aorta show no significant enlargement.  There is aortic   calcification.        No inflammation is seen. There is no pneumoperitoneum or significant adenopathy.       The bladder is empty. Oral contrast has reached the rectum. There is no bowel   dilation or apparent inflammation.               CXR Results  (Last 48 hours)               02/04/17 1634  XR CHEST PA LAT Final result    Impression:  IMPRESSION:   Cardiomegaly with mild pulmonary vascular congestion and trace pleural   effusions.            Narrative:  INDICATION:   Cardiac workup       COMPARISON: None       FINDINGS:       Frontal and lateral views of the chest demonstrate cardiomegaly. Left internal   jugular permacath. The lungs are adequately expanded.  Mild pulmonary vascular   congestion and trace pleural effusions. No pneumothorax. The osseous structures   are unremarkable.                   Medical Decision Making   I am  the first provider for this patient.    I reviewed the vital signs, available nursing notes, past medical history, past surgical history, family history and social history.    Vital Signs-Reviewed the patient's vital signs.  Patient Vitals for the past 12 hrs:   Temp Pulse Resp BP SpO2   02/04/17 1926 ??? 92 23 ??? 100 %   02/04/17 1855 ??? 97 22 (!) 152/107 100 %   02/04/17 1749 ??? 92 ??? (!) 174/112 ???   02/04/17 1734 ??? 94 23 ??? 100 %   02/04/17 1523 98.1 ??F (36.7 ??C) (!) 101 18 (!) 183/111 100 %       EKG interpretation: (Preliminary) 15:25  Rhythm: normal sinus rhythm; and regular . Rate (approx.): 99; Axis: normal; PR interval: normal; QRS interval: prolonged; ST/T wave: normal.  Written by Lolita Lenz, ED Scribe, as dictated by Seward Speck, MD.    Records Reviewed: Nursing Notes    Provider Notes (Medical Decision Making):   DDx: ACS, fluid overload, CHF, gastritis, gastroenteritis, diverticulitis, colitis, electrolyte abnormality, dehydration    ED Course:   Initial assessment performed. The patients presenting problems have been discussed, and they are in agreement with the care plan formulated and  outlined with them.  I have encouraged them to ask questions as they arise throughout their visit.    Medications   sodium chloride (NS) flush 5-10 mL (10 mL IntraVENous Given 02/04/17 1804)   sodium chloride (NS) flush 5-10 mL (not administered)   ondansetron (ZOFRAN) injection 4 mg (not administered)   aspirin chewable tablet 324 mg (324 mg Oral Given 02/04/17 1749)   diatrizoate meg-diatrizoat sod (MD-GASTROVIEW,GASTROGRAFIN) 66-10 % contrast solution 30 mL (30 mL Oral Given 02/04/17 1750)   ondansetron (ZOFRAN) injection 4 mg (4 mg IntraVENous Given 02/04/17 1804)   nitroglycerin (NITROSTAT) tablet 0.4 mg (0.4 mg SubLINGual Given 02/04/17 1749)   fentaNYL citrate (PF) injection 100 mcg (100 mcg IntraVENous Given 02/04/17 1850)   prochlorperazine (COMPAZINE) with saline injection 10 mg (10 mg IntraVENous Given 02/04/17 1923)     Progress Note:  6:40 PM  Pt reports that the Nitro did not help his CP.     Progress Note:  7:04 PM  Pt reports his pain is 8/10 now. Pt states he is too nauseated to finish his contrast.     Consult Note:  7:17 PM  Seward Speck, MD spoke with Stark Jock, MD,  Specialty: Nephrology  Discussed pt's hx, disposition, and available diagnostic and imaging results. Reviewed care plans. Consultant agrees to evaluate pt.    Consult Note:  7:18 PM  Seward Speck, MD spoke with Marjorie Smolder, MD,  Specialty: Cardiology  Discussed pt's hx, disposition, and available diagnostic and imaging results. Reviewed care plans. Consultant states he will see the pt in the morning. Consultant requests to obtain another set of enzymes in 3-4 hours.    Consult Note:  7:33 PM  Seward Speck, MD spoke with Dr. Edd Arbour and Dr. Kaleen Odea,   Specialty: Hospitalist  Discussed pt's hx, disposition, and available diagnostic and imaging results. Reviewed care plans. Consultants agree to evaluate pt for admission once the CT results come back.  Written by Lolita Lenz, ED Scribe, as dictated by Seward Speck, MD.     SIGN OUT:  7:48 PM  Patient's presentation, labs/imaging and plan of care was reviewed with Carlton L. Zack Seal, MD as part of sign out.  They will obtain CT imaging and evaluate for admission as part of  the plan discussed with the patient.    Carlton L. Zack Seal, MD's assistance in completion of this plan is greatly appreciated but it should be noted that I will be the provider of record for this patient.    Seward Speck, MD      Consult Note:  7:22 PM  Serafina Mitchell. Zack Seal, MD spoke with Modesto Charon, MD,  Specialty: hospitalist  Discussed pt's hx, disposition, and available diagnostic and imaging results. Reviewed care plans. Consultant agrees with plans as outlined. Dr. Kaleen Odea will admit to hospital.      Critical Care Time: 0 Minutes    Disposition:  Admit Note:  7:34 PM  Pt is being admitted by Modesto Charon, MD. The results of their tests and reason(s) for their admission have been discussed with pt and/or available family. They convey agreement and understanding for the need to be admitted and for admission diagnosis.      PLAN:  1. Admit to Hospital      Diagnosis     Clinical Impression:   1. Acute chest pain    2. Acute abdominal pain    3. Nausea and vomiting, intractability of vomiting not specified, unspecified vomiting type    4. Diarrhea, unspecified type    5. Other ascites        Attestations:    This note is prepared by Lolita Lenz, acting as Scribe for Seward Speck, MD.    Seward Speck, MD: The scribe's documentation has been prepared under my direction and personally reviewed by me in its entirety. I confirm that the note above accurately reflects all work, treatment, procedures, and medical decision making performed by me.

## 2017-02-04 NOTE — H&P (Addendum)
Hospitalist Admission NoteNAME: David [name]   DOB:  12/01/1969   MRN:  [id number]     Date/Time:  02/04/2017 8:05 PM    Patient PCP: UNKNOWN  ________________________________________________________________________    Given the patient's current clinical presentation, I have a high level of concern for decompensation if discharged from the emergency department.  Complex decision making was performed, which includes reviewing the patient's available past medical records, laboratory results, and x-ray films.       My assessment of this patient's clinical condition and my plan of care is as follows.    Assessment / Plan:  Accelerated hypertension on benign essential hypertension  Hyperkalemia, acute  Acute renal failure on Chronic kidney disease on HD, non compliant with HD  Recurrent CDIFF  Chest pressure  Recurrent abdominal pain, generalized  -admit to tele  -use labetalol IV prn sbp >170  -resume home cardiac medications - he has not been compliant  -possible etiology of worsened ARF: volume losses due to n/v and diarrhea. Further missed HD x multiple.  -will require HD in AM given he has missed >2 HD sessions in a row  -currently without changes in ECG at this time c/w hyperkalemia - will monitor carefully - if changes then will need to have emergent HD  -had very similar admission in 11/2016 at [facility] under Dr Currie Paris for his chest pain presentation. Per Dr Currie Paris note: "Chart reviewed.  Multiple presentations for described symptom complex.  Negative cardiac studies and no objective findings.  Will SOR."   -will follow troponin and have VCS see patient in consultation for following, but at this time do not see need for further work up this evening with recent work up during last admission for active chest pain being unyielding. Of course given his renal disease, possible history for CAD and stenting (looking for reports, not finding at thsi time), he is at  risk for acute coronary syndrome. Regardless, in the setting of possible GI bleed (guaiac pos) and anemia we will not order asa/plavix.   -lipid panel pending  -npo after mn  -cdiff is an issue - has had >5 rounds of vancomycin/fidaxomycin or flagyl and all have failed. He has been set up for fecal transplant x multiple but has left AMA prior to all. Will have GI see and evaluate  -strict hand washing  -prior work up for similar/same abdominal pain yielded CT abd showing ascites, but Consider for possible ascites as prior. He remains afebrile, normal wbcThe patient is afebrile with normal white blood cell count and as such no convincing evidence for SBP (spontaneous bacterial peritonitis). Follow as above for now - repeat CT if post HD he is not improved.     Obstructive sleep apnea  -not on CPAP    Body mass index is 32.14 kg/m??. therefore classifying patient as obese, NOS (BMI of >30 kg/m2)  -counseled exercise/diet. Patient receptive.    I have personally reviewed the radiographs, laboratory data in Epic and decisions and statements above are based partially on this personal interpretation.    Code Status: Full Code  DVT Prophylaxis: Hep SQ  GI Prophylaxis: not indicated        Subjective:   CHIEF COMPLAINT: "i am having chest pain and diarrhea and am short of breath"    HISTORY OF PRESENT ILLNESS:     David [name] is a 46 y.o.  African American male presents to ED with complaint noted above. Available records were reviewed at the time of H&P. Patient gave  his name and DOB not as he has in the past - he is to be under David [name] DOB 24-May-2070 MRN: [id number] - registration is looking to correct.     Patient with PMH for end-stage renal disease on hemodialysis Mondays, Wednesdays, Fridays, ascites, asthma, obesity, chronic hypoxic respiratory failure, hypertension, myocardial infarction, coronary artery disease, obstructive sleep apnea, C. diff colitis, spontaneous bacterial  peritonitis just admitted and left AMA from [facility]. He has complex hx for the recurrence of cdiff failed therapy and was for stool transplant but left AMA. He now returns with x4 days of intermittent chest pressure/heaviness, missed HD, SOB and abdominal pain with intermittent diarrhea that occasionally is bloody in color. The chest heaviness "feels just like the time in 11/2016". He was at [facility] with similar presentation of abdominal pain, diarrhea - bloody, and chest pain seen by Cardiology Dr Currie Paris. Found no evidence to support ACS. Further only found ascites. He was dialyzed. During work up he opted to leave AMA and therefore further work up was uanble to be completed. Patient did not go to HD over this week therefore he has missed >2 sessions. He feels like his weight is stable. Poor PO intake and n/v with diarrhea that occasionally is bloody in character, noncramping. No fevers. No sick contacts. In ED noted to have mild increase in potassium to 5.8. No peaked T waves on ecg. Calcium found low at 5.9 corrects to 6.5 with albumin factored. No wbc. hgb low at 8. Vss. Trop 0.08 seems in line with his prior values from august.   No complaints of dizziness, lightheadedness, focal weakness, new onset of numbness, paresthesias, facial droop, headache, visual disturbance, neck pain, current shortness of breath, palpitations, calf pain and swelling, edema, rash, head or neck trauma or falls.   We were asked to see for work up and evaluation of the above problems.     Past Medical History:   Diagnosis Date   ??? Asthma    ??? Chronic kidney disease    ??? Hypertension    ??? Ill-defined condition     Ascites      Past Surgical History:   Procedure Laterality Date   ??? HX GI      Groin & Hernia repair   ??? HX VASCULAR ACCESS      PD Catheter placed and removed     Social History     Tobacco Use   ??? Smoking status: Current Every Day Smoker     Packs/day: 0.25   ??? Tobacco comment: 3 cigs. daily   Substance Use Topics    ??? Alcohol use: No     Frequency: Never      Family History   Problem Relation Age of Onset   ??? Hypertension Other         Allergies   Allergen Reactions   ??? Lisinopril Swelling   ??? Morphine Swelling   ??? Toradol [Ketorolac] Swelling        Prior to Admission medications    Medication Sig Start Date End Date Taking? Authorizing Provider   hydrALAZINE (APRESOLINE) 100 mg tablet Take  by mouth three (3) times daily.   Yes Other, Phys, MD   amLODIPine (NORVASC) 10 mg tablet Take  by mouth daily.   Yes Other, Phys, MD   carvedilol (COREG) 12.5 mg tablet Take 12.5 mg by mouth two (2) times daily (with meals).   Yes Other, Phys, MD   sevelamer (RENAGEL) 800 mg tablet Take  by mouth three (3) times daily (with meals).   Yes Other, Phys, MD   albuterol (PROVENTIL HFA, VENTOLIN HFA, PROAIR HFA) 90 mcg/actuation inhaler Take  by inhalation.   Yes Other, Phys, MD   furosemide (LASIX) 80 mg tablet Take 80 mg by mouth daily.   Yes Other, Phys, MD     REVIEW OF SYSTEMS:  See HPI for details  General: negative for fever, chills, sweats, weakness, weight loss  Eyes: negative for blurred vision, eye pain, loss of vision, diplopia  Ear Nose and Throat: negative for rhinorrhea, pharyngitis, otalgia, tinnitus, speech or swallowing difficulties  Respiratory:  negative for pleuritic pain, cough, sputum production, wheezing, ++SOB, DOE  Cardiology:  +chronic chest pain, no palpitations, orthopnea, PND, +LE edema, no syncope   Gastrointestinal: +abdominal pain, +N/V, no dysphagia, change in bowel habits, +hematochezia  Genitourinary: negative for frequency, urgency, dysuria, hematuria, incontinence  Muskuloskeletal : negative for arthralgia, myalgia  Hematology: negative for easy bruising, bleeding, lymphadenopathy  Dermatological: negative for rash, ulceration, mole change, new lesion  Endocrine: negative for hot flashes or polydipsia  Neurological: negative for headache, dizziness, confusion, focal weakness,  paresthesia, memory loss, gait disturbance  Psychological: negative for anxiety, depression, agitation     Objective:   VITALS:    Visit Vitals  BP (!) 152/107   Pulse 92   Temp 98.1 ??F (36.7 ??C)   Resp 23   Ht '6\' 1"'  (1.854 m)   Wt 110.5 kg (243 lb 9.7 oz)   SpO2 100%   BMI 32.14 kg/m??     PHYSICAL EXAM:     GENERAL:    WD y   WN y   Orthoptist    Thin n   Obese y   Disheveled y   Ill Appearing Critically n   Ill Appearing Chronically y   Acute Distress y   Other      HEENT:    NC/AT/EOMI y   PERRLA y   Conjunctivae Pink    Conjunctivae Pale y   Moist Mucosa    Dry Mucosa y   Hearing intact to voice y   Other      NECK:    Supple y   Masses n   Thyroid Tender n   Other n                  RESPIRATORY:    CTA bilaterally WITHOUT wheezing/rhonchi/rales or crackles    Wheezing n   Rhonchi n   Crackles y bibasilar   Use of accessory muscles  n   Other      CARDIAC:    regular rate and rhythm No murmurs/rubs/gallops    Murmur 2/6   Rubs n   Gallops n   Rate Regular/Irregular reg   Carotid Bruit Left/Right n   Lower Extremity Edema 2+   JVP  n   Other Normal capillary refill     ABDOMEN:    Soft non distended non tender +bowel sounds no HSM    Rigid n   Tenderness n   Hepatomegaly n   Splenomegaly n   Distended n   Increased girth due to habitus y   Normal/Hyper/Hypo Active Bowel Sounds nml   Other      SKIN / MUSCULOSKELETAL:    Rashes n   Ecchymosis n   Ulcers    Tight to palpitation    Turgor Good/Poor poor   Cyanosis/Clubbing n   Amputation(s) n   Other  NEUROLOGY:    cranial nerves II-XII grossly intact y   Cranial Nerve Deficit    Facial Droop n   Slurred Speech n   Aphasia    Strength Normal y   Weakness n   Meningismus/Kernig's Sign/ Brudzinsky n   Follows Commands y   Other      PSYCHIATRIC:    AAOx3 in no acute distress y   Insight Poor y   Insight Good    Alert and Oriented to Person     Alert and Oriented to Place    Alert and Oriented toTime    Depressed n   Anxious n   Agitated n   Lethargic     Stuporous    Sedated    Other    _______________________________________________________________________  Care Plan discussed with:    Comments   Patient x Discussed with patient in room. POC outlined and Questions answered (25   Family      RN x    Care Manager                    Consultant:  x ED MD 5, Renal Dr Loel Dubonnet 10   _______________________________________________________________________  Recommended Disposition:   Home with Family y   HH/PT/OT/RN    SNF/LTC    [facility]    ________________________________________________________________________  TOTAL TIME:  74 Minutes    Critical Care Provided     Minutes non procedure based      Comments   >50% of visit spent in counseling and coordination of care x Chart review  Discussion with patient and/or family and questions answered     ________________________________________________________________________  Signed: Modesto Charon, MD    This note will not be viewable in [place].      Procedures: see electronic medical records for all procedures/Xrays and details which were not copied into this note but were reviewed prior to creation of Plan.    LAB DATA REVIEWED:    Recent Results (from the past 24 hour(s))   EKG, 12 LEAD, INITIAL    Collection Time: 02/04/17  3:25 PM   Result Value Ref Range    Ventricular Rate 99 BPM    Atrial Rate 99 BPM    P-R Interval 188 ms    QRS Duration 84 ms    Q-T Interval 380 ms    QTC Calculation (Bezet) 487 ms    Calculated P Axis 63 degrees    Calculated R Axis -23 degrees    Calculated T Axis 86 degrees    Diagnosis       Normal sinus rhythm  Possible Left atrial enlargement  Prolonged QT  No previous ECGs available     CBC WITH AUTOMATED DIFF    Collection Time: 02/04/17  6:00 PM   Result Value Ref Range    WBC 6.4 4.1 - 11.1 K/uL    RBC 2.67 (L) 4.10 - 5.70 M/uL    HGB 8.0 (L) 12.1 - 17.0 g/dL    HCT 25.5 (L) 36.6 - 50.3 %    MCV 95.5 80.0 - 99.0 FL    MCH 30.0 26.0 - 34.0 PG    MCHC 31.4 30.0 - 36.5 g/dL     RDW 17.0 (H) 11.5 - 14.5 %    PLATELET 140 (L) 150 - 400 K/uL    MPV 9.5 8.9 - 12.9 FL    NRBC 0.0 0 PER 100 WBC    ABSOLUTE NRBC 0.00 0.00 - 0.01 K/uL  NEUTROPHILS 80 (H) 32 - 75 %    LYMPHOCYTES 6 (L) 12 - 49 %    MONOCYTES 7 5 - 13 %    EOSINOPHILS 5 0 - 7 %    BASOPHILS 1 0 - 1 %    IMMATURE GRANULOCYTES 1 (H) 0.0 - 0.5 %    ABS. NEUTROPHILS 5.1 1.8 - 8.0 K/UL    ABS. LYMPHOCYTES 0.4 (L) 0.8 - 3.5 K/UL    ABS. MONOCYTES 0.4 0.0 - 1.0 K/UL    ABS. EOSINOPHILS 0.3 0.0 - 0.4 K/UL    ABS. BASOPHILS 0.1 0.0 - 0.1 K/UL    ABS. IMM. GRANS. 0.1 (H) 0.00 - 0.04 K/UL    DF AUTOMATED      RBC COMMENTS ANISOCYTOSIS  1+       METABOLIC PANEL, COMPREHENSIVE    Collection Time: 02/04/17  6:00 PM   Result Value Ref Range    Sodium 138 136 - 145 mmol/L    Potassium 5.8 (H) 3.5 - 5.1 mmol/L    Chloride 105 97 - 108 mmol/L    CO2 23 21 - 32 mmol/L    Anion gap 10 5 - 15 mmol/L    Glucose 84 65 - 100 mg/dL    BUN 71 (H) 6 - 20 MG/DL    Creatinine 12.70 (H) 0.70 - 1.30 MG/DL    BUN/Creatinine ratio 6 (L) 12 - 20      GFR est AA 5 (L) >60 ml/min/1.72m    GFR est non-AA 4 (L) >60 ml/min/1.766m   Calcium 5.9 (LL) 8.5 - 10.1 MG/DL    Bilirubin, total 0.3 0.2 - 1.0 MG/DL    ALT (SGPT) 17 12 - 78 U/L    AST (SGOT) 25 15 - 37 U/L    Alk. phosphatase 254 (H) 45 - 117 U/L    Protein, total 6.9 6.4 - 8.2 g/dL    Albumin 2.9 (L) 3.5 - 5.0 g/dL    Globulin 4.0 2.0 - 4.0 g/dL    A-G Ratio 0.7 (L) 1.1 - 2.2     CK W/ REFLX CKMB    Collection Time: 02/04/17  6:00 PM   Result Value Ref Range    CK 1,497 (H) 39 - 308 U/L   TROPONIN I    Collection Time: 02/04/17  6:00 PM   Result Value Ref Range    Troponin-I, Qt. 0.08 (H) <0.05 ng/mL   LIPASE    Collection Time: 02/04/17  6:00 PM   Result Value Ref Range    Lipase 217 73 - 393 U/L   CK-MB,QUANT.    Collection Time: 02/04/17  6:00 PM   Result Value Ref Range    CK - MB 31.5 (H) <3.6 NG/ML    CK-MB Index 2.1 0 - 2.5

## 2017-02-04 NOTE — ED Notes (Signed)
TRANSFER - OUT REPORT:    [facility] on David [name]  being transferred to [facility](unit) for routine progression of care       Report consisted of patient???s Situation, Background, Assessment and   Recommendations(SBAR).     Information from the following report(s) SBAR, Kardex, ED Summary, [facility] and Recent Results was reviewed with the receiving nurse.    Lines:   Peripheral IV 02/04/17 Right Forearm (Active)   Site Assessment Clean, dry, & intact 02/04/2017  6:00 PM   Phlebitis Assessment 0 02/04/2017  6:00 PM   Dressing Status Clean, dry, & intact 02/04/2017  6:00 PM        Opportunity for questions and clarification was provided.

## 2017-02-04 NOTE — ED Notes (Addendum)
Assumed care of patient from Maceo ProLauren B, RN. Patient resting comfortably on stretcher with call bell within reach. Rates pain 10/10. Patient's stretcher in lowest position, with side rails up x1. Patient on the monitor x3. Patient updated on plan of care at this time. Patient's belongings are in close proximity. Patient assessed for all personal needs that may be completed by RN. No further complaints noted at this time.    Patient complaining of CP, abdominal pain,  n/v/d. Stopped taking his C. Diff x2 weeks ago after completion of treatment. Abdomianl Pain started Sunday and is constant. He states the pain wraps around to his back with lower back pain. Chest pain started this morning and is accompanied with swelling to lower extremities.     MD Katrinka BlazingSmith at bedside to evaluate patient

## 2017-02-04 NOTE — ED Notes (Signed)
Patient states he has not had dialysis since Monday (Usually goes MWF) because of diarrhea.

## 2017-02-04 NOTE — ED Notes (Signed)
Pt refusing tylenol at this time for pain, pt aware that he will not receive narcotics for pain.

## 2017-02-05 LAB — EKG, 12 LEAD, INITIAL
Atrial Rate: 99 {beats}/min
Calculated P Axis: 63 degrees
Calculated R Axis: -23 degrees
Calculated T Axis: 86 degrees
Diagnosis: NORMAL
P-R Interval: 188 ms
Q-T Interval: 380 ms
QRS Duration: 84 ms
QTC Calculation (Bezet): 487 ms
Ventricular Rate: 99 {beats}/min

## 2017-02-05 LAB — OCCULT BLOOD, STOOL: Occult blood, stool: NEGATIVE

## 2017-02-05 LAB — EKG 12-LEAD
Atrial Rate: 99 {beats}/min
Diagnosis: NORMAL
P Axis: 63 degrees
P-R Interval: 188 ms
Q-T Interval: 380 ms
QRS Duration: 84 ms
QTc Calculation (Bazett): 487 ms
R Axis: -23 degrees
T Axis: 86 degrees
Ventricular Rate: 99 {beats}/min

## 2017-02-05 MED ORDER — AMLODIPINE 5 MG TAB
5 mg | Freq: Every day | ORAL | Status: DC
Start: 2017-02-05 — End: 2017-02-06

## 2017-02-05 MED ORDER — EPOETIN ALFA 10,000 UNIT/ML IJ SOLN
10000 unit/mL | INTRAMUSCULAR | Status: DC
Start: 2017-02-05 — End: 2017-02-06

## 2017-02-05 MED ORDER — VANCOMYCIN ORAL SOLUTION 50 MG/ML CPD (RX COMPOUNDED)
50 mg/mL | Freq: Four times a day (QID) | ORAL | Status: DC
Start: 2017-02-05 — End: 2017-02-06
  Administered 2017-02-05 (×3): via ORAL

## 2017-02-05 MED ORDER — CARVEDILOL 12.5 MG TAB
12.5 mg | Freq: Two times a day (BID) | ORAL | Status: DC
Start: 2017-02-05 — End: 2017-02-06

## 2017-02-05 MED ORDER — IBUPROFEN 100 MG/5 ML ORAL SUSP
100 mg/5 mL | Freq: Four times a day (QID) | ORAL | Status: DC | PRN
Start: 2017-02-05 — End: 2017-02-06

## 2017-02-05 MED ORDER — ALBUTEROL SULFATE HFA 90 MCG/ACTUATION AEROSOL INHALER
90 mcg/actuation | Freq: Four times a day (QID) | RESPIRATORY_TRACT | Status: DC | PRN
Start: 2017-02-05 — End: 2017-02-06
  Administered 2017-02-05: 09:00:00 via RESPIRATORY_TRACT

## 2017-02-05 MED ORDER — SODIUM CHLORIDE 0.9 % IJ SYRG
INTRAMUSCULAR | Status: DC | PRN
Start: 2017-02-05 — End: 2017-02-06

## 2017-02-05 MED ORDER — LABETALOL 5 MG/ML IV SOLN
5 mg/mL | INTRAVENOUS | Status: DC | PRN
Start: 2017-02-05 — End: 2017-02-06
  Administered 2017-02-05: 04:00:00 via INTRAVENOUS

## 2017-02-05 MED ORDER — ONDANSETRON (PF) 4 MG/2 ML INJECTION
4 mg/2 mL | Freq: Four times a day (QID) | INTRAMUSCULAR | Status: DC | PRN
Start: 2017-02-05 — End: 2017-02-06

## 2017-02-05 MED ORDER — IPRATROPIUM-ALBUTEROL 2.5 MG-0.5 MG/3 ML NEB SOLUTION
2.5 mg-0.5 mg/3 ml | RESPIRATORY_TRACT | Status: AC
Start: 2017-02-05 — End: 2017-02-04
  Administered 2017-02-05: 02:00:00 via RESPIRATORY_TRACT

## 2017-02-05 MED ORDER — ACETAMINOPHEN 325 MG TABLET
325 mg | ORAL | Status: DC | PRN
Start: 2017-02-05 — End: 2017-02-06

## 2017-02-05 MED ORDER — SEVELAMER CARBONATE 800 MG TAB
800 mg | Freq: Three times a day (TID) | ORAL | Status: DC
Start: 2017-02-05 — End: 2017-02-06
  Administered 2017-02-05: 22:00:00 via ORAL

## 2017-02-05 MED ORDER — HYDRALAZINE 50 MG TAB
50 mg | Freq: Three times a day (TID) | ORAL | Status: DC
Start: 2017-02-05 — End: 2017-02-06

## 2017-02-05 MED ORDER — SODIUM CHLORIDE 0.9 % IJ SYRG
Freq: Three times a day (TID) | INTRAMUSCULAR | Status: DC
Start: 2017-02-05 — End: 2017-02-06
  Administered 2017-02-05 (×3): via INTRAVENOUS

## 2017-02-05 MED FILL — VENTOLIN HFA 90 MCG/ACTUATION AEROSOL INHALER: 90 mcg/actuation | RESPIRATORY_TRACT | Qty: 8

## 2017-02-05 MED FILL — LABETALOL 5 MG/ML IV SOLN: 5 mg/mL | INTRAVENOUS | Qty: 20

## 2017-02-05 MED FILL — BD POSIFLUSH NORMAL SALINE 0.9 % INJECTION SYRINGE: INTRAMUSCULAR | Qty: 10

## 2017-02-05 MED FILL — HYDRALAZINE 50 MG TAB: 50 mg | ORAL | Qty: 2

## 2017-02-05 MED FILL — SEVELAMER CARBONATE 800 MG TAB: 800 mg | ORAL | Qty: 1

## 2017-02-05 MED FILL — CARVEDILOL 12.5 MG TAB: 12.5 mg | ORAL | Qty: 1

## 2017-02-05 MED FILL — VANCOMYCIN ORAL SOLUTION 50 MG/ML CPD (RX COMPOUNDED): 50 mg/mL | ORAL | Qty: 5

## 2017-02-05 MED FILL — PROCRIT 10,000 UNIT/ML INJECTION SOLUTION: 10000 unit/mL | INTRAMUSCULAR | Qty: 1

## 2017-02-05 MED FILL — IPRATROPIUM-ALBUTEROL 2.5 MG-0.5 MG/3 ML NEB SOLUTION: 2.5 mg-0.5 mg/3 ml | RESPIRATORY_TRACT | Qty: 3

## 2017-02-05 MED FILL — ONDANSETRON (PF) 4 MG/2 ML INJECTION: 4 mg/2 mL | INTRAMUSCULAR | Qty: 2

## 2017-02-05 MED FILL — AMLODIPINE 5 MG TAB: 5 mg | ORAL | Qty: 2

## 2017-02-05 MED FILL — CHILDREN'S IBUPROFEN 100 MG/5 ML ORAL SUSPENSION: 100 mg/5 mL | ORAL | Qty: 20

## 2017-02-05 NOTE — Consults (Addendum)
Cardiology Consult Note    CC: dyspnea    ZOX:WRUEA, Phys, MD  Reason for consult: CP  Requesting MD:  Dr. Kaleen Odea  Admit Date: 02/04/2017   Today's Date: 02/05/2017    Cardiac Assessment/Plan:   CP: Very long h/o intermittent non-cardiac CP; Worse when non-compliant with HD.  CHRONIC INDETERMINANT TROPONIN; Not have ACS/CAD Sx; No further cardiac w/u indicated.    HTN: should continue to improve with vol control/HD compliance.    Rhythm: sinus    ESRD, Dispo: per primary team.     No active cardiac issues; will sign-off; Please call if questions/issues.  F/U with PCP/renal after d/c. Thanks.    Hospital Problem List:  Active Problems:    Accelerated hypertension (02/04/2017)       ____________________________________________________________________  David [name] is a 46 y.o. male presents with Accelerated hypertension.    As noted in H&P: "46 y.o.  African American male presents to ED with complaint noted above. Available records were reviewed at the time of H&P. Patient gave his name and DOB not as he has in the past - he is to be under David [name] DOB 2071-02-17 MRN: [id number] - registration is looking to correct.   ??  Patient with PMH for end-stage renal disease on hemodialysis Mondays, Wednesdays, Fridays, ascites, asthma, obesity, chronic hypoxic respiratory failure, hypertension, myocardial infarction, coronary artery disease, obstructive sleep apnea, C. diff colitis, spontaneous bacterial peritonitis just admitted and left AMA from [facility]. He has complex hx for the recurrence of cdiff failed therapy and was for stool transplant but left AMA. He now returns with x4 days of intermittent chest pressure/heaviness, missed HD, SOB and abdominal pain with intermittent diarrhea that occasionally is bloody in color. The chest heaviness "feels just like the time in 11/2016". He was at [facility] with similar presentation of abdominal pain, diarrhea - bloody, and chest pain seen by Cardiology Dr David [name].  Found no evidence to support ACS. Further only found ascites. He was dialyzed. During work up he opted to leave AMA and therefore further work up was uanble to be completed. Patient did not go to HD over this week therefore he has missed >2 sessions. He feels like his weight is stable. Poor PO intake and n/v with diarrhea that occasionally is bloody in character, noncramping. No fevers. No sick contacts. In ED noted to have mild increase in potassium to 5.8. No peaked T waves on ecg. Calcium found low at 5.9 corrects to 6.5 with albumin factored. No wbc. hgb low at 8. Vss. Trop 0.08 seems in line with his prior values from august.   No complaints of dizziness, lightheadedness, focal weakness, new onset of numbness, paresthesias, facial droop, headache, visual disturbance, neck pain, current shortness of breath, palpitations, calf pain and swelling, edema, rash, head or neck trauma or falls.   We were asked to see for work up and evaluation"    Long h/o non-compliance; reported drug seeking behavior.  ______________________________________________________________________    The patient reports "new chest pain" for last week or so; lasts hours at a time; left pressure sensation but also worse with palpation.  He intimally stated he hadn't had previous CP: when reminded about admission in 04/2013, 02/2016, 05/2016, and 11/2016 (other unknown), he indicated those admits were mainly for dyspnea (records reviewed indicate otherwise).    No PND, orthopnea, palpitations, pre-syncope, syncope, peripheral edema, or decrease in exercise tolerance.  No current complaints.    ECG: sinus; normal.  Tele: sinus  CXR: "  Cardiomegaly with mild pulmonary vascular congestion and trace pleural Effusions."  Abd/pelvis CT: "1. No apparent inflammation.   2. Ascites, pleural effusions and third spacing body wall edema. "    Notable labs: Cr 12; K 5.8; CK 1.4k; trop 0.08. Hg 8.    Notable prior cardiac history:   LONG H/O NON-CARDIAC CP/chronic  indeterminate troponin.    Normal cath 04/28/13 for CP/elevated troponin @ [facility]. Neg MPI for ischemia 02/2016 @ [place]; Declined stress test @ [facility] 11/2016.  Echo 10/2016 @ [place]: Ef 50%; Echo 04/2013 @ [facility]: EF 50-55%; mild MR.    [facility] non-[facility]/CP/dyspnea.      ______________________________________________________________________  Patient Active Problem List    Diagnosis Date Noted   ??? Accelerated hypertension 02/04/2017        Past Medical History:   Diagnosis Date   ??? Asthma    ??? Chronic kidney disease    ??? Hypertension    ??? Ill-defined condition     Ascites      Past Surgical History:   Procedure Laterality Date   ??? HX GI      Groin & Hernia repair   ??? HX VASCULAR ACCESS      PD Catheter placed and removed     Allergies   Allergen Reactions   ??? Lisinopril Swelling   ??? Morphine Swelling   ??? Toradol [Ketorolac] Swelling      Family History   Problem Relation Age of Onset   ??? Hypertension Other       Social History     Socioeconomic History   ??? Marital status: DIVORCED     Spouse name: Not on file   ??? Number of children: Not on file   ??? Years of education: Not on file   ??? Highest education level: Not on file   Social Needs   ??? Financial resource strain: Not on file   ??? Food insecurity - worry: Not on file   ??? Food insecurity - inability: Not on file   ??? Transportation needs - medical: Not on file   ??? Transportation needs - non-medical: Not on file   Occupational History   ??? Not on file   Tobacco Use   ??? Smoking status: Current Every Day Smoker     Packs/day: 0.25   ??? Tobacco comment: 3 cigs. daily   Substance and Sexual Activity   ??? Alcohol use: No     Frequency: Never   ??? Drug use: No   ??? Sexual activity: Not on file   Other Topics Concern   ??? Not on file   Social History Narrative   ??? Not on file     Current Facility-Administered Medications   Medication Dose Route Frequency   ??? ondansetron (ZOFRAN) injection 4 mg  4 mg IntraVENous Q6H PRN    ??? albuterol (PROVENTIL HFA, VENTOLIN HFA, PROAIR HFA) inhaler 2 Puff  2 Puff Inhalation Q6H PRN   ??? ibuprofen (ADVIL;MOTRIN) 100 mg/5 mL oral suspension 400 mg  400 mg Oral Q6H PRN   ??? epoetin alfa (EPOGEN;PROCRIT) injection 10,000 Units  10,000 Units SubCUTAneous Once per day on Thu Sat   ??? sodium chloride (NS) flush 5-10 mL  5-10 mL IntraVENous Q8H   ??? sodium chloride (NS) flush 5-10 mL  5-10 mL IntraVENous PRN   ??? carvedilol (COREG) tablet 12.5 mg  12.5 mg Oral BID WITH MEALS   ??? hydrALAZINE (APRESOLINE) tablet 100 mg  100  mg Oral Q8H   ??? sevelamer carbonate (RENVELA) tab 800 mg  800 mg Oral TID WITH MEALS   ??? amLODIPine (NORVASC) tablet 10 mg  10 mg Oral DAILY   ??? acetaminophen (TYLENOL) tablet 650 mg  650 mg Oral Q4H PRN   ??? labetalol (NORMODYNE;TRANDATE) injection 10 mg  10 mg IntraVENous Q2H PRN   ??? sodium chloride (NS) flush 5-10 mL  5-10 mL IntraVENous Q8H   ??? sodium chloride (NS) flush 5-10 mL  5-10 mL IntraVENous PRN   ??? vancomycin 50 mg/mL oral solution (compounded) 125 mg  125 mg Oral Q6H        Prior to Admission Medications:  Prior to Admission medications    Medication Sig Start Date End Date Taking? Authorizing Provider   hydrALAZINE (APRESOLINE) 100 mg tablet Take  by mouth three (3) times daily.   Yes Other, Phys, MD   amLODIPine (NORVASC) 10 mg tablet Take  by mouth daily.   Yes Other, Phys, MD   carvedilol (COREG) 12.5 mg tablet Take 12.5 mg by mouth two (2) times daily (with meals).   Yes Other, Phys, MD   sevelamer (RENAGEL) 800 mg tablet Take  by mouth three (3) times daily (with meals).   Yes Other, Phys, MD   albuterol (PROVENTIL HFA, VENTOLIN HFA, PROAIR HFA) 90 mcg/actuation inhaler Take  by inhalation.   Yes Other, Phys, MD   furosemide (LASIX) 80 mg tablet Take 80 mg by mouth daily.   Yes Other, Phys, MD        Review of Symptoms: As noted in H&P:  "REVIEW OF SYSTEMS:  See HPI for details  General: negative for fever, chills, sweats, weakness, weight loss   Eyes: negative for blurred vision, eye pain, loss of vision, diplopia  Ear Nose and Throat: negative for rhinorrhea, pharyngitis, otalgia, tinnitus, speech or swallowing difficulties  Respiratory:  negative for pleuritic pain, cough, sputum production, wheezing, ++SOB, DOE  Cardiology:  +chronic chest pain, no palpitations, orthopnea, PND, +LE edema, no syncope   Gastrointestinal: +abdominal pain, +N/V, no dysphagia, change in bowel habits, +hematochezia  Genitourinary: negative for frequency, urgency, dysuria, hematuria, incontinence  Muskuloskeletal : negative for arthralgia, myalgia  Hematology: negative for easy bruising, bleeding, lymphadenopathy  Dermatological: negative for rash, ulceration, mole change, new lesion  Endocrine: negative for hot flashes or polydipsia  Neurological: negative for headache, dizziness, confusion, focal weakness, paresthesia, memory loss, gait disturbance  Psychological: negative for anxiety, depression, agitation ".     24 hr VS reviewed, overall VSSAF  Temp (24hrs), Avg:98.2 ??F (36.8 ??C), Min:97.6 ??F (36.4 ??C), Max:98.6 ??F (37 ??C)    Patient Vitals for the past 8 hrs:   Pulse   02/05/17 1133 95   02/05/17 0704 92    Patient Vitals for the past 8 hrs:   Resp   02/05/17 1133 18   02/05/17 0704 18    Patient Vitals for the past 8 hrs:   BP   02/05/17 1133 (!) 155/104   02/05/17 0704 (!) 170/100        No intake or output data in the 24 hours ending 02/05/17 1402      Physical Exam (complete single organ system exam)    Cons: The patient is no distress. Appears stated age.   HEENT: Normal conjunctivae and palate. No xanthelasma.  Neck: Flat JVP without appreciable HJR.  Resp: Normal respiratory effort with clear lungs bilaterally.   CV: Regular rate and rhythm.   PMI not palpated.  Normal S1,S2  No gallop or rubs appreciated.  No murmur appreciated.  Intact carotid upstroke bilaterally without appreciated bruits.  Abdominal aorta not palpated; no abdominal bruit noted.   Present pedal pulses.   + peripheral edema/abd wall edema.  GI: No abd mass noted, soft; no organomegaly noted.  Bowel sounds present.   Muscular:  No significant kyphosis.  Strength WNL for age.Ext: No cyanosis, clubbing, or stigmata of peripheral embolization.   Derm: No ulcers or stasis dermatitis of lower extremities.   Neuro: Alert and oriented x 3;  Grossly non-focal. Normal mood and affect.     Labs:   Recent Results (from the past 24 hour(s))   EKG, 12 LEAD, INITIAL    Collection Time: 02/04/17  3:25 PM   Result Value Ref Range    Ventricular Rate 99 BPM    Atrial Rate 99 BPM    P-R Interval 188 ms    QRS Duration 84 ms    Q-T Interval 380 ms    QTC Calculation (Bezet) 487 ms    Calculated P Axis 63 degrees    Calculated R Axis -23 degrees    Calculated T Axis 86 degrees    Diagnosis       Normal sinus rhythm  Possible Left atrial enlargement  Prolonged QT  No previous ECGs available     CBC WITH AUTOMATED DIFF    Collection Time: 02/04/17  6:00 PM   Result Value Ref Range    WBC 6.4 4.1 - 11.1 K/uL    RBC 2.67 (L) 4.10 - 5.70 M/uL    HGB 8.0 (L) 12.1 - 17.0 g/dL    HCT 25.5 (L) 36.6 - 50.3 %    MCV 95.5 80.0 - 99.0 FL    MCH 30.0 26.0 - 34.0 PG    MCHC 31.4 30.0 - 36.5 g/dL    RDW 17.0 (H) 11.5 - 14.5 %    PLATELET 140 (L) 150 - 400 K/uL    MPV 9.5 8.9 - 12.9 FL    NRBC 0.0 0 PER 100 WBC    ABSOLUTE NRBC 0.00 0.00 - 0.01 K/uL    NEUTROPHILS 80 (H) 32 - 75 %    LYMPHOCYTES 6 (L) 12 - 49 %    MONOCYTES 7 5 - 13 %    EOSINOPHILS 5 0 - 7 %    BASOPHILS 1 0 - 1 %    IMMATURE GRANULOCYTES 1 (H) 0.0 - 0.5 %    ABS. NEUTROPHILS 5.1 1.8 - 8.0 K/UL    ABS. LYMPHOCYTES 0.4 (L) 0.8 - 3.5 K/UL    ABS. MONOCYTES 0.4 0.0 - 1.0 K/UL    ABS. EOSINOPHILS 0.3 0.0 - 0.4 K/UL    ABS. BASOPHILS 0.1 0.0 - 0.1 K/UL    ABS. IMM. GRANS. 0.1 (H) 0.00 - 0.04 K/UL    DF AUTOMATED      RBC COMMENTS ANISOCYTOSIS  1+       METABOLIC PANEL, COMPREHENSIVE    Collection Time: 02/04/17  6:00 PM   Result Value Ref Range     Sodium 138 136 - 145 mmol/L    Potassium 5.8 (H) 3.5 - 5.1 mmol/L    Chloride 105 97 - 108 mmol/L    CO2 23 21 - 32 mmol/L    Anion gap 10 5 - 15 mmol/L    Glucose 84 65 - 100 mg/dL    BUN 71 (H) 6 - 20 MG/DL    Creatinine 12.70 (H) 0.70 -  1.30 MG/DL    BUN/Creatinine ratio 6 (L) 12 - 20      GFR est AA 5 (L) >60 ml/min/1.25m    GFR est non-AA 4 (L) >60 ml/min/1.711m   Calcium 5.9 (LL) 8.5 - 10.1 MG/DL    Bilirubin, total 0.3 0.2 - 1.0 MG/DL    ALT (SGPT) 17 12 - 78 U/L    AST (SGOT) 25 15 - 37 U/L    Alk. phosphatase 254 (H) 45 - 117 U/L    Protein, total 6.9 6.4 - 8.2 g/dL    Albumin 2.9 (L) 3.5 - 5.0 g/dL    Globulin 4.0 2.0 - 4.0 g/dL    A-G Ratio 0.7 (L) 1.1 - 2.2     CK W/ REFLX CKMB    Collection Time: 02/04/17  6:00 PM   Result Value Ref Range    CK 1,497 (H) 39 - 308 U/L   TROPONIN I    Collection Time: 02/04/17  6:00 PM   Result Value Ref Range    Troponin-I, Qt. 0.08 (H) <0.05 ng/mL   LIPASE    Collection Time: 02/04/17  6:00 PM   Result Value Ref Range    Lipase 217 73 - 393 U/L   CK-MB,QUANT.    Collection Time: 02/04/17  6:00 PM   Result Value Ref Range    CK - MB 31.5 (H) <3.6 NG/ML    CK-MB Index 2.1 0 - 2.5     EKG, 12 LEAD, INITIAL    Collection Time: 02/05/17 11:41 AM   Result Value Ref Range    Ventricular Rate 88 BPM    Atrial Rate 88 BPM    P-R Interval 202 ms    QRS Duration 88 ms    Q-T Interval 404 ms    QTC Calculation (Bezet) 488 ms    Calculated P Axis 55 degrees    Calculated R Axis -26 degrees    Calculated T Axis 67 degrees    Diagnosis       Normal sinus rhythm  Prolonged QT  When compared with ECG of 04-Feb-2017 15:25,  MANUAL COMPARISON REQUIRED, DATA IS UNCONFIRMED       Recent Labs     02/04/17  1800   CKMB 31.5*   CKNDX 2.1   TROIQ 0.08*       Recent Labs     02/04/17  1800   NA 138   K 5.8*   CL 105   CO2 23   BUN 71*   CREA 12.70*   GFRAA 5*   GLU 84   CA 5.9*   ALB 2.9*   WBC 6.4   HGB 8.0*   HCT 25.5*   PLT 140*       DeAugust SaucerMD

## 2017-02-05 NOTE — Progress Notes (Addendum)
Bedside and Verbal shift change report given to David [name], David fundraiserN (oncoming nurse). Report included the following information SBAR, Kardex, ED Summary, Procedure Summary, Intake/Output, MAR, Recent Results, Med Rec Status and Cardiac Rhythm (NSR).     SHIFT SUMMARY:    0000- pt refused PO medications, c/o 10/10 pain, pt refused Tylenol, paged on call hospitalist to address pain  0100- pt c/o N/V, diarrhea  0130- after 4 pages to on call hospitalist, paged Dr. Elease [name] and received orders for N/V  0320- Dr. Nedra [name] gave orders to address pt's pain and SOB  0500- pt requested something else for pain, paged hospitalist and Dr. Nedra [name] refused to prescribe narcotics  0600- pt refused skin assessment "my skin is fine" and am labs, pt refused PO medications      [facility] NURSING NOTE   Admission Date 02/04/2017   Admission Diagnosis Accelerated hypertension   Consults IP CONSULT TO CARDIOLOGY  IP CONSULT TO GASTROENTEROLOGY      Cardiac Monitoring [x]  Yes []  No      Purposeful Hourly Rounding [x]  Yes    Schmid Score Total Score: 1   Schmid score 3 or > []  Bed Alarm []  Avasys []  1:1 sitter []  Patient refused (Signed refusal form in chart)   Braden Score Braden Score: 20   Braden score 14 or < []  PMT consult []  Wound Care consult    []   Specialty bed  []  Nutrition consult      Influenza Vaccine Received Flu Vaccine for Current Season (usually Sept-March): Yes           Oxygen needs? []  Room air Oxygen @  [] 1L    [] 2L    [] 3L   [x] 4L    [] 5L   [] 6L via  NC   Chronic home O2 use? [x]  Yes []  No  Perform O2 challenge test and document in progress note using smartphrase (.Homeoxygen)      Last bowel movement Last Bowel Movement Date: 02/05/17(per pt)      Urinary Catheter             LDAs               Peripheral IV 02/04/17 Right Forearm (Active)   Site Assessment Clean, dry, & intact 02/05/2017  5:32 AM   Phlebitis Assessment 0 02/05/2017  5:32 AM   Infiltration Assessment 0 02/05/2017  5:32 AM    Dressing Status Clean, dry, & intact 02/05/2017  5:32 AM   Dressing Type Transparent 02/05/2017  5:32 AM   Hub Color/Line Status Pink;Flushed 02/05/2017  5:32 AM                         Readmission Risk Assessment Tool Score Low Risk            5       Total Score        5 Pt. Coverage (Medicare=5 , Medicaid, or Self-Pay=4)        Criteria that do not apply:    Has Seen PCP in Last 6 Months (Yes=3, No=0)    Married. Living with Significant Other. Assisted Living. LTAC. SNF. or   Rehab    Patient Length of Stay (>5 days = 3)    IP Visits Last 12 Months (1-3=4, 4=9, >4=11)    Charlson Comorbidity Score (Age + Comorbid Conditions)       Expected Length of Stay - - -   Actual Length of Stay 1

## 2017-02-05 NOTE — Consults (Addendum)
[facility] Consult Note        NAME: David [name]       DOB:  1970/12/25       MRN:  [id number]     Date/Time: 02/05/2017    Risk of deterioration: medium       Assessment:    Plan:  ESRD-without a unit --  Non compliance  Cdiff  HTN  PC  CAD with hx of stent  HX of drug seeking behavior HD today-2 hours  2 k bath--he is on schedule-[place] has to travel to him b/c of the "C diff"-having an abbreviated treatment today due to the high number of IP needing HD today. (Pt typically does not dialyze a full HD treatment so I will feel this will be a success IF he does a 2 hour treatment)  Pt does not have a hd unit as he was fired from [employer] [place] atleast a year ago.  He has had [name] admissions at this hospital, [facility] and [place] for the above-  He frequents the ER, has a hx of leaving AMA.  Has been evaluated for a stool tx in the past but didn't stay to get this done (I believe that was at [facility])  Pt can be abusive to staff/nursing.  Of note-pt signed in the ED last night going by his middle name and claiming he "wasn't David [name] for further information please go under his other CC chart-he was last at [facility] in august where he had cp/abd pain-was seen by dr. Currie [name] at that time by [facility].  Next HD Monday-will see again then.  Call if problems arise tomorrow.   Asked to see for HD needs-Of note, pt admitted under his middle name--told ER [name] he was NOT David [name] and didn't have ID.  Admitted to Dr. Kaleen Odea he was David [name].  Please see all the admissions and extensive work ups/evaluations this pt has had over the last 1.5 years-    Subjective:     Chief Complaint:  "I need my dialysis"    Review of Systems: (+) abd pain, cp, fever  (+) diarrhea -subjective-goes from ER to Er for dialysis (+) hx of drug seeking/manipulative behavior    Objective:     VITALS:   Last 24hrs VS reviewed since prior progress note. Most recent are:  Visit Vitals   BP (!) 155/104 (BP 1 Location: Right arm, BP Patient Position: Sitting)   Pulse 95   Temp 98.1 ??F (36.7 ??C)   Resp 18   Ht '6\' 1"'  (1.854 m)   Wt 111.5 kg (245 lb 13 oz)   SpO2 100%   BMI 32.43 kg/m??     SpO2 Readings from Last 6 Encounters:   02/05/17 100%    O2 Flow Rate (L/min): 4 l/min   No intake or output data in the 24 hours ending 02/05/17 1139     Telemetry Reviewed     PHYSICAL EXAM:    General   well developed, well nourished, appears stated age, in no acute distress  EENT  NCAT, EOMI  Respiratory   Decreased bs in bases  Cardiology  RRR, systolic murmur, (+) PC  Extremities  (+) edema              Lab Data Reviewed: (see below)    Medications Reviewed: (see below)    PMH/SH reviewed - no change compared to H&P  ________________________________________  ___________________________________________________    Attending Physician: David Roers, MD  ____________________________________________________  MEDICATIONS:  Current Facility-Administered Medications   Medication Dose Route Frequency   ??? ondansetron (ZOFRAN) injection 4 mg  4 mg IntraVENous Q6H PRN   ??? albuterol (PROVENTIL HFA, VENTOLIN HFA, PROAIR HFA) inhaler 2 Puff  2 Puff Inhalation Q6H PRN   ??? ibuprofen (ADVIL;MOTRIN) 100 mg/5 mL oral suspension 400 mg  400 mg Oral Q6H PRN   ??? sodium chloride (NS) flush 5-10 mL  5-10 mL IntraVENous Q8H   ??? sodium chloride (NS) flush 5-10 mL  5-10 mL IntraVENous PRN   ??? carvedilol (COREG) tablet 12.5 mg  12.5 mg Oral BID WITH MEALS   ??? hydrALAZINE (APRESOLINE) tablet 100 mg  100 mg Oral Q8H   ??? sevelamer carbonate (RENVELA) tab 800 mg  800 mg Oral TID WITH MEALS   ??? amLODIPine (NORVASC) tablet 10 mg  10 mg Oral DAILY   ??? acetaminophen (TYLENOL) tablet 650 mg  650 mg Oral Q4H PRN   ??? labetalol (NORMODYNE;TRANDATE) injection 10 mg  10 mg IntraVENous Q2H PRN   ??? sodium chloride (NS) flush 5-10 mL  5-10 mL IntraVENous Q8H   ??? sodium chloride (NS) flush 5-10 mL  5-10 mL IntraVENous PRN    ??? vancomycin 50 mg/mL oral solution (compounded) 125 mg  125 mg Oral Q6H        LABS:  Recent Labs     02/04/17  1800   WBC 6.4   HGB 8.0*   HCT 25.5*   PLT 140*     Recent Labs     02/04/17  1800   NA 138   K 5.8*   CL 105   CO2 23   BUN 71*   CREA 12.70*   GLU 84   CA 5.9*     Recent Labs     02/04/17  1800   SGOT 25   ALT 17   AP 254*   TBILI 0.3   TP 6.9   ALB 2.9*   GLOB 4.0   LPSE 217     No results for input(s): INR, PTP, APTT in the last 72 hours.    No lab exists for component: INREXT   No results for input(s): FE, TIBC, PSAT, FERR in the last 72 hours.   No results for input(s): PH, PCO2, PO2 in the last 72 hours.  Recent Labs     02/04/17  1800   CKNDX 2.1   TROIQ 0.08*     No results found for: GLUCPOC

## 2017-02-05 NOTE — Progress Notes (Addendum)
0700: [facility], off going nurse. Assumed care of patient. Night sift stated that pt refused morning labs. Will have them drawn with dialysis. Pt also took hat out of toilet to have a BM. It was reinforced with pt that we need to collect a stool sample with the hat. Pt verbalized understanding.     0800: BP 170/100, spoke w/ dialysis, said to give scheduled Norvasc and coreg. Pt refused despite education, said it would bottom out his pressure. Pt also refused PRN labetalol.

## 2017-02-05 NOTE — Progress Notes (Signed)
Called last night by ER for consultation. Per hospitalist note, seen by [facility] and they will contact [facility] if needed. Thank you.

## 2017-02-05 NOTE — Progress Notes (Signed)
Hospitalist Progress Note  NAME: Laqueta Lindenroy D Gagliano   DOB:  May 08, 1970   MRN:  [id number]       Assessment / Plan:  Accelerated hypertension on benign essential hypertension - improving  Hyperkalemia, acute   Acute renal failure on Chronic kidney disease on HD, non compliant with HD  Recurrent CDIFF  Chest pressure  Recurrent abdominal pain, generalized  -admit to tele  -use labetalol IV prn sbp >170  -resume home cardiac medications - he has not been compliant  -possible etiology of worsened ARF: volume losses due to n/v and diarrhea. Further missed HD x multiple.  -will require HD in AM given he has missed >2 HD sessions in a row  -currently without changes in ECG at this time c/w hyperkalemia - will monitor carefully - if changes then will need to have emergent HD  -had very [facility] under [facility] Oscar Laaplan for his chest pain presentation. Per Dr Oscar Laaplan note: "Chart reviewed. ??Multiple presentations for described symptom complex. ??Negative cardiac studies and no objective findings. ??Will SOR."   -will follow troponin and have VCS see patient in consultation for following, but at this time do not see need for further work up this evening with recent work up during last admission for active chest pain being unyielding. Of course given his renal disease, possible history for CAD and stenting (looking for reports, not finding at thsi time), he is at risk for acute coronary syndrome. Regardless, in the setting of possible GI bleed (guaiac pos) and anemia we will not order asa/plavix.   -lipid panel pending  -npo after mn  -cdiff is an issue - has had >5 rounds of vancomycin/fidaxomycin or flagyl and all have failed. He has been set up for fecal transplant x multiple but has left AMA prior to all. Will have GI see and evaluate  -strict hand washing  -prior work up for similar/same abdominal pain yielded CT abd showing ascites, but Consider for possible ascites as prior. He remains afebrile,  normal wbcThe patient is afebrile with normal white blood cell count and as such no convincing evidence for SBP (spontaneous bacterial peritonitis). Follow as above for now - repeat CT if post HD he is not improved.     10/20:  Pt is known to me from earlier in the year. Pt with long history of medication/HD noncompliance, as well as manipulative pain seeking behavior. Pt known to sign out AMA many times before.  Pt for HD today - appreciate Dr. Madalyn Robickman's help.  Pt's symptoms will likely subside after HD, as he has missed his previous sessions [last 3 is what he told me]. Informed by RN that pt was told that we needed stool sample for C. Diff testing and that he removed that mechanism from which we capture the stool. Advised pt that he needs to provide a stool sample or we cannot treat him appropriately - appreciate GI help.  No narcotics for pain. Will continue monitoring. Will repeat blood work. No IV abx for now. Can continue with oral Vanco for presumed C. Diff.  Pt with decision making capacity currently.    Obstructive sleep apnea  -not on CPAP    30.0 - 39.9 Obese / Body mass index is 32.43 kg/m??.    Code status: Full  Prophylaxis: Hep SQ  Recommended Disposition: TBD     Subjective:     Chief Complaint / Reason for Physician Visit  Complaining of abdominal pain.  Discussed with RN  events overnight.     Review of Systems:  Symptom Y/N Comments  Symptom Y/N Comments   Fever/Chills nn   Chest Pain n    Poor Appetite y   Edema     Cough    Abdominal Pain y    Sputum    Joint Pain     SOB/DOE y   Pruritis/Rash     Nausea/vomit    Tolerating PT/OT     Diarrhea y   Tolerating Diet     Constipation    Other       Could NOT obtain due to:      Objective:     VITALS:   Last 24hrs VS reviewed since prior progress note. Most recent are:  Patient Vitals for the past 24 hrs:   Temp Pulse Resp BP SpO2   02/05/17 1133 98.1 ??F (36.7 ??C) 95 18 (!) 155/104 100 %   02/05/17 0704 97.6 ??F (36.4 ??C) 92 18 (!) 170/100 99 %    02/05/17 0332 98.4 ??F (36.9 ??C) 90 20 (!) 158/104 100 %   02/04/17 2329 98.6 ??F (37 ??C) 92 18 (!) 185/124 100 %   02/04/17 2312 ??? 91 16 ??? 100 %   02/04/17 2241 ??? 92 15 ??? ???   02/04/17 2132 ??? 94 18 ??? ???   02/04/17 2032 98.2 ??F (36.8 ??C) 93 18 ??? 100 %   02/04/17 2028 98.2 ??F (36.8 ??C) 95 20 (!) 146/97 100 %   02/04/17 1926 ??? 92 23 ??? 100 %   02/04/17 1855 ??? 97 22 (!) 152/107 100 %   02/04/17 1749 ??? 92 ??? (!) 174/112 ???   02/04/17 1734 ??? 94 23 ??? 100 %   02/04/17 1523 98.1 ??F (36.7 ??C) (!) 101 18 (!) 183/111 100 %     No intake or output data in the 24 hours ending 02/05/17 1328     PHYSICAL EXAM:  General: Alert, cooperative, in moderate distress  EENT:  EOMI. Anicteric sclerae. MMM  Resp:  CTA bilaterally, no wheezing or rales.  No accessory muscle use  CV:  Regular  rhythm,?? No edema  GI:  Soft, distended, no rigidity, no guarding  Neurologic:?? Alert and oriented X 3, normal speech,   Psych:???? Fair insight.  Skin:  No rashes.  No jaundice    Reviewed most current lab test results and cultures  YES  Reviewed most current radiology test results   YES  Review and summation of old records today    NO  Reviewed patient's current orders and MAR    YES  PMH/SH reviewed - no change compared to H&P  ________________________________________________________________________  Care Plan discussed with:    Comments   Patient x    Family      RN x    Care Manager     Consultant                        Multidiciplinary team rounds were held today with case manager, nursing, pharmacist and Higher education careers adviser.  Patient's plan of care was discussed; medications were reviewed and discharge planning was addressed.     ________________________________________________________________________  Total NON critical care TIME:  25   Minutes    Total CRITICAL CARE TIME Spent:   Minutes non procedure based      Comments   >50% of visit spent in counseling and coordination of care      ________________________________________________________________________  Ruby Cola  Theodoro Grist, MD     Procedures: see electronic medical records for all procedures/Xrays and details which were not copied into this note but were reviewed prior to creation of Plan.      LABS:  I reviewed today's most current labs and imaging studies.  Pertinent labs include:  Recent Labs     02/04/17  1800   WBC 6.4   HGB 8.0*   HCT 25.5*   PLT 140*     Recent Labs     02/04/17  1800   NA 138   K 5.8*   CL 105   CO2 23   GLU 84   BUN 71*   CREA 12.70*   CA 5.9*   ALB 2.9*   TBILI 0.3   SGOT 25   ALT 17       Signed: Earvin Hansen, MD

## 2017-02-05 NOTE — Progress Notes (Signed)
Physical Therapy  Consult received and chart reviewed. Attempting to see patient for PT evaluation. Patient reports complete independence with all mobility and ADLs; lives alone and drives; uses 3-4 l/min of O2 at baseline. Patient reports he has no PT needs.  Will sign off.  Thank you,  Nicholes StairsSheila R Salter, PT

## 2017-02-05 NOTE — Consults (Signed)
[facility] [address]  [place], [place] [address]       GASTROENTEROLOGY CONSULTATION NOTE      NAME:  David [name]   DOB:   Jul 27, 1970   MRN:   [id number]           Consult Date: 02/05/2017 12:22 PM      History of Present Illness:  Patient is a 45 y.o. who is seen in consultation at the request of Dr. Jamison Neighbor  for diarrhea. He is poor historian and h/o non compliance, on HD, stated that he had C diff last month diagnosed by ER at [facility]. He c/o diarrhea and some abdominal pain .       PMH:  Past Medical History:   Diagnosis Date   ??? Asthma    ??? Chronic kidney disease    ??? Hypertension    ??? Ill-defined condition     Ascites       PSH:  Past Surgical History:   Procedure Laterality Date   ??? HX GI      Groin & Hernia repair   ??? HX VASCULAR ACCESS      PD Catheter placed and removed       Allergies:  Allergies   Allergen Reactions   ??? Lisinopril Swelling   ??? Morphine Swelling   ??? Toradol [Ketorolac] Swelling       Home Medications:  Prior to Admission Medications   Prescriptions Last Dose Informant Patient Reported? Taking?   albuterol (PROVENTIL HFA, VENTOLIN HFA, PROAIR HFA) 90 mcg/actuation inhaler   Yes Yes   Sig: Take  by inhalation.   amLODIPine (NORVASC) 10 mg tablet   Yes Yes   Sig: Take  by mouth daily.   carvedilol (COREG) 12.5 mg tablet   Yes Yes   Sig: Take 12.5 mg by mouth two (2) times daily (with meals).   furosemide (LASIX) 80 mg tablet   Yes Yes   Sig: Take 80 mg by mouth daily.   hydrALAZINE (APRESOLINE) 100 mg tablet   Yes Yes   Sig: Take  by mouth three (3) times daily.   sevelamer (RENAGEL) 800 mg tablet   Yes Yes   Sig: Take  by mouth three (3) times daily (with meals).      Facility-Administered Medications: None       Hospital Medications:  Current Facility-Administered Medications   Medication Dose Route Frequency   ??? ondansetron (ZOFRAN) injection 4 mg  4 mg IntraVENous Q6H PRN   ??? albuterol (PROVENTIL HFA, VENTOLIN HFA, PROAIR HFA) inhaler 2 Puff  2  Puff Inhalation Q6H PRN   ??? ibuprofen (ADVIL;MOTRIN) 100 mg/5 mL oral suspension 400 mg  400 mg Oral Q6H PRN   ??? epoetin alfa (EPOGEN;PROCRIT) injection 10,000 Units  10,000 Units SubCUTAneous Once per day on Thu Sat   ??? sodium chloride (NS) flush 5-10 mL  5-10 mL IntraVENous Q8H   ??? sodium chloride (NS) flush 5-10 mL  5-10 mL IntraVENous PRN   ??? carvedilol (COREG) tablet 12.5 mg  12.5 mg Oral BID WITH MEALS   ??? hydrALAZINE (APRESOLINE) tablet 100 mg  100 mg Oral Q8H   ??? sevelamer carbonate (RENVELA) tab 800 mg  800 mg Oral TID WITH MEALS   ??? amLODIPine (NORVASC) tablet 10 mg  10 mg Oral DAILY   ??? acetaminophen (TYLENOL) tablet 650 mg  650 mg Oral Q4H PRN   ??? labetalol (NORMODYNE;TRANDATE) injection 10 mg  10 mg IntraVENous Q2H  PRN   ??? sodium chloride (NS) flush 5-10 mL  5-10 mL IntraVENous Q8H   ??? sodium chloride (NS) flush 5-10 mL  5-10 mL IntraVENous PRN   ??? vancomycin 50 mg/mL oral solution (compounded) 125 mg  125 mg Oral Q6H       Social History:  Social History     Tobacco Use   ??? Smoking status: Current Every Day Smoker     Packs/day: 0.25   ??? Tobacco comment: 3 cigs. daily   Substance Use Topics   ??? Alcohol use: No     Frequency: Never       Family History:  Family History   Problem Relation Age of Onset   ??? Hypertension Other        Review of Systems:  Constitutional: negative fever, negative chills, negative weight loss  Eyes:   negative visual changes  ENT:   negative sore throat, tongue or lip swelling  Respiratory:  negative cough, negative dyspnea  Cards:  negative for chest pain, palpitations, lower extremity edema  GI:   See HPI  GU:  negative for frequency, dysuria  Integument:  negative for rash and pruritus  Heme:  negative for easy bruising and gum/nose bleeding  Musculoskel: negative for myalgias,  back pain and muscle weakness  Neuro: negative for headaches, dizziness, vertigo  Psych:  negative for feelings of anxiety, depression     Objective:     Patient Vitals for the past 8 hrs:    BP Temp Pulse Resp SpO2   02/05/17 1133 (!) 155/104 98.1 ??F (36.7 ??C) 95 18 100 %   02/05/17 0704 (!) 170/100 97.6 ??F (36.4 ??C) 92 18 99 %     No intake/output data recorded.  No intake/output data recorded.    EXAM:     NEURO-a&o   HEENT-wnl   LUNGS-clear    COR-regular rate and rhythym     ABD-soft , no tenderness, no rebound, good bs     EXT-no edema     Data Review     Recent Labs     02/04/17  1800   WBC 6.4   HGB 8.0*   HCT 25.5*   PLT 140*     Recent Labs     02/04/17  1800   NA 138   K 5.8*   CL 105   CO2 23   BUN 71*   CREA 12.70*   GLU 84   CA 5.9*     Recent Labs     02/04/17  1800   SGOT 25   AP 254*   TP 6.9   ALB 2.9*   GLOB 4.0   LPSE 217     No results for input(s): INR, PTP, APTT in the last 72 hours.    No lab exists for component: INREXT       Assessment:   ?? Diarreha, h/o C diff no records to confirm that  ?? ESRD on HD     Patient Active Problem List   Diagnosis Code   ??? Accelerated hypertension I10     Plan:   ?? renalm diet  ?? Stool testing for C diff  ?? Will follow     Signed By: Wilfred CurtisSouheil Abou-Assi, MD     02/05/2017  12:22 PM

## 2017-02-05 NOTE — Other (Signed)
[facility] [place]  [phone]    Vitals   Pre   Post   Assessment   Pre   Post     Temp  Temp: 98.5 ??F (36.9 ??C) (02/05/17 2138)  98 LOC  A/OX3  Follows commands  agitated A/OX3  Follows commands  agitated   HR   Pulse (Heart Rate): 94 (02/05/17 2138) 108 Lungs   Coarse/dim  4 LPM NC  Coarse/dim  4 LPM NC   B/P   BP: (!) 154/94 (02/05/17 2138) 207/106 Cardiac   Heart sounds auscultated, pulses palpable  Heart sounds auscultated, pulses palpable   Resp   Resp Rate: 18 (02/05/17 2138) 18 Skin   Warm, dry, intact  Warm, dry, intact   Pain level  Pain Intensity 1: 0 (02/05/17 2027) 0 Edema    Pulmonary edema,     Labored  Breathing evident   Breathing improved   Orders:    Duration:   Start:    2135 End:    2335 Total:   2 hrs   Dialyzer:   Dialyzer/Set Up Inspection: Revaclear(Tubing 17L14-9, dialyzer - [id number]) (02/05/17 2138)   K Bath:   Dialysate K (mEq/L): 2 (02/05/17 2138)   Ca Bath:   Dialysate CA (mEq/L): 2.5 (02/05/17 2138)   Na/Bicarb:   Dialysate NA (mEq/L): 140 (02/05/17 2138)   Target Fluid Removal:   Goal/Amount of Fluid to Remove (mL): 3000 mL (02/05/17 2138)   Access     Type & Location:   L [place], Dressing C/D/I, +aspiration/flush with NS   Labs     Obtained/Reviewed   Critical Results Called   Date when labs were drawn-  Hgb-    HGB   Date Value Ref Range Status   02/04/2017 8.0 (L) 12.1 - 17.0 g/dL Final     K-    Potassium   Date Value Ref Range Status   02/04/2017 5.8 (H) 3.5 - 5.1 mmol/L Final     Ca-   Calcium   Date Value Ref Range Status   02/04/2017 5.9 (LL) 8.5 - 10.1 MG/DL Final     Comment:     RESULTS VERIFIED, PHONED TO AND READ BACK BY  ODEGARD RN @ 1903 EM       Bun-   BUN   Date Value Ref Range Status   02/04/2017 71 (H) 6 - 20 MG/DL Final     Creat-   Creatinine   Date Value Ref Range Status   02/04/2017 12.70 (H) 0.70 - 1.30 MG/DL Final        Medications/ Blood Products Given     Name   Dose   Route and Time      Heparin 2.2 +2.2  HD ports at end of tx             Blood Volume Processed (BVP):    46.3  L Net Fluid   Removed:  3000 ml   Comments   Time Out Done: 2130  Primary Nurse Rpt Pre: M. Lynch RN  Primary Nurse Rpt Post: M. Lynch RN  Pt Education: importance of   compliance  Care Plan: Per nephrologist  Tx Summary: pt. Tolerated tx well. At the end all blood in circuit returned  With 300 ml of NS Clamps Secured and sterile caps applied. Hep dwell instilled.  Consent signed - Informed Consent Verified: Yes (02/05/17 2138)  [facility] Consent - Obtained  Hepatitis Status- drawn 02/05/17  Machine #- Machine Number:  [id number] (02/05/17 2138)  Telemetry status- monitored    remotely  Pre-dialysis wt.- Pre-Dialysis Weight: 111.5 kg (245 lb 13 oz) (02/05/17 2138)

## 2017-02-05 NOTE — Consults (Signed)
[facility] Consult Note        NAME: David [name]       DOB:  25-May-1970       MRN:  [id number]     Date/Time: 02/05/2017    Risk of deterioration: medium       Assessment:    Plan:  ESRD-without a unit --  Non compliance  Cdiff  HTN  PC  CAD with hx of stent  HX of drug seeking behavior HD today-2 hours  2 k bath--he is on schedule-[facility] has to travel to him b/c of the "C diff"-having an abbreviated treatment today due to the high number of IP needing HD today. (Pt typically does not dialyze a full HD treatment so I will feel this will be a success IF he does a 2 hour treatment)  Pt does not have a hd unit as he was fired from [employer] [place] atleast a year ago.  He has had [name] admissions at this hospital, [facility] and [facility] for the above-  He frequents the ER, has a hx of leaving AMA.  Has been evaluated for a stool tx in the past but didn't stay to get this done (I believe that was at [facility])  Pt can be abusive to staff/nursing.  Of note-pt signed in the ED last night going by his middle name and claiming he "wasn't David [name] for further information please go under his other CC chart-he was last at [facility] in august where he had cp/abd pain-was seen by dr. Currie [name] at that time by VCS.  Next HD Monday-will see again then.  Call if problems arise tomorrow.   Asked to see for HD needs-Of note, pt admitted under his middle name--told ER [name] he was NOT David [name] and didn't have ID.  Admitted to Dr. Kaleen Odea he was David [name].  Please see all the admissions and extensive work ups/evaluations this pt has had over the last 1.5 years-    Subjective:     Chief Complaint:  "I need my dialysis"    Review of Systems: (+) abd pain, cp, fever  (+) diarrhea -subjective-goes from ER to Er for dialysis (+) hx of drug seeking/manipulative behavior    Objective:     VITALS:   Last 24hrs VS reviewed since prior progress note. Most recent are:  Visit Vitals  BP (!) 155/104 (BP 1 Location: Right arm, BP Patient Position:  Sitting)   Pulse 95   Temp 98.1 ??F (36.7 ??C)   Resp 18   Ht '6\' 1"'  (1.854 m)   Wt 111.5 kg (245 lb 13 oz)   SpO2 100%   BMI 32.43 kg/m??     SpO2 Readings from Last 6 Encounters:   02/05/17 100%    O2 Flow Rate (L/min): 4 l/min   No intake or output data in the 24 hours ending 02/05/17 1139     Telemetry Reviewed     PHYSICAL EXAM:    General   well developed, well nourished, appears stated age, in no acute distress  EENT  NCAT, EOMI  Respiratory   Decreased bs in bases  Cardiology  RRR, systolic murmur, (+) PC  Extremities  (+) edema              Lab Data Reviewed: (see below)    Medications Reviewed: (see below)    PMH/SH reviewed - no change compared to H&P  ________________________________________  ___________________________________________________    Attending Physician: David Roers, MD  ____________________________________________________  MEDICATIONS:  Current Facility-Administered Medications   Medication Dose Route Frequency   ??? ondansetron (ZOFRAN) injection 4 mg  4 mg IntraVENous Q6H PRN   ??? albuterol (PROVENTIL HFA, VENTOLIN HFA, PROAIR HFA) inhaler 2 Puff  2 Puff Inhalation Q6H PRN   ??? ibuprofen (ADVIL;MOTRIN) 100 mg/5 mL oral suspension 400 mg  400 mg Oral Q6H PRN   ??? sodium chloride (NS) flush 5-10 mL  5-10 mL IntraVENous Q8H   ??? sodium chloride (NS) flush 5-10 mL  5-10 mL IntraVENous PRN   ??? carvedilol (COREG) tablet 12.5 mg  12.5 mg Oral BID WITH MEALS   ??? hydrALAZINE (APRESOLINE) tablet 100 mg  100 mg Oral Q8H   ??? sevelamer carbonate (RENVELA) tab 800 mg  800 mg Oral TID WITH MEALS   ??? amLODIPine (NORVASC) tablet 10 mg  10 mg Oral DAILY   ??? acetaminophen (TYLENOL) tablet 650 mg  650 mg Oral Q4H PRN   ??? labetalol (NORMODYNE;TRANDATE) injection 10 mg  10 mg IntraVENous Q2H PRN   ??? sodium chloride (NS) flush 5-10 mL  5-10 mL IntraVENous Q8H   ??? sodium chloride (NS) flush 5-10 mL  5-10 mL IntraVENous PRN   ??? vancomycin 50 mg/mL oral solution (compounded) 125 mg  125 mg Oral Q6H         LABS:  Recent Labs     02/04/17  1800   WBC 6.4   HGB 8.0*   HCT 25.5*   PLT 140*     Recent Labs     02/04/17  1800   NA 138   K 5.8*   CL 105   CO2 23   BUN 71*   CREA 12.70*   GLU 84   CA 5.9*     Recent Labs     02/04/17  1800   SGOT 25   ALT 17   AP 254*   TBILI 0.3   TP 6.9   ALB 2.9*   GLOB 4.0   LPSE 217     No results for input(s): INR, PTP, APTT in the last 72 hours.    No lab exists for component: INREXT   No results for input(s): FE, TIBC, PSAT, FERR in the last 72 hours.   No results for input(s): PH, PCO2, PO2 in the last 72 hours.  Recent Labs     02/04/17  1800   CKNDX 2.1   TROIQ 0.08*     No results found for: GLUCPOC

## 2017-02-05 NOTE — Consults (Signed)
Consults  by Wilfred Curtis, MD at 02/05/17 1221                Author: Wilfred Curtis, MD  Service: Gastroenterology  Author Type: Physician       Filed: 02/05/17 1224  Date of Service: 02/05/17 1221  Status: Signed          Editor: Wilfred Curtis, MD (Physician)            Consult Orders        1. IP CONSULT TO GASTROENTEROLOGY [[id number]] ordered by Ned Grace, MD at 02/04/17 2121                                 [facility]   [address]   [place], [place] [address]           GASTROENTEROLOGY CONSULTATION NOTE             NAME:  David [name]     DOB:   [birth year]    MRN:   [id number]                 Consult Date: 02/05/2017  12:22 PM         History of Present Illness:  Patient is a  46 y.o. who is seen in consultation at the request of Dr. Jamison Neighbor  for diarrhea. He is poor historian and h/o non compliance, on HD, stated that he had C diff last month diagnosed by ER at [facility]. He c/o diarrhea and some abdominal pain .          PMH:     Past Medical History:        Diagnosis  Date         ?  Asthma       ?  Chronic kidney disease       ?  Hypertension       ?  Ill-defined condition            Ascites           PSH:     Past Surgical History:         Procedure  Laterality  Date          ?  HX GI              Groin & Hernia repair          ?  HX VASCULAR ACCESS              PD Catheter placed and removed           Allergies:     Allergies        Allergen  Reactions         ?  Lisinopril  Swelling     ?  Morphine  Swelling         ?  Toradol [Ketorolac]  Swelling           Home Medications:     Prior to Admission Medications     Prescriptions  Last Dose  Informant  Patient Reported?  Taking?      albuterol (PROVENTIL HFA, VENTOLIN HFA, PROAIR HFA) 90 mcg/actuation inhaler      Yes  Yes      Sig: Take  by inhalation.      amLODIPine (NORVASC) 10 mg tablet  Yes  Yes      Sig: Take  by mouth daily.      carvedilol (COREG) 12.5 mg tablet      Yes  Yes       Sig: Take 12.5 mg by mouth two (2) times daily (with meals).      furosemide (LASIX) 80 mg tablet      Yes  Yes      Sig: Take 80 mg by mouth daily.      hydrALAZINE (APRESOLINE) 100 mg tablet      Yes  Yes      Sig: Take  by mouth three (3) times daily.      sevelamer (RENAGEL) 800 mg tablet      Yes  Yes      Sig: Take  by mouth three (3) times daily (with meals).               Facility-Administered Medications: None           Hospital Medications:     Current Facility-Administered Medications          Medication  Dose  Route  Frequency           ?  ondansetron (ZOFRAN) injection 4 mg   4 mg  IntraVENous  Q6H PRN     ?  albuterol (PROVENTIL HFA, VENTOLIN HFA, PROAIR HFA) inhaler 2 Puff   2 Puff  Inhalation  Q6H PRN     ?  ibuprofen (ADVIL;MOTRIN) 100 mg/5 mL oral suspension 400 mg   400 mg  Oral  Q6H PRN     ?  epoetin alfa (EPOGEN;PROCRIT) injection 10,000 Units   10,000 Units  SubCUTAneous  Once per day on Thu Sat     ?  sodium chloride (NS) flush 5-10 mL   5-10 mL  IntraVENous  Q8H     ?  sodium chloride (NS) flush 5-10 mL   5-10 mL  IntraVENous  PRN     ?  carvedilol (COREG) tablet 12.5 mg   12.5 mg  Oral  BID WITH MEALS     ?  hydrALAZINE (APRESOLINE) tablet 100 mg   100 mg  Oral  Q8H     ?  sevelamer carbonate (RENVELA) tab 800 mg   800 mg  Oral  TID WITH MEALS     ?  amLODIPine (NORVASC) tablet 10 mg   10 mg  Oral  DAILY           ?  acetaminophen (TYLENOL) tablet 650 mg   650 mg  Oral  Q4H PRN           ?  labetalol (NORMODYNE;TRANDATE) injection 10 mg   10 mg  IntraVENous  Q2H PRN     ?  sodium chloride (NS) flush 5-10 mL   5-10 mL  IntraVENous  Q8H     ?  sodium chloride (NS) flush 5-10 mL   5-10 mL  IntraVENous  PRN           ?  vancomycin 50 mg/mL oral solution (compounded) 125 mg   125 mg  Oral  Q6H           Social History:     Social History          Tobacco Use         ?  Smoking status:  Current Every Day Smoker              Packs/day:  0.25        ?  Tobacco comment: 3 cigs. daily       Substance  Use Topics         ?  Alcohol use:  No              Frequency:  Never           Family History:     Family History         Problem  Relation  Age of Onset          ?  Hypertension  Other             Review of Systems:   Constitutional: negative fever, negative chills, negative weight loss   Eyes:   negative visual changes   ENT:   negative sore throat, tongue or lip swelling   Respiratory:  negative cough, negative dyspnea   Cards:  negative for chest pain, palpitations, lower extremity edema   GI:   See HPI   GU:  negative for frequency, dysuria   Integument:  negative for rash and pruritus   Heme:  negative for easy bruising and gum/nose bleeding   Musculoskel: negative for myalgias,  back pain and muscle weakness   Neuro: negative for headaches, dizziness, vertigo   Psych:  negative for feelings of anxiety, depression         Objective:        Patient Vitals for the past 8 hrs:            BP  Temp  Pulse  Resp  SpO2            02/05/17 1133  (!) 155/104  98.1 ??F (36.7 ??C)  95  18  100 %            02/05/17 0704  (!) 170/100  97.6 ??F (36.4 ??C)  92  18  99 %        No intake/output data recorded.   No intake/output data recorded.      EXAM:      NEURO-a&o    HEENT-wnl    LUNGS-clear     COR-regular rate and rhythym      ABD-soft , no tenderness, no rebound, good bs      EXT-no edema       Data Review         Recent Labs           02/04/17   1800     WBC  6.4     HGB  8.0*     HCT  25.5*        PLT  140*          Recent Labs           02/04/17   1800     NA  138     K  5.8*     CL  105     CO2  23     BUN  71*     CREA  12.70*     GLU  84        CA  5.9*          Recent Labs           02/04/17   1800     SGOT  25     AP  254*     TP  6.9     ALB  2.9*  GLOB  4.0        LPSE  217        No results for input(s): INR, PTP, APTT in the last 72 hours.      No lab exists for component: INREXT            Assessment:     ??    Diarreha, h/o C diff no records to confirm that   ??  ESRD on HD          Patient Active Problem  List        Diagnosis  Code         ?  Accelerated hypertension  I10          Plan:     ??    renalm diet   ??  Stool testing for C diff   ??  Will follow           Signed By:  Wilfred Curtis, MD        02/05/2017  12:22 PM

## 2017-02-05 NOTE — Consults (Signed)
Cardiology Consult Note    CC: dyspnea    POE:UMPNT, Phys, MD  Reason for consult: CP  Requesting MD:  Dr. Kaleen Odea  Admit Date: 02/04/2017   Today's Date: 02/05/2017    Cardiac Assessment/Plan:   CP: Very long h/o intermittent non-cardiac CP; Worse when non-compliant with HD.  CHRONIC INDETERMINANT TROPONIN; Not have ACS/CAD Sx; No further cardiac w/u indicated.    HTN: should continue to improve with vol control/HD compliance.    Rhythm: sinus    ESRD, Dispo: per primary team.     No active cardiac issues; will sign-off; Please call if questions/issues.  F/U with PCP/renal after d/c. Thanks.    Hospital Problem List:  Active Problems:    Accelerated hypertension (02/04/2017)       ____________________________________________________________________  David [name] is a 46 y.o. male presents with Accelerated hypertension.    As noted in H&P: "46 y.o.  African American male presents to ED with complaint noted above. Available records were reviewed at the time of H&P. Patient gave his name and DOB not as he has in the past - he is to be under CLAXTON LEVITZ DOB Apr 03, 2071 MRN: [id number] - registration is looking to correct.   ??  Patient with PMH for end-stage renal disease on hemodialysis Mondays, Wednesdays, Fridays, ascites, asthma, obesity, chronic hypoxic respiratory failure, hypertension, myocardial infarction, coronary artery disease, obstructive sleep apnea, C. diff colitis, spontaneous bacterial peritonitis just admitted and left AMA from [facility]. He has complex hx for the recurrence of cdiff failed therapy and was for stool transplant but left AMA. He now returns with x4 days of intermittent chest pressure/heaviness, missed HD, SOB and abdominal pain with intermittent diarrhea that occasionally is bloody in color. The chest heaviness "feels just like the time in 11/2016". He was at [facility] with similar presentation of abdominal pain, diarrhea - bloody, and chest pain seen by Cardiology Dr Currie Paris. Found no evidence to  support ACS. Further only found ascites. He was dialyzed. During work up he opted to leave AMA and therefore further work up was uanble to be completed. Patient did not go to HD over this week therefore he has missed >2 sessions. He feels like his weight is stable. Poor PO intake and n/v with diarrhea that occasionally is bloody in character, noncramping. No fevers. No sick contacts. In ED noted to have mild increase in potassium to 5.8. No peaked T waves on ecg. Calcium found low at 5.9 corrects to 6.5 with albumin factored. No wbc. hgb low at 8. Vss. Trop 0.08 seems in line with his prior values from august.   No complaints of dizziness, lightheadedness, focal weakness, new onset of numbness, paresthesias, facial droop, headache, visual disturbance, neck pain, current shortness of breath, palpitations, calf pain and swelling, edema, rash, head or neck trauma or falls.   We were asked to see for work up and evaluation"    Long h/o non-compliance; reported drug seeking behavior.  ______________________________________________________________________    The patient reports "new chest pain" for last week or so; lasts hours at a time; left pressure sensation but also worse with palpation.  He intimally stated he hadn't had previous CP: when reminded about admission in 04/2013, 02/2016, 05/2016, and 11/2016 (other unknown), he indicated those admits were mainly for dyspnea (records reviewed indicate otherwise).    No PND, orthopnea, palpitations, pre-syncope, syncope, peripheral edema, or decrease in exercise tolerance.  No current complaints.    ECG: sinus; normal.  Tele: sinus  CXR: "  Cardiomegaly with mild pulmonary vascular congestion and trace pleural Effusions."  Abd/pelvis CT: "1. No apparent inflammation.   2. Ascites, pleural effusions and third spacing body wall edema. "    Notable labs: Cr 12; K 5.8; CK 1.4k; trop 0.08. Hg 8.    Notable prior cardiac history:   LONG H/O NON-CARDIAC CP/chronic indeterminate  troponin.    Normal cath 04/28/13 for CP/elevated troponin @ [facility]. Neg MPI for ischemia 02/2016 @ [facility]; Declined stress test @ [facility] 11/2016.  Echo 10/2016 @ [facility]: Ef 50%; Echo 04/2013 @ [facility]: EF 50-55%; mild MR.    [facility]/CP/dyspnea.      ______________________________________________________________________  Patient Active Problem List    Diagnosis Date Noted   ??? Accelerated hypertension 02/04/2017        Past Medical History:   Diagnosis Date   ??? Asthma    ??? Chronic kidney disease    ??? Hypertension    ??? Ill-defined condition     Ascites      Past Surgical History:   Procedure Laterality Date   ??? HX GI      Groin & Hernia repair   ??? HX VASCULAR ACCESS      PD Catheter placed and removed     Allergies   Allergen Reactions   ??? Lisinopril Swelling   ??? Morphine Swelling   ??? Toradol [Ketorolac] Swelling      Family History   Problem Relation Age of Onset   ??? Hypertension Other       Social History     Socioeconomic History   ??? Marital status: DIVORCED     Spouse name: Not on file   ??? Number of children: Not on file   ??? Years of education: Not on file   ??? Highest education level: Not on file   Social Needs   ??? Financial resource strain: Not on file   ??? Food insecurity - worry: Not on file   ??? Food insecurity - inability: Not on file   ??? Transportation needs - medical: Not on file   ??? Transportation needs - non-medical: Not on file   Occupational History   ??? Not on file   Tobacco Use   ??? Smoking status: Current Every Day Smoker     Packs/day: 0.25   ??? Tobacco comment: 3 cigs. daily   Substance and Sexual Activity   ??? Alcohol use: No     Frequency: Never   ??? Drug use: No   ??? Sexual activity: Not on file   Other Topics Concern   ??? Not on file   Social History Narrative   ??? Not on file     Current Facility-Administered Medications   Medication Dose Route Frequency   ??? ondansetron (ZOFRAN) injection 4 mg  4 mg IntraVENous Q6H PRN   ??? albuterol (PROVENTIL HFA, VENTOLIN  HFA, PROAIR HFA) inhaler 2 Puff  2 Puff Inhalation Q6H PRN   ??? ibuprofen (ADVIL;MOTRIN) 100 mg/5 mL oral suspension 400 mg  400 mg Oral Q6H PRN   ??? epoetin alfa (EPOGEN;PROCRIT) injection 10,000 Units  10,000 Units SubCUTAneous Once per day on Thu Sat   ??? sodium chloride (NS) flush 5-10 mL  5-10 mL IntraVENous Q8H   ??? sodium chloride (NS) flush 5-10 mL  5-10 mL IntraVENous PRN   ??? carvedilol (COREG) tablet 12.5 mg  12.5 mg Oral BID WITH MEALS   ??? hydrALAZINE (APRESOLINE) tablet 100 mg  100  mg Oral Q8H   ??? sevelamer carbonate (RENVELA) tab 800 mg  800 mg Oral TID WITH MEALS   ??? amLODIPine (NORVASC) tablet 10 mg  10 mg Oral DAILY   ??? acetaminophen (TYLENOL) tablet 650 mg  650 mg Oral Q4H PRN   ??? labetalol (NORMODYNE;TRANDATE) injection 10 mg  10 mg IntraVENous Q2H PRN   ??? sodium chloride (NS) flush 5-10 mL  5-10 mL IntraVENous Q8H   ??? sodium chloride (NS) flush 5-10 mL  5-10 mL IntraVENous PRN   ??? vancomycin 50 mg/mL oral solution (compounded) 125 mg  125 mg Oral Q6H        Prior to Admission Medications:  Prior to Admission medications    Medication Sig Start Date End Date Taking? Authorizing Provider   hydrALAZINE (APRESOLINE) 100 mg tablet Take  by mouth three (3) times daily.   Yes Other, Phys, MD   amLODIPine (NORVASC) 10 mg tablet Take  by mouth daily.   Yes Other, Phys, MD   carvedilol (COREG) 12.5 mg tablet Take 12.5 mg by mouth two (2) times daily (with meals).   Yes Other, Phys, MD   sevelamer (RENAGEL) 800 mg tablet Take  by mouth three (3) times daily (with meals).   Yes Other, Phys, MD   albuterol (PROVENTIL HFA, VENTOLIN HFA, PROAIR HFA) 90 mcg/actuation inhaler Take  by inhalation.   Yes Other, Phys, MD   furosemide (LASIX) 80 mg tablet Take 80 mg by mouth daily.   Yes Other, Phys, MD        Review of Symptoms: As noted in H&P:  "REVIEW OF SYSTEMS:  See HPI for details  General: negative for fever, chills, sweats, weakness, weight loss  Eyes: negative for blurred vision, eye pain, loss of vision,  diplopia  Ear Nose and Throat: negative for rhinorrhea, pharyngitis, otalgia, tinnitus, speech or swallowing difficulties  Respiratory:  negative for pleuritic pain, cough, sputum production, wheezing, ++SOB, DOE  Cardiology:  +chronic chest pain, no palpitations, orthopnea, PND, +LE edema, no syncope   Gastrointestinal: +abdominal pain, +N/V, no dysphagia, change in bowel habits, +hematochezia  Genitourinary: negative for frequency, urgency, dysuria, hematuria, incontinence  Muskuloskeletal : negative for arthralgia, myalgia  Hematology: negative for easy bruising, bleeding, lymphadenopathy  Dermatological: negative for rash, ulceration, mole change, new lesion  Endocrine: negative for hot flashes or polydipsia  Neurological: negative for headache, dizziness, confusion, focal weakness, paresthesia, memory loss, gait disturbance  Psychological: negative for anxiety, depression, agitation ".     24 hr VS reviewed, overall VSSAF  Temp (24hrs), Avg:98.2 ??F (36.8 ??C), Min:97.6 ??F (36.4 ??C), Max:98.6 ??F (37 ??C)    Patient Vitals for the past 8 hrs:   Pulse   02/05/17 1133 95   02/05/17 0704 92    Patient Vitals for the past 8 hrs:   Resp   02/05/17 1133 18   02/05/17 0704 18    Patient Vitals for the past 8 hrs:   BP   02/05/17 1133 (!) 155/104   02/05/17 0704 (!) 170/100        No intake or output data in the 24 hours ending 02/05/17 1402      Physical Exam (complete single organ system exam)    Cons: The patient is no distress. Appears stated age.   HEENT: Normal conjunctivae and palate. No xanthelasma.  Neck: Flat JVP without appreciable HJR.  Resp: Normal respiratory effort with clear lungs bilaterally.   CV: Regular rate and rhythm.   PMI not palpated.  Normal S1,S2  No gallop or rubs appreciated.  No murmur appreciated.  Intact carotid upstroke bilaterally without appreciated bruits.  Abdominal aorta not palpated; no abdominal bruit noted.  Present pedal pulses.   + peripheral edema/abd wall edema.  GI: No abd mass  noted, soft; no organomegaly noted.  Bowel sounds present.   Muscular:  No significant kyphosis.  Strength WNL for age.  Ext: No cyanosis, clubbing, or stigmata of peripheral embolization.   Derm: No ulcers or stasis dermatitis of lower extremities.   Neuro: Alert and oriented x 3;  Grossly non-focal. Normal mood and affect.     Labs:   Recent Results (from the past 24 hour(s))   EKG, 12 LEAD, INITIAL    Collection Time: 02/04/17  3:25 PM   Result Value Ref Range    Ventricular Rate 99 BPM    Atrial Rate 99 BPM    P-R Interval 188 ms    QRS Duration 84 ms    Q-T Interval 380 ms    QTC Calculation (Bezet) 487 ms    Calculated P Axis 63 degrees    Calculated R Axis -23 degrees    Calculated T Axis 86 degrees    Diagnosis       Normal sinus rhythm  Possible Left atrial enlargement  Prolonged QT  No previous ECGs available     CBC WITH AUTOMATED DIFF    Collection Time: 02/04/17  6:00 PM   Result Value Ref Range    WBC 6.4 4.1 - 11.1 K/uL    RBC 2.67 (L) 4.10 - 5.70 M/uL    HGB 8.0 (L) 12.1 - 17.0 g/dL    HCT 25.5 (L) 36.6 - 50.3 %    MCV 95.5 80.0 - 99.0 FL    MCH 30.0 26.0 - 34.0 PG    MCHC 31.4 30.0 - 36.5 g/dL    RDW 17.0 (H) 11.5 - 14.5 %    PLATELET 140 (L) 150 - 400 K/uL    MPV 9.5 8.9 - 12.9 FL    NRBC 0.0 0 PER 100 WBC    ABSOLUTE NRBC 0.00 0.00 - 0.01 K/uL    NEUTROPHILS 80 (H) 32 - 75 %    LYMPHOCYTES 6 (L) 12 - 49 %    MONOCYTES 7 5 - 13 %    EOSINOPHILS 5 0 - 7 %    BASOPHILS 1 0 - 1 %    IMMATURE GRANULOCYTES 1 (H) 0.0 - 0.5 %    ABS. NEUTROPHILS 5.1 1.8 - 8.0 K/UL    ABS. LYMPHOCYTES 0.4 (L) 0.8 - 3.5 K/UL    ABS. MONOCYTES 0.4 0.0 - 1.0 K/UL    ABS. EOSINOPHILS 0.3 0.0 - 0.4 K/UL    ABS. BASOPHILS 0.1 0.0 - 0.1 K/UL    ABS. IMM. GRANS. 0.1 (H) 0.00 - 0.04 K/UL    DF AUTOMATED      RBC COMMENTS ANISOCYTOSIS  1+       METABOLIC PANEL, COMPREHENSIVE    Collection Time: 02/04/17  6:00 PM   Result Value Ref Range    Sodium 138 136 - 145 mmol/L    Potassium 5.8 (H) 3.5 - 5.1 mmol/L    Chloride 105 97 - 108  mmol/L    CO2 23 21 - 32 mmol/L    Anion gap 10 5 - 15 mmol/L    Glucose 84 65 - 100 mg/dL    BUN 71 (H) 6 - 20 MG/DL    Creatinine 12.70 (  H) 0.70 - 1.30 MG/DL    BUN/Creatinine ratio 6 (L) 12 - 20      GFR est AA 5 (L) >60 ml/min/1.58m    GFR est non-AA 4 (L) >60 ml/min/1.716m   Calcium 5.9 (LL) 8.5 - 10.1 MG/DL    Bilirubin, total 0.3 0.2 - 1.0 MG/DL    ALT (SGPT) 17 12 - 78 U/L    AST (SGOT) 25 15 - 37 U/L    Alk. phosphatase 254 (H) 45 - 117 U/L    Protein, total 6.9 6.4 - 8.2 g/dL    Albumin 2.9 (L) 3.5 - 5.0 g/dL    Globulin 4.0 2.0 - 4.0 g/dL    A-G Ratio 0.7 (L) 1.1 - 2.2     CK W/ REFLX CKMB    Collection Time: 02/04/17  6:00 PM   Result Value Ref Range    CK 1,497 (H) 39 - 308 U/L   TROPONIN I    Collection Time: 02/04/17  6:00 PM   Result Value Ref Range    Troponin-I, Qt. 0.08 (H) <0.05 ng/mL   LIPASE    Collection Time: 02/04/17  6:00 PM   Result Value Ref Range    Lipase 217 73 - 393 U/L   CK-MB,QUANT.    Collection Time: 02/04/17  6:00 PM   Result Value Ref Range    CK - MB 31.5 (H) <3.6 NG/ML    CK-MB Index 2.1 0 - 2.5     EKG, 12 LEAD, INITIAL    Collection Time: 02/05/17 11:41 AM   Result Value Ref Range    Ventricular Rate 88 BPM    Atrial Rate 88 BPM    P-R Interval 202 ms    QRS Duration 88 ms    Q-T Interval 404 ms    QTC Calculation (Bezet) 488 ms    Calculated P Axis 55 degrees    Calculated R Axis -26 degrees    Calculated T Axis 67 degrees    Diagnosis       Normal sinus rhythm  Prolonged QT  When compared with ECG of 04-Feb-2017 15:25,  MANUAL COMPARISON REQUIRED, DATA IS UNCONFIRMED       Recent Labs     02/04/17  1800   CKMB 31.5*   CKNDX 2.1   TROIQ 0.08*       Recent Labs     02/04/17  1800   NA 138   K 5.8*   CL 105   CO2 23   BUN 71*   CREA 12.70*   GFRAA 5*   GLU 84   CA 5.9*   ALB 2.9*   WBC 6.4   HGB 8.0*   HCT 25.5*   PLT 140*       DeAugust SaucerMD

## 2017-02-06 ENCOUNTER — Inpatient Hospital Stay
Admit: 2017-02-06 | Discharge: 2017-02-06 | Disposition: A | Discharge status: Home / Self Care | Payer: MEDICARE | Attending: Emergency Medicine

## 2017-02-06 ENCOUNTER — Emergency Department: Admit: 2017-02-06 | Payer: MEDICARE

## 2017-02-06 ENCOUNTER — Emergency Department: Payer: MEDICARE

## 2017-02-06 ENCOUNTER — Emergency Department

## 2017-02-06 DIAGNOSIS — R509 Fever, unspecified: Secondary | ICD-10-CM

## 2017-02-06 LAB — CBC WITH AUTOMATED DIFF
ABS. BASOPHILS: 0 10*3/uL (ref 0.0–0.1)
ABS. BASOPHILS: 0 10*3/uL (ref 0.0–0.1)
ABS. EOSINOPHILS: 0.3 10*3/uL (ref 0.0–0.4)
ABS. EOSINOPHILS: 0.3 10*3/uL (ref 0.0–0.4)
ABS. IMM. GRANS.: 0.1 10*3/uL — ABNORMAL HIGH (ref 0.00–0.04)
ABS. IMM. GRANS.: 0 10*3/uL (ref 0.00–0.04)
ABS. LYMPHOCYTES: 0.3 10*3/uL — ABNORMAL LOW (ref 0.8–3.5)
ABS. LYMPHOCYTES: 0.3 10*3/uL — ABNORMAL LOW (ref 0.8–3.5)
ABS. MONOCYTES: 0.4 10*3/uL (ref 0.0–1.0)
ABS. MONOCYTES: 0.4 10*3/uL (ref 0.0–1.0)
ABS. NEUTROPHILS: 4.1 10*3/uL (ref 1.8–8.0)
ABS. NEUTROPHILS: 4.2 10*3/uL (ref 1.8–8.0)
ABSOLUTE NRBC: 0 10*3/uL (ref 0.00–0.01)
ABSOLUTE NRBC: 0 10*3/uL (ref 0.00–0.01)
BASOPHILS: 0 % (ref 0–1)
BASOPHILS: 0 % (ref 0–1)
EOSINOPHILS: 6 % (ref 0–7)
EOSINOPHILS: 5 % (ref 0–7)
HCT: 22.4 % — ABNORMAL LOW (ref 36.6–50.3)
HCT: 26.3 % — ABNORMAL LOW (ref 36.6–50.3)
HGB: 7 g/dL — ABNORMAL LOW (ref 12.1–17.0)
HGB: 8.1 g/dL — ABNORMAL LOW (ref 12.1–17.0)
IMMATURE GRANULOCYTES: 1 % — ABNORMAL HIGH (ref 0.0–0.5)
IMMATURE GRANULOCYTES: 0 % (ref 0.0–0.5)
LYMPHOCYTES: 5 % — ABNORMAL LOW (ref 12–49)
LYMPHOCYTES: 5 % — ABNORMAL LOW (ref 12–49)
MCH: 29.8 PG (ref 26.0–34.0)
MCH: 30.1 PG (ref 26.0–34.0)
MCHC: 31.3 g/dL (ref 30.0–36.5)
MCHC: 30.8 g/dL (ref 30.0–36.5)
MCV: 95.3 FL (ref 80.0–99.0)
MCV: 97.8 FL (ref 80.0–99.0)
MONOCYTES: 8 % (ref 5–13)
MONOCYTES: 8 % (ref 5–13)
MPV: 9.8 FL (ref 8.9–12.9)
MPV: 9.8 FL (ref 8.9–12.9)
NEUTROPHILS: 80 % — ABNORMAL HIGH (ref 32–75)
NEUTROPHILS: 82 % — ABNORMAL HIGH (ref 32–75)
NRBC: 0 PER 100 WBC
NRBC: 0 PER 100 WBC
PLATELET: 123 10*3/uL — ABNORMAL LOW (ref 150–400)
PLATELET: 138 10*3/uL — ABNORMAL LOW (ref 150–400)
RBC: 2.35 M/uL — ABNORMAL LOW (ref 4.10–5.70)
RBC: 2.69 M/uL — ABNORMAL LOW (ref 4.10–5.70)
RDW: 17 % — ABNORMAL HIGH (ref 11.5–14.5)
RDW: 16.8 % — ABNORMAL HIGH (ref 11.5–14.5)
WBC: 5.2 10*3/uL (ref 4.1–11.1)
WBC: 5.2 10*3/uL (ref 4.1–11.1)

## 2017-02-06 LAB — LIPID PANEL
CHOL/HDL Ratio: 1.8 (ref 0–5.0)
Cholesterol, total: 135 MG/DL (ref <200)
HDL Cholesterol: 74 MG/DL
LDL, calculated: 50.6 MG/DL (ref 0–100)
Triglyceride: 52 MG/DL (ref <150)
VLDL, calculated: 10.4 MG/DL

## 2017-02-06 LAB — METABOLIC PANEL, BASIC
Anion gap: 13 mmol/L (ref 5–15)
BUN/Creatinine ratio: 6 — ABNORMAL LOW (ref 12–20)
BUN: 78 MG/DL — ABNORMAL HIGH (ref 6–20)
CO2: 20 mmol/L — ABNORMAL LOW (ref 21–32)
Calcium: 5.5 MG/DL — CL (ref 8.5–10.1)
Chloride: 106 mmol/L (ref 97–108)
Creatinine: 14 MG/DL — ABNORMAL HIGH (ref 0.70–1.30)
GFR est AA: 5 mL/min/{1.73_m2} — ABNORMAL LOW (ref >60)
GFR est non-AA: 4 mL/min/{1.73_m2} — ABNORMAL LOW (ref >60)
Glucose: 120 mg/dL — ABNORMAL HIGH (ref 65–100)
Potassium: 5.9 mmol/L — ABNORMAL HIGH (ref 3.5–5.1)
Sodium: 139 mmol/L (ref 136–145)

## 2017-02-06 LAB — METABOLIC PANEL, COMPREHENSIVE
A-G Ratio: 0.7 — ABNORMAL LOW (ref 1.1–2.2)
ALT (SGPT): 13 U/L (ref 12–78)
AST (SGOT): 16 U/L (ref 15–37)
Albumin: 2.5 g/dL — ABNORMAL LOW (ref 3.5–5.0)
Alk. phosphatase: 199 U/L — ABNORMAL HIGH (ref 45–117)
Anion gap: 15 mmol/L (ref 5–15)
BUN/Creatinine ratio: 5 — ABNORMAL LOW (ref 12–20)
BUN: 58 MG/DL — ABNORMAL HIGH (ref 6–20)
Bilirubin, total: 0.4 MG/DL (ref 0.2–1.0)
CO2: 20 mmol/L — ABNORMAL LOW (ref 21–32)
Calcium: 5.9 MG/DL — CL (ref 8.5–10.1)
Chloride: 107 mmol/L (ref 97–108)
Creatinine: 11.7 MG/DL — ABNORMAL HIGH (ref 0.70–1.30)
GFR est AA: 6 mL/min/{1.73_m2} — ABNORMAL LOW (ref >60)
GFR est non-AA: 5 mL/min/{1.73_m2} — ABNORMAL LOW (ref >60)
Globulin: 3.6 g/dL (ref 2.0–4.0)
Glucose: 107 mg/dL — ABNORMAL HIGH (ref 65–100)
Potassium: 4.6 mmol/L (ref 3.5–5.1)
Protein, total: 6.1 g/dL — ABNORMAL LOW (ref 6.4–8.2)
Sodium: 142 mmol/L (ref 136–145)

## 2017-02-06 LAB — TROPONIN I
Troponin-I, Qt.: 0.06 ng/mL — ABNORMAL HIGH (ref <0.05)
Troponin-I, Qt.: 0.11 ng/mL — ABNORMAL HIGH (ref <0.05)

## 2017-02-06 LAB — EKG, 12 LEAD, INITIAL
Atrial Rate: 88 {beats}/min
Calculated P Axis: 55 degrees
Calculated R Axis: -26 degrees
Calculated T Axis: 67 degrees
Diagnosis: NORMAL
P-R Interval: 202 ms
Q-T Interval: 404 ms
QRS Duration: 88 ms
QTC Calculation (Bezet): 488 ms
Ventricular Rate: 88 {beats}/min

## 2017-02-06 LAB — LACTIC ACID: Lactic acid: 1.1 MMOL/L (ref 0.4–2.0)

## 2017-02-06 LAB — CK W/ CKMB & INDEX
CK - MB: 30.3 NG/ML — ABNORMAL HIGH (ref <3.6)
CK-MB Index: 2.1 (ref 0–2.5)
CK: 1450 U/L — ABNORMAL HIGH (ref 39–308)

## 2017-02-06 LAB — PHOSPHORUS
Phosphorus: 6.9 MG/DL — ABNORMAL HIGH (ref 2.6–4.7)
Phosphorus: 5.3 MG/DL — ABNORMAL HIGH (ref 2.6–4.7)

## 2017-02-06 LAB — MAGNESIUM
Magnesium: 2.1 mg/dL (ref 1.6–2.4)
Magnesium: 1.9 mg/dL (ref 1.6–2.4)

## 2017-02-06 LAB — LIPASE: Lipase: 247 U/L (ref 73–393)

## 2017-02-06 LAB — PROTHROMBIN TIME + INR
INR: 1.2 — ABNORMAL HIGH (ref 0.9–1.1)
Prothrombin time: 11.7 s — ABNORMAL HIGH (ref 9.0–11.1)

## 2017-02-06 LAB — C. DIFFICILE AG & TOXIN A/B
C. difficile toxin: NEGATIVE
GDH ANTIGEN: NEGATIVE
INTERPRETATION: NEGATIVE

## 2017-02-06 LAB — SAMPLES BEING HELD

## 2017-02-06 LAB — NT-PRO BNP: NT pro-BNP: >35000 PG/ML — ABNORMAL HIGH (ref 0–125)

## 2017-02-06 LAB — EKG 12-LEAD
Atrial Rate: 88 {beats}/min
Diagnosis: NORMAL
P Axis: 55 degrees
P-R Interval: 202 ms
Q-T Interval: 404 ms
QRS Duration: 88 ms
QTc Calculation (Bazett): 488 ms
R Axis: -26 degrees
T Axis: 67 degrees
Ventricular Rate: 88 {beats}/min

## 2017-02-06 MED ORDER — VANCOMYCIN IN 0.9 % SODIUM CHLORIDE 2 GRAM/500 ML IV
2 gram/500 mL | INTRAVENOUS | Status: AC
Start: 2017-02-06 — End: 2017-02-06
  Administered 2017-02-06: 20:00:00 via INTRAVENOUS

## 2017-02-06 MED ORDER — ONDANSETRON 4 MG TAB, RAPID DISSOLVE
4 mg | ORAL | Status: AC
Start: 2017-02-06 — End: 2017-02-06
  Administered 2017-02-06: 20:00:00 via ORAL

## 2017-02-06 MED ORDER — ADV ADDAPTOR
3.375 gram | Status: AC
Start: 2017-02-06 — End: 2017-02-06
  Administered 2017-02-06: 20:00:00 via INTRAVENOUS

## 2017-02-06 MED ORDER — HEPARIN (PORCINE) 1,000 UNIT/ML IJ SOLN
1000 unit/mL | INTRAMUSCULAR | Status: DC | PRN
Start: 2017-02-06 — End: 2017-02-06

## 2017-02-06 MED FILL — VANCOMYCIN IN 0.9 % SODIUM CHLORIDE 2 GRAM/500 ML IV: 2 gram/500 mL | INTRAVENOUS | Qty: 500

## 2017-02-06 MED FILL — VANCOMYCIN ORAL SOLUTION 50 MG/ML CPD (RX COMPOUNDED): 50 mg/mL | ORAL | Qty: 5

## 2017-02-06 MED FILL — PIPERACILLIN-TAZOBACTAM 3.375 GRAM IV SOLR: 3.375 gram | INTRAVENOUS | Qty: 3.38

## 2017-02-06 MED FILL — ONDANSETRON 4 MG TAB, RAPID DISSOLVE: 4 mg | ORAL | Qty: 1

## 2017-02-06 MED FILL — HEPARIN (PORCINE) 1,000 UNIT/ML IJ SOLN: 1000 unit/mL | INTRAMUSCULAR | Qty: 5

## 2017-02-06 NOTE — Progress Notes (Addendum)
AMA  Patient requesting to leave. Got late day HD. He was upset by it. Further with elevated bp to >207 systolic after HD and calcium low. patietn understands risks for death, mva, cva, mi, etc should he leave at this time. He accepts all risks. I encouraged him to remain. He was not interested. He has left AMA. Attending made aware. Dc note per attending MD  Ned GraceAdam K Judithann Villamar, MD

## 2017-02-06 NOTE — ED Provider Notes (Addendum)
46 y.o. male with past medical history significant for CKD with HD MWF, HTN who presents ambulatory to the ED, with chief complaint of chest pain, abdominal pain and diarrhea. Pt complains of abdominal pain and diarrhea that started Thursday (02/03/17). Pt states that his abdominal pain is mostly lower, but notes that his upper abdomen is "tender". He reports that his abdomen feels "full of fluid" and notes that he receives a tap for ascites "every 2 weeks" at [facility]. States that his last tap was ~2 weeks ago. He also notes that he has had a liver biopsy in the past which was negative. Pt also reports that he has experienced diarrhea x 7-8 today. Notes that he had a similar number of episodes yesterday. He states that the diarrhea was accompanied by a "bad smell" and is worried that he may have a C. difficile infection, which he has had in the past. Pt also complains of chest pain that started ~0700-0800 this morning. Pt rates his current chest pain as 10/10 in intensity. Reports accompanying shortness of breath ("like I just ran a marathon") and b/l leg swelling. He also complains of a fever of "101 degrees" that started last night and was still present this morning. Pt notes that he is a dialysis patient and receives treatment MWF. States that his last treatment was Wednesday (02/02/17). He also notes that his RUE shunt became infected and uses a port in the left chest for dialysis. Pt states that he does not have a current nephrologist. Reports that he wants to "start over" in [place] and has the intention to contact a clinic but has not had a chance to call. Pt reports that he came to [facility] on Friday (02/04/17) and is staying at his sister's home. There are no other acute medical concerns at this time.    Social hx: Positive for Tobacco use (current everyday smoker); Negative for EtOH use; Negative for Illicit Drug use  PCP: Jeanell Sparrow, MD      Note written by Myrle Sheng, Scribe, as dictated by Daryll Brod, MD 3:05 PM       The history is provided by the patient and medical records. No language interpreter was used.        Past Medical History:   Diagnosis Date   ??? Chronic kidney disease    ??? Hypertension    ??? Infectious disease        History reviewed. No pertinent surgical history.      History reviewed. No pertinent family history.    Social History     Socioeconomic History   ??? Marital status: SINGLE     Spouse name: Not on file   ??? Number of children: Not on file   ??? Years of education: Not on file   ??? Highest education level: Not on file   Social Needs   ??? Financial resource strain: Not on file   ??? Food insecurity - worry: Not on file   ??? Food insecurity - inability: Not on file   ??? Transportation needs - medical: Not on file   ??? Transportation needs - non-medical: Not on file   Occupational History   ??? Not on file   Tobacco Use   ??? Smoking status: Current Every Day Smoker   ??? Smokeless tobacco: Never Used   Substance and Sexual Activity   ??? Alcohol use: No     Frequency: Never   ??? Drug use: No   ???  Sexual activity: Not Currently   Other Topics Concern   ??? Not on file   Social History Narrative   ??? Not on file         ALLERGIES: Lisinopril; Morphine; and Toradol [ketorolac]    Review of Systems   Constitutional: Positive for fever.   Respiratory: Positive for shortness of breath.    Cardiovascular: Positive for chest pain and leg swelling (Bilateral).   Gastrointestinal: Positive for abdominal pain (Mostly lower) and diarrhea.   All other systems reviewed and are negative.      Vitals:    02/06/17 1441   BP: (!) 212/118   Pulse: (!) 117   Resp: 18   Temp: 98.5 ??F (36.9 ??C)   SpO2: 99%   Weight: 105.7 kg (233 lb)   Height: 6\' 1"  (1.854 m)            Physical Exam   Constitutional: He is oriented to person, place, and time. He appears well-developed and well-nourished. No distress.    Uncomfortable appearing, AxOx4, speaking in complete sentences, on NC    L ant CW - noted dialysis cath; nttp   HENT:   Head: Normocephalic and atraumatic.   Mouth/Throat: Oropharynx is clear and moist. No oropharyngeal exudate.   Cn intact grossly   Eyes: Conjunctivae and EOM are normal. Pupils are equal, round, and reactive to light. Right eye exhibits no discharge. Left eye exhibits no discharge.   Neck: Normal range of motion. Neck supple.   Cardiovascular: Normal rate, regular rhythm, normal heart sounds and intact distal pulses. Exam reveals no gallop and no friction rub.   No murmur heard.  Pulmonary/Chest: Effort normal and breath sounds normal. No respiratory distress. He has no wheezes. He has no rales. He exhibits no tenderness.   Abdominal: Soft. Normal appearance and bowel sounds are normal. He exhibits shifting dullness, fluid wave and ascites. He exhibits no distension, no pulsatile liver, no abdominal bruit, no pulsatile midline mass and no mass. There is hepatomegaly. There is no hepatosplenomegaly. There is generalized tenderness. There is rigidity. There is no rebound, no guarding, no CVA tenderness, no tenderness at McBurney's point and negative Murphy's sign. No hernia. Hernia confirmed negative in the ventral area, confirmed negative in the right inguinal area and confirmed negative in the left inguinal area.   Musculoskeletal: Normal range of motion. He exhibits no edema.   RUE - noted shunt/ 'not being used because it got infected w/ c-diff';    Lymphadenopathy:     He has no cervical adenopathy.   Neurological: He is alert and oriented to person, place, and time. No cranial nerve deficit. Coordination normal.   Skin: Skin is warm and dry. No rash noted. No erythema.   Psychiatric: He has a normal mood and affect.   Nursing note and vitals reviewed.       MDM  Number of Diagnoses or Management Options  Fever, unspecified fever cause:   Risk of Complications, Morbidity, and/or Mortality   Presenting problems: high  Diagnostic procedures: high  Management options: high    Critical Care  Total time providing critical care: 30-74 minutes (55 min)    Patient Progress  Patient progress: stable         Procedures     Chief Complaint   Patient presents with   ??? Abdominal Pain   ??? Diarrhea       3:47 PM  The patients presenting problems have been discussed, and they are in agreement  with the care plan formulated and outlined with them.  I have encouraged them to ask questions as they arise throughout their visit.    MEDICATIONS GIVEN:  Medications   vancomycin (VANCOCIN) 2000 mg in NS 500 ml infusion (not administered)   piperacillin-tazobactam (ZOSYN) 3.375 g in 0.9% sodium chloride (MBP/ADV) 100 mL (not administered)   ondansetron (ZOFRAN ODT) tablet 4 mg (not administered)       LABS REVIEWED:  Labs Reviewed   CULTURE, BLOOD, PAIRED   URINE CULTURE HOLD SAMPLE   C. DIFFICILE AG & TOXIN A/B   CULTURE, BODY FLUID W GRAM STAIN   CBC WITH AUTOMATED DIFF   METABOLIC PANEL, COMPREHENSIVE   SAMPLES BEING HELD   TROPONIN I   MAGNESIUM   LIPASE   LACTIC ACID   PROTHROMBIN TIME + INR   PHOSPHORUS   URINALYSIS W/MICROSCOPIC   ALBUMIN, FLUID   CELL COUNT, BODY FLUID   PROTEIN TOTAL, FLUID   LACTIC ACID   PROTHROMBIN TIME + INR       RADIOLOGY RESULTS:  The following have been ordered and reviewed:  _____________________________________________________________________  _____________________________________________________________________    EKG interpretation:   Rhythm:  sinus tachycardia rhythm; and regular . Rate (approx.): 110; Axis: normal; P wave: normal; QRS interval: normal ; ST/T wave: normal; Negative acute significant segmental elevations/   PROCEDURES:        CONSULTATIONS:       PROGRESS NOTES:    DIAGNOSIS:    1. Fever, unspecified fever cause    2. Chest pain, unspecified type    3. Abdominal pain, generalized    4. Other ascites    5. Dialysis patient [facility](HCC)        PLAN:   1- sepsis/ unknown cause currently; re[ported fevers at home; will plan to start BS abx and admit;   2 CP -   3 abd pain - ? Swelling due to Ascites/ c-diff (no wbc count)  4 SOB - ascites vs needs dialysis;       ED COURSE: The patients hospital course has been uncomplicated.      CONSULT NOTE:  3:20 PM Joson Sapp, Carlisle BeersJohn L, MD spoke with Dr. Mackey BirchwoodLozina Daneva, MD, Consult for Nephrology.  Discussed available diagnostic tests and clinical findings.  Dr. Guss Bundeaneva notes that she saw the patient this morning. States that the patient left AMA. She reports that the patient does not need dialysis today, will receive dialysis tomorrow.     4:56 PM  Notified by nursing that pt states 'Im leaving now'; Pt refusing further interventions/ tx. Told to follow-up with PCP as discussed. Pt is medically capable of making an informed decision, was made aware of the risks of leaving AMA ( including disability, DEATH) and has repeated them to me verbally. Pt allowed to leave ama.

## 2017-02-06 NOTE — ED Triage Notes (Signed)
TRIAGE NOTE:  Patient states he is in town visiting his sister.  He suddenly got some diarrhea and abd pains.  History of C.diff last month.  He is a dialysis patient, m.w.f.      BP 212/118

## 2017-02-06 NOTE — ED Notes (Signed)
Pt left ambulatory with discharge instructions on his own.

## 2017-02-06 NOTE — Progress Notes (Signed)
Hospitalist Discharge Summary     Patient ID:  David [name]  [id number]  46 y.o.  1971/01/05    PCP on record: Other, Phys, MD    Admit date: 02/04/2017  Discharge date and time: 02/06/2017      DISCHARGE DIAGNOSIS:  See below    CONSULTATIONS:  IP CONSULT TO CARDIOLOGY  IP CONSULT TO GASTROENTEROLOGY    Excerpted HPI from H&P of David Hansen, MD:  David [name] is a 46 y.o.  African American male presents to ED with complaint noted above. Available records were reviewed at the time of H&P. Patient gave his name and DOB not as he has in the past - he is to be under David [name] DOB 2070-05-04 MRN: [id number] - registration is looking to correct.   ??  Patient with PMH for end-stage renal disease on hemodialysis Mondays, Wednesdays, Fridays, ascites, asthma, obesity, chronic hypoxic respiratory failure, hypertension, myocardial infarction, coronary artery disease, obstructive sleep apnea, C. diff colitis, spontaneous bacterial peritonitis just admitted and left AMA from [facility]. He has complex hx for the recurrence of cdiff failed therapy and was for stool transplant but left AMA. He now returns with x4 days of intermittent chest pressure/heaviness, missed HD, SOB and abdominal pain with intermittent diarrhea that occasionally is bloody in color. The chest heaviness "feels just like the time in 11/2016". He was at [facility] with similar presentation of abdominal pain, diarrhea - bloody, and chest pain seen by Cardiology Dr Oscar La. Found no evidence to support ACS. Further only found ascites. He was dialyzed. During work up he opted to leave AMA and therefore further work up was uanble to be completed. Patient did not go to HD over this week therefore he has missed >2 sessions. He feels like his weight is stable. Poor PO intake and n/v with diarrhea that occasionally is bloody in character, noncramping. No fevers. No sick contacts. In ED noted to have  mild increase in potassium to 5.8. No peaked T waves on ecg. Calcium found low at 5.9 corrects to 6.5 with albumin factored. No wbc. hgb low at 8. Vss. Trop 0.08 seems in line with his prior values from august.   No complaints of dizziness, lightheadedness, focal weakness, new onset of numbness, paresthesias, facial droop, headache, visual disturbance, neck pain, current shortness of breath, palpitations, calf pain and swelling, edema, rash, head or neck trauma or falls.   We were asked to see for work up and evaluation of the above problems.     ______________________________________________________________________  DISCHARGE SUMMARY/HOSPITAL COURSE:  for full details see H&P, daily progress notes, labs, consult notes.   Accelerated hypertension on benign essential hypertension - improving  Hyperkalemia, acute   Acute renal failure on Chronic kidney disease on HD, non compliant with HD  Recurrent CDIFF  Chest pressure  Recurrent abdominal pain, generalized  -admit to tele  -use labetalol IV prn sbp >170  -resume home cardiac medications - he has not been compliant  -possible etiology of worsened ARF: volume losses due to n/v and diarrhea. Further missed HD x multiple.  -will require HD in AM given he has missed >2 HD sessions in a row  -currently without changes in ECG at this time c/w hyperkalemia - will monitor carefully - if changes then will need to have emergent HD  -had very similar admission in 11/2016 at [facility] under Dr Oscar La for his chest pain presentation. Per Dr Oscar La note: "Chart reviewed. ??Multiple presentations for described symptom complex. ??Negative cardiac  studies and no objective findings. ??Will SOR."   -will follow troponin and have VCS see patient in consultation for following, but at this time do not see need for further work up this evening with recent work up during last admission for active chest pain being unyielding. Of course given his renal disease,??possible history for  CAD and stenting (looking for reports, not finding at thsi time), he is at risk for acute coronary syndrome. Regardless, in the setting of possible GI bleed (guaiac pos) and anemia we will not order asa/plavix.   -lipid panel pending  -npo after mn  -cdiff is an issue - has had >5 rounds of vancomycin/fidaxomycin or flagyl and all have failed. He has been set up for fecal transplant x multiple but has left AMA prior to all. Will have GI see and evaluate  -strict hand washing  -prior work up for similar/same abdominal pain yielded CT abd showing ascites, but??Consider for possible ascites??as prior. He remains afebrile, normal wbcThe patient is afebrile with normal white blood cell count and as such no convincing evidence for SBP (spontaneous bacterial peritonitis). Follow as above for now - repeat CT if post HD he is not improved.     10/20:  Pt is known to me from earlier in the year. Pt with long history of medication/HD noncompliance, as well as manipulative pain seeking behavior. Pt known to sign out AMA many times before.  Pt for HD today - appreciate Dr. Madalyn Robickman's help.  Pt's symptoms will likely subside after HD, as he has missed his previous sessions [last 3 is what he told me]. Informed by RN that pt was told that we needed stool sample for C. Diff testing and that he removed that mechanism from which we capture the stool. Advised pt that he needs to provide a stool sample or we cannot treat him appropriately - appreciate GI help.  No narcotics for pain. Will continue monitoring. Will repeat blood work. No IV abx for now. Can continue with oral Vanco for presumed C. Diff.  Pt with decision making capacity currently.    10/21:  As pt has done many times in the past - he left AMA after finishing HD.   ??  _______________________________________________________________________  Refer to progress note  _______________________________________________________________________  DISCHARGE MEDICATIONS:    Discharge Medication List as of 02/06/2017  1:00 AM          My Recommended Diet, Activity, Wound Care, and follow-up labs are listed in the patient's Discharge Insturctions which I have personally completed and reviewed.    ______________________________________________________________________    Risk of deterioration: High    Condition at Discharge:  Stable  ______________________________________________________________________    Disposition  Left AMA  ______________________________________________________________________    Care Plan discussed with:   Patient, Family, RN, Care Manager, Consultant    ______________________________________________________________________    Code Status: Full Code  ______________________________________________________________________      Follow up with:   PCP : Other, Phys, MD  Follow-up Information    None             Total time in minutes spent coordinating this discharge (includes going over instructions, follow-up, prescriptions, and preparing report for sign off to her PCP) :  15 minutes    Signed:  Earvin HansenSamip S Ladrea Holladay, MD

## 2017-02-06 NOTE — Progress Notes (Signed)
0025: Pt just finished with dialysis, HD nurse still at bedside. Pt asking for primary RN. Pt requesting to sign AMA form. RN informed charge nurse, nursing supervisor and in house MD (Dr. Jamison NeighborGelrud) that pt wants to leave AMA. RN informed pt that his BP is elevated and other issues are still being addressed. Pt states "I am leaving, please get me the form to sign." Telemetry removed, PIV removed.     [id number]: Dr. Jamison NeighborGelrud speaking with pt at this time. Pt still insists he is leaving. AMA form signed by pt, MD and RN. Pt requesting to be wheeled down to ER where his car is parked.

## 2017-02-07 ENCOUNTER — Inpatient Hospital Stay
Admit: 2017-02-07 | Discharge: 2017-02-08 | Discharge status: Against Medical Advice | Payer: MEDICARE | Attending: Internal Medicine | Admitting: Internal Medicine

## 2017-02-07 ENCOUNTER — Emergency Department: Admit: 2017-02-07 | Payer: MEDICARE

## 2017-02-07 ENCOUNTER — Inpatient Hospital Stay

## 2017-02-07 DIAGNOSIS — I161 Hypertensive emergency: Secondary | ICD-10-CM

## 2017-02-07 LAB — METABOLIC PANEL, COMPREHENSIVE
A-G Ratio: 0.7 — ABNORMAL LOW (ref 1.1–2.2)
ALT (SGPT): 17 U/L (ref 12–78)
AST (SGOT): 24 U/L (ref 15–37)
Albumin: 2.7 g/dL — ABNORMAL LOW (ref 3.5–5.0)
Alk. phosphatase: 245 U/L — ABNORMAL HIGH (ref 45–117)
Anion gap: 17 mmol/L — ABNORMAL HIGH (ref 5–15)
BUN/Creatinine ratio: 5 — ABNORMAL LOW (ref 12–20)
BUN: 64 MG/DL — ABNORMAL HIGH (ref 6–20)
Bilirubin, total: 0.4 MG/DL (ref 0.2–1.0)
CO2: 19 mmol/L — ABNORMAL LOW (ref 21–32)
Calcium: 6 MG/DL — CL (ref 8.5–10.1)
Chloride: 105 mmol/L (ref 97–108)
Creatinine: 12.91 MG/DL — ABNORMAL HIGH (ref 0.70–1.30)
GFR est AA: 5 mL/min/{1.73_m2} — ABNORMAL LOW (ref >60)
GFR est non-AA: 4 mL/min/{1.73_m2} — ABNORMAL LOW (ref >60)
Globulin: 3.9 g/dL (ref 2.0–4.0)
Glucose: 105 mg/dL — ABNORMAL HIGH (ref 65–100)
Potassium: 5.3 mmol/L — ABNORMAL HIGH (ref 3.5–5.1)
Protein, total: 6.6 g/dL (ref 6.4–8.2)
Sodium: 141 mmol/L (ref 136–145)

## 2017-02-07 LAB — C. DIFFICILE AG & TOXIN A/B
C. difficile toxin: NEGATIVE
GDH ANTIGEN: NEGATIVE
INTERPRETATION: NEGATIVE

## 2017-02-07 LAB — EKG, 12 LEAD, INITIAL
Atrial Rate: 108 {beats}/min
Calculated P Axis: 54 degrees
Calculated R Axis: -12 degrees
Calculated T Axis: 92 degrees
P-R Interval: 180 ms
Q-T Interval: 336 ms
QRS Duration: 88 ms
QTC Calculation (Bezet): 450 ms
Ventricular Rate: 108 {beats}/min

## 2017-02-07 LAB — PTT: aPTT: 33.9 s — ABNORMAL HIGH (ref 22.1–32.0)

## 2017-02-07 LAB — CULTURE, STOOL
Campylobacter antigen: NEGATIVE
Shiga toxin-producing E. coli Ag: NEGATIVE

## 2017-02-07 LAB — NT-PRO BNP: NT pro-BNP: >35000 PG/ML — ABNORMAL HIGH (ref 0–125)

## 2017-02-07 LAB — PROTHROMBIN TIME + INR
INR: 1.1 (ref 0.9–1.1)
Prothrombin time: 11.3 s — ABNORMAL HIGH (ref 9.0–11.1)

## 2017-02-07 LAB — TROPONIN I: Troponin-I, Qt.: 0.12 ng/mL — ABNORMAL HIGH (ref <0.05)

## 2017-02-07 LAB — WBC, STOOL: White blood cells, stool: 0 /HPF (ref 0–4)

## 2017-02-07 LAB — EKG 12-LEAD
Atrial Rate: 108 {beats}/min
P Axis: 54 degrees
P-R Interval: 180 ms
Q-T Interval: 336 ms
QRS Duration: 88 ms
QTc Calculation (Bazett): 450 ms
R Axis: -12 degrees
T Axis: 92 degrees
Ventricular Rate: 108 {beats}/min

## 2017-02-07 MED ORDER — ONDANSETRON (PF) 4 MG/2 ML INJECTION
4 mg/2 mL | INTRAMUSCULAR | Status: AC
Start: 2017-02-07 — End: 2017-02-07
  Administered 2017-02-08: via INTRAVENOUS

## 2017-02-07 NOTE — Progress Notes (Addendum)
[facility]: MED RECONCILIATION    Comments/Recommendations:   ?? Patient reports compliance usually with the medications reported below, but has not taken anything (except albuterol inhaler) since Friday d/t illness. [place] moving to [place] and is currently staying with his sister.   ?? Patient gets HD MWF, however reports no HD since last Wednesday  ?? He does not have a pharmacy established in [place] yet but [facility] new scripts.     Information obtained from: patient, no Rx query on file    Significant PMH/Disease States:   Past Medical History:   Diagnosis Date   ??? Chronic kidney disease    ??? Hypertension    ??? Infectious disease      Chief Complaint for this Admission:   Chief Complaint   Patient presents with   ??? Chest Pain   ??? Shortness of Breath     Allergies: Lisinopril; Morphine; and Toradol [ketorolac]    Prior to Admission Medications:     Medication Documentation Review Audit       Reviewed by Cathrine MusterFerguson, Brad L, PHARMD (Pharmacist) on 02/07/17 at 2051      Medication Sig Documenting Provider Last Dose Status Taking?   albuterol (PROVENTIL HFA, VENTOLIN HFA, PROAIR HFA) 90 mcg/actuation inhaler Take 2 Puffs by inhalation every four (4) hours as needed for Wheezing. Provider, Historical 02/07/2017 Unknown time Active Yes   amLODIPine (NORVASC) 10 mg tablet Take 10 mg by mouth daily. Provider, Historical 02/04/2017 am Active Yes   carvedilol (COREG) 12.5 mg tablet Take 12.5 mg by mouth two (2) times daily (with meals). Provider, Historical 02/04/2017 pm Active Yes   furosemide (LASIX) 80 mg tablet Take 80 mg by mouth daily. Provider, Historical 02/04/2017 am Active Yes   hydrALAZINE (APRESOLINE) 100 mg tablet Take 100 mg by mouth three (3) times daily. Provider, Historical 02/04/2017 pm Active Yes   sevelamer (RENAGEL) 400 mg tablet Take 800 mg by mouth three (3) times daily (with meals). Provider, Historical 02/04/2017 dinner Active Yes                   Thank you for the consult,  Brad L. Bradly ChrisFerguson, Pharm D, [place]  [phone]'

## 2017-02-07 NOTE — H&P (Signed)
[facility]  [address] David [name] [place], [place]  [address]  [phone]  Admission History and Physical      NAME:  David [name]   DOB:   12/01/1969   MRN:  [id number]     PCP:  Jeanell Sparrow, [place]     Date/Time:  02/07/2017         Subjective:     CHIEF COMPLAINT: n/v/d     HISTORY OF PRESENT ILLNESS:     [name] is a 46 y.o. with h/o esrd, htn, cad who presents with n/v/d/chest pain. Pt lived in [place] until last Friday when he decided to move to [place] to live with his sister. He is normally on HD MWF, his last session was Wednesday. He came to [facility] er yesterday but left AMA. Today he returns due to intermittent n/v/d. He is convinced he has Cdiff however the cdiff sent by the ER yesterday was negative. He also notes chest pain intermittent starting yesterday and now constant today. He reports central pressure and feels like someone is sitting on his chest. He has h/o cad and has two stents in place. He has not been taking his bp medications lately. 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     Past Medical History:   Diagnosis Date   ??? Chronic kidney disease    ??? Hypertension    ??? Infectious disease         No past surgical history on file.    Social History     Tobacco Use   ??? Smoking status: Current Every Day Smoker   ??? Smokeless tobacco: Never Used   Substance Use Topics   ??? Alcohol use: No     Frequency: Never        Family history  htn     Allergies   Allergen Reactions   ??? Lisinopril Anaphylaxis   ??? Morphine Anaphylaxis   ??? Toradol [Ketorolac] Anaphylaxis        Prior to Admission medications    Medication Sig Start Date End Date Taking? Authorizing Provider   hydrALAZINE (APRESOLINE) 100 mg tablet Take 100 mg by mouth three (3) times daily.   Yes Provider, Historical   amLODIPine (NORVASC) 10 mg tablet Take 10 mg by mouth daily.   Yes Provider, Historical   carvedilol (COREG) 12.5 mg tablet Take 12.5 mg by mouth two (2) times  daily (with meals).   Yes Provider, Historical   furosemide (LASIX) 80 mg tablet Take 80 mg by mouth daily.   Yes Provider, Historical   sevelamer (RENAGEL) 400 mg tablet Take 800 mg by mouth three (3) times daily (with meals).   Yes Provider, Historical   albuterol (PROVENTIL HFA, VENTOLIN HFA, PROAIR HFA) 90 mcg/actuation inhaler Take 2 Puffs by inhalation every four (4) hours as needed for Wheezing.   Yes Provider, Historical         Review of Systems:       Gen:  Eyes:  ENT:  CVS:  chest painPulm:  GI:  nausea, emesis, diarrhea  GU:    MS:  Skin:  Psych:  Endo:    Hem:  Renal:    Neuro:            Objective:      VITALS:    Vital signs reviewed; most recent are:    Visit Vitals  BP (!) 210/116   Pulse 100  Temp 98.2 ??F (36.8 ??C)   Resp 17   Ht 6\' 1"  (1.854 m)   Wt 105.7 kg (233 lb)   SpO2 100%   BMI 30.74 kg/m??     SpO2 Readings from Last 6 Encounters:   02/07/17 100%   02/06/17 99%    O2 Flow Rate (L/min): 4 l/min   No intake or output data in the 24 hours ending 02/07/17 2154         Exam:     Physical Exam:    Gen:  Well-developed, well-nourished, in no acute distress  HEENT:  Pink conjunctivae, PERRL, hearing intact to voice, moist mucous membranes  Neck:  Supple, without masses, thyroid non-tender  Resp:  No accessory muscle use, clear breath sounds without wheezes rales or rhonchi  Card:  No murmurs, normal S1, S2 without thrills, bruits. 3+ peripheral edema  Abd:  Soft, mild TTP LLQ, distended with +fluid wave, normoactive bowel sounds are present, no palpable organomegaly  Lymph:  No cervical adenopathy  Musc:  No cyanosis or clubbing  Skin:  No rashes or ulcers, skin turgor is good  Neuro:  Cranial nerves 3-12 are grossly intact, grip strength is 5/5 bilaterally, dorsi / plantarflexion strength is 5/5 bilaterally, follows commands appropriately  Psych:  Alert with good insight.  Oriented to person, place, and time       Labs:    Recent Labs     02/07/17  1914   WBC 5.9   HGB 8.2*   HCT 26.1*    PLT 152     Recent Labs     02/07/17  1914 02/06/17  1531   NA 141 142   K 5.3* 4.6   CL 105 107   CO2 19* 20*   GLU 105* 107*   BUN 64* 58*   CREA 12.91* 11.70*   CA 6.0* 5.9*   MG  --  1.9   PHOS  --  5.3*   ALB 2.7* 2.5*   SGOT 24 16   ALT 17 13     No components found for: GLPOC  No results for input(s): PH, PCO2, PO2, HCO3, FIO2 in the last 72 hours.  Recent Labs     02/07/17  1914   INR 1.1       Chest Xray:  No acute process  --images visualized personally  EKG reviewed:   NSR, no ST Twave changes       Assessment/Plan:       Hypertensive emergency: elevated due to missed medications and missed HD. As pt is having ongoing chest pain will start nitro gtt. Order now doses of home hydralazine, coreg and norvasc. Will have goal BP reduction of 20% in the next 24 hours      Chest pain / CAD (coronary artery disease): no e/o acute ischemia on EKG. TnI mildly elevated; which is non specific in setting of ESRD. Start nitro gtt as above. Start aspirin, give dose now. Resume coreg. Send lipid panel. Order echo. Consult cardiology. NPO at [place] in case stress test vs cath is warranted    Nausea / Vomiting / Diarrhea: n/v could be 2/2 elevated BP. Start tx as above with anti-emetics. CT abd notes no colitis. Send stool studies. Cdiff negative that was sent by ER yesterday.       ESRD (end stage renal disease): usually MWF. Last HD session was Wednesday. Consult nephrology      Ascites: unclear etiology. Per pt he had work up at  hospital in [place] which included liver biopsy which was 'negative' per his report. Consider diagnostic/therapeutic tap once BP improved and HD is up to date    Chronic hypoxic respiratory failure: unclear cause, unknown per patient. ?copd vs asthma? On baseline 4L    COPD: without exacerbation. No home meds noted. Will order prn nebs for now    Anemia: suspect chronic disease. May benefit from epo. 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 He now has HD catheter. Blood cultures sent yesterday by ER. Will follow       Total time spent with patient: 71 Minutes Critical care                 Care Plan discussed with: Patient    Discussed:  Care Plan    Prophylaxis:  Hep SQ    Probable Disposition:  Home w/Family           ___________________________________________________    Attending Physician: Worthy Rancher, MD

## 2017-02-07 NOTE — ED Provider Notes (Addendum)
46 y.o. male with past medical history significant for HTN, ESRD on HD MWF, h/o C diff, presents to the ED with chief complaint of worsening chest pain, abdominal pain, and diarrhea since prior evaluation in the [facility] ED yesterday, 02/06/17. Patient states that he is in the process of moving from [place] to the greater [place] area. He used to receive all of his medical care at [facility] in [place], [place], but does not have a local nephrologist or PCP. Patient reports that he felt the onset of abdominal pain and diarrhea on Thursday 10/18, and then felt the onset of chest pain yesterday 10/21. He was seen in the [facility] ED yesterday for his complaints. Patient states that the provider yesterday "wanted to admit me, but I left AMA because I'm pigheaded". He returns to the ED today because his symptoms have become worse. The chest pain has been constant since yesterday, feels "like someone is sitting on my chest", and is now a 10/10 in severity. The abdominal pain is located diffusely throughout his abdomen and feels like a "tension". He associates the abdominal pain with abdominal distention, which he attributes to ascites. States that he has a history of similar ascites in the past which requires paracentesis "every two weeks". Patient reports that he has had a liver biopsy performed in the past which was negative. He additionally complains of continued diarrhea, 7-8 episodes per day, which is watery and malodorous in nature. States that he has had C diff on multiple occasions in the past, and has been told that he will soon require a fecal transplant. He feels like he may have C diff once again, and he reports that his last round of abx was finished last month to treat pna. Patient also reports associated nausea, vomiting, inability to tolerate PO intake, shortness of breath, and fever last night with Tmax of 101F. He states that he was last fully dialyzed on  Wednesday 02/02/17 in [place], but he missed dialysis sessions both on Friday and today. There are no other acute medical concerns at this time.    Social hx: Positive Tobacco use (current every day smoker); Denies EtOH use; Denies Illicit Drug Abuse    PCP: Jeanell Sparrow, [place]    Note written by Guadlupe Spanish.Ravensbergen, Scribe, as dictated by Christella Noa, MD 7:22 PM       The history is provided by the patient and medical records. No language interpreter was used.        Past Medical History:   Diagnosis Date   ??? Chronic kidney disease    ??? Hypertension    ??? Infectious disease        No past surgical history on file.      No family history on file.    Social History     Socioeconomic History   ??? Marital status: SINGLE     Spouse name: Not on file   ??? Number of children: Not on file   ??? Years of education: Not on file   ??? Highest education level: Not on file   Social Needs   ??? Financial resource strain: Not on file   ??? Food insecurity - worry: Not on file   ??? Food insecurity - inability: Not on file   ??? Transportation needs - medical: Not on file   ??? Transportation needs - non-medical: Not on file   Occupational History   ??? Not on file   Tobacco Use   ??? Smoking  status: Current Every Day Smoker   ??? Smokeless tobacco: Never Used   Substance and Sexual Activity   ??? Alcohol use: No     Frequency: Never   ??? Drug use: No   ??? Sexual activity: Not Currently   Other Topics Concern   ??? Not on file   Social History Narrative   ??? Not on file         ALLERGIES: Lisinopril; Morphine; and Toradol [ketorolac]    Review of Systems   Constitutional: Positive for fever.   Respiratory: Positive for shortness of breath.    Cardiovascular: Positive for chest pain.   Gastrointestinal: Positive for abdominal distention, abdominal pain, diarrhea, nausea and vomiting.   All other systems reviewed and are negative.      Vitals:    02/07/17 1903 02/07/17 1930   BP: (!) 199/120 (!) 204/116   Pulse: (!) 105 (!) 102   Resp:  18    Temp: 98.2 ??F (36.8 ??C)    SpO2: 100% 100%   Weight: 105.7 kg (233 lb)    Height: 6\' 1"  (1.854 m)             Physical Exam   Constitutional: He appears well-developed and well-nourished.   Patient is chronically ill appearing and debilitated.    HENT:   Head: Normocephalic and atraumatic.   Eyes: Conjunctivae are normal. No scleral icterus.   Neck: No JVD present. No tracheal deviation present.   Cardiovascular: Normal rate.   Pulmonary/Chest: Effort normal.   Port noted in the left subclavian.   Abdominal: He exhibits distension. There is tenderness. There is no rebound and no guarding.   Musculoskeletal: He exhibits no edema.    2+ peripheral edema present in the BLE.   Neurological: He is alert.   oriented   Skin: No rash noted. No pallor.   Psychiatric: He has a normal mood and affect.   Nursing note and vitals reviewed.  Note written by Guadlupe Spanish.Ravensbergen, Scribe, as dictated by Christella Noa, MD 7:22 PM     MDM  Number of Diagnoses or Management Options     Amount and/or Complexity of Data Reviewed  Clinical lab tests: ordered and reviewed  Tests in the radiology section of CPT??: ordered and reviewed  Tests in the medicine section of CPT??: ordered and reviewed  Discussion of test results with the performing providers: yes  Obtain history from someone other than the patient: yes  Discuss the patient with other providers: yes           Procedures    ED EKG interpretation:  Rhythm: sinus tachycardia; and regular . Rate (approx.): 104 bpm; Axis: normal; ST/T wave: normal. Possible left atrial enlargement. Borderline EKG.   Note written by Guadlupe Spanish.Ravensbergen, Scribe, as dictated by Christella Noa, MD 7:20 PM     PROGRESS NOTE:  8:17 PM  Patient has acute chest pain with elevated troponin. Also no dialysis for five days and calcium is low. Marked ascites noted on CT. Will admit to hospitalist service for further work-up.     8:22 PM   Patient is being admitted to the hospital.  The results of their tests and reasons for their admission have been discussed with them and/or available family.  They convey agreement and understanding for the need to be admitted and for their admission diagnosis.  Consultation will be made now with the inpatient physician for hospitalization.     CONSULT NOTE:  8:24 PM Leonette Most  Oretha Caprice Tierre Gerard, MD spoke with Dr. Rubbie BattiestBridge, Consult for Hospitalist.  Discussed available diagnostic tests and clinical findings.  Dr. Rubbie BattiestBridge will evaluate the patient for admission to the hospital.       Total critical care time spent exclusive of procedures: 34   minutes

## 2017-02-07 NOTE — ED Notes (Signed)
Pt stated he has not been able to keep his blood pressure medication down since Friday.

## 2017-02-07 NOTE — ED Notes (Signed)
Verbal report given to Juma, RN who will assume care of the patient at this time

## 2017-02-07 NOTE — ED Notes (Signed)
Patient is refusing IV sticks, states he would like EJ access. Also refusing all PO medications. Spoke with Dr. Rubbie BattiestBridge, states to request ER physician to place and EJ.

## 2017-02-07 NOTE — ED Triage Notes (Signed)
Pt c/o chest pain since today while he was watching tv.  Pt also c/o SOB. Pt has not been to dialysis since Wednesday d/t n/d. H/o c diff. Pt abd is very distended.

## 2017-02-07 NOTE — Progress Notes (Signed)
02/07/2017  10:09 PM    Reason for Admission:   Chest pain                   RRAT Score:      7               Plan for utilizing home health:      Not at this time                    Likelihood of Readmission:  Low                         Transition of Care Plan:      EMR reviewed.  Patient states [place] on [place] and has been staying with his sister, who lives in a 1 level home with 2 entry steps [place] (patient did not know exact address).  Patient states at discharge he will likely seek to stay at a hotel until he is able to find housing for himself.  Patient denied history of HH/inpatient rehab and reports receiving dialysis MWF (last received on 10/17).  He utilizes a 4L oxygen condenser "PRN, at night, all the time."  Patient also requested a CPAP and CM educated that the MD would need to place a consult request.  Patient states he prefers "[facility]" and [facility] [address].  His code status is full and he confirmed he had an advance directive at home and did not wish to complete a new one.  CM provided patient with [facility] list, [facility], [employer] to obtain his DD214, information on how to apply for SSDI, per his request, and the contact information for [employer] to apply for food stamps.  Patient is self-pay, stating he will likely "wipe out my savings" with this stay, so he is seeking additional sources of income.  Veteran will need a referral for dialysis prior to discharge.  No further CM needs identified at this time.  CM will continue to follow.               Care Management Interventions  PCP Verified by CM: Yes(provided [facility] PCP as [place] is [place] from  [place]; No NN)  Last Visit to PCP: 02/06/16  Mode of Transport at Discharge: Self(personal vehicle)  Discharge Durable Medical Equipment: No  Physical Therapy Consult: No  Occupational Therapy Consult: No  Speech Therapy Consult: No   Current Support Network: Relative's Home  Confirm Follow Up Transport: [facility]  Veteran Resource Information Provided?: Yes([employer] to acquire DD-214; education on [place] services; offered to be transferred but declined)  Discharge Location  Discharge Placement: Unable to determine at this time

## 2017-02-07 NOTE — Consults (Signed)
Please see admission history and physical.

## 2017-02-08 LAB — EKG, 12 LEAD, INITIAL
Atrial Rate: 104 {beats}/min
Calculated P Axis: 65 degrees
Calculated R Axis: -23 degrees
Calculated T Axis: 89 degrees
P-R Interval: 184 ms
Q-T Interval: 372 ms
QRS Duration: 84 ms
QTC Calculation (Bezet): 489 ms
Ventricular Rate: 104 {beats}/min

## 2017-02-08 LAB — CBC WITH AUTOMATED DIFF
ABS. BASOPHILS: 0 10*3/uL (ref 0.0–0.1)
ABS. EOSINOPHILS: 0.4 10*3/uL (ref 0.0–0.4)
ABS. IMM. GRANS.: 0 10*3/uL (ref 0.00–0.04)
ABS. LYMPHOCYTES: 0.4 10*3/uL — ABNORMAL LOW (ref 0.8–3.5)
ABS. MONOCYTES: 0.5 10*3/uL (ref 0.0–1.0)
ABS. NEUTROPHILS: 4.6 10*3/uL (ref 1.8–8.0)
ABSOLUTE NRBC: 0 10*3/uL (ref 0.00–0.01)
BASOPHILS: 0 % (ref 0–1)
EOSINOPHILS: 6 % (ref 0–7)
HCT: 26.1 % — ABNORMAL LOW (ref 36.6–50.3)
HGB: 8.2 g/dL — ABNORMAL LOW (ref 12.1–17.0)
IMMATURE GRANULOCYTES: 0 % (ref 0.0–0.5)
LYMPHOCYTES: 6 % — ABNORMAL LOW (ref 12–49)
MCH: 29.9 PG (ref 26.0–34.0)
MCHC: 31.4 g/dL (ref 30.0–36.5)
MCV: 95.3 FL (ref 80.0–99.0)
MONOCYTES: 9 % (ref 5–13)
MPV: 9.5 FL (ref 8.9–12.9)
NEUTROPHILS: 79 % — ABNORMAL HIGH (ref 32–75)
NRBC: 0 PER 100 WBC
PLATELET: 152 10*3/uL (ref 150–400)
RBC: 2.74 M/uL — ABNORMAL LOW (ref 4.10–5.70)
RDW: 16.5 % — ABNORMAL HIGH (ref 11.5–14.5)
WBC: 5.9 10*3/uL (ref 4.1–11.1)

## 2017-02-08 LAB — HEMOGLOBIN A1C WITH EAG: Hemoglobin A1c: <3.5 % — ABNORMAL LOW (ref 4.2–6.3)

## 2017-02-08 LAB — IRON PROFILE
Iron % saturation: 59 % — ABNORMAL HIGH (ref 20–50)
Iron: 131 ug/dL (ref 35–150)
TIBC: 221 ug/dL — ABNORMAL LOW (ref 250–450)

## 2017-02-08 LAB — CK W/ REFLX CKMB: CK: 2128 U/L — ABNORMAL HIGH (ref 39–308)

## 2017-02-08 LAB — HEPATITIS BE AG: Hepatitis Be Antigen: NEGATIVE

## 2017-02-08 LAB — FERRITIN: Ferritin: 1565 NG/ML — ABNORMAL HIGH (ref 26–388)

## 2017-02-08 LAB — CK-MB,QUANT.
CK - MB: 32.2 NG/ML — ABNORMAL HIGH (ref <3.6)
CK-MB Index: 1.5 (ref 0–2.5)

## 2017-02-08 LAB — EKG 12-LEAD
Atrial Rate: 104 {beats}/min
P Axis: 65 degrees
P-R Interval: 184 ms
Q-T Interval: 372 ms
QRS Duration: 84 ms
QTc Calculation (Bazett): 489 ms
R Axis: -23 degrees
T Axis: 89 degrees
Ventricular Rate: 104 {beats}/min

## 2017-02-08 MED ORDER — OXYCODONE-ACETAMINOPHEN 5 MG-325 MG TAB
5-325 mg | ORAL | Status: DC
Start: 2017-02-08 — End: 2017-02-07
  Administered 2017-02-08: via ORAL

## 2017-02-08 MED ORDER — NITROGLYCERIN 2 % TRANSDERMAL OINTMENT
2 % | Freq: Two times a day (BID) | TRANSDERMAL | Status: DC
Start: 2017-02-08 — End: 2017-02-07
  Administered 2017-02-08: 01:00:00 via TOPICAL

## 2017-02-08 MED ORDER — ASPIRIN 81 MG CHEWABLE TAB
81 mg | Freq: Every day | ORAL | Status: DC
Start: 2017-02-08 — End: 2017-02-08

## 2017-02-08 MED ORDER — HYDROMORPHONE (PF) 2 MG/ML IJ SOLN
2 mg/mL | Freq: Once | INTRAMUSCULAR | Status: AC
Start: 2017-02-08 — End: 2017-02-07
  Administered 2017-02-08: 01:00:00 via INTRAVENOUS

## 2017-02-08 MED ORDER — FUROSEMIDE 40 MG TAB
40 mg | Freq: Every day | ORAL | Status: DC
Start: 2017-02-08 — End: 2017-02-08

## 2017-02-08 MED ORDER — ASPIRIN 325 MG TAB
325 mg | Freq: Once | ORAL | Status: DC
Start: 2017-02-08 — End: 2017-02-08

## 2017-02-08 MED ORDER — NICARDIPINE 2.5 MG/ML IV
25 mg/10 mL | INTRAVENOUS | Status: DC
Start: 2017-02-08 — End: 2017-02-08

## 2017-02-08 MED ORDER — HYDROMORPHONE (PF) 2 MG/ML IJ SOLN
2 mg/mL | INTRAMUSCULAR | Status: DC | PRN
Start: 2017-02-08 — End: 2017-02-08
  Administered 2017-02-08 (×2): via INTRAVENOUS

## 2017-02-08 MED ORDER — HYDRALAZINE 25 MG TAB
25 mg | Freq: Three times a day (TID) | ORAL | Status: DC
Start: 2017-02-08 — End: 2017-02-08

## 2017-02-08 MED ORDER — SODIUM CHLORIDE 0.9 % IJ SYRG
Freq: Three times a day (TID) | INTRAMUSCULAR | Status: DC
Start: 2017-02-08 — End: 2017-02-08
  Administered 2017-02-08: 01:00:00 via INTRAVENOUS

## 2017-02-08 MED ORDER — SEVELAMER CARBONATE 800 MG TAB
800 mg | Freq: Three times a day (TID) | ORAL | Status: DC
Start: 2017-02-08 — End: 2017-02-08

## 2017-02-08 MED ORDER — NITROGLYCERIN IN D5W 200 MCG/ML IV
50 mg/2 mL (200 mcg/mL) | INTRAVENOUS | Status: DC
Start: 2017-02-08 — End: 2017-02-08
  Administered 2017-02-08 (×3): via INTRAVENOUS

## 2017-02-08 MED ORDER — ACETAMINOPHEN 325 MG TABLET
325 mg | ORAL | Status: DC | PRN
Start: 2017-02-08 — End: 2017-02-08

## 2017-02-08 MED ORDER — CALCIUM GLUCONATE 100 MG/ML (10%) IV SOLN
10010 mg/mL (10%) | Freq: Once | INTRAVENOUS | Status: AC
Start: 2017-02-08 — End: 2017-02-07
  Administered 2017-02-08: 03:00:00 via INTRAVENOUS

## 2017-02-08 MED ORDER — CARVEDILOL 12.5 MG TAB
12.5 mg | Freq: Two times a day (BID) | ORAL | Status: DC
Start: 2017-02-08 — End: 2017-02-08

## 2017-02-08 MED ORDER — HEPARIN (PORCINE) 5,000 UNIT/ML IJ SOLN
5000 unit/mL | Freq: Three times a day (TID) | INTRAMUSCULAR | Status: DC
Start: 2017-02-08 — End: 2017-02-08

## 2017-02-08 MED ORDER — AMLODIPINE 5 MG TAB
5 mg | Freq: Every day | ORAL | Status: DC
Start: 2017-02-08 — End: 2017-02-08

## 2017-02-08 MED ORDER — ONDANSETRON (PF) 4 MG/2 ML INJECTION
4 mg/2 mL | INTRAMUSCULAR | Status: DC | PRN
Start: 2017-02-08 — End: 2017-02-08
  Administered 2017-02-08: 04:00:00 via INTRAVENOUS

## 2017-02-08 MED ORDER — SODIUM CHLORIDE 0.9 % IJ SYRG
INTRAMUSCULAR | Status: DC | PRN
Start: 2017-02-08 — End: 2017-02-08

## 2017-02-08 MED ORDER — IPRATROPIUM-ALBUTEROL 2.5 MG-0.5 MG/3 ML NEB SOLUTION
2.5 mg-0.5 mg/3 ml | Freq: Four times a day (QID) | RESPIRATORY_TRACT | Status: DC | PRN
Start: 2017-02-08 — End: 2017-02-08

## 2017-02-08 MED FILL — NICARDIPINE 2.5 MG/ML IV: 25 mg/10 mL | INTRAVENOUS | Qty: 10

## 2017-02-08 MED FILL — NITRO-BID 2 % TRANSDERMAL OINTMENT: 2 % | TRANSDERMAL | Qty: 1

## 2017-02-08 MED FILL — ONDANSETRON (PF) 4 MG/2 ML INJECTION: 4 mg/2 mL | INTRAMUSCULAR | Qty: 2

## 2017-02-08 MED FILL — HYDROMORPHONE (PF) 2 MG/ML IJ SOLN: 2 mg/mL | INTRAMUSCULAR | Qty: 1

## 2017-02-08 MED FILL — HEPARIN (PORCINE) 5,000 UNIT/ML IJ SOLN: 5000 unit/mL | INTRAMUSCULAR | Qty: 1

## 2017-02-08 MED FILL — CARVEDILOL 12.5 MG TAB: 12.5 mg | ORAL | Qty: 1

## 2017-02-08 MED FILL — HYDRALAZINE 25 MG TAB: 25 mg | ORAL | Qty: 4

## 2017-02-08 MED FILL — NITROGLYCERIN IN D5W 200 MCG/ML IV: 50 mg/2 mL (200 mcg/mL) | INTRAVENOUS | Qty: 250

## 2017-02-08 MED FILL — BD POSIFLUSH NORMAL SALINE 0.9 % INJECTION SYRINGE: INTRAMUSCULAR | Qty: 10

## 2017-02-08 MED FILL — AMLODIPINE 5 MG TAB: 5 mg | ORAL | Qty: 2

## 2017-02-08 MED FILL — CALCIUM GLUCONATE 100 MG/ML (10%) IV SOLN: 100 mg/mL (10%) | INTRAVENOUS | Qty: 20

## 2017-02-08 MED FILL — SEVELAMER CARBONATE 800 MG TAB: 800 mg | ORAL | Qty: 1

## 2017-02-08 NOTE — ED Notes (Signed)
Spoke with Dr. Rubbie BattiestBridge in regard to patient's blood pressure and chest pain. Dr. Rubbie BattiestBridge states she will order and additional drip to be started in the ICU.

## 2017-02-08 NOTE — ED Notes (Signed)
Admission Time Out  Check signed & held orders:  [x]  Yes or  []  No     Assess Vital Signs over the last 2 hours:  [x]  Stable or  []  Unstable       Patient Vitals for the past 4 hrs:   Pulse Resp BP SpO2   02/08/17 0000 ??? ??? (!) 202/112 ???   02/07/17 2330 98 17 (!) 183/130 100 %   02/07/17 2310 (!) 111 23 (!) 183/115 100 %   02/07/17 2300 99 20 (!) 189/115 100 %   02/07/17 2250 100 19 (!) 197/112 100 %   02/07/17 2240 (!) 102 17 (!) 189/119 99 %   02/07/17 2231 (!) 101 24 (!) 184/111 100 %   02/07/17 2200 (!) 103 16 (!) 179/112 99 %   02/07/17 2150 100 21 (!) 207/96 99 %   02/07/17 2130 100 17 (!) 210/116 100 %   02/07/17 2100 (!) 101 ??? (!) 159/103 ???   02/07/17 2030 98 21 (!) 159/103 100 %       Does this patient meet Code Purple criteria or other Core Measure protocol?      []  Yes or  [x]  No or  []  Already being treated     Unit patient assigned to: ICU                 Does the patient need any special isolation?   [x]   Yes or  []  No    Is the assigned unit appropriate?  [x]  Yes or  []  No    Does the patient need to be transported on a monitor and/or has critical drips?       [x]  Yes or  []  No or  []  Order obtained to travel without Monitor/nurse    Any needs/concerns that need to be addressed prior to admission? (i.e. ED/Admit provider or Nursing Supervisor)      []  Yes or  [x]  No    Does patient require IV fluid continued on admission?                               [x]  Yes or  []  No      [name]

## 2017-02-08 NOTE — Progress Notes (Signed)
Called to bedside as pt is requesting to leave the hospital. Reviewed his treatment plan including treatment of HTN emergency, chest pain, receiving dialysis. Reviewed consequences of leaving without treatment of these conditions including acute MI, worsened breathing, further electrolyte abnormalities from missed HD all which could result in pt death. Pt expressed understanding of the implications of leaving the hospital and elected [facility]

## 2017-02-08 NOTE — Progress Notes (Addendum)
[id number] Took over care of pt. RN noted everything in her note at the pt bedside. Pt confirmed. Pt did allow me to place BP cuff on for a reading, but did start drinking Pepsi stating "I'm not waiting for a doctor to come before I eat, that could be all day."    0530 Pt requesting to leave AMA and to sign papers, spoke to Dr Rubbie BattiestBridge.    [id number] Dr Rubbie BattiestBridge at bedside explaining risks associated to leaving and possibility of death if he does, pt states he understands.    0555 Pt leaves via wheelchair AMA.     AMA papers in file.

## 2017-02-08 NOTE — Progress Notes (Addendum)
TRANSFER - IN REPORT:    Verbal report received from Juma, RN(name) on David [name]  being received from ED(unit) for routine progression of care      Report consisted of patient???s Situation, Background, Assessment and   Recommendations(SBAR).     Information from the following report(s) SBAR, Kardex and ED Summary was reviewed with the receiving nurse.    Opportunity for questions and clarification was provided.      Assessment completed upon patient???s arrival to unit and care assumed.     0100 - Patient arrived to floor. New orders received to start cardene gtt. Discussed with patient. Patient refused to have gtt started. Willing to remain on Nitro gtt. Asked if patient is willing to take PO blood pressure medications. Patient stated "I'm here just waiting for dialysis. I'm not going to be hooked up to this cuff (BP) all night long and I don't want this thing on my finger". Patient removed BP cuff. Discussed importance of leaving cuff on due to currently having high BP readings and on a medication to bring it down but patient still refuses to put cuff back on. Notified Dr. Rubbie BattiestBridge of refusal of medications and medical equipment.    0300 - Patient refused AM labs. States "they can do that with my dialysis today". Patient requesting to eat and pulls out microwave meal from bag as well as Pepsi. Advised patient that he is NPO and cannot have anything to eat or drink at this time. Patient states "I'm not waiting until the doctor comes to see me to eat or drink something."    Bedside and Verbal shift change report given to Kasandra KnudsenMonique Grant, RN (oncoming nurse) by Lillia AbedLindsay, RN (offgoing nurse). Report included the following information SBAR, Kardex and ED Summary.

## 2017-02-08 NOTE — ED Notes (Signed)
TRANSFER - OUT REPORT:    Verbal report given to Lillia AbedLindsay, RN on David [name]  being transferred to ICU for routine progression of care       Report consisted of patient???s Situation, Background, Assessment and   Recommendations(SBAR).     Information from the following report(s) SBAR, MAR and Cardiac Rhythm Sinus Tach was reviewed with the receiving nurse.    Lines:   Peripheral IV 02/07/17 Right;Upper Arm (Active)   Site Assessment Clean, dry, & intact;Other (Comment) 02/07/2017  7:21 PM   Phlebitis Assessment 0 02/07/2017  7:21 PM   Infiltration Assessment 0 02/07/2017  7:21 PM   Hub Color/Line Status Pink 02/07/2017  7:21 PM   Alcohol Cap Used No 02/07/2017  7:21 PM       Peripheral IV 02/07/17 Left External jugular (Active)   Site Assessment Clean, dry, & intact 02/07/2017 10:35 PM   Phlebitis Assessment 0 02/07/2017 10:35 PM   Infiltration Assessment 0 02/07/2017 10:35 PM   Dressing Status Clean, dry, & intact 02/07/2017 10:35 PM   Dressing Type Transparent 02/07/2017 10:35 PM   Hub Color/Line Status Pink;Infusing;Flushed;Patent 02/07/2017 10:35 PM        Opportunity for questions and clarification was provided.      Patient transported with:   Monitor  Registered Nurse

## 2017-02-11 ENCOUNTER — Emergency Department: Admit: 2017-02-11 | Payer: MEDICARE

## 2017-02-11 ENCOUNTER — Inpatient Hospital Stay
Admit: 2017-02-11 | Discharge: 2017-02-12 | Disposition: A | Discharge status: Home / Self Care | Payer: MEDICARE | Attending: Emergency Medicine

## 2017-02-11 ENCOUNTER — Emergency Department: Admit: 2017-02-12 | Payer: MEDICARE

## 2017-02-11 DIAGNOSIS — E875 Hyperkalemia: Secondary | ICD-10-CM

## 2017-02-11 LAB — CULTURE, BLOOD, PAIRED: Culture result:: NO GROWTH

## 2017-02-11 MED ORDER — IPRATROPIUM-ALBUTEROL 2.5 MG-0.5 MG/3 ML NEB SOLUTION
2.5 mg-0.5 mg/3 ml | RESPIRATORY_TRACT | Status: AC
Start: 2017-02-11 — End: 2017-02-11
  Administered 2017-02-12: via RESPIRATORY_TRACT

## 2017-02-11 MED ORDER — DIPHENHYDRAMINE HCL 50 MG/ML IJ SOLN
50 mg/mL | INTRAMUSCULAR | Status: AC
Start: 2017-02-11 — End: 2017-02-11
  Administered 2017-02-12: via INTRAVENOUS

## 2017-02-11 MED ORDER — PROCHLORPERAZINE EDISYLATE 5 MG/ML INJECTION
5 mg/mL | INTRAMUSCULAR | Status: AC
Start: 2017-02-11 — End: 2017-02-11
  Administered 2017-02-12: via INTRAVENOUS

## 2017-02-11 MED FILL — PROCHLORPERAZINE EDISYLATE 5 MG/ML INJECTION: 5 mg/mL | INTRAMUSCULAR | Qty: 2

## 2017-02-11 MED FILL — DIPHENHYDRAMINE HCL 50 MG/ML IJ SOLN: 50 mg/mL | INTRAMUSCULAR | Qty: 1

## 2017-02-11 MED FILL — IPRATROPIUM-ALBUTEROL 2.5 MG-0.5 MG/3 ML NEB SOLUTION: 2.5 mg-0.5 mg/3 ml | RESPIRATORY_TRACT | Qty: 3

## 2017-02-11 NOTE — ED Notes (Signed)
Dialysis informed this nurse that she is on her way patient informed.

## 2017-02-11 NOTE — ED Notes (Signed)
Patient sleeping in wheelchair requesting something additional for pain, " my stomach and chest are hurting, it started up again in CT".  MD notified.

## 2017-02-11 NOTE — ED Provider Notes (Addendum)
46 y.o. male with past medical history significant for C. Diff, HTN, SBP, ascites, ESRD, CAD w/ h/o MI (2010), OSA, asthma, s/p stent placement (2x), and chronic respiratory failure w/ hypoxia (on 3-4L O2 via nasal canula)  presents with chief complaint of n/v/d that began on 02/05/2017, as well as a central chest pain that began yesterday. Pt describes his chest pain as a "pressure" that radiates to his jaw and LUE, noting associated SOB. He also reports that he has been having 7-8 episodes of bloody diarrhea, noting associated abd pain and a fever of 101.0. Pt reports that he has had C. Diff 14 times total with last experience 1 month ago, and is concerned that it may be returning despite several rounds of abx. He states that was told that he needs a fecal transplant and has been trying to get into [facility] for the procedure. He reports that he recently moved from [place] and was regularly seen at [facility], but does not currently have any providers that regularly see him. He also states that he has had a liver biopsy in the recent past, which he reports was negative. Pt reports last dialysis was on 02/07/17.  Pt denies any other sx currently.There are no other acute medical concerns at this time.    Social hx: Patient reports daily Tobacco use. Denies EtOH use. Denies illicit drug abuse.  PCP: Other, Phys, MD    Note written by Blanchie Dessert, Scribe, as dictated by Domingo Cocking, MD 8:16 PM          The history is provided by the patient. No language interpreter was used.        7:24 PM  I have evaluated the patient as the Provider in Triage. I have reviewed His vital signs and the triage nurse assessment. I have talked with the patient and any available family and advised that I am the provider in triage and have ordered the appropriate study to initiate their work up based on the clinical presentation during my assessment.  I have advised  that the patient will be accommodated in the Main ED as soon as possible.  I have also requested to contact the triage nurse or myself immediately if the patient experiences any changes in their condition during this brief waiting period. N/v/d since this weekend thinks cdiff is back + cp and sob  Pierre Bali, MD       Past Medical History:   Diagnosis Date   ??? Adverse effect of anesthesia     sleep apnea uses oxygen at night    ??? Ascites    ??? Asthma    ??? C. difficile colitis    ??? Chronic kidney disease    ??? Chronic respiratory failure with hypoxia (HCC)     on 3-4L n/c at home.   ??? ESRD (end stage renal disease) on dialysis [facility])    ??? HTN (hypertension)    ??? Hypertension    ??? Ill-defined condition     Ascites   ??? Myocardial infarct [facility])    ??? OSA (obstructive sleep apnea)     reports does not have cpap   ??? Peritoneal dialysis catheter infection (HCC)    ??? SBP (spontaneous bacterial peritonitis) (HCC)    ??? Vomiting 08/20/2016       Past Surgical History:   Procedure Laterality Date   ??? HX GI      Groin & Hernia repair   ??? HX HERNIA REPAIR  2008    right inguinal and umbilical   ??? HX VASCULAR ACCESS      Hx of one site on R, 2 on L, currently dialyzing through L Tunnel cath.   ??? HX VASCULAR ACCESS      PD Catheter placed and removed   ??? SIGMOIDOSCOPY,DIAGNOSTIC  03/04/2016              Family History:   Problem Relation Age of Onset   ??? Hypertension Other    ??? Hypertension Mother    ??? Kidney Disease Neg Hx    ??? Liver Disease Neg Hx        Social History     Socioeconomic History   ??? Marital status: DIVORCED     Spouse name: Not on file   ??? Number of children: Not on file   ??? Years of education: Not on file   ??? Highest education level: Not on file   Social Needs   ??? Financial resource strain: Not on file   ??? Food insecurity - worry: Not on file   ??? Food insecurity - inability: Not on file   ??? Transportation needs - medical: Not on file   ??? Transportation needs - non-medical: Not on file   Occupational History    ??? Not on file   Tobacco Use   ??? Smoking status: Current Every Day Smoker     Packs/day: 0.25     Years: 10.00     Pack years: 2.50   ??? Tobacco comment: 3 cigs. daily   Substance and Sexual Activity   ??? Alcohol use: No     Frequency: Never   ??? Drug use: No   ??? Sexual activity: Not on file   Other Topics Concern   ??? Not on file   Social History Narrative    ** Merged History Encounter **              ALLERGIES: Lisinopril; Lisinopril; Morphine; Toradol [ketorolac]; and Toradol [ketorolac]    Review of Systems   Constitutional: Negative for diaphoresis and fever.   HENT: Negative for facial swelling.    Eyes: Negative for visual disturbance.   Respiratory: Negative for cough.    Cardiovascular: Negative for chest pain.   Gastrointestinal: Negative for abdominal pain.   Genitourinary: Negative for dysuria.   Musculoskeletal: Negative for joint swelling.   Skin: Negative for rash.   Neurological: Negative for headaches.   Hematological: Negative for adenopathy.   Psychiatric/Behavioral: Negative for suicidal ideas.       There were no vitals filed for this visit.         Physical Exam   Constitutional: He is oriented to person, place, and time. He appears well-developed and well-nourished. No distress.   Pt is obese.   HENT:   Head: Normocephalic and atraumatic.   Mouth/Throat: Oropharynx is clear and moist.   Eyes: Pupils are equal, round, and reactive to light.   Neck: Normal range of motion. Neck supple.   Cardiovascular: Regular rhythm, normal heart sounds and intact distal pulses. Tachycardia present.   Pulmonary/Chest: Effort normal. No respiratory distress.   Expiratory wheezing present.    Abdominal: Soft. Bowel sounds are normal. He exhibits no distension. There is no tenderness.   Musculoskeletal: Normal range of motion. He exhibits no edema.   Pt has a dialysis fistula in LUE.   Neurological: He is alert and oriented to person, place, and time.   Skin: Skin is warm and  dry.   Nursing note and vitals reviewed.        MDM  Number of Diagnoses or Management Options  Diagnosis management comments: A:  46yo M with multiple medical problems including ESRD on HD, recurrent c. Diff, ascites known for med noncompliance, drug-seeking behavior, and multiple AMA's.  Pt missed 2 HD treatments due to diarrhea.  Pt untruthful about his last admissions and medical history stating that he is new to the area and doesn't have any records with him.  He has given this excuse before and has multiple admissions around [place] with similar complaints.    P:  ecg  Labs  cxr  zofran  reassess       K=6.8  Other labs at baseline.    9:54 PM  Discussed with Dr. Griselda MinerKunaparaju.  He will have nurse come to ED to perform HD.  Dr. Corie ChiquitoKunaparju informed me that the patient just left [facility] AMA because they refused to give him additional pain medication.      Pt will be discharged following dialysis.    11:01 PM  Pt signed out to Dr. Rennis ChrisEngel pending completion of dialysis and discharge.    Procedures

## 2017-02-11 NOTE — ED Notes (Signed)
Dialysis nurse at bedside

## 2017-02-11 NOTE — ED Triage Notes (Signed)
Pt states he thinks he has cdiff back

## 2017-02-11 NOTE — ED Notes (Signed)
Patient placed on cardiac monitor, continuous pulse ox and BP to cycle q 15 minutes. Patient refusing to get into bed, states he feels better sitting in wheelchair. Patient placed on o2 at 3 liters via NC. Patient states he is on 3 liters chronically at home. Patient is Monday, Wednesday and Friday dialysis patient; last treatment was Monday. Patient states he missed Wednesday and Friday treatments due to diarrhea.

## 2017-02-11 NOTE — Progress Notes (Signed)
[facility]: MED RECONCILIATION    Comments/Recommendations: Discussed current PTA medication list with patient. Reviewed drug allergies and recent changes to PTA medications. The patient was questioned regarding recent use of other prescription and nonprescription medications not listed on their current medication list.  ? Patient does not have an RxQuery to review prescription fill history. Patient knows his medications and doses well, pharmacist confirmed doses with recent hospital admission/discharge summary [facility].    Medications added:     ?? Albuterol HFA every 4 hours as needed- Patient states that he does not take any maintenance inhalers (he was previously on combivent) and only has the albuterol inhaler as needed    Medications removed:    ?? Duplicate medications    Medications adjusted:    ?? Amlodipine 10 mg daily  ?? Carvedilol 12.5 mg BID meals  ?? Furosemide 80 mg daily  ?? Hydralazine 100 mg TID  ?? Sevelamer 800 mg TID meals    Information obtained from: Patient, RxQuery, Recent hospital discharge summary (multiple sites [facility])    Allergies: Lisinopril; Lisinopril; Morphine; Toradol [ketorolac]; and Toradol [ketorolac]    Prior to Admission Medications:     Medication Documentation Review Audit       Reviewed by Suszanne Finchaylor, Brandon, PHARMD (Pharmacist) on 02/11/17 at 2210      Medication Sig Documenting Provider Last Dose Status Taking?   albuterol (PROVENTIL HFA, VENTOLIN HFA, PROAIR HFA) 90 mcg/actuation inhaler Take 2 Puffs by inhalation every four (4) hours as needed for Wheezing. Other, Phys, MD  Active Yes   amLODIPine (NORVASC) 10 mg tablet Take 10 mg by mouth daily. Other, Phys, MD 02/11/2017 AM Active Yes   carvedilol (COREG) 12.5 mg tablet Take 12.5 mg by mouth two (2) times daily (with meals). Other, Phys, MD 02/11/2017 AM Active Yes   furosemide (LASIX) 80 mg tablet Take 80 mg by mouth daily. Other, Phys, MD 02/11/2017 AM Active Yes    hydrALAZINE (APRESOLINE) 100 mg tablet Take 100 mg by mouth three (3) times daily. Other, Phys, MD 02/11/2017 Unknown time Active Yes   sevelamer carbonate (RENVELA) 800 mg tab tab Take 800 mg by mouth three (3) times daily (with meals). Provider, Historical 02/11/2017 PM Active Yes                  Suszanne FinchBrandon Taylor, PHARMD   Contact: [phone]

## 2017-02-11 NOTE — ED Notes (Signed)
Patient report given to Lauren May, RN.

## 2017-02-11 NOTE — ED Notes (Signed)
Dr. Mason at bedside for initial evaluation.

## 2017-02-11 NOTE — ED Triage Notes (Signed)
Pt states onset chest pain yesterday with nausea, vomiting and diarrhea, pt states increase shortness of breath.

## 2017-02-11 NOTE — Consults (Signed)
Pt signed out AMA just a few hours ago from [facility] as hospitalist refused to give him Pain meds.   Chart and labs reviewed  Dialysis orders placed and notified [facility]   Pt will be dialyzed shortly in ER and probably can be discharged from renal stand point       Sri  Dawnell Bryant MD  [facility]  Office :[phone]  Fax: [phone]

## 2017-02-11 NOTE — Consults (Signed)
Pt signed out AMA just a few hours ago from [facility] as hospitalist refused to give him Pain meds.   Chart and labs reviewed  Dialysis orders placed and notified [facility]   Pt will be dialyzed shortly in ER and probably can be discharged from renal stand point       GuernseySri  Cathyrn Deas MD  [facility]  Office :[phone]  Fax: [phone]

## 2017-02-12 LAB — CBC WITH AUTOMATED DIFF
ABS. BASOPHILS: 0 10*3/uL (ref 0.0–0.1)
ABS. EOSINOPHILS: 0 10*3/uL (ref 0.0–0.4)
ABS. IMM. GRANS.: 0.1 10*3/uL — ABNORMAL HIGH (ref 0.00–0.04)
ABS. LYMPHOCYTES: 0.2 10*3/uL — ABNORMAL LOW (ref 0.8–3.5)
ABS. MONOCYTES: 0.2 10*3/uL (ref 0.0–1.0)
ABS. NEUTROPHILS: 5.7 10*3/uL (ref 1.8–8.0)
ABSOLUTE NRBC: 0 10*3/uL (ref 0.00–0.01)
BASOPHILS: 0 % (ref 0–1)
EOSINOPHILS: 0 % (ref 0–7)
HCT: 23.9 % — ABNORMAL LOW (ref 36.6–50.3)
HGB: 7.4 g/dL — ABNORMAL LOW (ref 12.1–17.0)
IMMATURE GRANULOCYTES: 1 % — ABNORMAL HIGH (ref 0.0–0.5)
LYMPHOCYTES: 3 % — ABNORMAL LOW (ref 12–49)
MCH: 29.7 PG (ref 26.0–34.0)
MCHC: 31 g/dL (ref 30.0–36.5)
MCV: 96 FL (ref 80.0–99.0)
MONOCYTES: 3 % — ABNORMAL LOW (ref 5–13)
MPV: 10.2 FL (ref 8.9–12.9)
NEUTROPHILS: 93 % — ABNORMAL HIGH (ref 32–75)
NRBC: 0 PER 100 WBC
PLATELET: 131 10*3/uL — ABNORMAL LOW (ref 150–400)
RBC: 2.49 M/uL — ABNORMAL LOW (ref 4.10–5.70)
RDW: 16.1 % — ABNORMAL HIGH (ref 11.5–14.5)
WBC: 6.2 10*3/uL (ref 4.1–11.1)

## 2017-02-12 LAB — METABOLIC PANEL, COMPREHENSIVE
A-G Ratio: 0.7 — ABNORMAL LOW (ref 1.1–2.2)
ALT (SGPT): 17 U/L (ref 12–78)
AST (SGOT): 28 U/L (ref 15–37)
Albumin: 2.9 g/dL — ABNORMAL LOW (ref 3.5–5.0)
Alk. phosphatase: 272 U/L — ABNORMAL HIGH (ref 45–117)
Anion gap: 19 mmol/L — ABNORMAL HIGH (ref 5–15)
BUN/Creatinine ratio: 6 — ABNORMAL LOW (ref 12–20)
BUN: 82 MG/DL — ABNORMAL HIGH (ref 6–20)
Bilirubin, total: 0.3 MG/DL (ref 0.2–1.0)
CO2: 18 mmol/L — ABNORMAL LOW (ref 21–32)
Calcium: 5.2 MG/DL — CL (ref 8.5–10.1)
Chloride: 102 mmol/L (ref 97–108)
Creatinine: 14.42 MG/DL — ABNORMAL HIGH (ref 0.70–1.30)
GFR est AA: 4 mL/min/{1.73_m2} — ABNORMAL LOW (ref >60)
GFR est non-AA: 4 mL/min/{1.73_m2} — ABNORMAL LOW (ref >60)
Globulin: 4.1 g/dL — ABNORMAL HIGH (ref 2.0–4.0)
Glucose: 133 mg/dL — ABNORMAL HIGH (ref 65–100)
Potassium: 6.8 mmol/L — CR (ref 3.5–5.1)
Protein, total: 7 g/dL (ref 6.4–8.2)
Sodium: 139 mmol/L (ref 136–145)

## 2017-02-12 LAB — HEP B SURFACE AG
Hep B surface Ag Interp.: NEGATIVE
Hepatitis B surface Ag: <0.1 Index

## 2017-02-12 LAB — PTT: aPTT: 33.3 s — ABNORMAL HIGH (ref 22.1–32.0)

## 2017-02-12 LAB — TROPONIN I: Troponin-I, Qt.: 0.08 ng/mL — ABNORMAL HIGH (ref <0.05)

## 2017-02-12 LAB — MAGNESIUM: Magnesium: 1.6 mg/dL (ref 1.6–2.4)

## 2017-02-12 LAB — SAMPLES BEING HELD

## 2017-02-12 LAB — PHOSPHORUS: Phosphorus: 6.4 MG/DL — ABNORMAL HIGH (ref 2.6–4.7)

## 2017-02-12 LAB — PROTHROMBIN TIME + INR
INR: 1.1 (ref 0.9–1.1)
Prothrombin time: 11.2 s — ABNORMAL HIGH (ref 9.0–11.1)

## 2017-02-12 LAB — NT-PRO BNP: NT pro-BNP: >35000 PG/ML — ABNORMAL HIGH (ref 0–125)

## 2017-02-12 MED ORDER — OXYCODONE-ACETAMINOPHEN 5 MG-325 MG TAB
5-325 mg | ORAL | Status: AC
Start: 2017-02-12 — End: 2017-02-11
  Administered 2017-02-12: 02:00:00 via ORAL

## 2017-02-12 MED ORDER — HYDROMORPHONE (PF) 2 MG/ML IJ SOLN
2 mg/mL | INTRAMUSCULAR | Status: AC
Start: 2017-02-12 — End: 2017-02-11
  Administered 2017-02-12: via INTRAVENOUS

## 2017-02-12 MED ORDER — ONDANSETRON (PF) 4 MG/2 ML INJECTION
4 mg/2 mL | INTRAMUSCULAR | Status: AC
Start: 2017-02-12 — End: 2017-02-11
  Administered 2017-02-12: via INTRAVENOUS

## 2017-02-12 MED ORDER — ASPIRIN 81 MG CHEWABLE TAB
81 mg | ORAL | Status: AC
Start: 2017-02-12 — End: 2017-02-11
  Administered 2017-02-12: via ORAL

## 2017-02-12 MED FILL — OXYCODONE-ACETAMINOPHEN 5 MG-325 MG TAB: 5-325 mg | ORAL | Qty: 1

## 2017-02-12 MED FILL — HYDROMORPHONE (PF) 2 MG/ML IJ SOLN: 2 mg/mL | INTRAMUSCULAR | Qty: 1

## 2017-02-12 MED FILL — ONDANSETRON (PF) 4 MG/2 ML INJECTION: 4 mg/2 mL | INTRAMUSCULAR | Qty: 2

## 2017-02-12 MED FILL — ASPIRIN 81 MG CHEWABLE TAB: 81 mg | ORAL | Qty: 4

## 2017-02-12 NOTE — ED Notes (Signed)
Bedside and Verbal shift change report given to Roxanne RN (oncoming nurse) by Lauren RN (offgoing nurse). Report included the following information ED Summary.

## 2017-02-12 NOTE — Other (Signed)
[facility] D[facility] Team [facility] [place]  [phone]    Vitals   Pre   Post   Assessment   Pre   Post     Temp  Temp: 98.3 ??F (36.8 ??C) (02/12/17 0042)   LOC  Lethargic/oriented x4/ responds to commands No change   HR   Pulse (Heart Rate): 94 (02/12/17 0042) 125 Lungs   2lpm NC/ unlabored  No change   B/P   BP: (!) 186/124 (02/12/17 0042) 212/136 Cardiac   NSR/ monitored bedside  no change   Resp   Resp Rate: 19 (02/12/17 0042) 19 Skin   Warm/ dry  No change   Pain level  Pain Intensity 1: 10 (02/11/17 2154) C/o cramping Edema  Generalized     No change   Orders:    Duration:   Start:    0040 End:   0310 Total:   3.5 hrs   Dialyzer:   Dialyzer/Set Up Inspection: Revaclear (02/12/17 0042)   Kirtland BouchardK Bath:   Dialysate K (mEq/L): 1 (02/12/17 0042)   Ca Bath:   Dialysate CA (mEq/L): 2.5 (02/12/17 0042)   Na/Bicarb:   Dialysate NA (mEq/L): 138 (02/12/17 0042)   Target Fluid Removal:   Goal/Amount of Fluid to Remove (mL): 3000 mL (02/12/17 0042)   Access     Type & Location:   Left subclavian CVC accessed with (+) aspiration/ flush, blood flow at 400 pump speed   Labs     Obtained/Reviewed   Critical Results Called   Date when labs were drawn-  Hgb-    HGB   Date Value Ref Range Status   02/11/2017 7.4 (L) 12.1 - 17.0 g/dL Final     K-    Potassium   Date Value Ref Range Status   02/11/2017 6.8 (HH) 3.5 - 5.1 mmol/L Final     Comment:     RESULTS VERIFIED, PHONED TO AND READ BACK BY  KRISTINA HEBERT RN @ 2041 BB       Ca-   Calcium   Date Value Ref Range Status   02/11/2017 5.2 (LL) 8.5 - 10.1 MG/DL Final     Comment:     RESULTS VERIFIED, PHONED TO AND READ BACK BY  KRISTINA HEBERT RN @ 2041 BB       Bun-   BUN   Date Value Ref Range Status   02/11/2017 82 (H) 6 - 20 MG/DL Final     Creat-   Creatinine   Date Value Ref Range Status   02/11/2017 14.42 (H) 0.70 - 1.30 MG/DL Final        Medications/ Blood Products Given     Name   Dose   Route and Time     NONE                Blood Volume Processed (BVP):    50.6    Net Fluid   Removed:  2500ml   Comments   Time Out Done: 0030  Primary Nurse Rpt Pre: Leotis ShamesLauren May, RN  Primary Nurse Rpt Post: Lauren May, RN  Pt Education: Hospital dialysis procedures  Care Plan: Discharge after Hemodialysis treatment  Tx Summary: Arrived to find patient in ER department, sleeping. BP elevated. Afebrile, no c/o pain.  0240-Patient c/o left chest cramping and requested to stop his treatment. This nurse convinced him to stay on a little longer with a decreased UF. Patient was agreeable. UF decreased to 2500ml.  0300-Patient requested again to be taken off  stating "I'm done." Treatment ended at 0303   Post: Patient tolerated treatment until cramping during the last 30 minutes. All possible blood returned with normal saline rinse back. CVC ports locked with normal saline and capped.   Admiting Diagnosis: ESRD, Hyperkalemia, fluid overload  Pt's previous clinic- NONE  Consent signed - Informed Consent Verified: Yes (02/12/17 0042)  [facility] Consent - Obtained  Hepatitis Status- Drawn  Machine #- Machine Number: [id number] (02/12/17 4540)  Telemetry status- Bedside monitored  Pre-dialysis wt.- Pre-Dialysis Weight: 105.7 kg (233 lb 0.4 oz) (02/12/17 0042)

## 2017-02-12 NOTE — ED Notes (Signed)
Pt d/c from ED after dialysis was complete. Pt ambulate without difficulty.

## 2017-02-14 LAB — EKG, 12 LEAD, INITIAL
Atrial Rate: 98 {beats}/min
Calculated P Axis: 67 degrees
Calculated R Axis: -15 degrees
Calculated T Axis: 75 degrees
P-R Interval: 204 ms
Q-T Interval: 398 ms
QRS Duration: 86 ms
QTC Calculation (Bezet): 508 ms
Ventricular Rate: 98 {beats}/min

## 2017-02-14 LAB — EKG 12-LEAD
Atrial Rate: 98 {beats}/min
P Axis: 67 degrees
P-R Interval: 204 ms
Q-T Interval: 398 ms
QRS Duration: 86 ms
QTc Calculation (Bazett): 508 ms
R Axis: -15 degrees
T Axis: 75 degrees
Ventricular Rate: 98 {beats}/min

## 2017-02-18 ENCOUNTER — Encounter: Payer: Self-pay | Admitting: Residents

## 2017-02-18 ENCOUNTER — Inpatient Hospital Stay
Admission: AD | Admit: 2017-02-18 | Discharge: 2017-02-18 | DRG: 377 | Discharge status: Against Medical Advice | Payer: Self-pay | Source: Other Acute Inpatient Hospital | Attending: Hospitalist | Admitting: Hospitalist

## 2017-02-18 ENCOUNTER — Ambulatory Visit (INDEPENDENT_AMBULATORY_CARE_PROVIDER_SITE_OTHER): Payer: Self-pay

## 2017-02-18 ENCOUNTER — Inpatient Hospital Stay: Payer: Self-pay

## 2017-02-18 ENCOUNTER — Other Ambulatory Visit: Payer: Self-pay

## 2017-02-18 DIAGNOSIS — I2510 Atherosclerotic heart disease of native coronary artery without angina pectoris: Secondary | ICD-10-CM | POA: Diagnosis present

## 2017-02-18 DIAGNOSIS — R1030 Lower abdominal pain, unspecified: Secondary | ICD-10-CM | POA: Diagnosis present

## 2017-02-18 DIAGNOSIS — I161 Hypertensive emergency: Secondary | ICD-10-CM | POA: Diagnosis present

## 2017-02-18 DIAGNOSIS — A0471 Enterocolitis due to Clostridium difficile, recurrent: Secondary | ICD-10-CM | POA: Diagnosis present

## 2017-02-18 DIAGNOSIS — Z9989 Dependence on other enabling machines and devices: Secondary | ICD-10-CM

## 2017-02-18 DIAGNOSIS — I214 Non-ST elevation (NSTEMI) myocardial infarction: Secondary | ICD-10-CM

## 2017-02-18 DIAGNOSIS — G4733 Obstructive sleep apnea (adult) (pediatric): Secondary | ICD-10-CM

## 2017-02-18 DIAGNOSIS — Z9119 Patient's noncompliance with other medical treatment and regimen: Secondary | ICD-10-CM

## 2017-02-18 DIAGNOSIS — R9431 Abnormal electrocardiogram [ECG] [EKG]: Secondary | ICD-10-CM | POA: Diagnosis present

## 2017-02-18 DIAGNOSIS — E872 Acidosis: Secondary | ICD-10-CM | POA: Diagnosis present

## 2017-02-18 DIAGNOSIS — I248 Other forms of acute ischemic heart disease: Secondary | ICD-10-CM | POA: Diagnosis present

## 2017-02-18 DIAGNOSIS — D500 Iron deficiency anemia secondary to blood loss (chronic): Secondary | ICD-10-CM | POA: Diagnosis present

## 2017-02-18 DIAGNOSIS — Z992 Dependence on renal dialysis: Secondary | ICD-10-CM

## 2017-02-18 DIAGNOSIS — D631 Anemia in chronic kidney disease: Secondary | ICD-10-CM | POA: Diagnosis present

## 2017-02-18 DIAGNOSIS — N186 End stage renal disease: Secondary | ICD-10-CM | POA: Diagnosis present

## 2017-02-18 DIAGNOSIS — I160 Hypertensive urgency: Secondary | ICD-10-CM | POA: Diagnosis present

## 2017-02-18 DIAGNOSIS — K921 Melena: Principal | ICD-10-CM | POA: Diagnosis present

## 2017-02-18 DIAGNOSIS — E8729 Other acidosis: Secondary | ICD-10-CM | POA: Diagnosis present

## 2017-02-18 DIAGNOSIS — E669 Obesity, unspecified: Secondary | ICD-10-CM

## 2017-02-18 DIAGNOSIS — I132 Hypertensive heart and chronic kidney disease with heart failure and with stage 5 chronic kidney disease, or end stage renal disease: Secondary | ICD-10-CM | POA: Diagnosis present

## 2017-02-18 DIAGNOSIS — E875 Hyperkalemia: Secondary | ICD-10-CM | POA: Diagnosis present

## 2017-02-18 DIAGNOSIS — Z765 Malingerer [conscious simulation]: Secondary | ICD-10-CM

## 2017-02-18 DIAGNOSIS — D649 Anemia, unspecified: Secondary | ICD-10-CM

## 2017-02-18 DIAGNOSIS — Z955 Presence of coronary angioplasty implant and graft: Secondary | ICD-10-CM

## 2017-02-18 HISTORY — DX: Dependence on renal dialysis: Z99.2

## 2017-02-18 HISTORY — DX: Unspecified asthma, uncomplicated: J45.909

## 2017-02-18 HISTORY — DX: Other bacterial infections of unspecified site: A49.8

## 2017-02-18 HISTORY — DX: End stage renal disease: N18.6

## 2017-02-18 LAB — PREPARE RBC
Expiration Date: 201812032359
Status: TRANSFUSED
UTYPE: O POS

## 2017-02-18 LAB — PT AND APTT
PT INR: 1.3 — ABNORMAL HIGH (ref 0.9–1.1)
PT: 16.3 s — ABNORMAL HIGH (ref 12.6–15.0)
PTT: 36 s (ref 23–37)

## 2017-02-18 LAB — TROPONIN I
Troponin I: 0.12 ng/mL — ABNORMAL HIGH (ref 0.00–0.09)
Troponin I: 0.12 ng/mL — ABNORMAL HIGH (ref 0.00–0.09)

## 2017-02-18 LAB — VITAMIN B12: Vitamin B-12: 400 pg/mL (ref 211–911)

## 2017-02-18 LAB — IRON PROFILE
Iron Saturation: 58 % — ABNORMAL HIGH (ref 15–50)
Iron: 132 ug/dL (ref 40–160)
TIBC: 229 ug/dL — ABNORMAL LOW (ref 261–462)
UIBC: 97 ug/dL — ABNORMAL LOW (ref 126–382)

## 2017-02-18 LAB — HEPATITIS B SURFACE ANTIGEN W/ REFLEX TO CONFIRMATION: Hepatitis B Surface Antigen: NONREACTIVE

## 2017-02-18 LAB — HEPATITIS B SURFACE ANTIBODY: HEPATITIS B SURFACE ANTIBODY: <8

## 2017-02-18 LAB — ECG 12-LEAD
Atrial Rate: 81 {beats}/min
P Axis: 63 degrees
P-R Interval: 200 ms
Q-T Interval: 438 ms
QRS Duration: 86 ms
QTC Calculation (Bezet): 508 ms
R Axis: 10 degrees
T Axis: 80 degrees
Ventricular Rate: 81 {beats}/min

## 2017-02-18 LAB — CBC AND DIFFERENTIAL
Absolute NRBC: 0 10*3/uL
Basophils Absolute Automated: 0.04 10*3/uL (ref 0.00–0.20)
Basophils Automated: 0.6 %
Eosinophils Absolute Automated: 0.34 10*3/uL (ref 0.00–0.70)
Eosinophils Automated: 4.7 %
Hematocrit: 23 % — ABNORMAL LOW (ref 42.0–52.0)
Hgb: 7.1 g/dL — ABNORMAL LOW (ref 13.0–17.0)
Immature Granulocytes Absolute: 0.03 10*3/uL
Immature Granulocytes: 0.4 %
Lymphocytes Absolute Automated: 0.45 10*3/uL — ABNORMAL LOW (ref 0.50–4.40)
Lymphocytes Automated: 6.3 %
MCH: 30 pg (ref 28.0–32.0)
MCHC: 30.9 g/dL — ABNORMAL LOW (ref 32.0–36.0)
MCV: 97 fL (ref 80.0–100.0)
MPV: 10.4 fL (ref 9.4–12.3)
Monocytes Absolute Automated: 0.64 10*3/uL (ref 0.00–1.20)
Monocytes: 8.9 %
Neutrophils Absolute: 5.69 10*3/uL (ref 1.80–8.10)
Neutrophils: 79.1 %
Nucleated RBC: 0 /100 WBC (ref 0.0–1.0)
Platelets: 143 10*3/uL (ref 140–400)
RBC: 2.37 10*6/uL — ABNORMAL LOW (ref 4.70–6.00)
RDW: 16 % — ABNORMAL HIGH (ref 12–15)
WBC: 7.19 10*3/uL (ref 3.50–10.80)

## 2017-02-18 LAB — COMPREHENSIVE METABOLIC PANEL
ALT: 18 U/L (ref 0–55)
AST (SGOT): 30 U/L (ref 5–34)
Albumin/Globulin Ratio: 0.9 (ref 0.9–2.2)
Albumin: 3 g/dL — ABNORMAL LOW (ref 3.5–5.0)
Alkaline Phosphatase: 289 U/L — ABNORMAL HIGH (ref 38–106)
BUN: 81 mg/dL — ABNORMAL HIGH (ref 9.0–28.0)
Bilirubin, Total: 0.6 mg/dL (ref 0.2–1.2)
CO2: 15 mEq/L — ABNORMAL LOW (ref 22–29)
Calcium: 6.2 mg/dL — ABNORMAL LOW (ref 8.5–10.5)
Chloride: 104 mEq/L (ref 100–111)
Creatinine: 13.7 mg/dL — ABNORMAL HIGH (ref 0.7–1.3)
Globulin: 3.5 g/dL (ref 2.0–3.6)
Glucose: 80 mg/dL (ref 70–100)
Potassium: 6.2 mEq/L (ref 3.5–5.1)
Protein, Total: 6.5 g/dL (ref 6.0–8.3)
Sodium: 136 mEq/L (ref 136–145)

## 2017-02-18 LAB — LACTIC ACID, PLASMA: Lactic Acid: 0.9 mmol/L (ref 0.2–2.0)

## 2017-02-18 LAB — LIPID PANEL
Cholesterol / HDL Ratio: 2.6
Cholesterol: 153 mg/dL (ref 0–199)
HDL: 58 mg/dL (ref 40–9999)
LDL Calculated: 81 mg/dL (ref 0–99)
Triglycerides: 69 mg/dL (ref 34–149)
VLDL Calculated: 14 mg/dL (ref 10–40)

## 2017-02-18 LAB — FOLATE: Folate: 8.7 ng/mL

## 2017-02-18 LAB — TYPE AND SCREEN
AB Screen Gel: NEGATIVE
ABO Rh: O POS

## 2017-02-18 LAB — FERRITIN: Ferritin: 2386.91 ng/mL — ABNORMAL HIGH (ref 21.80–274.70)

## 2017-02-18 LAB — HEMOGLOBIN A1C
Average Estimated Glucose: 99.7 mg/dL
Hemoglobin A1C: 5.1 % (ref 4.6–5.9)

## 2017-02-18 LAB — HEPATITIS C ANTIBODY: Hepatitis C, AB: NONREACTIVE

## 2017-02-18 LAB — GFR: EGFR: 3.9

## 2017-02-18 LAB — HEMOGLOBIN AND HEMATOCRIT, BLOOD
Hematocrit: 25.2 % — ABNORMAL LOW (ref 42.0–52.0)
Hgb: 7.8 g/dL — ABNORMAL LOW (ref 13.0–17.0)

## 2017-02-18 LAB — MAGNESIUM: Magnesium: 1.6 mg/dL (ref 1.6–2.6)

## 2017-02-18 LAB — HEMOLYSIS INDEX
Hemolysis Index: 3 (ref 0–18)
Hemolysis Index: 32 — ABNORMAL HIGH (ref 0–18)

## 2017-02-18 LAB — B-TYPE NATRIURETIC PEPTIDE: B-Natriuretic Peptide: 2932 pg/mL — ABNORMAL HIGH (ref 0–100)

## 2017-02-18 LAB — LIPASE: Lipase: 49 U/L (ref 8–78)

## 2017-02-18 MED ORDER — NITROGLYCERIN IN D5W 200-5 MCG/ML-% IV SOLN
10.0000 ug/min | INTRAVENOUS | Status: DC
Start: 2017-02-18 — End: 2017-02-18

## 2017-02-18 MED ORDER — DEXTROSE 5 % IV SOLN
2000.0000 mg | Freq: Once | INTRAVENOUS | Status: DC
Start: 2017-02-18 — End: 2017-02-18

## 2017-02-18 MED ORDER — SODIUM CHLORIDE 0.9 % IV BOLUS
100.0000 mL | INTRAVENOUS | Status: AC | PRN
Start: 2017-02-18 — End: 2017-02-18

## 2017-02-18 MED ORDER — ALBUTEROL SULFATE (2.5 MG/3ML) 0.083% IN NEBU
2.5000 mg | INHALATION_SOLUTION | Freq: Four times a day (QID) | RESPIRATORY_TRACT | Status: DC | PRN
Start: 2017-02-18 — End: 2017-02-18

## 2017-02-18 MED ORDER — MAGNESIUM SULFATE IN D5W 1-5 GM/100ML-% IV SOLN
1.0000 g | INTRAVENOUS | Status: DC
Start: 2017-02-18 — End: 2017-02-18
  Filled 2017-02-18: qty 100

## 2017-02-18 MED ORDER — SODIUM CHLORIDE 0.9 % IV SOLN
1.0000 g | Freq: Once | INTRAVENOUS | Status: AC
Start: 2017-02-18 — End: 2017-02-18
  Administered 2017-02-18: 04:00:00 1 g via INTRAVENOUS
  Filled 2017-02-18: qty 10

## 2017-02-18 MED ORDER — HYDRALAZINE HCL 50 MG PO TABS
100.0000 mg | ORAL_TABLET | Freq: Three times a day (TID) | ORAL | Status: DC
Start: 2017-02-18 — End: 2017-02-18

## 2017-02-18 MED ORDER — MORPHINE SULFATE 4 MG/ML IJ/IV SOLN (WRAP)
4.0000 mg | Freq: Once | Status: DC
Start: 2017-02-18 — End: 2017-02-18
  Filled 2017-02-18: qty 1

## 2017-02-18 MED ORDER — CARVEDILOL 6.25 MG PO TABS
12.5000 mg | ORAL_TABLET | Freq: Two times a day (BID) | ORAL | Status: DC
Start: 2017-02-18 — End: 2017-02-18
  Filled 2017-02-18: qty 2

## 2017-02-18 MED ORDER — SEVELAMER CARBONATE 800 MG PO TABS
800.0000 mg | ORAL_TABLET | Freq: Three times a day (TID) | ORAL | Status: DC
Start: 2017-02-18 — End: 2017-02-18

## 2017-02-18 MED ORDER — ALBUMIN HUMAN 25 % IV SOLN
100.0000 mL | INTRAVENOUS | Status: AC | PRN
Start: 2017-02-18 — End: 2017-02-18

## 2017-02-18 MED ORDER — HYDROMORPHONE HCL 1 MG/ML PO LIQD
1.0000 mg | ORAL | Status: DC | PRN
Start: 2017-02-18 — End: 2017-02-18
  Filled 2017-02-18 (×2): qty 1

## 2017-02-18 MED ORDER — CALCIUM ACETATE 667 MG PO CAPS
1334.0000 mg | ORAL_CAPSULE | Freq: Three times a day (TID) | ORAL | 0 refills | Status: AC
Start: 2017-02-19 — End: 2017-03-21
  Filled 2017-02-18: qty 180, 30d supply, fill #0

## 2017-02-18 MED ORDER — VANCOMYCIN HCL 125 MG PO CAPS
125.0000 mg | ORAL_CAPSULE | Freq: Four times a day (QID) | ORAL | 0 refills | Status: AC
Start: 2017-02-18 — End: 2017-02-28
  Filled 2017-02-18: qty 40, 10d supply, fill #0

## 2017-02-18 MED ORDER — AMLODIPINE BESYLATE 5 MG PO TABS
10.0000 mg | ORAL_TABLET | Freq: Every day | ORAL | Status: DC
Start: 2017-02-18 — End: 2017-02-18
  Filled 2017-02-18: qty 2

## 2017-02-18 MED ORDER — ACETAMINOPHEN 325 MG PO TABS
650.0000 mg | ORAL_TABLET | ORAL | Status: DC | PRN
Start: 2017-02-18 — End: 2017-02-18

## 2017-02-18 MED ORDER — HEPARIN SODIUM (PORCINE) 5000 UNIT/ML IJ SOLN
5000.0000 [IU] | Freq: Three times a day (TID) | INTRAMUSCULAR | Status: DC
Start: 2017-02-18 — End: 2017-02-18
  Filled 2017-02-18: qty 1

## 2017-02-18 MED ORDER — LABETALOL HCL 5 MG/ML IV SOLN (WRAP)
10.0000 mg | Freq: Once | INTRAVENOUS | Status: DC
Start: 2017-02-18 — End: 2017-02-18
  Filled 2017-02-18: qty 4

## 2017-02-18 MED ORDER — SODIUM CHLORIDE 0.9 % IV BOLUS
250.0000 mL | INTRAVENOUS | Status: AC | PRN
Start: 2017-02-18 — End: 2017-02-18

## 2017-02-18 MED ORDER — MORPHINE SULFATE 2 MG/ML IJ/IV SOLN (WRAP)
4.0000 mg | Freq: Once | Status: DC
Start: 2017-02-18 — End: 2017-02-18

## 2017-02-18 MED ORDER — HYDROMORPHONE HCL 1 MG/ML IJ SOLN
1.0000 mg | INTRAMUSCULAR | Status: DC | PRN
Start: 2017-02-18 — End: 2017-02-18
  Administered 2017-02-18: 1 mg via INTRAVENOUS
  Filled 2017-02-18: qty 1

## 2017-02-18 MED ORDER — DARBEPOETIN ALFA 60 MCG/0.3ML IJ SOSY
60.0000 ug | PREFILLED_SYRINGE | INTRAMUSCULAR | Status: DC
Start: 2017-02-18 — End: 2017-02-18
  Filled 2017-02-18: qty 0.3

## 2017-02-18 MED ORDER — NALOXONE HCL 0.4 MG/ML IJ SOLN (WRAP)
0.2000 mg | INTRAMUSCULAR | Status: DC | PRN
Start: 2017-02-18 — End: 2017-02-18

## 2017-02-18 MED ORDER — VANCOMYCIN ORAL SOLUTION 50 MG/ML UNIT DOSE
125.0000 mg | Freq: Four times a day (QID) | ORAL | Status: DC
Start: 2017-02-18 — End: 2017-02-18
  Filled 2017-02-18 (×5): qty 2.5

## 2017-02-18 MED ORDER — HYDROMORPHONE HCL 0.5 MG/0.5 ML IJ SOLN
0.5000 mg | INTRAMUSCULAR | Status: DC | PRN
Start: 2017-02-18 — End: 2017-02-18

## 2017-02-18 MED ORDER — HYDROMORPHONE HCL 0.5 MG/0.5 ML IJ SOLN
0.5000 mg | Freq: Once | INTRAMUSCULAR | Status: AC
Start: 2017-02-18 — End: 2017-02-18
  Administered 2017-02-18: 14:00:00 0.5 mg via INTRAVENOUS
  Filled 2017-02-18: qty 1

## 2017-02-18 MED ORDER — LABETALOL HCL 5 MG/ML IV SOLN (WRAP)
20.0000 mg | Freq: Once | INTRAVENOUS | Status: DC
Start: 2017-02-18 — End: 2017-02-18

## 2017-02-18 MED ORDER — FUROSEMIDE 40 MG PO TABS
80.0000 mg | ORAL_TABLET | Freq: Every day | ORAL | Status: DC
Start: 2017-02-18 — End: 2017-02-18

## 2017-02-18 MED ORDER — CARVEDILOL 6.25 MG PO TABS
12.5000 mg | ORAL_TABLET | Freq: Two times a day (BID) | ORAL | Status: DC
Start: 2017-02-18 — End: 2017-02-18

## 2017-02-18 MED ORDER — SODIUM CHLORIDE 0.9 % IV SOLN
INTRAVENOUS | Status: DC | PRN
Start: 2017-02-18 — End: 2017-02-18

## 2017-02-18 MED ORDER — DARBEPOETIN ALFA 60 MCG/0.3ML IJ SOSY
60.0000 ug | PREFILLED_SYRINGE | INTRAMUSCULAR | 0 refills | Status: AC
Start: 2017-02-25 — End: 2017-03-27
  Filled 2017-02-18: qty 1.2, 28d supply, fill #0

## 2017-02-18 MED ORDER — HYDROMORPHONE HCL 1 MG/ML IJ SOLN
1.0000 mg | Freq: Once | INTRAMUSCULAR | Status: AC
Start: 2017-02-18 — End: 2017-02-18
  Administered 2017-02-18: 07:00:00 1 mg via INTRAVENOUS
  Filled 2017-02-18: qty 1

## 2017-02-18 MED ORDER — ACETAMINOPHEN 650 MG RE SUPP
650.0000 mg | RECTAL | Status: DC | PRN
Start: 2017-02-18 — End: 2017-02-18

## 2017-02-18 MED ORDER — CALCIUM ACETATE 667 MG PO CAPS
1334.0000 mg | ORAL_CAPSULE | Freq: Three times a day (TID) | ORAL | Status: DC
Start: 2017-02-18 — End: 2017-02-18

## 2017-02-18 MED ORDER — HYDROMORPHONE HCL 1 MG/ML IJ SOLN
1.0000 mg | Freq: Once | INTRAMUSCULAR | Status: AC
Start: 2017-02-18 — End: 2017-02-18
  Administered 2017-02-18: 10:00:00 1 mg via INTRAVENOUS
  Filled 2017-02-18: qty 1

## 2017-02-18 MED ORDER — SODIUM CHLORIDE 0.9 % IV SOLN
1.0000 g | INTRAVENOUS | Status: AC
Start: 2017-02-18 — End: 2017-02-18
  Administered 2017-02-18: 09:00:00 1 g via INTRAVENOUS
  Filled 2017-02-18 (×2): qty 10

## 2017-02-18 MED ORDER — LABETALOL HCL 5 MG/ML IV SOLN (WRAP)
20.0000 mg | Freq: Four times a day (QID) | INTRAVENOUS | Status: DC | PRN
Start: 2017-02-18 — End: 2017-02-18

## 2017-02-18 NOTE — Discharge Summary (Signed)
MEDICINE DISCHARGE SUMMARY - patient left AMA    Date Time: 02/18/17 7:03 PM  Patient Name: [facility]  Attending Physician: No att. providers found  Primary Care Physician: No primary care provider on file.    Date of Admission: 02/18/2017  Date of Discharge: 02/18/2017    Discharge Diagnoses:     Principal Diagnosis (Diagnosis after study, that is chiefly responsible for admission to inpatient status):     Non-compliance  Drug seeking behavior  Demand ischemia  Acute hypoxemic respiratory failure   Hypertensive urgency  Bloody diarrhea, r/o Cdiff        Disposition:      Home, patient left AMA    Pending Results, Recommendations & Instructions to providers after discharge:     1. Micro / Labs / Path pending:   Unresulted Labs     Procedure . . . Date/Time    Transfuse 1 unit RBC [[id number]] Resulted:  02/18/17 0523     Updated:  02/18/17 0523    Clostridium difficile toxin B PCR [[id number]] Collected:  02/18/17 0237    Specimen:  Stool from Stool Updated:  02/18/17 0237    Narrative:       Patient's current admission began:->less than or equal to 3  days ago  Cancel C-Diff order if no diarrhea in 12 hours?->Yes  DELAY AFFECTS RESULT        2. Wound Care Instructions: n/a  3. Date of completion for antibiotics or other medications: n/a  4. Other: n/a    Recent Labs - Last 2:         Recent Labs  Lab 02/18/17  1331 02/18/17  0237   WBC  --  7.19   Hgb 7.8* 7.1*   Hematocrit 25.2* 23.0*   Platelets  --  143         Recent Labs  Lab 02/18/17  0237   PT 16.3*   PT INR 1.3*   PTT 36       Recent Labs  Lab 02/18/17  0237   Alkaline Phosphatase 289*   Bilirubin, Total 0.6   Protein, Total 6.5   Albumin 3.0*   ALT 18   AST (SGOT) 30        Recent Labs  Lab 02/18/17  0237   Sodium 136   Potassium 6.2*   Chloride 104   CO2 15*   BUN 81.0*   Glucose 80   Calcium 6.2*   Magnesium 1.6         Recent Labs  Lab 02/18/17  0237   Cholesterol 153   Triglycerides 69   HDL 58   LDL Calculated 81           Invalid input(s):  FREET4         Procedures/Radiology performed:   Radiology: all results from this admission  Xr Abdomen Ap    Result Date: 02/18/2017   No acute radiographic abnormality. Demetrios Isaacs, MD 02/18/2017 6:01 AM    Xr Chest Ap Portable    Result Date: 02/18/2017   Cardiomegaly with minimal bibasilar atelectasis. Stephannie Peters, MD 02/18/2017 9:04 AM       Hospital Course:     Reason for admission/ HPI: transfer from [facility] for further evaluation for nausea, vomiting, bloody diarrhea, chest pain     Hospital Course:  46 yo BF with PMHx ESRD on HD MWF via L chest permacath (anuric; non-functioning LUE AVF/graft), HTN, OSA on cpap, history of Cdiff, asthma on  home O2, CAD s/p stents in 2010 who was transferred from [place] to [place] on 02/18/17 for further evaluation. He initially presented with nausea, vomiting, bloody diarrhea, abdominal pain, and chest pain. He was found to be anemic and with elevated troponin. He missed his HD session on Wednesday. Nephrology and Cardiology were consulted. He received 1 unit PRBC (pre-HD). He underwnet bedside HD session 11/2. Cardiology was consulted for elevated trop - echo done (EF 35%, grade 2DD), trop trended (it was 0.12 x2), recommed resume home BP meds. Inpatient stress test was planned for Monday, 02/21/17. Additionally Gi was consulted - recommend PO vancomycin, f/u stool Cdiff test, and plans for inpatient fecal transplant once finished with cardiac work up.     Of note, patient was very difficult since his admission to the PCCU. He initially refused 2D echo. He refused to have bedside HD until he was allowed to eat. Cardiology came by and because no cardiac testing today - patient could eat. He was allowed to eat and then allowed HD to be done. He refused his blood pressure medications (SBP 220s+). Ultimately, he demanded from RNs to received dilaudid 1mg  IV - he did not want any lower dose or would leave. He had been given dilaudid 0.2mg  IV previously. Of note  during rounds with team in the AM - patient was drowsy and falling asleep during exam - this is why dilaudid 0.2mg  IV dose was selected. To address his pain, dilaudid 0.2 was increased to 0.5mg  - as that is more than double the dose, with plans for more HD in the upcoming days, with noted somnolence, and concern for drug seeking and manipulative behavior. As patient only wanted dilaudid 1mg  and we wouldn't give that to him, he left AMA. He was Full Code    Discharge Day Exam:  Temp:  [97.2 F (36.2 C)-98.4 F (36.9 C)] 98.4 F (36.9 C)  Heart Rate:  [80-110] 109  Resp Rate:  [18-24] 18  BP: (139-227)/(84-117) 208/104    Wounds/decutibus ulcers/stage: left chest permacath    Consultations:     Treatment Team:   Resident: Pollie Meyer, MD  Consulting Physician: Gentry Roch, MD  Consulting Physician: Jerral Ralph, MD  Resident: Tommy Medal, MD  Resident: Ulla Gallo, MD  Resident: Nevada Crane, MD  Consulting Physician: Thomas Hoff, MD  Consulting Physician: Lestine Mount, MD  Consulting Physician: Modlinger, Ollen Gross, MD    Discharge Condition:     Poor, high risk for readmission     Discharge Instructions & Follow Up Plan for Patient:     Diet: renal / cardiac    Activity/Weight Bearing Status: light, advance as tolerated      Patient was instructed to follow up with:     Follow-up Information     [facility] Follow up.    Contact information:  [address]  [place] [address]  [phone]           Modlinger, Ollen Gross, MD Follow up.    Specialties:  Nephrology, Internal Medicine  Contact information:  [address]  [address] [address]  [phone]             Niel Hummer, MD Follow up.    Specialty:  Gastroenterology  Contact information:  [address]  [place] [address]  [phone]             Jonetta Speak, MD Follow  up.    Specialties:  Cardiology, Internal Medicine  Contact information:  [address]  [address]  [place] [address]  [phone]                     Discharge Code Status: Full Code  Patient Emergency Contact: NONE LISTED    Complete instructions and follow up are in the patient's After Visit Summary    Minutes spent coordinating discharge and reviewing discharge plan:50 minutes    Discharge Medications:        Discharge Medication List      Taking    amLODIPine 10 MG tablet  Dose:  10 mg  Commonly known as:  NORVASC  Take 10 mg by mouth daily.     calcium acetate 667 MG capsule  Dose:  1334 mg  Commonly known as:  PHOSLO  Start taking on:  02/19/2017  Take 2 capsules (1,334 mg total) by mouth 3 (three) times daily with meals.     carvedilol 12.5 MG tablet  Dose:  12.5 mg  Commonly known as:  COREG  Take 12.5 mg by mouth.     darbepoetin alfa 60 MCG/0.3ML injection  Dose:  60 mcg  Commonly known as:  ARANESP  Start taking on:  02/25/2017  Inject 0.3 mLs (60 mcg total) into the skin once a week.     furosemide 80 MG tablet  Dose:  80 mg  Commonly known as:  LASIX  Take 80 mg by mouth daily.     hydrALAZINE 100 MG tablet  Dose:  100 mg  Commonly known as:  APRESOLINE  Take 100 mg by mouth 3 (three) times daily.     sodium chloride 0.9 % SOLN 40 mL with albuterol (5 MG/ML) 0.5% NEBU 2.5 mg/mL  Inhale into the lungs as needed.     vancomycin 125 MG capsule  Dose:  125 mg  Commonly known as:  VANCOCIN HCL  Take 1 capsule (125 mg total) by mouth 4 (four) times daily.for 10 days        STOP taking these medications    RENAGEL 800 MG tablet  Generic drug:  sevelamer              (FYI: you must refresh the link after final D/C Med reconciliation)    Immunizations provided:         [facility]  Department of Medicine  P: [phone]  F: [phone]    Signed by: Jerral Ralph, MD    CC: No primary care provider on file.

## 2017-02-18 NOTE — Plan of Care (Addendum)
Problem: Safety  Goal: Patient will be free from injury during hospitalization  Outcome: Progressing   02/18/17 0248   Goal/Interventions addressed this shift   Patient will be free from injury during hospitalization  Assess patient's risk for falls and implement fall prevention plan of care per policy;Provide and maintain safe environment;Use appropriate transfer methods;Ensure appropriate safety devices are available at the bedside;Include patient/ family/ care giver in decisions related to safety;Hourly rounding;Assess for patients risk for elopement and implement Elopement Risk Plan per policy;Provide alternative method of communication if needed (communication boards, writing)     Goal: Patient will be free from infection during hospitalization  Outcome: Progressing   02/18/17 0248   Goal/Interventions addressed this shift   Free from Infection during hospitalization Assess and monitor for signs and symptoms of infection;Monitor lab/diagnostic results;Monitor all insertion sites (i.e. indwelling lines, tubes, urinary catheters, and drains)       Problem: Pain  Goal: Pain at adequate level as identified by patient  Outcome: Progressing   02/18/17 0248   Goal/Interventions addressed this shift   Pain at adequate level as identified by patient Identify patient comfort function goal;Assess for risk of opioid induced respiratory depression, including snoring/sleep apnea. Alert healthcare team of risk factors identified.;Assess pain on admission, during daily assessment and/or before any "as needed" intervention(s);Reassess pain within 30-60 minutes of any procedure/intervention, per Pain Assessment, Intervention, Reassessment (AIR) Cycle;Evaluate if patient comfort function goal is met;Offer non-pharmacological pain management interventions;Include patient/patient care companion in decisions related to pain management as needed       Problem: Hemodynamic Status: Cardiac  Goal: Stable vital signs and fluid  balance  Outcome: Progressing   02/18/17 0248   Goal/Interventions addressed this shift   Stable vital signs and fluid balance Monitor/assess vital signs and telemetry per unit protocol;Weigh on admission and record weight daily;Assess signs and symptoms associated with cardiac rhythm changes;Monitor intake/output per unit protocol and/or LIP order;Monitor lab values;Monitor for leg swelling/edema and report to LIP if abnormal       Comments: Pt is A&O x4. SR In tele, HR 80's - 90's,SBP 130's - 150's, is on 3L NC for comfort satting WNL. Pt,, headache, dizziness, SOB. Pt is in bed lying, bed in lowest position, bed alarm is on, call bell and side table within pt's reach. Fall mat in place Safety and Fall precautions maintained. Pt is a x1 assist to the bathroom. Pt's needs addressed and questions answered. Pt is anuric Dialysis pt (MWF). 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 Per patient no fistula in the R arm ok to use for blood work and BP. Informed MD MVHQIO and agreed ok to use R arm. EKG completed see chart. Chest xray completed this shift. Awaiting for results.      Addendum:  Pt complaining of 10/10 C.pain and Abdominal pain. Paged attending ordered Nitro gtt but pt refusing to take nitro gtt per pt having headaches. Explained importance still does not want to take nitro gtt informed MD NGEXBM. Per MD WUXLKG will hold off w/ nitro gtt for now.  Pt refuse to get a new IV stated has already 2 IV's present. Per MD MWNUUV ok to keep old IV from OSH.  0410: Pt's H/H came back 7.1/23. Paged MD OZDGUY regarding results. Ordered 1 unit of RBC to be transfused. Informed consent done by MD QIHKVQ and witness by this RN.  0522: BT started dual RN signed and verified w/ Rena RN started w/  50cc. VS done Q5 x3 no reaction noticed. Will continue to monitor pt.  1610: Attempted to give PO meds to pt. But pt stated won't take meds for now and will hold off. MD Aundria Rud aware.  9604:  Increased rate to 120 w/ Clare Gandy. VSS  0610: MD Aundria Rud ordered STAT labs. RN and Tech tried to get STAT labs pt refusing even after explaining purpose. Paged MD Aundria Rud.  5409: Spoke to pt regarding taking meds per pt won't take any meds and will hold off until he gets HD.      Plan:  NPO  Cards, GI, ID, and Nephro Consult.  Trend trops.  Stool sample needed.  PT/OT ordered.  CT Abd ordered.        2 staff member skin assessment completed by Darliss Cheney and Gwendolyn Lima RN  CHG bath: Completed  Clinical cytogeneticist signed by patient/staff: Completed  Admission packet reviewed with patient/family: Completed  Reviewed patient's belongings and home medications: Completed   Surveillance MRSA culture (SNF/Rehab/History): N/A  Existing lines/Foley evaluated: Yes  Arm bands applied: Yes  Interpreter waiver completed: N/A

## 2017-02-18 NOTE — Student Progress (Signed)
MEDICINE STUDENT PROGRESS NOTE    Date Time: 02/18/17 6:40 AM  Patient Name: [name]  Attending Physician: Jerral Ralph, MD    Subjective   CC: Chest pain and diarrhea    Subjective: Worsening chest pain. Has had bloody diarrhea and vomiting, with immense nausea. He continues to be hungry and insists on eating.      Physical Exam:     VITAL SIGNS PHYSICAL EXAM   Temp:  [97.4 F (36.3 C)-98.1 F (36.7 C)] 97.6 F (36.4 C)  Heart Rate:  [81-84] 83  Resp Rate:  [19-24] 19  BP: (139-178)/(95-109) 178/107        No intake or output data in the 24 hours ending 02/18/17 0640 General: drowsy and calm; only able to assess orientation to setting and person  Lungs: CTAB  Cardiovascular: regular rate and rhythm with normal S1 and S2 with no rubs, murmurs, or gallops  Abdomen: patient refused due to extreme pain  Extremities: bilateral pitting edema in lower extremities          Meds (essential medications - list Abx days, group by class):     Current Facility-Administered Medications   Medication Dose Route Frequency   . amLODIPine  10 mg Oral Daily   . calcium GLUConate  1 g Intravenous Q1H   . carvedilol  12.5 mg Oral Q12H SCH   . heparin (porcine)  5,000 Units Subcutaneous Q8H SCH   . hydrALAZINE  100 mg Oral TID   . HYDROmorphone  1 mg Intravenous Once   . sevelamer  800 mg Oral TID MEALS   . vancomycin  125 mg Oral QID     Current Facility-Administered Medications   Medication Dose Route Frequency Last Rate     Current Facility-Administered Medications   Medication Dose Route   . sodium chloride   Intravenous   . acetaminophen  650 mg Oral    Or   . acetaminophen  650 mg Rectal   . albuterol  2.5 mg Nebulization   . labetalol  20 mg Intravenous   . naloxone  0.2 mg Intravenous         Labs:     Labs (last 72 hours): (ensure neat format, include any other labs not listed below)       Recent Labs  Lab 02/18/17  0237   WBC 7.19   Hgb 7.1*   Hematocrit 23.0*   Platelets 143         Recent Labs  Lab  02/18/17  0237   PT 16.3*   PT INR 1.3*   PTT 36      Recent Labs  Lab 02/18/17  0237   Sodium 136   Potassium 6.2*   Chloride 104   CO2 15*   BUN 81.0*   Creatinine 13.7*   Calcium 6.2*   Albumin 3.0*   Protein, Total 6.5   Bilirubin, Total 0.6   Alkaline Phosphatase 289*   ALT 18   AST (SGOT) 30   Glucose 80              Hgb is at 7.1 - could not find baseline  Potassium elevated at 6.2  Calcium low 6.2  Last trop was 0.12  BUN/Cr = 6.23  BNP elevated at 2932      Microbiology (update daily)  C diff toxin screening     Imaging (summarize)  X-ray of abdomen: no obstructive patterns; no acute abnormalities per radiology's impression    Chest x-ray  is pending    EKG unremarkable with normal sinus rhythm    Assessment (brief and succinct one liner with diagnoses, not symptoms):     [name] is a 46 yo male with a PMH of MI in 2010 w/ 2 stents, ESRD, and C diff infection, who presents with acute onset of left sided chest pain with radiation to jaw w/ concerns for NSTEMI secondary to demand iscehmia as well as 4 day history of febrile bloody diarrhea with concerns for repeat C diff infection vs lower GI bleed.    Plan (by active issues, prioritized.  Can just list the inactive issues):   #NSTEMI w/ Chest pain  -Continue to trend troponin for peak  -Troponin could continue to stay elevated given poor renal function  -is receiving dilaudid 1 mg IV prn Q4 hours  -troponin will be monitored Q4 hours until peak  -nitroglycerin prn 2.5 mg prn for chest pain  -heparin 5000 units q8 hours  -already on beta blocker  -platelet count is okay - start clopidogrel since patient has aspirin allergy of angioedema  -Cardiology has been consulted  -Repeat EKG has been ordered      #Hypertensive emergency  -Systolic of 233 at outside hospital  -Received labetalol IV prn  -continue amlodipine 10 mg q daily, carvidilol 12.5 mg q 12 hours, and hydralazine 100 mg tid    #Hyperkalemia  -currently elevated at 6.2  -trend  -already received  calcium gluconate  -insulin and D50 to move potassium into cells  -if no response while trending labs, can try kayexalate   -this could also correct with his dialysis    #Hypocalcemia  -Has received calcium chloride  -For future calcium concerns, calcium gluconate may be enough as it can replete the calcium and act to stabilize the heart given patient's hyperkalemia likely secondary to ESR.    #Normocytic anemia  -Hgb at 7.1  -got one unit of RBC's transfused  -likely secondary to anemia of chronic disease  --Iron profile is classic of anemia of chronic disease - elevated Ferritin and TIBC low  -Would like nephrology's input - I feel that the patient may benefit from Epogen as he likely lives at a chronically borderline anemia of 7  -transfuse at Hgb < 7      #Bloody diarrhea  -likely C diff given hx of infection, bloody diarrhea 7-8 times daily, lower abdominal pain and febrile, less likely given no elevated WBC  -likely lower GI bleed given amount of blood described  -GI consulted for fecal transplant - should also speak to GI about bloody emesis; ID consulted for recurrence of C diff  -continue Vancomycin which was started overnight       #ESRD  -continue furosemide 80 mg po q daily  -continue sevelamer (to lower phosphate) po TID  -Nephrology consult         #OSA  #Asthma      Disposition (what needs to be accomplished before the patient returns home):   -investigate if there is a GI bleeding and rule out ACS  -dialysis completed with electrolytes back to baseline    Signed by: Audie Clear, BS

## 2017-02-18 NOTE — Consults (Signed)
[place] [facility] CARDIOLOGY CONSULTATION REPORT  [facility]    Date Time: 02/18/17 1:51 PM  Patient Name: [name]  Requesting Physician: Jerral Ralph, MD       Reason for Consultation:   Elevated troponin    History:   [name] is a 46 y.o. male admitted on 02/18/2017.  We have been asked by Jerral Ralph, MD,  to provide cardiac consultation, regarding elevated troponin.     [name] has a history of ESRD on HD, HTN, and CAD with reported 2 stents at [facility] in 2010. He tells me that he never followed up with cardiology. He has been on HD for the last 7 years. He states that he had pneumonia followed by diarrhea. He states he was not feeling well enough to go to HD so he missed his session on Wednesday. He states that yesterday he began to experience left sided chest pain and nausea. He states he "may have been diaphorectic." The patient repoerts that the symptoms which have been continuous for >18 hours is similar to discomfort he had at time of MI. Discomfort is non-pleuritic and non-positional.    He presented to [facility]. He was noted to have a troponin on 0.126. He was transferred here for further care. His troponin here was 0.12    He has a K of 6.2 and is approximately 30 lbs above his dry weight. He desperately needs HD however he is currently refusing as he has been NPO awaiting our evaluation. He states he will not have HD until he eats.     Past Medical History:     Past Medical History:   Diagnosis Date   . Asthma    . Clostridium difficile infection    . ESRD (end stage renal disease) on dialysis     HD since 2011   . Hypertension    . Myocardial infarction 2010    2 stents       Past Surgical History:   History reviewed. No pertinent surgical history.    Family History:     Family History   Problem Relation Age of Onset   . Hypertension Mother        Social History:     Social History     Social History   . Marital status: N/A     Spouse name: N/A   .  Number of children: N/A   . Years of education: N/A     Social History Main Topics   . Smoking status: Current Every Day Smoker     Packs/day: 1.00     Years: 20.00   . Smokeless tobacco: Never Used   . Alcohol use No   . Drug use: Unknown   . Sexual activity: Not on file     Other Topics Concern   . Not on file     Social History Narrative   . No narrative on file       Allergies:     Allergies   Allergen Reactions   . Asa [Aspirin] Angioedema     Facial, lip swelling and throat tightness   . Lisinopril    . Morphine      Tolerated dilaudid 11//02/18   . Toradol [Ketorolac Tromethamine]        Medications:     Prescriptions Prior to Admission   Medication Sig   . amLODIPine (NORVASC) 10 MG tablet Take 10 mg by mouth daily.   . carvedilol (COREG)  12.5 MG tablet Take 12.5 mg by mouth.   . furosemide (LASIX) 80 MG tablet Take 80 mg by mouth daily.   . hydrALAZINE (APRESOLINE) 100 MG tablet Take 100 mg by mouth 3 (three) times daily.   . sevelamer (RENAGEL) 800 MG tablet Take 800 mg by mouth 3 (three) times daily with meals.   . sodium chloride 0.9 % SOLN 40 mL with albuterol (5 MG/ML) 0.5% NEBU 2.5 mg/mL Inhale into the lungs as needed.        Current Facility-Administered Medications   Medication Dose Route Frequency   . amLODIPine  10 mg Oral Daily   . calcium acetate  1,334 mg Oral TID MEALS   . carvedilol  12.5 mg Oral Q12H SCH   . darbepoetin alfa  60 mcg Subcutaneous Weekly   . heparin (porcine)  5,000 Units Subcutaneous Q8H SCH   . hydrALAZINE  100 mg Oral TID   . HYDROmorphone  0.5 mg Intravenous Once   . vancomycin  125 mg Oral QID         Review of Systems:    Comprehensive review of systems including constitutional, eyes, ears, nose, mouth, throat, cardiovascular, GI, GU, musculoskeletal, integumentary, respiratory, neurologic, psychiatric, and endocrine is negative other than what is mentioned already in the history of present illness    Physical Exam:     VITAL SIGNS PHYSICAL EXAM   Vitals:    02/18/17  1100   BP: (!) 168/101   Pulse: 80   Resp: 19   Temp: 97.2 F (36.2 C)   SpO2: 95%     Temp (24hrs), Avg:97.7 F (36.5 C), Min:97.2 F (36.2 C), Max:98.1 F (36.7 C)      Intake and Output Summary (Last 24 hours) at Date Time    Intake/Output Summary (Last 24 hours) at 02/18/17 1351  Last data filed at 02/18/17 1245   Gross per 24 hour   Intake                0 ml   Output                0 ml   Net                0 ml       Telemetry: no changes Physical Exam  General: awake, alert, breathing comfortably, no acute distress  Head: normocephalic  Eyes: EOM's intact  Cardiovascular: regular rate and rhythm, normal S1, S2, no S3, no S4, no murmurs, rubs or gallops  Neck: +JVD  Lungs: Diminished in bases  Abdomen: soft, non-tender, non-distended; no palpable masses,  normoactive bowel sounds  Extremities:2+ BLE with dry skin  Pulse: equal pulses, 4/4 symmetric  Neurological: Alert and oriented X3, mood and affect normal  Musculoskeletal: normal strength and tone     Labs Reviewed:     Recent Labs  Lab 02/18/17  0237   Troponin I 0.12*           Recent Labs  Lab 02/18/17  0237   Cholesterol 153   Triglycerides 69   HDL 58   LDL Calculated 81       Recent Labs  Lab 02/18/17  0237   Bilirubin, Total 0.6   Protein, Total 6.5   Albumin 3.0*   ALT 18   AST (SGOT) 30       Recent Labs  Lab 02/18/17  0237   Magnesium 1.6       Recent Labs  Lab  02/18/17  0237   PT 16.3*   PT INR 1.3*   PTT 36       Recent Labs  Lab 02/18/17  0237   WBC 7.19   Hgb 7.1*   Hematocrit 23.0*   Platelets 143       Recent Labs  Lab 02/18/17  0237   Sodium 136   Potassium 6.2*   Chloride 104   CO2 15*   BUN 81.0*   Creatinine 13.7*   EGFR 3.9   Glucose 80   Calcium 6.2*       DIAGNOSTIC    EKG: NSR, no acute ischemic changes    chest X-ray   Cardiomegaly with minimal bibasilar atelectasis.    Assessment:    Presented to [facility] hospital with chest pain - likely due to volume overload in the setting of missed HD   Elevated troponin 0.12 - demand in  the setting of ESRD, volume overload, anemia, HTN   Hx CAD with report of stents x 2 (unknown anatomy) at [facility] in 2010 with no cardiology follow up since   ESRD on HD - missed session on Wednesday   Fluid overload secondary to missed HD   HTN - SBP 233 on arrival - noncompliance with meds x 4 days due to N/V   Hyperkalemia   Anemia   Bloody diarrhea   Hx recurrent C-diff    Recommendations:    Please feed patient as he desperately needs HD - discussed with nursing   Echo pending   Trend troponins   Resume home BP meds - amlodipine, carvedilol, hydralazine   He cites an allergy to ASA   Will attempt to obtain records from [facility] in 2010   As there is a chance he will not follow up as an outpatient will tentatively plan for Lexi NST on Monday 02/21/17            Signed by: Queen Slough, NP    I saw and examined the patient. My exam and finding duplicated as documented in the note above on [name].  I have personally edited and added to the text of this note to reflect my exam and medical assessment and plan.  I have personally reviewed the patient's history and 24 hour interval events, along with vitals, labs, radiology images, and additional findings found in detail with in the note above.  I concur with the above documented assessment and care plan which was developed with and reviewed by me.    Jonetta Speak MD      [facility]  NP Spectralink [phone] (8am-5pm)  MD Spectralink [phone] or [phone] (8am-5pm)  Arrhythmia Spectralink [phone] (8am-4:30pm)  After hours, non urgent consult line [phone]  After Hours, urgent consults [phone]

## 2017-02-18 NOTE — Progress Notes (Signed)
Chest pain and diarrhea     02/18/17 1506   Patient Type   Within 30 Days of Previous Admission? No   Healthcare Decisions   Interviewed: Patient   Orientation/Decision Making Abilities of Patient Alert and Oriented x3, able to make decisions   Advance Directive Patient does not have advance directive   Healthcare Agent Appointed No   Prior to admission   Prior level of function Independent with ADLs;Ambulates independently   Type of Residence Private residence   Home Layout One level   Have running water, electricity, heat, etc? Yes   Living Arrangements Alone   How do you get to your MD appointments? Self   How do you get your groceries? Self   Who fixes your meals? Self   Who does your laundry? Self   Who picks up your prescriptions? Self   Dressing Independent   Grooming Independent   Feeding Independent   Bathing Independent   Toileting Independent   DME Currently at Home (No DME in use)   Home Care/Community Services (No home health services)   Adult Protective Services (APS) involved? No   Discharge Planning   Support Systems Family members   Patient expects to be discharged to: home   Anticipated [place] plan discussed with: Same as interviewed   Mode of transportation: Private car (friend)   Consults/Providers   Outcome Palliative Care Screen Screened but did not meet criteria for intervention   Correct PCP listed in Epic? Yes   Important Message from [facility] Notice   Patient received 1st IMM Letter? n/a

## 2017-02-18 NOTE — UM Notes (Signed)
02/18/17 0157  Admit to Inpatient      Systemic or Infectious Condition [facility]G - Clinical Indications for Admission to Inpatient Care    02/18/2017 Initial Inpatient Review    Transferred from OSH   46 y.o. male with PMH of HTN, ESRD on HD, MI (2010) s/p 2 stents, asthma, OSA presenting with chest pain, bloody diarrhea, N/V.Presented to [place] ED on 02/17/17 evening with L sided chest pain, pressure-like, radiating to jaw that started earlier in the day. For the past 4 days he has had bloody diarrhea 7-8 times per day with fever Tmax 101F and lower abdominal pain bilaterally. Also with emesis 4-5x a day non bloody non bilious. Recently completed course of antibiotics for pneumonia. 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 History of ESRD on HD MWF, missed his dialysis this past Wednesday, baseline Hb around 9. Has not taken any of his medications for past 4 days as he has been unable to keep anything down.  At [place], BP was 233/139, HR 100s, he was noted to have Hb 7.1, MCV 96, Calcium 5.3.  He received SL nitroglycerin x3, Dilaudid 1mg  IV x2, and still had chest pain 10/10. Started on nitroglycerin gtt @ 39mcg/min then titrated up to 29mcg/min. On arrival here, vitals were HR 80s, BP 139/95, O2 100 on 3L O2, afebrile. Endorses persistent chest pain, N/V, and abdominal pain. States the nitroglycerin gtt made his head ache.    Pt admit to [facility]    ZO:XWRUEA (non-ST elevated myocardial infarction) [I21.4]  Chest pain [R07.9]     VS: 172/109, 82, 24, 98.1, 98% on 3L    Contact isolation    Abn labs:BUN 81, Cr 13.7, potassium 6.2, co2 15, ca 6.2, alb 3.0, alk phos 289, troponin 0.12, H/H 7.1/23, PT/INR 16.3/1.3, BNP 2932, ferritin 2386.91    X ray chest:Cardiomegaly with minimal bibasilar atelectasis.    MD plan  - Consult ID, GI regarding hematochezia  - Consult nephro for iHD today; should help with BP  - OSH in contact with Dr. Melrose Nakayama -- will formally  consult in AM  - Trend trop to peak  - Obtain TTE  - Restart home antihypertensives, IV labetalol PRN to target SBP < 140 with active pain  - F/u C diff, empirically treating with oral vancomycin in interim  - F/u lactate, non-contrast CT imaging of the abdomen  - Trend CBC, would transfuse slowly to Hb > 8 given active chest pressure  - Continue to monitor/supplement calcium and magnesium, especially as he is being transfused blood  - Repeat ECG to reassess QT  - Avoid narcotics as able given active colitis    Nephrology consult  1. HD today and tomorrow. 1 K bath  2. Aggressive fluid removal  3. Start aranesp  4. Check phos  5. Check PTH  6. Change renvela to phos-lo  7. No need IV mag or addition IV calcium. Will put on high calcium dialysate bath today and tomorrow.    Pending ID, GI consult    Scheduled Meds:  Current Facility-Administered Medications   Medication Dose Route Frequency   . amLODIPine  10 mg Oral Daily   . calcium acetate  1,334 mg Oral TID MEALS   . carvedilol  12.5 mg Oral Q12H SCH   . darbepoetin alfa  60 mcg Subcutaneous Weekly   . heparin (porcine)  5,000 Units Subcutaneous Q8H SCH   . hydrALAZINE  100 mg  Oral TID   . HYDROmorphone  0.5 mg Intravenous Once   . vancomycin  125 mg Oral QID     Continuous Infusions:  PRN Meds:.acetaminophen **OR** acetaminophen, albumin human, albuterol, HYDROmorphone HCl, labetalol, naloxone, sodium chloride, sodium chloride     Amaryllis Dyke, RN MSN  Utilization Review  Case Management Department  [phone]  [facility]

## 2017-02-18 NOTE — H&P (Signed)
ADMISSION HISTORY AND PHYSICAL EXAM    Date Time: 02/18/17 4:46 AM  Patient Name: [facility]  Attending Physician: Mayer Masker, MD  Primary Care Physician: No primary care provider on file.    CC: Chest pain, hypertension    Assessment:   Active Problems:    Chest pain      [name] is a 46 y.o. male with PMH of HTN, ESRD on HD, MI (2010) s/p 2 stents, presenting with chest pain, recent bloody diarrhea, found to have anemia and hypertensive emergency, suspect demand ischemia in setting of recent bloody diarrhea likely secondary to C. difficile infection.    Plan:   #Chest pain, suspect demand ischemia secondary to worsening anemia from blood loss  - Dilaudid 1mg  IV PRN Q4H for pain control  - Troponin Q4H x2 or until peak  - Cardiology consult [facility] in AM  - Dilaudid 1mg  IV PRN Q2H for severe pain, avoid morphine given ESRD  - Ordered 1U RBC for Hb 7.1    #/Hypertensive Emergency: BP 233 at OSH. In setting of medication non-adherence for past 4 days due to significant N/V  - s/p Nitroglycerin gtt for chest pain and BP control at OSH  - Case discussed with Cardiology Dr. Melrose Nakayama [facility] prior to transfer  - Labetalol IV prn for hypertension  - BP now better controlled, resume home meds    #Anemia, normocytic: Baseline Hb around 9. Hb on admission 7.1, MCV 97. Suspect secondary to anemia of chronic kidney disease, iron deficiency, and GI blood loss  - Will check iron profile and ferritin, B12, folate  - Type & screen ordered and consented  - Transfuse for Hb <7  - Will order 1U RBCs for now    #Diarrhea, bloody: For past 4 days, 7-8x/day. With history of recurrent C. Diff  - C. Diff PCR ordered  - Empirically start Vancomycin PO QID x 10 days  - GI consulted left voicemail for possible FMT  - ID consult for recurrent C. difficile infection  - Check XR Abdomen to assess for ileus/megacolon    #ESRD on HD MWF:   - Continue Sevelamer 800mg  PO TID AC  - Continue Furosemide 80mg  PO QDaily  -  Nephrology consult for HD, placed order in Epic  - Calcium gluconate 1g for hyperkalemia    #HTN: home regimen is Amlodipine 10mg  QDaily, Carvedilol 12.5mg  Q12H, Hydralazine 100mg  TID    #OSA  - CPAP QHS    #Asthma: on 2-3L home O2  - Albuterol nebs PRN    Code Status: Full Code    DVT Prophylaxis: Heparin SQ, SCDs    F: As above  E: Replete for K<4, Mg<2  N: Diet NPO effective now    Disposition: (Please see PAF column for Expected D/C Date)   Today's date: 02/18/2017  Admit Date: 02/18/2017  1:40 AM  Anticipated medical stability for discharge:Red - not tomorrow - estimated month/date: TBD    History of Presenting Illness:   [name] is a 46 y.o. male with PMH of HTN, ESRD on HD, MI (2010) s/p 2 stents, asthma, OSA presenting with chest pain, bloody diarrhea, N/V.    Presented to [place] ED on 02/17/17 evening with L sided chest pain, pressure-like, radiating to jaw that started earlier in the day. For the past 4 days he has had bloody diarrhea 7-8 times per day with fever Tmax 101F and lower abdominal pain bilaterally. Also with emesis 4-5x a day non bloody non bilious.  Recently completed course of antibiotics for pneumonia. 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 History of ESRD on HD MWF, missed his dialysis this past Wednesday, baseline Hb around 9. Has not taken any of his medications for past 4 days as he has been unable to keep anything down.    At [place], BP was 233/139, HR 100s, he was noted to have Hb 7.1, MCV 96, Calcium 5.3.  He received SL nitroglycerin x3, Dilaudid 1mg  IV x2, and still had chest pain 10/10. Started on nitroglycerin gtt @ 30mcg/min then titrated up to 51mcg/min.     On arrival here, vitals were HR 80s, BP 139/95, O2 100 on 3L O2, afebrile. Endorses persistent chest pain, N/V, and abdominal pain. States the nitroglycerin gtt made his head ache.      PCP: No primary care provider on file.  Code Status: Full Code     Past Medical History:     Past Medical History:   Diagnosis Date   . Asthma    . Clostridium difficile infection    . ESRD (end stage renal disease) on dialysis     HD since 2011   . Hypertension    . Myocardial infarction 2010    2 stents       Available old records reviewed, including: Notes, labs, imaging    Past Surgical History:   History reviewed. No pertinent surgical history.    Family History:     Family History   Problem Relation Age of Onset   . Hypertension Mother        Social History:     History   Smoking Status   . Current Every Day Smoker   . Packs/day: 1.00   . Years: 20.00   Smokeless Tobacco   . Never Used     History   Alcohol Use No     History   Drug use: Unknown       Allergies:     Allergies   Allergen Reactions   . Asa [Aspirin] Angioedema     Facial, lip swelling and throat tightness   . Lisinopril    . Morphine    . Toradol [Ketorolac Tromethamine]        Medications:     Home Medications     Med List Status:  In Progress Set By: Rowland Lathe, RN at 02/18/2017  1:57 AM                amLODIPine (NORVASC) 10 MG tablet     Take 10 mg by mouth daily.     carvedilol (COREG) 12.5 MG tablet     Take 12.5 mg by mouth.     furosemide (LASIX) 80 MG tablet     Take 80 mg by mouth daily.     hydrALAZINE (APRESOLINE) 100 MG tablet     Take 100 mg by mouth 3 (three) times daily.     sevelamer (RENAGEL) 800 MG tablet     Take 800 mg by mouth 3 (three) times daily with meals.     sodium chloride 0.9 % SOLN 40 mL with albuterol (5 MG/ML) 0.5% NEBU 2.5 mg/mL     Inhale into the lungs as needed.          Method by which medications were confirmed on admission: Verbally, in person    Review of Systems:   All other systems were reviewed and are negative  except: See HPI    Physical Exam:     Patient Vitals for the past 24 hrs:   BP Temp Temp src Pulse Resp SpO2 Height Weight   02/18/17 0230 (!) 156/98 97.7 F (36.5 C) Oral 84 19 98 % - -   02/18/17 0140 (!) 139/95 98.1 F (36.7 C) Oral 82 (!) 24 100  % 1.854 m (6\' 1" ) 106.4 kg (234 lb 9.6 oz)     Body mass index is 30.95 kg/m.  No intake or output data in the 24 hours ending 02/18/17 0446    General: Awake, alert. Appears uncomfortable.  HEENT: PERRLA, EOMI. No conjunctival pallor or icterus. MMM.  Cardio: RRR. Normal S1/S2. No M/R/G. +JVD.   Resp: Non-labored breathing. Lungs CTAB. No crackles, wheezes, rhonchi.   Abd: Distended, significant TTP diffusely. Normal bowel sounds. Mild guarding. No HSM.  Ext: Radial and pedal pulses +2 bilaterally. +2 pedal edema bilaterally non-pitting.  Neuro: AOx3. CN 2-12 grossly intact.  Skin: No rashes or lesions noted.    Labs:     Results     Procedure Component Value Units Date/Time    Lipid panel [[id number]] Collected:  02/18/17 0237    Specimen:  Blood Updated:  02/18/17 0440     Cholesterol 153 mg/dL      Triglycerides 69 mg/dL      HDL 58 mg/dL      LDL Calculated 81 mg/dL      VLDL Cholesterol Cal 14 mg/dL      CHOL/HDL Ratio 2.6    Hemolysis index [[id number]] Collected:  02/18/17 0237     Updated:  02/18/17 0440     Hemolysis Index 3    IRON PROFILE [[id number]]  (Abnormal) Collected:  02/18/17 0237     Updated:  02/18/17 0440     Iron 132 ug/dL      UIBC 97 (L) ug/dL      TIBC 962 (L) ug/dL      Iron Saturation 58 (H) %     Hemoglobin A1C [[id number]] Collected:  02/18/17 0237    Specimen:  Blood Updated:  02/18/17 0423    Type and Screen [[id number]] Collected:  02/18/17 0237    Specimen:  Blood Updated:  02/18/17 0400     ABO Rh O POS     AB Screen Gel NEG    Lipase [[id number]] Collected:  02/18/17 0237     Updated:  02/18/17 0345     Lipase 49 U/L     PT AND APTT [[id number]]  (Abnormal) Collected:  02/18/17 0237     Updated:  02/18/17 0343     PT 16.3 (H) sec      PT INR 1.3 (H)     PT Anticoag. Given Within 48 hrs. None     PTT 36 sec     CBC with differential [[id number]]  (Abnormal) Collected:  02/18/17 0237    Specimen:  Blood from Blood Updated:  02/18/17 0337     WBC 7.19 x10 3/uL      Hgb 7.1 (L) g/dL       Hematocrit 84.1 (L) %      Platelets 143 x10 3/uL      RBC 2.37 (L) x10 6/uL      MCV 97.0 fL      MCH 30.0 pg      MCHC 30.9 (L) g/dL      RDW 16 (H) %      MPV 10.4 fL  Neutrophils 79.1 %      Lymphocytes Automated 6.3 %      Monocytes 8.9 %      Eosinophils Automated 4.7 %      Basophils Automated 0.6 %      Immature Granulocyte 0.4 %      Nucleated RBC 0.0 /100 WBC      Neutrophils Absolute 5.69 x10 3/uL      Abs Lymph Automated 0.45 (L) x10 3/uL      Abs Mono Automated 0.64 x10 3/uL      Abs Eos Automated 0.34 x10 3/uL      Absolute Baso Automated 0.04 x10 3/uL      Absolute Immature Granulocyte 0.03 x10 3/uL      Absolute NRBC 0.00 x10 3/uL     Troponin I [[id number]]  (Abnormal) Collected:  02/18/17 0237    Specimen:  Blood Updated:  02/18/17 0328     Troponin I 0.12 (H) ng/mL     Comprehensive metabolic panel [[id number]]  (Abnormal) Collected:  02/18/17 0237    Specimen:  Blood Updated:  02/18/17 0326     Glucose 80 mg/dL      BUN 78.2 (H) mg/dL      Creatinine 95.6 (H) mg/dL      Sodium 213 mEq/L      Potassium 6.2 (HH) mEq/L      Chloride 104 mEq/L      CO2 15 (L) mEq/L      Calcium 6.2 (L) mg/dL      Protein, Total 6.5 g/dL      Albumin 3.0 (L) g/dL      AST (SGOT) 30 U/L      ALT 18 U/L      Alkaline Phosphatase 289 (H) U/L      Bilirubin, Total 0.6 mg/dL      Globulin 3.5 g/dL      Albumin/Globulin Ratio 0.9    Magnesium [[id number]] Collected:  02/18/17 0237    Specimen:  Blood Updated:  02/18/17 0326     Magnesium 1.6 mg/dL     GFR [[id number]] Collected:  02/18/17 0237     Updated:  02/18/17 0326     EGFR 3.9    B-type Natriuretic Peptide [[id number]] Collected:  02/18/17 0237    Specimen:  Blood Updated:  02/18/17 0256    Clostridium difficile toxin B PCR [[id number]] Collected:  02/18/17 0237    Specimen:  Stool from Stool Updated:  02/18/17 0237    Narrative:       Patient's current admission began:->less than or equal to 3  days ago  Cancel C-Diff order if no diarrhea in 12 hours?->Yes  DELAY  AFFECTS RESULT          Microbiology Results     None          Imaging personally reviewed, including:     No results found.    Safety Checklist  DVT prophylaxis:  CHEST guideline (See page [id number]) Chemical   Foley:  [place] R[facility] Foley protocol Not present   IVs:  Peripheral IV   PT/OT: Ordered   Daily CBC & or Chem ordered:  SHM/ABIM guidelines (see #5) Yes, due to clinical and lab instability   Reference for approximate charges of common labs: CBC auto diff - $76  BMP - $99  Mg - $79    Signed by: Pollie Meyer, MD   cc:No primary care provider on file.

## 2017-02-18 NOTE — Procedures (Signed)
3.5 hours HD done per pt's request. Net UF 4700 ml. Unable to achieve the UF goal, 6L as ordered due to leg crams. BP was high throughout treatment, pt refused BP medicine. Pt was seen by Dr. Jacques Navy. Left chest permacath working well. Dressing changed. [place] and Curos caps placed. Pt c/o abdominal pain. Report given to primary RN. [place], Fleet Contras.

## 2017-02-18 NOTE — Consults (Signed)
[facility]  CONSULTATION NOTE  [facility] call: [phone], [phone]  [facility] call: [phone]  After hours call [phone]        Date Time: 02/18/17 4:37 PM  Patient Name: [name]  Requesting Physician: Jerral Ralph, MD       Reason for Consultation:   Recurrent clostridium difficile infection     Assessment and Plan:   Assessment:  1. Recurrent c.diff infection - Initial diagnosis in 04/2016 s/p flagyl and vancomycin with persistent diarrhea.   2. Hematochezia likely related to c.diff colitis   3. Elevated troponin thought to be related to demand ischemia   4. QT prolongation   5. Hypertensive emergency, improving   6. ESRD on HD. Missed last dialysis session.   7. History of CAD s/p PCI 2011     Plan:  1.  Recommend Vancomycin for now   2.  Can plan for Colonoscopy with FMT early next week - Mon vs Tues if cleared by cardiology   3.  Will need to be on clear liquid diet the day prior to procedure and we will order golytely preparation. Diet as tolerated today and tomorrow     History:   [name] is a 46 y.o. male who [facility] to [name] on [facility]18/2017 as transfer from OSH with chest pain, bloody diarrhea and hypertensive emergency. Patient was initially diagnosed with C.diff in January and at that time he believes he took flagyl and vancomycin. His symptoms have never fully resolved since that time but worsened after a recent course of antibiotics for PNA. He has now had bloody diarrhea x 1 week with a fever at home to Tmax 101. He reports stabbing diffuse abdominal pain, ~6-8 bloody loose BMs daily and nausea with vomiting. He reports being diagnosed with c.diff 15x in the past year.     Past Medical History:     Past Medical History:   Diagnosis Date   . Asthma    . Clostridium difficile infection    . ESRD (end stage renal disease) on dialysis     HD since 2011   . Hypertension    . Myocardial infarction 2010    2 stents       Past Surgical History:   History reviewed.  No pertinent surgical history.    Family History:     Family History   Problem Relation Age of Onset   . Hypertension Mother        Social History:     Social History     Social History   . Marital status: N/A     Spouse name: N/A   . Number of children: N/A   . Years of education: N/A     Social History Main Topics   . Smoking status: Current Every Day Smoker     Packs/day: 1.00     Years: 20.00   . Smokeless tobacco: Never Used   . Alcohol use No   . Drug use: Unknown   . Sexual activity: Not on file     Other Topics Concern   . Not on file     Social History Narrative   . No narrative on file       Allergies:     Allergies   Allergen Reactions   . Asa [Aspirin] Angioedema     Facial, lip swelling and throat tightness   . Lisinopril    . Morphine      Tolerated dilaudid 11//02/18   .  Toradol [Ketorolac Tromethamine]        Medications:     Current Facility-Administered Medications   Medication Dose Route Frequency   . amLODIPine  10 mg Oral Daily   . calcium acetate  1,334 mg Oral TID MEALS   . carvedilol  12.5 mg Oral Q12H SCH   . darbepoetin alfa  60 mcg Subcutaneous Weekly   . heparin (porcine)  5,000 Units Subcutaneous Q8H SCH   . hydrALAZINE  100 mg Oral TID   . vancomycin  125 mg Oral QID       Review of Systems:   General:  Patient denies lack of appetite, night sweats, weight loss, fatigue, fever.   HEENT:  Patient denies headache, hoarseness   Cardiovascular:  Patient denies swelling of hands/feet, fainting/blacking out, chest pain.   Respiratory:  Patient denies chronic cough, difficulty breathing, wheezing.   Genitourinary:  Patient denies blood in urine, dark urine  Musculoskeletal: Patient denies joint pain, joint stiffness, joint swelling.   Skin:  Patient denies itching, rash.   Neurologic:  Patient denies dizziness, loss of consciousness, fainting, confusion  Heme/Lymphatic:  Patient denies easy bruising.       Pertinent positives noted in HPI.    Physical Exam:     Vitals:    02/18/17 1630   BP:  (!) 199/113   Pulse: 97   Resp: 18   Temp:    SpO2: 100%       General appearance: Well developed, well nourished, appears stated age and in NAD  Eyes: Sclera anicteric, pink conjunctivae, no ptosis  ENMT: mucous membranes moist, nose and ears appear normal.  Oropharynx clear.  Chest: Non labored respirations, no audible wheezing, no clubbing or cyanosis  CV:  Regular rate and rhythm, no JVD, no LE edema  Abdomen: soft, + mild diffuse TTP, no guarding or rebound tenderness, mildly distended, no masses or organomegaly  Skin: Normal color and turgor, no rashes, no suspicious skin lesions noted  Neuro: CN II-XII grossly intact.  No gross movement disorders noted.  Mental status: Appropriate affect, alert and oriented x 3    Labs Reviewed:     Recent Labs      02/18/17   1331  02/18/17   0237   WBC   --   7.19   Hgb  7.8*  7.1*   Hematocrit  25.2*  23.0*   Platelets   --   143   MCV   --   97.0       Recent Labs      02/18/17   0237   Sodium  136   Potassium  6.2*   Chloride  104   CO2  15*   BUN  81.0*   Creatinine  13.7*   Glucose  80   Calcium  6.2*   Magnesium  1.6       Recent Labs      02/18/17   0237   AST (SGOT)  30   ALT  18   Alkaline Phosphatase  289*   Bilirubin, Total  0.6   Protein, Total  6.5   Albumin  3.0*       Recent Labs      02/18/17   0237   PTT  36   PT  16.3*   PT INR  1.3*        Radiology:   Radiological Procedure reviewed:    AXR 02/18/17:   No acute radiographic abnormality.

## 2017-02-18 NOTE — Plan of Care (Addendum)
Problem: Safety  Goal: Patient will be free from injury during hospitalization  Outcome: Progressing   02/18/17 1621   Goal/Interventions addressed this shift   Patient will be free from injury during hospitalization  Assess patient's risk for falls and implement fall prevention plan of care per policy;Provide and maintain safe environment;Use appropriate transfer methods;Ensure appropriate safety devices are available at the bedside;Include patient/ family/ care giver in decisions related to safety;Hourly rounding;Assess for patients risk for elopement and implement Elopement Risk Plan per policy       Problem: Hemodynamic Status: Cardiac  Goal: Stable vital signs and fluid balance  Outcome: Progressing   02/18/17 1621   Goal/Interventions addressed this shift   Stable vital signs and fluid balance Monitor/assess vital signs and telemetry per unit protocol;Weigh on admission and record weight daily;Assess signs and symptoms associated with cardiac rhythm changes;Monitor intake/output per unit protocol and/or LIP order;Monitor lab values       Comments: -Mobility: OOB stand by assist, pt is drowsy & on dilaudid   -Neuro: Pt A&Ox4, falls in and out of sleep during conversation   -Resp: SpO2 >95 on 2L NC   -CV: SR; HR 70-90s, SBP: 150-180s  -Diet: Cardiac Renal diet 50 grams protein restriction; 2 gram NA restriction; 1L fluid restriction  -GI: WNL   -GU: Watery diarrhea   -Pain: Abdominal pain dilaudid given; no complaints of CP, SOB, dizziness   -Safety: Has call light within reach, bed on lowest position, bed alarm on, Patient agrees to call for assist.     Pt remains hypertensive on shift, refusing BP medications, unable to attain CT, EKG due to lack of ability to lay flat, poor pain management: refusing oral Dilaudid only wanting IV Dilaudid, Internal med/residents made aware.

## 2017-02-18 NOTE — Progress Notes (Signed)
Pt Dow Adolph leaving AMA at this time. Pt unsatisfied with pain management regimen, refusing oral Dilaudid and only accepting 1mg  of IV Dilaudid. Refusing to take all meds including hypertensive meds despite BP as high as 227/117. Pt offered by attending Dr. Joni Fears, 0.5mg  IV Dilaudid Q4PRN, pt refused requested AMA form. At time of discharge last BP 197/109. Completed dialysis at bedside. At this time pt is noted to be dyspneic on exertion, leaving on RA. Required 2L NC while admitted. Pt leaving with all personal belongings, 2 PIVs removed. Permacath left intact. Reviewed consequences of leaving with patient, and need for current inpatient therapy. Refuses information. Medical team made aware of patients AMA discharge. Escorted via wheelchair to lobby for personal arrangements home.

## 2017-02-18 NOTE — Consults (Signed)
[facility]  CONSULT  [name] [address] [facility] Spectralink)      Date Time: 02/18/17 9:28 AM  Patient Name: [name]  Requesting Physician: Jerral Ralph, MD  Consulting Physician: Threasa Alpha, MD    Primary Care Physician: No primary care provider on file.    Reason for Consultation: CRF      Assessment:     Patient Active Problem List   Diagnosis   . Hematochezia   . Demand ischemia   . Symptomatic anemia   . ESRD on dialysis   . Hypertensive urgency   . Hypocalcemia   . Hyperkalemia   . High anion gap metabolic acidosis   . Lower abdominal pain   . Obesity (BMI 30.0-34.9)   . OSA on CPAP   . Prolonged Q-T interval on ECG       1. CRF  2. Severely fluid overloaded - carrying about 30 lbs  3. Hyperkalemia  4. Anemia from bleeding and CKD  5. Low calcium - likely very high phos  6. HTN - ACE-I allergy    Recommendations:     1. HD today and tomorrow. 1 K bath  2. Aggressive fluid removal  3. Start aranesp  4. Check phos  5. Check PTH  6. Change renvela to phos-lo  7. No need IV mag or addition IV calcium. Will put on high calcium dialysate bath today and tomorrow.        Jerral Ralph, MD, thank you for this consultation.  We will follow the patient with you during this hospitalization.  Please contact me with any questions or issues.    Signed by: Threasa Alpha, MD, FASN  [facility]  [phone] (office)  x [phone] [facility]ra-Link)      -----------------------------------------------------------------------------------------------------------        History of Presenting Illness:   [name] is a 46 y.o. male who presented to the hospital with abdominal pain, BRBPR, chset pain and SOB.  He has a hx of ESRD and has HD in [place].  He does not know who his doctor is or his outpt unit.  He says he has his HD in the hospital and has been on HD for 7 years. He has a left IJ permacath.    Past Medical History:     Past Medical History:   Diagnosis Date   . Asthma     . Clostridium difficile infection    . ESRD (end stage renal disease) on dialysis     HD since 2011   . Hypertension    . Myocardial infarction 2010    2 stents       Available old records reviewed, including:  none    Past Surgical History:   History reviewed. No pertinent surgical history.    Family History:     Family History   Problem Relation Age of Onset   . Hypertension Mother        Social History:     Social History     Social History   . Marital status: N/A     Spouse name: N/A   . Number of children: N/A   . Years of education: N/A     Social History Main Topics   . Smoking status: Current Every Day Smoker     Packs/day: 1.00     Years: 20.00   . Smokeless tobacco: Never Used   . Alcohol use No   . Drug use: Unknown   .  Sexual activity: Not on file     Other Topics Concern   . Not on file     Social History Narrative   . No narrative on file       Allergies:     Allergies   Allergen Reactions   . Asa [Aspirin] Angioedema     Facial, lip swelling and throat tightness   . Lisinopril    . Morphine      Tolerated dilaudid 11//02/18   . Toradol [Ketorolac Tromethamine]        Medications:     Prescriptions Prior to Admission   Medication Sig   . amLODIPine (NORVASC) 10 MG tablet Take 10 mg by mouth daily.   . carvedilol (COREG) 12.5 MG tablet Take 12.5 mg by mouth.   . furosemide (LASIX) 80 MG tablet Take 80 mg by mouth daily.   . hydrALAZINE (APRESOLINE) 100 MG tablet Take 100 mg by mouth 3 (three) times daily.   . sevelamer (RENAGEL) 800 MG tablet Take 800 mg by mouth 3 (three) times daily with meals.   . sodium chloride 0.9 % SOLN 40 mL with albuterol (5 MG/ML) 0.5% NEBU 2.5 mg/mL Inhale into the lungs as needed.        Scheduled Meds: PRN Meds:      amLODIPine 10 mg Oral Daily   calcium acetate 1,334 mg Oral TID MEALS   calcium GLUConate 1 g Intravenous Q1H   carvedilol 12.5 mg Oral Q12H SCH   darbepoetin alfa 60 mcg Subcutaneous Weekly   heparin (porcine) 5,000 Units Subcutaneous Q8H SCH   hydrALAZINE  100 mg Oral TID   vancomycin 125 mg Oral QID         Continuous Infusions:     sodium chloride  PRN   acetaminophen 650 mg Q4H PRN   Or     acetaminophen 650 mg Q4H PRN   albuterol 2.5 mg Q6H PRN   labetalol 20 mg Q6H PRN   naloxone 0.2 mg PRN           Review of Systems:   A comprehensive review of systems was per the HPI and below:     General ROS: no f/c, no weight changes  HEENT ROS: no blurry vision, no oral lesions, no epistaxis  Allergy/Immunology ROS: no new allergic reactions  Hematological and Lymphatic ROS: no known bleeding/clotting disorders  Respiratory ROS: + SOB  Cardiovascular ROS: negative for chest pain or dyspnea on exertion  Gastrointestinal ROS: + abdominal distention  Genito-Urinary ROS: negative for dysuria, hematuria, difficulty voiding or nocturia  Musculoskeletal ROS: negative for trauma or falls, arthralgias  Neurological ROS: no focal weakness, no dizziness  Endocrine ROS: no change in libido, no change in hair distribution  Dermatological ROS: no new rashes or lesions      Physical Exam:     Vitals:    02/18/17 0545 02/18/17 0600 02/18/17 0630 02/18/17 0824   BP: (!) 169/99 (!) 178/107 (!) 166/92 (!) 156/96   Pulse: 81 83 81 84   Resp:   20 20   Temp: 97.6 F (36.4 C)  97.6 F (36.4 C) 97.9 F (36.6 C)   TempSrc: Oral  Oral Oral   SpO2: 99% 98% 99% 98%   Weight:       Height:           Intake and Output Summary (Last 24 hours) at Date Time  No intake or output data in the 24 hours ending 02/18/17 4742  Recent weights:  Weight Monitoring 02/18/2017   Height 185.4 cm   Height Method Stated   Weight 106.414 kg   Weight Method Bed Scale   BMI (calculated) 31 kg/m2       General: awake, alert, oriented x 3, no acute distress.  HEENT: sclera anicteric, oropharynx clear without lesions, mucous membranes moist  Neck: supple, + LIJ permacath  Cardiovascular: regular rate and rhythm, no murmurs, rubs or gallops  Lungs: clear to auscultation bilaterally, without wheezing, rhonchi, or  rales  Abdomen: soft, non-tender, 3 + tense abdominal wall edema  Extremities: 3 + very tense edema  Neuro: A+O x 3, no gross motor/sensory deficits  Skin: no rashes or lesions noted        Labs:     Recent Labs  Lab 02/18/17  0237   WBC 7.19   Hgb 7.1*   Hematocrit 23.0*   Platelets 143       Recent Labs  Lab 02/18/17  0237   Sodium 136   Potassium 6.2*   Chloride 104   CO2 15*   BUN 81.0*   Creatinine 13.7*   Calcium 6.2*   Albumin 3.0*   Magnesium 1.6   Glucose 80   EGFR 3.9             Invalid input(s): LEUKOCYTESUR        Imaging personally reviewed, including: CXR      cc: Jerral Ralph, MD  No primary care provider on file.

## 2017-02-18 NOTE — Discharge Instr - AVS First Page (Signed)
You were admitted for concern for bloody diarrhea due to possible clostridium difficile infection, chest pain, low blood count, volume overload, and very high blood pressures. You received one round of dialysis and were seen by kidney, heart, and stomach/intestinal specialists. You were recommended to get another session of hemodialysis tomorrow. To take vancomycin (antibiotics) 10 days total, and to get a heart study on Monday; along with a fecal transplant for your gut infection. You were recommended to stay in the hospital until your markers that show injury to the heart decreased, until your volume status improved with hemodialysis, until your bloody diarrhea and abdominal pain improved with adequate treatment with antibiotics and/or fecal transplant, as well as until your blood pressure normalized with adequate blood pressure medications in the hospital.

## 2017-02-23 NOTE — Other (Signed)
UPDATED Patient Management Plan      Pt Name: David [name] DOB:10/27/1970      Management Plan by: High ED Utilizers Team                                                                                           Date Placed:  02/23/17   Pt's Physicians:         Primary Care:  None         Specialists: Unknown                    Summary    Reason for Referral: This patient has been provided a management plan for Chronic Pain, Radiation Limitation and Psychiatric Needs     Patient with frequent visits for: CP, Abdominal pain (recurrent C.Diff), dialysis s/p non-compliance (Dialysis center in [name] no longer accepts patient).    Warning/Safety Alert:     Frequently leaves AMA when not given IV Narcotics and is easily agitated. Strongly consider having security/PD nearby    Pt has visited 49 different facilities x 1 year   PMP:  Prescriptions: 4      Prescribers:  4     Pharmacies: 3   (medication preference is IV Dilaudid)   Pt scored a 38 on the VOA (Venebio Opioid Advisor).  This score indicates a 55.1% probability of OD or Resp. Dep. within the next 6 months     Situation: Chronic Conditions Summary ???     Root Cause Medical Problems List:  ESRD w/ dialysis, Cirrhosis, C. Diff (recurrent), MI, HTN, Non-compliance to tx (ie dialysis, completing meds for C. Diff)  Root Cause Psychosocial Problems List: SUD  Root Cause Social Determinants of Health (SDOH): On disability, has own transportation, moved from [name] to live with sister  Incidence of Testing: Over past 12 months: 43 XR, 35 CT, 0 MRI    Pattern of Access:  Feb 18, 2016- Feb 17, 2017: +138 visits and admissions between 49 different facilities       Sporadic times ??? Early am generally, several evening times.     Goals/Interventions:     ? ED Provider/nursing(together) to discuss mgmt. plan w/ pt (document)  ? ED Provider to review PMP with pt  ? Please review prior imaging; he is at high risk of overexposure   ? Nursing/CM obtain updated list of current providers and attempt to obtain ROI for coordination of care  ? Consider non-narcotic medications/alternatives to benzodiazepines unless emergently necessary.   ? Educate patient on the appropriate use of ED. Provide UC and [facility] information  ? Pt needs access to and education on use of Narcan. Encourage pt to identify someone who can administer Narcan in the event pt becomes unresponsive.  ? Provide pt with High ED utilizer letter and [facility] Safe Opioid Prescribing Guidelines   ? Offer pt resource: [facility]  [phone]) 8 am - midnight 7 days/week  ? Consider Palliative consult  ? CM/Nursing/Provider strongly encourage to Contact Medicare for verification of insurance??    During Business Hours: CM   After  Hours: BSMART(as warranted)   Challenges for Self Management:     ? Impairment of cognition and/or functional limitations: unknown  ? Financial Constraints: Unknown  ? Utilization: Extremely high ED visits and admissions!!!  ? Emotional status of Patient: Unknown  ? Behavioral Health Factors:  Easily agitated when not given IV narcotics  ? Motivational level: Likely low  Transportation:  Drives self or uses EMS    Medication Management: Complications from poor adherence, Poor Adherence    Advanced Care Plan: Advanced directive on file                ** This plan has been created by the Care Coordination Committee, a multi-disciplinary team. The patient and their physician were invited to participate in this plan. This management plan is intended to provide consistent evaluation and treatment for this patient not to supersede physician judgment.**

## 2017-02-28 ENCOUNTER — Inpatient Hospital Stay
Admission: EM | Admit: 2017-02-28 | Discharge: 2017-02-28 | DRG: 304 | Discharge status: Against Medical Advice | Payer: Self-pay | Attending: Internal Medicine | Admitting: Internal Medicine

## 2017-02-28 ENCOUNTER — Inpatient Hospital Stay: Payer: Self-pay

## 2017-02-28 DIAGNOSIS — E8770 Fluid overload, unspecified: Secondary | ICD-10-CM | POA: Diagnosis present

## 2017-02-28 DIAGNOSIS — R079 Chest pain, unspecified: Secondary | ICD-10-CM

## 2017-02-28 DIAGNOSIS — E669 Obesity, unspecified: Secondary | ICD-10-CM | POA: Diagnosis present

## 2017-02-28 DIAGNOSIS — D631 Anemia in chronic kidney disease: Secondary | ICD-10-CM | POA: Diagnosis present

## 2017-02-28 DIAGNOSIS — Z955 Presence of coronary angioplasty implant and graft: Secondary | ICD-10-CM

## 2017-02-28 DIAGNOSIS — R1030 Lower abdominal pain, unspecified: Secondary | ICD-10-CM | POA: Diagnosis present

## 2017-02-28 DIAGNOSIS — J45909 Unspecified asthma, uncomplicated: Secondary | ICD-10-CM | POA: Diagnosis present

## 2017-02-28 DIAGNOSIS — Z9989 Dependence on other enabling machines and devices: Secondary | ICD-10-CM

## 2017-02-28 DIAGNOSIS — I160 Hypertensive urgency: Principal | ICD-10-CM | POA: Diagnosis present

## 2017-02-28 DIAGNOSIS — I1311 Hypertensive heart and chronic kidney disease without heart failure, with stage 5 chronic kidney disease, or end stage renal disease: Secondary | ICD-10-CM | POA: Diagnosis present

## 2017-02-28 DIAGNOSIS — N2581 Secondary hyperparathyroidism of renal origin: Secondary | ICD-10-CM | POA: Diagnosis present

## 2017-02-28 DIAGNOSIS — F17210 Nicotine dependence, cigarettes, uncomplicated: Secondary | ICD-10-CM | POA: Diagnosis present

## 2017-02-28 DIAGNOSIS — R197 Diarrhea, unspecified: Secondary | ICD-10-CM | POA: Diagnosis present

## 2017-02-28 DIAGNOSIS — N186 End stage renal disease: Secondary | ICD-10-CM | POA: Diagnosis present

## 2017-02-28 DIAGNOSIS — Z992 Dependence on renal dialysis: Secondary | ICD-10-CM

## 2017-02-28 DIAGNOSIS — G4733 Obstructive sleep apnea (adult) (pediatric): Secondary | ICD-10-CM | POA: Diagnosis present

## 2017-02-28 DIAGNOSIS — D649 Anemia, unspecified: Secondary | ICD-10-CM | POA: Diagnosis present

## 2017-02-28 DIAGNOSIS — Z9119 Patient's noncompliance with other medical treatment and regimen: Secondary | ICD-10-CM

## 2017-02-28 DIAGNOSIS — E872 Acidosis: Secondary | ICD-10-CM | POA: Diagnosis present

## 2017-02-28 LAB — CBC AND DIFFERENTIAL
Absolute NRBC: 0 10*3/uL
Basophils Absolute Automated: 0.03 10*3/uL (ref 0.00–0.20)
Basophils Automated: 0.4 %
Eosinophils Absolute Automated: 0.41 10*3/uL (ref 0.00–0.70)
Eosinophils Automated: 6.1 %
Hematocrit: 25.7 % — ABNORMAL LOW (ref 42.0–52.0)
Hgb: 7.9 g/dL — ABNORMAL LOW (ref 13.0–17.0)
Immature Granulocytes Absolute: 0.03 10*3/uL
Immature Granulocytes: 0.4 %
Lymphocytes Absolute Automated: 0.47 10*3/uL — ABNORMAL LOW (ref 0.50–4.40)
Lymphocytes Automated: 7 %
MCH: 29.8 pg (ref 28.0–32.0)
MCHC: 30.7 g/dL — ABNORMAL LOW (ref 32.0–36.0)
MCV: 97 fL (ref 80.0–100.0)
MPV: 9.7 fL (ref 9.4–12.3)
Monocytes Absolute Automated: 0.47 10*3/uL (ref 0.00–1.20)
Monocytes: 7 %
Neutrophils Absolute: 5.35 10*3/uL (ref 1.80–8.10)
Neutrophils: 79.1 %
Nucleated RBC: 0 /100 WBC (ref 0.0–1.0)
Platelets: 167 10*3/uL (ref 140–400)
RBC: 2.65 10*6/uL — ABNORMAL LOW (ref 4.70–6.00)
RDW: 17 % — ABNORMAL HIGH (ref 12–15)
WBC: 6.76 10*3/uL (ref 3.50–10.80)

## 2017-02-28 LAB — COMPREHENSIVE METABOLIC PANEL
ALT: 12 U/L (ref 0–55)
AST (SGOT): 21 U/L (ref 5–34)
Albumin/Globulin Ratio: 0.9 (ref 0.9–2.2)
Albumin: 2.7 g/dL — ABNORMAL LOW (ref 3.5–5.0)
Alkaline Phosphatase: 227 U/L — ABNORMAL HIGH (ref 38–106)
Anion Gap: 15 (ref 5.0–15.0)
BUN: 49 mg/dL — ABNORMAL HIGH (ref 9–28)
Bilirubin, Total: 0.6 mg/dL (ref 0.2–1.2)
CO2: 17 mEq/L — ABNORMAL LOW (ref 22–29)
Calcium: 5.8 mg/dL — CL (ref 8.5–10.5)
Chloride: 106 mEq/L (ref 100–111)
Creatinine: 11.9 mg/dL — ABNORMAL HIGH (ref 0.7–1.3)
Globulin: 3.1 g/dL (ref 2.0–3.6)
Glucose: 85 mg/dL (ref 70–100)
Potassium: 5 mEq/L (ref 3.5–5.1)
Protein, Total: 5.8 g/dL — ABNORMAL LOW (ref 6.0–8.3)
Sodium: 138 mEq/L (ref 136–145)

## 2017-02-28 LAB — GFR: EGFR: 4.6

## 2017-02-28 LAB — TROPONIN I: Troponin I: 0.1 ng/mL — ABNORMAL HIGH (ref 0.00–0.09)

## 2017-02-28 LAB — PT/INR
PT INR: 1.1 (ref 0.9–1.1)
PT: 14.4 s (ref 12.6–15.0)

## 2017-02-28 LAB — STOOL OCCULT BLOOD: Stool Occult Blood: NEGATIVE

## 2017-02-28 LAB — LIPASE: Lipase: 91 U/L — ABNORMAL HIGH (ref 8–78)

## 2017-02-28 LAB — B-TYPE NATRIURETIC PEPTIDE: B-Natriuretic Peptide: 2700 pg/mL — ABNORMAL HIGH (ref 0–100)

## 2017-02-28 LAB — PHOSPHORUS: Phosphorus: 4.3 mg/dL (ref 2.3–4.7)

## 2017-02-28 LAB — MAGNESIUM: Magnesium: 1.7 mg/dL (ref 1.6–2.6)

## 2017-02-28 MED ORDER — AMLODIPINE BESYLATE 5 MG PO TABS
10.0000 mg | ORAL_TABLET | Freq: Every day | ORAL | Status: DC
Start: 2017-02-28 — End: 2017-02-28
  Filled 2017-02-28: qty 2

## 2017-02-28 MED ORDER — POLYETHYLENE GLYCOL 3350 17 G PO PACK
17.0000 g | PACK | Freq: Every day | ORAL | Status: DC | PRN
Start: 2017-02-28 — End: 2017-02-28

## 2017-02-28 MED ORDER — ACETAMINOPHEN 325 MG PO TABS
650.0000 mg | ORAL_TABLET | ORAL | Status: DC | PRN
Start: 2017-02-28 — End: 2017-02-28

## 2017-02-28 MED ORDER — HYDROMORPHONE HCL 1 MG/ML IJ SOLN
0.2000 mg | Freq: Once | INTRAMUSCULAR | Status: AC
Start: 2017-02-28 — End: 2017-02-28
  Administered 2017-02-28: 07:00:00 0.2 mg via INTRAVENOUS

## 2017-02-28 MED ORDER — HYDROMORPHONE HCL 1 MG/ML IJ SOLN
INTRAMUSCULAR | Status: DC
Start: 2017-02-28 — End: 2017-02-28
  Filled 2017-02-28: qty 0.5

## 2017-02-28 MED ORDER — HYDRALAZINE HCL 25 MG PO TABS
100.0000 mg | ORAL_TABLET | Freq: Three times a day (TID) | ORAL | Status: DC
Start: 2017-02-28 — End: 2017-02-28
  Filled 2017-02-28: qty 4

## 2017-02-28 MED ORDER — HYDROMORPHONE HCL 1 MG/ML IJ SOLN
0.2000 mg | Freq: Once | INTRAMUSCULAR | Status: AC
Start: 2017-02-28 — End: 2017-02-28
  Administered 2017-02-28: 11:00:00 0.2 mg via INTRAVENOUS
  Filled 2017-02-28: qty 0.5

## 2017-02-28 MED ORDER — NALOXONE HCL 0.4 MG/ML IJ SOLN (WRAP)
0.2000 mg | INTRAMUSCULAR | Status: DC | PRN
Start: 2017-02-28 — End: 2017-02-28

## 2017-02-28 MED ORDER — CARVEDILOL 6.25 MG PO TABS
12.5000 mg | ORAL_TABLET | Freq: Two times a day (BID) | ORAL | Status: DC
Start: 2017-02-28 — End: 2017-02-28
  Filled 2017-02-28: qty 2

## 2017-02-28 MED ORDER — ONDANSETRON HCL 4 MG/2ML IJ SOLN
4.0000 mg | Freq: Four times a day (QID) | INTRAMUSCULAR | Status: DC | PRN
Start: 2017-02-28 — End: 2017-02-28

## 2017-02-28 MED ORDER — CALCIUM GLUCONATE 10 % IV SOLN
1.0000 g | Freq: Once | INTRAVENOUS | Status: DC
Start: 2017-02-28 — End: 2017-02-28

## 2017-02-28 MED ORDER — FUROSEMIDE 40 MG PO TABS
80.0000 mg | ORAL_TABLET | Freq: Every day | ORAL | Status: DC
Start: 2017-02-28 — End: 2017-02-28
  Filled 2017-02-28: qty 2

## 2017-02-28 MED ORDER — HYDRALAZINE HCL 20 MG/ML IJ SOLN
10.0000 mg | Freq: Once | INTRAMUSCULAR | Status: AC
Start: 2017-02-28 — End: 2017-02-28
  Administered 2017-02-28: 07:00:00 10 mg via INTRAVENOUS
  Filled 2017-02-28: qty 1

## 2017-02-28 MED ORDER — CALCIUM ACETATE 667 MG PO CAPS
1334.0000 mg | ORAL_CAPSULE | Freq: Three times a day (TID) | ORAL | Status: DC
Start: 2017-02-28 — End: 2017-02-28

## 2017-02-28 MED ORDER — ONDANSETRON 4 MG PO TBDP
4.0000 mg | ORAL_TABLET | Freq: Four times a day (QID) | ORAL | Status: DC | PRN
Start: 2017-02-28 — End: 2017-02-28

## 2017-02-28 MED ORDER — ONDANSETRON HCL 4 MG/2ML IJ SOLN
4.0000 mg | Freq: Once | INTRAMUSCULAR | Status: AC
Start: 2017-02-28 — End: 2017-02-28
  Administered 2017-02-28: 08:00:00 4 mg via INTRAVENOUS
  Filled 2017-02-28: qty 2

## 2017-02-28 MED ORDER — FIRST-VANCOMYCIN 50 50 MG/ML PO SOLN
125.0000 mg | Freq: Four times a day (QID) | ORAL | Status: DC
Start: 2017-02-28 — End: 2017-02-28

## 2017-02-28 MED ORDER — VANCOMYCIN ORAL SOLUTION 50 MG/ML UNIT DOSE
125.0000 mg | Freq: Four times a day (QID) | ORAL | Status: DC
Start: 2017-02-28 — End: 2017-02-28
  Filled 2017-02-28 (×3): qty 5

## 2017-02-28 MED ORDER — SODIUM CHLORIDE 0.9 % IV SOLN
1.0000 g | Freq: Once | INTRAVENOUS | Status: AC
Start: 2017-02-28 — End: 2017-02-28
  Administered 2017-02-28: 08:00:00 1 g via INTRAVENOUS
  Filled 2017-02-28: qty 10

## 2017-02-28 MED ORDER — ACETAMINOPHEN 650 MG RE SUPP
650.0000 mg | RECTAL | Status: DC | PRN
Start: 2017-02-28 — End: 2017-02-28

## 2017-02-28 NOTE — H&P (Signed)
ADMISSION HISTORY AND PHYSICAL EXAM    [name], DIVISION OF HOSPITALIST MEDICINE   [name]   [place] Pager: [phone]      Date Time: 02/28/17 9:34 AM  Patient Name: [name]  Attending Physician: Arna Medici, MD  Primary Care Physician: Christa See, MD    CC: abd pain, N/V, bloody diarrhea     Assessment:     Active [name] Problems    Diagnosis   . Hypertensive urgency   . Symptomatic anemia   . OSA on CPAP   . Obesity (BMI 30.0-34.9)   . Lower abdominal pain   . Hypocalcemia   . ESRD on dialysis   Blood in stool and diarrhea r/o C diff   Non compliance     Plan:   -Admit to [facility] Hospitalist service  46 year old male with known history of coronary artery disease, hypertension, end-stage renal disease on dialysis and also prior history of C. difficile infection, presented for evaluation of abdominal pain, nausea /vomiting and diarrhea.    -Patient was noted to have hypertensive urgency admission, will resume all home medication as he indicated that he has not been able to take them for last 4 days due to nausea.  At the same time he also strongly requested to have regular diet and does not feel that he needs any gradual diet advancement or clear liquids etc.    -End-stage renal disease on dialysis since 2011  Patient indicates that he has been discharged from 2 major dialysis companies since July 2018 and has not had any scheduled dialysis due to those reasons.  He normally lives in [place] area and was traveling in this area.  He is already scheduled to receive urgent dialysis today, nephrology consulted and appreciate the recommendations  Discussed with patient about importance of compliance with dialysis, potential limitations on his health.  Inconsistent schedule and renal diet.  Patient was very much against renal diet.  Consult case management for assistance with outpatient dialysis arrangements    -History of C. difficile and current nausea vomiting and  diarrhea.  Patient reports 8-10 loose stool a day, per nursing staff he has had one formed stool so far, stool for occult blood was negative and that testing but patient does report having blood with most stool.  Continue to observe him clinically, started on empiric vancomycin and obtain stool for C. difficile test, monitor serial hemoglobin.  He completed  2 week course of vancomycin, earlier in November and has not been taking any since his last admission      -Anemia of chronic kidney disease, stable, patient does report having need for frequent blood transfusion.  Hemoglobin is adequate, monitor serially    -Chest discomfort, repeat serial cardiac markers, in the recent admission at [name] where patient left AGAINST MEDICAL ADVICE, he was noted to have mild troponin elevation which was felt to be demand ischemia in the setting of untreated hypertension.  Patient was at that time recommended to have stress test.  Occasional cardiac markers and further workup as indicated    -Reported OSA- supportive care, CPAP qhs     - Noncompliance patient extensively educated  -Reported history of ascites, obtain further medical records from his prior facilities, patient does not appear currently symptomatic with ascites, consider further imaging and workup after records reviewed      Disposition:     Anticipated medical stability for discharge: TBD  Service status: Inpatient: risk of morbidity and mortality and risk of  progressive disease    History of Presenting Illness:   [name] is a 46 y.o. male presenting with Chest pain, abdominal pain nausea and vomiting.  Patient describes having lower abdominal pain which goes from left to right and his LOWER abdomen, described as severe, 10/10.  At that time patient was sitting and eating his meal and appeared quite comfortable and answering questions appropriately.  He also described lower sternal chest discomfort.  His symptoms have been ongoing since last Friday which  was about 4 days ago.  He indicates that after he left [name] on November 2 AGAINST MEDICAL ADVICE with similar complaints, he had some improvement but later on his symptoms again return.  He describes having 7-8 loose stool a day, there is blood which is darkish appearing [name] appearing in most stool.  He denies any fever today but may have had fever yesterday.  Also reports having several episodes of nausea and vomiting and unable to take medications for the last 3 days.  He normally indicates compliance with medication treatment but has not been compliant with dialysis and Reason is, he was "Discharged" from Several Dialysis Facilities.  Currently he does not have any outpatient Dialysis arrangement as of July 2018 and goes to Emergency Room for every Dialysis     He presented with similar issues in [place] where he was evaluated on November 2, but left AGAINST MEDICAL ADVICE after not getting pain medications to his satisfaction. He is anuric.  Also indicates he gets paracentesis weekly, and doesn't know why? Last reported was last week.     Past Medical History:     Past Medical History:   Diagnosis Date   . Asthma    . Clostridium difficile infection    . ESRD (end stage renal disease) on dialysis     HD since 2011   . Hypertension    . Myocardial infarction 2010    2 stents       Available old records reviewed, including: EPIC     Past Surgical History:   History reviewed. No pertinent surgical history.    Family History:     Family History   Problem Relation Age of Onset   . Hypertension Mother        Social History:     History   Smoking Status   . Current Every Day Smoker   . Packs/day: 1.00   . Years: 20.00   Smokeless Tobacco   . Never Used     History   Alcohol Use No     History   Drug use: Unknown       Allergies:     Allergies   Allergen Reactions   . Asa [Aspirin] Angioedema     Facial, lip swelling and throat tightness   . Lisinopril    . Morphine      Tolerated dilaudid 11//02/18   .  Toradol [Ketorolac Tromethamine]        Medications:     Home Medications     Med List Status:  In Progress Set By: Elberta Fortis, RN at 02/28/2017  5:38 AM                amLODIPine (NORVASC) 10 MG tablet     Take 10 mg by mouth daily.     calcium acetate (PHOSLO) 667 MG capsule     Take 2 capsules (1,334 mg total) by mouth 3 (three) times daily with meals.  carvedilol (COREG) 12.5 MG tablet     Take 12.5 mg by mouth.     darbepoetin alfa (ARANESP) 60 MCG/0.3ML injection     Inject 0.3 mLs (60 mcg total) into the skin once a week.     furosemide (LASIX) 80 MG tablet     Take 80 mg by mouth daily.     hydrALAZINE (APRESOLINE) 100 MG tablet     Take 100 mg by mouth 3 (three) times daily.     sodium chloride 0.9 % SOLN 40 mL with albuterol (5 MG/ML) 0.5% NEBU 2.5 mg/mL     Inhale into the lungs as needed.     vancomycin (VANCOCIN HCL) 125 MG capsule     Take 1 capsule (125 mg total) by mouth 4 (four) times daily.for 10 days          Method by which medications were confirmed on admission: Pt info      Review of Systems:   All other systems were reviewed and are negative except:as above in HPI     Physical Exam:   Patient Vitals for the past 24 hrs:   BP Temp Temp src Pulse Resp SpO2 Height Weight   02/28/17 0903 (!) 183/105 97.8 F (36.6 C) Oral - 20 100 % 1.854 m (6\' 1" ) 102.6 kg (226 lb 3.1 oz)   02/28/17 0730 (!) 166/91 97.9 F (36.6 C) Tympanic 90 20 - - -   02/28/17 0600 (!) 174/108 - - 97 22 100 % - -   02/28/17 0539 (!) 182/103 99 F (37.2 C) Oral 94 - 99 % - -   02/28/17 0534 - - - - 16 - - 104.6 kg (230 lb 9.6 oz)     Body mass index is 29.84 kg/m.  No intake or output data in the 24 hours ending 02/28/17 0934    General: awake; WD male  no acute distress.  HEENT: perrla, eomi, sclera anicteric  oropharynx clear without lesions, mucous membranes moist  Neck: supple, no lymphadenopathy, no JVD, no carotid bruits  Cardiovascular: Normal S1 and S2, no murmurs, rubs or gallops  Lungs: clear to  auscultation bilaterally, without wheezing, rhonchi, or rales  Abdomen: soft, non-tender, distended; no palpable masses, no hepatosplenomegaly, normoactive bowel sounds, no rebound or guarding  Extremities: +1 B/L  edema  Neuro: alert, oriented x 3, cranial nerves grossly intact, strength 5/5 in upper and lower extremities, sensation intact,   Skin: no rashes or lesions noted    Labs:     Results     Procedure Component Value Units Date/Time    MRSA culture [[id number]] Collected:  02/28/17 0926    Specimen:  Body Fluid from Nares and Throat Updated:  02/28/17 0926    Magnesium [[id number]] Collected:  02/28/17 0555    Specimen:  Blood Updated:  02/28/17 0735     Magnesium 1.7 mg/dL     Phosphorus [[id number]] Collected:  02/28/17 0555    Specimen:  Blood Updated:  02/28/17 0735     Phosphorus 4.3 mg/dL     Stool Occult Blood [[id number]] Collected:  02/28/17 0723    Specimen:  Stool Updated:  02/28/17 0732     Stool Occult Blood Negative    B-type Natriuretic Peptide (BNP) [[id number]]  (Abnormal) Collected:  02/28/17 0555    Specimen:  Blood Updated:  02/28/17 0729     B-Natriuretic Peptide 2,700 (H) pg/mL     Lipase [[id number]]  (Abnormal) Collected:  02/28/17 0556    Specimen:  Blood Updated:  02/28/17 0629     Lipase 91 (H) U/L     GFR [[id number]] Collected:  02/28/17 0556     Updated:  02/28/17 0629     EGFR 4.6    Comprehensive metabolic panel [[id number]]  (Abnormal) Collected:  02/28/17 0556    Specimen:  Blood Updated:  02/28/17 0628     Glucose 85 mg/dL      BUN 49 (H) mg/dL      Creatinine 65.7 (H) mg/dL      Sodium 846 mEq/L      Potassium 5.0 mEq/L      Chloride 106 mEq/L      CO2 17 (L) mEq/L      Calcium 5.8 (LL) mg/dL      Protein, Total 5.8 (L) g/dL      Albumin 2.7 (L) g/dL      AST (SGOT) 21 U/L      ALT 12 U/L      Alkaline Phosphatase 227 (H) U/L      Bilirubin, Total 0.6 mg/dL      Globulin 3.1 g/dL      Albumin/Globulin Ratio 0.9     Anion Gap 15.0    Troponin I [[id number]]  (Abnormal) Collected:   02/28/17 0556    Specimen:  Blood Updated:  02/28/17 0624     Troponin I 0.10 (H) ng/mL     Prothrombin time/INR [[id number]] Collected:  02/28/17 0556    Specimen:  Blood Updated:  02/28/17 0614     PT 14.4 sec      PT INR 1.1     PT Anticoag. Given Within 48 hrs. None    CBC with differential [[id number]]  (Abnormal) Collected:  02/28/17 0556    Specimen:  Blood from Blood Updated:  02/28/17 0606     WBC 6.76 x10 3/uL      Hgb 7.9 (L) g/dL      Hematocrit 47.4 (L) %      Platelets 167 x10 3/uL      RBC 2.65 (L) x10 6/uL      MCV 97.0 fL      MCH 29.8 pg      MCHC 30.7 (L) g/dL      RDW 17 (H) %      MPV 9.7 fL      Neutrophils 79.1 %      Lymphocytes Automated 7.0 %      Monocytes 7.0 %      Eosinophils Automated 6.1 %      Basophils Automated 0.4 %      Immature Granulocyte 0.4 %      Nucleated RBC 0.0 /100 WBC      Neutrophils Absolute 5.35 x10 3/uL      Abs Lymph Automated 0.47 (L) x10 3/uL      Abs Mono Automated 0.47 x10 3/uL      Abs Eos Automated 0.41 x10 3/uL      Absolute Baso Automated 0.03 x10 3/uL      Absolute Immature Granulocyte 0.03 x10 3/uL      Absolute NRBC 0.00 x10 3/uL         EKG noted  Imaging personally reviewed, including: all available   Chest Ap Portable    Result Date: 02/28/2017  FINDINGS/ No significant change from most recent examination. Stable mild cardiomegaly. No acute focal consolidation, large pleural effusion or pneumothorax. There may be mild venous pulmonary congestion. Stable left central venous catheter with tip within the right  atrium. Stable likely right subclavian vascular stent. Jorge Fickle, MD 02/28/2017 8:06 AM      Safety Checklist  DVT prophylaxis:  CHEST guideline (See page e199S) Mechanical     This note was generated by the Epic EMR system/ Dragon speech recognition and may contain inherent errors or omissions not intended by the user. Grammatical errors, random word insertions, deletions and pronoun errors  are occasional consequences of this technology  due to software limitations. Not all errors are caught or corrected. If there are questions or concerns about the content of this note or information contained within the body of this dictation they should be addressed directly with the author for clarification.    Signed by: Arna Medici, MD   cc:Pcp, Octaviano Glow, MD

## 2017-02-28 NOTE — Progress Notes (Signed)
Patient stating "I want to leave, I need to go home." Explained the plan for dialysis at 2:00pm to patient. Patient persists that needs to go home to "take care of something with my son". Risks of not getting dialysis explained to patient, patient continues to refuse plan stating "I want to leave AMA." MD Hamid called at this time, placed on speaker phone to discuss risks of leaving AMA with patient. Patient acknowledging risks, denies questions to MD. Second RN witness at bedside for phone call. Patient signed paperwork after call, offered PO blood pressure meds that were held earlier this am due to plan for dialysis. Patient refusing to take them, refusing vital signs. IV catheter removed, tip intact, gauze and pressure applied with dressing. Belongings confirmed on departure, ambulatory with steady gait, refusing assistance off unit.

## 2017-02-28 NOTE — Consults (Signed)
CONSULTATION    Date Time: 02/28/17 11:47 AM  Patient Name: [name]  Requesting Physician: Arna Medici, MD      Reason for Consultation:     ESRD   Non compliance       Assessment:     End-stage renal disease.  On the hemodialysis for approximately 7 years.  Has been very noncompliant and does not have any outpatient dialysis as he has been fired from 2 different dialysis units  HTN with CKD. BP high.   Sec Hyperparathyroidism  Metabolic acidosis  Non compliant behavior.   Diarrhea. Intermittent   Volume overload with pulmonary congestion     Plan:     HD today - Later I came to know that he wants to leave AGAINST MEDICAL ADVICE  Had a detailed discussion about non compliance behavior   Continue home blood pressure medications.  Renal diet  Had a detailed discussion about behavioral contract and getting into a HD unit.   Discussed with Dr Delsa Grana in detail       History:     He has past medical histor of HTN, CKD on HD - non compliant and has been fired by his dialysis units near his home - however, he has been going to different dialysis units in the area and gets hemodialysis as needed.  Had a detailed discussion with him and told him about the risk associated with his noncompliant behavior.    Past Medical History:     Past Medical History:   Diagnosis Date   . Asthma    . Clostridium difficile infection    . ESRD (end stage renal disease) on dialysis     HD since 2011   . Hypertension    . Myocardial infarction 2010    2 stents       Past Surgical History:     Past Surgical History:   Procedure Laterality Date   . HERNIA REPAIR  2016       Family History:     Family History   Problem Relation Age of Onset   . Hypertension Mother        Social History:     Social History     Social History   . Marital status: Single     Spouse name: N/A   . Number of children: N/A   . Years of education: N/A     Social History Main Topics   . Smoking status: Current Every Day Smoker     Packs/day: 0.25     Years: 20.00   .  Smokeless tobacco: Never Used   . Alcohol use No   . Drug use: No   . Sexual activity: Not on file     Other Topics Concern   . Not on file     Social History Narrative   . No narrative on file       Allergies:     Allergies   Allergen Reactions   . Asa [Aspirin] Angioedema     Facial, lip swelling and throat tightness   . Lisinopril Angioedema   . Morphine Angioedema     Tolerated dilaudid 11//02/18   . Toradol [Ketorolac Tromethamine] Angioedema       Medications:     Current Facility-Administered Medications   Medication Dose Route Frequency   . amLODIPine  10 mg Oral Daily   . calcium acetate  1,334 mg Oral TID MEALS   . carvedilol  12.5 mg Oral Q12H SCH   .  furosemide  80 mg Oral Daily   . hydrALAZINE  100 mg Oral TID   . vancomycin  125 mg Oral 4 times per day     Intravenous Medications:     Prn Meds:  acetaminophen **OR** acetaminophen, naloxone, ondansetron **OR** ondansetron     Review of Systems:     Mild SOB   No history of fever, rigor or chills   No history of NSAIDs.   Has no UTI symptoms   No rashes or bruises   Has no cough, sputum  No bloody or frothy urine.   No history of excessive thirst or sweating.  Has some  diarrhea.   No chest pain or palpitation  No abd pain, nausea or vomiting  No swelling  No joint symptoms  No headache, visual changes, focal neurological symptoms  No psych problems  All other systems reviewed and negative for new problems  Physical Exam:     Vitals:    02/28/17 0903   BP: (!) 183/105   Pulse:    Resp: 20   Temp: 97.8 F (36.6 C)   SpO2: 100%       Intake and Output Summary (Last 24 hours) at Date Time    Intake/Output Summary (Last 24 hours) at 02/28/17 1147  Last data filed at 02/28/17 1100   Gross per 24 hour   Intake              340 ml   Output                0 ml   Net              340 ml         General appearance - alert, and appears to be chronically sick   Mental status - alert, oriented to person, place, and time  HEENT unremarkable  Mouth - mucous membranes  moist, pharynx normal without lesions  Neck - supple, no LN, thyroid  Chest - clear to auscultation, no wheezes, rales or rhonchi.  Heart - normal rate, regular rhythm, normal S1, S2, no murmurs, rubs.  Abdomen - soft and distended abdomen   bowel sounds normal  No abdominal bruits  No leg edema  No skin rashes, or bruises.   Distal pulses palpable.   Permacath in place  Clotted AVF         Labs Reviewed:     Results     Procedure Component Value Units Date/Time    MRSA culture [[id number]] Collected:  02/28/17 0926    Specimen:  Body Fluid from Nares and Throat Updated:  02/28/17 0926    Magnesium [[id number]] Collected:  02/28/17 0555    Specimen:  Blood Updated:  02/28/17 0735     Magnesium 1.7 mg/dL     Phosphorus [[id number]] Collected:  02/28/17 0555    Specimen:  Blood Updated:  02/28/17 0735     Phosphorus 4.3 mg/dL     Stool Occult Blood [[id number]] Collected:  02/28/17 0723    Specimen:  Stool Updated:  02/28/17 0732     Stool Occult Blood Negative    B-type Natriuretic Peptide (BNP) [[id number]]  (Abnormal) Collected:  02/28/17 0555    Specimen:  Blood Updated:  02/28/17 0729     B-Natriuretic Peptide 2,700 (H) pg/mL     Lipase [[id number]]  (Abnormal) Collected:  02/28/17 0556    Specimen:  Blood Updated:  02/28/17 0629     Lipase 91 (H) U/L  GFR [[id number]] Collected:  02/28/17 0556     Updated:  02/28/17 0629     EGFR 4.6    Comprehensive metabolic panel [[id number]]  (Abnormal) Collected:  02/28/17 0556    Specimen:  Blood Updated:  02/28/17 0628     Glucose 85 mg/dL      BUN 49 (H) mg/dL      Creatinine 78.2 (H) mg/dL      Sodium 956 mEq/L      Potassium 5.0 mEq/L      Chloride 106 mEq/L      CO2 17 (L) mEq/L      Calcium 5.8 (LL) mg/dL      Protein, Total 5.8 (L) g/dL      Albumin 2.7 (L) g/dL      AST (SGOT) 21 U/L      ALT 12 U/L      Alkaline Phosphatase 227 (H) U/L      Bilirubin, Total 0.6 mg/dL      Globulin 3.1 g/dL      Albumin/Globulin Ratio 0.9     Anion Gap 15.0    Troponin I [[id number]]   (Abnormal) Collected:  02/28/17 0556    Specimen:  Blood Updated:  02/28/17 0624     Troponin I 0.10 (H) ng/mL     Prothrombin time/INR [[id number]] Collected:  02/28/17 0556    Specimen:  Blood Updated:  02/28/17 0614     PT 14.4 sec      PT INR 1.1     PT Anticoag. Given Within 48 hrs. None    CBC with differential [[id number]]  (Abnormal) Collected:  02/28/17 0556    Specimen:  Blood from Blood Updated:  02/28/17 0606     WBC 6.76 x10 3/uL      Hgb 7.9 (L) g/dL      Hematocrit 27.2 (L) %      Platelets 167 x10 3/uL      RBC 2.65 (L) x10 6/uL      MCV 97.0 fL      MCH 29.8 pg      MCHC 30.7 (L) g/dL      RDW 17 (H) %      MPV 9.7 fL      Neutrophils 79.1 %      Lymphocytes Automated 7.0 %      Monocytes 7.0 %      Eosinophils Automated 6.1 %      Basophils Automated 0.4 %      Immature Granulocyte 0.4 %      Nucleated RBC 0.0 /100 WBC      Neutrophils Absolute 5.35 x10 3/uL      Abs Lymph Automated 0.47 (L) x10 3/uL      Abs Mono Automated 0.47 x10 3/uL      Abs Eos Automated 0.41 x10 3/uL      Absolute Baso Automated 0.03 x10 3/uL      Absolute Immature Granulocyte 0.03 x10 3/uL      Absolute NRBC 0.00 x10 3/uL           Recent Labs      02/28/17   0556   WBC  6.76   Hgb  7.9*   Hematocrit  25.7*   Platelets  167       Recent Labs      02/28/17   0556  02/28/17   0555   Sodium  138   --    Potassium  5.0   --  Chloride  106   --    CO2  17*   --    BUN  49*   --    Creatinine  11.9*   --    Glucose  85   --    Calcium  5.8*   --    Magnesium   --   1.7   Phosphorus   --   4.3       Recent Labs      02/28/17   0556   AST (SGOT)  21   ALT  12   Alkaline Phosphatase  227*   Protein, Total  5.8*   Albumin  2.7*       Recent Labs      02/28/17   0556   PT  14.4   PT INR  1.1       No results found for: URINETYPE, COLORUA, CLARITYUA, SPECGRAVUA, URINEPH, NITRITEUA, PROTEINUA, GLUUA, KETONESUA, UROBILIUA, BILIUA, BLOODUA, RBCUA, WBCUA, URINEBACTERI, GRANCASTUA      Rads:   Radiological Procedure reviewed.  Radiology  Results (24 Hour)     Procedure Component Value Units Date/Time    Chest AP Portable [[id number]] Collected:  02/28/17 0804    Order Status:  Completed Updated:  02/28/17 0810    Narrative:       INDICATION: eval for htn urgency    TECHNIQUE: Portable chest performed on 02/28/2017 at 7:45 AM.    COMPARISON: Portable chest 02/18/2017      Impression:       FINDINGS/    No significant change from most recent examination.    Stable mild cardiomegaly. No acute focal consolidation, large pleural  effusion or pneumothorax.    There may be mild venous pulmonary congestion.    Stable left central venous catheter with tip within the right atrium.    Stable likely right subclavian vascular stent.            Max Fickle, MD   02/28/2017 8:06 AM                 Signed by: Cristal Ford, MD

## 2017-02-28 NOTE — ED Provider Notes (Signed)
EMERGENCY DEPARTMENT NOTE    Physician/Midlevel provider first contact with patient: 02/28/17 0634         HISTORY OF PRESENT ILLNESS   Historian:Patient  Translator Used: No    Chief Complaint: Chest Pain; Diarrhea; and Abdominal Pain       46 y.o. male ESRD on HD (MWF), CAD,  HTN, who presents with abdominal pain, chest pain and hematochezia. Of note, pt recently was admitted at [facility] and left AMA on 11/2.  Pt reports symptoms have been ongoing for the past two days. Pt reports current left sided chest discomfort without radiation. Pt also notes vomiting and has been unable to tolerate PO. No fevers or chills. Denies cough or productive sputum.    Pt also reports continued intermittent bright blood in his stool. Pt with prior hx of recurrent c. Diff.  Reports he recently completed a course of abx for PNA. Pt was started on empiric PO vancomycin at [facility] with c dif testing pending.     Pt reports he is followed by [facility] nephrology, however has not followed up for recent dialysis care.     1. Location of symptoms: left chest, abdomen   2. Onset of symptoms: 2 days   3. What was patient doing when symptoms started (Context): see above  4. Severity: moderate  5. Timing: constant   6. Activities that worsen symptoms: n/a  7. Activities that improve symptoms: n/a  8. Quality: stabbing   9. Radiation of symptoms: no  10. Associated signs and Symptoms: see above  11. Are symptoms worsening? yes  MEDICAL HISTORY     Past Medical History:  Past Medical History:   Diagnosis Date   . Asthma    . Clostridium difficile infection    . ESRD (end stage renal disease) on dialysis     HD since 2011   . Hypertension    . Myocardial infarction 2010    2 stents       Past Surgical History:  History reviewed. No pertinent surgical history.    Social History:  Social History     Social History   . Marital status: Single     Spouse name: N/A   . Number of children: N/A   . Years of education: N/A     Occupational History   . Not on  file.     Social History Main Topics   . Smoking status: Current Every Day Smoker     Packs/day: 1.00     Years: 20.00   . Smokeless tobacco: Never Used   . Alcohol use No   . Drug use: Unknown   . Sexual activity: Not on file     Other Topics Concern   . Not on file     Social History Narrative   . No narrative on file       Family History:  Family History   Problem Relation Age of Onset   . Hypertension Mother        Outpatient Medication:  Previous Medications    AMLODIPINE (NORVASC) 10 MG TABLET    Take 10 mg by mouth daily.    CALCIUM ACETATE (PHOSLO) 667 MG CAPSULE    Take 2 capsules (1,334 mg total) by mouth 3 (three) times daily with meals.    CARVEDILOL (COREG) 12.5 MG TABLET    Take 12.5 mg by mouth.    DARBEPOETIN ALFA (ARANESP) 60 MCG/0.3ML INJECTION    Inject 0.3 mLs (60 mcg total) into the  skin once a week.    FUROSEMIDE (LASIX) 80 MG TABLET    Take 80 mg by mouth daily.    HYDRALAZINE (APRESOLINE) 100 MG TABLET    Take 100 mg by mouth 3 (three) times daily.    SODIUM CHLORIDE 0.9 % SOLN 40 ML WITH ALBUTEROL (5 MG/ML) 0.5% NEBU 2.5 MG/ML    Inhale into the lungs as needed.    VANCOMYCIN (VANCOCIN HCL) 125 MG CAPSULE    Take 1 capsule (125 mg total) by mouth 4 (four) times daily.for 10 days         REVIEW OF SYSTEMS   Review of Systems  Constitutional: Negative for fever and chills.   Eyes: Negative for eye discharge and eye redness.   Cardiovascular: Positive for left sided chest pain   Respiratory: Negative for cough and sputum production.    Gastrointestinal: Positive for bloody diarrhea   Genitourinary: Negative for dysuria, urgency and frequency.   Neurological: Negative for dizziness, focal weakness, numbness.   All other systems negative.  PHYSICAL EXAM     ED Triage Vitals   Enc Vitals Group      BP 02/28/17 0539 (!) 182/103      Heart Rate 02/28/17 0539 94      Resp Rate 02/28/17 0534 16      Temp 02/28/17 0539 99 F (37.2 C)      Temp Source 02/28/17 0539 Oral      SpO2 02/28/17 0539 99 %       Weight 02/28/17 0534 104.6 kg      Height --       Head Circumference --       Peak Flow --       Pain Score 02/28/17 0534 10      Pain Loc --       Pain Edu? --       Excl. in GC? --        Constitutional: Vital signs reviewed. Well appearing, well hydrated, well perfused, non-toxic appearing, no apparent distress  Head:  Normocephalic, atraumatic  Eyes: PERRL, normal conjunctiva bilaterally, EOMI  ENT: dry mucous membranes noted   Neck: Normal range of motion. Non-tender.   Respiratory/Chest: clear to auscultation. No work of breathing. No tachypnea..  Cardiovascular: Regular rate and rhythm. No murmur.   Abdomen: Soft and nontender in all quadrants. No guarding or rebound. No masses or hepatosplenomegaly..  Rectal: good tone, brown stool noted   UpperExtremity: No edema or cyanosis.  Neurological: Awake and alert. No focal motor deficits by observation.  Skin: Warm and dry. No rash.  Lymphatic: No cervical lymphadenopathy.    MEDICAL DECISION MAKING     46 y.o. male ESRD on HD (MWF), CAD,  HTN, who presents with abdominal pain, chest pain and hematochezia    hx of demand ischemia with noted elevated troponin   Pt with non-compliance and has not been dialyzed since admission on 11/2 to [facility]     Discussed at length with pt that we will not increased dosage of narcotic pain medication due to concern for dependency. Pt reports he understands.     Given IV hydralazine for htn.   dilaudid 0.2mg  and zofran 4mg  for symptom relief    7:13 AM  - case discussed with Dr. Karilyn Cota, nephrology. Will be seen by service    - case discussed with Dr. Laveda Norman. Will admit to IMG service       DISCUSSION        Vital  Signs: Reviewed the patient?s vital signs.   Nursing Notes: Reviewed and utilized available nursing notes.  Medical Records Reviewed: Reviewed available past medical records.  Counseling: The emergency provider has spoken with the patient and discussed today?s findings, in addition to providing specific details for the plan  of care.  Questions are answered and there is agreement with the plan.      CARDIAC STUDIES    The following cardiac studies were independently interpreted by the Emergency Medicine Physician.  For full cardiac study results please see chart.    Monitor Strip  Interpreted by ED Physician  Rate: 90   Rhythm: NSR   ST Changes: none    EKG Interpretation:  Signed and interpreted by ED Physician   Time Interpreted: 5:36  Comparison: 02/18/17  Rate: 98  Rhythm: NSR  Axis: normal  Intervals: prolonged QT  Blocks: none  ST segments: no acute ST changes   Interpretation: NSR with prolonged QT    IMAGING STUDIES    The following imaging studies were independently interpreted by the Emergency Medicine Physician.  For full imaging study results please see chart.      PULSE OXIMETRY    Oxygen Saturation by Pulse Oximetry: 100%  Interventions: none  Interpretation:  Normal     EMERGENCY DEPT. MEDICATIONS      ED Medication Orders     Start Ordered     Status Ordering Provider    02/28/17 0654 02/28/17 0653  ondansetron (ZOFRAN) injection 4 mg  Once     Route: Intravenous  Ordered Dose: 4 mg     Ordered ADJEI-TWUM, Jovanny Stephanie    02/28/17 5409 02/28/17 0649  HYDROmorphone (DILAUDID) injection 0.2 mg  Once     Route: Intravenous  Ordered Dose: 0.2 mg     Last MAR action:  Given Rulon Abide    02/28/17 0644 02/28/17 0643  hydrALAZINE (APRESOLINE) injection 10 mg  Once     Route: Intravenous  Ordered Dose: 10 mg     Last MAR action:  Given ADJEI-TWUM, Clydell Sposito          LABORATORY RESULTS    Ordered and independently interpreted AVAILABLE laboratory tests. Please see results section in chart for full details.  Results for orders placed or performed during the hospital encounter of 02/28/17   CBC with differential   Result Value Ref Range    WBC 6.76 3.50 - 10.80 x10 3/uL    Hgb 7.9 (L) 13.0 - 17.0 g/dL    Hematocrit 81.1 (L) 42.0 - 52.0 %    Platelets 167 140 - 400 x10 3/uL    RBC 2.65 (L) 4.70 - 6.00 x10 6/uL    MCV 97.0 80.0 - 100.0  fL    MCH 29.8 28.0 - 32.0 pg    MCHC 30.7 (L) 32.0 - 36.0 g/dL    RDW 17 (H) 12 - 15 %    MPV 9.7 9.4 - 12.3 fL    Neutrophils 79.1 None %    Lymphocytes Automated 7.0 None %    Monocytes 7.0 None %    Eosinophils Automated 6.1 None %    Basophils Automated 0.4 None %    Immature Granulocyte 0.4 None %    Nucleated RBC 0.0 0.0 - 1.0 /100 WBC    Neutrophils Absolute 5.35 1.80 - 8.10 x10 3/uL    Abs Lymph Automated 0.47 (L) 0.50 - 4.40 x10 3/uL    Abs Mono Automated 0.47 0.00 - 1.20 x10 3/uL  Abs Eos Automated 0.41 0.00 - 0.70 x10 3/uL    Absolute Baso Automated 0.03 0.00 - 0.20 x10 3/uL    Absolute Immature Granulocyte 0.03 0 x10 3/uL    Absolute NRBC 0.00 0 x10 3/uL   Prothrombin time/INR   Result Value Ref Range    PT 14.4 12.6 - 15.0 sec    PT INR 1.1 0.9 - 1.1    PT Anticoag. Given Within 48 hrs. None    Comprehensive metabolic panel   Result Value Ref Range    Glucose 85 70 - 100 mg/dL    BUN 49 (H) 9 - 28 mg/dL    Creatinine 16.1 (H) 0.7 - 1.3 mg/dL    Sodium 096 045 - 409 mEq/L    Potassium 5.0 3.5 - 5.1 mEq/L    Chloride 106 100 - 111 mEq/L    CO2 17 (L) 22 - 29 mEq/L    Calcium 5.8 (LL) 8.5 - 10.5 mg/dL    Protein, Total 5.8 (L) 6.0 - 8.3 g/dL    Albumin 2.7 (L) 3.5 - 5.0 g/dL    AST (SGOT) 21 5 - 34 U/L    ALT 12 0 - 55 U/L    Alkaline Phosphatase 227 (H) 38 - 106 U/L    Bilirubin, Total 0.6 0.2 - 1.2 mg/dL    Globulin 3.1 2.0 - 3.6 g/dL    Albumin/Globulin Ratio 0.9 0.9 - 2.2    Anion Gap 15.0 5.0 - 15.0   Lipase   Result Value Ref Range    Lipase 91 (H) 8 - 78 U/L   Troponin I   Result Value Ref Range    Troponin I 0.10 (H) 0.00 - 0.09 ng/mL   GFR   Result Value Ref Range    EGFR 4.6    ECG 12 Lead   Result Value Ref Range    Ventricular Rate 98 BPM    Atrial Rate 98 BPM    P-R Interval 176 ms    QRS Duration 82 ms    Q-T Interval 386 ms    QTC Calculation (Bezet) 492 ms    P Axis 70 degrees    R Axis -25 degrees    T Axis 86 degrees         DIAGNOSIS      Diagnosis:  Final diagnoses:   Hypertensive  urgency   Left sided chest pain   Diarrhea, unspecified type       Disposition:  ED Disposition     ED Disposition Condition Date/Time Comment    Admit  Mon Feb 28, 2017  7:07 AM Admitting Physician: Gaynelle Adu [[id number]]   Diagnosis: Hypertensive urgency [478295]   Estimated Length of Stay: > or = to 2 midnights   Tentative Discharge Plan?: Home or Self Care [1]   Patient Class: Inpatient [101]            Prescriptions:  Patient's Medications   New Prescriptions    No medications on file   Previous Medications    AMLODIPINE (NORVASC) 10 MG TABLET    Take 10 mg by mouth daily.    CALCIUM ACETATE (PHOSLO) 667 MG CAPSULE    Take 2 capsules (1,334 mg total) by mouth 3 (three) times daily with meals.    CARVEDILOL (COREG) 12.5 MG TABLET    Take 12.5 mg by mouth.    DARBEPOETIN ALFA (ARANESP) 60 MCG/0.3ML INJECTION    Inject 0.3 mLs (60 mcg total) into the skin once  a week.    FUROSEMIDE (LASIX) 80 MG TABLET    Take 80 mg by mouth daily.    HYDRALAZINE (APRESOLINE) 100 MG TABLET    Take 100 mg by mouth 3 (three) times daily.    SODIUM CHLORIDE 0.9 % SOLN 40 ML WITH ALBUTEROL (5 MG/ML) 0.5% NEBU 2.5 MG/ML    Inhale into the lungs as needed.    VANCOMYCIN (VANCOCIN HCL) 125 MG CAPSULE    Take 1 capsule (125 mg total) by mouth 4 (four) times daily.for 10 days   Modified Medications    No medications on file   Discontinued Medications    No medications on file            Rulon Abide, MD  02/28/17 [phone]

## 2017-02-28 NOTE — ED Notes (Addendum)
Per patient: NO iv in left arm per fistula recently removed. Only in right shoulder although fistula placed. Pt states fistula in right arm is not working.

## 2017-02-28 NOTE — Plan of Care (Signed)
Problem: Safety  Goal: Patient will be free from injury during hospitalization  Outcome: Progressing   02/28/17 1147   Goal/Interventions addressed this shift   Patient will be free from injury during hospitalization  Assess patient's risk for falls and implement fall prevention plan of care per policy;Provide and maintain safe environment;Use appropriate transfer methods;Ensure appropriate safety devices are available at the bedside;Include patient/ family/ care giver in decisions related to safety;Assess for patients risk for elopement and implement Elopement Risk Plan per policy;Hourly rounding       Problem: Altered GI Function  Goal: Fluid and electrolyte balance are achieved/maintained  Outcome: Not Progressing   02/28/17 1207   Goal/Interventions addressed this shift   Fluid and electrolyte balance are achieved/maintained Monitor intake and output every shift;Monitor/assess lab values and report abnormal values;Provide adequate hydration;Assess for confusion/personality changes;Monitor daily weight;Assess and reassess fluid and electrolyte status;Observe for seizure activity and initiate seizure precautions if indicated;Observe for cardiac arrhythmias;Monitor for muscle weakness       Goal: Nutritional intake is adequate  Outcome: Progressing   02/28/17 1207   Goal/Interventions addressed this shift   Nutritional intake is adequate Monitor daily weights;Assist patient with meals/food selection;Allow adequate time for meals     Goal: No bleeding  Outcome: Progressing   02/28/17 1207   Goal/Interventions addressed this shift   No bleeding  Monitor and assess vitals and hemodynamic parameters;Monitor/assess lab values and report abnormal values;Assess for bruising/petechia     Stool occult negative today     Problem: Hemodynamic Status: Cardiac  Goal: Stable vital signs and fluid balance  Outcome: Not Progressing   02/28/17 1207   Goal/Interventions addressed this shift   Stable vital signs and fluid balance  Monitor/assess vital signs and telemetry per unit protocol;Weigh on admission and record weight daily;Assess signs and symptoms associated with cardiac rhythm changes;Monitor intake/output per unit protocol and/or LIP order;Monitor lab values;Monitor for leg swelling/edema and report to LIP if abnormal       Problem: Ineffective Gas Exchange  Goal: Effective breathing pattern  Outcome: Not Progressing   02/28/17 1207   Goal/Interventions addressed this shift   Effective breathing pattern Maintain O2 saturation level per LIP order;Maintain CO2 level per LIP order;Monitor for medication induced respiratory depression

## 2017-02-28 NOTE — Discharge Summary (Signed)
Pt informed soon after admission that some thing personal has come up and he has to go to [name] to attend to that.  He was provided due to information about implications of leaving AGAINST MEDICAL ADVICE and the fact that his urgent dialysis has not even started yet and he has just been admitted. He was made aware of implications including death/ disability  from untreated HTN and missed dialysis .     During his very brief hospitalization, he was noted to be eating adequately but did report nausea vomiting and diarrhea and stool for C. difficile toxin was requested and has not been collected yet.     Please see earlier notes from admission, patient pretty much declined all care and left AGAINST MEDICAL ADVICE.     Arna Medici, MD

## 2017-03-02 LAB — ECG 12-LEAD
Atrial Rate: 98 {beats}/min
P Axis: 70 degrees
P-R Interval: 176 ms
Q-T Interval: 386 ms
QRS Duration: 82 ms
QTC Calculation (Bezet): 492 ms
R Axis: -25 degrees
T Axis: 86 degrees
Ventricular Rate: 98 {beats}/min

## 2017-03-07 ENCOUNTER — Inpatient Hospital Stay
Admission: EM | Admit: 2017-03-07 | Discharge: 2017-03-07 | DRG: 313 | Discharge status: Against Medical Advice | Payer: Medicare Other | Source: Ambulatory Visit | Attending: Critical Care Medicine | Admitting: Critical Care Medicine

## 2017-03-07 ENCOUNTER — Emergency Department: Payer: Medicare Other

## 2017-03-07 DIAGNOSIS — I252 Old myocardial infarction: Secondary | ICD-10-CM

## 2017-03-07 DIAGNOSIS — J45909 Unspecified asthma, uncomplicated: Secondary | ICD-10-CM | POA: Diagnosis present

## 2017-03-07 DIAGNOSIS — I120 Hypertensive chronic kidney disease with stage 5 chronic kidney disease or end stage renal disease: Secondary | ICD-10-CM | POA: Diagnosis present

## 2017-03-07 DIAGNOSIS — R7989 Other specified abnormal findings of blood chemistry: Secondary | ICD-10-CM

## 2017-03-07 DIAGNOSIS — D649 Anemia, unspecified: Secondary | ICD-10-CM | POA: Diagnosis present

## 2017-03-07 DIAGNOSIS — Z8619 Personal history of other infectious and parasitic diseases: Secondary | ICD-10-CM

## 2017-03-07 DIAGNOSIS — R0789 Other chest pain: Principal | ICD-10-CM | POA: Diagnosis present

## 2017-03-07 DIAGNOSIS — N186 End stage renal disease: Secondary | ICD-10-CM | POA: Diagnosis present

## 2017-03-07 DIAGNOSIS — I429 Cardiomyopathy, unspecified: Secondary | ICD-10-CM | POA: Diagnosis present

## 2017-03-07 DIAGNOSIS — R112 Nausea with vomiting, unspecified: Secondary | ICD-10-CM

## 2017-03-07 DIAGNOSIS — Z992 Dependence on renal dialysis: Secondary | ICD-10-CM

## 2017-03-07 DIAGNOSIS — R079 Chest pain, unspecified: Secondary | ICD-10-CM | POA: Diagnosis present

## 2017-03-07 DIAGNOSIS — F17210 Nicotine dependence, cigarettes, uncomplicated: Secondary | ICD-10-CM | POA: Diagnosis present

## 2017-03-07 DIAGNOSIS — Z955 Presence of coronary angioplasty implant and graft: Secondary | ICD-10-CM

## 2017-03-07 DIAGNOSIS — Z8249 Family history of ischemic heart disease and other diseases of the circulatory system: Secondary | ICD-10-CM

## 2017-03-07 DIAGNOSIS — I2510 Atherosclerotic heart disease of native coronary artery without angina pectoris: Secondary | ICD-10-CM | POA: Diagnosis present

## 2017-03-07 DIAGNOSIS — R197 Diarrhea, unspecified: Secondary | ICD-10-CM

## 2017-03-07 DIAGNOSIS — E8770 Fluid overload, unspecified: Secondary | ICD-10-CM | POA: Diagnosis present

## 2017-03-07 LAB — CBC AND DIFFERENTIAL
Absolute NRBC: 0 10*3/uL
Basophils Absolute Automated: 0.02 10*3/uL (ref 0.00–0.20)
Basophils Automated: 0.4 %
Eosinophils Absolute Automated: 0.42 10*3/uL (ref 0.00–0.70)
Eosinophils Automated: 8 %
Hematocrit: 22.9 % — ABNORMAL LOW (ref 42.0–52.0)
Hgb: 7.1 g/dL — ABNORMAL LOW (ref 13.0–17.0)
Immature Granulocytes Absolute: 0.01 10*3/uL
Immature Granulocytes: 0.2 %
Lymphocytes Absolute Automated: 0.36 10*3/uL — ABNORMAL LOW (ref 0.50–4.40)
Lymphocytes Automated: 6.9 %
MCH: 29.7 pg (ref 28.0–32.0)
MCHC: 31 g/dL — ABNORMAL LOW (ref 32.0–36.0)
MCV: 95.8 fL (ref 80.0–100.0)
MPV: 9.4 fL (ref 9.4–12.3)
Monocytes Absolute Automated: 0.4 10*3/uL (ref 0.00–1.20)
Monocytes: 7.6 %
Neutrophils Absolute: 4.02 10*3/uL (ref 1.80–8.10)
Neutrophils: 76.9 %
Nucleated RBC: 0 /100 WBC (ref 0.0–1.0)
Platelets: 139 10*3/uL — ABNORMAL LOW (ref 140–400)
RBC: 2.39 10*6/uL — ABNORMAL LOW (ref 4.70–6.00)
RDW: 16 % — ABNORMAL HIGH (ref 12–15)
WBC: 5.23 10*3/uL (ref 3.50–10.80)

## 2017-03-07 LAB — COMPREHENSIVE METABOLIC PANEL
ALT: 11 U/L (ref 0–55)
AST (SGOT): 18 U/L (ref 5–34)
Albumin/Globulin Ratio: 1 (ref 0.9–2.2)
Albumin: 2.8 g/dL — ABNORMAL LOW (ref 3.5–5.0)
Alkaline Phosphatase: 195 U/L — ABNORMAL HIGH (ref 38–106)
Anion Gap: 16 — ABNORMAL HIGH (ref 5.0–15.0)
BUN: 54 mg/dL — ABNORMAL HIGH (ref 9–28)
Bilirubin, Total: 0.7 mg/dL (ref 0.2–1.2)
CO2: 17 mEq/L — ABNORMAL LOW (ref 22–29)
Calcium: 6.1 mg/dL — ABNORMAL LOW (ref 8.5–10.5)
Chloride: 106 mEq/L (ref 100–111)
Creatinine: 10.9 mg/dL — ABNORMAL HIGH (ref 0.7–1.3)
Globulin: 2.7 g/dL (ref 2.0–3.6)
Glucose: 88 mg/dL (ref 70–100)
Potassium: 5.1 mEq/L (ref 3.5–5.1)
Protein, Total: 5.5 g/dL — ABNORMAL LOW (ref 6.0–8.3)
Sodium: 139 mEq/L (ref 136–145)

## 2017-03-07 LAB — MAGNESIUM: Magnesium: 1.7 mg/dL (ref 1.6–2.6)

## 2017-03-07 LAB — ECG 12-LEAD
Atrial Rate: 105 {beats}/min
P Axis: 66 degrees
P-R Interval: 170 ms
Q-T Interval: 382 ms
QRS Duration: 76 ms
QTC Calculation (Bezet): 504 ms
R Axis: -10 degrees
T Axis: 84 degrees
Ventricular Rate: 105 {beats}/min

## 2017-03-07 LAB — GFR: EGFR: 6.2

## 2017-03-07 LAB — PHOSPHORUS: Phosphorus: 4.5 mg/dL (ref 2.3–4.7)

## 2017-03-07 LAB — TROPONIN I: Troponin I: 0.11 ng/mL — ABNORMAL HIGH (ref 0.00–0.09)

## 2017-03-07 MED ORDER — PROMETHAZINE HCL 25 MG/ML IJ SOLN
12.5000 mg | Freq: Once | INTRAMUSCULAR | Status: AC
Start: 2017-03-07 — End: 2017-03-07
  Administered 2017-03-07: 14:00:00 12.5 mg via INTRAVENOUS
  Filled 2017-03-07: qty 1

## 2017-03-07 MED ORDER — NITROGLYCERIN IN D5W 200-5 MCG/ML-% IV SOLN
20.0000 ug/min | INTRAVENOUS | Status: DC
Start: 2017-03-07 — End: 2017-03-07
  Administered 2017-03-07: 19:00:00 5 ug/min via INTRAVENOUS
  Filled 2017-03-07: qty 250

## 2017-03-07 MED ORDER — ONDANSETRON HCL 4 MG/2ML IJ SOLN
4.0000 mg | Freq: Once | INTRAMUSCULAR | Status: AC
Start: 2017-03-07 — End: 2017-03-07
  Administered 2017-03-07: 17:00:00 4 mg via INTRAVENOUS
  Filled 2017-03-07: qty 2

## 2017-03-07 MED ORDER — NITROGLYCERIN 0.4 MG SL SUBL
0.4000 mg | SUBLINGUAL_TABLET | SUBLINGUAL | Status: DC | PRN
Start: 2017-03-07 — End: 2017-03-07
  Administered 2017-03-07 (×2): 0.4 mg via SUBLINGUAL
  Filled 2017-03-07: qty 1

## 2017-03-07 MED ORDER — AMLODIPINE BESYLATE 5 MG PO TABS
10.0000 mg | ORAL_TABLET | Freq: Every day | ORAL | Status: DC
Start: 2017-03-07 — End: 2017-03-07

## 2017-03-07 MED ORDER — HYDROMORPHONE HCL 1 MG/ML IJ SOLN
0.5000 mg | Freq: Once | INTRAMUSCULAR | Status: AC
Start: 2017-03-07 — End: 2017-03-07
  Administered 2017-03-07: 16:00:00 0.5 mg via INTRAVENOUS
  Filled 2017-03-07: qty 0.5

## 2017-03-07 MED ORDER — CARVEDILOL 6.25 MG PO TABS
12.5000 mg | ORAL_TABLET | Freq: Two times a day (BID) | ORAL | Status: DC
Start: 2017-03-07 — End: 2017-03-07
  Filled 2017-03-07: qty 1

## 2017-03-07 MED ORDER — HYDRALAZINE HCL 25 MG PO TABS
50.0000 mg | ORAL_TABLET | Freq: Three times a day (TID) | ORAL | Status: DC
Start: 2017-03-07 — End: 2017-03-07

## 2017-03-07 MED ORDER — FAMOTIDINE 10 MG/ML IV SOLN (WRAP)
20.0000 mg | Freq: Once | INTRAVENOUS | Status: AC
Start: 2017-03-07 — End: 2017-03-07
  Administered 2017-03-07: 17:00:00 20 mg via INTRAVENOUS
  Filled 2017-03-07: qty 2

## 2017-03-07 NOTE — Progress Notes (Signed)
Patient requested dilaudid for pain.   Reports that he has had N/V/D for 3 days and unable to take anything down.  Reports abdominal pain, left sided chest pressure.  Initially reported vomiting 2 hours ago.  Although, when I asked the nurse to check when he last vomited in the ED, he said it was prior to arrival to the ED.    Initially refused to take oral medications due to nausea; then changed the story that his BP will bottom out after dialysis if taking them.  Then he said that he will take them when I told him, he will not be dialyzed tonight.     Then he requested something for pain.  Stated that he got dilaudid in the ED.  I offered him tylenol.  Then he told me to get me "the form."  When I asked him which form, he said the AMA form.      He has long standing hx of non-compliance, drug seeking behavior and leaving AMA from hospital in the past.      He is going to leave AMA. Understands risks.

## 2017-03-07 NOTE — ED Notes (Signed)
Pt. Was offered his ordered  BP meds. Pt. Refused BP meds saying "I want to take these after I go to Dialysis". MD aware.Noted.

## 2017-03-07 NOTE — ED Notes (Signed)
Pt requests pain medication informed that concern right now is BP control and have notified MD. Pt stated that pain is making BP high.  Pt keeps removing O2 probe.  Requested pt to keep on due to medication and importance of monitoring at this time.

## 2017-03-07 NOTE — ED Provider Notes (Addendum)
EMERGENCY DEPARTMENT NOTE    Physician/Midlevel provider first contact with patient: 03/07/17 1351         HISTORY OF PRESENT ILLNESS   Historian:Patient  Translator Used: No    Chief Complaint: Chest Pain; Emesis; Diarrhea; and Nausea     Mechanism of Injury:       46 y.o. male with h/o ESRD on dialysis and last dialysis (03/03/17) and CAD s/p stents x2 presents to the ED complaining of chest pain today.  Has been having diarrhea that seems similar to prior c diff episodes.  No abd pain.  No fevers.    1. Location of symptoms: chest  2. Onset of symptoms: several hours ago  3. What was patient doing when symptoms started (Context): see above  4. Severity: moderate  5. Timing: constant  6. Activities that worsen symptoms: nothing  7. Activities that improve symptoms: nothing  8. Quality: pressure  9. Radiation of symptoms: no  10. Associated signs and Symptoms: see above  11. Are symptoms worsening? yes  MEDICAL HISTORY     Past Medical History:  Past Medical History:   Diagnosis Date   . Asthma    . Clostridium difficile infection    . ESRD (end stage renal disease) on dialysis     HD since 2011   . Hypertension    . Myocardial infarction 2010    2 stents       Past Surgical History:  Past Surgical History:   Procedure Laterality Date   . HERNIA REPAIR  2016       Social History:  Social History     Social History   . Marital status: Single     Spouse name: N/A   . Number of children: N/A   . Years of education: N/A     Occupational History   . Not on file.     Social History Main Topics   . Smoking status: Current Every Day Smoker     Packs/day: 0.25     Years: 20.00   . Smokeless tobacco: Never Used   . Alcohol use No   . Drug use: No   . Sexual activity: Not on file     Other Topics Concern   . Not on file     Social History Narrative   . No narrative on file       Family History:  Family History   Problem Relation Age of Onset   . Hypertension Mother        Outpatient Medication:  Previous Medications     ALBUTEROL (PROVENTIL HFA;VENTOLIN HFA) 108 (90 BASE) MCG/ACT INHALER    Inhale into the lungs.    ALBUTEROL (PROVENTIL HFA;VENTOLIN HFA) 108 (90 BASE) MCG/ACT INHALER    Take 180 mcg inhaled by mouth Every 4 Hours As Needed for Wheezing or Shortness of Breath.    ALBUTEROL (PROVENTIL) (2.5 MG/3ML) 0.083% NEBULIZER SOLUTION    Take 2.5 mg by nebulization daily    AMLODIPINE (NORVASC) 10 MG TABLET    Take 10 mg by mouth daily.    CALCIUM ACETATE (PHOSLO) 667 MG CAPSULE    Take 2 capsules (1,334 mg total) by mouth 3 (three) times daily with meals.    CARVEDILOL (COREG) 12.5 MG TABLET    Take 12.5 mg by mouth 2 (two) times daily with meals.        DARBEPOETIN ALFA (ARANESP) 60 MCG/0.3ML INJECTION    Inject 0.3 mLs (60 mcg total) into the skin  once a week.    FUROSEMIDE (LASIX) 80 MG TABLET    Take 80 mg by mouth daily.    HYDRALAZINE (APRESOLINE) 100 MG TABLET    Take 100 mg by mouth 3 (three) times daily.    SEVELAMER (RENAGEL) 400 MG TABLET    Take by mouth.    SODIUM CHLORIDE 0.9 % SOLN 40 ML WITH ALBUTEROL (5 MG/ML) 0.5% NEBU 2.5 MG/ML    Inhale into the lungs as needed.         REVIEW OF SYSTEMS   Review of Systems   Constitutional: Negative.  Negative for chills and fever.   Respiratory: Positive for shortness of breath. Negative for cough.    Cardiovascular: Positive for chest pain.   Gastrointestinal: Positive for diarrhea, nausea and vomiting. Negative for abdominal pain.   Neurological: Negative.  Negative for dizziness and headaches.   All other systems reviewed and are negative.         PHYSICAL EXAM     ED Triage Vitals [03/07/17 1347]   Enc Vitals Group      BP (!) 179/97      Heart Rate (!) 106      Resp Rate 18      Temp 98 F (36.7 C)      Temp Source Oral      SpO2 100 %      Weight       Height       Head Circumference       Peak Flow       Pain Score 10      Pain Loc       Pain Edu?       Excl. in GC?      Physical Exam   Constitutional: He is oriented to person, place, and time. He appears  well-developed and well-nourished. No distress.   HENT:   Head: Normocephalic and atraumatic.   Mouth/Throat: Oropharynx is clear and moist.   Eyes: Pupils are equal, round, and reactive to light. Conjunctivae are normal.   Neck: Normal range of motion. Neck supple.   Cardiovascular: Normal rate, regular rhythm and normal heart sounds.    Pulmonary/Chest: Effort normal and breath sounds normal.   Abdominal: Soft. There is no tenderness. There is no rebound and no guarding.   Neurological: He is alert and oriented to person, place, and time.   Skin: Skin is warm and dry.   Nursing note and vitals reviewed.      MEDICAL DECISION MAKING     DISCUSSION    Patient with ESRD and chest pain.  ECG with no acute changes.  Electrolyes and labs with no urgent indication for dialysis.  Troponin at baseline.  Will admit for rule out.  Discussed patient's prior AMA and patient states he will stay in the hospital.  Discussed with Dr. Franchot Erichsen.  Discussed with Dr. Elise Benne.  Discussed with Dr. Doristine Section.      Patient with persistent chest pain.  Started on nitro gtt by Dr. Franchot Erichsen.  Discussed with Dr. Leron Croak for ICU admission.      Vital Signs: Reviewed the patient?s vital signs.   Nursing Notes: Reviewed and utilized available nursing notes.  Medical Records Reviewed: Reviewed available past medical records.  Counseling: The emergency provider has spoken with the patient and discussed today?s findings, in addition to providing specific details for the plan of care.  Questions are answered and there is agreement with the plan.  CARDIAC STUDIES    The following cardiac studies were independently interpreted by the Emergency Medicine Physician.  For full cardiac study results please see chart.    Monitor Strip  Interpreted by ED Physician  Rate: 96  Rhythm: NSR   ST Changes: none    EKG Interpretation:  Signed and interpreted byED Physician   Time Interpreted: 1350  Rate: 106  Rhythm: Sinus tachycardia  Axis:  normal  Intervals: normal  Blocks: none  ST segments: no acute changes  Interpretation: Nonspecific  EKG    RADIOLOGY IMAGING STUDIES      XR Chest 2 Views   Final Result    Vascular redistribution. No airspace infiltrate.          Heron Nay, MD    03/07/2017 3:31 PM        PULSE OXIMETRY    Oxygen Saturation by Pulse Oximetry: 100%  Interventions: none  Interpretation:  Normal     EMERGENCY DEPT. MEDICATIONS      ED Medication Orders     Start Ordered     Status Ordering Provider    03/07/17 1503 03/07/17 1502  HYDROmorphone (DILAUDID) injection 0.5 mg  Once     Route: Intravenous  Ordered Dose: 0.5 mg     Last MAR action:  Given Larina Bras    03/07/17 1354 03/07/17 1353  promethazine (PHENERGAN) injection 12.5 mg  Once     Route: Intravenous  Ordered Dose: 12.5 mg     Last MAR action:  Given Larina Bras    03/07/17 1353 03/07/17 1353  nitroglycerin (NITROSTAT) SL tablet 0.4 mg  Every 5 min PRN     Route: Sublingual  Ordered Dose: 0.4 mg     Last MAR action:  Given Bonna Steury KAEHLER          LABORATORY RESULTS    Ordered and independently interpreted AVAILABLE laboratory tests. Please see results section in chart for full details.  Results for orders placed or performed during the hospital encounter of 03/07/17   CBC with differential   Result Value Ref Range    WBC 5.23 3.50 - 10.80 x10 3/uL    Hgb 7.1 (L) 13.0 - 17.0 g/dL    Hematocrit 16.1 (L) 42.0 - 52.0 %    Platelets 139 (L) 140 - 400 x10 3/uL    RBC 2.39 (L) 4.70 - 6.00 x10 6/uL    MCV 95.8 80.0 - 100.0 fL    MCH 29.7 28.0 - 32.0 pg    MCHC 31.0 (L) 32.0 - 36.0 g/dL    RDW 16 (H) 12 - 15 %    MPV 9.4 9.4 - 12.3 fL    Neutrophils 76.9 None %    Lymphocytes Automated 6.9 None %    Monocytes 7.6 None %    Eosinophils Automated 8.0 None %    Basophils Automated 0.4 None %    Immature Granulocyte 0.2 None %    Nucleated RBC 0.0 0.0 - 1.0 /100 WBC    Neutrophils Absolute 4.02 1.80 - 8.10 x10 3/uL    Abs Lymph Automated 0.36  (L) 0.50 - 4.40 x10 3/uL    Abs Mono Automated 0.40 0.00 - 1.20 x10 3/uL    Abs Eos Automated 0.42 0.00 - 0.70 x10 3/uL    Absolute Baso Automated 0.02 0.00 - 0.20 x10 3/uL    Absolute Immature Granulocyte 0.01 0 x10 3/uL    Absolute NRBC 0.00 0 x10 3/uL  Comprehensive metabolic panel   Result Value Ref Range    Glucose 88 70 - 100 mg/dL    BUN 54 (H) 9 - 28 mg/dL    Creatinine 46.9 (H) 0.7 - 1.3 mg/dL    Sodium 629 528 - 413 mEq/L    Potassium 5.1 3.5 - 5.1 mEq/L    Chloride 106 100 - 111 mEq/L    CO2 17 (L) 22 - 29 mEq/L    Calcium 6.1 (L) 8.5 - 10.5 mg/dL    Protein, Total 5.5 (L) 6.0 - 8.3 g/dL    Albumin 2.8 (L) 3.5 - 5.0 g/dL    AST (SGOT) 18 5 - 34 U/L    ALT 11 0 - 55 U/L    Alkaline Phosphatase 195 (H) 38 - 106 U/L    Bilirubin, Total 0.7 0.2 - 1.2 mg/dL    Globulin 2.7 2.0 - 3.6 g/dL    Albumin/Globulin Ratio 1.0 0.9 - 2.2    Anion Gap 16.0 (H) 5.0 - 15.0   Magnesium   Result Value Ref Range    Magnesium 1.7 1.6 - 2.6 mg/dL   Troponin I   Result Value Ref Range    Troponin I 0.11 (H) 0.00 - 0.09 ng/mL   Phosphorus   Result Value Ref Range    Phosphorus 4.5 2.3 - 4.7 mg/dL   GFR   Result Value Ref Range    EGFR 6.1    ECG 12 Lead   Result Value Ref Range    Ventricular Rate 105 BPM    Atrial Rate 105 BPM    P-R Interval 170 ms    QRS Duration 76 ms    Q-T Interval 382 ms    QTC Calculation (Bezet) 504 ms    P Axis 66 degrees    R Axis -10 degrees    T Axis 84 degrees       CRITICAL CARE/PROCEDURES    Procedures    DIAGNOSIS      Diagnosis:  Final diagnoses:   Chest pain in adult   Diarrhea in adult patient   ESRD on dialysis       Disposition:  ED Disposition     ED Disposition Condition Date/Time Comment    Observation  Mon Mar 07, 2017  4:34 PM Admitting Physician: Cheree Ditto [facility] [[id number]]   Diagnosis: Chest pain in adult [0272536]   Estimated Length of Stay: < 2 midnights   Tentative Discharge Plan?: Home or Self Care [1]   Patient Class: Observation [104]            Prescriptions:  Patient's  Medications   New Prescriptions    No medications on file   Previous Medications    ALBUTEROL (PROVENTIL HFA;VENTOLIN HFA) 108 (90 BASE) MCG/ACT INHALER    Inhale into the lungs.    ALBUTEROL (PROVENTIL HFA;VENTOLIN HFA) 108 (90 BASE) MCG/ACT INHALER    Take 180 mcg inhaled by mouth Every 4 Hours As Needed for Wheezing or Shortness of Breath.    ALBUTEROL (PROVENTIL) (2.5 MG/3ML) 0.083% NEBULIZER SOLUTION    Take 2.5 mg by nebulization daily    AMLODIPINE (NORVASC) 10 MG TABLET    Take 10 mg by mouth daily.    CALCIUM ACETATE (PHOSLO) 667 MG CAPSULE    Take 2 capsules (1,334 mg total) by mouth 3 (three) times daily with meals.    CARVEDILOL (COREG) 12.5 MG TABLET    Take 12.5 mg by mouth 2 (two) times daily with meals.  DARBEPOETIN ALFA (ARANESP) 60 MCG/0.3ML INJECTION    Inject 0.3 mLs (60 mcg total) into the skin once a week.    FUROSEMIDE (LASIX) 80 MG TABLET    Take 80 mg by mouth daily.    HYDRALAZINE (APRESOLINE) 100 MG TABLET    Take 100 mg by mouth 3 (three) times daily.    SEVELAMER (RENAGEL) 400 MG TABLET    Take by mouth.    SODIUM CHLORIDE 0.9 % SOLN 40 ML WITH ALBUTEROL (5 MG/ML) 0.5% NEBU 2.5 MG/ML    Inhale into the lungs as needed.   Modified Medications    No medications on file   Discontinued Medications    No medications on file            Larina Bras, MD  03/07/17 1656       Larina Bras, MD  03/07/17 [phone]

## 2017-03-07 NOTE — ED Notes (Signed)
Pt refused all medications for BP due to per pt "I bottom out after dialysis".  Pt stated " I want to wait till dialysis happens then I will take medications.  Notified Dr. Everlene Balls and was told to mark medications as refused at this time.

## 2017-03-07 NOTE — Consults (Addendum)
Consults   CONSULTATION    Date Time: 03/07/17 4:39 PM  Patient Name: [name]  Requesting Physician: Larina Bras*      Reason for Consultation:   Chest discomfort    Assessment:   1. Nausea and vomiting the past 3 days since 03/04/17 and patient has not been able to keep medication down since then.   2. Ankle swelling noted by the patient this morning.  3. ESRD on HD and last dialysis was 03/04/17.  4. Chest heaviness continuous since this morning radiating to the left arm and jaw, atypical, will follow.   5. Fluid overload. Dry weight is 104 kg, don't have a weight yet from today.   6. Elevated troponin at 0.11 similar to findings on 02/18/17 when the patient was admitted for chest discomfort and signed out AMA. Doubt nstemi. Will not fully anticoagulate.   7. Cardiomyopathy of uncertain type, ? Ischemia vs hypertensive EF 35% on echo 02/18/17  8. Hypertension not controlled.   9. Hx of 2 stents placed 2010 at [name].   10. Severe anemia with same values 02/18/17.   11. Hx of angioedema on lisinopril and aspirin.           Plan:   Dialysis  IV nitroglycerine for BP and chest discomfort and decrease filling pressures.  Start regular home medications.   Follow troponins  Eventual stress nuclear scan  After stabilization outpatient consideration for ICD          History:   [name] is a 45 y.o. male who presents to the hospital on 03/07/2017 with ESRD on HD since 2011 with a recent hospital stay 02/18/17 for chest pain and mild troponin elevations presents again now with similar symptoms. The patient has had nausea and vomiting since 03/04/17 and has not been able to keep his medications down for this reason he states. The patient has CAD and had 2 stents placed 2010 at [facility].     Past Medical History:     Past Medical History:   Diagnosis Date   . Asthma    . Clostridium difficile infection    . ESRD (end stage renal disease) on dialysis     HD since 2011   .  Hypertension    . Myocardial infarction 2010    2 stents       Past Surgical History:     Past Surgical History:   Procedure Laterality Date   . HERNIA REPAIR  2016       Family History:     Family History   Problem Relation Age of Onset   . Hypertension Mother        Social History:     Social History     Social History   . Marital status: Single     Spouse name: N/A   . Number of children: N/A   . Years of education: N/A     Social History Main Topics   . Smoking status: Current Every Day Smoker     Packs/day: 0.25     Years: 20.00   . Smokeless tobacco: Never Used   . Alcohol use No   . Drug use: No   . Sexual activity: Not on file     Other Topics Concern   . Not on file     Social History Narrative   . No narrative on file       Allergies:     Allergies  Allergen Reactions   . Asa [Aspirin] Angioedema     Facial, lip swelling and throat tightness   . Lisinopril Angioedema   . Morphine Angioedema     Tolerated dilaudid 11//02/18   . Toradol [Ketorolac Tromethamine] Angioedema       Medications:     Current Facility-Administered Medications   Medication Dose Route Frequency       Review of Systems:   A comprehensive review of systems was: History obtained from chart review and the patient  General ROS: negative  Psychological ROS: negative  Respiratory ROS: positive for - shortness of breath  Cardiovascular ROS: positive for - chest pain  Gastrointestinal ROS: positive for - nausea/vomiting  Genito-Urinary ROS: no dysuria, trouble voiding, or hematuria  Musculoskeletal ROS: negative  Neurological ROS: no TIA or stroke symptoms  Dermatological ROS: negative    Physical Exam:     Vitals:    03/07/17 1600   BP: (!) 172/105   Pulse: 96   Resp: 20   Temp: 98 F (36.7 C)   SpO2: 100%       Intake and Output Summary (Last 24 hours) at Date Time  No intake or output data in the 24 hours ending 03/07/17 1639    General appearance - chronically ill appearing  Mental status - alert, oriented to person, place, and  time  Mouth - mucous membranes moist, pharynx normal without lesions  Chest - clear to auscultation, no wheezes, rales or rhonchi, symmetric air entry  Heart - S1 and S2 normal, S3 present  Abdomen - tenderness noted diffusely over the abdomen  Neurological - alert, oriented, normal speech, no focal findings or movement disorder noted  Musculoskeletal - no joint tenderness, deformity or swelling  Extremities - pedal edema 2 +  Skin - normal coloration and turgor, no rashes, no suspicious skin lesions noted    Labs Reviewed:     Results     Procedure Component Value Units Date/Time    Troponin I [[id number]]  (Abnormal) Collected:  03/07/17 1409    Specimen:  Blood Updated:  03/07/17 1458     Troponin I 0.11 (H) ng/mL     Magnesium [[id number]] Collected:  03/07/17 1409    Specimen:  Blood Updated:  03/07/17 1451     Magnesium 1.7 mg/dL     Phosphorus [[id number]] Collected:  03/07/17 1409    Specimen:  Blood Updated:  03/07/17 1451     Phosphorus 4.5 mg/dL     GFR [[id number]] Collected:  03/07/17 1409     Updated:  03/07/17 1451     EGFR 6.1    Comprehensive metabolic panel [[id number]]  (Abnormal) Collected:  03/07/17 1409    Specimen:  Blood Updated:  03/07/17 1451     Glucose 88 mg/dL      BUN 54 (H) mg/dL      Creatinine 60.1 (H) mg/dL      Sodium 093 mEq/L      Potassium 5.1 mEq/L      Chloride 106 mEq/L      CO2 17 (L) mEq/L      Calcium 6.1 (L) mg/dL      Protein, Total 5.5 (L) g/dL      Albumin 2.8 (L) g/dL      AST (SGOT) 18 U/L      ALT 11 U/L      Alkaline Phosphatase 195 (H) U/L      Bilirubin, Total 0.7 mg/dL      Globulin 2.7 g/dL  Albumin/Globulin Ratio 1.0     Anion Gap 16.0 (H)    CBC with differential [[id number]]  (Abnormal) Collected:  03/07/17 1409    Specimen:  Blood from Blood Updated:  03/07/17 1435     WBC 5.23 x10 3/uL      Hgb 7.1 (L) g/dL      Hematocrit 47.8 (L) %      Platelets 139 (L) x10 3/uL      RBC 2.39 (L) x10 6/uL      MCV 95.8 fL      MCH 29.7 pg      MCHC 31.0 (L) g/dL      RDW  16 (H) %      MPV 9.4 fL      Neutrophils 76.9 %      Lymphocytes Automated 6.9 %      Monocytes 7.6 %      Eosinophils Automated 8.0 %      Basophils Automated 0.4 %      Immature Granulocyte 0.2 %      Nucleated RBC 0.0 /100 WBC      Neutrophils Absolute 4.02 x10 3/uL      Abs Lymph Automated 0.36 (L) x10 3/uL      Abs Mono Automated 0.40 x10 3/uL      Abs Eos Automated 0.42 x10 3/uL      Absolute Baso Automated 0.02 x10 3/uL      Absolute Immature Granulocyte 0.01 x10 3/uL      Absolute NRBC 0.00 x10 3/uL           Recent CBC   Recent Labs      03/07/17   1409   RBC  2.39*   Hgb  7.1*   Hematocrit  22.9*   MCV  95.8   MCH  29.7   MCHC  31.0*   RDW  16*   MPV  9.4       Rads:   Radiological Procedure reviewed.     Signed by: Royann Shivers

## 2017-03-07 NOTE — Progress Notes (Signed)
Patient requested to leave Against Medical Leave [facility]).  Patient educated about elevated BP, and increase work of breathing. Form signed at 1941 and left CCU at 1944.

## 2017-03-12 ENCOUNTER — Emergency Department
Admission: EM | Admit: 2017-03-12 | Discharge: 2017-03-12 | Disposition: A | Discharge status: Home / Self Care | Payer: Medicare Other | Attending: Emergency Medicine | Admitting: Emergency Medicine

## 2017-03-12 DIAGNOSIS — Z79899 Other long term (current) drug therapy: Secondary | ICD-10-CM | POA: Insufficient documentation

## 2017-03-12 DIAGNOSIS — Z5321 Procedure and treatment not carried out due to patient leaving prior to being seen by health care provider: Secondary | ICD-10-CM

## 2017-03-12 DIAGNOSIS — N186 End stage renal disease: Secondary | ICD-10-CM | POA: Insufficient documentation

## 2017-03-12 DIAGNOSIS — I120 Hypertensive chronic kidney disease with stage 5 chronic kidney disease or end stage renal disease: Secondary | ICD-10-CM | POA: Insufficient documentation

## 2017-03-12 DIAGNOSIS — R197 Diarrhea, unspecified: Secondary | ICD-10-CM | POA: Insufficient documentation

## 2017-03-12 DIAGNOSIS — J45909 Unspecified asthma, uncomplicated: Secondary | ICD-10-CM | POA: Insufficient documentation

## 2017-03-12 DIAGNOSIS — R109 Unspecified abdominal pain: Secondary | ICD-10-CM | POA: Insufficient documentation

## 2017-03-12 DIAGNOSIS — F17210 Nicotine dependence, cigarettes, uncomplicated: Secondary | ICD-10-CM | POA: Insufficient documentation

## 2017-03-12 DIAGNOSIS — I252 Old myocardial infarction: Secondary | ICD-10-CM | POA: Insufficient documentation

## 2017-03-12 LAB — COMPREHENSIVE METABOLIC PANEL
ALT: 18 U/L (ref 0–55)
AST (SGOT): 20 U/L (ref 5–34)
Albumin/Globulin Ratio: 0.9 (ref 0.9–2.2)
Albumin: 2.9 g/dL — ABNORMAL LOW (ref 3.5–5.0)
Alkaline Phosphatase: 297 U/L — ABNORMAL HIGH (ref 38–106)
BUN: 93 mg/dL — ABNORMAL HIGH (ref 9.0–28.0)
Bilirubin, Total: 0.7 mg/dL (ref 0.2–1.2)
CO2: 16 mEq/L — ABNORMAL LOW (ref 22–29)
Calcium: 6.3 mg/dL — ABNORMAL LOW (ref 8.5–10.5)
Chloride: 105 mEq/L (ref 100–111)
Creatinine: 13.1 mg/dL — ABNORMAL HIGH (ref 0.7–1.3)
Globulin: 3.2 g/dL (ref 2.0–3.6)
Glucose: 106 mg/dL — ABNORMAL HIGH (ref 70–100)
Potassium: 5.8 mEq/L — ABNORMAL HIGH (ref 3.5–5.1)
Protein, Total: 6.1 g/dL (ref 6.0–8.3)
Sodium: 138 mEq/L (ref 136–145)

## 2017-03-12 LAB — CBC AND DIFFERENTIAL
Absolute NRBC: 0 10*3/uL
Basophils Absolute Automated: 0.02 10*3/uL (ref 0.00–0.20)
Basophils Automated: 0.4 %
Eosinophils Absolute Automated: 0.33 10*3/uL (ref 0.00–0.70)
Eosinophils Automated: 6.9 %
Hematocrit: 24.3 % — ABNORMAL LOW (ref 42.0–52.0)
Hgb: 7.7 g/dL — ABNORMAL LOW (ref 13.0–17.0)
Immature Granulocytes Absolute: 0.02 10*3/uL
Immature Granulocytes: 0.4 %
Lymphocytes Absolute Automated: 0.31 10*3/uL — ABNORMAL LOW (ref 0.50–4.40)
Lymphocytes Automated: 6.5 %
MCH: 30.8 pg (ref 28.0–32.0)
MCHC: 31.7 g/dL — ABNORMAL LOW (ref 32.0–36.0)
MCV: 97.2 fL (ref 80.0–100.0)
MPV: 9.8 fL (ref 9.4–12.3)
Monocytes Absolute Automated: 0.33 10*3/uL (ref 0.00–1.20)
Monocytes: 6.9 %
Neutrophils Absolute: 3.76 10*3/uL (ref 1.80–8.10)
Neutrophils: 78.9 %
Nucleated RBC: 0 /100 WBC (ref 0.0–1.0)
Platelets: 113 10*3/uL — ABNORMAL LOW (ref 140–400)
RBC: 2.5 10*6/uL — ABNORMAL LOW (ref 4.70–6.00)
RDW: 16 % — ABNORMAL HIGH (ref 12–15)
WBC: 4.77 10*3/uL (ref 3.50–10.80)

## 2017-03-12 LAB — MAGNESIUM: Magnesium: 1.7 mg/dL (ref 1.6–2.6)

## 2017-03-12 LAB — LIPASE: Lipase: 60 U/L (ref 8–78)

## 2017-03-12 LAB — GFR: EGFR: 5

## 2017-03-12 NOTE — ED Notes (Signed)
Triage Note    Initial evaluation to expedite care - not definitive treatment.  46 yo male with ESRD, heart problems.  Here with abdominal pain, diarrhea.  Recent ED visit for same.  See FYI, Care Everywhere.     Maurine Minister, MD  03/12/17 [phone]

## 2017-03-12 NOTE — EDIE (Signed)
[name]?NOTIFICATION?03/12/2017 17:33?Allex, Lapoint D?MRN: [id number]    This patient has registered at the [facility]   For more information visit: [email or web address]   Criteria met      3 Different Facilities in 90 Days    5 ED Visits in 12 Months    Security Events  No recent Security Events currently on file    ED Care Guidelines  There are currently no ED Care Guidelines in [place] for this patient. Please check your facility's medical records system.    Care Providers  Provider [facility] Type Phone Fax Service Dates   MATTHEW [facility] Primary Care   Current      E.D. Visit Count (12 mo.)  Facility Visits   [facility] 7   [facility] 2   [facility] 1   [facility] 1   [facility] 1   [facility] 2   [facility] 1   [facility] 6   [facility] 2   [facility] [address]   [facility] 1   [facility] 4   [facility] 1   [facility] 24   [facility] 2   [facility] 1   [facility] 2   [facility] 3   J.WEliezer Champagne 2   [place] [facility] 2   [facility] 3   [facility] 1   [facility] [place] [facility] 3   [facility] 2   [facility] [place] 2   [facility] 5   [facility] 4   [facility] 2   [facility] 2   [facility] 1   [facility] 1   [facility] 1   [facility] 2   [facility] 1   [facility] 4   [facility] 2   [facility] 3   [facility] 1   [facility] 1   [facility] 1   [facility] 1   [facility] 1   [facility] 16   [facility] 2   [facility] 2   [facility] 10   [facility] 1   [facility] 1   [facility] 1   [facility] 1   [facility] 1   [facility] 1   [facility] 1   Total 169   Note: Visits indicate total known visits.      Recent Emergency Department Visit Summary  Showing 20 most recent visits out of 60 in the past 3 months  Admit Date Facility [facility] Type Major Type Diagnoses or Chief Complaint   Mar 12, 2017 [place] [place]. [place] Emergency  Emergency      Triage  Mar 07, 2017 [facility] [place]. [place] [facility]  Emergency     Mar 07, 2017 [facility] [place]. [place] Emergency  Emergency     Mar 07, 2017 [facility] [facility] [place]. [place] Emergency  Emergency      Chest Pain      Hypocalcemia      Mar 07, 2017 [place] - Shea Stakes H. Alexa. [place] Emergency  Emergency      triage- diarrhea      Nausea      Diarrhea      Emesis      Chest Pain      Diarrhea, unspecified      Chest pain, unspecified      Dependence on renal dialysis      End stage renal disease      Mar 05, 2017 [facility] Rosemarie Ax. [place] Emergency  Emergency     Mar 04, 2017 [place]. Rosemarie Ax. [place] Emergency  Emergency      Abdominal/Stomach Pain      Chest Pain      Abdominal Pain      Dependence on renal dialysis      Hyperkalemia      Generalized abdominal pain      End stage renal disease      Other chest pain      Mar 04, 2017 [facility] [place]. [place]  Emergency  Emergency     Mar 03, 2017 [facility] Rosemarie Ax. [place] Emergency  Emergency      Hypertensive chronic kidney disease with stage 5 chronic kidney disease or end stage renal disease      End stage renal disease      Chronic obstructive pulmonary disease, unspecified      Nicotine dependence, unspecified, uncomplicated      Diarrhea, unspecified      Mar 02, 2017 [facility] Emergency  Emergency     Mar 02, 2017 [place] [facility]. Rosemarie Ax. [place] Emergency  Emergency      Abdominal/Stomach Pain      Nausea      Diarrhea      Vomiting      Hyperkalemia      Chronic kidney disease, unspecified      Mar 01, 2017 [facility] Rosemarie Ax. [place] Emergency  Emergency     Feb 28, 2017 Mindi Curling. Staff. [place] Emergency  Emergency      Abdominal/Stomach Pain      Vomiting      Diarrhea      Chronic kidney disease, unspecified      Vomiting, unspecified      Diarrhea, unspecified      Nausea with vomiting, unspecified      Feb 28, 2017 [facility] Rosemarie Ax. [place] Emergency  Emergency     Feb 28, 2017 [place] - [facility] St[facility]. [place]. [place] Emergency  Emergency      Cough, Vomitting, and Diarhea      Chest Pain      Diarrhea      Abdominal Pain      Hypertensive urgency      Chest pain, unspecified      Diarrhea, unspecified      Feb 24, 2017 [facility] Rosemarie Ax.  Emergency  Emergency     Feb 24, 2017 [facility]  [place]an Hal[place] Emergency  Emergency     Feb 24, 2017 [facility] and Outpatient [facility] Rosemarie Ax. [place] Emergency  Emergency      Abdominal/Stomach Pain      Abdominal Pain      Enterocolitis due to Clostridium difficile, not specified as recurrent      Feb 23, 2017 [facility] [place]. [place] Emergency  Emergency  Chief Complaint: ABDOMINAL PAIN, VOMITING, DIARREA AND CHEST PAIN    Feb 23, 2017 [place] [facility]. Rosemarie Ax. [place] Emergency  Emergency      Abdominal/Stomach Pain      Abdominal Pain      Dependence  on renal dialysis      Lower abdominal pain, unspecified      End stage renal disease          Recent Inpatient Visit Summary  Showing 20 most recent visits out of 24 in the past 3 months  Admit Date Facility [facility] Type Major Type Diagnoses or Chief Complaint   Mar 07, 2017 [place] - [facility] Stakes H. [place]. [place] [facility] Surgical  Inpatient      Diarrhea, unspecified      Chest pain, unspecified      End stage renal disease      Dependence on renal dialysis      Mar 05, 2017 [facility] Rosemarie Ax. [place] Telemetry  Inpatient      Acute kidney failure, unspecified      Unspecified asthma, uncomplicated      Shortness of breath      Hypertensive chronic kidney disease with stage 5 chronic kidney disease or end stage renal disease      Patient's noncompliance with renal dialysis      Hypocalcemia      Anemia in chronic kidney disease      Noninfective gastroenteritis and colitis, unspecified      Allergy status to other drugs, medicaments and biological substances status      Thrombocytopenia, unspecified      Mar 04, 2017 [facility] Rosemarie Ax. [place] Inpatient  Inpatient      Nausea with vomiting, unspecified      Hypoglycemia, unspecified      Patient's noncompliance with renal dialysis      Dependence on renal dialysis      Diarrhea, unspecified      Nicotine dependence, unspecified, uncomplicated      End stage renal disease      Hypertensive chronic kidney disease with stage 5 chronic kidney disease or end stage renal disease      Hypocalcemia      Tobacco abuse counseling      Mar 02, 2017 [facility]. [place]  Medical Surgical  Inpatient      Dependence on renal dialysis      Chronic obstructive pulmonary disease, unspecified      Hypertensive urgency      Hypertensive chronic kidney disease with stage 5 chronic kidney disease or end stage renal disease      Nicotine dependence, unspecified, uncomplicated      Anemia in chronic kidney disease      End stage renal disease       Atherosclerotic heart disease of native coronary artery without angina pectoris      Enterocolitis due to Clostridium difficile, not specified as recurrent      Hyperkalemia      Mar 01, 2017 [facility]  Rosemarie Ax. 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 Alexa. [place] [facility] Surgical  Inpatient      Hypertensive urgency      Chest pain, unspecified      Diarrhea, unspecified      Feb 25, 2017 [facility] Rosemarie Ax. [place] Telemetry  Inpatient      Unspecified abdominal pain      Other specified postprocedural states      Allergy status to other drugs, medicaments and biological substances status      Personal history of urinary calculi      Essential (primary) hypertension      Hypertensive chronic kidney disease with stage 5 chronic kidney disease or end stage renal disease      Family history of ischemic heart disease and other diseases of the circulatory system      Nicotine dependence, cigarettes, in remission      Patient's other noncompliance with medication regimen      Atherosclerotic heart disease of native coronary artery without angina pectoris      Feb 24, 2017 [facility] [place]marie Ax. [place] Telemetry  Inpatient      Atherosclerotic heart disease of native coronary artery without angina pectoris      Chronic obstructive pulmonary disease, unspecified      Chest pain, unspecified      Dependence on renal dialysis      Allergy status to narcotic agent status      End stage renal disease      Procedure and treatment not carried out due to patient leaving prior to being seen by health care provider      Hypertensive  chronic kidney disease with stage 5 chronic kidney disease or end stage renal disease      Nausea      Unspecified abdominal pain      Feb 21, 2017 [facility] Rosemarie Ax. [place] Telemetry  Inpatient      Encounter for other general examination      Allergy status to analgesic agent status      Hypocalcemia      Unspecified asthma, uncomplicated      Chronic pain syndrome      Pulmonary hypertension, unspecified      Nicotine dependence, unspecified, uncomplicated      Hypoglycemia, unspecified      Other ascites      Other long term (current) drug therapy      Feb 18, 2017 [facility] [place] [facility]. [place].  Cardiology  Inpatient      End stage renal disease      Acidosis      Dependence on renal dialysis      Other forms of acute ischemic heart disease      Hyperkalemia      Hypertensive urgency      Lower abdominal pain, unspecified      Melena      Hypocalcemia  Anemia, unspecified      Feb 14, 2017 [facility] Rosemarie Ax. 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 [place]. 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 [place]. [place] Inpatient  Inpatient      HYPERKALEMIA ESRD      Dependence on renal dialysis      Chest pain, unspecified      Other ascites      Personal history of pneumonia (recurrent)      Hyperkalemia      Hypertensive chronic kidney disease with stage 5 chronic kidney disease or end stage renal disease      End stage renal disease      Type 2 diabetes mellitus with diabetic chronic kidney disease      Anemia, unspecified      Feb 08, 2017 [facility] [name] [facility]. [place]. [place]  Medical Surgical  Inpatient      Enterocolitis due to Clostridium difficile      Nausea with vomiting, unspecified      Patient's noncompliance with renal dialysis      Tobacco use      End stage renal disease      Procedure and treatment not carried out due to patient leaving prior to being seen by health care provider      Other ascites      Atherosclerotic heart disease of native coronary artery without angina pectoris      Chronic pain syndrome      Fluid overload, unspecified      Feb 07, 2017 [facility] [place] [place]. [place]. [place] [facility]  Inpatient      Chest pain, unspecified      End stage renal disease      Feb 07, 2017 [facility] [place] [place] [facility]  Inpatient      Allergy status to other drugs, medicaments and biological substances status      Anemia, unspecified      Dependence on renal dialysis      Allergy status to narcotic agent status      Patient's noncompliance with renal dialysis  Nicotine dependence, unspecified, uncomplicated      Unspecified asthma, uncomplicated      Hypertensive chronic kidney disease with stage 5 chronic kidney disease or end stage renal disease      Hypertensive urgency      Other chronic pain      Feb 06, 2017 [facility] [place] [place] [place] [facility]  Inpatient      Hypertensive urgency      Other long term (current) drug therapy      Presence of coronary angioplasty  implant and graft      Nicotine dependence, unspecified, uncomplicated      End stage renal disease      Acute ischemic heart disease, unspecified      Unspecified asthma, uncomplicated      Diarrhea, unspecified      Hypertensive chronic kidney disease with stage 5 chronic kidney disease or end stage renal disease      Presence of other vascular implants and grafts      Feb 04, 2017 [facility] [place]. [place] Medical Surgical  Inpatient      Unspecified abdominal pain      Chest pain, unspecified      Nausea with vomiting, unspecified      Diarrhea, unspecified      Other ascites      Dec 29, 2016 [facility] [place]  Inpatient      Chest Pain      Non-ST elevation (NSTEMI) myocardial infarction      End stage renal disease      Chest pain, unspecified      Dec 20, 2016 [facility]  Inpatient      chest pain, dialysis, chronic renal failure, anemia      Chest pain, unspecified      Anemia, unspecified            Prescription Monitoring Program  160??- Narcotic Use Score  080??- Sedative Use Score  000??- Stimulant Use Score  - All Scores range from 000-999 with 75% of the population scoring < 200 and on 1% scoring above 650  - The last digit of the narcotic, sedative, and stimulant score indicates the number of active prescriptions of that type  - Higher Use scores correlate with increased prescribers, pharmacies, mg equiv, and overlapping prescriptions   Concerning or unexpectedly high scores should prompt a review of the PMP record; this does not constitute checking PMP for prescribing purposes.    The above information is provided for the sole purpose of patient treatment. Use of this information beyond the terms of Data Sharing Memorandum of Understanding and License Agreement is prohibited. In certain cases not all visits may be represented. Consult the aforementioned facilities for additional information.   ? 2018 [employer] - [place], [place] - [email or web address]

## 2017-03-23 ENCOUNTER — Emergency Department
Admission: EM | Admit: 2017-03-23 | Discharge: 2017-03-23 | Discharge status: Against Medical Advice | Payer: Medicare Other | Attending: Emergency Medicine | Admitting: Emergency Medicine

## 2017-03-23 ENCOUNTER — Emergency Department: Payer: Medicare Other

## 2017-03-23 DIAGNOSIS — Z79899 Other long term (current) drug therapy: Secondary | ICD-10-CM | POA: Insufficient documentation

## 2017-03-23 DIAGNOSIS — R079 Chest pain, unspecified: Secondary | ICD-10-CM | POA: Insufficient documentation

## 2017-03-23 DIAGNOSIS — F17210 Nicotine dependence, cigarettes, uncomplicated: Secondary | ICD-10-CM | POA: Insufficient documentation

## 2017-03-23 DIAGNOSIS — E875 Hyperkalemia: Secondary | ICD-10-CM | POA: Insufficient documentation

## 2017-03-23 DIAGNOSIS — R1032 Left lower quadrant pain: Secondary | ICD-10-CM | POA: Insufficient documentation

## 2017-03-23 DIAGNOSIS — N186 End stage renal disease: Secondary | ICD-10-CM | POA: Insufficient documentation

## 2017-03-23 DIAGNOSIS — Z8619 Personal history of other infectious and parasitic diseases: Secondary | ICD-10-CM | POA: Insufficient documentation

## 2017-03-23 DIAGNOSIS — I120 Hypertensive chronic kidney disease with stage 5 chronic kidney disease or end stage renal disease: Secondary | ICD-10-CM | POA: Insufficient documentation

## 2017-03-23 DIAGNOSIS — I252 Old myocardial infarction: Secondary | ICD-10-CM | POA: Insufficient documentation

## 2017-03-23 DIAGNOSIS — R197 Diarrhea, unspecified: Secondary | ICD-10-CM | POA: Insufficient documentation

## 2017-03-23 DIAGNOSIS — Z992 Dependence on renal dialysis: Secondary | ICD-10-CM | POA: Insufficient documentation

## 2017-03-23 DIAGNOSIS — Z955 Presence of coronary angioplasty implant and graft: Secondary | ICD-10-CM | POA: Insufficient documentation

## 2017-03-23 DIAGNOSIS — R109 Unspecified abdominal pain: Secondary | ICD-10-CM

## 2017-03-23 DIAGNOSIS — J45909 Unspecified asthma, uncomplicated: Secondary | ICD-10-CM | POA: Insufficient documentation

## 2017-03-23 LAB — ECG 12-LEAD
Atrial Rate: 99 {beats}/min
P Axis: 53 degrees
P-R Interval: 182 ms
Q-T Interval: 400 ms
QRS Duration: 78 ms
QTC Calculation (Bezet): 513 ms
R Axis: -8 degrees
T Axis: 69 degrees
Ventricular Rate: 99 {beats}/min

## 2017-03-23 LAB — CBC AND DIFFERENTIAL
Absolute NRBC: 0 10*3/uL
Basophils Absolute Automated: 0.02 10*3/uL (ref 0.00–0.20)
Basophils Automated: 0.3 %
Eosinophils Absolute Automated: 0.45 10*3/uL (ref 0.00–0.70)
Eosinophils Automated: 7.2 %
Hematocrit: 25.3 % — ABNORMAL LOW (ref 42.0–52.0)
Hgb: 7.9 g/dL — ABNORMAL LOW (ref 13.0–17.0)
Immature Granulocytes Absolute: 0.03 10*3/uL
Immature Granulocytes: 0.5 %
Lymphocytes Absolute Automated: 0.45 10*3/uL — ABNORMAL LOW (ref 0.50–4.40)
Lymphocytes Automated: 7.2 %
MCH: 29.4 pg (ref 28.0–32.0)
MCHC: 31.2 g/dL — ABNORMAL LOW (ref 32.0–36.0)
MCV: 94.1 fL (ref 80.0–100.0)
MPV: 9.7 fL (ref 9.4–12.3)
Monocytes Absolute Automated: 0.52 10*3/uL (ref 0.00–1.20)
Monocytes: 8.3 %
Neutrophils Absolute: 4.8 10*3/uL (ref 1.80–8.10)
Neutrophils: 76.5 %
Nucleated RBC: 0 /100 WBC (ref 0.0–1.0)
Platelets: 129 10*3/uL — ABNORMAL LOW (ref 140–400)
RBC: 2.69 10*6/uL — ABNORMAL LOW (ref 4.70–6.00)
RDW: 17 % — ABNORMAL HIGH (ref 12–15)
WBC: 6.27 10*3/uL (ref 3.50–10.80)

## 2017-03-23 LAB — BASIC METABOLIC PANEL
Anion Gap: 20 — ABNORMAL HIGH (ref 5.0–15.0)
BUN: 111 mg/dL — ABNORMAL HIGH (ref 9–28)
CO2: 15 mEq/L — ABNORMAL LOW (ref 22–29)
Calcium: 6 mg/dL — ABNORMAL LOW (ref 8.5–10.5)
Chloride: 104 mEq/L (ref 100–111)
Creatinine: 14.8 mg/dL — ABNORMAL HIGH (ref 0.7–1.3)
Glucose: 116 mg/dL — ABNORMAL HIGH (ref 70–100)
Potassium: 6 mEq/L — ABNORMAL HIGH (ref 3.5–5.1)
Sodium: 139 mEq/L (ref 136–145)

## 2017-03-23 LAB — TROPONIN I: Troponin I: 0.09 ng/mL (ref 0.00–0.09)

## 2017-03-23 LAB — GFR: EGFR: 4.3

## 2017-03-23 MED ORDER — ACETAMINOPHEN 500 MG PO TABS
1000.00 mg | ORAL_TABLET | Freq: Once | ORAL | Status: DC
Start: 2017-03-23 — End: 2017-03-23
  Filled 2017-03-23: qty 2

## 2017-03-23 MED ORDER — LABETALOL HCL 5 MG/ML IV SOLN (WRAP)
20.00 mg | Freq: Once | INTRAVENOUS | Status: DC
Start: 2017-03-23 — End: 2017-03-23
  Filled 2017-03-23: qty 20

## 2017-03-23 MED ORDER — IOHEXOL 240 MG/ML IJ SOLN
50.00 mL | Freq: Once | INTRAMUSCULAR | Status: DC
Start: 2017-03-23 — End: 2017-03-23

## 2017-03-23 NOTE — Discharge Instructions (Signed)
Dear [name]:    I appreciate your choosing the [facility] for your healthcare needs, and hope your visit today was EXCELLENT.    Instructions:  Please follow-up with your primary care physician this week.     Return to the Emergency Department for any worsening symptoms or concerns.    Below is some information that our patients often find helpful.    We wish you good health and please do not hesitate to contact us if we can ever be of any assistance.    Sincerely,  Forest Gleason, MD  [facility] of Emergency Medicine    ________________________________________________________________    If you do not continue to improve or your condition worsens, please contact your doctor or return immediately to the Emergency Department.    Thank you for choosing [facility] for your emergency care needs.  We strive to provide EXCELLENT care to you and your family.      DOCTOR REFERRALS  Call [phone] if you need any further referrals and we can help you find a primary care doctor or specialist.  Also, available online at:  [email or web address]    YOUR CONTACT INFORMATION  Before leaving please check with registration to make sure we have an up-to-date contact number.  You can call registration at [phone] to update your information.  For questions about your hospital bill, please call [phone].  For questions about your Emergency Dept Physician bill please call [phone].      FREE HEALTH SERVICES  If you need help with health or social services, please call 2-1-1 for a free referral to resources in your area.  2-1-1 is a free service connecting people with information on health insurance, free clinics, pregnancy, mental health, dental care, food assistance, housing, and substance abuse counseling.  Also, available online at:  http://www.211[place].org    MEDICAL RECORDS AND TESTS  Certain laboratory test results do not come back the  same day, for example urine cultures.   We will contact you if other important findings are noted.  Radiology films are often reviewed again to ensure accuracy.  If there is any discrepancy, we will notify you.      Please call [phone] to pick up a complimentary CD of any radiology studies performed.  If you or your doctor would like to request a copy of your medical records, please call [phone].      ORTHOPEDIC INJURY   Please know that significant injuries can exist even when an initial x-ray is read as normal or negative.  This can occur because some fractures (broken bones) are not initially visible on x-rays.  For this reason, close outpatient follow-up with your primary care doctor or bone specialist (orthopedist) is required.    MEDICATIONS AND FOLLOWUP  Please be aware that some prescription medications can cause drowsiness.  Use caution when driving or operating machinery.    The examination and treatment you have received in our Emergency Department is provided on an emergency basis, and is not intended to be a substitute for your primary care physician.  It is important that your doctor checks you again and that you report any new or remaining problems at that time.      24 HOUR PHARMACIES  [facility] [address], [place], [place] [address] (1.4 miles, 7 minutes)  [facility] - [address] [place], [place] [address] (6.5 miles, 13 minutes)  Handout with directions  available on request

## 2017-03-23 NOTE — ED Triage Notes (Signed)
Pt c/o CP since 0300 tonight. +SOB. Pt wears 3-4L O2 daily. Pt also c/o abd pain and diarrhea x 3 days. hx of CDIFF. Pt reports "I think my CDIFF is coming back" Pt gets dialysis MWF; last appt was friday.

## 2017-03-23 NOTE — ED Provider Notes (Signed)
Physician/Midlevel provider first contact with patient: 03/23/17 1610         [facility] EMERGENCY DEPARTMENT HISTORY AND PHYSICAL EXAM    Patient Name: [name], [name]  Encounter Date:  03/23/2017  Rendering Provider: Venancio Poisson, MD  Patient DOB:  12/01/1969  MRN:  [id number]    History of Presenting Illness     Historian: Patient    46 y.o. male with h/o HTN, MI, c diff and ESRD w/ hemodialysis on M/W/F presents for an evaluation of constant LLQ pain, starting a couple days ago. Associated w/ N/V/D. Pt states "c diff is back." Pt reports his last dialysis was 5 days ago and missed it 2 days ago d/t N/V/D. Of note, pt was evaluated at different hospitals, including [facility] and [place], for these sxs over last month.     PMD:  Pcp, Noneorunknown, MD   Nephrology: [name]    Past Medical History     Past Medical History:   Diagnosis Date   . Asthma    . Asthma without status asthmaticus    . 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 Clostridium difficile diarrhea    . Clostridium difficile infection    . Drug-seeking behavior    . ESRD (end stage renal disease) on dialysis     HD since 2011   . Hypertension    . Kidney failure    . Myocardial infarction     2002   . Myocardial infarction 2010    2 stents       Past Surgical History     Past Surgical History:   Procedure Laterality Date   . AV FISTULA PLACEMENT     . HERNIA REPAIR     . HERNIA REPAIR  2016   . PERITONEAL CATHETER INSERTION         Family History     Family History   Problem Relation Age of Onset   . Hypertension Mother        Social History     Social History     Social History   . Marital status: Single     Spouse name: N/A   . Number of children: N/A   . Years of education: N/A     Social History Main Topics   . Smoking status: Current Every Day Smoker     Packs/day: 0.25     Years: 20.00     Types: Cigarettes   . Smokeless tobacco: Never Used      Comment: declines   . Alcohol use No   . Drug use: No   .  Sexual activity: Not on file     Other Topics Concern   . Not on file     Social History Narrative    ** Merged History Encounter **            Home Medications     Home medications reviewed by ED MD     Discharge Medication List as of 03/23/2017  7:27 AM      CONTINUE these medications which have NOT CHANGED    Details   acetaminophen (TYLENOL) 325 MG tablet Take 2 tablets (650 mg total) by mouth every 4 (four) hours as needed for Pain., Starting Thu 01/08/2016, No Print      !! albuterol (PROVENTIL HFA;VENTOLIN HFA) 108 (90 Base) MCG/ACT inhaler Inhale 2 puffs into the lungs every 4 (four) hours as needed for Wheezing or Shortness of  Breath., Historical Med      !! albuterol (PROVENTIL HFA;VENTOLIN HFA) 108 (90 Base) MCG/ACT inhaler Inhale into the lungs., Historical Med      !! albuterol (PROVENTIL HFA;VENTOLIN HFA) 108 (90 Base) MCG/ACT inhaler Take 180 mcg inhaled by mouth Every 4 Hours As Needed for Wheezing or Shortness of Breath., Historical Med      !! albuterol (PROVENTIL) (2.5 MG/3ML) 0.083% nebulizer solution Take 2.5 mg by nebulization 2 (two) times daily as needed for Wheezing or Shortness of Breath.    , Historical Med      !! albuterol (PROVENTIL) (2.5 MG/3ML) 0.083% nebulizer solution Take 2.5 mg by nebulization daily, Historical Med      albuterol-ipratropium (COMBIVENT RESPIMAT) 20-100 MCG/ACT Aero Soln Inhale 2 puffs into the lungs every evening.    , Historical Med      !! amLODIPine (NORVASC) 10 MG tablet Take 10 mg by mouth daily.    , Historical Med      !! amLODIPine (NORVASC) 10 MG tablet Take 10 mg by mouth daily., Historical Med      !! carvedilol (COREG) 12.5 MG tablet Take 12.5 mg by mouth 2 (two) times daily with meals.    , Historical Med      !! carvedilol (COREG) 25 MG tablet Take 25 mg by mouth 2 (two) times daily., Historical Med      darbepoetin alfa (ARANESP) 60 MCG/0.3ML injection Inject 0.3 mLs (60 mcg total) into the skin once a week., Starting Fri 02/25/2017, Until Sun  03/27/2017, Normal      !! furosemide (LASIX) 80 MG tablet Take 80 mg by mouth daily., Historical Med      !! furosemide (LASIX) 80 MG tablet Take 80 mg by mouth daily., Historical Med      !! hydrALAZINE (APRESOLINE) 100 MG tablet Take 100 mg by mouth 3 (three) times daily., Historical Med      !! hydrALAZINE (APRESOLINE) 100 MG tablet Take 100 mg by mouth 3 (three) times daily., Historical Med      pantoprazole (PROTONIX) 40 MG tablet Take 40 mg by mouth daily., Historical Med      sevelamer (RENAGEL) 400 MG tablet Take by mouth., Historical Med      sevelamer (RENVELA) 800 MG tablet Take 800 mg by mouth 3 (three) times daily with meals., Historical Med      sodium chloride 0.9 % SOLN 40 mL with albuterol (5 MG/ML) 0.5% NEBU 2.5 mg/mL Inhale into the lungs as needed., Historical Med       !! - Potential duplicate medications found. Please discuss with provider.          Review of Systems     Constitutional:  No fever  Eyes: No discharge   ENT: No ST  CV:  No CP   Resp:  No SOB or cough  GI: +LLQ pain, +N/V/D  GU: No dysuria  MS:    Skin: No rash  Neuro:  No HA  Psych:  No behavior changes  All other systems reviewed and negative    Physical Exam     BP (!) 203/115   Pulse 91   Temp 97.7 F (36.5 C) (Oral)   Resp 14   Ht 6\' 1"  (1.854 m)   Wt 111.1 kg   SpO2 100%   BMI 32.32 kg/m     CONSTITUTIONAL: Vital signs reviewed, Alert and oriented X 3.  HEAD: Atraumatic, Normocephalic.  EYES: Eyes are normal to inspection, Sclera are normal, Conjunctiva  are normal.  ENT: Ears normal to inspection, Nose examination normal.  NECK: Normal ROM.  RESPIRATORY CHEST: L anterior chest w/ perma-cath, Breath sounds normal, No respiratory distress.  CARDIOVASCULAR: RRR, No murmurs, Normal S1 S2.  ABDOMEN: LLQ pain, Abdomen is distended and ascitic.  UPPER EXTREMITY: Inspection normal, Normal range of motion.  NEURO: GCS Is 15, No focal motor deficits.  SKIN: Skin is warm, Skin Is dry.  PSYCHIATRIC: Oriented X 3, Normal  affect.    ED Medications Administered     ED Medication Orders     Start Ordered     Status Ordering Provider    03/23/17 [phone] 03/23/17 [phone]    Once     Route: Oral  Ordered Dose: 1,000 mg     Discontinued Venancio Poisson    03/23/17 5409 03/23/17 8119    Once     Route: Intravenous  Ordered Dose: 20 mg     Discontinued Venancio Poisson    03/23/17 1478 03/23/17 2956    Once     Route: Oral  Ordered Dose: 50 mL     Discontinued Ander Purpura R          Orders Placed During This Encounter     Orders Placed This Encounter   Procedures   . Stool for C. diff   . Stool for Salm/Shig/Campy/Shiga PCR   . Ova and parasite screen   . Chest AP Portable   . Basic Metabolic Panel   . CBC and differential   . Troponin I   . GFR   . Cardiac monitoring (ED ONLY)   . ECG 12 lead   . EKG SCAN   . Saline lock IV         Diagnostic Study Results     Labs  Results     Procedure Component Value Units Date/Time    Basic Metabolic Panel [[id number]]  (Abnormal) Collected:  03/23/17 0611    Specimen:  Blood Updated:  03/23/17 0646     Glucose 116 (H) mg/dL      BUN 469 (H) mg/dL      Creatinine 62.9 (H) mg/dL      Calcium 6.0 (L) mg/dL      Sodium 528 mEq/L      Potassium 6.0 (H) mEq/L      Chloride 104 mEq/L      CO2 15 (L) mEq/L      Anion Gap 20.0 (H)    GFR [[id number]] Collected:  03/23/17 0611     Updated:  03/23/17 0646     EGFR 4.3    Troponin I [[id number]] Collected:  03/23/17 0611    Specimen:  Blood Updated:  03/23/17 0634     Troponin I 0.09 ng/mL     CBC and differential [[id number]]  (Abnormal) Collected:  03/23/17 0611    Specimen:  Blood from Blood Updated:  03/23/17 0615     WBC 6.27 x10 3/uL      Hgb 7.9 (L) g/dL      Hematocrit 63.8 (L) %      Platelets 129 (L) x10 3/uL      RBC 2.69 (L) x10 6/uL      MCV 94.1 fL      MCH 29.4 pg      MCHC 31.2 (L) g/dL      RDW 17 (H) %      MPV 9.7 fL      Neutrophils 76.5 %      Lymphocytes  Automated 7.2 %      Monocytes 8.3 %      Eosinophils Automated 7.2 %      Basophils Automated 0.3 %       Immature Granulocyte 0.5 %      Nucleated RBC 0.0 /100 WBC      Neutrophils Absolute 4.80 x10 3/uL      Abs Lymph Automated 0.45 (L) x10 3/uL      Abs Mono Automated 0.52 x10 3/uL      Abs Eos Automated 0.45 x10 3/uL      Absolute Baso Automated 0.02 x10 3/uL      Absolute Immature Granulocyte 0.03 x10 3/uL      Absolute NRBC 0.00 x10 3/uL         Labs Reviewed   BASIC METABOLIC PANEL - Abnormal; Notable for the following:        Result Value    Glucose 116 (*)     BUN 111 (*)     Creatinine 14.8 (*)     Calcium 6.0 (*)     Potassium 6.0 (*)     CO2 15 (*)     Anion Gap 20.0 (*)     All other components within normal limits   CBC AND DIFFERENTIAL - Abnormal; Notable for the following:     Hgb 7.9 (*)     Hematocrit 25.3 (*)     Platelets 129 (*)     RBC 2.69 (*)     MCHC 31.2 (*)     RDW 17 (*)     Abs Lymph Automated 0.45 (*)     All other components within normal limits   CLOSTRIDIUM DIFFICILE TOXIN B PCR   STOOL FOR SALMONELLA,SHIGELLA,CAMPYLOBACTER AND SHIGA TOXIN PCR   STOOL OVA & PARASITE SCREEN CRYPTOSPORIDIUM/GIARDIA AG   TROPONIN I   GFR         Radiologic Studies  Radiology Results (24 Hour)     Procedure Component Value Units Date/Time    Chest AP Portable [[id number]] Collected:  03/23/17 4098    Order Status:  Completed Updated:  03/23/17 0643    Narrative:       HISTORY: Shortness of breath    Portable image of the chest shows left dialysis catheter extending to  the right atrium. Vascular stent is present on the right. Cardiac  pericardial silhouette is enlarged. Vascular pattern is mildly  increased. There is no focal consolidation, pleural effusion or  pneumothorax.      Impression:         1. Increased vascular pattern. There is no significant change when  compared to 03/07/2017.    Olen Pel, MD   03/23/2017 6:39 AM            Monitors, EKG     EKG (interpreted by ED physician): NSR, rate of 100 bpm, no ST-T wave changes    Cardiac Monitor (interpreted by ED physician):     Injury Mechanism                Critical Care     None     Clinical Course / MDM     Working Differential (not completely inclusive):   Shamar Engelmann is a 46 y.o. male with h/o end stage renal disease and multiple ED visits who presents with vomiting and abd pain c/w his pervious c diff. Will order blood work and CT scan. Advised pt that we will not use narcotics. Pt requested tylenol.  Notes:     Discussion of abnormal results/incidental findings:         Data Review     Nursing records reviewed and agree: Yes    Laboratory results reviewed by ED provider (yes/no): Yes    Radiologic study results reviewed by ED provider (yes/no): Yes    I, Venancio Poisson, MD, personally performed the services documented. Charlestine Night Alvino Chapel is scribing for me on [facility]. I reviewed and confirm the accuracy of the information in this medical record.    I, Martie Round, am serving as a Neurosurgeon to document services personally performed by Venancio Poisson, MD, based on the provider's statements to me.     Credentials: Martie Round, scribe    Rendering Provider: Venancio Poisson, MD    Diagnosis and Disposition     Clinical Impression  1. Chest pain, unspecified type    2. Abdominal pain, unspecified abdominal location    3. Diarrhea, unspecified type    4. Hyperkalemia        Disposition  ED Disposition     ED Disposition Condition Date/Time Comment    AMA  Wed Mar 23, 2017  7:27 AM Date: 03/23/2017  Patient: Haddon Fyfe  Admitted: 03/23/2017  6:07 AM  Attending Provider: Forest Gleason, MD    Izetta Dakin or his authorized caregiver has made the decision for the patient to leave the emergency department against the a dvice of his attending physician. He or his authorized caregiver has been informed and understands the inherent risks, including death.  He or his authorized caregiver has decided to accept the responsibility for this decision. Izetta Dakin and al l necessary parties have been advised that he may return for further  evaluation or treatment. His condition at time of discharge was stable.  Izetta Dakin had current vital signs as follows:  BP (!) 223/119   Pulse 86   Temp 97.8 F (36.6 C) (O ral)   Resp 20   Ht 6\' 1"  (1.854 m)   Wt 111.1 kg             Prescriptions       Discharge Medication List as of 03/23/2017  7:27 AM                       Venancio Poisson, MD  03/23/17 2300

## 2017-03-23 NOTE — ED Provider Notes (Signed)
7:28 AM Pt expressed to nursing desire to leave AMA. General visit pattern does show that when pt does not receive narcotic pain medication, he leaves AMA. Potassium of 6 on lab work is not much more elevated than previous. Asked pt if he planned to go to dialysis today, he stated he would. I emphasized that it is a good idea.                Jorge Gleason, MD  03/23/17 [phone]

## 2017-03-23 NOTE — EDIE (Signed)
[name]?NOTIFICATION?03/23/2017 05:55?Orland, Visconti D?MRN: [id number]    This patient has registered at the [facility] Emergency Department   For more information visit: [email or web address]   Criteria met      5 ED Visits in 12 Months    3 Different Facilities in 90 Days    Security Events  No recent Security Events currently on file    ED Care Guidelines  There are currently no ED Care Guidelines in [place] for this patient. Please check your facility's medical records system.    Care Providers  Provider [facility] Type Phone Fax Service Dates   MATTHEW [facility]   Current      E.D. Visit Count (12 mo.)  Facility Visits   [facility] 7   [facility] 2   [place] [facility] 1   [facility] 1   [facility] 1   [facility] 2   [facility] 1   [facility] 6   [facility] 2   [facility] 24   [facility] 1   [facility] 4   [facility] 1   [facility] 26   [facility] 2   [facility] 1   [facility] 2   [facility] 5   J.WEliezer Champagne 2   [place] [facility] 2   [facility] 3   [facility] 1   [facility] 3   [facility] 2   [facility] [place] 2   [facility] 5   [facility] 4   [facility] 2   [facility] 2   [place] [facility] 1   [facility] 1   [facility] 1   [facility] 1   [facility] 2   [facility] [address] [facility] 4   [facility] 2   [facility] 3   [facility] 1   [facility] 1   [facility] 1   [facility] [facility] 1   [facility] 1   [facility] 16   [facility] 2   [facility] 2   [facility] 11   [facility] 1   [facility] 1   [facility] 1   [facility] 1   [facility] 1   [facility] 1   [facility] 1   Total 182   Note: Visits indicate total known visits.      Recent Emergency Department Visit Summary  Showing 20 most recent visits out of 62 in the past 3 months  Admit Date Facility [facility] Type Major Type Diagnoses or Chief Complaint   Mar 23, 2017 [facility] [place] [facility]. [place]. [place] Emergency  Emergency  Chest pain, diarrhea, emesis      Mar 23, 2017 [facility] Manas. [place] Emergency  Emergency      chest pain, diarrhea, vomitting      Shortness of Breath      Chest Pain      Diarrhea      Abdominal Pain      Mar 23, 2017 [facility] [place]. [place] Emergency  Emergency     Mar 22, 2017 [facility]. Man[place]. [place] Emergency  Emergency      Abdominal Pain      Shortness of Breath      chest pain, diarrhea, vomitting      Chest Pain      Diarrhea      Hyperkalemia      Mar 21, 2017 [facility]. Rosemarie Ax. [place] Emergency  Emergency     Mar 20, 2017 [facility] [place]as. [place] Emergency  Emergency     Mar 20, 2017 [facility]. [place]. [place] Emergency  Emergency     Mar 20, 2017 [facility]. Man[place]. [place] Emergency  Emergency      Chest Pain Diarrhea Vomiting      Abdominal Pain       Diarrhea      Chest Pain      Vomiting      Unspecified abdominal pain      Chest pain, unspecified      Other ascites      Anemia in chronic kidney disease      Anemia, unspecified      Mar 19, 2017 [facility] [place] Emergency  Emergency     Mar 18, 2017 [place] [facility] Rosemarie Ax. [place] Emergency  Emergency      Abdominal/Stomach Pain      Chest Pain      Abdominal Pain      Acute pulmonary edema      Chronic kidney disease, unspecified      Chest pain, unspecified      Hypocalcemia      Unspecified abdominal pain      Mar 17, 2017 [facility] Rosemarie Ax. [place] Emergency  Emergency      Body mass index (BMI) 32.0-32.9, adult      Dependence on renal dialysis      Nicotine dependence, unspecified, uncomplicated      Unspecified asthma, uncomplicated      Obesity, unspecified      End stage renal disease      Hypertensive chronic kidney disease with stage 5 chronic kidney disease or end stage renal disease      Left lower quadrant pain      Mar 12, 2017 [facility] [place]. [place] Emergency  Emergency     Mar 12, 2017 [place] [facility] [place]. [place] Emergency  Emergency      Diarrhea, Chest Pain      Chest Pain      Dependence on renal dialysis      End stage renal disease      Chest pain, unspecified      Hyperkalemia      Mar 12, 2017 [place] [facility] [place] [facility]. [place]. [place] Emergency  Emergency      Triage      Triage:abd.pain, diarrhea      Abdominal Pain  Diarrhea      Mar 07, 2017 [place] [facility] [place]. [place] Emergency  Emergency     Mar 07, 2017 [place] [facility] [place] Arlin. [place] Emergency  Emergency     Mar 07, 2017 [place] [facility] [place]. [place] Emergency  Emergency      Chest Pain      Hypocalcemia      Mar 07, 2017 [place] - Shea Stakes H. Alexa. [place] Emergency  Emergency      triage- diarrhea      Nausea      Diarrhea      Emesis      Chest Pain      Diarrhea, unspecified      Chest pain, unspecified      Dependence on renal dialysis      End stage  renal disease      Mar 05, 2017 [facility] Rosemarie Ax. [place] Emergency  Emergency     Mar 04, 2017 [place] [facility]. Rosemarie Ax. [place] Emergency  Emergency      Abdominal/Stomach Pain      Chest Pain      Abdominal Pain      Dependence on renal dialysis      Hyperkalemia      Generalized abdominal pain      End stage renal disease      Other chest pain          Recent Inpatient Visit Summary  Showing 20 most recent visits out of 23 in the past 3 months  Admit Date Facility [facility] Type Major Type Diagnoses or Chief Complaint   Mar 21, 2017 [facility] Rosemarie Ax. [place] Inpatient  Inpatient      Hypokalemia      Diarrhea, unspecified      Mar 20, 2017 [facility] Manas. [place] [facility]  Inpatient      Other ascites      Unspecified abdominal pain      Anemia, unspecified      Fever, unspecified      Hypocalcemia      Chest pain, unspecified      Chest Pain      Hyperkalemia      Abdominal Pain      Diarrhea      Mar 19, 2017 [facility] Rosemarie Ax. [place] [facility]  Inpatient     Mar 18, 2017 [place] [facility]. [place]marie Ax. [place] [facility]  Inpatient      Chest pain, unspecified      Unspecified abdominal pain      Hypocalcemia      Acute pulmonary edema      Chronic kidney disease, unspecified      Abnormal levels of other serum enzymes      Mar 07, 2017 [place] [facility] [place] [facility]. Alexa. [place] [facility]  Inpatient      Diarrhea, unspecified      Chest pain, unspecified      End stage renal disease      Dependence on renal dialysis      Mar 05, 2017 [facility] Rosemarie Ax. [place] Telemetry  Inpatient      Acute kidney failure, unspecified      Unspecified asthma, uncomplicated      Shortness of breath      Hypertensive chronic kidney disease with stage 5 chronic kidney disease or end stage renal disease  Patient's noncompliance with renal dialysis      Hypocalcemia      Anemia in chronic kidney disease      Noninfective gastroenteritis and colitis,  unspecified      Allergy status to other drugs, medicaments and biological substances status      Thrombocytopenia, unspecified      Mar 04, 2017 [facility] Rosemarie Ax. [place] Inpatient  Inpatient      Nausea with vomiting, unspecified      Hypoglycemia, unspecified      Patient's noncompliance with renal dialysis      Dependence on renal dialysis      Diarrhea, unspecified      Nicotine dependence, unspecified, uncomplicated      End stage renal disease      Hypertensive chronic kidney disease with stage 5 chronic kidney disease or end stage renal disease      Hypocalcemia      Tobacco abuse counseling      Mar 02, 2017 [facility]. [place]. 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 Rosemarie Ax. 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 Alexa. [place] [facility]  Inpatient      Hypertensive urgency      Chest pain, unspecified      Diarrhea, unspecified      Feb 25, 2017 [facility]. Rosemarie Ax. [place] Telemetry  Inpatient      Unspecified abdominal pain      Other specified postprocedural  states      Allergy status to other drugs, medicaments and biological substances status      Personal history of urinary calculi      Essential (primary) hypertension      Hypertensive chronic kidney disease with stage 5 chronic kidney disease or end stage renal disease      Family history of ischemic heart disease and other diseases of the circulatory system      Nicotine dependence, cigarettes, in remission      Patient's other noncompliance with medication regimen      Atherosclerotic heart disease of native coronary artery without angina pectoris      Feb 24, 2017 [facility] Rosemarie Ax. [place] Telemetry  Inpatient  Atherosclerotic heart disease of native coronary artery without angina pectoris      Chronic obstructive pulmonary disease, unspecified      Chest pain, unspecified      Dependence on renal dialysis      Allergy status to narcotic agent status      End stage renal disease      Procedure and treatment not carried out due to patient leaving prior to being seen by health care provider      Hypertensive chronic kidney disease with stage 5 chronic kidney disease or end stage renal disease      Nausea      Unspecified abdominal pain      Feb 21, 2017 [facility] Rosemarie Ax. [place] Telemetry  Inpatient      Encounter for other general examination      Allergy status to analgesic agent status      Hypocalcemia      Unspecified asthma, uncomplicated      Chronic pain syndrome      Pulmonary hypertension, unspecified      Nicotine dependence, unspecified, uncomplicated      Hypoglycemia, unspecified      Other ascites      Other long term (current) drug therapy      Feb 18, 2017 [place] [facility] [place] [facility] [place]. [facility]  Inpatient      End stage renal disease      Acidosis      Dependence on renal dialysis      Other forms of acute ischemic heart disease      Hyperkalemia      Hypertensive urgency      Lower abdominal pain, unspecified      Melena      Hypocalcemia      Anemia,  unspecified      Feb 14, 2017 [facility] Rosemarie Ax. [place] Telemetry  Inpatient      Diarrhea, unspecified      Chest pain, unspecified      Tobacco abuse counseling      Presence of coronary angioplasty implant and graft      Body mass index (BMI) 30.0-30.9, adult      Hypocalcemia      Personal history of other diseases of the nervous system and sense organs      Personal history of other infectious and parasitic diseases      Other chest pain      Hypokalemia      Feb 11, 2017 [facility]. [place]. [facility]  Inpatient      Acute pulmonary edema      Hypertensive chronic kidney disease with stage 5 chronic kidney disease or end stage renal disease      End stage renal disease      Fluid overload, unspecified      Hyperlipidemia, unspecified      Presence of coronary angioplasty implant and graft      Allergy status to other drugs, medicaments and biological substances status      Type 2 diabetes mellitus with diabetic chronic kidney disease      Nicotine dependence, cigarettes, uncomplicated      Dependence on renal dialysis      Feb 10, 2017 [name] [place]. [place] Inpatient  Inpatient      HYPERKALEMIA ESRD      Dependence on renal dialysis      Chest pain, unspecified  Other ascites      Personal history of pneumonia (recurrent)      Hyperkalemia      Hypertensive chronic kidney disease with stage 5 chronic kidney disease or end stage renal disease      End stage renal disease      Type 2 diabetes mellitus with diabetic chronic kidney disease      Anemia, unspecified      Feb 08, 2017 [facility] [name] [facility] [place]. [place] [facility]  Inpatient      Enterocolitis due to Clostridium difficile      Nausea with vomiting, unspecified      Patient's noncompliance with renal dialysis      Tobacco use      End stage renal disease      Procedure and treatment not carried out due to patient leaving prior to being seen by health care provider      Other  ascites      Atherosclerotic heart disease of native coronary artery without angina pectoris      Chronic pain syndrome      Fluid overload, unspecified      Feb 07, 2017 [facility] [place] [employer]. [place]. [place] [facility]  Inpatient      Chest pain, unspecified      End stage renal disease      Feb 07, 2017 [facility]. [place]. [place] [facility]  Inpatient      Allergy status to other drugs, medicaments and biological substances status      Anemia, unspecified      Dependence on renal dialysis      Allergy status to narcotic agent status      Patient's noncompliance with renal dialysis      Nicotine dependence, unspecified, uncomplicated      Unspecified asthma, uncomplicated      Hypertensive chronic kidney disease with stage 5 chronic kidney disease or end stage renal disease      Hypertensive urgency      Other chronic pain            PDMP Report  Unable to determine if a PDMP report exists, because an error response was received from PDMP.    The above information is provided for the sole purpose of patient treatment. Use of this information beyond the terms of Data Sharing Memorandum of Understanding and License Agreement is prohibited. In certain cases not all visits may be represented. Consult the aforementioned facilities for additional information.   ? 2018 [employer] - [place], [place] - [email or web address]

## 2017-03-25 NOTE — ED Provider Notes (Signed)
Pt eloped prior to my seeing him and doing an evaluation, therefore I did not participate in the care of this patient.         Susie Cassette, MD  03/25/17 Ernestina [place]

## 2017-04-08 ENCOUNTER — Emergency Department: Payer: MEDICARE

## 2017-04-08 ENCOUNTER — Inpatient Hospital Stay
Admit: 2017-04-08 | Discharge: 2017-04-08 | Discharge status: Against Medical Advice | Payer: MEDICARE | Attending: Emergency Medicine

## 2017-04-08 DIAGNOSIS — R079 Chest pain, unspecified: Secondary | ICD-10-CM

## 2017-04-08 LAB — EKG, 12 LEAD, INITIAL
Atrial Rate: 92 {beats}/min
Calculated P Axis: 54 degrees
Calculated R Axis: -7 degrees
Calculated T Axis: 80 degrees
Diagnosis: NORMAL
P-R Interval: 200 ms
Q-T Interval: 406 ms
QRS Duration: 82 ms
QTC Calculation (Bezet): 502 ms
Ventricular Rate: 92 {beats}/min

## 2017-04-08 LAB — EKG 12-LEAD
Atrial Rate: 92 {beats}/min
Diagnosis: NORMAL
P Axis: 54 degrees
P-R Interval: 200 ms
Q-T Interval: 406 ms
QRS Duration: 82 ms
QTc Calculation (Bazett): 502 ms
R Axis: -7 degrees
T Axis: 80 degrees
Ventricular Rate: 92 {beats}/min

## 2017-04-08 NOTE — ED Provider Notes (Signed)
46 y.o. male with past medical history significant for CKD (on dialysis) and HTN presents ambulatory to ED with chief complaint of chest pain and shortness of breath that started this morning, noting that it feels as if he "just ran a marathon." Pt reports associated nausea, vomiting, diarrhea, and abd pain that radiates to his right lower back, all of which started 5 days ago. Pt states that he was last dialyzed 5 days ago in [place], but is normally dialyzed every Monday, Wednesday, and Friday. Pt states that he is here visiting his sister, but lives in [place] and is attempting to get his provider switched to here. Of note, pt is on 3-4 L oxygen PRN. Pt also had a recent dx of C. diff. Pt denies any other sx currently. There are no other acute medical concerns at this time.    Social hx: Patient reports daily Tobacco use. Denies EtOH use. Denies illicit drug abuse.  PCP: Jeanell Sparrow, MD    Note written by Blanchie Dessert, Scribe, as dictated by Loni Beckwith, MD 1:16 AM          The history is provided by the patient. No language interpreter was used.        Past Medical History:   Diagnosis Date   ??? Chronic kidney disease    ??? Hypertension    ??? Infectious disease        No past surgical history on file.      No family history on file.    Social History     Socioeconomic History   ??? Marital status: SINGLE     Spouse name: Not on file   ??? Number of children: Not on file   ??? Years of education: Not on file   ??? Highest education level: Not on file   Social Needs   ??? Financial resource strain: Not on file   ??? Food insecurity - worry: Not on file   ??? Food insecurity - inability: Not on file   ??? Transportation needs - medical: Not on file   ??? Transportation needs - non-medical: Not on file   Occupational History   ??? Not on file   Tobacco Use   ??? Smoking status: Current Every Day Smoker   ??? Smokeless tobacco: Never Used   Substance and Sexual Activity   ??? Alcohol use: No     Frequency: Never   ??? Drug use: No    ??? Sexual activity: Not Currently   Other Topics Concern   ??? Not on file   Social History Narrative   ??? Not on file         ALLERGIES: Lisinopril; Morphine; and Toradol [ketorolac]    Review of Systems   Constitutional: Negative for chills and fever.   Respiratory: Positive for shortness of breath.    Cardiovascular: Positive for chest pain.   Gastrointestinal: Positive for abdominal pain, diarrhea, nausea and vomiting.   Genitourinary: Negative for difficulty urinating, dysuria and hematuria.   Musculoskeletal: Positive for back pain (right lower).   Neurological: Negative for dizziness and headaches.   All other systems reviewed and are negative.      Vitals:    04/08/17 0104   BP: 139/77   Pulse: 99   Resp: 18   Temp: 98.2 ??F (36.8 ??C)   SpO2: 95%   Weight: 111.1 kg (245 lb)            Physical Exam   Nursing note and  vitals reviewed.     CONSTITUTIONAL: Well-appearing; well-nourished; in no apparent distress  HEAD: Normocephalic; atraumatic  EYES: PERRL; EOM intact; conjunctiva and sclera are clear bilaterally.  ENT: No rhinorrhea; normal pharynx with no tonsillar hypertrophy; mucous membranes pink/moist, no erythema, no exudate.  NECK: Supple; non-tender; no cervical lymphadenopathy  CARD: Normal S1, S2; no murmurs, rubs, or gallops. Regular rate and rhythm.  RESP: Normal respiratory effort; breath sounds clear and equal bilaterally; no wheezes, rhonchi, or rales.  ABD: Normal bowel sounds; non-distended; non-tender; no palpable organomegaly, no masses, no bruits.  Back Exam: Normal inspection; no vertebral point tenderness, no CVA tenderness. Normal range of motion.  EXT: Normal ROM in all four extremities; non-tender to palpation; no swelling or deformity; distal pulses are normal, no edema.  SKIN: Warm; dry; no rash.  NEURO:Alert and oriented x 3, coherent, NII-XII grossly intact, sensory and motor are non-focal.        MDM  Number of Diagnoses or Management Options  Chest pain, unspecified type:    Left against medical advice:   Diagnosis management comments: Assessment: [age] year old male with multiple chronic co-morbidities, who presents with history of chest pain, abdominal pain, nausea, and vomiting, and diarrhea. The patient appears well , and hemodynamically stable. Upon further evaluation. The patient has an extensive history of [place] He is wearing hospital clothes and was was only evaluated 2 days ago for the same symptoms without any objective findings. The patient needs evaluation for ACS, electrolyte abnormality, CHF, dehydration    Plan: EKG/ chest x-ray/ lab/ antiemetics and analgesia/ Monitor and Reevaluate.         Amount and/or Complexity of Data Reviewed  Clinical lab tests: ordered and reviewed  Tests in the radiology section of CPT??: ordered and reviewed  Tests in the medicine section of CPT??: reviewed and ordered  Discussion of test results with the performing providers: yes  Decide to obtain previous medical records or to obtain history from someone other than the patient: yes  Obtain history from someone other than the patient: yes  Review and summarize past medical records: yes  Discuss the patient with other providers: yes    Risk of Complications, Morbidity, and/or Mortality  Presenting problems: moderate  Diagnostic procedures: moderate  Management options: moderate           Procedures     ED EKG interpretation:  Rhythm: normal sinus rhythm; and regular . Rate (approx.): 92; Axis: normal; P wave: prolong; QRS interval: normal ; ST/T wave: non-specific changes; in  Lead: Diffusely; Other findings: abnormal ekg. This EKG was interpreted by Loni BeckwithWilliam Gigi Onstad, MD,ED Provider.    1:30 AM  Patient expresses wish to leave the emergency department against medical advice.  Adverse problems related to this decision including bodily harm, permanent disability and death have been discussed with the patient and/or  family.  The patient and/or family do not appear intoxicated and appear to be capable of understanding and making a decision of such gravity.  The patient and/or family express understanding of the possible adverse outcomes of their decision and still express the wish to leave against medical advice.  The patient and/or family were given follow up and return instructions and the available laboratory tests and imaging studies were discussed.  The patient and/or family were given the opportunity to ask questions.  The patient and/or family agree to follow up with a physician of their choice as soon as possible or to return to  the ER at any time to finish the work-up.    Progress Note:   Pt has been reexamined by Homar Weinkauf, MD.  Loni Beckwithhe patient signed out AGAINST MEDICAL ADVICE and states that he wants to go back to [place] where his family resides. Pt is ready to go home. Will send home on chest pain instruction. Outpatient referral with PCP/ cardiologist and nephrologist as soon as possible as needed. Written by Loni BeckwithWilliam Terrionna Bridwell, MD,1:35 AM    .   .

## 2017-04-08 NOTE — ED Notes (Signed)
Pt seen and discharged by Dr. Mindi SlickerAzie.

## 2017-04-08 NOTE — ED Triage Notes (Addendum)
Patient with chest pain, shortness of breath (hx of COPD wears 3-4L O2 PRN), vomiting, and diarrhea since Monday. Recent hx of C DIFF. Enteric precautions initiated.

## 2017-04-08 NOTE — ED Notes (Signed)
Patient noted to be ambulating out of ED by this triage nurse. Patient states "I just signed out AMA." Out of ED with slow steady gait.

## 2017-04-08 NOTE — ED Notes (Signed)
Pt refused treatment and V/S. Pt stated " I don't have heart attack. I don't need to be here anymore. [place]. The doctor is going to discharge me anyway". Dr. Mindi SlickerAzie made aware. Dr. Mindi SlickerAzie spoke with pt. Charge nurse Angelica ChessmanMandy, RN made aware.

## 2017-04-10 ENCOUNTER — Emergency Department: Payer: Medicare Other | Attending: Emergency Medicine

## 2017-04-10 ENCOUNTER — Inpatient Hospital Stay (HOSPITAL_COMMUNITY)
Admission: EM | Admit: 2017-04-10 | Discharge: 2017-04-13 | Discharge status: Against Medical Advice | Payer: Self-pay | Source: Other Acute Inpatient Hospital | Attending: Internal Medicine | Admitting: Internal Medicine

## 2017-04-10 ENCOUNTER — Emergency Department (EMERGENCY_DEPARTMENT_HOSPITAL)
Admission: EM | Admit: 2017-04-10 | Discharge: 2017-04-10 | Disposition: A | Discharge status: Home / Self Care | Payer: Self-pay | Source: Home / Self Care | Attending: Emergency Medicine | Admitting: Emergency Medicine

## 2017-04-10 ENCOUNTER — Inpatient Hospital Stay: DRG: 302 | Payer: Medicare Other | Attending: Internal Medicine

## 2017-04-10 DIAGNOSIS — D631 Anemia in chronic kidney disease: Secondary | ICD-10-CM | POA: Diagnosis present

## 2017-04-10 DIAGNOSIS — Z8619 Personal history of other infectious and parasitic diseases: Secondary | ICD-10-CM

## 2017-04-10 DIAGNOSIS — E8770 Fluid overload, unspecified: Secondary | ICD-10-CM | POA: Diagnosis present

## 2017-04-10 DIAGNOSIS — N186 End stage renal disease: Secondary | ICD-10-CM | POA: Diagnosis present

## 2017-04-10 DIAGNOSIS — R140 Abdominal distension (gaseous): Secondary | ICD-10-CM | POA: Insufficient documentation

## 2017-04-10 DIAGNOSIS — J45909 Unspecified asthma, uncomplicated: Secondary | ICD-10-CM | POA: Diagnosis present

## 2017-04-10 DIAGNOSIS — Z955 Presence of coronary angioplasty implant and graft: Secondary | ICD-10-CM

## 2017-04-10 DIAGNOSIS — R110 Nausea: Secondary | ICD-10-CM

## 2017-04-10 DIAGNOSIS — F17210 Nicotine dependence, cigarettes, uncomplicated: Secondary | ICD-10-CM | POA: Diagnosis present

## 2017-04-10 DIAGNOSIS — Z888 Allergy status to other drugs, medicaments and biological substances status: Secondary | ICD-10-CM

## 2017-04-10 DIAGNOSIS — T82510A Breakdown (mechanical) of surgically created arteriovenous fistula, initial encounter: Secondary | ICD-10-CM | POA: Diagnosis present

## 2017-04-10 DIAGNOSIS — I25110 Atherosclerotic heart disease of native coronary artery with unstable angina pectoris: Secondary | ICD-10-CM | POA: Diagnosis present

## 2017-04-10 DIAGNOSIS — R197 Diarrhea, unspecified: Secondary | ICD-10-CM

## 2017-04-10 DIAGNOSIS — A0471 Enterocolitis due to Clostridium difficile, recurrent: Secondary | ICD-10-CM | POA: Diagnosis present

## 2017-04-10 DIAGNOSIS — Z2889 Immunization not carried out for other reason: Secondary | ICD-10-CM

## 2017-04-10 DIAGNOSIS — I10 Essential (primary) hypertension: Secondary | ICD-10-CM

## 2017-04-10 DIAGNOSIS — G4733 Obstructive sleep apnea (adult) (pediatric): Secondary | ICD-10-CM | POA: Diagnosis present

## 2017-04-10 DIAGNOSIS — Z992 Dependence on renal dialysis: Secondary | ICD-10-CM

## 2017-04-10 DIAGNOSIS — R079 Chest pain, unspecified: Secondary | ICD-10-CM

## 2017-04-10 DIAGNOSIS — I252 Old myocardial infarction: Secondary | ICD-10-CM

## 2017-04-10 DIAGNOSIS — R0602 Shortness of breath: Secondary | ICD-10-CM

## 2017-04-10 DIAGNOSIS — Y712 Prosthetic and other implants, materials and accessory cardiovascular devices associated with adverse incidents: Secondary | ICD-10-CM | POA: Diagnosis present

## 2017-04-10 DIAGNOSIS — Z8261 Family history of arthritis: Secondary | ICD-10-CM

## 2017-04-10 DIAGNOSIS — Z8249 Family history of ischemic heart disease and other diseases of the circulatory system: Secondary | ICD-10-CM

## 2017-04-10 DIAGNOSIS — R0789 Other chest pain: Secondary | ICD-10-CM

## 2017-04-10 DIAGNOSIS — I200 Unstable angina: Secondary | ICD-10-CM | POA: Diagnosis present

## 2017-04-10 DIAGNOSIS — I120 Hypertensive chronic kidney disease with stage 5 chronic kidney disease or end stage renal disease: Secondary | ICD-10-CM | POA: Diagnosis present

## 2017-04-10 DIAGNOSIS — Z885 Allergy status to narcotic agent status: Secondary | ICD-10-CM

## 2017-04-10 DIAGNOSIS — Z9119 Patient's noncompliance with other medical treatment and regimen: Secondary | ICD-10-CM

## 2017-04-10 DIAGNOSIS — N2581 Secondary hyperparathyroidism of renal origin: Secondary | ICD-10-CM | POA: Diagnosis present

## 2017-04-10 HISTORY — DX: Sleep apnea, unspecified: G47.30

## 2017-04-10 HISTORY — DX: Atherosclerotic heart disease of native coronary artery without angina pectoris: I25.10

## 2017-04-10 LAB — COMPREHENSIVE METABOLIC PANEL
ALT: 16 U/L (ref 0–55)
ALT: 15 U/L (ref 0–55)
AST (SGOT): 27 U/L (ref 10–42)
AST (SGOT): 26 U/L (ref 10–42)
Albumin/Globulin Ratio: 0.86 Ratio (ref 0.70–1.50)
Albumin/Globulin Ratio: 0.79 Ratio (ref 0.70–1.50)
Albumin: 2.9 gm/dL — ABNORMAL LOW (ref 3.5–5.0)
Albumin: 3 gm/dL — ABNORMAL LOW (ref 3.5–5.0)
Alkaline Phosphatase: 237 U/L — ABNORMAL HIGH (ref 40–145)
Alkaline Phosphatase: 254 U/L — ABNORMAL HIGH (ref 40–145)
Anion Gap: 19.3 mMol/L — ABNORMAL HIGH (ref 7.0–18.0)
Anion Gap: 19.3 mMol/L — ABNORMAL HIGH (ref 7.0–18.0)
BUN / Creatinine Ratio: 5.8 Ratio — ABNORMAL LOW (ref 10.0–30.0)
BUN / Creatinine Ratio: 6.2 Ratio — ABNORMAL LOW (ref 10.0–30.0)
BUN: 43 mg/dL — ABNORMAL HIGH (ref 7–22)
BUN: 47 mg/dL — ABNORMAL HIGH (ref 7–22)
Bilirubin, Total: 0.5 mg/dL (ref 0.1–1.2)
Bilirubin, Total: 0.7 mg/dL (ref 0.1–1.2)
CO2: 25 mMol/L (ref 20.0–30.0)
CO2: 22.3 mMol/L (ref 20.0–30.0)
Calcium: 7.1 mg/dL — ABNORMAL LOW (ref 8.5–10.5)
Calcium: 7.5 mg/dL — ABNORMAL LOW (ref 8.5–10.5)
Chloride: 103 mMol/L (ref 98–110)
Chloride: 103 mMol/L (ref 98–110)
Creatinine: 7.4 mg/dL — ABNORMAL HIGH (ref 0.80–1.30)
Creatinine: 7.56 mg/dL — ABNORMAL HIGH (ref 0.80–1.30)
EGFR: 8 mL/min/{1.73_m2} — ABNORMAL LOW (ref 60–150)
EGFR: 9 mL/min/{1.73_m2} — ABNORMAL LOW (ref 60–150)
Globulin: 3.3 gm/dL (ref 2.0–4.0)
Globulin: 3.8 gm/dL (ref 2.0–4.0)
Glucose: 118 mg/dL — ABNORMAL HIGH (ref 71–99)
Glucose: 89 mg/dL (ref 71–99)
Osmolality Calc: 297 mOsm/kg (ref 275–300)
Osmolality Calc: 291 mOsm/kg (ref 275–300)
Potassium: 4.3 mMol/L (ref 3.5–5.3)
Potassium: 4.6 mMol/L (ref 3.5–5.3)
Protein, Total: 6.2 gm/dL (ref 6.0–8.3)
Protein, Total: 6.8 gm/dL (ref 6.0–8.3)
Sodium: 143 mMol/L (ref 136–147)
Sodium: 140 mMol/L (ref 136–147)

## 2017-04-10 LAB — ECG 12-LEAD
P Wave Axis: 58 deg
P-R Interval: 183 ms
Patient Age: 47 years
Q-T Interval(Corrected): 493 ms
Q-T Interval: 378 ms
QRS Axis: 1 deg
QRS Duration: 95 ms
T Axis: 77 years
Ventricular Rate: 102 //min

## 2017-04-10 LAB — CBC AND DIFFERENTIAL
Basophils %: 0.2 % (ref 0.0–3.0)
Basophils %: 0.4 % (ref 0.0–3.0)
Basophils Absolute: 0 10*3/uL (ref 0.0–0.3)
Basophils Absolute: 0 10*3/uL (ref 0.0–0.3)
Eosinophils %: 5.7 % (ref 0.0–7.0)
Eosinophils %: 5 % (ref 0.0–7.0)
Eosinophils Absolute: 0.3 10*3/uL (ref 0.0–0.8)
Eosinophils Absolute: 0.3 10*3/uL (ref 0.0–0.8)
Hematocrit: 24.7 % — ABNORMAL LOW (ref 39.0–52.5)
Hematocrit: 25.1 % — ABNORMAL LOW (ref 39.0–52.5)
Hemoglobin: 7.9 gm/dL — ABNORMAL LOW (ref 13.0–17.5)
Hemoglobin: 8.3 gm/dL — ABNORMAL LOW (ref 13.0–17.5)
Lymphocytes Absolute: 0.4 10*3/uL — ABNORMAL LOW (ref 0.6–5.1)
Lymphocytes Absolute: 0.6 10*3/uL (ref 0.6–5.1)
Lymphocytes: 7.7 % — ABNORMAL LOW (ref 15.0–46.0)
Lymphocytes: 9.3 % — ABNORMAL LOW (ref 15.0–46.0)
MCH: 31 pg (ref 28–35)
MCH: 32 pg (ref 28–35)
MCHC: 32 gm/dL (ref 32–36)
MCHC: 33 gm/dL (ref 32–36)
MCV: 96 fL (ref 80–100)
MCV: 95 fL (ref 80–100)
MPV: 7.1 fL (ref 6.0–10.0)
MPV: 7.6 fL (ref 6.0–10.0)
Monocytes Absolute: 0.5 10*3/uL (ref 0.1–1.7)
Monocytes Absolute: 0.6 10*3/uL (ref 0.1–1.7)
Monocytes: 8.4 % (ref 3.0–15.0)
Monocytes: 8.5 % (ref 3.0–15.0)
Neutrophils %: 78 % (ref 42.0–78.0)
Neutrophils %: 76.8 % (ref 42.0–78.0)
Neutrophils Absolute: 4.2 10*3/uL (ref 1.7–8.6)
Neutrophils Absolute: 5 10*3/uL (ref 1.7–8.6)
PLT CT: 119 10*3/uL — ABNORMAL LOW (ref 130–440)
PLT CT: 145 10*3/uL (ref 130–440)
RBC: 2.56 10*6/uL — ABNORMAL LOW (ref 4.00–5.70)
RBC: 2.64 10*6/uL — ABNORMAL LOW (ref 4.00–5.70)
RDW: 14.6 % — ABNORMAL HIGH (ref 11.0–14.0)
RDW: 14.7 % — ABNORMAL HIGH (ref 11.0–14.0)
WBC: 5.4 10*3/uL (ref 4.0–11.0)
WBC: 6.5 10*3/uL (ref 4.0–11.0)

## 2017-04-10 LAB — VH LACTOFERRIN: Lactoferrin, Qual: NEGATIVE

## 2017-04-10 LAB — VH C. DIFFICILE TOXIN B GENE BY DNA AMPLIFICATION: Stool Clostridium difficile Toxin B Gene DNA Amplification: POSITIVE — CR

## 2017-04-10 LAB — TROPONIN I
Troponin I: 0.13 ng/mL — ABNORMAL HIGH (ref 0.00–0.02)
Troponin I: 0.14 ng/mL — ABNORMAL HIGH (ref 0.00–0.02)
Troponin I: 0.13 ng/mL — ABNORMAL HIGH (ref 0.00–0.02)
Troponin I: 0.11 ng/mL — ABNORMAL HIGH (ref 0.00–0.02)

## 2017-04-10 LAB — B-TYPE NATRIURETIC PEPTIDE: B-Natriuretic Peptide: 3149.4 pg/mL — ABNORMAL HIGH (ref 0.0–100.0)

## 2017-04-10 MED ORDER — HYDROMORPHONE HCL 1 MG/ML IJ SOLN
INTRAMUSCULAR | Status: AC
Start: 2017-04-10 — End: ?
  Filled 2017-04-10: qty 1

## 2017-04-10 MED ORDER — METRONIDAZOLE 500 MG PO TABS
500.00 mg | ORAL_TABLET | Freq: Once | ORAL | Status: AC
Start: 2017-04-10 — End: 2017-04-10
  Administered 2017-04-10: 07:00:00 500 mg via ORAL

## 2017-04-10 MED ORDER — VH HEPARIN SODIUM (PORCINE) 5000 UNIT/ML IJ SOLN
5000.00 [IU] | Freq: Three times a day (TID) | INTRAMUSCULAR | Status: DC
Start: 2017-04-10 — End: 2017-04-13
  Filled 2017-04-10 (×12): qty 1

## 2017-04-10 MED ORDER — DOCUSATE SODIUM 100 MG PO CAPS
100.00 mg | ORAL_CAPSULE | Freq: Every day | ORAL | Status: DC
Start: 2017-04-11 — End: 2017-04-13
  Filled 2017-04-10 (×3): qty 1

## 2017-04-10 MED ORDER — NALOXONE HCL 0.4 MG/ML IJ SOLN (WRAP)
0.40 mg | INTRAMUSCULAR | Status: DC | PRN
Start: 2017-04-10 — End: 2017-04-13

## 2017-04-10 MED ORDER — VH HYDROMORPHONE HCL PF 1 MG/ML CARPUJECT
0.50 mg | Freq: Once | INTRAMUSCULAR | Status: AC
Start: 2017-04-10 — End: 2017-04-10
  Administered 2017-04-10: 08:00:00 0.5 mg via INTRAVENOUS

## 2017-04-10 MED ORDER — PANTOPRAZOLE SODIUM 40 MG PO TBEC
40.00 mg | DELAYED_RELEASE_TABLET | Freq: Every day | ORAL | Status: DC
Start: 2017-04-10 — End: 2017-04-13
  Administered 2017-04-10 – 2017-04-13 (×4): 40 mg via ORAL
  Filled 2017-04-10 (×4): qty 1

## 2017-04-10 MED ORDER — ONDANSETRON 4 MG PO TBDP
4.00 mg | ORAL_TABLET | Freq: Four times a day (QID) | ORAL | Status: DC | PRN
Start: 2017-04-10 — End: 2017-04-13
  Administered 2017-04-10: 13:00:00 4 mg via ORAL
  Filled 2017-04-10: qty 1

## 2017-04-10 MED ORDER — VH HYDROMORPHONE HCL PF 1 MG/ML CARPUJECT
0.50 mg | Freq: Once | INTRAMUSCULAR | Status: AC
Start: 2017-04-10 — End: 2017-04-10
  Administered 2017-04-10: 07:00:00 0.5 mg via INTRAVENOUS

## 2017-04-10 MED ORDER — AMLODIPINE BESYLATE 5 MG PO TABS
10.00 mg | ORAL_TABLET | Freq: Every day | ORAL | Status: DC
Start: 2017-04-10 — End: 2017-04-13
  Administered 2017-04-10: 11:00:00 10 mg via ORAL
  Filled 2017-04-10 (×5): qty 2

## 2017-04-10 MED ORDER — SODIUM CHLORIDE 0.9 % IJ SOLN
3.00 mL | Freq: Three times a day (TID) | INTRAMUSCULAR | Status: DC
Start: 2017-04-10 — End: 2017-04-13
  Administered 2017-04-10 – 2017-04-13 (×9): 3 mL via INTRAVENOUS

## 2017-04-10 MED ORDER — FIDAXOMICIN 200 MG PO TABS
200.00 mg | ORAL_TABLET | Freq: Two times a day (BID) | ORAL | Status: DC
Start: 2017-04-10 — End: 2017-04-13
  Administered 2017-04-10 – 2017-04-13 (×7): 200 mg via ORAL
  Filled 2017-04-10 (×8): qty 1

## 2017-04-10 MED ORDER — SODIUM CHLORIDE 0.9 % IV BOLUS
250.0000 mL | INTRAVENOUS | Status: AC | PRN
Start: 2017-04-10 — End: 2017-04-10

## 2017-04-10 MED ORDER — ALBUTEROL-IPRATROPIUM 2.5-0.5 (3) MG/3ML IN SOLN
3.00 mL | Freq: Four times a day (QID) | RESPIRATORY_TRACT | Status: DC
Start: 2017-04-10 — End: 2017-04-11
  Administered 2017-04-10 – 2017-04-11 (×2): 3 mL via RESPIRATORY_TRACT
  Filled 2017-04-10 (×8): qty 3

## 2017-04-10 MED ORDER — ONDANSETRON HCL 4 MG/2ML IJ SOLN
4.00 mg | Freq: Once | INTRAMUSCULAR | Status: AC
Start: 2017-04-10 — End: 2017-04-10
  Administered 2017-04-10: 07:00:00 4 mg via INTRAVENOUS

## 2017-04-10 MED ORDER — ASPIRIN 81 MG PO CHEW
324.00 mg | CHEWABLE_TABLET | Freq: Once | ORAL | Status: DC
Start: 2017-04-10 — End: 2017-04-10

## 2017-04-10 MED ORDER — SODIUM CHLORIDE 0.9 % IV BOLUS
100.0000 mL | INTRAVENOUS | Status: AC | PRN
Start: 2017-04-10 — End: 2017-04-10

## 2017-04-10 MED ORDER — NITROGLYCERIN 0.4 MG SL SUBL
0.40 mg | SUBLINGUAL_TABLET | SUBLINGUAL | Status: DC | PRN
Start: 2017-04-10 — End: 2017-04-13

## 2017-04-10 MED ORDER — HYDRALAZINE HCL 50 MG PO TABS
100.00 mg | ORAL_TABLET | Freq: Three times a day (TID) | ORAL | Status: DC
Start: 2017-04-10 — End: 2017-04-13
  Administered 2017-04-10 – 2017-04-12 (×6): 100 mg via ORAL
  Filled 2017-04-10 (×12): qty 2

## 2017-04-10 MED ORDER — ATORVASTATIN CALCIUM 40 MG PO TABS
80.00 mg | ORAL_TABLET | Freq: Every evening | ORAL | Status: DC
Start: 2017-04-10 — End: 2017-04-13
  Administered 2017-04-10 – 2017-04-12 (×3): 80 mg via ORAL
  Filled 2017-04-10 (×4): qty 2

## 2017-04-10 MED ORDER — SEVELAMER CARBONATE 800 MG PO TABS
800.00 mg | ORAL_TABLET | Freq: Three times a day (TID) | ORAL | Status: DC
Start: 2017-04-10 — End: 2017-04-13
  Administered 2017-04-10 – 2017-04-13 (×9): 800 mg via ORAL
  Filled 2017-04-10 (×11): qty 1

## 2017-04-10 MED ORDER — CARVEDILOL 6.25 MG PO TABS
12.50 mg | ORAL_TABLET | Freq: Two times a day (BID) | ORAL | Status: DC
Start: 2017-04-10 — End: 2017-04-12
  Administered 2017-04-10 – 2017-04-12 (×4): 12.5 mg via ORAL
  Filled 2017-04-10 (×7): qty 2

## 2017-04-10 MED ORDER — NITROGLYCERIN 0.4 MG SL SUBL
0.40 mg | SUBLINGUAL_TABLET | SUBLINGUAL | Status: DC | PRN
Start: 2017-04-10 — End: 2017-04-10
  Administered 2017-04-10 (×3): 0.4 mg via SUBLINGUAL

## 2017-04-10 MED ORDER — VALLEY VANCOMYCIN KIT ORAL SOLUTION 50 MG/ML (RPKG)
125.00 mg | Freq: Four times a day (QID) | ORAL | Status: DC
Start: 2017-04-10 — End: 2017-04-10
  Filled 2017-04-10 (×2): qty 2.5

## 2017-04-10 MED ORDER — SEVELAMER HCL 800 MG PO TABS
800.00 mg | ORAL_TABLET | Freq: Three times a day (TID) | ORAL | Status: DC
Start: 2017-04-10 — End: 2017-04-10
  Filled 2017-04-10 (×4): qty 1

## 2017-04-10 MED ORDER — NITROGLYCERIN 0.4 MG SL SUBL
SUBLINGUAL_TABLET | SUBLINGUAL | Status: AC
Start: 2017-04-10 — End: ?
  Filled 2017-04-10: qty 25

## 2017-04-10 MED ORDER — SENNOSIDES-DOCUSATE SODIUM 8.6-50 MG PO TABS
2.00 | ORAL_TABLET | Freq: Every evening | ORAL | Status: DC
Start: 2017-04-10 — End: 2017-04-13
  Filled 2017-04-10 (×4): qty 2

## 2017-04-10 MED ORDER — METRONIDAZOLE 500 MG PO TABS
ORAL_TABLET | ORAL | Status: AC
Start: 2017-04-10 — End: ?
  Filled 2017-04-10: qty 1

## 2017-04-10 MED ORDER — ONDANSETRON HCL 4 MG/2ML IJ SOLN
INTRAMUSCULAR | Status: AC
Start: 2017-04-10 — End: ?
  Filled 2017-04-10: qty 2

## 2017-04-10 MED ORDER — NITROGLYCERIN 0.4 MG/HR TD PT24
1.00 | MEDICATED_PATCH | TRANSDERMAL | Status: DC
Start: 2017-04-10 — End: 2017-04-10
  Administered 2017-04-10: 07:00:00 1 via TRANSDERMAL

## 2017-04-10 MED ORDER — VH HYDROMORPHONE HCL PF 1 MG/ML CARPUJECT
0.50 mg | INTRAMUSCULAR | Status: DC | PRN
Start: 2017-04-10 — End: 2017-04-12
  Administered 2017-04-10 (×4): 0.5 mg via INTRAVENOUS
  Administered 2017-04-11 – 2017-04-12 (×9): 1 mg via INTRAVENOUS
  Filled 2017-04-10 (×13): qty 1

## 2017-04-10 MED ORDER — NITROGLYCERIN 0.4 MG/HR TD PT24
MEDICATED_PATCH | TRANSDERMAL | Status: AC
Start: 2017-04-10 — End: ?
  Filled 2017-04-10: qty 1

## 2017-04-10 MED ORDER — VH BIO-K PLUS PROBIOTIC 50 BIL CFU CAPSULE
50.00 | DELAYED_RELEASE_CAPSULE | Freq: Every day | ORAL | Status: DC
Start: 2017-04-10 — End: 2017-04-13
  Administered 2017-04-10 – 2017-04-13 (×3): 50 via ORAL
  Filled 2017-04-10 (×3): qty 1

## 2017-04-10 NOTE — ED Notes (Signed)
JUSTIFICATION OF AMBULANCE TRANSPORT   PHYSICIAN CERTIFICATION STATEMENT    MEDICARE REGULATIONS REQUIRE THE PHYSICIAN OR HEALTHCARE PROFESSIONAL TO COMPLETE THIS CERTIFICATION PRIOR TO ALL NON-EMERGENCY TRANSPORTS FOR MEDICARE RECIPIENTS. ALL STATEMENTS MUST MATCH THE PATIENT'S MEDICAL RECORD.     PATIENT NAME:  [name],[name]  DATE:  04/10/2017  ATTENDING PROVIDER:  Nicolasa Ducking,*    What condition(s) and/or illness(es) does this patient have that prevents him/her from being transported by any means other than an ambulance?    CHIEF COMPLAINT: SHORTNESS OF BREATH  ADDITIONAL DX (if applicable):CHEST PAIN.  DIARRHEA      Specialist/Specialty not available at [facility]. Reason for transfer:  DIALYSIS PATIENT      N/A       Bed Confined- At the time of the transport: (all three conditions must apply)   .unable to get up from bed without assistance; and  .unable to ambulate; and  .unable to sit in a wheelchair for the duration of the transport     -OR-     YES      Other means of transportation are contraindicated because it would be harmful to the patient's condition. Even if no other means of transportation are available, ambulance trips must be medically necessary and not for convenience. Significant medical documentation must accompany these claims.      Electronically signed by: Nicolasa Ducking, MD  Date/Time: 04/10/2017 6:42 AM      PHYSICIAN CERTIFICATION IS GOOD 60 DAYS FROM DATE OF PHYSICIAN'S SIGNATURE - Updated 09/27/11     Nicolasa Ducking, MD  04/10/17 [phone]

## 2017-04-10 NOTE — Consults (Signed)
CONSULTATION        [id number]  12/23/20183:13 PM  08/15*  [id number]      Reason for Consultation:   ESRD    Assessment:     1. 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 SHPTH  4. Exhausted upper extremity vascular access sites  5. Essential HTN  6. Diarrhea, etiology to be determined  7. Hx of CAD  8. Hx of asthma  9. OSA  10. Hx of recurrent C diff colitis  11. Hx of recurrent ascites (from care everywhere review)      Plan:      Unable to confirm his previous dialysis regimen and target weight.   Case manager will need to work on getting him transferred from his home center in [place] to the [facility] center which is his preference.     He does not have a permanent place of residence in the region but states it is his intention to find one and hence won't be returning to [place].     Renal Diet and protein supplements recommended but he declines and is asking for more liberal diet against medical advice.   Meds reviewed and adjusted for ESRD and dialysis.   Will resume usual EPO and activated vitamin D once his current doses can be confirmed with the outpatient unit.     No urgent need for dialysis today; plan to run 04/11/17      We will follow the patient with you during this hospitalization.  Ilda Mori, MD, thank you for the opportunity to assist in the care of this patient.    Please contact me with any questions or issues.  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  He endorses a history of recurrent C. difficile colitis and prior consideration of fecal transplant by his previous care teams (unconfirmed).  Patient states  that he currently receives dialysis on a MWF schedule in [place], [place] and was last dialyzed 04/08/17.  I do not have access to his previous outpatient records and his home dialysis center is presently closed.  It appears he is been to the ED at the [facility] on at least 2 occasions earlier this month for similar complaints of dyspnea and chest pain.    The patient endorses to me that he has moved to the [place] area and does not plan to return to [place].  He did not make any plans for transfer of care to the [facility] dialysis facility however, hoping instead to call them on Monday for admission there.    The patient's primary dialysis access is a left IJ permacath having exhausted all previous upper extremity sites bilaterally.  He reports there is been discussion about placement of a leg graft which he has declined to date.    Additional history obtained from care everywhere indicates a history of recurrent ascites, previous 2 vessel CAD with prior PCI and stenting.    Current chest x-ray demonstrates mild vascular congestion without overt CHF.  The patient is uncertain of his current target weight.  Past Medical History:     Past Medical History:   Diagnosis Date   . Asthma    . Coronary artery disease    . Heart attack 2010    at [facility] , had 2 stents placed post MI,   . Hypertension    . Kidney failure 2008    on renal since 2008   . Sleep apnea     uses sleep apnea for SOB       Past Surgical History:     Past Surgical History:   Procedure Laterality Date   . ABDOMINAL SURGERY     . biopsey      Liver biopsey 2017 checking for cancer and pt. states it was negative   . INGUINAL HERNIA REPAIR Right 2016    [facility]   . pericentesis      every two weeks starting  january 2018; Pt. states they take off 4- 8 liters off. Next due this week.   Marland Kitchen REPAIR, UMBILICAL HERNIA  2016    [facility]         Family History:     Family History   Problem Relation Age of Onset   . Hypertension Mother    . Heart attack  Maternal Grandmother    . Arthritis Paternal Grandfather          Social History:     Social History     Social History   . Marital status: Divorced     Spouse name: N/A   . Number of children: N/A   . Years of education: N/A     Social History Main Topics   . Smoking status: Current Every Day Smoker     Packs/day: 0.25     Years: 20.00     Types: Cigarettes   . Smokeless tobacco: Never Used   . Alcohol use No   . Drug use: No   . Sexual activity: Not on file     Other Topics Concern   . Not on file     Social History Narrative   . No narrative on file         Allergies:     Allergies   Allergen Reactions   . Lisinopril Anaphylaxis   . Morphine Anaphylaxis     Quite breathing   . Toradol [Ketorolac Tromethamine] Anaphylaxis       Medications:     Prior to Admission medications    Medication Sig Start Date End Date Taking? Authorizing Provider   albuterol (PROVENTIL HFA;VENTOLIN HFA) 108 (90 Base) MCG/ACT inhaler Inhale 2 puffs into the lungs every 4 (four) hours as needed for Shortness of Breath.   Yes [provider]   amLODIPine (NORVASC) 10 MG tablet Take 10 mg by mouth daily.   Yes [provider]   carvedilol (COREG) 12.5 MG tablet Take 12.5 mg by mouth.   Yes [provider]   hydrALAZINE (APRESOLINE) 100 MG tablet Take 100 mg by mouth 3 (three) times daily.   Yes [provider]   sevelamer (RENAGEL) 800 MG tablet Take 800 mg by mouth 3 (three) times daily with meals.   Yes [provider]   albuterol-ipratropium (COMBIVENT RESPIMAT) 20-100 MCG/ACT Aero Soln Inhale 1 puff into the lungs 4 (four) times daily.    [provider]      Scheduled Meds:  Current Facility-Administered Medications   Medication Dose Route Frequency   . albuterol-ipratropium  3 mL Nebulization Q6H SCH   .  amLODIPine  10 mg Oral Daily   . atorvastatin  80 mg Oral QHS   . carvedilol  12.5 mg Oral Q12H SCH   . [START ON 04/11/2017] docusate sodium  100 mg Oral Daily   . fidaxomicin   200 mg Oral BID   . heparin (porcine)  5,000 Units Subcutaneous Q8H SCH   . hydrALAZINE  100 mg Oral TID   . lactobacillus species  50 Billion CFU Oral Daily   . pantoprazole  40 mg Oral Daily   . senna-docusate  2 tablet Oral QHS   . sevelamer  800 mg Oral TID MEALS   . sodium chloride (PF)  3 mL Intravenous Q8H     Continuous Infusions:  PRN Meds:.HYDROmorphone, naloxone, nitroglycerin, ondansetron    Review of Systems:     See HPI above for pertinent positives and negatives; all other systems reviewed and negative.      Physical Exam:     Vitals: BP 145/90   Pulse 95   Temp 97.7 F (36.5 C) (Oral)   Resp 19     I/O:   Intake/Output Summary (Last 24 hours) at 04/10/17 1513  Last data filed at 04/10/17 1330   Gross per 24 hour   Intake              240 ml   Output                0 ml   Net              240 ml     Weights:  Wt Readings from Last 1 Encounters:   04/10/17 0515 111.1 kg (245 lb)       Physical Exam   Constitutional: AA male who is oriented to person, place, and time. No distress.   HEENT: Normocephalic and atraumatic. Mouth/Throat: Oropharynx is clear and moist.   Eyes: Conjunctivae normal and EOM are normal. Pupils are equal, round, and reactive to light. No scleral icterus. No proptosis or exopthalmos.  Neck: Normal range of motion. Neck supple. No JVD present.   Cardiovascular: Normal rate, regular rhythm, normal heart sounds and intact distal pulses.  Exam reveals no gallop and no friction rub.  No murmur heard.  Pulmonary/Chest: Effort normal and breath sounds normal. No respiratory distress, has no wheezes, has no rales, no rub and exhibits no tenderness.   Abdominal: Soft. Bowel sounds are normal.  No distension. No palpable masses. There is no tenderness. There is no rebound and no guarding.   Musculoskeletal: No synovitis, tophi or nodules.  Extremities: No peripheral edema, clubbing, or cyanosis.   Lymphadenopathy: No palpable LAD  Neurological:  alert and oriented to person, place, and  time.  has symmetric reflexes.    Skin: Skin is warm and dry. No rash noted. Not diaphoretic. No erythema. No pallor.   Psychiatric:  normal mood and affect. behavior is normal. Thought content normal.   Dialysis Access: Left IJ Permacath site without erythema; multiple old UE AVF/AVG sites bilaterally all without thrills or bruits      Labs Reviewed:     I have reviewed all the lab results for this admission.    Recent Labs      04/10/17   1009  04/10/17   0536   WBC  6.5  5.4   Hemoglobin  8.3*  7.9*   Hematocrit  25.1*  24.7*   PLT CT  145  119*     No results for  input(s): PT, INR, APTT in the last 72 hours.  Recent Labs      04/10/17   1009  04/10/17   0536   Troponin I  0.14*  0.13*     Recent Labs      04/10/17   1009   B-Natriuretic Peptide  3,149.4*                      Recent Labs      04/10/17   1009  04/10/17   0536   Glucose  89  118*   Sodium  140  143   Potassium  4.6  4.3   Chloride  103  103   CO2  22.3  25.0   BUN  47*  43*   Creatinine  7.56*  7.40*   EGFR  9*  8*   Calcium  7.5*  7.1*     No results for input(s): MG, PHOS in the last 72 hours.  Recent Labs      04/10/17   1009  04/10/17   0536   Albumin  3.0*  2.9*   Protein, Total  6.8  6.2   Bilirubin, Total  0.7  0.5   Alkaline Phosphatase  254*  237*   ALT  15  16   AST (SGOT)  26  27    No results for input(s): SGUR, LABPH, PROTEINUR, GLUUA, KETONESUA, BILIUA, BLOODUA, NITRITEUA, UROBILIUA, LEUA, NITRITEUA, WBCUA, RBCUA, BACTERIAUA in the last 72 hours.    Invalid input(s):  AMORPHOUSUA                 Rads:   Radiological Procedures reviewed for this admission.     Xr Abdomen Ap    Result Date: 04/10/2017  No definite acute process in the abdomen. Chronic pleural thickening versus small bilateral effusions ReadingStation:[id number]    Xr Chest Ap Portable    Result Date: 04/10/2017  Minimal pulmonary edema. ReadingStation:[id number]          Cheral Marker, Pager [phone]  3:13 PM  04/10/2017  [facility]  Dr.  Kai Levins, Dr. Vallery Sa, Dr. Lajean Saver  Office: [phone]      cc: Ilda Mori, MD  Christa See, MD

## 2017-04-10 NOTE — Plan of Care (Signed)
Problem: Moderate/High Fall Risk Score >5  Goal: Patient will remain free of falls  Outcome: Progressing   04/10/17 1328   OTHER   Moderate Risk (6-13) LOW-Fall Interventions Appropriate for Low Fall Risk;LOW-Anticoagulation education for injury risk

## 2017-04-10 NOTE — Consults (Signed)
[facility]  Consultation Note    Date Time: 04/10/17 1:12 PM  Patient Name: [name], [name]  MRN#: [id number]  DOB: 12/01/1969  Consulting Physician: Ilda Mori, MD    Reason for Consult:   The patient was seen at the request of Ilda Mori, MD for the evaluation of chest pain      Patient sitting in bed refusing to lie down for exam -states due to abdominal pain  Dozing off to sleep  History:   Arlee Santosuosso is a 46 y.o. male who presents with chest pain  Patient states MI 2010 and received 2 stents-has not had any cardiac follow up No Patient Care Coordination Note on file.   states few months of chest pain and has worsened since last night-left sided chest pain'something sitting on his chest"  Also associated with shortness of breatgh  Had cold sweat last night  Denies dizziness      Past Medical History:     Past Medical History:   Diagnosis Date   . Asthma    . Coronary artery disease    . Heart attack 2010    at [facility] , had 2 stents placed post MI,   . Hypertension    . Kidney failure 2008    on renal since 2008   . Sleep apnea     uses sleep apnea for SOB   ESRD--on dialysis    Long history of anemia -gets blood transfusion 1-2/month  States history of bleeding in stool -underwent work up 2015 no obvious source of bleeding was found  Atrophic kindneys  He has significanst asc ites and gets paracentesis once every 2 weeks  Past Surgical History:     Past Surgical History:   Procedure Laterality Date   . ABDOMINAL SURGERY     . biopsey      Liver biopsey 2017 checking for cancer and pt. states it was negative   . INGUINAL HERNIA REPAIR Right 2016    [facility]   . pericentesis      every two weeks starting  january 2018; Pt. states they take off 4- 8 liters off. Next due this week.   Marland Kitchen REPAIR, UMBILICAL HERNIA  2016    [facility]       Problem List:   Active Problems:    Unstable angina      Allergies:     Allergies   Allergen Reactions   . Lisinopril Anaphylaxis   . Morphine Anaphylaxis      Quite breathing   . Toradol [Ketorolac Tromethamine] Anaphylaxis       Medications:     Current Facility-Administered Medications   Medication Dose Route Frequency   . albuterol-ipratropium  3 mL Nebulization Q6H SCH   . amLODIPine  10 mg Oral Daily   . atorvastatin  80 mg Oral QHS   . carvedilol  12.5 mg Oral Q12H SCH   . [START ON 04/11/2017] docusate sodium  100 mg Oral Daily   . fidaxomicin  200 mg Oral BID   . heparin (porcine)  5,000 Units Subcutaneous Q8H SCH   . hydrALAZINE  100 mg Oral TID   . lactobacillus species  50 Billion CFU Oral Daily   . pantoprazole  40 mg Oral Daily   . senna-docusate  2 tablet Oral QHS   . sevelamer  800 mg Oral TID MEALS   . sodium chloride (PF)  3 mL Intravenous Q8H       Prior to  Admission medications    Medication Sig Start Date End Date Taking? Authorizing Provider   albuterol (PROVENTIL HFA;VENTOLIN HFA) 108 (90 Base) MCG/ACT inhaler Inhale 2 puffs into the lungs every 4 (four) hours as needed for Shortness of Breath.   Yes [provider]   amLODIPine (NORVASC) 10 MG tablet Take 10 mg by mouth daily.   Yes [provider]   carvedilol (COREG) 12.5 MG tablet Take 12.5 mg by mouth.   Yes [provider]   hydrALAZINE (APRESOLINE) 100 MG tablet Take 100 mg by mouth 3 (three) times daily.   Yes [provider]   sevelamer (RENAGEL) 800 MG tablet Take 800 mg by mouth 3 (three) times daily with meals.   Yes [provider]   albuterol-ipratropium (COMBIVENT RESPIMAT) 20-100 MCG/ACT Aero Soln Inhale 1 puff into the lungs 4 (four) times daily.    [provider]   Lasix 80 mg daily    Family History:     Family History   Problem Relation Age of Onset   . Hypertension Mother    . Heart attack Maternal Grandmother    . Arthritis Paternal Grandfather        Social History:     Social History     Social History   . Marital status: Divorced     Spouse name: N/A   . Number of children: N/A   . Years of education: N/A     Occupational  History   . Not on file.     Social History Main Topics   . Smoking status: Current Every Day Smoker     Packs/day: 0.25     Years: 20.00     Types: Cigarettes   . Smokeless tobacco: Never Used   . Alcohol use No   . Drug use: No   . Sexual activity: Not on file     Other Topics Concern   . Not on file     Social History Narrative   . No narrative on file       Review of Systems:     All systems reviewed and negative other than those noted in the HPI.    Physical Exam:     Tele--off informed nursing staff  Value Min Max   Temp 97.5 F (36.4 C) 98.2 F (36.8 C)   Heart Rate 95 113   Resp Rate 16 30   BP: Systolic 144 173   BP: Diastolic 86 127   SpO2 100 % 100 %     No intake or output data in the 24 hours ending 04/10/17 1312  Was examined sitting in bed  Refused to lie down  General/Constitutional: no acute distress-falling asleep intermittently  HEENT: moist mucous membranes  Neck: supple, no  JVD  Heart/Cardiovascular:   RRR  systolic murmurs/rubs/gallops  2+ radial pulses  2+ femoral pulses--unable to check  0 DP pulses  0 PT pulses  No bruits throughout  Respiratory/Lungs: non-labored, clear to auscultation throughout  Abdominal/Gastrointestinal: distended ? Ascites -pt sitting up  Extremities: warm and well-perfused, 2 plus edema lower legs  Skin: excoriation skin back  Neurological: falling asleep   Psych: mild agitation      Labs Reviewed:   Recent CMP   Recent Labs  Lab 04/10/17  1009 04/10/17  0536   Glucose 89 118*   BUN 47* 43*   Creatinine 7.56* 7.40*   Sodium 140 143   Potassium 4.6 4.3   CO2 22.3 25.0  Calcium 7.5* 7.1*   AST (SGOT) 26 27   ALT 15 16   Bilirubin, Total 0.7 0.5   Globulin 3.8 3.3       Recent CARDIAC ENZYMES   Recent Labs  Lab 04/10/17  1009 04/10/17  0536   Troponin I 0.14* 0.13*   B-Natriuretic Peptide 3,149.4*  --        Recent TSH       Recent PT/PTT       Recent CBC WITH DIFF   Recent Labs  Lab 04/10/17  1009 04/10/17  0536   WBC 6.5 5.4   RBC 2.64* 2.56*   Hemoglobin 8.3* 7.9*    Hematocrit 25.1* 24.7*   MCV 95 96   MPV 7.6 7.1   PLT CT 145 119*       Recent LIPID PANEL   No results found for: CHOL No results found for: TRIG No results found for: HDL No results found for: LDL     Rads:   CXR 04/09/17  Tip of left permacath projects over the region of right atrium, about 13 cm below the level of carina. Right subclavian stent.  Lungs: Minimal diffuse interstitial opacities    Pleural space: Within normal limits.  Mediastinum: Cardiomegaly.  Bones: Within normal limits for the patient's age.  Soft tissues: Within normal limits.   IMPRESSION:   Minimal pulmonary edema.    Radiology Results (24 Hour)     Procedure Component Value Units Date/Time    XR Abdomen AP [[id number]] Collected:  04/10/17 1203    Order Status:  Completed Updated:  04/10/17 1204    Narrative:       Clinical History:  Reason For Exam: abd pain with rec CDAD  Patient is here for and abdomin X-ray for abdomin pain with rec CDAD  Patient refused to lay down for supine abdomin film. Transport @1120hrs     Examination:  XR ABDOMEN AP    Comparison:  None available.    Findings:  Bowel gas pattern is normal. No obvious masses or worrisome calcifications. Bones and soft tissues unremarkable.  .  Blunting of the costophrenic sulci bilaterally which could be acute or chronic    Impression:       No definite acute process in the abdomen.  Chronic pleural thickening versus small bilateral effusions    ReadingStation:[id number]          Cardiovascular Workup:   ECG 04/10/17   Sinus tachycardia -102  Probable left atrial enlargement   Borderline prolonged QT interval   Baseline wander in lead(s) V2   No previous ECG available for comparison        Assessment:   Takai Chiaramonte is 46 y.o. male with shortness of breath and chest pain  Elevated troponin and BNP in setting of ESRD  Status post coronary stents  Current smoker  ESRD  Anemia-requiring transfusions --recommend heme consult--stool neg for blood  Ascites  Abdominal pain and  c.difficile    Recommendations:     1. Echocardiogram  2. If intervention done patient woould need antiplatelets-need a diagnosis for anemia if possible  3.        Will decide re cath vs nuclear test pending result of echocardiogram      Thank you for consulting and allowing [place] to participate in this patient's care! We will follow along while the patient is in the hospital.    Shirl Harris  [phone]  [facility]  [address],  [place], [place] [address]  Tel [phone]  Fax [phone]

## 2017-04-10 NOTE — ED Provider Notes (Signed)
Physician/Midlevel provider first contact with patient: 04/10/17 0513         History     Chief Complaint   Patient presents with   . Shortness of Breath     HPI [name] is visiting in town, he has never ben to to a [facility] before.  He reports that he has had increasing diarrhea for one week, and he says it same as the C.Diff that he has had in the past, he stopped flagyl 2 weeks ago for a bout of C.Diff. He says he was just discharged from a hospital in [place].  He reports that he has history of MI with stents in 2010, and this evening about three hours ago developed chest pressure "like something sitting on my chest" along with shortness of breath.  He has also had some abdominal cramping. He gets dialysis M,W,F and he did have dialysis on Friday. He says he refused a unit of blood Friday during dialysis, which he says he gets every other weeks for unexplained GI blood loss.  No leg pain or swelling, no palpitations or syncope/pre-syncope.  He feels like he is improving now that he is here and has passed a large diarrheal stool.      Past Medical History:   Diagnosis Date   . Asthma    . Coronary artery disease    . Heart attack 2010    at [facility] , had 2 stents placed post MI,   . Hypertension    . Kidney failure 2008    on renal since 2008   . Sleep apnea     uses sleep apnea for SOB       Past Surgical History:   Procedure Laterality Date   . ABDOMINAL SURGERY     . biopsey      Liver biopsey 2017 checking for cancer and pt. states it was negative   . INGUINAL HERNIA REPAIR Right 2016    [facility]   . pericentesis      every two weeks starting  january 2018; Pt. states they take off 4- 8 liters off. Next due this week.   Marland Kitchen REPAIR, UMBILICAL HERNIA  2016    [facility]       Family History   Problem Relation Age of Onset   . Hypertension Mother    . Heart attack Maternal Grandmother    . Arthritis Paternal Grandfather        Social  Social History   Substance Use Topics   . Smoking status: Current Every Day  Smoker     Packs/day: 0.25     Years: 20.00     Types: Cigarettes   . Smokeless tobacco: Never Used   . Alcohol use No       .     Allergies   Allergen Reactions   . Lisinopril Anaphylaxis   . Morphine Anaphylaxis     Quite breathing   . Toradol [Ketorolac Tromethamine] Anaphylaxis       Home Medications     Med List Status:  In Progress Set By: Talbert Cage, RN at 04/10/2017  5:57 AM                albuterol-ipratropium (COMBIVENT RESPIMAT) 20-100 MCG/ACT Aero Soln     Inhale 1 puff into the lungs 4 (four) times daily.     amLODIPine (NORVASC) 10 MG tablet     Take 10 mg by mouth daily.     carvedilol (  COREG) 12.5 MG tablet     Take 12.5 mg by mouth.     hydrALAZINE (APRESOLINE) 100 MG tablet     Take 100 mg by mouth 3 (three) times daily.     sevelamer (RENAGEL) 800 MG tablet     Take 800 mg by mouth 3 (three) times daily with meals.           Review of Systems   Constitutional: Negative.    HENT: Negative.    Eyes: Negative.    Respiratory: Positive for shortness of breath.    Cardiovascular: Positive for chest pain.   Gastrointestinal: Positive for abdominal distention, diarrhea and nausea. Negative for abdominal pain, anal bleeding, blood in stool, constipation, rectal pain and vomiting.   Genitourinary: Negative.    Musculoskeletal: Negative.    Skin: Negative.    Neurological: Negative.    Hematological: Negative.    Psychiatric/Behavioral: Negative.    All other systems reviewed and are negative.      Physical Exam    BP: (!) 165/107, Heart Rate: (!) 106, Temp: 98.2 F (36.8 C), Resp Rate: 20, SpO2: 100 %, Weight: 111.1 kg    Physical Exam   Constitutional: He is oriented to person, place, and time. He appears well-developed and well-nourished. No distress.   Cooperative.  Appears very comfortable for someone having 10/10 pain.     HENT:   Head: Normocephalic.   Mouth/Throat: Oropharynx is clear and moist.   Eyes: Pupils are equal, round, and reactive to light. Conjunctivae and EOM are normal.   Neck:  Normal range of motion. Neck supple.   Cardiovascular: Normal rate, regular rhythm and normal heart sounds.    No murmur heard.  Pulmonary/Chest: Effort normal and breath sounds normal.   Abdominal: Soft. Bowel sounds are normal. He exhibits no distension. There is no tenderness. There is no guarding.   Soft and benign     Musculoskeletal: Normal range of motion. He exhibits no edema.   Neurological: He is alert and oriented to person, place, and time.   Skin: No pallor.   Grafts in both arms, has left subclavian/ij line for dialysis purposes.     Psychiatric: He has a normal mood and affect. His behavior is normal.   Nursing note and vitals reviewed.    ekg - sinus tach, 102, no st elevation or depression    Results     Procedure Component Value Units Date/Time    Stool Culture [[id number]] Collected:  04/10/17 0542    Specimen:  Stool from Stool. Updated:  04/10/17 0622    C diff Toxin B Gene by DNA Amplification [[id number]] Collected:  04/10/17 0536    Specimen:  Stool from Stool. Updated:  04/10/17 0622    Troponin I (Stat) [[id number]]  (Abnormal) Collected:  04/10/17 0536    Specimen:  Plasma Updated:  04/10/17 0614     Troponin I 0.13 (H) ng/mL     Lactoferrin, Stool [[id number]] Collected:  04/10/17 0542    Specimen:  Stool from Stool. Updated:  04/10/17 0610     Lactoferrin, Qual Negative    CMP [[id number]]  (Abnormal) Collected:  04/10/17 0536    Specimen:  Plasma Updated:  04/10/17 0609     Sodium 143 mMol/L      Potassium 4.3 mMol/L      Chloride 103 mMol/L      CO2 25.0 mMol/L      Calcium 7.1 (L) mg/dL      Glucose 244 (H) mg/dL  Creatinine 7.40 (H) mg/dL      BUN 43 (H) mg/dL      Protein, Total 6.2 gm/dL      Albumin 2.9 (L) gm/dL      Alkaline Phosphatase 237 (H) U/L      ALT 16 U/L      AST (SGOT) 27 U/L      Bilirubin, Total 0.5 mg/dL      Albumin/Globulin Ratio 0.86 Ratio      Anion Gap 19.3 (H) mMol/L      BUN/Creatinine Ratio 5.8 (L) Ratio      EGFR 8 (L) mL/min/1.29m2      Osmolality Calc 297  mOsm/kg      Globulin 3.3 gm/dL     CBC [[id number]]  (Abnormal) Collected:  04/10/17 0536    Specimen:  Blood from Blood Updated:  04/10/17 0557     WBC 5.4 K/cmm      RBC 2.56 (L) M/cmm      Hemoglobin 7.9 (L) gm/dL      Hematocrit 40.9 (L) %      MCV 96 fL      MCH 31 pg      MCHC 32 gm/dL      RDW 81.1 (H) %      PLT CT 119 (L) K/cmm      MPV 7.1 fL      NEUTROPHIL % 78.0 %      Lymphocytes 7.7 (L) %      Monocytes 8.4 %      Eosinophils % 5.7 %      Basophils % 0.2 %      Neutrophils Absolute 4.2 K/cmm      Lymphocytes Absolute 0.4 (L) K/cmm      Monocytes Absolute 0.5 K/cmm      Eosinophils Absolute 0.3 K/cmm      BASO Absolute 0.0 K/cmm         Xr Chest Ap Portable    Result Date: 04/10/2017  Minimal pulmonary edema. ReadingStation:[id number]        MDM and ED Course   630 - Chronic renal failure, diarrhea that might be C. Diff with complaint of 10/10 chest pain without EKG change. He says he is allergic to aspirin.  He also says that he is allergic to morphine, can only take dilaudid for pain.  He says the nitro did not do anything for him and he is still having 10/10 pain but he does continue to appear very comfortable.  I have sent off for records from [facility], from which he was just discharged.  I will start Flagyl po for possible C. Diff.  With nitro not effective and no EKG change and troponin just 0.13 in a dialysis patient with a Creatinine>7, ACS certainly may nto be the source of his discomfort.  I feel it would be a risk for patient to be anticoagulated since he has been requiring transfusions every other week at his home dialysis for what he says is unexplained blood loss in his stool.  I have called to speak to hospitlaist Dr. Minus Liberty at [facility], no response yet.  I have given him a dose of dilaudid.    639 - I spoke with Dr. Minus Liberty at [facility], hospitalist, who has accepted patient for admission, he agrees with my plan, including not heparinizing at this time.        ED Medication Orders      Start Ordered     Status Ordering Provider  04/10/17 0636 04/10/17 0635  metroNIDAZOLE (FLAGYL) tablet 500 mg  Once in ED     Route: Oral  Ordered Dose: 500 mg     Acknowledged Nicolasa Ducking    04/10/17 1610 04/10/17 0625  ondansetron (ZOFRAN) injection 4 mg  Once in ED     Route: Intravenous  Ordered Dose: 4 mg     Last [facility] action:  Given Nicolasa Ducking    04/10/17 9604 04/10/17 0625  HYDROmorphone (DILAUDID) injection 0.5 mg  Once in ED     Route: Intravenous  Ordered Dose: 0.5 mg     Last [facility] action:  Given Nicolasa Ducking    04/10/17 0544 04/10/17 0543    Once in ED     Route: Oral  Ordered Dose: 324 mg     Discontinued Nicolasa Ducking    04/10/17 0537 04/10/17 0543  nitroglycerin (NITROSTAT) SL tablet 0.4 mg  Every 5 min PRN     Route: Sublingual  Ordered Dose: 0.4 mg     Last MAR action:  Given Maelyn Berrey, Al Corpus             MDM  Number of Diagnoses or Management Options  Acute chest pain:   Diarrhea, unspecified type:   Critical Care  Total time providing critical care: 30-74 minutes                   Procedures    Clinical Impression & Disposition     Clinical Impression  Final diagnoses:   Acute chest pain   Diarrhea, unspecified type        ED Disposition     ED Disposition Condition Date/Time Comment    [facility] Transfer to [facility] Apr 10, 2017  6:41 AM            New Prescriptions    No medications on file                 Nicolasa Ducking, MD  04/10/17 [phone]

## 2017-04-10 NOTE — ED Notes (Signed)
Bed: EDA12-A  Expected date:   Expected time:   Means of arrival:   Comments:

## 2017-04-10 NOTE — Plan of Care (Addendum)
Problem: Moderate/High Fall Risk Score >5  Goal: Patient will remain free of falls  Outcome: Progressing      Problem: Chest Pain  Goal: Vital signs and cardiac rhythm stable  Outcome: Progressing    Goal: Cardiac pain management  Outcome: Progressing    Goal: Anxiety management/effective coping  Outcome: Progressing    Goal: Patient/Patient Care Companion demonstrates understanding of disease process, treatment plan, medications, and discharge plan  Outcome: Progressing      Comments: 2115- Assumed pt care at 1900. Pt resting quietly in bed, side rails up *3, call bell within reach. Pt complaining of chest and abdominal pain rated 10/10. Pt appears comfortable, respirations even, face free of grimacing, no guarding observed. 0.5 mg iv dilaudid administered per MAR. Pt did not eat dinner when tray provided and requesting tray to be heated up in microwave. Explained to pt that I was unable to take his tray out of the room r/t his isolation for CDIFF and our infection control protocols. Pt became angry requesting to speak with charge nurse. Charge nurse to the bedside. Pt agreeable to tomato soup with non salted saltines, food provided. Pt refused to be I&O cathed for UA, refused heparin. Refused to answer questions for opiod risk assessment and discharge planning, pt states "I am not answering these questions." Refused bed alarm. Pt removed hat in the toilet for stool specimen collection. Placed hat back in the toilet and educated pt on the fact that a stool specimen is needed for additional cultures. Pt verbalized understanding and agrees to notify staff when he has a bm.     2135- Additional 0.5 mg iv dilaudid administered for continued chest/abdominal pain rated 10/10. Pt refuses nitro. States "I am not taking that. It does not work for me, all it does is give me a headache." Pt continues to appear comfortable, no signs of distress. Call bell within reach. Pt using call bell appropriately.     2220- Pt sitting at  the bedside without any oxygen on. Voiced my concerns regarding lack of O2 as the pt states he wears 4L NC at home. Pt states "I am fine, I wear it as needed." Nodding off while sitting at side of bed. Suggested laying in bed as pt was nodding off and snoring. Pt angrily stated "Now how am I nodding off when I am sitting here on my phone?!" continues to refuse bed alarm. O2 SATS 97% on RA. Denies/demonstrates no further needs at this time.     0000- Pt continues to rate chest/abdominal pain 10/10 and requesting the physician be called for more pain medicine. I again offer nitro for chest pressure and pt continues to refuse. Pt continues to appear comfortable, no signs of distress, vital signs stable. Dr. Minus Liberty called regarding pt's uncontrolled pain. MD suggested administering nitro for chest pain and stated iv dilaudid could be given 30 minutes early if pt is still experiencing uncontrolled pain. As I entered the doorway to inform the pt what the MD said pt was sleeping soundly. Awakened easily with verbal stimulation and updated on plan. Pt nodded that he understood with his eyes closed. Denies/demonstrates no further needs at this time.     0245- Pt made NPO for stress test later in a.m. Pt states "well can't I refuse the stress test?" Educated pt on the importance of the stress test r/t to his chest pain. Reminded the patient that he stated he had been experiencing chest pain rated 10/10 throughout the night and  his chest pain was one of the reasons for his hospital admission. Pt states "no I was hospitalized for my shortness of breath and diarrhea." Pt allowed me to take away beverages and be NPO. Refusing vitals, states vitals every 4 hours is "tedious" and "annoying."     0400- Pt sleeping soundly with CPAP in place. Respirations even, no signs of distress. Demonstrates no needs at this time. Call bell within reach.     0550- Pt resting quietly in bed, side rails up *3, call bell within reach. Complaining of  chest/abdominal pain rated 10/10 and "crampy hands." PRN dilaudid administered per MAR. Pt appears comfortable, respirations even, face free of grimacing, no guarding observed, playing on his phone. Pt states he has not had any bowel movements.     1610- Pt standing in his room playing on his phone. Used call bell to notify writer that he will be refusing his stress test this morning. Will report off to oncoming nurse.     0740- Pt agreeable to echo but continues to refuse stress test. Report given to oncoming nurse.

## 2017-04-10 NOTE — EDIE (Signed)
[name]?NOTIFICATION?04/10/2017 05:12?[name], Jorge?MRN: [id number]    This [facility] at the [name] Emergency Department   For more information visit: [email or web address]   Criteria met      High Utilization (6+ ED Visits/6 Mo.)    3+Facilities in 90 Days    Known Aliases  No known aliases.   Care Providers  Provider [name] Type Phone Fax Service Dates   [name]   Current      ED Care Guidelines  There are currently no ED Care Guidelines in [name] for this patient. Please check your facility's medical records system.    Security Events  No recent Security Events currently on file    Recent Emergency Department Visit Summary  Showing 20 most recent visits out of 86 in the past 3 months  Admit Date Facility [facility] Type Major Type Diagnoses or Chief Complaint   Apr 10, 2017 [name]. [place]. [place] Emergency  Emergency      Diarrhea      Shortness of Breath      Chest pain, unspecified      Diarrhea, unspecified      Apr 10, 2017 [name] H. [place]. [place] Emergency  Emergency  Chief Complaint: CHRONIC KIDNEY DISEASE, PULMONARY EDEMA    Apr 09, 2017 [name] Rosemarie Ax. [place] Emergency  Emergency     Apr 08, 2017 [facility] [place] Emergency  Emergency     Apr 08, 2017 [facility] Emergency  Emergency     Apr 08, 2017 [facility] [place] [place]. [place]. [place] Emergency  Emergency      Procedure and treatment not carried out because of patient's decision for unspecified reasons      Chest pain, unspecified      Apr 07, 2017 [name]. Rosemarie Ax. [place] Emergency  Emergency     Apr 06, 2017 [name] [place]. [place] Emergency  Emergency  Chief Complaint: PT STS SOB    Apr 06, 2017 [name] [place]. [place] Emergency  Emergency     Apr 05, 2017 [name] [place]. [place] Emergency  Emergency      chest pain, shortness of breath, abdomin      Abdominal Pain      Diarrhea      Chest Pain      Chest pain, unspecified      Apr 04, 2017 [name] Rosemarie Ax. [place] Emergency  Emergency     Apr 04, 2017 [name] Rosemarie Ax.  Emergency  Emergency      SOB      Shortness of Breath      Chronic kidney disease, unspecified      Heart failure, unspecified      Hypocalcemia      Essential (primary) hypertension      Abnormal levels of other serum enzymes      Apr 02, 2017 [facility] Rosemarie Ax.  Emergency  Emergency     Apr 01, 2017 [place] [facility]. Rosemarie Ax.  Emergency  Emergency      Abdominal Pain      Shortness of breath      Hypocalcemia      Diarrhea, unspecified      Anemia in chronic kidney disease  Generalized abdominal pain      Abnormal findings on diagnostic imaging of other specified body structures      Chronic kidney disease, unspecified      Mar 31, 2017 [facility] Curling. Staff. [place] Emergency  Emergency      Abdominal/Stomach Pain      Abdominal Pain      End stage renal disease      Dependence on renal dialysis      Anemia in chronic kidney disease      Noninfective gastroenteritis and colitis, unspecified      Mar 30, 2017 [name] Rosemarie Ax. [place] Emergency  Emergency     Mar 29, 2017 [facility] [place] [place] Emergency  Emergency     Mar 29, 2017 [name] Emergency and Outpatient [name] Rosemarie Ax. [place] Emergency  Emergency      Abdominal/Stomach Pain      Vomiting      Diarrhea      Diarrhea, unspecified      Essential (primary) hypertension      End stage renal disease      Opioid use, unspecified with unspecified opioid-induced disorder      Nausea      Hyperkalemia      Hypocalcemia      Mar 29, 2017 [name] [place] Rosemarie Ax. [place] Emergency  Emergency     Mar 26, 2017 [facility] [place] [place] Emergency  Emergency         Recent Inpatient Visit Summary  Showing 20  most recent visits out of 31 in the past 3 months  Admit Date Facility [facility] Type Major Type Diagnoses or Chief Complaint   Apr 10, 2017 [name] H. [place]. [place] [facility]  Inpatient  Chief Complaint: CHRONIC KIDNEY DISEASE, PULMONARY EDEMA    Apr 07, 2017 [name] Rosemarie Ax. [place] Telemetry  Inpatient      Shortness of breath      Enterocolitis due to Clostridium difficile, not specified as recurrent      Fluid overload, unspecified      Other chest pain      End stage renal disease      Apr 06, 2017 [name] [place]. [place] [facility]  Inpatient  Chief Complaint: ESRD DOE    Apr 05, 2017 [name] Rosemarie Ax. [place] Telemetry  Inpatient      End stage renal disease      Enterocolitis due to Clostridium difficile, not specified as recurrent      Diarrhea, unspecified      Chest pain, unspecified      Unspecified abdominal pain      Apr 03, 2017 [name] Rosemarie Ax. [place] Telemetry  Inpatient      Cerebral palsy, unspecified      Chronic kidney disease, unspecified      Shortness of breath      Heart failure, unspecified      Coronary angioplasty status      Fluid overload, unspecified      Procedure and treatment not carried out due to patient leaving prior to being seen by [name] care provider      Allergy status to narcotic agent status      Family history of ischemic heart disease and other diseases of the circulatory system      Nicotine dependence, unspecified, uncomplicated      Apr 01, 2017 [facility] Rosemarie Ax. [place] [facility]  Inpatient     Apr 01, 2017 [place] [facility]. Rosemarie Ax. [place] [facility]  Inpatient      Diarrhea, unspecified      Anemia in chronic kidney disease      Chronic kidney disease, unspecified      Hypocalcemia      Abnormal findings on diagnostic imaging of other specified body structures      Generalized abdominal pain      Mar 30, 2017 [name]  Rosemarie Ax. 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 [place]. [place] [facility]  Inpatient      Hypertensive urgency      Atherosclerotic heart disease of native coronary artery without angina pectoris      End stage renal disease      Shortness of breath      Other specified postprocedural states      Dependence on supplemental oxygen      Allergy status to other drugs, medicaments and biological substances status      Personal history of pneumonia (recurrent)      Nicotine dependence, unspecified, uncomplicated      Old myocardial infarction      Mar 21, 2017 [name] [place] Rosemarie Ax. [place] Inpatient  Inpatient      Hypokalemia      Secondary hyperparathyroidism of renal origin      Other fatigue      Diarrhea, unspecified      Hyperkalemia      Heart failure, unspecified      Acute kidney failure, unspecified      End stage renal disease      Other ascites      Unspecified abdominal pain      Mar 20, 2017 [name]. [place]. [place] [facility]  Inpatient      Other ascites      Unspecified abdominal pain      Anemia, unspecified      Fever, unspecified      Hypocalcemia      Chest pain, unspecified      Chest Pain      Hyperkalemia      Abdominal Pain      Diarrhea      Mar 19, 2017 [name]. Rosemarie Ax. [place] [facility]  Inpatient     Mar 18, 2017 [place] [facility]. Rosemarie Ax. [place] [facility]  Inpatient      Chest pain, unspecified      Unspecified  abdominal pain      Hypocalcemia      Acute pulmonary edema      Chronic kidney disease, unspecified      Abnormal levels of other serum enzymes  Mar 07, 2017 [facility] [place] [facility]. [place]. [place] [facility]  Inpatient      Diarrhea, unspecified      Chest pain, unspecified      End stage renal disease      Dependence on renal dialysis      Mar 05, 2017 [facility] Rosemarie Ax. [place] Telemetry  Inpatient      Acute kidney failure, unspecified      Unspecified asthma, uncomplicated      Shortness of breath      Hypertensive chronic kidney disease with stage 5 chronic kidney disease or end stage renal disease      Patient's noncompliance with renal dialysis      Hypocalcemia      Anemia in chronic kidney disease      Noninfective gastroenteritis and colitis, unspecified      Allergy status to other drugs, medicaments and biological substances status      Thrombocytopenia, unspecified      Mar 04, 2017 [name] Rosemarie Ax. [place] Inpatient  Inpatient      Nausea with vomiting, unspecified      Hypoglycemia, unspecified      Patient's noncompliance with renal dialysis      Dependence on renal dialysis      Diarrhea, unspecified      Nicotine dependence, unspecified, uncomplicated      End stage renal disease      Hypertensive chronic kidney disease with stage 5 chronic kidney disease or end stage renal disease      Hypocalcemia      Tobacco abuse counseling      Mar 02, 2017 [facility]. [place]. [place] [facility]  Inpatient      Dependence on renal dialysis      Chronic obstructive pulmonary disease, unspecified      Hypertensive urgency      Hypertensive chronic kidney disease with stage 5 chronic kidney disease or end stage renal disease      Nicotine dependence, unspecified, uncomplicated      Anemia in chronic kidney disease      End stage renal disease      Atherosclerotic heart disease of native coronary artery without angina pectoris      Enterocolitis due to  Clostridium difficile, not specified as recurrent      Hyperkalemia      Mar 01, 2017 [facility] Rosemarie Ax. 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 Alexa. [place] [facility]  Inpatient      Hypertensive urgency      Chest pain, unspecified      Diarrhea, unspecified      Feb 25, 2017 [name] Rosemarie Ax. [place] Telemetry  Inpatient      Unspecified abdominal pain      Other specified postprocedural states      Allergy status to other drugs, medicaments and biological substances status  Personal history of urinary calculi      Essential (primary) hypertension      Hypertensive chronic kidney disease with stage 5 chronic kidney disease or end stage renal disease      Family history of ischemic heart disease and other diseases of the circulatory system      Nicotine dependence, cigarettes, in remission      Patient's other noncompliance with medication regimen      Atherosclerotic heart disease of native coronary artery without angina pectoris          E.D. Visit Count (12 mo.)  Facility Visits   [facility] 7   [name] 2   [place] [name] 1   [place] [name] 1   [facility] 1   [name] 2   [name] 1   [name] 7   [name] 2   [facility] [address]   [facility] 1   [name] 1   [facility] 1   [name] 6   [facility] 1   [facility] [place] [name] 32   [facility] [name] Emergency and Outpatient [name] 4   [name] 1   [name] [place] [name] 6   [name] [place] 5   J.WEliezer Champagne 2   [place] [facility] 2   [facility] - [facility] [facility] 3   [name] 1   [name] 3   [name] 2   [facility] [place] 2   [name] 5   [facility] 4   [name] 2   [name] ([place]) 2   [name] 1   [name] 1   [facility] - [facility] 1   [facility] 1   [name] 2   [name] - [name] [address] [facility] [place] [name] 4   [name] 2   [facility] 3   [facility] 1   [name] 1   [name] 1   [place] [name] 1   [place] [name] 1   [name] 17   [facility] - [name] 2   [name] 3   [facility] 14   [name] [place] 1   [name] 2   [place] [name] 1   [facility] 1   [facility] - [name] 1   [place] [name] 1   [name] 1   Total 209   Note: Visits indicate total known visits.        Prescription Monitoring Program  000??- Narcotic Use Score  000??- Sedative Use Score  000??- Stimulant Use Score  - All Scores range from 000-999 with 75% of the population scoring < 200 and on 1% scoring above 650  - The last digit of the narcotic, sedative, and  stimulant score indicates the number of active prescriptions of that type  - Higher Use scores correlate with  increased prescribers, pharmacies, mg equiv, and overlapping prescriptions   Concerning or unexpectedly high scores should prompt a review of the PMP record; this does not constitute checking PMP for prescribing purposes.    The above information is provided for the sole purpose of patient treatment. Use of this information beyond the terms of Data Sharing Memorandum of Understanding and License Agreement is prohibited. In certain cases not all visits may be represented. Consult the aforementioned facilities for additional information.   ? 2018 [employer] - [place], [place] - [email or web address]

## 2017-04-10 NOTE — ED Notes (Signed)
Blood specimens collected while placing IV.  Specimens labeled at bedside after verifying name, DOB and armband.  Specimens sent to lab by author.

## 2017-04-10 NOTE — ED Triage Notes (Signed)
Patient presented to the ED for c/o diarrhea which caused  Stomach pain. Then, shortness of breath at home which then caused chest pain.  Patient states he uses 4 liters of oxygen at home and in the car. States the diarrhea started "last Wednesday when I was at [facility] Patient states he was placed on oral vancomycin and flagyl which "last doses ended yesterday".  Patient states he is dialyzed every Monday - Wednesday-Friday with last dialysis on Friday  Which was the 21st. Patient states that he did not take his home medications for his blood pressure due to "feelings of nausea ". Last doses of medication as stated by patient were taken on Friday April 08, 2017. Patient has urgency on arrival to the ED with need to have a bowel movement. Patient states he is having "diarrhea".

## 2017-04-10 NOTE — H&P (Signed)
ADMISSION HISTORY AND PHYSICAL EXAM    Date Time: 04/10/17 10:30 AM  Patient Name: [name]  Attending Physician: Ilda Mori, Virna Livengood  Primary Care Physician: Christa See, Captain Blucher    CC: transferred from outside facility for higher level of care for SOB , Chest pain   And increasing diarrhea .      History of Presenting Illness:   [name] is a 46 y.o. male from [place] AV was viting his father in this area ;  PMH of Essential HTN , ESRD with chronic anemia with base line Hb ~7.5 on HD; MWF , CAD s/p  2 stents placed post MI in 2010 at [facility] was transferred   from outside facility for higher level of care for SOB   And increasing diarrhea .  Pt reports that he has had increasing diarrhea for one week, and he says it same as the C.Diff that he has had in the past  , he stopped flagyl 2 weeks ago for a bout of C.Diff.   He says he was just discharged from a hospital in [place].    He reports that he has history of MI with stents in 2010 developed chest pressure "like something sitting on my chest" along with shortness of breath prior to reporting to [facility] Emergency Department .    He has also had some abdominal cramping. He gets dialysis M,W,F and he did have dialysis on Friday; 12.21.18   He stated that  He got one unit of PRBC on 12.21.18 after HD  B/o low hb of 6.8   which he says he gets every other weeks for unexplained GI blood loss.    No leg pain or swelling, no palpitations or syncope/pre-syncope.    He feels like he is not improving from CDAD despite completing 14 day course of po vanco and flagyl and  has passed a large diarrheal stool today .    + abd swelling   SOB and chest pressure   And gen body ache   No fever or chills   No cough   No acute focal deficit .    Old chart reviewed on care everywhere :  CTA Chest: and abd  done on 12.2.18  Pulmonary arteries are adequately opacified without acute or chronic filling defects. The thoracic aorta is normal in course and caliber without  dissection or aneurysm.   The heart size is enlarged. There is a small pericardial effusion. Thoracic lymph nodes are not enlarged. Vascular calcifications are seen.  Bilateral pleural effusions are identified with bibasilar atelectasis, left greater than right. There is no pleural thickening or pneumothorax. The airways are patent.  Abdomen:  A small hiatal hernia is seen. Hepatic steatosis is noted. The pancreas, spleen, adrenal glands, and kidneys otherwise demonstrate no acute pathology. Bilateral renal atrophy is identified.  Soft tissue anasarca is seen. There is no free air or lymph node enlargement. Abdominal aorta is not aneurysmal.  Pelvis:  There is a large amount of abdominopelvic ascites. Scattered colonic diverticuli are seen. Postoperative changes are seen following ventral hernia repair. There is no bowel wall thickening or obstruction. There is no free fluid. Lymph nodes are not   enlarged. Urinary bladder is unremarkable.  Skeleton:   There are no acute fractures. No suspicious bony lesions.    CMP done on admission: BUN/creatinine 47/7.56  Alkaline phosphatase 254  Normal AST ALT  Normal sodium potassium and bicarbonate  Venous glucose of 89.  CBC: WBC 6.5, H&H  8.3/25.1, platelet count 145.  First troponin 0.13.  EKG findings:  Sinus tachycardia   Probable left atrial enlargement   Borderline prolonged QT interval   Baseline wander in lead(s) V2   No previous ECG available for comparison   QTc 493     Admission orders were placed.          Past Medical History:     Past Medical History:   Diagnosis Date   . Asthma    . Coronary artery disease    . Heart attack 2010    at [facility] , had 2 stents placed post MI,   . Hypertension    . Kidney failure 2008    on renal since 2008   . Sleep apnea     uses sleep apnea for SOB       Past Surgical History:     Past Surgical History:   Procedure Laterality Date   . ABDOMINAL SURGERY     . biopsey      Liver biopsey 2017 checking for cancer and pt. states it was  negative   . INGUINAL HERNIA REPAIR Right 2016    [facility]   . pericentesis      every two weeks starting  january 2018; Pt. states they take off 4- 8 liters off. Next due this week.   Marland Kitchen REPAIR, UMBILICAL HERNIA  2016    [facility]       Family History:     Family History   Problem Relation Age of Onset   . Hypertension Mother    . Heart attack Maternal Grandmother    . Arthritis Paternal Grandfather        Social History:     Social History     Social History   . Marital status: Divorced     Spouse name: N/A   . Number of children: N/A   . Years of education: N/A     Occupational History   . Not on file.     Social History Main Topics   . Smoking status: Current Every Day Smoker     Packs/day: 0.25     Years: 20.00     Types: Cigarettes   . Smokeless tobacco: Never Used   . Alcohol use No   . Drug use: No   . Sexual activity: Not on file     Other Topics Concern   . Not on file     Social History Narrative   . No narrative on file       Allergies:     Allergies   Allergen Reactions   . Lisinopril Anaphylaxis   . Morphine Anaphylaxis     Quite breathing   . Toradol [Ketorolac Tromethamine] Anaphylaxis       Medications:     Current Facility-Administered Medications:   .  albuterol-ipratropium (DUO-NEB) 2.5-0.5(3) mg/3 mL nebulizer 3 mL, 3 mL, Nebulization, Q6H SCH, Minus Liberty, Pairlee Sawtell Ashfiqur, Tranesha Lessner  .  amLODIPine (NORVASC) tablet 10 mg, 10 mg, Oral, Daily, Minus Liberty, Reyes Fifield Ashfiqur, Keneth Borg  .  carvedilol (COREG) tablet 12.5 mg, 12.5 mg, Oral, Q12H SCH, Minus Liberty, Lorrie Gargan Ashfiqur, Nhu Glasby  .  Melene Muller ON 04/11/2017] docusate sodium (COLACE) capsule 100 mg, 100 mg, Oral, Daily, Minus Liberty, Rashada Klontz Ashfiqur, Imad Shostak  .  heparin (porcine) (Heparin Sodium (Porcine)) injection 5,000 Units, 5,000 Units, Subcutaneous, Q8H SCH, Minus Liberty, Lauralyn Shadowens Ashfiqur, Jru Pense  .  hydrALAZINE (APRESOLINE) tablet 100 mg, 100 mg, Oral, TID, Minus Liberty, Paizlie Klaus Ashfiqur, Onya Eutsler  .  naloxone [facility]) injection  0.4 mg, 0.4 mg, Intravenous, PRN, Minus Liberty, Orit Sanville Ashfiqur, Mirha Brucato  .  nitroglycerin (NITROSTAT) SL tablet 0.4 mg, 0.4 mg,  Sublingual, Q5 Min PRN, Minus Liberty, Normon Pettijohn Ashfiqur, Brandalynn Ofallon  .  senna-docusate (PERICOLACE) 8.6-50 MG per tablet 2 tablet, 2 tablet, Oral, QHS, Sama Arauz Ashfiqur, Janin Kozlowski  .  sevelamer (RENAGEL) tablet 800 mg, 800 mg, Oral, TID MEALS, Lynora Dymond Ashfiqur, Shaynah Hund  .  sodium chloride (PF) 0.9 % injection 3 mL, 3 mL, Intravenous, Q8H, Minus Liberty, Chetara Kropp Ashfiqur, Everley Evora     Review of Systems:   All other systems were reviewed and negative except as noted above in HPI.      Physical Exam:   BP 146/86   Pulse 95   Temp 97.5 F (36.4 C) (Oral)   Resp 18    There is no height or weight on file to calculate BMI.    Constitutional: awake, alert, oriented x 3; no acute distress.  Left chest perma cath   AVF rt UE   And left forearm and arm   Eyes: perrla, eomi, sclera anicteric   ENT: oropharynx clear without lesions, mucous membranes moist  Neck: supple, no lymphadenopathy, no thyromegaly, no JVD, no carotid bruits  Cardiovascular: regular rate and rhythm, no murmurs, rubs or gallops  Lungs: clear to auscultation bilaterally, without wheezing, rhonchi, or rales  Abdomen: soft, tender and distended; no palpable masses, , normoactive bowel sounds, no rebound or guarding  Extremities:++edema    Neuro: cranial nerves grossly intact, strength 5/5 in upper and lower extremities sensation intact,   Skin: no rashes or lesions noted  Psychiatric: Awake, alert, orientedx3    Labs:     Results     Procedure Component Value Units Date/Time    CBC and differential [[id number]]  (Abnormal) Collected:  04/10/17 1009    Specimen:  Blood from Blood Updated:  04/10/17 1021     WBC 6.5 K/cmm      RBC 2.64 (L) M/cmm      Hemoglobin 8.3 (L) gm/dL      Hematocrit 56.2 (L) %      MCV 95 fL      MCH 32 pg      MCHC 33 gm/dL      RDW 13.0 (H) %      PLT CT 145 K/cmm      MPV 7.6 fL      NEUTROPHIL % 76.8 %      Lymphocytes 9.3 (L) %      Monocytes 8.5 %      Eosinophils % 5.0 %      Basophils % 0.4 %      Neutrophils Absolute 5.0 K/cmm      Lymphocytes Absolute 0.6 K/cmm       Monocytes Absolute 0.6 K/cmm      Eosinophils Absolute 0.3 K/cmm      BASO Absolute 0.0 K/cmm     Troponin I [[id number]] Collected:  04/10/17 1009    Specimen:  Plasma Updated:  04/10/17 1018    Comprehensive metabolic panel [[id number]] Collected:  04/10/17 1009    Specimen:  Plasma Updated:  04/10/17 1017          Imaging:     Radiology Results (24 Hour)     ** No results found for the last 24 hours. **        EKG findings:  Sinus tachycardia   Probable left atrial enlargement   Borderline prolonged QT interval   Baseline wander in lead(s) V2  No previous ECG available for comparison   QTc 493       Assessment / Plan:     Chest pain, chest tightness and SOB in context of history of CAD and status post stent placed in 2010   Admitted to telemetry  Twelve-lead EKG  Trend troponins  Discussed with cardiologist  Nuclear stress test tomorrow if troponin remains flat.  Transthoracic echocardiogram ordered  Heparin drip was not initiated because patient has unexplained blood loss requiring blood transfusion every other week after dialysis as per patient.  Continue beta blocker  Continue statin  Patient had anaphylaxis with NSAIDs  We'll defer antiplatelet management to cardiologist.    End-stage renal disease on dialysis  MWF via left chest permacath  Nonfunctioning AV fistula in the right arm and left forearm and left arm  On dialysis Monday Wednesday Friday  Last  dialysis on last Friday.  Case discussed with Dr. Lajean Saver: Nephrologist.  Electrolyte imbalance  Mild fluid overload her next or chest  Patient is asking for extra dialysis today.    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    Recent history of Clostridium difficile associated diarrhea  Completed 14 day course of oral vancomycin and Flagyl  Having recurrent vomiting and his diarrhea  Started on Dificid   Recheck C. difficile stool PCR  Contact isolation  X-ray abdomen to rule out toxic megacolon  Clear liquid diet; advance as tolerated  Recent CT  abdomen pelvis done on 03/20/17    Chronic anemia  As per patient he requires blood transfusion every other week with dialysis  Chart review showed his baseline hemoglobin is around 7.5  As per patient he had hemoglobin of 6.8 on 04/08/17 and got 1 unit of transfusion after dialysis  Check Hemoccult test.  Added Protonix.  Daily monitoring of H&H.  Blood transfusion if hemoglobin is less than 7.      Essential hypertension  Uncontrolled on admsison   Resume home meds   Additional PRn hydralazine.      OSA   CPAP at night .  O2 as needed to keep Spo2 >91%      CODE STATUS: Full code  No emergency contact documented.    DVT prophylaxis: Currently on not on chemical DVT prophylaxis because of low H&H  Reported unexplained blood loss by the patient    Disposition: Admit to telemetry 2.  Cardiology, nephrology consult.  X-ray abdomen.        Signed by: Marche Hottenstein Otho Ket, Izayah Miner - [id number]  cc:Pcp, Octaviano Glow, Jafeth Mustin

## 2017-04-11 ENCOUNTER — Inpatient Hospital Stay: Payer: Medicare Other

## 2017-04-11 DIAGNOSIS — I25110 Atherosclerotic heart disease of native coronary artery with unstable angina pectoris: Principal | ICD-10-CM

## 2017-04-11 LAB — COMPREHENSIVE METABOLIC PANEL
ALT: 14 U/L (ref 0–55)
AST (SGOT): 24 U/L (ref 10–42)
Albumin/Globulin Ratio: 0.78 Ratio (ref 0.70–1.50)
Albumin: 2.9 gm/dL — ABNORMAL LOW (ref 3.5–5.0)
Alkaline Phosphatase: 242 U/L — ABNORMAL HIGH (ref 40–145)
Anion Gap: 18.7 mMol/L — ABNORMAL HIGH (ref 7.0–18.0)
BUN / Creatinine Ratio: 6 Ratio — ABNORMAL LOW (ref 10.0–30.0)
BUN: 50 mg/dL — ABNORMAL HIGH (ref 7–22)
Bilirubin, Total: 0.7 mg/dL (ref 0.1–1.2)
CO2: 22.2 mMol/L (ref 20.0–30.0)
Calcium: 7.2 mg/dL — ABNORMAL LOW (ref 8.5–10.5)
Chloride: 104 mMol/L (ref 98–110)
Creatinine: 8.27 mg/dL — ABNORMAL HIGH (ref 0.80–1.30)
EGFR: 8 mL/min/{1.73_m2} — ABNORMAL LOW (ref 60–150)
Globulin: 3.7 gm/dL (ref 2.0–4.0)
Glucose: 74 mg/dL (ref 71–99)
Osmolality Calc: 291 mOsm/kg (ref 275–300)
Potassium: 4.9 mMol/L (ref 3.5–5.3)
Protein, Total: 6.6 gm/dL (ref 6.0–8.3)
Sodium: 140 mMol/L (ref 136–147)

## 2017-04-11 LAB — CBC
Hematocrit: 24 % — ABNORMAL LOW (ref 39.0–52.5)
Hemoglobin: 7.8 gm/dL — ABNORMAL LOW (ref 13.0–17.5)
MCH: 32 pg (ref 28–35)
MCHC: 33 gm/dL (ref 32–36)
MCV: 97 fL (ref 80–100)
MPV: 7.5 fL (ref 6.0–10.0)
PLT CT: 132 10*3/uL (ref 130–440)
RBC: 2.48 10*6/uL — ABNORMAL LOW (ref 4.00–5.70)
RDW: 14.6 % — ABNORMAL HIGH (ref 11.0–14.0)
WBC: 5.4 10*3/uL (ref 4.0–11.0)

## 2017-04-11 LAB — CBC AND DIFFERENTIAL
Basophils %: 0.5 % (ref 0.0–3.0)
Basophils Absolute: 0 10*3/uL (ref 0.0–0.3)
Eosinophils %: 7.9 % — ABNORMAL HIGH (ref 0.0–7.0)
Eosinophils Absolute: 0.4 10*3/uL (ref 0.0–0.8)
Hematocrit: 24.3 % — ABNORMAL LOW (ref 39.0–52.5)
Hemoglobin: 7.8 gm/dL — ABNORMAL LOW (ref 13.0–17.5)
Lymphocytes Absolute: 0.5 10*3/uL — ABNORMAL LOW (ref 0.6–5.1)
Lymphocytes: 10 % — ABNORMAL LOW (ref 15.0–46.0)
MCH: 31 pg (ref 28–35)
MCHC: 32 gm/dL (ref 32–36)
MCV: 96 fL (ref 80–100)
MPV: 7 fL (ref 6.0–10.0)
Monocytes Absolute: 0.4 10*3/uL (ref 0.1–1.7)
Monocytes: 8.6 % (ref 3.0–15.0)
Neutrophils %: 73 % (ref 42.0–78.0)
Neutrophils Absolute: 3.7 10*3/uL (ref 1.7–8.6)
PLT CT: 130 10*3/uL (ref 130–440)
RBC: 2.53 10*6/uL — ABNORMAL LOW (ref 4.00–5.70)
RDW: 14.6 % — ABNORMAL HIGH (ref 11.0–14.0)
WBC: 5.1 10*3/uL (ref 4.0–11.0)

## 2017-04-11 LAB — BASIC METABOLIC PANEL
Anion Gap: 17.1 mMol/L (ref 7.0–18.0)
BUN / Creatinine Ratio: 6.5 Ratio — ABNORMAL LOW (ref 10.0–30.0)
BUN: 53 mg/dL — ABNORMAL HIGH (ref 7–22)
CO2: 23.8 mMol/L (ref 20.0–30.0)
Calcium: 7.2 mg/dL — ABNORMAL LOW (ref 8.5–10.5)
Chloride: 105 mMol/L (ref 98–110)
Creatinine: 8.16 mg/dL — ABNORMAL HIGH (ref 0.80–1.30)
EGFR: 8 mL/min/{1.73_m2} — ABNORMAL LOW (ref 60–150)
Glucose: 71 mg/dL (ref 71–99)
Osmolality Calc: 294 mOsm/kg (ref 275–300)
Potassium: 4.9 mMol/L (ref 3.5–5.3)
Sodium: 141 mMol/L (ref 136–147)

## 2017-04-11 LAB — FERRITIN: Ferritin: 2541.98 ng/mL — ABNORMAL HIGH (ref 21.80–274.60)

## 2017-04-11 LAB — IRON PROFILE
% Saturation: 37 % (ref 15–50)
Iron: 97 ug/dL (ref 50.0–175.0)
TIBC: 265 ug/dL (ref 250–450)
Transferrin: 189 mg/dL (ref 174.0–364.0)

## 2017-04-11 LAB — PHOSPHORUS: Phosphorus: 4.6 mg/dL (ref 2.3–4.7)

## 2017-04-11 LAB — MAGNESIUM: Magnesium: 1.7 mg/dL (ref 1.6–2.6)

## 2017-04-11 LAB — HEPATITIS B SURFACE ANTIGEN W/ REFLEX TO CONFIRMATION: Hepatitis B Surface Antigen: NONREACTIVE

## 2017-04-11 LAB — HEPATITIS C ANTIBODY: Hepatitis C Antibody: NONREACTIVE

## 2017-04-11 LAB — PT/INR
PT INR: 1.1 (ref 0.5–1.3)
PT: 10.7 s (ref 9.5–11.5)

## 2017-04-11 LAB — CALCIUM, IONIZED: Calcium, Ionized: 3.33 mg/dL — ABNORMAL LOW (ref 4.35–5.10)

## 2017-04-11 LAB — VITAMIN B12 AND FOLATE
Folate: 5.7 ng/mL — ABNORMAL LOW (ref 7.0–19.9)
Vitamin B-12: 319 pg/mL (ref 213–816)

## 2017-04-11 LAB — HEPATITIS B CORE ANTIBODY, IGM: Hepatitis B Core IgM: NONREACTIVE

## 2017-04-11 MED ORDER — FOLIC ACID 1 MG PO TABS
1.00 mg | ORAL_TABLET | Freq: Every day | ORAL | Status: DC
Start: 2017-04-11 — End: 2017-04-13
  Administered 2017-04-11 – 2017-04-13 (×2): 1 mg via ORAL
  Filled 2017-04-11 (×3): qty 1

## 2017-04-11 MED ORDER — ALBUTEROL SULFATE HFA 108 (90 BASE) MCG/ACT IN AERS
2.00 | INHALATION_SPRAY | Freq: Four times a day (QID) | RESPIRATORY_TRACT | Status: DC | PRN
Start: 2017-04-11 — End: 2017-04-13
  Administered 2017-04-11: 23:00:00 2 via RESPIRATORY_TRACT
  Filled 2017-04-11: qty 1

## 2017-04-11 MED ORDER — HEPARIN SODIUM (PORCINE) 1000 UNIT/ML IJ SOLN
INTRAMUSCULAR | Status: AC
Start: 2017-04-11 — End: ?
  Filled 2017-04-11: qty 10

## 2017-04-11 MED ORDER — ALBUTEROL-IPRATROPIUM 2.5-0.5 (3) MG/3ML IN SOLN
3.00 mL | Freq: Four times a day (QID) | RESPIRATORY_TRACT | Status: DC | PRN
Start: 2017-04-11 — End: 2017-04-13

## 2017-04-11 MED ORDER — HYDRALAZINE HCL 20 MG/ML IJ SOLN
10.00 mg | Freq: Four times a day (QID) | INTRAMUSCULAR | Status: DC | PRN
Start: 2017-04-11 — End: 2017-04-13
  Administered 2017-04-11: 22:00:00 10 mg via INTRAVENOUS
  Filled 2017-04-11: qty 1

## 2017-04-11 MED ORDER — NEPHRO-VITE RX 1 MG PO TABS
1.00 | ORAL_TABLET | Freq: Every day | ORAL | Status: DC
Start: 2017-04-11 — End: 2017-04-13
  Administered 2017-04-11 – 2017-04-13 (×2): 1 mg via ORAL
  Filled 2017-04-11 (×3): qty 1

## 2017-04-11 MED ORDER — EPOETIN ALFA 10000 UNIT/ML IJ SOLN
6000.00 [IU] | INTRAMUSCULAR | Status: DC
Start: 2017-04-11 — End: 2017-04-13
  Administered 2017-04-11 – 2017-04-13 (×2): 6000 [IU] via INTRAVENOUS
  Filled 2017-04-11 (×2): qty 1

## 2017-04-11 MED ORDER — CLONIDINE HCL 0.1 MG PO TABS
0.10 mg | ORAL_TABLET | Freq: Three times a day (TID) | ORAL | Status: DC | PRN
Start: 2017-04-11 — End: 2017-04-11

## 2017-04-11 MED ORDER — LIDOCAINE 5 % EX PTCH
1.00 | MEDICATED_PATCH | CUTANEOUS | Status: DC
Start: 2017-04-11 — End: 2017-04-11
  Filled 2017-04-11: qty 1

## 2017-04-11 NOTE — Progress Notes (Signed)
Patient down for echocardiogram.  Patient adamantly refusing to do stress test.    [place] Iolanda Folson, MS Exercise Physiologist  [phone]

## 2017-04-11 NOTE — Plan of Care (Signed)
Problem: Chest Pain  Goal: Vital signs and cardiac rhythm stable  Outcome: Progressing   04/11/17 0846   Goal/Interventions addressed this shift   Vital signs and cardiac rhythm stable Monitor [name] vital signs/cardiac rhythms;Monitor labs;Assess the need for oxygen therapy and administer as ordered

## 2017-04-11 NOTE — Progress Notes (Signed)
Renal Progress Note    Follow up for:  ESRD    Subjective:     Seen on dialysis  No distress but is requesting additional HD 12/25 based upon somewhat vague symptoms surrounding generalized fatigue  Still awaiting records from patient's previous outpatient dialysis facility    ROS negative for: SOB, CP, F/C, N/V/D, pruritis.    Objective:     Vitals: BP (!) 141/93   Pulse (!) 103   Temp 97.5 F (36.4 C)   Resp 20   Ht 1.854 m (6\' 1" )   Wt 112.7 kg (248 lb 7.3 oz)   SpO2 98%   BMI 32.78 kg/m       I/O:   Intake/Output Summary (Last 24 hours) at 04/11/17 1728  Last data filed at 04/11/17 1030   Gross per 24 hour   Intake              480 ml   Output             1000 ml   Net             -520 ml     Weights:  Wt Readings from Last 1 Encounters:   04/11/17 0603 112.7 kg (248 lb 7.3 oz)       Physical Exam:  General: No distress.  Lungs: CTA  Heart: RRR, no murmur, gallop, rub, or heave.  Abdomen: BS present, soft, NT/ND, no organomegaly.  Extremities: No edema.  Access: Left IJ PC site clean and non-tender      Dialysis Treatment Data:  Blood flow rate: 420 mL/min  Dialysate flow rate: 600 mL/min  Ultrafiltration rate: 1180 mL/hr    Dialysate potassium:    Dialysate calcium:      Arterial pressure: 180 mmHg  Venous pressure:      Labs:  Labs reviewed.  Radiology results reviewed.  Pertinent findings below.  Recent Labs      04/11/17   0446  04/10/17   1009   WBC  5.4  5.1  6.5   Hemoglobin  7.8*  7.8*  8.3*   Hematocrit  24.0*  24.3*  25.1*   PLT CT  132  130  145     Recent Labs      04/11/17   0446   PT  10.7   PT INR  1.1     Recent Labs      04/10/17   2038  04/10/17   1613  04/10/17   1009   Troponin I  0.11*  0.13*  0.14*     Recent Labs      04/10/17   1009   B-Natriuretic Peptide  3,149.4*           No results found.   Recent Labs      04/11/17   0446  04/10/17   1009  04/10/17   0536   Glucose  74  71  89  118*   Sodium  140  141  140  143   Potassium  4.9  4.9  4.6  4.3   Chloride  104   105  103  103   CO2  22.2  23.8  22.3  25.0   BUN  50*  53*  47*  43*   Creatinine  8.27*  8.16*  7.56*  7.40*   EGFR  8*  8*  9*  8*   Calcium  7.2*  7.2*  7.5*  7.1*  Recent Labs      04/11/17   0446   Magnesium  1.7   Phosphorus  4.6     Recent Labs      04/11/17   0446  04/10/17   1009  04/10/17   0536   Albumin  2.9*  3.0*  2.9*   Protein, Total  6.6  6.8  6.2   Bilirubin, Total  0.7  0.7  0.5   Alkaline Phosphatase  242*  254*  237*   ALT  14  15  16    AST (SGOT)  24  26  27     No results for input(s): SGUR, LABPH, PROTEINUR, GLUUA, KETONESUA, BILIUA, BLOODUA, NITRITEUA, UROBILIUA, LEUA, NITRITEUA, WBCUA, RBCUA, BACTERIAUA in the last 72 hours.    Invalid input(s):  AMORPHOUSUA               Assessment:     1. ESRD secondary to HTN on maintenance HD q MWF; previously in [place] but has moved to [place] area without having arranged transfer of care or securing outpatient dialysis locally.  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 SHPTH  4. Exhausted upper extremity vascular access sites  5. Essential HTN  6. Diarrhea, etiology to be determined  7. Hx of CAD  8. Hx of asthma  9. OSA  10. Hx of recurrent C diff colitis  11. Hx of recurrent ascites (from care everywhere review)      Plan:      Stable on HD thus far today. Continue as ordered.   Unable to confirm his previous dialysis regimen and target weight; awaiting outpatient records.     Will resume usual EPO and activated vitamin D once his current doses can be confirmed with the outpatient unit.     I do not anticipate a clinical need for HD 12/25 but will reassess for such as per patient's request.      Pt will need to secure a local outpatient chair before he can be cleared for discharge given his intention not to return to the [place] area.     Cardiac evaluation as per primary and cardiology teams.    Case manager will need to work on getting him transferred from his home center in [place] to the [facility] center which is his preference.         Orders and Medications reviewed in Epic.  Case discussed with RN.    Cheral Marker, Pager [phone]  5:28 PM  04/11/2017  [facility]  Dr. Kai Levins, Dr. Vallery Sa, Dr. Lajean Saver  Office: [phone]

## 2017-04-11 NOTE — UM Notes (Signed)
[facility] Utilization Management Review Sheet    Facility :  [facility]    NAME: [name]  MR#: [id number]    CSN#: [id number]     DOB: 12/01/1969     ROOM: 332/332-A AGE: 46 y.o.    ADMIT DATE AND TIME: 04/10/2017  8:47 AM      PATIENT CLASS: Inpatient 04/10/2017 @ 0954    ATTENDING PHYSICIAN: Ilda Mori, MD  PAYOR:Payor: /       AUTH #:     DIAGNOSIS:   Unstable angina I20.0       HISTORY:   Past Medical History:   Diagnosis Date   . Asthma    . Coronary artery disease    . Heart attack 2010    at [facility] , had 2 stents placed post MI,   . Hypertension    . Kidney failure 2008    on renal since 2008   . Sleep apnea     uses sleep apnea for SOB     "[name] is a 46 y.o. male from [place] AV was viting his father in this area ;  PMH of Essential HTN , ESRD with chronic anemia with base line Hb ~7.5 on HD; MWF , CAD s/p  2 stents placed post MI in 2010 at [facility] ,OSA was transferred   from outside facility for higher level of care for SOB   And increasing diarrhea .  Pt reports that he has had increasing diarrhea for one week, and he says it same as the C.Diff that he has had in the past  , he stopped flagyl 2 weeks ago for a bout of C.Diff.   He says he was just discharged from a hospital in [place].   He reports that he has history of MI with stents in 2010 developed chest pressure "like something sitting on my chest" along with shortness of breath prior to reporting to [facility] .   He has also had some abdominal cramping. He gets dialysis M,W,F and he did have dialysis on Friday; 12.21.18   He stated that  He got one unit of PRBC on 12.21.18 after HD  B/o low hb of 6.8   which he says he gets every other weeks for unexplained GI blood loss.   No leg pain or swelling, no palpitations or syncope/pre-syncope.   He feels like he is not improving from CDAD despite completing 14 day course of po vanco and flagyl and  has passed a large diarrheal stool today .    + abd  swelling   SOB and chest pressure   And gen body ache   No fever or chills   No cough   No acute focal deficit" per Dr. Minus Liberty H&P    LABS: Hgb 7.8 Hct 24.7 Platelet Count 119 RBC 2.56 Glucose 118 BUN 43 Creatinine 7.40 Calcium 7.1 Anion gap 19.3 EGFR 8 Alkaline Phosphatase 237 Albumin 2.9 Troponin 0.13 C-diff Positive     XR Chest AP Portable:  Minimal pulmonary edema.    XR Abdomen AP:  No definite acute process in the abdomen.  Chronic pleural thickening versus small bilateral effusions    ASSESSMENT AND PLAN:   .Chest pain, chest tightness and SOB in context of history of CAD and status post stent placed in 2010   Admitted to telemetry  Twelve-lead EKG  Trend troponins  Discussed with cardiologist  Nuclear stress test tomorrow if troponin remains flat.  Transthoracic echocardiogram ordered  Heparin drip was not initiated because patient has unexplained blood loss requiring blood transfusion every other week after dialysis as per patient.  Continue beta blocker  Continue statin  Patient had anaphylaxis with NSAIDs  We'll defer antiplatelet management to cardiologist.    End-stage renal disease on dialysis  MWF via left chest permacath  Nonfunctioning AV fistula in the right arm and left forearm and left arm  On dialysis Monday Wednesday Friday  Last  dialysis on last Friday.  Case discussed with Dr. Lajean Saver: Nephrologist.  Electrolyte imbalance  Mild fluid overload her next or chest  Patient is asking for extra dialysis today.    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    Recent history of Clostridium difficile associated diarrhea  Completed 14 day course of oral vancomycin and Flagyl  Having recurrent vomiting and his diarrhea  Started on Dificid   Recheck C. difficile stool PCR  Contact isolation  X-ray abdomen to rule out toxic megacolon  Clear liquid diet; advance as tolerated  Recent CT abdomen pelvis done on 03/20/17    Chronic anemia  As per patient he requires blood transfusion every other week  with dialysis  Chart review showed his baseline hemoglobin is around 7.5  As per patient he had hemoglobin of 6.8 on 04/08/17 and got 1 unit of transfusion after dialysis  Check Hemoccult test.  Added Protonix.  Daily monitoring of H&H.  Blood transfusion if hemoglobin is less than 7.      Essential hypertension  Uncontrolled on admsison   Resume home meds   Additional PRn hydralazine.      OSA   CPAP at night .  O2 as needed to keep Spo2 >91%      CODE STATUS: Full code  No emergency contact documented.    DVT prophylaxis: Currently on not on chemical DVT prophylaxis because of low H&H  Reported unexplained blood loss by the patient    Disposition: Admit to telemetry 2.  Cardiology, nephrology consult.  X-ray abdomen.    CARDIOLOGY:  Assessment:   [name] is 46 y.o. male with shortness of breath and chest pain  Elevated troponin and BNP in setting of ESRD  Status post coronary stents  Current smoker  ESRD  Anemia-requiring transfusions --recommend heme consult--stool neg for blood  Ascites  Abdominal pain and c.difficile    Recommendations:     1. Echocardiogram  2. If intervention done patient would need antiplatelets-need a diagnosis for anemia if possible  3.        Will decide re cath vs nuclear test pending result of echocardiogram    NEPHROLOGY:  Assessment:     3. ESRD secondary to HTN on maintenance HD q MWF; previously in [place] but has moved to [facility] area without having arranged transfer of care or securing outpatient dialysis locally.  4. Anemia in ESRD  5. SHPTH  6. Exhausted upper extremity vascular access sites  7. Essential HTN  8. Diarrhea, etiology to be determined  9. Hx of CAD  10. Hx of asthma  11. OSA  12. Hx of recurrent C diff colitis  13. Hx of recurrent ascites (from care everywhere review)      Plan:      Unable to confirm his previous dialysis regimen and target weight.   Case manager will need to work on getting him transferred from his home center in  [place] to the [facility] center which is his preference.     He  does not have a permanent place of residence in the region but states it is his intention to find one and hence won't be returning to [place].     Renal Diet and protein supplements recommended but he declines and is asking for more liberal diet against medical advice.   Meds reviewed and adjusted for ESRD and dialysis.   Will resume usual EPO and activated vitamin D once his current doses can be confirmed with the outpatient unit.     No urgent need for dialysis today; plan to run 04/11/17      We will follow the patient with you during this hospitalization.  Ilda Mori, MD, thank you for the opportunity to assist in the care of this patient.        Thank you for consulting and allowing [place] to participate in this patient's care! We will follow along while the patient is in the hospital.      Admit to Surg Tele on telemetry daily weight vitasl q 4 hrs SCD Nebulizer treatment q 6 hrs SCD encourage fluids O2 to maintain Sat 92% or greater Cardiac/Na restriction/15102ml Fluid restriction diet     MEDICATIONS: DuoNeb 3ml q 6 hrs Norvasc 10mg  PO Daily Lipitor 80mg  PO @ HS Coreg 12.5mg  PO q 12 hrs Dificid 200mg  PO 2 times daily Apresoline 100mg  PO 3 times daily Lactobacillus PO Daily Protonix 40mg  PO Daily Renvela 800mg  PO 3 times daily with meals Dilaudid 0.5-1mg  IV q 4 hrs PRN x 4 Zofran ODT 4mg  PO daily PRN x 1     DATE OF REVIEW: 04/11/2017    VITALS: BP 146/84   Pulse 94   Temp 97 F (36.1 C) (Oral)   Resp 18   Ht 1.854 m (6\' 1" )   Wt 112.7 kg (248 lb 7.3 oz)   SpO2 98%   BMI 32.78 kg/m     Active Hospital Problems    Diagnosis   . Unstable angina     LABS: Hgb 7.8 Hct 24.3 RBC 2.53 Glucose 118 BUN 50 Creatinine8.27 Calcium 7.2 Anion Gap 18.7 EGFR 8 Alkaline Phosphatase 242 Albumin 2.9 Troponin 0.13/0.14/0.13/0.11 BNP 3149.4 Ferritin S 2541.98 Folate 5.7     ECHO: Pending     MEDICAL: Admitted to telemetry  Twelve-lead EKG  no acute ST T changes   Troponins ; 0.14> 0.13>0.11   cardiologist on board   Further work up as per the cardiologist   Transthoracic echocardiogram   Heparin drip was not initiated because patient has unexplained blood loss requiring blood transfusion every other week after dialysis as per patient.  Continue beta blocker  Continue statin  Patient had anaphylaxis with NSAIDs  We'll defer antiplatelet management to cardiologist.    End-stage renal disease on dialysisMWF via left chest permacath  Nonfunctioning AV fistula in the right arm and left forearm and left arm  On dialysis Monday Wednesday Friday  Nephrologist on board   HD as per Nephrologist     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    Recurrent f Clostridium difficile associated diarrhea  Completed 14 day course of oral vancomycin and Flagyl  Cont  Dificid started on 12.23.18   Contact isolation  X-ray abdomen No definite acute process in the abdomen.  Clear liquid diet; advance as tolerated  Recent CT abdomen pelvis done on 03/20/17; ascites     Chronic anemia  As per patient he requires blood transfusion every other week with dialysis  Chart review showed his baseline hemoglobin is  around 7.5  As per patient he had hemoglobin of 6.8 on 04/08/17 and got 1 unit of transfusion after dialysis  Check Hemoccult test.  Cont  Protonix.  Daily monitoring of H&H.  Blood transfusion if hemoglobin is less than 7.      Essential hypertension  Uncontrolled on admsison   Cont  home meds   Additional PRN hydralazine.      OSA   Cont CPAP at night .  O2 as needed to keep Spo2 >91%  Pt doesn't have a CPAP machine at home   Asking to arrange for CPAP machine at home.      CODE STATUS:Full code  No emergency contact documented.    DVT prophylaxis:Currently on not on chemical DVT prophylaxis because of low H&H  Reported unexplained blood loss by the patient    Disposition: cardiac work up in progress .  Cont abx for CDAD     INTERVENTIONAL  CARDIOLOGY:  Assessment:     [name] 47 y.o.malewith    1. CAD s/p 2 stents (2010)   2. HTN  3. ESRD  4. OSA  5. End stage renal disease on dialysis   6. Elevated troponins (flat) and CP  7. Anemia    Plan:     1. His anemia seems to be ACD rather than true iron deficiency; recommend evaluation per primary service and consideration of endoscopic evaluation as it seems out of proportion to his ESRD  2. Consider LHC Wednesday  3. F/u TTE    MEDICATIONS: Nephrovite 1mg  PO Daily Dificid 200mg  PO 2 times daily Folvite 1 mg PO Daily Lactobacillus PO Daily Protonix 40mg  PO Daily Renvela 800mg  PO 3 times daily with meals Dilaudid 0.5-1mg  IV q 4 hrs PRN x 3     [name] L. [facility] RN BSN  [facility]  Utilization Review  [address]  [place] [address]  [phone]  Fax [phone]

## 2017-04-11 NOTE — Progress Notes (Signed)
[facility]  Progress Note    Date Time: 04/11/17 11:18 AM  Patient Name: [name]    Subjective:   C/o intermittent chest pressure  Denies recent cardiology care  States he does take his meds for HTN    Physical Exam:     Visit Vitals  BP 146/84   Pulse 94   Temp 97 F (36.1 C) (Oral)   Resp 18   Ht 1.854 m (6\' 1" )   Wt 112.7 kg (248 lb 7.3 oz)   SpO2 98%   BMI 32.78 kg/m       General/Constitutional: no acute distress  HEENT: moist mucous membranes  Neck: supple, no JVD  Heart/Cardiovascular:   RRR  no murmurs/rubs/gallops  1+ radial pulses  Respiratory/Lungs: non-labored, clear to auscultation throughout  Abdominal/Gastrointestinal: soft, nontender  Extremities: warm and well-perfused, 2+ LE edema, failed UE fistulae, +port  Skin: no rash  Neurological: alert and oriented x3, grossly nonfocal  Psych: calm    Medications:     Current Facility-Administered Medications   Medication Dose Route Frequency   . albuterol-ipratropium  3 mL Nebulization Q6H SCH   . amLODIPine  10 mg Oral Daily   . atorvastatin  80 mg Oral QHS   . B complex-vitamin C-folic acid  1 tablet Oral Daily   . carvedilol  12.5 mg Oral Q12H SCH   . docusate sodium  100 mg Oral Daily   . fidaxomicin  200 mg Oral BID   . folic acid  1 mg Oral Daily   . heparin (porcine)  5,000 Units Subcutaneous Q8H SCH   . hydrALAZINE  100 mg Oral TID   . lactobacillus species  50 Billion CFU Oral Daily   . pantoprazole  40 mg Oral Daily   . senna-docusate  2 tablet Oral QHS   . sevelamer  800 mg Oral TID MEALS   . sodium chloride (PF)  3 mL Intravenous Q8H     Prior to Admission medications    Medication Sig Start Date End Date Taking? Authorizing Provider   albuterol (PROVENTIL HFA;VENTOLIN HFA) 108 (90 Base) MCG/ACT inhaler Inhale 2 puffs into the lungs every 4 (four) hours as needed for Shortness of Breath.   Yes [provider]   amLODIPine (NORVASC) 10 MG tablet Take 10 mg by mouth daily.   Yes [provider]   carvedilol  (COREG) 12.5 MG tablet Take 12.5 mg by mouth.   Yes [provider]   hydrALAZINE (APRESOLINE) 100 MG tablet Take 100 mg by mouth 3 (three) times daily.   Yes [provider]   sevelamer (RENAGEL) 800 MG tablet Take 800 mg by mouth 3 (three) times daily with meals.   Yes [provider]   albuterol-ipratropium (COMBIVENT RESPIMAT) 20-100 MCG/ACT Aero Soln Inhale 1 puff into the lungs 4 (four) times daily.    [provider]         Labs and studies reviewed    Problem List:   Active Problems:    Unstable angina       Assessment:     [name] is 46 y.o. male with    1. CAD s/p 2 stents (2010)   2. HTN  3. ESRD  4. OSA  5. End stage renal disease on dialysis   6. Elevated troponins (flat) and CP  7. Anemia    Plan:     1. His anemia seems to be ACD rather than true iron deficiency; recommend evaluation  per primary service and consideration of endoscopic evaluation as it seems out of proportion to his ESRD  2. Consider LHC Wednesday  3. F/u TTE    Thank you for consulting [facility] and allowing [place] to participate in this pt's care! We will follow along while the pt is in the hospital.    Seward Speck, MD, [facility], RPVI  Interventional Cardiology  [facility]  [address], [address]  [place], [place] [address]  Tel [phone]  Fax [phone]

## 2017-04-11 NOTE — Progress Notes (Signed)
Patient: [name]  Date: 04/11/2017   PCP: Christa See, Armilda Vanderlinden  Admission Date: 04/10/2017     Interim Summary:  46 y.o. male from [place] AV was viting his father in this area ;  PMH of Essential HTN , ESRD with chronic anemia with base line Hb ~7.5 on HD; MWF , CAD s/p  2 stents placed post MI in 2010 at [facility] ,OSA was not on CPAP at home  was transferred   from outside facility for higher level of care for SOB   And increasing diarrhea .  Pt reports that he has had increasing diarrhea for one week, and he says it same as the C.Diff that he has had in the past  , he stopped flagyl 2 weeks ago for a bout of C.Diff.   He says he was just discharged from a hospital in [place].   He reports that he has history of MI with stents in 2010 developed chest pressure "like something sitting on my chest" along with shortness of breath prior to reporting to [facility] .   He has also had some abdominal cramping. He gets dialysis M,W,F and he did have dialysis on Friday; 12.21.18   He stated that  He got one unit of PRBC on 12.21.18 after HD  B/o low hb of 6.8   which he says he gets every other weeks for unexplained GI blood loss.    Old chart reviewed on care everywhere :  CTA Chest: and abd done on 12.2.18  Pulmonary arteries are adequately opacified without acute or chronic filling defects. The thoracic aorta is normal in course and caliber without dissection or aneurysm.   The heart size is enlarged. There is a small pericardial effusion. Thoracic lymph nodes are not enlarged. Vascular calcifications are seen.  Bilateral pleural effusions are identified with bibasilar atelectasis, left greater than right. There is no pleural thickening or pneumothorax. The airways are patent.  Abdomen:  A small hiatal hernia is seen. Hepatic steatosis is noted. The pancreas, spleen, adrenal glands, and kidneys otherwise demonstrate no acute pathology.  Bilateral renal atrophy is identified.  Soft tissue anasarca is seen. There is no free air or lymph node enlargement. Abdominal aorta is not aneurysmal.  Pelvis:  There is a large amount of abdominopelvic ascites. Scattered colonic diverticuli are seen. Postoperative changes are seen following ventral hernia repair. There is no bowel wall thickening or obstruction. There is no free fluid. Lymph nodes are not   enlarged. Urinary bladder is unremarkable.  Skeleton:   There are no acute fractures. No suspicious bony lesions.    CMP done on admission: BUN/creatinine 47/7.56  Alkaline phosphatase 254  Normal AST ALT  Normal sodium potassium and bicarbonate  Venous glucose of 89.  CBC: WBC 6.5, H&H 8.3/25.1, platelet count 145.  First troponin 0.13.  EKG findings:  Sinus tachycardia   Probable left atrial enlargement   Borderline prolonged QT interval   Baseline wander in lead(s) V2   No previous ECG available for comparison   QTc 493     Cardiology and nephrology consulted .       Subjective :  Diarrhea is better   SOB ++  abd distension from fluid     Assessment and Plan:     Chest pain, chest tightness and SOB in context of history of CAD and status post stent placed in 2010   Admitted to telemetry  Twelve-lead EKG no acute ST T changes   Troponins ;  0.14> 0.13>0.11   cardiologist on board   Further work up as per the cardiologist   Transthoracic echocardiogram   Heparin drip was not initiated because patient has unexplained blood loss requiring blood transfusion every other week after dialysis as per patient.  Continue beta blocker  Continue statin  Patient had anaphylaxis with NSAIDs  We'll defer antiplatelet management to cardiologist.    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     Recurrent f Clostridium difficile associated diarrhea  Completed 14 day course of oral vancomycin and Flagyl  Cont  Dificid started on 12.23.18   Contact isolation  X-ray abdomen No definite acute process in the abdomen.  Clear liquid diet; advance as tolerated  Recent CT abdomen pelvis done on 03/20/17; ascites     Chronic anemia  As per patient he requires blood transfusion every other week with dialysis  Chart review showed his baseline hemoglobin is around 7.5  As per patient he had hemoglobin of 6.8 on 04/08/17 and got 1 unit of transfusion after dialysis  Check Hemoccult test.  Cont  Protonix.  Daily monitoring of H&H.  Blood transfusion if hemoglobin is less than 7.      Essential hypertension  Uncontrolled on admsison   Cont  home meds   Additional PRN hydralazine.      OSA   Cont CPAP at night .  O2 as needed to keep Spo2 >91%  Pt doesn't have a CPAP machine at home   Asking to arrange for CPAP machine at home.      CODE STATUS: Full code  No emergency contact documented.    DVT prophylaxis: Currently on not on chemical DVT prophylaxis because of low H&H  Reported unexplained blood loss by the patient    Disposition: cardiac work up in progress .  Cont abx for CDAD         LOS 1 Days  Naylea Wigington Otho Ket, Mabrey Howland    04/11/2017 8:59 AM    Please contact at phone number [phone]  if any questions.      Objective   Exam: Patient is awake, alert and oriented x 3  HEENT: PERRLA, EOMI  Neck: supple, without JVD  Chest: CTA bilaterally. No crackles or wheezing.  CVS: S1, S2 regular rate and rhythm  no murmurs    Abdomen: soft and +ascites    with good bowel sounds.  Musculoskeletal: No pitting edema, pulses palpable  Skin: Warm dry no worrisome lesions  NEURO:  Cranial nerve II-12 intact no motor or sensory deficits  Psychiatric: alert, interactive, appropriate, normal affect.    I/O last 3 completed shifts:  In: 480 [P.O.:480]  Out: 1000 [Urine:1000]  Vitals:    04/10/17 2107 04/10/17 2352 04/11/17 0603  04/11/17 0720   BP: (!) 155/92 148/82  146/84   Pulse: 94 93  94   Resp: 16 18  18    Temp: 97.1 F (36.2 C) 97 F (36.1 C)  97 F (36.1 C)   TempSrc: Oral Oral  Oral   SpO2:  97%  98%   Weight:   112.7 kg (248 lb 7.3 oz)    Height:   1.854 m (6\' 1" )      Wt Readings from Last  4 Encounters:   04/11/17 112.7 kg (248 lb 7.3 oz)   04/10/17 111.1 kg (245 lb)       Current Facility-Administered Medications   Medication Dose Route Frequency   . albuterol-ipratropium  3 mL Nebulization Q6H SCH   . amLODIPine  10 mg Oral Daily   . atorvastatin  80 mg Oral QHS   . carvedilol  12.5 mg Oral Q12H SCH   . docusate sodium  100 mg Oral Daily   . fidaxomicin  200 mg Oral BID   . heparin (porcine)  5,000 Units Subcutaneous Q8H SCH   . hydrALAZINE  100 mg Oral TID   . lactobacillus species  50 Billion CFU Oral Daily   . pantoprazole  40 mg Oral Daily   . senna-docusate  2 tablet Oral QHS   . sevelamer  800 mg Oral TID MEALS   . sodium chloride (PF)  3 mL Intravenous Q8H                Recent Labs  Lab 04/11/17  0446 04/10/17  1009   WBC 5.4  5.1 6.5   RBC 2.48*  2.53* 2.64*   Hemoglobin 7.8*  7.8* 8.3*   Hematocrit 24.0*  24.3* 25.1*   MCV 97  96 95   PLT CT 132  130 145       Recent Labs  Lab 04/11/17  0446   PT 10.7   PT INR 1.1       Recent Labs  Lab 04/10/17  2038 04/10/17  1613 04/10/17  1009   Troponin I 0.11* 0.13* 0.14*     No results found for: [id number]   Recent Labs  Lab 04/11/17  0446 04/10/17  1009 04/10/17  0536   Glucose 74  71 89 118*   Sodium 140  141 140 143   Potassium 4.9  4.9 4.6 4.3   Chloride 104  105 103 103   CO2 22.2  23.8 22.3 25.0   BUN 50*  53* 47* 43*   Creatinine 8.27*  8.16* 7.56* 7.40*   EGFR 8*  8* 9* 8*   Calcium 7.2*  7.2* 7.5* 7.1*       Recent Labs  Lab 04/11/17  0446 04/10/17  1009   Magnesium 1.7  --    Phosphorus 4.6  --    Albumin 2.9* 3.0*   Protein, Total 6.6 6.8   Bilirubin, Total 0.7 0.7   Alkaline Phosphatase 242* 254*   ALT 14 15   AST (SGOT) 24 26           Invalid  input(s):  AMORPHOUSUA   Xr Abdomen Ap    Result Date: 04/10/2017  No definite acute process in the abdomen. Chronic pleural thickening versus small bilateral effusions ReadingStation:[id number]    Xr Chest Ap Portable    Result Date: 04/10/2017  Minimal pulmonary edema. ReadingStation:[id number]    Labs and radiology reports have been reviewed by me  MRN: [id number]

## 2017-04-11 NOTE — Progress Notes (Signed)
10:00 am - Nurse woke the patient up for morning medications. The patient reported 10/10 chest pain but refused nitroglycerin despite nurse explaining purpose of Nitro. Patient refused 10am blood pressure meds until after dialysis. Nurse explained that his blood pressure was 146/84 and his dialysis was scheduled for 2pm. Pt still refused blood pressure medications.     12:30 pm - Patient refused afternoon vital signs. Will monitor.

## 2017-04-11 NOTE — Progress Notes (Signed)
Dialysis Treatment Note  Patient requested treatment to be ended early. He c/o cramping, and refused to decrease goal or received NS bolus. Total time in machine 3:15 hrs. Access: L upper chest perm catheter is patent with good flow. Dressing changed, no s/s of infection on site. Net UF 3200 mL.    Patient:  [name] MRN#:  [id number]  Unit/Room/Bed:  332/332-A   Isolation: Contact Special       Allergies: Allergies   Allergen Reactions   . Lisinopril Anaphylaxis   . Morphine Anaphylaxis     Quite breathing   . Toradol [Ketorolac Tromethamine] Anaphylaxis     Code Status: Full Code    Problem List:   Patient Active Problem List    Diagnosis Date Noted   . Unstable angina 04/10/2017       Dialysis Order:  Active Orders   Dialysis    Hemodialysis inpatient     Frequency: Once     Start Date/Time: 04/11/17 0000     Number of Occurrences:  1 Occurrences     Order Questions:     . Date of Dialysis 04/11/2017     . K+ (initial) 2 mEq     . KPP (Per Protocol) Yes     . Ca++ 2.5 mEq     . Bicarb 35 mEq     . Na+ 138 mEq     . Dialyzer ReviClear     . Dialysate Temperature (C) 37     . BFR-As tolerated to a maximum of: 450 mL/min     . DFR 600 mL/min     . Duration of Treatment 4 Hours     . Fluid Removal (L) and Dry Weight (Kg) Other (please specify) (Comment: 3.5 liters)     . Limit Ultrafiltration Rate Yes     . Specify mL/hr/kg 13     . Access Type-Location Tunneled-L    Hemodialysis inpatient     Frequency: Once     Start Date/Time: 04/13/17 0000     Number of Occurrences:  1 Occurrences     Order Questions:     . Date of Dialysis 04/13/2017     . K+ (initial) 2 mEq     . KPP (Per Protocol) Yes     . Ca++ 2.5 mEq     . Bicarb 35 mEq     . Na+ 138 mEq     . Dialyzer ReviClear     . Dialysate Temperature (C) 37     . BFR-As tolerated to a maximum of: 450 mL/min     . DFR 600 mL/min     . Duration of Treatment 4 Hours     . Fluid Removal (L) and Dry Weight (Kg) Other (please specify) (Comment: 3.5 liters)     . Limit  Ultrafiltration Rate Yes     . Specify mL/hr/kg 13     . Access Type-Location Tunneled-L        Dialysis Treatment Type:    Time out/Safety Check: Time Out/Safety Check Completed: Yes (04/11/17 1430)  [facility] Consent: Consent for HD signed for this hospitalization: Yes (04/11/17 1430)  Blood Consent (if applicable): Blood Consent Verified: Not Applicable (04/11/17 1430)    Dialysis Access:  Permacath/Temporary Catheter Permacath Left (Active)   Line necessity reviewed? Dialysis 04/11/2017  2:30 PM   Catheter Lumen Volume Venous 2.2 mL 04/11/2017  7:00 PM   Catheter Lumen Volume Arterial 2.2 mL 04/11/2017  7:00 PM  Dressing Status and Intervention Dressing Intact;Clean & Dry 04/11/2017  7:00 PM   Tego Caps on Catheter Yes 04/11/2017  2:30 PM   NEW Tego Caps placed (Date) 04/11/17 04/11/2017  7:00 PM   End Caps Free From Blood Yes 04/11/2017  2:30 PM   Line Care Connections checked and tightened 04/11/2017  2:30 PM   Dressing Type Transparent 04/11/2017  2:30 PM   Dressing Status Clean;Removed 04/11/2017  2:30 PM   Dressing/Line Intervention Dressing changed 04/11/2017  2:30 PM   Line Used For Blood Draw Yes 04/11/2017  2:30 PM   Dressing Change Due 04/18/17 04/11/2017  2:30 PM           General Assessments:       04/11/17 1430   Assessment   Mental Status Alert;Oriented;Cooperative   Cardiac (WDL) X   Cardiac Regularity Regular   Cardiac Symptoms (SOB with exertion)   Cardiac Rhythm Sinus Tach   Respiratory  WDL   Respiratory Pattern Regular;Dyspnea with exertion   Bilateral Breath Sounds Clear   Edema  X   Generalized Edema +1   RLE Edema +1   LLE Edema +1   General Skin Color Appropriate for ethnicity   Skin Condition/Temp Warm;Dry   Gastrointestinal (WDL) X   Abdomen Inspection Soft;Rounded   GI Symptoms Diarrhea   Mobility Bed        04/11/17 1430   Permacath/Temporary Catheter Permacath Left   No Placement Date or Time found.   Present on Admission?: Yes  Access Type: Permacath  Orientation: Left   Access Location: Tunneled, Chest   Line necessity reviewed? Dialysis   Catheter Lumen Volume Venous 2.2 mL   Catheter Lumen Volume Arterial 2.2 mL   Tego Caps on Catheter Yes   NEW Tego Caps placed (Date) (unknown. tego caps removed.)   End Caps Free From Blood Yes   Line Care Connections checked and tightened   Dressing Type Transparent   Dressing Status Clean;Removed   Dressing/Line Intervention Dressing changed   Line Used For Blood Draw Yes   Dressing Change Due 04/18/17        04/11/17 1900   Assessment   Cardiac (WDL) X   Cardiac Regularity Regular   Cardiac Rhythm Normal Sinus Rhythm;Sinus Tach   Respiratory  X   Respiratory Pattern Regular;Dyspnea with exertion   Bilateral Breath Sounds Clear;Diminished  (diminished at lower lobes)   Generalized Edema Non Pitting Edema   RLE Edema +1   LLE Edema +1   Gastrointestinal (WDL) WDL   Mobility Bed       Pain Assessment: Pain Assessment  Charting Type: Assessment (04/11/17 1430)  Pain Scale Used: Numeric Scale (0-10) (04/11/17 1430)    Labs Values:    HBsAg Result:HBsAg (Antigen) Result: Negative (04/11/17 1430)  HBsAg Date Drawn: HBsAg Date Drawn: 04/11/17 (04/11/17 1430)  HBsAg Next Due Date:HBsAg Repeat Draw Due Date: 05/10/17 (04/11/17 1430)    Lab Results   Component Value Date    BUN 50 (H) 04/11/2017    BUN 53 (H) 04/11/2017    NA 140 04/11/2017    NA 141 04/11/2017    K 4.9 04/11/2017    K 4.9 04/11/2017    CREAT 8.27 (H) 04/11/2017    CREAT 8.16 (H) 04/11/2017    CO2 22.2 04/11/2017    CO2 23.8 04/11/2017    CA 7.2 (L) 04/11/2017    CA 7.2 (L) 04/11/2017    PHOS 4.6 04/11/2017    GLU 74 04/11/2017  GLU 71 04/11/2017    ALB 2.9 (L) 04/11/2017    HGB 7.8 (L) 04/11/2017    HGB 7.8 (L) 04/11/2017    HCT 24.3 (L) 04/11/2017    HCT 24.0 (L) 04/11/2017    WBC 5.1 04/11/2017    WBC 5.4 04/11/2017    PLT 130 04/11/2017    PLT 132 04/11/2017    PT 10.7 04/11/2017    INR 1.1 04/11/2017         Current Diet Order      Diet cardiac Na restriction, if any: NO ADDED  SALT; Fluid restriction: 1500 ML FLUID     RO/Hemodialysis Cabin crew - Before Each Treatment:  RO/Hemodialysis Dance movement psychotherapist Number: 25 (04/11/17 1430)  Water Hardness: 0 (04/11/17 1430)  pH: 7.4 (04/11/17 1430)  Pressure Test Verified: Yes (04/11/17 1430)  Alarms Verified: Passed (04/11/17 1430)  Machine Temperature: 98.6 F (37 C) (04/11/17 1430)  Alarms Verified: Yes (04/11/17 1430)  Na+ mEq (Machine): 138 mEq (04/11/17 1430)  Bicarb mEq (Machine): 35 mEq (04/11/17 1430)  Hemodialysis Conductivity (Machine): 14 (04/11/17 1430)  Hemodialysis Conductivity (Meter): 14.2 (04/11/17 1430)  Dialyzer Lot Number: [id number] (04/11/17 1430)  Tubing Lot Number: [id number] (04/11/17 1430)  RO Machine Log Completed: Yes (04/11/17 1430)    Chlorine Testing:  RO/Hemodialysis Machine Safety Checks  Is Total Chlorine less than 0.1 ppm?: Yes (04/11/17 1430)  Orignial Total Chlorine Testing Time: 1115 (04/11/17 1430)  At 4 Hour Total Chlorine Testing Time: 1530 (04/11/17 1430)    Vitals and Weight:  Patient Vitals for the past 4 hrs:   BP Temp Pulse Resp   04/11/17 1900 (!) 185/99 98.6 F (37 C) 90 20   04/11/17 1840 (!) 178/115 - 99 -   04/11/17 1830 (!) 167/95 - 98 -   04/11/17 1815 144/80 - 93 -   04/11/17 1800 (!) 155/102 - 97 -   04/11/17 1745 (!) 158/105 - (!) 103 -   04/11/17 1730 (!) 166/108 - (!) 102 -   04/11/17 1715 (!) 141/93 - (!) 103 -   04/11/17 1700 (!) 163/93 - (!) 109 -   04/11/17 1645 154/81 - (!) 107 -   04/11/17 1630 161/84 - 99 -   04/11/17 1615 145/86 - 89 -   04/11/17 1600 152/55 - 100 -   04/11/17 1545 137/89 - 78 -   04/11/17 1530 121/75 - 95 -   04/11/17 1525 115/77 - 95 -        Dialysis Weight  Pre-Treatment Weight (Kg): 112.7 (04/11/17 1430)  Scale Type: ICU Bed Scale (04/11/17 1430)  Post-Treatment Weight (Kg): 109.5 (04/11/17 1900)    Treatment:   04/11/17 1430 04/11/17 1525 04/11/17 1530   Vitals   Temp 97.5 F (36.4 C) --  --    Heart Rate (!) 101 95 95   Resp Rate 20  --  --    BP 106/69 115/77 121/75   Machine Metrics   Blood Flow Rate (mL/min) --  200 mL/min 450 mL/min   Arterial Pressure (mmHg) --  50 mmHg 190 mmHg   Venous Pressure (mmHg) --  80 170   Dialysate Flow Rate (mL/min) --  600 mL/min 600 mL/min   Transmembrane Pressure (mmHg) --  120 mmHg 100 mmHg   Ultrafiltration Rate (mL/Hr) --  1360 mL/hr 1360 mL/hr   Fluid Removal (ml) --  0 ml 89 ml   Fluid Bolus (ml) --  200 ml 0 ml  Dialysate K (mEq/L) --  2 mEq/L --    Dialysate CA (mEq/L) --  2.5 mEq/L --    Hemodialysis Comments   Arteriovenous Lines Secure --  Yes Yes   Comments --  HD tx started via L perm cath Pt awake watching TV       04/11/17 1545 04/11/17 1600 04/11/17 1615   Vitals   Temp --  --  --    Heart Rate 78 100 89   Resp Rate --  --  --    BP 137/89 152/55 145/86   Machine Metrics   Blood Flow Rate (mL/min) 450 mL/min 450 mL/min 450 mL/min   Arterial Pressure (mmHg) 200 mmHg 190 mmHg 180 mmHg   Venous Pressure (mmHg) 170 170 180   Dialysate Flow Rate (mL/min) 600 mL/min 600 mL/min 600 mL/min   Transmembrane Pressure (mmHg) 100 mmHg 100 mmHg 90 mmHg   Ultrafiltration Rate (mL/Hr) 1360 mL/hr 1360 mL/hr 1360 mL/hr   Fluid Removal (ml) 357 ml 645 ml 938 ml   Fluid Bolus (ml) 0 ml 0 ml 0 ml   Dialysate K (mEq/L) --  --  --    Dialysate CA (mEq/L) --  --  --    Hemodialysis Comments   Arteriovenous Lines Secure Yes Yes Yes   Comments Pt is resting Goal increased --        04/11/17 1630 04/11/17 1645 04/11/17 1700   Vitals   Temp --  --  --    Heart Rate 99 (!) 107 (!) 109   Resp Rate --  --  --    BP 161/84 154/81 (!) 163/93   Machine Metrics   Blood Flow Rate (mL/min) 450 mL/min 450 mL/min 450 mL/min   Arterial Pressure (mmHg) 190 mmHg 200 mmHg 190 mmHg   Venous Pressure (mmHg) 170 170 180   Dialysate Flow Rate (mL/min) 600 mL/min 600 mL/min 600 mL/min   Transmembrane Pressure (mmHg) 100 mmHg 100 mmHg 100 mmHg   Ultrafiltration Rate (mL/Hr) 1260 mL/hr 1260 mL/hr 1260 mL/hr   Fluid Removal (ml) 1166 ml 1526  ml 1742 ml   Fluid Bolus (ml) 0 ml 0 ml 0 ml   Dialysate K (mEq/L) --  --  --    Dialysate CA (mEq/L) --  --  --    Hemodialysis Comments   Arteriovenous Lines Secure Yes Yes Yes   Comments --  --  Dr. in room to see pt       04/11/17 1715 04/11/17 1730 04/11/17 1745   Vitals   Temp --  --  --    Heart Rate (!) 103 (!) 102 (!) 103   Resp Rate --  --  --    BP (!) 141/93 (!) 166/108 (!) 158/105   Machine Metrics   Blood Flow Rate (mL/min) 420 mL/min 420 mL/min 420 mL/min   Arterial Pressure (mmHg) 180 mmHg 180 mmHg 170 mmHg   Venous Pressure (mmHg) 160 160 160   Dialysate Flow Rate (mL/min) 600 mL/min 600 mL/min 600 mL/min   Transmembrane Pressure (mmHg) 100 mmHg 100 mmHg 100 mmHg   Ultrafiltration Rate (mL/Hr) 1180 mL/hr 1180 mL/hr 1180 mL/hr   Fluid Removal (ml) 2034 ml 2399 ml 2700 ml   Fluid Bolus (ml) 0 ml 0 ml 0 ml   Dialysate K (mEq/L) --  --  --    Dialysate CA (mEq/L) --  --  --    Hemodialysis Comments   Arteriovenous Lines Secure Yes Yes Yes  Comments --  c/o leg cramping. refused to decreased goal --        04/11/17 1800 04/11/17 1815 04/11/17 1830   Vitals   Temp --  --  --    Heart Rate 97 93 98   Resp Rate --  --  --    BP (!) 155/102 144/80 (!) 167/95   Machine Metrics   Blood Flow Rate (mL/min) 420 mL/min 420 mL/min 420 mL/min   Arterial Pressure (mmHg) 180 mmHg 170 mmHg 180 mmHg   Venous Pressure (mmHg) 160 170 160   Dialysate Flow Rate (mL/min) 600 mL/min 600 mL/min 600 mL/min   Transmembrane Pressure (mmHg) 100 mmHg 100 mmHg 100 mmHg   Ultrafiltration Rate (mL/Hr) 1180 mL/hr 1180 mL/hr 1180 mL/hr   Fluid Removal (ml) 3056 ml 3220 ml 3472 ml   Fluid Bolus (ml) 0 ml 0 ml 0 ml   Dialysate K (mEq/L) --  --  --    Dialysate CA (mEq/L) --  --  --    Hemodialysis Comments   Arteriovenous Lines Secure Yes Yes Yes   Comments --  --  Pt c/o cramping, trying to stand up. Refused to decreased goal or receive saline bolus        04/11/17 1840 04/11/17 1900   Vitals   Temp --  98.6 F (37 C)   Heart Rate 99  90   Resp Rate --  20   BP (!) 178/115 (!) 185/99   Machine Metrics   Blood Flow Rate (mL/min) 200 mL/min --    Arterial Pressure (mmHg) 30 mmHg --    Venous Pressure (mmHg) 60 --    Dialysate Flow Rate (mL/min) 600 mL/min --    Transmembrane Pressure (mmHg) 100 mmHg --    Ultrafiltration Rate (mL/Hr) 1180 mL/hr --    Fluid Removal (ml) 3650 ml --    Fluid Bolus (ml) 250 ml --    Dialysate K (mEq/L) --  --    Dialysate CA (mEq/L) --  --    Hemodialysis Comments   Arteriovenous Lines Secure Yes --    Comments Pt requested to end treatment. Unable to make him to change his mind. Rinsed back. Tx ended. --         Intake/Output:  No intake/output data recorded.      Medications:  Current Facility-Administered Medications   Medication Dose Route Last Rate Last Dose        Education:  Education  Person taught: Patient (04/11/17 1900)  Knowledge basis: Substantial (04/11/17 1900)  Topics taught: Fluid Management;Procedure (04/11/17 1900)  Teaching Tools: Explain (04/11/17 1900)  Patty SermonsTrenton Gammon Understanding (04/11/17 1900)    Primary Nurse Communication:  Bedside Nurse Communication  Name of bedside RN - pre dialysis: Andy Gauss, RN (04/11/17 1430)  Name of bedside RN - post dialysis: Andy Gauss, RN (04/11/17 1900)      [facility] nurse signature/date/time: Alysia Penna. Ardyn Forge, BSN, RN, 04/11/2017 @ 19:21

## 2017-04-11 NOTE — Progress Notes (Addendum)
Pt has complaint of >10 abd pain. Pt states that the pain is worse than it was earlier in the day. Pt's BP is 210/88. Notified Dr. Alison Stalling and new orders received for clonidine and lidocaine patch. Notified pt, who was resting in bed using cell phone, pt stated that he did not want either of those medications because they "messed him up." I suggested IV hydralazine for the pt and he stated that hydralazine if effective for his BP, but it only brings him down a little. Pt states that his BP is normally in the 180's. Pt requested that Dr. Alison Stalling increase his dilaudid. I notified MD of pt's requests and new orders received. Pt refusing heparin injections. Dr. Alison Stalling notified, will have dayshift nurse notify attending in am. Will continue to monitor.

## 2017-04-12 ENCOUNTER — Inpatient Hospital Stay: Payer: Medicare Other

## 2017-04-12 LAB — ECG 12-LEAD
P Wave Axis: 52 deg
P-R Interval: 180 ms
Patient Age: 47 years
Q-T Interval(Corrected): 480 ms
Q-T Interval: 394 ms
QRS Axis: -5 deg
QRS Duration: 92 ms
T Axis: 66 years
Ventricular Rate: 89 //min

## 2017-04-12 LAB — CBC AND DIFFERENTIAL
Basophils %: 1.1 % (ref 0.0–3.0)
Basophils Absolute: 0.1 10*3/uL (ref 0.0–0.3)
Eosinophils %: 7.2 % — ABNORMAL HIGH (ref 0.0–7.0)
Eosinophils Absolute: 0.3 10*3/uL (ref 0.0–0.8)
Hematocrit: 24.5 % — ABNORMAL LOW (ref 39.0–52.5)
Hemoglobin: 7.9 gm/dL — ABNORMAL LOW (ref 13.0–17.5)
Lymphocytes Absolute: 0.4 10*3/uL — ABNORMAL LOW (ref 0.6–5.1)
Lymphocytes: 8.8 % — ABNORMAL LOW (ref 15.0–46.0)
MCH: 31 pg (ref 28–35)
MCHC: 32 gm/dL (ref 32–36)
MCV: 96 fL (ref 80–100)
MPV: 7.3 fL (ref 6.0–10.0)
Monocytes Absolute: 0.5 10*3/uL (ref 0.1–1.7)
Monocytes: 10 % (ref 3.0–15.0)
Neutrophils %: 72.9 % (ref 42.0–78.0)
Neutrophils Absolute: 3.4 10*3/uL (ref 1.7–8.6)
PLT CT: 131 10*3/uL (ref 130–440)
RBC: 2.56 10*6/uL — ABNORMAL LOW (ref 4.00–5.70)
RDW: 14.6 % — ABNORMAL HIGH (ref 11.0–14.0)
WBC: 4.6 10*3/uL (ref 4.0–11.0)

## 2017-04-12 MED ORDER — HYOSCYAMINE SULFATE ER 0.375 MG PO TB12
0.75 mg | ORAL_TABLET | Freq: Two times a day (BID) | ORAL | Status: DC
Start: 2017-04-12 — End: 2017-04-13
  Administered 2017-04-12 – 2017-04-13 (×2): 0.75 mg via ORAL
  Filled 2017-04-12 (×5): qty 2

## 2017-04-12 MED ORDER — CARVEDILOL 25 MG PO TABS
25.00 mg | ORAL_TABLET | Freq: Two times a day (BID) | ORAL | Status: DC
Start: 2017-04-12 — End: 2017-04-13
  Administered 2017-04-12: 21:00:00 25 mg via ORAL
  Filled 2017-04-12 (×3): qty 1

## 2017-04-12 MED ORDER — CARVEDILOL 6.25 MG PO TABS
12.50 mg | ORAL_TABLET | Freq: Once | ORAL | Status: DC
Start: 2017-04-12 — End: 2017-04-12
  Filled 2017-04-12: qty 2

## 2017-04-12 MED ORDER — VH HYDROMORPHONE HCL PF 1 MG/ML CARPUJECT
0.50 mg | Freq: Once | INTRAMUSCULAR | Status: DC
Start: 2017-04-12 — End: 2017-04-13
  Filled 2017-04-12: qty 1

## 2017-04-12 MED ORDER — VH HYDROMORPHONE HCL PF 1 MG/ML CARPUJECT
0.50 mg | INTRAMUSCULAR | Status: DC | PRN
Start: 2017-04-12 — End: 2017-04-13
  Administered 2017-04-12 – 2017-04-13 (×7): 1 mg via INTRAVENOUS
  Filled 2017-04-12 (×7): qty 1

## 2017-04-12 NOTE — Progress Notes (Addendum)
Pt brought back up from CT and pt states he did not have his CT scan because he felt that the CT department was not fair. CT department called this nurse and states they had a pt come in from ER and needed CT so pt was put into waiting room and other pt from ER had CT first. CT department said pt had to wait 3 minutes before his CT. Pt got upset and refused to have CT scan.

## 2017-04-12 NOTE — Progress Notes (Signed)
Pt. Is refusing tele. Monitor room and Dr. Minus Liberty notified. Also refusing all BP medications. He states that he wants to go to dyalisis today. He is refusing all vitamins and "unnecessary" medications.

## 2017-04-12 NOTE — Progress Notes (Signed)
Renal Progress Note    Follow up for:  ESRD    Subjective:     Cut his dialysis short last evening; now requesting additional treatment for persistent dyspnea an swelling    Seen on dialysis today  Still awaiting records from patient's previous outpatient dialysis facility     ROS negative for: F/C, N/V/D, pruritis.    Objective:     Vitals: BP 160/90   Pulse (!) 101   Temp (!) 96.2 F (35.7 C) (Axillary)   Resp 17   Ht 1.854 m (6\' 1" )   Wt 111.9 kg (246 lb 9.6 oz)   SpO2 100%   BMI 32.53 kg/m       I/O:     Intake/Output Summary (Last 24 hours) at 04/12/17 1206  Last data filed at 04/11/17 2122   Gross per 24 hour   Intake              240 ml   Output             3200 ml   Net            -2960 ml     Weights:  Wt Readings from Last 1 Encounters:   04/12/17 0500 111.9 kg (246 lb 9.6 oz)   04/11/17 0603 112.7 kg (248 lb 7.3 oz)       Physical Exam:  General: No distress.  Lungs: CTA  Heart: RRR, no murmur, gallop, rub, or heave.  Abdomen: BS present, soft, NT/ND, no organomegaly.  Extremities: No edema.  Access: Left IJ PC site clean and non-tender      Dialysis Treatment Data:  Blood flow rate: 450 mL/min  Dialysate flow rate: 600 mL/min  Ultrafiltration rate: 1410 mL/hr    Dialysate potassium: 2 mEq/L  Dialysate calcium: 2.5 mEq/L    Arterial pressure: -200 mmHg  Venous pressure:      Labs:  Labs reviewed.  Radiology results reviewed.  Pertinent findings below.  Recent Labs      04/12/17   0053  04/11/17   0446   WBC  4.6  5.4  5.1   Hemoglobin  7.9*  7.8*  7.8*   Hematocrit  24.5*  24.0*  24.3*   PLT CT  131  132  130     Recent Labs      04/11/17   0446   PT  10.7   PT INR  1.1     Recent Labs      04/10/17   2038  04/10/17   1613  04/10/17   1009   Troponin I  0.11*  0.13*  0.14*     Recent Labs      04/10/17   1009   B-Natriuretic Peptide  3,149.4*           US Abdomen Limited Single Organ    Result Date: 04/12/2017  FINDINGS/IMPRESSION: Reported but poorly demonstrated small left pleural  effusion. Small ascites in 4 quadrants of abdomen and pelvis. ReadingStation:[id number]     Recent Labs      04/11/17   0446  04/10/17   1009  04/10/17   0536   Glucose  74  71  89  118*   Sodium  140  141  140  143   Potassium  4.9  4.9  4.6  4.3   Chloride  104  105  103  103   CO2  22.2  23.8  22.3  25.0  BUN  50*  53*  47*  43*   Creatinine  8.27*  8.16*  7.56*  7.40*   EGFR  8*  8*  9*  8*   Calcium  7.2*  7.2*  7.5*  7.1*     Recent Labs      04/11/17   0446   Magnesium  1.7   Phosphorus  4.6     Recent Labs      04/11/17   0446  04/10/17   1009  04/10/17   0536   Albumin  2.9*  3.0*  2.9*   Protein, Total  6.6  6.8  6.2   Bilirubin, Total  0.7  0.7  0.5   Alkaline Phosphatase  242*  254*  237*   ALT  14  15  16    AST (SGOT)  24  26  27     No results for input(s): SGUR, LABPH, PROTEINUR, GLUUA, KETONESUA, BILIUA, BLOODUA, NITRITEUA, UROBILIUA, LEUA, NITRITEUA, WBCUA, RBCUA, BACTERIAUA in the last 72 hours.    Invalid input(s):  AMORPHOUSUA               Assessment:     1. 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 SHPTH  4. Exhausted upper extremity vascular access sites  5. Essential HTN  6. Diarrhea, etiology to be determined  7. Hx of CAD  8. Hx of asthma  9. OSA  10. Hx of recurrent C diff colitis  11. Hx of recurrent ascites (from care everywhere review)      Plan:      Stable on HD thus far today. Continue as ordered then resume q MWF schedule   Unable to confirm his previous dialysis regimen and target weight; awaiting outpatient records.   Pt admonished about cutting treatments short.     Will resume usual EPO and activated vitamin D once his current doses can be confirmed with the outpatient unit.     Pt will need to secure a local outpatient chair before he can be cleared for discharge given his intention not to  return to the [place] area.     Cardiac evaluation as per primary and cardiology teams.    Case manager to work on getting him transferred from his home center in [place] to the [facility] center which is his preference.        Orders and Medications reviewed in Epic.  Case discussed with RN.    Cheral Marker, Pager [phone]  12:06 PM  04/12/2017  [facility]  Dr. Kai Levins, Dr. Vallery Sa, Dr. Lajean Saver  Office: [phone]

## 2017-04-12 NOTE — Progress Notes (Signed)
Nutrition Therapy  Nutrition Screen    Patient Information:     Name:[name]   Age: 46 y.o.   Sex: male       MRN: [id number]    Reason For Screen:     MST >/= 2 (weight loss and/or poor oral intake)    Brief Nutrition Note:     Diet cardiac; eating over half of most meals. Patient reports that he has C.diff diarrhea, which has affected his weight and appetite. He denies need for nutrition interventions at this time. Body mass index is 32.53 kg/m.     No nutrition problem at this time. RD to follow per policy, nutritional status change or physician consult.       Nutrition Risk Level: Moderate      Therese Sarah, MS, RD  04/12/2017 12:45 PM

## 2017-04-12 NOTE — Progress Notes (Addendum)
Patient: [name]  Date: 04/12/2017   PCP: Christa See, Jya Hughston  Admission Date: 04/10/2017     Interim Summary:  46 y.o. male from [place] AV was viting his father in this area ;  PMH of Essential HTN , ESRD with chronic anemia with base line Hb ~7.5 on HD; MWF , CAD s/p  2 stents placed post MI in 2010 at [facility] ,OSA was not on CPAP at home  was transferred   from outside facility for higher level of care for SOB   And increasing diarrhea .  Pt reports that he has had increasing diarrhea for one week, and he says it same as the C.Diff that he has had in the past  , he stopped flagyl 2 weeks ago for a bout of C.Diff.   He says he was just discharged from a hospital in [place].   He reports that he has history of MI with stents in 2010 developed chest pressure "like something sitting on my chest" along with shortness of breath prior to reporting to [facility] .   He has also had some abdominal cramping. He gets dialysis M,W,F and he did have dialysis on Friday; 12.21.18   He stated that  He got one unit of PRBC on 12.21.18 after HD  B/o low hb of 6.8   which he says he gets every other weeks for unexplained GI blood loss.    Old chart reviewed on care everywhere :  CTA Chest: and abd done on 12.2.18  Pulmonary arteries are adequately opacified without acute or chronic filling defects. The thoracic aorta is normal in course and caliber without dissection or aneurysm.   The heart size is enlarged. There is a small pericardial effusion. Thoracic lymph nodes are not enlarged. Vascular calcifications are seen.  Bilateral pleural effusions are identified with bibasilar atelectasis, left greater than right. There is no pleural thickening or pneumothorax. The airways are patent.  Abdomen:  A small hiatal hernia is seen. Hepatic steatosis is noted. The pancreas, spleen, adrenal glands, and kidneys otherwise demonstrate no acute pathology.  Bilateral renal atrophy is identified.  Soft tissue anasarca is seen. There is no free air or lymph node enlargement. Abdominal aorta is not aneurysmal.  Pelvis:  There is a large amount of abdominopelvic ascites. Scattered colonic diverticuli are seen. Postoperative changes are seen following ventral hernia repair. There is no bowel wall thickening or obstruction. There is no free fluid. Lymph nodes are not   enlarged. Urinary bladder is unremarkable.  Skeleton:   There are no acute fractures. No suspicious bony lesions.    CMP done on admission: BUN/creatinine 47/7.56  Alkaline phosphatase 254  Normal AST ALT  Normal sodium potassium and bicarbonate  Venous glucose of 89.  CBC: WBC 6.5, H&H 8.3/25.1, platelet count 145.  First troponin 0.13.  EKG findings:  Sinus tachycardia   Probable left atrial enlargement   Borderline prolonged QT interval   Baseline wander in lead(s) V2   No previous ECG available for comparison   QTc 493     Cardiology and nephrology consulted .     Pt is noncompliant to medical recommendation . Pt is self decitating his care while as in pt service .   TTE done on 12.24.18   LVEF of at 45-50%. There is grade II diastolic dysfunction of the left ventricle   tricuspid regurgitation is mild to moderate.    estimated pulmonary arterial pressure is mildly elevated.  Subjective : Abdominal pain  Refused antihypertensives  Asking for narcotics        Assessment and Plan:     Chest pain, chest tightness and SOB in context of history of CAD and status post stent placed in 2010   Twelve-lead EKG no acute ST T changes   Troponins ; 0.14> 0.13>0.11   cardiologist follow up appreciated   Transthoracic echocardiogram   Heparin drip was not initiated because patient has unexplained blood loss requiring blood transfusion every other week after dialysis as per patient.  Continue beta blocker  Continue statin  Patient had anaphylaxis with NSAIDs  We'll defer antiplatelet management to cardiologist.     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    Recurrent f Clostridium difficile associated diarrhea  Completed 14 day course of oral vancomycin and Flagyl  Cont  Dificid started on 12.23.18   Contact isolation  X-ray abdomen No definite acute process in the abdomen.  Complaining of worsening abdominal pain without any worsening diarrhea or fever  CT abdomen pelvis ordered  Discussed with patient about the plan  Diet as tolerated     Chronic anemia  Iron panel AOCD   Out pt GI eval         Essential hypertension  Uncontrolled   Increased coreg to 25 mg bid today ; patient Refusing antihypertensives  Amlodipine 10 mg daily   Hydralazine 100 mg po TID     Additional PRN hydralazine.  avoid hypotension     OSA   Cont CPAP at night .  O2 as needed to keep Spo2 >91%  Pt doesn't have a CPAP machine at home   Requested  to arrange for CPAP machine at home.      Medical noncompliance    CODE STATUS: Full code  No emergency contact documented.    DVT prophylaxis: Currently on not on chemical DVT prophylaxis because of low H&H  Reported unexplained blood loss by the patient    Disposition: cardiac work up in progress .  Stress test tomorrow   Pt had refused Stress test on 12.24.18  Cont abx for CDAD         LOS 2 Days  Daleena Rotter Otho Ket, Shaquna Geigle    04/12/2017 1:30 PM    Please contact at phone number [phone]  if any questions.      Objective   Exam: Patient is awake, alert and oriented x 3  Getting dialysis  HEENT: PERRLA, EOMI  Neck: supple, without JVD  Chest: CTA bilaterally. No crackles or wheezing.  CVS: S1, S2 regular rate and rhythm  no murmurs    Abdomen: soft and moderate tenderness without any rigidity   with good bowel sounds.  Musculoskeletal:pitting edema, pulses palpable  Skin:  Warm dry no worrisome lesions  NEURO:  Cranial nerve II-12 intact no motor or sensory deficits  Psychiatric: alert, interactive, appropriate, normal affect.    I/O last 3 completed shifts:  In: 720 [P.O.:720]  Out: 4200 [Urine:1000; Other:3200]  Vitals:    04/12/17 0000 04/12/17 0500 04/12/17 0756 04/12/17 1213   BP: 168/84  160/90 (!) 178/100   Pulse:   (!) 101 95   Resp:   17 18   Temp: 97.3  F (36.3 C)  (!) 96.2 F (35.7 C) 97.1 F (36.2 C)   TempSrc: Oral  Axillary Axillary   SpO2: 96%  100% 98%   Weight:  111.9 kg (246 lb 9.6 oz)     Height:         Wt Readings from Last 4 Encounters:   04/12/17 111.9 kg (246 lb 9.6 oz)   04/10/17 111.1 kg (245 lb)       Current Facility-Administered Medications   Medication Dose Route Frequency   . amLODIPine  10 mg Oral Daily   . atorvastatin  80 mg Oral QHS   . B complex-vitamin C-folic acid  1 tablet Oral Daily   . carvedilol  12.5 mg Oral Q12H SCH   . docusate sodium  100 mg Oral Daily   . epoetin alfa  6,000 Units Intravenous Mon-Wed-Fri   . fidaxomicin  200 mg Oral BID   . folic acid  1 mg Oral Daily   . heparin (porcine)  5,000 Units Subcutaneous Q8H SCH   . hydrALAZINE  100 mg Oral TID   . lactobacillus species  50 Billion CFU Oral Daily   . pantoprazole  40 mg Oral Daily   . senna-docusate  2 tablet Oral QHS   . sevelamer  800 mg Oral TID MEALS   . sodium chloride (PF)  3 mL Intravenous Q8H                Recent Labs  Lab 04/12/17  0053 04/11/17  0446   WBC 4.6 5.4  5.1   RBC 2.56* 2.48*  2.53*   Hemoglobin 7.9* 7.8*  7.8*   Hematocrit 24.5* 24.0*  24.3*   MCV 96 97  96   PLT CT 131 132  130       Recent Labs  Lab 04/11/17  0446   PT 10.7   PT INR 1.1       Recent Labs  Lab 04/10/17  2038 04/10/17  1613 04/10/17  1009   Troponin I 0.11* 0.13* 0.14*     No results found for: [id number]   Recent Labs  Lab 04/11/17  0446 04/10/17  1009 04/10/17  0536   Glucose 74  71 89 118*   Sodium 140  141 140 143   Potassium 4.9  4.9 4.6 4.3   Chloride 104  105 103 103    CO2 22.2  23.8 22.3 25.0   BUN 50*  53* 47* 43*   Creatinine 8.27*  8.16* 7.56* 7.40*   EGFR 8*  8* 9* 8*   Calcium 7.2*  7.2* 7.5* 7.1*       Recent Labs  Lab 04/11/17  0446 04/10/17  1009   Magnesium 1.7  --    Phosphorus 4.6  --    Albumin 2.9* 3.0*   Protein, Total 6.6 6.8   Bilirubin, Total 0.7 0.7   Alkaline Phosphatase 242* 254*   ALT 14 15   AST (SGOT) 24 26           Invalid input(s):  AMORPHOUSUA   Xr Abdomen Ap    Result Date: 04/10/2017  No definite acute process in the abdomen. Chronic pleural thickening versus small bilateral effusions ReadingStation:[id number]    Xr Chest Ap Portable    Result Date: 04/10/2017  Minimal pulmonary edema. ReadingStation:[id number]    US Abdomen Limited Single Organ    Result Date: 04/12/2017  FINDINGS/IMPRESSION: Reported but poorly demonstrated small left pleural effusion. Small ascites in 4  quadrants of abdomen and pelvis. ReadingStation:[id number]    Labs and radiology reports have been reviewed by me  MRN: [id number]

## 2017-04-12 NOTE — Progress Notes (Signed)
[facility]  Progress Note    Date Time: 04/12/17 10:48 AM  Patient Name: [name]    Subjective:   C/o continue chest and abdominal discomfort  Patient refusing things- BP meds, NTG, tele, left HD early  He wants more HD today due to edema    Physical Exam:     Visit Vitals  BP 160/90   Pulse (!) 101   Temp (!) 96.2 F (35.7 C) (Axillary)   Resp 17   Ht 1.854 m (6\' 1" )   Wt 111.9 kg (246 lb 9.6 oz)   SpO2 100%   BMI 32.53 kg/m     General/Constitutional: no acute distress  HEENT: moist mucous membranes  Neck: supple, no JVD  Heart/Cardiovascular:   RRR  no murmurs/rubs/gallops  1+ radial pulses  Respiratory/Lungs: non-labored, clear to auscultation throughout  Abdominal/Gastrointestinal: soft, nontender  Extremities: warm and well-perfused, 2+ LE edema, failed UE fistulae, +port  Skin: no rash  Neurological: alert and oriented x3, grossly nonfocal  Psych: calm    Medications:     Current Facility-Administered Medications   Medication Dose Route Frequency   . amLODIPine  10 mg Oral Daily   . atorvastatin  80 mg Oral QHS   . B complex-vitamin C-folic acid  1 tablet Oral Daily   . carvedilol  12.5 mg Oral Q12H SCH   . docusate sodium  100 mg Oral Daily   . epoetin alfa  6,000 Units Intravenous Mon-Wed-Fri   . fidaxomicin  200 mg Oral BID   . folic acid  1 mg Oral Daily   . heparin (porcine)  5,000 Units Subcutaneous Q8H SCH   . hydrALAZINE  100 mg Oral TID   . lactobacillus species  50 Billion CFU Oral Daily   . pantoprazole  40 mg Oral Daily   . senna-docusate  2 tablet Oral QHS   . sevelamer  800 mg Oral TID MEALS   . sodium chloride (PF)  3 mL Intravenous Q8H     Prior to Admission medications    Medication Sig Start Date End Date Taking? Authorizing Provider   albuterol (PROVENTIL HFA;VENTOLIN HFA) 108 (90 Base) MCG/ACT inhaler Inhale 2 puffs into the lungs every 4 (four) hours as needed for Shortness of Breath.   Yes [provider]   amLODIPine (NORVASC) 10 MG tablet Take 10 mg by mouth  daily.   Yes [provider]   carvedilol (COREG) 12.5 MG tablet Take 12.5 mg by mouth.   Yes [provider]   hydrALAZINE (APRESOLINE) 100 MG tablet Take 100 mg by mouth 3 (three) times daily.   Yes [provider]   sevelamer (RENAGEL) 800 MG tablet Take 800 mg by mouth 3 (three) times daily with meals.   Yes [provider]   albuterol-ipratropium (COMBIVENT RESPIMAT) 20-100 MCG/ACT Aero Soln Inhale 1 puff into the lungs 4 (four) times daily.    [provider]         Labs and studies reviewed    Problem List:   Active Problems:    Unstable angina       Assessment:     [name] is 46 y.o. male with    1. CAD s/p 2 stents (2010)   2. HTN  3. ESRD  4. OSA  5. End stage renal disease on dialysis   6. Elevated troponins (flat) and CP  7. Anemia    Plan:     1. His anemia seems to be  ACD rather than true iron deficiency; recommend evaluation per primary service and consideration of endoscopic evaluation as it seems out of proportion to his ESRD  2. Will do SPECT tomorrow as his pain is atypical (continuous with flat troponins) and he has been blatantly non-complicant while here    Thank you for consulting [facility] and allowing [place] to participate in this pt's care! We will follow along while the pt is in the [name].    Seward Speck, MD, [name], RPVI  Interventional Cardiology  [facility]  [address]  [place], [place] [address]  Tel [phone]  Fax [phone]

## 2017-04-12 NOTE — Plan of Care (Signed)
Problem: Moderate/High Fall Risk Score >5  Goal: Patient will remain free of falls  Outcome: Progressing

## 2017-04-12 NOTE — Progress Notes (Signed)
Dialysis Treatment Note:  Dialysis treatment under the service of Dr. Milagros Loll.  Patient completed 2.5 hours of hemodialysis via (L) CVC using a 2K, 2.5 Ca bath for a net UF of 3 liters.  Patient developed some cramping, but refused NS bolus and a decrease in UFG.  Both limbs of CVC hep locked as per protocol.      Patient:  [name] MRN#:  [id number]  Unit/Room/Bed:  332/332-A   Isolation: Contact Special       Allergies: Allergies   Allergen Reactions   . Lisinopril Anaphylaxis   . Morphine Anaphylaxis     Quite breathing   . Toradol [Ketorolac Tromethamine] Anaphylaxis     Code Status: Full Code    Problem List:   Patient Active Problem List    Diagnosis Date Noted   . Unstable angina 04/10/2017       Dialysis Order:  Active Orders   Dialysis    Hemodialysis inpatient     Frequency: Once     Start Date/Time: 04/13/17 0000     Number of Occurrences:  1 Occurrences     Order Questions:     . Date of Dialysis 04/13/2017     . K+ (initial) 2 mEq     . KPP (Per Protocol) Yes     . Ca++ 2.5 mEq     . Bicarb 35 mEq     . Na+ 138 mEq     . Dialyzer ReviClear     . Dialysate Temperature (C) 37     . BFR-As tolerated to a maximum of: 450 mL/min     . DFR 600 mL/min     . Duration of Treatment 4 Hours     . Fluid Removal (L) and Dry Weight (Kg) Other (please specify) (Comment: 3.5 liters)     . Limit Ultrafiltration Rate Yes     . Specify mL/hr/kg 13     . Access Type-Location Tunneled-L    Hemodialysis inpatient     Frequency: Once     Start Date/Time: 04/12/17 1210     Number of Occurrences:  1 Occurrences     Order Questions:     . Date of Dialysis 04/12/2017     . K+ (initial) 2 mEq     . KPP (Per Protocol) Yes     . Ca++ 2.5 mEq     . Bicarb 35 mEq     . Na+ 138 mEq     . Dialyzer ReviClear     . Dialysate Temperature (C) 37     . BFR-As tolerated to a maximum of: 450 mL/min     . DFR 600 mL/min     . Duration of Treatment Other (please specify) (Comment: 2.5 hours)     . Fluid Removal (L) and Dry Weight (Kg)  3L     . Limit Ultrafiltration Rate No     . Access Type-Location Tunneled-L      Dialysis Treatment Type:   04/12/17 1410   Treatment Initiation- With Dialysis Precautions   Dialysis Treatment Type Routine;Acute Room  (1:1)       Time out/Safety Check: Time Out/Safety Check Completed: Yes (04/12/17 1410)  [facility] Consent: Consent for HD signed for this hospitalization: Yes (04/12/17 1410)  Blood Consent (if applicable): Blood Consent Verified: Not Applicable (04/12/17 1410)    Dialysis Access:     04/12/17 1410   Permacath/Temporary Catheter Permacath Left   No Placement Date or Time found.  Present on Admission?: Yes  Access Type: Permacath  Orientation: Left  Access Location: Tunneled, Chest   Line necessity reviewed? Dialysis   Catheter Lumen Volume Venous 2.2 mL   Catheter Lumen Volume Arterial 2.2 mL   Dressing Status and Intervention Dressing Intact;Clean & Dry   Tego Caps on Catheter No   End Caps Free From Blood Yes   Dressing Type Antimicrobial impregnated   Dressing Status Clean;Dry;Intact   Biopatch Used? ([facility] Only) N/A ([facility] Only)   Line Used For Blood Draw Yes  ([facility] only)   Dressing Change Due 04/18/17       Permacath/Temporary Catheter Permacath Left (Active)   Line necessity reviewed? Dialysis 04/12/2017  2:10 PM   Catheter Lumen Volume Venous 2.2 mL 04/12/2017  2:10 PM   Catheter Lumen Volume Arterial 2.2 mL 04/12/2017  2:10 PM   Dressing Status and Intervention Dressing Intact;Antibacterial Disc Applied 04/12/2017  5:00 PM   Tego Caps on Catheter No 04/12/2017  2:10 PM   NEW Tego Caps placed (Date) 04/11/17 04/11/2017  7:00 PM   End Caps Free From Blood Yes 04/12/2017  5:00 PM   Line Care Connections checked and tightened 04/12/2017  5:00 PM   Dressing Type Antimicrobial impregnated 04/12/2017  5:00 PM   Dressing Status Clean;Dry;Intact 04/12/2017  5:00 PM   Dressing/Line Intervention Caps changed 04/12/2017  5:00 PM   Biopatch Used? ([facility] Only) N/A ([facility] Only) 04/12/2017  2:10 PM   Line  Used For Blood Draw Yes 04/12/2017  2:10 PM   Dressing Change Due 04/18/17 04/12/2017  5:00 PM         General Assessments:       04/12/17 1410   Vitals   Temp 98.2 F (36.8 C)   Heart Rate 95   Resp Rate 19   BP (!) 159/98   SpO2 100 %   O2 Device None (Room air)   Assessment   Mental Status Alert;Oriented;Cooperative   Cardiac (WDL) X   Cardiac Regularity Regular   Cardiac Symptoms None   Cardiac Rhythm Sinus Tach;Normal Sinus Rhythm   Respiratory  X   Respiratory Pattern Regular;Dyspnea with exertion   Bilateral Breath Sounds Clear;Diminished   Generalized Edema Non Pitting Edema   RLE Edema +2   LLE Edema +2   General Skin Color Appropriate for ethnicity   Skin Condition/Temp Warm;Dry   Gastrointestinal (WDL) X   Abdomen Inspection Distended;Ascites   GI Symptoms None   Mobility Bed        04/12/17 1700   Vitals   Temp 98.2 F (36.8 C)   Heart Rate (!) 107   Resp Rate 18   BP (!) 162/109   Assessment   Mental Status Alert;Oriented;Cooperative   Cardiac (WDL) X   Cardiac Regularity Regular   Cardiac Symptoms None   Cardiac Rhythm Normal Sinus Rhythm;Sinus Tach   Respiratory Pattern Regular;Dyspnea with exertion   Bilateral Breath Sounds Clear;Diminished   Edema  X   Generalized Edema Non Pitting Edema   RLE Edema +2   LLE Edema +2   General Skin Color Appropriate for ethnicity   Skin Condition/Temp Warm;Dry   Gastrointestinal (WDL) X   Abdomen Inspection Distended;Ascites   GI Symptoms None   Mobility Bed     Pain Assessment: Pain Assessment  Charting Type: Assessment (04/12/17 1410)  GWN Pain Level (Read Only): 8 (04/12/17 0908)  Pain Scale Used: Numeric Scale (0-10) (04/12/17 1410)    Labs Values:  HBsAg Result:HBsAg (Antigen) Result: Negative (  04/12/17 1410)  HBsAg Date Drawn: HBsAg Date Drawn: 04/11/17 (04/12/17 1410)  HBsAg Next Due Date:HBsAg Repeat Draw Due Date: 05/10/17 (04/12/17 1410)      04/12/17 1410   Hepatitis Status   HBsAb (Antibody) Result (Unknown)     Lab Results   Component Value Date     BUN 50 (H) 04/11/2017    BUN 53 (H) 04/11/2017    NA 140 04/11/2017    NA 141 04/11/2017    K 4.9 04/11/2017    K 4.9 04/11/2017    CREAT 8.27 (H) 04/11/2017    CREAT 8.16 (H) 04/11/2017    CO2 22.2 04/11/2017    CO2 23.8 04/11/2017    CA 7.2 (L) 04/11/2017    CA 7.2 (L) 04/11/2017    PHOS 4.6 04/11/2017    GLU 74 04/11/2017    GLU 71 04/11/2017    ALB 2.9 (L) 04/11/2017    HGB 7.9 (L) 04/12/2017    HCT 24.5 (L) 04/12/2017    WBC 4.6 04/12/2017    PLT 131 04/12/2017    PT 10.7 04/11/2017    INR 1.1 04/11/2017         Current Diet Order      Diet cardiac Na restriction, if any: NO ADDED SALT; Fluid restriction: 1500 ML FLUID      Diet NPO time specified Except for: SIPS WITH MEDS     RO/Hemodialysis Machine Safety Checks - Before Each Treatment:  RO/Hemodialysis Dance movement psychotherapist Number: [id number] (04/12/17 1410)  RO #: [id number] (04/12/17 1410)  Water Hardness: 0 (04/12/17 1410)  pH: 7.4 (04/12/17 1410)  Pressure Test Verified: Yes (04/12/17 1410)  Alarms Verified: Passed (04/12/17 1410)  Machine Temperature: 98.6 F (37 C) (04/12/17 1410)  Alarms Verified: Yes (04/12/17 1410)  Na+ mEq (Machine): 138 mEq (04/12/17 1410)  Bicarb mEq (Machine): 35 mEq (04/12/17 1410)  Hemodialysis Conductivity (Machine): 14 (04/12/17 1410)  Hemodialysis Conductivity (Meter): 14 (04/12/17 1410)  Dialyzer Lot Number: [id number] (04/12/17 1410)  Tubing Lot Number: [id number] (04/12/17 1410)  RO Machine Log Completed: Yes (04/12/17 1410)    Chlorine Testing:  RO/Hemodialysis Machine Safety Checks  Is Total Chlorine less than 0.1 ppm?: Yes (04/12/17 1410)  Orignial Total Chlorine Testing Time: 1300 (04/12/17 1410)  At 4 Hour Total Chlorine Testing Time: 1700 (04/12/17 1410)    Vitals and Weight:  Patient Vitals for the past 4 hrs:   BP Temp Pulse Resp   04/12/17 1700 (!) 162/109 98.2 F (36.8 C) (!) 107 18   04/12/17 1650 (!) 167/111 - (!) 109 -   04/12/17 1630 (!) 172/109 - (!) 108 -   04/12/17 1615 (!) 174/100 - 98 -   04/12/17 1600  (!) 180/100 - 86 -   04/12/17 1545 (!) 183/102 - 84 -   04/12/17 1530 (!) 165/105 - 98 -   04/12/17 1515 (!) 176/99 - 91 -   04/12/17 1500 (!) 183/98 - 90 -   04/12/17 1445 (!) 143/95 - 94 -   04/12/17 1430 (!) 176/94 - 93 -   04/12/17 1415 (!) 150/100 - 91 -   04/12/17 1410 (!) 159/98 98.2 F (36.8 C) 95 19        Dialysis Weight  Pre-Treatment Weight (Kg): 111.9 (04/12/17 1410)  Scale Type: ICU Bed Scale (04/11/17 1430)  Post-Treatment Weight (Kg): 108.9 (04/12/17 1700)    Treatment:   04/12/17 1410 04/12/17 1415 04/12/17 1430   Vitals   Temp 98.2 F (36.8 C) --  --  Heart Rate 95 91 93   Resp Rate 19 --  --    BP (!) 159/98 (!) 150/100 (!) 176/94   SpO2 100 % --  --    O2 Device None (Room air) --  --    Animal nutritionist --  Yes --    Blood Flow Rate (mL/min) --  450 mL/min 450 mL/min   Arterial Pressure (mmHg) --  -150 mmHg -160 mmHg   Venous Pressure (mmHg) --  160 150   Dialysate Flow Rate (mL/min) --  600 mL/min 600 mL/min   Transmembrane Pressure (mmHg) --  120 mmHg 120 mmHg   Ultrafiltration Rate (mL/Hr) --  1400 mL/hr 1400 mL/hr   Fluid Removal (ml) --  0 ml 350 ml   Fluid Bolus (ml) --  250 ml --    Dialysate K (mEq/L) --  2 mEq/L --    Dialysate CA (mEq/L) --  2.5 mEq/L --    Hemodialysis Comments   Arteriovenous Lines Secure --  Yes Yes   Comments Time out performed using ICEBOAT as per [facility] policy.  Verbal and written consent verified.  Pre-tx vital signs.   Treatment initiated via (L) CVC with bilateral hemaclips in place. CPAP on at patient's request.         04/12/17 1445 04/12/17 1500 04/12/17 1515   Vitals   Temp --  --  --    Heart Rate 94 90 91   Resp Rate --  --  --    BP (!) 143/95 (!) 183/98 (!) 176/99   SpO2 --  --  --    O2 Device --  --  --    Animal nutritionist --  --  --    Blood Flow Rate (mL/min) 450 mL/min 450 mL/min 450 mL/min   Arterial Pressure (mmHg) -170 mmHg -170 mmHg -170 mmHg   Venous Pressure (mmHg)  160 160 160   Dialysate Flow Rate (mL/min) 600 mL/min 600 mL/min 600 mL/min   Transmembrane Pressure (mmHg) 120 mmHg 120 mmHg 120 mmHg   Ultrafiltration Rate (mL/Hr) 1400 mL/hr 1400 mL/hr 1400 mL/hr   Fluid Removal (ml) 688 ml 1038 ml 1390 ml   Fluid Bolus (ml) --  --  --    Dialysate K (mEq/L) --  --  --    Dialysate CA (mEq/L) --  --  --    Hemodialysis Comments   Arteriovenous Lines Secure Yes Yes Yes   Comments Pt. stable. Pt. took CPAP off. Pt. c/o of leg cramps.  Refused NS bolus, and refused to decrease goal.  Patient sitting on side of  bed with legs hanging.         04/12/17 1530 04/12/17 1545 04/12/17 1600   Vitals   Temp --  --  --    Heart Rate 98 84 86   Resp Rate --  --  --    BP (!) 165/105 (!) 183/102 (!) 180/100   SpO2 --  --  --    O2 Device --  --  --    Animal nutritionist --  --  --    Blood Flow Rate (mL/min) 450 mL/min 450 mL/min 450 mL/min   Arterial Pressure (mmHg) -190 mmHg -180 mmHg -200 mmHg   Venous Pressure (mmHg) 170 170 160   Dialysate Flow Rate (mL/min) 600 mL/min 600 mL/min 600 mL/min   Transmembrane Pressure (mmHg) 120 mmHg 120 mmHg 120  mmHg   Ultrafiltration Rate (mL/Hr) 1400 mL/hr 1410 mL/hr 1410 mL/hr   Fluid Removal (ml) 1740 ml 2036 ml 2390 ml   Fluid Bolus (ml) --  --  --    Dialysate K (mEq/L) --  --  --    Dialysate CA (mEq/L) --  --  --    Hemodialysis Comments   Arteriovenous Lines Secure Yes Yes Yes   Comments Pt. states cramping improved.  Lying back in bed. Pt. resting comfortably. Pt. resting comfortably.       04/12/17 1615 04/12/17 1630 04/12/17 1650   Vitals   Temp --  --  --    Heart Rate 98 (!) 108 (!) 109   Resp Rate --  --  --    BP (!) 174/100 (!) 172/109 (!) 167/111   SpO2 --  --  --    O2 Device --  --  --    Animal nutritionist --  --  --    Blood Flow Rate (mL/min) 450 mL/min 450 mL/min 450 mL/min   Arterial Pressure (mmHg) -190 mmHg -200 mmHg -200 mmHg   Venous Pressure (mmHg) 160 160 160    Dialysate Flow Rate (mL/min) 600 mL/min 600 mL/min 600 mL/min   Transmembrane Pressure (mmHg) 120 mmHg 120 mmHg 120 mmHg   Ultrafiltration Rate (mL/Hr) 1410 mL/hr 1410 mL/hr 1410 mL/hr   Fluid Removal (ml) 2752 ml 3098 ml 3500 ml   Fluid Bolus (ml) --  --  250 ml   Dialysate K (mEq/L) --  --  --    Dialysate CA (mEq/L) --  --  --    Hemodialysis Comments   Arteriovenous Lines Secure Yes Yes Yes   Comments Pt. resting comfortably. No changes Treatment completed, blood returned.         04/12/17 1700   Vitals   Temp 98.2 F (36.8 C)   Heart Rate (!) 107   Resp Rate 18   BP (!) 162/109   Hemodialysis Comments   Comments Verbal Report given to a. Krampetz, RN-post tx vital signs.        04/12/17 1700   Treatment Summary   Time Off Machine 1650   Duration of Treatment (Hours) 2.5   Dialyzer Clearance Lightly streaked   Fluid Volume Off (mL) 3500   Prime Volume (mL) 250   Rinseback Volume (mL) 250   Fluid Given: Normal Saline (mL) 0   Fluid Given: PRBC  0 mL   Fluid Given: Albumin (mL) 0   Fluid Given: Other (mL) 0   Total Fluid Given 500   Hemodialysis Net Fluid Removed 3000   Post Treatment Assessment   Post-Treatment Weight (Kg) 108.9   Patient Response to Treatment Cramping.    Additional Dialyzer Used 0      Intake/Output:  I/O this shift:  In: -   Out: 3000 [Other:3000]      Medications:  Current Facility-Administered Medications   Medication Dose Route Last Rate Last Dose        Education:  Education  Person taught: Patient (04/12/17 1410)  Knowledge basis: Substantial (04/12/17 1410)  Topics taught: Procedure;Fluid Management (04/12/17 1410)  Teaching Tools: Explain (04/11/17 1900)  Patty SermonsTrenton Gammon Understanding (04/12/17 1410)    Primary Nurse Communication:  Bedside Nurse Communication  Name of bedside RN - pre dialysis: Demetrio Lapping, RN (04/12/17 1400)  Name of bedside RN - post dialysis: Demetrio Lapping, RN (04/12/17 1700)      [facility] nurse signature/date/time:  Demetry Bendickson L. Monroe, RN  04/12/2017 @  [phone]

## 2017-04-13 ENCOUNTER — Inpatient Hospital Stay: Payer: Medicare Other

## 2017-04-13 ENCOUNTER — Inpatient Hospital Stay
Admission: EM | Admit: 2017-04-13 | Discharge: 2017-04-14 | DRG: 371 | Discharge status: Against Medical Advice | Payer: Self-pay | Attending: Hospice and Palliative Medicine | Admitting: Hospice and Palliative Medicine

## 2017-04-13 DIAGNOSIS — I252 Old myocardial infarction: Secondary | ICD-10-CM

## 2017-04-13 DIAGNOSIS — Z885 Allergy status to narcotic agent status: Secondary | ICD-10-CM

## 2017-04-13 DIAGNOSIS — I120 Hypertensive chronic kidney disease with stage 5 chronic kidney disease or end stage renal disease: Secondary | ICD-10-CM | POA: Diagnosis present

## 2017-04-13 DIAGNOSIS — R079 Chest pain, unspecified: Secondary | ICD-10-CM | POA: Diagnosis present

## 2017-04-13 DIAGNOSIS — N186 End stage renal disease: Secondary | ICD-10-CM | POA: Diagnosis present

## 2017-04-13 DIAGNOSIS — F17210 Nicotine dependence, cigarettes, uncomplicated: Secondary | ICD-10-CM | POA: Diagnosis present

## 2017-04-13 DIAGNOSIS — Z2889 Immunization not carried out for other reason: Secondary | ICD-10-CM

## 2017-04-13 DIAGNOSIS — R1030 Lower abdominal pain, unspecified: Secondary | ICD-10-CM

## 2017-04-13 DIAGNOSIS — J45909 Unspecified asthma, uncomplicated: Secondary | ICD-10-CM | POA: Diagnosis present

## 2017-04-13 DIAGNOSIS — Z888 Allergy status to other drugs, medicaments and biological substances status: Secondary | ICD-10-CM

## 2017-04-13 DIAGNOSIS — A0472 Enterocolitis due to Clostridium difficile, not specified as recurrent: Principal | ICD-10-CM | POA: Diagnosis present

## 2017-04-13 DIAGNOSIS — G4730 Sleep apnea, unspecified: Secondary | ICD-10-CM | POA: Diagnosis present

## 2017-04-13 DIAGNOSIS — R109 Unspecified abdominal pain: Secondary | ICD-10-CM | POA: Diagnosis present

## 2017-04-13 DIAGNOSIS — R778 Other specified abnormalities of plasma proteins: Secondary | ICD-10-CM

## 2017-04-13 DIAGNOSIS — Z7951 Long term (current) use of inhaled steroids: Secondary | ICD-10-CM

## 2017-04-13 DIAGNOSIS — Z992 Dependence on renal dialysis: Secondary | ICD-10-CM

## 2017-04-13 DIAGNOSIS — R748 Abnormal levels of other serum enzymes: Secondary | ICD-10-CM | POA: Diagnosis present

## 2017-04-13 DIAGNOSIS — Z8261 Family history of arthritis: Secondary | ICD-10-CM

## 2017-04-13 DIAGNOSIS — R112 Nausea with vomiting, unspecified: Secondary | ICD-10-CM

## 2017-04-13 DIAGNOSIS — Z8249 Family history of ischemic heart disease and other diseases of the circulatory system: Secondary | ICD-10-CM

## 2017-04-13 DIAGNOSIS — I2510 Atherosclerotic heart disease of native coronary artery without angina pectoris: Secondary | ICD-10-CM | POA: Diagnosis present

## 2017-04-13 LAB — CBC
Hematocrit: 22.1 % — ABNORMAL LOW (ref 39.0–52.5)
Hemoglobin: 7.5 gm/dL — ABNORMAL LOW (ref 13.0–17.5)
MCH: 33 pg (ref 28–35)
MCHC: 34 gm/dL (ref 32–36)
MCV: 97 fL (ref 80–100)
MPV: 6.9 fL (ref 6.0–10.0)
PLT CT: 122 10*3/uL — ABNORMAL LOW (ref 130–440)
RBC: 2.29 10*6/uL — ABNORMAL LOW (ref 4.00–5.70)
RDW: 14.7 % — ABNORMAL HIGH (ref 11.0–14.0)
WBC: 4.5 10*3/uL (ref 4.0–11.0)

## 2017-04-13 LAB — BASIC METABOLIC PANEL
Anion Gap: 13.8 mMol/L (ref 7.0–18.0)
BUN / Creatinine Ratio: 6.8 Ratio — ABNORMAL LOW (ref 10.0–30.0)
BUN: 36 mg/dL — ABNORMAL HIGH (ref 7–22)
CO2: 26.9 mMol/L (ref 20.0–30.0)
Calcium: 8.1 mg/dL — ABNORMAL LOW (ref 8.5–10.5)
Chloride: 102 mMol/L (ref 98–110)
Creatinine: 5.32 mg/dL — ABNORMAL HIGH (ref 0.80–1.30)
EGFR: 14 mL/min/{1.73_m2} — ABNORMAL LOW (ref 60–150)
Glucose: 73 mg/dL (ref 71–99)
Osmolality Calc: 283 mOsm/kg (ref 275–300)
Potassium: 4.7 mMol/L (ref 3.5–5.3)
Sodium: 138 mMol/L (ref 136–147)

## 2017-04-13 MED ORDER — HYDROMORPHONE HCL 0.5 MG/0.5 ML IJ SOLN
0.50 mg | Freq: Once | INTRAMUSCULAR | Status: AC
Start: 2017-04-13 — End: 2017-04-14
  Administered 2017-04-14: 01:00:00 0.5 mg via INTRAVENOUS

## 2017-04-13 MED ORDER — ONDANSETRON HCL 4 MG/2ML IJ SOLN
4.00 mg | Freq: Once | INTRAMUSCULAR | Status: AC
Start: 2017-04-13 — End: 2017-04-14
  Administered 2017-04-14: 01:00:00 4 mg via INTRAVENOUS

## 2017-04-13 MED ORDER — FIDAXOMICIN 200 MG PO TABS
200.00 mg | ORAL_TABLET | Freq: Two times a day (BID) | ORAL | Status: DC
Start: 2017-04-14 — End: 2017-04-14
  Administered 2017-04-14: 01:00:00 200 mg via ORAL
  Filled 2017-04-13 (×3): qty 1

## 2017-04-13 MED ORDER — SODIUM CHLORIDE 0.9 % IV BOLUS
500.00 mL | Freq: Once | INTRAVENOUS | Status: DC
Start: 2017-04-13 — End: 2017-04-14

## 2017-04-13 MED ORDER — VH BIO-K PLUS PROBIOTIC 50 BIL CFU CAPSULE
50.00 | DELAYED_RELEASE_CAPSULE | Freq: Every day | ORAL | Status: DC
Start: 2017-04-14 — End: 2017-04-14

## 2017-04-13 MED ORDER — VH HYDROMORPHONE HCL PF 1 MG/ML CARPUJECT
0.50 mg | Freq: Four times a day (QID) | INTRAMUSCULAR | Status: DC | PRN
Start: 2017-04-13 — End: 2017-04-13

## 2017-04-13 NOTE — Discharge Summary (Signed)
[facility]      Patient: [name]  Admission Date: 04/10/2017   DOB: 12/01/1969  Discharge Date: 04/13/2017    MRN: [id number]  Discharge Attending: Joaquin Knebel Heriberto Antigua ,Ilisha Blust   Referring Physician: Christa See, Levis Nazir  PCP: Christa See, Johnryan Sao       DISCHARGE SUMMARY     Discharge Information   Discharge Diagnoses:     SIGNED AMA   Chest pain, chest tightnessand SOBin context of history of CAD and status post stent placed in 2010   Twelve-lead EKG no acute ST T changes   Troponins ; 0.14> 0.13>0.11  ON  beta blocker  ON  statin  Patient refused stress test twice including one today      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    Recurrent f Clostridium difficile associated diarrhea  Completed 14 day course of oral vancomycin and Flagyl  Cont  Dificid started on 12.23.18   Contact isolation  Tolerating diet well  Refused CT scan despite explaining the necessity and he verbalized understanding of        Chronic anemia  Iron panel AOCD   Out pt GI eval         Essential hypertension  Uncontrolled   Patient is adamantly refusing to take medications before dialysis despite reassuring and confirmed the high blood pressure  He KEPT  doing it ever since he got admitted  noncompliant    OSA   Cont CPAP at night .  O2 as needed to keep Spo2 >91%  Pt doesn't have a CPAP machine at home   Requested  to arrange for CPAP machine at home.    Mild thrombo-cytopenia        Medical noncompliance              Discharge Medications:     Medication List      ASK your doctor about these medications    albuterol 108 (90 Base) MCG/ACT inhaler  Commonly known as:  PROVENTIL HFA;VENTOLIN HFA     albuterol-ipratropium 20-100 MCG/ACT Aers  Commonly known as:  COMBIVENT RESPIMAT      amLODIPine 10 MG tablet  Commonly known as:  NORVASC     carvedilol 12.5 MG tablet  Commonly known as:  COREG     hydrALAZINE 100 MG tablet  Commonly known as:  APRESOLINE     sevelamer 800 MG tablet  Commonly known as:  RENAGEL                Hospital Course   History of Present Illness and Hospital Course (3 Days)     [name] is a 46 y.o. male  from [place] AV was viting his father in this area ; PMH of Essential HTN , ESRD with chronic anemia with base line Hb ~7.5 on HD; MWF , CAD s/p 2 stents placed post MI in 2010 at [facility] ,OSA was not on CPAP at home  was transferred   from outside facility for higher level of care for SOB   And increasing diarrhea .  Pt reports that he has had increasing diarrhea for one week, and he says it same as the C.Diff that he has had in the past  , he stopped  flagyl 2 weeks ago for a bout of C.Diff.   He says he was just discharged from a hospital in [place].   He reports that he has history of MI with stents in 2010 developed chest pressure "like something sitting on my chest" along with shortness of breath prior to reporting to [facility] .   He has also had some abdominal cramping. He gets dialysis M,W,F and he did have dialysis on Friday; 12.21.18  He stated that He got one unit of PRBC on 12.21.18 after HD B/o low hb of 6.8   which he says he gets every other weeks for unexplained GI blood loss.    Old chart reviewed on care everywhere :  CTA Chest: and abd done on 12.2.18  Pulmonary arteries are adequately opacified without acute or chronic filling defects. The thoracic aorta is normal in course and caliber without dissection or aneurysm.   The heart size is enlarged. There is a small pericardial effusion. Thoracic lymph nodes are not enlarged. Vascular calcifications are seen.  Bilateral pleural effusions are identified with bibasilar atelectasis, left greater than right. There is no pleural thickening or pneumothorax. The airways are  patent.  Abdomen:  A small hiatal hernia is seen. Hepatic steatosis is noted. The pancreas, spleen, adrenal glands, and kidneys otherwise demonstrate no acute pathology. Bilateral renal atrophy is identified.  Soft tissue anasarca is seen. There is no free air or lymph node enlargement. Abdominal aorta is not aneurysmal.  Pelvis:  There is a large amount of abdominopelvic ascites. Scattered colonic diverticuli are seen. Postoperative changes are seen following ventral hernia repair. There is no bowel wall thickening or obstruction. There is no free fluid. Lymph nodes are not   enlarged. Urinary bladder is unremarkable.  Skeleton:   There are no acute fractures. No suspicious bony lesions.    CMP done on admission: BUN/creatinine 47/7.56  Alkaline phosphatase 254  Normal AST ALT  Normal sodium potassium and bicarbonate  Venous glucose of 89.  CBC: WBC 6.5, H&H 8.3/25.1, platelet count 145.  First troponin 0.13.  EKG findings: Sinus tachycardia   Probable left atrial enlargement   Borderline prolonged QT interval   Baseline wander in lead(s) V2   No previous ECG available for comparison   QTc 493     Cardiology and nephrology consulted .     Pt is noncompliant to medical recommendation . Pt is self decitating his care while as in pt service .   TTE done on 12.24.18   LVEF of at 45-50%. There is grade II diastolic dysfunction of the left ventricle   tricuspid regurgitation is mild to moderate.   estimated pulmonary arterial pressure is mildly elevated.     PT SIGNED AMA .   HE HAS FULL MENTAL CAPACITY .        Procedures/Imaging   US Abdomen Limited Single Organ   Final Result   FINDINGS/IMPRESSION:      Reported but poorly demonstrated small left pleural effusion.      Small ascites in 4 quadrants of abdomen and pelvis.      ReadingStation:[id number]      Echocardiogram Adult Complete W Clr/ Dopp Waveform   Final Result      XR Abdomen AP   Final Result   No definite acute process in the abdomen.   Chronic  pleural thickening versus small bilateral effusions      ReadingStation:[id number]      NM Myocardial Perfusion Spect (Stress And Rest)    (  Results Pending)       Treatment Team:   Consulting Physician: Marin Olp, Vickey Boak  Consulting Physician: Cheral Marker, Jacquese Hackman         Progress Note/Physical Exam at Discharge     Subjective: pt was refusing to tale meds and refused to give his previous HD place info   Refused test re    Vitals:    04/12/17 1700 04/12/17 2029 04/13/17 0721 04/13/17 1152   BP: (!) 162/109 144/76  (!) 186/100   Pulse: (!) 107 (!) 107  (!) 103   Resp: 18 17  19    Temp: 98.2 F (36.8 C) (!) 96.8 F (36 C)  (!) 96.4 F (35.8 C)   TempSrc:  Axillary  Axillary   SpO2:  99%  99%   Weight:   110.6 kg (243 lb 13.3 oz)    Height:           On exam ; see progress note   Pt signed AMA        Diagnostics     Labs/Studies Pending at Discharge:none     Last Labs     Recent Labs  Lab 04/13/17  0508 04/12/17  0053 04/11/17  0446   WBC 4.5 4.6 5.4  5.1   RBC 2.29* 2.56* 2.48*  2.53*   Hemoglobin 7.5* 7.9* 7.8*  7.8*   Hematocrit 22.1* 24.5* 24.0*  24.3*   MCV 97 96 97  96   PLT CT 122* 131 132  130         Recent Labs  Lab 04/13/17  0508 04/11/17  0446 04/10/17  1009 04/10/17  0536   Sodium 138 140  141 140 143   Potassium 4.7 4.9  4.9 4.6 4.3   Chloride 102 104  105 103 103   CO2 26.9 22.2  23.8 22.3 25.0   BUN 36* 50*  53* 47* 43*   Creatinine 5.32* 8.27*  8.16* 7.56* 7.40*   Glucose 73 74  71 89 118*   Calcium 8.1* 7.2*  7.2* 7.5* 7.1*   Magnesium  --  1.7  --   --         Patient Instructions   Discharge Diet: diabetic cardiac renal   Discharge Activity: pt signed AMA     Follow Up Appointment:pt signed AMA        Time spent examining patient, discussing with patient/family regarding hospital course, chart review, reconciling medications and discharge planning:45 minutes.    Signed,  Baby Stairs a Tiffiany Beadles   3:34 PM 04/13/2017     Cc: PCP and all consultants on listed above

## 2017-04-13 NOTE — Progress Notes (Signed)
RN called about patient going down for a stress test. Nightshift Rn stated patient is refusing to go. During bedside report patient agreed to go for his stress test.     [phone] : patient left for stress test

## 2017-04-13 NOTE — Progress Notes (Signed)
CT department notified this RN and states if pt wants to have CT scan completed, CT department will try to work around pt's stress test and have the CT completed around the same time in AM when pt has to go down for stress. Will update pt.

## 2017-04-13 NOTE — Progress Notes (Signed)
Patient asked multiple times to get a set of vitals but keep refusing stating he doesn't feel well. All BP medications refused at this time as well. Will continue to monitor patient. Dr. Minus Liberty notified of this and patient also refusing stress test.

## 2017-04-13 NOTE — ED Notes (Signed)
Attempted to call VAT at this time for IV access

## 2017-04-13 NOTE — Progress Notes (Signed)
Patient is still dictating his care  Refusing to check vitals  He refused stress test on 04/11/17 and again he refused stress test this morning 04/13/17 stating that he did not feel good about it.  He complained of severe abdominal pain yesterday and when he was taken down for CT scan of the abdomen he refused the CT scan.  This morning is adamantly refusing to take antihypertensive.  And now he is mentioning that he wants to go home.  Discussed with the case manager; she'll assess the case and coming back..  Overall patient is very noncompliant.  Patient has full capacity to understand medical diagnosis and he has the capacity to make medical decision in my medical opinion.

## 2017-04-13 NOTE — ED Notes (Addendum)
Pt arrived c/o chest pain, c diff., and SOB. PT states he was admitted on Sunday for chest pain, SOB, and diarrhea. He as diagnosed with C diff. Pt states "I left AMA like a dummy and now I'm back because I don't feel any better." Pt has vomited 5 times today, diarrhea 8 times. Pt reports n/v and LLQ pain. PT denies fever, chills, or dizziness.

## 2017-04-13 NOTE — EDIE (Signed)
[name]?NOTIFICATION?04/13/2017 18:22?[name], Jorge?MRN: [id number]    This patient has registered at the [facility] Emergency Department   For more information visit: [email or web address]   Known Aliases  No known aliases.   Care Providers  Provider [facility] Type Phone Fax Service Dates   MATTHEW [facility] Primary Care   Current      ED Care Guidelines  There are currently no ED Care Guidelines in [place] for this patient. Please check your facility's medical records system.    Security Events  No recent Security Events currently on file    Recent Emergency Department Visit Summary  Showing 20 most recent visits out of 87 in the past 3 months  Admit Date Facility [facility] Type Major Type Diagnoses or Chief Complaint   Apr 13, 2017 [facility] [place]. [place] Emergency  Emergency      abd pain, diarrhea, vomit      Apr 10, 2017 [facility] Front. [place] Emergency  Emergency      Diarrhea      Shortness of Breath      Chest pain, unspecified      Diarrhea, unspecified      Apr 10, 2017 [facility] Amaya. [place] Emergency  Emergency  Chief Complaint: CHRONIC KIDNEY DISEASE, PULMONARY EDEMA    Apr 09, 2017 [facility] Rosemarie Ax. 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 [place] [facility]  Emergency     Apr 08, 2017 [facility] Emergency  Emergency     Apr 08, 2017 [facility] [place]. [place]. [place] Emergency  Emergency      Procedure and treatment not carried out because of patient's decision for unspecified reasons      Chest pain, unspecified      Apr 07, 2017 [facility] Rosemarie Ax. [place] Emergency  Emergency     Apr 06, 2017 [facility] [place]. [place] Emergency  Emergency  Chief Complaint: PT STS SOB    Apr 06, 2017 [facility] [place]. [place] Emergency  Emergency     Apr 05, 2017 [facility] [place]. [place] Emergency  Emergency      chest pain, shortness of breath, abdomin      Abdominal Pain      Diarrhea      Chest Pain      Chest pain, unspecified      Apr 04, 2017 [facility] Rosemarie Ax. [place] Emergency  Emergency     Apr 04, 2017 [facility] Rosemarie Ax.  Emergency  Emergency      SOB      Shortness of Breath      Chronic kidney disease,  unspecified      Heart failure, unspecified      Hypocalcemia      Essential (primary) hypertension      Abnormal levels of other serum enzymes      Apr 02, 2017 [facility] Rosemarie Ax. [place] Emergency  Emergency     Apr 01, 2017 [facility] Rosemarie Ax. [place] Emergency  Emergency      Abdominal Pain      Shortness of breath      Hypocalcemia      Diarrhea, unspecified      Anemia in chronic kidney disease      Generalized abdominal pain      Abnormal findings on diagnostic imaging of other specified body structures      Chronic kidney disease, unspecified      Mar 31, 2017 [facility]. Staff. [place] Emergency  Emergency      Abdominal/Stomach Pain      Abdominal Pain      End stage renal disease      Dependence on renal dialysis      Anemia in chronic kidney disease      Noninfective gastroenteritis and colitis, unspecified      Mar 30, 2017 [facility] Rosemarie Ax. [place] Emergency  Emergency     Mar 29, 2017 [facility] [place] [place] Emergency  Emergency     Mar 29, 2017 [facility] Rosemarie Ax. [place] Emergency  Emergency      Abdominal/Stomach Pain      Vomiting      Diarrhea      Diarrhea, unspecified      Essential (primary) hypertension      End stage renal disease      Opioid use, unspecified with unspecified opioid-induced disorder      Nausea      Hyperkalemia      Hypocalcemia      Mar 29, 2017 [facility] Rosemarie Ax. [place] Emergency  Emergency         Recent Inpatient Visit Summary  Showing 20 most recent visits out of 32 in the past 3 months  Admit Date Facility [facility] Type Major Type Diagnoses or Chief Complaint   Apr 10, 2017 [facility] [place]. [place] [facility]  Inpatient      Chest pain, unspecified      Apr 10, 2017 [facility]. [place]. [place] [facility]  Inpatient  Chief Complaint: CHRONIC KIDNEY DISEASE, PULMONARY EDEMA    Apr 07, 2017 [facility] Rosemarie Ax. [place] Telemetry  Inpatient      Nausea with vomiting, unspecified      Hypertensive heart disease with heart failure      Acute respiratory distress      Heart failure, unspecified      Shortness of breath      Fluid overload, unspecified      Anemia, unspecified      Dyspnea, unspecified      Enterocolitis due to Clostridium difficile, not specified as recurrent      Chest pain, unspecified      Apr 06, 2017 [facility] [place] [facility]  Inpatient  Chief Complaint: ESRD DOE    Apr 05, 2017 [facility] Rosemarie Ax.  Telemetry  Inpatient      End stage renal disease      Enterocolitis due to Clostridium difficile,  not specified as recurrent      Diarrhea, unspecified      Chest pain, unspecified      Unspecified abdominal pain      Apr 03, 2017 [facility] Rosemarie Ax. [place] Telemetry  Inpatient      Cerebral palsy, unspecified      Chronic kidney disease, unspecified      Shortness of breath      Heart failure, unspecified      Coronary angioplasty status      Fluid  overload, unspecified      Procedure and treatment not carried out due to patient leaving prior to being seen by health care provider      Allergy status to narcotic agent status      Family history of ischemic heart disease and other diseases of the circulatory system      Nicotine dependence, unspecified, uncomplicated      Apr 01, 2017 [facility] Rosemarie Ax. [place] [facility]  Inpatient     Apr 01, 2017 [place] [facility] Rosemarie Ax. [place] [facility]  Inpatient      Diarrhea, unspecified      Anemia in chronic kidney disease      Chronic kidney disease, unspecified      Hypocalcemia      Abnormal findings on diagnostic imaging of other specified body structures      Generalized abdominal pain      Mar 30, 2017 [facility] Rosemarie Ax. 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 [place] [facility]  Inpatient      Hypertensive urgency      Atherosclerotic heart disease of native coronary artery without angina pectoris      End stage renal disease      Shortness of breath      Other specified postprocedural states      Dependence on supplemental oxygen      Allergy status to other drugs, medicaments and biological substances status      Personal history of pneumonia (recurrent)      Nicotine dependence, unspecified, uncomplicated      Old myocardial infarction      Mar 21, 2017 [facility] Rosemarie Ax. [place] Inpatient  Inpatient      Hypokalemia      Secondary  hyperparathyroidism of renal origin      Other fatigue      Diarrhea, unspecified      Hyperkalemia      Heart failure, unspecified      Acute kidney failure, unspecified      End stage renal disease      Other ascites      Unspecified abdominal pain      Mar 20, 2017 [facility] Casandra Doffing. [place] [facility]  Inpatient      Other ascites      Unspecified abdominal pain      Anemia, unspecified      Fever, unspecified      Hypocalcemia      Chest pain, unspecified      Chest Pain      Hyperkalemia      Abdominal Pain      Diarrhea      Mar 19, 2017 [facility] Rosemarie Ax. [place] [facility]  Inpatient     Mar 18, 2017 [facility] [facility]. Rosemarie Ax. [place] [facility]  Inpatient      Chest pain, unspecified      Unspecified abdominal pain      Hypocalcemia      Acute pulmonary edema      Chronic kidney disease, unspecified      Abnormal levels of other serum enzymes      Mar 07, 2017 [facility] Alexa. [place] [facility]  Inpatient      Diarrhea, unspecified      Chest pain, unspecified      End stage renal disease      Dependence on renal dialysis      Mar 05, 2017 [facility] Rosemarie Ax. [place] Telemetry  Inpatient      Acute kidney failure, unspecified      Unspecified asthma, uncomplicated      Shortness of breath      Hypertensive chronic kidney disease with stage 5 chronic kidney disease or end stage renal disease      Patient's noncompliance with renal dialysis      Hypocalcemia      Anemia in chronic kidney disease      Noninfective gastroenteritis and colitis, unspecified      Allergy status to other drugs, medicaments and biological substances status      Thrombocytopenia, unspecified      Mar 04, 2017 [facility] Rosemarie Ax. [place] Inpatient  Inpatient      Nausea with vomiting, unspecified      Hypoglycemia, unspecified      Patient's noncompliance with renal dialysis      Dependence on renal dialysis      Diarrhea, unspecified      Nicotine  dependence, unspecified, uncomplicated      End stage renal disease      Hypertensive chronic kidney disease with stage 5 chronic kidney disease or end stage renal disease      Hypocalcemia      Tobacco abuse counseling      Mar 02, 2017 [facility] [place] [place] [facility]  Inpatient      Dependence on renal dialysis      Chronic obstructive pulmonary disease, unspecified      Hypertensive urgency      Hypertensive chronic kidney disease with stage 5 chronic kidney disease or end stage renal disease      Nicotine dependence, unspecified, uncomplicated      Anemia in chronic kidney disease      End stage renal disease      Atherosclerotic heart disease of native coronary artery without angina pectoris      Enterocolitis due to Clostridium difficile, not specified as recurrent      Hyperkalemia      Mar 01, 2017 [facility] Rosemarie Ax. 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 Alexa. [place] [facility]  Inpatient      Hypertensive urgency      Chest pain, unspecified      Diarrhea, unspecified          E.D. Visit Count (12 mo.)  Facility Visits   [facility] 7   [facility] 2   [facility] 1   [facility] 1   [facility] 1   [facility] 2   [facility] 1   [facility] 7   [facility] 2   [facility] [address]   [facility] 1   [facility] 1   [facility] 1    [facility] 6   [facility] 1   [facility] 32   [facility] 4   [facility] 1   [facility] 6   [facility] 5   J.WEliezer Champ[facility] 2   [facility] 2   [facility] [facility] 3   [facility] 1   [facility] 3   [facility] 2   [facility] 2   [facility] 5   [facility] 4   [facility] 2   [facility] 2   [place] [facility] 1   [facility] 1   [facility] 1   [facility] 1   [facility] 2   [facility] [address] 4   [facility] 2   [facility] 3   [facility] 1   [facility] 1   [facility] 1   [facility] 2   [facility] 1   [facility] 17   [facility] 2   [facility] 3   [facility] 14   [place] [facility] 1   [facility] 2   [facility] 1   [facility] 1   [facility] 1   [facility] 1   [facility] 1   Total 210   Note: Visits indicate total known visits.        Prescription Monitoring Program  000??- Narcotic Use Score  000??- Sedative Use Score  000??- Stimulant Use Score  - All Scores range from 000-999  with 75% of the population scoring < 200 and on 1% scoring above 650  - The last digit of the narcotic, sedative, and stimulant score indicates the number of active prescriptions of  that type  - Higher Use scores correlate with increased prescribers, pharmacies, mg equiv, and overlapping prescriptions   Concerning or unexpectedly high scores should prompt a review of the PMP record; this does not constitute checking PMP for prescribing purposes.    The above information is provided for the sole purpose of patient treatment. Use of this information beyond the terms of Data Sharing Memorandum of Understanding and License Agreement is prohibited. In certain cases not all visits may be represented. Consult the aforementioned facilities for additional information.   ? 2018 [employer] - [place], [place] - [email or web address]

## 2017-04-13 NOTE — ED Notes (Signed)
VAT at bedside

## 2017-04-13 NOTE — ED Provider Notes (Signed)
[facility] EMERGENCY DEPARTMENT  History and Physical Exam          Patient: [name]  Encounter Date: 04/13/2017    DOB: 12/01/1969  Age/Sex: 46 y.o. male    MRN: [id number]  Room: S20/S20-A    PCP: Christa See, MD  Attending: Magdalene Molly, MD        H&P (loc / qual / severity / dura / tim) ROS       [name] is a 46 y.o. male who presents with chief complaint of Abd pain. Location lower, quality "pain", severity mild-to-moderate, duration days.    Pt left AMA from the hospital today. He thought he would be able to get back to [facility] but is feeling worse now. Pt is being treated for C. difficile and was supposed to get a stress test for his chest pain. Pt is a dialysis pt, with his days on Monday Wednesday Friday. He has not had dialysis today. Pt estimates 5 vomits and 7-8 episodes of diarrhea today.           HPI/ROS limits: none  Hx given by: patient    Review of Systems   Constitutional: Negative for fever.   Respiratory: Negative for cough and shortness of breath.    Cardiovascular: Positive for chest pain.   Gastrointestinal: Positive for abdominal pain, diarrhea, nausea and vomiting.   Skin: Negative for rash.   Neurological: Negative for headaches.   All other systems reviewed and are negative.           ALL / MEDS / PMH / PSH / PFH / SH     Pt is allergic to lisinopril; morphine; and toradol [ketorolac tromethamine].    Current Facility-Administered Medications on File Prior to Encounter   Medication Dose Route Frequency Provider Last Rate Last Dose   . [DISCONTINUED] albuterol (PROVENTIL HFA;VENTOLIN HFA) inhaler 2 puff  2 puff Inhalation Q6H PRN Ilda Mori, MD   2 puff at 04/11/17 2245   . [DISCONTINUED] albuterol-ipratropium (DUO-NEB) 2.5-0.5(3) mg/3 mL nebulizer 3 mL  3 mL Nebulization Q6H PRN Cherlynn Perches A, MD       . [DISCONTINUED] amLODIPine (NORVASC) tablet 10 mg  10 mg Oral Daily Bobbye Charleston, MD   Stopped at 04/11/17 [phone]   . [DISCONTINUED] atorvastatin  (LIPITOR) tablet 80 mg  80 mg Oral QHS Bobbye Charleston, MD   80 mg at 04/12/17 2110   . [DISCONTINUED] B complex-vitamin C-folic acid (NEPHRO-VITE RX) tablet 1 mg  1 tablet Oral Daily Bobbye Charleston, MD   1 mg at 04/13/17 0907   . [DISCONTINUED] carvedilol (COREG) tablet 25 mg  25 mg Oral Q12H [facility] Bobbye Charleston, MD   25 mg at 04/12/17 2110   . [DISCONTINUED] docusate sodium (COLACE) capsule 100 mg  100 mg Oral Daily Minus Liberty, Md Ashfiqur, MD       . [DISCONTINUED] epoetin alfa (EPOGEN,PROCRIT) 81191 UNIT/ML injection 6,000 Units  6,000 Units Intravenous Mon-Wed-Fri Cheral Marker, MD   6,000 Units at 04/13/17 0908   . [DISCONTINUED] fidaxomicin (DIFICID) tablet 200 mg  200 mg Oral BID Bobbye Charleston, MD   200 mg at 04/13/17 4782   . [DISCONTINUED] folic acid (FOLVITE) tablet 1 mg  1 mg Oral Daily Bobbye Charleston, MD   1 mg at 04/13/17 0119   . [DISCONTINUED] heparin (porcine) (Heparin Sodium (Porcine)) injection 5,000 Units  5,000 Units Subcutaneous Q8H [facility] Bobbye Charleston, MD       . [  DISCONTINUED] hydrALAZINE (APRESOLINE) injection 10 mg  10 mg Intravenous Q6H PRN Romie Levee, MD   10 mg at 04/11/17 2208   . [DISCONTINUED] hydrALAZINE (APRESOLINE) tablet 100 mg  100 mg Oral TID Bobbye Charleston, MD   100 mg at 04/12/17 2110   . [DISCONTINUED] HYDROmorphone (DILAUDID) injection 0.5 mg  0.5 mg Intravenous Once Minus Liberty, [place] Ashfiqur, MD       . [DISCONTINUED] HYDROmorphone (DILAUDID) injection 0.5-1 mg  0.5-1 mg Intravenous Q3H PRN Bobbye Charleston, MD   1 mg at 04/13/17 1158   . [DISCONTINUED] HYDROmorphone (DILAUDID) injection 0.5-1 mg  0.5-1 mg Intravenous Q6H PRN Bobbye Charleston, MD       . [DISCONTINUED] hyoscyamine (LEVBID) 12 hr tablet 0.75 mg  0.75 mg Oral Q12H [facility] Minus Liberty, Md Ashfiqur, MD   0.75 mg at 04/13/17 0907   . [DISCONTINUED] lactobacillus species (BIO-K PLUS) capsule 50 Billion CFU  50 Billion CFU Oral Daily Bobbye Charleston, MD   50 Billion CFU at 04/13/17  0908   . [DISCONTINUED] naloxone (NARCAN) injection 0.4 mg  0.4 mg Intravenous PRN Bobbye Charleston, MD       . [DISCONTINUED] nitroglycerin (NITROSTAT) SL tablet 0.4 mg  0.4 mg Sublingual Q5 Min PRN Minus Liberty, Md Ashfiqur, MD       . [DISCONTINUED] ondansetron (ZOFRAN-ODT) disintegrating tablet 4 mg  4 mg Oral 4X Daily PRN Ilda Mori, MD   4 mg at 04/10/17 1235   . [DISCONTINUED] pantoprazole (PROTONIX) EC tablet 40 mg  40 mg Oral Daily Bobbye Charleston, MD   40 mg at 04/13/17 1308   . [DISCONTINUED] senna-docusate (PERICOLACE) 8.6-50 MG per tablet 2 tablet  2 tablet Oral QHS Minus Liberty, Md Ashfiqur, MD       . [DISCONTINUED] sevelamer (RENVELA) tablet 800 mg  800 mg Oral TID MEALS Cherlynn Perches A, MD   800 mg at 04/13/17 1158   . [DISCONTINUED] sodium chloride (PF) 0.9 % injection 3 mL  3 mL Intravenous Q8H Bobbye Charleston, MD   3 mL at 04/13/17 6578     Current Outpatient Prescriptions on File Prior to Encounter   Medication Sig Dispense Refill   . amLODIPine (NORVASC) 10 MG tablet Take 10 mg by mouth daily.     . carvedilol (COREG) 12.5 MG tablet Take 12.5 mg by mouth.     . hydrALAZINE (APRESOLINE) 100 MG tablet Take 100 mg by mouth 3 (three) times daily.     . sevelamer (RENAGEL) 800 MG tablet Take 800 mg by mouth 3 (three) times daily with meals.     Marland Kitchen albuterol (PROVENTIL HFA;VENTOLIN HFA) 108 (90 Base) MCG/ACT inhaler Inhale 2 puffs into the lungs every 4 (four) hours as needed for Shortness of Breath.     Marland Kitchen albuterol-ipratropium (COMBIVENT RESPIMAT) 20-100 MCG/ACT Aero Soln Inhale 1 puff into the lungs 4 (four) times daily.       He has a past medical history of Asthma; Coronary artery disease; Heart attack (2010); Hypertension; Kidney failure (2008); and Sleep apnea.    He has a past surgical history that includes Abdominal surgery; REPAIR, UMBILICAL HERNIA (2016); Inguinal hernia repair (Right, 2016); pericentesis; and biopsey.    He reports that he has been smoking Cigarettes.  He has a  5.00 pack-year smoking history. He has never used smokeless tobacco. He reports that he does not drink alcohol or use drugs.    His family history includes Arthritis in his paternal grandfather; Heart attack  in his maternal grandmother; Hypertension in his mother.         Physical Exam       Constitutional: Patient is in mild discomfort.   Eyes: Pupils equal. EOMI.   ENMT: NL appearance of ears/nose. Mucous membranes dry.   Respiratory: Lungs are clear bilaterally. No WOB.   Cardiovascular: Heart RRR. Leg edema 2+ bilaterally.   GI/GU: Tender to palpation in lower abdomen.   Skin: Skin is warm and dry.   Neurologic: Awake and alert. Memory intact.   Psychiatric: Normal affect and insight; no agitation.   Musculoskeletal: Neck is supple. Trachea is midline.     Blood pressure (!) 171/104, pulse (!) 108, temperature 98 F (36.7 C), temperature source Tympanic, resp. rate 20, height 1.854 m, weight 110.6 kg, SpO2 100 %.         Labs and Imaging     Labs  Labs Reviewed   CBC AND DIFFERENTIAL   COMPREHENSIVE METABOLIC PANEL   TROPONIN I       Radiologic Studies  Xr Abdomen Ap    Result Date: 04/10/2017  No definite acute process in the abdomen. Chronic pleural thickening versus small bilateral effusions ReadingStation:[id number]    Xr Chest Ap Portable    Result Date: 04/10/2017  Minimal pulmonary edema. ReadingStation:[id number]    US Abdomen Limited Single Organ    Result Date: 04/12/2017  FINDINGS/IMPRESSION: Reported but poorly demonstrated small left pleural effusion. Small ascites in 4 quadrants of abdomen and pelvis. ReadingStation:[id number]      ED Medication Orders     Start Ordered     Status Ordering Provider    04/13/17 2322 04/13/17 2321  HYDROmorphone (DILAUDID) injection 0.5 mg  Once in ED     Route: Intravenous  Ordered Dose: 0.5 mg     Ordered Allona Gondek B    04/13/17 2322 04/13/17 2321  ondansetron (ZOFRAN) injection 4 mg  Once in ED     Route: Intravenous  Ordered Dose: 4 mg     Ordered  Katelyne Galster B    04/13/17 2322 04/13/17 2321  sodium chloride 0.9 % bolus 500 mL  Once in ED     Route: Intravenous  Ordered Dose: 500 mL     Ordered Iris Tatsch B            EKG: See Epiphany for formal interpretation.         Procedures & Critical Care / MDM     CRITICAL CARE   By Magdalene Molly, MD  11:02 PM    Total Time: 35 minutes    Patient presented with symptoms consistent with a critical illness or injury such that there is a high probability of imminent or life-threatening deterioration in the patient's condition.  Critical care services are exclusive of time spent performing procedures. The time does include time spent directly with [name], documenting, reviewing labs and radiographs, and speaking with medical staff.    -----------------      The differential diagnosis includes, but is not limited to CHOLECYSTITIS, CHOLANGITIS, GASTRITIS, PEPTIC ULCER DISEASE, SMALL BOWEL OBSTRUCTION, PERFORATED VISCOUS, APPENDICITIS, DIVERTICULAR DISEASE, COLITIS    11:25 PM  Pt is willing to be readmitted. I will recheck some labs including troponin.      00:47 AM  HR 103, BP 194/104     Labs / imaging were reviewed and explained to the patient. All questions have been answered. Old records reviewed.         Diagnosis  Final diagnoses:   Acute chest pain   Acute abdominal pain  Acute C. Diff  Vomiting / diarrhea  Elevated troponin  ESRD  Critical Care: 35 min      ED Disposition     ED Disposition Condition Date/Time Comment    Admit  Wed Apr 13, 2017 11:21 PM           In addition to the above history, please see nursing notes.  Allergies, meds, past medical, family, social hx, and the results of the diagnostic studies performed have been reviewed.  This is note has been created using an EMR that may contain additions or subtractions not intended by myself, Magdalene Molly, MD.              Magdalene Molly, MD  04/21/17 [phone]

## 2017-04-13 NOTE — Progress Notes (Signed)
Patient refusing BP medications, vital signs and dialysis. Dr. Minus Liberty notified of patient refusing everything. Case management called to bedside along with security. Patient requesting to leave but demanding a ride. Nurse, charge nurse and nurse case manager explained in great detail why he cannot get discharged because it is an unsafe discharge plan. Patient requested AMA paperwork and RN along with security had him sign it. IV removed. All belongings given to patient.

## 2017-04-13 NOTE — Progress Notes (Signed)
Patient: [name]  Date: 04/13/2017   PCP: Christa See, Shalaya Swailes  Admission Date: 04/10/2017     Interim Summary:  46 y.o. male from [place] AV was viting his father in this area ;  PMH of Essential HTN , ESRD with chronic anemia with base line Hb ~7.5 on HD; MWF , CAD s/p  2 stents placed post MI in 2010 at [facility] ,OSA was not on CPAP at home  was transferred   from outside facility for higher level of care for SOB   And increasing diarrhea .  Pt reports that he has had increasing diarrhea for one week, and he says it same as the C.Diff that he has had in the past  , he stopped flagyl 2 weeks ago for a bout of C.Diff.   He says he was just discharged from a hospital in [place].   He reports that he has history of MI with stents in 2010 developed chest pressure "like something sitting on my chest" along with shortness of breath prior to reporting to [facility] .   He has also had some abdominal cramping. He gets dialysis M,W,F and he did have dialysis on Friday; 12.21.18   He stated that  He got one unit of PRBC on 12.21.18 after HD  B/o low hb of 6.8   which he says he gets every other weeks for unexplained GI blood loss.    Old chart reviewed on care everywhere :  CTA Chest: and abd done on 12.2.18  Pulmonary arteries are adequately opacified without acute or chronic filling defects. The thoracic aorta is normal in course and caliber without dissection or aneurysm.   The heart size is enlarged. There is a small pericardial effusion. Thoracic lymph nodes are not enlarged. Vascular calcifications are seen.  Bilateral pleural effusions are identified with bibasilar atelectasis, left greater than right. There is no pleural thickening or pneumothorax. The airways are patent.  Abdomen:  A small hiatal hernia is seen. Hepatic steatosis is noted. The pancreas, spleen, adrenal glands, and kidneys otherwise demonstrate no acute pathology.  Bilateral renal atrophy is identified.  Soft tissue anasarca is seen. There is no free air or lymph node enlargement. Abdominal aorta is not aneurysmal.  Pelvis:  There is a large amount of abdominopelvic ascites. Scattered colonic diverticuli are seen. Postoperative changes are seen following ventral hernia repair. There is no bowel wall thickening or obstruction. There is no free fluid. Lymph nodes are not   enlarged. Urinary bladder is unremarkable.  Skeleton:   There are no acute fractures. No suspicious bony lesions.    CMP done on admission: BUN/creatinine 47/7.56  Alkaline phosphatase 254  Normal AST ALT  Normal sodium potassium and bicarbonate  Venous glucose of 89.  CBC: WBC 6.5, H&H 8.3/25.1, platelet count 145.  First troponin 0.13.  EKG findings:  Sinus tachycardia   Probable left atrial enlargement   Borderline prolonged QT interval   Baseline wander in lead(s) V2   No previous ECG available for comparison   QTc 493     Cardiology and nephrology consulted .     Pt is noncompliant to medical recommendation . Pt is self decitating his care while as in pt service .   TTE done on 12.24.18   LVEF of at 45-50%. There is grade II diastolic dysfunction of the left ventricle   tricuspid regurgitation is mild to moderate.    estimated pulmonary arterial pressure is mildly elevated.  Subjective :   Had 4 episodes of diarrhea as per the patient himself  Patient is stating that he does not want to take blood pressure pills before dialysis because he bottoms down  Patient refused stress test stating that he did not feel good  Refused CT scan of the abdomen last night  He refused stress test on 04/11/17 as well    Assessment and Plan:     Chest pain, chest tightness and SOB in context of history of CAD and status post stent placed in 2010   Twelve-lead EKG no acute ST T changes   Troponins ; 0.14> 0.13>0.11   cardiologist follow up appreciated   Transthoracic echocardiogram   Heparin drip was not initiated  because patient has unexplained blood loss requiring blood transfusion every other week after dialysis as per patient.  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    Recurrent f Clostridium difficile associated diarrhea  Completed 14 day course of oral vancomycin and Flagyl  Cont  Dificid started on 12.23.18   Contact isolation  Tolerating diet well  Refused CT scan despite explaining the necessity and he verbalized understanding of        Chronic anemia  Iron panel AOCD   Out pt GI eval         Essential hypertension  Uncontrolled   Patient is adamantly refusing to take medications before dialysis despite reassuring and confirmed the high blood pressure  He KEPT  doing it ever since he got admitted  Very noncompliant    OSA   Cont CPAP at night .  O2 as needed to keep Spo2 >91%  Pt doesn't have a CPAP machine at home   Requested  to arrange for CPAP machine at home.    Mild thrombo-cytopenia  And to monitor CBC      Medical noncompliance    CODE STATUS: Full code  No emergency contact documented.    DVT prophylaxis: Currently on not on chemical DVT prophylaxis because of low H&H  Reported unexplained blood loss by the patient    Disposition:   Pt had refused Stress test on 12.24.18 and on 04/13/17  Cont abx for CDAD   Now patient wants to be discharged.  Discussed with case Arts administrator. She'll keep me updated after she assess the case.    Discussed with patient, discussed with primary RN.      LOS 3 Days  Koraima Albertsen Otho Ket, Delayna Sparlin    04/13/2017 12:44 PM    Please contact at phone number [phone]  if any questions.      Objective   Exam: Patient is awake, alert and oriented x 3   Laying on bed, comfortable; demanding to discharge him now  HEENT: PERRLA, EOMI  Neck: supple, without JVD  Chest: CTA bilaterally. No crackles or wheezing.  CVS: S1, S2 regular rate and rhythm  no murmurs    Abdomen: soft and mild tenderness without any rigidity   with good bowel sounds.  Musculoskeletal:pitting edema, pulses palpable  Skin: Warm dry no worrisome lesions  NEURO:  Cranial nerve II-12 intact no motor or  sensory deficits  Psychiatric: alert, interactive, appropriate, normal affect.    I/O last 3 completed shifts:  In: 1200 [P.O.:1200]  Out: 3000 [Other:3000]  Vitals:    04/12/17 1700 04/12/17 2029 04/13/17 0721 04/13/17 1152   BP: (!) 162/109 144/76  (!) 186/100   Pulse: (!) 107 (!) 107  (!) 103   Resp: 18 17  19    Temp: 98.2 F (36.8 C) (!) 96.8 F (36 C)  (!) 96.4 F (35.8 C)   TempSrc:  Axillary  Axillary   SpO2:  99%  99%   Weight:   110.6 kg (243 lb 13.3 oz)    Height:         Wt Readings from Last 4 Encounters:   04/13/17 110.6 kg (243 lb 13.3 oz)   04/10/17 111.1 kg (245 lb)       Current Facility-Administered Medications   Medication Dose Route Frequency   . amLODIPine  10 mg Oral Daily   . atorvastatin  80 mg Oral QHS   . B complex-vitamin C-folic acid  1 tablet Oral Daily   . carvedilol  25 mg Oral Q12H [facility]   . docusate sodium  100 mg Oral Daily   . epoetin alfa  6,000 Units Intravenous Mon-Wed-Fri   . fidaxomicin  200 mg Oral BID   . folic acid  1 mg Oral Daily   . heparin (porcine)  5,000 Units Subcutaneous Q8H [facility]   . hydrALAZINE  100 mg Oral TID   . HYDROmorphone  0.5 mg Intravenous Once   . hyoscyamine  0.75 mg Oral Q12H [facility]   . lactobacillus species  50 Billion CFU Oral Daily   . pantoprazole  40 mg Oral Daily   . senna-docusate  2 tablet Oral QHS   . sevelamer  800 mg Oral TID MEALS   . sodium chloride (PF)  3 mL Intravenous Q8H                Recent Labs  Lab 04/13/17  0508 04/12/17  0053   WBC 4.5 4.6   RBC 2.29* 2.56*   Hemoglobin 7.5* 7.9*   Hematocrit 22.1* 24.5*   MCV 97 96    PLT CT 122* 131       Recent Labs  Lab 04/11/17  0446   PT 10.7   PT INR 1.1       Recent Labs  Lab 04/10/17  2038 04/10/17  1613 04/10/17  1009   Troponin I 0.11* 0.13* 0.14*     No results found for: [id number]   Recent Labs  Lab 04/13/17  0508 04/11/17  0446 04/10/17  1009   Glucose 73 74  71 89   Sodium 138 140  141 140   Potassium 4.7 4.9  4.9 4.6   Chloride 102 104  105 103   CO2 26.9 22.2  23.8 22.3   BUN 36* 50*  53* 47*   Creatinine 5.32* 8.27*  8.16* 7.56*   EGFR 14* 8*  8* 9*   Calcium 8.1* 7.2*  7.2* 7.5*       Recent Labs  Lab 04/11/17  0446 04/10/17  1009   Magnesium 1.7  --    Phosphorus 4.6  --    Albumin 2.9* 3.0*   Protein, Total 6.6 6.8   Bilirubin, Total 0.7 0.7   Alkaline Phosphatase 242* 254*   ALT 14 15   AST (SGOT) 24 26           Invalid  input(s):  AMORPHOUSUA   Xr Abdomen Ap    Result Date: 04/10/2017  No definite acute process in the abdomen. Chronic pleural thickening versus small bilateral effusions ReadingStation:[id number]    Xr Chest Ap Portable    Result Date: 04/10/2017  Minimal pulmonary edema. ReadingStation:[id number]    US Abdomen Limited Single Organ    Result Date: 04/12/2017  FINDINGS/IMPRESSION: Reported but poorly demonstrated small left pleural effusion. Small ascites in 4 quadrants of abdomen and pelvis. ReadingStation:[id number]    Labs and radiology reports have been reviewed by me  MRN: [id number]

## 2017-04-13 NOTE — Progress Notes (Signed)
RNCM attempted to screen pt this am. Pt refuses to tell RNCM which dialysis center he receives HD with.  Pt also tells RNCM he is living in [address] then later reports he will be living in [place] and then again later reports he will living in [place].  Pt reports he just goes to the ER MWF at [facility] in [place] and "I tell them I need dialysis and they do it in the ER and let me go"  RNCM unable to confirm this information with [facility].  Upon further questioning, pt refuses to let RNCM have his medicaid application verified.  Pt later admits that he has been discharged from both [facility] and [facility] in [place].  Pt refuses to allow RNCM to verify any existing outpt HD chair at any facility.  Pt requesting to be discharged.  RNCM explained in great detail with security and charge RN present that we are unable to discharge him without an outpt HD chair arranged.  Pt very angry and is shouting "Ill just go to the ER and get dialysis"  RNCM attempted to educate pt and explain to pt that this is not a safe plan and that if he can confirm where he will be living [facility] can arranged SPA agreement with a facility until his medicare/medicaid application approved.  Pt again refuses.  PT advised we are unable to provide him a taxi voucher as he is leaving AMA and we strongly encourage him to stay in house while we arranged an outpt HD chair.  Pt angry and voiced he will be leaving AMA.  Pt signed AMA paperwork and escorted out by securtiy    Warren Danes RN, BSN  Nurse Case Manager  Phone:  [phone]  Fax:  [phone]

## 2017-04-13 NOTE — Plan of Care (Addendum)
Problem: Chest Pain  Goal: Vital signs and cardiac rhythm stable  Outcome: Progressing   04/11/17 0846   Goal/Interventions addressed this shift   Vital signs and cardiac rhythm stable Monitor [name] vital signs/cardiac rhythms;Monitor labs;Assess the need for oxygen therapy and administer as ordered       Comments: Pt refusing vital signs at times, not on tele d/t refusing, and also refusing heparin subq. However, pt was cooperative and took all BP medications during shift. Pt denies CP but c/o of abdominal pain, PRN pain medication given throughout shift. Pt aware of being NPO for stress test in AM. Call bell in reach, nonskids on, will continue monitoring.   0500- Pt refused vitals and refused daily weight.

## 2017-04-14 ENCOUNTER — Emergency Department
Admission: EM | Admit: 2017-04-14 | Discharge: 2017-04-14 | Discharge status: Against Medical Advice | Payer: Self-pay | Attending: Emergency Medicine | Admitting: Emergency Medicine

## 2017-04-14 ENCOUNTER — Emergency Department
Admission: EM | Admit: 2017-04-14 | Discharge: 2017-04-14 | Discharge status: Against Medical Advice | Payer: Self-pay | Attending: General Practice | Admitting: General Practice

## 2017-04-14 ENCOUNTER — Inpatient Hospital Stay: Payer: Self-pay

## 2017-04-14 DIAGNOSIS — R109 Unspecified abdominal pain: Secondary | ICD-10-CM | POA: Insufficient documentation

## 2017-04-14 DIAGNOSIS — R079 Chest pain, unspecified: Secondary | ICD-10-CM | POA: Insufficient documentation

## 2017-04-14 DIAGNOSIS — A0472 Enterocolitis due to Clostridium difficile, not specified as recurrent: Secondary | ICD-10-CM | POA: Insufficient documentation

## 2017-04-14 DIAGNOSIS — R112 Nausea with vomiting, unspecified: Secondary | ICD-10-CM | POA: Insufficient documentation

## 2017-04-14 DIAGNOSIS — I10 Essential (primary) hypertension: Secondary | ICD-10-CM | POA: Insufficient documentation

## 2017-04-14 DIAGNOSIS — N189 Chronic kidney disease, unspecified: Secondary | ICD-10-CM | POA: Insufficient documentation

## 2017-04-14 DIAGNOSIS — R197 Diarrhea, unspecified: Secondary | ICD-10-CM

## 2017-04-14 DIAGNOSIS — I129 Hypertensive chronic kidney disease with stage 1 through stage 4 chronic kidney disease, or unspecified chronic kidney disease: Secondary | ICD-10-CM | POA: Insufficient documentation

## 2017-04-14 LAB — COMPREHENSIVE METABOLIC PANEL
ALT: 16 U/L (ref 0–55)
AST (SGOT): 26 U/L (ref 10–42)
Albumin/Globulin Ratio: 0.86 Ratio (ref 0.70–1.50)
Albumin: 3 gm/dL — ABNORMAL LOW (ref 3.5–5.0)
Alkaline Phosphatase: 237 U/L — ABNORMAL HIGH (ref 40–145)
Anion Gap: 17.7 mMol/L (ref 7.0–18.0)
BUN / Creatinine Ratio: 7 Ratio — ABNORMAL LOW (ref 10.0–30.0)
BUN: 46 mg/dL — ABNORMAL HIGH (ref 7–22)
Bilirubin, Total: 0.7 mg/dL (ref 0.1–1.2)
CO2: 24.8 mMol/L (ref 20.0–30.0)
Calcium: 8.6 mg/dL (ref 8.5–10.5)
Chloride: 103 mMol/L (ref 98–110)
Creatinine: 6.58 mg/dL — ABNORMAL HIGH (ref 0.80–1.30)
EGFR: 11 mL/min/{1.73_m2} — ABNORMAL LOW (ref 60–150)
Globulin: 3.5 gm/dL (ref 2.0–4.0)
Glucose: 76 mg/dL (ref 71–99)
Osmolality Calc: 290 mOsm/kg (ref 275–300)
Potassium: 5.5 mMol/L — ABNORMAL HIGH (ref 3.5–5.3)
Protein, Total: 6.5 gm/dL (ref 6.0–8.3)
Sodium: 140 mMol/L (ref 136–147)

## 2017-04-14 LAB — CBC AND DIFFERENTIAL
Basophils %: 1.3 % (ref 0.0–3.0)
Basophils Absolute: 0.1 10*3/uL (ref 0.0–0.3)
Eosinophils %: 5.5 % (ref 0.0–7.0)
Eosinophils Absolute: 0.2 10*3/uL (ref 0.0–0.8)
Hematocrit: 32.4 % — ABNORMAL LOW (ref 39.0–52.5)
Hemoglobin: 10.7 gm/dL — ABNORMAL LOW (ref 13.0–17.5)
Lymphocytes Absolute: 0.3 10*3/uL — ABNORMAL LOW (ref 0.6–5.1)
Lymphocytes: 8.8 % — ABNORMAL LOW (ref 15.0–46.0)
MCH: 32 pg (ref 28–35)
MCHC: 33 gm/dL (ref 32–36)
MCV: 96 fL (ref 80–100)
MPV: 7.9 fL (ref 6.0–10.0)
Monocytes Absolute: 0.4 10*3/uL (ref 0.1–1.7)
Monocytes: 9.2 % (ref 3.0–15.0)
Neutrophils %: 75.2 % (ref 42.0–78.0)
Neutrophils Absolute: 2.9 10*3/uL (ref 1.7–8.6)
PLT CT: 104 10*3/uL — ABNORMAL LOW (ref 130–440)
RBC: 3.38 10*6/uL — ABNORMAL LOW (ref 4.00–5.70)
RDW: 15.1 % — ABNORMAL HIGH (ref 11.0–14.0)
WBC: 3.9 10*3/uL — ABNORMAL LOW (ref 4.0–11.0)

## 2017-04-14 LAB — TROPONIN I: Troponin I: 0.14 ng/mL — ABNORMAL HIGH (ref 0.00–0.02)

## 2017-04-14 LAB — HEPATITIS B SURFACE ANTIBODY: Hepatitis B Surface Antibody Quantitative: <5 m[IU]/mL — ABNORMAL LOW (ref >=10)

## 2017-04-14 MED ORDER — ONDANSETRON HCL 4 MG/2ML IJ SOLN
INTRAMUSCULAR | Status: AC
Start: 2017-04-14 — End: ?
  Filled 2017-04-14: qty 2

## 2017-04-14 MED ORDER — ONDANSETRON 4 MG PO TBDP
4.00 mg | ORAL_TABLET | Freq: Three times a day (TID) | ORAL | Status: DC | PRN
Start: 2017-04-14 — End: 2017-04-14

## 2017-04-14 MED ORDER — SODIUM CHLORIDE 0.9 % IJ SOLN
3.00 mL | Freq: Three times a day (TID) | INTRAMUSCULAR | Status: DC
Start: 2017-04-14 — End: 2017-04-14
  Administered 2017-04-14: 03:00:00 3 mL via INTRAVENOUS

## 2017-04-14 MED ORDER — HYDROMORPHONE HCL 0.5 MG/0.5 ML IJ SOLN
INTRAMUSCULAR | Status: AC
Start: 2017-04-14 — End: ?
  Filled 2017-04-14: qty 0.5

## 2017-04-14 MED ORDER — NALOXONE HCL 0.4 MG/ML IJ SOLN (WRAP)
0.40 mg | INTRAMUSCULAR | Status: DC | PRN
Start: 2017-04-14 — End: 2017-04-14

## 2017-04-14 MED ORDER — ONDANSETRON HCL 4 MG/2ML IJ SOLN
4.00 mg | Freq: Three times a day (TID) | INTRAMUSCULAR | Status: DC | PRN
Start: 2017-04-14 — End: 2017-04-14

## 2017-04-14 MED ORDER — HYDRALAZINE HCL 50 MG PO TABS
100.00 mg | ORAL_TABLET | Freq: Three times a day (TID) | ORAL | Status: DC
Start: 2017-04-14 — End: 2017-04-14
  Filled 2017-04-14 (×4): qty 2

## 2017-04-14 MED ORDER — HYDRALAZINE HCL 50 MG PO TABS
100.00 mg | ORAL_TABLET | Freq: Three times a day (TID) | ORAL | Status: DC
Start: 2017-04-14 — End: 2017-04-14
  Filled 2017-04-14: qty 2

## 2017-04-14 MED ORDER — ENOXAPARIN SODIUM 30 MG/0.3ML SC SOLN
30.00 mg | Freq: Every evening | SUBCUTANEOUS | Status: DC
Start: 2017-04-14 — End: 2017-04-14
  Filled 2017-04-14 (×3): qty 0.3

## 2017-04-14 MED ORDER — ALBUTEROL-IPRATROPIUM 2.5-0.5 (3) MG/3ML IN SOLN
3.00 mL | Freq: Four times a day (QID) | RESPIRATORY_TRACT | Status: DC
Start: 2017-04-14 — End: 2017-04-14
  Filled 2017-04-14 (×5): qty 3

## 2017-04-14 MED ORDER — AMLODIPINE BESYLATE 5 MG PO TABS
10.00 mg | ORAL_TABLET | Freq: Every day | ORAL | Status: DC
Start: 2017-04-14 — End: 2017-04-14
  Filled 2017-04-14: qty 2

## 2017-04-14 MED ORDER — CARVEDILOL 6.25 MG PO TABS
12.50 mg | ORAL_TABLET | Freq: Two times a day (BID) | ORAL | Status: DC
Start: 2017-04-14 — End: 2017-04-14

## 2017-04-14 MED ORDER — ACETAMINOPHEN 325 MG PO TABS
650.00 mg | ORAL_TABLET | Freq: Four times a day (QID) | ORAL | Status: DC | PRN
Start: 2017-04-14 — End: 2017-04-14

## 2017-04-14 MED ORDER — SENNOSIDES-DOCUSATE SODIUM 8.6-50 MG PO TABS
2.00 | ORAL_TABLET | Freq: Every evening | ORAL | Status: DC
Start: 2017-04-14 — End: 2017-04-14
  Filled 2017-04-14: qty 2

## 2017-04-14 MED ORDER — SEVELAMER HCL 800 MG PO TABS
800.00 mg | ORAL_TABLET | Freq: Three times a day (TID) | ORAL | Status: DC
Start: 2017-04-14 — End: 2017-04-14
  Filled 2017-04-14 (×3): qty 1

## 2017-04-14 MED ORDER — DOCUSATE SODIUM 100 MG PO CAPS
100.00 mg | ORAL_CAPSULE | Freq: Every day | ORAL | Status: DC
Start: 2017-04-14 — End: 2017-04-14
  Filled 2017-04-14: qty 1

## 2017-04-14 MED ORDER — CARVEDILOL 6.25 MG PO TABS
12.50 mg | ORAL_TABLET | Freq: Two times a day (BID) | ORAL | Status: DC
Start: 2017-04-14 — End: 2017-04-14
  Filled 2017-04-14 (×2): qty 2

## 2017-04-14 NOTE — Discharge Instructions (Signed)
Uncertain Causes of Chest Pain    Chest pain can happen for a number of reasons. Sometimes the cause can't be determined. If yourcondition does not seem serious, and your pain does not appear to be coming from your heart, your healthcare provider may recommend watching it closely. Sometimes the signs of a serious problem take more time to appear. Many problems not related to your heart can cause chest pain.These include:   Musculoskeletal. Costochondritis, an inflammation of the tissues around the ribs that can occur from trauma or overuse injuries   Respiratory. Pneumonia, pneumothorax, or pneumonitis (inflammation of the lining of the chest and lungs)   Gastrointestinal. Esophageal reflux, heartburn, or gallbladder disease   Anxiety and panic disorders   Nerve compression and neuritis   Miscellaneous problems such as aortic aneurysm or pulmonary embolism (a blood clot in the lungs)  Home care  After your visit, follow these recommendations:   Rest today and avoid strenuous activity.   Take any prescribed medicine as directed.   Be aware of any recurrent chest pain and notice any changes  Follow-up care  Follow up with your healthcare provider if you do not start to feel better within 24 hours, or as advised.  Call 911  Call 911 if any of these occur:   A change in the type of pain: if it feels different, becomes more severe, lasts longer, or begins to spread into your shoulder, arm, neck, jaw or back   Shortness of breath or increased pain with breathing   Weakness, dizziness, or fainting   Rapid heart beat   Crushing sensation in your chest  When to seek medical advice  Call your healthcare provider right away if any of the following occur:   Cough with dark colored sputum (phlegm) or blood   Fever of 100.78F(38C) or higher, or as directed by your healthcare provider   Swelling, pain or redness in one leg   Shortness of breath  Date Last Reviewed: 04/17/2014   2000-2016 [employer]. [address], [place] [address]. All rights reserved. This information is not intended as a substitute for professional medical care. Always follow your healthcare professional's instructions.          Chronic Kidney Disease (CKD)    The role of the kidneys is to remove waste products and extra water from the blood. When the kidneys do not work as they should, waste products begin to build up in the blood. This is called chronic kidney disease (CKD). CKD means that you have kidney damage or a decrease in kidney function lasting at least 3 months. CKD allows extra water, waste, and toxins to build up in the body. This can eventually become life-threatening. You might need dialysis or a kidney transplant to stay alive. This most severe form is called end stage renal disease.  Diabetes is the leading causes of chronic renal failure. Other causes include high blood pressure, hardening of the arteries (atherosclerosis), lupus, inflammation of the blood vessels (vasculitis), and past viral or bacterial infections. Certain over-the-counter pain medicines can cause renal failure when taken often over a long period of time. These include aspirin, ibuprofen, and related anti-inflammatory medicines called NSAIDs (nonsteroidal anti-inflammatory drugs).  Home care  The following guidelines will help you care for yourself at home:   If you have diabetes, talkwith your healthcare provider about keeping your blood sugar under control. Ask if you need to make and changes to your diet, lifestyle, or  medicines.   If you have high blood pressure:   Take prescribed medicine to lower your blood pressure to the recommended goal of less than 130/80.   Start a regular exercise program that you enjoy.Check with your healthcare provider to be sure your planned exercise program is right for you.   Eat less salt (sodium).Your healthcare provider can tell you how much salt per day is safe for you.   If you are  overweight, talkwith yourhealthcare providerabout a weight loss plan.   If you smoke, you must quit. Smoking makes kidney disease worse.Talkwith your healthcare provider about ways to help you quit. For more information, visit the following links:   CostumeResources.fi.pdf   www.smokefree.gov   www.cancer.org/healthy/stayawayfromtobacco/guidetoquittingsmoking/   Most people withCKD need to follow a special diet. Be sure you understand yours. In general, you will need to limit protein, salt, potassium, and phosphorus.You also need to limit how much fluid you drink.   CKD is a risk factor for heart disease. Talkwith your healthcare provider about any other risk factors you might have and what you can do to lessen them.   Talkwith your healthcare provider about any medicines you are taking to find out if they need to be reduced or stopped.   Don't use the following over-the-counter medicines, or consult your healthcare provider before using:   Aspirin and NSAIDs such as ibuprofen or naproxen. Using acetaminophen for fever or pain is OK.   Laxatives and antacids containing magnesium or aluminum   Fleet or phospho soda enemas containing phosphorus   Certain stomach acid-blocking medicine such as cimetidine or ranitidine   Decongestants containing pseudoephedrine   Herbal supplements  Follow-up care  Follow up with your healthcare provider, or as advised. Contact one of the following for more information:   American Association of Kidney Patients (937) 606-8040 www.https://www.miller-montoya.com/   [employer] (678)068-3141 www.kidney.org   American Kidney Fund (610)323-0005 www.kidneyfund.org   National Kidney Disease Education Program 866-4KIDNEY https://rodriguez.biz/  If an X-ray, ECG (cardiogram), or other diagnostic test was taken, you will be told of any new findings that may affect your care.  Call 911  Call 911 if you have any of the  following:   Severe weakness, dizziness, fainting, drowsiness, or confusion   Chest pain or shortness of breath   Heart beating fast, slow, or irregularly  When to seek medical advice  Call your healthcare provider right away if any of these occur:   Nausea or vomiting   Feverof 100.40F (38C) or higher, or as directed by your healthcare provider   Unexpected weight gain or swelling in the legs, ankles, or around the eyes   Decrease or absent urine output  Date Last Reviewed: 12/19/2014   2000-2016 [employer] [address] [place], [place] [address]. All rights reserved. This information is not intended as a substitute for professional medical care. Always follow your healthcare professional's instructions.        Abdominal Pain    Abdominal pain is pain in the stomach or belly area. Everyone has this pain from time to time. In many cases it goes away on its own. But abdominal pain can sometimes be due to a serious problem, such as appendicitis. So it's important to know when to seek help.  Causes of abdominal pain  There are many possible causes of abdominal pain. Common causes in adults include:   Constipation, diarrhea, or gas   Stomach acid flowing back up into the esophagus (acid reflux  or heartburn)   Severe acid reflux, called GERD (gastroesophageal reflux disease)   A sore in the lining of the stomach or small intestine (peptic ulcer)   Inflammation of the gallbladder, liver,or pancreas   Gallstones or kidney stones   Appendicitis   Intestinal blockage   An internal organ pushing through a muscle or other tissue (hernia)   Urinary tract infections   In women, menstrual cramps, fibroids, or endometriosis   Inflammation or infection of the intestines  Diagnosing the cause of abdominal pain  Your healthcare provider will do a physical exam help find the cause of your pain. If needed, tests will be ordered. Belly pain has many possible causes. So it can be hard to find the  reason for your pain. Giving details about your pain can help. Tell your provider where and when you feel the pain, and what makes it better or worse. Also let your provider know if you have other symptoms such as:   Fever   Tiredness   Upset stomach (nausea)   Vomiting   Changes in bathroom habits  Treating abdominal pain  Some causes of pain need emergency medical treatment right away. These include appendicitis or a bowel blockage. Other problems can be treated with rest, fluids, or medicines. Your healthcare provider can give you specific instructions for treatment or self-care based on what is causing your pain.  If you have vomiting or diarrhea,sip water or other clear fluids. When you are ready to eat solid foods again, start with small amounts of easy-to-digest, low-fat foods. These include apple sauce, toast, or crackers.   When to seek medical care  Call 911or go to the hospital right away if you:   Can't pass stool and are vomiting   Are vomiting blood or have bloody diarrhea or black, tarry diarrhea   Have chest, neck, or shoulder pain   Feel like you might pass out   Have pain in your shoulder blades with nausea   Have sudden, severe belly pain   Have new, severepain unlike any you have felt before   Have a belly that is rigid, hard, and tender to touch  Call your healthcare provider if you have:   Pain for more than5days   Bloating for more than 2days   Diarrhea for more than5days   A fever of 100.73F (38C) or higher, or as directed by your healthcare provider   Pain that gets worse   Weight loss for no reason   Continued lack of appetite   Blood in your stool  How to prevent abdominal pain  Here are some tips to help prevent abdominal pain:   Eat smaller amounts of food at one time.   Avoid greasy, fried, or other high-fat foods.   Avoid foods that give you gas.   Exercise regularly.   Drink plenty of fluids.  To help prevent GERD symptoms:   Quit smoking.   Reduce  alcohol and certain foods that increase stomach acid.   Avoid aspirin and over-the-counter pain and fever medicines (NSAIDS or nonsteroidal anti-inflammatory drugs), if possible   Lose extra weight.   Finish eating at least 2 hours before you go to bed or lie down.   Raise the head of your bed.  Date Last Reviewed: 10/18/2014   2000-2018 [employer] [place] [employer]. [address], [place], [place] [address]. All rights reserved. This information is not intended as a substitute for professional medical care. Always follow your healthcare professional's instructions.

## 2017-04-14 NOTE — H&P (Signed)
MEDICINE HISTORY AND PHYSICAL NOTE        Patient: [name]  Date: 04/13/2017   DOB: 12/01/1969  Admission Date: 04/13/2017   MRN: [id number]  Attending: Magdalene Molly, MD                                  Chief complaint: Diarrhea and abdominal pain    HPI:    [name] is a 46 y.o.  male with known history of coronary artery disease status post renal disease and hemodialysis Monday Wednesday Friday and recurrent C. Difficile right chest left the hospital AGAINST MEDICAL ADVICE this afternoon sided to return to the hospital again reporting not feeling any better and that he was unable to return home to [facility]. He feels that he was stupid in trying to leave AGAINST MEDICAL ADVICE and request to be admitted for further evaluation and workup.  He reports several bowel movements in the last 12 hours at worsening crampy abdominal pain. Reports poor appetite but no nausea vomiting. No fevers or chills.     Past Medical History   Past Medical History:   Diagnosis Date   . Asthma    . Coronary artery disease    . Heart attack 2010    at [facility] , had 2 stents placed post MI,   . Hypertension    . Kidney failure 2008    on renal since 2008   . Sleep apnea     uses sleep apnea for SOB     Past Surgical History  Past Surgical History:   Procedure Laterality Date   . ABDOMINAL SURGERY     . biopsey      Liver biopsey 2017 checking for cancer and pt. states it was negative   . INGUINAL HERNIA REPAIR Right 2016    [facility]   . pericentesis      every two weeks starting  january 2018; Pt. states they take off 4- 8 liters off. Next due this week.   Marland Kitchen REPAIR, UMBILICAL HERNIA  2016    [facility]       Allergies  Allergies   Allergen Reactions   . Lisinopril Anaphylaxis   . Morphine Anaphylaxis     Quite breathing   . Toradol [Ketorolac Tromethamine] Anaphylaxis       Prior to Admission Medications  Current Facility-Administered Medications   Medication Dose Route Frequency Provider Last Rate Last Dose   . fidaxomicin  (DIFICID) tablet 200 mg  200 mg Oral BID Alease Medina, MD       . HYDROmorphone (DILAUDID) injection 0.5 mg  0.5 mg Intravenous Once in ED Magdalene Molly, MD       . lactobacillus species (BIO-K PLUS) capsule 50 Billion CFU  50 Billion CFU Oral Daily Annelyse Rey, Heide Spark, MD       . ondansetron [facility]) injection 4 mg  4 mg Intravenous Once in ED Magdalene Molly, MD       . sodium chloride 0.9 % bolus 500 mL  500 mL Intravenous Once in ED Magdalene Molly, MD         Current Outpatient Prescriptions   Medication Sig Dispense Refill   . amLODIPine (NORVASC) 10 MG tablet Take 10 mg by mouth daily.     . carvedilol (COREG) 12.5 MG tablet Take 12.5 mg by mouth.     . hydrALAZINE (APRESOLINE) 100 MG tablet Take 100 mg  by mouth 3 (three) times daily.     . sevelamer (RENAGEL) 800 MG tablet Take 800 mg by mouth 3 (three) times daily with meals.     Marland Kitchen albuterol (PROVENTIL HFA;VENTOLIN HFA) 108 (90 Base) MCG/ACT inhaler Inhale 2 puffs into the lungs every 4 (four) hours as needed for Shortness of Breath.     Marland Kitchen albuterol-ipratropium (COMBIVENT RESPIMAT) 20-100 MCG/ACT Aero Soln Inhale 1 puff into the lungs 4 (four) times daily.           Social History  Social History   Substance Use Topics   . Smoking status: Current Every Day Smoker     Packs/day: 0.25     Years: 20.00     Types: Cigarettes   . Smokeless tobacco: Never Used   . Alcohol use No       Family History  Family History   Problem Relation Age of Onset   . Hypertension Mother    . Heart attack Maternal Grandmother    . Arthritis Paternal Grandfather      Review of Systems    Constitutional: no fever/chills/weight loss or gain  Eyes: no eye discharge/photophobia/eye redness  ENT: no hearing loss/ear discharge/sore throat  Cardiovascular: no chest pain/palpitations  Respiratory: no cough/wheezing/dyspnea/orthopnea  Gastrointestinal: no abdominal pain/nausea/vomiting/ hematochezia/malena/hematemesis  Genitourinary: no urgency/frequency/hematuria  Psychiatric:  no agitation/mood changes  Neurologic: no headache/dizziness/syncope/seizures/extremity weakness  Integumentary: no new rashes/ulcer  Musculoskeletal: no swelling of feet/joint swelling  Hematology: no excessive bleeding/swollen lymph nodes    Physical Exam  BP (!) 171/104   Pulse (!) 108   Temp 98 F (36.7 C) (Tympanic)   Resp 20   Ht 1.854 m (6\' 1" )   Wt 110.6 kg (243 lb 13.3 oz)   SpO2 100%   BMI 32.17 kg/m   No intake or output data in the 24 hours ending 04/14/17 0000  Weight Monitoring 04/10/2017 04/11/2017 04/12/2017 04/13/2017 04/13/2017 04/13/2017   Height 185.4 cm 185.4 cm - - - 185.4 cm   Height Method Stated Stated - - - Stated   Weight 111.131 kg 112.7 kg 111.857 kg (No Data) 110.6 kg 110.6 kg   Weight Method Stated Standing Scale - - Standing Scale Standing Scale   BMI (calculated) 32.4 kg/m2 32.8 kg/m2 - - - 32.2 kg/m2       1) General appearance:  Moderately built, in no apparent acute cardiorespiratory distress.     2) HEENT: Head is atraumatic and normocephalic. Pupils are equally reactive to light and accommodation. Extraocular muscles are intact. Oral mucosa moist with no pharyngeal congestion.     3) Neck: Supple. Trachea is central, no JVD or carotid bruit.     4) Chest: Clear to auscultation bilaterally, no wheezing/ronchi/crackles, non labored breathing     6) CVS: S1, S2 normal. Regular rate and rhythm. No murmur/thrill/rub    7) Abdomen:  mildly tender, no rigidity; no palpable mass. Bowel sounds audible. No hepatosplenomegaly     8) Musculoskeletal: +1 pitting edema legs. Expected range of motion in all extremities.     9) Neurological: Cranial nerves II-XII intact. Grossly non focal motor and sensory exam    10) Psychiatric: Alert and oriented x 3. Mood is appropriate.     11) Integumentary: warm, moist with normal skin turgor, no rash    12) Lymphatics: No lymphadenopathy in axillary, cervial and inguinal area.     Diagnostic work up:     Labs:     [employer] 04/13/17  0347 04/12/17  0053   WBC 4.5 4.6   RBC 2.29* 2.56*   Hemoglobin 7.5* 7.9*   Hematocrit 22.1* 24.5*   MCV 97 96   PLT CT 122* 131         Recent Labs  Lab 04/13/17  0508 04/11/17  0446   Sodium 138 140  141   Potassium 4.7 4.9  4.9   Chloride 102 104  105   CO2 26.9 22.2  23.8   BUN 36* 50*  53*   Creatinine 5.32* 8.27*  8.16*   Glucose 73 74  71   Calcium 8.1* 7.2*  7.2*   Magnesium  --  1.7         Recent Labs  Lab 04/11/17  0446 04/10/17  1009   ALT 14 15   AST (SGOT) 24 26   Bilirubin, Total 0.7 0.7   Albumin 2.9* 3.0*   Alkaline Phosphatase 242* 254*         Recent Labs  Lab 04/10/17  2038 04/10/17  1613   Troponin I 0.11* 0.13*         Recent Labs  Lab 04/11/17  0446   PT INR 1.1       Invalid input(s): HBA1CPERCNT  Imaging Studies:     (CXR reviewed by me today)   Xr Abdomen Ap    Result Date: 04/10/2017  No definite acute process in the abdomen. Chronic pleural thickening versus small bilateral effusions ReadingStation:[id number]    Xr Chest Ap Portable    Result Date: 04/10/2017  Minimal pulmonary edema. ReadingStation:[id number]    US Abdomen Limited Single Organ    Result Date: 04/12/2017  FINDINGS/IMPRESSION: Reported but poorly demonstrated small left pleural effusion. Small ascites in 4 quadrants of abdomen and pelvis. ReadingStation:[id number]      Microbiology and/or telemetry results:     Microbiology Results     None        EKG reviewed by me showed NSR    Assessment and Plan:     # C. difficile colitis  - Resume Difficid that he had been started on because he failed vancomycin and Flagyl in the past  - Obtain CT imaging of the abdomen    # Generalized abdominal discomfort  - secondary to C. difficile colitis  - also has a known history of ascites in the past requiring paracentesis    # End stage renal disease and hemodialysis  - did not get dialyzed today per schedule  - We will repeat blood work today and consult nephrology in a.m. to see if he requires dialysis tomorrow    # Chest  pain with history of coronary artery disease  - he was supposed to have a stress test today  - He currently denies any chest pain  - monitor symptoms on telemetry  - discuss with cardiology tomorrow if to see if patient still requires stress testing    CODE Status: Full code    Healthcare Proxy: friend    DVT prophylaxis: Heparin subcutaneously 5000 units q12h     Nilo Fallin  04/14/2017  12:00 AM  [address]  [id number]  Office Ph: [phone]    Please fax a copy of this note to PCP as soon as possible.

## 2017-04-14 NOTE — ED Provider Notes (Signed)
Physician/Midlevel provider first contact with patient: 04/14/17 1010         History     Chief Complaint   Patient presents with   . Chest Pain     HPI   46 year old male came to emergency room complaining of abdominal pain, diarrhea, chest pain, vomiting. Patient has history of coronary artery disease and end-stage renal failure and is on dialysis Monday Wednesday and Friday. Chest pain is sharp. Due to vomiting patient was unable to take his regular medications. Patient had the same symptoms he went to [facility] twice. He was supposed to go directly to [place] to [employer] [facility] [place]. He is stated and ready overnight instead. Patient came to emergency department and the patient were hospital today thinking that we have dialysis at our facility. Patient also was diagnosed with C. difficile and is on medication      Past Medical History:   Diagnosis Date   . Asthma    . Coronary artery disease    . Heart attack 2010    at [facility] , had 2 stents placed post MI,   . Hypertension    . Kidney failure 2008    on renal since 2008   . Sleep apnea     uses sleep apnea for SOB       Past Surgical History:   Procedure Laterality Date   . ABDOMINAL SURGERY     . biopsey      Liver biopsey 2017 checking for cancer and pt. states it was negative   . INGUINAL HERNIA REPAIR Right 2016    [facility]   . pericentesis      every two weeks starting  january 2018; Pt. states they take off 4- 8 liters off. Next due this week.   Marland Kitchen REPAIR, UMBILICAL HERNIA  2016    [facility]       Family History   Problem Relation Age of Onset   . Hypertension Mother    . Heart attack Maternal Grandmother    . Arthritis Paternal Grandfather        Social  Social History   Substance Use Topics   . Smoking status: Current Every Day Smoker     Packs/day: 0.25     Years: 20.00     Types: Cigarettes   . Smokeless tobacco: Never Used   . Alcohol use No       .     Allergies   Allergen Reactions   . Lisinopril Anaphylaxis   . Morphine Anaphylaxis      Quite breathing   . Toradol [Ketorolac Tromethamine] Anaphylaxis       Home Medications             albuterol (PROVENTIL HFA;VENTOLIN HFA) 108 (90 Base) MCG/ACT inhaler     Inhale 2 puffs into the lungs every 4 (four) hours as needed for Shortness of Breath.     albuterol-ipratropium (COMBIVENT RESPIMAT) 20-100 MCG/ACT Aero Soln     Inhale 1 puff into the lungs 4 (four) times daily.     amLODIPine (NORVASC) 10 MG tablet     Take 10 mg by mouth daily.     carvedilol (COREG) 12.5 MG tablet     Take 12.5 mg by mouth.     hydrALAZINE (APRESOLINE) 100 MG tablet     Take 100 mg by mouth 3 (three) times daily.     sevelamer (RENAGEL) 800 MG tablet     Take 800 mg by  mouth 3 (three) times daily with meals.           Review of Systems   Constitutional: Positive for activity change and appetite change.   Cardiovascular: Positive for chest pain.   Gastrointestinal: Positive for abdominal pain, diarrhea, nausea and vomiting.   Neurological: Positive for weakness.   All other systems reviewed and are negative.      Physical Exam    BP: (!) 176/137, Heart Rate: (!) 117, Temp: 98.9 F (37.2 C), Resp Rate: 20, SpO2: 93 %, Weight: 111.1 kg    Physical Exam   Constitutional: He is oriented to person, place, and time. He appears well-developed and well-nourished. No distress.   HENT:   Head: Normocephalic and atraumatic.   Right Ear: External ear normal.   Left Ear: External ear normal.   Nose: Nose normal.   Mouth/Throat: Oropharynx is clear and moist. No oropharyngeal exudate.   Neck: Normal range of motion. Neck supple. No JVD present. No tracheal deviation present. No thyromegaly present.   Pulmonary/Chest: Effort normal. No stridor. No respiratory distress. He has no wheezes. He has rales. He exhibits no tenderness.   Diminished breath sounds base of both lungs. Fine rales right lower lobe.   Abdominal: Soft. Bowel sounds are normal. He exhibits no distension. There is no tenderness. There is no guarding.   Musculoskeletal:  Normal range of motion. He exhibits no edema, tenderness or deformity.   Lymphadenopathy:     He has no cervical adenopathy.   Neurological: He is alert and oriented to person, place, and time.   Skin: Skin is warm. No rash noted. He is not diaphoretic. No erythema. No pallor.   Hyperpigmentation abdomen   Nursing note and vitals reviewed.        MDM and ED Course     ED Medication Orders     None             MDM  Review of lab results with patient.  Patient was advised to be admitted he refused and signed himself in a made.               Procedures    Clinical Impression & Disposition     Clinical Impression  Final diagnoses:   Chest pain, unspecified type   Essential hypertension   C. difficile colitis   Nausea vomiting and diarrhea        ED Disposition     ED Disposition Condition Date/Time Comment    AMA  Thu Apr 14, 2017 10:28 AM Date: 04/14/2017  Patient: [name]  Admitted: 04/14/2017 10:11 AM  Attending Provider: Fredrik Rigger, MD    [name] or his authorized caregiver has made the decision for the patient to leave the emergency department against the advice of  Sandrea Matte. He or his authorized caregiver has been informed and understands the inherent risks, including death, stroke.  He or his authorized caregiver has decided to accept the responsibility for this decision. [name] and all necessary parti es have been advised that he may return for further evaluation or treatment. His condition at time of discharge was stable.  [name] had current vital signs as follows:  BP (!) 176/137   Pulse (!) 117   Temp 98.9 F (37.2 C) (Tympanic)   Resp  20   Ht 1.854 m   Wt 111.1 kg              Discharge Medication List as of  04/14/2017 10:28 AM                    Liliyana Thobe, Jean Rosenthal, MD  04/18/17 1544

## 2017-04-14 NOTE — Consults (Signed)
BARD Accucath Extended Dwell Peripheral Intravenous Catheter:               [facility]  04/14/2017    Ultrasound was used to confirm patency of the Right forearm brachial  vein prior to obtaining venous access. The area to be punctured was cleansed with chlorhexidine 2% for more than 30 seconds maintaining sterile precautions.    A sterile cover was sheathed to the Ultrasound probe. The vessel was then revisualized prior to puncture of the vessel with a BARD Accucath needle under direct sonographic guidance and inserted per manufacturer guidelines. The catheter was flushed with normal saline to confirm brisk blood return  via capped extension tubing. The catheter was stabilized on the skin using a securement device and a sterile transparent occlusive dressing applied using sterile technique.    Patient did tolerate procedure well.       Insertion Site (vein): Right forearm brachial  Catheter Type: Accuath Extended Dwell    Catheter Gauge: 20 G  Total Length: 2.25 inch    Accucath Lot #: [id number]    Accucath inserted by: Ellouise Newer, RN        FINDINGS/CONCLUSIONS:   No signs or symptoms of related complications.      Accucath is ready for use  Accucath education / instructions provided to primary RN.     * For further questions, please notify the Vascular Access Team during hours of operation at Ext. [phone]  BARD Accucath Care:     The Bard Accucath is considered an EXTENDED DWELL Peripheral Intravenous Catheter. This allows for increased dwell time and decreased vessel wall trauma.    THIS IS NOT A MIDLINE CATHETER!    The AccuCath is PowerInjectable up to 300/PSI (6 mL/sec).    General Guidelines:   -If the catheter causes pain, redness, swelling or other adverse reactions, the catheter must be removed.    -IV's must be assessed each shift or more frequently depending on the prescribed therapy.    -These remain to be Peripheral IV's.    -The AccuCath may be used for lab draws, ONLY with a documented Physicians  order.   -However, depending on the patient's vasculature, use of the catheter, and catheter gauge, blood return is NOT GUARANTEED.    Dressing Changes   A Sterile dressing change is indicated at least once every seven (7)  days or if otherwise indicated.     Assess the dressing more frequently in the first 24 hours for accumulation of blood, fluid, or moisture beneath the dressing.      Periodically confirm catheter placement, patency, and security of dressing.      The PowerGlide Midline Dressing Change Kit (located in pyxis or obtained from dispatch) may be used for dressing changes. (The Statlock is applied in the same fashion as the PowerGlide Midline).    Securement Device  Statlock Stabilization Device must remain in place for the duration of the catheter and to be replaced at least once every seven (7) days in accordance with a sterile dressing change.    Extension Set  This institution uses extension sets with a non-removable cap. The entire extension piece must be changed at least once every four (4) days or 96 hours  in accordance with policy regarding changing caps.    Flushing   Administer a brisk pulsatile flush using a 10mL syringe of 0.9% normal saline at least once every twelve (12) hours or before/after catheter use.      Heparin  flush is not indicated in the maintenance of the catheter.     Do not flush against resistance.     Blood return is often better with a larger gauge catheter, but is NOT GUARANTEED.      It depends on a patient's vasculature, the gauge of the catheter placed, and the effects of medications on the vessel.     Cath-flo (Activase) declotting procedure is not indicated in an Accuath. If it is determined the catheter is occluded with blood, the catheter is to be removed.      Troubleshooting (Mechanical Occlusion)    If unable to aspirate or flush catheter it is likely related to mechanical occlusion.      Gently pull back on extension set and attempt aspiration  simultaneously.  If able to aspirate while pulling back, a dressing change is indicated as the catheter is most likely kinked at the insertion site.      This may have occurred due to patient movement, body habitus, or due to improper taping/ dressing application.      Ensure Accucath hub is properly secured in StatLock as they are designed to keep hub at an angle to prevent kinking at the insertion site. Do not tape the catheter hub flat to the skin or tape the extension set in a way that will tip the hub down.

## 2017-04-14 NOTE — ED Triage Notes (Signed)
Patient is complaining of abdominal pain nausea vomiting and diarrhea for the past 2 days. He missed his dialysis yesterday.  He was recently diagnosed with C-Diff on Monday and claims to be taking Flagyl and Cipro.  Patient is claiming to have 10/10 chest pain.  Patient in room to be triaged unable to hold open his eyes and slurring his words. HE states "I am allergic to Lisinopril, Morphine, and Toradol my throat swells."  "Dialudid is really the only medication that works."  Patient has narcotic substance abuse behavior.  EDIE report shows patient was in [place] earlier today with same symptoms seen in multiple places.

## 2017-04-14 NOTE — UM Notes (Signed)
[facility] Utilization Management Review Sheet    Facility :  [facility]    NAME: Willian Donson  MR#: [id number]    CSN#: [id number]    ROOM: 591/591-A AGE: 46 y.o.    ADMIT DATE AND TIME: 04/13/2017 10:41 PM MD Order 04/13/2017 @ 2354      PATIENT CLASS:Inpatient     ATTENDING PHYSICIAN: No att. providers found  PAYOR:Payor: /       AUTH #:     DIAGNOSIS:     ICD-10-CM    1. Acute chest pain R07.9        HISTORY:   Past Medical History:   Diagnosis Date   . Asthma    . Coronary artery disease    . Heart attack 2010    at [facility] , had 2 stents placed post MI,   . Hypertension    . Kidney failure 2008    on renal since 2008   . Sleep apnea     uses sleep apnea for SOB       DATE OF REVIEW: 04/14/2017    VITALS: BP (!) 180/118   Pulse (!) 105   Temp 98 F (36.7 C) (Oral)   Resp 20   Ht 1.854 m (6\' 1" )   Wt 112.6 kg (248 lb 4.8 oz)   SpO2 100%   BMI 32.76 kg/m     Active Hospital Problems    Diagnosis   . Abdominal pain       ED Treatment    Presentation:  46 y.o. male who presents with chief complaint of Abd pain. Location lower, quality "pain", severity mild-to-moderate, duration days.  Pt left AMA from the hospital today. He thought he would be able to get back to [facility] but is feeling worse now. Pt is being treated for C. difficile and was supposed to get a stress test for his chest pain. Pt is a dialysis pt, with his days on Monday Wednesday Friday. He has not had dialysis today. Pt estimates 5 vomits and 7-8 episodes of diarrhea today 171/104, pulse (!) 108, temperature 98 F (36.7 C), temperature source Tympanic, resp. rate 20, height 1.854 m, weight 110.6 kg, SpO2 100 %.  Labs:  Abdominal XR No definite acute process in the abdomen. Chronic pleural thickening versus small bilateral effusions, CXR Minimal pulmonary edema, U/S Abdomen : Reported but poorly demonstrated small left pleural effusion. Small ascites in 4 quadrants of abdomen and pelvis, H&H 7.5/22.1, Plt 122, BUN 36, Creat 5.32,  Ca 8.1  Meds:  Dilaudid, Zofran, NS 500 ml Bolus   Medical Admission Review  Medical   H&P:   46 y.o.  male with known history of coronary artery disease status post renal disease and hemodialysis Monday Wednesday Friday and recurrent C. Difficile right chest left the hospital AGAINST MEDICAL ADVICE this afternoon sided to return to the hospital again reporting not feeling any better and that he was unable to return home to [facility]. He feels that he was stupid in trying to leave AGAINST MEDICAL ADVICE and request to be admitted for further evaluation and workup.  He reports several bowel movements in the last 12 hours at worsening crampy abdominal pain. Reports poor appetite but no nausea vomiting  Labs:  See above From ER  Assessment/ Plan:  Per MD notes "  # C. difficile colitis  - Resume Difficid that he had been started on because he failed vancomycin and Flagyl in the past  - Obtain CT imaging  of the abdomen    # Generalized abdominal discomfort  - secondary to C. difficile colitis  - also has a known history of ascites in the past requiring paracentesis    # End stage renal disease and hemodialysis  - did not get dialyzed today per schedule  - We will repeat blood work today and consult nephrology in a.m. to see if he requires dialysis tomorrow    # Chest pain with history of coronary artery disease  - he was supposed to have a stress test today  - He currently denies any chest pain  - monitor symptoms on telemetry  - discuss with cardiology tomorrow if to see if patient still requires stress testing    CODE Status: Full code    Healthcare Proxy: friend    DVT prophylaxis: Heparin subcutaneously 5000 units q12h "    12/27 Pt left AMA again this is the second time he left AMA in 2 days    Antonietta Jewel R.N. BSN  Utilization Management  [facility]  [address]  [place], [place] [address]  Phone [phone]   Fax [phone]

## 2017-04-14 NOTE — ED Notes (Signed)
VAT placed Accucath at this time

## 2017-04-14 NOTE — Plan of Care (Addendum)
NURSE NOTE SUMMARY     Patient Name: [name]   Attending Physician: Alease Medina, MD   Primary Care Physician: Christa See, MD   Date of Admission:   04/13/2017   Today's date:   04/14/2017 LOS: 1 days   Shift Summary:                                                              2:22 AM Patient admitted to unit. Refusing to stay unless he can eat. Spoke with MD Barnet Pall. Will remain NPO per MD. Complaints of nausea. BP elevated but refusing medications because "I'll bottom out & pass out in HD."     3:08 AM Refusing assessment or anything until he can have IV pain medication. Spoke with CA & MD - patient wants to leave AMA. However, has no ride or anyone to call. Will allow taxi voucher at 6am if willing to wait until then.     6:41 AM Patient agreeable remainder of shift as long as he got his way. No complaints of pain. IV removed. CA got taxi for discharge back to vehicle at [place]. Left AMA   Provider Notifications:      Rapid Response Notifications:  Mobility:        PMP Activity: Step 6 - Walks in Room (04/14/2017  1:31 AM)     Weight tracking:  Family Dynamic:     Last 3 Weights for the past 72 hrs (Last 3 readings):   Weight   04/14/17 0206 112.6 kg (248 lb 4.8 oz)   04/13/17 1828 110.6 kg (243 lb 13.3 oz)       Recent Vitals:  Active Problems:     BP (!) 184/114   Pulse (!) 105   Temp 98 F (36.7 C) (Oral)   Resp 20   Ht 1.854 m (6\' 1" )   Wt 112.6 kg (248 lb 4.8 oz)   SpO2 100%   BMI 32.76 kg/m          Active Problems:    Abdominal pain              Problem: Altered GI Function  Goal: Fluid and electrolyte balance are achieved/maintained  Outcome: Progressing   04/14/17 0222   Goal/Interventions addressed this shift   Fluid and electrolyte balance are achieved/maintained Monitor intake and output every shift;Monitor/assess lab values and report abnormal values;Provide adequate hydration;Assess for confusion/personality changes;Monitor daily weight;Assess and reassess fluid and  electrolyte status;Observe for seizure activity and initiate seizure precautions if indicated;Observe for cardiac arrhythmias;Monitor for muscle weakness     Goal: Elimination patterns are normal or improving  Outcome: Progressing   04/14/17 0222   Goal/Interventions addressed this shift   Elimination patterns are normal or improving Report abnormal assessment to physician;Anticipate/assist with toileting needs;Assess for normal bowel sounds;Monitor for abdominal distension;Monitor for abdominal discomfort;Assess for signs and symptoms of bleeding. Report signs of bleeding to physician;Administer treatments as ordered;Consult/collaborate with Clinical Nutritionist;Assess for flatus;Assess for and discuss C. diff screening with LIP;Collaborate with LIP for containment device;Reinforce education on foods that improve and complicate bowel movements and how activity and medications can affect bowel movements;Encourage /perform oral hygiene as appropriate

## 2017-04-14 NOTE — UM Notes (Signed)
R     [facility]  EHR eReferral Notification Requirements    To be sent by secure email to [email or web address] or   by fax to [phone]   [facility] Resources  FIELDS ARE REQUIRED TO BE COMPLETED     Patient Name  [name]  Account Number   [id number]  Date of Birth:  12/01/1969  Date of Admission:   04/13/2017  Start of Initial Service Date:      PLEASE CHECK OFF TYPE OF REVIEW & CURRENT ADMISSION STATUS FROM LISTS BELOW    Type of Review:  X Admission Review  Observation Follow-Up Review  . Dates to be Reviewed:   IP Continued Stay Review  . Dates to be Reviewed:   Procedure Review  Cardiac Procedure Review      Concurrent Denial   (If selected, please complete page 2)  Admission Review & Concurrent Denial if found IP (If selected, please complete page 2)  X Post Discharge Review  . Discharged Date: 12/27   Readmission Review  . Dates of Previous Admission:     Current Order:X  Inpatient      Observation / Outpatient Services  Outpatient Procedure / Cardiology / Vascular   Other  Case Manager Name/Contact Number: Antonietta Jewel RN BSN UR [phone]  Attending Physician/Contact Number: DO NOT CONTACT MD   Comments:    Pt left AMA short stay admission       Additional Information being Emailed or Faxed:   Yes                 No   Fax Handwritten Supporting Documents to EHR at [phone]

## 2017-04-14 NOTE — ED Notes (Signed)
Patient states that he no longer wishes to stay and would rather continue driving to [place] where he can receive Dialysis. Patient verbalizes understanding of severity of condition and still insists that he continue traveling to [place]. Dr. Radene Gunning in at bedside to discuss this with patient. Patient Signed out AMA.

## 2017-04-14 NOTE — ED Notes (Signed)
Pt A&Ox4, calm and cooperative. No decline in pt status while in the ED. Pt states no complaints at this time, denies needs. Room is clear or all pt belongings. Report given to Ashely RN, receiving nurse.

## 2017-04-14 NOTE — Discharge Instructions (Signed)
What Is C. diff?  C. diff is an infection caused by Clostridium difficile (C. diff) bacteria. These are germs that live in the part of your belly called your colon, or large intestine. They don't usually cause problems, but if the normal balance of good and bad bacteria in your colon changes, C. diff bacteria can grow out of control and lead to infection. This can harm your colon and cause diarrhea and belly pain.  What are the symptoms of C. diff?  Some people with C. diff have no symptoms, but they can still pass the infection to others. Symptoms can include:   Watery diarrhea   Fever   Belly pain and cramping   Nausea and vomiting   Loss of appetite and weight loss  Who is most likely to get C. diff?  Anyone can get C. diff. But you are more likely to get the infection if you:   Are ages 14 and older   Are taking antibiotics   Have a weak immune system because of other health problems   Have inflammatory bowel disease   Have had C. diff before   Have had gastrointestinal (GI) surgery   Work or are a patient in a hospital, clinic, or nursing home  After treatment, C. diff can come back in about 1 in 4 people. If C. diff does come back, you are at higher risk for infection again in the future.   How can I lower my chance of getting C. diff again?  Take antibiotics only when you really need them. Antibiotics don't help treat illnesses caused by viruses, such as colds and the flu. Don't ask for antibiotics from your doctorif he or she says they won't work.  When you are given antibiotics, take them exactly as your doctor tells you to. Don't take more or less than the amount prescribed (the dosage). Also don't take them for a shorteror longer time than your doctor tells you to, even if you feel better.  How can I stop the spread of C. diff?  C. diff can easily spread to other people in your home or workplace. The germs can remain on your hands after using the bathroom, then spread to any person,  surface, or object you touch. Here's how to not spread C. diff to other people:   Practice good handwashing. This is especially important after using the bathroom and before eating. Here's what to do: Wet your hands, scrub them with soap for 30 to 40 seconds, then rinse well and dry.   Wash your clothes, bed sheets, and towels in separate loads. Use hot water. Use both detergent and chlorine bleach.   Use chlorine bleach-based products to disinfect surfaces you touch often, such as table tops, light switches, door knobs, and toilet seats.   Remind others to wear gloves and to wash their hands if assisting you in the bathroom.  Don't use alcohol-based hand cleaners. They don't work against C. diff.   Date Last Reviewed: 11/18/2015   2000-2018 [employer], [place]. [address], [place], [place] [address]. All rights reserved. This information is not intended as a substitute for professional medical care. Always follow your healthcare professional's instructions.          Noncardiac Chest Pain    Based on your visit today, the health care provider doesn't know what is causing your chest pain. In most cases, people who come to the emergency department with chest pain don't have a problem with  their heart. Instead, the pain is caused by other conditions. These may be problems with the lungs, muscles, bones, digestive tract, nerves, or mental health.  Lung problems   Inflammation around the lungs (pleurisy)   Collapsed lung (pneumothorax)   Fluid around the lungs (pleural effusion)   Lung cancer. This is a rare cause of chest pain.  Muscle or bone problems   Inflamed cartilage between the ribs (pleurisy)   Fibromyalgia   Rheumatoid arthritis  Digestive system problems   Reflux   Stomach ulcer   Spasms of the esophagus   Gall stones   Gallbladder inflammation  Mental health conditions   Panic or anxiety attacks   Emotional distress  Your condition doesn't seem serious and your pain doesn't appear  to be coming from your heart. But sometimes the signs of a serious problem take more time to appear. Watch for the warning signs listed below.  Home care  Follow these guidelines when caring for yourself at home:   Rest today and avoid strenuous activity.   Take any prescribed medicine as directed.  Follow-up care  Follow up with your health care provider, or as advised, if you don't start to feel better within 24 hours.  When to seek medical advice  Call your health care provider right away if any of these occur:   A change in the type of pain. Call if it feels different, becomes more serious, lasts longer, or begins to spread into your shoulder, arm, neck, jaw, or back.   Shortness of breath   You feel more pain when you breathe   Cough with dark-colored mucus or blood   Weakness, dizziness, or fainting   Fever of 100.1F (38C) or higher, or as directed by your health care provider   Swelling, pain, or redness in one leg  Date Last Reviewed: 03/12/2013   2000-2016 [employer] [address] [place], [place] [address]. All rights reserved. This information is not intended as a substitute for professional medical care. Always follow your healthcare professional's instructions.          Discharge Instructions: Taking Your Blood Pressure  Blood pressure is the force of blood as it moves from the heart through the blood vessels. You can take your own blood pressure reading using a digital monitor. Take readings as often as yourhealthcare providerinstructs. Take your readings each timein the same way, using the same arm. Here are guidelines for taking your blood pressure.  The American Heart Association (AHA) recommends purchasing a blood pressure monitor that is validated and approved by the Association for the Advancement of Medical Instrumentation, the [employer] Hypertension Society, and the International Protocol for the Validation of Automated BP Measuring Devices. If the blood pressure  monitor is for a senior adult, a pregnant woman, or a child, make certain it is validated for use with such a population. For the most reliable readings, the AHA recommends an automatic, cuff-style, upper arm (bicep) monitor. The readings from finger and wrist monitors are not as reliable as the upper arm monitor.      Step 1. Relax     Wait at least a half hour after smoking,eating,or exercising. Do not drink coffee, tea, soda, or other caffeinated beverages before checking your blood pressure.   Sit comfortably at a table. Place the monitor near you.   Rest for a few minutes before you begin.      Step 2. Wrap the cuff     Place your  arm on the table,palm up. Put your arm in a positionthat islevelwith your heart. Wrap the cuff around your upper arm, about an inch above your elbow. It's best to wrap the cuff on bare skin,not over clothing.   Make sure your cuff fits. If it doesn'twrap around your upper arm,order a larger cuff. A cuff that is too large or too small can result in an inaccurate blood pressure reading.        Step 3. Inflate the cuff     Pump the cuff until the scale reads 200. If you have a self-inflating cuff,push the button that starts the pump.   The cuff will tighten,then loosen.   The numbers will change. When they stop changing, your blood pressure reading will appear.   If you get a reading that is too high or too low for you, relax for a few minutes. Then do the test again.    Step 4. Write down the results   Write down your blood pressure numbers. Loraine Leriche the date and time. Keep your results in one place,such as a notebook.   Remove the cuff from your arm. Turn off the machine.   Take the readings with you to your medical appointments.   If you start a new blood pressure medicine, or change a blood pressure medicine dose, note the day you started the new drug or dosage on your blood pressure recording sheet. This will help your healthcare provider monitor the  effect of medication changes.    Date Last Reviewed: 08/14/2014   2000-2016 [employer] [address] [place], [place] [address]. All rights reserved. This information is not intended as a substitute for professional medical care. Always follow your healthcare professional's instructions.        Uncertain Causes of Chest Pain    Chest pain can happen for a number of reasons. Sometimes the cause can't be determined. If yourcondition does not seem serious, and your pain does not appear to be coming from your heart, your healthcare provider may recommend watching it closely. Sometimes the signs of a serious problem take more time to appear. Many problems not related to your heart can cause chest pain.These include:   Musculoskeletal. Costochondritis, an inflammation of the tissues around the ribs that can occur from trauma or overuse injuries   Respiratory. Pneumonia, pneumothorax, or pneumonitis (inflammation of the lining of the chest and lungs)   Gastrointestinal. Esophageal reflux, heartburn, or gallbladder disease   Anxiety and panic disorders   Nerve compression and neuritis   Miscellaneous problems such as aortic aneurysm or pulmonary embolism (a blood clot in the lungs)  Home care  After your visit, follow these recommendations:   Rest today and avoid strenuous activity.   Take any prescribed medicine as directed.   Be aware of any recurrent chest pain and notice any changes  Follow-up care  Follow up with your healthcare provider if you do not start to feel better within 24 hours, or as advised.  Call 911  Call 911 if any of these occur:   A change in the type of pain: if it feels different, becomes more severe, lasts longer, or begins to spread into your shoulder, arm, neck, jaw or back   Shortness of breath or increased pain with breathing   Weakness, dizziness, or fainting   Rapid heart beat   Crushing sensation in your chest  When to seek medical advice  Call your healthcare  provider right away if  any of the following occur:   Cough with dark colored sputum (phlegm) or blood   Fever of 100.20F(38C) or higher, or as directed by your healthcare provider   Swelling, pain or redness in one leg   Shortness of breath  Date Last Reviewed: 04/17/2014   2000-2016 [employer] [address] [place], [place] [address]. All rights reserved. This information is not intended as a substitute for professional medical care. Always follow your healthcare professional's instructions.

## 2017-04-14 NOTE — EDIE (Signed)
Takeela Peil?NOTIFICATION?04/14/2017 16:53?Runde, DARNELL?MRN: [id number]    This patient has registered at the [facility] Emergency Department   For more information visit: [email or web address]   Known Aliases  No known aliases.   Care Providers  Provider [facility] Type Phone Fax Service Dates   MATTHEW [facility] Primary Care   Current      ED Care Guidelines  There are currently no ED Care Guidelines in [place] for this patient. Please check your facility's medical records system.    Security Events  No recent Security Events currently on file    Recent Emergency Department Visit Summary  Showing 20 most recent visits out of 89 in the past 3 months  Admit Date Facility [facility] Type Major Type Diagnoses or Chief Complaint   Apr 14, 2017 [facility] Emergency  Emergency      abdominal pain diarrhea with chest pain      Apr 14, 2017 [facility] [place] Emergency  Emergency      Chest Pain      Nausea with vomiting, unspecified      Enterocolitis due to Clostridium difficile, not specified as recurrent      Diarrhea, unspecified      Chest pain, unspecified      Essential (primary) hypertension      Apr 13, 2017 [facility] Winch. [place] Emergency  Emergency      abd pain, diarrhea, vomit      Abdominal Pain      Diarrhea      Chest pain, unspecified      Apr 10, 2017 [facility]. Front. [place] Emergency  Emergency      Diarrhea      Shortness of Breath      Chest pain, unspecified      Diarrhea, unspecified      Apr 10, 2017 [facility] H. [place]. [place] Emergency  Emergency  Chief Complaint: CHRONIC KIDNEY DISEASE, PULMONARY EDEMA    Apr 09, 2017 [facility] Rosemarie Ax. [place] Emergency  Emergency      Nicotine dependence, cigarettes, uncomplicated      Gastro-esophageal reflux disease without esophagitis      Heart failure, unspecified      Anemia, unspecified      Unspecified kidney  failure      Diarrhea, unspecified      Hypertensive heart disease with heart failure      Chronic obstructive pulmonary disease, unspecified      Hypertensive heart and chronic kidney disease with heart failure and with stage 5 chronic kidney disease, or end stage renal disease      End stage renal disease      Apr 08, 2017 [facility] [place] [place] Emergency  Emergency     Apr 08, 2017 [facility] Chest. [facility] Emergency  Emergency     Apr 08, 2017 [facility] [place] [place]. [place]. [place] Emergency  Emergency      Procedure and treatment not carried out because of patient's decision for unspecified reasons      Chest pain, unspecified      Apr 07, 2017 [facility]. Rosemarie Ax. [place] Emergency  Emergency     Apr 06, 2017 [facility] [place]. [place] Emergency  Emergency  Chief Complaint: PT STS SOB    Apr 06, 2017 [facility] [place]. [place] Emergency  Emergency  Apr 05, 2017 [facility] [place]. [place] Emergency  Emergency      chest pain, shortness of breath, abdomin      Abdominal Pain      Diarrhea      Chest Pain      Chest pain, unspecified      Apr 04, 2017 [facility] Rosemarie Ax. [place] Emergency  Emergency     Apr 04, 2017 [facility] Outpatient [facility] Rosemarie Ax. [place] Emergency  Emergency      SOB      Shortness of Breath      Chronic kidney disease, unspecified      Heart failure, unspecified      Hypocalcemia      Essential (primary) hypertension      Abnormal levels of other serum enzymes      Apr 02, 2017 [facility]. Rosemarie Ax. [place] Emergency  Emergency     Apr 01, 2017 [place] [facility]. Rosemarie Ax. [place] Emergency  Emergency      Abdominal Pain      Shortness of breath      Hypocalcemia      Diarrhea, unspecified      Anemia in chronic kidney disease      Generalized abdominal pain      Abnormal findings on diagnostic imaging of other specified body structures      Chronic kidney disease,  unspecified      Mar 31, 2017 [facility] [place] [facility]. Staff. [place] Emergency  Emergency      Abdominal/Stomach Pain      Abdominal Pain      End stage renal disease      Dependence on renal dialysis      Anemia in chronic kidney disease      Noninfective gastroenteritis and colitis, unspecified      Mar 30, 2017 [facility]. Rosemarie Ax. [place] Emergency  Emergency     Mar 29, 2017 [facility] [place] [place] Emergency  Emergency         Recent Inpatient Visit Summary  Showing 20 most recent visits out of 33 in the past 3 months  Admit Date Facility [facility] Type Major Type Diagnoses or Chief Complaint   Apr 13, 2017 [facility] [place]. [place] General Medicine  Inpatient      Chest pain, unspecified      Apr 10, 2017 [facility]. [place]. [place] Internal Medicine  Inpatient      Chest pain, unspecified      Apr 10, 2017 [facility] H. [place]. [place] Medical Surgical  Inpatient  Chief Complaint: CHRONIC KIDNEY DISEASE, PULMONARY EDEMA    Apr 07, 2017 [facility]. Rosemarie Ax. [place] Telemetry  Inpatient      Nausea with vomiting, unspecified      Hypertensive heart disease with heart failure      Acute respiratory distress      Heart failure, unspecified      Shortness of breath      Fluid overload, unspecified      Anemia, unspecified      Dyspnea, unspecified      Enterocolitis due to Clostridium difficile, not specified as recurrent      Chest pain, unspecified      Apr 06, 2017 [facility]  Medical Surgical  Inpatient  Chief Complaint: ESRD DOE    Apr 05, 2017 [facility] Rosemarie Ax. [place] Telemetry  Inpatient      End stage renal disease      Enterocolitis due to Clostridium difficile, not specified as recurrent      Diarrhea, unspecified      Chest pain, unspecified      Unspecified abdominal pain      Apr 03, 2017 [facility]. Rosemarie Ax. [place] Telemetry  Inpatient      Cerebral palsy, unspecified       Chronic kidney disease, unspecified      Shortness of breath      Heart failure, unspecified      Coronary angioplasty status      Fluid overload, unspecified      Procedure and treatment not carried out due to patient leaving prior to being seen by health care provider      Allergy status to narcotic agent status      Family history of ischemic heart disease and other diseases of the circulatory system      Nicotine dependence, unspecified, uncomplicated      Apr 01, 2017 [place] [facility]. Rosemarie Ax. [place] D[facility]  Inpatient     Apr 01, 2017 [place] [facility]. Rosemarie Ax. [place] [facility]  Inpatient      Diarrhea, unspecified      Anemia in chronic kidney disease      Chronic kidney disease, unspecified      Hypocalcemia      Abnormal findings on diagnostic imaging of other specified body structures      Generalized abdominal pain      Mar 30, 2017 [facility] Rosemarie Ax. 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 [place]. [place] Progressive [facility]  Inpatient      Hypertensive urgency      Atherosclerotic heart disease of native coronary artery without angina pectoris      End stage renal disease      Shortness of breath      Other specified postprocedural states      Dependence on supplemental oxygen      Allergy status to other drugs, medicaments and biological substances status      Personal history of pneumonia (recurrent)      Nicotine dependence, unspecified, uncomplicated       Old myocardial infarction      Mar 21, 2017 [facility]. Rosemarie Ax.  Inpatient  Inpatient      Hypokalemia      Secondary hyperparathyroidism of renal origin      Other fatigue      Diarrhea, unspecified      Hyperkalemia      Heart failure, unspecified      Acute kidney failure, unspecified      End stage renal disease  Other ascites      Unspecified abdominal pain      Mar 20, 2017 [facility] [place]. [place] Internal Medicine  Inpatient      Other ascites      Unspecified abdominal pain      Anemia, unspecified      Fever, unspecified      Hypocalcemia      Chest pain, unspecified      Chest Pain      Hyperkalemia      Abdominal Pain      Diarrhea      Mar 19, 2017 [facility]. Rosemarie Ax. [place] Dialysis  Inpatient     Mar 18, 2017 [place] [facility]. Rosemarie Ax. [place] Cardiovacular Care Unit  Inpatient      Chest pain, unspecified      Unspecified abdominal pain      Hypocalcemia      Acute pulmonary edema      Chronic kidney disease, unspecified      Abnormal levels of other serum enzymes      Mar 07, 2017 [place] [facility]. [place]. [place] Medical Surgical  Inpatient      Diarrhea, unspecified      Chest pain, unspecified      End stage renal disease      Dependence on renal dialysis      Mar 05, 2017 [facility]. Rosemarie Ax. [place] Telemetry  Inpatient      Acute kidney failure, unspecified      Unspecified asthma, uncomplicated      Shortness of breath      Hypertensive chronic kidney disease with stage 5 chronic kidney disease or end stage renal disease      Patient's noncompliance with renal dialysis      Hypocalcemia      Anemia in chronic kidney disease      Noninfective gastroenteritis and colitis, unspecified      Allergy status to other drugs, medicaments and biological substances status      Thrombocytopenia, unspecified      Mar 04, 2017 [facility] Rosemarie Ax. [place] Inpatient  Inpatient      Nausea with vomiting, unspecified      Hypoglycemia,  unspecified      Patient's noncompliance with renal dialysis      Dependence on renal dialysis      Diarrhea, unspecified      Nicotine dependence, unspecified, uncomplicated      End stage renal disease      Hypertensive chronic kidney disease with stage 5 chronic kidney disease or end stage renal disease      Hypocalcemia      Tobacco abuse counseling      Mar 02, 2017 [facility]. [place] Medical Surgical  Inpatient      Dependence on renal dialysis      Chronic obstructive pulmonary disease, unspecified      Hypertensive urgency      Hypertensive chronic kidney disease with stage 5 chronic kidney disease or end stage renal disease      Nicotine dependence, unspecified, uncomplicated      Anemia in chronic kidney disease      End stage renal disease      Atherosclerotic heart disease of native coronary artery without angina pectoris      Enterocolitis due to Clostridium difficile, not specified as recurrent      Hyperkalemia  Mar 01, 2017 [facility]. Rosemarie Ax.  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 Visit Count (12 mo.)  Facility Visits   [facility] 7   [facility] 2   [facility] 1   [facility] 1   [facility] 1   [facility] 2   [facility] 1   [facility] 7   [facility] 1   [name] 2   [facility] [address]   [facility] 1   [facility] 1   [facility] 1    [facility] 6   [facility] 1   [facility] 32   [facility] Emergency [facility]patient Center 4   [facility] 1   [facility] 6   [facility] [place] 5   J.WEliezer Champ[facility] 2   [place] [facility] 2   [facility] - [place] [facility] 3   [facility] 1   [facility] 3   [facility] 2   [facility] [place] 2   [facility] 5   [facility] 4   [facility] 2   [facility] 2   [facility] 1   [facility] 1   [facility] - [facility] 1   [facility] 1   [facility] 2   [facility] 9   [facility] 1   [facility] 4   [facility] 2   [facility] - [facility] 3   [facility] 1   [facility] 1   [facility] 1   [place] [facility] 2   [facility] 1   [facility] 17   [facility] - [facility] 2   [facility] 3   [facility] 14   [facility] 1   [facility] 2   [place] [facility] 1   [facility] 1   [facility] - [facility] 1   [facility] 1   [facility] 1   Total 212   Note: Visits indicate total known visits.      The above information is provided for the sole purpose of patient treatment. Use of this information beyond the terms of Data Sharing Memorandum of Understanding and License Agreement is prohibited. In  certain cases not all visits may be represented. Consult the aforementioned facilities for  additional information.   ? 2018 [employer] - [place], [place] - [email or web address]

## 2017-04-14 NOTE — ED Notes (Signed)
Dr. Radene Gunning at bedside to assess. Patient placed on cardiac monitor, nibp, and pulse ox.

## 2017-04-14 NOTE — ED Provider Notes (Signed)
Physician/Midlevel provider first contact with patient: 04/14/17 1747         History     Chief Complaint   Patient presents with   . Abdominal Pain   . Diarrhea   . Nausea   . Emesis     HPI : This is a 46 year old black male who offers complaints of abdominal pain, chest pain, vomiting and diarrhea. The patient states that he's had these for a couple of days. He states the chest pain feels like pressure but no significant shortness of breath.        Past Medical History:   Diagnosis Date   . Asthma    . Coronary artery disease    . Heart attack 2010    at [facility] , had 2 stents placed post MI,   . Hypertension    . Kidney failure 2008    on renal since 2008   . Sleep apnea     uses sleep apnea for SOB       Past Surgical History:   Procedure Laterality Date   . ABDOMINAL SURGERY     . biopsey      Liver biopsey 2017 checking for cancer and pt. states it was negative   . INGUINAL HERNIA REPAIR Right 2016    [facility]   . pericentesis      every two weeks starting  january 2018; Pt. states they take off 4- 8 liters off. Next due this week.   Marland Kitchen REPAIR, UMBILICAL HERNIA  2016    [facility]       Family History   Problem Relation Age of Onset   . Hypertension Mother    . Heart attack Maternal Grandmother    . Arthritis Paternal Grandfather        Social  Social History   Substance Use Topics   . Smoking status: Current Every Day Smoker     Packs/day: 0.25     Years: 20.00     Types: Cigarettes   . Smokeless tobacco: Never Used   . Alcohol use No       .     Allergies   Allergen Reactions   . Lisinopril Anaphylaxis   . Morphine Anaphylaxis     Quite breathing   . Toradol [Ketorolac Tromethamine] Anaphylaxis       Home Medications             albuterol (PROVENTIL HFA;VENTOLIN HFA) 108 (90 Base) MCG/ACT inhaler     Inhale 2 puffs into the lungs every 4 (four) hours as needed for Shortness of Breath.     albuterol-ipratropium (COMBIVENT RESPIMAT) 20-100 MCG/ACT Aero Soln     Inhale 1 puff into the lungs 4 (four) times daily.      amLODIPine (NORVASC) 10 MG tablet     Take 10 mg by mouth daily.     carvedilol (COREG) 12.5 MG tablet     Take 12.5 mg by mouth.     hydrALAZINE (APRESOLINE) 100 MG tablet     Take 100 mg by mouth 3 (three) times daily.     sevelamer (RENAGEL) 800 MG tablet     Take 800 mg by mouth 3 (three) times daily with meals.           Review of Systems: Review of systems as noted above otherwise completed and found to be negative.    Physical Exam    BP: (!) 171/120, Heart Rate: 100, Temp: 98.4 F (36.9 C), Resp Rate:  16, SpO2: 100 %, Weight: 111 kg    Physical Exam;GEN; Well developed,well nourished. In no acute distress. Alert.  HEENT: Head: Atraumatic, Normocephalic  Eyes: PERL, non-icteric  Ears: No discharge. Nose: No discharge.  Throat: Airway patent. Mucous membranes moist.  Neck: No JVD or trachael deviation. Supple.  Lungs: Clear, breath sounds equal.  CV: Regular, rate and rhythm. No murmur,gallop or rub.  Back: No spinal or CVA tenderness  Abdomen: Soft . No guarding, rebound, masses or organomegaly. BS active.  Lymph: No nodes palpable in the neck or groin.  Extremities: No edema or palpable cords.  Skin: No jaundice or rash.  Neuro: Grossly intact and non-focal.  Psych: Answers questions appropiately      MDM and ED Course   EKG: Sinus tachycardia: Rate: 104. Axis and intervals are normal. No acute changes are noted. This tracing is similar to one done 04/11/2017.  ED Medication Orders     None         This patient's chart was extensively reviewed it is of note that this patient has been seen at approximately 18 healthcare facilities over the last 2 weeks for the same complaints. He was diagnosed as having C. difficile and was put on the appropriate medications for this. The patient was [facility] and low-grade bridging it today earlier and apparently left AGAINST MEDICAL ADVICE. He actually was hospitalized at [facility] and signed out AGAINST MEDICAL ADVICE. During that  hospital stay he was noncompliant. He actually had a troponin done earlier today which was within normal limits.  I asked the patient what he wanted he said he wanted to have dialysis. I told him we did not offer that here. His last dialysis was done in [place] and I suggested that he return there tomorrow to have it done.  He states that he is from [place] but is in the area visiting a friend.  I even asked him if he had a drug problem which he denied. Patient was told that there would be no further testing done here and that we did not have anything more to offer him.    MDM                 Procedures    Clinical Impression & Disposition     Clinical Impression  Final diagnoses:   Chest pain, unspecified type   Chronic kidney disease, unspecified CKD stage        ED Disposition     ED Disposition Condition Date/Time Comment    Discharge  Thu Apr 14, 2017  6:04 PM Dow Adolph discharge to home/self care.    Condition at disposition: Stable           Discharge Medication List as of 04/14/2017  6:04 PM                    Loyal Gambler., MD  04/14/17 [id number]

## 2017-04-14 NOTE — EDIE (Signed)
[name]?NOTIFICATION?04/14/2017 10:03?[name], Jorge?MRN: [id number]    This patient has registered at the [facility] Emergency Department   For more information visit: [email or web address]   Known Aliases  No known aliases.   [facility]'s patient encounter information:   VWU:?[id number]  Account [id number]  Billing Account [id number]      Security Events  No recent Security Events currently on file    ED Care Guidelines  There are currently no ED Care Guidelines in [place] for this patient. Please check your facility's medical records system.  Care Providers  Provider [facility] Type Phone Fax Service Dates   MATTHEW [facility] Primary Care   Current        E.D. Visit Count (12 mo.)  Facility Visits   [facility] 7   [facility] 2   [place] [facility] 1   [facility] 1   [facility] 1   [facility] 2   [facility] 1   [facility] 7   [facility] 1   [facility] 2   [facility] [address]   [facility] 1   [facility] 1   [facility] 1   [facility] 6   [facility] 1   [facility] - [facility] 32   [facility] 4   [facility] 1   [facility] 6   [facility] [place] 5   J.WEliezer Champagne 2   [place] [facility] 2   [facility] - [place] [facility] 3   [facility] 1   [facility] 3   [facility] 2   [facility] 2   [facility] - [facility] 5   [facility] 4   [facility] 2   [facility] 2   [facility] 1   [facility] 1   [facility] 1   [facility] 1   [facility] 2   [facility] [address] [facility] 4   [facility] 2   [facility] - [facility] 3   [facility] 1   [facility] 1   [facility] 1   [place] [facility] 2   [facility] 1   [facility] 17   [facility] - [facility] 2   [facility] 3   [facility] 14   [facility] 1   [facility] 2   [place] [facility] 1   [facility] 1   [facility] 1   [facility] [facility] 1   [facility] 1   Total 211   Note: Visits indicate total known visits.      Recent Emergency Department Visit Summary  Showing 20 most recent visits out of 88 in the past 3 months  Admit Date  Facility [facility] Type Major Type Diagnoses or Chief Complaint   Apr 14, 2017 [facility]  Emergency      Chest Pain      Apr 13, 2017 [facility] Winch. [place] Emergency  Emergency      abd pain, diarrhea, vomit      Abdominal Pain      Diarrhea      Chest pain, unspecified      Apr 10, 2017 [facility]. [place]. [place] Emergency  Emergency      Diarrhea      Shortness of Breath      Chest pain, unspecified      Diarrhea, unspecified      Apr 10, 2017 [facility]. [place]. [place] 7 Emergency  Emergency  Chief Complaint: CHRONIC KIDNEY DISEASE, PULMONARY EDEMA    Apr 09, 2017 [facility] Rosemarie Ax. [place] Emergency  Emergency      Nicotine dependence, cigarettes, uncomplicated      Gastro-esophageal reflux disease without esophagitis      Heart failure,  unspecified      Anemia, unspecified      Unspecified kidney failure      Diarrhea, unspecified      Hypertensive heart disease with heart failure      Chronic obstructive pulmonary disease, unspecified      Hypertensive heart and chronic kidney disease with heart failure and with stage 5 chronic kidney disease, or end stage renal disease      End stage renal disease      Apr 08, 2017 [facility] [place] Emergency  Emergency     Apr 08, 2017 [facility] Chest. [facility] Emergency  Emergency     Apr 08, 2017 [facility] [place] [place]. [place]. [place] Emergency  Emergency      Procedure and treatment not carried out because of patient's decision for unspecified reasons      Chest pain, unspecified      Apr 07, 2017 [facility] Rosemarie Ax. [place] Emergency  Emergency     Apr 06, 2017 [facility] [place]. [place] Emergency  Emergency  Chief Complaint: PT STS SOB    Apr 06, 2017 [facility] [place]. [place] Emergency  Emergency     Apr 05, 2017 [facility] [place]. [place] Emergency  Emergency      chest pain, shortness of breath, abdomin      Abdominal Pain      Diarrhea      Chest Pain      Chest pain, unspecified      Apr 04, 2017 [facility] Rosemarie Ax. [place] Emergency  Emergency     Apr 04, 2017 [facility] Rosemarie Ax. [place] Emergency  Emergency      SOB      Shortness of Breath      Chronic kidney disease, unspecified      Heart failure, unspecified      Hypocalcemia      Essential (primary) hypertension      Abnormal levels of other serum enzymes      Apr 02, 2017 [facility] Rosemarie Ax. [place] Emergency  Emergency     Apr 01, 2017 [place] [facility]. Rosemarie Ax.  Emergency  Emergency      Abdominal Pain  Shortness of breath      Hypocalcemia      Diarrhea, unspecified      Anemia in chronic kidney disease      Generalized abdominal pain      Abnormal findings on diagnostic imaging of other  specified body structures      Chronic kidney disease, unspecified      Mar 31, 2017 [facility]. Staff. [place] Emergency  Emergency      Abdominal/Stomach Pain      Abdominal Pain      End stage renal disease      Dependence on renal dialysis      Anemia in chronic kidney disease      Noninfective gastroenteritis and colitis, unspecified      Mar 30, 2017 [facility] Rosemarie Ax. [place] Emergency  Emergency     Mar 29, 2017 [facility] [place] Emergency  Emergency     Mar 29, 2017 [facility] Rosemarie Ax. [place] Emergency  Emergency      Abdominal/Stomach Pain      Vomiting      Diarrhea      Diarrhea, unspecified      Essential (primary) hypertension      End stage renal disease      Opioid use, unspecified with unspecified opioid-induced disorder      Nausea      Hyperkalemia      Hypocalcemia          Recent Inpatient Visit Summary  Showing 20 most recent visits out of 33 in the past 3 months  Admit Date Facility [facility] Type Major Type Diagnoses or Chief Complaint   Apr 13, 2017 [facility] [place]. [place] [facility]  Inpatient      Chest pain, unspecified      Apr 10, 2017 [facility] [place]. [place] [facility]  Inpatient      Chest pain, unspecified      Apr 10, 2017 [facility]. [place]. [place] [facility] Surgical  Inpatient  Chief Complaint: CHRONIC KIDNEY DISEASE, PULMONARY EDEMA    Apr 07, 2017 [facility] Rosemarie Ax. [place] Telemetry  Inpatient      Nausea with vomiting, unspecified      Hypertensive heart disease with heart failure      Acute respiratory distress      Heart failure, unspecified      Shortness of breath      Fluid overload, unspecified      Anemia, unspecified      Dyspnea, unspecified      Enterocolitis due to Clostridium difficile, not specified as recurrent      Chest pain, unspecified      Apr 06, 2017 [facility] [employer] [facility] [place] [facility]  Inpatient  Chief  Complaint: ESRD DOE    Apr 05, 2017 [facility] Rosemarie Ax. [place] Telemetry  Inpatient      End stage renal disease      Enterocolitis due to Clostridium difficile, not specified as recurrent      Diarrhea, unspecified      Chest pain, unspecified      Unspecified abdominal pain      Apr 03, 2017 [facility] Rosemarie Ax. [place] Telemetry  Inpatient      Cerebral palsy, unspecified      Chronic kidney disease, unspecified      Shortness of  breath      Heart failure, unspecified      Coronary angioplasty status      Fluid overload, unspecified      Procedure and treatment not carried out due to patient leaving prior to being seen by health care provider      Allergy status to narcotic agent status      Family history of ischemic heart disease and other diseases of the circulatory system      Nicotine dependence, unspecified, uncomplicated      Apr 01, 2017 [place] [facility] Rosemarie Ax. [place] Dialysis  Inpatient     Apr 01, 2017 [place] [facility]. Rosemarie Ax. [place] [facility] Care Unit  Inpatient      Diarrhea, unspecified      Anemia in chronic kidney disease      Chronic kidney disease, unspecified      Hypocalcemia      Abnormal findings on diagnostic imaging of other specified body structures      Generalized abdominal pain      Mar 30, 2017 [facility] Rosemarie Ax. 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 [place]. [place] [facility]  Inpatient      Hypertensive  urgency      Atherosclerotic heart disease of native coronary artery without angina pectoris      End stage renal disease      Shortness of breath      Other specified postprocedural states      Dependence on supplemental oxygen      Allergy status to other drugs, medicaments and biological substances status      Personal history of pneumonia (recurrent)      Nicotine dependence, unspecified, uncomplicated      Old myocardial infarction      Mar 21, 2017 [facility] Rosemarie Ax. [place] Inpatient  Inpatient      Hypokalemia      Secondary hyperparathyroidism of renal origin      Other fatigue      Diarrhea, unspecified      Hyperkalemia      Heart failure, unspecified      Acute kidney failure, unspecified      End stage renal disease      Other ascites      Unspecified abdominal pain      Mar 20, 2017 [facility] Man[place]. [place] Internal Medicine  Inpatient      Other ascites      Unspecified abdominal pain      Anemia, unspecified      Fever, unspecified      Hypocalcemia      Chest pain, unspecified      Chest Pain      Hyperkalemia      Abdominal Pain  Diarrhea      Mar 19, 2017 [place] [facility]. Rosemarie Ax. [place] Dialysis  Inpatient     Mar 18, 2017 [place] [facility]. Rosemarie Ax. [place] Cardiovacular Care Unit  Inpatient      Chest pain, unspecified      Unspecified abdominal pain      Hypocalcemia      Acute pulmonary edema      Chronic kidney disease, unspecified      Abnormal levels of other serum enzymes      Mar 07, 2017 [place] [facility] [place] [facility]. [place]. [place] [facility] Surgical  Inpatient      Diarrhea, unspecified      Chest pain, unspecified      End stage renal disease      Dependence on renal dialysis      Mar 05, 2017 [facility] Rosemarie Ax. [place] Telemetry  Inpatient      Acute kidney failure, unspecified      Unspecified asthma, uncomplicated      Shortness of breath      Hypertensive chronic kidney disease with stage 5 chronic kidney disease or end stage renal disease       Patient's noncompliance with renal dialysis      Hypocalcemia      Anemia in chronic kidney disease      Noninfective gastroenteritis and colitis, unspecified      Allergy status to other drugs, medicaments and biological substances status      Thrombocytopenia, unspecified      Mar 04, 2017 [facility] Rosemarie Ax. [place] Inpatient  Inpatient      Nausea with vomiting, unspecified      Hypoglycemia, unspecified      Patient's noncompliance with renal dialysis      Dependence on renal dialysis      Diarrhea, unspecified      Nicotine dependence, unspecified, uncomplicated      End stage renal disease      Hypertensive chronic kidney disease with stage 5 chronic kidney disease or end stage renal disease      Hypocalcemia      Tobacco abuse counseling      Mar 02, 2017 [facility]. [place]. [place] Medical Surgical  Inpatient      Dependence on renal dialysis      Chronic obstructive pulmonary disease, unspecified      Hypertensive urgency      Hypertensive chronic kidney disease with stage 5 chronic kidney disease or end stage renal disease      Nicotine dependence, unspecified, uncomplicated      Anemia in chronic kidney disease      End stage renal disease      Atherosclerotic heart disease of native coronary artery without angina pectoris      Enterocolitis due to Clostridium difficile, not specified as recurrent      Hyperkalemia      Mar 01, 2017 [facility] Rosemarie Ax. 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, recurrent  Prescription Monitoring Program  000??- Narcotic Use Score  000??- Sedative Use Score  000??- Stimulant Use Score  - All Scores range from 000-999 with 75%  of the population scoring < 200 and on 1% scoring above 650  - The last digit of the narcotic, sedative, and stimulant score indicates the number of active prescriptions of that type  - Higher Use scores correlate with increased prescribers, pharmacies, mg equiv, and overlapping prescriptions   Concerning or unexpectedly high scores should prompt a review of the PMP record; this does not constitute checking PMP for prescribing purposes.    The above information is provided for the sole purpose of patient treatment. Use of this information beyond the terms of Data Sharing Memorandum of Understanding and License Agreement is prohibited. In certain cases not all visits may be represented. Consult the aforementioned facilities for additional information.   ? 2018 [employer] - [place], [place] - [email or web address]

## 2017-04-15 LAB — ECG 12-LEAD
P Wave Axis: 68 deg
P-R Interval: 186 ms
Patient Age: 47 years
Q-T Interval(Corrected): 469 ms
Q-T Interval: 356 ms
QRS Axis: -14 deg
QRS Duration: 100 ms
T Axis: 67 years
Ventricular Rate: 104 //min

## 2017-04-21 ENCOUNTER — Emergency Department: Admit: 2017-04-21 | Payer: MEDICARE

## 2017-04-21 ENCOUNTER — Observation Stay: Payer: MEDICARE

## 2017-04-21 ENCOUNTER — Encounter: Admit: 2017-05-03 | Discharge: 2017-05-03 | Payer: PRIVATE HEALTH INSURANCE | Attending: Gastroenterology

## 2017-04-21 ENCOUNTER — Inpatient Hospital Stay
Admit: 2017-04-21 | Discharge: 2017-04-21 | Discharge status: Against Medical Advice | Payer: MEDICARE | Attending: Emergency Medicine

## 2017-04-21 ENCOUNTER — Inpatient Hospital Stay
Admit: 2017-04-21 | Discharge: 2017-04-21 | Disposition: A | Discharge status: Other Acute Inpatient Hospital | Payer: MEDICARE | Attending: Emergency Medicine

## 2017-04-21 ENCOUNTER — Emergency Department

## 2017-04-21 DIAGNOSIS — K529 Noninfective gastroenteritis and colitis, unspecified: Secondary | ICD-10-CM

## 2017-04-21 DIAGNOSIS — R079 Chest pain, unspecified: Secondary | ICD-10-CM

## 2017-04-21 LAB — POC EG7
Base deficit (POC): 4 mmol/L
Calcium, ionized (POC): 0.96 mmol/L — ABNORMAL LOW (ref 1.12–1.32)
Flow rate (POC): 3 L/min
HCO3 (POC): 22.2 MMOL/L (ref 22–26)
pCO2 (POC): 46.3 MMHG — ABNORMAL HIGH (ref 35.0–45.0)
pH (POC): 7.288 — ABNORMAL LOW (ref 7.35–7.45)
pO2 (POC): 28 MMHG — CL (ref 80–100)
sO2 (POC): 46 % — ABNORMAL LOW (ref 92–97)

## 2017-04-21 LAB — CBC WITH AUTOMATED DIFF
ABS. BASOPHILS: 0 10*3/uL (ref 0.0–0.1)
ABS. EOSINOPHILS: 0.3 10*3/uL (ref 0.0–0.4)
ABS. IMM. GRANS.: 0 10*3/uL (ref 0.00–0.04)
ABS. LYMPHOCYTES: 0.3 10*3/uL — ABNORMAL LOW (ref 0.8–3.5)
ABS. MONOCYTES: 0.5 10*3/uL (ref 0.0–1.0)
ABS. NEUTROPHILS: 6.1 10*3/uL (ref 1.8–8.0)
ABSOLUTE NRBC: 0 10*3/uL (ref 0.00–0.01)
BASOPHILS: 0 % (ref 0–1)
EOSINOPHILS: 4 % (ref 0–7)
HCT: 30.5 % — ABNORMAL LOW (ref 36.6–50.3)
HGB: 9.7 g/dL — ABNORMAL LOW (ref 12.1–17.0)
IMMATURE GRANULOCYTES: 0 % (ref 0.0–0.5)
LYMPHOCYTES: 4 % — ABNORMAL LOW (ref 12–49)
MCH: 31.2 PG (ref 26.0–34.0)
MCHC: 31.8 g/dL (ref 30.0–36.5)
MCV: 98.1 FL (ref 80.0–99.0)
MONOCYTES: 7 % (ref 5–13)
MPV: 9.6 FL (ref 8.9–12.9)
NEUTROPHILS: 85 % — ABNORMAL HIGH (ref 32–75)
NRBC: 0 PER 100 WBC
PLATELET: 128 10*3/uL — ABNORMAL LOW (ref 150–400)
RBC: 3.11 M/uL — ABNORMAL LOW (ref 4.10–5.70)
RDW: 16.9 % — ABNORMAL HIGH (ref 11.5–14.5)
WBC: 7.2 10*3/uL (ref 4.1–11.1)

## 2017-04-21 LAB — METABOLIC PANEL, BASIC
Anion gap: 11 mmol/L (ref 5–15)
BUN/Creatinine ratio: 6 — ABNORMAL LOW (ref 12–20)
BUN: 40 MG/DL — ABNORMAL HIGH (ref 6–20)
CO2: 26 mmol/L (ref 21–32)
Calcium: 6.9 MG/DL — ABNORMAL LOW (ref 8.5–10.1)
Chloride: 104 mmol/L (ref 97–108)
Creatinine: 6.81 MG/DL — ABNORMAL HIGH (ref 0.70–1.30)
GFR est AA: 11 mL/min/{1.73_m2} — ABNORMAL LOW (ref >60)
GFR est non-AA: 9 mL/min/{1.73_m2} — ABNORMAL LOW (ref >60)
Glucose: 91 mg/dL (ref 65–100)
Potassium: 4.4 mmol/L (ref 3.5–5.1)
Sodium: 141 mmol/L (ref 136–145)

## 2017-04-21 LAB — NT-PRO BNP: NT pro-BNP: >35000 PG/ML — ABNORMAL HIGH (ref 0–125)

## 2017-04-21 LAB — LIPID PANEL
CHOL/HDL Ratio: 2 (ref 0–5.0)
Cholesterol, total: 131 MG/DL (ref <200)
HDL Cholesterol: 66 MG/DL
LDL, calculated: 40.6 MG/DL (ref 0–100)
Triglyceride: 122 MG/DL (ref <150)
VLDL, calculated: 24.4 MG/DL

## 2017-04-21 LAB — OCCULT BLOOD, STOOL: Occult blood, stool: NEGATIVE

## 2017-04-21 LAB — WBC, STOOL: White blood cells, stool: 0 /HPF (ref 0–4)

## 2017-04-21 LAB — TROPONIN I: Troponin-I, Qt.: 0.1 ng/mL — ABNORMAL HIGH (ref <0.05)

## 2017-04-21 LAB — ECHOCARDIOGRAM COMPLETE 2D W DOPPLER W COLOR: Left Ventricular Ejection Fraction: 38

## 2017-04-21 MED ORDER — SODIUM CHLORIDE 0.9 % IJ SYRG
INTRAMUSCULAR | Status: DC | PRN
Start: 2017-04-21 — End: 2017-04-21
  Administered 2017-04-21 (×3): via INTRAVENOUS

## 2017-04-21 MED ORDER — VANCOMYCIN 50 MG/ML ORAL SOLUTION
50 mg/mL | Freq: Four times a day (QID) | ORAL | Status: DC
Start: 2017-04-21 — End: 2017-04-22

## 2017-04-21 MED ORDER — METOPROLOL TARTRATE 5 MG/5 ML IV SOLN
5 mg/ mL | Freq: Four times a day (QID) | INTRAVENOUS | Status: DC
Start: 2017-04-21 — End: 2017-04-22
  Administered 2017-04-21: 20:00:00 via INTRAVENOUS

## 2017-04-21 MED ORDER — ACETAMINOPHEN 325 MG TABLET
325 mg | Freq: Four times a day (QID) | ORAL | Status: DC | PRN
Start: 2017-04-21 — End: 2017-04-22

## 2017-04-21 MED ORDER — SODIUM CHLORIDE 0.9 % IJ SYRG
Freq: Three times a day (TID) | INTRAMUSCULAR | Status: DC
Start: 2017-04-21 — End: 2017-04-21

## 2017-04-21 MED ORDER — ONDANSETRON (PF) 4 MG/2 ML INJECTION
4 mg/2 mL | INTRAMUSCULAR | Status: DC | PRN
Start: 2017-04-21 — End: 2017-04-22

## 2017-04-21 MED ORDER — HYDROMORPHONE (PF) 1 MG/ML IJ SOLN
1 mg/mL | INTRAMUSCULAR | Status: AC
Start: 2017-04-21 — End: 2017-04-21
  Administered 2017-04-21: 14:00:00 via INTRAVENOUS

## 2017-04-21 MED ORDER — NITROGLYCERIN 2 % TRANSDERMAL OINTMENT
2 % | TRANSDERMAL | Status: AC
Start: 2017-04-21 — End: 2017-04-21
  Administered 2017-04-21: 14:00:00 via TOPICAL

## 2017-04-21 MED ORDER — HYDROMORPHONE 2 MG/ML INJECTION SOLUTION
2 mg/mL | Freq: Once | INTRAMUSCULAR | Status: AC
Start: 2017-04-21 — End: 2017-04-21
  Administered 2017-04-21: 19:00:00 via INTRAVENOUS

## 2017-04-21 MED ORDER — AMLODIPINE 5 MG TAB
5 mg | ORAL | Status: DC
Start: 2017-04-21 — End: 2017-04-21

## 2017-04-21 MED ORDER — ONDANSETRON (PF) 4 MG/2 ML INJECTION
4 mg/2 mL | INTRAMUSCULAR | Status: AC
Start: 2017-04-21 — End: 2017-04-21
  Administered 2017-04-21: 15:00:00 via INTRAVENOUS

## 2017-04-21 MED ORDER — HYDROMORPHONE (PF) 1 MG/ML IJ SOLN
1 mg/mL | INTRAMUSCULAR | Status: AC
Start: 2017-04-21 — End: 2017-04-21
  Administered 2017-04-21: 17:00:00 via INTRAVENOUS

## 2017-04-21 MED ORDER — NITROGLYCERIN 2 % TRANSDERMAL OINTMENT
2 % | Freq: Once | TRANSDERMAL | Status: DC
Start: 2017-04-21 — End: 2017-04-22

## 2017-04-21 MED ORDER — HYDROMORPHONE (PF) 1 MG/ML IJ SOLN
1 mg/mL | INTRAMUSCULAR | Status: AC
Start: 2017-04-21 — End: 2017-04-21
  Administered 2017-04-21: 15:00:00 via INTRAVENOUS

## 2017-04-21 MED ORDER — NITROGLYCERIN 0.4 MG SUBLINGUAL TAB
0.4 mg | SUBLINGUAL | Status: DC | PRN
Start: 2017-04-21 — End: 2017-04-21

## 2017-04-21 MED ORDER — CLONIDINE 0.2 MG/24 HR WEEKLY TRANSDERM PATCH
0.2 mg/24 hr | TRANSDERMAL | Status: DC
Start: 2017-04-21 — End: 2017-04-22

## 2017-04-21 MED ORDER — ONDANSETRON (PF) 4 MG/2 ML INJECTION
4 mg/2 mL | INTRAMUSCULAR | Status: AC
Start: 2017-04-21 — End: 2017-04-21
  Administered 2017-04-21: 18:00:00 via INTRAVENOUS

## 2017-04-21 MED ORDER — NITROGLYCERIN 0.4 MG SUBLINGUAL TAB
0.4 mg | SUBLINGUAL | Status: DC
Start: 2017-04-21 — End: 2017-04-21
  Administered 2017-04-21: 18:00:00

## 2017-04-21 MED ORDER — ONDANSETRON (PF) 4 MG/2 ML INJECTION
4 mg/2 mL | INTRAMUSCULAR | Status: AC
Start: 2017-04-21 — End: 2017-04-21
  Administered 2017-04-21: 14:00:00 via INTRAVENOUS

## 2017-04-21 MED FILL — HYDROMORPHONE (PF) 1 MG/ML IJ SOLN: 1 mg/mL | INTRAMUSCULAR | Qty: 1

## 2017-04-21 MED FILL — NITRO-BID 2 % TRANSDERMAL OINTMENT: 2 % | TRANSDERMAL | Qty: 1

## 2017-04-21 MED FILL — HYDROMORPHONE 2 MG/ML INJECTION SOLUTION: 2 mg/mL | INTRAMUSCULAR | Qty: 1

## 2017-04-21 MED FILL — NITROGLYCERIN 0.4 MG SUBLINGUAL TAB: 0.4 mg | SUBLINGUAL | Qty: 1

## 2017-04-21 MED FILL — CLONIDINE 0.2 MG/24 HR WEEKLY TRANSDERM PATCH: 0.2 mg/24 hr | TRANSDERMAL | Qty: 1

## 2017-04-21 MED FILL — AMLODIPINE 5 MG TAB: 5 mg | ORAL | Qty: 2

## 2017-04-21 MED FILL — ONDANSETRON (PF) 4 MG/2 ML INJECTION: 4 mg/2 mL | INTRAMUSCULAR | Qty: 2

## 2017-04-21 MED FILL — VANCOMYCIN 50 MG/ML ORAL SOLUTION: 50 mg/mL | ORAL | Qty: 10

## 2017-04-21 MED FILL — METOPROLOL TARTRATE 5 MG/5 ML IV SOLN: 5 mg/ mL | INTRAVENOUS | Qty: 5

## 2017-04-21 MED FILL — ACETAMINOPHEN 325 MG TABLET: 325 mg | ORAL | Qty: 2

## 2017-04-21 NOTE — Consults (Addendum)
[place]    [facility]   [address] [place] [address]        GASTROENTEROLOGY CONSULTATION [place]us Richer, [place] office  [phone] NP in-hospital cell phone M-F until 4:30  After 5pm or on weekends, please call operator for physician on call        NAME:  David [name]   DOB:   12/01/1969   MRN:   [id number]       Referring Physician: Rebeca Allegra     Consult Date: 04/21/2017 3:17 PM    Chief Complaint: diarrhea     History of Present Illness:  Patient is a 47 y.o. who is seen in consultation at the request of Dr. Christie Beckers for diarrhea. Multiple medical problems listed below. Patient reports having c diff infections 15 times over the past year - failed treatment with oral vancomycin/flagyl and dificid. He reports last completed vanc/flagyl 2 weeks ago. He has been having nausea, vomiting, diarrhea, with BRBPR since Sunday. He reports recurrent ascites of unknown origin. He reports a liver biopsy that did not show cause. He reports getting paracentesis every 2 weeks over the past year.     He reports colonoscopy ~2010 with hemorrhoids (had surgery), polyps  He's never had an EGD. He denies alcohol. He uses some BC powder. He smokes a few cigarettes per day.    I have reviewed the emergency room note, hospital admission note, notes by all other clinicians who have seen the patient during this hospitalization to date. I have reviewed the problem list and the reason for this hospitalization. I have reviewed the allergies and the medications the patient was taking at home prior to this hospitalization.    PMH:  Past Medical History:   Diagnosis Date   ??? Chronic kidney disease    ??? HTN (hypertension)    ??? MI (myocardial infarction) (HCC) 2010       PSH:  Past Surgical History:   Procedure Laterality Date   ??? ABDOMEN SURGERY PROC UNLISTED      umbilical hernia and right groin hernia 2016   ??? HX CORONARY STENT PLACEMENT  2010       Allergies:  Allergies   Allergen Reactions    ??? Lisinopril Shortness of Breath and Angioedema   ??? Morphine Shortness of Breath and Angioedema   ??? Toradol [Ketorolac] Shortness of Breath and Angioedema   ??? Tramadol Shortness of Breath       Home Medications:  Prior to Admission Medications   Prescriptions Last Dose Informant Patient Reported? Taking?   albuterol (PROVENTIL HFA, VENTOLIN HFA, PROAIR HFA) 90 mcg/actuation inhaler 04/21/2017 at Unknown time  Yes Yes   Sig: Take  by inhalation.   amLODIPine (NORVASC) 10 mg tablet 04/17/2017  Yes No   Sig: Take 10 mg by mouth daily.   carvedilol (COREG) 12.5 mg tablet 04/17/2017  Yes No   Sig: Take 12.5 mg by mouth two (2) times daily (with meals).   furosemide (LASIX) 80 mg tablet 04/17/2017  Yes Yes   Sig: Take 80 mg by mouth daily.   hydrALAZINE (APRESOLINE) 100 mg tablet 04/17/2017  Yes No   Sig: Take 100 mg by mouth three (3) times daily.   sevelamer (RENAGEL) 800 mg tablet 04/17/2017  Yes No   Sig: Take  by mouth three (3) times daily (with meals).      Facility-Administered Medications: None       Hospital Medications:  Current Facility-Administered Medications   Medication Dose  Route Frequency   ??? metoprolol (LOPRESSOR) injection 5 mg  5 mg IntraVENous Q6H   ??? cloNIDine (CATAPRES) 0.2 mg/24 hr patch 1 Patch  1 Patch TransDERmal Q7D   ??? nitroglycerin (NITROBID) 2 % ointment 0.5 Inch  0.5 Inch Topical ONCE     Current Outpatient Medications   Medication Sig   ??? albuterol (PROVENTIL HFA, VENTOLIN HFA, PROAIR HFA) 90 mcg/actuation inhaler Take  by inhalation.   ??? furosemide (LASIX) 80 mg tablet Take 80 mg by mouth daily.   ??? carvedilol (COREG) 12.5 mg tablet Take 12.5 mg by mouth two (2) times daily (with meals).   ??? hydrALAZINE (APRESOLINE) 100 mg tablet Take 100 mg by mouth three (3) times daily.   ??? sevelamer (RENAGEL) 800 mg tablet Take  by mouth three (3) times daily (with meals).   ??? amLODIPine (NORVASC) 10 mg tablet Take 10 mg by mouth daily.       Social History:  Social History     Tobacco Use    ??? Smoking status: Never Smoker   ??? Smokeless tobacco: Never Used   Substance Use Topics   ??? Alcohol use: No     Frequency: Never       Family History:  History reviewed. No pertinent family history.    Review of Systems:    Constitutional: +fever, negative chills, +weight loss  Eyes:   negative visual changes  ENT:   negative sore throat, tongue or lip swelling  Respiratory:  negative cough, +dyspnea  Cards:  +for chest pain and lower extremity edema, negative palpitations   GI:   See HPI  GU:  negative for frequency, dysuria  Integument:  negative for rash and pruritus  Heme:  negative for easy bruising and gum/nose bleeding  Musculoskeletal: negative for myalgias, back pain and muscle weakness  Neuro:             negative for headaches, dizziness, vertigo  Psych:  negative for feelings of anxiety, depression     Objective:     Patient Vitals for the past 8 hrs:   BP Temp Pulse Resp SpO2 Height Weight   04/21/17 1441 ??? 97.9 ??F (36.6 ??C) 95 18 99 % ??? ???   04/21/17 1300 165/90 ??? ??? ??? 99 % ??? ???   04/21/17 1230 (!) 163/96 ??? ??? ??? ??? ??? ???   04/21/17 1226 ??? ??? ??? ??? 100 % ??? ???   04/21/17 1224 ??? ??? ??? ??? ??? 6' (1.829 m) 111.1 kg (245 lb)   04/21/17 1220 (!) 165/99 98.6 ??F (37 ??C) 95 16 100 % ??? ???     No intake/output data recorded.  No intake/output data recorded.    EXAM:     CONST:  Pleasant male sitting on edge of bed, no acute distress   NEURO:  alert and oriented x 3   HEENT: EOMI, no scleral icterus   LUNGS: clear to ausculation    CARD:  regular rate,S1 S2   ABD:  +ascites, generalized tenderness, no rebound, bowel sounds (+) all 4 quadrants, no masses, distended   EXT:  +edema, warm   PSYCH: full, not anxious     Data Review     Recent Labs     04/21/17  0854   WBC 7.2   HGB 9.7*   HCT 30.5*   PLT 128*     Recent Labs     04/21/17  0854   NA 141   K 4.4  CL 104   CO2 26   BUN 40*   CREA 6.81*   GLU 91   CA 6.9*     No results for input(s): SGOT, GPT, AP, TBIL, TP, ALB, GLOB, GGT, AML, LPSE in the last 72 hours.     No lab exists for component: AMYP, HLPSE  No results for input(s): INR, PTP, APTT in the last 72 hours.    No lab exists for component: INREXT  Assessment:   ?? Diarrhea/nausea/vomiting: likely c diff - history 15x since December 2017  ?? Recurrent ascites: unclear cause; platelets 128  ?? ESRD: on HD  ?? Chest pain and shortness of breath     Patient Active Problem List   Diagnosis Code   ??? Chest pain R07.9     Plan:   ?? Oral vanc started, try antiemetics if difficulty tolerating PO  ?? Stool studies pending  ?? Monitor CBC  ?? Therapeutic paracentesis   ?? Plan for colonoscopy with fecal transplant Monday at 3:30pm   ?? Dr. Christy Gentles discussed with Dr. Larose Kells   ?? Thank you for allowing me to participate in care of David [name]     Signed By: Lafayette Dragon, NP     04/21/2017  3:17 PM     Patient seen and examined, agree with plans, very complication. He has had c. Diff. At least 10 times in the past year. I feel certain he has it again. I have arranged for Dr. Patrecia Pace to do colonoscopy and fecal transplanatation on Monday afternoon. I would keep him on oral vancomycin until it can be done. In addition he has refractory ascites. Workup in [place] including liver biopsy has been negative. He has required paracentesis every 2 weeks to maintain comfort. I talked with Dr. Larose Kells and he has agreed to see and evaluate. Thanks, will follow.    David Deutscher, MD

## 2017-04-21 NOTE — ED Provider Notes (Addendum)
47 y.o. male with past medical history significant for HTN, CKD, and MI who presents from [facility] via EMS with chief complaint of N/V/D. Pt reports he has been having severe N/V/D w/ abdominal pain for 4 days. He states he has been having 7-8 episodes of vomiting per day and he the diarrhea has been continuous. Pt also c/o L sided chest pain, described as "pressure", which has been unchanged 10 hours w/ SOB. He states he was dx w/ C-diff last month and he finished taking Flagyl w/ Vancomycin ~2 weeks ago. Pt notes he is chronically on oxygen d/t asthma and he is due for dialysis tomorrow. In addition, Pt had an MI in 2010. He lives in [place] and is in [place] visiting family. There are no other acute medical concerns at this time.    Note written by Janece Canterbury, Scribe, as dictated by Lovey Newcomer, MD 12:32 PM             Past Medical History:   Diagnosis Date   ??? Chronic kidney disease    ??? HTN (hypertension)    ??? MI (myocardial infarction) (HCC) 2010       Past Surgical History:   Procedure Laterality Date   ??? ABDOMEN SURGERY PROC UNLISTED      umbilical hernia and right groin hernia 2016   ??? HX CORONARY STENT PLACEMENT  2010         History reviewed. No pertinent family history.    Social History     Socioeconomic History   ??? Marital status: SINGLE     Spouse name: Not on file   ??? Number of children: Not on file   ??? Years of education: Not on file   ??? Highest education level: Not on file   Social Needs   ??? Financial resource strain: Not on file   ??? Food insecurity - worry: Not on file   ??? Food insecurity - inability: Not on file   ??? Transportation needs - medical: Not on file   ??? Transportation needs - non-medical: Not on file   Occupational History   ??? Not on file   Tobacco Use   ??? Smoking status: Never Smoker   ??? Smokeless tobacco: Never Used   Substance and Sexual Activity   ??? Alcohol use: No     Frequency: Never   ??? Drug use: No   ??? Sexual activity: Not on file   Other Topics Concern   ??? Not on file    Social History Narrative   ??? Not on file         ALLERGIES: Lisinopril; Morphine; Toradol [ketorolac]; and Tramadol    Review of Systems   Constitutional: Negative for activity change and fatigue.   HENT: Negative for ear pain, facial swelling, sore throat and trouble swallowing.    Eyes: Negative for pain, discharge and visual disturbance.   Respiratory: Positive for shortness of breath. Negative for wheezing.    Cardiovascular: Positive for chest pain. Negative for palpitations.   Gastrointestinal: Positive for abdominal pain, diarrhea, nausea and vomiting.   Genitourinary: Negative for difficulty urinating, flank pain and hematuria.   Musculoskeletal: Negative for arthralgias, joint swelling, myalgias and neck pain.   Skin: Negative for color change and rash.   Neurological: Negative for dizziness, weakness, numbness and headaches.   Hematological: Negative for adenopathy. Does not bruise/bleed easily.   Psychiatric/Behavioral: Negative for behavioral problems, confusion and sleep disturbance.   All other systems reviewed and  are negative.      Vitals:    04/21/17 1220 04/21/17 1224 04/21/17 1226   BP: (!) 165/99     Pulse: 95     Resp: 16     Temp: 98.6 ??F (37 ??C)     SpO2: 100%  100%   Weight:  111.1 kg (245 lb)    Height:  6' (1.829 m)             Physical Exam   Constitutional: He is oriented to person, place, and time. He appears well-developed and well-nourished. No distress.   HENT:   Head: Normocephalic and atraumatic.   Nose: Nose normal.   Mouth/Throat: Oropharynx is clear and moist.   Eyes: Conjunctivae and EOM are normal. Pupils are equal, round, and reactive to light. No scleral icterus.   Neck: Normal range of motion. Neck supple. No JVD present. No tracheal deviation present. No thyromegaly present.   No carotid bruits noted.   Cardiovascular: Normal rate, regular rhythm, normal heart sounds and intact distal pulses. Exam reveals no gallop and no friction rub.   No murmur heard.   Pulmonary/Chest: Effort normal and breath sounds normal. No respiratory distress. He has no wheezes. He has no rales. He exhibits no tenderness.   Abdominal: Soft. Bowel sounds are normal. He exhibits no distension and no mass. There is no tenderness. There is no rebound and no guarding.   Musculoskeletal: Normal range of motion. He exhibits no edema or tenderness.   Lymphadenopathy:     He has no cervical adenopathy.   Neurological: He is alert and oriented to person, place, and time. He has normal reflexes. No cranial nerve deficit. Coordination normal.   Skin: Skin is warm and dry. No rash noted. No erythema.   Psychiatric: He has a normal mood and affect. His behavior is normal. Judgment and thought content normal.   Nursing note and vitals reviewed.  Note written by Janece CanterburyJay F Spangler, Scribe, as dictated by Lovey NewcomerPowell, Tykerria Mccubbins G, MD 12:32 PM    MDM  Number of Diagnoses or Management Options     Amount and/or Complexity of Data Reviewed  Clinical lab tests: reviewed  Tests in the radiology section of CPT??: reviewed  Decide to obtain previous medical records or to obtain history from someone other than the patient: yes  Review and summarize past medical records: yes    Risk of Complications, Morbidity, and/or Mortality  Presenting problems: moderate  Diagnostic procedures: moderate  Management options: moderate    Patient Progress  Patient progress: stable         Procedures    [facility], lightheadedness or noted, x-rays noted, and the patient will be admitted to the hospitalist service. He requires dialysis tomorrow. He is in no acute distress at the present time      ED EKG interpretation:  Rhythm: normal sinus rhythm; Rate (approx.): 100; Axis: left axis deviation; ST/T wave: no ischemic changes; no ectopy  Note written by Janece CanterburyJay F Spangler, Scribe, as dictated by Lovey NewcomerPowell, Ceylin Dreibelbis G, MD 12:35 PM         CONSULT NOTE:  1:20 PM Lovey NewcomerPowell, Damir Leung G, MD Tiger Texted Dr. Christie BeckersMathur, Consult for  Hospitalist.  Discussed available diagnostic tests and clinical findings.  Dr. Christie BeckersMathur will evaluate Pt for admisison.    Patient is stable in the ED.

## 2017-04-21 NOTE — Other (Signed)
Bedside shift change report given to Kyerra (oncoming nurse) by Jess (offgoing nurse). Report included the following information SBAR.

## 2017-04-21 NOTE — ED Provider Notes (Signed)
EMERGENCY DEPARTMENT HISTORY AND PHYSICAL EXAM      Date: 04/21/2017  Patient Name: David [name]    History of Presenting Illness     Chief Complaint   Patient presents with   ??? Diarrhea   ??? Abdominal Pain       History Provided By: Patient    HPI: David [name], 47 y.o. male with PMHx significant for HTN, presents ambulatory to the ED with cc of recurrent episodes of n/v/d x 5 days. Pt states he last vomited 30 minutes ago and last had an episode of watery, bloody diarrhea 10 minutes ago. While in the ED pt c/o diffuse abdominal pain and cramping. Pt states in November, 2018 he was treated for PNA and developed c. Diff the following month. He explains he finished the vancomycin and flagyl regimen 2 weeks ago and felt well, however now believes the c.diff infection to have returned. In addition, pt reports substernal chest pressure and SOB. He is a MWF dialysis pt and was last dialyzed yesterday. He denies any EtOH or tobacco use. Pt specifically denies any fevers or chills.     There are no other complaints, changes, or physical findings at this time.    PCP: None    No current facility-administered medications on file prior to encounter.      Current Outpatient Medications on File Prior to Encounter   Medication Sig Dispense Refill   ??? carvedilol (COREG) 12.5 mg tablet Take 12.5 mg by mouth two (2) times daily (with meals).     ??? hydrALAZINE (APRESOLINE) 100 mg tablet Take 100 mg by mouth three (3) times daily.     ??? sevelamer (RENAGEL) 800 mg tablet Take  by mouth three (3) times daily (with meals).     ??? amLODIPine (NORVASC) 10 mg tablet Take 10 mg by mouth daily.     ??? albuterol (PROVENTIL HFA, VENTOLIN HFA, PROAIR HFA) 90 mcg/actuation inhaler Take  by inhalation.     ??? Oxygen 3 Each continuous.         Past History     Past Medical History:  Past Medical History:   Diagnosis Date   ??? HTN (hypertension)        Past Surgical History:  No past surgical history on file.    Family History:  No family history on file.     Social History:  Social History     Tobacco Use   ??? Smoking status: Current Every Day Smoker     Packs/day: 0.25   ??? Smokeless tobacco: Never Used   Substance Use Topics   ??? Alcohol use: Yes   ??? Drug use: No       Allergies:  Allergies   Allergen Reactions   ??? Lisinopril Shortness of Breath   ??? Morphine Shortness of Breath   ??? Toradol [Ketorolac] Shortness of Breath   ??? Tramadol Shortness of Breath         Review of Systems   Review of Systems   Constitutional: Negative for fever.   HENT: Negative for sore throat and trouble swallowing.    Eyes: Negative for photophobia and redness.   Respiratory: Positive for shortness of breath. Negative for cough.    Cardiovascular: Positive for chest pain. Negative for leg swelling.   Gastrointestinal: Positive for abdominal pain, blood in stool, diarrhea, nausea and vomiting. Negative for constipation.   Endocrine: Negative for polydipsia and polyuria.   Genitourinary: Negative for dysuria, hematuria and scrotal swelling.   Musculoskeletal:  Negative for back pain and joint swelling.   Skin: Negative for rash.   Neurological: Negative for dizziness, syncope, weakness and headaches.   Psychiatric/Behavioral: Negative for suicidal ideas.   All other systems reviewed and are negative.      Physical Exam   Physical Exam   Constitutional: He is oriented to person, place, and time. He appears well-developed and well-nourished. No distress.   HENT:   Head: Normocephalic and atraumatic.   Mouth/Throat: Oropharynx is clear and moist. No oropharyngeal exudate.   Eyes: Conjunctivae and EOM are normal. Pupils are equal, round, and reactive to light. Left eye exhibits no discharge.   Neck: Normal range of motion. Neck supple. No JVD present.   Cardiovascular: Regular rhythm, normal heart sounds and intact distal pulses. Tachycardia present.   Pulmonary/Chest: Effort normal. No respiratory distress. He has no wheezes. He has rhonchi (b/l).    Abdominal: Soft. Bowel sounds are normal. He exhibits no distension. There is generalized tenderness (mild). There is no rebound and no guarding.   Musculoskeletal: Normal range of motion. He exhibits no tenderness.   1+ pitting edema BLE    Lymphadenopathy:     He has no cervical adenopathy.   Neurological: He is alert and oriented to person, place, and time. He has normal reflexes. No cranial nerve deficit.   Skin: Skin is warm and dry. No rash noted.   Psychiatric: He has a normal mood and affect. His behavior is normal.   Nursing note and vitals reviewed.      Diagnostic Study Results     Labs -     Recent Results (from the past 12 hour(s))   EKG, 12 LEAD, INITIAL    Collection Time: 04/21/17  8:33 AM   Result Value Ref Range    Ventricular Rate 100 BPM    Atrial Rate 100 BPM    P-R Interval 178 ms    QRS Duration 84 ms    Q-T Interval 372 ms    QTC Calculation (Bezet) 479 ms    Calculated P Axis 66 degrees    Calculated R Axis -20 degrees    Calculated T Axis 79 degrees    Diagnosis       Normal sinus rhythm  Possible Left atrial enlargement  Borderline ECG  No previous ECGs available     CBC WITH AUTOMATED DIFF    Collection Time: 04/21/17  8:54 AM   Result Value Ref Range    WBC 7.2 4.1 - 11.1 K/uL    RBC 3.11 (L) 4.10 - 5.70 M/uL    HGB 9.7 (L) 12.1 - 17.0 g/dL    HCT 16.1 (L) 09.6 - 50.3 %    MCV 98.1 80.0 - 99.0 FL    MCH 31.2 26.0 - 34.0 PG    MCHC 31.8 30.0 - 36.5 g/dL    RDW 04.5 (H) 40.9 - 14.5 %    PLATELET 128 (L) 150 - 400 K/uL    MPV 9.6 8.9 - 12.9 FL    NRBC 0.0 0 PER 100 WBC    ABSOLUTE NRBC 0.00 0.00 - 0.01 K/uL    NEUTROPHILS 85 (H) 32 - 75 %    LYMPHOCYTES 4 (L) 12 - 49 %    MONOCYTES 7 5 - 13 %    EOSINOPHILS 4 0 - 7 %    BASOPHILS 0 0 - 1 %    IMMATURE GRANULOCYTES 0 0.0 - 0.5 %    ABS. NEUTROPHILS 6.1 1.8 -  8.0 K/UL    ABS. LYMPHOCYTES 0.3 (L) 0.8 - 3.5 K/UL    ABS. MONOCYTES 0.5 0.0 - 1.0 K/UL    ABS. EOSINOPHILS 0.3 0.0 - 0.4 K/UL    ABS. BASOPHILS 0.0 0.0 - 0.1 K/UL     ABS. IMM. GRANS. 0.0 0.00 - 0.04 K/UL    DF SMEAR SCANNED      RBC COMMENTS NORMOCYTIC, NORMOCHROMIC     METABOLIC PANEL, BASIC    Collection Time: 04/21/17  8:54 AM   Result Value Ref Range    Sodium 141 136 - 145 mmol/L    Potassium 4.4 3.5 - 5.1 mmol/L    Chloride 104 97 - 108 mmol/L    CO2 26 21 - 32 mmol/L    Anion gap 11 5 - 15 mmol/L    Glucose 91 65 - 100 mg/dL    BUN 40 (H) 6 - 20 MG/DL    Creatinine 1.61 (H) 0.70 - 1.30 MG/DL    BUN/Creatinine ratio 6 (L) 12 - 20      GFR est AA 11 (L) >60 ml/min/1.53m2    GFR est non-AA 9 (L) >60 ml/min/1.64m2    Calcium 6.9 (L) 8.5 - 10.1 MG/DL   TROPONIN I    Collection Time: 04/21/17  8:54 AM   Result Value Ref Range    Troponin-I, Qt. 0.10 (H) <0.05 ng/mL   NT-PRO BNP    Collection Time: 04/21/17  8:54 AM   Result Value Ref Range    NT pro-BNP >35,000 (H) 0 - 125 PG/ML   OCCULT BLOOD, STOOL    Collection Time: 04/21/17  9:31 AM   Result Value Ref Range    Occult blood, stool NEGATIVE  NEG         Radiologic Studies -   CXR Results  (Last 48 hours)               04/21/17 0847  XR CHEST PORT Final result    Impression:  IMPRESSION:       Moderate cardiac silhouette enlargement. Mild diffuse interstitial opacities   suggesting pulmonary edema.           Narrative:  EXAM:  XR CHEST PORT       INDICATION: Chest pain       COMPARISON: None       TECHNIQUE: Portable AP upright chest view at 0837 hours       FINDINGS: There is a left IJ catheter terminating in profile with the right   atrium. A right vascular stent is noted. There is moderate cardiac silhouette   enlargement.       There are mild diffuse interstitial opacities. There is no pleural effusion or   pneumothorax. The bones and upper abdomen are and unremarkable.               Medical Decision Making   I am the first provider for this patient.    I reviewed the vital signs, available nursing notes, past medical history, past surgical history, family history and social history.     Vital Signs-Reviewed the patient's vital signs.  Patient Vitals for the past 12 hrs:   Temp Pulse Resp BP SpO2   04/21/17 0939 ??? 100 18 (!) 178/95 98 %   04/21/17 0825 98.4 ??F (36.9 ??C) (!) 109 18 (!) 194/126 98 %       Pulse Oximetry Analysis - 98% on RA    Cardiac Monitor:   Rate: 109 bpm  Rhythm: Sinus Tachycardia      Records Reviewed: Nursing Notes and Old Medical Records    Provider Notes (Medical Decision Making):   DDx: gastroenteritis, recurrent clostridium difficile colitis, fluid overload, chronic renal failure, atypical chest pain, recurrent PNA, uncontrolled HTN    ED Course:   Initial assessment performed. The patients presenting problems have been discussed, and they are in agreement with the care plan formulated and outlined with them.  I have encouraged them to ask questions as they arise throughout their visit.    CONSULT NOTE:   10:13 AM  Alfonse SprucePedro J Gayna Braddy, MD spoke with Dr. Lowell GuitarPowell   Specialty: ED  Discussed pt's hx, disposition, and available diagnostic and imaging results. Reviewed care plans. Consultant agrees with plans as outlined.  Dr. Lowell GuitarPowell [facility].   Written by Terri Skainsasey Judson, ED Scribe, as dictated by Alfonse SprucePedro J Angelina Venard, MD.     CRITICAL CARE NOTE :    10:13 AM    IMPENDING DETERIORATION -Cardiovascular, Metabolic and Renal  ASSOCIATED RISK FACTORS - Dysrhythmia, Metabolic changes and Dehydration  MANAGEMENT- Bedside Assessment, Supervision of Care and Transfer  INTERPRETATION -  Xrays, ECG and Blood Pressure  INTERVENTIONS - hemodynamic mngmt, vascular control and Metobolic interventions  CASE REVIEW - Medical Sub-Specialist and Nursing  TREATMENT RESPONSE -Stable  PERFORMED BY - Self    NOTES   :    I have spent 45 minutes of critical care time involved in lab review, consultations with specialist, family decision- making, bedside attention and documentation. During this entire length of time I was immediately available to the patient .    Alfonse SprucePedro J Zannah Melucci, MD     Disposition:  Transfer Note:  10:14 AM  [facility] transfer accepted by Dr. Lowell GuitarPowell.  The reasons for patient's transfer have been discussed with the patient and available family.  They convey agreement and understanding for the need to be transferred as explained to them by Alfonse SprucePedro J Arilynn Blakeney, MD     PLAN:  1. [facility]     Diagnosis     Clinical Impression:   1. Acute colitis    2. Hypervolemia, unspecified hypervolemia type    3. Essential hypertension        Attestations:    This note is prepared by Terri Skainsasey Judson, acting as Scribe for Alfonse SprucePedro J Lindy Pennisi, MD.    Alfonse SprucePedro J Desarie Feild, MD: The scribe's documentation has been prepared under my direction and personally reviewed by me in its entirety. I confirm that the note above accurately reflects all work, treatment, procedures, and medical decision making performed by me.

## 2017-04-21 NOTE — Progress Notes (Signed)
TTE Completed Results to follow

## 2017-04-21 NOTE — ED Triage Notes (Signed)
Pt presents from EMS [facility] a [facility]. Pt arrived there this morning with chest pain, n/v/diarrhea x 5 days. Pt also a dialysis pt mwf

## 2017-04-21 NOTE — ED Triage Notes (Signed)
C/o midepigastric pain with n/v/diarrhea x 5 days, recently had cdiff and pneumonia and finished ABX 2 weeks ago; dialysis MWF

## 2017-04-21 NOTE — Progress Notes (Addendum)
Patient requested Dilaudid, I paged Dr. Christie BeckersMathur with his request, he stated that patient did not need Dilaudid. I communicated this to patient and he immediately asked [facility]. Nursing supervisor made aware. I told patient that I paged Dr. Christie BeckersMathur to come speak with him and he said "it doesn't matter, he doesn't need to come up here, I'm leaving". Patient is competent, a&ox4, signed his own AMA paperwork. I explained that because patient wants to leave he is refusing all medical care and treatment. Patient then went on to ask for voucher for a driving service and wheelchair, I explained that we were unable to provide this things. Patient signed paperwork at 2100 and left the floor at 2155    Dr. Christie BeckersMathur called back after patient had already left, so he was unable to speak with patient and didn't sign the [facility] paper, RN signed and filed in chart.

## 2017-04-21 NOTE — Other (Signed)
Primary Nurse Jessica E Hunt and Janie, RN performed a dual skin assessment on this patient No impairment noted  Braden score is 23

## 2017-04-21 NOTE — Progress Notes (Signed)
Was informed by the RN that patient left "AMA " and just walked out  I had informed RN that Pt does not need diluadid at this time and when she communicated the same to Patient " he just signed AMA paperwork and walked out"  The Rn tells me that she told the patient to wait for my arrival to discuss care plan but he refused to stay, all risks of leaving AMA were explained to him by the RN, By the time I called back patient had left AMA already  Per RN patient was in sound mental state with astute mental abilities

## 2017-04-21 NOTE — Other (Signed)
TRANSFER - OUT REPORT:    Verbal report given to Theresa(name) on Audley Hoserey Lehner  being transferred to 3N(unit) for routine progression of care       Report consisted of patient???s Situation, Background, Assessment and   Recommendations(SBAR).     Information from the following report(s) SBAR was reviewed with the receiving nurse.    Lines:       Opportunity for questions and clarification was provided.      Patient transported with:   [employer]

## 2017-04-21 NOTE — ED Notes (Signed)
Pt care [facility]

## 2017-04-21 NOTE — H&P (Signed)
[name]  HISTORY AND PHYSICAL      Name:[name], David  MR#: [id number]  DOB: 12/01/1969  ACCOUNT #: [id number]   ADMIT DATE: 04/21/2017    CHIEF COMPLAINT:  Chest pain.    HISTORY OF PRESENT ILLNESS:  The patient is a 47 year old gentleman with past medical history of on hemodialysis, hypertension, MI who presents to the hospital with the above mentioned symptoms.  The patient reports that he has been having some nausea and vomiting associated with diarrhea that has been going on for 3-4 days.  The patient reports that he was recently diagnosed with pneumonia about a month back and then developed C. diff.  The patient reports that he took Flagyl and vancomycin for his C. diff and completed the course about 2 weeks back but feels that it has come back.  The patient reports that he lives in [place] and that is where he got his treatment done and they were thinking about doing a fecal transplant because of his symptoms.  Patient reports his stools initially turned semi-solid but now they are watery again.  The patient reports he has had multiple episodes of diarrhea associated with abdominal pain and distention that has been going on for the past 2-3 days.   chest pain that started yesterday.  Reports that the pain is situated in the middle of the chest and radiates to his jaw.  Patient reports that he was at dialysis yesterday and they took off 3 liters.  Reports that he is on chronic oxygen because of his last inpatient  MI in 2010.  Patient was seen in the hospital and was requested to be admitted under the hospitalist service.      The patient denies any headache, blurry vision, sore throat, trouble swallowing, trouble with speech, any fever, chills, lightheadedness, dizziness, any urinary symptoms, focal or generalized neurological weakness, recent travel, sick contacts, falls, injuries, hematemesis, melena, hemoptysis.  The patient reports that he does not have an  established nephrologist in [place].    PAST MEDICAL HISTORY:  See above.    HOME MEDICATIONS:  Coreg 12.5 mg b.i.d., hydralazine 100 mg t.i.d., Renagel 800 mg t.i.d., amlodipine 10 mg daily and albuterol p.r.n.    SOCIAL HISTORY:  Denies tobacco use, alcohol use or IV drug abuse.  Lives at home.    ALLERGIES:  LISINOPRIL, MORPHINE, TORADOL AND TRAMADOL.    REVIEW OF SYSTEMS:  All systems were reviewed and found to be essentially negative except for the symptoms mentioned above.    FAMILY HISTORY:  Discussed, was found to be noncontributory.    PHYSICAL EXAMINATION:  VITAL SIGNS:  Temperature 98.6, pulse 75, respiratory rate  16, blood pressure ,  pulse ox 99% on 4 liters.  GENERAL:  Alert and oriented x3, awake, mildly distressed, pleasant male, appears to be stated  age.   HEENT:  Pupils equal, reactive to light. Dry mucous membranes. Tympanic membranes clear.    NECK:  Supple.  CHEST:  Decreased distant breath sounds with scattered crackles.  CARDIOVASCULAR:  Tachycardic.  ABDOMEN:  Soft, mildly distended.  Bowel sounds were hypoactive.  Tender to palpation .     EXTREMITIES:  No clubbing, no cyanosis, 1+ pitting edema.  NEUROPSYCHIATRIC:  Pleasant mood and affect.  Cranial nerves II-XII grossly intact.  Sensory grossly within normal limits.  DTRs 2+/4.  Strength 5/5.      LABORATORY DATA:  White count ,  hemoglobin .7, hematocrit 30.5, platelets 128.  Sodium 141,  potassium 3.1, chloride 104, bicarbonate 26, anion gap 11, BUN , creatinine 6.81, calcium 6.9, troponin 0.10.  Stool occult blood is negative.  CT of the abdomen and pelvis shows large volume ascites of uncertain etiology, concern for distal small bowel thickening although the examination is limited by lack of oral and intravenous contrast and large volume ascites.  X-ray of the chest shows moderate cardiac silhouette enlargement  mildly diffuse interstitial opacity suggesting pulmonary edema.    ASSESSMENT AND PLAN:   1.  Abdominal pain with nausea, vomiting and diarrhea, unclear etiology, gastroenteritis versus Clostridium difficile versus other etiology:  Patient is admitted on a telemetry bed.   We will start patient on Flagyl and vancomycin orally empirically.  We will get stool for C. diff, supportive care, clear liquid diet, pain control, send stool for studies and get GI consult and further intervention per hospital course.  Continue to monitor.  If symptoms persist, may consider further intervention and diagnostics. Will reassess as needed.  Patient also will be on IV antiemetics.   2.  Chest pain, rule out acute coronary syndrome:  Appreciate Cardiology input, echocardiogram, cycle troponins, aspirin, lipid panel,  . Will also provide nitroglycerin paste.  Further intervention per hospital course.  If symptoms persist, may consider further intervention with diagnostics and reassess as needed.  3.  Volume overload:  The patient appears to be volume overloaded, may need emergent dialysis.  Nephrology consult has been obtained.  Provide supportive care, oxygen support, will provide some  and continue to closely monitor.  Further intervention per hospital course.  Reassess as needed.  4.  Hypertension, poorly controlled:  The patient is in hypertensive urgency.  Will provide nitropaste. Continue home medications and continue to monitor.  If symptoms persist, may consider adding p.r.n. IV medications, should improve with dialysis.  Reassess as needed.  5.  Anemia, appears to be chronic secondary to renal disease.  Continue to monitor H and H.   6.  Hypocalcemia:  No albumin available.  I suspect that this is secondary to hypoalbuminemia.  We will get ionized calcium levels, continue to monitor, further intervention per hospital course.  EKG does not show any acute changes any acute ST elevation.  7.  Deep vein thrombosis prophylaxis:  The patient will be on SCDs.      Rebeca Allegra, MD       MM/SN  D: 04/21/2017 14:38      T: 04/21/2017 19:45  JOB #: [id number]

## 2017-04-21 NOTE — Progress Notes (Signed)
Pt refusing all meds until he is prescribed "something better than tylenol"

## 2017-04-21 NOTE — ED Notes (Signed)
Pt reports he was hospitalized one month ago in [place] with clostridium difficile. Pt reports return of symptoms 5 days ago with severe abdominal pain. Pt has been on Dialysis x 7 years, he is anuric. Pt is a good historian, medication list is updated, pt is on 3 liters of home oxygen continuously, his tank is in his vehicle. Pt smokes a couple of cigarettes a day, attempting to quit.

## 2017-04-21 NOTE — Progress Notes (Signed)
Admission Medication Reconciliation:    Information obtained from:  [facility]    Comments/Recommendations: Updated PTA meds/reviewed patient's allergies.    1)  Updated allergies: experiences angioedema with lisinopril, morphine, and Toradol    2) Patient was semi-reliable source. [facility] but no record of patient. No patient identification card available to confirm. Denies alcohol and illicit substance use. Smokes 3 cigarettes per day but is trying to quit.    3)  Medication changes (since last review):  Added  -Furosemide 80 mg once daily     Allergies:  Lisinopril; Morphine; Toradol [ketorolac]; and Tramadol    Significant PMH/Disease States:   Past Medical History:   Diagnosis Date   ??? Chronic kidney disease    ??? HTN (hypertension)    ??? MI (myocardial infarction) [facility](HCC) 2010       Chief Complaint for this Admission:    Chief Complaint   Patient presents with   ??? Diarrhea       Prior to Admission Medications:   Prior to Admission Medications   Prescriptions Last Dose Informant Patient Reported? Taking?   albuterol (PROVENTIL HFA, VENTOLIN HFA, PROAIR HFA) 90 mcg/actuation inhaler 04/21/2017 at Unknown time  Yes Yes   Sig: Take  by inhalation.   amLODIPine (NORVASC) 10 mg tablet 04/17/2017  Yes No   Sig: Take 10 mg by mouth daily.   carvedilol (COREG) 12.5 mg tablet 04/17/2017  Yes No   Sig: Take 12.5 mg by mouth two (2) times daily (with meals).   furosemide (LASIX) 80 mg tablet 04/17/2017  Yes Yes   Sig: Take 80 mg by mouth daily.   hydrALAZINE (APRESOLINE) 100 mg tablet 04/17/2017  Yes No   Sig: Take 100 mg by mouth three (3) times daily.   sevelamer (RENAGEL) 800 mg tablet 04/17/2017  Yes No   Sig: Take  by mouth three (3) times daily (with meals).      Facility-Administered Medications: None

## 2017-04-21 NOTE — Consults (Addendum)
[place]: David [name]  [phone]  Requesting/referring provider: Dr. Velta Addison  Reason for Consult: chest pain    HPI: David [name], a 47 y.o. year-old who presents for evaluation of abdominal and chest pain. Transferred to [facility] from [facility] for continued care.  He presented to [facility] following 5 days of N/V/D.  He reports moving down to the [place] area yesterday, from [place], to live with his sister.  Up until now, he received all his medical care in [place].  Treated for PNA in November of this year, in [place] hospital, and ultimately developing Cdiff.  He completed treatment 2 weeks ago.  He has ESRD and is on HD MWF via left IJ tunneled catheter.  He last dialyzed yesterday in [place], before traveling to [place], [place].  He does have a H/o CAD, s/p stents x 2 (RCA) in 2010, HTN, HLD, asthma and OSA.  He endorses chest pain over his left chest, more pressure like and states "it goes to my left jaw and left arm".  Troponin 0.01, EKG with NSR and no evidence for ACS.  Initial BP on arrival to [facility] was 194/126.  He has not been able to take his meds 2/2 vomiting. He also states he has been unable to eat and drink 2/2 N/V.    Subjective: C/o abdominal pain, left chest pressure with radiation to jaw and left arm.  Denies SOB or palpitations.    Pt needs evaluation for chf with echo, trend troponin for chest pain. He is somnolent, flals asleep during he interview. Denies pain at this time. Laying flat on his right side.     Assessment/Plan:    1. Chest pain - typical features   - EKG with NSR   - Troponin 0.10, trending q 6 hours   - Echo ordered   - Ischemic work up on hold, until resolution of acute issues.  2. Volume overload   - proBNP 35000   - pitting edema of legs   - distended abdomen  3. CAD   - MI in 2010   - s/p stents x 2, (RCA distribution on CT) in 2010   - Coreg, PTA   - Has not followed up with Cardiologist since stent placement   - Lopressor IV - until able to reliably take PO  4. N/V/D   - C.diff cx sent    - Negative occult stool   - Zofran IV prn   - Hospitalist consulted for admission  5. HTN   - Coreg, Hydralazine and Norvasc PTA - unable to take over last 5 days 2/2 vomiting   - Lopressor IV and Catapres patch  6. HLD   - not on medication currently - start when able to take PO   - Lipid panel ordered  7. ESRD   - HD MWF   - left IJ tunneled catheter   - Will need Nephrology consult   - He states his dry weight is 104 kg  8. OSA   - Witnessed.  Patient confirms completing sleep study, but has not received CPAP  9. Asthma   - HFA rescue inhaler only  10. Tobacco abuse   - daily smoker - discussed quitting, briefly  11. Body mass index is 33.23 kg/m??.   - obese.  Will need diet and exercise teaching.      He  has a past medical history of Chronic kidney disease, HTN (hypertension), and MI (myocardial infarction) (HCC).    Cardiovascular ROS: positive for -  chest pain and edema  negative for - irregular heartbeat, palpitations or shortness of breath  Respiratory ROS: no cough, shortness of breath, or wheezing  Neurological ROS: no TIA or stroke symptoms  All other systems negative except as above.     PE  Vitals:    04/21/17 1224 04/21/17 1226 04/21/17 1230 04/21/17 1300   BP:   (!) 163/96    Pulse:       Resp:       Temp:       SpO2:  100%  99%   Weight: 245 lb (111.1 kg)      Height: 6' (1.829 m)         Body mass index is 33.23 kg/m??.  General appearance - alert, well appearing, and in no distress  Mental status - affect appropriate to mood  Eyes - sclera anicteric, moist mucous membranes  Neck - supple, no significant adenopathy  Lymphatics - no  lymphadenopathy  Chest - clear to auscultation, no wheezes, rales or rhonchi  Heart - normal rate, regular rhythm, normal S1, S2, no murmurs, rubs, clicks or gallops  Abdomen - Firm, nontender, distended, no masses or organomegaly  Back exam - full range of motion, no tenderness  Neurological - cranial nerves II through XII grossly intact, no focal deficit   Musculoskeletal - no muscular tenderness noted, normal strength  Extremities - peripheral pulses normal, 2+ pitting edema  Skin - normal coloration  no rashes    Recent Labs:  No results found for: CHOL, CHOLX, CHLST, CHOLV, 884269, HDL, LDL, LDLC, DLDLP, TGLX, TRIGL, TRIGP, CHHD, CHHDX  Lab Results   Component Value Date/Time    Creatinine 6.81 (H) 04/21/2017 08:54 AM     Lab Results   Component Value Date/Time    BUN 40 (H) 04/21/2017 08:54 AM     Lab Results   Component Value Date/Time    Potassium 4.4 04/21/2017 08:54 AM     No results found for: HBA1C, HGBE8, HBA1CEXT  Lab Results   Component Value Date/Time    HGB 9.7 (L) 04/21/2017 08:54 AM     Lab Results   Component Value Date/Time    PLATELET 128 (L) 04/21/2017 08:54 AM       Reviewed:  Past Medical History:   Diagnosis Date   ??? Chronic kidney disease    ??? HTN (hypertension)    ??? MI (myocardial infarction) (HCC) 2010     Social History     Tobacco Use   Smoking Status Never Smoker   Smokeless Tobacco Never Used     Social History     Substance and Sexual Activity   Alcohol Use No   ??? Frequency: Never     Allergies   Allergen Reactions   ??? Lisinopril Shortness of Breath   ??? Morphine Shortness of Breath   ??? Toradol [Ketorolac] Shortness of Breath   ??? Tramadol Shortness of Breath       Current Facility-Administered Medications   Medication Dose Route Frequency   ??? nitroglycerin (NITROSTAT) tablet 0.4 mg  0.4 mg SubLINGual Q5MIN PRN   ??? nitroglycerin (NITROSTAT) 0.4 mg tablet       ??? ondansetron (ZOFRAN) 4 mg/2 mL injection       ??? HYDROmorphone (DILAUDID) injection 0.5 mg  0.5 mg IntraVENous ONCE   ??? metoprolol (LOPRESSOR) injection 5 mg  5 mg IntraVENous Q6H   ??? cloNIDine (CATAPRES) 0.2 mg/24 hr patch 1 Patch  1 Patch TransDERmal Q7D  Current Outpatient Medications   Medication Sig   ??? carvedilol (COREG) 12.5 mg tablet Take 12.5 mg by mouth two (2) times daily (with meals).   ??? hydrALAZINE (APRESOLINE) 100 mg tablet Take 100 mg by mouth three (3)  times daily.   ??? sevelamer (RENAGEL) 800 mg tablet Take  by mouth three (3) times daily (with meals).   ??? amLODIPine (NORVASC) 10 mg tablet Take 10 mg by mouth daily.   ??? albuterol (PROVENTIL HFA, VENTOLIN HFA, PROAIR HFA) 90 mcg/actuation inhaler Take  by inhalation.       Algis Downsyler R Howard, PA-C  [facility]  [address], [address]  [place], [place] [id number]

## 2017-04-21 NOTE — Consults (Addendum)
[place] [facility] [place]  [facility] [place] [facility]   [facility] [place] [facility]      Bryson HaMitchell L Lee-Anne Flicker, MD, FACP, [place]ACG, [place]       Emiliano DyerApril G Morris, NP    Jones BalesSarah Hubbard, PA-C    Zola ButtonAshley Foster, ACNP-BC   Mathis FarePhilip Alexander, NP    Charlton AmorKarla Hull, NP       [facility] [place]    at [facility]    [address], [address]    [place], [place]  [id number]    [phone]    FAX: [phone]   [facility] [place] [facility]    at [facility]    [address]    [place], [place]  [id number]    [phone]    FAX: [phone]         HEPATOLOGY CONSULT NOTE  I was asked to see this patient in consultation by Dr Christy GentlesSeeman for management of ascites.  I have reviewed the Emergency room note, Hospital admission note, Notes by all other physicians who have seen the patient during this hospitalization to date.  I have reviewed the problem list and the reason for this hospitalization.  I have reviewed the allergies and the medications the patient was taking at home prior to this hospitalization.      HISTORY:  The patient is a 47 year old Black male with ESRD secondary to HTN.  Has been on dialysis for over 10 years.  He is now being considered for a renal transplant with his sister being the donor.  He developed ascites for the first time about 1 year ago.  Ascites has been refractory and he has had paracentesis of 4-8 L every 2 weeks for the past 1 year.  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  There is mild pulmonary HTN with estimated PA pressure of 45 mm Hg and mild TR.  He had a liver biopsy in [place] which is reported to show a normal liver.    ASSESSMENT AND PLAN:  Ascites  A liver biopsy does not show cirrhosis.  This is almost certainly due to passive hepatic congestion from a combination of ESRD, biventicular CHF, pulmonary HTN and TR.  Will perform hepatic venous pressure measurements in AM.   Suspect the right sided pressures will all be elevated.    He is distended and it is time for him to have another paracentesis.  Will perform this a the same time as hepatic venous pressures in IR.    If he has passive hepatic congestion, it would be unlikely that he would be accepted as a candidate for renal transplant.    Anemia   This is due to ESRD on dialysis  Will obtain FE panel to assess for iron stores.    Thrombocytopenia   This is secondary to uncertain etiology.  No treatment is required.  The platelet count is adequate for the patient to undergo procedures without the need for platelet transfusion or platelet growth factors.    Chronic C Diff  He has repeated episodes of C Diff despite numerous courses of ABX with flagyl and Vanc.  The plan is to treat C Diff with a stool transplant.    SYSTEM REVIEW:  Constitution systems: Negative for fever, chills, weight gain, weight loss.   Eyes: Negative for visual changes.  ENT: Negative for sore throat, painful swallowing.   Respiratory: Negative for  cough, hemoptysis, SOB.   Cardiology: Negative for chest pain, palpitations.  GI:  Chronic diarrhea.  Abdominal swelling.  GU: Negative for urinary frequency, dysuria, hematuria, nocturia.   Skin: Negative for rash.  Hematology: Negative for easy bruising, blood clots.    Musculo-skelatal: Negative for back pain, muscle pain, weakness.  Neurologic: Negative for headaches, dizziness, vertigo, memory problems not related to HE.  Psychology: Negative for anxiety, depression.     FAMILY HISTORY:  The father is alive and healthy.    The mother has the following chronic diseases: HTN.    There is no family history of liver disease.      SOCIAL HISTORY:  The patient has never been married.    The patient has 2 children,   The patient currently smokes 3 cigarettes daily.    The patient has previously consumed alcohol in excess.  The patient has been abstinent from alcohol since 2009.     The patient used to work as a Production designer, theatre/television/film.    The patient does not currently work    PHYSICAL EXAMINATION:  VS: per nursing note  General:  No acute distress.   Eyes:  Sclera anicteric.   ENT:  No oral lesions.  Thyroid normal.  Nodes:  No adenopathy.   Skin:  No spider angiomata.  No jaundice.  Respiratory:  Lungs clear to auscultation.   Cardiovascular:  Regular heart rate.  Murmur present.  Positive JVD.  Abdomen:  Distended with obvious ascites.  Extremities:  No lower extremity edema.  No muscle wasting.  Neurologic:  Alert and oriented.  Cranial nerves grossly intact.  No asterixis.    LABORATORY:  Results for JOSPEH, MANGEL (MRN [id number]) as of 04/21/2017 19:15   Ref. Range 04/21/2017 08:54   WBC Latest Ref Range: 4.1 - 11.1 K/uL 7.2   HGB Latest Ref Range: 12.1 - 17.0 g/dL 9.7 (L)   PLATELET Latest Ref Range: 150 - 400 K/uL 128 (L)   Sodium Latest Ref Range: 136 - 145 mmol/L 141   Potassium Latest Ref Range: 3.5 - 5.1 mmol/L 4.4   Chloride Latest Ref Range: 97 - 108 mmol/L 104   CO2 Latest Ref Range: 21 - 32 mmol/L 26   Glucose Latest Ref Range: 65 - 100 mg/dL 91   BUN Latest Ref Range: 6 - 20 MG/DL 40 (H)   Creatinine Latest Ref Range: 0.70 - 1.30 MG/DL 4.09 (H)     11/1189.  ECHO Heart.  LV dilated.  EF 35-49%.  RV dilated.  Mild Pulmonary HTN with estimated PA pressure 45 mmHg.  Mild TR.      Bryson Ha, MD  [facility]  [address]  [address], [address]  [place], [place]  [address]  [phone]  [facility]

## 2017-04-21 NOTE — ED Notes (Signed)
Pt reports the hospital where he was admitted a month ago for C-Diff is [facility] in [place]. Pt reports history of miocardial infarction with 2 stents placed at the same hospital in 2010.

## 2017-04-21 NOTE — ED Notes (Signed)
Transport attempted to take patient to CT, patient requested a few minutes before going.

## 2017-04-21 NOTE — Consults (Signed)
[facility] [place]  [facility] [place] [facility]  [facility]      Bryson HaMitchell L Marquise Lambson, MD, FACP, FalknerFACG, [place]LD       Emiliano DyerApril G Morris, NP    Jones BalesSarah Hubbard, PA-C    Zola ButtonAshley Foster, ACNP-BC   Mathis FarePhilip Alexander, NP    Charlton AmorKarla Hull, NP       [facility] [place]    at [facility]    [address], [address]    [place], [place]  [id number]    [phone]    FAX: [phone]   [facility] [place]    at [facility]    [address]    [place], [place]  [id number]    [phone]    FAX: [phone]         HEPATOLOGY CONSULT NOTE  I was asked to see this patient in consultation by Dr Christy GentlesSeeman for management of ascites.  I have reviewed the Emergency room note, Hospital admission note, Notes by all other physicians who have seen the patient during this hospitalization to date.  I have reviewed the problem list and the reason for this hospitalization.  I have reviewed the allergies and the medications the patient was taking at home prior to this hospitalization.      HISTORY:  The patient is a 47 year old Black male with ESRD secondary to HTN.  Has been on dialysis for over 10 years.  He is now being considered for a renal transplant with his sister being the donor.  He developed ascites for the first time about 1 year ago.  Ascites has been refractory and he has had paracentesis of 4-8 L every 2 weeks for the past 1 year.  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  There is mild pulmonary HTN with estimated PA pressure of 45 mm Hg and mild TR.  He had a liver biopsy in [place] which is reported to show a normal liver.    ASSESSMENT AND PLAN:  Ascites  A liver biopsy does not show cirrhosis.  This is almost certainly due to passive hepatic congestion from a combination of ESRD, biventicular CHF, pulmonary HTN and TR.  Will perform hepatic venous pressure measurements in AM.  Suspect the right sided pressures  will all be elevated.    He is distended and it is time for him to have another paracentesis.  Will perform this a the same time as hepatic venous pressures in IR.    If he has passive hepatic congestion, it would be unlikely that he would be accepted as a candidate for renal transplant.    Anemia   This is due to ESRD on dialysis  Will obtain FE panel to assess for iron stores.    Thrombocytopenia   This is secondary to uncertain etiology.  No treatment is required.  The platelet count is adequate for the patient to undergo procedures without the need for platelet transfusion or platelet growth factors.    Chronic C Diff  He has repeated episodes of C Diff despite numerous courses of ABX with flagyl and Vanc.  The plan is to treat C Diff with a stool transplant.    SYSTEM REVIEW:  Constitution systems: Negative for fever, chills, weight gain, weight loss.   Eyes: Negative for visual changes.  ENT: Negative for sore throat, painful swallowing.   Respiratory: Negative for  cough, hemoptysis, SOB.   Cardiology: Negative for chest pain, palpitations.  GI:  Chronic diarrhea.  Abdominal swelling.  GU: Negative for urinary frequency, dysuria, hematuria, nocturia.   Skin: Negative for rash.  Hematology: Negative for easy bruising, blood clots.    Musculo-skelatal: Negative for back pain, muscle pain, weakness.  Neurologic: Negative for headaches, dizziness, vertigo, memory problems not related to HE.  Psychology: Negative for anxiety, depression.     FAMILY HISTORY:  The father is alive and healthy.    The mother has the following chronic diseases: HTN.    There is no family history of liver disease.      SOCIAL HISTORY:  The patient has never been married.    The patient has 2 children,   The patient currently smokes 3 cigarettes daily.    The patient has previously consumed alcohol in excess.  The patient has been abstinent from alcohol since 2009.    The patient used to work as a Production designer, theatre/television/film.    The patient does not  currently work    PHYSICAL EXAMINATION:  VS: per nursing note  General:  No acute distress.   Eyes:  Sclera anicteric.   ENT:  No oral lesions.  Thyroid normal.  Nodes:  No adenopathy.   Skin:  No spider angiomata.  No jaundice.  Respiratory:  Lungs clear to auscultation.   Cardiovascular:  Regular heart rate.  Murmur present.  Positive JVD.  Abdomen:  Distended with obvious ascites.  Extremities:  No lower extremity edema.  No muscle wasting.  Neurologic:  Alert and oriented.  Cranial nerves grossly intact.  No asterixis.    LABORATORY:  Results for HENRIQUE, PAREKH (MRN [id number]) as of 04/21/2017 19:15   Ref. Range 04/21/2017 08:54   WBC Latest Ref Range: 4.1 - 11.1 K/uL 7.2   HGB Latest Ref Range: 12.1 - 17.0 g/dL 9.7 (L)   PLATELET Latest Ref Range: 150 - 400 K/uL 128 (L)   Sodium Latest Ref Range: 136 - 145 mmol/L 141   Potassium Latest Ref Range: 3.5 - 5.1 mmol/L 4.4   Chloride Latest Ref Range: 97 - 108 mmol/L 104   CO2 Latest Ref Range: 21 - 32 mmol/L 26   Glucose Latest Ref Range: 65 - 100 mg/dL 91   BUN Latest Ref Range: 6 - 20 MG/DL 40 (H)   Creatinine Latest Ref Range: 0.70 - 1.30 MG/DL 4.09 (H)     11/1189.  ECHO Heart.  LV dilated.  EF 35-49%.  RV dilated.  Mild Pulmonary HTN with estimated PA pressure 45 mmHg.  Mild TR.      Bryson Ha, MD  [facility]  [address]  [address] [place], [address]  [place], [place]  [address]  [phone]  [facility]

## 2017-04-21 NOTE — Consults (Signed)
[place]: David [name]  [phone]  Requesting/referring provider: Dr. Velta AddisonErena  Reason for Consult: chest pain    HPI: David [name], a 47 y.o. year-old who presents for evaluation of abdominal and chest pain. [facility].  [facility] 5 days of N/V/D.  He reports moving down to the [place] area yesterday, from [place], to live with his sister.  Up until now, he received all [place] medical [place] in [place].  Treated for PNA in November of this year, in [place] hospital, and ultimately developing Cdiff.  He completed treatment 2 weeks ago.  He has ESRD and is on HD MWF via left IJ tunneled catheter.  [place] last dialyzed yesterday in [place], [place].  He does have a H/o CAD, s/p stents x 2 (RCA) in 2010, HTN, HLD, asthma and OSA.  He endorses chest pain over his left chest, more pressure like and states "it goes to my left jaw and left arm".  Troponin 0.01, EKG with NSR and no evidence for ACS.  [facility].  He has not been able to take his meds 2/2 vomiting. He also states he has been unable to eat and drink 2/2 N/V.    Subjective: C/o abdominal pain, left chest pressure with radiation to jaw and left arm.  Denies SOB or palpitations.    Pt needs evaluation for chf with echo, trend troponin for chest pain. He is somnolent, flals asleep during he interview. Denies pain at this time. Laying flat on his right side.     Assessment/Plan:    1. Chest pain - typical features   - EKG with NSR   - Troponin 0.10, trending q 6 hours   - Echo ordered   - Ischemic work up on hold, until resolution of acute issues.  2. Volume overload   - proBNP 35000   - pitting edema of legs   - distended abdomen  3. CAD   - MI in 2010   - s/p stents x 2, (RCA distribution on CT) in 2010   - Coreg, PTA   - Has not followed up with Cardiologist since stent placement   - Lopressor IV - until able to reliably take PO  4. N/V/D   - C.diff cx  sent   - Negative occult stool   - Zofran IV prn   - Hospitalist consulted for admission  5. HTN   - Coreg, Hydralazine and Norvasc PTA - unable to take over last 5 days 2/2 vomiting   - Lopressor IV and Catapres patch  6. HLD   - not on medication currently - start when able to take PO   - Lipid panel ordered  7. ESRD   - HD MWF   - left IJ tunneled catheter   - Will need Nephrology consult   - He states his dry weight is 104 kg  8. OSA   - Witnessed.  Patient confirms completing sleep study, but has not received CPAP  9. Asthma   - HFA rescue inhaler only  10. Tobacco abuse   - daily smoker - discussed quitting, briefly  11. Body mass index is 33.23 kg/m??.   - obese.  Will need diet and exercise teaching.      He  has a past medical history of Chronic kidney disease, HTN (hypertension), and MI (myocardial infarction) (HCC).    Cardiovascular ROS: positive for -  chest pain and edema  negative for - irregular heartbeat, palpitations or shortness of breath  Respiratory ROS: no cough, shortness of breath, or wheezing  Neurological ROS: no TIA or stroke symptoms  All other systems negative except as above.     PE  Vitals:    04/21/17 1224 04/21/17 1226 04/21/17 1230 04/21/17 1300   BP:   (!) 163/96    Pulse:       Resp:       Temp:       SpO2:  100%  99%   Weight: 245 lb (111.1 kg)      Height: 6' (1.829 m)         Body mass index is 33.23 kg/m??.  General appearance - alert, well appearing, and in no distress  Mental status - affect appropriate to mood  Eyes - sclera anicteric, moist mucous membranes  Neck - supple, no significant adenopathy  Lymphatics - no  lymphadenopathy  Chest - clear to auscultation, no wheezes, rales or rhonchi  Heart - normal rate, regular rhythm, normal S1, S2, no murmurs, rubs, clicks or gallops  Abdomen - Firm, nontender, distended, no masses or organomegaly  Back exam - full range of motion, no tenderness  Neurological - cranial nerves II through XII grossly intact, no focal  deficit  Musculoskeletal - no muscular tenderness noted, normal strength  Extremities - peripheral pulses normal, 2+ pitting edema  Skin - normal coloration  no rashes    Recent Labs:  No results found for: CHOL, CHOLX, CHLST, CHOLV, 884269, HDL, LDL, LDLC, DLDLP, TGLX, TRIGL, TRIGP, CHHD, CHHDX  Lab Results   Component Value Date/Time    Creatinine 6.81 (H) 04/21/2017 08:54 AM     Lab Results   Component Value Date/Time    BUN 40 (H) 04/21/2017 08:54 AM     Lab Results   Component Value Date/Time    Potassium 4.4 04/21/2017 08:54 AM     No results found for: HBA1C, HGBE8, HBA1CEXT  Lab Results   Component Value Date/Time    HGB 9.7 (L) 04/21/2017 08:54 AM     Lab Results   Component Value Date/Time    PLATELET 128 (L) 04/21/2017 08:54 AM       Reviewed:  Past Medical History:   Diagnosis Date   ??? Chronic kidney disease    ??? HTN (hypertension)    ??? MI (myocardial infarction) (HCC) 2010     Social History     Tobacco Use   Smoking Status Never Smoker   Smokeless Tobacco Never Used     Social History     Substance and Sexual Activity   Alcohol Use No   ??? Frequency: Never     Allergies   Allergen Reactions   ??? Lisinopril Shortness of Breath   ??? Morphine Shortness of Breath   ??? Toradol [Ketorolac] Shortness of Breath   ??? Tramadol Shortness of Breath       Current Facility-Administered Medications   Medication Dose Route Frequency   ??? nitroglycerin (NITROSTAT) tablet 0.4 mg  0.4 mg SubLINGual Q5MIN PRN   ??? nitroglycerin (NITROSTAT) 0.4 mg tablet       ??? ondansetron (ZOFRAN) 4 mg/2 mL injection       ??? HYDROmorphone (DILAUDID) injection 0.5 mg  0.5 mg IntraVENous ONCE   ??? metoprolol (LOPRESSOR) injection 5 mg  5 mg IntraVENous Q6H   ??? cloNIDine (CATAPRES) 0.2 mg/24 hr patch 1 Patch  1 Patch TransDERmal Q7D  Current Outpatient Medications   Medication Sig   ??? carvedilol (COREG) 12.5 mg tablet Take 12.5 mg by mouth two (2) times daily (with meals).   ??? hydrALAZINE (APRESOLINE) 100 mg tablet Take 100 mg by mouth three (3)  times daily.   ??? sevelamer (RENAGEL) 800 mg tablet Take  by mouth three (3) times daily (with meals).   ??? amLODIPine (NORVASC) 10 mg tablet Take 10 mg by mouth daily.   ??? albuterol (PROVENTIL HFA, VENTOLIN HFA, PROAIR HFA) 90 mcg/actuation inhaler Take  by inhalation.       Algis Downsyler R Howard, PA-C  [facility]  [address], [address]  [place], [place] [id number]

## 2017-04-21 NOTE — Consults (Signed)
Consults by  Briscoe DeutscherSeeman, Ania Levay J, MD at 04/21/17 1517                Author: Briscoe DeutscherSeeman, Kreed Kauffman J, MD  Service: Gastroenterology  Author Type: Physician       Filed: 04/21/17 1537  Date of Service: 04/21/17 1517  Status: Addendum          Editor: Briscoe DeutscherSeeman, Krishawna Stiefel J, MD (Physician)          Related Notes: Original Note by Lafayette DragonSandel, Molly M, NP (Nurse Practitioner) filed at 04/21/17 1531            Consult Orders        1. IP CONSULT TO GASTROENTEROLOGY [[id number]][[id number]] ordered by Rebeca AllegraMathur, Muktak, MD at 04/21/17 1430                                 [place]     [facility]    [address]   [place] [id number]           GASTROENTEROLOGY CONSULTATION NOTE   [name], [name]   [phone] office   [phone] NP in-hospital cell phone M-F until 4:30   After 5pm or on weekends, please call operator for physician on call             NAME:  David [name]     DOB:   12/01/1969    MRN:   [id number]           Referring Physician: Rebeca AllegraMuktak Mathur       Consult Date: 04/21/2017  3:17 PM      Chief Complaint: diarrhea       History of Present Illness:  Patient is a  47 y.o. who is seen in consultation at the request of Dr. Christie BeckersMathur for diarrhea. Multiple medical problems listed below. Patient reports having c diff infections 15 times over the past year - failed treatment  with oral vancomycin/flagyl and dificid. He reports last completed vanc/flagyl 2 weeks ago. He has been having nausea, vomiting, diarrhea, with BRBPR since Sunday. He reports recurrent ascites of unknown origin. He reports a liver biopsy that did not  show cause. He reports getting paracentesis every 2 weeks over the past year.       He reports colonoscopy ~2010 with hemorrhoids (had surgery), polyps   He's never had an EGD. He denies alcohol. He uses some BC powder. He smokes a few cigarettes per day.      I have reviewed the emergency room note, hospital admission note, notes by all other clinicians who have seen the patient during this hospitalization to date.  I have reviewed the problem list and the  reason for this hospitalization. I have reviewed the allergies and the medications the patient was taking at home prior to this hospitalization.      PMH:     Past Medical History:        Diagnosis  Date         ?  Chronic kidney disease       ?  HTN (hypertension)           ?  MI (myocardial infarction) (HCC)  2010           PSH:     Past Surgical History:         Procedure  Laterality  Date          ?  ABDOMEN  SURGERY PROC UNLISTED              umbilical hernia and right groin hernia 2016          ?  HX CORONARY STENT PLACEMENT    2010           Allergies:     Allergies        Allergen  Reactions         ?  Lisinopril  Shortness of Breath and Angioedema     ?  Morphine  Shortness of Breath and Angioedema     ?  Toradol [Ketorolac]  Shortness of Breath and Angioedema         ?  Tramadol  Shortness of Breath           Home Medications:     Prior to Admission Medications     Prescriptions  Last Dose  Informant  Patient Reported?  Taking?      albuterol (PROVENTIL HFA, VENTOLIN HFA, PROAIR HFA) 90 mcg/actuation inhaler  04/21/2017 at Unknown time    Yes  Yes      Sig: Take  by inhalation.      amLODIPine (NORVASC) 10 mg tablet  04/17/2017    Yes  No      Sig: Take 10 mg by mouth daily.      carvedilol (COREG) 12.5 mg tablet  04/17/2017    Yes  No      Sig: Take 12.5 mg by mouth two (2) times daily (with meals).      furosemide (LASIX) 80 mg tablet  04/17/2017    Yes  Yes      Sig: Take 80 mg by mouth daily.      hydrALAZINE (APRESOLINE) 100 mg tablet  04/17/2017    Yes  No      Sig: Take 100 mg by mouth three (3) times daily.      sevelamer (RENAGEL) 800 mg tablet  04/17/2017    Yes  No      Sig: Take  by mouth three (3) times daily (with meals).               Facility-Administered Medications: None           Hospital Medications:     Current Facility-Administered Medications          Medication  Dose  Route  Frequency           ?  metoprolol (LOPRESSOR) injection 5 mg   5 mg   IntraVENous  Q6H     ?  cloNIDine (CATAPRES) 0.2 mg/24 hr patch 1 Patch   1 Patch  TransDERmal  Q7D           ?  nitroglycerin (NITROBID) 2 % ointment 0.5 Inch   0.5 Inch  Topical  ONCE          Current Outpatient Medications        Medication  Sig         ?  albuterol (PROVENTIL HFA, VENTOLIN HFA, PROAIR HFA) 90 mcg/actuation inhaler  Take  by inhalation.         ?  furosemide (LASIX) 80 mg tablet  Take 80 mg by mouth daily.     ?  carvedilol (COREG) 12.5 mg tablet  Take 12.5 mg by mouth two (2) times daily (with meals).     ?  hydrALAZINE (APRESOLINE) 100 mg tablet  Take 100 mg by mouth three (3) times daily.     ?  sevelamer (RENAGEL) 800 mg tablet  Take  by mouth three (3) times daily (with meals).         ?  amLODIPine (NORVASC) 10 mg tablet  Take 10 mg by mouth daily.           Social History:     Social History          Tobacco Use         ?  Smoking status:  Never Smoker     ?  Smokeless tobacco:  Never Used       Substance Use Topics         ?  Alcohol use:  No              Frequency:  Never           Family History:   History reviewed. No pertinent family history.      Review of Systems:      Constitutional: +fever, negative chills, +weight loss   Eyes:   negative visual changes   ENT:   negative sore throat, tongue or lip swelling   Respiratory:  negative cough, +dyspnea   Cards:  +for chest pain and lower extremity edema, negative palpitations    GI:   See HPI   GU:  negative for frequency, dysuria   Integument:  negative for rash and pruritus   Heme:  negative for easy bruising and gum/nose bleeding   Musculoskeletal: negative for myalgias, back pain and muscle weakness   Neuro:             negative for headaches, dizziness, vertigo   Psych:  negative for feelings of anxiety, depression         Objective:        Patient Vitals for the past 8 hrs:              BP  Temp  Pulse  Resp  SpO2  Height  Weight              04/21/17 1441  --  97.9 ??F (36.6 ??C)  95  18  99 %  --  --              04/21/17 1300   165/90  --  --  --  99 %  --  --     04/21/17 1230  (!) 163/96  --  --  --  --  --  --     04/21/17 1226  --  --  --  --  100 %  --  --     04/21/17 1224  --  --  --  --  --  6' (1.829 m)  111.1 kg (245 lb)              04/21/17 1220  (!) 165/99  98.6 ??F (37 ??C)  95  16  100 %  --  --        No intake/output data recorded.   No intake/output data recorded.      EXAM:      CONST:  Pleasant male sitting on edge of bed, no acute distress    NEURO:  alert and oriented x 3    HEENT: EOMI, no scleral icterus    LUNGS: clear to ausculation     CARD:  regular rate,S1 S2    ABD:  +ascites, generalized tenderness, no rebound, bowel sounds (+) all 4 quadrants, no masses, distended    EXT:  +  edema, warm    PSYCH: full, not anxious       Data Review         Recent Labs           04/21/17   0854     WBC  7.2     HGB  9.7*     HCT  30.5*        PLT  128*          Recent Labs           04/21/17   0854     NA  141     K  4.4     CL  104     CO2  26     BUN  40*     CREA  6.81*     GLU  91        CA  6.9*        No results for input(s): SGOT, GPT, AP, TBIL, TP, ALB, GLOB, GGT, AML, LPSE in the last 72 hours.      No lab exists for component: AMYP, HLPSE   No results for input(s): INR, PTP, APTT in the last 72 hours.      No lab exists for component: INREXT     Assessment:     ??    Diarrhea/nausea/vomiting: likely c diff - history 15x since December 2017   ??  Recurrent ascites: unclear cause; platelets 128   ??  ESRD: on HD   ??  Chest pain and shortness of breath          Patient Active Problem List        Diagnosis  Code         ?  Chest pain  R07.9          Plan:     ??    Oral vanc started, try antiemetics if difficulty tolerating PO   ??  Stool studies pending   ??  Monitor CBC   ??  Therapeutic paracentesis    ??  Plan for colonoscopy with fecal transplant Monday at 3:30pm    ??  Dr. Christy Gentles discussed with Dr. Larose Kells    ??  Thank you for allowing me to participate in care of David [name]           Signed By:  Lafayette Dragon, NP         04/21/2017  3:17 PM        Patient seen and examined, agree with plans, very complication. He has had c. Diff. At least 10 times in the past year. I feel certain he has it again. I have arranged for Dr. Patrecia Pace to do colonoscopy  and fecal transplanatation on Monday afternoon. I would keep him on oral vancomycin until it can be done. In addition he has refractory ascites. Workup in [place] including liver biopsy has been negative. He has required paracentesis every 2 weeks to  maintain comfort. I talked with Dr. Larose Kells and he has agreed to see and evaluate. Thanks, will follow.      Briscoe Deutscher, MD

## 2017-04-22 ENCOUNTER — Encounter

## 2017-04-22 ENCOUNTER — Inpatient Hospital Stay: Payer: MEDICARE | Attending: Gastroenterology

## 2017-04-22 DIAGNOSIS — R188 Other ascites: Secondary | ICD-10-CM

## 2017-04-22 LAB — TSH 3RD GENERATION: TSH: 1.84 u[IU]/mL (ref 0.36–3.74)

## 2017-04-22 LAB — EKG, 12 LEAD, INITIAL
Atrial Rate: 100 {beats}/min
Calculated P Axis: 66 degrees
Calculated R Axis: -20 degrees
Calculated T Axis: 79 degrees
Diagnosis: NORMAL
P-R Interval: 178 ms
Q-T Interval: 372 ms
QRS Duration: 84 ms
QTC Calculation (Bezet): 479 ms
Ventricular Rate: 100 {beats}/min

## 2017-04-22 LAB — LIPID PANEL
CHOL/HDL Ratio: 2.1 (ref 0–5.0)
Cholesterol, total: 136 MG/DL (ref <200)
HDL Cholesterol: 64 MG/DL
LDL, calculated: 47 MG/DL (ref 0–100)
Triglyceride: 125 MG/DL (ref <150)
VLDL, calculated: 25 MG/DL

## 2017-04-22 LAB — C. DIFFICILE AG & TOXIN A/B
C. difficile toxin: NEGATIVE
GDH ANTIGEN: NEGATIVE
INTERPRETATION: NEGATIVE

## 2017-04-22 LAB — MAGNESIUM: Magnesium: 1.9 mg/dL (ref 1.6–2.4)

## 2017-04-22 LAB — TROPONIN I: Troponin-I, Qt.: 0.09 ng/mL — ABNORMAL HIGH (ref <0.05)

## 2017-04-22 LAB — LIPASE: Lipase: 142 U/L (ref 73–393)

## 2017-04-22 LAB — EKG 12-LEAD
Atrial Rate: 100 {beats}/min
Diagnosis: NORMAL
P Axis: 66 degrees
P-R Interval: 178 ms
Q-T Interval: 372 ms
QRS Duration: 84 ms
QTc Calculation (Bazett): 479 ms
R Axis: -20 degrees
T Axis: 79 degrees
Ventricular Rate: 100 {beats}/min

## 2017-04-22 MED ORDER — L.ACIDOPH & PARACASEI-S.THERMOPHIL-BIFIDOBACTERIUM 8 BILLION CELL CAP
8 billion cell | Freq: Every day | ORAL | Status: DC
Start: 2017-04-22 — End: 2017-04-22

## 2017-04-22 MED FILL — VANCOMYCIN 50 MG/ML ORAL SOLUTION: 50 mg/mL | ORAL | Qty: 10

## 2017-04-25 NOTE — Anesthesia Pre-Procedure Evaluation (Addendum)
Anesthetic History   No history of anesthetic complications            Review of Systems / Medical History  Patient summary reviewed, nursing notes reviewed and pertinent labs reviewed    Pulmonary  Within defined limits                 Neuro/Psych   Within defined limits           Cardiovascular    Hypertension: well controlled          CAD         GI/Hepatic/Renal  Within defined limits              Endo/Other  Within defined limits           Other Findings              Physical Exam    Airway  Mallampati: II  TM Distance: > 6 cm  Neck ROM: normal range of motion   Mouth opening: Normal     Cardiovascular  Regular rate and rhythm,  S1 and S2 normal,  no murmur, click, rub, or gallop             Dental  No notable dental hx       Pulmonary  Breath sounds clear to auscultation               Abdominal  GI exam deferred       Other Findings            Anesthetic Plan    ASA: 2  Anesthesia type: MAC            Anesthetic plan and risks discussed with: Patient

## 2017-05-01 ENCOUNTER — Emergency Department: Admit: 2017-05-01 | Payer: MEDICARE

## 2017-05-01 ENCOUNTER — Inpatient Hospital Stay
Admit: 2017-05-01 | Discharge: 2017-05-03 | Discharge status: Against Medical Advice | Payer: MEDICARE | Attending: Internal Medicine | Admitting: Internal Medicine

## 2017-05-01 ENCOUNTER — Inpatient Hospital Stay

## 2017-05-01 DIAGNOSIS — A0471 Enterocolitis due to Clostridium difficile, recurrent: Principal | ICD-10-CM

## 2017-05-01 LAB — CBC WITH AUTOMATED DIFF
ABS. BASOPHILS: 0 10*3/uL (ref 0.0–0.1)
ABS. EOSINOPHILS: 0.3 10*3/uL (ref 0.0–0.4)
ABS. IMM. GRANS.: 0.1 10*3/uL — ABNORMAL HIGH (ref 0.00–0.04)
ABS. LYMPHOCYTES: 0.5 10*3/uL — ABNORMAL LOW (ref 0.8–3.5)
ABS. MONOCYTES: 0.5 10*3/uL (ref 0.0–1.0)
ABS. NEUTROPHILS: 6.3 10*3/uL (ref 1.8–8.0)
ABSOLUTE NRBC: 0 10*3/uL (ref 0.00–0.01)
BASOPHILS: 0 % (ref 0–1)
EOSINOPHILS: 4 % (ref 0–7)
HCT: 28.2 % — ABNORMAL LOW (ref 36.6–50.3)
HGB: 8.6 g/dL — ABNORMAL LOW (ref 12.1–17.0)
IMMATURE GRANULOCYTES: 1 % — ABNORMAL HIGH (ref 0.0–0.5)
LYMPHOCYTES: 6 % — ABNORMAL LOW (ref 12–49)
MCH: 30.4 PG (ref 26.0–34.0)
MCHC: 30.5 g/dL (ref 30.0–36.5)
MCV: 99.6 FL — ABNORMAL HIGH (ref 80.0–99.0)
MONOCYTES: 6 % (ref 5–13)
MPV: 9.9 FL (ref 8.9–12.9)
NEUTROPHILS: 83 % — ABNORMAL HIGH (ref 32–75)
NRBC: 0 PER 100 WBC
PLATELET: 117 10*3/uL — ABNORMAL LOW (ref 150–400)
RBC: 2.83 M/uL — ABNORMAL LOW (ref 4.10–5.70)
RDW: 16.2 % — ABNORMAL HIGH (ref 11.5–14.5)
WBC: 7.7 10*3/uL (ref 4.1–11.1)

## 2017-05-01 LAB — METABOLIC PANEL, COMPREHENSIVE
A-G Ratio: 0.6 — ABNORMAL LOW (ref 1.1–2.2)
ALT (SGPT): 21 U/L (ref 12–78)
AST (SGOT): 22 U/L (ref 15–37)
Albumin: 2.5 g/dL — ABNORMAL LOW (ref 3.5–5.0)
Alk. phosphatase: 244 U/L — ABNORMAL HIGH (ref 45–117)
Anion gap: 15 mmol/L (ref 5–15)
BUN/Creatinine ratio: 7 — ABNORMAL LOW (ref 12–20)
BUN: 60 MG/DL — ABNORMAL HIGH (ref 6–20)
Bilirubin, total: 0.5 MG/DL (ref 0.2–1.0)
CO2: 24 mmol/L (ref 21–32)
Calcium: 5.8 MG/DL — CL (ref 8.5–10.1)
Chloride: 105 mmol/L (ref 97–108)
Creatinine: 8.76 MG/DL — ABNORMAL HIGH (ref 0.70–1.30)
GFR est AA: 8 mL/min/{1.73_m2} — ABNORMAL LOW (ref >60)
GFR est non-AA: 7 mL/min/{1.73_m2} — ABNORMAL LOW (ref >60)
Globulin: 4.1 g/dL — ABNORMAL HIGH (ref 2.0–4.0)
Glucose: 104 mg/dL — ABNORMAL HIGH (ref 65–100)
Potassium: 4.5 mmol/L (ref 3.5–5.1)
Protein, total: 6.6 g/dL (ref 6.4–8.2)
Sodium: 144 mmol/L (ref 136–145)

## 2017-05-01 LAB — LIPASE: Lipase: 238 U/L (ref 73–393)

## 2017-05-01 LAB — POC LACTIC ACID: Lactic Acid (POC): 0.66 mmol/L (ref 0.40–2.00)

## 2017-05-01 LAB — TROPONIN I: Troponin-I, Qt.: 0.11 ng/mL — ABNORMAL HIGH (ref <0.05)

## 2017-05-01 LAB — MAGNESIUM: Magnesium: 1.6 mg/dL (ref 1.6–2.4)

## 2017-05-01 MED ORDER — ALBUTEROL SULFATE 0.083 % (0.83 MG/ML) SOLN FOR INHALATION
2.530.083 mg /3 mL (0.083 %) | RESPIRATORY_TRACT | Status: AC
Start: 2017-05-01 — End: 2017-05-01
  Administered 2017-05-01: 22:00:00 via RESPIRATORY_TRACT

## 2017-05-01 MED ORDER — HYDROMORPHONE (PF) 2 MG/ML IJ SOLN
2 mg/mL | INTRAMUSCULAR | Status: AC
Start: 2017-05-01 — End: 2017-05-01
  Administered 2017-05-01: 23:00:00 via INTRAVENOUS

## 2017-05-01 MED ORDER — HYDROMORPHONE (PF) 2 MG/ML IJ SOLN
2 mg/mL | INTRAMUSCULAR | Status: AC
Start: 2017-05-01 — End: 2017-05-01
  Administered 2017-05-02: via INTRAVENOUS

## 2017-05-01 MED ORDER — CALCIUM GLUCONATE 100 MG/ML (10%) IV SOLN
100 mg/mL (10%) | INTRAVENOUS | Status: DC
Start: 2017-05-01 — End: 2017-05-01
  Administered 2017-05-01: 22:00:00 via INTRAVENOUS

## 2017-05-01 MED ORDER — CALCIUM GLUCONATE 100 MG/ML (10%) IV SOLN
100 mg/mL (10%) | Freq: Once | INTRAVENOUS | Status: AC
Start: 2017-05-01 — End: 2017-05-01
  Administered 2017-05-01: 23:00:00 via INTRAVENOUS

## 2017-05-01 MED FILL — HYDROMORPHONE (PF) 2 MG/ML IJ SOLN: 2 mg/mL | INTRAMUSCULAR | Qty: 1

## 2017-05-01 MED FILL — CALCIUM GLUCONATE 100 MG/ML (10%) IV SOLN: 100 mg/mL (10%) | INTRAVENOUS | Qty: 10

## 2017-05-01 MED FILL — ALBUTEROL SULFATE 0.083 % (0.83 MG/ML) SOLN FOR INHALATION: 2.5 mg /3 mL (0.083 %) | RESPIRATORY_TRACT | Qty: 1

## 2017-05-01 NOTE — Progress Notes (Signed)
Admission Medication Reconciliation:    Comments/Recommendations:  -Medication history obtained in the emergency department (room 12)  -Confirmed allergy history and preferred pharmacy location  -Patient states he has not taken any medications today    Medications added: None  Medications removed: None  Medications changed: None    Information obtained from: Patient and chart review    Significant PMH/Disease States:   Past Medical History:   Diagnosis Date   ??? Chronic kidney disease    ??? HTN (hypertension)    ??? MI (myocardial infarction) [facility](HCC) 2010       Chief Complaint for this Admission:    Chief Complaint   Patient presents with   ??? Diarrhea   ??? Chest Pain       Allergies:  Lisinopril; Morphine; Toradol [ketorolac]; and Tramadol    Prior to Admission Medications:   Prior to Admission Medications   Prescriptions Last Dose Informant Patient Reported? Taking?   albuterol (PROVENTIL HFA, VENTOLIN HFA, PROAIR HFA) 90 mcg/actuation inhaler 04/30/2017 at AM  Yes Yes   Sig: Take 1 Puff by inhalation every four (4) hours as needed for Wheezing or Shortness of Breath.   amLODIPine (NORVASC) 10 mg tablet 04/30/2017 at AM  Yes Yes   Sig: Take 10 mg by mouth daily.   carvedilol (COREG) 12.5 mg tablet 04/30/2017 at AM  Yes Yes   Sig: Take 12.5 mg by mouth two (2) times daily (with meals).   furosemide (LASIX) 80 mg tablet 04/30/2017 at AM  Yes Yes   Sig: Take 80 mg by mouth daily.   hydrALAZINE (APRESOLINE) 100 mg tablet 04/30/2017 at AM  Yes Yes   Sig: Take 100 mg by mouth three (3) times daily.   sevelamer (RENAGEL) 800 mg tablet 04/30/2017 at AM  Yes Yes   Sig: Take 800 mg by mouth three (3) times daily (with meals).      Facility-Administered Medications: None       Thank you,  Baldwin CrownMatthew Calnan, PHARMD

## 2017-05-01 NOTE — ED Triage Notes (Addendum)
Pt reports he was admitted to [facility] 1 month ago with C-Diff. Reports chest pressure starting last night with SOB, abdominal pain and diarrhea. Pt is a dialysis patient and is anuric. Pt also reports productive cough starting yesterday.

## 2017-05-01 NOTE — ED Notes (Signed)
4th floor called, spoke with Reita Chardonna RN-Nursing Supervisor, relayed room assignment received, this relayed to Baruch Goutyale RN- ED primary nurse.

## 2017-05-01 NOTE — Progress Notes (Signed)
[facility]: 05/01/17      The following medication: Zosyn was automatically dose-adjusted per [facility] P&T Committee Protocol, with respect to renal function.      Consult provided for this   47 y.o. , male , for the indication of intra-abdominal.    Dosage changed to:  Zosyn 3.375 gm IV every 12 hrs    Previous Regimen Zosyn 3.375gm IV every 6 hrs   Serum Creatinine Lab Results   Component Value Date/Time    Creatinine 8.76 (H) 05/01/2017 04:13 PM       Creatinine Clearance Estimated Creatinine Clearance: 13.6 mL/min (A) (based on SCr of 8.76 mg/dL (H)).   BUN Lab Results   Component Value Date/Time    BUN 60 (H) 05/01/2017 04:13 PM           Additional notes:      Pharmacy to continue to monitor patient daily.   Will make dosage adjustments based upon changing renal function.  Signed Barbara CowerJason A. Oretha MilchQuarles, PHARMD. Contact information:  [phone]

## 2017-05-01 NOTE — ED Provider Notes (Signed)
47 y.o. male with past medical history significant for hypertension, chronic kidney disease and MI who presents from home via personal vehicle with chief complaint of diahrrea.  Pt states he has stomach pain, SOB, CP, diarrhea, nausea, vomiting and a cough. Pt states he had C. Diff 1 month ago and was admitted at [facility]. On 04/21/2017 pt was seen at [facility] and stated 2 months prior he was treated for PNA and developed C. Diff the following month. He explained he finished the vancomycin and flagyl regimen 2 weeks prior and but felt like the C. Diff had returned. Pt left [facility] AMA.  Pt states his diarrhea started back up 3 days ago, is very runny and has a foul odor. Pt states he has been nauseated and vomiting for the past 3 days as well. Pt states he had CP that started last night. Pt states he feels similar to previous heart attack and has pain going down his arm. Pt states his abdomen hurts in the middle and on the left side and radiates around his back to his other side. Pt states he has had Cholecystectomy. Pt has a history of asthma. Pt states he has a history of blood clots in his lungs. Pt denies currently being on blood thinners. 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 Pt states he no longer urinates. Pt denies constipation, congestion, and headache. There are no other acute medical concerns at this time.    Social hx: No tobacco use, No EtOH use    Note written by Gwenevere Abbot, Scribe, as dictated by Dr. Dante Gang MD 3:35 PM            The history is provided by the patient. No language interpreter was used.        Past Medical History:   Diagnosis Date   ??? Chronic kidney disease    ??? HTN (hypertension)    ??? MI (myocardial infarction) ([place]) 2010       Past Surgical History:   Procedure Laterality Date   ??? ABDOMEN SURGERY PROC UNLISTED      umbilical hernia and right groin hernia 2016   ??? HX CORONARY STENT PLACEMENT  2010          No family history on file.    Social History     Socioeconomic History   ??? Marital status: SINGLE     Spouse name: Not on file   ??? Number of children: Not on file   ??? Years of education: Not on file   ??? Highest education level: Not on file   Social Needs   ??? Financial resource strain: Not on file   ??? Food insecurity - worry: Not on file   ??? Food insecurity - inability: Not on file   ??? Transportation needs - medical: Not on file   ??? Transportation needs - non-medical: Not on file   Occupational History   ??? Not on file   Tobacco Use   ??? Smoking status: Never Smoker   ??? Smokeless tobacco: Never Used   Substance and Sexual Activity   ??? Alcohol use: No     Frequency: Never   ??? Drug use: No   ??? Sexual activity: Not on file   Other Topics Concern   ??? Not on file   Social History Narrative   ??? Not on file         ALLERGIES: Lisinopril; Morphine; Toradol [  ketorolac]; and Tramadol    Review of Systems   Constitutional: Negative for activity change, appetite change, chills and fever.   HENT: Negative for congestion and sore throat.    Respiratory: Positive for shortness of breath. Negative for cough.    Cardiovascular: Positive for chest pain.   Gastrointestinal: Positive for diarrhea, nausea and vomiting. Negative for abdominal pain.   Genitourinary: Negative for dysuria.   Musculoskeletal: Negative for arthralgias and myalgias.   Skin: Negative for color change.   Neurological: Negative for dizziness.   Psychiatric/Behavioral: The patient is not nervous/anxious.    All other systems reviewed and are negative.      There were no vitals filed for this visit.         Physical Exam   Constitutional: He is oriented to person, place, and time. He appears well-developed and well-nourished.   HENT:   Head: Normocephalic and atraumatic.   Right Ear: External ear normal.   Left Ear: External ear normal.   Mouth/Throat: Oropharynx is clear and moist. No oropharyngeal exudate.    Eyes: Conjunctivae and EOM are normal. Pupils are equal, round, and reactive to light. Right eye exhibits no discharge. Left eye exhibits no discharge. No scleral icterus.   Neck: Normal range of motion. Neck supple. No tracheal deviation present. No thyromegaly present.   Cardiovascular: Regular rhythm, normal heart sounds and intact distal pulses. Tachycardia present.   No murmur heard.  Pulmonary/Chest: Effort normal. No respiratory distress. He has decreased breath sounds. He has wheezes. He has no rales.   Abdominal: Soft. Bowel sounds are normal. He exhibits no distension. There is generalized tenderness. There is no rebound and no guarding.   Musculoskeletal: Normal range of motion. He exhibits no edema or tenderness.   Lymphadenopathy:     He has no cervical adenopathy.   Neurological: He is alert and oriented to person, place, and time. No cranial nerve deficit. Coordination normal.   Skin: Skin is warm. No rash noted. No erythema.   Psychiatric: He has a normal mood and affect. His behavior is normal. Judgment and thought content normal.   Nursing note and vitals reviewed.       MDM  Number of Diagnoses or Management Options  Chest pain, unspecified type:   Diarrhea, unspecified type:   Diagnosis management comments: 6:01 PM  Patient is being admitted to the hospital.  The results of their tests and reasons for their admission have been discussed with them and/or available family.  They convey agreement and understanding for the need to be admitted and for their admission diagnosis.  Consultation has been made with the inpatient physician specialist for hospitalization.    LABORATORY TESTS:  Recent Results (from the past 12 hour(s))  -EKG, 12 LEAD, INITIAL  Collection Time: 05/01/17  3:33 PM       Result                      Value             Ref Range           Ventricular Rate            103               BPM                 Atrial Rate                 103  BPM                  P-R Interval                172               ms                  QRS Duration                80                ms                  Q-T Interval                384               ms                  QTC Calculation (Bezet)     503               ms                  Calculated P Axis           71                degrees             Calculated R Axis           -20               degrees             Calculated T Axis           89                degrees             Diagnosis                                                     Sinus tachycardia Possible Left atrial enlargement Borderline ECG When compared with ECG of 21-Apr-2017 08:33, No significant change was found   -CBC WITH AUTOMATED DIFF  Collection Time: 05/01/17  4:13 PM       Result                      Value             Ref Range           WBC                         7.7               4.1 - 11.1 K*       RBC                         2.83 (L)          4.10 - 5.70 *       HGB                         8.6 (L)  12.1 - 17.0 *       HCT                         28.2 (L)          36.6 - 50.3 %       MCV                         99.6 (H)          80.0 - 99.0 *       MCH                         30.4              26.0 - 34.0 *       MCHC                        30.5              30.0 - 36.5 *       RDW                         16.2 (H)          11.5 - 14.5 %       PLATELET                    117 (L)           150 - 400 K/*       MPV                         9.9               8.9 - 12.9 FL       NRBC                        0.0               0 PER 100 WBC       ABSOLUTE NRBC               0.00              0.00 - 0.01 *       NEUTROPHILS                 83 (H)            32 - 75 %           LYMPHOCYTES                 6 (L)             12 - 49 %           MONOCYTES                   6                 5 - 13 %            EOSINOPHILS                 4  0 - 7 %             BASOPHILS                   0                 0 - 1 %              IMMATURE GRANULOCYTES       1 (H)             0.0 - 0.5 %         ABS. NEUTROPHILS            6.3               1.8 - 8.0 K/*       ABS. LYMPHOCYTES            0.5 (L)           0.8 - 3.5 K/*       ABS. MONOCYTES              0.5               0.0 - 1.0 K/*       ABS. EOSINOPHILS            0.3               0.0 - 0.4 K/*       ABS. BASOPHILS              0.0               0.0 - 0.1 K/*       ABS. IMM. GRANS.            0.1 (H)           0.00 - 0.04 *       DF                          SMEAR SCANNED                         RBC COMMENTS                                                  ANISOCYTOSIS 1+        RBC COMMENTS                                                  OVALOCYTES PRESENT        RBC COMMENTS                                                  SCHISTOCYTES PRESENT   -METABOLIC PANEL, COMPREHENSIVE  Collection Time: 05/01/17  4:13 PM       Result                      Value  Ref Range           Sodium                      144               136 - 145 mm*       Potassium                   4.5               3.5 - 5.1 mm*       Chloride                    105               97 - 108 mmo*       CO2                         24                21 - 32 mmol*       Anion gap                   15                5 - 15 mmol/L       Glucose                     104 (H)           65 - 100 mg/*       BUN                         60 (H)            6 - 20 MG/DL        Creatinine                  8.76 (H)          0.70 - 1.30 *       BUN/Creatinine ratio        7 (L)             12 - 20             GFR est AA                  8 (L)             >60 ml/min/1*       GFR est non-AA              7 (L)             >60 ml/min/1*       Calcium                     5.8 (LL)          8.5 - 10.1 M*       Bilirubin, total            0.5               0.2 - 1.0 MG*       ALT (SGPT)                  21  12 - 78 U/L         AST (SGOT)                  22                15 - 37 U/L          Alk. phosphatase            244 (H)           45 - 117 U/L        Protein, total              6.6               6.4 - 8.2 g/*       Albumin                     2.5 (L)           3.5 - 5.0 g/*       Globulin                    4.1 (H)           2.0 - 4.0 g/*       A-G Ratio                   0.6 (L)           1.1 - 2.2      -LIPASE  Collection Time: 05/01/17  4:13 PM       Result                      Value             Ref Range           Lipase                      238               73 - 393 U/L   -TROPONIN I  Collection Time: 05/01/17  4:13 PM       Result                      Value             Ref Range           Troponin-I, Qt.             0.11 (H)          <0.05 ng/mL    -MAGNESIUM  Collection Time: 05/01/17  4:13 PM       Result                      Value             Ref Range           Magnesium                   1.6               1.6 - 2.4 mg*  -POC LACTIC ACID  Collection Time: 05/01/17  4:15 PM       Result                      Value  Ref Range           Lactic Acid (POC)           0.66              0.40 - 2.00 *    IMAGING RESULTS:  See chart for full report- chest xray concerning for developing pneumonia, patient denies cough, fever and has no elevated wbc.    MEDICATIONS GIVEN:  Medications  calcium gluconate 1 g in 0.9% sodium chloride 100 mL IVPB (1 g IntraVENous New Bag 05/01/17 1747)  albuterol 73m / ipratropium 0.559mneb solution (1 Dose Nebulization Given 05/01/17 1645)  HYDROmorphone (PF) (DILAUDID) injection 1 mg (1 mg IntraVENous Given 05/01/17 1741)    IMPRESSION:  Chest pain, unspecified type  (primary encounter diagnosis)  Diarrhea, unspecified type    PLAN:  1. Admit to hospital         Amount and/or Complexity of Data Reviewed  Clinical lab tests: ordered and reviewed  Tests in the radiology section of CPT??: ordered and reviewed  Tests in the medicine section of CPT??: ordered and reviewed           Procedures         CONSULT NOTE:   6:00 PM   KaRecardo EvangelistA-C spoke with Dr. OmOdette Horns  Specialty: Hospitalist  Discussed pt's hx, disposition, and available diagnostic and imaging results. Reviewed care plans. Consultant agrees with plans as outlined.  Will see for admission.

## 2017-05-01 NOTE — H&P (Signed)
[facility]  [address] [place], [place]  [address]  [phone]  Admission History and Physical      NAME:              David [name]   DOB:   12/01/1969   MRN:  [id number]     PCP:  None     Date:     05/01/2017     Chief  Complaint: Diarrhea    History Of Presenting Illness:       Mr. David [name] is a 47 y.o. male who is being admitted for Diarrhea. Mr. David [name] presented to our Emergency Department today complaining of a persistent watery diarrhea associated with a generalized cramping abdominal discomfort as well as fever. No nausea or vomiting. He had been admitted to [facility] where he signed himself AMA on 04/21/17. He had been reviewed by GI who had planned for a colonoscopy and fecal transplant. He has also complained of a productive cough associated with some SOB. In the ED, a CXR done showed an interval development of groundglass opacity in the right base. A developing pneumonia could not be excluded. CT abdomen was abnormal. He will be admitted for further management.     Allergies   Allergen Reactions   ??? Lisinopril Shortness of Breath and Angioedema   ??? Morphine Shortness of Breath and Angioedema   ??? Toradol [Ketorolac] Shortness of Breath and Angioedema   ??? Tramadol Shortness of Breath       Prior to Admission medications    Medication Sig Start Date End Date Taking? Authorizing Provider   carvedilol (COREG) 12.5 mg tablet Take 12.5 mg by mouth two (2) times daily (with meals).    Other, Phys, MD   hydrALAZINE (APRESOLINE) 100 mg tablet Take 100 mg by mouth three (3) times daily.    Other, Phys, MD   sevelamer (RENAGEL) 800 mg tablet Take  by mouth three (3) times daily (with meals).    Other, Phys, MD   amLODIPine (NORVASC) 10 mg tablet Take 10 mg by mouth daily.    Other, Phys, MD   albuterol (PROVENTIL HFA, VENTOLIN HFA, PROAIR HFA) 90 mcg/actuation  inhaler Take  by inhalation.    Other, Phys, MD   furosemide (LASIX) 80 mg tablet Take 80 mg by mouth daily.    Provider, Historical       Past Medical History:   Diagnosis Date   ??? Chronic kidney disease    ??? HTN (hypertension)    ??? MI (myocardial infarction) (HCC) 2010        Past Surgical History:   Procedure Laterality Date   ??? ABDOMEN SURGERY PROC UNLISTED      umbilical hernia and right groin hernia 2016   ??? HX CORONARY STENT PLACEMENT  2010       Social History     Tobacco Use   ??? Smoking status: Never Smoker   ??? Smokeless tobacco: Never Used   Substance Use Topics   ??? Alcohol use: No     Frequency: Never      Family history: neg for DM      Review of Systems:  Constitutional ROS: subjective fever, chills, rigors but no night sweats  Respiratory ROS: + cough, + sputum, - hemoptysis, + dyspnea   Cardiovascular ROS:+ chest pain but no palpitations, orthopnea, PND or syncope  Endocrine ROS: no polydispsia, polyuria, heat or cold intolerance or major weight change.  Gastrointestinal ROS: no  dysphagia but a generalized abdominal pain, nausea and diarrhea    Genito-Urinary ROS: no dysuria, frequency, hematuria, retention or flank pain  Musculoskeletal ROS: no joint pain, swelling or muscular tenderness  Neurological ROS: no headache, confusion, focal weakness or any other neurological symptoms  Psychiatric ROS: no depression, anxiety, mood swings  Dermatological ROS: no rash, pruritis, or urticaria  Heme-Lymph ROS: no swollen glands, bleeding    Examination:    Constitutional:    Visit Vitals  BP (!) 176/109   Pulse (!) 101   Temp 98.5 ??F (36.9 ??C)   Resp 17   Ht 6\' 1"  (1.854 m)   Wt 111.1 kg (245 lb)   SpO2 95%   BMI 32.32 kg/m??         General:  Weak and ill looking patient in no acute distress  Eyes: Pink conjunctivae, PERRLA with no discharge. Normal eye movements  Ear, Nose, Mouth & Throat: No ottorrhea, rhinorrhea, non tender sinuses, dry mucous membranes   Respiratory:  No accessory muscle use, generally decreased breath sounds with scattered crackles. No wheezes  Cardiovascular:  No JVD or murmurs, regular and normal S1, S2 without thrills, bruits. + peripheral edema. Capillary refil+, poor distal pulses  GI & GU:  Soft abdomen, distended, decreased bowel sounds with no palpable organomegaly  Heme:  No cervical or axillary adenopathy.   Musculoskeletal:  No cyanosis, clubbing, atrophy or deformities  Skin:  No rashes, bruising or ulcers   Neurological: Awake and alert, speech is clear, CNs 2-12 are grossly intact and otherwise non focal  Psychiatric:  Has a fair insight and is oriented x 3  ________________________________________________________________________    Data Review:    Labs:    Recent Labs     05/01/17  1613   WBC 7.7   HGB 8.6*   HCT 28.2*   PLT 117*     Recent Labs     05/01/17  1613   NA 144   K 4.5   CL 105   CO2 24   GLU 104*   BUN 60*   CREA 8.76*   CA 5.8*   MG 1.6   ALB 2.5*   SGOT 22   ALT 21     No components found for: GLPOC  No results for input(s): PH, PCO2, PO2, HCO3, FIO2 in the last 72 hours.  No results for input(s): INR in the last 72 hours.    No lab exists for component: INREXT    Radiological Studies:      Chest X-ray - Interval development of groundglass opacity in the right base described above. Developing pneumonia is not excluded. Clinical correlation is warranted.    CT scan abdomen - No CT examination of the patient's abdominal pain. Likely air-fluid levels in large bowel consistent with a clinical history of diarrhea. Airspace disease and/or interstitial prominence in the lungs which is difficult to appreciate due to respiratory motion. Ascites. Small pericardial effusion and/or thickening. Stranding in the body wall which may represents volume overload/anasarca.     Other Medical tests:    Personally reviewed 12 lead EKG - sinus tachycardia with no ischemic changes     I have also reviewed available old medical records.      Assessment & Impression:     David [name] is a 47 y.o. 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 Admit to hospital. Check C diff. Consult GI. He had been reviewed by team during recent admission at [facility] and plan was for a colonoscopy and fecal transplant, will consult GI. Start oral Vancomycin and follow    ESRD on dialysis [facility]) (04/21/2017): usually on HD on MWFs. Consult nephrology. Resume Sevelamaer    Abnormal CXR POA: concerning for pneumonia. Recently in hospital. Start IV Zosyn.     CAD (coronary artery disease) (05/01/2017): now with a mild troponin bump. Recently seen by cardiology who wanted him stabilized first prior to further cardiac work up. Resume Coreg. Consult cardiology of symptomatic     Ascites (04/21/2017)/   Pulmonary hypertension (HCC) (04/21/2017): has been reviewed by hepatology who attributed this to a passive hepatic congestion from a combination of ESRD, biventicular CHF, pulmonary HTN and TR. GI to review    Thrombocytopenia (HCC) (04/21/2017): likely     Anemia due to chronic kidney disease (05/01/2017): monitor Hgb and transfuse as needed    Code Status:  Full    Surrogate decision maker: Family    Risk of deterioration: high      Total time spent for the care of the patient: 13 Minutes                  Care Plan discussed with: Patient, Nursing Staff and ED physician    Discussed:  Code Status, Care Plan and D/C Planning    Prophylaxis:  Hep SQ    Probable Disposition:  Home w/Family           ___________________________________________________     Attending Physician: Melynda Keller, MD

## 2017-05-02 LAB — EKG, 12 LEAD, INITIAL
Atrial Rate: 103 {beats}/min
Calculated P Axis: 71 degrees
Calculated R Axis: -20 degrees
Calculated T Axis: 89 degrees
P-R Interval: 172 ms
Q-T Interval: 384 ms
QRS Duration: 80 ms
QTC Calculation (Bezet): 503 ms
Ventricular Rate: 103 {beats}/min

## 2017-05-02 LAB — EKG 12-LEAD
Atrial Rate: 103 {beats}/min
P Axis: 71 degrees
P-R Interval: 172 ms
Q-T Interval: 384 ms
QRS Duration: 80 ms
QTc Calculation (Bazett): 503 ms
R Axis: -20 degrees
T Axis: 89 degrees
Ventricular Rate: 103 {beats}/min

## 2017-05-02 MED ORDER — ADV ADDAPTOR
3.375 gram | Freq: Once | Status: AC
Start: 2017-05-02 — End: 2017-05-01
  Administered 2017-05-02: 03:00:00 via INTRAVENOUS

## 2017-05-02 MED ORDER — .PHARMACY TO SUBSTITUTE PER PROTOCOL
Status: DC | PRN
Start: 2017-05-02 — End: 2017-05-01

## 2017-05-02 MED ORDER — FUROSEMIDE 40 MG TAB
40 mg | Freq: Every day | ORAL | Status: DC
Start: 2017-05-02 — End: 2017-05-03
  Administered 2017-05-03: 15:00:00 via ORAL

## 2017-05-02 MED ORDER — EPOETIN ALFA 10,000 UNIT/ML IJ SOLN
10000 unit/mL | INTRAMUSCULAR | Status: DC
Start: 2017-05-02 — End: 2017-05-03
  Administered 2017-05-03: 03:00:00 via INTRAVENOUS

## 2017-05-02 MED ORDER — SEVELAMER CARBONATE 800 MG TAB
800 mg | Freq: Three times a day (TID) | ORAL | Status: DC
Start: 2017-05-02 — End: 2017-05-03
  Administered 2017-05-02 – 2017-05-03 (×4): via ORAL

## 2017-05-02 MED ORDER — VANCOMYCIN 50 MG/ML ORAL SOLUTION
50 mg/mL | Freq: Four times a day (QID) | ORAL | Status: DC
Start: 2017-05-02 — End: 2017-05-03
  Administered 2017-05-02 – 2017-05-03 (×7): via ORAL

## 2017-05-02 MED ORDER — IPRATROPIUM-ALBUTEROL 2.5 MG-0.5 MG/3 ML NEB SOLUTION
2.5 mg-0.5 mg/3 ml | RESPIRATORY_TRACT | Status: DC | PRN
Start: 2017-05-02 — End: 2017-05-03
  Administered 2017-05-02 – 2017-05-03 (×8): via RESPIRATORY_TRACT

## 2017-05-02 MED ORDER — NITROGLYCERIN 0.4 MG SUBLINGUAL TAB
0.4 mg | SUBLINGUAL | Status: DC | PRN
Start: 2017-05-02 — End: 2017-05-03
  Administered 2017-05-02: 15:00:00 via SUBLINGUAL

## 2017-05-02 MED ORDER — CARVEDILOL 6.25 MG TAB
6.25 mg | Freq: Two times a day (BID) | ORAL | Status: DC
Start: 2017-05-02 — End: 2017-05-03
  Administered 2017-05-02 – 2017-05-03 (×4): via ORAL

## 2017-05-02 MED ORDER — HYDRALAZINE 25 MG TAB
25 mg | Freq: Three times a day (TID) | ORAL | Status: DC
Start: 2017-05-02 — End: 2017-05-03
  Administered 2017-05-02 – 2017-05-03 (×3): via ORAL

## 2017-05-02 MED ORDER — IPRATROPIUM-ALBUTEROL 2.5 MG-0.5 MG/3 ML NEB SOLUTION
2.5 mg-0.5 mg/3 ml | Freq: Four times a day (QID) | RESPIRATORY_TRACT | Status: DC | PRN
Start: 2017-05-02 — End: 2017-05-02
  Administered 2017-05-02: 06:00:00 via RESPIRATORY_TRACT

## 2017-05-02 MED ORDER — HYDRALAZINE 20 MG/ML IJ SOLN
20 mg/mL | Freq: Four times a day (QID) | INTRAMUSCULAR | Status: DC | PRN
Start: 2017-05-02 — End: 2017-05-03
  Administered 2017-05-03: 13:00:00 via INTRAVENOUS

## 2017-05-02 MED ORDER — L.ACIDOPH & PARACASEI-S.THERMOPHIL-BIFIDOBACTERIUM 8 BILLION CELL CAP
8 billion cell | Freq: Every day | ORAL | Status: DC
Start: 2017-05-02 — End: 2017-05-03
  Administered 2017-05-03: 15:00:00 via ORAL

## 2017-05-02 MED ORDER — SODIUM CHLORIDE 0.9 % IV PIGGY BACK
3.375 gram | Freq: Two times a day (BID) | INTRAVENOUS | Status: DC
Start: 2017-05-02 — End: 2017-05-03
  Administered 2017-05-02 – 2017-05-03 (×3): via INTRAVENOUS

## 2017-05-02 MED ORDER — HYDROMORPHONE (PF) 2 MG/ML IJ SOLN
2 mg/mL | INTRAMUSCULAR | Status: DC | PRN
Start: 2017-05-02 — End: 2017-05-03
  Administered 2017-05-02 – 2017-05-03 (×10): via INTRAVENOUS

## 2017-05-02 MED ORDER — ASPIRIN 81 MG CHEWABLE TAB
81 mg | Freq: Every day | ORAL | Status: DC
Start: 2017-05-02 — End: 2017-05-03
  Administered 2017-05-03: 15:00:00 via ORAL

## 2017-05-02 MED ORDER — AMLODIPINE 5 MG TAB
5 mg | Freq: Every day | ORAL | Status: DC
Start: 2017-05-02 — End: 2017-05-03
  Administered 2017-05-03: 15:00:00 via ORAL

## 2017-05-02 MED ORDER — HYDRALAZINE 20 MG/ML IJ SOLN
20 mg/mL | Freq: Four times a day (QID) | INTRAMUSCULAR | Status: DC | PRN
Start: 2017-05-02 — End: 2017-05-01

## 2017-05-02 MED FILL — VANCOMYCIN 50 MG/ML ORAL SOLUTION: 50 mg/mL | ORAL | Qty: 2.5

## 2017-05-02 MED FILL — FUROSEMIDE 40 MG TAB: 40 mg | ORAL | Qty: 2

## 2017-05-02 MED FILL — IPRATROPIUM-ALBUTEROL 2.5 MG-0.5 MG/3 ML NEB SOLUTION: 2.5 mg-0.5 mg/3 ml | RESPIRATORY_TRACT | Qty: 3

## 2017-05-02 MED FILL — HYDROMORPHONE (PF) 2 MG/ML IJ SOLN: 2 mg/mL | INTRAMUSCULAR | Qty: 1

## 2017-05-02 MED FILL — CARVEDILOL 6.25 MG TAB: 6.25 mg | ORAL | Qty: 2

## 2017-05-02 MED FILL — PIPERACILLIN-TAZOBACTAM 3.375 GRAM IV SOLR: 3.375 gram | INTRAVENOUS | Qty: 3.38

## 2017-05-02 MED FILL — SEVELAMER CARBONATE 800 MG TAB: 800 mg | ORAL | Qty: 1

## 2017-05-02 MED FILL — NITROGLYCERIN 0.4 MG SUBLINGUAL TAB: 0.4 mg | SUBLINGUAL | Qty: 1

## 2017-05-02 MED FILL — PROCRIT 10,000 UNIT/ML INJECTION SOLUTION: 10000 unit/mL | INTRAMUSCULAR | Qty: 1

## 2017-05-02 MED FILL — AMLODIPINE 5 MG TAB: 5 mg | ORAL | Qty: 2

## 2017-05-02 MED FILL — HYDRALAZINE 25 MG TAB: 25 mg | ORAL | Qty: 4

## 2017-05-02 MED FILL — CARVEDILOL 12.5 MG TAB: 12.5 mg | ORAL | Qty: 1

## 2017-05-02 NOTE — Progress Notes (Signed)
Patient is complaining of increase in chest and abdominal pain (non-radiating).  Called and spoke with Dr. Rubbie BattiestBridge.  She is ordering Nitro, 12 lead EKG and another administration on dilaudid to ensure comfort.  Will continue to monitor

## 2017-05-02 NOTE — Progress Notes (Signed)
Patient complains of chest pain and abdominal pain unrelieved by current pain regiment.  Has requested Respiratory come see him.  Called respiratory to come give him a breathing treatment.  Called Dr. Rubbie BattiestBridge to update on pain management effectiveness.  Per Dr. Rubbie BattiestBridge, 1 mg diluadid is adequate.  Will continue to monitor.  VS are within normal limits.

## 2017-05-02 NOTE — Consults (Addendum)
[place] [facility]  Mariea Stable NP  [phone]                    GASTROENTEROLOGY CONSULTATION NOTE            NAME:  David [name]   DOB:   12/01/1969   MRN:   [id number]       Referring Physician:   Dr. Lorette Ang      Consult Date:   05/02/2017 2:08 PM    Chief Complaint:    Diarrhea     History of Present Illness:    Patient is a 47 y.o. who we were asked to see in consultation for the above complaint.  The patient has a history of several recurrences of C Diff diarrhea.  The diarrhea started again 4 days ago.  He had been having up to 7 BMs per day.  His symptoms have been associated with nausea, vomiting, fevers and abdominal pain.  He describes the abdominal pain sharp,radiating across the middle of his abomen and it is made worse with bowel movements.  He denies recent antibiotic use.       He also has a history of refractory ascites.  He has had prior work up for this which includes a negative liver biopsy.  He is currently followed by Dr. Larose Kells.  He has been having paracentesis done every 2 weeks.  It was last done about 2 weeks ago.    PMH:  Past Medical History:   Diagnosis Date   ??? Chronic kidney disease    ??? HTN (hypertension)    ??? MI (myocardial infarction) (HCC) 2010       PSH:  Past Surgical History:   Procedure Laterality Date   ??? ABDOMEN SURGERY PROC UNLISTED      umbilical hernia and right groin hernia 2016   ??? HX CORONARY STENT PLACEMENT  2010       Allergies:  Allergies   Allergen Reactions   ??? Lisinopril Shortness of Breath and Angioedema   ??? Morphine Shortness of Breath and Angioedema   ??? Toradol [Ketorolac] Shortness of Breath and Angioedema   ??? Tramadol Shortness of Breath       Home Medications:  Prior to Admission Medications   Prescriptions Last Dose Informant Patient Reported? Taking?   albuterol (PROVENTIL HFA, VENTOLIN HFA, PROAIR HFA) 90 mcg/actuation inhaler 04/30/2017 at AM  Yes Yes   Sig: Take 1 Puff by inhalation every four (4) hours as needed for Wheezing  or Shortness of Breath.   amLODIPine (NORVASC) 10 mg tablet 04/30/2017 at AM  Yes Yes   Sig: Take 10 mg by mouth daily.   carvedilol (COREG) 12.5 mg tablet 04/30/2017 at AM  Yes Yes   Sig: Take 12.5 mg by mouth two (2) times daily (with meals).   furosemide (LASIX) 80 mg tablet 04/30/2017 at AM  Yes Yes   Sig: Take 80 mg by mouth daily.   hydrALAZINE (APRESOLINE) 100 mg tablet 04/30/2017 at AM  Yes Yes   Sig: Take 100 mg by mouth three (3) times daily.   sevelamer (RENAGEL) 800 mg tablet 04/30/2017 at AM  Yes Yes   Sig: Take 800 mg by mouth three (3) times daily (with meals).      Facility-Administered Medications: None       Hospital Medications:  Current Facility-Administered Medications   Medication Dose Route Frequency   ??? albuterol-ipratropium (DUO-NEB) 2.5 MG-0.5 MG/3 ML  3 mL Nebulization Q4H PRN   ???  nitroglycerin (NITROSTAT) tablet 0.4 mg  0.4 mg SubLINGual PRN   ??? epoetin alfa (EPOGEN;PROCRIT) injection 10,000 Units  10,000 Units IntraVENous DIALYSIS MON, WED & FRI   ??? [START ON 05/03/2017] aspirin chewable tablet 81 mg  81 mg Oral DAILY   ??? vancomycin (FIRVANQ) 50 mg/mL oral solution 125 mg  125 mg Oral Q6H   ??? piperacillin-tazobactam (ZOSYN) 3.375 g in 0.9% sodium chloride (MBP/ADV) 100 mL  3.375 g IntraVENous Q12H   ??? amLODIPine (NORVASC) tablet 10 mg  10 mg Oral DAILY   ??? carvedilol (COREG) tablet 12.5 mg  12.5 mg Oral BID WITH MEALS   ??? hydrALAZINE (APRESOLINE) tablet 100 mg  100 mg Oral TID   ??? furosemide (LASIX) tablet 80 mg  80 mg Oral DAILY   ??? hydrALAZINE (APRESOLINE) 20 mg/mL injection 20 mg  20 mg IntraVENous Q6H PRN   ??? HYDROmorphone (PF) (DILAUDID) injection 1 mg  1 mg IntraVENous Q4H PRN   ??? sevelamer carbonate (RENVELA) tab 800 mg  800 mg Oral TID WITH MEALS       Social History:  Social History     Tobacco Use   ??? Smoking status: Never Smoker   ??? Smokeless tobacco: Never Used   Substance Use Topics   ??? Alcohol use: No     Frequency: Never       Family History:  No family history on file.     Review of Systems:  Constitutional: Positive fever, negative chills, negative weight loss  Eyes:   negative visual changes  ENT:   negative sore throat, tongue or lip swelling  Respiratory:  negative cough, negative dyspnea  Cards:  negative for chest pain, palpitations, lower extremity edema  GI:   See HPI  GU:  negative for frequency, dysuria  Integument:  negative for rash and pruritus  Heme:  negative for easy bruising and gum/nose bleeding  Musculoskel: negative for myalgias,  back pain and muscle weakness  Neuro: negative for headaches, dizziness, vertigo  Psych:  negative for feelings of anxiety, depression     Objective:     Patient Vitals for the past 8 hrs:   Pulse   05/02/17 0703 81     No intake/output data recorded.  01/12 1901 - 01/14 0700  In: 560 [P.O.:360; I.V.:200]  Out: -     EXAM:     NEURO-alert, oriented x3, affect appropriate   HEENT-Head: Normocephalic, no lesions, without obvious abnormality.   LUNGS-clear to auscultation bilaterally    COR-regular rate and rhythym     ABD- Firm, Not TTP. Bowel sounds normal. No masses,  no organomegaly     EXT-no edema    Skin - No rash     Data Review     Recent Labs     05/01/17  1613   WBC 7.7   HGB 8.6*   HCT 28.2*   PLT 117*     Recent Labs     05/01/17  1613   NA 144   K 4.5   CL 105   CO2 24   BUN 60*   CREA 8.76*   GLU 104*   CA 5.8*     Recent Labs     05/01/17  1613   SGOT 22   AP 244*   TP 6.6   ALB 2.5*   GLOB 4.1*   LPSE 238     No results for input(s): INR, PTP, APTT in the last 72 hours.    No  lab exists for component: INREXT    Patient Active Problem List   Diagnosis Code   ??? Chest pain R07.9   ??? 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 Ascites R18.8   ??? C. difficile colitis A04.72   ??? Thrombocytopenia (HCC) D69.6   ??? Anemia D64.9   ??? Pulmonary hypertension (HCC) I27.20   ??? 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 Tricuspid regurgitation I07.1   ??? Diarrhea R19.7   ??? 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 CAD (coronary artery disease) I25.10       Assessment and Plan:  Diarrhea:  Likely recurrence of C Diff.  Will follow results of stool studies which are pending collection.  - Continue Oral Vancomycin   - Started probiotic.  - Diet as tolerated.  - Monitor Labs.  - Fecal transplant later this week after his PNA has improved.    Ascites:  Refractory.  Unclear etiology.  - US paracentesis with fluid analysis tomorrow.  - Continue Diuretics.        Thanks for allowing me to participate in the care of this patient.  Signed By: Mariea Stable, NP     05/02/2017  2:08 PM

## 2017-05-02 NOTE — Progress Notes (Signed)
Patient was a code L.  Removed telemetry.  Patient was found full clothed in lobby and escorted by Shift nursing supervisor, Marijean NiemannJaime on the way back up.

## 2017-05-02 NOTE — Progress Notes (Signed)
Patient was uncooperative with morning labs. Patient wanted us to use his dialysis fistula to get his blood work. Explained to patient that we could not get blood from the fistula because it is not accessed. Patient then refused AM lab work.

## 2017-05-02 NOTE — Progress Notes (Signed)
Per Marisue IvanLiz, PCT, patient has refused to lay down for 12 lead EKG.  Patient does not appear to be SOB. Will administer meds and continue to monitor.

## 2017-05-02 NOTE — Progress Notes (Signed)
Per Dr. Rubbie BattiestBridge, Place regular diet order telephone readback

## 2017-05-02 NOTE — Progress Notes (Addendum)
Called the Dialysis center to get an ETA for their arrival. Mail box is full and could not leave a message.    Rec call back from Dialysis oncall. She will be coming at some point this evening.

## 2017-05-02 NOTE — Progress Notes (Signed)
..  Bedside shift change report given to Elease HashimotoPatricia, RN (oncoming nurse) by [name], RN (offgoing nurse). Report included the following information SBAR, Kardex, ED Summary, Intake/Output, MAR, Accordion, Procedure Verification and Quality Measures.

## 2017-05-02 NOTE — Progress Notes (Signed)
Primary Nurse Shirleen Schirmerori L Roberts and Phineas SemenAshton, RN performed a dual skin assessment on this patient No impairment noted  Braden score is 22

## 2017-05-02 NOTE — Progress Notes (Signed)
05/02/2017  11:39 AM  Reason for Admission:   Diarrhea                   RRAT Score:          12           Plan for utilizing home health:      TBD                    Likelihood of Readmission:  Green                         Transition of Care Plan:                    CM met with pt for assessment. Demographics were confirmed. Pt is a 47 year old, African American male who lives in a private residence alone (0 steps). PTA, pt was able to complete ADLs with the use of constant home O2 3-4 L continuous. Pt is unaware of home O2 provider. Pt has no prescription drug coverage, and prefers [facility]. Care Card application provided. No discharge service needs indicated at this time. CM will continue to monitor for finalized discharge recommendations.     Janeal Holmes, MA    Care Management Interventions  PCP Verified by CM: Yes(No PCP. Pt uses urgent care or ER. )  Palliative Care Criteria Met (RRAT>21 & CHF Dx)?: No  Mode of Transport at Discharge: Other (see comment)(Family)  MyChart Signup: No  Discharge Durable Medical Equipment: No  Physical Therapy Consult: No  Occupational Therapy Consult: No  Speech Therapy Consult: No  Current Support Network: Lives Alone  Confirm Follow Up Transport: Family  Plan discussed with Pt/Family/Caregiver: Yes  Discharge Location  Discharge Placement: Unable to determine at this time

## 2017-05-02 NOTE — Progress Notes (Signed)
Patient has refused lab draw unless we pull it from one is his fistulas.  Education was provided. Patient did not change his mind.

## 2017-05-02 NOTE — Progress Notes (Signed)
Called by the RN. Apparently Mr. David [name] would like to leave AMA. Spoke with him about this. Explained that due to his C.diff and PNA, without treatment he could become ill and even die. He was able to re-iterate this to me and understands the ramifications of his decision making. He states that he is in pain. I stated that he has IV dilaudid that he can receive every four hours and he can have a dose when his next dose is due. He subsequently told the RN that I said he could have an extra dose. This is not the case. 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 If he leaves AMA, security will be notified due to this. He may have his dose of dilaudid at its next scheduled time.

## 2017-05-02 NOTE — Progress Notes (Signed)
Bedside and Verbal shift change report given to [name], [name] (oncoming nurse) by Jodelle Grossori Roberts,RN (offgoing nurse). Report included the following information SBAR, Kardex, ED Summary, Intake/Output, MAR and Accordion.

## 2017-05-02 NOTE — Progress Notes (Signed)
Pt has been calling every 3-4 hours for PRN Duo Nebs. Pt states he is SOB but RR=16 and unlabored, O2 sat=98% and BS=clear or slightly diminished.

## 2017-05-02 NOTE — Progress Notes (Signed)
Walked in patients room. Patient was standing looking out of the window. Patient had disconnected himself from his IV pump. Antibiotics were infusing when nurse last left the room. Patient states he disconnected himself because "it" was finished. He states there was nothing running but Normal Saline. Nurse advised the patient that his antibiotic was mixed with the bag of Normal Saline and that he should not have disconnected the pump without inquiring with the nurse first. Patient was hooked back up to the pump and asked not to touch the pump again. He was asked to call the nurse if the pump started beeping. Patient verbalized understanding.

## 2017-05-02 NOTE — Consults (Signed)
See PN

## 2017-05-02 NOTE — Progress Notes (Signed)
Correct name verified as David [name]   [birth year]  [address]  [name] [id number]  Permission to combine charts states "i didn't want to go through the hassle of my name and stuff, didn't feel well"     Therapist, [employer] [employer] and [employer] Charlie

## 2017-05-02 NOTE — Progress Notes (Addendum)
[place] [facility]  [address] [place], [place] [address]  [phone]    Medical Progress Note      NAME: David [name]   DOB:  12/01/1969  MRM:  [id number]    Date/Time: 05/02/2017  12:59 PM       Subjective:     Chief Complaint:  F/u diarrhea    Called to bedside this morning due to complaints of chest pain. Pt notes chest pressure, feels like an elephant. He is refusing EKG, troponin. He notes no improvement with SL nitro.           Objective:       Vitals:          Last 24hrs VS reviewed since prior progress note. Most recent are:    Visit Vitals  BP 160/90 (BP 1 Location: Right arm, BP Patient Position: Sitting;Post activity)   Pulse 81   Temp 98.3 ??F (36.8 ??C)   Resp 19   Ht 6\' 1"  (1.854 m)   Wt 111.1 kg (245 lb)   SpO2 98%   BMI 32.32 kg/m??     SpO2 Readings from Last 6 Encounters:   05/02/17 98%   04/21/17 99%   04/21/17 100%    O2 Flow Rate (L/min): 4 l/min       Intake/Output Summary (Last 24 hours) at 05/02/2017 1259  Last data filed at 05/02/2017 0530  Gross per 24 hour   Intake 560 ml   Output ???   Net 560 ml          Exam:     Physical Exam:    Gen:  Well-developed, well-nourished, in no acute distress  HEENT:  Pink conjunctivae, PERRL, hearing intact to voice, moist mucous membranes  Neck:  Supple, without masses, thyroid non-tender  Resp:  No accessory muscle use, clear breath sounds without wheezes rales or rhonchi  Card:  No murmurs, normal S1, S2 without thrills, bruits or peripheral edema  Abd:  Soft, non-tender, non-distended, normoactive bowel sounds are present  Musc:  No cyanosis or clubbing  Skin:  No rashes or ulcers, skin turgor is good  Neuro:  No focal deficits, follows commands appropriately  Psych:  Good insight, oriented to person, place and time, alert      Medications Reviewed: (see below)    Lab Data Reviewed: (see below)    ______________________________________________________________________    Medications:     Current Facility-Administered Medications    Medication Dose Route Frequency   ??? albuterol-ipratropium (DUO-NEB) 2.5 MG-0.5 MG/3 ML  3 mL Nebulization Q4H PRN   ??? nitroglycerin (NITROSTAT) tablet 0.4 mg  0.4 mg SubLINGual PRN   ??? epoetin alfa (EPOGEN;PROCRIT) injection 10,000 Units  10,000 Units IntraVENous DIALYSIS MON, WED & FRI   ??? vancomycin (FIRVANQ) 50 mg/mL oral solution 125 mg  125 mg Oral Q6H   ??? piperacillin-tazobactam (ZOSYN) 3.375 g in 0.9% sodium chloride (MBP/ADV) 100 mL  3.375 g IntraVENous Q12H   ??? amLODIPine (NORVASC) tablet 10 mg  10 mg Oral DAILY   ??? carvedilol (COREG) tablet 12.5 mg  12.5 mg Oral BID WITH MEALS   ??? hydrALAZINE (APRESOLINE) tablet 100 mg  100 mg Oral TID   ??? furosemide (LASIX) tablet 80 mg  80 mg Oral DAILY   ??? hydrALAZINE (APRESOLINE) 20 mg/mL injection 20 mg  20 mg IntraVENous Q6H PRN   ??? HYDROmorphone (PF) (DILAUDID) injection 1 mg  1 mg IntraVENous Q4H PRN   ??? sevelamer carbonate (  RENVELA) tab 800 mg  800 mg Oral TID WITH MEALS            Lab Review:     Recent Labs     05/01/17  1613   WBC 7.7   HGB 8.6*   HCT 28.2*   PLT 117*     Recent Labs     05/01/17  1613   NA 144   K 4.5   CL 105   CO2 24   GLU 104*   BUN 60*   CREA 8.76*   CA 5.8*   MG 1.6   ALB 2.5*   SGOT 22   ALT 21     No components found for: GLPOC         Assessment / Plan:     Diarrhea: persistent and concerning for recurrent C difficile infection. Check C diff. Consult GI. Continue oral vanc. Awaiting GI eval; fecal transplant is under consideration  ??  ESRD on dialysis [facility](HCC) (04/21/2017): usually on HD on MWFs. Consult nephrology. Continue Sevelamaer  ??  Abnormal CXR POA: concerning for pneumonia. Recently in hospital. Start IV Zosyn.   ??  Chest pain / CAD (coronary artery disease): pt is refusing lab work and repeat EKG. Continue Coreg. Start asa. Order prn nitro. LDL 40 on recent lab work. Cardiology to review  ??  Ascites / Pulmonary hypertension): has been reviewed by hepatology who attributed this to a passive hepatic congestion from a combination of  ESRD, biventicular CHF, pulmonary HTN and TR. GI to review    ??  Anemia due to chronic kidney disease (05/01/2017): monitor Hgb and transfuse as needed      Total time spent with patient: 7536 Minutes                  Care Plan discussed with: Patient and Nursing Staff    Discussed:  Care Plan    Prophylaxis:  Lovenox    Disposition:  Home w/Family           ___________________________________________________    Attending Physician: Worthy RancherBronwyn E Cyana Shook, MD

## 2017-05-02 NOTE — Progress Notes (Signed)
[place] [facility]    Audley Hose  Date of Birth: 12/01/1969      Assessment & Plan:   ESRD on HD MWF  Anemia of CKD  HTN, controlled  Cdiff  CP  Hyperphosphatremia  Edema    Rec:  HD today and MWF. Orders written. [facility] notified.  EPO with HD. Check iron  Continue sevlamer  Continue home BP meds  UF with HD       Subjective:   CC: ESRD mgmt  HPI: Patient of [facility] with ESRD on HD MWF. Admitted with diarrhea/cdiff. Recently left [facility] AMA.  Has anemia and gets EPO with HD. On sevelamer for hyperphos.  ROS: no sob/n/v  Current Facility-Administered Medications   Medication Dose Route Frequency   ??? albuterol-ipratropium (DUO-NEB) 2.5 MG-0.5 MG/3 ML  3 mL Nebulization Q4H PRN   ??? nitroglycerin (NITROSTAT) tablet 0.4 mg  0.4 mg SubLINGual PRN   ??? epoetin alfa (EPOGEN;PROCRIT) injection 10,000 Units  10,000 Units IntraVENous DIALYSIS MON, WED & FRI   ??? vancomycin (FIRVANQ) 50 mg/mL oral solution 125 mg  125 mg Oral Q6H   ??? piperacillin-tazobactam (ZOSYN) 3.375 g in 0.9% sodium chloride (MBP/ADV) 100 mL  3.375 g IntraVENous Q12H   ??? amLODIPine (NORVASC) tablet 10 mg  10 mg Oral DAILY   ??? carvedilol (COREG) tablet 12.5 mg  12.5 mg Oral BID WITH MEALS   ??? hydrALAZINE (APRESOLINE) tablet 100 mg  100 mg Oral TID   ??? furosemide (LASIX) tablet 80 mg  80 mg Oral DAILY   ??? hydrALAZINE (APRESOLINE) 20 mg/mL injection 20 mg  20 mg IntraVENous Q6H PRN   ??? HYDROmorphone (PF) (DILAUDID) injection 1 mg  1 mg IntraVENous Q4H PRN   ??? sevelamer carbonate (RENVELA) tab 800 mg  800 mg Oral TID WITH MEALS          Objective:     Vitals:  Blood pressure 160/90, pulse 81, temperature 98.3 ??F (36.8 ??C), resp. rate 19, height 6\' 1"  (1.854 m), weight 111.1 kg (245 lb), SpO2 98 %.  Temp (24hrs), Avg:98.1 ??F (36.7 ??C), Min:97.6 ??F (36.4 ??C), Max:98.5 ??F (36.9 ??C)      Intake and Output:  No intake/output data recorded.  01/12 1901 - 01/14 0700  In: 560 [P.O.:360; I.V.:200]  Out: -      Physical Exam:               GENERAL ASSESSMENT: NAD  NECK: LIJ PC  CHEST: CTA  HEART: S1S2  ABDOMEN: Soft,NT,  EXTREMITY: 2+ EDEMA  NEURO: Grossly non focal          ECG/rhythm:    Data Review      No results for input(s): TNIPOC in the last 72 hours.    No lab exists for component: ITNL   Recent Labs     05/01/17  1613   TROIQ 0.11*     Recent Labs     05/01/17  1613   NA 144   K 4.5   CL 105   CO2 24   BUN 60*   CREA 8.76*   GLU 104*   MG 1.6   CA 5.8*   ALB 2.5*   WBC 7.7   HGB 8.6*   HCT 28.2*   PLT 117*      No results for input(s): INR, PTP, APTT in the last 72 hours.    No lab exists for component: INREXT  Needs: urine analysis, urine sodium, protein and  creatinine  No results found for: NAU, CREAU          : Latricia Heftaran G Nannette Zill, MD  05/02/2017        [facility]:  [email or web address]  [email or web address]  [place] office:  [address]  [place], [place] [address]  Phone: [phone]  Fax :     [phone]    [facility] office:  [address]  [place], [place] [id number]  Phone - [phone]  Fax - [phone]

## 2017-05-02 NOTE — Progress Notes (Signed)
Contacted Dr. Rubbie BattiestBridge on patient status for update via tiger text.

## 2017-05-02 NOTE — Progress Notes (Signed)
Problem: Falls - Risk of  Goal: *Absence of Falls  Document Schmid Fall Risk and appropriate interventions in the flowsheet.  Outcome: Progressing Towards Goal  Fall Risk Interventions:            Medication Interventions: Teach patient to arise slowly

## 2017-05-02 NOTE — Consults (Signed)
Cardiology Consultation Note                               David LericheMark A. Xzander Gilham, MD, [facility]                                         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 He is not able to take ACE-I from angioedema. Maximize med's.     2) CAD  -will do lexiscan Cardiolite  -Aspirin  -risk factor  modification    3) ESRD    4) Ascites    5) Pulmonary HTN    6) QTc prolongation  -avoid drugs that cause QTc prolongation      H/H 8.6/28, Plt 117, Na 144, K 4.5, BUN/Cre 60/8.76, Trop 0.11 and pBNP 35K  ECG QTc 500 ms           David [name] is a 47 y.o. male I am seeing for chest discomfort and reduced EF. [facility] sx's of watery diarrhea and signed himself out AMA. He has had a productive cough and SOB. He had CXR with ground glass opacity in the right lung base. He states he has been experiencing chest discomfort with radiation to the left arm and jaw. He had MI in 2010 and two stents. [place] is with sister now. He has been to [name] and now [facility]. He has been treated for infected dialysis line, and pneumonia and has been on multiple antibiotics. His EF by Echo is 35-40% and unclear if this is old or new. He states he has not seen a cardiologist in a while. He has been on dialysis for 7 years.      Allergies   Allergen Reactions   ??? Lisinopril Shortness of Breath and Angioedema   ??? Morphine Shortness of Breath and Angioedema   ??? Toradol [Ketorolac] Shortness of Breath and Angioedema    ??? Tramadol Shortness of Breath         Past Medical History:   Diagnosis Date   ??? Chronic kidney disease    ??? HTN (hypertension)    ??? MI (myocardial infarction) (HCC) 2010        Past Surgical History:   Procedure Laterality Date   ??? ABDOMEN SURGERY PROC UNLISTED      umbilical hernia and right groin hernia 2016   ??? HX CORONARY STENT PLACEMENT  2010        .Home Medications:  Prior to Admission Medications   Prescriptions Last Dose Informant Patient Reported? Taking?   albuterol (PROVENTIL HFA, VENTOLIN HFA, PROAIR HFA) 90 mcg/actuation inhaler 04/30/2017 at AM  Yes Yes   Sig: Take 1 Puff  by inhalation every four (4) hours as needed for Wheezing or Shortness of Breath.   amLODIPine (NORVASC) 10 mg tablet 04/30/2017 at AM  Yes Yes   Sig: Take 10 mg by mouth daily.   carvedilol (COREG) 12.5 mg tablet 04/30/2017 at AM  Yes Yes   Sig: Take 12.5 mg by mouth two (2) times daily (with meals).   furosemide (LASIX) 80 mg tablet 04/30/2017 at AM  Yes Yes   Sig: Take 80 mg by mouth daily.   hydrALAZINE (APRESOLINE) 100 mg tablet 04/30/2017 at AM  Yes Yes   Sig: Take 100 mg by mouth three (3) times daily.   sevelamer (RENAGEL) 800 mg tablet 04/30/2017 at AM  Yes Yes   Sig: Take 800 mg by mouth three (3) times daily (with meals).      Facility-Administered Medications: None       Hospital Medications:  Current Facility-Administered Medications   Medication Dose Route Frequency   ??? albuterol-ipratropium (DUO-NEB) 2.5 MG-0.5 MG/3 ML  3 mL Nebulization Q4H PRN   ??? nitroglycerin (NITROSTAT) tablet 0.4 mg  0.4 mg SubLINGual PRN   ??? vancomycin (FIRVANQ) 50 mg/mL oral solution 125 mg  125 mg Oral Q6H   ??? piperacillin-tazobactam (ZOSYN) 3.375 g in 0.9% sodium chloride (MBP/ADV) 100 mL  3.375 g IntraVENous Q12H   ??? amLODIPine (NORVASC) tablet 10 mg  10 mg Oral DAILY   ??? carvedilol (COREG) tablet 12.5 mg  12.5 mg Oral BID WITH MEALS   ??? hydrALAZINE (APRESOLINE) tablet 100 mg  100 mg Oral TID    ??? furosemide (LASIX) tablet 80 mg  80 mg Oral DAILY   ??? hydrALAZINE (APRESOLINE) 20 mg/mL injection 20 mg  20 mg IntraVENous Q6H PRN   ??? HYDROmorphone (PF) (DILAUDID) injection 1 mg  1 mg IntraVENous Q4H PRN   ??? sevelamer carbonate (RENVELA) tab 800 mg  800 mg Oral TID WITH MEALS          OBJECTIVE     Laboratory and Imaging have been reviewed and are notable for      ECG:  Date: sinus tachycardia with LAE with QTC of 500 ms      Diagnostic Tests:     Recent Labs     05/01/17  1613   TROIQ 0.11*     Recent Labs     05/01/17  1613   NA 144   K 4.5   CO2 24   BUN 60*   CREA 8.76*   GLU 104*   MG 1.6   WBC 7.7   HGB 8.6*   HCT 28.2*   PLT 117*         Cardiac work up to date:  1) Echo  (04/21/17):Left ventricle EF 35-40% diffuse hypokinesis decreased RV fxn, LAE, RAE, mild TR, small PE  2)Cholesterol  (04/21/17): TC 136, HDL 64, LDL 47, TG 125               Social History:  Social History     Tobacco Use   ??? Smoking status: Never Smoker   ??? Smokeless tobacco: Never Used   Substance Use Topics   ??? Alcohol use: No     Frequency: Never       Family History:  No family history on file.    Review of Symptoms:  A comprehensive review of systems was negative except for that written in the HPI.    Physical Exam:      Visit Vitals  BP 160/90 (  BP 1 Location: Right arm, BP Patient Position: Sitting;Post activity)   Pulse 81   Temp 98.3 ??F (36.8 ??C)   Resp 19   Ht 6\' 1"  (1.854 m)   Wt 245 lb (111.1 kg)   SpO2 98%   BMI 32.32 kg/m??     General Appearance:  Well developed, well nourished,alert and oriented x 3, and individual in no acute distress.cooperative and good hisotian   Ears/Nose/Mouth/Throat:   Hearing grossly normal.Normal oral mucosa,no scleral icterus     Neck: Supple no JVD or bruits,no cervical lymphadenopathy   Chest:   Lungs rhonchi anterior on left   Cardiovascular:  Regular rate and rhythm,   Abdomen:   Soft, non-tender, bowel sounds are active. No abdominal bruits    Extremities: + edema bilaterally. Pulses detected, no varicosities   Skin: Warm and dry. No bruising  Neuro  Moves all extermities and neurologically intact                                                       I have discussed the diagnosis with the patient and the intended plan as seen in the above orders.  Questions were answered concerning future plans.  I have discussed medication side effects and warnings with the patient as well.    David [name] is in agreement to the plan listed above and wishes to proceed.     he  was instructed not to smoke, eat heart healthy diet  and to exercise.     Thank you for this consult.      Rushie Chestnut, MD

## 2017-05-02 NOTE — Consults (Signed)
Consults by Mariea Stable, NP at 05/02/17 1408                Author: Mariea Stable, NP  Service: Nurse Practitioner  Author Type: Nurse Practitioner       Filed: 05/02/17 1446  Date of Service: 05/02/17 1408  Status: Attested Addendum          Editor: Mariea Stable, NP (Nurse Practitioner)       Related Notes: Original Note by Mariea Stable, NP (Nurse Practitioner) filed at 05/02/17 1445          Cosigner: Arman Filter, MD at 05/05/17 1741            Consult Orders        1. IP CONSULT TO GASTROENTEROLOGY [[id number]] ordered by Melynda Keller, MD at 05/01/17 1944                         Attestation signed by Arman Filter, MD at 05/05/17 [phone]          Patient seen and examined, agree with NP Eleana Tocco's note and plan. Reviewed chart from recent admission at [facility] and plans for fecal transplant, this can  be achieved here once respiratory status has improved. Paracentesis for symptom relief can be performed prior to colonoscopy later this week.                                    [place] [facility]   Mariea Stable NP   [phone]                       GASTROENTEROLOGY CONSULTATION NOTE                      NAME:  David [name]    DOB:   12/01/1969    MRN:   [id number]          Referring Physician:    Dr. Lorette Ang        Consult Date:    05/02/2017 2:08 PM      Chief Complaint:     Diarrhea       History of Present Illness:     Patient is a 47 y.o. who we were asked to see in consultation for the above complaint.  The patient has a history of several recurrences of C Diff diarrhea.  The diarrhea started again 4 days ago.   He had been having up to 7 BMs per day.  His symptoms have been associated with nausea, vomiting, fevers and abdominal pain.  He describes the abdominal pain sharp,radiating across the middle of his abomen and it is made worse with bowel movements.  He  denies recent antibiotic use.         He also has a history of refractory ascites.  He has had prior work up for this which  includes a negative liver biopsy.  He is currently followed by Dr. Larose Kells.  He has been having paracentesis done every 2 weeks.  It was last done about 2 weeks ago.      PMH:     Past Medical History:        Diagnosis  Date         ?  Chronic kidney disease       ?  HTN (hypertension)           ?  MI (myocardial infarction) (HCC)  2010           PSH:     Past Surgical History:         Procedure  Laterality  Date          ?  ABDOMEN SURGERY PROC UNLISTED              umbilical hernia and right groin hernia 2016          ?  HX CORONARY STENT PLACEMENT    2010           Allergies:     Allergies        Allergen  Reactions         ?  Lisinopril  Shortness of Breath and Angioedema     ?  Morphine  Shortness of Breath and Angioedema     ?  Toradol [Ketorolac]  Shortness of Breath and Angioedema         ?  Tramadol  Shortness of Breath           Home Medications:     Prior to Admission Medications     Prescriptions  Last Dose  Informant  Patient Reported?  Taking?      albuterol (PROVENTIL HFA, VENTOLIN HFA, PROAIR HFA) 90 mcg/actuation inhaler  04/30/2017 at AM    Yes  Yes      Sig: Take 1 Puff by inhalation every four (4) hours as needed for Wheezing or Shortness of Breath.      amLODIPine (NORVASC) 10 mg tablet  04/30/2017 at AM    Yes  Yes      Sig: Take 10 mg by mouth daily.      carvedilol (COREG) 12.5 mg tablet  04/30/2017 at AM    Yes  Yes      Sig: Take 12.5 mg by mouth two (2) times daily (with meals).      furosemide (LASIX) 80 mg tablet  04/30/2017 at AM    Yes  Yes      Sig: Take 80 mg by mouth daily.      hydrALAZINE (APRESOLINE) 100 mg tablet  04/30/2017 at AM    Yes  Yes      Sig: Take 100 mg by mouth three (3) times daily.      sevelamer (RENAGEL) 800 mg tablet  04/30/2017 at AM    Yes  Yes      Sig: Take 800 mg by mouth three (3) times daily (with meals).               Facility-Administered Medications: None           Hospital Medications:     Current Facility-Administered Medications          Medication  Dose   Route  Frequency           ?  albuterol-ipratropium (DUO-NEB) 2.5 MG-0.5 MG/3 ML   3 mL  Nebulization  Q4H PRN     ?  nitroglycerin (NITROSTAT) tablet 0.4 mg   0.4 mg  SubLINGual  PRN     ?  epoetin alfa (EPOGEN;PROCRIT) injection 10,000 Units   10,000 Units  IntraVENous  DIALYSIS MON, WED & FRI           ?  [START ON 05/03/2017] aspirin chewable tablet 81 mg   81 mg  Oral  DAILY           ?  vancomycin (FIRVANQ) 50 mg/mL oral  solution 125 mg   125 mg  Oral  Q6H     ?  piperacillin-tazobactam (ZOSYN) 3.375 g in 0.9% sodium chloride (MBP/ADV) 100 mL   3.375 g  IntraVENous  Q12H     ?  amLODIPine (NORVASC) tablet 10 mg   10 mg  Oral  DAILY     ?  carvedilol (COREG) tablet 12.5 mg   12.5 mg  Oral  BID WITH MEALS     ?  hydrALAZINE (APRESOLINE) tablet 100 mg   100 mg  Oral  TID     ?  furosemide (LASIX) tablet 80 mg   80 mg  Oral  DAILY     ?  hydrALAZINE (APRESOLINE) 20 mg/mL injection 20 mg   20 mg  IntraVENous  Q6H PRN     ?  HYDROmorphone (PF) (DILAUDID) injection 1 mg   1 mg  IntraVENous  Q4H PRN           ?  sevelamer carbonate (RENVELA) tab 800 mg   800 mg  Oral  TID WITH MEALS           Social History:     Social History          Tobacco Use         ?  Smoking status:  Never Smoker     ?  Smokeless tobacco:  Never Used       Substance Use Topics         ?  Alcohol use:  No              Frequency:  Never           Family History:   No family history on file.      Review of Systems:   Constitutional: Positive fever, negative chills, negative weight loss   Eyes:   negative visual changes   ENT:   negative sore throat, tongue or lip swelling   Respiratory:  negative cough, negative dyspnea   Cards:  negative for chest pain, palpitations, lower extremity edema   GI:   See HPI   GU:  negative for frequency, dysuria   Integument:  negative for rash and pruritus   Heme:  negative for easy bruising and gum/nose bleeding   Musculoskel: negative for myalgias,  back pain and muscle weakness   Neuro: negative for headaches,  dizziness, vertigo   Psych:  negative for feelings of anxiety, depression         Objective:        Patient Vitals for the past 8 hrs:        Pulse        05/02/17 0703  81        No intake/output data recorded.   01/12 1901 - 01/14 0700   In: 560 [P.O.:360; I.V.:200]   Out: -       EXAM:      NEURO-alert, oriented x3, affect appropriate    HEENT-Head: Normocephalic, no lesions, without obvious abnormality.    LUNGS-clear to auscultation bilaterally     COR-regular rate and rhythym      ABD- Firm, Not TTP. Bowel sounds normal. No masses,  no organomegaly      EXT-no edema     Skin - No rash       Data Review         Recent Labs           05/01/17   1613  WBC  7.7     HGB  8.6*     HCT  28.2*        PLT  117*          Recent Labs           05/01/17   1613     NA  144     K  4.5     CL  105     CO2  24     BUN  60*     CREA  8.76*     GLU  104*        CA  5.8*          Recent Labs           05/01/17   1613     SGOT  22     AP  244*     TP  6.6     ALB  2.5*     GLOB  4.1*        LPSE  238        No results for input(s): INR, PTP, APTT in the last 72 hours.      No lab exists for component: INREXT        Patient Active Problem List        Diagnosis  Code         ?  Chest pain  R07.9     ?  ESRD on dialysis (HCC)  N18.6, Z99.2     ?  Ascites  R18.8     ?  C. difficile colitis  A04.72     ?  Thrombocytopenia (HCC)  D69.6     ?  Anemia  D64.9     ?  Pulmonary hypertension (HCC)  I27.20     ?  Biventricular CHF (congestive heart failure) (HCC)  I50.82     ?  Tricuspid regurgitation  I07.1     ?  Diarrhea  R19.7     ?  Anemia due to chronic kidney disease  N18.9, D63.1         ?  CAD (coronary artery disease)  I25.10           Assessment and Plan:   Diarrhea:  Likely recurrence of C Diff.  Will follow results of stool studies which are pending collection.   - Continue Oral Vancomycin   - Started probiotic.   - Diet as tolerated.   - Monitor Labs.   - Fecal transplant later this week after his PNA has improved.       Ascites:  Refractory.  Unclear etiology.   - US paracentesis with fluid analysis tomorrow.   - Continue Diuretics.            Thanks for allowing me to participate in the care of this patient.      Signed By:  Mariea Stable, NP           05/02/2017  2:08 PM

## 2017-05-02 NOTE — Consults (Signed)
Cardiology Consultation Note                                 David Leriche A. Elleigh Cassetta, MD, [facility]                                           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 He is not able to take ACE-I from angioedema. Maximize med's.     2) CAD  -will do lexiscan Cardiolite  -Aspirin  -risk factor  modification    3) ESRD    4) Ascites    5) Pulmonary HTN    6) QTc prolongation  -avoid drugs that cause QTc prolongation      H/H 8.6/28, Plt 117, Na 144, K 4.5, BUN/Cre 60/8.76, Trop 0.11 and pBNP 35K  ECG QTc 500 ms           David [name] is a 47 y.o. male I am seeing for chest discomfort and reduced EF. He apparently was at [facility] for similar sx's of watery diarrhea and signed himself out AMA. He has had a productive cough and SOB. He had CXR with ground glass opacity in the right lung base. He states he has been experiencing chest discomfort with radiation to the left arm and jaw. He had MI in 2010 and two stents. Apparently he lives in [place] and is with sister now. He has been to [name] and now [facility]. He has been treated for infected dialysis line, and pneumonia and has been on multiple antibiotics. His EF by Echo is 35-40% and unclear if this is old or new. He states he has not seen a cardiologist in a while. He has been on dialysis for 7 years.      Allergies   Allergen Reactions   ??? Lisinopril Shortness of Breath and Angioedema   ??? Morphine Shortness of Breath and Angioedema   ??? Toradol [Ketorolac] Shortness of Breath and Angioedema   ??? Tramadol  Shortness of Breath         Past Medical History:   Diagnosis Date   ??? Chronic kidney disease    ??? HTN (hypertension)    ??? MI (myocardial infarction) (HCC) 2010        Past Surgical History:   Procedure Laterality Date   ??? ABDOMEN SURGERY PROC UNLISTED      umbilical hernia and right groin hernia 2016   ??? HX CORONARY STENT PLACEMENT  2010        .Home Medications:  Prior to Admission Medications   Prescriptions Last Dose Informant Patient Reported? Taking?   albuterol (PROVENTIL HFA, VENTOLIN HFA, PROAIR HFA) 90 mcg/actuation inhaler 04/30/2017 at AM  Yes Yes  Sig: Take 1 Puff by inhalation every four (4) hours as needed for Wheezing or Shortness of Breath.   amLODIPine (NORVASC) 10 mg tablet 04/30/2017 at AM  Yes Yes   Sig: Take 10 mg by mouth daily.   carvedilol (COREG) 12.5 mg tablet 04/30/2017 at AM  Yes Yes   Sig: Take 12.5 mg by mouth two (2) times daily (with meals).   furosemide (LASIX) 80 mg tablet 04/30/2017 at AM  Yes Yes   Sig: Take 80 mg by mouth daily.   hydrALAZINE (APRESOLINE) 100 mg tablet 04/30/2017 at AM  Yes Yes   Sig: Take 100 mg by mouth three (3) times daily.   sevelamer (RENAGEL) 800 mg tablet 04/30/2017 at AM  Yes Yes   Sig: Take 800 mg by mouth three (3) times daily (with meals).      Facility-Administered Medications: None       Hospital Medications:  Current Facility-Administered Medications   Medication Dose Route Frequency   ??? albuterol-ipratropium (DUO-NEB) 2.5 MG-0.5 MG/3 ML  3 mL Nebulization Q4H PRN   ??? nitroglycerin (NITROSTAT) tablet 0.4 mg  0.4 mg SubLINGual PRN   ??? vancomycin (FIRVANQ) 50 mg/mL oral solution 125 mg  125 mg Oral Q6H   ??? piperacillin-tazobactam (ZOSYN) 3.375 g in 0.9% sodium chloride (MBP/ADV) 100 mL  3.375 g IntraVENous Q12H   ??? amLODIPine (NORVASC) tablet 10 mg  10 mg Oral DAILY   ??? carvedilol (COREG) tablet 12.5 mg  12.5 mg Oral BID WITH MEALS   ??? hydrALAZINE (APRESOLINE) tablet 100 mg  100 mg Oral TID   ??? furosemide (LASIX) tablet 80 mg  80 mg Oral DAILY   ???  hydrALAZINE (APRESOLINE) 20 mg/mL injection 20 mg  20 mg IntraVENous Q6H PRN   ??? HYDROmorphone (PF) (DILAUDID) injection 1 mg  1 mg IntraVENous Q4H PRN   ??? sevelamer carbonate (RENVELA) tab 800 mg  800 mg Oral TID WITH MEALS          OBJECTIVE       Laboratory and Imaging have been reviewed and are notable for      ECG:  Date: sinus tachycardia with LAE with QTC of 500 ms      Diagnostic Tests:     Recent Labs     05/01/17  1613   TROIQ 0.11*     Recent Labs     05/01/17  1613   NA 144   K 4.5   CO2 24   BUN 60*   CREA 8.76*   GLU 104*   MG 1.6   WBC 7.7   HGB 8.6*   HCT 28.2*   PLT 117*         Cardiac work up to date:  1) Echo  (04/21/17):Left ventricle EF 35-40% diffuse hypokinesis decreased RV fxn, LAE, RAE, mild TR, small PE  2)Cholesterol  (04/21/17): TC 136, HDL 64, LDL 47, TG 125               Social History:  Social History     Tobacco Use   ??? Smoking status: Never Smoker   ??? Smokeless tobacco: Never Used   Substance Use Topics   ??? Alcohol use: No     Frequency: Never       Family History:  No family history on file.    Review of Symptoms:  A comprehensive review of systems was negative except for that written in the HPI.    Physical Exam:  Visit Vitals  BP 160/90 (BP 1 Location: Right arm, BP Patient Position: Sitting;Post activity)   Pulse 81   Temp 98.3 ??F (36.8 ??C)   Resp 19   Ht 6\' 1"  (1.854 m)   Wt 245 lb (111.1 kg)   SpO2 98%   BMI 32.32 kg/m??     General Appearance:  Well developed, well nourished,alert and oriented x 3, and individual in no acute distress.cooperative and good hisotian   Ears/Nose/Mouth/Throat:   Hearing grossly normal.Normal oral mucosa,no scleral icterus     Neck: Supple no JVD or bruits,no cervical lymphadenopathy   Chest:   Lungs rhonchi anterior on left   Cardiovascular:  Regular rate and rhythm,   Abdomen:   Soft, non-tender, bowel sounds are active. No abdominal bruits   Extremities: + edema bilaterally. Pulses detected, no varicosities   Skin: Warm and dry. No  bruising  Neuro  Moves all extermities and neurologically intact                                                       I have discussed the diagnosis with the patient and the intended plan as seen in the above orders.  Questions were answered concerning future plans.  I have discussed medication side effects and warnings with the patient as well.    David [name] is in agreement to the plan listed above and wishes to proceed.     he  was instructed not to smoke, eat heart healthy diet  and to exercise.     Thank you for this consult.      Rushie Chestnut, MD

## 2017-05-03 ENCOUNTER — Inpatient Hospital Stay: Payer: MEDICARE

## 2017-05-03 LAB — IRON PROFILE
Iron % saturation: 77 % — ABNORMAL HIGH (ref 20–50)
Iron: 153 ug/dL — ABNORMAL HIGH (ref 35–150)
TIBC: 200 ug/dL — ABNORMAL LOW (ref 250–450)

## 2017-05-03 MED ORDER — LIDOCAINE (PF) 10 MG/ML (1 %) IJ SOLN
10 mg/mL (1 %) | Freq: Once | INTRAMUSCULAR | Status: DC
Start: 2017-05-03 — End: 2017-05-03
  Administered 2017-05-03: 14:00:00 via SUBCUTANEOUS

## 2017-05-03 MED FILL — IPRATROPIUM-ALBUTEROL 2.5 MG-0.5 MG/3 ML NEB SOLUTION: 2.5 mg-0.5 mg/3 ml | RESPIRATORY_TRACT | Qty: 3

## 2017-05-03 MED FILL — SEVELAMER CARBONATE 800 MG TAB: 800 mg | ORAL | Qty: 1

## 2017-05-03 MED FILL — HYDROMORPHONE (PF) 2 MG/ML IJ SOLN: 2 mg/mL | INTRAMUSCULAR | Qty: 1

## 2017-05-03 MED FILL — HYDRALAZINE 20 MG/ML IJ SOLN: 20 mg/mL | INTRAMUSCULAR | Qty: 1

## 2017-05-03 MED FILL — AMLODIPINE 5 MG TAB: 5 mg | ORAL | Qty: 2

## 2017-05-03 MED FILL — PIPERACILLIN-TAZOBACTAM 3.375 GRAM IV SOLR: 3.375 gram | INTRAVENOUS | Qty: 3.38

## 2017-05-03 MED FILL — RISAQUAD 8 BILLION CELL CAPSULE: 8 billion cell | ORAL | Qty: 1

## 2017-05-03 MED FILL — VANCOMYCIN 50 MG/ML ORAL SOLUTION: 50 mg/mL | ORAL | Qty: 2.5

## 2017-05-03 MED FILL — LIDOCAINE (PF) 10 MG/ML (1 %) IJ SOLN: 10 mg/mL (1 %) | INTRAMUSCULAR | Qty: 10

## 2017-05-03 MED FILL — ASPIRIN 81 MG CHEWABLE TAB: 81 mg | ORAL | Qty: 1

## 2017-05-03 MED FILL — CARVEDILOL 6.25 MG TAB: 6.25 mg | ORAL | Qty: 2

## 2017-05-03 MED FILL — HYDRALAZINE 25 MG TAB: 25 mg | ORAL | Qty: 4

## 2017-05-03 MED FILL — FUROSEMIDE 40 MG TAB: 40 mg | ORAL | Qty: 2

## 2017-05-03 NOTE — Progress Notes (Signed)
[facility] and to get ETA Nurse on day shift was informed by On Call that someone would arrive between 9pm and 12am to perform hemodialysis on patient. No one has arrived as of yet. Informed that someone would give me a call back.

## 2017-05-03 NOTE — Progress Notes (Signed)
05/03/2017  3:34 PM  CM informed pt is leaving AMA. No DC service needs indicated prior or currently.

## 2017-05-03 NOTE — Progress Notes (Signed)
Rad RN sent for Pt to be transported for Paracentesis.  Transporter reported back that Pt is refusing all tests.  Pt is currently receiving dialysis per transporter.  Rad RN called and spoke to assigned RN and she confirmed that Pt is refusing all tests.  Rad RN advised that she will also notify Nuc Med staff as Pt also an order for a stress test as well and department plan was for Pt to go from US to Nuc Med.  Rad RN requested that assigned RN notify both departments if Pt agrees to any testing as ordered.

## 2017-05-03 NOTE — Progress Notes (Signed)
Contacted again by nursing that patient wishes to leave against medical advice. While the patient still requires inpatient care, he does not meet the threshold for initiating a medical TDO (10 % mortality within 24 hours). As he is not a threat to himself and is of sound mind, I reviewed the risks of leaving (worsening condition), the benefits of staying (further testing to define illness and improve medical condition). He elected to leave against medical advice. This was then withdrawn approx.3 mins later but I informed him that we would be de-escalating his narcotics (doesn't take this equivalent at home) and he again, elected to leave against medical advice.      Of note, patient last received IV dilaudid at 1407. He is adamant that he drive himself home. He has refused a cab voucher home. We have informed him that it is unsafe for him to drive until 6 hours after last IV dosing. We will attempt to accommodate him in an alternative arrangement (discharge lounge). I have informed the patient that if he leaves before that time, authorities would be alerted that he is driving under the influence.    Krysti Hickling discussed extensively with 4th floor RNs, charge, floor supervisor, patient, and consultants.    Discussion above was witnessed by S. Powers RN.

## 2017-05-03 NOTE — Progress Notes (Signed)
Courtesy:  HD yesterday -3kg  Next HD tomorrow

## 2017-05-03 NOTE — Progress Notes (Signed)
Cardiology Progress Note      4th floor NAME:  David [name]   DOB:   12/01/1969   MRN:   [id number]     Assessment/Plan:   1. Chest pain/ICM with LVEF 35-40% : refusing lexiscan today. No ACE-/ARB 2/2 angioedema  2. CAD: says he will proceed tomorrow with stress test. Cont asa   3. ESRD: HD per renal  4. Ascites: for paracentesis today  5. Pa HTN  6. QTc prolonged: avoid QTc prolonging meds        Subjective:   HPI:  David [name] is a 47 y.o. male we are seeing for chest discomfort and reduced EF. He apparently was at [facility] for similar sx's of watery diarrhea and signed himself out AMA. He has had a productive cough and SOB. He had CXR with ground glass opacity in the right lung base. He states he has been experiencing chest discomfort with radiation to the left arm and jaw. He had MI in 2010 and two stents. Apparently he lives in [place] and is with sister now. He has been to [facility], [name] and now [facility]. He has been treated for infected dialysis line, and pneumonia and has been on multiple antibiotics. His EF by Echo is 35-40% and unclear if this is old or new. He states he has not seen a cardiologist in a while. He has been on dialysis for 7 years.                Cardiac ROS: Still has persistent dull cp. Patient denies any  dyspnea, palpitations, syncope, orthopnea, edema or paroxysmal nocturnal dyspnea.    Previous Cardiac Eval  No specialty comments available.      Review of Systems: + abd pain. NPO for testing. Reports continued loose stools.          Objective:     Visit Vitals  BP (!) 178/113   Pulse (!) 112   Temp 98.1 ??F (36.7 ??C) (Oral)   Resp 18   Ht 6\' 1"  (1.854 m)   Wt 245 lb (111.1 kg)   SpO2 96%   BMI 32.32 kg/m??    O2 Flow Rate (L/min): 4 l/min O2 Device: Nasal cannula    Temp (24hrs), Avg:98.2 ??F (36.8 ??C), Min:98 ??F (36.7 ??C), Max:98.6 ??F (37 ??C)      No intake/output data recorded.    01/13 1901 - 01/15 0700  In: 1170 [P.O.:970; I.V.:200]  Out: 0   TELE: ST     General: AAOx3 cooperative, no acute distress.  HEENT: Atraumatic. Pink and moist.  Anicteric sclerae.  Neck : Supple  Lungs: CTA bilaterally.   Heart: Regular rhythm, no murmur, no rubs, no gallops. No JVD. No carotid bruits.   Abdomen: Soft, non-distended, non-tender. + Bowel sounds.   Extremities: Dry, flaky skin No edema, no clubbing, no cyanosis. No calf tenderness  Neurologic: Grossly intact.  Alert and oriented X 3.  No acute neurological distress.   Psych: Fair insight. Not anxious or agitated.      Care Plan discussed with:    Comments   Patient x    Family      RN x    Care Manager                    Consultant:  x        Data Review:     No lab exists for component: ITNL   Recent Labs     05/01/17  1613   TROIQ 0.11*     Recent Labs     05/01/17  1613   NA 144   K 4.5   CL 105   CO2 24   BUN 60*   CREA 8.76*   GLU 104*   MG 1.6   ALB 2.5*   WBC 7.7   HGB 8.6*   HCT 28.2*   PLT 117*     No results for input(s): INR, PTP, APTT in the last 72 hours.    No lab exists for component: INREXT    Medications reviewed  Current Facility-Administered Medications   Medication Dose Route Frequency   ??? lidocaine (PF) (XYLOCAINE) 10 mg/mL (1 %) injection 10 mL  10 mL SubCUTAneous RAD ONCE   ??? albuterol-ipratropium (DUO-NEB) 2.5 MG-0.5 MG/3 ML  3 mL Nebulization Q4H PRN   ??? nitroglycerin (NITROSTAT) tablet 0.4 mg  0.4 mg SubLINGual PRN   ??? epoetin alfa (EPOGEN;PROCRIT) injection 10,000 Units  10,000 Units IntraVENous DIALYSIS MON, WED & FRI   ??? aspirin chewable tablet 81 mg  81 mg Oral DAILY   ??? lactobac ac& pc-s.therm-b.anim (FLORA Q/RISAQUAD)  1 Cap Oral DAILY   ??? vancomycin (FIRVANQ) 50 mg/mL oral solution 125 mg  125 mg Oral Q6H   ??? piperacillin-tazobactam (ZOSYN) 3.375 g in 0.9% sodium chloride (MBP/ADV) 100 mL  3.375 g IntraVENous Q12H   ??? amLODIPine (NORVASC) tablet 10 mg  10 mg Oral DAILY   ??? carvedilol (COREG) tablet 12.5 mg  12.5 mg Oral BID WITH MEALS    ??? hydrALAZINE (APRESOLINE) tablet 100 mg  100 mg Oral TID   ??? furosemide (LASIX) tablet 80 mg  80 mg Oral DAILY   ??? hydrALAZINE (APRESOLINE) 20 mg/mL injection 20 mg  20 mg IntraVENous Q6H PRN   ??? HYDROmorphone (PF) (DILAUDID) injection 1 mg  1 mg IntraVENous Q4H PRN   ??? sevelamer carbonate (RENVELA) tab 800 mg  800 mg Oral TID WITH MEALS         Vincent PeyerStephanie A Endi Lagman, NP

## 2017-05-03 NOTE — Progress Notes (Signed)
Rad RN on the floor discussing plan for the ordered paracentesis in US.  US has availability this afternoon but Pt advised "it's too much to do dialysis and this all in one day."  Pt also advised that his stomach hurts.  Rad RN informed Pt that we will plan for paracentesis in the morning.  Pt confirmed that he is "100% sure" he does not want the procedure today but is ok with having it tomorrow.  Lelon MastSamantha, RN and Karyl KinnierWehunt, MD informed of the aforementioned.

## 2017-05-03 NOTE — Progress Notes (Signed)
Informed by primary RN, Kandra NicolasSamantha Powers, that patient wishes to leave AMA.  Discussed with patient - primary concern described as "I just want to go back to [name]."  Informed that there are still medical procedures scheduled for tomorrow.  Pt is adamant that he wants to leave today.  Pt requesting to drive himself home.  Educated patient that our policy does not allow patients to drive home after receiving IV narcotics without physician approval or an appropriate amount of time passing per pharmacy. Offered a cab voucher for the trip home. Pt refused cab voucher. "My truck is here, I'm driving myself home." Educated the patient that while he is free to leave, security will be contacted if he attempts to drive home under the influence. Pt states "You are holding me here against my will." Again reinforced that while we are not holding the patient, there are consequences of him driving home at this time. Pt states he will stay until 8pm (6hr wait period) and drive home at that time. "I'll leave at 8pm."

## 2017-05-03 NOTE — Discharge Summary (Signed)
Physician Discharge Summary     Patient ID:  David [name]  [id number]  47 y.o.  Jun 03, 1970    Admit date: 05/01/2017    Discharge date and time: 05/04/2017    Admission Diagnoses: Diarrhea    Discharge Diagnoses:    Principal Diagnosis   Diarrhea                                             Other Diagnoses  Principal Problem:    Diarrhea (05/01/2017)    Active Problems:    ESRD on dialysis (HCC) (04/21/2017)      Ascites (04/21/2017)      Thrombocytopenia (HCC) (04/21/2017)      Pulmonary hypertension (HCC) (04/21/2017)      Anemia due to chronic kidney disease (05/01/2017)      CAD (coronary artery disease) (05/01/2017)         Hospital Course:     David [name] is a 47 yo M with a hx of ESRD on HD who presented for diarrhea. [facility] and left AMA. On his admission her, he remained in the hospital for 2 days before leaving AMA without planned paracentesis and lexiscan.     PCP: None    Consults: Cardiology, GI and Nephrology    Significant Diagnostic Studies: See Hospital Course    Discharged home in improved condition.    Discharge Exam:    Last 24hrs VS reviewed since prior progress note. Most recent are:  ??  Visit Vitals  BP 155/74 (BP 1 Location: Right arm, BP Patient Position: At rest)   Pulse (!) 111   Temp 98.4 ??F (36.9 ??C)   Resp 18   Ht 6\' 1"  (1.854 m)   Wt 111.1 kg (245 lb)   SpO2 95%   BMI 32.32 kg/m??   ??      SpO2 Readings from Last 6 Encounters:   05/03/17 95%   04/21/17 99%   04/21/17 100%   ?? O2 Flow Rate (L/min): 4 l/min   ??  ??  Intake/Output Summary (Last 24 hours) at 05/03/2017 1206  Last data filed at 05/03/2017 0919      Gross per 24 hour   Intake 610 ml   Output 3000 ml   Net -2390 ml      ??  ??  Exam:   ??  Physical Exam:  ??  Gen:  Well-developed, well-nourished, in no acute distress  HEENT:  Pink conjunctivae, PERRL, hearing intact to voice, moist mucous membranes  Neck:  Supple, without masses, thyroid non-tender   Resp:  No accessory muscle use, clear breath sounds without wheezes rales or rhonchi  Card:  No murmurs, normal S1, S2 without thrills, bruits or peripheral edema  Abd:  Soft, non-tender, non-distended, normoactive bowel sounds are present  Musc:  No cyanosis or clubbing  Skin:  No rashes or ulcers, skin turgor is good  Neuro:  No focal deficits, follows commands appropriately  Psych:  Good insight, oriented to person, place and time, alert          Disposition:  Left AMA    Patient Instructions:   Cannot display discharge medications since this patient is not currently admitted.    Signed:  Duane Bostonasey Rexanna Louthan Jr V, DO  05/04/2017  3:43 PM    Greater than 30 mins was spent in coordination,  counseling, and execution of this patient's discharge

## 2017-05-03 NOTE — Progress Notes (Signed)
Patient sitting on side of the bed nodding. Nurse asked the patient to get in bed to prevent patient from falling while sleeping. Patient refused. Stating " I'm fine."

## 2017-05-03 NOTE — Other (Signed)
[facility]  [phone]    Vitals   Pre   Post   Assessment   Pre   Post     Temp  Temp: 98.1 ??F (36.7 ??C) (05/03/17 0549)  98.0 LOC  Alert/ oriented x4 No change   HR   Pulse (Heart Rate): 96 (05/03/17 0549) 111 Lungs   Diminished/ NC 2lpm No change   B/P   BP: (!) 166/111 (05/03/17 0549) 145/71 Cardiac   Remotely monitored No change   Resp   Resp Rate: 18 (05/03/17 0549) 18 Skin   Warm/ dry No change    Pain level  Pain Intensity 1: 10 (05/03/17 0551) 0/10 Edema    generalized   No change   Orders:    Duration:   Start:    0549 End:    0920 Total:   3.5hrs   Dialyzer:   Dialyzer/Set Up Inspection: Revaclear (05/03/17 0549)   Kirtland BouchardK Bath:   Dialysate K (mEq/L): 2 (05/03/17 0549)   Ca Bath:   Dialysate CA (mEq/L): 2.5 (05/03/17 0549)   Na/Bicarb:   Dialysate NA (mEq/L): 140 (05/03/17 0549)   Target Fluid Removal:   Goal/Amount of Fluid to Remove (mL): 3000 mL (05/03/17 0549)   Access     Type & Location:   Left subclavian cvc, (+) aspiration/flush, pump speed 400   Labs     Obtained/Reviewed   Critical Results Called   Date when labs were drawn-  Hgb-    HGB   Date Value Ref Range Status   05/01/2017 8.6 (L) 12.1 - 17.0 g/dL Final     K-    Potassium   Date Value Ref Range Status   05/01/2017 4.5 3.5 - 5.1 mmol/L Final     Ca-   Calcium   Date Value Ref Range Status   05/01/2017 5.8 (LL) 8.5 - 10.1 MG/DL Final     Comment:     RESULTS VERIFIED, PHONED TO AND READ BACK BY  WILLIAMS RN AT 1707/JS       Bun-   BUN   Date Value Ref Range Status   05/01/2017 60 (H) 6 - 20 MG/DL Final     Creat-   Creatinine   Date Value Ref Range Status   05/01/2017 8.76 (H) 0.70 - 1.30 MG/DL Final        Medications/ Blood Products Given     Name   Dose   Route and Time     NONE                Blood Volume Processed (BVP):    77.8 Net Fluid   Removed:  3000ml   Comments   Time Out Done: [id number]  Primary Nurse Rpt Pre: P. Rice, RN  Primary Nurse Rpt Post: S. Powers, RN   Pt Education: Hospital dialysis procedures  Care Plan: Continue treatment as ordered  Tx Summary: Patient tolerated treatment well. All possible blood returned with normal saline rinse back. CVC ports locked with normal saline and capped  Admiting Diagnosis: GI issues  Pt's previous clinic- NONE  Consent signed - Obtained  [facility] Consent - Obtained  Hepatitis Status- Drawn  Machine #- Machine Number: [id number] (05/03/17 [id number])  Telemetry status- REMOTELY  Pre-dialysis wt.- Pre-Dialysis Weight: 111.1 kg (244 lb 14.9 oz) (05/03/17 0549)

## 2017-05-03 NOTE — Progress Notes (Signed)
Problem: Falls - Risk of  Goal: *Absence of Falls  Document Schmid Fall Risk and appropriate interventions in the flowsheet.  Outcome: Progressing Towards Goal  Fall Risk Interventions:  Mobility Interventions: PT Consult for mobility concerns, Assess mobility with egress test, Utilize gait belt for transfers/ambulation         Medication Interventions: Patient to call before getting OOB

## 2017-05-03 NOTE — Progress Notes (Signed)
Rec call back from Dialysis nurse who states that she is still coming to dialyze the patient.

## 2017-05-03 NOTE — Progress Notes (Signed)
[place] [facility]  [phone]           GI PROGRESS NOTE      NAME: David Limboroy Higley   DOB:  1970-06-20   MRN:  [id number]       Subjective:   Continues to have abdominal pain, no bowel movements today.  Feels short of breath from coughing.      Objective:       VITALS:   Last 24hrs VS reviewed since prior progress note. Most recent are:  Visit Vitals  BP 155/74 (BP 1 Location: Right arm, BP Patient Position: At rest)   Pulse (!) 111   Temp 98.4 ??F (36.9 ??C)   Resp 18   Ht 6\' 1"  (1.854 m)   Wt 111.1 kg (245 lb)   SpO2 95%   BMI 32.32 kg/m??       Intake/Output Summary (Last 24 hours) at 05/03/2017 1504  Last data filed at 05/03/2017 0919  Gross per 24 hour   Intake 1090 ml   Output 3000 ml   Net -1910 ml       PHYSICAL EXAM:  General: Alert, in no acute distress    HEENT: Anicteric sclerae.  Lungs:            Coarse breath sounds RLL.   Heart:  Regular  rhythm,    Abdomen: Firm, moderately distended, Generalized tenderness to palpation.  (+)Bowel sounds.  Extremities: No c/c/e  Neurologic:  CN 2-12 gi, Alert and oriented X 3.  No acute neurological distress   Psych:   Good insight. Not anxious nor agitated.    Lab Data Reviewed:   Recent Labs     05/01/17  1613   WBC 7.7   HGB 8.6*   HCT 28.2*   PLT 117*     Recent Labs     05/01/17  1613   NA 144   K 4.5   CL 105   CO2 24   BUN 60*   CREA 8.76*   GLU 104*   CA 5.8*     Recent Labs     05/01/17  1613   SGOT 22   AP 244*   TP 6.6   ALB 2.5*   GLOB 4.1*   LPSE 238       ________________________________________________________________________  Patient Active Problem List   Diagnosis Code   ??? Chest pain R07.9   ??? 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 Ascites R18.8   ??? C. difficile colitis A04.72   ??? Thrombocytopenia (HCC) D69.6   ??? Anemia D64.9   ??? Pulmonary hypertension (HCC) I27.20   ??? 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 Tricuspid regurgitation I07.1   ??? Diarrhea R19.7    ??? Anemia due to chronic kidney disease N18.9, D63.1   ??? CAD (coronary artery disease) I25.10         Assessment and Plan:  Diarrhea:  Likely C Diff.  Stool studies pending collection.  - Continue Oral Vanc and Probiotic  - Continue diet as tolerated.  - Monitor labs.  - Fecal transplant once PNA has improved.    Ascites:    - US Paracentesis with fluid analysis pending.  - Continue Lasix.    Will continue to follow.         Signed By: Mariea Stableim Inita Uram, NP     05/03/2017  3:04 PM

## 2017-05-03 NOTE — Progress Notes (Signed)
Patient requesting to speak with the nursing supervisor regarding his NPO status.  He states he needs ginger ale on ice to help clear his sinuses.  He stated that he is NPO for a stress test and he is refusing the stress test.  Patient given 1 cup of diet ginger ale with ice per his request.

## 2017-05-03 NOTE — Progress Notes (Signed)
[place] [facility]  [address] [place], [place] [address]  [phone]    Medical Progress Note      NAME: David [name]   DOB:  12/01/1969  MRM:  [id number]    Date/Time: 05/03/2017  12:59 PM       Subjective:     Chief Complaint:  F/u diarrhea    Patient seen and examined. Chart reviewed. Patient had refused all labs, stress test, and paracentesis this AM. Upon discussion with multiple providers, primary problem is with dialysis starting and finishing at very late hours. He is agreeable to all testing rescheduled for tomorrow. He continues to have diarrhea as we await a stool sample.           Objective:       Vitals:          Last 24hrs VS reviewed since prior progress note. Most recent are:    Visit Vitals  BP 155/74 (BP 1 Location: Right arm, BP Patient Position: At rest)   Pulse (!) 111   Temp 98.4 ??F (36.9 ??C)   Resp 18   Ht 6\' 1"  (1.854 m)   Wt 111.1 kg (245 lb)   SpO2 95%   BMI 32.32 kg/m??     SpO2 Readings from Last 6 Encounters:   05/03/17 95%   04/21/17 99%   04/21/17 100%    O2 Flow Rate (L/min): 4 l/min       Intake/Output Summary (Last 24 hours) at 05/03/2017 1206  Last data filed at 05/03/2017 0919  Gross per 24 hour   Intake 610 ml   Output 3000 ml   Net -2390 ml          Exam:     Physical Exam:    Gen:  Well-developed, well-nourished, in no acute distress  HEENT:  Pink conjunctivae, PERRL, hearing intact to voice, moist mucous membranes  Neck:  Supple, without masses, thyroid non-tender  Resp:  No accessory muscle use, clear breath sounds without wheezes rales or rhonchi  Card:  No murmurs, normal S1, S2 without thrills, bruits or peripheral edema  Abd:  Soft, non-tender, non-distended, normoactive bowel sounds are present  Musc:  No cyanosis or clubbing  Skin:  No rashes or ulcers, skin turgor is good  Neuro:  No focal deficits, follows commands appropriately  Psych:  Good insight, oriented to person, place and time, alert      Medications Reviewed: (see below)     Lab Data Reviewed: (see below)    ______________________________________________________________________    Medications:     Current Facility-Administered Medications   Medication Dose Route Frequency   ??? lidocaine (PF) (XYLOCAINE) 10 mg/mL (1 %) injection 10 mL  10 mL SubCUTAneous RAD ONCE   ??? albuterol-ipratropium (DUO-NEB) 2.5 MG-0.5 MG/3 ML  3 mL Nebulization Q4H PRN   ??? nitroglycerin (NITROSTAT) tablet 0.4 mg  0.4 mg SubLINGual PRN   ??? epoetin alfa (EPOGEN;PROCRIT) injection 10,000 Units  10,000 Units IntraVENous DIALYSIS MON, WED & FRI   ??? aspirin chewable tablet 81 mg  81 mg Oral DAILY   ??? lactobac ac& pc-s.therm-b.anim (FLORA Q/RISAQUAD)  1 Cap Oral DAILY   ??? vancomycin (FIRVANQ) 50 mg/mL oral solution 125 mg  125 mg Oral Q6H   ??? piperacillin-tazobactam (ZOSYN) 3.375 g in 0.9% sodium chloride (MBP/ADV) 100 mL  3.375 g IntraVENous Q12H   ??? amLODIPine (NORVASC) tablet 10 mg  10 mg Oral DAILY   ??? carvedilol (COREG) tablet 12.5  mg  12.5 mg Oral BID WITH MEALS   ??? hydrALAZINE (APRESOLINE) tablet 100 mg  100 mg Oral TID   ??? furosemide (LASIX) tablet 80 mg  80 mg Oral DAILY   ??? hydrALAZINE (APRESOLINE) 20 mg/mL injection 20 mg  20 mg IntraVENous Q6H PRN   ??? HYDROmorphone (PF) (DILAUDID) injection 1 mg  1 mg IntraVENous Q4H PRN   ??? 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 Awaiting stool sample for C.diff. Appreciate GI input. Continue oral vanc. Fecal transplant is under consideration  ??  ESRD on dialysis [facility](HCC) (04/21/2017): usually on HD on MWFs. Nephrology assistance appreciated.. Continue Sevelamaer   ??  Abnormal CXR POA: concerning for pneumonia. Recently in hospital. Coarse cough continues. Continue IV zosyn for now, wean to levofloxacin as diarrhea improves. Get sputum cx if able.   ??  Chest pain / CAD (coronary artery disease): Continue asa. Order prn nitro. LDL 40 on recent lab work. Cardiology to take for lexiscan tomorrow (refused today)  ??  Ascites / Pulmonary hypertension): has been reviewed by hepatology who attributed this to a passive hepatic congestion from a combination of ESRD, biventicular CHF, pulmonary HTN and TR. GI to review    Anemia due to chronic kidney disease (05/01/2017): monitor Hgb and transfuse as needed      Total time spent with patient: 1736 Minutes                  Care Plan discussed with: Patient and Nursing Staff    Discussed:  Care Plan    Prophylaxis:  Lovenox    Disposition:  Home w/Family           ___________________________________________________    Attending Physician: Duane Bostonasey Shone Leventhal Jr V, DO

## 2017-05-03 NOTE — Progress Notes (Signed)
Bedside and Verbal shift change report given to Samantha, RN (oncoming nurse) by Koriana Stepien, RN (offgoing nurse). Report included the following information SBAR, Kardex, Intake/Output, MAR and Recent Results.

## 2017-05-03 NOTE — Progress Notes (Addendum)
Shift Summary    0715  Bedside and Verbal shift change report given to Kandra NicolasSamantha Powers RN (oncoming nurse) by Alisa GraffPatricia RN (offgoing nurse). Report included the following information SBAR, Kardex, MAR, Recent Results and Cardiac Rhythm  .       Patient receiving dialysis at this time. BP elevated as alerted by Dialysis RN. PRN IV hydralazine given. Upon assessment patient is sleeping. After patient was woken up he stated his pain was 10/10.  Pain medication was just administered ~ 0600 AM.  Education provided.     Patient requesting breakfast. Patient educated regarding NPO and plan for tests today. Patient states he is not having the test and he would like to eat breakfast.     Dr. Karyl KinnierWehunt notified.       Transport arrived to get patient to take to ultrasound, however patient is continuing to get dialysis at this time.  Ultrasound is aware.     Unable to collect cdiff r/t patient is not having any bowl movement,    Patient refusing paracentesis for this afternoon. MD notified.     1405  Requested sputum culture from patient. Cup given and labeled.       Nursing director and clinical lead notified of patient requesting to leave AMA. Evening medications refused.  MD at bedside. IV removed.     Patient insisted on driving home. Patient currently receiving IV narcotics. Cab was offered and refused.     Patient to discharge lounge to await safe time for patient to leave following IV dilaudid.

## 2017-05-03 NOTE — Discharge Summary (Signed)
Physician Discharge Summary     Patient ID:  David [name]  [id number]  46 y.o.  1970/12/15    Admit date: 05/01/2017    Discharge date and time: 05/04/2017    Admission Diagnoses: Diarrhea    Discharge Diagnoses:    Principal Diagnosis   Diarrhea                                             Other Diagnoses  Principal Problem:    Diarrhea (05/01/2017)    Active Problems:    ESRD on dialysis (HCC) (04/21/2017)      Ascites (04/21/2017)      Thrombocytopenia (HCC) (04/21/2017)      Pulmonary hypertension (HCC) (04/21/2017)      Anemia due to chronic kidney disease (05/01/2017)      CAD (coronary artery disease) (05/01/2017)         Hospital Course:     David [name] is a 47 yo M with a hx of ESRD on HD who presented for diarrhea. He had [facility] and left AMA. On his admission her, he remained in the hospital for 2 days before leaving AMA without planned paracentesis and lexiscan.     PCP: None    Consults: Cardiology, GI and Nephrology    Significant Diagnostic Studies: See Hospital Course    Discharged home in improved condition.    Discharge Exam:    Last 24hrs VS reviewed since prior progress note. Most recent are:  ??  Visit Vitals  BP 155/74 (BP 1 Location: Right arm, BP Patient Position: At rest)   Pulse (!) 111   Temp 98.4 ??F (36.9 ??C)   Resp 18   Ht 6\' 1"  (1.854 m)   Wt 111.1 kg (245 lb)   SpO2 95%   BMI 32.32 kg/m??   ??      SpO2 Readings from Last 6 Encounters:   05/03/17 95%   04/21/17 99%   04/21/17 100%   ?? O2 Flow Rate (L/min): 4 l/min   ??  ??  Intake/Output Summary (Last 24 hours) at 05/03/2017 1206  Last data filed at 05/03/2017 0919      Gross per 24 hour   Intake 610 ml   Output 3000 ml   Net -2390 ml      ??  ??  Exam:   ??  Physical Exam:  ??  Gen:  Well-developed, well-nourished, in no acute distress  HEENT:  Pink conjunctivae, PERRL, hearing intact to voice, moist mucous membranes  Neck:  Supple, without masses, thyroid non-tender  Resp:  No accessory muscle use, clear breath sounds without  wheezes rales or rhonchi  Card:  No murmurs, normal S1, S2 without thrills, bruits or peripheral edema  Abd:  Soft, non-tender, non-distended, normoactive bowel sounds are present  Musc:  No cyanosis or clubbing  Skin:  No rashes or ulcers, skin turgor is good  Neuro:  No focal deficits, follows commands appropriately  Psych:  Good insight, oriented to person, place and time, alert          Disposition:  Left AMA    Patient Instructions:   Cannot display discharge medications since this patient is not currently admitted.    Signed:  Duane Bostonasey Carli Lefevers Jr V, DO  05/04/2017  3:43 PM    Greater than 30 mins was spent in coordination,  counseling, and execution of this patient's discharge

## 2017-05-04 LAB — HEPATITIS BE AG: Hepatitis Be Antigen: NEGATIVE

## 2017-06-02 NOTE — Discharge Summary (Signed)
[facility]      Patient: [name]  Admission Date: 04/13/2017   DOB: 12/01/1969  Discharge Date: 04/14/2017   MRN: [id number]  Discharge Attending: Alease Medina MD   Referring Physician: Christa See, MD  PCP: Christa See, MD       DISCHARGE SUMMARY     Discharge Information   Discharge Diagnoses:  1.  C. difficile colitis  2. End stage renal disease on hemodialysis  3  Recurrent anginal chest pain with history of coronary artery disease    Discharge Medications:  Patient left [facility]        Hospital Course   History of Present Illness and Hospital Course (1 Days)     [name] is a 47 y.o. male with past medical history of coronary artery disease ESRD on hemodialysis and recurrent C. Difficile colitis who left the hospital AGAINST MEDICAL ADVICE this afternoon returned to the hospital again reporting not feeling any better and that he was unable to return home to [facility]. He feels that he was stupid in trying to leave AGAINST MEDICAL ADVICE and request to be admitted for further evaluation and workup.  He reports several bowel movements in the last 12 hours at worsening crampy abdominal pain. Reports poor appetite but no nausea vomiting. No fevers or chills.    After admission, he was kept NPO for possible stress test but he refused the test and then left AMA again.      Procedures/Imaging   No orders to display              Diagnostics     Labs/Studies Pending at Discharge: No    Last Labs         Invalid input(s): ADIFF, REFLX, CANCL, BAND, ABAND          Microbiology Results     None             Time spent examining patient, discussing with patient/family regarding hospital course, chart review, reconciling medications and discharge planning: 15 minutes.    Signed,  No att. providers found  06/02/2017 12:40 PM

## 2017-06-16 ENCOUNTER — Emergency Department: Admit: 2017-06-16 | Payer: MEDICARE

## 2017-06-16 ENCOUNTER — Inpatient Hospital Stay
Admit: 2017-06-16 | Discharge: 2017-06-16 | Discharge status: Against Medical Advice | Payer: MEDICARE | Attending: Family Medicine | Admitting: Family Medicine

## 2017-06-16 ENCOUNTER — Inpatient Hospital Stay

## 2017-06-16 DIAGNOSIS — R079 Chest pain, unspecified: Secondary | ICD-10-CM

## 2017-06-16 LAB — MAGNESIUM: Magnesium: 1.9 mg/dL (ref 1.6–2.6)

## 2017-06-16 LAB — NT-PRO BNP: NT pro-BNP: 96272 PG/ML — ABNORMAL HIGH (ref 0–450)

## 2017-06-16 LAB — CBC WITH AUTOMATED DIFF
ABS. BASOPHILS: 0 10*3/uL (ref 0.0–0.1)
ABS. EOSINOPHILS: 0.3 10*3/uL (ref 0.0–0.4)
ABS. LYMPHOCYTES: 0.5 10*3/uL — ABNORMAL LOW (ref 0.9–3.6)
ABS. MONOCYTES: 0.2 10*3/uL (ref 0.05–1.2)
ABS. NEUTROPHILS: 4.8 10*3/uL (ref 1.8–8.0)
BASOPHILS: 0 % (ref 0–2)
EOSINOPHILS: 6 % — ABNORMAL HIGH (ref 0–5)
HCT: 24.6 % — ABNORMAL LOW (ref 36.0–48.0)
HGB: 7.9 g/dL — ABNORMAL LOW (ref 13.0–16.0)
LYMPHOCYTES: 9 % — ABNORMAL LOW (ref 21–52)
MCH: 29.9 PG (ref 24.0–34.0)
MCHC: 32.1 g/dL (ref 31.0–37.0)
MCV: 93.2 FL (ref 74.0–97.0)
MONOCYTES: 4 % (ref 3–10)
MPV: 8.9 FL — ABNORMAL LOW (ref 9.2–11.8)
NEUTROPHILS: 81 % — ABNORMAL HIGH (ref 40–73)
PLATELET: 104 10*3/uL — ABNORMAL LOW (ref 135–420)
RBC: 2.64 M/uL — ABNORMAL LOW (ref 4.70–5.50)
RDW: 17.2 % — ABNORMAL HIGH (ref 11.6–14.5)
WBC: 5.8 10*3/uL (ref 4.6–13.2)

## 2017-06-16 LAB — CARDIAC PANEL,(CK, CKMB & TROPONIN)
CK - MB: 21.6 ng/ml — ABNORMAL HIGH (ref <3.6)
CK - MB: 18.5 ng/ml — ABNORMAL HIGH (ref <3.6)
CK-MB Index: 0.9 % (ref 0.0–4.0)
CK-MB Index: 0.8 % (ref 0.0–4.0)
CK: 2535 U/L — ABNORMAL HIGH (ref 39–308)
CK: 2228 U/L — ABNORMAL HIGH (ref 39–308)
Troponin-I, QT: 0.13 NG/ML — ABNORMAL HIGH (ref 0.0–0.045)
Troponin-I, QT: 0.17 NG/ML — ABNORMAL HIGH (ref 0.0–0.045)

## 2017-06-16 LAB — METABOLIC PANEL, COMPREHENSIVE
A-G Ratio: 0.8 (ref 0.8–1.7)
ALT (SGPT): 17 U/L (ref 16–61)
AST (SGOT): 35 U/L (ref 15–37)
Albumin: 2.9 g/dL — ABNORMAL LOW (ref 3.4–5.0)
Alk. phosphatase: 241 U/L — ABNORMAL HIGH (ref 45–117)
Anion gap: 11 mmol/L (ref 3.0–18)
BUN/Creatinine ratio: 5 — ABNORMAL LOW (ref 12–20)
BUN: 49 MG/DL — ABNORMAL HIGH (ref 7.0–18)
Bilirubin, total: 0.5 MG/DL (ref 0.2–1.0)
CO2: 27 mmol/L (ref 21–32)
Calcium: 6 MG/DL — ABNORMAL LOW (ref 8.5–10.1)
Chloride: 101 mmol/L (ref 100–108)
Creatinine: 9.17 MG/DL — ABNORMAL HIGH (ref 0.6–1.3)
GFR est AA: 7 mL/min/{1.73_m2} — ABNORMAL LOW (ref >60)
GFR est non-AA: 6 mL/min/{1.73_m2} — ABNORMAL LOW (ref >60)
Globulin: 3.6 g/dL (ref 2.0–4.0)
Glucose: 77 mg/dL (ref 74–99)
Potassium: 4.8 mmol/L (ref 3.5–5.5)
Protein, total: 6.5 g/dL (ref 6.4–8.2)
Sodium: 139 mmol/L (ref 136–145)

## 2017-06-16 LAB — POC LACTIC ACID: Lactic Acid (POC): 0.91 mmol/L (ref 0.40–2.00)

## 2017-06-16 LAB — LIPASE: Lipase: 408 U/L — ABNORMAL HIGH (ref 73–393)

## 2017-06-16 LAB — HEMOGLOBIN A1C WITH EAG
Est. average glucose: 108 mg/dL
Hemoglobin A1c: 5.4 % (ref 4.2–5.6)

## 2017-06-16 LAB — PROTHROMBIN TIME + INR
INR: 1.2 (ref 0.8–1.2)
Prothrombin time: 14.7 s (ref 11.5–15.2)

## 2017-06-16 LAB — PTT: aPTT: 35.4 s (ref 23.0–36.4)

## 2017-06-16 MED ORDER — MORPHINE 4 MG/ML INTRAVENOUS SOLUTION
4 mg/mL | INTRAVENOUS | Status: AC
Start: 2017-06-16 — End: 2017-06-16
  Administered 2017-06-16: 17:00:00 via INTRAVENOUS

## 2017-06-16 MED ORDER — SODIUM CHLORIDE 0.9 % IJ SYRG
INTRAMUSCULAR | Status: DC | PRN
Start: 2017-06-16 — End: 2017-06-16

## 2017-06-16 MED ORDER — NITROGLYCERIN 2 % TRANSDERMAL OINTMENT
2 % | TRANSDERMAL | Status: AC
Start: 2017-06-16 — End: 2017-06-16
  Administered 2017-06-16: 15:00:00 via TOPICAL

## 2017-06-16 MED ORDER — ONDANSETRON (PF) 4 MG/2 ML INJECTION
4 mg/2 mL | INTRAMUSCULAR | Status: AC
Start: 2017-06-16 — End: 2017-06-16
  Administered 2017-06-16: 17:00:00 via INTRAVENOUS

## 2017-06-16 MED ORDER — ASPIRIN 325 MG TAB
325 mg | ORAL | Status: AC
Start: 2017-06-16 — End: 2017-06-16
  Administered 2017-06-16: 18:00:00 via ORAL

## 2017-06-16 MED ORDER — FENTANYL CITRATE (PF) 50 MCG/ML IJ SOLN
50 mcg/mL | INTRAMUSCULAR | Status: AC
Start: 2017-06-16 — End: 2017-06-16
  Administered 2017-06-16: 15:00:00 via INTRAVENOUS

## 2017-06-16 MED ORDER — ONDANSETRON (PF) 4 MG/2 ML INJECTION
4 mg/2 mL | INTRAMUSCULAR | Status: AC
Start: 2017-06-16 — End: 2017-06-16
  Administered 2017-06-16: 15:00:00 via INTRAVENOUS

## 2017-06-16 MED ORDER — VANCOMYCIN ORAL SOLUTION 50 MG/ML CPD (RX COMPOUNDED)
50 mg/mL | Freq: Once | ORAL | Status: AC
Start: 2017-06-16 — End: 2017-06-16
  Administered 2017-06-16: 15:00:00 via ORAL

## 2017-06-16 MED FILL — MORPHINE 4 MG/ML INTRAVENOUS SOLUTION: 4 mg/mL | INTRAVENOUS | Qty: 1

## 2017-06-16 MED FILL — BD POSIFLUSH NORMAL SALINE 0.9 % INJECTION SYRINGE: INTRAMUSCULAR | Qty: 10

## 2017-06-16 MED FILL — NITRO-BID 2 % TRANSDERMAL OINTMENT: 2 % | TRANSDERMAL | Qty: 1

## 2017-06-16 MED FILL — FENTANYL CITRATE (PF) 50 MCG/ML IJ SOLN: 50 mcg/mL | INTRAMUSCULAR | Qty: 2

## 2017-06-16 MED FILL — ONDANSETRON (PF) 4 MG/2 ML INJECTION: 4 mg/2 mL | INTRAMUSCULAR | Qty: 2

## 2017-06-16 MED FILL — ASPIRIN 325 MG TAB: 325 mg | ORAL | Qty: 1

## 2017-06-16 MED FILL — VANCOMYCIN ORAL SOLUTION 50 MG/ML CPD (RX COMPOUNDED): 50 mg/mL | ORAL | Qty: 10

## 2017-06-16 NOTE — ED Notes (Signed)
TRANSFER - OUT REPORT:    Verbal report given to  [facility] (name) on Bernerd Limboroy Callender  being transferred to 349(unit) for routine progression of care       Report consisted of patient???s Situation, Background, Assessment and   Recommendations(SBAR).     Information from the following report(s) SBAR and ED Summary was reviewed with the receiving nurse.    Lines:   Peripheral IV 06/16/17 Right Other(comment) (Active)        Opportunity for questions and clarification was provided.      Patient transported with:   Registered Nurse

## 2017-06-16 NOTE — ED Triage Notes (Signed)
Patient reports diagnosed with C-diff on Friday and signed himself out of the hospital he was at. Patient also reports chest pain starting this am.

## 2017-06-16 NOTE — ED Notes (Signed)
Attempted to transport patient to CT at this time. Patient refusing transport until he receives pain medication. Pending venous access. ED MD aware.

## 2017-06-16 NOTE — ED Notes (Signed)
Patient called nurse to room complaining of 10/10 abdominal pain. Patient falling asleep and snoring during this RN's assessment.

## 2017-06-16 NOTE — ED Notes (Signed)
Nurse asked patient to leave stool sample. Patient states he is unable at this time and has not had the urge to pass stool since this morning Will re- attempted and a later time.

## 2017-06-16 NOTE — H&P (Signed)
History & Physical  Patient: David Limboroy Mccravy MRN: [id number]  CSN: [id number]    Date of Birth: 12/01/1968  Age: 47 y.o.  Sex: male      DOA: 06/16/2017  Primary Care Provider:  None      Assessment/Plan     Patient Active Problem List   Diagnosis Code   ??? Pericardial effusion I31.3   ??? Chest pain R07.9   ??? Pleural effusion J90   ??? Oxygen dependent Z99.81   ??? Elevated troponin R74.8   ??? Tobacco use Z72.0   ??? ESRD needing dialysis (HCC) N18.6, Z99.2   ??? Anasarca associated with disorder of kidney N04.9            HPI:     David [name] is a 47 y.o. male who has past history of CAD, stent placment, HTN, asthma, ESRD on home oxygen presents to ER with concerns of abdominal pain. Patient reports that he has abdominal for months, he also complains of chest pain. [place] in [place] and is visiting this area. He has history of C-diff multiple times and he has been having diarrhea for the past one week. He has chest pain for over a month and abdominal pain for long time. He was admitted at [facility] on May [facility], 2017. He was admitted for chest pain and elevated troponin, he was advised to get coronary angiogram, he refused and per discharge summary from [facility], patient signed out AMA as he wanted to get dilaudid and not morphine. He also has been using different names when he visits hospital. I discussed with patient and discussed the plan with him however he does not want to stay unless he gets dilaudid.     He is alert and oriented, and able to clearly report the risks of leaving the hospital w/o further intervention. This examiner had a long discussion with the patient at his bedside regarding the risks of leaving the hospital AMA and benefits of further treatment. The patient's decisional capacity is intact and clearly understands what this examiner is recommending and why. The patient is fully aware of the risks including, but not limited to the following: worsening symptoms, decline in  functional status, and even the possiblity of death. The patient declines the recommendations at this time. The patient clearly understands that he may return to the ED at any time for further evaluation and treatment. The patient is adamant of leaving the hospital AMA.              Past Medical History:   Diagnosis Date   ??? Asthma    ??? CAD (coronary artery disease)    ??? ESRD (end stage renal disease) (HCC)    ??? Hypertension        Past Surgical History:   Procedure Laterality Date   ??? HX CORONARY STENT PLACEMENT         History reviewed. No pertinent family history.    Social History     Socioeconomic History   ??? Marital status: SINGLE     Spouse name: Not on file   ??? Number of children: Not on file   ??? Years of education: Not on file   ??? Highest education level: Not on file   Tobacco Use   ??? Smoking status: Current Every Day Smoker   ??? Smokeless tobacco: Never Used   Substance and Sexual Activity   ??? Alcohol use: No     Frequency: Never   ??? Drug use: No  Prior to Admission medications    Medication Sig Start Date End Date Taking? Authorizing Provider   hydrALAZINE (APRESOLINE) 100 mg tablet Take  by mouth three (3) times daily.   Yes Other, Phys, MD   amLODIPine (NORVASC) 10 mg tablet Take 10 mg by mouth daily.   Yes Other, Phys, MD   carvedilol (COREG) 12.5 mg tablet Take 12.5 mg by mouth two (2) times daily (with meals).   Yes Other, Phys, MD   sod phos di, mono/K phos mono (PHOSPHOROUS PO) Take  by mouth.   Yes Other, Phys, MD   albuterol (PROVENTIL VENTOLIN) 2.5 mg /3 mL (0.083 %) nebulizer solution by Nebulization route once.   Yes Other, Phys, MD       Allergies   Allergen Reactions   ??? Lisinopril Other (comments)     "messes with my breathing"   ??? Toradol [Ketorolac] Other (comments)     "messes with my breathing"         Review of Systems  Gen: No fever, chills, malaise, weight loss/gain.   Heent: No headache, rhinorrhea, epistaxis, ear pain, hearing loss, sinus  pain, neck pain/stiffness, sore throat.   Heart: see above.   Resp: No cough, hemoptysis, wheezing and shortness of breath.   GI: see above   GU: No urinary obstruction, dysuria or hematuria.   Derm: No rash, new skin lesion or pruritis.   Musc/skeletal: no bone or joint complains.  Vasc: No cyanosis or claudication.   Endo: No heat/cold intolerance, no polyuria,polydipsia or polyphagia.   Neuro: No unilateral weakness, numbness, tingling. No seizures.   Heme: No easy bruising or bleeding.          Physical Exam:     Physical Exam:  Visit Vitals  BP 162/90   Pulse (!) 101   Temp 97.7 ??F (36.5 ??C)   Resp 20   Ht 6\' 1"  (1.854 m)   Wt 111.1 kg (245 lb)   SpO2 93%   BMI 32.32 kg/m??      O2 Device: Room air    Temp (24hrs), Avg:98.5 ??F (36.9 ??C), Min:97.7 ??F (36.5 ??C), Max:98.9 ??F (37.2 ??C)    No intake/output data recorded.   No intake/output data recorded.    General:  Awake, cooperative, no distress.   Head:  Normocephalic, without obvious abnormality, atraumatic.   Eyes:  Conjunctivae/corneas clear, sclera anicteric, PERRL, EOMs intact.   Nose: Nares normal. No drainage or sinus tenderness.   Throat: Lips, mucosa, and tongue normal.    Neck: Supple, symmetrical, trachea midline, no adenopathy.       Lungs:   Clear to auscultation bilaterally.       Heart:   S1, S2, no murmur, click, rub or gallop.     Abdomen: Soft, non-tender. Bowel sounds normal. No masses,  No organomegaly.   Extremities: Extremities normal, atraumatic, no cyanosis. + edema. Capillary refill normal.   Pulses: 2+ and symmetric all extremities.   Skin: Skin color pink, turgor normal. No rashes or lesions   Neurologic: CNII-XII intact. No focal motor or sensory deficit.       Labs Reviewed:    CMP:   Lab Results   Component Value Date/Time    NA 139 06/16/2017 09:30 AM    K 4.8 06/16/2017 09:30 AM    CL 101 06/16/2017 09:30 AM    CO2 27 06/16/2017 09:30 AM    AGAP 11 06/16/2017 09:30 AM    GLU 77 06/16/2017 09:30 AM  BUN 49 (H) 06/16/2017 09:30 AM    CREA 9.17 (H) 06/16/2017 09:30 AM    GFRAA 7 (L) 06/16/2017 09:30 AM    GFRNA 6 (L) 06/16/2017 09:30 AM    CA 6.0 (L) 06/16/2017 09:30 AM    MG 1.9 06/16/2017 09:30 AM    ALB 2.9 (L) 06/16/2017 09:30 AM    TP 6.5 06/16/2017 09:30 AM    GLOB 3.6 06/16/2017 09:30 AM    AGRAT 0.8 06/16/2017 09:30 AM    SGOT 35 06/16/2017 09:30 AM    ALT 17 06/16/2017 09:30 AM     CBC:   Lab Results   Component Value Date/Time    WBC 5.8 06/16/2017 09:30 AM    HGB 7.9 (L) 06/16/2017 09:30 AM    HCT 24.6 (L) 06/16/2017 09:30 AM    PLT 104 (L) 06/16/2017 09:30 AM     All Cardiac Markers in the last 24 hours:   Lab Results   Component Value Date/Time    CPK 2,228 (H) 06/16/2017 12:40 PM    CPK 2,535 (H) 06/16/2017 09:30 AM    CKMB 18.5 (H) 06/16/2017 12:40 PM    CKMB 21.6 (H) 06/16/2017 09:30 AM    CKND1 0.8 06/16/2017 12:40 PM    CKND1 0.9 06/16/2017 09:30 AM    TROIQ 0.17 (H) 06/16/2017 12:40 PM    TROIQ 0.13 (H) 06/16/2017 09:30 AM         Procedures/imaging: see electronic medical records for all procedures/Xrays and details which were not copied into this note but were reviewed prior to creation of Plan        CC: None

## 2017-06-16 NOTE — Other (Signed)
1 set of Blood cultures drawn and sent to lab.

## 2017-06-16 NOTE — ED Notes (Signed)
Patient has bilateral dialysis fistulas, no venous access available. RRT consulted for EJ. Dr. Donalee CitrinBecker examined the patient and determines it is appropriate for RRT to attempt EJ.  Signed By: Ellsworth LennoxJennifer A Kuiper     June 16, 2017

## 2017-06-16 NOTE — Discharge Summary (Signed)
David [name] is a 48 y.o. male who has past history of CAD, stent placment, HTN, asthma, ESRD on home oxygen presents to ER with concerns of abdominal pain. Patient reports that he has abdominal for months, he also complains of chest pain. He lives in [name] and is visiting this area. He has history of C-diff multiple times and he has been having diarrhea for the past one week. He has chest pain for over a month and abdominal pain for long time. He was admitted at [facility] on May 19, 2017. He was admitted for chest pain and elevated troponin, he was advised to get coronary angiogram, he refused and per discharge summary from [facility], patient signed out AMA as he wanted to get dilaudid and not morphine. He also has been using different names when he visits hospital. I discussed with patient and discussed the plan with him however he does not want to stay unless he gets dilaudid.     He is alert and oriented, and able to clearly report the risks of leaving the hospital w/o further intervention. This examiner had a long discussion with the patient at his bedside regarding the risks of leaving the hospital AMA and benefits of further treatment. The patient's decisional capacity is intact and clearly understands what this examiner is recommending and why. The patient is fully aware of the risks including, but not limited to the following: worsening symptoms, decline in functional status, and even the possiblity of death. The patient declines the recommendations at this time. The patient clearly understands that he may return to the ED at any time for further evaluation and treatment. The patient is adamant of leaving the hospital AMA.

## 2017-06-16 NOTE — ED Provider Notes (Signed)
EMERGENCY DEPARTMENT HISTORY AND PHYSICAL EXAM    Date: 06/16/2017  Patient Name: David [name]    History of Presenting Illness     Chief Complaint   Patient presents with   ??? Chest Pain   ??? Abdominal Pain         History Provided By: Patient    Chief Complaint: chest pain  Duration: starting this morning  Timing:  Acute  Location: chest  Associated Symptoms: nausea, vomiting, diarrhea, abdominal pain    Additional History (Context):   8:25 AM   Ladd Cen is a 47 y.o. male with PMHX of C. Diff, asthma, CKD, DM, HTN, MI, ascites, and Shx of cardiac stents (no blood thinner use) who is on dialysis and presents to the emergency department C/O nausea, vomiting, watery diarrhea, and abdominal pain (rated 10/10) which started 6 days ago, as well as a fever of 101 F yesterday, and chest pain starting this morning. Other symptoms include abdominal distention. The patient went to [facility] 6 days ago for his abdominal symptoms, and was evaluated by CT abdomen when he was diagnosed with C. Diff colitis. The patient signed himself out of the facility as he "didn't know anyone there", and came here. The patient was given prescriptions for Flagyl and Vancomycin, but has not been able to tolerate these medications and has no relief. The patient no longer makes urine and has dialysis on Monday, Wednesday, and Friday. Last dialysis session was yesterday, but he was unable to complete this due to nausea and vomiting. The patient also has regular paracentesis for his ascites, and he denies Hepatitis C or etoh use. Reports he was evaluated by liver biopsy which was "normal", and denies any known cause for his ascites. The patient has cardiac stents x2 secondary to MI but denies blood thinner use, reporting bleeding complications for his dialysis. Pt denies any other sxs or complaints.     PCP: None    Current Facility-Administered Medications   Medication Dose Route Frequency Provider Last Rate Last Dose    ??? sodium chloride (NS) flush 5-10 mL  5-10 mL IntraVENous PRN Keisa Blow, Leonel Ramsay, PA-C           Past History     Past Medical History:  Past Medical History:   Diagnosis Date   ??? Asthma    ??? Hypertension        Past Surgical History:  History reviewed. No pertinent surgical history.    Family History:  History reviewed. No pertinent family history.    Social History:  Social History     Tobacco Use   ??? Smoking status: Current Every Day Smoker   ??? Smokeless tobacco: Never Used   Substance Use Topics   ??? Alcohol use: No     Frequency: Never   ??? Drug use: No       Allergies:  Allergies   Allergen Reactions   ??? Lisinopril Other (comments)     "messes with my breathing"   ??? Toradol [Ketorolac] Other (comments)     "messes with my breathing"           Review of Systems   Review of Systems   Constitutional: Positive for fatigue and fever (101 F, yesterday). Negative for chills.   Respiratory: Positive for shortness of breath. Negative for cough and wheezing.    Cardiovascular: Positive for chest pain and leg swelling.   Gastrointestinal: Positive for abdominal distention, abdominal pain, diarrhea, nausea and vomiting.  Genitourinary:        No longer makes urine   Skin: Negative for color change.   Neurological: Positive for weakness (generalized). Negative for dizziness, light-headedness and headaches.   All other systems reviewed and are negative.      Physical Exam     Vitals:    06/16/17 1000 06/16/17 1047 06/16/17 1215 06/16/17 1348   BP: (!) 140/92 (!) 147/99 (!) 147/96 162/90   Pulse: (!) 108 (!) 105 (!) 105 (!) 101   Resp: _0 Temp:   98.8 ??F (37.1 ??C) 97.7 ??F (36.5 ??C)   SpO2: 96% 97% 100% 93%   Weight:       Height:         Physical Exam   Constitutional: He is oriented to person, place, and time. He appears well-developed and well-nourished. He appears ill.   AA male that appears ill. Alert. Answering questions. Following directions.    HENT:   Head: Normocephalic and atraumatic.    Right Ear: External ear normal.   Left Ear: External ear normal.   Nose: Nose normal.   Mouth/Throat: Uvula is midline and mucous membranes are normal.   Eyes: Conjunctivae are normal.   Neck: Normal range of motion.   Cardiovascular: Normal rate, regular rhythm, normal heart sounds and intact distal pulses. Exam reveals no gallop and no friction rub.   No murmur heard.  Pulmonary/Chest: Effort normal and breath sounds normal. No accessory muscle usage. No tachypnea. No respiratory distress. He has no decreased breath sounds. He has no wheezes. He has no rhonchi. He has no rales.       Abdominal: Soft. Normal appearance and bowel sounds are normal. He exhibits distension and ascites. There is generalized tenderness. There is no rigidity, no rebound and no guarding.   Musculoskeletal: Normal range of motion. He exhibits edema.   Neurological: He is alert and oriented to person, place, and time.   Skin: Skin is warm and dry. He is not diaphoretic.   Psychiatric: He has a normal mood and affect. Judgment normal.   Nursing note and vitals reviewed.        Diagnostic Study Results     Labs -     Recent Results (from the past 12 hour(s))   EKG, 12 LEAD, INITIAL    Collection Time: 06/16/17  8:34 AM   Result Value Ref Range    Ventricular Rate 105 BPM    Atrial Rate 105 BPM    P-R Interval 188 ms    QRS Duration 86 ms    Q-T Interval 352 ms    QTC Calculation (Bezet) 465 ms    Calculated P Axis -6 degrees    Calculated R Axis 7 degrees    Calculated T Axis 65 degrees    Diagnosis       Sinus tachycardia  Otherwise normal ECG  No previous ECGs available     METABOLIC PANEL, COMPREHENSIVE    Collection Time: 06/16/17  9:30 AM   Result Value Ref Range    Sodium 139 136 - 145 mmol/L    Potassium 4.8 3.5 - 5.5 mmol/L    Chloride 101 100 - 108 mmol/L    CO2 27 21 - 32 mmol/L    Anion gap 11 3.0 - 18 mmol/L    Glucose 77 74 - 99 mg/dL    BUN 49 (H) 7.0 - 18 MG/DL    Creatinine 9.17 (H) 0.6 - 1.3 MG/DL  BUN/Creatinine ratio 5 (L) 12 - 20      GFR est AA 7 (L) >60 ml/min/1.25m    GFR est non-AA 6 (L) >60 ml/min/1.769m   Calcium 6.0 (L) 8.5 - 10.1 MG/DL    Bilirubin, total 0.5 0.2 - 1.0 MG/DL    ALT (SGPT) 17 16 - 61 U/L    AST (SGOT) 35 15 - 37 U/L    Alk. phosphatase 241 (H) 45 - 117 U/L    Protein, total 6.5 6.4 - 8.2 g/dL    Albumin 2.9 (L) 3.4 - 5.0 g/dL    Globulin 3.6 2.0 - 4.0 g/dL    A-G Ratio 0.8 0.8 - 1.7     CBC WITH AUTOMATED DIFF    Collection Time: 06/16/17  9:30 AM   Result Value Ref Range    WBC 5.8 4.6 - 13.2 K/uL    RBC 2.64 (L) 4.70 - 5.50 M/uL    HGB 7.9 (L) 13.0 - 16.0 g/dL    HCT 24.6 (L) 36.0 - 48.0 %    MCV 93.2 74.0 - 97.0 FL    MCH 29.9 24.0 - 34.0 PG    MCHC 32.1 31.0 - 37.0 g/dL    RDW 17.2 (H) 11.6 - 14.5 %    PLATELET 104 (L) 135 - 420 K/uL    MPV 8.9 (L) 9.2 - 11.8 FL    NEUTROPHILS 81 (H) 40 - 73 %    LYMPHOCYTES 9 (L) 21 - 52 %    MONOCYTES 4 3 - 10 %    EOSINOPHILS 6 (H) 0 - 5 %    BASOPHILS 0 0 - 2 %    ABS. NEUTROPHILS 4.8 1.8 - 8.0 K/UL    ABS. LYMPHOCYTES 0.5 (L) 0.9 - 3.6 K/UL    ABS. MONOCYTES 0.2 0.05 - 1.2 K/UL    ABS. EOSINOPHILS 0.3 0.0 - 0.4 K/UL    ABS. BASOPHILS 0.0 0.0 - 0.1 K/UL    RBC COMMENTS ANISOCYTOSIS  1+        DF MANUAL     LIPASE    Collection Time: 06/16/17  9:30 AM   Result Value Ref Range    Lipase 408 (H) 73 - 393 U/L   CARDIAC PANEL,(CK, CKMB & TROPONIN)    Collection Time: 06/16/17  9:30 AM   Result Value Ref Range    CK 2,535 (H) 39 - 308 U/L    CK - MB 21.6 (H) <3.6 ng/ml    CK-MB Index 0.9 0.0 - 4.0 %    Troponin-I, QT 0.13 (H) 0.0 - 0.045 NG/ML   NT-PRO BNP    Collection Time: 06/16/17  9:30 AM   Result Value Ref Range    NT pro-BNP 96,272 (H) 0 - 450 PG/ML   MAGNESIUM    Collection Time: 06/16/17  9:30 AM   Result Value Ref Range    Magnesium 1.9 1.6 - 2.6 mg/dL   PROTHROMBIN TIME + INR    Collection Time: 06/16/17  9:30 AM   Result Value Ref Range    Prothrombin time 14.7 11.5 - 15.2 sec    INR 1.2 0.8 - 1.2     PTT     Collection Time: 06/16/17  9:30 AM   Result Value Ref Range    aPTT 35.4 23.0 - 36.4 SEC   HEMOGLOBIN A1C WITH EAG    Collection Time: 06/16/17  9:30 AM   Result Value Ref Range    Hemoglobin A1c 5.4 4.2 -  5.6 %    Est. average glucose 108 mg/dL   POC LACTIC ACID    Collection Time: 06/16/17  9:44 AM   Result Value Ref Range    Lactic Acid (POC) 0.91 0.40 - 2.00 mmol/L   CARDIAC PANEL,(CK, CKMB & TROPONIN)    Collection Time: 06/16/17 12:40 PM   Result Value Ref Range    CK 2,228 (H) 39 - 308 U/L    CK - MB 18.5 (H) <3.6 ng/ml    CK-MB Index 0.8 0.0 - 4.0 %    Troponin-I, QT 0.17 (H) 0.0 - 0.045 NG/ML   EKG, 12 LEAD, SUBSEQUENT    Collection Time: 06/16/17 12:50 PM   Result Value Ref Range    Ventricular Rate 102 BPM    Atrial Rate 102 BPM    P-R Interval 188 ms    QRS Duration 92 ms    Q-T Interval 386 ms    QTC Calculation (Bezet) 503 ms    Calculated P Axis -3 degrees    Calculated R Axis 10 degrees    Calculated T Axis 63 degrees    Diagnosis       Sinus tachycardia  Otherwise normal ECG  When compared with ECG of 16-Jun-2017 08:34,  No significant change was found         Radiologic Studies -     CT Results  (Last 48 hours)               06/16/17 1045  CT ABD PELV WO CONT Final result    Impression:  IMPRESSION:       1.  Cardiomegaly and small circumferential pericardial effusion with small   bilateral pleural effusions and minor alveolar and interstitial edema at the   lung bases.        2. Moderately large volume of intra-abdominal/pelvic ascites with diffuse body   wall edema.        3. Mild, nonspecific fluid distention of several loops of small bowel within the   midabdomen, potentially reactive ileus. No bowel obstruction. No free   intraperitoneal gas. Normal appendix.        4. Increased attenuation of the hepatic parenchyma on this unenhanced   examination, suggesting sequela of iron deposition, or potentially drug related   if the patient has required amiodarone in the past.        5. Skeletal stigmata of renal osteodystrophy as above.           Narrative:  EXAM: CT of the abdomen and pelvis       INDICATION: Abdominal pain, nausea, vomiting, end-stage renal disease.   Clostridium difficile colitis       COMPARISON: None.       TECHNIQUE: Axial CT imaging of the abdomen and pelvis was performed without oral   or intravenous contrast. Multiplanar reformats were generated.       One or more dose reduction techniques were used on this CT: automated exposure   control, adjustment of the mAs and/or kVp according to patient size, and   iterative reconstruction techniques.  The specific techniques used on this CT   exam have been documented in the patient's electronic medical record.  Digital   Imaging and Communications in Medicine (DICOM) format image data are available   to nonaffiliated external healthcare facilities or entities on a secure, media   free, reciprocally searchable basis with patient authorization for at least a   48-monthperiod after this study.  _______________       FINDINGS:       LOWER CHEST: Cardiac size is mildly enlarged with a small circumferential   pericardial effusion. Partial visualization of a dialysis catheter with tip   present in the right atrium. There are small bilateral pleural effusions   present. Faint interstitial and groundglass densities at the lung bases are   noted.       LIVER, BILIARY: Unenhanced appearance of the liver is remarkable for mild   hyperdensity noted diffusely. No focal hepatic lesion. No biliary dilation.   Gallbladder is unremarkable.       PANCREAS: Normal unenhanced appearance.       SPLEEN: Normal.       ADRENALS: Normal.       KIDNEYS/URETERS/BLADDER: Atrophic native kidneys in keeping with underlying   end-stage renal disease.       PELVIC ORGANS: Unremarkable.       VASCULATURE: Diffuse aortobiiliac atherosclerotic calcification is present   without evidence of aneurysmal dilatation.        LYMPH NODES: Scattered subcentimeter mesenteric and retroperitoneal lymph nodes   present without evidence of abdominal or pelvic adenopathy.       GASTROINTESTINAL TRACT: Several nondilated but mildly prominent fluid-filled   loops of small bowel noted within the proximal left midabdomen. Remaining small   bowel predominantly decompressed. No findings of bowel obstruction or free   intraperitoneal gas. Diverticulosis of the distal descending and sigmoid colon   present. Normal caliber appendix containing radiodense material likely from   prior oral contrast ingestion.       BONES: Diffusely increased osseous density noted with subperiosteal resorption   along the sacroiliac joints bilaterally. No acute osseous abnormality. No   suspicious or blastic osseous lesions.       OTHER: There is extensive body wall edema present with a moderately large amount   of intra-abdominal ascites. Suture anchors from prior lower abdominal hernia   repair.       _______________               CXR Results  (Last 48 hours)               06/16/17 0900  XR CHEST PORT Final result    Impression:  IMPRESSION:       Enlarged cardiac silhouette. No acute pulmonary process identified. Minimal   probable scarring and/or atelectasis in the right lung base.       Narrative:  EXAM: One-view chest       CLINICAL HISTORY: Shortness of breath, meets SIRS criteria ,       COMPARISON: None       FINDINGS:       Frontal view of the chest demonstrate no focal airspace consolidation. Minimal   streakiness in the right lung base likely representing some minor scarring or   atelectasis.. Cardiac silhouette is enlarged in size and contour. No acute bony   or soft tissue abnormality. Left approach dialysis type catheter tip near the   atriocaval junction. Right subclavian/brachiocephalic stent noted.                 Medications given in the ED-  Medications   sodium chloride (NS) flush 5-10 mL (not administered)    ondansetron (ZOFRAN) injection 4 mg (4 mg IntraVENous Given 06/16/17 0942)   fentaNYL citrate (PF) injection 50 mcg (50 mcg IntraVENous Given 06/16/17 0942)   vancomycin 50 mg/mL oral solution (compounded) 500 mg (500 mg Oral  Given 06/16/17 0947)   nitroglycerin (NITROBID) 2 % ointment 1 Inch (1 Inch Topical Given 06/16/17 0958)   morphine injection 4 mg (4 mg IntraVENous Given 06/16/17 1141)   ondansetron (ZOFRAN) injection 4 mg (4 mg IntraVENous Given 06/16/17 1141)   aspirin tablet 325 mg (325 mg Oral Given 06/16/17 1239)         Medical Decision Making   I am the first provider for this patient.    I reviewed the vital signs, available nursing notes, past medical history, past surgical history, family history and social history.    Vital Signs-Reviewed the patient's vital signs.    Pulse Oximetry Analysis - 100% on room air     EKG interpretation: (Preliminary)  8:34 AM   Sinus tachycardia. Rate 105 bpm. No ST elevation.  EKG read by Dionne Bucy, PA-C     EKG interpretation: (Secondary)  12:50 PM   Sinus tachycardia. Rate 102 bpm. No ST elevation.  EKG read by Dionne Bucy, PA-C     Records Reviewed: Nursing Notes and Old Medical Records   Reviewed EMR including [facility] ED visit January 26th and 30th of this year. Patient has hx of ESRD on dialysis MWF, asthma on home Oxygen 3-4 L continuous, MI in 2010 with 2 cardiac stents, HTN, COPD, tobacco use who presented with chest pain and abdominal pain and diarrhea. He reported that he had C. diff 1 month ago and that the diarrhea started to return 1 week ago. He was admitted for chest pain work up. They recommended a cardiac cath. However, patient refused as he repetitively requested Dilaudid and was unhappy with Morphine. He also refused and ECHO and left AMA. Cardiology evaluated the patient. They report a history of noncompliance. He has CHF with an EF of 50%. There are multiple notes about drug seeking behavior. Cardiology note from this  [facility] facility states that his last cardiac cath was in 2015 and was normal. The patient reports 2 stents in his heart but they could not find the record of this. Apparently he had a stress test in 2017 which was negative. There is no record from this [facility] facility regarding positive C. diff studies.     Provider Notes (Medical Decision Making): ACS/MI, pericarditis, myocarditis, arrhythmia, PNA, PTX, COPD, CHF, asthma, pleural effusion, C Diff, noncompliance    Procedures:  Procedures    ED Course:   8:25 AM Initial assessment performed. The patients presenting problems have been discussed, and they are in agreement with the care plan formulated and outlined with them.  I have encouraged them to ask questions as they arise throughout their visit.    8:41 AM  Discussed patient's history and exam with Laverta [place], DO, ED Attending, who recommends ordering a dry CT. Will treat empirically for C. Diff colitis. Patient has very poor peripheral access. Will likely need admission for antibiotics and likely paracentesis. Will order chest pain work up.     FACE-TO-FACE PROGRESS NOTE:  8:55 AM  The patient presents with chest pain, abdominal pain, and vomiting after [place] state and leaving [place].  My exam shows tachycardiac with a distended abdomen. Concerning for fluid overload as the pt is ESRD on HD. Imp/plan: Will obtain cardiac enzymes and a CT scan of abd/pelv to evaluate for progression of colitis, give dose of vancomycin and pt will likely require admission.   I have personally seen and examined the patient, reviewed the APP's note and  agree with findings and plan.  Written by Gregary Signs, ED Scribe, as dictated by Laverta Sibley, DO.    9:13 AM The patient is requesting pain medications prior to going to CT. The patient has poor peripheral access. Offered IM, and the patient declines. Will obtain access to give pain medications prior to having the  patient go to CT.     11:26 AM I reentered the room and discussed the need for admission to the hospitalist to be seen by cardiology and nephrology. He will likely need dialysis, and he agrees to stay. He is asking for something else for pain as the Fentanyl was not helping his discomfort. He requested Dilaudid but I discussed that we do not carry this at our facility. He requests Morphine but he initially reported to the nurse that he was allergic. He now reports he is not allergic.     11:40 AM Discussed patient's history, exam, and available diagnostics results with Dorathy Kinsman, DO, nephrology, who will see the patient.    11:47 AM Discussed patient's history, exam, and available diagnostics results with Ival Bible, MD, cardiology, who recommends giving ASA and NTG. There is no need for full anticoagulation. Will order serial enzymes, and he will see the patient.      12:24 PM Discussed patient's history, exam, and available diagnostics results with Marlane Hatcher, MD, internal medicine, who agrees to admit the patient to Telemetry.     Diagnosis and Disposition     Critical Care Time:   I have spent 37 minutes of critical care time involved in lab review, consultations with specialist, family decision-making, and documentation.  During this entire length of time I was immediately available to the patient.    Critical Care:  The reason for providing this level of medical care for this critically ill patient was due a critical illness that impaired one or more vital organ systems such that there was a high probability of imminent or life threatening deterioration in the patients condition. This care involved high complexity decision making to assess, manipulate, and support vital system functions, to treat this degreee vital organ system failure and to prevent further life threatening deterioration of the patient???s condition.     Core Measures:  For Hospitalized Patients:    1. Hospitalization Decision Time:   The decision to hospitalize the patient was made by Dionne Bucy, PA-C at 8:40 AM on 06/16/2017    2. Aspirin: Aspirin was given    12:26 PM  Patient is being admitted to the hospital by Marlane Hatcher, MD to Telemetry. The results of their tests and reasons for their admission have been discussed with them and/or available family. They convey agreement and understanding for the need to be admitted and for their admission diagnosis.      CONDITIONS ON ADMISSION:  Sepsis is not present at the time of admission.Deep Vein Thrombosis is not present at the time of admission. Thrombosis is not present at the time of admission. Pneumonia is not present at the time of admission. MRSA is not present at the time of admission. Wound infection is not present at the time of admission. Pressure Ulcer is not present at the time of admission.      CLINICAL IMPRESSION:    1. Acute chest pain    2. Anasarca associated with disorder of kidney    3. Elevated troponin    4. Acute on chronic congestive heart failure, unspecified heart failure type ([place])  5. ESRD on dialysis ([place])    6. Pericardial effusion    7. Pleural effusion    8. Tobacco use    9. Oxygen dependent       _______________________________    Attestations:  This note is prepared by Ned Clines, acting as Scribe for [employer], PA-C.    Dionne Bucy, PA-C:  The scribe's documentation has been prepared under my direction and personally reviewed by me in its entirety.  I confirm that the note above accurately reflects all work, treatment, procedures, and medical decision making performed by me.    This note is prepared by Delaney Meigs, acting as Scribe for [employer], DO.    Laverta Briny [place]reezes, DO: The scribe's documentation has been prepared under my direction and personally reviewed by me in its entirety. I confirm that the note above accurately reflects all work, treatment, procedures, and medical decision making performed by me.     _______________________________

## 2017-06-16 NOTE — Other (Signed)
TRANSFER - IN REPORT:    Telephone report received from N. Lestine Mountyala (name) on David [name]  being received from ER (unit) for routine progression of care      Report consisted of patient???s Situation, Background, Assessment and   Recommendations(SBAR).     Information from the following report(s) SBAR, Kardex, Intake/Output, MAR and Recent Results was reviewed with the receiving nurse.    Opportunity for questions and clarification was provided.      Assessment completed upon patient???s arrival to unit and care assumed.

## 2017-06-16 NOTE — Discharge Summary (Signed)
David [name] is a 47 y.o. male who has past history of CAD, stent placment, HTN, asthma, ESRD on home oxygen presents to ER with concerns of abdominal pain. Patient reports that he has abdominal for months, he also complains of chest pain. [place] in [place] and is visiting this area. He has history of C-diff multiple times and he has been having diarrhea for the past one week. He has chest pain for over a month and abdominal pain for long time. He was admitted at [name] on May 19, 2017. He was admitted for chest pain and elevated troponin, he was advised to get coronary angiogram, he refused and per discharge summary from [facility], patient signed out AMA as he wanted to get dilaudid and not morphine. He also has been using different names when he visits hospital. I discussed with patient and discussed the plan with him however he does not want to stay unless he gets dilaudid.     He is alert and oriented, and able to clearly report the risks of leaving the hospital w/o further intervention. This examiner had a long discussion with the patient at his bedside regarding the risks of leaving the hospital AMA and benefits of further treatment. The patient's decisional capacity is intact and clearly understands what this examiner is recommending and why. The patient is fully aware of the risks including, but not limited to the following: worsening symptoms, decline in functional status, and even the possiblity of death. The patient declines the recommendations at this time. The patient clearly understands that he may return to the ED at any time for further evaluation and treatment. The patient is adamant of leaving the hospital AMA.

## 2017-06-17 ENCOUNTER — Inpatient Hospital Stay
Admit: 2017-06-17 | Discharge: 2017-06-18 | Discharge status: Against Medical Advice | Payer: Self-pay | Attending: Internal Medicine | Admitting: Internal Medicine

## 2017-06-17 ENCOUNTER — Emergency Department: Admit: 2017-06-17 | Payer: Self-pay

## 2017-06-17 ENCOUNTER — Inpatient Hospital Stay

## 2017-06-17 DIAGNOSIS — R188 Other ascites: Principal | ICD-10-CM

## 2017-06-17 LAB — EKG, 12 LEAD, INITIAL
Atrial Rate: 104 {beats}/min
Atrial Rate: 106 {beats}/min
Calculated P Axis: 2 degrees
Calculated P Axis: 69 degrees
Calculated R Axis: -19 degrees
Calculated R Axis: 1 degrees
Calculated T Axis: 54 degrees
Calculated T Axis: 90 degrees
P-R Interval: 194 ms
P-R Interval: 194 ms
Q-T Interval: 358 ms
Q-T Interval: 364 ms
QRS Duration: 88 ms
QRS Duration: 90 ms
QTC Calculation (Bezet): 470 ms
QTC Calculation (Bezet): 483 ms
Ventricular Rate: 104 {beats}/min
Ventricular Rate: 106 {beats}/min

## 2017-06-17 LAB — METABOLIC PANEL, COMPREHENSIVE
ALT (SGPT): 19 U/L (ref 12–78)
AST (SGOT): 38 U/L — ABNORMAL HIGH (ref 15–37)
Albumin: 2.9 gm/dl — ABNORMAL LOW (ref 3.4–5.0)
Alk. phosphatase: 240 U/L — ABNORMAL HIGH (ref 45–117)
Anion gap: 11 mmol/L (ref 5–15)
BUN: 54 mg/dl — ABNORMAL HIGH (ref 7–25)
Bilirubin, total: 0.4 mg/dl (ref 0.2–1.0)
CO2: 26 mEq/L (ref 21–32)
Calcium: 6 mg/dl — ABNORMAL LOW (ref 8.5–10.1)
Chloride: 103 mEq/L (ref 98–107)
Creatinine: 10.1 mg/dl — ABNORMAL HIGH (ref 0.6–1.3)
GFR est AA: 7
GFR est non-AA: 6
Glucose: 86 mg/dl (ref 74–106)
Potassium: 5.2 mEq/L — ABNORMAL HIGH (ref 3.5–5.1)
Protein, total: 7 gm/dl (ref 6.4–8.2)
Sodium: 140 mEq/L (ref 136–145)

## 2017-06-17 LAB — TROPONIN I
Troponin-I: 0.638 ng/ml — CR (ref 0.000–0.045)
Troponin-I: 0.487 ng/ml — ABNORMAL HIGH (ref 0.000–0.045)

## 2017-06-17 LAB — CBC WITH AUTOMATED DIFF
BASOPHILS: 0.5 % (ref 0–3)
EOSINOPHILS: 10.1 % — ABNORMAL HIGH (ref 0–5)
HCT: 24.6 % — ABNORMAL LOW (ref 37.0–50.0)
HGB: 8 gm/dl — ABNORMAL LOW (ref 12.4–17.2)
IMMATURE GRANULOCYTES: 0.2 % (ref 0.0–3.0)
LYMPHOCYTES: 6.5 % — ABNORMAL LOW (ref 28–48)
MCH: 30.9 pg (ref 23.0–34.6)
MCHC: 32.5 gm/dl (ref 30.0–36.0)
MCV: 95 fL (ref 80.0–98.0)
MONOCYTES: 6.6 % (ref 1–13)
MPV: 9.7 fL (ref 6.0–10.0)
NEUTROPHILS: 76.1 % — ABNORMAL HIGH (ref 34–64)
NRBC: 0 (ref 0–0)
PLATELET: 105 10*3/uL — ABNORMAL LOW (ref 140–450)
RBC: 2.59 M/uL — ABNORMAL LOW (ref 3.80–5.70)
RDW-SD: 58.3 — ABNORMAL HIGH (ref 35.1–43.9)
WBC: 6.3 10*3/uL (ref 4.0–11.0)

## 2017-06-17 LAB — PROTHROMBIN TIME + INR
INR: 1.1 (ref 0.1–1.1)
Prothrombin time: 13 seconds — ABNORMAL HIGH (ref 10.2–12.9)

## 2017-06-17 LAB — METABOLIC PANEL, BASIC
Anion gap: 11 mmol/L (ref 5–15)
BUN: 57 mg/dl — ABNORMAL HIGH (ref 7–25)
CO2: 25 mEq/L (ref 21–32)
Calcium: 6.2 mg/dl — ABNORMAL LOW (ref 8.5–10.1)
Chloride: 103 mEq/L (ref 98–107)
Creatinine: 9.9 mg/dl — ABNORMAL HIGH (ref 0.6–1.3)
GFR est AA: 7
GFR est non-AA: 6
Glucose: 88 mg/dl (ref 74–106)
Potassium: 5.2 mEq/L — ABNORMAL HIGH (ref 3.5–5.1)
Sodium: 138 mEq/L (ref 136–145)

## 2017-06-17 LAB — HEP B SURFACE AB: Hepatitis B surface Ab: REACTIVE

## 2017-06-17 LAB — HEP B SURFACE AG: Hepatitis B surface Ag: NONREACTIVE

## 2017-06-17 LAB — NT-PRO BNP: NT pro-BNP: 100456 pg/ml — ABNORMAL HIGH (ref 0.0–125.0)

## 2017-06-17 LAB — EKG 12-LEAD
Atrial Rate: 104 {beats}/min
Atrial Rate: 106 {beats}/min
P Axis: 2 degrees
P Axis: 69 degrees
P-R Interval: 194 ms
P-R Interval: 194 ms
Q-T Interval: 358 ms
Q-T Interval: 364 ms
QRS Duration: 88 ms
QRS Duration: 90 ms
QTc Calculation (Bazett): 470 ms
QTc Calculation (Bazett): 483 ms
R Axis: -19 degrees
R Axis: 1 degrees
T Axis: 54 degrees
T Axis: 90 degrees
Ventricular Rate: 104 {beats}/min
Ventricular Rate: 106 {beats}/min

## 2017-06-17 LAB — HEPATITIS B SURFACE ANTIBODY: Hep B S Ab: REACTIVE

## 2017-06-17 MED ORDER — MELATONIN 3 MG TAB
3 mg | Freq: Every evening | ORAL | Status: DC | PRN
Start: 2017-06-17 — End: 2017-06-18

## 2017-06-17 MED ORDER — NALOXONE 0.4 MG/ML INJECTION
0.4 mg/mL | INTRAMUSCULAR | Status: DC | PRN
Start: 2017-06-17 — End: 2017-06-18

## 2017-06-17 MED ORDER — EPOETIN ALFA 20,000 UNIT/ML IJ SOLN
20000 unit/mL | INTRAMUSCULAR | Status: DC
Start: 2017-06-17 — End: 2017-06-18

## 2017-06-17 MED ORDER — HYDRALAZINE 20 MG/ML IJ SOLN
20 mg/mL | Freq: Four times a day (QID) | INTRAMUSCULAR | Status: DC | PRN
Start: 2017-06-17 — End: 2017-06-18

## 2017-06-17 MED ORDER — HYDRALAZINE 20 MG/ML IJ SOLN
20 mg/mL | INTRAMUSCULAR | Status: AC
Start: 2017-06-17 — End: 2017-06-17
  Administered 2017-06-17: 13:00:00 via INTRAVENOUS

## 2017-06-17 MED ORDER — HYDROMORPHONE 1 MG/ML ORAL LIQUID
1 mg/mL | ORAL | Status: DC | PRN
Start: 2017-06-17 — End: 2017-06-17

## 2017-06-17 MED ORDER — CARVEDILOL 6.25 MG TAB
6.25 mg | Freq: Two times a day (BID) | ORAL | Status: DC
Start: 2017-06-17 — End: 2017-06-18
  Administered 2017-06-17: 21:00:00 via ORAL

## 2017-06-17 MED ORDER — AMLODIPINE 5 MG TAB
5 mg | Freq: Every day | ORAL | Status: DC
Start: 2017-06-17 — End: 2017-06-18
  Administered 2017-06-17: 21:00:00 via ORAL

## 2017-06-17 MED ORDER — ACETAMINOPHEN 325 MG TABLET
325 mg | Freq: Four times a day (QID) | ORAL | Status: DC | PRN
Start: 2017-06-17 — End: 2017-06-18

## 2017-06-17 MED ORDER — CARVEDILOL 6.25 MG TAB
6.25 mg | Freq: Two times a day (BID) | ORAL | Status: DC
Start: 2017-06-17 — End: 2017-06-17

## 2017-06-17 MED ORDER — OXYCODONE 5 MG TAB
5 mg | ORAL | Status: DC | PRN
Start: 2017-06-17 — End: 2017-06-18

## 2017-06-17 MED ORDER — HYDROMORPHONE (PF) 1 MG/ML IJ SOLN
1 mg/mL | INTRAMUSCULAR | Status: AC
Start: 2017-06-17 — End: 2017-06-17
  Administered 2017-06-17: 16:00:00 via INTRAVENOUS

## 2017-06-17 MED ORDER — SODIUM CHLORIDE 0.9% BOLUS IV
0.9 % | INTRAVENOUS | Status: DC | PRN
Start: 2017-06-17 — End: 2017-06-18

## 2017-06-17 MED ORDER — HEPARIN (PORCINE) 5,000 UNIT/ML IJ SOLN
5000 unit/mL | Freq: Two times a day (BID) | INTRAMUSCULAR | Status: DC
Start: 2017-06-17 — End: 2017-06-18

## 2017-06-17 MED ORDER — VANCOMYCIN ORAL SOLUTION 100 MG/ML CPD
100 mg/ml | Freq: Four times a day (QID) | ORAL | Status: DC
Start: 2017-06-17 — End: 2017-06-18
  Administered 2017-06-18 (×2): via ORAL

## 2017-06-17 MED ORDER — NITROGLYCERIN 0.4 MG SUBLINGUAL TAB
0.4 mg | SUBLINGUAL | Status: DC | PRN
Start: 2017-06-17 — End: 2017-06-18
  Administered 2017-06-17 (×2): via SUBLINGUAL

## 2017-06-17 MED ORDER — HYDROMORPHONE (PF) 1 MG/ML IJ SOLN
1 mg/mL | INTRAMUSCULAR | Status: DC | PRN
Start: 2017-06-17 — End: 2017-06-18
  Administered 2017-06-17 – 2017-06-18 (×3): via INTRAVENOUS

## 2017-06-17 MED ORDER — ASPIRIN 81 MG CHEWABLE TAB
81 mg | ORAL | Status: AC
Start: 2017-06-17 — End: 2017-06-17
  Administered 2017-06-17: 12:00:00 via ORAL

## 2017-06-17 MED ORDER — AMLODIPINE 5 MG TAB
5 mg | Freq: Every day | ORAL | Status: DC
Start: 2017-06-17 — End: 2017-06-17

## 2017-06-17 MED ORDER — HYDROMORPHONE (PF) 1 MG/ML IJ SOLN
1 mg/mL | INTRAMUSCULAR | Status: AC
Start: 2017-06-17 — End: 2017-06-17
  Administered 2017-06-17: 13:00:00 via INTRAVENOUS

## 2017-06-17 MED ORDER — LACTOBACILLUS RHAMNOSUS GG 10 BILLION CELL CAP
10 billion cell | Freq: Two times a day (BID) | ORAL | Status: DC
Start: 2017-06-17 — End: 2017-06-18

## 2017-06-17 MED ORDER — SODIUM CHLORIDE 0.9 % IJ SYRG
Freq: Three times a day (TID) | INTRAMUSCULAR | Status: DC
Start: 2017-06-17 — End: 2017-06-18
  Administered 2017-06-18 (×2): via INTRAVENOUS

## 2017-06-17 MED ORDER — DIPHENHYDRAMINE 25 MG CAP
25 mg | Freq: Four times a day (QID) | ORAL | Status: DC | PRN
Start: 2017-06-17 — End: 2017-06-18

## 2017-06-17 MED ORDER — HEPARIN (PORCINE) 1,000 UNIT/ML IJ SOLN
1000 unit/mL | INTRAMUSCULAR | Status: DC
Start: 2017-06-17 — End: 2017-06-18
  Administered 2017-06-17: 17:00:00 via ARTERIOVENOUS_FISTULA

## 2017-06-17 MED ORDER — ONDANSETRON (PF) 4 MG/2 ML INJECTION
4 mg/2 mL | INTRAMUSCULAR | Status: DC | PRN
Start: 2017-06-17 — End: 2017-06-18
  Administered 2017-06-17: via INTRAVENOUS

## 2017-06-17 MED ORDER — ALBUMIN, HUMAN 25 % IV
25 % | INTRAVENOUS | Status: DC | PRN
Start: 2017-06-17 — End: 2017-06-18

## 2017-06-17 MED ORDER — ONDANSETRON (PF) 4 MG/2 ML INJECTION
4 mg/2 mL | Freq: Once | INTRAMUSCULAR | Status: AC
Start: 2017-06-17 — End: 2017-06-17
  Administered 2017-06-17: 12:00:00 via INTRAVENOUS

## 2017-06-17 MED ORDER — SEVELAMER CARBONATE 800 MG TAB
800 mg | Freq: Three times a day (TID) | ORAL | Status: DC
Start: 2017-06-17 — End: 2017-06-18
  Administered 2017-06-17 (×2): via ORAL

## 2017-06-17 MED ORDER — HYDRALAZINE 20 MG/ML IJ SOLN
20 mg/mL | Freq: Four times a day (QID) | INTRAMUSCULAR | Status: DC | PRN
Start: 2017-06-17 — End: 2017-06-17

## 2017-06-17 MED ORDER — HEPARIN (PORCINE) 1,000 UNIT/ML IJ SOLN
1000 unit/mL | INTRAMUSCULAR | Status: DC
Start: 2017-06-17 — End: 2017-06-17

## 2017-06-17 MED ORDER — SODIUM CHLORIDE 0.9 % IJ SYRG
INTRAMUSCULAR | Status: DC | PRN
Start: 2017-06-17 — End: 2017-06-18

## 2017-06-17 MED ORDER — PROMETHAZINE 25 MG TAB
25 mg | Freq: Four times a day (QID) | ORAL | Status: DC | PRN
Start: 2017-06-17 — End: 2017-06-18

## 2017-06-17 MED ORDER — IPRATROPIUM-ALBUTEROL 2.5 MG-0.5 MG/3 ML NEB SOLUTION
2.5 mg-0.5 mg/3 ml | RESPIRATORY_TRACT | Status: DC | PRN
Start: 2017-06-17 — End: 2017-06-18
  Administered 2017-06-18: 03:00:00 via RESPIRATORY_TRACT

## 2017-06-17 MED FILL — HYDROMORPHONE (PF) 1 MG/ML IJ SOLN: 1 mg/mL | INTRAMUSCULAR | Qty: 1

## 2017-06-17 MED FILL — SEVELAMER CARBONATE 800 MG TAB: 800 mg | ORAL | Qty: 1

## 2017-06-17 MED FILL — AMLODIPINE 5 MG TAB: 5 mg | ORAL | Qty: 2

## 2017-06-17 MED FILL — SODIUM CHLORIDE 0.9 % IV: INTRAVENOUS | Qty: 500

## 2017-06-17 MED FILL — ASPIRIN 81 MG CHEWABLE TAB: 81 mg | ORAL | Qty: 4

## 2017-06-17 MED FILL — ONDANSETRON (PF) 4 MG/2 ML INJECTION: 4 mg/2 mL | INTRAMUSCULAR | Qty: 2

## 2017-06-17 MED FILL — HYDRALAZINE 20 MG/ML IJ SOLN: 20 mg/mL | INTRAMUSCULAR | Qty: 1

## 2017-06-17 MED FILL — CARVEDILOL 6.25 MG TAB: 6.25 mg | ORAL | Qty: 2

## 2017-06-17 MED FILL — HEPARIN (PORCINE) 1,000 UNIT/ML IJ SOLN: 1000 unit/mL | INTRAMUSCULAR | Qty: 10

## 2017-06-17 MED FILL — NITROGLYCERIN 0.4 MG SUBLINGUAL TAB: 0.4 mg | SUBLINGUAL | Qty: 1

## 2017-06-17 NOTE — ED Notes (Signed)
Pt continues to c/o pain and requests something else for pain, pt removed pulse ox sensor, states "it is irritating my finger."

## 2017-06-17 NOTE — ED Triage Notes (Signed)
Pt with c/o l side chest pain radiating into L arm and jaw since 4pm, states pain got progressively worse, pt is HD patient and last treatment was Wednesday, pt also reports was diagnosed with c-diff on Friday, reports multiple episodes of diarrhea everyday.

## 2017-06-17 NOTE — Progress Notes (Signed)
2D echocardiogram was 1/2 done when pt.told me to stop that I was "hurting him".  Pt kept falling asleep during procedure with deep snores.

## 2017-06-17 NOTE — Other (Signed)
Respiratory therapist paged for patient request of breathing treatment for shortness of breath.

## 2017-06-17 NOTE — ED Notes (Signed)
Pt states nitro is not helping and it is just giving him a h/a

## 2017-06-17 NOTE — ED Provider Notes (Signed)
Register  Emergency Department Treatment Report    Patient: David [name] Age: 46 y.o. Sex: male    Date of Birth: 12/01/1969 Admit Date: 06/17/2017 PCP: Candis Schatz, MD   MRN: [id number]  CSN: [id number]  Attending:  Kandyce Rud, MD   Room: ER20/ER20 Time Dictated: 6:19 AM APP: Kandyce Rud, MD     Chief Complaint   Chief Complaint   Patient presents with   ??? Chest Pain   ??? Shortness of Breath   ??? Diarrhea       History of Present Illness   47 y.o. male presents with a chief complaint of vomiting and diarrhea as well as chest pain.  He states that he was diagnosed with Clostridium difficile last Friday and since then he has had multiple episodes of diarrhea and vomiting.  He states that he has been unable to keep anything down, and that his run of dialysis on Wednesday was short because of repeated episodes of emesis.  He comes in today complaining of diarrhea and vomiting as well as intermittent pains in his chest.  He states he took a sublingual nitroglycerin but it did not help.  Also, he states he has a lot of abdominal distention secondary to ascites.  He states that he has had to have fairly frequent paracentesis for fluid in his abdomen.    Review of Systems   Review of Systems   Constitutional: Positive for malaise/fatigue. Negative for chills and fever.   HENT: Negative for ear pain, hearing loss and tinnitus.    Eyes: Negative for blurred vision.   Respiratory: Positive for shortness of breath. Negative for cough.    Cardiovascular: Positive for chest pain.   Gastrointestinal: Positive for abdominal pain, diarrhea, nausea and vomiting.   Genitourinary: Negative for dysuria.   Musculoskeletal: Negative for myalgias and neck pain.   Skin: Negative for rash.   Neurological: Positive for weakness. Negative for dizziness and headaches.   Endo/Heme/Allergies: Does not bruise/bleed easily.   Psychiatric/Behavioral: Negative for substance abuse.       Past Medical/Surgical History      Past Medical History:   Diagnosis Date   ??? Ascites    ??? Asthma    ??? Chronic kidney disease    ??? History of cardiac cath     with 2 stents   ??? Hypertension    ??? Infectious disease    ??? Myocardial infarction [facility])      History reviewed. No pertinent surgical history.    Social History     Social History     Socioeconomic History   ??? Marital status: SINGLE     Spouse name: Not on file   ??? Number of children: Not on file   ??? Years of education: Not on file   ??? Highest education level: Not on file   Tobacco Use   ??? Smoking status: Current Every Day Smoker     Packs/day: 0.25   ??? Smokeless tobacco: Never Used   Substance and Sexual Activity   ??? Alcohol use: No     Frequency: Never   ??? Drug use: No   ??? Sexual activity: Not Currently       Family History   History reviewed. No pertinent family history.    Home Medications     Prior to Admission Medications   Prescriptions Last Dose Informant Patient Reported? Taking?   albuterol (PROVENTIL HFA, VENTOLIN HFA, PROAIR HFA) 90 mcg/actuation inhaler  Self  Yes Yes   Sig: Take 2 Puffs by inhalation every four (4) hours as needed for Wheezing.   amLODIPine (NORVASC) 10 mg tablet  Self Yes Yes   Sig: Take 10 mg by mouth daily.   carvedilol (COREG) 12.5 mg tablet  Self Yes Yes   Sig: Take 12.5 mg by mouth two (2) times daily (with meals).   furosemide (LASIX) 80 mg tablet  Self Yes Yes   Sig: Take 80 mg by mouth daily.   hydrALAZINE (APRESOLINE) 100 mg tablet  Self Yes Yes   Sig: Take 100 mg by mouth three (3) times daily.   sevelamer (RENAGEL) 400 mg tablet  Self Yes Yes   Sig: Take 800 mg by mouth three (3) times daily (with meals).      Facility-Administered Medications: None       Allergies     Allergies   Allergen Reactions   ??? Lisinopril Anaphylaxis   ??? Morphine Anaphylaxis   ??? Toradol [Ketorolac] Anaphylaxis       Physical Exam     ED Triage Vitals   ED Encounter Vitals Group      BP 06/17/17 0456 (!) 169/123      Pulse (Heart Rate) 06/17/17 0538 (!) 108       Resp Rate 06/17/17 0456 18      Temp 06/17/17 0456 98.2 ??F (36.8 ??C)      Temp src --       O2 Sat (%) 06/17/17 0456 100 %      Weight 06/17/17 0456 245 lb      Height 06/17/17 0456 '6\' 1"'        Physical Exam   Constitutional: He is oriented to person, place, and time. He appears distressed.   HENT:   Head: Normocephalic and atraumatic.   Eyes: EOM are normal. Pupils are equal, round, and reactive to light. Right eye exhibits no discharge. Left eye exhibits discharge.   Neck: Normal range of motion. Neck supple.   Cardiovascular: Normal rate, regular rhythm and normal heart sounds.   Pulmonary/Chest:   He has somewhat decreased breath sounds bilaterally  He has a dialysis catheter in the left anterior chest wall   Abdominal:   Patient has ascites, somewhat distended, with minimal tenderness diffusely.   Musculoskeletal: Normal range of motion. He exhibits tenderness and deformity.   Neurological: He is alert and oriented to person, place, and time. No cranial nerve deficit. GCS score is 15.   Skin: Skin is warm and dry.   Psychiatric: Affect normal.         Impression and Management Plan   Have discussed the case with nephrology as well as cardiology and internal medicine, will admit.  Patient will be dialyzed this morning which will hopefully help his potassium as well as his shortness of breath.  He may need a paracentesis at some point but he does not need one currently.        Diagnostic Studies   Lab:   Labs Reviewed   CBC WITH AUTOMATED DIFF - Abnormal; Notable for the following components:       Result Value    RBC 2.59 (*)     HGB 8.0 (*)     HCT 24.6 (*)     PLATELET 105 (*)     RDW-SD 58.3 (*)     NEUTROPHILS 76.1 (*)     LYMPHOCYTES 6.5 (*)     EOSINOPHILS 10.1 (*)     All other components  within normal limits   METABOLIC PANEL, BASIC - Abnormal; Notable for the following components:    Potassium 5.2 (*)     BUN 57 (*)     Creatinine 9.9 (*)     Calcium 6.2 (*)      All other components within normal limits   TROPONIN I - Abnormal; Notable for the following components:    Troponin-I 0.638 (*)     All other components within normal limits   NT-PRO BNP - Abnormal; Notable for the following components:    NT pro-BNP 100,456.0 (*)     All other components within normal limits   METABOLIC PANEL, COMPREHENSIVE - Abnormal; Notable for the following components:    Potassium 5.2 (*)     BUN 54 (*)     Creatinine 10.1 (*)     Calcium 6.0 (*)     AST (SGOT) 38 (*)     Alk. phosphatase 240 (*)     Albumin 2.9 (*)     All other components within normal limits   PROTHROMBIN TIME + INR - Abnormal; Notable for the following components:    Prothrombin time 13.0 (*)     All other components within normal limits       Imaging:    Xr Chest Sngl V    Result Date: 06/17/2017  INDICATION: chest pain, sob   EXAMINATION: XR CHEST SNGL V COMPARISON: None FINDINGS: Mild cardiomegaly.. The lungs are clear . Right subclavian stent and dialysis catheter.     IMPRESSION: 1. Mild cardiomegaly. 2. No acute pulmonary process.       EKG: 04:59, sinus tach, no st elevation, nonsp t wave changes  EKG: 07:49 Sinus tach 108, normal axis and intervals    ED Course/Medical Decision Making   47 year old male with multiple medical problems including ascites, congestive heart failure, chronic renal failure, C. difficile, and history of coronary artery disease, with most of his care performed in [place].  Here his troponin is elevated at 0.68 which is above his baseline, and his blood pressures have been quite elevated.  3 nitroglycerin given with minimal relief of pain, he did receive Dilaudid after that and is feeling better.  20 mg of hydralazine given IV.  I did not give aspirin because he has had bleeding problems in the past and was taken off of aspirin because of it.  Have paged nephrology, cardiology, and the hospitalist and will plan to admit we will continue to follow  Medications    nitroglycerin (NITROSTAT) tablet 0.4 mg (0.4 mg SubLINGual Given 06/17/17 0657)   aspirin chewable tablet 324 mg (324 mg Oral Given 06/17/17 0642)   ondansetron (ZOFRAN) injection 4 mg (4 mg IntraVENous Given 06/17/17 3664)   hydrALAZINE (APRESOLINE) 20 mg/mL injection 20 mg (20 mg IntraVENous Given 06/17/17 0749)   HYDROmorphone (PF) (DILAUDID) injection 0.5 mg (0.5 mg IntraVENous Given 06/17/17 0803)     CRITICAL CARE (ASAP ONLY)  Performed by: Kandyce Rud, MD  Authorized by: Kandyce Rud, MD     Critical care provider statement:     Critical care time (minutes):  45    Critical care start time:  06/17/2017 6:35 AM    Critical care end time:  06/17/2017 7:35 AM    Critical care was necessary to treat or prevent imminent or life-threatening deterioration of the following conditions:  Cardiac failure and renal failure    Critical care was time spent personally by me on the following activities:  Ordering and review of laboratory studies, ordering and review of radiographic studies, ordering and performing treatments and interventions, re-evaluation of patient's condition, review of old charts, evaluation of patient's response to treatment, discussions with consultants and discussions with primary provider    I assumed direction of critical care for this patient from another provider in my specialty: no            Final Diagnosis       ICD-10-CM ICD-9-CM   1. Coronary syndrome, acute (HCC) I24.9 411.1   2. Congestive heart failure, unspecified HF chronicity, unspecified heart failure type ([place]) I50.9 428.0   3. C. difficile colitis A04.72 008.45       Disposition   admit      Kandyce Rud, MD  June 17, 2017    My signature above authenticates this document and my orders, the final ??  diagnosis (es), discharge prescription (s), and instructions in the Epic ??  record.  If you have any questions please contact [phone].  ??  Nursing notes have been reviewed by the physician/ advanced practice ??  Clinician.     Dragon medical dictation software was used for portions of this report.  Unintended transcription errors may occur.

## 2017-06-17 NOTE — Consults (Addendum)
[facility] Consultation Note    Referring Physician: Docia Furl, MD   Outpatient Cardiologist: None  PCP: Jeanell Sparrow, MD   DOA: 06/17/2017     David [name] is a 47 y.o. male who is being seen on consult for elevated troponin/HTN.    Impression:   Cp w/ Elevated Troponin   -Troponin 0.638   -EKG, personally reviewed    -CXR 06/17/2017: Mild cardiomegaly.The lungs are clear. Right subclavian stent and dialysis catheter.  HTN Urgency  C Diff, acute  N/V w/ Ascites, acute    -recurrent paracentesis in past  CAD   -s/p MI and PCI with stents x 2 in 2010   ESRD on HD  Tobacco Abuse, ongoing   Hx of LUE AVF infection  Chronic Respiratory failure, per chart review   Hx of Medication Noncompliance, per chart review  Drug Seeking Behavior, per chart review   Not on ASA d/t Bleeding Issues    Recommendations:   1. Check Echo for EF and WMA  2. Now s/p HD with 3 L removal, will need Paracentesis for large ascites, continue volume optimization per renal  3. Restart home coreg 12.5mg  BID and norvasc 10 mg. IV hydralazine PRN SBP >160. Would uptitrate Coreg in AM if remains hypertesive  4. Mild Trop and chest pressure 2/2 severe fluid overload, and HTN emergency, not likely primary plaque rupture process.   5. Not anticipating invasvie ischemic evaluation at this time unless significant changes on echo. Continue medical management of CAD.       CARDIOLOGY ATTENDING    Patient independently seen and examined; The relevant hemodynamic, ECG/tracing, laboratory, radiographic data, and cardiac imaging were personally reviewed by me. The PA's documentation was reviewed and I have integrated my findings, assessment and plan in attached note.       Haydee Salter, MD, PhD, RPVI, [facility]  [facility]  Pager Pager: [phone]  06/17/2017    Admission diagnosis: C diff Colitis  Patient Active Problem List   Diagnosis Code   ??? Chest pain R07.9   ??? CAD (coronary artery disease) I25.10    ??? ESRD (end stage renal disease) (HCC) N18.6   ??? HTN (hypertension) I10   ??? Ascites R18.8   ??? Diarrhea R19.7   ??? Nausea & vomiting R11.2   ??? HTN (hypertension), malignant I10   ??? Coronary syndrome, acute (HCC) I24.9     HPI: David [name] is a 47 y.o. male with PMH significant for as stated above who presented to [facility] ED on 06/17/2017 with complaints of L sided chest pain radiating to L arm and jaw since 4 pm that has grown progressively worse. He reports being diagnosed with CDIFF last Friday which is not being treated and he continues to have diarrhea everyday. He also endorses N/V since last Friday and unable to keep anything down, he had to cut HD short on Wednesday d/t recurrent Vomiting. He took SL nitro at home when he had CP and it did not help. He also endorses growing abd distention secondary to ascites which he frequently gets paracentesis.   While in the ED, his BP was 169/123,  his proBNP was >100,000, his troponin was elevated his ekg was non ischemic, his CXR was cardiomegaly no acute processes, his K was elevated at 5.2, and his Cr was 9.9. He was admitted and in HD this am. His CP in the am was nonexertional, did not relieve with nitro times three but did resolve with dilaudid.  [facility] [place] [facility] [place] and left AMA because they would not treat his pain. At that time he also reported he was diagnosed with CDIFF one month prior to that stay and had similar chest pressue with vomiting. Appears patient [place] many [place]'s [place] for these similar issues.    Currently patient denies active chest pain or pressure, dyspnea, PND, orthopnea, palpitations, syncope, edema, nausea/vomiting, and diaphoresis.     Significant Social History: He reports being current everyday smoker, 0.25 ppd, denies alcohol or illicit drug use.     Significant Cardiac Family History: No early CAD or SCD.     Past Medical History:   Diagnosis Date    ??? Ascites    ??? Asthma    ??? Chronic kidney disease    ??? History of cardiac cath     with 2 stents   ??? Hypertension    ??? Infectious disease    ??? Myocardial infarction [facility](HCC)      History reviewed. No pertinent surgical history.  Social History     Socioeconomic History   ??? Marital status: SINGLE     Spouse name: Not on file   ??? Number of children: Not on file   ??? Years of education: Not on file   ??? Highest education level: Not on file   Social Needs   ??? Financial resource strain: Not on file   ??? Food insecurity - worry: Not on file   ??? Food insecurity - inability: Not on file   ??? Transportation needs - medical: Not on file   ??? Transportation needs - non-medical: Not on file   Occupational History   ??? Not on file   Tobacco Use   ??? Smoking status: Current Every Day Smoker     Packs/day: 0.25   ??? Smokeless tobacco: Never Used   Substance and Sexual Activity   ??? Alcohol use: No     Frequency: Never   ??? Drug use: No   ??? Sexual activity: Not Currently   Other Topics Concern   ??? Not on file   Social History Narrative   ??? Not on file     History reviewed. No pertinent family history.    Allergies   Allergen Reactions   ??? Lisinopril Anaphylaxis   ??? Morphine Anaphylaxis   ??? Toradol [Ketorolac] Anaphylaxis       Home Medications:     Prior to Admission medications    Medication Sig Start Date End Date Taking? Authorizing Provider   hydrALAZINE (APRESOLINE) 100 mg tablet Take 100 mg by mouth three (3) times daily.   Yes Provider, Historical   amLODIPine (NORVASC) 10 mg tablet Take 10 mg by mouth daily.   Yes Provider, Historical   carvedilol (COREG) 12.5 mg tablet Take 12.5 mg by mouth two (2) times daily (with meals).   Yes Provider, Historical   furosemide (LASIX) 80 mg tablet Take 80 mg by mouth daily.   Yes Provider, Historical   sevelamer (RENAGEL) 400 mg tablet Take 800 mg by mouth three (3) times daily (with meals).   Yes Provider, Historical   albuterol (PROVENTIL HFA, VENTOLIN HFA, PROAIR HFA) 90 mcg/actuation  inhaler Take 2 Puffs by inhalation every four (4) hours as needed for Wheezing.   Yes Provider, Historical       Inpt meds:  Current Facility-Administered Medications   Medication Dose Route Frequency       Review  of Systems:   ([place] in [place])  General: neg for chills, diaphoresis, fever, malaise/fatigue  Neurological: neg for HA, seizure, LOC, dizziness, lightheadedness  HEENT: neg for congestion, hearing loss, nose bleeds, sore throat, no vision changes  Cardiovascular: neg for chest pain/pressure, claudication, leg swelling, orthopnea, palpitations  Pulmonary: neg for cough, hemoptysis, SOB/ DOE, sputum production, wheezing  Gastrointestinal: neg for abdominal pain, heartburn, blood in stool, constipation, diarrhea, n/v  Genitourinary: neg for dysuria, hematuria  Integument: neg for rash, non-healing sores??  Physical Assessment:     Visit Vitals  BP (!) 164/101   Pulse (!) 104   Temp 98.2 ??F (36.8 ??C)   Resp 21   Ht 6\' 1"  (1.854 m)   Wt 111.1 kg (245 lb)   SpO2 100%   BMI 32.32 kg/m??     Gen: NAD, chronically ill appearing male  HEENT: Oral mucosa well perfused; conjunctiva not injected  Neck: No JVD, supple  Resp: diminished to auscultation bilaterally; No wheezes or rales  CV: regular tachy rhythm normal s1and s2; No murmurs or rubs, HD port to chest wall   Abd: Soft, tender; distended   Ext: No clubbing, cyanosis or edema  Neuro: Alert and oriented, moves all four extremities  Skin: Warm, Dry, Intact    Labs:  CBC w/Diff  Lab Results   Component Value Date/Time    WBC 6.3 06/17/2017 05:25 AM    RBC 2.59 (L) 06/17/2017 05:25 AM    HCT 24.6 (L) 06/17/2017 05:25 AM    MCV 95.0 06/17/2017 05:25 AM    MCH 30.9 06/17/2017 05:25 AM    MCHC 32.5 06/17/2017 05:25 AM    RDW 16.5 (H) 02/07/2017 07:14 PM     Lab Results   Component Value Date/Time    MONOS 6.6 06/17/2017 05:25 AM    EOS 10.1 (H) 06/17/2017 05:25 AM    BASOS 0.5 06/17/2017 05:25 AM       Basic Metabolic Profile  Lab Results   Component Value Date     NA 138 06/17/2017    NA 140 06/17/2017    CO2 25 06/17/2017    CO2 26 06/17/2017    BUN 57 (H) 06/17/2017    BUN 54 (H) 06/17/2017       Cardiac Enzymes  Lab Results   Component Value Date    CKMB 32.2 (H) 02/07/2017       Thyroid Studies  No results found for: TSH, T4, T4FREE, TSHEXT  No components found for: T3, FREET3, T3UPTAKE, THYROXINBDGL    Coagulation  Lab Results   Component Value Date    INR 1.1 06/17/2017    APTT 33.9 (H) 02/07/2017       No results found for: HDL    Hepatic Function  No results found for: ALBUMIN    Urinalysis  No results found for: UGLU        Thanks,   Lind Guest NP-C  [facility]   June 17, 2017, 8:55 AM

## 2017-06-17 NOTE — H&P (Signed)
History & Physical    Primary Care Provider: Candis Schatz, MD  Source of Information: Patient, Chart review on Epic Care Everywhere      Assessment/Plan:     Abdominal pain/Refractory ascites  Pt with sig abd bloating  Will have pt go for therapeutic paracentesis in the AM 3/2  Pt noted he gets paracentesis q2 weeks  Lab studies for infxn and basic ascitic fluid chem ordered  NPO p MN  Dilaudid for pain    Chest pain/CAD  S/p MI and PCI with stents in 2010  Troponin mildly elevated (0.638)  Cardiology is following.  Echo ordered to eval EF  Continue Coreg  No plans of invasive ischemic eval unless sig changes on Echo.  Medical mgt for now    ESRD on HD  S/p HD on 3/1 with 3L removed    HTN  S/p HD  Continue Coreg, Norvasc.  Pt has hydralazine PRN  Cards suggested to up titrate Coreg if still hypertensive 3/2    Asthma  Nebs PRN    Anemia of renal dz  Hb 8 (3/1)  Transfuse PRBC is Hb <7    OSA on CPAP  Pt may use his machine if he brings it in    C diff recurrent  Pt with diarrhea per pt. Resume Vancomycin PO (3/1)  Check stool for C diff  Contact isolation    Chronic systolic CHF  Fluid mgt per HD  Most recent Echo (02/2017) with LVEF 35% and Grade 2 DD, mild-mod AR, mild mod MR, mod-severe TR, moderate pulm HTN  CxR (3/1) without pulmonary edema    L arm swelling  Will plan on getting PVL of the L arm    Chronic pain syndrome  Pt will be ion Dilaudid 0.37m IV q4h PRN and oxycodone 5-156mPO q4h PRN  His pain should improve after the paracentesis    Pulm HTN  Fluid mgt with HD  Continue CPAP use.    Tobacco dependence.  Smoking cessation education    Hx of LUE AVF infection  Noted on chart review  S/p removal of AVF    Drug seeking behavior  Noted upon chart review    DVT Prophylaxis.  Heparin SQ     Code status. Full code    Disposition  Plan on paracentesis tomorrow  Additional tests as planned.  Appreciate input from Renal, Cards    ------------------     65 minutes were spent on the patient of which more than 50% was spent in coordination of care and counseling (time spent with patient/family face to face, physical exam, reviewing laboratory and imaging investigations, speaking with physicians and nursing staff involved in this patient's care)    ------------------  Physical exam:  General: A+Ox3, NAD, Cooperative  HEENT: Sclera anicteric, PERRL, OM moist, throat clear.  No thrush.  Neck:  No LAD, no bruits, carotid pulses are equal bilaterally.  Back: no spinal or flank tenderness to percussion or palpation  Chest: CTA B, no wheeze, no rales, no rhonchi.  Fair air movement.  CV: RRR, S1/S2 were normal, no s3/s4, no murmur  Abdomen: Mild diffuse abd tenderness, Bloated, + fluid wave, NABS, no masses were noted.  No rebound, no guarding.  No tympany.  Extremities:  1+ edema in the legs.  2+ edema in the L arm.  No pitting or cyanosis.  Radial, dorsalis pedis and anterior tibial pulses were 2+ bilaterally.  Neuro: No focal neurological deficits were noted, cranial nerves 2-12  were intact.    History of Presenting Illness:     Pt is a 47 yo man with a PMH sig for ESRD on HD, recurrent ascites, HTN, asthma, tobacco use, anemia of renal dz, OSA, chronic systolic CHF who presented with SOB, CP, Abd pain. Pt also noted that he had to cut short the HD on Wed since he had episodes of N/V. He noted he has been having issues with poor PO and unable to keep anything down since last Friday (8 days prior). He noted he took a SL NTG at home when he had CP but it did not help. Pt noted a hx of recurrent ascites for which he gets large volume paracentesis about every 2 weeks. He noted it's been a bit over 2 weeks since his last one. He noted he usually gets his care at [facility], but happened to be in the area visiting family. He noted that he will be moving to the area soon. He noted that he feels a little better after the  hemodialysis. Pt noted he still has some CP and dilaudid works well. He said that the Dilaudid given earlier (0.31m) seemed to work.  Per Epic review of the chart[facility] [place] [facility] because they would not treat his pain. At that time he also reported he was diagnosed with C diff one month prior to that stay and had similar chest pressue with vomiting. [place] Pt had no other complaints based on a 12-step ROS          Past Medical History:   Diagnosis Date   ??? Ascites    ??? Asthma    ??? Chronic kidney disease    ??? History of cardiac cath     with 2 stents   ??? Hypertension    ??? Infectious disease    ??? Myocardial infarction ([facility]       History reviewed. No pertinent surgical history.  Prior to Admission medications    Medication Sig Start Date End Date Taking? Authorizing Provider   hydrALAZINE (APRESOLINE) 100 mg tablet Take 100 mg by mouth three (3) times daily.   Yes Provider, Historical   amLODIPine (NORVASC) 10 mg tablet Take 10 mg by mouth daily.   Yes Provider, Historical   carvedilol (COREG) 12.5 mg tablet Take 12.5 mg by mouth two (2) times daily (with meals).   Yes Provider, Historical   sevelamer (RENAGEL) 400 mg tablet Take 800 mg by mouth three (3) times daily (with meals).   Yes Provider, Historical   albuterol (PROVENTIL HFA, VENTOLIN HFA, PROAIR HFA) 90 mcg/actuation inhaler Take 2 Puffs by inhalation every four (4) hours as needed for Wheezing.   Yes Provider, Historical     Allergies   Allergen Reactions   ??? Lisinopril Anaphylaxis   ??? Morphine Anaphylaxis   ??? Toradol [Ketorolac] Anaphylaxis      History reviewed. No pertinent family history.   Social History     Socioeconomic History   ??? Marital status: SINGLE     Spouse name: Not on file   ??? Number of children: Not on file   ??? Years of education: Not on file   ??? Highest education level: Not on file    Social Needs   ??? Financial resource strain: Not on file   ??? Food insecurity - worry: Not on file   ???  Food insecurity - inability: Not on file   ??? Transportation needs - medical: Not on file   ??? Transportation needs - non-medical: Not on file   Occupational History   ??? Not on file   Tobacco Use   ??? Smoking status: Current Every Day Smoker     Packs/day: 0.25   ??? Smokeless tobacco: Never Used   Substance and Sexual Activity   ??? Alcohol use: No     Frequency: Never   ??? Drug use: No   ??? Sexual activity: Not Currently   Other Topics Concern   ??? Not on file   Social History Narrative   ??? Not on file       Objective:     Physical Exam:     Visit Vitals  BP (!) 156/94   Pulse 98   Temp 98.1 ??F (36.7 ??C)   Resp 17   Ht '6\' 1"'  (1.854 m)   Wt 111.1 kg (245 lb)   SpO2 100%   BMI 32.32 kg/m??    O2 Flow Rate (L/min): 3 l/min O2 Device: Nasal cannula    Data Review:     Recent Days:  Recent Labs     06/17/17  0525   WBC 6.3   HGB 8.0*   HCT 24.6*   PLT 105*     Recent Labs     06/17/17  0525   NA 138   140   K 5.2*   5.2*   CL 103   103   CO2 25   26   GLU 88   86   BUN 57*   54*   CREA 9.9*   10.1*   CA 6.2*   6.0*   ALB 2.9*   TBILI 0.4   SGOT 38*   ALT 19   INR 1.1     No results for input(s): PH, PCO2, PO2, HCO3, FIO2 in the last 72 hours.    24 Hour Results:  Recent Results (from the past 24 hour(s))   EKG, 12 LEAD, INITIAL    Collection Time: 06/17/17  4:59 AM   Result Value Ref Range    Ventricular Rate 106 BPM    Atrial Rate 106 BPM    P-R Interval 194 ms    QRS Duration 90 ms    Q-T Interval 364 ms    QTC Calculation (Bezet) 483 ms    Calculated P Axis 2 degrees    Calculated R Axis -19 degrees    Calculated T Axis 54 degrees    Diagnosis       Sinus tachycardia  Possible Left atrial enlargement  Borderline ECG  No previous ECGs available  Confirmed by Oleta Mouse, M.D., Krista Blue ([id number]) on 06/17/2017 4:34:46 PM     CBC WITH AUTOMATED DIFF    Collection Time: 06/17/17  5:25 AM   Result Value Ref Range    WBC 6.3 4.0 - 11.0 1000/mm3     RBC 2.59 (L) 3.80 - 5.70 M/uL    HGB 8.0 (L) 12.4 - 17.2 gm/dl    HCT 24.6 (L) 37.0 - 50.0 %    MCV 95.0 80.0 - 98.0 fL    MCH 30.9 23.0 - 34.6 pg    MCHC 32.5 30.0 - 36.0 gm/dl    PLATELET 105 (L) 140 - 450 1000/mm3    MPV 9.7 6.0 - 10.0 fL    RDW-SD 58.3 (H) 35.1 - 43.9      NRBC 0 0 - 0  IMMATURE GRANULOCYTES 0.2 0.0 - 3.0 %    NEUTROPHILS 76.1 (H) 34 - 64 %    LYMPHOCYTES 6.5 (L) 28 - 48 %    MONOCYTES 6.6 1 - 13 %    EOSINOPHILS 10.1 (H) 0 - 5 %    BASOPHILS 0.5 0 - 3 %   METABOLIC PANEL, BASIC    Collection Time: 06/17/17  5:25 AM   Result Value Ref Range    Sodium 138 136 - 145 mEq/L    Potassium 5.2 (H) 3.5 - 5.1 mEq/L    Chloride 103 98 - 107 mEq/L    CO2 25 21 - 32 mEq/L    Glucose 88 74 - 106 mg/dl    BUN 57 (H) 7 - 25 mg/dl    Creatinine 9.9 (H) 0.6 - 1.3 mg/dl    GFR est AA 7.0      GFR est non-AA 6      Calcium 6.2 (L) 8.5 - 10.1 mg/dl    Anion gap 11 5 - 15 mmol/L   TROPONIN I    Collection Time: 06/17/17  5:25 AM   Result Value Ref Range    Troponin-I 0.638 (HH) 0.000 - 0.045 ng/ml   NT-PRO BNP    Collection Time: 06/17/17  5:25 AM   Result Value Ref Range    NT pro-BNP 100,456.0 (H) 0.0 - 244.0 pg/ml   METABOLIC PANEL, COMPREHENSIVE    Collection Time: 06/17/17  5:25 AM   Result Value Ref Range    Sodium 140 136 - 145 mEq/L    Potassium 5.2 (H) 3.5 - 5.1 mEq/L    Chloride 103 98 - 107 mEq/L    CO2 26 21 - 32 mEq/L    Glucose 86 74 - 106 mg/dl    BUN 54 (H) 7 - 25 mg/dl    Creatinine 10.1 (H) 0.6 - 1.3 mg/dl    GFR est AA 7.0      GFR est non-AA 6      Calcium 6.0 (L) 8.5 - 10.1 mg/dl    AST (SGOT) 38 (H) 15 - 37 U/L    ALT (SGPT) 19 12 - 78 U/L    Alk. phosphatase 240 (H) 45 - 117 U/L    Bilirubin, total 0.4 0.2 - 1.0 mg/dl    Protein, total 7.0 6.4 - 8.2 gm/dl    Albumin 2.9 (L) 3.4 - 5.0 gm/dl    Anion gap 11 5 - 15 mmol/L   PROTHROMBIN TIME + INR    Collection Time: 06/17/17  5:25 AM   Result Value Ref Range    Prothrombin time 13.0 (H) 10.2 - 12.9 seconds    INR 1.1 0.1 - 1.1      HEP B SURFACE AG    Collection Time: 06/17/17  9:30 AM   Result Value Ref Range    Hepatitis B surface Ag NON-REACTIVE NON-REACTIVE     HEP B SURFACE AB    Collection Time: 06/17/17  9:30 AM   Result Value Ref Range    Hepatitis B surface Ab REACTIVE     TROPONIN I    Collection Time: 06/17/17  2:30 PM   Result Value Ref Range    Troponin-I 0.487 (H) 0.000 - 0.045 ng/ml   EKG, 12 LEAD, INITIAL    Collection Time: 06/17/17  2:33 PM   Result Value Ref Range    Ventricular Rate 104 BPM    Atrial Rate 104 BPM    P-R Interval 194  ms    QRS Duration 88 ms    Q-T Interval 358 ms    QTC Calculation (Bezet) 470 ms    Calculated P Axis 69 degrees    Calculated R Axis 1 degrees    Calculated T Axis 90 degrees    Diagnosis       Sinus tachycardia  Possible Left atrial enlargement  Nonspecific T wave abnormality  Abnormal ECG  When compared with ECG of 17-Jun-2017 04:59, (Unconfirmed)  No significant change was found  Confirmed by Oleta Mouse, M.D., Krista Blue ([id number]) on 06/17/2017 4:40:22 PM           Imaging:     Xr Chest Sngl V    Result Date: 06/17/2017  INDICATION: chest pain, sob   EXAMINATION: XR CHEST SNGL V COMPARISON: None FINDINGS: Mild cardiomegaly.. The lungs are clear . Right subclavian stent and dialysis catheter.     IMPRESSION: 1. Mild cardiomegaly. 2. No acute pulmonary process.       echocardiogram    Signed By: Kristen Loader, MD     June 17, 2017

## 2017-06-17 NOTE — ED Notes (Signed)
Bedside and Verbal shift change report given to Matt, rn (oncoming nurse) by Blanca Haladyna, RN (offgoing nurse). Report included the following information SBAR, ED Summary, Procedure Summary, MAR and Recent Results.

## 2017-06-17 NOTE — Other (Signed)
Verify tele with Maisie Fushomas. [address] with hr of 95.[facility].

## 2017-06-17 NOTE — Consults (Signed)
Renal Consult     Patient: David [name] Age: 47 y.o. Sex: male    Date of Birth: 12/01/1969 Admit Date: 06/17/2017 PCP: Jeanell Sparrow, MD   MRN: [id number]  CSN: [id number]       Chief Complaint:  Chief Complaint   Patient presents with   ??? Chest Pain   ??? Shortness of Breath   ??? Diarrhea       Assessment   1. Admitted for left side chest pain.   History of CAD, s/p stent placement.  Per Cardiology.     2. ESRD on HD every MWF at [facility].   Received HD on Wednesday,but treatment was cut short due to diarrhea.   Plan for HD today.   Plan for 3.5 hour of HD with left IJ TDC. Will use 400 ml/min BF.   BP is elevated and he has SOB. Will try UF 3.5 liter today.   Access: TDC    3. Anemia  Due to ESRD  Continue Epo at HD.     4. HTN/Vol:  Vol overload.   BP is higher than the goal.   Patient reported of not taking his BP medications regularly this week due to N/V.     5. Electrolytes-Acid/base:   K at 5.3, will improve with dialysis.     6. Chronic ascites  Patient said he needs paracentesis every 2 weeks.     7. Recent C-diff colitis.    8. Secondary hyperparathyroidism  Patient on Renvela 1 tab TID WM.       Plan   1. Arrange for HD today. Plan for 3.5 hours with goal UF of 3500 ml. Will use 400 ml/min blood flow.   2. Monitor BP after dialysis. Need to resume home BP medications   3. Continue Epo at dialysis.   4. Continue Renvela 1 tab TID WM.   5. Chest pain management per cardiology   6. Continue treatment for C-diff. Per IM.     Thank you for asking me to assist in this patient's care. Please call [phone] at any time with questions.   ??  Will follow.    Patient Active Problem List   Diagnosis Code   ??? Chest pain R07.9   ??? CAD (coronary artery disease) I25.10   ??? ESRD (end stage renal disease) (HCC) N18.6   ??? HTN (hypertension) I10   ??? Ascites R18.8   ??? Diarrhea R19.7   ??? Nausea & vomiting R11.2   ??? HTN (hypertension), malignant I10   ??? Coronary syndrome, acute (HCC) I24.9       History of Present Illness     I was asked by Dr. Manson Passey to see this patient in consult. David [name] is a 47 y.o. year old male [place] who presents with left side chest pain, which radiate to the left am and jaw since yesterday. He was diagnosed with C-diff colitis since last week and has been on treatment. He still has multiple episodes of diarrhea.     David [name] is with established history of ESRD and is on HD every MWF at [facility] [place]. He has been on dialysis for 7 years per him. He had a LUE AVF, which did not work, he currently has a LIJ TDC. He still has significant left arm edema. He received HD on Wednesday, but HD had to be cut short due to his diarrhea.     David [name] other medical history include HTN, asthma,  CAD, s/p stent x2, Recurrent ascites, requiring paracentesis every 2 weeks.     Review of Systems   Constitutional: Negative for fever and chills.   Skin: Negative for rash or skin lesions.   Eyes: Sclera clear  Cardiovascular: has chest pain,exertional dyspnea and palpitations.   Respiratory: Negative for cough and hemoptysis. Has shortness of breath. No orthopnea or pnd.   Gastrointestinal: has nausea and vomiting and diarrhea. Has abd distention.    Genitourinary: HD patient. Does not make much urine. Negative for dysuria,blood in the urine,frequency or burning on micturition. No difficulty with initiating urination.  Musculoskeletal: Negative  for arthralgias,back pain or neck pain.  Neurological: Negative for dizziness or syncopal episodes   Psychiatric: Negative.     Past Medical/Psychiatric/Surgical History     Past Medical History:   Diagnosis Date   ??? Ascites    ??? Asthma    ??? Chronic kidney disease    ??? History of cardiac cath     with 2 stents   ??? Hypertension    ??? Infectious disease    ??? Myocardial infarction [facility](HCC)      History reviewed. No pertinent surgical history.  History reviewed. No pertinent surgical history.     Social History     Social History     Socioeconomic History    ??? Marital status: SINGLE     Spouse name: Not on file   ??? Number of children: Not on file   ??? Years of education: Not on file   ??? Highest education level: Not on file   Tobacco Use   ??? Smoking status: Current Every Day Smoker     Packs/day: 0.25   ??? Smokeless tobacco: Never Used   Substance and Sexual Activity   ??? Alcohol use: No     Frequency: Never   ??? Drug use: No   ??? Sexual activity: Not Currently     Social History     Substance and Sexual Activity   Sexual Activity Not Currently     Social History     Tobacco Use   Smoking Status Current Every Day Smoker   ??? Packs/day: 0.25   Smokeless Tobacco Never Used     Social History     Substance and Sexual Activity   Drug Use No      Social History     Substance and Sexual Activity   Alcohol Use No   ??? Frequency: Never     Family History   History reviewed. No pertinent family history.    Medications     Current Medications:  Current Facility-Administered Medications   Medication Dose Route Frequency Provider Last Rate Last Dose   ??? nitroglycerin (NITROSTAT) tablet 0.4 mg  0.4 mg SubLINGual PRN Docia FurlBrown, Neil G, MD   0.4 mg at 06/17/17 0657   ??? albumin human 25% (BUMINATE) solution 25 g  25 g IntraVENous PRN Fabio AsaHuang, Makinsey Pepitone E., MD       ??? sodium chloride 0.9 % bolus infusion 500 mL  500 mL IntraVENous PRN Fabio AsaHuang, Maryjo Ragon E., MD       ??? HYDROmorphone (PF) (DILAUDID) injection 0.5 mg  0.5 mg IntraVENous NOW Docia FurlBrown, Neil G, MD       ??? heparin (porcine) 1,000 unit/mL injection 4,400 Units  4,400 Units Hemodialysis Q MON, WED & Rosario JacksFRI Blanchie Zeleznik E., MD         Current Outpatient Medications   Medication Sig Dispense Refill   ??? hydrALAZINE (APRESOLINE) 100 mg  tablet Take 100 mg by mouth three (3) times daily.     ??? amLODIPine (NORVASC) 10 mg tablet Take 10 mg by mouth daily.     ??? carvedilol (COREG) 12.5 mg tablet Take 12.5 mg by mouth two (2) times daily (with meals).     ??? furosemide (LASIX) 80 mg tablet Take 80 mg by mouth daily.      ??? sevelamer (RENAGEL) 400 mg tablet Take 800 mg by mouth three (3) times daily (with meals).     ??? albuterol (PROVENTIL HFA, VENTOLIN HFA, PROAIR HFA) 90 mcg/actuation inhaler Take 2 Puffs by inhalation every four (4) hours as needed for Wheezing.         Prior to Admission Medications:    (Not in a hospital admission)    Allergies     Allergies   Allergen Reactions   ??? Lisinopril Anaphylaxis   ??? Morphine Anaphylaxis   ??? Toradol [Ketorolac] Anaphylaxis       Physical Exam     Visit Vitals  BP (!) 192/101   Pulse (!) 107   Temp 97.9 ??F (36.6 ??C)   Resp 20   Ht 6\' 1"  (1.854 m)   Wt 111.1 kg (245 lb)   SpO2 100%   BMI 32.32 kg/m??     GEN:  AAOX3, NAD  HEENT: sclerae anicteric, conjunctivae WNL   NECK: No JVD,Trachea midline. No thyromegally  LYMPHORETICULAR:  No lymphadenopathy.   LUNGS: scattered crackles at bilateral base.   CVS: Heart RRR, No S4 or S3 Gallop, No Pericardial Rub.  CHEST WALL: TDC noted  ABD: Soft, distended, Non Tender, Bowel Sounds normal.  EXTR: Left arm edema. Trace bilateral leg edema.    SKIN: Skin without rashes, NL turgor  CNS: Speech normal, alert and oriented, easily falls into sleep during interviewing.     Laboratory:     CBC w/Diff  Lab Results   Component Value Date/Time    WBC 6.3 06/17/2017 05:25 AM    RBC 2.59 (L) 06/17/2017 05:25 AM    HCT 24.6 (L) 06/17/2017 05:25 AM    MCV 95.0 06/17/2017 05:25 AM    MCH 30.9 06/17/2017 05:25 AM    MCHC 32.5 06/17/2017 05:25 AM    RDW 16.5 (H) 02/07/2017 07:14 PM     Lab Results   Component Value Date/Time    MONOS 6.6 06/17/2017 05:25 AM    EOS 10.1 (H) 06/17/2017 05:25 AM    BASOS 0.5 06/17/2017 05:25 AM    RDW 16.5 (H) 02/07/2017 07:14 PM       Recent Labs     06/17/17  0525   GLU 88   86   NA 138   140   K 5.2*   5.2*   CL 103   103   CO2 25   26   BUN 57*   54*   CREA 9.9*   10.1*   CA 6.2*   6.0*   AGAP 11   11   AP 240*   TP 7.0   ALB 2.9*   ALT 19   SGOT 38*          Thankyou for asking me to assist in this patient's care.  Will follow.     Fabio Asa, MD,   11:20 AM ,06/17/2017  [facility],  [phone] (Office) Call at any time  [phone] (Pager) Call before 4:30 pm

## 2017-06-17 NOTE — Other (Signed)
.  Acute HEmodialysis flow sheet   Patient information           Name:David [name]         MRN: [id number]      AVW:[id number]  TX# Room#OTF/OTF          Hospital: [facility] Care DX: chest pain           [] 1st Time Acute  [] Stat[x]  Routine [] Urgent [] Chronic Unit          [x] Acute Room [] Bedside  [] ICU/CCU [] ER         Isolation Precautions: [x] Dialysis[]  Airborne [x] Contact [] Droplet [] Reverse C-diff         Special Considerations:    []  Blood Consent Verified  [x] N/A         Allergies:  []  NKA  [x]  Reviewed  Allergies   Allergen Reactions   ??? Lisinopril Anaphylaxis   ??? Morphine Anaphylaxis   ??? Toradol [Ketorolac] Anaphylaxis      Code Status [x] Full Code []  DNR  [] Other        Diet: see chart Diabetic: [x] Yes [] No      Hemodialysis Orders         Physician: Dr. Renaldo Reel    Dialyzer Revaclear 300 Duration 3.5 BFR: 400 DFR:600    Dialysate: 2 K  CA 2.5 Na 140 Bicarb 35        Dry Weight: UF Goal: 3000 ml         Heparin:  [] Bolus   Units    [] Hourly  Units      [x] None       BP Tx:  [] NS  238ml/Bolus    [] Other     [x] N/A       Labs: Pre:  Post: [x] N/A             Additional Orders: [x] N/A          [x] Signed Treatment Consent   [x]  Verified   []  Obtained    Primary Nurse Report: First initial/ Last Name/Title        Pre Dialysis: Lindell Spar, RN   Time: 0830    Access   CATHETER ACCESS:  []  N/A  []  RIGHT  [x]  LEFT  []  IJ  [x]  SUBCL []  FEM                          []  First use X-ray  [x]  Tunnel     []  Non-Tunneled                          [x]  No S/S infection  []  Redness []  Drainage  []  Cultured []  Swelling                         []  Pain                          [x]  Medical Aseptic []  Prep Dressing Changed                          []  Clotted []  Patent [x]       Flows: [x]  Good []  Poor []  Reversed             If Access Problem Dr. Truitt Merle: []  Yes []  No    Date:    [x]  N/A  GRAFT/FISTULA ACCESS:  [x]  N/A  []  RIGHT  []  LEFT  []  UE  []  LE                            []  AVG  []  AVF []  BUTTONHOLE  []  +BRUIT/THRILL       []  MEDICAL ASEPTIC PREP   []  No S/S infection  []  Redness []  Drainage  []  Cultured []  Swelling                         []  Pain                          Needle Gauge                             Length:     If Access Problem Dr. Truitt Merle: []  Yes []  No    Date:     [x]  N/A   General Assessment        LUNGS:  SaO2%  []  Clear []  Coarse [x]  Crackles []  Wheezing               []  Diminished Location: []  RLL []  LLL []  RUL []  LUL         COUGH:  []  Productive []  Dry [x]  N/A  RESPIRATIONS: []  Easy [x]  Labored        THERAPY: []  RA   [x]  NC   4 L/min    Mask: []  NRB []  Venti  O2%                  []  Ventilator []  Intubated []  Trach []  BiPap []  CPap       CARDIAC: [x]  Regular []  Irregular []  Pericardial Rub []  JVD                []  Monitored Rhythm:        EDEMA: []  None [x]  Generalized []  Facial []  Pedal []  UE []  LE             []  Pitting []  1 []  2 []  3 []  4    []  Right []  Left []  Bilateral       SKIN:    [x]  Warm []  Hot []  Cold  [x]  Dry []  Pale []  Diaphoretic              []  Flushed []  Jaundiced []  Cyanotic []  Rash []  Weeping         LOC:    [x]  Alert  Oriented to: [x]  Person [x]  Place [x]  Time             []  Confused []  Lethargic []  Medically sedated []  Non-responsive        GI/ABDOMEN: []  Flat []  Distended [x]  Soft []  Firm []  Obese []  Diarrhea   []  Bowel Sounds     [] Nausea []  Vomiting         PAIN: []  0 []  1 []  2 [x]  3 []  4 []  5 []  6 []  7 []  8 []  9 []  10          Scale 1-10 Action/Follow Up        MOBILITY: []  Amb []  Amb/Assist [x]  Bed  []  Wheelchair    CUrrent LABS   HBsAg ONLY: Date Drawn 06/17/17 [x]  Negative []  Positive  []  Unknown.  HBsAb: Date Drawn 06/17/17 []  Susceptible <10 [x]  Immune ?10 []  Unknown       Date of Current Labs:        Lab Results   Component Value Date/Time    Sodium 138 06/17/2017 05:25 AM    Sodium 140 06/17/2017 05:25 AM    Potassium 5.2 (H) 06/17/2017 05:25 AM    Potassium 5.2 (H) 06/17/2017 05:25 AM     Chloride 103 06/17/2017 05:25 AM    Chloride 103 06/17/2017 05:25 AM    CO2 25 06/17/2017 05:25 AM    CO2 26 06/17/2017 05:25 AM    BUN 57 (H) 06/17/2017 05:25 AM    BUN 54 (H) 06/17/2017 05:25 AM    Creatinine 9.9 (H) 06/17/2017 05:25 AM    Creatinine 10.1 (H) 06/17/2017 05:25 AM    Calcium 6.2 (L) 06/17/2017 05:25 AM    Calcium 6.0 (L) 06/17/2017 05:25 AM    Magnesium 1.9 02/06/2017 03:31 PM    Phosphorus 5.3 (H) 02/06/2017 03:31 PM    HCT 24.6 (L) 06/17/2017 05:25 AM    HGB 8.0 (L) 06/17/2017 05:25 AM    PLATELET 105 (L) 06/17/2017 05:25 AM    INR 1.1 06/17/2017 05:25 AM      Education        Person Educated: [x]  Patient []  Family []  Other         Knowledge base: []  None [x]  Minimal []  Substantial         Barriers to learning  [x]  None         Preferred method of learning: []  Written [x]  Oral []  Visual []  Hands on        Topic: []  Access Care []  S&S of infection []  Fluid Management []  K+                 [x]  Procedural    []  Albumin []  Medications []  Tx Options []  Transplant                  []  Diet []  Other         Teaching Tools: [x]  Explain []  Demonstration []  Handout []  Teachback   Ro/hemodiaylsis machine safety checks- before each treatment   Machine Serial #   [x]  # 1 [id number]   []  # 2 [id number]    []  # 3 [id number]   []  # 4 [id number]   []  # 5 [id number]   []  # 6 [id number]   []  # 7 [id number]      Alarm Test: [x]  Pass Time      RO Serial #   []  [id number] (Large dual station RO)  [x]  # 1  [id number]  (Portable # 1)  []  # 2  [id number]  (Portable # 2)  []  # 3  [id number]  (Portable # 3)  []  # 4  [id number]  (Portable # 4)  []  # 5  [id number]  (Portable # 5)       Lot #s: Dialyzer [id number] exp. 04-27-2020 Tubing [id number] exp. 11-15-2021  Saline [id number]  Exp. May 20   [x]  RO/Machine Log Complete    [x]  Extracorporeal circuit Tested for integrity       Dialysate: pH 7.4 Temp. 36  Conductivity: Meter  14 HD Machine  14.3   chlorine testing- before each treatment and every 4 hours        Total Chlorine: [x]  Less than 0.1 ppm Time:  0830 2nd Check Time: 1230   (If greater than 0.1 ppm  from Primary then every 30 minutes from Secondary)   treatment Iniation-with dialysis precautions   [x]  All Connections Secured   [x]  Saline Line Double Clamped     [x]  Venous Parameters Set    [x]  Arterial Parameters Set    [x]  Prime Given 250 ml            [x]  Air Foam Detector Engaged        [facility] Nurse Signature/Title_Arrianne Berline ChoughRigby, RN__    Pre-Treatment   Time 0857 B/P 152/75 HR 101  Temp. 97.9 Resp Rate 20  Pre Wt   UF Calculations:   Wt to lose:3000 ml(+) Oral:  ml(+) IV Meds/Fluids/Blood prods  ml(+)  Prime/Rinse 500ml  (=)Total UF Goal 3500 mL   Scale Type:[]  Bed scale []  Sling Scale []  Wheel Chair Scale [x]   Not Ordered []  Unable to obtain pt on stretcher   Tx Initiation Note: HD started using L subclavian TDC, access patent, good blood flow, aspirated and flushes well. Pt alert and oriented, verbally responsive. Per ED nurse, patient + for CDIFF, precaution started,    [x]  Time Out/Safety Check  Time: 0845    [facility] Signature/Title_Arrianne Berline Choughigby, RN   INTRADIALYTIC MONITORING  (SEE ATTACHED FLOWSHEET)   POST TREATMENT    Time 1237 B/P 176/98 HR 107 Temp 97.8  Resp.  Rate 20 Post wt    Time Medication Dose Volume Route Initials                                   [facility] Signatures Title Initials Time   Layla MawArrianne Rigby, RN   RN                 [facility] Signature/Title:_Arrianne Berline Choughigby, RN_                                  Dialyzer cleared: [x]  Good []  Fair []  Poor    Blood Processed 64.6 Liters            Net UF Removed 3500 mL    Post Tx Access:    AVF/AVG: Bleeding Stop                   Art. min Venmin [] +bruit/thrill                             Catheter: Locking Solution heparin 1000u     Art. 2.2 ml Ven 2.2  ml    Post Assessment:  Skin: [x] Warm [x] Dry [] Diaphoretic [] Flushed []  Pale [] Cyanotic          Lungs: [x] Clear [] Coarse [] Crackles [] Wheezing           Cardiac: [x] Regular [] Irregular  [] Monitored rhythm          Edema: [] None [x] General [] Facial [] Pedal  [] UE [] LE [] RIGHT                      [] LEFT    [] Bilateral      Pain: [x] 0 [] 1[] 2 [] 3 [] 4 [] 5 [] 6 [] 7 [] 8 [] 9 [] 10   POST Tx Note: HD completed, removed 3500ml of fluid as ordered, pt remains alert and oriented, verbally responsive, report given to ER nurse.    Primary Nurse Report: First initial/Last name/Title    Post Dialysis:_M. Graciela HusbandsKlein, RN  Time:_ 1240   Abbreviations: AVG-arterial venous graft, AVF-arterial venous fistula, IJ-Internal Jugular,  Subcl-Subclavian, Fem-Femoral, Tx-treatment, AP/HR-apical heart rate, DFR-dialysate flow rate, BFR-blood flow rate, AP-arterial pressure, VP-venous pressure, UF-ultrafiltrate, TMP-transmembrane pressure, Ven-Venous, Art-Arterial, RO-Reverse Osmosis

## 2017-06-17 NOTE — ED Notes (Signed)
Dr Brown at bedside evaluating patient

## 2017-06-17 NOTE — Progress Notes (Addendum)
1335: D.r Liu cardiologist in to see the patient, informed about chest pain 10/10, pt refuses nitro, requested dilaudid,no new orders Dr. Manson PasseyBrown made aware, no new orders obtained.  Per D.r Verdie MosherLiu , will obtain 1 troponin. Will control BP, pain control per ER   1348: Echo at the bedside1430: called Dr. Maryelizabeth KaufmannMunoz informed about pt status, c/o chest pain, 10/10, no new orders obtained  [id number]: pt is still c/o chest pain, arch wirelessed to Dr. Maryelizabeth KaufmannMunoz.awaiting for orders  Pt is refusing hydralazine  1750:Patient is eating dinner, requesting for pain medications, paged Dr. Maryelizabeth KaufmannMunoz again  1800: Dr. Maryelizabeth KaufmannMunoz at the bedside,   Pt medicated,   1830 : TRANSFER - OUT REPORT:    Verbal report given to Krystal,RN(name) on David [name]  being transferred to [facility](unit) for routine progression of care       Report consisted of patient???s Situation, Background, Assessment and   Recommendations(SBAR).     Information from the following report(s) ED Summary was reviewed with the receiving nurse.    Lines:   Peripheral IV (Active)   Site Assessment Clean, dry, & intact 06/17/2017  5:25 AM   Phlebitis Assessment 0 06/17/2017  5:25 AM   Infiltration Assessment 0 06/17/2017  5:25 AM   Dressing Status Clean, dry, & intact 06/17/2017  5:25 AM       Peripheral IV 06/17/17 Right;Mid Forearm (Active)   Site Assessment Clean, dry, & intact 06/17/2017  1:00 PM   Phlebitis Assessment 0 06/17/2017  1:00 PM   Infiltration Assessment 0 06/17/2017  1:00 PM   Dressing Status Clean, dry, & intact 06/17/2017  1:00 PM   Dressing Type Transparent 06/17/2017  1:00 PM   Hub Color/Line Status Patent 06/17/2017  1:00 PM   Action Taken Open ports on tubing capped 06/17/2017  1:00 PM   Alcohol Cap Used Yes 06/17/2017  1:00 PM        Opportunity for questions and clarification was provided.      Patient transported with:   Registered Nurse

## 2017-06-17 NOTE — Progress Notes (Signed)
Two nurses at bedside educating patient on epogen. Patient refused medication.

## 2017-06-17 NOTE — Progress Notes (Signed)
DR Langston MaskerMorris returned page, new order for cpap placed.On coming nurse to follow up with Dr Maryelizabeth KaufmannMunoz in am.

## 2017-06-17 NOTE — Progress Notes (Signed)
Hospitalist on call arch wireless to make aware patient refused his epogen, had second nurse do teaching with patient, patient still refuses. Patient requesting cpap machine. Patient still has chest discomfort, refused nitro, received his dilaudid 0.5mg  iv.

## 2017-06-17 NOTE — ED Notes (Signed)
PICC team at bedside

## 2017-06-17 NOTE — ED Notes (Signed)
Pt sleeping, awaits provider eval,

## 2017-06-17 NOTE — Consults (Signed)
Consults by Haydee Salter, MD  at 06/17/17 1150                Author: Haydee Salter, MD  Service: Cardiology  Author Type: Physician       Filed: 06/17/17 1537  Date of Service: 06/17/17 1150  Status: Addendum          Editor: Haydee Salter, MD (Physician)          Related Notes: Original Note by Devoria Albe, NP (Nurse Practitioner) filed at 06/17/17  1150            Consult Orders        1. CONSULT TO PHYSICIAN [[id number]] ordered by Docia Furl, MD at 06/17/17 [phone]                                      [facility] Consultation Note      Referring Physician: Docia Furl, MD    Outpatient Cardiologist: None   PCP: Jeanell Sparrow, MD    DOA: 06/17/2017       David [name] is a 47 y.o. male who is being seen on consult for elevated troponin/HTN .        Impression:     Cp w/ Elevated Troponin    -Troponin 0.638    -EKG, personally reviewed     -CXR 06/17/2017: Mild cardiomegaly.The lungs are clear. Right subclavian stent and dialysis catheter.   HTN Urgency   C Diff, acute   N/V w/ Ascites, acute     -recurrent paracentesis in past   CAD    -s/p MI and PCI with stents x 2 in 2010    ESRD on HD   Tobacco Abuse, ongoing    Hx of LUE AVF infection   Chronic Respiratory failure, per chart review    Hx of Medication Noncompliance, per chart review   Drug Seeking Behavior, per chart review    Not on ASA d/t Bleeding Issues        Recommendations:     1.  Check Echo for EF and WMA   2.  Now s/p HD with 3 L removal, will need Paracentesis for large ascites, continue volume optimization per renal   3.  Restart home coreg 12.5mg  BID and norvasc 10 mg. IV hydralazine PRN SBP >160. Would uptitrate Coreg in AM if remains hypertesive   4.  Mild Trop and chest pressure 2/2 severe fluid overload, and HTN emergency, not likely primary plaque rupture process.    5.  Not anticipating invasvie ischemic evaluation at this time unless significant changes on echo. Continue medical management of CAD.          CARDIOLOGY  ATTENDING      Patient independently seen and examined; The relevant hemodynamic, ECG/tracing, laboratory, radiographic data, and cardiac imaging were personally reviewed by me. The PA's documentation was reviewed and I have integrated my findings, assessment and plan  in attached note.          Haydee Salter, MD, PhD, RPVI, [facility]   [facility]   Pager Pager: [phone]   06/17/2017         Admission diagnosis: C diff Colitis     Patient Active Problem List        Diagnosis  Code         ?  Chest pain  R07.9     ?  CAD (coronary artery disease)  I25.10     ?  ESRD (end stage renal disease) (HCC)  N18.6     ?  HTN (hypertension)  I10     ?  Ascites  R18.8     ?  Diarrhea  R19.7     ?  Nausea & vomiting  R11.2     ?  HTN (hypertension), malignant  I10         ?  Coronary syndrome, acute (HCC)  I24.9        HPI: David [name]  is a 47 y.o. male with PMH significant  for as stated above who presented to [facility] ED on 06/17/2017 with complaints of L sided chest pain radiating to L arm and jaw since 4 pm that has grown progressively worse. He reports being diagnosed with CDIFF last Friday which is not being treated and he  continues to have diarrhea everyday. He also endorses N/V since last Friday and unable to keep anything down, he had to cut HD short on Wednesday d/t recurrent Vomiting. He took SL nitro at home when he had CP and it did not help. He also endorses growing  abd distention secondary to ascites which he frequently gets paracentesis.    While in the ED, his BP was 169/123,  his proBNP was >100,000, his troponin was elevated his ekg was non ischemic, his CXR was cardiomegaly no acute processes, his K was elevated at 5.2, and his Cr was 9.9. He was admitted and in HD this am. His  CP in the am was nonexertional, did not relieve with nitro times three but did resolve with dilaudid.     Per care everywhere patient was in [facility] in [place] on 05/16/2017 and left AMA because they would not  treat his pain. At that time he also reported he was diagnosed with CDIFF one month prior to that stay and had similar chest pressue  with vomiting. Appears patient visits many ED's in [place] [facility], [place] for these similar issues.     Currently patient denies active chest pain or pressure, dyspnea, PND, orthopnea, palpitations, syncope, edema,  nausea/vomiting, and diaphoresis.       Significant Social History: He reports being current everyday smoker, 0.25 ppd, denies alcohol or illicit drug use.       Significant Cardiac Family History: No early CAD or SCD.         Past Medical History:        Diagnosis  Date         ?  Ascites       ?  Asthma       ?  Chronic kidney disease       ?  History of cardiac cath            with 2 stents         ?  Hypertension       ?  Infectious disease           ?  Myocardial infarction [facility])          History reviewed. No pertinent surgical history.     Social History          Socioeconomic History         ?  Marital status:  SINGLE              Spouse name:  Not on file         ?  Number of children:  Not on file     ?  Years of education:  Not on file     ?  Highest education level:  Not on file       Social Needs         ?  Financial resource strain:  Not on file     ?  Food insecurity - worry:  Not on file     ?  Food insecurity - inability:  Not on file     ?  Transportation needs - medical:  Not on file     ?  Transportation needs - non-medical:  Not on file       Occupational History        ?  Not on file       Tobacco Use         ?  Smoking status:  Current Every Day Smoker              Packs/day:  0.25         ?  Smokeless tobacco:  Never Used       Substance and Sexual Activity         ?  Alcohol use:  No              Frequency:  Never         ?  Drug use:  No     ?  Sexual activity:  Not Currently        Other Topics  Concern        ?  Not on file       Social History Narrative        ?  Not on file        History reviewed. No pertinent family history.         Allergies        Allergen  Reactions         ?  Lisinopril  Anaphylaxis     ?  Morphine  Anaphylaxis         ?  Toradol [Ketorolac]  Anaphylaxis             Home Medications:          Prior to Admission medications             Medication  Sig  Start Date  End Date  Taking?  Authorizing Provider            hydrALAZINE (APRESOLINE) 100 mg tablet  Take 100 mg by mouth three (3) times daily.      Yes  Provider, Historical     amLODIPine (NORVASC) 10 mg tablet  Take 10 mg by mouth daily.      Yes  Provider, Historical            carvedilol (COREG) 12.5 mg tablet  Take 12.5 mg by mouth two (2) times daily (with meals).      Yes  Provider, Historical            furosemide (LASIX) 80 mg tablet  Take 80 mg by mouth daily.      Yes  Provider, Historical     sevelamer (RENAGEL) 400 mg tablet  Take 800 mg by mouth three (3) times daily (with meals).      Yes  Provider, Historical            albuterol (PROVENTIL HFA, VENTOLIN HFA, PROAIR HFA) 90 mcg/actuation inhaler  Take 2 Puffs by inhalation every four (4) hours as needed for Wheezing.      Yes  Provider, Historical           Inpt meds:     Current Facility-Administered Medications          Medication  Dose  Route  Frequency             Review of Systems:     ([place] in [place])   General: neg for chills, diaphoresis, fever, malaise/fatigue   Neurological: neg for HA, seizure,  LOC, dizziness, lightheadedness   HEENT: neg for congestion, hearing loss, nose bleeds, sore throat, no vision changes   Cardiovascular: neg for chest pain/pressure,  claudication, leg swelling, orthopnea, palpitations   Pulmonary: neg for cough, hemoptysis, SOB/ DOE , sputum production, wheezing   Gastrointestinal: neg for abdominal pain , heartburn, blood in stool, constipation, diarrhea, n/v   Genitourinary: neg for dysuria, hematuria   Integument: neg for rash, non-healing sores??     Physical Assessment:        Visit Vitals      BP  (!) 164/101     Pulse  (!) 104     Temp  98.2 ??F (36.8 ??C)      Resp  21     Ht  6\' 1"  (1.854 m)     Wt  111.1 kg (245 lb)     SpO2  100%        BMI  32.32 kg/m??        Gen: NAD, chronically ill appearing male   HEENT: Oral mucosa well perfused; conjunctiva not injected   Neck: No JVD, supple   Resp: diminished to auscultation bilaterally; No wheezes or rales   CV: regular tachy rhythm normal s1and s2; No murmurs or rubs, HD port to chest wall    Abd: Soft, tender; distended    Ext: No clubbing, cyanosis or edema   Neuro: Alert and oriented, moves all four extremities   Skin: Warm, Dry, Intact      Labs:   CBC w/Diff     Lab Results         Component  Value  Date/Time            WBC  6.3  06/17/2017 05:25 AM       RBC  2.59 (L)  06/17/2017 05:25 AM       HCT  24.6 (L)  06/17/2017 05:25 AM       MCV  95.0  06/17/2017 05:25 AM            MCH  30.9  06/17/2017 05:25 AM            MCHC  32.5  06/17/2017 05:25 AM            RDW  16.5 (H)  02/07/2017 07:14 PM          Lab Results         Component  Value  Date/Time            MONOS  6.6  06/17/2017 05:25 AM       EOS  10.1 (H)  06/17/2017 05:25 AM            BASOS  0.5  06/17/2017 05:25 AM           Basic Metabolic Profile     Lab Results         Component  Value  Date  NA  138  06/17/2017       NA  140  06/17/2017       CO2  25  06/17/2017       CO2  26  06/17/2017       BUN  57 (H)  06/17/2017            BUN  54 (H)  06/17/2017           Cardiac Enzymes     Lab Results         Component  Value  Date            CKMB  32.2 (H)  02/07/2017              Thyroid Studies   No results found for: TSH, T4, T4FREE, TSHEXT   No components found for: T3, FREET3, T3UPTAKE, THYROXINBDGL      Coagulation     Lab Results         Component  Value  Date            INR  1.1  06/17/2017            APTT  33.9 (H)  02/07/2017           No results found for: HDL      Hepatic Function   No results found for: ALBUMIN      Urinalysis   No results found for: UGLU            Thanks,    Lind Guest NP-C   [facility]    June 17, 2017, 8:55 AM

## 2017-06-17 NOTE — Consults (Signed)
Consults by  Fabio Asa., MD at 06/17/17 1120                Author: Fabio Asa., MD  Service: Nephrology  Author Type: Physician       Filed: 06/17/17 1256  Date of Service: 06/17/17 1120  Status: Signed          Editor: Fabio Asa., MD (Physician)            Consult Orders        1. CONSULT TO PHYSICIAN [[id number]] ordered by Docia Furl, MD at 06/17/17 [phone]                                         Renal Consult           Patient: David [name]  Age: 47 y.o.  Sex: male          Date of Birth: 12/01/1969  Admit Date: 06/17/2017  PCP: Jeanell Sparrow, MD     MRN: [id number]   CSN: [id number]           Chief Complaint:     Chief Complaint       Patient presents with        ?  Chest Pain     ?  Shortness of Breath        ?  Diarrhea             Assessment     1. Admitted for left side chest pain.    History of CAD, s/p stent placement.   Per Cardiology.       2. ESRD on HD every MWF at [facility].    Received HD on Wednesday,but treatment was cut short due to diarrhea.    Plan for HD today.    Plan for 3.5 hour of HD with left IJ TDC. Will use 400 ml/min BF.    BP is elevated and he has SOB. Will try UF 3.5 liter today.    Access: TDC      3. Anemia   Due to ESRD   Continue Epo at HD.       4. HTN/Vol:   Vol overload.    BP is higher than the goal.    Patient reported of not taking his BP medications regularly this week due to N/V.       5. Electrolytes-Acid/base:    K at 5.3, will improve with dialysis.       6. Chronic ascites   Patient said he needs paracentesis every 2 weeks.       7. Recent C-diff colitis.      8. Secondary hyperparathyroidism   Patient on Renvela 1 tab TID WM.            Plan     1.  Arrange for HD today. Plan for 3.5 hours with goal UF of 3500 ml. Will use 400 ml/min blood flow.    2.  Monitor BP after dialysis. Need to resume home BP medications    3.  Continue Epo at dialysis.    4.  Continue Renvela 1 tab TID WM.    5.  Chest pain management per cardiology    6.  Continue  treatment for C-diff. Per IM.       Thank you for asking me to assist in this patient's care. Please  call [phone] at any time with questions.    ??   Will follow.        Patient Active Problem List        Diagnosis  Code         ?  Chest pain  R07.9     ?  CAD (coronary artery disease)  I25.10     ?  ESRD (end stage renal disease) (HCC)  N18.6     ?  HTN (hypertension)  I10     ?  Ascites  R18.8     ?  Diarrhea  R19.7     ?  Nausea & vomiting  R11.2     ?  HTN (hypertension), malignant  I10         ?  Coronary syndrome, acute (HCC)  I24.9             History of Present Illness      I was asked by Dr. Manson Passey to see this patient in consult. David [name]  is a 47 y.o. year old male  [employer] who presents with left side chest pain, which radiate to the left am and jaw since yesterday. He was diagnosed with C-diff colitis since last week and has been on treatment.  He still has multiple episodes of diarrhea.       [name] is with established history of ESRD and is on HD every MWF at [facility] [place]. He has been on dialysis for 7 years per him. He had a LUE AVF, which did not work, he currently has a LIJ TDC. He still has significant left arm edema. He received  HD on Wednesday, but HD had to be cut short due to his diarrhea.       Mr. Darden other medical history include HTN, asthma, CAD, s/p stent x2, Recurrent ascites, requiring paracentesis every 2 weeks.         Review of Systems     Constitutional: Negative for fever  and chills.    Skin: Negative for rash or skin lesions.    Eyes: Sclera clear   Cardiovascular: has chest pain,exertional dyspnea and palpitations.    Respiratory: Negative for cough and hemoptysis. Has shortness of breath. No orthopnea or pnd.    Gastrointestinal: has nausea and vomiting and diarrhea. Has abd distention.     Genitourinary: HD patient. Does not make much urine. Negative for dysuria,blood in the urine,frequency or burning on micturition. No difficulty  with  initiating urination.   Musculoskeletal: Negative  for arthralgias,back pain or neck pain.   Neurological: Negative for dizziness or syncopal episodes    Psychiatric: Negative.         Past Medical/Psychiatric/Surgical History          Past Medical History:        Diagnosis  Date         ?  Ascites       ?  Asthma       ?  Chronic kidney disease       ?  History of cardiac cath            with 2 stents         ?  Hypertension       ?  Infectious disease           ?  Myocardial infarction [facility])          History reviewed. No pertinent surgical  history.   History reviewed. No pertinent surgical history.         Social History          Social History          Socioeconomic History         ?  Marital status:  SINGLE              Spouse name:  Not on file         ?  Number of children:  Not on file     ?  Years of education:  Not on file     ?  Highest education level:  Not on file       Tobacco Use         ?  Smoking status:  Current Every Day Smoker              Packs/day:  0.25         ?  Smokeless tobacco:  Never Used       Substance and Sexual Activity         ?  Alcohol use:  No              Frequency:  Never         ?  Drug use:  No         ?  Sexual activity:  Not Currently          Social History          Substance and Sexual Activity        Sexual Activity  Not Currently          Social History          Tobacco Use        Smoking Status  Current Every Day Smoker         ?  Packs/day:  0.25        Smokeless Tobacco  Never Used          Social History          Substance and Sexual Activity        Drug Use  No           Social History          Substance and Sexual Activity        Alcohol Use  No         ?  Frequency:  Never          Family History     History reviewed. No pertinent family history.        Medications        Current Medications:     Current Facility-Administered Medications             Medication  Dose  Route  Frequency  Provider  Last Rate  Last Dose              ?  nitroglycerin (NITROSTAT) tablet  0.4 mg   0.4 mg  SubLINGual  PRN  Docia FurlBrown, Neil G, MD     0.4 mg at 06/17/17 [id number]     ?  albumin human 25% (BUMINATE) solution 25 g   25 g  IntraVENous  PRN  Fabio AsaHuang, Dayne Dekay E., MD           ?  sodium chloride 0.9 % bolus infusion 500 mL   500 mL  IntraVENous  PRN  Fabio AsaHuang, Erion Hermans E., MD           ?  HYDROmorphone (PF) (DILAUDID) injection 0.5 mg   0.5 mg  IntraVENous  NOW  Docia Furl, MD                    ?  heparin (porcine) 1,000 unit/mL injection 4,400 Units   4,400 Units  Hemodialysis  Q MON, WED & Rosario Jacks., MD                Current Outpatient Medications          Medication  Sig  Dispense  Refill           ?  hydrALAZINE (APRESOLINE) 100 mg tablet  Take 100 mg by mouth three (3) times daily.         ?  amLODIPine (NORVASC) 10 mg tablet  Take 10 mg by mouth daily.         ?  carvedilol (COREG) 12.5 mg tablet  Take 12.5 mg by mouth two (2) times daily (with meals).         ?  furosemide (LASIX) 80 mg tablet  Take 80 mg by mouth daily.         ?  sevelamer (RENAGEL) 400 mg tablet  Take 800 mg by mouth three (3) times daily (with meals).               ?  albuterol (PROVENTIL HFA, VENTOLIN HFA, PROAIR HFA) 90 mcg/actuation inhaler  Take 2 Puffs by inhalation every four (4) hours as needed for Wheezing.               Prior to Admission Medications:      (Not in a hospital admission)        Allergies          Allergies        Allergen  Reactions         ?  Lisinopril  Anaphylaxis     ?  Morphine  Anaphylaxis         ?  Toradol [Ketorolac]  Anaphylaxis             Physical Exam        Visit Vitals      BP  (!) 192/101     Pulse  (!) 107     Temp  97.9 ??F (36.6 ??C)     Resp  20     Ht  6\' 1"  (1.854 m)     Wt  111.1 kg (245 lb)     SpO2  100%        BMI  32.32 kg/m??        GEN:  AAOX3, NAD   HEENT: sclerae anicteric, conjunctivae WNL    NECK: No JVD,Trachea midline. No thyromegally   LYMPHORETICULAR:  No lymphadenopathy.    LUNGS: scattered crackles at bilateral base.    CVS: Heart RRR, No S4 or S3 Gallop, No  Pericardial Rub.   CHEST WALL: TDC noted   ABD: Soft, distended, Non Tender, Bowel Sounds normal.   EXTR: Left arm edema. Trace bilateral leg edema.     SKIN: Skin without rashes, NL turgor   CNS: Speech normal, alert and oriented, easily falls into sleep during interviewing.         Laboratory:        CBC w/Diff     Lab Results         Component  Value  Date/Time  WBC  6.3  06/17/2017 05:25 AM       RBC  2.59 (L)  06/17/2017 05:25 AM       HCT  24.6 (L)  06/17/2017 05:25 AM       MCV  95.0  06/17/2017 05:25 AM       MCH  30.9  06/17/2017 05:25 AM       MCHC  32.5  06/17/2017 05:25 AM            RDW  16.5 (H)  02/07/2017 07:14 PM          Lab Results         Component  Value  Date/Time            MONOS  6.6  06/17/2017 05:25 AM       EOS  10.1 (H)  06/17/2017 05:25 AM       BASOS  0.5  06/17/2017 05:25 AM            RDW  16.5 (H)  02/07/2017 07:14 PM           Recent Labs         06/17/17   0525      GLU  88   86      NA  138   140      K  5.2*   5.2*      CL  103   103      CO2  25   26      BUN  57*   54*      CREA  9.9*   10.1*      CA  6.2*   6.0*      AGAP  11   11      AP  240*      TP  7.0      ALB  2.9*      ALT  19      SGOT  38*                   Thankyou for asking me to assist in this patient's care.   Will follow.      Fabio Asa, MD,    11:20 AM ,06/17/2017   [facility],   [phone] (Office) Call at any time   [phone] (Pager) Call before 4:30 pm

## 2017-06-18 LAB — CBC WITH AUTOMATED DIFF
BASOPHILS: 0.5 % (ref 0–3)
EOSINOPHILS: 12.2 % — ABNORMAL HIGH (ref 0–5)
HCT: 26.8 % — ABNORMAL LOW (ref 37.0–50.0)
HGB: 8.1 gm/dl — ABNORMAL LOW (ref 12.4–17.2)
IMMATURE GRANULOCYTES: 0.4 % (ref 0.0–3.0)
LYMPHOCYTES: 6.7 % — ABNORMAL LOW (ref 28–48)
MCH: 30 pg (ref 23.0–34.6)
MCHC: 30.2 gm/dl (ref 30.0–36.0)
MCV: 99.3 fL — ABNORMAL HIGH (ref 80.0–98.0)
MONOCYTES: 9 % (ref 1–13)
MPV: 10 fL (ref 6.0–10.0)
NEUTROPHILS: 71.2 % — ABNORMAL HIGH (ref 34–64)
NRBC: 0 (ref 0–0)
PLATELET: 119 10*3/uL — ABNORMAL LOW (ref 140–450)
RBC: 2.7 M/uL — ABNORMAL LOW (ref 3.80–5.70)
RDW-SD: 60.4 — ABNORMAL HIGH (ref 35.1–43.9)
WBC: 5.7 10*3/uL (ref 4.0–11.0)

## 2017-06-18 LAB — METABOLIC PANEL, COMPREHENSIVE
ALT (SGPT): 19 U/L (ref 12–78)
AST (SGOT): 38 U/L — ABNORMAL HIGH (ref 15–37)
Albumin: 3 gm/dl — ABNORMAL LOW (ref 3.4–5.0)
Alk. phosphatase: 251 U/L — ABNORMAL HIGH (ref 45–117)
Anion gap: 10 mmol/L (ref 5–15)
BUN: 37 mg/dl — ABNORMAL HIGH (ref 7–25)
Bilirubin, total: 0.5 mg/dl (ref 0.2–1.0)
CO2: 30 mEq/L (ref 21–32)
Calcium: 6.8 mg/dl — ABNORMAL LOW (ref 8.5–10.1)
Chloride: 104 mEq/L (ref 98–107)
Creatinine: 7.6 mg/dl — ABNORMAL HIGH (ref 0.6–1.3)
GFR est AA: 10
GFR est non-AA: 8
Glucose: 81 mg/dl (ref 74–106)
Potassium: 5.2 mEq/L — ABNORMAL HIGH (ref 3.5–5.1)
Protein, total: 6.7 gm/dl (ref 6.4–8.2)
Sodium: 143 mEq/L (ref 136–145)

## 2017-06-18 LAB — MAGNESIUM: Magnesium: 2.1 mg/dl (ref 1.6–2.6)

## 2017-06-18 LAB — TSH 3RD GENERATION: TSH: 2.78 u[IU]/mL (ref 0.358–3.740)

## 2017-06-18 LAB — AMMONIA: Ammonia, plasma: <10 umol/L — ABNORMAL LOW (ref 25–32)

## 2017-06-18 LAB — LIPASE: Lipase: 396 U/L — ABNORMAL HIGH (ref 73–393)

## 2017-06-18 LAB — T4, FREE: Free T4: 1 ng/dl (ref 0.76–1.46)

## 2017-06-18 LAB — PHOSPHORUS: Phosphorus: 4.4 mg/dl (ref 2.5–4.9)

## 2017-06-18 MED FILL — VANCOMYCIN ORAL SOLUTION 100 MG/ML CPD: 100 mg/ml | ORAL | Qty: 1.25

## 2017-06-18 MED FILL — HYDROMORPHONE (PF) 1 MG/ML IJ SOLN: 1 mg/mL | INTRAMUSCULAR | Qty: 1

## 2017-06-18 MED FILL — CULTURELLE 10 BILLION CELL CAPSULE: 10 billion cell | ORAL | Qty: 1

## 2017-06-18 MED FILL — IPRATROPIUM-ALBUTEROL 2.5 MG-0.5 MG/3 ML NEB SOLUTION: 2.5 mg-0.5 mg/3 ml | RESPIRATORY_TRACT | Qty: 3

## 2017-06-18 MED FILL — PROCRIT 20,000 UNIT/ML INJECTION SOLUTION: 20000 unit/mL | INTRAMUSCULAR | Qty: 0.28

## 2017-06-18 MED FILL — BD POSIFLUSH NORMAL SALINE 0.9 % INJECTION SYRINGE: INTRAMUSCULAR | Qty: 10

## 2017-06-18 NOTE — Progress Notes (Signed)
Nutrition Note: Unable to complete Nutrition assessment due to pt leaving AMA

## 2017-06-18 NOTE — Discharge Summary (Signed)
Physician Discharge Summary     Patient ID:  Patient Name: David [name]  Medical Record Number: [id number]  Date of Birth: 12/01/1969  Age: 47 y.o.    Primary Care Provider: Candis Schatz, MD    Code Status: Full Code     Admit date: 06/17/2017    Discharge Date:  06/18/2017    Admitting Physician: Kristen Loader, MD    Discharge Physician: Kristen Loader, MD    Discharge Disposition: Left Against Medical Advice    Admission Diagnoses: Coronary syndrome, acute [facility]) [I24.9]  Coronary syndrome, acute [facility]) [I24.9]    Discharge Diagnoses: see below    Admission Condition: fair    Hospital Course:     Pt was admitted to the hospital evaluated by Renal for HD, Cardiology for c/o CP. Pt had HD 3/1 which improved his fluid status as he had only partial HD on 2/27 as he had N/V during HD which cut that session short. Pt presented to [facility] ER with complaints of CP and abd pain. Of note, the pt recently was at a [facility] in [place] on 05/16/2017 and left AMA because they would not treat his pain. We had plans of large volume paracentesis for 3/2, but apparently he was unhappy that he was not getting another HD session 3/2 and he was not satisfied with the pain medication regimen that was given to him (Dilaudid 0.40m IV q4h and Oxycodone IR 5-168mPO q4h PRN). The pt decided to leave the hospital AMA.    Pt's medical issues prior to leaving AMA:    Abdominal pain/Refractory ascites  Pt with sig abd bloating on admission  Pt was planned to go for therapeutic paracentesis in the AM 3/2, but he left AMA  Pt noted he gets paracentesis q2 weeks  Lab studies for infxn and basic ascitic fluid chem were planned  Pt was on Dilaudid/Oxy for pain  ??  Chest pain/CAD  S/p MI and PCI with stents in 2010  Troponin mildly elevated (0.638)  Cardiology is following.  Echo ordered to eval EF, but pt refused it  Continue Coreg  No plans of invasive ischemic eval unless sig changes on Echo.  Medical mgt  ??  ESRD on HD  S/p HD on 3/1 with 3L removed  ??   HTN  S/p HD  Continue Coreg, Norvasc.  Pt has hydralazine PRN  Cards suggested to up titrate Coreg if still hypertensive 3/2  ??  Asthma  Nebs PRN  ??  Anemia of renal dz  Hb 8 (3/1)  Transfuse PRBC is Hb <7  ??  OSA on CPAP  Pt may use his machine if he brings it in  ??  C diff recurrent  Pt with diarrhea per pt. Resume Vancomycin PO (3/1)  Stool for C diff was not obtained as he left AMA  Contact isolation  ??  Chronic systolic CHF  Fluid mgt per HD  Most recent Echo (02/2017) with LVEF 35% and Grade 2 DD, mild-mod AR, mild mod MR, mod-severe TR, moderate pulm HTN  CxR (3/1) without pulmonary edema  Repeat Echo was planned, but the pt refused to have the echo done when the technician came by in the ER on 3/1  ??  L arm swelling  Our plan was to get a PVL of the L arm to evaluate him further, but he left AMA before it could be done  The L arm swelling may be end up being chronic due to  the prior infxn and removal of his AVF in that arm  ??  Chronic pain syndrome  Pt was on Dilaudid 0.10m IV q4h PRN and oxycodone 5-186mPO q4h PRN while here  His pain should have improved after the paracentesis, but he did not remain in the hospital to have it done  ??  Pulm HTN  Fluid mgt with HD  Continue CPAP use.  ??  Tobacco dependence.  Smoking cessation education  ??  Hx of LUE AVF infection  Noted on chart review  S/p removal of AVF  ??  Drug seeking behavior  Noted upon chart review and reinforced with the pt leaving the hospital AMA for at least the second time this year  ??  DVT Prophylaxis.  Heparin SQ while inpt  ??  Code status. Full code  ??  Disposition  Pt left the hospital AMA    ------------------    65 minutes were spent on the discharge summary    Visit Vitals  BP 155/86 (BP 1 Location: Right arm, BP Patient Position: Supine)   Pulse 100   Temp 98 ??F (36.7 ??C)   Resp 18   Ht _0  (1.854 m)   Wt 111.1 kg (245 lb)   SpO2 100%   BMI 32.32 kg/m??       Initial presentation and work-up:  [As per Dr  MaKathyrn LassH+P from 06/17/2017]   #############  "  History of Presenting Illness:   ??  Pt is a [age]o man with a PMH sig for ESRD on HD, recurrent ascites, HTN, asthma, tobacco use, anemia of renal dz, OSA, chronic systolic CHF who presented with SOB, CP, Abd pain. Pt also noted that he had to cut short the HD on Wed since he had episodes of N/V. He noted he has been having issues with poor PO and unable to keep anything down since last Friday (8 days prior). He noted he took a SL NTG at home when he had CP but it did not help. Pt noted a hx of recurrent ascites for which he gets large volume paracentesis about every 2 weeks. He noted it's been a bit over 2 weeks since his last one. [facility]. He noted that he will be moving to the area soon. He noted that he feels a little better after the hemodialysis. Pt noted he still has some CP and dilaudid works well. He said that the Dilaudid given earlier (0.40m27mseemed to work.  Per Epic review of the chart, the patient was [facility] [place]16[place] and left AMA because they would not treat his pain. At that time he also reported he was diagnosed with C diff one month prior to that stay and had similar chest pressue with vomiting. [place] [facility] [place] [facility] Pt had no other complaints based on a 12-step ROS  ??  ??  ??  ??       Past Medical History:   Diagnosis Date   ??? Ascites ??   ??? Asthma ??   ??? Chronic kidney disease ??   ??? History of cardiac cath ??   ?? with 2 stents   ??? Hypertension ??   ??? Infectious disease ??   ??? [facility]      History reviewed. No pertinent surgical history.  Prior to Admission medications    Medication Sig Start Date End Date Taking? Authorizing Provider   hydrALAZINE (APRESOLINE) 100 mg tablet Take 100 mg by mouth three (3) times daily. ?? ?? Yes Provider, Historical    amLODIPine (NORVASC) 10 mg tablet Take 10 mg by mouth daily. ?? ?? Yes Provider, Historical   carvedilol (COREG) 12.5 mg tablet Take 12.5 mg by mouth two (2) times daily (with meals). ?? ?? Yes Provider, Historical   sevelamer (RENAGEL) 400 mg tablet Take 800 mg by mouth three (3) times daily (with meals). ?? ?? Yes Provider, Historical   albuterol (PROVENTIL HFA, VENTOLIN HFA, PROAIR HFA) 90 mcg/actuation inhaler Take 2 Puffs by inhalation every four (4) hours as needed for Wheezing. ?? ?? Yes Provider, Historical   ??       Allergies   Allergen Reactions   ??? Lisinopril Anaphylaxis   ??? Morphine Anaphylaxis   ??? Toradol [Ketorolac] Anaphylaxis      History reviewed. No pertinent family history.   Social History   ??        Socioeconomic History   ??? Marital status: SINGLE   ?? ?? Spouse name: Not on file   ??? Number of children: Not on file   ??? Years of education: Not on file   ??? Highest education level: Not on file   Social Needs   ??? Financial resource strain: Not on file   ??? Food insecurity - worry: Not on file   ??? Food insecurity - inability: Not on file   ??? Transportation needs - medical: Not on file   ??? Transportation needs - non-medical: Not on file   Occupational History   ??? Not on file   Tobacco Use   ??? Smoking status: Current Every Day Smoker   ?? ?? Packs/day: 0.25   ??? Smokeless tobacco: Never Used   Substance and Sexual Activity   ??? Alcohol use: No   ?? ?? Frequency: Never   ??? Drug use: No   ??? Sexual activity: Not Currently   Other Topics Concern   ??? Not on file   Social History Narrative   ??? Not on file   ??  ??  Objective:   ??  Physical Exam:   ??  Visit Vitals  BP (!) 156/94   Pulse 98   Temp 98.1 ??F (36.7 ??C)   Resp 17   Ht '6\' 1"'$  (1.854 m)   Wt 111.1 kg (245 lb)   SpO2 100%   BMI 32.32 kg/m??    O2 Flow Rate (L/min): 3 l/min O2 Device: Nasal cannula    Physical exam:  General: A+Ox3, NAD, Cooperative  HEENT: Sclera anicteric, PERRL, OM moist, throat clear.  No thrush.   Neck:  No LAD, no bruits, carotid pulses are equal bilaterally.  Back: no spinal or flank tenderness to percussion or palpation  Chest: CTA B, no wheeze, no rales, no rhonchi.  Fair air movement.  CV: RRR, S1/S2 were normal, no s3/s4, no murmur  Abdomen: Mild diffuse abd tenderness, Bloated, + fluid wave, NABS, no masses were noted.  No rebound, no guarding.  No tympany.  Extremities:  1+ edema in the legs.  2+ edema in the L arm.  No pitting or cyanosis.  Radial, dorsalis pedis and anterior tibial pulses were 2+ bilaterally.  Neuro: No focal neurological deficits were noted, cranial nerves 2-12 were intact."  #############    ACUTE DIAGNOSES:  Coronary syndrome, acute (H[facility]) [I24.9]  Coronary syndrome,  acute ([place]) [I24.9]    CHRONIC MEDICAL DIAGNOSES:  Problem List as of 06/18/2017 Never Reviewed          Codes Class Noted - Resolved    Coronary syndrome, acute ([place]) ICD-10-CM: I24.9  ICD-9-CM: 411.1  06/17/2017 - Present        Chest pain ICD-10-CM: R07.9  ICD-9-CM: 786.50  02/07/2017 - Present        CAD (coronary artery disease) ICD-10-CM: I25.10  ICD-9-CM: 414.00  02/07/2017 - Present        ESRD (end stage renal disease) ([place]) ICD-10-CM: N18.6  ICD-9-CM: 585.6  02/07/2017 - Present        HTN (hypertension) ICD-10-CM: I10  ICD-9-CM: 401.9  02/07/2017 - Present        Ascites ICD-10-CM: R18.8  ICD-9-CM: 789.59  02/07/2017 - Present        Diarrhea ICD-10-CM: R19.7  ICD-9-CM: 787.91  02/07/2017 - Present        Nausea & vomiting ICD-10-CM: R11.2  ICD-9-CM: 787.01  02/07/2017 - Present        HTN (hypertension), malignant ICD-10-CM: I10  ICD-9-CM: 401.0  02/07/2017 - Present                Data Review:       Recent Days:  Recent Labs     06/18/17  0455 06/17/17  0525   WBC 5.7 6.3   HGB 8.1* 8.0*   HCT 26.8* 24.6*   PLT 119* 105*     Recent Labs     06/18/17  0455 06/17/17  0525   NA 143 138   140   K 5.2* 5.2*   5.2*   CL 104 103   103   CO2 _0 GLU 81 88   86   BUN 37* 57*   54*   CREA 7.6* 9.9*   10.1*    CA 6.8* 6.2*   6.0*   MG 2.1  --    PHOS 4.4  --    ALB 3.0* 2.9*   TBILI 0.5 0.4   SGOT 38* 38*   ALT 19 19   INR  --  1.1     No results for input(s): PH, PCO2, PO2, HCO3, FIO2 in the last 72 hours.    24 Hour Results:  Recent Results (from the past 24 hour(s))   HEP B SURFACE AG    Collection Time: 06/17/17  9:30 AM   Result Value Ref Range    Hepatitis B surface Ag NON-REACTIVE NON-REACTIVE     HEP B SURFACE AB    Collection Time: 06/17/17  9:30 AM   Result Value Ref Range    Hepatitis B surface Ab REACTIVE     TROPONIN I    Collection Time: 06/17/17  2:30 PM   Result Value Ref Range    Troponin-I 0.487 (H) 0.000 - 0.045 ng/ml   EKG, 12 LEAD, INITIAL    Collection Time: 06/17/17  2:33 PM   Result Value Ref Range    Ventricular Rate 104 BPM    Atrial Rate 104 BPM    P-R Interval 194 ms    QRS Duration 88 ms    Q-T Interval 358 ms    QTC Calculation (Bezet) 470 ms    Calculated P Axis 69 degrees    Calculated R Axis 1 degrees    Calculated T Axis 90 degrees    Diagnosis       Sinus  tachycardia  Possible Left atrial enlargement  Nonspecific T wave abnormality  Abnormal ECG  When compared with ECG of 17-Jun-2017 04:59, (Unconfirmed)  No significant change was found  Confirmed by Oleta Mouse, M.D., Krista Blue ([id number]) on 06/17/2017 4:40:22 PM     PHOSPHORUS    Collection Time: 06/18/17  4:55 AM   Result Value Ref Range    Phosphorus 4.4 2.5 - 4.9 mg/dl   METABOLIC PANEL, COMPREHENSIVE    Collection Time: 06/18/17  4:55 AM   Result Value Ref Range    Sodium 143 136 - 145 mEq/L    Potassium 5.2 (H) 3.5 - 5.1 mEq/L    Chloride 104 98 - 107 mEq/L    CO2 30 21 - 32 mEq/L    Glucose 81 74 - 106 mg/dl    BUN 37 (H) 7 - 25 mg/dl    Creatinine 7.6 (H) 0.6 - 1.3 mg/dl    GFR est AA 10.0      GFR est non-AA 8      Calcium 6.8 (L) 8.5 - 10.1 mg/dl    AST (SGOT) 38 (H) 15 - 37 U/L    ALT (SGPT) 19 12 - 78 U/L    Alk. phosphatase 251 (H) 45 - 117 U/L    Bilirubin, total 0.5 0.2 - 1.0 mg/dl    Protein, total 6.7 6.4 - 8.2 gm/dl     Albumin 3.0 (L) 3.4 - 5.0 gm/dl    Anion gap 10 5 - 15 mmol/L   MAGNESIUM    Collection Time: 06/18/17  4:55 AM   Result Value Ref Range    Magnesium 2.1 1.6 - 2.6 mg/dl   CBC WITH AUTOMATED DIFF    Collection Time: 06/18/17  4:55 AM   Result Value Ref Range    WBC 5.7 4.0 - 11.0 1000/mm3    RBC 2.70 (L) 3.80 - 5.70 M/uL    HGB 8.1 (L) 12.4 - 17.2 gm/dl    HCT 26.8 (L) 37.0 - 50.0 %    MCV 99.3 (H) 80.0 - 98.0 fL    MCH 30.0 23.0 - 34.6 pg    MCHC 30.2 30.0 - 36.0 gm/dl    PLATELET 119 (L) 140 - 450 1000/mm3    MPV 10.0 6.0 - 10.0 fL    RDW-SD 60.4 (H) 35.1 - 43.9      NRBC 0 0 - 0      IMMATURE GRANULOCYTES 0.4 0.0 - 3.0 %    NEUTROPHILS 71.2 (H) 34 - 64 %    LYMPHOCYTES 6.7 (L) 28 - 48 %    MONOCYTES 9.0 1 - 13 %    EOSINOPHILS 12.2 (H) 0 - 5 %    BASOPHILS 0.5 0 - 3 %   AMMONIA    Collection Time: 06/18/17  4:55 AM   Result Value Ref Range    Ammonia <10 (L) 25 - 32 umol/L   T4, FREE    Collection Time: 06/18/17  4:55 AM   Result Value Ref Range    Free T4 1.00 0.76 - 1.46 ng/dl   TSH 3RD GENERATION    Collection Time: 06/18/17  4:55 AM   Result Value Ref Range    TSH 2.780 0.358 - 3.740 uIU/mL       Xr Chest Sngl V    Result Date: 06/17/2017  INDICATION: chest pain, sob   EXAMINATION: XR CHEST SNGL V COMPARISON: None FINDINGS: Mild cardiomegaly.. The lungs are clear . Right subclavian stent and dialysis catheter.  IMPRESSION: 1. Mild cardiomegaly. 2. No acute pulmonary process.       Microbiology:  All Micro Results     Procedure Component Value Units Date/Time    CULTURE, STOOL [[id number]]     Order Status:  No result Specimen:  Stool     C. DIFFICILE/EPI PCR [[id number]]     Order Status:  No result Specimen:  Stool     CULTURE, ANAEROBIC [[id number]]     Order Status:  No result Specimen:  Ascitic Fluid     CULTURE, BODY FLUID Verdie Drown [[id number]]     Order Status:  No result Specimen:  Ascitic Fluid            Cardiology:  Results for orders placed or performed during the hospital encounter of 06/17/17    EKG, 12 LEAD, INITIAL   Result Value Ref Range    Ventricular Rate 104 BPM    Atrial Rate 104 BPM    P-R Interval 194 ms    QRS Duration 88 ms    Q-T Interval 358 ms    QTC Calculation (Bezet) 470 ms    Calculated P Axis 69 degrees    Calculated R Axis 1 degrees    Calculated T Axis 90 degrees    Diagnosis       Sinus tachycardia  Possible Left atrial enlargement  Nonspecific T wave abnormality  Abnormal ECG  When compared with ECG of 17-Jun-2017 04:59, (Unconfirmed)  No significant change was found  Confirmed by Oleta Mouse, M.D., Krista Blue ([id number]) on 06/17/2017 4:40:22 PM           Problem List:  Problem List as of 06/18/2017 Never Reviewed          Codes Class Noted - Resolved    Coronary syndrome, acute ([place]) ICD-10-CM: I24.9  ICD-9-CM: 411.1  06/17/2017 - Present        Chest pain ICD-10-CM: R07.9  ICD-9-CM: 786.50  02/07/2017 - Present        CAD (coronary artery disease) ICD-10-CM: I25.10  ICD-9-CM: 414.00  02/07/2017 - Present        ESRD (end stage renal disease) ([place]) ICD-10-CM: N18.6  ICD-9-CM: 585.6  02/07/2017 - Present        HTN (hypertension) ICD-10-CM: I10  ICD-9-CM: 401.9  02/07/2017 - Present        Ascites ICD-10-CM: R18.8  ICD-9-CM: 789.59  02/07/2017 - Present        Diarrhea ICD-10-CM: R19.7  ICD-9-CM: 787.91  02/07/2017 - Present        Nausea & vomiting ICD-10-CM: R11.2  ICD-9-CM: 787.01  02/07/2017 - Present        HTN (hypertension), malignant ICD-10-CM: I10  ICD-9-CM: 401.0  02/07/2017 - Present              Signed:  Kristen Loader, MD  06/18/2017

## 2017-06-18 NOTE — Progress Notes (Signed)
Patient refused his lab draw for PTT this am. He is also refusing to go for paracentesis this am. Will endorse to the next shift.

## 2017-06-18 NOTE — Other (Signed)
Pt demanded to lave AMA this morning during morning report.  Pt already had tele off, and was dressed in his own clothes.  Pt asked if he was willing to wait to speak with a MD first, but he refused.  Per per report, pt also refusing medications and labs overnight, except for pain medication.  Pt unhappy regarding his pain medication, wanting his IV dilaudid increased, and had been refusing all PO.  Pt also states that he should have been going for another HD treatment today, but nothing is ordered since pt has followed his normal M, W, F schedule.  Dr. Maryelizabeth KaufmannMunoz made aware.  PIV removed, and pt stated his car was parked in the ER lot.  Pt walked down himself.  AMA form in the chart.

## 2017-06-18 NOTE — Progress Notes (Signed)
Problem: Falls - Risk of  Goal: *Absence of Falls  Document Schmid Fall Risk and appropriate interventions in the flowsheet.  Outcome: Progressing Towards Goal  Fall Risk Interventions:  Mobility Interventions: Patient to call before getting OOB         Medication Interventions: Patient to call before getting OOB, Teach patient to arise slowly    Elimination Interventions: Call light in reach, Patient to call for help with toileting needs    History of Falls Interventions: Bed/chair exit alarm        Problem: Pressure Injury - Risk of  Goal: *Prevention of pressure injury  Document Braden Scale and appropriate interventions in the flowsheet.  Outcome: Progressing Towards Goal  Pressure Injury Interventions:  Sensory Interventions: Assess changes in LOC    Moisture Interventions: Maintain skin hydration (lotion/cream)    Activity Interventions: Pressure redistribution bed/mattress(bed type)    Mobility Interventions: Pressure redistribution bed/mattress (bed type)    Nutrition Interventions: Document food/fluid/supplement intake    Friction and Shear Interventions: Lift sheet

## 2017-06-18 NOTE — Progress Notes (Signed)
Renal Progress Note -- [facility]    David [name]    Date of Birth:  12/01/1969       Assessment / Plan:  1. Admitted for left side chest pain.   History of CAD, s/p stent placement.  Per Cardiology.   ??  2. ESRD on HD every MWF at [facility].   Received HD on Wednesday,but treatment was cut short due to diarrhea.   HD completed yesterday, removed 3500ml of fluid, pt remains alert and oriented, tolerated hd well    Access:  left IJ TDC  ??  3. Anemia  Due to ESRD  Continue Epo at HD.   ??  4. HTN/Vol:  Vol overload.   BP is higher than the goal.   Patient reported of not taking his BP medications regularly this week due to N/V.   ??  5. Electrolytes-Acid/base:   stable  ??  6. Chronic ascites  Patient said he needs paracentesis every 2 weeks.   ??  7. Recent C-diff colitis.  ??  8. Secondary hyperparathyroidism  Patient on Renvela 1 tab TID WM.   ??  ??  Plan   1. Next  HD on Monday  2. Monitor BP after dialysis. Need to resume home BP medications   3. Continue Epo at dialysis.   4. Continue Renvela 1 tab TID WM.   5. Chest pain management per cardiology   6. Continue treatment for C-diff. Per IM.   ??  Call for any questions anytime at # [phone]         Patient Active Problem List   Diagnosis Code   ??? Chest pain R07.9   ??? CAD (coronary artery disease) I25.10   ??? ESRD (end stage renal disease) (HCC) N18.6   ??? HTN (hypertension) I10   ??? Ascites R18.8   ??? Diarrhea R19.7   ??? Nausea & vomiting R11.2   ??? HTN (hypertension), malignant I10   ??? Coronary syndrome, acute (HCC) I24.9                                                                                                               SubjectiveNo SOB/CP/N/V      Objective  Patient Vitals for the past 24 hrs:   BP Temp Pulse Resp SpO2   06/17/17 2328 155/86 98 ??F (36.7 ??C) 100 18 100 %   06/17/17 2143 ??? ??? ??? ??? 100 %   06/17/17 2112 (!) 154/95 98.1 ??F (36.7 ??C) (!) 102 18 100 %   06/17/17 1849 ??? ??? (!) 102 20 ???   06/17/17 1811 (!) 156/94 ??? 98 17 100 %    06/17/17 1810 (!) 156/94 ??? ??? ??? ???   06/17/17 1703 (!) 164/93 ??? 100 16 100 %   06/17/17 1603 185/77 98.1 ??F (36.7 ??C) (!) 102 17 ???   06/17/17 1502 (!) 189/118 ??? (!) 108 23 ???   06/17/17 1401 (!) 170/98 ??? (!) 105 18 100 %   06/17/17 1336 (!) 183/105 98 ??F (36.7 ??  C) (!) 107 17 100 %   06/17/17 1237 (!) 176/98 ??? (!) 107 20 ???   06/17/17 1230 (!) 184/111 97.8 ??F (36.6 ??C) (!) 105 20 ???   06/17/17 1215 (!) 172/100 ??? (!) 110 20 ???   06/17/17 1200 (!) 188/120 ??? (!) 113 20 ???   06/17/17 1145 (!) 191/116 ??? (!) 116 20 ???   06/17/17 1130 (!) 190/100 ??? (!) 111 20 ???   06/17/17 1100 (!) 192/101 ??? (!) 107 20 ???   06/17/17 1045 (!) 190/100 ??? (!) 107 20 ???   06/17/17 1030 (!) 164/106 ??? (!) 107 20 ???   06/17/17 1015 (!) 197/121 ??? (!) 111 20 ???   06/17/17 1006 (!) 175/98 ??? (!) 102 20 ???   06/17/17 0945 (!) 169/94 ??? (!) 102 20 ???   06/17/17 0930 (!) 156/92 ??? (!) 104 20 ???   06/17/17 0915 (!) 187/100 ??? (!) 115 20 ???   06/17/17 0900 128/66 ??? (!) 104 20 ???   06/17/17 0857 152/75 97.9 ??F (36.6 ??C) (!) 101 20 ???   06/17/17 0831 (!) 164/101 ??? (!) 104 21 ???   06/17/17 0817 155/78 ??? (!) 102 22 ???   06/17/17 0803 (!) 177/134 ??? (!) 105 23 ???   06/17/17 0754 (!) 152/93 ??? (!) 106 15 ???   06/17/17 0725 (!) 211/130 ??? (!) 108 22 ???         Intake/Output Summary (Last 24 hours) at 06/18/2017 0706  Last data filed at 06/17/2017 1230  Gross per 24 hour   Intake ???   Output 3400 ml   Net -3400 ml     Physical Assessment:     GEN:, NAD  HEENT: face is symmetric No periorbital edema  LUNGS:Clear to Auscultation, No Rales   TDC is intact  CVS EXM: Heart RRR, No  Gallop, No Pericardial Rub.  Abdomen: Soft, Non Tender, No CVA tenderness. Bowel Sounds normal.  Lower Extremities: No Pitting Edema. No deformities  SKIN:No Rashes,Normal Turgor  Neurological exam reveals:Awake alert and conversant.  MAE equally     Lab    Labs: Results:       Chemistry Recent Labs     06/18/17  0455 06/17/17  0525   GLU 81 88   86   NA 143 138   140   K 5.2* 5.2*   5.2*   CL 104 103   103   CO2 30 25   26     BUN 37* 57*   54*   CREA 7.6* 9.9*   10.1*   CA 6.8* 6.2*   6.0*   AGAP 10 11   11    AP 251* 240*   TP 6.7 7.0   ALB 3.0* 2.9*      CBC w/Diff Recent Labs     06/18/17  0455 06/17/17  0525   WBC 5.7 6.3   RBC 2.70* 2.59*   HGB 8.1* 8.0*   HCT 26.8* 24.6*   PLT 119* 105*   GRANS 71.2* 76.1*   LYMPH 6.7* 6.5*   EOS 12.2* 10.1*      Coagulation Recent Labs     06/17/17  0525   PTP 13.0*   INR 1.1       Iron/Ferritin No results for input(s): IRON in the last 72 hours.    No lab exists for component: TIBCCALC   BNP No results for input(s): BNPP in the last 72 hours.   Cardiac Enzymes No  results for input(s): CPK, CKND1, MYO in the last 72 hours.    No lab exists for component: CKRMB, TROIP   Liver Enzymes Recent Labs     06/18/17  0455   TP 6.7   ALB 3.0*   AP 251*   SGOT 38*      Thyroid Studies Lab Results   Component Value Date/Time    TSH 2.780 06/18/2017 04:55 AM          Current Facility-Administered Medications   Medication Dose Route Frequency Provider Last Rate Last Dose   ??? nitroglycerin (NITROSTAT) tablet 0.4 mg  0.4 mg SubLINGual PRN Docia Furl, MD   0.4 mg at 06/17/17 0657   ??? albumin human 25% (BUMINATE) solution 25 g  25 g IntraVENous PRN Fabio Asa., MD       ??? sodium chloride 0.9 % bolus infusion 500 mL  500 mL IntraVENous PRN Fabio Asa., MD       ??? heparin (porcine) 1,000 unit/mL injection 4,400 Units  4,400 Units Hemodialysis Q MON, WED & Rosario Jacks., MD   4,400 Units at 06/17/17 1150   ??? hydrALAZINE (APRESOLINE) 20 mg/mL injection 20 mg  20 mg IntraVENous Q6H PRN Devoria Albe, NP       ??? epoetin alfa (EPOGEN;PROCRIT) injection 5,600 Units  50 Units/kg IntraVENous DIALYSIS MON, WED & Rosario Jacks., MD       ??? sevelamer carbonate (RENVELA) tab 800 mg  800 mg Oral TID WITH MEALS Fabio Asa., MD   800 mg at 06/17/17 1810   ??? amLODIPine (NORVASC) tablet 10 mg  10 mg Oral DAILY Haydee Salter, MD   10 mg at 06/17/17 1542    ??? carvedilol (COREG) tablet 12.5 mg  12.5 mg Oral BID WITH MEALS Haydee Salter, MD   12.5 mg at 06/17/17 1542   ??? oxyCODONE IR (ROXICODONE) tablet 5-10 mg  5-10 mg Oral Q4H PRN Alvy Bimler, MD       ??? ondansetron [facility]) injection 4 mg  4 mg IntraVENous Q4H PRN Alvy Bimler, MD   4 mg at 06/17/17 1848   ??? promethazine (PHENERGAN) tablet 25 mg  25 mg Oral Q6H PRN Alvy Bimler, MD       ??? diphenhydrAMINE (BENADRYL) capsule 25 mg  25 mg Oral Q6H PRN Alvy Bimler, MD       ??? acetaminophen (TYLENOL) tablet 325 mg  325 mg Oral Q6H PRN Alvy Bimler, MD       ??? albuterol-ipratropium (DUO-NEB) 2.5 MG-0.5 MG/3 ML  3 mL Nebulization Q4H PRN Alvy Bimler, MD   3 mL at 06/17/17 2133   ??? HYDROmorphone (PF) (DILAUDID) injection 0.5 mg  0.5 mg IntraVENous Q4H PRN Alvy Bimler, MD   0.5 mg at 06/18/17 0503   ??? lactobacillus rhamnosus gg 10 billion cell (CULTURELLE) 1 Cap  1 Cap Oral ACB&D Alvy Bimler, MD       ??? vancomycin 100 mg/ml oral solution (compounded) 125 mg  125 mg Oral Q6H Alvy Bimler, MD   125 mg at 06/18/17 0251   ??? sodium chloride (NS) flush 5-10 mL  5-10 mL IntraVENous Q8H Alvy Bimler, MD   10 mL at 06/18/17 0600   ??? sodium chloride (NS) flush 5-10 mL  5-10 mL IntraVENous PRN Alvy Bimler, MD       ??? naloxone (NARCAN) injection 0.1 mg  0.1 mg IntraVENous PRN  Alvy Bimler, MD       ??? melatonin tablet 3 mg  3 mg Oral QHS PRN Alvy Bimler, MD       ??? heparin (porcine) injection 5,000 Units  5,000 Units SubCUTAneous Q12H Alvy Bimler, MD           06/18/2017  7:06 AM  Thank you,    Geryl Councilman MD, [facility]  [phone]

## 2017-06-18 NOTE — Discharge Summary (Signed)
Physician Discharge Summary     Patient ID:  Patient Name: David [name]  Medical Record Number: [id number]  Date of Birth: 12/01/1969  Age: 47 y.o.    Primary Care Provider: Candis Schatz, MD    Code Status: Full Code     Admit date: 06/17/2017    Discharge Date:  06/18/2017    Admitting Physician: Kristen Loader, MD    Discharge Physician: Kristen Loader, MD    Discharge Disposition: Left Against Medical Advice    Admission Diagnoses: Coronary syndrome, acute [facility]) [I24.9]  Coronary syndrome, acute [facility]) [I24.9]    Discharge Diagnoses: see below    Admission Condition: fair    Hospital Course:     Pt was admitted to the hospital evaluated by Renal for HD, Cardiology for c/o CP. Pt had HD 3/1 which improved his fluid status as he had only partial HD on 2/27 as he had N/V during HD which cut that session short. Pt presented to [facility] ER with complaints of CP and abd pain. Of note, the pt recently was at a [facility] in [place] on 05/16/2017 and left AMA because they would not treat his pain. We had plans of large volume paracentesis for 3/2, but apparently he was unhappy that he was not getting another HD session 3/2 and he was not satisfied with the pain medication regimen that was given to him (Dilaudid 0.64m IV q4h and Oxycodone IR 5-173mPO q4h PRN). The pt decided to leave the hospital AMA.    Pt's medical issues prior to leaving AMA:    Abdominal pain/Refractory ascites  Pt with sig abd bloating on admission  Pt was planned to go for therapeutic paracentesis in the AM 3/2, but he left AMA  Pt noted he gets paracentesis q2 weeks  Lab studies for infxn and basic ascitic fluid chem were planned  Pt was on Dilaudid/Oxy for pain  ??  Chest pain/CAD  S/p MI and PCI with stents in 2010  Troponin mildly elevated (0.638)  Cardiology is following.  Echo ordered to eval EF, but pt refused it  Continue Coreg  No plans of invasive ischemic eval unless sig changes on Echo.  Medical mgt  ??  ESRD on HD  S/p HD on 3/1 with 3L  removed  ??  HTN  S/p HD  Continue Coreg, Norvasc.  Pt has hydralazine PRN  Cards suggested to up titrate Coreg if still hypertensive 3/2  ??  Asthma  Nebs PRN  ??  Anemia of renal dz  Hb 8 (3/1)  Transfuse PRBC is Hb <7  ??  OSA on CPAP  Pt may use his machine if he brings it in  ??  C diff recurrent  Pt with diarrhea per pt. Resume Vancomycin PO (3/1)  Stool for C diff was not obtained as he left AMA  Contact isolation  ??  Chronic systolic CHF  Fluid mgt per HD  Most recent Echo (02/2017) with LVEF 35% and Grade 2 DD, mild-mod AR, mild mod MR, mod-severe TR, moderate pulm HTN  CxR (3/1) without pulmonary edema  Repeat Echo was planned, but the pt refused to have the echo done when the technician came by in the ER on 3/1  ??  L arm swelling  Our plan was to get a PVL of the L arm to evaluate him further, but he left AMA before it could be done  The L arm swelling may be end up being chronic due to  the prior infxn and removal of his AVF in that arm  ??  Chronic pain syndrome  Pt was on Dilaudid 0.2m IV q4h PRN and oxycodone 5-163mPO q4h PRN while here  His pain should have improved after the paracentesis, but he did not remain in the hospital to have it done  ??  Pulm HTN  Fluid mgt with HD  Continue CPAP use.  ??  Tobacco dependence.  Smoking cessation education  ??  Hx of LUE AVF infection  Noted on chart review  S/p removal of AVF  ??  Drug seeking behavior  Noted upon chart review and reinforced with the pt leaving the hospital AMA for at least the second time this year  ??  DVT Prophylaxis.  Heparin SQ while inpt  ??  Code status. Full code  ??  Disposition  Pt left the hospital AMA    ------------------    65 minutes were spent on the discharge summary    Visit Vitals  BP 155/86 (BP 1 Location: Right arm, BP Patient Position: Supine)   Pulse 100   Temp 98 ??F (36.7 ??C)   Resp 18   Ht '6\' 1"'  (1.854 m)   Wt 111.1 kg (245 lb)   SpO2 100%   BMI 32.32 kg/m??       Initial presentation and work-up:  [As per Dr  MaKathyrn LassH+P  from 06/17/2017]  #############  "  History of Presenting Illness:   ??  Pt is a [age]o man with a PMH sig for ESRD on HD, recurrent ascites, HTN, asthma, tobacco use, anemia of renal dz, OSA, chronic systolic CHF who presented with SOB, CP, Abd pain. Pt also noted that he had to cut short the HD on Wed since he had episodes of N/V. He noted he has been having issues with poor PO and unable to keep anything down since last Friday (8 days prior). He noted he took a SL NTG at home when he had CP but it did not help. Pt noted a hx of recurrent ascites for which he gets large volume paracentesis about every 2 weeks. He noted it's been a bit over 2 weeks since his last one. [facility]. He noted that he will be moving to the area soon. He noted that he feels a little better after the hemodialysis. Pt noted he still has some CP and dilaudid works well. He said that the Dilaudid given earlier (0.36m4mseemed to work.  Per Epic review of the chart, the patient was [facility] [place] [facility] and left AMA because they would not treat his pain. At that time he also reported he was diagnosed with C diff one month prior to that stay and had similar chest pressue with vomiting. [place] [facility] [place] [facility] Pt had no other complaints based on a 12-step ROS  ??  ??  ??  ??       Past Medical History:   Diagnosis Date   ??? Ascites ??   ??? Asthma ??   ??? Chronic kidney disease ??   ??? History of cardiac cath ??   ?? with 2 stents   ??? Hypertension ??   ??? Infectious disease ??   ??? [facility]      History reviewed. No pertinent surgical history.  Prior to Admission medications    Medication Sig Start Date End Date Taking? Authorizing Provider   hydrALAZINE (APRESOLINE) 100 mg tablet Take 100 mg by mouth three (3) times daily. ?? ?? Yes Provider, Historical   amLODIPine  (NORVASC) 10 mg tablet Take 10 mg by mouth daily. ?? ?? Yes Provider, Historical   carvedilol (COREG) 12.5 mg tablet Take 12.5 mg by mouth two (2) times daily (with meals). ?? ?? Yes Provider, Historical   sevelamer (RENAGEL) 400 mg tablet Take 800 mg by mouth three (3) times daily (with meals). ?? ?? Yes Provider, Historical   albuterol (PROVENTIL HFA, VENTOLIN HFA, PROAIR HFA) 90 mcg/actuation inhaler Take 2 Puffs by inhalation every four (4) hours as needed for Wheezing. ?? ?? Yes Provider, Historical   ??       Allergies   Allergen Reactions   ??? Lisinopril Anaphylaxis   ??? Morphine Anaphylaxis   ??? Toradol [Ketorolac] Anaphylaxis      History reviewed. No pertinent family history.   Social History   ??        Socioeconomic History   ??? Marital status: SINGLE   ?? ?? Spouse name: Not on file   ??? Number of children: Not on file   ??? Years of education: Not on file   ??? Highest education level: Not on file   Social Needs   ??? Financial resource strain: Not on file   ??? Food insecurity - worry: Not on file   ??? Food insecurity - inability: Not on file   ??? Transportation needs - medical: Not on file   ??? Transportation needs - non-medical: Not on file   Occupational History   ??? Not on file   Tobacco Use   ??? Smoking status: Current Every Day Smoker   ?? ?? Packs/day: 0.25   ??? Smokeless tobacco: Never Used   Substance and Sexual Activity   ??? Alcohol use: No   ?? ?? Frequency: Never   ??? Drug use: No   ??? Sexual activity: Not Currently   Other Topics Concern   ??? Not on file   Social History Narrative   ??? Not on file   ??  ??  Objective:   ??  Physical Exam:   ??  Visit Vitals  BP (!) 156/94   Pulse 98   Temp 98.1 ??F (36.7 ??C)   Resp 17   Ht '6\' 1"'  (1.854 m)   Wt 111.1 kg (245 lb)   SpO2 100%   BMI 32.32 kg/m??    O2 Flow Rate (L/min): 3 l/min O2 Device: Nasal cannula    Physical exam:  General: A+Ox3, NAD, Cooperative  HEENT: Sclera anicteric, PERRL, OM moist, throat clear.  No thrush.  Neck:  No LAD, no bruits, carotid pulses are equal  bilaterally.  Back: no spinal or flank tenderness to percussion or palpation  Chest: CTA B, no wheeze, no rales, no rhonchi.  Fair air movement.  CV: RRR, S1/S2 were normal, no s3/s4, no murmur  Abdomen: Mild diffuse abd tenderness, Bloated, + fluid wave, NABS, no masses were noted.  No rebound, no guarding.  No tympany.  Extremities:  1+ edema in the legs.  2+ edema in the L arm.  No pitting or cyanosis.  Radial, dorsalis pedis and anterior tibial pulses were 2+ bilaterally.  Neuro: No focal neurological deficits were noted, cranial nerves 2-12 were intact."  #############    ACUTE DIAGNOSES:  Coronary syndrome, acute (HCC) [I24.9]  Coronary syndrome,  acute ([place]) [I24.9]    CHRONIC MEDICAL DIAGNOSES:  Problem List as of 06/18/2017 Never Reviewed          Codes Class Noted - Resolved    Coronary syndrome, acute ([place]) ICD-10-CM: I24.9  ICD-9-CM: 411.1  06/17/2017 - Present        Chest pain ICD-10-CM: R07.9  ICD-9-CM: 786.50  02/07/2017 - Present        CAD (coronary artery disease) ICD-10-CM: I25.10  ICD-9-CM: 414.00  02/07/2017 - Present        ESRD (end stage renal disease) ([place]) ICD-10-CM: N18.6  ICD-9-CM: 585.6  02/07/2017 - Present        HTN (hypertension) ICD-10-CM: I10  ICD-9-CM: 401.9  02/07/2017 - Present        Ascites ICD-10-CM: R18.8  ICD-9-CM: 789.59  02/07/2017 - Present        Diarrhea ICD-10-CM: R19.7  ICD-9-CM: 787.91  02/07/2017 - Present        Nausea & vomiting ICD-10-CM: R11.2  ICD-9-CM: 787.01  02/07/2017 - Present        HTN (hypertension), malignant ICD-10-CM: I10  ICD-9-CM: 401.0  02/07/2017 - Present                Data Review:       Recent Days:  Recent Labs     06/18/17  0455 06/17/17  0525   WBC 5.7 6.3   HGB 8.1* 8.0*   HCT 26.8* 24.6*   PLT 119* 105*     Recent Labs     06/18/17  0455 06/17/17  0525   NA 143 138   140   K 5.2* 5.2*   5.2*   CL 104 103   103   CO2 '30 25   26   ' GLU 81 88   86   BUN 37* 57*   54*   CREA 7.6* 9.9*   10.1*   CA 6.8* 6.2*   6.0*   MG 2.1  --    PHOS 4.4  --     ALB 3.0* 2.9*   TBILI 0.5 0.4   SGOT 38* 38*   ALT 19 19   INR  --  1.1     No results for input(s): PH, PCO2, PO2, HCO3, FIO2 in the last 72 hours.    24 Hour Results:  Recent Results (from the past 24 hour(s))   HEP B SURFACE AG    Collection Time: 06/17/17  9:30 AM   Result Value Ref Range    Hepatitis B surface Ag NON-REACTIVE NON-REACTIVE     HEP B SURFACE AB    Collection Time: 06/17/17  9:30 AM   Result Value Ref Range    Hepatitis B surface Ab REACTIVE     TROPONIN I    Collection Time: 06/17/17  2:30 PM   Result Value Ref Range    Troponin-I 0.487 (H) 0.000 - 0.045 ng/ml   EKG, 12 LEAD, INITIAL    Collection Time: 06/17/17  2:33 PM   Result Value Ref Range    Ventricular Rate 104 BPM    Atrial Rate 104 BPM    P-R Interval 194 ms    QRS Duration 88 ms    Q-T Interval 358 ms    QTC Calculation (Bezet) 470 ms    Calculated P Axis 69 degrees    Calculated R Axis 1 degrees    Calculated T Axis 90 degrees    Diagnosis       Sinus  tachycardia  Possible Left atrial enlargement  Nonspecific T wave abnormality  Abnormal ECG  When compared with ECG of 17-Jun-2017 04:59, (Unconfirmed)  No significant change was found  Confirmed by Oleta Mouse, M.D., Krista Blue ([id number]) on 06/17/2017 4:40:22 PM     PHOSPHORUS    Collection Time: 06/18/17  4:55 AM   Result Value Ref Range    Phosphorus 4.4 2.5 - 4.9 mg/dl   METABOLIC PANEL, COMPREHENSIVE    Collection Time: 06/18/17  4:55 AM   Result Value Ref Range    Sodium 143 136 - 145 mEq/L    Potassium 5.2 (H) 3.5 - 5.1 mEq/L    Chloride 104 98 - 107 mEq/L    CO2 30 21 - 32 mEq/L    Glucose 81 74 - 106 mg/dl    BUN 37 (H) 7 - 25 mg/dl    Creatinine 7.6 (H) 0.6 - 1.3 mg/dl    GFR est AA 10.0      GFR est non-AA 8      Calcium 6.8 (L) 8.5 - 10.1 mg/dl    AST (SGOT) 38 (H) 15 - 37 U/L    ALT (SGPT) 19 12 - 78 U/L    Alk. phosphatase 251 (H) 45 - 117 U/L    Bilirubin, total 0.5 0.2 - 1.0 mg/dl    Protein, total 6.7 6.4 - 8.2 gm/dl    Albumin 3.0 (L) 3.4 - 5.0 gm/dl    Anion gap 10 5 - 15 mmol/L    MAGNESIUM    Collection Time: 06/18/17  4:55 AM   Result Value Ref Range    Magnesium 2.1 1.6 - 2.6 mg/dl   CBC WITH AUTOMATED DIFF    Collection Time: 06/18/17  4:55 AM   Result Value Ref Range    WBC 5.7 4.0 - 11.0 1000/mm3    RBC 2.70 (L) 3.80 - 5.70 M/uL    HGB 8.1 (L) 12.4 - 17.2 gm/dl    HCT 26.8 (L) 37.0 - 50.0 %    MCV 99.3 (H) 80.0 - 98.0 fL    MCH 30.0 23.0 - 34.6 pg    MCHC 30.2 30.0 - 36.0 gm/dl    PLATELET 119 (L) 140 - 450 1000/mm3    MPV 10.0 6.0 - 10.0 fL    RDW-SD 60.4 (H) 35.1 - 43.9      NRBC 0 0 - 0      IMMATURE GRANULOCYTES 0.4 0.0 - 3.0 %    NEUTROPHILS 71.2 (H) 34 - 64 %    LYMPHOCYTES 6.7 (L) 28 - 48 %    MONOCYTES 9.0 1 - 13 %    EOSINOPHILS 12.2 (H) 0 - 5 %    BASOPHILS 0.5 0 - 3 %   AMMONIA    Collection Time: 06/18/17  4:55 AM   Result Value Ref Range    Ammonia <10 (L) 25 - 32 umol/L   T4, FREE    Collection Time: 06/18/17  4:55 AM   Result Value Ref Range    Free T4 1.00 0.76 - 1.46 ng/dl   TSH 3RD GENERATION    Collection Time: 06/18/17  4:55 AM   Result Value Ref Range    TSH 2.780 0.358 - 3.740 uIU/mL       Xr Chest Sngl V    Result Date: 06/17/2017  INDICATION: chest pain, sob   EXAMINATION: XR CHEST SNGL V COMPARISON: None FINDINGS: Mild cardiomegaly.. The lungs are clear . Right subclavian stent and dialysis catheter.  IMPRESSION: 1. Mild cardiomegaly. 2. No acute pulmonary process.       Microbiology:  All Micro Results     Procedure Component Value Units Date/Time    CULTURE, STOOL [[id number]]     Order Status:  No result Specimen:  Stool     C. DIFFICILE/EPI PCR [[id number]]     Order Status:  No result Specimen:  Stool     CULTURE, ANAEROBIC [[id number]]     Order Status:  No result Specimen:  Ascitic Fluid     CULTURE, BODY FLUID Verdie Drown [[id number]]     Order Status:  No result Specimen:  Ascitic Fluid            Cardiology:  Results for orders placed or performed during the hospital encounter of 06/17/17   EKG, 12 LEAD, INITIAL   Result Value Ref Range    Ventricular  Rate 104 BPM    Atrial Rate 104 BPM    P-R Interval 194 ms    QRS Duration 88 ms    Q-T Interval 358 ms    QTC Calculation (Bezet) 470 ms    Calculated P Axis 69 degrees    Calculated R Axis 1 degrees    Calculated T Axis 90 degrees    Diagnosis       Sinus tachycardia  Possible Left atrial enlargement  Nonspecific T wave abnormality  Abnormal ECG  When compared with ECG of 17-Jun-2017 04:59, (Unconfirmed)  No significant change was found  Confirmed by Oleta Mouse, M.D., Krista Blue ([id number]) on 06/17/2017 4:40:22 PM           Problem List:  Problem List as of 06/18/2017 Never Reviewed          Codes Class Noted - Resolved    Coronary syndrome, acute ([place]) ICD-10-CM: I24.9  ICD-9-CM: 411.1  06/17/2017 - Present        Chest pain ICD-10-CM: R07.9  ICD-9-CM: 786.50  02/07/2017 - Present        CAD (coronary artery disease) ICD-10-CM: I25.10  ICD-9-CM: 414.00  02/07/2017 - Present        ESRD (end stage renal disease) ([place]) ICD-10-CM: N18.6  ICD-9-CM: 585.6  02/07/2017 - Present        HTN (hypertension) ICD-10-CM: I10  ICD-9-CM: 401.9  02/07/2017 - Present        Ascites ICD-10-CM: R18.8  ICD-9-CM: 789.59  02/07/2017 - Present        Diarrhea ICD-10-CM: R19.7  ICD-9-CM: 787.91  02/07/2017 - Present        Nausea & vomiting ICD-10-CM: R11.2  ICD-9-CM: 787.01  02/07/2017 - Present        HTN (hypertension), malignant ICD-10-CM: I10  ICD-9-CM: 401.0  02/07/2017 - Present              Signed:  Kristen Loader, MD  06/18/2017

## 2017-06-19 ENCOUNTER — Encounter: Payer: Self-pay | Admitting: Emergency Medicine

## 2017-06-19 ENCOUNTER — Emergency Department
Admission: EM | Admit: 2017-06-19 | Discharge: 2017-06-19 | Disposition: A | Discharge status: Home / Self Care | Payer: Medicare Other | Attending: Student in an Organized Health Care Education/Training Program | Admitting: Student in an Organized Health Care Education/Training Program

## 2017-06-19 ENCOUNTER — Emergency Department: Payer: Medicare Other

## 2017-06-19 DIAGNOSIS — J45909 Unspecified asthma, uncomplicated: Secondary | ICD-10-CM | POA: Insufficient documentation

## 2017-06-19 DIAGNOSIS — R079 Chest pain, unspecified: Secondary | ICD-10-CM

## 2017-06-19 DIAGNOSIS — F17200 Nicotine dependence, unspecified, uncomplicated: Secondary | ICD-10-CM | POA: Insufficient documentation

## 2017-06-19 DIAGNOSIS — R197 Diarrhea, unspecified: Secondary | ICD-10-CM | POA: Diagnosis not present

## 2017-06-19 DIAGNOSIS — I252 Old myocardial infarction: Secondary | ICD-10-CM | POA: Diagnosis not present

## 2017-06-19 HISTORY — DX: Disorder of kidney and ureter, unspecified: N28.9

## 2017-06-19 HISTORY — DX: Acute myocardial infarction, unspecified: I21.9

## 2017-06-19 HISTORY — DX: Other ascites: R18.8

## 2017-06-19 HISTORY — DX: Unspecified asthma, uncomplicated: J45.909

## 2017-06-19 LAB — BASIC METABOLIC PANEL
ANION GAP: 15 (ref 5–15)
BUN: 54 mg/dL — ABNORMAL HIGH (ref 6–20)
CHLORIDE: 101 mmol/L (ref 101–111)
CO2: 23 mmol/L (ref 22–32)
Calcium: 7 mg/dL — ABNORMAL LOW (ref 8.9–10.3)
Creatinine, Ser: 9.04 mg/dL — ABNORMAL HIGH (ref 0.61–1.24)
GFR calc Af Amer: 7 mL/min — ABNORMAL LOW (ref >60)
GFR, EST NON AFRICAN AMERICAN: 6 mL/min — AB (ref >60)
GLUCOSE: 104 mg/dL — AB (ref 65–99)
Potassium: 5.2 mmol/L — ABNORMAL HIGH (ref 3.5–5.1)
Sodium: 139 mmol/L (ref 135–145)

## 2017-06-19 LAB — CBC
HEMATOCRIT: 23.6 % — AB (ref 40.0–52.0)
HEMOGLOBIN: 7.7 g/dL — AB (ref 13.0–18.0)
MCH: 30.5 pg (ref 26.0–34.0)
MCHC: 32.7 g/dL (ref 32.0–36.0)
MCV: 93.2 fL (ref 80.0–100.0)
Platelets: 118 10*3/uL — ABNORMAL LOW (ref 150–440)
RBC: 2.53 MIL/uL — ABNORMAL LOW (ref 4.40–5.90)
RDW: 16.9 % — ABNORMAL HIGH (ref 11.5–14.5)
WBC: 5.5 10*3/uL (ref 3.8–10.6)

## 2017-06-19 LAB — TROPONIN I: Troponin I: 0.2 ng/mL (ref <0.03)

## 2017-06-19 MED ORDER — IPRATROPIUM-ALBUTEROL 0.5-2.5 (3) MG/3ML IN SOLN
3.0000 mL | Freq: Once | RESPIRATORY_TRACT | Status: DC
  Filled 2017-06-19: qty 3

## 2017-06-19 MED ORDER — PROMETHAZINE HCL 25 MG/ML IJ SOLN: 12.5000 mg | Freq: Four times a day (QID) | INTRAMUSCULAR | Status: DC | PRN

## 2017-06-19 MED ORDER — NITROGLYCERIN 0.4 MG/HR TD PT24
1.00 | MEDICATED_PATCH | TRANSDERMAL | Status: DC
Start: 2017-06-20 — End: 2017-06-19

## 2017-06-19 MED ORDER — METRONIDAZOLE IN NACL 5-0.79 MG/ML-% IV SOLN
500.0000 mg | Freq: Once | INTRAVENOUS | Status: DC
  Filled 2017-06-19: qty 100

## 2017-06-19 MED ORDER — VANCOMYCIN 50 MG/ML ORAL SOLUTION
125.0000 mg | Freq: Four times a day (QID) | ORAL | Status: DC
  Filled 2017-06-19 (×3): qty 2.5

## 2017-06-19 NOTE — Discharge Instructions (Signed)
I have recommended that you be admitted to the hospital for treatment of chest pain and need for dialysis.  You are leaving against medical advice.  Please return if you change your mind about seeking medical care if you have worsening symptoms or for any additional questions.

## 2017-06-19 NOTE — ED Provider Notes (Signed)
[facility] Emergency Department Provider Note    First MD Initiated Contact with Patient 06/19/17 1918     (approximate)  I have reviewed the triage vital signs and the nursing notes.   HISTORY  Chief Complaint Chest Pain and Diarrhea    HPI Nathan Lynn is a 47 y.o. male for chronic medical conditions including cardiac disease on chronic O2 as well as ESRD on dialysis who presents to the ER today after [facility] with recommendation for admission for cardiology evaluation but leaving AMA after he did not receive narcotics for his pain presents with chief complaint of 6-7 episodes of diarrhea.  On review patient was tested positive for C. difficile and has history of chronic C. difficile.  Clearly has some component of narcotic dependence.  Does describe right-sided chest pain with some shortness of breath.  [facility] regarding recommendation for admission and patient refusing care and leaving AMA.  Past Medical History:  Diagnosis Date  . Ascites   . Asthma   . Myocardial infarct (HCC)   . Renal disorder    No family history on file. Past Surgical History:  Procedure Laterality Date  . DIALYSIS FISTULA CREATION    . EXCHANGE OF A DIALYSIS CATHETER    . INGUINAL HERNIA REPAIR Right   . UMBILICAL HERNIA REPAIR     There are no active problems to display for this patient.     Prior to Admission medications   Not on File    Allergies Lisinopril; Morphine and related; and Toradol [ketorolac tromethamine]    Social History Social History   Tobacco Use  . Smoking status: Current Every Day Smoker  . Smokeless tobacco: Never Used  Substance Use Topics  . Alcohol use: No    Frequency: Never  . Drug use: No    Review of Systems Patient denies headaches, rhinorrhea, blurry vision, numbness, shortness of breath, chest pain, edema, cough, abdominal pain, nausea,  vomiting, diarrhea, dysuria, fevers, rashes or hallucinations unless otherwise stated above in HPI. ____________________________________________   PHYSICAL EXAM:  VITAL SIGNS: Vitals:   06/19/17 1925  BP: (!) 161/95  Pulse: (!) 101  Resp: 20  Temp: 97.9 F (36.6 C)  SpO2: 100%    Constitutional: Alert and oriented. Ill appearing in mild resp distress on home o2 Eyes: Conjunctivae are normal.  Head: Atraumatic. Nose: No congestion/rhinnorhea. Mouth/Throat: Mucous membranes are moist.   Neck: No stridor. Painless ROM.  Cardiovascular: Normal rate, regular rhythm. Grossly normal heart sounds.  Good peripheral circulation. Respiratory: Normal respiratory effort.  No retractions. Lungs with diffuse wheeze and posterior crackles Gastrointestinal: Soft and nontender. + distension without peritonitis. No abdominal bruits. No CVA tenderness. Genitourinary: deferred Musculoskeletal: No lower extremity tenderness nor edema.  No joint effusions. Neurologic:  Normal speech and language. No gross focal neurologic deficits are appreciated. No facial droop Skin:  Skin is warm, dry and intact. No rash noted. Psychiatric: Mood and affect are normal. Speech and behavior are normal.  ____________________________________________   LABS (all labs ordered are listed, but only abnormal results are displayed)  Results for orders placed or performed during the hospital encounter of 06/19/17 (from the past 24 hour(s))  CBC     Status: Abnormal   Collection Time: 06/19/17  7:32 PM  Result Value Ref Range   WBC 5.5 3.8 - 10.6 K/uL   RBC 2.53 (L) 4.40 - 5.90 MIL/uL  Hemoglobin 7.7 (L) 13.0 - 18.0 g/dL   HCT 16.1 (L) 09.6 - 04.5 %   MCV 93.2 80.0 - 100.0 fL   MCH 30.5 26.0 - 34.0 pg   MCHC 32.7 32.0 - 36.0 g/dL   RDW 40.9 (H) 81.1 - 91.4 %   Platelets 118 (L) 150 - 440 K/uL   ____________________________________________  EKG My review and personal interpretation at Time: 19:23   Indication:  chest pain  Rate: 105  Rhythm: sinus Axis: normal Other: poor r wave progression, no stemi ____________________________________________  RADIOLOGY  I personally reviewed all radiographic images ordered to evaluate for the above acute complaints and reviewed radiology reports and findings.  These findings were personally discussed with the patient.  Please see medical record for radiology report.  ____________________________________________   PROCEDURES  Procedure(s) performed:  Procedures    Critical Care performed: no ____________________________________________   INITIAL IMPRESSION / ASSESSMENT AND PLAN / ED COURSE  Pertinent labs & imaging results that were available during my care of the patient were reviewed by me and considered in my medical decision making (see chart for details).  DDX: ACS, pericarditis, esophagitis, boerhaaves, pe, dissection, pna, bronchitis, costochondritis, megacolon, hyperkalemia   Nathan Lynn is a 47 y.o. who presents to the ED with was as described above.  EKG shows no evidence of peaked T waves.  QT is normal.  Patient likely does need medical admission after reviewing charts and events at [facility] this morning including frequent runs of ventricular tachycardia requiring magnesium and IV calcium.  Patient was admitted to cardiology service for elevated troponin and was recommended to have dialysis which the patient subsequently refused.  Will recommend similar here.  Given his diarrhea will treat given his history of C. difficile.  Clinical Course as of Jun 19 2020  Nathan Lynn Jun 19, 2017  2000 Patient states that he wants to leave after I informed him that I would not be prescribing Dilaudid for his C. difficile which he states that he gets.  His abdominal exam is soft and benign.  Patient likely does need dialysis potassium at [facility] it was 5.3 and 5.3 again here today and he does not have any respiratory distress.  Does have elevated troponin  which may be secondary to missing dialysis.  Does have a long history of documented drug-seeking behavior which I am concerned is contributing to his presentation today as he has been seen at 3 different ERs in the past several days for similar symptoms including [place] this morning.  Appropriate medical recommendations for admission and medical management was performed at [facility] and again I am recommending the patient be admitted to the hospital for medical management.  Patient demonstrates understanding that leaving again is likely to result in worsening harm including pain and even death from cardiac death, worsening electrolyte abnormality or toxic megacolon.  Patient demonstrated understanding and states that he will leave AGAINST MEDICAL ADVICE.  [PR]    Clinical Course User Index [PR] Willy Eddy, MD     ____________________________________________   FINAL CLINICAL IMPRESSION(S) / ED DIAGNOSES  Final diagnoses:  Chest pain, unspecified type  Diarrhea, unspecified type      NEW MEDICATIONS STARTED DURING THIS VISIT:  There are no discharge medications for this patient.    Note:  This document was prepared using Dragon voice recognition software and may include unintentional dictation errors.    Willy Eddy, MD 06/19/17 2022

## 2017-06-19 NOTE — ED Notes (Signed)
Lab called back about critical troponin, Dr Roxan Hockeyobinson made aware, pt was counseled prior to leaving AMA by Dr Roxan Hockeyobinson

## 2017-06-19 NOTE — ED Triage Notes (Signed)
Pt comes into the ED via POV c/o left chest pain that started this morning.  Patient was also diagnosed with c-diff on Thursday and has yet to get his antibiotics for it.  Patient also c/o shortness of breath worse than normal.  Patient is chronically on 3-4L due to severe asthma.  Patient arrived by cab with no O2 on at this time.  Patient states he has left side arm and jaw pain and has nausea associated with the chest pain.

## 2017-06-19 NOTE — ED Notes (Signed)
Patient stopped several times walking out of the hospital due to shortness of breath. Patient was encouraged to stay to be treated. Patient states he is going to dialysis tomorrow and doesn't want to stay here. Dr. Roxan Hockeyobinson informed. Patient states he has a cell phone and is going to call a cab to take him to his sister's. First nurse also aware.

## 2017-06-22 LAB — CULTURE, BLOOD: Culture result:: NO GROWTH

## 2017-06-23 LAB — HEMOGLOBIN A1C W/O EAG: Hemoglobin A1c: 5 % (ref 4.2–6.3)

## 2017-07-21 ENCOUNTER — Emergency Department: Admit: 2017-07-21 | Payer: MEDICARE

## 2017-07-21 ENCOUNTER — Inpatient Hospital Stay
Admit: 2017-07-21 | Discharge: 2017-07-21 | Disposition: A | Discharge status: Other Acute Inpatient Hospital | Payer: MEDICARE | Attending: Emergency Medicine

## 2017-07-21 ENCOUNTER — Emergency Department

## 2017-07-21 DIAGNOSIS — E875 Hyperkalemia: Secondary | ICD-10-CM

## 2017-07-21 LAB — METABOLIC PANEL, BASIC
Anion gap: 13 mmol/L (ref 5–15)
BUN/Creatinine ratio: 7 — ABNORMAL LOW (ref 12–20)
BUN: 81 MG/DL — ABNORMAL HIGH (ref 6–20)
CO2: 23 mmol/L (ref 21–32)
Calcium: 5.8 MG/DL — CL (ref 8.5–10.1)
Chloride: 101 mmol/L (ref 97–108)
Creatinine: 11.24 MG/DL — ABNORMAL HIGH (ref 0.70–1.30)
GFR est AA: 6 mL/min/{1.73_m2} — ABNORMAL LOW (ref >60)
GFR est non-AA: 5 mL/min/{1.73_m2} — ABNORMAL LOW (ref >60)
Glucose: 77 mg/dL (ref 65–100)
Potassium: 6.9 mmol/L — CR (ref 3.5–5.1)
Sodium: 137 mmol/L (ref 136–145)

## 2017-07-21 LAB — CBC WITH AUTOMATED DIFF
ABS. BASOPHILS: 0.1 10*3/uL (ref 0.0–0.1)
ABS. EOSINOPHILS: 0.5 10*3/uL — ABNORMAL HIGH (ref 0.0–0.4)
ABS. IMM. GRANS.: 0 10*3/uL (ref 0.00–0.04)
ABS. LYMPHOCYTES: 0.3 10*3/uL — ABNORMAL LOW (ref 0.8–3.5)
ABS. MONOCYTES: 0.4 10*3/uL (ref 0.0–1.0)
ABS. NEUTROPHILS: 4.4 10*3/uL (ref 1.8–8.0)
ABSOLUTE NRBC: 0 10*3/uL (ref 0.00–0.01)
BASOPHILS: 1 % (ref 0–1)
EOSINOPHILS: 9 % — ABNORMAL HIGH (ref 0–7)
HCT: 23.2 % — ABNORMAL LOW (ref 36.6–50.3)
HGB: 7.4 g/dL — ABNORMAL LOW (ref 12.1–17.0)
IMMATURE GRANULOCYTES: 0 % (ref 0.0–0.5)
LYMPHOCYTES: 6 % — ABNORMAL LOW (ref 12–49)
MCH: 31.4 PG (ref 26.0–34.0)
MCHC: 31.9 g/dL (ref 30.0–36.5)
MCV: 98.3 FL (ref 80.0–99.0)
MONOCYTES: 7 % (ref 5–13)
MPV: 10.3 FL (ref 8.9–12.9)
NEUTROPHILS: 77 % — ABNORMAL HIGH (ref 32–75)
NRBC: 0 PER 100 WBC
PLATELET: 138 10*3/uL — ABNORMAL LOW (ref 150–400)
RBC: 2.36 M/uL — ABNORMAL LOW (ref 4.10–5.70)
RDW: 17.9 % — ABNORMAL HIGH (ref 11.5–14.5)
WBC: 5.7 10*3/uL (ref 4.1–11.1)

## 2017-07-21 LAB — TROPONIN I: Troponin-I, Qt.: 0.1 ng/mL — ABNORMAL HIGH (ref <0.05)

## 2017-07-21 LAB — NT-PRO BNP: NT pro-BNP: >35000 PG/ML — ABNORMAL HIGH (ref 0–125)

## 2017-07-21 MED ORDER — INSULIN REGULAR HUMAN 100 UNIT/ML INJECTION
100 unit/mL | INTRAMUSCULAR | Status: DC
Start: 2017-07-21 — End: 2017-07-21

## 2017-07-21 MED ORDER — CALCIUM GLUCONATE 100 MG/ML (10%) IV SOLN
100 mg/mL (10%) | INTRAVENOUS | Status: DC
Start: 2017-07-21 — End: 2017-07-21

## 2017-07-21 MED ORDER — DIPHENHYDRAMINE HCL 50 MG/ML IJ SOLN
50 mg/mL | INTRAMUSCULAR | Status: AC
Start: 2017-07-21 — End: 2017-07-21
  Administered 2017-07-21: 18:00:00 via INTRAVENOUS

## 2017-07-21 MED ORDER — DEXTROSE 50% IN WATER (D50W) IV SYRG
INTRAVENOUS | Status: DC
Start: 2017-07-21 — End: 2017-07-21

## 2017-07-21 MED ORDER — ALBUTEROL SULFATE 0.083 % (0.83 MG/ML) SOLN FOR INHALATION
2.5 mg /3 mL (0.083 %) | RESPIRATORY_TRACT | Status: AC
Start: 2017-07-21 — End: 2017-07-21
  Administered 2017-07-21: 18:00:00 via RESPIRATORY_TRACT

## 2017-07-21 MED ORDER — IPRATROPIUM-ALBUTEROL 2.5 MG-0.5 MG/3 ML NEB SOLUTION
2.5 mg-0.5 mg/3 ml | RESPIRATORY_TRACT | Status: AC
Start: 2017-07-21 — End: 2017-07-21
  Administered 2017-07-21: 17:00:00 via RESPIRATORY_TRACT

## 2017-07-21 MED ORDER — SODIUM CHLORIDE 0.9 % IV
100 mg/mL (10%) | Freq: Once | INTRAVENOUS | Status: AC
Start: 2017-07-21 — End: 2017-07-21
  Administered 2017-07-21: 18:00:00 via INTRAVENOUS

## 2017-07-21 MED ORDER — HYDRALAZINE 20 MG/ML IJ SOLN
20 mg/mL | INTRAMUSCULAR | Status: AC
Start: 2017-07-21 — End: 2017-07-21
  Administered 2017-07-21: 17:00:00 via INTRAVENOUS

## 2017-07-21 MED ORDER — SODIUM CHLORIDE 0.9 % IJ SYRG
Freq: Three times a day (TID) | INTRAMUSCULAR | Status: DC
Start: 2017-07-21 — End: 2017-07-21
  Administered 2017-07-21: 19:00:00 via INTRAVENOUS

## 2017-07-21 MED ORDER — ONDANSETRON (PF) 4 MG/2 ML INJECTION
4 mg/2 mL | INTRAMUSCULAR | Status: AC
Start: 2017-07-21 — End: 2017-07-21
  Administered 2017-07-21: 17:00:00 via INTRAVENOUS

## 2017-07-21 MED ORDER — HYDROMORPHONE (PF) 1 MG/ML IJ SOLN
1 mg/mL | INTRAMUSCULAR | Status: AC
Start: 2017-07-21 — End: 2017-07-21
  Administered 2017-07-21: 18:00:00 via INTRAVENOUS

## 2017-07-21 MED FILL — CALCIUM GLUCONATE 100 MG/ML (10%) IV SOLN: 100 mg/mL (10%) | INTRAVENOUS | Qty: 10

## 2017-07-21 MED FILL — ALBUTEROL SULFATE 0.083 % (0.83 MG/ML) SOLN FOR INHALATION: 2.5 mg /3 mL (0.083 %) | RESPIRATORY_TRACT | Qty: 1

## 2017-07-21 MED FILL — DIPHENHYDRAMINE HCL 50 MG/ML IJ SOLN: 50 mg/mL | INTRAMUSCULAR | Qty: 1

## 2017-07-21 MED FILL — HYDRALAZINE 20 MG/ML IJ SOLN: 20 mg/mL | INTRAMUSCULAR | Qty: 1

## 2017-07-21 MED FILL — IPRATROPIUM-ALBUTEROL 2.5 MG-0.5 MG/3 ML NEB SOLUTION: 2.5 mg-0.5 mg/3 ml | RESPIRATORY_TRACT | Qty: 3

## 2017-07-21 MED FILL — DEXTROSE 50% IN WATER (D50W) IV SYRG: INTRAVENOUS | Qty: 50

## 2017-07-21 MED FILL — INSULIN REGULAR HUMAN 100 UNIT/ML INJECTION: 100 unit/mL | INTRAMUSCULAR | Qty: 1

## 2017-07-21 MED FILL — ONDANSETRON (PF) 4 MG/2 ML INJECTION: 4 mg/2 mL | INTRAMUSCULAR | Qty: 2

## 2017-07-21 MED FILL — HYDROMORPHONE (PF) 1 MG/ML IJ SOLN: 1 mg/mL | INTRAMUSCULAR | Qty: 1

## 2017-07-21 NOTE — ED Notes (Signed)
Pt reports to ED with c/o SOB and chest pain since this morning. Pt reported he had dialysis yesterday. Pt stated he was treated for c-diff and started having "watery diarrhea" since Sunday and states he thinks his C-diff has returned.     Pt is alert, oriented and appropriate. Pt is able to speak In full complete sentences without difficulty. Bilateral arm swelling noted. 2+ pitting edema in bilateral legs. Rhonchi noted in bilateral lung fields.     Emergency Department Nursing Plan of Care       The Nursing Plan of Care is developed from the Nursing assessment and Emergency Department Attending provider initial evaluation.  The plan of care may be reviewed in the ???ED Provider note???.    The Plan of Care was developed with the following considerations:   Patient / Family readiness to learn indicated ZO:[id number]:verbalized understanding  Persons(s) to be included in education: patient  Barriers to Learning/Limitations:No    Signed     Karie SodaKelsey J Reid    07/21/2017   1:14 PM

## 2017-07-21 NOTE — ED Provider Notes (Signed)
EMERGENCY DEPARTMENT HISTORY AND PHYSICAL EXAM      Date: 07/21/2017  Patient Name: David [name]    History of Presenting Illness     Chief Complaint   Patient presents with   ??? Shortness of Breath   ??? Chest Pain       History Provided By: Patient    HPI: Izetta Dakin, 47 y.o. male with PMHx significant for HTN, ESRD on hemodialysis MWF, MI s/p PCI/stent placement x2, C. difficile colitis, SBP, CHF on home O2, ascites, presents via private vehicle to the ED with cc of diffuse abdominal pain and N/V/D x 5 days, as well as SOB, mid-sternal non-radiating CP, and swelling in his BL arms x this morning. Pt reports that he had his last dialysis yesterday and states that he has ~ 3-4L of fluid taken off. He notes a hx of frequent paracentesis for fluid overload. Pt states "it feels like my arms are going to explode." He reports that he has not taken any of his daily medications in 5 days secondary to being unable to tolerate PO. Pt states that he recently had C.Diff 1 month ago. He notes that he is on 3-4L O2 at home at all times. Pt reports he has been using his nebulizer at home w/o any relief. He states he does not make urine. Pt denies any alleviating/exacerbating factors for his current sx's. He specifically denies any fever, chills, HA, or rash.     There are no other complaints, changes, or physical findings at this time.    Social Hx: - EtOH; + Smoker (1/4 ppd); - Illicit Drugs    PCP: None    Current Facility-Administered Medications   Medication Dose Route Frequency Provider Last Rate Last Dose   ??? sodium chloride (NS) flush 5-40 mL  5-40 mL IntraVENous Q8H Ramiro Pangilinan, Vickey Huger, MD       ??? dextrose (D50W) injection syrg 25 g  50 mL IntraVENous NOW Hanad Leino, Vickey Huger, MD       ??? insulin regular (NOVOLIN R, HUMULIN R) injection 10 Units  10 Units IntraVENous NOW Zaydyn Havey, Vickey Huger, MD       ??? HYDROmorphone (PF) (DILAUDID) injection 1 mg  1 mg IntraVENous NOW Cela Newcom, Vickey Huger, MD        ??? calcium gluconate 1 g in 0.9% sodium chloride 100 mL IVPB  1 g IntraVENous ONCE Elham Fini, Vickey Huger, MD         Current Outpatient Medications   Medication Sig Dispense Refill   ??? hydrALAZINE (APRESOLINE) 100 mg tablet Take  by mouth three (3) times daily.     ??? amLODIPine (NORVASC) 10 mg tablet Take 10 mg by mouth daily.     ??? carvedilol (COREG) 12.5 mg tablet Take 12.5 mg by mouth two (2) times daily (with meals).     ??? sod phos di, mono/K phos mono (PHOSPHOROUS PO) Take  by mouth.     ??? albuterol (PROVENTIL VENTOLIN) 2.5 mg /3 mL (0.083 %) nebulizer solution by Nebulization route once.     ??? carvedilol (COREG) 12.5 mg tablet Take 12.5 mg by mouth two (2) times daily (with meals).     ??? hydrALAZINE (APRESOLINE) 100 mg tablet Take 100 mg by mouth three (3) times daily.     ??? sevelamer (RENAGEL) 800 mg tablet Take 800 mg by mouth three (3) times daily (with meals).     ??? amLODIPine (NORVASC) 10 mg tablet Take 10 mg by mouth daily.     ???  albuterol (PROVENTIL HFA, VENTOLIN HFA, PROAIR HFA) 90 mcg/actuation inhaler Take 1 Puff by inhalation every four (4) hours as needed for Wheezing or Shortness of Breath.     ??? furosemide (LASIX) 80 mg tablet Take 80 mg by mouth daily.     ??? sevelamer carbonate (RENVELA) 800 mg tab tab Take 800 mg by mouth three (3) times daily (with meals).     ??? hydrALAZINE (APRESOLINE) 100 mg tablet Take 100 mg by mouth three (3) times daily.     ??? amLODIPine (NORVASC) 10 mg tablet Take 10 mg by mouth daily.     ??? carvedilol (COREG) 12.5 mg tablet Take 12.5 mg by mouth two (2) times daily (with meals).     ??? sevelamer (RENAGEL) 400 mg tablet Take 800 mg by mouth three (3) times daily (with meals).     ??? albuterol (PROVENTIL HFA, VENTOLIN HFA, PROAIR HFA) 90 mcg/actuation inhaler Take 2 Puffs by inhalation every four (4) hours as needed for Wheezing.     ??? carvedilol (COREG) 12.5 mg tablet Take 12.5 mg by mouth two (2) times daily (with meals).      ??? hydrALAZINE (APRESOLINE) 100 mg tablet Take 100 mg by mouth three (3) times daily.     ??? amLODIPine (NORVASC) 10 mg tablet Take 10 mg by mouth daily.     ??? furosemide (LASIX) 80 mg tablet Take 80 mg by mouth daily.     ??? albuterol (PROVENTIL HFA, VENTOLIN HFA, PROAIR HFA) 90 mcg/actuation inhaler Take 2 Puffs by inhalation every four (4) hours as needed for Wheezing.         Past History     Past Medical History:  Past Medical History:   Diagnosis Date   ??? Adverse effect of anesthesia     sleep apnea uses oxygen at night    ??? Ascites    ??? Asthma    ??? C. difficile colitis    ??? CAD (coronary artery disease)    ??? Chronic kidney disease    ??? Chronic respiratory failure with hypoxia (HCC)     on 3-4L n/c at home.   ??? ESRD (end stage renal disease) (HCC)    ??? ESRD (end stage renal disease) on dialysis [facility])    ??? History of cardiac cath     with 2 stents   ??? HTN (hypertension)    ??? Hypertension    ??? Ill-defined condition     Ascites   ??? Infectious disease    ??? MI (myocardial infarction) (HCC) 2010   ??? Myocardial infarct (HCC)    ??? Myocardial infarction [facility])    ??? OSA (obstructive sleep apnea)     reports does not have cpap   ??? Peritoneal dialysis catheter infection (HCC)    ??? SBP (spontaneous bacterial peritonitis) (HCC)    ??? Vomiting 08/20/2016       Past Surgical History:  Past Surgical History:   Procedure Laterality Date   ??? ABDOMEN SURGERY PROC UNLISTED      umbilical hernia and right groin hernia 2016   ??? COLONOSCOPY N/A 03/04/2016    COLONOSCOPY performed by Sandy Salaam, MD at [facility]   ??? HX CORONARY STENT PLACEMENT     ??? HX CORONARY STENT PLACEMENT  2010   ??? HX GI      Groin & Hernia repair   ??? HX HERNIA REPAIR  2008    right inguinal and umbilical   ??? HX VASCULAR ACCESS  Hx of one site on R, 2 on L, currently dialyzing through L Tunnel cath.   ??? HX VASCULAR ACCESS      PD Catheter placed and removed   ??? SIGMOIDOSCOPY,DIAGNOSTIC  03/04/2016            Family History:  Family History    Problem Relation Age of Onset   ??? Hypertension Other    ??? Hypertension Mother    ??? Kidney Disease Neg Hx    ??? Liver Disease Neg Hx        Social History:  Social History     Tobacco Use   ??? Smoking status: Current Every Day Smoker     Packs/day: 0.25   ??? Smokeless tobacco: Never Used   ??? Tobacco comment: 3 cigs. daily   Substance Use Topics   ??? Alcohol use: No     Frequency: Never   ??? Drug use: No       Allergies:  Allergies   Allergen Reactions   ??? Lisinopril Anaphylaxis   ??? Morphine Anaphylaxis   ??? Toradol [Ketorolac] Anaphylaxis   ??? Lisinopril Shortness of Breath   ??? Lisinopril Swelling   ??? Lisinopril Shortness of Breath and Angioedema   ??? Lisinopril Other (comments)     "messes with my breathing"   ??? Morphine Swelling   ??? Morphine Shortness of Breath and Angioedema   ??? Percocet [Oxycodone-Acetaminophen] Anxiety   ??? Toradol [Ketorolac] Shortness of Breath   ??? Toradol [Ketorolac] Swelling   ??? Toradol [Ketorolac] Shortness of Breath and Angioedema   ??? Toradol [Ketorolac] Other (comments)     "messes with my breathing"     ??? Tramadol Shortness of Breath       Review of Systems   Review of Systems   Constitutional: Negative for chills and fever.   HENT: Negative for sore throat and trouble swallowing.    Eyes: Negative for photophobia and redness.   Respiratory: Positive for shortness of breath. Negative for cough.    Cardiovascular: Positive for chest pain. Negative for leg swelling.   Gastrointestinal: Positive for abdominal pain, diarrhea, nausea and vomiting. Negative for constipation.   Endocrine: Negative for polydipsia and polyuria.   Genitourinary: Negative for dysuria, hematuria and scrotal swelling.   Musculoskeletal: Positive for joint swelling (BL arms). Negative for back pain.   Skin: Negative for rash.   Neurological: Negative for dizziness, syncope, weakness and headaches.   Psychiatric/Behavioral: Negative for suicidal ideas.   All other systems reviewed and are negative.    Physical Exam    Physical Exam   Constitutional: He is oriented to person, place, and time. He appears well-developed and well-nourished.   HENT:   Head: Normocephalic and atraumatic.   Mouth/Throat: Oropharynx is clear and moist and mucous membranes are normal.   Eyes: EOM are normal.   Neck: Normal range of motion and full passive range of motion without pain. Neck supple.   Cardiovascular: Normal rate, regular rhythm, normal heart sounds, intact distal pulses and normal pulses.   No murmur heard.  Pulmonary/Chest: Effort normal. No respiratory distress. He has decreased breath sounds in the right lower field. He has rhonchi (scattered). He exhibits no tenderness.   Dialysis port-o-cath L CW   Abdominal: Soft. Normal appearance and bowel sounds are normal. There is no tenderness. There is no rebound and no guarding.   Musculoskeletal:        Right forearm: He exhibits swelling.        Left  forearm: He exhibits swelling.        Right lower leg: He exhibits edema (2+ pitting).        Left lower leg: He exhibits edema (2+ pitting).   Neurological: He is alert and oriented to person, place, and time. He has normal strength.   Skin: Skin is warm, dry and intact. No rash noted. No erythema.   Psychiatric: He has a normal mood and affect. His speech is normal and behavior is normal. Judgment and thought content normal.   Nursing note and vitals reviewed.    Diagnostic Study Results     Labs -     Recent Results (from the past 12 hour(s))   CBC WITH AUTOMATED DIFF    Collection Time: 07/21/17 12:45 PM   Result Value Ref Range    WBC 5.7 4.1 - 11.1 K/uL    RBC 2.36 (L) 4.10 - 5.70 M/uL    HGB 7.4 (L) 12.1 - 17.0 g/dL    HCT 16.1 (L) 09.6 - 50.3 %    MCV 98.3 80.0 - 99.0 FL    MCH 31.4 26.0 - 34.0 PG    MCHC 31.9 30.0 - 36.5 g/dL    RDW 04.5 (H) 40.9 - 14.5 %    PLATELET 138 (L) 150 - 400 K/uL    MPV 10.3 8.9 - 12.9 FL    NRBC 0.0 0 PER 100 WBC    ABSOLUTE NRBC 0.00 0.00 - 0.01 K/uL    NEUTROPHILS 77 (H) 32 - 75 %     LYMPHOCYTES 6 (L) 12 - 49 %    MONOCYTES 7 5 - 13 %    EOSINOPHILS 9 (H) 0 - 7 %    BASOPHILS 1 0 - 1 %    IMMATURE GRANULOCYTES 0 0.0 - 0.5 %    ABS. NEUTROPHILS 4.4 1.8 - 8.0 K/UL    ABS. LYMPHOCYTES 0.3 (L) 0.8 - 3.5 K/UL    ABS. MONOCYTES 0.4 0.0 - 1.0 K/UL    ABS. EOSINOPHILS 0.5 (H) 0.0 - 0.4 K/UL    ABS. BASOPHILS 0.1 0.0 - 0.1 K/UL    ABS. IMM. GRANS. 0.0 0.00 - 0.04 K/UL    DF SMEAR SCANNED      RBC COMMENTS ANISOCYTOSIS  1+       METABOLIC PANEL, BASIC    Collection Time: 07/21/17 12:45 PM   Result Value Ref Range    Sodium 137 136 - 145 mmol/L    Potassium 6.9 (HH) 3.5 - 5.1 mmol/L    Chloride 101 97 - 108 mmol/L    CO2 23 21 - 32 mmol/L    Anion gap 13 5 - 15 mmol/L    Glucose 77 65 - 100 mg/dL    BUN 81 (H) 6 - 20 MG/DL    Creatinine 81.19 (H) 0.70 - 1.30 MG/DL    BUN/Creatinine ratio 7 (L) 12 - 20      GFR est AA 6 (L) >60 ml/min/1.15m2    GFR est non-AA 5 (L) >60 ml/min/1.16m2    Calcium 5.8 (LL) 8.5 - 10.1 MG/DL   TROPONIN I    Collection Time: 07/21/17 12:45 PM   Result Value Ref Range    Troponin-I, Qt. 0.10 (H) <0.05 ng/mL   NT-PRO BNP    Collection Time: 07/21/17 12:45 PM   Result Value Ref Range    NT pro-BNP >35,000 (H) 0 - 125 PG/ML       Radiologic Studies -   XR CHEST  PORT   Final Result   IMPRESSION:   Bibasilar atelectasis or scar, stable since 05/01/2017. No new acute disease..  .           CXR Results  (Last 48 hours)               07/21/17 1300  XR CHEST PORT Final result    Impression:  IMPRESSION:   Bibasilar atelectasis or scar, stable since 05/01/2017. No new acute disease..  .           Narrative:  INDICATION:  fever, pneumonia.       EXAM: Chest single view.       COMPARISON: 05/01/2017.       FINDINGS: A single frontal view of the chest at 1247 hours shows patchy   atelectasis or scar in the right lung base and minimally on the left, stable..    The heart, mediastinum and pulmonary vasculature are stable with moderately    severe cardiomegaly. Mesh stent material over the right subclavian region is   stable. A double-lumen catheter from the left subclavian approach shows its tip   over the right atrium. .  The bony thorax is unremarkable for age. .                 Medical Decision Making   I am the first provider for this patient.    I reviewed the vital signs, available nursing notes, past medical history, past surgical history, family history and social history.    Vital Signs-Reviewed the patient's vital signs.  Patient Vitals for the past 12 hrs:   Temp Pulse Resp BP SpO2   07/21/17 1409 ??? ??? ??? ??? 99 %   07/21/17 1348 ??? 100 24 153/86 100 %   07/21/17 1344 ??? 98 24 167/77 100 %   07/21/17 1332 ??? (!) 101 26 (!) 171/101 99 %   07/21/17 1330 ??? (!) 102 26 (!) 121/100 100 %   07/21/17 1326 ??? (!) 102 25 (!) 182/93 100 %   07/21/17 1315 ??? (!) 103 21 ??? 100 %   07/21/17 1250 ??? (!) 103 20 ??? 100 %   07/21/17 1248 ??? ??? ??? ??? 100 %   07/21/17 1232 98.3 ??F (36.8 ??C) (!) 106 24 (!) 183/113 97 %       Pulse Oximetry Analysis - 100% on 4L NC    Cardiac Monitor:   Rate: 106 bpm  Rhythm: Sinus Tachycardia      EKG interpretation: (Preliminary)1241  Rhythm: sinus tachycardia; and regular . Rate (approx.): 105; Axis: normal; PR interval: normal; QRS interval: normal ; ST/T wave: normal.  Written by Micayla L. Peterson AoAlbers, ED Scribe, as dictated by Belva CromePedro Itzel Mckibbin, MD    Records Reviewed: Nursing Notes, Old Medical Records, Previous electrocardiograms, Previous Radiology Studies and Previous Laboratory Studies    Provider Notes (Medical Decision Making):   DDx: anasarca, fluid overload, acute CHF, upper extremity DVTs, ACS    ED Course:   Initial assessment performed. The patients presenting problems have been discussed, and they are in agreement with the care plan formulated and outlined with them.  I have encouraged them to ask questions as they arise throughout their visit.    Consult Note:  2:15 PM  Belva CromePedro Trista Ciocca, MD, spoke with Westley ChandlerLisa Dodd, D.O.,   Specialty: ER Physician at [facility]  Discussed pt's hx, disposition, and available diagnostic and imaging results. Reviewed care plans. Consultant accepts pt for transfer.     Critical  Care Time:   CRITICAL CARE NOTE :  2:11 PM    IMPENDING DETERIORATION -Respiratory, Cardiovascular, Metabolic and Renal  ASSOCIATED RISK FACTORS - Hypoxia, Dysrhythmia, Metabolic changes and Dehydration  MANAGEMENT- Bedside Assessment, Supervision of Care and Transfer  INTERPRETATION -  Xrays, ECG and Blood Pressure  INTERVENTIONS - hemodynamic mngmt and Metobolic interventions  CASE REVIEW - Medical Sub-Specialist, Nursing and Family  TREATMENT RESPONSE -Stable  PERFORMED BY - Self    NOTES   :  I have spent 45 minutes of critical care time involved in lab review, consultations with specialist, family decision- making, bedside attention and documentation. During this entire length of time I was immediately available to the patient .    Belva Crome, MD      Disposition:  Transfer Note:  Patient is being transferred to ED at [facility], transfer accepted by Dr. Pollie Friar.  The reasons for patient's transfer have been discussed with the patient and available family.  They convey agreement and understanding for the need to be transferred as explained to them by Philip Aspen, MD    PLAN:  1. Transfer to ED at [facility]    Diagnosis     Clinical Impression:   1. Acute hyperkalemia    2. Acute renal failure, unspecified acute renal failure type (HCC)        This note is prepared by Micayla L. Peterson Ao, acting as Neurosurgeon for Belva Crome, MD    Belva Crome, MD: The scribe's documentation has been prepared under my direction and personally reviewed by me in its entirety. I confirm that the note above accurately reflects all work, treatment, procedures, and medical decision making performed by me.          This note will not be viewable in MyChart.

## 2017-07-21 NOTE — ED Notes (Signed)
Attempted administer insulin and Dextrose as ordered for treatment of hyperkalemia; patient refusing.  States, "give me my pain medication first."  Patient then insists that he cannot receive insulin because "it makes my sugar go low."  Attempted to explain indications for administering Insulin and Dextrose to correct hyperkalemia but patient still refusing.  MD advised that patient refused both medications.

## 2017-07-21 NOTE — ED Notes (Signed)
TRANSFER - OUT REPORT:    Verbal [facility](name[facility] on [facility] [name]  [facility]     Report consisted of patient???s Situation, Background, Assessment and   Recommendations(SBAR).     Information from the following report(s) SBAR was reviewed with the receiving nurse.    Lines:   Peripheral IV 07/21/17 Right Other(comment) (Active)        Opportunity for questions and clarification was provided.      Patient transported with:   O2 @ 3 liters   Calcium gluconate infusion   RAA ALS

## 2017-07-21 NOTE — ED Triage Notes (Signed)
Chest pain and shortness of breath since this morning. Pt has swelling in bilateral arms. Pt reports going to dialysis yesterday. PT reports abdominal pain and diarrhea since Saturday. Pt wear oxygen at home normally 3-4 liters.

## 2017-07-21 NOTE — Progress Notes (Signed)
Core Measure patient. CM opened case for assessment of D/C planning needs, CM reviewed chart. Pt presented to ED via with c/o SOB and chest pain. Pt is currently being transferred to [facility]; lights and sirens.       David PickerJasmine Royal, MSW

## 2017-07-21 NOTE — ED Notes (Signed)
Patient has called out to nurses station several times requesting pain medication for c/o pain in abdomen and BIL lower extremities. ER physician has spoken with patient regarding his concerns of receiving pain medication.  ER physician updated patient regarding current plan of are.

## 2017-07-22 ENCOUNTER — Emergency Department: Admit: 2017-07-23 | Payer: MEDICARE

## 2017-07-22 DIAGNOSIS — R079 Chest pain, unspecified: Secondary | ICD-10-CM

## 2017-07-22 LAB — EKG, 12 LEAD, INITIAL
Atrial Rate: 105 {beats}/min
Calculated P Axis: -8 degrees
Calculated R Axis: -5 degrees
Calculated T Axis: 40 degrees
P-R Interval: 198 ms
Q-T Interval: 372 ms
QRS Duration: 82 ms
QTC Calculation (Bezet): 491 ms
Ventricular Rate: 105 {beats}/min

## 2017-07-22 LAB — EKG 12-LEAD
Atrial Rate: 105 {beats}/min
P Axis: -8 degrees
P-R Interval: 198 ms
Q-T Interval: 372 ms
QRS Duration: 82 ms
QTc Calculation (Bazett): 491 ms
R Axis: -5 degrees
T Axis: 40 degrees
Ventricular Rate: 105 {beats}/min

## 2017-07-22 NOTE — ED Notes (Addendum)
Patient calling out for pain medications. States his pain remains a 10/10. States 1mg  Dilaudid "just is ridiculous, its not enough for me." MD notified.

## 2017-07-22 NOTE — Progress Notes (Signed)
Admission Medication Reconciliation:  Information obtained from: Patient     Significant PMH/Disease States:   Past Medical History:   Diagnosis Date    Arthritis     CAD (coronary artery disease)     Chronic kidney disease     Chronic obstructive pulmonary disease (HCC)     Diabetes (HCC)     Gastrointestinal disorder     Hypertension        Chief Complaint for this Admission:  CP, SOB    Allergies:  Morphine and Toradol [ketorolac]    Prior to Admission Medications:   Prior to Admission Medications   Prescriptions Last Dose Informant Patient Reported? Taking?   albuterol (PROVENTIL VENTOLIN) 2.5 mg /3 mL (0.083 %) nebulizer solution   Yes Yes   Sig: Take 1.25 mg by inhalation every four (4) hours as needed for Wheezing.   amLODIPine (NORVASC) 10 mg tablet 07/18/2017  Yes Yes   Sig: Take 10 mg by mouth daily.   carvedilol (COREG) 12.5 mg tablet   Yes Yes   Sig: Take 12.5 mg by mouth two (2) times daily (with meals).   hydrALAZINE (APRESOLINE) 100 mg tablet 07/18/2017  Yes Yes   Sig: Take 100 mg by mouth three (3) times daily.   ipratropium-albuterol (COMBIVENT RESPIMAT) 20-100 mcg/actuation inhaler   Yes Yes   Sig: Take 1 Puff by inhalation every six (6) hours.   sevelamer (RENAGEL) 400 mg tablet 07/18/2017  Yes Yes   Sig: Take 400 mg by mouth three (3) times daily (with meals).      Facility-Administered Medications: None         Comments/Recommendations: Medication history completed after speaking to the patient.  He has not had any of his medications since Monday due to his inability to "keep anything down".    Add-Combivent, Amlodipine, Carvedilol    Sherilyn CooterJoseph Morgereth, PharmD

## 2017-07-22 NOTE — ED Notes (Signed)
Dr. Lum BabeEniola assessed patient and communicated patient would be treated for kidney failure, but would not receive narcotic pain medications while in the hospital.  Patient stated if that was the case, he would leave the hospital.  Dr. Ladona Ridgelaylor notified, and completed AMA form.

## 2017-07-22 NOTE — ED Notes (Signed)
Patient calling out again for pain medication. He was notified that MD does not want to medicate him further until lab work comes back. Patient very frustrated with this answer. States his pain is not managed. He has at this time removed his cardiac monitor and BP cuff. RN has replaced all. MD made aware.

## 2017-07-22 NOTE — ED Provider Notes (Signed)
47 y.o. male with past medical history significant for ascites, asthma, C-diff, CAD, CKD, chronic respiratory failure w/ hypoxia, ESRD, HTN, MI, OSA, and spontaneous bacterial peritonitis who presents from home with chief complaint of chest pain. Pt reports he moved to [place] from [place] 5 days ago. He has been having 7-8 daily episodes of diarrhea "with blood", 5-6 daily episodes of vomiting, and generalized abdominal pain for 4 days accompanied by generalized abdominal pain. Pt also c/o L-sided chest pain for >5 hours described as "pressure, like someone sitting on (his) chest" accompanied by SOB and hand/leg swelling. When asked about fever, Pt notes his temperature was 101 F yesterday. Pt took 324 mg ASA and a dose of NTG PTA w/ no relief. Pt reports he has been doing his own hemodialysis at home for the last 6-7 months; last dialysis was 2 days ago. He states he has not done his dialysis today d/t N/V/D. Pt also notes his abdomen constantly fills with fluid from an unknown cause; he has had a negative liver biopsy. He states he is chronically on 3-4 L oxygen at home. In addition, Pt notes he was admitted to [facility] ~1 month ago for C-diff. Pt denies cough. There are no other acute medical concerns at this time.    Note written by Janece Canterbury, Scribe, as dictated by Cindi Carbon, MD 9:01 PM           No past medical history on file.    No past surgical history on file.      No family history on file.    Social History     Socioeconomic History   ??? Marital status: Not on file     Spouse name: Not on file   ??? Number of children: Not on file   ??? Years of education: Not on file   ??? Highest education level: Not on file   Occupational History   ??? Not on file   Social Needs   ??? Financial resource strain: Not on file   ??? Food insecurity:     Worry: Not on file     Inability: Not on file   ??? Transportation needs:     Medical: Not on file     Non-medical: Not on file   Tobacco Use    ??? Smoking status: Not on file   Substance and Sexual Activity   ??? Alcohol use: Not on file   ??? Drug use: Not on file   ??? Sexual activity: Not on file   Lifestyle   ??? Physical activity:     Days per week: Not on file     Minutes per session: Not on file   ??? Stress: Not on file   Relationships   ??? Social connections:     Talks on phone: Not on file     Gets together: Not on file     Attends religious service: Not on file     Active member of club or organization: Not on file     Attends meetings of clubs or organizations: Not on file     Relationship status: Not on file   ??? Intimate partner violence:     Fear of current or ex partner: Not on file     Emotionally abused: Not on file     Physically abused: Not on file     Forced sexual activity: Not on file   Other Topics Concern   ??? Not on file  Social History Narrative   ??? Not on file         ALLERGIES: Patient has no allergy information on record.    Review of Systems   Constitutional: Positive for fever.   Eyes: Negative for visual disturbance.   Respiratory: Positive for shortness of breath. Negative for cough and wheezing.    Cardiovascular: Positive for chest pain and leg swelling.   Gastrointestinal: Positive for abdominal pain, blood in stool, diarrhea, nausea and vomiting.   Genitourinary: Negative for dysuria.   Musculoskeletal: Negative.  Negative for back pain and neck stiffness.   Skin: Negative for rash.   Neurological: Negative.  Negative for syncope and headaches.   Psychiatric/Behavioral: Negative for confusion.   All other systems reviewed and are negative.      Vitals:    07/22/17 2100   BP: 171/72   Pulse: (!) 104   Resp: 12   SpO2: 99%        Physical Exam   Constitutional: He appears well-developed and well-nourished. No distress.   HENT:   Head: Normocephalic.   Eyes: Pupils are equal, round, and reactive to light.   Neck: Normal range of motion.   Cardiovascular: Regular rhythm. Tachycardia present.   No murmur heard.   Pulmonary/Chest: He is in respiratory distress (mild). He has decreased breath sounds (bilateral).   Dialysis catheter in L upper chest wall.    Abdominal: Soft. He exhibits distension. There is tenderness (minimal, generalized).   Musculoskeletal: Normal range of motion. He exhibits edema.   2+ edema in arms and legs.    Neurological: He is alert. He has normal strength. No cranial nerve deficit.   Skin: Skin is warm and dry.   Psychiatric: He has a normal mood and affect. His behavior is normal.   Nursing note and vitals reviewed.  Note written by Janece CanterburyJay F Spangler, Scribe, as dictated by Cindi Carbonaylor, Braylee Bosher, MD 9:01 PM    MDM       Procedures      ED EKG interpretation:  Rhythm: sinus tachycardia; and regular . Rate (approx.): 102; Axis: normal; ST/T wave: non-specific changes;   Note written by Janece CanterburyJay F Spangler, Scribe, as dictated by Cindi Carbonaylor, Rigby Swamy, MD 9:05 PM       PROGRESS NOTE:  10:06 PM  Pt is using an alias under this name. His real name is Izetta Dakinroy Darnell Nin, DOB Feb 20, 2071, MRN [id number], SSN [id number]. He was seen in [facility] ER [facility] yesterday. He was dx w/ acute hyperkalemia and acute renal failure, then [facility].      PROGRESS NOTE:  10:32 PM  Cindi Carbonaylor, Mishawn Didion, MD confronted Pt about him checking in under an alias. Pt admits [facility] and [facility], but he left AMA because they only wanted to do dialysis. Pt does not believe his sx are dialysis related, but based off labs and CXR, he needs to be admitted for dialysis.       CONSULT NOTE:  10:57 PM Cindi Carbonaylor, Maniyah Moller, MD spoke with Dr. Lum BabeEniola, Consult for Hospitalist.  Discussed available diagnostic tests and clinical findings.  Dr. Lum BabeEniola will evaluate Pt for admission.

## 2017-07-22 NOTE — ED Notes (Addendum)
Patient has left AMA. He has refused most medications. RN educated patient about the insulin and dextrose. Was told that if his potassium continues to rise without treatment he could have problems with his heart that could result in death. Patient states he has had this treatment before and his sugar drops too low. RN agreed to give patient several orange juices in a bag to take home to help keep his sugar up as he continues to want to leave. Patient agreed to this. He was given approximately 50oz of orange juice to go home with. Patient requested a cab. He was given the cab company phone number to call and assisted out of the department in a wheelchair.

## 2017-07-22 NOTE — ED Notes (Signed)
PROGRESS NOTE:  11:02 PM  Dr. Lum BabeEniola and Pt discussed admission. Pt was also told he needed to have renal failure treated, and that without treatment he may die. Pt elects to sign out AMA.

## 2017-07-22 NOTE — ED Triage Notes (Signed)
Patient comes from home via EMS. He reports chest pressure and SOB since 1800. Additionally has abdominal pain, nausea and vomiting since Monday. On home O2 of 3-4 L.

## 2017-07-22 NOTE — ED Notes (Signed)
Patient calling out again for pain medications. Patient is now becoming increasingly frustrated with all staff. States that pain management is important and he is in dire agony. Of note when RN not in the room, patient is resting on the stretcher with eyes closed and stable vitals.

## 2017-07-23 ENCOUNTER — Inpatient Hospital Stay
Admit: 2017-07-23 | Discharge: 2017-07-23 | Discharge status: Against Medical Advice | Payer: MEDICARE | Attending: Emergency Medicine

## 2017-07-23 LAB — CBC WITH AUTOMATED DIFF
ABS. BASOPHILS: 0 10*3/uL (ref 0.0–0.1)
ABS. EOSINOPHILS: 0.5 10*3/uL — ABNORMAL HIGH (ref 0.0–0.4)
ABS. IMM. GRANS.: 0 10*3/uL (ref 0.00–0.04)
ABS. LYMPHOCYTES: 0.3 10*3/uL — ABNORMAL LOW (ref 0.8–3.5)
ABS. MONOCYTES: 0.4 10*3/uL (ref 0.0–1.0)
ABS. NEUTROPHILS: 4.2 10*3/uL (ref 1.8–8.0)
ABSOLUTE NRBC: 0 10*3/uL (ref 0.00–0.01)
BASOPHILS: 0 % (ref 0–1)
EOSINOPHILS: 9 % — ABNORMAL HIGH (ref 0–7)
HCT: 24.3 % — ABNORMAL LOW (ref 36.6–50.3)
HGB: 7.3 g/dL — ABNORMAL LOW (ref 12.1–17.0)
IMMATURE GRANULOCYTES: 0 % (ref 0.0–0.5)
LYMPHOCYTES: 5 % — ABNORMAL LOW (ref 12–49)
MCH: 31.1 PG (ref 26.0–34.0)
MCHC: 30 g/dL (ref 30.0–36.5)
MCV: 103.4 FL — ABNORMAL HIGH (ref 80.0–99.0)
MONOCYTES: 8 % (ref 5–13)
MPV: 9.7 FL (ref 8.9–12.9)
NEUTROPHILS: 78 % — ABNORMAL HIGH (ref 32–75)
NRBC: 0 PER 100 WBC
PLATELET: 106 10*3/uL — ABNORMAL LOW (ref 150–400)
RBC: 2.35 M/uL — ABNORMAL LOW (ref 4.10–5.70)
RDW: 18.1 % — ABNORMAL HIGH (ref 11.5–14.5)
WBC: 5.4 10*3/uL (ref 4.1–11.1)

## 2017-07-23 LAB — METABOLIC PANEL, COMPREHENSIVE
A-G Ratio: 0.8 — ABNORMAL LOW (ref 1.1–2.2)
ALT (SGPT): 17 U/L (ref 12–78)
AST (SGOT): 30 U/L (ref 15–37)
Albumin: 2.9 g/dL — ABNORMAL LOW (ref 3.5–5.0)
Alk. phosphatase: 264 U/L — ABNORMAL HIGH (ref 45–117)
Anion gap: 12 mmol/L (ref 5–15)
BUN/Creatinine ratio: 8 — ABNORMAL LOW (ref 12–20)
BUN: 96 MG/DL — ABNORMAL HIGH (ref 6–20)
Bilirubin, total: 0.3 MG/DL (ref 0.2–1.0)
CO2: 22 mmol/L (ref 21–32)
Calcium: 5.8 MG/DL — CL (ref 8.5–10.1)
Chloride: 106 mmol/L (ref 97–108)
Creatinine: 12.7 MG/DL — ABNORMAL HIGH (ref 0.70–1.30)
GFR est AA: 5 mL/min/{1.73_m2} — ABNORMAL LOW (ref >60)
GFR est non-AA: 4 mL/min/{1.73_m2} — ABNORMAL LOW (ref >60)
Globulin: 3.7 g/dL (ref 2.0–4.0)
Glucose: 97 mg/dL (ref 65–100)
Potassium: 5.9 mmol/L — ABNORMAL HIGH (ref 3.5–5.1)
Protein, total: 6.6 g/dL (ref 6.4–8.2)
Sodium: 140 mmol/L (ref 136–145)

## 2017-07-23 LAB — EKG, 12 LEAD, INITIAL
Atrial Rate: 102 {beats}/min
Calculated P Axis: 4 degrees
Calculated R Axis: -3 degrees
Calculated T Axis: 59 degrees
P-R Interval: 196 ms
Q-T Interval: 366 ms
QRS Duration: 86 ms
QTC Calculation (Bezet): 477 ms
Ventricular Rate: 102 {beats}/min

## 2017-07-23 LAB — CK W/ CKMB & INDEX
CK - MB: 71.8 NG/ML — ABNORMAL HIGH (ref <3.6)
CK-MB Index: 2.8 — ABNORMAL HIGH (ref 0.0–2.5)
CK: 2567 U/L — ABNORMAL HIGH (ref 39–308)

## 2017-07-23 LAB — MAGNESIUM: Magnesium: 2.1 mg/dL (ref 1.6–2.4)

## 2017-07-23 LAB — NT-PRO BNP: NT pro-BNP: >35000 PG/ML — ABNORMAL HIGH (ref <125)

## 2017-07-23 LAB — TROPONIN I: Troponin-I, Qt.: 0.12 ng/mL — ABNORMAL HIGH (ref <0.05)

## 2017-07-23 LAB — SAMPLES BEING HELD

## 2017-07-23 LAB — POC LACTIC ACID: Lactic Acid (POC): 0.67 mmol/L (ref 0.40–2.00)

## 2017-07-23 LAB — EKG 12-LEAD
Atrial Rate: 102 {beats}/min
P Axis: 4 degrees
P-R Interval: 196 ms
Q-T Interval: 366 ms
QRS Duration: 86 ms
QTc Calculation (Bazett): 477 ms
R Axis: -3 degrees
T Axis: 59 degrees
Ventricular Rate: 102 {beats}/min

## 2017-07-23 MED ORDER — INSULIN REGULAR HUMAN 100 UNIT/ML INJECTION
100 unit/mL | INTRAMUSCULAR | Status: AC
Start: 2017-07-23 — End: 2017-07-22
  Administered 2017-07-23: 03:00:00 via INTRAVENOUS

## 2017-07-23 MED ORDER — SODIUM BICARBONATE 8.4 % (1 MEQ/ML) IV SYRG
8.4 % (1 mEq/mL) | INTRAVENOUS | Status: DC
Start: 2017-07-23 — End: 2017-07-23

## 2017-07-23 MED ORDER — CALCIUM GLUCONATE 100 MG/ML (10%) IV SOLN
100 mg/mL (10%) | INTRAVENOUS | Status: DC
Start: 2017-07-23 — End: 2017-07-22

## 2017-07-23 MED ORDER — ONDANSETRON (PF) 4 MG/2 ML INJECTION
4 mg/2 mL | INTRAMUSCULAR | Status: AC
Start: 2017-07-23 — End: 2017-07-22
  Administered 2017-07-23: 01:00:00 via INTRAVENOUS

## 2017-07-23 MED ORDER — CALCIUM GLUCONATE 100 MG/ML (10%) IV SOLN
100 mg/mL (10%) | Freq: Once | INTRAVENOUS | Status: DC
Start: 2017-07-23 — End: 2017-07-23

## 2017-07-23 MED ORDER — HYDROMORPHONE (PF) 1 MG/ML IJ SOLN
1 mg/mL | INTRAMUSCULAR | Status: AC
Start: 2017-07-23 — End: 2017-07-22
  Administered 2017-07-23: 01:00:00 via INTRAVENOUS

## 2017-07-23 MED ORDER — ALBUTEROL SULFATE HFA 90 MCG/ACTUATION AEROSOL INHALER
90 mcg/actuation | Freq: Four times a day (QID) | RESPIRATORY_TRACT | 1 refills | Status: AC | PRN
Start: 2017-07-23 — End: 2017-07-29

## 2017-07-23 MED ORDER — ALBUTEROL SULFATE HFA 90 MCG/ACTUATION AEROSOL INHALER
90 mcg/actuation | Freq: Four times a day (QID) | RESPIRATORY_TRACT | 1 refills | Status: DC | PRN
Start: 2017-07-23 — End: 2017-07-22

## 2017-07-23 MED ORDER — ALBUTEROL SULFATE 0.083 % (0.83 MG/ML) SOLN FOR INHALATION
2.5 mg /3 mL (0.083 %) | RESPIRATORY_TRACT | Status: DC
Start: 2017-07-23 — End: 2017-07-23

## 2017-07-23 MED ORDER — DEXTROSE 50% IN WATER (D50W) IV SYRG
INTRAVENOUS | Status: AC
Start: 2017-07-23 — End: 2017-07-22
  Administered 2017-07-23: 03:00:00 via INTRAVENOUS

## 2017-07-23 MED FILL — INSULIN REGULAR HUMAN 100 UNIT/ML INJECTION: 100 unit/mL | INTRAMUSCULAR | Qty: 1

## 2017-07-23 MED FILL — HYDROMORPHONE (PF) 1 MG/ML IJ SOLN: 1 mg/mL | INTRAMUSCULAR | Qty: 1

## 2017-07-23 MED FILL — DEXTROSE 50% IN WATER (D50W) IV SYRG: INTRAVENOUS | Qty: 50

## 2017-07-23 MED FILL — SODIUM BICARBONATE 8.4 % (1 MEQ/ML) IV SYRG: 8.4 % (1 mEq/mL) | INTRAVENOUS | Qty: 50

## 2017-07-23 MED FILL — ONDANSETRON (PF) 4 MG/2 ML INJECTION: 4 mg/2 mL | INTRAMUSCULAR | Qty: 4

## 2017-07-23 MED FILL — CALCIUM GLUCONATE 100 MG/ML (10%) IV SOLN: 100 mg/mL (10%) | INTRAVENOUS | Qty: 10

## 2017-07-25 ENCOUNTER — Emergency Department: Admit: 2017-07-26 | Payer: MEDICARE

## 2017-07-25 ENCOUNTER — Observation Stay

## 2017-07-25 DIAGNOSIS — R079 Chest pain, unspecified: Secondary | ICD-10-CM

## 2017-07-25 LAB — CULTURE, BLOOD, PAIRED: Culture result:: NO GROWTH

## 2017-07-25 NOTE — ED Triage Notes (Signed)
Patient arrives by pov with shortness of breath and chest pain that started today.  States that he has had abdominal pain and diarrhea as well.  Chest pain started today.  Has had bilateral arm swelling.  Is a dialysis patient.  Last dialysis was today.

## 2017-07-25 NOTE — H&P (Addendum)
[facility]  Hospitalist Admission Note      NAME:  David [name]   DOB:   04-20-1970   MRN:  [id number]     PCP:  None     Date/Time:  07/26/2017 11:29 PM          Subjective:     CHIEF COMPLAINT: pain    HISTORY OF PRESENT ILLNESS:     David [name] is a 47 y.o.  African American male who presented to the Emergency Department complaining of pain.  He is a vague historian. Multiple ER and hospital visits for chest pain and other pain.  Tonight he reports chest pain and abd pain and arm pain.  He tolerated his usual HD this AM in [place] and drove to [place] and came to our ER.  He reports diarrhea.  He reports fever 101 yesterday.  He has chronic severe ascites.  Labs are unremarkable.  Our records show he on his last admission at a [facility] he left AMA refusing to get an ECHO and disagreeing with pain management. We will admit him for observation.      Past Medical History:   Diagnosis Date   ??? Arthritis    ??? Ascites    ??? Asthma    ??? C. difficile colitis    ??? CAD (coronary artery disease)    ??? Chronic obstructive pulmonary disease (HCC)    ??? Chronic respiratory failure with hypoxia (HCC)     on 3-4L n/c at home.   ??? 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 ESRD (end stage renal disease) on dialysis [facility])    ??? HTN (hypertension)    ??? OSA (obstructive sleep apnea)     reports does not have cpap        Past Surgical History:   Procedure Laterality Date   ??? ABDOMEN SURGERY PROC UNLISTED      umbilical hernia and right groin hernia 2016   ??? COLONOSCOPY N/A 03/04/2016    COLONOSCOPY performed by Sandy Salaam, MD at [facility]   ??? HX CORONARY STENT PLACEMENT     ??? HX CORONARY STENT PLACEMENT  2010   ??? HX GI      Groin & Hernia repair   ??? HX HERNIA REPAIR  2008    right inguinal and umbilical   ??? HX VASCULAR ACCESS      Hx of one site on R, 2 on L, currently dialyzing through L Tunnel cath.   ??? HX VASCULAR ACCESS      PD Catheter placed and removed    ??? SIGMOIDOSCOPY,DIAGNOSTIC  03/04/2016            Social History     Tobacco Use   ??? Smoking status: Never Smoker   ??? Smokeless tobacco: Never Used   ??? Tobacco comment: 3 cigs. daily   Substance Use Topics   ??? Alcohol use: Never     Frequency: Never        Family History   Problem Relation Age of Onset   ??? Hypertension Other    ??? Hypertension Mother    ??? Kidney Disease Neg Hx    ??? Liver Disease Neg Hx       Family hx cannot be fully assessed, due to the admitting conditions    Allergies   Allergen Reactions   ??? Lisinopril Anaphylaxis   ??? Morphine Anaphylaxis   ??? Toradol [Ketorolac] Anaphylaxis   ???  Lisinopril Shortness of Breath   ??? Lisinopril Swelling   ??? Lisinopril Shortness of Breath and Angioedema   ??? Lisinopril Other (comments)     "messes with my breathing"   ??? Morphine Swelling   ??? Morphine Shortness of Breath and Angioedema   ??? Morphine Unknown (comments)   ??? Percocet [Oxycodone-Acetaminophen] Anxiety   ??? Toradol [Ketorolac] Shortness of Breath   ??? Toradol [Ketorolac] Swelling   ??? Toradol [Ketorolac] Shortness of Breath and Angioedema   ??? Toradol [Ketorolac] Other (comments)     "messes with my breathing"     ??? Toradol [Ketorolac] Unknown (comments)   ??? Tramadol Shortness of Breath        Prior to Admission medications    Medication Sig Start Date End Date Taking? Authorizing Provider   ipratropium-albuterol (COMBIVENT RESPIMAT) 20-100 mcg/actuation inhaler Take 1 Puff by inhalation every six (6) hours.    Provider, Historical   albuterol (PROVENTIL HFA, VENTOLIN HFA, PROAIR HFA) 90 mcg/actuation inhaler Take 2 Puffs by inhalation every six (6) hours as needed for Wheezing for up to 7 days. 07/22/17 07/29/17  Cindi Carbonaylor, Gary, MD   sod phos di, mono/K phos mono (PHOSPHOROUS PO) Take  by mouth.    Other, Phys, MD   albuterol (PROVENTIL VENTOLIN) 2.5 mg /3 mL (0.083 %) nebulizer solution by Nebulization route once.    Other, Phys, MD   amLODIPine (NORVASC) 10 mg tablet Take 10 mg by mouth daily.    Other, Phys, MD    sevelamer carbonate (RENVELA) 800 mg tab tab Take 800 mg by mouth three (3) times daily (with meals).    Provider, Historical   sevelamer (RENAGEL) 400 mg tablet Take 800 mg by mouth three (3) times daily (with meals).    Provider, Historical   carvedilol (COREG) 12.5 mg tablet Take 12.5 mg by mouth two (2) times daily (with meals).    Other, Phys, MD   hydrALAZINE (APRESOLINE) 100 mg tablet Take 100 mg by mouth three (3) times daily.    Other, Phys, MD   furosemide (LASIX) 80 mg tablet Take 80 mg by mouth daily.    Other, Phys, MD       Review of Systems:  (bold if positive, if negative)    Gen:  fatigueEyes:  ENT:  CVS:  chest painPulm:  GI:  Abdominal pain, nauseadiarrheaGU:  MS:  Pain, weakness, swelling, arthritisSkin:  Psych:  depression, anxiety, Endo:  Hem:  Renal:  Neuro:  weakness      Objective:      VITALS:    Vital signs reviewed; most recent are:    Visit Vitals  BP (!) 195/112   Pulse (!) 114   Resp 20   SpO2 98%     SpO2 Readings from Last 6 Encounters:   07/25/17 98%   07/22/17 99%   07/21/17 99%   06/17/17 100%   06/16/17 93%   05/03/17 95%        No intake or output data in the 24 hours ending 07/26/17 0003     Exam:     Physical Exam:    Gen:  Well-developed, well-nourished, in no acute distress  HEENT:  Pink conjunctivae, PERRL, hearing intact to voice, moist mucous membranes  Neck:  Supple, without masses, thyroid non-tender  Resp:  No accessory muscle use, clear breath sounds without wheezes rales or rhonchi  Card:  No murmurs, normal S1, S2 without thrills, bruits or peripheral edema  Abd:  Soft, non-tender, distended, scarred,  reduced bowel sounds are present, no mass  Lymph:  No cervical or inguinal adenopathy  Musc:  No cyanosis or clubbing  Skin:  No rashes or ulcers, skin turgor is good  Neuro:  Cranial nerves are grossly intact, mild motor weakness, follows commands vaguely  Psych:  Poor insight, oriented to person, place and time, alert     Labs:    Recent Labs     07/25/17  2148    WBC 5.4   HGB 6.9*   HCT 22.3*   PLT 112*     Recent Labs     07/25/17  2148   NA 139   K 5.0   CL 104   CO2 24   GLU 103*   BUN 72*   CREA 10.80*   CA 6.0*   MG 2.2   ALB 2.6*   TBILI 0.4   SGOT 37   ALT 17     Lab Results   Component Value Date/Time    Glucose (POC) 98 10/22/2016 03:26 PM    Glucose (POC) 84 07/21/2016 05:03 AM    Glucose (POC) 96 03/04/2016 02:04 PM    Glucose (POC) 105 (H) 02/29/2016 11:05 AM     No results for input(s): PH, PCO2, PO2, HCO3, FIO2 in the last 72 hours.  No results for input(s): INR in the last 72 hours.    No lab exists for component: INREXT, INREXT  All Micro Results     Procedure Component Value Units Date/Time    CULTURE, STOOL [[id number]]     Order Status:  Sent Specimen:  Stool     OVA & PARASITES, STOOL [[id number]]     Order Status:  Sent Specimen:  Stool from Feces     C. DIFFICILE AG & TOXIN A/B [[id number]]     Order Status:  Sent Specimen:  Stool           I have reviewed previous records       Assessment and Plan:      Pain / Other chest pain / Arthritis / hand pain / Generalized abdominal pain - POA, recurrent.  I doubt AMI.  Likely chronic pain.  Monitor tele and consult cards.  Check ECHO.  Further cardiac workup only if ECHO changes from prior.    Ascites / Anasarca associated with disorder of kidney / Abdominal distention - Gets biweekly paracentesis.  Consult GI    ESRD (end stage renal disease) on dialysis - Consult renal to handle HD.  No urgent HD need.  Continue sevelamer    H/O noncompliance with medical treatment, presenting hazards to health - Advise compliance.  I have a suspicion he is here seeking narcotic pain prescription.   Addendum 12:30 AM- After learning his pain would not be treated with IV dilaudid, the patient refused to take any other medicines and asked to leave AMA    Anemia / Thrombocytopenia - Chronic.  EPO per renal.  Transfusion threshold decision by renal.    Biventricular CHF (congestive heart failure) / Tricuspid regurgitation /  CAD (coronary artery disease) / Hypertension / Elevated troponin - Appears stable. Troponin elevation is chronic. Continue norvasc, coreg, hydralazine and lasix. He didn't take these today.    Diarrhea - Resolving. Hx of C diff but recently finished vanco.  I doubt current C diff.  Even if he tests positive for C diff, he does not show any sign of colitis or active infection.    Chronic respiratory failure with hypoxia /  Chronic obstructive pulmonary disease / Pulmonary hypertension / Asthma / Oxygen dependent / OSA (obstructive sleep apnea) - POA, stable.  Continue prn albuterol. He is non compliant with CPAP.    DM type 2 causing renal disease - Not on meds.  Monitor.  I doubt this DX is currently true.     Telemetry reviewed:   normal sinus rhythm    Risk of deterioration: high      Total time spent with patient: 73 Minutes I reviewed chart, notes, data and current medications in the medical record.  I have examined and treated the patient at bedside during this period.                 Care Plan discussed with: Patient, Nursing Staff and >50% of time spent in counseling and coordination of care    Discussed:  Care Plan       ___________________________________________________    Attending Physician: Salvadore Dom, MD

## 2017-07-25 NOTE — ED Provider Notes (Signed)
47 y.o. male with past medical history significant for ESRD, c. Difficile colitis, SBP, vomiting, myocardial infarct, OSA, chronic respiratory failure with hypoxia, peritoneal dialysis catheter infection, HTN, asthma, ascites, cardiac cath, arthritis, CAD, CKD, COPD, diabetes, and GI disorder who presents from home with chief complaint of chest pain. Patient states that he began experiencing diarrhea 3 days ago.  Yesterday, patient reports that he developed a fever of "101 degrees F."  He claims that his fever has since resolved and that he was able to receive his full treatment of dialysis today.  He reports that 3 to 4 L were removed at dialysis today.  This evening, patient reports that he began developing chest pain, SOB, abdominal pain, and bilateral upper extremity swelling.  Patient describes his chest pain as a pressure similar to "someone sitting on his chest."  He denies worsening of his chest pain with exertion.  He adds that his "hands and arms feel like they are going to burst open."  He also complains of associated nausea, diaphoresis, and abdominal swelling.  Patient mentions that he feels like his "C. diff is back."  He reports that he has been taking vancomycin and flagyl for recent C.diff.  Of note, patient admits to previous h/o ascites and states that he has this drained every 2 weeeks.  Patient mentions that he had a port placed in July of 2018 after his fistula became infected and was removed.  He previously visited the ED on 07/22/17 for chest pain and on 07/21/17 for acute hyperkalemia.  He has been admitted to the hospital on 06/17/17 for coronary syndrome, on 06/16/17 for acute chest pain, on 05/01/17 for chest pain, on 04/21/17 for anemia, on 02/07/17 for chest pain, and on 02/04/17 for acute chest pain.  Patient also visited the ED on 04/21/17 for acute colitis, on 04/08/17 for chest pain, on 02/11/17 for acute hyperkalemia, and on 02/06/17 for fever.  Patient denies leg swelling, constipation, and  headache.  There are no other acute medical concerns at this time.    Old Chart Review: [facility].  His troponin is chronically elevated.  He is an end stage renal disease patient.      Social hx: unknown tobacco use; negative alcohol use; negative drug use  PCP: Other, Phys, MD    Note written by Richardean ChimeraFranklin Gergoudis, Scribe, as dictated by Clemens Catholicitchner, Harm Jou J., MD 9:04 PM    The history is provided by the patient. No language interpreter was used.        Past Medical History:   Diagnosis Date   ??? Adverse effect of anesthesia     sleep apnea uses oxygen at night    ??? Arthritis    ??? Ascites    ??? Asthma    ??? C. difficile colitis    ??? CAD (coronary artery disease)    ??? Chronic kidney disease    ??? Chronic obstructive pulmonary disease (HCC)    ??? Chronic respiratory failure with hypoxia (HCC)     on 3-4L n/c at home.   ??? Diabetes (HCC)    ??? ESRD (end stage renal disease) (HCC)    ??? ESRD (end stage renal disease) on dialysis [facility](HCC)    ??? Gastrointestinal disorder    ??? History of cardiac cath     with 2 stents   ??? HTN (hypertension)    ??? Hypertension    ??? Ill-defined condition     Ascites   ???  Infectious disease    ??? MI (myocardial infarction) (HCC) 2010   ??? Myocardial infarct (HCC)    ??? Myocardial infarction [facility])    ??? OSA (obstructive sleep apnea)     reports does not have cpap   ??? Peritoneal dialysis catheter infection (HCC)    ??? SBP (spontaneous bacterial peritonitis) (HCC)    ??? Vomiting 08/20/2016       Past Surgical History:   Procedure Laterality Date   ??? ABDOMEN SURGERY PROC UNLISTED      umbilical hernia and right groin hernia 2016   ??? COLONOSCOPY N/A 03/04/2016    COLONOSCOPY performed by Sandy Salaam, MD at [facility]   ??? HX CORONARY STENT PLACEMENT     ??? HX CORONARY STENT PLACEMENT  2010   ??? HX GI      Groin & Hernia repair   ??? HX HERNIA REPAIR  2008    right inguinal and umbilical   ??? HX VASCULAR ACCESS       Hx of one site on R, 2 on L, currently dialyzing through L Tunnel cath.   ??? HX VASCULAR ACCESS      PD Catheter placed and removed   ??? SIGMOIDOSCOPY,DIAGNOSTIC  03/04/2016              Family History:   Problem Relation Age of Onset   ??? Hypertension Other    ??? Hypertension Mother    ??? Kidney Disease Neg Hx    ??? Liver Disease Neg Hx        Social History     Socioeconomic History   ??? Marital status: SINGLE     Spouse name: Not on file   ??? Number of children: Not on file   ??? Years of education: Not on file   ??? Highest education level: Not on file   Occupational History   ??? Not on file   Social Needs   ??? Financial resource strain: Not on file   ??? Food insecurity:     Worry: Not on file     Inability: Not on file   ??? Transportation needs:     Medical: Not on file     Non-medical: Not on file   Tobacco Use   ??? Smoking status: Never Smoker   ??? Smokeless tobacco: Never Used   ??? Tobacco comment: 3 cigs. daily   Substance and Sexual Activity   ??? Alcohol use: Never     Frequency: Never   ??? Drug use: No   ??? Sexual activity: Not Currently   Lifestyle   ??? Physical activity:     Days per week: Not on file     Minutes per session: Not on file   ??? Stress: Not on file   Relationships   ??? Social connections:     Talks on phone: Not on file     Gets together: Not on file     Attends religious service: Not on file     Active member of club or organization: Not on file     Attends meetings of clubs or organizations: Not on file     Relationship status: Not on file   ??? Intimate partner violence:     Fear of current or ex partner: Not on file     Emotionally abused: Not on file     Physically abused: Not on file     Forced sexual activity: Not on file   Other Topics Concern   ???  Not on file   Social History Narrative    ** Merged History Encounter **         ** Merged History Encounter **         ** Merged History Encounter **         ** Merged History Encounter **         ** Merged History Encounter **               ALLERGIES: Lisinopril; Morphine; Toradol [ketorolac]; Lisinopril; Lisinopril; Lisinopril; Lisinopril; Morphine; Morphine; Morphine; Percocet [oxycodone-acetaminophen]; Toradol [ketorolac]; Toradol [ketorolac]; Toradol [ketorolac]; Toradol [ketorolac]; Toradol [ketorolac]; and Tramadol    Review of Systems   Constitutional: Positive for diaphoresis. Negative for chills and fever (resolved).   HENT: Negative for ear pain and sore throat.    Eyes: Negative for pain.   Respiratory: Positive for shortness of breath. Negative for chest tightness.    Cardiovascular: Positive for chest pain. Negative for leg swelling.   Gastrointestinal: Positive for abdominal pain, diarrhea and nausea. Negative for vomiting.   Genitourinary: Negative for dysuria and flank pain.   Musculoskeletal: Positive for myalgias (upper extremity pain). Negative for back pain.        Bilateral upper extremity swelling   Skin: Negative for rash.   Neurological: Negative for headaches.   All other systems reviewed and are negative.      There were no vitals filed for this visit.         Physical Exam   Constitutional: No distress.   Chronically ill appearing   HENT:   Head: Normocephalic and atraumatic.   Eyes: Pupils are equal, round, and reactive to light. Conjunctivae are normal. No scleral icterus.   Neck: No tracheal deviation present.   Cardiovascular: Regular rhythm. Tachycardia present.   Pulmonary/Chest: Effort normal. No stridor. No respiratory distress. He has rales (bilateral bases).   Left chest dialysis catheter present   Abdominal: Soft. He exhibits ascites. He exhibits no distension. There is no tenderness.   Musculoskeletal: He exhibits edema (1+ pitting edema bilaterally). He exhibits no deformity.   Bilateral upper extremity edema   Neurological: He is alert.   Skin: Skin is warm and dry.   Psychiatric: He has a normal mood and affect.   Nursing note and vitals reviewed.      Note written by Richardean Chimera, Scribe, as dictated by Clemens Catholic., MD 9:04 PM    MDM  Number of Diagnoses or Management Options  Chest pain, unspecified type:   Hypocalcemia:   Diagnosis management comments: 47 year old male with end-stage renal disease presents with chest pain and watery diarrhea. Has a history of C. difficile and thinks she has again. Chest pain is severe and was unrelieved by multiple doses of IV narcotic medicine. EKG without any acute ischemia. Labs at baseline with elevated troponin and hypocalcemia. Admit to hospitalist for further management.         Procedures           [employer]. Antoine Primas, MD

## 2017-07-25 NOTE — ED Notes (Signed)
Pt Throughput: Press photographerCharge Nurse on 5th Floor  made aware of patient's bed assignment, room 524.      Prentiss BellsFarrah A Bichsel, RN  Emergency Dept Charge RN.

## 2017-07-26 ENCOUNTER — Inpatient Hospital Stay: Admit: 2017-07-26 | Payer: MEDICARE | Attending: Internal Medicine

## 2017-07-26 ENCOUNTER — Inpatient Hospital Stay
Admit: 2017-07-26 | Discharge: 2017-07-26 | Discharge status: Against Medical Advice | Payer: MEDICARE | Attending: Emergency Medicine

## 2017-07-26 LAB — CBC WITH AUTOMATED DIFF
ABS. BASOPHILS: 0.1 10*3/uL (ref 0.0–0.1)
ABS. EOSINOPHILS: 0.5 10*3/uL — ABNORMAL HIGH (ref 0.0–0.4)
ABS. IMM. GRANS.: 0 10*3/uL (ref 0.00–0.04)
ABS. LYMPHOCYTES: 0.3 10*3/uL — ABNORMAL LOW (ref 0.8–3.5)
ABS. MONOCYTES: 0.5 10*3/uL (ref 0.0–1.0)
ABS. NEUTROPHILS: 4 10*3/uL (ref 1.8–8.0)
ABSOLUTE NRBC: 0 10*3/uL (ref 0.00–0.01)
BASOPHILS: 1 % (ref 0–1)
EOSINOPHILS: 10 % — ABNORMAL HIGH (ref 0–7)
HCT: 22.3 % — ABNORMAL LOW (ref 36.6–50.3)
HGB: 6.9 g/dL — ABNORMAL LOW (ref 12.1–17.0)
IMMATURE GRANULOCYTES: 0 % (ref 0.0–0.5)
LYMPHOCYTES: 5 % — ABNORMAL LOW (ref 12–49)
MCH: 31.7 PG (ref 26.0–34.0)
MCHC: 30.9 g/dL (ref 30.0–36.5)
MCV: 102.3 FL — ABNORMAL HIGH (ref 80.0–99.0)
MONOCYTES: 10 % (ref 5–13)
MPV: 9.7 FL (ref 8.9–12.9)
NEUTROPHILS: 74 % (ref 32–75)
NRBC: 0 PER 100 WBC
PLATELET: 112 10*3/uL — ABNORMAL LOW (ref 150–400)
RBC: 2.18 M/uL — ABNORMAL LOW (ref 4.10–5.70)
RDW: 18 % — ABNORMAL HIGH (ref 11.5–14.5)
WBC: 5.4 10*3/uL (ref 4.1–11.1)

## 2017-07-26 LAB — METABOLIC PANEL, COMPREHENSIVE
A-G Ratio: 0.7 — ABNORMAL LOW (ref 1.1–2.2)
ALT (SGPT): 17 U/L (ref 12–78)
AST (SGOT): 37 U/L (ref 15–37)
Albumin: 2.6 g/dL — ABNORMAL LOW (ref 3.5–5.0)
Alk. phosphatase: 244 U/L — ABNORMAL HIGH (ref 45–117)
Anion gap: 11 mmol/L (ref 5–15)
BUN/Creatinine ratio: 7 — ABNORMAL LOW (ref 12–20)
BUN: 72 MG/DL — ABNORMAL HIGH (ref 6–20)
Bilirubin, total: 0.4 MG/DL (ref 0.2–1.0)
CO2: 24 mmol/L (ref 21–32)
Calcium: 6 MG/DL — CL (ref 8.5–10.1)
Chloride: 104 mmol/L (ref 97–108)
Creatinine: 10.8 MG/DL — ABNORMAL HIGH (ref 0.70–1.30)
GFR est AA: 6 mL/min/{1.73_m2} — ABNORMAL LOW (ref >60)
GFR est non-AA: 5 mL/min/{1.73_m2} — ABNORMAL LOW (ref >60)
Globulin: 3.7 g/dL (ref 2.0–4.0)
Glucose: 103 mg/dL — ABNORMAL HIGH (ref 65–100)
Potassium: 5 mmol/L (ref 3.5–5.1)
Protein, total: 6.3 g/dL — ABNORMAL LOW (ref 6.4–8.2)
Sodium: 139 mmol/L (ref 136–145)

## 2017-07-26 LAB — EKG, 12 LEAD, INITIAL
Atrial Rate: 114 {beats}/min
Calculated P Axis: 61 degrees
Calculated R Axis: 5 degrees
Calculated T Axis: 42 degrees
P-R Interval: 152 ms
Q-T Interval: 386 ms
QRS Duration: 78 ms
QTC Calculation (Bezet): 532 ms
Ventricular Rate: 114 {beats}/min

## 2017-07-26 LAB — SAMPLES BEING HELD

## 2017-07-26 LAB — MAGNESIUM: Magnesium: 2.2 mg/dL (ref 1.6–2.4)

## 2017-07-26 LAB — LIPASE: Lipase: 311 U/L (ref 73–393)

## 2017-07-26 LAB — TROPONIN I: Troponin-I, Qt.: 0.17 ng/mL — ABNORMAL HIGH (ref <0.05)

## 2017-07-26 LAB — EKG 12-LEAD
Atrial Rate: 114 {beats}/min
P Axis: 61 degrees
P-R Interval: 152 ms
Q-T Interval: 386 ms
QRS Duration: 78 ms
QTc Calculation (Bazett): 532 ms
R Axis: 5 degrees
T Axis: 42 degrees
Ventricular Rate: 114 {beats}/min

## 2017-07-26 MED ORDER — HYDRALAZINE 25 MG TAB
25 mg | ORAL | Status: DC
Start: 2017-07-26 — End: 2017-07-26

## 2017-07-26 MED ORDER — SEVELAMER CARBONATE 800 MG TAB
800 mg | Freq: Three times a day (TID) | ORAL | Status: DC
Start: 2017-07-26 — End: 2017-07-26

## 2017-07-26 MED ORDER — SODIUM CHLORIDE 0.9 % IV
10010 mg/mL (10%) | Freq: Once | INTRAVENOUS | Status: DC
Start: 2017-07-26 — End: 2017-07-26

## 2017-07-26 MED ORDER — SEVELAMER 400 MG TAB
400 mg | Freq: Three times a day (TID) | ORAL | Status: DC
Start: 2017-07-26 — End: 2017-07-26

## 2017-07-26 MED ORDER — HYDRALAZINE 25 MG TAB
25 mg | Freq: Three times a day (TID) | ORAL | Status: DC
Start: 2017-07-26 — End: 2017-07-26

## 2017-07-26 MED ORDER — HYDROCODONE-ACETAMINOPHEN 5 MG-325 MG TAB
5-325 mg | ORAL | Status: DC | PRN
Start: 2017-07-26 — End: 2017-07-26

## 2017-07-26 MED ORDER — ONDANSETRON (PF) 4 MG/2 ML INJECTION
4 mg/2 mL | INTRAMUSCULAR | Status: DC | PRN
Start: 2017-07-26 — End: 2017-07-26

## 2017-07-26 MED ORDER — AMLODIPINE 5 MG TAB
5 mg | Freq: Every day | ORAL | Status: DC
Start: 2017-07-26 — End: 2017-07-26

## 2017-07-26 MED ORDER — CARVEDILOL 12.5 MG TAB
12.5 mg | ORAL | Status: DC
Start: 2017-07-26 — End: 2017-07-26

## 2017-07-26 MED ORDER — SODIUM CHLORIDE 0.9 % IJ SYRG
Freq: Three times a day (TID) | INTRAMUSCULAR | Status: DC
Start: 2017-07-26 — End: 2017-07-26

## 2017-07-26 MED ORDER — SODIUM CHLORIDE 0.9 % IJ SYRG
INTRAMUSCULAR | Status: DC | PRN
Start: 2017-07-26 — End: 2017-07-26

## 2017-07-26 MED ORDER — ACETAMINOPHEN 325 MG TABLET
325 mg | ORAL | Status: DC | PRN
Start: 2017-07-26 — End: 2017-07-26

## 2017-07-26 MED ORDER — HEPARIN (PORCINE) 5,000 UNIT/ML IJ SOLN
5000 unit/mL | Freq: Three times a day (TID) | INTRAMUSCULAR | Status: DC
Start: 2017-07-26 — End: 2017-07-26

## 2017-07-26 MED ORDER — NALOXONE 0.4 MG/ML INJECTION
0.4 mg/mL | INTRAMUSCULAR | Status: DC | PRN
Start: 2017-07-26 — End: 2017-07-26

## 2017-07-26 MED ORDER — ONDANSETRON 4 MG TAB, RAPID DISSOLVE
4 mg | ORAL | Status: AC
Start: 2017-07-26 — End: 2017-07-25
  Administered 2017-07-26: 01:00:00 via ORAL

## 2017-07-26 MED ORDER — CALCIUM GLUCONATE 100 MG/ML (10%) IV SOLN
100 mg/mL (10%) | INTRAVENOUS | Status: DC
Start: 2017-07-26 — End: 2017-07-25
  Administered 2017-07-26: 03:00:00 via INTRAVENOUS

## 2017-07-26 MED ORDER — IPRATROPIUM-ALBUTEROL 2.5 MG-0.5 MG/3 ML NEB SOLUTION
2.5 mg-0.5 mg/3 ml | Freq: Four times a day (QID) | RESPIRATORY_TRACT | Status: DC | PRN
Start: 2017-07-26 — End: 2017-07-26
  Administered 2017-07-26: 05:00:00 via RESPIRATORY_TRACT

## 2017-07-26 MED ORDER — FUROSEMIDE 40 MG TAB
40 mg | Freq: Every day | ORAL | Status: DC
Start: 2017-07-26 — End: 2017-07-26

## 2017-07-26 MED ORDER — HYDROMORPHONE (PF) 2 MG/ML IJ SOLN
2 mg/mL | INTRAMUSCULAR | Status: DC
Start: 2017-07-26 — End: 2017-07-25
  Administered 2017-07-26: 03:00:00 via INTRAVENOUS

## 2017-07-26 MED ORDER — AMLODIPINE 5 MG TAB
5 mg | ORAL | Status: DC
Start: 2017-07-26 — End: 2017-07-26

## 2017-07-26 MED ORDER — HYDROMORPHONE (PF) 2 MG/ML IJ SOLN
2 mg/mL | INTRAMUSCULAR | Status: AC
Start: 2017-07-26 — End: 2017-07-25
  Administered 2017-07-26: 02:00:00 via INTRAVENOUS

## 2017-07-26 MED ORDER — DIPHENHYDRAMINE 25 MG CAP
25 mg | ORAL | Status: DC | PRN
Start: 2017-07-26 — End: 2017-07-26

## 2017-07-26 MED ORDER — CARVEDILOL 12.5 MG TAB
12.5 mg | Freq: Two times a day (BID) | ORAL | Status: DC
Start: 2017-07-26 — End: 2017-07-26

## 2017-07-26 MED ORDER — FUROSEMIDE 40 MG TAB
40 mg | ORAL | Status: AC
Start: 2017-07-26 — End: 2017-07-26
  Administered 2017-07-26: 04:00:00 via ORAL

## 2017-07-26 MED FILL — HYDROCODONE-ACETAMINOPHEN 5 MG-325 MG TAB: 5-325 mg | ORAL | Qty: 1

## 2017-07-26 MED FILL — BD POSIFLUSH NORMAL SALINE 0.9 % INJECTION SYRINGE: INTRAMUSCULAR | Qty: 40

## 2017-07-26 MED FILL — IPRATROPIUM-ALBUTEROL 2.5 MG-0.5 MG/3 ML NEB SOLUTION: 2.5 mg-0.5 mg/3 ml | RESPIRATORY_TRACT | Qty: 3

## 2017-07-26 MED FILL — FUROSEMIDE 40 MG TAB: 40 mg | ORAL | Qty: 2

## 2017-07-26 MED FILL — CARVEDILOL 12.5 MG TAB: 12.5 mg | ORAL | Qty: 1

## 2017-07-26 MED FILL — HYDRALAZINE 25 MG TAB: 25 mg | ORAL | Qty: 4

## 2017-07-26 MED FILL — ONDANSETRON 4 MG TAB, RAPID DISSOLVE: 4 mg | ORAL | Qty: 2

## 2017-07-26 MED FILL — AMLODIPINE 5 MG TAB: 5 mg | ORAL | Qty: 2

## 2017-07-26 MED FILL — CALCIUM GLUCONATE 100 MG/ML (10%) IV SOLN: 100 mg/mL (10%) | INTRAVENOUS | Qty: 10

## 2017-07-26 MED FILL — HYDROMORPHONE (PF) 2 MG/ML IJ SOLN: 2 mg/mL | INTRAMUSCULAR | Qty: 1

## 2017-07-26 MED FILL — HEPARIN (PORCINE) 5,000 UNIT/ML IJ SOLN: 5000 unit/mL | INTRAMUSCULAR | Qty: 1

## 2017-07-26 NOTE — Progress Notes (Signed)
I informed the pt that He needed admission, further observation, further lab evaluation,for adequate evaluation for his chest pain, abdominal pain, that by refusing admission, workup, He is at risk for myocardial infarction, arrhythmia or sudden death.  He is awake, alert, He understands his condition and the risks involved in leaving. While the patient has received dilaudid one time , it has been 2 hours since last receiving this medication when having this conversation and is clinically aware of their surroundings and able to ask appropriate questions, the nurse present confirms He is not clinically intoxicated and able to make medical decisions. He verbalized knowing the same risks and understood it was recommended that He stay and could also return at any time. He was provided with warnings regarding worsening of his condition and were provided instructions to follow up with None  Tomorrow or return to the Emergency Room as soon as possible.

## 2017-07-26 NOTE — ED Notes (Signed)
Patient has decided to leave AMA as the physician will not give him IV pain meds. Dr Eulah PontMurphy is notified to fill out AMA form

## 2017-07-26 NOTE — ED Notes (Signed)
Telephonic report given to 5th floor nurse. SBAR, MAR, Vital Signs, Dialysis History, Labs and Plan discussed.

## 2017-07-27 ENCOUNTER — Inpatient Hospital Stay
Admit: 2017-07-27 | Discharge: 2017-07-27 | Discharge status: Against Medical Advice | Payer: MEDICARE | Attending: Emergency Medicine

## 2017-07-27 DIAGNOSIS — R0789 Other chest pain: Secondary | ICD-10-CM

## 2017-07-27 LAB — METABOLIC PANEL, COMPREHENSIVE
A-G Ratio: 0.7 — ABNORMAL LOW (ref 1.1–2.2)
ALT (SGPT): 18 U/L (ref 12–78)
AST (SGOT): 35 U/L (ref 15–37)
Albumin: 2.6 g/dL — ABNORMAL LOW (ref 3.5–5.0)
Alk. phosphatase: 233 U/L — ABNORMAL HIGH (ref 45–117)
Anion gap: 11 mmol/L (ref 5–15)
BUN/Creatinine ratio: 7 — ABNORMAL LOW (ref 12–20)
BUN: 79 MG/DL — ABNORMAL HIGH (ref 6–20)
Bilirubin, total: 0.3 MG/DL (ref 0.2–1.0)
CO2: 24 mmol/L (ref 21–32)
Calcium: 6.3 MG/DL — CL (ref 8.5–10.1)
Chloride: 102 mmol/L (ref 97–108)
Creatinine: 11.51 MG/DL — ABNORMAL HIGH (ref 0.70–1.30)
GFR est AA: 6 mL/min/{1.73_m2} — ABNORMAL LOW (ref >60)
GFR est non-AA: 5 mL/min/{1.73_m2} — ABNORMAL LOW (ref >60)
Globulin: 3.9 g/dL (ref 2.0–4.0)
Glucose: 91 mg/dL (ref 65–100)
Potassium: 5.7 mmol/L — ABNORMAL HIGH (ref 3.5–5.1)
Protein, total: 6.5 g/dL (ref 6.4–8.2)
Sodium: 137 mmol/L (ref 136–145)

## 2017-07-27 LAB — CBC WITH AUTOMATED DIFF
ABS. BASOPHILS: 0.1 10*3/uL (ref 0.0–0.1)
ABS. EOSINOPHILS: 0.8 10*3/uL — ABNORMAL HIGH (ref 0.0–0.4)
ABS. IMM. GRANS.: 0 10*3/uL (ref 0.00–0.04)
ABS. LYMPHOCYTES: 0.3 10*3/uL — ABNORMAL LOW (ref 0.8–3.5)
ABS. MONOCYTES: 0.6 10*3/uL (ref 0.0–1.0)
ABS. NEUTROPHILS: 4.4 10*3/uL (ref 1.8–8.0)
ABSOLUTE NRBC: 0 10*3/uL (ref 0.00–0.01)
BASOPHILS: 1 % (ref 0–1)
EOSINOPHILS: 13 % — ABNORMAL HIGH (ref 0–7)
HCT: 24.6 % — ABNORMAL LOW (ref 36.6–50.3)
HGB: 7.7 g/dL — ABNORMAL LOW (ref 12.1–17.0)
IMMATURE GRANULOCYTES: 0 % (ref 0.0–0.5)
LYMPHOCYTES: 5 % — ABNORMAL LOW (ref 12–49)
MCH: 31.6 PG (ref 26.0–34.0)
MCHC: 31.3 g/dL (ref 30.0–36.5)
MCV: 100.8 FL — ABNORMAL HIGH (ref 80.0–99.0)
MONOCYTES: 9 % (ref 5–13)
MPV: 9.4 FL (ref 8.9–12.9)
NEUTROPHILS: 72 % (ref 32–75)
NRBC: 0 PER 100 WBC
PLATELET: 98 10*3/uL — ABNORMAL LOW (ref 150–400)
RBC: 2.44 M/uL — ABNORMAL LOW (ref 4.10–5.70)
RDW: 17.2 % — ABNORMAL HIGH (ref 11.5–14.5)
WBC: 6.2 10*3/uL (ref 4.1–11.1)

## 2017-07-27 LAB — EKG, 12 LEAD, INITIAL
Atrial Rate: 104 {beats}/min
Calculated P Axis: -3 degrees
Calculated R Axis: -15 degrees
Calculated T Axis: 69 degrees
P-R Interval: 216 ms
Q-T Interval: 364 ms
QRS Duration: 84 ms
QTC Calculation (Bezet): 478 ms
Ventricular Rate: 104 {beats}/min

## 2017-07-27 LAB — CK-MB,QUANT.
CK - MB: 31.9 NG/ML — ABNORMAL HIGH (ref <3.6)
CK-MB Index: 1.1 (ref 0.0–2.5)

## 2017-07-27 LAB — CK W/ REFLX CKMB: CK: 2926 U/L — ABNORMAL HIGH (ref 39–308)

## 2017-07-27 LAB — EKG 12-LEAD
Atrial Rate: 104 {beats}/min
P Axis: -3 degrees
P-R Interval: 216 ms
Q-T Interval: 364 ms
QRS Duration: 84 ms
QTc Calculation (Bazett): 478 ms
R Axis: -15 degrees
T Axis: 69 degrees
Ventricular Rate: 104 {beats}/min

## 2017-07-27 MED ORDER — ONDANSETRON (PF) 4 MG/2 ML INJECTION
4 mg/2 mL | INTRAMUSCULAR | Status: AC
Start: 2017-07-27 — End: 2017-07-27
  Administered 2017-07-27: 15:00:00 via INTRAVENOUS

## 2017-07-27 MED ORDER — ACETAMINOPHEN 500 MG TAB
500 mg | ORAL | Status: DC
Start: 2017-07-27 — End: 2017-07-27

## 2017-07-27 MED ORDER — NITROGLYCERIN 0.4 MG SUBLINGUAL TAB
0.4 mg | SUBLINGUAL | Status: DC | PRN
Start: 2017-07-27 — End: 2017-07-27

## 2017-07-27 MED FILL — ONDANSETRON (PF) 4 MG/2 ML INJECTION: 4 mg/2 mL | INTRAMUSCULAR | Qty: 2

## 2017-07-27 NOTE — ED Provider Notes (Signed)
EMERGENCY DEPARTMENT HISTORY AND PHYSICAL EXAM    Date: 07/27/2017  Patient Name: David [name]    History of Presenting Illness     Chief Complaint   Patient presents with   ??? Chest Pain   ??? Diarrhea         History Provided By: Patient    HPI: David [name] is a 47 y.o. male with a PMH of C. difficile, ESRD with dialysis on Monday Wednesday Friday, OSA, HTN, asthma, CAD, COPD, DM, oxygen dependent on 3-4 L who presents with complaints of chest pain and arm swelling.  Patient states he was on his way out for jaw today when he started getting sharp pains in his chest.  Patient states that his arms just became swollen today.  Patient reports pain 10 out of 10 and requesting pain medicines and something for nausea.  Patient states he did get dialysis on Monday and is due for dialysis at 5 PM today.  Patient frequents the ER and was just admitted at [facility] on 4/8 but was also seen at [facility] on 4/5 after being transferred from this facility patient has been to multiple other hospitals within the past month as well all with the same complaint.  Patient was just seen at [facility] this morning for the same.  Patient states he thinks his C. difficile has come back as he is started to have diarrhea which was also his complaints 2 days ago when admitted to the hospital.  Patient reports getting abdomen drained every 2 weeks in [place].    PCP: None    Current Outpatient Medications   Medication Sig Dispense Refill   ??? ipratropium-albuterol (COMBIVENT RESPIMAT) 20-100 mcg/actuation inhaler Take 1 Puff by inhalation every six (6) hours.     ??? sod phos di, mono/K phos mono (PHOSPHOROUS PO) Take  by mouth.     ??? albuterol (PROVENTIL VENTOLIN) 2.5 mg /3 mL (0.083 %) nebulizer solution by Nebulization route once.     ??? amLODIPine (NORVASC) 10 mg tablet Take 10 mg by mouth daily.     ??? sevelamer carbonate (RENVELA) 800 mg tab tab Take 800 mg by mouth three (3) times daily (with meals).      ??? sevelamer (RENAGEL) 400 mg tablet Take 800 mg by mouth three (3) times daily (with meals).     ??? carvedilol (COREG) 12.5 mg tablet Take 12.5 mg by mouth two (2) times daily (with meals).     ??? hydrALAZINE (APRESOLINE) 100 mg tablet Take 100 mg by mouth three (3) times daily.     ??? albuterol (PROVENTIL HFA, VENTOLIN HFA, PROAIR HFA) 90 mcg/actuation inhaler Take 2 Puffs by inhalation every six (6) hours as needed for Wheezing for up to 7 days. 1 Inhaler 1   ??? furosemide (LASIX) 80 mg tablet Take 80 mg by mouth daily.         Past History     Past Medical History:  Past Medical History:   Diagnosis Date   ??? Arthritis    ??? Ascites    ??? Asthma    ??? C. difficile colitis    ??? CAD (coronary artery disease)    ??? Chronic obstructive pulmonary disease ([place])    ??? Chronic respiratory failure with hypoxia ([place])     on 3-4L n/c at home.   ??? 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 ESRD (end stage renal disease) on dialysis [facility])    ???  HTN (hypertension)    ??? OSA (obstructive sleep apnea)     reports does not have cpap       Past Surgical History:  Past Surgical History:   Procedure Laterality Date   ??? ABDOMEN SURGERY PROC UNLISTED      umbilical hernia and right groin hernia 2016   ??? COLONOSCOPY N/A 03/04/2016    COLONOSCOPY performed by Macie Burows, MD at [facility]   ??? HX CORONARY STENT PLACEMENT     ??? HX CORONARY STENT PLACEMENT  2010   ??? HX GI      Groin & Hernia repair   ??? HX HERNIA REPAIR  2008    right inguinal and umbilical   ??? HX VASCULAR ACCESS      Hx of one site on R, 2 on L, currently dialyzing through L Tunnel cath.   ??? HX VASCULAR ACCESS      PD Catheter placed and removed   ??? SIGMOIDOSCOPY,DIAGNOSTIC  03/04/2016            Family History:  Family History   Problem Relation Age of Onset   ??? Hypertension Other    ??? Hypertension Mother    ??? Kidney Disease Neg Hx    ??? Liver Disease Neg Hx        Social History:  Social History     Tobacco Use   ??? Smoking status: Current Some Day Smoker    ??? Smokeless tobacco: Never Used   ??? Tobacco comment: 3 cigs a day   Substance Use Topics   ??? Alcohol use: Never     Frequency: Never   ??? Drug use: No       Allergies:  Allergies   Allergen Reactions   ??? Lisinopril Anaphylaxis   ??? Morphine Anaphylaxis   ??? Toradol [Ketorolac] Anaphylaxis   ??? Lisinopril Shortness of Breath   ??? Lisinopril Swelling   ??? Lisinopril Shortness of Breath and Angioedema   ??? Lisinopril Other (comments)     "messes with my breathing"   ??? Morphine Swelling   ??? Morphine Shortness of Breath and Angioedema   ??? Morphine Unknown (comments)   ??? Percocet [Oxycodone-Acetaminophen] Anxiety   ??? Toradol [Ketorolac] Shortness of Breath   ??? Toradol [Ketorolac] Swelling   ??? Toradol [Ketorolac] Shortness of Breath and Angioedema   ??? Toradol [Ketorolac] Other (comments)     "messes with my breathing"     ??? Toradol [Ketorolac] Unknown (comments)   ??? Tramadol Shortness of Breath         Review of Systems   Review of Systems   Cardiovascular: Positive for chest pain.   Gastrointestinal: Positive for abdominal pain and nausea.   Musculoskeletal: Positive for joint swelling and myalgias.   Skin: Positive for color change.   Neurological: Negative for speech difficulty and weakness.   All other systems reviewed and are negative.      Physical Exam     Vitals:    07/27/17 0938 07/27/17 1027   BP: (!) 217/99 179/87   Pulse: (!) 104 (!) 101   Resp: 24 26   Temp: 98.1 ??F (36.7 ??C)    SpO2: 100% 100%   Weight: 111.1 kg (245 lb)    Height: '6\' 1"'  (1.854 m)      Physical Exam   Constitutional: He is oriented to person, place, and time. He appears well-developed and well-nourished. No distress.   HENT:   Head: Normocephalic and atraumatic.   Mouth/Throat:  Oropharynx is clear and moist. No oropharyngeal exudate.   Eyes: Conjunctivae are normal.   Neck: Normal range of motion.   Cardiovascular: Normal rate, regular rhythm and normal heart sounds.   Pulmonary/Chest: Effort normal. No respiratory distress. He has wheezes in  the right upper field, the right middle field, the right lower field, the left upper field, the left middle field and the left lower field. He has no rales.   + Port to the left chest   Abdominal: He exhibits distension.   + Anasarca   Musculoskeletal: Normal range of motion.        Right upper arm: He exhibits edema.        Left upper arm: He exhibits edema.        Right forearm: He exhibits edema.        Left forearm: He exhibits edema.   Neurological: He is alert and oriented to person, place, and time.   Skin: Skin is warm and dry. He is not diaphoretic.   Nursing note and vitals reviewed.  at 5:02 PM        Diagnostic Study Results     Labs -     Recent Results (from the past 12 hour(s))   EKG, 12 LEAD, INITIAL    Collection Time: 07/27/17  9:45 AM   Result Value Ref Range    Ventricular Rate 104 BPM    Atrial Rate 104 BPM    P-R Interval 216 ms    QRS Duration 84 ms    Q-T Interval 364 ms    QTC Calculation (Bezet) 478 ms    Calculated P Axis -3 degrees    Calculated R Axis -15 degrees    Calculated T Axis 69 degrees    Diagnosis       Sinus tachycardia with 1st degree AV block  Low voltage QRS  delayed precordial R wave progression  no significant interval change  Confirmed by Claiborne Billings MD, Legrand Como [phone]) on 07/27/2017 15:17:61 PM     METABOLIC PANEL, COMPREHENSIVE    Collection Time: 07/27/17 10:43 AM   Result Value Ref Range    Sodium 137 136 - 145 mmol/L    Potassium 5.7 (H) 3.5 - 5.1 mmol/L    Chloride 102 97 - 108 mmol/L    CO2 24 21 - 32 mmol/L    Anion gap 11 5 - 15 mmol/L    Glucose 91 65 - 100 mg/dL    BUN 79 (H) 6 - 20 MG/DL    Creatinine 11.51 (H) 0.70 - 1.30 MG/DL    BUN/Creatinine ratio 7 (L) 12 - 20      GFR est AA 6 (L) >60 ml/min/1.90m    GFR est non-AA 5 (L) >60 ml/min/1.774m   Calcium 6.3 (LL) 8.5 - 10.1 MG/DL    Bilirubin, total 0.3 0.2 - 1.0 MG/DL    ALT (SGPT) 18 12 - 78 U/L    AST (SGOT) 35 15 - 37 U/L    Alk. phosphatase 233 (H) 45 - 117 U/L    Protein, total 6.5 6.4 - 8.2 g/dL     Albumin 2.6 (L) 3.5 - 5.0 g/dL    Globulin 3.9 2.0 - 4.0 g/dL    A-G Ratio 0.7 (L) 1.1 - 2.2     CBC WITH AUTOMATED DIFF    Collection Time: 07/27/17 10:43 AM   Result Value Ref Range    WBC 6.2 4.1 - 11.1 K/uL    RBC 2.44 (L)  4.10 - 5.70 M/uL    HGB 7.7 (L) 12.1 - 17.0 g/dL    HCT 24.6 (L) 36.6 - 50.3 %    MCV 100.8 (H) 80.0 - 99.0 FL    MCH 31.6 26.0 - 34.0 PG    MCHC 31.3 30.0 - 36.5 g/dL    RDW 17.2 (H) 11.5 - 14.5 %    PLATELET 98 (L) 150 - 400 K/uL    MPV 9.4 8.9 - 12.9 FL    NRBC 0.0 0 PER 100 WBC    ABSOLUTE NRBC 0.00 0.00 - 0.01 K/uL    NEUTROPHILS 72 32 - 75 %    LYMPHOCYTES 5 (L) 12 - 49 %    MONOCYTES 9 5 - 13 %    EOSINOPHILS 13 (H) 0 - 7 %    BASOPHILS 1 0 - 1 %    IMMATURE GRANULOCYTES 0 0.0 - 0.5 %    ABS. NEUTROPHILS 4.4 1.8 - 8.0 K/UL    ABS. LYMPHOCYTES 0.3 (L) 0.8 - 3.5 K/UL    ABS. MONOCYTES 0.6 0.0 - 1.0 K/UL    ABS. EOSINOPHILS 0.8 (H) 0.0 - 0.4 K/UL    ABS. BASOPHILS 0.1 0.0 - 0.1 K/UL    ABS. IMM. GRANS. 0.0 0.00 - 0.04 K/UL    DF SMEAR SCANNED      RBC COMMENTS NORMOCYTIC, NORMOCHROMIC     TROPONIN I    Collection Time: 07/27/17 10:43 AM   Result Value Ref Range    Troponin-I, Qt. 0.12 (H) <0.05 ng/mL   CK W/ REFLX CKMB    Collection Time: 07/27/17 10:43 AM   Result Value Ref Range    CK 2,926 (H) 39 - 308 U/L   CK-MB,QUANT.    Collection Time: 07/27/17 10:43 AM   Result Value Ref Range    CK - MB 31.9 (H) <3.6 NG/ML    CK-MB Index 1.1 0.0 - 2.5         Radiologic Studies -   No orders to display     CT Results  (Last 48 hours)               07/25/17 2142  CT ABD PELV WO CONT Final result    Impression:  IMPRESSION:   Trace bilateral effusions. Large volume ascites. Subcutaneous edema. Findings   are related to third spacing of fluids. Minimal pulmonary edema. No acute   intra-abdominal abnormality. Tiny bilateral pulmonary nodules.       Guidelines for Management of Incidental Pulmonary Nodules Detected on CT Images:   from the Fleischner Society 2017       LOW-RISK PATIENTS:        LESS THAN OR EQUAL TO 6 MM:  No routine follow-up needed.   GREATER THAN 6-8 MM: CT at 6-12 months, if multiple at 3-6 months, then consider   CT at 18-24 months.   GREATER THAN 8 MM:  Consider CT at 3 months, PET/CT, or tissue sampling. If   multiple CT at 3-6 months, then consider CT at 18-24 months.           HIGH-RISK PATIENTS:       LESS THAN OR EQUAL TO 6 MM:  Optional CT at 12 months.   GREATER THAN 6-8 MM: CT at 6-12 months, if multiple at 3-6 months, then CT at   18-24 months.   GREATER THAN 8 MM:  Consider CT at 3 months, PET/CT, or tissue sampling. If   multiple CT at 3-6  months, then at 18-24 months.       Guidelines for Management of Incidental Pulmonary Nodules Detected on CT : A   Statement from the Fleischner Society 2017"  [address], [place]. Radiology   2017; 000:1-16               Narrative:  EXAM: CT CHEST WO CONT, CT ABD PELV WO CONT       INDICATION: Chest and abdominal pain today, diarrhea       COMPARISON: Abdomen CT 05/01/2017, chest CT 07/21/2016       CONTRAST: 100 mL of Isovue-370.       TECHNIQUE:    Following the uneventful intravenous administration of contrast, thin axial   images were obtained through the chest, abdomen and pelvis. Coronal and sagittal   reconstructions were generated. Oral contrast was not administered. CT dose   reduction was achieved through use of a standardized protocol tailored for this   examination and automatic exposure control for dose modulation.       FINDINGS:        THYROID: No nodule.   MEDIASTINUM: Prominent mediastinal lymph nodes are noted but have improved   compared to the prior exam.   HILA: No mass or lymphadenopathy.   THORACIC AORTA: No dissection or aneurysm.   MAIN PULMONARY ARTERY: Normal in caliber.   TRACHEA/BRONCHI: Patent.   ESOPHAGUS: No wall thickening or dilatation.   HEART: Four-chamber enlargement is noted. There is a small pericardial effusion.   PLEURA: There are trace bilateral pleural effusions.    LUNGS: Tiny bilateral pulmonary nodules are noted, with a reference 4 mm nodule   in the posterior segment of the right upper lobe. No focal infiltrate. Minimal   pulmonary edema is noted.   LIVER: No mass or biliary dilatation.   GALLBLADDER: Not well evaluated due to free fluid.   SPLEEN: No mass.   PANCREAS: No mass or ductal dilatation.   ADRENALS: Unremarkable.   KIDNEYS: Markedly atrophic.   STOMACH: Unremarkable.   SMALL BOWEL: No dilatation or wall thickening.   COLON: No dilatation or wall thickening.   APPENDIX: Unremarkable.   PERITONEUM: Large volume ascites is noted.   RETROPERITONEUM: No lymphadenopathy or aortic aneurysm.   REPRODUCTIVE ORGANS: There is no pelvic mass or lymphadenopathy.   URINARY BLADDER: No mass or calculus.   BONES: No destructive bone lesion.   ADDITIONAL COMMENTS: Subcutaneous edema is evident. Left jugular catheter tip   projects in the suprahepatic IVC.           07/25/17 2142  CT CHEST WO CONT Final result    Impression:  IMPRESSION:   Trace bilateral effusions. Large volume ascites. Subcutaneous edema. Findings   are related to third spacing of fluids. Minimal pulmonary edema. No acute   intra-abdominal abnormality. Tiny bilateral pulmonary nodules.       Guidelines for Management of Incidental Pulmonary Nodules Detected on CT Images:   from the Fleischner Society 2017       LOW-RISK PATIENTS:       LESS THAN OR EQUAL TO 6 MM:  No routine follow-up needed.   GREATER THAN 6-8 MM: CT at 6-12 months, if multiple at 3-6 months, then consider   CT at 18-24 months.   GREATER THAN 8 MM:  Consider CT at 3 months, PET/CT, or tissue sampling. If   multiple CT at 3-6 months, then consider CT at 18-24 months.  HIGH-RISK PATIENTS:       LESS THAN OR EQUAL TO 6 MM:  Optional CT at 12 months.   GREATER THAN 6-8 MM: CT at 6-12 months, if multiple at 3-6 months, then CT at   18-24 months.   GREATER THAN 8 MM:  Consider CT at 3 months, PET/CT, or tissue sampling. If    multiple CT at 3-6 months, then at 18-24 months.       Guidelines for Management of Incidental Pulmonary Nodules Detected on CT : A   Statement from the Fleischner Society 2017"  [address], [place]. Radiology   2017; 000:1-16               Narrative:  EXAM: CT CHEST WO CONT, CT ABD PELV WO CONT       INDICATION: Chest and abdominal pain today, diarrhea       COMPARISON: Abdomen CT 05/01/2017, chest CT 07/21/2016       CONTRAST: 100 mL of Isovue-370.       TECHNIQUE:    Following the uneventful intravenous administration of contrast, thin axial   images were obtained through the chest, abdomen and pelvis. Coronal and sagittal   reconstructions were generated. Oral contrast was not administered. CT dose   reduction was achieved through use of a standardized protocol tailored for this   examination and automatic exposure control for dose modulation.       FINDINGS:        THYROID: No nodule.   MEDIASTINUM: Prominent mediastinal lymph nodes are noted but have improved   compared to the prior exam.   HILA: No mass or lymphadenopathy.   THORACIC AORTA: No dissection or aneurysm.   MAIN PULMONARY ARTERY: Normal in caliber.   TRACHEA/BRONCHI: Patent.   ESOPHAGUS: No wall thickening or dilatation.   HEART: Four-chamber enlargement is noted. There is a small pericardial effusion.   PLEURA: There are trace bilateral pleural effusions.   LUNGS: Tiny bilateral pulmonary nodules are noted, with a reference 4 mm nodule   in the posterior segment of the right upper lobe. No focal infiltrate. Minimal   pulmonary edema is noted.   LIVER: No mass or biliary dilatation.   GALLBLADDER: Not well evaluated due to free fluid.   SPLEEN: No mass.   PANCREAS: No mass or ductal dilatation.   ADRENALS: Unremarkable.   KIDNEYS: Markedly atrophic.   STOMACH: Unremarkable.   SMALL BOWEL: No dilatation or wall thickening.   COLON: No dilatation or wall thickening.   APPENDIX: Unremarkable.   PERITONEUM: Large volume ascites is noted.    RETROPERITONEUM: No lymphadenopathy or aortic aneurysm.   REPRODUCTIVE ORGANS: There is no pelvic mass or lymphadenopathy.   URINARY BLADDER: No mass or calculus.   BONES: No destructive bone lesion.   ADDITIONAL COMMENTS: Subcutaneous edema is evident. Left jugular catheter tip   projects in the suprahepatic IVC.               CXR Results  (Last 48 hours)    None            Medical Decision Making   I am the first provider for this patient.    I reviewed the vital signs, available nursing notes, past medical history, past surgical history, family history and social history.    Vital Signs-Reviewed the patient's vital signs.    Records Reviewed: Old Medical Records, Previous Radiology Studies and Previous Laboratory Studies    ED Course as of Jul 27 1700  Wed Jul 27, 2017   1104 Requesting pain medicines for his chest pain and arm pain.  Patient was ordered Tylenol and nitro for chest pain.  Patient cursing at nurse stating he does not want those medicines and he would like to speak to MD.    [AH]   1109 Attending, Dr. Leotis Shames, went in to see patient and he now wants to leave AMA.    [AH]      ED Course User Index  [AH] Chester Holstein, PA-C          Disposition:  AMA      DISCHARGE NOTE:   12 am   attending, Dr. Leotis Shames, informed the pt that He needed further observation, further lab evaluation, etc) for adequate evaluation for his chest painand  that by refusing workup He is at risk for myocardial infarction, arrhythmia or sudden death and further deterioration.  He is awake, alert, He understands his condition and the risks involved in leaving. While the patient has received no narcotics due to his management plan which is the reason he wants to leave he is clinically aware of their surroundings and able to ask appropriate questions and the nurse present confirms He is not clinically intoxicated and able to make medical decisions. He verbalized knowing the same risks and understood it was  recommended that He stay and could also return at any time.   Patient was provided with warnings regarding worsening of his condition. This discussion was witnessed by nurse Melvenia Beam.???        Follow-up Information    None         Discharge Medication List as of 07/27/2017 11:24 AM          Provider Notes (Medical Decision Making):   DDX: Malingering, substance abuse, atypical chest pain, CAD, need for dialysis      Procedures        Diagnosis     Clinical Impression:   1. Other chest pain    2. Anasarca associated with disorder of kidney    3. ESRD (end stage renal disease) on dialysis ([place])    4. H/O noncompliance with medical treatment, presenting hazards to health

## 2017-07-27 NOTE — Progress Notes (Signed)
Pt left ED against medical advice and CM was unable to see him to complete an assessment.

## 2017-07-27 NOTE — ED Notes (Cosign Needed)
MD @ bedside speaking with patient.

## 2017-07-27 NOTE — ED Notes (Signed)
Pt requesting to leave, IV removed and pt taken via wheelchair to restroom then waiting room

## 2017-07-27 NOTE — ED Notes (Signed)
Pt requesting to speak with physician instead of PA

## 2017-07-27 NOTE — ED Notes (Signed)
@   2238 received call from Wesley ChapelNguyen in the lab regarding a critical lab value: Troponin of 0.12.

## 2017-07-27 NOTE — ED Notes (Addendum)
Zofran given. Provider ordered NTG and tylenol for pain. Pt. Yells, "I don't want no fucking tylenol; that shit will not work. I want to speak with doctor" MD notified.

## 2017-07-27 NOTE — Progress Notes (Signed)
CM attempted to see patient in ED.  He refused to meet with meet with CM, stating "it is not a good time".  CM will attempt again at a later time.

## 2017-07-27 NOTE — ED Notes (Cosign Needed Addendum)
Pt. c/o chest pain radiating down left arm accompanied by SOB, fatigued, bilateral upper extremity swelling, and ascites since this morning; abdominal pain with N/V/D x 5 days.  Pt. Reports worsening symptoms this morning when he started to walk outside. Pt. States, "I woke up this morning to go for a walk when the chest pain hit me. I had to sit down to catch my breath." Pt. Reports h/o hypertension, asthma, renal failure, and CAD. Denies fever, chills, headache, and numbness.    Emergency Department Nursing Plan of Care       The Nursing Plan of Care is developed from the Nursing assessment and Emergency Department Attending provider initial evaluation.  The plan of care may be reviewed in the ???ED Provider note???.    The Plan of Care was developed with the following considerations:   Patient / Family readiness to learn indicated VW:[id number]:verbalized understanding  Persons(s) to be included in education: patient  Barriers to Learning/Limitations:No    Signed     March RummageGeorge Johnson V    07/27/2017   10:13 AM

## 2017-07-27 NOTE — ED Notes (Addendum)
Pt. Reported that he wanted to speak with the provider regarding pain medication before Zofran was given. Provider notified.

## 2017-07-28 LAB — TROPONIN I: Troponin-I, Qt.: 0.12 ng/mL — ABNORMAL HIGH (ref <0.05)

## 2017-08-02 NOTE — Other (Signed)
UPDATED Patient Management Plan      Pt Name: David [name] DOB:1970/05/31      Management Plan by: [facility] TEAM                                                                                           Date Placed:  28-Jun-2017   Pt's Physicians:         Primary Care:  None                  Summary    Reason for Referral: This patient has been provided a management plan for Radiation Limitation, Medical Needs and Psychiatric Needs     Patient with frequent visits for: CP, Abdominal pain (recurrent C.Diff), dialysis s/p non-compliance (Dialysis center in [name] no longer accepts patient).    Warning/Safety Alert:     Frequently leaves AMA when not given IV Narcotics and is easily agitated. Consider having security/PD nearby    Pt has visited 6261 different facilities x 1 year     PMP not significant for controlled substances since 08/2016    Situation: Chronic Conditions Summary ???     Root Cause Medical Problems List:  ESRD w/ dialysis, Cirrhosis, C. Diff (recurrent), MI, HTN, Non-compliance to tx (ie dialysis, completing meds for C. Diff)  Root Cause Psychosocial Problems List: SUD  Root Cause Social Determinants of Health (SDOH): On disability, has own transportation, Unknown living situation (pt previously lived w/ sister)    Incidence of Testing: Over past 12 months: +100 XR, +35 CT???s  Pattern of Access:  July 27, 2016-July 27, 2017: +226 visits and admissions between 61 different facilities       Sporadic times ??? Early am generally, several evening times.    Goals/Interventions:     ? ED Provider/nursing(together) to discuss mgmt. plan w/ pt (document)  ? Please review prior imaging; he is at high risk of overexposure  ? Nursing/CM obtain updated list of current providers and attempt to obtain ROI for coordination of care  ? Consider non-narcotic medications/alternatives to benzodiazepines unless emergently necessary.   ? Offer pt resource: [facility] [employer] LINE   [phone]) 8 am - midnight 7 days/week  ? Consider discussing barriers and express concern of over 200 ED visits and frequently signing out AMA.  ? Consider Palliative consult  ? CM/Nursing/Provider strongly encourage to Contact Medicare for verification of insurance??    During Business Hours: CM   After Hours: BSMART(as warranted)     Challenges for Self Management:     ? Impairment of cognition and/or functional limitations: unknown  ? Financial Constraints: Unknown  ? Utilization: Extremely high ED visits and admissions that result in patient signing out AMA!!!  ? Emotional status of Patient: Unknown  ? Behavioral Health Factors:  Easily agitated when not given IV narcotics  ? Motivational level: Likely lowTransportation:  Drives self or uses EMS    Medication Management: Complications from poor adherence, Poor Adherence, Poly-pharm    Advanced Care Plan: Advanced directive on file            ** This plan has been created  by the Care Coordination Committee, a multi-disciplinary team. The patient and their physician were invited to participate in this plan. This management plan is intended to provide consistent evaluation and treatment for this patient not to supersede physician judgment.**

## 2017-08-17 DEATH — deceased

## 2017-08-31 LAB — EKG, 12 LEAD, SUBSEQUENT
Atrial Rate: 102 {beats}/min
Calculated P Axis: -3 degrees
Calculated R Axis: 10 degrees
Calculated T Axis: 63 degrees
P-R Interval: 188 ms
Q-T Interval: 386 ms
QRS Duration: 92 ms
QTC Calculation (Bezet): 503 ms
Ventricular Rate: 102 {beats}/min

## 2017-08-31 LAB — EKG, 12 LEAD, INITIAL
Atrial Rate: 105 {beats}/min
Calculated P Axis: -6 degrees
Calculated R Axis: 7 degrees
Calculated T Axis: 65 degrees
P-R Interval: 188 ms
Q-T Interval: 352 ms
QRS Duration: 86 ms
QTC Calculation (Bezet): 465 ms
Ventricular Rate: 105 {beats}/min

## 2017-08-31 LAB — EKG 12-LEAD
Atrial Rate: 102 {beats}/min
Atrial Rate: 105 {beats}/min
P Axis: -3 degrees
P Axis: -6 degrees
P-R Interval: 188 ms
P-R Interval: 188 ms
Q-T Interval: 352 ms
Q-T Interval: 386 ms
QRS Duration: 86 ms
QRS Duration: 92 ms
QTc Calculation (Bazett): 465 ms
QTc Calculation (Bazett): 503 ms
R Axis: 10 degrees
R Axis: 7 degrees
T Axis: 63 degrees
T Axis: 65 degrees
Ventricular Rate: 102 {beats}/min
Ventricular Rate: 105 {beats}/min

## 2018-02-26 LAB — URINALYSIS WITH MICROSCOPIC REFLEX IF INDICATED BMC/JMC ONLY: SM ANTIBODIES, IGG, QUANTITATIVE: 9 mg/dL — AB (ref <150)

## 2018-02-27 LAB — HEPATIC FUNCTION PANEL: HCG URINE QUALITATIVE: 33 U/L (ref 8–48)

## 2018-08-23 LAB — URINALYSIS, MACRO/MICRO: PRESUMPTIVE 027/NAP1/BI: POSITIVE — AB

## 2018-11-16 IMAGING — CR DG CHEST 2V
1 series · 2 of 2 positions shown · non-contrast
Comparison: None.

CLINICAL DATA: Patient with left-sided chest pain.

EXAM:
CHEST  2 VIEW

[Series 1: dg chest 2 view · 0.14mm/px · 2 of 2 slices shown]
[im 1/2]
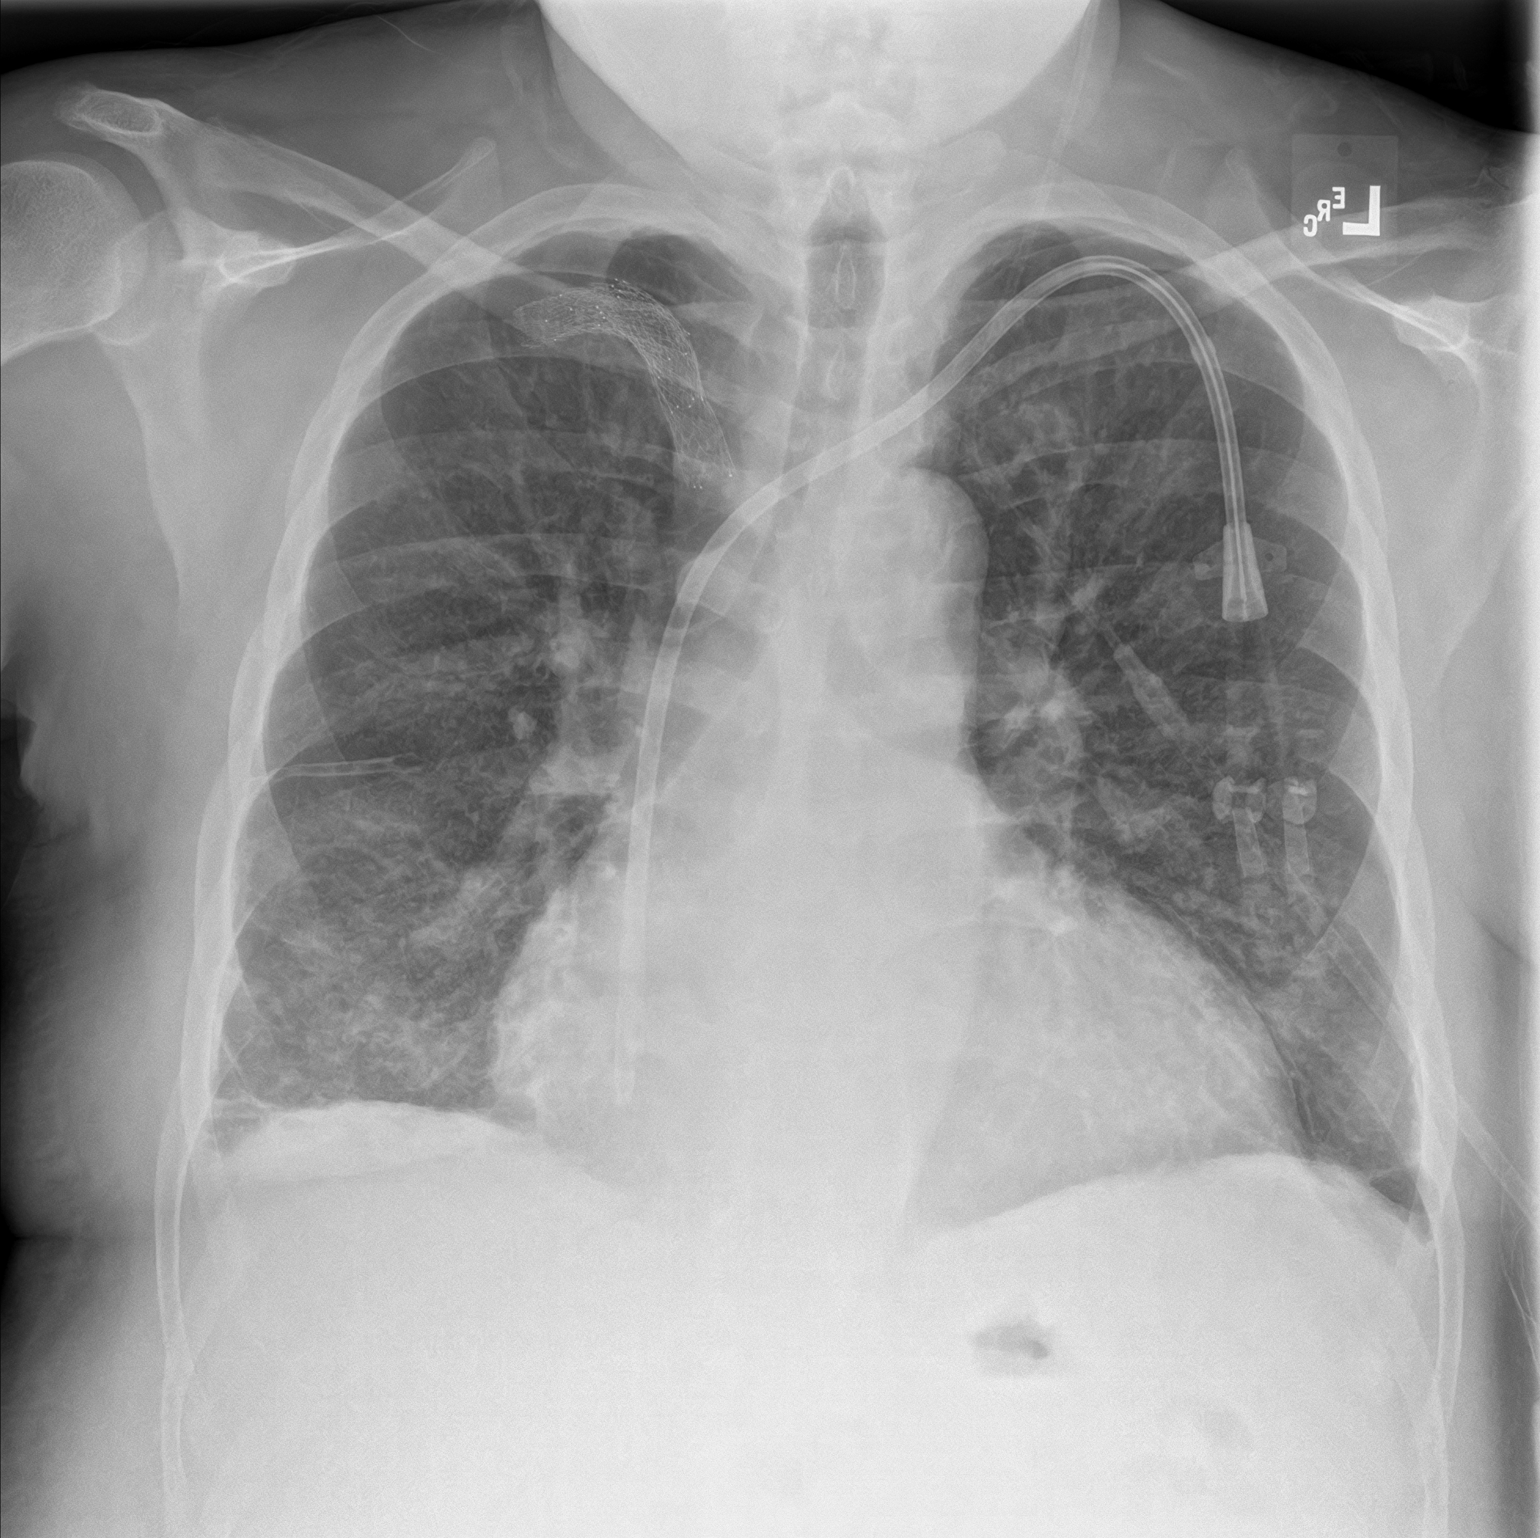
[im 2/2]
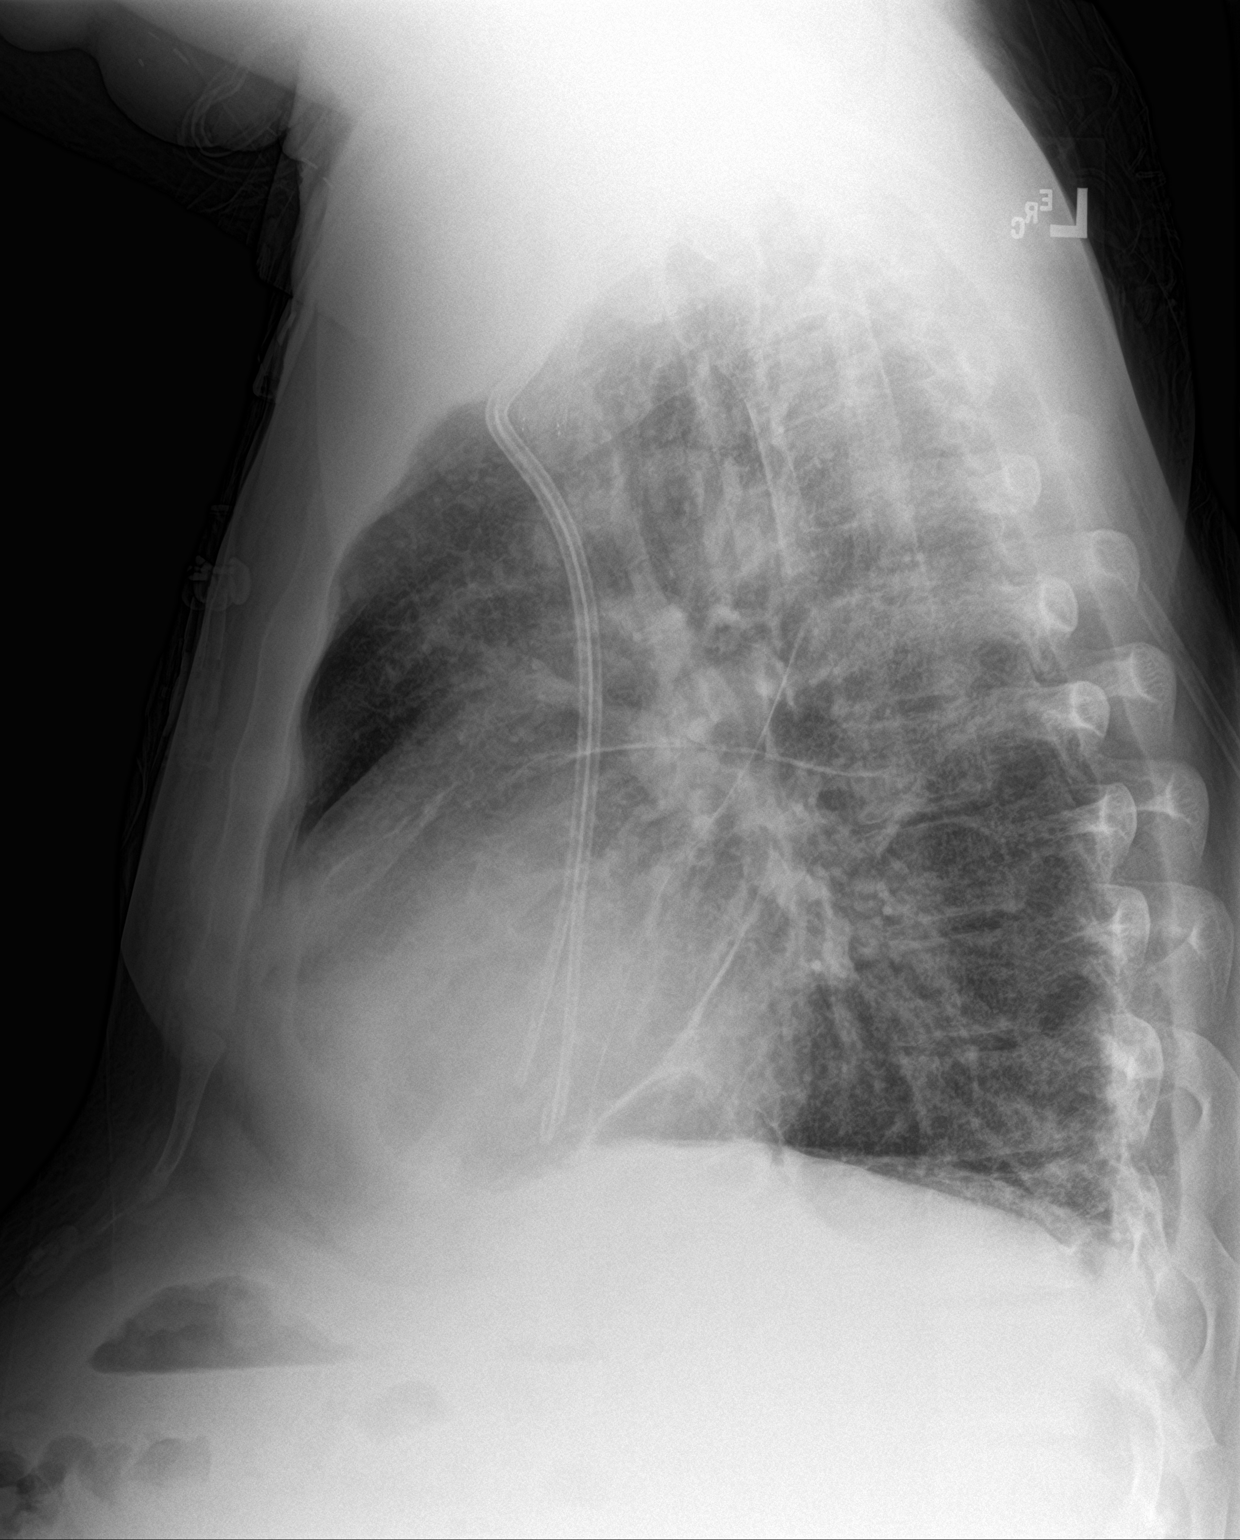

[2 of 2 positions shown; findings below may reference images not displayed]

FINDINGS: Enlarged cardiac and mediastinal contours. Stent within the right
upper hemithorax. Central venous catheter tip projects over the
right atrium. Bibasilar heterogeneous pulmonary opacities. Pulmonary
vascular redistribution. Thoracic spine degenerative changes.
IMPRESSION: Cardiomegaly.

Bibasilar heterogeneous opacities favored to represent atelectasis
and/or scarring.

Mild pulmonary vascular redistribution.
